# Patient Record
Sex: Female | Born: 1975
Health system: Southern US, Community
[De-identification: ages and names within clinical notes are randomized; demographics above are authoritative.]

## PROBLEM LIST (undated history)

## (undated) ENCOUNTER — Inpatient Hospital Stay (HOSPITAL_COMMUNITY): Payer: Medicaid Other

## (undated) ENCOUNTER — Inpatient Hospital Stay (HOSPITAL_COMMUNITY): Payer: Self-pay

## (undated) DIAGNOSIS — I82409 Acute embolism and thrombosis of unspecified deep veins of unspecified lower extremity: Secondary | ICD-10-CM

## (undated) DIAGNOSIS — E669 Obesity, unspecified: Secondary | ICD-10-CM

## (undated) DIAGNOSIS — R109 Unspecified abdominal pain: Secondary | ICD-10-CM

## (undated) DIAGNOSIS — I4729 Other ventricular tachycardia: Secondary | ICD-10-CM

## (undated) DIAGNOSIS — I2510 Atherosclerotic heart disease of native coronary artery without angina pectoris: Secondary | ICD-10-CM

## (undated) DIAGNOSIS — I5020 Unspecified systolic (congestive) heart failure: Secondary | ICD-10-CM

## (undated) DIAGNOSIS — R0902 Hypoxemia: Secondary | ICD-10-CM

## (undated) DIAGNOSIS — G4730 Sleep apnea, unspecified: Secondary | ICD-10-CM

## (undated) DIAGNOSIS — D649 Anemia, unspecified: Secondary | ICD-10-CM

## (undated) DIAGNOSIS — Z95811 Presence of heart assist device: Secondary | ICD-10-CM

## (undated) DIAGNOSIS — L732 Hidradenitis suppurativa: Secondary | ICD-10-CM

## (undated) DIAGNOSIS — I10 Essential (primary) hypertension: Secondary | ICD-10-CM

## (undated) DIAGNOSIS — G4733 Obstructive sleep apnea (adult) (pediatric): Secondary | ICD-10-CM

## (undated) DIAGNOSIS — S82892A Other fracture of left lower leg, initial encounter for closed fracture: Secondary | ICD-10-CM

## (undated) DIAGNOSIS — D219 Benign neoplasm of connective and other soft tissue, unspecified: Secondary | ICD-10-CM

## (undated) DIAGNOSIS — I428 Other cardiomyopathies: Secondary | ICD-10-CM

## (undated) DIAGNOSIS — I2699 Other pulmonary embolism without acute cor pulmonale: Secondary | ICD-10-CM

## (undated) DIAGNOSIS — Z5189 Encounter for other specified aftercare: Secondary | ICD-10-CM

## (undated) DIAGNOSIS — I219 Acute myocardial infarction, unspecified: Secondary | ICD-10-CM

## (undated) DIAGNOSIS — Z9989 Dependence on other enabling machines and devices: Secondary | ICD-10-CM

## (undated) DIAGNOSIS — Z3490 Encounter for supervision of normal pregnancy, unspecified, unspecified trimester: Secondary | ICD-10-CM

## (undated) DIAGNOSIS — I340 Nonrheumatic mitral (valve) insufficiency: Secondary | ICD-10-CM

## (undated) DIAGNOSIS — I5042 Chronic combined systolic (congestive) and diastolic (congestive) heart failure: Secondary | ICD-10-CM

## (undated) DIAGNOSIS — D689 Coagulation defect, unspecified: Secondary | ICD-10-CM

## (undated) DIAGNOSIS — F17200 Nicotine dependence, unspecified, uncomplicated: Secondary | ICD-10-CM

## (undated) HISTORY — DX: Essential (primary) hypertension: I10

## (undated) HISTORY — DX: Hidradenitis suppurativa: L73.2

## (undated) HISTORY — DX: Nonrheumatic mitral (valve) insufficiency: I34.0

## (undated) HISTORY — DX: Encounter for other specified aftercare: Z51.89

## (undated) HISTORY — DX: Acute myocardial infarction, unspecified: I21.9

## (undated) HISTORY — DX: Unspecified abdominal pain: R10.9

## (undated) HISTORY — DX: Atherosclerotic heart disease of native coronary artery without angina pectoris: I25.10

## (undated) HISTORY — DX: Hypoxemia: R09.02

## (undated) HISTORY — DX: Sleep apnea, unspecified: G47.30

## (undated) HISTORY — DX: Anemia, unspecified: D64.9

## (undated) HISTORY — DX: Other cardiomyopathies: I42.8

## (undated) HISTORY — DX: Other ventricular tachycardia: I47.29

## (undated) HISTORY — DX: Coagulation defect, unspecified: D68.9

## (undated) HISTORY — DX: Encounter for supervision of normal pregnancy, unspecified, unspecified trimester: Z34.90

## (undated) HISTORY — PX: CYST REMOVAL TRUNK: SHX6283

## (undated) HISTORY — DX: Chronic combined systolic (congestive) and diastolic (congestive) heart failure: I50.42

## (undated) HISTORY — PX: IVC FILTER PLACEMENT (ARMC HX): HXRAD1551

---

## 2001-08-25 ENCOUNTER — Emergency Department (HOSPITAL_COMMUNITY): Admission: EM | Admit: 2001-08-25 | Discharge: 2001-08-25 | Payer: Self-pay | Admitting: Emergency Medicine

## 2001-12-06 ENCOUNTER — Emergency Department (HOSPITAL_COMMUNITY): Admission: EM | Admit: 2001-12-06 | Discharge: 2001-12-06 | Payer: Self-pay | Admitting: *Deleted

## 2011-09-04 ENCOUNTER — Emergency Department (HOSPITAL_COMMUNITY)
Admission: EM | Admit: 2011-09-04 | Discharge: 2011-09-04 | Disposition: A | Discharge status: Home / Self Care | Payer: Self-pay | Attending: Emergency Medicine | Admitting: Emergency Medicine

## 2011-09-04 ENCOUNTER — Encounter (HOSPITAL_COMMUNITY): Payer: Self-pay | Admitting: *Deleted

## 2011-09-04 ENCOUNTER — Encounter (HOSPITAL_COMMUNITY): Payer: Self-pay

## 2011-09-04 DIAGNOSIS — L02219 Cutaneous abscess of trunk, unspecified: Secondary | ICD-10-CM | POA: Insufficient documentation

## 2011-09-04 DIAGNOSIS — R220 Localized swelling, mass and lump, head: Secondary | ICD-10-CM | POA: Insufficient documentation

## 2011-09-04 DIAGNOSIS — F17200 Nicotine dependence, unspecified, uncomplicated: Secondary | ICD-10-CM | POA: Insufficient documentation

## 2011-09-04 DIAGNOSIS — L0291 Cutaneous abscess, unspecified: Secondary | ICD-10-CM

## 2011-09-04 DIAGNOSIS — T370X5A Adverse effect of sulfonamides, initial encounter: Secondary | ICD-10-CM | POA: Insufficient documentation

## 2011-09-04 DIAGNOSIS — T7840XA Allergy, unspecified, initial encounter: Secondary | ICD-10-CM

## 2011-09-04 MED ORDER — DIPHENHYDRAMINE HCL 50 MG/ML IJ SOLN
50.0000 mg | Freq: Once | INTRAMUSCULAR | Status: AC
  Administered 2011-09-04: 50 mg via INTRAVENOUS
  Filled 2011-09-04: qty 1

## 2011-09-04 MED ORDER — LIDOCAINE HCL (PF) 1 % IJ SOLN
5.0000 mL | Freq: Once | INTRAMUSCULAR | Status: AC
  Administered 2011-09-04: 5 mL via INTRADERMAL
  Filled 2011-09-04: qty 5

## 2011-09-04 MED ORDER — METHYLPREDNISOLONE SODIUM SUCC 125 MG IJ SOLR
125.0000 mg | Freq: Once | INTRAMUSCULAR | Status: AC
  Administered 2011-09-04: 125 mg via INTRAVENOUS
  Filled 2011-09-04: qty 2

## 2011-09-04 MED ORDER — METHYLPREDNISOLONE SODIUM SUCC 125 MG IJ SOLR: 125.0000 mg | Freq: Once | INTRAMUSCULAR | Status: DC

## 2011-09-04 MED ORDER — SULFAMETHOXAZOLE-TRIMETHOPRIM 800-160 MG PO TABS: 1.0000 | ORAL_TABLET | Freq: Two times a day (BID) | ORAL | Status: DC

## 2011-09-04 MED ORDER — DOXYCYCLINE HYCLATE 100 MG PO CAPS: 100.0000 mg | ORAL_CAPSULE | Freq: Two times a day (BID) | ORAL | Status: AC

## 2011-09-04 MED ORDER — HYDROCODONE-ACETAMINOPHEN 5-325 MG PO TABS
1.0000 | ORAL_TABLET | Freq: Once | ORAL | Status: AC
  Administered 2011-09-04: 1 via ORAL
  Filled 2011-09-04: qty 1

## 2011-09-04 MED ORDER — PREDNISONE 10 MG PO TABS: 20.0000 mg | ORAL_TABLET | Freq: Every day | ORAL | Status: DC

## 2011-09-04 MED ORDER — HYDROCODONE-ACETAMINOPHEN 5-325 MG PO TABS
ORAL_TABLET | ORAL | Status: AC
Start: 2011-09-04 — End: 2011-09-14

## 2011-09-04 MED ORDER — DIPHENHYDRAMINE HCL 12.5 MG/5ML PO ELIX: 50.0000 mg | ORAL_SOLUTION | Freq: Once | ORAL | Status: DC

## 2011-09-04 MED ORDER — SULFAMETHOXAZOLE-TMP DS 800-160 MG PO TABS
1.0000 | ORAL_TABLET | Freq: Once | ORAL | Status: AC
  Administered 2011-09-04: 1 via ORAL
  Filled 2011-09-04: qty 1

## 2011-09-04 NOTE — ED Provider Notes (Signed)
History     CSN: [id number]  Arrival date & time 09/04/11  1243   First MD Initiated Contact with Patient 09/04/11 1408      Chief Complaint  Patient presents with  . Allergic Reaction    (Consider location/radiation/quality/duration/timing/severity/associated sxs/prior treatment) Patient is a 36 y.o. female presenting with allergic reaction. The history is provided by the patient (pt took bactrim today and had swelling to lips and face). No language interpreter was used.  Allergic Reaction The primary symptoms do not include wheezing, cough, abdominal pain or diarrhea. The current episode started less than 1 hour ago. The problem has been gradually improving. This is a new problem.  Associated with: nothing. Significant symptoms also include eye redness.    History reviewed. No pertinent past medical history.  History reviewed. No pertinent past surgical history.  History reviewed. No pertinent family history.  History  Substance Use Topics  . Smoking status: Current Everyday Smoker  . Smokeless tobacco: Not on file  . Alcohol Use: Yes    OB History    Grav Para Term Preterm Abortions TAB SAB Ect Mult Living                  Review of Systems  Constitutional: Negative for fatigue.  HENT: Positive for facial swelling. Negative for congestion, sinus pressure and ear discharge.   Eyes: Positive for redness. Negative for discharge.  Respiratory: Negative for cough and wheezing.   Cardiovascular: Negative for chest pain.  Gastrointestinal: Negative for abdominal pain and diarrhea.  Genitourinary: Negative for frequency and hematuria.  Musculoskeletal: Negative for back pain.  Neurological: Negative for seizures and headaches.  Hematological: Negative.   Psychiatric/Behavioral: Negative for hallucinations.    Allergies  Review of patient's allergies indicates no known allergies.  Home Medications   Current Outpatient Rx  Name Route Sig Dispense Refill  .  DOXYCYCLINE HYCLATE 100 MG PO CAPS Oral Take 1 capsule (100 mg total) by mouth 2 (two) times daily. 20 capsule 0  . HYDROCODONE-ACETAMINOPHEN 5-325 MG PO TABS  Take one-two tabs po q 4-6 hrs prn pain 24 tablet 0  . PREDNISONE 10 MG PO TABS Oral Take 2 tablets (20 mg total) by mouth daily. 15 tablet 0  . SULFAMETHOXAZOLE-TRIMETHOPRIM 800-160 MG PO TABS Oral Take 1 tablet by mouth 2 (two) times daily. 28 tablet 0    BP 141/80  Pulse 76  Temp 99 F (37.2 C) (Oral)  Resp 18  SpO2 100%  LMP 08/12/2011  Physical Exam  Constitutional: She is oriented to person, place, and time. She appears well-developed.  HENT:  Head: Normocephalic and atraumatic.       Swelling to face and lips. Oral pharnx normal  Eyes: Conjunctivae and EOM are normal. No scleral icterus.  Neck: Neck supple. No thyromegaly present.  Cardiovascular: Normal rate and regular rhythm.  Exam reveals no gallop and no friction rub.   No murmur heard. Pulmonary/Chest: No stridor. She has no wheezes. She has no rales. She exhibits no tenderness.  Abdominal: She exhibits no distension. There is no tenderness. There is no rebound.  Musculoskeletal: Normal range of motion. She exhibits no edema.  Lymphadenopathy:    She has no cervical adenopathy.  Neurological: She is oriented to person, place, and time. Coordination normal.  Psychiatric: She has a normal mood and affect. Her behavior is normal.    ED Course  Procedures (including critical care time)  Labs Reviewed - No data to display No results found.  1. Allergic reaction     Pt stated she would not stay to be observed  MDM          Benny Lennert, MD 09/04/11 [phone]

## 2011-09-04 NOTE — ED Notes (Signed)
Boil to rt breast.

## 2011-09-04 NOTE — ED Notes (Signed)
Seen here today with I and D of rt breast, Now swelling rt side of face  And itching of arms.  Alert.

## 2011-09-04 NOTE — ED Provider Notes (Signed)
History     CSN: [id number]  Arrival date & time 09/04/11  4098   First MD Initiated Contact with Patient 09/04/11 1030      Chief Complaint  Patient presents with  . Recurrent Skin Infections    (Consider location/radiation/quality/duration/timing/severity/associated sxs/prior treatment) HPI Comments: Patient c/o painful area of redness and swelling under her right breast.  Pain is worse with palpation and movement.  She denies fever, chill, vomiting or rash.  States she has been trying OTC medications w/o relief.    Patient is a 36 y.o. female presenting with abscess. The history is provided by the patient.  Abscess  This is a new problem. The current episode started less than one week ago. The onset was gradual. The problem occurs continuously. Affected Location: right breast. The problem is moderate. The abscess is characterized by painfulness, swelling and redness. Pertinent negatives include no fever and no vomiting. Her past medical history does not include skin abscesses in family. There were no sick contacts. She has received no recent medical care.    History reviewed. No pertinent past medical history.  History reviewed. No pertinent past surgical history.  No family history on file.  History  Substance Use Topics  . Smoking status: Current Everyday Smoker  . Smokeless tobacco: Not on file  . Alcohol Use: Yes    OB History    Grav Para Term Preterm Abortions TAB SAB Ect Mult Living                  Review of Systems  Constitutional: Negative for fever and chills.  Respiratory: Negative for shortness of breath.   Gastrointestinal: Negative for nausea, vomiting and abdominal pain.  Musculoskeletal: Negative for joint swelling and arthralgias.  Skin: Positive for color change.       Abscess   Hematological: Negative for adenopathy.  All other systems reviewed and are negative.    Allergies  Review of patient's allergies indicates no known  allergies.  Home Medications  No current outpatient prescriptions on file.  BP 169/97  Pulse 93  Temp 98.3 F (36.8 C)  Resp 17  Ht 6' (1.829 m)  Wt 305 lb (138.347 kg)  BMI 41.37 kg/m2  SpO2 100%  LMP 08/12/2011  Physical Exam  Nursing note and vitals reviewed. Constitutional: She is oriented to person, place, and time. She appears well-developed and well-nourished. No distress.       Morbidly obese  HENT:  Head: Normocephalic and atraumatic.  Neck: Normal range of motion. Neck supple.  Cardiovascular: Normal rate, regular rhythm, normal heart sounds and intact distal pulses.   No murmur heard. Pulmonary/Chest: Effort normal and breath sounds normal. No respiratory distress.  Musculoskeletal: She exhibits no edema.  Neurological: She is alert and oriented to person, place, and time. She exhibits normal muscle tone. Coordination normal.  Skin: Skin is warm. There is erythema.       Abscess to the skin fold underneath the right breast.  Moderate amt of induration and erythema.  No drainage    ED Course  Procedures (including critical care time)  Labs Reviewed - No data to display      MDM    INCISION AND DRAINAGE Performed by: Maxwell Caul. Consent: Verbal consent obtained. Risks and benefits: risks, benefits and alternatives were discussed Type: abscess  Body area: intertrigo fold of right breast Anesthesia: local infiltration  Local anesthetic: lidocaine 1% w/o epinephrine  Anesthetic total: 4ml  Complexity: complex Blunt dissection to break up  loculations  Drainage: purulent  Drainage amount: copious   Packing material: 1/4 in iodoform gauze  Patient tolerance: Patient tolerated the procedure well with no immediate complications.    Pt is obese, well appearing.  Agrees to return here in 2 days for recheck and packing removal.  Also has small indurated area to the left lower back that appears c/w early abscess.  Discussed with pt that it may  also need I&D if not improving with abx.  The patient appears reasonably screened and/or stabilized for discharge and I doubt any other medical condition or other [facility] requiring further screening, evaluation, or treatment in the ED at this time prior to discharge.   Prescribed:  Bactrim DS norco #24   Joriel Streety L. Montray Kliebert, [place] 09/08/11 1113

## 2011-09-07 ENCOUNTER — Encounter (HOSPITAL_COMMUNITY): Payer: Self-pay | Admitting: *Deleted

## 2011-09-07 ENCOUNTER — Emergency Department (HOSPITAL_COMMUNITY)
Admission: EM | Admit: 2011-09-07 | Discharge: 2011-09-07 | Disposition: A | Discharge status: Home / Self Care | Payer: Self-pay | Attending: Emergency Medicine | Admitting: Emergency Medicine

## 2011-09-07 DIAGNOSIS — Z5189 Encounter for other specified aftercare: Secondary | ICD-10-CM

## 2011-09-07 DIAGNOSIS — N61 Mastitis without abscess: Secondary | ICD-10-CM | POA: Insufficient documentation

## 2011-09-07 DIAGNOSIS — Z880 Allergy status to penicillin: Secondary | ICD-10-CM | POA: Insufficient documentation

## 2011-09-07 DIAGNOSIS — Z4801 Encounter for change or removal of surgical wound dressing: Secondary | ICD-10-CM | POA: Insufficient documentation

## 2011-09-07 MED ORDER — LIDOCAINE HCL (PF) 1 % IJ SOLN
INTRAMUSCULAR | Status: AC
  Administered 2011-09-07: 2.1 mL
  Filled 2011-09-07: qty 5

## 2011-09-07 MED ORDER — ONDANSETRON HCL 4 MG PO TABS
4.0000 mg | ORAL_TABLET | Freq: Once | ORAL | Status: AC
  Administered 2011-09-07: 4 mg via ORAL
  Filled 2011-09-07: qty 1

## 2011-09-07 MED ORDER — CEFTRIAXONE SODIUM 1 G IJ SOLR
1.0000 g | Freq: Once | INTRAMUSCULAR | Status: AC
  Administered 2011-09-07: 1 g via INTRAMUSCULAR
  Filled 2011-09-07: qty 10

## 2011-09-07 NOTE — ED Provider Notes (Signed)
Medical screening examination/treatment/procedure(s) were performed by non-physician practitioner and as supervising physician I was immediately available for consultation/collaboration.   Jaevon Paras M Betha Shadix, DO 09/07/11 1656 

## 2011-09-07 NOTE — ED Notes (Signed)
Pt had I&D under right breast on Wednesday. Here to get packing removed.

## 2011-09-07 NOTE — ED Provider Notes (Signed)
History     CSN: [id number]  Arrival date & time 09/07/11  1106   First MD Initiated Contact with Patient 09/07/11 1315      Chief Complaint  Patient presents with  . Wound Check    (Consider location/radiation/quality/duration/timing/severity/associated sxs/prior treatment) Patient is a 36 y.o. female presenting with wound check. The history is provided by the patient.  Wound Check  She was treated in the ED 2 to 3 days ago. Previous treatment in the ED includes I&D of abscess. Treatments since wound repair include oral antibiotics. Fever duration: none. There has been colored discharge from the wound. The swelling has improved. The pain has not changed.    History reviewed. No pertinent past medical history.  History reviewed. No pertinent past surgical history.  History reviewed. No pertinent family history.  History  Substance Use Topics  . Smoking status: Current Everyday Smoker  . Smokeless tobacco: Not on file  . Alcohol Use: Yes    OB History    Grav Para Term Preterm Abortions TAB SAB Ect Mult Living                  Review of Systems  Constitutional: Negative for activity change.       All ROS Neg except as noted in HPI  HENT: Negative for nosebleeds and neck pain.   Eyes: Negative for photophobia and discharge.  Respiratory: Negative for cough, shortness of breath and wheezing.   Cardiovascular: Negative for chest pain and palpitations.  Gastrointestinal: Negative for abdominal pain and blood in stool.  Genitourinary: Negative for dysuria, frequency and hematuria.  Musculoskeletal: Negative for back pain and arthralgias.  Skin: Positive for wound.  Neurological: Negative for dizziness, seizures and speech difficulty.  Psychiatric/Behavioral: Negative for hallucinations and confusion.    Allergies  Sulfa antibiotics  Home Medications   Current Outpatient Rx  Name Route Sig Dispense Refill  . DOXYCYCLINE HYCLATE 100 MG PO CAPS Oral Take 1 capsule  (100 mg total) by mouth 2 (two) times daily. 20 capsule 0  . HYDROCODONE-ACETAMINOPHEN 5-325 MG PO TABS  Take one-two tabs po q 4-6 hrs prn pain 24 tablet 0    BP 139/74  Pulse 91  Temp 98.3 F (36.8 C) (Oral)  Resp 20  Ht 6' (1.829 m)  Wt 305 lb (138.347 kg)  BMI 41.37 kg/m2  SpO2 100%  LMP 08/12/2011  Physical Exam  Nursing note and vitals reviewed. Constitutional: She is oriented to person, place, and time. She appears well-developed and well-nourished.  Non-toxic appearance.  HENT:  Head: Normocephalic.  Right Ear: Tympanic membrane and external ear normal.  Left Ear: Tympanic membrane and external ear normal.  Eyes: EOM and lids are normal. Pupils are equal, round, and reactive to light.  Neck: Normal range of motion. Neck supple. Carotid bruit is not present.  Cardiovascular: Normal rate, regular rhythm, normal heart sounds, intact distal pulses and normal pulses.   Pulmonary/Chest: Breath sounds normal. No respiratory distress.       The abscess under the right breast remains tender to palpation. There is mild to moderate drainage present. The packing remains in place. There is some mild redness of the right breast. The area is not hot. There is no drainage from the nipple area.  Abdominal: Soft. Bowel sounds are normal. There is no tenderness. There is no guarding.  Musculoskeletal: Normal range of motion.  Lymphadenopathy:       Head (right side): No submandibular adenopathy present.  Head (left side): No submandibular adenopathy present.    She has no cervical adenopathy.  Neurological: She is alert and oriented to person, place, and time. She has normal strength. No cranial nerve deficit or sensory deficit.  Skin: Skin is warm and dry.  Psychiatric: She has a normal mood and affect. Her speech is normal.    ED Course  Procedures (including critical care time) packing from the abscess under the right breast removed by me. Sterile dressing applied by me.  Labs  Reviewed - No data to display No results found.   1. Wound check, abscess       MDM  I have reviewed nursing notes, vital signs, and all appropriate lab and imaging results for this patient. The patient underwent incision and drainage of an abscess under the right breast on Wednesday, August 14. She was placed on antibiotics (doxycycline). The patient has progressed nicely since the incision and drainage. The patient denies any fever or chills nausea or vomiting. The abscess area remains quite tender. There are a few areas of redness of the breast on the right. Robaxin 1 g given intramuscularly today. Patient advised to finish her doxycycline. She is to return if any changes or problems.       Kathie Dike, [place] 09/07/11 1359

## 2011-09-08 NOTE — ED Provider Notes (Signed)
Medical screening examination/treatment/procedure(s) were performed by non-physician practitioner and as supervising physician I was immediately available for consultation/collaboration.   Danyiel Crespin L Jaylene Arrowood, MD 09/08/11 1234 

## 2012-05-02 ENCOUNTER — Emergency Department (HOSPITAL_COMMUNITY)
Admission: EM | Admit: 2012-05-02 | Discharge: 2012-05-02 | Disposition: A | Discharge status: Home / Self Care | Payer: Self-pay | Attending: Emergency Medicine | Admitting: Emergency Medicine

## 2012-05-02 ENCOUNTER — Encounter (HOSPITAL_COMMUNITY): Payer: Self-pay | Admitting: *Deleted

## 2012-05-02 DIAGNOSIS — N61 Mastitis without abscess: Secondary | ICD-10-CM | POA: Insufficient documentation

## 2012-05-02 DIAGNOSIS — N611 Abscess of the breast and nipple: Secondary | ICD-10-CM

## 2012-05-02 DIAGNOSIS — R509 Fever, unspecified: Secondary | ICD-10-CM | POA: Insufficient documentation

## 2012-05-02 DIAGNOSIS — F17200 Nicotine dependence, unspecified, uncomplicated: Secondary | ICD-10-CM | POA: Insufficient documentation

## 2012-05-02 MED ORDER — OXYCODONE-ACETAMINOPHEN 5-325 MG PO TABS
2.0000 | ORAL_TABLET | Freq: Once | ORAL | Status: AC
  Administered 2012-05-02: 2 via ORAL
  Filled 2012-05-02: qty 2

## 2012-05-02 MED ORDER — CLINDAMYCIN HCL 150 MG PO CAPS: 150.0000 mg | ORAL_CAPSULE | Freq: Four times a day (QID) | ORAL | Status: DC

## 2012-05-02 MED ORDER — LIDOCAINE-EPINEPHRINE (PF) 1 %-1:200000 IJ SOLN
INTRAMUSCULAR | Status: AC
  Administered 2012-05-02: 8 mL
  Filled 2012-05-02: qty 10

## 2012-05-02 MED ORDER — OXYCODONE-ACETAMINOPHEN 5-325 MG PO TABS: 1.0000 | ORAL_TABLET | ORAL | Status: DC | PRN

## 2012-05-02 MED ORDER — DOXYCYCLINE HYCLATE 100 MG PO TABS
100.0000 mg | ORAL_TABLET | Freq: Once | ORAL | Status: DC
  Administered 2012-05-02: 100 mg via ORAL
  Filled 2012-05-02: qty 1

## 2012-05-02 MED ORDER — DOXYCYCLINE HYCLATE 100 MG PO CAPS: 100.0000 mg | ORAL_CAPSULE | Freq: Two times a day (BID) | ORAL | Status: DC

## 2012-05-02 MED ORDER — HYDROMORPHONE HCL PF 2 MG/ML IJ SOLN
2.0000 mg | Freq: Once | INTRAMUSCULAR | Status: DC
  Administered 2012-05-02: 2 mg via INTRAMUSCULAR
  Filled 2012-05-02: qty 1

## 2012-05-02 NOTE — ED Notes (Signed)
Abscess under R breast x 10 days.  Has used warm soaks, drained a little, now stopped.  Has had abscess in same place before.

## 2012-05-02 NOTE — ED Provider Notes (Signed)
History  This chart was scribed for Joya Gaskins, MD by Ardeen Jourdain, ED Scribe. This patient was seen in room APA15/APA15 and the patient's care was started at 1059.  CSN: [id number]  Arrival date & time 05/02/12  1046   First MD Initiated Contact with Patient 05/02/12 1059      Chief Complaint  Patient presents with  . Lung Abcess     Patient is a 37 y.o. female presenting with abscess. The history is provided by the patient. No language interpreter was used.  Abscess Location:  Torso Torso abscess location:  Abd LUQ Abscess quality: draining, painful and redness   Duration:  10 days Progression:  Worsening Pain details:    Quality:  Throbbing   Severity:  Moderate   Duration:  10 days   Timing:  Constant   Progression:  Worsening Chronicity:  Recurrent Relieved by:  Nothing Worsened by:  Draining/squeezing Ineffective treatments:  Warm water soaks Associated symptoms: fever   Associated symptoms: no headaches, no nausea and no vomiting   Risk factors: prior abscess     [name] [name] is a 37 y.o. female who presents to the Emergency Department complaining of a gradual onset, gradually worsening, constant abscess under her right breast that began 10 days ago. She has associated pain and drainage from the area. She reports using a warm soak with no relief. Pt has a h/o similar abscesses.   PMH -none  History reviewed. No pertinent past surgical history.  History reviewed. No pertinent family history.  History  Substance Use Topics  . Smoking status: Current Every Day Smoker  . Smokeless tobacco: Not on file  . Alcohol Use: Yes   No OB history available.   Review of Systems  Constitutional: Positive for fever. Negative for chills.  Respiratory: Negative for shortness of breath.   Gastrointestinal: Negative for nausea, vomiting and diarrhea.  Skin:       Abscess   Neurological: Negative for weakness and headaches.  All other systems reviewed and are  negative.    Allergies  Sulfa antibiotics  Home Medications  No current outpatient prescriptions on file.  Triage Vitals: Pulse 92  Temp(Src) 100.2 F (37.9 C) (Oral)  Resp 15  Ht 6' (1.829 m)  Wt 290 lb (131.543 kg)  BMI 39.32 kg/m2  SpO2 100%  LMP 04/07/2012  Physical Exam  CONSTITUTIONAL: Well developed/well nourished HEAD: Normocephalic/atraumatic EYES: EOMI/PERRL ENMT: Mucous membranes moist NECK: supple no meningeal signs SPINE:entire spine nontender CV: S1/S2 noted, no murmurs/rubs/gallops noted LUNGS: Lungs are clear to auscultation bilaterally, no apparent distress ABDOMEN: soft, nontender, no rebound or guarding GU:no cva tenderness NEURO: Pt is awake/alert, moves all extremitiesx4 EXTREMITIES: pulses normal, full ROM SKIN: warm, color normal Large erythematous abscess under right breast that has fluctuance but no crepitance.  Pendulous breast is noted.   Chaperone is present PSYCH: no abnormalities of mood noted  ED Course  Procedures   Needle DRAINAGE Performed by: Joya Gaskins Consent: Verbal consent obtained. Risks and benefits: risks, benefits and alternatives were discussed Type: abscess  Body area: right breast Chaperone present Anesthesia: local infiltration  Local anesthetic: lidocaine 1% with epinephrine  Anesthetic total: 4 ml  Complexity: complex   Drainage: purulent  Drainage amount: large amt of of puse   Patient tolerance: Patient tolerated the procedure well with no immediate complications.     DIAGNOSTIC STUDIES: Oxygen Saturation is 100% on room air, normal by my interpretation.    COORDINATION OF CARE:  11:02  AM-Discussed treatment plan which includes pain medication, antibiotics and I&D with pt at bedside and pt agreed to plan.    Large amt of pus extracted from right breast.  Needle drainage performed to limit scarring Pt tolerated well i advised that if erythema improves and continues to drain she does  not require abx i gave her rx for clinda and doxycycline and can use whichever is more affordable Pt is well appearing and stable for d/c    MDM  Nursing notes including past medical history and social history reviewed and considered in documentation Labs/vital reviewed and considered       I personally performed the services described in this documentation, which was scribed in my presence. The recorded information has been reviewed and is accurate.    Joya Gaskins, MD 05/02/12 [phone]

## 2012-05-02 NOTE — ED Notes (Signed)
EDP aware of pt blood pressure and pt c/o of left leg pain. EDP give verbal order to d/c pt if could bear weight on left leg. Pt able to bear weight on left leg. Pt denies any complaints at d/c. nad noted.

## 2012-05-02 NOTE — ED Notes (Signed)
Abscess dressed with telfa,abdominal pad and medipore used to secure bandage. Pt tolerated well.

## 2012-05-02 NOTE — ED Notes (Addendum)
EDP in to see pt for initial assessment. Pt with abscess to underside of R breast.

## 2012-08-31 ENCOUNTER — Emergency Department (HOSPITAL_COMMUNITY): Payer: Worker's Compensation

## 2012-08-31 ENCOUNTER — Encounter (HOSPITAL_COMMUNITY): Payer: Self-pay | Admitting: *Deleted

## 2012-08-31 ENCOUNTER — Emergency Department (HOSPITAL_COMMUNITY)
Admission: EM | Admit: 2012-08-31 | Discharge: 2012-08-31 | Disposition: A | Discharge status: Home / Self Care | Payer: Worker's Compensation | Attending: Emergency Medicine | Admitting: Emergency Medicine

## 2012-08-31 DIAGNOSIS — Y9269 Other specified industrial and construction area as the place of occurrence of the external cause: Secondary | ICD-10-CM | POA: Insufficient documentation

## 2012-08-31 DIAGNOSIS — W010XXA Fall on same level from slipping, tripping and stumbling without subsequent striking against object, initial encounter: Secondary | ICD-10-CM | POA: Insufficient documentation

## 2012-08-31 DIAGNOSIS — S82899A Other fracture of unspecified lower leg, initial encounter for closed fracture: Secondary | ICD-10-CM | POA: Insufficient documentation

## 2012-08-31 DIAGNOSIS — Y9389 Activity, other specified: Secondary | ICD-10-CM | POA: Insufficient documentation

## 2012-08-31 DIAGNOSIS — F17200 Nicotine dependence, unspecified, uncomplicated: Secondary | ICD-10-CM | POA: Insufficient documentation

## 2012-08-31 DIAGNOSIS — X500XXA Overexertion from strenuous movement or load, initial encounter: Secondary | ICD-10-CM | POA: Insufficient documentation

## 2012-08-31 DIAGNOSIS — S82402A Unspecified fracture of shaft of left fibula, initial encounter for closed fracture: Secondary | ICD-10-CM

## 2012-08-31 MED ORDER — OXYCODONE-ACETAMINOPHEN 5-325 MG PO TABS
1.0000 | ORAL_TABLET | Freq: Once | ORAL | Status: AC
  Administered 2012-08-31: 1 via ORAL
  Filled 2012-08-31: qty 1

## 2012-08-31 MED ORDER — OXYCODONE-ACETAMINOPHEN 5-325 MG PO TABS: 2.0000 | ORAL_TABLET | ORAL | Status: DC | PRN

## 2012-08-31 NOTE — ED Provider Notes (Signed)
CSN: [id number]     Arrival date & time 08/31/12  4098 History     First MD Initiated Contact with Patient 08/31/12 0912     Chief Complaint  Patient presents with  . Ankle Pain   (Consider location/radiation/quality/duration/timing/severity/associated sxs/prior Treatment) Patient is a 37 y.o. female presenting with ankle pain. The history is provided by the patient.  Ankle Pain Location:  Ankle Injury: yes   Mechanism of injury: fall   Fall:    Fall occurred:  Standing   Entrapped after fall: no   Ankle location:  L ankle Pain details:    Quality:  Shooting and sharp   Severity:  Severe   Onset quality:  Sudden   Duration:  12 hours   Timing:  Constant   Progression:  Unchanged Chronicity:  New Foreign body present:  No foreign bodies Prior injury to area:  No Relieved by:  Nothing Worsened by:  Bearing weight Ineffective treatments:  Ice and acetaminophen Associated symptoms: no fever and no neck pain    LORETTA DOUTT is a 37 y.o. female who presents to the ED with left ankle pain. She was at the water fountain at work yesterday and slipped on water and fell. She felt a pop in her left ankle. She went home applied ice and elevated all night. This morning swelling and pain has increased. She denies any other injuries.   History reviewed. No pertinent past medical history. History reviewed. No pertinent past surgical history. No family history on file. History  Substance Use Topics  . Smoking status: Current Every Day Smoker    Types: Cigarettes  . Smokeless tobacco: Not on file  . Alcohol Use: Yes   OB History   Grav Para Term Preterm Abortions TAB SAB Ect Mult Living                 Review of Systems  Constitutional: Negative for fever and chills.  HENT: Negative for neck pain.   Gastrointestinal: Negative for nausea and vomiting.  Musculoskeletal:       Left ankle pain  Skin: Negative for wound.  Neurological: Negative for dizziness and headaches.   Psychiatric/Behavioral: The patient is not nervous/anxious.     Allergies  Sulfa antibiotics  Home Medications   Current Outpatient Rx  Name  Route  Sig  Dispense  Refill  . acetaminophen (TYLENOL) 325 MG tablet   Oral   Take 650 mg by mouth every 6 (six) hours as needed for pain.          BP 170/94  Pulse 85  Temp(Src) 98.4 F (36.9 C) (Oral)  Resp 16  SpO2 100%  LMP 08/10/2012 Physical Exam  Nursing note and vitals reviewed. Constitutional: She is oriented to person, place, and time. She appears well-developed and well-nourished. No distress.  HENT:  Head: Normocephalic.  Eyes: EOM are normal.  Neck: Neck supple.  Cardiovascular: Normal rate.   Pulmonary/Chest: Effort normal.  Musculoskeletal:       Left ankle: She exhibits decreased range of motion and swelling. She exhibits normal pulse. Tenderness. Lateral malleolus tenderness found. Achilles tendon normal.  Neurological: She is alert and oriented to person, place, and time. No cranial nerve deficit.  Skin: Skin is warm and dry.  Psychiatric: She has a normal mood and affect. Her behavior is normal.   Dg Ankle Complete Left  08/31/2012   *RADIOLOGY REPORT*  Clinical Data: Left ankle pain after fall  LEFT ANKLE COMPLETE - 3+ VIEW  Comparison: None.  Findings: Mildly displaced oblique fracture of distal fibula is noted with overlying soft tissue swelling.  Joint spaces appear to be intact.  IMPRESSION: Mildly displaced distal fibular fracture.   Original Report Authenticated By: Lupita Raider.,  M.D.    Procedures  MDM  37 y.o. female with fracture of left fibula, Cam walker applied, crutches, ice, elevation.  I have reviewed this patient's vital signs, nurses notes, appropriate imaging and discussed findings with the patient and plan of care. She voices understanding.     Medication List    TAKE these medications       oxyCODONE-acetaminophen 5-325 MG per tablet  Commonly known as:  PERCOCET/ROXICET   Take 2 tablets by mouth every 4 (four) hours as needed for pain.      ASK your doctor about these medications       acetaminophen 325 MG tablet  Commonly known as:  TYLENOL  Take 650 mg by mouth every 6 (six) hours as needed for pain.           [facility] Orlene Och, NP 08/31/12 1026

## 2012-08-31 NOTE — ED Notes (Signed)
Pain and swelling to left ankle after fall yesterday.  Took tylenol x 2 PTA.

## 2012-08-31 NOTE — Progress Notes (Signed)
ED Note ED/CM noted pt did not have health insurance, and/or PCP. Patient was given the ED  [facility] uninsured handout  with information for the clinics, food pantries, and the handout for insurance signup. Patient expressed  appreciation for this.

## 2012-08-31 NOTE — ED Provider Notes (Signed)
Medical screening examination/treatment/procedure(s) were conducted as a shared visit with non-physician practitioner(s) and myself.  I personally evaluated the patient during the encounter.  Discussed left fibular fracture with patient and her family. Walking boot, crutches, pain management referral to orthopedics  Donnetta Hutching, MD 08/31/12 1450

## 2013-04-21 DIAGNOSIS — I2699 Other pulmonary embolism without acute cor pulmonale: Secondary | ICD-10-CM

## 2013-04-21 HISTORY — DX: Other pulmonary embolism without acute cor pulmonale: I26.99

## 2013-04-27 ENCOUNTER — Emergency Department (HOSPITAL_COMMUNITY): Payer: Self-pay

## 2013-04-27 ENCOUNTER — Encounter (HOSPITAL_COMMUNITY): Payer: Self-pay | Admitting: Emergency Medicine

## 2013-04-27 ENCOUNTER — Inpatient Hospital Stay (HOSPITAL_COMMUNITY)
Admission: EM | Admit: 2013-04-27 | Discharge: 2013-04-29 | DRG: 166 | Disposition: A | Discharge status: Home / Self Care | Payer: Self-pay | Attending: Internal Medicine | Admitting: Internal Medicine

## 2013-04-27 DIAGNOSIS — R0781 Pleurodynia: Secondary | ICD-10-CM | POA: Diagnosis present

## 2013-04-27 DIAGNOSIS — Z86711 Personal history of pulmonary embolism: Secondary | ICD-10-CM

## 2013-04-27 DIAGNOSIS — R0602 Shortness of breath: Secondary | ICD-10-CM | POA: Diagnosis present

## 2013-04-27 DIAGNOSIS — R071 Chest pain on breathing: Secondary | ICD-10-CM

## 2013-04-27 DIAGNOSIS — I2699 Other pulmonary embolism without acute cor pulmonale: Secondary | ICD-10-CM | POA: Diagnosis present

## 2013-04-27 DIAGNOSIS — I959 Hypotension, unspecified: Secondary | ICD-10-CM | POA: Diagnosis present

## 2013-04-27 DIAGNOSIS — I2692 Saddle embolus of pulmonary artery without acute cor pulmonale: Principal | ICD-10-CM | POA: Diagnosis present

## 2013-04-27 DIAGNOSIS — N179 Acute kidney failure, unspecified: Secondary | ICD-10-CM | POA: Diagnosis present

## 2013-04-27 DIAGNOSIS — F17200 Nicotine dependence, unspecified, uncomplicated: Secondary | ICD-10-CM | POA: Diagnosis present

## 2013-04-27 DIAGNOSIS — D649 Anemia, unspecified: Secondary | ICD-10-CM | POA: Diagnosis present

## 2013-04-27 DIAGNOSIS — I2602 Saddle embolus of pulmonary artery with acute cor pulmonale: Secondary | ICD-10-CM | POA: Diagnosis present

## 2013-04-27 DIAGNOSIS — R739 Hyperglycemia, unspecified: Secondary | ICD-10-CM | POA: Diagnosis present

## 2013-04-27 DIAGNOSIS — J9620 Acute and chronic respiratory failure, unspecified whether with hypoxia or hypercapnia: Secondary | ICD-10-CM | POA: Diagnosis present

## 2013-04-27 DIAGNOSIS — I2609 Other pulmonary embolism with acute cor pulmonale: Secondary | ICD-10-CM | POA: Diagnosis present

## 2013-04-27 DIAGNOSIS — Z833 Family history of diabetes mellitus: Secondary | ICD-10-CM

## 2013-04-27 DIAGNOSIS — E669 Obesity, unspecified: Secondary | ICD-10-CM | POA: Diagnosis present

## 2013-04-27 DIAGNOSIS — R7309 Other abnormal glucose: Secondary | ICD-10-CM | POA: Diagnosis present

## 2013-04-27 DIAGNOSIS — Z8249 Family history of ischemic heart disease and other diseases of the circulatory system: Secondary | ICD-10-CM

## 2013-04-27 HISTORY — DX: Other fracture of left lower leg, initial encounter for closed fracture: S82.892A

## 2013-04-27 HISTORY — DX: Nicotine dependence, unspecified, uncomplicated: F17.200

## 2013-04-27 HISTORY — DX: Obesity, unspecified: E66.9

## 2013-04-27 LAB — COMPREHENSIVE METABOLIC PANEL
ALBUMIN: 3.3 g/dL — AB (ref 3.5–5.2)
ALT: 50 U/L — ABNORMAL HIGH (ref 0–35)
AST: 69 U/L — AB (ref 0–37)
Alkaline Phosphatase: 74 U/L (ref 39–117)
BILIRUBIN TOTAL: 0.2 mg/dL — AB (ref 0.3–1.2)
BUN: 9 mg/dL (ref 6–23)
CALCIUM: 8.9 mg/dL (ref 8.4–10.5)
CHLORIDE: 102 meq/L (ref 96–112)
CO2: 17 mEq/L — ABNORMAL LOW (ref 19–32)
CREATININE: 1.17 mg/dL — AB (ref 0.50–1.10)
GFR calc Af Amer: 69 mL/min — ABNORMAL LOW (ref >90)
GFR calc non Af Amer: 59 mL/min — ABNORMAL LOW (ref >90)
Glucose, Bld: 200 mg/dL — ABNORMAL HIGH (ref 70–99)
Potassium: 3.6 mEq/L — ABNORMAL LOW (ref 3.7–5.3)
SODIUM: 140 meq/L (ref 137–147)
Total Protein: 8.3 g/dL (ref 6.0–8.3)

## 2013-04-27 LAB — URINALYSIS, ROUTINE W REFLEX MICROSCOPIC
Bilirubin Urine: NEGATIVE
Glucose, UA: NEGATIVE mg/dL
Hgb urine dipstick: NEGATIVE
KETONES UR: NEGATIVE mg/dL
Leukocytes, UA: NEGATIVE
NITRITE: NEGATIVE
PROTEIN: NEGATIVE mg/dL
Specific Gravity, Urine: <1.005 — ABNORMAL LOW (ref 1.005–1.030)
UROBILINOGEN UA: 0.2 mg/dL (ref 0.0–1.0)
pH: 5.5 (ref 5.0–8.0)

## 2013-04-27 LAB — ETHANOL: Alcohol, Ethyl (B): 94 mg/dL — ABNORMAL HIGH (ref 0–11)

## 2013-04-27 LAB — TROPONIN I

## 2013-04-27 LAB — CBC WITH DIFFERENTIAL/PLATELET
BASOS ABS: 0 10*3/uL (ref 0.0–0.1)
BASOS PCT: 0 % (ref 0–1)
Eosinophils Absolute: 0.1 10*3/uL (ref 0.0–0.7)
Eosinophils Relative: 1 % (ref 0–5)
HEMATOCRIT: 35.1 % — AB (ref 36.0–46.0)
Hemoglobin: 11 g/dL — ABNORMAL LOW (ref 12.0–15.0)
Lymphocytes Relative: 27 % (ref 12–46)
Lymphs Abs: 2.8 10*3/uL (ref 0.7–4.0)
MCH: 25.8 pg — ABNORMAL LOW (ref 26.0–34.0)
MCHC: 31.3 g/dL (ref 30.0–36.0)
MCV: 82.2 fL (ref 78.0–100.0)
MONO ABS: 0.5 10*3/uL (ref 0.1–1.0)
Monocytes Relative: 4 % (ref 3–12)
NEUTROS ABS: 7.2 10*3/uL (ref 1.7–7.7)
Neutrophils Relative %: 68 % (ref 43–77)
PLATELETS: 187 10*3/uL (ref 150–400)
RBC: 4.27 MIL/uL (ref 3.87–5.11)
RDW: 18.2 % — AB (ref 11.5–15.5)
WBC: 10.6 10*3/uL — ABNORMAL HIGH (ref 4.0–10.5)

## 2013-04-27 LAB — RAPID URINE DRUG SCREEN, HOSP PERFORMED
AMPHETAMINES: NOT DETECTED
Barbiturates: NOT DETECTED
Benzodiazepines: NOT DETECTED
Cocaine: NOT DETECTED
OPIATES: NOT DETECTED
Tetrahydrocannabinol: NOT DETECTED

## 2013-04-27 LAB — PRO B NATRIURETIC PEPTIDE: PRO B NATRI PEPTIDE: 48.7 pg/mL (ref 0–125)

## 2013-04-27 LAB — GLUCOSE, CAPILLARY: Glucose-Capillary: 88 mg/dL (ref 70–99)

## 2013-04-27 LAB — MRSA PCR SCREENING: MRSA by PCR: NEGATIVE

## 2013-04-27 LAB — LACTIC ACID, PLASMA: LACTIC ACID, VENOUS: 6.2 mmol/L — AB (ref 0.5–2.2)

## 2013-04-27 LAB — D-DIMER, QUANTITATIVE (NOT AT ARMC): D DIMER QUANT: 9.04 ug{FEU}/mL — AB (ref 0.00–0.48)

## 2013-04-27 LAB — PREGNANCY, URINE: PREG TEST UR: NEGATIVE

## 2013-04-27 MED ORDER — SODIUM CHLORIDE 0.9 % IJ SOLN: 3.0000 mL | INTRAMUSCULAR | Status: DC | PRN

## 2013-04-27 MED ORDER — SODIUM CHLORIDE 0.9 % IJ SOLN: 3.0000 mL | Freq: Two times a day (BID) | INTRAMUSCULAR | Status: DC

## 2013-04-27 MED ORDER — SODIUM CHLORIDE 0.9 % IV BOLUS (SEPSIS)
1000.0000 mL | Freq: Once | INTRAVENOUS | Status: AC
  Administered 2013-04-27: 1000 mL via INTRAVENOUS

## 2013-04-27 MED ORDER — SODIUM CHLORIDE 0.9 % IV SOLN: 250.0000 mL | INTRAVENOUS | Status: DC | PRN

## 2013-04-27 MED ORDER — IOHEXOL 350 MG/ML SOLN
120.0000 mL | Freq: Once | INTRAVENOUS | Status: AC | PRN
  Administered 2013-04-27: 120 mL via INTRAVENOUS

## 2013-04-27 MED ORDER — OXYCODONE HCL 5 MG PO TABS
5.0000 mg | ORAL_TABLET | ORAL | Status: DC | PRN
Start: 2013-04-27 — End: 2013-04-29

## 2013-04-27 MED ORDER — SODIUM CHLORIDE 0.9 % IJ SOLN
3.0000 mL | Freq: Two times a day (BID) | INTRAMUSCULAR | Status: DC
  Administered 2013-04-29: 3 mL via INTRAVENOUS

## 2013-04-27 MED ORDER — HYDROMORPHONE HCL PF 1 MG/ML IJ SOLN
0.5000 mg | INTRAMUSCULAR | Status: DC | PRN
  Administered 2013-04-28: 1 mg via INTRAVENOUS
  Filled 2013-04-27: qty 1

## 2013-04-27 MED ORDER — HEPARIN BOLUS VIA INFUSION
4000.0000 [IU] | Freq: Once | INTRAVENOUS | Status: AC
  Administered 2013-04-27: 4000 [IU] via INTRAVENOUS

## 2013-04-27 MED ORDER — HEPARIN (PORCINE) IN NACL 100-0.45 UNIT/ML-% IJ SOLN
2500.0000 [IU]/h | INTRAMUSCULAR | Status: DC
  Administered 2013-04-27 – 2013-04-28 (×2): 2500 [IU]/h via INTRAVENOUS
  Filled 2013-04-27 (×2): qty 250

## 2013-04-27 MED ORDER — INSULIN ASPART 100 UNIT/ML ~~LOC~~ SOLN: 0.0000 [IU] | Freq: Every day | SUBCUTANEOUS | Status: DC

## 2013-04-27 MED ORDER — SODIUM CHLORIDE 0.9 % IJ SOLN
INTRAMUSCULAR | Status: AC
  Filled 2013-04-27: qty 400

## 2013-04-27 MED ORDER — INSULIN ASPART 100 UNIT/ML ~~LOC~~ SOLN: 0.0000 [IU] | Freq: Three times a day (TID) | SUBCUTANEOUS | Status: DC

## 2013-04-27 MED ORDER — ALUM & MAG HYDROXIDE-SIMETH 200-200-20 MG/5ML PO SUSP: 30.0000 mL | Freq: Four times a day (QID) | ORAL | Status: DC | PRN

## 2013-04-27 MED ORDER — ASPIRIN 81 MG PO CHEW
324.0000 mg | CHEWABLE_TABLET | Freq: Once | ORAL | Status: AC
  Administered 2013-04-27: 324 mg via ORAL
  Filled 2013-04-27: qty 4

## 2013-04-27 MED ORDER — ACETAMINOPHEN 650 MG RE SUPP: 650.0000 mg | Freq: Four times a day (QID) | RECTAL | Status: DC | PRN

## 2013-04-27 MED ORDER — ONDANSETRON HCL 4 MG PO TABS: 4.0000 mg | ORAL_TABLET | Freq: Four times a day (QID) | ORAL | Status: DC | PRN

## 2013-04-27 MED ORDER — WARFARIN SODIUM 10 MG PO TABS
10.0000 mg | ORAL_TABLET | Freq: Once | ORAL | Status: AC
  Administered 2013-04-27: 10 mg via ORAL
  Filled 2013-04-27 (×2): qty 1

## 2013-04-27 MED ORDER — ONDANSETRON HCL 4 MG/2ML IJ SOLN: 4.0000 mg | Freq: Four times a day (QID) | INTRAMUSCULAR | Status: DC | PRN

## 2013-04-27 MED ORDER — WARFARIN - PHARMACIST DOSING INPATIENT: Status: DC

## 2013-04-27 MED ORDER — ACETAMINOPHEN 325 MG PO TABS: 650.0000 mg | ORAL_TABLET | Freq: Four times a day (QID) | ORAL | Status: DC | PRN

## 2013-04-27 NOTE — H&P (Signed)
[facility] History and Physical  [name] [name] WSF:[id number] DOB: 04/12/1975 DOA: 04/27/2013  Referring physician: EDP PCP: No PCP Per Patient  Specialists:   Chief Complaint:   Chest Pain and SOB  HPI: [name] [name] is a 38 y.o. female who presents to the ED with complaints of worsening SOB and pleuritic Chest pain in the early afternoon.   She reports having intermittent episodes of Pleuritic chest pain and SOB for the past 2-3 months.  She was diagnosed with a Pulmonary Embolism after evaluation in the ED and started on an IV Heparin drip and referred for medical admission.   She denies any medical history or family history of clotting disorders, however she does report having 2 miscarriages.   She denies any recent travel, but reports being sedentary since she fractured her left ankle in 08/30/2012.  She is not on any Hormonal Rx, or OCPs, however she is a smoker.         Review of Systems:  Constitutional: No Weight Loss, No Weight Gain, Night Sweats, Fevers, Chills, Fatigue, or Generalized Weakness HEENT: No Headaches, Difficulty Swallowing,Tooth/Dental Problems,Sore Throat,  No Sneezing, Rhinitis, Ear Ache, Nasal Congestion, or Post Nasal Drip,  Cardio-vascular:  +Chest pain, Orthopnea, PND, Edema in lower extremities, Anasarca, Dizziness, Palpitations  Resp: +Dyspnea, +DOE, No Cough, No Hemoptysis, No Wheezing.    GI: No Heartburn, Indigestion, Abdominal Pain, Nausea, Vomiting, Diarrhea, Change in Bowel Habits,  Loss of Appetite  GU: No Dysuria, Change in Color of Urine, No Urgency or Frequency.  No flank pain.  Musculoskeletal: No Joint Pain or Swelling.  No Decreased Range of Motion. No Back Pain.  Neurologic: No Syncope, No Seizures, Muscle Weakness, Paresthesia, Vision Disturbance or Loss, No Diplopia, No Vertigo, No Difficulty Walking,  Skin: No Rash or Lesions.   Psych: No Change in Mood or Affect. No Depression or Anxiety. No Memory loss. No Confusion or  Hallucinations   Past Medical History  Diagnosis Date  . Closed left ankle fracture     August 30 2012  . Obesity   . Tobacco use disorder       History reviewed. No pertinent past surgical history.     Prior to Admission medications   Not on File      Allergies  Allergen Reactions  . Sulfa Antibiotics Swelling    Lips swelling, rash, itching     Social History:  reports that she has been smoking Cigarettes.  She has been smoking about 0.00 packs per day. She does not have any smokeless tobacco history on file. She reports that she drinks alcohol. She reports that she does not use illicit drugs.     Family History  Problem Relation Age of Onset  . Hypertension Mother   . Diabetes Paternal Aunt   . Diabetes Paternal Uncle        Physical Exam:  GEN:  Pleasant Obese 38 y.o. African American female  examined  and in no acute distress; cooperative with exam Filed Vitals:   04/27/13 1630 04/27/13 1844 04/27/13 1845 04/27/13 1900  BP: 93/39 133/71 133/71 112/56  Pulse: 113 111    Resp: 32 24 23 28   Height:      Weight:      SpO2:  97%     Blood pressure 112/56, pulse 111, resp. rate 28, height 6' (1.829 m), weight 136.079 kg (300 lb), last menstrual period 04/03/2013, SpO2 97.00%. PSYCH: She is alert and oriented x4; does not appear anxious does not  appear depressed; affect is normal HEENT: Normocephalic and Atraumatic, Mucous membranes pink; PERRLA; EOM intact; Fundi:  Benign;  No scleral icterus, Nares: Patent, Oropharynx: Clear, Fair Dentition, Neck:  FROM, no cervical lymphadenopathy nor thyromegaly or carotid bruit; no JVD; Breasts:: Not examined CHEST WALL: No tenderness CHEST: Normal respiration, clear to auscultation bilaterally HEART:   Tachycardic but Regular rate and rhythm; no murmurs rubs or gallops BACK: No kyphosis or scoliosis; no CVA tenderness ABDOMEN: Positive Bowel Sounds, Obese, soft non-tender; no masses, no organomegaly. Rectal Exam: Not  done EXTREMITIES: No cyanosis, clubbing or edema; no ulcerations. Genitalia: not examined PULSES: 2+ and symmetric SKIN: Normal hydration no rash or ulceration CNS:  Alert and Oriented X 4,  No Focal Deficits Vascular: pulses palpable throughout    Labs on Admission:  Basic Metabolic Panel:  Recent Labs Lab 04/27/13 1623  NA 140  K 3.6*  CL 102  CO2 17*  GLUCOSE 200*  BUN 9  CREATININE 1.17*  CALCIUM 8.9   Liver Function Tests:  Recent Labs Lab 04/27/13 1623  AST 69*  ALT 50*  ALKPHOS 74  BILITOT 0.2*  PROT 8.3  ALBUMIN 3.3*   No results found for this basename: LIPASE, AMYLASE,  in the last 168 hours No results found for this basename: AMMONIA,  in the last 168 hours CBC:  Recent Labs Lab 04/27/13 1623  WBC 10.6*  NEUTROABS 7.2  HGB 11.0*  HCT 35.1*  MCV 82.2  PLT 187   Cardiac Enzymes:  Recent Labs Lab 04/27/13 1623  TROPONINI <0.30    BNP (last 3 results)  Recent Labs  04/27/13 1623  PROBNP 48.7   CBG: No results found for this basename: GLUCAP,  in the last 168 hours  Radiological Exams on Admission: Ct Angio Chest Pe W/cm &/or Wo Cm  04/27/2013   CLINICAL DATA:  Shortness of breath  EXAM: CT ANGIOGRAPHY CHEST WITH CONTRAST  TECHNIQUE: Multidetector CT imaging of the chest was performed using the standard protocol during bolus administration of intravenous contrast. Multiplanar CT image reconstructions and MIPs were obtained to evaluate the vascular anatomy.  CONTRAST:  121mL OMNIPAQUE IOHEXOL 350 MG/ML SOLN  COMPARISON:  None.  FINDINGS: A large saddle embolus is appreciated. Extending from the distal main pulmonary artery and to the left and right pulmonary arteries with extension into the left upper lobe, left lower lobe and lingula branches. There is right upper lobe, right middle lobe and right lower lobe extension. Areas of subsegmental extension appreciated.  RV LV ratio 1.65. There are areas of intraventricular septal bowing to the  right.  The thoracic inlet is unremarkable.  There is no evidence of mediastinal masses or adenopathy.  The lungs are clear.  Visualized upper abdominal viscera are unremarkable.  Review of the MIP images confirms the above findings.  IMPRESSION: Positive for acute PE with CTevidence of right heart strain (RV/LV Ratio = 1.65 ) consistent with at least submassive (intermediate risk) PE. The presence of right heart strain has been associated with an increased risk of morbidity and mortality. Critical Value/emergent results were called by telephone at the time of interpretation on 04/27/2013 at 6:19 PM to Dr. Milton Ferguson , who verbally acknowledged these results.   Electronically Signed   By: Margaree Mackintosh M.D.   On: 04/27/2013 18:20   Dg Chest Port 1 View  04/27/2013   CLINICAL DATA:  sob  EXAM: PORTABLE CHEST - 1 VIEW  COMPARISON:  None.  FINDINGS: Low lung volumes. The heart  size and mediastinal contours are within normal limits. Both lungs are clear. The visualized skeletal structures are unremarkable.  IMPRESSION: No active disease.   Electronically Signed   By: Margaree Mackintosh M.D.   On: 04/27/2013 16:52      EKG: Independently reviewed.   Sinus Tachycardia at rate 121    Assessment/Plan:   38 y.o. female with  Principal Problem:   PE (pulmonary embolism) Active Problems:   Pleuritic chest pain   SOB (shortness of breath)   Hyperglycemia   Anemia   Tobacco use disorder    1.   Pulmonary Embolism - IV Heparin Drip and Initiate Coumadin , coumadin Teaching.   Monitor on Telemetry , and Monitor O2 Sats, O2 PRN.     2.   Pleuritc Chest Pain- due to #1.   3.   SOB- Due to #1, O2 PRN.    4.   Hyperglycemia-  New,  Check HbA1c, and monitor Glucose levels q ac and qhs with SSI coverage PRN.    5.   Anemia-  Normocytic Indices, Mild,  Monitor Tren especially since started on IV Heparin.  Send Anemia panel.     6.   Tobacco Use Disorder- Counseled RE: Tobacco Cessation, states she will quit  now.   Nicotine Patch 7 mg daily.        Code Status:   FULL CODE Family Communication:    Family at bedside Disposition Plan:    Inpatient   Time spent:  [facility] Pager [phone]  If 7PM-7AM, please contact night-coverage [email or web address] Password [facility] 04/27/2013, 8:19 PM

## 2013-04-27 NOTE — ED Notes (Signed)
Report given to Robbie, RN

## 2013-04-27 NOTE — Progress Notes (Addendum)
ANTICOAGULATION CONSULT NOTE - Initial Consult  Pharmacy Consult for Heparin >> Coumadin Indication: pulmonary embolus  Allergies  Allergen Reactions  . Sulfa Antibiotics Swelling    Lips swelling, rash, itching   Patient Measurements: Height: 6' (182.9 cm) Weight: 300 lb (136.079 kg) IBW/kg (Calculated) : 73.1 Heparin Dosing Weight: 98Kg  Vital Signs: BP: 133/71 mmHg (04/07 1844) Pulse Rate: 111 (04/07 1844)  Labs:  Recent Labs  04/27/13 1623  HGB 11.0*  HCT 35.1*  PLT 187  CREATININE 1.17*  TROPONINI <0.30   Estimated Creatinine Clearance: 103.2 ml/min (by C-G formula based on Cr of 1.17).  Medical History: History reviewed. No pertinent past medical history.  Medications:  Infusions:  . heparin    . heparin     Assessment: 38yo morbidly obese female.  Asked to initiate Heparin and Coumadin for pulmonary embolism.    Goal of Therapy:  Heparin level 0.3-0.7 units/ml Monitor platelets by anticoagulation protocol: Yes   Plan:  Heparin 4000 units IV now x 1 dose Heparin infusion at 2500 units/hr Heparin level in 6-8 hours then daily Coumadin 10mg  po today x 1 INR daily CBC daily  Hart Robinsons A 04/27/2013,6:48 PM

## 2013-04-27 NOTE — ED Notes (Signed)
Pt brought in by EMS. AT home in the floor. Pt had been drinking, beer bottle beside her. Pt c/o SOB at present. No hx of asthma. Pt states she has had 2 40 oz beers today. Denies drug use.

## 2013-04-27 NOTE — ED Provider Notes (Signed)
CSN: [id number]     Arrival date & time 04/27/13  1553 History  This chart was scribed for Maudry Diego, MD by Maree Erie, ED Scribe. The patient was seen in room APA09/APA09. Patient's care was started at 4:07 PM.      Chief Complaint  Patient presents with  . Shortness of Breath    Patient is a 38 y.o. female presenting with chest pain. The history is provided by the patient. No language interpreter was used.  Chest Pain Pain radiates to:  R shoulder Pain severity:  Moderate Onset quality:  Sudden Duration:  3 hours Timing:  Constant Chronicity:  New Associated symptoms: no back pain and no fatigue     HPI Comments: BRYLYNN HANSSEN is a 38 y.o. female brought in by ambulance, who presents to the Emergency Department complaining of constant chest pain that began about three hours ago. She states that she had sudden onset diaphoresis and her chest began hurting. She reports associated right shoulder pain that is worsened with touch. She believes that she had an episode of bowel incontinence prior to being picked up by EMS. She denies fever or chills.    History reviewed. No pertinent past medical history. History reviewed. No pertinent past surgical history. History reviewed. No pertinent family history. History  Substance Use Topics  . Smoking status: Current Every Day Smoker    Types: Cigarettes  . Smokeless tobacco: Not on file  . Alcohol Use: Yes   OB History   Grav Para Term Preterm Abortions TAB SAB Ect Mult Living                 Review of Systems  Constitutional: Negative for chills, appetite change and fatigue.  HENT: Negative for congestion, ear discharge and sinus pressure.   Eyes: Negative for discharge.  Cardiovascular: Positive for chest pain.  Gastrointestinal: Negative for diarrhea.  Genitourinary: Negative for frequency and hematuria.  Musculoskeletal: Positive for myalgias. Negative for back pain.  Neurological: Negative for seizures.   Hematological: Negative for adenopathy.  Psychiatric/Behavioral: Negative for hallucinations.      Allergies  Sulfa antibiotics  Home Medications   Current Outpatient Rx  Name  Route  Sig  Dispense  Refill  . acetaminophen (TYLENOL) 325 MG tablet   Oral   Take 650 mg by mouth every 6 (six) hours as needed for pain.         Marland Kitchen oxyCODONE-acetaminophen (PERCOCET/ROXICET) 5-325 MG per tablet   Oral   Take 2 tablets by mouth every 4 (four) hours as needed for pain.   20 tablet   0    Triage Vitals: BP 85/43  Pulse 123  Resp 35  Ht 6' (1.829 m)  Wt 300 lb (136.079 kg)  BMI 40.68 kg/m2  SpO2 94%  LMP 04/03/2013  Physical Exam  Nursing note and vitals reviewed. Constitutional: She is oriented to person, place, and time. She appears well-developed.  Anxious.  HENT:  Head: Normocephalic.  Eyes: Conjunctivae and EOM are normal. No scleral icterus.  Neck: Neck supple. No thyromegaly present.  Cardiovascular: Regular rhythm.  Tachycardia present.  Exam reveals no gallop and no friction rub.   No murmur heard. Pulmonary/Chest: No stridor. She has no wheezes. She has no rales. She exhibits no tenderness.  Abdominal: She exhibits no distension. There is no tenderness. There is no rebound.  Musculoskeletal: Normal range of motion. She exhibits no edema.  Tenderness in right shoulder.   Lymphadenopathy:    She has  no cervical adenopathy.  Neurological: She is oriented to person, place, and time. She exhibits normal muscle tone. Coordination normal.  Skin: No erythema.  Skin is clammy  Psychiatric: She has a normal mood and affect. Her behavior is normal.    ED Course  Procedures (including critical care time)  DIAGNOSTIC STUDIES: Oxygen Saturation is 94% on room air, adequate by my interpretation.    COORDINATION OF CARE: 4:12 PM -Will order IV fluids, CBC, CMP, UA, Urine Pregnancy, D-dimer, Lactic Acid, EKG, chest x-ray and Troponin I. Patient verbalizes understanding  and agrees with treatment plan.    Labs Review Labs Reviewed  CBC WITH DIFFERENTIAL - Abnormal; Notable for the following:    WBC 10.6 (*)    Hemoglobin 11.0 (*)    HCT 35.1 (*)    MCH 25.8 (*)    RDW 18.2 (*)    All other components within normal limits  COMPREHENSIVE METABOLIC PANEL - Abnormal; Notable for the following:    Potassium 3.6 (*)    CO2 17 (*)    Glucose, Bld 200 (*)    Creatinine, Ser 1.17 (*)    Albumin 3.3 (*)    AST 69 (*)    ALT 50 (*)    Total Bilirubin 0.2 (*)    GFR calc non Af Amer 59 (*)    GFR calc Af Amer 69 (*)    All other components within normal limits  URINALYSIS, ROUTINE W REFLEX MICROSCOPIC - Abnormal; Notable for the following:    Specific Gravity, Urine <1.005 (*)    All other components within normal limits  D-DIMER, QUANTITATIVE - Abnormal; Notable for the following:    D-Dimer, Quant 9.04 (*)    All other components within normal limits  ETHANOL - Abnormal; Notable for the following:    Alcohol, Ethyl (B) 94 (*)    All other components within normal limits  TROPONIN I  PREGNANCY, URINE  PRO B NATRIURETIC PEPTIDE  URINE RAPID DRUG SCREEN (HOSP PERFORMED)  LACTIC ACID, PLASMA   Imaging Review Ct Angio Chest Pe W/cm &/or Wo Cm  04/27/2013   CLINICAL DATA:  Shortness of breath  EXAM: CT ANGIOGRAPHY CHEST WITH CONTRAST  TECHNIQUE: Multidetector CT imaging of the chest was performed using the standard protocol during bolus administration of intravenous contrast. Multiplanar CT image reconstructions and MIPs were obtained to evaluate the vascular anatomy.  CONTRAST:  159mL OMNIPAQUE IOHEXOL 350 MG/ML SOLN  COMPARISON:  None.  FINDINGS: A large saddle embolus is appreciated. Extending from the distal main pulmonary artery and to the left and right pulmonary arteries with extension into the left upper lobe, left lower lobe and lingula branches. There is right upper lobe, right middle lobe and right lower lobe extension. Areas of subsegmental  extension appreciated.  RV LV ratio 1.65. There are areas of intraventricular septal bowing to the right.  The thoracic inlet is unremarkable.  There is no evidence of mediastinal masses or adenopathy.  The lungs are clear.  Visualized upper abdominal viscera are unremarkable.  Review of the MIP images confirms the above findings.  IMPRESSION: Positive for acute PE with CTevidence of right heart strain (RV/LV Ratio = 1.65 ) consistent with at least submassive (intermediate risk) PE. The presence of right heart strain has been associated with an increased risk of morbidity and mortality. Critical Value/emergent results were called by telephone at the time of interpretation on 04/27/2013 at 6:19 PM to Dr. Milton Ferguson , who verbally acknowledged these results.  Electronically Signed   By: Margaree Mackintosh M.D.   On: 04/27/2013 18:20   Dg Chest Port 1 View  04/27/2013   CLINICAL DATA:  sob  EXAM: PORTABLE CHEST - 1 VIEW  COMPARISON:  None.  FINDINGS: Low lung volumes. The heart size and mediastinal contours are within normal limits. Both lungs are clear. The visualized skeletal structures are unremarkable.  IMPRESSION: No active disease.   Electronically Signed   By: Margaree Mackintosh M.D.   On: 04/27/2013 16:52     EKG Interpretation None     CRITICAL CARE Performed by: Yancy Hascall L Total critical care time: 45 Critical care time was exclusive of separately billable procedures and treating other patients. Critical care was necessary to treat or prevent imminent or life-threatening deterioration. Critical care was time spent personally by me on the following activities: development of treatment plan with patient and/or surrogate as well as nursing, discussions with consultants, evaluation of patient's response to treatment, examination of patient, obtaining history from patient or surrogate, ordering and performing treatments and interventions, ordering and review of laboratory studies, ordering and review  of radiographic studies, pulse oximetry and re-evaluation of patient's condition.  MDM   Final diagnoses:  None   The chart was scribed for me under my direct supervision.  I personally performed the history, physical, and medical decision making and all procedures in the evaluation of this patient.Maudry Diego, MD 04/27/13 [phone]

## 2013-04-28 ENCOUNTER — Inpatient Hospital Stay (HOSPITAL_COMMUNITY): Payer: Self-pay

## 2013-04-28 DIAGNOSIS — I517 Cardiomegaly: Secondary | ICD-10-CM

## 2013-04-28 LAB — GLUCOSE, CAPILLARY
GLUCOSE-CAPILLARY: 105 mg/dL — AB (ref 70–99)
GLUCOSE-CAPILLARY: 133 mg/dL — AB (ref 70–99)
Glucose-Capillary: 74 mg/dL (ref 70–99)
Glucose-Capillary: 78 mg/dL (ref 70–99)

## 2013-04-28 LAB — BASIC METABOLIC PANEL
BUN: 9 mg/dL (ref 6–23)
CO2: 22 mEq/L (ref 19–32)
CREATININE: 0.77 mg/dL (ref 0.50–1.10)
Calcium: 8.4 mg/dL (ref 8.4–10.5)
Chloride: 106 mEq/L (ref 96–112)
GFR calc Af Amer: >90 mL/min (ref >90)
GFR calc non Af Amer: >90 mL/min (ref >90)
Glucose, Bld: 98 mg/dL (ref 70–99)
Potassium: 4.6 mEq/L (ref 3.7–5.3)
Sodium: 139 mEq/L (ref 137–147)

## 2013-04-28 LAB — PROTIME-INR
INR: 1.17 (ref 0.00–1.49)
Prothrombin Time: 14.7 seconds (ref 11.6–15.2)

## 2013-04-28 LAB — HEPARIN LEVEL (UNFRACTIONATED)
HEPARIN UNFRACTIONATED: 1.03 [IU]/mL — AB (ref 0.30–0.70)
HEPARIN UNFRACTIONATED: 0.7 [IU]/mL (ref 0.30–0.70)

## 2013-04-28 LAB — CBC
HCT: 32.1 % — ABNORMAL LOW (ref 36.0–46.0)
Hemoglobin: 10.2 g/dL — ABNORMAL LOW (ref 12.0–15.0)
MCH: 25.9 pg — ABNORMAL LOW (ref 26.0–34.0)
MCHC: 31.8 g/dL (ref 30.0–36.0)
MCV: 81.5 fL (ref 78.0–100.0)
Platelets: 157 10*3/uL (ref 150–400)
RBC: 3.94 MIL/uL (ref 3.87–5.11)
RDW: 18.4 % — AB (ref 11.5–15.5)
WBC: 9.5 10*3/uL (ref 4.0–10.5)

## 2013-04-28 LAB — HOMOCYSTEINE: Homocysteine: 5.1 umol/L (ref 4.0–15.4)

## 2013-04-28 LAB — HEMOGLOBIN A1C
HEMOGLOBIN A1C: 5 % (ref <5.7)
Mean Plasma Glucose: 97 mg/dL (ref <117)

## 2013-04-28 MED ORDER — WARFARIN VIDEO: Freq: Once | Status: DC

## 2013-04-28 MED ORDER — HEPARIN (PORCINE) IN NACL 100-0.45 UNIT/ML-% IJ SOLN
2000.0000 [IU]/h | INTRAMUSCULAR | Status: AC
  Administered 2013-04-28: 2500 [IU]/h via INTRAVENOUS
  Administered 2013-04-28 – 2013-04-29 (×2): 2000 [IU]/h via INTRAVENOUS
  Filled 2013-04-28 (×3): qty 250

## 2013-04-28 MED ORDER — PATIENT'S GUIDE TO USING COUMADIN BOOK
Freq: Once | Status: DC
  Filled 2013-04-28: qty 1

## 2013-04-28 NOTE — Care Management Note (Addendum)
    Page 1 of 2   04/30/2013     11:59:56 AM   CARE MANAGEMENT NOTE 04/30/2013  Patient:  [name] [name], [name] [name]   Account Number:  [id number]  Date Initiated:  04/28/2013  Documentation initiated by:  Theophilus Kinds  Subjective/Objective Assessment:   Pt admitted from home with PE. Pt lives with her boyfriend and will return home at discharge. Pt is independent with ADL's.     Action/Plan:   Pt PCP established with [facility] and appt documented on AVS. Pt aware. Pt may d/c on Xarelto and card will be given along with financial assitance paperwork for free medication.Financial counselor aware.   Anticipated DC Date:  05/01/2013   Anticipated DC Plan:  HOME/SELF CARE  In-house referral  Financial Counselor      DC Planning Services  CM consult      [facility] Choice  DURABLE MEDICAL EQUIPMENT   Choice offered to / List presented to:  C-1 Patient   DME arranged  [facility]      DME agency  [place].        Status of service:  Completed, signed off Medicare Important Message given?   (If response is "NO", the following Medicare IM given date fields will be blank) Date Medicare IM given:   Date Additional Medicare IM given:    Discharge Disposition:  HOME/SELF CARE  Per UR Regulation:    If discussed at Long Length of Stay Meetings, dates discussed:    Comments:  04/30/13 [place], RN BSN CM Pts home O2 arranged with [facility]. Pt has no insurance.  04/28/13 [place], RN BSN CM

## 2013-04-28 NOTE — Progress Notes (Signed)
ANTICOAGULATION CONSULT NOTE - follow up  Pharmacy Consult for Heparin >> Coumadin Indication: pulmonary embolus  Allergies  Allergen Reactions  . Sulfa Antibiotics Swelling    Lips swelling, rash, itching   Patient Measurements: Height: 6' (182.9 cm) Weight: 300 lb (136.079 kg) IBW/kg (Calculated) : 73.1 Heparin Dosing Weight: 98Kg  Vital Signs: Temp: 98.4 F (36.9 C) (04/08 0400) Temp src: Oral (04/08 0400) BP: 120/79 mmHg (04/08 0400) Pulse Rate: 89 (04/08 0400)  Labs:  Recent Labs  04/27/13 1623 04/28/13 0417  HGB 11.0* 10.2*  HCT 35.1* 32.1*  PLT 187 157  LABPROT  --  14.7  INR  --  1.17  HEPARINUNFRC  --  1.03*  CREATININE 1.17* 0.77  TROPONINI <0.30  --    Estimated Creatinine Clearance: 150.9 ml/min (by C-G formula based on Cr of 0.77).  Medical History: Past Medical History  Diagnosis Date  . Closed left ankle fracture     August 30 2012  . Obesity   . Tobacco use disorder    Medications:  Infusions:  . heparin     Assessment: 37yo morbidly obese female.  Asked to initiate Heparin and Coumadin for pulmonary embolism.  Heparin level is above target.  Day #2 Heparin / Coumadin overlap.  Pt received Coumadin 10mg  PO last night.  Goal of Therapy:  Heparin level 0.3-0.7 units/ml Monitor platelets by anticoagulation protocol: Yes   Plan:  Taper Heparin infusion to 2000 units/hr Heparin level in 6-8 hours then daily Minimum of 5 days overlap of Heparin / Coumadin with INR > 2 Coumadin education (book and video ordered) INR daily CBC daily  Glennie Bose A Trista Ciocca 04/28/2013,7:51 AM

## 2013-04-28 NOTE — Progress Notes (Signed)
Patient ID: [name] [name], female   DOB: [birth year], 38 y.o.   MRN: [id number]   Pt scheduled at [facility] radiology 4/9 for retrievable IVC filter placement  See orders in Epic To be at [facility] via ambulance by 9am 4/9 Will return to [facility] after procedure

## 2013-04-28 NOTE — Consult Note (Signed)
Consult requested by: Dr. Sheran Fava Consult requested for pulmonary embolus:  HPI: This is a 38 year old who was in her usual state of good health at home when she got up from a seated position and collapsed. According to her family member who is with her she became stiff and was snoring. She was unconscious and when she woke up she was complaining of shortness of breath and chest pain. She had similar episodes were not as severe within the last several months. She has not had any travel or prolonged bed rest but she did break her ankle last year. However he has beens up and moving. She is unaware of any clotting abnormalities in the family. She does not use oral contraceptive agents but she does smoke.   Past Medical History  Diagnosis Date  . Closed left ankle fracture     August 30 2012  . Obesity   . Tobacco use disorder      Family History  Problem Relation Age of Onset  . Hypertension Mother   . Diabetes Paternal Aunt   . Diabetes Paternal Uncle      History   Social History  . Marital Status: Legally Separated    Spouse Name: N/A    Number of Children: N/A  . Years of Education: N/A   Social History Main Topics  . Smoking status: Current Every Day Smoker    Types: Cigarettes  . Smokeless tobacco: None  . Alcohol Use: Yes  . Drug Use: No  . Sexual Activity: Yes    Birth Control/ Protection: None   Other Topics Concern  . None   Social History Narrative  . None     ROS:  she says she gets some swelling in her left ankle she was told that she would have that after her ankle fracture. No other chest pain etc. except as mentioned. She's not had previous syncope.     Objective: Vital signs in last 24 hours: Temp:  [97.9 F (36.6 C)-98.4 F (36.9 C)] 97.9 F (36.6 C) (04/08 1200) Pulse Rate:  [86-117] 103 (04/08 1600) Resp:  [15-29] 23 (04/08 1400) BP: (112-155)/(56-98) 117/74 mmHg (04/08 1600) SpO2:  [92 %-100 %] 100 % (04/08 1600) Weight change:  Last BM  Date: 04/27/13  Intake/Output from previous day: 04/07 0701 - 04/08 0700 In: 227.5 [I.V.:227.5] Out: -   PHYSICAL EXAM She is awake and alert. She is obese. Her pupils are reactive nose and throat are clear. She looks slightly dyspneic at rest. HEENT exam is unremarkable. Her neck is supple. Her chest shows decreased breath sounds but I do not hear any rubs. Her heart is regular. I don't hear an S3 gallop. Her abdomen is soft without masses. Extremities do not show any definite cords but do show some swelling. Central nervous system exam is grossly intact  Lab Results: Basic Metabolic Panel:  Recent Labs  04/27/13 1623 04/28/13 0417  NA 140 139  K 3.6* 4.6  CL 102 106  CO2 17* 22  GLUCOSE 200* 98  BUN 9 9  CREATININE 1.17* 0.77  CALCIUM 8.9 8.4   Liver Function Tests:  Recent Labs  04/27/13 1623  AST 69*  ALT 50*  ALKPHOS 74  BILITOT 0.2*  PROT 8.3  ALBUMIN 3.3*   No results found for this basename: LIPASE, AMYLASE,  in the last 72 hours No results found for this basename: AMMONIA,  in the last 72 hours CBC:  Recent Labs  04/27/13 1623 04/28/13 0417  WBC 10.6* 9.5  NEUTROABS 7.2  --   HGB 11.0* 10.2*  HCT 35.1* 32.1*  MCV 82.2 81.5  PLT 187 157   Cardiac Enzymes:  Recent Labs  04/27/13 1623  TROPONINI <0.30   BNP:  Recent Labs  04/27/13 1623  PROBNP 48.7   D-Dimer:  Recent Labs  04/27/13 1623  DDIMER 9.04*   CBG:  Recent Labs  04/27/13 2210 04/28/13 0811 04/28/13 1131 04/28/13 1624  GLUCAP 88 74 78 105*   Hemoglobin A1C:  Recent Labs  04/28/13 0417  HGBA1C 5.0   Fasting Lipid Panel: No results found for this basename: CHOL, HDL, LDLCALC, TRIG, CHOLHDL, LDLDIRECT,  in the last 72 hours Thyroid Function Tests: No results found for this basename: TSH, T4TOTAL, FREET4, T3FREE, THYROIDAB,  in the last 72 hours Anemia Panel: No results found for this basename: VITAMINB12, FOLATE, FERRITIN, TIBC, IRON, RETICCTPCT,  in the last  72 hours Coagulation:  Recent Labs  04/28/13 0417  LABPROT 14.7  INR 1.17   Urine Drug Screen: Drugs of Abuse     Component Value Date/Time   LABOPIA NONE DETECTED 04/27/2013 1639   COCAINSCRNUR NONE DETECTED 04/27/2013 1639   LABBENZ NONE DETECTED 04/27/2013 1639   AMPHETMU NONE DETECTED 04/27/2013 1639   THCU NONE DETECTED 04/27/2013 1639   LABBARB NONE DETECTED 04/27/2013 1639    Alcohol Level:  Recent Labs  04/27/13 1623  ETH 94*   Urinalysis:  Recent Labs  04/27/13 1639  COLORURINE YELLOW  LABSPEC <1.005*  PHURINE 5.5  GLUCOSEU NEGATIVE  HGBUR NEGATIVE  BILIRUBINUR NEGATIVE  KETONESUR NEGATIVE  PROTEINUR NEGATIVE  UROBILINOGEN 0.2  NITRITE NEGATIVE  LEUKOCYTESUR NEGATIVE   Misc. Labs:   ABGS: No results found for this basename: PHART, PCO2, PO2ART, TCO2, HCO3,  in the last 72 hours   MICROBIOLOGY: Recent Results (from the past 240 hour(s))  MRSA PCR SCREENING     Status: None   Collection Time    04/27/13  9:54 PM      Result Value Ref Range Status   MRSA by PCR NEGATIVE  NEGATIVE Final   Comment:            The GeneXpert MRSA Assay (FDA     approved for NASAL specimens     only), is one component of a     comprehensive MRSA colonization     surveillance program. It is not     intended to diagnose MRSA     infection nor to guide or     monitor treatment for     MRSA infections.    Studies/Results: Ct Angio Chest Pe W/cm &/or Wo Cm  04/27/2013   CLINICAL DATA:  Shortness of breath  EXAM: CT ANGIOGRAPHY CHEST WITH CONTRAST  TECHNIQUE: Multidetector CT imaging of the chest was performed using the standard protocol during bolus administration of intravenous contrast. Multiplanar CT image reconstructions and MIPs were obtained to evaluate the vascular anatomy.  CONTRAST:  172mL OMNIPAQUE IOHEXOL 350 MG/ML SOLN  COMPARISON:  None.  FINDINGS: A large saddle embolus is appreciated. Extending from the distal main pulmonary artery and to the left and right  pulmonary arteries with extension into the left upper lobe, left lower lobe and lingula branches. There is right upper lobe, right middle lobe and right lower lobe extension. Areas of subsegmental extension appreciated.  RV LV ratio 1.65. There are areas of intraventricular septal bowing to the right.  The thoracic inlet is unremarkable.  There is no evidence of  mediastinal masses or adenopathy.  The lungs are clear.  Visualized upper abdominal viscera are unremarkable.  Review of the MIP images confirms the above findings.  IMPRESSION: Positive for acute PE with CTevidence of right heart strain (RV/LV Ratio = 1.65 ) consistent with at least submassive (intermediate risk) PE. The presence of right heart strain has been associated with an increased risk of morbidity and mortality. Critical Value/emergent results were called by telephone at the time of interpretation on 04/27/2013 at 6:19 PM to Dr. Milton Ferguson , who verbally acknowledged these results.   Electronically Signed   By: Margaree Mackintosh M.D.   On: 04/27/2013 18:20   US Venous Img Lower Bilateral  04/28/2013   CLINICAL DATA:  Sub massive pulmonary emboli on recent chest CTA.  EXAM: BILATERAL LOWER EXTREMITY VENOUS DOPPLER ULTRASOUND  TECHNIQUE: Gray-scale sonography with compression, as well as color and duplex ultrasound, were performed to evaluate the deep venous system from the level of the common femoral vein through the popliteal and proximal calf veins.  COMPARISON:  None  FINDINGS: On the right, Normal compressibility of the common femoral, superficial femoral, and popliteal veins, as well as the proximal calf veins. No filling defects to suggest DVT on grayscale or color Doppler imaging. Doppler waveforms show normal direction of venous flow, normal respiratory phasicity and response to augmentation.  On the left, normal compressibility, phasicity, and augmentation in the common femoral, profunda femoral, and superficial femoral veins. Popliteal  vein is incompletely compressible. At least partial flows noted on color Doppler interrogation through the popliteal vein. Visualized calf veins unremarkable. Visualized segments of greater saphenous vein normal in caliber and compressibility.  IMPRESSION: 1. Incompletely occlusive left popliteal DVT. 2. No evidence of right lower extremity DVT.   Electronically Signed   By: Arne Cleveland M.D.   On: 04/28/2013 14:48   Dg Chest Port 1 View  04/27/2013   CLINICAL DATA:  sob  EXAM: PORTABLE CHEST - 1 VIEW  COMPARISON:  None.  FINDINGS: Low lung volumes. The heart size and mediastinal contours are within normal limits. Both lungs are clear. The visualized skeletal structures are unremarkable.  IMPRESSION: No active disease.   Electronically Signed   By: Margaree Mackintosh M.D.   On: 04/27/2013 16:52    Medications:  Prior to Admission:  No prescriptions prior to admission   Scheduled: . insulin aspart  0-9 Units Subcutaneous TID WC  . sodium chloride  3 mL Intravenous Q12H  . sodium chloride  3 mL Intravenous Q12H   Continuous: . heparin 2,000 Units/hr (04/28/13 1700)   ZOX:WRUEAV chloride, acetaminophen, acetaminophen, alum & mag hydroxide-simeth, ondansetron (ZOFRAN) IV, ondansetron, oxyCODONE, sodium chloride  Assesment: she was admitted with pulmonary embolus. She has at least moderate amount of clot and did show right heart strain by CT criteria. Echocardiogram shows right heart strain and right ventricular dilatation. There is equivocal clot in her leg Principal Problem:   PE (pulmonary embolism) Active Problems:   Pleuritic chest pain   SOB (shortness of breath)   Hyperglycemia   Anemia   Tobacco use disorder   Pulmonary embolism    Plan: I agree with IVC filter because I think if she has more clot it very well could be fatal. We discussed treatment and prognosis.   thanks for allowing me to see her with you    LOS: 1 day   Alonza Bogus 04/28/2013, 5:50 PM

## 2013-04-28 NOTE — Progress Notes (Signed)
  Echocardiogram 2D Echocardiogram has been performed.  Tymira Horkey Orlean Patten 04/28/2013, 12:52 PM

## 2013-04-28 NOTE — Progress Notes (Signed)
[facility] PROGRESS NOTE  [name] [name] PPI:[id number] DOB: 11-Dec-1975 DOA: 04/27/2013 PCP: No PCP Per Patient  Assessment/Plan  Acute hypoxic respiratory failure secondary to submassive PE.  Patient was hypotensive on presentation with a large saddle embolism appreciated on CT with evidence of right heart strain, however BP improved quickly with IVF -  Discussed case with pulmonology who stated that since the patient is clinically better in terms of blood pressure with IV fluids, defer her escalation of care for now. If she were to become repeatedly hypotensive or hypoxic, then can consider lysis at that time. -  Hypercoagulable workup excluding protein C&S and antithrombin III which are affected by anticoagulation and recent blood clot -  Would defer initiation of Coumadin if that is our choice of anticoagulation for at least 24 hours after initiation of heparin -  Would consider Xarelto instead as this medication can be available for free for patients without insurance and is not require as many followup appointments for her blood work.   -  Case management to find out about Xarelto card and to establish a primary care doctor for this patient -  Echocardiogram to evaluate for right heart strain -  Strongly advised to quit smoking  Hyperglycemia was likely secondary to stress, and has since resolved -  F/u A1c -  Continue SSI pending A1c  Anemia may be due to consumption from pulmonary embolism -  Trend CBC  Cigarettes nicotine abuse and dependence with withdrawal -  Counseled cessation -  Continue nicotine patch  Diet:  Diabetic  Access:  PIV IVF:  off Proph:  Heparin gtt  Code Status: full Family Communication: patient and her long-time boyfriend Disposition Plan: Pending a full 24 hours of heparin, completion of lower extremity duplex, echocardiogram, and establishment of primary care doctor and anticoagulation plan   Consultants: Pulmonology by  phone  Procedures:  CT angio chest  ECHO  Antibiotics:  None   HPI/Subjective:  Persistent shortness of breath.  Denies hemoptysis  Objective: Filed Vitals:   04/27/13 2200 04/27/13 2300 04/28/13 0000 04/28/13 0400  BP: 155/97  129/60 120/79  Pulse:  107 106 89  Temp:    98.4 F (36.9 C)  TempSrc:    Oral  Resp: 15     Height:      Weight:      SpO2:  100% 100% 100%    Intake/Output Summary (Last 24 hours) at 04/28/13 0750 Last data filed at 04/28/13 0400  Gross per 24 hour  Intake  227.5 ml  Output      0 ml  Net  227.5 ml   Filed Weights   04/27/13 1547  Weight: 136.079 kg (300 lb)    Exam:   General:  Obese African American female, No acute distress  HEENT:  NCAT, MMM  Cardiovascular:  Mild tachycardia, RR, nl S1, S2 no mrg, 2+ pulses, warm extremities  Respiratory:  CTAB, no increased WOB  Abdomen:   NABS, soft, NT/ND  MSK:   Normal tone and bulk, left calf is slightly tense, and no warmth, erythema , or pitting edema  Neuro:  Grossly intact  Data Reviewed: Basic Metabolic Panel:  Recent Labs Lab 04/27/13 1623 04/28/13 0417  NA 140 139  K 3.6* 4.6  CL 102 106  CO2 17* 22  GLUCOSE 200* 98  BUN 9 9  CREATININE 1.17* 0.77  CALCIUM 8.9 8.4   Liver Function Tests:  Recent Labs Lab 04/27/13 1623  AST 69*  ALT  50*  ALKPHOS 74  BILITOT 0.2*  PROT 8.3  ALBUMIN 3.3*   No results found for this basename: LIPASE, AMYLASE,  in the last 168 hours No results found for this basename: AMMONIA,  in the last 168 hours CBC:  Recent Labs Lab 04/27/13 1623 04/28/13 0417  WBC 10.6* 9.5  NEUTROABS 7.2  --   HGB 11.0* 10.2*  HCT 35.1* 32.1*  MCV 82.2 81.5  PLT 187 157   Cardiac Enzymes:  Recent Labs Lab 04/27/13 1623  TROPONINI <0.30   BNP (last 3 results)  Recent Labs  04/27/13 1623  PROBNP 48.7   CBG:  Recent Labs Lab 04/27/13 2210  GLUCAP 88    Recent Results (from the past 240 hour(s))  MRSA PCR SCREENING      Status: None   Collection Time    04/27/13  9:54 PM      Result Value Ref Range Status   MRSA by PCR NEGATIVE  NEGATIVE Final   Comment:            The GeneXpert MRSA Assay (FDA     approved for NASAL specimens     only), is one component of a     comprehensive MRSA colonization     surveillance program. It is not     intended to diagnose MRSA     infection nor to guide or     monitor treatment for     MRSA infections.     Studies: Ct Angio Chest Pe W/cm &/or Wo Cm  04/27/2013   CLINICAL DATA:  Shortness of breath  EXAM: CT ANGIOGRAPHY CHEST WITH CONTRAST  TECHNIQUE: Multidetector CT imaging of the chest was performed using the standard protocol during bolus administration of intravenous contrast. Multiplanar CT image reconstructions and MIPs were obtained to evaluate the vascular anatomy.  CONTRAST:  137mL OMNIPAQUE IOHEXOL 350 MG/ML SOLN  COMPARISON:  None.  FINDINGS: A large saddle embolus is appreciated. Extending from the distal main pulmonary artery and to the left and right pulmonary arteries with extension into the left upper lobe, left lower lobe and lingula branches. There is right upper lobe, right middle lobe and right lower lobe extension. Areas of subsegmental extension appreciated.  RV LV ratio 1.65. There are areas of intraventricular septal bowing to the right.  The thoracic inlet is unremarkable.  There is no evidence of mediastinal masses or adenopathy.  The lungs are clear.  Visualized upper abdominal viscera are unremarkable.  Review of the MIP images confirms the above findings.  IMPRESSION: Positive for acute PE with CTevidence of right heart strain (RV/LV Ratio = 1.65 ) consistent with at least submassive (intermediate risk) PE. The presence of right heart strain has been associated with an increased risk of morbidity and mortality. Critical Value/emergent results were called by telephone at the time of interpretation on 04/27/2013 at 6:19 PM to Dr. Milton Ferguson , who  verbally acknowledged these results.   Electronically Signed   By: Margaree Mackintosh M.D.   On: 04/27/2013 18:20   Dg Chest Port 1 View  04/27/2013   CLINICAL DATA:  sob  EXAM: PORTABLE CHEST - 1 VIEW  COMPARISON:  None.  FINDINGS: Low lung volumes. The heart size and mediastinal contours are within normal limits. Both lungs are clear. The visualized skeletal structures are unremarkable.  IMPRESSION: No active disease.   Electronically Signed   By: Margaree Mackintosh M.D.   On: 04/27/2013 16:52    Scheduled Meds: . insulin aspart  0-5 Units Subcutaneous QHS  . insulin aspart  0-9 Units Subcutaneous TID WC  . patient's guide to using coumadin book   Does not apply Once  . sodium chloride  3 mL Intravenous Q12H  . sodium chloride  3 mL Intravenous Q12H  . warfarin   Does not apply Once   Continuous Infusions: . heparin      Principal Problem:   PE (pulmonary embolism) Active Problems:   Pleuritic chest pain   SOB (shortness of breath)   Hyperglycemia   Anemia   Tobacco use disorder   Pulmonary embolism    Time spent: 30 min    [facility] Pager [phone]. If 7PM-7AM, please contact night-coverage at [email or web address], password [facility] 04/28/2013, 7:50 AM  LOS: 1 day

## 2013-04-28 NOTE — Progress Notes (Signed)
Patient has left lower extremity popliteal DVT. Discussed case with Dr. Lamonte Sakai, pulmonology/critical care, and after deliberation, recommended for placement of retrievable IVC filter placement.  Discussed with radiology.  [facility].  Please keep heparin gtt running and it will be shut off prior to procedure at [facility]

## 2013-04-28 NOTE — Progress Notes (Signed)
ANTICOAGULATION CONSULT NOTE - follow up  Pharmacy Consult for Heparin  Indication: pulmonary embolus  Allergies  Allergen Reactions  . Sulfa Antibiotics Swelling    Lips swelling, rash, itching   Patient Measurements: Height: 6' (182.9 cm) Weight: 300 lb (136.079 kg) IBW/kg (Calculated) : 73.1 Heparin Dosing Weight: 98Kg  Vital Signs: Temp: 97.9 F (36.6 C) (04/08 1200) Temp src: Oral (04/08 1200) BP: 123/80 mmHg (04/08 1000) Pulse Rate: 98 (04/08 1000)  Labs:  Recent Labs  04/27/13 1623 04/28/13 0417 04/28/13 1512  HGB 11.0* 10.2*  --   HCT 35.1* 32.1*  --   PLT 187 157  --   LABPROT  --  14.7  --   INR  --  1.17  --   HEPARINUNFRC  --  1.03* 0.70  CREATININE 1.17* 0.77  --   TROPONINI <0.30  --   --    Estimated Creatinine Clearance: 150.9 ml/min (by C-G formula based on Cr of 0.77).  Medical History: Past Medical History  Diagnosis Date  . Closed left ankle fracture     August 30 2012  . Obesity   . Tobacco use disorder    Medications:  Infusions:  . heparin 2,000 Units/hr (04/28/13 1130)   Assessment: 37yo morbidly obese female.  Asked to initiate Heparin for pulmonary embolism.  Heparin level is now on target.   Coumadin has been d/c'd per MD.  Pt may be transitioned to Xarelto prior to discharge per reports because pt can get it free and will not need frequent f/u appointments as pt does not have insurance.  Plan is for pt to go to [facility] tomorrow for IVC filter placement per report.    Goal of Therapy:  Heparin level 0.3-0.7 units/ml Monitor platelets by anticoagulation protocol: Yes   Plan:  Continue Heparin infusion at 2000 units/hr Heparin level daily CBC daily  Arelys Glassco A Tryphena Perkovich 04/28/2013,4:34 PM

## 2013-04-28 NOTE — Progress Notes (Signed)
UR chart review completed.  

## 2013-04-29 ENCOUNTER — Ambulatory Visit (HOSPITAL_COMMUNITY)
Admit: 2013-04-29 | Discharge: 2013-04-29 | Disposition: A | Discharge status: Home / Self Care | Payer: MEDICAID | Attending: Internal Medicine | Admitting: Internal Medicine

## 2013-04-29 DIAGNOSIS — I2609 Other pulmonary embolism with acute cor pulmonale: Secondary | ICD-10-CM

## 2013-04-29 DIAGNOSIS — I2699 Other pulmonary embolism without acute cor pulmonale: Secondary | ICD-10-CM | POA: Insufficient documentation

## 2013-04-29 DIAGNOSIS — I2692 Saddle embolus of pulmonary artery without acute cor pulmonale: Principal | ICD-10-CM

## 2013-04-29 LAB — LUPUS ANTICOAGULANT PANEL
DRVVT: 29.4 s (ref <42.9)
Lupus Anticoagulant: NOT DETECTED
PTT Lupus Anticoagulant: 142.7 secs — ABNORMAL HIGH (ref 28.0–43.0)
PTTLA 41 MIX: 155.4 s — AB (ref 28.0–43.0)
PTTLA CONFIRMATION: 5 s (ref <8.0)

## 2013-04-29 LAB — FACTOR 5 LEIDEN

## 2013-04-29 LAB — BETA-2-GLYCOPROTEIN I ABS, IGG/M/A
BETA-2-GLYCOPROTEIN I IGM: 6 M Units (ref <20)
Beta-2 Glyco I IgG: 12 G Units (ref <20)
Beta-2-Glycoprotein I IgA: 8 A Units (ref <20)

## 2013-04-29 LAB — CBC
HCT: 30.2 % — ABNORMAL LOW (ref 36.0–46.0)
Hemoglobin: 9.8 g/dL — ABNORMAL LOW (ref 12.0–15.0)
MCH: 26.8 pg (ref 26.0–34.0)
MCHC: 32.5 g/dL (ref 30.0–36.0)
MCV: 82.5 fL (ref 78.0–100.0)
Platelets: 149 10*3/uL — ABNORMAL LOW (ref 150–400)
RBC: 3.66 MIL/uL — ABNORMAL LOW (ref 3.87–5.11)
RDW: 18.7 % — AB (ref 11.5–15.5)
WBC: 5.9 10*3/uL (ref 4.0–10.5)

## 2013-04-29 LAB — CARDIOLIPIN ANTIBODIES, IGG, IGM, IGA
ANTICARDIOLIPIN IGA: 6 U/mL — AB (ref <22)
ANTICARDIOLIPIN IGG: 12 GPL U/mL (ref <23)
ANTICARDIOLIPIN IGM: 1 [MPL'U]/mL — AB (ref <11)

## 2013-04-29 LAB — GLUCOSE, CAPILLARY: Glucose-Capillary: 103 mg/dL — ABNORMAL HIGH (ref 70–99)

## 2013-04-29 LAB — PROTHROMBIN GENE MUTATION

## 2013-04-29 LAB — HEPARIN LEVEL (UNFRACTIONATED): Heparin Unfractionated: 0.38 IU/mL (ref 0.30–0.70)

## 2013-04-29 MED ORDER — FENTANYL CITRATE 0.05 MG/ML IJ SOLN
INTRAMUSCULAR | Status: AC
  Filled 2013-04-29: qty 4

## 2013-04-29 MED ORDER — OXYCODONE HCL 5 MG PO TABS: 5.0000 mg | ORAL_TABLET | ORAL | Status: DC | PRN

## 2013-04-29 MED ORDER — RIVAROXABAN 20 MG PO TABS: 20.0000 mg | ORAL_TABLET | Freq: Every day | ORAL | Status: DC

## 2013-04-29 MED ORDER — RIVAROXABAN 15 MG PO TABS: 15.0000 mg | ORAL_TABLET | Freq: Two times a day (BID) | ORAL | Status: DC

## 2013-04-29 MED ORDER — IOHEXOL 300 MG/ML  SOLN
100.0000 mL | Freq: Once | INTRAMUSCULAR | Status: AC | PRN
  Administered 2013-04-29: 50 mL via INTRAVENOUS

## 2013-04-29 MED ORDER — MIDAZOLAM HCL 2 MG/2ML IJ SOLN
INTRAMUSCULAR | Status: AC
  Filled 2013-04-29: qty 4

## 2013-04-29 MED ORDER — FENTANYL CITRATE 0.05 MG/ML IJ SOLN
INTRAMUSCULAR | Status: DC | PRN
  Administered 2013-04-29: 100 ug via INTRAVENOUS

## 2013-04-29 MED ORDER — MIDAZOLAM HCL 2 MG/2ML IJ SOLN
INTRAMUSCULAR | Status: DC | PRN
  Administered 2013-04-29: 2 mg via INTRAVENOUS
  Administered 2013-04-29: 1 mg via INTRAVENOUS

## 2013-04-29 MED ORDER — RIVAROXABAN 15 MG PO TABS
15.0000 mg | ORAL_TABLET | Freq: Two times a day (BID) | ORAL | Status: DC
  Administered 2013-04-29: 15 mg via ORAL
  Filled 2013-04-29: qty 1

## 2013-04-29 NOTE — Sedation Documentation (Signed)
Heparin off.  

## 2013-04-29 NOTE — Progress Notes (Signed)
Pt a/o.vss. Saline lock removed. No complaints of any distress. O2 on 2L/[place]. Discharge instructions given. Prescriptions given. Pt verbalized understanding of instructions. Pt left floor via wheelchair with nursing staff and family.

## 2013-04-29 NOTE — Progress Notes (Signed)
While resting pt oxygen sats with 2L/[place] on are 99%. During ambulating sats are 95% with 2L/. Pt tolerated ambulation well.

## 2013-04-29 NOTE — Sedation Documentation (Signed)
Arrived via [facility] to room IR 2. Pt alert oriented and not shob. On RA pt sats 95-97%.  Heparin at 2000 units per hour and NS at 55ml/hr infusing via pivleft AC.  Pt verbalizes understanding of procedure.

## 2013-04-29 NOTE — Procedures (Signed)
Successful IVC FILTER INSERTION NO COMP STABLE FULL REPORT IN PACS  

## 2013-04-29 NOTE — Progress Notes (Signed)
Subjective: This is a feels better. Her breathing has improved. She has no other new complaints.  Objective: Vital signs in last 24 hours: Temp:  [97.9 F (36.6 C)-99.2 F (37.3 C)] 98.3 F (36.8 C) (04/09 0400) Pulse Rate:  [46-118] 81 (04/09 0600) Resp:  [16-27] 22 (04/09 0600) BP: (105-142)/(74-95) 122/95 mmHg (04/09 0600) SpO2:  [92 %-100 %] 99 % (04/09 0600) Weight:  [161 kg (354 lb 15.1 oz)] 161 kg (354 lb 15.1 oz) (04/09 0500) Weight change: 24.921 kg (54 lb 15.1 oz) Last BM Date: 04/27/13  Intake/Output from previous day: 04/08 0701 - 04/09 0700 In: 1745 [P.O.:1200; I.V.:545] Out: 1200 [Urine:1200]  PHYSICAL EXAM General appearance: alert, cooperative and mild distress Resp: clear to auscultation bilaterally Cardio: regular rate and rhythm, S1, S2 normal, no murmur, click, rub or gallop GI: soft, non-tender; bowel sounds normal; no masses,  no organomegaly Extremities: extremities normal, atraumatic, no cyanosis or edema  Lab Results:    Basic Metabolic Panel:  Recent Labs  04/27/13 1623 04/28/13 0417  NA 140 139  K 3.6* 4.6  CL 102 106  CO2 17* 22  GLUCOSE 200* 98  BUN 9 9  CREATININE 1.17* 0.77  CALCIUM 8.9 8.4   Liver Function Tests:  Recent Labs  04/27/13 1623  AST 69*  ALT 50*  ALKPHOS 74  BILITOT 0.2*  PROT 8.3  ALBUMIN 3.3*   No results found for this basename: LIPASE, AMYLASE,  in the last 72 hours No results found for this basename: AMMONIA,  in the last 72 hours CBC:  Recent Labs  04/27/13 1623 04/28/13 0417 04/29/13 0428  WBC 10.6* 9.5 5.9  NEUTROABS 7.2  --   --   HGB 11.0* 10.2* 9.8*  HCT 35.1* 32.1* 30.2*  MCV 82.2 81.5 82.5  PLT 187 157 149*   Cardiac Enzymes:  Recent Labs  04/27/13 1623  TROPONINI <0.30   BNP:  Recent Labs  04/27/13 1623  PROBNP 48.7   D-Dimer:  Recent Labs  04/27/13 1623  DDIMER 9.04*   CBG:  Recent Labs  04/27/13 2210 04/28/13 0811 04/28/13 1131 04/28/13 1624  04/28/13 2120 04/29/13 0751  GLUCAP 88 74 78 105* 133* 103*   Hemoglobin A1C:  Recent Labs  04/28/13 0417  HGBA1C 5.0   Fasting Lipid Panel: No results found for this basename: CHOL, HDL, LDLCALC, TRIG, CHOLHDL, LDLDIRECT,  in the last 72 hours Thyroid Function Tests: No results found for this basename: TSH, T4TOTAL, FREET4, T3FREE, THYROIDAB,  in the last 72 hours Anemia Panel: No results found for this basename: VITAMINB12, FOLATE, FERRITIN, TIBC, IRON, RETICCTPCT,  in the last 72 hours Coagulation:  Recent Labs  04/28/13 0417  LABPROT 14.7  INR 1.17   Urine Drug Screen: Drugs of Abuse     Component Value Date/Time   LABOPIA NONE DETECTED 04/27/2013 1639   COCAINSCRNUR NONE DETECTED 04/27/2013 1639   LABBENZ NONE DETECTED 04/27/2013 1639   AMPHETMU NONE DETECTED 04/27/2013 1639   THCU NONE DETECTED 04/27/2013 1639   LABBARB NONE DETECTED 04/27/2013 1639    Alcohol Level:  Recent Labs  04/27/13 1623  ETH 94*   Urinalysis:  Recent Labs  04/27/13 1639  COLORURINE YELLOW  LABSPEC <1.005*  PHURINE 5.5  GLUCOSEU NEGATIVE  HGBUR NEGATIVE  BILIRUBINUR NEGATIVE  KETONESUR NEGATIVE  PROTEINUR NEGATIVE  UROBILINOGEN 0.2  NITRITE NEGATIVE  LEUKOCYTESUR NEGATIVE   Misc. Labs:  ABGS No results found for this basename: PHART, PCO2, PO2ART, TCO2, HCO3,  in the last  72 hours CULTURES Recent Results (from the past 240 hour(s))  MRSA PCR SCREENING     Status: None   Collection Time    04/27/13  9:54 PM      Result Value Ref Range Status   MRSA by PCR NEGATIVE  NEGATIVE Final   Comment:            The GeneXpert MRSA Assay (FDA     approved for NASAL specimens     only), is one component of a     comprehensive MRSA colonization     surveillance program. It is not     intended to diagnose MRSA     infection nor to guide or     monitor treatment for     MRSA infections.   Studies/Results: Ct Angio Chest Pe W/cm &/or Wo Cm  04/27/2013   CLINICAL DATA:  Shortness of  breath  EXAM: CT ANGIOGRAPHY CHEST WITH CONTRAST  TECHNIQUE: Multidetector CT imaging of the chest was performed using the standard protocol during bolus administration of intravenous contrast. Multiplanar CT image reconstructions and MIPs were obtained to evaluate the vascular anatomy.  CONTRAST:  159mL OMNIPAQUE IOHEXOL 350 MG/ML SOLN  COMPARISON:  None.  FINDINGS: A large saddle embolus is appreciated. Extending from the distal main pulmonary artery and to the left and right pulmonary arteries with extension into the left upper lobe, left lower lobe and lingula branches. There is right upper lobe, right middle lobe and right lower lobe extension. Areas of subsegmental extension appreciated.  RV LV ratio 1.65. There are areas of intraventricular septal bowing to the right.  The thoracic inlet is unremarkable.  There is no evidence of mediastinal masses or adenopathy.  The lungs are clear.  Visualized upper abdominal viscera are unremarkable.  Review of the MIP images confirms the above findings.  IMPRESSION: Positive for acute PE with CTevidence of right heart strain (RV/LV Ratio = 1.65 ) consistent with at least submassive (intermediate risk) PE. The presence of right heart strain has been associated with an increased risk of morbidity and mortality. Critical Value/emergent results were called by telephone at the time of interpretation on 04/27/2013 at 6:19 PM to Dr. Milton Ferguson , who verbally acknowledged these results.   Electronically Signed   By: Margaree Mackintosh M.D.   On: 04/27/2013 18:20   US Venous Img Lower Bilateral  04/28/2013   CLINICAL DATA:  Sub massive pulmonary emboli on recent chest CTA.  EXAM: BILATERAL LOWER EXTREMITY VENOUS DOPPLER ULTRASOUND  TECHNIQUE: Gray-scale sonography with compression, as well as color and duplex ultrasound, were performed to evaluate the deep venous system from the level of the common femoral vein through the popliteal and proximal calf veins.  COMPARISON:  None   FINDINGS: On the right, Normal compressibility of the common femoral, superficial femoral, and popliteal veins, as well as the proximal calf veins. No filling defects to suggest DVT on grayscale or color Doppler imaging. Doppler waveforms show normal direction of venous flow, normal respiratory phasicity and response to augmentation.  On the left, normal compressibility, phasicity, and augmentation in the common femoral, profunda femoral, and superficial femoral veins. Popliteal vein is incompletely compressible. At least partial flows noted on color Doppler interrogation through the popliteal vein. Visualized calf veins unremarkable. Visualized segments of greater saphenous vein normal in caliber and compressibility.  IMPRESSION: 1. Incompletely occlusive left popliteal DVT. 2. No evidence of right lower extremity DVT.   Electronically Signed   By: Delories Heinz.D.  On: 04/28/2013 14:48   Dg Chest Port 1 View  04/27/2013   CLINICAL DATA:  sob  EXAM: PORTABLE CHEST - 1 VIEW  COMPARISON:  None.  FINDINGS: Low lung volumes. The heart size and mediastinal contours are within normal limits. Both lungs are clear. The visualized skeletal structures are unremarkable.  IMPRESSION: No active disease.   Electronically Signed   By: Margaree Mackintosh M.D.   On: 04/27/2013 16:52    Medications:  Prior to Admission:  No prescriptions prior to admission   Scheduled: . sodium chloride  3 mL Intravenous Q12H  . sodium chloride  3 mL Intravenous Q12H   Continuous: . heparin 2,000 Units/hr (04/29/13 0600)   AXK:PVVZSM chloride, acetaminophen, acetaminophen, alum & mag hydroxide-simeth, ondansetron (ZOFRAN) IV, ondansetron, oxyCODONE, sodium chloride  Assesment: She was admitted with pulmonary embolus. She seems better. Her oxygenation is better. Principal Problem:   PE (pulmonary embolism) Active Problems:   Pleuritic chest pain   SOB (shortness of breath)   Hyperglycemia   Anemia   Tobacco use disorder    Pulmonary embolism    Plan: Continue with current treatments  and plans. I agree one of the newer agents for anti-coagulation would be a good choice    LOS: 2 days   Alonza Bogus 04/29/2013, 8:43 AM

## 2013-04-29 NOTE — Progress Notes (Addendum)
O2 sats at rest are 92% room air. While ambulating at room air oxygen sats dropped to 79% room air. Pt became SOB during ambulation. 2L/[place] O2 reapplied while ambulating, sats came up to 93%.

## 2013-04-29 NOTE — Progress Notes (Signed)
ANTICOAGULATION CONSULT NOTE - follow up  Pharmacy Consult for Heparin  Indication: pulmonary embolus  Allergies  Allergen Reactions  . Sulfa Antibiotics Swelling    Lips swelling, rash, itching   Patient Measurements: Height: 6' (182.9 cm) Weight: 354 lb 15.1 oz (161 kg) IBW/kg (Calculated) : 73.1 Heparin Dosing Weight: 98Kg  Vital Signs: Temp: 98.3 F (36.8 C) (04/09 0400) Temp src: Oral (04/09 0400) BP: 122/95 mmHg (04/09 0600) Pulse Rate: 81 (04/09 0600)  Labs:  Recent Labs  04/27/13 1623 04/28/13 0417 04/28/13 1512 04/29/13 0428  HGB 11.0* 10.2*  --  9.8*  HCT 35.1* 32.1*  --  30.2*  PLT 187 157  --  149*  LABPROT  --  14.7  --   --   INR  --  1.17  --   --   HEPARINUNFRC  --  1.03* 0.70 0.38  CREATININE 1.17* 0.77  --   --   TROPONINI <0.30  --   --   --    Estimated Creatinine Clearance: 166.2 ml/min (by C-G formula based on Cr of 0.77).  Medical History: Past Medical History  Diagnosis Date  . Closed left ankle fracture     August 30 2012  . Obesity   . Tobacco use disorder    Medications:  Infusions:  . heparin 2,000 Units/hr (04/29/13 0600)   Assessment: 38yo morbidly obese female.  Asked to initiate Heparin for pulmonary embolism.  Heparin level is now on target x 2 checks.  CBC is trending down.  Coumadin has been d/c'd per MD.  Pt may be transitioned to Xarelto prior to discharge per reports because pt can get it free and will not need frequent f/u appointments as pt does not have insurance.  Plan is for pt to go to [facility] today for IVC filter placement per report.    Goal of Therapy:  Heparin level 0.3-0.7 units/ml Monitor platelets by anticoagulation protocol: Yes   Plan:  Continue Heparin infusion at 2000 units/hr Heparin level daily CBC daily  Lukah Goswami A Vallarie Fei 04/29/2013,7:43 AM

## 2013-04-29 NOTE — Discharge Summary (Signed)
Physician Discharge Summary  [name] [name] [id number] DOB: 01-02-1976 DOA: 04/27/2013  PCP: No PCP Per Patient  Admit date: 04/27/2013 Discharge date: 04/29/2013  Recommendations for Outpatient Follow-up:  1. F/u with PCP at already scheduled appointment.  Repeat CBC and BMP to ensure labs stable.  Will need close monitoring for cor pulmonale/pulmonary hypertension.   2. F/u labs:  Lupus anticoagulant pending  Discharge Diagnoses:  Principal Problem:   Saddle embolus of pulmonary artery with acute cor pulmonale Active Problems:   PE (pulmonary embolism)   Pleuritic chest pain   SOB (shortness of breath)   Hyperglycemia   Anemia   Tobacco use disorder   Discharge Condition: stable, improved  Diet recommendation: healthy heart  Wt Readings from Last 3 Encounters:  04/29/13 161 kg (354 lb 15.1 oz)  05/02/12 131.543 kg (290 lb)  09/07/11 138.347 kg (305 lb)    History of present illness:   [name] [name] is a 38 y.o. female who presents to the ED with complaints of worsening SOB and pleuritic Chest pain in the early afternoon. She reports having intermittent episodes of Pleuritic chest pain and SOB for the past 2-3 months. She was diagnosed with a Pulmonary Embolism after evaluation in the ED and started on an IV Heparin drip and referred for medical admission. She denies any medical history or family history of clotting disorders, however she does report having 2 miscarriages. She denies any recent travel, but reports being sedentary since she fractured her left ankle in 08/30/2012. She is not on any Hormonal Rx, or OCPs, however she is a smoker.   Hospital Course:   Acute hypoxic respiratory failure, now chronic hypoxic respiratory failure secondary to submassive saddle pulmonary embolism with cor pulmonale. The patient was hypotensive on presentation with systolic blood pressures in the 80s and with mild acute kidney injury. Her CT angiogram the chest demonstrated a saddle  pulmonary embolism with evidence of right heart strain.  Echocardiogram demonstrated preserved ejection fraction of 65-70% with moderate LVH and grade 1 diastolic heart failure, however she had systolic flattening of the interventricular septum consistent with RV pressure overload, severely dilated cavity, and mildly reduced systolic function. Her peak PA pressure was 53 mm of mercury. She was given IV fluids and started on a heparin infusion. Her case was discussed with pulmonology who recommended against the use of lytics since her blood pressure improved with IV fluids. They recommended lysis if she were to develop recurrent hypotension or worsening respiratory failure. Her risk factors for blood clot included  decreased activity since her ankle fracture in August of 2014 and smoking. She was not on any other medications.  A partial hypercoagulable panel was sent, the results of which are negative at this time. Lupus anticoagulant is pending. Protein C, protein S and anti-thrombin 3 levels were not obtained secondary to heparinization and acute clot.  Due to the fact that she has a residual clot in her left popliteal fossa, she underwent IVC filter placement by interventional radiology on 4/9. This filter is intended to be temporary, and should be removed in approximately 2-3 months. She was transitioned to Xarelto and given a card so that she can receive free medication. I personally filled out the forms for her continuing a year long prescription of Xarelto for free. Fortunately, although she required 4 L of oxygen initially, at the time of discharge she requires only 2 L to maintain oxygen saturations greater than 88%.  She was set up for home oxgyen.  Most importantly, she was strongly encouraged to stop smoking and to make sure she does not miss any doses of her anticoagulant.   Labs: Cardiolipin antibodies: Negative Prothrombin gene mutation: Negative Factor V Leiden mutation: Negative Homocystine:  5.1 normal Beta-2 glycoprotein immunoglobulins:  Negative Lupus anticoagulant: Pending  Hyperglycemia, likely secondary to stress and resolved. Her hemoglobin A1c was 5.  Anemia, likely secondary to consumption from pulmonary embolism. Her hemoglobin is now 9.8 mm grams per deciliter. She should have a repeat CBC at her followup appointment with her primary care doctor.  Cigarettes nicotine abuse and dependence with withdrawal. She was strongly counseled to stop smoking as this is one of the primary risk factors for recurrent blood clot. The patient voiced understanding. She was given information about smoking cessation as an outpatient. Her family and friends were encouraged to help her stop smoking immediately.  Consultants:  Pulmonology Procedures:  CT angio chest  ECHO IVC filter placement 4/9 Antibiotics:  None    Discharge Exam: Filed Vitals:   04/29/13 0600  BP: 122/95  Pulse: 81  Temp:   Resp: 22   Filed Vitals:   04/29/13 0300 04/29/13 0400 04/29/13 0500 04/29/13 0600  BP:  105/80  122/95  Pulse: 80 77  81  Temp:  98.3 F (36.8 C)    TempSrc:  Oral    Resp: 24 20  22   Height:      Weight:   161 kg (354 lb 15.1 oz)   SpO2: 100% 99%  99%    General: Obese African American female, No acute distress  HEENT: NCAT, MMM  Cardiovascular: RRR, nl S1, S2 no mrg, 2+ pulses, warm extremities  Respiratory: CTAB, no increased WOB  Abdomen: NABS, soft, NT/ND  MSK: Normal tone and bulk, left calf is slightly tense, and no warmth, erythema , or pitting edema  Neuro: Grossly intact   Discharge Instructions      Discharge Orders   Future Orders Complete By Expires   Call MD for:  difficulty breathing, headache or visual disturbances  As directed    Call MD for:  extreme fatigue  As directed    Call MD for:  hives  As directed    Call MD for:  persistant dizziness or light-headedness  As directed    Call MD for:  persistant nausea and vomiting  As directed    Call MD  for:  severe uncontrolled pain  As directed    Call MD for:  temperature >100.4  As directed    Diet - low sodium heart healthy  As directed    Discharge instructions  As directed    Increase activity slowly  As directed        Medication List         Rivaroxaban 15 MG Tabs tablet  Commonly known as:  XARELTO  Take 1 tablet (15 mg total) by mouth 2 (two) times daily with a meal.     Rivaroxaban 20 MG Tabs tablet  Commonly known as:  XARELTO  Take 1 tablet (20 mg total) by mouth daily with supper.  Start taking on:  05/20/2013       Follow-up Information   Follow up On 05/04/2013. (at 8:30)    Contact information:   [address] [place], [place] [address] [phone]      Schedule an appointment as soon as possible for a visit with HAWKINS,EDWARD L, MD. (If symptoms worsen)    Specialty:  Pulmonary Disease  Contact information:   [place] [place] [id number] [phone]        The results of significant diagnostics from this hospitalization (including imaging, microbiology, ancillary and laboratory) are listed below for reference.    Significant Diagnostic Studies: Ct Angio Chest Pe W/cm &/or Wo Cm  04/27/2013   CLINICAL DATA:  Shortness of breath  EXAM: CT ANGIOGRAPHY CHEST WITH CONTRAST  TECHNIQUE: Multidetector CT imaging of the chest was performed using the standard protocol during bolus administration of intravenous contrast. Multiplanar CT image reconstructions and MIPs were obtained to evaluate the vascular anatomy.  CONTRAST:  133mL OMNIPAQUE IOHEXOL 350 MG/ML SOLN  COMPARISON:  None.  FINDINGS: A large saddle embolus is appreciated. Extending from the distal main pulmonary artery and to the left and right pulmonary arteries with extension into the left upper lobe, left lower lobe and lingula branches. There is right upper lobe, right middle lobe and right lower lobe extension. Areas of subsegmental extension appreciated.  RV LV ratio  1.65. There are areas of intraventricular septal bowing to the right.  The thoracic inlet is unremarkable.  There is no evidence of mediastinal masses or adenopathy.  The lungs are clear.  Visualized upper abdominal viscera are unremarkable.  Review of the MIP images confirms the above findings.  IMPRESSION: Positive for acute PE with CTevidence of right heart strain (RV/LV Ratio = 1.65 ) consistent with at least submassive (intermediate risk) PE. The presence of right heart strain has been associated with an increased risk of morbidity and mortality. Critical Value/emergent results were called by telephone at the time of interpretation on 04/27/2013 at 6:19 PM to Dr. Milton Ferguson , who verbally acknowledged these results.   Electronically Signed   By: Margaree Mackintosh M.D.   On: 04/27/2013 18:20   Ir Ivc Filter Plmt / S&i /img Guid/mod Sed  04/29/2013   CLINICAL DATA:  Sub massive acute saddle pulmonary emboli with right heart strain, DVT  EXAM: ULTRASOUND GUIDANCE FOR VASCULAR ACCESS  IVC CATHETERIZATION AND VENOGRAM  IVC FILTER INSERTION  Date:  4/9/20154/09/2013 9:57 AM  Radiologist:  Jerilynn Mages. Daryll Brod, MD  Guidance:  Ultrasound and fluoroscopic  FLUOROSCOPY TIME:  1 min 36 seconds minutes  MEDICATIONS AND MEDICAL HISTORY: 3 mg Versed, 100 mcg fentanyl  ANESTHESIA/SEDATION: 15 min  CONTRAST:  58mL OMNIPAQUE IOHEXOL 300 MG/ML  SOLN  COMPLICATIONS: No immediate  PROCEDURE: Informed consent was obtained from the patient following explanation of the procedure, risks, benefits and alternatives. The patient understands, agrees and consents for the procedure. All questions were addressed. A time out was performed.  Maximal barrier sterile technique utilized including caps, mask, sterile gowns, sterile gloves, large sterile drape, hand hygiene, and betadine prep.  Under sterile condition and local anesthesia, right internal jugular venous access was performed with ultrasound. Over a guide wire, the IVC filter delivery  sheath and inner dilator were advanced into the IVC just above the IVC bifurcation. Contrast injection was performed for an IVC venogram.  IVC VENOGRAM: The IVC is patent. No evidence of thrombus, stenosis, or occlusion. No variant venous anatomy. The renal veins are identified at L1.  IVC diameter measured below the renal veins is 27.8 mm.  IVC FILTER INSERTION: Through the delivery sheath, the Bard Denali IVC filter was deployed in the infrarenal IVC at the level just below the renal veins and above the IVC bifurcation. Contrast injection confirmed position. There is good apposition of the filter against the IVC.  The  delivery sheath was removed and hemostasis was obtained with compression for 5 minutes. The patient tolerated the procedure well. No immediate complications.  IMPRESSION: Ultrasound and fluoroscopically guided infrarenal IVC filter insertion.   Electronically Signed   By: Daryll Brod M.D.   On: 04/29/2013 10:03   US Venous Img Lower Bilateral  04/28/2013   CLINICAL DATA:  Sub massive pulmonary emboli on recent chest CTA.  EXAM: BILATERAL LOWER EXTREMITY VENOUS DOPPLER ULTRASOUND  TECHNIQUE: Gray-scale sonography with compression, as well as color and duplex ultrasound, were performed to evaluate the deep venous system from the level of the common femoral vein through the popliteal and proximal calf veins.  COMPARISON:  None  FINDINGS: On the right, Normal compressibility of the common femoral, superficial femoral, and popliteal veins, as well as the proximal calf veins. No filling defects to suggest DVT on grayscale or color Doppler imaging. Doppler waveforms show normal direction of venous flow, normal respiratory phasicity and response to augmentation.  On the left, normal compressibility, phasicity, and augmentation in the common femoral, profunda femoral, and superficial femoral veins. Popliteal vein is incompletely compressible. At least partial flows noted on color Doppler interrogation  through the popliteal vein. Visualized calf veins unremarkable. Visualized segments of greater saphenous vein normal in caliber and compressibility.  IMPRESSION: 1. Incompletely occlusive left popliteal DVT. 2. No evidence of right lower extremity DVT.   Electronically Signed   By: Arne Cleveland M.D.   On: 04/28/2013 14:48   Dg Chest Port 1 View  04/27/2013   CLINICAL DATA:  sob  EXAM: PORTABLE CHEST - 1 VIEW  COMPARISON:  None.  FINDINGS: Low lung volumes. The heart size and mediastinal contours are within normal limits. Both lungs are clear. The visualized skeletal structures are unremarkable.  IMPRESSION: No active disease.   Electronically Signed   By: Margaree Mackintosh M.D.   On: 04/27/2013 16:52    Microbiology: Recent Results (from the past 240 hour(s))  MRSA PCR SCREENING     Status: None   Collection Time    04/27/13  9:54 PM      Result Value Ref Range Status   MRSA by PCR NEGATIVE  NEGATIVE Final   Comment:            The GeneXpert MRSA Assay (FDA     approved for NASAL specimens     only), is one component of a     comprehensive MRSA colonization     surveillance program. It is not     intended to diagnose MRSA     infection nor to guide or     monitor treatment for     MRSA infections.     Labs: Basic Metabolic Panel:  Recent Labs Lab 04/27/13 1623 04/28/13 0417  NA 140 139  K 3.6* 4.6  CL 102 106  CO2 17* 22  GLUCOSE 200* 98  BUN 9 9  CREATININE 1.17* 0.77  CALCIUM 8.9 8.4   Liver Function Tests:  Recent Labs Lab 04/27/13 1623  AST 69*  ALT 50*  ALKPHOS 74  BILITOT 0.2*  PROT 8.3  ALBUMIN 3.3*   No results found for this basename: LIPASE, AMYLASE,  in the last 168 hours No results found for this basename: AMMONIA,  in the last 168 hours CBC:  Recent Labs Lab 04/27/13 1623 04/28/13 0417 04/29/13 0428  WBC 10.6* 9.5 5.9  NEUTROABS 7.2  --   --   HGB 11.0* 10.2* 9.8*  HCT 35.1* 32.1* 30.2*  MCV 82.2 81.5 82.5  PLT 187 157 149*   Cardiac  Enzymes:  Recent Labs Lab 04/27/13 1623  TROPONINI <0.30   BNP: BNP (last 3 results)  Recent Labs  04/27/13 1623  PROBNP 48.7   CBG:  Recent Labs Lab 04/28/13 0811 04/28/13 1131 04/28/13 1624 04/28/13 2120 04/29/13 0751  GLUCAP 74 78 105* 133* 103*    Time coordinating discharge: 35 minutes  Signed:  Janece Canterbury  [facility] 04/29/2013, 5:15 PM

## 2013-04-29 NOTE — Progress Notes (Signed)
[facility] PROGRESS NOTE  [name] [name] TFT:[id number] DOB: 30-Oct-1975 DOA: 04/27/2013 PCP: No PCP Per Patient  Assessment/Plan  Acute hypoxic respiratory failure secondary to submassive PE.  Patient was hypotensive on presentation with a large saddle embolism appreciated on CT with evidence of right heart strain, however BP improved quickly with IVF -  Discussed case with pulmonology who stated that since the patient is clinically better in terms of blood pressure with IV fluids, defer her escalation of care for now. If she were to become repeatedly hypotensive or hypoxic, then can consider lysis at that time. -  Hypercoagulable workup excluding protein C&S and antithrombin III which are affected by anticoagulation and recent blood clot.  Homocysteine 5.1,  -  Would defer initiation of Coumadin if that is our choice of anticoagulation for at least 24 hours after initiation of heparin -  Would consider Xarelto instead as this medication can be available for free for patients without insurance and is not require as many followup appointments for her blood work.   -  Case management to find out about Xarelto card and to establish a primary care doctor for this patient -  Echo:  EF 65 -70%, grade 1 DD, right ventricle with systolic flattening of the IV septum c/w RV pressure overload.  Cavity severely dilated and systolic function mildly reduced.  PA peak pressure 73mmHg -  Strongly advised to quit smoking  Hyperglycemia was likely secondary to stress, and has since resolved -  A1c 5 -  D/c SSI   Anemia may be due to consumption from pulmonary embolism.  Hemoglobin 9.8mg /dl.   -  Repeat CBC at f/u appointment  Cigarettes nicotine abuse and dependence with withdrawal -  Counseled cessation -  Continue nicotine patch  Diet:  Diabetic  Access:  PIV IVF:  off Proph:  Heparin gtt  Code Status: full Family Communication: patient and her long-time boyfriend Disposition Plan:  Pending  xarelto prescription definitely obtainable, forms sent in for refills, s/p IVC filter, possibly later today.    Consultants: Pulmonology by phone  Procedures:  CT angio chest  ECHO  Antibiotics:  None   HPI/Subjective:  Persistent shortness of breath.  Denies hemoptysis  Objective: Filed Vitals:   04/29/13 0300 04/29/13 0400 04/29/13 0500 04/29/13 0600  BP:  105/80  122/95  Pulse: 80 77  81  Temp:  98.3 F (36.8 C)    TempSrc:  Oral    Resp: 24 20  22   Height:      Weight:   161 kg (354 lb 15.1 oz)   SpO2: 100% 99%  99%    Intake/Output Summary (Last 24 hours) at 04/29/13 7062 Last data filed at 04/29/13 0600  Gross per 24 hour  Intake   1505 ml  Output   1200 ml  Net    305 ml   Filed Weights   04/27/13 1547 04/29/13 0500  Weight: 136.079 kg (300 lb) 161 kg (354 lb 15.1 oz)    Exam:   General:  Obese African American female, No acute distress  HEENT:  NCAT, MMM  Cardiovascular:  RRR, nl S1, S2 no mrg, 2+ pulses, warm extremities  Respiratory:  CTAB, no increased WOB  Abdomen:   NABS, soft, NT/ND  MSK:   Normal tone and bulk, left calf is slightly tense, and no warmth, erythema , or pitting edema  Neuro:  Grossly intact  Data Reviewed: Basic Metabolic Panel:  Recent Labs Lab 04/27/13 1623 04/28/13 0417  NA  140 139  K 3.6* 4.6  CL 102 106  CO2 17* 22  GLUCOSE 200* 98  BUN 9 9  CREATININE 1.17* 0.77  CALCIUM 8.9 8.4   Liver Function Tests:  Recent Labs Lab 04/27/13 1623  AST 69*  ALT 50*  ALKPHOS 74  BILITOT 0.2*  PROT 8.3  ALBUMIN 3.3*   No results found for this basename: LIPASE, AMYLASE,  in the last 168 hours No results found for this basename: AMMONIA,  in the last 168 hours CBC:  Recent Labs Lab 04/27/13 1623 04/28/13 0417 04/29/13 0428  WBC 10.6* 9.5 5.9  NEUTROABS 7.2  --   --   HGB 11.0* 10.2* 9.8*  HCT 35.1* 32.1* 30.2*  MCV 82.2 81.5 82.5  PLT 187 157 149*   Cardiac Enzymes:  Recent Labs Lab  04/27/13 1623  TROPONINI <0.30   BNP (last 3 results)  Recent Labs  04/27/13 1623  PROBNP 48.7   CBG:  Recent Labs Lab 04/28/13 0811 04/28/13 1131 04/28/13 1624 04/28/13 2120 04/29/13 0751  GLUCAP 74 78 105* 133* 103*    Recent Results (from the past 240 hour(s))  MRSA PCR SCREENING     Status: None   Collection Time    04/27/13  9:54 PM      Result Value Ref Range Status   MRSA by PCR NEGATIVE  NEGATIVE Final   Comment:            The GeneXpert MRSA Assay (FDA     approved for NASAL specimens     only), is one component of a     comprehensive MRSA colonization     surveillance program. It is not     intended to diagnose MRSA     infection nor to guide or     monitor treatment for     MRSA infections.     Studies: Ct Angio Chest Pe W/cm &/or Wo Cm  04/27/2013   CLINICAL DATA:  Shortness of breath  EXAM: CT ANGIOGRAPHY CHEST WITH CONTRAST  TECHNIQUE: Multidetector CT imaging of the chest was performed using the standard protocol during bolus administration of intravenous contrast. Multiplanar CT image reconstructions and MIPs were obtained to evaluate the vascular anatomy.  CONTRAST:  180mL OMNIPAQUE IOHEXOL 350 MG/ML SOLN  COMPARISON:  None.  FINDINGS: A large saddle embolus is appreciated. Extending from the distal main pulmonary artery and to the left and right pulmonary arteries with extension into the left upper lobe, left lower lobe and lingula branches. There is right upper lobe, right middle lobe and right lower lobe extension. Areas of subsegmental extension appreciated.  RV LV ratio 1.65. There are areas of intraventricular septal bowing to the right.  The thoracic inlet is unremarkable.  There is no evidence of mediastinal masses or adenopathy.  The lungs are clear.  Visualized upper abdominal viscera are unremarkable.  Review of the MIP images confirms the above findings.  IMPRESSION: Positive for acute PE with CTevidence of right heart strain (RV/LV Ratio = 1.65  ) consistent with at least submassive (intermediate risk) PE. The presence of right heart strain has been associated with an increased risk of morbidity and mortality. Critical Value/emergent results were called by telephone at the time of interpretation on 04/27/2013 at 6:19 PM to Dr. Milton Ferguson , who verbally acknowledged these results.   Electronically Signed   By: Margaree Mackintosh M.D.   On: 04/27/2013 18:20   US Venous Img Lower Bilateral  04/28/2013   CLINICAL  DATA:  Sub massive pulmonary emboli on recent chest CTA.  EXAM: BILATERAL LOWER EXTREMITY VENOUS DOPPLER ULTRASOUND  TECHNIQUE: Gray-scale sonography with compression, as well as color and duplex ultrasound, were performed to evaluate the deep venous system from the level of the common femoral vein through the popliteal and proximal calf veins.  COMPARISON:  None  FINDINGS: On the right, Normal compressibility of the common femoral, superficial femoral, and popliteal veins, as well as the proximal calf veins. No filling defects to suggest DVT on grayscale or color Doppler imaging. Doppler waveforms show normal direction of venous flow, normal respiratory phasicity and response to augmentation.  On the left, normal compressibility, phasicity, and augmentation in the common femoral, profunda femoral, and superficial femoral veins. Popliteal vein is incompletely compressible. At least partial flows noted on color Doppler interrogation through the popliteal vein. Visualized calf veins unremarkable. Visualized segments of greater saphenous vein normal in caliber and compressibility.  IMPRESSION: 1. Incompletely occlusive left popliteal DVT. 2. No evidence of right lower extremity DVT.   Electronically Signed   By: Arne Cleveland M.D.   On: 04/28/2013 14:48   Dg Chest Port 1 View  04/27/2013   CLINICAL DATA:  sob  EXAM: PORTABLE CHEST - 1 VIEW  COMPARISON:  None.  FINDINGS: Low lung volumes. The heart size and mediastinal contours are within normal limits.  Both lungs are clear. The visualized skeletal structures are unremarkable.  IMPRESSION: No active disease.   Electronically Signed   By: Margaree Mackintosh M.D.   On: 04/27/2013 16:52    Scheduled Meds: . insulin aspart  0-9 Units Subcutaneous TID WC  . sodium chloride  3 mL Intravenous Q12H  . sodium chloride  3 mL Intravenous Q12H   Continuous Infusions: . heparin 2,000 Units/hr (04/29/13 0600)    Principal Problem:   PE (pulmonary embolism) Active Problems:   Pleuritic chest pain   SOB (shortness of breath)   Hyperglycemia   Anemia   Tobacco use disorder   Pulmonary embolism    Time spent: 30 min    [facility] Pager [phone]. If 7PM-7AM, please contact night-coverage at [email or web address], password [facility] 04/29/2013, 8:08 AM  LOS: 2 days

## 2013-04-29 NOTE — Discharge Instructions (Addendum)
Information on my medicine - XARELTO (rivaroxaban)  This medication education was reviewed with me or my healthcare representative as part of my discharge preparation.  The pharmacist that spoke with me during my hospital stay was:  Ena Dawley, [place]? Xarelto was prescribed to treat blood clots that may have been found in the veins of your legs (deep vein thrombosis) or in your lungs (pulmonary embolism) and to reduce the risk of them occurring again.  What do you need to know about Xarelto? The starting dose is one 15 mg tablet taken TWICE daily with food for the FIRST 21 DAYS then on (enter date)  05/20/2013  the dose is changed to one 20 mg tablet taken ONCE A DAY with your evening meal.  DO NOT stop taking Xarelto without talking to the health care provider who prescribed the medication.  Refill your prescription for 20 mg tablets before you run out.  After discharge, you should have regular check-up appointments with your healthcare provider that is prescribing your Xarelto.  In the future your dose may need to be changed if your kidney function changes by a significant amount.  What do you do if you miss a dose? If you are taking Xarelto TWICE DAILY and you miss a dose, take it as soon as you remember. You may take two 15 mg tablets (total 30 mg) at the same time then resume your regularly scheduled 15 mg twice daily the next day.  If you are taking Xarelto ONCE DAILY and you miss a dose, take it as soon as you remember on the same day then continue your regularly scheduled once daily regimen the next day. Do not take two doses of Xarelto at the same time.   Important Safety Information Xarelto is a blood thinner medicine that can cause bleeding. You should call your healthcare provider right away if you experience any of the following:   Bleeding from an injury or your nose that does not stop.   Unusual colored urine (red or dark brown) or  unusual colored stools (red or black).   Unusual bruising for unknown reasons.   A serious fall or if you hit your head (even if there is no bleeding).  Some medicines may interact with Xarelto and might increase your risk of bleeding while on Xarelto. To help avoid this, consult your healthcare provider or pharmacist prior to using any new prescription or non-prescription medications, including herbals, vitamins, non-steroidal anti-inflammatory drugs (NSAIDs) and supplements.  This website has more information on Xarelto: [email or web address]

## 2013-04-29 NOTE — Progress Notes (Signed)
ANTICOAGULATION CONSULT NOTE - follow up  Pharmacy Consult for Heparin >> Xarelto  Indication: pulmonary embolus  Allergies  Allergen Reactions  . Sulfa Antibiotics Swelling    Lips swelling, rash, itching   Patient Measurements: Height: 6' (182.9 cm) Weight: 354 lb 15.1 oz (161 kg) IBW/kg (Calculated) : 73.1 Heparin Dosing Weight: 98Kg  Vital Signs: Temp: 98.3 F (36.8 C) (04/09 0400) Temp src: Oral (04/09 0400) BP: 142/92 mmHg (04/09 0954) Pulse Rate: 92 (04/09 0954)  Labs:  Recent Labs  04/27/13 1623 04/28/13 0417 04/28/13 1512 04/29/13 0428  HGB 11.0* 10.2*  --  9.8*  HCT 35.1* 32.1*  --  30.2*  PLT 187 157  --  149*  LABPROT  --  14.7  --   --   INR  --  1.17  --   --   HEPARINUNFRC  --  1.03* 0.70 0.38  CREATININE 1.17* 0.77  --   --   TROPONINI <0.30  --   --   --    Estimated Creatinine Clearance: 166.2 ml/min (by C-G formula based on Cr of 0.77).  Medical History: Past Medical History  Diagnosis Date  . Closed left ankle fracture     August 30 2012  . Obesity   . Tobacco use disorder    Medications:  Infusions:  . heparin 2,000 Units/hr (04/29/13 0600)   Assessment: 37yo morbidly obese female admitted with pulmonary embolism. Pt was initially started on IV Heparin.  Pt had IVC filter placed today and will transition to Xarelto.  CBC is trending down.    Goal of Therapy:  Heparin level 0.3-0.7 units/ml Monitor platelets by anticoagulation protocol: Yes   Plan:  Xarelto 15mg  PO BID x 21 days (starting today at 5pm) then Xarelto 20mg  PO daily with supper thereafter D/C Heparin infusion today at 5pm when Xarelto given Provide Xarelto education handout  Kalisa Girtman A Merelyn Klump 04/29/2013,1:37 PM

## 2013-04-30 LAB — GLUCOSE, CAPILLARY: Glucose-Capillary: 77 mg/dL (ref 70–99)

## 2013-05-28 ENCOUNTER — Encounter (HOSPITAL_COMMUNITY): Payer: Self-pay

## 2013-05-28 ENCOUNTER — Encounter (HOSPITAL_COMMUNITY): Payer: Self-pay | Attending: Hematology and Oncology

## 2013-05-28 VITALS — BP 132/93 | HR 93 | Temp 98.5°F | Resp 20 | Ht 69.0 in | Wt 341.0 lb

## 2013-05-28 DIAGNOSIS — Z21 Asymptomatic human immunodeficiency virus [HIV] infection status: Secondary | ICD-10-CM | POA: Insufficient documentation

## 2013-05-28 DIAGNOSIS — I2692 Saddle embolus of pulmonary artery without acute cor pulmonale: Secondary | ICD-10-CM | POA: Insufficient documentation

## 2013-05-28 DIAGNOSIS — I824Z9 Acute embolism and thrombosis of unspecified deep veins of unspecified distal lower extremity: Secondary | ICD-10-CM

## 2013-05-28 DIAGNOSIS — I82409 Acute embolism and thrombosis of unspecified deep veins of unspecified lower extremity: Secondary | ICD-10-CM | POA: Insufficient documentation

## 2013-05-28 DIAGNOSIS — Z7901 Long term (current) use of anticoagulants: Secondary | ICD-10-CM | POA: Insufficient documentation

## 2013-05-28 DIAGNOSIS — F17200 Nicotine dependence, unspecified, uncomplicated: Secondary | ICD-10-CM | POA: Insufficient documentation

## 2013-05-28 DIAGNOSIS — I2609 Other pulmonary embolism with acute cor pulmonale: Secondary | ICD-10-CM

## 2013-05-28 DIAGNOSIS — I10 Essential (primary) hypertension: Secondary | ICD-10-CM | POA: Insufficient documentation

## 2013-05-28 DIAGNOSIS — I2602 Saddle embolus of pulmonary artery with acute cor pulmonale: Secondary | ICD-10-CM

## 2013-05-28 LAB — D-DIMER, QUANTITATIVE (NOT AT ARMC): D DIMER QUANT: 1.18 ug{FEU}/mL — AB (ref 0.00–0.48)

## 2013-05-28 LAB — FERRITIN: Ferritin: 8 ng/mL — ABNORMAL LOW (ref 10–291)

## 2013-05-28 MED ORDER — OXYCODONE HCL 5 MG PO TABS: 5.0000 mg | ORAL_TABLET | ORAL | Status: DC | PRN

## 2013-05-28 NOTE — Progress Notes (Signed)
[place] A. [place], M.D.  NEW PATIENT EVALUATION   Name: [name] [name] Date: 05/28/2013 MRN: [id number] DOB: Jan 19, 1976  PCP: Yevette Edwards, NP   REFERRING PHYSICIAN: Yevette Edwards, NP  REASON FOR REFERRAL: Pulmonary embolism.     HISTORY OF PRESENT ILLNESS:[name] [name] is a 38 y.o. female who is referred by her family physician because of recent diagnosis of pulmonary embolism. Diagnoses made by CT scan performed on 04/27/2013 at which time a saddle embolism was diagnosed. She had an IVC filter inserted on 04/29/2013 with demonstration of a left popliteal incomplete occlusion on lower 70 ultrasound done on 04/28/2013. She has undergone a complete thrombophilia workup with no evidence of congenital or acquired abnormality to explain her thrombus. The patient has been pregnant twice but had 2 miscarriages. Her father does have a clotting disorder for which he takes warfarin. She denies any epistaxis, melena, hematochezia, or hemoptysis but has had considerable vaginal bleeding during menstrual periods. Appetite is good with no nausea or vomiting but with occasional chest discomfort. She does use oxygen during the day as needed and at night. She denies any fever, night sweats, incontinence, headache, or focal weakness.    PAST MEDICAL HISTORY:  has a past medical history of Closed left ankle fracture; Obesity; Tobacco use disorder; and HIV infection.     PAST SURGICAL HISTORY:History reviewed. No pertinent past surgical history.   CURRENT MEDICATIONS: has a current medication list which includes the following prescription(s): lisinopril, oxycodone, and rivaroxaban.   ALLERGIES: Sulfa antibiotics   SOCIAL HISTORY:  reports that she has been smoking Cigarettes.  She has been smoking about 0.00 packs per day. She does not have any smokeless tobacco history on file. She reports that she drinks alcohol. She reports that she does not use  illicit drugs.   FAMILY HISTORY: family history includes Diabetes in her paternal aunt and paternal uncle; Hypertension in her mother.    REVIEW OF SYSTEMS:  Other than that discussed above is noncontributory.    PHYSICAL EXAM:  height is 5' 9"  (1.753 m) and weight is 341 lb (154.677 kg). Her oral temperature is 98.5 F (36.9 C). Her blood pressure is 132/93 and her pulse is 93. Her respiration is 20 and oxygen saturation is 100%.    GENERAL:alert, no distress and comfortable. Morbidly obese. SKIN: skin color, texture, turgor are normal, no rashes or significant lesions EYES: normal, Conjunctiva are pink and non-injected, sclera clear OROPHARYNX:no exudate, no erythema and lips, buccal mucosa, and tongue normal  NECK: supple, thyroid normal size, non-tender, without nodularity CHEST: Increased AP diameter with no breast masses. LYMPH:  no palpable lymphadenopathy in the cervical, axillary or inguinal LUNGS: clear to auscultation and percussion with normal breathing effort HEART: regular rate & rhythm and no murmurs. P2 is barely audible at the apex. ABDOMEN:abdomen soft, non-tender and normal bowel sounds MUSCULOSKELETALl:no cyanosis of digits, no clubbing or edema  NEURO: alert & oriented x 3 with fluent speech, no focal motor/sensory deficits    LABORATORY DATA:  Office Visit on 05/28/2013  Component Date Value Ref Range Status  . D-Dimer, Quant 05/28/2013 1.18* 0.00 - 0.48 ug/mL-FEU Final   Comment:                                 AT THE INHOUSE ESTABLISHED CUTOFF  VALUE OF 0.48 ug/mL FEU,                          THIS ASSAY HAS BEEN DOCUMENTED                          IN THE LITERATURE TO HAVE                          A SENSITIVITY AND NEGATIVE                          PREDICTIVE VALUE OF AT LEAST                          98 TO 99%.  THE TEST RESULT                          SHOULD BE CORRELATED WITH                          AN ASSESSMENT OF THE  CLINICAL                          PROBABILITY OF DVT / VTE.  Admission on 04/27/2013, Discharged on 04/29/2013  Component Date Value Ref Range Status  . WBC 04/27/2013 10.6* 4.0 - 10.5 K/uL Final  . RBC 04/27/2013 4.27  3.87 - 5.11 MIL/uL Final  . Hemoglobin 04/27/2013 11.0* 12.0 - 15.0 g/dL Final  . HCT 04/27/2013 35.1* 36.0 - 46.0 % Final  . MCV 04/27/2013 82.2  78.0 - 100.0 fL Final  . MCH 04/27/2013 25.8* 26.0 - 34.0 pg Final  . MCHC 04/27/2013 31.3  30.0 - 36.0 g/dL Final  . RDW 04/27/2013 18.2* 11.5 - 15.5 % Final  . Platelets 04/27/2013 187  150 - 400 K/uL Final  . Neutrophils Relative % 04/27/2013 68  43 - 77 % Final  . Neutro Abs 04/27/2013 7.2  1.7 - 7.7 K/uL Final  . Lymphocytes Relative 04/27/2013 27  12 - 46 % Final  . Lymphs Abs 04/27/2013 2.8  0.7 - 4.0 K/uL Final  . Monocytes Relative 04/27/2013 4  3 - 12 % Final  . Monocytes Absolute 04/27/2013 0.5  0.1 - 1.0 K/uL Final  . Eosinophils Relative 04/27/2013 1  0 - 5 % Final  . Eosinophils Absolute 04/27/2013 0.1  0.0 - 0.7 K/uL Final  . Basophils Relative 04/27/2013 0  0 - 1 % Final  . Basophils Absolute 04/27/2013 0.0  0.0 - 0.1 K/uL Final  . Sodium 04/27/2013 140  137 - 147 mEq/L Final  . Potassium 04/27/2013 3.6* 3.7 - 5.3 mEq/L Final  . Chloride 04/27/2013 102  96 - 112 mEq/L Final  . CO2 04/27/2013 17* 19 - 32 mEq/L Final  . Glucose, Bld 04/27/2013 200* 70 - 99 mg/dL Final  . BUN 04/27/2013 9  6 - 23 mg/dL Final  . Creatinine, Ser 04/27/2013 1.17* 0.50 - 1.10 mg/dL Final  . Calcium 04/27/2013 8.9  8.4 - 10.5 mg/dL Final  . Total Protein 04/27/2013 8.3  6.0 - 8.3 g/dL Final  . Albumin 04/27/2013 3.3* 3.5 - 5.2 g/dL Final  . AST 04/27/2013 69* 0 - 37 U/L Final  . ALT 04/27/2013 50* 0 -  35 U/L Final  . Alkaline Phosphatase 04/27/2013 74  39 - 117 U/L Final  . Total Bilirubin 04/27/2013 0.2* 0.3 - 1.2 mg/dL Final  . GFR calc non Af Amer 04/27/2013 59* >90 mL/min Final  . GFR calc Af Amer 04/27/2013 69* >90 mL/min  Final   Comment: (NOTE)                          The eGFR has been calculated using the CKD EPI equation.                          This calculation has not been validated in all clinical situations.                          eGFR's persistently <90 mL/min signify possible Chronic Kidney                          Disease.  . Troponin I 04/27/2013 <0.30  <0.30 ng/mL Final   Comment:                                 Due to the release kinetics of cTnI,                          a negative result within the first hours                          of the onset of symptoms does not rule out                          myocardial infarction with certainty.                          If myocardial infarction is still suspected,                          repeat the test at appropriate intervals.  . Color, Urine 04/27/2013 YELLOW  YELLOW Final  . APPearance 04/27/2013 CLEAR  CLEAR Final  . Specific Gravity, Urine 04/27/2013 <1.005* 1.005 - 1.030 Final  . pH 04/27/2013 5.5  5.0 - 8.0 Final  . Glucose, UA 04/27/2013 NEGATIVE  NEGATIVE mg/dL Final  . Hgb urine dipstick 04/27/2013 NEGATIVE  NEGATIVE Final  . Bilirubin Urine 04/27/2013 NEGATIVE  NEGATIVE Final  . Ketones, ur 04/27/2013 NEGATIVE  NEGATIVE mg/dL Final  . Protein, ur 04/27/2013 NEGATIVE  NEGATIVE mg/dL Final  . Urobilinogen, UA 04/27/2013 0.2  0.0 - 1.0 mg/dL Final  . Nitrite 04/27/2013 NEGATIVE  NEGATIVE Final  . Leukocytes, UA 04/27/2013 NEGATIVE  NEGATIVE Final   MICROSCOPIC NOT DONE ON URINES WITH NEGATIVE PROTEIN, BLOOD, LEUKOCYTES, NITRITE, OR GLUCOSE <1000 mg/dL.  . Preg Test, Ur 04/27/2013 NEGATIVE  NEGATIVE Final   Comment:                                 THE SENSITIVITY OF THIS                          METHODOLOGY  IS >20 mIU/mL.  Marland Kitchen D-Dimer, Quant 04/27/2013 9.04* 0.00 - 0.48 ug/mL-FEU Final   Comment:                                 AT THE INHOUSE ESTABLISHED CUTOFF                          VALUE OF 0.48 ug/mL FEU,                           THIS ASSAY HAS BEEN DOCUMENTED                          IN THE LITERATURE TO HAVE                          A SENSITIVITY AND NEGATIVE                          PREDICTIVE VALUE OF AT LEAST                          98 TO 99%.  THE TEST RESULT                          SHOULD BE CORRELATED WITH                          AN ASSESSMENT OF THE CLINICAL                          PROBABILITY OF DVT / VTE.  Marland Kitchen Lactic Acid, Venous 04/27/2013 6.2* 0.5 - 2.2 mmol/L Final  . Alcohol, Ethyl (B) 04/27/2013 94* 0 - 11 mg/dL Final   Comment:                                 LOWEST DETECTABLE LIMIT FOR                          SERUM ALCOHOL IS 11 mg/dL                          FOR MEDICAL PURPOSES ONLY  . Pro B Natriuretic peptide (BNP) 04/27/2013 48.7  0 - 125 pg/mL Final  . Opiates 04/27/2013 NONE DETECTED  NONE DETECTED Final  . Cocaine 04/27/2013 NONE DETECTED  NONE DETECTED Final  . Benzodiazepines 04/27/2013 NONE DETECTED  NONE DETECTED Final  . Amphetamines 04/27/2013 NONE DETECTED  NONE DETECTED Final  . Tetrahydrocannabinol 04/27/2013 NONE DETECTED  NONE DETECTED Final  . Barbiturates 04/27/2013 NONE DETECTED  NONE DETECTED Final   Comment:                                 DRUG SCREEN FOR MEDICAL PURPOSES                          ONLY.  IF CONFIRMATION IS NEEDED  FOR ANY PURPOSE, NOTIFY LAB                          WITHIN 5 DAYS.                                                          LOWEST DETECTABLE LIMITS                          FOR URINE DRUG SCREEN                          Drug Class       Cutoff (ng/mL)                          Amphetamine      1000                          Barbiturate      200                          Benzodiazepine   200                          Tricyclics       938                          Opiates          300                          Cocaine          300                          THC              50  . Heparin Unfractionated 04/28/2013  1.03* 0.30 - 0.70 IU/mL Final   Comment:                                 IF HEPARIN RESULTS ARE BELOW                          EXPECTED VALUES, AND PATIENT                          DOSAGE HAS BEEN CONFIRMED,                          SUGGEST FOLLOW UP TESTING                          OF ANTITHROMBIN III LEVELS.  Marland Kitchen Prothrombin Time 04/28/2013 14.7  11.6 - 15.2 seconds Final  . INR 04/28/2013 1.17  0.00 - 1.49 Final  . MRSA by PCR 04/27/2013 NEGATIVE  NEGATIVE Final   Comment:  The GeneXpert MRSA Assay (FDA                          approved for NASAL specimens                          only), is one component of a                          comprehensive MRSA colonization                          surveillance program. It is not                          intended to diagnose MRSA                          infection nor to guide or                          monitor treatment for                          MRSA infections.  . Sodium 04/28/2013 139  137 - 147 mEq/L Final  . Potassium 04/28/2013 4.6  3.7 - 5.3 mEq/L Final   DELTA CHECK NOTED  . Chloride 04/28/2013 106  96 - 112 mEq/L Final  . CO2 04/28/2013 22  19 - 32 mEq/L Final  . Glucose, Bld 04/28/2013 98  70 - 99 mg/dL Final  . BUN 04/28/2013 9  6 - 23 mg/dL Final  . Creatinine, Ser 04/28/2013 0.77  0.50 - 1.10 mg/dL Final  . Calcium 04/28/2013 8.4  8.4 - 10.5 mg/dL Final  . GFR calc non Af Amer 04/28/2013 >90  >90 mL/min Final  . GFR calc Af Amer 04/28/2013 >90  >90 mL/min Final   Comment: (NOTE)                          The eGFR has been calculated using the CKD EPI equation.                          This calculation has not been validated in all clinical situations.                          eGFR's persistently <90 mL/min signify possible Chronic Kidney                          Disease.  . WBC 04/28/2013 9.5  4.0 - 10.5 K/uL Final  . RBC 04/28/2013 3.94  3.87 - 5.11 MIL/uL Final  . Hemoglobin 04/28/2013  10.2* 12.0 - 15.0 g/dL Final  . HCT 04/28/2013 32.1* 36.0 - 46.0 % Final  . MCV 04/28/2013 81.5  78.0 - 100.0 fL Final  . MCH 04/28/2013 25.9* 26.0 - 34.0 pg Final  . MCHC 04/28/2013 31.8  30.0 - 36.0 g/dL Final  . RDW 04/28/2013 18.4* 11.5 - 15.5 % Final  . Platelets 04/28/2013 157  150 - 400 K/uL Final  . Hemoglobin A1C 04/28/2013 5.0  <5.7 % Final   Comment: (NOTE)  According to the ADA Clinical Practice Recommendations for 2011, when                          HbA1c is used as a screening test:                           >=6.5%   Diagnostic of Diabetes Mellitus                                    (if abnormal result is confirmed)                          5.7-6.4%   Increased risk of developing Diabetes Mellitus                          References:Diagnosis and Classification of Diabetes Mellitus,Diabetes                          PXTG,6269,48(NIOEV 1):S62-S69 and Standards of Medical Care in                                  Diabetes - 2011,Diabetes OJJK,0938,18 (Suppl 1):S11-S61.  . Mean Plasma Glucose 04/28/2013 97  <117 mg/dL Final   Performed at [facility]  . Glucose-Capillary 04/27/2013 88  70 - 99 mg/dL Final  . PTT Lupus Anticoagulant 04/28/2013 142.7* 28.0 - 43.0 secs Final  . PTTLA Confirmation 04/28/2013 5.0  <8.0 secs Final  . PTTLA 4:1 Mix 04/28/2013 155.4* 28.0 - 43.0 secs Final  . Drvvt 04/28/2013 29.4  <42.9 secs Final  . Drvvt confirmation 04/28/2013 NOT APPL  <1.11 Ratio Final  . dRVVT Incubated 1:1 Mix 04/28/2013 NOT APPL  <42.9 secs Final  . Lupus Anticoagulant 04/28/2013 NOT DETECTED  NOT DETECTED Final   Performed at [facility]  . Beta-2 Glyco I IgG 04/28/2013 12  <20 G Units Final  . Beta-2-Glycoprotein I IgM 04/28/2013 6  <20 M Units Final  . Beta-2-Glycoprotein I IgA 04/28/2013 8  <20 A Units Final   Performed at [facility]  . Homocysteine 04/28/2013 5.1  4.0 - 15.4 umol/L Final   Performed at [facility]  . Recommendations-F5LEID: 04/28/2013 (NOTE)   Final   Comment: ** NEGATIVE FOR FACTOR V MUTATION **                          Reference Interval:                          Negative for Factor V Mutation                          Interpretation:                          Factor V Leiden Gene Mutation (G1691A) is the most common genetic risk                          factor for thrombosis and accounts for 94% of  individuals classified                          as Activated Protein C (APC) resistant.  Testing for Factor V Leiden                          Mutation is recommended for all individuals with venous thrombosis or                          history of a thrombic event.                          Both heterozygotes and homozygotes are at risk for thrombosis, which                          is 5-10 fold, and 50-100 fold respectively.                          DNA testing for the V R2 polymorphism is recommended for Factor V                          Leiden heterozygotes.  Presence of this polymorphism further increases                          the risk of venous thrombosis in Factor V Leiden heterozygotes.                          DNA from this patient was tested using a FDA approved assay for                           Factor V Leiden.                          Performed at [facility]  . Recommendations-PTGENE: 04/28/2013 (NOTE)   Final   Comment: **  NEGATIVE FOR PROTHROMBIN II GENE MUTATION  **                          Reference Interval:                          Negative for Prothrombin II Gene Mutation                          Interpretation:                          Prothrombin II 20210A Gene Mutation is a genetic risk factor resulting                          in an increase risk for venous thrombosis (five-fold); Myocardial                          infarction (two-fold);  Cerebrovascular ischemic disease in young  patients (especially females using oral contraceptives) and pulmonary                          embolism.                          Both heterozygous and homozygous carriers are at increased risk                          although homozygosity is rare.  The incidence of heterozygous carriers                          of this mutation is estimated to be 1 to 2.5% for individuals of                          European and African descent.                          DNA from this patient was tested using a FDA approved assay for                                 Factor II Prothrombin Mutation.                               Performed at [facility]  . Anticardiolipin IgG 04/28/2013 12  <23 GPL U/mL Final  . Anticardiolipin IgM 04/28/2013 1* <11 MPL U/mL Final  . Anticardiolipin IgA 04/28/2013 6* <22 APL U/mL Final   Comment: (NOTE)                          Reference Range:  Cardiolipin IgG                            Normal                  <23                            Low Positive (+)        23-35                            Moderate Positive (+)   36-50                            High Positive (+)       >50                          Reference Range:  Cardiolipin IgM                            Normal                  <11  Low Positive (+)        11-20                            Moderate Positive (+)   21-30                            High Positive (+)       >30                          Reference Range:  Cardiolipin IgA                            Normal                  <22                            Low Positive (+)        22-35                            Moderate Positive (+)   36-45                            High Positive (+)       >45                          Performed at [facility]  . Heparin Unfractionated 04/28/2013 0.70  0.30 - 0.70 IU/mL Final   Comment:                                  IF HEPARIN RESULTS ARE BELOW                          EXPECTED VALUES, AND PATIENT                          DOSAGE HAS BEEN CONFIRMED,                          SUGGEST FOLLOW UP TESTING                          OF ANTITHROMBIN III LEVELS.  . Glucose-Capillary 04/28/2013 74  70 - 99 mg/dL Final  . Glucose-Capillary 04/28/2013 78  70 - 99 mg/dL Final  . Glucose-Capillary 04/28/2013 105* 70 - 99 mg/dL Final  . Heparin Unfractionated 04/29/2013 0.38  0.30 - 0.70 IU/mL Final   Comment:                                 IF HEPARIN RESULTS ARE BELOW                          EXPECTED VALUES, AND PATIENT  DOSAGE HAS BEEN CONFIRMED,                          SUGGEST FOLLOW UP TESTING                          OF ANTITHROMBIN III LEVELS.  . WBC 04/29/2013 5.9  4.0 - 10.5 K/uL Final  . RBC 04/29/2013 3.66* 3.87 - 5.11 MIL/uL Final  . Hemoglobin 04/29/2013 9.8* 12.0 - 15.0 g/dL Final  . HCT 04/29/2013 30.2* 36.0 - 46.0 % Final  . MCV 04/29/2013 82.5  78.0 - 100.0 fL Final  . MCH 04/29/2013 26.8  26.0 - 34.0 pg Final  . MCHC 04/29/2013 32.5  30.0 - 36.0 g/dL Final  . RDW 04/29/2013 18.7* 11.5 - 15.5 % Final  . Platelets 04/29/2013 149* 150 - 400 K/uL Final  . Glucose-Capillary 04/28/2013 133* 70 - 99 mg/dL Final  . Comment 1 04/28/2013 Notify RN   Final  . Glucose-Capillary 04/29/2013 103* 70 - 99 mg/dL Final  . Glucose-Capillary 04/29/2013 77  70 - 99 mg/dL Final    Urinalysis    Component Value Date/Time   COLORURINE YELLOW 04/27/2013 [place] 04/27/2013 1639   LABSPEC <1.005* 04/27/2013 1639   PHURINE 5.5 04/27/2013 1639   GLUCOSEU NEGATIVE 04/27/2013 1639   HGBUR NEGATIVE 04/27/2013 1639   [place] 04/27/2013 1639   KETONESUR NEGATIVE 04/27/2013 1639   PROTEINUR NEGATIVE 04/27/2013 1639   UROBILINOGEN 0.2 04/27/2013 1639   NITRITE NEGATIVE 04/27/2013 1639   LEUKOCYTESUR NEGATIVE 04/27/2013 1639      @RADIOGRAPHY : Ir Ivc Filter Plmt / S&i /img  Guid/mod Sed  04/29/2013   CLINICAL DATA:  Sub massive acute saddle pulmonary emboli with right heart strain, DVT  EXAM: ULTRASOUND GUIDANCE FOR VASCULAR ACCESS  IVC CATHETERIZATION AND VENOGRAM  IVC FILTER INSERTION  Date:  4/9/20154/09/2013 9:57 AM  Radiologist:  Jerilynn Mages. Daryll Brod, MD  Guidance:  Ultrasound and fluoroscopic  FLUOROSCOPY TIME:  1 min 36 seconds minutes  MEDICATIONS AND MEDICAL HISTORY: 3 mg Versed, 100 mcg fentanyl  ANESTHESIA/SEDATION: 15 min  CONTRAST:  70m OMNIPAQUE IOHEXOL 300 MG/ML  SOLN  COMPLICATIONS: No immediate  PROCEDURE: Informed consent was obtained from the patient following explanation of the procedure, risks, benefits and alternatives. The patient understands, agrees and consents for the procedure. All questions were addressed. A time out was performed.  Maximal barrier sterile technique utilized including caps, mask, sterile gowns, sterile gloves, large sterile drape, hand hygiene, and betadine prep.  Under sterile condition and local anesthesia, right internal jugular venous access was performed with ultrasound. Over a guide wire, the IVC filter delivery sheath and inner dilator were advanced into the IVC just above the IVC bifurcation. Contrast injection was performed for an IVC venogram.  IVC VENOGRAM: The IVC is patent. No evidence of thrombus, stenosis, or occlusion. No variant venous anatomy. The renal veins are identified at L1.  IVC diameter measured below the renal veins is 27.8 mm.  IVC FILTER INSERTION: Through the delivery sheath, the Bard Denali IVC filter was deployed in the infrarenal IVC at the level just below the renal veins and above the IVC bifurcation. Contrast injection confirmed position. There is good apposition of the filter against the IVC.  The delivery sheath was removed and hemostasis was obtained with compression for 5 minutes. The patient tolerated the procedure well. No immediate complications.  IMPRESSION: Ultrasound and fluoroscopically  guided  infrarenal IVC filter insertion.   Electronically Signed   By: Daryll Brod M.D.   On: 04/29/2013 10:03    PATHOLOGY: None.   IMPRESSION:  #1. Saddle embolism with left lower extremity deep venous thrombosis in a morbidly obese individual with a family history of clotting and with a history of 2 miscarriages, highly suggestive of a congenital or acquired thrombophilia state although extensive workup thus far has been negative. She may have a clotting disorder which has not yet been recognized but clinical presentation is highly suggestive of potential for recurrence. #2. Morbid obesity. #3. Chronic blood loss with worsening bleeding during vaginal periods due to anticoagulant use.  #4. Hypertension, on treatment.   PLAN:  #1. Continue Xarelto 20 mg daily. #2. Discontinue smoking. #3. Use oxygen all night. #4. If ferritin is low, intravenous iron will be administered next week. #5. Followup in October 2015 with CT angiogram prior to her next visit to document resolution of embolism. CBC and d-dimer at the time of followup visit. #6. I believe this patient should probably stay on anticoagulation for the rest of her life. Should consider bariatric surgery.    I appreciate the opportunity of sharing in her care.  Farrel Gobble, MD 05/28/2013 5:05 PM   DISCLAIMER:  This note was dictated with voice recognition softwre.  Similar sounding words can inadvertently be transcribed inaccurately and may not be corrected upon review.

## 2013-05-28 NOTE — Progress Notes (Signed)
[name] [name] presented for labwork. Labs per MD order drawn via Peripheral Line 23 gauge needle inserted in left antecubital.  Good blood return present. Procedure without incident.  Needle removed intact. Patient tolerated procedure well.

## 2013-05-28 NOTE — Patient Instructions (Signed)
[facility] Discharge Instructions  RECOMMENDATIONS MADE BY THE CONSULTANT AND ANY TEST RESULTS WILL BE SENT TO YOUR REFERRING PHYSICIAN.  EXAM FINDINGS BY THE PHYSICIAN TODAY AND SIGNS OR SYMPTOMS TO REPORT TO CLINIC OR PRIMARY PHYSICIAN: Exam and findings as discussed by Dr. Barnet Glasgow.  MEDICATIONS PRESCRIBED:   Oxycodone (take as instructed for pain).  This medication may cause drowsiness; do not drive within 4 hours of taking.  INSTRUCTIONS/FOLLOW-UP:  We will check your ferritin level today (results should be back Monday or Tuesday of next week).  If needed, we will call you to set up an appointment for an iron infusion.  Please start using your oxygen at bedtime and sleep with it on; continue to use while up and walking around.  We will see you back in the clinic for a follow up visit with the doctor in October - a CT scan of your chest will be done prior to that visit to check if your clot has dissolved.   Thank you for choosing [facility] to provide your oncology and hematology care.  To afford each patient quality time with our providers, please arrive at least 15 minutes before your scheduled appointment time.  With your help, our goal is to use those 15 minutes to complete the necessary work-up to ensure our physicians have the information they need to help with your evaluation and healthcare recommendations.    Effective January 1st, 2014, we ask that you re-schedule your appointment with our physicians should you arrive 10 or more minutes late for your appointment.  We strive to give you quality time with our providers, and arriving late affects you and other patients whose appointments are after yours.    Again, thank you for choosing [facility].  Our hope is that these requests will decrease the amount of time that you wait before being seen by our physicians.        _____________________________________________________________  Should you have questions after your visit to [facility], please contact our office at [phone] between the hours of 8:30 a.m. and 5:00 p.m.  Voicemails left after 4:30 p.m. will not be returned until the following business day.  For prescription refill requests, have your pharmacy contact our office with your prescription refill request.

## 2013-05-29 LAB — HOMOCYSTEINE: Homocysteine: 8.1 umol/L (ref 4.0–15.4)

## 2013-06-02 LAB — FACTOR 5 LEIDEN

## 2013-06-02 LAB — PROTHROMBIN GENE MUTATION

## 2013-06-22 ENCOUNTER — Other Ambulatory Visit (HOSPITAL_COMMUNITY): Payer: Self-pay | Admitting: *Deleted

## 2013-06-22 ENCOUNTER — Other Ambulatory Visit (HOSPITAL_COMMUNITY): Payer: Self-pay | Admitting: Family Medicine

## 2013-06-22 ENCOUNTER — Ambulatory Visit (HOSPITAL_COMMUNITY)
Admission: RE | Admit: 2013-06-22 | Discharge: 2013-06-22 | Disposition: A | Discharge status: Home / Self Care | Payer: Self-pay | Source: Ambulatory Visit | Attending: Family Medicine | Admitting: Family Medicine

## 2013-06-22 DIAGNOSIS — I82890 Acute embolism and thrombosis of other specified veins: Secondary | ICD-10-CM | POA: Insufficient documentation

## 2013-06-22 DIAGNOSIS — M79609 Pain in unspecified limb: Secondary | ICD-10-CM

## 2013-06-22 DIAGNOSIS — I82409 Acute embolism and thrombosis of unspecified deep veins of unspecified lower extremity: Secondary | ICD-10-CM

## 2013-07-06 ENCOUNTER — Ambulatory Visit (INDEPENDENT_AMBULATORY_CARE_PROVIDER_SITE_OTHER): Payer: Self-pay | Admitting: Emergency Medicine

## 2013-07-06 ENCOUNTER — Encounter: Payer: Self-pay | Admitting: Emergency Medicine

## 2013-07-06 VITALS — HR 101 | Ht 70.0 in | Wt 360.0 lb

## 2013-07-06 DIAGNOSIS — F17200 Nicotine dependence, unspecified, uncomplicated: Secondary | ICD-10-CM

## 2013-07-06 DIAGNOSIS — I2699 Other pulmonary embolism without acute cor pulmonale: Secondary | ICD-10-CM

## 2013-07-06 DIAGNOSIS — G4733 Obstructive sleep apnea (adult) (pediatric): Secondary | ICD-10-CM | POA: Insufficient documentation

## 2013-07-06 DIAGNOSIS — I2789 Other specified pulmonary heart diseases: Secondary | ICD-10-CM

## 2013-07-06 DIAGNOSIS — Z9989 Dependence on other enabling machines and devices: Secondary | ICD-10-CM

## 2013-07-06 DIAGNOSIS — IMO0002 Reserved for concepts with insufficient information to code with codable children: Secondary | ICD-10-CM | POA: Insufficient documentation

## 2013-07-06 NOTE — Assessment & Plan Note (Addendum)
She is working on cessation. I will defer PFT for now. She will need these at some point in the future.

## 2013-07-06 NOTE — Assessment & Plan Note (Signed)
Clearly due to her acute PE, but suspect that she also has OSA. I will defer her repeat TTE for now, would likel to assess for OSA and optimally get her started on CPAP before reassessing RV fxn.

## 2013-07-06 NOTE — Progress Notes (Signed)
Subjective:    Patient ID: [name] [name], female    DOB: 05/12/1975, 38 y.o.   MRN: [id number]  HPI 38 yo smoker, HTN, obesity, recent admission for PE with associated PAH and RV dilation. She is being treated with xarelto.  She has a hx of dyspnea even preceding the PE. She is a bit better, but still some with exertion.  She snores, has some witnessed apneas and jerks herself awake. She has L>R LE edema. No cough or wheeze.    Review of Systems  Constitutional: Negative for fever and unexpected weight change.  HENT: Negative for congestion, dental problem, ear pain, nosebleeds, postnasal drip, rhinorrhea, sinus pressure, sneezing, sore throat and trouble swallowing.   Eyes: Negative for redness and itching.  Respiratory: Positive for shortness of breath. Negative for cough, chest tightness and wheezing.   Cardiovascular: Positive for chest pain and leg swelling. Negative for palpitations.       Hand and feet  Gastrointestinal: Positive for abdominal distention. Negative for nausea and vomiting.  Genitourinary: Negative for dysuria.  Musculoskeletal: Positive for joint swelling.  Skin: Negative for rash.  Neurological: Positive for headaches.  Hematological: Does not bruise/bleed easily.  Psychiatric/Behavioral: Negative for dysphoric mood. The patient is not nervous/anxious.    Past Medical History  Diagnosis Date  . Closed left ankle fracture     August 30 2012  . Obesity   . Tobacco use disorder   . High blood pressure      Family History  Problem Relation Age of Onset  . Hypertension Mother   . Diabetes Paternal Aunt   . Diabetes Paternal Uncle      History   Social History  . Marital Status: Legally Separated    Spouse Name: N/A    Number of Children: N/A  . Years of Education: N/A   Occupational History  . Not on file.   Social History Main Topics  . Smoking status: Current Every Day Smoker -- 0.25 packs/day for 18 years    Types: Cigarettes  . Smokeless  tobacco: Not on file     Comment: 1 cig per day  . Alcohol Use: Yes  . Drug Use: No  . Sexual Activity: Yes    Birth Control/ Protection: None   Other Topics Concern  . Not on file   Social History Narrative  . No narrative on file     Allergies  Allergen Reactions  . Sulfa Antibiotics Swelling    Lips swelling, rash, itching     Outpatient Prescriptions Prior to Visit  Medication Sig Dispense Refill  . oxyCODONE (OXY IR/ROXICODONE) 5 MG immediate release tablet Take 1 tablet (5 mg total) by mouth every 4 (four) hours as needed for severe pain.  100 tablet  0  . Rivaroxaban (XARELTO) 20 MG TABS tablet Take 1 tablet (20 mg total) by mouth daily with supper.  30 tablet  12  . lisinopril (PRINIVIL,ZESTRIL) 10 MG tablet Take 10 mg by mouth daily.       No facility-administered medications prior to visit.         Objective:   Physical Exam Filed Vitals:   07/06/13 1613  Pulse: 101  Height: 5\' 10"  (1.778 m)  Weight: 360 lb (163.295 kg)  SpO2: 99%   Gen: Pleasant, obese woman, in no distress,  normal affect  ENT: No lesions,  mouth clear,  oropharynx clear, no postnasal drip  Neck: No JVD, no TMG, no carotid bruits  Lungs: No use  of accessory muscles, clear without rales or rhonchi  Cardiovascular: RRR, heart sounds normal, no murmur or gallops, no peripheral edema  Musculoskeletal: No deformities, no cyanosis or clubbing, no calf tenderness  Neuro: alert, non focal  Skin: Warm, no lesions or rashes    04/27/13 --  COMPARISON: None.  FINDINGS:  A large saddle embolus is appreciated. Extending from the distal  main pulmonary artery and to the left and right pulmonary arteries  with extension into the left upper lobe, left lower lobe and lingula  branches. There is right upper lobe, right middle lobe and right  lower lobe extension. Areas of subsegmental extension appreciated.  RV LV ratio 1.65. There are areas of intraventricular septal bowing  to the right.    The thoracic inlet is unremarkable.  There is no evidence of mediastinal masses or adenopathy.  The lungs are clear.  Visualized upper abdominal viscera are unremarkable.  Review of the MIP images confirms the above findings.  IMPRESSION:  Positive for acute PE with CTevidence of right heart strain (RV/LV  Ratio = 1.65 ) consistent with at least submassive (intermediate  risk) PE. The presence of right heart strain has been associated  with an increased risk of morbidity and mortality.    04/28/13 --  Study Conclusions - Study data: Technically difficult study. - Procedure narrative: Transthoracic echocardiography. Image quality was adequate. The study was technically difficult, as a result of body habitus. - Left ventricle: The cavity size was normal. Wall thickness was increased in a pattern of moderate LVH. Systolic function was vigorous. The estimated ejection fraction was in the range of 65% to 70%. Wall motion was normal; there were no regional wall motion abnormalities. Doppler parameters are consistent with abnormal left ventricular relaxation (grade 1 diastolic dysfunction). - Aortic valve: Valve area: 2.01cm^2(VTI). Valve area: 2.11cm^2 (Vmax). - Right ventricle: There is systolic flattening of the interventricular septum consistent with RV pressure overload. The cavity size was severely dilated. Systolic function was mildly reduced. - Pulmonary arteries: Systolic pressure was moderately increased. PA peak pressure: 43mm Hg (S).      Assessment & Plan:  Tobacco use disorder She is working on cessation. I will defer PFT for now. She will need these at some point in the future.   PE (pulmonary embolism) Being treated with xarelto. At this point we should be able to d/c her IVCF. This will need to be done in [place] in IR. Will arrange for this.   Secondary pulmonary hypertension Clearly due to her acute PE, but suspect that she also has OSA. I will defer her repeat TTE  for now, would likel to assess for OSA and optimally get her started on CPAP before reassessing RV fxn.   Obstructive sleep apnea Will perform a PSG at [facility], then follow here

## 2013-07-06 NOTE — Patient Instructions (Signed)
We will set up an appointment to have your vena caval filter removed at [facility] Please continue your Xarelto We will perform a Sleep Study at [facility] We will wait on repeating your echocardiogram for now, but will need to do so in the future.  Follow with Dr Lamonte Sakai in 2 months or sooner if you have any problems.

## 2013-07-06 NOTE — Assessment & Plan Note (Signed)
Will perform a PSG at [facility], then follow here

## 2013-07-06 NOTE — Assessment & Plan Note (Signed)
Being treated with xarelto. At this point we should be able to d/c her IVCF. This will need to be done in [place] in IR. Will arrange for this.

## 2013-07-07 ENCOUNTER — Other Ambulatory Visit: Payer: Self-pay | Admitting: Radiology

## 2013-07-07 ENCOUNTER — Encounter (HOSPITAL_COMMUNITY): Payer: Self-pay | Admitting: Pharmacy Technician

## 2013-07-09 ENCOUNTER — Other Ambulatory Visit: Payer: Self-pay | Admitting: Radiology

## 2013-07-12 ENCOUNTER — Ambulatory Visit (HOSPITAL_COMMUNITY)
Admission: RE | Admit: 2013-07-12 | Discharge: 2013-07-12 | Disposition: A | Discharge status: Home / Self Care | Payer: Self-pay | Source: Ambulatory Visit | Attending: Emergency Medicine | Admitting: Emergency Medicine

## 2013-07-12 ENCOUNTER — Other Ambulatory Visit: Payer: Self-pay | Admitting: Emergency Medicine

## 2013-07-12 ENCOUNTER — Encounter (HOSPITAL_COMMUNITY): Payer: Self-pay

## 2013-07-12 DIAGNOSIS — I10 Essential (primary) hypertension: Secondary | ICD-10-CM | POA: Insufficient documentation

## 2013-07-12 DIAGNOSIS — E669 Obesity, unspecified: Secondary | ICD-10-CM | POA: Insufficient documentation

## 2013-07-12 DIAGNOSIS — Z86711 Personal history of pulmonary embolism: Secondary | ICD-10-CM | POA: Insufficient documentation

## 2013-07-12 DIAGNOSIS — I82891 Chronic embolism and thrombosis of other specified veins: Secondary | ICD-10-CM | POA: Insufficient documentation

## 2013-07-12 DIAGNOSIS — I2699 Other pulmonary embolism without acute cor pulmonale: Secondary | ICD-10-CM

## 2013-07-12 DIAGNOSIS — I82220 Acute embolism and thrombosis of inferior vena cava: Secondary | ICD-10-CM | POA: Insufficient documentation

## 2013-07-12 DIAGNOSIS — F17200 Nicotine dependence, unspecified, uncomplicated: Secondary | ICD-10-CM | POA: Insufficient documentation

## 2013-07-12 DIAGNOSIS — Z7901 Long term (current) use of anticoagulants: Secondary | ICD-10-CM | POA: Insufficient documentation

## 2013-07-12 HISTORY — DX: Other pulmonary embolism without acute cor pulmonale: I26.99

## 2013-07-12 LAB — BASIC METABOLIC PANEL
BUN: 16 mg/dL (ref 6–23)
CALCIUM: 8.9 mg/dL (ref 8.4–10.5)
CO2: 23 mEq/L (ref 19–32)
CREATININE: 0.86 mg/dL (ref 0.50–1.10)
Chloride: 104 mEq/L (ref 96–112)
GFR calc Af Amer: >90 mL/min (ref >90)
GFR, EST NON AFRICAN AMERICAN: 86 mL/min — AB (ref >90)
GLUCOSE: 89 mg/dL (ref 70–99)
Potassium: 3.8 mEq/L (ref 3.7–5.3)
Sodium: 139 mEq/L (ref 137–147)

## 2013-07-12 LAB — CBC
HCT: 30.6 % — ABNORMAL LOW (ref 36.0–46.0)
Hemoglobin: 9.1 g/dL — ABNORMAL LOW (ref 12.0–15.0)
MCH: 22.2 pg — AB (ref 26.0–34.0)
MCHC: 29.7 g/dL — AB (ref 30.0–36.0)
MCV: 74.6 fL — ABNORMAL LOW (ref 78.0–100.0)
Platelets: 236 10*3/uL (ref 150–400)
RBC: 4.1 MIL/uL (ref 3.87–5.11)
RDW: 18.1 % — AB (ref 11.5–15.5)
WBC: 4.2 10*3/uL (ref 4.0–10.5)

## 2013-07-12 LAB — APTT: aPTT: 26 seconds (ref 24–37)

## 2013-07-12 LAB — PROTIME-INR
INR: 1.21 (ref 0.00–1.49)
Prothrombin Time: 15 seconds (ref 11.6–15.2)

## 2013-07-12 MED ORDER — MIDAZOLAM HCL 2 MG/2ML IJ SOLN
INTRAMUSCULAR | Status: AC
  Filled 2013-07-12: qty 4

## 2013-07-12 MED ORDER — IOHEXOL 300 MG/ML  SOLN
20.0000 mL | Freq: Once | INTRAMUSCULAR | Status: AC | PRN
  Administered 2013-07-12: 40 mL

## 2013-07-12 MED ORDER — FENTANYL CITRATE 0.05 MG/ML IJ SOLN
INTRAMUSCULAR | Status: AC | PRN
  Administered 2013-07-12 (×2): 100 ug via INTRAVENOUS

## 2013-07-12 MED ORDER — SODIUM CHLORIDE 0.9 % IV SOLN
INTRAVENOUS | Status: DC
  Administered 2013-07-12: 08:00:00 via INTRAVENOUS

## 2013-07-12 MED ORDER — FENTANYL CITRATE 0.05 MG/ML IJ SOLN
INTRAMUSCULAR | Status: AC
  Filled 2013-07-12: qty 4

## 2013-07-12 MED ORDER — LIDOCAINE HCL 1 % IJ SOLN
INTRAMUSCULAR | Status: AC
  Filled 2013-07-12: qty 20

## 2013-07-12 MED ORDER — MIDAZOLAM HCL 2 MG/2ML IJ SOLN
INTRAMUSCULAR | Status: AC | PRN
  Administered 2013-07-12 (×2): 2 mg via INTRAVENOUS

## 2013-07-12 NOTE — H&P (Signed)
[name] [name] is an 38 y.o. female.   Chief Complaint: Pt was seen at [facility] 04/27/2013 with sudden onset shortness of breath and chest pain. CTA revealed saddle PE with Rt heart strain and placed on anticoagulation. Was determined not a candidate for thrombolysis secondary to her blood pressure issues. Retrievable inferior vena cava filter placed 04/29/13 after doppler + LLE DVT. Has done well as she transitioned to Xarelto and continues now--probable lifetime anticoagulation. Repeat doppler 06/22/13 L extremity shows chronic focal popliteal thrombus.  No evidence acute or superimposed DVT. Now scheduled for IVC filter removal.  HPI: ankle fx 2014; obese; smoker; HTN; saddle PE  Past Medical History  Diagnosis Date  . Closed left ankle fracture     August 30 2012  . Obesity   . Tobacco use disorder   . High blood pressure   . Pulmonary embolism 04/2013    History reviewed. No pertinent past surgical history.  Family History  Problem Relation Age of Onset  . Hypertension Mother   . Diabetes Paternal Aunt   . Diabetes Paternal Uncle    Social History:  reports that she has been smoking Cigarettes.  She has a 4.5 pack-year smoking history. She does not have any smokeless tobacco history on file. She reports that she drinks alcohol. She reports that she does not use illicit drugs.  Allergies:  Allergies  Allergen Reactions  . Sulfa Antibiotics Swelling    Lips swelling, rash, itching     (Not in a hospital admission)  Results for orders placed during the hospital encounter of 07/12/13 (from the past 48 hour(s))  APTT     Status: None   Collection Time    07/12/13  7:50 AM      Result Value Ref Range   aPTT 26  24 - 37 seconds  BASIC METABOLIC PANEL     Status: Abnormal   Collection Time    07/12/13  7:50 AM      Result Value Ref Range   Sodium 139  137 - 147 mEq/L   Potassium 3.8  3.7 - 5.3 mEq/L   Chloride 104  96 - 112 mEq/L   CO2 23  19 - 32 mEq/L   Glucose, Bld 89  70  - 99 mg/dL   BUN 16  6 - 23 mg/dL   Creatinine, Ser 0.86  0.50 - 1.10 mg/dL   Calcium 8.9  8.4 - 10.5 mg/dL   GFR calc non Af Amer 86 (*) >90 mL/min   GFR calc Af Amer >90  >90 mL/min   Comment: (NOTE)     The eGFR has been calculated using the CKD EPI equation.     This calculation has not been validated in all clinical situations.     eGFR's persistently <90 mL/min signify possible Chronic Kidney     Disease.  CBC     Status: Abnormal   Collection Time    07/12/13  7:50 AM      Result Value Ref Range   WBC 4.2  4.0 - 10.5 K/uL   RBC 4.10  3.87 - 5.11 MIL/uL   Hemoglobin 9.1 (*) 12.0 - 15.0 g/dL   HCT 30.6 (*) 36.0 - 46.0 %   MCV 74.6 (*) 78.0 - 100.0 fL   MCH 22.2 (*) 26.0 - 34.0 pg   MCHC 29.7 (*) 30.0 - 36.0 g/dL   RDW 18.1 (*) 11.5 - 15.5 %   Platelets 236  150 - 400 K/uL  PROTIME-INR  Status: None   Collection Time    07/12/13  7:50 AM      Result Value Ref Range   Prothrombin Time 15.0  11.6 - 15.2 seconds   INR 1.21  0.00 - 1.49   No results found.  Review of Systems  Constitutional: Negative for fever and weight loss.  Respiratory: Negative for shortness of breath.   Cardiovascular: Negative for chest pain.  Gastrointestinal: Negative for nausea and vomiting.  Neurological: Negative for dizziness, weakness and headaches.  Psychiatric/Behavioral: Positive for substance abuse.       Smoker    Blood pressure 155/96, pulse 80, temperature 98.4 F (36.9 C), temperature source Oral, resp. rate 18, last menstrual period 06/15/2013, SpO2 100.00%. Physical Exam  Constitutional: She is oriented to person, place, and time. She appears well-nourished.  obese  Cardiovascular: Normal rate, regular rhythm and normal heart sounds.   No murmur heard. Respiratory: Effort normal and breath sounds normal. She has no wheezes.  GI: Soft. Bowel sounds are normal. There is no tenderness.  Musculoskeletal: Normal range of motion.  Neurological: She is alert and oriented to  person, place, and time.  Skin: Skin is warm and dry.  Psychiatric: She has a normal mood and affect. Her behavior is normal. Judgment and thought content normal.     Assessment/Plan Previous saddle PE  with +LLE DVT 04/27/2013 Not candidate for PE lysis IVC filter placed 04/29/2013 Placed immediately on Heparin and transitioned to Xarelto Will be on this for lifetime probable. 6/2 doppler shows no acute or superimposed DVT Pt now scheduled for IVC filter removal. Pt and family aware of procedure benefits and risks and agreeable to proceed Consent signed and in chart   TURPIN,PAMELA A 07/12/2013, 8:30 AM

## 2013-07-12 NOTE — Procedures (Signed)
IVC VENOGRAM SHOWS large IVC clot trapped by the filter Therefor filter not removed and will remain permanent  No comp Stable Full report in pacs

## 2013-07-12 NOTE — Discharge Instructions (Signed)
Conscious Sedation, Adult, Care After Refer to this sheet in the next few weeks. These instructions provide you with information on caring for yourself after your procedure. Your health care provider may also give you more specific instructions. Your treatment has been planned according to current medical practices, but problems sometimes occur. Call your health care provider if you have any problems or questions after your procedure. WHAT TO EXPECT AFTER THE PROCEDURE  After your procedure:  You may feel sleepy, clumsy, and have poor balance for several hours.  Vomiting may occur if you eat too soon after the procedure. HOME CARE INSTRUCTIONS  Do not participate in any activities where you could become injured for at least 24 hours. Do not:  Drive.  Swim.  Ride a bicycle.  Operate heavy machinery.  Cook.  Use power tools.  Climb ladders.  Work from a high place.  Do not make important decisions or sign legal documents until you are improved.  If you vomit, drink water, juice, or soup when you can drink without vomiting. Make sure you have little or no nausea before eating solid foods.  Only take over-the-counter or prescription medicines for pain, discomfort, or fever as directed by your health care provider.  Make sure you and your family fully understand everything about the medicines given to you, including what side effects may occur.  You should not drink alcohol, take sleeping pills, or take medicines that cause drowsiness for at least 24 hours.  If you smoke, do not smoke without supervision.  If you are feeling better, you may resume normal activities 24 hours after you were sedated.  Keep all appointments with your health care provider. SEEK MEDICAL CARE IF:  Your skin is pale or bluish in color.  You continue to feel nauseous or vomit.  Your pain is getting worse and is not helped by medicine.  You have bleeding or swelling.  You are still sleepy or  feeling clumsy after 24 hours. SEEK IMMEDIATE MEDICAL CARE IF:  You develop a rash.  You have difficulty breathing.  You develop any type of allergic problem.  You have a fever. MAKE SURE YOU:  Understand these instructions.  Will watch your condition.  Will get help right away if you are not doing well or get worse. Document Released: 10/28/2012 Document Reviewed: 10/28/2012 [facility] Patient Information 2015 [place], [place]. This information is not intended to replace advice given to you by your health care provider. Make sure you discuss any questions you have with your health care provider.  Inferior Vena Cava Filter, Care After Refer to this sheet in the next few weeks. These instructions provide you with information on caring for yourself after your procedure. Your health care provider may also give you more specific instructions. Your treatment has been planned according to current medical practices, but problems sometimes occur. Call your health care provider if you have any problems or questions after your procedure. WHAT TO EXPECT AFTER THE PROCEDURE After your procedure, it is typical to have the following:  Mild pain in the area where the filter was inserted/checked.  Mild bruising in the area where the filter was inserted/checked. HOME CARE INSTRUCTIONS  A bandage (dressing) has been placed over the insertion/procedure site. Follow your health care provider's instructions on how to care for it.  Keep the insertion/procedure site clean and dry.  Do not soak in a bath tub or pool until the filter insertion/procedure site has healed.  Keep all follow-up appointments.  SEEK IMMEDIATE MEDICAL CARE IF:  You develop swelling and discoloration or pain in the legs.  Your legs become pale and cold or blue.  You develop shortness of breath, feel faint, or pass out.  You develop chest pain, a cough, or difficulty breathing.  You cough up blood.  You develop a rash  or feel you are having problems that may be a side effect of medicines.  You develop weakness, difficulty moving your arms or legs, or balance problems.  You develop problems with speech or vision. Document Released: 10/28/2012 Document Reviewed: 10/28/2012 [facility] Patient Information 2015 [place], [place]. This information is not intended to replace advice given to you by your health care provider. Make sure you discuss any questions you have with your health care provider.

## 2013-07-15 ENCOUNTER — Ambulatory Visit: Payer: Self-pay | Admitting: Neurology

## 2013-07-15 NOTE — Sleep Study (Signed)
Sleep study  could not be performed - no written/verbal in chart and  order and no order in EPIC J. Beryl Meager

## 2013-07-25 ENCOUNTER — Ambulatory Visit: Payer: Self-pay | Attending: Family Medicine | Admitting: Sleep Medicine

## 2013-07-25 VITALS — Ht 70.0 in | Wt 360.0 lb

## 2013-07-25 DIAGNOSIS — G4733 Obstructive sleep apnea (adult) (pediatric): Secondary | ICD-10-CM | POA: Insufficient documentation

## 2013-07-26 ENCOUNTER — Telehealth: Payer: Self-pay | Admitting: Emergency Medicine

## 2013-07-26 NOTE — Telephone Encounter (Signed)
Spoke with patient-- Pt aware to contact initial prescriber for refills of Oxycodone as we have never prescribed this for her.  Disability forms -- denied d/t statement within paperwork stating that patient can work a "limited occupation" Pt states that she needs a letter to give to Disability office stating what she is released to work -- what are her limitations? Pt aware that Dr Lamonte Sakai not in office until 07/27/13  Requests phone call once letter complete.   Will send to Dr Lamonte Sakai to address and Meghan to follow up on. Thanks.

## 2013-07-27 NOTE — Telephone Encounter (Signed)
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 I would like for her to only work half-days until I can see her back and reassess. Thanks.

## 2013-07-28 ENCOUNTER — Encounter: Payer: Self-pay | Admitting: *Deleted

## 2013-07-28 NOTE — Telephone Encounter (Signed)
[email or web address] x1 

## 2013-07-28 NOTE — Telephone Encounter (Signed)
[place]

## 2013-07-28 NOTE — Telephone Encounter (Signed)
This letter has been written per RB and he has signed. Left message for pt that this was done and what she wanted me to do with this letter Awaiting return call from pt

## 2013-07-29 NOTE — Telephone Encounter (Signed)
Called and spoke with pt and she stated that she will stop by the office tomorrow to pick up this letter.  i will forward this message to meghan to make her aware.

## 2013-07-30 ENCOUNTER — Telehealth (HOSPITAL_COMMUNITY): Payer: Self-pay | Admitting: *Deleted

## 2013-08-02 ENCOUNTER — Other Ambulatory Visit (HOSPITAL_COMMUNITY): Payer: Self-pay | Admitting: Oncology

## 2013-08-02 DIAGNOSIS — I2699 Other pulmonary embolism without acute cor pulmonale: Secondary | ICD-10-CM

## 2013-08-02 DIAGNOSIS — I2602 Saddle embolus of pulmonary artery with acute cor pulmonale: Secondary | ICD-10-CM

## 2013-08-02 DIAGNOSIS — R0781 Pleurodynia: Secondary | ICD-10-CM

## 2013-08-02 MED ORDER — OXYCODONE HCL 5 MG PO TABS: 5.0000 mg | ORAL_TABLET | ORAL | Status: DC | PRN

## 2013-08-03 NOTE — Sleep Study (Signed)
  [place] A. Merlene Laughter, MD     [email or web address]        NOCTURNAL POLYSOMNOGRAM    LOCATION: SLEEP LAB FACILITY: [facility]   PHYSICIAN: Marna Weniger A. Merlene Laughter, M.D.   DATE OF STUDY: 07/25/2013.   REFERRING PHYSICIAN: Yevette Edwards, NP.  INDICATIONS: this is a 38 year old female who complains of fatigue, snoring and hypersomnia.  MEDICATIONS:  Prior to Admission medications   Medication Sig Start Date End Date Taking? Authorizing Provider  amLODipine (NORVASC) 2.5 MG tablet Take 2.5 mg by mouth every morning.     Historical Provider, MD  lisinopril (PRINIVIL,ZESTRIL) 20 MG tablet Take 20 mg by mouth every morning.     Historical Provider, MD  oxyCODONE (OXY IR/ROXICODONE) 5 MG immediate release tablet Take 1 tablet (5 mg total) by mouth every 4 (four) hours as needed for severe pain. 08/02/13   Baird Cancer, PA-C  Rivaroxaban (XARELTO) 20 MG TABS tablet Take 1 tablet (20 mg total) by mouth daily with supper. 05/20/13   Janece Canterbury, MD      EPWORTH SLEEPINESS SCALE: 7.   BMI: 52.   ARCHITECTURAL SUMMARY: Total recording time was 420 minutes. Sleep efficiency 77 %. Sleep latency 46 minutes. REM latency 92 minutes. Stage NI 8 %, N2 65 % and N3 8 % and REM sleep 18 %.    RESPIRATORY DATA:  Baseline oxygen saturation is 98 %. The lowest saturation is 82 %. The diagnostic AHI is 37. This was benign study as the patient met criteria. She was placed in positive pressure starting at 5 and increase to 9. Doppler pressure is 9 with resolution of obstructive events.  LIMB MOVEMENT SUMMARY: PLM index 0.   ELECTROCARDIOGRAM SUMMARY: Average heart rate is 71 with no significant dysrhythmias observed.   IMPRESSION:  1. Moderate to severe obstructive sleep apnea syndrome which responds well to his CPAP of 9.  Thanks for this referral.  Jyra Lagares A. Merlene Laughter, M.D. Diplomat, Tax adviser of Sleep Medicine.

## 2013-09-10 ENCOUNTER — Ambulatory Visit (INDEPENDENT_AMBULATORY_CARE_PROVIDER_SITE_OTHER): Payer: Self-pay | Admitting: Emergency Medicine

## 2013-09-10 ENCOUNTER — Encounter: Payer: Self-pay | Admitting: Emergency Medicine

## 2013-09-10 VITALS — BP 136/76 | HR 78 | Ht 70.0 in | Wt 360.0 lb

## 2013-09-10 DIAGNOSIS — I2699 Other pulmonary embolism without acute cor pulmonale: Secondary | ICD-10-CM

## 2013-09-10 DIAGNOSIS — G4733 Obstructive sleep apnea (adult) (pediatric): Secondary | ICD-10-CM

## 2013-09-10 NOTE — Assessment & Plan Note (Signed)
Her IVCF has been left in due to large clot on the filter. She will need to leave it in, suspect we will need to consider life-long anticoag.  - xarelto - walking oximetry today to see if we can d/c O2 - rov 3

## 2013-09-10 NOTE — Patient Instructions (Signed)
Walking oximetry today on RA We will set up CPAP at [facility] through [facility] your xarelto as you have been taking it Follow with Dr Lamonte Sakai in 3 months or sooner if you have any problems.

## 2013-09-10 NOTE — Assessment & Plan Note (Signed)
-   order CPAP today

## 2013-09-10 NOTE — Progress Notes (Signed)
Subjective:    Patient ID: [name] [name], female    DOB: [birth year], 38 y.o.   MRN: [id number]  HPI 38 yo smoker, HTN, obesity, recent admission for PE with associated PAH and RV dilation. She is being treated with xarelto.  She has a hx of dyspnea even preceding the PE. She is a bit better, but still some with exertion.  She snores, has some witnessed apneas and jerks herself awake. She has L>R LE edema. No cough or wheeze.   ROV 09/10/13 -- 38 yo woman, hx PE in 04/2013, OSA, secondary PAH. PSG > shows moderate OSA that responded to CPAP 9cm H2O. She went to have IVCF extracted but there was clot in the filter so they left it in. It will likely be permanent. She has needed O2 with exertion, breathing has been stable.    Review of Systems  Constitutional: Negative for fever and unexpected weight change.  HENT: Negative for congestion, dental problem, ear pain, nosebleeds, postnasal drip, rhinorrhea, sinus pressure, sneezing, sore throat and trouble swallowing.   Eyes: Negative for redness and itching.  Respiratory: Positive for shortness of breath. Negative for cough, chest tightness and wheezing.   Cardiovascular: Positive for chest pain and leg swelling. Negative for palpitations.       Hand and feet  Gastrointestinal: Positive for abdominal distention. Negative for nausea and vomiting.  Genitourinary: Negative for dysuria.  Musculoskeletal: Positive for joint swelling.  Skin: Negative for rash.  Neurological: Positive for headaches.  Hematological: Does not bruise/bleed easily.  Psychiatric/Behavioral: Negative for dysphoric mood. The patient is not nervous/anxious.       Objective:   Physical Exam Filed Vitals:   09/10/13 0958  BP: 136/76  Pulse: 78  Height: 5\' 10"  (1.778 m)  Weight: 360 lb (163.295 kg)  SpO2: 100%   Gen: Pleasant, obese woman, in no distress,  normal affect  ENT: No lesions,  mouth clear,  oropharynx clear, no postnasal drip  Neck: No JVD, no TMG, no  carotid bruits  Lungs: No use of accessory muscles, clear without rales or rhonchi  Cardiovascular: RRR, heart sounds normal, no murmur or gallops, no peripheral edema  Musculoskeletal: No deformities, no cyanosis or clubbing, no calf tenderness  Neuro: alert, non focal  Skin: Warm, no lesions or rashes    04/27/13 --  COMPARISON: None.  FINDINGS:  A large saddle embolus is appreciated. Extending from the distal  main pulmonary artery and to the left and right pulmonary arteries  with extension into the left upper lobe, left lower lobe and lingula  branches. There is right upper lobe, right middle lobe and right  lower lobe extension. Areas of subsegmental extension appreciated.  RV LV ratio 1.65. There are areas of intraventricular septal bowing  to the right.  The thoracic inlet is unremarkable.  There is no evidence of mediastinal masses or adenopathy.  The lungs are clear.  Visualized upper abdominal viscera are unremarkable.  Review of the MIP images confirms the above findings.  IMPRESSION:  Positive for acute PE with CTevidence of right heart strain (RV/LV  Ratio = 1.65 ) consistent with at least submassive (intermediate  risk) PE. The presence of right heart strain has been associated  with an increased risk of morbidity and mortality.    04/28/13 --  Study Conclusions - Study data: Technically difficult study. - Procedure narrative: Transthoracic echocardiography. Image quality was adequate. The study was technically difficult, as a result of body habitus. - Left ventricle: The  cavity size was normal. Wall thickness was increased in a pattern of moderate LVH. Systolic function was vigorous. The estimated ejection fraction was in the range of 65% to 70%. Wall motion was normal; there were no regional wall motion abnormalities. Doppler parameters are consistent with abnormal left ventricular relaxation (grade 1 diastolic dysfunction). - Aortic valve: Valve area:  2.01cm^2(VTI). Valve area: 2.11cm^2 (Vmax). - Right ventricle: There is systolic flattening of the interventricular septum consistent with RV pressure overload. The cavity size was severely dilated. Systolic function was mildly reduced. - Pulmonary arteries: Systolic pressure was moderately increased. PA peak pressure: 58mm Hg (S).      Assessment & Plan:  PE (pulmonary embolism) Her IVCF has been left in due to large clot on the filter. She will need to leave it in, suspect we will need to consider life-long anticoag.  - xarelto - walking oximetry today to see if we can d/c O2 - rov 3  Obstructive sleep apnea - order CPAP today

## 2013-09-16 ENCOUNTER — Telehealth: Payer: Self-pay | Admitting: Emergency Medicine

## 2013-09-16 NOTE — Telephone Encounter (Signed)
Called and spoke with Corene Cornea from [facility]---  He stated that the pt was sent home from a d/c from [facility] back in may.  At that time it looks like the applied the pt for medicaid to help out with the cpap and supplies.  Today it looks like the medicaid is still pending.  Corene Cornea stated that if the pt gets the supplies and cpap through her insurance/medicaid it will be no charge, but if it is run through [facility] then she will have to $150 up front and then out of pocket for her additional supplies.    Corene Cornea needed to find out if RB wanted the oxygen to be bled into the CPAP.    RB please advise. Thanks  Allergies  Allergen Reactions  . Sulfa Antibiotics Swelling    Lips swelling, rash, itching   Current Outpatient Prescriptions on File Prior to Visit  Medication Sig Dispense Refill  . amLODipine (NORVASC) 2.5 MG tablet Take 2.5 mg by mouth every morning.       Marland Kitchen lisinopril (PRINIVIL,ZESTRIL) 20 MG tablet Take 20 mg by mouth every morning.       Marland Kitchen oxyCODONE (OXY IR/ROXICODONE) 5 MG immediate release tablet Take 1 tablet (5 mg total) by mouth every 4 (four) hours as needed for severe pain.  100 tablet  0  . Rivaroxaban (XARELTO) 20 MG TABS tablet Take 1 tablet (20 mg total) by mouth daily with supper.  30 tablet  12   No current facility-administered medications on file prior to visit.

## 2013-09-21 NOTE — Telephone Encounter (Signed)
She doesn't need O2 bled in. Im not sure what to do about the cost of the supplies. You can forward to the [facility] and see if they have ideas.

## 2013-09-21 NOTE — Telephone Encounter (Signed)
LMTCB

## 2013-09-22 NOTE — Telephone Encounter (Signed)
      Referral Notes     Type Date User    General 09/17/2013 12:13 PM COBB, [place]         Note    Staff message sent to Darlina Guys with [facility] and Jiles Crocker confirmed that patient has been referred to the CPAP Assistance Program. [facility] is aware that patient will be obtaining CPAP through this program and will be shipped to [facility]. Rhonda J Cobb    Nothing more needed at this time.

## 2013-09-22 NOTE — Telephone Encounter (Signed)
Spoke with [name] [name] is aware patient doesn't need O2 bled in . Also, we will send message to [facility] to see if they know of any other routes to go for patient.

## 2013-09-25 ENCOUNTER — Emergency Department (HOSPITAL_COMMUNITY)
Admission: EM | Admit: 2013-09-25 | Discharge: 2013-09-25 | Disposition: A | Discharge status: Home / Self Care | Payer: Self-pay | Attending: Emergency Medicine | Admitting: Emergency Medicine

## 2013-09-25 ENCOUNTER — Encounter (HOSPITAL_COMMUNITY): Payer: Self-pay | Admitting: Emergency Medicine

## 2013-09-25 ENCOUNTER — Emergency Department (HOSPITAL_COMMUNITY): Payer: Self-pay

## 2013-09-25 DIAGNOSIS — Y929 Unspecified place or not applicable: Secondary | ICD-10-CM | POA: Insufficient documentation

## 2013-09-25 DIAGNOSIS — F17200 Nicotine dependence, unspecified, uncomplicated: Secondary | ICD-10-CM | POA: Insufficient documentation

## 2013-09-25 DIAGNOSIS — Z8781 Personal history of (healed) traumatic fracture: Secondary | ICD-10-CM | POA: Insufficient documentation

## 2013-09-25 DIAGNOSIS — Z9981 Dependence on supplemental oxygen: Secondary | ICD-10-CM | POA: Insufficient documentation

## 2013-09-25 DIAGNOSIS — S8990XA Unspecified injury of unspecified lower leg, initial encounter: Secondary | ICD-10-CM | POA: Insufficient documentation

## 2013-09-25 DIAGNOSIS — M545 Low back pain, unspecified: Secondary | ICD-10-CM

## 2013-09-25 DIAGNOSIS — Y939 Activity, unspecified: Secondary | ICD-10-CM | POA: Insufficient documentation

## 2013-09-25 DIAGNOSIS — G4733 Obstructive sleep apnea (adult) (pediatric): Secondary | ICD-10-CM | POA: Insufficient documentation

## 2013-09-25 DIAGNOSIS — S82892A Other fracture of left lower leg, initial encounter for closed fracture: Secondary | ICD-10-CM

## 2013-09-25 DIAGNOSIS — IMO0002 Reserved for concepts with insufficient information to code with codable children: Secondary | ICD-10-CM | POA: Insufficient documentation

## 2013-09-25 DIAGNOSIS — Z79899 Other long term (current) drug therapy: Secondary | ICD-10-CM | POA: Insufficient documentation

## 2013-09-25 DIAGNOSIS — E669 Obesity, unspecified: Secondary | ICD-10-CM | POA: Insufficient documentation

## 2013-09-25 DIAGNOSIS — S92109A Unspecified fracture of unspecified talus, initial encounter for closed fracture: Secondary | ICD-10-CM | POA: Insufficient documentation

## 2013-09-25 DIAGNOSIS — S99929A Unspecified injury of unspecified foot, initial encounter: Secondary | ICD-10-CM

## 2013-09-25 DIAGNOSIS — Z86711 Personal history of pulmonary embolism: Secondary | ICD-10-CM | POA: Insufficient documentation

## 2013-09-25 DIAGNOSIS — S99919A Unspecified injury of unspecified ankle, initial encounter: Secondary | ICD-10-CM

## 2013-09-25 DIAGNOSIS — R296 Repeated falls: Secondary | ICD-10-CM | POA: Insufficient documentation

## 2013-09-25 HISTORY — DX: Obstructive sleep apnea (adult) (pediatric): G47.33

## 2013-09-25 HISTORY — DX: Dependence on other enabling machines and devices: Z99.89

## 2013-09-25 MED ORDER — OXYCODONE-ACETAMINOPHEN 5-325 MG PO TABS: 1.0000 | ORAL_TABLET | ORAL | Status: DC | PRN

## 2013-09-25 MED ORDER — CYCLOBENZAPRINE HCL 10 MG PO TABS: 10.0000 mg | ORAL_TABLET | Freq: Three times a day (TID) | ORAL | Status: DC | PRN

## 2013-09-25 NOTE — Discharge Instructions (Signed)
Ankle Fracture °A fracture is a break in a bone. A cast or splint may be used to protect the ankle and heal the break. Sometimes, surgery is needed. °HOME CARE °· Use crutches as told by your doctor. It is very important that you use your crutches correctly. °· Do not put weight or pressure on the injured ankle until told by your doctor. °· Keep your ankle raised (elevated) when sitting or lying down. °· Apply ice to the ankle: °¨ Put ice in a plastic bag. °¨ Place a towel between your cast and the bag. °¨ Leave the ice on for 20 minutes, 2-3 times a day. °· If you have a plaster or fiberglass cast: °¨ Do not try to scratch under the cast with any objects. °¨ Check the skin around the cast every day. You may put lotion on red or sore areas. °¨ Keep your cast dry and clean. °· If you have a plaster splint: °¨ Wear the splint as told by your doctor. °¨ You can loosen the elastic around the splint if your toes get numb, tingle, or turn cold or blue. °· Do not put pressure on any part of your cast or splint. It may break. Rest your plaster splint or cast only on a pillow the first 24 hours until it is fully hardened. °· Cover your cast or splint with a plastic bag during showers. °· Do not lower your cast or splint into water. °· Take medicine as told by your doctor. °· Do not drive until your doctor says it is safe. °· Follow-up with your doctor as told. It is very important that you go to your follow-up visits. °GET HELP IF: °The swelling and discomfort gets worse.  °GET HELP RIGHT AWAY IF:  °· Your splint or cast breaks. °· You continue to have very bad pain. °· You have new pain or swelling after your splint or cast was put on. °· Your skin or toes below the injured ankle: °¨ Turn blue or gray. °¨ Feel cold, numb, or you cannot feel them. °· There is a bad smell or yellowish white fluid (pus) coming from under the splint or cast. °MAKE SURE YOU:  °· Understand these instructions. °· Will watch your  condition. °· Will get help right away if you are not doing well or get worse. °Document Released: 11/04/2008 Document Revised: 10/28/2012 Document Reviewed: 08/06/2012 °ExitCare® Patient Information ©2015 [employer]. This information is not intended to replace advice given to you by your health care provider. Make sure you discuss any questions you have with your health care provider. ° °

## 2013-09-25 NOTE — ED Notes (Signed)
Pt states she also cannot bear weight on her L. Ankle since fall.

## 2013-09-25 NOTE — ED Provider Notes (Signed)
CSN: [id number]     Arrival date & time 09/25/13  1104 History   First MD Initiated Contact with Patient 09/25/13 1134     Chief Complaint  Patient presents with  . Fall     (Consider location/radiation/quality/duration/timing/severity/associated sxs/prior Treatment) HPI   [name] [name] is a 38 y.o. female with h/o PE and currently on Xarelto and has IVC filter, who presents to the Emergency Department complaining of pain to her lower back for four days.  She states that her back "gave out" which caused her to fall landing on her left knee and twisting the left ankle.  She reports previous fx to the left ankle , but pain now is worse than usual and becomes much worse with attempted weight bearing.  She states that she ran out of her pain medication and tylenol does not help.  She denies incontinence of bladder or bowel, dysuria, abd pain, chest pain, numbness, weakness of swelling of the left leg, shortness of breath or head injury.     Past Medical History  Diagnosis Date  . Closed left ankle fracture     August 30 2012  . Obesity   . Tobacco use disorder   . High blood pressure   . Pulmonary embolism 04/2013  . OSA on CPAP    History reviewed. No pertinent past surgical history. Family History  Problem Relation Age of Onset  . Hypertension Mother   . Diabetes Paternal Aunt   . Diabetes Paternal Uncle    History  Substance Use Topics  . Smoking status: Current Every Day Smoker -- 0.25 packs/day for 18 years    Types: Cigarettes  . Smokeless tobacco: Never Used     Comment: 1 cig per day  . Alcohol Use: Yes   OB History   Grav Para Term Preterm Abortions TAB SAB Ect Mult Living                 Review of Systems  Constitutional: Negative for fever and chills.  Genitourinary: Negative for dysuria and difficulty urinating.  Musculoskeletal: Positive for arthralgias, back pain and joint swelling.       Pain and swelling of the left ankle, lower back  And left  knee.   Skin: Negative for color change and wound.  All other systems reviewed and are negative.     Allergies  Sulfa antibiotics  Home Medications   Prior to Admission medications   Medication Sig Start Date End Date Taking? Authorizing Provider  amLODipine (NORVASC) 2.5 MG tablet Take 2.5 mg by mouth daily.    Yes Historical Provider, MD  lisinopril (PRINIVIL,ZESTRIL) 20 MG tablet Take 20 mg by mouth daily.    Yes Historical Provider, MD  oxyCODONE (OXY IR/ROXICODONE) 5 MG immediate release tablet Take 1 tablet (5 mg total) by mouth every 4 (four) hours as needed for severe pain. 08/02/13  Yes Baird Cancer, PA-C  Rivaroxaban (XARELTO) 20 MG TABS tablet Take 1 tablet (20 mg total) by mouth daily with supper. 05/20/13  Yes Janece Canterbury, MD  oxyCODONE-acetaminophen (PERCOCET/ROXICET) 5-325 MG per tablet Take 1 tablet by mouth every 4 (four) hours as needed. 09/25/13   Levonia Wolfley L. Rayden Scheper, PA-C   BP 154/83  Pulse 96  Temp(Src) 98 F (36.7 C) (Oral)  Resp 22  Ht 5\' 10"  (1.778 m)  Wt 346 lb (156.945 kg)  BMI 49.65 kg/m2  SpO2 99%  LMP 08/29/2013 Physical Exam  Nursing note and vitals reviewed. Constitutional: She is oriented  to person, place, and time. She appears well-developed and well-nourished. No distress.  HENT:  Head: Normocephalic and atraumatic.  Neck: Normal range of motion. Neck supple.  Cardiovascular: Normal rate, regular rhythm, normal heart sounds and intact distal pulses.   No murmur heard. Pulmonary/Chest: Effort normal and breath sounds normal. No respiratory distress.  Musculoskeletal: She exhibits tenderness.  ttp of anterior left ankle, mild STS is present.  DP pulse is brisk,distal sensation intact.  No erythema, abrasion, bruising or bony deformity.  Mild , diffuse ttp of the left knee, pt has full ROM of the knee.  No effusion, step off deformity of the knee.  Pt also has diffuse ttp of the bilateral lumbar paraspinal muscles.  No spinal tenderness.     Neurological: She is alert and oriented to person, place, and time. She exhibits normal muscle tone. Coordination normal.  Skin: Skin is warm and dry. No erythema.    ED Course  Procedures (including critical care time) Labs Review Labs Reviewed - No data to display  Imaging Review Dg Ankle Complete Left  09/25/2013   CLINICAL DATA:  Fall with twisting injury of left ankle and diffuse soft tissue swelling.  EXAM: LEFT ANKLE COMPLETE - 3+ VIEW  COMPARISON:  None.  FINDINGS: Diffuse soft tissue swelling is identified surrounding the ankle and proximal foot. In the lateral projection, there are avulsion fractures involving the neck of the talus and the superior surface of the navicular bone. The talar avulsion shows single mildly displaced fragment. The superior navicular avulsions show mild displacement of several cortical avulsive fragments.  The ankle mortise shows normal alignment. No evidence of ankle fracture.  IMPRESSION: Acute avulsive injuries involving the neck of the talus and superior surface of the navicular bone with mild displacement of several cortical avulsive fragments. Diffuse soft tissue swelling is present.   Electronically Signed   By: Aletta Edouard M.D.   On: 09/25/2013 12:20     EKG Interpretation None      MDM   Final diagnoses:  Avulsion fracture of ankle, left, closed, initial encounter    Patient has her own ASO at home, dispensed crutches.    Currently taking xarelto and has IVC filter.  Compartments of the left LLE are soft.  NV intact.  Full ROM of the left knee. No focal neuro deficits.  She also agrees to close f/u with her orthopedist in [place].  She appears stable for d/c and agrees to plan.   Luman Holway L. Del Wiseman, PA-C 09/25/13 2020

## 2013-09-25 NOTE — ED Notes (Signed)
States her back went out on her Tuesday and has fell since then.  Fell on left knee.

## 2013-09-26 NOTE — ED Provider Notes (Signed)
Medical screening examination/treatment/procedure(s) were performed by non-physician practitioner and as supervising physician I was immediately available for consultation/collaboration.    Johnna Acosta, MD 09/26/13 0700

## 2013-09-28 ENCOUNTER — Other Ambulatory Visit (HOSPITAL_COMMUNITY): Payer: Self-pay | Admitting: Hematology and Oncology

## 2013-09-28 ENCOUNTER — Telehealth (HOSPITAL_COMMUNITY): Payer: Self-pay | Admitting: Hematology and Oncology

## 2013-09-28 MED ORDER — OXYCODONE HCL 5 MG PO TABS: ORAL_TABLET | ORAL | Status: DC

## 2013-10-21 ENCOUNTER — Ambulatory Visit (HOSPITAL_COMMUNITY)
Admission: RE | Admit: 2013-10-21 | Discharge: 2013-10-21 | Disposition: A | Discharge status: Home / Self Care | Payer: Self-pay | Source: Ambulatory Visit | Attending: Hematology and Oncology | Admitting: Hematology and Oncology

## 2013-10-21 DIAGNOSIS — I10 Essential (primary) hypertension: Secondary | ICD-10-CM | POA: Insufficient documentation

## 2013-10-21 DIAGNOSIS — I2602 Saddle embolus of pulmonary artery with acute cor pulmonale: Secondary | ICD-10-CM | POA: Insufficient documentation

## 2013-10-21 MED ORDER — IOHEXOL 350 MG/ML SOLN
150.0000 mL | Freq: Once | INTRAVENOUS | Status: AC | PRN
  Administered 2013-10-21: 125 mL via INTRAVENOUS

## 2013-10-28 ENCOUNTER — Encounter (HOSPITAL_COMMUNITY): Payer: Self-pay | Attending: Hematology

## 2013-10-28 ENCOUNTER — Encounter (HOSPITAL_COMMUNITY): Payer: Self-pay

## 2013-10-28 ENCOUNTER — Encounter (HOSPITAL_BASED_OUTPATIENT_CLINIC_OR_DEPARTMENT_OTHER): Payer: Self-pay

## 2013-10-28 VITALS — BP 184/104 | HR 84 | Temp 98.8°F | Resp 20 | Wt 355.0 lb

## 2013-10-28 DIAGNOSIS — M549 Dorsalgia, unspecified: Secondary | ICD-10-CM

## 2013-10-28 DIAGNOSIS — I82402 Acute embolism and thrombosis of unspecified deep veins of left lower extremity: Secondary | ICD-10-CM

## 2013-10-28 DIAGNOSIS — I2602 Saddle embolus of pulmonary artery with acute cor pulmonale: Secondary | ICD-10-CM | POA: Insufficient documentation

## 2013-10-28 DIAGNOSIS — M545 Low back pain: Secondary | ICD-10-CM | POA: Insufficient documentation

## 2013-10-28 DIAGNOSIS — I2699 Other pulmonary embolism without acute cor pulmonale: Secondary | ICD-10-CM

## 2013-10-28 DIAGNOSIS — M79605 Pain in left leg: Secondary | ICD-10-CM | POA: Insufficient documentation

## 2013-10-28 DIAGNOSIS — M79609 Pain in unspecified limb: Secondary | ICD-10-CM

## 2013-10-28 DIAGNOSIS — Z87891 Personal history of nicotine dependence: Secondary | ICD-10-CM | POA: Insufficient documentation

## 2013-10-28 DIAGNOSIS — Z7901 Long term (current) use of anticoagulants: Secondary | ICD-10-CM | POA: Insufficient documentation

## 2013-10-28 DIAGNOSIS — M5410 Radiculopathy, site unspecified: Secondary | ICD-10-CM

## 2013-10-28 LAB — SEDIMENTATION RATE: Sed Rate: 25 mm/hr — ABNORMAL HIGH (ref 0–22)

## 2013-10-28 LAB — HIV ANTIBODY (ROUTINE TESTING W REFLEX): HIV: NONREACTIVE

## 2013-10-28 LAB — PRO B NATRIURETIC PEPTIDE: PRO B NATRI PEPTIDE: 95.8 pg/mL (ref 0–125)

## 2013-10-28 NOTE — Progress Notes (Signed)
LABS FOR PBNP,HIV, ESR 

## 2013-10-28 NOTE — Patient Instructions (Signed)
[facility] Discharge Instructions  RECOMMENDATIONS MADE BY THE CONSULTANT AND ANY TEST RESULTS WILL BE SENT TO YOUR REFERRING PHYSICIAN.  EXAM FINDINGS BY THE PHYSICIAN TODAY AND SIGNS OR SYMPTOMS TO REPORT TO CLINIC OR PRIMARY PHYSICIAN: Exam and findings as discussed by Dr.Heller.  MEDICATIONS PRESCRIBED:  continue as prescribed.   INSTRUCTIONS/FOLLOW-UP: Return to primary within next 2 weeks for follow up. We will do additional lab work today. We will call you with those results. (It will take 5-7 days for all results) We will schedule you for an MRI of your lumbar spine. We will call you tomorrow with the appointment. Return to this clinic in 3 months for follow up.  Thank you for choosing [facility] to provide your oncology and hematology care.  To afford each patient quality time with our providers, please arrive at least 15 minutes before your scheduled appointment time.  With your help, our goal is to use those 15 minutes to complete the necessary work-up to ensure our physicians have the information they need to help with your evaluation and healthcare recommendations.    Effective January 1st, 2014, we ask that you re-schedule your appointment with our physicians should you arrive 10 or more minutes late for your appointment.  We strive to give you quality time with our providers, and arriving late affects you and other patients whose appointments are after yours.    Again, thank you for choosing [facility].  Our hope is that these requests will decrease the amount of time that you wait before being seen by our physicians.       _____________________________________________________________  Should you have questions after your visit to [facility], please contact our office at [phone] between the hours of 8:30 a.m. and 4:30 p.m.  Voicemails left after 4:30 p.m. will not be returned until the following business  day.  For prescription refill requests, have your pharmacy contact our office with your prescription refill request.    _______________________________________________________________  We hope that we have given you very good care.  You may receive a patient satisfaction survey in the mail, please complete it and return it as soon as possible.  We value your feedback!  _______________________________________________________________  Have you asked about our STAR program?  STAR stands for Survivorship Training and Rehabilitation, and this is a nationally recognized cancer care program that focuses on survivorship and rehabilitation.  Cancer and cancer treatments may cause problems, such as, pain, making you feel tired and keeping you from doing the things that you need or want to do. Cancer rehabilitation can help. Our goal is to reduce these troubling effects and help you have the best quality of life possible.  You may receive a survey from a nurse that asks questions about your current state of health.  Based on the survey results, all eligible patients will be referred to the [facility] program for an evaluation so we can better serve you!  A frequently asked questions sheet is available upon request.

## 2013-10-29 ENCOUNTER — Other Ambulatory Visit (HOSPITAL_COMMUNITY): Payer: Self-pay | Admitting: Oncology

## 2013-10-29 DIAGNOSIS — M5442 Lumbago with sciatica, left side: Secondary | ICD-10-CM

## 2013-10-29 DIAGNOSIS — R0781 Pleurodynia: Secondary | ICD-10-CM

## 2013-10-29 DIAGNOSIS — I2699 Other pulmonary embolism without acute cor pulmonale: Secondary | ICD-10-CM

## 2013-10-29 DIAGNOSIS — M79605 Pain in left leg: Secondary | ICD-10-CM

## 2013-10-29 MED ORDER — OXYCODONE HCL 5 MG PO TABS: ORAL_TABLET | ORAL | Status: DC

## 2013-10-31 DIAGNOSIS — M5410 Radiculopathy, site unspecified: Secondary | ICD-10-CM | POA: Insufficient documentation

## 2013-10-31 NOTE — Progress Notes (Signed)
[facility] OFFICE PROGRESS NOTE  PCP Yevette Edwards, NP [place] [address]  DIAGNOSIS: Saddle embolism of involving the right and left pulmonary arteries with extension and right heart strain and LLE DVT                        S/P placement of IVC filter                        Thrombophilia evaluation inconclusive                        Morbid obesity  CURRENT THERAPY:  Xarelto 20mg .   INTERVAL HEMATOLOGY/ONCOLOGY HX: [name] is a single 38 yo female who was didagnosed with a DVT on 04/28/2013. Her father takes coumadin for a clotting problem. Recent thrombophilia evaluation by Dr. Barnet Glasgow did not show evidence for a congenital or acquired abnormality. She has smoked in the past. Her chief complaint is for sciatica type left leg pain which restricts her activities. Dr. Lamonte Sakai is her PCP. The patient sustained a fall 1 month ago and was treated in the ED for an avulsive injury of the navicular bone associated with soft tissue swelling and referred back to her orthopedist. She did not sustain a hematoma or purpura. Previously, the patient had been prescribed oxyIR for pain associated with her VTE.     MEDICAL HISTORY:  Past Medical History  Diagnosis Date  . Closed left ankle fracture     August 30 2012  . Obesity   . Tobacco use disorder   . High blood pressure   . Pulmonary embolism 04/2013  . OSA on CPAP     has PE (pulmonary embolism); Pleuritic chest pain; SOB (shortness of breath); Hyperglycemia; Anemia; Tobacco use disorder; Saddle embolus of pulmonary artery with acute cor pulmonale; Secondary pulmonary hypertension; Obstructive sleep apnea; and Pulmonary embolism on her problem list.    ALLERGIES:  is allergic to sulfa antibiotics.  MEDICATIONS: has a current medication list which includes the following prescription(s): amlodipine, cyclobenzaprine, lisinopril, oxycodone-acetaminophen, rivaroxaban, and  oxycodone.  FAMILY HISTORY: family history includes Diabetes in her paternal aunt and paternal uncle; Hypertension in her mother.  REVIEW OF SYSTEMS:      Pain Assessment Pain Score: 5  Pain Location: Back Pain Orientation: Lower Pain Descriptors / Indicators: Constant;Aching;Discomfort Pain Frequency: Constant Pain Onset: On-going Pain Intervention(s): Medication (See eMAR) Significant Pain in Recent Past: Yes, chronic  Other than that discussed above is noncontributory.    PHYSICAL EXAMINATION:   weight is 355 lb (161.027 kg). Her oral temperature is 98.8 F (37.1 C). Her blood pressure is 184/104 and her pulse is 84. Her respiration is 20 and oxygen saturation is 100%.    GENERAL:alert, no distress and comfortable EYES: PERRL, EOM intact. Conjunctiva are pink and sclera clear OROPHARYNX: no exudate, no erythema and lips and tongue normal  NECK: supple, neck is obese, non-tender, without nodularity or adenopathy CHEST/BREASTS:  deferred LUNGS: clear to auscultation and percussion with normal breathing effort HEART: regular rate & rhythm and no murmurs, S3, or S4 ABDOMEN: soft, non-tender, normal bowel sounds;  Liver and spleen not grossly enlarged  MUSCULOSKELETAL: Spine without localized tenderness except in the lumbar area. SLR negative at 30 degrees. Left calf leg is slightly larger than the opposite leg. Negative Homan's sign   SKIN: no jaundice, bruises, or rashes.  NEURO: alert & oriented x 3 with fluent speech, no focal motor/sensory deficits.      LABORATORY DATA: Office Visit on 10/28/2013  Component Date Value Ref Range Status  . HIV 1&2 Ab, 4th Generation 10/28/2013 NONREACTIVE  NONREACTIVE Final   Comment: (NOTE)                          A NONREACTIVE HIV Ag/Ab result does not exclude HIV infection since                          the time frame for seroconversion is variable. If acute HIV infection                          is suspected, a HIV-1 RNA  Qualitative TMA test is recommended.                          HIV-1/2 Antibody Diff         Not indicated.                          HIV-1 RNA, Qual TMA           Not indicated.                          PLEASE NOTE: This information has been disclosed to you from records                          whose confidentiality may be protected by state law. If your state                          requires such protection, then the state law prohibits you from making                          any further disclosure of the information without the specific written                          consent of the person to whom it pertains, or as otherwise permitted                          by law. A general authorization for the release of medical or other                          information is NOT sufficient for this purpose.                          The performance of this assay has not been clinically validated in                          patients less than 22 years old.                          Performed at [facility]  . Pro B Natriuretic peptide (BNP) 10/28/2013 95.8  0 - 125 pg/mL Final  . Sed Rate 10/28/2013  25* 0 - 22 mm/hr Final     RADIOGRAPHIC STUDIES: Ct Angio Chest Pe W/cm &/or Wo Cm  10/21/2013   CLINICAL DATA:  Follow up pulmonary embolism in April with acute cor pulmonale. History of hypertension and IVC filter. Subsequent encounter.  EXAM: CT ANGIOGRAPHY CHEST WITH CONTRAST  TECHNIQUE: Multidetector CT imaging of the chest was performed using the standard protocol during bolus administration of intravenous contrast. Multiplanar CT image reconstructions and MIPs were obtained to evaluate the vascular anatomy.  CONTRAST:  175mL OMNIPAQUE IOHEXOL 350 MG/ML SOLN  COMPARISON:  Chest CTA 05/17/2013.  FINDINGS: Vascular: The pulmonary arteries are suboptimally opacified with contrast. There has been interval near-complete resolution of the extensive pulmonary emboli demonstrated previously. There is a  small residual filling defect in the proximal right pulmonary artery on image 43 of series 7. There are few linear filling defects in the segmental portions of both pulmonary emboli. No recurrent acute pulmonary emboli are demonstrated. There is no significant atherosclerosis.There is no significant residual dilatation of the right ventricle. The RV to LV ratio is now less than 1.  Mediastinum: There are no enlarged mediastinal or hilar lymph nodes. The thyroid gland, trachea and esophagus appear normal.  Lungs/Pleura: There is no pleural or pericardial effusion.There is minimal patchy airspace disease anteriorly in the right upper lobe, best seen on image 36. There is no confluent airspace opacity or suspicious pulmonary nodule.  Upper abdomen: Unremarkable.  There is no adrenal mass.  Musculoskeletal/Chest wall: No chest wall lesion or acute osseous findings.Small axillary lymph nodes are stable, not pathologically enlarged.  Review of the MIP images confirms the above findings.  IMPRESSION: 1. Interval near-complete lysis of previously demonstrated extensive bilateral pulmonary emboli. There are small residual adherent filling defects as described, but no evidence of recurrent pulmonary embolism. 2. Minimal patchy airspace disease anteriorly in the right upper lobe, likely the sequela of pulmonary embolism or inflammation. No consolidation or suspicious pulmonary nodule. No pleural effusion. 3. No signs of residual right heart strain.   Electronically Signed   By: Camie Patience M.D.   On: 10/21/2013 10:37    ASSESSMENT:   1. Acute VTE with a suspicious family history for increased clotting and personal history of 2 miscarriages. Presentation is highly suggestive  of potential for recurrence and consequently extended treatment with Xarelto or another form of anticoagulation is recommended  2. Left sided back and leg pain suggestive of DDD or DJD.       RECOMMENDATIONS/PLAN:   Continue Xarelto as advised  above. We have taken the liberty of scheduling the patient for an open MRI of the L-S spine.         All questions were answered. Will schedule the patient for a return visit in 6 months    Darrall Dears, MD 10/31/2013 7:41 PM

## 2013-11-09 ENCOUNTER — Other Ambulatory Visit: Payer: Self-pay

## 2013-12-08 ENCOUNTER — Other Ambulatory Visit (HOSPITAL_COMMUNITY): Payer: Self-pay | Admitting: Hematology and Oncology

## 2013-12-08 ENCOUNTER — Telehealth (HOSPITAL_COMMUNITY): Payer: Self-pay | Admitting: *Deleted

## 2013-12-08 DIAGNOSIS — R0781 Pleurodynia: Secondary | ICD-10-CM

## 2013-12-08 DIAGNOSIS — I2699 Other pulmonary embolism without acute cor pulmonale: Secondary | ICD-10-CM

## 2013-12-08 MED ORDER — OXYCODONE HCL 5 MG PO TABS: ORAL_TABLET | ORAL | Status: DC

## 2013-12-21 ENCOUNTER — Ambulatory Visit (INDEPENDENT_AMBULATORY_CARE_PROVIDER_SITE_OTHER): Payer: Self-pay | Admitting: Emergency Medicine

## 2013-12-21 ENCOUNTER — Telehealth: Payer: Self-pay | Admitting: Emergency Medicine

## 2013-12-21 ENCOUNTER — Encounter: Payer: Self-pay | Admitting: Emergency Medicine

## 2013-12-21 VITALS — BP 136/88 | HR 93 | Ht 73.0 in | Wt 365.4 lb

## 2013-12-21 DIAGNOSIS — I272 Other secondary pulmonary hypertension: Secondary | ICD-10-CM

## 2013-12-21 DIAGNOSIS — I2699 Other pulmonary embolism without acute cor pulmonale: Secondary | ICD-10-CM

## 2013-12-21 DIAGNOSIS — G4733 Obstructive sleep apnea (adult) (pediatric): Secondary | ICD-10-CM

## 2013-12-21 DIAGNOSIS — IMO0002 Reserved for concepts with insufficient information to code with codable children: Secondary | ICD-10-CM

## 2013-12-21 NOTE — Assessment & Plan Note (Signed)
I will wait to repeat her echocardiogram until after she has gotten on CPAP

## 2013-12-21 NOTE — Progress Notes (Signed)
Subjective:    Patient ID: [name] [name], female    DOB: 1975/08/31, 38 y.o.   MRN: [id number]  HPI 38 yo smoker, HTN, obesity, recent admission for PE with associated PAH and RV dilation. She is being treated with xarelto.  She has a hx of dyspnea even preceding the PE. She is a bit better, but still some with exertion.  She snores, has some witnessed apneas and jerks herself awake. She has L>R LE edema. No cough or wheeze.   ROV 09/10/13 -- 38 yo woman, hx PE in 04/2013, OSA, secondary PAH. PSG > shows moderate OSA that responded to CPAP 9cm H2O. She went to have IVCF extracted but there was clot in the filter so they left it in. It will likely be permanent. She has needed O2 with exertion, breathing has been stable.   ROV 12/21/13 -- follow-up visit for secondary pulmonary hypertension in the setting of pulmonary embolism and obstructive sleep apnea. She has a permanent IVC filter. We ordered CPAP mask, doesn't have it yet - trying to get it through an assistance program but not yet done. She is getting her xarelto free until April. She has a dry cough, bothers her more at night. She has occasional reflux.  We have not repeated her TTE yet.    Review of Systems  Constitutional: Negative for fever and unexpected weight change.  HENT: Negative for congestion, dental problem, ear pain, nosebleeds, postnasal drip, rhinorrhea, sinus pressure, sneezing, sore throat and trouble swallowing.   Eyes: Negative for redness and itching.  Respiratory: Positive for cough and shortness of breath. Negative for chest tightness and wheezing.   Cardiovascular: Negative for chest pain, palpitations and leg swelling.       Hand and feet  Gastrointestinal: Negative for nausea, vomiting and abdominal distention.  Genitourinary: Negative for dysuria.  Musculoskeletal: Negative for joint swelling.  Skin: Negative for rash.  Hematological: Does not bruise/bleed easily.  Psychiatric/Behavioral: Negative for  dysphoric mood. The patient is not nervous/anxious.       Objective:   Physical Exam Filed Vitals:   12/21/13 0921  BP: 136/88  Pulse: 93  Height: 6\' 1"  (1.854 m)  Weight: 365 lb 6.4 oz (165.744 kg)  SpO2: 98%   Gen: Pleasant, obese woman, in no distress,  normal affect  ENT: No lesions,  mouth clear,  oropharynx clear, no postnasal drip  Neck: No JVD, no TMG, no carotid bruits  Lungs: No use of accessory muscles, clear without rales or rhonchi  Cardiovascular: RRR, heart sounds normal, no murmur or gallops, no peripheral edema  Musculoskeletal: No deformities, no cyanosis or clubbing, no calf tenderness  Neuro: alert, non focal  Skin: Warm, no lesions or rashes    04/27/13 --  COMPARISON: None.  FINDINGS:  A large saddle embolus is appreciated. Extending from the distal  main pulmonary artery and to the left and right pulmonary arteries  with extension into the left upper lobe, left lower lobe and lingula  branches. There is right upper lobe, right middle lobe and right  lower lobe extension. Areas of subsegmental extension appreciated.  RV LV ratio 1.65. There are areas of intraventricular septal bowing  to the right.  The thoracic inlet is unremarkable.  There is no evidence of mediastinal masses or adenopathy.  The lungs are clear.  Visualized upper abdominal viscera are unremarkable.  Review of the MIP images confirms the above findings.  IMPRESSION:  Positive for acute PE with CTevidence of right heart  strain (RV/LV  Ratio = 1.65 ) consistent with at least submassive (intermediate  risk) PE. The presence of right heart strain has been associated  with an increased risk of morbidity and mortality.    04/28/13 --  Study Conclusions - Study data: Technically difficult study. - Procedure narrative: Transthoracic echocardiography. Image quality was adequate. The study was technically difficult, as a result of body habitus. - Left ventricle: The cavity size was  normal. Wall thickness was increased in a pattern of moderate LVH. Systolic function was vigorous. The estimated ejection fraction was in the range of 65% to 70%. Wall motion was normal; there were no regional wall motion abnormalities. Doppler parameters are consistent with abnormal left ventricular relaxation (grade 1 diastolic dysfunction). - Aortic valve: Valve area: 2.01cm^2(VTI). Valve area: 2.11cm^2 (Vmax). - Right ventricle: There is systolic flattening of the interventricular septum consistent with RV pressure overload. The cavity size was severely dilated. Systolic function was mildly reduced. - Pulmonary arteries: Systolic pressure was moderately increased. PA peak pressure: 52mm Hg (S).      Assessment & Plan:  Secondary pulmonary hypertension I will wait to repeat her echocardiogram until after she has gotten on CPAP  PE (pulmonary embolism) Continue xarelto as she is currently taking it. Her supply will no longer be free in April 2016. We will need to work to find an alternative means to obtain the medication or possibly change her to warfarin  Obstructive sleep apnea We will work to obtain her CPAP. She is we'll be able to get this if she is able to pay $100 donation

## 2013-12-21 NOTE — Telephone Encounter (Signed)
Per Anderson Malta note not needed due to pt was just seen today.

## 2013-12-21 NOTE — Assessment & Plan Note (Signed)
Continue xarelto as she is currently taking it. Her supply will no longer be free in April 2016. We will need to work to find an alternative means to obtain the medication or possibly change her to warfarin

## 2013-12-21 NOTE — Patient Instructions (Signed)
Please continue your Xarelto. We will work on ensuring that this medicine will still be available after April 2015 Work on getting your CPAP and wear it every night.  Follow with Dr Lamonte Sakai in March 2016

## 2013-12-21 NOTE — Assessment & Plan Note (Signed)
We will work to obtain her CPAP. She is we'll be able to get this if she is able to pay $100 donation

## 2014-01-06 ENCOUNTER — Telehealth (HOSPITAL_COMMUNITY): Payer: Self-pay | Admitting: Hematology and Oncology

## 2014-01-06 ENCOUNTER — Other Ambulatory Visit (HOSPITAL_COMMUNITY): Payer: Self-pay | Admitting: Hematology and Oncology

## 2014-01-06 DIAGNOSIS — R0781 Pleurodynia: Secondary | ICD-10-CM

## 2014-01-06 DIAGNOSIS — I2699 Other pulmonary embolism without acute cor pulmonale: Secondary | ICD-10-CM

## 2014-01-06 MED ORDER — OXYCODONE HCL 5 MG PO TABS: ORAL_TABLET | ORAL | Status: DC

## 2014-01-07 ENCOUNTER — Telehealth (HOSPITAL_COMMUNITY): Payer: Self-pay | Admitting: Oncology

## 2014-01-27 ENCOUNTER — Encounter (HOSPITAL_COMMUNITY): Payer: Self-pay | Attending: Hematology & Oncology | Admitting: Hematology & Oncology

## 2014-01-27 ENCOUNTER — Encounter (HOSPITAL_COMMUNITY): Payer: Self-pay | Admitting: Hematology & Oncology

## 2014-01-27 VITALS — BP 153/82 | HR 98 | Temp 99.0°F | Resp 20 | Wt 351.2 lb

## 2014-01-27 DIAGNOSIS — F17210 Nicotine dependence, cigarettes, uncomplicated: Secondary | ICD-10-CM | POA: Insufficient documentation

## 2014-01-27 DIAGNOSIS — O039 Complete or unspecified spontaneous abortion without complication: Secondary | ICD-10-CM

## 2014-01-27 DIAGNOSIS — G4733 Obstructive sleep apnea (adult) (pediatric): Secondary | ICD-10-CM | POA: Insufficient documentation

## 2014-01-27 DIAGNOSIS — D500 Iron deficiency anemia secondary to blood loss (chronic): Secondary | ICD-10-CM | POA: Insufficient documentation

## 2014-01-27 DIAGNOSIS — Z9981 Dependence on supplemental oxygen: Secondary | ICD-10-CM | POA: Insufficient documentation

## 2014-01-27 DIAGNOSIS — Z7901 Long term (current) use of anticoagulants: Secondary | ICD-10-CM | POA: Insufficient documentation

## 2014-01-27 DIAGNOSIS — R0602 Shortness of breath: Secondary | ICD-10-CM | POA: Insufficient documentation

## 2014-01-27 DIAGNOSIS — I2602 Saddle embolus of pulmonary artery with acute cor pulmonale: Secondary | ICD-10-CM

## 2014-01-27 DIAGNOSIS — I2699 Other pulmonary embolism without acute cor pulmonale: Secondary | ICD-10-CM

## 2014-01-27 DIAGNOSIS — N920 Excessive and frequent menstruation with regular cycle: Secondary | ICD-10-CM | POA: Insufficient documentation

## 2014-01-27 DIAGNOSIS — I272 Other secondary pulmonary hypertension: Secondary | ICD-10-CM | POA: Insufficient documentation

## 2014-01-27 DIAGNOSIS — Z86711 Personal history of pulmonary embolism: Secondary | ICD-10-CM | POA: Insufficient documentation

## 2014-01-27 LAB — CBC WITH DIFFERENTIAL/PLATELET
BASOS PCT: 0 % (ref 0–1)
Basophils Absolute: 0 10*3/uL (ref 0.0–0.1)
EOS PCT: 1 % (ref 0–5)
Eosinophils Absolute: 0.1 10*3/uL (ref 0.0–0.7)
HEMATOCRIT: 31.4 % — AB (ref 36.0–46.0)
HEMOGLOBIN: 8.5 g/dL — AB (ref 12.0–15.0)
Lymphocytes Relative: 14 % (ref 12–46)
Lymphs Abs: 0.8 10*3/uL (ref 0.7–4.0)
MCH: 18.6 pg — AB (ref 26.0–34.0)
MCHC: 27.1 g/dL — AB (ref 30.0–36.0)
MCV: 68.9 fL — AB (ref 78.0–100.0)
MONO ABS: 0.3 10*3/uL (ref 0.1–1.0)
MONOS PCT: 6 % (ref 3–12)
NEUTROS ABS: 4.5 10*3/uL (ref 1.7–7.7)
Neutrophils Relative %: 79 % — ABNORMAL HIGH (ref 43–77)
Platelets: 369 10*3/uL (ref 150–400)
RBC: 4.56 MIL/uL (ref 3.87–5.11)
RDW: 19.4 % — ABNORMAL HIGH (ref 11.5–15.5)
WBC: 5.7 10*3/uL (ref 4.0–10.5)

## 2014-01-27 LAB — IRON AND TIBC
IRON: 12 ug/dL — AB (ref 42–145)
Saturation Ratios: 2 % — ABNORMAL LOW (ref 20–55)
TIBC: 504 ug/dL — AB (ref 250–470)
UIBC: 492 ug/dL — AB (ref 125–400)

## 2014-01-27 LAB — COMPREHENSIVE METABOLIC PANEL
ALT: 14 U/L (ref 0–35)
ANION GAP: 10 (ref 5–15)
AST: 19 U/L (ref 0–37)
Albumin: 4.4 g/dL (ref 3.5–5.2)
Alkaline Phosphatase: 66 U/L (ref 39–117)
BUN: 7 mg/dL (ref 6–23)
CALCIUM: 9.5 mg/dL (ref 8.4–10.5)
CO2: 22 mmol/L (ref 19–32)
CREATININE: 0.79 mg/dL (ref 0.50–1.10)
Chloride: 106 mEq/L (ref 96–112)
GLUCOSE: 83 mg/dL (ref 70–99)
Potassium: 3.7 mmol/L (ref 3.5–5.1)
Sodium: 138 mmol/L (ref 135–145)
TOTAL PROTEIN: 9.3 g/dL — AB (ref 6.0–8.3)
Total Bilirubin: 0.5 mg/dL (ref 0.3–1.2)

## 2014-01-27 LAB — FERRITIN: FERRITIN: 8 ng/mL — AB (ref 10–291)

## 2014-01-27 LAB — D-DIMER, QUANTITATIVE: D-Dimer, Quant: 0.76 ug/mL-FEU — ABNORMAL HIGH (ref 0.00–0.48)

## 2014-01-27 LAB — VITAMIN B12: VITAMIN B 12: 546 pg/mL (ref 211–911)

## 2014-01-27 LAB — FOLATE: Folate: 14.7 ng/mL

## 2014-01-27 MED ORDER — OXYCODONE HCL 10 MG PO TABS: 10.0000 mg | ORAL_TABLET | ORAL | Status: DC | PRN

## 2014-01-27 NOTE — Progress Notes (Signed)
[name] [name]'s reason for visit today are for labs as scheduled per MD orders.  Venipuncture performed with a 23 gauge butterfly needle to R Basilic.  [name] [name] tolerated venipuncture well and without incident; questions were answered and patient was discharged.

## 2014-01-27 NOTE — Progress Notes (Signed)
[name] [name], [place] [address]  Pulmonary embolus,  CT 04/27/2013 saddle embolism IVC filter 04/29/2013 LE Ultrasound  2 miscarriages one at 9 weeks, 6 weeks Iron deficiency Anemia with pagophagia, heavy menses on Xarelto Smokes 2 to 3 cigarettes a day   CURRENT THERAPY:Xarelto  INTERVAL HISTORY: SHAM ALVIAR 39 y.o. female returns for follow-up of her history of PE. She wears O2 at home. She still complains of SOB. She takes her Xarelto every day. She denies any bleeding. She does state she eats ice daily and craves it. She has heavy menstrual cycles.   MEDICAL HISTORY: Past Medical History  Diagnosis Date  . Closed left ankle fracture     August 30 2012  . Obesity   . Tobacco use disorder   . High blood pressure   . Pulmonary embolism 04/2013  . OSA on CPAP     has PE (pulmonary embolism); Pleuritic chest pain; SOB (shortness of breath); Hyperglycemia; Anemia; Tobacco use disorder; Saddle embolus of pulmonary artery with acute cor pulmonale; Secondary pulmonary hypertension; Obstructive sleep apnea; Pulmonary embolism; Back pain with left-sided radiculopathy; and Iron deficiency anemia due to chronic blood loss on her problem list.     No history exists.     is allergic to sulfa antibiotics.  Ms. Mckissack does not currently have medications on file.  SURGICAL HISTORY: History reviewed. No pertinent past surgical history.  SOCIAL HISTORY: History   Social History  . Marital Status: Widowed    Spouse Name: N/A    Number of Children: N/A  . Years of Education: N/A   Occupational History  . Not on file.   Social History Main Topics  . Smoking status: Current Every Day Smoker -- 0.25 packs/day for 18 years    Types: Cigarettes  . Smokeless tobacco: Never Used     Comment: 1 cig per day  . Alcohol Use: Yes  . Drug Use: No  . Sexual Activity: Yes    Birth Control/ Protection: None   Other Topics Concern  . Not on file    Social History Narrative    FAMILY HISTORY: Family History  Problem Relation Age of Onset  . Hypertension Mother   . Diabetes Paternal Aunt   . Diabetes Paternal Uncle     Review of Systems  Constitutional: Negative.   HENT: Negative.   Eyes: Negative for blurred vision, double vision, photophobia, pain, discharge and redness.       Wears glasses  Respiratory: Positive for shortness of breath. Negative for cough, hemoptysis, sputum production and wheezing.   Cardiovascular: Negative.   Gastrointestinal: Positive for heartburn. Negative for nausea, vomiting, abdominal pain, diarrhea, constipation, blood in stool and melena.  Genitourinary: Negative.   Musculoskeletal: Positive for back pain and joint pain. Negative for myalgias, falls and neck pain.  Skin: Negative.   Neurological: Negative.   Endo/Heme/Allergies: Negative.   Psychiatric/Behavioral: Negative.     PHYSICAL EXAMINATION  ECOG PERFORMANCE STATUS: 1 - Symptomatic but completely ambulatory  Filed Vitals:   01/27/14 1000  BP: 153/82  Pulse: 98  Temp: 99 F (37.2 C)  Resp: 20    Physical Exam  Constitutional: She is oriented to person, place, and time and well-developed, well-nourished, and in no distress.  HENT:  Head: Normocephalic and atraumatic.  Nose: Nose normal.  Mouth/Throat: Oropharynx is clear and moist. No oropharyngeal exudate.  Eyes: Conjunctivae and EOM are normal. Pupils are equal, round, and reactive to light.  Right eye exhibits no discharge. Left eye exhibits no discharge. No scleral icterus.  Neck: Normal range of motion. Neck supple. No tracheal deviation present. No thyromegaly present.  Cardiovascular: Normal rate, regular rhythm and normal heart sounds.  Exam reveals no gallop and no friction rub.   No murmur heard. Pulmonary/Chest: Effort normal and breath sounds normal. She has no wheezes. She has no rales.  Abdominal: Soft. Bowel sounds are normal. She exhibits no distension and  no mass. There is no tenderness. There is no rebound and no guarding.  Musculoskeletal: Normal range of motion. She exhibits no edema.  Lymphadenopathy:    She has no cervical adenopathy.  Neurological: She is alert and oriented to person, place, and time. She has normal reflexes. No cranial nerve deficit. Gait normal. Coordination normal.  Skin: Skin is warm and dry. No rash noted.  Psychiatric: Mood, memory, affect and judgment normal.  Nursing note and vitals reviewed.   LABORATORY DATA:  CBC    Component Value Date/Time   WBC 5.7 01/27/2014 1156   RBC 4.56 01/27/2014 1156   HGB 8.5* 01/27/2014 1156   HCT 31.4* 01/27/2014 1156   PLT 369 01/27/2014 1156   MCV 68.9* 01/27/2014 1156   MCH 18.6* 01/27/2014 1156   MCHC 27.1* 01/27/2014 1156   RDW 19.4* 01/27/2014 1156   LYMPHSABS 0.8 01/27/2014 1156   MONOABS 0.3 01/27/2014 1156   EOSABS 0.1 01/27/2014 1156   BASOSABS 0.0 01/27/2014 1156   CMP     Component Value Date/Time   NA 138 01/27/2014 1156   K 3.7 01/27/2014 1156   CL 106 01/27/2014 1156   CO2 22 01/27/2014 1156   GLUCOSE 83 01/27/2014 1156   BUN 7 01/27/2014 1156   CREATININE 0.79 01/27/2014 1156   CALCIUM 9.5 01/27/2014 1156   PROT 9.3* 01/27/2014 1156   ALBUMIN 4.4 01/27/2014 1156   AST 19 01/27/2014 1156   ALT 14 01/27/2014 1156   ALKPHOS 66 01/27/2014 1156   BILITOT 0.5 01/27/2014 1156   GFRNONAA >90 01/27/2014 1156   GFRAA >90 01/27/2014 1156      RADIOGRAPHIC STUDIES: EXAM: CT ANGIOGRAPHY CHEST WITH CONTRAST  IMPRESSION: Positive for acute PE with CTevidence of right heart strain (RV/LV Ratio = 1.65 ) consistent with at least submassive (intermediate risk) PE. The presence of right heart strain has been associated with an increased risk of morbidity and mortality. Critical Value/emergent results were called by telephone at the time of interpretation on 04/27/2013 at 6:19 PM to Dr. Milton Ferguson , who verbally acknowledged these  results.   Electronically Signed  By: Margaree Mackintosh M.D.  On: 04/27/2013 18:20 IMPRESSION: Positive for acute PE with CTevidence of right heart strain (RV/LV Ratio = 1.65 ) consistent with at least submassive (intermediate risk) PE. The presence of right heart strain has been associated with an increased risk of morbidity and mortality. Critical Value/emergent results were called by telephone at the time of interpretation on 04/27/2013 at 6:19 PM to Dr. Milton Ferguson , who verbally acknowledged these results.   Electronically Signed  By: Margaree Mackintosh M.D.  On: 04/27/2013 18:20    ASSESSMENT and THERAPY PLAN:    Saddle embolus of pulmonary artery with acute cor pulmonale 39 year old female with a history of multiple miscarriages, saddle PE.  She is currently on Xarelto. I have recommended repeating part of her thrombophilia evaluation. She is agreeable.  She is tolerating Xarelto without any problems. Plan is for her to continue.  I will  advise her of her laboratory study results when they become available.   Iron deficiency anemia due to chronic blood loss Carlethia has pagophagia. She has heavy menstrual cycles which she states have worsened on Xarelto.  She has microcytic anemia. I will check iron studies and recommend IV iron replacement.  I have discussed this with her, including risks and benefits of IV iron therapy.  She does not tolerate iron pills as she has tried them in the past.  Once all labs are available we will calculate her iron deficit and proceed with iron replacement.     All questions were answered. The patient knows to call the clinic with any problems, questions or concerns. We can certainly see the patient much sooner if necessary.    Molli Hazard 02/05/2014

## 2014-01-27 NOTE — Patient Instructions (Signed)
[facility] Discharge Instructions  RECOMMENDATIONS MADE BY THE CONSULTANT AND ANY TEST RESULTS WILL BE SENT TO YOUR REFERRING PHYSICIAN.  We will call you with your iron results and start you on iron replacement before you come back. I will see you back in 3 weeks to go over your lab results. Please call in the interim with problems or concerns  Thank you for choosing [facility] to provide your oncology and hematology care.  To afford each patient quality time with our providers, please arrive at least 15 minutes before your scheduled appointment time.  With your help, our goal is to use those 15 minutes to complete the necessary work-up to ensure our physicians have the information they need to help with your evaluation and healthcare recommendations.    Effective January 1st, 2014, we ask that you re-schedule your appointment with our physicians should you arrive 10 or more minutes late for your appointment.  We strive to give you quality time with our providers, and arriving late affects you and other patients whose appointments are after yours.    Again, thank you for choosing [facility].  Our hope is that these requests will decrease the amount of time that you wait before being seen by our physicians.       _____________________________________________________________  Should you have questions after your visit to [facility], please contact our office at [phone] between the hours of 8:30 a.m. and 5:00 p.m.  Voicemails left after 4:30 p.m. will not be returned until the following business day.  For prescription refill requests, have your pharmacy contact our office with your prescription refill request.

## 2014-01-28 LAB — CARDIOLIPIN ANTIBODIES, IGG, IGM, IGA
ANTICARDIOLIPIN IGG: 8 GPL U/mL — AB (ref <23)
ANTICARDIOLIPIN IGM: 0 [MPL'U]/mL — AB (ref <11)
Anticardiolipin IgA: 10 APL U/mL — ABNORMAL LOW (ref <22)

## 2014-01-28 LAB — BETA-2-GLYCOPROTEIN I ABS, IGG/M/A
BETA-2-GLYCOPROTEIN I IGM: 3 M Units (ref <20)
Beta-2 Glyco I IgG: 6 G Units (ref <20)
Beta-2-Glycoprotein I IgA: 3 A Units (ref <20)

## 2014-01-29 LAB — PROTEIN S ACTIVITY: PROTEIN S ACTIVITY: 76 % (ref 60–145)

## 2014-01-29 LAB — PROTEIN C ACTIVITY: Protein C Activity: 85 % (ref 74–151)

## 2014-01-31 LAB — LUPUS ANTICOAGULANT PANEL
DRVVT: 36.3 secs (ref <42.9)
Lupus Anticoagulant: NOT DETECTED
PTT Lupus Anticoagulant: 28 secs (ref 28.0–43.0)

## 2014-01-31 LAB — PROTEIN S, TOTAL: PROTEIN S AG TOTAL: 102 % (ref 60–150)

## 2014-01-31 LAB — PROTEIN C, TOTAL: Protein C, Total: 79 % (ref 72–160)

## 2014-02-02 ENCOUNTER — Other Ambulatory Visit (HOSPITAL_COMMUNITY): Payer: Self-pay | Admitting: Hematology & Oncology

## 2014-02-04 ENCOUNTER — Encounter (HOSPITAL_COMMUNITY): Payer: Self-pay

## 2014-02-05 DIAGNOSIS — D509 Iron deficiency anemia, unspecified: Secondary | ICD-10-CM | POA: Insufficient documentation

## 2014-02-05 NOTE — Assessment & Plan Note (Signed)
[name] [name] has pagophagia. She has heavy menstrual cycles which she states have worsened on Xarelto.  She has microcytic anemia. I will check iron studies and recommend IV iron replacement.  I have discussed this with her, including risks and benefits of IV iron therapy.  She does not tolerate iron pills as she has tried them in the past.  Once all labs are available we will calculate her iron deficit and proceed with iron replacement.

## 2014-02-05 NOTE — Assessment & Plan Note (Signed)
39 year old female with a history of multiple miscarriages, saddle PE.  She is currently on Xarelto. I have recommended repeating part of her thrombophilia evaluation. She is agreeable.  She is tolerating Xarelto without any problems. Plan is for her to continue.  I will advise her of her laboratory study results when they become available.

## 2014-02-15 ENCOUNTER — Ambulatory Visit (HOSPITAL_COMMUNITY): Payer: Self-pay

## 2014-02-17 ENCOUNTER — Encounter (HOSPITAL_BASED_OUTPATIENT_CLINIC_OR_DEPARTMENT_OTHER): Payer: Self-pay

## 2014-02-17 ENCOUNTER — Encounter (HOSPITAL_COMMUNITY): Payer: Self-pay | Admitting: Hematology & Oncology

## 2014-02-17 ENCOUNTER — Encounter (HOSPITAL_BASED_OUTPATIENT_CLINIC_OR_DEPARTMENT_OTHER): Payer: Self-pay | Admitting: Hematology & Oncology

## 2014-02-17 VITALS — BP 157/77 | HR 97 | Temp 98.4°F | Resp 20 | Wt 346.9 lb

## 2014-02-17 DIAGNOSIS — I2692 Saddle embolus of pulmonary artery without acute cor pulmonale: Secondary | ICD-10-CM

## 2014-02-17 DIAGNOSIS — D500 Iron deficiency anemia secondary to blood loss (chronic): Secondary | ICD-10-CM

## 2014-02-17 DIAGNOSIS — I2699 Other pulmonary embolism without acute cor pulmonale: Secondary | ICD-10-CM

## 2014-02-17 MED ORDER — FERUMOXYTOL INJECTION 510 MG/17 ML
510.0000 mg | Freq: Once | INTRAVENOUS | Status: AC
  Administered 2014-02-17: 510 mg via INTRAVENOUS
  Filled 2014-02-17: qty 17

## 2014-02-17 MED ORDER — SODIUM CHLORIDE 0.9 % IJ SOLN: 10.0000 mL | INTRAMUSCULAR | Status: DC | PRN

## 2014-02-17 MED ORDER — HEPARIN SOD (PORK) LOCK FLUSH 100 UNIT/ML IV SOLN: 500.0000 [IU] | Freq: Once | INTRAVENOUS | Status: DC | PRN

## 2014-02-17 MED ORDER — SODIUM CHLORIDE 0.9 % IV SOLN
Freq: Once | INTRAVENOUS | Status: AC
  Administered 2014-02-17: 15:00:00 via INTRAVENOUS

## 2014-02-17 NOTE — Patient Instructions (Signed)
[place] at [facility]  Discharge Instructions:  Please call with any concerns prior to your next followup We will check labs about 2 weeks after your iron replacement, we will then notify you if you need additional iron _______________________________________________________________  Thank you for choosing [place] at [facility] to provide your oncology and hematology care.  To afford each patient quality time with our providers, please arrive at least 15 minutes before your scheduled appointment.  You need to re-schedule your appointment if you arrive 10 or more minutes late.  We strive to give you quality time with our providers, and arriving late affects you and other patients whose appointments are after yours.  Also, if you no show three or more times for appointments you may be dismissed from the clinic.  Again, thank you for choosing [place] at [facility] hope is that these requests will allow you access to exceptional care and in a timely manner. _______________________________________________________________  If you have questions after your visit, please contact our office at [phone] between the hours of 8:30 a.m. and 5:00 p.m. Voicemails left after 4:30 p.m. will not be returned until the following business day. _______________________________________________________________  For prescription refill requests, have your pharmacy contact our office. _______________________________________________________________  Recommendations made by the consultant and any test results will be sent to your referring physician. _______________________________________________________________

## 2014-02-17 NOTE — Progress Notes (Signed)
[name] [name], [place] [id number]  Pulmonary embolus,  CT 04/27/2013 saddle embolism IVC filter 04/29/2013 LE Ultrasound  2 miscarriages one at 9 weeks, 6 weeks Iron deficiency Anemia with pagophagia, heavy menses on Xarelto Smokes 2 to 3 cigarettes a day  CURRENT THERAPY:Xarelto  INTERVAL HISTORY: [name] [name] 39 y.o. female returns for follow-up of her history of PE. She also returns for additional discussion of her iron deficiency anemia.  She remains very anemic from heavy cycles which are worsened since being on blood thinners.  She has family with her today.  She denies any new complaints or problems.   MEDICAL HISTORY: Past Medical History  Diagnosis Date  . Closed left ankle fracture     August 30 2012  . Obesity   . Tobacco use disorder   . High blood pressure   . Pulmonary embolism 04/2013  . OSA on CPAP     has PE (pulmonary embolism); Pleuritic chest pain; SOB (shortness of breath); Hyperglycemia; Anemia; Tobacco use disorder; Saddle embolus of pulmonary artery with acute cor pulmonale; Secondary pulmonary hypertension; Obstructive sleep apnea; Pulmonary embolism; Back pain with left-sided radiculopathy; and Iron deficiency anemia due to chronic blood loss on her problem list.      is allergic to sulfa antibiotics.  [name] [name] had no medications administered during this visit.  SURGICAL HISTORY: History reviewed. No pertinent past surgical history.  SOCIAL HISTORY: History   Social History  . Marital Status: Widowed    Spouse Name: N/A  . Number of Children: N/A  . Years of Education: N/A   Occupational History  . Not on file.   Social History Main Topics  . Smoking status: Current Every Day Smoker -- 0.25 packs/day for 18 years    Types: Cigarettes  . Smokeless tobacco: Never Used     Comment: 1 cig per day  . Alcohol Use: Yes  . Drug Use: No  . Sexual Activity: Yes    Birth Control/ Protection: None   Other  Topics Concern  . Not on file   Social History Narrative    FAMILY HISTORY: Family History  Problem Relation Age of Onset  . Hypertension Mother   . Diabetes Paternal Aunt   . Diabetes Paternal Uncle     Review of Systems  Constitutional: Negative.   HENT: Negative.   Eyes: Negative.   Respiratory: Positive for shortness of breath. Negative for cough, hemoptysis, sputum production and wheezing.   Cardiovascular: Negative.   Gastrointestinal: Negative.   Genitourinary: Negative.   Musculoskeletal: Positive for joint pain.  Skin: Negative.   Neurological: Negative.   Endo/Heme/Allergies: Negative.   Psychiatric/Behavioral: Negative.     PHYSICAL EXAMINATION  ECOG PERFORMANCE STATUS: 1 - Symptomatic but completely ambulatory  Filed Vitals:   02/17/14 1321  BP: 157/77  Pulse: 97  Temp: 98.4 F (36.9 C)  Resp: 20    Physical Exam  Constitutional: She is oriented to person, place, and time and well-developed, well-nourished, and in no distress.  Obese  HENT:  Head: Normocephalic and atraumatic.  Nose: Nose normal.  Mouth/Throat: Oropharynx is clear and moist. No oropharyngeal exudate.  Eyes: Conjunctivae and EOM are normal. Pupils are equal, round, and reactive to light. Right eye exhibits no discharge. Left eye exhibits no discharge. No scleral icterus.  Neck: Normal range of motion. Neck supple. No tracheal deviation present. No thyromegaly present.  Cardiovascular: Normal rate, regular rhythm and normal heart sounds.  Exam  reveals no gallop and no friction rub.   No murmur heard. Pulmonary/Chest: Effort normal and breath sounds normal. She has no wheezes. She has no rales.  Abdominal: Soft. Bowel sounds are normal. She exhibits no distension and no mass. There is no tenderness. There is no rebound and no guarding.  Obese  Musculoskeletal: Normal range of motion. She exhibits no edema.  Lymphadenopathy:    She has no cervical adenopathy.  Neurological: She is  alert and oriented to person, place, and time. She has normal reflexes. No cranial nerve deficit. Gait normal. Coordination normal.  Skin: Skin is warm and dry. No rash noted.  Psychiatric: Mood, memory, affect and judgment normal.  Nursing note and vitals reviewed.   LABORATORY DATA:  CBC    Component Value Date/Time   WBC 5.7 01/27/2014 1156   RBC 4.56 01/27/2014 1156   HGB 8.5* 01/27/2014 1156   HCT 31.4* 01/27/2014 1156   PLT 369 01/27/2014 1156   MCV 68.9* 01/27/2014 1156   MCH 18.6* 01/27/2014 1156   MCHC 27.1* 01/27/2014 1156   RDW 19.4* 01/27/2014 1156   LYMPHSABS 0.8 01/27/2014 1156   MONOABS 0.3 01/27/2014 1156   EOSABS 0.1 01/27/2014 1156   BASOSABS 0.0 01/27/2014 1156   CMP     Component Value Date/Time   NA 138 01/27/2014 1156   K 3.7 01/27/2014 1156   CL 106 01/27/2014 1156   CO2 22 01/27/2014 1156   GLUCOSE 83 01/27/2014 1156   BUN 7 01/27/2014 1156   CREATININE 0.79 01/27/2014 1156   CALCIUM 9.5 01/27/2014 1156   PROT 9.3* 01/27/2014 1156   ALBUMIN 4.4 01/27/2014 1156   AST 19 01/27/2014 1156   ALT 14 01/27/2014 1156   ALKPHOS 66 01/27/2014 1156   BILITOT 0.5 01/27/2014 1156   GFRNONAA >90 01/27/2014 1156   GFRAA >90 01/27/2014 1156      RADIOGRAPHIC STUDIES: EXAM: CT ANGIOGRAPHY CHEST WITH CONTRAST  IMPRESSION: Positive for acute PE with CTevidence of right heart strain (RV/LV Ratio = 1.65 ) consistent with at least submassive (intermediate risk) PE. The presence of right heart strain has been associated with an increased risk of morbidity and mortality. Critical Value/emergent results were called by telephone at the time of interpretation on 04/27/2013 at 6:19 PM to [name] [name] , who verbally acknowledged these results.   Electronically Signed  By: Margaree Mackintosh M.D.  On: 04/27/2013 18:20 IMPRESSION: Positive for acute PE with CTevidence of right heart strain (RV/LV Ratio = 1.65 ) consistent with at least submassive  (intermediate risk) PE. The presence of right heart strain has been associated with an increased risk of morbidity and mortality. Critical Value/emergent results were called by telephone at the time of interpretation on 04/27/2013 at 6:19 PM to [name] [name] , who verbally acknowledged these results.   Electronically Signed  By: Margaree Mackintosh M.D.  On: 04/27/2013 18:20    ASSESSMENT and THERAPY PLAN:    Pulmonary embolism 39 year old female with a history of saddle pulmonary embolus. She is currently on Xarelto.. I completed her hypocoagulable evaluation and review the results with her. Her workup thus far is negative. We discussed a referral to [facility] in the future should she choose to decide. Dr. Joan Flores will be able to offer additional insight into her history of recurrent miscarriages, currently with no children and large saddle embolus.   Iron deficiency anemia due to chronic blood loss We discussed her iron deficiency today. She is significantly iron deficient and has severe  anemia. I feel this contributes to her chronic shortness of breath. She and I discussed IV iron replacement which I think would be quite beneficial for her. I have calculated her total iron deficit we discussed the risks and benefits of iron replacement, with the major risk of IV iron replacement being an allergic reaction during infusion. She is very interested in proceeding therefore we will arrange for IV replacement. Recheck her labs 6 weeks after completing her recommended therapy. And I will see her back in 3 months with repeat laboratory studies. Should she need additional iron at the 6 week mark I will arrange that for her as well.     All questions were answered. The patient knows to call the clinic with any problems, questions or concerns. We can certainly see the patient much sooner if necessary.    Molli Hazard 03/05/2014

## 2014-02-22 ENCOUNTER — Ambulatory Visit (HOSPITAL_COMMUNITY): Payer: Self-pay

## 2014-02-24 ENCOUNTER — Encounter (HOSPITAL_COMMUNITY): Payer: Self-pay

## 2014-02-24 ENCOUNTER — Encounter (HOSPITAL_COMMUNITY): Payer: Self-pay | Attending: Hematology & Oncology

## 2014-02-24 ENCOUNTER — Other Ambulatory Visit (HOSPITAL_COMMUNITY): Payer: Self-pay | Admitting: Oncology

## 2014-02-24 DIAGNOSIS — R0602 Shortness of breath: Secondary | ICD-10-CM | POA: Insufficient documentation

## 2014-02-24 DIAGNOSIS — F17210 Nicotine dependence, cigarettes, uncomplicated: Secondary | ICD-10-CM | POA: Insufficient documentation

## 2014-02-24 DIAGNOSIS — Z86711 Personal history of pulmonary embolism: Secondary | ICD-10-CM | POA: Insufficient documentation

## 2014-02-24 DIAGNOSIS — Z7901 Long term (current) use of anticoagulants: Secondary | ICD-10-CM | POA: Insufficient documentation

## 2014-02-24 DIAGNOSIS — N920 Excessive and frequent menstruation with regular cycle: Secondary | ICD-10-CM | POA: Insufficient documentation

## 2014-02-24 DIAGNOSIS — Z9981 Dependence on supplemental oxygen: Secondary | ICD-10-CM | POA: Insufficient documentation

## 2014-02-24 DIAGNOSIS — I2699 Other pulmonary embolism without acute cor pulmonale: Secondary | ICD-10-CM

## 2014-02-24 DIAGNOSIS — I272 Other secondary pulmonary hypertension: Secondary | ICD-10-CM | POA: Insufficient documentation

## 2014-02-24 DIAGNOSIS — R0781 Pleurodynia: Secondary | ICD-10-CM

## 2014-02-24 DIAGNOSIS — D500 Iron deficiency anemia secondary to blood loss (chronic): Secondary | ICD-10-CM

## 2014-02-24 DIAGNOSIS — I2602 Saddle embolus of pulmonary artery with acute cor pulmonale: Secondary | ICD-10-CM

## 2014-02-24 DIAGNOSIS — G4733 Obstructive sleep apnea (adult) (pediatric): Secondary | ICD-10-CM | POA: Insufficient documentation

## 2014-02-24 MED ORDER — SODIUM CHLORIDE 0.9 % IJ SOLN: 3.0000 mL | Freq: Once | INTRAMUSCULAR | Status: DC | PRN

## 2014-02-24 MED ORDER — HEPARIN SOD (PORK) LOCK FLUSH 100 UNIT/ML IV SOLN
500.0000 [IU] | Freq: Once | INTRAVENOUS | Status: DC | PRN
Start: 2014-02-24 — End: 2014-02-24

## 2014-02-24 MED ORDER — SODIUM CHLORIDE 0.9 % IV SOLN
Freq: Once | INTRAVENOUS | Status: AC
  Administered 2014-02-24: 14:00:00 via INTRAVENOUS

## 2014-02-24 MED ORDER — SODIUM CHLORIDE 0.9 % IJ SOLN: 10.0000 mL | INTRAMUSCULAR | Status: DC | PRN

## 2014-02-24 MED ORDER — ALTEPLASE 2 MG IJ SOLR: 2.0000 mg | Freq: Once | INTRAMUSCULAR | Status: DC | PRN

## 2014-02-24 MED ORDER — SODIUM CHLORIDE 0.9 % IV SOLN
510.0000 mg | Freq: Once | INTRAVENOUS | Status: AC
  Administered 2014-02-24: 510 mg via INTRAVENOUS
  Filled 2014-02-24: qty 17

## 2014-02-24 MED ORDER — OXYCODONE HCL 10 MG PO TABS: 10.0000 mg | ORAL_TABLET | ORAL | Status: DC | PRN

## 2014-02-24 MED ORDER — HEPARIN SOD (PORK) LOCK FLUSH 100 UNIT/ML IV SOLN: 250.0000 [IU] | Freq: Once | INTRAVENOUS | Status: DC | PRN

## 2014-02-24 NOTE — Progress Notes (Signed)
[name] [name] Tolerated iron infusion well today.  Discharged ambuatory

## 2014-02-24 NOTE — Patient Instructions (Signed)
[place] at [facility]  Discharge Instructions:  You had an iron infusion today.  Follow up as scheduled.  Please call the clinic if you have any questions or concerns _______________________________________________________________  Thank you for choosing [place] at [facility] to provide your oncology and hematology care.  To afford each patient quality time with our providers, please arrive at least 15 minutes before your scheduled appointment.  You need to re-schedule your appointment if you arrive 10 or more minutes late.  We strive to give you quality time with our providers, and arriving late affects you and other patients whose appointments are after yours.  Also, if you no show three or more times for appointments you may be dismissed from the clinic.  Again, thank you for choosing [place] at [place] hope is that these requests will allow you access to exceptional care and in a timely manner. _______________________________________________________________  If you have questions after your visit, please contact our office at [phone] between the hours of 8:30 a.m. and 5:00 p.m. Voicemails left after 4:30 p.m. will not be returned until the following business day. _______________________________________________________________  For prescription refill requests, have your pharmacy contact our office. _______________________________________________________________  Recommendations made by the consultant and any test results will be sent to your referring physician. _______________________________________________________________

## 2014-03-01 ENCOUNTER — Ambulatory Visit (HOSPITAL_COMMUNITY): Payer: Self-pay

## 2014-03-03 ENCOUNTER — Encounter (HOSPITAL_COMMUNITY): Payer: Self-pay

## 2014-03-03 ENCOUNTER — Encounter (HOSPITAL_COMMUNITY): Payer: Self-pay | Attending: Hematology & Oncology

## 2014-03-03 DIAGNOSIS — D500 Iron deficiency anemia secondary to blood loss (chronic): Secondary | ICD-10-CM

## 2014-03-03 MED ORDER — SODIUM CHLORIDE 0.9 % IV SOLN
Freq: Once | INTRAVENOUS | Status: AC
  Administered 2014-03-03: 14:00:00 via INTRAVENOUS

## 2014-03-03 MED ORDER — SODIUM CHLORIDE 0.9 % IJ SOLN: 10.0000 mL | INTRAMUSCULAR | Status: DC | PRN

## 2014-03-03 MED ORDER — SODIUM CHLORIDE 0.9 % IV SOLN
510.0000 mg | Freq: Once | INTRAVENOUS | Status: AC
  Administered 2014-03-03: 510 mg via INTRAVENOUS
  Filled 2014-03-03: qty 17

## 2014-03-03 NOTE — Patient Instructions (Signed)
[facility] at [facility]  Discharge Instructions:  You had an iron infusion today. Follow up as scheduled Call the clinic if you have any questions or concerns _______________________________________________________________  Thank you for choosing [facility] at [facility] to provide your oncology and hematology care.  To afford each patient quality time with our providers, please arrive at least 15 minutes before your scheduled appointment.  You need to re-schedule your appointment if you arrive 10 or more minutes late.  We strive to give you quality time with our providers, and arriving late affects you and other patients whose appointments are after yours.  Also, if you no show three or more times for appointments you may be dismissed from the clinic.  Again, thank you for choosing [facility] at [facility]. Our hope is that these requests will allow you access to exceptional care and in a timely manner. _______________________________________________________________  If you have questions after your visit, please contact our office at [phone] between the hours of 8:30 a.m. and 5:00 p.m. Voicemails left after 4:30 p.m. will not be returned until the following business day. _______________________________________________________________  For prescription refill requests, have your pharmacy contact our office. _______________________________________________________________  Recommendations made by the consultant and any test results will be sent to your referring physician. _______________________________________________________________ 

## 2014-03-03 NOTE — Progress Notes (Signed)
[name] [name] Tolerated iron infusion well today.  Discharged ambulatory

## 2014-03-05 NOTE — Assessment & Plan Note (Signed)
We discussed her iron deficiency today. She is significantly iron deficient and has severe anemia. I feel this contributes to her chronic shortness of breath. She and I discussed IV iron replacement which I think would be quite beneficial for her. I have calculated her total iron deficit we discussed the risks and benefits of iron replacement, with the major risk of IV iron replacement being an allergic reaction during infusion. She is very interested in proceeding therefore we will arrange for IV replacement. Recheck her labs 6 weeks after completing her recommended therapy. And I will see her back in 3 months with repeat laboratory studies. Should she need additional iron at the 6 week mark I will arrange that for her as well.

## 2014-03-05 NOTE — Assessment & Plan Note (Signed)
39 year old female with a history of saddle pulmonary embolus. She is currently on Xarelto.. I completed her hypocoagulable evaluation and review the results with her. Her workup thus far is negative. We discussed a referral to [facility] in the future should she choose to decide. Dr. Joan Flores will be able to offer additional insight into her history of recurrent miscarriages, currently with no children and large saddle embolus.

## 2014-03-08 ENCOUNTER — Ambulatory Visit (HOSPITAL_COMMUNITY): Payer: Self-pay

## 2014-03-10 ENCOUNTER — Encounter (HOSPITAL_COMMUNITY): Payer: Self-pay | Attending: Hematology & Oncology

## 2014-03-10 DIAGNOSIS — D500 Iron deficiency anemia secondary to blood loss (chronic): Secondary | ICD-10-CM

## 2014-03-10 MED ORDER — SODIUM CHLORIDE 0.9 % IV SOLN
510.0000 mg | Freq: Once | INTRAVENOUS | Status: AC
  Administered 2014-03-10: 510 mg via INTRAVENOUS
  Filled 2014-03-10: qty 17

## 2014-03-10 MED ORDER — SODIUM CHLORIDE 0.9 % IV SOLN
Freq: Once | INTRAVENOUS | Status: AC
  Administered 2014-03-10: 13:00:00 via INTRAVENOUS

## 2014-03-10 MED ORDER — SODIUM CHLORIDE 0.9 % IJ SOLN
10.0000 mL | INTRAMUSCULAR | Status: DC | PRN
  Administered 2014-03-10: 10 mL
  Filled 2014-03-10: qty 10

## 2014-03-10 NOTE — Patient Instructions (Signed)
[place] at [facility] Discharge Instructions  RECOMMENDATIONS MADE BY THE CONSULTANT AND ANY TEST RESULTS WILL BE SENT TO YOUR REFERRING PHYSICIAN.  Iron infusion #4 of 4. Return as scheduled.  Thank you for choosing [place] at [facility] to provide your oncology and hematology care.  To afford each patient quality time with our provider, please arrive at least 15 minutes before your scheduled appointment time.    You need to re-schedule your appointment should you arrive 10 or more minutes late.  We strive to give you quality time with our providers, and arriving late affects you and other patients whose appointments are after yours.  Also, if you no show three or more times for appointments you may be dismissed from the clinic at the providers discretion.     Again, thank you for choosing [facility].  Our hope is that these requests will decrease the amount of time that you wait before being seen by our physicians.       _____________________________________________________________  Should you have questions after your visit to [facility], please contact our office at [phone] between the hours of 8:30 a.m. and 4:30 p.m.  Voicemails left after 4:30 p.m. will not be returned until the following business day.  For prescription refill requests, have your pharmacy contact our office.

## 2014-03-10 NOTE — Progress Notes (Signed)
Tolerated iron infusion well. 

## 2014-03-28 ENCOUNTER — Telehealth (HOSPITAL_COMMUNITY): Payer: Self-pay | Admitting: *Deleted

## 2014-03-28 ENCOUNTER — Other Ambulatory Visit (HOSPITAL_COMMUNITY): Payer: Self-pay | Admitting: Oncology

## 2014-03-28 DIAGNOSIS — I2699 Other pulmonary embolism without acute cor pulmonale: Secondary | ICD-10-CM

## 2014-03-28 DIAGNOSIS — I2602 Saddle embolus of pulmonary artery with acute cor pulmonale: Secondary | ICD-10-CM

## 2014-03-28 DIAGNOSIS — R0781 Pleurodynia: Secondary | ICD-10-CM

## 2014-03-28 MED ORDER — OXYCODONE HCL 10 MG PO TABS: 10.0000 mg | ORAL_TABLET | ORAL | Status: DC | PRN

## 2014-03-28 NOTE — Telephone Encounter (Signed)
Done.  Ready for pick-up  [facility] 03/28/2014 3:38 PM

## 2014-03-31 ENCOUNTER — Encounter (HOSPITAL_COMMUNITY): Payer: Self-pay | Attending: Hematology & Oncology

## 2014-03-31 DIAGNOSIS — Z9981 Dependence on supplemental oxygen: Secondary | ICD-10-CM | POA: Insufficient documentation

## 2014-03-31 DIAGNOSIS — D500 Iron deficiency anemia secondary to blood loss (chronic): Secondary | ICD-10-CM | POA: Insufficient documentation

## 2014-03-31 DIAGNOSIS — Z7901 Long term (current) use of anticoagulants: Secondary | ICD-10-CM | POA: Insufficient documentation

## 2014-03-31 DIAGNOSIS — I272 Other secondary pulmonary hypertension: Secondary | ICD-10-CM | POA: Insufficient documentation

## 2014-03-31 DIAGNOSIS — Z86711 Personal history of pulmonary embolism: Secondary | ICD-10-CM | POA: Insufficient documentation

## 2014-03-31 DIAGNOSIS — F17210 Nicotine dependence, cigarettes, uncomplicated: Secondary | ICD-10-CM | POA: Insufficient documentation

## 2014-03-31 DIAGNOSIS — R0602 Shortness of breath: Secondary | ICD-10-CM | POA: Insufficient documentation

## 2014-03-31 DIAGNOSIS — G4733 Obstructive sleep apnea (adult) (pediatric): Secondary | ICD-10-CM | POA: Insufficient documentation

## 2014-03-31 DIAGNOSIS — N920 Excessive and frequent menstruation with regular cycle: Secondary | ICD-10-CM | POA: Insufficient documentation

## 2014-03-31 LAB — CBC WITH DIFFERENTIAL/PLATELET
BASOS ABS: 0 10*3/uL (ref 0.0–0.1)
Basophils Relative: 0 % (ref 0–1)
EOS ABS: 0.1 10*3/uL (ref 0.0–0.7)
EOS PCT: 2 % (ref 0–5)
HCT: 41.7 % (ref 36.0–46.0)
Hemoglobin: 13.4 g/dL (ref 12.0–15.0)
LYMPHS ABS: 0.6 10*3/uL — AB (ref 0.7–4.0)
LYMPHS PCT: 13 % (ref 12–46)
MCH: 28.7 pg (ref 26.0–34.0)
MCHC: 32.1 g/dL (ref 30.0–36.0)
MCV: 89.3 fL (ref 78.0–100.0)
MONO ABS: 0.4 10*3/uL (ref 0.1–1.0)
Monocytes Relative: 9 % (ref 3–12)
Neutro Abs: 3.6 10*3/uL (ref 1.7–7.7)
Neutrophils Relative %: 76 % (ref 43–77)
PLATELETS: 197 10*3/uL (ref 150–400)
RBC: 4.67 MIL/uL (ref 3.87–5.11)
WBC: 4.7 10*3/uL (ref 4.0–10.5)

## 2014-03-31 LAB — IRON AND TIBC
IRON: 38 ug/dL — AB (ref 42–145)
Saturation Ratios: 13 % — ABNORMAL LOW (ref 20–55)
TIBC: 296 ug/dL (ref 250–470)
UIBC: 258 ug/dL (ref 125–400)

## 2014-03-31 LAB — FERRITIN: FERRITIN: 151 ng/mL (ref 10–291)

## 2014-03-31 NOTE — Progress Notes (Signed)
LABS DRAWN

## 2014-04-29 ENCOUNTER — Other Ambulatory Visit (HOSPITAL_COMMUNITY): Payer: Self-pay | Admitting: Hematology & Oncology

## 2014-04-29 DIAGNOSIS — R0781 Pleurodynia: Secondary | ICD-10-CM

## 2014-04-29 DIAGNOSIS — I2699 Other pulmonary embolism without acute cor pulmonale: Secondary | ICD-10-CM

## 2014-04-29 DIAGNOSIS — I2602 Saddle embolus of pulmonary artery with acute cor pulmonale: Secondary | ICD-10-CM

## 2014-04-29 MED ORDER — OXYCODONE HCL 10 MG PO TABS: 10.0000 mg | ORAL_TABLET | ORAL | Status: DC | PRN

## 2014-05-19 ENCOUNTER — Encounter (HOSPITAL_COMMUNITY): Payer: Self-pay | Admitting: Hematology & Oncology

## 2014-05-19 ENCOUNTER — Ambulatory Visit (HOSPITAL_COMMUNITY): Payer: Self-pay | Admitting: Hematology & Oncology

## 2014-05-19 ENCOUNTER — Encounter (HOSPITAL_COMMUNITY): Payer: Self-pay | Attending: Hematology & Oncology | Admitting: Hematology & Oncology

## 2014-05-19 VITALS — BP 139/79 | HR 90 | Temp 98.7°F | Resp 16 | Wt 335.8 lb

## 2014-05-19 DIAGNOSIS — G4733 Obstructive sleep apnea (adult) (pediatric): Secondary | ICD-10-CM | POA: Insufficient documentation

## 2014-05-19 DIAGNOSIS — Z862 Personal history of diseases of the blood and blood-forming organs and certain disorders involving the immune mechanism: Secondary | ICD-10-CM

## 2014-05-19 DIAGNOSIS — I272 Other secondary pulmonary hypertension: Secondary | ICD-10-CM | POA: Insufficient documentation

## 2014-05-19 DIAGNOSIS — Z86711 Personal history of pulmonary embolism: Secondary | ICD-10-CM | POA: Insufficient documentation

## 2014-05-19 DIAGNOSIS — Z9981 Dependence on supplemental oxygen: Secondary | ICD-10-CM | POA: Insufficient documentation

## 2014-05-19 DIAGNOSIS — R0602 Shortness of breath: Secondary | ICD-10-CM | POA: Insufficient documentation

## 2014-05-19 DIAGNOSIS — D500 Iron deficiency anemia secondary to blood loss (chronic): Secondary | ICD-10-CM | POA: Insufficient documentation

## 2014-05-19 DIAGNOSIS — F17210 Nicotine dependence, cigarettes, uncomplicated: Secondary | ICD-10-CM | POA: Insufficient documentation

## 2014-05-19 DIAGNOSIS — I2602 Saddle embolus of pulmonary artery with acute cor pulmonale: Secondary | ICD-10-CM

## 2014-05-19 DIAGNOSIS — D509 Iron deficiency anemia, unspecified: Secondary | ICD-10-CM

## 2014-05-19 DIAGNOSIS — Z720 Tobacco use: Secondary | ICD-10-CM

## 2014-05-19 DIAGNOSIS — N920 Excessive and frequent menstruation with regular cycle: Secondary | ICD-10-CM | POA: Insufficient documentation

## 2014-05-19 DIAGNOSIS — Z7901 Long term (current) use of anticoagulants: Secondary | ICD-10-CM | POA: Insufficient documentation

## 2014-05-19 LAB — CBC WITH DIFFERENTIAL/PLATELET
BASOS PCT: 0 % (ref 0–1)
Basophils Absolute: 0 10*3/uL (ref 0.0–0.1)
EOS ABS: 0.1 10*3/uL (ref 0.0–0.7)
Eosinophils Relative: 2 % (ref 0–5)
HCT: 42.4 % (ref 36.0–46.0)
HEMOGLOBIN: 13.5 g/dL (ref 12.0–15.0)
LYMPHS ABS: 1.1 10*3/uL (ref 0.7–4.0)
LYMPHS PCT: 23 % (ref 12–46)
MCH: 31.1 pg (ref 26.0–34.0)
MCHC: 31.8 g/dL (ref 30.0–36.0)
MCV: 97.7 fL (ref 78.0–100.0)
MONO ABS: 0.3 10*3/uL (ref 0.1–1.0)
MONOS PCT: 6 % (ref 3–12)
NEUTROS ABS: 3.2 10*3/uL (ref 1.7–7.7)
Neutrophils Relative %: 69 % (ref 43–77)
Platelets: 234 10*3/uL (ref 150–400)
RBC: 4.34 MIL/uL (ref 3.87–5.11)
RDW: 15.3 % (ref 11.5–15.5)
WBC: 4.7 10*3/uL (ref 4.0–10.5)

## 2014-05-19 NOTE — Patient Instructions (Signed)
[place] at [facility] Discharge Instructions  RECOMMENDATIONS MADE BY THE CONSULTANT AND ANY TEST RESULTS WILL BE SENT TO YOUR REFERRING PHYSICIAN.  Exam and discussion by Dr. Whitney Muse. Will check some labs today to see how your iron level is. Will make a referral to Dr. Joan Flores at [facility] - they will contact you with an appointment.  If you haven't heard anything in 3 weeks call.  Mickie Kay, RN  [phone])  Follow-up:  Labs in 2 months and office visit in 4 months.   Thank you for choosing [place] at [facility] to provide your oncology and hematology care.  To afford each patient quality time with our provider, please arrive at least 15 minutes before your scheduled appointment time.    You need to re-schedule your appointment should you arrive 10 or more minutes late.  We strive to give you quality time with our providers, and arriving late affects you and other patients whose appointments are after yours.  Also, if you no show three or more times for appointments you may be dismissed from the clinic at the providers discretion.     Again, thank you for choosing [facility].  Our hope is that these requests will decrease the amount of time that you wait before being seen by our physicians.       _____________________________________________________________  Should you have questions after your visit to [facility], please contact our office at [phone] between the hours of 8:30 a.m. and 4:30 p.m.  Voicemails left after 4:30 p.m. will not be returned until the following business day.  For prescription refill requests, have your pharmacy contact our office.

## 2014-05-19 NOTE — Progress Notes (Signed)
[name] [name], [place] [address]  Pulmonary embolus,  CT 04/27/2013 saddle embolism IVC filter 04/29/2013 LE Ultrasound  2 miscarriages one at 9 weeks, 6 weeks Iron deficiency Anemia with pagophagia, heavy menses on Xarelto Smokes 2 to 3 cigarettes a day  CURRENT THERAPY:Xarelto  INTERVAL HISTORY: [name] [name] 40 y.o. female returns for follow-up of her history of PE. She wears O2 at home, but notes since she has received iron she does not need her O2 nearly as much. She takes her Xarelto every day. She denies any bleeding. She has heavy menstrual cycles.   Reports a dry cough, left leg pain and lower back pain. Wakes up every 1-2 hours each night, attributes this to stress.  MEDICAL HISTORY: Past Medical History  Diagnosis Date  . Closed left ankle fracture     August 30 2012  . Obesity   . Tobacco use disorder   . High blood pressure   . Pulmonary embolism 04/2013  . OSA on CPAP     has PE (pulmonary embolism); Pleuritic chest pain; SOB (shortness of breath); Hyperglycemia; Anemia; Tobacco use disorder; Saddle embolus of pulmonary artery with acute cor pulmonale; Secondary pulmonary hypertension; Obstructive sleep apnea; Pulmonary embolism; Back pain with left-sided radiculopathy; and Iron deficiency anemia due to chronic blood loss on her problem list.     is allergic to sulfa antibiotics.  Ms. Trafton does not currently have medications on file.  SURGICAL HISTORY: History reviewed. No pertinent past surgical history.  SOCIAL HISTORY: History   Social History  . Marital Status: Widowed    Spouse Name: N/A  . Number of Children: N/A  . Years of Education: N/A   Occupational History  . Not on file.   Social History Main Topics  . Smoking status: Current Every Day Smoker -- 0.25 packs/day for 18 years    Types: Cigarettes  . Smokeless tobacco: Never Used     Comment: 1 cig per day  . Alcohol Use: Yes  . Drug Use: No  . Sexual  Activity: Yes    Birth Control/ Protection: None   Other Topics Concern  . Not on file   Social History Narrative    FAMILY HISTORY: Family History  Problem Relation Age of Onset  . Hypertension Mother   . Diabetes Paternal Aunt   . Diabetes Paternal Uncle     Review of Systems  Constitutional: Negative.   HENT: Negative.   Eyes: Negative for blurred vision, double vision, photophobia, pain, discharge and redness.       Wears glasses  Respiratory: Positive for shortness of breath. Negative for cough, hemoptysis, sputum production and wheezing.   Cardiovascular: Negative.   Gastrointestinal: Positive for heartburn. Negative for nausea, vomiting, abdominal pain, diarrhea, constipation, blood in stool and melena.  Genitourinary: Negative.   Musculoskeletal: Positive for back pain and joint pain. Negative for myalgias, falls and neck pain.  Skin: Negative.   Neurological: Negative.   Endo/Heme/Allergies: Negative.   Psychiatric/Behavioral: Negative.     PHYSICAL EXAMINATION  ECOG PERFORMANCE STATUS: 1 - Symptomatic but completely ambulatory  Filed Vitals:   05/19/14 1318  BP: 139/79  Pulse: 90  Temp: 98.7 F (37.1 C)  Resp: 16    Physical Exam  Constitutional: She is oriented to person, place, and time and well-developed, well-nourished, and in no distress.  Obese HENT:  Head: Normocephalic and atraumatic.  Nose: Nose normal.  Mouth/Throat: Oropharynx is clear and moist. No oropharyngeal  exudate.  Eyes: Conjunctivae and EOM are normal. Pupils are equal, round, and reactive to light. Right eye exhibits no discharge. Left eye exhibits no discharge. No scleral icterus.  Neck: Normal range of motion. Neck supple. No tracheal deviation present. No thyromegaly present.  Cardiovascular: Normal rate, regular rhythm and normal heart sounds.  Exam reveals no gallop and no friction rub.   No murmur heard. Pulmonary/Chest: Effort normal and breath sounds normal. She has no  wheezes. She has no rales.  Abdominal: Soft. Bowel sounds are normal. She exhibits no distension and no mass. There is no tenderness. There is no rebound and no guarding.  Musculoskeletal: Normal range of motion. She exhibits no edema.  Lymphadenopathy:    She has no cervical adenopathy.  Neurological: She is alert and oriented to person, place, and time. She has normal reflexes. No cranial nerve deficit. Gait normal. Coordination normal.  Skin: Skin is warm and dry. No rash noted.  Psychiatric: Mood, memory, affect and judgment normal.  Nursing note and vitals reviewed.   LABORATORY DATA:  CBC    Component Value Date/Time   WBC 4.7 05/19/2014 1440   RBC 4.34 05/19/2014 1440   HGB 13.5 05/19/2014 1440   HCT 42.4 05/19/2014 1440   PLT 234 05/19/2014 1440   MCV 97.7 05/19/2014 1440   MCH 31.1 05/19/2014 1440   MCHC 31.8 05/19/2014 1440   RDW 15.3 05/19/2014 1440   LYMPHSABS 1.1 05/19/2014 1440   MONOABS 0.3 05/19/2014 1440   EOSABS 0.1 05/19/2014 1440   BASOSABS 0.0 05/19/2014 1440   CMP     Component Value Date/Time   NA 138 01/27/2014 1156   K 3.7 01/27/2014 1156   CL 106 01/27/2014 1156   CO2 22 01/27/2014 1156   GLUCOSE 83 01/27/2014 1156   BUN 7 01/27/2014 1156   CREATININE 0.79 01/27/2014 1156   CALCIUM 9.5 01/27/2014 1156   PROT 9.3* 01/27/2014 1156   ALBUMIN 4.4 01/27/2014 1156   AST 19 01/27/2014 1156   ALT 14 01/27/2014 1156   ALKPHOS 66 01/27/2014 1156   BILITOT 0.5 01/27/2014 1156   GFRNONAA >90 01/27/2014 1156   GFRAA >90 01/27/2014 1156      RADIOGRAPHIC STUDIES: EXAM: CT ANGIOGRAPHY CHEST WITH CONTRAST  IMPRESSION: Positive for acute PE with CTevidence of right heart strain (RV/LV Ratio = 1.65 ) consistent with at least submassive (intermediate risk) PE. The presence of right heart strain has been associated with an increased risk of morbidity and mortality. Critical Value/emergent results were called by telephone at the time  of interpretation on 04/27/2013 at 6:19 PM to Dr. Milton Ferguson , who verbally acknowledged these results.   Electronically Signed  By: Margaree Mackintosh M.D.  On: 04/27/2013 18:20 IMPRESSION: Positive for acute PE with CTevidence of right heart strain (RV/LV Ratio = 1.65 ) consistent with at least submassive (intermediate risk) PE. The presence of right heart strain has been associated with an increased risk of morbidity and mortality. Critical Value/emergent results were called by telephone at the time of interpretation on 04/27/2013 at 6:19 PM to Dr. Milton Ferguson , who verbally acknowledged these results.   Electronically Signed  By: Margaree Mackintosh M.D.  On: 04/27/2013 18:20    ASSESSMENT and THERAPY PLAN:  Anemia, iron deficiency Large Saddle PE Tobacco Use   Her anemia has completely normalized with iron replacement. I have encouraged her to continue with routine lab monitoring of her anemia and iron levels. Her O2 use has declined and she has  days where she no longer requires O2 since correcting her anemia.  She will return in 4 months. Will monitor labs every two. She will continue on Xarelto.  Orders Placed This Encounter  Procedures  . CBC with Differential    Standing Status: Standing     Number of Occurrences: 10     Standing Expiration Date: 05/19/2015  . Comprehensive metabolic panel    Standing Status: Standing     Number of Occurrences: 10     Standing Expiration Date: 05/19/2015  . Ferritin    Standing Status: Standing     Number of Occurrences: 10     Standing Expiration Date: 05/19/2015  . Iron and TIBC    Standing Status: Standing     Number of Occurrences: 10     Standing Expiration Date: 05/19/2015    All questions were answered. The patient knows to call the clinic with any problems, questions or concerns. We can certainly see the patient much sooner if necessary.   This document serves as a record of services personally performed by  [name], MD. It was created on her behalf by Pearlie Oyster, a trained medical scribe. The creation of this record is based on the scribe's personal observations and the provider's statements to them. This document has been checked and approved by the attending provider.     I have reviewed the above documentation for accuracy and completeness and I agree with the above. Donald Pore MD

## 2014-05-19 NOTE — Progress Notes (Signed)
[name] [name] presented for labwork. Labs per MD order drawn via Peripheral Line 23 gauge needle inserted in left AC  Good blood return present. Procedure without incident.  Needle removed intact. Patient tolerated procedure well.

## 2014-05-20 ENCOUNTER — Other Ambulatory Visit (HOSPITAL_COMMUNITY): Payer: Self-pay | Admitting: Hematology & Oncology

## 2014-05-20 ENCOUNTER — Encounter (HOSPITAL_COMMUNITY): Payer: Self-pay

## 2014-05-20 LAB — FERRITIN: FERRITIN: 22 ng/mL (ref 10–291)

## 2014-05-20 LAB — IRON AND TIBC
Iron: 77 ug/dL (ref 42–145)
Saturation Ratios: 21 % (ref 20–55)
TIBC: 373 ug/dL (ref 250–470)
UIBC: 296 ug/dL (ref 125–400)

## 2014-05-20 LAB — FOLATE: Folate: 8 ng/mL

## 2014-05-20 LAB — VITAMIN B12: Vitamin B-12: 307 pg/mL (ref 211–911)

## 2014-05-23 ENCOUNTER — Encounter (HOSPITAL_COMMUNITY): Payer: Self-pay | Admitting: Lab

## 2014-05-23 NOTE — Progress Notes (Signed)
Referral sent to Dr Joan Flores at [facility].  Records faxed on 4/28

## 2014-05-26 ENCOUNTER — Other Ambulatory Visit (HOSPITAL_COMMUNITY): Payer: Self-pay | Admitting: Oncology

## 2014-05-26 ENCOUNTER — Ambulatory Visit (HOSPITAL_COMMUNITY): Payer: Self-pay

## 2014-05-27 ENCOUNTER — Other Ambulatory Visit (HOSPITAL_COMMUNITY): Payer: Self-pay | Admitting: *Deleted

## 2014-05-27 DIAGNOSIS — I2602 Saddle embolus of pulmonary artery with acute cor pulmonale: Secondary | ICD-10-CM

## 2014-05-27 DIAGNOSIS — I2699 Other pulmonary embolism without acute cor pulmonale: Secondary | ICD-10-CM

## 2014-05-27 DIAGNOSIS — R0781 Pleurodynia: Secondary | ICD-10-CM

## 2014-05-27 MED ORDER — OXYCODONE HCL 10 MG PO TABS: 10.0000 mg | ORAL_TABLET | ORAL | Status: DC | PRN

## 2014-05-30 ENCOUNTER — Ambulatory Visit (HOSPITAL_COMMUNITY): Payer: Self-pay

## 2014-06-08 ENCOUNTER — Ambulatory Visit (HOSPITAL_COMMUNITY): Payer: Self-pay

## 2014-06-14 ENCOUNTER — Encounter (HOSPITAL_COMMUNITY): Payer: Self-pay

## 2014-06-16 ENCOUNTER — Ambulatory Visit: Payer: Self-pay | Admitting: Emergency Medicine

## 2014-06-27 ENCOUNTER — Telehealth (HOSPITAL_COMMUNITY): Payer: Self-pay | Admitting: *Deleted

## 2014-06-27 ENCOUNTER — Other Ambulatory Visit (HOSPITAL_COMMUNITY): Payer: Self-pay | Admitting: Oncology

## 2014-06-27 DIAGNOSIS — R0781 Pleurodynia: Secondary | ICD-10-CM

## 2014-06-27 DIAGNOSIS — I2602 Saddle embolus of pulmonary artery with acute cor pulmonale: Secondary | ICD-10-CM

## 2014-06-27 DIAGNOSIS — I2699 Other pulmonary embolism without acute cor pulmonale: Secondary | ICD-10-CM

## 2014-06-27 MED ORDER — OXYCODONE HCL 10 MG PO TABS: 10.0000 mg | ORAL_TABLET | ORAL | Status: DC | PRN

## 2014-06-29 ENCOUNTER — Ambulatory Visit (INDEPENDENT_AMBULATORY_CARE_PROVIDER_SITE_OTHER): Payer: Self-pay | Admitting: Emergency Medicine

## 2014-06-29 ENCOUNTER — Encounter: Payer: Self-pay | Admitting: Emergency Medicine

## 2014-06-29 VITALS — BP 146/96 | HR 77 | Ht 70.0 in | Wt 344.0 lb

## 2014-06-29 DIAGNOSIS — IMO0002 Reserved for concepts with insufficient information to code with codable children: Secondary | ICD-10-CM

## 2014-06-29 DIAGNOSIS — G4733 Obstructive sleep apnea (adult) (pediatric): Secondary | ICD-10-CM

## 2014-06-29 DIAGNOSIS — I2699 Other pulmonary embolism without acute cor pulmonale: Secondary | ICD-10-CM

## 2014-06-29 DIAGNOSIS — I272 Other secondary pulmonary hypertension: Secondary | ICD-10-CM

## 2014-06-29 NOTE — Assessment & Plan Note (Signed)
I suspected her pulmonary pressures are improved given her overall clinical improvement. I will not check a repeat echocardiogram at this time. I would like to wait until she has been treated for several months with CPAP. Will revisit this issue at her follow-up visit

## 2014-06-29 NOTE — Assessment & Plan Note (Signed)
Given the fact that she has an IVC filter in place I believe that she probably needs to be on anticoagulation for life as long as she tolerates. She is planning to go to [facility] to be evaluated for a possible hypercoagulable state.

## 2014-06-29 NOTE — Progress Notes (Signed)
Subjective:    Patient ID: [name] [name], female    DOB: [birth year], 39 y.o.   MRN: [id number]  HPI 39 yo smoker, HTN, obesity, recent admission for PE with associated PAH and RV dilation. She is being treated with xarelto.  She has a hx of dyspnea even preceding the PE. She is a bit better, but still some with exertion.  She snores, has some witnessed apneas and jerks herself awake. She has L>R LE edema. No cough or wheeze.   ROV 09/10/13 -- 39 yo woman, hx PE in 04/2013, OSA, secondary PAH. PSG > shows moderate OSA that responded to CPAP 9cm H2O. She went to have IVCF extracted but there was clot in the filter so they left it in. It will likely be permanent. She has needed O2 with exertion, breathing has been stable.   ROV 12/21/13 -- follow-up visit for secondary pulmonary hypertension in the setting of pulmonary embolism and obstructive sleep apnea. She has a permanent IVC filter. We ordered CPAP mask, doesn't have it yet - trying to get it through an assistance program but not yet done. She is getting her xarelto free until April. She has a dry cough, bothers her more at night. She has occasional reflux.  We have not repeated her TTE yet.   ROV 06/29/14 -- follow-up visit for history of pulmonary embolism and obstructive sleep apnea resulting in secondary pulmonary hypertension. She has an IVC filter in place, was not removed. She has not yet bee able to start CPAP, she needs to be able to pay for it. She's been doing fairly well. She does still have some occasional exertional dyspnea. She does not have significant chest pain. She does still get some left lower extremity pain especially with exertion. She remains on Xarelto and has tolerated this well   Review of Systems  Constitutional: Negative for fever and unexpected weight change.  HENT: Negative for congestion, dental problem, ear pain, nosebleeds, postnasal drip, rhinorrhea, sinus pressure, sneezing, sore throat and trouble swallowing.     Eyes: Negative for redness and itching.  Respiratory: Positive for cough and shortness of breath. Negative for chest tightness and wheezing.   Cardiovascular: Negative for chest pain, palpitations and leg swelling.       Hand and feet  Gastrointestinal: Negative for nausea, vomiting and abdominal distention.  Genitourinary: Negative for dysuria.  Musculoskeletal: Negative for joint swelling.  Skin: Negative for rash.  Hematological: Does not bruise/bleed easily.  Psychiatric/Behavioral: Negative for dysphoric mood. The patient is not nervous/anxious.       Objective:   Physical Exam Filed Vitals:   06/29/14 1545  BP: 146/96  Pulse: 77  Height: 5\' 10"  (1.778 m)  Weight: 344 lb (156.037 kg)  SpO2: 99%   Gen: Pleasant, obese woman, in no distress,  normal affect  ENT: No lesions,  mouth clear,  oropharynx clear, no postnasal drip  Neck: No JVD, no TMG, no carotid bruits  Lungs: No use of accessory muscles, clear without rales or rhonchi  Cardiovascular: RRR, heart sounds normal, no murmur or gallops, no peripheral edema  Musculoskeletal: No deformities, no cyanosis or clubbing, no calf tenderness  Neuro: alert, non focal  Skin: Warm, no lesions or rashes    04/27/13 --  COMPARISON: None.  FINDINGS:  A large saddle embolus is appreciated. Extending from the distal  main pulmonary artery and to the left and right pulmonary arteries  with extension into the left upper lobe, left lower lobe and  lingula  branches. There is right upper lobe, right middle lobe and right  lower lobe extension. Areas of subsegmental extension appreciated.  RV LV ratio 1.65. There are areas of intraventricular septal bowing  to the right.  The thoracic inlet is unremarkable.  There is no evidence of mediastinal masses or adenopathy.  The lungs are clear.  Visualized upper abdominal viscera are unremarkable.  Review of the MIP images confirms the above findings.  IMPRESSION:  Positive for  acute PE with CTevidence of right heart strain (RV/LV  Ratio = 1.65 ) consistent with at least submassive (intermediate  risk) PE. The presence of right heart strain has been associated  with an increased risk of morbidity and mortality.    04/28/13 --  Study Conclusions - Study data: Technically difficult study. - Procedure narrative: Transthoracic echocardiography. Image quality was adequate. The study was technically difficult, as a result of body habitus. - Left ventricle: The cavity size was normal. Wall thickness was increased in a pattern of moderate LVH. Systolic function was vigorous. The estimated ejection fraction was in the range of 65% to 70%. Wall motion was normal; there were no regional wall motion abnormalities. Doppler parameters are consistent with abnormal left ventricular relaxation (grade 1 diastolic dysfunction). - Aortic valve: Valve area: 2.01cm^2(VTI). Valve area: 2.11cm^2 (Vmax). - Right ventricle: There is systolic flattening of the interventricular septum consistent with RV pressure overload. The cavity size was severely dilated. Systolic function was mildly reduced. - Pulmonary arteries: Systolic pressure was moderately increased. PA peak pressure: 24mm Hg (S).      Assessment & Plan:  Obstructive sleep apnea Not yet treated. She did get sleep study and needs 9cm H2O water. She still is working to get a refurbished CPAP mask. She will let us know when she is able to do so and then we will set up the pressures appropriately.   Pulmonary embolism Given the fact that she has an IVC filter in place I believe that she probably needs to be on anticoagulation for life as long as she tolerates. She is planning to go to [facility] to be evaluated for a possible hypercoagulable state.   Secondary pulmonary hypertension I suspected her pulmonary pressures are improved given her overall clinical improvement. I will not check a repeat echocardiogram at this time.  I would like to wait until she has been treated for several months with CPAP. Will revisit this issue at her follow-up visit

## 2014-06-29 NOTE — Patient Instructions (Signed)
Please continue your Xarelto as you are taking it Please contact our office when you believe we will be able to start CPAP so that we can order the correct pressures Follow-up in [facility] as planned We will repeat your echocardiogram several months after you are able to start CPAP to assess your pulmonary artery pressures Follow with Dr Lamonte Sakai in 6 months or sooner if you have any problems

## 2014-06-29 NOTE — Assessment & Plan Note (Signed)
Not yet treated. She did get sleep study and needs 9cm H2O water. She still is working to get a refurbished CPAP mask. She will let us know when she is able to do so and then we will set up the pressures appropriately.

## 2014-06-30 ENCOUNTER — Emergency Department (HOSPITAL_COMMUNITY)
Admission: EM | Admit: 2014-06-30 | Discharge: 2014-06-30 | Disposition: A | Discharge status: Home / Self Care | Payer: Medicaid Other | Attending: Emergency Medicine | Admitting: Emergency Medicine

## 2014-06-30 ENCOUNTER — Encounter (HOSPITAL_COMMUNITY): Payer: Self-pay | Admitting: Emergency Medicine

## 2014-06-30 DIAGNOSIS — Z3A01 Less than 8 weeks gestation of pregnancy: Secondary | ICD-10-CM | POA: Diagnosis not present

## 2014-06-30 DIAGNOSIS — Z9981 Dependence on supplemental oxygen: Secondary | ICD-10-CM | POA: Insufficient documentation

## 2014-06-30 DIAGNOSIS — O99281 Endocrine, nutritional and metabolic diseases complicating pregnancy, first trimester: Secondary | ICD-10-CM | POA: Diagnosis not present

## 2014-06-30 DIAGNOSIS — Z8781 Personal history of (healed) traumatic fracture: Secondary | ICD-10-CM | POA: Insufficient documentation

## 2014-06-30 DIAGNOSIS — Z79899 Other long term (current) drug therapy: Secondary | ICD-10-CM | POA: Diagnosis not present

## 2014-06-30 DIAGNOSIS — E669 Obesity, unspecified: Secondary | ICD-10-CM | POA: Diagnosis not present

## 2014-06-30 DIAGNOSIS — Z720 Tobacco use: Secondary | ICD-10-CM | POA: Diagnosis not present

## 2014-06-30 DIAGNOSIS — O210 Mild hyperemesis gravidarum: Secondary | ICD-10-CM | POA: Diagnosis not present

## 2014-06-30 DIAGNOSIS — Z86711 Personal history of pulmonary embolism: Secondary | ICD-10-CM | POA: Insufficient documentation

## 2014-06-30 DIAGNOSIS — G4733 Obstructive sleep apnea (adult) (pediatric): Secondary | ICD-10-CM | POA: Insufficient documentation

## 2014-06-30 DIAGNOSIS — Z3490 Encounter for supervision of normal pregnancy, unspecified, unspecified trimester: Secondary | ICD-10-CM

## 2014-06-30 DIAGNOSIS — Z7901 Long term (current) use of anticoagulants: Secondary | ICD-10-CM | POA: Insufficient documentation

## 2014-06-30 DIAGNOSIS — R112 Nausea with vomiting, unspecified: Secondary | ICD-10-CM

## 2014-06-30 DIAGNOSIS — O99351 Diseases of the nervous system complicating pregnancy, first trimester: Secondary | ICD-10-CM | POA: Insufficient documentation

## 2014-06-30 LAB — URINALYSIS, ROUTINE W REFLEX MICROSCOPIC
Bilirubin Urine: NEGATIVE
GLUCOSE, UA: NEGATIVE mg/dL
HGB URINE DIPSTICK: NEGATIVE
KETONES UR: NEGATIVE mg/dL
LEUKOCYTES UA: NEGATIVE
NITRITE: NEGATIVE
PH: 5.5 (ref 5.0–8.0)
Protein, ur: NEGATIVE mg/dL
SPECIFIC GRAVITY, URINE: 1.025 (ref 1.005–1.030)
Urobilinogen, UA: 0.2 mg/dL (ref 0.0–1.0)

## 2014-06-30 LAB — COMPREHENSIVE METABOLIC PANEL
ALK PHOS: 41 U/L (ref 38–126)
ALT: 13 U/L — ABNORMAL LOW (ref 14–54)
AST: 14 U/L — ABNORMAL LOW (ref 15–41)
Albumin: 3.4 g/dL — ABNORMAL LOW (ref 3.5–5.0)
Anion gap: 7 (ref 5–15)
BILIRUBIN TOTAL: 0.5 mg/dL (ref 0.3–1.2)
BUN: 20 mg/dL (ref 6–20)
CO2: 25 mmol/L (ref 22–32)
Calcium: 8.6 mg/dL — ABNORMAL LOW (ref 8.9–10.3)
Chloride: 102 mmol/L (ref 101–111)
Creatinine, Ser: 0.87 mg/dL (ref 0.44–1.00)
GFR calc non Af Amer: >60 mL/min (ref >60)
Glucose, Bld: 92 mg/dL (ref 65–99)
Potassium: 4 mmol/L (ref 3.5–5.1)
Sodium: 134 mmol/L — ABNORMAL LOW (ref 135–145)
TOTAL PROTEIN: 7.4 g/dL (ref 6.5–8.1)

## 2014-06-30 LAB — CBC WITH DIFFERENTIAL/PLATELET
BASOS PCT: 0 % (ref 0–1)
Basophils Absolute: 0 10*3/uL (ref 0.0–0.1)
EOS ABS: 0.2 10*3/uL (ref 0.0–0.7)
Eosinophils Relative: 3 % (ref 0–5)
HEMATOCRIT: 37.1 % (ref 36.0–46.0)
HEMOGLOBIN: 12.1 g/dL (ref 12.0–15.0)
Lymphocytes Relative: 20 % (ref 12–46)
Lymphs Abs: 1.2 10*3/uL (ref 0.7–4.0)
MCH: 31.2 pg (ref 26.0–34.0)
MCHC: 32.6 g/dL (ref 30.0–36.0)
MCV: 95.6 fL (ref 78.0–100.0)
MONOS PCT: 8 % (ref 3–12)
Monocytes Absolute: 0.5 10*3/uL (ref 0.1–1.0)
Neutro Abs: 4.3 10*3/uL (ref 1.7–7.7)
Neutrophils Relative %: 69 % (ref 43–77)
PLATELETS: 242 10*3/uL (ref 150–400)
RBC: 3.88 MIL/uL (ref 3.87–5.11)
RDW: 14.5 % (ref 11.5–15.5)
WBC: 6.2 10*3/uL (ref 4.0–10.5)

## 2014-06-30 LAB — POC URINE PREG, ED: Preg Test, Ur: POSITIVE — AB

## 2014-06-30 LAB — HCG, QUANTITATIVE, PREGNANCY: hCG, Beta Chain, Quant, S: 3014 m[IU]/mL — ABNORMAL HIGH (ref <5)

## 2014-06-30 MED ORDER — PRENATAL COMPLETE 14-0.4 MG PO TABS: 1.0000 | ORAL_TABLET | Freq: Every day | ORAL | Status: DC

## 2014-06-30 MED ORDER — ONDANSETRON 8 MG PO TBDP: 8.0000 mg | ORAL_TABLET | Freq: Three times a day (TID) | ORAL | Status: DC | PRN

## 2014-06-30 NOTE — ED Notes (Signed)
Pt reports abdominal pain with emesis x 3-4 days.

## 2014-06-30 NOTE — Discharge Instructions (Signed)
First Trimester of Pregnancy The first trimester of pregnancy is from week 1 until the end of week 12 (months 1 through 3). A week after a sperm fertilizes an egg, the egg will implant on the wall of the uterus. This embryo will begin to develop into a baby. Genes from you and your partner are forming the baby. The female genes determine whether the baby is a boy or a girl. At 6-8 weeks, the eyes and face are formed, and the heartbeat can be seen on ultrasound. At the end of 12 weeks, all the baby's organs are formed.  Now that you are pregnant, you will want to do everything you can to have a healthy baby. Two of the most important things are to get good prenatal care and to follow your health care provider's instructions. Prenatal care is all the medical care you receive before the baby's birth. This care will help prevent, find, and treat any problems during the pregnancy and childbirth. BODY CHANGES Your body goes through many changes during pregnancy. The changes vary from woman to woman.   You may gain or lose a couple of pounds at first.  You may feel sick to your stomach (nauseous) and throw up (vomit). If the vomiting is uncontrollable, call your health care provider.  You may tire easily.  You may develop headaches that can be relieved by medicines approved by your health care provider.  You may urinate more often. Painful urination may mean you have a bladder infection.  You may develop heartburn as a result of your pregnancy.  You may develop constipation because certain hormones are causing the muscles that push waste through your intestines to slow down.  You may develop hemorrhoids or swollen, bulging veins (varicose veins).  Your breasts may begin to grow larger and become tender. Your nipples may stick out more, and the tissue that surrounds them (areola) may become darker.  Your gums may bleed and may be sensitive to brushing and flossing.  Dark spots or blotches (chloasma,  mask of pregnancy) may develop on your face. This will likely fade after the baby is born.  Your menstrual periods will stop.  You may have a loss of appetite.  You may develop cravings for certain kinds of food.  You may have changes in your emotions from day to day, such as being excited to be pregnant or being concerned that something may go wrong with the pregnancy and baby.  You may have more vivid and strange dreams.  You may have changes in your hair. These can include thickening of your hair, rapid growth, and changes in texture. Some women also have hair loss during or after pregnancy, or hair that feels dry or thin. Your hair will most likely return to normal after your baby is born. WHAT TO EXPECT AT YOUR PRENATAL VISITS During a routine prenatal visit:  You will be weighed to make sure you and the baby are growing normally.  Your blood pressure will be taken.  Your abdomen will be measured to track your baby's growth.  The fetal heartbeat will be listened to starting around week 10 or 12 of your pregnancy.  Test results from any previous visits will be discussed. Your health care provider may ask you:  How you are feeling.  If you are feeling the baby move.  If you have had any abnormal symptoms, such as leaking fluid, bleeding, severe headaches, or abdominal cramping.  If you have any questions. Other tests   that may be performed during your first trimester include:  Blood tests to find your blood type and to check for the presence of any previous infections. They will also be used to check for low iron levels (anemia) and Rh antibodies. Later in the pregnancy, blood tests for diabetes will be done along with other tests if problems develop.  Urine tests to check for infections, diabetes, or protein in the urine.  An ultrasound to confirm the proper growth and development of the baby.  An amniocentesis to check for possible genetic problems.  Fetal screens for  spina bifida and Down syndrome.  You may need other tests to make sure you and the baby are doing well. HOME CARE INSTRUCTIONS  Medicines  Follow your health care provider's instructions regarding medicine use. Specific medicines may be either safe or unsafe to take during pregnancy.  Take your prenatal vitamins as directed.  If you develop constipation, try taking a stool softener if your health care provider approves. Diet  Eat regular, well-balanced meals. Choose a variety of foods, such as meat or vegetable-based protein, fish, milk and low-fat dairy products, vegetables, fruits, and whole grain breads and cereals. Your health care provider will help you determine the amount of weight gain that is right for you.  Avoid raw meat and uncooked cheese. These carry germs that can cause birth defects in the baby.  Eating four or five small meals rather than three large meals a day may help relieve nausea and vomiting. If you start to feel nauseous, eating a few soda crackers can be helpful. Drinking liquids between meals instead of during meals also seems to help nausea and vomiting.  If you develop constipation, eat more high-fiber foods, such as fresh vegetables or fruit and whole grains. Drink enough fluids to keep your urine clear or pale yellow. Activity and Exercise  Exercise only as directed by your health care provider. Exercising will help you:  Control your weight.  Stay in shape.  Be prepared for labor and delivery.  Experiencing pain or cramping in the lower abdomen or low back is a good sign that you should stop exercising. Check with your health care provider before continuing normal exercises.  Try to avoid standing for long periods of time. Move your legs often if you must stand in one place for a long time.  Avoid heavy lifting.  Wear low-heeled shoes, and practice good posture.  You may continue to have sex unless your health care provider directs you  otherwise. Relief of Pain or Discomfort  Wear a good support bra for breast tenderness.   Take warm sitz baths to soothe any pain or discomfort caused by hemorrhoids. Use hemorrhoid cream if your health care provider approves.   Rest with your legs elevated if you have leg cramps or low back pain.  If you develop varicose veins in your legs, wear support hose. Elevate your feet for 15 minutes, 3-4 times a day. Limit salt in your diet. Prenatal Care  Schedule your prenatal visits by the twelfth week of pregnancy. They are usually scheduled monthly at first, then more often in the last 2 months before delivery.  Write down your questions. Take them to your prenatal visits.  Keep all your prenatal visits as directed by your health care provider. Safety  Wear your seat belt at all times when driving.  Make a list of emergency phone numbers, including numbers for family, friends, the hospital, and police and fire departments. General Tips    Ask your health care provider for a referral to a local prenatal education class. Begin classes no later than at the beginning of month 6 of your pregnancy.  Ask for help if you have counseling or nutritional needs during pregnancy. Your health care provider can offer advice or refer you to specialists for help with various needs.  Do not use hot tubs, steam rooms, or saunas.  Do not douche or use tampons or scented sanitary pads.  Do not cross your legs for long periods of time.  Avoid cat litter boxes and soil used by cats. These carry germs that can cause birth defects in the baby and possibly loss of the fetus by miscarriage or stillbirth.  Avoid all smoking, herbs, alcohol, and medicines not prescribed by your health care provider. Chemicals in these affect the formation and growth of the baby.  Schedule a dentist appointment. At home, brush your teeth with a soft toothbrush and be gentle when you floss. SEEK MEDICAL CARE IF:   You have  dizziness.  You have mild pelvic cramps, pelvic pressure, or nagging pain in the abdominal area.  You have persistent nausea, vomiting, or diarrhea.  You have a bad smelling vaginal discharge.  You have pain with urination.  You notice increased swelling in your face, hands, legs, or ankles. SEEK IMMEDIATE MEDICAL CARE IF:   You have a fever.  You are leaking fluid from your vagina.  You have spotting or bleeding from your vagina.  You have severe abdominal cramping or pain.  You have rapid weight gain or loss.  You vomit blood or material that looks like coffee grounds.  You are exposed to German measles and have never had them.  You are exposed to fifth disease or chickenpox.  You develop a severe headache.  You have shortness of breath.  You have any kind of trauma, such as from a fall or a car accident. Document Released: 01/01/2001 Document Revised: 05/24/2013 Document Reviewed: 11/17/2012 Exit[employer] Patient Information 2015 [employer]. This information is not intended to replace advice given to you by your health care provider. Make sure you discuss any questions you have with your health care provider.  

## 2014-06-30 NOTE — ED Notes (Signed)
Pt given po fluids at this time.  Denies current nausea.

## 2014-07-02 NOTE — ED Provider Notes (Signed)
CSN: [id number]     Arrival date & time 06/30/14  3810 History   First MD Initiated Contact with Patient 06/30/14 [phone]     Chief Complaint  Patient presents with  . Emesis     (Consider location/radiation/quality/duration/timing/severity/associated sxs/prior Treatment) The history is provided by the patient.   [name] [name] is a 39 y.o. female presenting with persistent nausea and mostly morning vomiting for the past 3-4 days.  She reports low suprapubic pressure sensation but denies dysuria, frequent urination, back pain, fevers, chills, vaginal bleeding or discharge and no diarrhea. She has had no exposures to ill individuals.  She has found no alleviators for her symptoms.  Her LMP was 05/23/14 and normal.     Past Medical History  Diagnosis Date  . Closed left ankle fracture     August 30 2012  . Obesity   . Tobacco use disorder   . High blood pressure   . Pulmonary embolism 04/2013  . OSA on CPAP    History reviewed. No pertinent past surgical history. Family History  Problem Relation Age of Onset  . Hypertension Mother   . Diabetes Paternal Aunt   . Diabetes Paternal Uncle    History  Substance Use Topics  . Smoking status: Current Every Day Smoker -- 0.25 packs/day for 18 years    Types: Cigarettes  . Smokeless tobacco: Never Used     Comment: 1 cig per day  . Alcohol Use: Yes   OB History    No data available     Review of Systems  Constitutional: Negative for fever and chills.  HENT: Negative for congestion and sore throat.   Eyes: Negative.   Respiratory: Negative for chest tightness and shortness of breath.   Cardiovascular: Negative for chest pain.  Gastrointestinal: Positive for nausea and vomiting. Negative for abdominal pain.  Genitourinary: Negative.  Negative for dysuria, urgency, frequency, hematuria, flank pain, vaginal bleeding, vaginal discharge, vaginal pain and pelvic pain.  Musculoskeletal: Negative for joint swelling, arthralgias and neck  pain.  Skin: Negative.  Negative for rash and wound.  Neurological: Negative for dizziness, weakness, light-headedness, numbness and headaches.  Psychiatric/Behavioral: Negative.       Allergies  Sulfa antibiotics  Home Medications   Prior to Admission medications   Medication Sig Start Date End Date Taking? Authorizing Provider  amLODipine (NORVASC) 2.5 MG tablet Take 2.5 mg by mouth daily.    Yes Historical Provider, MD  lisinopril (PRINIVIL,ZESTRIL) 20 MG tablet Take 20 mg by mouth daily.    Yes Historical Provider, MD  Oxycodone HCl 10 MG TABS Take 1 tablet (10 mg total) by mouth every 4 (four) hours as needed. 06/27/14  Yes Baird Cancer, PA-C  Rivaroxaban (XARELTO) 20 MG TABS tablet Take 1 tablet (20 mg total) by mouth daily with supper. 05/20/13  Yes Janece Canterbury, MD  ondansetron (ZOFRAN ODT) 8 MG disintegrating tablet Take 1 tablet (8 mg total) by mouth every 8 (eight) hours as needed for nausea or vomiting. 06/30/14   Evalee Jefferson, PA-C  Prenatal Vit-Fe Fumarate-FA (PRENATAL COMPLETE) 14-0.4 MG TABS Take 1 tablet by mouth daily. 06/30/14   Evalee Jefferson, PA-C   BP 157/82 mmHg  Pulse 73  Temp(Src) 97.5 F (36.4 C)  Resp 20  Ht 5\' 10"  (1.778 m)  Wt 343 lb (155.584 kg)  BMI 49.22 kg/m2  SpO2 100%  LMP 05/23/2014 Physical Exam  Constitutional: She appears well-developed and well-nourished.  HENT:  Head: Normocephalic and atraumatic.  Eyes:  Conjunctivae are normal.  Neck: Normal range of motion.  Cardiovascular: Normal rate, regular rhythm, normal heart sounds and intact distal pulses.   Pulmonary/Chest: Effort normal and breath sounds normal. She has no wheezes.  Abdominal: Soft. Bowel sounds are normal. There is no tenderness. There is no rebound and no guarding.  Genitourinary:  Deferred.  Musculoskeletal: Normal range of motion.  Neurological: She is alert.  Skin: Skin is warm and dry.  Psychiatric: She has a normal mood and affect.  Nursing note and vitals  reviewed.   ED Course  Procedures (including critical care time) Labs Review Labs Reviewed  COMPREHENSIVE METABOLIC PANEL - Abnormal; Notable for the following:    Sodium 134 (*)    Calcium 8.6 (*)    Albumin 3.4 (*)    AST 14 (*)    ALT 13 (*)    All other components within normal limits  HCG, QUANTITATIVE, PREGNANCY - Abnormal; Notable for the following:    hCG, Beta Chain, Quant, S 3014 (*)    All other components within normal limits  POC URINE PREG, ED - Abnormal; Notable for the following:    Preg Test, Ur POSITIVE (*)    All other components within normal limits  CBC WITH DIFFERENTIAL/PLATELET  URINALYSIS, ROUTINE W REFLEX MICROSCOPIC (NOT AT [facility])    Imaging Review No results found.   EKG Interpretation None      MDM   Final diagnoses:  Pregnancy  Non-intractable vomiting with nausea, vomiting of unspecified type    Patients labs and/or radiological studies were reviewed and considered during the medical decision making and disposition process.  Results were also discussed with patient. LMP and hcg c/w 5 w 3 day gestation. Pt has given referral for ob/gyn prenatal care, placed on prenatal vitamin. Prescribed zofran prn nausea.   The patient appears reasonably screened and/or stabilized for discharge and I doubt any other medical condition or other [facility] requiring further screening, evaluation, or treatment in the ED at this time prior to discharge.     Evalee Jefferson, PA-C 07/02/14 2101  Nat Christen, MD 07/03/14 [phone]

## 2014-07-05 ENCOUNTER — Other Ambulatory Visit (HOSPITAL_COMMUNITY): Payer: Self-pay | Admitting: Hematology & Oncology

## 2014-07-05 DIAGNOSIS — I2602 Saddle embolus of pulmonary artery with acute cor pulmonale: Secondary | ICD-10-CM

## 2014-07-05 DIAGNOSIS — Z3490 Encounter for supervision of normal pregnancy, unspecified, unspecified trimester: Secondary | ICD-10-CM

## 2014-07-05 DIAGNOSIS — I2699 Other pulmonary embolism without acute cor pulmonale: Secondary | ICD-10-CM

## 2014-07-05 MED ORDER — ENOXAPARIN SODIUM 150 MG/ML ~~LOC~~ SOLN: 1.0000 mg/kg | Freq: Two times a day (BID) | SUBCUTANEOUS | Status: DC

## 2014-07-12 ENCOUNTER — Ambulatory Visit (INDEPENDENT_AMBULATORY_CARE_PROVIDER_SITE_OTHER): Payer: Medicaid Other | Admitting: Obstetrics & Gynecology

## 2014-07-12 ENCOUNTER — Encounter: Payer: Self-pay | Admitting: Obstetrics & Gynecology

## 2014-07-12 VITALS — BP 128/60 | HR 76 | Ht 70.0 in | Wt 351.6 lb

## 2014-07-12 DIAGNOSIS — O09891 Supervision of other high risk pregnancies, first trimester: Secondary | ICD-10-CM

## 2014-07-12 DIAGNOSIS — O09522 Supervision of elderly multigravida, second trimester: Secondary | ICD-10-CM | POA: Diagnosis not present

## 2014-07-12 DIAGNOSIS — O88211 Thromboembolism in pregnancy, first trimester: Secondary | ICD-10-CM

## 2014-07-12 DIAGNOSIS — O0991 Supervision of high risk pregnancy, unspecified, first trimester: Secondary | ICD-10-CM

## 2014-07-12 DIAGNOSIS — I10 Essential (primary) hypertension: Secondary | ICD-10-CM

## 2014-07-12 DIAGNOSIS — I2699 Other pulmonary embolism without acute cor pulmonale: Secondary | ICD-10-CM | POA: Diagnosis not present

## 2014-07-12 NOTE — Progress Notes (Signed)
Patient ID: [name] [name], female   DOB: 08/18/1975, 39 y.o.   MRN: [id number] Chief Complaint  Patient presents with  . Referral    discuss medication-XARELTO.    Blood pressure 128/60, pulse 76, height 5\' 10"  (1.778 m), weight 351 lb 9.6 oz (159.485 kg), last menstrual period 05/23/2014.  Stop xarelto, begin lovenox as ordered  Amlodipine ok, may make the switch to labetalol but no rush  Stop the lisinopril  Sonogram [redacted] week along with initial ob visit  Chief Complaint  Patient presents with  . Referral    discuss medication-XARELTO.    Blood pressure 128/60, pulse 76, height 5\' 10"  (1.778 m), weight 351 lb 9.6 oz (159.485 kg), last menstrual period 05/23/2014.  39 y.o. G4P0030 Patient's last menstrual period was 05/23/2014.   Subjective Pt early pregnant with a recently diagnosed saddle pulmonary embolus with no etiology noted  Objective   Pertinent ROS   Labs or studies Reviewed chart epic  Impression Diagnoses this Encounter::   ICD-9-CM ICD-10-CM   1. Pulmonary embolism affecting pregnancy in first trimester 673.23 O88.211   2. High-risk pregnancy, first trimester V23.9 O09.91 US OB Transvaginal    Established relevant diagnosis(es):   Plan/Recommendations No orders of the defined types were placed in this encounter.  xarelto started as above   Follow up 1week      Face to face time:  15 minutes  Greater than 50% of the visit time was spent in counseling and coordination of care with the patient.  The summary and outline of the counseling and care coordination is summarized in the note above.   All questions were answered.

## 2014-07-15 ENCOUNTER — Ambulatory Visit (HOSPITAL_COMMUNITY)
Admission: RE | Admit: 2014-07-15 | Discharge: 2014-07-15 | Disposition: A | Discharge status: Home / Self Care | Payer: Self-pay | Source: Ambulatory Visit | Attending: Obstetrics & Gynecology | Admitting: Obstetrics & Gynecology

## 2014-07-15 ENCOUNTER — Other Ambulatory Visit: Payer: Self-pay | Admitting: Obstetrics & Gynecology

## 2014-07-15 DIAGNOSIS — O0991 Supervision of high risk pregnancy, unspecified, first trimester: Secondary | ICD-10-CM

## 2014-07-19 ENCOUNTER — Encounter (HOSPITAL_COMMUNITY): Payer: Medicaid Other | Attending: Hematology & Oncology

## 2014-07-19 ENCOUNTER — Encounter (HOSPITAL_COMMUNITY): Payer: Self-pay | Admitting: Oncology

## 2014-07-19 DIAGNOSIS — D509 Iron deficiency anemia, unspecified: Secondary | ICD-10-CM | POA: Insufficient documentation

## 2014-07-19 DIAGNOSIS — I2602 Saddle embolus of pulmonary artery with acute cor pulmonale: Secondary | ICD-10-CM | POA: Diagnosis present

## 2014-07-19 LAB — CBC WITH DIFFERENTIAL/PLATELET
Basophils Absolute: 0 10*3/uL (ref 0.0–0.1)
Basophils Relative: 0 % (ref 0–1)
Eosinophils Absolute: 0.1 10*3/uL (ref 0.0–0.7)
Eosinophils Relative: 2 % (ref 0–5)
HCT: 36.4 % (ref 36.0–46.0)
HEMOGLOBIN: 11.9 g/dL — AB (ref 12.0–15.0)
LYMPHS ABS: 1.1 10*3/uL (ref 0.7–4.0)
Lymphocytes Relative: 30 % (ref 12–46)
MCH: 30.4 pg (ref 26.0–34.0)
MCHC: 32.7 g/dL (ref 30.0–36.0)
MCV: 92.9 fL (ref 78.0–100.0)
Monocytes Absolute: 0.4 10*3/uL (ref 0.1–1.0)
Monocytes Relative: 11 % (ref 3–12)
NEUTROS ABS: 2.1 10*3/uL (ref 1.7–7.7)
NEUTROS PCT: 57 % (ref 43–77)
Platelets: 203 10*3/uL (ref 150–400)
RBC: 3.92 MIL/uL (ref 3.87–5.11)
RDW: 14.5 % (ref 11.5–15.5)
WBC: 3.7 10*3/uL — ABNORMAL LOW (ref 4.0–10.5)

## 2014-07-19 LAB — COMPREHENSIVE METABOLIC PANEL
ALBUMIN: 3.5 g/dL (ref 3.5–5.0)
ALT: 88 U/L — ABNORMAL HIGH (ref 14–54)
AST: 57 U/L — AB (ref 15–41)
Alkaline Phosphatase: 40 U/L (ref 38–126)
Anion gap: 7 (ref 5–15)
BUN: 12 mg/dL (ref 6–20)
CO2: 26 mmol/L (ref 22–32)
Calcium: 9.1 mg/dL (ref 8.9–10.3)
Chloride: 104 mmol/L (ref 101–111)
Creatinine, Ser: 0.7 mg/dL (ref 0.44–1.00)
GFR calc Af Amer: >60 mL/min (ref >60)
GLUCOSE: 88 mg/dL (ref 65–99)
POTASSIUM: 4.2 mmol/L (ref 3.5–5.1)
Sodium: 137 mmol/L (ref 135–145)
Total Bilirubin: 0.4 mg/dL (ref 0.3–1.2)
Total Protein: 7.6 g/dL (ref 6.5–8.1)

## 2014-07-19 LAB — IRON AND TIBC
Iron: 120 ug/dL (ref 28–170)
Saturation Ratios: 31 % (ref 10.4–31.8)
TIBC: 389 ug/dL (ref 250–450)
UIBC: 269 ug/dL

## 2014-07-19 LAB — FERRITIN: FERRITIN: 37 ng/mL (ref 11–307)

## 2014-07-19 NOTE — Progress Notes (Signed)
Labs drawn

## 2014-07-20 ENCOUNTER — Other Ambulatory Visit: Payer: Self-pay

## 2014-07-21 ENCOUNTER — Other Ambulatory Visit (HOSPITAL_COMMUNITY)
Admission: RE | Admit: 2014-07-21 | Discharge: 2014-07-21 | Disposition: A | Discharge status: Home / Self Care | Payer: Medicaid Other | Source: Ambulatory Visit | Attending: Advanced Practice Midwife | Admitting: Advanced Practice Midwife

## 2014-07-21 ENCOUNTER — Ambulatory Visit (INDEPENDENT_AMBULATORY_CARE_PROVIDER_SITE_OTHER): Payer: Medicaid Other | Admitting: Advanced Practice Midwife

## 2014-07-21 ENCOUNTER — Encounter: Payer: Self-pay | Admitting: Advanced Practice Midwife

## 2014-07-21 VITALS — BP 130/70 | HR 76 | Wt 354.0 lb

## 2014-07-21 DIAGNOSIS — Z369 Encounter for antenatal screening, unspecified: Secondary | ICD-10-CM

## 2014-07-21 DIAGNOSIS — I10 Essential (primary) hypertension: Secondary | ICD-10-CM | POA: Diagnosis not present

## 2014-07-21 DIAGNOSIS — O09891 Supervision of other high risk pregnancies, first trimester: Secondary | ICD-10-CM

## 2014-07-21 DIAGNOSIS — Z3682 Encounter for antenatal screening for nuchal translucency: Secondary | ICD-10-CM

## 2014-07-21 DIAGNOSIS — Z113 Encounter for screening for infections with a predominantly sexual mode of transmission: Secondary | ICD-10-CM | POA: Insufficient documentation

## 2014-07-21 DIAGNOSIS — I2699 Other pulmonary embolism without acute cor pulmonale: Secondary | ICD-10-CM

## 2014-07-21 DIAGNOSIS — Z01419 Encounter for gynecological examination (general) (routine) without abnormal findings: Secondary | ICD-10-CM | POA: Insufficient documentation

## 2014-07-21 DIAGNOSIS — Z331 Pregnant state, incidental: Secondary | ICD-10-CM

## 2014-07-21 DIAGNOSIS — Z1389 Encounter for screening for other disorder: Secondary | ICD-10-CM | POA: Diagnosis not present

## 2014-07-21 DIAGNOSIS — Z1151 Encounter for screening for human papillomavirus (HPV): Secondary | ICD-10-CM | POA: Insufficient documentation

## 2014-07-21 DIAGNOSIS — Z0283 Encounter for blood-alcohol and blood-drug test: Secondary | ICD-10-CM

## 2014-07-21 LAB — POCT URINALYSIS DIPSTICK
GLUCOSE UA: NEGATIVE
KETONES UA: NEGATIVE
Leukocytes, UA: NEGATIVE
Nitrite, UA: NEGATIVE
Protein, UA: NEGATIVE
RBC UA: NEGATIVE

## 2014-07-21 NOTE — Progress Notes (Signed)
Subjective:    [name] [name] is a [id number] [redacted]w[redacted]d being seen today for her first obstetrical visit.  Her obstetrical history is significant for 3 1st trimester SAB's.  Had negative W/U for coag disorders.  Had PE 4/16 where only risk factors were smoking and obesity/sedintary lifestyle.  .  She saw Dr. Elonda [name] last week who changed her meds from Xarelto to Lovenonx and d/c'd lisinopril, keeping her on norvasc.  She had decreased her smoking to 5/day recently, but quit all together Sunday.  Has noticed she eats a lot more (to have something to satisfy her oral fixation).    Patient reports no c/o today.    Filed Vitals:   07/21/14 1202  BP: 130/70  Pulse: 76  Weight: 354 lb (160.573 kg)    HISTORY: OB History  Gravida Para Term Preterm AB SAB TAB Ectopic Multiple Living  4    3 3         # Outcome Date GA Lbr Len/2nd Weight Sex Delivery Anes PTL Lv  4 Current           3 SAB 2014          2 SAB 2013          1 SAB 2012             Past Medical History  Diagnosis Date  . Closed left ankle fracture     August 30 2012  . Obesity   . Tobacco use disorder   . High blood pressure   . Pulmonary embolism 04/2013  . OSA on CPAP    History reviewed. No pertinent past surgical history. Family History  Problem Relation Age of Onset  . Hypertension Mother   . Diabetes Paternal Aunt   . Diabetes Paternal Uncle   . Other Father     blood clots  . Cancer Maternal Grandmother   . Heart attack Paternal Grandmother      Exam       Pelvic Exam:    Perineum: Normal Perineum   Vulva: normal   Vagina:  normal mucosa, normal discharge, no palpable nodules   Uterus Normal, Gravid, FH: 8 weeks [place]     Cervix: normal   Adnexa: Not palpable   Urinary:  urethral meatus normal    System:     Skin: normal coloration and turgor, no rashes    Neurologic: oriented, normal, normal mood   Extremities: normal strength, tone, and muscle mass   HEENT PERRLA   Mouth/Teeth mucous membranes  moist, normal dentition   Neck supple and no masses   Cardiovascular: regular rate and rhythm   Respiratory:  appears well, vitals normal, no respiratory distress, acyanotic.  Has long hx of labored breathing at times, presumeably dt morbid obesity   Abdomen: soft, non-tender;  FHR: 160 [place]          Assessment:    Pregnancy: G4P0030 Patient Active Problem List   Diagnosis Date Noted  . Supervision of other high-risk pregnancy 07/21/2014  . Benign essential hypertension 07/21/2014  . Iron deficiency anemia due to chronic blood loss 02/05/2014  . Back pain with left-sided radiculopathy 10/31/2013  . Secondary pulmonary hypertension 07/06/2013  . Obstructive sleep apnea 07/06/2013  . PE (pulmonary embolism) 04/27/2013  . Pleuritic chest pain 04/27/2013  . SOB (shortness of breath) 04/27/2013  . Hyperglycemia 04/27/2013  . Anemia 04/27/2013  . Tobacco use disorder 04/27/2013  . Saddle embolus of pulmonary artery with acute cor pulmonale 04/27/2013  .  Pulmonary embolism 04/21/2013        Plan:     Initial labs drawn. Continue prenatal vitamins  Try non flavored e cigs instead of eating (has already gained 20lbs!) Problem list reviewed and updated  Reviewed n/v relief measures and warning s/s to report  Reviewed recommended weight gain based on pre-gravid BMI :  No more weight gain! Encouraged well-balanced diet Genetic Screening discussed Integrated Screen: requested.  Ultrasound discussed; fetal survey: requested.  Follow up in 4 weeks for NT/IT.  [name],[name] Mulhall 07/21/2014

## 2014-07-21 NOTE — Patient Instructions (Signed)
Safe Medications in Pregnancy  ° °Acne: °Benzoyl Peroxide °Salicylic Acid ° °Backache/Headache: °Tylenol: 2 regular strength every 4 hours OR °             2 Extra strength every 6 hours ° °Colds/Coughs/Allergies: °Benadryl (alcohol free) 25 mg every 6 hours as needed °Breath right strips °Claritin °Cepacol throat lozenges °Chloraseptic throat spray °Cold-Eeze- up to three times per day °Cough drops, alcohol free °Flonase (by prescription only) °Guaifenesin °Mucinex °Robitussin DM (plain only, alcohol free) °Saline nasal spray/drops °Sudafed (pseudoephedrine) & Actifed ** use only after [redacted] weeks gestation and if you do not have high blood pressure °Tylenol °Vicks Vaporub °Zinc lozenges °Zyrtec  ° °Constipation: °Colace °Ducolax suppositories °Fleet enema °Glycerin suppositories °Metamucil °Milk of magnesia °Miralax °Senokot °Smooth move tea ° °Diarrhea: °Kaopectate °Imodium A-D ° °*NO pepto Bismol ° °Hemorrhoids: °Anusol °Anusol HC °Preparation H °Tucks ° °Indigestion: °Tums °Maalox °Mylanta °Zantac  °Pepcid ° °Insomnia: °Benadryl (alcohol free) 25mg every 6 hours as needed °Tylenol PM °Unisom, no Gelcaps ° °Leg Cramps: °Tums °MagGel ° °Nausea/Vomiting:  °Bonine °Dramamine °Emetrol °Ginger extract °Sea bands °Meclizine  °Nausea medication to take during pregnancy:  °Unisom (doxylamine succinate 25 mg tablets) Take one tablet daily at bedtime. If symptoms are not adequately controlled, the dose can be increased to a maximum recommended dose of two tablets daily (1/2 tablet in the morning, 1/2 tablet mid-afternoon and one at bedtime). °Vitamin B6 100mg tablets. Take one tablet twice a day (up to 200 mg per day). ° °Skin Rashes: °Aveeno products °Benadryl cream or 25mg every 6 hours as needed °Calamine Lotion °1% cortisone cream ° °Yeast infection: °Gyne-lotrimin 7 °Monistat 7 ° ° °**If taking multiple medications, please check labels to avoid duplicating the same active ingredients °**take medication as directed on  the label °** Do not exceed 4000 mg of tylenol in 24 hours °**Do not take medications that contain aspirin or ibuprofen ° ° ° °First Trimester of Pregnancy °The first trimester of pregnancy is from week 1 until the end of week 12 (months 1 through 3). A week after a sperm fertilizes an egg, the egg will implant on the wall of the uterus. This embryo will begin to develop into a baby. Genes from you and your partner are forming the baby. The female genes determine whether the baby is a boy or a girl. At 6-8 weeks, the eyes and face are formed, and the heartbeat can be seen on ultrasound. At the end of 12 weeks, all the baby's organs are formed.  °Now that you are pregnant, you will want to do everything you can to have a healthy baby. Two of the most important things are to get good prenatal care and to follow your health care provider's instructions. Prenatal care is all the medical care you receive before the baby's birth. This care will help prevent, find, and treat any problems during the pregnancy and childbirth. °BODY CHANGES °Your body goes through many changes during pregnancy. The changes vary from woman to woman.  °· You may gain or lose a couple of pounds at first. °· You may feel sick to your stomach (nauseous) and throw up (vomit). If the vomiting is uncontrollable, call your health care provider. °· You may tire easily. °· You may develop headaches that can be relieved by medicines approved by your health care provider. °· You may urinate more often. Painful urination may mean you have a bladder infection. °· You may develop heartburn as a result of your   pregnancy. °· You may develop constipation because certain hormones are causing the muscles that push waste through your intestines to slow down. °· You may develop hemorrhoids or swollen, bulging veins (varicose veins). °· Your breasts may begin to grow larger and become tender. Your nipples may stick out more, and the tissue that surrounds them (areola)  may become darker. °· Your gums may bleed and may be sensitive to brushing and flossing. °· Dark spots or blotches (chloasma, mask of pregnancy) may develop on your face. This will likely fade after the baby is born. °· Your menstrual periods will stop. °· You may have a loss of appetite. °· You may develop cravings for certain kinds of food. °· You may have changes in your emotions from day to day, such as being excited to be pregnant or being concerned that something may go wrong with the pregnancy and baby. °· You may have more vivid and strange dreams. °· You may have changes in your hair. These can include thickening of your hair, rapid growth, and changes in texture. Some women also have hair loss during or after pregnancy, or hair that feels dry or thin. Your hair will most likely return to normal after your baby is born. °WHAT TO EXPECT AT YOUR PRENATAL VISITS °During a routine prenatal visit: °· You will be weighed to make sure you and the baby are growing normally. °· Your blood pressure will be taken. °· Your abdomen will be measured to track your baby's growth. °· The fetal heartbeat will be listened to starting around week 10 or 12 of your pregnancy. °· Test results from any previous visits will be discussed. °Your health care provider may ask you: °· How you are feeling. °· If you are feeling the baby move. °· If you have had any abnormal symptoms, such as leaking fluid, bleeding, severe headaches, or abdominal cramping. °· If you have any questions. °Other tests that may be performed during your first trimester include: °· Blood tests to find your blood type and to check for the presence of any previous infections. They will also be used to check for low iron levels (anemia) and Rh antibodies. Later in the pregnancy, blood tests for diabetes will be done along with other tests if problems develop. °· Urine tests to check for infections, diabetes, or protein in the urine. °· An ultrasound to confirm  the proper growth and development of the baby. °· An amniocentesis to check for possible genetic problems. °· Fetal screens for spina bifida and Down syndrome. °· You may need other tests to make sure you and the baby are doing well. °HOME CARE INSTRUCTIONS  °Medicines °· Follow your health care provider's instructions regarding medicine use. Specific medicines may be either safe or unsafe to take during pregnancy. °· Take your prenatal vitamins as directed. °· If you develop constipation, try taking a stool softener if your health care provider approves. °Diet °· Eat regular, well-balanced meals. Choose a variety of foods, such as meat or vegetable-based protein, fish, milk and low-fat dairy products, vegetables, fruits, and whole grain breads and cereals. Your health care provider will help you determine the amount of weight gain that is right for you. °· Avoid raw meat and uncooked cheese. These carry germs that can cause birth defects in the baby. °· Eating four or five small meals rather than three large meals a day may help relieve nausea and vomiting. If you start to feel nauseous, eating a few soda crackers   can be helpful. Drinking liquids between meals instead of during meals also seems to help nausea and vomiting. °· If you develop constipation, eat more high-fiber foods, such as fresh vegetables or fruit and whole grains. Drink enough fluids to keep your urine clear or pale yellow. °Activity and Exercise °· Exercise only as directed by your health care provider. Exercising will help you: °¨ Control your weight. °¨ Stay in shape. °¨ Be prepared for labor and delivery. °· Experiencing pain or cramping in the lower abdomen or low back is a good sign that you should stop exercising. Check with your health care provider before continuing normal exercises. °· Try to avoid standing for long periods of time. Move your legs often if you must stand in one place for a long time. °· Avoid heavy lifting. °· Wear  low-heeled shoes, and practice good posture. °· You may continue to have sex unless your health care provider directs you otherwise. °Relief of Pain or Discomfort °· Wear a good support bra for breast tenderness.   °· Take warm sitz baths to soothe any pain or discomfort caused by hemorrhoids. Use hemorrhoid cream if your health care provider approves.   °· Rest with your legs elevated if you have leg cramps or low back pain. °· If you develop varicose veins in your legs, wear support hose. Elevate your feet for 15 minutes, 3-4 times a day. Limit salt in your diet. °Prenatal Care °· Schedule your prenatal visits by the twelfth week of pregnancy. They are usually scheduled monthly at first, then more often in the last 2 months before delivery. °· Write down your questions. Take them to your prenatal visits. °· Keep all your prenatal visits as directed by your health care provider. °Safety °· Wear your seat belt at all times when driving. °· Make a list of emergency phone numbers, including numbers for family, friends, the hospital, and police and fire departments. °General Tips °· Ask your health care provider for a referral to a local prenatal education class. Begin classes no later than at the beginning of month 6 of your pregnancy. °· Ask for help if you have counseling or nutritional needs during pregnancy. Your health care provider can offer advice or refer you to specialists for help with various needs. °· Do not use hot tubs, steam rooms, or saunas. °· Do not douche or use tampons or scented sanitary pads. °· Do not cross your legs for long periods of time. °· Avoid cat litter boxes and soil used by cats. These carry germs that can cause birth defects in the baby and possibly loss of the fetus by miscarriage or stillbirth. °· Avoid all smoking, herbs, alcohol, and medicines not prescribed by your health care provider. Chemicals in these affect the formation and growth of the baby. °· Schedule a dentist  appointment. At home, brush your teeth with a soft toothbrush and be gentle when you floss. °SEEK MEDICAL CARE IF:  °· You have dizziness. °· You have mild pelvic cramps, pelvic pressure, or nagging pain in the abdominal area. °· You have persistent nausea, vomiting, or diarrhea. °· You have a bad smelling vaginal discharge. °· You have pain with urination. °· You notice increased swelling in your face, hands, legs, or ankles. °SEEK IMMEDIATE MEDICAL CARE IF:  °· You have a fever. °· You are leaking fluid from your vagina. °· You have spotting or bleeding from your vagina. °· You have severe abdominal cramping or pain. °· You have rapid weight gain   or loss. °· You vomit blood or material that looks like coffee grounds. °· You are exposed to German measles and have never had them. °· You are exposed to fifth disease or chickenpox. °· You develop a severe headache. °· You have shortness of breath. °· You have any kind of trauma, such as from a fall or a car accident. °Document Released: 01/01/2001 Document Revised: 05/24/2013 Document Reviewed: 11/17/2012 °ExitCare® Patient Information ©2015 [employer]. This information is not intended to replace advice given to you by your health care provider. Make sure you discuss any questions you have with your health care provider. ° °

## 2014-07-22 ENCOUNTER — Other Ambulatory Visit (HOSPITAL_COMMUNITY): Payer: Self-pay | Admitting: *Deleted

## 2014-07-22 LAB — CYTOLOGY - PAP

## 2014-07-22 LAB — URINE CULTURE: Organism ID, Bacteria: NO GROWTH

## 2014-07-22 MED ORDER — IRON POLYSACCH CMPLX-B12-FA 150-0.025-1 MG PO CAPS: 1.0000 | ORAL_CAPSULE | Freq: Every day | ORAL | Status: DC

## 2014-07-27 LAB — CYSTIC FIBROSIS MUTATION 97: Interpretation: NOT DETECTED

## 2014-07-27 LAB — URINALYSIS, ROUTINE W REFLEX MICROSCOPIC
BILIRUBIN UA: NEGATIVE
GLUCOSE, UA: NEGATIVE
Ketones, UA: NEGATIVE
Leukocytes, UA: NEGATIVE
NITRITE UA: NEGATIVE
Protein, UA: NEGATIVE
RBC, UA: NEGATIVE
Specific Gravity, UA: 1.023 (ref 1.005–1.030)
Urobilinogen, Ur: 1 mg/dL (ref 0.2–1.0)
pH, UA: 6.5 (ref 5.0–7.5)

## 2014-07-27 LAB — ABO/RH: Rh Factor: POSITIVE

## 2014-07-27 LAB — CBC
HEMOGLOBIN: 12.3 g/dL (ref 11.1–15.9)
Hematocrit: 38.1 % (ref 34.0–46.6)
MCH: 29.6 pg (ref 26.6–33.0)
MCHC: 32.3 g/dL (ref 31.5–35.7)
MCV: 92 fL (ref 79–97)
Platelets: 229 10*3/uL (ref 150–379)
RBC: 4.15 x10E6/uL (ref 3.77–5.28)
RDW: 15.1 % (ref 12.3–15.4)
WBC: 3.7 10*3/uL (ref 3.4–10.8)

## 2014-07-27 LAB — HEPATITIS B SURFACE ANTIGEN: Hepatitis B Surface Ag: NEGATIVE

## 2014-07-27 LAB — PMP SCREEN PROFILE (10S), URINE
AMPHETAMINE SCRN UR: NEGATIVE ng/mL
BARBITURATE SCRN UR: NEGATIVE ng/mL
Benzodiazepine Screen, Urine: NEGATIVE ng/mL
COCAINE(METAB.) SCREEN, URINE: NEGATIVE ng/mL
CREATININE(CRT), U: 221.1 mg/dL (ref 20.0–300.0)
Cannabinoids Ur Ql Scn: NEGATIVE ng/mL
Methadone Scn, Ur: NEGATIVE ng/mL
Opiate Scrn, Ur: NEGATIVE ng/mL
Oxycodone+Oxymorphone Ur Ql Scn: NEGATIVE ng/mL
PCP SCRN UR: NEGATIVE ng/mL
PROPOXYPHENE SCREEN: NEGATIVE ng/mL
Ph of Urine: 6.4 (ref 4.5–8.9)

## 2014-07-27 LAB — VARICELLA ZOSTER ANTIBODY, IGG: VARICELLA: 459 {index} (ref >165)

## 2014-07-27 LAB — RPR: RPR: NONREACTIVE

## 2014-07-27 LAB — RUBELLA SCREEN: Rubella Antibodies, IGG: <0.9 index — ABNORMAL LOW (ref >0.99)

## 2014-07-27 LAB — ANTIBODY SCREEN: Antibody Screen: NEGATIVE

## 2014-07-27 LAB — SICKLE CELL SCREEN: SICKLE CELL SCREEN: NEGATIVE

## 2014-07-27 LAB — HIV ANTIBODY (ROUTINE TESTING W REFLEX): HIV Screen 4th Generation wRfx: NONREACTIVE

## 2014-08-01 ENCOUNTER — Encounter: Payer: Self-pay | Admitting: Women's Health

## 2014-08-04 ENCOUNTER — Telehealth (HOSPITAL_COMMUNITY): Payer: Self-pay | Admitting: Hematology & Oncology

## 2014-08-04 NOTE — Telephone Encounter (Signed)
Faxed lovenox refill request to [facility] [phone]

## 2014-08-04 NOTE — Telephone Encounter (Signed)
[facility] [phone])  PT ID [id number] **[facility] WILL FAX OVER PRODUCT SHIPMENT FORM TO BE COMPLETED AND RETURNED**  [facility] [phone]

## 2014-08-10 ENCOUNTER — Telehealth (HOSPITAL_COMMUNITY): Payer: Self-pay | Admitting: Oncology

## 2014-08-10 NOTE — Telephone Encounter (Signed)
6 boxes with 10 each of Lovenox 150mg  arrived from [facility] for patient, and were checked in to [place], [address] [place].  [name] [name] was contacted and notified of arrival, coming on 08/11/14 to pick up medications.

## 2014-08-18 ENCOUNTER — Ambulatory Visit (INDEPENDENT_AMBULATORY_CARE_PROVIDER_SITE_OTHER): Payer: Medicaid Other

## 2014-08-18 ENCOUNTER — Encounter: Payer: Self-pay | Admitting: Obstetrics & Gynecology

## 2014-08-18 ENCOUNTER — Ambulatory Visit (INDEPENDENT_AMBULATORY_CARE_PROVIDER_SITE_OTHER): Payer: Medicaid Other | Admitting: Obstetrics & Gynecology

## 2014-08-18 VITALS — BP 120/70 | HR 76 | Wt 358.0 lb

## 2014-08-18 DIAGNOSIS — Z1389 Encounter for screening for other disorder: Secondary | ICD-10-CM | POA: Diagnosis not present

## 2014-08-18 DIAGNOSIS — D259 Leiomyoma of uterus, unspecified: Secondary | ICD-10-CM | POA: Diagnosis not present

## 2014-08-18 DIAGNOSIS — Z331 Pregnant state, incidental: Secondary | ICD-10-CM

## 2014-08-18 DIAGNOSIS — O09522 Supervision of elderly multigravida, second trimester: Secondary | ICD-10-CM | POA: Diagnosis not present

## 2014-08-18 DIAGNOSIS — I10 Essential (primary) hypertension: Secondary | ICD-10-CM

## 2014-08-18 DIAGNOSIS — O0942 Supervision of pregnancy with grand multiparity, second trimester: Secondary | ICD-10-CM

## 2014-08-18 DIAGNOSIS — E66813 Obesity, class 3: Secondary | ICD-10-CM | POA: Insufficient documentation

## 2014-08-18 DIAGNOSIS — Z3682 Encounter for antenatal screening for nuchal translucency: Secondary | ICD-10-CM

## 2014-08-18 DIAGNOSIS — Z36 Encounter for antenatal screening of mother: Secondary | ICD-10-CM

## 2014-08-18 DIAGNOSIS — O09891 Supervision of other high risk pregnancies, first trimester: Secondary | ICD-10-CM

## 2014-08-18 DIAGNOSIS — O09892 Supervision of other high risk pregnancies, second trimester: Secondary | ICD-10-CM | POA: Diagnosis not present

## 2014-08-18 DIAGNOSIS — I2699 Other pulmonary embolism without acute cor pulmonale: Secondary | ICD-10-CM

## 2014-08-18 LAB — POCT URINALYSIS DIPSTICK
Glucose, UA: NEGATIVE
KETONES UA: NEGATIVE
LEUKOCYTES UA: NEGATIVE
Nitrite, UA: NEGATIVE
Protein, UA: NEGATIVE
RBC UA: NEGATIVE

## 2014-08-18 MED ORDER — PRENATAL COMPLETE 14-0.4 MG PO TABS: 1.0000 | ORAL_TABLET | Freq: Every day | ORAL | Status: DC

## 2014-08-18 NOTE — Progress Notes (Signed)
NT/NB [place] attempted today. Unsuccessful due to patients body habitus and fibroids.  Single, active fetus with FHR 165 bpm.  CRL measures 62.4 mm which is consistent with dating.Marland Kitchen Anterior left fibroid measures 9 x 9.x 8.7cm. Anterior mid fibroid measures 8.3 x 6.1 x 6.2 cm. Subchorionic hemorrhage noted measuring 4.9 x 1.6 x 3.2 cm. Bilateral ovaries appear normal.

## 2014-08-18 NOTE — Progress Notes (Signed)
Fetal Surveillance Testing today:  Sonogram NT   High Risk Pregnancy Diagnosis(es):   PE on lovenox, AMA  G4P0030 [redacted]w[redacted]d Estimated Date of Delivery: 03/02/15  Blood pressure 120/70, pulse 76, weight 358 lb (162.388 kg), last menstrual period 05/23/2014.  Urinalysis: Negative   HPI: The patient is being seen today for ongoing management of PE. Today she reports no bleeding Could not do NT maternal habitus + fibroids: do cfDNA today and AFP(only, not quad screen)in 4 weeks   BP weight and urine results all reviewed and noted. Patient reports good fetal movement, denies any bleeding and no rupture of membranes symptoms or regular contractions.  Fundal Height:   Fetal Heart rate:  165 Edema:  1+  Patient is without complaints other than noted in her HPI. All questions were answered.  All lab and sonogram results have been reviewed. Comments: abnormal:    Assessment:  1.  Pregnancy at [redacted]w[redacted]d,  Estimated Date of Delivery: 03/02/15 :                          2.  PE, saddle on lovenox anti coag dosing                        3.    Medication(s) Plans:  lovenox 150 mg SQ daily  Treatment Plan:  continue  Follow up in 4 weeks for appointment for high risk OB care, AFP

## 2014-08-23 LAB — INFORMASEQ(SM) WITH XY ANALYSIS
FETAL FRACTION (%): 9.4
Fetal Number: 1
Gestational Age at Collection: 12 weeks
Weight: 358 [lb_av]

## 2014-09-07 ENCOUNTER — Telehealth (HOSPITAL_COMMUNITY): Payer: Self-pay | Admitting: Hematology & Oncology

## 2014-09-07 ENCOUNTER — Other Ambulatory Visit (HOSPITAL_COMMUNITY): Payer: Self-pay | Admitting: *Deleted

## 2014-09-07 DIAGNOSIS — Z3490 Encounter for supervision of normal pregnancy, unspecified, unspecified trimester: Secondary | ICD-10-CM

## 2014-09-07 DIAGNOSIS — I2699 Other pulmonary embolism without acute cor pulmonale: Secondary | ICD-10-CM

## 2014-09-07 DIAGNOSIS — I2602 Saddle embolus of pulmonary artery with acute cor pulmonale: Secondary | ICD-10-CM

## 2014-09-07 MED ORDER — ENOXAPARIN SODIUM 150 MG/ML ~~LOC~~ SOLN: 1.0000 mg/kg | Freq: Two times a day (BID) | SUBCUTANEOUS | Status: DC

## 2014-09-07 NOTE — Telephone Encounter (Signed)
Faxed lovenox refill request to [facility] [phone]

## 2014-09-15 ENCOUNTER — Encounter: Payer: Self-pay | Admitting: Obstetrics & Gynecology

## 2014-09-15 ENCOUNTER — Ambulatory Visit (INDEPENDENT_AMBULATORY_CARE_PROVIDER_SITE_OTHER): Payer: Medicaid Other | Admitting: Obstetrics & Gynecology

## 2014-09-15 VITALS — BP 130/80 | HR 80 | Wt 353.8 lb

## 2014-09-15 DIAGNOSIS — O0942 Supervision of pregnancy with grand multiparity, second trimester: Secondary | ICD-10-CM

## 2014-09-15 DIAGNOSIS — O09892 Supervision of other high risk pregnancies, second trimester: Secondary | ICD-10-CM | POA: Diagnosis not present

## 2014-09-15 DIAGNOSIS — Z1389 Encounter for screening for other disorder: Secondary | ICD-10-CM | POA: Diagnosis not present

## 2014-09-15 DIAGNOSIS — O162 Unspecified maternal hypertension, second trimester: Secondary | ICD-10-CM | POA: Diagnosis not present

## 2014-09-15 DIAGNOSIS — Z331 Pregnant state, incidental: Secondary | ICD-10-CM | POA: Diagnosis not present

## 2014-09-15 DIAGNOSIS — I2699 Other pulmonary embolism without acute cor pulmonale: Secondary | ICD-10-CM

## 2014-09-15 DIAGNOSIS — O09522 Supervision of elderly multigravida, second trimester: Secondary | ICD-10-CM | POA: Diagnosis not present

## 2014-09-15 DIAGNOSIS — Z369 Encounter for antenatal screening, unspecified: Secondary | ICD-10-CM

## 2014-09-15 LAB — POCT URINALYSIS DIPSTICK
GLUCOSE UA: NEGATIVE
Leukocytes, UA: NEGATIVE
NITRITE UA: NEGATIVE
RBC UA: NEGATIVE

## 2014-09-15 NOTE — Progress Notes (Signed)
Fetal Surveillance Testing today:  none   High Risk Pregnancy Diagnosis(es):   Pulmonary embolus(04/2013), chronic hypertension, chronic hypertension  G4P0030 [redacted]w[redacted]d Estimated Date of Delivery: 03/02/15  Blood pressure 130/80, pulse 80, weight 353 lb 12.8 oz (160.483 kg), last menstrual period 05/23/2014.  Urinalysis: Negative   HPI: The patient is being seen today for ongoing management of as above. Today she reports no problems   BP weight and urine results all reviewed and noted. Patient reports good fetal movement, denies any bleeding and no rupture of membranes symptoms or regular contractions.  Fundal Height:   Fetal Heart rate:  152 Edema:  none  Patient is without complaints other than noted in her HPI. All questions were answered.  All lab and sonogram results have been reviewed. Comments:    Assessment:  1.  Pregnancy at [redacted]w[redacted]d,  Estimated Date of Delivery: 03/02/15 :                          2.  PE                        3.  Chronic hypertension  Medication(s) Plans:  No changes lovenox 150 SQ, amlodipine 2.5 mg daily  Treatment Plan:    Follow up in 4 weeks for appointment for high risk OB care, sonogram

## 2014-09-15 NOTE — Addendum Note (Signed)
Addended by: Diona Fanti A on: 09/15/2014 11:25 AM   Modules accepted: Orders

## 2014-09-16 ENCOUNTER — Ambulatory Visit (HOSPITAL_COMMUNITY): Payer: Self-pay | Admitting: Hematology & Oncology

## 2014-09-16 ENCOUNTER — Other Ambulatory Visit (HOSPITAL_COMMUNITY): Payer: Self-pay

## 2014-09-19 LAB — AFP, SERUM, OPEN SPINA BIFIDA
AFP MoM: 1.49
AFP Value: 33 ng/mL
GEST. AGE ON COLLECTION DATE: 16 wk
Maternal Age At EDD: 38.3 years
OSBR Risk 1 IN: 5617
TEST RESULTS AFP: NEGATIVE
Weight: 353 [lb_av]

## 2014-09-20 ENCOUNTER — Encounter (HOSPITAL_BASED_OUTPATIENT_CLINIC_OR_DEPARTMENT_OTHER): Payer: Medicaid Other

## 2014-09-20 ENCOUNTER — Encounter (HOSPITAL_COMMUNITY): Payer: Self-pay | Admitting: Oncology

## 2014-09-20 ENCOUNTER — Encounter (HOSPITAL_COMMUNITY): Payer: Medicaid Other | Attending: Hematology & Oncology | Admitting: Oncology

## 2014-09-20 VITALS — BP 156/84 | HR 71 | Temp 99.1°F | Resp 16 | Wt 356.0 lb

## 2014-09-20 DIAGNOSIS — Z86711 Personal history of pulmonary embolism: Secondary | ICD-10-CM

## 2014-09-20 DIAGNOSIS — D509 Iron deficiency anemia, unspecified: Secondary | ICD-10-CM

## 2014-09-20 DIAGNOSIS — I2602 Saddle embolus of pulmonary artery with acute cor pulmonale: Secondary | ICD-10-CM | POA: Insufficient documentation

## 2014-09-20 DIAGNOSIS — D500 Iron deficiency anemia secondary to blood loss (chronic): Secondary | ICD-10-CM | POA: Diagnosis not present

## 2014-09-20 LAB — COMPREHENSIVE METABOLIC PANEL
ALBUMIN: 3.3 g/dL — AB (ref 3.5–5.0)
ALK PHOS: 42 U/L (ref 38–126)
ALT: 16 U/L (ref 14–54)
AST: 15 U/L (ref 15–41)
Anion gap: 6 (ref 5–15)
BUN: 6 mg/dL (ref 6–20)
CHLORIDE: 107 mmol/L (ref 101–111)
CO2: 26 mmol/L (ref 22–32)
CREATININE: 0.52 mg/dL (ref 0.44–1.00)
Calcium: 9.2 mg/dL (ref 8.9–10.3)
GFR calc non Af Amer: >60 mL/min (ref >60)
GLUCOSE: 94 mg/dL (ref 65–99)
Potassium: 4 mmol/L (ref 3.5–5.1)
SODIUM: 139 mmol/L (ref 135–145)
Total Bilirubin: 0.5 mg/dL (ref 0.3–1.2)
Total Protein: 7.6 g/dL (ref 6.5–8.1)

## 2014-09-20 LAB — IRON AND TIBC
Iron: 93 ug/dL (ref 28–170)
SATURATION RATIOS: 23 % (ref 10.4–31.8)
TIBC: 398 ug/dL (ref 250–450)
UIBC: 305 ug/dL

## 2014-09-20 LAB — CBC WITH DIFFERENTIAL/PLATELET
BASOS ABS: 0 10*3/uL (ref 0.0–0.1)
BASOS PCT: 0 % (ref 0–1)
EOS ABS: 0.1 10*3/uL (ref 0.0–0.7)
EOS PCT: 2 % (ref 0–5)
HCT: 36.5 % (ref 36.0–46.0)
HEMOGLOBIN: 12.4 g/dL (ref 12.0–15.0)
LYMPHS ABS: 1.1 10*3/uL (ref 0.7–4.0)
Lymphocytes Relative: 22 % (ref 12–46)
MCH: 31.3 pg (ref 26.0–34.0)
MCHC: 34 g/dL (ref 30.0–36.0)
MCV: 92.2 fL (ref 78.0–100.0)
Monocytes Absolute: 0.4 10*3/uL (ref 0.1–1.0)
Monocytes Relative: 8 % (ref 3–12)
NEUTROS PCT: 68 % (ref 43–77)
Neutro Abs: 3.3 10*3/uL (ref 1.7–7.7)
PLATELETS: 204 10*3/uL (ref 150–400)
RBC: 3.96 MIL/uL (ref 3.87–5.11)
RDW: 14.5 % (ref 11.5–15.5)
WBC: 4.8 10*3/uL (ref 4.0–10.5)

## 2014-09-20 LAB — FERRITIN: FERRITIN: 23 ng/mL (ref 11–307)

## 2014-09-20 NOTE — Patient Instructions (Signed)
[name] [name] at [facility] Discharge Instructions  RECOMMENDATIONS MADE BY THE CONSULTANT AND ANY TEST RESULTS WILL BE SENT TO YOUR REFERRING PHYSICIAN.  Exam completed by Kirby Crigler  Continue on lovenox twice a day Continue taking iron daily Labs in 2 months  Return to see the doctor in 2 months Please call the clinic if you have any questions or concerns  Thank you for choosing [place] at [facility] to provide your oncology and hematology care.  To afford each patient quality time with our provider, please arrive at least 15 minutes before your scheduled appointment time.    You need to re-schedule your appointment should you arrive 10 or more minutes late.  We strive to give you quality time with our providers, and arriving late affects you and other patients whose appointments are after yours.  Also, if you no show three or more times for appointments you may be dismissed from the clinic at the providers discretion.     Again, thank you for choosing [facility]  Our hope is that these requests will decrease the amount of time that you wait before being seen by our physicians.       _____________________________________________________________  Should you have questions after your visit to [facility], please contact our office at [phone] between the hours of 8:30 a.m. and 4:30 p.m.  Voicemails left after 4:30 p.m. will not be returned until the following business day.  For prescription refill requests, have your pharmacy contact our office.

## 2014-09-20 NOTE — Assessment & Plan Note (Addendum)
history of PE with an IVC filter placed on 04/29/2013 with a history of saddle embolism on CT imaging on 04/27/2013 and 2 miscarriages at 9 and 6 weeks, currently on Lovenox anticoagulation in the setting of high risk pregnancy.  Labs today: CBC diff  CBC diff today is stable with a normal Hgb, WBC, and platelet count.  Labs in 2 months: CBC diff.  Return in 2 months for follow-up.

## 2014-09-20 NOTE — Progress Notes (Signed)
Labs drawn

## 2014-09-20 NOTE — Progress Notes (Signed)
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  INTERVAL HISTORY: [name] [name] 39 y.o. female returns for followup of history of PE with an IVC filter placed on 04/29/2013 with a history of saddle embolism on CT imaging on 04/27/2013 and 2 miscarriages at 9 and 6 weeks. AND Iron deficiency secondary to heavy menses on anticoagulation. AND High risk pregnancy  I personally reviewed and went over laboratory results with the patient.  The results are noted within this dictation.  WBC, Hgb, and platelet count are stable and WNL.  She is tolerating PO iron well without any complaints or issues at this time.   She continues with Lovenox BID dosing.  Other than bruising she is tolerating well without any signs or symptoms of active blood loss.  She is [redacted] weeks pregnant.   Past Medical History  Diagnosis Date  . Closed left ankle fracture     August 30 2012  . Obesity   . Tobacco use disorder   . High blood pressure   . Pulmonary embolism 04/2013  . OSA on CPAP   . Pregnancy     09/20/14 17 weeks    has PE (pulmonary embolism); Pleuritic chest pain; SOB (shortness of breath); Hyperglycemia; Anemia; Tobacco use disorder; Saddle embolus of pulmonary artery with acute cor pulmonale; Secondary pulmonary hypertension; Obstructive sleep apnea; Pulmonary embolism; Back pain with left-sided radiculopathy; Iron deficiency anemia due to chronic blood loss; Supervision of other high-risk pregnancy; Benign essential hypertension; Rubella non-immune status, antepartum; High risk multigravida; Morbid obesity; Fibroid, uterine; AMA (advanced maternal age) multigravida 49+; and Hypertension affecting pregnancy on her problem list.     is allergic to sulfa  antibiotics.  Current Outpatient Prescriptions on File Prior to Visit  Medication Sig Dispense Refill  . amLODipine (NORVASC) 2.5 MG tablet Take 2.5 mg by mouth daily.     Marland Kitchen enoxaparin (LOVENOX) 150 MG/ML injection Inject 1.04 mLs (155 mg total) into the skin every 12 (twelve) hours. 60 Syringe 6  . Iron Polysacch Cmplx-B12-FA 150-0.025-1 MG CAPS Take 1 capsule by mouth daily. 30 each 6  . Prenatal Vit-Fe Fumarate-FA (PRENATAL COMPLETE) 14-0.4 MG TABS Take 1 tablet by mouth daily. 100 each 3   No current facility-administered medications on file prior to visit.    History reviewed. No pertinent past surgical history.  Denies any headaches, dizziness, double vision, fevers, chills, night sweats, nausea, vomiting, diarrhea, constipation, chest pain, heart palpitations, shortness of breath, blood in stool, black tarry stool, urinary pain, urinary burning, urinary frequency, hematuria.   PHYSICAL EXAMINATION  ECOG PERFORMANCE STATUS: 0 - Asymptomatic  Filed Vitals:   09/20/14 0917  BP: 156/84  Pulse: 71  Temp: 99.1 F (37.3 C)  Resp: 16    GENERAL:alert, no distress, well nourished, well developed, comfortable, cooperative, obese and smiling SKIN: skin color, texture, turgor are normal, no rashes or significant lesions HEAD: Normocephalic, No masses, lesions, tenderness or abnormalities EYES: normal, PERRLA, EOMI, Conjunctiva are pink and non-injected EARS: External ears normal OROPHARYNX:lips, buccal mucosa, and tongue normal and mucous membranes are moist  NECK: supple, no adenopathy, thyroid normal size, non-tender, without nodularity, trachea midline LYMPH:  not examined BREAST:not examined LUNGS: clear to auscultation  HEART: regular rate & rhythm ABDOMEN:normal bowel sounds  BACK: Back symmetric, no curvature. EXTREMITIES:less then 2 second capillary refill, no joint deformities, effusion, or inflammation, no skin discoloration, no cyanosis  NEURO: alert & oriented x 3  with fluent speech, no focal motor/sensory deficits   LABORATORY DATA: CBC    Component Value Date/Time   WBC 4.8 09/20/2014 0922   WBC 3.7 07/21/2014 1326   RBC 3.96 09/20/2014 0922   RBC 4.15 07/21/2014 1326   HGB 12.4 09/20/2014 0922   HCT 36.5 09/20/2014 0922   HCT 38.1 07/21/2014 1326   PLT 204 09/20/2014 0922   MCV 92.2 09/20/2014 0922   MCH 31.3 09/20/2014 0922   MCH 29.6 07/21/2014 1326   MCHC 34.0 09/20/2014 0922   MCHC 32.3 07/21/2014 1326   RDW 14.5 09/20/2014 0922   RDW 15.1 07/21/2014 1326   LYMPHSABS 1.1 09/20/2014 0922   MONOABS 0.4 09/20/2014 0922   EOSABS 0.1 09/20/2014 0922   BASOSABS 0.0 09/20/2014 0922      Chemistry      Component Value Date/Time   NA 137 07/19/2014 1244   K 4.2 07/19/2014 1244   CL 104 07/19/2014 1244   CO2 26 07/19/2014 1244   BUN 12 07/19/2014 1244   CREATININE 0.70 07/19/2014 1244      Component Value Date/Time   CALCIUM 9.1 07/19/2014 1244   ALKPHOS 40 07/19/2014 1244   AST 57* 07/19/2014 1244   ALT 88* 07/19/2014 1244   BILITOT 0.4 07/19/2014 1244     Lab Results  Component Value Date   IRON 120 07/19/2014   TIBC 389 07/19/2014   FERRITIN 37 07/19/2014     PENDING LABS:   RADIOGRAPHIC STUDIES:  No results found.   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  Labs today: CBC diff  CBC diff today is stable with a normal Hgb, WBC, and platelet count.  Labs in 2 months: CBC diff.  Return in 2 months for follow-up.   Iron deficiency anemia due to chronic blood loss Iron deficiency secondary to heavy menses in the setting of anticoagulation.  S/P IV Feraheme:    Oncology Flowsheet 02/17/2014 02/24/2014 03/03/2014 03/10/2014   ferumoxytol [facility]) IV 510 mg 510 mg 510 mg 510 mg   Currently on PO iron supplement.  Tolerating well.  Iron studies from today are pending.  Normalization of Hgb noted.  Labs today: CBC diff, iron/TIBC, ferritin  Labs in 2 months: CBC diff, iron/TIBC, ferritin.     THERAPY PLAN:  Continue with Lovenox BID and PO iron supplementation.  All questions were answered. The patient knows to call the clinic with any problems, questions or concerns. We can certainly see the patient much sooner if necessary.  Patient and plan discussed with Dr. [name] and she is in agreement with the aforementioned.   This note is electronically signed by: Doy Mince 09/20/2014 9:46 AM

## 2014-09-20 NOTE — Assessment & Plan Note (Addendum)
Iron deficiency secondary to heavy menses in the setting of anticoagulation.  S/P IV Feraheme:    Oncology Flowsheet 02/17/2014 02/24/2014 03/03/2014 03/10/2014  ferumoxytol [facility]) IV 510 mg 510 mg 510 mg 510 mg   Currently on PO iron supplement.  Tolerating well.  Iron studies from today are pending.  Normalization of Hgb noted.  Labs today: CBC diff, iron/TIBC, ferritin  Labs in 2 months: CBC diff, iron/TIBC, ferritin.

## 2014-10-13 ENCOUNTER — Ambulatory Visit (INDEPENDENT_AMBULATORY_CARE_PROVIDER_SITE_OTHER): Payer: Medicaid Other

## 2014-10-13 ENCOUNTER — Ambulatory Visit (INDEPENDENT_AMBULATORY_CARE_PROVIDER_SITE_OTHER): Payer: Medicaid Other | Admitting: Obstetrics & Gynecology

## 2014-10-13 ENCOUNTER — Encounter: Payer: Self-pay | Admitting: Obstetrics & Gynecology

## 2014-10-13 VITALS — BP 140/80 | HR 80 | Wt 358.0 lb

## 2014-10-13 DIAGNOSIS — O162 Unspecified maternal hypertension, second trimester: Secondary | ICD-10-CM | POA: Diagnosis not present

## 2014-10-13 DIAGNOSIS — I2699 Other pulmonary embolism without acute cor pulmonale: Secondary | ICD-10-CM | POA: Diagnosis not present

## 2014-10-13 DIAGNOSIS — O09892 Supervision of other high risk pregnancies, second trimester: Secondary | ICD-10-CM

## 2014-10-13 DIAGNOSIS — O0942 Supervision of pregnancy with grand multiparity, second trimester: Secondary | ICD-10-CM

## 2014-10-13 DIAGNOSIS — Z1389 Encounter for screening for other disorder: Secondary | ICD-10-CM | POA: Diagnosis not present

## 2014-10-13 DIAGNOSIS — Z331 Pregnant state, incidental: Secondary | ICD-10-CM | POA: Diagnosis not present

## 2014-10-13 DIAGNOSIS — Z36 Encounter for antenatal screening of mother: Secondary | ICD-10-CM | POA: Diagnosis not present

## 2014-10-13 DIAGNOSIS — O09522 Supervision of elderly multigravida, second trimester: Secondary | ICD-10-CM

## 2014-10-13 DIAGNOSIS — I10 Essential (primary) hypertension: Secondary | ICD-10-CM

## 2014-10-13 LAB — POCT URINALYSIS DIPSTICK
Blood, UA: NEGATIVE
Glucose, UA: NEGATIVE
KETONES UA: NEGATIVE
LEUKOCYTES UA: NEGATIVE
Nitrite, UA: NEGATIVE
PROTEIN UA: NEGATIVE

## 2014-10-13 NOTE — Progress Notes (Signed)
[place] 20WKS,breech,fht 151 bpm,mult fibroids (#1) 7.7 x 7.6 x 5.1cm (#2) 10.9 x 9.6 x 10,post pl gr 0, cx 3.6,svp of fluid 6.7cm,limited ultrasound because of fibroids and pt body habitus,please have pt come back for additional images of  Heart,post fossa,face,gender ect.

## 2014-10-13 NOTE — Progress Notes (Signed)
Fetal Surveillance Testing today:  Sonogram suboptimal due to maternal habitus   High Risk Pregnancy Diagnosis(es):   Pulmonary embolus(04/2013), chronic hypertension,   G4P0030 [redacted]w[redacted]d  Estimated Date of Delivery: 03/02/15  Blood pressure 140/80, pulse 80, weight 358 lb (162.388 kg), last menstrual period 05/23/2014.  Urinalysis: Negative   HPI: The patient is being seen today for ongoing management of as above. Today she reports no problems   BP weight and urine results all reviewed and noted. Patient reports good fetal movement, denies any bleeding and no rupture of membranes symptoms or regular contractions.  Fundal Height: ?  Fetal Heart rate:  151 Edema:  none  Patient is without complaints other than noted in her HPI. All questions were answered.  All lab and sonogram results have been reviewed. Comments:    Assessment:  1.  Pregnancy at [redacted]w[redacted]d ,  Estimated Date of Delivery: 03/02/15 :                          2.  PE                        3.  Chronic hypertension  Medication(s) Plans:  No changes lovenox 150 SQ, amlodipine 2.5 mg daily  Treatment Plan:  Sonogram 28 weeks, then twice weekly at 32 weeks  Follow up in 4 weeks for appointment for high risk OB care, sonogram

## 2014-11-01 ENCOUNTER — Telehealth: Payer: Self-pay | Admitting: *Deleted

## 2014-11-02 ENCOUNTER — Telehealth: Payer: Self-pay | Admitting: *Deleted

## 2014-11-02 NOTE — Telephone Encounter (Signed)
Pt states went to [facility] due to swelling bilateral feet, pressure lower stomach only with ambulation, no vaginal bleeding, no discharge, +FM.   Pt informed to push fluids, decrease salt intake, and elevate extremities for swelling, can take tylenol for the stomach pain and pushing fluids can also help. If no improvement pt to call our office back. Pt verbalized understanding.

## 2014-11-02 NOTE — Telephone Encounter (Signed)
Duplicate message. 

## 2014-11-10 ENCOUNTER — Telehealth: Payer: Self-pay | Admitting: Obstetrics and Gynecology

## 2014-11-10 ENCOUNTER — Encounter: Payer: Self-pay | Admitting: Obstetrics and Gynecology

## 2014-11-10 ENCOUNTER — Ambulatory Visit (INDEPENDENT_AMBULATORY_CARE_PROVIDER_SITE_OTHER): Payer: Medicaid Other | Admitting: Obstetrics and Gynecology

## 2014-11-10 VITALS — BP 172/100 | HR 83 | Wt 363.0 lb

## 2014-11-10 DIAGNOSIS — Z1389 Encounter for screening for other disorder: Secondary | ICD-10-CM

## 2014-11-10 DIAGNOSIS — O09522 Supervision of elderly multigravida, second trimester: Secondary | ICD-10-CM | POA: Diagnosis not present

## 2014-11-10 DIAGNOSIS — O09892 Supervision of other high risk pregnancies, second trimester: Secondary | ICD-10-CM | POA: Diagnosis not present

## 2014-11-10 DIAGNOSIS — Z331 Pregnant state, incidental: Secondary | ICD-10-CM | POA: Diagnosis not present

## 2014-11-10 DIAGNOSIS — O162 Unspecified maternal hypertension, second trimester: Secondary | ICD-10-CM

## 2014-11-10 LAB — POCT URINALYSIS DIPSTICK
Blood, UA: NEGATIVE
GLUCOSE UA: NEGATIVE
Glucose, UA: NEGATIVE
KETONES UA: NEGATIVE
Ketones, UA: NEGATIVE
LEUKOCYTES UA: NEGATIVE
LEUKOCYTES UA: NEGATIVE
NITRITE UA: NEGATIVE
NITRITE UA: NEGATIVE
PROTEIN UA: NEGATIVE
Protein, UA: NEGATIVE
RBC UA: NEGATIVE

## 2014-11-10 MED ORDER — AMLODIPINE BESYLATE 10 MG PO TABS
10.0000 mg | ORAL_TABLET | Freq: Every day | ORAL | Status: DC
Start: 2014-11-10 — End: 2015-02-26

## 2014-11-10 NOTE — Progress Notes (Signed)
Pt states that she is still having pain in the bottom of her stomach.

## 2014-11-10 NOTE — Telephone Encounter (Signed)
Asked mother to have pt come back in am for bp recheck and review of labs. Labs still pending

## 2014-11-10 NOTE — Progress Notes (Addendum)
Patient ID: [name] [name], female   DOB: 31-Oct-1975, 39 y.o.   MRN: [id number]   High Risk Pregnancy Diagnosis(es):   Chronic HTN, PE last year  G4P0030 [redacted]w[redacted]d Estimated Date of Delivery: 03/02/15     HPI: The patient is being seen today for ongoing management of Chronic HTN and PE last year. Today she reports suprapubic abdominal soreness that she states feels like her muscles are being stretched out due to pregnancy. Patient is currently anticoagulated on Lovenox 150 mg BID, due to a PE that occurred last year. She was switched to Lovenox from Xarelto once she was found to be pregnant. She is also taking amlodipine 2.5 mg qd.  She denies headaches, blurred vision, or any other visual disturbances.  Patient reports good fetal movement, denies any bleeding and no rupture of membranes symptoms or regular contractions.   BP weight and urine results all reviewed and noted. Blood pressure 172/100, pulse 83, weight 363 lb (164.656 kg), last menstrual period 05/23/2014.  Fetal Surveillance Testing today:  None Fundal Height: 37 cm Fetal Heart rate: 138 bpm Edema: n/a  Urinalysis: Negative   Questions were answered.   Comments: not done   Assessment:  1.  Pregnancy at [redacted]w[redacted]d,  Estimated Date of Delivery: 03/02/15 :                          2.  Chronic HTN, uncontrolled.                        3.  Morbid Obesity.   4. Normal abdominal soreness due to pregnancy Medication(s) Plans:  Increase amlodipine 2.5 mg to 10 mg. Continue Lovenox 150 mg BID.   Treatment Plan:     1. Increase amlodipine 2.5 mg to 10 mg.   2. Continue to monitor blood pressure. Discussed with pt that chronic HTN increases risk for pre-eclampsia.    3.  Pt and FOB (also morbidly obese) strongly encouraged on counseled on the importance of and strategies for weight loss, for a total time of 10 minutes.   4. Pt advised to go to [facility] for any urgent issues.    5. Advised abdominal binder to alleviate abdominal soreness.   6  will recheck bp in am and begin 24 hr TP.   Follow up in 4 days changed to 1 day, for appointment for high risk OB care, check BP at that time     By signing my name below, I, Stephania Fragmin, attest that this documentation has been prepared under the direction and in the presence of Jonnie Kind, MD. Electronically Signed: Stephania Fragmin, ED Scribe. 11/10/2014. 11:20 AM.   I personally performed the services described in this documentation, which was SCRIBED in my presence. The recorded information has been reviewed and considered accurate. It has been edited as necessary during review. Jonnie Kind, MD

## 2014-11-11 ENCOUNTER — Ambulatory Visit (INDEPENDENT_AMBULATORY_CARE_PROVIDER_SITE_OTHER): Payer: Medicaid Other | Admitting: Obstetrics and Gynecology

## 2014-11-11 ENCOUNTER — Encounter: Payer: Self-pay | Admitting: Obstetrics and Gynecology

## 2014-11-11 VITALS — BP 146/80 | HR 84 | Wt 363.0 lb

## 2014-11-11 DIAGNOSIS — I2699 Other pulmonary embolism without acute cor pulmonale: Secondary | ICD-10-CM | POA: Diagnosis not present

## 2014-11-11 DIAGNOSIS — O09892 Supervision of other high risk pregnancies, second trimester: Secondary | ICD-10-CM

## 2014-11-11 DIAGNOSIS — O09522 Supervision of elderly multigravida, second trimester: Secondary | ICD-10-CM

## 2014-11-11 DIAGNOSIS — Z331 Pregnant state, incidental: Secondary | ICD-10-CM

## 2014-11-11 DIAGNOSIS — O162 Unspecified maternal hypertension, second trimester: Secondary | ICD-10-CM | POA: Diagnosis not present

## 2014-11-11 DIAGNOSIS — Z1389 Encounter for screening for other disorder: Secondary | ICD-10-CM | POA: Diagnosis not present

## 2014-11-11 LAB — COMPREHENSIVE METABOLIC PANEL
ALK PHOS: 57 IU/L (ref 39–117)
ALT: 15 IU/L (ref 0–32)
AST: 11 IU/L (ref 0–40)
Albumin/Globulin Ratio: 1 — ABNORMAL LOW (ref 1.1–2.5)
Albumin: 3.6 g/dL (ref 3.5–5.5)
BILIRUBIN TOTAL: 0.3 mg/dL (ref 0.0–1.2)
BUN/Creatinine Ratio: 10 (ref 8–20)
BUN: 6 mg/dL (ref 6–20)
CHLORIDE: 98 mmol/L (ref 97–106)
CO2: 23 mmol/L (ref 18–29)
Calcium: 9.6 mg/dL (ref 8.7–10.2)
Creatinine, Ser: 0.58 mg/dL (ref 0.57–1.00)
GFR calc Af Amer: 135 mL/min/{1.73_m2} (ref >59)
GFR, EST NON AFRICAN AMERICAN: 117 mL/min/{1.73_m2} (ref >59)
GLUCOSE: 80 mg/dL (ref 65–99)
Globulin, Total: 3.6 g/dL (ref 1.5–4.5)
POTASSIUM: 4.7 mmol/L (ref 3.5–5.2)
SODIUM: 135 mmol/L — AB (ref 136–144)
Total Protein: 7.2 g/dL (ref 6.0–8.5)

## 2014-11-11 LAB — POCT URINALYSIS DIPSTICK
GLUCOSE UA: NEGATIVE
Ketones, UA: NEGATIVE
Leukocytes, UA: NEGATIVE
Nitrite, UA: NEGATIVE
Protein, UA: NEGATIVE
RBC UA: NEGATIVE

## 2014-11-11 LAB — CBC
Hematocrit: 38.5 % (ref 34.0–46.6)
Hemoglobin: 12.6 g/dL (ref 11.1–15.9)
MCH: 31 pg (ref 26.6–33.0)
MCHC: 32.7 g/dL (ref 31.5–35.7)
MCV: 95 fL (ref 79–97)
PLATELETS: 243 10*3/uL (ref 150–379)
RBC: 4.06 x10E6/uL (ref 3.77–5.28)
RDW: 14.8 % (ref 12.3–15.4)
WBC: 5.6 10*3/uL (ref 3.4–10.8)

## 2014-11-11 NOTE — Telephone Encounter (Signed)
Pt seen in office today and has a return appointment on Monday to follow up.

## 2014-11-11 NOTE — Progress Notes (Signed)
Patient ID: [name] [name], female   DOB: 07-19-1975, 39 y.o.   MRN: [id number]   High Risk Pregnancy Diagnosis(es):   CHTN  G4P0030 [redacted]w[redacted]d Estimated Date of Delivery: 03/02/15     HPI: The patient is being seen today for ongoing management of CHTN Today she presents for a recheck of blood pressure after being seen by me yesterday. I had increased her from 2.5 mg amlodipine qd to 10 mg qd.  Patient reports good fetal movement, denies any bleeding and no rupture of membranes symptoms or regular contractions.   BP weight and urine results all reviewed and noted. Blood pressure 146/80, pulse 84, weight 363 lb (164.656 kg), last menstrual period 05/23/2014.  Fetal Surveillance Testing today:  none Fundal Height:  n/a Fetal Heart rate:  n/a Edema:  n/a Urinalysis: Negative   Questions were answered.  Lab and sonogram results have been reviewed. Comments: not done   Assessment:  1.  Pregnancy at [redacted]w[redacted]d,  Estimated Date of Delivery: 03/02/15 :                          2.  CHTN, improved with increase of amlodipine from 2.5 mg to 10 mg yesterday  Medication(s) Plans:  Continue amlodipine 10 mg qd, Lovenox 150 mg that is unchanged  Treatment Plan:  24 hour urine protein to be collected over weekend  Follow up in  weeks for appointment for high risk OB care,      By signing my name below, I, Stephania Fragmin, attest that this documentation has been prepared under the direction and in the presence of Jonnie Kind, MD. Electronically Signed: Stephania Fragmin, ED Scribe. 11/11/2014. 11:08 AM.   I personally performed the services described in this documentation, which was SCRIBED in my presence. The recorded information has been reviewed and considered accurate. It has been edited as necessary during review. Jonnie Kind, MD   (scribe attestation statement)

## 2014-11-11 NOTE — Progress Notes (Signed)
Pt here today for follow up BP check.

## 2014-11-14 ENCOUNTER — Encounter: Payer: Medicaid Other | Admitting: Obstetrics & Gynecology

## 2014-11-14 ENCOUNTER — Ambulatory Visit (INDEPENDENT_AMBULATORY_CARE_PROVIDER_SITE_OTHER): Payer: Medicaid Other | Admitting: Obstetrics and Gynecology

## 2014-11-14 ENCOUNTER — Encounter: Payer: Self-pay | Admitting: Obstetrics and Gynecology

## 2014-11-14 VITALS — BP 130/80 | HR 98 | Wt 361.4 lb

## 2014-11-14 DIAGNOSIS — I2699 Other pulmonary embolism without acute cor pulmonale: Secondary | ICD-10-CM | POA: Diagnosis not present

## 2014-11-14 DIAGNOSIS — O162 Unspecified maternal hypertension, second trimester: Secondary | ICD-10-CM | POA: Diagnosis not present

## 2014-11-14 DIAGNOSIS — Z1389 Encounter for screening for other disorder: Secondary | ICD-10-CM

## 2014-11-14 DIAGNOSIS — O09522 Supervision of elderly multigravida, second trimester: Secondary | ICD-10-CM | POA: Diagnosis not present

## 2014-11-14 DIAGNOSIS — Z331 Pregnant state, incidental: Secondary | ICD-10-CM

## 2014-11-14 DIAGNOSIS — O09892 Supervision of other high risk pregnancies, second trimester: Secondary | ICD-10-CM | POA: Diagnosis not present

## 2014-11-14 LAB — POCT URINALYSIS DIPSTICK
Blood, UA: NEGATIVE
Glucose, UA: NEGATIVE
KETONES UA: NEGATIVE
Leukocytes, UA: NEGATIVE
Nitrite, UA: NEGATIVE
PROTEIN UA: NEGATIVE

## 2014-11-14 NOTE — Progress Notes (Signed)
High Risk Pregnancy Diagnosis(es):   CHTN, recent PE on Therapeutic Lovenox bid, uterine fibroids, morbid obesity.  Blood pressure 130/80, pulse 98, weight 163.93 kg (361 lb 6.4 oz), last menstrual period 05/23/2014. HPI: [id number] [redacted]w[redacted]d Estimated Date of Delivery: 03/02/15     Pt feels fine, stable on Norvasc 10 qd.     BP weight and urine results reviewed and notable for urine 24 hr protein pending.. Patient reports    good fetal movement, denies any bleeding and no rupture of membranes symptoms or regular contractions . Edema:  neg Urinalysis: Negative  .Assessment:[redacted]w[redacted]d,   Chronic htn stable on Norvasc 10 qd  Medication(s) Plans:  Norvasc 10 qd  Treatment Plan:        Baseline 24 hr TP pending                                   Biweekly testing at 32wk                                   Serial u/s growth 28/32/35/38 wk  Follow up:   2 weeks for  pnx All questions were answered.

## 2014-11-15 LAB — PROTEIN, URINE, 24 HOUR
PROTEIN UR: 26.2 mg/dL
Protein, 24H Urine: 445.4 mg/24 hr — ABNORMAL HIGH (ref 30.0–150.0)

## 2014-11-19 NOTE — Progress Notes (Signed)
[name] [name]  Addendum Obstetrics/Gynecology Progress Notes 11/10/2014 10:45 AM    Expand All Collapse All   Patient ID: [name] [name], female DOB: 1975-10-31, 39 y.o. MRN: [id number]  High Risk Pregnancy Diagnosis(es): Chronic HTN, PE last year  G4P0030 [redacted]w[redacted]d Estimated Date of Delivery: 03/02/15    HPI: The patient is being seen today for ongoing management of Chronic HTN and PE last year. Today she reports suprapubic abdominal soreness that she states feels like her muscles are being stretched out due to pregnancy. Patient is currently anticoagulated on Lovenox 150 mg BID, due to a PE that occurred last year. She was switched to Lovenox from Xarelto once she was found to be pregnant. She is also taking amlodipine 2.5 mg qd.  She denies headaches, blurred vision, or any other visual disturbances.  Patient reports good fetal movement, denies any bleeding and no rupture of membranes symptoms or regular contractions.   BP weight and urine results all reviewed and noted. Blood pressure 172/100, pulse 83, weight 363 lb (164.656 kg), last menstrual period 05/23/2014.  Fetal Surveillance Testing today: None Fundal Height: 37 cm Fetal Heart rate: 138 bpm Edema: n/a  Urinalysis: Negative   Questions were answered.  Comments: not done   Assessment: 1. Pregnancy at [redacted]w[redacted]d, Estimated Date of Delivery: 03/02/15 :   2. Chronic HTN, uncontrolled.  3. Morbid Obesity. 4. Normal abdominal soreness due to pregnancy Medication(s) Plans: Increase amlodipine 2.5 mg to 10 mg. Continue Lovenox 150 mg BID.   Treatment Plan:  1. Increase amlodipine 2.5 mg to 10 mg. 2. Continue to monitor blood pressure. Discussed with pt that chronic HTN increases risk for pre-eclampsia.  3. Pt and FOB (also morbidly obese) strongly encouraged on counseled on  the importance of and strategies for weight loss, for a total time of 10 minutes. 4. Pt advised to go to [facility] for any urgent issues. 5. Advised abdominal binder to alleviate abdominal soreness. 6 will recheck bp in am and begin 24 hr TP.  Follow up in 4 days changed to 1 day, for appointment for high risk OB care, check BP at that time    Late entry completion of note from 10.20 jvf

## 2014-11-20 NOTE — Assessment & Plan Note (Deleted)
Iron deficiency secondary to heavy menses in the setting of anticoagulation.  S/P IV Feraheme:    Oncology Flowsheet 02/17/2014 02/24/2014 03/03/2014 03/10/2014  ferumoxytol [facility]) IV 510 mg 510 mg 510 mg 510 mg   Currently on PO iron supplement.  Tolerating well.  Iron studies from today are pending.  Normalization of Hgb noted.  Labs today: CBC diff, iron/TIBC, ferritin  Labs in 2 months: CBC diff, iron/TIBC, ferritin.

## 2014-11-20 NOTE — Progress Notes (Signed)
Rescheduled

## 2014-11-20 NOTE — Assessment & Plan Note (Deleted)
history of PE with an IVC filter placed on 04/29/2013 with a history of saddle embolism on CT imaging on 04/27/2013 and 2 miscarriages at 9 and 6 weeks, currently on Lovenox anticoagulation in the setting of high risk pregnancy.  Labs today: CBC diff  CBC diff today is stable with a normal Hgb, WBC, and platelet count.  Labs in 2 months: CBC diff.  Return in 2 months for follow-up.

## 2014-11-21 ENCOUNTER — Ambulatory Visit (HOSPITAL_COMMUNITY): Payer: Self-pay | Admitting: Oncology

## 2014-11-21 ENCOUNTER — Other Ambulatory Visit (HOSPITAL_COMMUNITY): Payer: Self-pay

## 2014-11-28 ENCOUNTER — Ambulatory Visit (INDEPENDENT_AMBULATORY_CARE_PROVIDER_SITE_OTHER): Payer: Medicaid Other | Admitting: Obstetrics and Gynecology

## 2014-11-28 ENCOUNTER — Encounter: Payer: Self-pay | Admitting: Obstetrics and Gynecology

## 2014-11-28 VITALS — BP 146/84 | HR 88 | Wt 376.0 lb

## 2014-11-28 DIAGNOSIS — Z331 Pregnant state, incidental: Secondary | ICD-10-CM

## 2014-11-28 DIAGNOSIS — O09522 Supervision of elderly multigravida, second trimester: Secondary | ICD-10-CM | POA: Diagnosis not present

## 2014-11-28 DIAGNOSIS — Z1389 Encounter for screening for other disorder: Secondary | ICD-10-CM

## 2014-11-28 DIAGNOSIS — O162 Unspecified maternal hypertension, second trimester: Secondary | ICD-10-CM | POA: Diagnosis not present

## 2014-11-28 DIAGNOSIS — O09892 Supervision of other high risk pregnancies, second trimester: Secondary | ICD-10-CM | POA: Diagnosis not present

## 2014-11-28 LAB — POCT URINALYSIS DIPSTICK
Blood, UA: NEGATIVE
Glucose, UA: NEGATIVE
Ketones, UA: NEGATIVE
LEUKOCYTES UA: NEGATIVE
NITRITE UA: NEGATIVE
Protein, UA: NEGATIVE

## 2014-11-28 NOTE — Progress Notes (Signed)
Patient ID: [name] [name], female   DOB: 09-Sep-1975, 39 y.o.   MRN: [id number]  High Risk Pregnancy Diagnosis(es): Chronic HTN, PE last year  G4P0030 [redacted]w[redacted]d Estimated Date of Delivery: 03/02/15    HPI: The patient is being seen today for ongoing management of Chronic HTN and PE last year. Today she reports no complaints.  Patient reports good fetal movement, denies any bleeding and no rupture of membranes symptoms or regular contractions.  BP weight and urine results all reviewed and noted. Blood pressure 146/84, pulse 88, weight 376 lb (170.552 kg), last menstrual period 05/23/2014.  Fetal Surveillance Testing today: None Fundal Height:  41cm enlarged by obesity, body habitus and also uterine fibroids. Fetal Heart rate:144 Edema:  N/A Urinalysis: Negative  Questions were answered.  Lab and sonogram results have been reviewed. Comments: not done   Assessment:  1.  Pregnancy at [redacted]w[redacted]d,  Estimated Date of Delivery: 03/02/15 :  Size>dates due to habitus & Fibroids.                        2.  Chronic HTN, uncontrolled.  3. Morbid Obesity.                        3.  Status post PE on Lovenox      Medication(s) Plans:  No changes   Treatment Plan:   1. Follow up in 1 weeks for appointment for high risk OB care and ultrasound.  2. Pt strongly encouraged and counseled on the importance of and strategies for weight loss, for a total time of 10 minutes.   By signing my name below, I, Terressa Koyanagi, attest that this documentation has been prepared under the direction and in the presence of Mallory Shirk, MD. Electronically Signed: Terressa Koyanagi, ED Scribe. 11/28/2014. 10:09 AM.   I personally performed the services described in this documentation, which was SCRIBED in my presence. The recorded information has been reviewed and considered accurate. It has been edited as necessary during review. Jonnie Kind, MD

## 2014-11-28 NOTE — Assessment & Plan Note (Addendum)
history of PE with an IVC filter placed on 04/29/2013 with a history of saddle embolism on CT imaging on 04/27/2013 and 2 miscarriages at 9 and 6 weeks, currently on Lovenox anticoagulation in the setting of high risk pregnancy.  Continue Lovenox as ordered.  Labs today: CBC, ferritin  Labs in 6 weeks: CBC diff, ferritin  Return in 6 weeks for follow-up.

## 2014-11-28 NOTE — Assessment & Plan Note (Addendum)
Iron deficiency secondary to heavy menses in the setting of anticoagulation.  S/P IV Feraheme:    Oncology Flowsheet 02/17/2014 02/24/2014 03/03/2014 03/10/2014  ferumoxytol [facility]) IV 510 mg 510 mg 510 mg 510 mg   Currently on PO iron supplement.  Tolerating well.  Iron studies from today are pending.  Normalization of Hgb noted.

## 2014-11-28 NOTE — Progress Notes (Signed)
PROVIDER NOT IN SYSTEM No address on file  Acute saddle pulmonary embolism with acute cor pulmonale (HCC) - Plan: CBC with Differential, CBC  Iron deficiency anemia due to chronic blood loss - Plan: CBC with Differential, Iron and TIBC, Ferritin, CBC, Ferritin  CURRENT THERAPY: Lovenox 150 mg BID anticoagulation prescribed on 09/07/2014 after being on Xarelto.  INTERVAL HISTORY: [name] [name] 39 y.o. female returns for followup of history of PE with an IVC filter placed on 04/29/2013 with a history of saddle embolism on CT imaging on 04/27/2013 and 2 miscarriages at 9 and 6 weeks. AND Iron deficiency secondary to heavy menses on anticoagulation. AND High risk pregnancy  I personally reviewed and went over laboratory results with the patient.  The results are noted within this dictation.  WBC, Hgb, and platelet count are stable and WNL.  She is tolerating PO iron well without any complaints or issues at this time.   She continues with Lovenox BID dosing.  Other than bruising she is tolerating well without any signs or symptoms of active blood loss.  She is 26 weeks, 5 days pregnant with estimated delivery date being 03/02/15  She is accompanied by her mother today.  "Is there a pill form for these shots?"  The patient's mother is educated on anticoagulation in the setting of pregnancy.  The short answer is yes, but we can't do it given her pregnancy at this time.   Past Medical History  Diagnosis Date  . Closed left ankle fracture     August 30 2012  . Obesity   . Tobacco use disorder   . High blood pressure   . Pulmonary embolism ([place]) 04/2013  . OSA on CPAP   . Pregnancy     09/20/14 17 weeks    has PE (pulmonary embolism); Pleuritic chest pain; SOB (shortness of breath); Hyperglycemia; Anemia; Tobacco use disorder; Saddle embolus of pulmonary artery with acute cor pulmonale ([place]); Secondary pulmonary hypertension ([place]); Obstructive sleep apnea; Pulmonary embolism  ([place]); Back pain with left-sided radiculopathy; Iron deficiency anemia due to chronic blood loss; Supervision of other high-risk pregnancy; Benign essential hypertension; Rubella non-immune status, antepartum; High risk multigravida; Morbid obesity ([place]); Fibroid, uterine; AMA (advanced maternal age) multigravida 77+; and Hypertension affecting pregnancy on her problem list.     is allergic to sulfa antibiotics.  Current Outpatient Prescriptions on File Prior to Visit  Medication Sig Dispense Refill  . amLODipine (NORVASC) 10 MG tablet Take 1 tablet (10 mg total) by mouth daily. 30 tablet 3  . enoxaparin (LOVENOX) 150 MG/ML injection Inject 1.04 mLs (155 mg total) into the skin every 12 (twelve) hours. 60 Syringe 6  . Iron Polysacch Cmplx-B12-FA 150-0.025-1 MG CAPS Take 1 capsule by mouth daily. 30 each 6  . Prenatal Vit-Fe Fumarate-FA (PRENATAL COMPLETE) 14-0.4 MG TABS Take 1 tablet by mouth daily. 100 each 3   No current facility-administered medications on file prior to visit.    No past surgical history on file.  Denies any headaches, dizziness, double vision, fevers, chills, night sweats, nausea, vomiting, diarrhea, constipation, chest pain, heart palpitations, shortness of breath, blood in stool, black tarry stool, urinary pain, urinary burning, urinary frequency, hematuria.   PHYSICAL EXAMINATION  ECOG PERFORMANCE STATUS: 0 - Asymptomatic  Filed Vitals:   11/29/14 1057  BP: 136/53  Pulse: 81  Temp: 97.9 F (36.6 C)  Resp: 20    GENERAL:alert, no distress, well nourished, well developed, comfortable, cooperative, obese and  smiling SKIN: skin color, texture, turgor are normal, no rashes or significant lesions HEAD: Normocephalic, No masses, lesions, tenderness or abnormalities EYES: normal, PERRLA, EOMI, Conjunctiva are pink and non-injected EARS: External ears normal OROPHARYNX:lips, buccal mucosa, and tongue normal and mucous membranes are moist  NECK: supple, no  adenopathy, thyroid normal size, non-tender, without nodularity, trachea midline LYMPH:  not examined BREAST:not examined LUNGS: clear to auscultation  HEART: regular rate & rhythm ABDOMEN:normal bowel sounds BACK: Back symmetric, no curvature. EXTREMITIES:less then 2 second capillary refill, no joint deformities, effusion, or inflammation, no skin discoloration, no cyanosis  NEURO: alert & oriented x 3 with fluent speech, no focal motor/sensory deficits   LABORATORY DATA: CBC    Component Value Date/Time   WBC 5.6 11/10/2014 1141   WBC 4.8 09/20/2014 0922   RBC 4.06 11/10/2014 1141   RBC 3.96 09/20/2014 0922   HGB 12.4 09/20/2014 0922   HCT 38.5 11/10/2014 1141   HCT 36.5 09/20/2014 0922   PLT 204 09/20/2014 0922   MCV 92.2 09/20/2014 0922   MCH 31.0 11/10/2014 1141   MCH 31.3 09/20/2014 0922   MCHC 32.7 11/10/2014 1141   MCHC 34.0 09/20/2014 0922   RDW 14.8 11/10/2014 1141   RDW 14.5 09/20/2014 0922   LYMPHSABS 1.1 09/20/2014 0922   MONOABS 0.4 09/20/2014 0922   EOSABS 0.1 09/20/2014 0922   BASOSABS 0.0 09/20/2014 0922      Chemistry      Component Value Date/Time   NA 135* 11/10/2014 1141   NA 139 09/20/2014 0922   K 4.7 11/10/2014 1141   CL 98 11/10/2014 1141   CO2 23 11/10/2014 1141   BUN 6 11/10/2014 1141   BUN 6 09/20/2014 0922   CREATININE 0.58 11/10/2014 1141      Component Value Date/Time   CALCIUM 9.6 11/10/2014 1141   ALKPHOS 57 11/10/2014 1141   AST 11 11/10/2014 1141   ALT 15 11/10/2014 1141   BILITOT 0.3 11/10/2014 1141   BILITOT 0.5 09/20/2014 0922     Lab Results  Component Value Date   IRON 93 09/20/2014   TIBC 398 09/20/2014   FERRITIN 23 09/20/2014     PENDING LABS:   RADIOGRAPHIC STUDIES:  No results found.   PATHOLOGY:    ASSESSMENT AND PLAN:  Saddle embolus of pulmonary artery with acute cor pulmonale history of PE with an IVC filter placed on 04/29/2013 with a history of saddle embolism on CT imaging on 04/27/2013 and 2  miscarriages at 9 and 6 weeks, currently on Lovenox anticoagulation in the setting of high risk pregnancy.  Continue Lovenox as ordered.  Labs today: CBC, ferritin  Labs in 6 weeks: CBC diff, ferritin  Return in 6 weeks for follow-up.  Iron deficiency anemia due to chronic blood loss Iron deficiency secondary to heavy menses in the setting of anticoagulation.  S/P IV Feraheme:    Oncology Flowsheet 02/17/2014 02/24/2014 03/03/2014 03/10/2014  ferumoxytol [facility]) IV 510 mg 510 mg 510 mg 510 mg   Currently on PO iron supplement.  Tolerating well.  Iron studies from today are pending.  Normalization of Hgb noted.     THERAPY PLAN:  Continue with Lovenox BID and PO iron supplementation.  All questions were answered. The patient knows to call the clinic with any problems, questions or concerns. We can certainly see the patient much sooner if necessary.  Patient and plan discussed with Dr. [name] and she is in agreement with the aforementioned.   This note  is electronically signed by: Doy Mince 11/29/2014 11:25 AM

## 2014-11-28 NOTE — Progress Notes (Signed)
Pt denies any problems or concerns at this time.  

## 2014-11-29 ENCOUNTER — Encounter (HOSPITAL_COMMUNITY): Payer: Medicaid Other

## 2014-11-29 ENCOUNTER — Encounter (HOSPITAL_COMMUNITY): Payer: Medicaid Other | Attending: Hematology & Oncology | Admitting: Oncology

## 2014-11-29 VITALS — BP 136/53 | HR 81 | Temp 97.9°F | Resp 20 | Wt 375.8 lb

## 2014-11-29 DIAGNOSIS — Z7901 Long term (current) use of anticoagulants: Secondary | ICD-10-CM

## 2014-11-29 DIAGNOSIS — D500 Iron deficiency anemia secondary to blood loss (chronic): Secondary | ICD-10-CM

## 2014-11-29 DIAGNOSIS — D509 Iron deficiency anemia, unspecified: Secondary | ICD-10-CM | POA: Insufficient documentation

## 2014-11-29 DIAGNOSIS — I2602 Saddle embolus of pulmonary artery with acute cor pulmonale: Secondary | ICD-10-CM | POA: Diagnosis not present

## 2014-11-29 LAB — CBC
HCT: 35.3 % — ABNORMAL LOW (ref 36.0–46.0)
Hemoglobin: 11.9 g/dL — ABNORMAL LOW (ref 12.0–15.0)
MCH: 31.9 pg (ref 26.0–34.0)
MCHC: 33.7 g/dL (ref 30.0–36.0)
MCV: 94.6 fL (ref 78.0–100.0)
PLATELETS: 214 10*3/uL (ref 150–400)
RBC: 3.73 MIL/uL — ABNORMAL LOW (ref 3.87–5.11)
RDW: 14.3 % (ref 11.5–15.5)
WBC: 6.1 10*3/uL (ref 4.0–10.5)

## 2014-11-29 LAB — FERRITIN: FERRITIN: 22 ng/mL (ref 11–307)

## 2014-11-29 NOTE — Patient Instructions (Signed)
[place] at [facility] Discharge Instructions  RECOMMENDATIONS MADE BY THE CONSULTANT AND ANY TEST RESULTS WILL BE SENT TO YOUR REFERRING PHYSICIAN.  Lab work today. We will call you if there are any abnormal results. Continue Lovenox injections as directed. Lab work and MD appointment again in 6 weeks. Return as scheduled.  Thank you for choosing [place] at [facility] to provide your oncology and hematology care.  To afford each patient quality time with our provider, please arrive at least 15 minutes before your scheduled appointment time.    You need to re-schedule your appointment should you arrive 10 or more minutes late.  We strive to give you quality time with our providers, and arriving late affects you and other patients whose appointments are after yours.  Also, if you no show three or more times for appointments you may be dismissed from the clinic at the providers discretion.     Again, thank you for choosing [facility].  Our hope is that these requests will decrease the amount of time that you wait before being seen by our physicians.       _____________________________________________________________  Should you have questions after your visit to [facility], please contact our office at [phone] between the hours of 8:30 a.m. and 4:30 p.m.  Voicemails left after 4:30 p.m. will not be returned until the following business day.  For prescription refill requests, have your pharmacy contact our office.

## 2014-12-01 ENCOUNTER — Other Ambulatory Visit: Payer: Self-pay | Admitting: Obstetrics and Gynecology

## 2014-12-01 DIAGNOSIS — O162 Unspecified maternal hypertension, second trimester: Secondary | ICD-10-CM

## 2014-12-01 DIAGNOSIS — O3660X0 Maternal care for excessive fetal growth, unspecified trimester, not applicable or unspecified: Secondary | ICD-10-CM

## 2014-12-05 ENCOUNTER — Ambulatory Visit (INDEPENDENT_AMBULATORY_CARE_PROVIDER_SITE_OTHER): Payer: Medicaid Other | Admitting: Obstetrics and Gynecology

## 2014-12-05 ENCOUNTER — Ambulatory Visit (INDEPENDENT_AMBULATORY_CARE_PROVIDER_SITE_OTHER): Payer: Medicaid Other

## 2014-12-05 ENCOUNTER — Encounter: Payer: Self-pay | Admitting: Obstetrics and Gynecology

## 2014-12-05 VITALS — BP 158/92 | HR 84 | Wt 374.0 lb

## 2014-12-05 DIAGNOSIS — O162 Unspecified maternal hypertension, second trimester: Secondary | ICD-10-CM

## 2014-12-05 DIAGNOSIS — O09522 Supervision of elderly multigravida, second trimester: Secondary | ICD-10-CM | POA: Diagnosis not present

## 2014-12-05 DIAGNOSIS — Z1389 Encounter for screening for other disorder: Secondary | ICD-10-CM | POA: Diagnosis not present

## 2014-12-05 DIAGNOSIS — O3660X0 Maternal care for excessive fetal growth, unspecified trimester, not applicable or unspecified: Secondary | ICD-10-CM | POA: Diagnosis not present

## 2014-12-05 DIAGNOSIS — O09892 Supervision of other high risk pregnancies, second trimester: Secondary | ICD-10-CM

## 2014-12-05 DIAGNOSIS — Z331 Pregnant state, incidental: Secondary | ICD-10-CM

## 2014-12-05 DIAGNOSIS — I10 Essential (primary) hypertension: Secondary | ICD-10-CM

## 2014-12-05 LAB — POCT URINALYSIS DIPSTICK
Glucose, UA: NEGATIVE
KETONES UA: NEGATIVE
Leukocytes, UA: NEGATIVE
Nitrite, UA: NEGATIVE
PROTEIN UA: NEGATIVE
RBC UA: NEGATIVE

## 2014-12-05 MED ORDER — LABETALOL HCL 200 MG PO TABS: 200.0000 mg | ORAL_TABLET | Freq: Two times a day (BID) | ORAL | Status: DC

## 2014-12-05 NOTE — Progress Notes (Signed)
[place] 27+4wks,fhr 147 bpm,cx 4.4cm,post pl gr 0,svp of fluid 5.6cm,efw 1207g 63%,two large fibroids  (#1)13.3 x 11 x 10.5cm (#2) 7.3 x 7.5 x 5.7cm,unable to see lt ov,simple cyst rt ov 4.4 x 3.8 x 2.8cm,unable to see fetal heart anatomy,all other anatomy complete,limited ultrasound because of pt body habitus

## 2014-12-05 NOTE — Progress Notes (Signed)
Patient ID: [name] [name], female   DOB: 1975-03-05, 39 y.o.   MRN: [id number]   High Risk Pregnancy Diagnosis(es):   Chronic HTN and PE last year  G4P0030 [redacted]w[redacted]d Estimated Date of Delivery: 03/02/15  Blood pressure 158/92, pulse 84, weight 374 lb (169.645 kg), last menstrual period 05/23/2014.  Urinalysis: Negative  HPI: Pt reports intermittent pelvic pressure. She is currently taking amlodipine-10 mg for HTN without any issues. Pt denies contractions, abdominal pain, vaginal bleeding and membrane rupture.  BP weight and urine results all reviewed and noted. Patient reports good fetal movement, denies any bleeding and no rupture of membranes symptoms or regular contractions.  Fundal Height: 42 cm, enlarged by obesity, body habitus and also uterine fibroids.  Patient is without complaints. All questions were answered.  Assessment:  [redacted]w[redacted]d,   G4P0030 1.  Estimated Date of Delivery: 03/02/15 : Size>dates due to habitus & Fibroids. 2. Chronic HTN, uncontrolled. BP today 158/92. Will ADD SECOND AGENT, LABETALOL 200 BID. 3. Morbid Obesity, BOTH Pt and partner. Discussed weight loss strategies with husband (and pt) for 10 minutes. 4. Status post PE on Lovenox   Medication(s) Plans:  Continue amlodipine-10 mg as prescribed. Pt to start on Labetalol-200 mg BID.  Treatment Plan:  Continue routine pre-natal care, will re-evaluate pt's HTN in 1 week and every week thereafter until BP controlled. Pt to start Fitness Pal for activity documentation and calorie counting.   Follow up in 1 week for OB appt and monitor BP. Continue routine pre-natal care.   By signing my name below, I, Tula Nakayama, attest that this documentation has been prepared under the direction and in the presence of Jonnie Kind, MD.  Electronically Signed: Tula Nakayama, ED Scribe. 12/05/2014. 10:04 AM.  I personally performed the services described in this documentation, which was SCRIBED in my presence. The  recorded information has been reviewed and considered accurate. It has been edited as necessary during review. Jonnie Kind, MD

## 2014-12-05 NOTE — Progress Notes (Signed)
Pt denies any problems or concerns at this time.  

## 2014-12-12 ENCOUNTER — Ambulatory Visit (INDEPENDENT_AMBULATORY_CARE_PROVIDER_SITE_OTHER): Payer: Medicaid Other | Admitting: Obstetrics and Gynecology

## 2014-12-12 ENCOUNTER — Encounter: Payer: Medicaid Other | Admitting: Obstetrics and Gynecology

## 2014-12-12 ENCOUNTER — Encounter: Payer: Self-pay | Admitting: Obstetrics and Gynecology

## 2014-12-12 VITALS — BP 134/88 | HR 70 | Wt 378.5 lb

## 2014-12-12 DIAGNOSIS — O10913 Unspecified pre-existing hypertension complicating pregnancy, third trimester: Secondary | ICD-10-CM

## 2014-12-12 DIAGNOSIS — O09893 Supervision of other high risk pregnancies, third trimester: Secondary | ICD-10-CM

## 2014-12-12 NOTE — Addendum Note (Signed)
Addended by: Farley Ly on: 12/12/2014 04:48 PM   Modules accepted: Orders

## 2014-12-12 NOTE — Progress Notes (Signed)
Patient ID: [name] [name], female   DOB: [birth year], 39 y.o.   MRN: [id number]   High Risk Pregnancy Diagnosis(es):   Chronic HTN and PE last year baseline proteinuria.  [id number] [redacted]w[redacted]d Estimated Date of Delivery: 03/02/15     HPI: The patient is being seen today for ongoing management of Chronic HTN and PE last year. Today she reports no complaints. Pt specifically denies vaginal bleeding, vaginal discharge, headache, blurred vision, abd pain, or contractions.  Patient reports good fetal movement, denies any bleeding and no rupture of membranes symptoms or regular contractions.   BP weight and urine results all reviewed and noted.no h/a or clonus Blood pressure 134/88, pulse 70, weight 378 lb 8 oz (171.686 kg), last menstrual period 05/23/2014.  Fetal Surveillance Testing today:  none Fundal Height:  0000000 to the umbilicus, 123XX123 total  Fetal Heart rate:  150 Edema:  Present bilaterally. Reflexes 2+,  Urinalysis: Negative   Questions were answered.  Lab and sonogram results have been reviewed. Comments: not done  Reflexes 2+.   Assessment:  1.  Pregnancy at [redacted]w[redacted]d,  Estimated Date of Delivery: 03/02/15 :                          2.  CHTN, baseline proteinuria                         3.  Hx saddle embolus with Anticoagulation Lovenox   Medication(s) Plans:  No changes labetalol 200bid, amylodipine 10 qd  Treatment Plan:  1. CBC  24 hr TP 2. Follow up in 2 days for appointment for high risk OB care 3. CMeT 4. 24 hour total protein  By signing my name below, I, Terressa Koyanagi, attest that this documentation has been prepared under the direction and in the presence of Mallory Shirk, MD. Electronically Signed: Terressa Koyanagi, ED Scribe. 12/12/2014. 4:27 PM.   `````Attestation of Attending Supervision of Advanced Practitioner: Evaluation and management procedures were performed by the PA/NP/CNM/OB Fellow under my supervision/collaboration. Chart reviewed and agree with management and  plan.  Jonnie Kind 12/12/2014 4:34 PM    s

## 2014-12-13 ENCOUNTER — Telehealth: Payer: Self-pay | Admitting: Obstetrics and Gynecology

## 2014-12-13 LAB — COMPREHENSIVE METABOLIC PANEL
ALK PHOS: 82 IU/L (ref 39–117)
ALT: 16 IU/L (ref 0–32)
AST: 16 IU/L (ref 0–40)
Albumin/Globulin Ratio: 1 — ABNORMAL LOW (ref 1.1–2.5)
Albumin: 3.7 g/dL (ref 3.5–5.5)
BUN / CREAT RATIO: 13 (ref 8–20)
BUN: 7 mg/dL (ref 6–20)
CHLORIDE: 99 mmol/L (ref 97–106)
CO2: 22 mmol/L (ref 18–29)
CREATININE: 0.55 mg/dL — AB (ref 0.57–1.00)
Calcium: 9.3 mg/dL (ref 8.7–10.2)
GFR calc Af Amer: 138 mL/min/{1.73_m2} (ref >59)
GFR calc non Af Amer: 119 mL/min/{1.73_m2} (ref >59)
GLUCOSE: 72 mg/dL (ref 65–99)
Globulin, Total: 3.7 g/dL (ref 1.5–4.5)
Potassium: 4.5 mmol/L (ref 3.5–5.2)
Sodium: 137 mmol/L (ref 136–144)
Total Protein: 7.4 g/dL (ref 6.0–8.5)

## 2014-12-13 LAB — CBC
Hematocrit: 34.8 % (ref 34.0–46.6)
Hemoglobin: 11.7 g/dL (ref 11.1–15.9)
MCH: 31.8 pg (ref 26.6–33.0)
MCHC: 33.6 g/dL (ref 31.5–35.7)
MCV: 95 fL (ref 79–97)
PLATELETS: 243 10*3/uL (ref 150–379)
RBC: 3.68 x10E6/uL — AB (ref 3.77–5.28)
RDW: 14.8 % (ref 12.3–15.4)
WBC: 6.7 10*3/uL (ref 3.4–10.8)

## 2014-12-13 NOTE — Telephone Encounter (Signed)
Patient will be completing 24-hour urine total protein today at 4 PM

## 2014-12-14 ENCOUNTER — Other Ambulatory Visit: Payer: Self-pay | Admitting: Obstetrics and Gynecology

## 2014-12-14 ENCOUNTER — Ambulatory Visit (INDEPENDENT_AMBULATORY_CARE_PROVIDER_SITE_OTHER): Payer: Medicaid Other | Admitting: Advanced Practice Midwife

## 2014-12-14 ENCOUNTER — Other Ambulatory Visit (HOSPITAL_COMMUNITY): Payer: Self-pay | Admitting: Oncology

## 2014-12-14 ENCOUNTER — Encounter: Payer: Self-pay | Admitting: Advanced Practice Midwife

## 2014-12-14 ENCOUNTER — Ambulatory Visit (INDEPENDENT_AMBULATORY_CARE_PROVIDER_SITE_OTHER): Payer: Medicaid Other

## 2014-12-14 VITALS — BP 158/84 | HR 78 | Wt 373.4 lb

## 2014-12-14 DIAGNOSIS — I10 Essential (primary) hypertension: Secondary | ICD-10-CM

## 2014-12-14 DIAGNOSIS — Z23 Encounter for immunization: Secondary | ICD-10-CM

## 2014-12-14 DIAGNOSIS — Z1389 Encounter for screening for other disorder: Secondary | ICD-10-CM

## 2014-12-14 DIAGNOSIS — O09522 Supervision of elderly multigravida, second trimester: Secondary | ICD-10-CM

## 2014-12-14 DIAGNOSIS — O09893 Supervision of other high risk pregnancies, third trimester: Secondary | ICD-10-CM | POA: Diagnosis not present

## 2014-12-14 DIAGNOSIS — O09892 Supervision of other high risk pregnancies, second trimester: Secondary | ICD-10-CM

## 2014-12-14 DIAGNOSIS — O09523 Supervision of elderly multigravida, third trimester: Secondary | ICD-10-CM | POA: Diagnosis not present

## 2014-12-14 DIAGNOSIS — Z3490 Encounter for supervision of normal pregnancy, unspecified, unspecified trimester: Secondary | ICD-10-CM

## 2014-12-14 DIAGNOSIS — O10912 Unspecified pre-existing hypertension complicating pregnancy, second trimester: Secondary | ICD-10-CM

## 2014-12-14 DIAGNOSIS — O403XX0 Polyhydramnios, third trimester, not applicable or unspecified: Secondary | ICD-10-CM | POA: Diagnosis not present

## 2014-12-14 DIAGNOSIS — I2602 Saddle embolus of pulmonary artery with acute cor pulmonale: Secondary | ICD-10-CM

## 2014-12-14 DIAGNOSIS — I2699 Other pulmonary embolism without acute cor pulmonale: Secondary | ICD-10-CM

## 2014-12-14 DIAGNOSIS — Z331 Pregnant state, incidental: Secondary | ICD-10-CM | POA: Diagnosis not present

## 2014-12-14 DIAGNOSIS — D259 Leiomyoma of uterus, unspecified: Secondary | ICD-10-CM

## 2014-12-14 LAB — POCT URINALYSIS DIPSTICK
Blood, UA: NEGATIVE
GLUCOSE UA: NEGATIVE
KETONES UA: NEGATIVE
Leukocytes, UA: NEGATIVE
Nitrite, UA: NEGATIVE
Protein, UA: NEGATIVE

## 2014-12-14 LAB — PROTEIN, URINE, 24 HOUR
Protein, 24H Urine: 215.9 mg/24 hr — ABNORMAL HIGH (ref 30.0–150.0)
Protein, Ur: 12.7 mg/dL

## 2014-12-14 MED ORDER — ENOXAPARIN SODIUM 150 MG/ML ~~LOC~~ SOLN: 1.0000 mg/kg | Freq: Two times a day (BID) | SUBCUTANEOUS | Status: DC

## 2014-12-14 NOTE — Patient Instructions (Addendum)
Increase Labetalol to 400mg  in the morning and 200mg  at night  1. Before your test, do not eat or drink anything for 8-10 hours prior to your  appointment (a small amount of water is allowed and you may take any medicines you normally take). Be sure to drink lots of water the day before. 2. When you arrive, your blood will be drawn for a 'fasting' blood sugar level.  Then you will be given a sweetened carbonated beverage to drink. You should  complete drinking this beverage within five minutes. After finishing the  beverage, you will have your blood drawn exactly 1 and 2 hours later. Having  your blood drawn on time is an important part of this test. A total of three blood  samples will be done. 3. The test takes approximately 2  hours. During the test, do not have anything to  eat or drink. Do not smoke, chew gum (not even sugarless gum) or use breath mints.  4. During the test you should remain close by and seated as much as possible and  avoid walking around. You may want to bring a book or something else to  occupy your time.  5. After your test, you may eat and drink as normal. You may want to bring a snack  to eat after the test is finished. Your provider will advise you as to the results of  this test and any follow-up if necessary  If your sugar test is positive for gestational diabetes, you will be given an phone call and further instructions discussed. If you wish to know all of your test results before your next appointment, feel free to call the office, or look up your test results on Mychart.  (The range that the lab uses for normal values of the sugar test are not necessarily the range that is used for pregnant women; if your results are within the normal range, they are definitely normal.  However, if a value is deemed "high" by the lab, it may not be too high for a pregnant woman.  We will need to discuss the results if your value(s) fall in the "high" category).     Tdap  Vaccine  It is recommended that you get the Tdap vaccine during the third trimester of EACH pregnancy to help protect your baby from getting pertussis (whooping cough)  27-36 weeks is the BEST time to do this so that you can pass the protection on to your baby. During pregnancy is better than after pregnancy, but if you are unable to get it during pregnancy it will be offered at the hospital.  You can get this vaccine at the health department or your family doctor, as well as some pharmacies.  Everyone who will be around your baby should also be up-to-date on their vaccines. Adults (who are not pregnant) only need 1 dose of Tdap during adulthood.

## 2014-12-14 NOTE — Progress Notes (Signed)
Fetal Surveillance Testing today:  BPP   High Risk Pregnancy Diagnosis(es):   CHTN, hx PE   G4P0030 [redacted]w[redacted]d Estimated Date of Delivery: 03/02/15  Blood pressure 158/84, pulse 78, weight 373 lb 6.4 oz (169.373 kg), last menstrual period 05/23/2014.  Urinalysis: Negative   HPI: The patient is being seen today for ongoing management of CHTN, pregnancy. Today she reports no c/o.  Dr. Glo Herring plans weekly BPP until 32 weeks (when twice weekly testing will start).  Pt turned in 24 hour urine yesterday:  Protein  215 mg/day (below baseline). Was added Labetalol 200mg  BID last week.   BP weight and urine results all reviewed and noted. Lost 5# :)  Patient reports good fetal movement, denies any bleeding and no rupture of membranes symptoms or regular contractions.    Patient is without complaints other than noted in her HPI. All questions were answered.  All lab and sonogram results have been reviewed. Comments:  BPP today 8/8  Assessment:  1.  Pregnancy at [redacted]w[redacted]d,  Estimated Date of Delivery: 03/02/15 :                          2.  CHTN, suboptimal control                        3.  Hx PE   4.  New dx of polyhydramnios  Medication(s) Plans:  Increase Labetalol to 400 mg in am and 200mg  in PM (tablets not scored per pt); continue Norvasc 10mg  continue Lovenox 150 mg SQ  BID  Treatment Plan:  Weekly BPP until 32 weeks, then begin NST/US alternate weekly.  NEEDS PN2!  Return in about 1 week (around 12/21/2014) for HROB, PN2, US:BPP. for appointment for high risk OB care  No orders of the defined types were placed in this encounter.   Orders Placed This Encounter  Procedures  . Flu Vaccine QUAD 36+ mos IM  . POCT Urinalysis Dipstick

## 2014-12-14 NOTE — Progress Notes (Signed)
[place] 28+6wks,cephalic,fhr 0000000 [id number] pl gr 1,afi 27cm (polyhydramnios),two ant. fibroids N/C

## 2014-12-21 ENCOUNTER — Other Ambulatory Visit: Payer: Self-pay | Admitting: Obstetrics and Gynecology

## 2014-12-21 DIAGNOSIS — O3412 Maternal care for benign tumor of corpus uteri, second trimester: Secondary | ICD-10-CM

## 2014-12-21 DIAGNOSIS — O10912 Unspecified pre-existing hypertension complicating pregnancy, second trimester: Secondary | ICD-10-CM

## 2014-12-21 DIAGNOSIS — D259 Leiomyoma of uterus, unspecified: Secondary | ICD-10-CM

## 2014-12-21 DIAGNOSIS — O99210 Obesity complicating pregnancy, unspecified trimester: Secondary | ICD-10-CM

## 2014-12-22 ENCOUNTER — Ambulatory Visit (INDEPENDENT_AMBULATORY_CARE_PROVIDER_SITE_OTHER): Payer: Medicaid Other

## 2014-12-22 ENCOUNTER — Ambulatory Visit (INDEPENDENT_AMBULATORY_CARE_PROVIDER_SITE_OTHER): Payer: Medicaid Other | Admitting: Obstetrics and Gynecology

## 2014-12-22 ENCOUNTER — Other Ambulatory Visit: Payer: Medicaid Other

## 2014-12-22 ENCOUNTER — Encounter: Payer: Self-pay | Admitting: Obstetrics and Gynecology

## 2014-12-22 VITALS — BP 130/86 | HR 74 | Wt 374.0 lb

## 2014-12-22 DIAGNOSIS — D251 Intramural leiomyoma of uterus: Secondary | ICD-10-CM

## 2014-12-22 DIAGNOSIS — Z1389 Encounter for screening for other disorder: Secondary | ICD-10-CM | POA: Diagnosis not present

## 2014-12-22 DIAGNOSIS — Z131 Encounter for screening for diabetes mellitus: Secondary | ICD-10-CM

## 2014-12-22 DIAGNOSIS — I10 Essential (primary) hypertension: Secondary | ICD-10-CM

## 2014-12-22 DIAGNOSIS — O99210 Obesity complicating pregnancy, unspecified trimester: Secondary | ICD-10-CM

## 2014-12-22 DIAGNOSIS — O09893 Supervision of other high risk pregnancies, third trimester: Secondary | ICD-10-CM | POA: Diagnosis not present

## 2014-12-22 DIAGNOSIS — Z331 Pregnant state, incidental: Secondary | ICD-10-CM

## 2014-12-22 DIAGNOSIS — O10913 Unspecified pre-existing hypertension complicating pregnancy, third trimester: Secondary | ICD-10-CM | POA: Diagnosis not present

## 2014-12-22 DIAGNOSIS — O10912 Unspecified pre-existing hypertension complicating pregnancy, second trimester: Secondary | ICD-10-CM | POA: Diagnosis not present

## 2014-12-22 DIAGNOSIS — Z3A30 30 weeks gestation of pregnancy: Secondary | ICD-10-CM | POA: Diagnosis not present

## 2014-12-22 DIAGNOSIS — O99213 Obesity complicating pregnancy, third trimester: Secondary | ICD-10-CM | POA: Diagnosis not present

## 2014-12-22 DIAGNOSIS — Z369 Encounter for antenatal screening, unspecified: Secondary | ICD-10-CM

## 2014-12-22 DIAGNOSIS — O09523 Supervision of elderly multigravida, third trimester: Secondary | ICD-10-CM

## 2014-12-22 DIAGNOSIS — O3412 Maternal care for benign tumor of corpus uteri, second trimester: Secondary | ICD-10-CM

## 2014-12-22 DIAGNOSIS — D259 Leiomyoma of uterus, unspecified: Secondary | ICD-10-CM

## 2014-12-22 LAB — POCT URINALYSIS DIPSTICK
Blood, UA: NEGATIVE
GLUCOSE UA: NEGATIVE
KETONES UA: NEGATIVE
Leukocytes, UA: NEGATIVE
Nitrite, UA: NEGATIVE
Protein, UA: NEGATIVE

## 2014-12-22 NOTE — Progress Notes (Signed)
[place] 30wks,BPP 8/8,FHR 146 bpm,bilat adnexa's wnl,trans head right,AFI 25.4cm (poly),post pl gr 1,ant fibroids n/c

## 2014-12-22 NOTE — Progress Notes (Signed)
Pt denies any problems or concerns at this time.  

## 2014-12-22 NOTE — Progress Notes (Signed)
Patient ID: [name] [name], female   DOB: [birth year], 39 y.o.   MRN: [id number]   High Risk Pregnancy Diagnosis(es):   CHTN, hx PE   G4P0030 [redacted]w[redacted]d Estimated Date of Delivery: 03/02/15     HPI: The patient is being seen today for ongoing management of HROB due to [facility] and hx of PE Today she has no complaints. She states she has been exercising by walking regularly. Patient reports good fetal movement, denies any bleeding and no rupture of membranes symptoms or regular contractions.   BP weight and urine results all reviewed and noted. Blood pressure 130/86, pulse 74, weight 374 lb (169.645 kg), last menstrual period 05/23/2014.  Fetal Surveillance Testing today:  BPP 8/8 Fundal Height:  42cm Fetal Heart rate:  146 Edema:  none Urinalysis: Negative   Questions were answered.  Lab and sonogram results have been reviewed. Comments: normal  Assessment:  1.  Pregnancy at [redacted]w[redacted]d,  Estimated Date of Delivery: 03/02/15 :                          2.  CHTN                        3.  H/o PE on Lovenox                         Morbid odbesity. Medication(s) Plans:  Increase lebetalol 400mg  BID, continue Norvasc 10mg  as currently prescribed  Treatment Plan:   Follow up in 1 week for appointment for high risk OB care and BPP test   By signing my name below, I, Erling Conte, attest that this documentation has been prepared under the direction and in the presence of Jonnie Kind, MD. Electronically Signed: Erling Conte, ED Scribe. 12/22/2014. 10:08 AM.  I personally performed the services described in this documentation, which was SCRIBED in my presence. The recorded information has been reviewed and considered accurate. It has been edited as necessary during review. Jonnie Kind, MD

## 2014-12-23 LAB — CBC
HEMATOCRIT: 37.4 % (ref 34.0–46.6)
HEMOGLOBIN: 12.4 g/dL (ref 11.1–15.9)
MCH: 31.6 pg (ref 26.6–33.0)
MCHC: 33.2 g/dL (ref 31.5–35.7)
MCV: 95 fL (ref 79–97)
Platelets: 229 10*3/uL (ref 150–379)
RBC: 3.92 x10E6/uL (ref 3.77–5.28)
RDW: 15.2 % (ref 12.3–15.4)
WBC: 5.4 10*3/uL (ref 3.4–10.8)

## 2014-12-23 LAB — RPR: RPR Ser Ql: NONREACTIVE

## 2014-12-23 LAB — HIV ANTIBODY (ROUTINE TESTING W REFLEX): HIV Screen 4th Generation wRfx: NONREACTIVE

## 2014-12-23 LAB — GLUCOSE TOLERANCE, 2 HOURS W/ 1HR
GLUCOSE, 2 HOUR: 99 mg/dL (ref 65–152)
Glucose, 1 hour: 143 mg/dL (ref 65–179)
Glucose, Fasting: 83 mg/dL (ref 65–91)

## 2014-12-23 LAB — ANTIBODY SCREEN: ANTIBODY SCREEN: NEGATIVE

## 2014-12-29 ENCOUNTER — Encounter: Payer: Self-pay | Admitting: Obstetrics and Gynecology

## 2014-12-29 ENCOUNTER — Ambulatory Visit (INDEPENDENT_AMBULATORY_CARE_PROVIDER_SITE_OTHER): Payer: Medicaid Other | Admitting: Obstetrics and Gynecology

## 2014-12-29 ENCOUNTER — Other Ambulatory Visit: Payer: Self-pay | Admitting: Obstetrics and Gynecology

## 2014-12-29 ENCOUNTER — Ambulatory Visit: Payer: Self-pay | Admitting: Emergency Medicine

## 2014-12-29 ENCOUNTER — Other Ambulatory Visit (INDEPENDENT_AMBULATORY_CARE_PROVIDER_SITE_OTHER): Payer: Medicaid Other

## 2014-12-29 VITALS — BP 158/98 | HR 74 | Wt 384.0 lb

## 2014-12-29 DIAGNOSIS — O3483 Maternal care for other abnormalities of pelvic organs, third trimester: Secondary | ICD-10-CM

## 2014-12-29 DIAGNOSIS — Z331 Pregnant state, incidental: Secondary | ICD-10-CM | POA: Diagnosis not present

## 2014-12-29 DIAGNOSIS — O09893 Supervision of other high risk pregnancies, third trimester: Secondary | ICD-10-CM | POA: Diagnosis not present

## 2014-12-29 DIAGNOSIS — O10913 Unspecified pre-existing hypertension complicating pregnancy, third trimester: Secondary | ICD-10-CM

## 2014-12-29 DIAGNOSIS — I10 Essential (primary) hypertension: Secondary | ICD-10-CM

## 2014-12-29 DIAGNOSIS — Z3A31 31 weeks gestation of pregnancy: Secondary | ICD-10-CM | POA: Diagnosis not present

## 2014-12-29 DIAGNOSIS — O09522 Supervision of elderly multigravida, second trimester: Secondary | ICD-10-CM

## 2014-12-29 DIAGNOSIS — Z1389 Encounter for screening for other disorder: Secondary | ICD-10-CM

## 2014-12-29 DIAGNOSIS — O10912 Unspecified pre-existing hypertension complicating pregnancy, second trimester: Secondary | ICD-10-CM

## 2014-12-29 DIAGNOSIS — O403XX0 Polyhydramnios, third trimester, not applicable or unspecified: Secondary | ICD-10-CM

## 2014-12-29 LAB — POCT URINALYSIS DIPSTICK
Blood, UA: NEGATIVE
Glucose, UA: NEGATIVE
Ketones, UA: NEGATIVE
LEUKOCYTES UA: NEGATIVE
Nitrite, UA: NEGATIVE

## 2014-12-29 MED ORDER — LABETALOL HCL 200 MG PO TABS: 600.0000 mg | ORAL_TABLET | Freq: Two times a day (BID) | ORAL | Status: DC

## 2014-12-29 NOTE — Progress Notes (Addendum)
Patient ID: [name] [name], female   DOB: 06-01-1975, 39 y.o.   MRN: [id number]   High Risk Pregnancy Diagnosis(es):   CHTN with baseline proteinuria, hx PE , multiple fibroids..  [id number] [redacted]w[redacted]d Estimated Date of Delivery: 03/02/15     HPI: The patient is being seen today for ongoing management of CHTN with baseline proteinuria,  Hx PE on Therapeutic Lovenox, multiple fibroids. Today she reports no complaints Patient reports good fetal movement, denies any bleeding and no rupture of membranes symptoms or regular contractions.   BP weight and urine results all reviewed and noted. Blood pressure 158/98, pulse 74, weight 384 lb (174.181 kg), last menstrual period 05/23/2014.  Fetal Surveillance Testing today:  BPP 8/8 AND Dopplers 0.67 and 0.70 Fundal Height:  44cm Fetal Heart rate:  144 Edema:  none Urinalysis: Positive for trace protein   Questions were answered.  Lab and sonogram results have been reviewed. Comments:  Recheck BP 160/98, so will increase Labetalol  Assessment:  1.  Pregnancy at [redacted]w[redacted]d,  Estimated Date of Delivery: 03/02/15                              CHTN progressive increase in BP will increase Labetalol 600 bid                             Continue weekly BPP til 32 wk then increase to twice weekly testing alternating NST and BPP. Growth u/s next week also.                         2.  Folliculitis of abdomen                        3   Hx PE continue Lovenox  Medication(s) Plans:  Keflex 500mg  QID x10 days; Increase labetalol 600mg  BID  Treatment Plan:   Follow up in 1 week for appointment for high risk OB care  By signing my name below, I, Erling Conte, attest that this documentation has been prepared under the direction and in the presence of Jonnie Kind, MD. Electronically Signed: Erling Conte, ED Scribe. 12/29/2014. 11:04 AM.  I personally performed the services described in this documentation, which was SCRIBED in my presence. The recorded  information has been reviewed and considered accurate. It has been edited as necessary during review. Jonnie Kind, MD

## 2014-12-29 NOTE — Progress Notes (Signed)
Pt denies any problems or concerns at this time.  

## 2014-12-29 NOTE — Progress Notes (Signed)
[place] 31wks,cephalic,BPP Q000111Q .[id number] pl gr 1,afi 21cm,simple rt ov cyst n/c,normal lt ov,mult fibroids n/c,fht 144bpm

## 2014-12-30 ENCOUNTER — Telehealth: Payer: Self-pay | Admitting: *Deleted

## 2014-12-30 NOTE — Telephone Encounter (Addendum)
Pt calling to clarify Dr.Ferguson wants her to take the Labetalol 200 mg 3 tablets BID. Per Dr. Glo Herring  take Labetalol as prescribed, total of 1200 mg daily. Pt verbalized understanding.

## 2015-01-04 ENCOUNTER — Other Ambulatory Visit: Payer: Self-pay | Admitting: Obstetrics and Gynecology

## 2015-01-04 DIAGNOSIS — O09522 Supervision of elderly multigravida, second trimester: Secondary | ICD-10-CM

## 2015-01-04 DIAGNOSIS — O10912 Unspecified pre-existing hypertension complicating pregnancy, second trimester: Secondary | ICD-10-CM

## 2015-01-05 ENCOUNTER — Ambulatory Visit (INDEPENDENT_AMBULATORY_CARE_PROVIDER_SITE_OTHER): Payer: Medicaid Other | Admitting: Obstetrics & Gynecology

## 2015-01-05 ENCOUNTER — Ambulatory Visit (INDEPENDENT_AMBULATORY_CARE_PROVIDER_SITE_OTHER): Payer: Medicaid Other

## 2015-01-05 ENCOUNTER — Encounter: Payer: Self-pay | Admitting: Obstetrics & Gynecology

## 2015-01-05 VITALS — BP 128/72 | HR 80 | Wt 382.0 lb

## 2015-01-05 DIAGNOSIS — I10 Essential (primary) hypertension: Secondary | ICD-10-CM | POA: Diagnosis not present

## 2015-01-05 DIAGNOSIS — O10912 Unspecified pre-existing hypertension complicating pregnancy, second trimester: Secondary | ICD-10-CM

## 2015-01-05 DIAGNOSIS — Z1389 Encounter for screening for other disorder: Secondary | ICD-10-CM

## 2015-01-05 DIAGNOSIS — O09893 Supervision of other high risk pregnancies, third trimester: Secondary | ICD-10-CM | POA: Diagnosis not present

## 2015-01-05 DIAGNOSIS — Z331 Pregnant state, incidental: Secondary | ICD-10-CM

## 2015-01-05 DIAGNOSIS — O99213 Obesity complicating pregnancy, third trimester: Secondary | ICD-10-CM

## 2015-01-05 DIAGNOSIS — O09522 Supervision of elderly multigravida, second trimester: Secondary | ICD-10-CM | POA: Diagnosis not present

## 2015-01-05 DIAGNOSIS — O88013 Air embolism in pregnancy, third trimester: Secondary | ICD-10-CM | POA: Diagnosis not present

## 2015-01-05 DIAGNOSIS — Z3A32 32 weeks gestation of pregnancy: Secondary | ICD-10-CM

## 2015-01-05 DIAGNOSIS — O10913 Unspecified pre-existing hypertension complicating pregnancy, third trimester: Secondary | ICD-10-CM

## 2015-01-05 DIAGNOSIS — O403XX0 Polyhydramnios, third trimester, not applicable or unspecified: Secondary | ICD-10-CM

## 2015-01-05 LAB — POCT URINALYSIS DIPSTICK
Blood, UA: NEGATIVE
GLUCOSE UA: NEGATIVE
KETONES UA: NEGATIVE
LEUKOCYTES UA: NEGATIVE
Nitrite, UA: NEGATIVE
PROTEIN UA: NEGATIVE

## 2015-01-05 NOTE — Progress Notes (Signed)
Fetal Surveillance Testing today:  BPP 8/8, excelelnt Dopplers   High Risk Pregnancy Diagnosis(es):   CHTN  [id number] [redacted]w[redacted]d Estimated Date of Delivery: 03/02/15  Blood pressure 128/72, pulse 80, weight 382 lb (173.274 kg), last menstrual period 05/23/2014.  Urinalysis: Negative   HPI: The patient is being seen today for ongoing management of CHTN. Today she reports no problems some edema   BP weight and urine results all reviewed and noted. Patient reports good fetal movement, denies any bleeding and no rupture of membranes symptoms or regular contractions.  Fundal Height:  46 Fetal Heart rate:  145 Edema:  2+  Patient is without complaints other than noted in her HPI. All questions were answered.  All lab and sonogram results have been reviewed. Comments: abnormal:    Assessment:  1.  Pregnancy at [redacted]w[redacted]d,  Estimated Date of Delivery: 03/02/15 :                          2.  CHTN                        3.  Morbid obesity  Medication(s) Plans:  Norvasc 10, labetalol 600 BID  Treatment Plan:  Twice weekly surveillance, sono alt with NST  Return in about 5 days (around 01/10/2015) for NST, HROB. for appointment for high risk OB care  No orders of the defined types were placed in this encounter.   Orders Placed This Encounter  Procedures  . POCT urinalysis dipstick

## 2015-01-05 NOTE — Progress Notes (Signed)
[place] 32wks,cephalic,BPP Q000111Q .99991111 12.4cm,fhr 140 bpm,efw 2279g,73%,post pl gr 1,simple rt ov cyst and mult fibroids n/c,limited ultrasound because of pt body habitus.

## 2015-01-05 NOTE — Progress Notes (Signed)
Pt denies any problems or concerns at this time.  

## 2015-01-10 ENCOUNTER — Encounter (HOSPITAL_COMMUNITY): Payer: Self-pay | Admitting: *Deleted

## 2015-01-10 ENCOUNTER — Inpatient Hospital Stay (HOSPITAL_COMMUNITY)
Admission: AD | Admit: 2015-01-10 | Discharge: 2015-01-10 | Disposition: A | Discharge status: Home / Self Care | Payer: Medicaid Other | Source: Ambulatory Visit | Attending: Obstetrics & Gynecology | Admitting: Obstetrics & Gynecology

## 2015-01-10 ENCOUNTER — Encounter: Payer: Self-pay | Admitting: Obstetrics & Gynecology

## 2015-01-10 ENCOUNTER — Ambulatory Visit (INDEPENDENT_AMBULATORY_CARE_PROVIDER_SITE_OTHER): Payer: Medicaid Other | Admitting: Obstetrics & Gynecology

## 2015-01-10 VITALS — BP 180/100 | HR 76 | Wt 384.0 lb

## 2015-01-10 DIAGNOSIS — L02211 Cutaneous abscess of abdominal wall: Secondary | ICD-10-CM | POA: Diagnosis not present

## 2015-01-10 DIAGNOSIS — G4733 Obstructive sleep apnea (adult) (pediatric): Secondary | ICD-10-CM | POA: Diagnosis not present

## 2015-01-10 DIAGNOSIS — Z1389 Encounter for screening for other disorder: Secondary | ICD-10-CM

## 2015-01-10 DIAGNOSIS — Y848 Other medical procedures as the cause of abnormal reaction of the patient, or of later complication, without mention of misadventure at the time of the procedure: Secondary | ICD-10-CM | POA: Insufficient documentation

## 2015-01-10 DIAGNOSIS — O98813 Other maternal infectious and parasitic diseases complicating pregnancy, third trimester: Secondary | ICD-10-CM | POA: Insufficient documentation

## 2015-01-10 DIAGNOSIS — Z3A32 32 weeks gestation of pregnancy: Secondary | ICD-10-CM | POA: Insufficient documentation

## 2015-01-10 DIAGNOSIS — O88013 Air embolism in pregnancy, third trimester: Secondary | ICD-10-CM

## 2015-01-10 DIAGNOSIS — O10913 Unspecified pre-existing hypertension complicating pregnancy, third trimester: Secondary | ICD-10-CM

## 2015-01-10 DIAGNOSIS — I10 Essential (primary) hypertension: Secondary | ICD-10-CM

## 2015-01-10 DIAGNOSIS — O99213 Obesity complicating pregnancy, third trimester: Secondary | ICD-10-CM

## 2015-01-10 DIAGNOSIS — L0292 Furuncle, unspecified: Secondary | ICD-10-CM

## 2015-01-10 DIAGNOSIS — Z331 Pregnant state, incidental: Secondary | ICD-10-CM

## 2015-01-10 DIAGNOSIS — O09893 Supervision of other high risk pregnancies, third trimester: Secondary | ICD-10-CM

## 2015-01-10 DIAGNOSIS — Z86711 Personal history of pulmonary embolism: Secondary | ICD-10-CM | POA: Diagnosis not present

## 2015-01-10 DIAGNOSIS — O403XX0 Polyhydramnios, third trimester, not applicable or unspecified: Secondary | ICD-10-CM

## 2015-01-10 DIAGNOSIS — Z3A40 40 weeks gestation of pregnancy: Secondary | ICD-10-CM

## 2015-01-10 DIAGNOSIS — T8029XA Infection following other infusion, transfusion and therapeutic injection, initial encounter: Secondary | ICD-10-CM | POA: Insufficient documentation

## 2015-01-10 HISTORY — DX: Benign neoplasm of connective and other soft tissue, unspecified: D21.9

## 2015-01-10 HISTORY — PX: ABSCESS DRAINAGE: SHX1119

## 2015-01-10 LAB — POCT URINALYSIS DIPSTICK
Glucose, UA: NEGATIVE
KETONES UA: NEGATIVE
Leukocytes, UA: NEGATIVE
Nitrite, UA: NEGATIVE
RBC UA: NEGATIVE

## 2015-01-10 LAB — CBC
HCT: 35.2 % — ABNORMAL LOW (ref 36.0–46.0)
Hemoglobin: 11.6 g/dL — ABNORMAL LOW (ref 12.0–15.0)
MCH: 31.7 pg (ref 26.0–34.0)
MCHC: 33 g/dL (ref 30.0–36.0)
MCV: 96.2 fL (ref 78.0–100.0)
Platelets: 184 10*3/uL (ref 150–400)
RBC: 3.66 MIL/uL — ABNORMAL LOW (ref 3.87–5.11)
RDW: 14.2 % (ref 11.5–15.5)
WBC: 6 10*3/uL (ref 4.0–10.5)

## 2015-01-10 LAB — COMPREHENSIVE METABOLIC PANEL
ALT: 15 U/L (ref 14–54)
AST: 16 U/L (ref 15–41)
Albumin: 3.1 g/dL — ABNORMAL LOW (ref 3.5–5.0)
Alkaline Phosphatase: 85 U/L (ref 38–126)
Anion gap: 9 (ref 5–15)
BUN: 12 mg/dL (ref 6–20)
CO2: 25 mmol/L (ref 22–32)
Calcium: 9.5 mg/dL (ref 8.9–10.3)
Chloride: 104 mmol/L (ref 101–111)
Creatinine, Ser: 0.61 mg/dL (ref 0.44–1.00)
GFR calc Af Amer: >60 mL/min (ref >60)
GFR calc non Af Amer: >60 mL/min (ref >60)
Glucose, Bld: 99 mg/dL (ref 65–99)
Potassium: 4.3 mmol/L (ref 3.5–5.1)
Sodium: 138 mmol/L (ref 135–145)
Total Bilirubin: 0.5 mg/dL (ref 0.3–1.2)
Total Protein: 7.6 g/dL (ref 6.5–8.1)

## 2015-01-10 LAB — PROTEIN / CREATININE RATIO, URINE
Creatinine, Urine: 141 mg/dL
Protein Creatinine Ratio: 0.1 mg/mg{Cre} (ref 0.00–0.15)
Total Protein, Urine: 14 mg/dL

## 2015-01-10 MED ORDER — AMOXICILLIN-POT CLAVULANATE 875-125 MG PO TABS: 1.0000 | ORAL_TABLET | Freq: Two times a day (BID) | ORAL | Status: DC

## 2015-01-10 NOTE — Discharge Instructions (Signed)
Abscess °An abscess (boil or furuncle) is an infected area on or under the skin. This area is filled with yellowish-white fluid (pus) and other material (debris). °HOME CARE  °· Only take medicines as told by your doctor. °· If you were given antibiotic medicine, take it as directed. Finish the medicine even if you start to feel better. °· If gauze is used, follow your doctor's directions for changing the gauze. °· To avoid spreading the infection: °¨ Keep your abscess covered with a bandage. °¨ Wash your hands well. °¨ Do not share personal care items, towels, or whirlpools with others. °¨ Avoid skin contact with others. °· Keep your skin and clothes clean around the abscess. °· Keep all doctor visits as told. °GET HELP RIGHT AWAY IF:  °· You have more pain, puffiness (swelling), or redness in the wound site. °· You have more fluid or blood coming from the wound site. °· You have muscle aches, chills, or you feel sick. °· You have a fever. °MAKE SURE YOU:  °· Understand these instructions. °· Will watch your condition. °· Will get help right away if you are not doing well or get worse. °  °This information is not intended to replace advice given to you by your health care provider. Make sure you discuss any questions you have with your health care provider. °  °Document Released: 06/26/2007 Document Revised: 07/09/2011 Document Reviewed: 03/23/2011 °Elsevier Interactive Patient Education ©2016 [employer] ° °

## 2015-01-10 NOTE — MAU Note (Signed)
Urine in lab 

## 2015-01-10 NOTE — MAU Note (Signed)
Seen at office today. BP was up.  Is on BP meds. Sent in for further eval.  Slight HA, no visual changes or epigastric pain.  Has a boil on RUQ.

## 2015-01-10 NOTE — Progress Notes (Signed)
Fetal Surveillance Testing today:  Reactive NST   High Risk Pregnancy Diagnosis(es):   Chronic hypertension  G4P1031 [redacted]w[redacted]d Estimated Date of Delivery: 03/02/15  Blood pressure 180/100, pulse 76, weight 384 lb (174.181 kg), last menstrual period 05/23/2014, unknown if currently breastfeeding.  Urinalysis: Negative   HPI: The patient is being seen today for ongoing management of as above. Today she reports boils in injection sites   BP weight and urine results all reviewed and noted. Patient reports good fetal movement, denies any bleeding and no rupture of membranes symptoms or regular contractions.  Fundal Height:  U=28 Fetal Heart rate:  130 Edema:  none  Patient is without complaints other than noted in her HPI. All questions were answered.  All lab and sonogram results have been reviewed. Comments: abnormal:    Assessment:  1.  Pregnancy at [redacted]w[redacted]d,  Estimated Date of Delivery: 03/02/15 :                          2.  Chronic hypertension with worsened status today                        3.  MRSA boils abdominal wall                        4.  PE on lovenox  Medication(s) Plans:  Increase labetalol, go to MAU  Treatment Plan:  MAU eval  No Follow-up on file. for appointment for high risk OB care  No orders of the defined types were placed in this encounter.   Orders Placed This Encounter  Procedures  . POCT urinalysis dipstick     Talkded with Dr Ernestina Patches regarding status CHTN on 2 meds with 2 abdominal wall abscess from lovenox injections  Go to MAU for evaluation and indicated management of her BP and probable MRSA abscesses

## 2015-01-10 NOTE — MAU Provider Note (Signed)
History     CSN: [id number]  Arrival date and time: 01/10/15 1625   First Provider Initiated Contact with Patient 01/10/15 1716      Chief Complaint  Patient presents with  . Hypertension   HPI  [name] [name] 39 y.o. [id number] @ [redacted]w[redacted]d presents from Dr. Brynda Greathouse office for preeclampsia evaluation and for incision and drainage of abcess from lovenox injections.  Past Medical History  Diagnosis Date  . Closed left ankle fracture     August 30 2012  . Obesity   . Tobacco use disorder   . High blood pressure   . Pulmonary embolism ([place]) 04/2013  . OSA on CPAP   . Pregnancy     09/20/14 17 weeks  . Fibroid tumor     Past Surgical History  Procedure Laterality Date  . Cyst removal trunk      Family History  Problem Relation Age of Onset  . Hypertension Mother   . Diabetes Paternal Aunt   . Diabetes Paternal Uncle   . Other Father     blood clots  . Cancer Maternal Grandmother   . Heart attack Paternal Grandmother     Social History  Substance Use Topics  . Smoking status: Former Smoker -- 0.25 packs/day for 18 years    Types: Cigarettes  . Smokeless tobacco: Never Used  . Alcohol Use: No    Allergies:  Allergies  Allergen Reactions  . Sulfa Antibiotics Swelling    Lips swelling, rash, itching    Prescriptions prior to admission  Medication Sig Dispense Refill Last Dose  . amLODipine (NORVASC) 10 MG tablet Take 1 tablet (10 mg total) by mouth daily. 30 tablet 3 01/10/2015 at Unknown time  . enoxaparin (LOVENOX) 150 MG/ML injection Inject 1.04 mLs (155 mg total) into the skin every 12 (twelve) hours. 60 Syringe 3 01/10/2015 at 0830  . Iron Polysacch Cmplx-B12-FA 150-0.025-1 MG CAPS Take 1 capsule by mouth daily. 30 each 6 01/10/2015 at Unknown time  . labetalol (NORMODYNE) 200 MG tablet Take 3 tablets (600 mg total) by mouth 2 (two) times daily. 84 tablet 2 01/10/2015 at 0830  . Prenatal Vit-Fe Fumarate-FA (PRENATAL COMPLETE) 14-0.4 MG TABS Take 1 tablet by  mouth daily. 100 each 3 01/10/2015 at Unknown time    Review of Systems  Constitutional: Negative for fever.  Skin:       3 abcesses on upper abdomen; One is draining.   Neurological: Negative for headaches.  All other systems reviewed and are negative.  Physical Exam   Blood pressure 139/82, pulse 77, temperature 98.7 F (37.1 C), temperature source Oral, resp. rate 20, last menstrual period 05/23/2014.  Physical Exam  Nursing note and vitals reviewed. Constitutional: She is oriented to person, place, and time. She appears well-developed and well-nourished. No distress.  HENT:  Head: Normocephalic and atraumatic.  Cardiovascular: Normal rate.   Respiratory: Effort normal and breath sounds normal. No respiratory distress.  GI: Soft. There is no tenderness.  Neurological: She is alert and oriented to person, place, and time.  Skin: There is erythema.  abcess x 3 on abdomen  Psychiatric: She has a normal mood and affect. Her behavior is normal. Judgment and thought content normal.    MAU Course  Procedures  MDM Preeclampsia evaluation is negative. FHR Cat 1 no contractions. Dr Ernestina Patches consulted here to do I/D procedure on patient. Plan to do cultures. Pt will be sent home on Augmentin x 10 days. Wound packed and pt will need  to return to office in 2 days to repack the wound.  Assessment and Plan  Chronic Hypertension Abdominal Abcess Augmentin 875mg  po BID x 10 days Discharge   Yonathan Perrow Grissett 01/10/2015, 6:40 PM

## 2015-01-10 NOTE — Procedures (Signed)
Patient consented for bedside ID of Right and left abdominal wall cutaneous abscesses. Time out performed  Left Cutaneous abscess  Area was cleaned with alcohol and injected with 72mL of 1% lidocaine. Cleaned area with betadine. 1cm incision made over fluctuant area. There was copious blood and pus drainage. Wound culture collected. Hemostats used to probe wound and break up septations. Cavity was about 1cm deep. Packed wound with iodoform gauze and placed gauze over area.   Right cutaneous abscess 45mm opening already present. Area cleaned with alcohol and injected with 54mL 1% lidocaineCleaned area with betadine. 1cm incision to extend existing opening into fluctuant area. There was copious blood and pus drainage.Hemostats used to probe wound and break up septations but patient requested early termination due to inability to tolerate pain. Cavity was about 1cm deep. Packed wound with iodoform gauze and placed gauze over area.  Patient tolerated procedure. Instructed to follow up with [facility] in 2 days for change of packing and to follow up wound culture that was collected. We will empirically start Augmentin but suspect MRSA, however given local resistance rates we will awaite sensitivities as patient cannot take Bactrim or Doxycycline during pregnancy. Clindamycin, if isolate is sensitive to this, would be a good option.   Caren Macadam, MD

## 2015-01-10 NOTE — Progress Notes (Signed)
Computer not working in room 10, pt signed paper copy of d/c instructions.

## 2015-01-11 ENCOUNTER — Encounter (HOSPITAL_BASED_OUTPATIENT_CLINIC_OR_DEPARTMENT_OTHER): Payer: Medicaid Other

## 2015-01-11 ENCOUNTER — Encounter (HOSPITAL_COMMUNITY): Payer: Medicaid Other | Attending: Hematology & Oncology | Admitting: Hematology & Oncology

## 2015-01-11 ENCOUNTER — Encounter (HOSPITAL_COMMUNITY): Payer: Self-pay | Admitting: Hematology & Oncology

## 2015-01-11 VITALS — BP 140/85 | HR 77 | Temp 97.8°F | Resp 16 | Wt 383.4 lb

## 2015-01-11 DIAGNOSIS — O99011 Anemia complicating pregnancy, first trimester: Secondary | ICD-10-CM

## 2015-01-11 DIAGNOSIS — Z720 Tobacco use: Secondary | ICD-10-CM | POA: Diagnosis not present

## 2015-01-11 DIAGNOSIS — I2782 Chronic pulmonary embolism: Secondary | ICD-10-CM

## 2015-01-11 DIAGNOSIS — I2602 Saddle embolus of pulmonary artery with acute cor pulmonale: Secondary | ICD-10-CM | POA: Insufficient documentation

## 2015-01-11 DIAGNOSIS — O039 Complete or unspecified spontaneous abortion without complication: Secondary | ICD-10-CM

## 2015-01-11 DIAGNOSIS — O88211 Thromboembolism in pregnancy, first trimester: Secondary | ICD-10-CM

## 2015-01-11 DIAGNOSIS — D509 Iron deficiency anemia, unspecified: Secondary | ICD-10-CM | POA: Diagnosis not present

## 2015-01-11 DIAGNOSIS — O09513 Supervision of elderly primigravida, third trimester: Secondary | ICD-10-CM

## 2015-01-11 DIAGNOSIS — O2621 Pregnancy care for patient with recurrent pregnancy loss, first trimester: Secondary | ICD-10-CM | POA: Diagnosis not present

## 2015-01-11 DIAGNOSIS — D500 Iron deficiency anemia secondary to blood loss (chronic): Secondary | ICD-10-CM

## 2015-01-11 LAB — IRON AND TIBC
IRON: 59 ug/dL (ref 28–170)
SATURATION RATIOS: 12 % (ref 10.4–31.8)
TIBC: 475 ug/dL — AB (ref 250–450)
UIBC: 416 ug/dL

## 2015-01-11 LAB — FERRITIN: FERRITIN: 17 ng/mL (ref 11–307)

## 2015-01-11 NOTE — Progress Notes (Signed)
[place] [address]  Pulmonary embolus,  CT 04/27/2013 saddle embolism IVC filter 04/29/2013 LE Ultrasound  2 miscarriages one at 9 weeks, 6 weeks Iron deficiency Anemia with pagophagia, heavy menses on Xarelto Smokes 2 to 3 cigarettes a day [redacted] weeks pregnant.  Estimated delivery of 03/02/2015  CURRENT THERAPY:Lovenox  INTERVAL HISTORY: [name] [name] 39 y.o. female returns for follow-up of her history of PE. She wears O2 at home, but notes since she has received iron she does not need her O2.  [name] [name] returns to the [facility] alone today.  She is pregnant, and due on February 9th. She remarks that she's probably going to stay on her blood thinner up until a week or two closer to the due date.   She's having a girl and is excited, and remarks "it's coming fast though." She comments that the father is VERY excited as well.  Overall, she states she feels good, aside from the swelling in her legs sometimes. She confirms that her energy levels are good.  Of note, she is experiencing some difficulties around her abdomen, developing two abcesses where she was previously injecting herself. She was just seen at [facility] hospital and underwent I&D and also had a wound culture from these areas.  She says she gives herself her own shots at an angle and has never had any issues before. She's on Augmentin antibiotic.   MEDICAL HISTORY: Past Medical History  Diagnosis Date  . Closed left ankle fracture     August 30 2012  . Obesity   . Tobacco use disorder   . High blood pressure   . Pulmonary embolism ([place]) 04/2013  . OSA on CPAP   . Pregnancy     09/20/14 17 weeks  . Fibroid tumor     has PE (pulmonary embolism); Pleuritic chest pain; SOB (shortness of breath); Hyperglycemia; Anemia; Tobacco use disorder; Saddle embolus of pulmonary artery with acute cor pulmonale ([place]); Secondary pulmonary hypertension ([place]);  Obstructive sleep apnea; Pulmonary embolism ([place]); Back pain with left-sided radiculopathy; Iron deficiency anemia due to chronic blood loss; Supervision of other high-risk pregnancy; Benign essential hypertension; Rubella non-immune status, antepartum; High risk multigravida; Morbid obesity ([place]); Fibroid, uterine; AMA (advanced maternal age) multigravida 76+; Hypertension affecting pregnancy; and Polyhydramnios in third trimester on her problem list.     is allergic to sulfa antibiotics.  [name] [name] does not currently have medications on file.  SURGICAL HISTORY: Past Surgical History  Procedure Laterality Date  . Cyst removal trunk    . Abscess drainage Bilateral 01/10/15    SOCIAL HISTORY: Social History   Social History  . Marital Status: Widowed    Spouse Name: N/A  . Number of Children: N/A  . Years of Education: N/A   Occupational History  . Not on file.   Social History Main Topics  . Smoking status: Former Smoker -- 0.25 packs/day for 18 years    Types: Cigarettes  . Smokeless tobacco: Never Used  . Alcohol Use: No  . Drug Use: No  . Sexual Activity: Yes    Birth Control/ Protection: None   Other Topics Concern  . Not on file   Social History Narrative    FAMILY HISTORY: Family History  Problem Relation Age of Onset  . Hypertension Mother   . Diabetes Paternal Aunt   . Diabetes Paternal Uncle   . Other Father     blood clots  .  Cancer Maternal Grandmother   . Heart attack Paternal Grandmother     Review of Systems  Constitutional: Negative.   HENT: Negative.   Eyes: Negative for blurred vision, double vision, photophobia, pain, discharge and redness.       Wears glasses  Respiratory:Negative for cough, hemoptysis, sputum production and wheezing.   Cardiovascular: Negative.   Gastrointestinal: Positive for heartburn. Negative for nausea, vomiting, abdominal pain, diarrhea, constipation, blood in stool and melena.  Genitourinary: Negative.     Musculoskeletal: Positive for back pain and joint pain. Negative for myalgias, falls and neck pain.  Skin: Negative.   Neurological: Negative.   Endo/Heme/Allergies: Negative.   Psychiatric/Behavioral: Negative.   14 point review of systems was performed and is negative except as detailed under history of present illness and above   PHYSICAL EXAMINATION  ECOG PERFORMANCE STATUS: 1 - Symptomatic but completely ambulatory  Filed Vitals:   01/11/15 0900  BP: 140/85  Pulse: 77  Temp: 97.8 F (36.6 C)  Resp: 16    Physical Exam  Constitutional: She is oriented to person, place, and time and well-developed, well-nourished, and in no distress.  Obese HENT:  Head: Normocephalic and atraumatic.  Nose: Nose normal.  Mouth/Throat: Oropharynx is clear and moist. No oropharyngeal exudate.  Eyes: Conjunctivae and EOM are normal. Pupils are equal, round, and reactive to light. Right eye exhibits no discharge. Left eye exhibits no discharge. No scleral icterus.  Neck: Normal range of motion. Neck supple. No tracheal deviation present. No thyromegaly present.  Cardiovascular: Normal rate, regular rhythm and normal heart sounds.  Exam reveals no gallop and no friction rub.   No murmur heard. Pulmonary/Chest: Effort normal and breath sounds normal. She has no wheezes. She has no rales.  Abdominal: Soft. Bowel sounds are normal. She exhibits no distension and no mass. There is no tenderness. There is no rebound and no guarding. 2 abdominal bandages in place at sites of prior I&D Musculoskeletal: Normal range of motion. She exhibits no edema.  Lymphadenopathy:    She has no cervical adenopathy.  Neurological: She is alert and oriented to person, place, and time. She has normal reflexes. No cranial nerve deficit. Gait normal. Coordination normal.  Skin: Skin is warm and dry. No rash noted.  Psychiatric: Mood, memory, affect and judgment normal.  Nursing note and vitals reviewed.   LABORATORY  DATA: I have reviewed the data as listed.  CBC    Component Value Date/Time   WBC 6.0 01/10/2015 1739   WBC 5.4 12/22/2014 0838   RBC 3.66* 01/10/2015 1739   RBC 3.92 12/22/2014 0838   HGB 11.6* 01/10/2015 1739   HCT 35.2* 01/10/2015 1739   HCT 37.4 12/22/2014 0838   PLT 184 01/10/2015 1739   MCV 96.2 01/10/2015 1739   MCH 31.7 01/10/2015 1739   MCH 31.6 12/22/2014 0838   MCHC 33.0 01/10/2015 1739   MCHC 33.2 12/22/2014 0838   RDW 14.2 01/10/2015 1739   RDW 15.2 12/22/2014 0838   LYMPHSABS 1.1 09/20/2014 0922   MONOABS 0.4 09/20/2014 0922   EOSABS 0.1 09/20/2014 0922   BASOSABS 0.0 09/20/2014 0922   CMP     Component Value Date/Time   NA 138 01/10/2015 1739   NA 137 12/12/2014 1645   K 4.3 01/10/2015 1739   CL 104 01/10/2015 1739   CO2 25 01/10/2015 1739   GLUCOSE 99 01/10/2015 1739   GLUCOSE 72 12/12/2014 1645   BUN 12 01/10/2015 1739   BUN 7 12/12/2014 1645  CREATININE 0.61 01/10/2015 1739   CALCIUM 9.5 01/10/2015 1739   PROT 7.6 01/10/2015 1739   PROT 7.4 12/12/2014 1645   ALBUMIN 3.1* 01/10/2015 1739   ALBUMIN 3.7 12/12/2014 1645   AST 16 01/10/2015 1739   ALT 15 01/10/2015 1739   ALKPHOS 85 01/10/2015 1739   BILITOT 0.5 01/10/2015 1739   BILITOT <0.2 12/12/2014 1645   GFRNONAA >60 01/10/2015 1739   GFRAA >60 01/10/2015 1739      RADIOGRAPHIC STUDIES: EXAM: CT ANGIOGRAPHY CHEST WITH CONTRAST  IMPRESSION: Positive for acute PE with CTevidence of right heart strain (RV/LV Ratio = 1.65 ) consistent with at least submassive (intermediate risk) PE. The presence of right heart strain has been associated with an increased risk of morbidity and mortality. Critical Value/emergent results were called by telephone at the time of interpretation on 04/27/2013 at 6:19 PM to Dr. Milton Ferguson , who verbally acknowledged these results.   Electronically Signed  By: Margaree Mackintosh M.D.  On: 04/27/2013 18:20 IMPRESSION: Positive for acute PE with  CTevidence of right heart strain (RV/LV Ratio = 1.65 ) consistent with at least submassive (intermediate risk) PE. The presence of right heart strain has been associated with an increased risk of morbidity and mortality. Critical Value/emergent results were called by telephone at the time of interpretation on 04/27/2013 at 6:19 PM to Dr. Milton Ferguson , who verbally acknowledged these results.   Electronically Signed  By: Margaree Mackintosh M.D.  On: 04/27/2013 18:20    ASSESSMENT and THERAPY PLAN:  Anemia, iron deficiency Large Saddle PE Tobacco Use Multiple pregnancy losses Currently [redacted] weeks pregnant.  Estimated delivery of 03/02/2015  Her anemia has completely normalized with iron replacement. I have encouraged her to continue with routine lab monitoring of her anemia and iron levels. Her O2 use has declined and she has days where she no longer requires O2 since correcting her anemia.  During her pregnancy she is on niferex and a prenatal vitamin. If she needs IV iron replacement we will notify her.  She is currently on lovenox.  This is the first pregnancy she has been able to carry to term.  As she gets closer to delivery she will need to be switched to heparin.   From Up to date:  Concerns about excess bleeding in a patient receiving low molecular weight (LMW) or unfractionated heparin apply only to the mother, since heparins do not cross the placenta. Patients should be educated about the signs and symptoms of bleeding and instructed to contact their provider in the event of a bleed.  Other than appropriate dosing and monitoring, strategies to decrease the risk of bleeding include the following: ?Convert from subcutaneous LMW heparin to unfractionated heparin prior to delivery, and from subcutaneous unfractionated heparin to intravenous unfractionated heparin prior to anticipated delivery in those who require more continuous anticoagulation. Discontinue heparin at the onset of  labor. Maximum control of anticoagulation can be achieved if the timing of delivery is planned (scheduled cesarean or induction of labor). (See 'Switch to unfractionated heparin' below.) ?If preterm labor develops in a patient receiving heparin, protamine sulfate has been used to reverse maternal heparinization. However, it is best to avoid administration of protamine antepartum unless hemorrhage cannot be controlled using routine supportive measures. (See "Heparin and LMW heparin: Dosing and adverse effects", section on 'Reversal (protamine)' and "Heparin and LMW heparin: Dosing and adverse effects", section on 'Bleeding'.) ?Place or remove a neuraxial needle or catheter only after the patient is no longer anticoagulated  She will come back the third week in January, and we will touch base about her blood thinner. I will also touch base with Dr. Glo Herring about her anticoagulation.  She will need to resume anticoagulation after delivery.  She will need to return to [facility] to complete her prior consultation.   She will need to communicate with me as her due date approaches, to switch over from Lovenox to Heparin. I strongly prefer that she continues on blood thinners up until she delivers.   All questions were answered. The patient knows to call the clinic with any problems, questions or concerns. We can certainly see the patient much sooner if necessary.  This document serves as a record of services personally performed by [name], MD. It was created on her behalf by Toni Amend, a trained medical scribe. The creation of this record is based on the scribe's personal observations and the provider's statements to them. This document has been checked and approved by the attending provider.     I have reviewed the above documentation for accuracy and completeness and I agree with the above. Donald Pore MD

## 2015-01-11 NOTE — Patient Instructions (Addendum)
[place] at [facility] Discharge Instructions  RECOMMENDATIONS MADE BY THE CONSULTANT AND ANY TEST RESULTS WILL BE SENT TO YOUR REFERRING PHYSICIAN.   Exam completed by Dr Whitney Muse today Return to see the doctor in middle of January We will call you with the ferritin level  We are going to talk to Dr Glo Herring to see if we need to switch you to heparin Please call the clinic if you have any questions or concerns    Thank you for choosing [place] at [facility] to provide your oncology and hematology care.  To afford each patient quality time with our provider, please arrive at least 15 minutes before your scheduled appointment time.    You need to re-schedule your appointment should you arrive 10 or more minutes late.  We strive to give you quality time with our providers, and arriving late affects you and other patients whose appointments are after yours.  Also, if you no show three or more times for appointments you may be dismissed from the clinic at the providers discretion.     Again, thank you for choosing [facility].  Our hope is that these requests will decrease the amount of time that you wait before being seen by our physicians.       _____________________________________________________________  Should you have questions after your visit to [facility], please contact our office at [phone] between the hours of 8:30 a.m. and 4:30 p.m.  Voicemails left after 4:30 p.m. will not be returned until the following business day.  For prescription refill requests, have your pharmacy contact our office.

## 2015-01-11 NOTE — Progress Notes (Signed)
[name] [name] presented for labwork. Labs per MD order drawn via Peripheral Line 23 gauge needle inserted in right forearm  Good blood return present. Procedure without incident.  Needle removed intact. Patient tolerated procedure well.

## 2015-01-13 ENCOUNTER — Ambulatory Visit (INDEPENDENT_AMBULATORY_CARE_PROVIDER_SITE_OTHER): Payer: Medicaid Other | Admitting: Obstetrics & Gynecology

## 2015-01-13 VITALS — BP 156/88 | HR 60 | Wt 384.0 lb

## 2015-01-13 DIAGNOSIS — Z1389 Encounter for screening for other disorder: Secondary | ICD-10-CM | POA: Diagnosis not present

## 2015-01-13 DIAGNOSIS — L02211 Cutaneous abscess of abdominal wall: Secondary | ICD-10-CM | POA: Diagnosis not present

## 2015-01-13 DIAGNOSIS — O09893 Supervision of other high risk pregnancies, third trimester: Secondary | ICD-10-CM

## 2015-01-13 DIAGNOSIS — O88013 Air embolism in pregnancy, third trimester: Secondary | ICD-10-CM | POA: Diagnosis not present

## 2015-01-13 DIAGNOSIS — O10913 Unspecified pre-existing hypertension complicating pregnancy, third trimester: Secondary | ICD-10-CM | POA: Diagnosis not present

## 2015-01-13 DIAGNOSIS — Z331 Pregnant state, incidental: Secondary | ICD-10-CM | POA: Diagnosis not present

## 2015-01-13 LAB — POCT URINALYSIS DIPSTICK
GLUCOSE UA: NEGATIVE
KETONES UA: NEGATIVE
Leukocytes, UA: NEGATIVE
Nitrite, UA: NEGATIVE
Protein, UA: NEGATIVE
RBC UA: NEGATIVE

## 2015-01-13 LAB — WOUND CULTURE
Culture: NO GROWTH
SPECIAL REQUESTS: NORMAL

## 2015-01-13 MED ORDER — CLINDAMYCIN HCL 300 MG PO CAPS: 600.0000 mg | ORAL_CAPSULE | Freq: Three times a day (TID) | ORAL | Status: DC

## 2015-01-13 NOTE — Progress Notes (Signed)
Fetal Surveillance Testing today:  Reactive NST   High Risk Pregnancy Diagnosis(es):   Chronic hypertension, history of pulmonary emboli on lovenox  [id number] [redacted]w[redacted]d Estimated Date of Delivery: 03/02/15  Blood pressure 156/88, pulse 60, weight 384 lb (174.181 kg), last menstrual period 05/23/2014.  Urinalysis: Negative   HPI: The patient is being seen today for ongoing management of CHTN, hx of PE. Today she reports no complaints   BP weight and urine results all reviewed and noted. Patient reports good fetal movement, denies any bleeding and no rupture of membranes symptoms or regular contractions.  Fundal Height:  U+22 Fetal Heart rate:  145 Edema:  1+  Patient is without complaints other than noted in her HPI. All questions were answered.  All lab and sonogram results have been reviewed. Comments:   Assessment:  1.  Pregnancy at [redacted]w[redacted]d,  Estimated Date of Delivery: 03/02/15 :                          2.  CHTN                        3.  Hx of PE                        4.  Abdominal wall abscesses from lovenox injections  Medication(s) Plans:  Labetalol 600 TID, norvasc 10 qd, lovenox 150 BID, cleocin  Treatment Plan:  Add silvaden topically and pre medicate before injections  No Follow-up on file. for appointment for high risk OB care  Meds ordered this encounter  Medications  . clindamycin (CLEOCIN) 300 MG capsule    Sig: Take 2 capsules (600 mg total) by mouth 3 (three) times daily.    Dispense:  60 capsule    Refill:  1   Orders Placed This Encounter  Procedures  . POCT urinalysis dipstick

## 2015-01-13 NOTE — Progress Notes (Signed)
Pt here today for follow up from the hospital

## 2015-01-16 ENCOUNTER — Inpatient Hospital Stay (HOSPITAL_COMMUNITY)
Admission: AD | Admit: 2015-01-16 | Discharge: 2015-01-16 | Disposition: A | Discharge status: Home / Self Care | Payer: Medicaid Other | Source: Ambulatory Visit | Attending: Obstetrics and Gynecology | Admitting: Obstetrics and Gynecology

## 2015-01-16 DIAGNOSIS — L02211 Cutaneous abscess of abdominal wall: Secondary | ICD-10-CM | POA: Insufficient documentation

## 2015-01-16 DIAGNOSIS — Z4800 Encounter for change or removal of nonsurgical wound dressing: Secondary | ICD-10-CM | POA: Diagnosis not present

## 2015-01-16 DIAGNOSIS — Z86711 Personal history of pulmonary embolism: Secondary | ICD-10-CM | POA: Diagnosis not present

## 2015-01-16 DIAGNOSIS — I10 Essential (primary) hypertension: Secondary | ICD-10-CM

## 2015-01-16 DIAGNOSIS — O9989 Other specified diseases and conditions complicating pregnancy, childbirth and the puerperium: Secondary | ICD-10-CM | POA: Insufficient documentation

## 2015-01-16 DIAGNOSIS — Z3A33 33 weeks gestation of pregnancy: Secondary | ICD-10-CM | POA: Insufficient documentation

## 2015-01-16 DIAGNOSIS — Z8249 Family history of ischemic heart disease and other diseases of the circulatory system: Secondary | ICD-10-CM | POA: Insufficient documentation

## 2015-01-16 DIAGNOSIS — Z7901 Long term (current) use of anticoagulants: Secondary | ICD-10-CM | POA: Diagnosis not present

## 2015-01-16 DIAGNOSIS — G4733 Obstructive sleep apnea (adult) (pediatric): Secondary | ICD-10-CM | POA: Insufficient documentation

## 2015-01-16 DIAGNOSIS — Z882 Allergy status to sulfonamides status: Secondary | ICD-10-CM | POA: Diagnosis not present

## 2015-01-16 DIAGNOSIS — Z87891 Personal history of nicotine dependence: Secondary | ICD-10-CM | POA: Insufficient documentation

## 2015-01-16 DIAGNOSIS — Z79899 Other long term (current) drug therapy: Secondary | ICD-10-CM | POA: Diagnosis not present

## 2015-01-16 NOTE — Discharge Instructions (Signed)
Dressing Change °A dressing is a material placed over wounds. It keeps the wound clean, dry, and protected from further injury. This provides an environment that favors wound healing.  °BEFORE YOU BEGIN °· Get your supplies together. Things you may need include: °¨ Saline solution. °¨ Flexible gauze dressing. °¨ Medicated cream. °¨ Tape. °¨ Gloves. °¨ Abdominal dressing pads. °¨ Gauze squares. °¨ Plastic bags. °· Take pain medicine 30 minutes before the dressing change if you need it. °· Take a shower before you do the first dressing change of the day. Use plastic wrap or a plastic bag to prevent the dressing from getting wet. °REMOVING YOUR OLD DRESSING  °· Wash your hands with soap and water. Dry your hands with a clean towel. °· Put on your gloves. °· Remove any tape. °· Carefully remove the old dressing. If the dressing sticks, you may dampen it with warm water to loosen it, or follow your caregiver's specific directions. °· Remove any gauze or packing tape that is in your wound. °· Take off your gloves. °· Put the gloves, tape, gauze, or any packing tape into a plastic bag. °CHANGING YOUR DRESSING °· Open the supplies. °· Take the cap off the saline solution. °· Open the gauze package so that the gauze remains on the inside of the package. °· Put on your gloves. °· Clean your wound as told by your caregiver. °· If you have been told to keep your wound dry, follow those instructions. °· Your caregiver may tell you to do one or more of the following: °¨ Pick up the gauze. Pour the saline solution over the gauze. Squeeze out the extra saline solution. °¨ Put medicated cream or other medicine on your wound if you have been told to do so. °¨ Put the solution soaked gauze only in your wound, not on the skin around it. °¨ Pack your wound loosely or as told by your caregiver. °¨ Put dry gauze on your wound. °¨ Put abdominal dressing pads over the dry gauze if your wet gauze soaks through. °· Tape the abdominal dressing  pads in place so they will not fall off. Do not wrap the tape completely around the affected part (arm, leg, abdomen). °· Wrap the dressing pads with a flexible gauze dressing to secure it in place. °· Take off your gloves. Put them in the plastic bag with the old dressing. Tie the bag shut and throw it away. °· Keep the dressing clean and dry until your next dressing change. °· Wash your hands. °SEEK MEDICAL CARE IF: °· Your skin around the wound looks red. °· Your wound feels more tender or sore. °· You see pus in the wound. °· Your wound smells bad. °· You have a fever. °· Your skin around the wound has a rash that itches and burns. °· You see black or yellow skin in your wound that was not there before. °· You feel nauseous, throw up, and feel very tired. °  °This information is not intended to replace advice given to you by your health care provider. Make sure you discuss any questions you have with your health care provider. °  °Document Released: 02/15/2004 Document Revised: 04/01/2011 Document Reviewed: 11/19/2010 °Elsevier Interactive Patient Education ©2016 [employer] ° °

## 2015-01-16 NOTE — MAU Provider Note (Signed)
History     CSN: [id number]  Arrival date and time: 01/16/15 [id number]   First Provider Initiated Contact with Patient 01/16/15 1949      No chief complaint on file.  HPI  [name] [name] 39 y.o. [id number] [redacted]w[redacted]d presents to the MAU for a dressing change from I/D procedure last week for abdominal abscesses x 3 from Lovenox shots. Pt is currently taking antibiotics.  Past Medical History  Diagnosis Date  . Closed left ankle fracture     August 30 2012  . Obesity   . Tobacco use disorder   . High blood pressure   . Pulmonary embolism ([place]) 04/2013  . OSA on CPAP   . Pregnancy     09/20/14 17 weeks  . Fibroid tumor     Past Surgical History  Procedure Laterality Date  . Cyst removal trunk    . Abscess drainage Bilateral 01/10/15    Family History  Problem Relation Age of Onset  . Hypertension Mother   . Diabetes Paternal Aunt   . Diabetes Paternal Uncle   . Other Father     blood clots  . Cancer Maternal Grandmother   . Heart attack Paternal Grandmother     Social History  Substance Use Topics  . Smoking status: Former Smoker -- 0.25 packs/day for 18 years    Types: Cigarettes  . Smokeless tobacco: Never Used  . Alcohol Use: No    Allergies:  Allergies  Allergen Reactions  . Sulfa Antibiotics Swelling    Lips swelling, rash, itching    Prescriptions prior to admission  Medication Sig Dispense Refill Last Dose  . amLODipine (NORVASC) 10 MG tablet Take 1 tablet (10 mg total) by mouth daily. 30 tablet 3 Taking  . amoxicillin-clavulanate (AUGMENTIN) 875-125 MG tablet Take 1 tablet by mouth 2 (two) times daily. 20 tablet 0 Taking  . clindamycin (CLEOCIN) 300 MG capsule Take 2 capsules (600 mg total) by mouth 3 (three) times daily. 60 capsule 1   . enoxaparin (LOVENOX) 150 MG/ML injection Inject 1.04 mLs (155 mg total) into the skin every 12 (twelve) hours. 60 Syringe 3 Taking  . Iron Polysacch Cmplx-B12-FA 150-0.025-1 MG CAPS Take 1 capsule by mouth daily. 30 each 6  Taking  . labetalol (NORMODYNE) 200 MG tablet Take 3 tablets (600 mg total) by mouth 2 (two) times daily. 84 tablet 2 Taking  . Prenatal Vit-Fe Fumarate-FA (PRENATAL COMPLETE) 14-0.4 MG TABS Take 1 tablet by mouth daily. 100 each 3 Taking    Review of Systems  Constitutional: Negative for fever.  Skin:       Abdominal abcess x 3 ; healing;   All other systems reviewed and are negative.  Physical Exam   Blood pressure 154/88, pulse 80, temperature 98.4 F (36.9 C), temperature source Oral, resp. rate 18, last menstrual period 05/23/2014, SpO2 99 %.  Physical Exam  Nursing note and vitals reviewed. Constitutional: She is oriented to person, place, and time. She appears well-developed and well-nourished. No distress.  HENT:  Head: Normocephalic and atraumatic.  Cardiovascular: Normal rate.   Respiratory: Effort normal and breath sounds normal. No respiratory distress.  GI: Soft. There is no tenderness.  Musculoskeletal: Normal range of motion.  Neurological: She is alert and oriented to person, place, and time.  Skin: Skin is warm and dry.  Psychiatric: She has a normal mood and affect. Her behavior is normal. Judgment and thought content normal.    MAU Course  Procedures  MDM Previous abscesses healing  well. Drying, non fluctuant. Largest abcess has been packed and is currently healing well with no drainage. FHT's Cat 1; no contractions  Assessment and Plan  Abdominal Abcess Change dressings [facility] Discharge  Clemmons,Lori Grissett 01/16/2015, 7:55 PM

## 2015-01-16 NOTE — MAU Note (Signed)
Pt reports she is here to have her bandage changed from procedure done last week. Denies problems at this time.

## 2015-01-17 ENCOUNTER — Telehealth (HOSPITAL_COMMUNITY): Payer: Self-pay | Admitting: Emergency Medicine

## 2015-01-17 ENCOUNTER — Encounter (HOSPITAL_COMMUNITY): Payer: Self-pay | Admitting: Hematology & Oncology

## 2015-01-17 NOTE — Telephone Encounter (Signed)
Notified pt to take 1 tablet of niferex twice daily

## 2015-01-17 NOTE — Telephone Encounter (Signed)
-----   Message from Patrici Ranks, MD sent at 01/17/2015  4:44 PM EST ----- Increase niferex to one tablet twice daily. Dr.P

## 2015-01-18 ENCOUNTER — Ambulatory Visit (INDEPENDENT_AMBULATORY_CARE_PROVIDER_SITE_OTHER): Payer: Medicaid Other

## 2015-01-18 ENCOUNTER — Encounter: Payer: Self-pay | Admitting: Obstetrics and Gynecology

## 2015-01-18 ENCOUNTER — Ambulatory Visit (INDEPENDENT_AMBULATORY_CARE_PROVIDER_SITE_OTHER): Payer: Medicaid Other | Admitting: Obstetrics and Gynecology

## 2015-01-18 VITALS — BP 180/100 | HR 80 | Wt 385.6 lb

## 2015-01-18 DIAGNOSIS — Z1389 Encounter for screening for other disorder: Secondary | ICD-10-CM | POA: Diagnosis not present

## 2015-01-18 DIAGNOSIS — O10913 Unspecified pre-existing hypertension complicating pregnancy, third trimester: Secondary | ICD-10-CM | POA: Diagnosis not present

## 2015-01-18 DIAGNOSIS — O3413 Maternal care for benign tumor of corpus uteri, third trimester: Secondary | ICD-10-CM

## 2015-01-18 DIAGNOSIS — Z3A34 34 weeks gestation of pregnancy: Secondary | ICD-10-CM | POA: Diagnosis not present

## 2015-01-18 DIAGNOSIS — O88013 Air embolism in pregnancy, third trimester: Secondary | ICD-10-CM

## 2015-01-18 DIAGNOSIS — Z331 Pregnant state, incidental: Secondary | ICD-10-CM

## 2015-01-18 DIAGNOSIS — O09893 Supervision of other high risk pregnancies, third trimester: Secondary | ICD-10-CM

## 2015-01-18 DIAGNOSIS — O99213 Obesity complicating pregnancy, third trimester: Secondary | ICD-10-CM | POA: Diagnosis not present

## 2015-01-18 DIAGNOSIS — I10 Essential (primary) hypertension: Secondary | ICD-10-CM

## 2015-01-18 DIAGNOSIS — O403XX0 Polyhydramnios, third trimester, not applicable or unspecified: Secondary | ICD-10-CM

## 2015-01-18 LAB — POCT URINALYSIS DIPSTICK
Blood, UA: NEGATIVE
GLUCOSE UA: NEGATIVE
KETONES UA: NEGATIVE
Leukocytes, UA: NEGATIVE
NITRITE UA: NEGATIVE
Protein, UA: NEGATIVE

## 2015-01-18 MED ORDER — BETAMETHASONE SOD PHOS & ACET 6 (3-3) MG/ML IJ SUSP
12.0000 mg | Freq: Every day | INTRAMUSCULAR | Status: AC
  Administered 2015-01-18: 12 mg via INTRAMUSCULAR

## 2015-01-18 NOTE — Patient Instructions (Signed)
Take your labetalol 3 times daily today, upon arrival home, and again before bedtime, as well as in the a.m. Return in a.m 10 am for recheck and results. You may well need hospitalization

## 2015-01-18 NOTE — Progress Notes (Signed)
[place] 33+6wks,cephalic,cx [id number] Q000111Q bpm,post pl gr 2,afi 17.4cm, BPP 8/8,RI .69,.72,mult fibroids n/c

## 2015-01-18 NOTE — Progress Notes (Signed)
High Risk Pregnancy Diagnosis(es):   CHTN, ut fibroids, morbid obesity, polyhydramnios(resolved).  [id number] [redacted]w[redacted]d Estimated Date of Delivery: 03/02/15  HPI: The patient is being seen today for ongoing management of Chtn, hi risk preg,. Today she reports she is without headache, scotoma or any other PIH sx. Patient reports good fetal movement, denies any bleeding and no rupture of membranes symptoms or regular contractions.   BP weight and urine results all reviewed and noted as elevated. Pt will have piH labs including Pr/Cr ratio, give BMZ, increase labetalol to 600 tid, and see back in a.m. Blood pressure 170/90, pulse 76, weight 385 lb 9.6 oz (174.907 kg), last menstrual period 05/23/2014. recheck by me higher  Fetal Surveillance Testing today:  BPP 8/8 with AFI 17. Est fetal weight due next week Fundal Height:   Fetal Heart rate:  See BPP notes Edema:  neg2 Urinalysis: Negative   Questions were answered.  Lab and sonogram results have been reviewed. Comments: BPP 8/8 with AF vol normal at 17.  Weight planned for next wk  Assessment:  1.  Pregnancy at [redacted]w[redacted]d,  Estimated Date of Delivery: 03/02/15 :                          2.  CHTN , worsening, will increase Labetalol to 600 TID, and continue Norvasc 10 q am                        3.  Ut fibroids stable  Medication(s)/ Plans:  As above                                   PIH labs overnight                                  Recheck bp  in a.m.                                  Given first dose BMZ x 12 mg                                  Start recheck of 24 hr TP  Follow up in 1day for appointment for high risk OB care, bp check,

## 2015-01-19 ENCOUNTER — Ambulatory Visit (INDEPENDENT_AMBULATORY_CARE_PROVIDER_SITE_OTHER): Payer: Medicaid Other | Admitting: Advanced Practice Midwife

## 2015-01-19 ENCOUNTER — Ambulatory Visit (INDEPENDENT_AMBULATORY_CARE_PROVIDER_SITE_OTHER): Payer: Medicaid Other | Admitting: *Deleted

## 2015-01-19 ENCOUNTER — Telehealth (HOSPITAL_COMMUNITY): Payer: Self-pay | Admitting: Hematology & Oncology

## 2015-01-19 ENCOUNTER — Encounter: Payer: Self-pay | Admitting: Advanced Practice Midwife

## 2015-01-19 VITALS — BP 152/78

## 2015-01-19 VITALS — BP 158/92 | HR 80 | Wt 384.0 lb

## 2015-01-19 DIAGNOSIS — O09893 Supervision of other high risk pregnancies, third trimester: Secondary | ICD-10-CM | POA: Diagnosis not present

## 2015-01-19 DIAGNOSIS — Z331 Pregnant state, incidental: Secondary | ICD-10-CM

## 2015-01-19 DIAGNOSIS — Z1389 Encounter for screening for other disorder: Secondary | ICD-10-CM | POA: Diagnosis not present

## 2015-01-19 DIAGNOSIS — O163 Unspecified maternal hypertension, third trimester: Secondary | ICD-10-CM

## 2015-01-19 DIAGNOSIS — Z3A34 34 weeks gestation of pregnancy: Secondary | ICD-10-CM | POA: Diagnosis not present

## 2015-01-19 DIAGNOSIS — O09523 Supervision of elderly multigravida, third trimester: Secondary | ICD-10-CM

## 2015-01-19 DIAGNOSIS — O10913 Unspecified pre-existing hypertension complicating pregnancy, third trimester: Secondary | ICD-10-CM | POA: Diagnosis not present

## 2015-01-19 DIAGNOSIS — O99213 Obesity complicating pregnancy, third trimester: Secondary | ICD-10-CM

## 2015-01-19 LAB — CBC
HEMATOCRIT: 37.7 % (ref 34.0–46.6)
Hemoglobin: 12.5 g/dL (ref 11.1–15.9)
MCH: 31.6 pg (ref 26.6–33.0)
MCHC: 33.2 g/dL (ref 31.5–35.7)
MCV: 95 fL (ref 79–97)
PLATELETS: 214 10*3/uL (ref 150–379)
RBC: 3.95 x10E6/uL (ref 3.77–5.28)
RDW: 14.3 % (ref 12.3–15.4)
WBC: 5.9 10*3/uL (ref 3.4–10.8)

## 2015-01-19 LAB — POCT URINALYSIS DIPSTICK
Blood, UA: NEGATIVE
GLUCOSE UA: NEGATIVE
KETONES UA: NEGATIVE
LEUKOCYTES UA: NEGATIVE
NITRITE UA: NEGATIVE
Protein, UA: NEGATIVE

## 2015-01-19 LAB — PROTEIN / CREATININE RATIO, URINE
Creatinine, Urine: 32.9 mg/dL
PROTEIN/CREAT RATIO: 219 mg/g{creat} — AB (ref 0–200)
Protein, Ur: 7.2 mg/dL

## 2015-01-19 LAB — COMPREHENSIVE METABOLIC PANEL
A/G RATIO: 1.1 (ref 1.1–2.5)
ALT: 19 IU/L (ref 0–32)
AST: 17 IU/L (ref 0–40)
Albumin: 3.7 g/dL (ref 3.5–5.5)
Alkaline Phosphatase: 97 IU/L (ref 39–117)
BUN / CREAT RATIO: 17 (ref 8–20)
BUN: 9 mg/dL (ref 6–20)
CHLORIDE: 101 mmol/L (ref 96–106)
CO2: 21 mmol/L (ref 18–29)
Calcium: 9.4 mg/dL (ref 8.7–10.2)
Creatinine, Ser: 0.53 mg/dL — ABNORMAL LOW (ref 0.57–1.00)
GFR, EST AFRICAN AMERICAN: 139 mL/min/{1.73_m2} (ref >59)
GFR, EST NON AFRICAN AMERICAN: 121 mL/min/{1.73_m2} (ref >59)
GLOBULIN, TOTAL: 3.4 g/dL (ref 1.5–4.5)
Glucose: 71 mg/dL (ref 65–99)
POTASSIUM: 4.7 mmol/L (ref 3.5–5.2)
SODIUM: 139 mmol/L (ref 134–144)
TOTAL PROTEIN: 7.1 g/dL (ref 6.0–8.5)

## 2015-01-19 MED ORDER — BETAMETHASONE SOD PHOS & ACET 6 (3-3) MG/ML IJ SUSP
12.0000 mg | Freq: Once | INTRAMUSCULAR | Status: AC
  Administered 2015-01-19: 12 mg via INTRAMUSCULAR

## 2015-01-19 NOTE — Progress Notes (Signed)
[name] [name], CNM and Dr. Glo Herring both informed of elevated B/P. Pt states took Labetalol 30 minutes prior to appt. Per Dr. Glo Herring 30-60 minute on set of med. Pt to continue the Labetalol as prescribed. Pt verbalized understanding  BMTZ 12 mg IM given left deltoid with no complications.

## 2015-01-19 NOTE — Progress Notes (Signed)
4:20 PM Took Labetalol <30 minutes ago.  Dr. Glo Herring given report.  Ok to continue with 600 TID 2nd dose of BMZ in L deltoid.  24 hour urine turned in. dsg on right abdomnen changed and packed.  Dsg on left removed, well healed.

## 2015-01-19 NOTE — Telephone Encounter (Signed)
Pt was award free lovenox from [place] but no longer needs it because she has medicaid now.  SANOFI [phone]

## 2015-01-19 NOTE — Progress Notes (Signed)
    High Risk Pregnancy Diagnosis(es):   CHTN  [id number] [redacted]w[redacted]d Estimated Date of Delivery: 03/02/15  Blood pressure 138/70, pulse 80, weight 384 lb (174.181 kg), last menstrual period 05/23/2014.  Urinalysis: Not examined-collecting a 24 urine for TProtein  HPI: The patient is being seen today for ongoing management of CHTN.  She was seen yesterday and had BP of 180/100.  Labetalol was increased to TID dosing. Today she reports no complaints   BP weight and urine results all reviewed and noted. Patient reports good fetal movement, denies any bleeding and no rupture of membranes symptoms or regular contractions.  Fundal Height:  Not done Fetal Heart rate:  Active fetus this am Edema:  no  Patient is without complaints other than noted in her HPI. All questions were answered.  All lab and sonogram results have been reviewed. Comments:  [place]:  BPP/dopplers were all normal yesterday.  PreX labs normal  Assessment:  1.  Pregnancy at [redacted]w[redacted]d,  Estimated Date of Delivery: 03/02/15 :                          2.  CHTN:  BP within range today.  Do not want to drop BP too low.                        3.    Medication(s) Plans:  Continue Labetalol 600mg  TID and Norvasc 10mg  q am;  Come back this pm for 2nd BMZ dose, turn in 24 hour urine.  Will also check BP then   Treatment Plan:  Monday-Thursday testing   Return today at 4:15 for nurse, for Mondays for NST/HROB and Thurs for US/BPP and HROB. for appointment for high risk OB care  No orders of the defined types were placed in this encounter.   Orders Placed This Encounter  Procedures  . POCT urinalysis dipstick

## 2015-01-20 LAB — PROTEIN, URINE, 24 HOUR
PROTEIN 24H UR: 286 mg/(24.h) — AB (ref 30.0–150.0)
Protein, Ur: 10.4 mg/dL

## 2015-01-22 NOTE — L&D Delivery Note (Signed)
  Patient is 40 y.o. G4P0030 [redacted]w[redacted]d admitted for IOL for cHTN.    Delivery Note At 11:00 AM a viable female was delivered via Vaginal, Spontaneous Delivery (Presentation: Right Occiput Anterior).    APGAR: 9, 9; weight: pending.  Due to high risk of hemorrhage (Multigravida, Obesity, hx of Fibroids) given Cytotec 659mcg PR for prophylaxis.   Placenta status: Intact, Spontaneous.  Cord: 3V with the following complications: none .  Cord pH: not obtained  Anesthesia: Epidural  Episiotomy: None Lacerations: left periurethral abrasion, hemostatic Suture Repair: not required Est. Blood Loss (mL): 37  Mom to postpartum.  Baby to Couplet care / Skin to Skin.  Smiley Houseman 02/24/2015, 11:44 AM   Upon arrival patient was complete and pushing. She pushed with good maternal effort to deliver a healthy baby girl. Baby delivered without difficulty, was noted to have good tone and place on maternal abdomen for oral suctioning, drying and stimulation. Delayed cord clamping performed. Placenta delivered intact with 3V cord. Vaginal canal and perineum was inspected and left periurethral abrasion was noted which was hemostatic and did not require repair; hemostatic. Pitocin was started and uterus massaged until bleeding slowed. Counts of sharps, instruments, and lap pads were all correct.   Smiley Houseman, MD PGY 1 Family Medicine   I was present for this delivery and agree with the above note.  LEFTWICH-KIRBY, Lattie Haw, CNM 9:48 PM

## 2015-01-24 ENCOUNTER — Ambulatory Visit (INDEPENDENT_AMBULATORY_CARE_PROVIDER_SITE_OTHER): Payer: Medicaid Other | Admitting: Obstetrics & Gynecology

## 2015-01-24 ENCOUNTER — Encounter: Payer: Self-pay | Admitting: Obstetrics & Gynecology

## 2015-01-24 VITALS — BP 130/68 | HR 74 | Wt 370.0 lb

## 2015-01-24 DIAGNOSIS — O09893 Supervision of other high risk pregnancies, third trimester: Secondary | ICD-10-CM | POA: Diagnosis not present

## 2015-01-24 DIAGNOSIS — Z1389 Encounter for screening for other disorder: Secondary | ICD-10-CM

## 2015-01-24 DIAGNOSIS — Z331 Pregnant state, incidental: Secondary | ICD-10-CM | POA: Diagnosis not present

## 2015-01-24 DIAGNOSIS — O10913 Unspecified pre-existing hypertension complicating pregnancy, third trimester: Secondary | ICD-10-CM | POA: Diagnosis not present

## 2015-01-24 DIAGNOSIS — O88013 Air embolism in pregnancy, third trimester: Secondary | ICD-10-CM

## 2015-01-24 DIAGNOSIS — O163 Unspecified maternal hypertension, third trimester: Secondary | ICD-10-CM

## 2015-01-24 DIAGNOSIS — Z3A35 35 weeks gestation of pregnancy: Secondary | ICD-10-CM

## 2015-01-24 LAB — POCT URINALYSIS DIPSTICK
GLUCOSE UA: NEGATIVE
Ketones, UA: NEGATIVE
Leukocytes, UA: NEGATIVE
NITRITE UA: NEGATIVE
Protein, UA: NEGATIVE
RBC UA: NEGATIVE

## 2015-01-24 NOTE — Progress Notes (Signed)
Fetal Surveillance Testing today:  Reactive NST   High Risk Pregnancy Diagnosis(es):   Chronic hypertension, PE  G4P0030 [redacted]w[redacted]d Estimated Date of Delivery: 03/02/15  Blood pressure 130/68, pulse 74, weight 370 lb (167.831 kg), last menstrual period 05/23/2014.  Urinalysis: Negative   HPI: The patient is being seen today for ongoing management of as above. Today she reports no problems   BP weight and urine results all reviewed and noted. Patient reports good fetal movement, denies any bleeding and no rupture of membranes symptoms or regular contractions.  Fundal Height:   Fetal Heart rate:  135 Edema:  2+  Patient is without complaints other than noted in her HPI. All questions were answered.  All lab and sonogram results have been reviewed. Comments: abnormal:    Assessment:  1.  Pregnancy at [redacted]w[redacted]d,  Estimated Date of Delivery: 03/02/15 :                          2.  Chronic Hypertension                        3.  PE                            Injection site abscesses  Medication(s) Plans:  Continue labetalol and lovenox  Treatment Plan:  Twice weekly tests  Return in about 3 days (around 01/27/2015) for BPP/sono, HROB, with Dr Elonda Husky. for appointment for high risk OB care  No orders of the defined types were placed in this encounter.   Orders Placed This Encounter  Procedures  . POCT urinalysis dipstick

## 2015-01-25 ENCOUNTER — Other Ambulatory Visit: Payer: Self-pay | Admitting: Obstetrics & Gynecology

## 2015-01-25 DIAGNOSIS — O163 Unspecified maternal hypertension, third trimester: Secondary | ICD-10-CM

## 2015-01-25 DIAGNOSIS — O09523 Supervision of elderly multigravida, third trimester: Secondary | ICD-10-CM

## 2015-01-25 DIAGNOSIS — D259 Leiomyoma of uterus, unspecified: Secondary | ICD-10-CM

## 2015-01-25 DIAGNOSIS — O99213 Obesity complicating pregnancy, third trimester: Secondary | ICD-10-CM

## 2015-01-25 DIAGNOSIS — O3413 Maternal care for benign tumor of corpus uteri, third trimester: Secondary | ICD-10-CM

## 2015-01-26 ENCOUNTER — Other Ambulatory Visit: Payer: Medicaid Other

## 2015-01-26 ENCOUNTER — Encounter: Payer: Medicaid Other | Admitting: Obstetrics & Gynecology

## 2015-01-31 ENCOUNTER — Ambulatory Visit (INDEPENDENT_AMBULATORY_CARE_PROVIDER_SITE_OTHER): Payer: Medicaid Other | Admitting: Obstetrics & Gynecology

## 2015-01-31 ENCOUNTER — Encounter: Payer: Self-pay | Admitting: Obstetrics & Gynecology

## 2015-01-31 ENCOUNTER — Ambulatory Visit (INDEPENDENT_AMBULATORY_CARE_PROVIDER_SITE_OTHER): Payer: Medicaid Other

## 2015-01-31 VITALS — BP 122/70 | HR 74 | Wt 381.4 lb

## 2015-01-31 DIAGNOSIS — Z86711 Personal history of pulmonary embolism: Secondary | ICD-10-CM

## 2015-01-31 DIAGNOSIS — O403XX0 Polyhydramnios, third trimester, not applicable or unspecified: Secondary | ICD-10-CM

## 2015-01-31 DIAGNOSIS — O10913 Unspecified pre-existing hypertension complicating pregnancy, third trimester: Secondary | ICD-10-CM | POA: Diagnosis not present

## 2015-01-31 DIAGNOSIS — D259 Leiomyoma of uterus, unspecified: Secondary | ICD-10-CM

## 2015-01-31 DIAGNOSIS — Z331 Pregnant state, incidental: Secondary | ICD-10-CM

## 2015-01-31 DIAGNOSIS — O09523 Supervision of elderly multigravida, third trimester: Secondary | ICD-10-CM

## 2015-01-31 DIAGNOSIS — Z8759 Personal history of other complications of pregnancy, childbirth and the puerperium: Secondary | ICD-10-CM

## 2015-01-31 DIAGNOSIS — O99213 Obesity complicating pregnancy, third trimester: Secondary | ICD-10-CM

## 2015-01-31 DIAGNOSIS — O09893 Supervision of other high risk pregnancies, third trimester: Secondary | ICD-10-CM | POA: Diagnosis not present

## 2015-01-31 DIAGNOSIS — Z3A36 36 weeks gestation of pregnancy: Secondary | ICD-10-CM

## 2015-01-31 DIAGNOSIS — O163 Unspecified maternal hypertension, third trimester: Secondary | ICD-10-CM

## 2015-01-31 DIAGNOSIS — Z1389 Encounter for screening for other disorder: Secondary | ICD-10-CM

## 2015-01-31 DIAGNOSIS — O3413 Maternal care for benign tumor of corpus uteri, third trimester: Secondary | ICD-10-CM | POA: Diagnosis not present

## 2015-01-31 DIAGNOSIS — I10 Essential (primary) hypertension: Secondary | ICD-10-CM

## 2015-01-31 DIAGNOSIS — O09891 Supervision of other high risk pregnancies, first trimester: Secondary | ICD-10-CM

## 2015-01-31 LAB — POCT URINALYSIS DIPSTICK
GLUCOSE UA: NEGATIVE
Ketones, UA: NEGATIVE
Leukocytes, UA: NEGATIVE
Nitrite, UA: NEGATIVE
Protein, UA: NEGATIVE
RBC UA: NEGATIVE

## 2015-01-31 NOTE — Progress Notes (Signed)
Fetal Surveillance Testing today:  BPP 8/8 Dopplers good   High Risk Pregnancy Diagnosis(es):   CHTN  G4P0030 [redacted]w[redacted]d Estimated Date of Delivery: 03/02/15  Blood pressure 122/70, pulse 74, weight 381 lb 6.4 oz (173.002 kg), last menstrual period 05/23/2014.  Urinalysis: Negative   HPI: The patient is being seen today for ongoing management of chronic hypertension. Today she reports feeling good   BP weight and urine results all reviewed and noted. Patient reports good fetal movement, denies any bleeding and no rupture of membranes symptoms or regular contractions.  Fundal Height:  U+26 Fetal Heart rate:  141 Edema:  1+  Patient is without complaints other than noted in her HPI. All questions were answered.  All lab and sonogram results have been reviewed. Comments: abnormal:    Assessment:  1.  Pregnancy at [redacted]w[redacted]d,  Estimated Date of Delivery: 03/02/15 :                          2.  Chronic hYpertension                        3.  Abdominal wall abscesses: resolved  Medication(s) Plans:  No changes labetalol 600 TID + norvasc 10 qd  Treatment Plan:  Twice weekly surveilllance  No Follow-up on file. for appointment for high risk OB care  No orders of the defined types were placed in this encounter.   Orders Placed This Encounter  Procedures  . POCT urinalysis dipstick

## 2015-01-31 NOTE — Progress Notes (Signed)
[place] 35+5wks,cephalic,post pl gr 2,BPP 8/8,bilat adnexa's wnl,fhr 141 bpm,RI .57,.59,AFI 13.6cm

## 2015-02-03 ENCOUNTER — Encounter: Payer: Self-pay | Admitting: Obstetrics & Gynecology

## 2015-02-03 ENCOUNTER — Ambulatory Visit (INDEPENDENT_AMBULATORY_CARE_PROVIDER_SITE_OTHER): Payer: Medicaid Other | Admitting: Obstetrics & Gynecology

## 2015-02-03 VITALS — BP 110/80 | HR 74 | Wt 385.2 lb

## 2015-02-03 DIAGNOSIS — O09893 Supervision of other high risk pregnancies, third trimester: Secondary | ICD-10-CM

## 2015-02-03 DIAGNOSIS — Z331 Pregnant state, incidental: Secondary | ICD-10-CM

## 2015-02-03 DIAGNOSIS — O10913 Unspecified pre-existing hypertension complicating pregnancy, third trimester: Secondary | ICD-10-CM

## 2015-02-03 DIAGNOSIS — I2699 Other pulmonary embolism without acute cor pulmonale: Secondary | ICD-10-CM

## 2015-02-03 DIAGNOSIS — Z1389 Encounter for screening for other disorder: Secondary | ICD-10-CM

## 2015-02-03 LAB — POCT URINALYSIS DIPSTICK
Blood, UA: NEGATIVE
Glucose, UA: NEGATIVE
KETONES UA: NEGATIVE
LEUKOCYTES UA: NEGATIVE
NITRITE UA: NEGATIVE
Protein, UA: NEGATIVE

## 2015-02-03 NOTE — Progress Notes (Signed)
Fetal Surveillance Testing today:  Reactive NST   High Risk Pregnancy Diagnosis(es):   Chronic hypertension  G4P0030 [redacted]w[redacted]d Estimated Date of Delivery: 03/02/15  Blood pressure 110/80, pulse 74, weight 385 lb 3.2 oz (174.726 kg), last menstrual period 05/23/2014.  Urinalysis: Negative   HPI: The patient is being seen today for ongoing management of chronic hypertension. Today she reports no complaints   BP weight and urine results all reviewed and noted. Patient reports good fetal movement, denies any bleeding and no rupture of membranes symptoms or regular contractions.  Fundal Height:  U+26 Fetal Heart rate:  140 Edema:  1+  Patient is without complaints other than noted in her HPI. All questions were answered.  All lab and sonogram results have been reviewed. Comments:    Assessment:  1.  Pregnancy at [redacted]w[redacted]d,  Estimated Date of Delivery: 03/02/15 :                          2.  Chronic hypertension                        3.  History of PE  Medication(s) Plans:  Continue current med therapy  Treatment Plan:  Twice weekly surveillance sonogram alt NST  No Follow-up on file. for appointment for high risk OB care  No orders of the defined types were placed in this encounter.   Orders Placed This Encounter  Procedures  . POCT urinalysis dipstick

## 2015-02-07 ENCOUNTER — Ambulatory Visit (INDEPENDENT_AMBULATORY_CARE_PROVIDER_SITE_OTHER): Payer: Medicaid Other

## 2015-02-07 DIAGNOSIS — Z3A37 37 weeks gestation of pregnancy: Secondary | ICD-10-CM

## 2015-02-07 DIAGNOSIS — O403XX0 Polyhydramnios, third trimester, not applicable or unspecified: Secondary | ICD-10-CM

## 2015-02-07 DIAGNOSIS — O10913 Unspecified pre-existing hypertension complicating pregnancy, third trimester: Secondary | ICD-10-CM | POA: Diagnosis not present

## 2015-02-07 DIAGNOSIS — I10 Essential (primary) hypertension: Secondary | ICD-10-CM

## 2015-02-07 DIAGNOSIS — O09893 Supervision of other high risk pregnancies, third trimester: Secondary | ICD-10-CM

## 2015-02-07 NOTE — Progress Notes (Signed)
[place] 36+5wks,cephalic,fhr 123456 bpm,post pl gr 2,afi 17.4cm,BPP 8/8,RI .62,.63

## 2015-02-09 ENCOUNTER — Other Ambulatory Visit: Payer: Self-pay | Admitting: Obstetrics and Gynecology

## 2015-02-10 ENCOUNTER — Ambulatory Visit (INDEPENDENT_AMBULATORY_CARE_PROVIDER_SITE_OTHER): Payer: Medicaid Other | Admitting: Obstetrics & Gynecology

## 2015-02-10 ENCOUNTER — Encounter: Payer: Self-pay | Admitting: Obstetrics & Gynecology

## 2015-02-10 VITALS — BP 130/80 | HR 100 | Wt 391.0 lb

## 2015-02-10 DIAGNOSIS — O10913 Unspecified pre-existing hypertension complicating pregnancy, third trimester: Secondary | ICD-10-CM

## 2015-02-10 DIAGNOSIS — O09893 Supervision of other high risk pregnancies, third trimester: Secondary | ICD-10-CM

## 2015-02-10 DIAGNOSIS — Z86711 Personal history of pulmonary embolism: Secondary | ICD-10-CM

## 2015-02-10 DIAGNOSIS — Z331 Pregnant state, incidental: Secondary | ICD-10-CM

## 2015-02-10 DIAGNOSIS — Z1389 Encounter for screening for other disorder: Secondary | ICD-10-CM

## 2015-02-10 DIAGNOSIS — Z8759 Personal history of other complications of pregnancy, childbirth and the puerperium: Secondary | ICD-10-CM

## 2015-02-10 LAB — POCT URINALYSIS DIPSTICK
Blood, UA: NEGATIVE
Glucose, UA: NEGATIVE
Ketones, UA: NEGATIVE
LEUKOCYTES UA: NEGATIVE
Nitrite, UA: NEGATIVE
PROTEIN UA: NEGATIVE

## 2015-02-13 ENCOUNTER — Encounter (HOSPITAL_COMMUNITY): Payer: Medicaid Other | Attending: Hematology & Oncology | Admitting: Hematology & Oncology

## 2015-02-13 ENCOUNTER — Other Ambulatory Visit: Payer: Self-pay | Admitting: Obstetrics & Gynecology

## 2015-02-13 ENCOUNTER — Encounter (HOSPITAL_COMMUNITY): Payer: Self-pay | Admitting: Hematology & Oncology

## 2015-02-13 VITALS — BP 170/97 | HR 84 | Temp 98.4°F | Resp 18

## 2015-02-13 DIAGNOSIS — D509 Iron deficiency anemia, unspecified: Secondary | ICD-10-CM | POA: Insufficient documentation

## 2015-02-13 DIAGNOSIS — I2602 Saddle embolus of pulmonary artery with acute cor pulmonale: Secondary | ICD-10-CM | POA: Diagnosis not present

## 2015-02-13 DIAGNOSIS — Z86711 Personal history of pulmonary embolism: Secondary | ICD-10-CM

## 2015-02-13 DIAGNOSIS — O09513 Supervision of elderly primigravida, third trimester: Secondary | ICD-10-CM

## 2015-02-13 DIAGNOSIS — O10913 Unspecified pre-existing hypertension complicating pregnancy, third trimester: Secondary | ICD-10-CM

## 2015-02-13 DIAGNOSIS — I2699 Other pulmonary embolism without acute cor pulmonale: Secondary | ICD-10-CM

## 2015-02-13 DIAGNOSIS — Z7901 Long term (current) use of anticoagulants: Secondary | ICD-10-CM | POA: Diagnosis not present

## 2015-02-13 DIAGNOSIS — O09523 Supervision of elderly multigravida, third trimester: Secondary | ICD-10-CM

## 2015-02-13 DIAGNOSIS — Z8759 Personal history of other complications of pregnancy, childbirth and the puerperium: Secondary | ICD-10-CM

## 2015-02-13 MED ORDER — HEPARIN SODIUM (PORCINE) 20000 UNIT/ML IJ SOLN: INTRAMUSCULAR | Status: DC

## 2015-02-13 NOTE — Telephone Encounter (Signed)
Pt has appt in am for BPP, will refil Rx for Labetalol, pt reports that she is currently on 400 mg tid plus the Norvasc.

## 2015-02-13 NOTE — Patient Instructions (Addendum)
[place] at [facility] Discharge Instructions  RECOMMENDATIONS MADE BY THE CONSULTANT AND ANY TEST RESULTS WILL BE SENT TO YOUR REFERRING PHYSICIAN.     Exam and discussion completed by Dr Whitney Muse today  We are going to switch you to unfractioned heparin.  You will do this twice a day.   We need to get a PTT on Thursday.  This needs to be done 6 hours after your 3rd  heparin shot that mornning.About 5 hours start making your way to the clinic so we can draw your blood work at 6 hours.  We will adjust this accordingly.  We will stop the heparin the day before you go in for induction on feb 2.  Return to see the doctor in 3 months with lab work  Please call the clinic if you have any questions or concerns       Thank you for choosing [place] at [facility] to provide your oncology and hematology care.  To afford each patient quality time with our provider, please arrive at least 15 minutes before your scheduled appointment time.   Beginning January 23rd 2017 lab work for the [facility] will be done in the  Main lab at [facility] on 1st floor. If you have a lab appointment with the [facility] please come in thru the  Main Entrance and check in at the main information desk  You need to re-schedule your appointment should you arrive 10 or more minutes late.  We strive to give you quality time with our providers, and arriving late affects you and other patients whose appointments are after yours.  Also, if you no show three or more times for appointments you may be dismissed from the clinic at the providers discretion.     Again, thank you for choosing [facility].  Our hope is that these requests will decrease the amount of time that you wait before being seen by our physicians.       _____________________________________________________________  Should you have questions after your visit to [facility], please contact our office at [phone] between the hours of 8:30 a.m. and 4:30 p.m.  Voicemails left after 4:30 p.m. will not be returned until the following business day.  For prescription refill requests, have your pharmacy contact our office.

## 2015-02-13 NOTE — Progress Notes (Signed)
[name] [address]  Pulmonary embolus,  CT 04/27/2013 saddle embolism IVC filter 04/29/2013 LE Ultrasound  2 miscarriages one at 9 weeks, 6 weeks Iron deficiency Anemia with pagophagia, heavy menses on Xarelto Smokes 2 to 3 cigarettes a day Estimated delivery of 03/02/2015  CURRENT THERAPY:Lovenox  INTERVAL HISTORY: [name] [name] 40 y.o. female returns for follow-up of her history of PE. She is also followed for iron deficiency anemia. She is currently pregnant and on Lovenox. She is doing well. [name] returns to the [facility] alone today.  [name] returns to the [facility]  She says she is due to be induced on February 2nd.   Her calf is hurting and she says "it's been hurting off and on for about months now, it's like a little throbbing pain in my back as well." She confirms that it is isolated to the left side, and denies anything new or unusual about it. She just comments that her left side is more swollen than the right, but this is an old problem.  She confirms that her breathing is fine and that, overall, she feels good. She says she's getting nervous because it's getting down to the last week or two before the delivery of her child. She goes in tomorrow for another sonogram.  She says she's experiencing high blood pressure today because she didn't have her blood pressure medication for several days. She notes a new prescription is being called in today.  MEDICAL HISTORY: Past Medical History  Diagnosis Date  . Closed left ankle fracture     August 30 2012  . Obesity   . Tobacco use disorder   . High blood pressure   . Pulmonary embolism ([place]) 04/2013  . OSA on CPAP   . Pregnancy     09/20/14 17 weeks  . Fibroid tumor     has PE (pulmonary embolism); Pleuritic chest pain; SOB (shortness of breath); Hyperglycemia; Anemia; Tobacco use disorder; Saddle embolus of pulmonary artery with acute cor  pulmonale ([place]); Secondary pulmonary hypertension ([place]); Obstructive sleep apnea; Pulmonary embolism ([place]); Back pain with left-sided radiculopathy; Iron deficiency anemia due to chronic blood loss; Supervision of other high-risk pregnancy; Benign essential hypertension; Rubella non-immune status, antepartum; High risk multigravida; Morbid obesity ([place]); Fibroid, uterine; AMA (advanced maternal age) multigravida 4+; Hypertension affecting pregnancy; and Polyhydramnios in third trimester on her problem list.     is allergic to sulfa antibiotics.  Ms. Carris does not currently have medications on file.  SURGICAL HISTORY: Past Surgical History  Procedure Laterality Date  . Cyst removal trunk    . Abscess drainage Bilateral 01/10/15    SOCIAL HISTORY: Social History   Social History  . Marital Status: Widowed    Spouse Name: N/A  . Number of Children: N/A  . Years of Education: N/A   Occupational History  . Not on file.   Social History Main Topics  . Smoking status: Former Smoker -- 0.25 packs/day for 18 years    Types: Cigarettes  . Smokeless tobacco: Never Used  . Alcohol Use: No  . Drug Use: No  . Sexual Activity: Yes    Birth Control/ Protection: None   Other Topics Concern  . Not on file   Social History Narrative    FAMILY HISTORY: Family History  Problem Relation Age of Onset  . Hypertension Mother   . Diabetes Paternal Aunt   . Diabetes Paternal  Uncle   . Other Father     blood clots  . Cancer Maternal Grandmother   . Heart attack Paternal Grandmother     Review of Systems  Constitutional: Negative.   HENT: Negative.   Eyes: Negative for blurred vision, double vision, photophobia, pain, discharge and redness.       Wears glasses  Respiratory:Negative for cough, hemoptysis, sputum production and wheezing.   Cardiovascular: Negative.   Gastrointestinal: Positive for heartburn. Negative for nausea, vomiting, abdominal pain, diarrhea, constipation, blood in  stool and melena.  Genitourinary: Negative.   Musculoskeletal: Positive for back pain and joint pain. Negative for myalgias, falls and neck pain.  Skin: Negative.   Neurological: Negative.   Endo/Heme/Allergies: Negative.   Psychiatric/Behavioral: Negative.   14 point review of systems was performed and is negative except as detailed under history of present illness and above   PHYSICAL EXAMINATION  ECOG PERFORMANCE STATUS: 1 - Symptomatic but completely ambulatory  Filed Vitals:   02/13/15 1107  BP: 170/97  Pulse: 84  Temp: 98.4 F (36.9 C)  Resp: 18    Physical Exam  Constitutional: She is oriented to person, place, and time and well-developed, well-nourished, and in no distress.  Obese HENT:  Head: Normocephalic and atraumatic.  Nose: Nose normal.  Mouth/Throat: Oropharynx is clear and moist. No oropharyngeal exudate.  Eyes: Conjunctivae and EOM are normal. Pupils are equal, round, and reactive to light. Right eye exhibits no discharge. Left eye exhibits no discharge. No scleral icterus.  Neck: Normal range of motion. Neck supple. No tracheal deviation present. No thyromegaly present.  Cardiovascular: Normal rate, regular rhythm and normal heart sounds.  Exam reveals no gallop and no friction rub.   No murmur heard. Pulmonary/Chest: Effort normal and breath sounds normal. She has no wheezes. She has no rales.  Abdominal: Soft. Bowel sounds are normal. Pregnancy noted Musculoskeletal: Normal range of motion. She exhibits no edema.  Lymphadenopathy:    She has no cervical adenopathy.  Neurological: She is alert and oriented to person, place, and time. She has normal reflexes. No cranial nerve deficit. Gait normal. Coordination normal.  Skin: Skin is warm and dry. No rash noted.  Psychiatric: Mood, memory, affect and judgment normal.  Nursing note and vitals reviewed.   LABORATORY DATA: I have reviewed the data as listed.  CBC    Component Value Date/Time   WBC 5.9  01/18/2015 1653   WBC 6.0 01/10/2015 1739   RBC 3.95 01/18/2015 1653   RBC 3.66* 01/10/2015 1739   HGB 11.6* 01/10/2015 1739   HCT 37.7 01/18/2015 1653   HCT 35.2* 01/10/2015 1739   PLT 214 01/18/2015 1653   PLT 184 01/10/2015 1739   MCV 95 01/18/2015 1653   MCV 96.2 01/10/2015 1739   MCH 31.6 01/18/2015 1653   MCH 31.7 01/10/2015 1739   MCHC 33.2 01/18/2015 1653   MCHC 33.0 01/10/2015 1739   RDW 14.3 01/18/2015 1653   RDW 14.2 01/10/2015 1739   LYMPHSABS 1.1 09/20/2014 0922   MONOABS 0.4 09/20/2014 0922   EOSABS 0.1 09/20/2014 0922   BASOSABS 0.0 09/20/2014 0922   CMP     Component Value Date/Time   NA 139 01/18/2015 1653   NA 138 01/10/2015 1739   K 4.7 01/18/2015 1653   CL 101 01/18/2015 1653   CO2 21 01/18/2015 1653   GLUCOSE 71 01/18/2015 1653   GLUCOSE 99 01/10/2015 1739   BUN 9 01/18/2015 1653   BUN 12 01/10/2015 1739  CREATININE 0.53* 01/18/2015 1653   CALCIUM 9.4 01/18/2015 1653   PROT 7.1 01/18/2015 1653   PROT 7.6 01/10/2015 1739   ALBUMIN 3.7 01/18/2015 1653   ALBUMIN 3.1* 01/10/2015 1739   AST 17 01/18/2015 1653   ALT 19 01/18/2015 1653   ALKPHOS 97 01/18/2015 1653   BILITOT <0.2 01/18/2015 1653   BILITOT 0.5 01/10/2015 1739   GFRNONAA 121 01/18/2015 1653   GFRAA 139 01/18/2015 1653      RADIOGRAPHIC STUDIES: EXAM: CT ANGIOGRAPHY CHEST WITH CONTRAST  IMPRESSION: Positive for acute PE with CTevidence of right heart strain (RV/LV Ratio = 1.65 ) consistent with at least submassive (intermediate risk) PE. The presence of right heart strain has been associated with an increased risk of morbidity and mortality. Critical Value/emergent results were called by telephone at the time of interpretation on 04/27/2013 at 6:19 PM to Dr. Milton Ferguson , who verbally acknowledged these results.   Electronically Signed  By: Margaree Mackintosh M.D.  On: 04/27/2013 18:20 IMPRESSION: Positive for acute PE with CTevidence of right heart strain  (RV/LV Ratio = 1.65 ) consistent with at least submassive (intermediate risk) PE. The presence of right heart strain has been associated with an increased risk of morbidity and mortality. Critical Value/emergent results were called by telephone at the time of interpretation on 04/27/2013 at 6:19 PM to Dr. Milton Ferguson , who verbally acknowledged these results.   Electronically Signed  By: Margaree Mackintosh M.D.  On: 04/27/2013 18:20    ASSESSMENT and THERAPY PLAN:  Anemia, iron deficiency Large Saddle PE Tobacco Use Multiple pregnancy losses Pregnancy,   Estimated delivery of 03/02/2015   She is currently on lovenox.  This is the first pregnancy she has been able to carry to term.  We are going to switch her to heparin. I will start her on 40000 units twice daily we will check a PTT 6 hours after her 3rd dose and adjust accordingly. She will stop heparin 24 hours prior to going in for her induction.  From Up to date:  ?Convert from subcutaneous LMW heparin to unfractionated heparin prior to delivery, and from subcutaneous unfractionated heparin to intravenous unfractionated heparin prior to anticipated delivery in those who require more continuous anticoagulation. Discontinue heparin at the onset of labor. Maximum control of anticoagulation can be achieved if the timing of delivery is planned (scheduled cesarean or induction of labor). (See 'Switch to unfractionated heparin' below.) ?If preterm labor develops in a patient receiving heparin, protamine sulfate has been used to reverse maternal heparinization. However, it is best to avoid administration of protamine antepartum unless hemorrhage cannot be controlled using routine supportive measures. (See "Heparin and LMW heparin: Dosing and adverse effects", section on 'Reversal (protamine)' and "Heparin and LMW heparin: Dosing and adverse effects", section on 'Bleeding'.) ?Place or remove a neuraxial needle or catheter only after the  patient is no longer anticoagulated   She will need to resume anticoagulation after delivery.  She will need to return to [facility] to complete her prior consultation.   She obtains her prescriptions at [facility]. If she has trouble getting her heparin filled, she knows to let us know.  I will see her back in 2-3 months. At that time, we will check her CBC and ferritin. In regards to breast feeding she understands that if she chooses to do so she will need to remain on lovenox or restart coumadin.   Orders Placed This Encounter  Procedures  . CBC with Differential    Standing Status:  Future     Number of Occurrences:      Standing Expiration Date: 02/13/2016  . Ferritin    Standing Status: Future     Number of Occurrences:      Standing Expiration Date: 02/13/2016  . APTT    Standing Status: Future     Number of Occurrences:      Standing Expiration Date: 02/13/2016   All questions were answered. The patient knows to call the clinic with any problems, questions or concerns. We can certainly see the patient much sooner if necessary.  This document serves as a record of services personally performed by [name], MD. It was created on her behalf by Toni Amend, a trained medical scribe. The creation of this record is based on the scribe's personal observations and the provider's statements to them. This document has been checked and approved by the attending provider.     I have reviewed the above documentation for accuracy and completeness and I agree with the above. Donald Pore MD

## 2015-02-14 ENCOUNTER — Encounter (HOSPITAL_COMMUNITY): Payer: Self-pay | Admitting: Advanced Practice Midwife

## 2015-02-14 ENCOUNTER — Other Ambulatory Visit: Payer: Self-pay | Admitting: Obstetrics & Gynecology

## 2015-02-14 ENCOUNTER — Ambulatory Visit (INDEPENDENT_AMBULATORY_CARE_PROVIDER_SITE_OTHER): Payer: Medicaid Other | Admitting: Advanced Practice Midwife

## 2015-02-14 ENCOUNTER — Inpatient Hospital Stay (HOSPITAL_COMMUNITY)
Admission: AD | Admit: 2015-02-14 | Discharge: 2015-02-14 | Disposition: A | Discharge status: Home / Self Care | Payer: Medicaid Other | Source: Ambulatory Visit | Attending: Obstetrics and Gynecology | Admitting: Obstetrics and Gynecology

## 2015-02-14 ENCOUNTER — Ambulatory Visit (INDEPENDENT_AMBULATORY_CARE_PROVIDER_SITE_OTHER): Payer: Medicaid Other

## 2015-02-14 VITALS — BP 218/120 | HR 82 | Wt >= 6400 oz

## 2015-02-14 DIAGNOSIS — I10 Essential (primary) hypertension: Secondary | ICD-10-CM

## 2015-02-14 DIAGNOSIS — O09893 Supervision of other high risk pregnancies, third trimester: Secondary | ICD-10-CM

## 2015-02-14 DIAGNOSIS — Z1389 Encounter for screening for other disorder: Secondary | ICD-10-CM | POA: Diagnosis not present

## 2015-02-14 DIAGNOSIS — Z331 Pregnant state, incidental: Secondary | ICD-10-CM

## 2015-02-14 DIAGNOSIS — O403XX0 Polyhydramnios, third trimester, not applicable or unspecified: Secondary | ICD-10-CM

## 2015-02-14 DIAGNOSIS — Z87891 Personal history of nicotine dependence: Secondary | ICD-10-CM | POA: Diagnosis not present

## 2015-02-14 DIAGNOSIS — Z8759 Personal history of other complications of pregnancy, childbirth and the puerperium: Secondary | ICD-10-CM

## 2015-02-14 DIAGNOSIS — O163 Unspecified maternal hypertension, third trimester: Secondary | ICD-10-CM

## 2015-02-14 DIAGNOSIS — Z3A37 37 weeks gestation of pregnancy: Secondary | ICD-10-CM | POA: Insufficient documentation

## 2015-02-14 DIAGNOSIS — O09523 Supervision of elderly multigravida, third trimester: Secondary | ICD-10-CM

## 2015-02-14 DIAGNOSIS — Z86711 Personal history of pulmonary embolism: Secondary | ICD-10-CM | POA: Insufficient documentation

## 2015-02-14 DIAGNOSIS — O10913 Unspecified pre-existing hypertension complicating pregnancy, third trimester: Secondary | ICD-10-CM

## 2015-02-14 HISTORY — DX: Acute embolism and thrombosis of unspecified deep veins of unspecified lower extremity: I82.409

## 2015-02-14 LAB — COMPREHENSIVE METABOLIC PANEL
ALK PHOS: 137 U/L — AB (ref 38–126)
ALT: 18 U/L (ref 14–54)
ANION GAP: 8 (ref 5–15)
AST: 17 U/L (ref 15–41)
Albumin: 3.3 g/dL — ABNORMAL LOW (ref 3.5–5.0)
BUN: 16 mg/dL (ref 6–20)
CALCIUM: 9.4 mg/dL (ref 8.9–10.3)
CO2: 25 mmol/L (ref 22–32)
Chloride: 105 mmol/L (ref 101–111)
Creatinine, Ser: 0.69 mg/dL (ref 0.44–1.00)
GFR calc non Af Amer: >60 mL/min (ref >60)
Glucose, Bld: 73 mg/dL (ref 65–99)
Potassium: 4 mmol/L (ref 3.5–5.1)
SODIUM: 138 mmol/L (ref 135–145)
TOTAL PROTEIN: 8 g/dL (ref 6.5–8.1)
Total Bilirubin: 0.6 mg/dL (ref 0.3–1.2)

## 2015-02-14 LAB — URINALYSIS, ROUTINE W REFLEX MICROSCOPIC
BILIRUBIN URINE: NEGATIVE
Glucose, UA: NEGATIVE mg/dL
Hgb urine dipstick: NEGATIVE
Ketones, ur: NEGATIVE mg/dL
LEUKOCYTES UA: NEGATIVE
NITRITE: NEGATIVE
Protein, ur: NEGATIVE mg/dL
SPECIFIC GRAVITY, URINE: 1.02 (ref 1.005–1.030)
pH: 5.5 (ref 5.0–8.0)

## 2015-02-14 LAB — CBC
HCT: 36.9 % (ref 36.0–46.0)
HEMOGLOBIN: 12.4 g/dL (ref 12.0–15.0)
MCH: 31.9 pg (ref 26.0–34.0)
MCHC: 33.6 g/dL (ref 30.0–36.0)
MCV: 94.9 fL (ref 78.0–100.0)
Platelets: 190 10*3/uL (ref 150–400)
RBC: 3.89 MIL/uL (ref 3.87–5.11)
RDW: 14.1 % (ref 11.5–15.5)
WBC: 6.4 10*3/uL (ref 4.0–10.5)

## 2015-02-14 LAB — POCT URINALYSIS DIPSTICK
Blood, UA: NEGATIVE
GLUCOSE UA: NEGATIVE
Ketones, UA: NEGATIVE
LEUKOCYTES UA: NEGATIVE
NITRITE UA: NEGATIVE

## 2015-02-14 LAB — PROTEIN / CREATININE RATIO, URINE: CREATININE, URINE: 50 mg/dL

## 2015-02-14 MED ORDER — LABETALOL HCL 5 MG/ML IV SOLN: 20.0000 mg | INTRAVENOUS | Status: DC | PRN

## 2015-02-14 MED ORDER — HYDRALAZINE HCL 20 MG/ML IJ SOLN: 10.0000 mg | Freq: Once | INTRAMUSCULAR | Status: DC | PRN

## 2015-02-14 MED ORDER — LABETALOL HCL 100 MG PO TABS: 600.0000 mg | ORAL_TABLET | Freq: Once | ORAL | Status: DC

## 2015-02-14 NOTE — MAU Provider Note (Signed)
Chief Complaint:  Hypertension   First Provider Initiated Contact with Patient 02/14/15 1507     HPI: [name] [name] is a 40 y.o. G4P0030 at [redacted]w[redacted]d who was sent to maternity admissions for severe-range blood pressures 120s over 110-120 at [facility]. Has history of chronic hypertension. Is on labetalol 600 mg 3 times a day and Norvasc 10 mg daily. Ran out of labetalol 02/11/2015. Had prescription refilled but it will not be available until his afternoon. Has still been taking Norvasc as directed.  Blood pressure since about the pregnancy have been 120-190/70-90's.   Associated signs and symptoms: Negative for headache, vision changes, epigastric pain, chest pain, shortness of breath or dizziness.  Denies contractions, leakage of fluid or vaginal bleeding. Good fetal movement.   Pregnancy Course: Complicated by chronic hypertension and history of PE, On Lovenox.  Past Medical History: Past Medical History  Diagnosis Date  . Closed left ankle fracture     August 30 2012  . Obesity   . Tobacco use disorder   . High blood pressure   . Pulmonary embolism ([place]) 04/2013  . OSA on CPAP   . Pregnancy     09/20/14 17 weeks  . Fibroid tumor   . DVT (deep venous thrombosis) ([place])     L leg    Past obstetric history: OB History  Gravida Para Term Preterm AB SAB TAB Ectopic Multiple Living  4    3 3         # Outcome Date GA Lbr Len/2nd Weight Sex Delivery Anes PTL Lv  4 Current           3 SAB 2014          2 SAB 2013          1 SAB 2012              Past Surgical History: Past Surgical History  Procedure Laterality Date  . Cyst removal trunk    . Abscess drainage Bilateral 01/10/15     Family History: Family History  Problem Relation Age of Onset  . Hypertension Mother   . Diabetes Paternal Aunt   . Diabetes Paternal Uncle   . Other Father     blood clots  . Cancer Maternal Grandmother   . Heart attack Paternal Grandmother     Social History: Social History   Substance Use Topics  . Smoking status: Former Smoker -- 0.25 packs/day for 18 years    Types: Cigarettes  . Smokeless tobacco: Never Used  . Alcohol Use: No    Allergies:  Allergies  Allergen Reactions  . Sulfa Antibiotics Itching, Swelling and Rash    Lips swelling    Meds:  Prescriptions prior to admission  Medication Sig Dispense Refill Last Dose  . amLODipine (NORVASC) 10 MG tablet Take 1 tablet (10 mg total) by mouth daily. 30 tablet 3 02/14/2015 at Unknown time  . enoxaparin (LOVENOX) 150 MG/ML injection Inject 1.04 mLs (155 mg total) into the skin every 12 (twelve) hours. 60 Syringe 3 02/14/2015 at Unknown time  . Iron Polysacch Cmplx-B12-FA 150-0.025-1 MG CAPS Take 1 capsule by mouth daily. (Patient taking differently: Take 1 capsule by mouth 2 (two) times daily. ) 30 each 6 02/14/2015 at Unknown time  . labetalol (NORMODYNE) 200 MG tablet Take 600 mg by mouth 3 (three) times daily.   Past Week at Unknown time  . Prenatal Vit-Fe Fumarate-FA (PRENATAL COMPLETE) 14-0.4 MG TABS Take 1 tablet by mouth daily.  100 each 3 02/14/2015 at Unknown time  . heparin 20000 UNIT/ML injection 2 mls subcutaneously twice daily (Patient not taking: Reported on 02/14/2015) 120 mL 2 Has not started  . [DISCONTINUED] labetalol (NORMODYNE) 200 MG tablet TAKE THREE TABLETS BY MOUTH TWICE DAILY (Patient not taking: Reported on 02/14/2015) 84 tablet 2 Not Taking at Unknown time    I have reviewed patient's Past Medical Hx, Surgical Hx, Family Hx, Social Hx, medications and allergies.   ROS:  Review of Systems  Constitutional: Negative for diaphoresis.  Eyes: Negative for visual disturbance.  Respiratory: Negative for shortness of breath.   Cardiovascular: Negative for chest pain.  Gastrointestinal: Negative for abdominal pain.  Neurological: Negative for headaches.    Physical Exam  Patient Vitals for the past 24 hrs:  BP Temp Temp src Pulse Resp SpO2 Height Weight  02/14/15 1533 155/91 mmHg - -  72 - - - -  02/14/15 1523 166/91 mmHg - - 76 - - - -  02/14/15 1513 159/89 mmHg - - 73 - - - -  02/14/15 1506 - - - - - - 5\' 10"  (1.778 m) (!) 400 lb (181.439 kg)  02/14/15 1503 154/85 mmHg - - 77 - - - -  02/14/15 1459 157/90 mmHg - - 84 - - - -  02/14/15 1442 190/94 mmHg 98 F (36.7 C) Oral 75 18 97 % - -   Constitutional: Well-developed, well-nourished, morbidly obese female in no acute distress.  Cardiovascular: normal rate Respiratory: normal effort GI: Abd soft, non-tender, gravid appropriate for gestational age. MS: Extremities nontender, 1+ edema, normal ROM Neurologic: Alert and oriented x 4.  GU: Pelvic: NEFG, physiologic discharge, no blood  Dilation: Fingertip Exam by:: Marlou Porch, CNM  FHT:  Baseline 140 , moderate variability, accelerations present, no decelerations Contractions: Rare, mild   Labs: Results for orders placed or performed during the hospital encounter of 02/14/15 (from the past 24 hour(s))  Urinalysis, Routine w reflex microscopic (not at [facility])     Status: None   Collection Time: 02/14/15  2:38 PM  Result Value Ref Range   Color, Urine YELLOW YELLOW   APPearance CLEAR CLEAR   Specific Gravity, Urine 1.020 1.005 - 1.030   pH 5.5 5.0 - 8.0   Glucose, UA NEGATIVE NEGATIVE mg/dL   Hgb urine dipstick NEGATIVE NEGATIVE   Bilirubin Urine NEGATIVE NEGATIVE   Ketones, ur NEGATIVE NEGATIVE mg/dL   Protein, ur NEGATIVE NEGATIVE mg/dL   Nitrite NEGATIVE NEGATIVE   Leukocytes, UA NEGATIVE NEGATIVE  Protein / creatinine ratio, urine     Status: None   Collection Time: 02/14/15  2:38 PM  Result Value Ref Range   Creatinine, Urine 50.00 mg/dL   Total Protein, Urine <6 mg/dL   Protein Creatinine Ratio        0.00 - 0.15 mg/mg[Cre]  Comprehensive metabolic panel     Status: Abnormal   Collection Time: 02/14/15  2:53 PM  Result Value Ref Range   Sodium 138 135 - 145 mmol/L   Potassium 4.0 3.5 - 5.1 mmol/L   Chloride 105 101 - 111 mmol/L   CO2 25 22 - 32  mmol/L   Glucose, Bld 73 65 - 99 mg/dL   BUN 16 6 - 20 mg/dL   Creatinine, Ser 0.69 0.44 - 1.00 mg/dL   Calcium 9.4 8.9 - 10.3 mg/dL   Total Protein 8.0 6.5 - 8.1 g/dL   Albumin 3.3 (L) 3.5 - 5.0 g/dL   AST 17 15 - 41  U/L   ALT 18 14 - 54 U/L   Alkaline Phosphatase 137 (H) 38 - 126 U/L   Total Bilirubin 0.6 0.3 - 1.2 mg/dL   GFR calc non Af Amer >60 >60 mL/min   GFR calc Af Amer >60 >60 mL/min   Anion gap 8 5 - 15  CBC     Status: None   Collection Time: 02/14/15  2:53 PM  Result Value Ref Range   WBC 6.4 4.0 - 10.5 K/uL   RBC 3.89 3.87 - 5.11 MIL/uL   Hemoglobin 12.4 12.0 - 15.0 g/dL   HCT 36.9 36.0 - 46.0 %   MCV 94.9 78.0 - 100.0 fL   MCH 31.9 26.0 - 34.0 pg   MCHC 33.6 30.0 - 36.0 g/dL   RDW 14.1 11.5 - 15.5 %   Platelets 190 150 - 400 K/uL    Imaging:  US Ob Follow Up  02/14/2015  FOLLOW UP SONOGRAM ADRAINE ISAIS is in the office for a follow up sonogram for EFW,BPP, and cord doppler. She is a 40 y.o. year old G1P0030 with Estimated Date of Delivery: 03/02/15 by early ultrasound now at  [redacted]w[redacted]d weeks gestation. Thus far the pregnancy has been complicated by AMA,CHTN,fibroids,HX of PE.Marland Kitchen GESTATION: SINGLETON PRESENTATION: cephalic FETAL ACTIVITY:          Heart rate         149 BPM          The fetus is active. AMNIOTIC FLUID: The amniotic fluid volume is  normal, 13.5 cm. PLACENTA LOCALIZATION:  posterior GRADE 3 CERVIX: Limited view ADNEXA: wnl GESTATIONAL AGE AND  BIOMETRICS: Gestational criteria: Estimated Date of Delivery: 03/02/15 by early ultrasound now at [redacted]w[redacted]d Previous Scans:9          BIPARIETAL DIAMETER           8.93 cm         36+1 weeks HEAD CIRCUMFERENCE           33.10 cm         37+5 weeks ABDOMINAL CIRCUMFERENCE           35.39 cm         39+2 weeks FEMUR LENGTH           7.46 cm         38+1 weeks                                                       AVERAGE EGA(BY THIS SCAN):  37+6 weeks                                                 ESTIMATED FETAL WEIGHT:        3653  grams, 83 % BIOPHYSCIAL PROFILE:  COMMENTS GROSS BODY MOVEMENT                 2  TONE                2  RESPIRATIONS                2  AMNIOTIC FLUID                2                                                          SCORE:  8/8 (Note: NST was not performed as part of this antepartum testing) DOPPLER FLOW STUDIES: UMBILICAL ARTERY RI RATIOS:   0.64, 0.66 ANATOMICAL SURVEY                                                                            COMMENTS CEREBRAL VENTRICLES    CHOROID PLEXUS    CEREBELLUM    CISTERNA MAGNA    NUCHAL REGION    ORBITS    NASAL BONE    NOSE/LIP    FACIAL PROFILE yes normal  4 CHAMBERED HEART yes normal  OUTFLOW TRACTS    DIAPHRAGM yes normal  STOMACH yes normal  RENAL REGION yes normal  BLADDER yes normal  CORD INSERTION yes normal  3 VESSEL CORD yes normal  SPINE    ARMS/HANDS    LEGS/FEET    GENITALIA   female     SUSPECTED ABNORMALITIES:  no QUALITY OF SCAN: Limited because of pt body habitus TECHNICIAN COMMENTS: [place] 37+5wks,cephalic,fhr 123456 bpm,bilat adnexa's wnl,post pl gr 3,afi 13.5cm,BPP 8/8,RI .64,.66,mult fibroids n/c,efw 3653g 83% A copy of this report including all images has been saved and backed up to a second source for retrieval if needed. All measures and details of the anatomical scan, placentation, fluid volume and pelvic anatomy are contained in that report. Amber Heide Guile 02/14/2015 12:36 PM   MAU Course: CBC, CMP, protein creatinine ratio, cycle blood pressures, labetalol IV for severe blood pressures.  Blood pressures improved significantly continuously (out of severe range) before labetalol was given. Labetalol held.  Discussed history, blood pressure, labs with Dr. Elly Modena. No indication for induction of labor since blood pressures not severe-range, labs normal and patient without symptoms of preeclampsia.  MDM: 40 year old female at 27 weeks  5 days gestation with chronic hypertension. Few severe-range blood pressures, but not persistent and consistent with patient's baseline blood pressures.  Assessment: 1. Chronic hypertension in pregnancy, third trimester    Plan: Discharge home in stable condition per consult with Dr. Elly Modena.  Preeclampsia precautions. Labor precautions and fetal kick counts Patient states labetalol is ready at pharmacy and she will pick it up on the way home. Follow-up Information    Follow up with [facility] On 02/17/2015.   Why:  Routine prenatal visit or sooner as needed if symptoms worsen   Contact information:   [place] [id number] [phone]  Follow up with [facility].   Why:  As needed in emergencies   Contact information:   [address] [id number] [place] [phone]        Medication List    STOP taking these medications        enoxaparin 150 MG/ML injection  Commonly known as:  LOVENOX      TAKE these medications        amLODipine 10 MG tablet  Commonly known as:  NORVASC  Take 1 tablet (10 mg total) by mouth daily.     heparin 20000 UNIT/ML injection  2 mls subcutaneously twice daily     Iron Polysacch Cmplx-B12-FA 150-0.025-1 MG Caps  Take 1 capsule by mouth daily.     labetalol 200 MG tablet  Commonly known as:  NORMODYNE  Take 600 mg by mouth 3 (three) times daily.     PRENATAL COMPLETE 14-0.4 MG Tabs  Take 1 tablet by mouth daily.        [place], CNM 02/14/2015 5:00 PM

## 2015-02-14 NOTE — MAU Note (Signed)
Pt presents to MAU from physician's office for increase in blood pressure. Pt has not been able to take her labetalol since Saturday because the pharmacy did not have authorization to refill the medication.

## 2015-02-14 NOTE — Discharge Instructions (Signed)

## 2015-02-14 NOTE — Progress Notes (Signed)
[place] 37+5wks,cephalic,fhr 123456 bpm,bilat adnexa's wnl,post pl gr 3,afi 13.5cm,BPP 8/8,RI .64,.66,mult fibroids n/c,efw 3653g 83%

## 2015-02-14 NOTE — Progress Notes (Signed)
Fetal Surveillance Testing today:  BPP   High Risk Pregnancy Diagnosis(es):   CHTN, morbid obesity  G4P0030 [redacted]w[redacted]d Estimated Date of Delivery: 03/02/15  Blood pressure 218/120, pulse 82, weight 400 lb (181.439 kg), last menstrual period 05/23/2014.  Urinalysis: Negative Filed Vitals:   02/14/15 1233 02/14/15 1237  BP: 200/100 218/120  Pulse: 82      HPI: The patient is being seen today for ongoing management of Pregnancy, CHTN. Today she reports she has not taken her BP meds in several days--she says pharmacy needed authorization (?). Saw hematologist yesterday, and was switched to heparin d/t impending delivery. Has not picked that up either. Feels "just fine."  No HA, blurred vision.    BP weight and urine results all reviewed and noted. Patient reports good fetal movement, denies any bleeding and no rupture of membranes symptoms or regular contractions.  Patient is without complaints other than noted in her HPI. All questions were answered.  All lab and sonogram results have been reviewed. Comments:  [place] 37+5wks,cephalic,fhr 123456 bpm,bilat adnexa's wnl,post pl gr 3,afi 13.5cm,BPP 8/8,RI .64,.66,mult fibroids n/c,efw 3653g 83%  Assessment:  1.  Pregnancy at [redacted]w[redacted]d,  Estimated Date of Delivery: 03/02/15 :                          2.  Severely elevated BP                        3.  Noncompliance with BP meds  Medication(s) Plans:  Was switched to Heparin 40000 units yesterday.  BP meds TBD  Treatment Plan:  Strongly advised to go IMMEDIATELY to MAU--informed that she is in danger of having seizure/stroke.  Pt wants to pick up her BP meds at the drug store and home monitor her BP to "see if it comes down."  This was specifically advised against.   Return Friday for NST/HROB, Tuesday for US/HROB. for appointment for high risk OB care  No orders of the defined types were placed in this encounter.   Orders Placed This Encounter  Procedures  . POCT urinalysis dipstick

## 2015-02-15 NOTE — Progress Notes (Signed)
Pts heparin is coming 02/16/2015, she is coming Friday morning 5 hours after shot, so that we can draw labs 6 hours after her dose was given.

## 2015-02-16 ENCOUNTER — Other Ambulatory Visit (HOSPITAL_COMMUNITY): Payer: Self-pay

## 2015-02-17 ENCOUNTER — Encounter: Payer: Self-pay | Admitting: Obstetrics & Gynecology

## 2015-02-17 ENCOUNTER — Encounter (HOSPITAL_COMMUNITY): Payer: Medicaid Other

## 2015-02-17 ENCOUNTER — Telehealth (HOSPITAL_COMMUNITY): Payer: Self-pay | Admitting: Hematology & Oncology

## 2015-02-17 ENCOUNTER — Ambulatory Visit (INDEPENDENT_AMBULATORY_CARE_PROVIDER_SITE_OTHER): Payer: Medicaid Other | Admitting: Obstetrics & Gynecology

## 2015-02-17 ENCOUNTER — Ambulatory Visit (INDEPENDENT_AMBULATORY_CARE_PROVIDER_SITE_OTHER): Payer: Medicaid Other

## 2015-02-17 ENCOUNTER — Other Ambulatory Visit: Payer: Self-pay | Admitting: Obstetrics & Gynecology

## 2015-02-17 VITALS — BP 140/80 | HR 72 | Wt 390.2 lb

## 2015-02-17 DIAGNOSIS — O09893 Supervision of other high risk pregnancies, third trimester: Secondary | ICD-10-CM | POA: Diagnosis not present

## 2015-02-17 DIAGNOSIS — Z8759 Personal history of other complications of pregnancy, childbirth and the puerperium: Secondary | ICD-10-CM

## 2015-02-17 DIAGNOSIS — Z86711 Personal history of pulmonary embolism: Secondary | ICD-10-CM | POA: Diagnosis not present

## 2015-02-17 DIAGNOSIS — O163 Unspecified maternal hypertension, third trimester: Secondary | ICD-10-CM

## 2015-02-17 DIAGNOSIS — Z1389 Encounter for screening for other disorder: Secondary | ICD-10-CM

## 2015-02-17 DIAGNOSIS — O10913 Unspecified pre-existing hypertension complicating pregnancy, third trimester: Secondary | ICD-10-CM | POA: Diagnosis not present

## 2015-02-17 DIAGNOSIS — I2699 Other pulmonary embolism without acute cor pulmonale: Secondary | ICD-10-CM

## 2015-02-17 DIAGNOSIS — I2602 Saddle embolus of pulmonary artery with acute cor pulmonale: Secondary | ICD-10-CM | POA: Diagnosis present

## 2015-02-17 DIAGNOSIS — Z3A38 38 weeks gestation of pregnancy: Secondary | ICD-10-CM

## 2015-02-17 DIAGNOSIS — D509 Iron deficiency anemia, unspecified: Secondary | ICD-10-CM | POA: Diagnosis not present

## 2015-02-17 DIAGNOSIS — Z331 Pregnant state, incidental: Secondary | ICD-10-CM

## 2015-02-17 DIAGNOSIS — O403XX0 Polyhydramnios, third trimester, not applicable or unspecified: Secondary | ICD-10-CM

## 2015-02-17 DIAGNOSIS — I10 Essential (primary) hypertension: Secondary | ICD-10-CM

## 2015-02-17 LAB — POCT URINALYSIS DIPSTICK
GLUCOSE UA: NEGATIVE
Ketones, UA: NEGATIVE
LEUKOCYTES UA: NEGATIVE
NITRITE UA: NEGATIVE
RBC UA: NEGATIVE

## 2015-02-17 LAB — APTT: APTT: 54 s — AB (ref 24–37)

## 2015-02-17 NOTE — Progress Notes (Signed)
Fetal Surveillance Testing today:  Reactive NST   High Risk Pregnancy Diagnosis(es):   Chronic hypertension, hx of PE  G4P0030 [redacted]w[redacted]d Estimated Date of Delivery: 03/02/15  Blood pressure 140/80, pulse 72, weight 390 lb 3.2 oz (176.994 kg), last menstrual period 05/23/2014.  Urinalysis: Negative   HPI: The patient is being seen today for ongoing management of as above. Today she reports no pregnancy complaints   BP weight and urine results all reviewed and noted. Patient reports good fetal movement, denies any bleeding and no rupture of membranes symptoms or regular contractions.  Fundal Height:  U+30 Fetal Heart rate:  135 Edema:  1+  Patient is without complaints other than noted in her HPI. All questions were answered.  All lab and sonogram results have been reviewed. Comments: abnormal:    Assessment:  1.  Pregnancy at [redacted]w[redacted]d,  Estimated Date of Delivery: 03/02/15 :                          2.  chtn                        3.  pe  Medication(s) Plans:  No changes  Treatment Plan:  Twice weekly testing, indcue at 39 weeks  No Follow-up on file. for appointment for high risk OB care  No orders of the defined types were placed in this encounter.   Orders Placed This Encounter  Procedures  . US Fetal BPP W/O Non Stress  . POCT urinalysis dipstick

## 2015-02-17 NOTE — Telephone Encounter (Signed)
Spoke with patient about PTT, ok where is. To continue heparin. She knows to hold heparin 24 hours prior to induction next Thursday.  Will return Monday for another PTT 5 hours after injection. Donald Pore MD

## 2015-02-17 NOTE — Progress Notes (Signed)
[place] 38+1wks,BPP 8/8,bilat adnexa's wnl,cephalic,fhr [id number] bpm,post pl gr 3,afi 12cm

## 2015-02-20 ENCOUNTER — Other Ambulatory Visit: Payer: Self-pay | Admitting: Obstetrics and Gynecology

## 2015-02-20 ENCOUNTER — Telehealth (HOSPITAL_COMMUNITY): Payer: Self-pay | Admitting: *Deleted

## 2015-02-20 ENCOUNTER — Encounter (HOSPITAL_COMMUNITY): Payer: Self-pay | Admitting: *Deleted

## 2015-02-20 DIAGNOSIS — O10913 Unspecified pre-existing hypertension complicating pregnancy, third trimester: Secondary | ICD-10-CM

## 2015-02-20 DIAGNOSIS — O09522 Supervision of elderly multigravida, second trimester: Secondary | ICD-10-CM

## 2015-02-20 NOTE — Telephone Encounter (Signed)
Preadmission screen  

## 2015-02-21 ENCOUNTER — Other Ambulatory Visit (HOSPITAL_COMMUNITY): Payer: Self-pay

## 2015-02-21 ENCOUNTER — Encounter: Payer: Self-pay | Admitting: Obstetrics & Gynecology

## 2015-02-21 ENCOUNTER — Ambulatory Visit (INDEPENDENT_AMBULATORY_CARE_PROVIDER_SITE_OTHER): Payer: Medicaid Other

## 2015-02-21 ENCOUNTER — Ambulatory Visit (INDEPENDENT_AMBULATORY_CARE_PROVIDER_SITE_OTHER): Payer: Medicaid Other | Admitting: Obstetrics & Gynecology

## 2015-02-21 VITALS — BP 140/88 | HR 72 | Wt 393.8 lb

## 2015-02-21 DIAGNOSIS — O3413 Maternal care for benign tumor of corpus uteri, third trimester: Secondary | ICD-10-CM

## 2015-02-21 DIAGNOSIS — Z331 Pregnant state, incidental: Secondary | ICD-10-CM | POA: Diagnosis not present

## 2015-02-21 DIAGNOSIS — Z1389 Encounter for screening for other disorder: Secondary | ICD-10-CM | POA: Diagnosis not present

## 2015-02-21 DIAGNOSIS — O09522 Supervision of elderly multigravida, second trimester: Secondary | ICD-10-CM | POA: Diagnosis not present

## 2015-02-21 DIAGNOSIS — O10913 Unspecified pre-existing hypertension complicating pregnancy, third trimester: Secondary | ICD-10-CM | POA: Diagnosis not present

## 2015-02-21 DIAGNOSIS — O09893 Supervision of other high risk pregnancies, third trimester: Secondary | ICD-10-CM

## 2015-02-21 DIAGNOSIS — Z3685 Encounter for antenatal screening for Streptococcus B: Secondary | ICD-10-CM

## 2015-02-21 DIAGNOSIS — I2699 Other pulmonary embolism without acute cor pulmonale: Secondary | ICD-10-CM | POA: Diagnosis not present

## 2015-02-21 DIAGNOSIS — Z1159 Encounter for screening for other viral diseases: Secondary | ICD-10-CM

## 2015-02-21 DIAGNOSIS — O403XX0 Polyhydramnios, third trimester, not applicable or unspecified: Secondary | ICD-10-CM

## 2015-02-21 DIAGNOSIS — I10 Essential (primary) hypertension: Secondary | ICD-10-CM

## 2015-02-21 DIAGNOSIS — Z3A39 39 weeks gestation of pregnancy: Secondary | ICD-10-CM | POA: Diagnosis not present

## 2015-02-21 DIAGNOSIS — Z118 Encounter for screening for other infectious and parasitic diseases: Secondary | ICD-10-CM

## 2015-02-21 LAB — POCT URINALYSIS DIPSTICK
Blood, UA: NEGATIVE
GLUCOSE UA: NEGATIVE
KETONES UA: NEGATIVE
Leukocytes, UA: NEGATIVE
NITRITE UA: NEGATIVE
Protein, UA: NEGATIVE

## 2015-02-21 NOTE — Progress Notes (Signed)
[place] 38+5wks,cephalic,BPP [id number] .[id number] pl gr 3,afi 16cm,fhr 137 bpm,mult fibroids n/c

## 2015-02-21 NOTE — Progress Notes (Signed)
Fetal Surveillance Testing today:  BPP 8/8   High Risk Pregnancy Diagnosis(es):   Chronic hypertension, history of PE, prior to pregnancy, fibroids  G4P0030 [redacted]w[redacted]d Estimated Date of Delivery: 03/02/15  Blood pressure 140/88, pulse 72, weight 393 lb 12.8 oz (178.627 kg), last menstrual period 05/23/2014.  Urinalysis: Negative   HPI: The patient is being seen today for ongoing management of as above. Today she reports no problems   BP weight and urine results all reviewed and noted. Patient reports good fetal movement, denies any bleeding and no rupture of membranes symptoms or regular contractions.  Fundal Height:  U+36 Fetal Heart rate:  140 Edema:  1+  Patient is without complaints other than noted in her HPI. All questions were answered.  All lab and sonogram results have been reviewed. Comments:    Assessment:  1.  Pregnancy at [redacted]w[redacted]d,  Estimated Date of Delivery: 03/02/15 :                          2.  Chronic Hypertension                        3.  PE  Medication(s) Plans:  No change, labetalol 600 TID, norvasc 10, heparin 40 000 units BID  Treatment Plan:  Induction 2 days  No Follow-up on file. for appointment for high risk OB care  No orders of the defined types were placed in this encounter.   Orders Placed This Encounter  Procedures  . GC/Chlamydia Probe Amp  . Strep Gp B NAA  . POCT urinalysis dipstick

## 2015-02-22 ENCOUNTER — Other Ambulatory Visit: Payer: Self-pay | Admitting: Advanced Practice Midwife

## 2015-02-23 ENCOUNTER — Encounter (HOSPITAL_COMMUNITY): Payer: Self-pay

## 2015-02-23 ENCOUNTER — Inpatient Hospital Stay (HOSPITAL_COMMUNITY)
Admission: RE | Admit: 2015-02-23 | Discharge: 2015-02-26 | DRG: 774 | Disposition: A | Discharge status: Home / Self Care | Payer: Medicaid Other | Source: Ambulatory Visit | Attending: Family Medicine | Admitting: Family Medicine

## 2015-02-23 VITALS — BP 129/70 | HR 84 | Temp 98.6°F | Resp 20 | Ht 70.0 in | Wt 378.0 lb

## 2015-02-23 DIAGNOSIS — Z87891 Personal history of nicotine dependence: Secondary | ICD-10-CM

## 2015-02-23 DIAGNOSIS — O3413 Maternal care for benign tumor of corpus uteri, third trimester: Secondary | ICD-10-CM | POA: Diagnosis present

## 2015-02-23 DIAGNOSIS — O09893 Supervision of other high risk pregnancies, third trimester: Secondary | ICD-10-CM

## 2015-02-23 DIAGNOSIS — Z6841 Body Mass Index (BMI) 40.0 and over, adult: Secondary | ICD-10-CM | POA: Diagnosis not present

## 2015-02-23 DIAGNOSIS — O99214 Obesity complicating childbirth: Secondary | ICD-10-CM | POA: Diagnosis present

## 2015-02-23 DIAGNOSIS — D259 Leiomyoma of uterus, unspecified: Secondary | ICD-10-CM | POA: Diagnosis present

## 2015-02-23 DIAGNOSIS — I10 Essential (primary) hypertension: Secondary | ICD-10-CM

## 2015-02-23 DIAGNOSIS — O1092 Unspecified pre-existing hypertension complicating childbirth: Principal | ICD-10-CM | POA: Diagnosis present

## 2015-02-23 DIAGNOSIS — Z833 Family history of diabetes mellitus: Secondary | ICD-10-CM

## 2015-02-23 DIAGNOSIS — Z3A39 39 weeks gestation of pregnancy: Secondary | ICD-10-CM | POA: Diagnosis not present

## 2015-02-23 DIAGNOSIS — Z8249 Family history of ischemic heart disease and other diseases of the circulatory system: Secondary | ICD-10-CM

## 2015-02-23 DIAGNOSIS — O403XX0 Polyhydramnios, third trimester, not applicable or unspecified: Secondary | ICD-10-CM

## 2015-02-23 DIAGNOSIS — Z86711 Personal history of pulmonary embolism: Secondary | ICD-10-CM | POA: Diagnosis not present

## 2015-02-23 LAB — CBC
HCT: 37 % (ref 36.0–46.0)
HCT: 35.9 % — ABNORMAL LOW (ref 36.0–46.0)
HEMOGLOBIN: 12.9 g/dL (ref 12.0–15.0)
Hemoglobin: 12.3 g/dL (ref 12.0–15.0)
MCH: 33 pg (ref 26.0–34.0)
MCH: 32 pg (ref 26.0–34.0)
MCHC: 34.9 g/dL (ref 30.0–36.0)
MCHC: 34.3 g/dL (ref 30.0–36.0)
MCV: 94.6 fL (ref 78.0–100.0)
MCV: 93.5 fL (ref 78.0–100.0)
PLATELETS: 175 10*3/uL (ref 150–400)
PLATELETS: 160 10*3/uL (ref 150–400)
RBC: 3.91 MIL/uL (ref 3.87–5.11)
RBC: 3.84 MIL/uL — ABNORMAL LOW (ref 3.87–5.11)
RDW: 14.2 % (ref 11.5–15.5)
RDW: 13.6 % (ref 11.5–15.5)
WBC: 4.7 10*3/uL (ref 4.0–10.5)
WBC: 5.2 10*3/uL (ref 4.0–10.5)

## 2015-02-23 LAB — GC/CHLAMYDIA PROBE AMP
CHLAMYDIA, DNA PROBE: NEGATIVE
Neisseria gonorrhoeae by PCR: NEGATIVE

## 2015-02-23 LAB — STREP GP B NAA: Strep Gp B NAA: NEGATIVE

## 2015-02-23 LAB — PREPARE RBC (CROSSMATCH)

## 2015-02-23 MED ORDER — CITRIC ACID-SODIUM CITRATE 334-500 MG/5ML PO SOLN: 30.0000 mL | ORAL | Status: DC | PRN

## 2015-02-23 MED ORDER — LABETALOL HCL 5 MG/ML IV SOLN
20.0000 mg | INTRAVENOUS | Status: DC | PRN
  Administered 2015-02-24: 20 mg via INTRAVENOUS
  Filled 2015-02-23: qty 8
  Filled 2015-02-23: qty 4

## 2015-02-23 MED ORDER — OXYCODONE-ACETAMINOPHEN 5-325 MG PO TABS: 2.0000 | ORAL_TABLET | ORAL | Status: DC | PRN

## 2015-02-23 MED ORDER — ZOLPIDEM TARTRATE 5 MG PO TABS: 5.0000 mg | ORAL_TABLET | Freq: Every evening | ORAL | Status: DC | PRN

## 2015-02-23 MED ORDER — OXYCODONE-ACETAMINOPHEN 5-325 MG PO TABS: 1.0000 | ORAL_TABLET | ORAL | Status: DC | PRN

## 2015-02-23 MED ORDER — LACTATED RINGERS IV SOLN: 500.0000 mL | INTRAVENOUS | Status: DC | PRN

## 2015-02-23 MED ORDER — LACTATED RINGERS IV SOLN
INTRAVENOUS | Status: DC
  Administered 2015-02-23 – 2015-02-24 (×4): via INTRAVENOUS

## 2015-02-23 MED ORDER — LIDOCAINE HCL (PF) 1 % IJ SOLN
30.0000 mL | INTRAMUSCULAR | Status: DC | PRN
  Filled 2015-02-23: qty 30

## 2015-02-23 MED ORDER — ONDANSETRON HCL 4 MG/2ML IJ SOLN
4.0000 mg | Freq: Four times a day (QID) | INTRAMUSCULAR | Status: DC | PRN
  Administered 2015-02-24: 4 mg via INTRAVENOUS
  Filled 2015-02-23: qty 2

## 2015-02-23 MED ORDER — OXYTOCIN 10 UNIT/ML IJ SOLN
2.5000 [IU]/h | INTRAVENOUS | Status: DC
  Filled 2015-02-23: qty 4

## 2015-02-23 MED ORDER — OXYTOCIN 10 UNIT/ML IJ SOLN
1.0000 m[IU]/min | INTRAVENOUS | Status: DC
  Administered 2015-02-23: 2 m[IU]/min via INTRAVENOUS
  Filled 2015-02-23: qty 4

## 2015-02-23 MED ORDER — FENTANYL CITRATE (PF) 100 MCG/2ML IJ SOLN
100.0000 ug | INTRAMUSCULAR | Status: DC | PRN
  Administered 2015-02-23 (×3): 100 ug via INTRAVENOUS
  Filled 2015-02-23 (×3): qty 2

## 2015-02-23 MED ORDER — AMLODIPINE BESYLATE 10 MG PO TABS
10.0000 mg | ORAL_TABLET | Freq: Every day | ORAL | Status: DC
  Administered 2015-02-24: 10 mg via ORAL
  Filled 2015-02-23 (×3): qty 1

## 2015-02-23 MED ORDER — ACETAMINOPHEN 325 MG PO TABS: 650.0000 mg | ORAL_TABLET | ORAL | Status: DC | PRN

## 2015-02-23 MED ORDER — TERBUTALINE SULFATE 1 MG/ML IJ SOLN
0.2500 mg | Freq: Once | INTRAMUSCULAR | Status: DC | PRN
  Filled 2015-02-23: qty 1

## 2015-02-23 MED ORDER — MISOPROSTOL 25 MCG QUARTER TABLET
25.0000 ug | ORAL_TABLET | ORAL | Status: DC
  Administered 2015-02-23: 25 ug via VAGINAL
  Filled 2015-02-23: qty 1
  Filled 2015-02-23: qty 0.25
  Filled 2015-02-23 (×5): qty 1

## 2015-02-23 MED ORDER — OXYTOCIN BOLUS FROM INFUSION: 500.0000 mL | INTRAVENOUS | Status: DC

## 2015-02-23 MED ORDER — LABETALOL HCL 200 MG PO TABS
600.0000 mg | ORAL_TABLET | Freq: Three times a day (TID) | ORAL | Status: DC
  Administered 2015-02-23 – 2015-02-24 (×3): 600 mg via ORAL
  Filled 2015-02-23 (×7): qty 2

## 2015-02-23 NOTE — H&P (Signed)
LABOR ADMISSION HISTORY AND PHYSICAL  [name] [name] is a 40 y.o. female G4P0030 with IUP at [redacted]w[redacted]d by LMP and 7wk [place] presenting for IOL for CHTN. She reports +FM, no contractions, No LOF, no VB, no blurry vision, headaches or peripheral edema, and RUQ pain.  She plans on breast feeding. She request OCP for birth control.  Dating: By LMP and 7wk [place] --->  Estimated Date of Delivery: 03/02/15  Sono:    @[redacted]w[redacted]d , anatomy survey is incomplete and inadequate due to fibroids and body habitus,367g  @[redacted]w[redacted]d , 2279g, 73%, appropriate interval growth    Prenatal History/Complications: CHTN on Norvasc and Labetalol Hx of saddle PE with IVC Leiomyoma  Idiopathic polyhydramnios noted on 12/19/14 which resolved on 12/15 [place] Rubella nonimmune Obesity AMA  Past Medical History: Past Medical History  Diagnosis Date  . Closed left ankle fracture     August 30 2012  . Obesity   . Tobacco use disorder   . High blood pressure   . Pulmonary embolism ([place]) 04/2013  . OSA on CPAP   . Pregnancy     09/20/14 17 weeks  . Fibroid tumor   . DVT (deep venous thrombosis) ([facility])     L leg    Past Surgical History: Past Surgical History  Procedure Laterality Date  . Cyst removal trunk    . Abscess drainage Bilateral 01/10/15    Obstetrical History: OB History    Gravida Para Term Preterm AB TAB SAB Ectopic Multiple Living   4    3  3          Social History: Social History   Social History  . Marital Status: Widowed    Spouse Name: N/A  . Number of Children: N/A  . Years of Education: N/A   Social History Main Topics  . Smoking status: Former Smoker -- 0.25 packs/day for 18 years    Types: Cigarettes    Quit date: 11/20/2014  . Smokeless tobacco: Never Used  . Alcohol Use: No  . Drug Use: No  . Sexual Activity: Yes    Birth Control/ Protection: None   Other Topics Concern  . None   Social History Narrative    Family History: Family History  Problem Relation Age of Onset  .  Hypertension Mother   . Diabetes Paternal Aunt   . Diabetes Paternal Uncle   . Other Father     blood clots  . Cancer Maternal Grandmother   . Heart attack Paternal Grandmother     Allergies: Allergies  Allergen Reactions  . Sulfa Antibiotics Itching, Swelling and Rash    Lips swelling    Prescriptions prior to admission  Medication Sig Dispense Refill Last Dose  . amLODipine (NORVASC) 10 MG tablet Take 1 tablet (10 mg total) by mouth daily. 30 tablet 3 02/23/2015 at Unknown time  . Iron Polysacch Cmplx-B12-FA 150-0.025-1 MG CAPS Take 1 capsule by mouth daily. (Patient taking differently: Take 1 capsule by mouth 2 (two) times daily. ) 30 each 6 02/23/2015 at Unknown time  . labetalol (NORMODYNE) 200 MG tablet Take 600 mg by mouth 3 (three) times daily.   02/23/2015 at 0600  . Prenatal Vit-Fe Fumarate-FA (PRENATAL COMPLETE) 14-0.4 MG TABS Take 1 tablet by mouth daily. 100 each 3 02/23/2015 at Unknown time  . heparin 20000 UNIT/ML injection 2 mls subcutaneously twice daily 120 mL 2 02/22/2015     Review of Systems   All systems reviewed and negative except as stated in HPI  BP  150/78 mmHg  Pulse 65  Temp(Src) 98 F (36.7 C) (Oral)  Resp 20  Ht 5\' 10"  (1.778 m)  Wt 173.728 kg (383 lb)  BMI 54.95 kg/m2  LMP 05/23/2014 (Exact Date) General appearance: alert and cooperative Lungs: clear to auscultation bilaterally Heart: regular rate and rhythm Abdomen: soft, non-tender; bowel sounds normal Extremities: Homans sign is negative, no sign of DVT, edema Fetal monitoringBaseline: 135 bpm, Variability: Good {> 6 bpm), Accelerations: none and Decelerations: one variable at 1033 Uterine activity: irregular  Dilation: 1.5 Effacement (%): 50 Station: -3 Exam by:: hk  Prenatal labs: ABO, Rh: --/--/A POS (02/02 [id number]) Antibody: NEG (02/02 0752) Rubella: NONIMMUNE  RPR: Non Reactive (12/01 [id number])  HBsAg: Negative (06/30 1326)  HIV: Non Reactive (12/01 [id number])  GBS: Negative (01/31 1540)  2  hr Glucola: fasting 83, 143, 99 (passed) Genetic screening  CfDNA neg Anatomy US: Anatomy survey is incomplete and inadequate due to fibroids and body habitus  Prenatal Transfer Tool  Maternal Diabetes: No Genetic Screening: Normal (CfDNA neg) Maternal Ultrasounds/Referrals: Normal; anatomy survey is incomplete and inadequate due to fibroids and body habitus; polyhydramnios noted on 12/19/14 which resolved on 12/15 US Fetal Ultrasounds or other Referrals:  None Maternal Substance Abuse:  Yes:  Type: Smoker Significant Maternal Medications:  Meds include: Other: Heparin due to Hx of PE  Significant Maternal Lab Results: Lab values include: Group B Strep negative, Other: rubella nonimmune   Results for orders placed or performed during the hospital encounter of 02/23/15 (from the past 24 hour(s))  CBC   Collection Time: 02/23/15  7:52 AM  Result Value Ref Range   WBC 4.7 4.0 - 10.5 K/uL   RBC 3.91 3.87 - 5.11 MIL/uL   Hemoglobin 12.9 12.0 - 15.0 g/dL   HCT 37.0 36.0 - 46.0 %   MCV 94.6 78.0 - 100.0 fL   MCH 33.0 26.0 - 34.0 pg   MCHC 34.9 30.0 - 36.0 g/dL   RDW 14.2 11.5 - 15.5 %   Platelets 175 150 - 400 K/uL  Type and screen   Collection Time: 02/23/15  7:52 AM  Result Value Ref Range   ABO/RH(D) A POS    Antibody Screen NEG    Sample Expiration 02/26/2015     Patient Active Problem List   Diagnosis Date Noted  . Chronic hypertension in pregnancy 02/23/2015  . Polyhydramnios in third trimester 12/14/2014  . AMA (advanced maternal age) multigravida 35+ 09/15/2014  . Hypertension affecting pregnancy 09/15/2014  . High risk multigravida 08/18/2014  . Morbid obesity ([place]) 08/18/2014  . Fibroid, uterine 08/18/2014  . Rubella non-immune status, antepartum 08/01/2014  . Supervision of other high-risk pregnancy 07/21/2014  . Benign essential hypertension 07/21/2014  . Iron deficiency anemia due to chronic blood loss 02/05/2014  . Back pain with left-sided radiculopathy  10/31/2013  . Secondary pulmonary hypertension ([place]) 07/06/2013  . Obstructive sleep apnea 07/06/2013  . PE (pulmonary embolism) 04/27/2013  . Pleuritic chest pain 04/27/2013  . SOB (shortness of breath) 04/27/2013  . Hyperglycemia 04/27/2013  . Anemia 04/27/2013  . Tobacco use disorder 04/27/2013  . Saddle embolus of pulmonary artery with acute cor pulmonale ([place]) 04/27/2013  . Pulmonary embolism ([place]) 04/21/2013    Assessment: [name] [name] is a 40 y.o. G4P0030 at [redacted]w[redacted]d here for IOL due to [facility]  #Labor: IOL with Cytotec, consider FB #Pain: Undecided; discussed options with patient  #FWB: Cat 2; one variable decel otherwise normal baseline with good variability  #ID: GBS negative  #  MOF: Breast #MOC: OCP v POPs #Circ: n/a  Smiley Houseman, MD PGY 1 Family Medicine        CNM attestation:  I have seen and examined this patient; I agree with above documentation in the Resident's note.    Estes Lehner Grissett, CNM 2:28 PM

## 2015-02-23 NOTE — Progress Notes (Signed)
   [name] [name] is a 40 y.o. G4P0030 at [redacted]w[redacted]d  admitted for induction of labor due to Hypertension.  Subjective:  Doing well with IV meds Objective: Filed Vitals:   02/23/15 2046 02/23/15 2101 02/23/15 2117 02/23/15 2118  BP: 162/80 160/87 150/88 150/88  Pulse: 57 64 67 67  Temp:      TempSrc:      Resp: 18   18  Height:      Weight:          FHT:  FHR: 120 bpm, variability: moderate,  accelerations:  Present,  decelerations:  Absent UC:   irregular, every 2-4 minutes SVE:   Dilation: 3.5 Effacement (%): 50 Station: -3 Exam by:: Mylo Red Dishmon CNM  Pitocin @ 18 mu/min  Labs: Lab Results  Component Value Date   WBC 5.2 02/23/2015   HGB 12.3 02/23/2015   HCT 35.9* 02/23/2015   MCV 93.5 02/23/2015   PLT 160 02/23/2015    Assessment / Plan: IOL, not in labor.  Occ severe rang BP.  PRN labetalol orders given  Labor: no Fetal Wellbeing:  Category I Pain Control:  Fentanyl Anticipated MOD:  NSVD  CRESENZO-DISHMAN,Amarra Sawyer 02/23/2015, 9:31 PM

## 2015-02-23 NOTE — Progress Notes (Signed)
Labor Progress Note  [name] [name] is a 40 y.o. G4P0030 at [redacted]w[redacted]d  admitted for induction of labor due to Hypertension.  S:  Patient has no questions or concerns. Patient had elevated BP to 162/85. Denies HA, visual changes, RUQ pain. Contractions are not intense per patient.   O:  BP 162/85 mmHg  Pulse 65  Temp(Src) 98.5 F (36.9 C) (Oral)  Resp 20  Ht 5\' 10"  (1.778 m)  Wt 173.728 kg (383 lb)  BMI 54.95 kg/m2  LMP 05/23/2014 (Exact Date)     FHT:  FHR: 130 bpm, variability:min-mod, 1 accel, no decels in the past 9 minutes.  UC:  2-3.28min SVE:   Dilation: 3.5 Effacement (%): 60 Station: -3 Exam by:: hk Intact Membranes  Labs: Lab Results  Component Value Date   WBC 4.7 02/23/2015   HGB 12.9 02/23/2015   HCT 37.0 02/23/2015   MCV 94.6 02/23/2015   PLT 175 02/23/2015    Assessment / Plan: 40 y.o. [id number] [redacted]w[redacted]d early labor, on pitocin  Labor: Progressing on Pitocin, will continue to increase then AROM Fetal Wellbeing:  Cat1/2, min-mod variability  Pain Control:  undecided Anticipated MOD:  NSVD  Elevated BP: per patient second dose of labetalol is due around this time. Will continue to monitor BP  Expectant management  Smiley Houseman, MD PGY 1 Family Medicine

## 2015-02-23 NOTE — Progress Notes (Addendum)
   [name] [name] is a 40 y.o. G4P0030 at [redacted]w[redacted]d  admitted for induction of labor due to Hypertension.  Subjective: Doing well with IV meds  Objective: Filed Vitals:   02/23/15 1545 02/23/15 1704 02/23/15 1900 02/23/15 1901  BP: 123/69 157/82  152/88  Pulse: 64 62  56  Temp:   98.2 F (36.8 C)   TempSrc:   Oral   Resp:   20   Height:      Weight:          FHT:  FHR: 120 bpm, variability: moderate,  accelerations:  Present,  decelerations:  Absent UC:   irregular, every 1.5-3 minutes SVE:   deferred Pitocin @ 14 mu/min  Labs: Lab Results  Component Value Date   WBC 4.7 02/23/2015   HGB 12.9 02/23/2015   HCT 37.0 02/23/2015   MCV 94.6 02/23/2015   PLT 175 02/23/2015    Assessment / Plan: IOL for CHTN, not in labor Will continue to increase pit until adequate labor Labor: no Fetal Wellbeing:  Category I Pain Control:  fentanyl Anticipated MOD:  NSVD  [name],Ashanty [name] 02/23/2015, 7:40 PM

## 2015-02-24 ENCOUNTER — Inpatient Hospital Stay (HOSPITAL_COMMUNITY): Payer: Medicaid Other | Admitting: Anesthesiology

## 2015-02-24 ENCOUNTER — Encounter (HOSPITAL_COMMUNITY): Payer: Self-pay

## 2015-02-24 DIAGNOSIS — O99214 Obesity complicating childbirth: Secondary | ICD-10-CM

## 2015-02-24 DIAGNOSIS — Z87891 Personal history of nicotine dependence: Secondary | ICD-10-CM

## 2015-02-24 DIAGNOSIS — Z86711 Personal history of pulmonary embolism: Secondary | ICD-10-CM

## 2015-02-24 DIAGNOSIS — O1092 Unspecified pre-existing hypertension complicating childbirth: Secondary | ICD-10-CM

## 2015-02-24 DIAGNOSIS — Z3A39 39 weeks gestation of pregnancy: Secondary | ICD-10-CM

## 2015-02-24 DIAGNOSIS — O3413 Maternal care for benign tumor of corpus uteri, third trimester: Secondary | ICD-10-CM

## 2015-02-24 DIAGNOSIS — D259 Leiomyoma of uterus, unspecified: Secondary | ICD-10-CM

## 2015-02-24 LAB — CBC
HCT: 35.7 % — ABNORMAL LOW (ref 36.0–46.0)
HEMATOCRIT: 39.3 % (ref 36.0–46.0)
HEMOGLOBIN: 13.7 g/dL (ref 12.0–15.0)
Hemoglobin: 12 g/dL (ref 12.0–15.0)
MCH: 33 pg (ref 26.0–34.0)
MCH: 32.3 pg (ref 26.0–34.0)
MCHC: 34.9 g/dL (ref 30.0–36.0)
MCHC: 33.6 g/dL (ref 30.0–36.0)
MCV: 94.7 fL (ref 78.0–100.0)
MCV: 96 fL (ref 78.0–100.0)
PLATELETS: 144 10*3/uL — AB (ref 150–400)
Platelets: 157 10*3/uL (ref 150–400)
RBC: 4.15 MIL/uL (ref 3.87–5.11)
RBC: 3.72 MIL/uL — AB (ref 3.87–5.11)
RDW: 14.1 % (ref 11.5–15.5)
RDW: 14.2 % (ref 11.5–15.5)
WBC: 5.8 10*3/uL (ref 4.0–10.5)
WBC: 7.8 10*3/uL (ref 4.0–10.5)

## 2015-02-24 LAB — PROTIME-INR
INR: 0.96 (ref 0.00–1.49)
Prothrombin Time: 13 seconds (ref 11.6–15.2)

## 2015-02-24 LAB — ABO/RH: ABO/RH(D): A POS

## 2015-02-24 LAB — RPR: RPR Ser Ql: NONREACTIVE

## 2015-02-24 MED ORDER — LACTATED RINGERS IV SOLN
INTRAVENOUS | Status: DC
  Administered 2015-02-24: 09:00:00 via INTRAUTERINE

## 2015-02-24 MED ORDER — FENTANYL 2.5 MCG/ML BUPIVACAINE 1/10 % EPIDURAL INFUSION (WH - ANES)
14.0000 mL/h | INTRAMUSCULAR | Status: DC | PRN
  Administered 2015-02-24: 14 mL/h via EPIDURAL
  Administered 2015-02-24: 16 mL/h via EPIDURAL
  Filled 2015-02-24 (×2): qty 125

## 2015-02-24 MED ORDER — SENNOSIDES-DOCUSATE SODIUM 8.6-50 MG PO TABS
2.0000 | ORAL_TABLET | ORAL | Status: DC
  Administered 2015-02-25 (×2): 2 via ORAL
  Filled 2015-02-24 (×2): qty 2

## 2015-02-24 MED ORDER — AMLODIPINE BESYLATE 10 MG PO TABS
10.0000 mg | ORAL_TABLET | Freq: Every day | ORAL | Status: DC
  Filled 2015-02-24: qty 1

## 2015-02-24 MED ORDER — HYDROCHLOROTHIAZIDE 25 MG PO TABS
25.0000 mg | ORAL_TABLET | Freq: Every day | ORAL | Status: DC
  Administered 2015-02-24 – 2015-02-26 (×3): 25 mg via ORAL
  Filled 2015-02-24 (×3): qty 1

## 2015-02-24 MED ORDER — DIPHENHYDRAMINE HCL 50 MG/ML IJ SOLN: 12.5000 mg | INTRAMUSCULAR | Status: DC | PRN

## 2015-02-24 MED ORDER — LIDOCAINE HCL (PF) 1 % IJ SOLN
INTRAMUSCULAR | Status: DC | PRN
  Administered 2015-02-24: 3 mL
  Administered 2015-02-24: 10 mL via EPIDURAL

## 2015-02-24 MED ORDER — MISOPROSTOL 200 MCG PO TABS
ORAL_TABLET | ORAL | Status: AC
  Administered 2015-02-24: 600 ug via RECTAL
  Filled 2015-02-24: qty 3

## 2015-02-24 MED ORDER — PRENATAL MULTIVITAMIN CH
1.0000 | ORAL_TABLET | Freq: Every day | ORAL | Status: DC
  Administered 2015-02-25 – 2015-02-26 (×2): 1 via ORAL
  Filled 2015-02-24 (×2): qty 1

## 2015-02-24 MED ORDER — WARFARIN - PHARMACIST DOSING INPATIENT: Freq: Every day | Status: DC

## 2015-02-24 MED ORDER — ZOLPIDEM TARTRATE 5 MG PO TABS: 5.0000 mg | ORAL_TABLET | Freq: Every evening | ORAL | Status: DC | PRN

## 2015-02-24 MED ORDER — ONDANSETRON HCL 4 MG/2ML IJ SOLN: 4.0000 mg | INTRAMUSCULAR | Status: DC | PRN

## 2015-02-24 MED ORDER — BENZOCAINE-MENTHOL 20-0.5 % EX AERO
1.0000 "application " | INHALATION_SPRAY | CUTANEOUS | Status: DC | PRN
  Administered 2015-02-24: 1 via TOPICAL
  Filled 2015-02-24: qty 56

## 2015-02-24 MED ORDER — PHENYLEPHRINE 40 MCG/ML (10ML) SYRINGE FOR IV PUSH (FOR BLOOD PRESSURE SUPPORT)
80.0000 ug | PREFILLED_SYRINGE | INTRAVENOUS | Status: DC | PRN
  Filled 2015-02-24: qty 20
  Filled 2015-02-24: qty 2

## 2015-02-24 MED ORDER — WARFARIN SODIUM 10 MG PO TABS
10.0000 mg | ORAL_TABLET | Freq: Once | ORAL | Status: AC
  Administered 2015-02-24: 10 mg via ORAL
  Filled 2015-02-24: qty 1

## 2015-02-24 MED ORDER — EPHEDRINE 5 MG/ML INJ
10.0000 mg | INTRAVENOUS | Status: DC | PRN
  Filled 2015-02-24: qty 2

## 2015-02-24 MED ORDER — AMLODIPINE BESYLATE 5 MG PO TABS
2.5000 mg | ORAL_TABLET | Freq: Every day | ORAL | Status: DC
  Administered 2015-02-25 – 2015-02-26 (×2): 2.5 mg via ORAL
  Filled 2015-02-24 (×2): qty 0.5

## 2015-02-24 MED ORDER — DIBUCAINE 1 % RE OINT: 1.0000 "application " | TOPICAL_OINTMENT | RECTAL | Status: DC | PRN

## 2015-02-24 MED ORDER — ACETAMINOPHEN 325 MG PO TABS
650.0000 mg | ORAL_TABLET | ORAL | Status: DC | PRN
  Administered 2015-02-24 – 2015-02-25 (×4): 650 mg via ORAL
  Filled 2015-02-24 (×4): qty 2

## 2015-02-24 MED ORDER — IBUPROFEN 600 MG PO TABS
600.0000 mg | ORAL_TABLET | Freq: Four times a day (QID) | ORAL | Status: DC
  Administered 2015-02-24: 600 mg via ORAL
  Filled 2015-02-24: qty 1

## 2015-02-24 MED ORDER — WITCH HAZEL-GLYCERIN EX PADS: 1.0000 "application " | MEDICATED_PAD | CUTANEOUS | Status: DC | PRN

## 2015-02-24 MED ORDER — LABETALOL HCL 200 MG PO TABS: 600.0000 mg | ORAL_TABLET | Freq: Three times a day (TID) | ORAL | Status: DC

## 2015-02-24 MED ORDER — LANOLIN HYDROUS EX OINT: TOPICAL_OINTMENT | CUTANEOUS | Status: DC | PRN

## 2015-02-24 MED ORDER — SIMETHICONE 80 MG PO CHEW: 80.0000 mg | CHEWABLE_TABLET | ORAL | Status: DC | PRN

## 2015-02-24 MED ORDER — DIPHENHYDRAMINE HCL 25 MG PO CAPS: 25.0000 mg | ORAL_CAPSULE | Freq: Four times a day (QID) | ORAL | Status: DC | PRN

## 2015-02-24 MED ORDER — ENOXAPARIN SODIUM 100 MG/ML ~~LOC~~ SOLN
1.0000 mg/kg | Freq: Two times a day (BID) | SUBCUTANEOUS | Status: DC
  Administered 2015-02-24 – 2015-02-26 (×4): 170 mg via SUBCUTANEOUS
  Filled 2015-02-24 (×4): qty 2

## 2015-02-24 MED ORDER — MISOPROSTOL 200 MCG PO TABS
600.0000 ug | ORAL_TABLET | Freq: Once | ORAL | Status: AC
  Administered 2015-02-24: 600 ug via RECTAL

## 2015-02-24 MED ORDER — ONDANSETRON HCL 4 MG PO TABS: 4.0000 mg | ORAL_TABLET | ORAL | Status: DC | PRN

## 2015-02-24 MED ORDER — TETANUS-DIPHTH-ACELL PERTUSSIS 5-2.5-18.5 LF-MCG/0.5 IM SUSP
0.5000 mL | Freq: Once | INTRAMUSCULAR | Status: AC
  Administered 2015-02-25: 0.5 mL via INTRAMUSCULAR
  Filled 2015-02-24: qty 0.5

## 2015-02-24 NOTE — Progress Notes (Signed)
   [name] [name] is a 40 y.o. G4P0030 at [redacted]w[redacted]d  admitted for induction of labor due to Hypertension.  Subjective: Had SROM w/ clear fluid.  Comfortable with epidural  Objective: Filed Vitals:   02/24/15 0406 02/24/15 0410 02/24/15 0413 02/24/15 0416  BP: 183/86  154/59 150/60  Pulse: 62 63 59 62  Temp:      TempSrc:      Resp:      Height:      Weight:      SpO2:  97%       Had to get IV labetalol for BP FHT:  FHR: 120 bpm, variability: moderate,  accelerations:  Abscent,  decelerations:  Present variables with contractions UC:   regular, every 1/5-2 minutes SVE:   8/90/0  IUPC placed Pitocin @ 24 mu/min  Labs: Lab Results  Component Value Date   WBC 5.8 02/24/2015   HGB 13.7 02/24/2015   HCT 39.3 02/24/2015   MCV 94.7 02/24/2015   PLT 157 02/24/2015    Assessment / Plan: Induction of labor due to [facility],  progressing well on pitocin Amnioinfusion for variables Labor: Progressing normally Fetal Wellbeing:  Category I and Category II Pain Control:  Epidural Anticipated MOD:  NSVD  CRESENZO-DISHMAN,[name] [name] 02/24/2015, 4:29 AM

## 2015-02-24 NOTE — Progress Notes (Signed)
Interim Note:  Discussed with patient about HTN meds and anticoagulation. Regarding HTN we discussed options to replace Labetalol, either enalapril or HCTZ to take along with the pre-pregnancy dose of Norvasc that we will be starting (2.5mg  from 10mg  daily). Patient chose HCTZ. Regarding anticoagulation, we discussed options between Lovenox or Coumadin since patient desires to breastfeed. Patient chose to be on Coumadin.   A/P: Chronic HTN: - Will decrease Norvasc to 2.5mg  daily (pre-pregnancy dose) starting 2/3 - will start HCTZ 25mg  daily today  Hx of PE on Anticoagulation: - patient will restart Lovenox BID 12 hours PP (11PM) for bridge - Coumadin per pharmacy starting this evening.   Smiley Houseman, MD PGY 1 Family Medicine

## 2015-02-24 NOTE — Progress Notes (Addendum)
Labor Progress Note  [name] [name] is a 40 y.o. G4P0030 at [redacted]w[redacted]d  admitted for induction of labor due to Hypertension.  S:   Patient doing well. No concerns or questions. Cervix is complete. However, patient does not have any pain or pressure (has epidural)  O:  BP 153/94 mmHg  Pulse 64  Temp(Src) 98.9 F (37.2 C) (Axillary)  Resp 20  Ht 5\' 10"  (1.778 m)  Wt 173.728 kg (383 lb)  BMI 54.95 kg/m2  SpO2 100%  LMP 05/23/2014 (Exact Date)    FHT:  FHR: 120 bpm, variability: moderate,  accelerations:  Present,  decelerations:  Present variable UC: every 2 mins  SVE:   Dilation: 10 Effacement (%): 100 Station: +1 Exam by:: Derrill Memo, CNM SROM  Pitocin  Labs: Lab Results  Component Value Date   WBC 5.8 02/24/2015   HGB 13.7 02/24/2015   HCT 39.3 02/24/2015   MCV 94.7 02/24/2015   PLT 157 02/24/2015    Assessment / Plan: 40 y.o. G4P0030 [redacted]w[redacted]d in active labor Spontaneous labor, progressing normally  Labor: Progressing normally on Pitocin; will try pushing with nursing when she has pressure (may have to decrease dose of epidural)  Fetal Wellbeing:  Cat 2; variable decel Pain Control:  Epidural; may need to decrease dose (per anesthesia) so that patient feels pressure to push  Anticipated MOD:  NSVD   Smiley Houseman, MD PGY 1 Family Medicine

## 2015-02-24 NOTE — Anesthesia Preprocedure Evaluation (Signed)
Anesthesia Evaluation  Patient identified by MRN, date of birth, ID band Patient awake    Reviewed: Allergy & Precautions, H&P , Patient's Chart, lab work & pertinent test results  Airway Mallampati: III  TM Distance: >3 FB Neck ROM: full    Dental  (+) Teeth Intact   Pulmonary sleep apnea , former smoker,    breath sounds clear to auscultation       Cardiovascular hypertension, On Medications  Rhythm:regular Rate:Normal     Neuro/Psych    GI/Hepatic   Endo/Other  Morbid obesity  Renal/GU      Musculoskeletal   Abdominal   Peds  Hematology   Anesthesia Other Findings       Reproductive/Obstetrics (+) Pregnancy                             Anesthesia Physical Anesthesia Plan  ASA: III  Anesthesia Plan: Epidural   Post-op Pain Management:    Induction:   Airway Management Planned:   Additional Equipment:   Intra-op Plan:   Post-operative Plan:   Informed Consent: I have reviewed the patients History and Physical, chart, labs and discussed the procedure including the risks, benefits and alternatives for the proposed anesthesia with the patient or authorized representative who has indicated his/her understanding and acceptance.   Dental Advisory Given  Plan Discussed with:   Anesthesia Plan Comments: (Labs checked- platelets confirmed with RN in room. Fetal heart tracing, per RN, reported to be stable enough for sitting procedure. Discussed epidural, and patient consents to the procedure:  included risk of possible headache,backache, failed block, allergic reaction, and nerve injury. This patient was asked if she had any questions or concerns before the procedure started.)        Anesthesia Quick Evaluation

## 2015-02-24 NOTE — Progress Notes (Signed)
   [name] [name] is a 40 y.o. G4P0030 at [redacted]w[redacted]d  admitted for induction of labor due to Hypertension.  Subjective:  Doing well with IV meds Objective: Filed Vitals:   02/23/15 2202 02/23/15 2244 02/23/15 2301 02/23/15 2331  BP: 104/54 149/75 141/61 142/73  Pulse: 90 56 50 55  Temp:   97.7 F (36.5 C)   TempSrc:   Oral   Resp:   18   Height:      Weight:          FHT:  FHR: 120 bpm, variability: moderate,  accelerations:  Present,  decelerations:  Absent UC:   irregular, every 2-4 minutes SVE:   4/50/-3 Pitocin @ 24 mu/min  Labs: Lab Results  Component Value Date   WBC 5.2 02/23/2015   HGB 12.3 02/23/2015   HCT 35.9* 02/23/2015   MCV 93.5 02/23/2015   PLT 160 02/23/2015    Assessment / Plan: Labor: no, small cervical change:  Fetal Wellbeing:  Category I Pain Control:  Fentanyl Anticipated MOD:  NSVD  CRESENZO-DISHMAN,Naser Schuld 02/24/2015, 12:24 AM

## 2015-02-24 NOTE — Progress Notes (Signed)
   [name] [name] is a 40 y.o. G4P0030 at [redacted]w[redacted]d  admitted for induction of labor due to Hypertension.  Subjective:  sleeping Objective: Filed Vitals:   02/24/15 0716 02/24/15 0720 02/24/15 0725 02/24/15 0730  BP: 141/81   134/70  Pulse: 55 52 51 52  Temp:      TempSrc:      Resp:      Height:      Weight:      SpO2:  99% 100% 100%      FHT:  FHR: 120 bpm, variability: moderate,  accelerations:  Present,  decelerations:  Absent UC:   MVU 185-220 SVE:   Dilation: Lip/rim Effacement (%): 100 Station: +1 Exam by:: [name] Sarna Rothermel RN  Pitocin @ 24 mu/min  Labs: Lab Results  Component Value Date   WBC 5.8 02/24/2015   HGB 13.7 02/24/2015   HCT 39.3 02/24/2015   MCV 94.7 02/24/2015   PLT 157 02/24/2015    Assessment / Plan: Induction of labor due to [facility],  progressing well on pitocin  Labor: Progressing normally Fetal Wellbeing:  Category I Pain Control:  Epidural Anticipated MOD:  NSVD  [name] [name] 02/24/2015, 7:41 AM

## 2015-02-24 NOTE — Anesthesia Procedure Notes (Signed)
Epidural Patient location during procedure: OB  Preanesthetic Checklist Completed: patient identified, site marked, surgical consent, pre-op evaluation, timeout performed, IV checked, risks and benefits discussed and monitors and equipment checked  Epidural Patient position: sitting Prep: site prepped and draped and DuraPrep Patient monitoring: continuous pulse ox and blood pressure Approach: midline Location: L2-L3 Injection technique: LOR air  Needle:  Needle type: Tuohy  Needle gauge: 17 G Needle length: 9 cm and 9 Needle insertion depth: 10 cm Catheter type: closed end flexible Catheter size: 19 Gauge Catheter at skin depth: 18 cm Test dose: negative  Assessment Events: blood not aspirated, injection not painful, no injection resistance, negative IV test and no paresthesia  Additional Notes Dosing of Epidural:  1st dose, through catheter ............................................Marland Kitchen  Xylocaine 30 mg  2nd dose, through catheter, after waiting 3 minutes........Marland KitchenXylocaine 100 mg....... No IV sx    As each dose occurred, patient was free of IV sx; and patient exhibited no evidence of SA injection.  Patient is more comfortable after epidural dosed. Please see RN's note for documentation of vital signs,and FHR which are stable.  Patient reminded not to try to ambulate with numb legs, and that an RN must be present when she attempts to get up.

## 2015-02-24 NOTE — Progress Notes (Signed)
ANTICOAGULATION CONSULT NOTE - Initial Consult  Pharmacy Consult for Coumadin Indication: H/O of PE  Allergies  Allergen Reactions  . Sulfa Antibiotics Itching, Swelling and Rash    Lips swelling    Patient Measurements: Height: 5\' 10"  (177.8 cm) Weight: (!) 378 lb (171.46 kg) IBW/kg (Calculated) : 68.5   Vital Signs: Temp: 98.3 F (36.8 C) (02/03 1800) Temp Source: Oral (02/03 1800) BP: 124/76 mmHg (02/03 1800) Pulse Rate: 66 (02/03 1800)  Labs:  Recent Labs  02/23/15 2042 02/24/15 0220 02/24/15 1134 02/24/15 1822  HGB 12.3 13.7 12.0  --   HCT 35.9* 39.3 35.7*  --   PLT 160 157 144*  --   LABPROT  --   --   --  13.0  INR  --   --   --  0.96    Estimated Creatinine Clearance: 165.1 mL/min (by C-G formula based on Cr of 0.69).   Medical History: Past Medical History  Diagnosis Date  . Closed left ankle fracture     August 30 2012  . Obesity   . Tobacco use disorder   . High blood pressure   . Pulmonary embolism ([place]) 04/2013  . OSA on CPAP   . Pregnancy     09/20/14 17 weeks  . Fibroid tumor   . DVT (deep venous thrombosis) ([facility])     L leg    Medications:  Lovenox 1mg /kg (170mg ) sq q12h to bridge until INR therapeutic  Assessment: [name] admitted on 2/2 for IOL due to HTN. Pt, now s/p SVD with epidural catheter removal ~ 1130 today. Pt has complicated h/o of saddle PE with IVC filter. Prior to pregnancy, pt was on Xarelto which was changed to Lovenox during her pregnancy. Pt was then transitioned to Heparin 40,000 units sq bid close to delivery date with last dose given 2/1 am. Hematologist note from [facility] recommends Coumadin w/ Lovenox bridge postpartum due to obesity and breastfeeding.   Goal of Therapy:  INR 2-3 Monitor platelets by anticoagulation protocol: Yes   Plan:  1. Coumadin 10mg  po x 1 tonight. 2. Daily INRs. 3. Will perform Coumadin teaching prior to discharge.  Vernie Ammons 02/24/2015,7:35 PM

## 2015-02-25 LAB — PROTIME-INR
INR: 0.96 (ref 0.00–1.49)
Prothrombin Time: 13 seconds (ref 11.6–15.2)

## 2015-02-25 LAB — CBC
HEMATOCRIT: 30.3 % — AB (ref 36.0–46.0)
Hemoglobin: 10.6 g/dL — ABNORMAL LOW (ref 12.0–15.0)
MCH: 33.1 pg (ref 26.0–34.0)
MCHC: 35 g/dL (ref 30.0–36.0)
MCV: 94.7 fL (ref 78.0–100.0)
PLATELETS: 151 10*3/uL (ref 150–400)
RBC: 3.2 MIL/uL — ABNORMAL LOW (ref 3.87–5.11)
RDW: 14.1 % (ref 11.5–15.5)
WBC: 8.5 10*3/uL (ref 4.0–10.5)

## 2015-02-25 MED ORDER — PNEUMOCOCCAL VAC POLYVALENT 25 MCG/0.5ML IJ INJ
0.5000 mL | INJECTION | INTRAMUSCULAR | Status: AC
  Administered 2015-02-25: 0.5 mL via INTRAMUSCULAR
  Filled 2015-02-25: qty 0.5

## 2015-02-25 MED ORDER — WARFARIN SODIUM 10 MG PO TABS: 10.0000 mg | ORAL_TABLET | Freq: Once | ORAL | Status: DC

## 2015-02-25 MED ORDER — PATIENT'S GUIDE TO USING COUMADIN BOOK
Freq: Once | Status: DC
  Filled 2015-02-25: qty 1

## 2015-02-25 MED ORDER — OXYCODONE-ACETAMINOPHEN 5-325 MG PO TABS
1.0000 | ORAL_TABLET | ORAL | Status: DC | PRN
  Administered 2015-02-25: 1 via ORAL
  Administered 2015-02-25 – 2015-02-26 (×2): 2 via ORAL
  Administered 2015-02-26: 1 via ORAL
  Filled 2015-02-25: qty 2
  Filled 2015-02-25: qty 1
  Filled 2015-02-25: qty 2
  Filled 2015-02-25: qty 1

## 2015-02-25 NOTE — Progress Notes (Signed)
Post Partum Day 1 Subjective: Eating, drinking, voiding, ambulating well.  +flatus and bm.  Lochia and pain wnl.  Denies dizziness, lightheadedness, or sob. No complaints.   Objective: Blood pressure 151/90, pulse 77, temperature 97.9 F (36.6 C), temperature source Oral, resp. rate 20, height 5\' 10"  (1.778 m), weight 171.46 kg (378 lb), last menstrual period 05/23/2014, SpO2 98 %, unknown if currently breastfeeding.  Physical Exam:  General: alert, cooperative and no distress Lochia: appropriate Uterine Fundus: firm Incision: n/a DVT Evaluation: No evidence of DVT seen on physical exam. Negative Homan's sign. No cords or calf tenderness. No significant calf/ankle edema.   Recent Labs  02/24/15 1134 02/25/15 0539  HGB 12.0 10.6*  HCT 35.7* 30.3*    Assessment/Plan: Plan for discharge tomorrow  Has been just bottlefeeding- but wants to breastfeed- to begin today Planning POPs for contraception Continue Lovenox   LOS: 2 days   [name] [name] 02/25/2015, 11:30 AM

## 2015-02-25 NOTE — Progress Notes (Signed)
Notified K. Shaw CNM of pt's BP 153/72 and reviewed BP med received and next time BP meds due. No new orders. (Pt asymptomatic, hx CHTN.)

## 2015-02-25 NOTE — Lactation Note (Signed)
This note was copied from the chart of [name] [name]. Lactation Consultation Note  Patient Name: [name] [name] [id number] Date: 02/25/2015  Mom is exclusively bottle feeding with formula per her report to [facility].   Maternal Data    Feeding Feeding Type: Formula Nipple Type: Slow - flow  LATCH Score/Interventions                      Lactation Tools Discussed/Used     Consult Status      Katrine Coho 02/25/2015, 5:38 PM

## 2015-02-25 NOTE — Anesthesia Postprocedure Evaluation (Signed)
Anesthesia Post Note  Patient: [name] [name]  Procedure(s) Performed: * No procedures listed *  Patient location during evaluation: Mother Baby Anesthesia Type: Epidural Level of consciousness: awake and alert Pain management: pain level controlled Vital Signs Assessment: post-procedure vital signs reviewed and stable Respiratory status: spontaneous breathing Cardiovascular status: stable Postop Assessment: no headache, no backache, no signs of nausea or vomiting and adequate PO intake Anesthetic complications: no    Last Vitals:  Filed Vitals:   02/25/15 0621 02/25/15 0900  BP: 161/63 151/90  Pulse: 69 77  Temp: 36.6 C   Resp: 20     Last Pain:  Filed Vitals:   02/25/15 0936  PainSc: Asleep                 Shalonda Sachse Hristova

## 2015-02-25 NOTE — Progress Notes (Addendum)
Spoke with [name], CNM about pt stating that she was told that she was not to take anymore Coumadin. There was still an order in for Coumadin tonight at 18:00. Clarified whether they wanted this given or not. Per [name], do not give any more Coumadin this hospital stay. Discontinued Coumadin per [name], CNM. Also discussed BP, ok per [name] since it has come down since receiving BP meds. [name] [name]

## 2015-02-25 NOTE — Progress Notes (Signed)
ANTICOAGULATION CONSULT NOTE - Follow Up Consult  Pharmacy Consult for Coumadin Indication: H/O PE  Allergies  Allergen Reactions  . Sulfa Antibiotics Itching, Swelling and Rash    Lips swelling    Patient Measurements: Height: 5\' 10"  (177.8 cm) Weight: (!) 378 lb (171.46 kg) IBW/kg (Calculated) : 68.5  Vital Signs: Temp: 97.9 F (36.6 C) (02/04 0621) Temp Source: Oral (02/04 0621) BP: 151/90 mmHg (02/04 0900) Pulse Rate: 77 (02/04 0900)  Labs:  Recent Labs  02/24/15 0220 02/24/15 1134 02/24/15 1822 02/25/15 0539  HGB 13.7 12.0  --  10.6*  HCT 39.3 35.7*  --  30.3*  PLT 157 144*  --  151  LABPROT  --   --  13.0 13.0  INR  --   --  0.96 0.96    Estimated Creatinine Clearance: 165.1 mL/min (by C-G formula based on Cr of 0.69).   Medications:  Coumadin 10 mg PO x 1 on 2/3 at 2000  Assessment: [name] admitted on 2/2 for IOL due to HTN. Pt, now s/p SVD. Pt has complicated h/o of saddle PE with IVC filter. Current therapy includes Lovenox + Coumadin bridge until INR therapeutic. One dose of 10 mg given yesterday. INR did not change. H/H stable. Will give a second dose of 10 mg tonight and reassess INR in the am.     Goal of Therapy:  INR 2-3 Monitor platelets by anticoagulation protocol: Yes   Plan:  1. Coumadin 10 mg PO x 1 today 2. INR in the am 3. Continue Lovenox 170 mg (1 mg/kg) SQ q12h 4. Coumadin teaching prior to discharge  [name] [name] 02/25/2015,11:34 AM

## 2015-02-26 LAB — PROTIME-INR
INR: 0.97 (ref 0.00–1.49)
PROTHROMBIN TIME: 13.1 s (ref 11.6–15.2)

## 2015-02-26 MED ORDER — MEASLES, MUMPS & RUBELLA VAC ~~LOC~~ INJ
0.5000 mL | INJECTION | Freq: Once | SUBCUTANEOUS | Status: AC
Start: 2015-02-26 — End: 2015-02-26
  Administered 2015-02-26: 0.5 mL via SUBCUTANEOUS
  Filled 2015-02-26: qty 0.5

## 2015-02-26 MED ORDER — ENOXAPARIN SODIUM 150 MG/ML ~~LOC~~ SOLN: 1.0000 mg/kg | Freq: Two times a day (BID) | SUBCUTANEOUS | Status: DC

## 2015-02-26 MED ORDER — HYDROCHLOROTHIAZIDE 25 MG PO TABS: 25.0000 mg | ORAL_TABLET | Freq: Every day | ORAL | Status: DC

## 2015-02-26 MED ORDER — OXYCODONE-ACETAMINOPHEN 5-325 MG PO TABS: 1.0000 | ORAL_TABLET | ORAL | Status: DC | PRN

## 2015-02-26 MED ORDER — AMLODIPINE BESYLATE 2.5 MG PO TABS: 2.5000 mg | ORAL_TABLET | Freq: Every day | ORAL | Status: DC

## 2015-02-26 NOTE — Discharge Summary (Signed)
OB Discharge Summary     Patient Name: [name] [name] DOB: 05-Apr-1975 MRN: [id number]  Date of admission: 02/23/2015 Delivering MD: Smiley Houseman   Date of discharge: 02/26/2015  Admitting diagnosis: INDUCTION Intrauterine pregnancy: [redacted]w[redacted]d     Secondary diagnosis:  Active Problems:   Chronic hypertension in pregnancy  Additional problems: H/O saddle embolus/PE, AMA     Discharge diagnosis: Term Pregnancy Delivered and CHTN                                                                                                Post partum procedures:none  Augmentation: cytotec, pitocin, arom  Complications: None  Hospital course:  Induction of Labor With Vaginal Delivery   40 y.o. yo [id number] at [redacted]w[redacted]d was admitted to the hospital 02/23/2015 for induction of labor.  Indication for induction: CHTN.  Patient had an uncomplicated labor course as follows: Membrane Rupture Time/Date: 2:08 AM ,02/24/2015   Intrapartum Procedures: Episiotomy: None [1]                                         Lacerations:  None [1]  Patient had delivery of a Viable infant.  Information for the patient's newborn:  Aaralynn, Buller [id number]  Delivery Method: Vaginal, Spontaneous Delivery (Filed from Delivery Summary)   02/24/2015  Details of delivery can be found in separate delivery note.  Patient had a routine postpartum course. Patient is discharged home 02/26/2015.   Physical exam  Filed Vitals:   02/25/15 2005 02/25/15 2032 02/26/15 0006 02/26/15 0557  BP: 160/83 148/74 138/70 129/70  Pulse: 78 76 84 84  Temp: 99 F (37.2 C) 98.7 F (37.1 C) 98 F (36.7 C) 98.6 F (37 C)  TempSrc: Oral Oral  Oral  Resp: 22 18 18 20   Height:      Weight:      SpO2: 100% 100% 97%    General: alert, cooperative and no distress Lochia: appropriate Uterine Fundus: firm Incision: N/A DVT Evaluation: No evidence of DVT seen on physical exam. Negative Homan's sign. No cords or calf tenderness. No significant  calf/ankle edema. Labs: Lab Results  Component Value Date   WBC 8.5 02/25/2015   HGB 10.6* 02/25/2015   HCT 30.3* 02/25/2015   MCV 94.7 02/25/2015   PLT 151 02/25/2015   CMP Latest Ref Rng 02/14/2015  Glucose 65 - 99 mg/dL 73  BUN 6 - 20 mg/dL 16  Creatinine 0.44 - 1.00 mg/dL 0.69  Sodium 135 - 145 mmol/L 138  Potassium 3.5 - 5.1 mmol/L 4.0  Chloride 101 - 111 mmol/L 105  CO2 22 - 32 mmol/L 25  Calcium 8.9 - 10.3 mg/dL 9.4  Total Protein 6.5 - 8.1 g/dL 8.0  Total Bilirubin 0.3 - 1.2 mg/dL 0.6  Alkaline Phos 38 - 126 U/L 137(H)  AST 15 - 41 U/L 17  ALT 14 - 54 U/L 18    Discharge instruction: per After Visit Summary and "Baby and Me Booklet".  After visit meds:  Norvasc 2.5mg  daily HCTZ 25mg  daily Lovenox 170mg  q 12hr Percocet #20 d/t low back pain, apap not helping, not able to take ibuprofen  Diet: routine diet  Activity: Advance as tolerated. Pelvic rest for 6 weeks.   Outpatient follow up:1wk for bp check Follow up Appt:Future Appointments Date Time Provider [place]  05/15/2015 12:40 PM [facility] LAB [facility] None  05/15/2015 1:00 PM Patrici Ranks, MD [facility] None   Follow up Visit:No Follow-up on file.  Postpartum contraception: plans for POPs  Newborn Data: Live born female  Birth Weight: 7 lb 14.8 oz (3595 g) APGAR: 9, 9  Baby Feeding: Bottle Disposition:home with mother   02/26/2015 Tawnya Crook, CNM

## 2015-02-26 NOTE — Discharge Instructions (Signed)
NO SEX UNTIL AFTER YOU GET YOUR BIRTH CONTROL  ° °Postpartum Care After Vaginal Delivery °After you deliver your newborn (postpartum period), the usual stay in the hospital is 24-72 hours. If there were problems with your labor or delivery, or if you have other medical problems, you might be in the hospital longer.  °While you are in the hospital, you will receive help and instructions on how to care for yourself and your newborn during the postpartum period.  °While you are in the hospital: °· Be sure to tell your nurses if you have pain or discomfort, as well as where you feel the pain and what makes the pain worse. °· If you had an incision made near your vagina (episiotomy) or if you had some tearing during delivery, the nurses may put ice packs on your episiotomy or tear. The ice packs may help to reduce the pain and swelling. °· If you are breastfeeding, you may feel uncomfortable contractions of your uterus for a couple of weeks. This is normal. The contractions help your uterus get back to normal size. °· It is normal to have some bleeding after delivery. °· For the first 1-3 days after delivery, the flow is red and the amount may be similar to a period. °· It is common for the flow to start and stop. °· In the first few days, you may pass some small clots. Let your nurses know if you begin to pass large clots or your flow increases. °· Do not  flush blood clots down the toilet before having the nurse look at them. °· During the next 3-10 days after delivery, your flow should become more watery and pink or brown-tinged in color. °· Ten to fourteen days after delivery, your flow should be a small amount of yellowish-white discharge. °· The amount of your flow will decrease over the first few weeks after delivery. Your flow may stop in 6-8 weeks. Most women have had their flow stop by 12 weeks after delivery. °· You should change your sanitary pads frequently. °· Wash your hands thoroughly with soap and water  for at least 20 seconds after changing pads, using the toilet, or before holding or feeding your newborn. °· You should feel like you need to empty your bladder within the first 6-8 hours after delivery. °· In case you become weak, lightheaded, or faint, call your nurse before you get out of bed for the first time and before you take a shower for the first time. °· Within the first few days after delivery, your breasts may begin to feel tender and full. This is called engorgement. Breast tenderness usually goes away within 48-72 hours after engorgement occurs. You may also notice milk leaking from your breasts. If you are not breastfeeding, do not stimulate your breasts. Breast stimulation can make your breasts produce more milk. °· Spending as much time as possible with your newborn is very important. During this time, you and your newborn can feel close and get to know each other. Having your newborn stay in your room (rooming in) will help to strengthen the bond with your newborn.  It will give you time to get to know your newborn and become comfortable caring for your newborn. °· Your hormones change after delivery. Sometimes the hormone changes can temporarily cause you to feel sad or tearful. These feelings should not last more than a few days. If these feelings last longer than that, you should talk to your caregiver. °· If desired,   talk to your caregiver about methods of family planning or contraception. °· Talk to your caregiver about immunizations. Your caregiver may want you to have the following immunizations before leaving the hospital: °· Tetanus, diphtheria, and pertussis (Tdap) or tetanus and diphtheria (Td) immunization. It is very important that you and your family (including grandparents) or others caring for your newborn are up-to-date with the Tdap or Td immunizations. The Tdap or Td immunization can help protect your newborn from getting ill. °· Rubella immunization. °· Varicella (chickenpox)  immunization. °· Influenza immunization. You should receive this annual immunization if you did not receive the immunization during your pregnancy. °  °This information is not intended to replace advice given to you by your health care provider. Make sure you discuss any questions you have with your health care provider. °  °Document Released: 11/04/2006 Document Revised: 10/02/2011 Document Reviewed: 09/04/2011 °Elsevier Interactive Patient Education ©2016 [employer] ° °Breastfeeding °Deciding to breastfeed is one of the best choices you can make for you and your baby. A change in hormones during pregnancy causes your breast tissue to grow and increases the number and size of your milk ducts. These hormones also allow proteins, sugars, and fats from your blood supply to make breast milk in your milk-producing glands. Hormones prevent breast milk from being released before your baby is born as well as prompt milk flow after birth. Once breastfeeding has begun, thoughts of your baby, as well as his or her sucking or crying, can stimulate the release of milk from your milk-producing glands.  °BENEFITS OF BREASTFEEDING °For Your Baby °· Your first milk (colostrum) helps your baby's digestive system function better. °· There are antibodies in your milk that help your baby fight off infections. °· Your baby has a lower incidence of asthma, allergies, and sudden infant death syndrome. °· The nutrients in breast milk are better for your baby than infant formulas and are designed uniquely for your baby's needs. °· Breast milk improves your baby's brain development. °· Your baby is less likely to develop other conditions, such as childhood obesity, asthma, or type 2 diabetes mellitus. °For You °· Breastfeeding helps to create a very special bond between you and your baby. °· Breastfeeding is convenient. Breast milk is always available at the correct temperature and costs nothing. °· Breastfeeding helps to burn calories  and helps you lose the weight gained during pregnancy. °· Breastfeeding makes your uterus contract to its prepregnancy size faster and slows bleeding (lochia) after you give birth.   °· Breastfeeding helps to lower your risk of developing type 2 diabetes mellitus, osteoporosis, and breast or ovarian cancer later in life. °SIGNS THAT YOUR BABY IS HUNGRY °Early Signs of Hunger °· Increased alertness or activity. °· Stretching. °· Movement of the head from side to side. °· Movement of the head and opening of the mouth when the corner of the mouth or cheek is stroked (rooting). °· Increased sucking sounds, smacking lips, cooing, sighing, or squeaking. °· Hand-to-mouth movements. °· Increased sucking of fingers or hands. °Late Signs of Hunger °· Fussing. °· Intermittent crying. °Extreme Signs of Hunger °Signs of extreme hunger will require calming and consoling before your baby will be able to breastfeed successfully. Do not wait for the following signs of extreme hunger to occur before you initiate breastfeeding: °· Restlessness. °· A loud, strong cry. °· Screaming. °BREASTFEEDING BASICS °Breastfeeding Initiation °· Find a comfortable place to sit or lie down, with your neck and back well supported. °· Place   a pillow or rolled up blanket under your baby to bring him or her to the level of your breast (if you are seated). Nursing pillows are specially designed to help support your arms and your baby while you breastfeed. °· Make sure that your baby's abdomen is facing your abdomen. °· Gently massage your breast. With your fingertips, massage from your chest wall toward your nipple in a circular motion. This encourages milk flow. You may need to continue this action during the feeding if your milk flows slowly. °· Support your breast with 4 fingers underneath and your thumb above your nipple. Make sure your fingers are well away from your nipple and your baby's mouth. °· Stroke your baby's lips gently with your finger or  nipple. °· When your baby's mouth is open wide enough, quickly bring your baby to your breast, placing your entire nipple and as much of the colored area around your nipple (areola) as possible into your baby's mouth. °¨ More areola should be visible above your baby's upper lip than below the lower lip. °¨ Your baby's tongue should be between his or her lower gum and your breast. °· Ensure that your baby's mouth is correctly positioned around your nipple (latched). Your baby's lips should create a seal on your breast and be turned out (everted). °· It is common for your baby to suck about 2-3 minutes in order to start the flow of breast milk. °Latching °Teaching your baby how to latch on to your breast properly is very important. An improper latch can cause nipple pain and decreased milk supply for you and poor weight gain in your baby. Also, if your baby is not latched onto your nipple properly, he or she may swallow some air during feeding. This can make your baby fussy. Burping your baby when you switch breasts during the feeding can help to get rid of the air. However, teaching your baby to latch on properly is still the best way to prevent fussiness from swallowing air while breastfeeding. °Signs that your baby has successfully latched on to your nipple: °· Silent tugging or silent sucking, without causing you pain. °· Swallowing heard between every 3-4 sucks. °· Muscle movement above and in front of his or her ears while sucking. °Signs that your baby has not successfully latched on to nipple: °· Sucking sounds or smacking sounds from your baby while breastfeeding. °· Nipple pain. °If you think your baby has not latched on correctly, slip your finger into the corner of your baby's mouth to break the suction and place it between your baby's gums. Attempt breastfeeding initiation again. °Signs of Successful Breastfeeding °Signs from your baby: °· A gradual decrease in the number of sucks or complete cessation of  sucking. °· Falling asleep. °· Relaxation of his or her body. °· Retention of a small amount of milk in his or her mouth. °· Letting go of your breast by himself or herself. °Signs from you: °· Breasts that have increased in firmness, weight, and size 1-3 hours after feeding. °· Breasts that are softer immediately after breastfeeding. °· Increased milk volume, as well as a change in milk consistency and color by the fifth day of breastfeeding. °· Nipples that are not sore, cracked, or bleeding. °Signs That Your Baby is Getting Enough Milk °· Wetting at least 3 diapers in a 24-hour period. The urine should be clear and pale yellow by age 5 days. °· At least 3 stools in a 24-hour period by age   5 days. The stool should be soft and yellow. °· At least 3 stools in a 24-hour period by age 7 days. The stool should be seedy and yellow. °· No loss of weight greater than 10% of birth weight during the first 3 days of age. °· Average weight gain of 4-7 ounces (113-198 g) per week after age 4 days. °· Consistent daily weight gain by age 5 days, without weight loss after the age of 2 weeks. °After a feeding, your baby may spit up a small amount. This is common. °BREASTFEEDING FREQUENCY AND DURATION °Frequent feeding will help you make more milk and can prevent sore nipples and breast engorgement. Breastfeed when you feel the need to reduce the fullness of your breasts or when your baby shows signs of hunger. This is called "breastfeeding on demand." Avoid introducing a pacifier to your baby while you are working to establish breastfeeding (the first 4-6 weeks after your baby is born). After this time you may choose to use a pacifier. Research has shown that pacifier use during the first year of a baby's life decreases the risk of sudden infant death syndrome (SIDS). °Allow your baby to feed on each breast as long as he or she wants. Breastfeed until your baby is finished feeding. When your baby unlatches or falls asleep while  feeding from the first breast, offer the second breast. Because newborns are often sleepy in the first few weeks of life, you may need to awaken your baby to get him or her to feed. °Breastfeeding times will vary from baby to baby. However, the following rules can serve as a guide to help you ensure that your baby is properly fed: °· Newborns (babies 4 weeks of age or younger) may breastfeed every 1-3 hours. °· Newborns should not go longer than 3 hours during the day or 5 hours during the night without breastfeeding. °· You should breastfeed your baby a minimum of 8 times in a 24-hour period until you begin to introduce solid foods to your baby at around 6 months of age. °BREAST MILK PUMPING °Pumping and storing breast milk allows you to ensure that your baby is exclusively fed your breast milk, even at times when you are unable to breastfeed. This is especially important if you are going back to work while you are still breastfeeding or when you are not able to be present during feedings. Your lactation consultant can give you guidelines on how long it is safe to store breast milk. °A breast pump is a machine that allows you to pump milk from your breast into a sterile bottle. The pumped breast milk can then be stored in a refrigerator or freezer. Some breast pumps are operated by hand, while others use electricity. Ask your lactation consultant which type will work best for you. Breast pumps can be purchased, but some hospitals and breastfeeding support groups lease breast pumps on a monthly basis. A lactation consultant can teach you how to hand express breast milk, if you prefer not to use a pump. °CARING FOR YOUR BREASTS WHILE YOU BREASTFEED °Nipples can become dry, cracked, and sore while breastfeeding. The following recommendations can help keep your breasts moisturized and healthy: °· Avoid using soap on your nipples. °· Wear a supportive bra. Although not required, special nursing bras and tank tops are  designed to allow access to your breasts for breastfeeding without taking off your entire bra or top. Avoid wearing underwire-style bras or extremely tight bras. °· Air dry   your nipples for 3-4 minutes after each feeding. °· Use only cotton bra pads to absorb leaked breast milk. Leaking of breast milk between feedings is normal. °· Use lanolin on your nipples after breastfeeding. Lanolin helps to maintain your skin's normal moisture barrier. If you use pure lanolin, you do not need to wash it off before feeding your baby again. Pure lanolin is not toxic to your baby. You may also hand express a few drops of breast milk and gently massage that milk into your nipples and allow the milk to air dry. °In the first few weeks after giving birth, some women experience extremely full breasts (engorgement). Engorgement can make your breasts feel heavy, warm, and tender to the touch. Engorgement peaks within 3-5 days after you give birth. The following recommendations can help ease engorgement: °· Completely empty your breasts while breastfeeding or pumping. You may want to start by applying warm, moist heat (in the shower or with warm water-soaked hand towels) just before feeding or pumping. This increases circulation and helps the milk flow. If your baby does not completely empty your breasts while breastfeeding, pump any extra milk after he or she is finished. °· Wear a snug bra (nursing or regular) or tank top for 1-2 days to signal your body to slightly decrease milk production. °· Apply ice packs to your breasts, unless this is too uncomfortable for you. °· Make sure that your baby is latched on and positioned properly while breastfeeding. °If engorgement persists after 48 hours of following these recommendations, contact your health care provider or a lactation consultant. °OVERALL HEALTH CARE RECOMMENDATIONS WHILE BREASTFEEDING °· Eat healthy foods. Alternate between meals and snacks, eating 3 of each per day. Because  what you eat affects your breast milk, some of the foods may make your baby more irritable than usual. Avoid eating these foods if you are sure that they are negatively affecting your baby. °· Drink milk, fruit juice, and water to satisfy your thirst (about 10 glasses a day). °· Rest often, relax, and continue to take your prenatal vitamins to prevent fatigue, stress, and anemia. °· Continue breast self-awareness checks. °· Avoid chewing and smoking tobacco. Chemicals from cigarettes that pass into breast milk and exposure to secondhand smoke may harm your baby. °· Avoid alcohol and drug use, including marijuana. °Some medicines that may be harmful to your baby can pass through breast milk. It is important to ask your health care provider before taking any medicine, including all over-the-counter and prescription medicine as well as vitamin and herbal supplements. °It is possible to become pregnant while breastfeeding. If birth control is desired, ask your health care provider about options that will be safe for your baby. °SEEK MEDICAL CARE IF: °· You feel like you want to stop breastfeeding or have become frustrated with breastfeeding. °· You have painful breasts or nipples. °· Your nipples are cracked or bleeding. °· Your breasts are red, tender, or warm. °· You have a swollen area on either breast. °· You have a fever or chills. °· You have nausea or vomiting. °· You have drainage other than breast milk from your nipples. °· Your breasts do not become full before feedings by the fifth day after you give birth. °· You feel sad and depressed. °· Your baby is too sleepy to eat well. °· Your baby is having trouble sleeping.   °· Your baby is wetting less than 3 diapers in a 24-hour period. °· Your baby has less than 3 stools in   a 24-hour period. °· Your baby's skin or the white part of his or her eyes becomes yellow.   °· Your baby is not gaining weight by 5 days of age. °SEEK IMMEDIATE MEDICAL CARE IF: °· Your baby  is overly tired (lethargic) and does not want to wake up and feed. °· Your baby develops an unexplained fever. °  °This information is not intended to replace advice given to you by your health care provider. Make sure you discuss any questions you have with your health care provider. °  °Document Released: 01/07/2005 Document Revised: 09/28/2014 Document Reviewed: 07/01/2012 °Elsevier Interactive Patient Education ©2016 [employer] ° ° °

## 2015-02-27 LAB — TYPE AND SCREEN
ABO/RH(D): A POS
Antibody Screen: NEGATIVE
UNIT DIVISION: 0
Unit division: 0

## 2015-02-28 NOTE — Progress Notes (Signed)
Post discharge chart review completed.  

## 2015-03-02 ENCOUNTER — Encounter: Payer: Self-pay | Admitting: Obstetrics & Gynecology

## 2015-03-02 ENCOUNTER — Ambulatory Visit (INDEPENDENT_AMBULATORY_CARE_PROVIDER_SITE_OTHER): Payer: Medicaid Other | Admitting: Obstetrics & Gynecology

## 2015-03-02 VITALS — BP 160/100 | HR 76 | Wt 364.8 lb

## 2015-03-02 DIAGNOSIS — O1003 Pre-existing essential hypertension complicating the puerperium: Secondary | ICD-10-CM | POA: Diagnosis not present

## 2015-03-02 MED ORDER — AMLODIPINE BESYLATE 5 MG PO TABS: 2.5000 mg | ORAL_TABLET | Freq: Every day | ORAL | Status: DC

## 2015-03-02 MED ORDER — TRIAMTERENE-HCTZ 37.5-25 MG PO TABS: 1.0000 | ORAL_TABLET | Freq: Every day | ORAL | Status: DC

## 2015-03-02 NOTE — Progress Notes (Signed)
Patient ID: [name] [name], female   DOB: 1975-07-25, 40 y.o.   MRN: [id number] Blood pressure 160/100, pulse 76, weight 364 lb 12.8 oz (165.472 kg), unknown if currently breastfeeding.  Pt feels good  BP a bit high  Will increase norvasc  Meds ordered this encounter  Medications  . amLODipine (NORVASC) 5 MG tablet    Sig: Take 0.5 tablets (2.5 mg total) by mouth daily.    Dispense:  30 tablet    Refill:  11  . triamterene-hydrochlorothiazide (MAXZIDE-25) 37.5-25 MG tablet    Sig: Take 1 tablet by mouth daily.    Dispense:  30 tablet    Refill:  11   Return in about 1 week (around 03/09/2015) for Follow up, with Dr Elonda Husky.

## 2015-03-06 ENCOUNTER — Telehealth (HOSPITAL_COMMUNITY): Payer: Self-pay | Admitting: *Deleted

## 2015-03-06 ENCOUNTER — Other Ambulatory Visit (HOSPITAL_COMMUNITY): Payer: Self-pay | Admitting: Oncology

## 2015-03-06 MED ORDER — OXYCODONE-ACETAMINOPHEN 5-325 MG PO TABS: 1.0000 | ORAL_TABLET | ORAL | Status: DC | PRN

## 2015-03-06 NOTE — Telephone Encounter (Signed)
Pt contacted and notified of Rx for pain medication ready for pick up.

## 2015-03-06 NOTE — Telephone Encounter (Signed)
Yes, Ready for pick-up  Robynn Pane, PA-C 03/06/2015 12:36 PM

## 2015-03-07 ENCOUNTER — Encounter: Payer: Medicaid Other | Admitting: Obstetrics & Gynecology

## 2015-03-09 ENCOUNTER — Ambulatory Visit (INDEPENDENT_AMBULATORY_CARE_PROVIDER_SITE_OTHER): Payer: Medicaid Other | Admitting: Obstetrics & Gynecology

## 2015-03-09 ENCOUNTER — Encounter: Payer: Self-pay | Admitting: Obstetrics & Gynecology

## 2015-03-09 VITALS — BP 130/90 | HR 88 | Wt 349.4 lb

## 2015-03-09 DIAGNOSIS — O1002 Pre-existing essential hypertension complicating childbirth: Secondary | ICD-10-CM

## 2015-03-09 DIAGNOSIS — O1003 Pre-existing essential hypertension complicating the puerperium: Secondary | ICD-10-CM

## 2015-03-09 MED ORDER — NORETHINDRONE 0.35 MG PO TABS
1.0000 | ORAL_TABLET | Freq: Every day | ORAL | Status: DC
Start: 2015-03-09 — End: 2015-08-30

## 2015-03-09 NOTE — Progress Notes (Signed)
Patient ID: [name] [name], female   DOB: Jul 18, 1975, 40 y.o.   MRN: [id number] Blood pressure 130/90, pulse 88, weight 349 lb 6.4 oz (158.487 kg), unknown if currently breastfeeding.  BP ok continue current therapy   No complaints or problems   Return in about 4 weeks (around 04/06/2015) for post partum visit.   Face to face time:  10 minutes  Greater than 50% of the visit time was spent in counseling and coordination of care with the patient.  The summary and outline of the counseling and care coordination is summarized in the note above.   All questions were answered.

## 2015-03-23 NOTE — Progress Notes (Signed)
Fetal Surveillance Testing today:  Reactive NST   High Risk Pregnancy Diagnosis(es):   Chronic hypertension  [id number] [redacted]w[redacted]d Estimated Date of Delivery: 03/02/15  Blood pressure 130/80, pulse 100, weight 391 lb (177.356 kg), last menstrual period 05/23/2014, unknown if currently breastfeeding.  Urinalysis: Negative   HPI: The patient is being seen today for ongoing management of as above. Today she reports no complaints   BP weight and urine results all reviewed and noted. Patient reports good fetal movement, denies any bleeding and no rupture of membranes symptoms or regular contractions.  Fundal Height:  U+28 Fetal Heart rate:  140 Edema:  2+  Patient is without complaints other than noted in her HPI. All questions were answered.  All lab and sonogram results have been reviewed. Comments:    Assessment:  1.  Pregnancy at [redacted]w[redacted]d,  Estimated Date of Delivery: 03/02/15 :                          2.  Chronic Hypertension                        3.  History PE  Medication(s) Plans:  No changes  Treatment Plan:  Twice weeklu surveillance  Return in about 4 days (around 02/14/2015) for HROB, BPP/sono. for appointment for high risk OB care  No orders of the defined types were placed in this encounter.   Orders Placed This Encounter  Procedures  . US Fetal BPP W/O Non Stress  . [place] UA Cord Doppler  . POCT urinalysis dipstick

## 2015-03-28 ENCOUNTER — Other Ambulatory Visit (HOSPITAL_COMMUNITY): Payer: Self-pay | Admitting: Oncology

## 2015-03-30 ENCOUNTER — Other Ambulatory Visit (HOSPITAL_COMMUNITY): Payer: Self-pay | Admitting: Oncology

## 2015-03-30 DIAGNOSIS — I2602 Saddle embolus of pulmonary artery with acute cor pulmonale: Secondary | ICD-10-CM

## 2015-03-30 MED ORDER — OXYCODONE HCL 10 MG PO TABS: 10.0000 mg | ORAL_TABLET | Freq: Four times a day (QID) | ORAL | Status: DC | PRN

## 2015-04-03 ENCOUNTER — Emergency Department (HOSPITAL_COMMUNITY): Payer: Medicaid Other

## 2015-04-03 ENCOUNTER — Emergency Department (HOSPITAL_COMMUNITY)
Admission: EM | Admit: 2015-04-03 | Discharge: 2015-04-03 | Disposition: A | Discharge status: Home / Self Care | Payer: Medicaid Other | Attending: Emergency Medicine | Admitting: Emergency Medicine

## 2015-04-03 ENCOUNTER — Encounter (HOSPITAL_COMMUNITY): Payer: Self-pay | Admitting: Emergency Medicine

## 2015-04-03 DIAGNOSIS — I10 Essential (primary) hypertension: Secondary | ICD-10-CM | POA: Insufficient documentation

## 2015-04-03 DIAGNOSIS — Z87891 Personal history of nicotine dependence: Secondary | ICD-10-CM | POA: Diagnosis not present

## 2015-04-03 DIAGNOSIS — N61 Mastitis without abscess: Secondary | ICD-10-CM

## 2015-04-03 DIAGNOSIS — M79662 Pain in left lower leg: Secondary | ICD-10-CM | POA: Insufficient documentation

## 2015-04-03 DIAGNOSIS — E669 Obesity, unspecified: Secondary | ICD-10-CM | POA: Insufficient documentation

## 2015-04-03 DIAGNOSIS — Z79899 Other long term (current) drug therapy: Secondary | ICD-10-CM | POA: Diagnosis not present

## 2015-04-03 DIAGNOSIS — Z7901 Long term (current) use of anticoagulants: Secondary | ICD-10-CM | POA: Insufficient documentation

## 2015-04-03 DIAGNOSIS — M79605 Pain in left leg: Secondary | ICD-10-CM

## 2015-04-03 LAB — COMPREHENSIVE METABOLIC PANEL
ALBUMIN: 3.3 g/dL — AB (ref 3.5–5.0)
ALK PHOS: 57 U/L (ref 38–126)
ALT: 11 U/L — AB (ref 14–54)
ANION GAP: 6 (ref 5–15)
AST: 13 U/L — AB (ref 15–41)
BUN: 19 mg/dL (ref 6–20)
CALCIUM: 8.9 mg/dL (ref 8.9–10.3)
CO2: 29 mmol/L (ref 22–32)
Chloride: 102 mmol/L (ref 101–111)
Creatinine, Ser: 1.06 mg/dL — ABNORMAL HIGH (ref 0.44–1.00)
GFR calc Af Amer: >60 mL/min (ref >60)
GFR calc non Af Amer: >60 mL/min (ref >60)
GLUCOSE: 94 mg/dL (ref 65–99)
POTASSIUM: 4.3 mmol/L (ref 3.5–5.1)
SODIUM: 137 mmol/L (ref 135–145)
Total Bilirubin: 0.5 mg/dL (ref 0.3–1.2)
Total Protein: 8.3 g/dL — ABNORMAL HIGH (ref 6.5–8.1)

## 2015-04-03 LAB — CBC WITH DIFFERENTIAL/PLATELET
BASOS ABS: 0 10*3/uL (ref 0.0–0.1)
BASOS PCT: 0 %
EOS ABS: 0.1 10*3/uL (ref 0.0–0.7)
Eosinophils Relative: 1 %
HCT: 38.8 % (ref 36.0–46.0)
HEMOGLOBIN: 12.5 g/dL (ref 12.0–15.0)
LYMPHS ABS: 1.4 10*3/uL (ref 0.7–4.0)
Lymphocytes Relative: 14 %
MCH: 30.7 pg (ref 26.0–34.0)
MCHC: 32.2 g/dL (ref 30.0–36.0)
MCV: 95.3 fL (ref 78.0–100.0)
Monocytes Absolute: 0.5 10*3/uL (ref 0.1–1.0)
Monocytes Relative: 5 %
NEUTROS PCT: 80 %
Neutro Abs: 7.9 10*3/uL — ABNORMAL HIGH (ref 1.7–7.7)
Platelets: 240 10*3/uL (ref 150–400)
RBC: 4.07 MIL/uL (ref 3.87–5.11)
RDW: 13.8 % (ref 11.5–15.5)
WBC: 9.9 10*3/uL (ref 4.0–10.5)

## 2015-04-03 LAB — PROTIME-INR
INR: 0.94 (ref 0.00–1.49)
Prothrombin Time: 12.8 seconds (ref 11.6–15.2)

## 2015-04-03 MED ORDER — CLINDAMYCIN HCL 150 MG PO CAPS: 450.0000 mg | ORAL_CAPSULE | Freq: Three times a day (TID) | ORAL | Status: DC

## 2015-04-03 MED ORDER — TRAMADOL HCL 50 MG PO TABS: 50.0000 mg | ORAL_TABLET | Freq: Four times a day (QID) | ORAL | Status: DC | PRN

## 2015-04-03 MED ORDER — NAPROXEN 500 MG PO TABS: 500.0000 mg | ORAL_TABLET | Freq: Two times a day (BID) | ORAL | Status: DC

## 2015-04-03 NOTE — ED Provider Notes (Addendum)
CSN: [id number]     Arrival date & time 04/03/15  1251 History   First MD Initiated Contact with Patient 04/03/15 1637     Chief Complaint  Patient presents with  . Leg Pain     (Consider location/radiation/quality/duration/timing/severity/associated sxs/prior Treatment) The history is provided by the patient.   40 year old female with complaint of left leg pain in the calf for 3 days. Patient with a history of deep vein thrombosis and pulmonary embolus. Patient with recent delivery February 3. Was on Lovenox prior to delivery was switched to heparin and now back on Lovenox. Patient is concerned about a recurrent clot in her left leg. No injury to the leg. Patient is not breast-feeding.  Past Medical History  Diagnosis Date  . Closed left ankle fracture     August 30 2012  . Obesity   . Tobacco use disorder   . High blood pressure   . Pulmonary embolism ([facility]) 04/2013  . OSA on CPAP   . Pregnancy     09/20/14 17 weeks  . Fibroid tumor   . DVT (deep venous thrombosis) ([facility])     L leg   Past Surgical History  Procedure Laterality Date  . Cyst removal trunk    . Abscess drainage Bilateral 01/10/15  . Ivc filter placement ([facility] hx)     Family History  Problem Relation Age of Onset  . Hypertension Mother   . Diabetes Paternal Aunt   . Diabetes Paternal Uncle   . Other Father     blood clots  . Cancer Maternal Grandmother   . Heart attack Paternal Grandmother    Social History  Substance Use Topics  . Smoking status: Former Smoker -- 0.25 packs/day for 18 years    Types: Cigarettes    Quit date: 11/20/2014  . Smokeless tobacco: Never Used  . Alcohol Use: No   OB History    Gravida Para Term Preterm AB TAB SAB Ectopic Multiple Living   4 1 1  3  3   0 1     Review of Systems  Constitutional: Negative for fever.  HENT: Negative for congestion.   Eyes: Negative for redness.  Respiratory: Negative for shortness of breath.   Cardiovascular: Negative for chest pain.   Gastrointestinal: Negative for abdominal pain.  Genitourinary: Negative for dysuria.  Musculoskeletal: Negative for back pain.  Skin: Negative for rash.  Neurological: Negative for headaches.  Hematological: Bruises/bleeds easily.  Psychiatric/Behavioral: Negative for confusion.      Allergies  Sulfa antibiotics  Home Medications   Prior to Admission medications   Medication Sig Start Date End Date Taking? Authorizing Provider  amLODipine (NORVASC) 5 MG tablet Take 0.5 tablets (2.5 mg total) by mouth daily. Patient taking differently: Take 5 mg by mouth daily.  03/02/15  Yes Florian Buff, MD  enoxaparin (LOVENOX) 100 MG/ML injection Inject 170 mg into the skin every 12 (twelve) hours.   Yes Historical Provider, MD  norethindrone (MICRONOR,CAMILA,ERRIN) 0.35 MG tablet Take 1 tablet (0.35 mg total) by mouth daily. Take 1 a day 03/09/15  Yes Florian Buff, MD  Oxycodone HCl 10 MG TABS Take 1 tablet (10 mg total) by mouth every 6 (six) hours as needed. Patient taking differently: Take 10 mg by mouth every 6 (six) hours as needed (pain).  03/30/15  Yes Manon Hilding Kefalas, PA-C  triamterene-hydrochlorothiazide (MAXZIDE-25) 37.5-25 MG tablet Take 1 tablet by mouth daily. 03/02/15  Yes Florian Buff, MD  naproxen (NAPROSYN) 500 MG tablet  Take 1 tablet (500 mg total) by mouth 2 (two) times daily. 04/03/15   Fredia Sorrow, MD  traMADol (ULTRAM) 50 MG tablet Take 1 tablet (50 mg total) by mouth every 6 (six) hours as needed. 04/03/15   Fredia Sorrow, MD   BP 141/76 mmHg  Pulse 85  Temp(Src) 98.6 F (37 C) (Oral)  Resp 18  Ht 5\' 10"  (1.778 m)  Wt 155.13 kg  BMI 49.07 kg/m2  SpO2 100%  LMP 03/30/2015 Physical Exam  Constitutional: She is oriented to person, place, and time. She appears well-developed and well-nourished. No distress.  HENT:  Head: Normocephalic and atraumatic.  Mouth/Throat: Oropharynx is clear and moist.  Eyes: Conjunctivae and EOM are normal. Pupils are equal, round, and  reactive to light.  Neck: Normal range of motion. Neck supple.  Cardiovascular: Normal rate, regular rhythm and normal heart sounds.   Pulmonary/Chest: Effort normal and breath sounds normal. No respiratory distress.  Abdominal: Soft. Bowel sounds are normal. There is no tenderness.  Musculoskeletal: Normal range of motion. She exhibits no edema or tenderness.  Both legs bilaterally are the same. No calf tenderness. Dorsalis pedis pulse is 2+. Sensation intact. No swelling at the ankles.  Neurological: She is alert and oriented to person, place, and time. No cranial nerve deficit. She exhibits normal muscle tone. Coordination normal.  Skin: Skin is warm.  Nursing note and vitals reviewed.   ED Course  Procedures (including critical care time) Labs Review Labs Reviewed  CBC WITH DIFFERENTIAL/PLATELET - Abnormal; Notable for the following:    Neutro Abs 7.9 (*)    All other components within normal limits  COMPREHENSIVE METABOLIC PANEL - Abnormal; Notable for the following:    Creatinine, Ser 1.06 (*)    Total Protein 8.3 (*)    Albumin 3.3 (*)    AST 13 (*)    ALT 11 (*)    All other components within normal limits  PROTIME-INR   Results for orders placed or performed during the hospital encounter of 04/03/15  Protime-INR  Result Value Ref Range   Prothrombin Time 12.8 11.6 - 15.2 seconds   INR 0.94 0.00 - 1.49  CBC with Differential  Result Value Ref Range   WBC 9.9 4.0 - 10.5 K/uL   RBC 4.07 3.87 - 5.11 MIL/uL   Hemoglobin 12.5 12.0 - 15.0 g/dL   HCT 38.8 36.0 - 46.0 %   MCV 95.3 78.0 - 100.0 fL   MCH 30.7 26.0 - 34.0 pg   MCHC 32.2 30.0 - 36.0 g/dL   RDW 13.8 11.5 - 15.5 %   Platelets 240 150 - 400 K/uL   Neutrophils Relative % 80 %   Neutro Abs 7.9 (H) 1.7 - 7.7 K/uL   Lymphocytes Relative 14 %   Lymphs Abs 1.4 0.7 - 4.0 K/uL   Monocytes Relative 5 %   Monocytes Absolute 0.5 0.1 - 1.0 K/uL   Eosinophils Relative 1 %   Eosinophils Absolute 0.1 0.0 - 0.7 K/uL    Basophils Relative 0 %   Basophils Absolute 0.0 0.0 - 0.1 K/uL  Comprehensive metabolic panel  Result Value Ref Range   Sodium 137 135 - 145 mmol/L   Potassium 4.3 3.5 - 5.1 mmol/L   Chloride 102 101 - 111 mmol/L   CO2 29 22 - 32 mmol/L   Glucose, Bld 94 65 - 99 mg/dL   BUN 19 6 - 20 mg/dL   Creatinine, Ser 1.06 (H) 0.44 - 1.00 mg/dL   Calcium  8.9 8.9 - 10.3 mg/dL   Total Protein 8.3 (H) 6.5 - 8.1 g/dL   Albumin 3.3 (L) 3.5 - 5.0 g/dL   AST 13 (L) 15 - 41 U/L   ALT 11 (L) 14 - 54 U/L   Alkaline Phosphatase 57 38 - 126 U/L   Total Bilirubin 0.5 0.3 - 1.2 mg/dL   GFR calc non Af Amer >60 >60 mL/min   GFR calc Af Amer >60 >60 mL/min   Anion gap 6 5 - 15     Imaging Review US Venous Img Lower Unilateral Left  04/03/2015  CLINICAL DATA:  Pain and edema x3 days, on anticoagulation, has IVC filter EXAM: LEFT LOWER EXTREMITY VENOUS DOPPLER ULTRASOUND TECHNIQUE: Gray-scale sonography with compression, as well as color and duplex ultrasound, were performed to evaluate the deep venous system from the level of the common femoral vein through the popliteal and proximal calf veins. COMPARISON:  06/22/2013 FINDINGS: Normal compressibility of the common femoral, superficial femoral, and popliteal veins, as well as the proximal calf veins. No filling defects to suggest DVT on grayscale or color Doppler imaging. Doppler waveforms show normal direction of venous flow, normal respiratory phasicity and response to augmentation. Visualized segments of the saphenous venous system normal in caliber and compressibility. Survey views of the contralateral common femoral vein are unremarkable. IMPRESSION: 1. No evidence of lower extremity deep vein thrombosis, LEFT. Electronically Signed   By: Lucrezia Europe M.D.   On: 04/03/2015 16:12   I have personally reviewed and evaluated these images and lab results as part of my medical decision-making.   EKG Interpretation None      MDM   Final diagnoses:  Pain of  left lower extremity    Patient recently postpartum. Delivered on February 3. Patient was on heparin therapy around the time of delivery was on Lovenox prior to that started Lovenox after the delivery. Patient's had a history of pulmonary embolus and a blood clot in the left leg. Patient with onset of left calf pain starting 3 days ago. No shortness of breath no chest pain. Doppler studies are negative for any evidence of a DVT. Patient will be treated symptomatically and have her follow-up with her doctor.   Fredia Sorrow, MD 04/03/15 1718  Fredia Sorrow, MD 04/03/15 1720  Addendum:  Patient after discharge made mention that she was concerned about pain and tenderness to her right breast area. States has been present for a few days. Left breast is fine. Patient is not breast-feeding as noted above. Seems to be consistent with a mastitis. Will treat with clindamycin 450 mg by mouth 3 times day. For 10 days. We'll have her follow-up with her OB/GYN doctor. Also possible could be an early abscess with cellulitis. Follow-up important.  Fredia Sorrow, MD 04/03/15 [phone]

## 2015-04-03 NOTE — ED Notes (Signed)
Discharge instructions reviewed with patient. Patient asking about breast issue. States MD did not evaluate her breast problem she came in with. MD notified of additional breast complaint and will be in to reevaluate her.

## 2015-04-03 NOTE — Discharge Instructions (Signed)
Doppler studies of the leg showed no evidence of a deep vein thrombosis. Take Naprosyn as directed. Supplement the tramadol as needed. May complement follow-up with your doctors. Continue your Lovenox.

## 2015-04-03 NOTE — ED Notes (Signed)
PT states had baby on Feb 3rd and was switched from Lovenox to [place] for delivery and is now taking Lovenox again for blood clot to left leg. PT c/o pain to left lower leg radiating to upper leg x3 days but denies any new injury. PT denies any SOB.

## 2015-04-03 NOTE — ED Notes (Signed)
Duplicate order for [place].

## 2015-04-10 ENCOUNTER — Ambulatory Visit (INDEPENDENT_AMBULATORY_CARE_PROVIDER_SITE_OTHER): Payer: Medicaid Other | Admitting: Women's Health

## 2015-04-10 ENCOUNTER — Encounter: Payer: Self-pay | Admitting: Women's Health

## 2015-04-10 DIAGNOSIS — Z86718 Personal history of other venous thrombosis and embolism: Secondary | ICD-10-CM

## 2015-04-10 DIAGNOSIS — Z86711 Personal history of pulmonary embolism: Secondary | ICD-10-CM

## 2015-04-10 DIAGNOSIS — I10 Essential (primary) hypertension: Secondary | ICD-10-CM

## 2015-04-10 NOTE — Progress Notes (Signed)
Patient ID: [name] [name], female   DOB: Jun 14, 1975, 40 y.o.   MRN: [id number] Subjective:    [name] [name] is a 40 y.o. [phone] [place] American female who presents for a postpartum visit. She is 6 weeks postpartum following a spontaneous vaginal delivery at 39.1 gestational weeks after IOL for [facility]. Anesthesia: epidural. I have fully reviewed the prenatal and intrapartum course. She was d/c'd on maxide 37.5/25mg  daily and norvasc 5mg  daily for bp- states she checks bp once weekly at home and is 130s/70s-80s. Has been under a lot of stress this week d/t fob's family member dying. Was 141/76 @ 3/13 ED visit for Lt leg pain- DVT was ruled out via doppler studies. She was also given cleocin for mastitis Rt breast- pt states has completely resolved. SHe does have h/o saddle embolus and DVT, so she has been on Lovenox 170mg  BID since d/c from hospital after giving birth. Hasn't missed any shots. She sees Dr. Whitney Muse on 05/15/15 and MDs at [facility] on 04/18/15- states they both work w/ her w/ her h/o PE/DVT. Reports pain in Lt leg has improved- was a shooting pain from Lt lateral calf up into Lt lateral thigh. No SOB/CP. Postpartum course has been complicated by above. Baby's course has been uncomplicated. Baby is feeding by bottle. Bleeding on period now, started 04/07/15. Bowel function is normal. Bladder function is normal. Patient is sexually active. Last sexual activity: 03/29/15. Contraception method is oral progesterone-only contraceptive- does well w/ taking at exact same time daily. Postpartum depression screening: negative. Score 4.  Last pap 06/2014 and was neg w/ -HRHPV.  The following portions of the patient's history were reviewed and updated as appropriate: allergies, current medications, past medical history, past surgical history and problem list.  Review of Systems Pertinent items are noted in HPI.   Filed Vitals:   04/10/15 1051  BP: 152/94  Height: 5\' 10"  (1.778 m)  Weight: 362 lb  (164.202 kg)   Patient's last menstrual period was 03/30/2015.  Objective:   General:  alert, cooperative and no distress   Breasts:  normal  Lungs: clear to auscultation bilaterally  Heart:  regular rate and rhythm  Abdomen: soft, nontender   Vulva: normal  Vagina: normal vagina  Cervix:  closed  Corpus: Well-involuted  Adnexa:  Non-palpable  Rectal Exam: No hemorrhoids        Assessment:   Postpartum exam 6 wks s/p SVB after IOL d/t CHTN CHTN H/O saddle embolus and DVT Bottlefeeding Depression screening Contraception counseling   Plan:  Discussed w/ [facility]: to continue current bp meds (maxide 37.5/25 daily and Norvasc 5mg  daily), to let Dr. Delfina Redwood MDs manage Lovenox/any changes in therapy Contraception: continue micronor Follow up in: 3 months for physical, or earlier if needed  Tawnya Crook CNM, [facility] 04/10/2015 11:16 AM

## 2015-04-26 ENCOUNTER — Other Ambulatory Visit (HOSPITAL_COMMUNITY): Payer: Self-pay | Admitting: Oncology

## 2015-04-26 ENCOUNTER — Telehealth (HOSPITAL_COMMUNITY): Payer: Self-pay | Admitting: *Deleted

## 2015-04-26 DIAGNOSIS — I2602 Saddle embolus of pulmonary artery with acute cor pulmonale: Secondary | ICD-10-CM

## 2015-04-26 MED ORDER — OXYCODONE HCL 10 MG PO TABS: 10.0000 mg | ORAL_TABLET | Freq: Four times a day (QID) | ORAL | Status: DC | PRN

## 2015-05-08 ENCOUNTER — Encounter: Payer: Self-pay | Admitting: Emergency Medicine

## 2015-05-08 ENCOUNTER — Ambulatory Visit (INDEPENDENT_AMBULATORY_CARE_PROVIDER_SITE_OTHER): Payer: Medicaid Other | Admitting: Emergency Medicine

## 2015-05-08 VITALS — BP 136/74 | HR 86 | Ht 70.0 in | Wt 351.0 lb

## 2015-05-08 DIAGNOSIS — F17200 Nicotine dependence, unspecified, uncomplicated: Secondary | ICD-10-CM

## 2015-05-08 DIAGNOSIS — G4733 Obstructive sleep apnea (adult) (pediatric): Secondary | ICD-10-CM | POA: Diagnosis not present

## 2015-05-08 NOTE — Progress Notes (Signed)
Subjective:    Patient ID: [name] [name], female    DOB: 1975/09/25, 40 y.o.   MRN: [id number]  HPI 40 y.o. smoker, HTN, obesity, recent admission for PE with associated PAH and RV dilation. She is being treated with xarelto.  She has a hx of dyspnea even preceding the PE. She is a bit better, but still some with exertion.  She snores, has some witnessed apneas and jerks herself awake. She has L>R LE edema. No cough or wheeze.   ROV 09/10/13 -- 40 yo woman, hx PE in 04/2013, OSA, secondary PAH. PSG > shows moderate OSA that responded to CPAP 9cm H2O. She went to have IVCF extracted but there was clot in the filter so they left it in. It will likely be permanent. She has needed O2 with exertion, breathing has been stable.   ROV 12/21/13 -- follow-up visit for secondary pulmonary hypertension in the setting of pulmonary embolism and obstructive sleep apnea. She has a permanent IVC filter. We ordered CPAP mask, doesn't have it yet - trying to get it through an assistance program but not yet done. She is getting her xarelto free until April. She has a dry cough, bothers her more at night. She has occasional reflux.  We have not repeated her TTE yet.   ROV 06/29/14 -- follow-up visit for history of pulmonary embolism and obstructive sleep apnea resulting in secondary pulmonary hypertension. She has an IVC filter in place, was not removed. She has not yet bee able to start CPAP, she needs to be able to pay for it. She's been doing fairly well. She does still have some occasional exertional dyspnea. She does not have significant chest pain. She does still get some left lower extremity pain especially with exertion. She remains on Xarelto and has tolerated this well  ROV 05/08/15 -- patient follows up today for history of pulmonary embolism as well as obstructive sleep apnea. She has an IVC filter in place that was not retrieved. At our last visit we discussed lifelong anticoagulation. She had a child since last  visit in Feb. No dyspnea, no reported issues w PAH. She had stopped smoking but has restarted now that she quit smoking. She also does not yet have CPAP but she does now have insurance.    Review of Systems  Constitutional: Negative for fever and unexpected weight change.  HENT: Negative for congestion, dental problem, ear pain, nosebleeds, postnasal drip, rhinorrhea, sinus pressure, sneezing, sore throat and trouble swallowing.   Eyes: Negative for redness and itching.  Respiratory: Positive for cough and shortness of breath. Negative for chest tightness and wheezing.   Cardiovascular: Negative for chest pain, palpitations and leg swelling.       Hand and feet  Gastrointestinal: Negative for nausea, vomiting and abdominal distention.  Genitourinary: Negative for dysuria.  Musculoskeletal: Negative for joint swelling.  Skin: Negative for rash.  Hematological: Does not bruise/bleed easily.  Psychiatric/Behavioral: Negative for dysphoric mood. The patient is not nervous/anxious.       Objective:   Physical Exam Filed Vitals:   05/08/15 1620 05/08/15 1621  BP:  136/74  Pulse:  86  Height: 5\' 10"  (1.778 m)   Weight: 351 lb (159.213 kg)   SpO2:  100%   Gen: Pleasant, obese woman, in no distress,  normal affect  ENT: No lesions,  mouth clear,  oropharynx clear, no postnasal drip  Neck: No JVD, no TMG, no carotid bruits  Lungs: No use of accessory muscles,  clear without rales or rhonchi  Cardiovascular: RRR, heart sounds normal, no murmur or gallops, no peripheral edema  Musculoskeletal: No deformities, no cyanosis or clubbing, no calf tenderness  Neuro: alert, non focal  Skin: Warm, no lesions or rashes    04/27/13 --  COMPARISON: None.  FINDINGS:  A large saddle embolus is appreciated. Extending from the distal  main pulmonary artery and to the left and right pulmonary arteries  with extension into the left upper lobe, left lower lobe and lingula  branches. There is right  upper lobe, right middle lobe and right  lower lobe extension. Areas of subsegmental extension appreciated.  RV LV ratio 1.65. There are areas of intraventricular septal bowing  to the right.  The thoracic inlet is unremarkable.  There is no evidence of mediastinal masses or adenopathy.  The lungs are clear.  Visualized upper abdominal viscera are unremarkable.  Review of the MIP images confirms the above findings.  IMPRESSION:  Positive for acute PE with CTevidence of right heart strain (RV/LV  Ratio = 1.65 ) consistent with at least submassive (intermediate  risk) PE. The presence of right heart strain has been associated  with an increased risk of morbidity and mortality.    04/28/13 --  Study Conclusions - Study data: Technically difficult study. - Procedure narrative: Transthoracic echocardiography. Image quality was adequate. The study was technically difficult, as a result of body habitus. - Left ventricle: The cavity size was normal. Wall thickness was increased in a pattern of moderate LVH. Systolic function was vigorous. The estimated ejection fraction was in the range of 65% to 70%. Wall motion was normal; there were no regional wall motion abnormalities. Doppler parameters are consistent with abnormal left ventricular relaxation (grade 1 diastolic dysfunction). - Aortic valve: Valve area: 2.01cm^2(VTI). Valve area: 2.11cm^2 (Vmax). - Right ventricle: There is systolic flattening of the interventricular septum consistent with RV pressure overload. The cavity size was severely dilated. Systolic function was mildly reduced. - Pulmonary arteries: Systolic pressure was moderately increased. PA peak pressure: 61mm Hg (S).      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 She had been on xarelto but this was stopped when she was pregnant. She will need to be on lifelong  anticoagulation since she has an IVC filter in place that was not retrieved.   Secondary pulmonary hypertension Due to large pulmonary embolism and also obstructive sleep apnea. She has been on anticoagulation but her sleep apnea has not yet been treated. She does now have insurance and we would like to get her on CPAP soon as possible. I will recheck an echocardiogram after she has been on CPAP for several months to evaluate her PA pressures  Obstructive sleep apnea Initiate CPAP 9 cm water, best fit mask  Tobacco use disorder Discussed the importance of cessation with her today. She is motivated to quit and will contact us if we can help her anyway.

## 2015-05-08 NOTE — Assessment & Plan Note (Signed)
Currently being treated with an enoxaparin twice a day. She had been on xarelto but this was stopped when she was pregnant. She will need to be on lifelong anticoagulation since she has an IVC filter in place that was not retrieved.

## 2015-05-08 NOTE — Assessment & Plan Note (Signed)
Initiate CPAP 9 cm water, best fit mask

## 2015-05-08 NOTE — Patient Instructions (Addendum)
We will order CPAP 9 cm water pressure through [employer] in [place].  If for some reason we're unable to obtain her CPAP then we may need to repeat your sleep study. Please continue your blood thinning medication as directed by the clinic at [facility] We will need to repeat your echocardiogram after you have been on CPAP for several months to evaluate your pulmonary hypertension Please continue to work on stopping smoking Follow with Dr Lamonte Sakai in 4 months or sooner if you have any problems.

## 2015-05-08 NOTE — Assessment & Plan Note (Signed)
Due to large pulmonary embolism and also obstructive sleep apnea. She has been on anticoagulation but her sleep apnea has not yet been treated. She does now have insurance and we would like to get her on CPAP soon as possible. I will recheck an echocardiogram after she has been on CPAP for several months to evaluate her PA pressures

## 2015-05-08 NOTE — Assessment & Plan Note (Signed)
Discussed the importance of cessation with her today. She is motivated to quit and will contact us if we can help her anyway.

## 2015-05-09 ENCOUNTER — Telehealth: Payer: Self-pay | Admitting: Emergency Medicine

## 2015-05-09 DIAGNOSIS — G4733 Obstructive sleep apnea (adult) (pediatric): Secondary | ICD-10-CM

## 2015-05-09 NOTE — Telephone Encounter (Signed)
Spoke with [facility]-Wanda is the supervisor and not there at time of my call; we are unsure why patient should need new sleep study since her original sleep study was in 2015 and never had CPAP therapy in past. Is this a new guideline from Medicaid?   Message was left for Mariann Laster to contact me to help get these answers.

## 2015-05-10 NOTE — Telephone Encounter (Signed)
Patient returned call, CB is [phone].

## 2015-05-10 NOTE — Telephone Encounter (Signed)
Per [place], pt cannot undergo HST per insurance. Pt must have in-lab sleep study.   RB, ok to order?  Thanks.

## 2015-05-10 NOTE — Telephone Encounter (Signed)
lmtcb X1 for pt to make aware.  Will order HST after speaking to pt.

## 2015-05-10 NOTE — Telephone Encounter (Signed)
Spoke with pt, aware of hst needing to be done to have insurance cover cpap machine.  Pt expressed understanding. Hst ordered.  Nothing further needed.

## 2015-05-10 NOTE — Telephone Encounter (Signed)
[facility] returning call and can be reached at [phone].Hillery Hunter

## 2015-05-10 NOTE — Telephone Encounter (Signed)
Spoke with Mariann Laster at [facility]  She states that per Medicaid guidelines the sleep study can not be more than 1 year ago  RB- please advise if okay to order new sleep study, and if so at home or sleep lab? Thanks!

## 2015-05-10 NOTE — Telephone Encounter (Signed)
Should be ok to get a home study.

## 2015-05-11 NOTE — Telephone Encounter (Signed)
Yes please order a split night sleep study.

## 2015-05-15 ENCOUNTER — Encounter (HOSPITAL_COMMUNITY): Payer: Medicaid Other | Attending: Hematology & Oncology | Admitting: Hematology & Oncology

## 2015-05-15 ENCOUNTER — Encounter (HOSPITAL_COMMUNITY): Payer: Medicaid Other

## 2015-05-15 ENCOUNTER — Encounter (HOSPITAL_COMMUNITY): Payer: Self-pay | Admitting: Hematology & Oncology

## 2015-05-15 VITALS — BP 142/90 | HR 85 | Temp 98.0°F | Resp 18 | Wt 348.0 lb

## 2015-05-15 DIAGNOSIS — I2699 Other pulmonary embolism without acute cor pulmonale: Secondary | ICD-10-CM

## 2015-05-15 DIAGNOSIS — Z7901 Long term (current) use of anticoagulants: Secondary | ICD-10-CM | POA: Insufficient documentation

## 2015-05-15 DIAGNOSIS — Z86718 Personal history of other venous thrombosis and embolism: Secondary | ICD-10-CM | POA: Diagnosis not present

## 2015-05-15 DIAGNOSIS — D500 Iron deficiency anemia secondary to blood loss (chronic): Secondary | ICD-10-CM | POA: Diagnosis not present

## 2015-05-15 DIAGNOSIS — F17210 Nicotine dependence, cigarettes, uncomplicated: Secondary | ICD-10-CM | POA: Diagnosis not present

## 2015-05-15 DIAGNOSIS — I2602 Saddle embolus of pulmonary artery with acute cor pulmonale: Secondary | ICD-10-CM

## 2015-05-15 DIAGNOSIS — Z720 Tobacco use: Secondary | ICD-10-CM

## 2015-05-15 DIAGNOSIS — I2692 Saddle embolus of pulmonary artery without acute cor pulmonale: Secondary | ICD-10-CM

## 2015-05-15 DIAGNOSIS — D509 Iron deficiency anemia, unspecified: Secondary | ICD-10-CM | POA: Diagnosis not present

## 2015-05-15 DIAGNOSIS — G4733 Obstructive sleep apnea (adult) (pediatric): Secondary | ICD-10-CM | POA: Insufficient documentation

## 2015-05-15 LAB — CBC WITH DIFFERENTIAL/PLATELET
Basophils Absolute: 0 10*3/uL (ref 0.0–0.1)
Basophils Relative: 0 %
EOS ABS: 0.1 10*3/uL (ref 0.0–0.7)
EOS PCT: 1 %
HCT: 39.7 % (ref 36.0–46.0)
Hemoglobin: 13.2 g/dL (ref 12.0–15.0)
LYMPHS ABS: 1.1 10*3/uL (ref 0.7–4.0)
Lymphocytes Relative: 21 %
MCH: 30.3 pg (ref 26.0–34.0)
MCHC: 33.2 g/dL (ref 30.0–36.0)
MCV: 91.3 fL (ref 78.0–100.0)
MONOS PCT: 8 %
Monocytes Absolute: 0.4 10*3/uL (ref 0.1–1.0)
Neutro Abs: 3.4 10*3/uL (ref 1.7–7.7)
Neutrophils Relative %: 70 %
PLATELETS: 236 10*3/uL (ref 150–400)
RBC: 4.35 MIL/uL (ref 3.87–5.11)
RDW: 14.7 % (ref 11.5–15.5)
WBC: 4.9 10*3/uL (ref 4.0–10.5)

## 2015-05-15 LAB — FERRITIN: FERRITIN: 36 ng/mL (ref 11–307)

## 2015-05-15 NOTE — Progress Notes (Signed)
[name] [name], [place] [id number]  Pulmonary embolus,  CT 04/27/2013 saddle embolism IVC filter 04/29/2013 LE Ultrasound  2 miscarriages one at 9 weeks, 6 weeks Iron deficiency Anemia with pagophagia, heavy menses on Xarelto Smokes 2 to 3 cigarettes a day Delivery of healthy baby girl, 02/2015  CURRENT THERAPY:Lovenox  INTERVAL HISTORY: [name] [name] 40 y.o. female returns for follow-up of her history of PE. She is also followed for iron deficiency anemia. She continues on lovenox. She is doing well. She was seen at [facility] with recommendations as follows:  Patient Instructions - [name] Linsey, MD - 04/18/2015 9:20 AM EDT You should continue on blood thinners long term due to your previous unprovoked clots, especially given the severe nature of your lung clots. Similar to my recommendations at your initial visit, I would not recommend transitioning to one of the new oral anticoagulants like Xarelto at this time. These medications have not been studied well in individuals with weights > ~250 pounds, so we don't know how effective they are in this population. As such, if you wish to change from the Lovenox shots to an oral medication, I would recommend warfarin or Coumadin. You would start this and overlap it with Lovenox until your Coumadin levels are in the therapeutic range. This therapeutic range is an INR of 2-3. You will need to have someone locally to follow your INR levels and adjust Coumadin doses as needed. This is typically done by a primary care doctor, although Dr. Whitney Muse may be able to assist as well. You should be mindful of having stable intake of vitamin K containing foods while on Coumadin. You do not need to eliminate them from your diet but just eat a similar amount every week. Otherwise it will make keeping your Coumadin levels in the therapeutic range of difficult task. I anticipate having data in the next few years on the use of the new oral  anticoagulants at the extremes of the body weight spectrum, so likely we can transitioning to one of these medicines in the future.  Regarding your left leg pain symptoms, I imagine it is due to something called post-thrombotic syndrome. The main treatment for this is to wear compression stockings. Try the over-the-counter type first and if no improvement then you can fill the prescription provided today for medical grade stockings. If ultimately you cannot tolerate the stockings or they do not improve symptoms significantly, then there is no need to continue wearing them. I recommend that you avoid any estrogen containing systemic hormonal preparations. The safest hormonal option for birth control is the [place] or [place] IUDs. The next safest options are the progesterone only pills or other progesterone only preparations. Taking them while on anticoagulation is likely safe, but again I would recommend avoiding estrogen containing preparations. If you were to become pregnant again in the future, you would need to be treated with Lovenox throughout the pregnancy like before.  For now, lets plan to see back here every 1-2 years to reassess the decision for long-term blood thinners and if there are alternative options available to you. I'm happy to see her sooner if new questions or problems,.   [name] [name] returns to the [place] alone today.  She went to Dr. Lamonte Sakai last Monday and they discussed switching her from the Lovenox injections to Coumadin. She has decided that she wants to continue doing the injections.   Her last period was from April 10th to  April 17th.   Her daughter now sleeps through the night. Her mother is taking care of her daughter today.  She has bruising on her abdomen from her Lovenox injections. She thinks she is doing them correctly but has a lot of bruising and hematoma formation.  MEDICAL HISTORY: Past Medical History  Diagnosis Date  . Closed left ankle fracture      August 30 2012  . Obesity   . Tobacco use disorder   . High blood pressure   . Pulmonary embolism ([place]) 04/2013  . OSA on CPAP   . Pregnancy     09/20/14 17 weeks  . Fibroid tumor   . DVT (deep venous thrombosis) ([facility])     L leg    has Pleuritic chest pain; SOB (shortness of breath); Hyperglycemia; Anemia; Tobacco use disorder; Saddle embolus of pulmonary artery with acute cor pulmonale ([place]); Secondary pulmonary hypertension ([place]); Obstructive sleep apnea; Back pain with left-sided radiculopathy; Iron deficiency anemia due to chronic blood loss; Benign essential hypertension; Morbid obesity ([place]); and Fibroid, uterine on her problem list.     is allergic to sulfa antibiotics.  [name] [name] does not currently have medications on file.  SURGICAL HISTORY: Past Surgical History  Procedure Laterality Date  . Cyst removal trunk    . Abscess drainage Bilateral 01/10/15  . Ivc filter placement ([facility] hx)      SOCIAL HISTORY: Social History   Social History  . Marital Status: Widowed    Spouse Name: N/A  . Number of Children: N/A  . Years of Education: N/A   Occupational History  . Not on file.   Social History Main Topics  . Smoking status: Current Every Day Smoker -- 0.25 packs/day for 18 years    Types: Cigarettes  . Smokeless tobacco: Never Used  . Alcohol Use: No  . Drug Use: No  . Sexual Activity: Yes    Birth Control/ Protection: None   Other Topics Concern  . Not on file   Social History Narrative    FAMILY HISTORY: Family History  Problem Relation Age of Onset  . Hypertension Mother   . Diabetes Paternal Aunt   . Diabetes Paternal Uncle   . Other Father     blood clots  . Cancer Maternal Grandmother   . Heart attack Paternal Grandmother     Review of Systems  Constitutional: Negative.   HENT: Negative.   Eyes: Negative for blurred vision, double vision, photophobia, pain, discharge and redness.       Wears glasses  Respiratory:Negative for cough,  hemoptysis, sputum production and wheezing.   Cardiovascular: Negative.   Gastrointestinal: Negative for nausea, vomiting, abdominal pain, diarrhea, constipation, blood in stool and melena.  Genitourinary: Negative.   Musculoskeletal:Negative for myalgias, joint pain, back pain, falls and neck pain.  Skin: Negative.   Neurological: Negative.   Endo/Heme/Allergies: Positive for bruising. Bruising on abdomen from Lovenox injections.    Psychiatric/Behavioral: Negative.   14 point review of systems was performed and is negative except as detailed under history of present illness and above   PHYSICAL EXAMINATION  ECOG PERFORMANCE STATUS: 1 - Symptomatic but completely ambulatory  Filed Vitals:   05/15/15 1300  BP: 142/90  Pulse: 85  Temp: 98 F (36.7 C)  Resp: 18    Physical Exam  Constitutional: She is oriented to person, place, and time and well-developed, well-nourished, and in no distress.  Obese HENT:  Head: Normocephalic and atraumatic.  Nose: Nose normal.  Mouth/Throat: Oropharynx  is clear and moist. No oropharyngeal exudate.  Eyes: Conjunctivae and EOM are normal. Pupils are equal, round, and reactive to light. Right eye exhibits no discharge. Left eye exhibits no discharge. No scleral icterus.  Neck: Normal range of motion. Neck supple. No tracheal deviation present. No thyromegaly present.  Cardiovascular: Normal rate, regular rhythm and normal heart sounds.  Exam reveals no gallop and no friction rub.   No murmur heard. Pulmonary/Chest: Effort normal and breath sounds normal. She has no wheezes. She has no rales.  Abdominal: Soft. Bowel sounds are normal. Multiple ecchymoses on the abdomen, hematoma approximately 5 cm in size Musculoskeletal: Normal range of motion. She exhibits no edema.  Lymphadenopathy:    She has no cervical adenopathy.  Neurological: She is alert and oriented to person, place, and time. She has normal reflexes. No cranial nerve deficit. Gait  normal. Coordination normal.  Skin: Skin is warm and dry. No rash noted.  Psychiatric: Mood, memory, affect and judgment normal.  Nursing note and vitals reviewed.   LABORATORY DATA: I have reviewed the data as listed.  CBC    Component Value Date/Time   WBC 4.9 05/15/2015 1302   WBC 5.9 01/18/2015 1653   RBC 4.35 05/15/2015 1302   RBC 3.95 01/18/2015 1653   HGB 13.2 05/15/2015 1302   HCT 39.7 05/15/2015 1302   HCT 37.7 01/18/2015 1653   PLT 236 05/15/2015 1302   PLT 214 01/18/2015 1653   MCV 91.3 05/15/2015 1302   MCV 95 01/18/2015 1653   MCH 30.3 05/15/2015 1302   MCH 31.6 01/18/2015 1653   MCHC 33.2 05/15/2015 1302   MCHC 33.2 01/18/2015 1653   RDW 14.7 05/15/2015 1302   RDW 14.3 01/18/2015 1653   LYMPHSABS 1.1 05/15/2015 1302   MONOABS 0.4 05/15/2015 1302   EOSABS 0.1 05/15/2015 1302   BASOSABS 0.0 05/15/2015 1302   CMP     Component Value Date/Time   NA 137 04/03/2015 1420   NA 139 01/18/2015 1653   K 4.3 04/03/2015 1420   CL 102 04/03/2015 1420   CO2 29 04/03/2015 1420   GLUCOSE 94 04/03/2015 1420   GLUCOSE 71 01/18/2015 1653   BUN 19 04/03/2015 1420   BUN 9 01/18/2015 1653   CREATININE 1.06* 04/03/2015 1420   CALCIUM 8.9 04/03/2015 1420   PROT 8.3* 04/03/2015 1420   PROT 7.1 01/18/2015 1653   ALBUMIN 3.3* 04/03/2015 1420   ALBUMIN 3.7 01/18/2015 1653   AST 13* 04/03/2015 1420   ALT 11* 04/03/2015 1420   ALKPHOS 57 04/03/2015 1420   BILITOT 0.5 04/03/2015 1420   BILITOT <0.2 01/18/2015 1653   GFRNONAA >60 04/03/2015 1420   GFRAA >60 04/03/2015 1420    RADIOGRAPHIC STUDIES: I have personally reviewed the radiological images as listed and agreed with the findings in the report.  EXAM: CT ANGIOGRAPHY CHEST WITH CONTRAST  IMPRESSION: Positive for acute PE with CTevidence of right heart strain (RV/LV Ratio = 1.65 ) consistent with at least submassive (intermediate risk) PE. The presence of right heart strain has been associated with an  increased risk of morbidity and mortality. Critical Value/emergent results were called by telephone at the time of interpretation on 04/27/2013 at 6:19 PM to Dr. Milton Ferguson , who verbally acknowledged these results.   Electronically Signed  By: Margaree Mackintosh M.D.  On: 04/27/2013 18:20 IMPRESSION: Positive for acute PE with CTevidence of right heart strain (RV/LV Ratio = 1.65 ) consistent with at least submassive (intermediate risk) PE. The presence  of right heart strain has been associated with an increased risk of morbidity and mortality. Critical Value/emergent results were called by telephone at the time of interpretation on 04/27/2013 at 6:19 PM to Dr. Milton Ferguson , who verbally acknowledged these results.   Electronically Signed  By: Margaree Mackintosh M.D.  On: 04/27/2013 18:20    ASSESSMENT and THERAPY PLAN:  Anemia, iron deficiency Large Saddle PE Tobacco Use IVC filter Multiple pregnancy losses Long term anticoagulation   She wishes to remain on lovenox currently. I reviewed recommendations from [facility]. She is going to come by one morning and have nursing review her technique on administration of her lovenox. Recommendations are for lifelong anticoagulation.   Iron studies are pending today.  We will replace accordingly. She has difficulties tolerating oral iron. We will resume her prior follow-up schedule.   Orders Placed This Encounter  Procedures  . CBC with Differential    Standing Status: Standing     Number of Occurrences: 10     Standing Expiration Date: 05/14/2017  . Ferritin    Standing Status: Standing     Number of Occurrences: 10     Standing Expiration Date: 05/15/2018   She will have blood work done in 3 months.  She will return for a follow up in 6 months.   All questions were answered. The patient knows to call the clinic with any problems, questions or concerns. We can certainly see the patient much sooner if necessary.  This  document serves as a record of services personally performed by [name] Linsey, MD. It was created on her behalf by Kandace Blitz, a trained medical scribe. The creation of this record is based on the scribe's personal observations and the provider's statements to them. This document has been checked and approved by the attending provider.  I have reviewed the above documentation for accuracy and completeness and I agree with the above.  Donald Pore MD

## 2015-05-15 NOTE — Patient Instructions (Addendum)
[place] at [facility] Discharge Instructions  RECOMMENDATIONS MADE BY THE CONSULTANT AND ANY TEST RESULTS WILL BE SENT TO YOUR REFERRING PHYSICIAN.   Exam and discussion by Dr Whitney Muse today CBC (blood counts look good today), iron level pending we will call you with those results 160 mcg of lovenox twice a day Return tomorrow morning at 8am so someone can watch you give yourself your shot  Blood work every 3 months  Return to see the doctor in 6 months Please call the clinic if you have any questions or concerns     Thank you for choosing [place] at [facility] to provide your oncology and hematology care.  To afford each patient quality time with our provider, please arrive at least 15 minutes before your scheduled appointment time.   Beginning January 23rd 2017 lab work for the [facility] will be done in the  Main lab at [facility] on 1st floor. If you have a lab appointment with the [facility] please come in thru the  Main Entrance and check in at the main information desk  You need to re-schedule your appointment should you arrive 10 or more minutes late.  We strive to give you quality time with our providers, and arriving late affects you and other patients whose appointments are after yours.  Also, if you no show three or more times for appointments you may be dismissed from the clinic at the providers discretion.     Again, thank you for choosing [facility].  Our hope is that these requests will decrease the amount of time that you wait before being seen by our physicians.       _____________________________________________________________  Should you have questions after your visit to [facility], please contact our office at [phone] between the hours of 8:30 a.m. and 4:30 p.m.  Voicemails left after 4:30 p.m. will not be returned until the following business day.  For prescription refill  requests, have your pharmacy contact our office.         Resources For Cancer Patients and their Caregivers ? American Cancer Society: Can assist with transportation, wigs, general needs, runs Look Good Feel Better.        (289)086-8339 ? Cancer Care: Provides financial assistance, online support groups, medication/co-pay assistance.  1-800-813-HOPE 626-528-1235) ? [place] Assists [place] cancer patients and their families through emotional , educational and financial support.  (405) 755-7435 ? [place] DSS Where to apply for food stamps, Medicaid and utility assistance. 431 650 2831 ? [employer]: Transportation to medical appointments. (208)315-7404 ? Social Security Administration: May apply for disability if have a Stage IV cancer. 6233167622 707-472-5790 ? [employer], Disability and Transit Services: Assists with nutrition, care and transit needs. (979) 436-5257

## 2015-05-23 ENCOUNTER — Other Ambulatory Visit (HOSPITAL_COMMUNITY): Payer: Self-pay | Admitting: Oncology

## 2015-05-23 ENCOUNTER — Telehealth (HOSPITAL_COMMUNITY): Payer: Self-pay | Admitting: *Deleted

## 2015-05-23 DIAGNOSIS — I2602 Saddle embolus of pulmonary artery with acute cor pulmonale: Secondary | ICD-10-CM

## 2015-05-23 MED ORDER — OXYCODONE HCL 10 MG PO TABS: 10.0000 mg | ORAL_TABLET | Freq: Four times a day (QID) | ORAL | Status: DC | PRN

## 2015-05-23 NOTE — Telephone Encounter (Signed)
Printed and ready for pick up. 

## 2015-06-20 ENCOUNTER — Telehealth (HOSPITAL_COMMUNITY): Payer: Self-pay | Admitting: *Deleted

## 2015-06-20 ENCOUNTER — Other Ambulatory Visit: Payer: Self-pay | Admitting: Women's Health

## 2015-06-20 ENCOUNTER — Other Ambulatory Visit (HOSPITAL_COMMUNITY): Payer: Self-pay | Admitting: Oncology

## 2015-06-20 DIAGNOSIS — I2602 Saddle embolus of pulmonary artery with acute cor pulmonale: Secondary | ICD-10-CM

## 2015-06-20 MED ORDER — OXYCODONE HCL 10 MG PO TABS: 10.0000 mg | ORAL_TABLET | Freq: Four times a day (QID) | ORAL | Status: DC | PRN

## 2015-06-20 MED ORDER — ENOXAPARIN SODIUM 100 MG/ML ~~LOC~~ SOLN: 170.0000 mg | Freq: Two times a day (BID) | SUBCUTANEOUS | Status: DC

## 2015-06-20 NOTE — Telephone Encounter (Signed)
Lovenox escribed.  Pain medication is printed.  TK

## 2015-07-12 ENCOUNTER — Other Ambulatory Visit: Payer: Medicaid Other | Admitting: Adult Health

## 2015-07-17 ENCOUNTER — Other Ambulatory Visit (HOSPITAL_COMMUNITY): Payer: Self-pay | Admitting: Oncology

## 2015-07-17 DIAGNOSIS — I2602 Saddle embolus of pulmonary artery with acute cor pulmonale: Secondary | ICD-10-CM

## 2015-07-17 MED ORDER — OXYCODONE HCL 10 MG PO TABS: 10.0000 mg | ORAL_TABLET | Freq: Four times a day (QID) | ORAL | Status: DC | PRN

## 2015-07-20 ENCOUNTER — Other Ambulatory Visit: Payer: Self-pay | Admitting: Women's Health

## 2015-07-21 ENCOUNTER — Other Ambulatory Visit: Payer: Medicaid Other | Admitting: Adult Health

## 2015-08-02 ENCOUNTER — Other Ambulatory Visit: Payer: Medicaid Other | Admitting: Adult Health

## 2015-08-07 ENCOUNTER — Encounter: Payer: Self-pay | Admitting: Women's Health

## 2015-08-07 ENCOUNTER — Other Ambulatory Visit: Payer: Medicaid Other | Admitting: Women's Health

## 2015-08-10 ENCOUNTER — Telehealth (HOSPITAL_COMMUNITY): Payer: Self-pay | Admitting: *Deleted

## 2015-08-10 ENCOUNTER — Other Ambulatory Visit (HOSPITAL_COMMUNITY): Payer: Self-pay | Admitting: Oncology

## 2015-08-10 DIAGNOSIS — I2602 Saddle embolus of pulmonary artery with acute cor pulmonale: Secondary | ICD-10-CM

## 2015-08-10 MED ORDER — OXYCODONE HCL 10 MG PO TABS: 10.0000 mg | ORAL_TABLET | Freq: Four times a day (QID) | ORAL | Status: DC | PRN

## 2015-08-14 ENCOUNTER — Emergency Department (HOSPITAL_COMMUNITY): Payer: Medicaid Other

## 2015-08-14 ENCOUNTER — Encounter (HOSPITAL_COMMUNITY): Payer: Self-pay | Admitting: Emergency Medicine

## 2015-08-14 ENCOUNTER — Emergency Department (HOSPITAL_COMMUNITY)
Admission: EM | Admit: 2015-08-14 | Discharge: 2015-08-14 | Disposition: A | Discharge status: Home / Self Care | Payer: Medicaid Other | Attending: Emergency Medicine | Admitting: Emergency Medicine

## 2015-08-14 DIAGNOSIS — M545 Low back pain, unspecified: Secondary | ICD-10-CM

## 2015-08-14 DIAGNOSIS — M5441 Lumbago with sciatica, right side: Secondary | ICD-10-CM

## 2015-08-14 DIAGNOSIS — F17210 Nicotine dependence, cigarettes, uncomplicated: Secondary | ICD-10-CM | POA: Insufficient documentation

## 2015-08-14 MED ORDER — DIAZEPAM 5 MG PO TABS: 5.0000 mg | ORAL_TABLET | Freq: Two times a day (BID) | ORAL | 0 refills | Status: DC

## 2015-08-14 MED ORDER — HYDROMORPHONE HCL 1 MG/ML IJ SOLN
1.0000 mg | Freq: Once | INTRAMUSCULAR | Status: AC
  Administered 2015-08-14: 1 mg via INTRAMUSCULAR
  Filled 2015-08-14: qty 1

## 2015-08-14 NOTE — ED Notes (Signed)
Pt with lower back pain that radiates down right leg.  Took oxycodone earlier today.

## 2015-08-14 NOTE — ED Triage Notes (Signed)
Pt reports lower back pain that began yesterday. Pt reports more intense pain this am and reports right leg numbness. Pt denies any injury,gi/gu symptoms.

## 2015-08-14 NOTE — Discharge Instructions (Signed)
Call the neurosurgery group listed to arrange a follow-up appt.  Return to ER for any worsening symptoms

## 2015-08-14 NOTE — ED Provider Notes (Signed)
[facility] DEPT Provider Note   CSN: [id number] Arrival date & time: 08/14/15  1220  First Provider Contact:  08/14/15 1350    By signing my name below, I, [facility], attest that this documentation has been prepared under the direction and in the presence of Revin Corker, PA-C. Electronically Signed: Virgel Bouquet, ED Scribe. 08/14/15. 3:12 PM.   History   Chief Complaint Chief Complaint  Patient presents with  . Back Pain    HPI [name] FARAHANI is a 40 y.o. female who presents to the Emergency Department complaining of intermittent, moderate, gradually worsening back pain onset yesterday. Pt states that over the past two weeks she has had intermittent bilateral leg pain, worse on the right (only in right leg currently), followed by lower back pain yesterday while at rest. She reports associated intermittent numbness to her BLE just below the knee. Denies recent injuries or heavy lifting. Per pt, she has a hx of DVT and PE that required surgical repair with IVC filter placement and daily use of Lovonex (most recently this morning) and takes oxycodone regularly. Denies fevers, CP, SOB, dysuria, or weakness, urine or bowel changes  The history is provided by the patient. No language interpreter was used.  Back Pain   This is a new problem. The current episode started yesterday. The problem occurs constantly. The problem has been gradually worsening. The pain is associated with no known injury. The pain is present in the lumbar spine. The pain is moderate. Associated symptoms include numbness and leg pain. Pertinent negatives include no chest pain, no fever, no dysuria and no weakness. She has tried nothing for the symptoms. The treatment provided no relief.    Past Medical History:  Diagnosis Date  . Closed left ankle fracture    August 30 2012  . DVT (deep venous thrombosis) (HCC)    L leg  . Fibroid tumor   . High blood pressure   . Obesity   . OSA on CPAP   .  Pregnancy    09/20/14 17 weeks  . Pulmonary embolism ([place]) 04/2013  . Tobacco use disorder     Patient Active Problem List   Diagnosis Date Noted  . Morbid obesity ([place]) 08/18/2014  . Fibroid, uterine 08/18/2014  . Benign essential hypertension 07/21/2014  . Iron deficiency anemia due to chronic blood loss 02/05/2014  . Back pain with left-sided radiculopathy 10/31/2013  . Secondary pulmonary hypertension ([place]) 07/06/2013  . Obstructive sleep apnea 07/06/2013  . Pleuritic chest pain 04/27/2013  . SOB (shortness of breath) 04/27/2013  . Hyperglycemia 04/27/2013  . Anemia 04/27/2013  . Tobacco use disorder 04/27/2013  . 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 ABSCESS DRAINAGE Bilateral 01/10/15  . CYST REMOVAL TRUNK    . IVC FILTER PLACEMENT ([facility] HX)      OB History    Gravida Para Term Preterm AB Living   4 1 1   3 1    SAB TAB Ectopic Multiple Live Births   3     0         Home Medications    Prior to Admission medications   Medication Sig Start Date End Date Taking? Authorizing Provider  amLODipine (NORVASC) 5 MG tablet Take 0.5 tablets (2.5 mg total) by mouth daily. Patient taking differently: Take 5 mg by mouth daily.  03/02/15   Florian Buff, MD  clindamycin (CLEOCIN) 150  MG capsule Take 3 capsules (450 mg total) by mouth 3 (three) times daily. 04/03/15   Fredia Sorrow, MD  enoxaparin (LOVENOX) 100 MG/ML injection Inject 1.7 mLs (170 mg total) into the skin every 12 (twelve) hours. 06/20/15   Baird Cancer, PA-C  hydrochlorothiazide (HYDRODIURIL) 25 MG tablet TAKE ONE TABLET BY MOUTH ONCE DAILY 07/24/15   Roma Schanz, CNM  naproxen (NAPROSYN) 500 MG tablet Take 1 tablet (500 mg total) by mouth 2 (two) times daily. 04/03/15   Fredia Sorrow, MD  norethindrone (MICRONOR,CAMILA,ERRIN) 0.35 MG tablet Take 1 tablet (0.35 mg total) by mouth daily. Take 1 a day 03/09/15    Florian Buff, MD  Oxycodone HCl 10 MG TABS Take 1 tablet (10 mg total) by mouth every 6 (six) hours as needed. 08/10/15   Baird Cancer, PA-C  traMADol (ULTRAM) 50 MG tablet Take 1 tablet (50 mg total) by mouth every 6 (six) hours as needed. Patient not taking: Reported on 04/10/2015 04/03/15   Fredia Sorrow, MD  triamterene-hydrochlorothiazide (MAXZIDE-25) 37.5-25 MG tablet Take 1 tablet by mouth daily. 03/02/15   Florian Buff, MD    Family History Family History  Problem Relation Age of Onset  . Hypertension Mother   . Diabetes Paternal Aunt   . Diabetes Paternal Uncle   . Other Father     blood clots  . Cancer Maternal Grandmother   . Heart attack Paternal Grandmother     Social History Social History  Substance Use Topics  . Smoking status: Current Every Day Smoker    Packs/day: 0.25    Years: 18.00    Types: Cigarettes  . Smokeless tobacco: Never Used  . Alcohol use No     Allergies   Sulfa antibiotics   Review of Systems Review of Systems  Constitutional: Negative for fever.  Respiratory: Negative for shortness of breath.   Cardiovascular: Negative for chest pain.  Genitourinary: Negative for dysuria.  Musculoskeletal: Positive for back pain.  Neurological: Positive for numbness. Negative for weakness.     Physical Exam Updated Vital Signs BP 142/82 (BP Location: Left Arm)   Pulse 95   Temp 97.6 F (36.4 C) (Oral)   Resp 14   Ht 5\' 9"  (1.753 m)   Wt (!) 342 lb (155.1 kg)   LMP 08/03/2015   SpO2 99%   BMI 50.50 kg/m   Physical Exam  Constitutional: She is oriented to person, place, and time. She appears well-developed and well-nourished. No distress.  HENT:  Head: Normocephalic and atraumatic.  Eyes: Conjunctivae are normal.  Neck: Normal range of motion.  Cardiovascular: Normal rate.   Pulmonary/Chest: Effort normal. No respiratory distress.  Musculoskeletal: Normal range of motion.  Tenderness bilateral L-spine paraspinal muscles and right  SI joint. No erythema or edema of the lower extremities. Sensation intact. DP pulses equal.  Neurological: She is alert and oriented to person, place, and time.  Skin: Skin is warm and dry.  Psychiatric: She has a normal mood and affect. Her behavior is normal.  Nursing note and vitals reviewed.    ED Treatments / Results  DIAGNOSTIC STUDIES: Oxygen Saturation is 99% on RA, normal by my interpretation.    COORDINATION OF CARE: 2:13 PM Will check NCCSRS prior to ordering pain medication in ED today. Discussed treatment plan with pt at bedside and pt agreed to plan.   3:07 PM Spoke with Radiology tech who reported that an MRI cannot be performed at [facility] today due to the  pt's weight and IVC filter. Will return to speak with pt about treatment options.  3:11 PM Discussed with Dr. Roderic Palau, and pt agrees to ordering a CT scan today.  Radiology Ct Lumbar Spine Wo Contrast  Result Date: 08/14/2015 CLINICAL DATA:  Chronic low back pain extending at the right lower extremity. Symptoms have progressed since yesterday. The patient is unable to undergo MRI. EXAM: CT LUMBAR SPINE WITHOUT CONTRAST TECHNIQUE: Multidetector CT imaging of the lumbar spine was performed without intravenous contrast administration. Multiplanar CT image reconstructions were also generated. COMPARISON:  None. FINDINGS: Five non rib-bearing lumbar type vertebral bodies are present. Mild focal leftward curvature of the lumbar spine is centered at L4-5. There slight anterolisthesis at L4-5 as well measuring 5 mm. Alignment is otherwise anatomic. Vertebral body heights are intact. An IVC filter is in place. Limited imaging of the abdomen is otherwise unremarkable. Chronic bone changes are associated sacroiliitis bilaterally. L1-2: Mild facet hypertrophy is present bilaterally without significant stenosis. L2-3: Mild facet hypertrophy is present. There is no significant stenosis. L3-4: A broad-based disc protrusion is asymmetric to  the left moderate facet hypertrophy and ligamentum flavum thickening is present. Mild central canal stenosis and moderate foraminal narrowing is present bilaterally, worse on the left. L4-5: A broad-based disc protrusion is present. Advanced facet hypertrophy is noted bilaterally. A vacuum phenomenon is present in the right facet. Severe central canal stenosis is present. Severe right and moderate left foraminal narrowing is evident. L5-S1: A central disc protrusion is present. Mild facet hypertrophy is noted bilaterally. This results an mild central and right foraminal narrowing. IMPRESSION: 1. Advanced facet hypertrophy results and 5 mm grade 1 anterolisthesis at L4-5. 2. There is uncovering of a broad-based disc protrusion at L4-5 with severe central canal stenosis, severe right, and moderate left foraminal narrowing. 3. Mild facet hypertrophy at L1-2 and L2-3 without significant stenosis at these levels. 4. Mild central and moderate bilateral foraminal stenosis at L3-4. 5. Mild central and right foraminal stenosis at L5-S1. Electronically Signed   By: San Morelle M.D.   On: 08/14/2015 17:05   Procedures Procedures   Medications Ordered in ED Medications  HYDROmorphone (DILAUDID) injection 1 mg (1 mg Intramuscular Given 08/14/15 1437)     Initial Impression / Assessment and Plan / ED Course  I have reviewed the triage vital signs and the nursing notes.  Pertinent labs & imaging results that were available during my care of the patient were reviewed by me and considered in my medical decision making (see chart for details).  Clinical Course   Patient is feeling better after I am medications. She is resting comfortably. She is ambulated to the restroom with a steady gait. No focal neuro deficits on exam. No concerning symptoms for emergency neurological process.  I discussed CT findings with the patient and she agrees to follow up with neurosurgery. She appears stable for discharge.  Return precautions were given.  Patient has oxycodone at home, prescription for short course of Valium was written   Final Clinical Impressions(s) / ED Diagnoses   Final diagnoses:  Low back pain  Bilateral low back pain with right-sided sciatica    New Prescriptions New Prescriptions   No medications on file   I personally performed the services described in this documentation, which was scribed in my presence. The recorded information has been reviewed and is accurate.     Kem Parkinson, PA-C 08/14/15 1726    Milton Ferguson, MD 08/16/15 [phone]

## 2015-08-15 ENCOUNTER — Ambulatory Visit (HOSPITAL_COMMUNITY): Payer: Self-pay | Admitting: Hematology & Oncology

## 2015-08-15 ENCOUNTER — Encounter (HOSPITAL_COMMUNITY): Payer: Medicaid Other | Attending: Hematology & Oncology

## 2015-08-15 DIAGNOSIS — D500 Iron deficiency anemia secondary to blood loss (chronic): Secondary | ICD-10-CM

## 2015-08-15 LAB — CBC WITH DIFFERENTIAL/PLATELET
BASOS ABS: 0 10*3/uL (ref 0.0–0.1)
BASOS PCT: 0 %
EOS ABS: 0.2 10*3/uL (ref 0.0–0.7)
EOS PCT: 4 %
HCT: 36.6 % (ref 36.0–46.0)
Hemoglobin: 12 g/dL (ref 12.0–15.0)
Lymphocytes Relative: 24 %
Lymphs Abs: 1.3 10*3/uL (ref 0.7–4.0)
MCH: 29.3 pg (ref 26.0–34.0)
MCHC: 32.8 g/dL (ref 30.0–36.0)
MCV: 89.5 fL (ref 78.0–100.0)
MONO ABS: 0.4 10*3/uL (ref 0.1–1.0)
MONOS PCT: 7 %
NEUTROS ABS: 3.4 10*3/uL (ref 1.7–7.7)
Neutrophils Relative %: 65 %
PLATELETS: 267 10*3/uL (ref 150–400)
RBC: 4.09 MIL/uL (ref 3.87–5.11)
RDW: 15.1 % (ref 11.5–15.5)
WBC: 5.3 10*3/uL (ref 4.0–10.5)

## 2015-08-15 LAB — FERRITIN: FERRITIN: 25 ng/mL (ref 11–307)

## 2015-08-30 ENCOUNTER — Encounter: Payer: Self-pay | Admitting: Adult Health

## 2015-08-30 ENCOUNTER — Ambulatory Visit (INDEPENDENT_AMBULATORY_CARE_PROVIDER_SITE_OTHER): Payer: Medicaid Other | Admitting: Adult Health

## 2015-08-30 VITALS — BP 138/92 | HR 72 | Ht 69.0 in | Wt 348.2 lb

## 2015-08-30 DIAGNOSIS — Z86711 Personal history of pulmonary embolism: Secondary | ICD-10-CM

## 2015-08-30 DIAGNOSIS — Z0000 Encounter for general adult medical examination without abnormal findings: Secondary | ICD-10-CM

## 2015-08-30 DIAGNOSIS — Z01419 Encounter for gynecological examination (general) (routine) without abnormal findings: Secondary | ICD-10-CM

## 2015-08-30 DIAGNOSIS — Z86718 Personal history of other venous thrombosis and embolism: Secondary | ICD-10-CM

## 2015-08-30 DIAGNOSIS — E669 Obesity, unspecified: Secondary | ICD-10-CM

## 2015-08-30 DIAGNOSIS — I10 Essential (primary) hypertension: Secondary | ICD-10-CM

## 2015-08-30 DIAGNOSIS — Z3041 Encounter for surveillance of contraceptive pills: Secondary | ICD-10-CM

## 2015-08-30 DIAGNOSIS — D259 Leiomyoma of uterus, unspecified: Secondary | ICD-10-CM

## 2015-08-30 DIAGNOSIS — R109 Unspecified abdominal pain: Secondary | ICD-10-CM | POA: Insufficient documentation

## 2015-08-30 HISTORY — DX: Unspecified abdominal pain: R10.9

## 2015-08-30 MED ORDER — NORETHINDRONE 0.35 MG PO TABS: 1.0000 | ORAL_TABLET | Freq: Every day | ORAL | 11 refills | Status: DC

## 2015-08-30 NOTE — Patient Instructions (Signed)
Continue micronor Physical in 1 year, pap 2019  Mammogram at 41  Increase diary and advil with cramps

## 2015-08-30 NOTE — Progress Notes (Signed)
Patient ID: [name] [name], female   DOB: 1975-06-08, 40 y.o.   MRN: [id number] History of Present Illness: [name] [name] is a 40 year old black female in for a well woman gyn exam,she had a normal pap with negative HPV 07/21/14.She complains of cramping before her period, she is on micronor, and lovenox.She has history of DVT and PE, and she has IVC filter.  PCP is [name] [name] ,NP and she sees Dr [name] [name].   Current Medications, Allergies, Past Medical History, Past Surgical History, Family History and Social History were reviewed in [employer] record.     Review of Systems: Patient denies any headaches, hearing loss, fatigue, blurred vision, shortness of breath, chest pain,  problems with bowel movements, urination, or intercourse. No joint pain or mood swings. See HPI for positives.   Physical Exam:BP (!) 138/92 (BP Location: Left Arm, Patient Position: Sitting, Cuff Size: Large)   Pulse 72   Ht 5\' 9"  (1.753 m)   Wt (!) 348 lb 3.2 oz (157.9 kg)   LMP 08/03/2015 (Exact Date)   Breastfeeding? No   BMI 51.42 kg/m  General:  Well developed, well nourished, no acute distress Skin:  Warm and dry,has hidradenitis  Neck:  Midline trachea, normal thyroid, good ROM, no lymphadenopathy Lungs; Clear to auscultation bilaterally Breast:  No dominant palpable mass, retraction, or nipple discharge,they are large have areas of hidradenitis Cardiovascular: Regular rate and rhythm Abdomen:  Soft, non tender, no hepatosplenomegaly,obese Pelvic:  External genitalia is normal in appearance, no lesions.  The vagina is normal in appearance. Urethra has no lesions or masses. The cervix is bulbous.  Uterus is felt to be enlarged, has known fibroid.  No adnexal masses or tenderness noted.Bladder is non tender, no masses felt. Has hidradenitis between thighs Extremities/musculoskeletal:  No swelling or varicosities noted, no clubbing or cyanosis Psych:  No mood changes, alert and  cooperative,seems happy Discussed possible tubal and ablation or IUD, but she will just continue micronor for now and try increasing diary and use advil before period   Impression: Well woman gyn exam no pap Contraceptive management Cramps Hypertension Fibroids Obesity Hx of PE Hx of DVT    Plan: Refilled micronor x 1 year, disp 1 pack, take 1 daily at same time Increase diary, try advil before period Physical in 1 year, pap 2019 Mammogram at 41  Follow up with PCP about BP and meds

## 2015-09-06 ENCOUNTER — Other Ambulatory Visit (HOSPITAL_COMMUNITY): Payer: Self-pay | Admitting: Oncology

## 2015-09-06 DIAGNOSIS — I2602 Saddle embolus of pulmonary artery with acute cor pulmonale: Secondary | ICD-10-CM

## 2015-09-06 MED ORDER — OXYCODONE HCL 10 MG PO TABS: 10.0000 mg | ORAL_TABLET | Freq: Four times a day (QID) | ORAL | 0 refills | Status: DC | PRN

## 2015-09-10 ENCOUNTER — Other Ambulatory Visit (HOSPITAL_COMMUNITY): Payer: Self-pay | Admitting: Hematology & Oncology

## 2015-09-18 ENCOUNTER — Encounter (HOSPITAL_COMMUNITY): Payer: Medicaid Other | Attending: Hematology & Oncology

## 2015-09-18 VITALS — BP 139/76 | HR 72 | Temp 97.9°F | Resp 18

## 2015-09-18 DIAGNOSIS — D509 Iron deficiency anemia, unspecified: Secondary | ICD-10-CM

## 2015-09-18 DIAGNOSIS — D500 Iron deficiency anemia secondary to blood loss (chronic): Secondary | ICD-10-CM | POA: Insufficient documentation

## 2015-09-18 MED ORDER — SODIUM CHLORIDE 0.9 % IV SOLN
INTRAVENOUS | Status: DC
  Administered 2015-09-18: 15:00:00 via INTRAVENOUS

## 2015-09-18 MED ORDER — SODIUM CHLORIDE 0.9 % IV SOLN
510.0000 mg | Freq: Once | INTRAVENOUS | Status: AC
  Administered 2015-09-18: 510 mg via INTRAVENOUS
  Filled 2015-09-18: qty 17

## 2015-09-18 NOTE — Patient Instructions (Signed)
[facility] Discharge Instructions  RECOMMENDATIONS MADE BY THE CONSULTANT AND ANY TEST RESULTS WILL BE SENT TO YOUR REFERRING PHYSICIAN.  Received Feraheme today. Follow-up as scheduled. Call clinic for any questions or concerns  Thank you for choosing  [facility] Center at [facility] to provide your oncology and hematology care.  To afford each patient quality time with our provider, please arrive at least 15 minutes before your scheduled appointment time.   Beginning January 23rd 2017 lab work for the Cancer Center will be done in the  Main lab at [facility] on 1st floor. If you have a lab appointment with the Cancer Center please come in thru the  Main Entrance and check in at the main information desk  You need to re-schedule your appointment should you arrive 10 or more minutes late.  We strive to give you quality time with our providers, and arriving late affects you and other patients whose appointments are after yours.  Also, if you no show three or more times for appointments you may be dismissed from the clinic at the providers discretion.     Again, thank you for choosing [facility].  Our hope is that these requests will decrease the amount of time that you wait before being seen by our physicians.       _____________________________________________________________  Should you have questions after your visit to [facility], please contact our office at [phone] between the hours of 8:30 a.m. and 4:30 p.m.  Voicemails left after 4:30 p.m. will not be returned until the following business day.  For prescription refill requests, have your pharmacy contact our office.         Resources For Cancer Patients and their Caregivers ? American Cancer Society: Can assist with transportation, wigs, general needs, runs Look Good Feel Better.        1-888-227-6333 ? Cancer Care: Provides financial  assistance, online support groups, medication/co-pay assistance.  1-800-813-HOPE (4673) ? [facility] Assists [place] cancer patients and their families through emotional , educational and financial support.  336-427-4357 ? [place] DSS Where to apply for food stamps, Medicaid and utility assistance. 336-342-1394 ? RCATS: Transportation to medical appointments. 336-347-2287 ? Social Security Administration: May apply for disability if have a Stage IV cancer. 336-342-7796 1-800-772-1213 ? [place] Aging, Disability and Transit Services: Assists with nutrition, care and transit needs. 336-349-2343  Cancer Center Support Programs: @10RELATIVEDAYS@ > Cancer Support Group  2nd Tuesday of the month 1pm-2pm, Journey Room  > Creative Journey  3rd Tuesday of the month 1130am-1pm, Journey Room  > Look Good Feel Better  1st Wednesday of the month 10am-12 noon, Journey Room (Call American Cancer [employer] to register 1-800-395-5775)   

## 2015-09-18 NOTE — Progress Notes (Signed)
[name] [name] tolerated Feraheme well without complaints. Pt remained in clinic for 30 mins after infusion completed. VSS upon discharge. Pt discharged self ambulatory in satisfactory condition

## 2015-10-05 ENCOUNTER — Other Ambulatory Visit (HOSPITAL_COMMUNITY): Payer: Self-pay | Admitting: Oncology

## 2015-10-05 DIAGNOSIS — I2602 Saddle embolus of pulmonary artery with acute cor pulmonale: Secondary | ICD-10-CM

## 2015-10-05 MED ORDER — OXYCODONE HCL 10 MG PO TABS: 10.0000 mg | ORAL_TABLET | Freq: Four times a day (QID) | ORAL | 0 refills | Status: DC | PRN

## 2015-10-06 ENCOUNTER — Other Ambulatory Visit: Payer: Self-pay | Admitting: Women's Health

## 2015-10-06 ENCOUNTER — Other Ambulatory Visit (HOSPITAL_COMMUNITY): Payer: Self-pay | Admitting: Emergency Medicine

## 2015-10-06 DIAGNOSIS — I2602 Saddle embolus of pulmonary artery with acute cor pulmonale: Secondary | ICD-10-CM

## 2015-10-06 MED ORDER — OXYCODONE HCL 10 MG PO TABS: 10.0000 mg | ORAL_TABLET | Freq: Four times a day (QID) | ORAL | 0 refills | Status: DC | PRN

## 2015-11-01 ENCOUNTER — Other Ambulatory Visit (HOSPITAL_COMMUNITY): Payer: Self-pay | Admitting: Emergency Medicine

## 2015-11-01 DIAGNOSIS — I2602 Saddle embolus of pulmonary artery with acute cor pulmonale: Secondary | ICD-10-CM

## 2015-11-01 MED ORDER — OXYCODONE HCL 10 MG PO TABS: 10.0000 mg | ORAL_TABLET | Freq: Four times a day (QID) | ORAL | 0 refills | Status: DC | PRN

## 2015-11-01 NOTE — Progress Notes (Unsigned)
Oxycodone refilled.

## 2015-11-15 ENCOUNTER — Other Ambulatory Visit (HOSPITAL_COMMUNITY): Payer: Self-pay

## 2015-11-15 ENCOUNTER — Ambulatory Visit (HOSPITAL_COMMUNITY): Payer: Self-pay | Admitting: Hematology & Oncology

## 2015-11-27 ENCOUNTER — Telehealth (HOSPITAL_COMMUNITY): Payer: Self-pay | Admitting: *Deleted

## 2015-11-27 ENCOUNTER — Telehealth: Payer: Self-pay | Admitting: Emergency Medicine

## 2015-11-27 ENCOUNTER — Other Ambulatory Visit (HOSPITAL_COMMUNITY): Payer: Self-pay | Admitting: Oncology

## 2015-11-27 DIAGNOSIS — G4733 Obstructive sleep apnea (adult) (pediatric): Secondary | ICD-10-CM

## 2015-11-27 DIAGNOSIS — I2602 Saddle embolus of pulmonary artery with acute cor pulmonale: Secondary | ICD-10-CM

## 2015-11-27 MED ORDER — OXYCODONE HCL 10 MG PO TABS: 10.0000 mg | ORAL_TABLET | Freq: Four times a day (QID) | ORAL | 0 refills | Status: DC | PRN

## 2015-11-27 NOTE — Telephone Encounter (Signed)
Thanks

## 2015-11-27 NOTE — Telephone Encounter (Signed)
Printed

## 2015-11-27 NOTE — Telephone Encounter (Signed)
Pt calling to f/u on her sleep study as she had not heard anything about getting it scheduled.  Per 05/09/15 phone note, HST was ordered but was not covered by insurance. Per RB a spilt night was to be ordered. It  Does not look like this test was ordered.  Split night has been ordered and I have made pt aware of this. Advised pt to give [place] a call back in the night week if she has not heard from sleep center in regards to scheduling this test. Nothing further needed.   Will route to RB as a FYI

## 2015-12-01 ENCOUNTER — Other Ambulatory Visit (HOSPITAL_COMMUNITY): Payer: Self-pay

## 2015-12-01 ENCOUNTER — Ambulatory Visit (HOSPITAL_COMMUNITY): Payer: Self-pay | Admitting: Oncology

## 2015-12-12 ENCOUNTER — Encounter (HOSPITAL_COMMUNITY): Payer: Medicaid Other | Attending: Hematology & Oncology | Admitting: Oncology

## 2015-12-12 ENCOUNTER — Encounter (HOSPITAL_COMMUNITY): Payer: Medicaid Other

## 2015-12-12 ENCOUNTER — Encounter (HOSPITAL_COMMUNITY): Payer: Self-pay | Admitting: Oncology

## 2015-12-12 DIAGNOSIS — D500 Iron deficiency anemia secondary to blood loss (chronic): Secondary | ICD-10-CM | POA: Diagnosis not present

## 2015-12-12 DIAGNOSIS — Z86711 Personal history of pulmonary embolism: Secondary | ICD-10-CM | POA: Insufficient documentation

## 2015-12-12 DIAGNOSIS — Z7901 Long term (current) use of anticoagulants: Secondary | ICD-10-CM | POA: Diagnosis not present

## 2015-12-12 DIAGNOSIS — F17210 Nicotine dependence, cigarettes, uncomplicated: Secondary | ICD-10-CM | POA: Insufficient documentation

## 2015-12-12 DIAGNOSIS — Z9889 Other specified postprocedural states: Secondary | ICD-10-CM | POA: Insufficient documentation

## 2015-12-12 DIAGNOSIS — I2602 Saddle embolus of pulmonary artery with acute cor pulmonale: Secondary | ICD-10-CM | POA: Diagnosis present

## 2015-12-12 DIAGNOSIS — Z720 Tobacco use: Secondary | ICD-10-CM

## 2015-12-12 DIAGNOSIS — Z5189 Encounter for other specified aftercare: Secondary | ICD-10-CM | POA: Diagnosis not present

## 2015-12-12 LAB — CBC WITH DIFFERENTIAL/PLATELET
Basophils Absolute: 0 10*3/uL (ref 0.0–0.1)
Basophils Relative: 0 %
EOS PCT: 3 %
Eosinophils Absolute: 0.1 10*3/uL (ref 0.0–0.7)
HEMATOCRIT: 39.1 % (ref 36.0–46.0)
Hemoglobin: 12.7 g/dL (ref 12.0–15.0)
LYMPHS ABS: 1.2 10*3/uL (ref 0.7–4.0)
LYMPHS PCT: 24 %
MCH: 29.4 pg (ref 26.0–34.0)
MCHC: 32.5 g/dL (ref 30.0–36.0)
MCV: 90.5 fL (ref 78.0–100.0)
MONO ABS: 0.4 10*3/uL (ref 0.1–1.0)
MONOS PCT: 7 %
NEUTROS ABS: 3.3 10*3/uL (ref 1.7–7.7)
Neutrophils Relative %: 66 %
PLATELETS: 262 10*3/uL (ref 150–400)
RBC: 4.32 MIL/uL (ref 3.87–5.11)
RDW: 16.6 % — AB (ref 11.5–15.5)
WBC: 5.1 10*3/uL (ref 4.0–10.5)

## 2015-12-12 LAB — FERRITIN: Ferritin: 27 ng/mL (ref 11–307)

## 2015-12-12 NOTE — Assessment & Plan Note (Signed)
Iron deficiency secondary to heavy menses in the setting of anticoagulation having required IV iron replacement therapy.  Labs every 3 months: CBC, ferritin.

## 2015-12-12 NOTE — Patient Instructions (Signed)
[name] [name] at [facility] Discharge Instructions  RECOMMENDATIONS MADE BY THE CONSULTANT AND ANY TEST RESULTS WILL BE SENT TO YOUR REFERRING PHYSICIAN.  You were seen today by Kirby Crigler PA-C.  Continue taking Lovenox daily. Labs in 3 months and 6 months. Return in 6 months for follow up.   Thank you for choosing [place] at [facility] to provide your oncology and hematology care.  To afford each patient quality time with our provider, please arrive at least 15 minutes before your scheduled appointment time.   Beginning January 23rd 2017 lab work for the [facility] will be done in the  Main lab at [facility] on 1st floor. If you have a lab appointment with the [place] please come in thru the  Main Entrance and check in at the main information desk  You need to re-schedule your appointment should you arrive 10 or more minutes late.  We strive to give you quality time with our providers, and arriving late affects you and other patients whose appointments are after yours.  Also, if you no show three or more times for appointments you may be dismissed from the clinic at the providers discretion.     Again, thank you for choosing [facility].  Our hope is that these requests will decrease the amount of time that you wait before being seen by our physicians.       _____________________________________________________________  Should you have questions after your visit to [facility], please contact our office at [phone] between the hours of 8:30 a.m. and 4:30 p.m.  Voicemails left after 4:30 p.m. will not be returned until the following business day.  For prescription refill requests, have your pharmacy contact our office.         Resources For Cancer Patients and their Caregivers ? American Cancer Society: Can assist with transportation, wigs, general needs, runs Look Good Feel Better.         424 777 4682 ? Cancer Care: Provides financial assistance, online support groups, medication/co-pay assistance.  1-800-813-HOPE 5155467329) ? [facility] Assists [place] cancer patients and their families through emotional , educational and financial support.  310-679-6308 ? [place] DSS Where to apply for food stamps, Medicaid and utility assistance. 606-793-4289 ? RCATS: Transportation to medical appointments. 860-645-6895 ? Social Security Administration: May apply for disability if have a Stage IV cancer. (321)128-4820 762-002-1887 ? [employer], Disability and Transit Services: Assists with nutrition, care and transit needs. [place] Support Programs: @10RELATIVEDAYS @ > Cancer Support Group  2nd Tuesday of the month 1pm-2pm, Journey Room  > Creative Journey  3rd Tuesday of the month 1130am-1pm, Journey Room  > Look Good Feel Better  1st Wednesday of the month 10am-12 noon, Journey Room (Call [place] to register [phone])

## 2015-12-12 NOTE — Assessment & Plan Note (Addendum)
History of PE with an IVC filter placed on 04/29/2013 with a history of saddle embolism on CT imaging on 04/27/2013 and 2 miscarriages at 9 and 6 weeks, currently on Lovenox anticoagulation.  She has seen [facility] Coagulation clinic in March 2017 for recommendations which included lifelong anticoagulation.  Labs today: CBC, ferritin.  I personally reviewed and went over laboratory results with the patient.  The results are noted within this dictation.  Labs every 3 months: CBC, ferritin  She wants to switch to an oral agent for anticoagulation.  Per [facility]: Patient is interested in changing back to an oral anticoagulant. As before, do not recommend Xarelto or any of the DOACs at this point given the lack of data with their use in individuals weighing > 120 kg. Therefore, the preferred oral anticoagulant would be warfarin. Patient will need to ensure she has a way to have INRs monitored, probably with PCP, prior to making this change. She can start warfarin and overlap with Lovenox until INR is in the therapeutic range. We may have better data on the DOACs in obesity in the upcoming years, so can likely make the change to a DOAC at some point in the future.   She does not want to do Coumadin at this time due to restrictions and lab tests associated with this medication.  Therefore, we will continue with Lovenox.  Return in 6 months for follow-up.

## 2015-12-12 NOTE — Progress Notes (Signed)
[name] [name], [name] [name]  Acute saddle pulmonary embolism with acute cor pulmonale (HCC)  Iron deficiency anemia due to chronic blood loss  CURRENT THERAPY: Lovenox  INTERVAL HISTORY: [name] [name] 40 y.o. female returns for followup of her history of PE. She is also followed for iron deficiency anemia. She continues on lovenox. She is doing well. She was seen at [facility] with recommendations as follows:  Patient Instructions - [name], MD - 04/18/2015 9:20 AM EDT You should continue on blood thinners long term due to your previous unprovoked clots, especially given the severe nature of your lung clots. Similar to my recommendations at your initial visit, I would not recommend transitioning to one of the new oral anticoagulants like Xarelto at this time. These medications have not been studied well in individuals with weights > ~250 pounds, so we don't know how effective they are in this population. As such, if you wish to change from the Lovenox shots to an oral medication, I would recommend warfarin or Coumadin. You would start this and overlap it with Lovenox until your Coumadin levels are in the therapeutic range. This therapeutic range is an INR of 2-3. You will need to have someone locally to follow your INR levels and adjust Coumadin doses as needed. This is typically done by a primary care doctor, although Dr. Whitney Muse may be able to assist as well. You should be mindful of having stable intake of vitamin K containing foods while on Coumadin. You do not need to eliminate them from your diet but just eat a similar amount every week. Otherwise it will make keeping your Coumadin levels in the therapeutic range of difficult task. I anticipate having data in the next few years on the use of the new oral anticoagulants at the extremes of the body weight spectrum, so likely we can transitioning to one of these medicines in the  future.  Regarding your left leg pain symptoms, I imagine it is due to something called post-thrombotic syndrome. The main treatment for this is to wear compression stockings. Try the over-the-counter type first and if no improvement then you can fill the prescription provided today for medical grade stockings. If ultimately you cannot tolerate the stockings or they do not improve symptoms significantly, then there is no need to continue wearing them. I recommend that you avoid any estrogen containing systemic hormonal preparations. The safest hormonal option for birth control is the [place] or [name]. The next safest options are the progesterone only pills or other progesterone only preparations. Taking them while on anticoagulation is likely safe, but again I would recommend avoiding estrogen containing preparations. If you were to become pregnant again in the future, you would need to be treated with Lovenox throughout the pregnancy like before.  For now, lets plan to see back here every 1-2 years to reassess the decision for long-term blood thinners and if there are alternative options available to you. I'm happy to see her sooner if new questions or problems.   She is tolerating lovenox well.  She denies any blood in her stool or dark stools.  She is interested in changing to an oral agent and therefore, based upon [facility] recommendations, we discussed this option.  She does not want to pursue Vitamin K antagonist therapy due to restrictions and recurrent blood tests.  Review of Systems  Constitutional: Negative.  Negative for chills, fever and weight loss.  HENT: Negative.  Negative for nosebleeds.   Eyes: Negative.   Respiratory: Negative.  Negative for cough and hemoptysis.   Cardiovascular: Negative.  Negative for chest pain.  Gastrointestinal: Negative.  Negative for blood in stool and melena.  Genitourinary: Negative.  Negative for hematuria.  Musculoskeletal: Positive for back pain.   Skin: Negative.   Neurological: Negative.  Negative for weakness.  Endo/Heme/Allergies: Bruises/bleeds easily.  Psychiatric/Behavioral: Negative.     Past Medical History:  Diagnosis Date  . Abdominal cramps 08/30/2015  . Closed left ankle fracture    August 30 2012  . DVT (deep venous thrombosis) (HCC)    L leg  . Fibroid tumor   . Hidradenitis   . High blood pressure   . Hypertension   . Obesity   . OSA on CPAP   . Pregnancy    09/20/14 17 weeks  . Pulmonary embolism ([place]) 04/2013  . Tobacco use disorder     Past Surgical History:  Procedure Laterality Date  . ABSCESS DRAINAGE Bilateral 01/10/15  . CYST REMOVAL TRUNK    . IVC FILTER PLACEMENT ([facility] HX)      Family History  Problem Relation Age of Onset  . Hypertension Mother   . Diabetes Paternal Aunt   . Diabetes Paternal Uncle   . Other Father     blood clots  . Cancer Maternal Grandmother   . Heart attack Paternal Grandmother     Social History   Social History  . Marital status: Widowed    Spouse name: N/A  . Number of children: N/A  . Years of education: N/A   Social History Main Topics  . Smoking status: Current Some Day Smoker    Packs/day: 0.00    Years: 18.00    Types: Cigarettes  . Smokeless tobacco: Never Used     Comment: smokes 3 cig when she smokes  . Alcohol use No  . Drug use: No  . Sexual activity: Yes    Birth control/ protection: Pill   Other Topics Concern  . None   Social History Narrative  . None     PHYSICAL EXAMINATION  ECOG PERFORMANCE STATUS: 1 - Symptomatic but completely ambulatory  Vitals:   12/12/15 1106  BP: (!) 144/80  Pulse: 79  Resp: 16  Temp: 98.6 F (37 C)    GENERAL:alert, no distress, well nourished, well developed, comfortable, cooperative, obese, smiling and unaccompanied SKIN: skin color, texture, turgor are normal, no rashes or significant lesions HEAD: Normocephalic, No masses, lesions, tenderness or abnormalities EYES: normal, EOMI,  Conjunctiva are pink and non-injected EARS: External ears normal OROPHARYNX:lips, buccal mucosa, and tongue normal and mucous membranes are moist  NECK: supple, trachea midline LYMPH:  no palpable lymphadenopathy BREAST:not examined LUNGS: clear to auscultation  HEART: regular rate & rhythm ABDOMEN:abdomen soft and obese BACK: Back symmetric, no curvature. EXTREMITIES:less then 2 second capillary refill, no joint deformities, effusion, or inflammation, no skin discoloration  NEURO: alert & oriented x 3 with fluent speech   LABORATORY DATA: CBC    Component Value Date/Time   WBC 5.1 12/12/2015 1032   RBC 4.32 12/12/2015 1032   HGB 12.7 12/12/2015 1032   HCT 39.1 12/12/2015 1032   HCT 37.7 01/18/2015 1653   PLT 262 12/12/2015 1032   PLT 214 01/18/2015 1653   MCV 90.5 12/12/2015 1032   MCV 95 01/18/2015 1653   MCH 29.4 12/12/2015 1032   MCHC 32.5 12/12/2015 1032   RDW 16.6 (H) 12/12/2015 1032  RDW 14.3 01/18/2015 1653   LYMPHSABS 1.2 12/12/2015 1032   MONOABS 0.4 12/12/2015 1032   EOSABS 0.1 12/12/2015 1032   BASOSABS 0.0 12/12/2015 1032      Chemistry      Component Value Date/Time   NA 137 04/03/2015 1420   NA 139 01/18/2015 1653   K 4.3 04/03/2015 1420   CL 102 04/03/2015 1420   CO2 29 04/03/2015 1420   BUN 19 04/03/2015 1420   BUN 9 01/18/2015 1653   CREATININE 1.06 (H) 04/03/2015 1420      Component Value Date/Time   CALCIUM 8.9 04/03/2015 1420   ALKPHOS 57 04/03/2015 1420   AST 13 (L) 04/03/2015 1420   ALT 11 (L) 04/03/2015 1420   BILITOT 0.5 04/03/2015 1420   BILITOT <0.2 01/18/2015 1653        PENDING LABS:   RADIOGRAPHIC STUDIES:  No results found.   PATHOLOGY:    ASSESSMENT AND PLAN:  Saddle embolus of pulmonary artery with acute cor pulmonale History of PE with an IVC filter placed on 04/29/2013 with a history of saddle embolism on CT imaging on 04/27/2013 and 2 miscarriages at 9 and 6 weeks, currently on Lovenox anticoagulation.  She has  seen [facility] in March 2017 for recommendations which included lifelong anticoagulation.  Labs today: CBC, ferritin.  I personally reviewed and went over laboratory results with the patient.  The results are noted within this dictation.  Labs every 3 months: CBC, ferritin  She wants to switch to an oral agent for anticoagulation.  Per [facility]: Patient is interested in changing back to an oral anticoagulant. As before, do not recommend Xarelto or any of the DOACs at this point given the lack of data with their use in individuals weighing > 120 kg. Therefore, the preferred oral anticoagulant would be warfarin. Patient will need to ensure she has a way to have INRs monitored, probably with PCP, prior to making this change. She can start warfarin and overlap with Lovenox until INR is in the therapeutic range. We may have better data on the DOACs in obesity in the upcoming years, so can likely make the change to a DOAC at some point in the future.   She does not want to do Coumadin at this time due to restrictions and lab tests associated with this medication.  Therefore, we will continue with Lovenox.  Return in 6 months for follow-up.  Iron deficiency anemia due to chronic blood loss Iron deficiency secondary to heavy menses in the setting of anticoagulation having required IV iron replacement therapy.  Labs every 3 months: CBC, ferritin.   ORDERS PLACED FOR THIS ENCOUNTER: No orders of the defined types were placed in this encounter.   MEDICATIONS PRESCRIBED THIS ENCOUNTER: No orders of the defined types were placed in this encounter.   THERAPY PLAN:  Continue anticoagulation; current recommendation is lifelong.  We will monitor iron studies and replace iron IV when indicated.  All questions were answered. The patient knows to call the clinic with any problems, questions or concerns. We can certainly see the patient much sooner if necessary.  Patient and plan discussed with  Dr. [name] and she is in agreement with the aforementioned.   This note is electronically signed by: Doy Mince 12/12/2015 11:39 AM

## 2015-12-13 ENCOUNTER — Other Ambulatory Visit (HOSPITAL_COMMUNITY): Payer: Self-pay | Admitting: Hematology & Oncology

## 2015-12-21 ENCOUNTER — Ambulatory Visit: Payer: Medicaid Other | Attending: Emergency Medicine | Admitting: Neurology

## 2015-12-21 DIAGNOSIS — G4733 Obstructive sleep apnea (adult) (pediatric): Secondary | ICD-10-CM | POA: Diagnosis not present

## 2015-12-22 ENCOUNTER — Telehealth (HOSPITAL_COMMUNITY): Payer: Self-pay | Admitting: *Deleted

## 2015-12-22 ENCOUNTER — Encounter (HOSPITAL_COMMUNITY): Payer: Medicaid Other | Attending: Hematology & Oncology

## 2015-12-22 ENCOUNTER — Other Ambulatory Visit (HOSPITAL_COMMUNITY): Payer: Self-pay | Admitting: Oncology

## 2015-12-22 VITALS — BP 128/99 | HR 84 | Temp 99.3°F | Resp 18

## 2015-12-22 DIAGNOSIS — D508 Other iron deficiency anemias: Secondary | ICD-10-CM

## 2015-12-22 DIAGNOSIS — D500 Iron deficiency anemia secondary to blood loss (chronic): Secondary | ICD-10-CM

## 2015-12-22 DIAGNOSIS — I2602 Saddle embolus of pulmonary artery with acute cor pulmonale: Secondary | ICD-10-CM

## 2015-12-22 DIAGNOSIS — Z9889 Other specified postprocedural states: Secondary | ICD-10-CM | POA: Insufficient documentation

## 2015-12-22 DIAGNOSIS — Z5189 Encounter for other specified aftercare: Secondary | ICD-10-CM | POA: Insufficient documentation

## 2015-12-22 DIAGNOSIS — F17210 Nicotine dependence, cigarettes, uncomplicated: Secondary | ICD-10-CM | POA: Insufficient documentation

## 2015-12-22 DIAGNOSIS — Z86711 Personal history of pulmonary embolism: Secondary | ICD-10-CM | POA: Insufficient documentation

## 2015-12-22 MED ORDER — OXYCODONE HCL 10 MG PO TABS: 10.0000 mg | ORAL_TABLET | Freq: Four times a day (QID) | ORAL | 0 refills | Status: DC | PRN

## 2015-12-22 MED ORDER — SODIUM CHLORIDE 0.9 % IV SOLN
510.0000 mg | Freq: Once | INTRAVENOUS | Status: AC
  Administered 2015-12-22: 510 mg via INTRAVENOUS
  Filled 2015-12-22: qty 17

## 2015-12-22 MED ORDER — ACETAMINOPHEN 325 MG PO TABS
ORAL_TABLET | ORAL | Status: AC
  Filled 2015-12-22: qty 2

## 2015-12-22 MED ORDER — DIPHENHYDRAMINE HCL 25 MG PO CAPS
25.0000 mg | ORAL_CAPSULE | Freq: Once | ORAL | Status: AC
  Administered 2015-12-22: 25 mg via ORAL

## 2015-12-22 MED ORDER — DIPHENHYDRAMINE HCL 25 MG PO CAPS
ORAL_CAPSULE | ORAL | Status: AC
  Filled 2015-12-22: qty 1

## 2015-12-22 MED ORDER — SODIUM CHLORIDE 0.9 % IV SOLN
INTRAVENOUS | Status: DC
  Administered 2015-12-22: 15:00:00 via INTRAVENOUS

## 2015-12-22 MED ORDER — ACETAMINOPHEN 325 MG PO TABS
650.0000 mg | ORAL_TABLET | Freq: Once | ORAL | Status: AC
  Administered 2015-12-22: 650 mg via ORAL

## 2016-01-06 NOTE — Procedures (Signed)
[place] A. Merlene Laughter, MD     [email or web address]             NOCTURNAL POLYSOMNOGRAPHY   LOCATION: [facility]   Patient Name: [name] [name], [name] [name] Date: 12/21/2015 Gender: Female D.O.B: 1975/04/14 Age (years): 40 Referring Provider: Baltazar Apo Height (inches): 70 Interpreting Physician: Phillips Odor MD, ABSM Weight (lbs): 343 RPSGT: Peak, Robert BMI: 49 MRN: [id number] Neck Size: 16.00 CLINICAL INFORMATION Sleep Study Type: Split Night CPAP  Indication for sleep study: N/A  Epworth Sleepiness Score:  SLEEP STUDY TECHNIQUE As per the AASM Manual for the Scoring of Sleep and Associated Events v2.3 (April 2016) with a hypopnea requiring 4% desaturations.  The channels recorded and monitored were frontal, central and occipital EEG, electrooculogram (EOG), submentalis EMG (chin), nasal and oral airflow, thoracic and abdominal wall motion, anterior tibialis EMG, snore microphone, electrocardiogram, and pulse oximetry. Continuous positive airway pressure (CPAP) was initiated when the patient met split night criteria and was titrated according to treat sleep-disordered breathing.  MEDICATIONS Medications self-administered by patient taken the night of the study : N/A  Current Outpatient Prescriptions:  .  amLODipine (NORVASC) 5 MG tablet, Take 0.5 tablets (2.5 mg total) by mouth daily. (Patient taking differently: Take 5 mg by mouth daily. ), Disp: 30 tablet, Rfl: 11 .  enoxaparin (LOVENOX) 100 MG/ML injection, Inject 1.7 mLs (170 mg total) into the skin every 12 (twelve) hours., Disp: 60 Syringe, Rfl: 5 .  Ibuprofen (MIDOL PO), Take by mouth as needed., Disp: , Rfl:  .  norethindrone (MICRONOR,CAMILA,ERRIN) 0.35 MG tablet, Take 1 tablet (0.35 mg total) by mouth daily. Take 1 a day, Disp: 1 Package, Rfl: 11 .  Oxycodone HCl 10 MG TABS, Take 1 tablet (10 mg total) by mouth every 6 (six) hours as needed., Disp: 45 tablet, Rfl: 0 .   triamterene-hydrochlorothiazide (MAXZIDE-25) 37.5-25 MG tablet, Take 1 tablet by mouth daily., Disp: 30 tablet, Rfl: 11 No current facility-administered medications for this visit.   Facility-Administered Medications Ordered in Other Visits:  .  0.9 %  sodium chloride infusion, , Intravenous, Continuous, Baird Cancer, PA-C, Last Rate: 20 mL/hr at 12/22/15 1430   RESPIRATORY PARAMETERS Diagnostic  Total AHI (/hr): 77.5 RDI (/hr): 81.3 OA Index (/hr): 11.3 CA Index (/hr): 0.0 REM AHI (/hr): N/A NREM AHI (/hr): 77.5 Supine AHI (/hr): N/A Non-supine AHI (/hr): 77.54 Min O2 Sat (%): 72.00 Mean O2 (%): 89.86 Time below 88% (min): 51.1   Titration  Optimal Pressure (cm): 11 AHI at Optimal Pressure (/hr): 1.5 Min O2 at Optimal Pressure (%): 90.0 Supine % at Optimal (%): 0 Sleep % at Optimal (%): 93   SLEEP ARCHITECTURE The recording time for the entire night was 449.4 minutes.  During a baseline period of 194.8 minutes, the patient slept for 143.9 minutes in REM and nonREM, yielding a sleep efficiency of 73.9%. Sleep onset after lights out was 40.3 minutes with a REM latency of N/A minutes. The patient spent 26.35% of the night in stage N1 sleep, 73.65% in stage N2 sleep, 0.00% in stage N3 and 0.00% in REM.  During the titration period of 251.7 minutes, the patient slept for 237.5 minutes in REM and nonREM, yielding a sleep efficiency of 94.4%. Sleep onset after CPAP initiation was 3.7 minutes with a REM latency of 27.0 minutes. The patient spent 2.95% of the night in stage N1 sleep, 66.95% in stage N2 sleep, 0.00% in stage N3 and 30.10% in REM.  CARDIAC DATA The 2 lead EKG  demonstrated sinus rhythm. The mean heart rate was 68.96 beats per minute. Other EKG findings include: None. LEG MOVEMENT DATA The total Periodic Limb Movements of Sleep (PLMS) were 0. The PLMS index was 0.00.  IMPRESSIONS - Severe obstructive sleep apnea occurred during the diagnostic portion of the study (AHI =  77.5/hour). An optimal CPAP pressure was selected for this patient ( 11 cm of water) - Absent slow wave sleep is observed.   Delano Metz, MD Diplomate, American Board of Sleep Medicine.

## 2016-01-23 ENCOUNTER — Other Ambulatory Visit (HOSPITAL_COMMUNITY): Payer: Self-pay | Admitting: Oncology

## 2016-01-23 ENCOUNTER — Telehealth (HOSPITAL_COMMUNITY): Payer: Self-pay | Admitting: *Deleted

## 2016-01-23 DIAGNOSIS — I2602 Saddle embolus of pulmonary artery with acute cor pulmonale: Secondary | ICD-10-CM

## 2016-01-23 MED ORDER — OXYCODONE HCL 10 MG PO TABS: 10.0000 mg | ORAL_TABLET | Freq: Four times a day (QID) | ORAL | 0 refills | Status: DC | PRN

## 2016-01-23 NOTE — Telephone Encounter (Signed)
Printed.  TK 

## 2016-02-01 ENCOUNTER — Other Ambulatory Visit (HOSPITAL_COMMUNITY): Payer: Self-pay | Admitting: Oncology

## 2016-02-02 ENCOUNTER — Telehealth (HOSPITAL_COMMUNITY): Payer: Self-pay | Admitting: *Deleted

## 2016-02-02 NOTE — Telephone Encounter (Signed)
-----   Message from Baird Cancer, PA-C sent at 02/01/2016  6:36 PM EST ----- Insurance has denied Oxycodone.  Please let her know that she can take OTC tylenol, advil, aleve, etc.  TK

## 2016-02-21 ENCOUNTER — Telehealth (HOSPITAL_COMMUNITY): Payer: Self-pay | Admitting: *Deleted

## 2016-02-21 ENCOUNTER — Other Ambulatory Visit (HOSPITAL_COMMUNITY): Payer: Self-pay

## 2016-02-21 DIAGNOSIS — I2602 Saddle embolus of pulmonary artery with acute cor pulmonale: Secondary | ICD-10-CM

## 2016-02-21 DIAGNOSIS — R0781 Pleurodynia: Secondary | ICD-10-CM

## 2016-02-21 DIAGNOSIS — M5410 Radiculopathy, site unspecified: Secondary | ICD-10-CM

## 2016-02-21 MED ORDER — OXYCODONE HCL 10 MG PO TABS: 10.0000 mg | ORAL_TABLET | Freq: Four times a day (QID) | ORAL | 0 refills | Status: DC | PRN

## 2016-02-21 NOTE — Telephone Encounter (Signed)
It got denied by her insurance.  Does she still want the Rx?  I do not know what her cost will be.  TK

## 2016-02-21 NOTE — Telephone Encounter (Signed)
Patient states she had to pay for it last month and will this month also.

## 2016-03-09 ENCOUNTER — Other Ambulatory Visit: Payer: Self-pay | Admitting: Obstetrics & Gynecology

## 2016-03-11 ENCOUNTER — Ambulatory Visit (INDEPENDENT_AMBULATORY_CARE_PROVIDER_SITE_OTHER): Payer: Medicaid Other | Admitting: Emergency Medicine

## 2016-03-11 ENCOUNTER — Encounter: Payer: Self-pay | Admitting: Emergency Medicine

## 2016-03-11 DIAGNOSIS — F17200 Nicotine dependence, unspecified, uncomplicated: Secondary | ICD-10-CM | POA: Diagnosis not present

## 2016-03-11 DIAGNOSIS — I2602 Saddle embolus of pulmonary artery with acute cor pulmonale: Secondary | ICD-10-CM

## 2016-03-11 DIAGNOSIS — G4733 Obstructive sleep apnea (adult) (pediatric): Secondary | ICD-10-CM | POA: Diagnosis not present

## 2016-03-11 NOTE — Assessment & Plan Note (Signed)
With an IVC filter in place. She has been changed to anoxic apparent. The plan is for her to stay on anticoagulation lifelong if tolerated.

## 2016-03-11 NOTE — Addendum Note (Signed)
Addended by: Len Blalock on: 03/11/2016 03:54 PM   Modules accepted: Orders

## 2016-03-11 NOTE — Progress Notes (Signed)
Subjective:    Patient ID: [name] [name], female    DOB: Jun 25, 1975, 41 y.o.   MRN: [id number]  HPI 41 yo smoker, HTN, obesity, recent admission for PE with associated PAH and RV dilation. She is being treated with xarelto.  She has a hx of dyspnea even preceding the PE. She is a bit better, but still some with exertion.  She snores, has some witnessed apneas and jerks herself awake. She has L>R LE edema. No cough or wheeze.   ROV 09/10/13 -- 41 yo woman, hx PE in 04/2013, OSA, secondary PAH. PSG > shows moderate OSA that responded to CPAP 9cm H2O. She went to have IVCF extracted but there was clot in the filter so they left it in. It will likely be permanent. She has needed O2 with exertion, breathing has been stable.   ROV 12/21/13 -- follow-up visit for secondary pulmonary hypertension in the setting of pulmonary embolism and obstructive sleep apnea. She has a permanent IVC filter. We ordered CPAP mask, doesn't have it yet - trying to get it through an assistance program but not yet done. She is getting her xarelto free until April. She has a dry cough, bothers her more at night. She has occasional reflux.  We have not repeated her TTE yet.   ROV 06/29/14 -- follow-up visit for history of pulmonary embolism and obstructive sleep apnea resulting in secondary pulmonary hypertension. She has an IVC filter in place, was not removed. She has not yet bee able to start CPAP, she needs to be able to pay for it. She's been doing fairly well. She does still have some occasional exertional dyspnea. She does not have significant chest pain. She does still get some left lower extremity pain especially with exertion. She remains on Xarelto and has tolerated this well  ROV 05/08/15 -- patient follows up today for history of pulmonary embolism as well as obstructive sleep apnea. She has an IVC filter in place that was not retrieved. At our last visit we discussed lifelong anticoagulation. She had a child since last  visit in Feb. No dyspnea, no reported issues w PAH. She had stopped smoking but has restarted now that she quit smoking. She also does not yet have CPAP but she does now have insurance.   ROV 03/11/16 -- this is a follow-up visit for patient with a history of pulmonary embolism, obstructive sleep apnea. Also with a history of tobacco abuse. She underwent a split-night sleep study on 12/21/15. This study showed severe obstructive sleep apnea with an AHI 77.5 per hour. Optimal CPAP therapy was 11 cm water. She is now on enoxaparin. No complaints. She is enthusiastic about starting CPAp therapy.    Review of Systems  Constitutional: Negative for fever and unexpected weight change.  HENT: Negative for congestion, dental problem, ear pain, nosebleeds, postnasal drip, rhinorrhea, sinus pressure, sneezing, sore throat and trouble swallowing.   Eyes: Negative for redness and itching.  Respiratory: Negative for cough, chest tightness, shortness of breath and wheezing.   Cardiovascular: Negative for chest pain, palpitations and leg swelling.  Gastrointestinal: Negative for abdominal distention, nausea and vomiting.  Genitourinary: Negative for dysuria.  Musculoskeletal: Negative for joint swelling.  Skin: Negative for rash.  Hematological: Does not bruise/bleed easily.  Psychiatric/Behavioral: Negative for dysphoric mood. The patient is not nervous/anxious.       Objective:   Physical Exam Vitals:   03/11/16 1517  BP: 128/72  BP Location: Right Arm  Cuff Size: Large  Pulse: 93  SpO2: 100%  Weight: (!) 359 lb (162.8 kg)  Height: 5\' 10"  (1.778 m)   Gen: Pleasant, obese woman, in no distress,  normal affect  ENT: No lesions,  mouth clear,  oropharynx clear, no postnasal drip  Neck: No JVD, no TMG, no carotid bruits  Lungs: No use of accessory muscles, clear without rales or rhonchi  Cardiovascular: RRR, heart sounds normal, no murmur or gallops, no peripheral edema  Musculoskeletal: No  deformities, no cyanosis or clubbing, no calf tenderness  Neuro: alert, non focal  Skin: Warm, no lesions or rashes    04/27/13 --  COMPARISON: None.  FINDINGS:  A large saddle embolus is appreciated. Extending from the distal  main pulmonary artery and to the left and right pulmonary arteries  with extension into the left upper lobe, left lower lobe and lingula  branches. There is right upper lobe, right middle lobe and right  lower lobe extension. Areas of subsegmental extension appreciated.  RV LV ratio 1.65. There are areas of intraventricular septal bowing  to the right.  The thoracic inlet is unremarkable.  There is no evidence of mediastinal masses or adenopathy.  The lungs are clear.  Visualized upper abdominal viscera are unremarkable.  Review of the MIP images confirms the above findings.  IMPRESSION:  Positive for acute PE with CTevidence of right heart strain (RV/LV  Ratio = 1.65 ) consistent with at least submassive (intermediate  risk) PE. The presence of right heart strain has been associated  with an increased risk of morbidity and mortality.    04/28/13 --  Study Conclusions - Study data: Technically difficult study. - Procedure narrative: Transthoracic echocardiography. Image quality was adequate. The study was technically difficult, as a result of body habitus. - Left ventricle: The cavity size was normal. Wall thickness was increased in a pattern of moderate LVH. Systolic function was vigorous. The estimated ejection fraction was in the range of 65% to 70%. Wall motion was normal; there were no regional wall motion abnormalities. Doppler parameters are consistent with abnormal left ventricular relaxation (grade 1 diastolic dysfunction). - Aortic valve: Valve area: 2.01cm^2(VTI). Valve area: 2.11cm^2 (Vmax). - Right ventricle: There is systolic flattening of the interventricular septum consistent with RV pressure overload. The cavity size was severely  dilated. Systolic function was mildly reduced. - Pulmonary arteries: Systolic pressure was moderately increased. PA peak pressure: 93mm Hg (S).      Assessment & Plan:  Obstructive sleep apnea Documented on sleep study from 12/21/15. Based on those results we will start CPAP at 11 7 m water. She will return in 2 months to discuss tolerance and compliance.  Saddle embolus of pulmonary artery with acute cor pulmonale With an IVC filter in place. She has been changed to anoxic apparent. The plan is for her to stay on anticoagulation lifelong if tolerated.  Tobacco use disorder We discussed cessation today.  Baltazar Apo, MD, PhD 03/11/2016, 3:51 PM [facility] [phone] or if no answer [phone]

## 2016-03-11 NOTE — Assessment & Plan Note (Signed)
We discussed cessation today.

## 2016-03-11 NOTE — Assessment & Plan Note (Signed)
Documented on sleep study from 12/21/15. Based on those results we will start CPAP at 11 7 m water. She will return in 2 months to discuss tolerance and compliance.

## 2016-03-11 NOTE — Patient Instructions (Signed)
We will order CPAP 11 cm H2O pressure, mask of choice / best fit, with heated humidity through [employer] in [place].  Continue your enoxaparin as you are taking it.  Congratulations on decreasing your smoking. We will continue to work on stopping altogether.  Follow with Dr Lamonte Sakai in 2 months to discuss your status on the CPAP

## 2016-03-13 ENCOUNTER — Encounter (HOSPITAL_COMMUNITY): Payer: Medicaid Other | Attending: Oncology

## 2016-03-13 DIAGNOSIS — D500 Iron deficiency anemia secondary to blood loss (chronic): Secondary | ICD-10-CM | POA: Diagnosis present

## 2016-03-13 LAB — CBC WITH DIFFERENTIAL/PLATELET
BASOS PCT: 0 %
Basophils Absolute: 0 10*3/uL (ref 0.0–0.1)
EOS PCT: 2 %
Eosinophils Absolute: 0.2 10*3/uL (ref 0.0–0.7)
HEMATOCRIT: 38.9 % (ref 36.0–46.0)
Hemoglobin: 12.6 g/dL (ref 12.0–15.0)
LYMPHS PCT: 14 %
Lymphs Abs: 1.1 10*3/uL (ref 0.7–4.0)
MCH: 28.7 pg (ref 26.0–34.0)
MCHC: 32.4 g/dL (ref 30.0–36.0)
MCV: 88.6 fL (ref 78.0–100.0)
MONO ABS: 0.5 10*3/uL (ref 0.1–1.0)
MONOS PCT: 6 %
Neutro Abs: 6.4 10*3/uL (ref 1.7–7.7)
Neutrophils Relative %: 78 %
PLATELETS: 330 10*3/uL (ref 150–400)
RBC: 4.39 MIL/uL (ref 3.87–5.11)
RDW: 14.6 % (ref 11.5–15.5)
WBC: 8.2 10*3/uL (ref 4.0–10.5)

## 2016-03-13 LAB — FERRITIN: Ferritin: 60 ng/mL (ref 11–307)

## 2016-03-20 ENCOUNTER — Other Ambulatory Visit (HOSPITAL_COMMUNITY): Payer: Self-pay | Admitting: Oncology

## 2016-03-20 ENCOUNTER — Telehealth (HOSPITAL_COMMUNITY): Payer: Self-pay | Admitting: *Deleted

## 2016-03-20 DIAGNOSIS — I2602 Saddle embolus of pulmonary artery with acute cor pulmonale: Secondary | ICD-10-CM

## 2016-03-20 DIAGNOSIS — R0781 Pleurodynia: Secondary | ICD-10-CM

## 2016-03-20 DIAGNOSIS — M5410 Radiculopathy, site unspecified: Secondary | ICD-10-CM

## 2016-03-20 MED ORDER — OXYCODONE HCL 10 MG PO TABS: 10.0000 mg | ORAL_TABLET | Freq: Four times a day (QID) | ORAL | 0 refills | Status: DC | PRN

## 2016-03-20 NOTE — Telephone Encounter (Signed)
Printed.  TK 

## 2016-04-17 ENCOUNTER — Telehealth (HOSPITAL_COMMUNITY): Payer: Self-pay | Admitting: *Deleted

## 2016-04-17 ENCOUNTER — Other Ambulatory Visit (HOSPITAL_COMMUNITY): Payer: Self-pay | Admitting: Oncology

## 2016-04-17 DIAGNOSIS — R0781 Pleurodynia: Secondary | ICD-10-CM

## 2016-04-17 DIAGNOSIS — I2602 Saddle embolus of pulmonary artery with acute cor pulmonale: Secondary | ICD-10-CM

## 2016-04-17 DIAGNOSIS — M5410 Radiculopathy, site unspecified: Secondary | ICD-10-CM

## 2016-04-17 MED ORDER — ENOXAPARIN SODIUM 100 MG/ML ~~LOC~~ SOLN: 170.0000 mg | Freq: Two times a day (BID) | SUBCUTANEOUS | 5 refills | Status: DC

## 2016-04-17 MED ORDER — OXYCODONE HCL 10 MG PO TABS: 10.0000 mg | ORAL_TABLET | Freq: Four times a day (QID) | ORAL | 0 refills | Status: DC | PRN

## 2016-05-14 ENCOUNTER — Ambulatory Visit: Payer: Self-pay | Admitting: Emergency Medicine

## 2016-05-16 ENCOUNTER — Telehealth (HOSPITAL_COMMUNITY): Payer: Self-pay | Admitting: *Deleted

## 2016-05-16 ENCOUNTER — Encounter (HOSPITAL_COMMUNITY): Payer: Self-pay | Admitting: Adult Health

## 2016-05-16 ENCOUNTER — Other Ambulatory Visit (HOSPITAL_COMMUNITY): Payer: Self-pay | Admitting: Adult Health

## 2016-05-16 DIAGNOSIS — M5410 Radiculopathy, site unspecified: Secondary | ICD-10-CM

## 2016-05-16 DIAGNOSIS — I2602 Saddle embolus of pulmonary artery with acute cor pulmonale: Secondary | ICD-10-CM

## 2016-05-16 DIAGNOSIS — R0781 Pleurodynia: Secondary | ICD-10-CM

## 2016-05-16 MED ORDER — OXYCODONE HCL 10 MG PO TABS: 10.0000 mg | ORAL_TABLET | Freq: Four times a day (QID) | ORAL | 0 refills | Status: DC | PRN

## 2016-05-16 NOTE — Progress Notes (Signed)
Patient called cancer center requesting refill of Oxycodone.   [place] Controlled Substance Registry reviewed and refill is appropriate on or after 05/22/16. Paper prescription printed & post-dated for 05/22/16; Rx left at cancer center front desk for patient to retrieve after showing photo ID per clinic policy.   [place] reviewed:     Mike Craze, NP [place] [phone]

## 2016-05-16 NOTE — Telephone Encounter (Signed)
Refill printed.   Mike Craze, NP [place] [phone]

## 2016-05-17 ENCOUNTER — Telehealth (HOSPITAL_COMMUNITY): Payer: Self-pay

## 2016-05-17 NOTE — Telephone Encounter (Signed)
Patient called wanting to know why she could not get her pain medication filled on the 28th like she usually does. NP had written prescription to be filled on or after 05/22/16. Reviewed with Dr. Talbert Cage who said to follow NP's prescription. Patient wanted to speak to [place], the PA. Explained that he was off today and wouldn't be back until Monday. Patient states Dr. Whitney Muse and Gershon Mussel always let her fill her pain meds on the 28th. I explained she could call and leave message for Tom on Monday but she refused and said she was going to find another doctor since Dr. Whitney Muse has left.

## 2016-06-12 ENCOUNTER — Encounter (HOSPITAL_COMMUNITY): Payer: Self-pay | Admitting: Oncology

## 2016-06-12 ENCOUNTER — Encounter (HOSPITAL_COMMUNITY): Payer: Medicaid Other | Attending: Oncology | Admitting: Oncology

## 2016-06-12 ENCOUNTER — Encounter (HOSPITAL_COMMUNITY): Payer: Medicaid Other

## 2016-06-12 VITALS — BP 138/64 | HR 80 | Resp 16 | Ht 70.0 in | Wt 359.8 lb

## 2016-06-12 DIAGNOSIS — R252 Cramp and spasm: Secondary | ICD-10-CM | POA: Diagnosis not present

## 2016-06-12 DIAGNOSIS — I2602 Saddle embolus of pulmonary artery with acute cor pulmonale: Secondary | ICD-10-CM | POA: Diagnosis not present

## 2016-06-12 DIAGNOSIS — M5410 Radiculopathy, site unspecified: Secondary | ICD-10-CM | POA: Diagnosis not present

## 2016-06-12 DIAGNOSIS — E669 Obesity, unspecified: Secondary | ICD-10-CM | POA: Diagnosis not present

## 2016-06-12 DIAGNOSIS — D500 Iron deficiency anemia secondary to blood loss (chronic): Secondary | ICD-10-CM | POA: Diagnosis not present

## 2016-06-12 DIAGNOSIS — R0781 Pleurodynia: Secondary | ICD-10-CM

## 2016-06-12 DIAGNOSIS — Z9889 Other specified postprocedural states: Secondary | ICD-10-CM | POA: Insufficient documentation

## 2016-06-12 DIAGNOSIS — Z7901 Long term (current) use of anticoagulants: Secondary | ICD-10-CM | POA: Diagnosis not present

## 2016-06-12 DIAGNOSIS — D509 Iron deficiency anemia, unspecified: Secondary | ICD-10-CM | POA: Insufficient documentation

## 2016-06-12 DIAGNOSIS — Z86711 Personal history of pulmonary embolism: Secondary | ICD-10-CM | POA: Insufficient documentation

## 2016-06-12 DIAGNOSIS — F17210 Nicotine dependence, cigarettes, uncomplicated: Secondary | ICD-10-CM | POA: Diagnosis not present

## 2016-06-12 DIAGNOSIS — G4733 Obstructive sleep apnea (adult) (pediatric): Secondary | ICD-10-CM | POA: Diagnosis not present

## 2016-06-12 DIAGNOSIS — I10 Essential (primary) hypertension: Secondary | ICD-10-CM | POA: Diagnosis not present

## 2016-06-12 LAB — FERRITIN: Ferritin: 13 ng/mL (ref 11–307)

## 2016-06-12 LAB — CBC WITH DIFFERENTIAL/PLATELET
BASOS ABS: 0 10*3/uL (ref 0.0–0.1)
Basophils Relative: 1 %
EOS PCT: 2 %
Eosinophils Absolute: 0.2 10*3/uL (ref 0.0–0.7)
HEMATOCRIT: 40.3 % (ref 36.0–46.0)
Hemoglobin: 13 g/dL (ref 12.0–15.0)
LYMPHS ABS: 1.5 10*3/uL (ref 0.7–4.0)
LYMPHS PCT: 23 %
MCH: 28.4 pg (ref 26.0–34.0)
MCHC: 32.3 g/dL (ref 30.0–36.0)
MCV: 88 fL (ref 78.0–100.0)
MONO ABS: 0.5 10*3/uL (ref 0.1–1.0)
Monocytes Relative: 7 %
NEUTROS ABS: 4.2 10*3/uL (ref 1.7–7.7)
Neutrophils Relative %: 67 %
Platelets: 318 10*3/uL (ref 150–400)
RBC: 4.58 MIL/uL (ref 3.87–5.11)
RDW: 15.4 % (ref 11.5–15.5)
WBC: 6.4 10*3/uL (ref 4.0–10.5)

## 2016-06-12 LAB — POTASSIUM: POTASSIUM: 4.2 mmol/L (ref 3.5–5.1)

## 2016-06-12 MED ORDER — OXYCODONE HCL 10 MG PO TABS: 10.0000 mg | ORAL_TABLET | Freq: Four times a day (QID) | ORAL | 0 refills | Status: DC | PRN

## 2016-06-12 NOTE — Patient Instructions (Signed)
[place] at [facility] Discharge Instructions  RECOMMENDATIONS MADE BY THE CONSULTANT AND ANY TEST RESULTS WILL BE SENT TO YOUR REFERRING PHYSICIAN.  You were seen today by Kirby Crigler PA-C. Continue Lovenox injections. Return in 3 months for labs. Return in 6 months for labs and follow up.   Thank you for choosing [place] at [facility] to provide your oncology and hematology care.  To afford each patient quality time with our provider, please arrive at least 15 minutes before your scheduled appointment time.    If you have a lab appointment with the [place] please come in thru the  Main Entrance and check in at the main information desk  You need to re-schedule your appointment should you arrive 10 or more minutes late.  We strive to give you quality time with our providers, and arriving late affects you and other patients whose appointments are after yours.  Also, if you no show three or more times for appointments you may be dismissed from the clinic at the providers discretion.     Again, thank you for choosing [facility].  Our hope is that these requests will decrease the amount of time that you wait before being seen by our physicians.       _____________________________________________________________  Should you have questions after your visit to [facility], please contact our office at [phone] between the hours of 8:30 a.m. and 4:30 p.m.  Voicemails left after 4:30 p.m. will not be returned until the following business day.  For prescription refill requests, have your pharmacy contact our office.       Resources For Cancer Patients and their Caregivers ? American Cancer Society: Can assist with transportation, wigs, general needs, runs Look Good Feel Better.        605-298-4714 ? Cancer Care: Provides financial assistance, online support groups, medication/co-pay assistance.   1-800-813-HOPE 765-242-9041) ? [facility] Assists [place] cancer patients and their families through emotional , educational and financial support.  867-616-9517 ? [place] DSS Where to apply for food stamps, Medicaid and utility assistance. (386)764-2477 ? [facility]: Transportation to medical appointments. 814 274 2555 ? Social Security Administration: May apply for disability if have a Stage IV cancer. 5161170723 828-748-8384 ? [employer], Disability and Transit Services: Assists with nutrition, care and transit needs. [place] Support Programs: @10RELATIVEDAYS @ > Cancer Support Group  2nd Tuesday of the month 1pm-2pm, Journey Room  > Creative Journey  3rd Tuesday of the month 1130am-1pm, Journey Room  > Look Good Feel Better  1st Wednesday of the month 10am-12 noon, Journey Room (Call [place] to register [phone])

## 2016-06-12 NOTE — Assessment & Plan Note (Addendum)
History of saddle PE, unprovoked, with CT evidence of right strain and clot extending from the distal main pulmonary artery and to the left and right pulmonary arteries with extension into the LUL, LLL, and lingula branches. RUL, RML, and RLL extension noted and subsegmental extension appreciated.  Diagnosed on CT angio of chest on 04/27/2013.  She was hospitalized for right heart strain and hospitalist referred patient to IR for IVC filter placement on 04/27/2013.  She was seen at [facility] coagulation clinic in March 2017 for recommendations which included lifelong anticoagulation.  On Lovenox.    Labs today: CBC diff, ferritin.  I personally reviewed and went over laboratory results with the patient.  The results are noted within this dictation.  For her muscle cramping, I have added a K+ to her labs today after discussing with the lab confirming blood available for testing.  Labs in 3 months: CBC diff, BMET, iron/TIBC, ferritin. Labs in 6 months: CBC diff, CMET, iron/TIBC, ferritin.  She wants to switch to an oral agent for anticoagulation.  Per [facility]: Patient is interested in changing back to an oral anticoagulant. As before, do not recommend Xarelto or any of the DOACs at this point given the lack of data with their use in individuals weighing > 120 kg. Therefore, the preferred oral anticoagulant would be warfarin. Patient will need to ensure she has a way to have INRs monitored, probably with PCP, prior to making this change. She can start warfarin and overlap with Lovenox until INR is in the therapeutic range. We may have better data on the DOACs in obesity in the upcoming years, so can likely make the change to a DOAC at some point in the future.  I have refilled her pain medication after reviewing the [facility] Controlled Substance Reporting System.  This can be filled on 06/17/2016.  She gets paid on 06/17/2016 and therefore, she would like future Rxs to be filled on this date when she has available  funds.  Return in 6 months for follow-up.

## 2016-06-12 NOTE — Progress Notes (Signed)
[name] [name], [place] [address]  Acute saddle pulmonary embolism with acute cor pulmonale (HCC) - Plan: CBC with Differential, Basic metabolic panel, CBC with Differential, Comprehensive metabolic panel, Oxycodone HCl 10 MG TABS  Iron deficiency anemia due to chronic blood loss - Plan: CBC with Differential, Basic metabolic panel, Iron and TIBC, Ferritin, CBC with Differential, Comprehensive metabolic panel, Iron and TIBC, Ferritin  Pleuritic chest pain - Plan: Oxycodone HCl 10 MG TABS  Back pain with left-sided radiculopathy - Plan: Oxycodone HCl 10 MG TABS  Leg cramp - Plan: Potassium, Potassium  CURRENT THERAPY: Lovenox daily and IV iron when indicated.  INTERVAL HISTORY: [name] [name] 41 y.o. female returns for followup of her history of saddle PE, unprovoked, with CT evidence of right strain and clot extending from the distal main pulmonary artery and to the left and right pulmonary arteries with extension into the LUL, LLL, and lingula branches. RUL, RML, and RLL extension noted and subsegmental extension appreciated.  Diagnosed on CT angio of chest on 04/27/2013.  She was hospitalized for right heart strain and hospitalist referred patient to IR for IVC filter placement on 04/27/2013.  She was seen at [facility] coagulation clinic in March 2017 for recommendations which included lifelong anticoagulation.  On Lovenox.   AND Iron deficiency anemia having needed IV iron replacement therapy.  HPI Elements   Location: B/L lungs  Quality: Saddle  Severity: Severe with right heart strain  Duration: Dx on 04/27/2013 with resolution on 10/21/2013 follow-up Ct angio chest.  Context: Unprovoked  Timing:   Modifying Factors: IVC filter placed on 04/29/2013 by IR with hospitalist managing.  Associated Signs & Symptoms:    She reports a right leg muscle cramp.  I will add potassium level to blood work today.  She reports an appetite of 100%.  She has an energy  level of 75%.  She denies any pain.  She had a second opinion at [facility]: Patient Instructions - [name], MD - 04/18/2015 9:20 AM EDT You should continue on blood thinners long term due to your previous unprovoked clots, especially given the severe nature of your lung clots. Similar to my recommendations at your initial visit, I would not recommend transitioning to one of the new oral anticoagulants like Xarelto at this time. These medications have not been studied well in individuals with weights >~250 pounds, so we don't know how effective they are in this population. As such, if you wish to change from the Lovenox shots to an oral medication, I would recommend warfarin or Coumadin. You would start this and overlap it with Lovenox until your Coumadin levels are in the therapeutic range. This therapeutic range is an INR of 2-3. You will need to have someone locally to follow your INR levels and adjust Coumadin doses as needed. This is typically done by a primary care doctor, although Dr. Whitney Muse may be able to assist as well. You should be mindful of having stable intake of vitamin K containing foods while on Coumadin. You do not need to eliminate them from your diet but just eat a similar amount every week. Otherwise it will make keeping your Coumadin levels in the therapeutic range of difficult task. I anticipate having data in the next few years on the use of the new oral anticoagulants at the extremes of the body weight spectrum, so likely we can transitioning to one of these medicines in the future.  Regarding  your left leg pain symptoms, I imagine it is due to something called post-thrombotic syndrome. The main treatment for this is to wear compression stockings. Try the over-the-counter type first and if no improvement then you can fill the prescription provided today for medical grade stockings. If ultimately you cannot tolerate the stockings or they do not improve symptoms significantly,  then there is no need to continue wearing them. I recommend that you avoid any estrogen containing systemic hormonal preparations. The safest hormonal option for birth control is the [place] or [place] IUDs. The next safest options are the progesterone only pills or other progesterone only preparations. Taking them while on anticoagulation is likely safe, but again I would recommend avoiding estrogen containing preparations. If you were to become pregnant again in the future, you would need to be treated with Lovenox throughout the pregnancy like before.  For now, lets plan to see back here every 1-2 years to reassess the decision for long-term blood thinners and if there are alternative options available to you. I'm happy to see her sooner if new questions or problems.  Review of Systems  Constitutional: Negative.  Negative for chills, fever and weight loss.  HENT: Negative.   Eyes: Negative.   Respiratory: Negative.  Negative for cough.   Cardiovascular: Negative.  Negative for chest pain.  Gastrointestinal: Negative.  Negative for blood in stool, constipation, diarrhea, melena, nausea and vomiting.  Genitourinary: Negative.   Musculoskeletal: Positive for myalgias (right leg cramp).  Skin: Negative.   Neurological: Negative.  Negative for weakness.  Endo/Heme/Allergies: Negative.   Psychiatric/Behavioral: Negative.     Past Medical History:  Diagnosis Date  . Abdominal cramps 08/30/2015  . Closed left ankle fracture    August 30 2012  . DVT (deep venous thrombosis) (HCC)    L leg  . Fibroid tumor   . Hidradenitis   . High blood pressure   . Hypertension   . Obesity   . OSA on CPAP   . Pregnancy    09/20/14 17 weeks  . Pulmonary embolism ([place]) 04/2013  . Tobacco use disorder     Past Surgical History:  Procedure Laterality Date  . ABSCESS DRAINAGE Bilateral 01/10/15  . CYST REMOVAL TRUNK    . IVC FILTER PLACEMENT ([facility] HX)      Family History  Problem Relation Age of Onset    . Hypertension Mother   . Diabetes Paternal Aunt   . Diabetes Paternal Uncle   . Other Father        blood clots  . Cancer Maternal Grandmother   . Heart attack Paternal Grandmother     Social History   Social History  . Marital status: Widowed    Spouse name: N/A  . Number of children: N/A  . Years of education: N/A   Social History Main Topics  . Smoking status: Current Some Day Smoker    Packs/day: 0.00    Years: 18.00    Types: Cigarettes  . Smokeless tobacco: Never Used     Comment: smokes 3 cig when she smokes  . Alcohol use No  . Drug use: No  . Sexual activity: Yes    Birth control/ protection: Pill   Other Topics Concern  . None   Social History Narrative  . None     PHYSICAL EXAMINATION  ECOG PERFORMANCE STATUS: 1 - Symptomatic but completely ambulatory  Vitals:   06/12/16 1127  BP: 138/64  Pulse: 80  Resp: 16    GENERAL:alert,  no distress, well nourished, well developed, comfortable, cooperative, obese, smiling and unaccompanied SKIN: skin color, texture, turgor are normal, no rashes or significant lesions HEAD: Normocephalic, No masses, lesions, tenderness or abnormalities EYES: normal, EOMI, Conjunctiva are pink and non-injected EARS: External ears normal OROPHARYNX:lips, buccal mucosa, and tongue normal and mucous membranes are moist  NECK: supple, trachea midline LYMPH:  no palpable lymphadenopathy BREAST:not examined LUNGS: clear to auscultation  HEART: regular rate & rhythm ABDOMEN:abdomen soft, obese and normal bowel sounds BACK: Back symmetric, no curvature. EXTREMITIES:less then 2 second capillary refill, no joint deformities, effusion, or inflammation, no edema, no skin discoloration, no clubbing, no cyanosis  NEURO: alert & oriented x 3 with fluent speech, no focal motor/sensory deficits, gait normal   LABORATORY DATA: CBC    Component Value Date/Time   WBC 6.4 06/12/2016 1003   RBC 4.58 06/12/2016 1003   HGB 13.0  06/12/2016 1003   HCT 40.3 06/12/2016 1003   HCT 37.7 01/18/2015 1653   PLT 318 06/12/2016 1003   PLT 214 01/18/2015 1653   MCV 88.0 06/12/2016 1003   MCV 95 01/18/2015 1653   MCH 28.4 06/12/2016 1003   MCHC 32.3 06/12/2016 1003   RDW 15.4 06/12/2016 1003   RDW 14.3 01/18/2015 1653   LYMPHSABS 1.5 06/12/2016 1003   MONOABS 0.5 06/12/2016 1003   EOSABS 0.2 06/12/2016 1003   BASOSABS 0.0 06/12/2016 1003      Chemistry      Component Value Date/Time   NA 137 04/03/2015 1420   NA 139 01/18/2015 1653   K 4.2 06/12/2016 1003   CL 102 04/03/2015 1420   CO2 29 04/03/2015 1420   BUN 19 04/03/2015 1420   BUN 9 01/18/2015 1653   CREATININE 1.06 (H) 04/03/2015 1420      Component Value Date/Time   CALCIUM 8.9 04/03/2015 1420   ALKPHOS 57 04/03/2015 1420   AST 13 (L) 04/03/2015 1420   ALT 11 (L) 04/03/2015 1420   BILITOT 0.5 04/03/2015 1420   BILITOT <0.2 01/18/2015 1653     Lab Results  Component Value Date   IRON 59 01/11/2015   TIBC 475 (H) 01/11/2015   FERRITIN 13 06/12/2016      PENDING LABS:   RADIOGRAPHIC STUDIES:  No results found.   PATHOLOGY:    ASSESSMENT AND PLAN:  Saddle embolus of pulmonary artery with acute cor pulmonale History of saddle PE, unprovoked, with CT evidence of right strain and clot extending from the distal main pulmonary artery and to the left and right pulmonary arteries with extension into the LUL, LLL, and lingula branches. RUL, RML, and RLL extension noted and subsegmental extension appreciated.  Diagnosed on CT angio of chest on 04/27/2013.  She was hospitalized for right heart strain and hospitalist referred patient to IR for IVC filter placement on 04/27/2013.  She was seen at [facility] coagulation clinic in March 2017 for recommendations which included lifelong anticoagulation.  On Lovenox.    Labs today: CBC diff, ferritin.  I personally reviewed and went over laboratory results with the patient.  The results are noted within this  dictation.  For her muscle cramping, I have added a K+ to her labs today after discussing with the lab confirming blood available for testing.  Labs in 3 months: CBC diff, BMET, iron/TIBC, ferritin. Labs in 6 months: CBC diff, CMET, iron/TIBC, ferritin.  She wants to switch to an oral agent for anticoagulation.  Per [facility]: Patient is interested in changing back to an oral  anticoagulant. As before, do not recommend Xarelto or any of the DOACs at this point given the lack of data with their use in individuals weighing > 120 kg. Therefore, the preferred oral anticoagulant would be warfarin. Patient will need to ensure she has a way to have INRs monitored, probably with PCP, prior to making this change. She can start warfarin and overlap with Lovenox until INR is in the therapeutic range. We may have better data on the DOACs in obesity in the upcoming years, so can likely make the change to a DOAC at some point in the future.  I have refilled her pain medication after reviewing the [facility] Controlled Substance Reporting System.  This can be filled on 06/17/2016.  She gets paid on 06/17/2016 and therefore, she would like future Rxs to be filled on this date when she has available funds.  Return in 6 months for follow-up.   ORDERS PLACED FOR THIS ENCOUNTER: Orders Placed This Encounter  Procedures  . CBC with Differential  . Basic metabolic panel  . Iron and TIBC  . Ferritin  . CBC with Differential  . Comprehensive metabolic panel  . Iron and TIBC  . Ferritin  . Potassium    MEDICATIONS PRESCRIBED THIS ENCOUNTER: Meds ordered this encounter  Medications  . Oxycodone HCl 10 MG TABS    Sig: Take 1 tablet (10 mg total) by mouth every 6 (six) hours as needed.    Dispense:  45 tablet    Refill:  0    Fill on 06/17/2016    Order Specific Question:   Supervising Provider    Answer:   Brunetta Genera [[id number]]    THERAPY PLAN:  Continue with Lovenox BID anticoagulation.  All  questions were answered. The patient knows to call the clinic with any problems, questions or concerns. We can certainly see the patient much sooner if necessary.  Patient and plan discussed with Dr. Twana First and she is in agreement with the aforementioned.   This note is electronically signed by: Doy Mince 06/12/2016 2:07 PM

## 2016-07-17 ENCOUNTER — Other Ambulatory Visit (HOSPITAL_COMMUNITY): Payer: Self-pay | Admitting: Adult Health

## 2016-07-17 ENCOUNTER — Encounter (HOSPITAL_COMMUNITY): Payer: Self-pay | Admitting: Adult Health

## 2016-07-17 DIAGNOSIS — I2602 Saddle embolus of pulmonary artery with acute cor pulmonale: Secondary | ICD-10-CM

## 2016-07-17 DIAGNOSIS — M5410 Radiculopathy, site unspecified: Secondary | ICD-10-CM

## 2016-07-17 DIAGNOSIS — R0781 Pleurodynia: Secondary | ICD-10-CM

## 2016-07-17 MED ORDER — OXYCODONE HCL 10 MG PO TABS: 10.0000 mg | ORAL_TABLET | Freq: Four times a day (QID) | ORAL | 0 refills | Status: DC | PRN

## 2016-07-17 NOTE — Progress Notes (Signed)
Patient called cancer center requesting refill of Oxycodone.   [place] Controlled Substance Reporting System reviewed and refill is appropriate on or after 07/18/16. Paper prescription printed & post-dated; Rx left at cancer center front desk for patient to retrieve after showing photo ID per clinic policy.   [place]CCSRS reviewed:     Mike Craze, NP [place] [phone]

## 2016-08-15 ENCOUNTER — Telehealth (HOSPITAL_COMMUNITY): Payer: Self-pay | Admitting: *Deleted

## 2016-08-15 ENCOUNTER — Other Ambulatory Visit (HOSPITAL_COMMUNITY): Payer: Self-pay | Admitting: Oncology

## 2016-08-15 DIAGNOSIS — R0781 Pleurodynia: Secondary | ICD-10-CM

## 2016-08-15 DIAGNOSIS — M5410 Radiculopathy, site unspecified: Secondary | ICD-10-CM

## 2016-08-15 DIAGNOSIS — I2602 Saddle embolus of pulmonary artery with acute cor pulmonale: Secondary | ICD-10-CM

## 2016-08-15 MED ORDER — OXYCODONE HCL 10 MG PO TABS: 10.0000 mg | ORAL_TABLET | Freq: Four times a day (QID) | ORAL | 0 refills | Status: DC | PRN

## 2016-08-15 NOTE — Telephone Encounter (Signed)
Rx printed.  TK 

## 2016-09-11 ENCOUNTER — Other Ambulatory Visit (HOSPITAL_COMMUNITY): Payer: Self-pay | Admitting: *Deleted

## 2016-09-11 DIAGNOSIS — D508 Other iron deficiency anemias: Secondary | ICD-10-CM

## 2016-09-12 ENCOUNTER — Other Ambulatory Visit (HOSPITAL_COMMUNITY): Payer: Self-pay

## 2016-09-16 ENCOUNTER — Other Ambulatory Visit (HOSPITAL_COMMUNITY): Payer: Self-pay | Admitting: Adult Health

## 2016-09-16 ENCOUNTER — Other Ambulatory Visit: Payer: Self-pay | Admitting: Obstetrics and Gynecology

## 2016-09-16 ENCOUNTER — Encounter (HOSPITAL_COMMUNITY): Payer: Self-pay | Admitting: Adult Health

## 2016-09-16 DIAGNOSIS — M5410 Radiculopathy, site unspecified: Secondary | ICD-10-CM

## 2016-09-16 DIAGNOSIS — R0781 Pleurodynia: Secondary | ICD-10-CM

## 2016-09-16 DIAGNOSIS — I2602 Saddle embolus of pulmonary artery with acute cor pulmonale: Secondary | ICD-10-CM

## 2016-09-16 MED ORDER — OXYCODONE HCL 10 MG PO TABS: 10.0000 mg | ORAL_TABLET | Freq: Four times a day (QID) | ORAL | 0 refills | Status: DC | PRN

## 2016-09-16 NOTE — Progress Notes (Signed)
Patient called cancer center requesting refill of Oxycodone.   [place] Controlled Substance Reporting System reviewed and refill is appropriate on or after 09/16/16. Paper prescription printed & post-dated; Rx left at cancer center front desk for patient to retrieve after showing photo ID per clinic policy.   NCCSRS reviewed:     Mike Craze, NP [place] [phone]

## 2016-09-17 ENCOUNTER — Encounter (HOSPITAL_COMMUNITY): Payer: Medicaid Other | Attending: Oncology

## 2016-09-17 DIAGNOSIS — D508 Other iron deficiency anemias: Secondary | ICD-10-CM | POA: Diagnosis not present

## 2016-09-17 LAB — CBC WITH DIFFERENTIAL/PLATELET
BASOS ABS: 0 10*3/uL (ref 0.0–0.1)
BASOS PCT: 0 %
Eosinophils Absolute: 0.2 10*3/uL (ref 0.0–0.7)
Eosinophils Relative: 3 %
HEMATOCRIT: 36.3 % (ref 36.0–46.0)
Hemoglobin: 11.8 g/dL — ABNORMAL LOW (ref 12.0–15.0)
LYMPHS PCT: 24 %
Lymphs Abs: 1.5 10*3/uL (ref 0.7–4.0)
MCH: 27.9 pg (ref 26.0–34.0)
MCHC: 32.5 g/dL (ref 30.0–36.0)
MCV: 85.8 fL (ref 78.0–100.0)
MONO ABS: 0.3 10*3/uL (ref 0.1–1.0)
Monocytes Relative: 4 %
NEUTROS ABS: 4.5 10*3/uL (ref 1.7–7.7)
NEUTROS PCT: 69 %
PLATELETS: 230 10*3/uL (ref 150–400)
RBC: 4.23 MIL/uL (ref 3.87–5.11)
RDW: 16.6 % — AB (ref 11.5–15.5)
WBC: 6.5 10*3/uL (ref 4.0–10.5)

## 2016-09-17 LAB — BASIC METABOLIC PANEL
ANION GAP: 7 (ref 5–15)
BUN: 18 mg/dL (ref 6–20)
CALCIUM: 9 mg/dL (ref 8.9–10.3)
CO2: 26 mmol/L (ref 22–32)
Chloride: 105 mmol/L (ref 101–111)
Creatinine, Ser: 0.72 mg/dL (ref 0.44–1.00)
Glucose, Bld: 91 mg/dL (ref 65–99)
POTASSIUM: 4.2 mmol/L (ref 3.5–5.1)
Sodium: 138 mmol/L (ref 135–145)

## 2016-09-17 LAB — IRON AND TIBC
Iron: 24 ug/dL — ABNORMAL LOW (ref 28–170)
SATURATION RATIOS: 7 % — AB (ref 10.4–31.8)
TIBC: 356 ug/dL (ref 250–450)
UIBC: 332 ug/dL

## 2016-09-17 LAB — FERRITIN: Ferritin: 19 ng/mL (ref 11–307)

## 2016-09-18 ENCOUNTER — Other Ambulatory Visit (HOSPITAL_COMMUNITY): Payer: Self-pay | Admitting: Adult Health

## 2016-09-20 ENCOUNTER — Encounter (HOSPITAL_COMMUNITY): Payer: Self-pay

## 2016-09-20 ENCOUNTER — Encounter (HOSPITAL_BASED_OUTPATIENT_CLINIC_OR_DEPARTMENT_OTHER): Payer: Medicaid Other

## 2016-09-20 VITALS — BP 159/81 | HR 69 | Temp 97.9°F | Resp 16 | Wt 365.6 lb

## 2016-09-20 DIAGNOSIS — N920 Excessive and frequent menstruation with regular cycle: Secondary | ICD-10-CM | POA: Diagnosis not present

## 2016-09-20 DIAGNOSIS — D500 Iron deficiency anemia secondary to blood loss (chronic): Secondary | ICD-10-CM

## 2016-09-20 MED ORDER — SODIUM CHLORIDE 0.9% FLUSH: 10.0000 mL | Freq: Once | INTRAVENOUS | Status: DC

## 2016-09-20 MED ORDER — FERRIC CARBOXYMALTOSE 750 MG/15ML IV SOLN
750.0000 mg | Freq: Once | INTRAVENOUS | Status: AC
  Administered 2016-09-20: 750 mg via INTRAVENOUS
  Filled 2016-09-20: qty 15

## 2016-09-20 MED ORDER — SODIUM CHLORIDE 0.9% FLUSH
3.0000 mL | Freq: Once | INTRAVENOUS | Status: AC
  Administered 2016-09-20: 3 mL via INTRAVENOUS

## 2016-09-20 MED ORDER — SODIUM CHLORIDE 0.9 % IV SOLN: INTRAVENOUS | Status: DC

## 2016-09-20 NOTE — Progress Notes (Signed)
Patient for [facility] today.  Mild fatigue with no other complaints voiced.  Instructed on side effects of Injectafer and to notify the nurse of anything different from how she feels at this time.  Verbalized understanding.   Patient tolerated injectafer with no complaints voiced.  Good blood return noted before and after administration of therapy.  IV site clean and dry with no bruising or swelling noted at site.  Band aid applied.  VSS and discharged ambulatory by self.

## 2016-09-20 NOTE — Patient Instructions (Signed)
[place] at [facility]  Discharge Instructions:  You received injectafer today.  Keep scheduled appointments and call for any problems or questions.  _______________________________________________________________  Thank you for choosing [place] at [facility] to provide your oncology and hematology care.  To afford each patient quality time with our providers, please arrive at least 15 minutes before your scheduled appointment.  You need to re-schedule your appointment if you arrive 10 or more minutes late.  We strive to give you quality time with our providers, and arriving late affects you and other patients whose appointments are after yours.  Also, if you no show three or more times for appointments you may be dismissed from the clinic.  Again, thank you for choosing [place] at [place] hope is that these requests will allow you access to exceptional care and in a timely manner. _______________________________________________________________  If you have questions after your visit, please contact our office at [phone] between the hours of 8:30 a.m. and 5:00 p.m. Voicemails left after 4:30 p.m. will not be returned until the following business day. _______________________________________________________________  For prescription refill requests, have your pharmacy contact our office. _______________________________________________________________  Recommendations made by the consultant and any test results will be sent to your referring physician. _______________________________________________________________

## 2016-09-29 ENCOUNTER — Other Ambulatory Visit: Payer: Self-pay | Admitting: Adult Health

## 2016-10-16 ENCOUNTER — Telehealth (HOSPITAL_COMMUNITY): Payer: Self-pay | Admitting: *Deleted

## 2016-10-16 ENCOUNTER — Other Ambulatory Visit (HOSPITAL_COMMUNITY): Payer: Self-pay | Admitting: Oncology

## 2016-10-16 DIAGNOSIS — M5410 Radiculopathy, site unspecified: Secondary | ICD-10-CM

## 2016-10-16 DIAGNOSIS — I2602 Saddle embolus of pulmonary artery with acute cor pulmonale: Secondary | ICD-10-CM

## 2016-10-16 DIAGNOSIS — R0781 Pleurodynia: Secondary | ICD-10-CM

## 2016-10-16 MED ORDER — OXYCODONE HCL 10 MG PO TABS: 10.0000 mg | ORAL_TABLET | Freq: Four times a day (QID) | ORAL | 0 refills | Status: DC | PRN

## 2016-10-23 ENCOUNTER — Emergency Department (HOSPITAL_COMMUNITY)
Admission: EM | Admit: 2016-10-23 | Discharge: 2016-10-23 | Disposition: A | Discharge status: Home / Self Care | Payer: Medicaid Other | Attending: Emergency Medicine | Admitting: Emergency Medicine

## 2016-10-23 ENCOUNTER — Emergency Department (HOSPITAL_COMMUNITY): Payer: Medicaid Other

## 2016-10-23 ENCOUNTER — Encounter (HOSPITAL_COMMUNITY): Payer: Self-pay | Admitting: Emergency Medicine

## 2016-10-23 DIAGNOSIS — Z86718 Personal history of other venous thrombosis and embolism: Secondary | ICD-10-CM | POA: Diagnosis not present

## 2016-10-23 DIAGNOSIS — F17210 Nicotine dependence, cigarettes, uncomplicated: Secondary | ICD-10-CM | POA: Diagnosis not present

## 2016-10-23 DIAGNOSIS — I10 Essential (primary) hypertension: Secondary | ICD-10-CM | POA: Insufficient documentation

## 2016-10-23 DIAGNOSIS — Z79899 Other long term (current) drug therapy: Secondary | ICD-10-CM | POA: Diagnosis not present

## 2016-10-23 DIAGNOSIS — M5442 Lumbago with sciatica, left side: Secondary | ICD-10-CM | POA: Diagnosis not present

## 2016-10-23 DIAGNOSIS — M545 Low back pain: Secondary | ICD-10-CM | POA: Diagnosis present

## 2016-10-23 LAB — URINALYSIS, ROUTINE W REFLEX MICROSCOPIC
Bilirubin Urine: NEGATIVE
GLUCOSE, UA: NEGATIVE mg/dL
Hgb urine dipstick: NEGATIVE
Ketones, ur: NEGATIVE mg/dL
LEUKOCYTES UA: NEGATIVE
NITRITE: NEGATIVE
PH: 5 (ref 5.0–8.0)
Protein, ur: NEGATIVE mg/dL
Specific Gravity, Urine: 1.024 (ref 1.005–1.030)

## 2016-10-23 LAB — POC URINE PREG, ED: Preg Test, Ur: NEGATIVE

## 2016-10-23 MED ORDER — HYDROMORPHONE HCL 1 MG/ML IJ SOLN
1.0000 mg | Freq: Once | INTRAMUSCULAR | Status: AC
  Administered 2016-10-23: 1 mg via INTRAMUSCULAR
  Filled 2016-10-23: qty 1

## 2016-10-23 MED ORDER — CYCLOBENZAPRINE HCL 10 MG PO TABS: 10.0000 mg | ORAL_TABLET | Freq: Two times a day (BID) | ORAL | 0 refills | Status: DC | PRN

## 2016-10-23 MED ORDER — PREDNISONE 10 MG PO TABS: 10.0000 mg | ORAL_TABLET | Freq: Every day | ORAL | 0 refills | Status: DC

## 2016-10-23 MED ORDER — OXYCODONE-ACETAMINOPHEN 5-325 MG PO TABS: 1.0000 | ORAL_TABLET | ORAL | 0 refills | Status: DC | PRN

## 2016-10-23 NOTE — ED Triage Notes (Addendum)
PT c/o left leg pain and lower back pain x3 days but denies any injuries. PT states history of DVT to same leg as well and states hasn't had her HTN medication today.

## 2016-10-23 NOTE — Discharge Instructions (Signed)
Your x-ray shows an abnormal lumbar spine. You'll need to see Dr. Cyndy Freeze. Prescription for pain medicine, prednisone, muscle relaxer. Recommend ice pack to back

## 2016-10-26 NOTE — ED Provider Notes (Signed)
[facility] DEPT Provider Note   CSN: [id number] Arrival date & time: 10/23/16  1055     History   Chief Complaint Chief Complaint  Patient presents with  . Back Pain    HPI [name] [name] is a 41 y.o. female.  Low back pain for 3 days with radiation to left leg. No new injury. She has been on Lovenox for some time secondary to a history of DVT. Additionally, she has an IVC filter in place. She has had a previous consultation from Dr. Cyndy Freeze neurosurgery for her low back issues. There was a question whether surgery could be safely performed with the patient on Lovenox. Severity of pain is moderate. Positioning and palpation make pain worse. No bowel or bladder incontinence.      Past Medical History:  Diagnosis Date  . Abdominal cramps 08/30/2015  . Closed left ankle fracture    August 30 2012  . DVT (deep venous thrombosis) (HCC)    L leg  . Fibroid tumor   . Hidradenitis   . High blood pressure   . Hypertension   . Obesity   . OSA on CPAP   . Pregnancy    09/20/14 17 weeks  . Pulmonary embolism ([place]) 04/2013  . Tobacco use disorder     Patient Active Problem List   Diagnosis Date Noted  . Abdominal cramps 08/30/2015  . Morbid obesity ([place]) 08/18/2014  . Fibroid, uterine 08/18/2014  . Benign essential hypertension 07/21/2014  . Iron deficiency anemia due to chronic blood loss 02/05/2014  . Back pain with left-sided radiculopathy 10/31/2013  . Secondary pulmonary hypertension 07/06/2013  . Obstructive sleep apnea 07/06/2013  . Pleuritic chest pain 04/27/2013  . SOB (shortness of breath) 04/27/2013  . Hyperglycemia 04/27/2013  . Anemia 04/27/2013  . Tobacco use disorder 04/27/2013  . Saddle embolus of pulmonary artery with acute cor pulmonale ([place]) 04/27/2013    Past Surgical History:  Procedure Laterality Date  . ABSCESS DRAINAGE Bilateral 01/10/15  . CYST REMOVAL TRUNK    . IVC FILTER PLACEMENT ([facility] HX)      OB History    Gravida Para Term  Preterm AB Living   4 1 1   3 1    SAB TAB Ectopic Multiple Live Births   3     0 1       Home Medications    Prior to Admission medications   Medication Sig Start Date End Date Taking? Authorizing Provider  amLODipine (NORVASC) 5 MG tablet Take 0.5 tablets (2.5 mg total) by mouth daily. Patient taking differently: Take 5 mg by mouth daily.  03/02/15  Yes Florian Buff, MD  enoxaparin (LOVENOX) 100 MG/ML injection Inject 1.7 mLs (170 mg total) into the skin every 12 (twelve) hours. 04/17/16  Yes Kefalas, Manon Hilding, PA-C  Ibuprofen (MIDOL PO) Take by mouth as needed.   Yes [provider]  norethindrone (MICRONOR,CAMILA,ERRIN) 0.35 MG tablet TAKE ONE TABLET BY MOUTH ONCE DAILY 09/30/16  Yes Derrek Monaco A, NP  Oxycodone HCl 10 MG TABS Take 1 tablet (10 mg total) by mouth every 6 (six) hours as needed. 10/16/16  Yes Twana First, MD  cyclobenzaprine (FLEXERIL) 10 MG tablet Take 1 tablet (10 mg total) by mouth 2 (two) times daily as needed for muscle spasms. 10/23/16   Nat Christen, MD  oxyCODONE-acetaminophen (PERCOCET) 5-325 MG tablet Take 1-2 tablets by mouth every 4 (four) hours as needed. 10/23/16   Nat Christen, MD  predniSONE (DELTASONE) 10 MG tablet  Take 1 tablet (10 mg total) by mouth daily with breakfast. 3 tablets for 3 days, 2 tablets for 3 days, one tablet for 3 days 10/23/16   Nat Christen, MD  triamterene-hydrochlorothiazide [facility]) 37.5-25 MG tablet TAKE ONE TABLET BY MOUTH ONCE DAILY Patient not taking: Reported on 10/23/2016 03/09/16   Florian Buff, MD    Family History Family History  Problem Relation Age of Onset  . Hypertension Mother   . Diabetes Paternal Aunt   . Diabetes Paternal Uncle   . Other Father        blood clots  . Cancer Maternal Grandmother   . Heart attack Paternal Grandmother     Social History Social History  Substance Use Topics  . Smoking status: Current Some Day Smoker    Packs/day: 0.50    Years: 18.00    Types: Cigarettes  .  Smokeless tobacco: Never Used     Comment: smokes 3 cig when she smokes  . Alcohol use No     Allergies   Sulfa antibiotics   Review of Systems Review of Systems  All other systems reviewed and are negative.    Physical Exam Updated Vital Signs BP (!) 114/57   Pulse 64   Temp 98.6 F (37 C) (Oral)   Resp 18   Ht 5\' 9"  (1.753 m)   Wt (!) 156.5 kg (345 lb)   LMP 10/16/2016   SpO2 100%   BMI 50.95 kg/m   Physical Exam  Constitutional: She is oriented to person, place, and time. She appears well-developed and well-nourished.  HENT:  Head: Normocephalic and atraumatic.  Eyes: Conjunctivae are normal.  Neck: Neck supple.  Cardiovascular: Normal rate and regular rhythm.   Pulmonary/Chest: Effort normal and breath sounds normal.  Abdominal: Soft. Bowel sounds are normal.  Musculoskeletal:  Minimal tenderness of the lower spine. Pain with straight leg raise on the left.  Neurological: She is alert and oriented to person, place, and time.  Skin: Skin is warm and dry.  Psychiatric: She has a normal mood and affect. Her behavior is normal.  Nursing note and vitals reviewed.    ED Treatments / Results  Labs (all labs ordered are listed, but only abnormal results are displayed) Labs Reviewed  URINALYSIS, ROUTINE W REFLEX MICROSCOPIC - Abnormal; Notable for the following:       Result Value   APPearance HAZY (*)    All other components within normal limits  POC URINE PREG, ED    EKG  EKG Interpretation None       Radiology No results found.  Procedures Procedures (including critical care time)  Medications Ordered in ED Medications  HYDROmorphone (DILAUDID) injection 1 mg (1 mg Intramuscular Given 10/23/16 1318)     Initial Impression / Assessment and Plan / ED Course  I have reviewed the triage vital signs and the nursing notes.  Pertinent labs & imaging results that were available during my care of the patient were reviewed by me and considered in my  medical decision making (see chart for details).     History and physical most consistent with sciatic pain.  Plain films of lumbar spine reveal unchanged grade 1 anterolisthesis at L4/L5. This was discussed with the patient. Discharge medications Flexeril 10 mg, Percocet, prednisone. She will follow-up with neurosurgery.  Final Clinical Impressions(s) / ED Diagnoses   Final diagnoses:  Left-sided low back pain with left-sided sciatica, unspecified chronicity    New Prescriptions Discharge Medication List as of 10/23/2016  3:22 PM    START taking these medications   Details  cyclobenzaprine (FLEXERIL) 10 MG tablet Take 1 tablet (10 mg total) by mouth 2 (two) times daily as needed for muscle spasms., Starting Wed 10/23/2016, Print    oxyCODONE-acetaminophen (PERCOCET) 5-325 MG tablet Take 1-2 tablets by mouth every 4 (four) hours as needed., Starting Wed 10/23/2016, Print    predniSONE (DELTASONE) 10 MG tablet Take 1 tablet (10 mg total) by mouth daily with breakfast. 3 tablets for 3 days, 2 tablets for 3 days, one tablet for 3 days, Starting Wed 10/23/2016, Print         Nat Christen, MD 10/26/16 1155

## 2016-11-13 ENCOUNTER — Other Ambulatory Visit (HOSPITAL_COMMUNITY): Payer: Self-pay | Admitting: Adult Health

## 2016-11-13 ENCOUNTER — Encounter (HOSPITAL_COMMUNITY): Payer: Self-pay | Admitting: Adult Health

## 2016-11-13 ENCOUNTER — Telehealth (HOSPITAL_COMMUNITY): Payer: Self-pay | Admitting: *Deleted

## 2016-11-13 DIAGNOSIS — M5410 Radiculopathy, site unspecified: Secondary | ICD-10-CM

## 2016-11-13 DIAGNOSIS — R0781 Pleurodynia: Secondary | ICD-10-CM

## 2016-11-13 DIAGNOSIS — I2602 Saddle embolus of pulmonary artery with acute cor pulmonale: Secondary | ICD-10-CM

## 2016-11-13 MED ORDER — OXYCODONE HCL 10 MG PO TABS: 10.0000 mg | ORAL_TABLET | Freq: Four times a day (QID) | ORAL | 0 refills | Status: DC | PRN

## 2016-11-13 NOTE — Telephone Encounter (Signed)
Rx printed.   Mike Craze, NP [place] [phone]

## 2016-11-13 NOTE — Progress Notes (Signed)
Patient called cancer center requesting refill of Oxycodone.   [place] Controlled Substance Reporting System reviewed and refill is appropriate on or after 11/13/16. Paper prescription printed & post-dated; Rx left at cancer center front desk for patient to retrieve after showing photo ID per clinic policy.   Note: Dr. Nat Christen is an ED physician who provided patient #20 Percocet on 10/23/16 when she had ED visit for back pain. Percocet was d/c'd from her med list. Oxycodone #45 was refilled today.    [place] reviewed:     Mike Craze, NP [place] [phone]

## 2016-11-21 ENCOUNTER — Other Ambulatory Visit (HOSPITAL_COMMUNITY): Payer: Self-pay | Admitting: Neurosurgery

## 2016-11-21 ENCOUNTER — Other Ambulatory Visit: Payer: Self-pay | Admitting: Neurosurgery

## 2016-11-21 DIAGNOSIS — M4316 Spondylolisthesis, lumbar region: Secondary | ICD-10-CM

## 2016-12-03 ENCOUNTER — Ambulatory Visit (HOSPITAL_COMMUNITY): Admission: RE | Admit: 2016-12-03 | Payer: Medicaid Other | Source: Ambulatory Visit

## 2016-12-16 ENCOUNTER — Other Ambulatory Visit (HOSPITAL_COMMUNITY): Payer: Self-pay | Admitting: Oncology

## 2016-12-16 ENCOUNTER — Telehealth (HOSPITAL_COMMUNITY): Payer: Self-pay | Admitting: *Deleted

## 2016-12-16 DIAGNOSIS — R0781 Pleurodynia: Secondary | ICD-10-CM

## 2016-12-16 DIAGNOSIS — I2602 Saddle embolus of pulmonary artery with acute cor pulmonale: Secondary | ICD-10-CM

## 2016-12-16 DIAGNOSIS — M5410 Radiculopathy, site unspecified: Secondary | ICD-10-CM

## 2016-12-16 MED ORDER — OXYCODONE HCL 10 MG PO TABS: 10.0000 mg | ORAL_TABLET | Freq: Four times a day (QID) | ORAL | 0 refills | Status: DC | PRN

## 2016-12-16 NOTE — Telephone Encounter (Signed)
Pt aware that she will need to go to her PCP for pain medication from now on. Pt verbalized understanding.

## 2016-12-16 NOTE — Telephone Encounter (Signed)
Pt called back and stated that she can not get into her PCP until December 19th. I spoke with Dr. Talbert Cage about this and she stated that she would refill the medication one last time but after this the cancer center would no longer fill her pain medication prescription. Pt verbalized understanding.

## 2016-12-20 ENCOUNTER — Ambulatory Visit (HOSPITAL_COMMUNITY): Payer: Self-pay

## 2016-12-20 ENCOUNTER — Other Ambulatory Visit (HOSPITAL_COMMUNITY): Payer: Self-pay

## 2017-01-20 ENCOUNTER — Ambulatory Visit (HOSPITAL_COMMUNITY)
Admission: RE | Admit: 2017-01-20 | Discharge: 2017-01-20 | Disposition: A | Discharge status: Home / Self Care | Payer: Medicaid Other | Source: Ambulatory Visit | Attending: Neurosurgery | Admitting: Neurosurgery

## 2017-01-20 ENCOUNTER — Other Ambulatory Visit (HOSPITAL_COMMUNITY): Payer: Self-pay | Admitting: Neurosurgery

## 2017-01-20 DIAGNOSIS — M4316 Spondylolisthesis, lumbar region: Secondary | ICD-10-CM

## 2017-01-20 DIAGNOSIS — M48061 Spinal stenosis, lumbar region without neurogenic claudication: Secondary | ICD-10-CM | POA: Diagnosis not present

## 2017-01-20 LAB — PREGNANCY, URINE: Preg Test, Ur: NEGATIVE

## 2017-01-20 MED ORDER — LIDOCAINE HCL (PF) 1 % IJ SOLN
5.0000 mL | Freq: Once | INTRAMUSCULAR | Status: AC
  Administered 2017-01-20: 10 mL via INTRADERMAL

## 2017-01-20 MED ORDER — DIAZEPAM 5 MG PO TABS
ORAL_TABLET | ORAL | Status: AC
  Administered 2017-01-20: 10 mg via ORAL
  Filled 2017-01-20: qty 2

## 2017-01-20 MED ORDER — IOPAMIDOL (ISOVUE-M 200) INJECTION 41%
20.0000 mL | Freq: Once | INTRAMUSCULAR | Status: AC
  Administered 2017-01-20: 20 mL via INTRATHECAL

## 2017-01-20 MED ORDER — LIDOCAINE HCL (PF) 1 % IJ SOLN
INTRAMUSCULAR | Status: AC
  Filled 2017-01-20: qty 5

## 2017-01-20 MED ORDER — ONDANSETRON HCL 4 MG/2ML IJ SOLN: 4.0000 mg | Freq: Four times a day (QID) | INTRAMUSCULAR | Status: DC | PRN

## 2017-01-20 MED ORDER — OXYCODONE HCL 5 MG PO TABS: 5.0000 mg | ORAL_TABLET | ORAL | Status: DC | PRN

## 2017-01-20 MED ORDER — DIAZEPAM 5 MG PO TABS
10.0000 mg | ORAL_TABLET | Freq: Once | ORAL | Status: AC
  Administered 2017-01-20: 10 mg via ORAL
  Filled 2017-01-20: qty 2

## 2017-01-20 NOTE — Discharge Instructions (Signed)
Myelogram, Care After These instructions give you information about caring for yourself after your procedure. Your doctor may also give you more specific instructions. Call your doctor if you have any problems or questions after your procedure. Follow these instructions at home:  Drink enough fluid to keep your pee (urine) clear or pale yellow.  Rest as told by your doctor.  Lie flat with your head slightly raised (elevated).  Do not bend, lift, or do any hard activities for 24-48 hours or as told by your doctor.  Take over-the-counter and prescription medicines only as told by your doctor.  Take care of and remove your bandage (dressing) as told by your doctor.  Bathe or shower as told by your doctor. Contact a health care provider if:  You have a fever.  You have a headache that lasts longer than 24 hours.  You feel sick to your stomach (nauseous).  You throw up (vomit).  Your neck is stiff.  Your legs feel numb.  You cannot pee.  You cannot poop (have a bowel movement).  You have a rash.  You are itchy or sneezing. Get help right away if:  You have new symptoms or your symptoms get worse.  You have a seizure.  You have trouble breathing. This information is not intended to replace advice given to you by your health care provider. Make sure you discuss any questions you have with your health care provider. Document Released: 10/17/2007 Document Revised: 09/07/2015 Document Reviewed: 10/20/2014 Elsevier Interactive Patient Education  2018 [place] and Lumbar Puncture Discharge Instructions  1. Go home and rest quietly for the next 24 hours.  It is important to lie flat for the next 24 hours.  Get up only to go to the restroom.  You may lie in the bed or on a couch on your back, your stomach, your left side or your right side.  You may have one pillow under your head.  You may have  pillows between your knees while you are on your side or under your knees while you are on your back.  2. DO NOT drive today.  Recline the seat as far back as it will go, while still wearing your seat belt, on the way home.  3. You may get up to go to the bathroom as needed.  You may sit up for 10 minutes to eat.  You may resume your normal diet and medications unless otherwise indicated.  4. The incidence of headache, nausea, or vomiting is about 5% (one in 20 patients).  If you develop a headache, lie flat and drink plenty of fluids until the headache goes away.  Caffeinated beverages may be helpful.  If you develop severe nausea and vomiting or a headache that does not go away with flat bed rest, call [phone].  5. You may resume normal activities after your 24 hours of bed rest is over; however, do not exert yourself strongly or do any heavy lifting tomorrow.  6. Call your physician for a follow-up appointment.  The results of your myelogram will be sent directly to your physician by the following day.  7. If you have any questions or if complications develop after you arrive home, please call [phone].  Discharge instructions have been explained to the patient.  The  patient, or the person responsible for the patient, fully understands these instructions.

## 2017-01-22 ENCOUNTER — Other Ambulatory Visit (HOSPITAL_COMMUNITY): Payer: Self-pay | Admitting: Oncology

## 2017-01-22 DIAGNOSIS — I2602 Saddle embolus of pulmonary artery with acute cor pulmonale: Secondary | ICD-10-CM

## 2017-01-22 DIAGNOSIS — R0781 Pleurodynia: Secondary | ICD-10-CM

## 2017-01-22 DIAGNOSIS — M5410 Radiculopathy, site unspecified: Secondary | ICD-10-CM

## 2017-01-31 ENCOUNTER — Encounter: Payer: Self-pay | Admitting: Family Medicine

## 2017-01-31 ENCOUNTER — Telehealth: Payer: Self-pay | Admitting: Family Medicine

## 2017-01-31 ENCOUNTER — Other Ambulatory Visit: Payer: Self-pay

## 2017-01-31 ENCOUNTER — Ambulatory Visit: Payer: Medicaid Other | Admitting: Family Medicine

## 2017-01-31 VITALS — BP 192/108 | HR 108 | Temp 98.6°F | Resp 18 | Ht 70.0 in | Wt 375.0 lb

## 2017-01-31 DIAGNOSIS — I10 Essential (primary) hypertension: Secondary | ICD-10-CM

## 2017-01-31 DIAGNOSIS — G8929 Other chronic pain: Secondary | ICD-10-CM

## 2017-01-31 DIAGNOSIS — M5410 Radiculopathy, site unspecified: Secondary | ICD-10-CM

## 2017-01-31 DIAGNOSIS — Z23 Encounter for immunization: Secondary | ICD-10-CM

## 2017-01-31 DIAGNOSIS — M545 Low back pain, unspecified: Secondary | ICD-10-CM

## 2017-01-31 DIAGNOSIS — I2602 Saddle embolus of pulmonary artery with acute cor pulmonale: Secondary | ICD-10-CM

## 2017-01-31 DIAGNOSIS — D508 Other iron deficiency anemias: Secondary | ICD-10-CM

## 2017-01-31 DIAGNOSIS — R739 Hyperglycemia, unspecified: Secondary | ICD-10-CM

## 2017-01-31 DIAGNOSIS — Z113 Encounter for screening for infections with a predominantly sexual mode of transmission: Secondary | ICD-10-CM

## 2017-01-31 MED ORDER — AMLODIPINE BESYLATE 10 MG PO TABS: 10.0000 mg | ORAL_TABLET | Freq: Every day | ORAL | 3 refills | Status: DC

## 2017-01-31 MED ORDER — OXYCODONE HCL 10 MG PO TABS: 10.0000 mg | ORAL_TABLET | Freq: Four times a day (QID) | ORAL | 0 refills | Status: DC | PRN

## 2017-01-31 MED ORDER — OXYCODONE HCL 10 MG PO TABS: 10.0000 mg | ORAL_TABLET | Freq: Three times a day (TID) | ORAL | 0 refills | Status: DC | PRN

## 2017-01-31 MED ORDER — AMLODIPINE BESYLATE 10 MG PO TABS
10.0000 mg | ORAL_TABLET | Freq: Every day | ORAL | 3 refills | Status: DC
Start: 2017-01-31 — End: 2017-01-31

## 2017-01-31 NOTE — Progress Notes (Signed)
Patient ID: [name] [name], female    DOB: 1975-05-08, 42 y.o.   MRN: [id number]  Chief Complaint  Patient presents with  . Hyperlipidemia  . Back Pain  . Anemia  . Sleep Apnea    Allergies Sulfa antibiotics  Subjective:   [name] [name] is a 42 y.o. female who presents to [facility] today.  HPI [name] [name] presents as a new patient to establish care.  She is followed at the [facility] for clotting disorder status post massive saddle pulmonary embolus.  She reports she has an upcoming appointment at the cancer center on February 1 to discuss her anticoagulation.  She has been on Lovenox injections for the past 2 years since the birth of her daughter.  She is interested in switching back to either Xarelto or Coumadin.  She is also been managed there for iron deficiency anemia and has received iron infusions in the past.    Has a history of chronic low back and has been followed by spine surgeon.  Reports that he would like for her to have surgery on her back but reports she has a 52-year-old daughter and is not sure that she can do the surgery at this time due to the fact that she would need to be pretty immobile and no lifting for 6 weeks.  She reports that she has been dealing with his chronic back pain for quite some time.  Is currently out of work due to her back pain.  Reports that her back would require her to miss multiple days of work.  Reports when her back pain is terrible she has to stay in the bed for several days at a time.  Reports that because of this she ended up needing to move in with her parents so that they could help take care of her 78-year-old daughter.  Has not yet filed for disability.  Reports that back hurts with standing and moving.  Reports that she is not interested in getting surgery at this time but would consider other interventional pain relief methods.  Her CT scan/lumbar myelogram was reviewed today which was performed in  December.  Results indicate:  IMPRESSION: 1. Severe facet arthrosis at L4-5 with grade 1 anterolisthesis which increases with standing and results in severe spinal stenosis. Severe right neural foraminal stenosis as well. 2. Borderline spinal stenosis and mild right and moderate left foraminal stenosis at L3-4. 3. Mild foraminal narrowing at L5-S1.  Patient reports that her pain is very severe.  She reports the pain is in the center of her back and radiates to both sides and down to her upper thighs.  She reports the pain is severe in quality.  She does take Percocet 10 mg twice a day.  She has been on this medication for a long time and has been receiving these prescriptions by hematology/oncology.  She would like a refill today.  She reports that her blood pressure is so high today because she has been out of her medication and is in a great deal of pain.  She denies any weakness in her extremities.  She reports she is urinating normal.  No urinary incontinence.  She reports she has had high blood pressure for many years.  Reports she is currently on one medication.  Reports she used to be on another medication but then it was stopped by her GYN.  She reports she is received quite a bit of care from her gynecologist  office and needed to get a primary care physician to manage her blood pressure and other medical problems.  She has not had a diagnosis of diabetes in the past but has had elevated sugars.  She reports that she understands she needs to lose weight before improvement in her health status.  Is also followed by Dr. Stefan Church, sleep medicine.  Reports she uses her CPAP setting of 11 each night.  Reports that she did take her blood pressure medication today.  Denies any chest pain, shortness of breath, changes in vision, headache.  She reports her blood pressure is been very high for a long time.  Patient reports that she does smoke cigarettes and understands that she needs to quit.  She  is not motivated to quit smoking at this time.   Hypertension  This is a chronic problem. The current episode started more than 1 year ago. The problem is uncontrolled. Pertinent negatives include no anxiety, blurred vision, chest pain, headaches, orthopnea, palpitations, peripheral edema, shortness of breath or sweats. There are no associated agents to hypertension. Risk factors for coronary artery disease include family history, obesity, sedentary lifestyle and smoking/tobacco exposure. Past treatments include diuretics. The current treatment provides mild improvement. Compliance problems include diet and exercise.  There is no history of angina, kidney disease, CAD/MI, CVA, heart failure, left ventricular hypertrophy, PVD or retinopathy.    Past Medical History:  Diagnosis Date  . Abdominal cramps 08/30/2015  . Anemia   . Blood transfusion without reported diagnosis   . Closed left ankle fracture    August 30 2012  . Clotting disorder ([place])   . DVT (deep venous thrombosis) ([facility])    L leg  . Fibroid tumor   . Hidradenitis   . High blood pressure   . Hypertension   . Obesity   . OSA on CPAP   . Oxygen deficiency   . Pregnancy    09/20/14 17 weeks  . Pulmonary embolism ([place]) 04/2013  . Sleep apnea   . Tobacco use disorder     Past Surgical History:  Procedure Laterality Date  . ABSCESS DRAINAGE Bilateral 01/10/15  . CYST REMOVAL TRUNK    . IVC FILTER PLACEMENT ([facility])      Family History  Problem Relation Age of Onset  . Hypertension Mother   . Diabetes Paternal Aunt   . Diabetes Paternal Uncle   . Other Father        blood clots  . Cancer Maternal Grandmother   . Heart attack Paternal Grandmother      Social History   Socioeconomic History  . Marital status: Widowed    Spouse name: None  . Number of children: None  . Years of education: None  . Highest education level: None  Social Needs  . Financial resource strain: None  . Food insecurity - worry: None  .  Food insecurity - inability: None  . Transportation needs - medical: None  . Transportation needs - non-medical: None  Occupational History  . None  Tobacco Use  . Smoking status: Current Some Day Smoker    Packs/day: 0.25    Years: 18.00    Pack years: 4.50    Types: Cigarettes  . Smokeless tobacco: Never Used  . Tobacco comment: smokes 3 cig when she smokes  Substance and Sexual Activity  . Alcohol use: Yes    Alcohol/week: 1.2 oz    Types: 2 Cans of beer per week  . Drug use: No  .  Sexual activity: Not Currently    Birth control/protection: Pill  Other Topics Concern  . 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    Has a 41 year old [place].   Live with parents.   Was working 5 days a weeks.   Not working right now.    Attends church.    Current Outpatient Medications on File Prior to Visit  Medication Sig Dispense Refill  . Acetaminophen-Caff-Pyrilamine (MIDOL MAX ST MENSTRUAL) 500-60-15 MG TABS Take 2 tablets every 8 (eight) hours as needed by mouth (for pain.).    Marland Kitchen enoxaparin (LOVENOX) 100 MG/ML injection Inject 1.7 mLs (170 mg total) into the skin every 12 (twelve) hours. 60 Syringe 5  . norethindrone (MICRONOR,CAMILA,ERRIN) 0.35 MG tablet TAKE ONE TABLET BY MOUTH ONCE DAILY 28 tablet 3  . triamterene-hydrochlorothiazide (MAXZIDE-25) 37.5-25 MG tablet Take 1 tablet by mouth daily.     Current Facility-Administered Medications on File Prior to Visit  Medication Dose Route Frequency Provider Last Rate Last Dose  . 0.9 %  sodium chloride infusion   Intravenous Continuous [facility], PA-C 20 mL/hr at 12/22/15 1430     Review of Systems  Eyes: Negative for blurred vision.  Respiratory: Negative for shortness of breath.   Cardiovascular: Negative for chest pain, palpitations and orthopnea.  Neurological: Negative for headaches.     Objective:   BP (!) 192/108 (BP Location: Left Arm, Patient Position: Sitting,  Cuff Size: Normal)   Pulse (!) 108   Temp 98.6 F (37 C) (Temporal)   Resp 18   Ht 5\' 10"  (1.778 m)   Wt (!) 375 lb (170.1 kg)   LMP 01/03/2017 (Exact Date)   SpO2 95%   BMI 53.81 kg/m   Physical Exam  Constitutional: She is oriented to person, place, and time. She appears well-developed and well-nourished.  Morbidly obese African-American female.  HENT:  Head: Normocephalic and atraumatic.  Eyes: EOM are normal. Pupils are equal, round, and reactive to light.  Neck: Normal range of motion. Neck supple. No JVD present.  Cardiovascular: Normal rate and regular rhythm.  Pulmonary/Chest: Effort normal and breath sounds normal. No respiratory distress.  Abdominal: Soft. Bowel sounds are normal. There is no tenderness.  Musculoskeletal: Normal range of motion.  Neurological: She is alert and oriented to person, place, and time.  Skin: Skin is warm and dry.  Multiple circular areas of ecchymosis, approximate 3-5 cm on abdomen, at sites of Lovenox injection.  Psychiatric: Her behavior is normal. Judgment and thought content normal.  Mood slightly dysthymic.  Affect congruent with mood.  Pleasant with appropriate dress and grooming.  No distracting behaviors or mechanisms.      Assessment and Plan  1. Benign essential hypertension Long discussion today with patient regarding her blood pressure.  We did discuss that her blood pressure was elevated today to a level that could cause her to have a stroke.  We discussed that because she is on anticoagulation that it is even more imperative for her blood pressure to be controlled so as to reduce her risk of hemorrhagic stroke.  She denies any current headache, chest pain, vision changes.  She reports that she will leave our office today and get her blood pressure medication at the drugstore where it has been sent.  We will start Norvasc 10 mg 1 p.o. daily.  She will follow-up in 2 weeks or sooner if needed.  We did discuss  that it is likely that  she will need a few additional medications to get her blood pressure under control.  She reported that she felt like her blood pressure was elevated today more than usual because she was in pain.  We will plan on calculating patient's ASCVD risk after obtaining her lipid panel. - amLODipine (NORVASC) 10 MG tablet; Take 1 tablet (10 mg total) by mouth daily.  Dispense: 90 tablet; Refill: 3 - Basic metabolic panel - Lipid panel  2. Other iron deficiency anemia Follow-up with hematology regarding iron deficiency anemia.  Patient reports that she has received IV iron infusions but has never been on oral iron.  In review of her ferritin studies, she did have very low iron studies.  However I do think that it would be beneficial for patient to be on oral iron therapy.  However, she will discuss this when she follows up with hematology.  3. Immunization due  - Flu Vaccine QUAD 6+ mos PF IM (Fluarix Quad PF)  4. Chronic bilateral low back pain without sciatica Long discussion with patient today regarding back pain.  She will have records sent to our office from her neurosurgical/spine specialist.  I have reviewed the CT/myelogram which was in the chart.  I did give her a refill of her Percocet today that she has been taking.  She has been compliant with her medication use and has been receiving it from the cancer center.  I do believe at this time that even though she is getting some pain relief with the medication that she needs a better plan for her pain control.  At this time I will refer her to pain clinic for medication management and possible interventional pain relief. - Ambulatory referral to Pain Clinic - Oxycodone HCl 10 MG TABS; Take 1 tablet (10 mg total) by mouth every 6 (six) hours as needed.  Dispense: 45 tablet; Refill: 0 She understands habit-forming risks of narcotic medication.  She also understands to please keep this medication put up at her home where it cannot be reached by her  65-year-old daughter.  We discussed this and she voiced understanding. 5. Acute saddle pulmonary embolism with acute cor pulmonale ([place]) Patient with lifelong need for continued anticoagulation.  I defer anticoagulation choice to hematologist.  I told patient she needs to follow-up with him in February to make the decision as to whether she can switch to Xarelto or Coumadin.  She is agreed to continue her Lovenox until she follows up with hematology.   6. Screen for STD (sexually transmitted disease) And review of health maintenance today this was recommended for patient.  She reports she would like to have this testing performed. - HIV antibody - RPR - Hepatitis panel, acute  7. Hyperglycemia Check hemoglobin A1c today due to patient's history of hyperglycemia, hypertension, and morbid obesity. - Hemoglobin A1c -The patient is asked to make an attempt to improve diet and exercise patterns to aid in medical management of this problem.   8.  Tobacco Abuse The 5 A's Model for treating Tobacco Use and Dependence was used today. I have identified and documented tobacco use status for this patient. I have urged the patient to quit tobacco use. At this time, the patient is unwilling and not ready to attempt to quit. I have provided patient with information regarding risks, cessation techniques, and interventions that might increase future attempts to quit smoking. I will plan on again addressing tobacco dependence at the next visit. We did  discuss today specifically that her tobacco use increases her blood pressure and increases her risk for recurrent thromboembolism. Return in about 2 weeks (around 02/14/2017) for BP. Caren Macadam, MD 01/31/2017

## 2017-01-31 NOTE — Telephone Encounter (Signed)
Patient informed of message below, verbalized understanding.  

## 2017-01-31 NOTE — Telephone Encounter (Signed)
Please make sure has been canceled and not picked up already at [facility].

## 2017-01-31 NOTE — Telephone Encounter (Signed)
Medications have been canceled by [facility].

## 2017-01-31 NOTE — Telephone Encounter (Signed)
Please advise patient that the prescriptions have been sent into [place].

## 2017-01-31 NOTE — Telephone Encounter (Signed)
Can you re-send to [place] and I will call [facility] and cancel there? I resent BP med. Just needs oxycodone.

## 2017-01-31 NOTE — Telephone Encounter (Signed)
Patient called in to request all her medications that she was given today be sent to [facility] Cb#: [phone]

## 2017-02-03 LAB — LIPID PANEL
CHOL/HDL RATIO: 3.1 (calc) (ref <5.0)
Cholesterol: 159 mg/dL (ref <200)
HDL: 51 mg/dL (ref >50)
LDL CHOLESTEROL (CALC): 92 mg/dL
Non-HDL Cholesterol (Calc): 108 mg/dL (calc) (ref <130)
TRIGLYCERIDES: 73 mg/dL (ref <150)

## 2017-02-03 LAB — HEPATITIS PANEL, ACUTE
HEP B C IGM: NONREACTIVE
Hep A IgM: NONREACTIVE
Hepatitis B Surface Ag: NONREACTIVE
Hepatitis C Ab: NONREACTIVE
SIGNAL TO CUT-OFF: 0.18 (ref <1.00)

## 2017-02-03 LAB — BASIC METABOLIC PANEL
BUN: 11 mg/dL (ref 7–25)
CO2: 28 mmol/L (ref 20–32)
CREATININE: 0.74 mg/dL (ref 0.50–1.10)
Calcium: 8.9 mg/dL (ref 8.6–10.2)
Chloride: 106 mmol/L (ref 98–110)
GLUCOSE: 92 mg/dL (ref 65–99)
Potassium: 4 mmol/L (ref 3.5–5.3)
SODIUM: 141 mmol/L (ref 135–146)

## 2017-02-03 LAB — RPR: RPR: NONREACTIVE

## 2017-02-03 LAB — HEMOGLOBIN A1C
EAG (MMOL/L): 5.8 (calc)
Hgb A1c MFr Bld: 5.3 % of total Hgb (ref <5.7)
MEAN PLASMA GLUCOSE: 105 (calc)

## 2017-02-03 LAB — HIV ANTIBODY (ROUTINE TESTING W REFLEX): HIV 1&2 Ab, 4th Generation: NONREACTIVE

## 2017-02-05 ENCOUNTER — Encounter: Payer: Self-pay | Admitting: Family Medicine

## 2017-02-06 ENCOUNTER — Encounter: Payer: Self-pay | Admitting: Family Medicine

## 2017-02-11 ENCOUNTER — Encounter: Payer: Self-pay | Admitting: Emergency Medicine

## 2017-02-11 ENCOUNTER — Ambulatory Visit: Payer: Medicaid Other | Admitting: Emergency Medicine

## 2017-02-11 DIAGNOSIS — G4733 Obstructive sleep apnea (adult) (pediatric): Secondary | ICD-10-CM | POA: Diagnosis not present

## 2017-02-11 DIAGNOSIS — I2602 Saddle embolus of pulmonary artery with acute cor pulmonale: Secondary | ICD-10-CM

## 2017-02-11 DIAGNOSIS — F17200 Nicotine dependence, unspecified, uncomplicated: Secondary | ICD-10-CM

## 2017-02-11 NOTE — Assessment & Plan Note (Signed)
Good compliance confirmed with her CPAP.  Good clinical response.

## 2017-02-11 NOTE — Assessment & Plan Note (Signed)
Currently on enoxaparin.  She is planning to speak to hematology about changing to [place].  I will plan to repeat her echocardiogram next year to assess her pulmonary pressures now that her pulmonary embolism has been treated and her sleep apnea is treated.

## 2017-02-11 NOTE — Patient Instructions (Signed)
Continue to use your CPAP every night as you have been doing Congratulations on decreasing your cigarettes.  You should set a goal of trying to stop completely.  Please let us know if we can help you accomplish this. Follow-up with hematology as planned to see if you can change enoxaparin to an alternative. Follow with Dr Lamonte Sakai in 1 year or sooner if you have any problems

## 2017-02-11 NOTE — Progress Notes (Signed)
Subjective:    Patient ID: [name] [name], female    DOB: July 24, 1975, 42 y.o.   MRN: [id number]  HPI 42 yo smoker, HTN, obesity, admission for PE with associated PAH and RV dilation. Treated with xarelto.  She has a hx of dyspnea even preceding the PE. She is a bit better, but still some with exertion.  She snores, has some witnessed apneas and jerks herself awake. She has L>R LE edema. No cough or wheeze.   ROV 03/11/16 -- this is a follow-up visit for patient with a history of pulmonary embolism, obstructive sleep apnea. Also with a history of tobacco abuse. She underwent a split-night sleep study on 12/21/15. This study showed severe obstructive sleep apnea with an AHI 77.5 per hour. Optimal CPAP therapy was 11 cm water. She is now on enoxaparin. No complaints. She is enthusiastic about starting CPAp therapy.   ROV 02/11/17 --patient has a history of obesity, hypertension, pulmonary embolism with associated secondary pulmonary hypertension and RV dilation for which she was treated with anticoagulation.  Also with obstructive sleep apnea.  She has an IVC filter in place and the plan is been for her to stay on anticoagulation. She is doing very well with wearing her her CPAP, nasal pillows.  Compliance data shows that she uses it 93% of the time for greater than 4 hours.  Better energy, no accidental naps. She on enoxaparin bid. Considering another change to [place] soon, following with hematology. She has cut down cigarettes to 3-4 a day.    Review of Systems  Constitutional: Negative for fever and unexpected weight change.  HENT: Negative for congestion, dental problem, ear pain, nosebleeds, postnasal drip, rhinorrhea, sinus pressure, sneezing, sore throat and trouble swallowing.   Eyes: Negative for redness and itching.  Respiratory: Negative for cough, chest tightness, shortness of breath and wheezing.   Cardiovascular: Negative for chest pain, palpitations and leg swelling.  Gastrointestinal:  Negative for abdominal distention, nausea and vomiting.  Genitourinary: Negative for dysuria.  Musculoskeletal: Negative for joint swelling.  Skin: Negative for rash.  Hematological: Does not bruise/bleed easily.  Psychiatric/Behavioral: Negative for dysphoric mood. The patient is not nervous/anxious.       Objective:   Physical Exam Vitals:   02/11/17 1552  BP: (!) 140/92  Pulse: 88  SpO2: 99%  Weight: (!) 364 lb 9.6 oz (165.4 kg)  Height: 5\' 10"  (1.778 m)   Gen: Pleasant, obese woman, in no distress,  normal affect  ENT: No lesions,  mouth clear,  oropharynx clear, no postnasal drip  Neck: No JVD, no TMG, no carotid bruits  Lungs: No use of accessory muscles, clear without rales or rhonchi  Cardiovascular: RRR, heart sounds normal, no murmur or gallops, no peripheral edema  Musculoskeletal: No deformities, no cyanosis or clubbing, no calf tenderness  Neuro: alert, non focal  Skin: Warm, no lesions or rashes    04/27/13 --  COMPARISON: None.  FINDINGS:  A large saddle embolus is appreciated. Extending from the distal  main pulmonary artery and to the left and right pulmonary arteries  with extension into the left upper lobe, left lower lobe and lingula  branches. There is right upper lobe, right middle lobe and right  lower lobe extension. Areas of subsegmental extension appreciated.  RV LV ratio 1.65. There are areas of intraventricular septal bowing  to the right.  The thoracic inlet is unremarkable.  There is no evidence of mediastinal masses or adenopathy.  The lungs are clear.  Visualized upper abdominal viscera are unremarkable.  Review of the MIP images confirms the above findings.  IMPRESSION:  Positive for acute PE with CTevidence of right heart strain (RV/LV  Ratio = 1.65 ) consistent with at least submassive (intermediate  risk) PE. The presence of right heart strain has been associated  with an increased risk of morbidity and mortality.    04/28/13  --  Study Conclusions - Study data: Technically difficult study. - Procedure narrative: Transthoracic echocardiography. Image quality was adequate. The study was technically difficult, as a result of body habitus. - Left ventricle: The cavity size was normal. Wall thickness was increased in a pattern of moderate LVH. Systolic function was vigorous. The estimated ejection fraction was in the range of 65% to 70%. Wall motion was normal; there were no regional wall motion abnormalities. Doppler parameters are consistent with abnormal left ventricular relaxation (grade 1 diastolic dysfunction). - Aortic valve: Valve area: 2.01cm^2(VTI). Valve area: 2.11cm^2 (Vmax). - Right ventricle: There is systolic flattening of the interventricular septum consistent with RV pressure overload. The cavity size was severely dilated. Systolic function was mildly reduced. - Pulmonary arteries: Systolic pressure was moderately increased. PA peak pressure: 65mm Hg (S).      Assessment & Plan:  Tobacco use disorder Discussed cessation with her today  Obstructive sleep apnea Good compliance confirmed with her CPAP.  Good clinical response.  Saddle embolus of pulmonary artery with acute cor pulmonale Currently on enoxaparin.  She is planning to speak to hematology about changing to [place].  I will plan to repeat her echocardiogram next year to assess her pulmonary pressures now that her pulmonary embolism has been treated and her sleep apnea is treated.  Baltazar Apo, MD, PhD 02/11/2017, 4:33 PM [facility] [phone] or if no answer [phone]

## 2017-02-11 NOTE — Assessment & Plan Note (Signed)
Discussed cessation with her today 

## 2017-02-14 ENCOUNTER — Other Ambulatory Visit: Payer: Self-pay | Admitting: Family Medicine

## 2017-02-14 ENCOUNTER — Ambulatory Visit (INDEPENDENT_AMBULATORY_CARE_PROVIDER_SITE_OTHER): Payer: Medicaid Other | Admitting: Family Medicine

## 2017-02-14 ENCOUNTER — Other Ambulatory Visit: Payer: Self-pay

## 2017-02-14 ENCOUNTER — Encounter: Payer: Self-pay | Admitting: Family Medicine

## 2017-02-14 VITALS — BP 150/94 | HR 92 | Temp 98.9°F | Resp 17 | Ht 70.0 in | Wt 370.2 lb

## 2017-02-14 DIAGNOSIS — Z1231 Encounter for screening mammogram for malignant neoplasm of breast: Secondary | ICD-10-CM

## 2017-02-14 DIAGNOSIS — G8929 Other chronic pain: Secondary | ICD-10-CM | POA: Diagnosis not present

## 2017-02-14 DIAGNOSIS — M545 Low back pain: Secondary | ICD-10-CM

## 2017-02-14 DIAGNOSIS — M5410 Radiculopathy, site unspecified: Secondary | ICD-10-CM

## 2017-02-14 DIAGNOSIS — I10 Essential (primary) hypertension: Secondary | ICD-10-CM

## 2017-02-14 DIAGNOSIS — Z1239 Encounter for other screening for malignant neoplasm of breast: Secondary | ICD-10-CM

## 2017-02-14 MED ORDER — OXYCODONE HCL 10 MG PO TABS: 10.0000 mg | ORAL_TABLET | Freq: Three times a day (TID) | ORAL | 0 refills | Status: DC | PRN

## 2017-02-14 MED ORDER — LISINOPRIL 20 MG PO TABS: 20.0000 mg | ORAL_TABLET | Freq: Every day | ORAL | 1 refills | Status: DC

## 2017-02-14 NOTE — Progress Notes (Signed)
Patient ID: [name] [name], female    DOB: June 23, 1975, 42 y.o.   MRN: [id number]  Chief Complaint  Patient presents with  . Follow-up    Allergies Sulfa antibiotics  Subjective:   [name] [name] is a 42 y.o. female who presents to [facility] today.  HPI [name] [name] is here today for follow-up of her blood pressure.  She reports her blood pressure is been running better with the new medication.  She denies any side effects.  Denies any edema in her lower extremities.  She reports that since she was last seen here about 2 weeks ago that she has quit sodas and juice.she has lost 5 pounds.  She is working on cutting down on cigarette smoking.  She has been seen by her pulmonologist since the last visit in our office.  He followed up on her CPAP use.  He also talked with her about stopping smoking.  She reports that she is continuing to work on this.  She is not interested in medications to help with smoking cessation at this time.  She is planning on cutting down by 1-2 cigarettes a month.  She reports that after eating a big meal that she craves a cigarette.  She denies any chest pain, swelling in her extremities, or palpitations.  She has a follow-up with hematology on February 21, 2017.  She is still doing her Lovenox injections as directed.  She denies any pain in her lower extremities.  She is not having any abnormal bleeding or bruising.  She feels like her mood is better.  She is awaiting approval as a patient by the pain clinic.  She still has some of her Percocet medications left from when she was given them.  She reports that she tries to take them only once to twice a day.     Past Medical History:  Diagnosis Date  . Abdominal cramps 08/30/2015  . Anemia   . Blood transfusion without reported diagnosis   . Closed left ankle fracture    August 30 2012  . Clotting disorder ([place])   . DVT (deep venous thrombosis) ([facility])    L leg  . Fibroid tumor   . Hidradenitis   . High  blood pressure   . Hypertension   . Obesity   . OSA on CPAP   . Oxygen deficiency   . Pregnancy    09/20/14 17 weeks  . Pulmonary embolism ([place]) 04/2013  . Sleep apnea   . Tobacco use disorder     Past Surgical History:  Procedure Laterality Date  . ABSCESS DRAINAGE Bilateral 01/10/15  . CYST REMOVAL TRUNK    . IVC FILTER PLACEMENT ([facility])      Family History  Problem Relation Age of Onset  . Hypertension Mother   . Diabetes Paternal Aunt   . Diabetes Paternal Uncle   . Other Father        blood clots  . Cancer Maternal Grandmother   . Heart attack Paternal Grandmother      Social History   Socioeconomic History  . Marital status: Widowed    Spouse name: None  . Number of children: None  . Years of education: None  . Highest education level: None  Social Needs  . Financial resource strain: None  . Food insecurity - worry: None  . Food insecurity - inability: None  . Transportation needs - medical: None  . Transportation needs - non-medical: None  Occupational History  .  None  Tobacco Use  . Smoking status: Current Some Day Smoker    Packs/day: 0.25    Years: 18.00    Pack years: 4.50    Types: Cigarettes  . Smokeless tobacco: Never Used  . Tobacco comment: smokes 3 cig when she smokes  Substance and Sexual Activity  . Alcohol use: Yes    Alcohol/week: 1.2 oz    Types: 2 Cans of beer per week  . Drug use: No  . Sexual activity: Not Currently    Birth control/protection: Pill  Other Topics Concern  . 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    Has a 41 year old [name] [name].   Live with parents.   Was working 5 days a weeks.   Not working right now.    Attends church.    Current Outpatient Medications on File Prior to Visit  Medication Sig Dispense Refill  . Acetaminophen-Caff-Pyrilamine (MIDOL MAX ST MENSTRUAL) 500-60-15 MG TABS Take 2 tablets every 8 (eight) hours as needed by mouth (for pain.).      Marland Kitchen amLODipine (NORVASC) 10 MG tablet Take 1 tablet (10 mg total) by mouth daily. 90 tablet 3  . enoxaparin (LOVENOX) 100 MG/ML injection Inject 1.7 mLs (170 mg total) into the skin every 12 (twelve) hours. 60 Syringe 5  . norethindrone (MICRONOR,CAMILA,ERRIN) 0.35 MG tablet TAKE ONE TABLET BY MOUTH ONCE DAILY 28 tablet 3  . triamterene-hydrochlorothiazide (MAXZIDE-25) 37.5-25 MG tablet Take 1 tablet by mouth daily.     Current Facility-Administered Medications on File Prior to Visit  Medication Dose Route Frequency Provider Last Rate Last Dose  . 0.9 %  sodium chloride infusion   Intravenous Continuous Baird Cancer, PA-C 20 mL/hr at 12/22/15 1430      Review of Systems  Constitutional: Negative for activity change, appetite change and fever.  Eyes: Negative for visual disturbance.  Respiratory: Negative for cough, chest tightness and shortness of breath.   Cardiovascular: Negative for chest pain, palpitations and leg swelling.  Gastrointestinal: Negative for abdominal pain, nausea and vomiting.  Genitourinary: Negative for dysuria, frequency and urgency.  Musculoskeletal: Positive for back pain.  Neurological: Negative for dizziness, syncope and light-headedness.  Hematological: Negative for adenopathy.     Objective:   BP (!) 150/94 (BP Location: Left Arm, Patient Position: Sitting, Cuff Size: Normal)   Pulse 92   Temp 98.9 F (37.2 C) (Temporal)   Resp 17   Ht 5\' 10"  (1.778 m)   Wt (!) 370 lb 4 oz (167.9 kg)   LMP 02/02/2017   SpO2 97%   BMI 53.13 kg/m   Physical Exam  Constitutional: She appears well-developed and well-nourished.  Cardiovascular: Normal rate and regular rhythm.  Pulmonary/Chest: Effort normal and breath sounds normal.  Psychiatric: She has a normal mood and affect. Her behavior is normal. Judgment and thought content normal.  Vitals reviewed.    Assessment and Plan  1. Benign essential hypertension Add lisinopril 20 mg, 1 p.o. daily.  Continue  the rest of her blood pressure medications as directed. -Patient was congratulated on her 5 pound weight loss.  She is to continue to cut out unnecessary calories and continue to not drink sodas and sugar beverages. -She was asked to watch her salt intake that this could help with her blood pressure.  We will plan to recheck her blood pressure in 1 month.  Will check a BMP at her follow-up. - lisinopril (PRINIVIL,ZESTRIL)  20 MG tablet; Take 1 tablet (20 mg total) by mouth daily.  Dispense: 30 tablet; Refill: 1  2. Back pain with left-sided radiculopathy Refill was given today for patient.  She was told that she will need to get the rest of her refills from the pain clinic.  This should last her until she is seen at the pain clinic. - Oxycodone HCl 10 MG TABS; Take 1 tablet (10 mg total) by mouth every 8 (eight) hours as needed.  Dispense: 45 tablet; Refill: 0 Patient counseled in detail regarding the risks of medication. Told to call or return to clinic if develop any worrisome signs or symptoms. Patient voiced understanding.   3. Screening for breast cancer Order was counseled for mammogram.  Patient defers this test at this time.  No Follow-up on file. Caren Macadam, MD 02/14/2017

## 2017-02-14 NOTE — Patient Instructions (Signed)
Steps to Quit Smoking Smoking tobacco can be bad for your health. It can also affect almost every organ in your body. Smoking puts you and people around you at risk for many serious long-lasting (chronic) diseases. Quitting smoking is hard, but it is one of the best things that you can do for your health. It is never too late to quit. What are the benefits of quitting smoking? When you quit smoking, you lower your risk for getting serious diseases and conditions. They can include:  Lung cancer or lung disease.  Heart disease.  Stroke.  Heart attack.  Not being able to have children (infertility).  Weak bones (osteoporosis) and broken bones (fractures).  If you have coughing, wheezing, and shortness of breath, those symptoms may get better when you quit. You may also get sick less often. If you are pregnant, quitting smoking can help to lower your chances of having a baby of low birth weight. What can I do to help me quit smoking? Talk with your doctor about what can help you quit smoking. Some things you can do (strategies) include:  Quitting smoking totally, instead of slowly cutting back how much you smoke over a period of time.  Going to in-person counseling. You are more likely to quit if you go to many counseling sessions.  Using resources and support systems, such as: ? Online chats with a counselor. ? Phone quitlines. ? Printed self-help materials. ? Support groups or group counseling. ? Text messaging programs. ? Mobile phone apps or applications.  Taking medicines. Some of these medicines may have nicotine in them. If you are pregnant or breastfeeding, do not take any medicines to quit smoking unless your doctor says it is okay. Talk with your doctor about counseling or other things that can help you.  Talk with your doctor about using more than one strategy at the same time, such as taking medicines while you are also going to in-person counseling. This can help make  quitting easier. What things can I do to make it easier to quit? Quitting smoking might feel very hard at first, but there is a lot that you can do to make it easier. Take these steps:  Talk to your family and friends. Ask them to support and encourage you.  Call phone quitlines, reach out to support groups, or work with a counselor.  Ask people who smoke to not smoke around you.  Avoid places that make you want (trigger) to smoke, such as: ? Bars. ? Parties. ? Smoke-break areas at work.  Spend time with people who do not smoke.  Lower the stress in your life. Stress can make you want to smoke. Try these things to help your stress: ? Getting regular exercise. ? Deep-breathing exercises. ? Yoga. ? Meditating. ? Doing a body scan. To do this, close your eyes, focus on one area of your body at a time from head to toe, and notice which parts of your body are tense. Try to relax the muscles in those areas.  Download or buy apps on your mobile phone or tablet that can help you stick to your quit plan. There are many free apps, such as QuitGuide from the CDC (Centers for Disease Control and Prevention). You can find more support from smokefree.gov and other websites.  This information is not intended to replace advice given to you by your health care provider. Make sure you discuss any questions you have with your health care provider. Document Released: 11/03/2008 Document   Revised: 09/05/2015 Document Reviewed: 05/24/2014 Elsevier Interactive Patient Education  2018 [place] Eating Plan DASH stands for "Dietary Approaches to Stop Hypertension." The DASH eating plan is a healthy eating plan that has been shown to reduce high blood pressure (hypertension). It may also reduce your risk for type 2 diabetes, heart disease, and stroke. The DASH eating plan may also help with weight loss. What are tips for following this plan? General guidelines  Avoid eating more than 2,300 mg  (milligrams) of salt (sodium) a day. If you have hypertension, you may need to reduce your sodium intake to 1,500 mg a day.  Limit alcohol intake to no more than 1 drink a day for nonpregnant women and 2 drinks a day for men. One drink equals 12 oz of beer, 5 oz of wine, or 1 oz of hard liquor.  Work with your health care provider to maintain a healthy body weight or to lose weight. Ask what an ideal weight is for you.  Get at least 30 minutes of exercise that causes your heart to beat faster (aerobic exercise) most days of the week. Activities may include walking, swimming, or biking.  Work with your health care provider or diet and nutrition specialist (dietitian) to adjust your eating plan to your individual calorie needs. Reading food labels  Check food labels for the amount of sodium per serving. Choose foods with less than 5 percent of the Daily Value of sodium. Generally, foods with less than 300 mg of sodium per serving fit into this eating plan.  To find whole grains, look for the word "whole" as the first word in the ingredient list. Shopping  Buy products labeled as "low-sodium" or "no salt added."  Buy fresh foods. Avoid canned foods and premade or frozen meals. Cooking  Avoid adding salt when cooking. Use salt-free seasonings or herbs instead of table salt or sea salt. Check with your health care provider or pharmacist before using salt substitutes.  Do not fry foods. Cook foods using healthy methods such as baking, boiling, grilling, and broiling instead.  Cook with heart-healthy oils, such as olive, canola, soybean, or sunflower oil. Meal planning   Eat a balanced diet that includes: ? 5 or more servings of fruits and vegetables each day. At each meal, try to fill half of your plate with fruits and vegetables. ? Up to 6-8 servings of whole grains each day. ? Less than 6 oz of lean meat, poultry, or fish each day. A 3-oz serving of meat is about the same size as a deck  of cards. One egg equals 1 oz. ? 2 servings of low-fat dairy each day. ? A serving of nuts, seeds, or beans 5 times each week. ? Heart-healthy fats. Healthy fats called Omega-3 fatty acids are found in foods such as flaxseeds and coldwater fish, like sardines, salmon, and mackerel.  Limit how much you eat of the following: ? Canned or prepackaged foods. ? Food that is high in trans fat, such as fried foods. ? Food that is high in saturated fat, such as fatty meat. ? Sweets, desserts, sugary drinks, and other foods with added sugar. ? Full-fat dairy products.  Do not salt foods before eating.  Try to eat at least 2 vegetarian meals each week.  Eat more home-cooked food and less restaurant, buffet, and fast food.  When eating at a restaurant, ask that your food be prepared with less salt or no salt, if possible. What foods are recommended? The  items listed may not be a complete list. Talk with your dietitian about what dietary choices are best for you. Grains Whole-grain or whole-wheat bread. Whole-grain or whole-wheat pasta. Brown rice. [place]. Bulgur. Whole-grain and low-sodium cereals. Pita bread. Low-fat, low-sodium crackers. Whole-wheat flour tortillas. Vegetables Fresh or frozen vegetables (raw, steamed, roasted, or grilled). Low-sodium or reduced-sodium tomato and vegetable juice. Low-sodium or reduced-sodium tomato sauce and tomato paste. Low-sodium or reduced-sodium canned vegetables. Fruits All fresh, dried, or frozen fruit. Canned fruit in natural juice (without added sugar). Meat and other protein foods Skinless chicken or [place]. Ground chicken or [place]. Pork with fat trimmed off. Fish and seafood. Egg whites. Dried beans, peas, or lentils. Unsalted nuts, nut butters, and seeds. Unsalted canned beans. Lean cuts of beef with fat trimmed off. Low-sodium, lean deli meat. Dairy Low-fat (1%) or fat-free (skim) milk. Fat-free, low-fat, or reduced-fat cheeses. Nonfat,  low-sodium ricotta or cottage cheese. Low-fat or nonfat yogurt. Low-fat, low-sodium cheese. Fats and oils Soft margarine without trans fats. Vegetable oil. Low-fat, reduced-fat, or light mayonnaise and salad dressings (reduced-sodium). Canola, safflower, olive, soybean, and sunflower oils. Avocado. Seasoning and other foods Herbs. Spices. Seasoning mixes without salt. Unsalted popcorn and pretzels. Fat-free sweets. What foods are not recommended? The items listed may not be a complete list. Talk with your dietitian about what dietary choices are best for you. Grains Baked goods made with fat, such as croissants, muffins, or some breads. Dry pasta or rice meal packs. Vegetables Creamed or fried vegetables. Vegetables in a cheese sauce. Regular canned vegetables (not low-sodium or reduced-sodium). Regular canned tomato sauce and paste (not low-sodium or reduced-sodium). Regular tomato and vegetable juice (not low-sodium or reduced-sodium). Angie Fava. Olives. Fruits Canned fruit in a light or heavy syrup. Fried fruit. Fruit in cream or butter sauce. Meat and other protein foods Fatty cuts of meat. Ribs. Fried meat. Berniece Salines. Sausage. Bologna and other processed lunch meats. Salami. Fatback. Hotdogs. Bratwurst. Salted nuts and seeds. Canned beans with added salt. Canned or smoked fish. Whole eggs or egg yolks. Chicken or [place] with skin. Dairy Whole or 2% milk, cream, and half-and-half. Whole or full-fat cream cheese. Whole-fat or sweetened yogurt. Full-fat cheese. Nondairy creamers. Whipped toppings. Processed cheese and cheese spreads. Fats and oils Butter. Stick margarine. Lard. Shortening. Ghee. Bacon fat. Tropical oils, such as coconut, palm kernel, or palm oil. Seasoning and other foods Salted popcorn and pretzels. Onion salt, garlic salt, seasoned salt, table salt, and sea salt. Worcestershire sauce. Tartar sauce. Barbecue sauce. Teriyaki sauce. Soy sauce, including reduced-sodium. Steak sauce.  Canned and packaged gravies. Fish sauce. Oyster sauce. Cocktail sauce. Horseradish that you find on the shelf. Ketchup. Mustard. Meat flavorings and tenderizers. Bouillon cubes. Hot sauce and Tabasco sauce. Premade or packaged marinades. Premade or packaged taco seasonings. Relishes. Regular salad dressings. Where to find more information:  National Heart, Lung, and [place]: https://wilson-eaton.com/  American Heart Association: www.heart.org Summary  The DASH eating plan is a healthy eating plan that has been shown to reduce high blood pressure (hypertension). It may also reduce your risk for type 2 diabetes, heart disease, and stroke.  With the DASH eating plan, you should limit salt (sodium) intake to 2,300 mg a day. If you have hypertension, you may need to reduce your sodium intake to 1,500 mg a day.  When on the DASH eating plan, aim to eat more fresh fruits and vegetables, whole grains, lean proteins, low-fat dairy, and heart-healthy fats.  Work with your health care provider  or diet and nutrition specialist (dietitian) to adjust your eating plan to your individual calorie needs. This information is not intended to replace advice given to you by your health care provider. Make sure you discuss any questions you have with your health care provider. Document Released: 12/27/2010 Document Revised: 01/01/2016 Document Reviewed: 01/01/2016 Elsevier Interactive Patient Education  [employer].

## 2017-02-19 ENCOUNTER — Ambulatory Visit (HOSPITAL_COMMUNITY): Payer: Self-pay

## 2017-02-20 ENCOUNTER — Other Ambulatory Visit (HOSPITAL_COMMUNITY): Payer: Self-pay | Admitting: *Deleted

## 2017-02-20 DIAGNOSIS — D508 Other iron deficiency anemias: Secondary | ICD-10-CM

## 2017-02-21 ENCOUNTER — Ambulatory Visit (HOSPITAL_COMMUNITY): Payer: Self-pay | Admitting: Oncology

## 2017-02-21 ENCOUNTER — Inpatient Hospital Stay (HOSPITAL_COMMUNITY): Payer: Medicaid Other | Attending: Oncology

## 2017-02-21 DIAGNOSIS — D509 Iron deficiency anemia, unspecified: Secondary | ICD-10-CM | POA: Insufficient documentation

## 2017-02-21 DIAGNOSIS — M545 Low back pain: Secondary | ICD-10-CM | POA: Diagnosis not present

## 2017-02-21 DIAGNOSIS — D508 Other iron deficiency anemias: Secondary | ICD-10-CM

## 2017-02-21 DIAGNOSIS — G8929 Other chronic pain: Secondary | ICD-10-CM | POA: Diagnosis not present

## 2017-02-21 DIAGNOSIS — Z720 Tobacco use: Secondary | ICD-10-CM | POA: Diagnosis not present

## 2017-02-21 DIAGNOSIS — Z86711 Personal history of pulmonary embolism: Secondary | ICD-10-CM | POA: Insufficient documentation

## 2017-02-21 DIAGNOSIS — Z7901 Long term (current) use of anticoagulants: Secondary | ICD-10-CM | POA: Diagnosis not present

## 2017-02-21 LAB — IRON AND TIBC
Iron: 69 ug/dL (ref 28–170)
SATURATION RATIOS: 18 % (ref 10.4–31.8)
TIBC: 386 ug/dL (ref 250–450)
UIBC: 317 ug/dL

## 2017-02-21 LAB — COMPREHENSIVE METABOLIC PANEL
ALBUMIN: 3.8 g/dL (ref 3.5–5.0)
ALT: 14 U/L (ref 14–54)
AST: 16 U/L (ref 15–41)
Alkaline Phosphatase: 57 U/L (ref 38–126)
Anion gap: 10 (ref 5–15)
BUN: 19 mg/dL (ref 6–20)
CHLORIDE: 99 mmol/L — AB (ref 101–111)
CO2: 25 mmol/L (ref 22–32)
Calcium: 9.2 mg/dL (ref 8.9–10.3)
Creatinine, Ser: 0.91 mg/dL (ref 0.44–1.00)
GFR calc non Af Amer: >60 mL/min (ref >60)
GLUCOSE: 102 mg/dL — AB (ref 65–99)
Potassium: 4.3 mmol/L (ref 3.5–5.1)
SODIUM: 134 mmol/L — AB (ref 135–145)
Total Bilirubin: 0.5 mg/dL (ref 0.3–1.2)
Total Protein: 8.5 g/dL — ABNORMAL HIGH (ref 6.5–8.1)

## 2017-02-21 LAB — CBC WITH DIFFERENTIAL/PLATELET
BASOS ABS: 0 10*3/uL (ref 0.0–0.1)
BASOS PCT: 0 %
EOS ABS: 0.2 10*3/uL (ref 0.0–0.7)
EOS PCT: 3 %
HCT: 41.6 % (ref 36.0–46.0)
Hemoglobin: 12.9 g/dL (ref 12.0–15.0)
Lymphocytes Relative: 21 %
Lymphs Abs: 1.6 10*3/uL (ref 0.7–4.0)
MCH: 28 pg (ref 26.0–34.0)
MCHC: 31 g/dL (ref 30.0–36.0)
MCV: 90.4 fL (ref 78.0–100.0)
Monocytes Absolute: 0.5 10*3/uL (ref 0.1–1.0)
Monocytes Relative: 6 %
NEUTROS PCT: 70 %
Neutro Abs: 5.2 10*3/uL (ref 1.7–7.7)
PLATELETS: 283 10*3/uL (ref 150–400)
RBC: 4.6 MIL/uL (ref 3.87–5.11)
RDW: 15.7 % — ABNORMAL HIGH (ref 11.5–15.5)
WBC: 7.5 10*3/uL (ref 4.0–10.5)

## 2017-02-21 LAB — FERRITIN: FERRITIN: 27 ng/mL (ref 11–307)

## 2017-02-27 ENCOUNTER — Ambulatory Visit (HOSPITAL_COMMUNITY)
Admission: RE | Admit: 2017-02-27 | Discharge: 2017-02-27 | Disposition: A | Discharge status: Home / Self Care | Payer: Medicaid Other | Source: Ambulatory Visit | Attending: Family Medicine | Admitting: Family Medicine

## 2017-02-27 DIAGNOSIS — Z1231 Encounter for screening mammogram for malignant neoplasm of breast: Secondary | ICD-10-CM

## 2017-03-11 ENCOUNTER — Inpatient Hospital Stay (HOSPITAL_BASED_OUTPATIENT_CLINIC_OR_DEPARTMENT_OTHER): Payer: Medicaid Other | Admitting: Oncology

## 2017-03-11 ENCOUNTER — Inpatient Hospital Stay (HOSPITAL_COMMUNITY): Payer: Medicaid Other

## 2017-03-11 ENCOUNTER — Encounter (HOSPITAL_COMMUNITY): Payer: Self-pay | Admitting: Oncology

## 2017-03-11 VITALS — BP 162/68 | HR 86 | Temp 98.3°F | Resp 20 | Wt 372.0 lb

## 2017-03-11 DIAGNOSIS — M545 Low back pain: Secondary | ICD-10-CM

## 2017-03-11 DIAGNOSIS — I2602 Saddle embolus of pulmonary artery with acute cor pulmonale: Secondary | ICD-10-CM

## 2017-03-11 DIAGNOSIS — Z86711 Personal history of pulmonary embolism: Secondary | ICD-10-CM | POA: Diagnosis not present

## 2017-03-11 DIAGNOSIS — D500 Iron deficiency anemia secondary to blood loss (chronic): Secondary | ICD-10-CM

## 2017-03-11 DIAGNOSIS — G8929 Other chronic pain: Secondary | ICD-10-CM

## 2017-03-11 DIAGNOSIS — Z7901 Long term (current) use of anticoagulants: Secondary | ICD-10-CM | POA: Diagnosis not present

## 2017-03-11 DIAGNOSIS — D509 Iron deficiency anemia, unspecified: Secondary | ICD-10-CM | POA: Diagnosis not present

## 2017-03-11 LAB — CBC WITH DIFFERENTIAL/PLATELET
BASOS ABS: 0 10*3/uL (ref 0.0–0.1)
BASOS PCT: 0 %
EOS ABS: 0.2 10*3/uL (ref 0.0–0.7)
Eosinophils Relative: 3 %
HEMATOCRIT: 38.8 % (ref 36.0–46.0)
HEMOGLOBIN: 12.1 g/dL (ref 12.0–15.0)
Lymphocytes Relative: 23 %
Lymphs Abs: 1.4 10*3/uL (ref 0.7–4.0)
MCH: 28.1 pg (ref 26.0–34.0)
MCHC: 31.2 g/dL (ref 30.0–36.0)
MCV: 90.2 fL (ref 78.0–100.0)
MONOS PCT: 6 %
Monocytes Absolute: 0.4 10*3/uL (ref 0.1–1.0)
NEUTROS PCT: 68 %
Neutro Abs: 4.1 10*3/uL (ref 1.7–7.7)
Platelets: 303 10*3/uL (ref 150–400)
RBC: 4.3 MIL/uL (ref 3.87–5.11)
RDW: 15.9 % — ABNORMAL HIGH (ref 11.5–15.5)
WBC: 6 10*3/uL (ref 4.0–10.5)

## 2017-03-11 LAB — COMPREHENSIVE METABOLIC PANEL
ALBUMIN: 3.8 g/dL (ref 3.5–5.0)
ALT: 10 U/L — ABNORMAL LOW (ref 14–54)
ANION GAP: 10 (ref 5–15)
AST: 12 U/L — ABNORMAL LOW (ref 15–41)
Alkaline Phosphatase: 57 U/L (ref 38–126)
BILIRUBIN TOTAL: 0.3 mg/dL (ref 0.3–1.2)
BUN: 15 mg/dL (ref 6–20)
CALCIUM: 9.5 mg/dL (ref 8.9–10.3)
CO2: 26 mmol/L (ref 22–32)
Chloride: 102 mmol/L (ref 101–111)
Creatinine, Ser: 0.75 mg/dL (ref 0.44–1.00)
Glucose, Bld: 101 mg/dL — ABNORMAL HIGH (ref 65–99)
Potassium: 4.2 mmol/L (ref 3.5–5.1)
Sodium: 138 mmol/L (ref 135–145)
TOTAL PROTEIN: 8.6 g/dL — AB (ref 6.5–8.1)

## 2017-03-11 LAB — PROTIME-INR
INR: 1.01
Prothrombin Time: 13.2 seconds (ref 11.4–15.2)

## 2017-03-11 LAB — PREGNANCY, URINE: PREG TEST UR: NEGATIVE

## 2017-03-11 MED ORDER — WARFARIN SODIUM 5 MG PO TABS: 5.0000 mg | ORAL_TABLET | Freq: Every day | ORAL | 0 refills | Status: DC

## 2017-03-11 NOTE — Progress Notes (Signed)
[name] [name], [name] [name] [id number]  No diagnosis found.  CURRENT THERAPY: Lovenox daily and IV iron when indicated.  INTERVAL HISTORY: Patient returns for follow-up of her history of saddle PE, unprovoked with CT evidence of right strain and clot extending from the distal main pulmonary artery to the left and right pulmonary artery extension into the LUL, LLL and lingula branches. She was hospitalized for right heart strain and had IVC filter placement on 04/27/2013. She was hospitalized for right heart strain and had an IVC filter placement on 05/07/2013. She was seen at [facility] coagulation clinic in March 2017 for lifelong anticoagulation. Currently on Lovenox.  AND Iron deficiency anemia having needed IV iron replacement therapy.  Patient reports to me that she is doing well. Her appetite is 100% and energy levels are 75%. She complains of pain in her back and left leg does not have 10 in intensity. She was seen by a spine specialist but unfortunately they recommended surgery and she has declined at this time. She was told she would be unable to lift anything heavier than 10 pounds for 6-12 months. She currently has a 41-year-old son and his unable to have this restriction at this time. She was then referred to the pain clinic for possible injections whom she will see at the end of March. She additionally complains of numbness and tingling in her hands and fingers that is chronic and swelling in bilateral lower extremities which is also chronic.  Patient is interested in switching from Lovenox to an oral agent such as Coumadin.  She had a second opinion at [facility]: Patient Instructions - [name] Linsey, MD - 04/18/2015 9:20 AM EDT You should continue on blood thinners long term due to your previous unprovoked clots, especially given the severe nature of your lung clots. Similar to my recommendations at your initial visit, I would not recommend transitioning  to one of the new oral anticoagulants like Xarelto at this time. These medications have not been studied well in individuals with weights >~250 pounds, so we don't know how effective they are in this population. As such, if you wish to change from the Lovenox shots to an oral medication, I would recommend warfarin or Coumadin. You would start this and overlap it with Lovenox until your Coumadin levels are in the therapeutic range. This therapeutic range is an INR of 2-3. You will need to have someone locally to follow your INR levels and adjust Coumadin doses as needed. This is typically done by a primary care doctor, although Dr. Whitney [name] may be able to assist as well. You should be mindful of having stable intake of vitamin K containing foods while on Coumadin. You do not need to eliminate them from your diet but just eat a similar amount every week. Otherwise it will make keeping your Coumadin levels in the therapeutic range of difficult task. I anticipate having data in the next few years on the use of the new oral anticoagulants at the extremes of the body weight spectrum, so likely we can transitioning to one of these medicines in the future.  Regarding your left leg pain symptoms, I imagine it is due to something called post-thrombotic syndrome. The main treatment for this is to wear compression stockings. Try the over-the-counter type first and if no improvement then you can fill the prescription provided today for medical grade stockings. If ultimately you cannot tolerate the stockings or they  do not improve symptoms significantly, then there is no need to continue wearing them. I recommend that you avoid any estrogen containing systemic hormonal preparations. The safest hormonal option for birth control is the [place] or [place] IUDs. The next safest options are the progesterone only pills or other progesterone only preparations. Taking them while on anticoagulation is likely safe, but again I would  recommend avoiding estrogen containing preparations. If you were to become pregnant again in the future, you would need to be treated with Lovenox throughout the pregnancy like before.  For now, lets plan to see back here every 1-2 years to reassess the decision for long-term blood thinners and if there are alternative options available to you. I'm happy to see her sooner if new questions or problems.  Review of Systems  Constitutional: Negative.  Negative for chills, fever, malaise/fatigue and weight loss.  HENT: Negative for congestion and ear pain.   Eyes: Negative.  Negative for blurred vision and double vision.  Respiratory: Negative.  Negative for cough, sputum production and shortness of breath.   Cardiovascular: Negative.  Negative for chest pain, palpitations and leg swelling.  Gastrointestinal: Negative.  Negative for abdominal pain, constipation, diarrhea, nausea and vomiting.  Genitourinary: Negative for dysuria, frequency and urgency.  Musculoskeletal: Positive for back pain (Chronic). Negative for falls.  Skin: Negative.  Negative for rash.  Neurological: Negative.  Negative for weakness and headaches.  Endo/Heme/Allergies: Negative.  Does not bruise/bleed easily.  Psychiatric/Behavioral: Negative.  Negative for depression. The patient is not nervous/anxious and does not have insomnia.     Past Medical History:  Diagnosis Date  . Abdominal cramps 08/30/2015  . Anemia   . Blood transfusion without reported diagnosis   . Closed left ankle fracture    August 30 2012  . Clotting disorder ([place])   . DVT (deep venous thrombosis) (HCC)    L leg  . Fibroid tumor   . Hidradenitis   . High blood pressure   . Hypertension   . Obesity   . OSA on CPAP   . Oxygen deficiency   . Pregnancy    09/20/14 17 weeks  . Pulmonary embolism ([place]) 04/2013  . Sleep apnea   . Tobacco use disorder     Past Surgical History:  Procedure Laterality Date  . ABSCESS DRAINAGE Bilateral 01/10/15    . CYST REMOVAL TRUNK    . IVC FILTER PLACEMENT ([facility] HX)      Family History  Problem Relation Age of Onset  . Hypertension Mother   . Diabetes Paternal Aunt   . Diabetes Paternal Uncle   . Other Father        blood clots  . Cancer Maternal Grandmother   . Heart attack Paternal Grandmother     Social History   Socioeconomic History  . Marital status: Widowed    Spouse name: None  . Number of children: None  . Years of education: None  . Highest education level: None  Social Needs  . Financial resource strain: None  . Food insecurity - worry: None  . Food insecurity - inability: None  . Transportation needs - medical: None  . Transportation needs - non-medical: None  Occupational History  . None  Tobacco Use  . Smoking status: Current Some Day Smoker    Packs/day: 0.25    Years: 18.00    Pack years: 4.50    Types: Cigarettes  . Smokeless tobacco: Never Used  . Tobacco comment: smokes 3 cig when she  smokes  Substance and Sexual Activity  . Alcohol use: Yes    Alcohol/week: 1.2 oz    Types: 2 Cans of beer per week  . Drug use: No  . Sexual activity: Not Currently    Birth control/protection: Pill  Other Topics Concern  . 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    Has a 41 year old Allison.   Live with parents.   Was working 5 days a weeks.   Not working right now.    Attends church.      PHYSICAL EXAMINATION  ECOG PERFORMANCE STATUS: 1 - Symptomatic but completely ambulatory  Vitals:   03/11/17 1227  BP: (!) 162/68  Pulse: 86  Resp: 20  Temp: 98.3 F (36.8 C)  SpO2: 100%    Physical Exam  Constitutional: She is oriented to person, place, and time and well-developed, well-nourished, and in no distress. Vital signs are normal.  HENT:  Head: Normocephalic and atraumatic.  Eyes: Pupils are equal, round, and reactive to light.  Neck: Normal range of motion.  Cardiovascular: Normal rate, regular  rhythm and normal heart sounds.  No murmur heard. Pulmonary/Chest: Effort normal and breath sounds normal. She has no wheezes.  Abdominal: Soft. Normal appearance and bowel sounds are normal. She exhibits no distension. There is no tenderness.  Musculoskeletal: Normal range of motion. She exhibits no edema.  Neurological: She is alert and oriented to person, place, and time. Gait normal.  Skin: Skin is warm and dry. No rash noted.  Psychiatric: Mood, memory, affect and judgment normal.    LABORATORY DATA: CBC    Component Value Date/Time   WBC 7.5 02/21/2017 1331   RBC 4.60 02/21/2017 1331   HGB 12.9 02/21/2017 1331   HGB 12.5 01/18/2015 1653   HCT 41.6 02/21/2017 1331   HCT 37.7 01/18/2015 1653   PLT 283 02/21/2017 1331   PLT 214 01/18/2015 1653   MCV 90.4 02/21/2017 1331   MCV 95 01/18/2015 1653   MCH 28.0 02/21/2017 1331   MCHC 31.0 02/21/2017 1331   RDW 15.7 (H) 02/21/2017 1331   RDW 14.3 01/18/2015 1653   LYMPHSABS 1.6 02/21/2017 1331   MONOABS 0.5 02/21/2017 1331   EOSABS 0.2 02/21/2017 1331   BASOSABS 0.0 02/21/2017 1331      Chemistry      Component Value Date/Time   NA 134 (L) 02/21/2017 1331   NA 139 01/18/2015 1653   K 4.3 02/21/2017 1331   CL 99 (L) 02/21/2017 1331   CO2 25 02/21/2017 1331   BUN 19 02/21/2017 1331   BUN 9 01/18/2015 1653   CREATININE 0.91 02/21/2017 1331   CREATININE 0.74 01/31/2017 1119      Component Value Date/Time   CALCIUM 9.2 02/21/2017 1331   ALKPHOS 57 02/21/2017 1331   AST 16 02/21/2017 1331   ALT 14 02/21/2017 1331   BILITOT 0.5 02/21/2017 1331   BILITOT <0.2 01/18/2015 1653     Lab Results  Component Value Date   IRON 69 02/21/2017   TIBC 386 02/21/2017   FERRITIN 27 02/21/2017      PENDING LABS:   RADIOGRAPHIC STUDIES:  Mm Screening Breast Tomo Bilateral  Result Date: 02/27/2017 CLINICAL DATA:  Screening. EXAM: DIGITAL SCREENING BILATERAL MAMMOGRAM WITH TOMO AND CAD COMPARISON:  None. ACR Breast Density  Category b: There are scattered areas of fibroglandular density. FINDINGS: There are no findings suspicious for malignancy. Images were processed  with CAD. IMPRESSION: No mammographic evidence of malignancy. A result letter of this screening mammogram will be mailed directly to the patient. RECOMMENDATION: Screening mammogram in one year. (Code:SM-B-01Y) BI-RADS CATEGORY  1: Negative. Electronically Signed   By: Lajean Manes M.D.   On: 02/27/2017 14:04     PATHOLOGY:    ASSESSMENT AND PLAN:  No problem-specific Assessment & Plan notes found for this encounter.   ORDERS PLACED FOR THIS ENCOUNTER: No orders of the defined types were placed in this encounter.   MEDICATIONS PRESCRIBED THIS ENCOUNTER: No orders of the defined types were placed in this encounter.   THERAPY PLAN:   Patient wishes to bridge from Lovenox to oral Coumadin.  Need blood work for baseline prior to initiation of Coumadin. This includes CBC, CMET, PT and pregnancy test.  Will start patient off with 5 mg Coumadin daily. She will return on Monday for labs (PT/INR) X 3 weeks and then in one month for labs and MD assessment.. She is to continue Lovenox until PT/INR is therapeutic (2-3). Patient return demonstrated and is in agreement with plan. Patient states she will start today. Will adjust Coumadin as needed when labs result.  Education provided on side effects of Coumadin. Information given for her review. All questions are answered. She knows to call if any questions or concerns.  Labs today are stable today.  Patient has had IV Feraheme and IV injector. Last was on 09/20/2016. Today her iron deficit is 398 mg. We can set her up for one dose of IV feraheme. Patient is in agreement of this. Ferritin is 27 today.   Dr. Grayland Ormond, supervising physician consulted. He is in agreement with plan above.  This note is electronically signed by: Jacquelin Hawking, NP 03/11/2017 12:30 PM

## 2017-03-11 NOTE — Patient Instructions (Signed)
[place] at [facility] Discharge Instructions  RECOMMENDATIONS MADE BY THE CONSULTANT AND ANY TEST RESULTS WILL BE SENT TO YOUR REFERRING PHYSICIAN.  You saw Rulon Abide, NP, today. Pl;ease see Amy or Danielle at front desk for follow up appointments.  Thank you for choosing [place] at [facility] to provide your oncology and hematology care.  To afford each patient quality time with our provider, please arrive at least 15 minutes before your scheduled appointment time.    If you have a lab appointment with the [facility] please come in thru the  Main Entrance and check in at the main information desk  You need to re-schedule your appointment should you arrive 10 or more minutes late.  We strive to give you quality time with our providers, and arriving late affects you and other patients whose appointments are after yours.  Also, if you no show three or more times for appointments you may be dismissed from the clinic at the providers discretion.     Again, thank you for choosing [facility].  Our hope is that these requests will decrease the amount of time that you wait before being seen by our physicians.       _____________________________________________________________  Should you have questions after your visit to [facility], please contact our office at [phone] between the hours of 8:30 a.m. and 4:30 p.m.  Voicemails left after 4:30 p.m. will not be returned until the following business day.  For prescription refill requests, have your pharmacy contact our office.       Resources For Cancer Patients and their Caregivers ? American Cancer Society: Can assist with transportation, wigs, general needs, runs Look Good Feel Better.        808-400-5337 ? Cancer Care: Provides financial assistance, online support groups, medication/co-pay assistance.  1-800-813-HOPE 419-577-8711) ? [place] Assists [place] cancer patients and their families through emotional , educational and financial support.  (623)870-6916 ? [place] DSS Where to apply for food stamps, Medicaid and utility assistance. (617)633-6129 ? [facility]: Transportation to medical appointments. (541)032-0088 ? Social Security Administration: May apply for disability if have a Stage IV cancer. 629-639-0446 807 870 7060 ? [employer], Disability and Transit Services: Assists with nutrition, care and transit needs. [place] Support Programs: @10RELATIVEDAYS @ > Cancer Support Group  2nd Tuesday of the month 1pm-2pm, Journey Room  > Creative Journey  3rd Tuesday of the month 1130am-1pm, Journey Room  > Look Good Feel Better  1st Wednesday of the month 10am-12 noon, Journey Room (Call [place] to register [phone])

## 2017-03-13 ENCOUNTER — Encounter: Payer: Self-pay | Admitting: Family Medicine

## 2017-03-13 ENCOUNTER — Ambulatory Visit: Payer: Medicaid Other | Admitting: Family Medicine

## 2017-03-13 ENCOUNTER — Other Ambulatory Visit: Payer: Self-pay

## 2017-03-13 VITALS — BP 152/92 | HR 68 | Temp 98.6°F | Resp 16 | Ht 70.0 in | Wt 371.8 lb

## 2017-03-13 DIAGNOSIS — Z720 Tobacco use: Secondary | ICD-10-CM | POA: Diagnosis not present

## 2017-03-13 DIAGNOSIS — M5410 Radiculopathy, site unspecified: Secondary | ICD-10-CM

## 2017-03-13 DIAGNOSIS — I10 Essential (primary) hypertension: Secondary | ICD-10-CM

## 2017-03-13 DIAGNOSIS — F17200 Nicotine dependence, unspecified, uncomplicated: Secondary | ICD-10-CM

## 2017-03-13 MED ORDER — LISINOPRIL 40 MG PO TABS: 40.0000 mg | ORAL_TABLET | Freq: Every day | ORAL | 3 refills | Status: DC

## 2017-03-13 MED ORDER — OXYCODONE HCL 10 MG PO TABS: 10.0000 mg | ORAL_TABLET | Freq: Three times a day (TID) | ORAL | 0 refills | Status: DC | PRN

## 2017-03-13 MED ORDER — VARENICLINE TARTRATE 0.5 MG X 11 & 1 MG X 42 PO MISC: ORAL | 0 refills | Status: DC

## 2017-03-13 NOTE — Progress Notes (Signed)
Patient ID: [name] [name], female    DOB: 12-11-1975, 42 y.o.   MRN: [id number]  Chief Complaint  Patient presents with  . Follow-up    Allergies Sulfa antibiotics  Subjective:   [name] [name] is a 42 y.o. female who presents to [facility] today.  HPI Patient presents today for follow-up of her blood pressure.  She reports that her blood pressure is been running better since she started on medication but it is still elevated.  She denies any side effects with medication.  Denies any chest pain, shortness of breath, or lower extremity edema.  She reports that she is feeling better since her blood pressure is a bit lower.  She reports she has been seen by the hematologist and is now on Coumadin in addition to the Lovenox injections.  She is hoping to get to discontinue the Lovenox in the near future when she is therapeutic on her Coumadin doses.  She is getting her INR checked at the hematologist.  Patient reports that she would be interested in getting some help to quit smoking.  She reports that she has never had a medication to help her quit in the past.  She reports that she is cut down her smoking and some days she does really well and only smokes several cigarettes and then some days she smokes more.  She believes she smokes more when her stress level is elevated or when she is in more pain.  She is only one in her home that smokes.  She reports that she knows it is harmful to her health in a terrible habit and she would like some help.  She would like to try medication that would decrease her cravings.  She believes that if she had a medication to help she would be able to quit for good.  She reports she is tried many times but is never had any assistance by taking her medication.  Patient reports that she is disappointed that she is gaining some weight back.  She reports that because she was trying to cut down on her cigarette use she relies she was eating  more.    Past Medical History:  Diagnosis Date  . Abdominal cramps 08/30/2015  . Anemia   . Blood transfusion without reported diagnosis   . Closed left ankle fracture    August 30 2012  . Clotting disorder ([place])   . DVT (deep venous thrombosis) ([facility])    L leg  . Fibroid tumor   . Hidradenitis   . High blood pressure   . Hypertension   . Obesity   . OSA on CPAP   . Oxygen deficiency   . Pregnancy    09/20/14 17 weeks  . Pulmonary embolism ([place]) 04/2013  . Sleep apnea   . Tobacco use disorder     Past Surgical History:  Procedure Laterality Date  . ABSCESS DRAINAGE Bilateral 01/10/15  . CYST REMOVAL TRUNK    . IVC FILTER PLACEMENT ([facility] HX)      Family History  Problem Relation Age of Onset  . Hypertension Mother   . Diabetes Paternal Aunt   . Diabetes Paternal Uncle   . Other Father        blood clots  . Cancer Maternal Grandmother   . Heart attack Paternal Grandmother      Social History   Socioeconomic History  . Marital status: Widowed    Spouse name: None  . Number of children:  None  . Years of education: None  . Highest education level: None  Social Needs  . Financial resource strain: None  . Food insecurity - worry: None  . Food insecurity - inability: None  . Transportation needs - medical: None  . Transportation needs - non-medical: None  Occupational History  . None  Tobacco Use  . Smoking status: Current Some Day Smoker    Packs/day: 0.25    Years: 18.00    Pack years: 4.50    Types: Cigarettes  . Smokeless tobacco: Never Used  . Tobacco comment: smokes 3 cig when she smokes  Substance and Sexual Activity  . Alcohol use: Yes    Alcohol/week: 1.2 oz    Types: 2 Cans of beer per week  . Drug use: No  . Sexual activity: Not Currently    Birth control/protection: Pill  Other Topics Concern  . 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    Has a 41 year old Boyd.   Live with parents.    Was working 5 days a weeks.   Not working right now.    Attends church.    Current Outpatient Medications on File Prior to Visit  Medication Sig Dispense Refill  . Acetaminophen-Caff-Pyrilamine (MIDOL MAX ST MENSTRUAL) 500-60-15 MG TABS Take 2 tablets every 8 (eight) hours as needed by mouth (for pain.).    Marland Kitchen amLODipine (NORVASC) 10 MG tablet Take 1 tablet (10 mg total) by mouth daily. 90 tablet 3  . enoxaparin (LOVENOX) 100 MG/ML injection Inject 1.7 mLs (170 mg total) into the skin every 12 (twelve) hours. 60 Syringe 5  . norethindrone (MICRONOR,CAMILA,ERRIN) 0.35 MG tablet TAKE ONE TABLET BY MOUTH ONCE DAILY 28 tablet 3  . triamterene-hydrochlorothiazide (MAXZIDE-25) 37.5-25 MG tablet Take 1 tablet by mouth daily.    Marland Kitchen warfarin (COUMADIN) 5 MG tablet Take 1 tablet (5 mg total) by mouth daily. 10 tablet 0   Current Facility-Administered Medications on File Prior to Visit  Medication Dose Route Frequency Provider Last Rate Last Dose  . 0.9 %  sodium chloride infusion   Intravenous Continuous Baird Cancer, PA-C 20 mL/hr at 12/22/15 1430      Review of Systems  Constitutional: Negative for unexpected weight change.  Eyes: Negative for visual disturbance.  Respiratory: Negative for cough, chest tightness and shortness of breath.   Cardiovascular: Negative for chest pain, palpitations and leg swelling.  Gastrointestinal: Negative for abdominal pain, diarrhea and nausea.  Skin: Negative for rash.  Neurological: Negative for dizziness, syncope and light-headedness.  Hematological: Negative for adenopathy. Does not bruise/bleed easily.  Psychiatric/Behavioral: Negative for behavioral problems, dysphoric mood, sleep disturbance and suicidal ideas.     Objective:   BP 140/88 (BP Location: Left Arm, Patient Position: Sitting, Cuff Size: Normal)   Pulse 68   Temp 98.6 F (37 C) (Temporal)   Resp 16   Ht 5\' 10"  (1.778 m)   Wt (!) 371 lb 12 oz (168.6 kg)   LMP 02/24/2017   SpO2  99%   BMI 53.34 kg/m   Physical Exam  Constitutional: She is oriented to person, place, and time. She appears well-developed and well-nourished.  HENT:  Head: Normocephalic and atraumatic.  Eyes: EOM are normal. Pupils are equal, round, and reactive to light. No scleral icterus.  Neck: Normal range of motion. Neck supple.  Cardiovascular: Normal rate, regular rhythm and normal heart sounds.  Pulmonary/Chest: Effort normal and breath  sounds normal.  Neurological: She is alert and oriented to person, place, and time.  Skin: Capillary refill takes less than 2 seconds.  Psychiatric: She has a normal mood and affect. Her behavior is normal. Judgment and thought content normal.  Vitals reviewed.    Assessment and Plan  1. Back pain with left-sided radiculopathy Patient has a scheduled  appointment with neurology.  She defers neurosurgery at this time.  Pending visit with neurologist, patient will consider evaluation at interventional pain clinic.  I did tell her that until her appointment on April 19, 2017 I would give her this prescription for oxycodone.  She understands the risks of this medication and the sedation precautions.  She also understands that I will not be giving her this medication on a chronic basis. - Oxycodone HCl 10 MG TABS; Take 1 tablet (10 mg total) by mouth every 8 (eight) hours as needed.  Dispense: 60 tablet; Refill: 0  2. Essential hypertension Blood pressure still uncontrolled.  Will increase dose of lisinopril at this time to 40 mg p.o. daily.  Plan to recheck BMP at follow-up in 1 month. - lisinopril (PRINIVIL,ZESTRIL) 40 MG tablet; Take 1 tablet (40 mg total) by mouth daily.  Dispense: 90 tablet; Refill: 3  .rhdash  3. Tobacco abuse The 5 A's Model for treating Tobacco Use and Dependence was used today. I have identified and documented tobacco use status for this patient. I have urged the patient to quit tobacco use. At this time, the patient is willing and  ready to attempt to quit. We have discussed medication options to aid in smoking cessation including nicotine replacement therapy (NRT), zyban, and chantix. We have also discussed behavioral modifications, lifestyle changes, and patient support options to aid in tobacco cessation success. Patient was congratulated on their desire to make this positive change for their health. Patient instructed to keep their follow up to ensure success.   We discussed chantix for smoking cessation. Discussed that serious neuropsychiatric adverse events have been reported in patients treated with this medication, including changes in mood, psychosis, hallucinations, paranoia, delusion, homicidal ideation, aggression, hostility, agitation, anxiety, suicide ideation, suicide attempt, and completed suicide. Advised patient that if taking this medication and any symptoms occur that the patient should STOP taking chantix and contact a healthcare provider via the ED or our office. Discussed that other common adverse reactions can occur with this medication such as nausea, abnormal dreams, constipation, gas, and vomiting. Patient told that upon starting medication that they should use caution when driving or operating machinery or engaging in hazardous activity until they know how this medication will affect them. Patient understands the risk of this medication and voiced understanding. Agrees to keep follow up appointments and follow the treatment plan.   - varenicline (CHANTIX STARTING MONTH PAK) 0.5 MG X 11 & 1 MG X 42 tablet; Take one 0.5 mg tablet by mouth once daily for 3 days, then increase to one 0.5 mg tablet twice daily for 4 days, then increase to one 1 mg tablet twice daily.  Dispense: 53 tablet; Refill: 0  QUIT DATE 03/21/2017 OV was 25 minutes and greater than 50% spent counseling and coordinating care.  Return in about 4 weeks (around 04/10/2017). Caren Macadam, MD 03/13/2017

## 2017-03-14 ENCOUNTER — Other Ambulatory Visit (HOSPITAL_COMMUNITY): Payer: Self-pay | Admitting: *Deleted

## 2017-03-14 DIAGNOSIS — I2602 Saddle embolus of pulmonary artery with acute cor pulmonale: Secondary | ICD-10-CM

## 2017-03-17 ENCOUNTER — Inpatient Hospital Stay (HOSPITAL_COMMUNITY): Payer: Medicaid Other

## 2017-03-17 ENCOUNTER — Encounter (HOSPITAL_COMMUNITY): Payer: Self-pay

## 2017-03-17 ENCOUNTER — Other Ambulatory Visit: Payer: Self-pay

## 2017-03-17 VITALS — BP 129/60 | HR 72 | Temp 99.0°F | Resp 18 | Wt 368.8 lb

## 2017-03-17 DIAGNOSIS — I2602 Saddle embolus of pulmonary artery with acute cor pulmonale: Secondary | ICD-10-CM

## 2017-03-17 DIAGNOSIS — D500 Iron deficiency anemia secondary to blood loss (chronic): Secondary | ICD-10-CM

## 2017-03-17 DIAGNOSIS — D509 Iron deficiency anemia, unspecified: Secondary | ICD-10-CM | POA: Diagnosis not present

## 2017-03-17 LAB — PROTIME-INR
INR: 1.06
Prothrombin Time: 13.7 seconds (ref 11.4–15.2)

## 2017-03-17 MED ORDER — SODIUM CHLORIDE 0.9 % IV SOLN
Freq: Once | INTRAVENOUS | Status: AC
  Administered 2017-03-17: 10:00:00 via INTRAVENOUS

## 2017-03-17 MED ORDER — SODIUM CHLORIDE 0.9 % IV SOLN
510.0000 mg | Freq: Once | INTRAVENOUS | Status: AC
  Administered 2017-03-17: 510 mg via INTRAVENOUS
  Filled 2017-03-17: qty 17

## 2017-03-17 NOTE — Patient Instructions (Signed)
[place] at [facility] Discharge Instructions  RECOMMENDATIONS MADE BY THE CONSULTANT AND ANY TEST RESULTS WILL BE SENT TO YOUR REFERRING PHYSICIAN.  feraheme done today. Follow up as scheduled.  Thank you for choosing [place] at [facility] to provide your oncology and hematology care.  To afford each patient quality time with our provider, please arrive at least 15 minutes before your scheduled appointment time.    If you have a lab appointment with the [facility] please come in thru the  Main Entrance and check in at the main information desk  You need to re-schedule your appointment should you arrive 10 or more minutes late.  We strive to give you quality time with our providers, and arriving late affects you and other patients whose appointments are after yours.  Also, if you no show three or more times for appointments you may be dismissed from the clinic at the providers discretion.     Again, thank you for choosing [facility].  Our hope is that these requests will decrease the amount of time that you wait before being seen by our physicians.       _____________________________________________________________  Should you have questions after your visit to [facility], please contact our office at [phone] between the hours of 8:30 a.m. and 4:30 p.m.  Voicemails left after 4:30 p.m. will not be returned until the following business day.  For prescription refill requests, have your pharmacy contact our office.       Resources For Cancer Patients and their Caregivers ? American Cancer Society: Can assist with transportation, wigs, general needs, runs Look Good Feel Better.        (325)681-2314 ? Cancer Care: Provides financial assistance, online support groups, medication/co-pay assistance.  1-800-813-HOPE 936 049 8767) ? [facility] Assists [place] cancer patients and  their families through emotional , educational and financial support.  (647) 351-1305 ? [place] DSS Where to apply for food stamps, Medicaid and utility assistance. 873-398-7097 ? [facility]: Transportation to medical appointments. 848-318-2326 ? Social Security Administration: May apply for disability if have a Stage IV cancer. 601-177-4166 9026541368 ? [employer], Disability and Transit Services: Assists with nutrition, care and transit needs. [place] Support Programs: @10RELATIVEDAYS @ > Cancer Support Group  2nd Tuesday of the month 1pm-2pm, Journey Room  > Creative Journey  3rd Tuesday of the month 1130am-1pm, Journey Room  > Look Good Feel Better  1st Wednesday of the month 10am-12 noon, Journey Room (Call [place] to register [phone])

## 2017-03-17 NOTE — Progress Notes (Signed)
Pt tolerated iron infusion without any problems. Discharged in stable condition. Ambulated by self out of the facility.

## 2017-03-19 ENCOUNTER — Other Ambulatory Visit (HOSPITAL_COMMUNITY): Payer: Self-pay | Admitting: Adult Health

## 2017-03-19 DIAGNOSIS — I2602 Saddle embolus of pulmonary artery with acute cor pulmonale: Secondary | ICD-10-CM

## 2017-03-19 DIAGNOSIS — Z7901 Long term (current) use of anticoagulants: Secondary | ICD-10-CM

## 2017-03-19 MED ORDER — WARFARIN SODIUM 5 MG PO TABS: 7.5000 mg | ORAL_TABLET | Freq: Every day | ORAL | 0 refills | Status: DC

## 2017-03-24 ENCOUNTER — Inpatient Hospital Stay (HOSPITAL_COMMUNITY): Payer: Medicaid Other | Attending: Oncology

## 2017-03-24 DIAGNOSIS — Z7901 Long term (current) use of anticoagulants: Secondary | ICD-10-CM | POA: Insufficient documentation

## 2017-03-24 DIAGNOSIS — I2602 Saddle embolus of pulmonary artery with acute cor pulmonale: Secondary | ICD-10-CM

## 2017-03-24 DIAGNOSIS — D509 Iron deficiency anemia, unspecified: Secondary | ICD-10-CM | POA: Insufficient documentation

## 2017-03-24 DIAGNOSIS — Z86711 Personal history of pulmonary embolism: Secondary | ICD-10-CM | POA: Insufficient documentation

## 2017-03-24 LAB — PROTIME-INR
INR: 1.19
Prothrombin Time: 15 seconds (ref 11.4–15.2)

## 2017-03-27 ENCOUNTER — Other Ambulatory Visit (HOSPITAL_COMMUNITY): Payer: Self-pay

## 2017-03-27 DIAGNOSIS — D508 Other iron deficiency anemias: Secondary | ICD-10-CM

## 2017-03-31 ENCOUNTER — Inpatient Hospital Stay (HOSPITAL_COMMUNITY): Payer: Medicaid Other

## 2017-03-31 ENCOUNTER — Other Ambulatory Visit (HOSPITAL_COMMUNITY): Payer: Self-pay | Admitting: Emergency Medicine

## 2017-03-31 DIAGNOSIS — I2602 Saddle embolus of pulmonary artery with acute cor pulmonale: Secondary | ICD-10-CM

## 2017-03-31 DIAGNOSIS — D509 Iron deficiency anemia, unspecified: Secondary | ICD-10-CM | POA: Diagnosis not present

## 2017-03-31 LAB — PROTIME-INR
INR: 1.46
PROTHROMBIN TIME: 17.6 s — AB (ref 11.4–15.2)

## 2017-03-31 MED ORDER — WARFARIN SODIUM 5 MG PO TABS: 7.5000 mg | ORAL_TABLET | Freq: Every day | ORAL | 0 refills | Status: DC

## 2017-03-31 NOTE — Progress Notes (Signed)
Increased coumadin to 15 mg (3 tablets) daily.  Recheck INR on 04/03/2017 at 9:10 am

## 2017-03-31 NOTE — Progress Notes (Signed)
Coumadin refilled. 

## 2017-04-03 ENCOUNTER — Inpatient Hospital Stay (HOSPITAL_COMMUNITY): Payer: Medicaid Other

## 2017-04-03 DIAGNOSIS — D509 Iron deficiency anemia, unspecified: Secondary | ICD-10-CM | POA: Diagnosis not present

## 2017-04-03 DIAGNOSIS — D508 Other iron deficiency anemias: Secondary | ICD-10-CM

## 2017-04-03 DIAGNOSIS — Z7901 Long term (current) use of anticoagulants: Secondary | ICD-10-CM

## 2017-04-03 DIAGNOSIS — I2602 Saddle embolus of pulmonary artery with acute cor pulmonale: Secondary | ICD-10-CM

## 2017-04-03 LAB — PROTIME-INR
INR: 1.57
PROTHROMBIN TIME: 18.6 s — AB (ref 11.4–15.2)

## 2017-04-03 LAB — CBC WITH DIFFERENTIAL/PLATELET
BASOS ABS: 0 10*3/uL (ref 0.0–0.1)
BASOS PCT: 1 %
Eosinophils Absolute: 0.2 10*3/uL (ref 0.0–0.7)
Eosinophils Relative: 5 %
HEMATOCRIT: 39.9 % (ref 36.0–46.0)
Hemoglobin: 12.5 g/dL (ref 12.0–15.0)
LYMPHS PCT: 27 %
Lymphs Abs: 1.3 10*3/uL (ref 0.7–4.0)
MCH: 28.3 pg (ref 26.0–34.0)
MCHC: 31.3 g/dL (ref 30.0–36.0)
MCV: 90.3 fL (ref 78.0–100.0)
Monocytes Absolute: 0.3 10*3/uL (ref 0.1–1.0)
Monocytes Relative: 6 %
NEUTROS ABS: 3 10*3/uL (ref 1.7–7.7)
NEUTROS PCT: 61 %
Platelets: 252 10*3/uL (ref 150–400)
RBC: 4.42 MIL/uL (ref 3.87–5.11)
RDW: 16.1 % — AB (ref 11.5–15.5)
WBC: 4.9 10*3/uL (ref 4.0–10.5)

## 2017-04-03 LAB — COMPREHENSIVE METABOLIC PANEL
ALT: 12 U/L — AB (ref 14–54)
AST: 14 U/L — AB (ref 15–41)
Albumin: 3.8 g/dL (ref 3.5–5.0)
Alkaline Phosphatase: 51 U/L (ref 38–126)
Anion gap: 9 (ref 5–15)
BILIRUBIN TOTAL: 0.5 mg/dL (ref 0.3–1.2)
BUN: 17 mg/dL (ref 6–20)
CO2: 27 mmol/L (ref 22–32)
Calcium: 9.5 mg/dL (ref 8.9–10.3)
Chloride: 101 mmol/L (ref 101–111)
Creatinine, Ser: 0.91 mg/dL (ref 0.44–1.00)
Glucose, Bld: 102 mg/dL — ABNORMAL HIGH (ref 65–99)
Potassium: 4 mmol/L (ref 3.5–5.1)
Sodium: 137 mmol/L (ref 135–145)
TOTAL PROTEIN: 8.4 g/dL — AB (ref 6.5–8.1)

## 2017-04-03 LAB — IRON AND TIBC
IRON: 70 ug/dL (ref 28–170)
Saturation Ratios: 22 % (ref 10.4–31.8)
TIBC: 322 ug/dL (ref 250–450)
UIBC: 252 ug/dL

## 2017-04-03 LAB — FERRITIN: Ferritin: 173 ng/mL (ref 11–307)

## 2017-04-07 ENCOUNTER — Inpatient Hospital Stay (HOSPITAL_COMMUNITY): Payer: Medicaid Other

## 2017-04-07 ENCOUNTER — Ambulatory Visit (HOSPITAL_COMMUNITY): Payer: Self-pay | Admitting: Internal Medicine

## 2017-04-07 DIAGNOSIS — D509 Iron deficiency anemia, unspecified: Secondary | ICD-10-CM | POA: Diagnosis not present

## 2017-04-07 DIAGNOSIS — I2602 Saddle embolus of pulmonary artery with acute cor pulmonale: Secondary | ICD-10-CM

## 2017-04-07 LAB — PROTIME-INR
INR: 3.66
PROTHROMBIN TIME: 36.1 s — AB (ref 11.4–15.2)

## 2017-04-08 ENCOUNTER — Other Ambulatory Visit: Payer: Self-pay

## 2017-04-08 ENCOUNTER — Other Ambulatory Visit: Payer: Self-pay | Admitting: Obstetrics & Gynecology

## 2017-04-08 MED ORDER — TRIAMTERENE-HCTZ 37.5-25 MG PO TABS: 1.0000 | ORAL_TABLET | Freq: Every day | ORAL | 0 refills | Status: DC

## 2017-04-10 ENCOUNTER — Inpatient Hospital Stay (HOSPITAL_COMMUNITY): Payer: Medicaid Other

## 2017-04-10 DIAGNOSIS — I2602 Saddle embolus of pulmonary artery with acute cor pulmonale: Secondary | ICD-10-CM

## 2017-04-10 DIAGNOSIS — Z7901 Long term (current) use of anticoagulants: Secondary | ICD-10-CM

## 2017-04-10 DIAGNOSIS — D509 Iron deficiency anemia, unspecified: Secondary | ICD-10-CM | POA: Diagnosis not present

## 2017-04-10 LAB — PROTIME-INR
INR: 4.62
Prothrombin Time: 43.3 seconds — ABNORMAL HIGH (ref 11.4–15.2)

## 2017-04-10 NOTE — Progress Notes (Signed)
CRITICAL VALUE ALERT Critical value received:  INR-4.62  Date of notification:  04/10/17 Time of notification: 4920 Critical value read back:  Yes.   Nurse who received alert:  M.Servando Kyllonen,LPN MD notified (1st page):  G.Dawson,NP

## 2017-04-10 NOTE — Progress Notes (Signed)
After reviewing with NP, called patient and notified her to decrease coumadin to 15 mg as instructed by the NP. Recheck labs on Monday, 04/14/17. Patient verbalized understanding. Also educated patient on what foods should not be eaten while on coumadin. Patient verbalized understanding.

## 2017-04-14 ENCOUNTER — Telehealth (HOSPITAL_COMMUNITY): Payer: Self-pay

## 2017-04-14 ENCOUNTER — Inpatient Hospital Stay (HOSPITAL_COMMUNITY): Payer: Medicaid Other

## 2017-04-14 DIAGNOSIS — I2602 Saddle embolus of pulmonary artery with acute cor pulmonale: Secondary | ICD-10-CM

## 2017-04-14 DIAGNOSIS — Z7901 Long term (current) use of anticoagulants: Secondary | ICD-10-CM

## 2017-04-14 DIAGNOSIS — D509 Iron deficiency anemia, unspecified: Secondary | ICD-10-CM | POA: Diagnosis not present

## 2017-04-14 LAB — PROTIME-INR
INR: 4.25
Prothrombin Time: 40.6 seconds — ABNORMAL HIGH (ref 11.4–15.2)

## 2017-04-14 NOTE — Telephone Encounter (Signed)
CRITICAL VALUE ALERT Critical value received:  INR-4.25 Date of notification:  04/14/17 Time of notification: 1101 Critical value read back:  Yes.   Nurse who received alert:  M.Darnesha Diloreto,LPN MD notified (1st page):  S.Katragadda, MD  Reviewed with Dr. Raliegh Ip. He ordered for patient to decrease coumadin to 10 mg for 2 days, then up to 12.5 mg daily. Recheck INR in 1 week. Notified patient with understanding verbalized. She has a MD appt on 04/22/17. Lab appt added to that day.

## 2017-04-15 ENCOUNTER — Encounter: Payer: Self-pay | Admitting: Family Medicine

## 2017-04-15 ENCOUNTER — Other Ambulatory Visit: Payer: Self-pay

## 2017-04-15 ENCOUNTER — Ambulatory Visit: Payer: Medicaid Other | Admitting: Family Medicine

## 2017-04-15 VITALS — BP 130/72 | HR 82 | Temp 98.4°F | Resp 16 | Ht 70.0 in | Wt 366.8 lb

## 2017-04-15 DIAGNOSIS — F439 Reaction to severe stress, unspecified: Secondary | ICD-10-CM | POA: Diagnosis not present

## 2017-04-15 DIAGNOSIS — M5410 Radiculopathy, site unspecified: Secondary | ICD-10-CM

## 2017-04-15 DIAGNOSIS — F17200 Nicotine dependence, unspecified, uncomplicated: Secondary | ICD-10-CM | POA: Diagnosis not present

## 2017-04-15 DIAGNOSIS — I10 Essential (primary) hypertension: Secondary | ICD-10-CM

## 2017-04-15 MED ORDER — OXYCODONE HCL 10 MG PO TABS: 10.0000 mg | ORAL_TABLET | Freq: Three times a day (TID) | ORAL | 0 refills | Status: DC | PRN

## 2017-04-15 NOTE — Progress Notes (Signed)
Patient ID: [name] [name], female    DOB: [birth year], 42 y.o.   MRN: [id number]  Chief Complaint  Patient presents with  . Follow-up    Allergies Sulfa antibiotics  Subjective:   [name] [name] is a 42 y.o. female who presents to [facility] today.  HPI [name] [name] presents today for follow-up.  She reports that she has been taking all her blood pressure medications as directed.  She presents today for recheck of her blood pressure.  She denies any side effects with the medication.  Denies any chest pain, shortness of breath, or swelling in her extremities.  She is very happy that her blood pressure is down at this time.  She reports that she is cut back on her smoking tremendously and is now only smoking 2 cigarettes a day.  She reports she was unable to tolerate the Chantix.  She did not like the chantix because made her feel weird.  She reports that while taking the medication she felt sad and was crying for no reason. Quit the chantix and feels back to normal.   He does report some increased stress in her life over the past several months but she does not believe she needs any medication.  She believes she is dealing with it fine and has friends and supportive people to talk with about her issues.  She reports that the father of her child has been in and out of her life multiple times over the past several months.  She reports infidelity on his part multiple times.  She does not wish to be checked for any sexually transmitted infections.  She reports she has not been sexually active with him in several months.  She reports that she has taken him back multiple times after infidelity and just recently found out that he has been with another woman.  She reports this is been very difficult for her and reports that over the past couple months that he has not made contact with her their daughter.  She reports that this makes her sad.  She denies any suicidal or homicidal ideation.   Denies any auditory or visual hallucinations.  She reports that she is dealing with this and does not believe she needs medications at this time.  She does not wish to see a therapist at all.  She does have an upcoming visit with Dr. Merlene Laughter for pain management.  She is questioning about this visit today because she is not interested in being on chronic narcotics but is more interested in possible interventional pain management.  She would like a referral to interventional pain specialist.  She was unable to go to the last visit with Dr. Merlene Laughter because she reports that our office name was not on the Medicaid card and we were the referring doctor.  She does request a refill on the oxycodone until she gets in with the pain clinic.  She understands that this is not going to be a chronic medication filled by our office.  She has been seen by hematology and is now off of Lovenox and is on the Coumadin.  She is still working with hematology to get her INR adjusted.  She denies any pain in her extremities.  She denies any swelling.  She is breathing well.  She reports that she did lose a few pounds and this makes her happy.  She reports that she would like to get down to a healthy weight.    Past  Medical History:  Diagnosis Date  . Abdominal cramps 08/30/2015  . Anemia   . Blood transfusion without reported diagnosis   . Closed left ankle fracture    August 30 2012  . Clotting disorder ([place])   . DVT (deep venous thrombosis) ([facility])    L leg  . Fibroid tumor   . Hidradenitis   . High blood pressure   . Hypertension   . Obesity   . OSA on CPAP   . Oxygen deficiency   . Pregnancy    09/20/14 17 weeks  . Pulmonary embolism ([place]) 04/2013  . Sleep apnea   . Tobacco use disorder     Past Surgical History:  Procedure Laterality Date  . ABSCESS DRAINAGE Bilateral 01/10/15  . CYST REMOVAL TRUNK    . IVC FILTER PLACEMENT ([facility] H[facility])      Family History  Problem Relation Age of Onset  . Hypertension  Mother   . Diabetes Paternal Aunt   . Diabetes Paternal Uncle   . Other Father        blood clots  . Cancer Maternal Grandmother   . Heart attack Paternal Grandmother      Social History   Socioeconomic History  . Marital status: Widowed    Spouse name: Not on file  . Number of children: Not on file  . Years of education: Not on file  . Highest education level: Not on file  Occupational History  . Not on file  Social Needs  . Financial resource strain: Not on file  . Food insecurity:    Worry: Not on file    Inability: Not on file  . Transportation needs:    Medical: Not on file    Non-medical: Not on file  Tobacco Use  . Smoking status: Current Some Day Smoker    Packs/day: 0.25    Years: 18.00    Pack years: 4.50    Types: Cigarettes  . Smokeless tobacco: Never Used  . Tobacco comment: smokes 3 cig when she smokes  Substance and Sexual Activity  . Alcohol use: Yes    Alcohol/week: 1.2 oz    Types: 2 Cans of beer per week  . Drug use: No  . Sexual activity: Not Currently    Birth control/protection: Pill  Lifestyle  . Physical activity:    Days per week: Not on file    Minutes per session: Not on file  . Stress: Not on file  Relationships  . Social connections:    Talks on phone: Not on file    Gets together: Not on file    Attends religious service: Not on file    Active member of club or organization: Not on file    Attends meetings of clubs or organizations: Not on file    Relationship status: Not on file  Other Topics Concern  . 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    Has a 41 year old [place].   Live with parents.   Was working 5 days a weeks.   Not working right now.    Attends church.     Review of Systems  Constitutional: Negative for activity change, appetite change and fever.  HENT: Negative for trouble swallowing.   Eyes: Negative for visual disturbance.  Respiratory:  Negative for cough, chest tightness and shortness of breath.   Cardiovascular: Negative for chest pain, palpitations and leg swelling.  Gastrointestinal: Negative for abdominal pain, diarrhea, nausea and vomiting.  Genitourinary: Negative for dysuria, frequency and urgency.  Musculoskeletal: Positive for arthralgias and back pain. Negative for neck pain and neck stiffness.  Skin: Negative for rash.  Neurological: Negative for dizziness, syncope and light-headedness.  Hematological: Negative for adenopathy.  Psychiatric/Behavioral: Negative for agitation, behavioral problems, dysphoric mood, self-injury, sleep disturbance and suicidal ideas. The patient is not nervous/anxious and is not hyperactive.      Objective:   BP 130/72 (BP Location: Left Arm, Patient Position: Sitting, Cuff Size: Normal)   Pulse 82   Temp 98.4 F (36.9 C) (Temporal)   Resp 16   Ht 5\' 10"  (1.778 m)   Wt (!) 366 lb 12 oz (166.4 kg)   SpO2 98%   BMI 52.62 kg/m   Physical Exam  Constitutional: She is oriented to person, place, and time. She appears well-developed and well-nourished. No distress.  HENT:  Head: Normocephalic and atraumatic.  Eyes: Pupils are equal, round, and reactive to light.  Neck: Normal range of motion. Neck supple. No thyromegaly present.  Cardiovascular: Normal rate, regular rhythm and normal heart sounds.  Pulmonary/Chest: Effort normal and breath sounds normal. No respiratory distress.  Neurological: She is alert and oriented to person, place, and time. No cranial nerve deficit.  Skin: Skin is warm and dry.  Psychiatric: She has a normal mood and affect. Her behavior is normal. Judgment and thought content normal.  Nursing note and vitals reviewed.   Depression screen [facility] 2/9 04/15/2017 01/31/2017 01/27/2014  Decreased Interest 0 0 0  Down, Depressed, Hopeless 0 0 0  PHQ - 2 Score 0 0 0    Assessment and Plan  1. HTN, goal below 140/90 Blood pressure under good control.  Continue  current medications as directed.  At last visit her lisinopril was increased.  However, several weeks ago she did have her CMP checked by hematology.  Her potassium and creatinine were within normal limits.  Therefore no indication for testing today. Exercise as tolerated.  Diet and weight loss modifications recommended.  2. Back pain with left-sided radiculopathy Original pain management referral to [place] [facility] done today.  Refill given on medication.  She will cancel her visit with Dr. Merlene Laughter.Patient counseled in detail regarding the risks of medication. Told to call or return to clinic if develop any worrisome signs or symptoms. Patient voiced understanding.   - Oxycodone HCl 10 MG TABS; Take 1 tablet (10 mg total) by mouth every 8 (eight) hours as needed.  Dispense: 60 tablet; Refill: 0 - Ambulatory referral to Pain Clinic  3. Tobacco use disorder The 5 A's Model for treating Tobacco Use and Dependence was used today. I have identified and documented tobacco use status for this patient. I have urged the patient to quit tobacco use. At this time, the patient is willing and ready to attempt to quit. We have discussed medication options to aid in smoking cessation including nicotine replacement therapy (NRT), zyban, and chantix. We have also discussed behavioral modifications, lifestyle changes, and patient support options to aid in tobacco cessation success. Patient was congratulated on their desire to make this positive change for their health. Patient instructed to keep their follow up to ensure success.  She does not wish to use any medication for tobacco cessation at this time.  She will continue to decrease her tobacco use.  If she changes her mind she will call our office.  Due to the mood issues that she had on  the Chantix I do not believe this is a good option for her at this time.  4. Morbid obesity ([place]) Continue weight loss as discussed.  Consider bariatric referral and will  discuss at next visit.  Dietary changes and modifications discussed with patient today.  5. Stress at home Patient with life stressors and relational stress.  Defers therapist at this time.  She was counseled that if her stress increased or mood worsen to please call or return to clinic.  She denies any suicidal or homicidal ideations.  She understands that if her mood changes in any way or she needs help to please contact medical help or call 911.  She voiced understanding. Return in about 1 month (around 05/13/2017) for follow up. Caren Macadam, MD 04/15/2017

## 2017-04-17 ENCOUNTER — Telehealth (HOSPITAL_COMMUNITY): Payer: Self-pay | Admitting: *Deleted

## 2017-04-17 ENCOUNTER — Other Ambulatory Visit (HOSPITAL_COMMUNITY): Payer: Self-pay | Admitting: *Deleted

## 2017-04-17 DIAGNOSIS — I2602 Saddle embolus of pulmonary artery with acute cor pulmonale: Secondary | ICD-10-CM

## 2017-04-17 MED ORDER — WARFARIN SODIUM 5 MG PO TABS: 7.5000 mg | ORAL_TABLET | Freq: Every day | ORAL | 0 refills | Status: DC

## 2017-04-21 ENCOUNTER — Encounter (HOSPITAL_COMMUNITY): Payer: Self-pay | Admitting: *Deleted

## 2017-04-21 ENCOUNTER — Other Ambulatory Visit (HOSPITAL_COMMUNITY): Payer: Self-pay | Admitting: *Deleted

## 2017-04-21 DIAGNOSIS — I2602 Saddle embolus of pulmonary artery with acute cor pulmonale: Secondary | ICD-10-CM

## 2017-04-22 ENCOUNTER — Inpatient Hospital Stay (HOSPITAL_COMMUNITY): Payer: Medicaid Other

## 2017-04-22 ENCOUNTER — Inpatient Hospital Stay (HOSPITAL_COMMUNITY): Payer: Medicaid Other | Attending: Internal Medicine | Admitting: Internal Medicine

## 2017-04-22 ENCOUNTER — Encounter (HOSPITAL_COMMUNITY): Payer: Self-pay | Admitting: Internal Medicine

## 2017-04-22 ENCOUNTER — Other Ambulatory Visit (HOSPITAL_COMMUNITY): Payer: Self-pay | Admitting: Adult Health

## 2017-04-22 VITALS — BP 130/75 | HR 71 | Temp 98.0°F | Resp 18 | Wt 363.0 lb

## 2017-04-22 DIAGNOSIS — I2602 Saddle embolus of pulmonary artery with acute cor pulmonale: Secondary | ICD-10-CM

## 2017-04-22 DIAGNOSIS — I2692 Saddle embolus of pulmonary artery without acute cor pulmonale: Secondary | ICD-10-CM | POA: Diagnosis not present

## 2017-04-22 DIAGNOSIS — E669 Obesity, unspecified: Secondary | ICD-10-CM | POA: Diagnosis not present

## 2017-04-22 DIAGNOSIS — I2782 Chronic pulmonary embolism: Secondary | ICD-10-CM

## 2017-04-22 DIAGNOSIS — Z7901 Long term (current) use of anticoagulants: Secondary | ICD-10-CM | POA: Diagnosis not present

## 2017-04-22 DIAGNOSIS — Z720 Tobacco use: Secondary | ICD-10-CM | POA: Diagnosis not present

## 2017-04-22 DIAGNOSIS — D509 Iron deficiency anemia, unspecified: Secondary | ICD-10-CM

## 2017-04-22 LAB — PROTIME-INR
INR: 1.87
Prothrombin Time: 21.4 seconds — ABNORMAL HIGH (ref 11.4–15.2)

## 2017-04-22 MED ORDER — WARFARIN SODIUM 2 MG PO TABS: 2.0000 mg | ORAL_TABLET | Freq: Every day | ORAL | 0 refills | Status: DC

## 2017-04-22 NOTE — Progress Notes (Signed)
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  Assessment and Plan: 1.  Saddle PE.  Patient is currently on Coumadin 5 mg alternating with 2.5 mg daily.  INR today is 1.87.  She will continue current dose and will have repeat labs in 2 weeks.  Pending her INR results at that time she will likely be scheduled for repeat lab monitoring.  She is tentatively given a follow-up in July 2019.  2.  Hypertension.  Blood pressure is 130/75.  Continue to follow-up with primary care physician.  3.  Iron deficiency anemia.  She was given Feraheme in February 2019.  Labs done 04/03/2017 showed a hemoglobin of 12.5, ferritin was 173.  She will have repeat labs in July 2019.  When questioned she reports she has not undergone GI evaluation.  May have to consider this for additional workup due to unprovoked PE.  Will discuss this further on return to clinic in July 2019.  4.  Smoking.  Cessation is recommended especially due to history of thrombosis.  Will need to consider CT imaging in this patient for lung cancer screening.  5.  Obesity.  Weight management recommended.  6.  Health maintenance.  She has undergone mammogram in February 27, 2017 that was negative and patient should have repeat mammogram in February 2020.     INTERVAL HISTORY: 42 year old female with saddle PE, unprovoked with CT evidence of right strain and clot extending from the distal main pulmonary artery to the left and right pulmonary artery extension into the LUL, LLL and lingula branches. She was hospitalized for right heart strain and had IVC filter placement on 04/2013.  She was seen at [facility] coagulation clinic in March 2017 for lifelong anticoagulation. She was treated in the past with lovenox bridge  for coumadin.  She is currently on coumadin.   Pt also has a history of Iron deficiency anemia and was treated with  IV iron replacement therapy.  Current Status:  Pt is seen today for follow-up to go over labs.  She was recently treated with [facility] in 02/2017.  She remains on coumadin.    She had a second opinion at [facility]: Patient Instructions - [name], MD - 04/18/2015 9:20 AM EDT You should continue on blood thinners long term due to your previous unprovoked clots, especially given the severe nature of your lung clots. Similar to my recommendations at your initial visit, I would not recommend transitioning to one of the new oral anticoagulants like Xarelto at this time. These medications have not been studied well in individuals with weights >~250 pounds, so we don't know how effective they are in this population. As such, if you wish to change from the Lovenox shots to an oral medication, I would recommend warfarin or Coumadin. You would start this and overlap it with Lovenox until your Coumadin levels are in the therapeutic range. This therapeutic range is an INR of 2-3. You will need to have someone locally to follow your INR levels and adjust Coumadin doses as needed. This is typically done by a primary care doctor, although Dr. Whitney Muse may be able to assist as well. You should be mindful of having stable intake of vitamin K containing foods while on Coumadin. You do not need to eliminate them from your diet but just eat a similar  amount every week. Otherwise it will make keeping your Coumadin levels in the therapeutic range of difficult task. I anticipate having data in the next few years on the use of the new oral anticoagulants at the extremes of the body weight spectrum, so likely we can transitioning to one of these medicines in the future.  Regarding your left leg pain symptoms, I imagine it is due to something called post-thrombotic syndrome. The main treatment for this is to  wear compression stockings. Try the over-the-counter type first and if no improvement then you can fill the prescription provided today for medical grade stockings. If ultimately you cannot tolerate the stockings or they do not improve symptoms significantly, then there is no need to continue wearing them. I recommend that you avoid any estrogen containing systemic hormonal preparations. The safest hormonal option for birth control is the [place] or [place] IUDs. The next safest options are the progesterone only pills or other progesterone only preparations. Taking them while on anticoagulation is likely safe, but again I would recommend avoiding estrogen containing preparations. If you were to become pregnant again in the future, you would need to be treated with Lovenox throughout the pregnancy like before.  For now, lets plan to see back here every 1-2 years to reassess the decision for long-term blood thinners and if there are alternative options available to you. I'm happy to see her sooner if new questions or problems.   Problem List Patient Active Problem List   Diagnosis Date Noted  . Abdominal cramps [R10.9] 08/30/2015  . Morbid obesity ([place]) [E66.01] 08/18/2014  . Fibroid, uterine [D25.9] 08/18/2014  . Benign essential hypertension [I10] 07/21/2014  . Iron deficiency anemia due to chronic blood loss [D50.0] 02/05/2014  . Back pain with left-sided radiculopathy [M54.10] 10/31/2013  . Secondary pulmonary hypertension [IMO0002] 07/06/2013  . Obstructive sleep apnea [G47.33] 07/06/2013  . Pleuritic chest pain [R07.81] 04/27/2013  . SOB (shortness of breath) [R06.02] 04/27/2013  . Hyperglycemia [R73.9] 04/27/2013  . Anemia [D64.9] 04/27/2013  . Tobacco use disorder [F17.200] 04/27/2013  . Saddle embolus of pulmonary artery with acute cor pulmonale (HCC) [I26.02] 04/27/2013    Past Medical History Past Medical History:  Diagnosis Date  . Abdominal cramps 08/30/2015  . Anemia   . Blood  transfusion without reported diagnosis   . Closed left ankle fracture    August 30 2012  . Clotting disorder ([place])   . DVT (deep venous thrombosis) (HCC)    L leg  . Fibroid tumor   . Hidradenitis   . High blood pressure   . Hypertension   . Obesity   . OSA on CPAP   . Oxygen deficiency   . Pregnancy    09/20/14 17 weeks  . Pulmonary embolism ([place]) 04/2013  . Sleep apnea   . Tobacco use disorder     Past Surgical History Past Surgical History:  Procedure Laterality Date  . ABSCESS DRAINAGE Bilateral 01/10/15  . CYST REMOVAL TRUNK    . IVC FILTER PLACEMENT ([facility] HX)      Family History Family History  Problem Relation Age of Onset  . Hypertension Mother   . Diabetes Paternal Aunt   . Diabetes Paternal Uncle   . Other Father        blood clots  . Cancer Maternal Grandmother   . Heart attack Paternal Grandmother      Social History  reports that she has been smoking cigarettes.  She has a 4.50 pack-year smoking history. She  has never used smokeless tobacco. She reports that she drinks about 1.2 oz of alcohol per week. She reports that she does not use drugs.  Medications  Current Outpatient Medications:  .  amLODipine (NORVASC) 10 MG tablet, Take 1 tablet (10 mg total) by mouth daily., Disp: 90 tablet, Rfl: 3 .  lisinopril (PRINIVIL,ZESTRIL) 40 MG tablet, Take 1 tablet (40 mg total) by mouth daily., Disp: 90 tablet, Rfl: 3 .  norethindrone (MICRONOR,CAMILA,ERRIN) 0.35 MG tablet, TAKE ONE TABLET BY MOUTH ONCE DAILY, Disp: 28 tablet, Rfl: 3 .  Oxycodone HCl 10 MG TABS, Take 1 tablet (10 mg total) by mouth every 8 (eight) hours as needed., Disp: 60 tablet, Rfl: 0 .  triamterene-hydrochlorothiazide (MAXZIDE-25) 37.5-25 MG tablet, TAKE 1 TABLET BY MOUTH ONCE DAILY, Disp: 30 tablet, Rfl: 11 .  warfarin (COUMADIN) 2 MG tablet, Take 1 tablet (2 mg total) by mouth daily., Disp: 30 tablet, Rfl: 0 .  warfarin (COUMADIN) 5 MG tablet, Take 1.5 tablets (7.5 mg total) by mouth daily.  (Patient taking differently: Take 12.5 mg by mouth daily. ), Disp: 60 tablet, Rfl: 0 No current facility-administered medications for this visit.   Facility-Administered Medications Ordered in Other Visits:  .  0.9 %  sodium chloride infusion, , Intravenous, Continuous, Kefalas, Manon Hilding, PA-C, Last Rate: 20 mL/hr at 12/22/15 1430  Allergies Sulfa antibiotics  Review of Systems Review of Systems - Oncology ROS as per HPI otherwise 12 point ROS is negative.   Physical Exam  Vitals Wt Readings from Last 3 Encounters:  04/22/17 (!) 363 lb (164.7 kg)  04/15/17 (!) 366 lb 12 oz (166.4 kg)  03/17/17 (!) 368 lb 12.8 oz (167.3 kg)   Temp Readings from Last 3 Encounters:  04/22/17 98 F (36.7 C) (Oral)  04/15/17 98.4 F (36.9 C) (Temporal)  03/17/17 99 F (37.2 C) (Oral)   BP Readings from Last 3 Encounters:  04/22/17 130/75  04/15/17 130/72  03/17/17 129/60   Pulse Readings from Last 3 Encounters:  04/22/17 71  04/15/17 82  03/17/17 72   Constitutional: Well-developed, well-nourished, and in no distress.   HENT: Head: Normocephalic and atraumatic.  Mouth/Throat: No oropharyngeal exudate. Mucosa moist. Eyes: Pupils are equal, round, and reactive to light. Conjunctivae are normal. No scleral icterus.  Neck: Normal range of motion. Neck supple. No JVD present.  Cardiovascular: Normal rate, regular rhythm and normal heart sounds.  Exam reveals no gallop and no friction rub.   No murmur heard. Pulmonary/Chest: Effort normal and breath sounds normal. No respiratory distress. No wheezes.No rales.  Abdominal: Soft. Bowel sounds are normal. No distension. There is no tenderness. There is no guarding.  Musculoskeletal: No edema or tenderness.  Lymphadenopathy: No cervical, axillary or supraclavicular adenopathy.  Neurological: Alert and oriented to person, place, and time. No cranial nerve deficit.  Skin: Skin is warm and dry. No rash noted. No erythema. No pallor.  Psychiatric:  Affect and judgment normal.   Labs Appointment on 04/22/2017  Component Date Value Ref Range Status  . Prothrombin Time 04/22/2017 21.4* 11.4 - 15.2 seconds Final  . INR 04/22/2017 1.87   Final   Performed at [facility], [address] [place], [place] [address]     Pathology Orders Placed This Encounter  Procedures  . Protime-INR    Lab only    Standing Status:   Future    Standing Expiration Date:   04/23/2018  . CBC with Differential/Platelet    MD visit    Standing Status:  Future    Standing Expiration Date:   04/23/2018  . Comprehensive metabolic panel    MD visit    Standing Status:   Future    Standing Expiration Date:   04/23/2018  . Lactate dehydrogenase    MD visit    Standing Status:   Future    Standing Expiration Date:   04/23/2018  . Ferritin    MD visit    Standing Status:   Future    Standing Expiration Date:   04/23/2018  . Protime-INR    MD visit    Standing Status:   Future    Standing Expiration Date:   04/23/2018       Zoila Shutter MD

## 2017-04-25 ENCOUNTER — Other Ambulatory Visit (HOSPITAL_COMMUNITY): Payer: Self-pay

## 2017-04-28 ENCOUNTER — Inpatient Hospital Stay (HOSPITAL_COMMUNITY): Payer: Medicaid Other

## 2017-04-28 DIAGNOSIS — Z7901 Long term (current) use of anticoagulants: Secondary | ICD-10-CM

## 2017-04-28 DIAGNOSIS — I2602 Saddle embolus of pulmonary artery with acute cor pulmonale: Secondary | ICD-10-CM

## 2017-04-28 DIAGNOSIS — I2692 Saddle embolus of pulmonary artery without acute cor pulmonale: Secondary | ICD-10-CM | POA: Diagnosis not present

## 2017-04-28 LAB — PROTIME-INR
INR: 3.33
PROTHROMBIN TIME: 33.5 s — AB (ref 11.4–15.2)

## 2017-05-01 ENCOUNTER — Telehealth: Payer: Self-pay | Admitting: Family Medicine

## 2017-05-01 NOTE — Telephone Encounter (Signed)
Another note has been sent to PCP regarding this request.

## 2017-05-01 NOTE — Telephone Encounter (Signed)
Patient is requesting a refill of her pain medication Cb#: [place]

## 2017-05-01 NOTE — Telephone Encounter (Signed)
Please pull up the registry info for me to review. However, patient was given 60 pills on 04/15/2017. I will not refill her medications at this time. She is supposed to have an upcoming appointment with pain clinic. Please advise her that I will not refill her medication early. [name] Stabile, MD

## 2017-05-01 NOTE — Telephone Encounter (Signed)
Patient left message on nurse line stating that she has had a family emergency and is going to be out of town for a couple weeks and wants a refill on her pain medication to make sure she has some with her.

## 2017-05-02 NOTE — Telephone Encounter (Signed)
Below is Administrator, sports. She is aware that she will not get an early refill. She has not yet been contacted on a pain management appointment. Gwinda Passe, can you check on this?   Total Prescriptions: 27  Total Prescribers: 7  Total Pharmacies: 1  Narcotics*  (excluding buprenorphine) Current Qty: 6  Current MME/day: 45.00  30 Day Avg MME/day: 25.50  Sedatives*  Current Qty: 0  Current LME/day: 0.00  30 Day Avg LME/day: 0.00  Buprenorphine*  Current Qty: 0  Current mg/day: 0.00  30 Day Avg mg/day: 0.00  Rx Data   PRESCRIPTIONS Total Prescriptions: 27  Total Private Pay: 63  Fill Date ID Written Drug Qty Days Prescriber Rx # Pharmacy Refill Daily Dose * Pymt Type PMP  04/15/2017  1  04/15/2017  Oxycodone Hcl 10 Mg Tablet  60 20 Ra Hag  [id number]  Car ([id number])  0 45.00 MME Private Pay  [place]  03/13/2017  1  03/13/2017  Oxycodone Hcl 10 Mg Tablet  60 20 Ra Hag  [id number]  Car ([id number])  0 45.00 MME Private Pay  [place]  02/14/2017  1  02/14/2017  Oxycodone Hcl 10 Mg Tablet  45 15 Ra Hag  [id number]  Car ([id number])  0 45.00 MME Private Pay  [place]  01/31/2017  1  01/31/2017  Oxycodone Hcl 10 Mg Tablet  45 15 Ra Hag  [id number]  Car ([id number])  0 45.00 MME Private Pay  [place]  12/17/2016  1  12/16/2016  Oxycodone Hcl 10 Mg Tablet  45 12 Lo Zho  [id number]  Car (9744)  0 56.25 MME Private Pay  [place]  11/14/2016  1  11/13/2016  Oxycodone Hcl 10 Mg Tablet  45 12 Gr Daw  [id number]  Car (9744)  0 56.25 MME Private Pay  [place]  10/23/2016  1  10/23/2016  Oxycodone-Acetaminophen 5-325  20 2 Br Coo  [id number]  Car ([id number])  0 75.00 MME Medicaid  [place]  10/17/2016  1  10/16/2016  Oxycodone Hcl 10 Mg Tablet  45 12 Lo Zho  [id number]  Car (9744)  0 56.25 MME Private Pay  [place]  09/16/2016  1  09/16/2016  Oxycodone Hcl 10 Mg Tablet  45 11 Gr Daw  [id number]  Car (9744)  0 61.36 MME Private Pay  [place]  08/16/2016  1  08/15/2016  Oxycodone Hcl 10 Mg Tablet  45 12 Th Kef  [id number]  Car (9744)  0 56.25 MME Private Pay  [place]  07/18/2016  1  07/17/2016  Oxycodone Hcl 10 Mg  Tablet  45 12 Gr Daw  [id number]  Car (9744)  0 56.25 MME Private Pay  [place]  06/17/2016  1  06/12/2016  Oxycodone Hcl 10 Mg Tablet  45 12 Th Kef  [id number]  Car (9744)  0 56.25 MME Private Pay  [place]  05/22/2016  1  05/16/2016  Oxycodone Hcl 10 Mg Tablet  45 12 Gr Daw  [id number]  Car (9744)  0 56.25 MME Private Pay  [place]  04/22/2016  1  04/17/2016  Oxycodone Hcl 10 Mg Tablet  45 12 Th Kef  [id number]  Car (9744)  0 56.25 MME Private Pay  [place]  03/21/2016  1  03/21/2016  Oxycodone Hcl 10 Mg Tablet  45 12 Th Kef  [id number]  Car (9744)  0 56.25 MME Private Pay  [place]  02/22/2016  1  02/21/2016  Oxycodone Hcl 10 Mg Tablet  45 12 Th Kef  [id number]  Car (9744)  0 56.25  MME Private Pay  [place]  01/24/2016  1  01/24/2016  Oxycodone Hcl 10 Mg Tablet  45 12 Th Kef  [id number]  Car (9744)  0 56.25 MME Private Pay  [place]  12/25/2015  1  12/22/2015  Oxycodone Hcl 10 Mg Tablet  45 12 Th Kef  [id number]  Car (9744)  0 56.25 MME Medicaid  [place]  11/28/2015  1  11/27/2015  Oxycodone Hcl 10 Mg Tablet  45 12 Th Kef  [id number]  Car (9744)  0 56.25 MME Medicaid  [place]  11/01/2015  1  11/01/2015  Oxycodone Hcl 10 Mg Tablet  45 12 Th Kef  [id number]  Car (9744)  0 56.25 MME Medicaid  [place]  10/06/2015  1  10/06/2015  Oxycodone Hcl 10 Mg Tablet  45 12 Sh Pen  [id number]  Car (9744)  0 56.25 MME Medicaid  [place]  09/07/2015  1  09/06/2015  Oxycodone Hcl 10 Mg Tablet  45 12 Th Kef  [id number]  Car (9744)  0 56.25 MME Medicaid  [place]  08/14/2015  1  08/14/2015  Diazepam 5 Mg Tablet  14 7 Ta Tri  [id number]  Car (9744)  0 1.00 LME Medicaid  [place]  08/11/2015  1  08/10/2015  Oxycodone Hcl 10 Mg Tablet  45 12 Th Kef  [id number]  Car (9744)  0 56.25 MME Medicaid  [place]  07/17/2015  2  07/17/2015  Oxycodone Hcl 10 Mg Tablet  45 12 Th Kef  [id number]  Car (9744)  0 56.25 MME Medicaid  [place]  06/20/2015  2  06/20/2015  Oxycodone Hcl 10 Mg Tablet  45 12 Th Kef  [id number]  Car (9744)  0 56.25 MME Medicaid  [place]  05/24/2015  2  05/23/2015  Oxycodone Hcl 10 Mg Tablet  45 12 Th Kef  [id number]  Car (9744)  0  56.25 MME Medicaid    *Per CDC guidance, the MME conversion factors prescribed or provided as part of the medication-assisted treatment for opioid use disorder should not be used to benchmark against dosage thresholds meant for opioids prescribed for pain. Buprenorphine products have no agreed upon morphine equivalency, and as partial opioid agonists, are not expected to be associated with overdose risk in the same dose-dependent manner as doses for full agonist opioids. MME = morphine milligram equivalents. LME = Lorazepam milligram equivalents. mg = dose in milligrams.  Providers Total Providers: 7  Name [place] Phone  Roxy Manns Kefalas  [place] [address] -  Gretchen W Dawson  [address] [place] [address] -  Shannon Kristen Penland  [place] [address] -  Tammy L Triplett  [place] [address] -  [place]  [address] [address] -  Brian Cook  [place] [address] -  Rachel H. Mannie Stabile, [place] [address] -  Pharmacies Total Pharmacies: 1  Name [place] [phone]) [place] [address] [phone]

## 2017-05-06 ENCOUNTER — Inpatient Hospital Stay (HOSPITAL_COMMUNITY): Payer: Medicaid Other

## 2017-05-06 DIAGNOSIS — I2602 Saddle embolus of pulmonary artery with acute cor pulmonale: Secondary | ICD-10-CM

## 2017-05-06 DIAGNOSIS — I2692 Saddle embolus of pulmonary artery without acute cor pulmonale: Secondary | ICD-10-CM | POA: Diagnosis not present

## 2017-05-06 DIAGNOSIS — Z7901 Long term (current) use of anticoagulants: Secondary | ICD-10-CM

## 2017-05-06 LAB — PROTIME-INR
INR: 3.27
Prothrombin Time: 33.1 seconds — ABNORMAL HIGH (ref 11.4–15.2)

## 2017-05-13 ENCOUNTER — Inpatient Hospital Stay (HOSPITAL_COMMUNITY): Payer: Medicaid Other

## 2017-05-13 DIAGNOSIS — I2692 Saddle embolus of pulmonary artery without acute cor pulmonale: Secondary | ICD-10-CM | POA: Diagnosis not present

## 2017-05-13 DIAGNOSIS — I2602 Saddle embolus of pulmonary artery with acute cor pulmonale: Secondary | ICD-10-CM

## 2017-05-13 DIAGNOSIS — Z7901 Long term (current) use of anticoagulants: Secondary | ICD-10-CM

## 2017-05-13 LAB — PROTIME-INR
INR: 1.78
Prothrombin Time: 20.5 seconds — ABNORMAL HIGH (ref 11.4–15.2)

## 2017-05-14 ENCOUNTER — Other Ambulatory Visit: Payer: Self-pay

## 2017-05-14 DIAGNOSIS — M5410 Radiculopathy, site unspecified: Secondary | ICD-10-CM

## 2017-05-14 NOTE — Telephone Encounter (Signed)
Patient called requesting refill on her oxycodone. Please call back [phone]

## 2017-05-15 MED ORDER — OXYCODONE HCL 10 MG PO TABS: 10.0000 mg | ORAL_TABLET | Freq: Three times a day (TID) | ORAL | 0 refills | Status: DC | PRN

## 2017-05-15 NOTE — Telephone Encounter (Signed)
Please send me refill of medication and I will refill at this time but she needs to get further refills from pain clinic.  [name] [name] has placed a referral for the pain clinic and she should hear about this appointment.

## 2017-05-15 NOTE — Telephone Encounter (Signed)
Just let me know when this is sent and I will call her.

## 2017-05-15 NOTE — Telephone Encounter (Signed)
Summary   Summary  Total Prescriptions: 27  Total Prescribers: 7  Total Pharmacies: 1  Narcotics*  (excluding buprenorphine) Current Qty: 0  Current MME/day: 0.00  30 Day Avg MME/day: 30.00  Sedatives*  Current Qty: 0  Current LME/day: 0.00  30 Day Avg LME/day: 0.00  Buprenorphine*  Current Qty: 0  Current mg/day: 0.00  30 Day Avg mg/day: 0.00  Rx Data   PRESCRIPTIONS Total Prescriptions: 27  Total Private Pay: 73  Fill Date ID Written Drug Qty Days Prescriber Rx # Pharmacy Refill Daily Dose * Pymt Type PMP  04/15/2017  1  04/15/2017  Oxycodone Hcl 10 Mg Tablet  60 20 Ra Hag  [id number]  Car ([id number])  0 45.00 MME Private Pay  [place]  03/13/2017  1  03/13/2017  Oxycodone Hcl 10 Mg Tablet  60 20 Ra Hag  [id number]  Car ([id number])  0 45.00 MME Private Pay  [place]  02/14/2017  1  02/14/2017  Oxycodone Hcl 10 Mg Tablet  45 15 Ra Hag  [id number]  Car (9744)  0 45.00 MME Private Pay  [place]  01/31/2017  1  01/31/2017  Oxycodone Hcl 10 Mg Tablet  45 15 Ra Hag  [id number]  Car (9744)  0 45.00 MME Private Pay  [place]  12/17/2016  1  12/16/2016  Oxycodone Hcl 10 Mg Tablet  45 12 Lo Zho  [id number]  Car (9744)  0 56.25 MME Private Pay  [place]  11/14/2016  1  11/13/2016  Oxycodone Hcl 10 Mg Tablet  45 12 Gr Daw  [id number]  Car (9744)  0 56.25 MME Private Pay  [place]  10/23/2016  1  10/23/2016  Oxycodone-Acetaminophen 5-325  20 2 Br Coo  [id number]  Car (9744)  0 75.00 MME Medicaid  [place]  10/17/2016  1  10/16/2016  Oxycodone Hcl 10 Mg Tablet  45 12 Lo Zho  [id number]  Car (9744)  0 56.25 MME Private Pay  [place]  09/16/2016  1  09/16/2016  Oxycodone Hcl 10 Mg Tablet  45 11 Gr Daw  [id number]  Car (9744)  0 61.36 MME Private Pay  [place]  08/16/2016  1  08/15/2016  Oxycodone Hcl 10 Mg Tablet  45 12 Th Kef  [id number]  Car (9744)  0 56.25 MME Private Pay  [place]  07/18/2016  1  07/17/2016  Oxycodone Hcl 10 Mg Tablet  45 12 Gr Daw  [id number]  Car (9744)  0 56.25 MME Private Pay  [place]  06/17/2016  1  06/12/2016  Oxycodone Hcl 10 Mg Tablet  45 12 Th Kef  [id number]  Car  (9744)  0 56.25 MME Private Pay  [place]  05/22/2016  1  05/16/2016  Oxycodone Hcl 10 Mg Tablet  45 12 Gr Daw  [id number]  Car (9744)  0 56.25 MME Private Pay  [place]  04/22/2016  1  04/17/2016  Oxycodone Hcl 10 Mg Tablet  45 12 Th Kef  [id number]  Car (9744)  0 56.25 MME Private Pay  [place]  03/21/2016  1  03/21/2016  Oxycodone Hcl 10 Mg Tablet  45 12 Th Kef  [id number]  Car (9744)  0 56.25 MME Private Pay  [place]  02/22/2016  1  02/21/2016  Oxycodone Hcl 10 Mg Tablet  45 12 Th Kef  [id number]  Car (9744)  0 56.25 MME Private Pay    01/24/2016  1  01/24/2016  Oxycodone Hcl 10 Mg Tablet  45 12 Th Kef  [id number]  Car (9744)  0  56.25 MME Private Pay  [place]  12/25/2015  1  12/22/2015  Oxycodone Hcl 10 Mg Tablet  45 12 Th Kef  [id number]  Car (9744)  0 56.25 MME Medicaid  [place]  11/28/2015  1  11/27/2015  Oxycodone Hcl 10 Mg Tablet  45 12 Th Kef  [id number]  Car (9744)  0 56.25 MME Medicaid  [place]  11/01/2015  1  11/01/2015  Oxycodone Hcl 10 Mg Tablet  45 12 Th Kef  [id number]  Car (9744)  0 56.25 MME Medicaid  [place]  10/06/2015  1  10/06/2015  Oxycodone Hcl 10 Mg Tablet  45 12 Sh Pen  [id number]  Car (9744)  0 56.25 MME Medicaid  [place]  09/07/2015  1  09/06/2015  Oxycodone Hcl 10 Mg Tablet  45 12 Th Kef  [id number]  Car (9744)  0 56.25 MME Medicaid  [place]  08/14/2015  1  08/14/2015  Diazepam 5 Mg Tablet  14 7 Ta Tri  [id number]  Car (9744)  0 1.00 LME Medicaid  [place]  08/11/2015  1  08/10/2015  Oxycodone Hcl 10 Mg Tablet  45 12 Th Kef  [id number]  Car (9744)  0 56.25 MME Medicaid  [place]  07/17/2015  2  07/17/2015  Oxycodone Hcl 10 Mg Tablet  45 12 Th Kef  [id number]  Car (9744)  0 56.25 MME Medicaid  [place]  06/20/2015  2  06/20/2015  Oxycodone Hcl 10 Mg Tablet  45 12 Th Kef  [id number]  Car (9744)  0 56.25 MME Medicaid  [place]  05/24/2015  2  05/23/2015  Oxycodone Hcl 10 Mg Tablet  45 12 Th Kef  [id number]  Car (9744)  0 56.25 MME Medicaid  [place]  *Per CDC guidance, the MME conversion factors prescribed or provided as part of the medication-assisted treatment for opioid use  disorder should not be used to benchmark against dosage thresholds meant for opioids prescribed for pain. Buprenorphine products have no agreed upon morphine equivalency, and as partial opioid agonists, are not expected to be associated with overdose risk in the same dose-dependent manner as doses for full agonist opioids. MME = morphine milligram equivalents. LME = Lorazepam milligram equivalents. mg = dose in milligrams.  Providers Total Providers: 7  Name [place] Phone  Roxy Manns Kefalas  [place] [address] -  Gretchen W Dawson  [address] [place] [address] -  Shannon Kristen Penland  [place] [address] -  Tammy L Triplett  [place] [address] -  [place]  [address] [address] -  Brian Cook  [place] [address] -  Rachel H. Mannie Stabile, [place] [phone] -  Pharmacies Total Pharmacies: 1  Name [place] [phone]) [place] [address] [phone]

## 2017-05-16 ENCOUNTER — Telehealth (HOSPITAL_COMMUNITY): Payer: Self-pay

## 2017-05-16 DIAGNOSIS — I2602 Saddle embolus of pulmonary artery with acute cor pulmonale: Secondary | ICD-10-CM

## 2017-05-16 DIAGNOSIS — I2782 Chronic pulmonary embolism: Secondary | ICD-10-CM

## 2017-05-16 MED ORDER — WARFARIN SODIUM 1 MG PO TABS: 1.0000 mg | ORAL_TABLET | Freq: Every day | ORAL | 3 refills | Status: DC

## 2017-05-16 MED ORDER — WARFARIN SODIUM 2.5 MG PO TABS
2.5000 mg | ORAL_TABLET | Freq: Every day | ORAL | 3 refills | Status: DC
Start: 2017-05-16 — End: 2017-11-06

## 2017-05-16 MED ORDER — WARFARIN SODIUM 10 MG PO TABS: 10.0000 mg | ORAL_TABLET | Freq: Every day | ORAL | 3 refills | Status: DC

## 2017-05-16 NOTE — Telephone Encounter (Signed)
Patient called needing coumadin dosage to equal 13.5 mg. Reviewed with provider. New prescriptions for coumadin 10 mg , 2.5 mg and 1 mg sent to patients pharmacy.

## 2017-05-22 ENCOUNTER — Other Ambulatory Visit (HOSPITAL_COMMUNITY): Payer: Self-pay

## 2017-05-22 ENCOUNTER — Ambulatory Visit: Payer: Self-pay | Admitting: Family Medicine

## 2017-05-23 ENCOUNTER — Inpatient Hospital Stay (HOSPITAL_COMMUNITY): Payer: Medicaid Other | Attending: Hematology

## 2017-05-23 DIAGNOSIS — I2692 Saddle embolus of pulmonary artery without acute cor pulmonale: Secondary | ICD-10-CM | POA: Insufficient documentation

## 2017-05-23 DIAGNOSIS — I2782 Chronic pulmonary embolism: Secondary | ICD-10-CM

## 2017-05-23 DIAGNOSIS — Z7901 Long term (current) use of anticoagulants: Secondary | ICD-10-CM | POA: Insufficient documentation

## 2017-05-23 DIAGNOSIS — I2602 Saddle embolus of pulmonary artery with acute cor pulmonale: Secondary | ICD-10-CM

## 2017-05-23 LAB — PROTIME-INR
INR: 2.04
Prothrombin Time: 22.9 seconds — ABNORMAL HIGH (ref 11.4–15.2)

## 2017-05-28 NOTE — Telephone Encounter (Signed)
Pharmacy called to verify Coumadin dosage.  Pulled last communication note and read the note to the pharmacist.  Pharmacist verbalized understanding.

## 2017-06-10 ENCOUNTER — Ambulatory Visit: Payer: Self-pay | Admitting: Family Medicine

## 2017-06-12 ENCOUNTER — Other Ambulatory Visit: Payer: Self-pay

## 2017-06-12 ENCOUNTER — Ambulatory Visit (INDEPENDENT_AMBULATORY_CARE_PROVIDER_SITE_OTHER): Payer: Medicaid Other | Admitting: Family Medicine

## 2017-06-12 ENCOUNTER — Encounter: Payer: Self-pay | Admitting: Family Medicine

## 2017-06-12 VITALS — BP 126/64 | HR 89 | Temp 98.6°F | Resp 20 | Ht 70.0 in | Wt 362.0 lb

## 2017-06-12 DIAGNOSIS — M5410 Radiculopathy, site unspecified: Secondary | ICD-10-CM

## 2017-06-12 DIAGNOSIS — I10 Essential (primary) hypertension: Secondary | ICD-10-CM | POA: Diagnosis not present

## 2017-06-12 MED ORDER — OXYCODONE HCL 10 MG PO TABS: 10.0000 mg | ORAL_TABLET | Freq: Three times a day (TID) | ORAL | 0 refills | Status: DC | PRN

## 2017-06-12 NOTE — Progress Notes (Signed)
Patient ID: [name] [name], female    DOB: 10-16-1975, 42 y.o.   MRN: [id number]  Chief Complaint  Patient presents with  . Hypertension    follow up    Allergies Sulfa antibiotics  Subjective:   [name] [name] is a 42 y.o. female who presents to [facility] today.  HPI Here for follow up. Has not been able to get into the pain clinic. Had to cancel the appointment b/c did not have medicaid. Medicaid ended up getting messed up and was switched to family planning medicaid. So could not come into to office visit here either. Has been taking all of BP medications.  Reports that she has cut down on the amount she is smoking each day.  Is only smoking about 3 cigarettes a day.  Has not been able to lose any weight yet.  Reports her mood is much better.  Reports that she has gotten over the stress and sadness of knowing that the father of her child is not going to be in a picture to help take care of her.  She reports her mood is good.  Energy is better.  Would like to get a refill on the pain medications.  Has stretched out with Percocet and usually takes it twice a day.  Does not use it more than that unless is very active with her daughter.  Denies any chest pain, shortness of breath.  Reports that the swelling in her legs is gotten better.  Is now on Coumadin and completely off the Lovenox injections.  Still being followed at hematology oncology.  They are monitoring her for iron deficiency occasionally.  Had cholesterol checked several months ago at our office.  LDL was within normal limits.   Past Medical History:  Diagnosis Date  . Abdominal cramps 08/30/2015  . Anemia   . Blood transfusion without reported diagnosis   . Closed left ankle fracture    August 30 2012  . Clotting disorder ([place])   . DVT (deep venous thrombosis) ([facility])    L leg  . Fibroid tumor   . Hidradenitis   . High blood pressure   . Hypertension   . Obesity   . OSA on CPAP   . Oxygen deficiency     . Pregnancy    09/20/14 17 weeks  . Pulmonary embolism ([place]) 04/2013  . Sleep apnea   . Tobacco use disorder     Past Surgical History:  Procedure Laterality Date  . ABSCESS DRAINAGE Bilateral 01/10/15  . CYST REMOVAL TRUNK    . IVC FILTER PLACEMENT ([facility])      Family History  Problem Relation Age of Onset  . Hypertension Mother   . Diabetes Paternal Aunt   . Diabetes Paternal Uncle   . Other Father        blood clots  . Cancer Maternal Grandmother   . Heart attack Paternal Grandmother      Social History   Socioeconomic History  . Marital status: Widowed    Spouse name: Not on file  . Number of children: Not on file  . Years of education: Not on file  . Highest education level: Not on file  Occupational History  . Not on file  Social Needs  . Financial resource strain: Not on file  . Food insecurity:    Worry: Not on file    Inability: Not on file  . Transportation needs:    Medical: Not on file  Non-medical: Not on file  Tobacco Use  . Smoking status: Current Some Day Smoker    Packs/day: 0.25    Years: 18.00    Pack years: 4.50    Types: Cigarettes  . Smokeless tobacco: Never Used  . Tobacco comment: smokes 3 cig when she smokes  Substance and Sexual Activity  . Alcohol use: Yes    Alcohol/week: 1.2 oz    Types: 2 Cans of beer per week  . Drug use: No  . Sexual activity: Not Currently    Birth control/protection: Pill  Lifestyle  . Physical activity:    Days per week: Not on file    Minutes per session: Not on file  . Stress: Not on file  Relationships  . Social connections:    Talks on phone: Not on file    Gets together: Not on file    Attends religious service: Not on file    Active member of club or organization: Not on file    Attends meetings of clubs or organizations: Not on file    Relationship status: Not on file  Other Topics Concern  . 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    Has a 41 year old Pearson.   Live with parents.   Was working 5 days a weeks.   Not working right now.    Attends church.    Current Outpatient Medications on File Prior to Visit  Medication Sig Dispense Refill  . amLODipine (NORVASC) 10 MG tablet Take 1 tablet (10 mg total) by mouth daily. 90 tablet 3  . lisinopril (PRINIVIL,ZESTRIL) 40 MG tablet Take 1 tablet (40 mg total) by mouth daily. 90 tablet 3  . norethindrone (MICRONOR,CAMILA,ERRIN) 0.35 MG tablet TAKE ONE TABLET BY MOUTH ONCE DAILY 28 tablet 3  . Oxycodone HCl 10 MG TABS Take 1 tablet (10 mg total) by mouth every 8 (eight) hours as needed. 60 tablet 0  . triamterene-hydrochlorothiazide (MAXZIDE-25) 37.5-25 MG tablet TAKE 1 TABLET BY MOUTH ONCE DAILY 30 tablet 11  . warfarin (COUMADIN) 1 MG tablet Take 1 tablet (1 mg total) by mouth daily. 30 tablet 3  . warfarin (COUMADIN) 10 MG tablet Take 1 tablet (10 mg total) by mouth daily. 30 tablet 3  . warfarin (COUMADIN) 2.5 MG tablet Take 1 tablet (2.5 mg total) by mouth daily. 30 tablet 3   Current Facility-Administered Medications on File Prior to Visit  Medication Dose Route Frequency Provider Last Rate Last Dose  . 0.9 %  sodium chloride infusion   Intravenous Continuous [facility], PA-C 20 mL/hr at 12/22/15 1430      Review of Systems  Constitutional: Negative for activity change, appetite change and fever.  Eyes: Negative for visual disturbance.  Respiratory: Negative for cough, chest tightness and shortness of breath.   Cardiovascular: Negative for chest pain, palpitations and leg swelling.  Gastrointestinal: Negative for abdominal pain, nausea and vomiting.  Genitourinary: Negative for dysuria, frequency and urgency.  Musculoskeletal:       Reports back pain is unchanged.  Still dealing with pain.  Does not wish to have surgery.  No difficulty with urination.  Skin: Negative for rash.  Neurological: Negative for dizziness, syncope and light-headedness.   Hematological: Negative for adenopathy.  Psychiatric/Behavioral: Negative for behavioral problems, dysphoric mood and sleep disturbance.     Objective:   BP 126/64 (BP Location: Right Arm, Patient Position: Sitting, Cuff  Size: Large)   Pulse 89   Temp 98.6 F (37 C) (Temporal)   Resp 20   Ht 5\' 10"  (1.778 m)   Wt (!) 362 lb 0.6 oz (164.2 kg)   SpO2 99%   BMI 51.95 kg/m   Physical Exam  Constitutional: She is oriented to person, place, and time. She appears well-developed and well-nourished. No distress.  HENT:  Head: Normocephalic and atraumatic.  Eyes: Pupils are equal, round, and reactive to light.  Neck: Normal range of motion. Neck supple. No thyromegaly present.  Cardiovascular: Normal rate, regular rhythm and normal heart sounds.  Pulmonary/Chest: Effort normal and breath sounds normal. No respiratory distress.  Neurological: She is alert and oriented to person, place, and time. No cranial nerve deficit.  Skin: Skin is warm and dry.  Psychiatric: She has a normal mood and affect. Her behavior is normal. Judgment and thought content normal.  Nursing note and vitals reviewed.   Depression screen [facility] 2/9 06/12/2017 04/15/2017 01/31/2017 01/27/2014  Decreased Interest 0 0 0 0  Down, Depressed, Hopeless 0 0 0 0  PHQ - 2 Score 0 0 0 0    Assessment and Plan  1. Benign essential hypertension Continue blood pressure medication as directed. Lifestyle modifications discussed with patient including a diet emphasizing vegetables, fruits, and whole grains. Limiting intake of sodium to less than 2,400 mg per day.  Recommendations discussed include consuming low-fat dairy products, poultry, fish, legumes, non-tropical vegetable oils, and nuts; and limiting intake of sweets, sugar-sweetened beverages, and red meat. Discussed following a plan such as the Dietary Approaches to Stop Hypertension (DASH) diet. Patient to read up on this diet.  Patient counseled in detail regarding the risks  of medication. Told to call or return to clinic if develop any worrisome signs or symptoms. Patient voiced understanding.    2. Back pain with left-sided radiculopathy Will place referral again to the pain clinic so that we can reauthorize and get patient appointment.  Her last appointment had to be canceled due to the fact that she did not have her Medicaid at that time.  She understands worrisome signs and symptoms of low back pain and if those develop she will call or return to office.  He had again we did discuss side effects associated with pain medication and risks.  She voiced understanding. - Oxycodone HCl 10 MG TABS; Take 1 tablet (10 mg total) by mouth every 8 (eight) hours as needed.  Dispense: 60 tablet; Refill: 0 - Ambulatory referral to Pain Clinic   Patient will continue to try to cut down on her tobacco use.  Smoking cessation encouraged.  Weight loss was yet again discussed with patient. No follow-ups on file. Caren Macadam, MD 06/12/2017

## 2017-06-20 ENCOUNTER — Telehealth: Payer: Self-pay | Admitting: Family Medicine

## 2017-06-20 ENCOUNTER — Other Ambulatory Visit: Payer: Self-pay | Admitting: Family Medicine

## 2017-06-20 NOTE — Telephone Encounter (Signed)
Patient called in to let you know that she is scheduled with [facility] on 08/07/17

## 2017-06-27 ENCOUNTER — Encounter: Payer: Self-pay | Admitting: Family Medicine

## 2017-07-09 ENCOUNTER — Telehealth: Payer: Self-pay | Admitting: Family Medicine

## 2017-07-09 NOTE — Telephone Encounter (Signed)
Pt is requesting a refill on Oxcoydone 10mg 

## 2017-07-14 ENCOUNTER — Telehealth: Payer: Self-pay | Admitting: Family Medicine

## 2017-07-14 DIAGNOSIS — M5410 Radiculopathy, site unspecified: Secondary | ICD-10-CM

## 2017-07-14 MED ORDER — OXYCODONE HCL 10 MG PO TABS: 10.0000 mg | ORAL_TABLET | Freq: Three times a day (TID) | ORAL | 0 refills | Status: DC | PRN

## 2017-07-14 NOTE — Telephone Encounter (Signed)
Pt calling in wanting to know the status

## 2017-07-14 NOTE — Telephone Encounter (Signed)
Patient notified that oxycodone refills have to come from pain management from now on with verbal understanding.

## 2017-07-14 NOTE — Telephone Encounter (Signed)
Advise that oxycodone was sent in, but future refills need to come from pain management Tramond Slinker H. Mannie Stabile, MD

## 2017-07-14 NOTE — Telephone Encounter (Signed)
Done. [name] [name]. Mannie Stabile, MD

## 2017-07-15 NOTE — Telephone Encounter (Signed)
Patient being referred to Coumadin Clinic for INR management. I let patient know about this and patient is fine with the transfer of INR management to the Coumadin Clinic. Patient will be seen by Dr. Walden Field in July and will continue follow up with [place].

## 2017-07-31 ENCOUNTER — Inpatient Hospital Stay (HOSPITAL_BASED_OUTPATIENT_CLINIC_OR_DEPARTMENT_OTHER): Payer: Medicaid Other | Admitting: Internal Medicine

## 2017-07-31 ENCOUNTER — Inpatient Hospital Stay (HOSPITAL_COMMUNITY): Payer: Medicaid Other | Attending: Hematology

## 2017-07-31 ENCOUNTER — Encounter (HOSPITAL_COMMUNITY): Payer: Self-pay | Admitting: Internal Medicine

## 2017-07-31 DIAGNOSIS — D509 Iron deficiency anemia, unspecified: Secondary | ICD-10-CM | POA: Insufficient documentation

## 2017-07-31 DIAGNOSIS — I2602 Saddle embolus of pulmonary artery with acute cor pulmonale: Secondary | ICD-10-CM

## 2017-07-31 DIAGNOSIS — I10 Essential (primary) hypertension: Secondary | ICD-10-CM | POA: Diagnosis not present

## 2017-07-31 DIAGNOSIS — Z720 Tobacco use: Secondary | ICD-10-CM

## 2017-07-31 DIAGNOSIS — R0602 Shortness of breath: Secondary | ICD-10-CM

## 2017-07-31 DIAGNOSIS — I2782 Chronic pulmonary embolism: Secondary | ICD-10-CM

## 2017-07-31 DIAGNOSIS — Z7901 Long term (current) use of anticoagulants: Secondary | ICD-10-CM | POA: Insufficient documentation

## 2017-07-31 LAB — CBC WITH DIFFERENTIAL/PLATELET
Basophils Absolute: 0 10*3/uL (ref 0.0–0.1)
Basophils Relative: 1 %
Eosinophils Absolute: 0.3 10*3/uL (ref 0.0–0.7)
Eosinophils Relative: 4 %
HEMATOCRIT: 33.4 % — AB (ref 36.0–46.0)
Hemoglobin: 10.7 g/dL — ABNORMAL LOW (ref 12.0–15.0)
LYMPHS PCT: 24 %
Lymphs Abs: 1.6 10*3/uL (ref 0.7–4.0)
MCH: 30.4 pg (ref 26.0–34.0)
MCHC: 32 g/dL (ref 30.0–36.0)
MCV: 94.9 fL (ref 78.0–100.0)
MONO ABS: 0.4 10*3/uL (ref 0.1–1.0)
MONOS PCT: 7 %
Neutro Abs: 4.3 10*3/uL (ref 1.7–7.7)
Neutrophils Relative %: 64 %
Platelets: 269 10*3/uL (ref 150–400)
RBC: 3.52 MIL/uL — ABNORMAL LOW (ref 3.87–5.11)
RDW: 16.2 % — AB (ref 11.5–15.5)
WBC: 6.7 10*3/uL (ref 4.0–10.5)

## 2017-07-31 LAB — LACTATE DEHYDROGENASE: LDH: 111 U/L (ref 98–192)

## 2017-07-31 LAB — COMPREHENSIVE METABOLIC PANEL
ALBUMIN: 3.4 g/dL — AB (ref 3.5–5.0)
ALK PHOS: 51 U/L (ref 38–126)
ALT: 14 U/L (ref 0–44)
AST: 15 U/L (ref 15–41)
Anion gap: 7 (ref 5–15)
BUN: 13 mg/dL (ref 6–20)
CALCIUM: 8.7 mg/dL — AB (ref 8.9–10.3)
CHLORIDE: 112 mmol/L — AB (ref 98–111)
CO2: 23 mmol/L (ref 22–32)
CREATININE: 0.82 mg/dL (ref 0.44–1.00)
GFR calc Af Amer: >60 mL/min (ref >60)
GFR calc non Af Amer: >60 mL/min (ref >60)
GLUCOSE: 93 mg/dL (ref 70–99)
Potassium: 4.3 mmol/L (ref 3.5–5.1)
SODIUM: 142 mmol/L (ref 135–145)
Total Bilirubin: 0.2 mg/dL — ABNORMAL LOW (ref 0.3–1.2)
Total Protein: 7.4 g/dL (ref 6.5–8.1)

## 2017-07-31 LAB — PROTIME-INR
INR: 3.03
Prothrombin Time: 31.1 seconds — ABNORMAL HIGH (ref 11.4–15.2)

## 2017-07-31 LAB — FERRITIN: Ferritin: 25 ng/mL (ref 11–307)

## 2017-07-31 NOTE — Progress Notes (Signed)
Per Dr. Walden Field request, I called patient and let her know her INR was 3 and to continue the same dose of coumadin that she is currently taking. Also encouraged patient to keep appt with coumadin clinic.

## 2017-07-31 NOTE — Patient Instructions (Signed)
[facility] at [facility] Discharge Instructions  You saw Dr. Higgs today.   Thank you for choosing [facility] at [facility] to provide your oncology and hematology care.  To afford each patient quality time with our provider, please arrive at least 15 minutes before your scheduled appointment time.   If you have a lab appointment with the Cancer Center please come in thru the  Main Entrance and check in at the main information desk  You need to re-schedule your appointment should you arrive 10 or more minutes late.  We strive to give you quality time with our providers, and arriving late affects you and other patients whose appointments are after yours.  Also, if you no show three or more times for appointments you may be dismissed from the clinic at the providers discretion.     Again, thank you for choosing [facility].  Our hope is that these requests will decrease the amount of time that you wait before being seen by our physicians.       _____________________________________________________________  Should you have questions after your visit to [facility], please contact our office at [phone] between the hours of 8:30 a.m. and 4:30 p.m.  Voicemails left after 4:30 p.m. will not be returned until the following business day.  For prescription refill requests, have your pharmacy contact our office.       Resources For Cancer Patients and their Caregivers ? American Cancer Society: Can assist with transportation, wigs, general needs, runs Look Good Feel Better.        1-888-227-6333 ? Cancer Care: Provides financial assistance, online support groups, medication/co-pay assistance.  1-800-813-HOPE (4673) ? [facility] Assists [place] cancer patients and their families through emotional , educational and financial support.  336-427-4357 ? [place] DSS Where to apply for food  stamps, Medicaid and utility assistance. 336-342-1394 ? [facility]: Transportation to medical appointments. 336-347-2287 ? Social Security Administration: May apply for disability if have a Stage IV cancer. 336-342-7796 1-800-772-1213 ? [place] Aging, Disability and Transit Services: Assists with nutrition, care and transit needs. 336-349-2343  Cancer Center Support Programs:   > Cancer Support Group  2nd Tuesday of the month 1pm-2pm, Journey Room   > Creative Journey  3rd Tuesday of the month 1130am-1pm, Journey Room     

## 2017-07-31 NOTE — Progress Notes (Signed)
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  Assessment and Plan:  1.  Saddle PE.  Patient is scheduled to establish with Coumadin Clinic.  INR today is 3.  She should continue current coumadin dose and folllow-up with coumadin clinic on 08/27/2017.  She will be seen for follow-up in 10/2017.    2. Iron deficiency anemia.  She was given Feraheme in February 2019.  Labs done 07/31/2017 reviewed with pt and shows WBC 6.7 HB 10.7 plts 269,000.  Awaiting ferritin results and pt will be notified.  Pending results she will RTC in 10/2017 for follow-up and repeat labs.  She is referred to GI for evaluation.     3.  Smoking.  Cessation is recommended especially due to history of thrombosis.  Will determine if she will be a candidate for lung cancer screening program.    4.  HTN.  BP is 136/74.  Follow-up with PCP.    5.  SOB.  INR is 3.  Pulse ox is 100% on room air.  If symptoms worsen she should notify the office and will consider repeat CTA.    6.  Health maintenance.  She has undergone mammogram in February 27, 2017 that was negative and patient should have repeat mammogram in February 2020.  She is referred to GI for evaluation.     INTERVAL HISTORY:  Historical data obtained from note dated 04/22/2017.  42 year old female with saddle PE, unprovoked with CT evidence of right strain and clot extending from the distal main pulmonary artery to the left and right pulmonary artery extension into the LUL, LLL and lingula branches. She was hospitalized for right heart strain and had IVC filter placement on 04/2013.  She was seen at [facility] coagulation clinic in March 2017 for lifelong anticoagulation. She was treated in the past with lovenox bridge for coumadin.  She is currently on  coumadin.   Pt also has a history of Iron deficiency anemia and was treated with  IV iron replacement therapy.  She had a second opinion at [facility]: Patient Instructions - [name], MD - 04/18/2015 9:20 AM EDT You should continue on blood thinners long term due to your previous unprovoked clots, especially given the severe nature of your lung clots. Similar to my recommendations at your initial visit, I would not recommend transitioning to one of the new oral anticoagulants like Xarelto at this time. These medications have not been studied well in individuals with weights >~250 pounds, so we don't know how effective they are in this population. As such, if you wish to change from the Lovenox shots to an oral medication, I would recommend warfarin or Coumadin. You would start this and overlap it with Lovenox until your Coumadin levels are in the therapeutic range. This therapeutic range is an INR of 2-3. You will need to have someone locally to follow your INR levels and adjust Coumadin doses as needed. This is typically done by a primary care doctor, although Dr. Whitney Muse may be able to assist as well. You should be mindful of having stable intake of vitamin K containing foods while on Coumadin. You do not need to eliminate them from your diet but just eat a similar amount every week. Otherwise it will make keeping your Coumadin levels in the therapeutic range of difficult task. I anticipate  having data in the next few years on the use of the new oral anticoagulants at the extremes of the body weight spectrum, so likely we can transitioning to one of these medicines in the future.  Regarding your left leg pain symptoms, I imagine it is due to something called post-thrombotic syndrome. The main treatment for this is to wear compression stockings. Try the over-the-counter type first and if no improvement then you can fill the prescription provided today for medical grade stockings. If ultimately you  cannot tolerate the stockings or they do not improve symptoms significantly, then there is no need to continue wearing them. I recommend that you avoid any estrogen containing systemic hormonal preparations. The safest hormonal option for birth control is the [place] or [place] IUDs. The next safest options are the progesterone only pills or other progesterone only preparations. Taking them while on anticoagulation is likely safe, but again I would recommend avoiding estrogen containing preparations. If you were to become pregnant again in the future, you would need to be treated with Lovenox throughout the pregnancy like before.  For now, lets plan to see back here every 1-2 years to reassess the decision for long-term blood thinners and if there are alternative options available to you. I'm happy to see her sooner if new questions or problems.  Current Status:  Pt is seen today for follow-up to go over labs.  She remains on coumadin.  She reports some SOB.  She is scheduled to be seen at coumadin clinic in 08/2017.    Problem List Patient Active Problem List   Diagnosis Date Noted  . Abdominal cramps [R10.9] 08/30/2015  . Morbid obesity ([place]) [E66.01] 08/18/2014  . Fibroid, uterine [D25.9] 08/18/2014  . Benign essential hypertension [I10] 07/21/2014  . Iron deficiency anemia due to chronic blood loss [D50.0] 02/05/2014  . Back pain with left-sided radiculopathy [M54.10] 10/31/2013  . Secondary pulmonary hypertension [IMO0002] 07/06/2013  . Obstructive sleep apnea [G47.33] 07/06/2013  . Pleuritic chest pain [R07.81] 04/27/2013  . SOB (shortness of breath) [R06.02] 04/27/2013  . Hyperglycemia [R73.9] 04/27/2013  . Anemia [D64.9] 04/27/2013  . Tobacco use disorder [F17.200] 04/27/2013  . Saddle embolus of pulmonary artery with acute cor pulmonale (HCC) [I26.02] 04/27/2013    Past Medical History Past Medical History:  Diagnosis Date  . Abdominal cramps 08/30/2015  . Anemia   . Blood  transfusion without reported diagnosis   . Closed left ankle fracture    August 30 2012  . Clotting disorder ([place])   . DVT (deep venous thrombosis) (HCC)    L leg  . Fibroid tumor   . Hidradenitis   . High blood pressure   . Hypertension   . Obesity   . OSA on CPAP   . Oxygen deficiency   . Pregnancy    09/20/14 17 weeks  . Pulmonary embolism ([place]) 04/2013  . Sleep apnea   . Tobacco use disorder     Past Surgical History Past Surgical History:  Procedure Laterality Date  . ABSCESS DRAINAGE Bilateral 01/10/15  . CYST REMOVAL TRUNK    . IVC FILTER PLACEMENT ([facility] HX)      Family History Family History  Problem Relation Age of Onset  . Hypertension Mother   . Diabetes Paternal Aunt   . Diabetes Paternal Uncle   . Other Father        blood clots  . Cancer Maternal Grandmother   . Heart attack Paternal Grandmother      Social History  reports that she has been smoking cigarettes.  She has a 4.50 pack-year smoking history. She has never used smokeless tobacco. She reports that she drinks about 1.2 oz of alcohol per week. She reports that she does not use drugs.  Medications  Current Outpatient Medications:  .  amLODipine (NORVASC) 10 MG tablet, Take 1 tablet (10 mg total) by mouth daily., Disp: 90 tablet, Rfl: 3 .  lisinopril (PRINIVIL,ZESTRIL) 40 MG tablet, Take 1 tablet (40 mg total) by mouth daily., Disp: 90 tablet, Rfl: 3 .  norethindrone (MICRONOR,CAMILA,ERRIN) 0.35 MG tablet, TAKE ONE TABLET BY MOUTH ONCE DAILY, Disp: 28 tablet, Rfl: 3 .  Oxycodone HCl 10 MG TABS, Take 1 tablet (10 mg total) by mouth every 8 (eight) hours as needed., Disp: 60 tablet, Rfl: 0 .  triamterene-hydrochlorothiazide (MAXZIDE-25) 37.5-25 MG tablet, TAKE 1 TABLET BY MOUTH ONCE A DAY., Disp: 30 tablet, Rfl: 3 .  warfarin (COUMADIN) 1 MG tablet, Take 1 tablet (1 mg total) by mouth daily., Disp: 30 tablet, Rfl: 3 .  warfarin (COUMADIN) 10 MG tablet, Take 1 tablet (10 mg total) by mouth daily.,  Disp: 30 tablet, Rfl: 3 .  warfarin (COUMADIN) 2.5 MG tablet, Take 1 tablet (2.5 mg total) by mouth daily., Disp: 30 tablet, Rfl: 3 No current facility-administered medications for this visit.   Facility-Administered Medications Ordered in Other Visits:  .  0.9 %  sodium chloride infusion, , Intravenous, Continuous, Kefalas, Manon Hilding, PA-C, Last Rate: 20 mL/hr at 12/22/15 1430  Allergies Sulfa antibiotics  Review of Systems Review of Systems - Oncology ROS negative other than SOB with exertion.     Physical Exam  Vitals Wt Readings from Last 3 Encounters:  07/31/17 (!) 371 lb 6.4 oz (168.5 kg)  06/12/17 (!) 362 lb 0.6 oz (164.2 kg)  04/22/17 (!) 363 lb (164.7 kg)   Temp Readings from Last 3 Encounters:  07/31/17 98.2 F (36.8 C) (Oral)  06/12/17 98.6 F (37 C) (Temporal)  04/22/17 98 F (36.7 C) (Oral)   BP Readings from Last 3 Encounters:  07/31/17 136/74  06/12/17 126/64  04/22/17 130/75   Pulse Readings from Last 3 Encounters:  07/31/17 76  06/12/17 89  04/22/17 71   Constitutional: Well-developed, well-nourished, and in no distress.   HENT: Head: Normocephalic and atraumatic.  Mouth/Throat: No oropharyngeal exudate. Mucosa moist. Eyes: Pupils are equal, round, and reactive to light. Conjunctivae are normal. No scleral icterus.  Neck: Normal range of motion. Neck supple. No JVD present.  Cardiovascular: Normal rate, regular rhythm and normal heart sounds.  Exam reveals no gallop and no friction rub.   No murmur heard. Pulmonary/Chest: Effort normal and breath sounds normal. No respiratory distress. No wheezes.No rales.  Abdominal: Soft. Bowel sounds are normal. No distension. There is no tenderness. There is no guarding.  Musculoskeletal: No edema or tenderness.  Lymphadenopathy: No cervical, axillary or supraclavicular adenopathy.  Neurological: Alert and oriented to person, place, and time. No cranial nerve deficit.  Skin: Skin is warm and dry. No rash noted.  No erythema. No pallor.  Psychiatric: Affect and judgment normal.   Labs Office Visit on 07/31/2017  Component Date Value Ref Range Status  . Prothrombin Time 07/31/2017 31.1* 11.4 - 15.2 seconds Final  . INR 07/31/2017 3.03   Final   Performed at [facility], [address], [place], [place] [address]  Appointment on 07/31/2017  Component Date Value Ref Range Status  . WBC 07/31/2017 6.7  4.0 - 10.5 K/uL Final  .  RBC 07/31/2017 3.52* 3.87 - 5.11 MIL/uL Final  . Hemoglobin 07/31/2017 10.7* 12.0 - 15.0 g/dL Final  . HCT 07/31/2017 33.4* 36.0 - 46.0 % Final  . MCV 07/31/2017 94.9  78.0 - 100.0 fL Final  . MCH 07/31/2017 30.4  26.0 - 34.0 pg Final  . MCHC 07/31/2017 32.0  30.0 - 36.0 g/dL Final  . RDW 07/31/2017 16.2* 11.5 - 15.5 % Final  . Platelets 07/31/2017 269  150 - 400 K/uL Final  . Neutrophils Relative % 07/31/2017 64  % Final  . Neutro Abs 07/31/2017 4.3  1.7 - 7.7 K/uL Final  . Lymphocytes Relative 07/31/2017 24  % Final  . Lymphs Abs 07/31/2017 1.6  0.7 - 4.0 K/uL Final  . Monocytes Relative 07/31/2017 7  % Final  . Monocytes Absolute 07/31/2017 0.4  0.1 - 1.0 K/uL Final  . Eosinophils Relative 07/31/2017 4  % Final  . Eosinophils Absolute 07/31/2017 0.3  0.0 - 0.7 K/uL Final  . Basophils Relative 07/31/2017 1  % Final  . Basophils Absolute 07/31/2017 0.0  0.0 - 0.1 K/uL Final   Performed at [facility], [address] [place], [place] [address]  . Sodium 07/31/2017 142  135 - 145 mmol/L Final  . Potassium 07/31/2017 4.3  3.5 - 5.1 mmol/L Final  . Chloride 07/31/2017 112* 98 - 111 mmol/L Final   Please note change in reference range.  . CO2 07/31/2017 23  22 - 32 mmol/L Final  . Glucose, Bld 07/31/2017 93  70 - 99 mg/dL Final   Please note change in reference range.  . BUN 07/31/2017 13  6 - 20 mg/dL Final   Please note change in reference range.  . Creatinine, Ser 07/31/2017 0.82  0.44 - 1.00 mg/dL Final  . Calcium 07/31/2017 8.7* 8.9 - 10.3 mg/dL Final  . Total  Protein 07/31/2017 7.4  6.5 - 8.1 g/dL Final  . Albumin 07/31/2017 3.4* 3.5 - 5.0 g/dL Final  . AST 07/31/2017 15  15 - 41 U/L Final  . ALT 07/31/2017 14  0 - 44 U/L Final   Please note change in reference range.  . Alkaline Phosphatase 07/31/2017 51  38 - 126 U/L Final  . Total Bilirubin 07/31/2017 0.2* 0.3 - 1.2 mg/dL Final  . GFR calc non Af Amer 07/31/2017 >60  >60 mL/min Final  . GFR calc Af Amer 07/31/2017 >60  >60 mL/min Final   Comment: (NOTE) The eGFR has been calculated using the CKD EPI equation. This calculation has not been validated in all clinical situations. eGFR's persistently <60 mL/min signify possible Chronic Kidney Disease.   Georgiann Hahn gap 07/31/2017 7  5 - 15 Final   Performed at [facility], [address] [place], [place] [address]  . LDH 07/31/2017 111  98 - 192 U/L Final   Performed at [facility], [address], [place], [place] [address]     Pathology Orders Placed This Encounter  Procedures  . CBC with Differential/Platelet    Standing Status:   Future    Standing Expiration Date:   08/01/2018  . Comprehensive metabolic panel    Standing Status:   Future    Standing Expiration Date:   08/01/2018  . Lactate dehydrogenase    Standing Status:   Future    Standing Expiration Date:   08/01/2018  . Ferritin    Standing Status:   Future    Standing Expiration Date:   08/01/2018       Mathis Dad Candee Hoon  MD 

## 2017-08-07 ENCOUNTER — Ambulatory Visit: Payer: Medicaid Other | Admitting: Student in an Organized Health Care Education/Training Program

## 2017-08-12 ENCOUNTER — Other Ambulatory Visit: Payer: Self-pay | Admitting: Family Medicine

## 2017-08-12 DIAGNOSIS — M5410 Radiculopathy, site unspecified: Secondary | ICD-10-CM

## 2017-08-14 ENCOUNTER — Other Ambulatory Visit: Payer: Self-pay | Admitting: Family Medicine

## 2017-08-14 DIAGNOSIS — M5410 Radiculopathy, site unspecified: Secondary | ICD-10-CM

## 2017-08-15 MED ORDER — OXYCODONE HCL 10 MG PO TABS: 10.0000 mg | ORAL_TABLET | Freq: Three times a day (TID) | ORAL | 0 refills | Status: DC | PRN

## 2017-08-18 ENCOUNTER — Other Ambulatory Visit: Payer: Self-pay | Admitting: Family Medicine

## 2017-08-18 ENCOUNTER — Telehealth: Payer: Self-pay

## 2017-08-18 MED ORDER — OXYCODONE HCL 10 MG PO TABS: ORAL_TABLET | ORAL | 0 refills | Status: DC

## 2017-08-18 NOTE — Telephone Encounter (Signed)
Dr Mannie Stabile tried to send in her oxycodone electronically Friday but it printed instead and she left before it was signed. Will you refill so patient can come collect or refill electronically? Please advise

## 2017-08-18 NOTE — Progress Notes (Signed)
Oxycodone 10

## 2017-08-18 NOTE — Telephone Encounter (Signed)
CORRECTED message, I see that Dr Lynelle Doctor has been prescribing pain medication every month for the patient, I have prescribed 1 week supply please let the pateint know and Dr  Lynelle Doctor to address on her return

## 2017-08-18 NOTE — Telephone Encounter (Signed)
Last seen in May, no pain contract on file I will prescribe short term supply

## 2017-08-19 NOTE — Telephone Encounter (Signed)
fyi- the medication printed Friday (think you meant to send it electronically) so Dr Moshe Cipro sent in 1 week for her to last until you return.

## 2017-08-19 NOTE — Telephone Encounter (Signed)
Patient aware.

## 2017-08-25 ENCOUNTER — Telehealth: Payer: Self-pay | Admitting: Family Medicine

## 2017-08-25 NOTE — Telephone Encounter (Signed)
Pt is calling to advise that the Short Term supply of medicine runs out today, she needs a script called into [facility] --Will be using the last 2 pills today, and needs a refill called in for today or tomorrow.

## 2017-08-26 ENCOUNTER — Other Ambulatory Visit: Payer: Self-pay | Admitting: Family Medicine

## 2017-08-26 DIAGNOSIS — M5410 Radiculopathy, site unspecified: Secondary | ICD-10-CM

## 2017-08-26 MED ORDER — OXYCODONE HCL 10 MG PO TABS: 10.0000 mg | ORAL_TABLET | Freq: Three times a day (TID) | ORAL | 0 refills | Status: DC | PRN

## 2017-08-26 NOTE — Telephone Encounter (Signed)
Spoke with patient and she needs the Oxycodone 10mg  sent in. You printed it but didn't sign it before leaving. Dr.Simpson gave short term supply in you absence.

## 2017-08-26 NOTE — Telephone Encounter (Signed)
Please advise that oxycodone was sent in for #30 pill. This is enough until her scheduled pain management appointment.  No further refills. All refills by Pain management.

## 2017-08-27 ENCOUNTER — Encounter: Payer: Self-pay | Admitting: Cardiology

## 2017-08-27 ENCOUNTER — Ambulatory Visit (INDEPENDENT_AMBULATORY_CARE_PROVIDER_SITE_OTHER): Payer: Medicaid Other | Admitting: Cardiology

## 2017-08-27 DIAGNOSIS — I10 Essential (primary) hypertension: Secondary | ICD-10-CM

## 2017-08-27 DIAGNOSIS — I272 Pulmonary hypertension, unspecified: Secondary | ICD-10-CM | POA: Diagnosis not present

## 2017-08-27 DIAGNOSIS — Z86711 Personal history of pulmonary embolism: Secondary | ICD-10-CM | POA: Diagnosis not present

## 2017-08-27 NOTE — Patient Instructions (Addendum)
Medication Instructions:   Your physician recommends that you continue on your current medications as directed. Please refer to the Current Medication list given to you today.  Labwork:  NONE  Testing/Procedures: Your physician has requested that you have an echocardiogram. Echocardiography is a painless test that uses sound waves to create images of your heart. It provides your doctor with information about the size and shape of your heart and how well your heart's chambers and valves are working. This procedure takes approximately one hour. There are no restrictions for this procedure.  Follow-Up:  Your physician recommends that you schedule a follow-up appointment in: 1 year. You will receive a reminder letter in the mail in about 10 months reminding you to call and schedule your appointment. If you don't receive this letter, please contact our office.  Any Other Special Instructions Will Be Listed Below (If Applicable).  You have been referred to our coumadin clinic with Edrick Oh RN.  If you need a refill on your cardiac medications before your next appointment, please call your pharmacy.

## 2017-08-27 NOTE — Telephone Encounter (Signed)
Spoke with patient and let her know that her pain medication was refilled for #30. This will get her to her appt with pain management. After this all refills must come from pain management. She verbalized understanding.

## 2017-08-27 NOTE — Progress Notes (Signed)
Clinical Summary [name] [name] is a 42 y.o.female seen as new consult, referred by Dr Mannie Stabile for history of pulmonary embolism  1. History of pulmonary embolism/Pulmonary HTN - history of unprovoked saddle PE in 04/2013. Followed by hematology, plans for lifelong anticoagulation. Has been on coumadin. From [facility] hematology notes did not recommend a DOAC, stating have not been well studied in patients over 250 lbs.  - 04/2013 LVEF 50-93%, grade I diastolic dysfunction. Flattening of interventricular septum with mild RV dysfunction, PASP 53.  - I don't see where she had a f/u echo though repeat CT did show near complete lysis of prior extensive PE.   - INR currently followed by cancer  - no recent bleeding on coumadin.   2. HTN - compliant with meds.     Past Medical History:  Diagnosis Date  . Abdominal cramps 08/30/2015  . Anemia   . Blood transfusion without reported diagnosis   . Closed left ankle fracture    August 30 2012  . Clotting disorder ([place])   . DVT (deep venous thrombosis) (HCC)    L leg  . Fibroid tumor   . Hidradenitis   . High blood pressure   . Hypertension   . Obesity   . OSA on CPAP   . Oxygen deficiency   . Pregnancy    09/20/14 17 weeks  . Pulmonary embolism ([place]) 04/2013  . Sleep apnea   . Tobacco use disorder      Allergies  Allergen Reactions  . Sulfa Antibiotics Itching, Swelling and Rash    Lips swelling     Current Outpatient Medications  Medication Sig Dispense Refill  . amLODipine (NORVASC) 10 MG tablet Take 1 tablet (10 mg total) by mouth daily. 90 tablet 3  . lisinopril (PRINIVIL,ZESTRIL) 40 MG tablet Take 1 tablet (40 mg total) by mouth daily. 90 tablet 3  . norethindrone (MICRONOR,CAMILA,ERRIN) 0.35 MG tablet TAKE ONE TABLET BY MOUTH ONCE DAILY 28 tablet 3  . Oxycodone HCl 10 MG TABS Take one tablet every 8 hours as needed for uncontrolled pain 15 tablet 0  . Oxycodone HCl 10 MG TABS Take 1 tablet (10 mg total) by mouth every 8 (eight)  hours as needed. 30 tablet 0  . triamterene-hydrochlorothiazide (MAXZIDE-25) 37.5-25 MG tablet TAKE 1 TABLET BY MOUTH ONCE A DAY. 30 tablet 3  . warfarin (COUMADIN) 1 MG tablet Take 1 tablet (1 mg total) by mouth daily. 30 tablet 3  . warfarin (COUMADIN) 10 MG tablet Take 1 tablet (10 mg total) by mouth daily. 30 tablet 3  . warfarin (COUMADIN) 2.5 MG tablet Take 1 tablet (2.5 mg total) by mouth daily. 30 tablet 3   No current facility-administered medications for this visit.    Facility-Administered Medications Ordered in Other Visits  Medication Dose Route Frequency Provider Last Rate Last Dose  . 0.9 %  sodium chloride infusion   Intravenous Continuous Baird Cancer, PA-C 20 mL/hr at 12/22/15 1430       Past Surgical History:  Procedure Laterality Date  . ABSCESS DRAINAGE Bilateral 01/10/15  . CYST REMOVAL TRUNK    . IVC FILTER PLACEMENT ([facility] HX)       Allergies  Allergen Reactions  . Sulfa Antibiotics Itching, Swelling and Rash    Lips swelling      Family History  Problem Relation Age of Onset  . Hypertension Mother   . Diabetes Paternal Aunt   . Diabetes Paternal Uncle   . Other Father  blood clots  . Cancer Maternal Grandmother   . Heart attack Paternal Grandmother      Social History [name] [name] reports that she has been smoking cigarettes.  She has a 4.50 pack-year smoking history. She has never used smokeless tobacco. [name] [name] reports that she drinks about 1.2 oz of alcohol per week.   Review of Systems CONSTITUTIONAL: No weight loss, fever, chills, weakness or fatigue.  HEENT: Eyes: No visual loss, blurred vision, double vision or yellow sclerae.No hearing loss, sneezing, congestion, runny nose or sore throat.  SKIN: No rash or itching.  CARDIOVASCULAR: per hpi RESPIRATORY: No shortness of breath, cough or sputum.  GASTROINTESTINAL: No anorexia, nausea, vomiting or diarrhea. No abdominal pain or blood.  GENITOURINARY: No burning on urination,  no polyuria NEUROLOGICAL: No headache, dizziness, syncope, paralysis, ataxia, numbness or tingling in the extremities. No change in bowel or bladder control.  MUSCULOSKELETAL: No muscle, back pain, joint pain or stiffness.  LYMPHATICS: No enlarged nodes. No history of splenectomy.  PSYCHIATRIC: No history of depression or anxiety.  ENDOCRINOLOGIC: No reports of sweating, cold or heat intolerance. No polyuria or polydipsia.  Marland Kitchen   Physical Examination Vitals:   08/27/17 0926 08/27/17 0932  BP: 122/76 118/74  Pulse: 79   SpO2: 98%    Vitals:   08/27/17 0926  Weight: (!) 364 lb (165.1 kg)  Height: 5\' 10"  (1.778 m)    Gen: resting comfortably, no acute distress HEENT: no scleral icterus, pupils equal round and reactive, no palptable cervical adenopathy,  CV: RRR, no m/r,g no jvd Resp: Clear to auscultation bilaterally GI: abdomen is soft, non-tender, non-distended, normal bowel sounds, no hepatosplenomegaly MSK: extremities are warm, no edema.  Skin: warm, no rash Neuro:  no focal deficits Psych: appropriate affect   Diagnostic Studies 04/2013 echo Study Conclusions  - Study data: Technically difficult study. - Procedure narrative: Transthoracic echocardiography. Image quality was adequate. The study was technically difficult, as a result of body habitus. - Left ventricle: The cavity size was normal. Wall thickness was increased in a pattern of moderate LVH. Systolic function was vigorous. The estimated ejection fraction was in the range of 65% to 70%. Wall motion was normal; there were no regional wall motion abnormalities. Doppler parameters are consistent with abnormal left ventricular relaxation (grade 1 diastolic dysfunction). - Aortic valve: Valve area: 2.01cm^2(VTI). Valve area: 2.11cm^2 (Vmax). - Right ventricle: There is systolic flattening of the interventricular septum consistent with RV pressure overload. The cavity size was severely  dilated. Systolic function was mildly reduced. - Pulmonary arteries: Systolic pressure was moderately increased. PA peak pressure: 105mm Hg (S).    Assessment and Plan  1. History of pulmonary embolism/Pulmonary HTN - committed to lifelong anticoag, hematology has recommended coumadin over DOACs for her - we will enroll her in our coumadin clinic - has not had repeat echo since her PE. Repeat to reevaluate RV function and see if any ongoing elevated PA pressures that may require monitoring.    2. HTN - at goal, continue current meds  F/u 1 year   Arnoldo Lenis, M.D.

## 2017-09-03 ENCOUNTER — Ambulatory Visit (INDEPENDENT_AMBULATORY_CARE_PROVIDER_SITE_OTHER): Payer: Medicaid Other | Admitting: *Deleted

## 2017-09-03 ENCOUNTER — Ambulatory Visit (HOSPITAL_COMMUNITY)
Admission: RE | Admit: 2017-09-03 | Discharge: 2017-09-03 | Disposition: A | Discharge status: Home / Self Care | Payer: Medicaid Other | Source: Ambulatory Visit | Attending: Cardiology | Admitting: Cardiology

## 2017-09-03 DIAGNOSIS — Z86711 Personal history of pulmonary embolism: Secondary | ICD-10-CM | POA: Diagnosis not present

## 2017-09-03 DIAGNOSIS — Z5181 Encounter for therapeutic drug level monitoring: Secondary | ICD-10-CM | POA: Diagnosis not present

## 2017-09-03 DIAGNOSIS — I2602 Saddle embolus of pulmonary artery with acute cor pulmonale: Secondary | ICD-10-CM | POA: Diagnosis not present

## 2017-09-03 DIAGNOSIS — Z7901 Long term (current) use of anticoagulants: Secondary | ICD-10-CM | POA: Insufficient documentation

## 2017-09-03 DIAGNOSIS — I119 Hypertensive heart disease without heart failure: Secondary | ICD-10-CM | POA: Insufficient documentation

## 2017-09-03 DIAGNOSIS — Z6841 Body Mass Index (BMI) 40.0 and over, adult: Secondary | ICD-10-CM | POA: Insufficient documentation

## 2017-09-03 DIAGNOSIS — Z720 Tobacco use: Secondary | ICD-10-CM | POA: Insufficient documentation

## 2017-09-03 DIAGNOSIS — I272 Pulmonary hypertension, unspecified: Secondary | ICD-10-CM | POA: Diagnosis present

## 2017-09-03 DIAGNOSIS — G4733 Obstructive sleep apnea (adult) (pediatric): Secondary | ICD-10-CM | POA: Diagnosis not present

## 2017-09-03 LAB — POCT INR: INR: 1.3 — AB (ref 2.0–3.0)

## 2017-09-03 NOTE — Patient Instructions (Signed)
Take coumadin 12.5mg  on Sundays, Tuesdays, Thursdays and Saturdays and 15mg  on Mondays, Wednesdays and Fridays Recheck in 10 days

## 2017-09-03 NOTE — Progress Notes (Signed)
*  PRELIMINARY RESULTS* Echocardiogram 2D Echocardiogram has been performed.  [name] [name] 09/03/2017, 10:16 AM

## 2017-09-04 ENCOUNTER — Ambulatory Visit
Payer: Medicaid Other | Attending: Student in an Organized Health Care Education/Training Program | Admitting: Student in an Organized Health Care Education/Training Program

## 2017-09-04 ENCOUNTER — Encounter: Payer: Self-pay | Admitting: Student in an Organized Health Care Education/Training Program

## 2017-09-04 ENCOUNTER — Other Ambulatory Visit: Payer: Self-pay

## 2017-09-04 VITALS — BP 142/76 | HR 155 | Temp 98.2°F | Resp 18 | Ht 70.0 in | Wt 361.0 lb

## 2017-09-04 DIAGNOSIS — I2602 Saddle embolus of pulmonary artery with acute cor pulmonale: Secondary | ICD-10-CM

## 2017-09-04 DIAGNOSIS — Z79891 Long term (current) use of opiate analgesic: Secondary | ICD-10-CM | POA: Insufficient documentation

## 2017-09-04 DIAGNOSIS — M9983 Other biomechanical lesions of lumbar region: Secondary | ICD-10-CM

## 2017-09-04 DIAGNOSIS — M545 Low back pain: Secondary | ICD-10-CM | POA: Insufficient documentation

## 2017-09-04 DIAGNOSIS — G8929 Other chronic pain: Secondary | ICD-10-CM | POA: Insufficient documentation

## 2017-09-04 DIAGNOSIS — M47816 Spondylosis without myelopathy or radiculopathy, lumbar region: Secondary | ICD-10-CM

## 2017-09-04 DIAGNOSIS — Z7901 Long term (current) use of anticoagulants: Secondary | ICD-10-CM | POA: Diagnosis not present

## 2017-09-04 DIAGNOSIS — I10 Essential (primary) hypertension: Secondary | ICD-10-CM | POA: Insufficient documentation

## 2017-09-04 DIAGNOSIS — G4733 Obstructive sleep apnea (adult) (pediatric): Secondary | ICD-10-CM | POA: Diagnosis not present

## 2017-09-04 DIAGNOSIS — G894 Chronic pain syndrome: Secondary | ICD-10-CM

## 2017-09-04 DIAGNOSIS — Z86711 Personal history of pulmonary embolism: Secondary | ICD-10-CM | POA: Diagnosis not present

## 2017-09-04 DIAGNOSIS — Z79899 Other long term (current) drug therapy: Secondary | ICD-10-CM | POA: Diagnosis not present

## 2017-09-04 DIAGNOSIS — M48061 Spinal stenosis, lumbar region without neurogenic claudication: Secondary | ICD-10-CM

## 2017-09-04 DIAGNOSIS — I272 Pulmonary hypertension, unspecified: Secondary | ICD-10-CM | POA: Diagnosis not present

## 2017-09-04 DIAGNOSIS — M5136 Other intervertebral disc degeneration, lumbar region: Secondary | ICD-10-CM | POA: Diagnosis not present

## 2017-09-04 DIAGNOSIS — D509 Iron deficiency anemia, unspecified: Secondary | ICD-10-CM | POA: Insufficient documentation

## 2017-09-04 DIAGNOSIS — Z6841 Body Mass Index (BMI) 40.0 and over, adult: Secondary | ICD-10-CM | POA: Insufficient documentation

## 2017-09-04 MED ORDER — DICLOFENAC SODIUM 75 MG PO TBEC: 75.0000 mg | DELAYED_RELEASE_TABLET | Freq: Two times a day (BID) | ORAL | 0 refills | Status: AC

## 2017-09-04 MED ORDER — PREGABALIN 50 MG PO CAPS: ORAL_CAPSULE | ORAL | 0 refills | Status: DC

## 2017-09-04 NOTE — Progress Notes (Deleted)
Patient's Name: [name] [name]  MRN: [id number]  Referring Provider: Caren Macadam, MD  DOB: 05/12/1975  PCP: [name] Macadam, MD  DOS: 09/04/2017  Note by: [name] Santa, MD  Service setting: Ambulatory outpatient  Specialty: Interventional Pain Management  Location: [facility] (AMB) Pain Management Facility  Visit type: Initial Patient Evaluation  Patient type: New Patient   Primary Reason(s) for Visit: Encounter for initial evaluation of one or more chronic problems (new to examiner) potentially causing chronic pain, and posing a threat to normal musculoskeletal function. (Level of risk: High) CC: Back Pain (lower)  HPI  [name] [name] is a 42 y.o. year old, female patient, who comes today to see [place] for the first time for an initial evaluation of her chronic pain. She has Pleuritic chest pain; SOB (shortness of breath); Hyperglycemia; Anemia; Tobacco use disorder; Saddle embolus of pulmonary artery with acute cor pulmonale ([place]); Secondary pulmonary hypertension; Obstructive sleep apnea; Back pain with left-sided radiculopathy; Iron deficiency anemia due to chronic blood loss; Benign essential hypertension; Morbid obesity ([place]); Fibroid, uterine; Abdominal cramps; History of pulmonary embolism; and Encounter for therapeutic drug monitoring on their problem list. Today she comes in for evaluation of her Back Pain (lower)  Pain Assessment: Location: Lower Back Radiating: down sides of both legs to ankles Onset: More than a month ago Duration: Chronic pain Quality: Constant, Throbbing, Sharp, Shooting Severity: 10-Worst pain ever/10 (subjective, self-reported pain score)  Note: Reported level is compatible with observation.                         When using our objective Pain Scale, levels between 6 and 10/10 are said to belong in an emergency room, as it progressively worsens from a 6/10, described as severely limiting, requiring emergency care not usually available at an outpatient pain management facility.  At a 6/10 level, communication becomes difficult and requires great effort. Assistance to reach the emergency department may be required. Facial flushing and profuse sweating along with potentially dangerous increases in heart rate and blood pressure will be evident. Effect on ADL: sometimes pt stays in bed for up to 2 days at a time because of pain; hard to care for 42 year old Timing: Constant Modifying factors: hot compresses, cold packs, meds BP: (!) 142/76  HR: (!) 155  Onset and Duration: {Hx; Onset and Duration:210120511} Cause of pain: {Hx; Cause:210120521} Severity: {Pain Severity:210120502} Timing: {Symptoms; Timing:210120501} Aggravating Factors: {Causes; Aggravating pain factors:210120507} Alleviating Factors: {Causes; Alleviating Factors:210120500} Associated Problems: {Hx; Associated problems:210120515} Quality of Pain: {Hx; Symptom quality or Descriptor:210120531} Previous Examinations or Tests: {Hx; Previous examinations or test:210120529} Previous Treatments: {Hx; Previous Treatment:210120503}  The patient comes into the clinics today for the first time for a chronic pain management evaluation. ***  Today I took the time to provide the patient with information regarding my pain practice. The patient was informed that my practice is divided into two sections: an interventional pain management section, as well as a completely separate and distinct medication management section. I explained that I have procedure days for my interventional therapies, and evaluation days for follow-ups and medication management. Because of the amount of documentation required during both, they are kept separated. This means that there is the possibility that she may be scheduled for a procedure on one day, and medication management the next. I have also informed her that because of staffing and facility limitations, I no longer take patients for medication management only. To illustrate the reasons for  this, I gave the patient the example of surgeons, and how inappropriate it would be to refer a patient to his/her care, just to write for the post-surgical antibiotics on a surgery done by a different surgeon.   Because interventional pain management is my board-certified specialty, the patient was informed that joining my practice means that they are open to any and all interventional therapies. I made it clear that this does not mean that they will be forced to have any procedures done. What this means is that I believe interventional therapies to be essential part of the diagnosis and proper management of chronic pain conditions. Therefore, patients not interested in these interventional alternatives will be better served under the care of a different practitioner.  The patient was also made aware of my Comprehensive Pain Management Safety Guidelines where by joining my practice, they limit all of their nerve blocks and joint injections to those done by our practice, for as long as we are retained to manage their care.   Historic Controlled Substance Pharmacotherapy Review  PMP and historical list of controlled substances: ***  Highest opioid analgesic regimen found: ***  Most recent opioid analgesic: ***  Current opioid analgesics: ***  Highest recorded MME/day: *** mg/day MME/day: *** mg/day Medications: The patient did not bring the medication(s) to the appointment, as requested in our "New Patient Package" Pharmacodynamics: Desired effects: Analgesia: The patient reports >50% benefit. Reported improvement in function: The patient reports medication allows her to accomplish basic ADLs. Clinically meaningful improvement in function (CMIF): Sustained CMIF goals met Perceived effectiveness: Described as relatively effective, allowing for increase in activities of daily living (ADL) Undesirable effects: Side-effects or Adverse reactions: None reported Historical Monitoring: The patient   reports that she does not use drugs. List of all UDS Test(s): Lab Results  Component Value Date   COCAINSCRNUR Negative 07/21/2014   COCAINSCRNUR NONE DETECTED 04/27/2013   THCU NONE DETECTED 04/27/2013   ETH 94 (H) 04/27/2013   List of other Serum/Urine Drug Screening Test(s):  Lab Results  Component Value Date   COCAINSCRNUR Negative 07/21/2014   COCAINSCRNUR NONE DETECTED 04/27/2013   THCU NONE DETECTED 04/27/2013   ETH 94 (H) 04/27/2013   Historical Background Evaluation: [place] PMP: Six (6) year initial data search conducted.             PMP NARX Score Report:  Narcotic: *** Sedative: *** Stimulant: *** [place] Department of public safety, offender search: Editor, commissioning Information) Non-contributory Risk Assessment Profile: Aberrant behavior: None observed or detected today Risk factors for fatal opioid overdose: None identified today PMP NARX Overdose Risk Score: *** Fatal overdose hazard ratio (HR): Calculation deferred Non-fatal overdose hazard ratio (HR): Calculation deferred Risk of opioid abuse or dependence: 0.7-3.0% with doses ? 36 MME/day and 6.1-26% with doses ? 120 MME/day. Substance use disorder (SUD) risk level: See below Personal History of Substance Abuse (SUD-Substance use disorder):  Alcohol: Negative  Illegal Drugs: Negative  Rx Drugs: Negative  ORT Risk Level calculation: Low Risk Opioid Risk Tool - 09/04/17 1128      Family History of Substance Abuse   Alcohol  Negative    Illegal Drugs  Negative    Rx Drugs  Negative      Personal History of Substance Abuse   Alcohol  Negative    Illegal Drugs  Negative    Rx Drugs  Negative      Age   Age between 49-45 years   Yes      History  of Preadolescent Sexual Abuse   History of Preadolescent Sexual Abuse  Negative or Female      Psychological Disease   Psychological Disease  Negative    Depression  Negative      Total Score   Opioid Risk Tool Scoring  1    Opioid Risk Interpretation  Low Risk      ORT  Scoring interpretation table:  Score <3 = Low Risk for SUD  Score between 4-7 = Moderate Risk for SUD  Score >8 = High Risk for Opioid Abuse   PHQ-2 Depression Scale:  Total score: 0  PHQ-2 Scoring interpretation table: (Score and probability of major depressive disorder)  Score 0 = No depression  Score 1 = 15.4% Probability  Score 2 = 21.1% Probability  Score 3 = 38.4% Probability  Score 4 = 45.5% Probability  Score 5 = 56.4% Probability  Score 6 = 78.6% Probability   PHQ-9 Depression Scale:  Total score: 0  PHQ-9 Scoring interpretation table:  Score 0-4 = No depression  Score 5-9 = Mild depression  Score 10-14 = Moderate depression  Score 15-19 = Moderately severe depression  Score 20-27 = Severe depression (2.4 times higher risk of SUD and 2.89 times higher risk of overuse)   Pharmacologic Plan: As per protocol, I have not taken over any controlled substance management, pending the results of ordered tests and/or consults.            Initial impression: Pending review of available data and ordered tests.  Meds   Current Outpatient Medications:  .  amLODipine (NORVASC) 10 MG tablet, Take 1 tablet (10 mg total) by mouth daily., Disp: 90 tablet, Rfl: 3 .  lisinopril (PRINIVIL,ZESTRIL) 40 MG tablet, Take 1 tablet (40 mg total) by mouth daily., Disp: 90 tablet, Rfl: 3 .  norethindrone (MICRONOR,CAMILA,ERRIN) 0.35 MG tablet, TAKE ONE TABLET BY MOUTH ONCE DAILY, Disp: 28 tablet, Rfl: 3 .  Oxycodone HCl 10 MG TABS, Take one tablet every 8 hours as needed for uncontrolled pain, Disp: 15 tablet, Rfl: 0 .  triamterene-hydrochlorothiazide (MAXZIDE-25) 37.5-25 MG tablet, TAKE 1 TABLET BY MOUTH ONCE A DAY., Disp: 30 tablet, Rfl: 3 .  warfarin (COUMADIN) 1 MG tablet, Take 1 tablet (1 mg total) by mouth daily., Disp: 30 tablet, Rfl: 3 .  warfarin (COUMADIN) 10 MG tablet, Take 1 tablet (10 mg total) by mouth daily., Disp: 30 tablet, Rfl: 3 .  warfarin (COUMADIN) 2.5 MG tablet, Take 1 tablet  (2.5 mg total) by mouth daily., Disp: 30 tablet, Rfl: 3 No current facility-administered medications for this visit.   Facility-Administered Medications Ordered in Other Visits:  .  0.9 %  sodium chloride infusion, , Intravenous, Continuous, Kefalas, Manon Hilding, PA-C, Last Rate: 20 mL/hr at 12/22/15 1430  Imaging Review  Cervical Imaging: Cervical MR wo contrast: No results found for this or any previous visit. Cervical MR wo contrast: No procedure found. Cervical MR w/wo contrast: No results found for this or any previous visit. Cervical MR w contrast: No results found for this or any previous visit. Cervical CT wo contrast: No results found for this or any previous visit. Cervical CT w/wo contrast: No results found for this or any previous visit. Cervical CT w/wo contrast: No results found for this or any previous visit. Cervical CT w contrast: No results found for this or any previous visit. Cervical CT outside: No results found for this or any previous visit. Cervical DG 1 view: No results found for this or  any previous visit. Cervical DG 2-3 views: No results found for this or any previous visit. Cervical DG F/E views: No results found for this or any previous visit. Cervical DG 2-3 clearing views: No results found for this or any previous visit. Cervical DG Bending/F/E views: No results found for this or any previous visit. Cervical DG complete: No results found for this or any previous visit. Cervical DG Myelogram views: No results found for this or any previous visit. Cervical DG Myelogram views: No results found for this or any previous visit. Cervical Discogram views: No results found for this or any previous visit.  Shoulder Imaging: Shoulder-R MR w contrast: No results found for this or any previous visit. Shoulder-L MR w contrast: No results found for this or any previous visit. Shoulder-R MR w/wo contrast: No results found for this or any previous visit. Shoulder-L MR w/wo  contrast: No results found for this or any previous visit. Shoulder-R MR wo contrast: No results found for this or any previous visit. Shoulder-L MR wo contrast: No results found for this or any previous visit. Shoulder-R CT w contrast: No results found for this or any previous visit. Shoulder-L CT w contrast: No results found for this or any previous visit. Shoulder-R CT w/wo contrast: No results found for this or any previous visit. Shoulder-L CT w/wo contrast: No results found for this or any previous visit. Shoulder-R CT wo contrast: No results found for this or any previous visit. Shoulder-L CT wo contrast: No results found for this or any previous visit. Shoulder-R DG Arthrogram: No results found for this or any previous visit. Shoulder-L DG Arthrogram: No results found for this or any previous visit. Shoulder-R DG 1 view: No results found for this or any previous visit. Shoulder-L DG 1 view: No results found for this or any previous visit. Shoulder-R DG: No results found for this or any previous visit. Shoulder-L DG: No results found for this or any previous visit.  Thoracic Imaging: Thoracic MR wo contrast: No results found for this or any previous visit. Thoracic MR wo contrast: No procedure found. Thoracic MR w/wo contrast: No results found for this or any previous visit. Thoracic MR w contrast: No results found for this or any previous visit. Thoracic CT wo contrast: No results found for this or any previous visit. Thoracic CT w/wo contrast: No results found for this or any previous visit. Thoracic CT w/wo contrast: No results found for this or any previous visit. Thoracic CT w contrast: No results found for this or any previous visit. Thoracic DG 2-3 views: No results found for this or any previous visit. Thoracic DG 4 views: No results found for this or any previous visit. Thoracic DG: No results found for this or any previous visit. Thoracic DG w/swimmers view: No results  found for this or any previous visit. Thoracic DG Myelogram views: No results found for this or any previous visit. Thoracic DG Myelogram views: No results found for this or any previous visit.  Lumbosacral Imaging: Lumbar MR wo contrast: No results found for this or any previous visit. Lumbar MR wo contrast: No procedure found. Lumbar MR w/wo contrast: No results found for this or any previous visit. Lumbar MR w/wo contrast: No results found for this or any previous visit. Lumbar MR w contrast: No results found for this or any previous visit. Lumbar CT wo contrast:  Results for orders placed during the hospital encounter of 08/14/15  CT Lumbar Spine Wo Contrast  Narrative CLINICAL DATA:  Chronic low back pain extending at the right lower extremity. Symptoms have progressed since yesterday. The patient is unable to undergo MRI. EXAM: CT LUMBAR SPINE WITHOUT CONTRAST TECHNIQUE: Multidetector CT imaging of the lumbar spine was performed without intravenous contrast administration. Multiplanar CT image reconstructions were also generated. COMPARISON:  None. FINDINGS: Five non rib-bearing lumbar type vertebral bodies are present. Mild focal leftward curvature of the lumbar spine is centered at L4-5. There slight anterolisthesis at L4-5 as well measuring 5 mm. Alignment is otherwise anatomic. Vertebral body heights are intact. An IVC filter is in place. Limited imaging of the abdomen is otherwise unremarkable. Chronic bone changes are associated sacroiliitis bilaterally. L1-2: Mild facet hypertrophy is present bilaterally without significant stenosis. L2-3: Mild facet hypertrophy is present. There is no significant stenosis. L3-4: A broad-based disc protrusion is asymmetric to the left moderate facet hypertrophy and ligamentum flavum thickening is present. Mild central canal stenosis and moderate foraminal narrowing is present bilaterally, worse on the left. L4-5: A broad-based  disc protrusion is present. Advanced facet hypertrophy is noted bilaterally. A vacuum phenomenon is present in the right facet. Severe central canal stenosis is present. Severe right and moderate left foraminal narrowing is evident. L5-S1: A central disc protrusion is present. Mild facet hypertrophy is noted bilaterally. This results an mild central and right foraminal narrowing. IMPRESSION: 1. Advanced facet hypertrophy results and 5 mm grade 1 anterolisthesis at L4-5. 2. There is uncovering of a broad-based disc protrusion at L4-5 with severe central canal stenosis, severe right, and moderate left foraminal narrowing. 3. Mild facet hypertrophy at L1-2 and L2-3 without significant stenosis at these levels. 4. Mild central and moderate bilateral foraminal stenosis at L3-4. 5. Mild central and right foraminal stenosis at L5-S1. Electronically Signed   By: San Morelle M.D.   On: 08/14/2015 17:05   Lumbar CT w/wo contrast: No results found for this or any previous visit. Lumbar CT w/wo contrast: No results found for this or any previous visit. Lumbar CT w contrast:  Results for orders placed during the hospital encounter of 01/20/17  CT LUMBAR SPINE W CONTRAST   Narrative CLINICAL DATA:  Right low back and hip pain occasionally radiating into the right leg.  EXAM: LUMBAR MYELOGRAM  FLUOROSCOPY TIME:  Radiation Exposure Index (as provided by the fluoroscopic device): 5601.00 microGray*m^2  Fluoroscopy Time (in minutes and seconds):  2 minutes 24 seconds  PROCEDURE: Dr. Christella Noa consented the patient and initially attempted a lumbar puncture without return of CSF. I then re-prepped and draped the patient in the usual sterile fashion and provided additional local anesthesia with 1% lidocaine. I performed a lumbar puncture using a 7 inch 22 gauge spinal needle via a right paramedian approach at L2-3. Using a single pass through the dura, the needle was placed within the  thecal sac, with return of clear CSF. 15 mL of Isovue M-200 was injected into the thecal sac, with normal opacification of the nerve roots and cauda equina consistent with free flow within the subarachnoid space.  I personally performed the lumbar puncture and administered the intrathecal contrast. I also personally supervised acquisition of the myelogram images.  TECHNIQUE: Contiguous axial images were obtained through the Lumbar spine after the intrathecal infusion of infusion. Coronal and sagittal reconstructions were obtained of the axial image sets.  COMPARISON:  Noncontrast lumbar spine CT 08/14/2015  FINDINGS: LUMBAR MYELOGRAM FINDINGS:  There are 5 non rib-bearing lumbar type vertebrae. There is grade 1 anterolisthesis of L4  on L5 which increases with standing and flexion. Associated spinal stenosis at this level appears severe with standing. There is a small ventral extradural defect at L3-4 which does not significantly increase with standing and does not result in high-grade stenosis. The thecal sac appears widely patent elsewhere. An IVC filter is noted.  CT LUMBAR MYELOGRAM FINDINGS:  Grade 1 anterolisthesis of L4 on L5 in the supine position measures 4 mm, unchanged from the prior CT. There is also trace retrolisthesis of L3 on L4. Mild depression of the L4 superior endplate on the left is unchanged. No acute fracture or destructive osseous process is identified. The conus medullaris terminates at L1.  Chronic sacroiliitis is noted with associated sclerosis on both sides of the joint. An IVC filter is in place below the level the renal veins. Partially visualized uterine enlargement compatible with a fibroid described on prior ultrasound.  T12-L1: Mild facet arthrosis without disc herniation or stenosis.  L1-2:  Mild facet arthrosis without disc herniation or stenosis.  L2-3: Mild-to-moderate facet arthrosis without disc herniation or stenosis.  L3-4:  Circumferential disc bulging, left foraminal disc osteophyte complex, and mild-to-moderate facet and ligamentum flavum hypertrophy result in borderline spinal stenosis and mild right and moderate left neural foraminal stenosis, similar to the prior CT.  L4-5: Mild disc space narrowing. Anterolisthesis, bulging uncovered disc, right foraminal/ extraforaminal disc protrusion, congenitally short pedicles, and severe facet arthrosis result in moderate spinal stenosis and severe right and mild-to-moderate left neural foraminal stenosis, similar to prior. Potential right L4 nerve impingement in and lateral to the foramen. Widened facet joints containing gas consistent with instability demonstrated on upright radiographs.  L5-S1: Mild disc space narrowing. Mild disc bulging and facet hypertrophy result in borderline to mild right greater than left neural foraminal stenosis without spinal stenosis, unchanged.  IMPRESSION: 1. Severe facet arthrosis at L4-5 with grade 1 anterolisthesis which increases with standing and results in severe spinal stenosis. Severe right neural foraminal stenosis as well. 2. Borderline spinal stenosis and mild right and moderate left foraminal stenosis at L3-4. 3. Mild foraminal narrowing at L5-S1.   Electronically Signed   By: Logan Bores M.D.   On: 01/20/2017 15:39    Lumbar DG 1V: No results found for this or any previous visit. Lumbar DG 1V (Clearing): No results found for this or any previous visit. Lumbar DG 2-3V (Clearing): No results found for this or any previous visit. Lumbar DG 2-3 views: No results found for this or any previous visit. Lumbar DG (Complete) 4+V:  Results for orders placed during the hospital encounter of 10/23/16  DG Lumbar Spine Complete   Narrative CLINICAL DATA:  Low back pain radiating to the left leg  EXAM: LUMBAR SPINE - COMPLETE 4+ VIEW  COMPARISON:  Lumbar spine CT 08/14/2015  FINDINGS: Unchanged grade 1  anterolisthesis at L4-L5 due to facet arthrosis. Vertebral body heights are preserved. Mild narrowing of the intervertebral disc space at L4-5 and L5-S1. Lower lumbar facet hypertrophy at L4-L5 and L5-S1. There is an IVC filter.  IMPRESSION: Unchanged grade 1 anterolisthesis at L4-L5. No acute abnormality of the lumbar spine.   Electronically Signed   By: Ulyses Jarred M.D.   On: 10/23/2016 13:58    Lumbar DG F/E views: No results found for this or any previous visit. Lumbar DG Bending views: No results found for this or any previous visit. Lumbar DG Myelogram views: No results found for this or any previous visit. Lumbar DG Myelogram: No results found for  this or any previous visit. Lumbar DG Myelogram: No results found for this or any previous visit. Lumbar DG Myelogram: No results found for this or any previous visit. Lumbar DG Myelogram Lumbosacral:  Results for orders placed during the hospital encounter of 01/20/17  DG MYELOGRAPHY LUMBAR INJ LUMBOSACRAL   Narrative CLINICAL DATA:  Right low back and hip pain occasionally radiating into the right leg.  EXAM: LUMBAR MYELOGRAM  FLUOROSCOPY TIME:  Radiation Exposure Index (as provided by the fluoroscopic device): 5601.00 microGray*m^2  Fluoroscopy Time (in minutes and seconds):  2 minutes 24 seconds  PROCEDURE: Dr. Christella Noa consented the patient and initially attempted a lumbar puncture without return of CSF. I then re-prepped and draped the patient in the usual sterile fashion and provided additional local anesthesia with 1% lidocaine. I performed a lumbar puncture using a 7 inch 22 gauge spinal needle via a right paramedian approach at L2-3. Using a single pass through the dura, the needle was placed within the thecal sac, with return of clear CSF. 15 mL of Isovue M-200 was injected into the thecal sac, with normal opacification of the nerve roots and cauda equina consistent with free flow within the subarachnoid  space.  I personally performed the lumbar puncture and administered the intrathecal contrast. I also personally supervised acquisition of the myelogram images.  TECHNIQUE: Contiguous axial images were obtained through the Lumbar spine after the intrathecal infusion of infusion. Coronal and sagittal reconstructions were obtained of the axial image sets.  COMPARISON:  Noncontrast lumbar spine CT 08/14/2015  FINDINGS: LUMBAR MYELOGRAM FINDINGS:  There are 5 non rib-bearing lumbar type vertebrae. There is grade 1 anterolisthesis of L4 on L5 which increases with standing and flexion. Associated spinal stenosis at this level appears severe with standing. There is a small ventral extradural defect at L3-4 which does not significantly increase with standing and does not result in high-grade stenosis. The thecal sac appears widely patent elsewhere. An IVC filter is noted.  CT LUMBAR MYELOGRAM FINDINGS:  Grade 1 anterolisthesis of L4 on L5 in the supine position measures 4 mm, unchanged from the prior CT. There is also trace retrolisthesis of L3 on L4. Mild depression of the L4 superior endplate on the left is unchanged. No acute fracture or destructive osseous process is identified. The conus medullaris terminates at L1.  Chronic sacroiliitis is noted with associated sclerosis on both sides of the joint. An IVC filter is in place below the level the renal veins. Partially visualized uterine enlargement compatible with a fibroid described on prior ultrasound.  T12-L1: Mild facet arthrosis without disc herniation or stenosis.  L1-2:  Mild facet arthrosis without disc herniation or stenosis.  L2-3: Mild-to-moderate facet arthrosis without disc herniation or stenosis.  L3-4: Circumferential disc bulging, left foraminal disc osteophyte complex, and mild-to-moderate facet and ligamentum flavum hypertrophy result in borderline spinal stenosis and mild right and moderate left neural  foraminal stenosis, similar to the prior CT.  L4-5: Mild disc space narrowing. Anterolisthesis, bulging uncovered disc, right foraminal/ extraforaminal disc protrusion, congenitally short pedicles, and severe facet arthrosis result in moderate spinal stenosis and severe right and mild-to-moderate left neural foraminal stenosis, similar to prior. Potential right L4 nerve impingement in and lateral to the foramen. Widened facet joints containing gas consistent with instability demonstrated on upright radiographs.  L5-S1: Mild disc space narrowing. Mild disc bulging and facet hypertrophy result in borderline to mild right greater than left neural foraminal stenosis without spinal stenosis, unchanged.  IMPRESSION: 1. Severe facet arthrosis  at L4-5 with grade 1 anterolisthesis which increases with standing and results in severe spinal stenosis. Severe right neural foraminal stenosis as well. 2. Borderline spinal stenosis and mild right and moderate left foraminal stenosis at L3-4. 3. Mild foraminal narrowing at L5-S1.   Electronically Signed   By: Logan Bores M.D.   On: 01/20/2017 15:39    Lumbar DG Diskogram views: No results found for this or any previous visit. Lumbar DG Diskogram views: No results found for this or any previous visit. Lumbar DG Epidurogram OP: No results found for this or any previous visit. Lumbar DG Epidurogram IP: No results found for this or any previous visit.  Sacroiliac Joint Imaging: Sacroiliac Joint DG: No results found for this or any previous visit. Sacroiliac Joint MR w/wo contrast: No results found for this or any previous visit. Sacroiliac Joint MR wo contrast: No results found for this or any previous visit.  Spine Imaging: Whole Spine DG Myelogram views: No results found for this or any previous visit. Whole Spine MR Mets screen: No results found for this or any previous visit. Whole Spine MR Mets screen: No results found for this or any  previous visit. Whole Spine MR w/wo: No results found for this or any previous visit. MRA Spinal Canal w/ cm: No results found for this or any previous visit. MRA Spinal Canal wo/ cm: No procedure found. MRA Spinal Canal w/wo cm: No results found for this or any previous visit. Spine Outside MR Films: No results found for this or any previous visit. Spine Outside CT Films: No results found for this or any previous visit. CT-Guided Biopsy: No results found for this or any previous visit. CT-Guided Needle Placement: No results found for this or any previous visit. DG Spine outside: No results found for this or any previous visit. IR Spine outside: No results found for this or any previous visit. NM Spine outside: No results found for this or any previous visit.  Hip Imaging: Hip-R MR w contrast: No results found for this or any previous visit. Hip-L MR w contrast: No results found for this or any previous visit. Hip-R MR w/wo contrast: No results found for this or any previous visit. Hip-L MR w/wo contrast: No results found for this or any previous visit. Hip-R MR wo contrast: No results found for this or any previous visit. Hip-L MR wo contrast: No results found for this or any previous visit. Hip-R CT w contrast: No results found for this or any previous visit. Hip-L CT w contrast: No results found for this or any previous visit. Hip-R CT w/wo contrast: No results found for this or any previous visit. Hip-L CT w/wo contrast: No results found for this or any previous visit. Hip-R CT wo contrast: No results found for this or any previous visit. Hip-L CT wo contrast: No results found for this or any previous visit. Hip-R DG 2-3 views: No results found for this or any previous visit. Hip-L DG 2-3 views: No results found for this or any previous visit. Hip-R DG Arthrogram: No results found for this or any previous visit. Hip-L DG Arthrogram: No results found for this or any previous  visit. Hip-B DG Bilateral: No results found for this or any previous visit.  Knee Imaging: Knee-R MR w contrast: No results found for this or any previous visit. Knee-L MR w contrast: No results found for this or any previous visit. Knee-R MR w/wo contrast: No results found for this or any  previous visit. Knee-L MR w/wo contrast: No results found for this or any previous visit. Knee-R MR wo contrast: No results found for this or any previous visit. Knee-L MR wo contrast: No results found for this or any previous visit. Knee-R CT w contrast: No results found for this or any previous visit. Knee-L CT w contrast: No results found for this or any previous visit. Knee-R CT w/wo contrast: No results found for this or any previous visit. Knee-L CT w/wo contrast: No results found for this or any previous visit. Knee-R CT wo contrast: No results found for this or any previous visit. Knee-L CT wo contrast: No results found for this or any previous visit. Knee-R DG 1-2 views: No results found for this or any previous visit. Knee-L DG 1-2 views: No results found for this or any previous visit. Knee-R DG 3 views: No results found for this or any previous visit. Knee-L DG 3 views: No results found for this or any previous visit. Knee-R DG 4 views: No results found for this or any previous visit. Knee-L DG 4 views: No results found for this or any previous visit. Knee-R DG Arthrogram: No results found for this or any previous visit. Knee-L DG Arthrogram: No results found for this or any previous visit.  Ankle Imaging: Ankle-R DG Complete: No results found for this or any previous visit. Ankle-L DG Complete:  Results for orders placed during the hospital encounter of 09/25/13  DG Ankle Complete Left   Narrative CLINICAL DATA:  Fall with twisting injury of left ankle and diffuse soft tissue swelling.  EXAM: LEFT ANKLE COMPLETE - 3+ VIEW  COMPARISON:  None.  FINDINGS: Diffuse soft tissue  swelling is identified surrounding the ankle and proximal foot. In the lateral projection, there are avulsion fractures involving the neck of the talus and the superior surface of the navicular bone. The talar avulsion shows single mildly displaced fragment. The superior navicular avulsions show mild displacement of several cortical avulsive fragments.  The ankle mortise shows normal alignment. No evidence of ankle fracture.  IMPRESSION: Acute avulsive injuries involving the neck of the talus and superior surface of the navicular bone with mild displacement of several cortical avulsive fragments. Diffuse soft tissue swelling is present.   Electronically Signed   By: Aletta Edouard M.D.   On: 09/25/2013 12:20     Foot Imaging: Foot-R DG Complete: No results found for this or any previous visit. Foot-L DG Complete: No results found for this or any previous visit.  Elbow Imaging: Elbow-R DG Complete: No results found for this or any previous visit. Elbow-L DG Complete: No results found for this or any previous visit.  Wrist Imaging: Wrist-R DG Complete: No results found for this or any previous visit. Wrist-L DG Complete: No results found for this or any previous visit.  Hand Imaging: Hand-R DG Complete: No results found for this or any previous visit. Hand-L DG Complete: No results found for this or any previous visit.  Complexity Note: Imaging results reviewed. Results shared with Ms. Schlicker, using Layman's terms.                         ROS  Cardiovascular: {Hx; Cardiovascular History:210120525} Pulmonary or Respiratory: {Hx; Pumonary and/or Respiratory History:210120523} Neurological: {Hx; Neurological:210120504} Review of Past Neurological Studies: No results found for this or any previous visit. Psychological-Psychiatric: {Hx; Psychological-Psychiatric History:210120512} Gastrointestinal: {Hx; Gastrointestinal:210120527} Genitourinary: {Hx;  Genitourinary:210120506} Hematological: {Hx; Hematological:210120510} Endocrine: {Hx; Endocrine history:210120509}  Rheumatologic: {Hx; Rheumatological:210120530} Musculoskeletal: {Hx; Musculoskeletal:210120528} Work History: {Hx; Work history:210120514}  Allergies  Ms. Cantin is allergic to sulfa antibiotics.  Laboratory Chemistry  Inflammation Markers (CRP: Acute Phase) (ESR: Chronic Phase) Lab Results  Component Value Date   ESRSEDRATE 25 (H) 10/28/2013   LATICACIDVEN 6.2 (H) 04/27/2013                         Rheumatology Markers No results found for: RF, ANA, LABURIC, URICUR, LYMEIGGIGMAB, LYMEABIGMQN, HLAB27                      Renal Function Markers Lab Results  Component Value Date   BUN 13 07/31/2017   CREATININE 0.82 54/27/0623   BCR NOT APPLICABLE 76/28/3151   GFRAA >60 07/31/2017   GFRNONAA >60 07/31/2017                             Hepatic Function Markers Lab Results  Component Value Date   AST 15 07/31/2017   ALT 14 07/31/2017   ALBUMIN 3.4 (L) 07/31/2017   ALKPHOS 51 07/31/2017                        Electrolytes Lab Results  Component Value Date   NA 142 07/31/2017   K 4.3 07/31/2017   CL 112 (H) 07/31/2017   CALCIUM 8.7 (L) 07/31/2017                        Neuropathy Markers Lab Results  Component Value Date   VITAMINB12 307 05/19/2014   FOLATE 8.0 05/19/2014   HGBA1C 5.3 01/31/2017   HIV NON-REACTIVE 01/31/2017                        Bone Pathology Markers No results found for: VD25OH, VD125OH2TOT, VO1607PX1, GG2694WN4, 25OHVITD1, 25OHVITD2, 25OHVITD3, TESTOFREE, TESTOSTERONE                       Coagulation Parameters Lab Results  Component Value Date   INR 1.3 (A) 09/03/2017   LABPROT 31.1 (H) 07/31/2017   APTT 54 (H) 02/17/2015   PLT 269 07/31/2017   DDIMER 0.76 (H) 01/27/2014                        Cardiovascular Markers Lab Results  Component Value Date   TROPONINI <0.30 04/27/2013   HGB 10.7 (L) 07/31/2017   HCT  33.4 (L) 07/31/2017                         CA Markers No results found for: CEA, CA125, LABCA2                      Note: Lab results reviewed.  PFSH  Drug: Ms. Yazzie  reports that she does not use drugs. Alcohol:  reports that she drinks about 2.0 standard drinks of alcohol per week. Tobacco:  reports that she has been smoking cigarettes. She has a 4.50 pack-year smoking history. She has never used smokeless tobacco. Medical:  has a past medical history of Abdominal cramps (08/30/2015), Anemia, Blood transfusion without reported diagnosis, Closed left ankle fracture, Clotting disorder ([place]), DVT (deep venous thrombosis) ([place]), Fibroid tumor, Hidradenitis, High blood pressure, Hypertension, Obesity, OSA on CPAP,  Oxygen deficiency, Pregnancy, Pulmonary embolism ([place]) (04/2013), Sleep apnea, and Tobacco use disorder. Family: family history includes Cancer in her maternal grandmother; Diabetes in her paternal aunt and paternal uncle; Heart attack in her paternal grandmother; Hypertension in her mother; Other in her father.  Past Surgical History:  Procedure Laterality Date  . ABSCESS DRAINAGE Bilateral 01/10/15  . CYST REMOVAL TRUNK    . IVC FILTER PLACEMENT ([facility] HX)     Active Ambulatory Problems    Diagnosis Date Noted  . Pleuritic chest pain 04/27/2013  . SOB (shortness of breath) 04/27/2013  . Hyperglycemia 04/27/2013  . Anemia 04/27/2013  . Tobacco use disorder 04/27/2013  . Saddle embolus of pulmonary artery with acute cor pulmonale ([place]) 04/27/2013  . Secondary pulmonary hypertension 07/06/2013  . Obstructive sleep apnea 07/06/2013  . Back pain with left-sided radiculopathy 10/31/2013  . Iron deficiency anemia due to chronic blood loss 02/05/2014  . Benign essential hypertension 07/21/2014  . Morbid obesity ([place]) 08/18/2014  . Fibroid, uterine 08/18/2014  . Abdominal cramps 08/30/2015  . History of pulmonary embolism 08/27/2017  . Encounter for therapeutic drug monitoring  09/03/2017   Resolved Ambulatory Problems    Diagnosis Date Noted  . PE (pulmonary embolism) 04/27/2013  . Pulmonary embolism ([place]) 04/21/2013   Past Medical History:  Diagnosis Date  . Blood transfusion without reported diagnosis   . Closed left ankle fracture   . Clotting disorder ([place])   . DVT (deep venous thrombosis) ([place])   . Fibroid tumor   . Hidradenitis   . High blood pressure   . Hypertension   . Obesity   . OSA on CPAP   . Oxygen deficiency   . Pregnancy   . Sleep apnea    Constitutional Exam  General appearance: Well nourished, well developed, and well hydrated. In no apparent acute distress Vitals:   09/04/17 1118  BP: (!) 142/76  Pulse: (!) 155  Resp: 18  Temp: 98.2 F (36.8 C)  TempSrc: Oral  SpO2: 98%  Weight: (!) 361 lb (163.7 kg)  Height: _0  (1.778 m)   BMI Assessment: Estimated body mass index is 51.8 kg/m as calculated from the following:   Height as of this encounter: _1  (1.778 m).   Weight as of this encounter: 361 lb (163.7 kg).  BMI interpretation table: BMI level Category Range association with higher incidence of chronic pain  <18 kg/m2 Underweight   18.5-24.9 kg/m2 Ideal body weight   25-29.9 kg/m2 Overweight Increased incidence by 20%  30-34.9 kg/m2 Obese (Class I) Increased incidence by 68%  35-39.9 kg/m2 Severe obesity (Class II) Increased incidence by 136%  >40 kg/m2 Extreme obesity (Class III) Increased incidence by 254%   Patient's current BMI Ideal Body weight  Body mass index is 51.8 kg/m. Ideal body weight: 68.5 kg (151 lb 0.2 oz) Adjusted ideal body weight: 106.6 kg (235 lb 0.2 oz)   BMI Readings from Last 4 Encounters:  09/04/17 51.80 kg/m  08/27/17 52.23 kg/m  07/31/17 53.29 kg/m  06/12/17 51.95 kg/m   Wt Readings from Last 4 Encounters:  09/04/17 (!) 361 lb (163.7 kg)  08/27/17 (!) 364 lb (165.1 kg)  07/31/17 (!) 371 lb 6.4 oz (168.5 kg)  06/12/17 (!) 362 lb 0.6 oz (164.2 kg)  Psych/Mental status:  Alert, oriented x 3 (person, place, & time)       Eyes: PERLA Respiratory: No evidence of acute respiratory distress  Cervical Spine Area Exam  Skin & Axial Inspection: No masses, redness,  edema, swelling, or associated skin lesions Alignment: Symmetrical Functional ROM: Unrestricted ROM      Stability: No instability detected Muscle Tone/Strength: Functionally intact. No obvious neuro-muscular anomalies detected. Sensory (Neurological): Unimpaired Palpation: No palpable anomalies              Upper Extremity (UE) Exam    Side: Right upper extremity  Side: Left upper extremity  Skin & Extremity Inspection: Skin color, temperature, and hair growth are WNL. No peripheral edema or cyanosis. No masses, redness, swelling, asymmetry, or associated skin lesions. No contractures.  Skin & Extremity Inspection: Skin color, temperature, and hair growth are WNL. No peripheral edema or cyanosis. No masses, redness, swelling, asymmetry, or associated skin lesions. No contractures.  Functional ROM: Unrestricted ROM          Functional ROM: Unrestricted ROM          Muscle Tone/Strength: Functionally intact. No obvious neuro-muscular anomalies detected.  Muscle Tone/Strength: Functionally intact. No obvious neuro-muscular anomalies detected.  Sensory (Neurological): Unimpaired          Sensory (Neurological): Unimpaired          Palpation: No palpable anomalies              Palpation: No palpable anomalies              Provocative Test(s):  Phalen's test: deferred Tinel's test: deferred Apley's scratch test (touch opposite shoulder):  Action 1 (Across chest): deferred Action 2 (Overhead): deferred Action 3 (LB reach): deferred   Provocative Test(s):  Phalen's test: deferred Tinel's test: deferred Apley's scratch test (touch opposite shoulder):  Action 1 (Across chest): deferred Action 2 (Overhead): deferred Action 3 (LB reach): deferred    Thoracic Spine Area Exam  Skin & Axial Inspection: No  masses, redness, or swelling Alignment: Symmetrical Functional ROM: Unrestricted ROM Stability: No instability detected Muscle Tone/Strength: Functionally intact. No obvious neuro-muscular anomalies detected. Sensory (Neurological): Unimpaired Muscle strength & Tone: No palpable anomalies  Lumbar Spine Area Exam  Skin & Axial Inspection: No masses, redness, or swelling Alignment: Symmetrical Functional ROM: Unrestricted ROM       Stability: No instability detected Muscle Tone/Strength: Functionally intact. No obvious neuro-muscular anomalies detected. Sensory (Neurological): Unimpaired Palpation: No palpable anomalies       Provocative Tests: Hyperextension/rotation test: deferred today       Lumbar quadrant test (Kemp's test): deferred today       Lateral bending test: deferred today       Patrick's Maneuver: deferred today                   FABER test: deferred today                   S-I anterior distraction/compression test: deferred today         S-I lateral compression test: deferred today         S-I Thigh-thrust test: deferred today         S-I Gaenslen's test: deferred today          Gait & Posture Assessment  Ambulation: Unassisted Gait: Relatively normal for age and body habitus Posture: WNL   Lower Extremity Exam    Side: Right lower extremity  Side: Left lower extremity  Stability: No instability observed          Stability: No instability observed          Skin & Extremity Inspection: Skin color, temperature, and hair growth are WNL. No  peripheral edema or cyanosis. No masses, redness, swelling, asymmetry, or associated skin lesions. No contractures.  Skin & Extremity Inspection: Skin color, temperature, and hair growth are WNL. No peripheral edema or cyanosis. No masses, redness, swelling, asymmetry, or associated skin lesions. No contractures.  Functional ROM: Unrestricted ROM                  Functional ROM: Unrestricted ROM                  Muscle  Tone/Strength: Functionally intact. No obvious neuro-muscular anomalies detected.  Muscle Tone/Strength: Functionally intact. No obvious neuro-muscular anomalies detected.  Sensory (Neurological): Unimpaired  Sensory (Neurological): Unimpaired  Palpation: No palpable anomalies  Palpation: No palpable anomalies   Assessment  Primary Diagnosis & Pertinent Problem List: There were no encounter diagnoses.  Visit Diagnosis (New problems to examiner): No diagnosis found. Plan of Care (Initial workup plan)  Note: Please be advised that as per protocol, today's visit has been an evaluation only. We have not taken over the patient's controlled substance management.  Problem-specific plan: No problem-specific Assessment & Plan notes found for this encounter.  Ordered Lab-work, Procedure(s), Referral(s), & Consult(s): No orders of the defined types were placed in this encounter.  Pharmacotherapy (current): Medications ordered:  No orders of the defined types were placed in this encounter.  Medications administered during this visit: Leylani R. [place] had no medications administered during this visit.   Pharmacological management options:  Opioid Analgesics: The patient was informed that there is no guarantee that she would be a candidate for opioid analgesics. The decision will be made following CDC guidelines. This decision will be based on the results of diagnostic studies, as well as Ms. Davisson's risk profile.   Membrane stabilizer: To be determined at a later time  Muscle relaxant: To be determined at a later time  NSAID: To be determined at a later time  Other analgesic(s): To be determined at a later time   Interventional management options: Ms. Capps was informed that there is no guarantee that she would be a candidate for interventional therapies. The decision will be based on the results of diagnostic studies, as well as Ms. Hermida's risk profile.  Procedure(s) under consideration:   ***   Provider-requested follow-up: No follow-ups on file.  Future Appointments  Date Time Provider [place]  09/15/2017  9:45 AM [facility]  10/31/2017 10:50 AM [facility] LAB [facility] None  11/07/2017 10:30 AM Higgs, Mathis Dad, MD [facility] None    Primary Care Physician: [name] Macadam, MD Location: [facility] Note by: [name] [name], M.D, Date: 09/04/2017; Time: 11:39 AM  There are no Patient Instructions on file for this visit.Patient's Name: [name] [name]  MRN: [id number]  Referring Provider: Caren Macadam, MD  DOB: [birth year]  PCP: [name] Macadam, MD  DOS: 09/04/2017  Note by: [name] Santa, MD  Service setting: Ambulatory outpatient  Specialty: Interventional Pain Management  Location: [facility] (AMB) Pain Management Facility  Visit type: Initial Patient Evaluation  Patient type: New Patient   Primary Reason(s) for Visit: Encounter for initial evaluation of one or more chronic problems (new to examiner) potentially causing chronic pain, and posing a threat to normal musculoskeletal function. (Level of risk: High) CC: Back Pain (lower)  HPI  Ms. Tull is a 42 y.o. year old, female patient, who comes today to see [place] for the first time for an initial evaluation of her chronic pain. She has Pleuritic chest pain;  SOB (shortness of breath); Hyperglycemia; Anemia; Tobacco use disorder; Saddle embolus of pulmonary artery with acute cor pulmonale ([place]); Secondary pulmonary hypertension; Obstructive sleep apnea; Back pain with left-sided radiculopathy; Iron deficiency anemia due to chronic blood loss; Benign essential hypertension; Morbid obesity ([place]); Fibroid, uterine; Abdominal cramps; History of pulmonary embolism; and Encounter for therapeutic drug monitoring on their problem list. Today she comes in for evaluation of her Back Pain (lower)  Pain Assessment: Location: Lower Back Radiating: down sides of both legs to  ankles Onset: More than a month ago Duration: Chronic pain Quality: Constant, Throbbing, Sharp, Shooting Severity: 10-Worst pain ever/10 (subjective, self-reported pain score)  Note: Reported level is compatible with observation.                         When using our objective Pain Scale, levels between 6 and 10/10 are said to belong in an emergency room, as it progressively worsens from a 6/10, described as severely limiting, requiring emergency care not usually available at an outpatient pain management facility. At a 6/10 level, communication becomes difficult and requires great effort. Assistance to reach the emergency department may be required. Facial flushing and profuse sweating along with potentially dangerous increases in heart rate and blood pressure will be evident. Effect on ADL: sometimes pt stays in bed for up to 2 days at a time because of pain; hard to care for 42 year old Timing: Constant Modifying factors: hot compresses, cold packs, meds BP: (!) 142/76  HR: (!) 155  Onset and Duration: {Hx; Onset and Duration:210120511} Cause of pain: {Hx; Cause:210120521} Severity: {Pain Severity:210120502} Timing: {Symptoms; Timing:210120501} Aggravating Factors: {Causes; Aggravating pain factors:210120507} Alleviating Factors: {Causes; Alleviating Factors:210120500} Associated Problems: {Hx; Associated problems:210120515} Quality of Pain: {Hx; Symptom quality or Descriptor:210120531} Previous Examinations or Tests: {Hx; Previous examinations or test:210120529} Previous Treatments: {Hx; Previous Treatment:210120503}  The patient comes into the clinics today for the first time for a chronic pain management evaluation. ***  Today I took the time to provide the patient with information regarding my pain practice. The patient was informed that my practice is divided into two sections: an interventional pain management section, as well as a completely separate and distinct medication  management section. I explained that I have procedure days for my interventional therapies, and evaluation days for follow-ups and medication management. Because of the amount of documentation required during both, they are kept separated. This means that there is the possibility that she may be scheduled for a procedure on one day, and medication management the next. I have also informed her that because of staffing and facility limitations, I no longer take patients for medication management only. To illustrate the reasons for this, I gave the patient the example of surgeons, and how inappropriate it would be to refer a patient to his/her care, just to write for the post-surgical antibiotics on a surgery done by a different surgeon.   Because interventional pain management is my board-certified specialty, the patient was informed that joining my practice means that they are open to any and all interventional therapies. I made it clear that this does not mean that they will be forced to have any procedures done. What this means is that I believe interventional therapies to be essential part of the diagnosis and proper management of chronic pain conditions. Therefore, patients not interested in these interventional alternatives will be better served under the care of a different practitioner.  The patient was also made  aware of my Comprehensive Pain Management Safety Guidelines where by joining my practice, they limit all of their nerve blocks and joint injections to those done by our practice, for as long as we are retained to manage their care.   Historic Controlled Substance Pharmacotherapy Review  PMP and historical list of controlled substances: ***  Highest opioid analgesic regimen found: ***  Most recent opioid analgesic: ***  Current opioid analgesics: ***  Highest recorded MME/day: *** mg/day MME/day: *** mg/day Medications: The patient did not bring the medication(s) to the appointment, as  requested in our "New Patient Package" Pharmacodynamics: Desired effects: Analgesia: The patient reports >50% benefit. Reported improvement in function: The patient reports medication allows her to accomplish basic ADLs. Clinically meaningful improvement in function (CMIF): Sustained CMIF goals met Perceived effectiveness: Described as relatively effective, allowing for increase in activities of daily living (ADL) Undesirable effects: Side-effects or Adverse reactions: None reported Historical Monitoring: The patient  reports that she does not use drugs. List of all UDS Test(s): Lab Results  Component Value Date   COCAINSCRNUR Negative 07/21/2014   COCAINSCRNUR NONE DETECTED 04/27/2013   THCU NONE DETECTED 04/27/2013   ETH 94 (H) 04/27/2013   List of other Serum/Urine Drug Screening Test(s):  Lab Results  Component Value Date   COCAINSCRNUR Negative 07/21/2014   COCAINSCRNUR NONE DETECTED 04/27/2013   THCU NONE DETECTED 04/27/2013   ETH 94 (H) 04/27/2013   Historical Background Evaluation: [place] PMP: Six (6) year initial data search conducted.             PMP NARX Score Report:  Narcotic: *** Sedative: *** Stimulant: *** [place] Department of public safety, offender search: Editor, commissioning Information) Non-contributory Risk Assessment Profile: Aberrant behavior: None observed or detected today Risk factors for fatal opioid overdose: None identified today PMP NARX Overdose Risk Score: *** Fatal overdose hazard ratio (HR): Calculation deferred Non-fatal overdose hazard ratio (HR): Calculation deferred Risk of opioid abuse or dependence: 0.7-3.0% with doses ? 36 MME/day and 6.1-26% with doses ? 120 MME/day. Substance use disorder (SUD) risk level: See below Personal History of Substance Abuse (SUD-Substance use disorder):  Alcohol: Negative  Illegal Drugs: Negative  Rx Drugs: Negative  ORT Risk Level calculation: Low Risk Opioid Risk Tool - 09/04/17 1128      Family History of Substance  Abuse   Alcohol  Negative    Illegal Drugs  Negative    Rx Drugs  Negative      Personal History of Substance Abuse   Alcohol  Negative    Illegal Drugs  Negative    Rx Drugs  Negative      Age   Age between 39-45 years   Yes      History of Preadolescent Sexual Abuse   History of Preadolescent Sexual Abuse  Negative or Female      Psychological Disease   Psychological Disease  Negative    Depression  Negative      Total Score   Opioid Risk Tool Scoring  1    Opioid Risk Interpretation  Low Risk      ORT Scoring interpretation table:  Score <3 = Low Risk for SUD  Score between 4-7 = Moderate Risk for SUD  Score >8 = High Risk for Opioid Abuse   PHQ-2 Depression Scale:  Total score: 0  PHQ-2 Scoring interpretation table: (Score and probability of major depressive disorder)  Score 0 = No depression  Score 1 = 15.4% Probability  Score 2 = 21.1% Probability  Score 3 = 38.4% Probability  Score 4 = 45.5% Probability  Score 5 = 56.4% Probability  Score 6 = 78.6% Probability   PHQ-9 Depression Scale:  Total score: 0  PHQ-9 Scoring interpretation table:  Score 0-4 = No depression  Score 5-9 = Mild depression  Score 10-14 = Moderate depression  Score 15-19 = Moderately severe depression  Score 20-27 = Severe depression (2.4 times higher risk of SUD and 2.89 times higher risk of overuse)   Pharmacologic Plan: As per protocol, I have not taken over any controlled substance management, pending the results of ordered tests and/or consults.            Initial impression: Pending review of available data and ordered tests.  Meds   Current Outpatient Medications:  .  amLODipine (NORVASC) 10 MG tablet, Take 1 tablet (10 mg total) by mouth daily., Disp: 90 tablet, Rfl: 3 .  lisinopril (PRINIVIL,ZESTRIL) 40 MG tablet, Take 1 tablet (40 mg total) by mouth daily., Disp: 90 tablet, Rfl: 3 .  norethindrone (MICRONOR,CAMILA,ERRIN) 0.35 MG tablet, TAKE ONE TABLET BY MOUTH ONCE DAILY,  Disp: 28 tablet, Rfl: 3 .  Oxycodone HCl 10 MG TABS, Take one tablet every 8 hours as needed for uncontrolled pain, Disp: 15 tablet, Rfl: 0 .  triamterene-hydrochlorothiazide (MAXZIDE-25) 37.5-25 MG tablet, TAKE 1 TABLET BY MOUTH ONCE A DAY., Disp: 30 tablet, Rfl: 3 .  warfarin (COUMADIN) 1 MG tablet, Take 1 tablet (1 mg total) by mouth daily., Disp: 30 tablet, Rfl: 3 .  warfarin (COUMADIN) 10 MG tablet, Take 1 tablet (10 mg total) by mouth daily., Disp: 30 tablet, Rfl: 3 .  warfarin (COUMADIN) 2.5 MG tablet, Take 1 tablet (2.5 mg total) by mouth daily., Disp: 30 tablet, Rfl: 3 No current facility-administered medications for this visit.   Facility-Administered Medications Ordered in Other Visits:  .  0.9 %  sodium chloride infusion, , Intravenous, Continuous, Kefalas, Manon Hilding, PA-C, Last Rate: 20 mL/hr at 12/22/15 1430  Imaging Review  Cervical Imaging: Cervical MR wo contrast: No results found for this or any previous visit. Cervical MR wo contrast: No procedure found. Cervical MR w/wo contrast: No results found for this or any previous visit. Cervical MR w contrast: No results found for this or any previous visit. Cervical CT wo contrast: No results found for this or any previous visit. Cervical CT w/wo contrast: No results found for this or any previous visit. Cervical CT w/wo contrast: No results found for this or any previous visit. Cervical CT w contrast: No results found for this or any previous visit. Cervical CT outside: No results found for this or any previous visit. Cervical DG 1 view: No results found for this or any previous visit. Cervical DG 2-3 views: No results found for this or any previous visit. Cervical DG F/E views: No results found for this or any previous visit. Cervical DG 2-3 clearing views: No results found for this or any previous visit. Cervical DG Bending/F/E views: No results found for this or any previous visit. Cervical DG complete: No results found for  this or any previous visit. Cervical DG Myelogram views: No results found for this or any previous visit. Cervical DG Myelogram views: No results found for this or any previous visit. Cervical Discogram views: No results found for this or any previous visit.  Shoulder Imaging: Shoulder-R MR w contrast: No results found for this or any previous visit. Shoulder-L MR w contrast: No results found for this or any  previous visit. Shoulder-R MR w/wo contrast: No results found for this or any previous visit. Shoulder-L MR w/wo contrast: No results found for this or any previous visit. Shoulder-R MR wo contrast: No results found for this or any previous visit. Shoulder-L MR wo contrast: No results found for this or any previous visit. Shoulder-R CT w contrast: No results found for this or any previous visit. Shoulder-L CT w contrast: No results found for this or any previous visit. Shoulder-R CT w/wo contrast: No results found for this or any previous visit. Shoulder-L CT w/wo contrast: No results found for this or any previous visit. Shoulder-R CT wo contrast: No results found for this or any previous visit. Shoulder-L CT wo contrast: No results found for this or any previous visit. Shoulder-R DG Arthrogram: No results found for this or any previous visit. Shoulder-L DG Arthrogram: No results found for this or any previous visit. Shoulder-R DG 1 view: No results found for this or any previous visit. Shoulder-L DG 1 view: No results found for this or any previous visit. Shoulder-R DG: No results found for this or any previous visit. Shoulder-L DG: No results found for this or any previous visit.  Thoracic Imaging: Thoracic MR wo contrast: No results found for this or any previous visit. Thoracic MR wo contrast: No procedure found. Thoracic MR w/wo contrast: No results found for this or any previous visit. Thoracic MR w contrast: No results found for this or any previous visit. Thoracic CT wo  contrast: No results found for this or any previous visit. Thoracic CT w/wo contrast: No results found for this or any previous visit. Thoracic CT w/wo contrast: No results found for this or any previous visit. Thoracic CT w contrast: No results found for this or any previous visit. Thoracic DG 2-3 views: No results found for this or any previous visit. Thoracic DG 4 views: No results found for this or any previous visit. Thoracic DG: No results found for this or any previous visit. Thoracic DG w/swimmers view: No results found for this or any previous visit. Thoracic DG Myelogram views: No results found for this or any previous visit. Thoracic DG Myelogram views: No results found for this or any previous visit.  Lumbosacral Imaging: Lumbar MR wo contrast: No results found for this or any previous visit. Lumbar MR wo contrast: No procedure found. Lumbar MR w/wo contrast: No results found for this or any previous visit. Lumbar MR w/wo contrast: No results found for this or any previous visit. Lumbar MR w contrast: No results found for this or any previous visit. Lumbar CT wo contrast:  Results for orders placed during the hospital encounter of 08/14/15  CT Lumbar Spine Wo Contrast   Narrative CLINICAL DATA:  Chronic low back pain extending at the right lower extremity. Symptoms have progressed since yesterday. The patient is unable to undergo MRI. EXAM: CT LUMBAR SPINE WITHOUT CONTRAST TECHNIQUE: Multidetector CT imaging of the lumbar spine was performed without intravenous contrast administration. Multiplanar CT image reconstructions were also generated. COMPARISON:  None. FINDINGS: Five non rib-bearing lumbar type vertebral bodies are present. Mild focal leftward curvature of the lumbar spine is centered at L4-5. There slight anterolisthesis at L4-5 as well measuring 5 mm. Alignment is otherwise anatomic. Vertebral body heights are intact. An IVC filter is in place. Limited  imaging of the abdomen is otherwise unremarkable. Chronic bone changes are associated sacroiliitis bilaterally. L1-2: Mild facet hypertrophy is present bilaterally without significant stenosis. L2-3: Mild facet hypertrophy  is present. There is no significant stenosis. L3-4: A broad-based disc protrusion is asymmetric to the left moderate facet hypertrophy and ligamentum flavum thickening is present. Mild central canal stenosis and moderate foraminal narrowing is present bilaterally, worse on the left. L4-5: A broad-based disc protrusion is present. Advanced facet hypertrophy is noted bilaterally. A vacuum phenomenon is present in the right facet. Severe central canal stenosis is present. Severe right and moderate left foraminal narrowing is evident. L5-S1: A central disc protrusion is present. Mild facet hypertrophy is noted bilaterally. This results an mild central and right foraminal narrowing. IMPRESSION: 1. Advanced facet hypertrophy results and 5 mm grade 1 anterolisthesis at L4-5. 2. There is uncovering of a broad-based disc protrusion at L4-5 with severe central canal stenosis, severe right, and moderate left foraminal narrowing. 3. Mild facet hypertrophy at L1-2 and L2-3 without significant stenosis at these levels. 4. Mild central and moderate bilateral foraminal stenosis at L3-4. 5. Mild central and right foraminal stenosis at L5-S1. Electronically Signed   By: San Morelle M.D.   On: 08/14/2015 17:05   Lumbar CT w/wo contrast: No results found for this or any previous visit. Lumbar CT w/wo contrast: No results found for this or any previous visit. Lumbar CT w contrast:  Results for orders placed during the hospital encounter of 01/20/17  CT LUMBAR SPINE W CONTRAST   Narrative CLINICAL DATA:  Right low back and hip pain occasionally radiating into the right leg.  EXAM: LUMBAR MYELOGRAM  FLUOROSCOPY TIME:  Radiation Exposure Index (as provided by  the fluoroscopic device): 5601.00 microGray*m^2  Fluoroscopy Time (in minutes and seconds):  2 minutes 24 seconds  PROCEDURE: Dr. Christella Noa consented the patient and initially attempted a lumbar puncture without return of CSF. I then re-prepped and draped the patient in the usual sterile fashion and provided additional local anesthesia with 1% lidocaine. I performed a lumbar puncture using a 7 inch 22 gauge spinal needle via a right paramedian approach at L2-3. Using a single pass through the dura, the needle was placed within the thecal sac, with return of clear CSF. 15 mL of Isovue M-200 was injected into the thecal sac, with normal opacification of the nerve roots and cauda equina consistent with free flow within the subarachnoid space.  I personally performed the lumbar puncture and administered the intrathecal contrast. I also personally supervised acquisition of the myelogram images.  TECHNIQUE: Contiguous axial images were obtained through the Lumbar spine after the intrathecal infusion of infusion. Coronal and sagittal reconstructions were obtained of the axial image sets.  COMPARISON:  Noncontrast lumbar spine CT 08/14/2015  FINDINGS: LUMBAR MYELOGRAM FINDINGS:  There are 5 non rib-bearing lumbar type vertebrae. There is grade 1 anterolisthesis of L4 on L5 which increases with standing and flexion. Associated spinal stenosis at this level appears severe with standing. There is a small ventral extradural defect at L3-4 which does not significantly increase with standing and does not result in high-grade stenosis. The thecal sac appears widely patent elsewhere. An IVC filter is noted.  CT LUMBAR MYELOGRAM FINDINGS:  Grade 1 anterolisthesis of L4 on L5 in the supine position measures 4 mm, unchanged from the prior CT. There is also trace retrolisthesis of L3 on L4. Mild depression of the L4 superior endplate on the left is unchanged. No acute fracture or  destructive osseous process is identified. The conus medullaris terminates at L1.  Chronic sacroiliitis is noted with associated sclerosis on both sides of the joint. An IVC filter is  in place below the level the renal veins. Partially visualized uterine enlargement compatible with a fibroid described on prior ultrasound.  T12-L1: Mild facet arthrosis without disc herniation or stenosis.  L1-2:  Mild facet arthrosis without disc herniation or stenosis.  L2-3: Mild-to-moderate facet arthrosis without disc herniation or stenosis.  L3-4: Circumferential disc bulging, left foraminal disc osteophyte complex, and mild-to-moderate facet and ligamentum flavum hypertrophy result in borderline spinal stenosis and mild right and moderate left neural foraminal stenosis, similar to the prior CT.  L4-5: Mild disc space narrowing. Anterolisthesis, bulging uncovered disc, right foraminal/ extraforaminal disc protrusion, congenitally short pedicles, and severe facet arthrosis result in moderate spinal stenosis and severe right and mild-to-moderate left neural foraminal stenosis, similar to prior. Potential right L4 nerve impingement in and lateral to the foramen. Widened facet joints containing gas consistent with instability demonstrated on upright radiographs.  L5-S1: Mild disc space narrowing. Mild disc bulging and facet hypertrophy result in borderline to mild right greater than left neural foraminal stenosis without spinal stenosis, unchanged.  IMPRESSION: 1. Severe facet arthrosis at L4-5 with grade 1 anterolisthesis which increases with standing and results in severe spinal stenosis. Severe right neural foraminal stenosis as well. 2. Borderline spinal stenosis and mild right and moderate left foraminal stenosis at L3-4. 3. Mild foraminal narrowing at L5-S1.   Electronically Signed   By: Logan Bores M.D.   On: 01/20/2017 15:39    Lumbar DG 1V: No results found for this or any  previous visit. Lumbar DG 1V (Clearing): No results found for this or any previous visit. Lumbar DG 2-3V (Clearing): No results found for this or any previous visit. Lumbar DG 2-3 views: No results found for this or any previous visit. Lumbar DG (Complete) 4+V:  Results for orders placed during the hospital encounter of 10/23/16  DG Lumbar Spine Complete   Narrative CLINICAL DATA:  Low back pain radiating to the left leg  EXAM: LUMBAR SPINE - COMPLETE 4+ VIEW  COMPARISON:  Lumbar spine CT 08/14/2015  FINDINGS: Unchanged grade 1 anterolisthesis at L4-L5 due to facet arthrosis. Vertebral body heights are preserved. Mild narrowing of the intervertebral disc space at L4-5 and L5-S1. Lower lumbar facet hypertrophy at L4-L5 and L5-S1. There is an IVC filter.  IMPRESSION: Unchanged grade 1 anterolisthesis at L4-L5. No acute abnormality of the lumbar spine.   Electronically Signed   By: Ulyses Jarred M.D.   On: 10/23/2016 13:58    Lumbar DG F/E views: No results found for this or any previous visit. Lumbar DG Bending views: No results found for this or any previous visit. Lumbar DG Myelogram views: No results found for this or any previous visit. Lumbar DG Myelogram: No results found for this or any previous visit. Lumbar DG Myelogram: No results found for this or any previous visit. Lumbar DG Myelogram: No results found for this or any previous visit. Lumbar DG Myelogram Lumbosacral:  Results for orders placed during the hospital encounter of 01/20/17  DG MYELOGRAPHY LUMBAR INJ LUMBOSACRAL   Narrative CLINICAL DATA:  Right low back and hip pain occasionally radiating into the right leg.  EXAM: LUMBAR MYELOGRAM  FLUOROSCOPY TIME:  Radiation Exposure Index (as provided by the fluoroscopic device): 5601.00 microGray*m^2  Fluoroscopy Time (in minutes and seconds):  2 minutes 24 seconds  PROCEDURE: Dr. Christella Noa consented the patient and initially attempted a lumbar puncture  without return of CSF. I then re-prepped and draped the patient in the usual sterile fashion and provided additional local  anesthesia with 1% lidocaine. I performed a lumbar puncture using a 7 inch 22 gauge spinal needle via a right paramedian approach at L2-3. Using a single pass through the dura, the needle was placed within the thecal sac, with return of clear CSF. 15 mL of Isovue M-200 was injected into the thecal sac, with normal opacification of the nerve roots and cauda equina consistent with free flow within the subarachnoid space.  I personally performed the lumbar puncture and administered the intrathecal contrast. I also personally supervised acquisition of the myelogram images.  TECHNIQUE: Contiguous axial images were obtained through the Lumbar spine after the intrathecal infusion of infusion. Coronal and sagittal reconstructions were obtained of the axial image sets.  COMPARISON:  Noncontrast lumbar spine CT 08/14/2015  FINDINGS: LUMBAR MYELOGRAM FINDINGS:  There are 5 non rib-bearing lumbar type vertebrae. There is grade 1 anterolisthesis of L4 on L5 which increases with standing and flexion. Associated spinal stenosis at this level appears severe with standing. There is a small ventral extradural defect at L3-4 which does not significantly increase with standing and does not result in high-grade stenosis. The thecal sac appears widely patent elsewhere. An IVC filter is noted.  CT LUMBAR MYELOGRAM FINDINGS:  Grade 1 anterolisthesis of L4 on L5 in the supine position measures 4 mm, unchanged from the prior CT. There is also trace retrolisthesis of L3 on L4. Mild depression of the L4 superior endplate on the left is unchanged. No acute fracture or destructive osseous process is identified. The conus medullaris terminates at L1.  Chronic sacroiliitis is noted with associated sclerosis on both sides of the joint. An IVC filter is in place below the level  the renal veins. Partially visualized uterine enlargement compatible with a fibroid described on prior ultrasound.  T12-L1: Mild facet arthrosis without disc herniation or stenosis.  L1-2:  Mild facet arthrosis without disc herniation or stenosis.  L2-3: Mild-to-moderate facet arthrosis without disc herniation or stenosis.  L3-4: Circumferential disc bulging, left foraminal disc osteophyte complex, and mild-to-moderate facet and ligamentum flavum hypertrophy result in borderline spinal stenosis and mild right and moderate left neural foraminal stenosis, similar to the prior CT.  L4-5: Mild disc space narrowing. Anterolisthesis, bulging uncovered disc, right foraminal/ extraforaminal disc protrusion, congenitally short pedicles, and severe facet arthrosis result in moderate spinal stenosis and severe right and mild-to-moderate left neural foraminal stenosis, similar to prior. Potential right L4 nerve impingement in and lateral to the foramen. Widened facet joints containing gas consistent with instability demonstrated on upright radiographs.  L5-S1: Mild disc space narrowing. Mild disc bulging and facet hypertrophy result in borderline to mild right greater than left neural foraminal stenosis without spinal stenosis, unchanged.  IMPRESSION: 1. Severe facet arthrosis at L4-5 with grade 1 anterolisthesis which increases with standing and results in severe spinal stenosis. Severe right neural foraminal stenosis as well. 2. Borderline spinal stenosis and mild right and moderate left foraminal stenosis at L3-4. 3. Mild foraminal narrowing at L5-S1.   Electronically Signed   By: Logan Bores M.D.   On: 01/20/2017 15:39    Lumbar DG Diskogram views: No results found for this or any previous visit. Lumbar DG Diskogram views: No results found for this or any previous visit. Lumbar DG Epidurogram OP: No results found for this or any previous visit. Lumbar DG Epidurogram IP: No  results found for this or any previous visit.  Sacroiliac Joint Imaging: Sacroiliac Joint DG: No results found for this or any previous visit. Sacroiliac Joint  MR w/wo contrast: No results found for this or any previous visit. Sacroiliac Joint MR wo contrast: No results found for this or any previous visit.  Spine Imaging: Whole Spine DG Myelogram views: No results found for this or any previous visit. Whole Spine MR Mets screen: No results found for this or any previous visit. Whole Spine MR Mets screen: No results found for this or any previous visit. Whole Spine MR w/wo: No results found for this or any previous visit. MRA Spinal Canal w/ cm: No results found for this or any previous visit. MRA Spinal Canal wo/ cm: No procedure found. MRA Spinal Canal w/wo cm: No results found for this or any previous visit. Spine Outside MR Films: No results found for this or any previous visit. Spine Outside CT Films: No results found for this or any previous visit. CT-Guided Biopsy: No results found for this or any previous visit. CT-Guided Needle Placement: No results found for this or any previous visit. DG Spine outside: No results found for this or any previous visit. IR Spine outside: No results found for this or any previous visit. NM Spine outside: No results found for this or any previous visit.  Hip Imaging: Hip-R MR w contrast: No results found for this or any previous visit. Hip-L MR w contrast: No results found for this or any previous visit. Hip-R MR w/wo contrast: No results found for this or any previous visit. Hip-L MR w/wo contrast: No results found for this or any previous visit. Hip-R MR wo contrast: No results found for this or any previous visit. Hip-L MR wo contrast: No results found for this or any previous visit. Hip-R CT w contrast: No results found for this or any previous visit. Hip-L CT w contrast: No results found for this or any previous visit. Hip-R CT w/wo  contrast: No results found for this or any previous visit. Hip-L CT w/wo contrast: No results found for this or any previous visit. Hip-R CT wo contrast: No results found for this or any previous visit. Hip-L CT wo contrast: No results found for this or any previous visit. Hip-R DG 2-3 views: No results found for this or any previous visit. Hip-L DG 2-3 views: No results found for this or any previous visit. Hip-R DG Arthrogram: No results found for this or any previous visit. Hip-L DG Arthrogram: No results found for this or any previous visit. Hip-B DG Bilateral: No results found for this or any previous visit.  Knee Imaging: Knee-R MR w contrast: No results found for this or any previous visit. Knee-L MR w contrast: No results found for this or any previous visit. Knee-R MR w/wo contrast: No results found for this or any previous visit. Knee-L MR w/wo contrast: No results found for this or any previous visit. Knee-R MR wo contrast: No results found for this or any previous visit. Knee-L MR wo contrast: No results found for this or any previous visit. Knee-R CT w contrast: No results found for this or any previous visit. Knee-L CT w contrast: No results found for this or any previous visit. Knee-R CT w/wo contrast: No results found for this or any previous visit. Knee-L CT w/wo contrast: No results found for this or any previous visit. Knee-R CT wo contrast: No results found for this or any previous visit. Knee-L CT wo contrast: No results found for this or any previous visit. Knee-R DG 1-2 views: No results found for this or any previous visit. Knee-L  DG 1-2 views: No results found for this or any previous visit. Knee-R DG 3 views: No results found for this or any previous visit. Knee-L DG 3 views: No results found for this or any previous visit. Knee-R DG 4 views: No results found for this or any previous visit. Knee-L DG 4 views: No results found for this or any previous  visit. Knee-R DG Arthrogram: No results found for this or any previous visit. Knee-L DG Arthrogram: No results found for this or any previous visit.  Ankle Imaging: Ankle-R DG Complete: No results found for this or any previous visit. Ankle-L DG Complete:  Results for orders placed during the hospital encounter of 09/25/13  DG Ankle Complete Left   Narrative CLINICAL DATA:  Fall with twisting injury of left ankle and diffuse soft tissue swelling.  EXAM: LEFT ANKLE COMPLETE - 3+ VIEW  COMPARISON:  None.  FINDINGS: Diffuse soft tissue swelling is identified surrounding the ankle and proximal foot. In the lateral projection, there are avulsion fractures involving the neck of the talus and the superior surface of the navicular bone. The talar avulsion shows single mildly displaced fragment. The superior navicular avulsions show mild displacement of several cortical avulsive fragments.  The ankle mortise shows normal alignment. No evidence of ankle fracture.  IMPRESSION: Acute avulsive injuries involving the neck of the talus and superior surface of the navicular bone with mild displacement of several cortical avulsive fragments. Diffuse soft tissue swelling is present.   Electronically Signed   By: Aletta Edouard M.D.   On: 09/25/2013 12:20     Foot Imaging: Foot-R DG Complete: No results found for this or any previous visit. Foot-L DG Complete: No results found for this or any previous visit.  Elbow Imaging: Elbow-R DG Complete: No results found for this or any previous visit. Elbow-L DG Complete: No results found for this or any previous visit.  Wrist Imaging: Wrist-R DG Complete: No results found for this or any previous visit. Wrist-L DG Complete: No results found for this or any previous visit.  Hand Imaging: Hand-R DG Complete: No results found for this or any previous visit. Hand-L DG Complete: No results found for this or any previous visit.  Complexity  Note: Imaging results reviewed. Results shared with Ms. Laumann, using Layman's terms.                         ROS  Cardiovascular: {Hx; Cardiovascular History:210120525} Pulmonary or Respiratory: {Hx; Pumonary and/or Respiratory History:210120523} Neurological: {Hx; Neurological:210120504} Review of Past Neurological Studies: No results found for this or any previous visit. Psychological-Psychiatric: {Hx; Psychological-Psychiatric History:210120512} Gastrointestinal: {Hx; Gastrointestinal:210120527} Genitourinary: {Hx; Genitourinary:210120506} Hematological: {Hx; Hematological:210120510} Endocrine: {Hx; Endocrine history:210120509} Rheumatologic: {Hx; Rheumatological:210120530} Musculoskeletal: {Hx; Musculoskeletal:210120528} Work History: {Hx; Work history:210120514}  Allergies  Ms. Grantham is allergic to sulfa antibiotics.  Laboratory Chemistry  Inflammation Markers (CRP: Acute Phase) (ESR: Chronic Phase) Lab Results  Component Value Date   ESRSEDRATE 25 (H) 10/28/2013   LATICACIDVEN 6.2 (H) 04/27/2013                         Rheumatology Markers No results found for: RF, ANA, LABURIC, URICUR, LYMEIGGIGMAB, LYMEABIGMQN, HLAB27                      Renal Function Markers Lab Results  Component Value Date   BUN 13 07/31/2017   CREATININE 0.82 40/98/1191   BCR NOT APPLICABLE  01/31/2017   GFRAA >60 07/31/2017   GFRNONAA >60 07/31/2017                             Hepatic Function Markers Lab Results  Component Value Date   AST 15 07/31/2017   ALT 14 07/31/2017   ALBUMIN 3.4 (L) 07/31/2017   ALKPHOS 51 07/31/2017                        Electrolytes Lab Results  Component Value Date   NA 142 07/31/2017   K 4.3 07/31/2017   CL 112 (H) 07/31/2017   CALCIUM 8.7 (L) 07/31/2017                        Neuropathy Markers Lab Results  Component Value Date   VITAMINB12 307 05/19/2014   FOLATE 8.0 05/19/2014   HGBA1C 5.3 01/31/2017   HIV NON-REACTIVE 01/31/2017                         Bone Pathology Markers No results found for: VD25OH, VD125OH2TOT, PY0511MY1, RZ7356PO1, 25OHVITD1, 25OHVITD2, 25OHVITD3, TESTOFREE, TESTOSTERONE                       Coagulation Parameters Lab Results  Component Value Date   INR 1.3 (A) 09/03/2017   LABPROT 31.1 (H) 07/31/2017   APTT 54 (H) 02/17/2015   PLT 269 07/31/2017   DDIMER 0.76 (H) 01/27/2014                        Cardiovascular Markers Lab Results  Component Value Date   TROPONINI <0.30 04/27/2013   HGB 10.7 (L) 07/31/2017   HCT 33.4 (L) 07/31/2017                         CA Markers No results found for: CEA, CA125, LABCA2                      Note: Lab results reviewed.  PFSH  Drug: Ms. Alfieri  reports that she does not use drugs. Alcohol:  reports that she drinks about 2.0 standard drinks of alcohol per week. Tobacco:  reports that she has been smoking cigarettes. She has a 4.50 pack-year smoking history. She has never used smokeless tobacco. Medical:  has a past medical history of Abdominal cramps (08/30/2015), Anemia, Blood transfusion without reported diagnosis, Closed left ankle fracture, Clotting disorder ([place]), DVT (deep venous thrombosis) ([place]), Fibroid tumor, Hidradenitis, High blood pressure, Hypertension, Obesity, OSA on CPAP, Oxygen deficiency, Pregnancy, Pulmonary embolism ([place]) (04/2013), Sleep apnea, and Tobacco use disorder. Family: family history includes Cancer in her maternal grandmother; Diabetes in her paternal aunt and paternal uncle; Heart attack in her paternal grandmother; Hypertension in her mother; Other in her father.  Past Surgical History:  Procedure Laterality Date  . ABSCESS DRAINAGE Bilateral 01/10/15  . CYST REMOVAL TRUNK    . IVC FILTER PLACEMENT ([facility] HX)     Active Ambulatory Problems    Diagnosis Date Noted  . Pleuritic chest pain 04/27/2013  . SOB (shortness of breath) 04/27/2013  . Hyperglycemia 04/27/2013  . Anemia 04/27/2013  . Tobacco use disorder  04/27/2013  . Saddle embolus of pulmonary artery with acute cor pulmonale ([place]) 04/27/2013  . Secondary pulmonary hypertension 07/06/2013  .  Obstructive sleep apnea 07/06/2013  . Back pain with left-sided radiculopathy 10/31/2013  . Iron deficiency anemia due to chronic blood loss 02/05/2014  . Benign essential hypertension 07/21/2014  . Morbid obesity ([place]) 08/18/2014  . Fibroid, uterine 08/18/2014  . Abdominal cramps 08/30/2015  . History of pulmonary embolism 08/27/2017  . Encounter for therapeutic drug monitoring 09/03/2017   Resolved Ambulatory Problems    Diagnosis Date Noted  . PE (pulmonary embolism) 04/27/2013  . Pulmonary embolism ([place]) 04/21/2013   Past Medical History:  Diagnosis Date  . Blood transfusion without reported diagnosis   . Closed left ankle fracture   . Clotting disorder ([place])   . DVT (deep venous thrombosis) ([place])   . Fibroid tumor   . Hidradenitis   . High blood pressure   . Hypertension   . Obesity   . OSA on CPAP   . Oxygen deficiency   . Pregnancy   . Sleep apnea    Constitutional Exam  General appearance: Well nourished, well developed, and well hydrated. In no apparent acute distress Vitals:   09/04/17 1118  BP: (!) 142/76  Pulse: (!) 155  Resp: 18  Temp: 98.2 F (36.8 C)  TempSrc: Oral  SpO2: 98%  Weight: (!) 361 lb (163.7 kg)  Height: _0  (1.778 m)   BMI Assessment: Estimated body mass index is 51.8 kg/m as calculated from the following:   Height as of this encounter: _1  (1.778 m).   Weight as of this encounter: 361 lb (163.7 kg).  BMI interpretation table: BMI level Category Range association with higher incidence of chronic pain  <18 kg/m2 Underweight   18.5-24.9 kg/m2 Ideal body weight   25-29.9 kg/m2 Overweight Increased incidence by 20%  30-34.9 kg/m2 Obese (Class I) Increased incidence by 68%  35-39.9 kg/m2 Severe obesity (Class II) Increased incidence by 136%  >40 kg/m2 Extreme obesity (Class III) Increased  incidence by 254%   Patient's current BMI Ideal Body weight  Body mass index is 51.8 kg/m. Ideal body weight: 68.5 kg (151 lb 0.2 oz) Adjusted ideal body weight: 106.6 kg (235 lb 0.2 oz)   BMI Readings from Last 4 Encounters:  09/04/17 51.80 kg/m  08/27/17 52.23 kg/m  07/31/17 53.29 kg/m  06/12/17 51.95 kg/m   Wt Readings from Last 4 Encounters:  09/04/17 (!) 361 lb (163.7 kg)  08/27/17 (!) 364 lb (165.1 kg)  07/31/17 (!) 371 lb 6.4 oz (168.5 kg)  06/12/17 (!) 362 lb 0.6 oz (164.2 kg)  Psych/Mental status: Alert, oriented x 3 (person, place, & time)       Eyes: PERLA Respiratory: No evidence of acute respiratory distress  Cervical Spine Area Exam  Skin & Axial Inspection: No masses, redness, edema, swelling, or associated skin lesions Alignment: Symmetrical Functional ROM: Unrestricted ROM      Stability: No instability detected Muscle Tone/Strength: Functionally intact. No obvious neuro-muscular anomalies detected. Sensory (Neurological): Unimpaired Palpation: No palpable anomalies              Upper Extremity (UE) Exam    Side: Right upper extremity  Side: Left upper extremity  Skin & Extremity Inspection: Skin color, temperature, and hair growth are WNL. No peripheral edema or cyanosis. No masses, redness, swelling, asymmetry, or associated skin lesions. No contractures.  Skin & Extremity Inspection: Skin color, temperature, and hair growth are WNL. No peripheral edema or cyanosis. No masses, redness, swelling, asymmetry, or associated skin lesions. No contractures.  Functional ROM: Unrestricted ROM  Functional ROM: Unrestricted ROM          Muscle Tone/Strength: Functionally intact. No obvious neuro-muscular anomalies detected.  Muscle Tone/Strength: Functionally intact. No obvious neuro-muscular anomalies detected.  Sensory (Neurological): Unimpaired          Sensory (Neurological): Unimpaired          Palpation: No palpable anomalies              Palpation: No  palpable anomalies              Provocative Test(s):  Phalen's test: deferred Tinel's test: deferred Apley's scratch test (touch opposite shoulder):  Action 1 (Across chest): deferred Action 2 (Overhead): deferred Action 3 (LB reach): deferred   Provocative Test(s):  Phalen's test: deferred Tinel's test: deferred Apley's scratch test (touch opposite shoulder):  Action 1 (Across chest): deferred Action 2 (Overhead): deferred Action 3 (LB reach): deferred    Thoracic Spine Area Exam  Skin & Axial Inspection: No masses, redness, or swelling Alignment: Symmetrical Functional ROM: Unrestricted ROM Stability: No instability detected Muscle Tone/Strength: Functionally intact. No obvious neuro-muscular anomalies detected. Sensory (Neurological): Unimpaired Muscle strength & Tone: No palpable anomalies  Lumbar Spine Area Exam  Skin & Axial Inspection: No masses, redness, or swelling Alignment: Symmetrical Functional ROM: Unrestricted ROM       Stability: No instability detected Muscle Tone/Strength: Functionally intact. No obvious neuro-muscular anomalies detected. Sensory (Neurological): Unimpaired Palpation: No palpable anomalies       Provocative Tests: Hyperextension/rotation test: deferred today       Lumbar quadrant test (Kemp's test): deferred today       Lateral bending test: deferred today       Patrick's Maneuver: deferred today                   FABER test: deferred today                   S-I anterior distraction/compression test: deferred today         S-I lateral compression test: deferred today         S-I Thigh-thrust test: deferred today         S-I Gaenslen's test: deferred today          Gait & Posture Assessment  Ambulation: Unassisted Gait: Relatively normal for age and body habitus Posture: WNL   Lower Extremity Exam    Side: Right lower extremity  Side: Left lower extremity  Stability: No instability observed          Stability: No instability  observed          Skin & Extremity Inspection: Skin color, temperature, and hair growth are WNL. No peripheral edema or cyanosis. No masses, redness, swelling, asymmetry, or associated skin lesions. No contractures.  Skin & Extremity Inspection: Skin color, temperature, and hair growth are WNL. No peripheral edema or cyanosis. No masses, redness, swelling, asymmetry, or associated skin lesions. No contractures.  Functional ROM: Unrestricted ROM                  Functional ROM: Unrestricted ROM                  Muscle Tone/Strength: Functionally intact. No obvious neuro-muscular anomalies detected.  Muscle Tone/Strength: Functionally intact. No obvious neuro-muscular anomalies detected.  Sensory (Neurological): Unimpaired  Sensory (Neurological): Unimpaired  Palpation: No palpable anomalies  Palpation: No palpable anomalies   Assessment  Primary Diagnosis & Pertinent Problem List: There were no  encounter diagnoses.  Visit Diagnosis (New problems to examiner): No diagnosis found. Plan of Care (Initial workup plan)  Note: Please be advised that as per protocol, today's visit has been an evaluation only. We have not taken over the patient's controlled substance management.  Problem-specific plan: No problem-specific Assessment & Plan notes found for this encounter.  Ordered Lab-work, Procedure(s), Referral(s), & Consult(s): No orders of the defined types were placed in this encounter.  Pharmacotherapy (current): Medications ordered:  No orders of the defined types were placed in this encounter.  Medications administered during this visit: Lael R. Goris had no medications administered during this visit.   Pharmacological management options:  Opioid Analgesics: The patient was informed that there is no guarantee that she would be a candidate for opioid analgesics. The decision will be made following CDC guidelines. This decision will be based on the results of diagnostic studies, as well  as Ms. Gruenwald's risk profile.   Membrane stabilizer: To be determined at a later time  Muscle relaxant: To be determined at a later time  NSAID: To be determined at a later time  Other analgesic(s): To be determined at a later time   Interventional management options: Ms. Bouchillon was informed that there is no guarantee that she would be a candidate for interventional therapies. The decision will be based on the results of diagnostic studies, as well as Ms. Muzio's risk profile.  Procedure(s) under consideration:  ***   Provider-requested follow-up: No follow-ups on file.  Future Appointments  Date Time Provider [place]  09/15/2017  9:45 AM [facility]  10/31/2017 10:50 AM [facility] LAB [facility] None  11/07/2017 10:30 AM Higgs, Mathis Dad, MD [facility] None    Primary Care Physician: [name] Macadam, MD Location: [facility] Note by: [name] [name], M.D, Date: 09/04/2017; Time: 11:39 AM  There are no Patient Instructions on file for this visit.

## 2017-09-04 NOTE — Progress Notes (Signed)
Patient's Name: [name] [name]  MRN: [id number]  Referring Provider: Caren Macadam, MD  DOB: [birth year]  PCP: Caren Macadam, MD  DOS: 09/04/2017  Note by: Gillis Santa, MD  Service setting: Ambulatory outpatient  Specialty: Interventional Pain Management  Location: [facility] (AMB) Pain Management Facility  Visit type: Initial Patient Evaluation  Patient type: New Patient   Primary Reason(s) for Visit: Encounter for initial evaluation of one or more chronic problems (new to examiner) potentially causing chronic pain, and posing a threat to normal musculoskeletal function. (Level of risk: High) CC: Back Pain (lower)  HPI  [name] [name] is a 42 y.o. year old, female patient, who comes today to see [place] for the first time for an initial evaluation of her chronic pain. She has Pleuritic chest pain; SOB (shortness of breath); Hyperglycemia; Anemia; Tobacco use disorder; Saddle embolus of pulmonary artery with acute cor pulmonale ([place]); Secondary pulmonary hypertension; Obstructive sleep apnea; Back pain with left-sided radiculopathy; Iron deficiency anemia due to chronic blood loss; Benign essential hypertension; Morbid obesity ([place]); Fibroid, uterine; Abdominal cramps; History of pulmonary embolism; and Encounter for therapeutic drug monitoring on their problem list. Today she comes in for evaluation of her Back Pain (lower)  Pain Assessment: Location: Lower Back Radiating: down sides of both legs to ankles Onset: More than a month ago Duration: Chronic pain Quality: Constant, Throbbing, Sharp, Shooting Severity: 10-Worst pain ever/10 (subjective, self-reported pain score)  Note: Reported level is inconsistent with clinical observations. Clinically the patient looks like a 3/10 A 3/10 is viewed as "Moderate" and described as significantly interfering with activities of daily living (ADL). It becomes difficult to feed, bathe, get dressed, get on and off the toilet or to perform personal hygiene functions.  Difficult to get in and out of bed or a chair without assistance. Very distracting. With effort, it can be ignored when deeply involved in activities. Information on the proper use of the pain scale provided to the patient today. When using our objective Pain Scale, levels between 6 and 10/10 are said to belong in an emergency room, as it progressively worsens from a 6/10, described as severely limiting, requiring emergency care not usually available at an outpatient pain management facility. At a 6/10 level, communication becomes difficult and requires great effort. Assistance to reach the emergency department may be required. Facial flushing and profuse sweating along with potentially dangerous increases in heart rate and blood pressure will be evident. Effect on ADL: sometimes pt stays in bed for up to 2 days at a time because of pain; hard to care for 42 year old Timing: Constant Modifying factors: hot compresses, cold packs, meds BP: (!) 142/76  HR: (!) 155  Onset and Duration: Sudden Cause of pain: Unknown Severity: Getting worse Timing: Afternoon, Evening, During activity or exercise and After activity or exercise Aggravating Factors: Bending, Climbing, Kneeling, Lifiting, Motion, Prolonged sitting, Prolonged standing, Squatting, Stooping , Twisting, Walking, Walking uphill, Walking downhill and Working Alleviating Factors: Cold packs, Hot packs, Lying down, Medications and Resting Associated Problems: Numbness, Spasms, Sweating, Swelling, Tingling, Weakness, Pain that wakes patient up and Pain that does not allow patient to sleep Quality of Pain: Sharp, Shooting and Throbbing Previous Examinations or Tests: CT scan, Ct-Myelogram, MRI scan and Myelogram Previous Treatments: Narcotic medications  The patient comes into the clinics today for the first time for a chronic pain management evaluation.  42 year old female with a history of morbid obesity who presents with axial low back pain that  radiates down her  left side.  Patient states that she sustained a left ankle fracture in 2015 and then went on to have a DVT that progressed to a pulmonary embolus.  Patient describes worsening axial low back pain that radiates down her left hip and left thigh.  She also describes numbness and tingling in both hands and both feet.  Patient has been managed on oxycodone 10 mg twice daily however her providing physician is moving.  Patient has not participated in any recent physical therapy for her low back pain but has completed physical therapy for her left ankle after left ankle fracture.  Patient is currently on Coumadin.  Today I took the time to provide the patient with information regarding my pain practice. The patient was informed that my practice is divided into two sections: an interventional pain management section, as well as a completely separate and distinct medication management section. I explained that I have procedure days for my interventional therapies, and evaluation days for follow-ups and medication management. Because of the amount of documentation required during both, they are kept separated. This means that there is the possibility that she may be scheduled for a procedure on one day, and medication management the next. I have also informed her that because of staffing and facility limitations, I no longer take patients for medication management only. To illustrate the reasons for this, I gave the patient the example of surgeons, and how inappropriate it would be to refer a patient to his/her care, just to write for the post-surgical antibiotics on a surgery done by a different surgeon.   Because interventional pain management is my board-certified specialty, the patient was informed that joining my practice means that they are open to any and all interventional therapies. I made it clear that this does not mean that they will be forced to have any procedures done. What this means is  that I believe interventional therapies to be essential part of the diagnosis and proper management of chronic pain conditions. Therefore, patients not interested in these interventional alternatives will be better served under the care of a different practitioner.  The patient was also made aware of my Comprehensive Pain Management Safety Guidelines where by joining my practice, they limit all of their nerve blocks and joint injections to those done by our practice, for as long as we are retained to manage their care.   Historic Controlled Substance Pharmacotherapy Review  PMP and historical list of controlled substances: Oxycodone 10 mg twice daily as needed, quantity 30, last fill 08/26/2017 MME/day: 30 mg/day Medications: The patient did not bring the medication(s) to the appointment, as requested in our "New Patient Package" Pharmacodynamics: Desired effects: Analgesia: The patient reports >50% benefit. Reported improvement in function: The patient reports medication allows her to accomplish basic ADLs. Clinically meaningful improvement in function (CMIF): Sustained CMIF goals met Perceived effectiveness: Described as relatively effective, allowing for increase in activities of daily living (ADL) Undesirable effects: Side-effects or Adverse reactions: None reported Historical Monitoring: The patient  reports that she does not use drugs. List of all UDS Test(s): Lab Results  Component Value Date   COCAINSCRNUR Negative 07/21/2014   COCAINSCRNUR NONE DETECTED 04/27/2013   THCU NONE DETECTED 04/27/2013   ETH 94 (H) 04/27/2013   List of other Serum/Urine Drug Screening Test(s):  Lab Results  Component Value Date   COCAINSCRNUR Negative 07/21/2014   COCAINSCRNUR NONE DETECTED 04/27/2013   THCU NONE DETECTED 04/27/2013   ETH 94 (H) 04/27/2013   Historical  Background Evaluation: [place] PMP: Six (6) year initial data search conducted.             [place] Department of public safety, offender  search: Editor, commissioning Information) Non-contributory Risk Assessment Profile: Aberrant behavior: None observed or detected today Risk factors for fatal opioid overdose: None identified today Fatal overdose hazard ratio (HR): Calculation deferred Non-fatal overdose hazard ratio (HR): Calculation deferred Risk of opioid abuse or dependence: 0.7-3.0% with doses ? 36 MME/day and 6.1-26% with doses ? 120 MME/day. Substance use disorder (SUD) risk level: See below Personal History of Substance Abuse (SUD-Substance use disorder):  Alcohol: Negative  Illegal Drugs: Negative  Rx Drugs: Negative  ORT Risk Level calculation: Low Risk Opioid Risk Tool - 09/04/17 1128      Family History of Substance Abuse   Alcohol  Negative    Illegal Drugs  Negative    Rx Drugs  Negative      Personal History of Substance Abuse   Alcohol  Negative    Illegal Drugs  Negative    Rx Drugs  Negative      Age   Age between 6-45 years   Yes      History of Preadolescent Sexual Abuse   History of Preadolescent Sexual Abuse  Negative or Female      Psychological Disease   Psychological Disease  Negative    Depression  Negative      Total Score   Opioid Risk Tool Scoring  1    Opioid Risk Interpretation  Low Risk      ORT Scoring interpretation table:  Score <3 = Low Risk for SUD  Score between 4-7 = Moderate Risk for SUD  Score >8 = High Risk for Opioid Abuse   PHQ-2 Depression Scale:  Total score: 0  PHQ-2 Scoring interpretation table: (Score and probability of major depressive disorder)  Score 0 = No depression  Score 1 = 15.4% Probability  Score 2 = 21.1% Probability  Score 3 = 38.4% Probability  Score 4 = 45.5% Probability  Score 5 = 56.4% Probability  Score 6 = 78.6% Probability   PHQ-9 Depression Scale:  Total score: 0  PHQ-9 Scoring interpretation table:  Score 0-4 = No depression  Score 5-9 = Mild depression  Score 10-14 = Moderate depression  Score 15-19 = Moderately severe depression   Score 20-27 = Severe depression (2.4 times higher risk of SUD and 2.89 times higher risk of overuse)   Pharmacologic Plan: As per protocol, I have not taken over any controlled substance management, pending the results of ordered tests and/or consults.            Initial impression: Pending review of available data and ordered tests.  Meds   Current Outpatient Medications:  .  amLODipine (NORVASC) 10 MG tablet, Take 1 tablet (10 mg total) by mouth daily., Disp: 90 tablet, Rfl: 3 .  lisinopril (PRINIVIL,ZESTRIL) 40 MG tablet, Take 1 tablet (40 mg total) by mouth daily., Disp: 90 tablet, Rfl: 3 .  norethindrone (MICRONOR,CAMILA,ERRIN) 0.35 MG tablet, TAKE ONE TABLET BY MOUTH ONCE DAILY, Disp: 28 tablet, Rfl: 3 .  Oxycodone HCl 10 MG TABS, Take one tablet every 8 hours as needed for uncontrolled pain, Disp: 15 tablet, Rfl: 0 .  triamterene-hydrochlorothiazide (MAXZIDE-25) 37.5-25 MG tablet, TAKE 1 TABLET BY MOUTH ONCE A DAY., Disp: 30 tablet, Rfl: 3 .  warfarin (COUMADIN) 1 MG tablet, Take 1 tablet (1 mg total) by mouth daily., Disp: 30 tablet, Rfl: 3 .  warfarin (COUMADIN) 10 MG tablet, Take 1 tablet (10 mg total) by mouth daily., Disp: 30 tablet, Rfl: 3 .  warfarin (COUMADIN) 2.5 MG tablet, Take 1 tablet (2.5 mg total) by mouth daily., Disp: 30 tablet, Rfl: 3 .  diclofenac (VOLTAREN) 75 MG EC tablet, Take 1 tablet (75 mg total) by mouth 2 (two) times daily., Disp: 60 tablet, Rfl: 0 .  pregabalin (LYRICA) 50 MG capsule, 50 mg qhs for 2 weeks then 50 mg BID, Disp: 90 capsule, Rfl: 0 No current facility-administered medications for this visit.   Facility-Administered Medications Ordered in Other Visits:  .  0.9 %  sodium chloride infusion, , Intravenous, Continuous, Kefalas, Manon Hilding, PA-C, Last Rate: 20 mL/hr at 12/22/15 1430  Imaging Review    Results for orders placed during the hospital encounter of 08/14/15  CT Lumbar Spine Wo Contrast   Narrative CLINICAL DATA:  Chronic low back pain  extending at the right lower extremity. Symptoms have progressed since yesterday. The patient is unable to undergo MRI. EXAM: CT LUMBAR SPINE WITHOUT CONTRAST TECHNIQUE: Multidetector CT imaging of the lumbar spine was performed without intravenous contrast administration. Multiplanar CT image reconstructions were also generated. COMPARISON:  None. FINDINGS: Five non rib-bearing lumbar type vertebral bodies are present. Mild focal leftward curvature of the lumbar spine is centered at L4-5. There slight anterolisthesis at L4-5 as well measuring 5 mm. Alignment is otherwise anatomic. Vertebral body heights are intact. An IVC filter is in place. Limited imaging of the abdomen is otherwise unremarkable. Chronic bone changes are associated sacroiliitis bilaterally. L1-2: Mild facet hypertrophy is present bilaterally without significant stenosis. L2-3: Mild facet hypertrophy is present. There is no significant stenosis. L3-4: A broad-based disc protrusion is asymmetric to the left moderate facet hypertrophy and ligamentum flavum thickening is present. Mild central canal stenosis and moderate foraminal narrowing is present bilaterally, worse on the left. L4-5: A broad-based disc protrusion is present. Advanced facet hypertrophy is noted bilaterally. A vacuum phenomenon is present in the right facet. Severe central canal stenosis is present. Severe right and moderate left foraminal narrowing is evident. L5-S1: A central disc protrusion is present. Mild facet hypertrophy is noted bilaterally. This results an mild central and right foraminal narrowing. IMPRESSION: 1. Advanced facet hypertrophy results and 5 mm grade 1 anterolisthesis at L4-5. 2. There is uncovering of a broad-based disc protrusion at L4-5 with severe central canal stenosis, severe right, and moderate left foraminal narrowing. 3. Mild facet hypertrophy at L1-2 and L2-3 without significant stenosis at these levels. 4.  Mild central and moderate bilateral foraminal stenosis at L3-4. 5. Mild central and right foraminal stenosis at L5-S1. Electronically Signed   By: San Morelle M.D.   On: 08/14/2015 17:05    Lumbar CT w contrast:  Results for orders placed during the hospital encounter of 01/20/17  CT LUMBAR SPINE W CONTRAST   Narrative CLINICAL DATA:  Right low back and hip pain occasionally radiating into the right leg.  EXAM: LUMBAR MYELOGRAM  FLUOROSCOPY TIME:  Radiation Exposure Index (as provided by the fluoroscopic device): 5601.00 microGray*m^2  Fluoroscopy Time (in minutes and seconds):  2 minutes 24 seconds  PROCEDURE: Dr. Christella Noa consented the patient and initially attempted a lumbar puncture without return of CSF. I then re-prepped and draped the patient in the usual sterile fashion and provided additional local anesthesia with 1% lidocaine. I performed a lumbar puncture using a 7 inch 22 gauge spinal needle via a right paramedian approach at L2-3. Using a single  pass through the dura, the needle was placed within the thecal sac, with return of clear CSF. 15 mL of Isovue M-200 was injected into the thecal sac, with normal opacification of the nerve roots and cauda equina consistent with free flow within the subarachnoid space.  I personally performed the lumbar puncture and administered the intrathecal contrast. I also personally supervised acquisition of the myelogram images.  TECHNIQUE: Contiguous axial images were obtained through the Lumbar spine after the intrathecal infusion of infusion. Coronal and sagittal reconstructions were obtained of the axial image sets.  COMPARISON:  Noncontrast lumbar spine CT 08/14/2015  FINDINGS: LUMBAR MYELOGRAM FINDINGS:  There are 5 non rib-bearing lumbar type vertebrae. There is grade 1 anterolisthesis of L4 on L5 which increases with standing and flexion. Associated spinal stenosis at this level appears severe with standing.  There is a small ventral extradural defect at L3-4 which does not significantly increase with standing and does not result in high-grade stenosis. The thecal sac appears widely patent elsewhere. An IVC filter is noted.  CT LUMBAR MYELOGRAM FINDINGS:  Grade 1 anterolisthesis of L4 on L5 in the supine position measures 4 mm, unchanged from the prior CT. There is also trace retrolisthesis of L3 on L4. Mild depression of the L4 superior endplate on the left is unchanged. No acute fracture or destructive osseous process is identified. The conus medullaris terminates at L1.  Chronic sacroiliitis is noted with associated sclerosis on both sides of the joint. An IVC filter is in place below the level the renal veins. Partially visualized uterine enlargement compatible with a fibroid described on prior ultrasound.  T12-L1: Mild facet arthrosis without disc herniation or stenosis.  L1-2:  Mild facet arthrosis without disc herniation or stenosis.  L2-3: Mild-to-moderate facet arthrosis without disc herniation or stenosis.  L3-4: Circumferential disc bulging, left foraminal disc osteophyte complex, and mild-to-moderate facet and ligamentum flavum hypertrophy result in borderline spinal stenosis and mild right and moderate left neural foraminal stenosis, similar to the prior CT.  L4-5: Mild disc space narrowing. Anterolisthesis, bulging uncovered disc, right foraminal/ extraforaminal disc protrusion, congenitally short pedicles, and severe facet arthrosis result in moderate spinal stenosis and severe right and mild-to-moderate left neural foraminal stenosis, similar to prior. Potential right L4 nerve impingement in and lateral to the foramen. Widened facet joints containing gas consistent with instability demonstrated on upright radiographs.  L5-S1: Mild disc space narrowing. Mild disc bulging and facet hypertrophy result in borderline to mild right greater than left neural foraminal  stenosis without spinal stenosis, unchanged.  IMPRESSION: 1. Severe facet arthrosis at L4-5 with grade 1 anterolisthesis which increases with standing and results in severe spinal stenosis. Severe right neural foraminal stenosis as well. 2. Borderline spinal stenosis and mild right and moderate left foraminal stenosis at L3-4. 3. Mild foraminal narrowing at L5-S1.   Electronically Signed   By: Logan Bores M.D.   On: 01/20/2017 15:39     Lumbar DG (Complete) 4+V:  Results for orders placed during the hospital encounter of 10/23/16  DG Lumbar Spine Complete   Narrative CLINICAL DATA:  Low back pain radiating to the left leg  EXAM: LUMBAR SPINE - COMPLETE 4+ VIEW  COMPARISON:  Lumbar spine CT 08/14/2015  FINDINGS: Unchanged grade 1 anterolisthesis at L4-L5 due to facet arthrosis. Vertebral body heights are preserved. Mild narrowing of the intervertebral disc space at L4-5 and L5-S1. Lower lumbar facet hypertrophy at L4-L5 and L5-S1. There is an IVC filter.  IMPRESSION: Unchanged grade 1 anterolisthesis  at L4-L5. No acute abnormality of the lumbar spine.   Electronically Signed   By: Ulyses Jarred M.D.   On: 10/23/2016 13:58    Results for orders placed during the hospital encounter of 01/20/17  DG [place]   Narrative CLINICAL DATA:  Right low back and hip pain occasionally radiating into the right leg.  EXAM: LUMBAR MYELOGRAM  FLUOROSCOPY TIME:  Radiation Exposure Index (as provided by the fluoroscopic device): 5601.00 microGray*m^2  Fluoroscopy Time (in minutes and seconds):  2 minutes 24 seconds  PROCEDURE: Dr. Christella Noa consented the patient and initially attempted a lumbar puncture without return of CSF. I then re-prepped and draped the patient in the usual sterile fashion and provided additional local anesthesia with 1% lidocaine. I performed a lumbar puncture using a 7 inch 22 gauge spinal needle via a right paramedian approach  at L2-3. Using a single pass through the dura, the needle was placed within the thecal sac, with return of clear CSF. 15 mL of Isovue M-200 was injected into the thecal sac, with normal opacification of the nerve roots and cauda equina consistent with free flow within the subarachnoid space.  I personally performed the lumbar puncture and administered the intrathecal contrast. I also personally supervised acquisition of the myelogram images.  TECHNIQUE: Contiguous axial images were obtained through the Lumbar spine after the intrathecal infusion of infusion. Coronal and sagittal reconstructions were obtained of the axial image sets.  COMPARISON:  Noncontrast lumbar spine CT 08/14/2015  FINDINGS: LUMBAR MYELOGRAM FINDINGS:  There are 5 non rib-bearing lumbar type vertebrae. There is grade 1 anterolisthesis of L4 on L5 which increases with standing and flexion. Associated spinal stenosis at this level appears severe with standing. There is a small ventral extradural defect at L3-4 which does not significantly increase with standing and does not result in high-grade stenosis. The thecal sac appears widely patent elsewhere. An IVC filter is noted.  CT LUMBAR MYELOGRAM FINDINGS:  Grade 1 anterolisthesis of L4 on L5 in the supine position measures 4 mm, unchanged from the prior CT. There is also trace retrolisthesis of L3 on L4. Mild depression of the L4 superior endplate on the left is unchanged. No acute fracture or destructive osseous process is identified. The conus medullaris terminates at L1.  Chronic sacroiliitis is noted with associated sclerosis on both sides of the joint. An IVC filter is in place below the level the renal veins. Partially visualized uterine enlargement compatible with a fibroid described on prior ultrasound.  T12-L1: Mild facet arthrosis without disc herniation or stenosis.  L1-2:  Mild facet arthrosis without disc herniation or stenosis.  L2-3:  Mild-to-moderate facet arthrosis without disc herniation or stenosis.  L3-4: Circumferential disc bulging, left foraminal disc osteophyte complex, and mild-to-moderate facet and ligamentum flavum hypertrophy result in borderline spinal stenosis and mild right and moderate left neural foraminal stenosis, similar to the prior CT.  L4-5: Mild disc space narrowing. Anterolisthesis, bulging uncovered disc, right foraminal/ extraforaminal disc protrusion, congenitally short pedicles, and severe facet arthrosis result in moderate spinal stenosis and severe right and mild-to-moderate left neural foraminal stenosis, similar to prior. Potential right L4 nerve impingement in and lateral to the foramen. Widened facet joints containing gas consistent with instability demonstrated on upright radiographs.  L5-S1: Mild disc space narrowing. Mild disc bulging and facet hypertrophy result in borderline to mild right greater than left neural foraminal stenosis without spinal stenosis, unchanged.  IMPRESSION: 1. Severe facet arthrosis at L4-5 with grade 1 anterolisthesis  which increases with standing and results in severe spinal stenosis. Severe right neural foraminal stenosis as well. 2. Borderline spinal stenosis and mild right and moderate left foraminal stenosis at L3-4. 3. Mild foraminal narrowing at L5-S1.   Electronically Signed   By: Logan Bores M.D.   On: 01/20/2017 15:39     Ankle-L DG Complete:  Results for orders placed during the hospital encounter of 09/25/13  DG Ankle Complete Left   Narrative CLINICAL DATA:  Fall with twisting injury of left ankle and diffuse soft tissue swelling.  EXAM: LEFT ANKLE COMPLETE - 3+ VIEW  COMPARISON:  None.  FINDINGS: Diffuse soft tissue swelling is identified surrounding the ankle and proximal foot. In the lateral projection, there are avulsion fractures involving the neck of the talus and the superior surface of the navicular bone. The  talar avulsion shows single mildly displaced fragment. The superior navicular avulsions show mild displacement of several cortical avulsive fragments.  The ankle mortise shows normal alignment. No evidence of ankle fracture.  IMPRESSION: Acute avulsive injuries involving the neck of the talus and superior surface of the navicular bone with mild displacement of several cortical avulsive fragments. Diffuse soft tissue swelling is present.   Electronically Signed   By: Aletta Edouard M.D.   On: 09/25/2013 12:20     Complexity Note: Imaging results reviewed. Results shared with Ms. Ducat, using Layman's terms.                         ROS  Cardiovascular: High blood pressure, Chest pain and Blood thinners:  Anticoagulant COUMADIN FOR SADDLE PE Hx Pulmonary or Respiratory: Shortness of breath, Smoking and Snoring  Neurological: No reported neurological signs or symptoms such as seizures, abnormal skin sensations, urinary and/or fecal incontinence, being born with an abnormal open spine and/or a tethered spinal cord Review of Past Neurological Studies: No results found for this or any previous visit. Psychological-Psychiatric: No reported psychological or psychiatric signs or symptoms such as difficulty sleeping, anxiety, depression, delusions or hallucinations (schizophrenial), mood swings (bipolar disorders) or suicidal ideations or attempts Gastrointestinal: No reported gastrointestinal signs or symptoms such as vomiting or evacuating blood, reflux, heartburn, alternating episodes of diarrhea and constipation, inflamed or scarred liver, or pancreas or irrregular and/or infrequent bowel movements Genitourinary: No reported renal or genitourinary signs or symptoms such as difficulty voiding or producing urine, peeing blood, non-functioning kidney, kidney stones, difficulty emptying the bladder, difficulty controlling the flow of urine, or chronic kidney disease Hematological: No reported  hematological signs or symptoms such as prolonged bleeding, low or poor functioning platelets, bruising or bleeding easily, hereditary bleeding problems, low energy levels due to low hemoglobin or being anemic Endocrine: No reported endocrine signs or symptoms such as high or low blood sugar, rapid heart rate due to high thyroid levels, obesity or weight gain due to slow thyroid or thyroid disease Rheumatologic: No reported rheumatological signs and symptoms such as fatigue, joint pain, tenderness, swelling, redness, heat, stiffness, decreased range of motion, with or without associated rash Musculoskeletal: Negative for myasthenia gravis, muscular dystrophy, multiple sclerosis or malignant hyperthermia Work History: Out of work due to pain  Allergies  Ms. Dumler is allergic to sulfa antibiotics.  Laboratory Chemistry  Inflammation Markers (CRP: Acute Phase) (ESR: Chronic Phase) Lab Results  Component Value Date   ESRSEDRATE 25 (H) 10/28/2013   LATICACIDVEN 6.2 (H) 04/27/2013  Rheumatology Markers No results found for: RF, ANA, LABURIC, URICUR, LYMEIGGIGMAB, LYMEABIGMQN, HLAB27                      Renal Function Markers Lab Results  Component Value Date   BUN 13 07/31/2017   CREATININE 0.82 18/84/1660   BCR NOT APPLICABLE 63/01/6008   GFRAA >60 07/31/2017   GFRNONAA >60 07/31/2017                             Hepatic Function Markers Lab Results  Component Value Date   AST 15 07/31/2017   ALT 14 07/31/2017   ALBUMIN 3.4 (L) 07/31/2017   ALKPHOS 51 07/31/2017                        Electrolytes Lab Results  Component Value Date   NA 142 07/31/2017   K 4.3 07/31/2017   CL 112 (H) 07/31/2017   CALCIUM 8.7 (L) 07/31/2017                        Neuropathy Markers Lab Results  Component Value Date   VITAMINB12 307 05/19/2014   FOLATE 8.0 05/19/2014   HGBA1C 5.3 01/31/2017   HIV NON-REACTIVE 01/31/2017                        Bone Pathology  Markers No results found for: VD25OH, VD125OH2TOT, XN2355DD2, KG2542HC6, 25OHVITD1, 25OHVITD2, 25OHVITD3, TESTOFREE, TESTOSTERONE                       Coagulation Parameters Lab Results  Component Value Date   INR 1.3 (A) 09/03/2017   LABPROT 31.1 (H) 07/31/2017   APTT 54 (H) 02/17/2015   PLT 269 07/31/2017   DDIMER 0.76 (H) 01/27/2014                        Cardiovascular Markers Lab Results  Component Value Date   TROPONINI <0.30 04/27/2013   HGB 10.7 (L) 07/31/2017   HCT 33.4 (L) 07/31/2017                         CA Markers No results found for: CEA, CA125, LABCA2                      Note: Lab results reviewed.  PFSH  Drug: Ms. Sandefur  reports that she does not use drugs. Alcohol:  reports that she drinks about 2.0 standard drinks of alcohol per week. Tobacco:  reports that she has been smoking cigarettes. She has a 4.50 pack-year smoking history. She has never used smokeless tobacco. Medical:  has a past medical history of Abdominal cramps (08/30/2015), Anemia, Blood transfusion without reported diagnosis, Closed left ankle fracture, Clotting disorder ([place]), DVT (deep venous thrombosis) ([place]), Fibroid tumor, Hidradenitis, High blood pressure, Hypertension, Obesity, OSA on CPAP, Oxygen deficiency, Pregnancy, Pulmonary embolism ([place]) (04/2013), Sleep apnea, and Tobacco use disorder. Family: family history includes Cancer in her maternal grandmother; Diabetes in her paternal aunt and paternal uncle; Heart attack in her paternal grandmother; Hypertension in her mother; Other in her father.  Past Surgical History:  Procedure Laterality Date  . ABSCESS DRAINAGE Bilateral 01/10/15  . CYST REMOVAL TRUNK    . IVC FILTER PLACEMENT ([facility] HX)  Active Ambulatory Problems    Diagnosis Date Noted  . Pleuritic chest pain 04/27/2013  . SOB (shortness of breath) 04/27/2013  . Hyperglycemia 04/27/2013  . Anemia 04/27/2013  . Tobacco use disorder 04/27/2013  . Saddle embolus of  pulmonary artery with acute cor pulmonale ([place]) 04/27/2013  . Secondary pulmonary hypertension 07/06/2013  . Obstructive sleep apnea 07/06/2013  . Back pain with left-sided radiculopathy 10/31/2013  . Iron deficiency anemia due to chronic blood loss 02/05/2014  . Benign essential hypertension 07/21/2014  . Morbid obesity ([place]) 08/18/2014  . Fibroid, uterine 08/18/2014  . Abdominal cramps 08/30/2015  . History of pulmonary embolism 08/27/2017  . Encounter for therapeutic drug monitoring 09/03/2017   Resolved Ambulatory Problems    Diagnosis Date Noted  . PE (pulmonary embolism) 04/27/2013  . Pulmonary embolism ([place]) 04/21/2013   Past Medical History:  Diagnosis Date  . Blood transfusion without reported diagnosis   . Closed left ankle fracture   . Clotting disorder ([place])   . DVT (deep venous thrombosis) ([place])   . Fibroid tumor   . Hidradenitis   . High blood pressure   . Hypertension   . Obesity   . OSA on CPAP   . Oxygen deficiency   . Pregnancy   . Sleep apnea    Constitutional Exam  General appearance: Well nourished, well developed, and well hydrated. In no apparent acute distress Vitals:   09/04/17 1118  BP: (!) 142/76  Pulse: (!) 155  Resp: 18  Temp: 98.2 F (36.8 C)  TempSrc: Oral  SpO2: 98%  Weight: (!) 361 lb (163.7 kg)  Height: 5' 10"  (1.778 m)   BMI Assessment: Estimated body mass index is 51.8 kg/m as calculated from the following:   Height as of this encounter: 5' 10"  (1.778 m).   Weight as of this encounter: 361 lb (163.7 kg).  BMI interpretation table: BMI level Category Range association with higher incidence of chronic pain  <18 kg/m2 Underweight   18.5-24.9 kg/m2 Ideal body weight   25-29.9 kg/m2 Overweight Increased incidence by 20%  30-34.9 kg/m2 Obese (Class I) Increased incidence by 68%  35-39.9 kg/m2 Severe obesity (Class II) Increased incidence by 136%  >40 kg/m2 Extreme obesity (Class III) Increased incidence by 254%   Patient's  current BMI Ideal Body weight  Body mass index is 51.8 kg/m. Ideal body weight: 68.5 kg (151 lb 0.2 oz) Adjusted ideal body weight: 106.6 kg (235 lb 0.2 oz)   BMI Readings from Last 4 Encounters:  09/04/17 51.80 kg/m  08/27/17 52.23 kg/m  07/31/17 53.29 kg/m  06/12/17 51.95 kg/m   Wt Readings from Last 4 Encounters:  09/04/17 (!) 361 lb (163.7 kg)  08/27/17 (!) 364 lb (165.1 kg)  07/31/17 (!) 371 lb 6.4 oz (168.5 kg)  06/12/17 (!) 362 lb 0.6 oz (164.2 kg)  Psych/Mental status: Alert, oriented x 3 (person, place, & time)       Eyes: PERLA Respiratory: No evidence of acute respiratory distress  Cervical Spine Area Exam  Skin & Axial Inspection: No masses, redness, edema, swelling, or associated skin lesions Alignment: Symmetrical Functional ROM: Unrestricted ROM      Stability: No instability detected Muscle Tone/Strength: Functionally intact. No obvious neuro-muscular anomalies detected. Sensory (Neurological): Unimpaired Palpation: No palpable anomalies              Upper Extremity (UE) Exam    Side: Right upper extremity  Side: Left upper extremity  Skin & Extremity Inspection: Skin color, temperature, and hair  growth are WNL. No peripheral edema or cyanosis. No masses, redness, swelling, asymmetry, or associated skin lesions. No contractures.  Skin & Extremity Inspection: Skin color, temperature, and hair growth are WNL. No peripheral edema or cyanosis. No masses, redness, swelling, asymmetry, or associated skin lesions. No contractures.  Functional ROM: Unrestricted ROM          Functional ROM: Unrestricted ROM          Muscle Tone/Strength: Functionally intact. No obvious neuro-muscular anomalies detected.  Muscle Tone/Strength: Functionally intact. No obvious neuro-muscular anomalies detected.  Sensory (Neurological): Unimpaired          Sensory (Neurological): Unimpaired          Palpation: No palpable anomalies              Palpation: No palpable anomalies               Provocative Test(s):  Phalen's test: deferred Tinel's test: deferred Apley's scratch test (touch opposite shoulder):  Action 1 (Across chest): deferred Action 2 (Overhead): deferred Action 3 (LB reach): deferred   Provocative Test(s):  Phalen's test: deferred Tinel's test: deferred Apley's scratch test (touch opposite shoulder):  Action 1 (Across chest): deferred Action 2 (Overhead): deferred Action 3 (LB reach): deferred    Thoracic Spine Area Exam  Skin & Axial Inspection: No masses, redness, or swelling Alignment: Symmetrical Functional ROM: Unrestricted ROM Stability: No instability detected Muscle Tone/Strength: Functionally intact. No obvious neuro-muscular anomalies detected. Sensory (Neurological): Unimpaired Muscle strength & Tone: No palpable anomalies  Lumbar Spine Area Exam  Skin & Axial Inspection: No masses, redness, or swelling Alignment: Symmetrical Functional ROM: Decreased ROM       Stability: No instability detected Muscle Tone/Strength: Functionally intact. No obvious neuro-muscular anomalies detected. Sensory (Neurological): Dermatomal pain pattern and musculoskeletal Palpation: No palpable anomalies       Provocative Tests: Hyperextension/rotation test: (+) bilaterally for facet joint pain. Lumbar quadrant test (Kemp's test): (+) bilaterally for facet joint pain. Lateral bending test: (+) ipsilateral radicular pain, on the left. Positive for left-sided foraminal stenosis. Patrick's Maneuver: Unable to perform                   FABER test: Unable to perform                   S-I anterior distraction/compression test: deferred today         S-I lateral compression test: deferred today         S-I Thigh-thrust test: deferred today         S-I Gaenslen's test: deferred today          Gait & Posture Assessment  Ambulation: Unassisted Gait: Relatively normal for age and body habitus Posture: WNL   Lower Extremity Exam    Side: Right lower extremity   Side: Left lower extremity  Stability: No instability observed          Stability: No instability observed          Skin & Extremity Inspection: Skin color, temperature, and hair growth are WNL. No peripheral edema or cyanosis. No masses, redness, swelling, asymmetry, or associated skin lesions. No contractures.  Skin & Extremity Inspection: Skin color, temperature, and hair growth are WNL. No peripheral edema or cyanosis. No masses, redness, swelling, asymmetry, or associated skin lesions. No contractures.  Functional ROM: Unrestricted ROM                  Functional ROM: Decreased  ROM for all joints of the lower extremity          Muscle Tone/Strength: Functionally intact. No obvious neuro-muscular anomalies detected.  Muscle Tone/Strength: Functionally intact. No obvious neuro-muscular anomalies detected.  Sensory (Neurological): Unimpaired  Sensory (Neurological): Dermatomal pain pattern  Palpation: No palpable anomalies  Palpation: No palpable anomalies   Assessment  Primary Diagnosis & Pertinent Problem List: The primary encounter diagnosis was Lumbar spondylosis. Diagnoses of Lumbar facet arthropathy, Lumbar degenerative disc disease, Lumbar foraminal stenosis, Morbid obesity ([place]), History of pulmonary embolism, Acute saddle pulmonary embolism with acute cor pulmonale ([place]), and Chronic pain syndrome were also pertinent to this visit.  Visit Diagnosis (New problems to examiner): 1. Lumbar spondylosis   2. Lumbar facet arthropathy   3. Lumbar degenerative disc disease   4. Lumbar foraminal stenosis   5. Morbid obesity ([place])   6. History of pulmonary embolism   7. Acute saddle pulmonary embolism with acute cor pulmonale ([facility])   8. Chronic pain syndrome    42 year old female with a history of morbid obesity who presents with axial low back pain that radiates down her left side.  Patient states that she sustained a left ankle fracture in 2015 and then went on to have a DVT that progressed  to a pulmonary embolus.  Patient describes worsening axial low back pain that radiates down her left hip and left thigh.  She also describes numbness and tingling in both hands and both feet.  Patient has been managed on oxycodone 10 mg twice daily however her providing physician is moving.  Patient has not participated in any recent physical therapy for her low back pain but has completed physical therapy for her left ankle after left ankle fracture.  Patient is currently on Coumadin.  Regards to treatment options, I recommend physical therapy and possibly aquatic therapy to help out with lumbar paraspinal muscle strengthening and range of motion.  For her neuropathic pain symptoms secondary to left foraminal stenosis and radiculopathy, we discussed Lyrica 50 mill grams nightly for 2 weeks then increase to 50 mill grams twice daily.  I will also prescribe diclofenac (anti-inflammatory, 75 mg twice daily to be taken after meal (patient's creatinine was 0.82).   I would like to avoid opioid therapy if possible however will complete a urine drug screen if we need to continue the patient's oxycodone 10 mg twice daily as needed for moderate to severe pain.  Plan: -Physical therapy possible aquatic therapy -Urine drug screen today.  Should be positive for oxycodone and its metabolites -Lyrica prescription as below -Diclofenac prescription as below -Not a procedural candidate given that patient is on Coumadin for history of saddle PE and is too high risk to be off of Coumadin  Future considerations: Gabapentin, Topamax, TCA, Cymbalta, muscle relaxant  Plan of Care (Initial workup plan)  Note: Please be advised that as per protocol, today's visit has been an evaluation only. We have not taken over the patient's controlled substance management.  Problem-specific plan: No problem-specific Assessment & Plan notes found for this encounter.  Ordered Lab-work, Procedure(s), Referral(s), & Consult(s): Orders  Placed This Encounter  Procedures  . Compliance Drug Analysis, Ur  . Ambulatory referral to Physical Therapy   Pharmacotherapy (current): Medications ordered:  Meds ordered this encounter  Medications  . pregabalin (LYRICA) 50 MG capsule    Sig: 50 mg qhs for 2 weeks then 50 mg BID    Dispense:  90 capsule    Refill:  0  Do not place this medication, or any other prescription from our practice, on "Automatic Refill". Patient may have prescription filled one day early if pharmacy is closed on scheduled refill date.  . diclofenac (VOLTAREN) 75 MG EC tablet    Sig: Take 1 tablet (75 mg total) by mouth 2 (two) times daily.    Dispense:  60 tablet    Refill:  0   Medications administered during this visit: Keira R. Igarashi had no medications administered during this visit.    Provider-requested follow-up: Return in about 4 weeks (around 10/02/2017) for Medication Management.  Future Appointments  Date Time Provider [place]  09/15/2017  9:45 AM [facility] [place] [facility]  10/02/2017 11:30 AM Gillis Santa, MD [facility] None  10/31/2017 10:50 AM [facility] None  11/07/2017 10:30 AM Higgs, Mathis Dad, MD [facility] None    Primary Care Physician: Caren Macadam, MD Location: [facility] Note by: Gillis Santa, M.D, Date: 09/04/2017; Time: 2:17 PM  There are no Patient Instructions on file for this visit.

## 2017-09-04 NOTE — Progress Notes (Signed)
Safety precautions to be maintained throughout the outpatient stay will include: orient to surroundings, keep bed in low position, maintain call bell within reach at all times, provide assistance with transfer out of bed and ambulation.  

## 2017-09-08 ENCOUNTER — Telehealth: Payer: Self-pay | Admitting: Student in an Organized Health Care Education/Training Program

## 2017-09-08 NOTE — Telephone Encounter (Signed)
Patient notified that a PA was sent last Friday and it is still pending.

## 2017-09-08 NOTE — Telephone Encounter (Signed)
Patient has medicaid and will not cover meds until authorized from here, she asks if they will not authorize please check into different meds. Please let patient know status of meds. Her next appt is 9-12 Patient # [phone]

## 2017-09-10 LAB — COMPLIANCE DRUG ANALYSIS, UR

## 2017-09-15 ENCOUNTER — Ambulatory Visit (INDEPENDENT_AMBULATORY_CARE_PROVIDER_SITE_OTHER): Payer: Medicaid Other | Admitting: *Deleted

## 2017-09-15 DIAGNOSIS — Z5181 Encounter for therapeutic drug level monitoring: Secondary | ICD-10-CM

## 2017-09-15 DIAGNOSIS — Z86711 Personal history of pulmonary embolism: Secondary | ICD-10-CM

## 2017-09-15 LAB — POCT INR: INR: 1.5 — AB (ref 2.0–3.0)

## 2017-09-15 NOTE — Patient Instructions (Signed)
Take coumadin 20mg  tonight then increase dose to 15mg  daily Recheck in 1 week

## 2017-10-02 ENCOUNTER — Encounter: Payer: Self-pay | Admitting: Student in an Organized Health Care Education/Training Program

## 2017-10-02 ENCOUNTER — Ambulatory Visit
Payer: Medicaid Other | Attending: Student in an Organized Health Care Education/Training Program | Admitting: Student in an Organized Health Care Education/Training Program

## 2017-10-02 ENCOUNTER — Other Ambulatory Visit: Payer: Self-pay

## 2017-10-02 VITALS — BP 140/76 | HR 79 | Temp 98.5°F | Resp 16 | Ht 70.0 in | Wt 361.0 lb

## 2017-10-02 DIAGNOSIS — M5136 Other intervertebral disc degeneration, lumbar region: Secondary | ICD-10-CM | POA: Diagnosis not present

## 2017-10-02 DIAGNOSIS — M4726 Other spondylosis with radiculopathy, lumbar region: Secondary | ICD-10-CM | POA: Diagnosis not present

## 2017-10-02 DIAGNOSIS — Z5181 Encounter for therapeutic drug level monitoring: Secondary | ICD-10-CM | POA: Diagnosis not present

## 2017-10-02 DIAGNOSIS — I10 Essential (primary) hypertension: Secondary | ICD-10-CM | POA: Insufficient documentation

## 2017-10-02 DIAGNOSIS — Z86711 Personal history of pulmonary embolism: Secondary | ICD-10-CM | POA: Diagnosis not present

## 2017-10-02 DIAGNOSIS — M47816 Spondylosis without myelopathy or radiculopathy, lumbar region: Secondary | ICD-10-CM

## 2017-10-02 DIAGNOSIS — G4733 Obstructive sleep apnea (adult) (pediatric): Secondary | ICD-10-CM | POA: Insufficient documentation

## 2017-10-02 DIAGNOSIS — M9983 Other biomechanical lesions of lumbar region: Secondary | ICD-10-CM

## 2017-10-02 DIAGNOSIS — Z8249 Family history of ischemic heart disease and other diseases of the circulatory system: Secondary | ICD-10-CM | POA: Insufficient documentation

## 2017-10-02 DIAGNOSIS — M48061 Spinal stenosis, lumbar region without neurogenic claudication: Secondary | ICD-10-CM | POA: Insufficient documentation

## 2017-10-02 DIAGNOSIS — Z79899 Other long term (current) drug therapy: Secondary | ICD-10-CM | POA: Insufficient documentation

## 2017-10-02 DIAGNOSIS — M1288 Other specific arthropathies, not elsewhere classified, other specified site: Secondary | ICD-10-CM | POA: Insufficient documentation

## 2017-10-02 DIAGNOSIS — M5116 Intervertebral disc disorders with radiculopathy, lumbar region: Secondary | ICD-10-CM | POA: Diagnosis not present

## 2017-10-02 DIAGNOSIS — F17210 Nicotine dependence, cigarettes, uncomplicated: Secondary | ICD-10-CM | POA: Insufficient documentation

## 2017-10-02 DIAGNOSIS — M545 Low back pain: Secondary | ICD-10-CM | POA: Insufficient documentation

## 2017-10-02 DIAGNOSIS — I2602 Saddle embolus of pulmonary artery with acute cor pulmonale: Secondary | ICD-10-CM | POA: Insufficient documentation

## 2017-10-02 DIAGNOSIS — Z7901 Long term (current) use of anticoagulants: Secondary | ICD-10-CM | POA: Diagnosis not present

## 2017-10-02 DIAGNOSIS — Z6841 Body Mass Index (BMI) 40.0 and over, adult: Secondary | ICD-10-CM | POA: Diagnosis not present

## 2017-10-02 DIAGNOSIS — Z882 Allergy status to sulfonamides status: Secondary | ICD-10-CM | POA: Diagnosis not present

## 2017-10-02 DIAGNOSIS — G894 Chronic pain syndrome: Secondary | ICD-10-CM | POA: Insufficient documentation

## 2017-10-02 MED ORDER — OXYCODONE-ACETAMINOPHEN 10-325 MG PO TABS: 1.0000 | ORAL_TABLET | Freq: Two times a day (BID) | ORAL | 0 refills | Status: DC | PRN

## 2017-10-02 MED ORDER — DULOXETINE HCL 30 MG PO CPEP: ORAL_CAPSULE | ORAL | 0 refills | Status: DC

## 2017-10-02 NOTE — Progress Notes (Signed)
Safety precautions to be maintained throughout the outpatient stay will include: orient to surroundings, keep bed in low position, maintain call bell within reach at all times, provide assistance with transfer out of bed and ambulation.  

## 2017-10-02 NOTE — Patient Instructions (Signed)
1. Sign opioid contract 2. Start Cymablta 3. Rx for Percocet

## 2017-10-02 NOTE — Progress Notes (Signed)
Patient's Name: [name] [name]  MRN: [id number]  Referring Provider: Caren Macadam, MD  DOB: 18-Aug-1975  PCP: Caren Macadam, MD  DOS: 10/02/2017  Note by: Gillis Santa, MD  Service setting: Ambulatory outpatient  Specialty: Interventional Pain Management  Location: [facility] (AMB) Pain Management Facility    Patient type: Established   Primary Reason(s) for Visit: Encounter for evaluation before starting new chronic pain management plan of care (Level of risk: moderate) CC: Back Pain (lower)  HPI  [name] is a 42 y.o. year old, female patient, who comes today for a follow-up evaluation to review the test results and decide on a treatment plan. She has Pleuritic chest pain; SOB (shortness of breath); Hyperglycemia; Anemia; Tobacco use disorder; Saddle embolus of pulmonary artery with acute cor pulmonale ([place]); Secondary pulmonary hypertension; Obstructive sleep apnea; Back pain with left-sided radiculopathy; Iron deficiency anemia due to chronic blood loss; Benign essential hypertension; Morbid obesity ([place]); Fibroid, uterine; Abdominal cramps; History of pulmonary embolism; and Encounter for therapeutic drug monitoring on their problem list. Her primarily concern today is the Back Pain (lower)  Pain Assessment: Location: Lower Back Radiating: both legs to the feet Onset: More than a month ago Duration: Chronic pain Quality: Throbbing(piercing) Severity: 8 /10 (subjective, self-reported pain score)  Note: Reported level is inconsistent with clinical observations.                         When using our objective Pain Scale, levels between 6 and 10/10 are said to belong in an emergency room, as it progressively worsens from a 6/10, described as severely limiting, requiring emergency care not usually available at an outpatient pain management facility. At a 6/10 level, communication becomes difficult and requires great effort. Assistance to reach the emergency department may be required. Facial flushing  and profuse sweating along with potentially dangerous increases in heart rate and blood pressure will be evident. Effect on ADL:   Timing: Intermittent Modifying factors: heat/cold packs BP: 140/76  HR: 79  [name] comes in today for a follow-up visit after her initial evaluation on 09/08/2017. Today we went over the results of her tests. These were explained in "Layman's terms". During today's appointment we went over my diagnostic impression, as well as the proposed treatment plan.  Follows him for second patient visit.  States that Lyrica and diclofenac were not approved by insurance.  She is continuing her Coumadin.  In considering the treatment plan options, [name] was reminded that I no longer take patients for medication management only. I asked her to let me know if she had no intention of taking advantage of the interventional therapies, so that we could make arrangements to provide this space to someone interested. I also made it clear that undergoing interventional therapies for the purpose of getting pain medications is very inappropriate on the part of a patient, and it will not be tolerated in this practice. This type of behavior would suggest true addiction and therefore it requires referral to an addiction specialist.   Further details on both, my assessment(s), as well as the proposed treatment plan, please see below.  Controlled Substance Pharmacotherapy Assessment REMS (Risk Evaluation and Mitigation Strategy)  Analgesic: Oxycodone 10 mg twice daily, quantity 60/month MME/day: Approximately 30 mg/day. Pill Count: None expected due to no prior prescriptions written by our practice. Landis Martins, RN  10/02/2017 11:48 AM  Sign at close encounter Safety precautions to be maintained throughout the outpatient stay  will include: orient to surroundings, keep bed in low position, maintain call bell within reach at all times, provide assistance with transfer out of bed and  ambulation.    Pharmacokinetics: Liberation and absorption (onset of action): WNL Distribution (time to peak effect): WNL Metabolism and excretion (duration of action): WNL         Pharmacodynamics: Desired effects: Analgesia: [name] reports >50% benefit. Functional ability: Patient reports that medication allows her to accomplish basic ADLs Clinically meaningful improvement in function (CMIF): Sustained CMIF goals met Perceived effectiveness: Described as relatively effective, allowing for increase in activities of daily living (ADL) Undesirable effects: Side-effects or Adverse reactions: None reported Monitoring: [place] PMP: Online review of the past 34-monthperiod previously conducted. Not applicable at this point since we have not taken over the patient's medication management yet. List of other Serum/Urine Drug Screening Test(s):  Lab Results  Component Value Date   COCAINSCRNUR Negative 07/21/2014   COCAINSCRNUR NONE DETECTED 04/27/2013   THCU NONE DETECTED 04/27/2013   ETH 94 (H) 04/27/2013   List of all UDS test(s) done:  Lab Results  Component Value Date   SUMMARY FINAL 09/04/2017   Last UDS on record: Summary  Date Value Ref Range Status  09/04/2017 FINAL  Final    Comment:    ==================================================================== TOXASSURE COMP DRUG ANALYSIS,UR ==================================================================== Test                             Result       Flag       Units Drug Absent but Declared for Prescription Verification   Oxycodone                      Not Detected UNEXPECTED ng/mg creat   Pregabalin                     Not Detected UNEXPECTED   Diclofenac                     Not Detected UNEXPECTED    Diclofenac, as indicated in the declared medication list, is not    always detected even when used as directed. ==================================================================== Test                      Result    Flag    Units      Ref Range   Creatinine              45               mg/dL      >=20 ==================================================================== Declared Medications:  The flagging and interpretation on this report are based on the  following declared medications.  Unexpected results may arise from  inaccuracies in the declared medications.  **Note: The testing scope of this panel includes these medications:  Oxycodone  Pregabalin  **Note: The testing scope of this panel does not include small to  moderate amounts of these reported medications:  Diclofenac  **Note: The testing scope of this panel does not include following  reported medications:  Amlodipine Besylate  Hydrochlorothiazide (Triamterine-Hydrochlorthzide)  Lisinopril  Norethindrone  Triamterene (Triamterine-Hydrochlorthzide)  Warfarin ==================================================================== For clinical consultation, please call ([phone] ====================================================================    UDS interpretation: No unexpected findings.          Medication Assessment Form: Patient introduced to form today Treatment compliance: Treatment may start today  if patient agrees with proposed plan. Evaluation of compliance is not applicable at this point Risk Assessment Profile: Aberrant behavior: See initial evaluations. None observed or detected today Comorbid factors increasing risk of overdose: See initial evaluation. No additional risks detected today Opioid risk tool (ORT) (Total Score):   Personal History of Substance Abuse (SUD-Substance use disorder):  Alcohol:    Illegal Drugs:    Rx Drugs:    ORT Risk Level calculation:   Risk of substance use disorder (SUD): Low  ORT Scoring interpretation table:  Score <3 = Low Risk for SUD  Score between 4-7 = Moderate Risk for SUD  Score >8 = High Risk for Opioid Abuse   Risk Mitigation Strategies:  Patient opioid safety counseling:  Completed today. Counseling provided to patient as per "Patient Counseling Document". Document signed by patient, attesting to counseling and understanding Patient-Prescriber Agreement (PPA): Obtained today.  Controlled substance notification to other providers: Written and sent today.  Pharmacologic Plan: Today we may be taking over the patient's pharmacological regimen. See below.             Laboratory Chemistry  Inflammation Markers (CRP: Acute Phase) (ESR: Chronic Phase) Lab Results  Component Value Date   ESRSEDRATE 25 (H) 10/28/2013   LATICACIDVEN 6.2 (H) 04/27/2013                         Rheumatology Markers No results found for: RF, ANA, LABURIC, URICUR, LYMEIGGIGMAB, LYMEABIGMQN, HLAB27                      Renal Function Markers Lab Results  Component Value Date   BUN 13 07/31/2017   CREATININE 0.82 94/49/6759   BCR NOT APPLICABLE 16/38/4665   GFRAA >60 07/31/2017   GFRNONAA >60 07/31/2017                             Hepatic Function Markers Lab Results  Component Value Date   AST 15 07/31/2017   ALT 14 07/31/2017   ALBUMIN 3.4 (L) 07/31/2017   ALKPHOS 51 07/31/2017                        Electrolytes Lab Results  Component Value Date   NA 142 07/31/2017   K 4.3 07/31/2017   CL 112 (H) 07/31/2017   CALCIUM 8.7 (L) 07/31/2017                        Neuropathy Markers Lab Results  Component Value Date   VITAMINB12 307 05/19/2014   FOLATE 8.0 05/19/2014   HGBA1C 5.3 01/31/2017   HIV NON-REACTIVE 01/31/2017                        CNS Tests Lab Results  Component Value Date   SDES ABSCESS 01/10/2015   GRAMSTAIN  01/10/2015    ABUNDANT WBC PRESENT,BOTH PMN AND MONONUCLEAR NO SQUAMOUS EPITHELIAL CELLS SEEN NO ORGANISMS SEEN Performed at [facility]    CULT  01/10/2015    NO GROWTH 2 DAYS Performed at [facility]                         Bone Pathology Markers No results found for: [place], [id number], [id number], [id number],  25OHVITD1, 25OHVITD2, 25OHVITD3, TESTOFREE, TESTOSTERONE  Coagulation Parameters Lab Results  Component Value Date   INR 1.5 (A) 09/15/2017   LABPROT 31.1 (H) 07/31/2017   APTT 54 (H) 02/17/2015   PLT 269 07/31/2017   DDIMER 0.76 (H) 01/27/2014                        Cardiovascular Markers Lab Results  Component Value Date   TROPONINI <0.30 04/27/2013   HGB 10.7 (L) 07/31/2017   HCT 33.4 (L) 07/31/2017                         CA Markers No results found for: CEA, CA125, LABCA2                      Note: Lab results reviewed.  Recent Diagnostic Imaging Review  Lumbar CT wo contrast:  Results for orders placed during the hospital encounter of 08/14/15  CT Lumbar Spine Wo Contrast   Narrative CLINICAL DATA:  Chronic low back pain extending at the right lower extremity. Symptoms have progressed since yesterday. The patient is unable to undergo MRI. EXAM: CT LUMBAR SPINE WITHOUT CONTRAST TECHNIQUE: Multidetector CT imaging of the lumbar spine was performed without intravenous contrast administration. Multiplanar CT image reconstructions were also generated. COMPARISON:  None. FINDINGS: Five non rib-bearing lumbar type vertebral bodies are present. Mild focal leftward curvature of the lumbar spine is centered at L4-5. There slight anterolisthesis at L4-5 as well measuring 5 mm. Alignment is otherwise anatomic. Vertebral body heights are intact. An IVC filter is in place. Limited imaging of the abdomen is otherwise unremarkable. Chronic bone changes are associated sacroiliitis bilaterally. L1-2: Mild facet hypertrophy is present bilaterally without significant stenosis. L2-3: Mild facet hypertrophy is present. There is no significant stenosis. L3-4: A broad-based disc protrusion is asymmetric to the left moderate facet hypertrophy and ligamentum flavum thickening is present. Mild central canal stenosis and moderate foraminal narrowing is present  bilaterally, worse on the left. L4-5: A broad-based disc protrusion is present. Advanced facet hypertrophy is noted bilaterally. A vacuum phenomenon is present in the right facet. Severe central canal stenosis is present. Severe right and moderate left foraminal narrowing is evident. L5-S1: A central disc protrusion is present. Mild facet hypertrophy is noted bilaterally. This results an mild central and right foraminal narrowing. IMPRESSION: 1. Advanced facet hypertrophy results and 5 mm grade 1 anterolisthesis at L4-5. 2. There is uncovering of a broad-based disc protrusion at L4-5 with severe central canal stenosis, severe right, and moderate left foraminal narrowing. 3. Mild facet hypertrophy at L1-2 and L2-3 without significant stenosis at these levels. 4. Mild central and moderate bilateral foraminal stenosis at L3-4. 5. Mild central and right foraminal stenosis at L5-S1. Electronically Signed   By: San Morelle M.D.   On: 08/14/2015 17:05    Lumbar CT w contrast:  Results for orders placed during the hospital encounter of 01/20/17  CT LUMBAR SPINE W CONTRAST   Narrative CLINICAL DATA:  Right low back and hip pain occasionally radiating into the right leg.  EXAM: LUMBAR MYELOGRAM  FLUOROSCOPY TIME:  Radiation Exposure Index (as provided by the fluoroscopic device): 5601.00 microGray*m^2  Fluoroscopy Time (in minutes and seconds):  2 minutes 24 seconds  PROCEDURE: Dr. Christella Noa consented the patient and initially attempted a lumbar puncture without return of CSF. I then re-prepped and draped the patient in the usual sterile fashion and provided additional local anesthesia with 1% lidocaine. I  performed a lumbar puncture using a 7 inch 22 gauge spinal needle via a right paramedian approach at L2-3. Using a single pass through the dura, the needle was placed within the thecal sac, with return of clear CSF. 15 mL of Isovue M-200 was injected into the thecal sac,  with normal opacification of the nerve roots and cauda equina consistent with free flow within the subarachnoid space.  I personally performed the lumbar puncture and administered the intrathecal contrast. I also personally supervised acquisition of the myelogram images.  TECHNIQUE: Contiguous axial images were obtained through the Lumbar spine after the intrathecal infusion of infusion. Coronal and sagittal reconstructions were obtained of the axial image sets.  COMPARISON:  Noncontrast lumbar spine CT 08/14/2015  FINDINGS: LUMBAR MYELOGRAM FINDINGS:  There are 5 non rib-bearing lumbar type vertebrae. There is grade 1 anterolisthesis of L4 on L5 which increases with standing and flexion. Associated spinal stenosis at this level appears severe with standing. There is a small ventral extradural defect at L3-4 which does not significantly increase with standing and does not result in high-grade stenosis. The thecal sac appears widely patent elsewhere. An IVC filter is noted.  CT LUMBAR MYELOGRAM FINDINGS:  Grade 1 anterolisthesis of L4 on L5 in the supine position measures 4 mm, unchanged from the prior CT. There is also trace retrolisthesis of L3 on L4. Mild depression of the L4 superior endplate on the left is unchanged. No acute fracture or destructive osseous process is identified. The conus medullaris terminates at L1.  Chronic sacroiliitis is noted with associated sclerosis on both sides of the joint. An IVC filter is in place below the level the renal veins. Partially visualized uterine enlargement compatible with a fibroid described on prior ultrasound.  T12-L1: Mild facet arthrosis without disc herniation or stenosis.  L1-2:  Mild facet arthrosis without disc herniation or stenosis.  L2-3: Mild-to-moderate facet arthrosis without disc herniation or stenosis.  L3-4: Circumferential disc bulging, left foraminal disc osteophyte complex, and mild-to-moderate facet  and ligamentum flavum hypertrophy result in borderline spinal stenosis and mild right and moderate left neural foraminal stenosis, similar to the prior CT.  L4-5: Mild disc space narrowing. Anterolisthesis, bulging uncovered disc, right foraminal/ extraforaminal disc protrusion, congenitally short pedicles, and severe facet arthrosis result in moderate spinal stenosis and severe right and mild-to-moderate left neural foraminal stenosis, similar to prior. Potential right L4 nerve impingement in and lateral to the foramen. Widened facet joints containing gas consistent with instability demonstrated on upright radiographs.  L5-S1: Mild disc space narrowing. Mild disc bulging and facet hypertrophy result in borderline to mild right greater than left neural foraminal stenosis without spinal stenosis, unchanged.  IMPRESSION: 1. Severe facet arthrosis at L4-5 with grade 1 anterolisthesis which increases with standing and results in severe spinal stenosis. Severe right neural foraminal stenosis as well. 2. Borderline spinal stenosis and mild right and moderate left foraminal stenosis at L3-4. 3. Mild foraminal narrowing at L5-S1.   Electronically Signed   By: Logan Bores M.D.   On: 01/20/2017 15:39     Lumbar DG (Complete) 4+V:  Results for orders placed during the hospital encounter of 10/23/16  DG Lumbar Spine Complete   Narrative CLINICAL DATA:  Low back pain radiating to the left leg  EXAM: LUMBAR SPINE - COMPLETE 4+ VIEW  COMPARISON:  Lumbar spine CT 08/14/2015  FINDINGS: Unchanged grade 1 anterolisthesis at L4-L5 due to facet arthrosis. Vertebral body heights are preserved. Mild narrowing of the intervertebral disc space at  L4-5 and L5-S1. Lower lumbar facet hypertrophy at L4-L5 and L5-S1. There is an IVC filter.  IMPRESSION: Unchanged grade 1 anterolisthesis at L4-L5. No acute abnormality of the lumbar spine.   Electronically Signed   By: Ulyses Jarred M.D.   On:  10/23/2016 13:58     Lumbar DG Myelogram Lumbosacral:  Results for orders placed during the hospital encounter of 01/20/17  DG MYELOGRAPHY LUMBAR INJ LUMBOSACRAL   Narrative CLINICAL DATA:  Right low back and hip pain occasionally radiating into the right leg.  EXAM: LUMBAR MYELOGRAM  FLUOROSCOPY TIME:  Radiation Exposure Index (as provided by the fluoroscopic device): 5601.00 microGray*m^2  Fluoroscopy Time (in minutes and seconds):  2 minutes 24 seconds  PROCEDURE: Dr. Christella Noa consented the patient and initially attempted a lumbar puncture without return of CSF. I then re-prepped and draped the patient in the usual sterile fashion and provided additional local anesthesia with 1% lidocaine. I performed a lumbar puncture using a 7 inch 22 gauge spinal needle via a right paramedian approach at L2-3. Using a single pass through the dura, the needle was placed within the thecal sac, with return of clear CSF. 15 mL of Isovue M-200 was injected into the thecal sac, with normal opacification of the nerve roots and cauda equina consistent with free flow within the subarachnoid space.  I personally performed the lumbar puncture and administered the intrathecal contrast. I also personally supervised acquisition of the myelogram images.  TECHNIQUE: Contiguous axial images were obtained through the Lumbar spine after the intrathecal infusion of infusion. Coronal and sagittal reconstructions were obtained of the axial image sets.  COMPARISON:  Noncontrast lumbar spine CT 08/14/2015  FINDINGS: LUMBAR MYELOGRAM FINDINGS:  There are 5 non rib-bearing lumbar type vertebrae. There is grade 1 anterolisthesis of L4 on L5 which increases with standing and flexion. Associated spinal stenosis at this level appears severe with standing. There is a small ventral extradural defect at L3-4 which does not significantly increase with standing and does not result in high-grade stenosis. The  thecal sac appears widely patent elsewhere. An IVC filter is noted.  CT LUMBAR MYELOGRAM FINDINGS:  Grade 1 anterolisthesis of L4 on L5 in the supine position measures 4 mm, unchanged from the prior CT. There is also trace retrolisthesis of L3 on L4. Mild depression of the L4 superior endplate on the left is unchanged. No acute fracture or destructive osseous process is identified. The conus medullaris terminates at L1.  Chronic sacroiliitis is noted with associated sclerosis on both sides of the joint. An IVC filter is in place below the level the renal veins. Partially visualized uterine enlargement compatible with a fibroid described on prior ultrasound.  T12-L1: Mild facet arthrosis without disc herniation or stenosis.  L1-2:  Mild facet arthrosis without disc herniation or stenosis.  L2-3: Mild-to-moderate facet arthrosis without disc herniation or stenosis.  L3-4: Circumferential disc bulging, left foraminal disc osteophyte complex, and mild-to-moderate facet and ligamentum flavum hypertrophy result in borderline spinal stenosis and mild right and moderate left neural foraminal stenosis, similar to the prior CT.  L4-5: Mild disc space narrowing. Anterolisthesis, bulging uncovered disc, right foraminal/ extraforaminal disc protrusion, congenitally short pedicles, and severe facet arthrosis result in moderate spinal stenosis and severe right and mild-to-moderate left neural foraminal stenosis, similar to prior. Potential right L4 nerve impingement in and lateral to the foramen. Widened facet joints containing gas consistent with instability demonstrated on upright radiographs.  L5-S1: Mild disc space narrowing. Mild disc bulging and facet hypertrophy  result in borderline to mild right greater than left neural foraminal stenosis without spinal stenosis, unchanged.  IMPRESSION: 1. Severe facet arthrosis at L4-5 with grade 1 anterolisthesis which increases with standing  and results in severe spinal stenosis. Severe right neural foraminal stenosis as well. 2. Borderline spinal stenosis and mild right and moderate left foraminal stenosis at L3-4. 3. Mild foraminal narrowing at L5-S1.   Electronically Signed   By: Logan Bores M.D.   On: 01/20/2017 15:39     Results for orders placed during the hospital encounter of 09/25/13  DG Ankle Complete Left   Narrative CLINICAL DATA:  Fall with twisting injury of left ankle and diffuse soft tissue swelling.  EXAM: LEFT ANKLE COMPLETE - 3+ VIEW  COMPARISON:  None.  FINDINGS: Diffuse soft tissue swelling is identified surrounding the ankle and proximal foot. In the lateral projection, there are avulsion fractures involving the neck of the talus and the superior surface of the navicular bone. The talar avulsion shows single mildly displaced fragment. The superior navicular avulsions show mild displacement of several cortical avulsive fragments.  The ankle mortise shows normal alignment. No evidence of ankle fracture.  IMPRESSION: Acute avulsive injuries involving the neck of the talus and superior surface of the navicular bone with mild displacement of several cortical avulsive fragments. Diffuse soft tissue swelling is present.   Electronically Signed   By: Aletta Edouard M.D.   On: 09/25/2013 12:20      Complexity Note: Imaging results reviewed. Results shared with Ms. Thorup, using Layman's terms.                         Meds   Current Outpatient Medications:  .  amLODipine (NORVASC) 10 MG tablet, Take 1 tablet (10 mg total) by mouth daily., Disp: 90 tablet, Rfl: 3 .  diclofenac (VOLTAREN) 75 MG EC tablet, Take 1 tablet (75 mg total) by mouth 2 (two) times daily., Disp: 60 tablet, Rfl: 0 .  lisinopril (PRINIVIL,ZESTRIL) 40 MG tablet, Take 1 tablet (40 mg total) by mouth daily., Disp: 90 tablet, Rfl: 3 .  norethindrone (MICRONOR,CAMILA,ERRIN) 0.35 MG tablet, TAKE ONE TABLET BY MOUTH ONCE  DAILY, Disp: 28 tablet, Rfl: 3 .  Oxycodone HCl 10 MG TABS, Take one tablet every 8 hours as needed for uncontrolled pain, Disp: 15 tablet, Rfl: 0 .  triamterene-hydrochlorothiazide (MAXZIDE-25) 37.5-25 MG tablet, TAKE 1 TABLET BY MOUTH ONCE A DAY., Disp: 30 tablet, Rfl: 3 .  warfarin (COUMADIN) 1 MG tablet, Take 1 tablet (1 mg total) by mouth daily., Disp: 30 tablet, Rfl: 3 .  warfarin (COUMADIN) 10 MG tablet, Take 1 tablet (10 mg total) by mouth daily., Disp: 30 tablet, Rfl: 3 .  warfarin (COUMADIN) 2.5 MG tablet, Take 1 tablet (2.5 mg total) by mouth daily., Disp: 30 tablet, Rfl: 3 .  DULoxetine (CYMBALTA) 30 MG capsule, Take 1 capsule (30 mg total) by mouth daily for 30 days, THEN 2 capsules (60 mg total) daily., Disp: 150 capsule, Rfl: 0 .  oxyCODONE-acetaminophen (PERCOCET) 10-325 MG tablet, Take 1 tablet by mouth 2 (two) times daily as needed for pain., Disp: 60 tablet, Rfl: 0 .  [START ON 11/01/2017] oxyCODONE-acetaminophen (PERCOCET) 10-325 MG tablet, Take 1 tablet by mouth 2 (two) times daily as needed for pain., Disp: 60 tablet, Rfl: 0 No current facility-administered medications for this visit.   Facility-Administered Medications Ordered in Other Visits:  .  0.9 %  sodium chloride infusion, , Intravenous, Continuous,  Baird Cancer, PA-C, Last Rate: 20 mL/hr at 12/22/15 1430  ROS  Constitutional: Denies any fever or chills Gastrointestinal: No reported hemesis, hematochezia, vomiting, or acute GI distress Musculoskeletal: Denies any acute onset joint swelling, redness, loss of ROM, or weakness Neurological: No reported episodes of acute onset apraxia, aphasia, dysarthria, agnosia, amnesia, paralysis, loss of coordination, or loss of consciousness  Allergies  Ms. Nawabi is allergic to sulfa antibiotics.  PFSH  Drug: Ms. Muilenburg  reports that she does not use drugs. Alcohol:  reports that she drinks about 2.0 standard drinks of alcohol per week. Tobacco:  reports that she has been  smoking cigarettes. She has a 4.50 pack-year smoking history. She has never used smokeless tobacco. Medical:  has a past medical history of Abdominal cramps (08/30/2015), Anemia, Blood transfusion without reported diagnosis, Closed left ankle fracture, Clotting disorder ([place]), DVT (deep venous thrombosis) ([place]), Fibroid tumor, Hidradenitis, High blood pressure, Hypertension, Obesity, OSA on CPAP, Oxygen deficiency, Pregnancy, Pulmonary embolism ([place]) (04/2013), Sleep apnea, and Tobacco use disorder. Surgical: Ms. Hamric  has a past surgical history that includes Cyst removal trunk; Abscess drainage (Bilateral, 01/10/15); and IVC FILTER PLACEMENT ([facility] HX). Family: family history includes Cancer in her maternal grandmother; Diabetes in her paternal aunt and paternal uncle; Heart attack in her paternal grandmother; Hypertension in her mother; Other in her father.  Constitutional Exam  General appearance: Well nourished, well developed, and well hydrated. In no apparent acute distress Vitals:   10/02/17 1144  BP: 140/76  Pulse: 79  Resp: 16  Temp: 98.5 F (36.9 C)  TempSrc: Oral  SpO2: 100%  Weight: (!) 361 lb (163.7 kg)  Height: _0  (1.778 m)   BMI Assessment: Estimated body mass index is 51.8 kg/m as calculated from the following:   Height as of this encounter: _1  (1.778 m).   Weight as of this encounter: 361 lb (163.7 kg).  BMI interpretation table: BMI level Category Range association with higher incidence of chronic pain  <18 kg/m2 Underweight   18.5-24.9 kg/m2 Ideal body weight   25-29.9 kg/m2 Overweight Increased incidence by 20%  30-34.9 kg/m2 Obese (Class I) Increased incidence by 68%  35-39.9 kg/m2 Severe obesity (Class II) Increased incidence by 136%  >40 kg/m2 Extreme obesity (Class III) Increased incidence by 254%   Patient's current BMI Ideal Body weight  Body mass index is 51.8 kg/m. Ideal body weight: 68.5 kg (151 lb 0.2 oz) Adjusted ideal body weight: 106.6 kg  (235 lb 0.2 oz)   BMI Readings from Last 4 Encounters:  10/02/17 51.80 kg/m  09/04/17 51.80 kg/m  08/27/17 52.23 kg/m  07/31/17 53.29 kg/m   Wt Readings from Last 4 Encounters:  10/02/17 (!) 361 lb (163.7 kg)  09/04/17 (!) 361 lb (163.7 kg)  08/27/17 (!) 364 lb (165.1 kg)  07/31/17 (!) 371 lb 6.4 oz (168.5 kg)  Psych/Mental status: Alert, oriented x 3 (person, place, & time)       Eyes: PERLA Respiratory: No evidence of acute respiratory distress  Cervical Spine Area Exam  Skin & Axial Inspection: No masses, redness, edema, swelling, or associated skin lesions Alignment: Symmetrical Functional ROM: Unrestricted ROM      Stability: No instability detected Muscle Tone/Strength: Functionally intact. No obvious neuro-muscular anomalies detected. Sensory (Neurological): Unimpaired Palpation: No palpable anomalies              Upper Extremity (UE) Exam    Side: Right upper extremity  Side: Left upper extremity  Skin & Extremity  Inspection: Skin color, temperature, and hair growth are WNL. No peripheral edema or cyanosis. No masses, redness, swelling, asymmetry, or associated skin lesions. No contractures.  Skin & Extremity Inspection: Skin color, temperature, and hair growth are WNL. No peripheral edema or cyanosis. No masses, redness, swelling, asymmetry, or associated skin lesions. No contractures.  Functional ROM: Unrestricted ROM          Functional ROM: Unrestricted ROM          Muscle Tone/Strength: Functionally intact. No obvious neuro-muscular anomalies detected.  Muscle Tone/Strength: Functionally intact. No obvious neuro-muscular anomalies detected.  Sensory (Neurological): Unimpaired          Sensory (Neurological): Unimpaired          Palpation: No palpable anomalies              Palpation: No palpable anomalies              Provocative Test(s):  Phalen's test: deferred Tinel's test: deferred Apley's scratch test (touch opposite shoulder):  Action 1 (Across chest):  deferred Action 2 (Overhead): deferred Action 3 (LB reach): deferred   Provocative Test(s):  Phalen's test: deferred Tinel's test: deferred Apley's scratch test (touch opposite shoulder):  Action 1 (Across chest): deferred Action 2 (Overhead): deferred Action 3 (LB reach): deferred    Thoracic Spine Area Exam  Skin & Axial Inspection: No masses, redness, or swelling Alignment: Symmetrical Functional ROM: Unrestricted ROM Stability: No instability detected Muscle Tone/Strength: Functionally intact. No obvious neuro-muscular anomalies detected. Sensory (Neurological): Unimpaired Muscle strength & Tone: No palpable anomalies   Lumbar Spine Area Exam  Skin & Axial Inspection: No masses, redness, or swelling Alignment: Symmetrical Functional ROM: Decreased ROM       Stability: No instability detected Muscle Tone/Strength: Functionally intact. No obvious neuro-muscular anomalies detected. Sensory (Neurological): Dermatomal pain pattern and musculoskeletal Palpation: No palpable anomalies       Provocative Tests: Hyperextension/rotation test: (+) bilaterally for facet joint pain. Lumbar quadrant test (Kemp's test): (+) bilaterally for facet joint pain. Lateral bending test: (+) ipsilateral radicular pain, on the left. Positive for left-sided foraminal stenosis. Patrick's Maneuver: Unable to perform                   FABER test: Unable to perform                   S-I anterior distraction/compression test: deferred today         S-I lateral compression test: deferred today         S-I Thigh-thrust test: deferred today         S-I Gaenslen's test: deferred today           Gait & Posture Assessment  Ambulation: Unassisted Gait: Relatively normal for age and body habitus Posture: WNL   Lower Extremity Exam    Side: Right lower extremity  Side: Left lower extremity  Stability: No instability observed          Stability: No instability observed          Skin & Extremity  Inspection: Skin color, temperature, and hair growth are WNL. No peripheral edema or cyanosis. No masses, redness, swelling, asymmetry, or associated skin lesions. No contractures.  Skin & Extremity Inspection: Skin color, temperature, and hair growth are WNL. No peripheral edema or cyanosis. No masses, redness, swelling, asymmetry, or associated skin lesions. No contractures.  Functional ROM: Unrestricted ROM  Functional ROM: Unrestricted ROM                  Muscle Tone/Strength: Functionally intact. No obvious neuro-muscular anomalies detected.  Muscle Tone/Strength: Functionally intact. No obvious neuro-muscular anomalies detected.  Sensory (Neurological): Unimpaired  Sensory (Neurological): Unimpaired  Palpation: No palpable anomalies  Palpation: No palpable anomalies   Assessment & Plan  Primary Diagnosis & Pertinent Problem List: The primary encounter diagnosis was Lumbar spondylosis. Diagnoses of Lumbar facet arthropathy, Lumbar degenerative disc disease, Lumbar foraminal stenosis, Morbid obesity ([place]), History of pulmonary embolism, Acute saddle pulmonary embolism with acute cor pulmonale ([place]), and Chronic pain syndrome were also pertinent to this visit.  Visit Diagnosis: 1. Lumbar spondylosis   2. Lumbar facet arthropathy   3. Lumbar degenerative disc disease   4. Lumbar foraminal stenosis   5. Morbid obesity ([place])   6. History of pulmonary embolism   7. Acute saddle pulmonary embolism with acute cor pulmonale (HCC)   8. Chronic pain syndrome    General Recommendations: The pain condition that the patient suffers from is best treated with a multidisciplinary approach that involves an increase in physical activity to prevent de-conditioning and worsening of the pain cycle, as well as psychological counseling (formal and/or informal) to address the co-morbid psychological affects of pain. Treatment will often involve judicious use of pain medications and interventional  procedures to decrease the pain, allowing the patient to participate in the physical activity that will ultimately produce long-lasting pain reductions. The goal of the multidisciplinary approach is to return the patient to a higher level of overall function and to restore their ability to perform activities of daily living.  42 year old female with a history of morbid obesity who presents with axial low back pain that radiates down her left side.  Patient states that she sustained a left ankle fracture in 2015 and then went on to have a DVT that progressed to a pulmonary embolus.  Patient describes worsening axial low back pain that radiates down her left hip and left thigh.  She also describes numbness and tingling in both hands and both feet.  Patient has been managed on oxycodone 10 mg twice daily however her providing physician is moving.  Patient has not participated in any recent physical therapy for her low back pain but has completed physical therapy for her left ankle after left ankle fracture.  Patient is currently on Coumadin.  In regards to treatment options, I recommended physical therapy and possibly aquatic therapy to help out with lumbar paraspinal muscle strengthening and range of motion but pt states she can't afford it.  For her neuropathic pain symptoms secondary to left foraminal stenosis and radiculopathy, I prescribed  Lyrica 50 mill grams nightly for 2 weeks then increase to 50 mill grams twice daily but insurance denied.  I also prescribed diclofenac (anti-inflammatory, 75 mg twice daily to be taken after meal (patient's creatinine was 0.82) however insurance denied that s well.  Patient's urine drug screen was reviewed.  It was negative for oxycodone however patient states that she was not taking any at the time and prior to her urine drug screen.  She has been on opioid therapy for the last 3 years, her dose has been consistent on oxycodone 10 mg twice daily as needed.  We discussed  changing to Percocet given additional relief from Tylenol.  Patient was amenable to this change.  Patient will also complete opioid contract.  We will also trial Cymbalta as below given that  Lyrica was not covered by insurance and that she has tried gabapentin in the past and did not find it effective.  Not a procedural candidate given that patient is on Coumadin for history of saddle PE and is too high risk to be off of Coumadin  Future considerations: Topamax, TCA, muscle relaxant  Plan of Care  Pharmacotherapy (Medications Ordered): Meds ordered this encounter  Medications  . DULoxetine (CYMBALTA) 30 MG capsule    Sig: Take 1 capsule (30 mg total) by mouth daily for 30 days, THEN 2 capsules (60 mg total) daily.    Dispense:  150 capsule    Refill:  0  . oxyCODONE-acetaminophen (PERCOCET) 10-325 MG tablet    Sig: Take 1 tablet by mouth 2 (two) times daily as needed for pain.    Dispense:  60 tablet    Refill:  0    Do not place this medication, or any other prescription from our practice, on "Automatic Refill". Patient may have prescription filled one day early if pharmacy is closed on scheduled refill date.  Marland Kitchen oxyCODONE-acetaminophen (PERCOCET) 10-325 MG tablet    Sig: Take 1 tablet by mouth 2 (two) times daily as needed for pain.    Dispense:  60 tablet    Refill:  0    Do not place this medication, or any other prescription from our practice, on "Automatic Refill". Patient may have prescription filled one day early if pharmacy is closed on scheduled refill date.    Provider-requested follow-up: Return in about 8 weeks (around 11/27/2017) for MM with Crystal.   Time Note: Greater than 50% of the 25 minute(s) of face-to-face time spent with Ms. Strathman, was spent in counseling/coordination of care regarding: Ms. Couchman primary cause of pain, the treatment plan, treatment alternatives, medication side effects, going over the informed consent, the opioid analgesic risks and possible  complications, the goals of pain management (increased in functionality), the need to bring and keep the BMI below 30, the medication agreement and the patient's responsibilities when it comes to controlled substances.  Future Appointments  Date Time Provider [place]  10/31/2017 10:50 AM [facility] None  11/07/2017 10:30 AM Higgs, Mathis Dad, MD [facility] None  11/27/2017 11:30 AM Vevelyn Francois, NP [facility] None    Primary Care Physician: Caren Macadam, MD Location: [facility] Note by: Gillis Santa, M.D Date: 10/02/2017; Time: 3:40 PM  Patient Instructions  1. Sign opioid contract 2. Start Cymablta 3. Rx for Percocet

## 2017-10-30 ENCOUNTER — Other Ambulatory Visit: Payer: Self-pay | Admitting: Cardiology

## 2017-10-30 DIAGNOSIS — I2602 Saddle embolus of pulmonary artery with acute cor pulmonale: Secondary | ICD-10-CM

## 2017-10-31 ENCOUNTER — Inpatient Hospital Stay (HOSPITAL_COMMUNITY): Payer: Medicaid Other | Attending: Hematology

## 2017-10-31 DIAGNOSIS — I2602 Saddle embolus of pulmonary artery with acute cor pulmonale: Secondary | ICD-10-CM

## 2017-10-31 DIAGNOSIS — I10 Essential (primary) hypertension: Secondary | ICD-10-CM | POA: Insufficient documentation

## 2017-10-31 DIAGNOSIS — I2692 Saddle embolus of pulmonary artery without acute cor pulmonale: Secondary | ICD-10-CM | POA: Diagnosis not present

## 2017-10-31 DIAGNOSIS — Z7901 Long term (current) use of anticoagulants: Secondary | ICD-10-CM | POA: Insufficient documentation

## 2017-10-31 DIAGNOSIS — D509 Iron deficiency anemia, unspecified: Secondary | ICD-10-CM | POA: Diagnosis present

## 2017-10-31 DIAGNOSIS — Z720 Tobacco use: Secondary | ICD-10-CM | POA: Insufficient documentation

## 2017-10-31 LAB — COMPREHENSIVE METABOLIC PANEL
ALK PHOS: 48 U/L (ref 38–126)
ALT: 11 U/L (ref 0–44)
AST: 15 U/L (ref 15–41)
Albumin: 3.8 g/dL (ref 3.5–5.0)
Anion gap: 7 (ref 5–15)
BILIRUBIN TOTAL: 0.4 mg/dL (ref 0.3–1.2)
BUN: 16 mg/dL (ref 6–20)
CHLORIDE: 105 mmol/L (ref 98–111)
CO2: 26 mmol/L (ref 22–32)
Calcium: 9 mg/dL (ref 8.9–10.3)
Creatinine, Ser: 0.99 mg/dL (ref 0.44–1.00)
GFR calc Af Amer: >60 mL/min (ref >60)
Glucose, Bld: 100 mg/dL — ABNORMAL HIGH (ref 70–99)
Potassium: 3.9 mmol/L (ref 3.5–5.1)
Sodium: 138 mmol/L (ref 135–145)
TOTAL PROTEIN: 8.2 g/dL — AB (ref 6.5–8.1)

## 2017-10-31 LAB — CBC WITH DIFFERENTIAL/PLATELET
ABS IMMATURE GRANULOCYTES: 0.01 10*3/uL (ref 0.00–0.07)
Basophils Absolute: 0 10*3/uL (ref 0.0–0.1)
Basophils Relative: 1 %
Eosinophils Absolute: 0.1 10*3/uL (ref 0.0–0.5)
Eosinophils Relative: 3 %
HEMATOCRIT: 38 % (ref 36.0–46.0)
HEMOGLOBIN: 11.4 g/dL — AB (ref 12.0–15.0)
IMMATURE GRANULOCYTES: 0 %
LYMPHS PCT: 12 %
Lymphs Abs: 0.5 10*3/uL — ABNORMAL LOW (ref 0.7–4.0)
MCH: 27.1 pg (ref 26.0–34.0)
MCHC: 30 g/dL (ref 30.0–36.0)
MCV: 90.3 fL (ref 80.0–100.0)
MONO ABS: 0.5 10*3/uL (ref 0.1–1.0)
Monocytes Relative: 12 %
NEUTROS ABS: 3 10*3/uL (ref 1.7–7.7)
Neutrophils Relative %: 72 %
Platelets: 307 10*3/uL (ref 150–400)
RBC: 4.21 MIL/uL (ref 3.87–5.11)
RDW: 19.3 % — ABNORMAL HIGH (ref 11.5–15.5)
WBC: 4.2 10*3/uL (ref 4.0–10.5)
nRBC: 0 % (ref 0.0–0.2)

## 2017-10-31 LAB — LACTATE DEHYDROGENASE: LDH: 118 U/L (ref 98–192)

## 2017-10-31 LAB — FERRITIN: Ferritin: 15 ng/mL (ref 11–307)

## 2017-10-31 NOTE — Telephone Encounter (Signed)
Pt overdue for follow-up, missed appt on 09/23/17.  Attempted to contact pt, LMOM TCB for appt, unable to refill Warfarin until seen in office.

## 2017-11-03 ENCOUNTER — Telehealth: Payer: Self-pay | Admitting: Student in an Organized Health Care Education/Training Program

## 2017-11-03 NOTE — Telephone Encounter (Signed)
Contacted pharmacy, there is another script for Percocet available to be filled. Patient notified.

## 2017-11-03 NOTE — Telephone Encounter (Signed)
Was supposed to have script for 2 months sent to pharmacy her next refill appt is Nov, patient calling to check on this. Please let her know status. She is out of meds.  [phone]

## 2017-11-04 NOTE — Telephone Encounter (Signed)
Spoke with pt.  Coumadin appointment made for 10/17.  Will refill coumadin at that appt.  Has enough warfarin to last till then.

## 2017-11-06 ENCOUNTER — Ambulatory Visit (INDEPENDENT_AMBULATORY_CARE_PROVIDER_SITE_OTHER): Payer: Medicaid Other | Admitting: *Deleted

## 2017-11-06 DIAGNOSIS — I2782 Chronic pulmonary embolism: Secondary | ICD-10-CM

## 2017-11-06 DIAGNOSIS — Z86711 Personal history of pulmonary embolism: Secondary | ICD-10-CM

## 2017-11-06 DIAGNOSIS — Z5181 Encounter for therapeutic drug level monitoring: Secondary | ICD-10-CM

## 2017-11-06 DIAGNOSIS — I2602 Saddle embolus of pulmonary artery with acute cor pulmonale: Secondary | ICD-10-CM

## 2017-11-06 LAB — POCT INR: INR: 1 — AB (ref 2.0–3.0)

## 2017-11-06 MED ORDER — WARFARIN SODIUM 2.5 MG PO TABS: ORAL_TABLET | ORAL | 1 refills | Status: DC

## 2017-11-06 MED ORDER — WARFARIN SODIUM 10 MG PO TABS: 10.0000 mg | ORAL_TABLET | Freq: Every day | ORAL | 1 refills | Status: DC

## 2017-11-06 NOTE — Telephone Encounter (Signed)
Warfarin Rx sent to [facility]

## 2017-11-06 NOTE — Patient Instructions (Signed)
Take coumadin 20mg  x  4 days then resume 15mg  daily Recheck in 1 week

## 2017-11-07 ENCOUNTER — Encounter (HOSPITAL_COMMUNITY): Payer: Self-pay | Admitting: Internal Medicine

## 2017-11-07 ENCOUNTER — Inpatient Hospital Stay (HOSPITAL_BASED_OUTPATIENT_CLINIC_OR_DEPARTMENT_OTHER): Payer: Medicaid Other | Admitting: Internal Medicine

## 2017-11-07 ENCOUNTER — Other Ambulatory Visit: Payer: Self-pay

## 2017-11-07 VITALS — BP 147/80 | HR 77 | Temp 98.3°F | Resp 18 | Wt 355.7 lb

## 2017-11-07 DIAGNOSIS — D509 Iron deficiency anemia, unspecified: Secondary | ICD-10-CM | POA: Diagnosis not present

## 2017-11-07 DIAGNOSIS — I10 Essential (primary) hypertension: Secondary | ICD-10-CM

## 2017-11-07 DIAGNOSIS — Z720 Tobacco use: Secondary | ICD-10-CM

## 2017-11-07 DIAGNOSIS — R0602 Shortness of breath: Secondary | ICD-10-CM | POA: Diagnosis not present

## 2017-11-07 DIAGNOSIS — I2692 Saddle embolus of pulmonary artery without acute cor pulmonale: Secondary | ICD-10-CM | POA: Diagnosis not present

## 2017-11-07 DIAGNOSIS — D500 Iron deficiency anemia secondary to blood loss (chronic): Secondary | ICD-10-CM

## 2017-11-07 NOTE — Progress Notes (Signed)
Diagnosis No diagnosis found.  Staging Cancer Staging No matching staging information was found for the patient.  Assessment and Plan:  1.  Saddle PE.  Patient should continue to follow-up with coumadin clinic.  Pt reports she had discussion by clinic regarding [place] therapy.    2. Iron deficiency anemia.  Pt was last treated with IV iron on 03/17/2017.  Labs done 10/31/2017 reviewed and showed WBC 4.2 HB 11.4 plts 307,000.  Chemistries WNL with K+ 3.9 Cr 0.99 and normal LFTs.  Ferritin is 15.  Pt will be set up for Injectafer 750 mg IV D1 and D8.  She is referred to GI for evaluation of IDA.  Pt will RTC in 12/2017 for follow-up and repeat labs after IV iron.    3.  Smoking.  Cessation is recommended especially due to history of thrombosis.    4.  HTN.  BP is 147/80.  Follow-up with PCP.    5.  SOB.  Pulse ox is 99% on room air.    25 minutes spent with more than 50% spent in counseling and coordination of care.    INTERVAL HISTORY:  Historical data obtained from note dated 04/22/2017.  42 year old female with saddle PE, unprovoked with CT evidence of right strain and clot extending from the distal main pulmonary artery to the left and right pulmonary artery extension into the LUL, LLL and lingula branches. She was hospitalized for right heart strain and had IVC filter placement on 04/2013.  She was seen at [facility] coagulation clinic in March 2017 for lifelong anticoagulation. She was treated in the past with lovenox bridge for coumadin.  She is currently on coumadin.   Pt also has a history of Iron deficiency anemia and was treated with  IV iron replacement therapy.  She had a second opinion at [facility]: Patient Instructions - [name], MD - 04/18/2015 9:20 AM EDT You should continue on blood thinners long term due to your previous unprovoked clots, especially given the severe nature of your lung clots. Similar to my recommendations at your initial visit, I would not recommend  transitioning to one of the new oral anticoagulants like Xarelto at this time. These medications have not been studied well in individuals with weights >~250 pounds, so we don't know how effective they are in this population. As such, if you wish to change from the Lovenox shots to an oral medication, I would recommend warfarin or Coumadin. You would start this and overlap it with Lovenox until your Coumadin levels are in the therapeutic range. This therapeutic range is an INR of 2-3. You will need to have someone locally to follow your INR levels and adjust Coumadin doses as needed. This is typically done by a primary care doctor, although Dr. Whitney Muse may be able to assist as well. You should be mindful of having stable intake of vitamin K containing foods while on Coumadin. You do not need to eliminate them from your diet but just eat a similar amount every week. Otherwise it will make keeping your Coumadin levels in the therapeutic range of difficult task. I anticipate having data in the next few years on the use of the new oral anticoagulants at the extremes of the body weight spectrum, so likely we can transitioning to one of these medicines in the future.  Regarding your left leg pain symptoms, I imagine it is due to something called post-thrombotic syndrome. The main treatment for this is to wear compression stockings. Try the  over-the-counter type first and if no improvement then you can fill the prescription provided today for medical grade stockings. If ultimately you cannot tolerate the stockings or they do not improve symptoms significantly, then there is no need to continue wearing them. I recommend that you avoid any estrogen containing systemic hormonal preparations. The safest hormonal option for birth control is the [place] or [place] IUDs. The next safest options are the progesterone only pills or other progesterone only preparations. Taking them while on anticoagulation is likely safe, but again I  would recommend avoiding estrogen containing preparations. If you were to become pregnant again in the future, you would need to be treated with Lovenox throughout the pregnancy like before.  For now, lets plan to see back here every 1-2 years to reassess the decision for long-term blood thinners and if there are alternative options available to you. I'm happy to see her sooner if new questions or problems.  Current Status:  Pt is seen today for follow-up to go over labs.  She remains on coumadin.    Problem List Patient Active Problem List   Diagnosis Date Noted  . Encounter for therapeutic drug monitoring [Z51.81] 09/03/2017  . History of pulmonary embolism [Z86.711] 08/27/2017  . Abdominal cramps [R10.9] 08/30/2015  . Morbid obesity ([place]) [E66.01] 08/18/2014  . Fibroid, uterine [D25.9] 08/18/2014  . Benign essential hypertension [I10] 07/21/2014  . Iron deficiency anemia due to chronic blood loss [D50.0] 02/05/2014  . Back pain with left-sided radiculopathy [M54.10] 10/31/2013  . Secondary pulmonary hypertension [IMO0002] 07/06/2013  . Obstructive sleep apnea [G47.33] 07/06/2013  . Pleuritic chest pain [R07.81] 04/27/2013  . SOB (shortness of breath) [R06.02] 04/27/2013  . Hyperglycemia [R73.9] 04/27/2013  . Anemia [D64.9] 04/27/2013  . Tobacco use disorder [F17.200] 04/27/2013  . Saddle embolus of pulmonary artery with acute cor pulmonale (HCC) [I26.02] 04/27/2013    Past Medical History Past Medical History:  Diagnosis Date  . Abdominal cramps 08/30/2015  . Anemia   . Blood transfusion without reported diagnosis   . Closed left ankle fracture    August 30 2012  . Clotting disorder ([place])   . DVT (deep venous thrombosis) (HCC)    L leg  . Fibroid tumor   . Hidradenitis   . High blood pressure   . Hypertension   . Obesity   . OSA on CPAP   . Oxygen deficiency   . Pregnancy    09/20/14 17 weeks  . Pulmonary embolism ([place]) 04/2013  . Sleep apnea   . Tobacco use disorder      Past Surgical History Past Surgical History:  Procedure Laterality Date  . ABSCESS DRAINAGE Bilateral 01/10/15  . CYST REMOVAL TRUNK    . IVC FILTER PLACEMENT ([facility] HX)      Family History Family History  Problem Relation Age of Onset  . Hypertension Mother   . Diabetes Paternal Aunt   . Diabetes Paternal Uncle   . Other Father        blood clots  . Cancer Maternal Grandmother   . Heart attack Paternal Grandmother      Social History  reports that she has been smoking cigarettes. She has a 4.50 pack-year smoking history. She has never used smokeless tobacco. She reports that she drinks about 2.0 standard drinks of alcohol per week. She reports that she does not use drugs.  Medications  Current Outpatient Medications:  .  amLODipine (NORVASC) 10 MG tablet, Take 1 tablet (10 mg total) by mouth daily.,  Disp: 90 tablet, Rfl: 3 .  DULoxetine (CYMBALTA) 30 MG capsule, Take 1 capsule (30 mg total) by mouth daily for 30 days, THEN 2 capsules (60 mg total) daily., Disp: 150 capsule, Rfl: 0 .  lisinopril (PRINIVIL,ZESTRIL) 40 MG tablet, Take 1 tablet (40 mg total) by mouth daily., Disp: 90 tablet, Rfl: 3 .  norethindrone (MICRONOR,CAMILA,ERRIN) 0.35 MG tablet, TAKE ONE TABLET BY MOUTH ONCE DAILY, Disp: 28 tablet, Rfl: 3 .  oxyCODONE-acetaminophen (PERCOCET) 10-325 MG tablet, Take 1 tablet by mouth 2 (two) times daily as needed for pain., Disp: 60 tablet, Rfl: 0 .  triamterene-hydrochlorothiazide (MAXZIDE-25) 37.5-25 MG tablet, TAKE 1 TABLET BY MOUTH ONCE A DAY., Disp: 30 tablet, Rfl: 3 .  warfarin (COUMADIN) 10 MG tablet, Take 1 tablet (10 mg total) by mouth daily., Disp: 30 tablet, Rfl: 1 .  warfarin (COUMADIN) 2.5 MG tablet, Take 2 tablets (5mg ) daily along with the 10mg  tablet for a total of 15mg  daily, Disp: 60 tablet, Rfl: 1 No current facility-administered medications for this visit.   Facility-Administered Medications Ordered in Other Visits:  .  0.9 %  sodium chloride  infusion, , Intravenous, Continuous, Kefalas, Manon Hilding, PA-C, Last Rate: 20 mL/hr at 12/22/15 1430  Allergies Sulfa antibiotics  Review of Systems Review of Systems - Oncology ROS negative other than fatigue.     Physical Exam  Vitals Wt Readings from Last 3 Encounters:  11/07/17 (!) 355 lb 11.2 oz (161.3 kg)  10/02/17 (!) 361 lb (163.7 kg)  09/04/17 (!) 361 lb (163.7 kg)   Temp Readings from Last 3 Encounters:  11/07/17 98.3 F (36.8 C) (Oral)  10/02/17 98.5 F (36.9 C) (Oral)  09/04/17 98.2 F (36.8 C) (Oral)   BP Readings from Last 3 Encounters:  11/07/17 (!) 147/80  10/02/17 140/76  09/04/17 (!) 142/76   Pulse Readings from Last 3 Encounters:  11/07/17 77  10/02/17 79  09/04/17 (!) 155   Constitutional: Well-developed, well-nourished, and in no distress.   HENT: Head: Normocephalic and atraumatic.  Mouth/Throat: No oropharyngeal exudate. Mucosa moist. Eyes: Pupils are equal, round, and reactive to light. Conjunctivae are normal. No scleral icterus.  Neck: Normal range of motion. Neck supple. No JVD present.  Cardiovascular: Normal rate, regular rhythm and normal heart sounds.  Exam reveals no gallop and no friction rub.   No murmur heard. Pulmonary/Chest: Effort normal and breath sounds normal. No respiratory distress. No wheezes.No rales.  Abdominal: Soft. Bowel sounds are normal. No distension. There is no tenderness. There is no guarding.  Musculoskeletal: No edema or tenderness.  Lymphadenopathy: No cervical, axillary or supraclavicular adenopathy.  Neurological: Alert and oriented to person, place, and time. No cranial nerve deficit.  Skin: Skin is warm and dry. No rash noted. No erythema. No pallor.  Psychiatric: Affect and judgment normal.   Labs Anti-coag visit on 11/06/2017  Component Date Value Ref Range Status  . INR 11/06/2017 1.0* 2.0 - 3.0 Final     Pathology No orders of the defined types were placed in this encounter.      Zoila Shutter MD

## 2017-11-11 ENCOUNTER — Ambulatory Visit (INDEPENDENT_AMBULATORY_CARE_PROVIDER_SITE_OTHER): Payer: Medicaid Other | Admitting: *Deleted

## 2017-11-11 DIAGNOSIS — Z86711 Personal history of pulmonary embolism: Secondary | ICD-10-CM

## 2017-11-11 DIAGNOSIS — Z5181 Encounter for therapeutic drug level monitoring: Secondary | ICD-10-CM | POA: Diagnosis not present

## 2017-11-11 LAB — POCT INR: INR: 3.6 — AB (ref 2.0–3.0)

## 2017-11-11 NOTE — Patient Instructions (Signed)
Take coumadin 5mg  tonight then resume 15mg  daily Recheck in 10 days

## 2017-11-13 ENCOUNTER — Inpatient Hospital Stay (HOSPITAL_COMMUNITY): Payer: Medicaid Other

## 2017-11-13 ENCOUNTER — Other Ambulatory Visit: Payer: Self-pay

## 2017-11-13 ENCOUNTER — Encounter (HOSPITAL_COMMUNITY): Payer: Self-pay

## 2017-11-13 VITALS — BP 133/64 | HR 73 | Temp 98.0°F | Resp 18

## 2017-11-13 DIAGNOSIS — D509 Iron deficiency anemia, unspecified: Secondary | ICD-10-CM | POA: Diagnosis not present

## 2017-11-13 DIAGNOSIS — D500 Iron deficiency anemia secondary to blood loss (chronic): Secondary | ICD-10-CM

## 2017-11-13 MED ORDER — SODIUM CHLORIDE 0.9 % IV SOLN
Freq: Once | INTRAVENOUS | Status: AC
  Administered 2017-11-13: 12:00:00 via INTRAVENOUS

## 2017-11-13 MED ORDER — SODIUM CHLORIDE 0.9 % IV SOLN
750.0000 mg | Freq: Once | INTRAVENOUS | Status: AC
  Administered 2017-11-13: 750 mg via INTRAVENOUS
  Filled 2017-11-13: qty 15

## 2017-11-13 NOTE — Progress Notes (Signed)
Injectafer given today per orders. Patient tolerated it well without problems. Vitals stable and discharged home from clinic ambulatory. Follow up as scheduled.  

## 2017-11-13 NOTE — Patient Instructions (Signed)
[name] Discharge Instructions     Thank you for choosing  [facility] at [facility] to provide your oncology and hematology care.  To afford each patient quality time with our provider, please arrive at least 15 minutes before your scheduled appointment time.   If you have a lab appointment with the Cancer Center please come in thru the  Main Entrance and check in at the main information desk  You need to re-schedule your appointment should you arrive 10 or more minutes late.  We strive to give you quality time with our providers, and arriving late affects you and other patients whose appointments are after yours.  Also, if you no show three or more times for appointments you may be dismissed from the clinic at the providers discretion.     Again, thank you for choosing [facility].  Our hope is that these requests will decrease the amount of time that you wait before being seen by our physicians.       _____________________________________________________________  Should you have questions after your visit to [facility], please contact our office at [phone] between the hours of 8:00 a.m. and 4:30 p.m.  Voicemails left after 4:00 p.m. will not be returned until the following business day.  For prescription refill requests, have your pharmacy contact our office and allow 72 hours.    Cancer Center Support Programs:   > Cancer Support Group  2nd Tuesday of the month 1pm-2pm, Journey Room    

## 2017-11-20 ENCOUNTER — Inpatient Hospital Stay (HOSPITAL_COMMUNITY): Payer: Medicaid Other

## 2017-11-20 ENCOUNTER — Encounter (HOSPITAL_COMMUNITY): Payer: Self-pay

## 2017-11-20 ENCOUNTER — Ambulatory Visit (INDEPENDENT_AMBULATORY_CARE_PROVIDER_SITE_OTHER): Payer: Medicaid Other | Admitting: *Deleted

## 2017-11-20 VITALS — BP 134/64 | HR 83 | Temp 98.8°F | Resp 18

## 2017-11-20 DIAGNOSIS — Z5181 Encounter for therapeutic drug level monitoring: Secondary | ICD-10-CM

## 2017-11-20 DIAGNOSIS — D500 Iron deficiency anemia secondary to blood loss (chronic): Secondary | ICD-10-CM

## 2017-11-20 DIAGNOSIS — Z86711 Personal history of pulmonary embolism: Secondary | ICD-10-CM

## 2017-11-20 DIAGNOSIS — D509 Iron deficiency anemia, unspecified: Secondary | ICD-10-CM | POA: Diagnosis not present

## 2017-11-20 LAB — POCT INR: INR: 2.9 (ref 2.0–3.0)

## 2017-11-20 MED ORDER — SODIUM CHLORIDE 0.9 % IV SOLN
Freq: Once | INTRAVENOUS | Status: AC
  Administered 2017-11-20: 15:00:00 via INTRAVENOUS

## 2017-11-20 MED ORDER — SODIUM CHLORIDE 0.9 % IV SOLN
750.0000 mg | Freq: Once | INTRAVENOUS | Status: AC
  Administered 2017-11-20: 750 mg via INTRAVENOUS
  Filled 2017-11-20: qty 15

## 2017-11-20 NOTE — Patient Instructions (Addendum)
Continue coumadin 15mg  daily Recheck in 3 weeks

## 2017-11-20 NOTE — Patient Instructions (Signed)
[facility] Discharge Instructions  Received Injectafer infusion today. Follow-up as scheduled. Call clinic for any questions or concerns   Thank you for choosing [facility] to provide your oncology and hematology care.  To afford each patient quality time with our provider, please arrive at least 15 minutes before your scheduled appointment time.   If you have a lab appointment with the Cancer Center please come in thru the  Main Entrance and check in at the main information desk  You need to re-schedule your appointment should you arrive 10 or more minutes late.  We strive to give you quality time with our providers, and arriving late affects you and other patients whose appointments are after yours.  Also, if you no show three or more times for appointments you may be dismissed from the clinic at the providers discretion.     Again, thank you for choosing [facility].  Our hope is that these requests will decrease the amount of time that you wait before being seen by our physicians.       _____________________________________________________________  Should you have questions after your visit to [facility], please contact our office at [phone] between the hours of 8:00 a.m. and 4:30 p.m.  Voicemails left after 4:00 p.m. will not be returned until the following business day.  For prescription refill requests, have your pharmacy contact our office and allow 72 hours.    Cancer Center Support Programs:   > Cancer Support Group  2nd Tuesday of the month 1pm-2pm, Journey Room   

## 2017-11-20 NOTE — Progress Notes (Signed)
[name] [name] tolerated Injectafer infusion well without complaints or incident. Peripheral IV site with positive blood return noted prior to and after infusion. VSS upon discharge. Pt discharged self ambulatory in satisfactory condition

## 2017-11-27 ENCOUNTER — Encounter: Payer: Self-pay | Admitting: Nurse Practitioner

## 2017-12-01 ENCOUNTER — Encounter: Payer: Self-pay | Admitting: Nurse Practitioner

## 2017-12-01 ENCOUNTER — Ambulatory Visit: Payer: Medicaid Other | Attending: Nurse Practitioner | Admitting: Nurse Practitioner

## 2017-12-01 ENCOUNTER — Other Ambulatory Visit: Payer: Self-pay

## 2017-12-01 VITALS — Ht 70.0 in | Wt 351.0 lb

## 2017-12-01 DIAGNOSIS — Z760 Encounter for issue of repeat prescription: Secondary | ICD-10-CM | POA: Diagnosis present

## 2017-12-01 DIAGNOSIS — Z8759 Personal history of other complications of pregnancy, childbirth and the puerperium: Secondary | ICD-10-CM | POA: Insufficient documentation

## 2017-12-01 DIAGNOSIS — Z8249 Family history of ischemic heart disease and other diseases of the circulatory system: Secondary | ICD-10-CM | POA: Diagnosis not present

## 2017-12-01 DIAGNOSIS — Z882 Allergy status to sulfonamides status: Secondary | ICD-10-CM | POA: Insufficient documentation

## 2017-12-01 DIAGNOSIS — Z79899 Other long term (current) drug therapy: Secondary | ICD-10-CM | POA: Insufficient documentation

## 2017-12-01 DIAGNOSIS — M25572 Pain in left ankle and joints of left foot: Secondary | ICD-10-CM | POA: Diagnosis not present

## 2017-12-01 DIAGNOSIS — G894 Chronic pain syndrome: Secondary | ICD-10-CM | POA: Insufficient documentation

## 2017-12-01 DIAGNOSIS — M5442 Lumbago with sciatica, left side: Secondary | ICD-10-CM | POA: Diagnosis not present

## 2017-12-01 DIAGNOSIS — Z95828 Presence of other vascular implants and grafts: Secondary | ICD-10-CM | POA: Insufficient documentation

## 2017-12-01 DIAGNOSIS — Z7901 Long term (current) use of anticoagulants: Secondary | ICD-10-CM | POA: Diagnosis not present

## 2017-12-01 DIAGNOSIS — F17210 Nicotine dependence, cigarettes, uncomplicated: Secondary | ICD-10-CM | POA: Diagnosis not present

## 2017-12-01 DIAGNOSIS — N920 Excessive and frequent menstruation with regular cycle: Secondary | ICD-10-CM | POA: Insufficient documentation

## 2017-12-01 DIAGNOSIS — Z79891 Long term (current) use of opiate analgesic: Secondary | ICD-10-CM | POA: Insufficient documentation

## 2017-12-01 DIAGNOSIS — M5441 Lumbago with sciatica, right side: Secondary | ICD-10-CM | POA: Insufficient documentation

## 2017-12-01 DIAGNOSIS — I82402 Acute embolism and thrombosis of unspecified deep veins of left lower extremity: Secondary | ICD-10-CM | POA: Insufficient documentation

## 2017-12-01 DIAGNOSIS — I10 Essential (primary) hypertension: Secondary | ICD-10-CM | POA: Insufficient documentation

## 2017-12-01 DIAGNOSIS — G8929 Other chronic pain: Secondary | ICD-10-CM | POA: Insufficient documentation

## 2017-12-01 MED ORDER — OXYCODONE-ACETAMINOPHEN 10-325 MG PO TABS: 1.0000 | ORAL_TABLET | Freq: Two times a day (BID) | ORAL | 0 refills | Status: AC | PRN

## 2017-12-01 MED ORDER — DULOXETINE HCL 30 MG PO CPEP: ORAL_CAPSULE | ORAL | 0 refills | Status: DC

## 2017-12-01 MED ORDER — OXYCODONE-ACETAMINOPHEN 10-325 MG PO TABS: 1.0000 | ORAL_TABLET | Freq: Two times a day (BID) | ORAL | 0 refills | Status: DC | PRN

## 2017-12-01 NOTE — Patient Instructions (Addendum)
Electronic prescriptions for Oxycodone (percocet) and Cymbalta.   DULoxetine (CYMBALTA) 30 MG capsule       Multiple Dosages: Starting Mon 12/01/2017, Last dose on Tue 12/30/2017,  THEN  Starting Wed 12/31/2017, Last dose on Sat 02/28/2018 Take 1 capsule (30 mg total) by mouth daily for 30 days,  THEN 2 capsules (60 mg total) daily.  oxyCODONE-acetaminophen (PERCOCET) 10-325 MG tablet  ________________________________________________________________________________________  Medication Rules  Applies to: All patients receiving prescriptions (written or electronic).  Pharmacy of record: Pharmacy where electronic prescriptions will be sent. If written prescriptions are taken to a different pharmacy, please inform the nursing staff. The pharmacy listed in the electronic medical record should be the one where you would like electronic prescriptions to be sent.  Prescription refills: Only during scheduled appointments. Applies to both, written and electronic prescriptions.  NOTE: The following applies primarily to controlled substances (Opioid* Pain Medications).   Patient's responsibilities: 1. Pain Pills: Bring all pain pills to every appointment (except for procedure appointments). 2. Pill Bottles: Bring pills in original pharmacy bottle. Always bring newest bottle. Bring bottle, even if empty. 3. Medication refills: You are responsible for knowing and keeping track of what medications you need refilled. The day before your appointment, write a list of all prescriptions that need to be refilled. Bring that list to your appointment and give it to the admitting nurse. Prescriptions will be written only during appointments. If you forget a medication, it will not be "Called in", "Faxed", or "electronically sent". You will need to get another appointment to get these prescribed. 4. Prescription Accuracy: You are responsible for carefully inspecting your prescriptions before leaving our office.  Have the discharge nurse carefully go over each prescription with you, before taking them home. Make sure that your name is accurately spelled, that your address is correct. Check the name and dose of your medication to make sure it is accurate. Check the number of pills, and the written instructions to make sure they are clear and accurate. Make sure that you are given enough medication to last until your next medication refill appointment. 5. Taking Medication: Take medication as prescribed. Never take more pills than instructed. Never take medication more frequently than prescribed. Taking less pills or less frequently is permitted and encouraged, when it comes to controlled substances (written prescriptions).  6. Inform other Doctors: Always inform, all of your healthcare providers, of all the medications you take. 7. Pain Medication from other Providers: You are not allowed to accept any additional pain medication from any other Doctor or Healthcare provider. There are two exceptions to this rule. (see below) In the event that you require additional pain medication, you are responsible for notifying us, as stated below. 8. Medication Agreement: You are responsible for carefully reading and following our Medication Agreement. This must be signed before receiving any prescriptions from our practice. Safely store a copy of your signed Agreement. Violations to the Agreement will result in no further prescriptions. (Additional copies of our Medication Agreement are available upon request.) 9. Laws, Rules, & Regulations: All patients are expected to follow all Federal and [employer], Rules, [employer]. Ignorance of the Laws does not constitute a valid excuse. The use of any illegal substances is prohibited. 10. Adopted CDC guidelines & recommendations: Target dosing levels will be at or below 60 MME/day. Use of benzodiazepines** is not recommended.  Exceptions: There are only two exceptions to  the rule of not receiving pain medications from other Healthcare Providers. 1.  Exception #1 (Emergencies): In the event of an emergency (i.e.: accident requiring emergency care), you are allowed to receive additional pain medication. However, you are responsible for: As soon as you are able, call our office [phone], at any time of the day or night, and leave a message stating your name, the date and nature of the emergency, and the name and dose of the medication prescribed. In the event that your call is answered by a member of our staff, make sure to document and save the date, time, and the name of the person that took your information.  2. Exception #2 (Planned Surgery): In the event that you are scheduled by another doctor or dentist to have any type of surgery or procedure, you are allowed (for a period no longer than 30 days), to receive additional pain medication, for the acute post-op pain. However, in this case, you are responsible for picking up a copy of our "Post-op Pain Management for Surgeons" handout, and giving it to your surgeon or dentist. This document is available at our office, and does not require an appointment to obtain it. Simply go to our office during business hours (Monday-Thursday from 8:00 AM to 4:00 PM) (Friday 8:00 AM to 12:00 Noon) or if you have a scheduled appointment with [place], prior to your surgery, and ask for it by name. In addition, you will need to provide [place] with your name, name of your surgeon, type of surgery, and date of procedure or surgery.  *Opioid medications include: morphine, codeine, oxycodone, oxymorphone, hydrocodone, hydromorphone, meperidine, tramadol, tapentadol, buprenorphine, fentanyl, methadone. **Benzodiazepine medications include: diazepam (Valium), alprazolam (Xanax), clonazepam (Klonopine), lorazepam (Ativan), clorazepate (Tranxene), chlordiazepoxide (Librium), estazolam (Prosom), oxazepam (Serax), temazepam (Restoril), triazolam  (Halcion) (Last updated: 03/20/2017) ____________________________________________________________________________________________   BMI Assessment: Estimated body mass index is 50.36 kg/m as calculated from the following:   Height as of this encounter: 5\' 10"  (1.778 m).   Weight as of this encounter: 351 lb (159.2 kg).  BMI interpretation table: BMI level Category Range association with higher incidence of chronic pain  <18 kg/m2 Underweight   18.5-24.9 kg/m2 Ideal body weight   25-29.9 kg/m2 Overweight Increased incidence by 20%  30-34.9 kg/m2 Obese (Class I) Increased incidence by 68%  35-39.9 kg/m2 Severe obesity (Class II) Increased incidence by 136%  >40 kg/m2 Extreme obesity (Class III) Increased incidence by 254%   Patient's current BMI Ideal Body weight  Body mass index is 50.36 kg/m. Ideal body weight: 68.5 kg (151 lb 0.2 oz) Adjusted ideal body weight: 104.8 kg (231 lb 0.2 oz)   BMI Readings from Last 4 Encounters:  12/01/17 50.36 kg/m  11/07/17 51.04 kg/m  10/02/17 51.80 kg/m  09/04/17 51.80 kg/m   Wt Readings from Last 4 Encounters:  12/01/17 (!) 351 lb (159.2 kg)  11/07/17 (!) 355 lb 11.2 oz (161.3 kg)  10/02/17 (!) 361 lb (163.7 kg)  09/04/17 (!) 361 lb (163.7 kg)

## 2017-12-01 NOTE — Progress Notes (Signed)
Nursing Pain Medication Assessment:  Safety precautions to be maintained throughout the outpatient stay will include: orient to surroundings, keep bed in low position, maintain call bell within reach at all times, provide assistance with transfer out of bed and ambulation.  Medication Inspection Compliance: Ms. Antwine did not comply with our request to bring her pills to be counted. She was reminded that bringing the medication bottles, even when empty, is a requirement.  Medication: None brought in. Pill/Patch Count: None available to be counted. Bottle Appearance: No container available. Did not bring bottle(s) to appointment. Filled Date: N/A Last Medication intake:  Yesterday. (Patient states she still has 6-7 pills left)

## 2017-12-01 NOTE — Progress Notes (Signed)
Patient's Name: [name] [name]  MRN: [id number]  Referring Provider: Caren Macadam, MD  DOB: February 04, 1975  PCP: Rosita Fire, MD  DOS: 12/01/2017  Note by: Vevelyn Francois NP  Service setting: Ambulatory outpatient  Specialty: Interventional Pain Management  Location: [facility] (AMB) Pain Management Facility    Patient type: Established    Primary Reason(s) for Visit: Encounter for prescription drug management. (Level of risk: moderate)  CC: Medication Refill and Back Pain  HPI  Ms. Parsell is a 42 y.o. year old, female patient, who comes today for a medication management evaluation. She has Pleuritic chest pain; SOB (shortness of breath); Hyperglycemia; Anemia; Tobacco use disorder; Saddle embolus of pulmonary artery with acute cor pulmonale ([place]); Secondary pulmonary hypertension; OSA on CPAP; Back pain with left-sided radiculopathy; Iron deficiency anemia; Benign essential hypertension; Morbid obesity ([place]); Uterine leiomyoma; Abdominal cramps; History of pulmonary embolism; Encounter for therapeutic drug monitoring; History of miscarriage; HTN (hypertension); Left leg DVT ([place]); Menorrhagia; S/P IVC filter; Bilateral pulmonary embolism ([place]); Chronic bilateral low back pain with bilateral sciatica; Chronic pain of left ankle; Chronic pain syndrome; and Long term current use of opiate analgesic on their problem list. Her primarily concern today is the Medication Refill and Back Pain  Pain Assessment: Location: Lower   Radiating: Radiates from lower back to legs bilateral down to feet Onset: More than a month ago Duration: Chronic pain Quality: Constant, Throbbing("piercing" ) Severity: 7 /10 (subjective, self-reported pain score)  Note: Reported level is compatible with observation. Clinically the patient looks like a 2/10 A 2/10 is viewed as "Mild to Moderate" and described as noticeable and distracting. Impossible to hide from other people. More frequent flare-ups. Still possible to adapt and  function close to normal. It can be very annoying and may have occasional stronger flare-ups. With discipline, patients may get used to it and adapt. Information on the proper use of the pain scale provided to the patient today. When using our objective Pain Scale, levels between 6 and 10/10 are said to belong in an emergency room, as it progressively worsens from a 6/10, described as severely limiting, requiring emergency care not usually available at an outpatient pain management facility. At a 6/10 level, communication becomes difficult and requires great effort. Assistance to reach the emergency department may be required. Facial flushing and profuse sweating along with potentially dangerous increases in heart rate and blood pressure will be evident. Effect on ADL: "When I am hurint sometimes I can not get off the bed".  Timing: Intermittent Modifying factors: Percocet, heat and cold packs BP:    HR:    Ms. Kolk was last scheduled for an appointment on Visit date not found for medication management. During today's appointment we reviewed Ms. Mack's chronic pain status, as well as her outpatient medication regimen. She admits that she has numbness and tingling in her legs. She has weakness in her legs. She denies any falls or injuries. She dnies any new concerns or questions.   The patient  reports that she does not use drugs. Her body mass index is 50.36 kg/m.  Further details on both, my assessment(s), as well as the proposed treatment plan, please see below.  Controlled Substance Pharmacotherapy Assessment REMS (Risk Evaluation and Mitigation Strategy)  PMP and historical list of controlled substances: Oxycodone 10 mg twice daily as needed MME/day: 30 mg/day Janne Napoleon, RN  12/01/2017 11:12 AM  Sign at close encounter Nursing Pain Medication Assessment:  Safety precautions to be maintained throughout the  outpatient stay will include: orient to surroundings, keep bed in low position,  maintain call bell within reach at all times, provide assistance with transfer out of bed and ambulation.  Medication Inspection Compliance: Ms. Stennis did not comply with our request to bring her pills to be counted. She was reminded that bringing the medication bottles, even when empty, is a requirement.  Medication: None brought in. Pill/Patch Count: None available to be counted. Bottle Appearance: No container available. Did not bring bottle(s) to appointment. Filled Date: N/A Last Medication intake:  Yesterday. (Patient states she still has 6-7 pills left)    Pharmacokinetics: Liberation and absorption (onset of action): WNL Distribution (time to peak effect): WNL Metabolism and excretion (duration of action): WNL         Pharmacodynamics: Desired effects: Analgesia: Ms. Montelongo reports >50% benefit. Functional ability: Patient reports that medication allows her to accomplish basic ADLs Clinically meaningful improvement in function (CMIF): Sustained CMIF goals met Perceived effectiveness: Described as relatively effective, allowing for increase in activities of daily living (ADL) Undesirable effects: Side-effects or Adverse reactions: None reported Monitoring: [place] PMP: Online review of the past 62-monthperiod conducted. Compliant with practice rules and regulations Last UDS on record: Summary  Date Value Ref Range Status  09/04/2017 FINAL  Final    Comment:    ==================================================================== TOXASSURE COMP DRUG ANALYSIS,UR ==================================================================== Test                             Result       Flag       Units Drug Absent but Declared for Prescription Verification   Oxycodone                      Not Detected UNEXPECTED ng/mg creat   Pregabalin                     Not Detected UNEXPECTED   Diclofenac                     Not Detected UNEXPECTED    Diclofenac, as indicated in the declared medication  list, is not    always detected even when used as directed. ==================================================================== Test                      Result    Flag   Units      Ref Range   Creatinine              45               mg/dL      >=20 ==================================================================== Declared Medications:  The flagging and interpretation on this report are based on the  following declared medications.  Unexpected results may arise from  inaccuracies in the declared medications.  **Note: The testing scope of this panel includes these medications:  Oxycodone  Pregabalin  **Note: The testing scope of this panel does not include small to  moderate amounts of these reported medications:  Diclofenac  **Note: The testing scope of this panel does not include following  reported medications:  Amlodipine Besylate  Hydrochlorothiazide (Triamterine-Hydrochlorthzide)  Lisinopril  Norethindrone  Triamterene (Triamterine-Hydrochlorthzide)  Warfarin ==================================================================== For clinical consultation, please call ([phone] ====================================================================    UDS interpretation: Compliant          Medication Assessment Form: Reviewed. Patient indicates being compliant with therapy Treatment compliance:  Compliant Risk Assessment Profile: Aberrant behavior: See prior evaluations. None observed or detected today Comorbid factors increasing risk of overdose: See prior notes. No additional risks detected today Opioid risk tool (ORT) (Total Score): 1 Personal History of Substance Abuse (SUD-Substance use disorder):  Alcohol: Negative  Illegal Drugs: Negative  Rx Drugs: Negative  ORT Risk Level calculation: Low Risk Risk of substance use disorder (SUD): Low Opioid Risk Tool - 12/01/17 1111      Family History of Substance Abuse   Alcohol  Negative    Illegal Drugs  Negative     Rx Drugs  Negative      Personal History of Substance Abuse   Alcohol  Negative    Illegal Drugs  Negative    Rx Drugs  Negative      Age   Age between 33-45 years   Yes      History of Preadolescent Sexual Abuse   History of Preadolescent Sexual Abuse  Negative or Female      Psychological Disease   Psychological Disease  Negative    Depression  Negative      Total Score   Opioid Risk Tool Scoring  1    Opioid Risk Interpretation  Low Risk      ORT Scoring interpretation table:  Score <3 = Low Risk for SUD  Score between 4-7 = Moderate Risk for SUD  Score >8 = High Risk for Opioid Abuse   Risk Mitigation Strategies:  Patient Counseling: Covered Patient-Prescriber Agreement (PPA): Present and active  Notification to other healthcare providers: Done  Pharmacologic Plan: No change in therapy, at this time.             Laboratory Chemistry  Inflammation Markers (CRP: Acute Phase) (ESR: Chronic Phase) Lab Results  Component Value Date   ESRSEDRATE 25 (H) 10/28/2013   LATICACIDVEN 6.2 (H) 04/27/2013                         Rheumatology Markers No results found for: RF, ANA, LABURIC, URICUR, LYMEIGGIGMAB, LYMEABIGMQN, HLAB27                      Renal Function Markers Lab Results  Component Value Date   BUN 16 10/31/2017   CREATININE 0.99 91/66/0600   BCR NOT APPLICABLE 45/99/7741   GFRAA >60 10/31/2017   GFRNONAA >60 10/31/2017                             Hepatic Function Markers Lab Results  Component Value Date   AST 15 10/31/2017   ALT 11 10/31/2017   ALBUMIN 3.8 10/31/2017   ALKPHOS 48 10/31/2017                        Electrolytes Lab Results  Component Value Date   NA 138 10/31/2017   K 3.9 10/31/2017   CL 105 10/31/2017   CALCIUM 9.0 10/31/2017                        Neuropathy Markers Lab Results  Component Value Date   VITAMINB12 307 05/19/2014   FOLATE 8.0 05/19/2014   HGBA1C 5.3 01/31/2017   HIV NON-REACTIVE 01/31/2017                         CNS Tests No results found  for: COLORCSF, APPEARCSF, RBCCOUNTCSF, WBCCSF, POLYSCSF, LYMPHSCSF, EOSCSF, PROTEINCSF, GLUCCSF, JCVIRUS, CSFOLI, IGGCSF                      Bone Pathology Markers No results found for: VD25OH, [id number], [id number], [id number], 25OHVITD1, 25OHVITD2, 25OHVITD3, TESTOFREE, TESTOSTERONE                       Coagulation Parameters Lab Results  Component Value Date   INR 2.9 11/20/2017   LABPROT 31.1 (H) 07/31/2017   APTT 54 (H) 02/17/2015   PLT 307 10/31/2017   DDIMER 0.76 (H) 01/27/2014                        Cardiovascular Markers Lab Results  Component Value Date   TROPONINI <0.30 04/27/2013   HGB 11.4 (L) 10/31/2017   HCT 38.0 10/31/2017                         CA Markers No results found for: CEA, CA125, LABCA2                      Note: Lab results reviewed.  Recent Diagnostic Imaging Results  ECHOCARDIOGRAM COMPLETE                    *[place],  [address]                            [phone]  ------------------------------------------------------------------- Transthoracic Echocardiography  Patient:    Precious, Segall MR #:       [id number] Study Date: 09/03/2017 Gender:     F Age:        41 Height:     177.8 cm Weight:     165.1 kg BSA:        2.95 m^2 Pt. Status: Room:   ATTENDING    Kerry Hough, M.D.  Berna Spare, M.D.  REFERRING    Kerry Hough, M.D.  PERFORMING   [facility], [facility] Na  SONOGRAPHER  Alvino Chapel, RCS  cc:  ------------------------------------------------------------------- LV EF: 60% -   65%  ------------------------------------------------------------------- Indications:      Pulmonary hypertension - secondary 416.8.  ------------------------------------------------------------------- History:   PMH:  Acquired from the patient and from the patient&'s chart.  PMH:   Long time anticoagulation Tobacco use disorder. Saddle embolus of pulmonary artery with acute cor pulmonale Obstructive sleep apnea. Morbid obesity  Risk factors: Hypertension.  ------------------------------------------------------------------- Study Conclusions  - Left ventricle: The cavity size was normal. Wall thickness was   increased in a pattern of mild LVH. Systolic function was normal.   The estimated ejection fraction was in the range of 60% to 65%.   Wall motion was normal; there were no regional wall motion   abnormalities. Left ventricular diastolic function parameters   were normal.  ------------------------------------------------------------------- Study data:  Comparison was made to the study of 04/28/2013.  Study status:  Routine.  Procedure:  The patient reported no pain pre or post test. Transthoracic echocardiography. Image  quality was adequate.  Study completion:  There were no complications. Transthoracic echocardiography.  M-mode, complete 2D, spectral Doppler, and color Doppler.  Birthdate:  Patient birthdate: 19-Jan-1976.  Age:  Patient is 42 yr old.  Sex:  Gender: female. BMI: 52.2 kg/m^2.  Blood pressure:     163/91  Patient status: Inpatient.  Study date:  Study date: 09/03/2017. Study time: 09:38 AM.  Location:  [facility].  -------------------------------------------------------------------  ------------------------------------------------------------------- Left ventricle:  The cavity size was normal. Wall thickness was increased in a pattern of mild LVH. Systolic function was normal. The estimated ejection fraction was in the range of 60% to 65%. Wall motion was normal; there were no regional wall motion abnormalities. The transmitral flow pattern was normal. The deceleration time of the early transmitral flow velocity was normal. The pulmonary vein flow pattern was normal. The tissue Doppler parameters were normal. Left ventricular diastolic  function parameters were normal.  ------------------------------------------------------------------- Aortic valve:   Trileaflet.  Doppler:   There was no stenosis. There was no regurgitation.    VTI ratio of LVOT to aortic valve: 0.73. Valve area (VTI): 2.52 cm^2. Indexed valve area (VTI): 0.85 cm^2/m^2. Peak velocity ratio of LVOT to aortic valve: 0.76. Valve area (Vmax): 2.64 cm^2. Indexed valve area (Vmax): 0.9 cm^2/m^2. Mean velocity ratio of LVOT to aortic valve: 0.75. Valve area (Vmean): 2.59 cm^2. Indexed valve area (Vmean): 0.88 cm^2/m^2. Mean gradient (S): 6 mm Hg. Peak gradient (S): 11 mm Hg.  ------------------------------------------------------------------- Aorta:  Aortic root: The aortic root was normal in size.  ------------------------------------------------------------------- Mitral valve:   Structurally normal valve.    Doppler:  There was no significant regurgitation.    Peak gradient (D): 3 mm Hg.  ------------------------------------------------------------------- Left atrium:  The atrium was normal in size.  ------------------------------------------------------------------- Atrial septum:  No defect or patent foramen ovale was identified.   ------------------------------------------------------------------- Right ventricle:  The cavity size was normal. Wall thickness was normal. Systolic function was normal.  ------------------------------------------------------------------- Pulmonic valve:    The valve appears to be grossly normal. Doppler:  There was no significant regurgitation.  ------------------------------------------------------------------- Tricuspid valve:   Structurally normal valve.    Doppler:  There was no significant regurgitation.  ------------------------------------------------------------------- Right atrium:  The atrium was normal in size.  ------------------------------------------------------------------- Pericardium:  There  was no pericardial effusion.  ------------------------------------------------------------------- Systemic veins: Inferior vena cava: The vessel was normal in size. The respirophasic diameter changes were in the normal range (>= 50%), consistent with normal central venous pressure.  ------------------------------------------------------------------- Measurements   Left ventricle                           Value          Reference  LV ID, ED, PLAX chordal                  50.2  mm       43 - 52  LV ID, ES, PLAX chordal                  32.7  mm       23 - 38  LV fx shortening, PLAX chordal           35    %        >=29  LV PW thickness, ED                      11.1  mm       ----------  IVS/LV PW ratio, ED                      1.05           <=1.3  Stroke volume, 2D                        90    ml       ----------  Stroke volume/bsa, 2D                    31    ml/m^2   ----------  LV ejection fraction, 1-p A4C            74    %        ----------  LV end-diastolic volume, 2-p             88    ml       ----------  LV end-systolic volume, 2-p              29    ml       ----------  LV ejection fraction, 2-p                67    %        ----------  Stroke volume, 2-p                       59    ml       ----------  LV end-diastolic volume/bsa, 2-p         30    ml/m^2   ----------  LV end-systolic volume/bsa, 2-p          10    ml/m^2   ----------  Stroke volume/bsa, 2-p                   19.9  ml/m^2   ----------  LV e&', lateral                           11.3  cm/s     ----------  LV E/e&', lateral                         7.37           ----------  LV e&', medial                            8.38  cm/s     ----------  LV E/e&', medial                          9.94           ----------  LV e&', average                           9.84  cm/s     ----------  LV E/e&', average                         8.47           ----------    Ventricular septum  Value           Reference  IVS thickness, ED                        11.6  mm       ----------    LVOT                                     Value          Reference  LVOT ID, S                               21    mm       ----------  LVOT area                                3.46  cm^2     ----------  LVOT peak velocity, S                    129   cm/s     ----------  LVOT mean velocity, S                    83.2  cm/s     ----------  LVOT VTI, S                              25.9  cm       ----------  LVOT peak gradient, S                    7     mm Hg    ----------    Aortic valve                             Value          Reference  Aortic valve peak velocity, S            169   cm/s     ----------  Aortic valve mean velocity, S            111   cm/s     ----------  Aortic valve VTI, S                      35.6  cm       ----------  Aortic mean gradient, S                  6     mm Hg    ----------  Aortic peak gradient, S                  11    mm Hg    ----------  VTI ratio, LVOT/AV                       0.73           ----------  Aortic valve area, VTI                   2.52  cm^2     ----------  Aortic valve  area/bsa, VTI               0.85  cm^2/m^2 ----------  Velocity ratio, peak, LVOT/AV            0.76           ----------  Aortic valve area, peak velocity         2.64  cm^2     ----------  Aortic valve area/bsa, peak              0.9   cm^2/m^2 ----------  velocity  Velocity ratio, mean, LVOT/AV            0.75           ----------  Aortic valve area, mean velocity         2.59  cm^2     ----------  Aortic valve area/bsa, mean              0.88  cm^2/m^2 ----------  velocity    Aorta                                    Value          Reference  Aortic root ID, ED                       30    mm       ----------    Left atrium                              Value          Reference  LA ID, A-P, ES                           43    mm       ----------  LA ID/bsa, A-P                            1.46  cm/m^2   <=2.2  LA volume, S                             65.6  ml       ----------  LA volume/bsa, S                         22.3  ml/m^2   ----------  LA volume, ES, 1-p A4C                   58.5  ml       ----------  LA volume/bsa, ES, 1-p A4C               19.8  ml/m^2   ----------  LA volume, ES, 1-p A2C                   71.5  ml       ----------  LA volume/bsa, ES, 1-p A2C               24.3  ml/m^2   ----------    Mitral valve  Value          Reference  Mitral E-wave peak velocity              83.3  cm/s     ----------  Mitral A-wave peak velocity              67.5  cm/s     ----------  Mitral deceleration time                 201   ms       150 - 230  Mitral peak gradient, D                  3     mm Hg    ----------  Mitral E/A ratio, peak                   1.2            ----------    Right atrium                             Value          Reference  RA ID, S-I, ES, A4C              (H)     53.4  mm       34 - 49  RA area, ES, A4C                         14.2  cm^2     8.3 - 19.5  RA volume, ES, A/L                       31.8  ml       ----------  RA volume/bsa, ES, A/L                   10.8  ml/m^2   ----------    Systemic veins                           Value          Reference  Estimated CVP                            3     mm Hg    ----------    Right ventricle                          Value          Reference  RV ID, ED, PLAX                          26.3  mm       19 - 38  TAPSE                                    21.2  mm       ----------  RV s&', lateral, S                        13.4  cm/s     ----------  Legend: (L)  and  (H)  mark values outside specified reference range.  ------------------------------------------------------------------- Prepared and Electronically Authenticated by  Kate Sable, MD 2019-08-14T10:29:35  Complexity Note: Imaging results reviewed. Results shared with Ms. Daffin, using Layman's terms.                          Meds   Current Outpatient Medications:  .  amLODipine (NORVASC) 10 MG tablet, Take 1 tablet (10 mg total) by mouth daily., Disp: 90 tablet, Rfl: 3 .  DULoxetine (CYMBALTA) 30 MG capsule, Take 1 capsule (30 mg total) by mouth daily for 30 days, THEN 2 capsules (60 mg total) daily., Disp: 150 capsule, Rfl: 0 .  lisinopril (PRINIVIL,ZESTRIL) 40 MG tablet, Take 1 tablet (40 mg total) by mouth daily., Disp: 90 tablet, Rfl: 3 .  norethindrone (MICRONOR,CAMILA,ERRIN) 0.35 MG tablet, TAKE ONE TABLET BY MOUTH ONCE DAILY, Disp: 28 tablet, Rfl: 3 .  oxyCODONE-acetaminophen (PERCOCET) 10-325 MG tablet, Take 1 tablet by mouth 2 (two) times daily as needed for pain., Disp: 60 tablet, Rfl: 0 .  triamterene-hydrochlorothiazide (MAXZIDE-25) 37.5-25 MG tablet, TAKE 1 TABLET BY MOUTH ONCE A DAY., Disp: 30 tablet, Rfl: 3 .  warfarin (COUMADIN) 10 MG tablet, Take 1 tablet (10 mg total) by mouth daily., Disp: 30 tablet, Rfl: 1 .  warfarin (COUMADIN) 2.5 MG tablet, Take 2 tablets (78m) daily along with the 125mtablet for a total of 1565maily, Disp: 60 tablet, Rfl: 1 .  [START ON 12/31/2017] oxyCODONE-acetaminophen (PERCOCET) 10-325 MG tablet, Take 1 tablet by mouth 2 (two) times daily as needed for pain., Disp: 60 tablet, Rfl: 0 No current facility-administered medications for this visit.   Facility-Administered Medications Ordered in Other Visits:  .  0.9 %  sodium chloride infusion, , Intravenous, Continuous, [place], ThoManon HildingA-C, Last Rate: 20 mL/hr at 12/22/15 1430  ROS  Constitutional: Denies any fever or chills Gastrointestinal: No reported hemesis, hematochezia, vomiting, or acute GI distress Musculoskeletal: Denies any acute onset joint swelling, redness, loss of ROM, or weakness Neurological: No reported episodes of acute onset apraxia, aphasia, dysarthria, agnosia, amnesia, paralysis, loss of coordination, or loss of consciousness  Allergies  Ms. SmiHossain allergic to sulfa  antibiotics.  PFSH  Drug: Ms. SmiIzardeports that she does not use drugs. Alcohol:  reports that she drinks about 2.0 standard drinks of alcohol per week. Tobacco:  reports that she has been smoking cigarettes. She has a 4.50 pack-year smoking history. She has never used smokeless tobacco. Medical:  has a past medical history of Abdominal cramps (08/30/2015), Anemia, Blood transfusion without reported diagnosis, Closed left ankle fracture, Clotting disorder ([place]T (deep venous thrombosis) ([place] tumor, Hidradenitis, High blood pressure, Hypertension, Obesity, OSA on CPAP, Oxygen deficiency, Pregnancy, Pulmonary embolism ([place]), Sleep apnea, and Tobacco use disorder. Surgical: Ms. SmiSpadeas a past surgical history that includes Cyst removal trunk; Abscess drainage (Bilateral, 01/10/15); and IVC FILTER PLACEMENT ([facility]). Family: family history includes Cancer in her maternal grandmother; Diabetes in her paternal aunt and paternal uncle; Heart attack in her paternal grandmother; Hypertension in her mother; Other in her father.  Constitutional Exam  General appearance: Well nourished, well developed, and well hydrated. In no apparent acute distress Vitals:   12/01/17 1106  Weight: (!) 351 lb (159.2 kg)  Height: 5' 10"  (1.778 m)  Psych/Mental status: Alert, oriented x 3 (person, place, & time)       Eyes: PERLA Respiratory:  No evidence of acute respiratory distress  Gait & Posture Assessment  Ambulation: Unassisted Gait: Relatively normal for age and body habitus Posture: WNL   Lower Extremity Exam    Side: Right lower extremity  Side: Left lower extremity  Stability: No instability observed          Stability: No instability observed          Skin & Extremity Inspection: Skin color, temperature, and hair growth are WNL. No peripheral edema or cyanosis. No masses, redness, swelling, asymmetry, or associated skin lesions. No contractures.  Skin & Extremity Inspection: Edema   Functional ROM: Unrestricted ROM                  Functional ROM: Unrestricted ROM                  Muscle Tone/Strength: Functionally intact. No obvious neuro-muscular anomalies detected.  Muscle Tone/Strength: Functionally intact. No obvious neuro-muscular anomalies detected.  Sensory (Neurological): Unimpaired  Sensory (Neurological): Unimpaired  Palpation: No palpable anomalies  Palpation: No palpable anomalies   Assessment  Primary Diagnosis & Pertinent Problem List: The primary encounter diagnosis was Chronic bilateral low back pain with bilateral sciatica. Diagnoses of Chronic pain of left ankle, Chronic pain syndrome, and Long term current use of opiate analgesic were also pertinent to this visit.  Status Diagnosis  Controlled Controlled Controlled 1. Chronic bilateral low back pain with bilateral sciatica   2. Chronic pain of left ankle   3. Chronic pain syndrome   4. Long term current use of opiate analgesic     Problems updated and reviewed during this visit: Problem  Chronic Bilateral Low Back Pain With Bilateral Sciatica  Chronic Pain of Left Ankle  Chronic Pain Syndrome  History of Miscarriage   Overview:  At 9 weeks and 6 weeks   Htn (Hypertension)  Left Leg Dvt (Hcc)   Overview:  Unprovoked LLE popliteal DVT   Menorrhagia  S/P Ivc Filter  Long Term Current Use of Opiate Analgesic  Uterine Leiomyoma  Iron Deficiency Anemia  Osa On Cpap  Bilateral Pulmonary Embolism (Hcc)   Plan of Care  Pharmacotherapy (Medications Ordered): Meds ordered this encounter  Medications  . DULoxetine (CYMBALTA) 30 MG capsule    Sig: Take 1 capsule (30 mg total) by mouth daily for 30 days, THEN 2 capsules (60 mg total) daily.    Dispense:  150 capsule    Refill:  0    Order Specific Question:   Supervising Provider    Answer:   Milinda Pointer [phone]  . oxyCODONE-acetaminophen (PERCOCET) 10-325 MG tablet    Sig: Take 1 tablet by mouth 2 (two) times daily as needed  for pain.    Dispense:  60 tablet    Refill:  0    Do not place this medication, or any other prescription from our practice, on "Automatic Refill". Patient may have prescription filled one day early if pharmacy is closed on scheduled refill date.    Order Specific Question:   Supervising Provider    Answer:   Milinda Pointer [phone]  . oxyCODONE-acetaminophen (PERCOCET) 10-325 MG tablet    Sig: Take 1 tablet by mouth 2 (two) times daily as needed for pain.    Dispense:  60 tablet    Refill:  0    Do not place this medication, or any other prescription from our practice, on "Automatic Refill". Patient may have prescription filled one day early if pharmacy is closed on scheduled  refill date.    Order Specific Question:   Supervising Provider    Answer:   Milinda Pointer [phone]   New Prescriptions   OXYCODONE-ACETAMINOPHEN (PERCOCET) 10-325 MG TABLET    Take 1 tablet by mouth 2 (two) times daily as needed for pain.   Medications administered today: Berania R. Coley had no medications administered during this visit. Lab-work, procedure(s), and/or referral(s): No orders of the defined types were placed in this encounter.  Imaging and/or referral(s): None  Interventional therapies: Planned, scheduled, and/or pending:   Not at this time.   Provider-requested follow-up: Return in about 2 months (around 01/31/2018) for MedMgmt.  Future Appointments  Date Time Provider [place]  12/09/2017 10:15 AM [facility]  01/01/2018 11:10 AM [facility] None  01/01/2018  1:40 PM Higgs, Mathis Dad, MD [facility] None  01/28/2018 10:45 AM Vevelyn Francois, NP [facility] None   Primary Care Physician: Rosita Fire, MD [facility] Note by: Vevelyn Francois NP Date: 12/01/2017; Time: 3:13 PM  Pain Score Disclaimer: We use the NRS-11 scale. This is a self-reported, subjective measurement of pain severity with only  modest accuracy. It is used primarily to identify changes within a particular patient. It must be understood that outpatient pain scales are significantly less accurate that those used for research, where they can be applied under ideal controlled circumstances with minimal exposure to variables. In reality, the score is likely to be a combination of pain intensity and pain affect, where pain affect describes the degree of emotional arousal or changes in action readiness caused by the sensory experience of pain. Factors such as social and work situation, setting, emotional state, anxiety levels, expectation, and prior pain experience may influence pain perception and show large inter-individual differences that may also be affected by time variables.  Patient instructions provided during this appointment: Patient Instructions   Electronic prescriptions for Oxycodone (percocet) and Cymbalta.   DULoxetine (CYMBALTA) 30 MG capsule       Multiple Dosages: Starting Mon 12/01/2017, Last dose on Tue 12/30/2017,  THEN  Starting Wed 12/31/2017, Last dose on Sat 02/28/2018 Take 1 capsule (30 mg total) by mouth daily for 30 days,  THEN 2 capsules (60 mg total) daily.  oxyCODONE-acetaminophen (PERCOCET) 10-325 MG tablet  ________________________________________________________________________________________  Medication Rules  Applies to: All patients receiving prescriptions (written or electronic).  Pharmacy of record: Pharmacy where electronic prescriptions will be sent. If written prescriptions are taken to a different pharmacy, please inform the nursing staff. The pharmacy listed in the electronic medical record should be the one where you would like electronic prescriptions to be sent.  Prescription refills: Only during scheduled appointments. Applies to both, written and electronic prescriptions.  NOTE: The following applies primarily to controlled substances (Opioid* Pain Medications).    Patient's responsibilities: 1. Pain Pills: Bring all pain pills to every appointment (except for procedure appointments). 2. Pill Bottles: Bring pills in original pharmacy bottle. Always bring newest bottle. Bring bottle, even if empty. 3. Medication refills: You are responsible for knowing and keeping track of what medications you need refilled. The day before your appointment, write a list of all prescriptions that need to be refilled. Bring that list to your appointment and give it to the admitting nurse. Prescriptions will be written only during appointments. If you forget a medication, it will not be "Called in", "Faxed", or "electronically sent". You will need to get another appointment to get these prescribed. 4. Prescription Accuracy: You  are responsible for carefully inspecting your prescriptions before leaving our office. Have the discharge nurse carefully go over each prescription with you, before taking them home. Make sure that your name is accurately spelled, that your address is correct. Check the name and dose of your medication to make sure it is accurate. Check the number of pills, and the written instructions to make sure they are clear and accurate. Make sure that you are given enough medication to last until your next medication refill appointment. 5. Taking Medication: Take medication as prescribed. Never take more pills than instructed. Never take medication more frequently than prescribed. Taking less pills or less frequently is permitted and encouraged, when it comes to controlled substances (written prescriptions).  6. Inform other Doctors: Always inform, all of your healthcare providers, of all the medications you take. 7. Pain Medication from other Providers: You are not allowed to accept any additional pain medication from any other Doctor or Healthcare provider. There are two exceptions to this rule. (see below) In the event that you require additional pain medication, you  are responsible for notifying us, as stated below. 8. Medication Agreement: You are responsible for carefully reading and following our Medication Agreement. This must be signed before receiving any prescriptions from our practice. Safely store a copy of your signed Agreement. Violations to the Agreement will result in no further prescriptions. (Additional copies of our Medication Agreement are available upon request.) 9. Laws, Rules, & Regulations: All patients are expected to follow all Federal and [employer], [employer], Rules, [employer]. Ignorance of the Laws does not constitute a valid excuse. The use of any illegal substances is prohibited. 10. Adopted CDC guidelines & recommendations: Target dosing levels will be at or below 60 MME/day. Use of benzodiazepines** is not recommended.  Exceptions: There are only two exceptions to the rule of not receiving pain medications from other Healthcare Providers. 1. Exception #1 (Emergencies): In the event of an emergency (i.e.: accident requiring emergency care), you are allowed to receive additional pain medication. However, you are responsible for: As soon as you are able, call our office [phone], at any time of the day or night, and leave a message stating your name, the date and nature of the emergency, and the name and dose of the medication prescribed. In the event that your call is answered by a member of our staff, make sure to document and save the date, time, and the name of the person that took your information.  2. Exception #2 (Planned Surgery): In the event that you are scheduled by another doctor or dentist to have any type of surgery or procedure, you are allowed (for a period no longer than 30 days), to receive additional pain medication, for the acute post-op pain. However, in this case, you are responsible for picking up a copy of our "Post-op Pain Management for Surgeons" handout, and giving it to your surgeon or dentist. This document  is available at our office, and does not require an appointment to obtain it. Simply go to our office during business hours (Monday-Thursday from 8:00 AM to 4:00 PM) (Friday 8:00 AM to 12:00 Noon) or if you have a scheduled appointment with [place], prior to your surgery, and ask for it by name. In addition, you will need to provide [place] with your name, name of your surgeon, type of surgery, and date of procedure or surgery.  *Opioid medications include: morphine, codeine, oxycodone, oxymorphone, hydrocodone, hydromorphone, meperidine, tramadol, tapentadol, buprenorphine, fentanyl, methadone. **  Benzodiazepine medications include: diazepam (Valium), alprazolam (Xanax), clonazepam (Klonopine), lorazepam (Ativan), clorazepate (Tranxene), chlordiazepoxide (Librium), estazolam (Prosom), oxazepam (Serax), temazepam (Restoril), triazolam (Halcion) (Last updated: 03/20/2017) ____________________________________________________________________________________________   BMI Assessment: Estimated body mass index is 50.36 kg/m as calculated from the following:   Height as of this encounter: 5' 10"  (1.778 m).   Weight as of this encounter: 351 lb (159.2 kg).  BMI interpretation table: BMI level Category Range association with higher incidence of chronic pain  <18 kg/m2 Underweight   18.5-24.9 kg/m2 Ideal body weight   25-29.9 kg/m2 Overweight Increased incidence by 20%  30-34.9 kg/m2 Obese (Class I) Increased incidence by 68%  35-39.9 kg/m2 Severe obesity (Class II) Increased incidence by 136%  >40 kg/m2 Extreme obesity (Class III) Increased incidence by 254%   Patient's current BMI Ideal Body weight  Body mass index is 50.36 kg/m. Ideal body weight: 68.5 kg (151 lb 0.2 oz) Adjusted ideal body weight: 104.8 kg (231 lb 0.2 oz)   BMI Readings from Last 4 Encounters:  12/01/17 50.36 kg/m  11/07/17 51.04 kg/m  10/02/17 51.80 kg/m  09/04/17 51.80 kg/m   Wt Readings from Last 4 Encounters:  12/01/17 (!)  351 lb (159.2 kg)  11/07/17 (!) 355 lb 11.2 oz (161.3 kg)  10/02/17 (!) 361 lb (163.7 kg)  09/04/17 (!) 361 lb (163.7 kg)

## 2017-12-09 ENCOUNTER — Ambulatory Visit (INDEPENDENT_AMBULATORY_CARE_PROVIDER_SITE_OTHER): Payer: Medicaid Other | Admitting: *Deleted

## 2017-12-09 DIAGNOSIS — Z86711 Personal history of pulmonary embolism: Secondary | ICD-10-CM

## 2017-12-09 DIAGNOSIS — Z5181 Encounter for therapeutic drug level monitoring: Secondary | ICD-10-CM

## 2017-12-09 DIAGNOSIS — I2782 Chronic pulmonary embolism: Secondary | ICD-10-CM

## 2017-12-09 DIAGNOSIS — I2602 Saddle embolus of pulmonary artery with acute cor pulmonale: Secondary | ICD-10-CM

## 2017-12-09 LAB — POCT INR: INR: 2.4 (ref 2.0–3.0)

## 2017-12-09 MED ORDER — WARFARIN SODIUM 10 MG PO TABS: 10.0000 mg | ORAL_TABLET | Freq: Every day | ORAL | 3 refills | Status: DC

## 2017-12-09 NOTE — Patient Instructions (Signed)
Continue coumadin 15mg  daily Recheck in 4 weeks

## 2017-12-29 ENCOUNTER — Inpatient Hospital Stay (HOSPITAL_COMMUNITY): Payer: Medicaid Other | Attending: Internal Medicine

## 2017-12-29 DIAGNOSIS — Z720 Tobacco use: Secondary | ICD-10-CM | POA: Diagnosis not present

## 2017-12-29 DIAGNOSIS — N920 Excessive and frequent menstruation with regular cycle: Secondary | ICD-10-CM | POA: Insufficient documentation

## 2017-12-29 DIAGNOSIS — I2692 Saddle embolus of pulmonary artery without acute cor pulmonale: Secondary | ICD-10-CM | POA: Diagnosis not present

## 2017-12-29 DIAGNOSIS — D509 Iron deficiency anemia, unspecified: Secondary | ICD-10-CM | POA: Insufficient documentation

## 2017-12-29 DIAGNOSIS — I10 Essential (primary) hypertension: Secondary | ICD-10-CM | POA: Diagnosis not present

## 2017-12-29 DIAGNOSIS — D500 Iron deficiency anemia secondary to blood loss (chronic): Secondary | ICD-10-CM

## 2017-12-29 LAB — CBC WITH DIFFERENTIAL/PLATELET
Abs Immature Granulocytes: 0.03 10*3/uL (ref 0.00–0.07)
BASOS ABS: 0.1 10*3/uL (ref 0.0–0.1)
BASOS PCT: 1 %
Eosinophils Absolute: 0.2 10*3/uL (ref 0.0–0.5)
Eosinophils Relative: 3 %
HCT: 39 % (ref 36.0–46.0)
Hemoglobin: 12.1 g/dL (ref 12.0–15.0)
Immature Granulocytes: 0 %
LYMPHS PCT: 19 %
Lymphs Abs: 1.4 10*3/uL (ref 0.7–4.0)
MCH: 28.3 pg (ref 26.0–34.0)
MCHC: 31 g/dL (ref 30.0–36.0)
MCV: 91.1 fL (ref 80.0–100.0)
MONO ABS: 0.4 10*3/uL (ref 0.1–1.0)
MONOS PCT: 6 %
NEUTROS ABS: 4.9 10*3/uL (ref 1.7–7.7)
NEUTROS PCT: 71 %
PLATELETS: 311 10*3/uL (ref 150–400)
RBC: 4.28 MIL/uL (ref 3.87–5.11)
RDW: 22.5 % — ABNORMAL HIGH (ref 11.5–15.5)
WBC: 7 10*3/uL (ref 4.0–10.5)
nRBC: 0 % (ref 0.0–0.2)

## 2017-12-29 LAB — COMPREHENSIVE METABOLIC PANEL
ALT: 12 U/L (ref 0–44)
ANION GAP: 6 (ref 5–15)
AST: 14 U/L — ABNORMAL LOW (ref 15–41)
Albumin: 3.8 g/dL (ref 3.5–5.0)
Alkaline Phosphatase: 66 U/L (ref 38–126)
BUN: 17 mg/dL (ref 6–20)
CHLORIDE: 103 mmol/L (ref 98–111)
CO2: 28 mmol/L (ref 22–32)
Calcium: 9.2 mg/dL (ref 8.9–10.3)
Creatinine, Ser: 0.86 mg/dL (ref 0.44–1.00)
GFR calc non Af Amer: >60 mL/min (ref >60)
Glucose, Bld: 98 mg/dL (ref 70–99)
Potassium: 4.3 mmol/L (ref 3.5–5.1)
SODIUM: 137 mmol/L (ref 135–145)
Total Bilirubin: 0.2 mg/dL — ABNORMAL LOW (ref 0.3–1.2)
Total Protein: 8.4 g/dL — ABNORMAL HIGH (ref 6.5–8.1)

## 2017-12-29 LAB — FERRITIN: FERRITIN: 133 ng/mL (ref 11–307)

## 2017-12-29 LAB — LACTATE DEHYDROGENASE: LDH: 102 U/L (ref 98–192)

## 2017-12-30 ENCOUNTER — Other Ambulatory Visit (HOSPITAL_COMMUNITY): Payer: Self-pay | Admitting: Internal Medicine

## 2017-12-30 ENCOUNTER — Inpatient Hospital Stay (HOSPITAL_BASED_OUTPATIENT_CLINIC_OR_DEPARTMENT_OTHER): Payer: Medicaid Other | Admitting: Internal Medicine

## 2017-12-30 ENCOUNTER — Encounter (HOSPITAL_COMMUNITY): Payer: Self-pay | Admitting: Internal Medicine

## 2017-12-30 ENCOUNTER — Other Ambulatory Visit (HOSPITAL_COMMUNITY): Payer: Self-pay

## 2017-12-30 ENCOUNTER — Ambulatory Visit (HOSPITAL_COMMUNITY): Payer: Self-pay | Admitting: Internal Medicine

## 2017-12-30 ENCOUNTER — Other Ambulatory Visit: Payer: Self-pay

## 2017-12-30 VITALS — BP 118/66 | HR 82 | Temp 98.0°F | Resp 14 | Wt 352.4 lb

## 2017-12-30 DIAGNOSIS — D509 Iron deficiency anemia, unspecified: Secondary | ICD-10-CM | POA: Diagnosis not present

## 2017-12-30 DIAGNOSIS — I10 Essential (primary) hypertension: Secondary | ICD-10-CM | POA: Diagnosis not present

## 2017-12-30 DIAGNOSIS — D500 Iron deficiency anemia secondary to blood loss (chronic): Secondary | ICD-10-CM

## 2017-12-30 DIAGNOSIS — I2692 Saddle embolus of pulmonary artery without acute cor pulmonale: Secondary | ICD-10-CM

## 2017-12-30 DIAGNOSIS — Z720 Tobacco use: Secondary | ICD-10-CM

## 2017-12-30 DIAGNOSIS — N920 Excessive and frequent menstruation with regular cycle: Secondary | ICD-10-CM | POA: Diagnosis not present

## 2017-12-30 NOTE — Progress Notes (Signed)
Diagnosis No diagnosis found.  Staging Cancer Staging No matching staging information was found for the patient.  Assessment and Plan:  1.  Saddle PE.  Patient should continue to follow-up with coumadin clinic for monitoring.    2. Iron deficiency anemia.  Pt was last treated with IV iron 11/20/2017.  Labs done 12/29/2017 reviewed and showed WBC 7.8 HB 12.1 plts 311,000.  Chemistries WNL with K+ 4.3 Cr 0.86 and normal LFTs. Ferritin improved at 133.   Pt is referred to GI for evaluation due to IDA.  Pt will be seen for follow-up in 06/2018 with labs.    2.  Menorrhagia.  She is advised to follow-up with GYN.    3.  Smoking.  Cessation is recommended especially due to history of thrombosis.  She reports she is cutting back.    4.  HTN.  BP is 118/66. Follow-up with PCP.    INTERVAL HISTORY:  Historical data obtained from note dated 04/22/2017.  42 year old female with saddle PE, unprovoked with CT evidence of right strain and clot extending from the distal main pulmonary artery to the left and right pulmonary artery extension into the LUL, LLL and lingula branches. She was hospitalized for right heart strain and had IVC filter placement on 04/2013.  She was seen at [facility] coagulation clinic in March 2017 for lifelong anticoagulation. She was treated in the past with lovenox bridge for coumadin.  She is currently on coumadin.   Pt also has a history of Iron deficiency anemia and was treated with  IV iron replacement therapy.  She had a second opinion at [facility]: Patient Instructions - [name], MD - 04/18/2015 9:20 AM EDT You should continue on blood thinners long term due to your previous unprovoked clots, especially given the severe nature of your lung clots. Similar to my recommendations at your initial visit, I would not recommend transitioning to one of the new oral anticoagulants like Xarelto at this time. These medications have not been studied well in individuals with weights >~250  pounds, so we don't know how effective they are in this population. As such, if you wish to change from the Lovenox shots to an oral medication, I would recommend warfarin or Coumadin. You would start this and overlap it with Lovenox until your Coumadin levels are in the therapeutic range. This therapeutic range is an INR of 2-3. You will need to have someone locally to follow your INR levels and adjust Coumadin doses as needed. This is typically done by a primary care doctor, although Dr. Whitney Muse may be able to assist as well. You should be mindful of having stable intake of vitamin K containing foods while on Coumadin. You do not need to eliminate them from your diet but just eat a similar amount every week. Otherwise it will make keeping your Coumadin levels in the therapeutic range of difficult task. I anticipate having data in the next few years on the use of the new oral anticoagulants at the extremes of the body weight spectrum, so likely we can transitioning to one of these medicines in the future.  Regarding your left leg pain symptoms, I imagine it is due to something called post-thrombotic syndrome. The main treatment for this is to wear compression stockings. Try the over-the-counter type first and if no improvement then you can fill the prescription provided today for medical grade stockings. If ultimately you cannot tolerate the stockings or they do not improve symptoms significantly, then there is  no need to continue wearing them. I recommend that you avoid any estrogen containing systemic hormonal preparations. The safest hormonal option for birth control is the [place] or [place] IUDs. The next safest options are the progesterone only pills or other progesterone only preparations. Taking them while on anticoagulation is likely safe, but again I would recommend avoiding estrogen containing preparations. If you were to become pregnant again in the future, you would need to be treated with Lovenox  throughout the pregnancy like before.  For now, lets plan to see back here every 1-2 years to reassess the decision for long-term blood thinners and if there are alternative options available to you. I'm happy to see her sooner if new questions or problems.  Current Status:  Pt is seen today for follow-up to go over labs.  She remains on coumadin.  She reports occasional heavy cycles.     Problem List Patient Active Problem List   Diagnosis Date Noted  . History of miscarriage [Z87.59] 12/01/2017  . HTN (hypertension) [I10] 12/01/2017  . Left leg DVT ([place]) [I82.402] 12/01/2017  . Menorrhagia [N92.0] 12/01/2017  . S/P IVC filter [Z95.828] 12/01/2017  . Chronic bilateral low back pain with bilateral sciatica [M54.42, M54.41, G89.29] 12/01/2017  . Chronic pain of left ankle [M25.572, G89.29] 12/01/2017  . Chronic pain syndrome [G89.4] 12/01/2017  . Long term current use of opiate analgesic [Z79.891] 12/01/2017  . Encounter for therapeutic drug monitoring [Z51.81] 09/03/2017  . History of pulmonary embolism [Z86.711] 08/27/2017  . Abdominal cramps [R10.9] 08/30/2015  . Morbid obesity ([place]) [E66.01] 08/18/2014  . Uterine leiomyoma [D25.9] 08/18/2014  . Benign essential hypertension [I10] 07/21/2014  . Iron deficiency anemia [D50.9] 02/05/2014  . Back pain with left-sided radiculopathy [M54.10] 10/31/2013  . Secondary pulmonary hypertension [IMO0002] 07/06/2013  . OSA on CPAP [G47.33, Z99.89] 07/06/2013  . Pleuritic chest pain [R07.81] 04/27/2013  . SOB (shortness of breath) [R06.02] 04/27/2013  . Hyperglycemia [R73.9] 04/27/2013  . Anemia [D64.9] 04/27/2013  . Tobacco use disorder [F17.200] 04/27/2013  . Saddle embolus of pulmonary artery with acute cor pulmonale ([place]) [I26.02] 04/27/2013  . Bilateral pulmonary embolism ([place]) [I26.99] 04/27/2013    Past Medical History Past Medical History:  Diagnosis Date  . Abdominal cramps 08/30/2015  . Anemia   . Blood transfusion without  reported diagnosis   . Closed left ankle fracture    August 30 2012  . Clotting disorder ([place])   . DVT (deep venous thrombosis) ([facility])    L leg  . Fibroid tumor   . Hidradenitis   . High blood pressure   . Hypertension   . Obesity   . OSA on CPAP   . Oxygen deficiency   . Pregnancy    09/20/14 17 weeks  . Pulmonary embolism ([place]) 04/2013  . Sleep apnea   . Tobacco use disorder     Past Surgical History Past Surgical History:  Procedure Laterality Date  . ABSCESS DRAINAGE Bilateral 01/10/15  . CYST REMOVAL TRUNK    . IVC FILTER PLACEMENT ([facility] HX)      Family History Family History  Problem Relation Age of Onset  . Hypertension Mother   . Diabetes Paternal Aunt   . Diabetes Paternal Uncle   . Other Father        blood clots  . Cancer Maternal Grandmother   . Heart attack Paternal Grandmother      Social History  reports that she has been smoking cigarettes. She has a 4.50 pack-year smoking history.  She has never used smokeless tobacco. She reports that she drinks about 2.0 standard drinks of alcohol per week. She reports that she does not use drugs.  Medications  Current Outpatient Medications:  .  amLODipine (NORVASC) 10 MG tablet, Take 1 tablet (10 mg total) by mouth daily., Disp: 90 tablet, Rfl: 3 .  DULoxetine (CYMBALTA) 30 MG capsule, Take 1 capsule (30 mg total) by mouth daily for 30 days, THEN 2 capsules (60 mg total) daily., Disp: 150 capsule, Rfl: 0 .  lisinopril (PRINIVIL,ZESTRIL) 40 MG tablet, Take 1 tablet (40 mg total) by mouth daily., Disp: 90 tablet, Rfl: 3 .  norethindrone (MICRONOR,CAMILA,ERRIN) 0.35 MG tablet, TAKE ONE TABLET BY MOUTH ONCE DAILY, Disp: 28 tablet, Rfl: 3 .  oxyCODONE-acetaminophen (PERCOCET) 10-325 MG tablet, Take 1 tablet by mouth 2 (two) times daily as needed for pain., Disp: 60 tablet, Rfl: 0 .  [START ON 12/31/2017] oxyCODONE-acetaminophen (PERCOCET) 10-325 MG tablet, Take 1 tablet by mouth 2 (two) times daily as needed for pain.,  Disp: 60 tablet, Rfl: 0 .  triamterene-hydrochlorothiazide (MAXZIDE-25) 37.5-25 MG tablet, TAKE 1 TABLET BY MOUTH ONCE A DAY., Disp: 30 tablet, Rfl: 3 .  warfarin (COUMADIN) 10 MG tablet, Take 1 tablet (10 mg total) by mouth daily., Disp: 34 tablet, Rfl: 3 .  warfarin (COUMADIN) 2.5 MG tablet, Take 2 tablets (5mg ) daily along with the 10mg  tablet for a total of 15mg  daily, Disp: 60 tablet, Rfl: 1 No current facility-administered medications for this visit.   Facility-Administered Medications Ordered in Other Visits:  .  0.9 %  sodium chloride infusion, , Intravenous, Continuous, Kefalas, Manon Hilding, PA-C, Last Rate: 20 mL/hr at 12/22/15 1430  Allergies Sulfa antibiotics  Review of Systems Review of Systems - Oncology ROS negative other than menorrhagia   Physical Exam  Vitals Wt Readings from Last 3 Encounters:  12/30/17 (!) 352 lb 6.4 oz (159.8 kg)  12/01/17 (!) 351 lb (159.2 kg)  11/07/17 (!) 355 lb 11.2 oz (161.3 kg)   Temp Readings from Last 3 Encounters:  12/30/17 98 F (36.7 C) (Oral)  11/20/17 98.8 F (37.1 C) (Oral)  11/13/17 98 F (36.7 C) (Oral)   BP Readings from Last 3 Encounters:  12/30/17 118/66  11/20/17 134/64  11/13/17 133/64   Pulse Readings from Last 3 Encounters:  12/30/17 82  11/20/17 83  11/13/17 73   Constitutional: Well-developed, well-nourished, and in no distress.   HENT: Head: Normocephalic and atraumatic.  Mouth/Throat: No oropharyngeal exudate. Mucosa moist. Eyes: Pupils are equal, round, and reactive to light. Conjunctivae are normal. No scleral icterus.  Neck: Normal range of motion. Neck supple. No JVD present.  Cardiovascular: Normal rate, regular rhythm and normal heart sounds.  Exam reveals no gallop and no friction rub.   No murmur heard. Pulmonary/Chest: Effort normal and breath sounds normal. No respiratory distress. No wheezes.No rales.  Abdominal: Soft. Bowel sounds are normal. No distension. There is no tenderness. There is no  guarding.  Musculoskeletal: No edema or tenderness.  Lymphadenopathy: No cervical, axillary or supraclavicular adenopathy.  Neurological: Alert and oriented to person, place, and time. No cranial nerve deficit.  Skin: Skin is warm and dry. No rash noted. No erythema. No pallor.  Psychiatric: Affect and judgment normal.   Labs Appointment on 12/29/2017  Component Date Value Ref Range Status  . WBC 12/29/2017 7.0  4.0 - 10.5 K/uL Final  . RBC 12/29/2017 4.28  3.87 - 5.11 MIL/uL Final  . Hemoglobin 12/29/2017 12.1  12.0 -  15.0 g/dL Final  . HCT 12/29/2017 39.0  36.0 - 46.0 % Final  . MCV 12/29/2017 91.1  80.0 - 100.0 fL Final  . MCH 12/29/2017 28.3  26.0 - 34.0 pg Final  . MCHC 12/29/2017 31.0  30.0 - 36.0 g/dL Final  . RDW 12/29/2017 22.5* 11.5 - 15.5 % Final  . Platelets 12/29/2017 311  150 - 400 K/uL Final  . nRBC 12/29/2017 0.0  0.0 - 0.2 % Final  . Neutrophils Relative % 12/29/2017 71  % Final  . Neutro Abs 12/29/2017 4.9  1.7 - 7.7 K/uL Final  . Lymphocytes Relative 12/29/2017 19  % Final  . Lymphs Abs 12/29/2017 1.4  0.7 - 4.0 K/uL Final  . Monocytes Relative 12/29/2017 6  % Final  . Monocytes Absolute 12/29/2017 0.4  0.1 - 1.0 K/uL Final  . Eosinophils Relative 12/29/2017 3  % Final  . Eosinophils Absolute 12/29/2017 0.2  0.0 - 0.5 K/uL Final  . Basophils Relative 12/29/2017 1  % Final  . Basophils Absolute 12/29/2017 0.1  0.0 - 0.1 K/uL Final  . Immature Granulocytes 12/29/2017 0  % Final  . Abs Immature Granulocytes 12/29/2017 0.03  0.00 - 0.07 K/uL Final   Performed at [facility], [address] [place], [place] [address]  . Sodium 12/29/2017 137  135 - 145 mmol/L Final  . Potassium 12/29/2017 4.3  3.5 - 5.1 mmol/L Final  . Chloride 12/29/2017 103  98 - 111 mmol/L Final  . CO2 12/29/2017 28  22 - 32 mmol/L Final  . Glucose, Bld 12/29/2017 98  70 - 99 mg/dL Final  . BUN 12/29/2017 17  6 - 20 mg/dL Final  . Creatinine, Ser 12/29/2017 0.86  0.44 - 1.00 mg/dL Final  .  Calcium 12/29/2017 9.2  8.9 - 10.3 mg/dL Final  . Total Protein 12/29/2017 8.4* 6.5 - 8.1 g/dL Final  . Albumin 12/29/2017 3.8  3.5 - 5.0 g/dL Final  . AST 12/29/2017 14* 15 - 41 U/L Final  . ALT 12/29/2017 12  0 - 44 U/L Final  . Alkaline Phosphatase 12/29/2017 66  38 - 126 U/L Final  . Total Bilirubin 12/29/2017 0.2* 0.3 - 1.2 mg/dL Final  . GFR calc non Af Amer 12/29/2017 >60  >60 mL/min Final  . GFR calc Af Amer 12/29/2017 >60  >60 mL/min Final  . Anion gap 12/29/2017 6  5 - 15 Final   Performed at [facility], [address], [place], [place] [address]  . LDH 12/29/2017 102  98 - 192 U/L Final   Performed at [facility], [address] [place], [place] [address]  . Ferritin 12/29/2017 133  11 - 307 ng/mL Final   Performed at [facility], [address] [place], [place] [address]     Pathology No orders of the defined types were placed in this encounter.      Zoila Shutter MD

## 2017-12-30 NOTE — Patient Instructions (Signed)
[facility]  Discharge Instructions: You saw Dr. Higgs today                               _______________________________________________________________  Thank you for choosing [facility] at [facility] to provide your oncology and hematology care.  To afford each patient quality time with our providers, please arrive at least 15 minutes before your scheduled appointment.  You need to re-schedule your appointment if you arrive 10 or more minutes late.  We strive to give you quality time with our providers, and arriving late affects you and other patients whose appointments are after yours.  Also, if you no show three or more times for appointments you may be dismissed from the clinic.  Again, thank you for choosing [facility] at [facility]. Our hope is that these requests will allow you access to exceptional care and in a timely manner. _______________________________________________________________  If you have questions after your visit, please contact our office at [phone] between the hours of 8:30 a.m. and 5:00 p.m. Voicemails left after 4:30 p.m. will not be returned until the following business day. _______________________________________________________________  For prescription refill requests, have your pharmacy contact our office. _______________________________________________________________  Recommendations made by the consultant and any test results will be sent to your referring physician. _______________________________________________________________ 

## 2018-01-01 ENCOUNTER — Other Ambulatory Visit (HOSPITAL_COMMUNITY): Payer: Self-pay

## 2018-01-01 ENCOUNTER — Ambulatory Visit (HOSPITAL_COMMUNITY): Payer: Self-pay | Admitting: Internal Medicine

## 2018-01-05 ENCOUNTER — Other Ambulatory Visit: Payer: Self-pay | Admitting: Cardiology

## 2018-01-05 DIAGNOSIS — I2602 Saddle embolus of pulmonary artery with acute cor pulmonale: Secondary | ICD-10-CM

## 2018-01-06 ENCOUNTER — Ambulatory Visit (INDEPENDENT_AMBULATORY_CARE_PROVIDER_SITE_OTHER): Payer: Medicaid Other | Admitting: *Deleted

## 2018-01-06 ENCOUNTER — Encounter: Payer: Self-pay | Admitting: Gastroenterology

## 2018-01-06 DIAGNOSIS — Z5181 Encounter for therapeutic drug level monitoring: Secondary | ICD-10-CM

## 2018-01-06 DIAGNOSIS — Z86711 Personal history of pulmonary embolism: Secondary | ICD-10-CM

## 2018-01-06 LAB — POCT INR: INR: 7.9 — AB (ref 2.0–3.0)

## 2018-01-06 NOTE — Patient Instructions (Signed)
Hold coumadin and recheck on Thursday 01/08/18 Was started on Cymbalta 60mg  daily which elevates INR

## 2018-01-08 ENCOUNTER — Ambulatory Visit (INDEPENDENT_AMBULATORY_CARE_PROVIDER_SITE_OTHER): Payer: Medicaid Other | Admitting: *Deleted

## 2018-01-08 DIAGNOSIS — Z5181 Encounter for therapeutic drug level monitoring: Secondary | ICD-10-CM | POA: Diagnosis not present

## 2018-01-08 DIAGNOSIS — I2602 Saddle embolus of pulmonary artery with acute cor pulmonale: Secondary | ICD-10-CM

## 2018-01-08 DIAGNOSIS — Z86711 Personal history of pulmonary embolism: Secondary | ICD-10-CM

## 2018-01-08 LAB — POCT INR: INR: 2 (ref 2.0–3.0)

## 2018-01-08 NOTE — Patient Instructions (Signed)
Decrease dose to 10mg  daily except 15mg  on Tuesdays and Saturdays Was started on Cymbalta 60mg  daily which elevates INR Recheck 01/22/18

## 2018-01-27 ENCOUNTER — Ambulatory Visit (INDEPENDENT_AMBULATORY_CARE_PROVIDER_SITE_OTHER): Payer: Medicaid Other | Admitting: Pharmacist

## 2018-01-27 DIAGNOSIS — Z5181 Encounter for therapeutic drug level monitoring: Secondary | ICD-10-CM | POA: Diagnosis not present

## 2018-01-27 DIAGNOSIS — Z86711 Personal history of pulmonary embolism: Secondary | ICD-10-CM

## 2018-01-27 LAB — POCT INR: INR: 2.8 (ref 2.0–3.0)

## 2018-01-27 NOTE — Patient Instructions (Signed)
Description   Continue 10mg  daily except 15mg  on Tuesdays and Saturdays Recheck in 4 weeks

## 2018-01-28 ENCOUNTER — Encounter: Payer: Self-pay | Admitting: Nurse Practitioner

## 2018-01-28 ENCOUNTER — Ambulatory Visit: Payer: Medicaid Other | Attending: Nurse Practitioner | Admitting: Nurse Practitioner

## 2018-01-28 ENCOUNTER — Other Ambulatory Visit: Payer: Self-pay

## 2018-01-28 VITALS — BP 150/82 | HR 78 | Temp 98.3°F | Resp 16 | Ht 70.0 in | Wt 351.0 lb

## 2018-01-28 DIAGNOSIS — G8929 Other chronic pain: Secondary | ICD-10-CM | POA: Insufficient documentation

## 2018-01-28 DIAGNOSIS — M5441 Lumbago with sciatica, right side: Secondary | ICD-10-CM

## 2018-01-28 DIAGNOSIS — Z79891 Long term (current) use of opiate analgesic: Secondary | ICD-10-CM | POA: Insufficient documentation

## 2018-01-28 DIAGNOSIS — M5442 Lumbago with sciatica, left side: Secondary | ICD-10-CM | POA: Diagnosis present

## 2018-01-28 DIAGNOSIS — G894 Chronic pain syndrome: Secondary | ICD-10-CM | POA: Insufficient documentation

## 2018-01-28 DIAGNOSIS — M25572 Pain in left ankle and joints of left foot: Secondary | ICD-10-CM | POA: Insufficient documentation

## 2018-01-28 MED ORDER — OXYCODONE-ACETAMINOPHEN 10-325 MG PO TABS: 1.0000 | ORAL_TABLET | Freq: Two times a day (BID) | ORAL | 0 refills | Status: DC | PRN

## 2018-01-28 NOTE — Progress Notes (Signed)
Nursing Pain Medication Assessment:  Safety precautions to be maintained throughout the outpatient stay will include: orient to surroundings, keep bed in low position, maintain call bell within reach at all times, provide assistance with transfer out of bed and ambulation.  Medication Inspection Compliance: Pill count conducted under aseptic conditions, in front of the patient. Neither the pills nor the bottle was removed from the patient's sight at any time. Once count was completed pills were immediately returned to the patient in their original bottle.  Medication: Oxycodone/APAP Pill/Patch Count: 0 of 60 pills remain Pill/Patch Appearance: Markings consistent with prescribed medication Bottle Appearance: Standard pharmacy container. Clearly labeled. Filled Date: 12/11 / 2019 Last Medication intake:  Today   States has 2 pill remaining in pill container at home.

## 2018-01-28 NOTE — Progress Notes (Signed)
Patient's Name: [name] [name]  MRN: [id number]  Referring Provider: Rosita Fire, MD  DOB: 08-04-1975  PCP: Rosita Fire, MD  DOS: 01/28/2018  Note by: Vevelyn Francois NP  Service setting: Ambulatory outpatient  Specialty: Interventional Pain Management  Location: [facility] (AMB) Pain Management Facility    Patient type: Established    Primary Reason(s) for Visit: Encounter for prescription drug management. (Level of risk: moderate)  CC: Back Pain (lower)  HPI  Ms. Edgin is a 43 y.o. year old, female patient, who comes today for a medication management evaluation. She has Pleuritic chest pain; SOB (shortness of breath); Hyperglycemia; Anemia; Tobacco use disorder; Saddle embolus of pulmonary artery with acute cor pulmonale ([place]); Secondary pulmonary hypertension; OSA on CPAP; Back pain with left-sided radiculopathy; Iron deficiency anemia; Benign essential hypertension; Morbid obesity ([employer]); Uterine leiomyoma; Abdominal cramps; History of pulmonary embolism; Encounter for therapeutic drug monitoring; History of miscarriage; HTN (hypertension); Left leg DVT ([place]); Menorrhagia; S/P IVC filter; Bilateral pulmonary embolism ([place]); Chronic bilateral low back pain with bilateral sciatica; Chronic pain of left ankle; Chronic pain syndrome; and Long term current use of opiate analgesic on their problem list. Her primarily concern today is the Back Pain (lower)  Pain Assessment: Location: Lateral Back Radiating: both legs to just above the ankles Duration: Chronic pain Quality: Throbbing(piercing) Severity: 7 /10 (subjective, self-reported pain score)  Note: Reported level is compatible with observation. Clinically the patient looks like a 2/10 A 2/10 is viewed as "Mild to Moderate" and described as noticeable and distracting. Impossible to hide from other people. More frequent flare-ups. Still possible to adapt and function close to normal. It can be very annoying and may have occasional stronger flare-ups.  With discipline, patients may get used to it and adapt. Information on the proper use of the pain scale provided to the patient today. When using our objective Pain Scale, levels between 6 and 10/10 are said to belong in an emergency room, as it progressively worsens from a 6/10, described as severely limiting, requiring emergency care not usually available at an outpatient pain management facility. At a 6/10 level, communication becomes difficult and requires great effort. Assistance to reach the emergency department may be required. Facial flushing and profuse sweating along with potentially dangerous increases in heart rate and blood pressure will be evident. Timing: Intermittent Modifying factors: medications, heat BP: (!) 150/82  HR: 78  Ms. Bergman was last scheduled for an appointment on 12/01/2017 for medication management. During today's appointment we reviewed Ms. Maura's chronic pain status, as well as her outpatient medication regimen. She has numbness and tingling in both legs. She has weakness with standing for long periods.   The patient  reports no history of drug use. Her body mass index is 50.36 kg/m.  Further details on both, my assessment(s), as well as the proposed treatment plan, please see below.  Controlled Substance Pharmacotherapy Assessment REMS (Risk Evaluation and Mitigation Strategy)  PMP and historical list of controlled substances:Oxycodone 10 mg twice daily as needed MME/day:63m/day WLandis Martins RN  01/28/2018 10:46 AM  Sign when Signing Visit Nursing Pain Medication Assessment:  Safety precautions to be maintained throughout the outpatient stay will include: orient to surroundings, keep bed in low position, maintain call bell within reach at all times, provide assistance with transfer out of bed and ambulation.  Medication Inspection Compliance: Pill count conducted under aseptic conditions, in front of the patient. Neither the pills nor the bottle was  removed from the patient's sight  at any time. Once count was completed pills were immediately returned to the patient in their original bottle.  Medication: Oxycodone/APAP Pill/Patch Count: 0 of 60 pills remain Pill/Patch Appearance: Markings consistent with prescribed medication Bottle Appearance: Standard pharmacy container. Clearly labeled. Filled Date: 12/11 / 2019 Last Medication intake:  Today   States has 2 pill remaining in pill container at home.   Pharmacokinetics: Liberation and absorption (onset of action): WNL Distribution (time to peak effect): WNL Metabolism and excretion (duration of action): WNL         Pharmacodynamics: Desired effects: Analgesia: Ms. Vialpando reports >50% benefit. Functional ability: Patient reports that medication allows her to accomplish basic ADLs Clinically meaningful improvement in function (CMIF): Sustained CMIF goals met Perceived effectiveness: Described as relatively effective, allowing for increase in activities of daily living (ADL) Undesirable effects: Side-effects or Adverse reactions: None reported Monitoring: [place] PMP: Online review of the past 34-monthperiod conducted. Compliant with practice rules and regulations Last UDS on record: Summary  Date Value Ref Range Status  09/04/2017 FINAL  Final    Comment:    ==================================================================== TOXASSURE COMP DRUG ANALYSIS,UR ==================================================================== Test                             Result       Flag       Units Drug Absent but Declared for Prescription Verification   Oxycodone                      Not Detected UNEXPECTED ng/mg creat   Pregabalin                     Not Detected UNEXPECTED   Diclofenac                     Not Detected UNEXPECTED    Diclofenac, as indicated in the declared medication list, is not    always detected even when used as  directed. ==================================================================== Test                      Result    Flag   Units      Ref Range   Creatinine              45               mg/dL      >=20 ==================================================================== Declared Medications:  The flagging and interpretation on this report are based on the  following declared medications.  Unexpected results may arise from  inaccuracies in the declared medications.  **Note: The testing scope of this panel includes these medications:  Oxycodone  Pregabalin  **Note: The testing scope of this panel does not include small to  moderate amounts of these reported medications:  Diclofenac  **Note: The testing scope of this panel does not include following  reported medications:  Amlodipine Besylate  Hydrochlorothiazide (Triamterine-Hydrochlorthzide)  Lisinopril  Norethindrone  Triamterene (Triamterine-Hydrochlorthzide)  Warfarin ==================================================================== For clinical consultation, please call ([phone] ====================================================================    UDS interpretation: Non-Compliant          Medication Assessment Form: Not applicable. Initial evaluation. The patient has not received any medications from our practice Treatment compliance: Compliant Risk Assessment Profile: Aberrant behavior: See prior evaluations. None observed or detected today Comorbid factors increasing risk of overdose: See prior notes. No additional risks detected today Opioid risk tool (ORT) (  Total Score): 0 Personal History of Substance Abuse (SUD-Substance use disorder):  Alcohol: Negative  Illegal Drugs: Negative  Rx Drugs: Negative  ORT Risk Level calculation: Low Risk Risk of substance use disorder (SUD): Low Opioid Risk Tool - 01/28/18 1043      Family History of Substance Abuse   Alcohol  Negative    Illegal Drugs  Negative    Rx  Drugs  Negative      Personal History of Substance Abuse   Alcohol  Negative    Illegal Drugs  Negative    Rx Drugs  Negative      History of Preadolescent Sexual Abuse   History of Preadolescent Sexual Abuse  Negative or Female      Psychological Disease   Psychological Disease  Negative    Depression  Negative      Total Score   Opioid Risk Tool Scoring  0    Opioid Risk Interpretation  Low Risk      ORT Scoring interpretation table:  Score <3 = Low Risk for SUD  Score between 4-7 = Moderate Risk for SUD  Score >8 = High Risk for Opioid Abuse   Risk Mitigation Strategies:  Patient Counseling: Covered Patient-Prescriber Agreement (PPA): Present and active  Notification to other healthcare providers: Done  Pharmacologic Plan: No change in therapy, at this time.             Laboratory Chemistry  Inflammation Markers (CRP: Acute Phase) (ESR: Chronic Phase) Lab Results  Component Value Date   ESRSEDRATE 25 (H) 10/28/2013   LATICACIDVEN 6.2 (H) 04/27/2013                         Rheumatology Markers No results found for: RF, ANA, LABURIC, URICUR, LYMEIGGIGMAB, LYMEABIGMQN, HLAB27                      Renal Function Markers Lab Results  Component Value Date   BUN 17 12/29/2017   CREATININE 0.86 54/65/0354   BCR NOT APPLICABLE 65/68/1275   GFRAA >60 12/29/2017   GFRNONAA >60 12/29/2017                             Hepatic Function Markers Lab Results  Component Value Date   AST 14 (L) 12/29/2017   ALT 12 12/29/2017   ALBUMIN 3.8 12/29/2017   ALKPHOS 66 12/29/2017                        Electrolytes Lab Results  Component Value Date   NA 137 12/29/2017   K 4.3 12/29/2017   CL 103 12/29/2017   CALCIUM 9.2 12/29/2017                        Neuropathy Markers Lab Results  Component Value Date   VITAMINB12 307 05/19/2014   FOLATE 8.0 05/19/2014   HGBA1C 5.3 01/31/2017   HIV NON-REACTIVE 01/31/2017                        CNS Tests No results found  for: COLORCSF, APPEARCSF, RBCCOUNTCSF, WBCCSF, POLYSCSF, LYMPHSCSF, EOSCSF, PROTEINCSF, GLUCCSF, JCVIRUS, CSFOLI, IGGCSF                      Bone Pathology Markers No results found for: [place], [id number], [id number], [id number], [place],  25OHVITD2, 25OHVITD3, TESTOFREE, TESTOSTERONE                       Coagulation Parameters Lab Results  Component Value Date   INR 2.8 01/27/2018   LABPROT 31.1 (H) 07/31/2017   APTT 54 (H) 02/17/2015   PLT 311 12/29/2017   DDIMER 0.76 (H) 01/27/2014                        Cardiovascular Markers Lab Results  Component Value Date   TROPONINI <0.30 04/27/2013   HGB 12.1 12/29/2017   HCT 39.0 12/29/2017                         CA Markers No results found for: CEA, CA125, LABCA2                      Note: Lab results reviewed.  Recent Diagnostic Imaging Results  ECHOCARDIOGRAM COMPLETE                    *[address], [place] [address]                            [phone]  ------------------------------------------------------------------- Transthoracic Echocardiography  Patient:    Leeyah, Heather MR #:       [id number] Study Date: 09/03/2017 Gender:     F Age:        41 Height:     177.8 cm Weight:     165.1 kg BSA:        2.95 m^2 Pt. Status: Room:   ATTENDING    Kerry Hough, M.D.  Berna Spare, M.D.  REFERRING    Kerry Hough, M.D.  PERFORMING   [facility], Forestine Na  SONOGRAPHER  Alvino Chapel, RCS  cc:  ------------------------------------------------------------------- LV EF: 60% -   65%  ------------------------------------------------------------------- Indications:      Pulmonary hypertension - secondary 416.8.  ------------------------------------------------------------------- History:   PMH:  Acquired from the patient and from the patient&'s chart.  PMH:  Long time anticoagulation Tobacco use  disorder. Saddle embolus of pulmonary artery with acute cor pulmonale Obstructive sleep apnea. Morbid obesity  Risk factors: Hypertension.  ------------------------------------------------------------------- Study Conclusions  - Left ventricle: The cavity size was normal. Wall thickness was   increased in a pattern of mild LVH. Systolic function was normal.   The estimated ejection fraction was in the range of 60% to 65%.   Wall motion was normal; there were no regional wall motion   abnormalities. Left ventricular diastolic function parameters   were normal.  ------------------------------------------------------------------- Study data:  Comparison was made to the study of 04/28/2013.  Study status:  Routine.  Procedure:  The patient reported no pain pre or post test. Transthoracic echocardiography. Image quality was adequate.  Study completion:  There were no complications. Transthoracic echocardiography.  M-mode, complete 2D, spectral Doppler, and color Doppler.  Birthdate:  Patient birthdate: Jun 24, 1975.  Age:  Patient is 43 yr old.  Sex:  Gender: female. BMI: 52.2 kg/m^2.  Blood pressure:  163/91  Patient status: Inpatient.  Study date:  Study date: 09/03/2017. Study time: 09:38 AM.  Location:  [facility].  -------------------------------------------------------------------  ------------------------------------------------------------------- Left ventricle:  The cavity size was normal. Wall thickness was increased in a pattern of mild LVH. Systolic function was normal. The estimated ejection fraction was in the range of 60% to 65%. Wall motion was normal; there were no regional wall motion abnormalities. The transmitral flow pattern was normal. The deceleration time of the early transmitral flow velocity was normal. The pulmonary vein flow pattern was normal. The tissue Doppler parameters were normal. Left ventricular diastolic function parameters were  normal.  ------------------------------------------------------------------- Aortic valve:   Trileaflet.  Doppler:   There was no stenosis. There was no regurgitation.    VTI ratio of LVOT to aortic valve: 0.73. Valve area (VTI): 2.52 cm^2. Indexed valve area (VTI): 0.85 cm^2/m^2. Peak velocity ratio of LVOT to aortic valve: 0.76. Valve area (Vmax): 2.64 cm^2. Indexed valve area (Vmax): 0.9 cm^2/m^2. Mean velocity ratio of LVOT to aortic valve: 0.75. Valve area (Vmean): 2.59 cm^2. Indexed valve area (Vmean): 0.88 cm^2/m^2. Mean gradient (S): 6 mm Hg. Peak gradient (S): 11 mm Hg.  ------------------------------------------------------------------- Aorta:  Aortic root: The aortic root was normal in size.  ------------------------------------------------------------------- Mitral valve:   Structurally normal valve.    Doppler:  There was no significant regurgitation.    Peak gradient (D): 3 mm Hg.  ------------------------------------------------------------------- Left atrium:  The atrium was normal in size.  ------------------------------------------------------------------- Atrial septum:  No defect or patent foramen ovale was identified.   ------------------------------------------------------------------- Right ventricle:  The cavity size was normal. Wall thickness was normal. Systolic function was normal.  ------------------------------------------------------------------- Pulmonic valve:    The valve appears to be grossly normal. Doppler:  There was no significant regurgitation.  ------------------------------------------------------------------- Tricuspid valve:   Structurally normal valve.    Doppler:  There was no significant regurgitation.  ------------------------------------------------------------------- Right atrium:  The atrium was normal in size.  ------------------------------------------------------------------- Pericardium:  There was no pericardial  effusion.  ------------------------------------------------------------------- Systemic veins: Inferior vena cava: The vessel was normal in size. The respirophasic diameter changes were in the normal range (>= 50%), consistent with normal central venous pressure.  ------------------------------------------------------------------- Measurements   Left ventricle                           Value          Reference  LV ID, ED, PLAX chordal                  50.2  mm       43 - 52  LV ID, ES, PLAX chordal                  32.7  mm       23 - 38  LV fx shortening, PLAX chordal           35    %        >=29  LV PW thickness, ED                      11.1  mm       ----------  IVS/LV PW ratio, ED                      1.05           <=1.3  Stroke volume, 2D  90    ml       ----------  Stroke volume/bsa, 2D                    31    ml/m^2   ----------  LV ejection fraction, 1-p A4C            74    %        ----------  LV end-diastolic volume, 2-p             88    ml       ----------  LV end-systolic volume, 2-p              29    ml       ----------  LV ejection fraction, 2-p                67    %        ----------  Stroke volume, 2-p                       59    ml       ----------  LV end-diastolic volume/bsa, 2-p         30    ml/m^2   ----------  LV end-systolic volume/bsa, 2-p          10    ml/m^2   ----------  Stroke volume/bsa, 2-p                   19.9  ml/m^2   ----------  LV e&', lateral                           11.3  cm/s     ----------  LV E/e&', lateral                         7.37           ----------  LV e&', medial                            8.38  cm/s     ----------  LV E/e&', medial                          9.94           ----------  LV e&', average                           9.84  cm/s     ----------  LV E/e&', average                         8.47           ----------    Ventricular septum                       Value          Reference  IVS  thickness, ED                        11.6  mm       ----------    LVOT  Value          Reference  LVOT ID, S                               21    mm       ----------  LVOT area                                3.46  cm^2     ----------  LVOT peak velocity, S                    129   cm/s     ----------  LVOT mean velocity, S                    83.2  cm/s     ----------  LVOT VTI, S                              25.9  cm       ----------  LVOT peak gradient, S                    7     mm Hg    ----------    Aortic valve                             Value          Reference  Aortic valve peak velocity, S            169   cm/s     ----------  Aortic valve mean velocity, S            111   cm/s     ----------  Aortic valve VTI, S                      35.6  cm       ----------  Aortic mean gradient, S                  6     mm Hg    ----------  Aortic peak gradient, S                  11    mm Hg    ----------  VTI ratio, LVOT/AV                       0.73           ----------  Aortic valve area, VTI                   2.52  cm^2     ----------  Aortic valve area/bsa, VTI               0.85  cm^2/m^2 ----------  Velocity ratio, peak, LVOT/AV            0.76           ----------  Aortic valve area, peak velocity         2.64  cm^2     ----------  Aortic valve area/bsa, peak              0.9  cm^2/m^2 ----------  velocity  Velocity ratio, mean, LVOT/AV            0.75           ----------  Aortic valve area, mean velocity         2.59  cm^2     ----------  Aortic valve area/bsa, mean              0.88  cm^2/m^2 ----------  velocity    Aorta                                    Value          Reference  Aortic root ID, ED                       30    mm       ----------    Left atrium                              Value          Reference  LA ID, A-P, ES                           43    mm       ----------  LA ID/bsa, A-P                           1.46  cm/m^2    <=2.2  LA volume, S                             65.6  ml       ----------  LA volume/bsa, S                         22.3  ml/m^2   ----------  LA volume, ES, 1-p A4C                   58.5  ml       ----------  LA volume/bsa, ES, 1-p A4C               19.8  ml/m^2   ----------  LA volume, ES, 1-p A2C                   71.5  ml       ----------  LA volume/bsa, ES, 1-p A2C               24.3  ml/m^2   ----------    Mitral valve                             Value          Reference  Mitral E-wave peak velocity              83.3  cm/s     ----------  Mitral A-wave peak velocity              67.5  cm/s     ----------  Mitral deceleration time  201   ms       150 - 230  Mitral peak gradient, D                  3     mm Hg    ----------  Mitral E/A ratio, peak                   1.2            ----------    Right atrium                             Value          Reference  RA ID, S-I, ES, A4C              (H)     53.4  mm       34 - 49  RA area, ES, A4C                         14.2  cm^2     8.3 - 19.5  RA volume, ES, A/L                       31.8  ml       ----------  RA volume/bsa, ES, A/L                   10.8  ml/m^2   ----------    Systemic veins                           Value          Reference  Estimated CVP                            3     mm Hg    ----------    Right ventricle                          Value          Reference  RV ID, ED, PLAX                          26.3  mm       19 - 38  TAPSE                                    21.2  mm       ----------  RV s&', lateral, S                        13.4  cm/s     ----------  Legend: (L)  and  (H)  mark values outside specified reference range.  ------------------------------------------------------------------- Prepared and Electronically Authenticated by  Kate Sable, MD 2019-08-14T10:29:35  Complexity Note: Imaging results reviewed. Results shared with Ms. Tomkinson, using Layman's terms.                          Meds   Current Outpatient Medications:  .  amLODipine (NORVASC) 10 MG tablet, Take 1 tablet (10 mg  total) by mouth daily., Disp: 90 tablet, Rfl: 3 .  DULoxetine (CYMBALTA) 30 MG capsule, Take 1 capsule (30 mg total) by mouth daily for 30 days, THEN 2 capsules (60 mg total) daily., Disp: 150 capsule, Rfl: 0 .  lisinopril (PRINIVIL,ZESTRIL) 40 MG tablet, Take 1 tablet (40 mg total) by mouth daily., Disp: 90 tablet, Rfl: 3 .  norethindrone (MICRONOR,CAMILA,ERRIN) 0.35 MG tablet, TAKE ONE TABLET BY MOUTH ONCE DAILY, Disp: 28 tablet, Rfl: 3 .  [START ON 03/01/2018] oxyCODONE-acetaminophen (PERCOCET) 10-325 MG tablet, Take 1 tablet by mouth 2 (two) times daily as needed for pain., Disp: 60 tablet, Rfl: 0 .  triamterene-hydrochlorothiazide (MAXZIDE-25) 37.5-25 MG tablet, TAKE 1 TABLET BY MOUTH ONCE A DAY., Disp: 30 tablet, Rfl: 3 .  warfarin (COUMADIN) 10 MG tablet, Take 1 tablet (10 mg total) by mouth daily., Disp: 34 tablet, Rfl: 3 .  warfarin (COUMADIN) 2.5 MG tablet, TAKE 2 TABLETS BY MOUTH DAILY ALONG WITH 10 MG TABLET FOR A TOTAL OF 15 MG A DAY., Disp: 60 tablet, Rfl: 3 .  [START ON 01/30/2018] oxyCODONE-acetaminophen (PERCOCET) 10-325 MG tablet, Take 1 tablet by mouth 2 (two) times daily as needed for pain., Disp: 60 tablet, Rfl: 0 No current facility-administered medications for this visit.   Facility-Administered Medications Ordered in Other Visits:  .  0.9 %  sodium chloride infusion, , Intravenous, Continuous, Kefalas, Manon Hilding, PA-C, Last Rate: 20 mL/hr at 12/22/15 1430  ROS  Constitutional: Denies any fever or chills Gastrointestinal: No reported hemesis, hematochezia, vomiting, or acute GI distress Musculoskeletal: Denies any acute onset joint swelling, redness, loss of ROM, or weakness Neurological: No reported episodes of acute onset apraxia, aphasia, dysarthria, agnosia, amnesia, paralysis, loss of coordination, or loss of consciousness  Allergies  Ms. Yakel is allergic to  sulfa antibiotics.  PFSH  Drug: Ms. Pothier  reports no history of drug use. Alcohol:  reports current alcohol use of about 2.0 standard drinks of alcohol per week. Tobacco:  reports that she has been smoking cigarettes. She has a 4.50 pack-year smoking history. She has never used smokeless tobacco. Medical:  has a past medical history of Abdominal cramps (08/30/2015), Anemia, Blood transfusion without reported diagnosis, Closed left ankle fracture, Clotting disorder ([place]), DVT (deep venous thrombosis) ([place]), Fibroid tumor, Hidradenitis, High blood pressure, Hypertension, Obesity, OSA on CPAP, Oxygen deficiency, Pregnancy, Pulmonary embolism ([place]) (04/2013), Sleep apnea, and Tobacco use disorder. Surgical: Ms. Bihl  has a past surgical history that includes Cyst removal trunk; Abscess drainage (Bilateral, 01/10/15); and IVC FILTER PLACEMENT ([place] [facility]). Family: family history includes Cancer in her maternal grandmother; Diabetes in her paternal aunt and paternal uncle; Heart attack in her paternal grandmother; Hypertension in her mother; Other in her father.  Constitutional Exam  General appearance: Well nourished, well developed, and well hydrated. In no apparent acute distress Vitals:   01/28/18 1040  BP: (!) 150/82  Pulse: 78  Resp: 16  Temp: 98.3 F (36.8 C)  TempSrc: Oral  SpO2: 100%  Weight: (!) 351 lb (159.2 kg)  Height: 5' 10"  (1.778 m)  Psych/Mental status: Alert, oriented x 3 (person, place, & time)       Eyes: PERLA Respiratory: No evidence of acute respiratory distress   Lumbar Spine Area Exam  Skin & Axial Inspection: No masses, redness, or swelling Alignment: Symmetrical Functional ROM: Unrestricted ROM       Stability: No instability detected Muscle Tone/Strength: Functionally intact. No obvious neuro-muscular anomalies detected. Sensory (Neurological): Unimpaired Palpation: No palpable anomalies  Provocative Tests: Hyperextension/rotation test: deferred today        Lumbar quadrant test (Kemp's test): deferred today       Lateral bending test: deferred today       Patrick's Maneuver: deferred today                    Gait & Posture Assessment  Ambulation: Unassisted Gait: Relatively normal for age and body habitus Posture: WNL   Lower Extremity Exam    Side: Right lower extremity  Side: Left lower extremity  Stability: No instability observed          Stability: No instability observed          Skin & Extremity Inspection: Skin color, temperature, and hair growth are WNL. No peripheral edema or cyanosis. No masses, redness, swelling, asymmetry, or associated skin lesions. No contractures.  Skin & Extremity Inspection: Skin color, temperature, and hair growth are WNL. No peripheral edema or cyanosis. No masses, redness, swelling, asymmetry, or associated skin lesions. No contractures.  Functional ROM: Unrestricted ROM                  Functional ROM: Unrestricted ROM                  Muscle Tone/Strength: Functionally intact. No obvious neuro-muscular anomalies detected.  Muscle Tone/Strength: Functionally intact. No obvious neuro-muscular anomalies detected.  Sensory (Neurological): Unimpaired        Sensory (Neurological): Unimpaired            Palpation: No palpable anomalies  Palpation: No palpable anomalies   Assessment  Primary Diagnosis & Pertinent Problem List: The primary encounter diagnosis was Chronic bilateral low back pain with bilateral sciatica. Diagnoses of Chronic pain of left ankle, Chronic pain syndrome, and Long term current use of opiate analgesic were also pertinent to this visit.  Status Diagnosis  Persistent Controlled Controlled 1. Chronic bilateral low back pain with bilateral sciatica   2. Chronic pain of left ankle   3. Chronic pain syndrome   4. Long term current use of opiate analgesic     Problems updated and reviewed during this visit: No problems updated. Plan of Care  Pharmacotherapy (Medications  Ordered): Meds ordered this encounter  Medications  . oxyCODONE-acetaminophen (PERCOCET) 10-325 MG tablet    Sig: Take 1 tablet by mouth 2 (two) times daily as needed for pain.    Dispense:  60 tablet    Refill:  0    Do not place this medication, or any other prescription from our practice, on "Automatic Refill". Patient may have prescription filled one day early if pharmacy is closed on scheduled refill date.    Order Specific Question:   Supervising Provider    Answer:   Milinda Pointer [phone]  . oxyCODONE-acetaminophen (PERCOCET) 10-325 MG tablet    Sig: Take 1 tablet by mouth 2 (two) times daily as needed for pain.    Dispense:  60 tablet    Refill:  0    Do not place this medication, or any other prescription from our practice, on "Automatic Refill". Patient may have prescription filled one day early if pharmacy is closed on scheduled refill date.    Order Specific Question:   Supervising Provider    Answer:   Milinda Pointer [[id number]]   New Prescriptions   No medications on file   Medications administered today: Iris R. Arshad had no medications administered during this visit. Lab-work, procedure(s), and/or referral(s): Orders Placed  This Encounter  Procedures  . ToxASSURE Select 13 (MW), Urine   Imaging and/or referral(s): None  Interventional therapies: Planned, scheduled, and/or pending:   Not at this time.     Provider-requested follow-up: Return in about 2 months (around 03/29/2018) for MedMgmt.  Future Appointments  Date Time Provider [place]  02/24/2018  9:30 AM [facility]  03/24/2018  9:00 AM Mahala Menghini, PA-C [facility]  03/25/2018  9:45 AM Vevelyn Francois, NP [facility] None  07/01/2018 10:50 AM [facility] None  07/08/2018 11:15 AM Derek Jack, MD [facility] None   Primary Care Physician: Rosita Fire, MD Location: [facility] Note by: Vevelyn Francois NP Date:  01/28/2018; Time: 3:29 PM  Pain Score Disclaimer: We use the NRS-11 scale. This is a self-reported, subjective measurement of pain severity with only modest accuracy. It is used primarily to identify changes within a particular patient. It must be understood that outpatient pain scales are significantly less accurate that those used for research, where they can be applied under ideal controlled circumstances with minimal exposure to variables. In reality, the score is likely to be a combination of pain intensity and pain affect, where pain affect describes the degree of emotional arousal or changes in action readiness caused by the sensory experience of pain. Factors such as social and work situation, setting, emotional state, anxiety levels, expectation, and prior pain experience may influence pain perception and show large inter-individual differences that may also be affected by time variables.  Patient instructions provided during this appointment: Patient Instructions   ____________________________________________________________________________________________  Medication Rules  Purpose: To inform patients, and their family members, of our rules and regulations.  Applies to: All patients receiving prescriptions (written or electronic).  Pharmacy of record: Pharmacy where electronic prescriptions will be sent. If written prescriptions are taken to a different pharmacy, please inform the nursing staff. The pharmacy listed in the electronic medical record should be the one where you would like electronic prescriptions to be sent.  Electronic prescriptions: In compliance with the [place] (STOP) Act of 2017 (Session Lanny Cramp [phone]), effective January 21, 2018, all controlled substances must be electronically prescribed. Calling prescriptions to the pharmacy will cease to exist.  Prescription refills: Only during scheduled appointments. Applies to all  prescriptions.  NOTE: The following applies primarily to controlled substances (Opioid* Pain Medications).   Patient's responsibilities: 1. Pain Pills: Bring all pain pills to every appointment (except for procedure appointments). 2. Pill Bottles: Bring pills in original pharmacy bottle. Always bring the newest bottle. Bring bottle, even if empty. 3. Medication refills: You are responsible for knowing and keeping track of what medications you take and those you need refilled. The day before your appointment: write a list of all prescriptions that need to be refilled. The day of the appointment: give the list to the admitting nurse. Prescriptions will be written only during appointments. If you forget a medication: it will not be "Called in", "Faxed", or "electronically sent". You will need to get another appointment to get these prescribed. No early refills. Do not call asking to have your prescription filled early. 4. Prescription Accuracy: You are responsible for carefully inspecting your prescriptions before leaving our office. Have the discharge nurse carefully go over each prescription with you, before taking them home. Make sure that your name is accurately spelled, that your address is correct. Check the name and dose of your medication to make sure it is  accurate. Check the number of pills, and the written instructions to make sure they are clear and accurate. Make sure that you are given enough medication to last until your next medication refill appointment. 5. Taking Medication: Take medication as prescribed. When it comes to controlled substances, taking less pills or less frequently than prescribed is permitted and encouraged. Never take more pills than instructed. Never take medication more frequently than prescribed.  6. Inform other Doctors: Always inform, all of your healthcare providers, of all the medications you take. 7. Pain Medication from other Providers: You are not allowed  to accept any additional pain medication from any other Doctor or Healthcare provider. There are two exceptions to this rule. (see below) In the event that you require additional pain medication, you are responsible for notifying us, as stated below. 8. Medication Agreement: You are responsible for carefully reading and following our Medication Agreement. This must be signed before receiving any prescriptions from our practice. Safely store a copy of your signed Agreement. Violations to the Agreement will result in no further prescriptions. (Additional copies of our Medication Agreement are available upon request.) 9. Laws, Rules, & Regulations: All patients are expected to follow all Federal and [employer], [employer], Rules, [employer]. Ignorance of the Laws does not constitute a valid excuse. The use of any illegal substances is prohibited. 10. Adopted CDC guidelines & recommendations: Target dosing levels will be at or below 60 MME/day. Use of benzodiazepines** is not recommended.  Exceptions: There are only two exceptions to the rule of not receiving pain medications from other Healthcare Providers. 1. Exception #1 (Emergencies): In the event of an emergency (i.e.: accident requiring emergency care), you are allowed to receive additional pain medication. However, you are responsible for: As soon as you are able, call our office [phone], at any time of the day or night, and leave a message stating your name, the date and nature of the emergency, and the name and dose of the medication prescribed. In the event that your call is answered by a member of our staff, make sure to document and save the date, time, and the name of the person that took your information.  2. Exception #2 (Planned Surgery): In the event that you are scheduled by another doctor or dentist to have any type of surgery or procedure, you are allowed (for a period no longer than 30 days), to receive additional pain medication, for  the acute post-op pain. However, in this case, you are responsible for picking up a copy of our "Post-op Pain Management for Surgeons" handout, and giving it to your surgeon or dentist. This document is available at our office, and does not require an appointment to obtain it. Simply go to our office during business hours (Monday-Thursday from 8:00 AM to 4:00 PM) (Friday 8:00 AM to 12:00 Noon) or if you have a scheduled appointment with [place], prior to your surgery, and ask for it by name. In addition, you will need to provide [place] with your name, name of your surgeon, type of surgery, and date of procedure or surgery.  *Opioid medications include: morphine, codeine, oxycodone, oxymorphone, hydrocodone, hydromorphone, meperidine, tramadol, tapentadol, buprenorphine, fentanyl, methadone. **Benzodiazepine medications include: diazepam (Valium), alprazolam (Xanax), clonazepam (Klonopine), lorazepam (Ativan), clorazepate (Tranxene), chlordiazepoxide (Librium), estazolam (Prosom), oxazepam (Serax), temazepam (Restoril), triazolam (Halcion) (Last updated: 03/20/2017) ____________________________________________________________________________________________    ______________________________________________________________________________________________  Specialty Pain Scale  Introduction:  There are significant differences in how pain is reported. The word pain usually refers  to physical pain, but it is also a common synonym of suffering. The medical community uses a scale from 0 (zero) to 10 (ten) to report pain level. Zero (0) is described as "no pain", while ten (10) is described as "the worse pain you can imagine". The problem with this scale is that physical pain is reported along with suffering. Suffering refers to mental pain, or more often yet it refers to any unpleasant feeling, emotion or aversion associated with the perception of harm or threat of harm. It is the psychological component of  pain.  Pain Specialists prefer to separate the two components. The pain scale used by this practice is the Verbal Numerical Rating Scale (VNRS-11). This scale is for the physical pain only. DO NOT INCLUDE how your pain psychologically affects you. This scale is for adults 68 years of age and older. It has 11 (eleven) levels. The 1st level is 0/10. This means: "right now, I have no pain". In the context of pain management, it also means: "right now, my physical pain is under control with the current therapy".  General Information:  The scale should reflect your current level of pain. Unless you are specifically asked for the level of your worst pain, or your average pain. If you are asked for one of these two, then it should be understood that it is over the past 24 hours.  Levels 1 (one) through 5 (five) are described below, and can be treated as an outpatient. Ambulatory pain management facilities such as ours are more than adequate to treat these levels. Levels 6 (six) through 10 (ten) are also described below, however, these must be treated as a hospitalized patient. While levels 6 (six) and 7 (seven) may be evaluated at an urgent care facility, levels 8 (eight) through 10 (ten) constitute medical emergencies and as such, they belong in a hospital's emergency department. When having these levels (as described below), do not come to our office. Our facility is not equipped to manage these levels. Go directly to an urgent care facility or an emergency department to be evaluated.  Definitions:  Activities of Daily Living (ADL): Activities of daily living (ADL or ADLs) is a term used in healthcare to refer to people's daily self-care activities. Health professionals often use a person's ability or inability to perform ADLs as a measurement of their functional status, particularly in regard to people post injury, with disabilities and the elderly. There are two ADL levels: Basic and Instrumental. Basic  Activities of Daily Living (BADL  or BADLs) consist of self-care tasks that include: Bathing and showering; personal hygiene and grooming (including brushing/combing/styling hair); dressing; Toilet hygiene (getting to the toilet, cleaning oneself, and getting back up); eating and self-feeding (not including cooking or chewing and swallowing); functional mobility, often referred to as "transferring", as measured by the ability to walk, get in and out of bed, and get into and out of a chair; the broader definition (moving from one place to another while performing activities) is useful for people with different physical abilities who are still able to get around independently. Basic ADLs include the things many people do when they get up in the morning and get ready to go out of the house: get out of bed, go to the toilet, bathe, dress, groom, and eat. On the average, loss of function typically follows a particular order. Hygiene is the first to go, followed by loss of toilet use and locomotion. The last to go is the ability  to eat. When there is only one remaining area in which the person is independent, there is a 62.9% chance that it is eating and only a 3.5% chance that it is hygiene. Instrumental Activities of Daily Living (IADL or IADLs) are not necessary for fundamental functioning, but they let an individual live independently in a community. IADL consist of tasks that include: cleaning and maintaining the house; home establishment and maintenance; care of others (including selecting and supervising caregivers); care of pets; child rearing; managing money; managing financials (investments, etc.); meal preparation and cleanup; shopping for groceries and necessities; moving within the community; safety procedures and emergency responses; health management and maintenance (taking prescribed medications); and using the telephone or other form of communication.  Instructions:  Most patients tend to report  their pain as a combination of two factors, their physical pain and their psychosocial pain. This last one is also known as "suffering" and it is reflection of how physical pain affects you socially and psychologically. From now on, report them separately.  From this point on, when asked to report your pain level, report only your physical pain. Use the following table for reference.  Pain Clinic Pain Levels (0-5/10)  Pain Level Score  Description  No Pain 0   Mild pain 1 Nagging, annoying, but does not interfere with basic activities of daily living (ADL). Patients are able to eat, bathe, get dressed, toileting (being able to get on and off the toilet and perform personal hygiene functions), transfer (move in and out of bed or a chair without assistance), and maintain continence (able to control bladder and bowel functions). Blood pressure and heart rate are unaffected. A normal heart rate for a healthy adult ranges from 60 to 100 bpm (beats per minute).   Mild to moderate pain 2 Noticeable and distracting. Impossible to hide from other people. More frequent flare-ups. Still possible to adapt and function close to normal. It can be very annoying and may have occasional stronger flare-ups. With discipline, patients may get used to it and adapt.   Moderate pain 3 Interferes significantly with activities of daily living (ADL). It becomes difficult to feed, bathe, get dressed, get on and off the toilet or to perform personal hygiene functions. Difficult to get in and out of bed or a chair without assistance. Very distracting. With effort, it can be ignored when deeply involved in activities.   Moderately severe pain 4 Impossible to ignore for more than a few minutes. With effort, patients may still be able to manage work or participate in some social activities. Very difficult to concentrate. Signs of autonomic nervous system discharge are evident: dilated pupils (mydriasis); mild sweating (diaphoresis);  sleep interference. Heart rate becomes elevated (>115 bpm). Diastolic blood pressure (lower number) rises above 100 mmHg. Patients find relief in laying down and not moving.   Severe pain 5 Intense and extremely unpleasant. Associated with frowning face and frequent crying. Pain overwhelms the senses.  Ability to do any activity or maintain social relationships becomes significantly limited. Conversation becomes difficult. Pacing back and forth is common, as getting into a comfortable position is nearly impossible. Pain wakes you up from deep sleep. Physical signs will be obvious: pupillary dilation; increased sweating; goosebumps; brisk reflexes; cold, clammy hands and feet; nausea, vomiting or dry heaves; loss of appetite; significant sleep disturbance with inability to fall asleep or to remain asleep. When persistent, significant weight loss is observed due to the complete loss of appetite and sleep deprivation.  Blood  pressure and heart rate becomes significantly elevated. Caution: If elevated blood pressure triggers a pounding headache associated with blurred vision, then the patient should immediately seek attention at an urgent or emergency care unit, as these may be signs of an impending stroke.    Emergency Department Pain Levels (6-10/10)  Emergency Room Pain 6 Severely limiting. Requires emergency care and should not be seen or managed at an outpatient pain management facility. Communication becomes difficult and requires great effort. Assistance to reach the emergency department may be required. Facial flushing and profuse sweating along with potentially dangerous increases in heart rate and blood pressure will be evident.   Distressing pain 7 Self-care is very difficult. Assistance is required to transport, or use restroom. Assistance to reach the emergency department will be required. Tasks requiring coordination, such as bathing and getting dressed become very difficult.   Disabling pain 8  Self-care is no longer possible. At this level, pain is disabling. The individual is unable to do even the most "basic" activities such as walking, eating, bathing, dressing, transferring to a bed, or toileting. Fine motor skills are lost. It is difficult to think clearly.   Incapacitating pain 9 Pain becomes incapacitating. Thought processing is no longer possible. Difficult to remember your own name. Control of movement and coordination are lost.   The worst pain imaginable 10 At this level, most patients pass out from pain. When this level is reached, collapse of the autonomic nervous system occurs, leading to a sudden drop in blood pressure and heart rate. This in turn results in a temporary and dramatic drop in blood flow to the brain, leading to a loss of consciousness. Fainting is one of the body's self defense mechanisms. Passing out puts the brain in a calmed state and causes it to shut down for a while, in order to begin the healing process.    Summary: 1. Refer to this scale when providing [place] with your pain level. 2. Be accurate and careful when reporting your pain level. This will help with your care. 3. Over-reporting your pain level will lead to loss of credibility. 4. Even a level of 1/10 means that there is pain and will be treated at our facility. 5. High, inaccurate reporting will be documented as "Symptom Exaggeration", leading to loss of credibility and suspicions of possible secondary gains such as obtaining more narcotics, or wanting to appear disabled, for fraudulent reasons. 6. Only pain levels of 5 or below will be seen at our facility. 7. Pain levels of 6 and above will be sent to the Emergency Department and the appointment cancelled. ______________________________________________________________________________________________   BMI Assessment: Estimated body mass index is 50.36 kg/m as calculated from the following:   Height as of this encounter: 5' 10"  (1.778 m).    Weight as of this encounter: 351 lb (159.2 kg).  BMI interpretation table: BMI level Category Range association with higher incidence of chronic pain  <18 kg/m2 Underweight   18.5-24.9 kg/m2 Ideal body weight   25-29.9 kg/m2 Overweight Increased incidence by 20%  30-34.9 kg/m2 Obese (Class I) Increased incidence by 68%  35-39.9 kg/m2 Severe obesity (Class II) Increased incidence by 136%  >40 kg/m2 Extreme obesity (Class III) Increased incidence by 254%   Patient's current BMI Ideal Body weight  Body mass index is 50.36 kg/m. Ideal body weight: 68.5 kg (151 lb 0.2 oz) Adjusted ideal body weight: 104.8 kg (231 lb 0.2 oz)   BMI Readings from Last 4 Encounters:  01/28/18 50.36 kg/m  12/30/17 50.56 kg/m  12/01/17 50.36 kg/m  11/07/17 51.04 kg/m   Wt Readings from Last 4 Encounters:  01/28/18 (!) 351 lb (159.2 kg)  12/30/17 (!) 352 lb 6.4 oz (159.8 kg)  12/01/17 (!) 351 lb (159.2 kg)  11/07/17 (!) 355 lb 11.2 oz (161.3 kg)    Oxycodone - appa 10-325 x 2 months escribed to pharmacy to fill on 01/30/18 and 03/01/18

## 2018-01-28 NOTE — Patient Instructions (Addendum)
____________________________________________________________________________________________  Medication Rules  Purpose: To inform patients, and their family members, of our rules and regulations.  Applies to: All patients receiving prescriptions (written or electronic).  Pharmacy of record: Pharmacy where electronic prescriptions will be sent. If written prescriptions are taken to a different pharmacy, please inform the nursing staff. The pharmacy listed in the electronic medical record should be the one where you would like electronic prescriptions to be sent.  Electronic prescriptions: In compliance with the [place] Strengthen Opioid Misuse Prevention (STOP) Act of 2017 (Session Law 2017-74/H243), effective January 21, 2018, all controlled substances must be electronically prescribed. Calling prescriptions to the pharmacy will cease to exist.  Prescription refills: Only during scheduled appointments. Applies to all prescriptions.  NOTE: The following applies primarily to controlled substances (Opioid* Pain Medications).   Patient's responsibilities: 1. Pain Pills: Bring all pain pills to every appointment (except for procedure appointments). 2. Pill Bottles: Bring pills in original pharmacy bottle. Always bring the newest bottle. Bring bottle, even if empty. 3. Medication refills: You are responsible for knowing and keeping track of what medications you take and those you need refilled. The day before your appointment: write a list of all prescriptions that need to be refilled. The day of the appointment: give the list to the admitting nurse. Prescriptions will be written only during appointments. If you forget a medication: it will not be "Called in", "Faxed", or "electronically sent". You will need to get another appointment to get these prescribed. No early refills. Do not call asking to have your prescription filled early. 4. Prescription Accuracy: You are responsible for  carefully inspecting your prescriptions before leaving our office. Have the discharge nurse carefully go over each prescription with you, before taking them home. Make sure that your name is accurately spelled, that your address is correct. Check the name and dose of your medication to make sure it is accurate. Check the number of pills, and the written instructions to make sure they are clear and accurate. Make sure that you are given enough medication to last until your next medication refill appointment. 5. Taking Medication: Take medication as prescribed. When it comes to controlled substances, taking less pills or less frequently than prescribed is permitted and encouraged. Never take more pills than instructed. Never take medication more frequently than prescribed.  6. Inform other Doctors: Always inform, all of your healthcare providers, of all the medications you take. 7. Pain Medication from other Providers: You are not allowed to accept any additional pain medication from any other Doctor or Healthcare provider. There are two exceptions to this rule. (see below) In the event that you require additional pain medication, you are responsible for notifying us, as stated below. 8. Medication Agreement: You are responsible for carefully reading and following our Medication Agreement. This must be signed before receiving any prescriptions from our practice. Safely store a copy of your signed Agreement. Violations to the Agreement will result in no further prescriptions. (Additional copies of our Medication Agreement are available upon request.) 9. Laws, Rules, & Regulations: All patients are expected to follow all Federal and State Laws, Statutes, Rules, & Regulations. Ignorance of the Laws does not constitute a valid excuse. The use of any illegal substances is prohibited. 10. Adopted CDC guidelines & recommendations: Target dosing levels will be at or below 60 MME/day. Use of benzodiazepines** is not  recommended.  Exceptions: There are only two exceptions to the rule of not receiving pain medications from other Healthcare Providers. 1.   Exception #1 (Emergencies): In the event of an emergency (i.e.: accident requiring emergency care), you are allowed to receive additional pain medication. However, you are responsible for: As soon as you are able, call our office [phone], at any time of the day or night, and leave a message stating your name, the date and nature of the emergency, and the name and dose of the medication prescribed. In the event that your call is answered by a member of our staff, make sure to document and save the date, time, and the name of the person that took your information.  2. Exception #2 (Planned Surgery): In the event that you are scheduled by another doctor or dentist to have any type of surgery or procedure, you are allowed (for a period no longer than 30 days), to receive additional pain medication, for the acute post-op pain. However, in this case, you are responsible for picking up a copy of our "Post-op Pain Management for Surgeons" handout, and giving it to your surgeon or dentist. This document is available at our office, and does not require an appointment to obtain it. Simply go to our office during business hours (Monday-Thursday from 8:00 AM to 4:00 PM) (Friday 8:00 AM to 12:00 Noon) or if you have a scheduled appointment with [place], prior to your surgery, and ask for it by name. In addition, you will need to provide [place] with your name, name of your surgeon, type of surgery, and date of procedure or surgery.  *Opioid medications include: morphine, codeine, oxycodone, oxymorphone, hydrocodone, hydromorphone, meperidine, tramadol, tapentadol, buprenorphine, fentanyl, methadone. **Benzodiazepine medications include: diazepam (Valium), alprazolam (Xanax), clonazepam (Klonopine), lorazepam (Ativan), clorazepate (Tranxene), chlordiazepoxide (Librium), estazolam (Prosom),  oxazepam (Serax), temazepam (Restoril), triazolam (Halcion) (Last updated: 03/20/2017) ____________________________________________________________________________________________    ______________________________________________________________________________________________  Specialty Pain Scale  Introduction:  There are significant differences in how pain is reported. The word pain usually refers to physical pain, but it is also a common synonym of suffering. The medical community uses a scale from 0 (zero) to 10 (ten) to report pain level. Zero (0) is described as "no pain", while ten (10) is described as "the worse pain you can imagine". The problem with this scale is that physical pain is reported along with suffering. Suffering refers to mental pain, or more often yet it refers to any unpleasant feeling, emotion or aversion associated with the perception of harm or threat of harm. It is the psychological component of pain.  Pain Specialists prefer to separate the two components. The pain scale used by this practice is the Verbal Numerical Rating Scale (VNRS-11). This scale is for the physical pain only. DO NOT INCLUDE how your pain psychologically affects you. This scale is for adults 75 years of age and older. It has 11 (eleven) levels. The 1st level is 0/10. This means: "right now, I have no pain". In the context of pain management, it also means: "right now, my physical pain is under control with the current therapy".  General Information:  The scale should reflect your current level of pain. Unless you are specifically asked for the level of your worst pain, or your average pain. If you are asked for one of these two, then it should be understood that it is over the past 24 hours.  Levels 1 (one) through 5 (five) are described below, and can be treated as an outpatient. Ambulatory pain management facilities such as ours are more than adequate to treat these levels. Levels 6 (six) through  10 (ten) are also described below, however, these must be treated as a hospitalized patient. While levels 6 (six) and 7 (seven) may be evaluated at an urgent care facility, levels 8 (eight) through 10 (ten) constitute medical emergencies and as such, they belong in a hospital's emergency department. When having these levels (as described below), do not come to our office. Our facility is not equipped to manage these levels. Go directly to an urgent care facility or an emergency department to be evaluated.  Definitions:  Activities of Daily Living (ADL): Activities of daily living (ADL or ADLs) is a term used in healthcare to refer to people's daily self-care activities. Health professionals often use a person's ability or inability to perform ADLs as a measurement of their functional status, particularly in regard to people post injury, with disabilities and the elderly. There are two ADL levels: Basic and Instrumental. Basic Activities of Daily Living (BADL  or BADLs) consist of self-care tasks that include: Bathing and showering; personal hygiene and grooming (including brushing/combing/styling hair); dressing; Toilet hygiene (getting to the toilet, cleaning oneself, and getting back up); eating and self-feeding (not including cooking or chewing and swallowing); functional mobility, often referred to as "transferring", as measured by the ability to walk, get in and out of bed, and get into and out of a chair; the broader definition (moving from one place to another while performing activities) is useful for people with different physical abilities who are still able to get around independently. Basic ADLs include the things many people do when they get up in the morning and get ready to go out of the house: get out of bed, go to the toilet, bathe, dress, groom, and eat. On the average, loss of function typically follows a particular order. Hygiene is the first to go, followed by loss of toilet use and  locomotion. The last to go is the ability to eat. When there is only one remaining area in which the person is independent, there is a 62.9% chance that it is eating and only a 3.5% chance that it is hygiene. Instrumental Activities of Daily Living (IADL or IADLs) are not necessary for fundamental functioning, but they let an individual live independently in a community. IADL consist of tasks that include: cleaning and maintaining the house; home establishment and maintenance; care of others (including selecting and supervising caregivers); care of pets; child rearing; managing money; managing financials (investments, etc.); meal preparation and cleanup; shopping for groceries and necessities; moving within the community; safety procedures and emergency responses; health management and maintenance (taking prescribed medications); and using the telephone or other form of communication.  Instructions:  Most patients tend to report their pain as a combination of two factors, their physical pain and their psychosocial pain. This last one is also known as "suffering" and it is reflection of how physical pain affects you socially and psychologically. From now on, report them separately.  From this point on, when asked to report your pain level, report only your physical pain. Use the following table for reference.  Pain Clinic Pain Levels (0-5/10)  Pain Level Score  Description  No Pain 0   Mild pain 1 Nagging, annoying, but does not interfere with basic activities of daily living (ADL). Patients are able to eat, bathe, get dressed, toileting (being able to get on and off the toilet and perform personal hygiene functions), transfer (move in and out of bed or a chair without assistance), and maintain continence (able to control bladder  and bowel functions). Blood pressure and heart rate are unaffected. A normal heart rate for a healthy adult ranges from 60 to 100 bpm (beats per minute).   Mild to moderate pain  2 Noticeable and distracting. Impossible to hide from other people. More frequent flare-ups. Still possible to adapt and function close to normal. It can be very annoying and may have occasional stronger flare-ups. With discipline, patients may get used to it and adapt.   Moderate pain 3 Interferes significantly with activities of daily living (ADL). It becomes difficult to feed, bathe, get dressed, get on and off the toilet or to perform personal hygiene functions. Difficult to get in and out of bed or a chair without assistance. Very distracting. With effort, it can be ignored when deeply involved in activities.   Moderately severe pain 4 Impossible to ignore for more than a few minutes. With effort, patients may still be able to manage work or participate in some social activities. Very difficult to concentrate. Signs of autonomic nervous system discharge are evident: dilated pupils (mydriasis); mild sweating (diaphoresis); sleep interference. Heart rate becomes elevated (>115 bpm). Diastolic blood pressure (lower number) rises above 100 mmHg. Patients find relief in laying down and not moving.   Severe pain 5 Intense and extremely unpleasant. Associated with frowning face and frequent crying. Pain overwhelms the senses.  Ability to do any activity or maintain social relationships becomes significantly limited. Conversation becomes difficult. Pacing back and forth is common, as getting into a comfortable position is nearly impossible. Pain wakes you up from deep sleep. Physical signs will be obvious: pupillary dilation; increased sweating; goosebumps; brisk reflexes; cold, clammy hands and feet; nausea, vomiting or dry heaves; loss of appetite; significant sleep disturbance with inability to fall asleep or to remain asleep. When persistent, significant weight loss is observed due to the complete loss of appetite and sleep deprivation.  Blood pressure and heart rate becomes significantly elevated. Caution:  If elevated blood pressure triggers a pounding headache associated with blurred vision, then the patient should immediately seek attention at an urgent or emergency care unit, as these may be signs of an impending stroke.    Emergency Department Pain Levels (6-10/10)  Emergency Room Pain 6 Severely limiting. Requires emergency care and should not be seen or managed at an outpatient pain management facility. Communication becomes difficult and requires great effort. Assistance to reach the emergency department may be required. Facial flushing and profuse sweating along with potentially dangerous increases in heart rate and blood pressure will be evident.   Distressing pain 7 Self-care is very difficult. Assistance is required to transport, or use restroom. Assistance to reach the emergency department will be required. Tasks requiring coordination, such as bathing and getting dressed become very difficult.   Disabling pain 8 Self-care is no longer possible. At this level, pain is disabling. The individual is unable to do even the most "basic" activities such as walking, eating, bathing, dressing, transferring to a bed, or toileting. Fine motor skills are lost. It is difficult to think clearly.   Incapacitating pain 9 Pain becomes incapacitating. Thought processing is no longer possible. Difficult to remember your own name. Control of movement and coordination are lost.   The worst pain imaginable 10 At this level, most patients pass out from pain. When this level is reached, collapse of the autonomic nervous system occurs, leading to a sudden drop in blood pressure and heart rate. This in turn results in a temporary and dramatic drop in  blood flow to the brain, leading to a loss of consciousness. Fainting is one of the body's self defense mechanisms. Passing out puts the brain in a calmed state and causes it to shut down for a while, in order to begin the healing process.    Summary: 1. Refer to this  scale when providing [place] with your pain level. 2. Be accurate and careful when reporting your pain level. This will help with your care. 3. Over-reporting your pain level will lead to loss of credibility. 4. Even a level of 1/10 means that there is pain and will be treated at our facility. 5. High, inaccurate reporting will be documented as "Symptom Exaggeration", leading to loss of credibility and suspicions of possible secondary gains such as obtaining more narcotics, or wanting to appear disabled, for fraudulent reasons. 6. Only pain levels of 5 or below will be seen at our facility. 7. Pain levels of 6 and above will be sent to the Emergency Department and the appointment cancelled. ______________________________________________________________________________________________   BMI Assessment: Estimated body mass index is 50.36 kg/m as calculated from the following:   Height as of this encounter: 5\' 10"  (1.778 m).   Weight as of this encounter: 351 lb (159.2 kg).  BMI interpretation table: BMI level Category Range association with higher incidence of chronic pain  <18 kg/m2 Underweight   18.5-24.9 kg/m2 Ideal body weight   25-29.9 kg/m2 Overweight Increased incidence by 20%  30-34.9 kg/m2 Obese (Class I) Increased incidence by 68%  35-39.9 kg/m2 Severe obesity (Class II) Increased incidence by 136%  >40 kg/m2 Extreme obesity (Class III) Increased incidence by 254%   Patient's current BMI Ideal Body weight  Body mass index is 50.36 kg/m. Ideal body weight: 68.5 kg (151 lb 0.2 oz) Adjusted ideal body weight: 104.8 kg (231 lb 0.2 oz)   BMI Readings from Last 4 Encounters:  01/28/18 50.36 kg/m  12/30/17 50.56 kg/m  12/01/17 50.36 kg/m  11/07/17 51.04 kg/m   Wt Readings from Last 4 Encounters:  01/28/18 (!) 351 lb (159.2 kg)  12/30/17 (!) 352 lb 6.4 oz (159.8 kg)  12/01/17 (!) 351 lb (159.2 kg)  11/07/17 (!) 355 lb 11.2 oz (161.3 kg)    Oxycodone - appa 10-325 x 2 months  escribed to pharmacy to fill on 01/30/18 and 03/01/18

## 2018-02-01 LAB — TOXASSURE SELECT 13 (MW), URINE

## 2018-02-24 ENCOUNTER — Ambulatory Visit (INDEPENDENT_AMBULATORY_CARE_PROVIDER_SITE_OTHER): Payer: Medicaid Other | Admitting: Pharmacist

## 2018-02-24 ENCOUNTER — Other Ambulatory Visit (HOSPITAL_COMMUNITY): Payer: Self-pay | Admitting: Internal Medicine

## 2018-02-24 DIAGNOSIS — Z86711 Personal history of pulmonary embolism: Secondary | ICD-10-CM | POA: Diagnosis not present

## 2018-02-24 DIAGNOSIS — Z1231 Encounter for screening mammogram for malignant neoplasm of breast: Secondary | ICD-10-CM

## 2018-02-24 DIAGNOSIS — Z5181 Encounter for therapeutic drug level monitoring: Secondary | ICD-10-CM | POA: Diagnosis not present

## 2018-02-24 LAB — POCT INR: INR: 3 (ref 2.0–3.0)

## 2018-02-24 NOTE — Patient Instructions (Signed)
Description   Continue 10mg  daily except 15mg  on Tuesdays and Saturdays Recheck in 4 weeks

## 2018-03-09 ENCOUNTER — Encounter (HOSPITAL_COMMUNITY): Payer: Self-pay

## 2018-03-09 ENCOUNTER — Ambulatory Visit (HOSPITAL_COMMUNITY)
Admission: RE | Admit: 2018-03-09 | Discharge: 2018-03-09 | Disposition: A | Discharge status: Home / Self Care | Payer: Medicaid Other | Source: Ambulatory Visit | Attending: Internal Medicine | Admitting: Internal Medicine

## 2018-03-09 DIAGNOSIS — Z1231 Encounter for screening mammogram for malignant neoplasm of breast: Secondary | ICD-10-CM | POA: Diagnosis not present

## 2018-03-11 ENCOUNTER — Encounter: Payer: Self-pay | Admitting: Obstetrics and Gynecology

## 2018-03-11 ENCOUNTER — Other Ambulatory Visit (HOSPITAL_COMMUNITY)
Admission: RE | Admit: 2018-03-11 | Discharge: 2018-03-11 | Disposition: A | Discharge status: Home / Self Care | Payer: Medicaid Other | Source: Ambulatory Visit | Attending: Obstetrics and Gynecology | Admitting: Obstetrics and Gynecology

## 2018-03-11 ENCOUNTER — Ambulatory Visit: Payer: Medicaid Other | Admitting: Obstetrics and Gynecology

## 2018-03-11 VITALS — BP 132/81 | HR 99 | Ht 70.0 in | Wt 358.4 lb

## 2018-03-11 DIAGNOSIS — Z01419 Encounter for gynecological examination (general) (routine) without abnormal findings: Secondary | ICD-10-CM

## 2018-03-11 DIAGNOSIS — Z202 Contact with and (suspected) exposure to infections with a predominantly sexual mode of transmission: Secondary | ICD-10-CM | POA: Diagnosis not present

## 2018-03-11 DIAGNOSIS — Z0000 Encounter for general adult medical examination without abnormal findings: Secondary | ICD-10-CM | POA: Diagnosis not present

## 2018-03-11 NOTE — Progress Notes (Signed)
[name] [name] is a 43 y.o. [phone] female here for a routine annual gynecologic exam. She has not GYN complaints today. Desires STD testing. Sexual active, condoms for contraception. Cycles are monthly, not heavy, last @ 5 days.  Denies abnormal vaginal bleeding, discharge, pelvic pain, problems with intercourse or other gynecologic concerns.    Gynecologic History Patient's last menstrual period was 02/24/2018. Contraception: condoms Last Pap: 2016. Results were: normal Last mammogram: 2/20. Results were: pending  Obstetric History OB History  Gravida Para Term Preterm AB Living  4 1 1   3 1   SAB TAB Ectopic Multiple Live Births  3     0 1    # Outcome Date GA Lbr Len/2nd Weight Sex Delivery Anes PTL Lv  4 Term 02/24/15 [redacted]w[redacted]d 09:16 / 01:44 7 lb 14.8 oz (3.595 kg) F Vag-Spont EPI  LIV  3 SAB 2014          2 SAB 2013          1 SAB 2012            Past Medical History:  Diagnosis Date  . Abdominal cramps 08/30/2015  . Anemia   . Blood transfusion without reported diagnosis   . Closed left ankle fracture    August 30 2012  . Clotting disorder ([place])   . DVT (deep venous thrombosis) ([facility])    L leg  . Fibroid tumor   . Hidradenitis   . High blood pressure   . Hypertension   . Obesity   . OSA on CPAP   . Oxygen deficiency   . Pregnancy    09/20/14 17 weeks  . Pulmonary embolism ([place]) 04/2013  . Sleep apnea   . Tobacco use disorder     Past Surgical History:  Procedure Laterality Date  . ABSCESS DRAINAGE Bilateral 01/10/15  . CYST REMOVAL TRUNK    . IVC FILTER PLACEMENT ([facility] HX)      Current Outpatient Medications on File Prior to Visit  Medication Sig Dispense Refill  . amLODipine (NORVASC) 10 MG tablet Take 1 tablet (10 mg total) by mouth daily. 90 tablet 3  . lisinopril (PRINIVIL,ZESTRIL) 40 MG tablet Take 1 tablet (40 mg total) by mouth daily. 90 tablet 3  . oxyCODONE-acetaminophen (PERCOCET) 10-325 MG tablet Take 1 tablet by mouth 2 (two) times daily as needed  for pain. 60 tablet 0  . triamterene-hydrochlorothiazide (MAXZIDE-25) 37.5-25 MG tablet TAKE 1 TABLET BY MOUTH ONCE A DAY. 30 tablet 3  . warfarin (COUMADIN) 10 MG tablet Take 1 tablet (10 mg total) by mouth daily. 34 tablet 3  . warfarin (COUMADIN) 2.5 MG tablet TAKE 2 TABLETS BY MOUTH DAILY ALONG WITH 10 MG TABLET FOR A TOTAL OF 15 MG A DAY. 60 tablet 3  . DULoxetine (CYMBALTA) 30 MG capsule Take 1 capsule (30 mg total) by mouth daily for 30 days, THEN 2 capsules (60 mg total) daily. 150 capsule 0  . norethindrone (MICRONOR,CAMILA,ERRIN) 0.35 MG tablet TAKE ONE TABLET BY MOUTH ONCE DAILY (Patient not taking: Reported on 03/11/2018) 28 tablet 3   Current Facility-Administered Medications on File Prior to Visit  Medication Dose Route Frequency Provider Last Rate Last Dose  . 0.9 %  sodium chloride infusion   Intravenous Continuous Baird Cancer, PA-C 20 mL/hr at 12/22/15 1430      Allergies  Allergen Reactions  . Sulfa Antibiotics Itching, Swelling and Rash    Lips swelling    Social History   Socioeconomic History  .  Marital status: Widowed    Spouse name: Not on file  . Number of children: Not on file  . Years of education: Not on file  . Highest education level: Not on file  Occupational History  . Not on file  Social Needs  . Financial resource strain: Not on file  . Food insecurity:    Worry: Not on file    Inability: Not on file  . Transportation needs:    Medical: Not on file    Non-medical: Not on file  Tobacco Use  . Smoking status: Current Some Day Smoker    Packs/day: 0.25    Years: 18.00    Pack years: 4.50    Types: Cigarettes  . Smokeless tobacco: Never Used  . Tobacco comment: smokes 3 cig when she smokes  Substance and Sexual Activity  . Alcohol use: Yes    Alcohol/week: 2.0 standard drinks    Types: 2 Cans of beer per week  . Drug use: No  . Sexual activity: Not Currently    Birth control/protection: Pill  Lifestyle  . Physical activity:     Days per week: Not on file    Minutes per session: Not on file  . Stress: Not on file  Relationships  . Social connections:    Talks on phone: Not on file    Gets together: Not on file    Attends religious service: Not on file    Active member of club or organization: Not on file    Attends meetings of clubs or organizations: Not on file    Relationship status: Not on file  . Intimate partner violence:    Fear of current or ex partner: Not on file    Emotionally abused: Not on file    Physically abused: Not on file    Forced sexual activity: Not on file  Other Topics Concern  . 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    Has a 42 year old Cosmos.   Live with parents.   Was working 5 days a weeks.   Not working right now.    Attends church.     Family History  Problem Relation Age of Onset  . Hypertension Mother   . Diabetes Paternal Aunt   . Diabetes Paternal Uncle   . Other Father        blood clots  . Cancer Maternal Grandmother   . Heart attack Paternal Grandmother     The following portions of the patient's history were reviewed and updated as appropriate: allergies, current medications, past family history, past medical history, past social history, past surgical history and problem list.  Review of Systems Pertinent items noted in HPI and remainder of comprehensive ROS otherwise negative.   Objective:  BP 132/81 (BP Location: Left Arm, Patient Position: Sitting, Cuff Size: Normal)   Pulse 99   Ht 5\' 10"  (1.778 m)   Wt (!) 358 lb 6.4 oz (162.6 kg)   LMP 02/24/2018   BMI 51.43 kg/m  CONSTITUTIONAL: Well-developed, well-nourished female in no acute distress.  HENT:  Normocephalic, atraumatic, External right and left ear normal. Oropharynx is clear and moist EYES: Conjunctivae and EOM are normal. Pupils are equal, round, and reactive to light. No scleral icterus.  NECK: Normal range of motion, supple, no  masses.  Normal thyroid.  SKIN: Skin is warm and dry. No rash noted. Not diaphoretic. No erythema. No  pallor. [place]: Alert and oriented to person, place, and time. Normal reflexes, muscle tone coordination. No cranial nerve deficit noted. PSYCHIATRIC: Normal mood and affect. Normal behavior. Normal judgment and thought content. CARDIOVASCULAR: Normal heart rate noted, regular rhythm RESPIRATORY: Clear to auscultation bilaterally. Effort and breath sounds normal, no problems with respiration noted. BREASTS: Symmetric in size. No masses, skin changes, nipple drainage, or lymphadenopathy. ABDOMEN: Soft, normal bowel sounds, no distention noted.  No tenderness, rebound or guarding.  PELVIC: Normal appearing external genitalia; normal appearing vaginal mucosa and cervix.  No abnormal discharge noted.  Pap smear obtained.  Normal uterine size, no other palpable masses, no uterine or adnexal tenderness. Limited by pt habitus. MUSCULOSKELETAL: Normal range of motion. No tenderness.  No cyanosis, clubbing, or edema.  2+ distal pulses.   Assessment:  Annual gynecologic examination with pap smear STD exposure Plan:  Will follow up results of pap smear and manage accordingly. Mammogram completed yesterday. Information on IUD's provided to pt. Pt to review and call back with choice and schedule insertion. Routine preventative health maintenance measures emphasized. Please refer to After Visit Summary for other counseling recommendations.    [name] Milroy, MD, [place] Attending [place] for [facility], [place]

## 2018-03-11 NOTE — Patient Instructions (Signed)

## 2018-03-12 LAB — HIV ANTIBODY (ROUTINE TESTING W REFLEX): HIV Screen 4th Generation wRfx: NONREACTIVE

## 2018-03-12 LAB — RPR: RPR: NONREACTIVE

## 2018-03-12 LAB — HEPATITIS C ANTIBODY: Hep C Virus Ab: <0.1 s/co ratio (ref 0.0–0.9)

## 2018-03-16 ENCOUNTER — Other Ambulatory Visit (HOSPITAL_COMMUNITY): Payer: Self-pay | Admitting: Internal Medicine

## 2018-03-16 DIAGNOSIS — R928 Other abnormal and inconclusive findings on diagnostic imaging of breast: Secondary | ICD-10-CM

## 2018-03-16 LAB — CYTOLOGY - PAP
CHLAMYDIA, DNA PROBE: NEGATIVE
Diagnosis: NEGATIVE
HPV: NOT DETECTED
Neisseria Gonorrhea: NEGATIVE

## 2018-03-24 ENCOUNTER — Other Ambulatory Visit: Payer: Self-pay

## 2018-03-24 ENCOUNTER — Ambulatory Visit (HOSPITAL_COMMUNITY)
Admission: RE | Admit: 2018-03-24 | Discharge: 2018-03-24 | Disposition: A | Discharge status: Home / Self Care | Payer: Medicaid Other | Source: Ambulatory Visit | Attending: Internal Medicine | Admitting: Internal Medicine

## 2018-03-24 ENCOUNTER — Telehealth: Payer: Self-pay

## 2018-03-24 ENCOUNTER — Ambulatory Visit (INDEPENDENT_AMBULATORY_CARE_PROVIDER_SITE_OTHER): Payer: Medicaid Other | Admitting: *Deleted

## 2018-03-24 ENCOUNTER — Encounter: Payer: Self-pay | Admitting: Gastroenterology

## 2018-03-24 ENCOUNTER — Ambulatory Visit: Payer: Medicaid Other | Admitting: Gastroenterology

## 2018-03-24 ENCOUNTER — Other Ambulatory Visit (HOSPITAL_COMMUNITY): Payer: Self-pay | Admitting: Internal Medicine

## 2018-03-24 VITALS — BP 134/83 | HR 94 | Temp 97.0°F | Ht 70.0 in | Wt 371.4 lb

## 2018-03-24 DIAGNOSIS — D500 Iron deficiency anemia secondary to blood loss (chronic): Secondary | ICD-10-CM

## 2018-03-24 DIAGNOSIS — R928 Other abnormal and inconclusive findings on diagnostic imaging of breast: Secondary | ICD-10-CM

## 2018-03-24 DIAGNOSIS — Z7901 Long term (current) use of anticoagulants: Secondary | ICD-10-CM | POA: Diagnosis not present

## 2018-03-24 DIAGNOSIS — Z5181 Encounter for therapeutic drug level monitoring: Secondary | ICD-10-CM

## 2018-03-24 DIAGNOSIS — Z86711 Personal history of pulmonary embolism: Secondary | ICD-10-CM

## 2018-03-24 LAB — POCT INR: INR: 3 (ref 2.0–3.0)

## 2018-03-24 MED ORDER — CLENPIQ 10-3.5-12 MG-GM -GM/160ML PO SOLN: 1.0000 | Freq: Once | ORAL | 0 refills | Status: AC

## 2018-03-24 NOTE — Patient Instructions (Signed)
Continue 10mg  daily except 15mg  on Tuesdays and Saturdays Recheck in 5 weeks Pending colonoscopy on 05/11/18

## 2018-03-24 NOTE — Progress Notes (Signed)
Primary Care Physician:  Rosita Fire, MD Referring provider: Dr. Zoila Shutter.  Primary Gastroenterologist:  Garfield Cornea, MD   Chief Complaint  Patient presents with  . IDA    HPI:  [name] [name] is a 43 y.o. female here at the request of Dr. Mathis Dad Higgs,  for further evaluation of iron deficiency anemia.  Patient has a history of saddle PE with right heart strain back in April 2015, status post IVC filter placement and chronic Coumadin, IDA, menorrhagia, morbid obesity.  Has required IV iron infusions in the past, at least 2-3 times per year since being on anticoagulation.  Patient is followed at the Coumadin clinic in [place]. She received 2 iron infusions back in October.  Last labs in December.  Prior to iron infusions her ferritin was down to 15, hemoglobin down to 11.4.  She has regular menstrual cycles, lasting for five days, first 3 days very heavy. Having to change pad/tampon every 30-45 minutes. Sometimes blood clots.   Recently saw her gyn. With next menstrual cycle she is going to have IUD placed. She has been off birth control pill for 3 months.   She denies brbpr, melena, constipation, diarrhea, abdominal pain, dysphagia, vomiting.  She has occasional heartburn.  No prior colonoscopy.  No known family history of colon cancer.  Maternal grandmother had cancer, patient is not sure what kind.  She will ask her mother and let us know if it is colon cancer.  Patient is having labs in a couple weeks with her PCP.   Current Outpatient Medications  Medication Sig Dispense Refill  . amLODipine (NORVASC) 10 MG tablet Take 1 tablet (10 mg total) by mouth daily. 90 tablet 3  . DULoxetine (CYMBALTA) 30 MG capsule Take 1 capsule (30 mg total) by mouth daily for 30 days, THEN 2 capsules (60 mg total) daily. 150 capsule 0  . lisinopril (PRINIVIL,ZESTRIL) 40 MG tablet Take 1 tablet (40 mg total) by mouth daily. 90 tablet 3  . oxyCODONE-acetaminophen (PERCOCET) 10-325 MG tablet Take 1  tablet by mouth 2 (two) times daily as needed for pain. 60 tablet 0  . triamterene-hydrochlorothiazide (MAXZIDE-25) 37.5-25 MG tablet TAKE 1 TABLET BY MOUTH ONCE A DAY. 30 tablet 3  . warfarin (COUMADIN) 10 MG tablet Take 1 tablet (10 mg total) by mouth daily. 34 tablet 3  . warfarin (COUMADIN) 2.5 MG tablet TAKE 2 TABLETS BY MOUTH DAILY ALONG WITH 10 MG TABLET FOR A TOTAL OF 15 MG A DAY. 60 tablet 3   No current facility-administered medications for this visit.    Facility-Administered Medications Ordered in Other Visits  Medication Dose Route Frequency Provider Last Rate Last Dose  . 0.9 %  sodium chloride infusion   Intravenous Continuous Baird Cancer, PA-C 20 mL/hr at 12/22/15 1430      Allergies as of 03/24/2018 - Review Complete 03/24/2018  Allergen Reaction Noted  . Sulfa antibiotics Itching, Swelling, and Rash 09/07/2011    Past Medical History:  Diagnosis Date  . Abdominal cramps 08/30/2015  . Anemia   . Blood transfusion without reported diagnosis   . Closed left ankle fracture    August 30 2012  . Clotting disorder ([place])   . DVT (deep venous thrombosis) ([facility])    L leg  . Fibroid tumor   . Hidradenitis   . High blood pressure   . Hypertension   . Obesity   . OSA on CPAP   . Oxygen deficiency   . Pregnancy  09/20/14 17 weeks  . Pulmonary embolism ([place]) 04/2013  . Sleep apnea   . Tobacco use disorder     Past Surgical History:  Procedure Laterality Date  . ABSCESS DRAINAGE Bilateral 01/10/15  . CYST REMOVAL TRUNK    . IVC FILTER PLACEMENT ([facility] HX)      Family History  Problem Relation Age of Onset  . Hypertension Mother   . Diabetes Paternal Aunt   . Diabetes Paternal Uncle   . Other Father        blood clots  . Cancer Maternal Grandmother   . Heart attack Paternal Grandmother   . Colon cancer Neg Hx     Social History   Socioeconomic History  . Marital status: Widowed    Spouse name: Not on file  . Number of children: Not on file  . Years  of education: Not on file  . Highest education level: Not on file  Occupational History  . Not on file  Social Needs  . Financial resource strain: Not on file  . Food insecurity:    Worry: Not on file    Inability: Not on file  . Transportation needs:    Medical: Not on file    Non-medical: Not on file  Tobacco Use  . Smoking status: Current Some Day Smoker    Packs/day: 0.25    Years: 18.00    Pack years: 4.50    Types: Cigarettes  . Smokeless tobacco: Never Used  . Tobacco comment: smokes 3 cig when she smokes  Substance and Sexual Activity  . Alcohol use: Yes    Alcohol/week: 0.0 standard drinks    Comment: twice a month  . Drug use: No  . Sexual activity: Not Currently    Birth control/protection: Pill  Lifestyle  . Physical activity:    Days per week: Not on file    Minutes per session: Not on file  . Stress: Not on file  Relationships  . Social connections:    Talks on phone: Not on file    Gets together: Not on file    Attends religious service: Not on file    Active member of club or organization: Not on file    Attends meetings of clubs or organizations: Not on file    Relationship status: Not on file  . Intimate partner violence:    Fear of current or ex partner: Not on file    Emotionally abused: Not on file    Physically abused: Not on file    Forced sexual activity: Not on file  Other Topics Concern  . 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    Has a 42 year old Isabela.   Live with parents.   Was working 5 days a weeks.   Not working right now.    Attends church.       ROS:  General: Negative for anorexia, weight loss, fever, chills, fatigue, weakness. Eyes: Negative for vision changes.  ENT: Negative for hoarseness, difficulty swallowing , nasal congestion. CV: Negative for chest pain, angina, palpitations, dyspnea on exertion, peripheral edema.  Respiratory: Negative for dyspnea at  rest, dyspnea on exertion, cough, sputum, wheezing.  GI: See history of present illness. GU:  Negative for dysuria, hematuria, urinary incontinence, urinary frequency, nocturnal urination.  MS: Chronic for joint pain, low back pain.  Derm: Negative for rash or itching.  Neuro: Negative for weakness, abnormal  sensation, seizure, frequent headaches, memory loss, confusion.  Psych: Negative for anxiety, depression, suicidal ideation, hallucinations.  Endo: Negative for unusual weight change.  Heme: Negative for bruising or bleeding. Allergy: Negative for rash or hives.    Physical Examination:  BP 134/83   Pulse 94   Temp (!) 97 F (36.1 C) (Oral)   Ht 5\' 10"  (1.778 m)   Wt (!) 371 lb 6.4 oz (168.5 kg)   LMP 02/24/2018   BMI 53.29 kg/m    General: Well-nourished, well-developed in no acute distress.  Head: Normocephalic, atraumatic.   Eyes: Conjunctiva pink, no icterus. Mouth: Oropharyngeal mucosa moist and pink , no lesions erythema or exudate. Neck: Supple without thyromegaly, masses, or lymphadenopathy.  Lungs: Clear to auscultation bilaterally.  Heart: Regular rate and rhythm, no murmurs rubs or gallops.  Abdomen: Bowel sounds are normal, nontender, nondistended, no hepatosplenomegaly or masses, no abdominal bruits or    hernia , no rebound or guarding.   Rectal: Not performed Extremities: No lower extremity edema. No clubbing or deformities.  Neuro: Alert and oriented x 4 , grossly normal neurologically.  Skin: Warm and dry, no rash or jaundice.   Psych: Alert and cooperative, normal mood and affect.  Labs: Lab Results  Component Value Date   CREATININE 0.86 12/29/2017   BUN 17 12/29/2017   NA 137 12/29/2017   K 4.3 12/29/2017   CL 103 12/29/2017   CO2 28 12/29/2017   Lab Results  Component Value Date   ALT 12 12/29/2017   AST 14 (L) 12/29/2017   ALKPHOS 66 12/29/2017   BILITOT 0.2 (L) 12/29/2017   Lab Results  Component Value Date   WBC 7.0 12/29/2017    HGB 12.1 12/29/2017   HCT 39.0 12/29/2017   MCV 91.1 12/29/2017   PLT 311 12/29/2017   Lab Results  Component Value Date   IRON 70 04/03/2017   TIBC 322 04/03/2017   FERRITIN 133 12/29/2017     Imaging Studies: Mm 3d Screen Breast Bilateral  Result Date: 03/10/2018 CLINICAL DATA:  Screening. EXAM: DIGITAL SCREENING BILATERAL MAMMOGRAM WITH TOMO AND CAD COMPARISON:  Previous exam(s). ACR Breast Density Category b: There are scattered areas of fibroglandular density. FINDINGS: In the left breast, a possible mass warrants further evaluation. In the right breast, no findings suspicious for malignancy. Images were processed with CAD. IMPRESSION: Further evaluation is suggested for possible mass in the left breast. RECOMMENDATION: Diagnostic mammogram and possibly ultrasound of the left breast. (Code:[id number]-L-42M) The patient will be contacted regarding the findings, and additional imaging will be scheduled. BI-RADS CATEGORY  0: Incomplete. Need additional imaging evaluation and/or prior mammograms for comparison. Electronically Signed   By: Evangeline Dakin M.D.   On: 03/10/2018 12:01

## 2018-03-24 NOTE — Telephone Encounter (Signed)
[name] [name], will you please assist [place] with Lovenox bridge for upcoming colonoscopy on 05/11/18?

## 2018-03-24 NOTE — Patient Instructions (Signed)
Please have your labs done when you see Dr. Legrand Rams. Call me if you have not heard about results/instructions within a week of having labs done, to verify we received results.   Colonoscopy as scheduled. See separate instructions.   We will work with coumadin clinic to make arrangements for "lovenox bridge" around the time of your colonoscopy.

## 2018-03-24 NOTE — Telephone Encounter (Signed)
[name] [name], she is scheduled for TCS w/Propofol w/RMR 05/11/18 at 2:30pm. Please give advice for holding Coumadin and starting Lovenox bridge.

## 2018-03-24 NOTE — Telephone Encounter (Signed)
Tried to call pt to inform of pre-op appt 05/05/18 at 1:15pm, no answer, LMOVM. Letter mailed.

## 2018-03-24 NOTE — Telephone Encounter (Signed)
Sure. Clarnce Flock her today.  Have her scheduled to come back 04/28/18 for INR check and give her all instruction on stopping coumadin and starting Lovenox for colonoscopy.  She is aware. Thanks, Lattie Haw

## 2018-03-24 NOTE — Progress Notes (Signed)
CC'D TO PCP °

## 2018-03-24 NOTE — Assessment & Plan Note (Signed)
Very pleasant 43 year old female with morbid obesity, chronic anticoagulation for unprovoked DVT/pulmonary embolus with right heart strain in 2015 who presents for further evaluation of iron deficiency anemia requiring IV iron infusions.  Patient has required several infusions per year since 2016.  Never required blood transfusion.  Followed by hematology.  She continues to have heavy menses in the setting of anticoagulation.  In the process of transitioning from oral birth control to IUD.  Suspect IDA related to heavy menstrual loss but we need to consider colonoscopy.  Plans for colonoscopy with deep sedation in the near future.  We will work with Coumadin clinic for Lovenox bridging.  I have discussed the risks, alternatives, benefits with regards to but not limited to the risk of reaction to medication, bleeding, infection, perforation and the patient is agreeable to proceed. Written consent to be obtained.  Update labs in the next couple weeks when she sees her PCP.  Plan for CBC, ferritin, iron/TIBC.

## 2018-03-25 ENCOUNTER — Ambulatory Visit: Payer: Medicaid Other | Attending: Nurse Practitioner | Admitting: Nurse Practitioner

## 2018-03-25 ENCOUNTER — Other Ambulatory Visit: Payer: Self-pay

## 2018-03-25 ENCOUNTER — Encounter: Payer: Self-pay | Admitting: Nurse Practitioner

## 2018-03-25 VITALS — BP 136/69 | HR 87 | Temp 98.4°F | Resp 18 | Ht 70.0 in | Wt 367.0 lb

## 2018-03-25 DIAGNOSIS — G894 Chronic pain syndrome: Secondary | ICD-10-CM | POA: Diagnosis present

## 2018-03-25 DIAGNOSIS — M5441 Lumbago with sciatica, right side: Secondary | ICD-10-CM | POA: Insufficient documentation

## 2018-03-25 DIAGNOSIS — G8929 Other chronic pain: Secondary | ICD-10-CM | POA: Insufficient documentation

## 2018-03-25 DIAGNOSIS — M5442 Lumbago with sciatica, left side: Secondary | ICD-10-CM | POA: Diagnosis present

## 2018-03-25 DIAGNOSIS — M25572 Pain in left ankle and joints of left foot: Secondary | ICD-10-CM

## 2018-03-25 DIAGNOSIS — M47816 Spondylosis without myelopathy or radiculopathy, lumbar region: Secondary | ICD-10-CM | POA: Insufficient documentation

## 2018-03-25 MED ORDER — OXYCODONE-ACETAMINOPHEN 10-325 MG PO TABS: 1.0000 | ORAL_TABLET | Freq: Two times a day (BID) | ORAL | 0 refills | Status: DC | PRN

## 2018-03-25 MED ORDER — DULOXETINE HCL 60 MG PO CPEP: 60.0000 mg | ORAL_CAPSULE | Freq: Every day | ORAL | 1 refills | Status: DC

## 2018-03-25 NOTE — Progress Notes (Signed)
Patient's Name: [name] [name]  MRN: [id number]  Referring Provider: Rosita Fire, MD  DOB: 10/01/1975  PCP: Rosita Fire, MD  DOS: 03/25/2018  Note by: Dionisio David, NP  Service setting: Ambulatory outpatient  Specialty: Interventional Pain Management  Location: [facility] (AMB) Pain Management Facility    Patient type: Established   HPI  Reason for Visit: [name] [name] is a 43 y.o. year old, female patient, who comes today with a chief complaint of Back Pain (lower) Last Appointment: Her last appointment at our practice was on 01/28/2018. I last saw her on 01/28/2018.  Pain Assessment: Today, [name] [name] describes the severity of the Chronic pain as a 8 /10. She indicates the location/referral of the pain to be Back  /to both legs just above ankles. Onset was: More than a month ago. The quality of pain is described as Throbbing(NEW:hands and fingers "go numb sometimes, like when doing daughter's hair"). Temporal description, or timing of pain is: Constant(Pt states pain intensity higher than it used to be, pt states more has been required of her physically recently in how she has had to care for various family members). Possible modifying factors: meds, heat. [name] [name]  height is 5' 10"  (1.778 m) and weight is 367 lb (166.5 kg) (abnormal). Her oral temperature is 98.4 F (36.9 C). Her blood pressure is 136/69 and her pulse is 87. Her respiration is 18 and oxygen saturation is 100%. She continues to have back pain.  Along with the new pain described above.  She is unable to complete physical therapy secondary to insurance.  She is not able to have surgery at this time secondary to having to care for her 42-year-old daughter.  She is not a candidate for injections because of her anticoagulant use; Coumadin.  She is not getting good pain control on her current dose. She has to use 3 tablets on some days.  She admits that she was taking the oxycodone/acetaminophen 10/325 4 times a day which was very effective.   She admits that this was reduced.  She would like to have treatment that would help with her pain but with her health history it is not that easy.  Controlled Substance Pharmacotherapy Assessment REMS (Risk Evaluation and Mitigation Strategy)  Analgesic:Oxycodone/acetaminophen 10-325 mg twice daily as needed MME/day:26m/day WRise Patience RN  03/25/2018  9:49 AM  Signed Nursing Pain Medication Assessment:  Safety precautions to be maintained throughout the outpatient stay will include: orient to surroundings, keep bed in low position, maintain call bell within reach at all times, provide assistance with transfer out of bed and ambulation.  Medication Inspection Compliance: Pill count conducted under aseptic conditions, in front of the patient. Neither the pills nor the bottle was removed from the patient's sight at any time. Once count was completed pills were immediately returned to the patient in their original bottle.  Medication: Oxycodone/APAP Pill/Patch Count: 0 of 60 pills remain Pill/Patch Appearance: Markings consistent with prescribed medication Bottle Appearance: Standard pharmacy container. Clearly labeled. Filled Date: 2 / 966/ 2020 Last Medication intake:  Yesterday   Pharmacokinetics: Liberation and absorption (onset of action): WNL Distribution (time to peak effect): WNL Metabolism and excretion (duration of action): WNL         Pharmacodynamics: Desired effects: Analgesia: Ms. SSalminenreports >50% benefit. Functional ability: Patient reports that medication allows her to accomplish basic ADLs Clinically meaningful improvement in function (CMIF): Sustained CMIF goals met Perceived effectiveness: Described as relatively effective, allowing for increase in  activities of daily living (ADL) Undesirable effects: Side-effects or Adverse reactions: None reported Monitoring: [name] [name] PMP: Online review of the past 47-monthperiod conducted. Compliant with practice rules and  regulations Last UDS on record: Summary  Date Value Ref Range Status  01/28/2018 FINAL  Final    Comment:    ==================================================================== TOXASSURE SELECT 13 (MW) ==================================================================== Test                             Result       Flag       Units Drug Absent but Declared for Prescription Verification   Oxycodone                      Not Detected UNEXPECTED ng/mg creat ==================================================================== Test                      Result    Flag   Units      Ref Range   Creatinine              88               mg/dL      >=20 ==================================================================== Declared Medications:  The flagging and interpretation on this report are based on the  following declared medications.  Unexpected results may arise from  inaccuracies in the declared medications.  **Note: The testing scope of this panel includes these medications:  Oxycodone (Percocet)  **Note: The testing scope of this panel does not include following  reported medications:  Acetaminophen (Percocet)  Amlodipine (Norvasc)  Duloxetine (Cymbalta)  Hydrochlorothiazide (Triamterene and HCTZ)  Lisinopril (Prinivil)  Lisinopril (Zestril)  Norethindrone  Triamterene (Triamterene and HCTZ)  Warfarin (Coumadin) ==================================================================== For clinical consultation, please call ([phone] ====================================================================    UDS interpretation: Compliant          Medication Assessment Form: Reviewed. Patient indicates being compliant with therapy Treatment compliance: Compliant Risk Assessment Profile: Aberrant behavior: See initial evaluations. None observed or detected today Comorbid factors increasing risk of overdose: See initial evaluation. No additional risks detected today Opioid risk  tool (ORT):  Opioid Risk  03/25/2018  Alcohol 0  Illegal Drugs 0  Rx Drugs 0  Alcohol 0  Illegal Drugs 0  Rx Drugs 0  Age between 16-45 years  0  History of Preadolescent Sexual Abuse 0  Psychological Disease 0  Depression 0  Opioid Risk Tool Scoring 0  Opioid Risk Interpretation Low Risk    ORT Scoring interpretation table:  Score <3 = Low Risk for SUD  Score between 4-7 = Moderate Risk for SUD  Score >8 = High Risk for Opioid Abuse   Risk of substance use disorder (SUD): Low-to-Moderate  Risk Mitigation Strategies:  Patient Counseling: Covered Patient-Prescriber Agreement (PPA): Present and active  Notification to other healthcare providers: Done  Pharmacologic Plan: No change in therapy, at this time.             ROS  Constitutional: Denies any fever or chills Gastrointestinal: No reported hemesis, hematochezia, vomiting, or acute GI distress Musculoskeletal: Denies any acute onset joint swelling, redness, loss of ROM, or weakness Neurological: No reported episodes of acute onset apraxia, aphasia, dysarthria, agnosia, amnesia, paralysis, loss of coordination, or loss of consciousness  Medication Review  DULoxetine, amLODipine, lisinopril, oxyCODONE-acetaminophen, triamterene-hydrochlorothiazide, and warfarin  History Review  Allergy: Ms. SJastrzebskiis allergic to sulfa antibiotics. Drug: [name] [name] reports no  history of drug use. Alcohol:  reports current alcohol use. Tobacco:  reports that she has been smoking cigarettes. She has a 4.50 pack-year smoking history. She has never used smokeless tobacco. Social: [name] [name]  reports that she has been smoking cigarettes. She has a 4.50 pack-year smoking history. She has never used smokeless tobacco. She reports current alcohol use. She reports that she does not use drugs. Medical:  has a past medical history of Abdominal cramps (08/30/2015), Anemia, Blood transfusion without reported diagnosis, Closed left ankle fracture, Clotting  disorder ([place]), DVT (deep venous thrombosis) ([place]), Fibroid tumor, Hidradenitis, High blood pressure, Hypertension, Obesity, OSA on CPAP, Oxygen deficiency, Pregnancy, Pulmonary embolism ([place]) (04/2013), Sleep apnea, and Tobacco use disorder. Surgical: [name] [name]  has a past surgical history that includes Cyst removal trunk; Abscess drainage (Bilateral, 01/10/15); and IVC FILTER PLACEMENT ([place] [facility]). Family: family history includes Cancer in her maternal grandmother; Diabetes in her paternal aunt and paternal uncle; Heart attack in her paternal grandmother; Hypertension in her mother; Other in her father. Problem List: [name] [name] has Chronic bilateral low back pain with bilateral sciatica; Chronic pain of left ankle; and Chronic pain syndrome on their pertinent problem list.  Lab Review  Kidney Function Lab Results  Component Value Date   BUN 17 12/29/2017   CREATININE 0.86 16/10/9602   BCR NOT APPLICABLE 54/09/8117   GFRAA >60 12/29/2017   GFRNONAA >60 12/29/2017  Liver Function Lab Results  Component Value Date   AST 14 (L) 12/29/2017   ALT 12 12/29/2017   ALBUMIN 3.8 12/29/2017  Note: Above Lab results reviewed.  Imaging Review  Note: Reviewed        Physical Exam  General appearance: Well nourished, well developed, and well hydrated. In no apparent acute distress Mental status: Alert, oriented x 3 (person, place, & time)       Respiratory: No evidence of acute respiratory distress Eyes: PERLA Vitals: BP 136/69   Pulse 87   Temp 98.4 F (36.9 C) (Oral)   Resp 18   Ht 5' 10"  (1.778 m)   Wt (!) 367 lb (166.5 kg)   LMP 03/17/2018 (Exact Date)   SpO2 100%   BMI 52.66 kg/m  BMI: Estimated body mass index is 52.66 kg/m as calculated from the following:   Height as of this encounter: 5' 10"  (1.778 m).   Weight as of this encounter: 367 lb (166.5 kg). Ideal: Ideal body weight: 68.5 kg (151 lb 0.2 oz) Adjusted ideal body weight: 107.7 kg (237 lb 6.5 oz)  Cervical Spine Area Exam   Skin & Axial Inspection: No masses, redness, edema, swelling, or associated skin lesions Alignment: Symmetrical Functional ROM: Adequate ROM      Stability: No instability detected Muscle Tone/Strength: Functionally intact. No obvious neuro-muscular anomalies detected. Sensory (Neurological): Unimpaired Palpation: No palpable anomalies             Upper Extremity (UE) Exam    Side: Right upper extremity  Side: Left upper extremity  Skin & Extremity Inspection: Skin color, temperature, and hair growth are WNL. No peripheral edema or cyanosis. No masses, redness, swelling, asymmetry, or associated skin lesions. No contractures.  Skin & Extremity Inspection: Skin color, temperature, and hair growth are WNL. No peripheral edema or cyanosis. No masses, redness, swelling, asymmetry, or associated skin lesions. No contractures.  Functional ROM: Adequate ROM          Functional ROM: Adequate ROM          Muscle Tone/Strength:  Functionally intact. No obvious neuro-muscular anomalies detected.  Muscle Tone/Strength: Functionally intact. No obvious neuro-muscular anomalies detected.  Sensory (Neurological): Movement-associated pain        Upper arm  Sensory (Neurological): Movement-associated pain          Palpation: No palpable anomalies              Palpation: No palpable anomalies              Provocative Test(s):  Phalen's test: (+) for CTS Tinel's test: deferred Apley's scratch test (touch opposite shoulder):  Action 1 (Across chest): Adequate ROM Action 2 (Overhead): Adequate ROM Action 3 (LB reach): Adequate ROM   Provocative Test(s):  Phalen's test: (-) Tinel's test: deferred Apley's scratch test (touch opposite shoulder):  Action 1 (Across chest): Adequate ROM Action 2 (Overhead): Adequate ROM Action 3 (LB reach): Adequate ROM    Lumbar Spine Area Exam  Skin & Axial Inspection: No masses, redness, or swelling Alignment: Symmetrical Functional ROM: Unrestricted ROM       Stability:  No instability detected Muscle Tone/Strength: Functionally intact. No obvious neuro-muscular anomalies detected. Sensory (Neurological): Unimpaired Palpation: Complains of area being tender to palpation       Provocative Tests: Hyperextension/rotation test: Positive bilaterally for facet joint pain. Lumbar quadrant test (Kemp's test): deferred today       Lateral bending test: deferred today       Patrick's Maneuver: deferred today                    *(Flexion, ABduction and External Rotation) Gait & Posture Assessment  Ambulation: Unassisted Gait: Antalgic Posture: WNL  Lower Extremity Exam    Side: Right lower extremity  Side: Left lower extremity  Stability: No instability observed          Stability: No instability observed          Skin & Extremity Inspection: Skin color, temperature, and hair growth are WNL. No peripheral edema or cyanosis. No masses, redness, swelling, asymmetry, or associated skin lesions. No contractures.  Skin & Extremity Inspection: Skin color, temperature, and hair growth are WNL. No peripheral edema or cyanosis. No masses, redness, swelling, asymmetry, or associated skin lesions. No contractures.  Functional ROM: Adequate ROM                  Functional ROM: Adequate ROM                  Muscle Tone/Strength: Functionally intact. No obvious neuro-muscular anomalies detected.  Muscle Tone/Strength: Functionally intact. No obvious neuro-muscular anomalies detected.  Sensory (Neurological): Unimpaired        Sensory (Neurological): Unimpaired        Palpation: No palpable anomalies  Palpation: No palpable anomalies   Assessment   Status Diagnosis  Worsening Persistent Controlled 1. Lumbar spondylosis   2. Chronic bilateral low back pain with bilateral sciatica   3. Chronic pain of left ankle   4. Chronic pain syndrome      Updated Problems: No problems updated.  Plan of Care  Pharmacotherapy (Medications Ordered): Meds ordered this encounter   Medications  . oxyCODONE-acetaminophen (PERCOCET) 10-325 MG tablet    Sig: Take 1 tablet by mouth 2 (two) times daily as needed for up to 30 days for pain.    Dispense:  60 tablet    Refill:  0    Do not place this medication, or any other prescription from our practice, on "Automatic Refill". Patient may  have prescription filled one day early if pharmacy is closed on scheduled refill date.    Order Specific Question:   Supervising Provider    Answer:   Gillis Santa [[id number]]  . DULoxetine (CYMBALTA) 60 MG capsule    Sig: Take 1 capsule (60 mg total) by mouth daily.    Dispense:  30 capsule    Refill:  1    Order Specific Question:   Supervising Provider    Answer:   Gillis Santa [[id number]]  . oxyCODONE-acetaminophen (PERCOCET) 10-325 MG tablet    Sig: Take 1 tablet by mouth 2 (two) times daily as needed for up to 30 days for pain.    Dispense:  60 tablet    Refill:  0    Do not place this medication, or any other prescription from our practice, on "Automatic Refill". Patient may have prescription filled one day early if pharmacy is closed on scheduled refill date.    Order Specific Question:   Supervising Provider    Answer:   Gillis Santa [[id number]]   Administered today: Carol Ada Kirley had no medications administered during this visit.  Orders:  No orders of the defined types were placed in this encounter.   Follow-up plan:   Return in about 2 months (around 05/25/2018) for MedMgmt..    Note by: Dionisio David, NP Date: 03/25/2018; Time: 3:43 PM

## 2018-03-25 NOTE — Patient Instructions (Addendum)
____________________________________________________________________________________________  Medication Rules  Purpose: To inform patients, and their family members, of our rules and regulations.  Applies to: All patients receiving prescriptions (written or electronic).  Pharmacy of record: Pharmacy where electronic prescriptions will be sent. If written prescriptions are taken to a different pharmacy, please inform the nursing staff. The pharmacy listed in the electronic medical record should be the one where you would like electronic prescriptions to be sent.  Electronic prescriptions: In compliance with the [place] Strengthen Opioid Misuse Prevention (STOP) Act of 2017 (Session Law 2017-74/H243), effective January 21, 2018, all controlled substances must be electronically prescribed. Calling prescriptions to the pharmacy will cease to exist.  Prescription refills: Only during scheduled appointments. Applies to all prescriptions.  NOTE: The following applies primarily to controlled substances (Opioid* Pain Medications).   Patient's responsibilities: 1. Pain Pills: Bring all pain pills to every appointment (except for procedure appointments). 2. Pill Bottles: Bring pills in original pharmacy bottle. Always bring the newest bottle. Bring bottle, even if empty. 3. Medication refills: You are responsible for knowing and keeping track of what medications you take and those you need refilled. The day before your appointment: write a list of all prescriptions that need to be refilled. The day of the appointment: give the list to the admitting nurse. Prescriptions will be written only during appointments. No prescriptions will be written on procedure days. If you forget a medication: it will not be "Called in", "Faxed", or "electronically sent". You will need to get another appointment to get these prescribed. No early refills. Do not call asking to have your prescription filled  early. 4. Prescription Accuracy: You are responsible for carefully inspecting your prescriptions before leaving our office. Have the discharge nurse carefully go over each prescription with you, before taking them home. Make sure that your name is accurately spelled, that your address is correct. Check the name and dose of your medication to make sure it is accurate. Check the number of pills, and the written instructions to make sure they are clear and accurate. Make sure that you are given enough medication to last until your next medication refill appointment. 5. Taking Medication: Take medication as prescribed. When it comes to controlled substances, taking less pills or less frequently than prescribed is permitted and encouraged. Never take more pills than instructed. Never take medication more frequently than prescribed.  6. Inform other Doctors: Always inform, all of your healthcare providers, of all the medications you take. 7. Pain Medication from other Providers: You are not allowed to accept any additional pain medication from any other Doctor or Healthcare provider. There are two exceptions to this rule. (see below) In the event that you require additional pain medication, you are responsible for notifying us, as stated below. 8. Medication Agreement: You are responsible for carefully reading and following our Medication Agreement. This must be signed before receiving any prescriptions from our practice. Safely store a copy of your signed Agreement. Violations to the Agreement will result in no further prescriptions. (Additional copies of our Medication Agreement are available upon request.) 9. Laws, Rules, & Regulations: All patients are expected to follow all Federal and State Laws, Statutes, Rules, & Regulations. Ignorance of the Laws does not constitute a valid excuse. The use of any illegal substances is prohibited. 10. Adopted CDC guidelines & recommendations: Target dosing levels will be  at or below 60 MME/day. Use of benzodiazepines** is not recommended.  Exceptions: There are only two exceptions to the rule of not   receiving pain medications from other Healthcare Providers. 1. Exception #1 (Emergencies): In the event of an emergency (i.e.: accident requiring emergency care), you are allowed to receive additional pain medication. However, you are responsible for: As soon as you are able, call our office [phone], at any time of the day or night, and leave a message stating your name, the date and nature of the emergency, and the name and dose of the medication prescribed. In the event that your call is answered by a member of our staff, make sure to document and save the date, time, and the name of the person that took your information.  2. Exception #2 (Planned Surgery): In the event that you are scheduled by another doctor or dentist to have any type of surgery or procedure, you are allowed (for a period no longer than 30 days), to receive additional pain medication, for the acute post-op pain. However, in this case, you are responsible for picking up a copy of our "Post-op Pain Management for Surgeons" handout, and giving it to your surgeon or dentist. This document is available at our office, and does not require an appointment to obtain it. Simply go to our office during business hours (Monday-Thursday from 8:00 AM to 4:00 PM) (Friday 8:00 AM to 12:00 Noon) or if you have a scheduled appointment with [place], prior to your surgery, and ask for it by name. In addition, you will need to provide [place] with your name, name of your surgeon, type of surgery, and date of procedure or surgery.  *Opioid medications include: morphine, codeine, oxycodone, oxymorphone, hydrocodone, hydromorphone, meperidine, tramadol, tapentadol, buprenorphine, fentanyl, methadone. **Benzodiazepine medications include: diazepam (Valium), alprazolam (Xanax), clonazepam (Klonopine), lorazepam (Ativan), clorazepate  (Tranxene), chlordiazepoxide (Librium), estazolam (Prosom), oxazepam (Serax), temazepam (Restoril), triazolam (Halcion) (Last updated: 03/20/2017) ____________________________________________________________________________________________ Oxycodone with acetaminophen to last until 05/30/2018 and cymbalta with 1 refill has been escribed to your pharmacy.

## 2018-03-25 NOTE — Telephone Encounter (Addendum)
[name] [name], patient is aware that the Coumadin clinic will provide all instructions for stopping coumadin and Lovenox for the colonoscopy.

## 2018-03-25 NOTE — Progress Notes (Signed)
Nursing Pain Medication Assessment:  Safety precautions to be maintained throughout the outpatient stay will include: orient to surroundings, keep bed in low position, maintain call bell within reach at all times, provide assistance with transfer out of bed and ambulation.  Medication Inspection Compliance: Pill count conducted under aseptic conditions, in front of the patient. Neither the pills nor the bottle was removed from the patient's sight at any time. Once count was completed pills were immediately returned to the patient in their original bottle.  Medication: Oxycodone/APAP Pill/Patch Count: 0 of 60 pills remain Pill/Patch Appearance: Markings consistent with prescribed medication Bottle Appearance: Standard pharmacy container. Clearly labeled. Filled Date: 2 / 85 / 2020 Last Medication intake:  Yesterday

## 2018-03-25 NOTE — Telephone Encounter (Signed)
Noted, thanks!

## 2018-03-30 ENCOUNTER — Telehealth: Payer: Self-pay | Admitting: *Deleted

## 2018-03-30 ENCOUNTER — Encounter: Payer: Self-pay | Admitting: Nurse Practitioner

## 2018-03-30 ENCOUNTER — Other Ambulatory Visit: Payer: Self-pay | Admitting: Nurse Practitioner

## 2018-03-30 MED ORDER — OXYCODONE-ACETAMINOPHEN 10-325 MG PO TABS: 1.0000 | ORAL_TABLET | Freq: Three times a day (TID) | ORAL | 0 refills | Status: DC | PRN

## 2018-03-30 NOTE — Telephone Encounter (Signed)
-----   Message from Vevelyn Francois, NP sent at 03/30/2018  1:15 PM EDT ----- Regarding: FW: Treatment plan Please call the RX and have them to void the Percocet 10/325 mg BID and make them aware that I sent in a new RX for Percocet 10/325 mg Every #8 hours ----- Message ----- From: Gillis Santa, MD Sent: 03/30/2018  11:51 AM EDT To: Vevelyn Francois, NP Subject: RE: Treatment plan                             Yes, ok to increase to TID at current dose (q8 hrs prn)  Thanks for checking Crystal.  ----- Message ----- From: Vevelyn Francois, NP Sent: 03/25/2018   3:39 PM EDT To: Gillis Santa, MD Subject: Treatment plan                                 Hi Dr. Holley Raring She is having increased pain and because of her history of pulmonary embolism and DVT, she is not a candidate for surgery or interventional therapy. She is willing to do Lovenox injections however this has been declined by the providers. Medicaid for her insurance physical therapy is not an option.  She is currently on Percocet 10/325 twice daily.  Is there any possibility that this can be adjusted

## 2018-03-30 NOTE — Telephone Encounter (Signed)
Contacted [facility], instructed to void [employer] for bid, because a new script for tid has been sent.

## 2018-04-07 ENCOUNTER — Ambulatory Visit (HOSPITAL_COMMUNITY)
Admission: RE | Admit: 2018-04-07 | Discharge: 2018-04-07 | Disposition: A | Discharge status: Home / Self Care | Payer: Medicaid Other | Source: Ambulatory Visit | Attending: Internal Medicine | Admitting: Internal Medicine

## 2018-04-07 ENCOUNTER — Other Ambulatory Visit: Payer: Self-pay

## 2018-04-07 DIAGNOSIS — R928 Other abnormal and inconclusive findings on diagnostic imaging of breast: Secondary | ICD-10-CM

## 2018-04-07 MED ORDER — LIDOCAINE-EPINEPHRINE (PF) 1 %-1:200000 IJ SOLN
INTRAMUSCULAR | Status: AC
  Filled 2018-04-07: qty 30

## 2018-04-07 MED ORDER — LIDOCAINE HCL (PF) 2 % IJ SOLN
INTRAMUSCULAR | Status: AC
  Filled 2018-04-07: qty 10

## 2018-04-10 ENCOUNTER — Telehealth: Payer: Self-pay | Admitting: Gastroenterology

## 2018-04-10 NOTE — Telephone Encounter (Signed)
Patient is supposed to have labs with PCP this coming week. Please request results as available.

## 2018-04-13 ENCOUNTER — Encounter: Payer: Self-pay | Admitting: Gastroenterology

## 2018-04-14 ENCOUNTER — Encounter (HOSPITAL_COMMUNITY): Payer: Self-pay

## 2018-04-14 ENCOUNTER — Ambulatory Visit (HOSPITAL_COMMUNITY)
Admission: RE | Admit: 2018-04-14 | Discharge: 2018-04-14 | Disposition: A | Discharge status: Home / Self Care | Payer: Medicaid Other | Source: Ambulatory Visit | Attending: Internal Medicine | Admitting: Internal Medicine

## 2018-04-14 ENCOUNTER — Other Ambulatory Visit (HOSPITAL_COMMUNITY): Payer: Self-pay | Admitting: Internal Medicine

## 2018-04-14 ENCOUNTER — Other Ambulatory Visit (HOSPITAL_COMMUNITY): Payer: Self-pay

## 2018-04-14 ENCOUNTER — Other Ambulatory Visit: Payer: Self-pay

## 2018-04-14 DIAGNOSIS — R928 Other abnormal and inconclusive findings on diagnostic imaging of breast: Secondary | ICD-10-CM

## 2018-04-14 MED ORDER — LIDOCAINE-EPINEPHRINE (PF) 1 %-1:200000 IJ SOLN
INTRAMUSCULAR | Status: AC
  Administered 2018-04-14: 5 mL
  Filled 2018-04-14: qty 30

## 2018-04-14 MED ORDER — LIDOCAINE HCL (PF) 1 % IJ SOLN
INTRAMUSCULAR | Status: AC
  Administered 2018-04-14: 4 mL
  Filled 2018-04-14: qty 5

## 2018-04-17 NOTE — Telephone Encounter (Signed)
Requested labs

## 2018-04-21 ENCOUNTER — Telehealth: Payer: Self-pay | Admitting: *Deleted

## 2018-04-27 ENCOUNTER — Telehealth: Payer: Self-pay | Admitting: Cardiology

## 2018-04-27 NOTE — Telephone Encounter (Signed)
° ° ° °  COVID-19 Pre-Screening Questions: ° °• Do you currently have a fever? No °•  °• Have you recently travelled on a cruise, internationally, or to [place], [place], [place], [place], [place], or [place], [place] ([place]) ? No °•  °• Have you been in contact with someone that is currently pending confirmation of Covid19 testing or has been confirmed to have the Covid19 virus? No °•  °• Are you currently experiencing fatigue or cough? No  ° ° °   ° ° ° ° ° °

## 2018-04-28 ENCOUNTER — Telehealth: Payer: Self-pay | Admitting: Gastroenterology

## 2018-04-28 NOTE — Telephone Encounter (Signed)
Called pt, TCS w/Propofol w/RMR rescheduled to 06/08/18 at 12:30pm. LMOVM for endo scheduler.

## 2018-04-28 NOTE — Telephone Encounter (Signed)
Pre-op appt rescheduled to 06/04/18 at 9:00am. Letter mailed with new procedure instructions.  [name] [name], her Colonoscopy has been rescheduled to 06/08/18.

## 2018-04-28 NOTE — Telephone Encounter (Signed)
Labs done by PCP dated April 13, 2018: Albumin 4, total bilirubin 0.3, alkaline phosphatase 58, AST 15, ALT 9, white blood cell count 6900, hemoglobin 12.6, hematocrit 38.5, platelets 319,000, BUN 21, creatinine 0.95  Patient is currently scheduled for colonoscopy on April 20 for iron deficiency anemia.  Labs a few weeks ago with normal hemoglobin.  Please plan to reschedule patient as she would not be considered urgent in the setting of this COVID 19 crisis.   Please copy Edrick Oh with new date as patient will require Lovenox bridge.

## 2018-05-04 ENCOUNTER — Telehealth: Payer: Self-pay | Admitting: *Deleted

## 2018-05-04 NOTE — Telephone Encounter (Signed)
° °  COVID-19 Pre-Screening Questions: ° °• Do you currently have a fever?NO ° ° °• Have you recently travelled on a cruise, internationally, or to [place], [place], [place], [place], [place], or [place], [place] ([place]) ? NO °•  °• Have you been in contact with someone that is currently pending confirmation of Covid19 testing or has been confirmed to have the Covid19 virus?  NO °•  °Are you currently experiencing fatigue or cough? NO ° ° °   ° ° ° ° °

## 2018-05-05 ENCOUNTER — Ambulatory Visit (INDEPENDENT_AMBULATORY_CARE_PROVIDER_SITE_OTHER): Payer: Medicaid Other | Admitting: *Deleted

## 2018-05-05 ENCOUNTER — Inpatient Hospital Stay (HOSPITAL_COMMUNITY): Admission: RE | Admit: 2018-05-05 | Payer: Self-pay | Source: Ambulatory Visit

## 2018-05-05 ENCOUNTER — Other Ambulatory Visit: Payer: Self-pay

## 2018-05-05 DIAGNOSIS — Z86711 Personal history of pulmonary embolism: Secondary | ICD-10-CM | POA: Diagnosis not present

## 2018-05-05 DIAGNOSIS — Z5181 Encounter for therapeutic drug level monitoring: Secondary | ICD-10-CM | POA: Diagnosis not present

## 2018-05-05 LAB — POCT INR: INR: 4 — AB (ref 2.0–3.0)

## 2018-05-05 NOTE — Patient Instructions (Signed)
Hold coumadin tonight then decrease dose to 10mg  daily except 15mg  on Tuesdays  Recheck 06/01/18.  Lovenox bridge for colonoscopy on 06/08/18

## 2018-05-06 NOTE — Telephone Encounter (Signed)
Patient seen in coumadin clinic yesterday. Edrick Oh aware of new procedure date.

## 2018-05-15 ENCOUNTER — Other Ambulatory Visit: Payer: Self-pay | Admitting: Nurse Practitioner

## 2018-05-15 ENCOUNTER — Other Ambulatory Visit: Payer: Self-pay | Admitting: Cardiology

## 2018-05-15 DIAGNOSIS — I2602 Saddle embolus of pulmonary artery with acute cor pulmonale: Secondary | ICD-10-CM

## 2018-05-25 ENCOUNTER — Ambulatory Visit: Payer: Medicaid Other | Admitting: Nurse Practitioner

## 2018-05-25 ENCOUNTER — Other Ambulatory Visit: Payer: Self-pay

## 2018-05-26 ENCOUNTER — Other Ambulatory Visit: Payer: Self-pay

## 2018-05-26 ENCOUNTER — Ambulatory Visit: Payer: Medicaid Other | Attending: Nurse Practitioner | Admitting: Nurse Practitioner

## 2018-05-26 DIAGNOSIS — G8929 Other chronic pain: Secondary | ICD-10-CM

## 2018-05-26 DIAGNOSIS — M5442 Lumbago with sciatica, left side: Secondary | ICD-10-CM | POA: Diagnosis not present

## 2018-05-26 DIAGNOSIS — G894 Chronic pain syndrome: Secondary | ICD-10-CM

## 2018-05-26 DIAGNOSIS — M5136 Other intervertebral disc degeneration, lumbar region: Secondary | ICD-10-CM | POA: Diagnosis not present

## 2018-05-26 DIAGNOSIS — M47816 Spondylosis without myelopathy or radiculopathy, lumbar region: Secondary | ICD-10-CM | POA: Diagnosis not present

## 2018-05-26 DIAGNOSIS — M5441 Lumbago with sciatica, right side: Secondary | ICD-10-CM

## 2018-05-26 MED ORDER — DULOXETINE HCL 60 MG PO CPEP: 60.0000 mg | ORAL_CAPSULE | Freq: Every day | ORAL | 1 refills | Status: DC

## 2018-05-26 MED ORDER — OXYCODONE-ACETAMINOPHEN 10-325 MG PO TABS: 1.0000 | ORAL_TABLET | Freq: Three times a day (TID) | ORAL | 0 refills | Status: DC | PRN

## 2018-05-26 MED ORDER — OXYCODONE-ACETAMINOPHEN 10-325 MG PO TABS: 1.0000 | ORAL_TABLET | Freq: Three times a day (TID) | ORAL | 0 refills | Status: AC | PRN

## 2018-05-26 NOTE — Progress Notes (Signed)
Pain Management Encounter Note - Virtual Visit via Telephone Telehealth (real-time audio visits between healthcare provider and patient).  Patient's Phone No. & Preferred Pharmacy:  [phone] (home); [phone] (mobile); (Preferred) [facility], [address] [place] [address] Phone: [phone] Fax: [phone]  [facility] [address], [place] - [place] [address] [place] [address] Phone: [phone] Fax: [phone]   Pre-screening note:  Our staff contacted [name] [name] and offered her an "in person", "face-to-face" appointment versus a telephone encounter. She indicated preferring the telephone encounter, at this time.  Reason for Virtual Visit: COVID-19*  Social distancing based on CDC and AMA recommendations.   I contacted [name] [name] on 05/26/2018 at 9:45 AM by telephone and clearly identified myself as Dionisio David, NP. I verified that I was speaking with the correct person using two identifiers (Name and date of birth: Jul 09, 1975).  Advanced Informed Consent I sought verbal advanced consent from [name] [name] for telemedicine interactions and virtual visit. I informed [name] [name] of the security and privacy concerns, risks, and limitations associated with performing an evaluation and management service by telephone. I also informed [name] [name] of the availability of "in person" appointments and I informed her of the possibility of a patient responsible charge related to this service. [name] [name] expressed understanding and agreed to proceed.   Historic Elements   [name] [name] is a 43 y.o. year old, female patient evaluated today after her last encounter by our practice on 05/15/2018. [name] [name]  has a past medical history of Abdominal cramps (08/30/2015), Anemia, Blood transfusion without reported diagnosis, Closed left ankle fracture, Clotting disorder ([place]), DVT (deep venous  thrombosis) ([place]), Fibroid tumor, Hidradenitis, High blood pressure, Hypertension, Obesity, OSA on CPAP, Oxygen deficiency, Pregnancy, Pulmonary embolism ([place]) (04/2013), Sleep apnea, and Tobacco use disorder. She also  has a past surgical history that includes Cyst removal trunk; Abscess drainage (Bilateral, 01/10/15); and IVC FILTER PLACEMENT ([facility]). [name] [name] has a current medication list which includes the following prescription(s): amlodipine, duloxetine, lisinopril, oxycodone-acetaminophen, oxycodone-acetaminophen, triamterene-hydrochlorothiazide, warfarin, and warfarin, and the following Facility-Administered Medications: sodium chloride. She  reports that she has been smoking cigarettes. She has a 4.50 pack-year smoking history. She has never used smokeless tobacco. She reports current alcohol use. She reports that she does not use drugs. [name] [name] is allergic to sulfa antibiotics.   HPI  I last saw her on 05/15/2018. She is being evaluated for medication management. She has 7/10 right sided lower back pain. She does have bilateral leg pain. She continues to have numbness and weakness in her legs. She admits that this is about the same. She continues to complain of numbness in her finger tips.   Pharmacotherapy Assessment  Analgesic:Oxycodone/acetaminophen 10-325 mg twice daily as needed MME/day:34m/day  Monitoring: Pharmacotherapy: No side-effects or adverse reactions reported. [place] PMP: PDMP not reviewed this encounter.       Compliance: No problems identified. Plan: Refer to "POC".  Review of recent tests  [place] BREAST BX W LOC DEV 1ST LESION IMG BX SPEC [place] Addendum: ADDENDUM REPORT: 04/15/2018 14:21   ADDENDUM:  Pathology of the LEFT breast biopsy revealed PSEUDOANGIOMATOUS  STROMAL HYPERPLASIA ([place]. FIBROCYSTIC CHANGES. THERE IS NO  EVIDENCE OF MALIGNANCY.   This was found to be concordant by Dr. WJimmye Norman   Recommendation: Six month follow-up diagnostic mammogram  and  possible ultrasound LEFT breast.   At  the patient's request, results and recommendations were relayed  to the patient by phone by Jetta Lout, [place] on 04/15/2018. The  patient stated she did well following the biopsy with no bleeding or  pain. Post biopsy instructions were reviewed with the patient and  all of her questions were answered. She was encouraged to contact  the imaging department of [facility] with any further  questions or concerns.   Addendum by Jetta Lout, RRA on 04/15/2018.   Electronically Signed    By: Dorise Bullion III M.D    On: 04/15/2018 14:21 Narrative: CLINICAL DATA:  Biopsy of left breast  EXAM: ULTRASOUND GUIDED LEFT BREAST CORE NEEDLE BIOPSY  COMPARISON:  Previous exam(s).  FINDINGS: I met with the patient and we discussed the procedure of ultrasound-guided biopsy, including benefits and alternatives. We discussed the high likelihood of a successful procedure. We discussed the risks of the procedure, including infection, bleeding, tissue injury, clip migration, and inadequate sampling. Informed written consent was given. The usual time-out protocol was performed immediately prior to the procedure.  Lesion quadrant: Lower  Using sterile technique and 1% Lidocaine as local anesthetic, under direct ultrasound visualization, a 12 gauge spring-loaded device was used to perform biopsy of a left breast mass at 3:30 using a lateral approach. At the conclusion of the procedure a tissue marker clip was deployed into the biopsy cavity. Follow up 2 view mammogram was performed and dictated separately.  IMPRESSION: Ultrasound guided biopsy of a left breast mass 330. No apparent complications.  Electronically Signed: By: Dorise Bullion III M.D On: 04/14/2018 08:52   Anti-coag visit on 05/05/2018  Component Date Value Ref Range Status  . INR 05/05/2018 4.0* 2.0 - 3.0 Final   Assessment  The primary encounter diagnosis was Lumbar  spondylosis. Diagnoses of Chronic bilateral low back pain with bilateral sciatica, Lumbar degenerative disc disease, Morbid obesity ([place]), and Chronic pain syndrome were also pertinent to this visit.  Plan of Care  I am having [name] [name] maintain her amLODipine, lisinopril, triamterene-hydrochlorothiazide, warfarin, warfarin, oxyCODONE-acetaminophen, oxyCODONE-acetaminophen, and DULoxetine.  Pharmacotherapy (Medications Ordered): Meds ordered this encounter  Medications  . oxyCODONE-acetaminophen (PERCOCET) 10-325 MG tablet    Sig: Take 1 tablet by mouth every 8 (eight) hours as needed for up to 30 days for pain.    Dispense:  90 tablet    Refill:  0    Do not place this medication, or any other prescription from our practice, on "Automatic Refill". Patient may have prescription filled one day early if pharmacy is closed on scheduled refill date.    Order Specific Question:   Supervising Provider    Answer:   Milinda Pointer [phone]  . oxyCODONE-acetaminophen (PERCOCET) 10-325 MG tablet    Sig: Take 1 tablet by mouth every 8 (eight) hours as needed for up to 30 days for pain.    Dispense:  90 tablet    Refill:  0    Do not place this medication, or any other prescription from our practice, on "Automatic Refill". Patient may have prescription filled one day early if pharmacy is closed on scheduled refill date.    Order Specific Question:   Supervising Provider    Answer:   Milinda Pointer [phone]  . DULoxetine (CYMBALTA) 60 MG capsule    Sig: Take 1 capsule (60 mg total) by mouth daily.    Dispense:  30 capsule    Refill:  1    Order Specific Question:   Supervising Provider  AnswerMilinda Pointer [[id number]]   Orders:  No orders of the defined types were placed in this encounter.  Follow-up plan:   Return in about 2 months (around 07/26/2018) for MedMgmt.   I discussed the assessment and treatment plan with the patient. The patient was provided an opportunity to  ask questions and all were answered. The patient agreed with the plan and demonstrated an understanding of the instructions.  Patient advised to call back or seek an in-person evaluation if the symptoms or condition worsens.  Total duration of non-face-to-face encounter: 12 minutes.  Note by: Dionisio David, NP Date: 05/26/2018; Time: 9:58 AM  Disclaimer:  * Given the special circumstances of the COVID-19 pandemic, the federal government has announced that the Office for Civil Rights (OCR) will exercise its enforcement discretion and will not impose penalties on physicians using telehealth in the event of noncompliance with regulatory requirements under the [place] (HIPAA) in connection with the good faith provision of telehealth during the RRNHA-57 national public health emergency. ([place])

## 2018-05-26 NOTE — Patient Instructions (Signed)
____________________________________________________________________________________________  Medication Rules  Purpose: To inform patients, and their family members, of our rules and regulations.  Applies to: All patients receiving prescriptions (written or electronic).  Pharmacy of record: Pharmacy where electronic prescriptions will be sent. If written prescriptions are taken to a different pharmacy, please inform the nursing staff. The pharmacy listed in the electronic medical record should be the one where you would like electronic prescriptions to be sent.  Electronic prescriptions: In compliance with the [place] Strengthen Opioid Misuse Prevention (STOP) Act of 2017 (Session Law 2017-74/H243), effective January 21, 2018, all controlled substances must be electronically prescribed. Calling prescriptions to the pharmacy will cease to exist.  Prescription refills: Only during scheduled appointments. Applies to all prescriptions.  NOTE: The following applies primarily to controlled substances (Opioid* Pain Medications).   Patient's responsibilities: 1. Pain Pills: Bring all pain pills to every appointment (except for procedure appointments). 2. Pill Bottles: Bring pills in original pharmacy bottle. Always bring the newest bottle. Bring bottle, even if empty. 3. Medication refills: You are responsible for knowing and keeping track of what medications you take and those you need refilled. The day before your appointment: write a list of all prescriptions that need to be refilled. The day of the appointment: give the list to the admitting nurse. Prescriptions will be written only during appointments. No prescriptions will be written on procedure days. If you forget a medication: it will not be "Called in", "Faxed", or "electronically sent". You will need to get another appointment to get these prescribed. No early refills. Do not call asking to have your prescription filled  early. 4. Prescription Accuracy: You are responsible for carefully inspecting your prescriptions before leaving our office. Have the discharge nurse carefully go over each prescription with you, before taking them home. Make sure that your name is accurately spelled, that your address is correct. Check the name and dose of your medication to make sure it is accurate. Check the number of pills, and the written instructions to make sure they are clear and accurate. Make sure that you are given enough medication to last until your next medication refill appointment. 5. Taking Medication: Take medication as prescribed. When it comes to controlled substances, taking less pills or less frequently than prescribed is permitted and encouraged. Never take more pills than instructed. Never take medication more frequently than prescribed.  6. Inform other Doctors: Always inform, all of your healthcare providers, of all the medications you take. 7. Pain Medication from other Providers: You are not allowed to accept any additional pain medication from any other Doctor or Healthcare provider. There are two exceptions to this rule. (see below) In the event that you require additional pain medication, you are responsible for notifying us, as stated below. 8. Medication Agreement: You are responsible for carefully reading and following our Medication Agreement. This must be signed before receiving any prescriptions from our practice. Safely store a copy of your signed Agreement. Violations to the Agreement will result in no further prescriptions. (Additional copies of our Medication Agreement are available upon request.) 9. Laws, Rules, & Regulations: All patients are expected to follow all Federal and State Laws, Statutes, Rules, & Regulations. Ignorance of the Laws does not constitute a valid excuse. The use of any illegal substances is prohibited. 10. Adopted CDC guidelines & recommendations: Target dosing levels will be  at or below 60 MME/day. Use of benzodiazepines** is not recommended.  Exceptions: There are only two exceptions to the rule of not   receiving pain medications from other Healthcare Providers. 1. Exception #1 (Emergencies): In the event of an emergency (i.e.: accident requiring emergency care), you are allowed to receive additional pain medication. However, you are responsible for: As soon as you are able, call our office [phone], at any time of the day or night, and leave a message stating your name, the date and nature of the emergency, and the name and dose of the medication prescribed. In the event that your call is answered by a member of our staff, make sure to document and save the date, time, and the name of the person that took your information.  2. Exception #2 (Planned Surgery): In the event that you are scheduled by another doctor or dentist to have any type of surgery or procedure, you are allowed (for a period no longer than 30 days), to receive additional pain medication, for the acute post-op pain. However, in this case, you are responsible for picking up a copy of our "Post-op Pain Management for Surgeons" handout, and giving it to your surgeon or dentist. This document is available at our office, and does not require an appointment to obtain it. Simply go to our office during business hours (Monday-Thursday from 8:00 AM to 4:00 PM) (Friday 8:00 AM to 12:00 Noon) or if you have a scheduled appointment with us, prior to your surgery, and ask for it by name. In addition, you will need to provide us with your name, name of your surgeon, type of surgery, and date of procedure or surgery.  *Opioid medications include: morphine, codeine, oxycodone, oxymorphone, hydrocodone, hydromorphone, meperidine, tramadol, tapentadol, buprenorphine, fentanyl, methadone. **Benzodiazepine medications include: diazepam (Valium), alprazolam (Xanax), clonazepam (Klonopine), lorazepam (Ativan), clorazepate  (Tranxene), chlordiazepoxide (Librium), estazolam (Prosom), oxazepam (Serax), temazepam (Restoril), triazolam (Halcion) (Last updated: 03/20/2017) ____________________________________________________________________________________________    

## 2018-05-28 NOTE — Telephone Encounter (Signed)
error 

## 2018-06-01 ENCOUNTER — Other Ambulatory Visit: Payer: Self-pay

## 2018-06-01 ENCOUNTER — Ambulatory Visit (INDEPENDENT_AMBULATORY_CARE_PROVIDER_SITE_OTHER): Payer: Medicaid Other | Admitting: *Deleted

## 2018-06-01 DIAGNOSIS — Z5181 Encounter for therapeutic drug level monitoring: Secondary | ICD-10-CM | POA: Diagnosis not present

## 2018-06-01 DIAGNOSIS — Z86711 Personal history of pulmonary embolism: Secondary | ICD-10-CM

## 2018-06-01 LAB — POCT INR: INR: 1.6 — AB (ref 2.0–3.0)

## 2018-06-01 MED ORDER — ENOXAPARIN SODIUM 150 MG/ML ~~LOC~~ SOLN: 150.0000 mg | Freq: Two times a day (BID) | SUBCUTANEOUS | 0 refills | Status: DC

## 2018-06-01 NOTE — Patient Instructions (Signed)
[name] [name]  06/01/2018     @PREFPERIOPPHARMACY @   Your procedure is scheduled on 06/08/2018.  Report to [facility] at  815   A.M.  Call this number if you have problems the morning of surgery:  [phone]   Remember:  Follow the diet and prep instructions given to you by Dr Roseanne Kaufman office.                     Take these medicines the morning of surgery with A SIP OF WATER  Amlodipine, duloxetine, oxycodone(if needed).    Do not wear jewelry, make-up or nail polish.  Do not wear lotions, powders, or perfumes, or deodorant.  Do not shave 48 hours prior to surgery.  Men may shave face and neck.  Do not bring valuables to the hospital.  [facility] is not responsible for any belongings or valuables.  Contacts, dentures or bridgework may not be worn into surgery.  Leave your suitcase in the car.  After surgery it may be brought to your room.  For patients admitted to the hospital, discharge time will be determined by your treatment team.  Patients discharged the day of surgery will not be allowed to drive home.   Name and phone number of your driver:   family Special instructions:  None  Please read over the following fact sheets that you were given. Anesthesia Post-op Instructions and Care and Recovery After Surgery       Colonoscopy, Adult, Care After This sheet gives you information about how to care for yourself after your procedure. Your health care provider may also give you more specific instructions. If you have problems or questions, contact your health care provider. What can I expect after the procedure? After the procedure, it is common to have:  A small amount of blood in your stool for 24 hours after the procedure.  Some gas.  Mild abdominal cramping or bloating. Follow these instructions at home: General instructions  For the first 24 hours after the procedure: ? Do not drive or use machinery. ? Do not sign important documents. ? Do not  drink alcohol. ? Do your regular daily activities at a slower pace than normal. ? Eat soft, easy-to-digest foods.  Take over-the-counter or prescription medicines only as told by your health care provider. Relieving cramping and bloating   Try walking around when you have cramps or feel bloated.  Apply heat to your abdomen as told by your health care provider. Use a heat source that your health care provider recommends, such as a moist heat pack or a heating pad. ? Place a towel between your skin and the heat source. ? Leave the heat on for 20-30 minutes. ? Remove the heat if your skin turns bright red. This is especially important if you are unable to feel pain, heat, or cold. You may have a greater risk of getting burned. Eating and drinking   Drink enough fluid to keep your urine pale yellow.  Resume your normal diet as instructed by your health care provider. Avoid heavy or fried foods that are hard to digest.  Avoid drinking alcohol for as long as instructed by your health care provider. Contact a health care provider if:  You have blood in your stool 2-3 days after the procedure. Get help right away if:  You have more than a small spotting of blood in your stool.  You pass large blood clots in your  stool.  Your abdomen is swollen.  You have nausea or vomiting.  You have a fever.  You have increasing abdominal pain that is not relieved with medicine. Summary  After the procedure, it is common to have a small amount of blood in your stool. You may also have mild abdominal cramping and bloating.  For the first 24 hours after the procedure, do not drive or use machinery, sign important documents, or drink alcohol.  Contact your health care provider if you have a lot of blood in your stool, nausea or vomiting, a fever, or increased abdominal pain. This information is not intended to replace advice given to you by your health care provider. Make sure you discuss any  questions you have with your health care provider. Document Released: 08/22/2003 Document Revised: 10/30/2016 Document Reviewed: 03/21/2015 Elsevier Interactive Patient Education  2019 [place], Care After These instructions provide you with information about caring for yourself after your procedure. Your health care provider may also give you more specific instructions. Your treatment has been planned according to current medical practices, but problems sometimes occur. Call your health care provider if you have any problems or questions after your procedure. What can I expect after the procedure? After your procedure, you may:  Feel sleepy for several hours.  Feel clumsy and have poor balance for several hours.  Feel forgetful about what happened after the procedure.  Have poor judgment for several hours.  Feel nauseous or vomit.  Have a sore throat if you had a breathing tube during the procedure. Follow these instructions at home: For at least 24 hours after the procedure:      Have a responsible adult stay with you. It is important to have someone help care for you until you are awake and alert.  Rest as needed.  Do not: ? Participate in activities in which you could fall or become injured. ? Drive. ? Use heavy machinery. ? Drink alcohol. ? Take sleeping pills or medicines that cause drowsiness. ? Make important decisions or sign legal documents. ? Take care of children on your own. Eating and drinking  Follow the diet that is recommended by your health care provider.  If you vomit, drink water, juice, or soup when you can drink without vomiting.  Make sure you have little or no nausea before eating solid foods. General instructions  Take over-the-counter and prescription medicines only as told by your health care provider.  If you have sleep apnea, surgery and certain medicines can increase your risk for breathing problems. Follow  instructions from your health care provider about wearing your sleep device: ? Anytime you are sleeping, including during daytime naps. ? While taking prescription pain medicines, sleeping medicines, or medicines that make you drowsy.  If you smoke, do not smoke without supervision.  Keep all follow-up visits as told by your health care provider. This is important. Contact a health care provider if:  You keep feeling nauseous or you keep vomiting.  You feel light-headed.  You develop a rash.  You have a fever. Get help right away if:  You have trouble breathing. Summary  For several hours after your procedure, you may feel sleepy and have poor judgment.  Have a responsible adult stay with you for at least 24 hours or until you are awake and alert. This information is not intended to replace advice given to you by your health care provider. Make sure you discuss any questions you have with  your health care provider. Document Released: 04/30/2015 Document Revised: 08/23/2016 Document Reviewed: 04/30/2015 Elsevier Interactive Patient Education  2019 [employer].

## 2018-06-01 NOTE — Patient Instructions (Signed)
Take coumadin 15mg  tonight and tomorrow then stop coumadin for colonoscopy on 5/18.  Will bridge with Lovenox.  See pt instructions for Lovenox schedule.  Resume coumadin and lovenox after procedure until INR >2.0.  5/12  Last dose of coumadin 5/13  No lovenox or coumadin 5/14  Lovenox 150mg  sq twice daily 8am and 8pm 5/15   Lovenox 150mg  sq twice daily 8am and 8pm 5/16   Lovenox 150mg  sq twice daily 8am and 8pm 5/17   Lovenox 150mg  sq 8am only 5/18  No Lovenox in am----colonoscopy----restart coumadin 15mg  pm 5/19   Lovenox 150mg  sq twice daily 8am and 8pm and coumadin 15mg  pm 5/20   Lovenox 150mg  sq twice daily 8am and 8pm and coumadin 15mg  pm 5/21   Lovenox 150mg  sq twice daily 8am and 8pm and coumadin 10mg  pm 5/22   Lovenox 150mg  sq twice daily 8am and 8pm and coumadin 10mg  pm 5/23   Lovenox 150mg  sq twice daily 8am and 8pm and coumadin 10mg  pm 5/24   Lovenox 150mg  sq twice daily 8am and 8pm and coumadin 10mg  pm 5/25   Stop lovenox----coumadin 10mg  pm 5/26   INR check at 12:00 noon

## 2018-06-02 ENCOUNTER — Telehealth: Payer: Self-pay | Admitting: *Deleted

## 2018-06-02 NOTE — Telephone Encounter (Signed)
LMOVM. Need to make aware about COVID-19 testing she will need to have done prior to procedure.

## 2018-06-02 NOTE — Telephone Encounter (Signed)
Patient called back. She is scheduled for pre-op on Thursday. I made her aware they will send her for covid-19 testing and thereafter she will have to be quarantined at home until after procedure. I advised if testing is not done then procedure will be cancelled. She voiced understanding

## 2018-06-04 ENCOUNTER — Other Ambulatory Visit (HOSPITAL_COMMUNITY)
Admission: RE | Admit: 2018-06-04 | Discharge: 2018-06-04 | Disposition: A | Discharge status: Home / Self Care | Payer: Medicaid Other | Source: Ambulatory Visit | Attending: Internal Medicine | Admitting: Internal Medicine

## 2018-06-04 ENCOUNTER — Other Ambulatory Visit: Payer: Self-pay

## 2018-06-04 ENCOUNTER — Encounter (HOSPITAL_COMMUNITY)
Admission: RE | Admit: 2018-06-04 | Discharge: 2018-06-04 | Disposition: A | Discharge status: Home / Self Care | Payer: Medicaid Other | Source: Ambulatory Visit | Attending: Internal Medicine | Admitting: Internal Medicine

## 2018-06-04 DIAGNOSIS — G4733 Obstructive sleep apnea (adult) (pediatric): Secondary | ICD-10-CM | POA: Diagnosis not present

## 2018-06-04 DIAGNOSIS — G4730 Sleep apnea, unspecified: Secondary | ICD-10-CM | POA: Diagnosis not present

## 2018-06-04 DIAGNOSIS — Z1159 Encounter for screening for other viral diseases: Secondary | ICD-10-CM | POA: Diagnosis not present

## 2018-06-04 DIAGNOSIS — Z86711 Personal history of pulmonary embolism: Secondary | ICD-10-CM | POA: Diagnosis not present

## 2018-06-04 DIAGNOSIS — Z86718 Personal history of other venous thrombosis and embolism: Secondary | ICD-10-CM | POA: Diagnosis not present

## 2018-06-04 DIAGNOSIS — Z7901 Long term (current) use of anticoagulants: Secondary | ICD-10-CM | POA: Diagnosis not present

## 2018-06-04 DIAGNOSIS — D509 Iron deficiency anemia, unspecified: Secondary | ICD-10-CM | POA: Diagnosis present

## 2018-06-04 DIAGNOSIS — Z01812 Encounter for preprocedural laboratory examination: Secondary | ICD-10-CM | POA: Insufficient documentation

## 2018-06-04 DIAGNOSIS — F17210 Nicotine dependence, cigarettes, uncomplicated: Secondary | ICD-10-CM | POA: Diagnosis not present

## 2018-06-04 DIAGNOSIS — I10 Essential (primary) hypertension: Secondary | ICD-10-CM | POA: Diagnosis not present

## 2018-06-04 DIAGNOSIS — K640 First degree hemorrhoids: Secondary | ICD-10-CM | POA: Diagnosis not present

## 2018-06-04 DIAGNOSIS — D689 Coagulation defect, unspecified: Secondary | ICD-10-CM | POA: Diagnosis not present

## 2018-06-04 DIAGNOSIS — Z6841 Body Mass Index (BMI) 40.0 and over, adult: Secondary | ICD-10-CM | POA: Diagnosis not present

## 2018-06-04 DIAGNOSIS — I272 Pulmonary hypertension, unspecified: Secondary | ICD-10-CM | POA: Diagnosis not present

## 2018-06-04 DIAGNOSIS — Z79899 Other long term (current) drug therapy: Secondary | ICD-10-CM | POA: Diagnosis not present

## 2018-06-04 LAB — BASIC METABOLIC PANEL
Anion gap: 11 (ref 5–15)
BUN: 14 mg/dL (ref 6–20)
CO2: 24 mmol/L (ref 22–32)
Calcium: 9 mg/dL (ref 8.9–10.3)
Chloride: 104 mmol/L (ref 98–111)
Creatinine, Ser: 0.89 mg/dL (ref 0.44–1.00)
GFR calc Af Amer: >60 mL/min (ref >60)
GFR calc non Af Amer: >60 mL/min (ref >60)
Glucose, Bld: 94 mg/dL (ref 70–99)
Potassium: 4.4 mmol/L (ref 3.5–5.1)
Sodium: 139 mmol/L (ref 135–145)

## 2018-06-04 LAB — CBC WITH DIFFERENTIAL/PLATELET
Abs Immature Granulocytes: 0.02 10*3/uL (ref 0.00–0.07)
Basophils Absolute: 0 10*3/uL (ref 0.0–0.1)
Basophils Relative: 1 %
Eosinophils Absolute: 0.2 10*3/uL (ref 0.0–0.5)
Eosinophils Relative: 4 %
HCT: 39.3 % (ref 36.0–46.0)
Hemoglobin: 12 g/dL (ref 12.0–15.0)
Immature Granulocytes: 0 %
Lymphocytes Relative: 25 %
Lymphs Abs: 1.5 10*3/uL (ref 0.7–4.0)
MCH: 26.2 pg (ref 26.0–34.0)
MCHC: 30.5 g/dL (ref 30.0–36.0)
MCV: 85.8 fL (ref 80.0–100.0)
Monocytes Absolute: 0.5 10*3/uL (ref 0.1–1.0)
Monocytes Relative: 9 %
Neutro Abs: 3.6 10*3/uL (ref 1.7–7.7)
Neutrophils Relative %: 61 %
Platelets: 327 10*3/uL (ref 150–400)
RBC: 4.58 MIL/uL (ref 3.87–5.11)
RDW: 16.8 % — ABNORMAL HIGH (ref 11.5–15.5)
WBC: 5.9 10*3/uL (ref 4.0–10.5)
nRBC: 0 % (ref 0.0–0.2)

## 2018-06-04 LAB — HCG, SERUM, QUALITATIVE: Preg, Serum: NEGATIVE

## 2018-06-05 LAB — NOVEL CORONAVIRUS, NAA (HOSP ORDER, SEND-OUT TO REF LAB; TAT 18-24 HRS): SARS-CoV-2, NAA: NOT DETECTED

## 2018-06-08 ENCOUNTER — Ambulatory Visit (HOSPITAL_COMMUNITY)
Admission: RE | Admit: 2018-06-08 | Discharge: 2018-06-08 | Disposition: A | Discharge status: Home / Self Care | Payer: Medicaid Other | Attending: Internal Medicine | Admitting: Internal Medicine

## 2018-06-08 ENCOUNTER — Other Ambulatory Visit: Payer: Self-pay

## 2018-06-08 ENCOUNTER — Encounter (HOSPITAL_COMMUNITY): Payer: Self-pay | Admitting: *Deleted

## 2018-06-08 ENCOUNTER — Ambulatory Visit (HOSPITAL_COMMUNITY): Payer: Medicaid Other | Admitting: Anesthesiology

## 2018-06-08 ENCOUNTER — Encounter (HOSPITAL_COMMUNITY)
Admission: RE | Disposition: A | Discharge status: Home / Self Care | Payer: Self-pay | Source: Home / Self Care | Attending: Internal Medicine

## 2018-06-08 DIAGNOSIS — D689 Coagulation defect, unspecified: Secondary | ICD-10-CM | POA: Insufficient documentation

## 2018-06-08 DIAGNOSIS — D500 Iron deficiency anemia secondary to blood loss (chronic): Secondary | ICD-10-CM

## 2018-06-08 DIAGNOSIS — Z86718 Personal history of other venous thrombosis and embolism: Secondary | ICD-10-CM | POA: Diagnosis not present

## 2018-06-08 DIAGNOSIS — G4730 Sleep apnea, unspecified: Secondary | ICD-10-CM | POA: Insufficient documentation

## 2018-06-08 DIAGNOSIS — Z6841 Body Mass Index (BMI) 40.0 and over, adult: Secondary | ICD-10-CM | POA: Insufficient documentation

## 2018-06-08 DIAGNOSIS — Z1159 Encounter for screening for other viral diseases: Secondary | ICD-10-CM | POA: Insufficient documentation

## 2018-06-08 DIAGNOSIS — G4733 Obstructive sleep apnea (adult) (pediatric): Secondary | ICD-10-CM | POA: Insufficient documentation

## 2018-06-08 DIAGNOSIS — D509 Iron deficiency anemia, unspecified: Secondary | ICD-10-CM

## 2018-06-08 DIAGNOSIS — F17210 Nicotine dependence, cigarettes, uncomplicated: Secondary | ICD-10-CM | POA: Insufficient documentation

## 2018-06-08 DIAGNOSIS — I10 Essential (primary) hypertension: Secondary | ICD-10-CM | POA: Diagnosis not present

## 2018-06-08 DIAGNOSIS — K640 First degree hemorrhoids: Secondary | ICD-10-CM | POA: Diagnosis not present

## 2018-06-08 DIAGNOSIS — Z86711 Personal history of pulmonary embolism: Secondary | ICD-10-CM | POA: Insufficient documentation

## 2018-06-08 DIAGNOSIS — Z79899 Other long term (current) drug therapy: Secondary | ICD-10-CM | POA: Insufficient documentation

## 2018-06-08 DIAGNOSIS — Z7901 Long term (current) use of anticoagulants: Secondary | ICD-10-CM | POA: Insufficient documentation

## 2018-06-08 DIAGNOSIS — I272 Pulmonary hypertension, unspecified: Secondary | ICD-10-CM | POA: Insufficient documentation

## 2018-06-08 HISTORY — PX: COLONOSCOPY WITH PROPOFOL: SHX5780

## 2018-06-08 SURGERY — COLONOSCOPY WITH PROPOFOL
Anesthesia: Monitor Anesthesia Care

## 2018-06-08 MED ORDER — PROPOFOL 10 MG/ML IV BOLUS
INTRAVENOUS | Status: DC | PRN
  Administered 2018-06-08 (×3): 20 mg via INTRAVENOUS

## 2018-06-08 MED ORDER — CHLORHEXIDINE GLUCONATE CLOTH 2 % EX PADS: 6.0000 | MEDICATED_PAD | Freq: Once | CUTANEOUS | Status: DC

## 2018-06-08 MED ORDER — PROPOFOL 500 MG/50ML IV EMUL
INTRAVENOUS | Status: DC | PRN
  Administered 2018-06-08: 100 ug/kg/min via INTRAVENOUS

## 2018-06-08 MED ORDER — MEPERIDINE HCL 50 MG/ML IJ SOLN: 6.2500 mg | INTRAMUSCULAR | Status: DC | PRN

## 2018-06-08 MED ORDER — HYDROMORPHONE HCL 1 MG/ML IJ SOLN: 0.2500 mg | INTRAMUSCULAR | Status: DC | PRN

## 2018-06-08 MED ORDER — MIDAZOLAM HCL 5 MG/5ML IJ SOLN
INTRAMUSCULAR | Status: DC | PRN
  Administered 2018-06-08: 2 mg via INTRAVENOUS

## 2018-06-08 MED ORDER — KETAMINE HCL 10 MG/ML IJ SOLN
INTRAMUSCULAR | Status: DC | PRN
  Administered 2018-06-08: 20 mg via INTRAVENOUS

## 2018-06-08 MED ORDER — HYDROCODONE-ACETAMINOPHEN 7.5-325 MG PO TABS: 1.0000 | ORAL_TABLET | Freq: Once | ORAL | Status: DC | PRN

## 2018-06-08 MED ORDER — PROMETHAZINE HCL 25 MG/ML IJ SOLN: 6.2500 mg | INTRAMUSCULAR | Status: DC | PRN

## 2018-06-08 MED ORDER — PROPOFOL 10 MG/ML IV BOLUS
INTRAVENOUS | Status: AC
  Filled 2018-06-08: qty 40

## 2018-06-08 MED ORDER — KETAMINE HCL 50 MG/5ML IJ SOSY
PREFILLED_SYRINGE | INTRAMUSCULAR | Status: AC
  Filled 2018-06-08: qty 5

## 2018-06-08 MED ORDER — MIDAZOLAM HCL 2 MG/2ML IJ SOLN
INTRAMUSCULAR | Status: AC
  Filled 2018-06-08: qty 2

## 2018-06-08 MED ORDER — LACTATED RINGERS IV SOLN
INTRAVENOUS | Status: DC
  Administered 2018-06-08: 1000 mL via INTRAVENOUS
  Administered 2018-06-08: 11:00:00 via INTRAVENOUS

## 2018-06-08 MED ORDER — LACTATED RINGERS IV SOLN: INTRAVENOUS | Status: DC

## 2018-06-08 NOTE — Discharge Instructions (Signed)
°  Colonoscopy Discharge Instructions  Read the instructions outlined below and refer to this sheet in the next few weeks. These discharge instructions provide you with general information on caring for yourself after you leave the hospital. Your doctor may also give you specific instructions. While your treatment has been planned according to the most current medical practices available, unavoidable complications occasionally occur. If you have any problems or questions after discharge, call Dr. Gala Romney at [phone]. ACTIVITY  You may resume your regular activity, but move at a slower pace for the next 24 hours.   Take frequent rest periods for the next 24 hours.   Walking will help get rid of the air and reduce the bloated feeling in your belly (abdomen).   No driving for 24 hours (because of the medicine (anesthesia) used during the test).    Do not sign any important legal documents or operate any machinery for 24 hours (because of the anesthesia used during the test).  NUTRITION  Drink plenty of fluids.   You may resume your normal diet as instructed by your doctor.   Begin with a light meal and progress to your normal diet. Heavy or fried foods are harder to digest and may make you feel sick to your stomach (nauseated).   Avoid alcoholic beverages for 24 hours or as instructed.  MEDICATIONS  You may resume your normal medications unless your doctor tells you otherwise.  WHAT YOU CAN EXPECT TODAY  Some feelings of bloating in the abdomen.   Passage of more gas than usual.   Spotting of blood in your stool or on the toilet paper.  IF YOU HAD POLYPS REMOVED DURING THE COLONOSCOPY:  No aspirin products for 7 days or as instructed.   No alcohol for 7 days or as instructed.   Eat a soft diet for the next 24 hours.  FINDING OUT THE RESULTS OF YOUR TEST Not all test results are available during your visit. If your test results are not back during the visit, make an appointment  with your caregiver to find out the results. Do not assume everything is normal if you have not heard from your caregiver or the medical facility. It is important for you to follow up on all of your test results.  SEEK IMMEDIATE MEDICAL ATTENTION IF:  You have more than a spotting of blood in your stool.   Your belly is swollen (abdominal distention).   You are nauseated or vomiting.   You have a temperature over 101.   You have abdominal pain or discomfort that is severe or gets worse throughout the day.    Repeat colonoscopy for screening purposes in 10 years  Follow-up with Drs. Delton Coombes and Deborra Medina  Resume Lovenox today.  Resume Coumadin today.  Continue Lovenox until told to discontinue by the Coumadin clinic.  Discussed findings and recommendations with patient's mother, Hassan Rowan, at [phone] at patient's request.

## 2018-06-08 NOTE — Anesthesia Procedure Notes (Signed)
Procedure Name: [place] Performed by: Ollen Bowl, CRNA Pre-anesthesia Checklist: Patient identified, Emergency Drugs available, Suction available, Timeout performed and Patient being monitored Patient Re-evaluated:Patient Re-evaluated prior to induction Oxygen Delivery Method: Nasal Cannula

## 2018-06-08 NOTE — Transfer of Care (Signed)
Immediate Anesthesia Transfer of Care Note  Patient: [name] [name]  Procedure(s) Performed: COLONOSCOPY WITH PROPOFOL (N/A )  Patient Location: PACU  Anesthesia Type:MAC  Level of Consciousness: awake  Airway & Oxygen Therapy: Patient Spontanous Breathing  Post-op Assessment: Report given to RN  Post vital signs: Reviewed and stable  Last Vitals:  Vitals Value Taken Time  BP 145/60 06/08/2018 10:55 AM  Temp    Pulse 71 06/08/2018 10:58 AM  Resp 26 06/08/2018 10:58 AM  SpO2 95 % 06/08/2018 10:58 AM  Vitals shown include unvalidated device data.  Last Pain:  Vitals:   06/08/18 0832  TempSrc: Oral  PainSc: 0-No pain      Patients Stated Pain Goal: 7 (14/38/88 7579)  Complications: No apparent anesthesia complications

## 2018-06-08 NOTE — Op Note (Signed)
[facility] Patient Name: [name] [name] Procedure Date: 06/08/2018 10:05 AM MRN: [id number] Date of Birth: 06-02-1975 Attending MD: Norvel Richards , MD CSN: [id number] Age: 43 Admit Type: Outpatient Procedure:                Colonoscopy Indications:              Unexplained iron deficiency anemia Providers:                Norvel Richards, MD, Janeece Riggers, RN, Nelma Rothman, Technician Referring MD:              Medicines:                Propofol per Anesthesia Complications:            No immediate complications. Estimated Blood Loss:     Estimated blood loss: none. Procedure:                Pre-Anesthesia Assessment:                           - Prior to the procedure, a History and Physical                            was performed, and patient medications and                            allergies were reviewed. The patient's tolerance of                            previous anesthesia was also reviewed. The risks                            and benefits of the procedure and the sedation                            options and risks were discussed with the patient.                            All questions were answered, and informed consent                            was obtained. Prior Anticoagulants: The patient has                            taken no previous anticoagulant or antiplatelet                            agents. ASA Grade Assessment: II - A patient with                            mild systemic disease. After reviewing the risks  and benefits, the patient was deemed in                            satisfactory condition to undergo the procedure.                           After obtaining informed consent, the colonoscope                            was passed under direct vision. Throughout the                            procedure, the patient's blood pressure, pulse, and                            oxygen  saturations were monitored continuously. The                            CF-[id number] ([id number]) scope was introduced through                            the and advanced to the 10 cm into the ileum. The                            colonoscopy was performed without difficulty. The                            patient tolerated the procedure well. The quality                            of the bowel preparation was adequate. The                            ileocecal valve, appendiceal orifice, and rectum                            were photographed. Scope In: 10:34:28 AM Scope Out: 10:46:39 AM Scope Withdrawal Time: 0 hours 8 minutes 12 seconds  Total Procedure Duration: 0 hours 12 minutes 11 seconds  Findings:      The perianal and digital rectal examinations were normal.      Non-bleeding internal hemorrhoids were found during retroflexion. The       hemorrhoids were moderate, medium-sized and Grade I (internal       hemorrhoids that do not prolapse).      The exam was otherwise without abnormality on direct and retroflexion       views. Impression:               - Non-bleeding internal hemorrhoids.                           - The examination was otherwise normal on direct                            and retroflexion views.                           -  No specimens collected. Moderate Sedation:      The administration of moderate sedation was initiated. Recommendation:           - Patient has a contact number available for                            emergencies. The signs and symptoms of potential                            delayed complications were discussed with the                            patient. Return to normal activities tomorrow.                            Written discharge instructions were provided to the                            patient.                           - Resume previous diet.                           - Continue present medications.                           - Repeat  colonoscopy in 10 years for screening                            purposes.                           - Return to GI office (date not yet determined).                            Resume Lovenox today. Resume Coumadin today. Procedure Code(s):        --- Professional ---                           [phone], Colonoscopy, flexible; diagnostic, including                            collection of specimen(s) by brushing or washing,                            when performed (separate procedure) Diagnosis Code(s):        --- Professional ---                           K64.0, First degree hemorrhoids                           D50.9, Iron deficiency anemia, unspecified CPT copyright 2019 American Medical Association. All rights reserved. The codes documented in this report are preliminary and upon coder review may  be revised to meet current compliance requirements.  Cristopher Estimable. Rourk, MD Norvel Richards, MD 06/08/2018 10:56:38 AM This report has been signed electronically. Number of Addenda: 0

## 2018-06-08 NOTE — H&P (Signed)
@LOGO @   Primary Care Physician:  Rosita Fire, MD Primary Gastroenterologist:  Dr. Gala Romney  Pre-Procedure History & Physical: HPI:  [name] [name] is a 43 y.o. female here for colonoscopy for iron deficiency anemia.  No history of GI bleeding.  Heavy menses.  Somewhat recalcitrant iron deficiency requiring regular infusions.  She is devoid of any GI symptoms.  Past Medical History:  Diagnosis Date  . Abdominal cramps 08/30/2015  . Anemia   . Blood transfusion without reported diagnosis   . Closed left ankle fracture    August 30 2012  . Clotting disorder ([place])   . DVT (deep venous thrombosis) ([facility])    L leg  . Fibroid tumor   . Hidradenitis   . High blood pressure   . Hypertension   . Obesity   . OSA on CPAP   . Oxygen deficiency   . Pregnancy    09/20/14 17 weeks  . Pulmonary embolism ([place]) 04/2013  . Sleep apnea   . Tobacco use disorder     Past Surgical History:  Procedure Laterality Date  . ABSCESS DRAINAGE Bilateral 01/10/15  . CYST REMOVAL TRUNK    . IVC FILTER PLACEMENT ([facility])      Prior to Admission medications   Medication Sig Start Date End Date Taking? Authorizing Provider  amLODipine (NORVASC) 10 MG tablet Take 1 tablet (10 mg total) by mouth daily. 01/31/17  Yes Hagler, Apolonio Schneiders, MD  DULoxetine (CYMBALTA) 60 MG capsule Take 1 capsule (60 mg total) by mouth daily. 05/26/18 07/25/18 Yes King, Diona Foley, NP  lisinopril (PRINIVIL,ZESTRIL) 40 MG tablet Take 1 tablet (40 mg total) by mouth daily. 03/13/17  Yes Caren Macadam, MD  oxyCODONE-acetaminophen (PERCOCET) 10-325 MG tablet Take 1 tablet by mouth every 8 (eight) hours as needed for up to 30 days for pain. 06/28/18 07/28/18 Yes Vevelyn Francois, NP  oxyCODONE-acetaminophen (PERCOCET) 10-325 MG tablet Take 1 tablet by mouth every 8 (eight) hours as needed for up to 30 days for pain. 05/29/18 06/28/18 Yes King, Diona Foley, NP  triamterene-hydrochlorothiazide (MAXZIDE-25) 37.5-25 MG tablet TAKE 1 TABLET BY MOUTH ONCE A DAY.  06/20/17  Yes Hagler, Apolonio Schneiders, MD  warfarin (COUMADIN) 2.5 MG tablet TAKE 2 TABLETS BY MOUTH DAILY ALONG WITH 10 MG TABLET FOR A TOTAL OF 15 MG A DAY. Patient taking differently: Take 15 mg by mouth See admin instructions. TAKE 2 TABLETS BY MOUTH Tuesday AND Saturday ALONG WITH 10 MG TABLET FOR A TOTAL OF 15 MG A DAY. Take 15 mg on Tuesday and Saturday 01/05/18  Yes Branch, Alphonse Guild, MD  enoxaparin (LOVENOX) 150 MG/ML injection Inject 1 mL (150 mg total) into the skin every 12 (twelve) hours. 06/04/18   Arnoldo Lenis, MD  warfarin (COUMADIN) 10 MG tablet Take as directed by Coumadin Clinic 05/15/18   Arnoldo Lenis, MD    Allergies as of 03/24/2018 - Review Complete 03/24/2018  Allergen Reaction Noted  . Sulfa antibiotics Itching, Swelling, and Rash 09/07/2011    Family History  Problem Relation Age of Onset  . Hypertension Mother   . Diabetes Paternal Aunt   . Diabetes Paternal Uncle   . Other Father        blood clots  . Cancer Maternal Grandmother   . Heart attack Paternal Grandmother   . Colon cancer Neg Hx     Social History   Socioeconomic History  . Marital status: Widowed    Spouse name: Not on file  . Number of  children: Not on file  . Years of education: Not on file  . Highest education level: Not on file  Occupational History  . Not on file  Social Needs  . Financial resource strain: Not on file  . Food insecurity:    Worry: Not on file    Inability: Not on file  . Transportation needs:    Medical: Not on file    Non-medical: Not on file  Tobacco Use  . Smoking status: Current Some Day Smoker    Packs/day: 0.25    Years: 18.00    Pack years: 4.50    Types: Cigarettes  . Smokeless tobacco: Never Used  . Tobacco comment: smokes 3 cig when she smokes  Substance and Sexual Activity  . Alcohol use: Yes    Alcohol/week: 0.0 standard drinks    Comment: twice a month  . Drug use: No  . Sexual activity: Not Currently    Birth control/protection: Pill   Lifestyle  . Physical activity:    Days per week: Not on file    Minutes per session: Not on file  . Stress: Not on file  Relationships  . Social connections:    Talks on phone: Not on file    Gets together: Not on file    Attends religious service: Not on file    Active member of club or organization: Not on file    Attends meetings of clubs or organizations: Not on file    Relationship status: Not on file  . Intimate partner violence:    Fear of current or ex partner: Not on file    Emotionally abused: Not on file    Physically abused: Not on file    Forced sexual activity: Not on file  Other Topics Concern  . 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    Has a 42 year old Ray.   Live with parents.   Was working 5 days a weeks.   Not working right now.    Attends church.     Review of Systems: See HPI, otherwise negative ROS  Physical Exam: BP (!) 162/109   Temp 98.7 F (37.1 C) (Oral)   Ht 5\' 10"  (1.778 m)   Wt (!) 164.2 kg   SpO2 95%   BMI 51.94 kg/m  General:   Alert,  Well-developed, well-nourished, pleasant and cooperative in NAD Neck:  Supple; no masses or thyromegaly. No significant cervical adenopathy. Lungs:  Clear throughout to auscultation.   No wheezes, crackles, or rhonchi. No acute distress. Heart:  Regular rate and rhythm; no murmurs, clicks, rubs,  or gallops. Abdomen: Non-distended, normal bowel sounds.  Soft and nontender without appreciable mass or hepatosplenomegaly.  Pulses:  Normal pulses noted. Extremities:  Without clubbing or edema.  Impression/Plan: 43 year old lady with well documented iron deficiency anemia.  Here for colonoscopy. The risks, benefits, limitations, alternatives and imponderables have been reviewed with the patient. Questions have been answered. All parties are agreeable.      Notice: This dictation was prepared with Dragon dictation along with smaller phrase  technology. Any transcriptional errors that result from this process are unintentional and may not be corrected upon review.

## 2018-06-08 NOTE — Anesthesia Postprocedure Evaluation (Signed)
Anesthesia Post Note  Patient: [name] [name]  Procedure(s) Performed: COLONOSCOPY WITH PROPOFOL (N/A )  Patient location during evaluation: PACU Anesthesia Type: MAC Level of consciousness: awake and alert and oriented Pain management: pain level controlled Vital Signs Assessment: post-procedure vital signs reviewed and stable Respiratory status: spontaneous breathing Cardiovascular status: blood pressure returned to baseline Postop Assessment: no apparent nausea or vomiting Anesthetic complications: no     Last Vitals:  Vitals:   06/08/18 0832 06/08/18 1055  BP: (!) 162/109 (!) 145/60  Pulse:  72  Resp:  18  Temp: 37.1 C 36.8 C  SpO2: 95% 97%    Last Pain:  Vitals:   06/08/18 1055  TempSrc:   PainSc: 0-No pain                 Lyndal Reggio

## 2018-06-08 NOTE — Anesthesia Preprocedure Evaluation (Signed)
Anesthesia Evaluation    Airway Mallampati: II       Dental  (+) Teeth Intact   Pulmonary sleep apnea ,     + decreased breath sounds      Cardiovascular  Rhythm:regular     Neuro/Psych  Neuromuscular disease    GI/Hepatic   Endo/Other    Renal/GU      Musculoskeletal   Abdominal   Peds  Hematology  (+) Blood dyscrasia, anemia ,   Anesthesia Other Findings Morbid obesity OSA, state 382# DVT hx, with saddle embolus and Cor [place] Pulmonary htn Ongoing tobacco abuse Elevated BP today- no AM BP meds Physiologically deconditioned.  Largely non-mobile  Reproductive/Obstetrics                             Anesthesia Physical Anesthesia Plan  ASA: IV  Anesthesia Plan: MAC   Post-op Pain Management:    Induction:   PONV Risk Score and Plan:   Airway Management Planned:   Additional Equipment:   Intra-op Plan:   Post-operative Plan:   Informed Consent: I have reviewed the patients History and Physical, chart, labs and discussed the procedure including the risks, benefits and alternatives for the proposed anesthesia with the patient or authorized representative who has indicated his/her understanding and acceptance.       Plan Discussed with: Anesthesiologist  Anesthesia Plan Comments:         Anesthesia Quick Evaluation

## 2018-06-09 ENCOUNTER — Encounter (HOSPITAL_COMMUNITY): Payer: Self-pay | Admitting: Internal Medicine

## 2018-06-16 ENCOUNTER — Other Ambulatory Visit: Payer: Self-pay

## 2018-06-16 ENCOUNTER — Ambulatory Visit (INDEPENDENT_AMBULATORY_CARE_PROVIDER_SITE_OTHER): Payer: Medicaid Other | Admitting: *Deleted

## 2018-06-16 DIAGNOSIS — Z5181 Encounter for therapeutic drug level monitoring: Secondary | ICD-10-CM | POA: Diagnosis not present

## 2018-06-16 DIAGNOSIS — Z86711 Personal history of pulmonary embolism: Secondary | ICD-10-CM | POA: Diagnosis not present

## 2018-06-16 DIAGNOSIS — I2602 Saddle embolus of pulmonary artery with acute cor pulmonale: Secondary | ICD-10-CM

## 2018-06-16 LAB — POCT INR: INR: 2.1 (ref 2.0–3.0)

## 2018-06-16 NOTE — Patient Instructions (Signed)
S/P colonoscopy Stop Lovenox injections. Continue coumadin 10mg  daily except 15mg  on Tuesdays Recheck on 07/01/18

## 2018-06-19 ENCOUNTER — Other Ambulatory Visit: Payer: Self-pay | Admitting: Cardiology

## 2018-06-19 DIAGNOSIS — I2602 Saddle embolus of pulmonary artery with acute cor pulmonale: Secondary | ICD-10-CM

## 2018-06-19 DIAGNOSIS — I2782 Chronic pulmonary embolism: Secondary | ICD-10-CM

## 2018-07-01 ENCOUNTER — Other Ambulatory Visit: Payer: Self-pay

## 2018-07-01 ENCOUNTER — Ambulatory Visit (INDEPENDENT_AMBULATORY_CARE_PROVIDER_SITE_OTHER): Payer: Medicaid Other | Admitting: *Deleted

## 2018-07-01 ENCOUNTER — Inpatient Hospital Stay (HOSPITAL_COMMUNITY): Payer: Medicaid Other | Attending: Hematology

## 2018-07-01 DIAGNOSIS — D509 Iron deficiency anemia, unspecified: Secondary | ICD-10-CM | POA: Insufficient documentation

## 2018-07-01 DIAGNOSIS — Z720 Tobacco use: Secondary | ICD-10-CM | POA: Insufficient documentation

## 2018-07-01 DIAGNOSIS — I2602 Saddle embolus of pulmonary artery with acute cor pulmonale: Secondary | ICD-10-CM | POA: Insufficient documentation

## 2018-07-01 DIAGNOSIS — D500 Iron deficiency anemia secondary to blood loss (chronic): Secondary | ICD-10-CM

## 2018-07-01 DIAGNOSIS — Z5181 Encounter for therapeutic drug level monitoring: Secondary | ICD-10-CM | POA: Diagnosis not present

## 2018-07-01 DIAGNOSIS — Z86711 Personal history of pulmonary embolism: Secondary | ICD-10-CM | POA: Diagnosis not present

## 2018-07-01 LAB — CBC WITH DIFFERENTIAL/PLATELET
Abs Immature Granulocytes: 0.02 10*3/uL (ref 0.00–0.07)
Basophils Absolute: 0 10*3/uL (ref 0.0–0.1)
Basophils Relative: 1 %
Eosinophils Absolute: 0.2 10*3/uL (ref 0.0–0.5)
Eosinophils Relative: 4 %
HCT: 34.7 % — ABNORMAL LOW (ref 36.0–46.0)
Hemoglobin: 10.6 g/dL — ABNORMAL LOW (ref 12.0–15.0)
Immature Granulocytes: 0 %
Lymphocytes Relative: 21 %
Lymphs Abs: 1.1 10*3/uL (ref 0.7–4.0)
MCH: 25.7 pg — ABNORMAL LOW (ref 26.0–34.0)
MCHC: 30.5 g/dL (ref 30.0–36.0)
MCV: 84.2 fL (ref 80.0–100.0)
Monocytes Absolute: 0.3 10*3/uL (ref 0.1–1.0)
Monocytes Relative: 6 %
Neutro Abs: 3.6 10*3/uL (ref 1.7–7.7)
Neutrophils Relative %: 68 %
Platelets: 336 10*3/uL (ref 150–400)
RBC: 4.12 MIL/uL (ref 3.87–5.11)
RDW: 17.6 % — ABNORMAL HIGH (ref 11.5–15.5)
WBC: 5.4 10*3/uL (ref 4.0–10.5)
nRBC: 0 % (ref 0.0–0.2)

## 2018-07-01 LAB — COMPREHENSIVE METABOLIC PANEL
ALT: 14 U/L (ref 0–44)
AST: 14 U/L — ABNORMAL LOW (ref 15–41)
Albumin: 3.3 g/dL — ABNORMAL LOW (ref 3.5–5.0)
Alkaline Phosphatase: 53 U/L (ref 38–126)
Anion gap: 13 (ref 5–15)
BUN: 20 mg/dL (ref 6–20)
CO2: 22 mmol/L (ref 22–32)
Calcium: 9 mg/dL (ref 8.9–10.3)
Chloride: 104 mmol/L (ref 98–111)
Creatinine, Ser: 0.78 mg/dL (ref 0.44–1.00)
GFR calc Af Amer: >60 mL/min (ref >60)
GFR calc non Af Amer: >60 mL/min (ref >60)
Glucose, Bld: 117 mg/dL — ABNORMAL HIGH (ref 70–99)
Potassium: 4.3 mmol/L (ref 3.5–5.1)
Sodium: 139 mmol/L (ref 135–145)
Total Bilirubin: 0.3 mg/dL (ref 0.3–1.2)
Total Protein: 7.8 g/dL (ref 6.5–8.1)

## 2018-07-01 LAB — FERRITIN: Ferritin: 17 ng/mL (ref 11–307)

## 2018-07-01 LAB — LACTATE DEHYDROGENASE: LDH: 125 U/L (ref 98–192)

## 2018-07-01 LAB — POCT INR: INR: 2.3 (ref 2.0–3.0)

## 2018-07-01 NOTE — Patient Instructions (Signed)
Continue coumadin 10mg  daily except 15mg  on Tuesdays Recheck in 4 wks

## 2018-07-01 NOTE — Progress Notes (Signed)
For review.  Please update ordering provider

## 2018-07-07 ENCOUNTER — Other Ambulatory Visit: Payer: Self-pay

## 2018-07-08 ENCOUNTER — Ambulatory Visit (HOSPITAL_COMMUNITY): Payer: Medicaid Other | Admitting: Nurse Practitioner

## 2018-07-08 ENCOUNTER — Ambulatory Visit (HOSPITAL_COMMUNITY): Payer: Self-pay | Admitting: Nurse Practitioner

## 2018-07-08 NOTE — Assessment & Plan Note (Deleted)
1.  Bilateral pulmonary embolisms: - Unprovoked with CT evidence of right strain and clot extending from the distal main pulmonary artery to the left and right pulmonary artery extensions into the LUL, LLL, and lingula branches. -She was hospitalized for right heart strain and had IVC filter placement on 04/2013. -She was seen at [facility] coagulation clinic 03/2015 for lifelong anticoagulation. -She was treated with Lovenox bridged for Coumadin.  She is currently still taking Coumadin. -She is being followed by the Coumadin clinic.  2.  Iron deficiency anemia: - She was referred to GI for an evaluation due to iron deficiency anemia. -She had her colonoscopy on 06/08/2018 which showed nonbleeding internal hemorrhoids.  Next colonoscopy in 10 years. -She was last treated with IV iron on 11/20/2017 -Labs on  3.  Smoking cessation: - She was reeducated on the importance of smoking cessation due to her history of thrombosis. -She reports she is trying to cut back.

## 2018-07-08 NOTE — Progress Notes (Deleted)
[place], [place] [address]   CLINIC:  Medical Oncology/Hematology  PCP:  Rosita Fire, MD [place] [address] [phone]   REASON FOR VISIT: Follow-up for bilateral pulmonary embolisms  CURRENT THERAPY: Lifelong anticoagulation   INTERVAL HISTORY:  [name] [name] 43 y.o. female returns for routine follow-up bilateral PEs. Denies any nausea, vomiting, or diarrhea. Denies any new pains. Had not noticed any recent bleeding such as epistaxis, hematuria or hematochezia. Denies recent chest pain on exertion, shortness of breath on minimal exertion, pre-syncopal episodes, or palpitations. Denies any numbness or tingling in hands or feet. Denies any recent fevers, infections, or recent hospitalizations. Patient reports appetite at ***% and energy level at ***%.  She is eating well and maintaining her weight at this time.    REVIEW OF SYSTEMS:  Review of Systems - Oncology   PAST MEDICAL/SURGICAL HISTORY:  Past Medical History:  Diagnosis Date  . Abdominal cramps 08/30/2015  . Anemia   . Blood transfusion without reported diagnosis   . Closed left ankle fracture    August 30 2012  . Clotting disorder ([facility])   . DVT (deep venous thrombosis) (H[facility])    L leg  . Fibroid tumor   . Hidradenitis   . High blood pressure   . Hypertension   . Obesity   . OSA on CPAP   . Oxygen deficiency   . Pregnancy    09/20/14 17 weeks  . Pulmonary embolism ([facility]) 04/2013  . Sleep apnea   . Tobacco use disorder    Past Surgical History:  Procedure Laterality Date  . ABSCESS DRAINAGE Bilateral 01/10/15  . COLONOSCOPY WITH PROPOFOL N/A 06/08/2018   Procedure: COLONOSCOPY WITH PROPOFOL;  Surgeon: Daneil Dolin, MD;  Location: [facility];  Service: Endoscopy;  Laterality: N/A;  2:30pm  . CYST REMOVAL TRUNK    . IVC FILTER PLACEMENT ([facility] HX)       SOCIAL HISTORY:  Social History   Socioeconomic History  . Marital status:  Widowed    Spouse name: Not on file  . Number of children: Not on file  . Years of education: Not on file  . Highest education level: Not on file  Occupational History  . Not on file  Social Needs  . Financial resource strain: Not on file  . Food insecurity    Worry: Not on file    Inability: Not on file  . Transportation needs    Medical: Not on file    Non-medical: Not on file  Tobacco Use  . Smoking status: Current Some Day Smoker    Packs/day: 0.25    Years: 18.00    Pack years: 4.50    Types: Cigarettes  . Smokeless tobacco: Never Used  . Tobacco comment: smokes 3 cig when she smokes  Substance and Sexual Activity  . Alcohol use: Yes    Alcohol/week: 0.0 standard drinks    Comment: twice a month  . Drug use: No  . Sexual activity: Not Currently    Birth control/protection: Pill  Lifestyle  . Physical activity    Days per week: Not on file    Minutes per session: Not on file  . Stress: Not on file  Relationships  . Social Herbalist on phone: Not on file    Gets together: Not on file    Attends religious service: Not on file    Active member of club or organization:  Not on file    Attends meetings of clubs or organizations: Not on file    Relationship status: Not on file  . Intimate partner violence    Fear of current or ex partner: Not on file    Emotionally abused: Not on file    Physically abused: Not on file    Forced sexual activity: Not on file  Other Topics Concern  . 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    Has a 42 year old Valentine.   Live with parents.   Was working 5 days a weeks.   Not working right now.    Attends church.     FAMILY HISTORY:  Family History  Problem Relation Age of Onset  . Hypertension Mother   . Diabetes Paternal Aunt   . Diabetes Paternal Uncle   . Other Father        blood clots  . Cancer Maternal Grandmother   . Heart attack Paternal  Grandmother   . Colon cancer Neg Hx     CURRENT MEDICATIONS:  Outpatient Encounter Medications as of 07/08/2018  Medication Sig  . amLODipine (NORVASC) 10 MG tablet Take 1 tablet (10 mg total) by mouth daily.  . DULoxetine (CYMBALTA) 60 MG capsule Take 1 capsule (60 mg total) by mouth daily.  Marland Kitchen enoxaparin (LOVENOX) 150 MG/ML injection Inject 1 mL (150 mg total) into the skin every 12 (twelve) hours.  Marland Kitchen lisinopril (PRINIVIL,ZESTRIL) 40 MG tablet Take 1 tablet (40 mg total) by mouth daily.  Marland Kitchen oxyCODONE-acetaminophen (PERCOCET) 10-325 MG tablet Take 1 tablet by mouth every 8 (eight) hours as needed for up to 30 days for pain.  Marland Kitchen triamterene-hydrochlorothiazide (MAXZIDE-25) 37.5-25 MG tablet TAKE 1 TABLET BY MOUTH ONCE A DAY.  Marland Kitchen warfarin (COUMADIN) 10 MG tablet Take as directed by Coumadin Clinic  . warfarin (COUMADIN) 2.5 MG tablet TAKE AS DIRECTED BY COUMADIN CLINIC   Facility-Administered Encounter Medications as of 07/08/2018  Medication  . 0.9 %  sodium chloride infusion    ALLERGIES:  Allergies  Allergen Reactions  . Sulfa Antibiotics Itching, Swelling and Rash    Lips swelling     PHYSICAL EXAM:  ECOG Performance status: 1  There were no vitals filed for this visit. There were no vitals filed for this visit.  Physical Exam   LABORATORY DATA:  I have reviewed the labs as listed.  CBC    Component Value Date/Time   WBC 5.4 07/01/2018 1030   RBC 4.12 07/01/2018 1030   HGB 10.6 (L) 07/01/2018 1030   HGB 12.5 01/18/2015 1653   HCT 34.7 (L) 07/01/2018 1030   HCT 37.7 01/18/2015 1653   PLT 336 07/01/2018 1030   PLT 214 01/18/2015 1653   MCV 84.2 07/01/2018 1030   MCV 95 01/18/2015 1653   MCH 25.7 (L) 07/01/2018 1030   MCHC 30.5 07/01/2018 1030   RDW 17.6 (H) 07/01/2018 1030   RDW 14.3 01/18/2015 1653   LYMPHSABS 1.1 07/01/2018 1030   MONOABS 0.3 07/01/2018 1030   EOSABS 0.2 07/01/2018 1030   BASOSABS 0.0 07/01/2018 1030   CMP Latest Ref Rng & Units 07/01/2018  06/04/2018 12/29/2017  Glucose 70 - 99 mg/dL 117(H) 94 98  BUN 6 - 20 mg/dL 20 14 17   Creatinine 0.44 - 1.00 mg/dL 0.78 0.89 0.86  Sodium 135 - 145 mmol/L 139 139 137  Potassium 3.5 - 5.1 mmol/L 4.3 4.4 4.3  Chloride 98 - 111 mmol/L 104 104 103  CO2 22 - 32 mmol/L 22 24 28   Calcium 8.9 - 10.3 mg/dL 9.0 9.0 9.2  Total Protein 6.5 - 8.1 g/dL 7.8 - 8.4(H)  Total Bilirubin 0.3 - 1.2 mg/dL 0.3 - 0.2(L)  Alkaline Phos 38 - 126 U/L 53 - 66  AST 15 - 41 U/L 14(L) - 14(L)  ALT 0 - 44 U/L 14 - 12       DIAGNOSTIC IMAGING:  I have independently reviewed the scans and discussed with the patient.   I have reviewed Francene Finders, NP's note and agree with the documentation.  I personally performed a face-to-face visit, made revisions and my assessment and plan is as follows.    ASSESSMENT & PLAN:   Bilateral pulmonary embolism ([place] [place]) 1.  Bilateral pulmonary embolisms: - Unprovoked with CT evidence of right strain and clot extending from the distal main pulmonary artery to the left and right pulmonary artery extensions into the LUL, LLL, and lingula branches. -She was hospitalized for right heart strain and had IVC filter placement on 04/2013. -She was seen at [facility] coagulation clinic 03/2015 for lifelong anticoagulation. -She was treated with Lovenox bridged for Coumadin.  She is currently still taking Coumadin. -She is being followed by the Coumadin clinic.  2.  Iron deficiency anemia: - She was referred to GI for an evaluation due to iron deficiency anemia. -She had her colonoscopy on 06/08/2018 which showed nonbleeding internal hemorrhoids.  Next colonoscopy in 10 years. -She was last treated with IV iron on 11/20/2017 -Labs on  3.  Smoking cessation: - She was reeducated on the importance of smoking cessation due to her history of thrombosis. -She reports she is trying to cut back.      Orders placed this encounter:  No orders of the defined types were placed in this encounter.      Francene Finders, FNP-C [place] [phone]

## 2018-07-09 ENCOUNTER — Other Ambulatory Visit: Payer: Self-pay

## 2018-07-09 ENCOUNTER — Encounter (HOSPITAL_COMMUNITY): Payer: Self-pay | Admitting: Nurse Practitioner

## 2018-07-09 ENCOUNTER — Inpatient Hospital Stay (HOSPITAL_BASED_OUTPATIENT_CLINIC_OR_DEPARTMENT_OTHER): Payer: Medicaid Other | Admitting: Nurse Practitioner

## 2018-07-09 VITALS — BP 143/94 | HR 93 | Temp 99.1°F | Resp 16 | Wt 360.0 lb

## 2018-07-09 DIAGNOSIS — Z720 Tobacco use: Secondary | ICD-10-CM

## 2018-07-09 DIAGNOSIS — D509 Iron deficiency anemia, unspecified: Secondary | ICD-10-CM | POA: Diagnosis not present

## 2018-07-09 DIAGNOSIS — D500 Iron deficiency anemia secondary to blood loss (chronic): Secondary | ICD-10-CM

## 2018-07-09 DIAGNOSIS — I2602 Saddle embolus of pulmonary artery with acute cor pulmonale: Secondary | ICD-10-CM

## 2018-07-09 DIAGNOSIS — R5383 Other fatigue: Secondary | ICD-10-CM

## 2018-07-09 NOTE — Addendum Note (Signed)
Addended by: Glennie Isle on: 07/09/2018 01:58 PM   Modules accepted: Orders

## 2018-07-09 NOTE — Progress Notes (Signed)
[place] [place], [place] [address]   CLINIC:  Medical Oncology/Hematology  PCP:  Rosita Fire, MD [place] [address] [phone]   REASON FOR VISIT: Follow-up for saddle PEs and iron deficiency anemia.  CURRENT THERAPY: Lifelong Coumadin and intermittent iron infusions    INTERVAL HISTORY:  [name] [name] 43 y.o. female returns for routine follow-up for saddle PEs and iron deficiency anemia.  She reports she is more fatigued throughout the day she can tell she is in need of more iron.  She denies any bright red bleeding per rectum or melena.  She denies any easy bruising. Denies any nausea, vomiting, or diarrhea. Denies any new pains. Had not noticed any recent bleeding such as epistaxis, hematuria or hematochezia. Denies recent chest pain on exertion, shortness of breath on minimal exertion, pre-syncopal episodes, or palpitations. Denies any numbness or tingling in hands or feet. Denies any recent fevers, infections, or recent hospitalizations. Patient reports appetite at 100% and energy level at 75%.  She is eating well maintaining her weight at this time.    REVIEW OF SYSTEMS:  Review of Systems  Constitutional: Positive for fatigue.  All other systems reviewed and are negative.    PAST MEDICAL/SURGICAL HISTORY:  Past Medical History:  Diagnosis Date   Abdominal cramps 08/30/2015   Anemia    Blood transfusion without reported diagnosis    Closed left ankle fracture    August 30 2012   Clotting disorder [facility])    DVT (deep venous thrombosis) (HCC)    L leg   Fibroid tumor    Hidradenitis    High blood pressure    Hypertension    Obesity    OSA on CPAP    Oxygen deficiency    Pregnancy    09/20/14 17 weeks   Pulmonary embolism ([place]) 04/2013   Sleep apnea    Tobacco use disorder    Past Surgical History:  Procedure Laterality Date   ABSCESS DRAINAGE Bilateral 01/10/15   COLONOSCOPY WITH  PROPOFOL N/A 06/08/2018   Procedure: COLONOSCOPY WITH PROPOFOL;  Surgeon: Daneil Dolin, MD;  Location: [facility];  Service: Endoscopy;  Laterality: N/A;  2:30pm   CYST REMOVAL TRUNK     IVC FILTER PLACEMENT ([facility] HX)       SOCIAL HISTORY:  Social History   Socioeconomic History   Marital status: Widowed    Spouse name: Not on file   Number of children: Not on file   Years of education: Not on file   Highest education level: Not on file  Occupational History   Not on file  Social Needs   Financial resource strain: Not on file   Food insecurity    Worry: Not on file    Inability: Not on file   Transportation needs    Medical: Not on file    Non-medical: Not on file  Tobacco Use   Smoking status: Current Some Day Smoker    Packs/day: 0.25    Years: 18.00    Pack years: 4.50    Types: Cigarettes   Smokeless tobacco: Never Used   Tobacco comment: smokes 3 cig when she smokes  Substance and Sexual Activity   Alcohol use: Yes    Alcohol/week: 0.0 standard drinks    Comment: twice a month   Drug use: No   Sexual activity: Not Currently    Birth control/protection: Pill  Lifestyle   Physical activity    Days  per week: Not on file    Minutes per session: Not on file   Stress: Not on file  Relationships   Social connections    Talks on phone: Not on file    Gets together: Not on file    Attends religious service: Not on file    Active member of club or organization: Not on file    Attends meetings of clubs or organizations: Not on file    Relationship status: Not on file   Intimate partner violence    Fear of current or ex partner: Not on file    Emotionally abused: Not on file    Physically abused: Not on file    Forced sexual activity: Not on file  Other Topics Concern   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    Has a 42 year old [place].   Live with parents.   Was working 5  days a weeks.   Not working right now.    Attends church.     FAMILY HISTORY:  Family History  Problem Relation Age of Onset   Hypertension Mother    Diabetes Paternal Aunt    Diabetes Paternal Uncle    Other Father        blood clots   Cancer Maternal Grandmother    Heart attack Paternal Grandmother    Colon cancer Neg Hx     CURRENT MEDICATIONS:  Outpatient Encounter Medications as of 07/09/2018  Medication Sig   amLODipine (NORVASC) 10 MG tablet Take 1 tablet (10 mg total) by mouth daily.   DULoxetine (CYMBALTA) 60 MG capsule Take 1 capsule (60 mg total) by mouth daily.   lisinopril (PRINIVIL,ZESTRIL) 40 MG tablet Take 1 tablet (40 mg total) by mouth daily.   oxyCODONE-acetaminophen (PERCOCET) 10-325 MG tablet Take 1 tablet by mouth every 8 (eight) hours as needed for up to 30 days for pain.   triamterene-hydrochlorothiazide (MAXZIDE-25) 37.5-25 MG tablet TAKE 1 TABLET BY MOUTH ONCE A DAY.   warfarin (COUMADIN) 10 MG tablet Take as directed by Coumadin Clinic   warfarin (COUMADIN) 2.5 MG tablet TAKE AS DIRECTED BY COUMADIN CLINIC (Patient taking differently: TAKE AS DIRECTED BY COUMADIN CLINIC/ Takes 15 Mg on tuesdays)   [DISCONTINUED] enoxaparin (LOVENOX) 150 MG/ML injection Inject 1 mL (150 mg total) into the skin every 12 (twelve) hours.   Facility-Administered Encounter Medications as of 07/09/2018  Medication   0.9 %  sodium chloride infusion    ALLERGIES:  Allergies  Allergen Reactions   Sulfa Antibiotics Itching, Swelling and Rash    Lips swelling     PHYSICAL EXAM:  ECOG Performance status: 1  Vitals:   07/09/18 1300  BP: (!) 143/94  Pulse: 93  Resp: 16  Temp: 99.1 F (37.3 C)  SpO2: 97%   Filed Weights   07/09/18 1300  Weight: (!) 360 lb (163.3 kg)    Physical Exam Constitutional:      Appearance: She is obese.  Cardiovascular:     Rate and Rhythm: Normal rate and regular rhythm.     Heart sounds: Normal heart sounds.    Pulmonary:     Effort: Pulmonary effort is normal.     Breath sounds: Normal breath sounds.  Abdominal:     General: Bowel sounds are normal.     Palpations: Abdomen is soft.  Musculoskeletal: Normal range of motion.  Skin:    General: Skin is  warm and dry.  Neurological:     Mental Status: She is alert and oriented to person, place, and time. Mental status is at baseline.  Psychiatric:        Mood and Affect: Mood normal.        Behavior: Behavior normal.        Thought Content: Thought content normal.        Judgment: Judgment normal.      LABORATORY DATA:  I have reviewed the labs as listed.  CBC    Component Value Date/Time   WBC 5.4 07/01/2018 1030   RBC 4.12 07/01/2018 1030   HGB 10.6 (L) 07/01/2018 1030   HGB 12.5 01/18/2015 1653   HCT 34.7 (L) 07/01/2018 1030   HCT 37.7 01/18/2015 1653   PLT 336 07/01/2018 1030   PLT 214 01/18/2015 1653   MCV 84.2 07/01/2018 1030   MCV 95 01/18/2015 1653   MCH 25.7 (L) 07/01/2018 1030   MCHC 30.5 07/01/2018 1030   RDW 17.6 (H) 07/01/2018 1030   RDW 14.3 01/18/2015 1653   LYMPHSABS 1.1 07/01/2018 1030   MONOABS 0.3 07/01/2018 1030   EOSABS 0.2 07/01/2018 1030   BASOSABS 0.0 07/01/2018 1030   CMP Latest Ref Rng & Units 07/01/2018 06/04/2018 12/29/2017  Glucose 70 - 99 mg/dL 117(H) 94 98  BUN 6 - 20 mg/dL 20 14 17   Creatinine 0.44 - 1.00 mg/dL 0.78 0.89 0.86  Sodium 135 - 145 mmol/L 139 139 137  Potassium 3.5 - 5.1 mmol/L 4.3 4.4 4.3  Chloride 98 - 111 mmol/L 104 104 103  CO2 22 - 32 mmol/L 22 24 28   Calcium 8.9 - 10.3 mg/dL 9.0 9.0 9.2  Total Protein 6.5 - 8.1 g/dL 7.8 - 8.4(H)  Total Bilirubin 0.3 - 1.2 mg/dL 0.3 - 0.2(L)  Alkaline Phos 38 - 126 U/L 53 - 66  AST 15 - 41 U/L 14(L) - 14(L)  ALT 0 - 44 U/L 14 - 12   I personally performed a face-to-face visit.  All questions were answered to patient's stated satisfaction. Encouraged patient to call with any new concerns or questions before his next visit to the cancer  center and we can certain see him sooner, if needed.     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  Bilateral pulmonary embolisms: - Unprovoked with CT evidence of right strain and clot extending from the distal main pulmonary artery to the left and right pulmonary artery extensions into the LUL, LLL, and lingula branches. -She was hospitalized for right heart strain and had IVC filter placement on 04/2013. -She was seen at [facility] coagulation clinic 03/2015 for lifelong anticoagulation. -She was treated with Lovenox bridged for Coumadin.  She is currently still taking Coumadin. -She was last seen at the Coumadin clinic at the beginning of June and her INR was 2.3 which is therapeutic and on monthly visits now. -She is being followed by the Coumadin clinic.  2.  Iron deficiency anemia: - She was referred to GI for an evaluation due to iron deficiency anemia. -She had her colonoscopy on 06/08/2018 which showed nonbleeding internal hemorrhoids.  Next colonoscopy in 10 years. -She was last treated with IV iron on 11/20/2017 -Labs on 07/01/2018 showed her hemoglobin has dropped to 10.6, ferritin 17. -She does report she is more fatigue and can tell her iron has dropped.  She denies any bright red bleeding per rectum or melena. -I have recommended 2 infusions  of IV iron. -We will see her back in 5 months with repeat labs.  3.  Smoking cessation: - She was reeducated on the importance of smoking cessation due to her history of thrombosis. -She reports she is trying to cut back.      Orders placed this encounter:  Orders Placed This Encounter  Procedures   Lactate dehydrogenase   CBC with Differential/Platelet   Comprehensive metabolic panel   Ferritin   Iron and TIBC   Vitamin B12   VITAMIN D 25 Hydroxy (Vit-D Deficiency, Fractures)   Folate      [name] Finders, FNP-C [place] [phone]

## 2018-07-09 NOTE — Assessment & Plan Note (Addendum)
1.  Bilateral pulmonary embolisms: - Unprovoked with CT evidence of right strain and clot extending from the distal main pulmonary artery to the left and right pulmonary artery extensions into the LUL, LLL, and lingula branches. -She was hospitalized for right heart strain and had IVC filter placement on 04/2013. -She was seen at [facility] coagulation clinic 03/2015 for lifelong anticoagulation. -She was treated with Lovenox bridged for Coumadin.  She is currently still taking Coumadin. -She was last seen at the Coumadin clinic at the beginning of June and her INR was 2.3 which is therapeutic and on monthly visits now. -She is being followed by the Coumadin clinic.  2.  Iron deficiency anemia: - She was referred to GI for an evaluation due to iron deficiency anemia. -She had her colonoscopy on 06/08/2018 which showed nonbleeding internal hemorrhoids.  Next colonoscopy in 10 years. -She was last treated with IV iron on 11/20/2017 -Labs on 07/01/2018 showed her hemoglobin has dropped to 10.6, ferritin 17. -She does report she is more fatigue and can tell her iron has dropped.  She denies any bright red bleeding per rectum or melena. -I have recommended 2 infusions of IV iron. -We will see her back in 5 months with repeat labs.  3.  Smoking cessation: - She was reeducated on the importance of smoking cessation due to her history of thrombosis. -She reports she is trying to cut back.

## 2018-07-09 NOTE — Patient Instructions (Signed)
[facility] at [facility] Discharge Instructions  Follow up in 5 months with labs    Thank you for choosing  Can[facility] Center at [facility] to provide your oncology and hematology care.  To afford each patient quality time with our provider, please arrive at least 15 minutes before your scheduled appointment time.   If you have a lab appointment with the Cancer Center please come in thru the  Main Entrance and check in at the main information desk  You need to re-schedule your appointment should you arrive 10 or more minutes late.  We strive to give you quality time with our providers, and arriving late affects you and other patients whose appointments are after yours.  Also, if you no show three or more times for appointments you may be dismissed from the clinic at the providers discretion.     Again, thank you for choosing [facility].  Our hope is that these requests will decrease the amount of time that you wait before being seen by our physicians.       _____________________________________________________________  Should you have questions after your visit to [facility], please contact our office at [phone] between the hours of 8:00 a.m. and 4:30 p.m.  Voicemails left after 4:00 p.m. will not be returned until the following business day.  For prescription refill requests, have your pharmacy contact our office and allow 72 hours.    Cancer Center Support Programs:   > Cancer Support Group  2nd Tuesday of the month 1pm-2pm, Journey Room    

## 2018-07-16 ENCOUNTER — Inpatient Hospital Stay (HOSPITAL_COMMUNITY): Payer: Medicaid Other

## 2018-07-16 ENCOUNTER — Other Ambulatory Visit: Payer: Self-pay

## 2018-07-16 ENCOUNTER — Encounter (HOSPITAL_COMMUNITY): Payer: Self-pay

## 2018-07-16 VITALS — BP 150/87 | HR 72 | Temp 98.6°F | Resp 16

## 2018-07-16 DIAGNOSIS — I2602 Saddle embolus of pulmonary artery with acute cor pulmonale: Secondary | ICD-10-CM | POA: Diagnosis not present

## 2018-07-16 DIAGNOSIS — D500 Iron deficiency anemia secondary to blood loss (chronic): Secondary | ICD-10-CM

## 2018-07-16 MED ORDER — SODIUM CHLORIDE 0.9 % IV SOLN
510.0000 mg | Freq: Once | INTRAVENOUS | Status: AC
  Administered 2018-07-16: 14:00:00 510 mg via INTRAVENOUS
  Filled 2018-07-16: qty 510

## 2018-07-16 MED ORDER — SODIUM CHLORIDE 0.9 % IV SOLN
Freq: Once | INTRAVENOUS | Status: AC
  Administered 2018-07-16: 14:00:00 via INTRAVENOUS

## 2018-07-16 NOTE — Patient Instructions (Signed)
[facility]  Discharge Instructions:   _______________________________________________________________  Thank you for choosing [facility] at [facility] to provide your oncology and hematology care.  To afford each patient quality time with our providers, please arrive at least 15 minutes before your scheduled appointment.  You need to re-schedule your appointment if you arrive 10 or more minutes late.  We strive to give you quality time with our providers, and arriving late affects you and other patients whose appointments are after yours.  Also, if you no show three or more times for appointments you may be dismissed from the clinic.  Again, thank you for choosing [facility] at [facility]. Our hope is that these requests will allow you access to exceptional care and in a timely manner. _______________________________________________________________  If you have questions after your visit, please contact our office at [phone] between the hours of 8:30 a.m. and 5:00 p.m. Voicemails left after 4:30 p.m. will not be returned until the following business day. _______________________________________________________________  For prescription refill requests, have your pharmacy contact our office. _______________________________________________________________  Recommendations made by the consultant and any test results will be sent to your referring physician. _______________________________________________________________ 

## 2018-07-16 NOTE — Progress Notes (Signed)
Feraheme given per orders. Patient tolerated it well without problems. Vitals stable and discharged home from clinic ambulatory. Follow up as scheduled.  

## 2018-07-20 ENCOUNTER — Encounter: Payer: Self-pay | Admitting: Student in an Organized Health Care Education/Training Program

## 2018-07-21 ENCOUNTER — Other Ambulatory Visit: Payer: Self-pay

## 2018-07-21 ENCOUNTER — Ambulatory Visit
Payer: Medicaid Other | Attending: Student in an Organized Health Care Education/Training Program | Admitting: Student in an Organized Health Care Education/Training Program

## 2018-07-21 ENCOUNTER — Encounter: Payer: Self-pay | Admitting: Student in an Organized Health Care Education/Training Program

## 2018-07-21 DIAGNOSIS — M5136 Other intervertebral disc degeneration, lumbar region: Secondary | ICD-10-CM | POA: Diagnosis not present

## 2018-07-21 DIAGNOSIS — M5442 Lumbago with sciatica, left side: Secondary | ICD-10-CM

## 2018-07-21 DIAGNOSIS — G8929 Other chronic pain: Secondary | ICD-10-CM

## 2018-07-21 DIAGNOSIS — Z79891 Long term (current) use of opiate analgesic: Secondary | ICD-10-CM

## 2018-07-21 DIAGNOSIS — M47816 Spondylosis without myelopathy or radiculopathy, lumbar region: Secondary | ICD-10-CM | POA: Diagnosis not present

## 2018-07-21 DIAGNOSIS — G894 Chronic pain syndrome: Secondary | ICD-10-CM

## 2018-07-21 DIAGNOSIS — M5441 Lumbago with sciatica, right side: Secondary | ICD-10-CM

## 2018-07-21 DIAGNOSIS — Z86711 Personal history of pulmonary embolism: Secondary | ICD-10-CM

## 2018-07-21 DIAGNOSIS — I2602 Saddle embolus of pulmonary artery with acute cor pulmonale: Secondary | ICD-10-CM

## 2018-07-21 MED ORDER — DULOXETINE HCL 60 MG PO CPEP: 60.0000 mg | ORAL_CAPSULE | Freq: Every day | ORAL | 1 refills | Status: DC

## 2018-07-21 MED ORDER — OXYCODONE-ACETAMINOPHEN 10-325 MG PO TABS: 1.0000 | ORAL_TABLET | Freq: Three times a day (TID) | ORAL | 0 refills | Status: DC | PRN

## 2018-07-21 MED ORDER — OXYCODONE-ACETAMINOPHEN 10-325 MG PO TABS: 1.0000 | ORAL_TABLET | Freq: Three times a day (TID) | ORAL | 0 refills | Status: AC | PRN

## 2018-07-21 NOTE — Progress Notes (Signed)
Pain Management Virtual Encounter Note - Virtual Visit via Telephone Telehealth (real-time audio visits between healthcare provider and patient).   Patient's Phone No. & Preferred Pharmacy:  [phone] (home); [phone] (mobile); (Preferred) [phone] [email or web address]  [place], [place] [address] Phone: [phone] Fax: [phone]  [facility] [address] [place] [address] [place] [place] [address] Phone: [phone] Fax: [phone]    Pre-screening note:  Our staff contacted [name] [name] and offered her an "in person", "face-to-face" appointment versus a telephone encounter. She indicated preferring the telephone encounter, at this time.   Reason for Virtual Visit: COVID-19*  Social distancing based on CDC and AMA recommendations.   I contacted [name] [name] on 07/21/2018 via telephone.      I clearly identified myself as Gillis Santa, MD. I verified that I was speaking with the correct person using two identifiers (Name: [name] [name], and date of birth: 1975/12/26).  Advanced Informed Consent I sought verbal advanced consent from [name] [name] for virtual visit interactions. I informed [name] [name] of possible security and privacy concerns, risks, and limitations associated with providing "not-in-person" medical evaluation and management services. I also informed [name] [name] of the availability of "in-person" appointments. Finally, I informed her that there would be a charge for the virtual visit and that she could be  personally, fully or partially, financially responsible for it. [name] [name] expressed understanding and agreed to proceed.   Historic Elements   [name] [name] is a 43 y.o. year old, female patient evaluated today after her last encounter by our practice on 05/26/2018. [name] [name]  has a past medical history of Abdominal cramps (08/30/2015), Anemia, Blood  transfusion without reported diagnosis, Closed left ankle fracture, Clotting disorder ([place]), DVT (deep venous thrombosis) ([place]), Fibroid tumor, Hidradenitis, High blood pressure, Hypertension, Obesity, OSA on CPAP, Oxygen deficiency, Pregnancy, Pulmonary embolism ([place]) (04/2013), Sleep apnea, and Tobacco use disorder. She also  has a past surgical history that includes Cyst removal trunk; Abscess drainage (Bilateral, 01/10/15); IVC FILTER PLACEMENT ([place] HX); and Colonoscopy with propofol (N/A, 06/08/2018). [name] [name] has a current medication list which includes the following prescription(s): amlodipine, duloxetine, lisinopril, oxycodone-acetaminophen, oxycodone-acetaminophen, triamterene-hydrochlorothiazide, warfarin, and warfarin, and the following Facility-Administered Medications: sodium chloride. She  reports that she has been smoking cigarettes. She has a 4.50 pack-year smoking history. She has never used smokeless tobacco. She reports current alcohol use. She reports that she does not use drugs. [name] [name] is allergic to sulfa antibiotics.   HPI  Today, she is being contacted for medication management. No change in medical history since last visit.  Patient's pain is at baseline.  Patient continues multimodal pain regimen as prescribed.  States that it provides pain relief and improvement in functional status.   Pharmacotherapy Assessment   06/28/2018  1   05/26/2018  Oxycodone-Acetaminophen 10-325  90.00 30 Cr Kin   [id number]   Car ([id number])   0  45.00 MME  Private Pay   [place]     Monitoring: Pharmacotherapy: No side-effects or adverse reactions reported. [place] PMP: PDMP reviewed during this encounter.       Compliance: No problems identified. Effectiveness: Clinically acceptable. Plan: Refer to "POC".  Pertinent Labs   SAFETY SCREENING Profile Lab Results  Component Value Date   SARSCOV2NAA NOT DETECTED 06/04/2018   COVIDSOURCE NASOPHARYNGEAL 06/04/2018   MRSAPCR NEGATIVE 04/27/2013   HIV Non  Reactive 03/11/2018  PREGTESTUR NEGATIVE 03/11/2017   Renal Function Lab Results  Component Value Date   BUN 20 07/01/2018   CREATININE 0.78 85/63/1497   BCR NOT APPLICABLE 02/63/7858   GFRAA >60 07/01/2018   GFRNONAA >60 07/01/2018   Hepatic Function Lab Results  Component Value Date   AST 14 (L) 07/01/2018   ALT 14 07/01/2018   ALBUMIN 3.3 (L) 07/01/2018   UDS Summary  Date Value Ref Range Status  01/28/2018 FINAL  Final    Comment:    ==================================================================== TOXASSURE SELECT 13 (MW) ==================================================================== Test                             Result       Flag       Units Drug Absent but Declared for Prescription Verification   Oxycodone                      Not Detected UNEXPECTED ng/mg creat ==================================================================== Test                      Result    Flag   Units      Ref Range   Creatinine              88               mg/dL      >=20 ==================================================================== Declared Medications:  The flagging and interpretation on this report are based on the  following declared medications.  Unexpected results may arise from  inaccuracies in the declared medications.  **Note: The testing scope of this panel includes these medications:  Oxycodone (Percocet)  **Note: The testing scope of this panel does not include following  reported medications:  Acetaminophen (Percocet)  Amlodipine (Norvasc)  Duloxetine (Cymbalta)  Hydrochlorothiazide (Triamterene and HCTZ)  Lisinopril (Prinivil)  Lisinopril (Zestril)  Norethindrone  Triamterene (Triamterene and HCTZ)  Warfarin (Coumadin) ==================================================================== For clinical consultation, please call [phone]. ====================================================================    Note: Above Lab results  reviewed.  Recent imaging  [place] LT BREAST BX W LOC DEV 1ST LESION IMG BX SPEC US GUIDE Addendum: ADDENDUM REPORT: 04/15/2018 14:21   ADDENDUM:  Pathology of the LEFT breast biopsy revealed PSEUDOANGIOMATOUS  STROMAL HYPERPLASIA ([place]). FIBROCYSTIC CHANGES. THERE IS NO  EVIDENCE OF MALIGNANCY.   This was found to be concordant by Dr. Jimmye Norman.   Recommendation: Six month follow-up diagnostic mammogram and  possible ultrasound LEFT breast.   At the patient's request, results and recommendations were relayed  to the patient by phone by Jetta Lout, [place] on 04/15/2018. The  patient stated she did well following the biopsy with no bleeding or  pain. Post biopsy instructions were reviewed with the patient and  all of her questions were answered. She was encouraged to contact  the imaging department of [facility] with any further  questions or concerns.   Addendum by Jetta Lout, RRA on 04/15/2018.   Electronically Signed    By: Dorise Bullion III M.D    On: 04/15/2018 14:21 Narrative: CLINICAL DATA:  Biopsy of left breast  EXAM: ULTRASOUND GUIDED LEFT BREAST CORE NEEDLE BIOPSY  COMPARISON:  Previous exam(s).  FINDINGS: I met with the patient and we discussed the procedure of ultrasound-guided biopsy, including benefits and alternatives. We discussed the high likelihood of a successful procedure. We discussed the risks of the procedure, including infection, bleeding, tissue injury, clip migration, and inadequate  sampling. Informed written consent was given. The usual time-out protocol was performed immediately prior to the procedure.  Lesion quadrant: Lower  Using sterile technique and 1% Lidocaine as local anesthetic, under direct ultrasound visualization, a 12 gauge spring-loaded device was used to perform biopsy of a left breast mass at 3:30 using a lateral approach. At the conclusion of the procedure a tissue marker clip was deployed into the biopsy cavity.  Follow up 2 view mammogram was performed and dictated separately.  IMPRESSION: Ultrasound guided biopsy of a left breast mass 330. No apparent complications.  Electronically Signed: By: Dorise Bullion III M.D On: 04/14/2018 08:52  Assessment  The primary encounter diagnosis was Chronic pain syndrome. Diagnoses of Lumbar spondylosis, Chronic bilateral low back pain with bilateral sciatica, Lumbar degenerative disc disease, Morbid obesity ([place]), Long term current use of opiate analgesic, Lumbar facet arthropathy, History of pulmonary embolism, and Acute saddle pulmonary embolism with acute cor pulmonale ([place]) were also pertinent to this visit.  Plan of Care  I have changed [name] [name]'s oxyCODONE-acetaminophen. I am also having her start on oxyCODONE-acetaminophen. Additionally, I am having her maintain her amLODipine, lisinopril, triamterene-hydrochlorothiazide, warfarin, warfarin, and DULoxetine.  Pharmacotherapy (Medications Ordered): Meds ordered this encounter  Medications  . oxyCODONE-acetaminophen (PERCOCET) 10-325 MG tablet    Sig: Take 1 tablet by mouth every 8 (eight) hours as needed for pain.    Dispense:  90 tablet    Refill:  0    Do not place this medication, or any other prescription from our practice, on "Automatic Refill". Patient may have prescription filled one day early if pharmacy is closed on scheduled refill date.  Marland Kitchen oxyCODONE-acetaminophen (PERCOCET) 10-325 MG tablet    Sig: Take 1 tablet by mouth every 8 (eight) hours as needed for pain.    Dispense:  90 tablet    Refill:  0    Do not place this medication, or any other prescription from our practice, on "Automatic Refill". Patient may have prescription filled one day early if pharmacy is closed on scheduled refill date.  . DULoxetine (CYMBALTA) 60 MG capsule    Sig: Take 1 capsule (60 mg total) by mouth daily.    Dispense:  30 capsule    Refill:  1   Orders:  Orders Placed This Encounter  Procedures   . ToxASSURE Select 13 (MW), Urine    Volume: 30 ml(s). Minimum 3 ml of urine is needed. Document temperature of fresh sample. Indications: Long term (current) use of opiate analgesic (X44.818)   Follow-up plan:   Return in about 9 weeks (around 09/22/2018) for Medication Management.    Recent Visits Date Type Provider Dept  05/26/18 Office Visit Vevelyn Francois, NP [facility]  Showing recent visits within past 90 days and meeting all other requirements   Today's Visits Date Type Provider Dept  07/21/18 Office Visit Gillis Santa, MD [facility]  Showing today's visits and meeting all other requirements   Future Appointments No visits were found meeting these conditions.  Showing future appointments within next 90 days and meeting all other requirements   I discussed the assessment and treatment plan with the patient. The patient was provided an opportunity to ask questions and all were answered. The patient agreed with the plan and demonstrated an understanding of the instructions.  Patient advised to call back or seek an in-person evaluation if the symptoms or condition worsens.  25 mins encounter  Note by: Gillis Santa, MD Date: 07/21/2018;  Time: 11:50 AM  Note: This dictation was prepared with Dragon dictation. Any transcriptional errors that may result from this process are unintentional.  Disclaimer:  * Given the special circumstances of the COVID-19 pandemic, the federal government has announced that the Office for Civil Rights (OCR) will exercise its enforcement discretion and will not impose penalties on physicians using telehealth in the event of noncompliance with regulatory requirements under the [place] and [place] (HIPAA) in connection with the good faith provision of telehealth during the IJLTH-99 national public health emergency. ([place])

## 2018-07-23 ENCOUNTER — Ambulatory Visit (HOSPITAL_COMMUNITY): Payer: Medicaid Other

## 2018-08-03 ENCOUNTER — Inpatient Hospital Stay (HOSPITAL_COMMUNITY): Payer: Medicaid Other | Attending: Hematology

## 2018-08-03 ENCOUNTER — Other Ambulatory Visit: Payer: Self-pay

## 2018-08-03 ENCOUNTER — Encounter (HOSPITAL_COMMUNITY): Payer: Self-pay

## 2018-08-03 VITALS — BP 132/70 | HR 89 | Temp 97.8°F | Resp 18

## 2018-08-03 DIAGNOSIS — D500 Iron deficiency anemia secondary to blood loss (chronic): Secondary | ICD-10-CM

## 2018-08-03 DIAGNOSIS — D509 Iron deficiency anemia, unspecified: Secondary | ICD-10-CM | POA: Insufficient documentation

## 2018-08-03 MED ORDER — SODIUM CHLORIDE 0.9 % IV SOLN
Freq: Once | INTRAVENOUS | Status: AC
  Administered 2018-08-03: 14:00:00 via INTRAVENOUS

## 2018-08-03 MED ORDER — SODIUM CHLORIDE 0.9 % IV SOLN
510.0000 mg | Freq: Once | INTRAVENOUS | Status: AC
  Administered 2018-08-03: 15:00:00 510 mg via INTRAVENOUS
  Filled 2018-08-03: qty 510

## 2018-08-03 NOTE — Patient Instructions (Signed)
[facility]  Discharge Instructions:   _______________________________________________________________  Thank you for choosing [facility] to provide your oncology and hematology care.  To afford each patient quality time with our providers, please arrive at least 15 minutes before your scheduled appointment.  You need to re-schedule your appointment if you arrive 10 or more minutes late.  We strive to give you quality time with our providers, and arriving late affects you and other patients whose appointments are after yours.  Also, if you no show three or more times for appointments you may be dismissed from the clinic.  Again, thank you for choosing [facility]. Our hope is that these requests will allow you access to exceptional care and in a timely manner. _______________________________________________________________  If you have questions after your visit, please contact our office at [phone] between the hours of 8:30 a.m. and 5:00 p.m. Voicemails left after 4:30 p.m. will not be returned until the following business day. _______________________________________________________________  For prescription refill requests, have your pharmacy contact our office. _______________________________________________________________  Recommendations made by the consultant and any test results will be sent to your referring physician. _______________________________________________________________ 

## 2018-08-03 NOTE — Progress Notes (Signed)
Feraheme given today per MD orders. Tolerated infusion without adverse affects. Vital signs stable. No complaints at this time. Discharged from clinic ambulatory. F/U with [facility] as scheduled.  

## 2018-08-25 ENCOUNTER — Telehealth: Payer: Self-pay | Admitting: Emergency Medicine

## 2018-08-25 DIAGNOSIS — G4733 Obstructive sleep apnea (adult) (pediatric): Secondary | ICD-10-CM

## 2018-08-25 NOTE — Telephone Encounter (Signed)
Spoke with patient. She stated that she received an order from [place] stating that the RX on file for her CPAP supplies has now expired. She is requesting to have another order sent to them.   RB, please advise if it is ok for [place] to send in an order for her. Thanks!

## 2018-08-25 NOTE — Telephone Encounter (Signed)
Thank you, OK to send

## 2018-08-25 NOTE — Telephone Encounter (Signed)
Left detailed message for patient stating that I have sent in the RX for her.   Will close encounter.

## 2018-09-03 ENCOUNTER — Other Ambulatory Visit: Payer: Self-pay

## 2018-09-03 ENCOUNTER — Ambulatory Visit (INDEPENDENT_AMBULATORY_CARE_PROVIDER_SITE_OTHER): Payer: Medicaid Other | Admitting: *Deleted

## 2018-09-03 DIAGNOSIS — Z5181 Encounter for therapeutic drug level monitoring: Secondary | ICD-10-CM | POA: Diagnosis not present

## 2018-09-03 DIAGNOSIS — Z86711 Personal history of pulmonary embolism: Secondary | ICD-10-CM

## 2018-09-03 LAB — POCT INR: INR: 2.2 (ref 2.0–3.0)

## 2018-09-03 NOTE — Patient Instructions (Signed)
Continue coumadin 10mg  daily except 15mg  on Tuesdays Recheck in 6 wks

## 2018-09-21 ENCOUNTER — Encounter: Payer: Self-pay | Admitting: Student in an Organized Health Care Education/Training Program

## 2018-09-22 ENCOUNTER — Other Ambulatory Visit: Payer: Self-pay

## 2018-09-22 ENCOUNTER — Telehealth: Payer: Self-pay | Admitting: *Deleted

## 2018-09-22 ENCOUNTER — Ambulatory Visit
Payer: Medicaid Other | Attending: Student in an Organized Health Care Education/Training Program | Admitting: Student in an Organized Health Care Education/Training Program

## 2018-09-22 DIAGNOSIS — I2602 Saddle embolus of pulmonary artery with acute cor pulmonale: Secondary | ICD-10-CM

## 2018-09-22 DIAGNOSIS — M5136 Other intervertebral disc degeneration, lumbar region: Secondary | ICD-10-CM

## 2018-09-22 DIAGNOSIS — G8929 Other chronic pain: Secondary | ICD-10-CM

## 2018-09-22 DIAGNOSIS — M5441 Lumbago with sciatica, right side: Secondary | ICD-10-CM

## 2018-09-22 DIAGNOSIS — M51369 Other intervertebral disc degeneration, lumbar region without mention of lumbar back pain or lower extremity pain: Secondary | ICD-10-CM

## 2018-09-22 DIAGNOSIS — M5442 Lumbago with sciatica, left side: Secondary | ICD-10-CM | POA: Diagnosis not present

## 2018-09-22 DIAGNOSIS — M47816 Spondylosis without myelopathy or radiculopathy, lumbar region: Secondary | ICD-10-CM | POA: Diagnosis not present

## 2018-09-22 DIAGNOSIS — G894 Chronic pain syndrome: Secondary | ICD-10-CM | POA: Diagnosis not present

## 2018-09-22 DIAGNOSIS — M48061 Spinal stenosis, lumbar region without neurogenic claudication: Secondary | ICD-10-CM

## 2018-09-22 DIAGNOSIS — M25572 Pain in left ankle and joints of left foot: Secondary | ICD-10-CM

## 2018-09-22 DIAGNOSIS — Z79891 Long term (current) use of opiate analgesic: Secondary | ICD-10-CM

## 2018-09-22 MED ORDER — OXYCODONE-ACETAMINOPHEN 10-325 MG PO TABS: 1.0000 | ORAL_TABLET | Freq: Two times a day (BID) | ORAL | 0 refills | Status: DC | PRN

## 2018-09-22 NOTE — Telephone Encounter (Signed)
Spoke with Gerald Stabs from pharmacy, [facility]  and did not approve 5 day early refill.  Keep refills to a 30 day interim.  Verbalizes u/o information

## 2018-09-22 NOTE — Telephone Encounter (Signed)
She went to the pharmacy to get her meds and the pharmacy told her the meds were not called in . [facility] in [place], [place]

## 2018-09-22 NOTE — Progress Notes (Signed)
Pain Management Virtual Encounter Note - Virtual Visit via [place] (real-time audio visits between healthcare provider and patient).   Patient's Phone No. & Preferred Pharmacy:  [phone] (home); [phone] (mobile); (Preferred) [phone] [email or web address]  [place], [address] [place] [address] [place] [address] Phone: [phone] Fax: [phone]  [place] [address], [place] - [place] [address] [place] [address] [place] [address] Phone: [phone] Fax: [phone]    Pre-screening note:  Our staff contacted [name] [name] and offered her an "in person", "face-to-face" appointment versus a telephone encounter. She indicated preferring the telephone encounter, at this time.   Reason for Virtual Visit: COVID-19*  Social distancing based on CDC and AMA recommendations.   I contacted [name] [name] on 09/22/2018 via video conference .      I clearly identified myself as Gillis Santa, MD. I verified that I was speaking with the correct person using two identifiers (Name: [name] [name], and date of birth: March 08, 1975).  Advanced Informed Consent I sought verbal advanced consent from [name] [name] for virtual visit interactions. I informed [name] [name] of possible security and privacy concerns, risks, and limitations associated with providing "not-in-person" medical evaluation and management services. I also informed [name] [name] of the availability of "in-person" appointments. Finally, I informed her that there would be a charge for the virtual visit and that she could be  personally, fully or partially, financially responsible for it. [name] [name] expressed understanding and agreed to proceed.   Historic Elements   [name] [name] is a 43 y.o. year old, female patient evaluated today after her last encounter by our practice on 07/21/2018. [name] [name]  has a past medical history of Abdominal cramps (08/30/2015), Anemia,  Blood transfusion without reported diagnosis, Closed left ankle fracture, Clotting disorder ([place]), DVT (deep venous thrombosis) ([place]), Fibroid tumor, Hidradenitis, High blood pressure, Hypertension, Obesity, OSA on CPAP, Oxygen deficiency, Pregnancy, Pulmonary embolism ([place]) (04/2013), Sleep apnea, and Tobacco use disorder. She also  has a past surgical history that includes Cyst removal trunk; Abscess drainage (Bilateral, 01/10/15); IVC FILTER PLACEMENT ([place] HX); and Colonoscopy with propofol (N/A, 06/08/2018). [name] [name] has a current medication list which includes the following prescription(s): amlodipine, duloxetine, lisinopril, oxycodone-acetaminophen, triamterene-hydrochlorothiazide, warfarin, and warfarin, and the following Facility-Administered Medications: sodium chloride. She  reports that she has been smoking cigarettes. She has a 4.50 pack-year smoking history. She has never used smokeless tobacco. She reports current alcohol use. She reports that she does not use drugs. [name] [name] is allergic to sulfa antibiotics.   HPI  Today, she is being contacted for medication management.  No change in medical history since last visit.  Patient's pain is at baseline.  Patient continues multimodal pain regimen as prescribed.  States that it provides pain relief and improvement in functional status.  Patient forgot to come in for a urine toxicology screen that was ordered on her visit on 07/21/2018.  She states that she is leaving for [facility] on Wednesday because her grandma had a stroke and they are going to help place her in a skilled nursing facility.  I informed her that it is very important to follow instructions especially when they come to urine toxicology screens.  This was her one warning.  I will send in a reduced prescription for quantity 60 and place a urine toxicology screen order that the patient will have to come in for.  Patient endorsed understanding.  Pharmacotherapy Assessment  Analgesic:   08/27/2018  1   07/28/2018  Oxycodone-Acetaminophen 10-325  90.00 30 Bi Lat   [id number]   Car ([id number])   0  45.00 MME  Private Pay   [place]    Monitoring: Pharmacotherapy: No side-effects or adverse reactions reported. [place] PMP: PDMP reviewed during this encounter.       Compliance: No problems identified. Effectiveness: Clinically acceptable. Plan: Refer to "POC".  UDS:  Summary  Date Value Ref Range Status  01/28/2018 FINAL  Final    Comment:    ==================================================================== TOXASSURE SELECT 13 (MW) ==================================================================== Test                             Result       Flag       Units Drug Absent but Declared for Prescription Verification   Oxycodone                      Not Detected UNEXPECTED ng/mg creat ==================================================================== Test                      Result    Flag   Units      Ref Range   Creatinine              88               mg/dL      >=20 ==================================================================== Declared Medications:  The flagging and interpretation on this report are based on the  following declared medications.  Unexpected results may arise from  inaccuracies in the declared medications.  **Note: The testing scope of this panel includes these medications:  Oxycodone (Percocet)  **Note: The testing scope of this panel does not include following  reported medications:  Acetaminophen (Percocet)  Amlodipine (Norvasc)  Duloxetine (Cymbalta)  Hydrochlorothiazide (Triamterene and HCTZ)  Lisinopril (Prinivil)  Lisinopril (Zestril)  Norethindrone  Triamterene (Triamterene and HCTZ)  Warfarin (Coumadin) ==================================================================== For clinical consultation, please call [phone]. ====================================================================    Laboratory Chemistry Profile (12 mo)   Renal: 07/01/2018: BUN 20; Creatinine, Ser 0.78  Lab Results  Component Value Date   GFRAA >60 07/01/2018   GFRNONAA >60 07/01/2018   Hepatic: 07/01/2018: Albumin 3.3 Lab Results  Component Value Date   AST 14 (L) 07/01/2018   ALT 14 07/01/2018   Other: No results found for requested labs within last 8760 hours. Note: Above Lab results reviewed.  Assessment  The primary encounter diagnosis was Chronic pain syndrome. Diagnoses of Lumbar spondylosis, Chronic bilateral low back pain with bilateral sciatica, Lumbar degenerative disc disease, Long term current use of opiate analgesic, Lumbar facet arthropathy, Acute saddle pulmonary embolism with acute cor pulmonale ([facility]), Chronic pain of left ankle, and Lumbar foraminal stenosis were also pertinent to this visit.  Plan of Care  I have changed [name] [name]'s oxyCODONE-acetaminophen. I am also having her maintain her amLODipine, lisinopril, triamterene-hydrochlorothiazide, warfarin, warfarin, and DULoxetine.  Pharmacotherapy (Medications Ordered): Meds ordered this encounter  Medications  . oxyCODONE-acetaminophen (PERCOCET) 10-325 MG tablet    Sig: Take 1 tablet by mouth 2 (two) times daily as needed for pain.    Dispense:  60 tablet    Refill:  0    Do not place this medication, or any other prescription from our practice, on "Automatic Refill". Patient may have prescription filled one day early if pharmacy is closed on scheduled refill date.   Orders:  Orders Placed This Encounter  Procedures  . ToxASSURE Select 13 (MW), Urine    Volume: 30 ml(s). Minimum 3 ml of urine is needed. Document temperature of fresh sample. Indications: Long term (current) use of opiate analgesic [id number])   Follow-up plan:   Return in about 4 weeks (around 10/20/2018) for Medication Management, virtual.    Recent Visits Date Type Provider Dept  07/21/18 Office Visit Gillis Santa, MD [facility]  Showing recent visits within past 90  days and meeting all other requirements   Today's Visits Date Type Provider Dept  09/22/18 Office Visit Gillis Santa, MD [facility]  Showing today's visits and meeting all other requirements   Future Appointments No visits were found meeting these conditions.  Showing future appointments within next 90 days and meeting all other requirements   I discussed the assessment and treatment plan with the patient. The patient was provided an opportunity to ask questions and all were answered. The patient agreed with the plan and demonstrated an understanding of the instructions.  Patient advised to call back or seek an in-person evaluation if the symptoms or condition worsens.  Total duration of non-face-to-face encounter:25 minutes.  Note by: Gillis Santa, MD Date: 09/22/2018; Time: 10:33 AM  Note: This dictation was prepared with Dragon dictation. Any transcriptional errors that may result from this process are unintentional.  Disclaimer:  * Given the special circumstances of the COVID-19 pandemic, the federal government has announced that the Office for Civil Rights (OCR) will exercise its enforcement discretion and will not impose penalties on physicians using telehealth in the event of noncompliance with regulatory requirements under the [place] (HIPAA) in connection with the good faith provision of telehealth during the XX123456 national public health emergency. ([place])

## 2018-09-22 NOTE — Telephone Encounter (Signed)
I have reviewed the chart, patient seen today and Rx was sent to [facility] with a reduced qty d/t  Non-compliance of UDS testing.  Need to let patient know this.

## 2018-10-13 ENCOUNTER — Other Ambulatory Visit: Payer: Self-pay | Admitting: Student in an Organized Health Care Education/Training Program

## 2018-10-14 ENCOUNTER — Encounter: Payer: Self-pay | Admitting: Student in an Organized Health Care Education/Training Program

## 2018-10-16 LAB — TOXASSURE SELECT 13 (MW), URINE

## 2018-10-19 ENCOUNTER — Ambulatory Visit
Payer: Medicaid Other | Attending: Student in an Organized Health Care Education/Training Program | Admitting: Student in an Organized Health Care Education/Training Program

## 2018-10-19 ENCOUNTER — Encounter: Payer: Self-pay | Admitting: Student in an Organized Health Care Education/Training Program

## 2018-10-19 ENCOUNTER — Other Ambulatory Visit: Payer: Self-pay

## 2018-10-19 DIAGNOSIS — M25572 Pain in left ankle and joints of left foot: Secondary | ICD-10-CM

## 2018-10-19 DIAGNOSIS — M5136 Other intervertebral disc degeneration, lumbar region: Secondary | ICD-10-CM | POA: Diagnosis not present

## 2018-10-19 DIAGNOSIS — M48061 Spinal stenosis, lumbar region without neurogenic claudication: Secondary | ICD-10-CM

## 2018-10-19 DIAGNOSIS — G894 Chronic pain syndrome: Secondary | ICD-10-CM | POA: Diagnosis not present

## 2018-10-19 DIAGNOSIS — M5441 Lumbago with sciatica, right side: Secondary | ICD-10-CM

## 2018-10-19 DIAGNOSIS — M47816 Spondylosis without myelopathy or radiculopathy, lumbar region: Secondary | ICD-10-CM | POA: Diagnosis not present

## 2018-10-19 DIAGNOSIS — G8929 Other chronic pain: Secondary | ICD-10-CM

## 2018-10-19 DIAGNOSIS — M5442 Lumbago with sciatica, left side: Secondary | ICD-10-CM | POA: Diagnosis not present

## 2018-10-19 MED ORDER — OXYCODONE-ACETAMINOPHEN 10-325 MG PO TABS: 1.0000 | ORAL_TABLET | Freq: Three times a day (TID) | ORAL | 0 refills | Status: AC | PRN

## 2018-10-19 MED ORDER — OXYCODONE-ACETAMINOPHEN 10-325 MG PO TABS: 1.0000 | ORAL_TABLET | Freq: Three times a day (TID) | ORAL | 0 refills | Status: DC | PRN

## 2018-10-19 MED ORDER — DULOXETINE HCL 60 MG PO CPEP: 60.0000 mg | ORAL_CAPSULE | Freq: Every day | ORAL | 1 refills | Status: DC

## 2018-10-19 NOTE — Progress Notes (Signed)
Pain Management Virtual Encounter Note - Virtual Visit via [place] (real-time audio visits between healthcare provider and patient).   Patient's Phone No. & Preferred Pharmacy:  [phone] (home); [phone] (mobile); (Preferred) [phone] [email or web address]  [facility], [address] [place] [address] Phone: [phone] Fax: [phone]  [facility] [name], [place] - [place] [address] [place] [address] [place] [address] Phone: [phone] Fax: [phone]    Pre-screening note:  Our staff contacted [name] [name] and offered her an "in person", "face-to-face" appointment versus a telephone encounter. She indicated preferring the telephone encounter, at this time.   Reason for Virtual Visit: COVID-19*  Social distancing based on CDC and AMA recommendations.   I contacted [name] [name] on 10/19/2018 via video conference.      I clearly identified myself as Gillis Santa, MD. I verified that I was speaking with the correct person using two identifiers (Name: [name] [name], and date of birth: 09/04/1975).  Advanced Informed Consent I sought verbal advanced consent from [name] [name] for virtual visit interactions. I informed [name] [name] of possible security and privacy concerns, risks, and limitations associated with providing "not-in-person" medical evaluation and management services. I also informed [name] [name] of the availability of "in-person" appointments. Finally, I informed her that there would be a charge for the virtual visit and that she could be  personally, fully or partially, financially responsible for it. [name] [name] expressed understanding and agreed to proceed.   Historic Elements   [name] [name] is a 43 y.o. year old, female patient evaluated today after her last encounter by our practice on 09/22/2018. [name] [name]  has a past medical history of Abdominal cramps (08/30/2015), Anemia,  Blood transfusion without reported diagnosis, Closed left ankle fracture, Clotting disorder ([place]), DVT (deep venous thrombosis) ([place]), Fibroid tumor, Hidradenitis, High blood pressure, Hypertension, Obesity, OSA on CPAP, Oxygen deficiency, Pregnancy, Pulmonary embolism ([place]) (04/2013), Sleep apnea, and Tobacco use disorder. She also  has a past surgical history that includes Cyst removal trunk; Abscess drainage (Bilateral, 01/10/15); IVC FILTER PLACEMENT ([place] HX); and Colonoscopy with propofol (N/A, 06/08/2018). [name] [name] has a current medication list which includes the following prescription(s): amlodipine, duloxetine, lisinopril, oxycodone-acetaminophen, oxycodone-acetaminophen, triamterene-hydrochlorothiazide, warfarin, and warfarin, and the following Facility-Administered Medications: sodium chloride. She  reports that she has been smoking cigarettes. She has a 4.50 pack-year smoking history. She has never used smokeless tobacco. She reports current alcohol use. She reports that she does not use drugs. [name] [name] is allergic to sulfa antibiotics.   HPI  Today, she is being contacted for medication management.   Patient completed her urine toxicology [name] as below.  It is appropriate.  We discussed weaning her opioids from quantity 90 to quantity 75.  Patient is agreement with plan.  Will provide prescription for the next 2 months for Percocet 10 mg 3 times daily PRN max quantity 75/month.  Pharmacotherapy Assessment  Analgesic: 09/26/2018  1   09/22/2018  Oxycodone-Acetaminophen 10-325  60.00  30 Bi Lat   [id number]   Car ([id number])   0  30.00 MME  Medicaid   [place]    Monitoring: Pharmacotherapy: No side-effects or adverse reactions reported. [place] PMP: PDMP reviewed during this encounter.       Compliance: No problems identified. Effectiveness: Clinically acceptable. Plan: Refer to "POC".  UDS:  Summary  Date Value Ref Range Status  10/13/2018 Note  Final    Comment:     ====================================================================  ToxASSURE Select 13 (MW) ==================================================================== Test                             Result       Flag       Units Drug Present   Oxycodone                      651                     ng/mg creat   Noroxycodone                   935                     ng/mg creat    Sources of oxycodone include scheduled prescription medications.    Noroxycodone is an expected metabolite of oxycodone. ==================================================================== Test                      Result    Flag   Units      Ref Range   Creatinine              43               mg/dL      >=20 ==================================================================== Declared Medications:  Medication list was not provided. ==================================================================== For clinical consultation, please call [phone]. ====================================================================    Laboratory Chemistry Profile (12 mo)  Renal: 07/01/2018: BUN 20; Creatinine, Ser 0.78  Lab Results  Component Value Date   GFRAA >60 07/01/2018   GFRNONAA >60 07/01/2018   Hepatic: 07/01/2018: Albumin 3.3 Lab Results  Component Value Date   AST 14 (L) 07/01/2018   ALT 14 07/01/2018   Other: No results found for requested labs within last 8760 hours. Note: Above Lab results reviewed.   Assessment  The primary encounter diagnosis was Chronic pain syndrome. Diagnoses of Lumbar spondylosis, Chronic bilateral low back pain with bilateral sciatica, Lumbar degenerative disc disease, Lumbar foraminal stenosis, Chronic pain of left ankle, and Morbid obesity ([place]) were also pertinent to this visit.  Plan of Care  I have changed [name] [name] oxyCODONE-acetaminophen. I am also having her start on oxyCODONE-acetaminophen. Additionally, I am having her maintain her amLODipine, lisinopril,  triamterene-hydrochlorothiazide, warfarin, warfarin, and DULoxetine.  Pharmacotherapy (Medications Ordered): Meds ordered this encounter  Medications  . oxyCODONE-acetaminophen (PERCOCET) 10-325 MG tablet    Sig: Take 1 tablet by mouth every 8 (eight) hours as needed for pain. Max 75/month    Dispense:  75 tablet    Refill:  0    Do not place this medication, or any other prescription from our practice, on "Automatic Refill". Patient may have prescription filled one day early if pharmacy is closed on scheduled refill date.  Marland Kitchen oxyCODONE-acetaminophen (PERCOCET) 10-325 MG tablet    Sig: Take 1 tablet by mouth every 8 (eight) hours as needed for pain. Max 75/month    Dispense:  75 tablet    Refill:  0    Do not place this medication, or any other prescription from our practice, on "Automatic Refill". Patient may have prescription filled one day early if pharmacy is closed on scheduled refill date.  . DULoxetine (CYMBALTA) 60 MG capsule    Sig: Take 1 capsule (60 mg total) by mouth daily.    Dispense:  30 capsule    Refill:  1  Follow-up plan:   Return in about 8 weeks (around 12/14/2018) for Medication Management, virtual.     Recent Visits Date Type Provider Dept  09/22/18 Office Visit Gillis Santa, MD [facility]  07/21/18 Office Visit Gillis Santa, MD [facility]  Showing recent visits within past 90 days and meeting all other requirements   Today's Visits Date Type Provider Dept  10/19/18 Office Visit Gillis Santa, MD [facility]  Showing today's visits and meeting all other requirements   Future Appointments No visits were found meeting these conditions.  Showing future appointments within next 90 days and meeting all other requirements   I discussed the assessment and treatment plan with the patient. The patient was provided an opportunity to ask questions and all were answered. The patient agreed with the plan and demonstrated an  understanding of the instructions.  Patient advised to call back or seek an in-person evaluation if the symptoms or condition worsens.  Total duration of non-face-to-face encounter: 19minutes.  Note by: Gillis Santa, MD Date: 10/19/2018; Time: 3:16 PM  Note: This dictation was prepared with Dragon dictation. Any transcriptional errors that may result from this process are unintentional.  Disclaimer:  * Given the special circumstances of the COVID-19 pandemic, the federal government has announced that the Office for Civil Rights (OCR) will exercise its enforcement discretion and will not impose penalties on physicians using telehealth in the event of noncompliance with regulatory requirements under the [place] (HIPAA) in connection with the good faith provision of telehealth during the XX123456 national public health emergency. ([place])

## 2018-10-20 ENCOUNTER — Other Ambulatory Visit: Payer: Self-pay

## 2018-10-20 ENCOUNTER — Ambulatory Visit (INDEPENDENT_AMBULATORY_CARE_PROVIDER_SITE_OTHER): Payer: Medicaid Other | Admitting: Pharmacist

## 2018-10-20 DIAGNOSIS — Z86711 Personal history of pulmonary embolism: Secondary | ICD-10-CM | POA: Diagnosis not present

## 2018-10-20 DIAGNOSIS — Z5181 Encounter for therapeutic drug level monitoring: Secondary | ICD-10-CM | POA: Diagnosis not present

## 2018-10-20 DIAGNOSIS — I2602 Saddle embolus of pulmonary artery with acute cor pulmonale: Secondary | ICD-10-CM

## 2018-10-20 DIAGNOSIS — I2782 Chronic pulmonary embolism: Secondary | ICD-10-CM

## 2018-10-20 LAB — POCT INR: INR: 1.9 — AB (ref 2.0–3.0)

## 2018-10-20 MED ORDER — WARFARIN SODIUM 10 MG PO TABS: ORAL_TABLET | ORAL | 3 refills | Status: DC

## 2018-10-20 NOTE — Patient Instructions (Signed)
Description   Only use your 10mg  dose of warfarin. Continue taking 1 tablet daily except 1 and 1/2 tablets on Tuesdays Recheck in 4 wks

## 2018-11-23 ENCOUNTER — Other Ambulatory Visit: Payer: Self-pay | Admitting: Student in an Organized Health Care Education/Training Program

## 2018-12-10 ENCOUNTER — Inpatient Hospital Stay (HOSPITAL_COMMUNITY): Payer: Medicaid Other

## 2018-12-10 ENCOUNTER — Encounter: Payer: Self-pay | Admitting: Student in an Organized Health Care Education/Training Program

## 2018-12-10 ENCOUNTER — Ambulatory Visit (HOSPITAL_COMMUNITY): Payer: Medicaid Other | Admitting: Nurse Practitioner

## 2018-12-14 ENCOUNTER — Ambulatory Visit
Payer: Medicaid Other | Attending: Student in an Organized Health Care Education/Training Program | Admitting: Student in an Organized Health Care Education/Training Program

## 2018-12-14 ENCOUNTER — Other Ambulatory Visit: Payer: Self-pay

## 2018-12-14 ENCOUNTER — Encounter: Payer: Self-pay | Admitting: Student in an Organized Health Care Education/Training Program

## 2018-12-14 DIAGNOSIS — Z79891 Long term (current) use of opiate analgesic: Secondary | ICD-10-CM

## 2018-12-14 DIAGNOSIS — M5441 Lumbago with sciatica, right side: Secondary | ICD-10-CM

## 2018-12-14 DIAGNOSIS — G894 Chronic pain syndrome: Secondary | ICD-10-CM | POA: Diagnosis not present

## 2018-12-14 DIAGNOSIS — M47816 Spondylosis without myelopathy or radiculopathy, lumbar region: Secondary | ICD-10-CM | POA: Diagnosis not present

## 2018-12-14 DIAGNOSIS — M5442 Lumbago with sciatica, left side: Secondary | ICD-10-CM

## 2018-12-14 DIAGNOSIS — Z86711 Personal history of pulmonary embolism: Secondary | ICD-10-CM

## 2018-12-14 DIAGNOSIS — M25572 Pain in left ankle and joints of left foot: Secondary | ICD-10-CM

## 2018-12-14 DIAGNOSIS — M5136 Other intervertebral disc degeneration, lumbar region: Secondary | ICD-10-CM

## 2018-12-14 DIAGNOSIS — G8929 Other chronic pain: Secondary | ICD-10-CM

## 2018-12-14 DIAGNOSIS — I2602 Saddle embolus of pulmonary artery with acute cor pulmonale: Secondary | ICD-10-CM

## 2018-12-14 DIAGNOSIS — M48061 Spinal stenosis, lumbar region without neurogenic claudication: Secondary | ICD-10-CM

## 2018-12-14 MED ORDER — OXYCODONE-ACETAMINOPHEN 10-325 MG PO TABS: 1.0000 | ORAL_TABLET | Freq: Two times a day (BID) | ORAL | 0 refills | Status: DC | PRN

## 2018-12-14 MED ORDER — OXYCODONE-ACETAMINOPHEN 10-325 MG PO TABS: 1.0000 | ORAL_TABLET | Freq: Two times a day (BID) | ORAL | 0 refills | Status: AC | PRN

## 2018-12-14 MED ORDER — DULOXETINE HCL 60 MG PO CPEP: 60.0000 mg | ORAL_CAPSULE | Freq: Every day | ORAL | 5 refills | Status: DC

## 2018-12-14 NOTE — Progress Notes (Signed)
Pain Management Virtual Encounter Note - Virtual Visit via [place] (real-time audio visits between healthcare provider and patient).   Patient's Phone No. & Preferred Pharmacy:  [phone] (home); [phone] (mobile); (Preferred) [phone] [email or web address]  [facility], [place] [address] Phone: [phone] Fax: [phone]  [facility] [address], [place] - [place] [address] [place] [address] Phone: [phone] Fax: [phone]    Pre-screening note:  Our staff contacted [name] [name] and offered her an "in person", "face-to-face" appointment versus a telephone encounter. She indicated preferring the telephone encounter, at this time.   Reason for Virtual Visit: COVID-19*  Social distancing based on CDC and AMA recommendations.   I contacted [name] [name] on 12/14/2018 via video conference.      I clearly identified myself as Gillis Santa, MD. I verified that I was speaking with the correct person using two identifiers (Name: [name] [name], and date of birth: 11-02-1975).  Advanced Informed Consent I sought verbal advanced consent from [name] [name] for virtual visit interactions. I informed [name] [name] of possible security and privacy concerns, risks, and limitations associated with providing "not-in-person" medical evaluation and management services. I also informed [name] [name] of the availability of "in-person" appointments. Finally, I informed her that there would be a charge for the virtual visit and that she could be  personally, fully or partially, financially responsible for it. [name] [name] expressed understanding and agreed to proceed.   Historic Elements   [name] [name] is a 43 y.o. year old, female patient evaluated today after her last encounter by our practice on 11/23/2018. [name] [name]  has a past medical history of Abdominal cramps (08/30/2015),  Anemia, Blood transfusion without reported diagnosis, Closed left ankle fracture, Clotting disorder ([place]), DVT (deep venous thrombosis) ([place]), Fibroid tumor, Hidradenitis, High blood pressure, Hypertension, Obesity, OSA on CPAP, Oxygen deficiency, Pregnancy, Pulmonary embolism ([place]) (04/2013), Sleep apnea, and Tobacco use disorder. She also  has a past surgical history that includes Cyst removal trunk; Abscess drainage (Bilateral, 01/10/15); IVC FILTER PLACEMENT ([place] HX); and Colonoscopy with propofol (N/A, 06/08/2018). [name] [name] has a current medication list which includes the following prescription(s): amlodipine, duloxetine, lisinopril, oxycodone-acetaminophen, oxycodone-acetaminophen, triamterene-hydrochlorothiazide, and warfarin, and the following Facility-Administered Medications: sodium chloride. She  reports that she has been smoking cigarettes. She has a 4.50 pack-year smoking history. She has never used smokeless tobacco. She reports current alcohol use. She reports that she does not use drugs. [name] [name] is allergic to sulfa antibiotics.   HPI  Today, she is being contacted for medication management.   No change in medical history since last visit.  Patient's pain is at baseline.  Patient continues multimodal pain regimen as prescribed.  States that it provides pain relief and improvement in functional status.  As discussed with patient at her last visit, we will wean her oxycodone to 10 mg twice daily which she was on previously.  Patient in agreement with plan.  Pharmacotherapy Assessment  Analgesic: Weaning Percocet to 10 mg twice daily as needed, #60/month (from #75)  11/23/2018  1   10/19/2018  Oxycodone-Acetaminophen 10-325  75.00  30 Bi Lat   [id number]   Car (9744)   0  37.50 MME  Medicaid   [place]     Monitoring: Pharmacotherapy: No side-effects or adverse reactions reported. [place] PMP: PDMP reviewed during this encounter.       Compliance: No problems  identified. Effectiveness:  Clinically acceptable. Plan: Refer to "POC".  UDS:  Summary  Date Value Ref Range Status  10/13/2018 Note  Final    Comment:    ==================================================================== ToxASSURE Select 13 (MW) ==================================================================== Test                             Result       Flag       Units Drug Present   Oxycodone                      651                     ng/mg creat   Noroxycodone                   935                     ng/mg creat    Sources of oxycodone include scheduled prescription medications.    Noroxycodone is an expected metabolite of oxycodone. ==================================================================== Test                      Result    Flag   Units      Ref Range   Creatinine              43               mg/dL      >=20 ==================================================================== Declared Medications:  Medication list was not provided. ==================================================================== For clinical consultation, please call [phone]. ====================================================================    Laboratory Chemistry Profile (12 mo)  Renal: 07/01/2018: BUN 20; Creatinine, Ser 0.78  Lab Results  Component Value Date   GFRAA >60 07/01/2018   GFRNONAA >60 07/01/2018   Hepatic: 07/01/2018: Albumin 3.3 Lab Results  Component Value Date   AST 14 (L) 07/01/2018   ALT 14 07/01/2018   Other: No results found for requested labs within last 8760 hours. Note: Above Lab results reviewed.  Assessment  The primary encounter diagnosis was Chronic pain syndrome. Diagnoses of Lumbar spondylosis, Chronic bilateral low back pain with bilateral sciatica, Lumbar degenerative disc disease, Lumbar foraminal stenosis, Chronic pain of left ankle, Morbid obesity ([facility]), Long term current use of opiate analgesic, Lumbar facet arthropathy, Acute saddle pulmonary embolism  with acute cor pulmonale ([place]), and History of pulmonary embolism were also pertinent to this visit.  Plan of Care  I have changed [name] [name]'s oxyCODONE-acetaminophen. I am also having her start on oxyCODONE-acetaminophen. Additionally, I am having her maintain her amLODipine, lisinopril, triamterene-hydrochlorothiazide, warfarin, and DULoxetine.  Wean Percocet as below, continue Cymbalta.  Pharmacotherapy (Medications Ordered): Meds ordered this encounter  Medications  . DULoxetine (CYMBALTA) 60 MG capsule    Sig: Take 1 capsule (60 mg total) by mouth daily.    Dispense:  30 capsule    Refill:  5  . oxyCODONE-acetaminophen (PERCOCET) 10-325 MG tablet    Sig: Take 1 tablet by mouth 2 (two) times daily as needed for pain. For chronic pain    Dispense:  60 tablet    Refill:  0    Do not place this medication, or any other prescription from our practice, on "Automatic Refill". Patient may have prescription filled one day early if pharmacy is closed on scheduled refill date.  Marland Kitchen oxyCODONE-acetaminophen (PERCOCET) 10-325 MG tablet    Sig: Take  1 tablet by mouth 2 (two) times daily as needed for pain. For chronic pain    Dispense:  60 tablet    Refill:  0    Do not place this medication, or any other prescription from our practice, on "Automatic Refill". Patient may have prescription filled one day early if pharmacy is closed on scheduled refill date.   Follow-up plan:   Return in about 10 weeks (around 02/22/2019) for Medication Management, virtual.    Recent Visits Date Type Provider Dept  10/19/18 Office Visit Gillis Santa, MD [facility]  09/22/18 Office Visit Gillis Santa, MD [facility]  Showing recent visits within past 90 days and meeting all other requirements   Today's Visits Date Type Provider Dept  12/14/18 Office Visit Gillis Santa, MD [facility]  Showing today's visits and meeting all other requirements   Future Appointments No  visits were found meeting these conditions.  Showing future appointments within next 90 days and meeting all other requirements   I discussed the assessment and treatment plan with the patient. The patient was provided an opportunity to ask questions and all were answered. The patient agreed with the plan and demonstrated an understanding of the instructions.  Patient advised to call back or seek an in-person evaluation if the symptoms or condition worsens.  Total duration of non-face-to-face encounter: 47minutes.  Note by: Gillis Santa, MD Date: 12/14/2018; Time: 2:06 PM  Note: This dictation was prepared with Dragon dictation. Any transcriptional errors that may result from this process are unintentional.  Disclaimer:  * Given the special circumstances of the COVID-19 pandemic, the federal government has announced that the Office for Civil Rights (OCR) will exercise its enforcement discretion and will not impose penalties on physicians using telehealth in the event of noncompliance with regulatory requirements under the [place] and [place] (HIPAA) in connection with the good faith provision of telehealth during the XX123456 national public health emergency. ([place])

## 2018-12-15 ENCOUNTER — Other Ambulatory Visit: Payer: Self-pay

## 2018-12-15 ENCOUNTER — Inpatient Hospital Stay (HOSPITAL_COMMUNITY): Payer: Medicare Other | Attending: Hematology

## 2018-12-15 DIAGNOSIS — D509 Iron deficiency anemia, unspecified: Secondary | ICD-10-CM | POA: Diagnosis not present

## 2018-12-15 DIAGNOSIS — D500 Iron deficiency anemia secondary to blood loss (chronic): Secondary | ICD-10-CM

## 2018-12-15 LAB — CBC WITH DIFFERENTIAL/PLATELET
Abs Immature Granulocytes: 0.01 10*3/uL (ref 0.00–0.07)
Basophils Absolute: 0 10*3/uL (ref 0.0–0.1)
Basophils Relative: 1 %
Eosinophils Absolute: 0.2 10*3/uL (ref 0.0–0.5)
Eosinophils Relative: 4 %
HCT: 37.5 % (ref 36.0–46.0)
Hemoglobin: 11.1 g/dL — ABNORMAL LOW (ref 12.0–15.0)
Immature Granulocytes: 0 %
Lymphocytes Relative: 21 %
Lymphs Abs: 1.4 10*3/uL (ref 0.7–4.0)
MCH: 26.2 pg (ref 26.0–34.0)
MCHC: 29.6 g/dL — ABNORMAL LOW (ref 30.0–36.0)
MCV: 88.4 fL (ref 80.0–100.0)
Monocytes Absolute: 0.4 10*3/uL (ref 0.1–1.0)
Monocytes Relative: 6 %
Neutro Abs: 4.3 10*3/uL (ref 1.7–7.7)
Neutrophils Relative %: 68 %
Platelets: 341 10*3/uL (ref 150–400)
RBC: 4.24 MIL/uL (ref 3.87–5.11)
RDW: 18.4 % — ABNORMAL HIGH (ref 11.5–15.5)
WBC: 6.3 10*3/uL (ref 4.0–10.5)
nRBC: 0 % (ref 0.0–0.2)

## 2018-12-15 LAB — COMPREHENSIVE METABOLIC PANEL
ALT: 13 U/L (ref 0–44)
AST: 12 U/L — ABNORMAL LOW (ref 15–41)
Albumin: 3.3 g/dL — ABNORMAL LOW (ref 3.5–5.0)
Alkaline Phosphatase: 50 U/L (ref 38–126)
Anion gap: 6 (ref 5–15)
BUN: 13 mg/dL (ref 6–20)
CO2: 26 mmol/L (ref 22–32)
Calcium: 8.6 mg/dL — ABNORMAL LOW (ref 8.9–10.3)
Chloride: 104 mmol/L (ref 98–111)
Creatinine, Ser: 0.73 mg/dL (ref 0.44–1.00)
GFR calc Af Amer: >60 mL/min (ref >60)
GFR calc non Af Amer: >60 mL/min (ref >60)
Glucose, Bld: 99 mg/dL (ref 70–99)
Potassium: 4.7 mmol/L (ref 3.5–5.1)
Sodium: 136 mmol/L (ref 135–145)
Total Bilirubin: 0.3 mg/dL (ref 0.3–1.2)
Total Protein: 8.1 g/dL (ref 6.5–8.1)

## 2018-12-15 LAB — VITAMIN D 25 HYDROXY (VIT D DEFICIENCY, FRACTURES): Vit D, 25-Hydroxy: 9.45 ng/mL — ABNORMAL LOW (ref 30–100)

## 2018-12-15 LAB — IRON AND TIBC
Iron: 19 ug/dL — ABNORMAL LOW (ref 28–170)
Saturation Ratios: 5 % — ABNORMAL LOW (ref 10.4–31.8)
TIBC: 393 ug/dL (ref 250–450)
UIBC: 374 ug/dL

## 2018-12-15 LAB — LACTATE DEHYDROGENASE: LDH: 123 U/L (ref 98–192)

## 2018-12-15 LAB — FOLATE: Folate: 6.8 ng/mL (ref >5.9)

## 2018-12-15 LAB — FERRITIN: Ferritin: 18 ng/mL (ref 11–307)

## 2018-12-15 LAB — VITAMIN B12: Vitamin B-12: 203 pg/mL (ref 180–914)

## 2018-12-21 ENCOUNTER — Other Ambulatory Visit (HOSPITAL_COMMUNITY): Payer: Medicaid Other

## 2018-12-21 ENCOUNTER — Inpatient Hospital Stay (HOSPITAL_BASED_OUTPATIENT_CLINIC_OR_DEPARTMENT_OTHER): Payer: Medicare Other | Admitting: Nurse Practitioner

## 2018-12-21 ENCOUNTER — Other Ambulatory Visit: Payer: Self-pay

## 2018-12-21 DIAGNOSIS — D500 Iron deficiency anemia secondary to blood loss (chronic): Secondary | ICD-10-CM | POA: Diagnosis not present

## 2018-12-21 DIAGNOSIS — E559 Vitamin D deficiency, unspecified: Secondary | ICD-10-CM

## 2018-12-21 DIAGNOSIS — I2699 Other pulmonary embolism without acute cor pulmonale: Secondary | ICD-10-CM | POA: Diagnosis not present

## 2018-12-21 MED ORDER — ERGOCALCIFEROL 1.25 MG (50000 UT) PO CAPS: 50000.0000 [IU] | ORAL_CAPSULE | ORAL | 4 refills | Status: DC

## 2018-12-21 NOTE — Progress Notes (Signed)
[place], [place] [address]   CLINIC:  Medical Oncology/Hematology  PCP:  Rosita Fire, MD [place] [address] [phone]   REASON FOR VISIT: Follow-up for iron deficiency anemia and unprovoked blood clot  CURRENT THERAPY: Intermittent iron infusions and lifelong anticoagulation   INTERVAL HISTORY:  Ms. Dugay 43 y.o. female was called for a telephone visit for iron deficiency anemia.  She reports she has been doing well since her last visit.  She does report she is still fatigued.  She reports the iron infusions did help her but did not last long.  She denies any bright red bleeding per rectum or melena.  She is following up at the Coumadin clinic for her anticoagulation. Denies any nausea, vomiting, or diarrhea. Denies any new pains. Had not noticed any recent bleeding such as epistaxis, hematuria or hematochezia. Denies recent chest pain on exertion, shortness of breath on minimal exertion, pre-syncopal episodes, or palpitations. Denies any numbness or tingling in hands or feet. Denies any recent fevers, infections, or recent hospitalizations. Patient reports appetite at 75% and energy level at 50%.  She is eating well maintaining her weight at this time.     REVIEW OF SYSTEMS:  Review of Systems  Constitutional: Positive for fatigue.  All other systems reviewed and are negative.    PAST MEDICAL/SURGICAL HISTORY:  Past Medical History:  Diagnosis Date  . Abdominal cramps 08/30/2015  . Anemia   . Blood transfusion without reported diagnosis   . Closed left ankle fracture    August 30 2012  . Clotting disorder ([place])   . DVT (deep venous thrombosis) ([facility])    L leg  . Fibroid tumor   . Hidradenitis   . High blood pressure   . Hypertension   . Obesity   . OSA on CPAP   . Oxygen deficiency   . Pregnancy    09/20/14 17 weeks  . Pulmonary embolism ([place]) 04/2013  . Sleep apnea   . Tobacco use disorder     Past Surgical History:  Procedure Laterality Date  . ABSCESS DRAINAGE Bilateral 01/10/15  . COLONOSCOPY WITH PROPOFOL N/A 06/08/2018   Procedure: COLONOSCOPY WITH PROPOFOL;  Surgeon: Daneil Dolin, MD;  Location: [facility];  Service: Endoscopy;  Laterality: N/A;  2:30pm  . CYST REMOVAL TRUNK    . IVC FILTER PLACEMENT ([facility])       SOCIAL HISTORY:  Social History   Socioeconomic History  . Marital status: Widowed    Spouse name: Not on file  . Number of children: Not on file  . Years of education: Not on file  . Highest education level: Not on file  Occupational History  . Not on file  Social Needs  . Financial resource strain: Not on file  . Food insecurity    Worry: Not on file    Inability: Not on file  . Transportation needs    Medical: Not on file    Non-medical: Not on file  Tobacco Use  . Smoking status: Current Some Day Smoker    Packs/day: 0.25    Years: 18.00    Pack years: 4.50    Types: Cigarettes  . Smokeless tobacco: Never Used  . Tobacco comment: smokes 3 cig when she smokes  Substance and Sexual Activity  . Alcohol use: Yes    Alcohol/week: 0.0 standard drinks    Comment: twice a month  . Drug use: No  .  Sexual activity: Not Currently    Birth control/protection: Pill  Lifestyle  . Physical activity    Days per week: Not on file    Minutes per session: Not on file  . Stress: Not on file  Relationships  . Social Herbalist on phone: Not on file    Gets together: Not on file    Attends religious service: Not on file    Active member of club or organization: Not on file    Attends meetings of clubs or organizations: Not on file    Relationship status: Not on file  . Intimate partner violence    Fear of current or ex partner: Not on file    Emotionally abused: Not on file    Physically abused: Not on file    Forced sexual activity: Not on file  Other Topics Concern  . 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    Has a 42 year old [place].   Live with parents.   Was working 5 days a weeks.   Not working right now.    Attends church.     FAMILY HISTORY:  Family History  Problem Relation Age of Onset  . Hypertension Mother   . Diabetes Paternal Aunt   . Diabetes Paternal Uncle   . Other Father        blood clots  . Cancer Maternal Grandmother   . Heart attack Paternal Grandmother   . Colon cancer Neg Hx     CURRENT MEDICATIONS:  Outpatient Encounter Medications as of 12/21/2018  Medication Sig  . amLODipine (NORVASC) 10 MG tablet Take 1 tablet (10 mg total) by mouth daily.  . DULoxetine (CYMBALTA) 60 MG capsule Take 1 capsule (60 mg total) by mouth daily.  . ergocalciferol (VITAMIN D2) 1.25 MG (50000 UT) capsule Take 1 capsule (50,000 Units total) by mouth once a week.  Marland Kitchen lisinopril (PRINIVIL,ZESTRIL) 40 MG tablet Take 1 tablet (40 mg total) by mouth daily.  Derrill Memo ON 01/21/2019] oxyCODONE-acetaminophen (PERCOCET) 10-325 MG tablet Take 1 tablet by mouth 2 (two) times daily as needed for pain. For chronic pain  . [START ON 12/22/2018] oxyCODONE-acetaminophen (PERCOCET) 10-325 MG tablet Take 1 tablet by mouth 2 (two) times daily as needed for pain. For chronic pain  . triamterene-hydrochlorothiazide (MAXZIDE-25) 37.5-25 MG tablet TAKE 1 TABLET BY MOUTH ONCE A DAY.  Marland Kitchen warfarin (COUMADIN) 10 MG tablet Take 1 to 1.5 tablets daily as directed by Coumadin Clinic   Facility-Administered Encounter Medications as of 12/21/2018  Medication  . 0.9 %  sodium chloride infusion    ALLERGIES:  Allergies  Allergen Reactions  . Sulfa Antibiotics Itching, Swelling and Rash    Lips swelling     Vital signs: -Deferred due to telephone visit  Physical Exam Deferred due to telephone visit Patient was alert and oriented and in no acute distress during her telephone interview.  LABORATORY DATA:  I have reviewed the labs as listed.  CBC     Component Value Date/Time   WBC 6.3 12/15/2018 1213   RBC 4.24 12/15/2018 1213   HGB 11.1 (L) 12/15/2018 1213   HGB 12.5 01/18/2015 1653   HCT 37.5 12/15/2018 1213   HCT 37.7 01/18/2015 1653   PLT 341 12/15/2018 1213   PLT 214 01/18/2015 1653   MCV 88.4 12/15/2018 1213   MCV 95 01/18/2015 1653   MCH 26.2 12/15/2018  1213   MCHC 29.6 (L) 12/15/2018 1213   RDW 18.4 (H) 12/15/2018 1213   RDW 14.3 01/18/2015 1653   LYMPHSABS 1.4 12/15/2018 1213   MONOABS 0.4 12/15/2018 1213   EOSABS 0.2 12/15/2018 1213   BASOSABS 0.0 12/15/2018 1213   CMP Latest Ref Rng & Units 12/15/2018 07/01/2018 06/04/2018  Glucose 70 - 99 mg/dL 99 117(H) 94  BUN 6 - 20 mg/dL 13 20 14   Creatinine 0.44 - 1.00 mg/dL 0.73 0.78 0.89  Sodium 135 - 145 mmol/L 136 139 139  Potassium 3.5 - 5.1 mmol/L 4.7 4.3 4.4  Chloride 98 - 111 mmol/L 104 104 104  CO2 22 - 32 mmol/L 26 22 24   Calcium 8.9 - 10.3 mg/dL 8.6(L) 9.0 9.0  Total Protein 6.5 - 8.1 g/dL 8.1 7.8 -  Total Bilirubin 0.3 - 1.2 mg/dL 0.3 0.3 -  Alkaline Phos 38 - 126 U/L 50 53 -  AST 15 - 41 U/L 12(L) 14(L) -  ALT 0 - 44 U/L 13 14 -    All questions were answered to patient's stated satisfaction. Encouraged patient to call with any new concerns or questions before his next visit to the cancer center and we can certain see him sooner, if needed.      ASSESSMENT & PLAN:   Bilateral pulmonary embolism ([place]) 1.  Bilateral pulmonary embolisms: - Unprovoked with CT evidence of right strain and clot extending from the distal main pulmonary artery to the left and right pulmonary artery extensions into the LUL, LLL, and lingula branches. -She was hospitalized for right heart strain and had IVC filter placement on 04/2013. -She was seen at [facility] coagulation clinic 03/2015 for lifelong anticoagulation. -She was treated with Lovenox bridged for Coumadin.  She is currently still taking Coumadin. -She was last seen at the Coumadin clinic at the beginning of June and her INR  was 2.3 which is therapeutic and on monthly visits now. -Her last INR was 1.9.  She is still following up with the Coumadin clinic regularly. -She is being followed by the Coumadin clinic.  2.  Iron deficiency anemia: - She was referred to GI for an evaluation due to iron deficiency anemia. -She had her colonoscopy on 06/08/2018 which showed nonbleeding internal hemorrhoids.  Next colonoscopy in 10 years. -She was last treated with IV iron on 07/16/2018 and 08/03/2018.  She reports the iron did help with her fatigue however it did not last long. -Labs on 11/24 /2020 which showed hemoglobin 11.1, ferritin 18, percent saturation 5, platelets 341, creatinine 0.73.  Her vitamin B-12 was low at 203. -She does report she is fatigue and can tell her iron is still low.  She denies any bright red bleeding per rectum or melena. -I have recommended 2 infusions of IV iron.  We also started her on vitamin B12 tablets daily. -We will see her back in 5 months with repeat labs.  3.  Vitamin D deficiency: -Labs on 12/15/2018 showed her vitamin D level 9.45. -We called and a prescription for vitamin D 50,000 units weekly. -We will recheck her labs at her next visit.  4.  Smoking cessation: - She was reeducated on the importance of smoking cessation due to her history of thrombosis. -She reports she is trying to cut back.      Orders placed this encounter:  Orders Placed This Encounter  Procedures  . Lactate dehydrogenase  . CBC with Differential/Platelet  . Comprehensive metabolic panel  . Ferritin  . Iron and  TIBC  . Vitamin B12  . Vitamin D 25 hydroxy  . Folate      Francene Finders, FNP-C [facility] [phone]

## 2018-12-21 NOTE — Assessment & Plan Note (Addendum)
1.  Bilateral pulmonary embolisms: - Unprovoked with CT evidence of right strain and clot extending from the distal main pulmonary artery to the left and right pulmonary artery extensions into the LUL, LLL, and lingula branches. -She was hospitalized for right heart strain and had IVC filter placement on 04/2013. -She was seen at [facility] coagulation clinic 03/2015 for lifelong anticoagulation. -She was treated with Lovenox bridged for Coumadin.  She is currently still taking Coumadin. -She was last seen at the Coumadin clinic at the beginning of June and her INR was 2.3 which is therapeutic and on monthly visits now. -Her last INR was 1.9.  She is still following up with the Coumadin clinic regularly. -She is being followed by the Coumadin clinic.  2.  Iron deficiency anemia: - She was referred to GI for an evaluation due to iron deficiency anemia. -She had her colonoscopy on 06/08/2018 which showed nonbleeding internal hemorrhoids.  Next colonoscopy in 10 years. -She was last treated with IV iron on 07/16/2018 and 08/03/2018.  She reports the iron did help with her fatigue however it did not last long. -Labs on 11/24 /2020 which showed hemoglobin 11.1, ferritin 18, percent saturation 5, platelets 341, creatinine 0.73.  Her vitamin B-12 was low at 203. -She does report she is fatigue and can tell her iron is still low.  She denies any bright red bleeding per rectum or melena. -I have recommended 2 infusions of IV iron.  We also started her on vitamin B12 tablets daily. -We will see her back in 5 months with repeat labs.  3.  Vitamin D deficiency: -Labs on 12/15/2018 showed her vitamin D level 9.45. -We called and a prescription for vitamin D 50,000 units weekly. -We will recheck her labs at her next visit.  4.  Smoking cessation: - She was reeducated on the importance of smoking cessation due to her history of thrombosis. -She reports she is trying to cut back.

## 2018-12-24 ENCOUNTER — Other Ambulatory Visit: Payer: Self-pay

## 2018-12-24 ENCOUNTER — Inpatient Hospital Stay (HOSPITAL_COMMUNITY): Payer: Medicare Other | Attending: Hematology

## 2018-12-24 ENCOUNTER — Encounter (HOSPITAL_COMMUNITY): Payer: Self-pay

## 2018-12-24 VITALS — BP 143/74 | HR 80 | Temp 97.7°F | Resp 18

## 2018-12-24 DIAGNOSIS — D500 Iron deficiency anemia secondary to blood loss (chronic): Secondary | ICD-10-CM

## 2018-12-24 DIAGNOSIS — D509 Iron deficiency anemia, unspecified: Secondary | ICD-10-CM | POA: Diagnosis present

## 2018-12-24 MED ORDER — SODIUM CHLORIDE 0.9 % IV SOLN
INTRAVENOUS | Status: DC
  Administered 2018-12-24: 15:00:00 via INTRAVENOUS

## 2018-12-24 MED ORDER — SODIUM CHLORIDE 0.9 % IV SOLN
510.0000 mg | Freq: Once | INTRAVENOUS | Status: AC
  Administered 2018-12-24: 510 mg via INTRAVENOUS
  Filled 2018-12-24: qty 510

## 2018-12-24 NOTE — Progress Notes (Signed)
Iron given per orders. Patient tolerated it well without problems. Vitals stable and discharged home from clinic ambulatory. Follow up as scheduled.  

## 2018-12-31 ENCOUNTER — Ambulatory Visit (HOSPITAL_COMMUNITY): Payer: Medicaid Other

## 2018-12-31 ENCOUNTER — Inpatient Hospital Stay (HOSPITAL_COMMUNITY): Payer: Medicare Other

## 2018-12-31 ENCOUNTER — Other Ambulatory Visit: Payer: Self-pay

## 2018-12-31 VITALS — BP 147/82 | HR 84 | Temp 96.9°F | Resp 18

## 2018-12-31 DIAGNOSIS — D500 Iron deficiency anemia secondary to blood loss (chronic): Secondary | ICD-10-CM

## 2018-12-31 DIAGNOSIS — D509 Iron deficiency anemia, unspecified: Secondary | ICD-10-CM | POA: Diagnosis not present

## 2018-12-31 MED ORDER — SODIUM CHLORIDE 0.9 % IV SOLN
INTRAVENOUS | Status: DC
  Administered 2018-12-31: 10:00:00 via INTRAVENOUS

## 2018-12-31 MED ORDER — SODIUM CHLORIDE 0.9 % IV SOLN
510.0000 mg | Freq: Once | INTRAVENOUS | Status: AC
  Administered 2018-12-31: 10:00:00 510 mg via INTRAVENOUS
  Filled 2018-12-31: qty 510

## 2018-12-31 NOTE — Patient Instructions (Signed)
[facility] at [facility]  Discharge Instructions:  IV Feraheme received today.  Ferumoxytol injection What is this medicine? FERUMOXYTOL is an iron complex. Iron is used to make healthy red blood cells, which carry oxygen and nutrients throughout the body. This medicine is used to treat iron deficiency anemia. This medicine may be used for other purposes; ask your health care provider or pharmacist if you have questions. COMMON BRAND NAME(S): Feraheme What should I tell my health care provider before I take this medicine? They need to know if you have any of these conditions:  anemia not caused by low iron levels  high levels of iron in the blood  magnetic resonance imaging (MRI) test scheduled  an unusual or allergic reaction to iron, other medicines, foods, dyes, or preservatives  pregnant or trying to get pregnant  breast-feeding How should I use this medicine? This medicine is for injection into a vein. It is given by a health care professional in a hospital or clinic setting. Talk to your pediatrician regarding the use of this medicine in children. Special care may be needed. Overdosage: If you think you have taken too much of this medicine contact a poison control center or emergency room at once. NOTE: This medicine is only for you. Do not share this medicine with others. What if I miss a dose? It is important not to miss your dose. Call your doctor or health care professional if you are unable to keep an appointment. What may interact with this medicine? This medicine may interact with the following medications:  other iron products This list may not describe all possible interactions. Give your health care provider a list of all the medicines, herbs, non-prescription drugs, or dietary supplements you use. Also tell them if you smoke, drink alcohol, or use illegal drugs. Some items may interact with your medicine. What should I watch for while  using this medicine? Visit your doctor or healthcare professional regularly. Tell your doctor or healthcare professional if your symptoms do not start to get better or if they get worse. You may need blood work done while you are taking this medicine. You may need to follow a special diet. Talk to your doctor. Foods that contain iron include: whole grains/cereals, dried fruits, beans, or peas, leafy green vegetables, and organ meats (liver, kidney). What side effects may I notice from receiving this medicine? Side effects that you should report to your doctor or health care professional as soon as possible:  allergic reactions like skin rash, itching or hives, swelling of the face, lips, or tongue  breathing problems  changes in blood pressure  feeling faint or lightheaded, falls  fever or chills  flushing, sweating, or hot feelings  swelling of the ankles or feet Side effects that usually do not require medical attention (report to your doctor or health care professional if they continue or are bothersome):  diarrhea  headache  nausea, vomiting  stomach pain This list may not describe all possible side effects. Call your doctor for medical advice about side effects. You may report side effects to FDA at 1-800-FDA-1088. Where should I keep my medicine? This drug is given in a hospital or clinic and will not be stored at home. NOTE: This sheet is a summary. It may not cover all possible information. If you have questions about this medicine, talk to your doctor, pharmacist, or health care provider.  2020 Elsevier/Gold Standard (2016-02-26 20:21:10)  _______________________________________________________________  Thank you for choosing   [facility] to provide your oncology and hematology care.  To afford each patient quality time with our providers, please arrive at least 15 minutes before your scheduled appointment.  You need to re-schedule  your appointment if you arrive 10 or more minutes late.  We strive to give you quality time with our providers, and arriving late affects you and other patients whose appointments are after yours.  Also, if you no show three or more times for appointments you may be dismissed from the clinic.  Again, thank you for choosing [facility]. Our hope is that these requests will allow you access to exceptional care and in a timely manner. _______________________________________________________________  If you have questions after your visit, please contact our office at [phone] between the hours of 8:30 a.m. and 5:00 p.m. Voicemails left after 4:30 p.m. will not be returned until the following business day. _______________________________________________________________  For prescription refill requests, have your pharmacy contact our office. _______________________________________________________________  Recommendations made by the consultant and any test results will be sent to your referring physician. _______________________________________________________________ 

## 2018-12-31 NOTE — Progress Notes (Signed)
[name] [name] presents today for IV iron infusion. Infusion tolerated without incident or complaint. See MAR for details. VSS prior to and post infusion. Discharged in satisfactory condition with follow up instructions.

## 2019-02-12 ENCOUNTER — Encounter: Payer: Self-pay | Admitting: Cardiology

## 2019-02-12 ENCOUNTER — Telehealth: Payer: Self-pay | Admitting: Cardiology

## 2019-02-12 ENCOUNTER — Telehealth (INDEPENDENT_AMBULATORY_CARE_PROVIDER_SITE_OTHER): Payer: Medicare Other | Admitting: Cardiology

## 2019-02-12 VITALS — BP 127/78 | Ht 70.0 in | Wt 362.0 lb

## 2019-02-12 DIAGNOSIS — I10 Essential (primary) hypertension: Secondary | ICD-10-CM

## 2019-02-12 DIAGNOSIS — Z86711 Personal history of pulmonary embolism: Secondary | ICD-10-CM

## 2019-02-12 NOTE — Progress Notes (Signed)
Virtual Visit via Telephone Note   This visit type was conducted due to national recommendations for restrictions regarding the COVID-19 Pandemic (e.g. social distancing) in an effort to limit this patient's exposure and mitigate transmission in our community.  Due to her co-morbid illnesses, this patient is at least at moderate risk for complications without adequate follow up.  This format is felt to be most appropriate for this patient at this time.  The patient did not have access to video technology/had technical difficulties with video requiring transitioning to audio format only (telephone).  All issues noted in this document were discussed and addressed.  No physical exam could be performed with this format.  Please refer to the patient's chart for her  consent to telehealth for [facility]   Date:  02/12/2019   ID:  [name] [name], DOB 11-14-1975, MRN [id number]  Patient Location: Home Provider Location: Office  PCP:  Rosita Fire, MD  Cardiologist:  Dr Carlyle Dolly MD Electrophysiologist:  None   Evaluation Performed:  Follow-Up Visit  Chief Complaint:  Follow up visit  History of Present Illness:    [name] [name] is a 44 y.o. female with seen today for follow up of the following medical problems.   1. History of pulmonary embolism/Pulmonary HTN - history of unprovoked saddle PE in 04/2013. Followed by hematology, plans for lifelong anticoagulation. Has been on coumadin. From [facility] hematology notes did not recommend a DOAC, stating have not been well studied in patients over 250 lbs.  - 04/2013 LVEF [id number], grade I diastolic dysfunction. Flattening of interventricular septum with mild RV dysfunction, PASP 53.  -repeat CT did show near complete lysis of prior extensive PE.   - 08/2017 echo LVEF 60-65%, no WMAs, normal RV - no recent bleeding on coumadin    2. HTN - she is compliant with meds  3. Anemia - followed by hematology - on iron infusions.    The  patient does not have symptoms concerning for COVID-19 infection (fever, chills, cough, or new shortness of breath).    Past Medical History:  Diagnosis Date  . Abdominal cramps 08/30/2015  . Anemia   . Blood transfusion without reported diagnosis   . Closed left ankle fracture    August 30 2012  . Clotting disorder ([place])   . DVT (deep venous thrombosis) ([facility])    L leg  . Fibroid tumor   . Hidradenitis   . High blood pressure   . Hypertension   . Obesity   . OSA on CPAP   . Oxygen deficiency   . Pregnancy    09/20/14 17 weeks  . Pulmonary embolism ([place]) 04/2013  . Sleep apnea   . Tobacco use disorder    Past Surgical History:  Procedure Laterality Date  . ABSCESS DRAINAGE Bilateral 01/10/15  . COLONOSCOPY WITH PROPOFOL N/A 06/08/2018   Procedure: COLONOSCOPY WITH PROPOFOL;  Surgeon: Daneil Dolin, MD;  Location: [facility];  Service: Endoscopy;  Laterality: N/A;  2:30pm  . CYST REMOVAL TRUNK    . IVC FILTER PLACEMENT ([facility] HX)       Current Meds  Medication Sig  . amLODipine (NORVASC) 10 MG tablet Take 1 tablet (10 mg total) by mouth daily.  . DULoxetine (CYMBALTA) 60 MG capsule Take 1 capsule (60 mg total) by mouth daily.  . ergocalciferol (VITAMIN D2) 1.25 MG (50000 UT) capsule Take 1 capsule (50,000 Units total) by mouth once a week.  Marland Kitchen lisinopril (PRINIVIL,ZESTRIL) 40 MG tablet  Take 1 tablet (40 mg total) by mouth daily.  Marland Kitchen oxyCODONE-acetaminophen (PERCOCET) 10-325 MG tablet Take 1 tablet by mouth 2 (two) times daily as needed for pain. For chronic pain  . triamterene-hydrochlorothiazide (MAXZIDE-25) 37.5-25 MG tablet TAKE 1 TABLET BY MOUTH ONCE A DAY.  Marland Kitchen warfarin (COUMADIN) 10 MG tablet Take 1 to 1.5 tablets daily as directed by Coumadin Clinic     Allergies:   Sulfa antibiotics   Social History   Tobacco Use  . Smoking status: Current Some Day Smoker    Packs/day: 0.25    Years: 18.00    Pack years: 4.50    Types: Cigarettes  . Smokeless tobacco: Never  Used  . Tobacco comment: smokes 3 cig when she smokes  Substance Use Topics  . Alcohol use: Yes    Alcohol/week: 0.0 standard drinks    Comment: twice a month  . Drug use: No     Family Hx: The patient's family history includes Cancer in her maternal grandmother; Diabetes in her paternal aunt and paternal uncle; Heart attack in her paternal grandmother; Hypertension in her mother; Other in her father. There is no history of Colon cancer.  ROS:   Please see the history of present illness.     All other systems reviewed and are negative.   Prior CV studies:   The following studies were reviewed today:  04/2013 echo Study Conclusions  - Study data: Technically difficult study. - Procedure narrative: Transthoracic echocardiography. Image quality was adequate. The study was technically difficult, as a result of body habitus. - Left ventricle: The cavity size was normal. Wall thickness was increased in a pattern of moderate LVH. Systolic function was vigorous. The estimated ejection fraction was in the range of 65% to 70%. Wall motion was normal; there were no regional wall motion abnormalities. Doppler parameters are consistent with abnormal left ventricular relaxation (grade 1 diastolic dysfunction). - Aortic valve: Valve area: 2.01cm^2(VTI). Valve area: 2.11cm^2 (Vmax). - Right ventricle: There is systolic flattening of the interventricular septum consistent with RV pressure overload. The cavity size was severely dilated. Systolic function was mildly reduced. - Pulmonary arteries: Systolic pressure was moderately increased. PA peak pressure: 20mm Hg (S).   08/2017 echo Study Conclusions  - Left ventricle: The cavity size was normal. Wall thickness was   increased in a pattern of mild LVH. Systolic function was normal.   The estimated ejection fraction was in the range of 60% to 65%.   Wall motion was normal; there were no regional wall motion    abnormalities. Left ventricular diastolic function parameters   were normal.    Labs/Other Tests and Data Reviewed:    EKG:  No ECG reviewed.  Recent Labs: 12/15/2018: ALT 13; BUN 13; Creatinine, Ser 0.73; Hemoglobin 11.1; Platelets 341; Potassium 4.7; Sodium 136   Recent Lipid Panel Lab Results  Component Value Date/Time   CHOL 159 01/31/2017 11:19 AM   TRIG 73 01/31/2017 11:19 AM   HDL 51 01/31/2017 11:19 AM   CHOLHDL 3.1 01/31/2017 11:19 AM   LDLCALC 92 01/31/2017 11:19 AM    Wt Readings from Last 3 Encounters:  02/12/19 (!) 362 lb (164.2 kg)  07/09/18 (!) 360 lb (163.3 kg)  06/08/18 (!) 362 lb (164.2 kg)     Objective:    Vital Signs:  BP 127/78   Ht 5\' 10"  (1.778 m)   Wt (!) 362 lb (164.2 kg)   BMI 51.94 kg/m    Normal affect. Normal speech pattern and  tone. Comfortable, no apparent distress. No audible signs of SOB or wheezing.   ASSESSMENT & PLAN:    1. History of pulmonary embolism/Pulmonary HTN - committed to lifelong anticoag, hematology has recommended coumadin over DOACs for her - needs f/u in coumadin clinic, we will arrange - continue current meds   2. HTN - she is at goal, continue currnet meds  COVID-19 Education: The signs and symptoms of COVID-19 were discussed with the patient and how to seek care for testing (follow up with PCP or arrange E-visit).  The importance of social distancing was discussed today.  Time:   Today, I have spent 13 minutes with the patient with telehealth technology discussing the above problems.     Medication Adjustments/Labs and Tests Ordered: Current medicines are reviewed at length with the patient today.  Concerns regarding medicines are outlined above.   Tests Ordered: No orders of the defined types were placed in this encounter.   Medication Changes: No orders of the defined types were placed in this encounter.   Follow Up:  Either In Person or Virtual in 1 year(s)  Signed, Carlyle Dolly, MD    02/12/2019 8:35 AM    [facility]

## 2019-02-12 NOTE — Patient Instructions (Signed)
Medication Instructions:  Your physician recommends that you continue on your current medications as directed. Please refer to the Current Medication list given to you today.   Labwork: I WILL REQUEST LABS FROM PCP  Testing/Procedures: NONE  Follow-Up: Your physician recommends that you schedule a follow-up appointment in: [place] physician wants you to follow-up in: 1 YEAR. You will receive a reminder letter in the mail two months in advance. If you don't receive a letter, please call our office to schedule the follow-up appointment.   Any Other Special Instructions Will Be Listed Below (If Applicable).     If you need a refill on your cardiac medications before your next appointment, please call your pharmacy.

## 2019-02-12 NOTE — Telephone Encounter (Signed)

## 2019-02-17 ENCOUNTER — Encounter: Payer: Self-pay | Admitting: Student in an Organized Health Care Education/Training Program

## 2019-02-22 ENCOUNTER — Ambulatory Visit
Payer: Medicare Other | Attending: Student in an Organized Health Care Education/Training Program | Admitting: Student in an Organized Health Care Education/Training Program

## 2019-02-22 ENCOUNTER — Encounter: Payer: Self-pay | Admitting: Student in an Organized Health Care Education/Training Program

## 2019-02-22 ENCOUNTER — Ambulatory Visit (INDEPENDENT_AMBULATORY_CARE_PROVIDER_SITE_OTHER): Payer: Medicare Other | Admitting: *Deleted

## 2019-02-22 ENCOUNTER — Other Ambulatory Visit: Payer: Self-pay

## 2019-02-22 DIAGNOSIS — G894 Chronic pain syndrome: Secondary | ICD-10-CM

## 2019-02-22 DIAGNOSIS — G8929 Other chronic pain: Secondary | ICD-10-CM

## 2019-02-22 DIAGNOSIS — I2602 Saddle embolus of pulmonary artery with acute cor pulmonale: Secondary | ICD-10-CM | POA: Diagnosis not present

## 2019-02-22 DIAGNOSIS — M25572 Pain in left ankle and joints of left foot: Secondary | ICD-10-CM

## 2019-02-22 DIAGNOSIS — M48061 Spinal stenosis, lumbar region without neurogenic claudication: Secondary | ICD-10-CM

## 2019-02-22 DIAGNOSIS — Z86711 Personal history of pulmonary embolism: Secondary | ICD-10-CM | POA: Diagnosis not present

## 2019-02-22 DIAGNOSIS — M47816 Spondylosis without myelopathy or radiculopathy, lumbar region: Secondary | ICD-10-CM

## 2019-02-22 DIAGNOSIS — Z5181 Encounter for therapeutic drug level monitoring: Secondary | ICD-10-CM | POA: Diagnosis not present

## 2019-02-22 DIAGNOSIS — M5441 Lumbago with sciatica, right side: Secondary | ICD-10-CM | POA: Diagnosis not present

## 2019-02-22 DIAGNOSIS — M5136 Other intervertebral disc degeneration, lumbar region: Secondary | ICD-10-CM

## 2019-02-22 DIAGNOSIS — M5442 Lumbago with sciatica, left side: Secondary | ICD-10-CM | POA: Diagnosis not present

## 2019-02-22 DIAGNOSIS — Z79891 Long term (current) use of opiate analgesic: Secondary | ICD-10-CM

## 2019-02-22 LAB — POCT INR: INR: 2 (ref 2.0–3.0)

## 2019-02-22 MED ORDER — OXYCODONE-ACETAMINOPHEN 10-325 MG PO TABS: 1.0000 | ORAL_TABLET | Freq: Two times a day (BID) | ORAL | 0 refills | Status: AC | PRN

## 2019-02-22 MED ORDER — OXYCODONE-ACETAMINOPHEN 10-325 MG PO TABS: 1.0000 | ORAL_TABLET | Freq: Two times a day (BID) | ORAL | 0 refills | Status: DC | PRN

## 2019-02-22 MED ORDER — DULOXETINE HCL 60 MG PO CPEP: 60.0000 mg | ORAL_CAPSULE | Freq: Every day | ORAL | 2 refills | Status: DC

## 2019-02-22 NOTE — Patient Instructions (Signed)
Continue warfarin 1 tablet daily except 1 1/2 tablets on Tuesdays Recheck in 6 wks 

## 2019-02-22 NOTE — Progress Notes (Signed)
Patient: [name] [name]  Service Category: E/M  Provider: Gillis Santa, MD  DOB: 1975-12-31  DOS: 02/22/2019  Location: Office  MRN: [id number]  Setting: Ambulatory outpatient  Referring Provider: Rosita Fire, MD  Type: Established Patient  Specialty: Interventional Pain Management  PCP: Rosita Fire, MD  Location: Home  Delivery: TeleHealth     Virtual Encounter - Pain Management PROVIDER NOTE: Information contained herein reflects review and annotations entered in association with encounter. Interpretation of such information and data should be left to medically-trained personnel. Information provided to patient can be located elsewhere in the medical record under "Patient Instructions". Document created using STT-dictation technology, any transcriptional errors that may result from process are unintentional.    Contact & Pharmacy Preferred: [phone] Home: [phone] (home) Mobile: [phone] (mobile) E-mail: [email or web address]  [facility], [place] [address] Phone: [phone] Fax: [phone]  [facility] [address] [place] [address] [place] [address] Phone: [phone] Fax: [phone]   Pre-screening  [name] [name] offered "in-person" vs "virtual" encounter. She indicated preferring virtual for this encounter.   Reason COVID-19*  Social distancing based on CDC and AMA recommendations.   I contacted [name] [name] on 02/22/2019 via video conference.      I clearly identified myself as Gillis Santa, MD. I verified that I was speaking with the correct person using two identifiers (Name: [name] [name], and date of birth: 1976/01/09).  Consent I sought verbal advanced consent from [name] [name] for virtual visit interactions. I informed [name] [name] of possible security and privacy concerns, risks, and limitations associated with providing "not-in-person" medical  evaluation and management services. I also informed [name] [name] of the availability of "in-person" appointments. Finally, I informed her that there would be a charge for the virtual visit and that she could be  personally, fully or partially, financially responsible for it. Ms. Battey expressed understanding and agreed to proceed.   Historic Elements   [name] [name] is a 44 y.o. year old, female patient evaluated today after her last encounter by our practice on 12/14/2018. [name] [name]  has a past medical history of Abdominal cramps (08/30/2015), Anemia, Blood transfusion without reported diagnosis, Closed left ankle fracture, Clotting disorder ([place]), DVT (deep venous thrombosis) ([place]), Fibroid tumor, Hidradenitis, High blood pressure, Hypertension, Obesity, OSA on CPAP, Oxygen deficiency, Pregnancy, Pulmonary embolism ([place]) (04/2013), Sleep apnea, and Tobacco use disorder. She also  has a past surgical history that includes Cyst removal trunk; Abscess drainage (Bilateral, 01/10/15); IVC FILTER PLACEMENT ([place] HX); and Colonoscopy with propofol (N/A, 06/08/2018). [name] [name] has a current medication list which includes the following prescription(s): amlodipine, duloxetine, ergocalciferol, lisinopril, oxycodone-acetaminophen, [START ON 03/24/2019] oxycodone-acetaminophen, [START ON 04/23/2019] oxycodone-acetaminophen, triamterene-hydrochlorothiazide, and warfarin, and the following Facility-Administered Medications: sodium chloride. She  reports that she has been smoking cigarettes. She has a 4.50 pack-year smoking history. She has never used smokeless tobacco. She reports current alcohol use. She reports that she does not use drugs. [name] [name] is allergic to sulfa antibiotics.   HPI  Today, she is being contacted for medication management.   No change in medical history since last visit.  Patient's pain is at baseline.  Patient continues multimodal pain regimen as prescribed.  States that it provides pain relief and  improvement in functional status.   Pharmacotherapy Assessment  Analgesic: 01/21/2019  1   12/14/2018  Oxycodone-Acetaminophen 10-325  60.00  30  Bi Lat   [id number]   Car (9744)   0  30.00 MME  Comm Ins   [place]     Monitoring: Pharmacotherapy: No side-effects or adverse reactions reported. [place] PMP: PDMP reviewed during this encounter.       Compliance: No problems identified. Effectiveness: Clinically acceptable. Plan: Refer to "POC".  UDS:  Summary  Date Value Ref Range Status  10/13/2018 Note  Final    Comment:    ==================================================================== [employer]ox[employer] Select 13 (MW) ==================================================================== Test                             Result       Flag       Units Drug Present   Oxycodone                      651                     ng/mg creat   Noroxycodone                   935                     ng/mg creat    Sources of oxycodone include scheduled prescription medications.    Noroxycodone is an expected metabolite of oxycodone. ==================================================================== Test                      Result    Flag   Units      Ref Range   Creatinine              43               mg/dL      >=20 ==================================================================== Declared Medications:  Medication list was not provided. ==================================================================== For clinical consultation, please call [phone]. ====================================================================    Laboratory Chemistry Profile (12 mo)  Renal: 12/15/2018: BUN 13; Creatinine, Ser 0.73  Lab Results  Component Value Date   GFRAA >60 12/15/2018   GFRNONAA >60 12/15/2018   Hepatic: 12/15/2018: Albumin 3.3 Lab Results  Component Value Date   AST 12 (L) 12/15/2018   ALT 13 12/15/2018   Other: 12/15/2018: Vit D, 25-Hydroxy 9.45; Vitamin B-12 203  Note: Above Lab  results reviewed.  Imaging  [place] LT BREAST BX W LOC DEV 1ST LESION IMG BX SPEC US GUIDE Addendum: ADDENDUM REPORT: 04/15/2018 14:21   ADDENDUM:  Pathology of the LEFT breast biopsy revealed PSEUDOANGIOMATOUS  STROMAL HYPERPLASIA ([place]). FIBROCYSTIC CHANGES. THERE IS NO  EVIDENCE OF MALIGNANCY.   This was found to be concordant by Dr. Jimmye Norman.   Recommendation: Six month follow-up diagnostic mammogram and  possible ultrasound LEFT breast.   At the patient's request, results and recommendations were relayed  to the patient by phone by Jetta Lout, [place] on 04/15/2018. The  patient stated she did well following the biopsy with no bleeding or  pain. Post biopsy instructions were reviewed with the patient and  all of her questions were answered. She was encouraged to contact  the imaging department of [facility] with any further  questions or concerns.   Addendum by Jetta Lout, RRA on 04/15/2018.   Electronically Signed    By: Dorise Bullion III M.D    On: 04/15/2018 14:21 Narrative: CLINICAL DATA:  Biopsy of left breast  EXAM: ULTRASOUND GUIDED LEFT BREAST CORE  NEEDLE BIOPSY  COMPARISON:  Previous exam(s).  FINDINGS: I met with the patient and we discussed the procedure of ultrasound-guided biopsy, including benefits and alternatives. We discussed the high likelihood of a successful procedure. We discussed the risks of the procedure, including infection, bleeding, tissue injury, clip migration, and inadequate sampling. Informed written consent was given. The usual time-out protocol was performed immediately prior to the procedure.  Lesion quadrant: Lower  Using sterile technique and 1% Lidocaine as local anesthetic, under direct ultrasound visualization, a 12 gauge spring-loaded device was used to perform biopsy of a left breast mass at 3:30 using a lateral approach. At the conclusion of the procedure a tissue marker clip was deployed into the biopsy cavity.  Follow up 2 view mammogram was performed and dictated separately.  IMPRESSION: Ultrasound guided biopsy of a left breast mass 330. No apparent complications.  Electronically Signed: By: Dorise Bullion III M.D On: 04/14/2018 08:52   Assessment  The primary encounter diagnosis was Lumbar spondylosis. Diagnoses of Chronic pain syndrome, Chronic bilateral low back pain with bilateral sciatica, Lumbar degenerative disc disease, Lumbar foraminal stenosis, Chronic pain of left ankle, Morbid obesity ([place]), Long term current use of opiate analgesic, Lumbar facet arthropathy, and History of pulmonary embolism were also pertinent to this visit.  Plan of Care  I am having [name] [name] start on oxyCODONE-acetaminophen and oxyCODONE-acetaminophen. I am also having her maintain her amLODipine, lisinopril, triamterene-hydrochlorothiazide, warfarin, ergocalciferol, oxyCODONE-acetaminophen, and DULoxetine.  Pharmacotherapy (Medications Ordered): Meds ordered this encounter  Medications  . oxyCODONE-acetaminophen (PERCOCET) 10-325 MG tablet    Sig: Take 1 tablet by mouth 2 (two) times daily as needed for pain. For chronic pain    Dispense:  60 tablet    Refill:  0    Do not place this medication, or any other prescription from our practice, on "Automatic Refill". Patient may have prescription filled one day early if pharmacy is closed on scheduled refill date.  . DULoxetine (CYMBALTA) 60 MG capsule    Sig: Take 1 capsule (60 mg total) by mouth daily.    Dispense:  30 capsule    Refill:  2  . oxyCODONE-acetaminophen (PERCOCET) 10-325 MG tablet    Sig: Take 1 tablet by mouth 2 (two) times daily as needed for pain. For chronic pain    Dispense:  60 tablet    Refill:  0    Do not place this medication, or any other prescription from our practice, on "Automatic Refill". Patient may have prescription filled one day early if pharmacy is closed on scheduled refill date.  Marland Kitchen oxyCODONE-acetaminophen  (PERCOCET) 10-325 MG tablet    Sig: Take 1 tablet by mouth 2 (two) times daily as needed for pain. For chronic pain    Dispense:  60 tablet    Refill:  0    Do not place this medication, or any other prescription from our practice, on "Automatic Refill". Patient may have prescription filled one day early if pharmacy is closed on scheduled refill date.   Orders:  No orders of the defined types were placed in this encounter.  Follow-up plan:   Return in about 3 months (around 05/22/2019) for Medication Management.    Recent Visits Date Type Provider Dept  12/14/18 Office Visit Gillis Santa, MD [facility]  Showing recent visits within past 90 days and meeting all other requirements   Today's Visits Date Type Provider Dept  02/22/19 Office Visit Gillis Santa, MD [facility]  Showing today's  visits and meeting all other requirements   Future Appointments No visits were found meeting these conditions.  Showing future appointments within next 90 days and meeting all other requirements   I discussed the assessment and treatment plan with the patient. The patient was provided an opportunity to ask questions and all were answered. The patient agreed with the plan and demonstrated an understanding of the instructions.  Patient advised to call back or seek an in-person evaluation if the symptoms or condition worsens.  Duration of encounter: 30 minutes.  Note by: Gillis Santa, MD Date: 02/22/2019; Time: 1:49 PM

## 2019-04-13 ENCOUNTER — Inpatient Hospital Stay (HOSPITAL_COMMUNITY): Payer: Medicare Other | Attending: Hematology

## 2019-04-13 ENCOUNTER — Other Ambulatory Visit: Payer: Self-pay

## 2019-04-13 DIAGNOSIS — D500 Iron deficiency anemia secondary to blood loss (chronic): Secondary | ICD-10-CM

## 2019-04-13 DIAGNOSIS — N920 Excessive and frequent menstruation with regular cycle: Secondary | ICD-10-CM | POA: Insufficient documentation

## 2019-04-13 DIAGNOSIS — D509 Iron deficiency anemia, unspecified: Secondary | ICD-10-CM | POA: Diagnosis not present

## 2019-04-13 LAB — COMPREHENSIVE METABOLIC PANEL
ALT: 16 U/L (ref 0–44)
AST: 18 U/L (ref 15–41)
Albumin: 3.5 g/dL (ref 3.5–5.0)
Alkaline Phosphatase: 53 U/L (ref 38–126)
Anion gap: 8 (ref 5–15)
BUN: 11 mg/dL (ref 6–20)
CO2: 25 mmol/L (ref 22–32)
Calcium: 8.7 mg/dL — ABNORMAL LOW (ref 8.9–10.3)
Chloride: 104 mmol/L (ref 98–111)
Creatinine, Ser: 0.81 mg/dL (ref 0.44–1.00)
GFR calc Af Amer: >60 mL/min (ref >60)
GFR calc non Af Amer: >60 mL/min (ref >60)
Glucose, Bld: 93 mg/dL (ref 70–99)
Potassium: 4.6 mmol/L (ref 3.5–5.1)
Sodium: 137 mmol/L (ref 135–145)
Total Bilirubin: 0.2 mg/dL — ABNORMAL LOW (ref 0.3–1.2)
Total Protein: 8.1 g/dL (ref 6.5–8.1)

## 2019-04-13 LAB — CBC WITH DIFFERENTIAL/PLATELET
Abs Immature Granulocytes: 0.01 10*3/uL (ref 0.00–0.07)
Basophils Absolute: 0.1 10*3/uL (ref 0.0–0.1)
Basophils Relative: 1 %
Eosinophils Absolute: 0.2 10*3/uL (ref 0.0–0.5)
Eosinophils Relative: 4 %
HCT: 37.7 % (ref 36.0–46.0)
Hemoglobin: 11.8 g/dL — ABNORMAL LOW (ref 12.0–15.0)
Immature Granulocytes: 0 %
Lymphocytes Relative: 32 %
Lymphs Abs: 1.6 10*3/uL (ref 0.7–4.0)
MCH: 28.3 pg (ref 26.0–34.0)
MCHC: 31.3 g/dL (ref 30.0–36.0)
MCV: 90.4 fL (ref 80.0–100.0)
Monocytes Absolute: 0.3 10*3/uL (ref 0.1–1.0)
Monocytes Relative: 7 %
Neutro Abs: 2.7 10*3/uL (ref 1.7–7.7)
Neutrophils Relative %: 56 %
Platelets: 413 10*3/uL — ABNORMAL HIGH (ref 150–400)
RBC: 4.17 MIL/uL (ref 3.87–5.11)
RDW: 16.3 % — ABNORMAL HIGH (ref 11.5–15.5)
WBC: 4.9 10*3/uL (ref 4.0–10.5)
nRBC: 0 % (ref 0.0–0.2)

## 2019-04-13 LAB — IRON AND TIBC
Iron: 15 ug/dL — ABNORMAL LOW (ref 28–170)
Saturation Ratios: 4 % — ABNORMAL LOW (ref 10.4–31.8)
TIBC: 426 ug/dL (ref 250–450)
UIBC: 411 ug/dL

## 2019-04-13 LAB — FOLATE: Folate: 10.2 ng/mL (ref >5.9)

## 2019-04-13 LAB — VITAMIN D 25 HYDROXY (VIT D DEFICIENCY, FRACTURES): Vit D, 25-Hydroxy: 52 ng/mL (ref 30–100)

## 2019-04-13 LAB — LACTATE DEHYDROGENASE: LDH: 127 U/L (ref 98–192)

## 2019-04-13 LAB — FERRITIN: Ferritin: 11 ng/mL (ref 11–307)

## 2019-04-13 LAB — VITAMIN B12: Vitamin B-12: 246 pg/mL (ref 180–914)

## 2019-04-20 ENCOUNTER — Inpatient Hospital Stay (HOSPITAL_BASED_OUTPATIENT_CLINIC_OR_DEPARTMENT_OTHER): Payer: Medicare Other | Admitting: Hematology

## 2019-04-20 ENCOUNTER — Other Ambulatory Visit: Payer: Self-pay

## 2019-04-20 ENCOUNTER — Encounter (HOSPITAL_COMMUNITY): Payer: Self-pay | Admitting: Hematology

## 2019-04-20 DIAGNOSIS — E559 Vitamin D deficiency, unspecified: Secondary | ICD-10-CM

## 2019-04-20 DIAGNOSIS — D500 Iron deficiency anemia secondary to blood loss (chronic): Secondary | ICD-10-CM | POA: Diagnosis not present

## 2019-04-20 NOTE — Progress Notes (Signed)
Virtual Visit via Telephone Note  I connected with [name] [name] on 04/20/19 at  4:05 PM EDT by telephone and verified that I am speaking with the correct person using two identifiers.   I discussed the limitations, risks, security and privacy concerns of performing an evaluation and management service by telephone and the availability of in person appointments. I also discussed with the patient that there may be a patient responsible charge related to this service. The patient expressed understanding and agreed to proceed.   History of Present Illness: She is seen in our clinic for unprovoked pulmonary embolism and is on Coumadin.  This was diagnosed in 2015.  She also has iron deficiency anemia secondary to excessive menstrual bleeding.  She also has vitamin D deficiency.   Observations/Objective: Reports that she has not had any energy improvement after her last iron infusion in December 2020.  Reports that her menses have been heavy, 7 days/ every 28 days.  Denies any fevers, night sweats or weight loss.  Numbness in the left leg is stable.  Sleep problems are stable.  Appetite is 100%.  Energy levels are 50%.  She is continuing to take vitamin D without any problems.  Assessment and Plan:  1.  Iron deficiency anemia: -We reviewed her recent labs.  Ferritin is 11, decreased from 17.  Hemoglobin is 11.8. -We will schedule her for Feraheme weekly x2. -We will reevaluate her in 4 months with repeat labs.  2.  Menstrual blood loss: -She reports having menses for 7 days every 28 days, heavy flow. -She has excessive bleeding being on Coumadin.  She reports that she is done with childbearing.  I have suggested her to seek out to her GYN doctor to discuss various options to minimize/stop menstrual bleeding.  3.  Pulmonary embolism: -Unprovoked bilateral pulmonary embolism diagnosed in 2015, hospitalized for right heart strain and IVC filter placement.  Follows up with [facility]. -She will continue indefinite anticoagulation.  4.  Vitamin D deficiency: -Labs on 12/15/2018 shows vitamin D was 9.4.  She was started on vitamin D 50,000 units weekly.  She is tolerating it very well.  Latest vitamin D levels were more than 50.  We will plan to repeat it in 4 months.   Follow Up Instructions: Schedule Feraheme weekly x2.  Follow-up in 4 months with labs. I discussed the assessment and treatment plan with the patient. The patient was provided an opportunity to ask questions and all were answered. The patient agreed with the plan and demonstrated an understanding of the instructions.   The patient was advised to call back or seek an in-person evaluation if the symptoms worsen or if the condition fails to improve as anticipated.  I provided 12 minutes of non-face-to-face time during this encounter.   Derek Jack, MD

## 2019-04-29 ENCOUNTER — Other Ambulatory Visit: Payer: Self-pay | Admitting: Student in an Organized Health Care Education/Training Program

## 2019-04-29 ENCOUNTER — Other Ambulatory Visit: Payer: Self-pay | Admitting: Cardiology

## 2019-04-29 DIAGNOSIS — G894 Chronic pain syndrome: Secondary | ICD-10-CM

## 2019-04-29 DIAGNOSIS — I2602 Saddle embolus of pulmonary artery with acute cor pulmonale: Secondary | ICD-10-CM

## 2019-04-30 ENCOUNTER — Ambulatory Visit (HOSPITAL_COMMUNITY): Payer: Medicare Other

## 2019-05-07 ENCOUNTER — Encounter (HOSPITAL_COMMUNITY): Payer: Self-pay

## 2019-05-07 ENCOUNTER — Inpatient Hospital Stay (HOSPITAL_COMMUNITY): Payer: Medicare Other | Attending: Hematology

## 2019-05-07 ENCOUNTER — Other Ambulatory Visit: Payer: Self-pay

## 2019-05-07 VITALS — BP 140/93 | HR 77 | Temp 97.5°F | Resp 18

## 2019-05-07 DIAGNOSIS — D509 Iron deficiency anemia, unspecified: Secondary | ICD-10-CM | POA: Insufficient documentation

## 2019-05-07 DIAGNOSIS — D500 Iron deficiency anemia secondary to blood loss (chronic): Secondary | ICD-10-CM

## 2019-05-07 MED ORDER — SODIUM CHLORIDE 0.9 % IV SOLN: Freq: Once | INTRAVENOUS | Status: AC

## 2019-05-07 MED ORDER — SODIUM CHLORIDE 0.9 % IV SOLN
510.0000 mg | Freq: Once | INTRAVENOUS | Status: AC
  Administered 2019-05-07: 510 mg via INTRAVENOUS
  Filled 2019-05-07: qty 510

## 2019-05-07 NOTE — Progress Notes (Signed)
Iron given per orders. Patient tolerated it well without problems. Vitals stable and discharged home from clinic ambulatory. Follow up as scheduled.  

## 2019-05-07 NOTE — Patient Instructions (Signed)
[facility] at [facility]  Discharge Instructions:   _______________________________________________________________  Thank you for choosing [facility] at [facility] to provide your oncology and hematology care.  To afford each patient quality time with our providers, please arrive at least 15 minutes before your scheduled appointment.  You need to re-schedule your appointment if you arrive 10 or more minutes late.  We strive to give you quality time with our providers, and arriving late affects you and other patients whose appointments are after yours.  Also, if you no show three or more times for appointments you may be dismissed from the clinic.  Again, thank you for choosing [facility] at [facility]. Our hope is that these requests will allow you access to exceptional care and in a timely manner. _______________________________________________________________  If you have questions after your visit, please contact our office at [phone] between the hours of 8:30 a.m. and 5:00 p.m. Voicemails left after 4:30 p.m. will not be returned until the following business day. _______________________________________________________________  For prescription refill requests, have your pharmacy contact our office. _______________________________________________________________  Recommendations made by the consultant and any test results will be sent to your referring physician. _______________________________________________________________ 

## 2019-05-12 ENCOUNTER — Other Ambulatory Visit: Payer: Self-pay | Admitting: Cardiology

## 2019-05-12 DIAGNOSIS — I2602 Saddle embolus of pulmonary artery with acute cor pulmonale: Secondary | ICD-10-CM

## 2019-05-14 ENCOUNTER — Ambulatory Visit (HOSPITAL_COMMUNITY): Payer: Medicare Other

## 2019-05-17 ENCOUNTER — Encounter: Payer: Self-pay | Admitting: Student in an Organized Health Care Education/Training Program

## 2019-05-18 ENCOUNTER — Ambulatory Visit
Payer: Medicare Other | Attending: Student in an Organized Health Care Education/Training Program | Admitting: Student in an Organized Health Care Education/Training Program

## 2019-05-18 ENCOUNTER — Telehealth: Payer: Self-pay | Admitting: *Deleted

## 2019-05-18 ENCOUNTER — Encounter: Payer: Self-pay | Admitting: Student in an Organized Health Care Education/Training Program

## 2019-05-18 ENCOUNTER — Other Ambulatory Visit: Payer: Self-pay

## 2019-05-18 DIAGNOSIS — G8929 Other chronic pain: Secondary | ICD-10-CM

## 2019-05-18 DIAGNOSIS — M5442 Lumbago with sciatica, left side: Secondary | ICD-10-CM

## 2019-05-18 DIAGNOSIS — M5136 Other intervertebral disc degeneration, lumbar region: Secondary | ICD-10-CM

## 2019-05-18 DIAGNOSIS — G894 Chronic pain syndrome: Secondary | ICD-10-CM

## 2019-05-18 DIAGNOSIS — M25572 Pain in left ankle and joints of left foot: Secondary | ICD-10-CM

## 2019-05-18 DIAGNOSIS — M47816 Spondylosis without myelopathy or radiculopathy, lumbar region: Secondary | ICD-10-CM | POA: Diagnosis not present

## 2019-05-18 DIAGNOSIS — M48061 Spinal stenosis, lumbar region without neurogenic claudication: Secondary | ICD-10-CM

## 2019-05-18 DIAGNOSIS — M5441 Lumbago with sciatica, right side: Secondary | ICD-10-CM

## 2019-05-18 MED ORDER — OXYCODONE-ACETAMINOPHEN 10-325 MG PO TABS: 1.0000 | ORAL_TABLET | Freq: Two times a day (BID) | ORAL | 0 refills | Status: AC | PRN

## 2019-05-18 NOTE — Progress Notes (Signed)
Patient: [name] [name]  Service Category: E/M  Provider: Gillis Santa, MD  DOB: 1975/08/24  DOS: 05/18/2019  Location: Office  MRN: [id number]  Setting: Ambulatory outpatient  Referring Provider: Rosita Fire, MD  Type: Established Patient  Specialty: Interventional Pain Management  PCP: Rosita Fire, MD  Location: Home  Delivery: TeleHealth     Virtual Encounter - Pain Management PROVIDER NOTE: Information contained herein reflects review and annotations entered in association with encounter. Interpretation of such information and data should be left to medically-trained personnel. Information provided to patient can be located elsewhere in the medical record under "Patient Instructions". Document created using STT-dictation technology, any transcriptional errors that may result from process are unintentional.    Contact & Pharmacy Preferred: [phone] Home: [phone] (home) Mobile: [phone] (mobile) E-mail: [email or web address]  [place] [place] [address] Phone: [phone] Fax: [phone]  [place] [address] [place] [address] [place] [address] Phone: [phone] Fax: [phone]   Pre-screening  [name] [name] offered "in-person" vs "virtual" encounter. She indicated preferring virtual for this encounter.   Reason COVID-19*  Social distancing based on CDC and AMA recommendations.   I contacted [name] [name] on 05/18/2019 via video conference.      I clearly identified myself as Gillis Santa, MD. I verified that I was speaking with the correct person using two identifiers (Name: [name] [name], and date of birth: 04-19-1975).  Consent I sought verbal advanced consent from [name] [name] for virtual visit interactions. I informed [name] [name] of possible security and privacy concerns, risks, and limitations associated with providing "not-in-person" medical  evaluation and management services. I also informed [name] [name] of the availability of "in-person" appointments. Finally, I informed her that there would be a charge for the virtual visit and that she could be  personally, fully or partially, financially responsible for it. [name] expressed understanding and agreed to proceed.   Historic Elements   [name] [name] is a 44 y.o. year old, female patient evaluated today after her last contact with our practice on 04/29/2019. [name] [name]  has a past medical history of Abdominal cramps (08/30/2015), Anemia, Blood transfusion without reported diagnosis, Closed left ankle fracture, Clotting disorder ([place]), DVT (deep venous thrombosis) ([place]), Fibroid tumor, Hidradenitis, High blood pressure, Hypertension, Obesity, OSA on CPAP, Oxygen deficiency, Pregnancy, Pulmonary embolism ([place]) (04/2013), Sleep apnea, and Tobacco use disorder. She also  has a past surgical history that includes Cyst removal trunk; Abscess drainage (Bilateral, 01/10/15); IVC FILTER PLACEMENT ([place] HX); and Colonoscopy with propofol (N/A, 06/08/2018). [name] [name] has a current medication list which includes the following prescription(s): amlodipine, duloxetine, ergocalciferol, lisinopril, [START ON 05/22/2019] oxycodone-acetaminophen, [START ON 06/21/2019] oxycodone-acetaminophen, [START ON 07/21/2019] oxycodone-acetaminophen, triamterene-hydrochlorothiazide, and warfarin, and the following Facility-Administered Medications: sodium chloride. She  reports that she has been smoking cigarettes. She has a 4.50 pack-year smoking history. She has never used smokeless tobacco. She reports current alcohol use. She reports that she does not use drugs. [name] [name] is allergic to sulfa antibiotics.   HPI  Today, she is being contacted for medication management.   Patient overall is doing well.  She states that her pain is well managed.  She is interested in seeing a chiropractor and trying aquatic physical therapy since  now she has Medicare.  Will place referral for aquatic PT and recommend patient see chiropractor out in her area that has  good reviews.  Otherwise we will decrease her Percocet to 45/month.  We have weaned her down over the last couple of months and she has done well.  Continue weaning in the future.  Pharmacotherapy Assessment  Analgesic: 04/23/2019  1   02/22/2019  Oxycodone-Acetaminophen 10-325  60.00  30 Bi Lat   [id number]   Car (9744)   0  30.00 MME  Comm Ins   [place]    Monitoring: [place] PMP: PDMP reviewed during this encounter.       Pharmacotherapy: No side-effects or adverse reactions reported. Compliance: No problems identified. Effectiveness: Clinically acceptable. Plan: Refer to "POC".  UDS:  Summary  Date Value Ref Range Status  10/13/2018 Note  Final    Comment:    ==================================================================== [employer] Select 13 (MW) ==================================================================== Test                             Result       Flag       Units Drug Present   Oxycodone                      651                     ng/mg creat   Noroxycodone                   935                     ng/mg creat    Sources of oxycodone include scheduled prescription medications.    Noroxycodone is an expected metabolite of oxycodone. ==================================================================== Test                      Result    Flag   Units      Ref Range   Creatinine              43               mg/dL      >=20 ==================================================================== Declared Medications:  Medication list was not provided. ==================================================================== For clinical consultation, please call [phone]. ====================================================================    Laboratory Chemistry Profile   Renal Lab Results  Component Value Date   BUN 11 04/13/2019   CREATININE  0.81 04/13/2019   LABCREA 50.00 00/86/7619   BCR NOT APPLICABLE 50/93/2671   GFRAA >60 04/13/2019   GFRNONAA >60 04/13/2019     Hepatic Lab Results  Component Value Date   AST 18 04/13/2019   ALT 16 04/13/2019   ALBUMIN 3.5 04/13/2019   ALKPHOS 53 04/13/2019     Electrolytes Lab Results  Component Value Date   NA 137 04/13/2019   K 4.6 04/13/2019   CL 104 04/13/2019   CALCIUM 8.7 (L) 04/13/2019     Bone Lab Results  Component Value Date   VD25OH 52.00 04/13/2019     Inflammation (CRP: Acute Phase) (ESR: Chronic Phase) Lab Results  Component Value Date   ESRSEDRATE 25 (H) 10/28/2013   LATICACIDVEN 6.2 (H) 04/27/2013       Note: Above Lab results reviewed.  Imaging  [place] LT BREAST BX W LOC DEV 1ST LESION IMG BX SPEC US GUIDE Addendum: ADDENDUM REPORT: 04/15/2018 14:21   ADDENDUM:  Pathology of the LEFT breast biopsy revealed PSEUDOANGIOMATOUS  STROMAL HYPERPLASIA ([place]). FIBROCYSTIC CHANGES. THERE IS NO  EVIDENCE OF MALIGNANCY.  This was found to be concordant by Dr. Jimmye Norman.   Recommendation: Six month follow-up diagnostic mammogram and  possible ultrasound LEFT breast.   At the patient's request, results and recommendations were relayed  to the patient by phone by [name] [name], [place] on 04/15/2018. The  patient stated she did well following the biopsy with no bleeding or  pain. Post biopsy instructions were reviewed with the patient and  all of her questions were answered. She was encouraged to contact  the imaging department of [facility] with any further  questions or concerns.   Addendum by [name] [name], RRA on 04/15/2018.   Electronically Signed    By: Dorise Bullion III M.D    On: 04/15/2018 14:21 Narrative: CLINICAL DATA:  Biopsy of left breast  EXAM: ULTRASOUND GUIDED LEFT BREAST CORE NEEDLE BIOPSY  COMPARISON:  Previous exam(s).  FINDINGS: I met with the patient and we discussed the procedure of ultrasound-guided biopsy,  including benefits and alternatives. We discussed the high likelihood of a successful procedure. We discussed the risks of the procedure, including infection, bleeding, tissue injury, clip migration, and inadequate sampling. Informed written consent was given. The usual time-out protocol was performed immediately prior to the procedure.  Lesion quadrant: Lower  Using sterile technique and 1% Lidocaine as local anesthetic, under direct ultrasound visualization, a 12 gauge spring-loaded device was used to perform biopsy of a left breast mass at 3:30 using a lateral approach. At the conclusion of the procedure a tissue marker clip was deployed into the biopsy cavity. Follow up 2 view mammogram was performed and dictated separately.  IMPRESSION: Ultrasound guided biopsy of a left breast mass 330. No apparent complications.  Electronically Signed: By: Dorise Bullion III M.D On: 04/14/2018 08:52  Assessment  The primary encounter diagnosis was Chronic pain syndrome. Diagnoses of Lumbar spondylosis, Chronic bilateral low back pain with bilateral sciatica, Lumbar degenerative disc disease, Lumbar foraminal stenosis, Chronic pain of left ankle, and Morbid obesity ([place]) were also pertinent to this visit.  Plan of Care  [name] [name] has a current medication list which includes the following long-term medication(s): amlodipine, duloxetine, lisinopril, triamterene-hydrochlorothiazide, and warfarin.  Pharmacotherapy (Medications Ordered): Meds ordered this encounter  Medications  . oxyCODONE-acetaminophen (PERCOCET) 10-325 MG tablet    Sig: Take 1 tablet by mouth 2 (two) times daily as needed for pain. For chronic pain syndrome.  Max 45 tablets/month.  Must last 30 days.    Dispense:  45 tablet    Refill:  0  . oxyCODONE-acetaminophen (PERCOCET) 10-325 MG tablet    Sig: Take 1 tablet by mouth 2 (two) times daily as needed for pain. For chronic pain syndrome.  Max 45 tablets/month.   Must last 30 days.    Dispense:  45 tablet    Refill:  0  . oxyCODONE-acetaminophen (PERCOCET) 10-325 MG tablet    Sig: Take 1 tablet by mouth 2 (two) times daily as needed for pain. For chronic pain syndrome.  Max 45 tablets/month.  Must last 30 days.    Dispense:  45 tablet    Refill:  0   Orders:  Orders Placed This Encounter  Procedures  . Ambulatory referral to Physical Therapy    Referral Priority:   Routine    Referral Type:   Physical Medicine    Referral Reason:   Specialty Services Required    Requested Specialty:   Physical Therapy    Number of Visits Requested:   1   Follow-up plan:  Return in about 3 months (around 08/17/2019) for Medication Management, in person, (UDS).      Recent Visits Date Type Provider Dept  02/22/19 Office Visit Gillis Santa, MD [facility]  Showing recent visits within past 90 days and meeting all other requirements   Today's Visits Date Type Provider Dept  05/18/19 Telemedicine Gillis Santa, MD [facility]  Showing today's visits and meeting all other requirements   Future Appointments No visits were found meeting these conditions.  Showing future appointments within next 90 days and meeting all other requirements   I discussed the assessment and treatment plan with the patient. The patient was provided an opportunity to ask questions and all were answered. The patient agreed with the plan and demonstrated an understanding of the instructions.  Patient advised to call back or seek an in-person evaluation if the symptoms or condition worsens.  Duration of encounter: 25 minutes.  Note by: Gillis Santa, MD Date: 05/18/2019; Time: 9:36 AM

## 2019-05-18 NOTE — Telephone Encounter (Signed)
sw pt gave d/t slots for her to complete her UDS.Marland KitchenTD

## 2019-06-03 NOTE — Telephone Encounter (Signed)
ERROR

## 2019-08-11 ENCOUNTER — Inpatient Hospital Stay (HOSPITAL_COMMUNITY): Payer: Medicare Other | Attending: Hematology

## 2019-08-11 ENCOUNTER — Other Ambulatory Visit: Payer: Self-pay

## 2019-08-11 DIAGNOSIS — E559 Vitamin D deficiency, unspecified: Secondary | ICD-10-CM | POA: Diagnosis not present

## 2019-08-11 DIAGNOSIS — D509 Iron deficiency anemia, unspecified: Secondary | ICD-10-CM | POA: Diagnosis not present

## 2019-08-11 DIAGNOSIS — D500 Iron deficiency anemia secondary to blood loss (chronic): Secondary | ICD-10-CM

## 2019-08-11 DIAGNOSIS — Z7901 Long term (current) use of anticoagulants: Secondary | ICD-10-CM | POA: Insufficient documentation

## 2019-08-11 DIAGNOSIS — I2699 Other pulmonary embolism without acute cor pulmonale: Secondary | ICD-10-CM | POA: Diagnosis not present

## 2019-08-11 LAB — CBC WITH DIFFERENTIAL/PLATELET
Abs Immature Granulocytes: 0.02 10*3/uL (ref 0.00–0.07)
Basophils Absolute: 0 10*3/uL (ref 0.0–0.1)
Basophils Relative: 1 %
Eosinophils Absolute: 0.2 10*3/uL (ref 0.0–0.5)
Eosinophils Relative: 2 %
HCT: 36.4 % (ref 36.0–46.0)
Hemoglobin: 10.9 g/dL — ABNORMAL LOW (ref 12.0–15.0)
Immature Granulocytes: 0 %
Lymphocytes Relative: 23 %
Lymphs Abs: 1.7 10*3/uL (ref 0.7–4.0)
MCH: 24.3 pg — ABNORMAL LOW (ref 26.0–34.0)
MCHC: 29.9 g/dL — ABNORMAL LOW (ref 30.0–36.0)
MCV: 81.3 fL (ref 80.0–100.0)
Monocytes Absolute: 0.5 10*3/uL (ref 0.1–1.0)
Monocytes Relative: 7 %
Neutro Abs: 5 10*3/uL (ref 1.7–7.7)
Neutrophils Relative %: 67 %
Platelets: 311 10*3/uL (ref 150–400)
RBC: 4.48 MIL/uL (ref 3.87–5.11)
RDW: 18.6 % — ABNORMAL HIGH (ref 11.5–15.5)
WBC: 7.4 10*3/uL (ref 4.0–10.5)
nRBC: 0 % (ref 0.0–0.2)

## 2019-08-11 LAB — VITAMIN B12: Vitamin B-12: 313 pg/mL (ref 180–914)

## 2019-08-11 LAB — VITAMIN D 25 HYDROXY (VIT D DEFICIENCY, FRACTURES): Vit D, 25-Hydroxy: 29.4 ng/mL — ABNORMAL LOW (ref 30–100)

## 2019-08-11 LAB — FERRITIN: Ferritin: 10 ng/mL — ABNORMAL LOW (ref 11–307)

## 2019-08-12 ENCOUNTER — Other Ambulatory Visit (HOSPITAL_COMMUNITY): Payer: Medicare Other

## 2019-08-17 ENCOUNTER — Encounter: Payer: Medicare Other | Admitting: Student in an Organized Health Care Education/Training Program

## 2019-08-19 ENCOUNTER — Inpatient Hospital Stay (HOSPITAL_BASED_OUTPATIENT_CLINIC_OR_DEPARTMENT_OTHER): Payer: Medicare Other | Admitting: Hematology

## 2019-08-19 ENCOUNTER — Other Ambulatory Visit: Payer: Self-pay

## 2019-08-19 DIAGNOSIS — E559 Vitamin D deficiency, unspecified: Secondary | ICD-10-CM

## 2019-08-19 MED ORDER — ERGOCALCIFEROL 1.25 MG (50000 UT) PO CAPS: 50000.0000 [IU] | ORAL_CAPSULE | ORAL | 4 refills | Status: DC

## 2019-08-19 NOTE — Progress Notes (Signed)
Virtual Visit via Telephone Note  I connected with [name] [name] on 08/19/19 at  3:00 PM EDT by telephone and verified that I am speaking with the correct person using two identifiers.   I discussed the limitations, risks, security and privacy concerns of performing an evaluation and management service by telephone and the availability of in person appointments. I also discussed with the patient that there may be a patient responsible charge related to this service. The patient expressed understanding and agreed to proceed.   History of Present Illness: She is followed up in our clinic for iron deficiency anemia from blood loss and pulmonary embolism.  She is continuing to be on an indefinite anticoagulation with Coumadin.     Observations/Objective: She reports tiredness and regards energy levels are 25%.  Appetite is 100%.  She reports that she is having menstrual bleeding which is heavy and last for 7 days to 8 days every 28 days.  She tried to see GYN but could not get in.  She reports that she has been taking vitamin D tablets once weekly and requests refill.  Assessment and Plan:  1.  Iron deficiency anemia: -Labs on 08/11/2019 shows hemoglobin decreased to 10.9.  B12 is normal.  Ferritin is low at 10. -I have recommended weekly Feraheme x2. -We will see her back in 4 months for follow-up with repeat labs.  2.  Vitamin D deficiency: -Vitamin D level is 29.  She will continue vitamin D 50,000 units weekly.  I have sent refills.  3.  Pulmonary embolism: -Unprovoked bilateral pulmonary embolism diagnosed in 2015, hospitalized for right heart strain and IVC filter placement.  Follows up with [facility] Coumadin clinic. -She is taking 10 mg daily and her INR is in therapeutic range.  Continue indefinite anticoagulation.  4.  Menstrual blood loss: -She reports heavy menses, lasting 7 to 8 days every 28 days. -We will make a referral to Dr. Glo Herring.   Follow Up Instructions:   RTC 4  months with labs. I discussed the assessment and treatment plan with the patient. The patient was provided an opportunity to ask questions and all were answered. The patient agreed with the plan and demonstrated an understanding of the instructions.   The patient was advised to call back or seek an in-person evaluation if the symptoms worsen or if the condition fails to improve as anticipated.  I provided 11 minutes of non-face-to-face time during this encounter.   Derek Jack, MD

## 2019-08-23 ENCOUNTER — Encounter: Payer: Self-pay | Admitting: Student in an Organized Health Care Education/Training Program

## 2019-08-23 ENCOUNTER — Telehealth: Payer: Self-pay | Admitting: *Deleted

## 2019-08-23 ENCOUNTER — Other Ambulatory Visit: Payer: Self-pay

## 2019-08-23 ENCOUNTER — Ambulatory Visit
Payer: Medicare Other | Attending: Student in an Organized Health Care Education/Training Program | Admitting: Student in an Organized Health Care Education/Training Program

## 2019-08-23 VITALS — BP 163/99 | HR 87 | Temp 97.5°F | Resp 16 | Ht 71.0 in | Wt 362.0 lb

## 2019-08-23 DIAGNOSIS — M47816 Spondylosis without myelopathy or radiculopathy, lumbar region: Secondary | ICD-10-CM | POA: Diagnosis present

## 2019-08-23 DIAGNOSIS — M5136 Other intervertebral disc degeneration, lumbar region: Secondary | ICD-10-CM

## 2019-08-23 DIAGNOSIS — M48061 Spinal stenosis, lumbar region without neurogenic claudication: Secondary | ICD-10-CM | POA: Insufficient documentation

## 2019-08-23 DIAGNOSIS — G8929 Other chronic pain: Secondary | ICD-10-CM | POA: Diagnosis present

## 2019-08-23 DIAGNOSIS — M5441 Lumbago with sciatica, right side: Secondary | ICD-10-CM | POA: Diagnosis present

## 2019-08-23 DIAGNOSIS — G894 Chronic pain syndrome: Secondary | ICD-10-CM | POA: Insufficient documentation

## 2019-08-23 DIAGNOSIS — M5442 Lumbago with sciatica, left side: Secondary | ICD-10-CM | POA: Insufficient documentation

## 2019-08-23 MED ORDER — OXYCODONE-ACETAMINOPHEN 10-325 MG PO TABS: 1.0000 | ORAL_TABLET | Freq: Every day | ORAL | 0 refills | Status: AC | PRN

## 2019-08-23 MED ORDER — OXYCODONE-ACETAMINOPHEN 10-325 MG PO TABS: 1.0000 | ORAL_TABLET | Freq: Two times a day (BID) | ORAL | 0 refills | Status: AC | PRN

## 2019-08-23 NOTE — Progress Notes (Signed)
PROVIDER NOTE: Information contained herein reflects review and annotations entered in association with encounter. Interpretation of such information and data should be left to medically-trained personnel. Information provided to patient can be located elsewhere in the medical record under "Patient Instructions". Document created using STT-dictation technology, any transcriptional errors that may result from process are unintentional.    Patient: [name] [name] [name]  Service Category: E/M  Provider: Gillis Santa, MD  DOB: 1975-07-19  DOS: 08/23/2019  Specialty: Interventional Pain Management  MRN: [id number]  Setting: Ambulatory outpatient  PCP: [name] Fire, MD  Type: Established Patient    Referring Provider: Rosita Fire, MD  Location: Office  Delivery: Face-to-face     HPI  Reason for encounter: Ms. [name] [name] [name], a 44 y.o. year old female, is here today for evaluation and management of her Chronic pain syndrome [G89.4]. Ms. [name] [name] [name] primary complain today is Back Pain (lumbar bilateral right is worse) Last encounter: Practice (08/17/2019). My last encounter with her was on 08/17/2019. Pertinent problems: Ms. [name] [name] [name] has Pleuritic chest pain; Saddle embolus of pulmonary artery with acute cor pulmonale ([name] [name] [name]); Back pain with left-sided radiculopathy; Morbid obesity ([place]); Left leg DVT ([name] [name] [name]); S/P IVC filter; Chronic bilateral low back pain with bilateral sciatica; Chronic pain of left ankle; Chronic pain syndrome; Long term current use of opiate analgesic; Lumbar spondylosis; Lumbar foraminal stenosis; and Lumbar degenerative disc disease on their pertinent problem list. Pain Assessment: Severity of Chronic pain is reported as a 9 /10. Location: Back Lower, Left, Right/down left leg to the ankle. Onset: More than a month ago. Quality: Constant, Throbbing, Penetrating (piercing). Timing: Constant. Modifying factor(s): nothing currently.  medications helps if she takes them properly.. Vitals:  height is 5' 11"   (1.803 m) and weight is 362 lb (164.2 kg) (abnormal). Her temporal temperature is 97.5 F (36.4 C) (abnormal). Her blood pressure is 163/99 (abnormal) and her pulse is 87. Her respiration is 16 and oxygen saturation is 100%.   No change in medical history since last visit.  Patient's pain is at baseline.  Patient continues multimodal pain regimen as prescribed.  States that it provides pain relief and improvement in functional status. States that she had a death in the family last week which she is very sad about. States that she is open to weaning her oxycodone, will reduce to quantity 30 next month. We will obtain urine toxicology screen today.  This will be negative for oxycodone as patient ran out of this medication last week and has not had any intake of it since then.  Pharmacotherapy Assessment   Analgesic: 07/21/2019  1   05/18/2019  Oxycodone-Acetaminophen 10-325  45.00  30 Bi Lat   [id number]   Car (9744)   0/0  22.50 MME  Comm Ins   [place]      Monitoring: [name] PMP: PDMP reviewed during this encounter.       Pharmacotherapy: No side-effects or adverse reactions reported. Compliance: No problems identified. Effectiveness: Clinically acceptable.  [name] Billow, RN  08/23/2019 11:23 AM  Sign when Signing Visit Nursing Pain Medication Assessment:  Safety precautions to be maintained throughout the outpatient stay will include: orient to surroundings, keep bed in low position, maintain call bell within reach at all times, provide assistance with transfer out of bed and ambulation.  Medication Inspection Compliance: [name] [name] [name] did not comply with our request to bring her pills to be counted. She was reminded that bringing the medication bottles, even when empty, is a requirement.  Medication: None  brought in. Pill/Patch Count: None available to be counted. Bottle Appearance: No container available. Did not bring bottle(s) to appointment. Filled Date: N/A Last Medication intake:  Ran  out of medicine more than 48 hours ago    UDS:  Summary  Date Value Ref Range Status  10/13/2018 Note  Final    Comment:    ==================================================================== ToxASSURE Select 13 (MW) ==================================================================== Test                             Result       Flag       Units Drug Present   Oxycodone                      651                     ng/mg creat   Noroxycodone                   935                     ng/mg creat    Sources of oxycodone include scheduled prescription medications.    Noroxycodone is an expected metabolite of oxycodone. ==================================================================== Test                      Result    Flag   Units      Ref Range   Creatinine              43               mg/dL      >=20 ==================================================================== Declared Medications:  Medication list was not provided. ==================================================================== For clinical consultation, please call [phone]. ====================================================================      ROS  Constitutional: Denies any fever or chills Gastrointestinal: No reported hemesis, hematochezia, vomiting, or acute GI distress Musculoskeletal: Low back pain, muscle cramps Neurological: No reported episodes of acute onset apraxia, aphasia, dysarthria, agnosia, amnesia, paralysis, loss of coordination, or loss of consciousness  Medication Review  DULoxetine, amLODipine, ergocalciferol, lisinopril, oxyCODONE-acetaminophen, triamterene-hydrochlorothiazide, and warfarin  History Review  Allergy: [name] [name] [name] is allergic to sulfa antibiotics. Drug: [name] [name] [name]  reports no history of drug use. Alcohol:  reports current alcohol use. Tobacco:  reports that she has been smoking cigarettes. She has a 4.50 pack-year smoking history. She has never used smokeless  tobacco. Social: [name] [name] [name]  reports that she has been smoking cigarettes. She has a 4.50 pack-year smoking history. She has never used smokeless tobacco. She reports current alcohol use. She reports that she does not use drugs. Medical:  has a past medical history of Abdominal cramps (08/30/2015), Anemia, Blood transfusion without reported diagnosis, Closed left ankle fracture, Clotting disorder ([place]), DVT (deep venous thrombosis) ([place]), Fibroid tumor, Hidradenitis, High blood pressure, Hypertension, Obesity, OSA on CPAP, Oxygen deficiency, Pregnancy, Pulmonary embolism ([place]) (04/2013), Sleep apnea, and Tobacco use disorder. Surgical: [name] [name] [name]  has a past surgical history that includes Cyst removal trunk; Abscess drainage (Bilateral, 01/10/15); IVC FILTER PLACEMENT ([place] [facility]); and Colonoscopy with propofol (N/A, 06/08/2018). Family: family history includes Cancer in her maternal grandmother; Diabetes in her paternal aunt and paternal uncle; Heart attack in her paternal grandmother; Hypertension in her mother; Other in her father.  Laboratory Chemistry Profile   Renal Lab Results  Component Value Date   BUN 11 04/13/2019  CREATININE 0.81 04/13/2019   LABCREA 50.00 86/38/1771   BCR NOT APPLICABLE 16/57/9038   GFRAA >60 04/13/2019   GFRNONAA >60 04/13/2019     Hepatic Lab Results  Component Value Date   AST 18 04/13/2019   ALT 16 04/13/2019   ALBUMIN 3.5 04/13/2019   ALKPHOS 53 04/13/2019     Electrolytes Lab Results  Component Value Date   NA 137 04/13/2019   K 4.6 04/13/2019   CL 104 04/13/2019   CALCIUM 8.7 (L) 04/13/2019     Bone Lab Results  Component Value Date   VD25OH 29.40 (L) 08/11/2019     Inflammation (CRP: Acute Phase) (ESR: Chronic Phase) Lab Results  Component Value Date   ESRSEDRATE 25 (H) 10/28/2013   LATICACIDVEN 6.2 (H) 04/27/2013       Note: Above Lab results reviewed.  Recent Imaging Review  [place] LT BREAST BX W LOC DEV 1ST LESION IMG BX SPEC US  GUIDE Addendum: ADDENDUM REPORT: 04/15/2018 14:21   ADDENDUM:  Pathology of the LEFT breast biopsy revealed PSEUDOANGIOMATOUS  STROMAL HYPERPLASIA ([place]). FIBROCYSTIC CHANGES. THERE IS NO  EVIDENCE OF MALIGNANCY.   This was found to be concordant by Dr. Jimmye Norman.   Recommendation: Six month follow-up diagnostic mammogram and  possible ultrasound LEFT breast.   At the patient's request, results and recommendations were relayed  to the patient by phone by Jetta Lout, [place] on 04/15/2018. The  patient stated she did well following the biopsy with no bleeding or  pain. Post biopsy instructions were reviewed with the patient and  all of her questions were answered. She was encouraged to contact  the imaging department of [facility] with any further  questions or concerns.   Addendum by Jetta Lout, RRA on 04/15/2018.   Electronically Signed    By: Dorise Bullion III M.D    On: 04/15/2018 14:21 Narrative: CLINICAL DATA:  Biopsy of left breast  EXAM: ULTRASOUND GUIDED LEFT BREAST CORE NEEDLE BIOPSY  COMPARISON:  Previous exam(s).  FINDINGS: I met with the patient and we discussed the procedure of ultrasound-guided biopsy, including benefits and alternatives. We discussed the high likelihood of a successful procedure. We discussed the risks of the procedure, including infection, bleeding, tissue injury, clip migration, and inadequate sampling. Informed written consent was given. The usual time-out protocol was performed immediately prior to the procedure.  Lesion quadrant: Lower  Using sterile technique and 1% Lidocaine as local anesthetic, under direct ultrasound visualization, a 12 gauge spring-loaded device was used to perform biopsy of a left breast mass at 3:30 using a lateral approach. At the conclusion of the procedure a tissue marker clip was deployed into the biopsy cavity. Follow up 2 view mammogram was performed and dictated  separately.  IMPRESSION: Ultrasound guided biopsy of a left breast mass 330. No apparent complications.  Electronically Signed: By: Dorise Bullion III M.D On: 04/14/2018 08:52 Note: Reviewed        Physical Exam  General appearance: alert, cooperative, in no distress and morbidly obese Mental status: Alert, oriented x 3 (person, place, & time)       Respiratory: No evidence of acute respiratory distress Eyes: PERLA Vitals: BP (!) 163/99 (BP Location: Right Arm, Patient Position: Sitting, Cuff Size: Large) Comment (Cuff Size): forearm   Pulse 87    Temp (!) 97.5 F (36.4 C) (Temporal)    Resp 16    Ht 5' 11"  (1.803 m)    Wt (!) 362 lb (164.2 kg)  LMP 08/07/2019    SpO2 100%    BMI 50.49 kg/m  BMI: Estimated body mass index is 50.49 kg/m as calculated from the following:   Height as of this encounter: 5' 11"  (1.803 m).   Weight as of this encounter: 362 lb (164.2 kg). Ideal: Ideal body weight: 70.8 kg (156 lb 1.4 oz) Adjusted ideal body weight: 108.2 kg (238 lb 7.2 oz)  Lumbar Spine Area Exam  Skin & Axial Inspection: No masses, redness, or swelling Alignment: Symmetrical Functional ROM: Pain restricted ROM affecting both sides Stability: No instability detected Muscle Tone/Strength: Functionally intact. No obvious neuro-muscular anomalies detected. Sensory (Neurological): Musculoskeletal pain pattern  Gait & Posture Assessment  Ambulation: Unassisted Gait: Modified gait pattern (slower gait speed, wider stride width, and longer stance duration) associated with morbid obesity Posture: Difficulty standing up straight, due to pain  Lower Extremity Exam    Side: Right lower extremity  Side: Left lower extremity  Stability: No instability observed          Stability: No instability observed          Skin & Extremity Inspection: Skin color, temperature, and hair growth are WNL. No peripheral edema or cyanosis. No masses, redness, swelling, asymmetry, or associated skin lesions.  No contractures.  Skin & Extremity Inspection: Skin color, temperature, and hair growth are WNL. No peripheral edema or cyanosis. No masses, redness, swelling, asymmetry, or associated skin lesions. No contractures.  Functional ROM: Pain restricted ROM for hip and knee joints          Functional ROM: Pain restricted ROM for hip and knee joints          Muscle Tone/Strength: Functionally intact. No obvious neuro-muscular anomalies detected.  Muscle Tone/Strength: Functionally intact. No obvious neuro-muscular anomalies detected.  Sensory (Neurological): Unimpaired        Sensory (Neurological): Unimpaired        DTR: Patellar: deferred today Achilles: deferred today Plantar: deferred today  DTR: Patellar: deferred today Achilles: deferred today Plantar: deferred today  Palpation: No palpable anomalies  Palpation: No palpable anomalies    Assessment   Status Diagnosis  Controlled Controlled Controlled 1. Chronic pain syndrome   2. Lumbar spondylosis   3. Chronic bilateral low back pain with bilateral sciatica   4. Lumbar degenerative disc disease   5. Lumbar foraminal stenosis   6. Morbid obesity (HCC)   7. Lumbar facet arthropathy      Updated Problems: Problem  Lumbar Spondylosis  Lumbar Foraminal Stenosis  Lumbar Degenerative Disc Disease  Left Leg Dvt (H[facility])   Overview:  Unprovoked LLE popliteal DVT   S/P Ivc Filter  Chronic Bilateral Low Back Pain With Bilateral Sciatica  Chronic Pain of Left Ankle  Chronic Pain Syndrome  Long Term Current Use of Opiate Analgesic  Morbid Obesity ([facility])  Back Pain With Left-Sided Radiculopathy  Pleuritic Chest Pain  Saddle Embolus of Pulmonary Artery With Acute Cor Pulmonale (H[facility])    Plan of Care  [name] [name] [name] has a current medication list which includes the following long-term medication(s): amlodipine, lisinopril, triamterene-hydrochlorothiazide, warfarin, and duloxetine.  Pharmacotherapy (Medications Ordered): Meds  ordered this encounter  Medications   oxyCODONE-acetaminophen (PERCOCET) 10-325 MG tablet    Sig: Take 1 tablet by mouth 2 (two) times daily as needed for pain. Must last 30 days. Max 45/month    Dispense:  45 tablet    Refill:  0    Chronic Pain. (STOP Act - Not applicable). Fill one day early  if closed on scheduled refill date.   oxyCODONE-acetaminophen (PERCOCET) 10-325 MG tablet    Sig: Take 1 tablet by mouth daily as needed for pain. Must last 30 days. Max 45/month    Dispense:  30 tablet    Refill:  0    Chronic Pain. (STOP Act - Not applicable). Fill one day early if closed on scheduled refill date.   oxyCODONE-acetaminophen (PERCOCET) 10-325 MG tablet    Sig: Take 1 tablet by mouth daily as needed for pain. Must last 30 days. Max 45/month    Dispense:  30 tablet    Refill:  0    Chronic Pain. (STOP Act - Not applicable). Fill one day early if closed on scheduled refill date.   Orders:  Orders Placed This Encounter  Procedures   ToxASSURE Select 13 (MW), Urine    Volume: 30 ml(s). Minimum 3 ml of urine is needed. Document temperature of fresh sample. Indications: Long term (current) use of opiate analgesic [phone])    Order Specific Question:   Release to patient    Answer:   Immediate   Follow-up plan:   Return in about 12 weeks (around 11/15/2019) for Medication Management, in person.   Recent Visits No visits were found meeting these conditions. Showing recent visits within past 90 days and meeting all other requirements Today's Visits Date Type Provider Dept  08/23/19 Office Visit [name] Santa, MD [facility]  Showing today's visits and meeting all other requirements Future Appointments No visits were found meeting these conditions. Showing future appointments within next 90 days and meeting all other requirements  I discussed the assessment and treatment plan with the patient. The patient was provided an opportunity to ask questions and all were  answered. The patient agreed with the plan and demonstrated an understanding of the instructions.  Patient advised to call back or seek an in-person evaluation if the symptoms or condition worsens.  Duration of encounter:30 minutes.  Note by: [name] Santa, MD Date: 08/23/2019; Time: 2:18 PM

## 2019-08-23 NOTE — Progress Notes (Signed)
Nursing Pain Medication Assessment:  Safety precautions to be maintained throughout the outpatient stay will include: orient to surroundings, keep bed in low position, maintain call bell within reach at all times, provide assistance with transfer out of bed and ambulation.  Medication Inspection Compliance: [name] [name] did not comply with our request to bring her pills to be counted. She was reminded that bringing the medication bottles, even when empty, is a requirement.  Medication: None brought in. Pill/Patch Count: None available to be counted. Bottle Appearance: No container available. Did not bring bottle(s) to appointment. Filled Date: N/A Last Medication intake:  Ran out of medicine more than 48 hours ago

## 2019-08-23 NOTE — Telephone Encounter (Signed)
Patient did not return to give UDS that was ordered.  She was unable to give adequate specimen at time of appt.  I did report to Dr Holley Raring and he wanted me to call and remind the patient.  Voicemail left to please come give urine sample for UDS.

## 2019-08-24 ENCOUNTER — Ambulatory Visit (HOSPITAL_COMMUNITY): Payer: Medicare Other

## 2019-08-30 ENCOUNTER — Other Ambulatory Visit: Payer: Self-pay | Admitting: Cardiology

## 2019-08-30 DIAGNOSIS — I2782 Chronic pulmonary embolism: Secondary | ICD-10-CM

## 2019-08-31 ENCOUNTER — Ambulatory Visit (HOSPITAL_COMMUNITY): Payer: Medicare Other

## 2019-09-02 ENCOUNTER — Ambulatory Visit (HOSPITAL_COMMUNITY): Payer: Medicare Other

## 2019-09-02 ENCOUNTER — Telehealth: Payer: Self-pay

## 2019-09-02 NOTE — Telephone Encounter (Signed)
lmom for overdue inr 

## 2019-09-06 ENCOUNTER — Ambulatory Visit: Payer: Medicare Other | Admitting: Obstetrics and Gynecology

## 2019-10-26 ENCOUNTER — Telehealth: Payer: Self-pay

## 2019-10-26 NOTE — Telephone Encounter (Signed)
lmom for overdue inr 

## 2019-11-25 ENCOUNTER — Other Ambulatory Visit: Payer: Self-pay

## 2019-11-25 ENCOUNTER — Ambulatory Visit
Payer: Medicare Other | Attending: Student in an Organized Health Care Education/Training Program | Admitting: Student in an Organized Health Care Education/Training Program

## 2019-11-25 ENCOUNTER — Encounter: Payer: Self-pay | Admitting: Student in an Organized Health Care Education/Training Program

## 2019-11-25 VITALS — BP 152/89 | HR 101 | Temp 97.2°F | Resp 18 | Ht 70.0 in | Wt 324.0 lb

## 2019-11-25 DIAGNOSIS — M5441 Lumbago with sciatica, right side: Secondary | ICD-10-CM | POA: Insufficient documentation

## 2019-11-25 DIAGNOSIS — M47816 Spondylosis without myelopathy or radiculopathy, lumbar region: Secondary | ICD-10-CM | POA: Insufficient documentation

## 2019-11-25 DIAGNOSIS — M25572 Pain in left ankle and joints of left foot: Secondary | ICD-10-CM | POA: Insufficient documentation

## 2019-11-25 DIAGNOSIS — Z79891 Long term (current) use of opiate analgesic: Secondary | ICD-10-CM | POA: Insufficient documentation

## 2019-11-25 DIAGNOSIS — M5442 Lumbago with sciatica, left side: Secondary | ICD-10-CM | POA: Diagnosis present

## 2019-11-25 DIAGNOSIS — M48061 Spinal stenosis, lumbar region without neurogenic claudication: Secondary | ICD-10-CM | POA: Insufficient documentation

## 2019-11-25 DIAGNOSIS — G8929 Other chronic pain: Secondary | ICD-10-CM

## 2019-11-25 DIAGNOSIS — M5136 Other intervertebral disc degeneration, lumbar region: Secondary | ICD-10-CM | POA: Insufficient documentation

## 2019-11-25 DIAGNOSIS — G894 Chronic pain syndrome: Secondary | ICD-10-CM | POA: Diagnosis present

## 2019-11-25 MED ORDER — OXYCODONE-ACETAMINOPHEN 10-325 MG PO TABS: 1.0000 | ORAL_TABLET | Freq: Two times a day (BID) | ORAL | 0 refills | Status: AC | PRN

## 2019-11-25 NOTE — Progress Notes (Signed)
PROVIDER NOTE: Information contained herein reflects review and annotations entered in association with encounter. Interpretation of such information and data should be left to medically-trained personnel. Information provided to patient can be located elsewhere in the medical record under "Patient Instructions". Document created using STT-dictation technology, any transcriptional errors that may result from process are unintentional.    Patient: [name] [name]  Service Category: E/M  Provider: Gillis Santa, MD  DOB: 1975/02/29  DOS: 11/25/2019  Specialty: Interventional Pain Management  MRN: [id number]  Setting: Ambulatory outpatient  PCP: Rosita Fire, MD  Type: Established Patient    Referring Provider: Rosita Fire, MD  Location: Office  Delivery: Face-to-face     HPI  [name] [name], a 44 y.o. year old female, is here today because of her Lumbar spondylosis [M47.816]. [name] [name] primary complain today is Back Pain Last encounter: My last encounter with her was on 08/23/2019. Pertinent problems: [name] [name] has Pleuritic chest pain; Saddle embolus of pulmonary artery with acute cor pulmonale ([place]); Back pain with left-sided radiculopathy; Morbid obesity ([place]); Left leg DVT ([place]); S/P IVC filter; Chronic bilateral low back pain with bilateral sciatica; Chronic pain of left ankle; Chronic pain syndrome; Long term current use of opiate analgesic; Lumbar spondylosis; Lumbar foraminal stenosis; and Lumbar degenerative disc disease on their pertinent problem list. Pain Assessment: Severity of   is reported as a 6 /10. Location:    / . Onset:  . Quality:  . Timing:  . Modifying factor(s):  Marland Kitchen Vitals:  height is _0  (1.778 m) and weight is 324 lb (147 kg) (abnormal). Her temporal temperature is 97.2 F (36.2 C) (abnormal). Her blood pressure is 152/89 (abnormal) and her pulse is 101 (abnormal). Her respiration is 18 and oxygen saturation is 100%.   Reason for encounter: medication management.    No change in medical history since last visit.  Patient's pain is at baseline.  Patient continues multimodal pain regimen as prescribed.  States that it provides pain relief and improvement in functional status.   Pharmacotherapy Assessment   Analgesic: Percocet 10 mg daily prn   Monitoring: [place] PMP: PDMP reviewed during this encounter.       Pharmacotherapy: No side-effects or adverse reactions reported. Compliance: No problems identified. Effectiveness: Clinically acceptable.  No notes on file  UDS:  Summary  Date Value Ref Range Status  10/13/2018 Note  Final    Comment:    ==================================================================== ToxASSURE Select 13 (MW) ==================================================================== Test                             Result       Flag       Units Drug Present   Oxycodone                      651                     ng/mg creat   Noroxycodone                   935                     ng/mg creat    Sources of oxycodone include scheduled prescription medications.    Noroxycodone is an expected metabolite of oxycodone. ==================================================================== Test  Result    Flag   Units      Ref Range   Creatinine              43               mg/dL      >=20 ==================================================================== Declared Medications:  Medication list was not provided. ==================================================================== For clinical consultation, please call [phone]. ====================================================================      ROS  Constitutional: Denies any fever or chills Gastrointestinal: No reported hemesis, hematochezia, vomiting, or acute GI distress Musculoskeletal: Denies any acute onset joint swelling, redness, loss of ROM, or weakness Neurological: No reported episodes of acute onset apraxia, aphasia, dysarthria,  agnosia, amnesia, paralysis, loss of coordination, or loss of consciousness  Medication Review  DULoxetine, amLODipine, ergocalciferol, lisinopril, oxyCODONE-acetaminophen, triamterene-hydrochlorothiazide, and warfarin  History Review  Allergy: [name] [name] is allergic to sulfa antibiotics. Drug: [name] [name]  reports no history of drug use. Alcohol:  reports current alcohol use. Tobacco:  reports that she has been smoking cigarettes. She has a 4.50 pack-year smoking history. She has never used smokeless tobacco. Social: [name] [name]  reports that she has been smoking cigarettes. She has a 4.50 pack-year smoking history. She has never used smokeless tobacco. She reports current alcohol use. She reports that she does not use drugs. Medical:  has a past medical history of Abdominal cramps (08/30/2015), Anemia, Blood transfusion without reported diagnosis, Closed left ankle fracture, Clotting disorder ([place]), DVT (deep venous thrombosis) ([place]), Fibroid tumor, Hidradenitis, High blood pressure, Hypertension, Obesity, OSA on CPAP, Oxygen deficiency, Pregnancy, Pulmonary embolism ([place]) (04/2013), Sleep apnea, and Tobacco use disorder. Surgical: [name] [name]  has a past surgical history that includes Cyst removal trunk; Abscess drainage (Bilateral, 01/10/15); IVC FILTER PLACEMENT ([place] [facility]X); and Colonoscopy with propofol (N/A, 06/08/2018). Family: family history includes Cancer in her maternal grandmother; Diabetes in her paternal aunt and paternal uncle; Heart attack in her paternal grandmother; Hypertension in her mother; Other in her father.  Laboratory Chemistry Profile   Renal Lab Results  Component Value Date   BUN 11 04/13/2019   CREATININE 0.81 04/13/2019   LABCREA 50.00 82/95/6213   BCR NOT APPLICABLE 08/65/7846   GFRAA >60 04/13/2019   GFRNONAA >60 04/13/2019     Hepatic Lab Results  Component Value Date   AST 18 04/13/2019   ALT 16 04/13/2019   ALBUMIN 3.5 04/13/2019   ALKPHOS 53 04/13/2019      Electrolytes Lab Results  Component Value Date   NA 137 04/13/2019   K 4.6 04/13/2019   CL 104 04/13/2019   CALCIUM 8.7 (L) 04/13/2019     Bone Lab Results  Component Value Date   VD25OH 29.40 (L) 08/11/2019     Inflammation (CRP: Acute Phase) (ESR: Chronic Phase) Lab Results  Component Value Date   ESRSEDRATE 25 (H) 10/28/2013   LATICACIDVEN 6.2 (H) 04/27/2013       Note: Above Lab results reviewed.  Recent Imaging Review  [place] LT BREAST BX W LOC DEV 1ST LESION IMG BX SPEC US GUIDE Addendum: ADDENDUM REPORT: 04/15/2018 14:21   ADDENDUM:  Pathology of the LEFT breast biopsy revealed PSEUDOANGIOMATOUS  STROMAL HYPERPLASIA ([place]). FIBROCYSTIC CHANGES. THERE IS NO  EVIDENCE OF MALIGNANCY.   This was found to be concordant by Dr. Jimmye Norman.   Recommendation: Six month follow-up diagnostic mammogram and  possible ultrasound LEFT breast.   At the patient's request, results and recommendations were relayed  to the patient by phone by Jetta Lout, [place]  on 04/15/2018. The  patient stated she did well following the biopsy with no bleeding or  pain. Post biopsy instructions were reviewed with the patient and  all of her questions were answered. She was encouraged to contact  the imaging department of [facility] with any further  questions or concerns.   Addendum by Jetta Lout, RRA on 04/15/2018.   Electronically Signed    By: Dorise Bullion III M.D    On: 04/15/2018 14:21 Narrative: CLINICAL DATA:  Biopsy of left breast  EXAM: ULTRASOUND GUIDED LEFT BREAST CORE NEEDLE BIOPSY  COMPARISON:  Previous exam(s).  FINDINGS: I met with the patient and we discussed the procedure of ultrasound-guided biopsy, including benefits and alternatives. We discussed the high likelihood of a successful procedure. We discussed the risks of the procedure, including infection, bleeding, tissue injury, clip migration, and inadequate sampling. Informed written consent was given.  The usual time-out protocol was performed immediately prior to the procedure.  Lesion quadrant: Lower  Using sterile technique and 1% Lidocaine as local anesthetic, under direct ultrasound visualization, a 12 gauge spring-loaded device was used to perform biopsy of a left breast mass at 3:30 using a lateral approach. At the conclusion of the procedure a tissue marker clip was deployed into the biopsy cavity. Follow up 2 view mammogram was performed and dictated separately.  IMPRESSION: Ultrasound guided biopsy of a left breast mass 330. No apparent complications.  Electronically Signed: By: Dorise Bullion III M.D On: 04/14/2018 08:52 Note: Reviewed        Physical Exam  General appearance: Well nourished, well developed, and well hydrated. In no apparent acute distress Mental status: Alert, oriented x 3 (person, place, & time)       Respiratory: No evidence of acute respiratory distress Eyes: PERLA Vitals: BP (!) 152/89 (BP Location: Left Arm, Patient Position: Sitting, Cuff Size: Large)   Pulse (!) 101   Temp (!) 97.2 F (36.2 C) (Temporal)   Resp 18   Ht _0  (1.778 m)   Wt (!) 324 lb (147 kg)   SpO2 100%   BMI 46.49 kg/m  BMI: Estimated body mass index is 46.49 kg/m as calculated from the following:   Height as of this encounter: _1  (1.778 m).   Weight as of this encounter: 324 lb (147 kg). Ideal: Ideal body weight: 68.5 kg (151 lb 0.2 oz) Adjusted ideal body weight: 99.9 kg (220 lb 3.3 oz)  Lumbar Spine Area Exam  Skin & Axial Inspection: No masses, redness, or swelling Alignment: Symmetrical Functional ROM: Pain restricted ROM affecting both sides Stability: No instability detected Muscle Tone/Strength: Functionally intact. No obvious neuro-muscular anomalies detected. Sensory (Neurological): Musculoskeletal pain pattern  Gait & Posture Assessment  Ambulation: Unassisted Gait: Modified gait pattern (slower gait speed, wider stride width, and longer  stance duration) associated with morbid obesity Posture: Difficulty standing up straight, due to pain  Lower Extremity Exam    Side: Right lower extremity  Side: Left lower extremity  Stability: No instability observed          Stability: No instability observed          Skin & Extremity Inspection: Skin color, temperature, and hair growth are WNL. No peripheral edema or cyanosis. No masses, redness, swelling, asymmetry, or associated skin lesions. No contractures.  Skin & Extremity Inspection: Skin color, temperature, and hair growth are WNL. No peripheral edema or cyanosis. No masses, redness, swelling, asymmetry, or associated skin lesions. No contractures.  Functional  ROM: Pain restricted ROM for hip and knee joints          Functional ROM: Pain restricted ROM for hip and knee joints          Muscle Tone/Strength: Functionally intact. No obvious neuro-muscular anomalies detected.  Muscle Tone/Strength: Functionally intact. No obvious neuro-muscular anomalies detected.  Sensory (Neurological): Unimpaired        Sensory (Neurological): Unimpaired        DTR: Patellar: deferred today Achilles: deferred today Plantar: deferred today  DTR: Patellar: deferred today Achilles: deferred today Plantar: deferred today  Palpation: No palpable anomalies  Palpation: No palpable anomalies     Assessment   Status Diagnosis  Controlled Controlled Controlled 1. Lumbar spondylosis   2. Chronic bilateral low back pain with bilateral sciatica   3. Lumbar degenerative disc disease   4. Lumbar foraminal stenosis   5. Morbid obesity (HCC)   6. Lumbar facet arthropathy   7. Chronic pain of left ankle   8. Long term current use of opiate analgesic   9. Chronic pain syndrome       Plan of Care   [name] [name] has a current medication list which includes the following long-term medication(s): amlodipine, duloxetine, lisinopril, triamterene-hydrochlorothiazide, and  warfarin.  Pharmacotherapy (Medications Ordered): Meds ordered this encounter  Medications  . oxyCODONE-acetaminophen (PERCOCET) 10-325 MG tablet    Sig: Take 1 tablet by mouth 2 (two) times daily as needed for pain. Must last 30 days.    Dispense:  60 tablet    Refill:  0    Chronic Pain. (STOP Act - Not applicable). Fill one day early if closed on scheduled refill date.   Orders:  Orders Placed This Encounter  Procedures  . ToxASSURE Select 13 (MW), Urine    Volume: 30 ml(s). Minimum 3 ml of urine is needed. Document temperature of fresh sample. Indications: Long term (current) use of opiate analgesic [phone])    Order Specific Question:   Release to patient    Answer:   Immediate   Follow-up plan:   Return in about 10 weeks (around 02/03/2020) for Medication Management, in person.   Recent Visits No visits were found meeting these conditions. Showing recent visits within past 90 days and meeting all other requirements Today's Visits Date Type Provider Dept  11/25/19 Office Visit Gillis Santa, MD [facility]  Showing today's visits and meeting all other requirements Future Appointments Date Type Provider Dept  01/25/20 Appointment Gillis Santa, MD [facility]  Showing future appointments within next 90 days and meeting all other requirements  I discussed the assessment and treatment plan with the patient. The patient was provided an opportunity to ask questions and all were answered. The patient agreed with the plan and demonstrated an understanding of the instructions.  Patient advised to call back or seek an in-person evaluation if the symptoms or condition worsens.  Duration of encounter: 30 minutes.  Note by: Gillis Santa, MD Date: 11/25/2019; Time: 1:03 PM

## 2019-11-30 ENCOUNTER — Encounter: Payer: Self-pay | Admitting: Student in an Organized Health Care Education/Training Program

## 2019-11-30 DIAGNOSIS — F1410 Cocaine abuse, uncomplicated: Secondary | ICD-10-CM

## 2019-12-01 LAB — TOXASSURE SELECT 13 (MW), URINE

## 2019-12-01 LAB — MED LIST RXNORM/TEST MISMATCH

## 2019-12-09 ENCOUNTER — Encounter: Payer: Medicare Other | Admitting: Student in an Organized Health Care Education/Training Program

## 2019-12-13 ENCOUNTER — Other Ambulatory Visit (HOSPITAL_COMMUNITY): Payer: Self-pay

## 2019-12-13 DIAGNOSIS — D500 Iron deficiency anemia secondary to blood loss (chronic): Secondary | ICD-10-CM

## 2019-12-13 DIAGNOSIS — E559 Vitamin D deficiency, unspecified: Secondary | ICD-10-CM

## 2019-12-14 ENCOUNTER — Other Ambulatory Visit: Payer: Self-pay

## 2019-12-14 ENCOUNTER — Inpatient Hospital Stay (HOSPITAL_COMMUNITY): Payer: Medicare Other | Attending: Hematology

## 2019-12-14 DIAGNOSIS — D500 Iron deficiency anemia secondary to blood loss (chronic): Secondary | ICD-10-CM

## 2019-12-14 DIAGNOSIS — D509 Iron deficiency anemia, unspecified: Secondary | ICD-10-CM | POA: Insufficient documentation

## 2019-12-14 DIAGNOSIS — E559 Vitamin D deficiency, unspecified: Secondary | ICD-10-CM | POA: Insufficient documentation

## 2019-12-14 LAB — COMPREHENSIVE METABOLIC PANEL
ALT: 10 U/L (ref 0–44)
AST: 13 U/L — ABNORMAL LOW (ref 15–41)
Albumin: 3.5 g/dL (ref 3.5–5.0)
Alkaline Phosphatase: 50 U/L (ref 38–126)
Anion gap: 9 (ref 5–15)
BUN: 10 mg/dL (ref 6–20)
CO2: 26 mmol/L (ref 22–32)
Calcium: 9.1 mg/dL (ref 8.9–10.3)
Chloride: 100 mmol/L (ref 98–111)
Creatinine, Ser: 0.94 mg/dL (ref 0.44–1.00)
GFR, Estimated: >60 mL/min (ref >60)
Glucose, Bld: 123 mg/dL — ABNORMAL HIGH (ref 70–99)
Potassium: 4.1 mmol/L (ref 3.5–5.1)
Sodium: 135 mmol/L (ref 135–145)
Total Bilirubin: 0.4 mg/dL (ref 0.3–1.2)
Total Protein: 8.5 g/dL — ABNORMAL HIGH (ref 6.5–8.1)

## 2019-12-14 LAB — FERRITIN: Ferritin: 5 ng/mL — ABNORMAL LOW (ref 11–307)

## 2019-12-14 LAB — IRON AND TIBC
Iron: 20 ug/dL — ABNORMAL LOW (ref 28–170)
Saturation Ratios: 4 % — ABNORMAL LOW (ref 10.4–31.8)
TIBC: 488 ug/dL — ABNORMAL HIGH (ref 250–450)
UIBC: 468 ug/dL

## 2019-12-14 LAB — CBC WITH DIFFERENTIAL/PLATELET
Abs Immature Granulocytes: 0.02 10*3/uL (ref 0.00–0.07)
Basophils Absolute: 0.1 10*3/uL (ref 0.0–0.1)
Basophils Relative: 1 %
Eosinophils Absolute: 0.1 10*3/uL (ref 0.0–0.5)
Eosinophils Relative: 2 %
HCT: 32.5 % — ABNORMAL LOW (ref 36.0–46.0)
Hemoglobin: 8.5 g/dL — ABNORMAL LOW (ref 12.0–15.0)
Immature Granulocytes: 0 %
Lymphocytes Relative: 20 %
Lymphs Abs: 1.3 10*3/uL (ref 0.7–4.0)
MCH: 19.1 pg — ABNORMAL LOW (ref 26.0–34.0)
MCHC: 26.2 g/dL — ABNORMAL LOW (ref 30.0–36.0)
MCV: 73 fL — ABNORMAL LOW (ref 80.0–100.0)
Monocytes Absolute: 0.5 10*3/uL (ref 0.1–1.0)
Monocytes Relative: 7 %
Neutro Abs: 4.7 10*3/uL (ref 1.7–7.7)
Neutrophils Relative %: 70 %
Platelets: 406 10*3/uL — ABNORMAL HIGH (ref 150–400)
RBC: 4.45 MIL/uL (ref 3.87–5.11)
RDW: 19.1 % — ABNORMAL HIGH (ref 11.5–15.5)
WBC: 6.7 10*3/uL (ref 4.0–10.5)
nRBC: 0 % (ref 0.0–0.2)

## 2019-12-14 LAB — VITAMIN D 25 HYDROXY (VIT D DEFICIENCY, FRACTURES): Vit D, 25-Hydroxy: 45.04 ng/mL (ref 30–100)

## 2019-12-14 LAB — FOLATE: Folate: 8.1 ng/mL (ref >5.9)

## 2019-12-21 ENCOUNTER — Inpatient Hospital Stay (HOSPITAL_COMMUNITY): Payer: Medicare Other | Admitting: Oncology

## 2020-01-11 ENCOUNTER — Other Ambulatory Visit (HOSPITAL_COMMUNITY): Payer: Self-pay | Admitting: Internal Medicine

## 2020-01-11 DIAGNOSIS — N63 Unspecified lump in unspecified breast: Secondary | ICD-10-CM

## 2020-01-20 ENCOUNTER — Ambulatory Visit (HOSPITAL_COMMUNITY)
Admission: RE | Admit: 2020-01-20 | Discharge: 2020-01-20 | Disposition: A | Discharge status: Home / Self Care | Payer: Medicare Other | Source: Ambulatory Visit | Attending: Internal Medicine | Admitting: Internal Medicine

## 2020-01-20 ENCOUNTER — Other Ambulatory Visit: Payer: Self-pay | Admitting: Cardiology

## 2020-01-20 ENCOUNTER — Other Ambulatory Visit: Payer: Self-pay

## 2020-01-20 ENCOUNTER — Other Ambulatory Visit (HOSPITAL_COMMUNITY): Payer: Self-pay | Admitting: Internal Medicine

## 2020-01-20 DIAGNOSIS — I2602 Saddle embolus of pulmonary artery with acute cor pulmonale: Secondary | ICD-10-CM

## 2020-01-20 DIAGNOSIS — N6312 Unspecified lump in the right breast, upper inner quadrant: Secondary | ICD-10-CM | POA: Insufficient documentation

## 2020-01-20 DIAGNOSIS — N63 Unspecified lump in unspecified breast: Secondary | ICD-10-CM

## 2020-01-20 DIAGNOSIS — N6489 Other specified disorders of breast: Secondary | ICD-10-CM | POA: Insufficient documentation

## 2020-01-20 DIAGNOSIS — I2782 Chronic pulmonary embolism: Secondary | ICD-10-CM

## 2020-01-20 DIAGNOSIS — R928 Other abnormal and inconclusive findings on diagnostic imaging of breast: Secondary | ICD-10-CM

## 2020-01-20 NOTE — Telephone Encounter (Addendum)
Pt was last seen in the [facility] on 02/22/2019; pt has been contacted multiple times without success. Will call pt again to schedule an appt to be seen. Left message for pt to call back to get scheduled.

## 2020-01-25 ENCOUNTER — Encounter: Payer: Medicare Other | Admitting: Student in an Organized Health Care Education/Training Program

## 2020-01-25 ENCOUNTER — Ambulatory Visit (HOSPITAL_COMMUNITY): Admission: RE | Admit: 2020-01-25 | Payer: Medicare Other | Source: Ambulatory Visit

## 2020-01-25 NOTE — Telephone Encounter (Signed)
Spoke with pt.  States she is out of warfarin and has not been getting it checked anywhere.  Stressed the importance of taking warfarin as directed and keeping it monitored regularly.  She verbalized understanding and INR appt was made for 02/02/20.  2 weeks of warfarin sent to [facility].

## 2020-01-27 DIAGNOSIS — H5210 Myopia, unspecified eye: Secondary | ICD-10-CM | POA: Diagnosis not present

## 2020-01-27 DIAGNOSIS — I10 Essential (primary) hypertension: Secondary | ICD-10-CM | POA: Diagnosis not present

## 2020-01-27 DIAGNOSIS — H35033 Hypertensive retinopathy, bilateral: Secondary | ICD-10-CM | POA: Diagnosis not present

## 2020-01-27 DIAGNOSIS — Z0100 Encounter for examination of eyes and vision without abnormal findings: Secondary | ICD-10-CM | POA: Diagnosis not present

## 2020-02-01 ENCOUNTER — Other Ambulatory Visit: Payer: Self-pay

## 2020-02-01 ENCOUNTER — Ambulatory Visit (HOSPITAL_COMMUNITY)
Admission: RE | Admit: 2020-02-01 | Discharge: 2020-02-01 | Disposition: A | Discharge status: Home / Self Care | Payer: Medicare HMO | Source: Ambulatory Visit | Attending: Internal Medicine | Admitting: Internal Medicine

## 2020-02-01 ENCOUNTER — Other Ambulatory Visit (HOSPITAL_COMMUNITY): Payer: Self-pay | Admitting: Internal Medicine

## 2020-02-01 DIAGNOSIS — R928 Other abnormal and inconclusive findings on diagnostic imaging of breast: Secondary | ICD-10-CM | POA: Diagnosis not present

## 2020-02-01 DIAGNOSIS — N61 Mastitis without abscess: Secondary | ICD-10-CM | POA: Diagnosis not present

## 2020-02-01 DIAGNOSIS — N6312 Unspecified lump in the right breast, upper inner quadrant: Secondary | ICD-10-CM | POA: Diagnosis not present

## 2020-02-01 DIAGNOSIS — N6011 Diffuse cystic mastopathy of right breast: Secondary | ICD-10-CM | POA: Diagnosis not present

## 2020-02-01 DIAGNOSIS — I898 Other specified noninfective disorders of lymphatic vessels and lymph nodes: Secondary | ICD-10-CM | POA: Diagnosis not present

## 2020-02-01 DIAGNOSIS — R590 Localized enlarged lymph nodes: Secondary | ICD-10-CM | POA: Diagnosis not present

## 2020-02-01 HISTORY — PX: BREAST BIOPSY: SHX20

## 2020-02-01 MED ORDER — LIDOCAINE-EPINEPHRINE (PF) 1 %-1:200000 IJ SOLN
INTRAMUSCULAR | Status: AC
  Filled 2020-02-01: qty 30

## 2020-02-01 MED ORDER — SODIUM BICARBONATE 4.2 % IV SOLN
INTRAVENOUS | Status: AC
  Filled 2020-02-01: qty 10

## 2020-02-01 MED ORDER — LIDOCAINE HCL (PF) 2 % IJ SOLN
INTRAMUSCULAR | Status: AC
  Filled 2020-02-01: qty 20

## 2020-02-02 ENCOUNTER — Ambulatory Visit (INDEPENDENT_AMBULATORY_CARE_PROVIDER_SITE_OTHER): Payer: Medicare HMO | Admitting: *Deleted

## 2020-02-02 DIAGNOSIS — I2699 Other pulmonary embolism without acute cor pulmonale: Secondary | ICD-10-CM | POA: Diagnosis not present

## 2020-02-02 DIAGNOSIS — I2602 Saddle embolus of pulmonary artery with acute cor pulmonale: Secondary | ICD-10-CM

## 2020-02-02 DIAGNOSIS — Z86711 Personal history of pulmonary embolism: Secondary | ICD-10-CM

## 2020-02-02 DIAGNOSIS — Z5181 Encounter for therapeutic drug level monitoring: Secondary | ICD-10-CM

## 2020-02-02 DIAGNOSIS — I2782 Chronic pulmonary embolism: Secondary | ICD-10-CM

## 2020-02-02 LAB — POCT INR: INR: 1.9 — AB (ref 2.0–3.0)

## 2020-02-02 LAB — SURGICAL PATHOLOGY

## 2020-02-02 MED ORDER — WARFARIN SODIUM 10 MG PO TABS: ORAL_TABLET | ORAL | 1 refills | Status: DC

## 2020-02-02 NOTE — Patient Instructions (Signed)
Take warfarin 1 1/2 tablets tonight then increase dose to 1 tablet daily except 1 1/2 tablets on Tuesdays and Saturdays Recheck in 4 wks

## 2020-02-06 LAB — AEROBIC/ANAEROBIC CULTURE W GRAM STAIN (SURGICAL/DEEP WOUND): Special Requests: NORMAL

## 2020-02-09 ENCOUNTER — Telehealth: Payer: Self-pay | Admitting: *Deleted

## 2020-02-09 DIAGNOSIS — I2782 Chronic pulmonary embolism: Secondary | ICD-10-CM

## 2020-02-09 MED ORDER — WARFARIN SODIUM 10 MG PO TABS: ORAL_TABLET | ORAL | 1 refills | Status: DC

## 2020-02-09 NOTE — Telephone Encounter (Signed)
*  STAT* If patient is at the pharmacy, call can be transferred to refill team.   1. Which medications need to be refilled? (please list name of each medication and dose if known) [place]  2. Which pharmacy/location (including street and city if local pharmacy) is medication to be sent to? [place]  3. Do they need a 30 day or 90 day supply? 90 day

## 2020-02-11 ENCOUNTER — Other Ambulatory Visit: Payer: Self-pay | Admitting: Family Medicine

## 2020-02-11 DIAGNOSIS — N63 Unspecified lump in unspecified breast: Secondary | ICD-10-CM

## 2020-02-17 DIAGNOSIS — I10 Essential (primary) hypertension: Secondary | ICD-10-CM | POA: Diagnosis not present

## 2020-02-17 DIAGNOSIS — R69 Illness, unspecified: Secondary | ICD-10-CM | POA: Diagnosis not present

## 2020-02-20 NOTE — Progress Notes (Unsigned)
Cardiology Office Note  Date: 02/21/2020   ID: [name] [name], DOB 04-Aug-1975, MRN [id number]  PCP:  Rosita Fire, MD  Cardiologist:  Carlyle Dolly, MD Electrophysiologist:  None   Chief Complaint: Follow up  History of Present Illness: [name] [name] is a 45 y.o. female with a history of pulmonary embolism/pulmonary hypertension,HTN, anemia.  History of unprovoked saddle PE 2015. Plans were for lifelong anticoagulation. Had been on Coumadin. Repeat CT showed near complete lysis of prior extensive PE. Last echocardiogram 09/09/2017 echo with EF 60 to 65%, no WMA's, normal RV.   Last encounter with Dr. Harl Bowie 02/12/2019 via telemedicine.. No bleeding on Coumadin. She was compliant with antihypertensive medication. Followed by hematology for anemia receiving iron infus  Here for 39-month follow-up today.  She denies any recent issues.  States she recently saw her PCP who stopped her lisinopril due to low blood pressures.  Denies any anginal or exertional symptoms, DVT or PE-like symptoms, tachycardia, shortness of breath, or unilateral leg swelling.  She is taking her Coumadin as directed.  She denies any CVA or TIA-like symptoms, orthostatic symptoms, palpitations or arrhythmias, PND, orthopnea, claudication-like symptoms,  lower extremity edema   Past Medical History:  Diagnosis Date  . Abdominal cramps 08/30/2015  . Anemia   . Blood transfusion without reported diagnosis   . Closed left ankle fracture    August 30 2012  . Clotting disorder ([place])   . DVT (deep venous thrombosis) (HCC)    L leg  . Fibroid tumor   . Hidradenitis   . High blood pressure   . Hypertension   . Obesity   . OSA on CPAP   . Oxygen deficiency   . Pregnancy    09/20/14 17 weeks  . Pulmonary embolism ([place]) 04/2013  . Sleep apnea   . Tobacco use disorder     Past Surgical History:  Procedure Laterality Date  . ABSCESS DRAINAGE Bilateral 01/10/15  . COLONOSCOPY WITH PROPOFOL N/A 06/08/2018    Procedure: COLONOSCOPY WITH PROPOFOL;  Surgeon: Daneil Dolin, MD;  Location: [facility];  Service: Endoscopy;  Laterality: N/A;  2:30pm  . CYST REMOVAL TRUNK    . IVC FILTER PLACEMENT ([facility] HX)      Current Outpatient Medications  Medication Sig Dispense Refill  . amLODipine (NORVASC) 10 MG tablet Take 1 tablet (10 mg total) by mouth daily. 90 tablet 3  . DULoxetine (CYMBALTA) 60 MG capsule Take 1 capsule (60 mg total) by mouth daily. 30 capsule 2  . ergocalciferol (VITAMIN D2) 1.25 MG (50000 UT) capsule Take 1 capsule (50,000 Units total) by mouth once a week. 16 capsule 4  . triamterene-hydrochlorothiazide (MAXZIDE-25) 37.5-25 MG tablet TAKE 1 TABLET BY MOUTH ONCE A DAY. 30 tablet 3  . warfarin (COUMADIN) 10 MG tablet Take 1 tablet daily except 1 1/2 tablets on Tuesdays and Saturdays or as directed 40 tablet 1   No current facility-administered medications for this visit.   Facility-Administered Medications Ordered in Other Visits  Medication Dose Route Frequency Provider Last Rate Last Admin  . 0.9 %  sodium chloride infusion   Intravenous Continuous Baird Cancer, PA-C 20 mL/hr at 12/22/15 1430 New Bag at 12/22/15 1430   Allergies:  Sulfa antibiotics   Social History: The patient  reports that she has been smoking cigarettes. She has a 4.50 pack-year smoking history. She has never used smokeless tobacco. She reports current alcohol use. She reports that she does not use drugs.  Family History: The patient's family history includes Cancer in her maternal grandmother; Diabetes in her paternal aunt and paternal uncle; Heart attack in her paternal grandmother; Hypertension in her mother; Other in her father.   ROS:  Please see the history of present illness. Otherwise, complete review of systems is positive for none.  All other systems are reviewed and negative.   Physical Exam: VS:  BP 138/86   Pulse 89   Ht 5\' 10"  (1.778 m)   Wt (!) 342 lb 12.8 oz (155.5 kg)   SpO2 97%    BMI 49.19 kg/m , BMI Body mass index is 49.19 kg/m.  Wt Readings from Last 3 Encounters:  02/21/20 (!) 342 lb 12.8 oz (155.5 kg)  11/25/19 (!) 324 lb (147 kg)  08/23/19 (!) 362 lb (164.2 kg)    General: Morbidly obese patient appears comfortable at rest. Neck: Supple, no elevated JVP or carotid bruits, no thyromegaly. Lungs: Clear to auscultation, nonlabored breathing at rest. Cardiac: Regular rate and rhythm, no S3 or significant systolic murmur, no pericardial rub. Extremities: No pitting edema, distal pulses 2+. Skin: Warm and dry. Musculoskeletal: No kyphosis. Neuropsychiatric: Alert and oriented x3, affect grossly appropriate.  ECG:    Recent Labwork: 12/14/2019: ALT 10; AST 13; BUN 10; Creatinine, Ser 0.94; Hemoglobin 8.5; Platelets 406; Potassium 4.1; Sodium 135     Component Value Date/Time   CHOL 159 01/31/2017 1119   TRIG 73 01/31/2017 1119   HDL 51 01/31/2017 1119   CHOLHDL 3.1 01/31/2017 1119   LDLCALC 92 01/31/2017 1119    Other Studies Reviewed Today:  04/2013 echo Study Conclusions  - Study data: Technically difficult study. - Procedure narrative: Transthoracic echocardiography. Image quality was adequate. The study was technically difficult, as a result of body habitus. - Left ventricle: The cavity size was normal. Wall thickness was increased in a pattern of moderate LVH. Systolic function was vigorous. The estimated ejection fraction was in the range of 65% to 70%. Wall motion was normal; there were no regional wall motion abnormalities. Doppler parameters are consistent with abnormal left ventricular relaxation (grade 1 diastolic dysfunction). - Aortic valve: Valve area: 2.01cm^2(VTI). Valve area: 2.11cm^2 (Vmax). - Right ventricle: There is systolic flattening of the interventricular septum consistent with RV pressure overload. The cavity size was severely dilated. Systolic function was mildly reduced. - Pulmonary  arteries: Systolic pressure was moderately increased. PA peak pressure: 49mm Hg (S).   08/2017 echo Study Conclusions  - Left ventricle: The cavity size was normal. Wall thickness was increased in a pattern of mild LVH. Systolic function was normal. The estimated ejection fraction was in the range of 60% to 65%. Wall motion was normal; there were no regional wall motion abnormalities. Left ventricular diastolic function parameters were normal.   Assessment and Plan:  1. Bilateral pulmonary embolism ([place])   2. Essential hypertension    1. Bilateral pulmonary embolism (HCC) No recent PE-like symptoms.  No PND, no orthopnea no tachycardia, no unilateral leg swelling or claudication.  She is compliant with Coumadin therapy.  2. Essential hypertension Blood pressure is slightly elevated today.  Patient states her primary care provider stopped her lisinopril due to her blood pressure being too low.  Continue amlodipine 10 mg daily.  Continue Maxide 27.5/25 mg daily.  Medication Adjustments/Labs and Tests Ordered: Current medicines are reviewed at length with the patient today.  Concerns regarding medicines are outlined above.   Disposition: Follow-up with Dr. Harl Bowie or APP 6 months  Signed, Levell July,  NP 02/21/2020 10:01 AM    [facility] [place], [place], [place] [address] Phone: [phone]; Fax: [phone]

## 2020-02-21 ENCOUNTER — Encounter: Payer: Self-pay | Admitting: Family Medicine

## 2020-02-21 ENCOUNTER — Ambulatory Visit (INDEPENDENT_AMBULATORY_CARE_PROVIDER_SITE_OTHER): Payer: Medicare HMO | Admitting: Family Medicine

## 2020-02-21 VITALS — BP 138/86 | HR 89 | Ht 70.0 in | Wt 342.8 lb

## 2020-02-21 DIAGNOSIS — I2699 Other pulmonary embolism without acute cor pulmonale: Secondary | ICD-10-CM | POA: Diagnosis not present

## 2020-02-21 DIAGNOSIS — I10 Essential (primary) hypertension: Secondary | ICD-10-CM

## 2020-02-21 NOTE — Patient Instructions (Addendum)
Medication Instructions:  Continue all current medications.   Labwork: none  Testing/Procedures: none  Follow-Up: 6 months   Any Other Special Instructions Will Be Listed Below (If Applicable).   If you need a refill on your cardiac medications before your next appointment, please call your pharmacy.  

## 2020-03-01 ENCOUNTER — Ambulatory Visit (INDEPENDENT_AMBULATORY_CARE_PROVIDER_SITE_OTHER): Payer: Medicare HMO | Admitting: *Deleted

## 2020-03-01 DIAGNOSIS — Z86711 Personal history of pulmonary embolism: Secondary | ICD-10-CM

## 2020-03-01 DIAGNOSIS — Z5181 Encounter for therapeutic drug level monitoring: Secondary | ICD-10-CM | POA: Diagnosis not present

## 2020-03-01 DIAGNOSIS — I2699 Other pulmonary embolism without acute cor pulmonale: Secondary | ICD-10-CM

## 2020-03-01 LAB — POCT INR: INR: 3.3 — AB (ref 2.0–3.0)

## 2020-03-01 NOTE — Patient Instructions (Signed)
Hold warfarin tonight then resume 1 tablet daily except 1 1/2 tablets on Tuesdays and Saturdays Recheck in 3 wks

## 2020-03-07 ENCOUNTER — Ambulatory Visit: Payer: Medicaid Other | Admitting: General Surgery

## 2020-03-22 ENCOUNTER — Ambulatory Visit (INDEPENDENT_AMBULATORY_CARE_PROVIDER_SITE_OTHER): Payer: Medicare HMO | Admitting: *Deleted

## 2020-03-22 DIAGNOSIS — Z5181 Encounter for therapeutic drug level monitoring: Secondary | ICD-10-CM | POA: Diagnosis not present

## 2020-03-22 DIAGNOSIS — Z86711 Personal history of pulmonary embolism: Secondary | ICD-10-CM | POA: Diagnosis not present

## 2020-03-22 LAB — POCT INR: INR: 1.4 — AB (ref 2.0–3.0)

## 2020-03-22 NOTE — Patient Instructions (Signed)
Take warfarin 2 tablets tonight and tomorrow night then resume 1 tablet daily except 1 1/2 tablets on Tuesdays and Saturdays Recheck in 2 wks

## 2020-04-05 ENCOUNTER — Ambulatory Visit (INDEPENDENT_AMBULATORY_CARE_PROVIDER_SITE_OTHER): Payer: Medicare HMO | Admitting: *Deleted

## 2020-04-05 DIAGNOSIS — Z86711 Personal history of pulmonary embolism: Secondary | ICD-10-CM

## 2020-04-05 DIAGNOSIS — Z5181 Encounter for therapeutic drug level monitoring: Secondary | ICD-10-CM | POA: Diagnosis not present

## 2020-04-05 DIAGNOSIS — I2699 Other pulmonary embolism without acute cor pulmonale: Secondary | ICD-10-CM | POA: Diagnosis not present

## 2020-04-05 LAB — POCT INR: INR: 3.7 — AB (ref 2.0–3.0)

## 2020-04-05 NOTE — Patient Instructions (Signed)
Hold warfarin today then resume 1 tablet daily except 1 1/2 tablets on Tuesdays and Saturdays Recheck in 3 wks

## 2020-04-28 ENCOUNTER — Other Ambulatory Visit: Payer: Self-pay | Admitting: Cardiology

## 2020-04-28 DIAGNOSIS — I2602 Saddle embolus of pulmonary artery with acute cor pulmonale: Secondary | ICD-10-CM

## 2020-04-28 DIAGNOSIS — I2782 Chronic pulmonary embolism: Secondary | ICD-10-CM

## 2020-08-22 ENCOUNTER — Ambulatory Visit: Payer: Medicare HMO | Admitting: Cardiology

## 2020-08-22 NOTE — Progress Notes (Deleted)
Clinical Summary [name] [name] is a 45 y.o.female seen today for follow up of the following medical problems.    1. History of pulmonary embolism/Pulmonary HTN - history of unprovoked saddle PE in 04/2013. Followed by hematology, plans for lifelong anticoagulation. Has been on coumadin. From [facility] hematology notes did not recommend a DOAC, stating have not been well studied in patients over 250 lbs. - 04/2013 LVEF [id number], grade I diastolic dysfunction. Flattening of interventricular septum with mild RV dysfunction, PASP 53. -repeat CT did show near complete lysis of prior extensive PE.    - 08/2017 echo LVEF 60-65%, no WMAs, normal RV - no recent bleeding on coumadin       2. HTN - she is compliant with meds -prior note mentions lisinopril '10mg'$  was stopped in the past due to low bp's   3. Anemia - followed by hematology - on iron infusions.      Past Medical History:  Diagnosis Date   Abdominal cramps 08/30/2015   Anemia    Blood transfusion without reported diagnosis    Closed left ankle fracture    August 30 2012   Clotting disorder [facility])    DVT (deep venous thrombosis) (HCC)    L leg   Fibroid tumor    Hidradenitis    High blood pressure    Hypertension    Obesity    OSA on CPAP    Oxygen deficiency    Pregnancy    09/20/14 17 weeks   Pulmonary embolism ([place]) 04/2013   Sleep apnea    Tobacco use disorder      Allergies  Allergen Reactions   Sulfa Antibiotics Itching, Swelling and Rash    Lips swelling     Current Outpatient Medications  Medication Sig Dispense Refill   amLODipine (NORVASC) 10 MG tablet Take 1 tablet (10 mg total) by mouth daily. 90 tablet 3   DULoxetine (CYMBALTA) 60 MG capsule Take 1 capsule (60 mg total) by mouth daily. 30 capsule 2   ergocalciferol (VITAMIN D2) 1.25 MG (50000 UT) capsule Take 1 capsule (50,000 Units total) by mouth once a week. 16 capsule 4   triamterene-hydrochlorothiazide (MAXZIDE-25) 37.5-25 MG tablet TAKE 1 TABLET BY  MOUTH ONCE A DAY. 30 tablet 3   warfarin (COUMADIN) 10 MG tablet TAKE (1) OR (1) 1/2 TABLET BY MOUTH DAILY AS DIRECTED. 40 tablet 3   No current facility-administered medications for this visit.   Facility-Administered Medications Ordered in Other Visits  Medication Dose Route Frequency Provider Last Rate Last Admin   0.9 %  sodium chloride infusion   Intravenous Continuous Baird Cancer, PA-C 20 mL/hr at 12/22/15 1430 New Bag at 12/22/15 1430     Past Surgical History:  Procedure Laterality Date   ABSCESS DRAINAGE Bilateral 01/10/15   COLONOSCOPY WITH PROPOFOL N/A 06/08/2018   Procedure: COLONOSCOPY WITH PROPOFOL;  Surgeon: Daneil Dolin, MD;  [facility];  Service: Endoscopy;  Laterality: N/A;  2:30pm   CYST REMOVAL TRUNK     IVC FILTER PLACEMENT ([facility] HX)       Allergies  Allergen Reactions   Sulfa Antibiotics Itching, Swelling and Rash    Lips swelling      Family History  Problem Relation Age of Onset   Hypertension Mother    Diabetes Paternal Aunt    Diabetes Paternal Uncle    Other Father        blood clots   Cancer Maternal Grandmother    Heart  attack Paternal Grandmother    Colon cancer Neg Hx      Social History [name] [name] reports that she has been smoking cigarettes. She has a 4.50 pack-year smoking history. She has never used smokeless tobacco. [name] [name] reports current alcohol use.   Review of Systems CONSTITUTIONAL: No weight loss, fever, chills, weakness or fatigue.  HEENT: Eyes: No visual loss, blurred vision, double vision or yellow sclerae.No hearing loss, sneezing, congestion, runny nose or sore throat.  SKIN: No rash or itching.  CARDIOVASCULAR:  RESPIRATORY: No shortness of breath, cough or sputum.  GASTROINTESTINAL: No anorexia, nausea, vomiting or diarrhea. No abdominal pain or blood.  GENITOURINARY: No burning on urination, no polyuria NEUROLOGICAL: No headache, dizziness, syncope, paralysis, ataxia, numbness or tingling  in the extremities. No change in bowel or bladder control.  MUSCULOSKELETAL: No muscle, back pain, joint pain or stiffness.  LYMPHATICS: No enlarged nodes. No history of splenectomy.  PSYCHIATRIC: No history of depression or anxiety.  ENDOCRINOLOGIC: No reports of sweating, cold or heat intolerance. No polyuria or polydipsia.  Marland Kitchen   Physical Examination There were no vitals filed for this visit. There were no vitals filed for this visit.  Gen: resting comfortably, no acute distress HEENT: no scleral icterus, pupils equal round and reactive, no palptable cervical adenopathy,  CV Resp: Clear to auscultation bilaterally GI: abdomen is soft, non-tender, non-distended, normal bowel sounds, no hepatosplenomegaly MSK: extremities are warm, no edema.  Skin: warm, no rash Neuro:  no focal deficits Psych: appropriate affect   Diagnostic Studies  04/2013 echo Study Conclusions  - Study data: Technically difficult study. - Procedure narrative: Transthoracic echocardiography. Image   quality was adequate. The study was technically difficult,   as a result of body habitus. - Left ventricle: The cavity size was normal. Wall thickness   was increased in a pattern of moderate LVH. Systolic   function was vigorous. The estimated ejection fraction was   in the range of 65% to 70%. Wall motion was normal; there   were no regional wall motion abnormalities. Doppler   parameters are consistent with abnormal left ventricular   relaxation (grade 1 diastolic dysfunction). - Aortic valve: Valve area: 2.01cm^2(VTI). Valve area:   2.11cm^2 (Vmax). - Right ventricle: There is systolic flattening of the   interventricular septum consistent with RV pressure   overload. The cavity size was severely dilated. Systolic   function was mildly reduced. - Pulmonary arteries: Systolic pressure was moderately   increased. PA peak pressure: 70m Hg (S).     08/2017 echo Study Conclusions   - Left ventricle:  The cavity size was normal. Wall thickness was   increased in a pattern of mild LVH. Systolic function was normal.   The estimated ejection fraction was in the range of 60% to 65%.   Wall motion was normal; there were no regional wall motion   abnormalities. Left ventricular diastolic function parameters   were normal.   Assessment and Plan  1. History of pulmonary embolism/Pulmonary HTN - committed to lifelong anticoag, hematology has recommended coumadin over DOACs for her - needs f/u in coumadin clinic, we will arrange - continue current meds     2. HTN - she is at goal, continue currnet meds      JArnoldo Lenis M.D., F.A.C.C.

## 2020-08-24 ENCOUNTER — Telehealth: Payer: Self-pay | Admitting: Cardiology

## 2020-08-24 ENCOUNTER — Encounter: Payer: Self-pay | Admitting: Cardiology

## 2020-08-24 NOTE — Telephone Encounter (Signed)
Unable to reach patient about her missed appt. 08/22/2020. Will send a letter to patient

## 2020-09-06 ENCOUNTER — Telehealth: Payer: Self-pay | Admitting: *Deleted

## 2020-09-06 NOTE — Telephone Encounter (Signed)
09/06/20  Tried calling to reschedule INR appt.  Home # not working.  Cell # is wrong number.

## 2020-10-17 DIAGNOSIS — I2699 Other pulmonary embolism without acute cor pulmonale: Secondary | ICD-10-CM | POA: Diagnosis not present

## 2020-10-17 DIAGNOSIS — G894 Chronic pain syndrome: Secondary | ICD-10-CM | POA: Diagnosis not present

## 2020-10-17 DIAGNOSIS — I10 Essential (primary) hypertension: Secondary | ICD-10-CM | POA: Diagnosis not present

## 2020-10-17 DIAGNOSIS — Z23 Encounter for immunization: Secondary | ICD-10-CM | POA: Diagnosis not present

## 2020-10-18 DIAGNOSIS — Z23 Encounter for immunization: Secondary | ICD-10-CM | POA: Diagnosis not present

## 2020-10-18 DIAGNOSIS — I10 Essential (primary) hypertension: Secondary | ICD-10-CM | POA: Diagnosis not present

## 2020-10-18 DIAGNOSIS — I2699 Other pulmonary embolism without acute cor pulmonale: Secondary | ICD-10-CM | POA: Diagnosis not present

## 2020-10-18 DIAGNOSIS — G894 Chronic pain syndrome: Secondary | ICD-10-CM | POA: Diagnosis not present

## 2020-10-18 NOTE — Progress Notes (Addendum)
Cardiology Office Note  Date: 10/19/2020   ID: [name] [name], DOB 12-Nov-1975, MRN [id number]  PCP:  Rosita Fire, MD  Cardiologist:  Carlyle Dolly, MD Electrophysiologist:  None   Chief Complaint: Follow up 6 months  History of Present Illness: [name] [name] is a 45 y.o. female with a history of pulmonary embolism/pulmonary hypertension,HTN, anemia.  History of unprovoked saddle PE 2015. Plans were for lifelong anticoagulation. Had been on Coumadin. Repeat CT showed near complete lysis of prior extensive PE. Last echocardiogram 09/09/2017 echo with EF 60 to 65%, no WMA's, normal RV.   Last encounter with Dr Harl Bowie 02/12/2019 via telemedicine.. No bleeding on Coumadin. She was compliant with antihypertensive medication. Followed by hematology for anemia receiving iron infusions.  She is here today for 10-month follow-up.  States she has been doing well and has lost a significant amount of weight.  In January she weighed 342 pounds.  Today's weight is 305.  37 pound weight loss.  States she has been modifying her her diet and has stopped drinking soft drinks.  Eating better, exercising more.  Blood pressure reasonably well controlled today.  She states she has not taken her a.m. antihypertensive medications today.  She denies any DVT or PE-like symptoms.  INR today was 3.  She continues on Coumadin therapy without bleeding.  She has pending blood work ordered by hematology / oncology with CBC, complete metabolic panel and iron indices as well as vitamin D.  Her last CBC in our system was 12/14/2019 with hemoglobin of 8.5 and hematocrit of 32.5.  She has previously been receiving iron infusions.  She denies any anginal or exertional symptoms, orthostatic symptoms, CVA or TIA-like symptoms, bleeding.  Denies any claudication-like symptoms, or lower extremity edema.    Past Medical History:  Diagnosis Date   Abdominal cramps 08/30/2015   Anemia    Blood transfusion without reported  diagnosis    Closed left ankle fracture    August 30 2012   Clotting disorder [facility])    DVT (deep venous thrombosis) (HCC)    L leg   Fibroid tumor    Hidradenitis    High blood pressure    Hypertension    Obesity    OSA on CPAP    Oxygen deficiency    Pregnancy    09/20/14 17 weeks   Pulmonary embolism ([place]) 04/2013   Sleep apnea    Tobacco use disorder     Past Surgical History:  Procedure Laterality Date   ABSCESS DRAINAGE Bilateral 01/10/15   COLONOSCOPY WITH PROPOFOL N/A 06/08/2018   Procedure: COLONOSCOPY WITH PROPOFOL;  Surgeon: Daneil Dolin, MD;  [facility];  Service: Endoscopy;  Laterality: N/A;  2:30pm   CYST REMOVAL TRUNK     IVC FILTER PLACEMENT ([facility] HX)      Current Outpatient Medications  Medication Sig Dispense Refill   amLODipine (NORVASC) 10 MG tablet Take 1 tablet (10 mg total) by mouth daily. 90 tablet 3   triamterene-hydrochlorothiazide (MAXZIDE-25) 37.5-25 MG tablet TAKE 1 TABLET BY MOUTH ONCE A DAY. 30 tablet 3   warfarin (COUMADIN) 10 MG tablet TAKE (1) OR (1) 1/2 TABLET BY MOUTH DAILY AS DIRECTED. 40 tablet 3   No current facility-administered medications for this visit.   Facility-Administered Medications Ordered in Other Visits  Medication Dose Route Frequency Provider Last Rate Last Admin   0.9 %  sodium chloride infusion   Intravenous Continuous Baird Cancer, PA-C 20 mL/hr at 12/22/15 1430  New Bag at 12/22/15 1430   Allergies:  Sulfa antibiotics   Social History: The patient  reports that she has been smoking cigarettes. She has a 4.50 pack-year smoking history. She has never used smokeless tobacco. She reports current alcohol use. She reports that she does not use drugs.   Family History: The patient's family history includes Cancer in her maternal grandmother; Diabetes in her paternal aunt and paternal uncle; Heart attack in her paternal grandmother; Hypertension in her mother; Other in her father.   ROS:  Please see the  history of present illness. Otherwise, complete review of systems is positive for none.  All other systems are reviewed and negative.   Physical Exam: VS:  BP 130/88   Pulse 86   Ht 5\' 10"  (1.778 m)   Wt (!) 305 lb (138.3 kg)   SpO2 99%   BMI 43.76 kg/m , BMI Body mass index is 43.76 kg/m.  Wt Readings from Last 3 Encounters:  10/19/20 (!) 305 lb (138.3 kg)  02/21/20 (!) 342 lb 12.8 oz (155.5 kg)  11/25/19 (!) 324 lb (147 kg)    General: Morbidly obese patient appears comfortable at rest. Neck: Supple, no elevated JVP or carotid bruits, no thyromegaly. Lungs: Clear to auscultation, nonlabored breathing at rest. Cardiac: Regular rate and rhythm, no S3 or significant systolic murmur, no pericardial rub. Extremities: No pitting edema, distal pulses 2+. Skin: Warm and dry. Musculoskeletal: No kyphosis. Neuropsychiatric: Alert and oriented x3, affect grossly appropriate.  ECG: EKG 10/19/2020 normal sinus rhythm rate of 86.  Recent Labwork: 12/14/2019: ALT 10; AST 13; BUN 10; Creatinine, Ser 0.94; Hemoglobin 8.5; Platelets 406; Potassium 4.1; Sodium 135     Component Value Date/Time   CHOL 159 01/31/2017 1119   TRIG 73 01/31/2017 1119   HDL 51 01/31/2017 1119   CHOLHDL 3.1 01/31/2017 1119   LDLCALC 92 01/31/2017 1119    Other Studies Reviewed Today:   04/2013 echo Study Conclusions  - Study data: Technically difficult study. - Procedure narrative: Transthoracic echocardiography. Image   quality was adequate. The study was technically difficult,   as a result of body habitus. - Left ventricle: The cavity size was normal. Wall thickness   was increased in a pattern of moderate LVH. Systolic   function was vigorous. The estimated ejection fraction was   in the range of 65% to 70%. Wall motion was normal; there   were no regional wall motion abnormalities. Doppler   parameters are consistent with abnormal left ventricular   relaxation (grade 1 diastolic dysfunction). -  Aortic valve: Valve area: 2.01cm^2(VTI). Valve area:   2.11cm^2 (Vmax). - Right ventricle: There is systolic flattening of the   interventricular septum consistent with RV pressure   overload. The cavity size was severely dilated. Systolic   function was mildly reduced. - Pulmonary arteries: Systolic pressure was moderately   increased. PA peak pressure: 62mm Hg (S).     08/2017 echo Study Conclusions   - Left ventricle: The cavity size was normal. Wall thickness was   increased in a pattern of mild LVH. Systolic function was normal.   The estimated ejection fraction was in the range of 60% to 65%.   Wall motion was normal; there were no regional wall motion   abnormalities. Left ventricular diastolic function parameters   were normal.    Assessment and Plan:  1. Bilateral pulmonary embolism ([place])   2. Essential hypertension     1. Bilateral pulmonary embolism (HCC) No recent PE-like  symptoms.  No PND, no orthopnea no tachycardia, no unilateral leg swelling or claudication.  She is compliant with Coumadin therapy.  2. Essential hypertension Blood pressure 130/88 today.  She states she has not taken her morning antihypertensive medications yet.    Continue amlodipine 10 mg daily.  Continue Maxide 27.5/25 mg daily.  Medication Adjustments/Labs and Tests Ordered: Current medicines are reviewed at length with the patient today.  Concerns regarding medicines are outlined above.   Disposition: Follow-up with Dr. Harl Bowie or APP 6 months  Signed, Levell July, NP 10/19/2020 10:10 AM    [facility] [place], [place], [place] [address] Phone: [phone]; Fax: [phone]

## 2020-10-19 ENCOUNTER — Ambulatory Visit (INDEPENDENT_AMBULATORY_CARE_PROVIDER_SITE_OTHER): Payer: Medicare HMO | Admitting: *Deleted

## 2020-10-19 ENCOUNTER — Encounter: Payer: Self-pay | Admitting: Family Medicine

## 2020-10-19 ENCOUNTER — Other Ambulatory Visit (HOSPITAL_COMMUNITY): Payer: Self-pay

## 2020-10-19 ENCOUNTER — Ambulatory Visit (INDEPENDENT_AMBULATORY_CARE_PROVIDER_SITE_OTHER): Payer: Medicare HMO | Admitting: Family Medicine

## 2020-10-19 VITALS — BP 130/88 | HR 86 | Ht 70.0 in | Wt 305.0 lb

## 2020-10-19 DIAGNOSIS — I2699 Other pulmonary embolism without acute cor pulmonale: Secondary | ICD-10-CM | POA: Diagnosis not present

## 2020-10-19 DIAGNOSIS — Z5181 Encounter for therapeutic drug level monitoring: Secondary | ICD-10-CM

## 2020-10-19 DIAGNOSIS — E559 Vitamin D deficiency, unspecified: Secondary | ICD-10-CM

## 2020-10-19 DIAGNOSIS — Z86711 Personal history of pulmonary embolism: Secondary | ICD-10-CM

## 2020-10-19 DIAGNOSIS — I10 Essential (primary) hypertension: Secondary | ICD-10-CM | POA: Diagnosis not present

## 2020-10-19 DIAGNOSIS — D500 Iron deficiency anemia secondary to blood loss (chronic): Secondary | ICD-10-CM

## 2020-10-19 LAB — POCT INR: INR: 3 (ref 2.0–3.0)

## 2020-10-19 NOTE — Patient Instructions (Signed)

## 2020-10-19 NOTE — Patient Instructions (Signed)
Continue warfarin 1 tablet daily except 1 1/2 tablets on Tuesdays and Saturdays Recheck in 4 wks

## 2020-10-23 ENCOUNTER — Inpatient Hospital Stay (HOSPITAL_COMMUNITY): Payer: Medicare HMO | Attending: Hematology

## 2020-10-30 ENCOUNTER — Ambulatory Visit (HOSPITAL_COMMUNITY): Payer: Medicare HMO | Admitting: Physician Assistant

## 2021-01-03 ENCOUNTER — Telehealth: Payer: Self-pay | Admitting: Emergency Medicine

## 2021-01-03 NOTE — Telephone Encounter (Signed)
Left message for patient to call back. She has not been since 2019.

## 2021-01-11 NOTE — Telephone Encounter (Signed)
Called and spoke to pt. Pt states she has not been using O2 for about 2 years. Pt would like to re-establish care with Dr. Lamonte Sakai and potentially d/c O2. Appt scheduled for 02/15/21. Pt verbalized understanding and denied any further questions or concerns at this time.

## 2021-01-28 ENCOUNTER — Emergency Department (HOSPITAL_COMMUNITY)
Admission: EM | Admit: 2021-01-28 | Discharge: 2021-01-28 | Disposition: A | Discharge status: Home / Self Care | Payer: Medicare HMO | Attending: Emergency Medicine | Admitting: Emergency Medicine

## 2021-01-28 ENCOUNTER — Other Ambulatory Visit: Payer: Self-pay

## 2021-01-28 ENCOUNTER — Encounter (HOSPITAL_COMMUNITY): Payer: Self-pay | Admitting: *Deleted

## 2021-01-28 DIAGNOSIS — Z79899 Other long term (current) drug therapy: Secondary | ICD-10-CM | POA: Insufficient documentation

## 2021-01-28 DIAGNOSIS — M79605 Pain in left leg: Secondary | ICD-10-CM | POA: Insufficient documentation

## 2021-01-28 DIAGNOSIS — Z7901 Long term (current) use of anticoagulants: Secondary | ICD-10-CM | POA: Insufficient documentation

## 2021-01-28 LAB — PROTIME-INR
INR: 1.7 — ABNORMAL HIGH (ref 0.8–1.2)
Prothrombin Time: 20.2 seconds — ABNORMAL HIGH (ref 11.4–15.2)

## 2021-01-28 MED ORDER — CYCLOBENZAPRINE HCL 10 MG PO TABS
10.0000 mg | ORAL_TABLET | Freq: Once | ORAL | Status: AC
  Administered 2021-01-28: 10 mg via ORAL
  Filled 2021-01-28: qty 1

## 2021-01-28 MED ORDER — CYCLOBENZAPRINE HCL 10 MG PO TABS: 10.0000 mg | ORAL_TABLET | Freq: Two times a day (BID) | ORAL | 0 refills | Status: DC | PRN

## 2021-01-28 NOTE — Discharge Instructions (Signed)
Lab work and exam are reassuring, I suspect this is a muscular strain have given you a muscle relaxer please take as prescribed.  It is possible that you might have a DVT so I would like you to come back tomorrow for a ultrasound, please call the number to schedule your appointment.  Come back to the emergency department if you develop chest pain, shortness of breath, severe abdominal pain, uncontrolled nausea, vomiting, diarrhea.

## 2021-01-28 NOTE — ED Notes (Signed)
Pt admits to missing two doses last week of her coumadin .

## 2021-01-28 NOTE — ED Triage Notes (Signed)
Pt with hx of DVT to left leg in the past.  Pt reports she takes warfarin.  Pt began to have same pain to left thigh that radiates up to left hip yesterday.

## 2021-01-28 NOTE — ED Provider Notes (Signed)
[facility] EMERGENCY DEPARTMENT Provider Note   CSN: [id number] Arrival date & time: 01/28/21  1342     History  Chief Complaint  Patient presents with   Leg Pain    [name] [name] is a 46 y.o. female.  HPI  Patient with medical history including PE, DVT currently on warfarin, obesity, presents with chief complaint of left leg pain.  Patient's pain started yesterday, started after she was helping dress her daughter, she twisted her leg and started to have severe pain. pain remains in her left anterior thigh will go up into her left hip, pain has remained constant, is worsen with movement, improved with rest, she denies paresthesia or weakness in lower extremities, denies peripheral edema, denies any chest pain, shortness of breath, states that she did miss 2 doses of her warfarin last week but has been consistent after that, she is not had her INR checked in a while, she has no other complaints at this time.  Home Medications Prior to Admission medications   Medication Sig Start Date End Date Taking? Authorizing Provider  cyclobenzaprine (FLEXERIL) 10 MG tablet Take 1 tablet (10 mg total) by mouth 2 (two) times daily as needed for muscle spasms. 01/28/21  Yes Marcello Fennel, PA-C  amLODipine (NORVASC) 10 MG tablet Take 1 tablet (10 mg total) by mouth daily. 01/31/17   Caren Macadam, MD  triamterene-hydrochlorothiazide (MAXZIDE-25) 37.5-25 MG tablet TAKE 1 TABLET BY MOUTH ONCE A DAY. 06/20/17   Caren Macadam, MD  warfarin (COUMADIN) 10 MG tablet TAKE (1) OR (1) 1/2 TABLET BY MOUTH DAILY AS DIRECTED. 05/01/20   Arnoldo Lenis, MD      Allergies    Sulfa antibiotics    Review of Systems   Review of Systems  Constitutional:  Negative for chills and fever.  Respiratory:  Negative for shortness of breath.   Cardiovascular:  Negative for chest pain.  Gastrointestinal:  Negative for abdominal pain.  Musculoskeletal:        Left leg pain.  Neurological:  Negative for headaches.    Physical Exam Updated Vital Signs BP (!) 145/88 (BP Location: Right Arm)    Pulse 82    Temp 98.2 F (36.8 C) (Oral)    Resp 18    Ht 5\' 10"  (1.778 m)    Wt (!) 148.3 kg    LMP 12/31/2020    SpO2 100%    BMI 46.92 kg/m  Physical Exam Vitals and nursing note reviewed.  Constitutional:      General: She is not in acute distress.    Appearance: She is not ill-appearing.  HENT:     Head: Normocephalic and atraumatic.     Nose: No congestion.  Eyes:     Conjunctiva/sclera: Conjunctivae normal.  Cardiovascular:     Rate and Rhythm: Normal rate and regular rhythm.     Pulses: Normal pulses.  Pulmonary:     Effort: Pulmonary effort is normal.  Musculoskeletal:     Comments: Lower extremities were visualized no gross abnormalities present, no peripheral edema present, she has full range of motion in her ankles knee and hip, neurovascular fully intact, she is notably tender on the anterior aspect of her thigh, no deformities or palpable mass present.  Skin:    General: Skin is warm and dry.  Neurological:     Mental Status: She is alert.  Psychiatric:        Mood and Affect: Mood normal.    ED Results / Procedures /  Treatments   Labs (all labs ordered are listed, but only abnormal results are displayed) Labs Reviewed  PROTIME-INR - Abnormal; Notable for the following components:      Result Value   Prothrombin Time 20.2 (*)    INR 1.7 (*)    All other components within normal limits    EKG None  Radiology No results found.  Procedures Procedures    Medications Ordered in ED Medications  cyclobenzaprine (FLEXERIL) tablet 10 mg (10 mg Oral Given 01/28/21 1423)    ED Course/ Medical Decision Making/ A&P                           Medical Decision Making  This patient presents to the ED for concern of left leg pain, this involves an extensive number of treatment options, and is a complaint that carries with it a high risk of complications and morbidity.  The  differential diagnosis includes DVT, infection    Additional history obtained:  Additional history obtained from electronic medical record    Co morbidities that complicate the patient evaluation  N/A   Social Determinants of Health:  N/A    Lab Tests:  I Ordered, and personally interpreted labs.  The pertinent results include: Prothrombin time 20.2 INR 1.7   Test Considered:  Will defer imaging of the left leg as there is no traumatic injury associated this pain, there is no gross varus present, very low suspicion for fracture at this time.    Rule out I have low suspicion for septic arthritis as patient denies IV drug use, skin exam was performed no erythematous, edematous, warm joints noted on exam.  low suspicion for tendon damage as area was palpated no gross defects noted, they had full range of motion as well as 5/5 strength.  Low suspicion for compartment syndrome as area was palpated it was soft to the touch, neurovascular fully intact.  I have low suspicion for DVT as presentation atypical, pain is on the anterior aspect of the thigh, there is no palpable masses present, she is also on warfarin making this less likely.  Low suspicion for PE as she denies pleuritic chest pain, shortness of breath, patient is PERC negative.     Dispostion and problem list  After consideration of the diagnostic results and the patients response to treatment, I feel that the patent would benefit from   Left leg pain-I suspect this is mainly muscular in nature as it happened after she twisted her knee, but due to her history of DVTs we will have her come back tomorrow for a DVT study, will recommend over-the-counter pain medications given strict return precautions.             Final Clinical Impression(s) / ED Diagnoses Final diagnoses:  Left leg pain    Rx / DC Orders ED Discharge Orders          Ordered    cyclobenzaprine (FLEXERIL) 10 MG tablet  2 times daily PRN         01/28/21 1505    US Venous Img Lower Unilateral Left        01/28/21 1505              Marcello Fennel, PA-C 01/28/21 [place], Ankit, MD 01/28/21 1539

## 2021-01-30 ENCOUNTER — Other Ambulatory Visit: Payer: Self-pay

## 2021-01-30 ENCOUNTER — Emergency Department (HOSPITAL_COMMUNITY)
Admission: EM | Admit: 2021-01-30 | Discharge: 2021-01-30 | Disposition: A | Discharge status: Home / Self Care | Payer: Medicare HMO | Attending: Emergency Medicine | Admitting: Emergency Medicine

## 2021-01-30 ENCOUNTER — Emergency Department (HOSPITAL_COMMUNITY): Payer: Medicare HMO

## 2021-01-30 ENCOUNTER — Encounter (HOSPITAL_COMMUNITY): Payer: Self-pay | Admitting: *Deleted

## 2021-01-30 ENCOUNTER — Ambulatory Visit (HOSPITAL_COMMUNITY)
Admission: RE | Admit: 2021-01-30 | Discharge: 2021-01-30 | Disposition: A | Discharge status: Home / Self Care | Payer: Medicare HMO | Source: Ambulatory Visit | Attending: Student | Admitting: Student

## 2021-01-30 DIAGNOSIS — Z7901 Long term (current) use of anticoagulants: Secondary | ICD-10-CM | POA: Diagnosis not present

## 2021-01-30 DIAGNOSIS — K7689 Other specified diseases of liver: Secondary | ICD-10-CM | POA: Diagnosis not present

## 2021-01-30 DIAGNOSIS — I82412 Acute embolism and thrombosis of left femoral vein: Secondary | ICD-10-CM

## 2021-01-30 DIAGNOSIS — Z86718 Personal history of other venous thrombosis and embolism: Secondary | ICD-10-CM | POA: Insufficient documentation

## 2021-01-30 DIAGNOSIS — M79652 Pain in left thigh: Secondary | ICD-10-CM | POA: Diagnosis present

## 2021-01-30 DIAGNOSIS — I82432 Acute embolism and thrombosis of left popliteal vein: Secondary | ICD-10-CM | POA: Insufficient documentation

## 2021-01-30 DIAGNOSIS — M79605 Pain in left leg: Secondary | ICD-10-CM | POA: Insufficient documentation

## 2021-01-30 LAB — CBC WITH DIFFERENTIAL/PLATELET
Abs Immature Granulocytes: 0.01 10*3/uL (ref 0.00–0.07)
Basophils Absolute: 0.1 10*3/uL (ref 0.0–0.1)
Basophils Relative: 1 %
Eosinophils Absolute: 0.1 10*3/uL (ref 0.0–0.5)
Eosinophils Relative: 3 %
HCT: 34.7 % — ABNORMAL LOW (ref 36.0–46.0)
Hemoglobin: 9.2 g/dL — ABNORMAL LOW (ref 12.0–15.0)
Immature Granulocytes: 0 %
Lymphocytes Relative: 34 %
Lymphs Abs: 1.5 10*3/uL (ref 0.7–4.0)
MCH: 18.7 pg — ABNORMAL LOW (ref 26.0–34.0)
MCHC: 26.5 g/dL — ABNORMAL LOW (ref 30.0–36.0)
MCV: 70.5 fL — ABNORMAL LOW (ref 80.0–100.0)
Monocytes Absolute: 0.4 10*3/uL (ref 0.1–1.0)
Monocytes Relative: 8 %
Neutro Abs: 2.5 10*3/uL (ref 1.7–7.7)
Neutrophils Relative %: 54 %
Platelets: 325 10*3/uL (ref 150–400)
RBC: 4.92 MIL/uL (ref 3.87–5.11)
RDW: 22.9 % — ABNORMAL HIGH (ref 11.5–15.5)
WBC: 4.6 10*3/uL (ref 4.0–10.5)
nRBC: 0 % (ref 0.0–0.2)

## 2021-01-30 LAB — BASIC METABOLIC PANEL
Anion gap: 12 (ref 5–15)
BUN: 15 mg/dL (ref 6–20)
CO2: 21 mmol/L — ABNORMAL LOW (ref 22–32)
Calcium: 9.5 mg/dL (ref 8.9–10.3)
Chloride: 103 mmol/L (ref 98–111)
Creatinine, Ser: 0.91 mg/dL (ref 0.44–1.00)
GFR, Estimated: >60 mL/min (ref >60)
Glucose, Bld: 89 mg/dL (ref 70–99)
Potassium: 3.5 mmol/L (ref 3.5–5.1)
Sodium: 136 mmol/L (ref 135–145)

## 2021-01-30 LAB — PROTIME-INR
INR: 2.7 — ABNORMAL HIGH (ref 0.8–1.2)
Prothrombin Time: 28.9 seconds — ABNORMAL HIGH (ref 11.4–15.2)

## 2021-01-30 MED ORDER — IOHEXOL 300 MG/ML  SOLN
100.0000 mL | Freq: Once | INTRAMUSCULAR | Status: AC | PRN
  Administered 2021-01-30: 100 mL via INTRAVENOUS

## 2021-01-30 MED ORDER — OXYCODONE-ACETAMINOPHEN 5-325 MG PO TABS
1.0000 | ORAL_TABLET | Freq: Once | ORAL | Status: AC
  Administered 2021-01-30: 1 via ORAL
  Filled 2021-01-30: qty 1

## 2021-01-30 NOTE — Discharge Instructions (Addendum)
Continue taking your Coumadin as directed.  Someone from Dr. Stephens Shire office may contact you tomorrow for appointment time to be seen in his office.  You may also call his office in the morning to arrange follow-up for tomorrow.

## 2021-01-30 NOTE — ED Provider Notes (Signed)
[facility] EMERGENCY DEPARTMENT Provider Note   CSN: [id number] Arrival date & time: 01/30/21  1233     History  Chief Complaint  Patient presents with   Leg Pain    [name] [name] is a 46 y.o. female.   Leg Pain Associated symptoms: no back pain        [name] [name] is a 46 y.o. female with past medical history of clotting disorder with previous DVTs in bilateral PEs and has a IVC filter in place currently anticoagulated on warfarin.  She was seen here on 01/28/2021 for pain to her lateral left thigh.  Pain has been present for 3 days.  She does endorse two missed doses of her warfarin prior to onset of thigh pain.  She returned today for scheduled outpatient venous imaging of the extremity and was found to have an acute DVT that extended from the left popliteal through the common femoral vein.  The extent of the central thrombus to the inguinal area was unable to to be determined on ultrasound and CT venogram of abdomen and pelvis was recommended. Pt denies numbness of the extremity, calf pain or swelling.  No shortness of breath.    Home Medications Prior to Admission medications   Medication Sig Start Date End Date Taking? Authorizing Provider  amLODipine (NORVASC) 10 MG tablet Take 1 tablet (10 mg total) by mouth daily. 01/31/17   Caren Macadam, MD  cyclobenzaprine (FLEXERIL) 10 MG tablet Take 1 tablet (10 mg total) by mouth 2 (two) times daily as needed for muscle spasms. 01/28/21   Marcello Fennel, PA-C  triamterene-hydrochlorothiazide (MAXZIDE-25) 37.5-25 MG tablet TAKE 1 TABLET BY MOUTH ONCE A DAY. 06/20/17   Caren Macadam, MD  warfarin (COUMADIN) 10 MG tablet TAKE (1) OR (1) 1/2 TABLET BY MOUTH DAILY AS DIRECTED. 05/01/20   Arnoldo Lenis, MD      Allergies    Sulfa antibiotics    Review of Systems   Review of Systems  Musculoskeletal:  Positive for myalgias (Left thigh pain). Negative for back pain.  Skin:  Negative for color change and wound.   Neurological:  Negative for weakness and numbness.  All other systems reviewed and are negative.  Physical Exam Updated Vital Signs BP (!) 143/75    Pulse 88    Temp 98.3 F (36.8 C) (Oral)    Resp 17    LMP 12/31/2020    SpO2 100%  Physical Exam Vitals and nursing note reviewed.  Constitutional:      General: She is not in acute distress.    Appearance: Normal appearance. She is not toxic-appearing.  Cardiovascular:     Rate and Rhythm: Normal rate and regular rhythm.     Pulses: Normal pulses.     Comments: Patient has palpable dorsalis and posterior pedal pulses bilaterally Pulmonary:     Effort: Pulmonary effort is normal.  Abdominal:     Palpations: Abdomen is soft.     Tenderness: There is no abdominal tenderness.  Musculoskeletal:        General: Tenderness present.     Comments: Tenderness to the lateral aspect of the left thigh.  No appreciable edema no calf swelling or tenderness  Skin:    General: Skin is warm.     Capillary Refill: Capillary refill takes less than 2 seconds.     Findings: No erythema.  Neurological:     General: No focal deficit present.     Mental Status: She is alert.  ED Results / Procedures / Treatments   Labs (all labs ordered are listed, but only abnormal results are displayed) Labs Reviewed  PROTIME-INR - Abnormal; Notable for the following components:      Result Value   Prothrombin Time 28.9 (*)    INR 2.7 (*)    All other components within normal limits  CBC WITH DIFFERENTIAL/PLATELET - Abnormal; Notable for the following components:   Hemoglobin 9.2 (*)    HCT 34.7 (*)    MCV 70.5 (*)    MCH 18.7 (*)    MCHC 26.5 (*)    RDW 22.9 (*)    All other components within normal limits  BASIC METABOLIC PANEL - Abnormal; Notable for the following components:   CO2 21 (*)    All other components within normal limits    EKG None  Radiology US Venous Img Lower Unilateral Left  Addendum Date: 01/30/2021   ADDENDUM REPORT:  01/30/2021 12:13 ADDENDUM: These results were called by telephone at the time of interpretation on 01/30/2021 at 12:12 pm to provider Kem Parkinson, PA , who verbally acknowledged these results. Electronically Signed   By: Ruthann Cancer M.D.   On: 01/30/2021 12:13   Result Date: 01/30/2021 CLINICAL DATA:  46 year old female with history of DVT with left leg pain. EXAM: LEFT LOWER EXTREMITY VENOUS DOPPLER ULTRASOUND TECHNIQUE: Gray-scale sonography with graded compression, as well as color Doppler and duplex ultrasound were performed to evaluate the left lower extremity deep venous systems from the level of the common femoral vein and including the common femoral, femoral, profunda femoral, popliteal and calf veins including the posterior tibial, peroneal and gastrocnemius veins when visible. Spectral Doppler was utilized to evaluate flow at rest and with distal augmentation maneuvers in the common femoral, femoral and popliteal veins. The contralateral common femoral vein was also evaluated for comparison. COMPARISON:  04/03/2015 FINDINGS: LEFT LOWER EXTREMITY Common Femoral Vein: Nearly occlusive, expansile hypoechoic thrombus throughout. Central Greater Saphenous Vein: No evidence of thrombus. Normal compressibility and flow on color Doppler imaging. Central Profunda Femoral Vein: Nearly occlusive, expansile hypoechoic thrombus throughout. Femoral Vein: Nonocclusive, expansile hypoechoic thrombus throughout. Popliteal Vein: Nonocclusive, expansile hypoechoic thrombus throughout. Calf Veins: Limited visualization. Other Findings:  None. RIGHT LOWER EXTREMITY Common Femoral Vein: No evidence of thrombus. Normal compressibility, respiratory phasicity and response to augmentation. IMPRESSION: Acute, nearly occlusive thrombus extending from the left popliteal vein through the common femoral vein. Extent of thrombus central to the inguinal ligament is unable to be determined on this study. Poor visualization of the  calf veins. Recommend CTV abdomen pelvis to evaluate for central extent of left lower extremity deep vein thrombosis. If thrombus extends into the pelvis, recommend consultation to Interventional Radiology or other qualified endovascular specialist. Ruthann Cancer, MD [facility] Radiology Electronically Signed: By: Ruthann Cancer M.D. On: 01/30/2021 12:04   CT VENOGRAM ABD/PEL  Result Date: 01/30/2021 CLINICAL DATA:  Deep venous thrombosis (DVT) acute DVT lower extremity extends through common femoral EXAM: CT VENOGRAM ABDOMEN AND PELVIS TECHNIQUE: Multidetector CT imaging of the abdomen was performed using the standard protocol following bolus administration of intravenous contrast during both the portal venous and standard venous phases. RADIATION DOSE REDUCTION: This exam was performed according to the departmental dose-optimization program which includes automated exposure control, adjustment of the mA and/or kV according to patient size and/or use of iterative reconstruction technique. CONTRAST:  182mL OMNIPAQUE IOHEXOL 300 MG/ML  SOLN COMPARISON:  None. FINDINGS: Inferior chest: The lung bases  are well-aerated. Hepatobiliary: The liver is normal in size. Small geographic hypodensity in the posterior right hepatic lobe, indeterminate but may represent small infarct or other benign lesion. No intrahepatic or extrahepatic biliary ductal dilation. The gallbladder appears normal. Spleen: Normal in size without focal abnormality. Pancreas: No pancreatic ductal dilatation or surrounding inflammatory changes. Adrenals/Urinary Tract: Adrenal glands are unremarkable. Kidneys are normal, without renal calculi, focal lesion, or hydronephrosis. Bladder is unremarkable. Stomach/Bowel: The stomach, small bowel and large bowel are normal in caliber without abnormal wall thickening or surrounding inflammatory changes. The appendix is normal. Reproductive: The uterus appears  enlarged. No suspicious adnexal masses. Lymphatic: No enlarged lymph nodes in the abdomen or pelvis. Vasculature: Infrarenal IVC filter is present. The IVC is patent. The bilateral common iliac veins are patent. There is expansile deep venous thrombosis involving the visualized left femoral veins which extends into a narrowed left external iliac vein. The abdominal aorta is normal in caliber. The portal venous system is patent. Other: No abdominopelvic ascites. Musculoskeletal: No aggressive osseous lesions. Degenerative changes of the lower lumbar spine including grade 1 anterolisthesis of L4 on L5 and disc space disease at L5-S1. The soft tissues are unremarkable. IMPRESSION: 1. There is acute appearing DVT involving the left femoral veins extending into the distal left external iliac vein which appears to become significantly narrowed in its midportion. It is unclear if the narrowing of the external iliac vein at this level is due to scarring from previous DVT, versus possible extrinsic compression from an adjacent enlarged uterus. Consultation with interventional radiology/endovascular specialist is recommended. 2. Infrarenal IVC filter is present with a patent IVC and common iliac veins. 3. Enlarged appearance of the uterus, likely due to underlying fibroids although not well evaluated on this exam. Nonemergent ultrasound of the pelvis is recommended for further characterization. Electronically Signed   By: Albin Felling M.D.   On: 01/30/2021 16:03    Procedures Procedures    Medications Ordered in ED Medications  oxyCODONE-acetaminophen (PERCOCET/ROXICET) 5-325 MG per tablet 1 tablet (has no administration in time range)  iohexol (OMNIPAQUE) 300 MG/ML solution 100 mL (100 mLs Intravenous Contrast Given 01/30/21 1538)    ED Course/ Medical Decision Making/ A&P                           Medical Decision Making  Patient who is chronically anticoagulated with warfarin due to history of PE and DVTs  from 2015.  She has IVC filter in place. She was seen here 2 days ago for pain of her left thigh and outpatient venous ultrasound of the extremity was ordered.  She returns today for the scheduled imaging.  Continues to have pain of her lateral left thigh that worsens with walking.  Patient missed 2 doses of her warfarin prior to onset of leg pain.  No known injury.  She is followed by local cardiology and Coumadin clinic in [place].  Patient had venous imaging of the extremity today that shows acute DVT from the popliteal to the common femoral vein.  CT venogram abdomen pelvis was recommended for further evaluation of the central portion of the thrombus.  CT venogram shows acute DVT of the femoral veins extending to the distal left external iliac with significant narrowing in the midportion.  I Agree with radiology interpretation.  Labs without evidence of leukocytosis.  Hemoglobin 9.2, similar to baseline.  Electrolytes unremarkable.  Patient's INR is 2.7.  Will consult with vascular surgery for  further recommendation.  Discussed findings with vascular surgery, Dr. Trula Slade and he will see patient in clinic tomorrow (01/31/21) patient will continue her regular Coumadin dosing.  Discussed plan with patient and she is agreeable.  She appears appropriate for discharge home, all questions were answered.        Final Clinical Impression(s) / ED Diagnoses Final diagnoses:  Acute deep vein thrombosis (DVT) of femoral vein of left lower extremity [facility])    Rx / DC Orders ED Discharge Orders     None         Kem Parkinson, PA-C 01/30/21 2020    Godfrey Pick, MD 01/31/21 [id number]

## 2021-01-30 NOTE — ED Triage Notes (Signed)
Left leg pain had ultrasound this am and advised to have additional imaging

## 2021-01-30 NOTE — ED Provider Notes (Signed)
Patient returned here today for outpatient scheduled venous imaging of the left lower extremity.  Patient was seen here on 01/28/2021 for left thigh pain.  Patient has history of clotting disorder with previous DVT and PE.  She has IVC filter in place since 2015.  Currently anticoagulated with Coumadin.  Admits to missing 2 doses of her Coumadin last week.  Left leg pain began 2 days ago.  Ultrasound study today shows an acute nearly occlusive thrombus from the left popliteal vein that extends through the common femoral vein.  Radiology called with interpretation.  Recommending CT venogram of the abdomen and pelvis to further evaluate extent of the thrombosis.  I will have checked back into the emergency department for further evaluation.  She will likely need consultation with IR or vascular surgery.  I have discussed ultrasound results with the patient and explained need for further evaluation.  She verbalized understanding agrees to plan.  US Venous Img Lower Unilateral Left  Addendum Date: 01/30/2021   ADDENDUM REPORT: 01/30/2021 12:13 ADDENDUM: These results were called by telephone at the time of interpretation on 01/30/2021 at 12:12 pm to provider Kem Parkinson, PA , who verbally acknowledged these results. Electronically Signed   By: Ruthann Cancer M.D.   On: 01/30/2021 12:13   Result Date: 01/30/2021 CLINICAL DATA:  46 year old female with history of DVT with left leg pain. EXAM: LEFT LOWER EXTREMITY VENOUS DOPPLER ULTRASOUND TECHNIQUE: Gray-scale sonography with graded compression, as well as color Doppler and duplex ultrasound were performed to evaluate the left lower extremity deep venous systems from the level of the common femoral vein and including the common femoral, femoral, profunda femoral, popliteal and calf veins including the posterior tibial, peroneal and gastrocnemius veins when visible. Spectral Doppler was utilized to evaluate flow at rest and with distal augmentation maneuvers in  the common femoral, femoral and popliteal veins. The contralateral common femoral vein was also evaluated for comparison. COMPARISON:  04/03/2015 FINDINGS: LEFT LOWER EXTREMITY Common Femoral Vein: Nearly occlusive, expansile hypoechoic thrombus throughout. Central Greater Saphenous Vein: No evidence of thrombus. Normal compressibility and flow on color Doppler imaging. Central Profunda Femoral Vein: Nearly occlusive, expansile hypoechoic thrombus throughout. Femoral Vein: Nonocclusive, expansile hypoechoic thrombus throughout. Popliteal Vein: Nonocclusive, expansile hypoechoic thrombus throughout. Calf Veins: Limited visualization. Other Findings:  None. RIGHT LOWER EXTREMITY Common Femoral Vein: No evidence of thrombus. Normal compressibility, respiratory phasicity and response to augmentation. IMPRESSION: Acute, nearly occlusive thrombus extending from the left popliteal vein through the common femoral vein. Extent of thrombus central to the inguinal ligament is unable to be determined on this study. Poor visualization of the calf veins. Recommend CTV abdomen pelvis to evaluate for central extent of left lower extremity deep vein thrombosis. If thrombus extends into the pelvis, recommend consultation to Interventional Radiology or other qualified endovascular specialist. Ruthann Cancer, MD [facility] Radiology Electronically Signed: By: Ruthann Cancer M.D. On: 01/30/2021 12:04      Kem Parkinson, PA-C 01/30/21 [place], Debe Coder, MD 01/30/21 [phone]

## 2021-02-01 ENCOUNTER — Encounter: Payer: Medicare HMO | Admitting: Vascular Surgery

## 2021-02-01 NOTE — Progress Notes (Signed)
Office Note     CC: Left lower extremity DVT Requesting Provider:  Rosita Fire, MD  HPI: [name] [name] is a 46 y.o. (Aug 15, 1975) female presenting in follow-up after recent emergency department evaluation for left lower extremity swelling.  Imaging on 01/30/2021 demonstrated acute DVT extending from the distal left external iliac through the popliteal vein.  Prior to her presentation, [name] [name] said she had missed 2 doses of warfarin.  INR on 01/28/2021 was subtherapeutic.  She currently follows with the [facility]. Thrombophilia workup negative, with only antithrombin levels not sent.  [name] [name] has a history of unprovoked bilateral DVT resulting in bilateral PE, and currently has an IVC filter.  On exam today, [name] [name] continues to complain of left leg pain.  This is appreciated when she is walking and at rest.  Pain is mainly in her thigh.  As her discharge she has been diligent in taking her warfarin, as well as pain medication in an effort to sleep. She denies numbness and tingling at the toes, symptoms of claudication, ischemic rest pain, tissue loss.  The pt is not on a statin for cholesterol management.  The pt is not on a daily aspirin.   Other AC:  warfarin The pt is  on medication for hypertension.   The pt is not diabetic.  Tobacco hx:  -  Past Medical History:  Diagnosis Date   Abdominal cramps 08/30/2015   Anemia    Blood transfusion without reported diagnosis    Closed left ankle fracture    August 30 2012   Clotting disorder [facility])    DVT (deep venous thrombosis) (HCC)    L leg   Fibroid tumor    Hidradenitis    High blood pressure    Hypertension    Obesity    OSA on CPAP    Oxygen deficiency    Pregnancy    09/20/14 17 weeks   Pulmonary embolism ([place]) 04/2013   Sleep apnea    Tobacco use disorder     Past Surgical History:  Procedure Laterality Date   ABSCESS DRAINAGE Bilateral 01/10/15   COLONOSCOPY WITH PROPOFOL N/A 06/08/2018   Procedure:  COLONOSCOPY WITH PROPOFOL;  Surgeon: Daneil Dolin, MD;  Location: [facility] SUITE;  Service: Endoscopy;  Laterality: N/A;  2:30pm   CYST REMOVAL TRUNK     IVC FILTER PLACEMENT ([place] [facility])      Social History   Socioeconomic History   Marital status: Widowed    Spouse name: Not on file   Number of children: Not on file   Years of education: Not on file   Highest education level: Not on file  Occupational History   Not on file  Tobacco Use   Smoking status: Some Days    Packs/day: 0.25    Years: 18.00    Pack years: 4.50    Types: Cigarettes   Smokeless tobacco: Never   Tobacco comments:    smokes 3 cig when she smokes  Vaping Use   Vaping Use: Never used  Substance and Sexual Activity   Alcohol use: Yes    Alcohol/week: 0.0 standard drinks    Comment: twice a month   Drug use: No   Sexual activity: Not Currently    Birth control/protection: Pill  Other Topics Concern   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    Has a 45 year old Chauvin.  Live with parents.   Was working 5 days a weeks.   Not working right now.    Attends church.    Social Determinants of Health   Financial Resource Strain: Not on file  Food Insecurity: Not on file  Transportation Needs: Not on file  Physical Activity: Not on file  Stress: Not on file  Social Connections: Not on file  Intimate Partner Violence: Not on file    Family History  Problem Relation Age of Onset   Hypertension Mother    Diabetes Paternal Aunt    Diabetes Paternal Uncle    Other Father        blood clots   Cancer Maternal Grandmother    Heart attack Paternal Grandmother    Colon cancer Neg Hx     Current Outpatient Medications  Medication Sig Dispense Refill   amLODipine (NORVASC) 10 MG tablet Take 1 tablet (10 mg total) by mouth daily. 90 tablet 3   cyclobenzaprine (FLEXERIL) 10 MG tablet Take 1 tablet (10 mg total) by mouth 2 (two) times daily as  needed for muscle spasms. 20 tablet 0   triamterene-hydrochlorothiazide (MAXZIDE-25) 37.5-25 MG tablet TAKE 1 TABLET BY MOUTH ONCE A DAY. 30 tablet 3   warfarin (COUMADIN) 10 MG tablet TAKE (1) OR (1) 1/2 TABLET BY MOUTH DAILY AS DIRECTED. 40 tablet 3   No current facility-administered medications for this visit.   Facility-Administered Medications Ordered in Other Visits  Medication Dose Route Frequency Provider Last Rate Last Admin   0.9 %  sodium chloride infusion   Intravenous Continuous Baird Cancer, PA-C 20 mL/hr at 12/22/15 1430 New Bag at 12/22/15 1430    Allergies  Allergen Reactions   Sulfa Antibiotics Itching, Swelling and Rash    Lips swelling     REVIEW OF SYSTEMS:   [X]  denotes positive finding, [ ]  denotes negative finding Cardiac  Comments:  Chest pain or chest pressure:    Shortness of breath upon exertion:    Short of breath when lying flat:    Irregular heart rhythm:        Vascular    Pain in calf, thigh, or hip brought on by ambulation:    Pain in feet at night that wakes you up from your sleep:     Blood clot in your veins:    Leg swelling:         Pulmonary    Oxygen at home:    Productive cough:     Wheezing:         Neurologic    Sudden weakness in arms or legs:     Sudden numbness in arms or legs:     Sudden onset of difficulty speaking or slurred speech:    Temporary loss of vision in one eye:     Problems with dizziness:         Gastrointestinal    Blood in stool:     Vomited blood:         Genitourinary    Burning when urinating:     Blood in urine:        Psychiatric    Major depression:         Hematologic    Bleeding problems:    Problems with blood clotting too easily:        Skin    Rashes or ulcers:        Constitutional    Fever or chills:      PHYSICAL EXAMINATION:  There were no vitals filed for this visit.  General:  WDWN in NAD; vital signs documented above Gait: Not observed HENT: WNL,  normocephalic Pulmonary: normal non-labored breathing , without wheezing Cardiac: regular HR, Abdomen: soft, NT, no masses Skin: without rashes Vascular Exam/Pulses:  Right Left                  DP 2+ (normal) 2+ (normal)  PT trace trace   Extremities: without ischemic changes, without Gangrene , without cellulitis; without open wounds; obese Musculoskeletal: no muscle wasting or atrophy  Neurologic: A&O X 3;  No focal weakness or paresthesias are detected Psychiatric:  The pt has Normal affect.   Non-Invasive Vascular Imaging:   Invasive vascular imaging was independently reviewed demonstrating compression of the external iliac vein, likely from uterine fibroid, thrombotic occlusion of the distal external iliac vein, thrombus continuing into the common femoral vein  Ultrasound: Acute, nearly occlusive thrombus extending from the left popliteal vein through the common femoral vein. Extent of thrombus central to the inguinal ligament is unable to be determined on this study. Poor visualization of the calf veins  ASSESSMENT/PLAN: [name] [name] is a 46 y.o. female presenting with symptomatic left iliofemoral DVT.  [name] [name]'s been diligent with her anticoagulation since discharge, but continues to have pain in the left thigh.   I had a long discussion with [name] [name] regarding the extent of her left lower extremity deep venous thrombosis.  I am surprised that she is continuing to have as much pain as she is, especially as the DVT ends in the external iliac artery.  During her visit we discussed the attract trial, and how individuals treated medically versus those treated with surgery had similar outcomes at the 2-year mark.    I discussed her options-being continued medical management with compression versus endovascular mechanical thrombectomy, and we discussed the risk and benefits of both.  [name] [name] is aware that even with mechanical thrombectomy, the thrombotic burden that can be  removed a small.  She is also aware that the procedure may not alleviate all of her pain.  [name] [name] elected to pursue mechanical thrombectomy to alleviate the obstruction.  She is aware, that she will be laid prone on the operating table and accessed through her popliteal vein.  I discussed the narrowing that is present in the external iliac vein, and that this is likely due from external compression.  I have no plan to stent this area, and will send her to obstetrics and gynecology for consideration of a hysterectomy should the stenosis be significant.   [name], MD [facility] [phone]

## 2021-02-02 ENCOUNTER — Telehealth: Payer: Self-pay | Admitting: Cardiology

## 2021-02-02 ENCOUNTER — Other Ambulatory Visit: Payer: Self-pay

## 2021-02-02 ENCOUNTER — Encounter: Payer: Self-pay | Admitting: Vascular Surgery

## 2021-02-02 ENCOUNTER — Ambulatory Visit (INDEPENDENT_AMBULATORY_CARE_PROVIDER_SITE_OTHER): Payer: Medicare HMO | Admitting: Vascular Surgery

## 2021-02-02 VITALS — BP 154/86 | HR 81 | Temp 98.2°F | Resp 16 | Ht 70.0 in | Wt 296.0 lb

## 2021-02-02 DIAGNOSIS — I82422 Acute embolism and thrombosis of left iliac vein: Secondary | ICD-10-CM | POA: Diagnosis not present

## 2021-02-02 MED ORDER — ENOXAPARIN SODIUM 100 MG/ML IJ SOSY: 100.0000 mg | PREFILLED_SYRINGE | Freq: Two times a day (BID) | INTRAMUSCULAR | 0 refills | Status: DC

## 2021-02-02 MED ORDER — ENOXAPARIN SODIUM 30 MG/0.3ML IJ SOSY: 30.0000 mg | PREFILLED_SYRINGE | Freq: Two times a day (BID) | INTRAMUSCULAR | 0 refills | Status: DC

## 2021-02-02 NOTE — Telephone Encounter (Signed)
° °  Pre-operative Risk Assessment    Patient Name: [name] [name]  DOB: Jan 01, 1976 MRN: [id number]      Request for Surgical Clearance    Procedure: L Leg Venogram, Mechanical Thrombectomy   Date of Surgery:  Clearance 02/07/21                                 Surgeon:  Dr. Amil Amen Surgeon's Group or Practice Name:  [facility] Phone number:  [phone]  Fax number:  [phone]   Type of Clearance Requested:   - Pharmacy:  [facility]   Type of Anesthesia:   {Not Indicated 6. Are there any other requests or questions from the surgeon?    :1}  Additional requests/questions:    Signed, Johnna Acosta   02/02/2021, 12:22 PM

## 2021-02-02 NOTE — Telephone Encounter (Signed)
Patient with diagnosis of PE/DVT on warfarin for anticoagulation.    Procedure: L leg venogram, mechanical thrombectomy Date of procedure: 02/07/21  CrCl 118 Platelet count 325  Per office protocol, patient can hold warfarin for 5 days prior to procedure.   Patient WILL need bridging with Lovenox (enoxaparin) around procedure.  Per surgical office note she is followed by [facility] Coumadin clinic.  Please reach out to them for lovenox bridge.
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
## 2021-02-02 NOTE — Telephone Encounter (Signed)
Clinical pharmacist to review coumadin 

## 2021-02-02 NOTE — Telephone Encounter (Signed)
Left a message for the patient to call back and speak to the on-call preop APP of the day 

## 2021-02-07 ENCOUNTER — Ambulatory Visit (HOSPITAL_COMMUNITY)
Admission: RE | Admit: 2021-02-07 | Discharge: 2021-02-07 | Disposition: A | Discharge status: Home / Self Care | Payer: Medicare HMO | Attending: Vascular Surgery | Admitting: Vascular Surgery

## 2021-02-07 ENCOUNTER — Ambulatory Visit (HOSPITAL_COMMUNITY)
Admission: RE | Disposition: A | Discharge status: Home / Self Care | Payer: Self-pay | Source: Home / Self Care | Attending: Vascular Surgery

## 2021-02-07 ENCOUNTER — Other Ambulatory Visit: Payer: Self-pay

## 2021-02-07 DIAGNOSIS — I82422 Acute embolism and thrombosis of left iliac vein: Secondary | ICD-10-CM | POA: Diagnosis not present

## 2021-02-07 DIAGNOSIS — I82421 Acute embolism and thrombosis of right iliac vein: Secondary | ICD-10-CM | POA: Diagnosis not present

## 2021-02-07 DIAGNOSIS — I10 Essential (primary) hypertension: Secondary | ICD-10-CM | POA: Insufficient documentation

## 2021-02-07 DIAGNOSIS — Z7901 Long term (current) use of anticoagulants: Secondary | ICD-10-CM | POA: Diagnosis not present

## 2021-02-07 HISTORY — PX: LOWER EXTREMITY VENOGRAPHY: CATH118253

## 2021-02-07 HISTORY — PX: PERIPHERAL VASCULAR THROMBECTOMY: CATH118306

## 2021-02-07 LAB — POCT I-STAT, CHEM 8
BUN: 17 mg/dL (ref 6–20)
Calcium, Ion: 1.2 mmol/L (ref 1.15–1.40)
Chloride: 101 mmol/L (ref 98–111)
Creatinine, Ser: 0.8 mg/dL (ref 0.44–1.00)
Glucose, Bld: 86 mg/dL (ref 70–99)
HCT: 31 % — ABNORMAL LOW (ref 36.0–46.0)
Hemoglobin: 10.5 g/dL — ABNORMAL LOW (ref 12.0–15.0)
Potassium: 4.2 mmol/L (ref 3.5–5.1)
Sodium: 137 mmol/L (ref 135–145)
TCO2: 30 mmol/L (ref 22–32)

## 2021-02-07 LAB — PROTIME-INR
INR: 1.1 (ref 0.8–1.2)
Prothrombin Time: 14.6 seconds (ref 11.4–15.2)

## 2021-02-07 LAB — PREGNANCY, URINE: Preg Test, Ur: NEGATIVE

## 2021-02-07 SURGERY — LOWER EXTREMITY VENOGRAPHY
Anesthesia: LOCAL

## 2021-02-07 MED ORDER — IODIXANOL 320 MG/ML IV SOLN
INTRAVENOUS | Status: DC | PRN
  Administered 2021-02-07: 60 mL via INTRAVENOUS

## 2021-02-07 MED ORDER — HEPARIN SODIUM (PORCINE) 1000 UNIT/ML IJ SOLN
INTRAMUSCULAR | Status: AC
  Filled 2021-02-07: qty 10

## 2021-02-07 MED ORDER — MIDAZOLAM HCL 2 MG/2ML IJ SOLN
INTRAMUSCULAR | Status: DC | PRN
  Administered 2021-02-07: 1 mg via INTRAVENOUS

## 2021-02-07 MED ORDER — FENTANYL CITRATE (PF) 100 MCG/2ML IJ SOLN
INTRAMUSCULAR | Status: DC | PRN
  Administered 2021-02-07 (×2): 50 ug via INTRAVENOUS

## 2021-02-07 MED ORDER — LIDOCAINE HCL (PF) 1 % IJ SOLN
INTRAMUSCULAR | Status: DC | PRN
  Administered 2021-02-07: 5 mL via INTRADERMAL

## 2021-02-07 MED ORDER — SODIUM CHLORIDE 0.9 % IV SOLN: INTRAVENOUS | Status: DC

## 2021-02-07 MED ORDER — HEPARIN (PORCINE) IN NACL 1000-0.9 UT/500ML-% IV SOLN
INTRAVENOUS | Status: AC
  Filled 2021-02-07: qty 1000

## 2021-02-07 MED ORDER — MIDAZOLAM HCL 2 MG/2ML IJ SOLN
INTRAMUSCULAR | Status: AC
  Filled 2021-02-07: qty 2

## 2021-02-07 MED ORDER — LIDOCAINE HCL (PF) 1 % IJ SOLN
INTRAMUSCULAR | Status: AC
  Filled 2021-02-07: qty 30

## 2021-02-07 MED ORDER — FENTANYL CITRATE (PF) 100 MCG/2ML IJ SOLN
INTRAMUSCULAR | Status: AC
  Filled 2021-02-07: qty 2

## 2021-02-07 MED ORDER — HEPARIN (PORCINE) IN NACL 1000-0.9 UT/500ML-% IV SOLN
INTRAVENOUS | Status: DC | PRN
  Administered 2021-02-07: 500 mL

## 2021-02-07 MED ORDER — HEPARIN SODIUM (PORCINE) 1000 UNIT/ML IJ SOLN
INTRAMUSCULAR | Status: DC | PRN
  Administered 2021-02-07: 10000 [IU] via INTRAVENOUS

## 2021-02-07 SURGICAL SUPPLY — 16 items
BAG SNAP BAND KOVER 36X36 (MISCELLANEOUS) ×1 IMPLANT
CANISTER PENUMBRA ENGINE (MISCELLANEOUS) ×1 IMPLANT
CATH LITNG FLASH HTORQ 100BERN (CATHETERS) ×1 IMPLANT
COVER DOME SNAP 22 D (MISCELLANEOUS) ×1 IMPLANT
GLIDEWIRE ADV .035X260CM (WIRE) ×1 IMPLANT
GLIDEWIRE NITREX 0.018X80X5 (WIRE) ×2
GUIDEWIRE NITREX 0.018X80X5 (WIRE) IMPLANT
KIT MICROPUNCTURE NIT STIFF (SHEATH) ×1 IMPLANT
PROTECTION STATION PRESSURIZED (MISCELLANEOUS) ×2
SHEATH PERFORMER 16FR 30 (SHEATH) ×1 IMPLANT
SHEATH PINNACLE 5F 10CM (SHEATH) ×1 IMPLANT
SHEATH PINNACLE ST 7F 65CM (SHEATH) ×1 IMPLANT
SHEATH PROBE COVER 6X72 (BAG) ×1 IMPLANT
STATION PROTECTION PRESSURIZED (MISCELLANEOUS) IMPLANT
TRAY PV CATH (CUSTOM PROCEDURE TRAY) ×2 IMPLANT
WIRE BENTSON .035X145CM (WIRE) ×1 IMPLANT

## 2021-02-07 NOTE — H&P (Addendum)
Office Note   Patient seen and examined in preop holding.  No complaints. No changes to medication history or physical exam since last seen in clinic. After discussing the risks and benefits of left leg venogram, [name] [name] elected to proceed.   [name] John MD   CC: Left lower extremity DVT Requesting Provider:  No ref. provider found  HPI: [name] [name] is a 46 y.o. (1975-01-29) female presenting in follow-up after recent emergency department evaluation for left lower extremity swelling.  Imaging on 01/30/2021 demonstrated acute DVT extending from the distal left external iliac through the popliteal vein.  Prior to her presentation, [name] [name] said she had missed 2 doses of warfarin.  INR on 01/28/2021 was subtherapeutic.  She currently follows with the [facility] Coumadin clinic. Thrombophilia workup negative, with only antithrombin levels not sent.  [name] [name] has a history of unprovoked bilateral DVT resulting in bilateral PE, and currently has an IVC filter.  On exam today, [name] [name] continues to complain of left leg pain.  This is appreciated when she is walking and at rest.  Pain is mainly in her thigh.  As her discharge she has been diligent in taking her warfarin, as well as pain medication in an effort to sleep. She denies numbness and tingling at the toes, symptoms of claudication, ischemic rest pain, tissue loss.  The pt is not on a statin for cholesterol management.  The pt is not on a daily aspirin.   Other AC:  warfarin The pt is  on medication for hypertension.   The pt is not diabetic.  Tobacco hx:  -  Past Medical History:  Diagnosis Date   Abdominal cramps 08/30/2015   Anemia    Blood transfusion without reported diagnosis    Closed left ankle fracture    August 30 2012   Clotting disorder [facility])    DVT (deep venous thrombosis) (HCC)    L leg   Fibroid tumor    Hidradenitis    High blood pressure    Hypertension    Obesity    OSA on CPAP    Oxygen deficiency     Pregnancy    09/20/14 17 weeks   Pulmonary embolism ([place]) 04/2013   Sleep apnea    Tobacco use disorder     Past Surgical History:  Procedure Laterality Date   ABSCESS DRAINAGE Bilateral 01/10/15   COLONOSCOPY WITH PROPOFOL N/A 06/08/2018   Procedure: COLONOSCOPY WITH PROPOFOL;  Surgeon: Daneil Dolin, MD;  Location: [facility]O SUITE;  Service: Endoscopy;  Laterality: N/A;  2:30pm   CYST REMOVAL TRUNK     IVC FILTER PLACEMENT ([place] HX)      Social History   Socioeconomic History   Marital status: Widowed    Spouse name: Not on file   Number of children: Not on file   Years of education: Not on file   Highest education level: Not on file  Occupational History   Not on file  Tobacco Use   Smoking status: Some Days    Packs/day: 0.25    Years: 18.00    Pack years: 4.50    Types: Cigarettes   Smokeless tobacco: Never   Tobacco comments:    smokes 3 cig when she smokes  Vaping Use   Vaping Use: Never used  Substance and Sexual Activity   Alcohol use: Yes    Alcohol/week: 0.0 standard drinks    Comment: twice a month   Drug use: No   Sexual  activity: Not Currently    Birth control/protection: Pill  Other Topics Concern   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    Has a 45 year old [place].   Live with parents.   Was working 5 days a weeks.   Not working right now.    Attends church.    Social Determinants of Health   Financial Resource Strain: Not on file  Food Insecurity: Not on file  Transportation Needs: Not on file  Physical Activity: Not on file  Stress: Not on file  Social Connections: Not on file  Intimate Partner Violence: Not on file    Family History  Problem Relation Age of Onset   Hypertension Mother    Diabetes Paternal Aunt    Diabetes Paternal Uncle    Other Father        blood clots   Cancer Maternal Grandmother    Heart attack Paternal Grandmother    Colon cancer Neg Hx      Current Facility-Administered Medications  Medication Dose Route Frequency Provider Last Rate Last Admin   0.9 %  sodium chloride infusion   Intravenous Continuous [name] John, MD 250 mL/hr at 02/07/21 1242 Rate Change at 02/07/21 1242   fentaNYL (SUBLIMAZE) injection    PRN [name] John, MD   50 mcg at 02/07/21 1217   Heparin (Porcine) in NaCl 1000-0.9 UT/500ML-% SOLN    PRN [name] John, MD   500 mL at 02/07/21 1148   heparin sodium (porcine) injection    PRN [name] John, MD   10,000 Units at 02/07/21 1206   iodixanol (VISIPAQUE) 320 MG/ML injection    PRN [name] John, MD   60 mL at 02/07/21 1242   lidocaine (PF) (XYLOCAINE) 1 % injection    PRN [name] John, MD   5 mL at 02/07/21 1149   midazolam (VERSED) injection    PRN [name] John, MD   1 mg at 02/07/21 1142   Facility-Administered Medications Ordered in Other Encounters  Medication Dose Route Frequency Provider Last Rate Last Admin   0.9 %  sodium chloride infusion   Intravenous Continuous Kefalas, Thomas S, PA-C 20 mL/hr at 12/22/15 1430 New Bag at 12/22/15 1430    Allergies  Allergen Reactions   Sulfa Antibiotics Itching, Swelling and Rash    Lips swelling     REVIEW OF SYSTEMS:   [X]  denotes positive finding, [ ]  denotes negative finding Cardiac  Comments:  Chest pain or chest pressure:    Shortness of breath upon exertion:    Short of breath when lying flat:    Irregular heart rhythm:        Vascular    Pain in calf, thigh, or hip brought on by ambulation:    Pain in feet at night that wakes you up from your sleep:     Blood clot in your veins:    Leg swelling:         Pulmonary    Oxygen at home:    Productive cough:     Wheezing:         Neurologic    Sudden weakness in arms or legs:     Sudden numbness in arms or legs:     Sudden onset of difficulty speaking or slurred speech:    Temporary loss of vision in one eye:     Problems with dizziness:  Gastrointestinal    Blood in stool:     Vomited blood:         Genitourinary    Burning when urinating:     Blood in urine:        Psychiatric    Major depression:         Hematologic    Bleeding problems:    Problems with blood clotting too easily:        Skin    Rashes or ulcers:        Constitutional    Fever or chills:      PHYSICAL EXAMINATION:  Vitals:   02/07/21 1230 02/07/21 1235 02/07/21 1240 02/07/21 1245  BP: (!) 141/85 (!) 144/89 (!) 145/90   Pulse: 85 87 (!) 142 (!) 0  Resp: 10 18 12  (!) 0  Temp:      TempSrc:      SpO2: 100% 100% 100% (!) 0%  Weight:      Height:        General:  WDWN in NAD; vital signs documented above Gait: Not observed HENT: WNL, normocephalic Pulmonary: normal non-labored breathing , without wheezing Cardiac: regular HR, Abdomen: soft, NT, no masses Skin: without rashes Vascular Exam/Pulses:  Right Left                  DP 2+ (normal) 2+ (normal)  PT trace trace   Extremities: without ischemic changes, without Gangrene , without cellulitis; without open wounds; obese Musculoskeletal: no muscle wasting or atrophy  Neurologic: A&O X 3;  No focal weakness or paresthesias are detected Psychiatric:  The pt has Normal affect.   Non-Invasive Vascular Imaging:   Invasive vascular imaging was independently reviewed demonstrating compression of the external iliac vein, likely from uterine fibroid, thrombotic occlusion of the distal external iliac vein, thrombus continuing into the common femoral vein  Ultrasound: Acute, nearly occlusive thrombus extending from the left popliteal vein through the common femoral vein. Extent of thrombus central to the inguinal ligament is unable to be determined on this study. Poor visualization of the calf veins  ASSESSMENT/PLAN: TIKITA MABEE is a 46 y.o. female presenting with symptomatic left iliofemoral DVT.  Rhiley's been diligent with her anticoagulation since discharge, but  continues to have pain in the left thigh.   I had a long discussion with Adilen regarding the extent of her left lower extremity deep venous thrombosis.  I am surprised that she is continuing to have as much pain as she is, especially as the DVT ends in the external iliac artery.  During her visit we discussed the attract trial, and how individuals treated medically versus those treated with surgery had similar outcomes at the 2-year mark.    I discussed her options-being continued medical management with compression versus endovascular mechanical thrombectomy, and we discussed the risk and benefits of both.  Loretta is aware that even with mechanical thrombectomy, the thrombotic burden that can be removed a small.  She is also aware that the procedure may not alleviate all of her pain.  Shanin elected to pursue mechanical thrombectomy to alleviate the obstruction.  She is aware, that she will be laid prone on the operating table and accessed through her popliteal vein.  I discussed the narrowing that is present in the external iliac vein, and that this is likely due from external compression.  I have no plan to stent this area, and will send her to obstetrics and gynecology for consideration of a hysterectomy should  the stenosis be significant.   [name] John, MD [facility] [phone]

## 2021-02-07 NOTE — Op Note (Signed)
Patient name: [name] [name] MRN: [id number] DOB: 1975-05-18 Sex: female  02/07/2021 Pre-operative Diagnosis: Left iliofemoral deep venous thrombosis Post-operative diagnosis:  Same Surgeon:  [name], MD Procedure Performed: 1.  Ultrasound-guided micropuncture access of the left popliteal vein 2.  Left leg venogram  3.  Percutaneous mechanical suction thrombectomy -Penumbra 16 French lightening flash 4.  Popliteal access managed with 4-0 Monocryl suture  Indications:   [name] [name] is a 46 y.o. (February 10, 1975) female presenting in follow-up after recent emergency department evaluation for left lower extremity swelling.  Imaging on 01/30/2021 demonstrated acute DVT extending from the distal left external iliac through the popliteal vein.  Prior to her presentation, [name] [name] said she had missed 2 doses of warfarin.  INR on 01/28/2021 was subtherapeutic.  She currently follows with the [facility] Coumadin clinic. Thrombophilia workup negative, with only antithrombin levels not sent.  [name] [name]'s been diligent with her anticoagulation since discharge, but continues to have pain in the left thigh.    I had a long discussion with [name] [name] regarding the extent of her left lower extremity deep venous thrombosis.  I am surprised that she is continuing to have as much pain as she is, especially as the DVT ends in the external iliac artery.  During her visit we discussed the attract trial, and how individuals treated medically versus those treated with surgery had similar outcomes at the 2-year mark.     I discussed her options-being continued medical management with compression versus endovascular mechanical thrombectomy, and we discussed the risk and benefits of both.  [name] [name] is aware that even with mechanical thrombectomy, the thrombotic burden that can be removed a small.  She is also aware that the procedure may not alleviate all of her pain.  [name] [name] elected to pursue mechanical thrombectomy to alleviate  the obstruction.  She is aware, that she will be laid prone on the operating table and accessed through her popliteal vein.  I discussed the narrowing that is present in the external iliac vein, and that this is likely due from external compression/ previous DVT.  I have no plan to stent this area, and will send her to obstetrics and gynecology for consideration of a hysterectomy should the stenosis be significant.  Findings:  Small left-sided femoral vein, likely from fibrosis from previous DVT. Thrombus appreciated in the femoral vein, common femoral vein, distal external iliac artery.   Procedure:   Patient was brought to cardiac cath and laid in the prone position.  She was prepped and draped in standard fashion a timeout was performed.  The patient was given moderate sedation.  The case began with ultrasound-guided micropuncture access of the left popliteal vein. From this access, a 5 French sheath was used for follow-up venogram.  Findings are above.  I made the decision to intervene on the iliofemoral deep venous thrombosis.  A 16 French [place] sheath was brought onto the field and guided into the left femoral vein.  This proved difficult due to the stenosis of the distal femoral vein from previous DVT, however I was able to track the sheath.  Next, the 81 [place] penumbra/catheter was brought onto the field.  Similar to the sheath, I had difficulty in traversing the left femoral vein.  A 7 French by 45 cm sheath was used to dilate the tract, which allowed for the thrombectomy catheter to move freely.  The device was turned on, and run from the distal femoral vein through the external iliac vein.  There  were multiple passes, which yielded significant acute on chronic thrombus.  Completion venogram of the left leg demonstrated resolution of the thrombotic burden with no flow-limiting stenosis appreciated through the common femoral vein and femoral vein.  There was narrowing appreciated in the external  iliac vein, however as noted on previous CT, this appears to be due to to fibroids.  I elected not to stent the patient, and will have her see obstetrics and gynecology.  I do not think this was the nidus for her thrombus, but rather having a subtherapeutic INR.  [name] [name] tolerated the case well.  The percutaneous access site was closed with 4-0 Monocryl suture.  Custom compression stockings have been ordered, and the leg was wrapped postoperatively.   [name] Santee, MD [facility] [place] Office: [phone]

## 2021-02-08 ENCOUNTER — Encounter (HOSPITAL_COMMUNITY): Payer: Self-pay | Admitting: Vascular Surgery

## 2021-02-08 MED FILL — Heparin Sod (Porcine)-NaCl IV Soln 1000 Unit/500ML-0.9%: INTRAVENOUS | Qty: 500 | Status: AC

## 2021-02-09 ENCOUNTER — Telehealth: Payer: Self-pay

## 2021-02-09 NOTE — Telephone Encounter (Signed)
Pt called with c/o back pain that she had been experiencing since before her procedure. She is going to call her PCP and has also been advised to change positions and get up and walk more frequently, as she has been resting more since her procedure the other day. Pt has no other questions/concerns at this time.

## 2021-02-13 ENCOUNTER — Other Ambulatory Visit: Payer: Self-pay | Admitting: Cardiology

## 2021-02-13 DIAGNOSIS — I2602 Saddle embolus of pulmonary artery with acute cor pulmonale: Secondary | ICD-10-CM

## 2021-02-15 ENCOUNTER — Other Ambulatory Visit: Payer: Self-pay

## 2021-02-15 ENCOUNTER — Encounter: Payer: Self-pay | Admitting: Emergency Medicine

## 2021-02-15 ENCOUNTER — Telehealth: Payer: Self-pay | Admitting: Cardiology

## 2021-02-15 ENCOUNTER — Encounter (HOSPITAL_COMMUNITY): Payer: Self-pay | Admitting: Hematology

## 2021-02-15 ENCOUNTER — Ambulatory Visit (INDEPENDENT_AMBULATORY_CARE_PROVIDER_SITE_OTHER): Payer: Medicare HMO | Admitting: Emergency Medicine

## 2021-02-15 ENCOUNTER — Other Ambulatory Visit: Payer: Self-pay | Admitting: Cardiology

## 2021-02-15 DIAGNOSIS — R69 Illness, unspecified: Secondary | ICD-10-CM | POA: Diagnosis not present

## 2021-02-15 DIAGNOSIS — G4733 Obstructive sleep apnea (adult) (pediatric): Secondary | ICD-10-CM

## 2021-02-15 DIAGNOSIS — I2782 Chronic pulmonary embolism: Secondary | ICD-10-CM

## 2021-02-15 DIAGNOSIS — F17200 Nicotine dependence, unspecified, uncomplicated: Secondary | ICD-10-CM | POA: Diagnosis not present

## 2021-02-15 DIAGNOSIS — I2602 Saddle embolus of pulmonary artery with acute cor pulmonale: Secondary | ICD-10-CM

## 2021-02-15 DIAGNOSIS — Z9989 Dependence on other enabling machines and devices: Secondary | ICD-10-CM

## 2021-02-15 MED ORDER — WARFARIN SODIUM 10 MG PO TABS: ORAL_TABLET | ORAL | 0 refills | Status: DC

## 2021-02-15 NOTE — Assessment & Plan Note (Signed)
Discussed cessation with her today.  She is cut down to 3 cigarettes daily and is motivated to continue to decrease.  We can talk about trying to set a quit date when we follow-up in 6 months.

## 2021-02-15 NOTE — Assessment & Plan Note (Signed)
With subsequent DVTs.  Plan is for her to stay on anticoagulation lifelong as long as she is able to tolerate.  She is on warfarin which is being dose adjusted at cardiology in [place].

## 2021-02-15 NOTE — Progress Notes (Signed)
° °  Subjective:    Patient ID: [name] [name], female    DOB: [birth year], 46 y.o.   MRN: [id number]  HPI 46 yo smoker, HTN, obesity, admission for PE with associated PAH and RV dilation.  Diagnosed with severe obstructive sleep apnea and started on CPAP therapy 11 cm water.  She had a clot removed surgically by vascular from L LE recently. She remains on warfarin - had run out for 2 days only.  She is wearing CPAP most nights, does get a clinical benefit. Same settings as original orders.  She has lost wt since last time, almost 100lbs!!    Review of Systems  Constitutional:  Negative for fever and unexpected weight change.  HENT:  Negative for congestion, dental problem, ear pain, nosebleeds, postnasal drip, rhinorrhea, sinus pressure, sneezing, sore throat and trouble swallowing.   Eyes:  Negative for redness and itching.  Respiratory:  Negative for cough, chest tightness, shortness of breath and wheezing.   Cardiovascular:  Negative for chest pain, palpitations and leg swelling.  Gastrointestinal:  Negative for abdominal distention, nausea and vomiting.  Genitourinary:  Negative for dysuria.  Musculoskeletal:  Negative for joint swelling.  Skin:  Negative for rash.  Hematological:  Does not bruise/bleed easily.  Psychiatric/Behavioral:  Negative for dysphoric mood. The patient is not nervous/anxious.      Objective:   Physical Exam Vitals:   02/15/21 1604  BP: 134/80  Pulse: 98  Temp: 98.3 F (36.8 C)  TempSrc: Oral  SpO2: 99%  Weight: 290 lb 12.8 oz (131.9 kg)   Gen: Pleasant, obese woman, in no distress,  normal affect  ENT: No lesions,  mouth clear,  oropharynx clear, no postnasal drip  Neck: No JVD, no stridor  Lungs: No use of accessory muscles, clear without rales or rhonchi  Cardiovascular: RRR, heart sounds normal, no murmur or gallops, no peripheral edema  Musculoskeletal: No deformities, no cyanosis or clubbing, no calf tenderness  Neuro: alert, non  focal  Skin: Warm, no lesions or rash     Assessment & Plan:  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  Plan is for her to stay on anticoagulation lifelong as long as she is able to tolerate.  She is on warfarin which is being dose adjusted at cardiology in [place].  OSA on CPAP Fairly good compliance with her CPAP.  She does skip some nights.  I encouraged her to try to use it every night.  Set on 11 cmH2O.  She does not have oxygen bled in.  Should be able to discontinue her oxygen if she does not desaturate on ambulation today.  Suspect she will need new equipment soon as her order is originally from 2017.  If so we will work on getting a home sleep study.  Tobacco use disorder Discussed cessation with her today.  She is cut down to 3 cigarettes daily and is motivated to continue to decrease.  We can talk about trying to set a quit date when we follow-up in 6 months.  Baltazar Apo, MD, PhD 02/15/2021, 4:52 PM [facility] [phone] or if no answer [phone]

## 2021-02-15 NOTE — Patient Instructions (Addendum)
Please continue CPAP every night as you have been using it.  It will probably be time for [place] to work on getting you new equipment at some point soon.  In order to do so we will likely need to repeat a home sleep test.  We can talk about this next time. Congratulations on decreasing your cigarettes!  We will talk about possibly setting a quit date when we follow-up. Continue your warfarin as you have been using it. We will perform a walking oximetry today.  If your oxygen levels stay at goal then we will send an order to discontinue your home oxygen set up Follow with Dr Lamonte Sakai in 6 months or sooner if you have any problems

## 2021-02-15 NOTE — Assessment & Plan Note (Signed)
Fairly good compliance with her CPAP.  She does skip some nights.  I encouraged her to try to use it every night.  Set on 11 cmH2O.  She does not have oxygen bled in.  Should be able to discontinue her oxygen if she does not desaturate on ambulation today.  Suspect she will need new equipment soon as her order is originally from 2017.  If so we will work on getting a home sleep study.

## 2021-02-15 NOTE — Telephone Encounter (Signed)
*  STAT* If patient is at the pharmacy, call can be transferred to refill team.   1. Which medications need to be refilled? (please list name of each medication and dose if known)  warfarin (COUMADIN) 10 MG tablet  2. Which pharmacy/location (including street and city if local pharmacy) is medication to be sent to? [place], [address]  3. Do they need a 30 day or 90 day supply?   90 day supply  Patient is completely out of medication.

## 2021-02-15 NOTE — Addendum Note (Signed)
Addended by: Gavin Potters R on: 02/15/2021 04:58 PM   Modules accepted: Orders

## 2021-02-15 NOTE — Telephone Encounter (Signed)
Spoke with pt.  States she has not been getting INR's checked at [facility] coumadin clinic as stated in D/C Summary.  Has not had INR checked here since 09/2020.  INR appt made for Monday 02/19/21.  Enough warfarin sent in till pt comes for appt.

## 2021-02-20 ENCOUNTER — Ambulatory Visit (INDEPENDENT_AMBULATORY_CARE_PROVIDER_SITE_OTHER): Payer: Medicare HMO | Admitting: *Deleted

## 2021-02-20 DIAGNOSIS — I2602 Saddle embolus of pulmonary artery with acute cor pulmonale: Secondary | ICD-10-CM | POA: Diagnosis not present

## 2021-02-20 DIAGNOSIS — Z86711 Personal history of pulmonary embolism: Secondary | ICD-10-CM | POA: Diagnosis not present

## 2021-02-20 DIAGNOSIS — Z5181 Encounter for therapeutic drug level monitoring: Secondary | ICD-10-CM

## 2021-02-20 DIAGNOSIS — I2782 Chronic pulmonary embolism: Secondary | ICD-10-CM

## 2021-02-20 LAB — POCT INR: INR: 1.6 — AB (ref 2.0–3.0)

## 2021-02-20 MED ORDER — WARFARIN SODIUM 10 MG PO TABS: ORAL_TABLET | ORAL | 1 refills | Status: DC

## 2021-02-20 NOTE — Patient Instructions (Signed)
Take warfarin 2 tablets tonight and tomorrow night then resume 1 tablet daily except 1 1/2 tablets on Tuesdays and Saturdays Recheck in 2 wks

## 2021-03-08 ENCOUNTER — Ambulatory Visit (INDEPENDENT_AMBULATORY_CARE_PROVIDER_SITE_OTHER): Payer: Medicare HMO | Admitting: *Deleted

## 2021-03-08 ENCOUNTER — Other Ambulatory Visit (HOSPITAL_COMMUNITY)
Admission: RE | Admit: 2021-03-08 | Discharge: 2021-03-08 | Disposition: A | Discharge status: Home / Self Care | Payer: Medicare HMO | Source: Ambulatory Visit | Attending: Cardiology | Admitting: Cardiology

## 2021-03-08 DIAGNOSIS — Z5181 Encounter for therapeutic drug level monitoring: Secondary | ICD-10-CM | POA: Diagnosis not present

## 2021-03-08 DIAGNOSIS — I2782 Chronic pulmonary embolism: Secondary | ICD-10-CM | POA: Diagnosis not present

## 2021-03-08 DIAGNOSIS — Z86711 Personal history of pulmonary embolism: Secondary | ICD-10-CM | POA: Diagnosis not present

## 2021-03-08 DIAGNOSIS — I2602 Saddle embolus of pulmonary artery with acute cor pulmonale: Secondary | ICD-10-CM

## 2021-03-08 LAB — PROTIME-INR
INR: 5.6 (ref 0.8–1.2)
Prothrombin Time: 50.6 seconds — ABNORMAL HIGH (ref 11.4–15.2)

## 2021-03-08 LAB — POCT INR: INR: 7.6 — AB (ref 2.0–3.0)

## 2021-03-08 NOTE — Patient Instructions (Addendum)
POC INR 7.6  Sent to [facility] for STAT PT/INR  INR was 5.6 Hold warfarin tonight and Friday night, take 1 tablet Saturday night then resume 1 tablet daily except 1 1/2 tablets on Tuesdays and Saturdays Pt denies S/S of bleeding.  Bleeding and fall precautions discussed with pt and she verbalized understanding. Recheck in 1 week

## 2021-03-14 ENCOUNTER — Other Ambulatory Visit: Payer: Self-pay

## 2021-03-14 DIAGNOSIS — I82422 Acute embolism and thrombosis of left iliac vein: Secondary | ICD-10-CM

## 2021-03-15 NOTE — Progress Notes (Unsigned)
Office Note    HPI: [name] [name] is a 46 y.o. (Dec 24, 1975) female presenting in follow up s/ 02/08/20 Percutaneous mechanical suction thrombectomy - Penumbra 16 French lightening flash.    The pt is *** on a statin for cholesterol management.  The pt is *** on a daily aspirin.   Other AC:  *** The pt is *** on medication for hypertension.   The pt is *** diabetic.  Tobacco hx:  ***  Past Medical History:  Diagnosis Date   Abdominal cramps 08/30/2015   Anemia    Blood transfusion without reported diagnosis    Closed left ankle fracture    August 30 2012   Clotting disorder [facility])    DVT (deep venous thrombosis) (HCC)    L leg   Fibroid tumor    Hidradenitis    High blood pressure    Hypertension    Obesity    OSA on CPAP    Oxygen deficiency    Pregnancy    09/20/14 17 weeks   Pulmonary embolism ([place]) 04/2013   Sleep apnea    Tobacco use disorder     Past Surgical History:  Procedure Laterality Date   ABSCESS DRAINAGE Bilateral 01/10/15   COLONOSCOPY WITH PROPOFOL N/A 06/08/2018   Procedure: COLONOSCOPY WITH PROPOFOL;  Surgeon: Daneil Dolin, MD;  [facility];  Service: Endoscopy;  Laterality: N/A;  2:30pm   CYST REMOVAL TRUNK     IVC FILTER PLACEMENT ([place] HX)     LOWER EXTREMITY VENOGRAPHY N/A 02/07/2021   Procedure: LOWER EXTREMITY VENOGRAPHY;  Surgeon: [name], MD;  Location: [facility] LAB;  Service: Cardiovascular;  Laterality: N/A;   PERIPHERAL VASCULAR THROMBECTOMY N/A 02/07/2021   Procedure: PERIPHERAL VASCULAR THROMBECTOMY;  Surgeon: [name], MD;  Location: [facility];  Service: Cardiovascular;  Laterality: N/A;    Social History   Socioeconomic History   Marital status: Widowed    Spouse name: Not on file   Number of children: Not on file   Years of education: Not on file   Highest education level: Not on file  Occupational History   Not on file  Tobacco Use   Smoking status: Some Days    Packs/day: 0.25     Years: 18.00    Pack years: 4.50    Types: Cigarettes   Smokeless tobacco: Never   Tobacco comments:    smokes 3 cig a day ARJ 02/15/21  Vaping Use   Vaping Use: Never used  Substance and Sexual Activity   Alcohol use: Yes    Alcohol/week: 0.0 standard drinks    Comment: twice a month   Drug use: No   Sexual activity: Not Currently    Birth control/protection: Pill  Other Topics Concern   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    Has a 45 year old [place].   Live with parents.   Was working 5 days a weeks.   Not working right now.    Attends church.    Social Determinants of Health   Financial Resource Strain: Not on file  Food Insecurity: Not on file  Transportation Needs: Not on file  Physical Activity: Not on file  Stress: Not on file  Social Connections: Not on file  Intimate Partner Violence: Not on file   *** Family History  Problem Relation Age of Onset   Hypertension Mother  Diabetes Paternal Aunt    Diabetes Paternal Uncle    Other Father        blood clots   Cancer Maternal Grandmother    Heart attack Paternal Grandmother    Colon cancer Neg Hx     Current Outpatient Medications  Medication Sig Dispense Refill   amLODipine (NORVASC) 10 MG tablet Take 1 tablet (10 mg total) by mouth daily. 90 tablet 3   cyclobenzaprine (FLEXERIL) 10 MG tablet Take 1 tablet (10 mg total) by mouth 2 (two) times daily as needed for muscle spasms. 20 tablet 0   enoxaparin (LOVENOX) 100 MG/ML injection Inject 1 mL (100 mg total) into the skin every 12 (twelve) hours for 2 days. 4 mL 0   enoxaparin (LOVENOX) 30 MG/0.3ML injection Inject 0.3 mLs (30 mg total) into the skin every 12 (twelve) hours for 2 days. 1.2 mL 0   triamterene-hydrochlorothiazide (MAXZIDE-25) 37.5-25 MG tablet TAKE 1 TABLET BY MOUTH ONCE A DAY. 30 tablet 3   warfarin (COUMADIN) 10 MG tablet TAKE (1) OR (1) 1/2 TABLET BY MOUTH DAILY AS DIRECTED.  45 tablet 1   No current facility-administered medications for this visit.   Facility-Administered Medications Ordered in Other Visits  Medication Dose Route Frequency Provider Last Rate Last Admin   0.9 %  sodium chloride infusion   Intravenous Continuous Baird Cancer, PA-C 20 mL/hr at 12/22/15 1430 New Bag at 12/22/15 1430    Allergies  Allergen Reactions   Sulfa Antibiotics Itching, Swelling and Rash    Lips swelling     REVIEW OF SYSTEMS:  *** [X]  denotes positive finding, [ ]  denotes negative finding Cardiac  Comments:  Chest pain or chest pressure:    Shortness of breath upon exertion:    Short of breath when lying flat:    Irregular heart rhythm:        Vascular    Pain in calf, thigh, or hip brought on by ambulation:    Pain in feet at night that wakes you up from your sleep:     Blood clot in your veins:    Leg swelling:         Pulmonary    Oxygen at home:    Productive cough:     Wheezing:         Neurologic    Sudden weakness in arms or legs:     Sudden numbness in arms or legs:     Sudden onset of difficulty speaking or slurred speech:    Temporary loss of vision in one eye:     Problems with dizziness:         Gastrointestinal    Blood in stool:     Vomited blood:         Genitourinary    Burning when urinating:     Blood in urine:        Psychiatric    Major depression:         Hematologic    Bleeding problems:    Problems with blood clotting too easily:        Skin    Rashes or ulcers:        Constitutional    Fever or chills:      PHYSICAL EXAMINATION:  There were no vitals filed for this visit.  General:  WDWN in NAD; vital signs documented above Gait: Not observed HENT: WNL, normocephalic Pulmonary: normal non-labored breathing , without wheezing Cardiac: {Desc; regular/irreg:14544} HR, bruit*** Abdomen: soft,  NT, no masses Skin: {With/Without:20273} rashes Vascular Exam/Pulses:  Right Left  Radial {Exam; arterial  pulse strength 0-4:30167} {Exam; arterial pulse strength 0-4:30167}  Ulnar {Exam; arterial pulse strength 0-4:30167} {Exam; arterial pulse strength 0-4:30167}  Femoral {Exam; arterial pulse strength 0-4:30167} {Exam; arterial pulse strength 0-4:30167}  Popliteal {Exam; arterial pulse strength 0-4:30167} {Exam; arterial pulse strength 0-4:30167}  DP {Exam; arterial pulse strength 0-4:30167} {Exam; arterial pulse strength 0-4:30167}  PT {Exam; arterial pulse strength 0-4:30167} {Exam; arterial pulse strength 0-4:30167}   Extremities: {With/Without:20273} ischemic changes, {With/Without:20273} Gangrene , {With/Without:20273} cellulitis; {With/Without:20273} open wounds;  Musculoskeletal: no muscle wasting or atrophy  Neurologic: A&O X 3;  No focal weakness or paresthesias are detected Psychiatric:  The pt has {Desc; normal/abnormal:11317::"Normal"} affect.   Non-Invasive Vascular Imaging:   ***    ASSESSMENT/PLAN: [name] [name] is a 46 y.o. female presenting with ***   ***   [name], MD [facility] [phone]

## 2021-03-16 ENCOUNTER — Encounter: Payer: Medicare HMO | Admitting: Vascular Surgery

## 2021-03-16 ENCOUNTER — Inpatient Hospital Stay (HOSPITAL_COMMUNITY): Admission: RE | Admit: 2021-03-16 | Payer: Medicare HMO | Source: Ambulatory Visit

## 2021-04-13 ENCOUNTER — Ambulatory Visit: Payer: Medicare HMO | Admitting: Cardiology

## 2021-04-23 ENCOUNTER — Ambulatory Visit (INDEPENDENT_AMBULATORY_CARE_PROVIDER_SITE_OTHER): Payer: Medicare HMO | Admitting: *Deleted

## 2021-04-23 DIAGNOSIS — I2782 Chronic pulmonary embolism: Secondary | ICD-10-CM

## 2021-04-23 DIAGNOSIS — I2602 Saddle embolus of pulmonary artery with acute cor pulmonale: Secondary | ICD-10-CM | POA: Diagnosis not present

## 2021-04-23 DIAGNOSIS — Z86711 Personal history of pulmonary embolism: Secondary | ICD-10-CM

## 2021-04-23 DIAGNOSIS — Z5181 Encounter for therapeutic drug level monitoring: Secondary | ICD-10-CM | POA: Diagnosis not present

## 2021-04-23 LAB — POCT INR: INR: 1.2 — AB (ref 2.0–3.0)

## 2021-04-23 MED ORDER — WARFARIN SODIUM 10 MG PO TABS: ORAL_TABLET | ORAL | 1 refills | Status: DC

## 2021-04-23 NOTE — Patient Instructions (Signed)
Take warfarin 2 tablets tonight, 1 1/2 tablets Tuesday and Wednesday then resume 1 tablet daily except 1 1/2 tablets on Tuesdays and Saturdays ?Recheck in 2 wks ?

## 2021-04-26 DIAGNOSIS — Z0100 Encounter for examination of eyes and vision without abnormal findings: Secondary | ICD-10-CM | POA: Diagnosis not present

## 2021-04-26 DIAGNOSIS — H5210 Myopia, unspecified eye: Secondary | ICD-10-CM | POA: Diagnosis not present

## 2021-05-01 ENCOUNTER — Ambulatory Visit: Payer: Medicare HMO | Admitting: Cardiology

## 2021-05-01 NOTE — Progress Notes (Deleted)
? ? ? ?Clinical Summary ?[name] [name] is a 46 y.o.female seen today for follow up of the following medical problems.  ?  ?1. History of pulmonary embolism/Pulmonary HTN ?- history of unprovoked saddle PE in 04/2013. Followed by hematology, plans for lifelong anticoagulation. Has been on coumadin. From [facility] hematology notes did not recommend a DOAC, stating have not been well studied in patients over 250 lbs.  ?- 04/2013 LVEF 58-52%, grade I diastolic dysfunction. Flattening of interventricular septum with mild RV dysfunction, PASP 53.  ?-repeat CT did show near complete lysis of prior extensive PE.  ?  ?- 08/2017 echo LVEF 60-65%, no WMAs, normal RV ?- no recent bleeding on coumadin ?  ?Jan 2023 ER visit leg pain, extensive distal left exertenal iliac through popliteal vein DVT. Had subtherepaeutic INR at the time.  ?- s/p mechanical thrombectomy ?  ?  ?2. HTN ?- she is compliant with meds ?  ?3. Anemia ?- followed by hematology ?- on iron infusions.  ? ?4. OSA on cpap ?Past Medical History:  ?Diagnosis Date  ? Abdominal cramps 08/30/2015  ? Anemia   ? Blood transfusion without reported diagnosis   ? Closed left ankle fracture   ? August 30 2012  ? Clotting disorder ([place])   ? DVT (deep venous thrombosis) ([place])   ? L leg  ? Fibroid tumor   ? Hidradenitis   ? High blood pressure   ? Hypertension   ? Obesity   ? OSA on CPAP   ? Oxygen deficiency   ? Pregnancy   ? 09/20/14 17 weeks  ? Pulmonary embolism ([place]) 04/2013  ? Sleep apnea   ? Tobacco use disorder   ? ? ? ?Allergies  ?Allergen Reactions  ? Sulfa Antibiotics Itching, Swelling and Rash  ?  Lips swelling  ? ? ? ?Current Outpatient Medications  ?Medication Sig Dispense Refill  ? amLODipine (NORVASC) 10 MG tablet Take 1 tablet (10 mg total) by mouth daily. 90 tablet 3  ? cyclobenzaprine (FLEXERIL) 10 MG tablet Take 1 tablet (10 mg total) by mouth 2 (two) times daily as needed for muscle spasms. 20 tablet 0  ? enoxaparin (LOVENOX) 100 MG/ML injection Inject 1 mL (100 mg total)  into the skin every 12 (twelve) hours for 2 days. 4 mL 0  ? enoxaparin (LOVENOX) 30 MG/0.3ML injection Inject 0.3 mLs (30 mg total) into the skin every 12 (twelve) hours for 2 days. 1.2 mL 0  ? triamterene-hydrochlorothiazide (MAXZIDE-25) 37.5-25 MG tablet TAKE 1 TABLET BY MOUTH ONCE A DAY. 30 tablet 3  ? warfarin (COUMADIN) 10 MG tablet TAKE (1) OR (1) 1/2 TABLET BY MOUTH DAILY AS DIRECTED. 45 tablet 1  ? ?No current facility-administered medications for this visit.  ? ?Facility-Administered Medications Ordered in Other Visits  ?Medication Dose Route Frequency Provider Last Rate Last Admin  ? 0.9 %  sodium chloride infusion   Intravenous Continuous Baird Cancer, PA-C 20 mL/hr at 12/22/15 1430 New Bag at 12/22/15 1430  ? ? ? ?Past Surgical History:  ?Procedure Laterality Date  ? ABSCESS DRAINAGE Bilateral 01/10/15  ? COLONOSCOPY WITH PROPOFOL N/A 06/08/2018  ? Procedure: COLONOSCOPY WITH PROPOFOL;  Surgeon: Daneil Dolin, MD;  [facility];  Service: Endoscopy;  Laterality: N/A;  2:30pm  ? CYST REMOVAL TRUNK    ? IVC FILTER PLACEMENT ([facility] HX)    ? LOWER EXTREMITY VENOGRAPHY N/A 02/07/2021  ? Procedure: LOWER EXTREMITY VENOGRAPHY;  Surgeon: [name], MD;  Location: [facility]  INVASIVE CV LAB;  Service: Cardiovascular;  Laterality: N/A;  ? PERIPHERAL VASCULAR THROMBECTOMY N/A 02/07/2021  ? Procedure: PERIPHERAL VASCULAR THROMBECTOMY;  Surgeon: [name], MD;  Location: [facility];  Service: Cardiovascular;  Laterality: N/A;  ? ? ? ?Allergies  ?Allergen Reactions  ? Sulfa Antibiotics Itching, Swelling and Rash  ?  Lips swelling  ? ? ? ? ?Family History  ?Problem Relation Age of Onset  ? Hypertension Mother   ? Diabetes Paternal Aunt   ? Diabetes Paternal Uncle   ? Other Father   ?     blood clots  ? Cancer Maternal Grandmother   ? Heart attack Paternal Grandmother   ? Colon cancer Neg Hx   ? ? ? ?Social History ?[name] [name] reports that she has been smoking cigarettes. She has a 4.50  pack-year smoking history. She has never used smokeless tobacco. ?[name] [name] reports current alcohol use. ? ? ?Review of Systems ?CONSTITUTIONAL: No weight loss, fever, chills, weakness or fatigue.  ?HEENT: Eyes: No visual loss, blurred vision, double vision or yellow sclerae.No hearing loss, sneezing, congestion, runny nose or sore throat.  ?SKIN: No rash or itching.  ?CARDIOVASCULAR:  ?RESPIRATORY: No shortness of breath, cough or sputum.  ?GASTROINTESTINAL: No anorexia, nausea, vomiting or diarrhea. No abdominal pain or blood.  ?GENITOURINARY: No burning on urination, no polyuria ?NEUROLOGICAL: No headache, dizziness, syncope, paralysis, ataxia, numbness or tingling in the extremities. No change in bowel or bladder control.  ?MUSCULOSKELETAL: No muscle, back pain, joint pain or stiffness.  ?LYMPHATICS: No enlarged nodes. No history of splenectomy.  ?PSYCHIATRIC: No history of depression or anxiety.  ?ENDOCRINOLOGIC: No reports of sweating, cold or heat intolerance. No polyuria or polydipsia.  ?. ? ? ?Physical Examination ?There were no vitals filed for this visit. ?There were no vitals filed for this visit. ? ?Gen: resting comfortably, no acute distress ?HEENT: no scleral icterus, pupils equal round and reactive, no palptable cervical adenopathy,  ?CV ?Resp: Clear to auscultation bilaterally ?GI: abdomen is soft, non-tender, non-distended, normal bowel sounds, no hepatosplenomegaly ?MSK: extremities are warm, no edema.  ?Skin: warm, no rash ?Neuro:  no focal deficits ?Psych: appropriate affect ? ? ?Diagnostic Studies ? ?04/2013 echo ?Study Conclusions ? ?- Study data: Technically difficult study. ?- Procedure narrative: Transthoracic echocardiography. Image ?  quality was adequate. The study was technically difficult, ?  as a result of body habitus. ?- Left ventricle: The cavity size was normal. Wall thickness ?  was increased in a pattern of moderate LVH. Systolic ?  function was vigorous. The estimated ejection  fraction was ?  in the range of 65% to 70%. Wall motion was normal; there ?  were no regional wall motion abnormalities. Doppler ?  parameters are consistent with abnormal left ventricular ?  relaxation (grade 1 diastolic dysfunction). ?- Aortic valve: Valve area: 2.01cm^2(VTI). Valve area: ?  2.11cm^2 (Vmax). ?- Right ventricle: There is systolic flattening of the ?  interventricular septum consistent with RV pressure ?  overload. The cavity size was severely dilated. Systolic ?  function was mildly reduced. ?- Pulmonary arteries: Systolic pressure was moderately ?  increased. PA peak pressure: 40m Hg (S). ?  ?  ?08/2017 echo ?Study Conclusions ?  ?- Left ventricle: The cavity size was normal. Wall thickness was ?  increased in a pattern of mild LVH. Systolic function was normal. ?  The estimated ejection fraction was in the range of 60% to 65%. ?  Wall motion was normal; there  were no regional wall motion ?  abnormalities. Left ventricular diastolic function parameters ?  were normal. ?  ? ? ?Assessment and Plan  ?1. History of pulmonary embolism/Pulmonary HTN ?- committed to lifelong anticoag, hematology has recommended coumadin over DOACs for her ?- needs f/u in coumadin clinic, we will arrange ?- continue current meds ?  ?  ?2. HTN ?- she is at goal, continue currnet meds ? ? ? ? ? ?Arnoldo Lenis, M.D., F.A.C.C. ?

## 2021-06-04 ENCOUNTER — Other Ambulatory Visit: Payer: Self-pay

## 2021-06-04 ENCOUNTER — Inpatient Hospital Stay (HOSPITAL_COMMUNITY)
Admission: EM | Admit: 2021-06-04 | Discharge: 2021-06-07 | DRG: 193 | Disposition: A | Discharge status: Home / Self Care | Payer: Medicare HMO | Attending: Family Medicine | Admitting: Family Medicine

## 2021-06-04 ENCOUNTER — Emergency Department (HOSPITAL_COMMUNITY): Payer: Medicare HMO

## 2021-06-04 ENCOUNTER — Encounter (HOSPITAL_COMMUNITY): Payer: Self-pay

## 2021-06-04 ENCOUNTER — Ambulatory Visit: Payer: Medicare HMO

## 2021-06-04 DIAGNOSIS — D509 Iron deficiency anemia, unspecified: Secondary | ICD-10-CM | POA: Diagnosis present

## 2021-06-04 DIAGNOSIS — Z86718 Personal history of other venous thrombosis and embolism: Secondary | ICD-10-CM

## 2021-06-04 DIAGNOSIS — J159 Unspecified bacterial pneumonia: Secondary | ICD-10-CM | POA: Diagnosis not present

## 2021-06-04 DIAGNOSIS — R9431 Abnormal electrocardiogram [ECG] [EKG]: Secondary | ICD-10-CM | POA: Diagnosis not present

## 2021-06-04 DIAGNOSIS — D649 Anemia, unspecified: Secondary | ICD-10-CM | POA: Diagnosis not present

## 2021-06-04 DIAGNOSIS — Z86711 Personal history of pulmonary embolism: Secondary | ICD-10-CM | POA: Diagnosis present

## 2021-06-04 DIAGNOSIS — N920 Excessive and frequent menstruation with regular cycle: Secondary | ICD-10-CM | POA: Diagnosis present

## 2021-06-04 DIAGNOSIS — F17200 Nicotine dependence, unspecified, uncomplicated: Secondary | ICD-10-CM | POA: Diagnosis present

## 2021-06-04 DIAGNOSIS — I110 Hypertensive heart disease with heart failure: Secondary | ICD-10-CM | POA: Diagnosis present

## 2021-06-04 DIAGNOSIS — I10 Essential (primary) hypertension: Secondary | ICD-10-CM | POA: Diagnosis present

## 2021-06-04 DIAGNOSIS — J189 Pneumonia, unspecified organism: Secondary | ICD-10-CM | POA: Diagnosis not present

## 2021-06-04 DIAGNOSIS — R059 Cough, unspecified: Secondary | ICD-10-CM | POA: Diagnosis not present

## 2021-06-04 DIAGNOSIS — D259 Leiomyoma of uterus, unspecified: Secondary | ICD-10-CM | POA: Diagnosis present

## 2021-06-04 DIAGNOSIS — G4733 Obstructive sleep apnea (adult) (pediatric): Secondary | ICD-10-CM | POA: Diagnosis present

## 2021-06-04 DIAGNOSIS — I5033 Acute on chronic diastolic (congestive) heart failure: Secondary | ICD-10-CM | POA: Diagnosis present

## 2021-06-04 DIAGNOSIS — Z20822 Contact with and (suspected) exposure to covid-19: Secondary | ICD-10-CM | POA: Diagnosis present

## 2021-06-04 DIAGNOSIS — F17210 Nicotine dependence, cigarettes, uncomplicated: Secondary | ICD-10-CM | POA: Diagnosis present

## 2021-06-04 DIAGNOSIS — E669 Obesity, unspecified: Secondary | ICD-10-CM | POA: Diagnosis present

## 2021-06-04 DIAGNOSIS — Z7901 Long term (current) use of anticoagulants: Secondary | ICD-10-CM

## 2021-06-04 DIAGNOSIS — I444 Left anterior fascicular block: Secondary | ICD-10-CM | POA: Diagnosis present

## 2021-06-04 DIAGNOSIS — R0602 Shortness of breath: Secondary | ICD-10-CM | POA: Diagnosis not present

## 2021-06-04 DIAGNOSIS — Z79899 Other long term (current) drug therapy: Secondary | ICD-10-CM

## 2021-06-04 DIAGNOSIS — Z8249 Family history of ischemic heart disease and other diseases of the circulatory system: Secondary | ICD-10-CM

## 2021-06-04 DIAGNOSIS — R079 Chest pain, unspecified: Secondary | ICD-10-CM | POA: Diagnosis not present

## 2021-06-04 DIAGNOSIS — Z882 Allergy status to sulfonamides status: Secondary | ICD-10-CM

## 2021-06-04 DIAGNOSIS — Z6841 Body Mass Index (BMI) 40.0 and over, adult: Secondary | ICD-10-CM

## 2021-06-04 DIAGNOSIS — D61818 Other pancytopenia: Secondary | ICD-10-CM | POA: Diagnosis present

## 2021-06-04 DIAGNOSIS — E876 Hypokalemia: Secondary | ICD-10-CM | POA: Diagnosis present

## 2021-06-04 DIAGNOSIS — D500 Iron deficiency anemia secondary to blood loss (chronic): Secondary | ICD-10-CM | POA: Diagnosis present

## 2021-06-04 LAB — PROTIME-INR
INR: 2 — ABNORMAL HIGH (ref 0.8–1.2)
Prothrombin Time: 22.7 seconds — ABNORMAL HIGH (ref 11.4–15.2)

## 2021-06-04 LAB — CBC
HCT: 25.8 % — ABNORMAL LOW (ref 36.0–46.0)
Hemoglobin: 6.9 g/dL — CL (ref 12.0–15.0)
MCH: 17.4 pg — ABNORMAL LOW (ref 26.0–34.0)
MCHC: 26.7 g/dL — ABNORMAL LOW (ref 30.0–36.0)
MCV: 65 fL — ABNORMAL LOW (ref 80.0–100.0)
Platelets: 281 10*3/uL (ref 150–400)
RBC: 3.97 MIL/uL (ref 3.87–5.11)
RDW: 22.6 % — ABNORMAL HIGH (ref 11.5–15.5)
WBC: 6.5 10*3/uL (ref 4.0–10.5)
nRBC: 0 % (ref 0.0–0.2)

## 2021-06-04 MED ORDER — ALBUTEROL SULFATE HFA 108 (90 BASE) MCG/ACT IN AERS
2.0000 | INHALATION_SPRAY | RESPIRATORY_TRACT | Status: DC | PRN
  Filled 2021-06-04: qty 6.7

## 2021-06-04 NOTE — ED Triage Notes (Signed)
Pt c/o SOB x 3 days with productive coughing and clear sputum. Chest pain off and on in different areas of her chest and lethargic.  ?

## 2021-06-05 ENCOUNTER — Encounter (HOSPITAL_COMMUNITY): Payer: Self-pay | Admitting: Family Medicine

## 2021-06-05 ENCOUNTER — Inpatient Hospital Stay (HOSPITAL_COMMUNITY): Payer: Medicare HMO

## 2021-06-05 ENCOUNTER — Emergency Department (HOSPITAL_COMMUNITY): Payer: Medicare HMO

## 2021-06-05 ENCOUNTER — Telehealth: Payer: Self-pay | Admitting: *Deleted

## 2021-06-05 DIAGNOSIS — Z6841 Body Mass Index (BMI) 40.0 and over, adult: Secondary | ICD-10-CM | POA: Diagnosis not present

## 2021-06-05 DIAGNOSIS — Z9989 Dependence on other enabling machines and devices: Secondary | ICD-10-CM

## 2021-06-05 DIAGNOSIS — I5033 Acute on chronic diastolic (congestive) heart failure: Secondary | ICD-10-CM

## 2021-06-05 DIAGNOSIS — N920 Excessive and frequent menstruation with regular cycle: Secondary | ICD-10-CM | POA: Diagnosis not present

## 2021-06-05 DIAGNOSIS — R059 Cough, unspecified: Secondary | ICD-10-CM | POA: Diagnosis not present

## 2021-06-05 DIAGNOSIS — E669 Obesity, unspecified: Secondary | ICD-10-CM | POA: Diagnosis not present

## 2021-06-05 DIAGNOSIS — D259 Leiomyoma of uterus, unspecified: Secondary | ICD-10-CM | POA: Diagnosis not present

## 2021-06-05 DIAGNOSIS — I444 Left anterior fascicular block: Secondary | ICD-10-CM | POA: Diagnosis not present

## 2021-06-05 DIAGNOSIS — Z79899 Other long term (current) drug therapy: Secondary | ICD-10-CM | POA: Diagnosis not present

## 2021-06-05 DIAGNOSIS — I10 Essential (primary) hypertension: Secondary | ICD-10-CM

## 2021-06-05 DIAGNOSIS — E876 Hypokalemia: Secondary | ICD-10-CM | POA: Diagnosis not present

## 2021-06-05 DIAGNOSIS — J159 Unspecified bacterial pneumonia: Secondary | ICD-10-CM | POA: Diagnosis not present

## 2021-06-05 DIAGNOSIS — D61818 Other pancytopenia: Secondary | ICD-10-CM | POA: Diagnosis present

## 2021-06-05 DIAGNOSIS — D649 Anemia, unspecified: Secondary | ICD-10-CM

## 2021-06-05 DIAGNOSIS — Z8249 Family history of ischemic heart disease and other diseases of the circulatory system: Secondary | ICD-10-CM | POA: Diagnosis not present

## 2021-06-05 DIAGNOSIS — D509 Iron deficiency anemia, unspecified: Secondary | ICD-10-CM

## 2021-06-05 DIAGNOSIS — J189 Pneumonia, unspecified organism: Secondary | ICD-10-CM

## 2021-06-05 DIAGNOSIS — R079 Chest pain, unspecified: Secondary | ICD-10-CM | POA: Diagnosis not present

## 2021-06-05 DIAGNOSIS — D500 Iron deficiency anemia secondary to blood loss (chronic): Secondary | ICD-10-CM | POA: Diagnosis not present

## 2021-06-05 DIAGNOSIS — Z882 Allergy status to sulfonamides status: Secondary | ICD-10-CM | POA: Diagnosis not present

## 2021-06-05 DIAGNOSIS — R69 Illness, unspecified: Secondary | ICD-10-CM | POA: Diagnosis not present

## 2021-06-05 DIAGNOSIS — F17210 Nicotine dependence, cigarettes, uncomplicated: Secondary | ICD-10-CM | POA: Diagnosis present

## 2021-06-05 DIAGNOSIS — R0602 Shortness of breath: Secondary | ICD-10-CM | POA: Diagnosis not present

## 2021-06-05 DIAGNOSIS — Z20822 Contact with and (suspected) exposure to covid-19: Secondary | ICD-10-CM | POA: Diagnosis not present

## 2021-06-05 DIAGNOSIS — G4733 Obstructive sleep apnea (adult) (pediatric): Secondary | ICD-10-CM | POA: Diagnosis not present

## 2021-06-05 DIAGNOSIS — R918 Other nonspecific abnormal finding of lung field: Secondary | ICD-10-CM | POA: Diagnosis not present

## 2021-06-05 DIAGNOSIS — Z7901 Long term (current) use of anticoagulants: Secondary | ICD-10-CM | POA: Diagnosis not present

## 2021-06-05 DIAGNOSIS — Z86711 Personal history of pulmonary embolism: Secondary | ICD-10-CM | POA: Diagnosis not present

## 2021-06-05 DIAGNOSIS — Z86718 Personal history of other venous thrombosis and embolism: Secondary | ICD-10-CM | POA: Diagnosis not present

## 2021-06-05 DIAGNOSIS — I110 Hypertensive heart disease with heart failure: Secondary | ICD-10-CM | POA: Diagnosis not present

## 2021-06-05 LAB — BASIC METABOLIC PANEL
Anion gap: 7 (ref 5–15)
BUN: 14 mg/dL (ref 6–20)
CO2: 25 mmol/L (ref 22–32)
Calcium: 8.2 mg/dL — ABNORMAL LOW (ref 8.9–10.3)
Chloride: 102 mmol/L (ref 98–111)
Creatinine, Ser: 0.83 mg/dL (ref 0.44–1.00)
GFR, Estimated: >60 mL/min (ref >60)
Glucose, Bld: 96 mg/dL (ref 70–99)
Potassium: 3.4 mmol/L — ABNORMAL LOW (ref 3.5–5.1)
Sodium: 134 mmol/L — ABNORMAL LOW (ref 135–145)

## 2021-06-05 LAB — ECHOCARDIOGRAM COMPLETE
AR max vel: 3.19 cm2
AV Area VTI: 3.22 cm2
AV Area mean vel: 3.11 cm2
AV Mean grad: 4 mmHg
AV Peak grad: 7.4 mmHg
Ao pk vel: 1.36 m/s
Area-P 1/2: 3.83 cm2
Height: 70 in
MV VTI: 2.47 cm2
S' Lateral: 5 cm
Single Plane A4C EF: 56.5 %
Weight: 4518.55 oz

## 2021-06-05 LAB — COMPREHENSIVE METABOLIC PANEL
ALT: 15 U/L (ref 0–44)
AST: 15 U/L (ref 15–41)
Albumin: 3.1 g/dL — ABNORMAL LOW (ref 3.5–5.0)
Alkaline Phosphatase: 43 U/L (ref 38–126)
Anion gap: 5 (ref 5–15)
BUN: 16 mg/dL (ref 6–20)
CO2: 25 mmol/L (ref 22–32)
Calcium: 8.4 mg/dL — ABNORMAL LOW (ref 8.9–10.3)
Chloride: 103 mmol/L (ref 98–111)
Creatinine, Ser: 0.73 mg/dL (ref 0.44–1.00)
GFR, Estimated: >60 mL/min (ref >60)
Glucose, Bld: 99 mg/dL (ref 70–99)
Potassium: 3.5 mmol/L (ref 3.5–5.1)
Sodium: 133 mmol/L — ABNORMAL LOW (ref 135–145)
Total Bilirubin: 0.5 mg/dL (ref 0.3–1.2)
Total Protein: 7.4 g/dL (ref 6.5–8.1)

## 2021-06-05 LAB — RETICULOCYTES
Immature Retic Fract: 32.9 % — ABNORMAL HIGH (ref 2.3–15.9)
RBC.: 3.93 MIL/uL (ref 3.87–5.11)
Retic Count, Absolute: 46.4 10*3/uL (ref 19.0–186.0)
Retic Ct Pct: 1.2 % (ref 0.4–3.1)

## 2021-06-05 LAB — CBC
HCT: 26.3 % — ABNORMAL LOW (ref 36.0–46.0)
Hemoglobin: 7.2 g/dL — ABNORMAL LOW (ref 12.0–15.0)
MCH: 18.2 pg — ABNORMAL LOW (ref 26.0–34.0)
MCHC: 27.4 g/dL — ABNORMAL LOW (ref 30.0–36.0)
MCV: 66.6 fL — ABNORMAL LOW (ref 80.0–100.0)
Platelets: 258 10*3/uL (ref 150–400)
RBC: 3.95 MIL/uL (ref 3.87–5.11)
RDW: 24.1 % — ABNORMAL HIGH (ref 11.5–15.5)
WBC: 6.9 10*3/uL (ref 4.0–10.5)
nRBC: 0 % (ref 0.0–0.2)

## 2021-06-05 LAB — HEMOGLOBIN AND HEMATOCRIT, BLOOD
HCT: 26.3 % — ABNORMAL LOW (ref 36.0–46.0)
HCT: 31.6 % — ABNORMAL LOW (ref 36.0–46.0)
Hemoglobin: 7.2 g/dL — ABNORMAL LOW (ref 12.0–15.0)
Hemoglobin: 8.8 g/dL — ABNORMAL LOW (ref 12.0–15.0)

## 2021-06-05 LAB — PREPARE RBC (CROSSMATCH)

## 2021-06-05 LAB — RESP PANEL BY RT-PCR (FLU A&B, COVID) ARPGX2
Influenza A by PCR: NEGATIVE
Influenza B by PCR: NEGATIVE
SARS Coronavirus 2 by RT PCR: NEGATIVE

## 2021-06-05 LAB — VITAMIN D 25 HYDROXY (VIT D DEFICIENCY, FRACTURES): Vit D, 25-Hydroxy: 21.18 ng/mL — ABNORMAL LOW (ref 30–100)

## 2021-06-05 LAB — VITAMIN B12: Vitamin B-12: 224 pg/mL (ref 180–914)

## 2021-06-05 LAB — IRON AND TIBC
Iron: 23 ug/dL — ABNORMAL LOW (ref 28–170)
Saturation Ratios: 6 % — ABNORMAL LOW (ref 10.4–31.8)
TIBC: 417 ug/dL (ref 250–450)
UIBC: 394 ug/dL

## 2021-06-05 LAB — FERRITIN: Ferritin: 8 ng/mL — ABNORMAL LOW (ref 11–307)

## 2021-06-05 LAB — BRAIN NATRIURETIC PEPTIDE: B Natriuretic Peptide: 537 pg/mL — ABNORMAL HIGH (ref 0.0–100.0)

## 2021-06-05 LAB — MAGNESIUM: Magnesium: 2.1 mg/dL (ref 1.7–2.4)

## 2021-06-05 LAB — HIV ANTIBODY (ROUTINE TESTING W REFLEX): HIV Screen 4th Generation wRfx: NONREACTIVE

## 2021-06-05 LAB — MRSA NEXT GEN BY PCR, NASAL: MRSA by PCR Next Gen: NOT DETECTED

## 2021-06-05 LAB — TROPONIN I (HIGH SENSITIVITY): Troponin I (High Sensitivity): 13 ng/L (ref <18)

## 2021-06-05 LAB — LACTIC ACID, PLASMA: Lactic Acid, Venous: 1.5 mmol/L (ref 0.5–1.9)

## 2021-06-05 LAB — FOLATE: Folate: 6.4 ng/mL (ref >5.9)

## 2021-06-05 MED ORDER — GUAIFENESIN ER 600 MG PO TB12
600.0000 mg | ORAL_TABLET | Freq: Two times a day (BID) | ORAL | Status: DC
  Administered 2021-06-05 – 2021-06-07 (×5): 600 mg via ORAL
  Filled 2021-06-05 (×6): qty 1

## 2021-06-05 MED ORDER — WARFARIN SODIUM 5 MG PO TABS: 10.0000 mg | ORAL_TABLET | ORAL | Status: DC

## 2021-06-05 MED ORDER — FUROSEMIDE 10 MG/ML IJ SOLN: 40.0000 mg | Freq: Every day | INTRAMUSCULAR | Status: DC

## 2021-06-05 MED ORDER — SODIUM CHLORIDE 0.9 % IV SOLN
1.0000 g | Freq: Once | INTRAVENOUS | Status: AC
  Administered 2021-06-05: 1 g via INTRAVENOUS
  Filled 2021-06-05: qty 10

## 2021-06-05 MED ORDER — SODIUM CHLORIDE 0.9 % IV SOLN
500.0000 mg | INTRAVENOUS | Status: DC
  Administered 2021-06-05 – 2021-06-07 (×3): 500 mg via INTRAVENOUS
  Filled 2021-06-05 (×2): qty 5

## 2021-06-05 MED ORDER — AMLODIPINE BESYLATE 5 MG PO TABS
10.0000 mg | ORAL_TABLET | Freq: Every day | ORAL | Status: DC
  Administered 2021-06-05 – 2021-06-07 (×3): 10 mg via ORAL
  Filled 2021-06-05 (×4): qty 2

## 2021-06-05 MED ORDER — MAGNESIUM HYDROXIDE 400 MG/5ML PO SUSP: 30.0000 mL | Freq: Every day | ORAL | Status: DC | PRN

## 2021-06-05 MED ORDER — PERFLUTREN LIPID MICROSPHERE
1.0000 mL | INTRAVENOUS | Status: AC | PRN
  Administered 2021-06-05: 6 mL via INTRAVENOUS

## 2021-06-05 MED ORDER — SODIUM CHLORIDE 0.9 % IV SOLN
10.0000 mL/h | Freq: Once | INTRAVENOUS | Status: AC
  Administered 2021-06-05: 10 mL/h via INTRAVENOUS

## 2021-06-05 MED ORDER — POTASSIUM CHLORIDE CRYS ER 20 MEQ PO TBCR
40.0000 meq | EXTENDED_RELEASE_TABLET | Freq: Two times a day (BID) | ORAL | Status: AC
  Administered 2021-06-05 (×2): 40 meq via ORAL
  Filled 2021-06-05 (×2): qty 2

## 2021-06-05 MED ORDER — ONDANSETRON HCL 4 MG PO TABS
4.0000 mg | ORAL_TABLET | Freq: Four times a day (QID) | ORAL | Status: DC | PRN
Start: 2021-06-05 — End: 2021-06-07

## 2021-06-05 MED ORDER — WARFARIN - PHARMACIST DOSING INPATIENT
Freq: Every day | Status: DC
Start: 2021-06-05 — End: 2021-06-07

## 2021-06-05 MED ORDER — IOHEXOL 350 MG/ML SOLN
100.0000 mL | Freq: Once | INTRAVENOUS | Status: AC | PRN
  Administered 2021-06-05: 100 mL via INTRAVENOUS

## 2021-06-05 MED ORDER — VITAMIN D 25 MCG (1000 UNIT) PO TABS
1000.0000 [IU] | ORAL_TABLET | Freq: Every day | ORAL | Status: DC
  Administered 2021-06-06 – 2021-06-07 (×2): 1000 [IU] via ORAL
  Filled 2021-06-05 (×3): qty 1

## 2021-06-05 MED ORDER — FUROSEMIDE 10 MG/ML IJ SOLN
40.0000 mg | Freq: Two times a day (BID) | INTRAMUSCULAR | Status: DC
  Administered 2021-06-05: 40 mg via INTRAVENOUS
  Filled 2021-06-05: qty 4

## 2021-06-05 MED ORDER — SODIUM CHLORIDE 0.9% IV SOLUTION: Freq: Once | INTRAVENOUS | Status: AC

## 2021-06-05 MED ORDER — ONDANSETRON HCL 4 MG/2ML IJ SOLN: 4.0000 mg | Freq: Four times a day (QID) | INTRAMUSCULAR | Status: DC | PRN

## 2021-06-05 MED ORDER — CYCLOBENZAPRINE HCL 10 MG PO TABS
10.0000 mg | ORAL_TABLET | Freq: Two times a day (BID) | ORAL | Status: DC | PRN
  Filled 2021-06-05: qty 1

## 2021-06-05 MED ORDER — TRAZODONE HCL 50 MG PO TABS: 25.0000 mg | ORAL_TABLET | Freq: Every evening | ORAL | Status: DC | PRN

## 2021-06-05 MED ORDER — CHLORHEXIDINE GLUCONATE CLOTH 2 % EX PADS
6.0000 | MEDICATED_PAD | Freq: Every day | CUTANEOUS | Status: DC
  Administered 2021-06-05 – 2021-06-06 (×2): 6 via TOPICAL

## 2021-06-05 MED ORDER — SODIUM CHLORIDE 0.9 % IV SOLN
2.0000 g | INTRAVENOUS | Status: DC
  Administered 2021-06-06 – 2021-06-07 (×2): 2 g via INTRAVENOUS
  Filled 2021-06-05 (×2): qty 20

## 2021-06-05 MED ORDER — ACETAMINOPHEN 650 MG RE SUPP: 650.0000 mg | Freq: Four times a day (QID) | RECTAL | Status: DC | PRN

## 2021-06-05 MED ORDER — NICOTINE 14 MG/24HR TD PT24: 14.0000 mg | MEDICATED_PATCH | Freq: Every day | TRANSDERMAL | Status: DC | PRN

## 2021-06-05 MED ORDER — TRIAMTERENE-HCTZ 37.5-25 MG PO TABS
1.0000 | ORAL_TABLET | Freq: Every day | ORAL | Status: DC
  Filled 2021-06-05 (×2): qty 1

## 2021-06-05 MED ORDER — FUROSEMIDE 10 MG/ML IJ SOLN
40.0000 mg | Freq: Once | INTRAMUSCULAR | Status: AC
  Administered 2021-06-05: 40 mg via INTRAVENOUS
  Filled 2021-06-05: qty 4

## 2021-06-05 MED ORDER — FUROSEMIDE 10 MG/ML IJ SOLN
40.0000 mg | Freq: Two times a day (BID) | INTRAMUSCULAR | Status: DC
  Administered 2021-06-05 – 2021-06-07 (×4): 40 mg via INTRAVENOUS
  Filled 2021-06-05 (×4): qty 4

## 2021-06-05 MED ORDER — SODIUM CHLORIDE 0.9 % IV SOLN
500.0000 mg | Freq: Once | INTRAVENOUS | Status: DC
  Filled 2021-06-05: qty 5

## 2021-06-05 MED ORDER — POTASSIUM CHLORIDE CRYS ER 20 MEQ PO TBCR: 40.0000 meq | EXTENDED_RELEASE_TABLET | Freq: Two times a day (BID) | ORAL | Status: DC

## 2021-06-05 MED ORDER — WARFARIN SODIUM 7.5 MG PO TABS
15.0000 mg | ORAL_TABLET | Freq: Once | ORAL | Status: AC
  Administered 2021-06-05: 15 mg via ORAL
  Filled 2021-06-05: qty 2

## 2021-06-05 MED ORDER — ACETAMINOPHEN 325 MG PO TABS: 650.0000 mg | ORAL_TABLET | Freq: Four times a day (QID) | ORAL | Status: DC | PRN

## 2021-06-05 NOTE — Progress Notes (Signed)
?PROGRESS NOTE ? ? ?[name] [name]  MBW:[id number] DOB: 03-22-1975 DOA: 06/04/2021 ?PCP: Carrolyn Meiers, MD  ? ?Chief Complaint  ?Patient presents with  ? Shortness of Breath  ? ?Level of care: Stepdown ? ?Brief Admission History:  ?46 y.o. African-American female with medical history significant for hypertension, DVT and PE, on anticoagulation with Coumadin, OSA on CPAP and tobacco abuse, who presented to the ER with acute onset of worsening dyspnea with associated cough productive of thick white sputum since Thursday as well as occasional chest pain with cough.  She admits to orthopnea and paroxysmal nocturnal dyspnea as well as dyspnea on exertion.  No worsening lower extremity edema.  She has been having cold chills but did not check her temperature and does not believe she had fever.  No nausea or vomiting or abdominal pain.  No dysuria, acute diarrhea or hematuria or flank pain. ?  ?ED Course: She came to the ER, respiratory rate was 21 with otherwise normal vital signs.  Labs revealed a BNP of 537 and high-sensitivity troponin of 13.  CMP was remarkable for borderline potassium of 3.5 and sodium 133 with albumin of 3.1.  CBC showed hemoglobin of 6.9 hematocrit 25.8 with low RBC indices .H&H were 9.2 and 34.7 on 01/30/2021. ?EKG as reviewed by me : EKG showed sinus rhythm with rate of 93 with PACs, left anterior fascicular block and low voltage QRS. ?Imaging: Two-view chest x-ray showed vascular congestion with diffuse interstitial opacities, patchy left lower lobe airspace disease and diffuse interstitial thickening that may be secondary to pulmonary edema or atypical infection.  Chest CTA revealed no evidence for PE.  It showed groundglass opacities in the mid and lower lungs left greater than right that could reflect edema or pneumonia/pneumonitis. ? ?The patient was given IV Rocephin and Zithromax.  She was typed and crossmatch and started on transfusion with 1 unit of packed red blood cells.  She  will be admitted to a stepdown unit bed for further evaluation and management. ? ?06/05/2021: Hg only up to 7.2.  Transfuse an additional 2 units of PRBC today.  Continue IV diuresis with lasix.   ?  ?Assessment and Plan: ?* CAP (community acquired pneumonia) ?- The patient will be admitted to a stepdown unit bed. ?- Continue antibiotic therapy with IV Rocephin and Zithromax. ?- Mucolytic therapy and bronchodilator therapy will be provided as needed. ?- We will follow blood cultures. ?- We will follow O2 protocol. ? ?Hypokalemia ?- check magnesium and replace as needed ?- add oral potassium supplement while on lasix  ?- follow basic metabolic panel  ? ?Essential hypertension ?- due to soft BPs will temporarily hold her home antihypertensives ? ?Symptomatic anemia ?- This is likely contributing to her acute heart failure. ?- transfusing total of 3 units PRBC, follow Hg closely  ?- she needs gynecology follow up to address menorrhagia ?- follow up anemia panel  ? ?Acute on chronic diastolic CHF (congestive heart failure) ([place]) ?- Continue IV Lasix ?- This could be related to high-output failure though due to symptomatic anemia and possibly due to cor pulmonale. ?- 2D echo be obtained. ?- treating severe anemia with PRBC transfusion ?- monitoring weights, I/Os, electrolytes ? ?Menorrhagia ?- causing severe iron deficiency and anemia ?- pt counseled she needs to see a gynecologist as soon as able  ?- she has documented history of uterine leiomyoma that may need more aggressive treatments at this point.  ? ?History of pulmonary embolism ?- We will continue warfarin  per pharm D and follow INR.  ?- she reports she has been on warfarin since 2015 and plan was for lifetime anticoagulation ? ?Severe Iron deficiency anemia ?- transfusing 3 units PRBC total ?- start daily iron/vitamin C supplementation  ?- address menorrhagia with gynecology follow up  ? ?OSA on CPAP ?- We will continue her CPAP nightly. ? ?Tobacco use  disorder ?- will offer nicotine patch and counseling ? ? ?DVT prophylaxis: therapeutic warfarin  ?Code Status: full  ?Family Communication: discussed plan with patient who verbalized understanding ?Disposition: Status is: Inpatient ?Remains inpatient appropriate because: requires IV lasix for diuresis  ?  ?Consultants:  ? ?Procedures:  ?PRBC transfusion 06/05/21 ?Antimicrobials:  ?Azithromycin 5/16>> ?Ceftriaxone 5/16>>  ?Subjective: ?Pt reports that SOB starting to improve.  No Chest Pain symptoms.  ?Objective: ?Vitals:  ? 06/05/21 0800 06/05/21 0900 06/05/21 1000 06/05/21 1109  ?BP: (!) 116/92 (!) 113/47 115/65   ?Pulse: 94 94 83   ?Resp: (!) 25 (!) 34 (!) 25   ?Temp:    98.2 ?F (36.8 ?C)  ?TempSrc:    Oral  ?SpO2: 100% 100% 97%   ?Weight:      ?Height:      ? ? ?Intake/Output Summary (Last 24 hours) at 06/05/2021 1112 ?Last data filed at 06/05/2021 3810 ?Gross per 24 hour  ?Intake 292 ml  ?Output 3200 ml  ?Net -2908 ml  ? ?Filed Weights  ? 06/04/21 1335 06/05/21 0238  ?Weight: 128.5 kg 128.1 kg  ? ?Examination: ? ?General exam: Appears calm and comfortable  ?Respiratory system: crackles heard on left anterior chest wall. Respiratory effort normal. ?Cardiovascular system: normal S1 & S2 heard. No JVD, murmurs, rubs, gallops or clicks. No pedal edema. ?Gastrointestinal system: Abdomen is nondistended, soft and nontender. No organomegaly or masses felt. Normal bowel sounds heard. ?Central nervous system: Alert and oriented. No focal neurological deficits. ?Extremities: Symmetric 5 x 5 power. ?Skin: 1+ pretibial edema BLEs.  No rashes, lesions or ulcers. ?Psychiatry: Judgement and insight appear normal. Mood & affect appropriate.  ? ?Data Reviewed: I have personally reviewed following labs and imaging studies ? ?CBC: ?Recent Labs  ?Lab 06/04/21 ?2321 06/05/21 ?0750  ?WBC 6.5 6.9  ?HGB 6.9* 7.2*  7.2*  ?HCT 25.8* 26.3*  26.3*  ?MCV 65.0* 66.6*  ?PLT 281 258  ? ? ?Basic Metabolic Panel: ?Recent Labs  ?Lab 06/04/21 ?2321  06/05/21 ?0750  ?NA 133* 134*  ?K 3.5 3.4*  ?CL 103 102  ?CO2 25 25  ?GLUCOSE 99 96  ?BUN 16 14  ?CREATININE 0.73 0.83  ?CALCIUM 8.4* 8.2*  ? ? ?CBG: ?No results for input(s): GLUCAP in the last 168 hours. ? ?Recent Results (from the past 240 hour(s))  ?Resp Panel by RT-PCR (Flu A&B, Covid) Nasopharyngeal Swab     Status: None  ? Collection Time: 06/05/21  1:25 AM  ? Specimen: Nasopharyngeal Swab; Nasopharyngeal(NP) swabs in vial transport medium  ?Result Value Ref Range Status  ? SARS Coronavirus 2 by RT PCR NEGATIVE NEGATIVE Final  ?  Comment: (NOTE) ?SARS-CoV-2 target nucleic acids are NOT DETECTED. ? ?The SARS-CoV-2 RNA is generally detectable in upper respiratory ?specimens during the acute phase of infection. The lowest ?concentration of SARS-CoV-2 viral copies this assay can detect is ?138 copies/mL. A negative result does not preclude SARS-Cov-2 ?infection and should not be used as the sole basis for treatment or ?other patient management decisions. A negative result may occur with  ?improper specimen collection/handling, submission of specimen other ?than nasopharyngeal swab,  presence of viral mutation(s) within the ?areas targeted by this assay, and inadequate number of viral ?copies(<138 copies/mL). A negative result must be combined with ?clinical observations, patient history, and epidemiological ?information. The expected result is Negative. ? ?Fact Sheet for Patients:  ?EntrepreneurPulse.com.au ? ?Fact Sheet for Healthcare Providers:  ?IncredibleEmployment.be ? ?This test is no t yet approved or cleared by the [place] FDA and  ?has been authorized for detection and/or diagnosis of SARS-CoV-2 by ?FDA under an Emergency Use Authorization (EUA). This EUA will remain  ?in effect (meaning this test can be used) for the duration of the ?COVID-19 declaration under Section 564(b)(1) of the Act, 21 ?U.S.C.section 360bbb-3(b)(1), unless the authorization is terminated   ?or revoked sooner.  ? ? ?  ? Influenza A by PCR NEGATIVE NEGATIVE Final  ? Influenza B by PCR NEGATIVE NEGATIVE Final  ?  Comment: (NOTE) ?The Xpert Xpress SARS-CoV-2/FLU/RSV plus assay is intended as an aid ?in

## 2021-06-05 NOTE — Progress Notes (Signed)
Pt setup on CPAP 10 H2O with 2lpm bleed in with medium mask  machine pulled in red outlet. Pt tolerating well RT will continue to monitor ?

## 2021-06-05 NOTE — Assessment & Plan Note (Addendum)
-   This is likely contributing to her acute heart failure. ?- transfusing total of 3 units PRBC, follow Hg closely  ?- she needs gynecology follow up to address menorrhagia ?- follow up anemia panel  ?

## 2021-06-05 NOTE — Assessment & Plan Note (Addendum)
-   We will continue warfarin per pharm D and follow INR.  ?- she reports she has been on warfarin since 2015 and plan was for lifetime anticoagulation ?

## 2021-06-05 NOTE — Progress Notes (Signed)
*  PRELIMINARY RESULTS* ?Echocardiogram ?2D Echocardiogram has been performed. ? ?[name] [name] ?06/05/2021, 12:03 PM ?

## 2021-06-05 NOTE — TOC Initial Note (Signed)
Transition of Care (TOC) - Initial/Assessment Note  ? ? ?Patient Details  ?Name: KYLAH MARESH ?MRN: [id number] ?Date of Birth: [birth year] ? ?Transition of Care (TOC) CM/SW Contact:    ?Albany Winslow D, LCSW ?Phone Number: ?06/05/2021, 3:36 PM ? ?Clinical Narrative:                 ?Patient from home, admitted with CAP. TOC consult for HF home screen. Patient is independent at baseline, drives. No DME in currently. Has not been on oxygen in 3-4 years, was initially on oxygen wheh she had a blood clot in 2015. Does not follow a HH diet at home. Uses salt. Does not take daily weights. Tries to maintain medical appointments. Missed cards appointment yesterday because she was in ED.  ? ?Expected Discharge Plan: Home/Self Care ?Barriers to Discharge: Continued Medical Work up ? ? ?Patient Goals and CMS Choice ?Patient states their goals for this hospitalization and ongoing recovery are:: return home ?  ?  ? ?Expected Discharge Plan and Services ?Expected Discharge Plan: Home/Self Care ?  ?  ?  ?Living arrangements for the past 2 months: [place] ?                ?  ?  ?  ?  ?  ?  ?  ?  ?  ?  ? ?Prior Living Arrangements/Services ?Living arrangements for the past 2 months: [place] ?  ?  ?       ?  ?  ?  ?  ? ?Activities of Daily Living ?Home Assistive Devices/Equipment: None ?ADL Screening (condition at time of admission) ?Patient's cognitive ability adequate to safely complete daily activities?: Yes ?Is the patient deaf or have difficulty hearing?: No ?Does the patient have difficulty seeing, even when wearing glasses/contacts?: No ?Does the patient have difficulty concentrating, remembering, or making decisions?: No ?Patient able to express need for assistance with ADLs?: Yes ?Does the patient have difficulty dressing or bathing?: No ?Independently performs ADLs?: Yes (appropriate for developmental age) ?Communication: Independent ?Dressing (OT): Independent ?Grooming: Independent ?Feeding:  Independent ?Bathing: Independent ?Toileting: Independent ?In/Out Bed: Independent ?Walks in Home: Independent ?Does the patient have difficulty walking or climbing stairs?: Yes ?Weakness of Legs: Both ?Weakness of Arms/Hands: None ? ?Permission Sought/Granted ?  ?  ?   ?   ?   ?   ? ?Emotional Assessment ?  ?  ?  ?  ?  ?  ? ?Admission diagnosis:  CAP (community acquired pneumonia) [J18.9] ?Community acquired pneumonia, unspecified laterality [J18.9] ?Acute anemia [D64.9] ?Patient Active Problem List  ? Diagnosis Date Noted  ? CAP (community acquired pneumonia) 06/05/2021  ? Acute on chronic diastolic CHF (congestive heart failure) ([place]) 06/05/2021  ? Symptomatic anemia 06/05/2021  ? Essential hypertension 06/05/2021  ? Hypokalemia 06/05/2021  ? Acute anemia   ? Lumbar spondylosis 12/14/2018  ? Lumbar foraminal stenosis 12/14/2018  ? Lumbar degenerative disc disease 12/14/2018  ? Chronic anticoagulation 03/24/2018  ? STD exposure 03/11/2018  ? History of miscarriage 12/01/2017  ? HTN (hypertension) 12/01/2017  ? Left leg DVT ([place]) 12/01/2017  ? Menorrhagia 12/01/2017  ? S/P IVC filter 12/01/2017  ? Chronic bilateral low back pain with bilateral sciatica 12/01/2017  ? Chronic pain of left ankle 12/01/2017  ? Chronic pain syndrome 12/01/2017  ? Long term current use of opiate analgesic 12/01/2017  ? Encounter for therapeutic drug monitoring 09/03/2017  ? History of pulmonary embolism 08/27/2017  ? Abdominal cramps 08/30/2015  ?  Morbid obesity ([place]) 08/18/2014  ? Uterine leiomyoma 08/18/2014  ? Benign essential hypertension 07/21/2014  ? Severe Iron deficiency anemia 02/05/2014  ? Back pain with left-sided radiculopathy 10/31/2013  ? Secondary pulmonary hypertension 07/06/2013  ? OSA on CPAP 07/06/2013  ? Pleuritic chest pain 04/27/2013  ? SOB (shortness of breath) 04/27/2013  ? Anemia 04/27/2013  ? Tobacco use disorder 04/27/2013  ? Saddle embolus of pulmonary artery with acute cor pulmonale ([place]) 04/27/2013  ?  Bilateral pulmonary embolism ([place]) 04/27/2013  ? ?PCP:  Carrolyn Meiers, MD ?Pharmacy:   ?[facility] [place] ?[place] [address] ?Phone: [phone] Fax: [phone] ? ?[place], [place] [address] ?[address] ?[place] [address] ?Phone: [phone] Fax: [phone] ? ? ? ? ?Social Determinants of Health (SDOH) Interventions ?  ? ?Readmission Risk Interventions ?   ? View : No data to display.  ?  ?  ?  ? ? ? ?

## 2021-06-05 NOTE — Assessment & Plan Note (Addendum)
-   causing severe iron deficiency and anemia ?- pt counseled she needs to see a gynecologist as soon as able  ?- she has documented history of uterine leiomyoma that may need more aggressive treatments at this point.  ?

## 2021-06-05 NOTE — Assessment & Plan Note (Signed)
-   transfusing 3 units PRBC total ?- start daily iron/vitamin C supplementation  ?- address menorrhagia with gynecology follow up  ?

## 2021-06-05 NOTE — ED Notes (Signed)
Patient transported to CT 

## 2021-06-05 NOTE — H&P (Signed)
?  ?  ? ? ? ?PATIENT NAME: [name] [name]   ? ?MR#:  [id number] ? ?DATE OF BIRTH:  1975-02-24 ? ?DATE OF ADMISSION:  06/04/2021 ? ?PRIMARY CARE PHYSICIAN: Carrolyn Meiers, MD  ? ?Patient is coming from: Home ? ?REQUESTING/REFERRING PHYSICIAN: Ripley Fraise, MD ? ?CHIEF COMPLAINT:  ? ?Chief Complaint  ?Patient presents with  ? Shortness of Breath  ? ? ?HISTORY OF PRESENT ILLNESS:  ?[name] [name] is a 46 y.o. African-American female with medical history significant for hypertension, DVT and PE, on anticoagulation with Coumadin, OSA on CPAP and tobacco abuse, who presented to the ER with acute onset of worsening dyspnea with associated cough productive of thick white sputum since Thursday as well as occasional chest pain with cough.  She admits to orthopnea and paroxysmal nocturnal dyspnea as well as dyspnea on exertion.  No worsening lower extremity edema.  She has been having cold chills but did not check her temperature and does not believe she had fever.  No nausea or vomiting or abdominal pain.  No dysuria, acute diarrhea or hematuria or flank pain. ? ?ED Course: She came to the ER, respiratory rate was 21 with otherwise normal vital signs.  Labs revealed a BNP of 537 and high-sensitivity troponin of 13.  CMP was remarkable for borderline potassium of 3.5 and sodium 133 with albumin of 3.1.  CBC showed hemoglobin of 6.9 hematocrit 25.8 with low RBC indices .H&H were 9.2 and 34.7 on 01/30/2021. ?EKG as reviewed by me : EKG showed sinus rhythm with rate of 93 with PACs, left anterior fascicular block and low voltage QRS. ?Imaging: Two-view chest x-ray showed vascular congestion with diffuse interstitial opacities, patchy left lower lobe airspace disease and diffuse interstitial thickening that may be secondary to pulmonary edema or atypical infection.  Chest CTA revealed no evidence for PE.  It showed groundglass opacities in the mid and lower lungs left greater than right that could reflect  edema or pneumonia/pneumonitis. ? ?The patient was given IV Rocephin and Zithromax.  She was typed and crossmatch and started on transfusion with 1 unit of packed red blood cells.  She will be admitted to a stepdown unit bed for further evaluation and management. ?PAST MEDICAL HISTORY:  ? ?Past Medical History:  ?Diagnosis Date  ? Abdominal cramps 08/30/2015  ? Anemia   ? Blood transfusion without reported diagnosis   ? Closed left ankle fracture   ? August 30 2012  ? Clotting disorder ([place])   ? DVT (deep venous thrombosis) ([place])   ? L leg  ? Fibroid tumor   ? Hidradenitis   ? High blood pressure   ? Hypertension   ? Obesity   ? OSA on CPAP   ? Oxygen deficiency   ? Pregnancy   ? 09/20/14 17 weeks  ? Pulmonary embolism ([place]) 04/2013  ? Sleep apnea   ? Tobacco use disorder   ? ? ?PAST SURGICAL HISTORY:  ? ?Past Surgical History:  ?Procedure Laterality Date  ? ABSCESS DRAINAGE Bilateral 01/10/15  ? COLONOSCOPY WITH PROPOFOL N/A 06/08/2018  ? Procedure: COLONOSCOPY WITH PROPOFOL;  Surgeon: Daneil Dolin, MD;  [facility];  Service: Endoscopy;  Laterality: N/A;  2:30pm  ? CYST REMOVAL TRUNK    ? IVC FILTER PLACEMENT ([facility] HX)    ? LOWER EXTREMITY VENOGRAPHY N/A 02/07/2021  ? Procedure: LOWER EXTREMITY VENOGRAPHY;  Surgeon: [name], MD;  Location: [facility];  Service: Cardiovascular;  Laterality: N/A;  ?  PERIPHERAL VASCULAR THROMBECTOMY N/A 02/07/2021  ? Procedure: PERIPHERAL VASCULAR THROMBECTOMY;  Surgeon: [name], MD;  Location: [facility];  Service: Cardiovascular;  Laterality: N/A;  ? ? ?SOCIAL HISTORY:  ? ?Social History  ? ?Tobacco Use  ? Smoking status: Some Days  ?  Packs/day: 0.25  ?  Years: 18.00  ?  Pack years: 4.50  ?  Types: Cigarettes  ? Smokeless tobacco: Never  ? Tobacco comments:  ?  smokes 3 cig a day [facility] 02/15/21  ?Substance Use Topics  ? Alcohol use: Yes  ?  Alcohol/week: 2.0 standard drinks  ?  Types: 2 Cans of beer per week  ?  Comment: twice a month   ? ? ?FAMILY HISTORY:  ? ?Family History  ?Problem Relation Age of Onset  ? Hypertension Mother   ? Diabetes Paternal Aunt   ? Diabetes Paternal Uncle   ? Other Father   ?     blood clots  ? Cancer Maternal Grandmother   ? Heart attack Paternal Grandmother   ? Colon cancer Neg Hx   ? ? ?DRUG ALLERGIES:  ? ?Allergies  ?Allergen Reactions  ? Sulfa Antibiotics Itching, Swelling and Rash  ?  Lips swelling  ? ? ?REVIEW OF SYSTEMS:  ? ?ROS ?As per history of present illness. All pertinent systems were reviewed above. Constitutional, HEENT, cardiovascular, respiratory, GI, GU, musculoskeletal, neuro, psychiatric, endocrine, integumentary and hematologic systems were reviewed and are otherwise negative/unremarkable except for positive findings mentioned above in the HPI. ? ? ?MEDICATIONS AT HOME:  ? ?Prior to Admission medications   ?Medication Sig Start Date End Date Taking? Authorizing Provider  ?amLODipine (NORVASC) 10 MG tablet Take 1 tablet (10 mg total) by mouth daily. 01/31/17   Caren Macadam, MD  ?cyclobenzaprine (FLEXERIL) 10 MG tablet Take 1 tablet (10 mg total) by mouth 2 (two) times daily as needed for muscle spasms. 01/28/21   Marcello Fennel, PA-C  ?enoxaparin (LOVENOX) 100 MG/ML injection Inject 1 mL (100 mg total) into the skin every 12 (twelve) hours for 2 days. 02/02/21 02/04/21  [name], MD  ?enoxaparin (LOVENOX) 30 MG/0.3ML injection Inject 0.3 mLs (30 mg total) into the skin every 12 (twelve) hours for 2 days. 02/02/21 02/04/21  [name], MD  ?triamterene-hydrochlorothiazide (MAXZIDE-25) 37.5-25 MG tablet TAKE 1 TABLET BY MOUTH ONCE A DAY. 06/20/17   Caren Macadam, MD  ?warfarin (COUMADIN) 10 MG tablet TAKE (1) OR (1) 1/2 TABLET BY MOUTH DAILY AS DIRECTED. 04/23/21   Arnoldo Lenis, MD  ? ?  ? ?VITAL SIGNS:  ?Blood pressure (!) 112/46, pulse 80, temperature 98 ?F (36.7 ?C), temperature source Oral, resp. rate (!) 24, height '5\' 10"'$  (1.778 m), weight 128.1 kg, last menstrual period  05/09/2021, SpO2 97 %. ? ?PHYSICAL EXAMINATION:  ?Physical Exam ? ?GENERAL:  46 y.o.-year-old African-American female patient lying in the bed with mild conversational dyspnea. ?EYES: Pupils equal, round, reactive to light and accommodation. No scleral icterus. Extraocular muscles intact.  ?HEENT: Head atraumatic, normocephalic. Oropharynx and nasopharynx clear.  ?NECK:  Supple, no jugular venous distention. No thyroid enlargement, no tenderness.  ?LUNGS: Diminished bibasilar breath sounds with bibasilar and left midlung zone crackles. ?CARDIOVASCULAR: Regular rate and rhythm, S1, S2 normal. No murmurs, rubs, or gallops.  ?ABDOMEN: Soft, nondistended, nontender. Bowel sounds present. No organomegaly or mass.  ?EXTREMITIES: Trace bilateral lower extremity pitting edema with no cyanosis, or clubbing.  ?NEUROLOGIC: Cranial nerves II through XII are intact. Muscle strength 5/5  in all extremities. Sensation intact. Gait not checked.  ?PSYCHIATRIC: The patient is alert and oriented x 3.  Normal affect and good eye contact. ?SKIN: No obvious rash, lesion, or ulcer.  ? ?LABORATORY PANEL:  ? ?CBC ?Recent Labs  ?Lab 06/04/21 ?2321  ?WBC 6.5  ?HGB 6.9*  ?HCT 25.8*  ?PLT 281  ? ?------------------------------------------------------------------------------------------------------------------ ? ?Chemistries  ?Recent Labs  ?Lab 06/04/21 ?2321  ?NA 133*  ?K 3.5  ?CL 103  ?CO2 25  ?GLUCOSE 99  ?BUN 16  ?CREATININE 0.73  ?CALCIUM 8.4*  ?AST 15  ?ALT 15  ?ALKPHOS 43  ?BILITOT 0.5  ? ?------------------------------------------------------------------------------------------------------------------ ? ?Cardiac Enzymes ?No results for input(s): TROPONINI in the last 168 hours. ?------------------------------------------------------------------------------------------------------------------ ? ?RADIOLOGY:  ?DG Chest 2 View ? ?Result Date: 06/04/2021 ?CLINICAL DATA:  Shortness of breath for 3 days with productive cough. Intermittent chest  pain and lethargy. EXAM: CHEST - 2 VIEW COMPARISON:  Radiographs 04/27/2013. Chest CT 10/21/2013. Abdominal CT 01/30/2021. FINDINGS: The heart size and mediastinal contours are normal. There is vascular congestion with

## 2021-06-05 NOTE — Telephone Encounter (Signed)
Patient was told to give a call to [name] [name] for coumadin. Patient is currently admitted in to hospital. ?

## 2021-06-05 NOTE — Telephone Encounter (Addendum)
Called.  No Answer @ [phone]  Left message for pt that I had called and will try to call back or she can return my call. ? ?No answer [phone] ? ? ?3:34PM ?CALLED PT BACK AND SPOKE WITH HER.  SHE JUST WANTED TO LET ME KNOW SHE IS IN THE HOSPITAL AND SHE WILL SET UP FOLLOW UP APPT WHEN SHE GETS OUT. ?

## 2021-06-05 NOTE — Assessment & Plan Note (Addendum)
-   Continue IV Lasix ?- This could be related to high-output failure though due to symptomatic anemia and possibly due to cor pulmonale. ?- 2D echo be obtained. ?- treating severe anemia with PRBC transfusion ?- monitoring weights, I/Os, electrolytes ?

## 2021-06-05 NOTE — Assessment & Plan Note (Signed)
-   will offer nicotine patch and counseling ?

## 2021-06-05 NOTE — Assessment & Plan Note (Addendum)
-   due to soft BPs will temporarily hold her home antihypertensives ?

## 2021-06-05 NOTE — Assessment & Plan Note (Signed)
-   The patient will be admitted to a stepdown unit bed. ?- Continue antibiotic therapy with IV Rocephin and Zithromax. ?- Mucolytic therapy and bronchodilator therapy will be provided as needed. ?- We will follow blood cultures. ?- We will follow O2 protocol. ?

## 2021-06-05 NOTE — Assessment & Plan Note (Signed)
-   We will continue her CPAP nightly. 

## 2021-06-05 NOTE — Hospital Course (Signed)
46 y.o. African-American female with medical history significant for hypertension, DVT and PE, on anticoagulation with Coumadin, OSA on CPAP and tobacco abuse, who presented to the ER with acute onset of worsening dyspnea with associated cough productive of thick white sputum since Thursday as well as occasional chest pain with cough.  She admits to orthopnea and paroxysmal nocturnal dyspnea as well as dyspnea on exertion.  No worsening lower extremity edema.  She has been having cold chills but did not check her temperature and does not believe she had fever.  No nausea or vomiting or abdominal pain.  No dysuria, acute diarrhea or hematuria or flank pain. ?  ?ED Course: She came to the ER, respiratory rate was 21 with otherwise normal vital signs.  Labs revealed a BNP of 537 and high-sensitivity troponin of 13.  CMP was remarkable for borderline potassium of 3.5 and sodium 133 with albumin of 3.1.  CBC showed hemoglobin of 6.9 hematocrit 25.8 with low RBC indices .H&H were 9.2 and 34.7 on 01/30/2021. ?EKG as reviewed by me : EKG showed sinus rhythm with rate of 93 with PACs, left anterior fascicular block and low voltage QRS. ?Imaging: Two-view chest x-ray showed vascular congestion with diffuse interstitial opacities, patchy left lower lobe airspace disease and diffuse interstitial thickening that may be secondary to pulmonary edema or atypical infection.  Chest CTA revealed no evidence for PE.  It showed groundglass opacities in the mid and lower lungs left greater than right that could reflect edema or pneumonia/pneumonitis. ? ?The patient was given IV Rocephin and Zithromax.  She was typed and crossmatch and started on transfusion with 1 unit of packed red blood cells.  She will be admitted to a stepdown unit bed for further evaluation and management. ? ?06/05/2021: Hg only up to 7.2.  Transfuse an additional 2 units of PRBC today.  Continue IV diuresis with lasix.   ?

## 2021-06-05 NOTE — Progress Notes (Signed)
ANTICOAGULATION CONSULT NOTE - Initial Consult ? ?Pharmacy Consult for warfarin ?Indication: history of PE ? ?Allergies  ?Allergen Reactions  ? Sulfa Antibiotics Itching, Swelling and Rash  ?  Lips swelling  ? ? ?Patient Measurements: ?Height: '5\' 10"'$  (177.8 cm) ?Weight: 128.1 kg (282 lb 6.6 oz) ?IBW/kg (Calculated) : 68.5 ?Heparin Dosing Weight:  ? ?Vital Signs: ?Temp: 98 ?F (36.7 ?C) (05/16 0732) ?Temp Source: Oral (05/16 0732) ?BP: 115/65 (05/16 1000) ?Pulse Rate: 83 (05/16 1000) ? ?Labs: ?Recent Labs  ?  06/04/21 ?2321 06/05/21 ?0750  ?HGB 6.9* 7.2*  7.2*  ?HCT 25.8* 26.3*  26.3*  ?PLT 281 258  ?LABPROT 22.7*  --   ?INR 2.0*  --   ?CREATININE 0.73 0.83  ?TROPONINIHS 13  --   ? ? ?Estimated Creatinine Clearance: 126 mL/min (by C-G formula based on SCr of 0.83 mg/dL). ? ? ?Medical History: ?Past Medical History:  ?Diagnosis Date  ? Abdominal cramps 08/30/2015  ? Anemia   ? Blood transfusion without reported diagnosis   ? Closed left ankle fracture   ? August 30 2012  ? Clotting disorder ([place])   ? DVT (deep venous thrombosis) ([place])   ? L leg  ? Fibroid tumor   ? Hidradenitis   ? High blood pressure   ? Hypertension   ? Obesity   ? OSA on CPAP   ? Oxygen deficiency   ? Pregnancy   ? 09/20/14 17 weeks  ? Pulmonary embolism ([place]) 04/2013  ? Sleep apnea   ? Tobacco use disorder   ? ? ?Medications:  ?Medications Prior to Admission  ?Medication Sig Dispense Refill Last Dose  ? amLODipine (NORVASC) 10 MG tablet Take 1 tablet (10 mg total) by mouth daily. 90 tablet 3 06/04/2021  ? triamterene-hydrochlorothiazide (MAXZIDE-25) 37.5-25 MG tablet TAKE 1 TABLET BY MOUTH ONCE A DAY. 30 tablet 3 06/04/2021  ? warfarin (COUMADIN) 10 MG tablet TAKE (1) OR (1) 1/2 TABLET BY MOUTH DAILY AS DIRECTED. 45 tablet 1 06/04/2021 at 0900  ? ? ?Assessment: ?Pharmacy consulted to dose warfarin in patient with history of pulmonary embolism.  Patient's home dose listed as 15 mg on Tuesday and Saturday and 10 mg ROW. INR on admission 2.0 with last dose  5/15 0900. ? ?Goal of Therapy:  ?INR 2-3 ?Monitor platelets by anticoagulation protocol: Yes ?  ?Plan:  ?Warfarin 15 mg x 1 dose. ?Monitor daily INR and s/s of bleeding. ? ?Ramond Craver ?06/05/2021,10:37 AM ? ? ?

## 2021-06-05 NOTE — Assessment & Plan Note (Signed)
-   check magnesium and replace as needed ?- add oral potassium supplement while on lasix  ?- follow basic metabolic panel  ?

## 2021-06-05 NOTE — ED Provider Notes (Signed)
?[facility] ?Provider Note ? ? ?CSN: [id number] ?Arrival date & time: 06/04/21  1300 ? ?  ? ?History ? ?Chief Complaint  ?Patient presents with  ? Shortness of Breath  ? ? ?[name] [name] is a 46 y.o. female. ? ?The history is provided by the patient.  ?Shortness of Breath ?Severity:  Moderate ?Onset quality:  Gradual ?Duration:  3 days ?Timing:  Intermittent ?Progression:  Worsening ?Chronicity:  New ?Relieved by:  Rest ?Worsened by:  Activity ?Associated symptoms: chest pain and cough   ?Associated symptoms: no fever, no hemoptysis and no vomiting   ?Associated symptoms comment:  CP with cough ?Patient with history of hypertension, venous thromboembolism on Coumadin presents with shortness of breath.  Patient reports for the past 3-4 days she has had increasing dyspnea on exertion.  She also reports cough without hemoptysis.  She reports chest pain with cough.  No fevers or vomiting.  She reports medication compliance. ?She does not typically get the symptoms.  She is not on chronic oxygen.  She is a daily smoker ? ?  ? ?Home Medications ?Prior to Admission medications   ?Medication Sig Start Date End Date Taking? Authorizing Provider  ?amLODipine (NORVASC) 10 MG tablet Take 1 tablet (10 mg total) by mouth daily. 01/31/17   Caren Macadam, MD  ?cyclobenzaprine (FLEXERIL) 10 MG tablet Take 1 tablet (10 mg total) by mouth 2 (two) times daily as needed for muscle spasms. 01/28/21   Marcello Fennel, PA-C  ?enoxaparin (LOVENOX) 100 MG/ML injection Inject 1 mL (100 mg total) into the skin every 12 (twelve) hours for 2 days. 02/02/21 02/04/21  [name], MD  ?enoxaparin (LOVENOX) 30 MG/0.3ML injection Inject 0.3 mLs (30 mg total) into the skin every 12 (twelve) hours for 2 days. 02/02/21 02/04/21  [name], MD  ?triamterene-hydrochlorothiazide (MAXZIDE-25) 37.5-25 MG tablet TAKE 1 TABLET BY MOUTH ONCE A DAY. 06/20/17   Caren Macadam, MD  ?warfarin (COUMADIN) 10 MG tablet TAKE (1) OR (1)  1/2 TABLET BY MOUTH DAILY AS DIRECTED. 04/23/21   Arnoldo Lenis, MD  ?   ? ?Allergies    ?Sulfa antibiotics   ? ?Review of Systems   ?Review of Systems  ?Constitutional:  Negative for fever.  ?Respiratory:  Positive for cough and shortness of breath. Negative for hemoptysis.   ?Cardiovascular:  Positive for chest pain.  ?     Chronic leg swelling  ?Gastrointestinal:  Negative for anal bleeding, blood in stool and vomiting.  ?Genitourinary:   ?     Reports heavy menstrual cycles, none currently  ? ?Physical Exam ?Updated Vital Signs ?BP 101/69   Pulse 93   Temp 99.4 ?F (37.4 ?C) (Rectal)   Resp 19   Ht 1.778 m ('5\' 10"'$ )   Wt 128.5 kg   LMP 05/09/2021   SpO2 94%   BMI 40.65 kg/m?  ?Physical Exam ?CONSTITUTIONAL: Well developed/well nourished ?HEAD: Normocephalic/atraumatic ?EYES: EOMI/PERRL ?ENMT: Mucous membranes moist ?NECK: supple no meningeal signs ?CV: S1/S2 noted, no murmurs/rubs/gallops noted ?LUNGS: Bibasilar crackles noted ?ABDOMEN: soft, nontender, no rebound or guarding, bowel sounds noted throughout abdomen ?GU:no cva tenderness ?NEURO: Pt is awake/alert/appropriate, moves all extremitiesx4.  No facial droop.   ?EXTREMITIES: pulses normal/equal, full ROM, chronic edema to lower extremities ?SKIN: warm, color normal ?PSYCH: no abnormalities of mood noted, alert and oriented to situation ? ?ED Results / Procedures / Treatments   ?Labs ?(all labs ordered are listed, but only abnormal results are displayed) ?Labs Reviewed  ?  CBC - Abnormal; Notable for the following components:  ?    Result Value  ? Hemoglobin 6.9 (*)   ? HCT 25.8 (*)   ? MCV 65.0 (*)   ? MCH 17.4 (*)   ? MCHC 26.7 (*)   ? RDW 22.6 (*)   ? All other components within normal limits  ?COMPREHENSIVE METABOLIC PANEL - Abnormal; Notable for the following components:  ? Sodium 133 (*)   ? Calcium 8.4 (*)   ? Albumin 3.1 (*)   ? All other components within normal limits  ?PROTIME-INR - Abnormal; Notable for the following components:  ?  Prothrombin Time 22.7 (*)   ? INR 2.0 (*)   ? All other components within normal limits  ?BRAIN NATRIURETIC PEPTIDE - Abnormal; Notable for the following components:  ? B Natriuretic Peptide 537.0 (*)   ? All other components within normal limits  ?RESP PANEL BY RT-PCR (FLU A&B, COVID) [id number]  ?LACTIC ACID, PLASMA  ?POC OCCULT BLOOD, ED  ?TYPE AND SCREEN  ?TROPONIN I (HIGH SENSITIVITY)  ? ? ?EKG ?EKG Interpretation ? ?Date/Time:  Monday Jun 04 2021 13:40:32 EDT ?Ventricular Rate:  93 ?PR Interval:  138 ?QRS Duration: 96 ?QT Interval:  352 ?QTC Calculation: 437 ?R Axis:   -56 ?Text Interpretation: Sinus rhythm with Premature atrial complexes with Abberant conduction Low voltage QRS Left anterior fascicular block Inferior-posterior infarct , age undetermined Anterolateral infarct , age undetermined Abnormal ECG Confirmed by Ripley Fraise [phone]) on 06/04/2021 11:08:58 PM ? ?Radiology ?DG Chest 2 View ? ?Result Date: 06/04/2021 ?CLINICAL DATA:  Shortness of breath for 3 days with productive cough. Intermittent chest pain and lethargy. EXAM: CHEST - 2 VIEW COMPARISON:  Radiographs 04/27/2013. Chest CT 10/21/2013. Abdominal CT 01/30/2021. FINDINGS: The heart size and mediastinal contours are normal. There is vascular congestion with new interstitial opacities in both lungs, diffuse fissural thickening and patchy left lower lobe airspace disease. No pneumothorax or significant pleural effusion. The bones appear unchanged. There are degenerative changes throughout the thoracic spine. IMPRESSION: Vascular congestion with diffuse interstitial opacities, patchy left lower lobe airspace disease and diffuse interstitial thickening. Findings may be secondary to pulmonary edema or atypical infection. Electronically Signed   By: Richardean Sale M.D.   On: 06/04/2021 14:10  ? ?CT Angio Chest PE W and/or Wo Contrast ? ?Result Date: 06/05/2021 ?CLINICAL DATA:  Pulmonary embolism (PE) suspected, high prob. Productive cough, shortness  of breath, occasional chest pain EXAM: CT ANGIOGRAPHY CHEST WITH CONTRAST TECHNIQUE: Multidetector CT imaging of the chest was performed using the standard protocol during bolus administration of intravenous contrast. Multiplanar CT image reconstructions and MIPs were obtained to evaluate the vascular anatomy. RADIATION DOSE REDUCTION: This exam was performed according to the departmental dose-optimization program which includes automated exposure control, adjustment of the mA and/or kV according to patient size and/or use of iterative reconstruction technique. CONTRAST:  177m OMNIPAQUE IOHEXOL 350 MG/ML SOLN COMPARISON:  10/21/2013 FINDINGS: Cardiovascular: Heart is normal size. Aorta is normal caliber. No filling defects in the pulmonary arteries to suggest pulmonary emboli. Mediastinum/Nodes: No mediastinal, hilar, or axillary adenopathy. Trachea and esophagus are unremarkable. Thyroid unremarkable. Lungs/Pleura: Ground-glass opacities in the mid and lower lungs. This could reflect edema or atypical infection/pneumonitis. No effusions. Upper Abdomen: No acute findings Musculoskeletal: Chest wall soft tissues are unremarkable. No acute bony abnormality. Review of the MIP images confirms the above findings. IMPRESSION: No evidence of pulmonary embolus. Ground-glass opacities in the mid and lower lungs, left greater than right.  This could reflect edema or pneumonia/pneumonitis. Electronically Signed   By: Rolm Baptise M.D.   On: 06/05/2021 01:15   ? ?Procedures ?Marland KitchenCritical Care ?Performed by: Ripley Fraise, MD ?Authorized by: Ripley Fraise, MD  ? ?Critical care provider statement:  ?  Critical care time (minutes):  82 ?  Critical care start time:  06/05/2021 12:33 AM ?  Critical care end time:  06/05/2021 1:55 AM ?  Critical care time was exclusive of:  Separately billable procedures and treating other patients ?  Critical care was necessary to treat or prevent imminent or life-threatening deterioration of the  following conditions:  Respiratory failure ?  Critical care was time spent personally by me on the following activities:  Pulse oximetry, ordering and review of radiographic studies, ordering and review of laborator

## 2021-06-06 DIAGNOSIS — J189 Pneumonia, unspecified organism: Secondary | ICD-10-CM | POA: Diagnosis not present

## 2021-06-06 DIAGNOSIS — I10 Essential (primary) hypertension: Secondary | ICD-10-CM | POA: Diagnosis not present

## 2021-06-06 DIAGNOSIS — D649 Anemia, unspecified: Secondary | ICD-10-CM | POA: Diagnosis not present

## 2021-06-06 DIAGNOSIS — I5033 Acute on chronic diastolic (congestive) heart failure: Secondary | ICD-10-CM | POA: Diagnosis not present

## 2021-06-06 LAB — BPAM RBC
Blood Product Expiration Date: 202306142359
Blood Product Expiration Date: 202306162359
Blood Product Expiration Date: 202306162359
ISSUE DATE / TIME: 202305160258
ISSUE DATE / TIME: 202305161237
ISSUE DATE / TIME: 202305161457
Unit Type and Rh: 6200
Unit Type and Rh: 6200
Unit Type and Rh: 6200

## 2021-06-06 LAB — BASIC METABOLIC PANEL
Anion gap: 4 — ABNORMAL LOW (ref 5–15)
BUN: 16 mg/dL (ref 6–20)
CO2: 25 mmol/L (ref 22–32)
Calcium: 8.1 mg/dL — ABNORMAL LOW (ref 8.9–10.3)
Chloride: 107 mmol/L (ref 98–111)
Creatinine, Ser: 0.75 mg/dL (ref 0.44–1.00)
GFR, Estimated: >60 mL/min (ref >60)
Glucose, Bld: 83 mg/dL (ref 70–99)
Potassium: 3.9 mmol/L (ref 3.5–5.1)
Sodium: 136 mmol/L (ref 135–145)

## 2021-06-06 LAB — TYPE AND SCREEN
ABO/RH(D): A POS
Antibody Screen: NEGATIVE
Unit division: 0
Unit division: 0
Unit division: 0

## 2021-06-06 LAB — CBC
HCT: 28.7 % — ABNORMAL LOW (ref 36.0–46.0)
Hemoglobin: 7.9 g/dL — ABNORMAL LOW (ref 12.0–15.0)
MCH: 19.1 pg — ABNORMAL LOW (ref 26.0–34.0)
MCHC: 27.5 g/dL — ABNORMAL LOW (ref 30.0–36.0)
MCV: 69.3 fL — ABNORMAL LOW (ref 80.0–100.0)
Platelets: 226 10*3/uL (ref 150–400)
RBC: 4.14 MIL/uL (ref 3.87–5.11)
RDW: 24.3 % — ABNORMAL HIGH (ref 11.5–15.5)
WBC: 7.2 10*3/uL (ref 4.0–10.5)
nRBC: 0 % (ref 0.0–0.2)

## 2021-06-06 LAB — MAGNESIUM: Magnesium: 1.9 mg/dL (ref 1.7–2.4)

## 2021-06-06 LAB — PROTIME-INR
INR: 1.8 — ABNORMAL HIGH (ref 0.8–1.2)
Prothrombin Time: 20.6 seconds — ABNORMAL HIGH (ref 11.4–15.2)

## 2021-06-06 MED ORDER — WARFARIN SODIUM 5 MG PO TABS
10.0000 mg | ORAL_TABLET | Freq: Once | ORAL | Status: AC
  Administered 2021-06-06: 10 mg via ORAL
  Filled 2021-06-06: qty 2

## 2021-06-06 NOTE — Progress Notes (Signed)
Received report from [facility], pt resting with family at bedside. Nad. Denies pain or any other symptoms. Pt states "I feel fine". Nad. No sob/resp distress noted. No generalized weakness noted. No needs expressed at this time. ?

## 2021-06-06 NOTE — Progress Notes (Signed)
PROGRESS NOTE   [name] [name]  PVV:[id number] DOB: 03-13-1975 DOA: 06/04/2021 PCP: Carrolyn Meiers, MD   Chief Complaint  Patient presents with   Shortness of Breath   Level of care: Telemetry  Brief Admission History:  46 y.o. African-American female with medical history significant for hypertension, DVT and PE, on anticoagulation with Coumadin, OSA on CPAP and tobacco abuse, who presented to the ER with acute onset of worsening dyspnea with associated cough productive of thick white sputum since Thursday as well as occasional chest pain with cough.  She admits to orthopnea and paroxysmal nocturnal dyspnea as well as dyspnea on exertion.  No worsening lower extremity edema.  She has been having cold chills but did not check her temperature and does not believe she had fever.  No nausea or vomiting or abdominal pain.  No dysuria, acute diarrhea or hematuria or flank pain.   ED Course: She came to the ER, respiratory rate was 21 with otherwise normal vital signs.  Labs revealed a BNP of 537 and high-sensitivity troponin of 13.  CMP was remarkable for borderline potassium of 3.5 and sodium 133 with albumin of 3.1.  CBC showed hemoglobin of 6.9 hematocrit 25.8 with low RBC indices .H&H were 9.2 and 34.7 on 01/30/2021. EKG as reviewed by me : EKG showed sinus rhythm with rate of 93 with PACs, left anterior fascicular block and low voltage QRS. Imaging: Two-view chest x-ray showed vascular congestion with diffuse interstitial opacities, patchy left lower lobe airspace disease and diffuse interstitial thickening that may be secondary to pulmonary edema or atypical infection.  Chest CTA revealed no evidence for PE.  It showed groundglass opacities in the mid and lower lungs left greater than right that could reflect edema or pneumonia/pneumonitis.  The patient was given IV Rocephin and Zithromax.  She was typed and crossmatch and started on transfusion with 1 unit of packed red blood cells.  She  will be admitted to a stepdown unit bed for further evaluation and management.  06/05/2021: Hg only up to 7.2.  Transfuse an additional 2 units of PRBC today.  Continue IV diuresis with lasix.     Assessment and Plan: * CAP (community acquired bacterial pneumonia) - Clinically improving okay to transfer out of stepdown to medical floor -c/n IV Rocephin and Zithromax, will give a low-dose of mucolytic - -MRSA screen negative COVID and flu negative  Symptomatic anemia - This is likely contributing to her acute heart failure. - Patient received 3 units of PRBC  - -Outpatient follow-up with gynecologist advised  HFmREF-Acute on chronic systolic dysfunction CHF /PAH - Continue IV Lasix - Anemia and pneumonia contributing . -Echo with EF of 45 to 50% LAD ventricle with global hypokinesis and ventricular dilatation and hypertrophy -Patient appears to have severe pulmonary hypertension -Outpatient follow-up with cardiologist recommended -  OSA on CPAP - continue her CPAP nightly.  Hypokalemia - Replace and recheck especially while on Lasix  Essential hypertension - Home BP meds on hold due to soft BP and to give room for diuresis  Menorrhagia--in the setting of uterine leiomyoma - causing severe iron deficiency and anemia -Transfused 2 units of PRBC -Outpatient follow-up with GYN emphasized  History of pulmonary embolism - Continue Coumadin therapy - she reports she has been on warfarin since 2015 and plan was for lifetime anticoagulation  Severe Iron deficiency anemia due to menorrhagia - transfused 3 units PRBC total -Continue iron supplementation - address menorrhagia with gynecology follow up   Tobacco  use disorder - Smoking cessation advised  -Transfer out of stepdown to telemetry unit on 06/06/2021   DVT prophylaxis: therapeutic warfarin  Code Status: full  Family Communication: discussed plan with patient who verbalized understanding Disposition: Status is:  Inpatient Remains inpatient appropriate because: requires IV lasix for diuresis    Consultants:   Procedures:  PRBC transfusion 06/05/21 Antimicrobials:  Azithromycin 5/16>> Ceftriaxone 5/16>>   Subjective: -Patient's significant other at bedside  Patient feels like she is about to have the beginning of her menstrual flow -No chest pain, dyspnea on exertion persist  Objective: Vitals:   06/06/21 1000 06/06/21 1124 06/06/21 1327 06/06/21 1653  BP:   109/61 (!) 104/55  Pulse:   89 89  Resp:   16 18  Temp:  98.4 F (36.9 C)  98.4 F (36.9 C)  TempSrc:  Oral  Oral  SpO2: 98%  92% 93%  Weight:      Height:        Intake/Output Summary (Last 24 hours) at 06/06/2021 1909 Last data filed at 06/06/2021 1900 Gross per 24 hour  Intake 630 ml  Output 2000 ml  Net -1370 ml   Filed Weights   06/04/21 1335 06/05/21 0238 06/06/21 0800  Weight: 128.5 kg 128.1 kg 126.3 kg   Examination: Gen:- Awake Alert, in no acute distress  HEENT:- [place].AT, No sclera icterus Neck-Supple Neck, +ve JVD,.  Lungs-improving air movement, no wheezing  CV- S1, S2 normal, RRR Abd-  +ve B.Sounds, Abd Soft, No tenderness,    Extremity/Skin:-Trace edema,   good pedal pulses  Psych-affect is appropriate, oriented x3 Neuro-no new focal deficits, no tremors   Data Reviewed: I have personally reviewed following labs and imaging studies  CBC: Recent Labs  Lab 06/04/21 2321 06/05/21 0750 06/05/21 1827 06/06/21 0416  WBC 6.5 6.9  --  7.2  HGB 6.9* 7.2*  7.2* 8.8* 7.9*  HCT 25.8* 26.3*  26.3* 31.6* 28.7*  MCV 65.0* 66.6*  --  69.3*  PLT 281 258  --  616    Basic Metabolic Panel: Recent Labs  Lab 06/04/21 2321 06/05/21 0750 06/06/21 0416  NA 133* 134* 136  K 3.5 3.4* 3.9  CL 103 102 107  CO2 '25 25 25  '$ GLUCOSE 99 96 83  BUN '16 14 16  '$ CREATININE 0.73 0.83 0.75  CALCIUM 8.4* 8.2* 8.1*  MG  --  2.1 1.9    CBG: No results for input(s): GLUCAP in the last 168 hours.  Recent Results  (from the past 240 hour(s))  Resp Panel by RT-PCR (Flu A&B, Covid) Nasopharyngeal Swab     Status: None   Collection Time: 06/05/21  1:25 AM   Specimen: Nasopharyngeal Swab; Nasopharyngeal(NP) swabs in vial transport medium  Result Value Ref Range Status   SARS Coronavirus 2 by RT PCR NEGATIVE NEGATIVE Final    Comment: (NOTE) SARS-CoV-2 target nucleic acids are NOT DETECTED.  The SARS-CoV-2 RNA is generally detectable in upper respiratory specimens during the acute phase of infection. The lowest concentration of SARS-CoV-2 viral copies this assay can detect is 138 copies/mL. A negative result does not preclude SARS-Cov-2 infection and should not be used as the sole basis for treatment or other patient management decisions. A negative result may occur with  improper specimen collection/handling, submission of specimen other than nasopharyngeal swab, presence of viral mutation(s) within the areas targeted by this assay, and inadequate number of viral copies(<138 copies/mL). A negative result must be combined with clinical observations, patient history, and epidemiological  information. The expected result is Negative.  Fact Sheet for Patients:  EntrepreneurPulse.com.au  Fact Sheet for Healthcare Providers:  IncredibleEmployment.be  This test is no t yet approved or cleared by the [place] FDA and  has been authorized for detection and/or diagnosis of SARS-CoV-2 by FDA under an Emergency Use Authorization (EUA). This EUA will remain  in effect (meaning this test can be used) for the duration of the COVID-19 declaration under Section 564(b)(1) of the Act, 21 U.S.C.section 360bbb-3(b)(1), unless the authorization is terminated  or revoked sooner.       Influenza A by PCR NEGATIVE NEGATIVE Final   Influenza B by PCR NEGATIVE NEGATIVE Final    Comment: (NOTE) The Xpert Xpress SARS-CoV-2/FLU/RSV plus assay is intended as an aid in the  diagnosis of influenza from Nasopharyngeal swab specimens and should not be used as a sole basis for treatment. Nasal washings and aspirates are unacceptable for Xpert Xpress SARS-CoV-2/FLU/RSV testing.  Fact Sheet for Patients: EntrepreneurPulse.com.au  Fact Sheet for Healthcare Providers: IncredibleEmployment.be  This test is not yet approved or cleared by the [place] FDA and has been authorized for detection and/or diagnosis of SARS-CoV-2 by FDA under an Emergency Use Authorization (EUA). This EUA will remain in effect (meaning this test can be used) for the duration of the COVID-19 declaration under Section 564(b)(1) of the Act, 21 U.S.C. section 360bbb-3(b)(1), unless the authorization is terminated or revoked.  Performed at [facility], [address] [place], [place] [address]   MRSA Next Gen by PCR, Nasal     Status: None   Collection Time: 06/05/21  2:24 AM   Specimen: Nasal Mucosa; Nasal Swab  Result Value Ref Range Status   MRSA by PCR Next Gen NOT DETECTED NOT DETECTED Final    Comment: (NOTE) The GeneXpert MRSA Assay (FDA approved for NASAL specimens only), is one component of a comprehensive MRSA colonization surveillance program. It is not intended to diagnose MRSA infection nor to guide or monitor treatment for MRSA infections. Test performance is not FDA approved in patients less than 64 years old. Performed at [facility], [address], [place], [place] [address]      Radiology Studies: CT Angio Chest PE W and/or Wo Contrast  Result Date: 06/05/2021 CLINICAL DATA:  Pulmonary embolism (PE) suspected, high prob. Productive cough, shortness of breath, occasional chest pain EXAM: CT ANGIOGRAPHY CHEST WITH CONTRAST TECHNIQUE: Multidetector CT imaging of the chest was performed using the standard protocol during bolus administration of intravenous contrast. Multiplanar CT image reconstructions and MIPs were obtained to  evaluate the vascular anatomy. RADIATION DOSE REDUCTION: This exam was performed according to the departmental dose-optimization program which includes automated exposure control, adjustment of the mA and/or kV according to patient size and/or use of iterative reconstruction technique. CONTRAST:  169m OMNIPAQUE IOHEXOL 350 MG/ML SOLN COMPARISON:  10/21/2013 FINDINGS: Cardiovascular: Heart is normal size. Aorta is normal caliber. No filling defects in the pulmonary arteries to suggest pulmonary emboli. Mediastinum/Nodes: No mediastinal, hilar, or axillary adenopathy. Trachea and esophagus are unremarkable. Thyroid unremarkable. Lungs/Pleura: Ground-glass opacities in the mid and lower lungs. This could reflect edema or atypical infection/pneumonitis. No effusions. Upper Abdomen: No acute findings Musculoskeletal: Chest wall soft tissues are unremarkable. No acute bony abnormality. Review of the MIP images confirms the above findings. IMPRESSION: No evidence of pulmonary embolus. Ground-glass opacities in the mid and lower lungs, left greater than right. This could reflect edema or pneumonia/pneumonitis. Electronically Signed   By: KRolm BaptiseM.D.  On: 06/05/2021 01:15   ECHOCARDIOGRAM COMPLETE  Result Date: 06/05/2021    ECHOCARDIOGRAM REPORT   Patient Name:   [name] [name] Date of Exam: 06/05/2021 Medical Rec #:  [id number]       Height:       70.0 in Accession #:    [id number]      Weight:       282.4 lb Date of Birth:  12/12/1975       BSA:          2.417 m Patient Age:    39 years        BP:           115/65 mmHg Patient Gender: F               HR:           84 bpm. Exam Location:  [facility] Procedure: 2D Echo, Cardiac Doppler, Color Doppler and Intracardiac            Opacification Agent Indications:    CHF  History:        Patient has prior history of Echocardiogram examinations, most                 recent 09/03/2017. CHF, Pulmonary HTN, Signs/Symptoms:Shortness                 of Breath; Risk  Factors:Hypertension and Current Smoker. [place]                 embolus of Pulmonary artery, S/P IVC filter.  Sonographer:    Wenda Low Referring Phys: [id number] JAN A [place]  1. Left ventricular ejection fraction, by estimation, is 45 to 50%. The left ventricle has mildly decreased function. The left ventricle demonstrates global hypokinesis. The left ventricular internal cavity size was moderately dilated. There is mild left ventricular hypertrophy. Left ventricular diastolic parameters are indeterminate.  2. Right ventricular systolic function is normal. The right ventricular size is normal. There is severely elevated pulmonary artery systolic pressure.  3. Left atrial size was severely dilated.  4. Right atrial size was mildly dilated.  5. The mitral valve is abnormal. Mild mitral valve regurgitation. No evidence of mitral stenosis.  6. The tricuspid valve is abnormal.  7. The aortic valve is tricuspid. Aortic valve regurgitation is not visualized. No aortic stenosis is present.  8. The inferior vena cava is normal in size with greater than 50% respiratory variability, suggesting right atrial pressure of 3 mmHg. FINDINGS  Left Ventricle: Left ventricular ejection fraction, by estimation, is 45 to 50%. The left ventricle has mildly decreased function. The left ventricle demonstrates global hypokinesis. Definity contrast agent was given IV to delineate the left ventricular  endocardial borders. The left ventricular internal cavity size was moderately dilated. There is mild left ventricular hypertrophy. Left ventricular diastolic parameters are indeterminate. Right Ventricle: The right ventricular size is normal. Right vetricular wall thickness was not well visualized. Right ventricular systolic function is normal. There is severely elevated pulmonary artery systolic pressure. The tricuspid regurgitant velocity is 3.64 m/s, and with an assumed right atrial pressure of 8 mmHg, the estimated right  ventricular systolic pressure is 58.5 mmHg. Left Atrium: Left atrial size was severely dilated. Right Atrium: Right atrial size was mildly dilated. Pericardium: There is no evidence of pericardial effusion. Mitral Valve: The mitral valve is abnormal. Mild mitral valve regurgitation. No evidence of mitral valve stenosis. MV peak gradient, 10.0 mmHg. The mean mitral valve gradient  is 3.0 mmHg. Tricuspid Valve: The tricuspid valve is abnormal. Tricuspid valve regurgitation is mild . No evidence of tricuspid stenosis. Aortic Valve: The aortic valve is tricuspid. Aortic valve regurgitation is not visualized. No aortic stenosis is present. Aortic valve mean gradient measures 4.0 mmHg. Aortic valve peak gradient measures 7.4 mmHg. Aortic valve area, by VTI measures 3.22 cm. Pulmonic Valve: The pulmonic valve was not well visualized. Pulmonic valve regurgitation is not visualized. No evidence of pulmonic stenosis. Aorta: The aortic root is normal in size and structure. Venous: The inferior vena cava is normal in size with greater than 50% respiratory variability, suggesting right atrial pressure of 3 mmHg. IAS/Shunts: No atrial level shunt detected by color flow Doppler.  LEFT VENTRICLE PLAX 2D LVIDd:         6.60 cm      Diastology LVIDs:         5.00 cm      LV e' medial:    9.03 cm/s LV PW:         1.30 cm      LV E/e' medial:  14.4 LV IVS:        1.20 cm      LV e' lateral:   9.36 cm/s LVOT diam:     2.20 cm      LV E/e' lateral: 13.9 LV SV:         86 LV SV Index:   36 LVOT Area:     3.80 cm  LV Volumes (MOD) LV vol d, MOD A4C: 167.0 ml LV vol s, MOD A4C: 72.6 ml LV SV MOD A4C:     167.0 ml RIGHT VENTRICLE RV Basal diam:  4.45 cm RV S prime:     11.40 cm/s LEFT ATRIUM              Index        RIGHT ATRIUM           Index LA diam:        5.60 cm  2.32 cm/m   RA Area:     22.20 cm LA Vol (A2C):   127.0 ml 52.54 ml/m  RA Volume:   72.00 ml  29.79 ml/m LA Vol (A4C):   136.0 ml 56.27 ml/m LA Biplane Vol: 132.0 ml  54.61 ml/m  AORTIC VALVE                    PULMONIC VALVE AV Area (Vmax):    3.19 cm     PV Vmax:       0.63 m/s AV Area (Vmean):   3.11 cm     PV Peak grad:  1.6 mmHg AV Area (VTI):     3.22 cm AV Vmax:           136.00 cm/s AV Vmean:          89.800 cm/s AV VTI:            0.268 m AV Peak Grad:      7.4 mmHg AV Mean Grad:      4.0 mmHg LVOT Vmax:         114.00 cm/s LVOT Vmean:        73.500 cm/s LVOT VTI:          0.227 m LVOT/AV VTI ratio: 0.85  AORTA Ao Root diam: 2.90 cm MITRAL VALVE                TRICUSPID VALVE MV Area (PHT):  3.83 cm     TR Peak grad:   53.0 mmHg MV Area VTI:   2.47 cm     TR Vmax:        364.00 cm/s MV Peak grad:  10.0 mmHg MV Mean grad:  3.0 mmHg     SHUNTS MV Vmax:       1.58 m/s     Systemic VTI:  0.23 m MV Vmean:      78.9 cm/s    Systemic Diam: 2.20 cm MV Decel Time: 198 msec MV E velocity: 130.00 cm/s MV A velocity: 60.40 cm/s MV E/A ratio:  2.15 Carlyle Dolly MD Electronically signed by Carlyle Dolly MD Signature Date/Time: 06/05/2021/12:14:52 PM    Final     Scheduled Meds:  amLODipine  10 mg Oral Daily   Chlorhexidine Gluconate Cloth  6 each Topical Daily   cholecalciferol  1,000 Units Oral Daily   furosemide  40 mg Intravenous Q12H   guaiFENesin  600 mg Oral BID   Warfarin - Pharmacist Dosing Inpatient   Does not apply q1600   Continuous Infusions:  azithromycin 500 mg (06/06/21 0544)   cefTRIAXone (ROCEPHIN)  IV 2 g (06/06/21 0236)    LOS: 1 day   Roxan Hockey, MD How to contact the [facility] Attending or Consulting provider [place] or covering provider during after hours [place], for this patient?  Check the care team in [facility] and look for a) attending/consulting [facility] provider listed and b) the [facility] team listed Log into [email or web address] and use [place]'s universal password to access. If you do not have the password, please contact the hospital operator. Locate the [facility] provider you are looking for under [facility] and page to a number that you can  be directly reached. If you still have difficulty reaching the provider, please page the [facility] (Director on Call) for the Hospitalists listed on amion for assistance.  06/06/2021, 7:09 PM

## 2021-06-06 NOTE — Progress Notes (Signed)
[name] [name] with [facility] called to offer information on a program [facility] has called [place] if patient may need IV medications, tube feedings at home, etc. [phone] ?

## 2021-06-06 NOTE — Progress Notes (Signed)
Pt on CPAP for the night ?

## 2021-06-06 NOTE — Progress Notes (Signed)
ANTICOAGULATION CONSULT NOTE -  ? ?Pharmacy Consult for warfarin ?Indication: history of PE ? ?Allergies  ?Allergen Reactions  ? Sulfa Antibiotics Itching, Swelling and Rash  ?  Lips swelling  ? ? ?Patient Measurements: ?Height: '5\' 10"'$  (177.8 cm) ?Weight: 128.1 kg (282 lb 6.6 oz) ?IBW/kg (Calculated) : 68.5 ?Heparin Dosing Weight:  ? ?Vital Signs: ?Temp: 97.6 ?F (36.4 ?C) (05/17 7680) ?Temp Source: Oral (05/17 8811) ?Pulse Rate: 73 (05/17 0400) ? ?Labs: ?Recent Labs  ?  06/04/21 ?2321 06/05/21 ?0750 06/05/21 ?1827 06/06/21 ?0416  ?HGB 6.9* 7.2*  7.2* 8.8* 7.9*  ?HCT 25.8* 26.3*  26.3* 31.6* 28.7*  ?PLT 281 258  --  226  ?LABPROT 22.7*  --   --  20.6*  ?INR 2.0*  --   --  1.8*  ?CREATININE 0.73 0.83  --  0.75  ?TROPONINIHS 13  --   --   --   ? ? ? ?Estimated Creatinine Clearance: 130.8 mL/min (by C-G formula based on SCr of 0.75 mg/dL). ? ? ?Medical History: ?Past Medical History:  ?Diagnosis Date  ? Abdominal cramps 08/30/2015  ? Anemia   ? Blood transfusion without reported diagnosis   ? Closed left ankle fracture   ? August 30 2012  ? Clotting disorder ([place])   ? DVT (deep venous thrombosis) ([place])   ? L leg  ? Fibroid tumor   ? Hidradenitis   ? High blood pressure   ? Hypertension   ? Obesity   ? OSA on CPAP   ? Oxygen deficiency   ? Pregnancy   ? 09/20/14 17 weeks  ? Pulmonary embolism ([place]) 04/2013  ? Sleep apnea   ? Tobacco use disorder   ? ? ?Medications:  ?Medications Prior to Admission  ?Medication Sig Dispense Refill Last Dose  ? amLODipine (NORVASC) 10 MG tablet Take 1 tablet (10 mg total) by mouth daily. 90 tablet 3 06/04/2021  ? triamterene-hydrochlorothiazide (MAXZIDE-25) 37.5-25 MG tablet TAKE 1 TABLET BY MOUTH ONCE A DAY. 30 tablet 3 06/04/2021  ? warfarin (COUMADIN) 10 MG tablet TAKE (1) OR (1) 1/2 TABLET BY MOUTH DAILY AS DIRECTED. 45 tablet 1 06/04/2021 at 0900  ? ? ?Assessment: ?Pharmacy consulted to dose warfarin in patient with history of pulmonary embolism.  Patient's home dose listed as 15 mg on  Tuesday and Saturday and 10 mg ROW.  ?INR 1.8 ? ?Goal of Therapy:  ?INR 2-3 ?Monitor platelets by anticoagulation protocol: Yes ?  ?Plan:  ?Warfarin 10 mg x 1 dose. ?Monitor daily INR and s/s of bleeding. ? ?Margot Ables, PharmD ?Clinical Pharmacist ?06/06/2021 7:48 AM ? ? ? ?

## 2021-06-07 ENCOUNTER — Inpatient Hospital Stay (HOSPITAL_COMMUNITY): Payer: Medicare HMO

## 2021-06-07 DIAGNOSIS — D500 Iron deficiency anemia secondary to blood loss (chronic): Secondary | ICD-10-CM

## 2021-06-07 DIAGNOSIS — D649 Anemia, unspecified: Secondary | ICD-10-CM | POA: Diagnosis not present

## 2021-06-07 DIAGNOSIS — I5033 Acute on chronic diastolic (congestive) heart failure: Secondary | ICD-10-CM | POA: Diagnosis not present

## 2021-06-07 DIAGNOSIS — J811 Chronic pulmonary edema: Secondary | ICD-10-CM | POA: Diagnosis not present

## 2021-06-07 DIAGNOSIS — Z86711 Personal history of pulmonary embolism: Secondary | ICD-10-CM | POA: Diagnosis not present

## 2021-06-07 DIAGNOSIS — I10 Essential (primary) hypertension: Secondary | ICD-10-CM | POA: Diagnosis not present

## 2021-06-07 DIAGNOSIS — J189 Pneumonia, unspecified organism: Secondary | ICD-10-CM | POA: Diagnosis not present

## 2021-06-07 DIAGNOSIS — R059 Cough, unspecified: Secondary | ICD-10-CM | POA: Diagnosis not present

## 2021-06-07 LAB — BASIC METABOLIC PANEL
Anion gap: 9 (ref 5–15)
BUN: 19 mg/dL (ref 6–20)
CO2: 25 mmol/L (ref 22–32)
Calcium: 8.5 mg/dL — ABNORMAL LOW (ref 8.9–10.3)
Chloride: 102 mmol/L (ref 98–111)
Creatinine, Ser: 0.84 mg/dL (ref 0.44–1.00)
GFR, Estimated: >60 mL/min (ref >60)
Glucose, Bld: 85 mg/dL (ref 70–99)
Potassium: 3.9 mmol/L (ref 3.5–5.1)
Sodium: 136 mmol/L (ref 135–145)

## 2021-06-07 LAB — CBC
HCT: 30.2 % — ABNORMAL LOW (ref 36.0–46.0)
Hemoglobin: 8 g/dL — ABNORMAL LOW (ref 12.0–15.0)
MCH: 18.9 pg — ABNORMAL LOW (ref 26.0–34.0)
MCHC: 26.5 g/dL — ABNORMAL LOW (ref 30.0–36.0)
MCV: 71.2 fL — ABNORMAL LOW (ref 80.0–100.0)
Platelets: 233 10*3/uL (ref 150–400)
RBC: 4.24 MIL/uL (ref 3.87–5.11)
RDW: 25.2 % — ABNORMAL HIGH (ref 11.5–15.5)
WBC: 9.6 10*3/uL (ref 4.0–10.5)
nRBC: 0 % (ref 0.0–0.2)

## 2021-06-07 LAB — PROTIME-INR
INR: 1.8 — ABNORMAL HIGH (ref 0.8–1.2)
Prothrombin Time: 20.3 seconds — ABNORMAL HIGH (ref 11.4–15.2)

## 2021-06-07 MED ORDER — FUROSEMIDE 40 MG PO TABS: 40.0000 mg | ORAL_TABLET | Freq: Every day | ORAL | 0 refills | Status: DC

## 2021-06-07 MED ORDER — FERROUS SULFATE 325 (65 FE) MG PO TBEC: 325.0000 mg | DELAYED_RELEASE_TABLET | Freq: Every day | ORAL | 3 refills | Status: DC

## 2021-06-07 MED ORDER — GUAIFENESIN ER 600 MG PO TB12: 600.0000 mg | ORAL_TABLET | Freq: Two times a day (BID) | ORAL | 0 refills | Status: DC

## 2021-06-07 MED ORDER — NICOTINE 14 MG/24HR TD PT24: 14.0000 mg | MEDICATED_PATCH | Freq: Every day | TRANSDERMAL | 0 refills | Status: DC | PRN

## 2021-06-07 MED ORDER — TRIAMTERENE-HCTZ 37.5-25 MG PO TABS: 0.5000 | ORAL_TABLET | Freq: Every morning | ORAL | 3 refills | Status: DC

## 2021-06-07 MED ORDER — AZITHROMYCIN 500 MG PO TABS: 500.0000 mg | ORAL_TABLET | Freq: Every day | ORAL | 0 refills | Status: AC

## 2021-06-07 MED ORDER — AMLODIPINE BESYLATE 10 MG PO TABS: 10.0000 mg | ORAL_TABLET | Freq: Every day | ORAL | 3 refills | Status: DC

## 2021-06-07 MED ORDER — ALBUTEROL SULFATE HFA 108 (90 BASE) MCG/ACT IN AERS: 2.0000 | INHALATION_SPRAY | RESPIRATORY_TRACT | 0 refills | Status: DC | PRN

## 2021-06-07 MED ORDER — WARFARIN SODIUM 5 MG PO TABS: 10.0000 mg | ORAL_TABLET | Freq: Once | ORAL | Status: DC

## 2021-06-07 MED ORDER — CEFDINIR 300 MG PO CAPS: 300.0000 mg | ORAL_CAPSULE | Freq: Two times a day (BID) | ORAL | 0 refills | Status: DC

## 2021-06-07 NOTE — Progress Notes (Signed)
ANTICOAGULATION CONSULT NOTE -   Pharmacy Consult for warfarin Indication: history of PE  Allergies  Allergen Reactions   Sulfa Antibiotics Itching, Swelling and Rash    Lips swelling    Patient Measurements: Height: '5\' 10"'$  (177.8 cm) Weight: 127.4 kg (280 lb 14.4 oz) IBW/kg (Calculated) : 68.5  Vital Signs: Temp: 97.4 F (36.3 C) (05/18 0550) Temp Source: Axillary (05/18 0550) BP: 113/70 (05/18 0550) Pulse Rate: 94 (05/18 0550)  Labs: Recent Labs    06/04/21 2321 06/05/21 0750 06/05/21 1827 06/06/21 0416 06/07/21 0346  HGB 6.9* 7.2*  7.2* 8.8* 7.9* 8.0*  HCT 25.8* 26.3*  26.3* 31.6* 28.7* 30.2*  PLT 281 258  --  226 233  LABPROT 22.7*  --   --  20.6* 20.3*  INR 2.0*  --   --  1.8* 1.8*  CREATININE 0.73 0.83  --  0.75 0.84  TROPONINIHS 13  --   --   --   --      Estimated Creatinine Clearance: 124.3 mL/min (by C-G formula based on SCr of 0.84 mg/dL).   Medical History: Past Medical History:  Diagnosis Date   Abdominal cramps 08/30/2015   Anemia    Blood transfusion without reported diagnosis    Closed left ankle fracture    August 30 2012   Clotting disorder [facility])    DVT (deep venous thrombosis) ([facility])    L leg   Fibroid tumor    Hidradenitis    High blood pressure    Hypertension    Obesity    OSA on CPAP    Oxygen deficiency    Pregnancy    09/20/14 17 weeks   Pulmonary embolism ([place]) 04/2013   Sleep apnea    Tobacco use disorder     Medications:  Medications Prior to Admission  Medication Sig Dispense Refill Last Dose   amLODipine (NORVASC) 10 MG tablet Take 1 tablet (10 mg total) by mouth daily. 90 tablet 3 06/04/2021   triamterene-hydrochlorothiazide (MAXZIDE-25) 37.5-25 MG tablet TAKE 1 TABLET BY MOUTH ONCE A DAY. 30 tablet 3 06/04/2021   warfarin (COUMADIN) 10 MG tablet TAKE (1) OR (1) 1/2 TABLET BY MOUTH DAILY AS DIRECTED. 45 tablet 1 06/04/2021 at 0900    Assessment: Pharmacy consulted to dose warfarin in patient with history of  pulmonary embolism.  Patient's home dose listed as 15 mg on Tuesday and Saturday and 10 mg ROW.  INR 1.8  Goal of Therapy:  INR 2-3 Monitor platelets by anticoagulation protocol: Yes   Plan:  Warfarin 10 mg x 1 dose. Monitor daily INR and s/s of bleeding.  Isac Sarna, BS Pharm D, BCPS Clinical Pharmacist 06/07/2021 12:37 PM

## 2021-06-07 NOTE — Discharge Instructions (Signed)
1) please restart the triamterene/hydrochlorothiazide at  Half a tablet every Morning starting 06/11/21 after you finish Lasix/Furosemide 2) you will take Lasix/furosemide 40 mg daily starting on telemetry today 06/07/2021 through Monday, 06/11/2021 3)Avoid ibuprofen/Advil/Aleve/Motrin/Goody Powders/Naproxen/BC powders/Meloxicam/Diclofenac/Indomethacin and other Nonsteroidal anti-inflammatory medications as these will make you more likely to bleed and can cause stomach ulcers, can also cause Kidney problems.  4) complete abstinence from tobacco advised 5) please recheck PT/INR and CBC test around Monday, 06/11/2021 6)Please follow-up with OB/GYN Dr. Florian Buff, MD at Address: [address] [place], [place] [address] Phone: [phone]--to discuss possible treatment for your heavy menstrual flow which is causing your anemia

## 2021-06-07 NOTE — Discharge Summary (Signed)
[name] [name], is a 46 y.o. female  DOB 1975/08/07  MRN [id number].  Admission date:  06/04/2021  Admitting Physician  Christel Mormon, MD  Discharge Date:  06/07/2021   Primary MD  Carrolyn Meiers, MD  Recommendations for primary care physician for things to follow:  1) please restart the triamterene/hydrochlorothiazide at  Half a tablet every Morning starting 06/11/21 after you finish Lasix/Furosemide 2) you will take Lasix/furosemide 40 mg daily starting on telemetry today 06/07/2021 through Monday, 06/11/2021 3)Avoid ibuprofen/Advil/Aleve/Motrin/Goody Powders/Naproxen/BC powders/Meloxicam/Diclofenac/Indomethacin and other Nonsteroidal anti-inflammatory medications as these will make you more likely to bleed and can cause stomach ulcers, can also cause Kidney problems.  4) complete abstinence from tobacco advised 5) please recheck PT/INR and CBC test around Monday, 06/11/2021 6)Please follow-up with OB/GYN Dr. Florian Buff, MD at Address: [address], [place], [place] [address] Phone: [phone]--to discuss possible treatment for your heavy menstrual flow which is causing your anemia  Admission Diagnosis  CAP (community acquired pneumonia) [J18.9] Community acquired pneumonia, unspecified laterality [J18.9] Acute anemia [D64.9]   Discharge Diagnosis  CAP (community acquired pneumonia) [J18.9] Community acquired pneumonia, unspecified laterality [J18.9] Acute anemia [D64.9]    Principal Problem:   CAP (community acquired pneumonia) Active Problems:   Acute on chronic diastolic CHF (congestive heart failure) (HCC)   Symptomatic anemia   OSA on CPAP   Tobacco use disorder   Severe Iron deficiency anemia   History of pulmonary embolism   Menorrhagia   Essential hypertension   Hypokalemia   Acute anemia      Past Medical History:  Diagnosis Date   Abdominal cramps 08/30/2015   Anemia     Blood transfusion without reported diagnosis    Closed left ankle fracture    August 30 2012   Clotting disorder [facility])    DVT (deep venous thrombosis) (HCC)    L leg   Fibroid tumor    Hidradenitis    High blood pressure    Hypertension    Obesity    OSA on CPAP    Oxygen deficiency    Pregnancy    09/20/14 17 weeks   Pulmonary embolism ([place]) 04/2013   Sleep apnea    Tobacco use disorder     Past Surgical History:  Procedure Laterality Date   ABSCESS DRAINAGE Bilateral 01/10/15   COLONOSCOPY WITH PROPOFOL N/A 06/08/2018   Procedure: COLONOSCOPY WITH PROPOFOL;  Surgeon: Daneil Dolin, MD;  [facility];  Service: Endoscopy;  Laterality: N/A;  2:30pm   CYST REMOVAL TRUNK     IVC FILTER PLACEMENT ([place] [facility])     LOWER EXTREMITY VENOGRAPHY N/A 02/07/2021   Procedure: LOWER EXTREMITY VENOGRAPHY;  Surgeon: [name], MD;  Location: [facility];  Service: Cardiovascular;  Laterality: N/A;   PERIPHERAL VASCULAR THROMBECTOMY N/A 02/07/2021   Procedure: PERIPHERAL VASCULAR THROMBECTOMY;  Surgeon: [name], MD;  Location: [facility];  Service: Cardiovascular;  Laterality: N/A;     HPI  from the  history and physical done on the day of admission:     [name] [name] is a 46 y.o. African-American female with medical history significant for hypertension, DVT and PE, on anticoagulation with Coumadin, OSA on CPAP and tobacco abuse, who presented to the ER with acute onset of worsening dyspnea with associated cough productive of thick white sputum since Thursday as well as occasional chest pain with cough.  She admits to orthopnea and paroxysmal nocturnal dyspnea as well as dyspnea on exertion.  No worsening lower extremity edema.  She has been having cold chills but did not check her temperature and does not believe she had fever.  No nausea or vomiting or abdominal pain.  No dysuria, acute diarrhea or hematuria or flank pain.   ED Course: She came to the ER,  respiratory rate was 21 with otherwise normal vital signs.  Labs revealed a BNP of 537 and high-sensitivity troponin of 13.  CMP was remarkable for borderline potassium of 3.5 and sodium 133 with albumin of 3.1.  CBC showed hemoglobin of 6.9 hematocrit 25.8 with low RBC indices .H&H were 9.2 and 34.7 on 01/30/2021. EKG as reviewed by me : EKG showed sinus rhythm with rate of 93 with PACs, left anterior fascicular block and low voltage QRS. Imaging: Two-view chest x-ray showed vascular congestion with diffuse interstitial opacities, patchy left lower lobe airspace disease and diffuse interstitial thickening that may be secondary to pulmonary edema or atypical infection.  Chest CTA revealed no evidence for PE.  It showed groundglass opacities in the mid and lower lungs left greater than right that could reflect edema or pneumonia/pneumonitis.  The patient was given IV Rocephin and Zithromax.  She was typed and crossmatch and started on transfusion with 1 unit of packed red blood cells.  She will be admitted to a stepdown unit bed for further evaluation and management.     Hospital Course:     46 y.o. African-American female with medical history significant for hypertension, DVT and PE, on anticoagulation with Coumadin, OSA on CPAP and tobacco abuse, who presented to the ER with acute onset of worsening dyspnea with associated cough productive of thick white sputum since Thursday as well as occasional chest pain with cough.  She admits to orthopnea and paroxysmal nocturnal dyspnea as well as dyspnea on exertion.  No worsening lower extremity edema.  She has been having cold chills but did not check her temperature and does not believe she had fever.  No nausea or vomiting or abdominal pain.  No dysuria, acute diarrhea or hematuria or flank pain.   ED Course: She came to the ER, respiratory rate was 21 with otherwise normal vital signs.  Labs revealed a BNP of 537 and high-sensitivity troponin of 13.  CMP  was remarkable for borderline potassium of 3.5 and sodium 133 with albumin of 3.1.  CBC showed hemoglobin of 6.9 hematocrit 25.8 with low RBC indices .H&H were 9.2 and 34.7 on 01/30/2021. EKG as reviewed by me : EKG showed sinus rhythm with rate of 93 with PACs, left anterior fascicular block and low voltage QRS. Imaging: Two-view chest x-ray showed vascular congestion with diffuse interstitial opacities, patchy left lower lobe airspace disease and diffuse interstitial thickening that may be secondary to pulmonary edema or atypical infection.  Chest CTA revealed no evidence for PE.  It showed groundglass opacities in the mid and lower lungs left greater than right that could reflect edema or pneumonia/pneumonitis.  The patient was given IV Rocephin and  Zithromax.  She was typed and crossmatch and started on transfusion with 1 unit of packed red blood cells.  She will be admitted to a stepdown unit bed for further evaluation and management.  06/05/2021: Hg only up to 7.2.  Transfuse an additional 2 units of PRBC today.  Continue IV diuresis with lasix.    Assessment and Plan:  CAP (community acquired bacterial pneumonia) - Respiratory status improved significantly -Treated with IV Rocephin and Zithromax, mucolytics and bronchodilators - -MRSA screen negative COVID and flu negative -Discharged on azithromycin and Omnicef   Acute on chronic symptomatic anemia due to menorrhagia - This is likely contributing to her acute heart failure. - Patient received 3 units of PRBC  -Dyspnea on exertion resolved with transfusion and diuresis - -Outpatient follow-up with gynecologist advised   HFmREF-Acute on chronic systolic dysfunction CHF /PAH -  treated with IV Lasix - Anemia and pneumonia contributing . -Echo with EF of 45 to 50% LAD ventricle with global hypokinesis and ventricular dilatation and hypertrophy -Patient appears to have severe pulmonary hypertension -Outpatient follow-up with cardiologist  recommended - --Dyspnea on exertion resolved with transfusion and diuresis   OSA on CPAP - continue her CPAP nightly.   Hypokalemia - Replace and recheck especially while on Lasix   Essential hypertension - Home BP meds on hold due to soft BP and to give room for diuresis   Menorrhagia--in the setting of uterine leiomyoma - causing severe iron deficiency and anemia -Transfused 3 units of PRBC -Outpatient follow-up with GYN emphasized   History of pulmonary embolism - Continue Coumadin therapy - she reports she has been on warfarin since 2015 and plan was for lifetime anticoagulation   Severe Iron deficiency anemia due to menorrhagia - transfused 3 units PRBC total -Continue iron supplementation - address menorrhagia with gynecology follow up    Tobacco use disorder - Smoking cessation advised -OTC nicotine patch recommended     DVT prophylaxis: therapeutic warfarin  Code Status: full  Family Communication: discussed plan with patient and her husband  disposition: Home   Procedures:  PRBC transfusion  Antimicrobials:  Azithromycin 5/16>> Ceftriaxone 5/16>>   Discharge Condition: Stable without hypoxia  Follow UP   Follow-up Information     Florian Buff, MD. Schedule an appointment as soon as possible for a visit on 06/11/2021.   Specialties: Obstetrics and Gynecology, Radiology Why: Menorrhagia with anemia Contact information: [place] [phone]                 Diet and Activity recommendation:  As advised  Discharge Instructions     Discharge Instructions     Call MD for:  difficulty breathing, headache or visual disturbances   Complete by: As directed    Call MD for:  persistant dizziness or light-headedness   Complete by: As directed    Call MD for:  persistant nausea and vomiting   Complete by: As directed    Call MD for:  temperature >100.4   Complete by: As directed    Diet - low sodium heart  healthy   Complete by: As directed    Discharge instructions   Complete by: As directed    1) please restart the triamterene/hydrochlorothiazide at  Half a tablet every Morning starting 06/11/21 after you finish Lasix/Furosemide 2) you will take Lasix/furosemide 40 mg daily starting on telemetry today 06/07/2021 through Monday, 06/11/2021 3)Avoid ibuprofen/Advil/Aleve/Motrin/Goody Powders/Naproxen/BC powders/Meloxicam/Diclofenac/Indomethacin and other Nonsteroidal anti-inflammatory medications as these will make  you more likely to bleed and can cause stomach ulcers, can also cause Kidney problems.  4) complete abstinence from tobacco advised 5) please recheck PT/INR and CBC test around Monday, 06/11/2021 6)Please follow-up with OB/GYN Dr. Florian Buff, MD at Address: [address], [place], [place] [address] Phone: [phone]--to discuss possible treatment for your heavy menstrual flow which is causing your anemia   Increase activity slowly   Complete by: As directed          Discharge Medications     Allergies as of 06/07/2021       Reactions   Sulfa Antibiotics Itching, Swelling, Rash   Lips swelling        Medication List     TAKE these medications    albuterol 108 (90 Base) MCG/ACT inhaler Commonly known as: VENTOLIN HFA Inhale 2 puffs into the lungs every 2 (two) hours as needed for wheezing or shortness of breath.   amLODipine 10 MG tablet Commonly known as: NORVASC Take 1 tablet (10 mg total) by mouth daily.   azithromycin 500 MG tablet Commonly known as: ZITHROMAX Take 1 tablet (500 mg total) by mouth daily for 2 days. Start taking on: Jun 08, 2021   cefdinir 300 MG capsule Commonly known as: OMNICEF Take 1 capsule (300 mg total) by mouth 2 (two) times daily for 5 days. Start taking on: Jun 08, 2021   ferrous sulfate 325 (65 FE) MG EC tablet Take 1 tablet (325 mg total) by mouth daily with breakfast.   furosemide 40 MG tablet Commonly known as: Lasix Take  1 tablet (40 mg total) by mouth daily for 4 days.   guaiFENesin 600 MG 12 hr tablet Commonly known as: Mucinex Take 1 tablet (600 mg total) by mouth 2 (two) times daily for 10 days.   nicotine 14 mg/24hr patch Commonly known as: NICODERM CQ - dosed in mg/24 hours Place 1 patch (14 mg total) onto the skin daily as needed (nicotine craving).   triamterene-hydrochlorothiazide 37.5-25 MG tablet Commonly known as: MAXZIDE-25 Take 0.5 tablets by mouth every morning. Take Half a tablet every Morning starting 06/11/21 after you finish Lasix/Furosemide What changed:  how much to take when to take this additional instructions   warfarin 10 MG tablet Commonly known as: COUMADIN Take as directed. If you are unsure how to take this medication, talk to your nurse or doctor. Original instructions: TAKE (1) OR (1) 1/2 TABLET BY MOUTH DAILY AS DIRECTED.        Major procedures and Radiology Reports - PLEASE review detailed and final reports for all details, in brief -    DG Chest 2 View  Result Date: 06/07/2021 CLINICAL DATA:  Cough EXAM: CHEST - 2 VIEW COMPARISON:  06/04/2021 FINDINGS: Stable heart size. Mild pulmonary vascular congestion with bilateral perihilar and bibasilar interstitial opacities. Slightly improving aeration within the left lung base compared to prior. No pleural effusion or pneumothorax. IMPRESSION: Persistent bilateral interstitial opacities with slightly improving aeration within the left lung base compared to prior. Electronically Signed   By: Davina Poke D.O.   On: 06/07/2021 08:24   DG Chest 2 View  Result Date: 06/04/2021 CLINICAL DATA:  Shortness of breath for 3 days with productive cough. Intermittent chest pain and lethargy. EXAM: CHEST - 2 VIEW COMPARISON:  Radiographs 04/27/2013. Chest CT 10/21/2013. Abdominal CT 01/30/2021. FINDINGS: The heart size and mediastinal contours are normal. There is vascular congestion with new interstitial opacities in both lungs,  diffuse fissural thickening and  patchy left lower lobe airspace disease. No pneumothorax or significant pleural effusion. The bones appear unchanged. There are degenerative changes throughout the thoracic spine. IMPRESSION: Vascular congestion with diffuse interstitial opacities, patchy left lower lobe airspace disease and diffuse interstitial thickening. Findings may be secondary to pulmonary edema or atypical infection. Electronically Signed   By: Richardean Sale M.D.   On: 06/04/2021 14:10   CT Angio Chest PE W and/or Wo Contrast  Result Date: 06/05/2021 CLINICAL DATA:  Pulmonary embolism (PE) suspected, high prob. Productive cough, shortness of breath, occasional chest pain EXAM: CT ANGIOGRAPHY CHEST WITH CONTRAST TECHNIQUE: Multidetector CT imaging of the chest was performed using the standard protocol during bolus administration of intravenous contrast. Multiplanar CT image reconstructions and MIPs were obtained to evaluate the vascular anatomy. RADIATION DOSE REDUCTION: This exam was performed according to the departmental dose-optimization program which includes automated exposure control, adjustment of the mA and/or kV according to patient size and/or use of iterative reconstruction technique. CONTRAST:  123m OMNIPAQUE IOHEXOL 350 MG/ML SOLN COMPARISON:  10/21/2013 FINDINGS: Cardiovascular: Heart is normal size. Aorta is normal caliber. No filling defects in the pulmonary arteries to suggest pulmonary emboli. Mediastinum/Nodes: No mediastinal, hilar, or axillary adenopathy. Trachea and esophagus are unremarkable. Thyroid unremarkable. Lungs/Pleura: Ground-glass opacities in the mid and lower lungs. This could reflect edema or atypical infection/pneumonitis. No effusions. Upper Abdomen: No acute findings Musculoskeletal: Chest wall soft tissues are unremarkable. No acute bony abnormality. Review of the MIP images confirms the above findings. IMPRESSION: No evidence of pulmonary embolus. Ground-glass  opacities in the mid and lower lungs, left greater than right. This could reflect edema or pneumonia/pneumonitis. Electronically Signed   By: KRolm BaptiseM.D.   On: 06/05/2021 01:15   ECHOCARDIOGRAM COMPLETE  Result Date: 06/05/2021    ECHOCARDIOGRAM REPORT   Patient Name:   [name] POLITTEDate of Exam: 06/05/2021 Medical Rec #:  [id number]      Height:       70.0 in Accession #:    [id number]     Weight:       282.4 lb Date of Birth:  [birth year]      BSA:          2.417 m Patient Age:    43years        BP:           115/65 mmHg Patient Gender: F               HR:           84 bpm. Exam [facility]  [facility]cedure: 2D Echo, Cardiac Doppler, Color Doppler and Intracardiac            Opacification Agent Indications:    CHF  History:        Patient has prior history of Echocardiogram examinations, most                 recent 09/03/2017. CHF, Pulmonary HTN, Signs/Symptoms:Shortness                 of Breath; Risk Factors:Hypertension and Current Smoker. Saddle                 embolus of Pulmonary artery, S/P IVC filter.  Sonographer:    DWenda LowReferring Phys: [id number] A MHiddenite 1. Left ventricular ejection fraction, by estimation, is 45 to 50%. The left ventricle has mildly decreased function. The left ventricle demonstrates global hypokinesis. The  left ventricular internal cavity size was moderately dilated. There is mild left ventricular hypertrophy. Left ventricular diastolic parameters are indeterminate.  2. Right ventricular systolic function is normal. The right ventricular size is normal. There is severely elevated pulmonary artery systolic pressure.  3. Left atrial size was severely dilated.  4. Right atrial size was mildly dilated.  5. The mitral valve is abnormal. Mild mitral valve regurgitation. No evidence of mitral stenosis.  6. The tricuspid valve is abnormal.  7. The aortic valve is tricuspid. Aortic valve regurgitation is not visualized. No aortic stenosis is present.   8. The inferior vena cava is normal in size with greater than 50% respiratory variability, suggesting right atrial pressure of 3 mmHg. FINDINGS  Left Ventricle: Left ventricular ejection fraction, by estimation, is 45 to 50%. The left ventricle has mildly decreased function. The left ventricle demonstrates global hypokinesis. Definity contrast agent was given IV to delineate the left ventricular  endocardial borders. The left ventricular internal cavity size was moderately dilated. There is mild left ventricular hypertrophy. Left ventricular diastolic parameters are indeterminate. Right Ventricle: The right ventricular size is normal. Right vetricular wall thickness was not well visualized. Right ventricular systolic function is normal. There is severely elevated pulmonary artery systolic pressure. The tricuspid regurgitant velocity is 3.64 m/s, and with an assumed right atrial pressure of 8 mmHg, the estimated right ventricular systolic pressure is 29.9 mmHg. Left Atrium: Left atrial size was severely dilated. Right Atrium: Right atrial size was mildly dilated. Pericardium: There is no evidence of pericardial effusion. Mitral Valve: The mitral valve is abnormal. Mild mitral valve regurgitation. No evidence of mitral valve stenosis. MV peak gradient, 10.0 mmHg. The mean mitral valve gradient is 3.0 mmHg. Tricuspid Valve: The tricuspid valve is abnormal. Tricuspid valve regurgitation is mild . No evidence of tricuspid stenosis. Aortic Valve: The aortic valve is tricuspid. Aortic valve regurgitation is not visualized. No aortic stenosis is present. Aortic valve mean gradient measures 4.0 mmHg. Aortic valve peak gradient measures 7.4 mmHg. Aortic valve area, by VTI measures 3.22 cm. Pulmonic Valve: The pulmonic valve was not well visualized. Pulmonic valve regurgitation is not visualized. No evidence of pulmonic stenosis. Aorta: The aortic root is normal in size and structure. Venous: The inferior vena cava is normal  in size with greater than 50% respiratory variability, suggesting right atrial pressure of 3 mmHg. IAS/Shunts: No atrial level shunt detected by color flow Doppler.  LEFT VENTRICLE PLAX 2D LVIDd:         6.60 cm      Diastology LVIDs:         5.00 cm      LV e' medial:    9.03 cm/s LV PW:         1.30 cm      LV E/e' medial:  14.4 LV IVS:        1.20 cm      LV e' lateral:   9.36 cm/s LVOT diam:     2.20 cm      LV E/e' lateral: 13.9 LV SV:         86 LV SV Index:   36 LVOT Area:     3.80 cm  LV Volumes (MOD) LV vol d, MOD A4C: 167.0 ml LV vol s, MOD A4C: 72.6 ml LV SV MOD A4C:     167.0 ml RIGHT VENTRICLE RV Basal diam:  4.45 cm RV S prime:     11.40 cm/s LEFT ATRIUM  Index        RIGHT ATRIUM           Index LA diam:        5.60 cm  2.32 cm/m   RA Area:     22.20 cm LA Vol (A2C):   127.0 ml 52.54 ml/m  RA Volume:   72.00 ml  29.79 ml/m LA Vol (A4C):   136.0 ml 56.27 ml/m LA Biplane Vol: 132.0 ml 54.61 ml/m  AORTIC VALVE                    PULMONIC VALVE AV Area (Vmax):    3.19 cm     PV Vmax:       0.63 m/s AV Area (Vmean):   3.11 cm     PV Peak grad:  1.6 mmHg AV Area (VTI):     3.22 cm AV Vmax:           136.00 cm/s AV Vmean:          89.800 cm/s AV VTI:            0.268 m AV Peak Grad:      7.4 mmHg AV Mean Grad:      4.0 mmHg LVOT Vmax:         114.00 cm/s LVOT Vmean:        73.500 cm/s LVOT VTI:          0.227 m LVOT/AV VTI ratio: 0.85  AORTA Ao Root diam: 2.90 cm MITRAL VALVE                TRICUSPID VALVE MV Area (PHT): 3.83 cm     TR Peak grad:   53.0 mmHg MV Area VTI:   2.47 cm     TR Vmax:        364.00 cm/s MV Peak grad:  10.0 mmHg MV Mean grad:  3.0 mmHg     SHUNTS MV Vmax:       1.58 m/s     Systemic VTI:  0.23 m MV Vmean:      78.9 cm/s    Systemic Diam: 2.20 cm MV Decel Time: 198 msec MV E velocity: 130.00 cm/s MV A velocity: 60.40 cm/s MV E/A ratio:  2.15 Carlyle Dolly MD Electronically signed by Carlyle Dolly MD Signature Date/Time: 06/05/2021/12:14:52 PM    Final      Micro Results   Recent Results (from the past 240 hour(s))  Resp Panel by RT-PCR (Flu A&B, Covid) Nasopharyngeal Swab     Status: None   Collection Time: 06/05/21  1:25 AM   Specimen: Nasopharyngeal Swab; Nasopharyngeal(NP) swabs in vial transport medium  Result Value Ref Range Status   SARS Coronavirus 2 by RT PCR NEGATIVE NEGATIVE Final    Comment: (NOTE) SARS-CoV-2 target nucleic acids are NOT DETECTED.  The SARS-CoV-2 RNA is generally detectable in upper respiratory specimens during the acute phase of infection. The lowest concentration of SARS-CoV-2 viral copies this assay can detect is 138 copies/mL. A negative result does not preclude SARS-Cov-2 infection and should not be used as the sole basis for treatment or other patient management decisions. A negative result may occur with  improper specimen collection/handling, submission of specimen other than nasopharyngeal swab, presence of viral mutation(s) within the areas targeted by this assay, and inadequate number of viral copies(<138 copies/mL). A negative result must be combined with clinical observations, patient history, and epidemiological information. The expected result is Negative.  Fact Sheet for  Patients:  EntrepreneurPulse.com.au  Fact Sheet for Healthcare Providers:  IncredibleEmployment.be  This test is no t yet approved or cleared by the [place] FDA and  has been authorized for detection and/or diagnosis of SARS-CoV-2 by FDA under an Emergency Use Authorization (EUA). This EUA will remain  in effect (meaning this test can be used) for the duration of the COVID-19 declaration under Section 564(b)(1) of the Act, 21 U.S.C.section 360bbb-3(b)(1), unless the authorization is terminated  or revoked sooner.       Influenza A by PCR NEGATIVE NEGATIVE Final   Influenza B by PCR NEGATIVE NEGATIVE Final    Comment: (NOTE) The Xpert Xpress SARS-CoV-2/FLU/RSV plus assay  is intended as an aid in the diagnosis of influenza from Nasopharyngeal swab specimens and should not be used as a sole basis for treatment. Nasal washings and aspirates are unacceptable for Xpert Xpress SARS-CoV-2/FLU/RSV testing.  Fact Sheet for Patients: EntrepreneurPulse.com.au  Fact Sheet for Healthcare Providers: IncredibleEmployment.be  This test is not yet approved or cleared by the [place] FDA and has been authorized for detection and/or diagnosis of SARS-CoV-2 by FDA under an Emergency Use Authorization (EUA). This EUA will remain in effect (meaning this test can be used) for the duration of the COVID-19 declaration under Section 564(b)(1) of the Act, 21 U.S.C. section 360bbb-3(b)(1), unless the authorization is terminated or revoked.  Performed at [facility], [address], [place], [place] [address]   MRSA Next Gen by PCR, Nasal     Status: None   Collection Time: 06/05/21  2:24 AM   Specimen: Nasal Mucosa; Nasal Swab  Result Value Ref Range Status   MRSA by PCR Next Gen NOT DETECTED NOT DETECTED Final    Comment: (NOTE) The GeneXpert MRSA Assay (FDA approved for NASAL specimens only), is one component of a comprehensive MRSA colonization surveillance program. It is not intended to diagnose MRSA infection nor to guide or monitor treatment for MRSA infections. Test performance is not FDA approved in patients less than 47 years old. Performed at [facility], [address], [place], [place] [address]     Today   Subjective    [name] [name] today has no new complaints  No fever  Or chills   No Nausea, Vomiting or Diarrhea -Dyspnea on exertion resolved with transfusion and diuresis           Patient has been seen and examined prior to discharge   Objective   Blood pressure 113/70, pulse 94, temperature (!) 97.4 F (36.3 C), temperature source Axillary, resp. rate (!) 22, height '5\' 10"'$  (1.778 m), weight 127.4 kg,  last menstrual period 05/09/2021, SpO2 95 %.   Intake/Output Summary (Last 24 hours) at 06/07/2021 1242 Last data filed at 06/07/2021 1217 Gross per 24 hour  Intake 1550 ml  Output --  Net 1550 ml    Exam Gen:- Awake Alert, no acute distress , speaking in complete sentences HEENT:- [place].AT, No sclera icterus Neck-Supple Neck,No JVD,.  Lungs-  CTAB , good air movement bilaterally CV- S1, S2 normal, regular Abd-  +ve B.Sounds, Abd Soft, No tenderness,    Extremity/Skin:- No  edema,   good pulses Psych-affect is appropriate, oriented x3 Neuro-no new focal deficits, no tremors   Data Review   CBC w Diff:  Lab Results  Component Value Date   WBC 9.6 06/07/2021   HGB 8.0 (L) 06/07/2021   HGB 12.5 01/18/2015   HCT 30.2 (L) 06/07/2021   HCT 37.7 01/18/2015   PLT  233 06/07/2021   PLT 214 01/18/2015   LYMPHOPCT 34 01/30/2021   MONOPCT 8 01/30/2021   EOSPCT 3 01/30/2021   BASOPCT 1 01/30/2021    CMP:  Lab Results  Component Value Date   NA 136 06/07/2021   NA 139 01/18/2015   K 3.9 06/07/2021   CL 102 06/07/2021   CO2 25 06/07/2021   BUN 19 06/07/2021   BUN 9 01/18/2015   CREATININE 0.84 06/07/2021   CREATININE 0.74 01/31/2017   PROT 7.4 06/04/2021   PROT 7.1 01/18/2015   ALBUMIN 3.1 (L) 06/04/2021   ALBUMIN 3.7 01/18/2015   BILITOT 0.5 06/04/2021   BILITOT <0.2 01/18/2015   ALKPHOS 43 06/04/2021   AST 15 06/04/2021   ALT 15 06/04/2021  .  Total Discharge time is about 33 minutes  Roxan Hockey M.D on 06/07/2021 at 12:42 PM  Go to [email or web address] -  for contact info  [facility] - Office  [phone]

## 2021-06-11 DIAGNOSIS — R0689 Other abnormalities of breathing: Secondary | ICD-10-CM | POA: Diagnosis not present

## 2021-06-11 DIAGNOSIS — R0602 Shortness of breath: Secondary | ICD-10-CM | POA: Diagnosis not present

## 2021-06-11 DIAGNOSIS — Z4682 Encounter for fitting and adjustment of non-vascular catheter: Secondary | ICD-10-CM | POA: Diagnosis not present

## 2021-06-11 DIAGNOSIS — Z9989 Dependence on other enabling machines and devices: Secondary | ICD-10-CM | POA: Diagnosis not present

## 2021-06-11 DIAGNOSIS — I501 Left ventricular failure: Secondary | ICD-10-CM | POA: Diagnosis not present

## 2021-06-11 DIAGNOSIS — J189 Pneumonia, unspecified organism: Secondary | ICD-10-CM | POA: Diagnosis not present

## 2021-06-11 DIAGNOSIS — Z79899 Other long term (current) drug therapy: Secondary | ICD-10-CM | POA: Diagnosis not present

## 2021-06-11 DIAGNOSIS — R0989 Other specified symptoms and signs involving the circulatory and respiratory systems: Secondary | ICD-10-CM | POA: Diagnosis not present

## 2021-06-11 DIAGNOSIS — I509 Heart failure, unspecified: Secondary | ICD-10-CM | POA: Diagnosis not present

## 2021-06-11 DIAGNOSIS — Z20822 Contact with and (suspected) exposure to covid-19: Secondary | ICD-10-CM | POA: Diagnosis not present

## 2021-06-11 DIAGNOSIS — Z7901 Long term (current) use of anticoagulants: Secondary | ICD-10-CM | POA: Diagnosis not present

## 2021-06-11 DIAGNOSIS — J9601 Acute respiratory failure with hypoxia: Secondary | ICD-10-CM | POA: Diagnosis not present

## 2021-06-11 DIAGNOSIS — I5020 Unspecified systolic (congestive) heart failure: Secondary | ICD-10-CM | POA: Diagnosis not present

## 2021-06-11 DIAGNOSIS — R7989 Other specified abnormal findings of blood chemistry: Secondary | ICD-10-CM | POA: Diagnosis not present

## 2021-06-11 DIAGNOSIS — G4733 Obstructive sleep apnea (adult) (pediatric): Secondary | ICD-10-CM | POA: Diagnosis not present

## 2021-06-11 DIAGNOSIS — R059 Cough, unspecified: Secondary | ICD-10-CM | POA: Diagnosis not present

## 2021-06-11 DIAGNOSIS — Z8701 Personal history of pneumonia (recurrent): Secondary | ICD-10-CM | POA: Diagnosis not present

## 2021-06-11 DIAGNOSIS — I491 Atrial premature depolarization: Secondary | ICD-10-CM | POA: Diagnosis not present

## 2021-06-11 DIAGNOSIS — R9431 Abnormal electrocardiogram [ECG] [EKG]: Secondary | ICD-10-CM | POA: Diagnosis not present

## 2021-06-11 DIAGNOSIS — R0902 Hypoxemia: Secondary | ICD-10-CM | POA: Diagnosis not present

## 2021-06-11 DIAGNOSIS — D649 Anemia, unspecified: Secondary | ICD-10-CM | POA: Diagnosis not present

## 2021-06-12 ENCOUNTER — Inpatient Hospital Stay (HOSPITAL_COMMUNITY)
Admission: AD | Admit: 2021-06-12 | Discharge: 2021-06-22 | DRG: 208 | Disposition: A | Discharge status: Home / Self Care | Payer: Medicare HMO | Source: Other Acute Inpatient Hospital | Attending: Internal Medicine | Admitting: Internal Medicine

## 2021-06-12 ENCOUNTER — Inpatient Hospital Stay (HOSPITAL_COMMUNITY): Payer: Medicare HMO

## 2021-06-12 DIAGNOSIS — I2510 Atherosclerotic heart disease of native coronary artery without angina pectoris: Secondary | ICD-10-CM | POA: Diagnosis present

## 2021-06-12 DIAGNOSIS — F17210 Nicotine dependence, cigarettes, uncomplicated: Secondary | ICD-10-CM | POA: Diagnosis present

## 2021-06-12 DIAGNOSIS — I5033 Acute on chronic diastolic (congestive) heart failure: Secondary | ICD-10-CM | POA: Diagnosis present

## 2021-06-12 DIAGNOSIS — G894 Chronic pain syndrome: Secondary | ICD-10-CM | POA: Diagnosis not present

## 2021-06-12 DIAGNOSIS — D649 Anemia, unspecified: Secondary | ICD-10-CM | POA: Diagnosis not present

## 2021-06-12 DIAGNOSIS — Z6839 Body mass index (BMI) 39.0-39.9, adult: Secondary | ICD-10-CM

## 2021-06-12 DIAGNOSIS — Z86718 Personal history of other venous thrombosis and embolism: Secondary | ICD-10-CM | POA: Diagnosis not present

## 2021-06-12 DIAGNOSIS — I5043 Acute on chronic combined systolic (congestive) and diastolic (congestive) heart failure: Secondary | ICD-10-CM | POA: Diagnosis present

## 2021-06-12 DIAGNOSIS — J9690 Respiratory failure, unspecified, unspecified whether with hypoxia or hypercapnia: Secondary | ICD-10-CM | POA: Diagnosis not present

## 2021-06-12 DIAGNOSIS — Z9989 Dependence on other enabling machines and devices: Secondary | ICD-10-CM | POA: Diagnosis not present

## 2021-06-12 DIAGNOSIS — Z7901 Long term (current) use of anticoagulants: Secondary | ICD-10-CM | POA: Diagnosis not present

## 2021-06-12 DIAGNOSIS — I5023 Acute on chronic systolic (congestive) heart failure: Secondary | ICD-10-CM | POA: Diagnosis not present

## 2021-06-12 DIAGNOSIS — D509 Iron deficiency anemia, unspecified: Secondary | ICD-10-CM | POA: Diagnosis not present

## 2021-06-12 DIAGNOSIS — G4733 Obstructive sleep apnea (adult) (pediatric): Secondary | ICD-10-CM | POA: Diagnosis not present

## 2021-06-12 DIAGNOSIS — J811 Chronic pulmonary edema: Secondary | ICD-10-CM | POA: Diagnosis not present

## 2021-06-12 DIAGNOSIS — R9431 Abnormal electrocardiogram [ECG] [EKG]: Secondary | ICD-10-CM | POA: Diagnosis not present

## 2021-06-12 DIAGNOSIS — Y95 Nosocomial condition: Secondary | ICD-10-CM | POA: Diagnosis present

## 2021-06-12 DIAGNOSIS — J189 Pneumonia, unspecified organism: Secondary | ICD-10-CM | POA: Diagnosis not present

## 2021-06-12 DIAGNOSIS — J810 Acute pulmonary edema: Secondary | ICD-10-CM | POA: Diagnosis not present

## 2021-06-12 DIAGNOSIS — Z4682 Encounter for fitting and adjustment of non-vascular catheter: Secondary | ICD-10-CM | POA: Diagnosis not present

## 2021-06-12 DIAGNOSIS — R1310 Dysphagia, unspecified: Secondary | ICD-10-CM

## 2021-06-12 DIAGNOSIS — Z20822 Contact with and (suspected) exposure to covid-19: Secondary | ICD-10-CM | POA: Diagnosis present

## 2021-06-12 DIAGNOSIS — E66813 Obesity, class 3: Secondary | ICD-10-CM | POA: Diagnosis present

## 2021-06-12 DIAGNOSIS — J9601 Acute respiratory failure with hypoxia: Secondary | ICD-10-CM | POA: Diagnosis not present

## 2021-06-12 DIAGNOSIS — F17200 Nicotine dependence, unspecified, uncomplicated: Secondary | ICD-10-CM | POA: Diagnosis present

## 2021-06-12 DIAGNOSIS — Z8701 Personal history of pneumonia (recurrent): Secondary | ICD-10-CM | POA: Diagnosis not present

## 2021-06-12 DIAGNOSIS — R0602 Shortness of breath: Secondary | ICD-10-CM | POA: Diagnosis not present

## 2021-06-12 DIAGNOSIS — J849 Interstitial pulmonary disease, unspecified: Secondary | ICD-10-CM | POA: Diagnosis not present

## 2021-06-12 DIAGNOSIS — I2722 Pulmonary hypertension due to left heart disease: Secondary | ICD-10-CM | POA: Diagnosis not present

## 2021-06-12 DIAGNOSIS — I5020 Unspecified systolic (congestive) heart failure: Secondary | ICD-10-CM | POA: Diagnosis not present

## 2021-06-12 DIAGNOSIS — Z8249 Family history of ischemic heart disease and other diseases of the circulatory system: Secondary | ICD-10-CM | POA: Diagnosis not present

## 2021-06-12 DIAGNOSIS — J90 Pleural effusion, not elsewhere classified: Secondary | ICD-10-CM | POA: Diagnosis not present

## 2021-06-12 DIAGNOSIS — Z86711 Personal history of pulmonary embolism: Secondary | ICD-10-CM | POA: Diagnosis present

## 2021-06-12 DIAGNOSIS — Z79899 Other long term (current) drug therapy: Secondary | ICD-10-CM | POA: Diagnosis not present

## 2021-06-12 DIAGNOSIS — I10 Essential (primary) hypertension: Secondary | ICD-10-CM | POA: Diagnosis not present

## 2021-06-12 DIAGNOSIS — R7989 Other specified abnormal findings of blood chemistry: Secondary | ICD-10-CM | POA: Diagnosis not present

## 2021-06-12 DIAGNOSIS — I110 Hypertensive heart disease with heart failure: Secondary | ICD-10-CM | POA: Diagnosis not present

## 2021-06-12 DIAGNOSIS — J690 Pneumonitis due to inhalation of food and vomit: Secondary | ICD-10-CM | POA: Diagnosis not present

## 2021-06-12 DIAGNOSIS — R69 Illness, unspecified: Secondary | ICD-10-CM | POA: Diagnosis not present

## 2021-06-12 LAB — CBC
HCT: 32.4 % — ABNORMAL LOW (ref 36.0–46.0)
Hemoglobin: 8.9 g/dL — ABNORMAL LOW (ref 12.0–15.0)
MCH: 19.8 pg — ABNORMAL LOW (ref 26.0–34.0)
MCHC: 27.5 g/dL — ABNORMAL LOW (ref 30.0–36.0)
MCV: 72 fL — ABNORMAL LOW (ref 80.0–100.0)
Platelets: 261 10*3/uL (ref 150–400)
RBC: 4.5 MIL/uL (ref 3.87–5.11)
RDW: 26.4 % — ABNORMAL HIGH (ref 11.5–15.5)
WBC: 9.2 10*3/uL (ref 4.0–10.5)
nRBC: 0 % (ref 0.0–0.2)

## 2021-06-12 LAB — PROTIME-INR
INR: 3.3 — ABNORMAL HIGH (ref 0.8–1.2)
Prothrombin Time: 33.2 seconds — ABNORMAL HIGH (ref 11.4–15.2)

## 2021-06-12 LAB — MAGNESIUM: Magnesium: 2.2 mg/dL (ref 1.7–2.4)

## 2021-06-12 LAB — BLOOD GAS, ARTERIAL
Acid-Base Excess: 4.2 mmol/L — ABNORMAL HIGH (ref 0.0–2.0)
Bicarbonate: 29.2 mmol/L — ABNORMAL HIGH (ref 20.0–28.0)
Drawn by: 560031
FIO2: 80 %
MECHVT: 410 mL
O2 Saturation: 99.5 %
PEEP: 5 cmH2O
Patient temperature: 36.6
RATE: 24 resp/min
pCO2 arterial: 43 mmHg (ref 32–48)
pH, Arterial: 7.44 (ref 7.35–7.45)
pO2, Arterial: 93 mmHg (ref 83–108)

## 2021-06-12 LAB — COMPREHENSIVE METABOLIC PANEL
ALT: 17 U/L (ref 0–44)
AST: 22 U/L (ref 15–41)
Albumin: 3 g/dL — ABNORMAL LOW (ref 3.5–5.0)
Alkaline Phosphatase: 38 U/L (ref 38–126)
Anion gap: 8 (ref 5–15)
BUN: 23 mg/dL — ABNORMAL HIGH (ref 6–20)
CO2: 23 mmol/L (ref 22–32)
Calcium: 8.8 mg/dL — ABNORMAL LOW (ref 8.9–10.3)
Chloride: 104 mmol/L (ref 98–111)
Creatinine, Ser: 0.95 mg/dL (ref 0.44–1.00)
GFR, Estimated: >60 mL/min (ref >60)
Glucose, Bld: 82 mg/dL (ref 70–99)
Potassium: 4.1 mmol/L (ref 3.5–5.1)
Sodium: 135 mmol/L (ref 135–145)
Total Bilirubin: 0.4 mg/dL (ref 0.3–1.2)
Total Protein: 7.2 g/dL (ref 6.5–8.1)

## 2021-06-12 LAB — RESPIRATORY PANEL BY PCR

## 2021-06-12 LAB — APTT: aPTT: 37 seconds — ABNORMAL HIGH (ref 24–36)

## 2021-06-12 LAB — MRSA NEXT GEN BY PCR, NASAL: MRSA by PCR Next Gen: NOT DETECTED

## 2021-06-12 LAB — PHOSPHORUS: Phosphorus: 3.7 mg/dL (ref 2.5–4.6)

## 2021-06-12 LAB — GLUCOSE, CAPILLARY
Glucose-Capillary: 74 mg/dL (ref 70–99)
Glucose-Capillary: 88 mg/dL (ref 70–99)
Glucose-Capillary: 89 mg/dL (ref 70–99)

## 2021-06-12 LAB — TROPONIN I (HIGH SENSITIVITY): Troponin I (High Sensitivity): 8 ng/L (ref <18)

## 2021-06-12 LAB — PROCALCITONIN: Procalcitonin: 0.19 ng/mL

## 2021-06-12 LAB — BRAIN NATRIURETIC PEPTIDE: B Natriuretic Peptide: 293.6 pg/mL — ABNORMAL HIGH (ref 0.0–100.0)

## 2021-06-12 LAB — LACTIC ACID, PLASMA: Lactic Acid, Venous: 1.7 mmol/L (ref 0.5–1.9)

## 2021-06-12 MED ORDER — FENTANYL CITRATE PF 50 MCG/ML IJ SOSY: 50.0000 ug | PREFILLED_SYRINGE | INTRAMUSCULAR | Status: DC | PRN

## 2021-06-12 MED ORDER — SODIUM CHLORIDE 0.9 % IV SOLN
2.0000 g | Freq: Three times a day (TID) | INTRAVENOUS | Status: DC
  Administered 2021-06-12 – 2021-06-14 (×5): 2 g via INTRAVENOUS
  Filled 2021-06-12 (×5): qty 12.5

## 2021-06-12 MED ORDER — POLYETHYLENE GLYCOL 3350 17 G PO PACK
17.0000 g | PACK | Freq: Every day | ORAL | Status: DC
  Administered 2021-06-12 – 2021-06-13 (×2): 17 g
  Filled 2021-06-12 (×2): qty 1

## 2021-06-12 MED ORDER — ARFORMOTEROL TARTRATE 15 MCG/2ML IN NEBU
15.0000 ug | INHALATION_SOLUTION | Freq: Two times a day (BID) | RESPIRATORY_TRACT | Status: DC
  Administered 2021-06-12 – 2021-06-14 (×4): 15 ug via RESPIRATORY_TRACT
  Filled 2021-06-12 (×4): qty 2

## 2021-06-12 MED ORDER — WARFARIN - PHARMACIST DOSING INPATIENT: Freq: Every day | Status: DC

## 2021-06-12 MED ORDER — FENTANYL BOLUS VIA INFUSION
50.0000 ug | INTRAVENOUS | Status: DC | PRN
  Administered 2021-06-13: 50 ug via INTRAVENOUS
  Administered 2021-06-13 (×2): 100 ug via INTRAVENOUS
  Administered 2021-06-13: 50 ug via INTRAVENOUS
  Administered 2021-06-13: 100 ug via INTRAVENOUS
  Administered 2021-06-14 (×3): 50 ug via INTRAVENOUS

## 2021-06-12 MED ORDER — FENTANYL CITRATE PF 50 MCG/ML IJ SOSY
50.0000 ug | PREFILLED_SYRINGE | Freq: Once | INTRAMUSCULAR | Status: AC
  Administered 2021-06-12: 50 ug via INTRAVENOUS

## 2021-06-12 MED ORDER — DOCUSATE SODIUM 50 MG/5ML PO LIQD
100.0000 mg | Freq: Two times a day (BID) | ORAL | Status: DC
  Administered 2021-06-12 – 2021-06-13 (×4): 100 mg
  Filled 2021-06-12 (×4): qty 10

## 2021-06-12 MED ORDER — SODIUM CHLORIDE 0.9 % IV SOLN
500.0000 mg | INTRAVENOUS | Status: AC
  Administered 2021-06-12 – 2021-06-16 (×5): 500 mg via INTRAVENOUS
  Filled 2021-06-12 (×5): qty 5

## 2021-06-12 MED ORDER — PROSOURCE TF PO LIQD
45.0000 mL | Freq: Two times a day (BID) | ORAL | Status: DC
  Administered 2021-06-12 – 2021-06-13 (×2): 45 mL
  Filled 2021-06-12 (×2): qty 45

## 2021-06-12 MED ORDER — INSULIN ASPART 100 UNIT/ML IJ SOLN: 0.0000 [IU] | INTRAMUSCULAR | Status: DC

## 2021-06-12 MED ORDER — CHLORHEXIDINE GLUCONATE 0.12% ORAL RINSE (MEDLINE KIT)
15.0000 mL | Freq: Two times a day (BID) | OROMUCOSAL | Status: DC
  Administered 2021-06-12 – 2021-06-14 (×4): 15 mL via OROMUCOSAL

## 2021-06-12 MED ORDER — ORAL CARE MOUTH RINSE
15.0000 mL | OROMUCOSAL | Status: DC
  Administered 2021-06-12 – 2021-06-14 (×17): 15 mL via OROMUCOSAL

## 2021-06-12 MED ORDER — PANTOPRAZOLE 2 MG/ML SUSPENSION
40.0000 mg | Freq: Every day | ORAL | Status: DC
  Administered 2021-06-12 – 2021-06-13 (×2): 40 mg
  Filled 2021-06-12 (×2): qty 20

## 2021-06-12 MED ORDER — CHLORHEXIDINE GLUCONATE CLOTH 2 % EX PADS
6.0000 | MEDICATED_PAD | Freq: Every day | CUTANEOUS | Status: DC
  Administered 2021-06-12 – 2021-06-22 (×11): 6 via TOPICAL

## 2021-06-12 MED ORDER — VITAL HIGH PROTEIN PO LIQD
1000.0000 mL | ORAL | Status: DC
  Administered 2021-06-12: 1000 mL

## 2021-06-12 MED ORDER — REVEFENACIN 175 MCG/3ML IN SOLN
175.0000 ug | Freq: Every day | RESPIRATORY_TRACT | Status: DC
  Administered 2021-06-13 – 2021-06-14 (×2): 175 ug via RESPIRATORY_TRACT
  Filled 2021-06-12 (×3): qty 3

## 2021-06-12 MED ORDER — PANTOPRAZOLE SODIUM 40 MG PO TBEC
40.0000 mg | DELAYED_RELEASE_TABLET | Freq: Every day | ORAL | Status: DC
Start: 2021-06-12 — End: 2021-06-12

## 2021-06-12 MED ORDER — FUROSEMIDE 10 MG/ML IJ SOLN
40.0000 mg | Freq: Once | INTRAMUSCULAR | Status: AC
  Administered 2021-06-12: 40 mg via INTRAVENOUS
  Filled 2021-06-12: qty 4

## 2021-06-12 MED ORDER — PROPOFOL 1000 MG/100ML IV EMUL
0.0000 ug/kg/min | INTRAVENOUS | Status: DC
  Administered 2021-06-12: 5 ug/kg/min via INTRAVENOUS
  Administered 2021-06-12 – 2021-06-14 (×4): 10 ug/kg/min via INTRAVENOUS
  Filled 2021-06-12 (×5): qty 100

## 2021-06-12 MED ORDER — FENTANYL 2500MCG IN NS 250ML (10MCG/ML) PREMIX INFUSION
50.0000 ug/h | INTRAVENOUS | Status: DC
  Administered 2021-06-12 – 2021-06-13 (×2): 100 ug/h via INTRAVENOUS
  Filled 2021-06-12 (×2): qty 250

## 2021-06-12 MED ORDER — WARFARIN SODIUM 5 MG PO TABS
15.0000 mg | ORAL_TABLET | Freq: Once | ORAL | Status: DC
  Filled 2021-06-12: qty 3

## 2021-06-12 MED ORDER — BUDESONIDE 0.5 MG/2ML IN SUSP
0.5000 mg | Freq: Two times a day (BID) | RESPIRATORY_TRACT | Status: DC
  Administered 2021-06-12 – 2021-06-14 (×4): 0.5 mg via RESPIRATORY_TRACT
  Filled 2021-06-12 (×4): qty 2

## 2021-06-12 NOTE — Progress Notes (Signed)
Pharmacy Antibiotic Note  [name] [name] is a 46 y.o. female admitted on 06/12/2021 with pulmonary edema vs. viral/atypical PNA .  Pharmacy has been consulted for Cefepime dosing.  Plan: Cefepime 2gm IV q8h No dose adjustments anticipated.  Pharmacy will sign off and monitor peripherally via electronic surveillance software for any changes in renal function or micro data.      No data recorded.  Recent Labs  Lab 06/06/21 0416 06/07/21 0346 06/12/21 1409  WBC 7.2 9.6 9.2  CREATININE 0.75 0.84 0.95  LATICACIDVEN  --   --  1.7    Estimated Creatinine Clearance: 109.9 mL/min (by C-G formula based on SCr of 0.95 mg/dL).    Allergies  Allergen Reactions   Sulfa Antibiotics Itching, Swelling and Rash    Lips swelling    Antimicrobials this admission: 5/23 Zithromax >>  5/23 Cefepime >>   Dose adjustments this admission:  Microbiology results: 5/23 Resp PCR: 5/23 MRSA PCR:   Thank you for allowing pharmacy to be a part of this patient's care.  Netta Cedars PharmD 06/12/2021 2:51 PM

## 2021-06-12 NOTE — Progress Notes (Signed)
eLink Physician-Brief Progress Note Patient Name: [name] [name] DOB: January 23, 1975 MRN: [id number]   Date of Service  06/12/2021  HPI/Events of Note  Patient had foley from [place] on 06/12/21.  eICU Interventions  Continue foley     Intervention Category Minor Interventions: Routine modifications to care plan (e.g. PRN medications for pain, fever)  [name] [name] 06/12/2021, 9:28 PM

## 2021-06-12 NOTE — H&P (Addendum)
NAME:  [name] [name], MRN:  [id number], DOB:  11/03/1975, LOS: 0 ADMISSION DATE:  06/12/2021, CONSULTATION DATE:  06/12/21 REFERRING MD:  Benson Norway, CHIEF COMPLAINT:  acute respiratory failure   History of Present Illness:  [name] [name] is a 46 y.o. F with PMH of CHF, OSA on CPAP, history of DVT/PE on coumadin, HTN, iron deficiency anemia who presented to [facility] [place] with worsening shortness of breath.  CXR was significant for pulmonary edema vs pneumonia and pt required intubation.  BNP 2800, troponin 260 leukocytosis, she was started on Ceftriaxone and Azithromycin. Covid-19 was negative, she required fentanyl and versed for sedation with persistent agitation so paralytic was ordered and she was transferred to [facility].    Of note, she was recently admitted from 5/16-5/18 with CHF flare and treated with rocephin, azithromycin, IV lasix and required transfusion for menorrhagia.   Pertinent  Medical History   has a past medical history of Abdominal cramps (08/30/2015), Anemia, Blood transfusion without reported diagnosis, Closed left ankle fracture, Clotting disorder ([place]), DVT (deep venous thrombosis) ([place]), Fibroid tumor, Hidradenitis, High blood pressure, Hypertension, Obesity, OSA on CPAP, Oxygen deficiency, Pregnancy, Pulmonary embolism ([place]) (04/2013), Sleep apnea, and Tobacco use disorder.   Significant Hospital Events: Including procedures, antibiotic start and stop dates in addition to other pertinent events   5/23 Transferred from [facility] [place] to [place], intubated and sedated, stable   Interim History / Subjective:  No issues during transport Arrived hemodynamically stable   Objective   There were no vitals taken for this visit.    Vent Mode: PRVC FiO2 (%):  [60 %] 60 % Set Rate:  [24 bmp] 24 bmp Vt Set:  [410 mL] 410 mL PEEP:  [5 cmH20] 5 cmH20 Plateau Pressure:  [20 cmH20] 20 cmH20  No intake or output data in the 24 hours ending 06/12/21 1320 There were no  vitals filed for this visit.   General: obese F, intubated and sedated   HEENT: MM pink/moist, pupils equal, sclera anicteric  Neuro: RASS -5 examined on sedation and paralytic CV: s1s2 rrr, no m/r/g PULM:  mechanically vented, minimal vent settings, rhonchi bilaterally, synchronous with vent  GI: soft, soft, non-distended Extremities: warm/dry, 1+ edema  Skin: no rashes or lesions   Resolved Hospital Problem list     Assessment & Plan:     Acute Hypoxic Respiratory Failure Most likely secondary to acute CHF exacerbation vs pneumonia  recently discharged after admission for pulmonary edema, echo from one week ago with LVEF 45-50% -repeat CXR with continued edema vs atypical/viral PNA -check RVP, urine strep and legionella -tracheal aspirate culture -awaiting labs to evaluate kidney function, inflammatory markers to determine further diuresis  -start Azithromycin and Cefepime -stop paralytic and start propofol/ fentanyl -Maintain full vent support with SAT/SBT as tolerated -titrate Vent setting to maintain SpO2 greater than or equal to 90%. -HOB elevated 30 degrees. -Plateau pressures less than 30 cm H20.  -Follow chest x-ray, ABG prn.   -Bronchial hygiene and RT/bronchodilator protocol.  History of DVT/PE Saddle PE diagnosed 2015 CT chest without PE last week On coumadin -check PT/ INR -pharmacy consult for assistance with coumadin dosing   Elevated troponin 244 in [place] Suspect secondary to demand -repeat EKG and trend trop  Best Practice (right click and "Reselect all SmartList Selections" daily)   Diet/type: tubefeeds DVT prophylaxis: Coumadin GI prophylaxis: PPI Lines: N/A Foley:  Yes, and it is still needed Code Status:  full code Last date of multidisciplinary goals of  care discussion [pending] will try reach family   Labs   CBC: Recent Labs  Lab 06/05/21 1827 06/06/21 0416 06/07/21 0346  WBC  --  7.2 9.6  HGB 8.8* 7.9* 8.0*  HCT 31.6*  28.7* 30.2*  MCV  --  69.3* 71.2*  PLT  --  226 093    Basic Metabolic Panel: Recent Labs  Lab 06/06/21 0416 06/07/21 0346  NA 136 136  K 3.9 3.9  CL 107 102  CO2 25 25  GLUCOSE 83 85  BUN 16 19  CREATININE 0.75 0.84  CALCIUM 8.1* 8.5*  MG 1.9  --    GFR: Estimated Creatinine Clearance: 124.3 mL/min (by C-G formula based on SCr of 0.84 mg/dL). Recent Labs  Lab 06/06/21 0416 06/07/21 0346  WBC 7.2 9.6    Liver Function Tests: No results for input(s): AST, ALT, ALKPHOS, BILITOT, PROT, ALBUMIN in the last 168 hours. No results for input(s): LIPASE, AMYLASE in the last 168 hours. No results for input(s): AMMONIA in the last 168 hours.  ABG    Component Value Date/Time   TCO2 30 02/07/2021 0930     Coagulation Profile: Recent Labs  Lab 06/06/21 0416 06/07/21 0346  INR 1.8* 1.8*    Cardiac Enzymes: No results for input(s): CKTOTAL, CKMB, CKMBINDEX, TROPONINI in the last 168 hours.  HbA1C: Hgb A1c MFr Bld  Date/Time Value Ref Range Status  01/31/2017 11:19 AM 5.3 <5.7 % of total Hgb Final    Comment:    For the purpose of screening for the presence of diabetes: . <5.7%       Consistent with the absence of diabetes 5.7-6.4%    Consistent with increased risk for diabetes             (prediabetes) > or =6.5%  Consistent with diabetes . This assay result is consistent with a decreased risk of diabetes. . Currently, no consensus exists regarding use of hemoglobin A1c for diagnosis of diabetes in children. . According to American Diabetes Association (ADA) guidelines, hemoglobin A1c <7.0% represents optimal control in non-pregnant diabetic patients. Different metrics may apply to specific patient populations.  Standards of Medical Care in Diabetes(ADA). Marland Kitchen   04/28/2013 04:17 AM 5.0 <5.7 % Final    Comment:    (NOTE)                                                                       According to the ADA Clinical Practice Recommendations for 2011,  when HbA1c is used as a screening test:  >=6.5%   Diagnostic of Diabetes Mellitus           (if abnormal result is confirmed) 5.7-6.4%   Increased risk of developing Diabetes Mellitus References:Diagnosis and Classification of Diabetes Mellitus,Diabetes OIZT,2458,09(XIPJA 1):S62-S69 and Standards of Medical Care in         Diabetes - 2011,Diabetes SNKN,3976,73 (Suppl 1):S11-S61.    CBG: No results for input(s): GLUCAP in the last 168 hours.  Review of Systems:   Unable to obtain  Past Medical History:  She,  has a past medical history of Abdominal cramps (08/30/2015), Anemia, Blood transfusion without reported diagnosis, Closed left ankle fracture, Clotting disorder ([place]), DVT (deep venous thrombosis) ([place]), Fibroid tumor, Hidradenitis, High  blood pressure, Hypertension, Obesity, OSA on CPAP, Oxygen deficiency, Pregnancy, Pulmonary embolism ([place]) (04/2013), Sleep apnea, and Tobacco use disorder.   Surgical History:   Past Surgical History:  Procedure Laterality Date   ABSCESS DRAINAGE Bilateral 01/10/15   COLONOSCOPY WITH PROPOFOL N/A 06/08/2018   Procedure: COLONOSCOPY WITH PROPOFOL;  Surgeon: Daneil Dolin, MD;  Location: [facility];  Service: Endoscopy;  Laterality: N/A;  2:30pm   CYST REMOVAL TRUNK     IVC FILTER PLACEMENT ([place] HX)     LOWER EXTREMITY VENOGRAPHY N/A 02/07/2021   Procedure: LOWER EXTREMITY VENOGRAPHY;  Surgeon: [name], MD;  Location: [place] [facility] LAB;  Service: Cardiovascular;  Laterality: N/A;   PERIPHERAL VASCULAR THROMBECTOMY N/A 02/07/2021   Procedure: PERIPHERAL VASCULAR THROMBECTOMY;  Surgeon: [name], MD;  Location: [place] CV LAB;  Service: Cardiovascular;  Laterality: N/A;     Social History:   reports that she has been smoking cigarettes. She has a 4.50 pack-year smoking history. She has never used smokeless tobacco. She reports current alcohol use of about 2.0 standard drinks per week. She reports that she does not use drugs.    Family History:  Her family history includes Cancer in her maternal grandmother; Diabetes in her paternal aunt and paternal uncle; Heart attack in her paternal grandmother; Hypertension in her mother; Other in her father. There is no history of Colon cancer.   Allergies Allergies  Allergen Reactions   Sulfa Antibiotics Itching, Swelling and Rash    Lips swelling     Home Medications  Prior to Admission medications   Medication Sig Start Date End Date Taking? Authorizing Provider  albuterol (VENTOLIN HFA) 108 (90 Base) MCG/ACT inhaler Inhale 2 puffs into the lungs every 2 (two) hours as needed for wheezing or shortness of breath. 06/07/21   Roxan Hockey, MD  amLODipine (NORVASC) 10 MG tablet Take 1 tablet (10 mg total) by mouth daily. 06/07/21   Roxan Hockey, MD  cefdinir (OMNICEF) 300 MG capsule Take 1 capsule (300 mg total) by mouth 2 (two) times daily for 5 days. 06/08/21 06/13/21  Roxan Hockey, MD  ferrous sulfate 325 (65 FE) MG EC tablet Take 1 tablet (325 mg total) by mouth daily with breakfast. 06/07/21   Roxan Hockey, MD  furosemide (LASIX) 40 MG tablet Take 1 tablet (40 mg total) by mouth daily for 4 days. 06/07/21 06/11/21  Roxan Hockey, MD  guaiFENesin (MUCINEX) 600 MG 12 hr tablet Take 1 tablet (600 mg total) by mouth 2 (two) times daily for 10 days. 06/07/21 06/17/21  Roxan Hockey, MD  nicotine (NICODERM CQ - DOSED IN MG/24 HOURS) 14 mg/24hr patch Place 1 patch (14 mg total) onto the skin daily as needed (nicotine craving). 06/07/21   Roxan Hockey, MD  triamterene-hydrochlorothiazide (MAXZIDE-25) 37.5-25 MG tablet Take 0.5 tablets by mouth every morning. Take Half a tablet every Morning starting 06/11/21 after you finish Lasix/Furosemide 06/07/21   Roxan Hockey, MD  warfarin (COUMADIN) 10 MG tablet TAKE (1) OR (1) 1/2 TABLET BY MOUTH DAILY AS DIRECTED. 04/23/21   Arnoldo Lenis, MD     Critical care time:  45 minutes      CRITICAL CARE Performed by:  Otilio Carpen Davionne Dowty   Total critical care time: 45 minutes  Critical care time was exclusive of separately billable procedures and treating other patients.  Critical care was necessary to treat or prevent imminent or life-threatening deterioration.  Critical care was time spent personally by me on the following  activities: development of treatment plan with patient and/or surrogate as well as nursing, discussions with consultants, evaluation of patient's response to treatment, examination of patient, obtaining history from patient or surrogate, ordering and performing treatments and interventions, ordering and review of laboratory studies, ordering and review of radiographic studies, pulse oximetry and re-evaluation of patient's condition.   Otilio Carpen Elesha Thedford, PA-C [facility] See Amion for pager If no response to pager , please call [phone] until 7pm After 7:00 pm call [place]  [phone]?[phone]

## 2021-06-12 NOTE — Progress Notes (Signed)
ANTICOAGULATION CONSULT NOTE - Initial Consult  Pharmacy Consult for Coumadin Indication:  hx VTE  Allergies  Allergen Reactions   Sulfa Antibiotics Itching, Swelling and Rash    Lips swelling    Patient Measurements:     Vital Signs:    Labs: Recent Labs    06/12/21 1409  HGB 8.9*  HCT 32.4*  PLT 261  CREATININE 0.95  TROPONINIHS 8    Estimated Creatinine Clearance: 109.9 mL/min (by C-G formula based on SCr of 0.95 mg/dL).   Medical History: Past Medical History:  Diagnosis Date   Abdominal cramps 08/30/2015   Anemia    Blood transfusion without reported diagnosis    Closed left ankle fracture    August 30 2012   Clotting disorder [facility])    DVT (deep venous thrombosis) ([facility])    L leg   Fibroid tumor    Hidradenitis    High blood pressure    Hypertension    Obesity    OSA on CPAP    Oxygen deficiency    Pregnancy    09/20/14 17 weeks   Pulmonary embolism ([place]) 04/2013   Sleep apnea    Tobacco use disorder     Medications:  Per Coumadin Clinic notes- she takes '10mg'$  daily except '15mg'$  on Tues and Sat- last dose unknown but >24h ago  Assessment: 46 yo F on warfarin PTA for hx DVT/PE.  INR is supra-therapeutic (3.3) on admission. She is currently intubated on broad-spectrum antibiotics (Cefepime/Zithromax) which can increase INR.  She has chronic anemia and is currently menstruating.  RN notes some red-tinged urine per foley. Hg 8.9 on admission which appears to be around patient's baseline and higher than during last week's admission.  She is currently intubated with tube feeding ordered.   Goal of Therapy:  INR 2-3   Plan:  Hold Coumadin today Daily PT/INR  Netta Cedars PharmD 06/12/2021,3:45 PM

## 2021-06-13 ENCOUNTER — Other Ambulatory Visit: Payer: Self-pay

## 2021-06-13 ENCOUNTER — Encounter (HOSPITAL_COMMUNITY): Payer: Self-pay | Admitting: Pulmonary Disease

## 2021-06-13 ENCOUNTER — Inpatient Hospital Stay (HOSPITAL_COMMUNITY): Payer: Medicare HMO

## 2021-06-13 DIAGNOSIS — I5023 Acute on chronic systolic (congestive) heart failure: Secondary | ICD-10-CM | POA: Diagnosis not present

## 2021-06-13 DIAGNOSIS — I5033 Acute on chronic diastolic (congestive) heart failure: Secondary | ICD-10-CM | POA: Diagnosis not present

## 2021-06-13 DIAGNOSIS — J9601 Acute respiratory failure with hypoxia: Secondary | ICD-10-CM | POA: Diagnosis not present

## 2021-06-13 LAB — COMPREHENSIVE METABOLIC PANEL
ALT: 17 U/L (ref 0–44)
AST: 18 U/L (ref 15–41)
Albumin: 2.8 g/dL — ABNORMAL LOW (ref 3.5–5.0)
Alkaline Phosphatase: 36 U/L — ABNORMAL LOW (ref 38–126)
Anion gap: 6 (ref 5–15)
BUN: 27 mg/dL — ABNORMAL HIGH (ref 6–20)
CO2: 25 mmol/L (ref 22–32)
Calcium: 8.9 mg/dL (ref 8.9–10.3)
Chloride: 107 mmol/L (ref 98–111)
Creatinine, Ser: 0.81 mg/dL (ref 0.44–1.00)
GFR, Estimated: >60 mL/min (ref >60)
Glucose, Bld: 88 mg/dL (ref 70–99)
Potassium: 3.9 mmol/L (ref 3.5–5.1)
Sodium: 138 mmol/L (ref 135–145)
Total Bilirubin: 0.5 mg/dL (ref 0.3–1.2)
Total Protein: 6.7 g/dL (ref 6.5–8.1)

## 2021-06-13 LAB — MAGNESIUM
Magnesium: 2.2 mg/dL (ref 1.7–2.4)
Magnesium: 2.5 mg/dL — ABNORMAL HIGH (ref 1.7–2.4)

## 2021-06-13 LAB — BASIC METABOLIC PANEL
Anion gap: 6 (ref 5–15)
Anion gap: 6 (ref 5–15)
BUN: 31 mg/dL — ABNORMAL HIGH (ref 6–20)
BUN: 32 mg/dL — ABNORMAL HIGH (ref 6–20)
CO2: 27 mmol/L (ref 22–32)
CO2: 29 mmol/L (ref 22–32)
Calcium: 8.8 mg/dL — ABNORMAL LOW (ref 8.9–10.3)
Calcium: 8.9 mg/dL (ref 8.9–10.3)
Chloride: 106 mmol/L (ref 98–111)
Chloride: 105 mmol/L (ref 98–111)
Creatinine, Ser: 0.79 mg/dL (ref 0.44–1.00)
Creatinine, Ser: 0.95 mg/dL (ref 0.44–1.00)
GFR, Estimated: >60 mL/min (ref >60)
GFR, Estimated: >60 mL/min (ref >60)
Glucose, Bld: 83 mg/dL (ref 70–99)
Glucose, Bld: 83 mg/dL (ref 70–99)
Potassium: 4 mmol/L (ref 3.5–5.1)
Potassium: 4.5 mmol/L (ref 3.5–5.1)
Sodium: 139 mmol/L (ref 135–145)
Sodium: 140 mmol/L (ref 135–145)

## 2021-06-13 LAB — CBC WITH DIFFERENTIAL/PLATELET
Abs Immature Granulocytes: 0.01 10*3/uL (ref 0.00–0.07)
Basophils Absolute: 0.1 10*3/uL (ref 0.0–0.1)
Basophils Relative: 1 %
Eosinophils Absolute: 0.1 10*3/uL (ref 0.0–0.5)
Eosinophils Relative: 1 %
HCT: 28.5 % — ABNORMAL LOW (ref 36.0–46.0)
Hemoglobin: 7.9 g/dL — ABNORMAL LOW (ref 12.0–15.0)
Immature Granulocytes: 0 %
Lymphocytes Relative: 19 %
Lymphs Abs: 1.4 10*3/uL (ref 0.7–4.0)
MCH: 19.8 pg — ABNORMAL LOW (ref 26.0–34.0)
MCHC: 27.7 g/dL — ABNORMAL LOW (ref 30.0–36.0)
MCV: 71.6 fL — ABNORMAL LOW (ref 80.0–100.0)
Monocytes Absolute: 0.7 10*3/uL (ref 0.1–1.0)
Monocytes Relative: 9 %
Neutro Abs: 5.2 10*3/uL (ref 1.7–7.7)
Neutrophils Relative %: 70 %
Platelets: 264 10*3/uL (ref 150–400)
RBC: 3.98 MIL/uL (ref 3.87–5.11)
RDW: 26.3 % — ABNORMAL HIGH (ref 11.5–15.5)
WBC: 7.4 10*3/uL (ref 4.0–10.5)
nRBC: 0 % (ref 0.0–0.2)

## 2021-06-13 LAB — GLUCOSE, CAPILLARY
Glucose-Capillary: 100 mg/dL — ABNORMAL HIGH (ref 70–99)
Glucose-Capillary: 109 mg/dL — ABNORMAL HIGH (ref 70–99)
Glucose-Capillary: 84 mg/dL (ref 70–99)
Glucose-Capillary: 88 mg/dL (ref 70–99)
Glucose-Capillary: 90 mg/dL (ref 70–99)
Glucose-Capillary: 94 mg/dL (ref 70–99)

## 2021-06-13 LAB — PHOSPHORUS
Phosphorus: 3.8 mg/dL (ref 2.5–4.6)
Phosphorus: 4.4 mg/dL (ref 2.5–4.6)

## 2021-06-13 LAB — PROTIME-INR
INR: 3.2 — ABNORMAL HIGH (ref 0.8–1.2)
Prothrombin Time: 32.3 seconds — ABNORMAL HIGH (ref 11.4–15.2)

## 2021-06-13 LAB — STREP PNEUMONIAE URINARY ANTIGEN: Strep Pneumo Urinary Antigen: NEGATIVE

## 2021-06-13 LAB — TRIGLYCERIDES: Triglycerides: 128 mg/dL (ref <150)

## 2021-06-13 MED ORDER — MAGNESIUM SULFATE 2 GM/50ML IV SOLN
2.0000 g | Freq: Once | INTRAVENOUS | Status: AC
  Administered 2021-06-13: 2 g via INTRAVENOUS
  Filled 2021-06-13: qty 50

## 2021-06-13 MED ORDER — WARFARIN SODIUM 2 MG PO TABS
2.0000 mg | ORAL_TABLET | Freq: Once | ORAL | Status: AC
  Administered 2021-06-13: 2 mg
  Filled 2021-06-13: qty 1

## 2021-06-13 MED ORDER — FUROSEMIDE 10 MG/ML IJ SOLN
80.0000 mg | Freq: Two times a day (BID) | INTRAMUSCULAR | Status: AC
  Administered 2021-06-13 (×2): 80 mg via INTRAVENOUS
  Filled 2021-06-13 (×2): qty 8

## 2021-06-13 MED ORDER — VITAL HIGH PROTEIN PO LIQD
1000.0000 mL | ORAL | Status: DC
  Administered 2021-06-13: 1000 mL

## 2021-06-13 MED ORDER — FUROSEMIDE 10 MG/ML IJ SOLN: 40.0000 mg | Freq: Once | INTRAMUSCULAR | Status: DC

## 2021-06-13 MED ORDER — POTASSIUM CHLORIDE 20 MEQ PO PACK
40.0000 meq | PACK | Freq: Once | ORAL | Status: AC
  Administered 2021-06-13: 40 meq
  Filled 2021-06-13: qty 2

## 2021-06-13 NOTE — Progress Notes (Signed)
Initial Nutrition Assessment  DOCUMENTATION CODES:   Morbid obesity  INTERVENTION:  - will adjust TF regimen: Vital High Protein @ 60 ml/hr.  - this regimen + kcal from current propofol rate will provide 1641 kcal, 126 grams protein, and 1204 ml free water.   NUTRITION DIAGNOSIS:   Inadequate oral intake related to inability to eat as evidenced by NPO status.  GOAL:   Provide needs based on ASPEN/SCCM guidelines  MONITOR:   Vent status, TF tolerance, Weight trends, Labs  REASON FOR ASSESSMENT:   Ventilator, Consult Enteral/tube feeding initiation and management  ASSESSMENT:   46 y.o. female with medical history of CHF, OSA on CPAP, history of DVT/PE on coumadin, HTN, and iron deficiency anemia. She presented to [facility] ED due to worsening shortness of breath. CXR showed pulmonary edema vs PNA and she ultimately required intubation. She was then transferred to [facility] ICU.  Patient discussed in rounds this AM. She remains intubated and OGT in place (below L hemidiaphragm on CXR on 06/12/21).  She is receiving TF per protocol: Vital High Protein @ 40 ml/hr with 45 ml Prosource TF BID and 20 ml free water every 4 hours. This regimen is providing 1040 kcal, 106 grams protein, and 922 ml free water.   No visitors present at the time of RD visit. She has not been seen by a [place] RD at any time in the past.   Weight today is 292 lb and weight has been mainly stable since 02/02/21.  Patient admitted with severe iron deficiency anemia and notes indicate no evidence of active bleeding/source of bleeding. She desaturated with attempted wean earlier this AM.     Patient is currently intubated on ventilator support MV: 9.7 L/min Temp (24hrs), Avg:98.3 F (36.8 C), Min:97.4 F (36.3 C), Max:99.5 F (37.5 C) Propofol: 7.6 ml/hr (201 kcal/24 hrs) BP: 122/63 and MAP: 81  Labs reviewed; CBGs: 100, 90, 88 mg/dl, BUN: 31 mg/dl.  Medications reviewed; 100 mg colace  BID, 80 mg IV lasix BID, sliding scale novolog, 2 g IV Mg sulfate x1 run 5/24, 40 mg protonix per OGT/day, 17 g miralax/day, 40 mEq Klor-Con x1 dose 5/24.  Drips; fentanyl @ 100 mcg/hr, propofol @ 10 mcg/kg/min.    NUTRITION - FOCUSED PHYSICAL EXAM:  Flowsheet Row Most Recent Value  Orbital Region Unable to assess  [ETT holder]  Upper Arm Region No depletion  Thoracic and Lumbar Region No depletion  Buccal Region Unable to assess  [ETT holder]  Temple Region No depletion  Clavicle Bone Region No depletion  Clavicle and Acromion Bone Region No depletion  Scapular Bone Region No depletion  Dorsal Hand No depletion  Patellar Region No depletion  Anterior Thigh Region No depletion  Posterior Calf Region No depletion  Edema (RD Assessment) Mild  [BLE]  Hair Reviewed  Eyes Reviewed  Mouth Unable to assess  Skin Reviewed  Nails Unable to assess  [nail polish]       Diet Order:   Diet Order     None       EDUCATION NEEDS:   No education needs have been identified at this time  Skin:  Skin Assessment: Reviewed RN Assessment  Last BM:  PTA/unknown  Height:   Ht Readings from Last 1 Encounters:  06/13/21 '5\' 10"'$  (1.778 m)    Weight:   Wt Readings from Last 1 Encounters:  06/13/21 132.4 kg     BMI:  Body mass index is 41.88 kg/m.  Estimated Nutritional  Needs:  Kcal:  1456-1856 kcal Protein:  >/= 128 grams Fluid:  >/= 1.8 L/day     Jarome Matin, MS, RD, LDN Registered Dietitian II Inpatient Clinical Nutrition RD pager # and on-call/weekend pager # available in [facility]

## 2021-06-13 NOTE — Progress Notes (Signed)
RT tried to wean the Pt on 5/5 40% and the pt's O2 decreased to 77%.

## 2021-06-13 NOTE — Progress Notes (Signed)
NAME:  [name] [name], MRN:  [id number], DOB:  12/27/1975, LOS: 1 ADMISSION DATE:  06/12/2021, CONSULTATION DATE:  06/13/21 REFERRING MD:  Benson Norway, CHIEF COMPLAINT:  acute respiratory failure   History of Present Illness:  [name] [name] is a 46 y.o. F with PMH of CHF, OSA on CPAP, history of DVT/PE on coumadin, HTN, iron deficiency anemia who presented to [facility] [place] with worsening shortness of breath.  CXR was significant for pulmonary edema vs pneumonia and pt required intubation.  BNP 2800, troponin 260 leukocytosis, she was started on Ceftriaxone and Azithromycin. Covid-19 was negative, she required fentanyl and versed for sedation with persistent agitation so paralytic was ordered and she was transferred to [facility].    Of note, she was recently admitted from 5/16-5/18 with CHF flare and treated with rocephin, azithromycin, IV lasix and required transfusion for menorrhagia.   Pertinent  Medical History   has a past medical history of Abdominal cramps (08/30/2015), Anemia, Blood transfusion without reported diagnosis, Closed left ankle fracture, Clotting disorder ([place]), DVT (deep venous thrombosis) ([place]), Fibroid tumor, Hidradenitis, High blood pressure, Hypertension, Obesity, OSA on CPAP, Oxygen deficiency, Pregnancy, Pulmonary embolism ([place]) (04/2013), Sleep apnea, and Tobacco use disorder.   Significant Hospital Events: Including procedures, antibiotic start and stop dates in addition to other pertinent events   5/23 Transferred from [facility] [place] to [facility], intubated and sedated, stable. Working dx acute HF w/ pulm edema and PNA. Azithromycin and Cefepime started. Cultures sent. RVP negative, Ustrep neg.  5/24 failed spontaneous breathing trial with desaturation to 60s required titration up of FiO2 overnight, required lavage and suction twice for desaturation Interim History / Subjective:  Desaturated last night. Required lavage/bagged and FIO2 increased. Max temp  99.5 Desaturated during PSV trial  Objective   Blood pressure (Abnormal) 101/55, pulse 61, temperature 97.9 F (36.6 C), temperature source Axillary, resp. rate (Abnormal) 24, height '5\' 10"'$  (1.778 m), weight 132.4 kg, SpO2 97 %.    Vent Mode: PRVC FiO2 (%):  [30 %-80 %] 50 % Set Rate:  [24 bmp] 24 bmp Vt Set:  [410 mL] 410 mL PEEP:  [5 cmH20] 5 cmH20 Plateau Pressure:  [17 cmH20-20 cmH20] 20 cmH20   Intake/Output Summary (Last 24 hours) at 06/13/2021 0816 Last data filed at 06/13/2021 0600 Gross per 24 hour  Intake 1152.21 ml  Output 1350 ml  Net -197.79 ml   Filed Weights   06/13/21 0500  Weight: 132.4 kg   General this is a 46 year old female patient who is currently on mechanical ventilation she appears in no acute distress sedated on both fentanyl and propofol HEENT normocephalic atraumatic orally intubated sclera nonicteric Pulmonary: Coarse scattered rhonchi equal chest rise currently on 50% FiO2 Plateau pressures at 17 Pcxr from 5/23 w/ ETT good position but showed progression of bilateral R>L airspace disease Cardiac: Regular rhythm with soft systolic murmur Abdomen: Soft not tender no organomegaly Extremities: Warm dry with dependent edema pulses are palpable Neuro: Will open her eyes she will follow commands moving all extremities no focal motor deficits are appreciated GU: Clear yellow urine. Resolved Hospital Problem list   Elevated trop (felt 2/2 demand ischemia->resolved 5/23)  Assessment & Plan:   Principal Problem:   Respiratory failure ([place]) Active Problems:   Tobacco use disorder   OSA on CPAP   Severe Iron deficiency anemia   Morbid obesity ([place])   History of pulmonary embolism   Chronic pain syndrome   Acute on chronic diastolic CHF (congestive heart failure) ([place])  Acute respiratory failure with hypoxia (HCC)   Acute on chronic systolic congestive heart failure (HCC)    Acute Hypoxic Respiratory Failure Most likely secondary to acute CHF  exacerbation vs pneumonia  recently discharged after admission for pulmonary edema, echo from one week ago with LVEF 45-50% Plan Continuing full ventilator support Repeating chest x-ray given episodes of desaturation last night Continue IV Lasix as long as BUN and creatinine will tolerate, continue to aim for negative volume status Follow-up pending sputum culture Day #2 azithromycin and cefepime VAP bundle PAD protocol with RASS goal -1 to -2 Scheduled bronchodilators A.m. chest x-ray I do not think she is ready for extubation given desaturation episode  History of DVT/PE Saddle PE diagnosed 2015 CT chest without PE last week Plan Cont coumadin per pharmacy   H/o OSA Plan Will need CPAP at HS after extubation   Anemia w/out evidence of bleeding Plan Trending CBC Transfusion trigger for hemoglobin less than 7   Best Practice (right click and "Reselect all SmartList Selections" daily)   Diet/type: tubefeeds DVT prophylaxis: Coumadin GI prophylaxis: PPI Lines: N/A Foley:  Yes, and it is still needed Code Status:  full code Last date of multidisciplinary goals of care discussion [pending] will try reach family   Critical care time: 67 min   Erick Colace ACNP-BC [place] Pager # [phone] OR # [phone] if no answer

## 2021-06-13 NOTE — Progress Notes (Addendum)
ANTICOAGULATION CONSULT NOTE - Initial Consult  Pharmacy Consult for Coumadin Indication:  hx VTE  Allergies  Allergen Reactions   Sulfa Antibiotics Itching, Swelling and Rash    Lips swelling    Patient Measurements: Height: '5\' 10"'$  (177.8 cm) (per ECHO) Weight: 132.4 kg (291 lb 14.2 oz) IBW/kg (Calculated) : 68.5   Vital Signs: Temp: 98.2 F (36.8 C) (05/24 0800) Temp Source: Axillary (05/24 0800) BP: 123/80 (05/24 0800) Pulse Rate: 73 (05/24 0800)  Labs: Recent Labs    06/12/21 1409 06/12/21 2011 06/13/21 0249  HGB 8.9*  --  7.9*  HCT 32.4*  --  28.5*  PLT 261  --  264  APTT  --  37*  --   LABPROT  --  33.2* 32.3*  INR  --  3.3* 3.2*  CREATININE 0.95  --  0.81  TROPONINIHS 8  --   --      Estimated Creatinine Clearance: 131.7 mL/min (by C-G formula based on SCr of 0.81 mg/dL).   Medical History: Past Medical History:  Diagnosis Date   Abdominal cramps 08/30/2015   Anemia    Blood transfusion without reported diagnosis    Closed left ankle fracture    August 30 2012   Clotting disorder [facility])    DVT (deep venous thrombosis) (HCC)    L leg   Fibroid tumor    Hidradenitis    High blood pressure    Hypertension    Obesity    OSA on CPAP    Oxygen deficiency    Pregnancy    09/20/14 17 weeks   Pulmonary embolism ([place]) 04/2013   Sleep apnea    Tobacco use disorder     Medications:  Per Coumadin Clinic notes- she takes '10mg'$  daily except '15mg'$  on Tues and Sat- last dose unknown but >24h ago (none charted at [facility])  Assessment: 46 yo F on warfarin PTA for hx DVT/PE.  INR is supra-therapeutic (3.3) on admission.  She is currently on broad-spectrum antibiotics (Cefepime/Zithromax) which can increase INR.  She has chronic anemia and is currently menstruating. Hg 8.9 on admission which appears to be around patient's baseline and higher than during last week's admission. Of note, patient has required transfusion during 5/16 admit due to menorrhagia.   She is currently intubated with tube feeding ordered.   Today, 06/13/21 - INR remains slightly supratherapeutic (3.2) - Hemoglobin down from 8.9 to 7.9 today - Patient is still menstruating, but no other bleeding noted per RN.   Goal of Therapy:  INR 2-3   Plan:  Give reduced dose of warfarin '2mg'$  tonight. Anticipate further INR decrease since dose held 5/23 (and likely 5/22).  Daily PT/INR, CBC Monitor for s/sx bleeding  Dimple Nanas, PharmD 06/13/2021 8:34 AM

## 2021-06-13 NOTE — Progress Notes (Signed)
Called to pt bedside for o2 sats in the 60s.  Pt bagged, lavaged, and suctioned for moderate amount of thick green and black mucus plugs.  Fio2 increased from 40 to 50%, spo2 now 95%.  Rn aware of change in fio2.

## 2021-06-14 ENCOUNTER — Inpatient Hospital Stay (HOSPITAL_COMMUNITY): Payer: Medicare HMO

## 2021-06-14 ENCOUNTER — Telehealth: Payer: Self-pay | Admitting: Acute Care

## 2021-06-14 DIAGNOSIS — J811 Chronic pulmonary edema: Secondary | ICD-10-CM

## 2021-06-14 DIAGNOSIS — I5023 Acute on chronic systolic (congestive) heart failure: Secondary | ICD-10-CM | POA: Diagnosis not present

## 2021-06-14 DIAGNOSIS — J690 Pneumonitis due to inhalation of food and vomit: Secondary | ICD-10-CM

## 2021-06-14 DIAGNOSIS — R1310 Dysphagia, unspecified: Secondary | ICD-10-CM

## 2021-06-14 DIAGNOSIS — J810 Acute pulmonary edema: Secondary | ICD-10-CM | POA: Diagnosis not present

## 2021-06-14 DIAGNOSIS — J9601 Acute respiratory failure with hypoxia: Secondary | ICD-10-CM | POA: Diagnosis not present

## 2021-06-14 LAB — COMPREHENSIVE METABOLIC PANEL
ALT: 23 U/L (ref 0–44)
AST: 26 U/L (ref 15–41)
Albumin: 3.1 g/dL — ABNORMAL LOW (ref 3.5–5.0)
Alkaline Phosphatase: 43 U/L (ref 38–126)
Anion gap: 7 (ref 5–15)
BUN: 25 mg/dL — ABNORMAL HIGH (ref 6–20)
CO2: 32 mmol/L (ref 22–32)
Calcium: 9.2 mg/dL (ref 8.9–10.3)
Chloride: 103 mmol/L (ref 98–111)
Creatinine, Ser: 0.83 mg/dL (ref 0.44–1.00)
GFR, Estimated: >60 mL/min (ref >60)
Glucose, Bld: 100 mg/dL — ABNORMAL HIGH (ref 70–99)
Potassium: 3.8 mmol/L (ref 3.5–5.1)
Sodium: 142 mmol/L (ref 135–145)
Total Bilirubin: 0.7 mg/dL (ref 0.3–1.2)
Total Protein: 7.7 g/dL (ref 6.5–8.1)

## 2021-06-14 LAB — GLUCOSE, CAPILLARY
Glucose-Capillary: 99 mg/dL (ref 70–99)
Glucose-Capillary: 92 mg/dL (ref 70–99)
Glucose-Capillary: 133 mg/dL — ABNORMAL HIGH (ref 70–99)

## 2021-06-14 LAB — LEGIONELLA PNEUMOPHILA SEROGP 1 UR AG: L. pneumophila Serogp 1 Ur Ag: NEGATIVE

## 2021-06-14 LAB — BASIC METABOLIC PANEL
Anion gap: 7 (ref 5–15)
Anion gap: 8 (ref 5–15)
BUN: 31 mg/dL — ABNORMAL HIGH (ref 6–20)
BUN: 27 mg/dL — ABNORMAL HIGH (ref 6–20)
CO2: 30 mmol/L (ref 22–32)
CO2: 28 mmol/L (ref 22–32)
Calcium: 8.6 mg/dL — ABNORMAL LOW (ref 8.9–10.3)
Calcium: 8.9 mg/dL (ref 8.9–10.3)
Chloride: 104 mmol/L (ref 98–111)
Chloride: 104 mmol/L (ref 98–111)
Creatinine, Ser: 0.86 mg/dL (ref 0.44–1.00)
Creatinine, Ser: 0.8 mg/dL (ref 0.44–1.00)
GFR, Estimated: >60 mL/min (ref >60)
GFR, Estimated: >60 mL/min (ref >60)
Glucose, Bld: 79 mg/dL (ref 70–99)
Glucose, Bld: 100 mg/dL — ABNORMAL HIGH (ref 70–99)
Potassium: 3.8 mmol/L (ref 3.5–5.1)
Potassium: 4 mmol/L (ref 3.5–5.1)
Sodium: 141 mmol/L (ref 135–145)
Sodium: 140 mmol/L (ref 135–145)

## 2021-06-14 LAB — CBC
HCT: 31.1 % — ABNORMAL LOW (ref 36.0–46.0)
Hemoglobin: 8.5 g/dL — ABNORMAL LOW (ref 12.0–15.0)
MCH: 19.6 pg — ABNORMAL LOW (ref 26.0–34.0)
MCHC: 27.3 g/dL — ABNORMAL LOW (ref 30.0–36.0)
MCV: 71.7 fL — ABNORMAL LOW (ref 80.0–100.0)
Platelets: 247 10*3/uL (ref 150–400)
RBC: 4.34 MIL/uL (ref 3.87–5.11)
RDW: 26.3 % — ABNORMAL HIGH (ref 11.5–15.5)
WBC: 6.7 10*3/uL (ref 4.0–10.5)
nRBC: 0 % (ref 0.0–0.2)

## 2021-06-14 LAB — PROTIME-INR
INR: 2.4 — ABNORMAL HIGH (ref 0.8–1.2)
Prothrombin Time: 26 seconds — ABNORMAL HIGH (ref 11.4–15.2)

## 2021-06-14 LAB — MAGNESIUM: Magnesium: 2.3 mg/dL (ref 1.7–2.4)

## 2021-06-14 LAB — PHOSPHORUS: Phosphorus: 4.8 mg/dL — ABNORMAL HIGH (ref 2.5–4.6)

## 2021-06-14 MED ORDER — SODIUM CHLORIDE 0.9 % IV SOLN
2.0000 g | INTRAVENOUS | Status: AC
  Administered 2021-06-14 – 2021-06-16 (×3): 2 g via INTRAVENOUS
  Filled 2021-06-14 (×3): qty 20

## 2021-06-14 MED ORDER — POTASSIUM CHLORIDE 20 MEQ PO PACK
40.0000 meq | PACK | ORAL | Status: AC
  Administered 2021-06-14: 40 meq
  Filled 2021-06-14: qty 2

## 2021-06-14 MED ORDER — ORAL CARE MOUTH RINSE
15.0000 mL | Freq: Two times a day (BID) | OROMUCOSAL | Status: DC
  Administered 2021-06-14 – 2021-06-16 (×5): 15 mL via OROMUCOSAL

## 2021-06-14 MED ORDER — MAGNESIUM SULFATE 2 GM/50ML IV SOLN
2.0000 g | Freq: Once | INTRAVENOUS | Status: AC
  Administered 2021-06-14: 2 g via INTRAVENOUS
  Filled 2021-06-14: qty 50

## 2021-06-14 MED ORDER — ONDANSETRON HCL 4 MG/2ML IJ SOLN
4.0000 mg | Freq: Three times a day (TID) | INTRAMUSCULAR | Status: DC
Start: 2021-06-14 — End: 2021-06-14

## 2021-06-14 MED ORDER — ONDANSETRON HCL 4 MG/2ML IJ SOLN: 4.0000 mg | Freq: Three times a day (TID) | INTRAMUSCULAR | Status: DC | PRN

## 2021-06-14 MED ORDER — WARFARIN SODIUM 5 MG PO TABS
5.0000 mg | ORAL_TABLET | Freq: Once | ORAL | Status: AC
  Administered 2021-06-14: 5 mg via ORAL
  Filled 2021-06-14: qty 1

## 2021-06-14 MED ORDER — FENTANYL CITRATE PF 50 MCG/ML IJ SOSY: 12.5000 ug | PREFILLED_SYRINGE | INTRAMUSCULAR | Status: DC | PRN

## 2021-06-14 MED ORDER — FUROSEMIDE 10 MG/ML IJ SOLN
80.0000 mg | Freq: Two times a day (BID) | INTRAMUSCULAR | Status: AC
  Administered 2021-06-14 (×2): 80 mg via INTRAVENOUS
  Filled 2021-06-14 (×2): qty 8

## 2021-06-14 NOTE — Progress Notes (Signed)
Extubated earlier today.  Diuresing well O2 needs stable WOB acceptable.  Only real concern currently is what appears to be difficulty w/ clear liquids. On two separate occasions (once w. RN and then w/ my self) the pt is coughing when trying to drink clears. She is fully awake. Her voice quality is weak but seems to be slowly improving since extubation. I am hoping that this dysphagia is temporary and will improve w/time Plan Cont NPO for now Asking SLP to eval for BSS eval (perhaps some temporary diet modifications will be needed short term) If voice quality does not improve and evidence of dysphagia continues she may also need ENT eval to assess for post intubation associated injury such as vocal cord paralysis   Erick Colace ACNP-BC [place] Pager # [phone] OR # [phone] if no answer

## 2021-06-14 NOTE — Progress Notes (Addendum)
NAME:  [name] [name], MRN:  [id number], DOB:  [birth year], LOS: 2 ADMISSION DATE:  06/12/2021, CONSULTATION DATE:  06/14/21 REFERRING MD:  Benson Norway, CHIEF COMPLAINT:  acute respiratory failure   History of Present Illness:  [name] [name] is a 46 y.o. F with PMH of CHF, OSA on CPAP, history of DVT/PE on coumadin, HTN, iron deficiency anemia who presented to [facility] [place] with worsening shortness of breath.  CXR was significant for pulmonary edema vs pneumonia and pt required intubation.  BNP 2800, troponin 260 leukocytosis, she was started on Ceftriaxone and Azithromycin. Covid-19 was negative, she required fentanyl and versed for sedation with persistent agitation so paralytic was ordered and she was transferred to [facility].    Of note, she was recently admitted from 5/16-5/18 with CHF flare and treated with rocephin, azithromycin, IV lasix and required transfusion for menorrhagia.   Pertinent  Medical History   has a past medical history of Abdominal cramps (08/30/2015), Anemia, Blood transfusion without reported diagnosis, Closed left ankle fracture, Clotting disorder ([place]), DVT (deep venous thrombosis) ([place]), Fibroid tumor, Hidradenitis, High blood pressure, Hypertension, Obesity, OSA on CPAP, Oxygen deficiency, Pregnancy, Pulmonary embolism ([place]) (04/2013), Sleep apnea, and Tobacco use disorder.   Significant Hospital Events: Including procedures, antibiotic start and stop dates in addition to other pertinent events   5/23 Transferred from [facility] [place] [facility], intubated and sedated, stable. Working dx acute HF w/ pulm edema and PNA. Azithromycin and Cefepime started. Cultures sent. RVP negative, Ustrep neg.  5/24 failed spontaneous breathing trial with desaturation to 60s required titration up of FiO2 overnight, required lavage and suction twice for desaturation 5/25 now negative 4.9 liters.  Interim History / Subjective:  Failed spontaneous breathing trial first thing in the  morning due to sedation and apnea events. Tmax 99.1 No other events overnight. Objective   Blood pressure 116/69, pulse 65, temperature 98.7 F (37.1 C), temperature source Oral, resp. rate (Abnormal) 24, height '5\' 10"'$  (1.778 m), weight 128.8 kg, SpO2 99 %.    Vent Mode: PRVC FiO2 (%):  [40 %-45 %] 40 % Set Rate:  [24 bmp] 24 bmp Vt Set:  [410 mL] 410 mL PEEP:  [8 cmH20] 8 cmH20 Plateau Pressure:  [19 cmH20-20 cmH20] 20 cmH20   Intake/Output Summary (Last 24 hours) at 06/14/2021 0836 Last data filed at 06/14/2021 0630 Gross per 24 hour  Intake 961.6 ml  Output 5750 ml  Net -4788.4 ml   Filed Weights   06/13/21 0500 06/14/21 0435  Weight: 132.4 kg 128.8 kg  General otherwise healthy-appearing 47 year old female currently sedated on propofol and fentanyl infusions  HEENT normocephalic atraumatic no jugular venous distention appreciated she is orally intubated  Pulmonary: Diminished bilateral bases no accessory use.  Attempted pressure support, she is pulling 450 to 500 mL tidal volumes on pressure support of 10/PEEP 8.  Unfortunately continues to trigger apnea alarm due to continued sedating drips infusion Pcxr w/ persistent edema/airspace disease and volume loss.  Cardiac: Regular rate and rhythm without significant murmur rub or gallop Abdomen soft not tender tolerating tube feeds Extremities warm dry dependent edema Neuro awake when stimulated, briskly falls back asleep.  Moves all extremities appropriate Resolved Hospital Problem list   Elevated trop (felt 2/2 demand ischemia->resolved 5/23)  Assessment & Plan:   Principal Problem:   Acute respiratory failure with hypoxia (HCC) Active Problems:   Acute on chronic diastolic CHF (congestive heart failure) (HCC)   Acute on chronic systolic congestive heart failure ([place])  Pulmonary edema   Aspiration pneumonia (HCC)   Tobacco use disorder   OSA on CPAP   History of pulmonary embolism   Severe Iron deficiency anemia    Morbid obesity (HCC)   Chronic pain syndrome    Acute Hypoxic Respiratory Failure Most likely secondary to acute CHF exacerbation vs pneumonia  recently discharged after admission for pulmonary edema, echo from one week ago with LVEF 45-50% Plan Daily assessment for SBT and readiness to wean;  otherwise cont full vent support  Repeat lasix today and cont as long as BP/BUN/cr allow Day 3 azith and cefepime; f/u pending sputum PAD protocol RASS goal 0 BDs PRN VAP bundle  Decreasing sedation with hopes to get closer to extubation  History of DVT/PE Saddle PE diagnosed 2015 CT chest without PE last week Plan Coumadin per pharm   H/o OSA->not compliant w/ CPAP  Plan CPAP after extubation Will prob need repeat study at dc  Anemia w/out evidence of bleeding Plan Trend cbc Transfuse for hgb < 7   Best Practice (right click and "Reselect all SmartList Selections" daily)   Diet/type: tubefeeds DVT prophylaxis: Coumadin GI prophylaxis: PPI Lines: N/A Foley:  Yes, and it is still needed Code Status:  full code Last date of multidisciplinary goals of care discussion [pending] will try reach family   Critical care time: 24 min   Erick Colace ACNP-BC [place] Pager # [phone] OR # [phone] if no answer

## 2021-06-14 NOTE — Procedures (Signed)
Extubation Procedure Note  Patient Details:   Name: CASSANDRE OLEKSY DOB: 05-20-1975 MRN: [id number]   Airway Documentation:    Vent end date: (not recorded) Vent end time: (not recorded)   Evaluation  O2 sats: stable throughout Complications: No apparent complications Patient did tolerate procedure well. Bilateral Breath Sounds: Clear, Diminished   Yes  Elsie Stain 06/14/2021, 10:29 AM

## 2021-06-14 NOTE — Progress Notes (Signed)
Pt was extubated per doctors orders and protocol. The Pt's leak test Passed, she was able to follow direction. The Pt was extubated to 6L [place] and eventually turned her O2 to 4L She was able to talk and had a strong cough. RT will continue to monitor

## 2021-06-14 NOTE — Progress Notes (Signed)
115 mLs of fentanyl wasted to steri-cycle with Polly Cobia, RN.

## 2021-06-14 NOTE — Progress Notes (Signed)
  Transition of Care [facility]) Screening Note   Patient Details  Name: [name] [name] Date of Birth: [birth year]   Transition of Care [facility]) CM/SW Contact:    Lennart Pall, LCSW Phone Number: 06/14/2021, 1:21 PM    Transition of Care Department [facility]) has reviewed patient and no TOC needs have been identified at this time. We will continue to monitor patient advancement through interdisciplinary progression rounds. If new patient transition needs arise, please place a TOC consult.

## 2021-06-14 NOTE — Progress Notes (Signed)
Patient was extubated by RT per orders at 0951. OG tube also removed by RN at this time per protocol. Patient is currently on 4 L [place], O2 sat 93%. This RN will continue to carefully monitor pt for signs of respiratory distress.

## 2021-06-14 NOTE — Progress Notes (Signed)
ANTICOAGULATION CONSULT NOTE - Follow Up Consult  Pharmacy Consult for Coumadin Indication:  hx VTE  Allergies  Allergen Reactions   Sulfa Antibiotics Itching, Swelling and Rash    Lips swelling    Patient Measurements: Height: '5\' 10"'$  (177.8 cm) (per ECHO) Weight: 128.8 kg (283 lb 15.2 oz) IBW/kg (Calculated) : 68.5   Vital Signs: Temp: 98 F (36.7 C) (05/25 0820) Temp Source: Oral (05/25 0820) BP: 148/61 (05/25 1000) Pulse Rate: 91 (05/25 1000)  Labs: Recent Labs    06/12/21 1409 06/12/21 2011 06/13/21 0249 06/13/21 1027 06/13/21 1701 06/14/21 0302  HGB 8.9*  --  7.9*  --   --  8.5*  HCT 32.4*  --  28.5*  --   --  31.1*  PLT 261  --  264  --   --  247  APTT  --  37*  --   --   --   --   LABPROT  --  33.2* 32.3*  --   --  26.0*  INR  --  3.3* 3.2*  --   --  2.4*  CREATININE 0.95  --  0.81 0.79 0.95 0.86  TROPONINIHS 8  --   --   --   --   --      Estimated Creatinine Clearance: 122 mL/min (by C-G formula based on SCr of 0.86 mg/dL).   Medical History: Past Medical History:  Diagnosis Date   Abdominal cramps 08/30/2015   Anemia    Blood transfusion without reported diagnosis    Closed left ankle fracture    August 30 2012   Clotting disorder [facility])    DVT (deep venous thrombosis) (HCC)    L leg   Fibroid tumor    Hidradenitis    High blood pressure    Hypertension    Obesity    OSA on CPAP    Oxygen deficiency    Pregnancy    09/20/14 17 weeks   Pulmonary embolism ([place]) 04/2013   Sleep apnea    Tobacco use disorder     Medications:  Per Coumadin Clinic notes- she takes '10mg'$  daily except '15mg'$  on Tues and Sat.  Assessment: 46 yo F on warfarin PTA for hx DVT/PE.  INR is supra-therapeutic (3.3) on admission.  She is currently on broad-spectrum antibiotics (Cefepime/Zithromax) which can increase INR.  She has chronic anemia and is currently menstruating. Hg 8.9 on admission which appears to be around patient's baseline and higher than during last  week's admission. Of note, patient has required transfusion during 5/16 admit due to menorrhagia.  She was extubated this morning, 05/25 at 0951.   Today, 06/14/21 - INR in therapeutic range (2.4) - Hemoglobin up from 7.9 to 8.5 today - Monitor for bleeding given recent menstruation and anemia.   Goal of Therapy:  INR 2-3   Plan:  Give reduced dose of warfarin '5mg'$  tonight. May anticipate further INR decrease since dose held 5/23 (and likely 5/22).  Daily PT/INR, CBC Monitor for s/sx bleeding  Rickey Barbara, PharmD Candidate 06/14/2021 10:51 AM

## 2021-06-14 NOTE — Consult Note (Signed)
Cardiology Consultation:   Patient ID: [id number] [name] [name] MRN: [id number]; DOB: 1975/02/02  Admit date: 06/12/2021 Date of Consult: 06/14/2021  PCP:  Carrolyn Meiers, MD   Welcome Providers Cardiologist:  Carlyle Dolly, MD        Patient Profile:   [name] [name] is a 46 y.o. 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 Erin Fulling.  History of Present Illness:   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  Patient had a unprovoked saddle PE in 2015 and was placed on Coumadin therapy since.  Repeat CT showed resolution of the prior PE.  Echocardiogram in August 2019 showed EF 60 to 65%, no regional wall motion abnormality, normal RV.  Patient was last seen by Levell July, NP on 10/19/2020 at which time she was doing well.  She had lost significant amount of weight.  Unfortunately she had unprovoked DVT in January 2023 and was seen by Dr. Unk Lightning of vascular surgery.  Patient reportedly missed 2 doses of Coumadin prior to her DVT.  CTA was negative for PE, but does reveal groundglass opacity in the mid and lower lobe left greater than right which could represent either edema versus pneumonia/pneumonitis.  She ultimately percutaneous mechanical suction thrombectomy by Dr. Gwenlyn Saran of vascular surgery.  More recently, patient was admitted from 5/16 - 5/18 due to community-acquired pneumonia and acute on chronic diastolic heart failure.  Echocardiogram obtained on 06/05/2021 demonstrated EF 45 to 50%, global hypokinesis, mild LVH, severe LAE, mild MR.  She underwent IV diuresis.  Laboratory findings  at the time showed a BNP of 537.  She was found to have high-output heart failure in the setting of significant anemia.  She received 3 units of packed red blood cell.  Patient was also treated with IV Rocephin, azithromycin and Omnicef.  Discharge weight was 127.4 kg.  After her discharge, she initially felt well, however symptoms recurred 3 days later on 06/10/2021.  She was having significant nonproductive cough, orthopnea and PND symptom.  She says she was compliant with her diuretic therapy at home.  Due to worsening shortness of breath, patient ultimately presented to [facility] ED.  She required intubation in the emergency room.  Chest x-ray showed pulmonary edema and possible pneumonia.  COVID test is negative.  She was placed on ventilator and transferred to [facility].  Since admission, she has been treated with IV Lasix and antibiotics.  Patient was ultimately extubated on 06/14/2021 and oxygen requirement has weaned down to 4 L oxygen.  Cardiology service consulted for possible heart failure.    Past Medical History:  Diagnosis Date   Abdominal cramps 08/30/2015   Anemia    Blood transfusion without reported diagnosis    Closed left ankle fracture    August 30 2012   Clotting disorder [facility])    DVT (deep venous thrombosis) (HCC)    L leg   Fibroid tumor    Hidradenitis    High blood pressure    Hypertension    Obesity    OSA on CPAP    Oxygen deficiency    Pregnancy    09/20/14 17 weeks   Pulmonary embolism ([place]) 04/2013  Sleep apnea    Tobacco use disorder     Past Surgical History:  Procedure Laterality Date   ABSCESS DRAINAGE Bilateral 01/10/15   COLONOSCOPY WITH PROPOFOL N/A 06/08/2018   Procedure: COLONOSCOPY WITH PROPOFOL;  Surgeon: Daneil Dolin, MD;  Location: [facility]ITE;  Service: Endoscopy;  Laterality: N/A;  2:30pm   CYST REMOVAL TRUNK     IVC FILTER PLACEMENT ([place] HX)     LOWER EXTREMITY VENOGRAPHY N/A 02/07/2021   Procedure: LOWER EXTREMITY  VENOGRAPHY;  Surgeon: [name], MD;  Location: [place] CV LAB;  Service: Cardiovascular;  Laterality: N/A;   PERIPHERAL VASCULAR THROMBECTOMY N/A 02/07/2021   Procedure: PERIPHERAL VASCULAR THROMBECTOMY;  Surgeon: [name], MD;  Location: [place] CV LAB;  Service: Cardiovascular;  Laterality: N/A;     Home Medications:  Prior to Admission medications   Medication Sig Start Date End Date Taking? Authorizing Provider  albuterol (VENTOLIN HFA) 108 (90 Base) MCG/ACT inhaler Inhale 2 puffs into the lungs every 2 (two) hours as needed for wheezing or shortness of breath. 06/07/21   Roxan Hockey, MD  amLODipine (NORVASC) 10 MG tablet Take 1 tablet (10 mg total) by mouth daily. 06/07/21   Roxan Hockey, MD  ferrous sulfate 325 (65 FE) MG EC tablet Take 1 tablet (325 mg total) by mouth daily with breakfast. 06/07/21   Roxan Hockey, MD  furosemide (LASIX) 40 MG tablet Take 1 tablet (40 mg total) by mouth daily for 4 days. 06/07/21 06/11/21  Roxan Hockey, MD  guaiFENesin (MUCINEX) 600 MG 12 hr tablet Take 1 tablet (600 mg total) by mouth 2 (two) times daily for 10 days. 06/07/21 06/17/21  Roxan Hockey, MD  nicotine (NICODERM CQ - DOSED IN MG/24 HOURS) 14 mg/24hr patch Place 1 patch (14 mg total) onto the skin daily as needed (nicotine craving). 06/07/21   Roxan Hockey, MD  triamterene-hydrochlorothiazide (MAXZIDE-25) 37.5-25 MG tablet Take 0.5 tablets by mouth every morning. Take Half a tablet every Morning starting 06/11/21 after you finish Lasix/Furosemide 06/07/21   Roxan Hockey, MD  warfarin (COUMADIN) 10 MG tablet TAKE (1) OR (1) 1/2 TABLET BY MOUTH DAILY AS DIRECTED. 04/23/21   Arnoldo Lenis, MD    Inpatient Medications: Scheduled Meds:  Chlorhexidine Gluconate Cloth  6 each Topical Q0600   furosemide  80 mg Intravenous BID   mouth rinse  15 mL Mouth Rinse BID   potassium chloride  40 mEq Per Tube Q4H   warfarin  5 mg Oral ONCE-1600   Warfarin -  Pharmacist Dosing Inpatient   Does not apply q1600   Continuous Infusions:  azithromycin Stopped (06/13/21 1748)   cefTRIAXone (ROCEPHIN)  IV Stopped (06/14/21 1132)   PRN Meds: ondansetron (ZOFRAN) IV  Allergies:    Allergies  Allergen Reactions   Sulfa Antibiotics Itching, Swelling and Rash    Lips swelling    Social History:   Social History   Socioeconomic History   Marital status: Widowed    Spouse name: Not on file   Number of children: Not on file   Years of education: Not on file   Highest education level: Not on file  Occupational History   Not on file  Tobacco Use   Smoking status: Some Days    Packs/day: 0.25    Years: 18.00    Pack years: 4.50    Types: Cigarettes   Smokeless tobacco: Never   Tobacco comments:    smokes 3 cig a day ARJ 02/15/21  Vaping  Use   Vaping Use: Never used  Substance and Sexual Activity   Alcohol use: Yes    Alcohol/week: 2.0 standard drinks    Types: 2 Cans of beer per week    Comment: twice a month   Drug use: No   Sexual activity: Not Currently    Birth control/protection: Pill, None  Other Topics Concern   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    Has a 45 year old [place].   Live with parents.   Was working 5 days a weeks.   Not working right now.    Attends church.    Social Determinants of Health   Financial Resource Strain: Not on file  Food Insecurity: Not on file  Transportation Needs: Not on file  Physical Activity: Not on file  Stress: Not on file  Social Connections: Not on file  Intimate Partner Violence: Not on file    Family History:    Family History  Problem Relation Age of Onset   Hypertension Mother    Diabetes Paternal Aunt    Diabetes Paternal Uncle    Other Father        blood clots   Cancer Maternal Grandmother    Heart attack Paternal Grandmother    Colon cancer Neg Hx      ROS:  Please see the history of present  illness.   All other ROS reviewed and negative.     Physical Exam/Data:   Vitals:   06/14/21 1100 06/14/21 1123 06/14/21 1154 06/14/21 1200  BP: 137/63   136/82  Pulse: 84 77  86  Resp: (!) 25 (!) 26  19  Temp:   97.6 F (36.4 C)   TempSrc:   Oral   SpO2: 97% 95%  93%  Weight:      Height:        Intake/Output Summary (Last 24 hours) at 06/14/2021 1256 Last data filed at 06/14/2021 1157 Gross per 24 hour  Intake 1996.03 ml  Output 7300 ml  Net -5303.97 ml      06/14/2021    4:35 AM 06/13/2021    5:00 AM 06/07/2021    7:00 AM  Last 3 Weights  Weight (lbs) 283 lb 15.2 oz 291 lb 14.2 oz 280 lb 14.4 oz  Weight (kg) 128.8 kg 132.4 kg 127.415 kg     Body mass index is 40.74 kg/m.  General:  Well nourished, well developed, in no acute distress HEENT: normal Neck: no JVD Vascular: No carotid bruits; Distal pulses 2+ bilaterally Cardiac:  normal S1, S2; RRR; no murmur  Lungs:  clear to auscultation bilaterally, no rhonchi or rales.  Mild wheezing Abd: soft, nontender, no hepatomegaly  Ext: no edema Musculoskeletal:  No deformities, BUE and BLE strength normal and equal Skin: warm and dry  Neuro:  CNs 2-12 intact, no focal abnormalities noted Psych:  Normal affect   EKG:  The EKG was personally reviewed and demonstrates: Normal sinus rhythm, left anterior fascicular block Telemetry:  Telemetry was personally reviewed and demonstrates: Normal sinus rhythm, heart rate 60 to 80s.  Relevant CV Studies:  Echo 06/05/2021 1. Left ventricular ejection fraction, by estimation, is 45 to 50%. The  left ventricle has mildly decreased function. The left ventricle  demonstrates global hypokinesis. The left ventricular internal cavity size  was moderately dilated. There is mild  left ventricular hypertrophy. Left ventricular diastolic parameters are  indeterminate.  2. Right ventricular systolic function is normal. The right ventricular  size is normal. There is severely elevated  pulmonary artery systolic  pressure.   3. Left atrial size was severely dilated.   4. Right atrial size was mildly dilated.   5. The mitral valve is abnormal. Mild mitral valve regurgitation. No  evidence of mitral stenosis.   6. The tricuspid valve is abnormal.   7. The aortic valve is tricuspid. Aortic valve regurgitation is not  visualized. No aortic stenosis is present.   8. The inferior vena cava is normal in size with greater than 50%  respiratory variability, suggesting right atrial pressure of 3 mmHg.   Laboratory Data:  High Sensitivity Troponin:   Recent Labs  Lab 06/04/21 2321 06/12/21 1409  TROPONINIHS 13 8     Chemistry Recent Labs  Lab 06/13/21 0249 06/13/21 1027 06/13/21 1701 06/14/21 0302  NA 138 139 140 141  K 3.9 4.0 4.5 3.8  CL 107 106 105 104  CO2 '25 27 29 30  '$ GLUCOSE 88 83 83 79  BUN 27* 31* 32* 31*  CREATININE 0.81 0.79 0.95 0.86  CALCIUM 8.9 8.8* 8.9 8.6*  MG 2.2  --  2.5* 2.3  GFRNONAA >60 >60 >60 >60  ANIONGAP '6 6 6 7    '$ Recent Labs  Lab 06/12/21 1409 06/13/21 0249  PROT 7.2 6.7  ALBUMIN 3.0* 2.8*  AST 22 18  ALT 17 17  ALKPHOS 38 36*  BILITOT 0.4 0.5   Lipids  Recent Labs  Lab 06/13/21 0249  TRIG 128    Hematology Recent Labs  Lab 06/12/21 1409 06/13/21 0249 06/14/21 0302  WBC 9.2 7.4 6.7  RBC 4.50 3.98 4.34  HGB 8.9* 7.9* 8.5*  HCT 32.4* 28.5* 31.1*  MCV 72.0* 71.6* 71.7*  MCH 19.8* 19.8* 19.6*  MCHC 27.5* 27.7* 27.3*  RDW 26.4* 26.3* 26.3*  PLT 261 264 247   Thyroid No results for input(s): TSH, FREET4 in the last 168 hours.  BNP Recent Labs  Lab 06/12/21 1409  BNP 293.6*    DDimer No results for input(s): DDIMER in the last 168 hours.   Radiology/Studies:  DG Chest Port 1 View  Result Date: 06/14/2021 CLINICAL DATA:  A 46 year old female presents for evaluation of pneumonia. EXAM: PORTABLE CHEST 1 VIEW COMPARISON:  Jun 13, 2021. FINDINGS: EKG leads project over the chest. Endotracheal tube approximately  5.5 cm from the carina, tip between clavicular heads. Previously approximately 3 cm from the carina. Gastric tube courses through in off the field of the radiograph. Appearance of the chest is stable otherwise when compared to the previous exam with graded opacity in the LEFT and RIGHT chest and obscured LEFT and RIGHT hemidiaphragms with increased interstitial markings. On limited assessment there is no acute skeletal finding. IMPRESSION: 1. Endotracheal tube approximately 5.5 cm from the carina, tip between clavicular heads. This may have been retracted slightly since previous imaging. Attention on follow-up, could consider approximately 1 cm advancement as warranted for more optimal placement. 2. Stable appearance of interstitial prominence, basilar airspace disease and bilateral effusions. These results will be called to the ordering clinician or representative by the Radiologist Assistant, and communication documented in the PACS or [employer]. Electronically Signed   By: Zetta Bills M.D.   On: 06/14/2021 08:07   DG Chest Port 1 View  Result Date: 06/13/2021 CLINICAL DATA:  Pneumonia follow-up EXAM: PORTABLE CHEST 1 VIEW COMPARISON:  06/12/2021 FINDINGS: Endotracheal and enteric tubes are again identified. Similar  cardiomegaly. Patchy pulmonary opacities remain present. Aeration is slightly improved. No significant pleural effusions. IMPRESSION: Persistent patchy bilateral pulmonary opacities with some improvement in aeration since 06/12/2021. Electronically Signed   By: Macy Mis M.D.   On: 06/13/2021 11:08   DG CHEST PORT 1 VIEW  Result Date: 06/12/2021 CLINICAL DATA:  Provided history: Pulmonary edema, acute. EXAM: PORTABLE CHEST 1 VIEW COMPARISON:  Chest radiographs 06/07/2021. CT angiogram chest 06/05/2021. FINDINGS: Interval intubation. The ET tube terminates at the level of the clavicular heads. Interval placement of an enteric tube. The enteric tube passes below the level of the  left hemidiaphragm with tip excluded from the field of view. Cardiomegaly. Interstitial and ill-defined airspace opacities throughout both lungs, progressed from the prior chest radiographs of 06/07/2021. No definite pleural effusion or evidence of pneumothorax. No acute bony abnormality identified. IMPRESSION: Support apparatus as described. Cardiomegaly. Interstitial and ill-defined airspace opacities throughout both lungs, progressed from the prior chest radiographs of 06/07/2021. Primary differential considerations include pulmonary edema or atypical/viral pneumonia. Electronically Signed   By: Kellie Simmering D.O.   On: 06/12/2021 13:55     Assessment and Plan:   Acute on chronic combined systolic and diastolic heart failure  -On 80 mg twice daily of IV Lasix, current I/O -6L, Renal function is stable  -Recent echocardiogram shows EF mildly down to 45 to 50%, echocardiogram obtained in the setting of community-acquired pneumonia.  On physical exam, she has no lower extremity edema, minimal JVD, her lung is largely clear with mild expiratory wheezing.  -She was extubated this morning and currently weaned down to 4 L/min oxygen.  Would recommend continue on the IV Lasix and potassium supplement, although patient does not seems to be significantly volume overloaded.  We will continue to challenge the patient as long as renal function is stable.  Hospital-acquired pneumonia: Receiving IV Rocephin and azithromycin.  Acute respiratory failure requiring intubation: Likely related to mild heart failure and also pneumonia  Pulmonary hypertension: Echocardiogram in April 2015 demonstrated PA peak pressure 53 mmHg, however this was obtained in the setting of acute PE.  Echo in August 2019 did not mention pulmonary artery pressure.  Recent echocardiogram obtained a week ago demonstrated RV systolic pressure 61 mmHg, however this was obtained in the setting of community-acquired pneumonia.  She has a history of  obstructive sleep apnea.  Consider repeat limited echocardiogram to assess RVSP after she recovers  Hypertension  Recurrent DVT/PE: On lifelong Coumadin  History of anemia secondary to leiomyoma/menorrhagia    Risk Assessment/Risk Scores:        New York Heart Association (NYHA) Functional Class NYHA Class IV        For questions or updates, please contact [facility] Please consult [email or web address] for contact info under    Hilbert Corrigan, [place]  06/14/2021 12:56 PM

## 2021-06-15 ENCOUNTER — Inpatient Hospital Stay (HOSPITAL_COMMUNITY): Payer: Medicare HMO

## 2021-06-15 DIAGNOSIS — I5023 Acute on chronic systolic (congestive) heart failure: Secondary | ICD-10-CM | POA: Diagnosis not present

## 2021-06-15 DIAGNOSIS — I5033 Acute on chronic diastolic (congestive) heart failure: Secondary | ICD-10-CM

## 2021-06-15 DIAGNOSIS — J9601 Acute respiratory failure with hypoxia: Secondary | ICD-10-CM | POA: Diagnosis not present

## 2021-06-15 DIAGNOSIS — I10 Essential (primary) hypertension: Secondary | ICD-10-CM

## 2021-06-15 DIAGNOSIS — J810 Acute pulmonary edema: Secondary | ICD-10-CM | POA: Diagnosis not present

## 2021-06-15 LAB — BASIC METABOLIC PANEL
Anion gap: 9 (ref 5–15)
BUN: 24 mg/dL — ABNORMAL HIGH (ref 6–20)
CO2: 30 mmol/L (ref 22–32)
Calcium: 9.5 mg/dL (ref 8.9–10.3)
Chloride: 103 mmol/L (ref 98–111)
Creatinine, Ser: 0.67 mg/dL (ref 0.44–1.00)
GFR, Estimated: >60 mL/min (ref >60)
Glucose, Bld: 109 mg/dL — ABNORMAL HIGH (ref 70–99)
Potassium: 3.7 mmol/L (ref 3.5–5.1)
Sodium: 142 mmol/L (ref 135–145)

## 2021-06-15 LAB — CBC
HCT: 30.6 % — ABNORMAL LOW (ref 36.0–46.0)
Hemoglobin: 8.3 g/dL — ABNORMAL LOW (ref 12.0–15.0)
MCH: 19.2 pg — ABNORMAL LOW (ref 26.0–34.0)
MCHC: 27.1 g/dL — ABNORMAL LOW (ref 30.0–36.0)
MCV: 70.8 fL — ABNORMAL LOW (ref 80.0–100.0)
Platelets: 272 10*3/uL (ref 150–400)
RBC: 4.32 MIL/uL (ref 3.87–5.11)
RDW: 26.5 % — ABNORMAL HIGH (ref 11.5–15.5)
WBC: 8.6 10*3/uL (ref 4.0–10.5)
nRBC: 0 % (ref 0.0–0.2)

## 2021-06-15 LAB — PROTIME-INR
INR: 1.5 — ABNORMAL HIGH (ref 0.8–1.2)
Prothrombin Time: 18 seconds — ABNORMAL HIGH (ref 11.4–15.2)

## 2021-06-15 LAB — HEPARIN LEVEL (UNFRACTIONATED): Heparin Unfractionated: 0.11 IU/mL — ABNORMAL LOW (ref 0.30–0.70)

## 2021-06-15 MED ORDER — FUROSEMIDE 10 MG/ML IJ SOLN
80.0000 mg | Freq: Two times a day (BID) | INTRAMUSCULAR | Status: DC
  Administered 2021-06-15 – 2021-06-22 (×15): 80 mg via INTRAVENOUS
  Filled 2021-06-15 (×15): qty 8

## 2021-06-15 MED ORDER — HEPARIN BOLUS VIA INFUSION
1000.0000 [IU] | Freq: Once | INTRAVENOUS | Status: AC
  Administered 2021-06-15: 1000 [IU] via INTRAVENOUS
  Filled 2021-06-15: qty 1000

## 2021-06-15 MED ORDER — HEPARIN (PORCINE) 25000 UT/250ML-% IV SOLN
1950.0000 [IU]/h | INTRAVENOUS | Status: DC
  Administered 2021-06-15: 1950 [IU]/h via INTRAVENOUS
  Administered 2021-06-15: 1750 [IU]/h via INTRAVENOUS
  Administered 2021-06-16: 1950 [IU]/h via INTRAVENOUS
  Filled 2021-06-15 (×3): qty 250

## 2021-06-15 MED ORDER — HEPARIN BOLUS VIA INFUSION
3000.0000 [IU] | Freq: Once | INTRAVENOUS | Status: AC
Start: 2021-06-15 — End: 2021-06-15
  Administered 2021-06-15: 3000 [IU] via INTRAVENOUS
  Filled 2021-06-15: qty 3000

## 2021-06-15 NOTE — Progress Notes (Signed)
ANTICOAGULATION CONSULT NOTE - Follow Up Consult  Pharmacy Consult for Heparin Indication:  h/o DVT/PE  Allergies  Allergen Reactions   Sulfa Antibiotics Itching, Swelling and Rash    Lips swelling    Patient Measurements: Height: '5\' 10"'$  (177.8 cm) (per ECHO) Weight: 123.8 kg (272 lb 14.9 oz) IBW/kg (Calculated) : 68.5 Heparin Dosing Weight: 97 kg  Vital Signs: Temp: 97.6 F (36.4 C) (05/26 1240) Temp Source: Oral (05/26 1240) BP: 136/78 (05/26 1800) Pulse Rate: 84 (05/26 1800)  Labs: Recent Labs    06/12/21 2011 06/12/21 2011 06/13/21 0249 06/13/21 1027 06/14/21 0302 06/14/21 1722 06/14/21 2227 06/15/21 0300 06/15/21 1019 06/15/21 1758  HGB  --    < > 7.9*  --  8.5*  --   --  8.3*  --   --   HCT  --   --  28.5*  --  31.1*  --   --  30.6*  --   --   PLT  --   --  264  --  247  --   --  272  --   --   APTT 37*  --   --   --   --   --   --   --   --   --   LABPROT 33.2*  --  32.3*  --  26.0*  --   --  18.0*  --   --   INR 3.3*  --  3.2*  --  2.4*  --   --  1.5*  --   --   HEPARINUNFRC  --   --   --   --   --   --   --   --   --  0.11*  CREATININE  --   --  0.81   < > 0.86 0.80 0.83  --  0.67  --    < > = values in this interval not displayed.    Estimated Creatinine Clearance: 128.4 mL/min (by C-G formula based on SCr of 0.67 mg/dL).   Assessment:  AC/Heme: on warfarin PTA for hx DVT/PE- INR 3.3 on admit [place]. HOLD COUMADIN>>IV heparin. - INR down to 1.5, Hgb 8.3. Plts WNL.Hep level 0.11 below goal.  Goal of Therapy:  Heparin level 0.3-0.7 units/ml Monitor platelets by anticoagulation protocol: Yes   Plan:  Heparin 3000 unit bolus Increase Heparin infusion to 1950 units/hr Daily HL and CBC     Jasma Seevers S. Alford Highland, PharmD, BCPS Clinical Staff Pharmacist [email or web address] Alford Highland, [employer] 06/15/2021,7:10 PM

## 2021-06-15 NOTE — Progress Notes (Signed)
ANTICOAGULATION CONSULT NOTE - Initial Consult  Pharmacy Consult for Heparin while Warfarin on hold Indication: Hx of PE/DVT  Allergies  Allergen Reactions   Sulfa Antibiotics Itching, Swelling and Rash    Lips swelling    Patient Measurements: Height: '5\' 10"'$  (177.8 cm) (per ECHO) Weight: 123.8 kg (272 lb 14.9 oz) IBW/kg (Calculated) : 68.5 Heparin Dosing Weight: actual  Vital Signs: Temp: 98.1 F (36.7 C) (05/26 0810) Temp Source: Oral (05/26 0810) BP: 149/70 (05/26 0900) Pulse Rate: 67 (05/26 0900)  Labs: Recent Labs    06/12/21 1409 06/12/21 2011 06/12/21 2011 06/13/21 0249 06/13/21 1027 06/14/21 0302 06/14/21 1722 06/14/21 2227 06/15/21 0300  HGB 8.9*  --   --  7.9*  --  8.5*  --   --  8.3*  HCT 32.4*  --   --  28.5*  --  31.1*  --   --  30.6*  PLT 261  --   --  264  --  247  --   --  272  APTT  --  37*  --   --   --   --   --   --   --   LABPROT  --  33.2*   < > 32.3*  --  26.0*  --   --  18.0*  INR  --  3.3*   < > 3.2*  --  2.4*  --   --  1.5*  CREATININE 0.95  --   --  0.81   < > 0.86 0.80 0.83  --   TROPONINIHS 8  --   --   --   --   --   --   --   --    < > = values in this interval not displayed.    Estimated Creatinine Clearance: 123.7 mL/min (by C-G formula based on SCr of 0.83 mg/dL).   Medical History: Past Medical History:  Diagnosis Date   Abdominal cramps 08/30/2015   Anemia    Blood transfusion without reported diagnosis    Closed left ankle fracture    August 30 2012   Clotting disorder [facility])    DVT (deep venous thrombosis) (HCC)    L leg   Fibroid tumor    Hidradenitis    High blood pressure    Hypertension    Obesity    OSA on CPAP    Oxygen deficiency    Pregnancy    09/20/14 17 weeks   Pulmonary embolism ([place]) 04/2013   Sleep apnea    Tobacco use disorder     Medications:  Scheduled:   Chlorhexidine Gluconate Cloth  6 each Topical Q0600   furosemide  80 mg Intravenous BID   heparin  1,000 Units Intravenous Once   mouth  rinse  15 mL Mouth Rinse BID   Warfarin - Pharmacist Dosing Inpatient   Does not apply q1600   Infusions:   azithromycin Stopped (06/14/21 1724)   cefTRIAXone (ROCEPHIN)  IV 2 g (06/15/21 1014)   heparin     PRN: ondansetron (ZOFRAN) IV  Per Coumadin Clinic notes- she takes '10mg'$  daily except '15mg'$  on Tues and Sat  Assessment: 46 yo F on warfarin PTA for hx DVT/PE.   INR was supra-therapeutic (3.3) on admission so dose was held; low doses resumed 5/24-25.  Now warfarin is on hold and Pharmacy consulted to dose IV heparin in preparation for cardiac cath on Tuesday 5/30.  She has chronic anemia and is currently menstruating. Hgb low but  stable at 8.3, Plts WNL. Baseline aPTT = 37 on 5/23.  Goal of Therapy:  Heparin level 0.3-0.7 units/ml Monitor platelets by anticoagulation protocol: Yes   Plan:  Give 1000 units bolus x 1 Start heparin infusion at 1750 units/hr Check anti-Xa level in 6 hours and daily while on heparin Continue to monitor H&H and platelets  Peggyann Juba, PharmD, BCPS Pharmacy: [phone] 06/15/2021,10:35 AM

## 2021-06-15 NOTE — Progress Notes (Signed)
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  Patient had a unprovoked saddle PE in 2015 and was placed on Coumadin therapy since.  Repeat CT showed resolution of the prior PE.  Echocardiogram in August 2019 showed EF 60 to 65%, no regional wall motion abnormality, normal RV.  Patient was last seen by Levell July, NP on 10/19/2020 at which time she was doing well.  She had lost significant amount of weight.  Unfortunately she had unprovoked DVT in January 2023 and was seen by Dr. Unk Lightning of vascular surgery.  Patient reportedly missed 2 doses of Coumadin prior to her DVT.  CTA was negative for PE, but does reveal groundglass opacity in the mid and lower lobe left greater than right which could represent either edema versus pneumonia/pneumonitis.  She ultimately percutaneous mechanical suction thrombectomy by Dr. Gwenlyn Saran of vascular surgery.  More recently, patient was admitted from 5/16 - 5/18 due to community-acquired pneumonia and acute on chronic diastolic heart failure.  Echocardiogram obtained on 06/05/2021 demonstrated EF 45 to 50%, global hypokinesis, mild LVH, severe LAE, mild MR.  She underwent IV diuresis.  Laboratory findings at the time showed a BNP of 537.  She was found to have high-output heart failure in the setting of significant anemia.  She received 3 units of packed red blood cell.  Patient was also treated with IV Rocephin, azithromycin and Omnicef.  Discharge weight was 127.4 kg.  After her discharge, she initially felt well, however symptoms recurred 3 days later on 06/10/2021.  She was having significant nonproductive cough, orthopnea and PND symptom.  She says she was compliant  with her diuretic therapy at home.   Due to worsening shortness of breath, patient ultimately presented to [facility] ED.  She required intubation in the emergency room.  Chest x-ray showed pulmonary edema and possible pneumonia.  COVID test is negative.  She was placed on ventilator and transferred to [facility].  Since admission, she has been treated with IV Lasix and antibiotics.  Patient was ultimately extubated on 06/14/2021 and oxygen requirement has weaned down to 4 L oxygen.  Cardiology service consulted for possible heart failure.   Assessment & Plan:  Acute on chronic combined systolic and diastolic heart failure             -Recent echocardiogram shows EF mildly down to 45 to 50%, echocardiogram obtained in the setting of community-acquired pneumonia.          -obtain BMET. CXR this morning continue to show possible pulm edema. Resume IV lasix '80mg'$  BID, likely decrease tomorrow. I/O -9.7L. Still requires 4L per min O2. I suspect the patient is near euvolemic level, however will continue lasix as long as her renal function is stable. Dr. Johnsie Cancel recommended left and right heart cath, planned cath board for Tue 10:30AM with Dr. Tamala Julian (cath is closed on Mon for [facility] day) -now on heparin gtt   2 Hospital-acquired pneumonia: Receiving IV Rocephin and azithromycin.   3  Acute respiratory failure requiring intubation: Likely related to mild heart failure and also pneumonia, now extubated doing well   4 Pulmonary hypertension: Echocardiogram in April 2015 demonstrated PA peak pressure 53 mmHg, however this was obtained in the setting  of acute PE.  Echo in August 2019 did not mention pulmonary artery pressure.  Recent echocardiogram obtained a week ago demonstrated RV systolic pressure 61 mmHg, however this was obtained in the setting of community-acquired pneumonia.  She has a history of obstructive sleep apnea.     5 Hypertension: c/w lasix   6 Recurrent DVT/PE: On lifelong  Coumadin, holding currently, on hep gtt   7 History of anemia secondary to leiomyoma/menorrhagia: monitor cbc on hep gtt     DVT prophylaxis: On:hep gtt   Code Status: full    Code Status Orders  (From admission, onward)           Start     Ordered   06/12/21 1530  Full code  Continuous        06/12/21 1529           Code Status History     Date Active Date Inactive Code Status Order ID Comments User Context   06/05/2021 0422 06/07/2021 1810 Full Code [id number]  Mansy, Arvella Merles, MD Inpatient   02/24/2015 1405 02/26/2015 1529 Full Code [id number]  Smiley Houseman, MD Inpatient   02/23/2015 0716 02/24/2015 1405 Full Code [id number]  Myrtis Ser, CNM Inpatient   07/12/2013 1004 07/13/2013 0337 Full Code [id number]  Greggory Keen, MD HOV   04/29/2013 0957 04/30/2013 0336 Full Code [id number]  Greggory Keen, MD HOV   04/27/2013 2200 04/29/2013 0957 Full Code [id number]  Theressa Millard, MD Inpatient      Family Communication: discussed with patiwents mother  Disposition Plan:   Heart cath Tuesday, pt not medicallystable for d/c Consults called: None Admission status: Inpatient   Consultants:  Cards, pccm  Procedures:  DG Chest 2 View  Result Date: 06/07/2021 CLINICAL DATA:  Cough EXAM: CHEST - 2 VIEW COMPARISON:  06/04/2021 FINDINGS: Stable heart size. Mild pulmonary vascular congestion with bilateral perihilar and bibasilar interstitial opacities. Slightly improving aeration within the left lung base compared to prior. No pleural effusion or pneumothorax. IMPRESSION: Persistent bilateral interstitial opacities with slightly improving aeration within the left lung base compared to prior. Electronically Signed   By: Davina Poke D.O.   On: 06/07/2021 08:24   DG Chest 2 View  Result Date: 06/04/2021 CLINICAL DATA:  Shortness of breath for 3 days with productive cough. Intermittent chest pain and lethargy. EXAM: CHEST - 2 VIEW COMPARISON:  Radiographs 04/27/2013. Chest CT  10/21/2013. Abdominal CT 01/30/2021. FINDINGS: The heart size and mediastinal contours are normal. There is vascular congestion with new interstitial opacities in both lungs, diffuse fissural thickening and patchy left lower lobe airspace disease. No pneumothorax or significant pleural effusion. The bones appear unchanged. There are degenerative changes throughout the thoracic spine. IMPRESSION: Vascular congestion with diffuse interstitial opacities, patchy left lower lobe airspace disease and diffuse interstitial thickening. Findings may be secondary to pulmonary edema or atypical infection. Electronically Signed   By: Richardean Sale M.D.   On: 06/04/2021 14:10   CT Angio Chest PE W and/or Wo Contrast  Result Date: 06/05/2021 CLINICAL DATA:  Pulmonary embolism (PE) suspected, high prob. Productive cough, shortness of breath, occasional chest pain EXAM: CT ANGIOGRAPHY CHEST WITH CONTRAST TECHNIQUE: Multidetector CT imaging of the chest was performed using the standard protocol during bolus administration of intravenous contrast. Multiplanar CT image reconstructions and MIPs were obtained to evaluate the vascular anatomy. RADIATION DOSE REDUCTION: This exam was performed according to the departmental dose-optimization program which includes automated exposure control, adjustment of the mA and/or  kV according to patient size and/or use of iterative reconstruction technique. CONTRAST:  112m OMNIPAQUE IOHEXOL 350 MG/ML SOLN COMPARISON:  10/21/2013 FINDINGS: Cardiovascular: Heart is normal size. Aorta is normal caliber. No filling defects in the pulmonary arteries to suggest pulmonary emboli. Mediastinum/Nodes: No mediastinal, hilar, or axillary adenopathy. Trachea and esophagus are unremarkable. Thyroid unremarkable. Lungs/Pleura: Ground-glass opacities in the mid and lower lungs. This could reflect edema or atypical infection/pneumonitis. No effusions. Upper Abdomen: No acute findings Musculoskeletal: Chest wall  soft tissues are unremarkable. No acute bony abnormality. Review of the MIP images confirms the above findings. IMPRESSION: No evidence of pulmonary embolus. Ground-glass opacities in the mid and lower lungs, left greater than right. This could reflect edema or pneumonia/pneumonitis. Electronically Signed   By: KRolm BaptiseM.D.   On: 06/05/2021 01:15   DG Chest Port 1 View  Result Date: 06/15/2021 CLINICAL DATA:  Pulmonary edema. EXAM: PORTABLE CHEST 1 VIEW COMPARISON:  Jun 14, 2021. FINDINGS: Stable cardiomediastinal silhouette. Bilateral perihilar and basilar opacities are noted concerning for pulmonary edema or possibly infiltrate. Bony thorax is unremarkable. Endotracheal and nasogastric tubes have been removed. IMPRESSION: Bilateral perihilar and basilar opacities are noted concerning for pulmonary edema or possibly infiltrates. Electronically Signed   By: JMarijo ConceptionM.D.   On: 06/15/2021 08:36   DG Chest Port 1 View  Result Date: 06/14/2021 CLINICAL DATA:  A 46year old female presents for evaluation of pneumonia. EXAM: PORTABLE CHEST 1 VIEW COMPARISON:  Jun 13, 2021. FINDINGS: EKG leads project over the chest. Endotracheal tube approximately 5.5 cm from the carina, tip between clavicular heads. Previously approximately 3 cm from the carina. Gastric tube courses through in off the field of the radiograph. Appearance of the chest is stable otherwise when compared to the previous exam with graded opacity in the LEFT and RIGHT chest and obscured LEFT and RIGHT hemidiaphragms with increased interstitial markings. On limited assessment there is no acute skeletal finding. IMPRESSION: 1. Endotracheal tube approximately 5.5 cm from the carina, tip between clavicular heads. This may have been retracted slightly since previous imaging. Attention on follow-up, could consider approximately 1 cm advancement as warranted for more optimal placement. 2. Stable appearance of interstitial prominence, basilar  airspace disease and bilateral effusions. These results will be called to the ordering clinician or representative by the Radiologist Assistant, and communication documented in the PACS or [employer] Electronically Signed   By: GZetta BillsM.D.   On: 06/14/2021 08:07   DG Chest Port 1 View  Result Date: 06/13/2021 CLINICAL DATA:  Pneumonia follow-up EXAM: PORTABLE CHEST 1 VIEW COMPARISON:  06/12/2021 FINDINGS: Endotracheal and enteric tubes are again identified. Similar cardiomegaly. Patchy pulmonary opacities remain present. Aeration is slightly improved. No significant pleural effusions. IMPRESSION: Persistent patchy bilateral pulmonary opacities with some improvement in aeration since 06/12/2021. Electronically Signed   By: PMacy MisM.D.   On: 06/13/2021 11:08   DG CHEST PORT 1 VIEW  Result Date: 06/12/2021 CLINICAL DATA:  Provided history: Pulmonary edema, acute. EXAM: PORTABLE CHEST 1 VIEW COMPARISON:  Chest radiographs 06/07/2021. CT angiogram chest 06/05/2021. FINDINGS: Interval intubation. The ET tube terminates at the level of the clavicular heads. Interval placement of an enteric tube. The enteric tube passes below the level of the left hemidiaphragm with tip excluded from the field of view. Cardiomegaly. Interstitial and ill-defined airspace opacities throughout both lungs, progressed from the prior chest radiographs of 06/07/2021. No definite pleural effusion or evidence of pneumothorax. No acute bony abnormality identified. IMPRESSION:  Support apparatus as described. Cardiomegaly. Interstitial and ill-defined airspace opacities throughout both lungs, progressed from the prior chest radiographs of 06/07/2021. Primary differential considerations include pulmonary edema or atypical/viral pneumonia. Electronically Signed   By: Kellie Simmering D.O.   On: 06/12/2021 13:55   ECHOCARDIOGRAM COMPLETE  Result Date: 06/05/2021    ECHOCARDIOGRAM REPORT   Patient Name:   OMER MONTER Date  of Exam: 06/05/2021 Medical Rec #:  [id number]       Height:       70.0 in Accession #:    [id number]      Weight:       282.4 lb Date of Birth:  1975/12/08       BSA:          2.417 m Patient Age:    46 years        BP:           115/65 mmHg Patient Gender: F               HR:           84 bpm. Exam Location:  [facility] Procedure: 2D Echo, Cardiac Doppler, Color Doppler and Intracardiac            Opacification Agent Indications:    CHF  History:        Patient has prior history of Echocardiogram examinations, most                 recent 09/03/2017. CHF, Pulmonary HTN, Signs/Symptoms:Shortness                 of Breath; Risk Factors:Hypertension and Current Smoker. Saddle                 embolus of Pulmonary artery, S/P IVC filter.  Sonographer:    Wenda Low Referring Phys: [id number] JAN A Latty  1. Left ventricular ejection fraction, by estimation, is 45 to 50%. The left ventricle has mildly decreased function. The left ventricle demonstrates global hypokinesis. The left ventricular internal cavity size was moderately dilated. There is mild left ventricular hypertrophy. Left ventricular diastolic parameters are indeterminate.  2. Right ventricular systolic function is normal. The right ventricular size is normal. There is severely elevated pulmonary artery systolic pressure.  3. Left atrial size was severely dilated.  4. Right atrial size was mildly dilated.  5. The mitral valve is abnormal. Mild mitral valve regurgitation. No evidence of mitral stenosis.  6. The tricuspid valve is abnormal.  7. The aortic valve is tricuspid. Aortic valve regurgitation is not visualized. No aortic stenosis is present.  8. The inferior vena cava is normal in size with greater than 50% respiratory variability, suggesting right atrial pressure of 3 mmHg. FINDINGS  Left Ventricle: Left ventricular ejection fraction, by estimation, is 45 to 50%. The left ventricle has mildly decreased function. The left ventricle  demonstrates global hypokinesis. Definity contrast agent was given IV to delineate the left ventricular  endocardial borders. The left ventricular internal cavity size was moderately dilated. There is mild left ventricular hypertrophy. Left ventricular diastolic parameters are indeterminate. Right Ventricle: The right ventricular size is normal. Right vetricular wall thickness was not well visualized. Right ventricular systolic function is normal. There is severely elevated pulmonary artery systolic pressure. The tricuspid regurgitant velocity is 3.64 m/s, and with an assumed right atrial pressure of 8 mmHg, the estimated right ventricular systolic pressure is 93.7 mmHg. Left Atrium: Left atrial size was severely dilated. Right Atrium:  Right atrial size was mildly dilated. Pericardium: There is no evidence of pericardial effusion. Mitral Valve: The mitral valve is abnormal. Mild mitral valve regurgitation. No evidence of mitral valve stenosis. MV peak gradient, 10.0 mmHg. The mean mitral valve gradient is 3.0 mmHg. Tricuspid Valve: The tricuspid valve is abnormal. Tricuspid valve regurgitation is mild . No evidence of tricuspid stenosis. Aortic Valve: The aortic valve is tricuspid. Aortic valve regurgitation is not visualized. No aortic stenosis is present. Aortic valve mean gradient measures 4.0 mmHg. Aortic valve peak gradient measures 7.4 mmHg. Aortic valve area, by VTI measures 3.22 cm. Pulmonic Valve: The pulmonic valve was not well visualized. Pulmonic valve regurgitation is not visualized. No evidence of pulmonic stenosis. Aorta: The aortic root is normal in size and structure. Venous: The inferior vena cava is normal in size with greater than 50% respiratory variability, suggesting right atrial pressure of 3 mmHg. IAS/Shunts: No atrial level shunt detected by color flow Doppler.  LEFT VENTRICLE PLAX 2D LVIDd:         6.60 cm      Diastology LVIDs:         5.00 cm      LV e' medial:    9.03 cm/s LV PW:          1.30 cm      LV E/e' medial:  14.4 LV IVS:        1.20 cm      LV e' lateral:   9.36 cm/s LVOT diam:     2.20 cm      LV E/e' lateral: 13.9 LV SV:         86 LV SV Index:   36 LVOT Area:     3.80 cm  LV Volumes (MOD) LV vol d, MOD A4C: 167.0 ml LV vol s, MOD A4C: 72.6 ml LV SV MOD A4C:     167.0 ml RIGHT VENTRICLE RV Basal diam:  4.45 cm RV S prime:     11.40 cm/s LEFT ATRIUM              Index        RIGHT ATRIUM           Index LA diam:        5.60 cm  2.32 cm/m   RA Area:     22.20 cm LA Vol (A2C):   127.0 ml 52.54 ml/m  RA Volume:   72.00 ml  29.79 ml/m LA Vol (A4C):   136.0 ml 56.27 ml/m LA Biplane Vol: 132.0 ml 54.61 ml/m  AORTIC VALVE                    PULMONIC VALVE AV Area (Vmax):    3.19 cm     PV Vmax:       0.63 m/s AV Area (Vmean):   3.11 cm     PV Peak grad:  1.6 mmHg AV Area (VTI):     3.22 cm AV Vmax:           136.00 cm/s AV Vmean:          89.800 cm/s AV VTI:            0.268 m AV Peak Grad:      7.4 mmHg AV Mean Grad:      4.0 mmHg LVOT Vmax:         114.00 cm/s LVOT Vmean:        73.500 cm/s LVOT VTI:  0.227 m LVOT/AV VTI ratio: 0.85  AORTA Ao Root diam: 2.90 cm MITRAL VALVE                TRICUSPID VALVE MV Area (PHT): 3.83 cm     TR Peak grad:   53.0 mmHg MV Area VTI:   2.47 cm     TR Vmax:        364.00 cm/s MV Peak grad:  10.0 mmHg MV Mean grad:  3.0 mmHg     SHUNTS MV Vmax:       1.58 m/s     Systemic VTI:  0.23 m MV Vmean:      78.9 cm/s    Systemic Diam: 2.20 cm MV Decel Time: 198 msec MV E velocity: 130.00 cm/s MV A velocity: 60.40 cm/s MV E/A ratio:  2.15 Carlyle Dolly MD Electronically signed by Carlyle Dolly MD Signature Date/Time: 06/05/2021/12:14:52 PM    Final       Subjective: Imp[roving, starting to feel a little better, throat still sore from ET tube  Objective: Vitals:   06/15/21 0500 06/15/21 0800 06/15/21 0810 06/15/21 0900  BP:  (!) 149/66  (!) 149/70  Pulse:  69  67  Resp:  (!) 23  (!) 24  Temp:   98.1 F (36.7 C)   TempSrc:   Oral    SpO2:  96%  93%  Weight: 123.8 kg     Height:        Intake/Output Summary (Last 24 hours) at 06/15/2021 1147 Last data filed at 06/15/2021 1100 Gross per 24 hour  Intake 450.03 ml  Output 3650 ml  Net -3199.97 ml   Filed Weights   06/13/21 0500 06/14/21 0435 06/15/21 0500  Weight: 132.4 kg 128.8 kg 123.8 kg    Examination:  General exam: Appears calm and comfortable  Respiratory system: Clear to auscultation. Respiratory effort normal. Cardiovascular system: S1 & S2 heard, RRR. No JVD, murmurs, rubs, gallops or clicks. No pedal edema. Gastrointestinal system: Abdomen is nondistended, soft and nontender. No organomegaly or masses felt. Normal bowel sounds heard. Central nervous system: Alert and oriented. No focal neurological deficits. Extremities: Symmetric 5 x 5 power. Skin: No rashes, lesions or ulcers Psychiatry: Judgement and insight appear normal. Mood & affect appropriate.     Data Reviewed: I have personally reviewed following labs and imaging studies  CBC: Recent Labs  Lab 06/12/21 1409 06/13/21 0249 06/14/21 0302 06/15/21 0300  WBC 9.2 7.4 6.7 8.6  NEUTROABS  --  5.2  --   --   HGB 8.9* 7.9* 8.5* 8.3*  HCT 32.4* 28.5* 31.1* 30.6*  MCV 72.0* 71.6* 71.7* 70.8*  PLT 261 264 247 416   Basic Metabolic Panel: Recent Labs  Lab 06/12/21 2011 06/13/21 0249 06/13/21 1027 06/13/21 1701 06/14/21 0302 06/14/21 1722 06/14/21 2227 06/15/21 1019  NA  --  138   < > 140 141 140 142 142  K  --  3.9   < > 4.5 3.8 4.0 3.8 3.7  CL  --  107   < > 105 104 104 103 103  CO2  --  25   < > '29 30 28 '$ 32 30  GLUCOSE  --  88   < > 83 79 100* 100* 109*  BUN  --  27*   < > 32* 31* 27* 25* 24*  CREATININE  --  0.81   < > 0.95 0.86 0.80 0.83 0.67  CALCIUM  --  8.9   < > 8.9  8.6* 8.9 9.2 9.5  MG 2.2 2.2  --  2.5* 2.3  --   --   --   PHOS 3.7 3.8  --  4.4 4.8*  --   --   --    < > = values in this interval not displayed.   GFR: Estimated Creatinine Clearance: 128.4 mL/min  (by C-G formula based on SCr of 0.67 mg/dL). Liver Function Tests: Recent Labs  Lab 06/12/21 1409 06/13/21 0249 06/14/21 2227  AST '22 18 26  '$ ALT '17 17 23  '$ ALKPHOS 38 36* 43  BILITOT 0.4 0.5 0.7  PROT 7.2 6.7 7.7  ALBUMIN 3.0* 2.8* 3.1*   No results for input(s): LIPASE, AMYLASE in the last 168 hours. No results for input(s): AMMONIA in the last 168 hours. Coagulation Profile: Recent Labs  Lab 06/12/21 2011 06/13/21 0249 06/14/21 0302 06/15/21 0300  INR 3.3* 3.2* 2.4* 1.5*   Cardiac Enzymes: No results for input(s): CKTOTAL, CKMB, CKMBINDEX, TROPONINI in the last 168 hours. BNP (last 3 results) No results for input(s): PROBNP in the last 8760 hours. HbA1C: No results for input(s): HGBA1C in the last 72 hours. CBG: Recent Labs  Lab 06/13/21 2008 06/13/21 2338 06/14/21 0344 06/14/21 0813 06/14/21 1152  GLUCAP 84 109* 99 92 133*   Lipid Profile: Recent Labs    06/13/21 0249  TRIG 128   Thyroid Function Tests: No results for input(s): TSH, T4TOTAL, FREET4, T3FREE, THYROIDAB in the last 72 hours. Anemia Panel: No results for input(s): VITAMINB12, FOLATE, FERRITIN, TIBC, IRON, RETICCTPCT in the last 72 hours. Sepsis Labs: Recent Labs  Lab 06/12/21 1409  PROCALCITON 0.19  LATICACIDVEN 1.7    Recent Results (from the past 240 hour(s))  MRSA Next Gen by PCR, Nasal     Status: None   Collection Time: 06/12/21  1:11 PM   Specimen: Nasal Mucosa; Nasal Swab  Result Value Ref Range Status   MRSA by PCR Next Gen NOT DETECTED NOT DETECTED Final    Comment: (NOTE) The GeneXpert MRSA Assay (FDA approved for NASAL specimens only), is one component of a comprehensive MRSA colonization surveillance program. It is not intended to diagnose MRSA infection nor to guide or monitor treatment for MRSA infections. Test performance is not FDA approved in patients less than 48 years old. Performed at [facility], [place] [address] [place], [place] [address]    Respiratory (~20 pathogens) panel by PCR     Status: None   Collection Time: 06/12/21  2:37 PM   Specimen: Nasopharyngeal Swab; Respiratory  Result Value Ref Range Status   Adenovirus NOT DETECTED NOT DETECTED Final   Coronavirus 229E NOT DETECTED NOT DETECTED Final    Comment: (NOTE) The Coronavirus on the Respiratory Panel, DOES NOT test for the novel  Coronavirus (2019 nCoV)    Coronavirus HKU1 NOT DETECTED NOT DETECTED Final   Coronavirus NL63 NOT DETECTED NOT DETECTED Final   Coronavirus OC43 NOT DETECTED NOT DETECTED Final   Metapneumovirus NOT DETECTED NOT DETECTED Final   Rhinovirus / Enterovirus NOT DETECTED NOT DETECTED Final   Influenza A NOT DETECTED NOT DETECTED Final   Influenza B NOT DETECTED NOT DETECTED Final   Parainfluenza Virus 1 NOT DETECTED NOT DETECTED Final   Parainfluenza Virus 2 NOT DETECTED NOT DETECTED Final   Parainfluenza Virus 3 NOT DETECTED NOT DETECTED Final   Parainfluenza Virus 4 NOT DETECTED NOT DETECTED Final   Respiratory Syncytial Virus NOT DETECTED NOT DETECTED Final   Bordetella pertussis NOT DETECTED  NOT DETECTED Final   Bordetella Parapertussis NOT DETECTED NOT DETECTED Final   Chlamydophila pneumoniae NOT DETECTED NOT DETECTED Final   Mycoplasma pneumoniae NOT DETECTED NOT DETECTED Final    Comment: Performed at [facility], [place] [address], [place], [place] [address]  Culture, Respiratory w Gram Stain     Status: None (Preliminary result)   Collection Time: 06/12/21  3:23 PM   Specimen: Tracheal Aspirate; Respiratory  Result Value Ref Range Status   Specimen Description   Final    TRACHEAL ASPIRATE Performed at [facility] [address] [name], [place] [address]    Special Requests   Final    NONE Performed at [facility], [place] [address], [place], [place] [address]    Gram Stain   Final    FEW WBC PRESENT, PREDOMINANTLY PMN NO ORGANISMS SEEN    Culture   Final    CULTURE  REINCUBATED FOR BETTER GROWTH Performed at [facility], [place] [address], [place], [place] [address]    Report Status PENDING  Incomplete         Radiology Studies: DG Chest Port 1 View  Result Date: 06/15/2021 CLINICAL DATA:  Pulmonary edema. EXAM: PORTABLE CHEST 1 VIEW COMPARISON:  Jun 14, 2021. FINDINGS: Stable cardiomediastinal silhouette. Bilateral perihilar and basilar opacities are noted concerning for pulmonary edema or possibly infiltrate. Bony thorax is unremarkable. Endotracheal and nasogastric tubes have been removed. IMPRESSION: Bilateral perihilar and basilar opacities are noted concerning for pulmonary edema or possibly infiltrates. Electronically Signed   By: Marijo Conception M.D.   On: 06/15/2021 08:36   DG Chest Port 1 View  Result Date: 06/14/2021 CLINICAL DATA:  A 46 year old female presents for evaluation of pneumonia. EXAM: PORTABLE CHEST 1 VIEW COMPARISON:  Jun 13, 2021. FINDINGS: EKG leads project over the chest. Endotracheal tube approximately 5.5 cm from the carina, tip between clavicular heads. Previously approximately 3 cm from the carina. Gastric tube courses through in off the field of the radiograph. Appearance of the chest is stable otherwise when compared to the previous exam with graded opacity in the LEFT and RIGHT chest and obscured LEFT and RIGHT hemidiaphragms with increased interstitial markings. On limited assessment there is no acute skeletal finding. IMPRESSION: 1. Endotracheal tube approximately 5.5 cm from the carina, tip between clavicular heads. This may have been retracted slightly since previous imaging. Attention on follow-up, could consider approximately 1 cm advancement as warranted for more optimal placement. 2. Stable appearance of interstitial prominence, basilar airspace disease and bilateral effusions. These results will be called to the ordering clinician or representative by the Radiologist Assistant, and communication documented in the  PACS or [employer]. Electronically Signed   By: Zetta Bills M.D.   On: 06/14/2021 08:07        Scheduled Meds:  Chlorhexidine Gluconate Cloth  6 each Topical Q0600   furosemide  80 mg Intravenous BID   mouth rinse  15 mL Mouth Rinse BID   Warfarin - Pharmacist Dosing Inpatient   Does not apply q1600   Continuous Infusions:  azithromycin Stopped (06/14/21 1724)   cefTRIAXone (ROCEPHIN)  IV Stopped (06/15/21 1050)   heparin 1,750 Units/hr (06/15/21 1128)     LOS: 3 days    Time spent: 22 min    Nicolette Bang, MD [facility]  If 7PM-7AM, please contact night-coverage  06/15/2021, 11:47 AM

## 2021-06-15 NOTE — Progress Notes (Signed)
Nutrition Follow-up  DOCUMENTATION CODES:   Obesity unspecified  INTERVENTION:  - will order Hormel Shake once/day, each supplement provides 500 kcal and 20 grams protein.   NUTRITION DIAGNOSIS:   Swallowing difficulty related to dysphagia as evidenced by other (comment) (need for dysphagia 3, nectar-thick liquids diet). -revised, ongoing  GOAL:   Patient will meet greater than or equal to 90% of their needs -unable to meet at this time with recent diet advancement  MONITOR:   PO intake, Supplement acceptance, Diet advancement, Labs, Weight trends  ASSESSMENT:   46 y.o. female with medical history of CHF, OSA on CPAP, history of DVT/PE on coumadin, HTN, and iron deficiency anemia. She presented to [facility] ED due to worsening shortness of breath. CXR showed pulmonary edema vs PNA and she ultimately required intubation. She was then transferred to [facility] ICU.  Patient was extubated yesterday morning and OGT removed at that time. Estimated needs updated. Patient discussed in rounds this AM. SLP worked with patient this AM and diet advanced from NPO to Dysphagia 3, nectar-thick liquids.  Patient sitting up in bed. Voice slightly hoarse. She denies esophageal pain with swallowing and shares that she has not had any experiences in the past of difficulty swallowing. She has been sipping on thickened orange juice. She shares that she had a small amount each of orange juice, graham cracker, scrambled egg, blueberry muffin, and sausage.   Weight on 5/24 was 292 lb, weight yesterday was 284 lb, and weight today is 273 lb.      Labs reviewed; BUN: 24 mg/dl. Medications reviewed; 80 mg IV lasix BID.    Diet Order:   Diet Order             DIET DYS 3 Room service appropriate? Yes; Fluid consistency: Nectar Thick  Diet effective now                   EDUCATION NEEDS:   No education needs have been identified at this time  Skin:  Skin Assessment: Reviewed RN  Assessment  Last BM:  PTA/unknown  Height:   Ht Readings from Last 1 Encounters:  06/13/21 '5\' 10"'$  (1.778 m)    Weight:   Wt Readings from Last 1 Encounters:  06/15/21 123.8 kg     BMI:  Body mass index is 39.16 kg/m.  Estimated Nutritional Needs:  Kcal:  2000-2200 kcal Protein:  100-115 grams Fluid:  >/= 2 L/day     Jarome Matin, MS, RD, LDN Registered Dietitian II Inpatient Clinical Nutrition RD pager # and on-call/weekend pager # available in [facility]

## 2021-06-15 NOTE — Evaluation (Signed)
Clinical/Bedside Swallow Evaluation Patient Details  Name: [name] [name] MRN: [id number] Date of Birth: [birth year]  Today's Date: 06/15/2021 Time: SLP Start Time (ACUTE ONLY): 0910 SLP Stop Time (ACUTE ONLY): 0940 SLP Time Calculation (min) (ACUTE ONLY): 30 min  Past Medical History:  Past Medical History:  Diagnosis Date   Abdominal cramps 08/30/2015   Anemia    Blood transfusion without reported diagnosis    Closed left ankle fracture    August 30 2012   Clotting disorder [facility])    DVT (deep venous thrombosis) (HCC)    L leg   Fibroid tumor    Hidradenitis    High blood pressure    Hypertension    Obesity    OSA on CPAP    Oxygen deficiency    Pregnancy    09/20/14 17 weeks   Pulmonary embolism ([place]) 04/2013   Sleep apnea    Tobacco use disorder    Past Surgical History:  Past Surgical History:  Procedure Laterality Date   ABSCESS DRAINAGE Bilateral 01/10/15   COLONOSCOPY WITH PROPOFOL N/A 06/08/2018   Procedure: COLONOSCOPY WITH PROPOFOL;  Surgeon: Daneil Dolin, MD;  Location: [facility];  Service: Endoscopy;  Laterality: N/A;  2:30pm   CYST REMOVAL TRUNK     IVC FILTER PLACEMENT ([place] HX)     LOWER EXTREMITY VENOGRAPHY N/A 02/07/2021   Procedure: LOWER EXTREMITY VENOGRAPHY;  Surgeon: [name], MD;  Location: [place] CV LAB;  Service: Cardiovascular;  Laterality: N/A;   PERIPHERAL VASCULAR THROMBECTOMY N/A 02/07/2021   Procedure: PERIPHERAL VASCULAR THROMBECTOMY;  Surgeon: [name], MD;  Location: [place] CV LAB;  Service: Cardiovascular;  Laterality: N/A;   HPI:  46yo female admitted 06/12/21 with progressive dyspnea. Pt intubated in ED. Extubated 06/14/21. PMH: CHF, OSA on CPAP, DVT/PE, HTN, iron deficiency anemia, obesity. CXR = Bilateral perihilar and basilar opacities are noted concerning for pulmonary edema or possibly infiltrates.    Assessment / Plan / Recommendation  Clinical Impression  Pt seen at bedside for assessment of swallow  function and safety, and identification of least restrictive diet in the setting of 2-day intubation. Pt was extubated yesterday, and chart review indicates she exhibited difficulty with thin liquids post-extubation. Pt completed oral care after set up. CN exam is [facility]. She has natural dentition. Volitional cough is fair. Voice quality is clear, but low in intensity. Following oral care, pt accepted trials of ice chips, puree, solids, and nectar thick liquids. Immediate cough response was elicited after the swallow of water from ice chips. Laryngeal elevation appreciated to palpation. Trials of nectar thick liquid via cup and straw, puree, and solid textures were tolerated without obvious oral issues, anterior leakage or residue, and no overt s/s aspiration. At this time, recommend soft solids (primarily for energy conservation) and nectar thick liquids. Meds whole with either nectar thick liquids or puree, per pt preference. Safe swallow precautions were reviewed with pt and posted at [facility]. SLP will follow to assess diet tolerance, determine readiness to advance textures, and identify if instrumental assessment is needed. RN and MD informed.  SLP Visit Diagnosis: Dysphagia, unspecified (R13.10)    Aspiration Risk  Mild aspiration risk    Diet Recommendation Dysphagia 3 (Mech soft);Nectar-thick liquid   Liquid Administration via: Cup;Straw Medication Administration: Other (Comment) (whole meds with nectar thick liquids or puree) Supervision: Patient able to self feed;Intermittent supervision to cue for compensatory strategies Compensations: Slow rate;Small sips/bites Postural Changes: Seated upright at 90 degrees;Remain upright for  at least 30 minutes after po intake    Other  Recommendations Oral Care Recommendations: Oral care BID Other Recommendations: Order thickener from pharmacy;Have oral suction available    Recommendations for follow up therapy are one component of a multi-disciplinary  discharge planning process, led by the attending physician.  Recommendations may be updated based on patient status, additional functional criteria and insurance authorization.  Follow up Recommendations Other (comment) (TBD)      Assistance Recommended at Discharge None  Functional Status Assessment Patient has had a recent decline in their functional status and demonstrates the ability to make significant improvements in function in a reasonable and predictable amount of time.  Frequency and Duration min 1 x/week  1 week;2 weeks       Prognosis Prognosis for Safe Diet Advancement: Good      Swallow Study   General Date of Onset: 06/12/21 HPI: 46yo female admitted 06/12/21 with progressive dyspnea. Pt intubated in ED. Extubated 06/14/21. PMH: CHF, OSA on CPAP, DVT/PE, HTN, iron deficiency anemia, obesity. CXR = Bilateral perihilar and basilar opacities are noted concerning for pulmonary edema or possibly infiltrates. Type of Study: Bedside Swallow Evaluation Previous Swallow Assessment: none found Diet Prior to this Study: NPO Temperature Spikes Noted: No Respiratory Status: Nasal cannula History of Recent Intubation: Yes Length of Intubations (days): 2 days Date extubated: 06/14/21 Behavior/Cognition: Alert;Cooperative;Pleasant mood Oral Cavity Assessment: Within Functional Limits Oral Care Completed by SLP: Yes Oral Cavity - Dentition: Adequate natural dentition Vision: Functional for self-feeding Self-Feeding Abilities: Able to feed self Patient Positioning: Upright in bed Baseline Vocal Quality: Low vocal intensity Volitional Cough: Other (Comment) (fair) Volitional Swallow: Able to elicit    Oral/Motor/Sensory Function Overall Oral Motor/Sensory Function: Within functional limits   Ice Chips Ice chips: Impaired Presentation: Spoon Pharyngeal Phase Impairments: Cough - Immediate   Thin Liquid Thin Liquid: Not tested    Nectar Thick Nectar Thick Liquid: Within  functional limits Presentation: Cup;Self Fed;Straw   Honey Thick Honey Thick Liquid: Not tested   Puree Puree: Within functional limits Presentation: Self Fed;Spoon   Solid     Solid: Within functional limits Presentation: Olivarez B. Quentin Ore, [facility], [place] Speech Language Pathologist Office: [phone]  Shonna Chock 06/15/2021,9:56 AM

## 2021-06-15 NOTE — Progress Notes (Signed)
Progress Note  Patient Name: [name] [name] Date of Encounter: 06/15/2021  [facility] Cardiologist: Carlyle Dolly, MD   Subjective   Still on 4 L per min O2. Denies any CP. SOB improving. Still coughing. Had some swallowing issue yesterday, no problem swallowing apple sauce this morning. Wonders when the foley can be discontinued.   Inpatient Medications    Scheduled Meds:  Chlorhexidine Gluconate Cloth  6 each Topical Q0600   mouth rinse  15 mL Mouth Rinse BID   Warfarin - Pharmacist Dosing Inpatient   Does not apply q1600   Continuous Infusions:  azithromycin Stopped (06/14/21 1724)   cefTRIAXone (ROCEPHIN)  IV Stopped (06/14/21 1132)   PRN Meds: ondansetron (ZOFRAN) IV   Vital Signs    Vitals:   06/15/21 0300 06/15/21 0400 06/15/21 0500 06/15/21 0810  BP: (!) 143/76 132/69    Pulse: 75 71    Resp: (!) 22 (!) 25    Temp:  98.2 F (36.8 C)  98.1 F (36.7 C)  TempSrc:    Oral  SpO2: 98% 98%    Weight:   123.8 kg   Height:        Intake/Output Summary (Last 24 hours) at 06/15/2021 0945 Last data filed at 06/15/2021 3810 Gross per 24 hour  Intake 368.94 ml  Output 6200 ml  Net -5831.06 ml      06/15/2021    5:00 AM 06/14/2021    4:35 AM 06/13/2021    5:00 AM  Last 3 Weights  Weight (lbs) 272 lb 14.9 oz 283 lb 15.2 oz 291 lb 14.2 oz  Weight (kg) 123.8 kg 128.8 kg 132.4 kg      Telemetry    NSR with HR 60-90s - Personally Reviewed  ECG    NSR with LAFB - Personally Reviewed  Physical Exam   GEN: No acute distress.   Neck: No JVD Cardiac: RRR, no murmurs, rubs, or gallops.  Respiratory: Clear to auscultation bilaterally. GI: Soft, nontender, non-distended  MS: No edema; No deformity. Neuro:  Nonfocal  Psych: Normal affect   Labs    High Sensitivity Troponin:   Recent Labs  Lab 06/04/21 2321 06/12/21 1409  TROPONINIHS 13 8     Chemistry Recent Labs  Lab 06/12/21 1409 06/12/21 2011 06/13/21 0249 06/13/21 1027 06/13/21 1701  06/14/21 0302 06/14/21 1722 06/14/21 2227  NA 135  --  138   < > 140 141 140 142  K 4.1  --  3.9   < > 4.5 3.8 4.0 3.8  CL 104  --  107   < > 105 104 104 103  CO2 23  --  25   < > '29 30 28 '$ 32  GLUCOSE 82  --  88   < > 83 79 100* 100*  BUN 23*  --  27*   < > 32* 31* 27* 25*  CREATININE 0.95  --  0.81   < > 0.95 0.86 0.80 0.83  CALCIUM 8.8*  --  8.9   < > 8.9 8.6* 8.9 9.2  MG  --    < > 2.2  --  2.5* 2.3  --   --   PROT 7.2  --  6.7  --   --   --   --  7.7  ALBUMIN 3.0*  --  2.8*  --   --   --   --  3.1*  AST 22  --  18  --   --   --   --  26  ALT 17  --  17  --   --   --   --  23  ALKPHOS 38  --  36*  --   --   --   --  43  BILITOT 0.4  --  0.5  --   --   --   --  0.7  GFRNONAA >60  --  >60   < > >60 >60 >60 >60  ANIONGAP 8  --  6   < > '6 7 8 7   '$ < > = values in this interval not displayed.    Lipids  Recent Labs  Lab 06/13/21 0249  TRIG 128    Hematology Recent Labs  Lab 06/13/21 0249 06/14/21 0302 06/15/21 0300  WBC 7.4 6.7 8.6  RBC 3.98 4.34 4.32  HGB 7.9* 8.5* 8.3*  HCT 28.5* 31.1* 30.6*  MCV 71.6* 71.7* 70.8*  MCH 19.8* 19.6* 19.2*  MCHC 27.7* 27.3* 27.1*  RDW 26.3* 26.3* 26.5*  PLT 264 247 272   Thyroid No results for input(s): TSH, FREET4 in the last 168 hours.  BNP Recent Labs  Lab 06/12/21 1409  BNP 293.6*    DDimer No results for input(s): DDIMER in the last 168 hours.   Radiology    DG Chest Port 1 View  Result Date: 06/15/2021 CLINICAL DATA:  Pulmonary edema. EXAM: PORTABLE CHEST 1 VIEW COMPARISON:  Jun 14, 2021. FINDINGS: Stable cardiomediastinal silhouette. Bilateral perihilar and basilar opacities are noted concerning for pulmonary edema or possibly infiltrate. Bony thorax is unremarkable. Endotracheal and nasogastric tubes have been removed. IMPRESSION: Bilateral perihilar and basilar opacities are noted concerning for pulmonary edema or possibly infiltrates. Electronically Signed   By: Marijo Conception M.D.   On: 06/15/2021 08:36   DG Chest  Port 1 View  Result Date: 06/14/2021 CLINICAL DATA:  A 46 year old female presents for evaluation of pneumonia. EXAM: PORTABLE CHEST 1 VIEW COMPARISON:  Jun 13, 2021. FINDINGS: EKG leads project over the chest. Endotracheal tube approximately 5.5 cm from the carina, tip between clavicular heads. Previously approximately 3 cm from the carina. Gastric tube courses through in off the field of the radiograph. Appearance of the chest is stable otherwise when compared to the previous exam with graded opacity in the LEFT and RIGHT chest and obscured LEFT and RIGHT hemidiaphragms with increased interstitial markings. On limited assessment there is no acute skeletal finding. IMPRESSION: 1. Endotracheal tube approximately 5.5 cm from the carina, tip between clavicular heads. This may have been retracted slightly since previous imaging. Attention on follow-up, could consider approximately 1 cm advancement as warranted for more optimal placement. 2. Stable appearance of interstitial prominence, basilar airspace disease and bilateral effusions. These results will be called to the ordering clinician or representative by the Radiologist Assistant, and communication documented in the PACS or [employer]. Electronically Signed   By: Zetta Bills M.D.   On: 06/14/2021 08:07   DG Chest Port 1 View  Result Date: 06/13/2021 CLINICAL DATA:  Pneumonia follow-up EXAM: PORTABLE CHEST 1 VIEW COMPARISON:  06/12/2021 FINDINGS: Endotracheal and enteric tubes are again identified. Similar cardiomegaly. Patchy pulmonary opacities remain present. Aeration is slightly improved. No significant pleural effusions. IMPRESSION: Persistent patchy bilateral pulmonary opacities with some improvement in aeration since 06/12/2021. Electronically Signed   By: Macy Mis M.D.   On: 06/13/2021 11:08    Cardiac Studies   Echo 06/05/2021 1. Left ventricular ejection fraction, by estimation, is 45 to 50%. The  left ventricle  has mildly  decreased function. The left ventricle  demonstrates global hypokinesis. The left ventricular internal cavity size  was moderately dilated. There is mild  left ventricular hypertrophy. Left ventricular diastolic parameters are  indeterminate.   2. Right ventricular systolic function is normal. The right ventricular  size is normal. There is severely elevated pulmonary artery systolic  pressure.   3. Left atrial size was severely dilated.   4. Right atrial size was mildly dilated.   5. The mitral valve is abnormal. Mild mitral valve regurgitation. No  evidence of mitral stenosis.   6. The tricuspid valve is abnormal.   7. The aortic valve is tricuspid. Aortic valve regurgitation is not  visualized. No aortic stenosis is present.   8. The inferior vena cava is normal in size with greater than 50%  respiratory variability, suggesting right atrial pressure of 3 mmHg.   Patient Profile     46 y.o. 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 Erin Fulling.  Assessment & Plan    Acute on chronic combined systolic and diastolic heart failure             -Recent echocardiogram shows EF mildly down to 45 to 50%, echocardiogram obtained in the setting of community-acquired pneumonia.              -obtain BMET. CXR this morning continue to show possible pulm edema. Resume IV lasix '80mg'$  BID, likely decrease tomorrow. I/O -9.7L. Still requires 4L per min O2. I suspect the patient is near euvolemic level, however will continue lasix as long as her renal function is stable. Dr. Johnsie Cancel recommended left and right heart cath, will tentatively put  her on cath board for Tue 10:30AM with Dr. Tamala Julian (cath is closed on Mon for Memorial day)   Hospital-acquired pneumonia: Receiving IV Rocephin and  azithromycin.   Acute respiratory failure requiring intubation: Likely related to mild heart failure and also pneumonia   Pulmonary hypertension: Echocardiogram in April 2015 demonstrated PA peak pressure 53 mmHg, however this was obtained in the setting of acute PE.  Echo in August 2019 did not mention pulmonary artery pressure.  Recent echocardiogram obtained a week ago demonstrated RV systolic pressure 61 mmHg, however this was obtained in the setting of community-acquired pneumonia.  She has a history of obstructive sleep apnea.  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

## 2021-06-16 DIAGNOSIS — I5023 Acute on chronic systolic (congestive) heart failure: Secondary | ICD-10-CM | POA: Diagnosis not present

## 2021-06-16 DIAGNOSIS — J9601 Acute respiratory failure with hypoxia: Secondary | ICD-10-CM | POA: Diagnosis not present

## 2021-06-16 DIAGNOSIS — Z86711 Personal history of pulmonary embolism: Secondary | ICD-10-CM | POA: Diagnosis not present

## 2021-06-16 LAB — CBC
HCT: 33.7 % — ABNORMAL LOW (ref 36.0–46.0)
Hemoglobin: 9.3 g/dL — ABNORMAL LOW (ref 12.0–15.0)
MCH: 19.9 pg — ABNORMAL LOW (ref 26.0–34.0)
MCHC: 27.6 g/dL — ABNORMAL LOW (ref 30.0–36.0)
MCV: 72 fL — ABNORMAL LOW (ref 80.0–100.0)
Platelets: 383 10*3/uL (ref 150–400)
RBC: 4.68 MIL/uL (ref 3.87–5.11)
RDW: 26.5 % — ABNORMAL HIGH (ref 11.5–15.5)
WBC: 9.9 10*3/uL (ref 4.0–10.5)
nRBC: 0 % (ref 0.0–0.2)

## 2021-06-16 LAB — HEPARIN LEVEL (UNFRACTIONATED)
Heparin Unfractionated: 0.38 IU/mL (ref 0.30–0.70)
Heparin Unfractionated: 0.25 IU/mL — ABNORMAL LOW (ref 0.30–0.70)
Heparin Unfractionated: 0.38 IU/mL (ref 0.30–0.70)

## 2021-06-16 LAB — PROTIME-INR
INR: 1.3 — ABNORMAL HIGH (ref 0.8–1.2)
Prothrombin Time: 16.2 seconds — ABNORMAL HIGH (ref 11.4–15.2)

## 2021-06-16 LAB — TRIGLYCERIDES: Triglycerides: 52 mg/dL (ref <150)

## 2021-06-16 MED ORDER — HEPARIN (PORCINE) 25000 UT/250ML-% IV SOLN
2250.0000 [IU]/h | INTRAVENOUS | Status: DC
  Administered 2021-06-17: 2050 [IU]/h via INTRAVENOUS
  Administered 2021-06-17 – 2021-06-19 (×4): 2250 [IU]/h via INTRAVENOUS
  Filled 2021-06-16 (×5): qty 250

## 2021-06-16 NOTE — Progress Notes (Signed)
Progress Note  Patient Name: [name] [name] Date of Encounter: 06/16/2021  [facility] Cardiologist: Carlyle Dolly, MD   Subjective   Sats still falling at times no chest pain Cheerful   Inpatient Medications    Scheduled Meds:  Chlorhexidine Gluconate Cloth  6 each Topical Q0600   furosemide  80 mg Intravenous BID   mouth rinse  15 mL Mouth Rinse BID   Continuous Infusions:  azithromycin Stopped (06/15/21 1757)   cefTRIAXone (ROCEPHIN)  IV Stopped (06/15/21 1050)   heparin 1,950 Units/hr (06/16/21 0800)   PRN Meds: ondansetron (ZOFRAN) IV   Vital Signs    Vitals:   06/16/21 0400 06/16/21 0500 06/16/21 0700 06/16/21 0800  BP: 134/76 140/90 (!) 156/64 (!) 159/84  Pulse: 72 65 69 77  Resp: (!) 23 (!) 23 (!) 22 16  Temp:    98 F (36.7 C)  TempSrc:    Axillary  SpO2: 97% 100% 99% 100%  Weight:  122.5 kg    Height:        Intake/Output Summary (Last 24 hours) at 06/16/2021 0950 Last data filed at 06/16/2021 0900 Gross per 24 hour  Intake 1002.08 ml  Output 800 ml  Net 202.08 ml      06/16/2021    5:00 AM 06/15/2021    5:00 AM 06/14/2021    4:35 AM  Last 3 Weights  Weight (lbs) 270 lb 1 oz 272 lb 14.9 oz 283 lb 15.2 oz  Weight (kg) 122.5 kg 123.8 kg 128.8 kg      Telemetry    NSR with HR 60-90s - Personally Reviewed  ECG    NSR with LAFB - Personally Reviewed  Physical Exam   GEN: No acute distress.   Neck: No JVD Cardiac: RRR, no murmurs, rubs, or gallops.  Respiratory: Clear to auscultation bilaterally. GI: Soft, nontender, non-distended  MS: No edema; No deformity. Neuro:  Nonfocal  Psych: Normal affect   Labs    High Sensitivity Troponin:   Recent Labs  Lab 06/04/21 2321 06/12/21 1409  TROPONINIHS 13 8     Chemistry Recent Labs  Lab 06/12/21 1409 06/12/21 2011 06/13/21 0249 06/13/21 1027 06/13/21 1701 06/14/21 0302 06/14/21 1722 06/14/21 2227 06/15/21 1019  NA 135  --  138   < > 140 141 140 142 142  K 4.1  --  3.9    < > 4.5 3.8 4.0 3.8 3.7  CL 104  --  107   < > 105 104 104 103 103  CO2 23  --  25   < > '29 30 28 '$ 32 30  GLUCOSE 82  --  88   < > 83 79 100* 100* 109*  BUN 23*  --  27*   < > 32* 31* 27* 25* 24*  CREATININE 0.95  --  0.81   < > 0.95 0.86 0.80 0.83 0.67  CALCIUM 8.8*  --  8.9   < > 8.9 8.6* 8.9 9.2 9.5  MG  --    < > 2.2  --  2.5* 2.3  --   --   --   PROT 7.2  --  6.7  --   --   --   --  7.7  --   ALBUMIN 3.0*  --  2.8*  --   --   --   --  3.1*  --   AST 22  --  18  --   --   --   --  26  --   ALT 17  --  17  --   --   --   --  23  --   ALKPHOS 38  --  36*  --   --   --   --  43  --   BILITOT 0.4  --  0.5  --   --   --   --  0.7  --   GFRNONAA >60  --  >60   < > >60 >60 >60 >60 >60  ANIONGAP 8  --  6   < > '6 7 8 7 9   '$ < > = values in this interval not displayed.    Lipids  Recent Labs  Lab 06/16/21 0531  TRIG 52    Hematology Recent Labs  Lab 06/14/21 0302 06/15/21 0300 06/16/21 0531  WBC 6.7 8.6 9.9  RBC 4.34 4.32 4.68  HGB 8.5* 8.3* 9.3*  HCT 31.1* 30.6* 33.7*  MCV 71.7* 70.8* 72.0*  MCH 19.6* 19.2* 19.9*  MCHC 27.3* 27.1* 27.6*  RDW 26.3* 26.5* 26.5*  PLT 247 272 383   Thyroid No results for input(s): TSH, FREET4 in the last 168 hours.  BNP Recent Labs  Lab 06/12/21 1409  BNP 293.6*    DDimer No results for input(s): DDIMER in the last 168 hours.   Radiology    DG Chest Port 1 View  Result Date: 06/15/2021 CLINICAL DATA:  Pulmonary edema. EXAM: PORTABLE CHEST 1 VIEW COMPARISON:  Jun 14, 2021. FINDINGS: Stable cardiomediastinal silhouette. Bilateral perihilar and basilar opacities are noted concerning for pulmonary edema or possibly infiltrate. Bony thorax is unremarkable. Endotracheal and nasogastric tubes have been removed. IMPRESSION: Bilateral perihilar and basilar opacities are noted concerning for pulmonary edema or possibly infiltrates. Electronically Signed   By: Marijo Conception M.D.   On: 06/15/2021 08:36    Cardiac Studies   Echo 06/05/2021 1. Left  ventricular ejection fraction, by estimation, is 45 to 50%. The  left ventricle has mildly decreased function. The left ventricle  demonstrates global hypokinesis. The left ventricular internal cavity size  was moderately dilated. There is mild  left ventricular hypertrophy. Left ventricular diastolic parameters are  indeterminate.   2. Right ventricular systolic function is normal. The right ventricular  size is normal. There is severely elevated pulmonary artery systolic  pressure.   3. Left atrial size was severely dilated.   4. Right atrial size was mildly dilated.   5. The mitral valve is abnormal. Mild mitral valve regurgitation. No  evidence of mitral stenosis.   6. The tricuspid valve is abnormal.   7. The aortic valve is tricuspid. Aortic valve regurgitation is not  visualized. No aortic stenosis is present.   8. The inferior vena cava is normal in size with greater than 50%  respiratory variability, suggesting right atrial pressure of 3 mmHg.   Patient Profile     46 y.o. 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 Erin Fulling.  Assessment & Plan    Acute on chronic combined systolic and diastolic heart failure             -Recent echocardiogram shows EF mildly down to 45 to 50%, echocardiogram obtained in the setting of community-acquired pneumonia.              -  right and left cath Tuesday ( Monday is Holiday ) Continue with iv lasix for diuresis    Hospital-acquired pneumonia: Receiving IV Rocephin and azithromycin.   Acute respiratory failure requiring intubation: Likely related to mild heart failure and also pneumonia   Pulmonary hypertension: Echocardiogram in April 2015 demonstrated PA peak pressure 53 mmHg, however this was obtained in the setting of  acute PE.  Echo in August 2019 did not mention pulmonary artery pressure.  Recent echocardiogram obtained a week ago demonstrated RV systolic pressure 61 mmHg, however this was obtained in the setting of community-acquired pneumonia.  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

## 2021-06-16 NOTE — Progress Notes (Signed)
Speech Language Pathology Treatment: Dysphagia  Patient Details Name: [name] [name] MRN: [id number] DOB: 1975/02/18 Today's Date: 06/16/2021 Time: 0932-3557 SLP Time Calculation (min) (ACUTE ONLY): 15 min  Assessment / Plan / Recommendation Clinical Impression  RN requested pt be reevalated per pt request.  Pt alert, sitting upright in bed - able to feed herself.  Voice is clear but not as strong as baseline - pt endorses improvement.  Conducted 3 ounce [place] water screen which pt easily passed.  Observed pt consuming ice chips, pears, nectar juice and thin water with no evidence of dysphagia nor airway compromise.  Suspect near full resolution of pt's acute dysphagia due to her clerance of pharyngea/laryngeal edema.  Advanced diet to regular/thin and reviewed importance of pt using caution with intake. Will follow up x1 to assure tolerance of po given vocal intensity not at baseline and post-extubation acute dysphagia,    HPI HPI: 46yo female admitted 06/12/21 with progressive dyspnea. Pt intubated in ED emergency. Extubated 06/14/21. PMH: CHF, OSA on CPAP, DVT/PE, HTN, iron deficiency anemia, obesity. CXR = Bilateral perihilar and basilar opacities are noted concerning for pulmonary edema or possibly infiltrates.      SLP Plan  Continue with current plan of care      Recommendations for follow up therapy are one component of a multi-disciplinary discharge planning process, led by the attending physician.  Recommendations may be updated based on patient status, additional functional criteria and insurance authorization.    Recommendations  Diet recommendations: Regular;Thin liquid Liquids provided via: Cup;Straw Medication Administration: Other (Comment) (as tolerated) Supervision: Patient able to self feed Compensations: Slow rate;Small sips/bites Postural Changes and/or Swallow Maneuvers: Seated upright 90 degrees;Out of bed for meals;Upright 30-60 min after meal                 Oral Care Recommendations: Oral care BID Follow Up Recommendations: No SLP follow up Assistance recommended at discharge: None SLP Visit Diagnosis: Dysphagia, unspecified (R13.10) Plan: Continue with current plan of care         Kathleen Lime, MS [place] Office [phone] Pager [phone]   Macario Golds  06/16/2021, 7:01 PM

## 2021-06-16 NOTE — Progress Notes (Signed)
PROGRESS NOTE    [name] [name]  WUJ:[id number] DOB: 08-19-1975 DOA: 06/12/2021 PCP: Carrolyn Meiers, MD   Brief Narrative:  [name] [name] is a 46 year old female with past medical history of hypertension, history of DVT/PE on lifelong anticoagulation, pulmonary hypertension, OSA on CPAP, history of anemia secondary to menorrhagia/leiomyoma.  Patient had a unprovoked saddle PE in 2015 and was placed on Coumadin therapy since.  Repeat CT showed resolution of the prior PE.  Echocardiogram in August 2019 showed EF 60 to 65%, no regional wall motion abnormality, normal RV.  Patient was last seen by Levell July, NP on 10/19/2020 at which time she was doing well.  She had lost significant amount of weight.  Unfortunately she had unprovoked DVT in January 2023 and was seen by Dr. Unk Lightning of vascular surgery.  Patient reportedly missed 2 doses of Coumadin prior to her DVT.  CTA was negative for PE, but does reveal groundglass opacity in the mid and lower lobe left greater than right which could represent either edema versus pneumonia/pneumonitis.  She ultimately percutaneous mechanical suction thrombectomy by Dr. Gwenlyn Saran of vascular surgery.  More recently, patient was admitted from 5/16 - 5/18 due to community-acquired pneumonia and acute on chronic diastolic heart failure.  Echocardiogram obtained on 06/05/2021 demonstrated EF 45 to 50%, global hypokinesis, mild LVH, severe LAE, mild MR.  She underwent IV diuresis.  Laboratory findings at the time showed a BNP of 537.  She was found to have high-output heart failure in the setting of significant anemia.  She received 3 units of packed red blood cell.  Patient was also treated with IV Rocephin, azithromycin and Omnicef.  Discharge weight was 127.4 kg.  After her discharge, she initially felt well, however symptoms recurred 3 days later on 06/10/2021.  She was having significant nonproductive cough, orthopnea and PND symptom.  She says she was compliant  with her diuretic therapy at home.   Due to worsening shortness of breath, patient ultimately presented to [facility] ED.  She required intubation in the emergency room.  Chest x-ray showed pulmonary edema and possible pneumonia.  COVID test is negative.  She was placed on ventilator and transferred to [facility].  Since admission, she has been treated with IV Lasix and antibiotics.  Patient was ultimately extubated on 06/14/2021 and oxygen requirement has weaned down to 4 L oxygen.  Cardiology service consulted for possible heart failure with plans for heart cath on Tuesday.   Assessment & Plan:   Principal Problem:   Acute respiratory failure with hypoxia (HCC) Active Problems:   Chronic pain syndrome   Aspiration pneumonia (HCC)   Acute on chronic diastolic CHF (congestive heart failure) (HCC)   OSA on CPAP   Tobacco use disorder   Severe Iron deficiency anemia   Morbid obesity ([place])   History of pulmonary embolism   Acute on chronic systolic congestive heart failure (HCC)   Pulmonary edema   Dysphagia  Acute on chronic combined systolic and diastolic heart failure -Recent echocardiogram shows EF mildly down to 45 to 50%, echocardiogram obtained in the setting of community-acquired pneumonia.          - -C/W IV lasix '80mg'$  BID,  -WEANING 02 -Dr. Johnsie Cancel recommended left and right heart cath, planned cath board for Tue 10:30AM with Dr. Tamala Julian (cath is closed on Mon for Memorial day) -now on heparin gtt   2 Hospital-acquired pneumonia: Receiving IV Rocephin and azithromycin.   3  Acute respiratory failure requiring  intubation: Likely related to mild heart failure and also pneumonia, now extubated doing well   4 Pulmonary hypertension: Echocardiogram in April 2015 demonstrated PA peak pressure 53 mmHg, however this was obtained in the setting of acute PE.  Echo in August 2019 did not mention pulmonary artery pressure.  Recent echocardiogram obtained a week ago demonstrated  RV systolic pressure 61 mmHg, however this was obtained in the setting of community-acquired pneumonia.  She has a history of obstructive sleep apnea.     5 Hypertension: c/w lasix   6 Recurrent DVT/PE: On lifelong Coumadin, holding currently, on hep gtt   7 History of anemia secondary to leiomyoma/menorrhagia: monitor cbc on hep gtt         DVT prophylaxis: On:hep gtt   CODE STATUS: FULL CODE     Code Status Orders  (From admission, onward)           Start     Ordered   06/12/21 1530  Full code  Continuous        06/12/21 1529           Code Status History     Date Active Date Inactive Code Status Order ID Comments User Context   06/05/2021 0422 06/07/2021 1810 Full Code [id number]  Mansy, Arvella Merles, MD Inpatient   02/24/2015 1405 02/26/2015 1529 Full Code [id number]  Smiley Houseman, MD Inpatient   02/23/2015 0716 02/24/2015 1405 Full Code [id number]  Myrtis Ser, CNM Inpatient   07/12/2013 1004 07/13/2013 0337 Full Code [id number]  Greggory Keen, MD HOV   04/29/2013 0957 04/30/2013 0336 Full Code [id number]  Greggory Keen, MD HOV   04/27/2013 2200 04/29/2013 0957 Full Code [id number]  Theressa Millard, MD Inpatient      Family Communication: DISCUSSED WITH PATIENT AT BEDSIDE  Disposition Plan: Patient requires continued inpatient treatment with planned heart cath on Tuesday. Consults called: None Admission status: Inpatient   Consultants:  CARDS  Procedures:  DG Chest 2 View  Result Date: 06/07/2021 CLINICAL DATA:  Cough EXAM: CHEST - 2 VIEW COMPARISON:  06/04/2021 FINDINGS: Stable heart size. Mild pulmonary vascular congestion with bilateral perihilar and bibasilar interstitial opacities. Slightly improving aeration within the left lung base compared to prior. No pleural effusion or pneumothorax. IMPRESSION: Persistent bilateral interstitial opacities with slightly improving aeration within the left lung base compared to prior. Electronically Signed   By: Davina Poke D.O.   On: 06/07/2021 08:24   DG Chest 2 View  Result Date: 06/04/2021 CLINICAL DATA:  Shortness of breath for 3 days with productive cough. Intermittent chest pain and lethargy. EXAM: CHEST - 2 VIEW COMPARISON:  Radiographs 04/27/2013. Chest CT 10/21/2013. Abdominal CT 01/30/2021. FINDINGS: The heart size and mediastinal contours are normal. There is vascular congestion with new interstitial opacities in both lungs, diffuse fissural thickening and patchy left lower lobe airspace disease. No pneumothorax or significant pleural effusion. The bones appear unchanged. There are degenerative changes throughout the thoracic spine. IMPRESSION: Vascular congestion with diffuse interstitial opacities, patchy left lower lobe airspace disease and diffuse interstitial thickening. Findings may be secondary to pulmonary edema or atypical infection. Electronically Signed   By: Richardean Sale M.D.   On: 06/04/2021 14:10   CT Angio Chest PE W and/or Wo Contrast  Result Date: 06/05/2021 CLINICAL DATA:  Pulmonary embolism (PE) suspected, high prob. Productive cough, shortness of breath, occasional chest pain EXAM: CT ANGIOGRAPHY CHEST WITH CONTRAST TECHNIQUE: Multidetector CT imaging of the chest was performed using the standard  protocol during bolus administration of intravenous contrast. Multiplanar CT image reconstructions and MIPs were obtained to evaluate the vascular anatomy. RADIATION DOSE REDUCTION: This exam was performed according to the departmental dose-optimization program which includes automated exposure control, adjustment of the mA and/or kV according to patient size and/or use of iterative reconstruction technique. CONTRAST:  176m OMNIPAQUE IOHEXOL 350 MG/ML SOLN COMPARISON:  10/21/2013 FINDINGS: Cardiovascular: Heart is normal size. Aorta is normal caliber. No filling defects in the pulmonary arteries to suggest pulmonary emboli. Mediastinum/Nodes: No mediastinal, hilar, or axillary adenopathy.  Trachea and esophagus are unremarkable. Thyroid unremarkable. Lungs/Pleura: Ground-glass opacities in the mid and lower lungs. This could reflect edema or atypical infection/pneumonitis. No effusions. Upper Abdomen: No acute findings Musculoskeletal: Chest wall soft tissues are unremarkable. No acute bony abnormality. Review of the MIP images confirms the above findings. IMPRESSION: No evidence of pulmonary embolus. Ground-glass opacities in the mid and lower lungs, left greater than right. This could reflect edema or pneumonia/pneumonitis. Electronically Signed   By: KRolm BaptiseM.D.   On: 06/05/2021 01:15   DG Chest Port 1 View  Result Date: 06/15/2021 CLINICAL DATA:  Pulmonary edema. EXAM: PORTABLE CHEST 1 VIEW COMPARISON:  Jun 14, 2021. FINDINGS: Stable cardiomediastinal silhouette. Bilateral perihilar and basilar opacities are noted concerning for pulmonary edema or possibly infiltrate. Bony thorax is unremarkable. Endotracheal and nasogastric tubes have been removed. IMPRESSION: Bilateral perihilar and basilar opacities are noted concerning for pulmonary edema or possibly infiltrates. Electronically Signed   By: JMarijo ConceptionM.D.   On: 06/15/2021 08:36   DG Chest Port 1 View  Result Date: 06/14/2021 CLINICAL DATA:  A 46year old female presents for evaluation of pneumonia. EXAM: PORTABLE CHEST 1 VIEW COMPARISON:  Jun 13, 2021. FINDINGS: EKG leads project over the chest. Endotracheal tube approximately 5.5 cm from the carina, tip between clavicular heads. Previously approximately 3 cm from the carina. Gastric tube courses through in off the field of the radiograph. Appearance of the chest is stable otherwise when compared to the previous exam with graded opacity in the LEFT and RIGHT chest and obscured LEFT and RIGHT hemidiaphragms with increased interstitial markings. On limited assessment there is no acute skeletal finding. IMPRESSION: 1. Endotracheal tube approximately 5.5 cm from the carina, tip  between clavicular heads. This may have been retracted slightly since previous imaging. Attention on follow-up, could consider approximately 1 cm advancement as warranted for more optimal placement. 2. Stable appearance of interstitial prominence, basilar airspace disease and bilateral effusions. These results will be called to the ordering clinician or representative by the Radiologist Assistant, and communication documented in the PACS or [employer] Electronically Signed   By: GZetta BillsM.D.   On: 06/14/2021 08:07   DG Chest Port 1 View  Result Date: 06/13/2021 CLINICAL DATA:  Pneumonia follow-up EXAM: PORTABLE CHEST 1 VIEW COMPARISON:  06/12/2021 FINDINGS: Endotracheal and enteric tubes are again identified. Similar cardiomegaly. Patchy pulmonary opacities remain present. Aeration is slightly improved. No significant pleural effusions. IMPRESSION: Persistent patchy bilateral pulmonary opacities with some improvement in aeration since 06/12/2021. Electronically Signed   By: PMacy MisM.D.   On: 06/13/2021 11:08   DG CHEST PORT 1 VIEW  Result Date: 06/12/2021 CLINICAL DATA:  Provided history: Pulmonary edema, acute. EXAM: PORTABLE CHEST 1 VIEW COMPARISON:  Chest radiographs 06/07/2021. CT angiogram chest 06/05/2021. FINDINGS: Interval intubation. The ET tube terminates at the level of the clavicular heads. Interval placement of an enteric tube. The enteric tube passes below the level  of the left hemidiaphragm with tip excluded from the field of view. Cardiomegaly. Interstitial and ill-defined airspace opacities throughout both lungs, progressed from the prior chest radiographs of 06/07/2021. No definite pleural effusion or evidence of pneumothorax. No acute bony abnormality identified. IMPRESSION: Support apparatus as described. Cardiomegaly. Interstitial and ill-defined airspace opacities throughout both lungs, progressed from the prior chest radiographs of 06/07/2021. Primary differential  considerations include pulmonary edema or atypical/viral pneumonia. Electronically Signed   By: Kellie Simmering D.O.   On: 06/12/2021 13:55   ECHOCARDIOGRAM COMPLETE  Result Date: 06/05/2021    ECHOCARDIOGRAM REPORT   Patient Name:   ASUKA DUSSEAU Date of Exam: 06/05/2021 Medical Rec #:  [id number]       Height:       70.0 in Accession #:    [id number]      Weight:       282.4 lb Date of Birth:  06-17-1975       BSA:          2.417 m Patient Age:    24 years        BP:           115/65 mmHg Patient Gender: F               HR:           84 bpm. Exam Location:  [facility] Procedure: 2D Echo, Cardiac Doppler, Color Doppler and Intracardiac            Opacification Agent Indications:    CHF  History:        Patient has prior history of Echocardiogram examinations, most                 recent 09/03/2017. CHF, Pulmonary HTN, Signs/Symptoms:Shortness                 of Breath; Risk Factors:Hypertension and Current Smoker. Saddle                 embolus of Pulmonary artery, S/P IVC filter.  Sonographer:    Wenda Low Referring Phys: [id number] JAN A Wallace  1. Left ventricular ejection fraction, by estimation, is 45 to 50%. The left ventricle has mildly decreased function. The left ventricle demonstrates global hypokinesis. The left ventricular internal cavity size was moderately dilated. There is mild left ventricular hypertrophy. Left ventricular diastolic parameters are indeterminate.  2. Right ventricular systolic function is normal. The right ventricular size is normal. There is severely elevated pulmonary artery systolic pressure.  3. Left atrial size was severely dilated.  4. Right atrial size was mildly dilated.  5. The mitral valve is abnormal. Mild mitral valve regurgitation. No evidence of mitral stenosis.  6. The tricuspid valve is abnormal.  7. The aortic valve is tricuspid. Aortic valve regurgitation is not visualized. No aortic stenosis is present.  8. The inferior vena cava is normal in size  with greater than 50% respiratory variability, suggesting right atrial pressure of 3 mmHg. FINDINGS  Left Ventricle: Left ventricular ejection fraction, by estimation, is 45 to 50%. The left ventricle has mildly decreased function. The left ventricle demonstrates global hypokinesis. Definity contrast agent was given IV to delineate the left ventricular  endocardial borders. The left ventricular internal cavity size was moderately dilated. There is mild left ventricular hypertrophy. Left ventricular diastolic parameters are indeterminate. Right Ventricle: The right ventricular size is normal. Right vetricular wall thickness was not well visualized. Right ventricular systolic function is normal. There  is severely elevated pulmonary artery systolic pressure. The tricuspid regurgitant velocity is 3.64 m/s, and with an assumed right atrial pressure of 8 mmHg, the estimated right ventricular systolic pressure is 74.1 mmHg. Left Atrium: Left atrial size was severely dilated. Right Atrium: Right atrial size was mildly dilated. Pericardium: There is no evidence of pericardial effusion. Mitral Valve: The mitral valve is abnormal. Mild mitral valve regurgitation. No evidence of mitral valve stenosis. MV peak gradient, 10.0 mmHg. The mean mitral valve gradient is 3.0 mmHg. Tricuspid Valve: The tricuspid valve is abnormal. Tricuspid valve regurgitation is mild . No evidence of tricuspid stenosis. Aortic Valve: The aortic valve is tricuspid. Aortic valve regurgitation is not visualized. No aortic stenosis is present. Aortic valve mean gradient measures 4.0 mmHg. Aortic valve peak gradient measures 7.4 mmHg. Aortic valve area, by VTI measures 3.22 cm. Pulmonic Valve: The pulmonic valve was not well visualized. Pulmonic valve regurgitation is not visualized. No evidence of pulmonic stenosis. Aorta: The aortic root is normal in size and structure. Venous: The inferior vena cava is normal in size with greater than 50% respiratory  variability, suggesting right atrial pressure of 3 mmHg. IAS/Shunts: No atrial level shunt detected by color flow Doppler.  LEFT VENTRICLE PLAX 2D LVIDd:         6.60 cm      Diastology LVIDs:         5.00 cm      LV e' medial:    9.03 cm/s LV PW:         1.30 cm      LV E/e' medial:  14.4 LV IVS:        1.20 cm      LV e' lateral:   9.36 cm/s LVOT diam:     2.20 cm      LV E/e' lateral: 13.9 LV SV:         86 LV SV Index:   36 LVOT Area:     3.80 cm  LV Volumes (MOD) LV vol d, MOD A4C: 167.0 ml LV vol s, MOD A4C: 72.6 ml LV SV MOD A4C:     167.0 ml RIGHT VENTRICLE RV Basal diam:  4.45 cm RV S prime:     11.40 cm/s LEFT ATRIUM              Index        RIGHT ATRIUM           Index LA diam:        5.60 cm  2.32 cm/m   RA Area:     22.20 cm LA Vol (A2C):   127.0 ml 52.54 ml/m  RA Volume:   72.00 ml  29.79 ml/m LA Vol (A4C):   136.0 ml 56.27 ml/m LA Biplane Vol: 132.0 ml 54.61 ml/m  AORTIC VALVE                    PULMONIC VALVE AV Area (Vmax):    3.19 cm     PV Vmax:       0.63 m/s AV Area (Vmean):   3.11 cm     PV Peak grad:  1.6 mmHg AV Area (VTI):     3.22 cm AV Vmax:           136.00 cm/s AV Vmean:          89.800 cm/s AV VTI:            0.268 m AV Peak Grad:  7.4 mmHg AV Mean Grad:      4.0 mmHg LVOT Vmax:         114.00 cm/s LVOT Vmean:        73.500 cm/s LVOT VTI:          0.227 m LVOT/AV VTI ratio: 0.85  AORTA Ao Root diam: 2.90 cm MITRAL VALVE                TRICUSPID VALVE MV Area (PHT): 3.83 cm     TR Peak grad:   53.0 mmHg MV Area VTI:   2.47 cm     TR Vmax:        364.00 cm/s MV Peak grad:  10.0 mmHg MV Mean grad:  3.0 mmHg     SHUNTS MV Vmax:       1.58 m/s     Systemic VTI:  0.23 m MV Vmean:      78.9 cm/s    Systemic Diam: 2.20 cm MV Decel Time: 198 msec MV E velocity: 130.00 cm/s MV A velocity: 60.40 cm/s MV E/A ratio:  2.15 Carlyle Dolly MD Electronically signed by Carlyle Dolly MD Signature Date/Time: 06/05/2021/12:14:52 PM    Final       Subjective: REPORTS FEELING MUCH  BETTER TODAY SEEN IN BEDSIDE CHAIR  Objective: Vitals:   06/16/21 0500 06/16/21 0700 06/16/21 0800 06/16/21 1153  BP: 140/90 (!) 156/64 (!) 159/84   Pulse: 65 69 77   Resp: (!) 23 (!) 22 16   Temp:   98 F (36.7 C) 97.6 F (36.4 C)  TempSrc:   Axillary Axillary  SpO2: 100% 99% 100%   Weight: 122.5 kg     Height:        Intake/Output Summary (Last 24 hours) at 06/16/2021 1212 Last data filed at 06/16/2021 1153 Gross per 24 hour  Intake 1142.08 ml  Output 800 ml  Net 342.08 ml   Filed Weights   06/14/21 0435 06/15/21 0500 06/16/21 0500  Weight: 128.8 kg 123.8 kg 122.5 kg    Examination:  General exam: Appears calm and comfortable  Respiratory system: Clear to auscultation. Respiratory effort normal. Cardiovascular system: S1 & S2 heard, RRR. No JVD, murmurs, rubs, gallops or clicks. No pedal edema. Gastrointestinal system: Abdomen is nondistended, soft and nontender. No organomegaly or masses felt. Normal bowel sounds heard. Central nervous system: Alert and oriented. No focal neurological deficits. Extremities: Symmetric 5 x 5 power. Skin: No rashes, lesions or ulcers Psychiatry: Judgement and insight appear normal. Mood & affect appropriate.     Data Reviewed: I have personally reviewed following labs and imaging studies  CBC: Recent Labs  Lab 06/12/21 1409 06/13/21 0249 06/14/21 0302 06/15/21 0300 06/16/21 0531  WBC 9.2 7.4 6.7 8.6 9.9  NEUTROABS  --  5.2  --   --   --   HGB 8.9* 7.9* 8.5* 8.3* 9.3*  HCT 32.4* 28.5* 31.1* 30.6* 33.7*  MCV 72.0* 71.6* 71.7* 70.8* 72.0*  PLT 261 264 247 272 761   Basic Metabolic Panel: Recent Labs  Lab 06/12/21 2011 06/13/21 0249 06/13/21 1027 06/13/21 1701 06/14/21 0302 06/14/21 1722 06/14/21 2227 06/15/21 1019  NA  --  138   < > 140 141 140 142 142  K  --  3.9   < > 4.5 3.8 4.0 3.8 3.7  CL  --  107   < > 105 104 104 103 103  CO2  --  25   < > '29 30 28 '$ 32 30  GLUCOSE  --  88   < > 83 79 100* 100* 109*  BUN  --   27*   < > 32* 31* 27* 25* 24*  CREATININE  --  0.81   < > 0.95 0.86 0.80 0.83 0.67  CALCIUM  --  8.9   < > 8.9 8.6* 8.9 9.2 9.5  MG 2.2 2.2  --  2.5* 2.3  --   --   --   PHOS 3.7 3.8  --  4.4 4.8*  --   --   --    < > = values in this interval not displayed.   GFR: Estimated Creatinine Clearance: 127.6 mL/min (by C-G formula based on SCr of 0.67 mg/dL). Liver Function Tests: Recent Labs  Lab 06/12/21 1409 06/13/21 0249 06/14/21 2227  AST '22 18 26  '$ ALT '17 17 23  '$ ALKPHOS 38 36* 43  BILITOT 0.4 0.5 0.7  PROT 7.2 6.7 7.7  ALBUMIN 3.0* 2.8* 3.1*   No results for input(s): LIPASE, AMYLASE in the last 168 hours. No results for input(s): AMMONIA in the last 168 hours. Coagulation Profile: Recent Labs  Lab 06/12/21 2011 06/13/21 0249 06/14/21 0302 06/15/21 0300 06/16/21 0531  INR 3.3* 3.2* 2.4* 1.5* 1.3*   Cardiac Enzymes: No results for input(s): CKTOTAL, CKMB, CKMBINDEX, TROPONINI in the last 168 hours. BNP (last 3 results) No results for input(s): PROBNP in the last 8760 hours. HbA1C: No results for input(s): HGBA1C in the last 72 hours. CBG: Recent Labs  Lab 06/13/21 2008 06/13/21 2338 06/14/21 0344 06/14/21 0813 06/14/21 1152  GLUCAP 84 109* 99 92 133*   Lipid Profile: Recent Labs    06/16/21 0531  TRIG 52   Thyroid Function Tests: No results for input(s): TSH, T4TOTAL, FREET4, T3FREE, THYROIDAB in the last 72 hours. Anemia Panel: No results for input(s): VITAMINB12, FOLATE, FERRITIN, TIBC, IRON, RETICCTPCT in the last 72 hours. Sepsis Labs: Recent Labs  Lab 06/12/21 1409  PROCALCITON 0.19  LATICACIDVEN 1.7    Recent Results (from the past 240 hour(s))  MRSA Next Gen by PCR, Nasal     Status: None   Collection Time: 06/12/21  1:11 PM   Specimen: Nasal Mucosa; Nasal Swab  Result Value Ref Range Status   MRSA by PCR Next Gen NOT DETECTED NOT DETECTED Final    Comment: (NOTE) The GeneXpert MRSA Assay (FDA approved for NASAL specimens only), is one  component of a comprehensive MRSA colonization surveillance program. It is not intended to diagnose MRSA infection nor to guide or monitor treatment for MRSA infections. Test performance is not FDA approved in patients less than 82 years old. Performed at [facility], [place] [address] [place], [place] [address]   Respiratory (~20 pathogens) panel by PCR     Status: None   Collection Time: 06/12/21  2:37 PM   Specimen: Nasopharyngeal Swab; Respiratory  Result Value Ref Range Status   Adenovirus NOT DETECTED NOT DETECTED Final   Coronavirus 229E NOT DETECTED NOT DETECTED Final    Comment: (NOTE) The Coronavirus on the Respiratory Panel, DOES NOT test for the novel  Coronavirus (2019 nCoV)    Coronavirus HKU1 NOT DETECTED NOT DETECTED Final   Coronavirus NL63 NOT DETECTED NOT DETECTED Final   Coronavirus OC43 NOT DETECTED NOT DETECTED Final   Metapneumovirus NOT DETECTED NOT DETECTED Final   Rhinovirus / Enterovirus NOT DETECTED NOT DETECTED Final   Influenza A NOT DETECTED NOT DETECTED Final   Influenza B NOT DETECTED NOT  DETECTED Final   Parainfluenza Virus 1 NOT DETECTED NOT DETECTED Final   Parainfluenza Virus 2 NOT DETECTED NOT DETECTED Final   Parainfluenza Virus 3 NOT DETECTED NOT DETECTED Final   Parainfluenza Virus 4 NOT DETECTED NOT DETECTED Final   Respiratory Syncytial Virus NOT DETECTED NOT DETECTED Final   Bordetella pertussis NOT DETECTED NOT DETECTED Final   Bordetella Parapertussis NOT DETECTED NOT DETECTED Final   Chlamydophila pneumoniae NOT DETECTED NOT DETECTED Final   Mycoplasma pneumoniae NOT DETECTED NOT DETECTED Final    Comment: Performed at [facility], [place] [address], [place], [place] [address]  Culture, Respiratory w Gram Stain     Status: None (Preliminary result)   Collection Time: 06/12/21  3:23 PM   Specimen: Tracheal Aspirate; Respiratory  Result Value Ref Range Status   Specimen Description   Final    TRACHEAL  ASPIRATE Performed at [place] [address] [place], [place] [address]    Special Requests   Final    NONE Performed at [facility], [place] [address] [place], [place] [address]    Gram Stain   Final    FEW WBC PRESENT, PREDOMINANTLY PMN NO ORGANISMS SEEN    Culture   Final    CULTURE REINCUBATED FOR BETTER GROWTH Performed at [facility], [place] [address] [place], [place] [address]    Report Status PENDING  Incomplete         Radiology Studies: DG Chest Port 1 View  Result Date: 06/15/2021 CLINICAL DATA:  Pulmonary edema. EXAM: PORTABLE CHEST 1 VIEW COMPARISON:  Jun 14, 2021. FINDINGS: Stable cardiomediastinal silhouette. Bilateral perihilar and basilar opacities are noted concerning for pulmonary edema or possibly infiltrate. Bony thorax is unremarkable. Endotracheal and nasogastric tubes have been removed. IMPRESSION: Bilateral perihilar and basilar opacities are noted concerning for pulmonary edema or possibly infiltrates. Electronically Signed   By: Marijo Conception M.D.   On: 06/15/2021 08:36        Scheduled Meds:  Chlorhexidine Gluconate Cloth  6 each Topical Q0600   furosemide  80 mg Intravenous BID   mouth rinse  15 mL Mouth Rinse BID   Continuous Infusions:  azithromycin Stopped (06/15/21 1757)   heparin 1,950 Units/hr (06/16/21 1107)     LOS: 4 days    Time spent: [place], MD [facility]  If 7PM-7AM, please contact night-coverage  06/16/2021, 12:12 PM

## 2021-06-16 NOTE — Progress Notes (Addendum)
[place] for Heparin while Warfarin on hold Indication: Hx of PE/DVT  Allergies  Allergen Reactions   Sulfa Antibiotics Itching, Swelling and Rash    Lips swelling    Patient Measurements: Height: '5\' 10"'$  (177.8 cm) (per ECHO) Weight: 122.5 kg (270 lb 1 oz) IBW/kg (Calculated) : 68.5 Heparin Dosing Weight: actual  Vital Signs: Temp: 97.8 F (36.6 C) (05/27 0323) Temp Source: Oral (05/27 0323) BP: 140/90 (05/27 0500) Pulse Rate: 65 (05/27 0500)  Labs: Recent Labs    06/14/21 0302 06/14/21 1722 06/14/21 2227 06/15/21 0300 06/15/21 1019 06/15/21 1758 06/16/21 0531  HGB 8.5*  --   --  8.3*  --   --  9.3*  HCT 31.1*  --   --  30.6*  --   --  33.7*  PLT 247  --   --  272  --   --  383  LABPROT 26.0*  --   --  18.0*  --   --  16.2*  INR 2.4*  --   --  1.5*  --   --  1.3*  HEPARINUNFRC  --   --   --   --   --  0.11* 0.38  CREATININE 0.86 0.80 0.83  --  0.67  --   --      Estimated Creatinine Clearance: 127.6 mL/min (by C-G formula based on SCr of 0.67 mg/dL).   Medical History:   Medications:  Scheduled:   Chlorhexidine Gluconate Cloth  6 each Topical Q0600   furosemide  80 mg Intravenous BID   mouth rinse  15 mL Mouth Rinse BID   Warfarin - Pharmacist Dosing Inpatient   Does not apply q1600   Infusions:   azithromycin Stopped (06/15/21 1757)   cefTRIAXone (ROCEPHIN)  IV Stopped (06/15/21 1050)   heparin 1,950 Units/hr (06/16/21 0452)   PRN: ondansetron (ZOFRAN) IV  Per Coumadin Clinic notes- she takes '10mg'$  daily except '15mg'$  on Tues and Sat  Assessment: 46 yo F on warfarin PTA for hx DVT/PE.   INR was supra-therapeutic (3.3) on admission so dose was held; low doses resumed 5/24-25.  Now warfarin is on hold and Pharmacy consulted to dose IV heparin in preparation for cardiac cath on Tuesday 5/30.  She has chronic anemia and is currently menstruating.   Today, 06/16/2021 Heparin level is therapeutic (0.38) on 1950  units/hr INR decreased 1.3 with warfarin on hold (last dose 5/25) CBC: Hgb improved to 9.3, Plts WNL  Goal of Therapy:  Heparin level 0.3-0.7 units/ml Monitor platelets by anticoagulation protocol: Yes   Plan:  Continue heparin drip at current rate Recheck heparin level in 6hrs to confirm therapeutic Daily heparin level and CBC Continue to monitor for signs/symptoms of bleeding  Peggyann Juba, PharmD, BCPS Pharmacy: [phone] 06/16/2021,7:31 AM   Addendum: Heparin level = 0.25, per discussion with RN, patient bends her arm frequently so heparin drip has been paused several times but not off for any prolonged period of time.  Plan: Increase heparin to 2050 units/hr Recheck level in [place], PharmD, BCPS 06/16/2021 12:56 PM

## 2021-06-16 NOTE — Progress Notes (Addendum)
ANTICOAGULATION CONSULT NOTE  Pharmacy Consult for Heparin while Warfarin on hold Indication: Hx of PE/DVT  Allergies  Allergen Reactions   Sulfa Antibiotics Itching, Swelling and Rash    Lips swelling    Patient Measurements: Height: '5\' 10"'$  (177.8 cm) (per ECHO) Weight: 122.5 kg (270 lb 1 oz) IBW/kg (Calculated) : 68.5 Heparin Dosing Weight: actual  Vital Signs: Temp: 98.2 F (36.8 C) (05/27 2014) Temp Source: Oral (05/27 2014) BP: 129/58 (05/27 2000) Pulse Rate: 77 (05/27 2000)  Labs: Recent Labs    06/14/21 0302 06/14/21 1722 06/14/21 2227 06/15/21 0300 06/15/21 1019 06/15/21 1758 06/16/21 0531 06/16/21 1158 06/16/21 1952  HGB 8.5*  --   --  8.3*  --   --  9.3*  --   --   HCT 31.1*  --   --  30.6*  --   --  33.7*  --   --   PLT 247  --   --  272  --   --  383  --   --   LABPROT 26.0*  --   --  18.0*  --   --  16.2*  --   --   INR 2.4*  --   --  1.5*  --   --  1.3*  --   --   HEPARINUNFRC  --   --   --   --   --    < > 0.38 0.25* 0.38  CREATININE 0.86 0.80 0.83  --  0.67  --   --   --   --    < > = values in this interval not displayed.    Estimated Creatinine Clearance: 127.6 mL/min (by C-G formula based on SCr of 0.67 mg/dL).   Medical History:   Medications:  Scheduled:   Chlorhexidine Gluconate Cloth  6 each Topical Q0600   furosemide  80 mg Intravenous BID   Infusions:   heparin 2,050 Units/hr (06/16/21 1419)   PRN: ondansetron (ZOFRAN) IV  Per Coumadin Clinic notes- she takes '10mg'$  daily except '15mg'$  on Tues and Sat  Assessment: 46 yo F on warfarin PTA for hx DVT/PE.   INR was supra-therapeutic (3.3) on admission so dose was held; low doses resumed 5/24-25.  Now warfarin is on hold and Pharmacy consulted to dose IV heparin in preparation for cardiac cath on Tuesday 5/30.  She has chronic anemia and is currently menstruating.   Today, 06/16/2021 Heparin level is therapeutic (0.38) on 2050 units/hr Of note, confirmed with lab that heparin  level reported this evening was from a separate sample (drawn at [facility]) since 2000 heparin level was the same as daily heparin level this AM No issues with heparin infusion, no bleeding noted besides current menses  Goal of Therapy:  Heparin level 0.3-0.7 units/ml Monitor platelets by anticoagulation protocol: Yes   Plan:  Continue heparin drip at 2050 units/hr Daily heparin level and CBC Continue to monitor for signs/symptoms of bleeding  Dimple Nanas, PharmD 06/16/2021 8:36 PM

## 2021-06-17 DIAGNOSIS — Z86711 Personal history of pulmonary embolism: Secondary | ICD-10-CM

## 2021-06-17 DIAGNOSIS — I5023 Acute on chronic systolic (congestive) heart failure: Secondary | ICD-10-CM | POA: Diagnosis not present

## 2021-06-17 DIAGNOSIS — J810 Acute pulmonary edema: Secondary | ICD-10-CM | POA: Diagnosis not present

## 2021-06-17 DIAGNOSIS — J9601 Acute respiratory failure with hypoxia: Secondary | ICD-10-CM | POA: Diagnosis not present

## 2021-06-17 LAB — HEPARIN LEVEL (UNFRACTIONATED)
Heparin Unfractionated: 0.22 IU/mL — ABNORMAL LOW (ref 0.30–0.70)
Heparin Unfractionated: 0.46 IU/mL (ref 0.30–0.70)
Heparin Unfractionated: 0.55 IU/mL (ref 0.30–0.70)

## 2021-06-17 LAB — CBC
HCT: 30.6 % — ABNORMAL LOW (ref 36.0–46.0)
Hemoglobin: 8.2 g/dL — ABNORMAL LOW (ref 12.0–15.0)
MCH: 19.4 pg — ABNORMAL LOW (ref 26.0–34.0)
MCHC: 26.8 g/dL — ABNORMAL LOW (ref 30.0–36.0)
MCV: 72.3 fL — ABNORMAL LOW (ref 80.0–100.0)
Platelets: 348 10*3/uL (ref 150–400)
RBC: 4.23 MIL/uL (ref 3.87–5.11)
RDW: 26 % — ABNORMAL HIGH (ref 11.5–15.5)
WBC: 8.3 10*3/uL (ref 4.0–10.5)
nRBC: 0 % (ref 0.0–0.2)

## 2021-06-17 LAB — PROTIME-INR
INR: 1.2 (ref 0.8–1.2)
Prothrombin Time: 15 seconds (ref 11.4–15.2)

## 2021-06-17 LAB — CULTURE, RESPIRATORY W GRAM STAIN: Culture: NORMAL

## 2021-06-17 NOTE — Progress Notes (Signed)
[place] for Heparin while Warfarin on hold Indication: Hx of PE/DVT  Allergies  Allergen Reactions   Sulfa Antibiotics Itching, Swelling and Rash    Lips swelling    Patient Measurements: Height: '5\' 10"'$  (177.8 cm) (per ECHO) Weight: 123.5 kg (272 lb 4.3 oz) IBW/kg (Calculated) : 68.5 Heparin Dosing Weight: actual  Vital Signs: Temp: 97.5 F (36.4 C) (05/28 0400) Temp Source: Oral (05/28 0400) BP: 133/81 (05/28 0000) Pulse Rate: 82 (05/28 0000)  Labs: Recent Labs     0000 06/14/21 1722 06/14/21 2227 06/15/21 0300 06/15/21 1019 06/15/21 1758 06/16/21 0531 06/16/21 1158 06/16/21 1952 06/17/21 0300  HGB   < >  --   --  8.3*  --   --  9.3*  --   --  8.2*  HCT  --   --   --  30.6*  --   --  33.7*  --   --  30.6*  PLT  --   --   --  272  --   --  383  --   --  348  LABPROT  --   --   --  18.0*  --   --  16.2*  --   --  15.0  INR  --   --   --  1.5*  --   --  1.3*  --   --  1.2  HEPARINUNFRC  --   --   --   --   --    < > 0.38 0.25* 0.38 0.22*  CREATININE  --  0.80 0.83  --  0.67  --   --   --   --   --    < > = values in this interval not displayed.     Estimated Creatinine Clearance: 128.2 mL/min (by C-G formula based on SCr of 0.67 mg/dL).   Medical History:   Medications:  Scheduled:   Chlorhexidine Gluconate Cloth  6 each Topical Q0600   furosemide  80 mg Intravenous BID   Infusions:   heparin 2,050 Units/hr (06/17/21 0123)   PRN: ondansetron (ZOFRAN) IV  Per Coumadin Clinic notes- she takes '10mg'$  daily except '15mg'$  on Tues and Sat  Assessment: 46 yo F on warfarin PTA for hx DVT/PE.   INR was supra-therapeutic (3.3) on admission so dose was held; low doses resumed 5/24-25.  Now warfarin is on hold and Pharmacy consulted to dose IV heparin in preparation for cardiac cath on Tuesday 5/30.  She has chronic anemia and is currently menstruating.   Today, 06/17/2021 HL 0.22 subtherapeutic on 2050 units/hr Hgb 8.2,  Plts WNL Per RN no bleeding or interruptions  Goal of Therapy:  Heparin level 0.3-0.7 units/ml Monitor platelets by anticoagulation protocol: Yes   Plan:  Increase heparin drip to 2250 units/hr Heparin level in 6 hours Daily heparin level and CBC Continue to monitor for signs/symptoms of bleeding  Dolly Rias RPh 06/17/2021, 5:02 AM

## 2021-06-17 NOTE — Progress Notes (Signed)
ANTICOAGULATION CONSULT NOTE  Pharmacy Consult for Heparin while Warfarin on hold Indication: Hx of PE/DVT  Allergies  Allergen Reactions   Sulfa Antibiotics Itching, Swelling and Rash    Lips swelling    Patient Measurements: Height: '5\' 10"'$  (177.8 cm) (per ECHO) Weight: 123.5 kg (272 lb 4.3 oz) IBW/kg (Calculated) : 68.5 Heparin Dosing Weight: actual  Vital Signs: Temp: 97.7 F (36.5 C) (05/28 0730) Temp Source: Oral (05/28 0730) BP: 125/69 (05/28 0600) Pulse Rate: 109 (05/28 1100)  Labs: Recent Labs     0000 06/14/21 1722 06/14/21 2227 06/15/21 0300 06/15/21 1019 06/15/21 1758 06/16/21 0531 06/16/21 1158 06/16/21 1952 06/17/21 0300 06/17/21 1056  HGB   < >  --   --  8.3*  --   --  9.3*  --   --  8.2*  --   HCT  --   --   --  30.6*  --   --  33.7*  --   --  30.6*  --   PLT  --   --   --  272  --   --  383  --   --  348  --   LABPROT  --   --   --  18.0*  --   --  16.2*  --   --  15.0  --   INR  --   --   --  1.5*  --   --  1.3*  --   --  1.2  --   HEPARINUNFRC  --   --   --   --   --    < > 0.38   < > 0.38 0.22* 0.46  CREATININE  --  0.80 0.83  --  0.67  --   --   --   --   --   --    < > = values in this interval not displayed.     Estimated Creatinine Clearance: 128.2 mL/min (by C-G formula based on SCr of 0.67 mg/dL).   Medical History:   Medications:  Scheduled:   Chlorhexidine Gluconate Cloth  6 each Topical Q0600   furosemide  80 mg Intravenous BID   Infusions:   heparin 2,250 Units/hr (06/17/21 0900)   PRN: ondansetron (ZOFRAN) IV  Per Coumadin Clinic notes- she takes '10mg'$  daily except '15mg'$  on Tues and Sat  Assessment: 46 yo F on warfarin PTA for hx DVT/PE.   INR was supra-therapeutic (3.3) on admission so dose was held; low doses resumed 5/24-25.  Now warfarin is on hold and Pharmacy consulted to dose IV heparin in preparation for cardiac cath on Tuesday 5/30.  She has chronic anemia and is currently menstruating.   Today, 06/17/2021 HL  0.46 therapeutic on 2250 units/hr Hgb 8.2, Plts WNL No significant interruptions reported Menstrual flow noted to be lightening  Goal of Therapy:  Heparin level 0.3-0.7 units/ml Monitor platelets by anticoagulation protocol: Yes   Plan:  Continue heparin drip to 2250 units/hr Heparin level in 6 hours to confirm therapeutic Daily heparin level and CBC Continue to monitor for signs/symptoms of bleeding  Peggyann Juba, PharmD, BCPS Pharmacy: [phone] 06/17/2021, 11:31 AM

## 2021-06-17 NOTE — Progress Notes (Addendum)
PROGRESS NOTE    [name] [name]  ZHY:[id number] DOB: 08-02-1975 DOA: 06/12/2021 PCP: Carrolyn Meiers, MD   Brief Narrative:  [name] [name] is a 46 year old female with past medical history of hypertension, history of DVT/PE on lifelong anticoagulation, pulmonary hypertension, OSA on CPAP, history of anemia secondary to menorrhagia/leiomyoma.  Patient had a unprovoked saddle PE in 2015 and was placed on Coumadin therapy since.  Repeat CT showed resolution of the prior PE.  Echocardiogram in August 2019 showed EF 60 to 65%, no regional wall motion abnormality, normal RV.  Patient was last seen by Levell July, NP on 10/19/2020 at which time she was doing well.  She had lost significant amount of weight.  Unfortunately she had unprovoked DVT in January 2023 and was seen by Dr. Unk Lightning of vascular surgery.  Patient reportedly missed 2 doses of Coumadin prior to her DVT.  CTA was negative for PE, but does reveal groundglass opacity in the mid and lower lobe left greater than right which could represent either edema versus pneumonia/pneumonitis.  She ultimately percutaneous mechanical suction thrombectomy by Dr. Gwenlyn Saran of vascular surgery.  More recently, patient was admitted from 5/16 - 5/18 due to community-acquired pneumonia and acute on chronic diastolic heart failure.  Echocardiogram obtained on 06/05/2021 demonstrated EF 45 to 50%, global hypokinesis, mild LVH, severe LAE, mild MR.  She underwent IV diuresis.  Laboratory findings at the time showed a BNP of 537.  She was found to have high-output heart failure in the setting of significant anemia.  She received 3 units of packed red blood cell.  Patient was also treated with IV Rocephin, azithromycin and Omnicef.  Discharge weight was 127.4 kg.  After her discharge, she initially felt well, however symptoms recurred 3 days later on 06/10/2021.  She was having significant nonproductive cough, orthopnea and PND symptom.  She says she was compliant  with her diuretic therapy at home.   Due to worsening shortness of breath, patient ultimately presented to [facility] ED.  She required intubation in the emergency room.  Chest x-ray showed pulmonary edema and possible pneumonia.  COVID test is negative.  She was placed on ventilator and transferred to [facility].  Since admission, she has been treated with IV Lasix and antibiotics.  Patient was ultimately extubated on 06/14/2021 and oxygen requirement has weaned down to 4 L oxygen.  Cardiology service consulted for possible heart failure with plans for heart cath on Tuesday. Transfer out of SDU/ICU sunday   Assessment & Plan:   Principal Problem:   Acute respiratory failure with hypoxia (HCC) Active Problems:   Chronic pain syndrome   Aspiration pneumonia (HCC)   Acute on chronic diastolic CHF (congestive heart failure) (HCC)   OSA on CPAP   Tobacco use disorder   Severe Iron deficiency anemia   Morbid obesity ([place])   History of pulmonary embolism   Acute on chronic systolic congestive heart failure (HCC)   Pulmonary edema   Dysphagia   Acute on chronic combined systolic and diastolic heart failure -Recent echocardiogram shows EF mildly down to 45 to 50%, echocardiogram obtained in the setting of community-acquired pneumonia.          - -C/W IV lasix '80mg'$  BID,  -WEANING 02 -Dr. Johnsie Cancel recommended left and right heart cath, planned cath board for Tue 10:30AM with Dr. Tamala Julian (cath is closed on Mon for Memorial day) -now on heparin gtt   2 Hospital-acquired pneumonia: Receiving IV Rocephin and azithromycin.  3  Acute respiratory failure requiring intubation: Likely related to mild heart failure and also pneumonia, now extubated doing well   4 Pulmonary hypertension: Echocardiogram in April 2015 demonstrated PA peak pressure 53 mmHg, however this was obtained in the setting of acute PE.  Echo in August 2019 did not mention pulmonary artery pressure.  Recent echocardiogram  obtained a week ago demonstrated RV systolic pressure 61 mmHg, however this was obtained in the setting of community-acquired pneumonia.  She has a history of obstructive sleep apnea.     5 Hypertension: c/w lasix   6 Recurrent DVT/PE: On lifelong Coumadin, holding currently, on hep gtt   7 History of anemia secondary to leiomyoma/menorrhagia: monitor cbc on hep gtt, slight drop in hgb, no evidence of bleeding , follow cbc in am  DVT prophylaxis: On:hep gtt   Code Status: full code    Code Status Orders  (From admission, onward)           Start     Ordered   06/12/21 1530  Full code  Continuous        06/12/21 1529           Code Status History     Date Active Date Inactive Code Status Order ID Comments User Context   06/05/2021 0422 06/07/2021 1810 Full Code [id number]  Mansy, Arvella Merles, MD Inpatient   02/24/2015 1405 02/26/2015 1529 Full Code [id number]  Smiley Houseman, MD Inpatient   02/23/2015 0716 02/24/2015 1405 Full Code [id number]  Myrtis Ser, CNM Inpatient   07/12/2013 1004 07/13/2013 0337 Full Code [id number]  Greggory Keen, MD HOV   04/29/2013 0957 04/30/2013 0336 Full Code [id number]  Greggory Keen, MD HOV   04/27/2013 2200 04/29/2013 0957 Full Code [id number]  Theressa Millard, MD Inpatient      Family Communication: p;t and friend at bedside  Disposition Plan:  planned cath tuesday  Consults called: None Admission status: Inpatient   Consultants:  cards  Procedures:  DG Chest 2 View  Result Date: 06/07/2021 CLINICAL DATA:  Cough EXAM: CHEST - 2 VIEW COMPARISON:  06/04/2021 FINDINGS: Stable heart size. Mild pulmonary vascular congestion with bilateral perihilar and bibasilar interstitial opacities. Slightly improving aeration within the left lung base compared to prior. No pleural effusion or pneumothorax. IMPRESSION: Persistent bilateral interstitial opacities with slightly improving aeration within the left lung base compared to prior. Electronically Signed    By: Davina Poke D.O.   On: 06/07/2021 08:24   DG Chest 2 View  Result Date: 06/04/2021 CLINICAL DATA:  Shortness of breath for 3 days with productive cough. Intermittent chest pain and lethargy. EXAM: CHEST - 2 VIEW COMPARISON:  Radiographs 04/27/2013. Chest CT 10/21/2013. Abdominal CT 01/30/2021. FINDINGS: The heart size and mediastinal contours are normal. There is vascular congestion with new interstitial opacities in both lungs, diffuse fissural thickening and patchy left lower lobe airspace disease. No pneumothorax or significant pleural effusion. The bones appear unchanged. There are degenerative changes throughout the thoracic spine. IMPRESSION: Vascular congestion with diffuse interstitial opacities, patchy left lower lobe airspace disease and diffuse interstitial thickening. Findings may be secondary to pulmonary edema or atypical infection. Electronically Signed   By: Richardean Sale M.D.   On: 06/04/2021 14:10   CT Angio Chest PE W and/or Wo Contrast  Result Date: 06/05/2021 CLINICAL DATA:  Pulmonary embolism (PE) suspected, high prob. Productive cough, shortness of breath, occasional chest pain EXAM: CT ANGIOGRAPHY CHEST WITH CONTRAST TECHNIQUE: Multidetector CT imaging of the chest  was performed using the standard protocol during bolus administration of intravenous contrast. Multiplanar CT image reconstructions and MIPs were obtained to evaluate the vascular anatomy. RADIATION DOSE REDUCTION: This exam was performed according to the departmental dose-optimization program which includes automated exposure control, adjustment of the mA and/or kV according to patient size and/or use of iterative reconstruction technique. CONTRAST:  141m OMNIPAQUE IOHEXOL 350 MG/ML SOLN COMPARISON:  10/21/2013 FINDINGS: Cardiovascular: Heart is normal size. Aorta is normal caliber. No filling defects in the pulmonary arteries to suggest pulmonary emboli. Mediastinum/Nodes: No mediastinal, hilar, or axillary  adenopathy. Trachea and esophagus are unremarkable. Thyroid unremarkable. Lungs/Pleura: Ground-glass opacities in the mid and lower lungs. This could reflect edema or atypical infection/pneumonitis. No effusions. Upper Abdomen: No acute findings Musculoskeletal: Chest wall soft tissues are unremarkable. No acute bony abnormality. Review of the MIP images confirms the above findings. IMPRESSION: No evidence of pulmonary embolus. Ground-glass opacities in the mid and lower lungs, left greater than right. This could reflect edema or pneumonia/pneumonitis. Electronically Signed   By: KRolm BaptiseM.D.   On: 06/05/2021 01:15   DG Chest Port 1 View  Result Date: 06/15/2021 CLINICAL DATA:  Pulmonary edema. EXAM: PORTABLE CHEST 1 VIEW COMPARISON:  Jun 14, 2021. FINDINGS: Stable cardiomediastinal silhouette. Bilateral perihilar and basilar opacities are noted concerning for pulmonary edema or possibly infiltrate. Bony thorax is unremarkable. Endotracheal and nasogastric tubes have been removed. IMPRESSION: Bilateral perihilar and basilar opacities are noted concerning for pulmonary edema or possibly infiltrates. Electronically Signed   By: JMarijo ConceptionM.D.   On: 06/15/2021 08:36   DG Chest Port 1 View  Result Date: 06/14/2021 CLINICAL DATA:  A 46year old female presents for evaluation of pneumonia. EXAM: PORTABLE CHEST 1 VIEW COMPARISON:  Jun 13, 2021. FINDINGS: EKG leads project over the chest. Endotracheal tube approximately 5.5 cm from the carina, tip between clavicular heads. Previously approximately 3 cm from the carina. Gastric tube courses through in off the field of the radiograph. Appearance of the chest is stable otherwise when compared to the previous exam with graded opacity in the LEFT and RIGHT chest and obscured LEFT and RIGHT hemidiaphragms with increased interstitial markings. On limited assessment there is no acute skeletal finding. IMPRESSION: 1. Endotracheal tube approximately 5.5 cm from the  carina, tip between clavicular heads. This may have been retracted slightly since previous imaging. Attention on follow-up, could consider approximately 1 cm advancement as warranted for more optimal placement. 2. Stable appearance of interstitial prominence, basilar airspace disease and bilateral effusions. These results will be called to the ordering clinician or representative by the Radiologist Assistant, and communication documented in the PACS or [employer] Electronically Signed   By: GZetta BillsM.D.   On: 06/14/2021 08:07   DG Chest Port 1 View  Result Date: 06/13/2021 CLINICAL DATA:  Pneumonia follow-up EXAM: PORTABLE CHEST 1 VIEW COMPARISON:  06/12/2021 FINDINGS: Endotracheal and enteric tubes are again identified. Similar cardiomegaly. Patchy pulmonary opacities remain present. Aeration is slightly improved. No significant pleural effusions. IMPRESSION: Persistent patchy bilateral pulmonary opacities with some improvement in aeration since 06/12/2021. Electronically Signed   By: PMacy MisM.D.   On: 06/13/2021 11:08   DG CHEST PORT 1 VIEW  Result Date: 06/12/2021 CLINICAL DATA:  Provided history: Pulmonary edema, acute. EXAM: PORTABLE CHEST 1 VIEW COMPARISON:  Chest radiographs 06/07/2021. CT angiogram chest 06/05/2021. FINDINGS: Interval intubation. The ET tube terminates at the level of the clavicular heads. Interval placement of an enteric tube. The enteric  tube passes below the level of the left hemidiaphragm with tip excluded from the field of view. Cardiomegaly. Interstitial and ill-defined airspace opacities throughout both lungs, progressed from the prior chest radiographs of 06/07/2021. No definite pleural effusion or evidence of pneumothorax. No acute bony abnormality identified. IMPRESSION: Support apparatus as described. Cardiomegaly. Interstitial and ill-defined airspace opacities throughout both lungs, progressed from the prior chest radiographs of 06/07/2021. Primary  differential considerations include pulmonary edema or atypical/viral pneumonia. Electronically Signed   By: Kellie Simmering D.O.   On: 06/12/2021 13:55   ECHOCARDIOGRAM COMPLETE  Result Date: 06/05/2021    ECHOCARDIOGRAM REPORT   Patient Name:   SHAILI DONALSON Date of Exam: 06/05/2021 Medical Rec #:  [id number]       Height:       70.0 in Accession #:    [id number]      Weight:       282.4 lb Date of Birth:  06/05/1975       BSA:          2.417 m Patient Age:    45 years        BP:           115/65 mmHg Patient Gender: F               HR:           84 bpm. Exam Location:  [facility] Procedure: 2D Echo, Cardiac Doppler, Color Doppler and Intracardiac            Opacification Agent Indications:    CHF  History:        Patient has prior history of Echocardiogram examinations, most                 recent 09/03/2017. CHF, Pulmonary HTN, Signs/Symptoms:Shortness                 of Breath; Risk Factors:Hypertension and Current Smoker. Saddle                 embolus of Pulmonary artery, S/P IVC filter.  Sonographer:    Wenda Low Referring Phys: [id number] JAN A [place]  1. Left ventricular ejection fraction, by estimation, is 45 to 50%. The left ventricle has mildly decreased function. The left ventricle demonstrates global hypokinesis. The left ventricular internal cavity size was moderately dilated. There is mild left ventricular hypertrophy. Left ventricular diastolic parameters are indeterminate.  2. Right ventricular systolic function is normal. The right ventricular size is normal. There is severely elevated pulmonary artery systolic pressure.  3. Left atrial size was severely dilated.  4. Right atrial size was mildly dilated.  5. The mitral valve is abnormal. Mild mitral valve regurgitation. No evidence of mitral stenosis.  6. The tricuspid valve is abnormal.  7. The aortic valve is tricuspid. Aortic valve regurgitation is not visualized. No aortic stenosis is present.  8. The inferior vena cava is  normal in size with greater than 50% respiratory variability, suggesting right atrial pressure of 3 mmHg. FINDINGS  Left Ventricle: Left ventricular ejection fraction, by estimation, is 45 to 50%. The left ventricle has mildly decreased function. The left ventricle demonstrates global hypokinesis. Definity contrast agent was given IV to delineate the left ventricular  endocardial borders. The left ventricular internal cavity size was moderately dilated. There is mild left ventricular hypertrophy. Left ventricular diastolic parameters are indeterminate. Right Ventricle: The right ventricular size is normal. Right vetricular wall thickness was not well visualized. Right ventricular  systolic function is normal. There is severely elevated pulmonary artery systolic pressure. The tricuspid regurgitant velocity is 3.64 m/s, and with an assumed right atrial pressure of 8 mmHg, the estimated right ventricular systolic pressure is 16.1 mmHg. Left Atrium: Left atrial size was severely dilated. Right Atrium: Right atrial size was mildly dilated. Pericardium: There is no evidence of pericardial effusion. Mitral Valve: The mitral valve is abnormal. Mild mitral valve regurgitation. No evidence of mitral valve stenosis. MV peak gradient, 10.0 mmHg. The mean mitral valve gradient is 3.0 mmHg. Tricuspid Valve: The tricuspid valve is abnormal. Tricuspid valve regurgitation is mild . No evidence of tricuspid stenosis. Aortic Valve: The aortic valve is tricuspid. Aortic valve regurgitation is not visualized. No aortic stenosis is present. Aortic valve mean gradient measures 4.0 mmHg. Aortic valve peak gradient measures 7.4 mmHg. Aortic valve area, by VTI measures 3.22 cm. Pulmonic Valve: The pulmonic valve was not well visualized. Pulmonic valve regurgitation is not visualized. No evidence of pulmonic stenosis. Aorta: The aortic root is normal in size and structure. Venous: The inferior vena cava is normal in size with greater than 50%  respiratory variability, suggesting right atrial pressure of 3 mmHg. IAS/Shunts: No atrial level shunt detected by color flow Doppler.  LEFT VENTRICLE PLAX 2D LVIDd:         6.60 cm      Diastology LVIDs:         5.00 cm      LV e' medial:    9.03 cm/s LV PW:         1.30 cm      LV E/e' medial:  14.4 LV IVS:        1.20 cm      LV e' lateral:   9.36 cm/s LVOT diam:     2.20 cm      LV E/e' lateral: 13.9 LV SV:         86 LV SV Index:   36 LVOT Area:     3.80 cm  LV Volumes (MOD) LV vol d, MOD A4C: 167.0 ml LV vol s, MOD A4C: 72.6 ml LV SV MOD A4C:     167.0 ml RIGHT VENTRICLE RV Basal diam:  4.45 cm RV S prime:     11.40 cm/s LEFT ATRIUM              Index        RIGHT ATRIUM           Index LA diam:        5.60 cm  2.32 cm/m   RA Area:     22.20 cm LA Vol (A2C):   127.0 ml 52.54 ml/m  RA Volume:   72.00 ml  29.79 ml/m LA Vol (A4C):   136.0 ml 56.27 ml/m LA Biplane Vol: 132.0 ml 54.61 ml/m  AORTIC VALVE                    PULMONIC VALVE AV Area (Vmax):    3.19 cm     PV Vmax:       0.63 m/s AV Area (Vmean):   3.11 cm     PV Peak grad:  1.6 mmHg AV Area (VTI):     3.22 cm AV Vmax:           136.00 cm/s AV Vmean:          89.800 cm/s AV VTI:            0.268 m  AV Peak Grad:      7.4 mmHg AV Mean Grad:      4.0 mmHg LVOT Vmax:         114.00 cm/s LVOT Vmean:        73.500 cm/s LVOT VTI:          0.227 m LVOT/AV VTI ratio: 0.85  AORTA Ao Root diam: 2.90 cm MITRAL VALVE                TRICUSPID VALVE MV Area (PHT): 3.83 cm     TR Peak grad:   53.0 mmHg MV Area VTI:   2.47 cm     TR Vmax:        364.00 cm/s MV Peak grad:  10.0 mmHg MV Mean grad:  3.0 mmHg     SHUNTS MV Vmax:       1.58 m/s     Systemic VTI:  0.23 m MV Vmean:      78.9 cm/s    Systemic Diam: 2.20 cm MV Decel Time: 198 msec MV E velocity: 130.00 cm/s MV A velocity: 60.40 cm/s MV E/A ratio:  2.15 Carlyle Dolly MD Electronically signed by Carlyle Dolly MD Signature Date/Time: 06/05/2021/12:14:52 PM    Final     Antimicrobials:  Finishing  azith/ctx    Subjective: Doing well No complaints  Objective: Vitals:   06/17/21 0600 06/17/21 0700 06/17/21 0715 06/17/21 0730  BP: 125/69     Pulse: 70 83 73   Resp: '20 19 20   '$ Temp:    97.7 F (36.5 C)  TempSrc:    Oral  SpO2: 95% 100% 100%   Weight:      Height:        Intake/Output Summary (Last 24 hours) at 06/17/2021 1018 Last data filed at 06/17/2021 0900 Gross per 24 hour  Intake 1309.19 ml  Output 0 ml  Net 1309.19 ml   Filed Weights   06/15/21 0500 06/16/21 0500 06/17/21 0447  Weight: 123.8 kg 122.5 kg 123.5 kg    Examination:  General exam: Appears calm and comfortable  Respiratory system: Clear to auscultation. Respiratory effort normal. Cardiovascular system: S1 & S2 heard, RRR. No JVD, murmurs, rubs, gallops or clicks. No pedal edema. Gastrointestinal system: Abdomen is nondistended, soft and nontender. No organomegaly or masses felt. Normal bowel sounds heard. Central nervous system: Alert and oriented. No focal neurological deficits. Extremities: Symmetric 5 x 5 power. Skin: No rashes, lesions or ulcers Psychiatry: Judgement and insight appear normal. Mood & affect appropriate.     Data Reviewed: I have personally reviewed following labs and imaging studies  CBC: Recent Labs  Lab 06/13/21 0249 06/14/21 0302 06/15/21 0300 06/16/21 0531 06/17/21 0300  WBC 7.4 6.7 8.6 9.9 8.3  NEUTROABS 5.2  --   --   --   --   HGB 7.9* 8.5* 8.3* 9.3* 8.2*  HCT 28.5* 31.1* 30.6* 33.7* 30.6*  MCV 71.6* 71.7* 70.8* 72.0* 72.3*  PLT 264 247 272 383 053   Basic Metabolic Panel: Recent Labs  Lab 06/12/21 2011 06/13/21 0249 06/13/21 1027 06/13/21 1701 06/14/21 0302 06/14/21 1722 06/14/21 2227 06/15/21 1019  NA  --  138   < > 140 141 140 142 142  K  --  3.9   < > 4.5 3.8 4.0 3.8 3.7  CL  --  107   < > 105 104 104 103 103  CO2  --  25   < > '29 30 28 '$ 32 30  GLUCOSE  --  88   < > 83 79 100* 100* 109*  BUN  --  27*   < > 32* 31* 27* 25* 24*  CREATININE   --  0.81   < > 0.95 0.86 0.80 0.83 0.67  CALCIUM  --  8.9   < > 8.9 8.6* 8.9 9.2 9.5  MG 2.2 2.2  --  2.5* 2.3  --   --   --   PHOS 3.7 3.8  --  4.4 4.8*  --   --   --    < > = values in this interval not displayed.   GFR: Estimated Creatinine Clearance: 128.2 mL/min (by C-G formula based on SCr of 0.67 mg/dL). Liver Function Tests: Recent Labs  Lab 06/12/21 1409 06/13/21 0249 06/14/21 2227  AST '22 18 26  '$ ALT '17 17 23  '$ ALKPHOS 38 36* 43  BILITOT 0.4 0.5 0.7  PROT 7.2 6.7 7.7  ALBUMIN 3.0* 2.8* 3.1*   No results for input(s): LIPASE, AMYLASE in the last 168 hours. No results for input(s): AMMONIA in the last 168 hours. Coagulation Profile: Recent Labs  Lab 06/13/21 0249 06/14/21 0302 06/15/21 0300 06/16/21 0531 06/17/21 0300  INR 3.2* 2.4* 1.5* 1.3* 1.2   Cardiac Enzymes: No results for input(s): CKTOTAL, CKMB, CKMBINDEX, TROPONINI in the last 168 hours. BNP (last 3 results) No results for input(s): PROBNP in the last 8760 hours. HbA1C: No results for input(s): HGBA1C in the last 72 hours. CBG: Recent Labs  Lab 06/13/21 2008 06/13/21 2338 06/14/21 0344 06/14/21 0813 06/14/21 1152  GLUCAP 84 109* 99 92 133*   Lipid Profile: Recent Labs    06/16/21 0531  TRIG 52   Thyroid Function Tests: No results for input(s): TSH, T4TOTAL, FREET4, T3FREE, THYROIDAB in the last 72 hours. Anemia Panel: No results for input(s): VITAMINB12, FOLATE, FERRITIN, TIBC, IRON, RETICCTPCT in the last 72 hours. Sepsis Labs: Recent Labs  Lab 06/12/21 1409  PROCALCITON 0.19  LATICACIDVEN 1.7    Recent Results (from the past 240 hour(s))  MRSA Next Gen by PCR, Nasal     Status: None   Collection Time: 06/12/21  1:11 PM   Specimen: Nasal Mucosa; Nasal Swab  Result Value Ref Range Status   MRSA by PCR Next Gen NOT DETECTED NOT DETECTED Final    Comment: (NOTE) The GeneXpert MRSA Assay (FDA approved for NASAL specimens only), is one component of a comprehensive MRSA  colonization surveillance program. It is not intended to diagnose MRSA infection nor to guide or monitor treatment for MRSA infections. Test performance is not FDA approved in patients less than 40 years old. Performed at [facility], [place] [address], [place], [place] [address]   Respiratory (~20 pathogens) panel by PCR     Status: None   Collection Time: 06/12/21  2:37 PM   Specimen: Nasopharyngeal Swab; Respiratory  Result Value Ref Range Status   Adenovirus NOT DETECTED NOT DETECTED Final   Coronavirus 229E NOT DETECTED NOT DETECTED Final    Comment: (NOTE) The Coronavirus on the Respiratory Panel, DOES NOT test for the novel  Coronavirus (2019 nCoV)    Coronavirus HKU1 NOT DETECTED NOT DETECTED Final   Coronavirus NL63 NOT DETECTED NOT DETECTED Final   Coronavirus OC43 NOT DETECTED NOT DETECTED Final   Metapneumovirus NOT DETECTED NOT DETECTED Final   Rhinovirus / Enterovirus NOT DETECTED NOT DETECTED Final   Influenza A NOT DETECTED NOT DETECTED Final   Influenza B NOT DETECTED NOT  DETECTED Final   Parainfluenza Virus 1 NOT DETECTED NOT DETECTED Final   Parainfluenza Virus 2 NOT DETECTED NOT DETECTED Final   Parainfluenza Virus 3 NOT DETECTED NOT DETECTED Final   Parainfluenza Virus 4 NOT DETECTED NOT DETECTED Final   Respiratory Syncytial Virus NOT DETECTED NOT DETECTED Final   Bordetella pertussis NOT DETECTED NOT DETECTED Final   Bordetella Parapertussis NOT DETECTED NOT DETECTED Final   Chlamydophila pneumoniae NOT DETECTED NOT DETECTED Final   Mycoplasma pneumoniae NOT DETECTED NOT DETECTED Final    Comment: Performed at [facility], [place] [address] [place], [place] [address]  Culture, Respiratory w Gram Stain     Status: None   Collection Time: 06/12/21  3:23 PM   Specimen: Tracheal Aspirate; Respiratory  Result Value Ref Range Status   Specimen Description   Final    TRACHEAL ASPIRATE Performed at [place]  [address], [place], [place] [address]    Special Requests   Final    NONE Performed at [facility], [place] [address], [place], [place] [address]    Gram Stain   Final    FEW WBC PRESENT, PREDOMINANTLY PMN NO ORGANISMS SEEN    Culture   Final    RARE Normal respiratory flora-no Staph aureus or Pseudomonas seen Performed at [place] [address], [place], [place] [address]    Report Status 06/17/2021 FINAL  Final         Radiology Studies: No results found.      Scheduled Meds:  Chlorhexidine Gluconate Cloth  6 each Topical Q0600   furosemide  80 mg Intravenous BID   Continuous Infusions:  heparin 2,250 Units/hr (06/17/21 0900)     LOS: 5 days    Time spent: 76 min    Nicolette Bang, MD [facility]  If 7PM-7AM, please contact night-coverage  06/17/2021, 10:18 AM

## 2021-06-17 NOTE — Plan of Care (Signed)

## 2021-06-17 NOTE — Progress Notes (Signed)
Progress Note  Patient Name: [name] [name] Date of Encounter: 06/17/2021  [facility] Cardiologist: Carlyle Dolly, MD   Subjective   Back to near baseline Ok to transfer to non telemetry floor   Inpatient Medications    Scheduled Meds:  Chlorhexidine Gluconate Cloth  6 each Topical Q0600   furosemide  80 mg Intravenous BID   Continuous Infusions:  heparin 2,250 Units/hr (06/17/21 0503)   PRN Meds: ondansetron (ZOFRAN) IV   Vital Signs    Vitals:   06/17/21 0447 06/17/21 0600 06/17/21 0700 06/17/21 0715  BP:  125/69    Pulse:  70 83 73  Resp:  '20 19 20  '$ Temp:      TempSrc:      SpO2:  95% 100% 100%  Weight: 123.5 kg     Height:        Intake/Output Summary (Last 24 hours) at 06/17/2021 0834 Last data filed at 06/17/2021 0000 Gross per 24 hour  Intake 1270.56 ml  Output 0 ml  Net 1270.56 ml      06/17/2021    4:47 AM 06/16/2021    5:00 AM 06/15/2021    5:00 AM  Last 3 Weights  Weight (lbs) 272 lb 4.3 oz 270 lb 1 oz 272 lb 14.9 oz  Weight (kg) 123.5 kg 122.5 kg 123.8 kg      Telemetry    NSR with HR 60-90s - Personally Reviewed  ECG    NSR with LAFB - Personally Reviewed  Physical Exam   GEN: No acute distress.   Neck: No JVD Cardiac: RRR, no murmurs, rubs, or gallops.  Respiratory: Clear to auscultation bilaterally. GI: Soft, nontender, non-distended  MS: No edema; No deformity. Neuro:  Nonfocal  Psych: Normal affect   Labs    High Sensitivity Troponin:   Recent Labs  Lab 06/04/21 2321 06/12/21 1409  TROPONINIHS 13 8     Chemistry Recent Labs  Lab 06/12/21 1409 06/12/21 2011 06/13/21 0249 06/13/21 1027 06/13/21 1701 06/14/21 0302 06/14/21 1722 06/14/21 2227 06/15/21 1019  NA 135  --  138   < > 140 141 140 142 142  K 4.1  --  3.9   < > 4.5 3.8 4.0 3.8 3.7  CL 104  --  107   < > 105 104 104 103 103  CO2 23  --  25   < > '29 30 28 '$ 32 30  GLUCOSE 82  --  88   < > 83 79 100* 100* 109*  BUN 23*  --  27*   < > 32* 31* 27*  25* 24*  CREATININE 0.95  --  0.81   < > 0.95 0.86 0.80 0.83 0.67  CALCIUM 8.8*  --  8.9   < > 8.9 8.6* 8.9 9.2 9.5  MG  --    < > 2.2  --  2.5* 2.3  --   --   --   PROT 7.2  --  6.7  --   --   --   --  7.7  --   ALBUMIN 3.0*  --  2.8*  --   --   --   --  3.1*  --   AST 22  --  18  --   --   --   --  26  --   ALT 17  --  17  --   --   --   --  23  --   ALKPHOS 38  --  36*  --   --   --   --  43  --   BILITOT 0.4  --  0.5  --   --   --   --  0.7  --   GFRNONAA >60  --  >60   < > >60 >60 >60 >60 >60  ANIONGAP 8  --  6   < > '6 7 8 7 9   '$ < > = values in this interval not displayed.    Lipids  Recent Labs  Lab 06/16/21 0531  TRIG 52    Hematology Recent Labs  Lab 06/15/21 0300 06/16/21 0531 06/17/21 0300  WBC 8.6 9.9 8.3  RBC 4.32 4.68 4.23  HGB 8.3* 9.3* 8.2*  HCT 30.6* 33.7* 30.6*  MCV 70.8* 72.0* 72.3*  MCH 19.2* 19.9* 19.4*  MCHC 27.1* 27.6* 26.8*  RDW 26.5* 26.5* 26.0*  PLT 272 383 348   Thyroid No results for input(s): TSH, FREET4 in the last 168 hours.  BNP Recent Labs  Lab 06/12/21 1409  BNP 293.6*    DDimer No results for input(s): DDIMER in the last 168 hours.   Radiology    No results found.  Cardiac Studies   Echo 06/05/2021 1. Left ventricular ejection fraction, by estimation, is 45 to 50%. The  left ventricle has mildly decreased function. The left ventricle  demonstrates global hypokinesis. The left ventricular internal cavity size  was moderately dilated. There is mild  left ventricular hypertrophy. Left ventricular diastolic parameters are  indeterminate.   2. Right ventricular systolic function is normal. The right ventricular  size is normal. There is severely elevated pulmonary artery systolic  pressure.   3. Left atrial size was severely dilated.   4. Right atrial size was mildly dilated.   5. The mitral valve is abnormal. Mild mitral valve regurgitation. No  evidence of mitral stenosis.   6. The tricuspid valve is abnormal.   7. The  aortic valve is tricuspid. Aortic valve regurgitation is not  visualized. No aortic stenosis is present.   8. The inferior vena cava is normal in size with greater than 50%  respiratory variability, suggesting right atrial pressure of 3 mmHg.   Patient Profile     46 y.o. 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 Erin Fulling.  Assessment & Plan    Acute on chronic combined systolic and diastolic heart failure             -Recent echocardiogram shows EF mildly down to 45 to 50%, echocardiogram obtained in the setting of community-acquired pneumonia.              -right and left cath Tuesday ( Monday is Holiday ) Continue with iv lasix for diuresis Will start low dose Entresto    Hospital-acquired pneumonia: Receiving IV Rocephin and azithromycin.   Acute respiratory failure requiring intubation: Likely related to mild heart failure and also pneumonia   Pulmonary hypertension: Echocardiogram in April 2015 demonstrated PA peak pressure 53 mmHg, however this was obtained in the setting of acute PE.  Echo in August 2019 did not mention pulmonary artery pressure.  Recent echocardiogram obtained a week ago demonstrated RV systolic pressure 61 mmHg, however this was obtained in the setting of community-acquired pneumonia.  right heart cath Tuesday   Hypertension  Recurrent DVT/PE: On lifelong Coumadin currently covering with heparin resume coumadin post cath    History of anemia secondary to leiomyoma/menorrhagia Hct 33.7      For questions or updates, please contact [place] Please consult [email or web address] for contact info under        Signed, Jenkins Rouge, MD  06/17/2021, 8:34 AM

## 2021-06-17 NOTE — Progress Notes (Signed)
Pharmacy: Re- heparin  Patient is a 46 y.o F with hx DVT/PE on warfarin PTA who presented to [facility] on 06/11/21 with worsening of SOB.  She was subsequently intubated and transferred to [facility] on 06/12/21 for further management.  Warfarin resumed on 06/13/21 and then changed to heparin drip on 06/15/21 in anticipation for right and left heart cath on 06/19/21.  - confirmatory heparin level collected at ~5p is therapeutic at 0.55 with drip infusing at 2250 units/hr  Goal of Therapy:  Heparin level 0.3-0.7 units/ml Monitor platelets by anticoagulation protocol: Yes  Plan: - continue heparin drip at 2250 units/hr - daily heparin level and cbc - monitor for s/sx bleeding  Dia Sitter, PharmD, BCPS 06/17/2021 6:02 PM

## 2021-06-18 DIAGNOSIS — I5023 Acute on chronic systolic (congestive) heart failure: Secondary | ICD-10-CM | POA: Diagnosis not present

## 2021-06-18 DIAGNOSIS — J9601 Acute respiratory failure with hypoxia: Secondary | ICD-10-CM | POA: Diagnosis not present

## 2021-06-18 DIAGNOSIS — J810 Acute pulmonary edema: Secondary | ICD-10-CM | POA: Diagnosis not present

## 2021-06-18 LAB — CBC
HCT: 31.8 % — ABNORMAL LOW (ref 36.0–46.0)
Hemoglobin: 8.2 g/dL — ABNORMAL LOW (ref 12.0–15.0)
MCH: 19.7 pg — ABNORMAL LOW (ref 26.0–34.0)
MCHC: 25.8 g/dL — ABNORMAL LOW (ref 30.0–36.0)
MCV: 76.3 fL — ABNORMAL LOW (ref 80.0–100.0)
Platelets: 323 10*3/uL (ref 150–400)
RBC: 4.17 MIL/uL (ref 3.87–5.11)
RDW: 26.3 % — ABNORMAL HIGH (ref 11.5–15.5)
WBC: 7.5 10*3/uL (ref 4.0–10.5)
nRBC: 0 % (ref 0.0–0.2)

## 2021-06-18 LAB — HEPARIN LEVEL (UNFRACTIONATED): Heparin Unfractionated: 0.43 IU/mL (ref 0.30–0.70)

## 2021-06-18 LAB — PROTIME-INR
INR: 1.2 (ref 0.8–1.2)
Prothrombin Time: 14.9 seconds (ref 11.4–15.2)

## 2021-06-18 NOTE — Progress Notes (Signed)
PROGRESS NOTE  [name] [name] OBS:[id number] DOB: 1975-04-01 DOA: 06/12/2021 PCP: Carrolyn Meiers, MD  HPI/Recap of past 24 hours: 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  Patient had a unprovoked saddle PE in 2015 and was placed on Coumadin therapy since.  Repeat CT showed resolution of the prior PE.  Echocardiogram in August 2019 showed EF 60 to 65%, no regional wall motion abnormality, normal RV.  Patient was last seen by Levell July, NP on 10/19/2020 at which time she was doing well.  She had lost significant amount of weight.  Unfortunately she had unprovoked DVT in January 2023 and was seen by Dr. Unk Lightning of vascular surgery.  Patient reportedly missed 2 doses of Coumadin prior to her DVT.  CTA was negative for PE, but does reveal groundglass opacity in the mid and lower lobe left greater than right which could represent either edema versus pneumonia/pneumonitis.  She ultimately percutaneous mechanical suction thrombectomy by Dr. Gwenlyn Saran of vascular surgery.  More recently, patient was admitted from 5/16 - 5/18 due to community-acquired pneumonia and acute on chronic diastolic heart failure.  Echocardiogram obtained on 06/05/2021 demonstrated EF 45 to 50%, global hypokinesis, mild LVH, severe LAE, mild MR.  She underwent IV diuresis.  Laboratory findings at the time showed a BNP of 537.  She was found to have high-output heart failure in the setting of significant anemia.  She received 3 units of packed red blood cell.  Patient was also treated with IV Rocephin, azithromycin and Omnicef.  Discharge weight was 127.4 kg.  After her discharge, she initially felt well, however symptoms recurred 3 days later on 06/10/2021.  She was having significant nonproductive cough, orthopnea and PND symptom.  She says she was  compliant with her diuretic therapy at home.   Due to worsening shortness of breath, patient ultimately presented to [facility] ED.  She required intubation in the emergency room.  Chest x-ray showed pulmonary edema and possible pneumonia.  COVID test is negative.  She was placed on ventilator and transferred to [facility].  Since admission, she has been treated with IV Lasix and antibiotics.  Patient was ultimately extubated on 06/14/2021 and oxygen requirement has weaned down to 4 L oxygen.  Cardiology service consulted for possible heart failure with plans for heart cath on Tuesday. Transfer out of SDU/ICU Sunday.  Full code seen and examined at bedside.  She states she feels better today.  No chest pain.  Cough and breathing are improved.   Assessment/Plan: Principal Problem:   Acute respiratory failure with hypoxia (HCC) Active Problems:   Chronic pain syndrome   Aspiration pneumonia (HCC)   Acute on chronic diastolic CHF (congestive heart failure) (HCC)   OSA on CPAP   Tobacco use disorder   Severe Iron deficiency anemia   Morbid obesity ([place])   History of pulmonary embolism   Acute on chronic systolic congestive heart failure (HCC)   Pulmonary edema   Dysphagia  Acute on chronic combined systolic and diastolic heart failure -Recent echocardiogram shows EF mildly down to 45 to 50%, echocardiogram obtained in the setting of community-acquired pneumonia.          - -C/W IV lasix '80mg'$  BID,  -WEANING 02 -Dr. Johnsie Cancel recommended left and right heart cath, planned cath board for Tue 10:30AM with Dr. Tamala Julian (cath is closed on Mon  for [facility] day) -now on heparin gtt   2 Hospital-acquired pneumonia: Receiving IV Rocephin and azithromycin.   3  Acute respiratory failure requiring intubation: Likely related to mild heart failure and also pneumonia, now extubated doing well   4 Pulmonary hypertension: Echocardiogram in April 2015 demonstrated PA peak pressure 53 mmHg, however  this was obtained in the setting of acute PE.  Echo in August 2019 did not mention pulmonary artery pressure.  Recent echocardiogram obtained a week ago demonstrated RV systolic pressure 61 mmHg, however this was obtained in the setting of community-acquired pneumonia.  She has a history of obstructive sleep apnea.     5 Hypertension: c/w lasix   6 Recurrent DVT/PE: On lifelong Coumadin, holding currently, on hep gtt   7 History of anemia secondary to leiomyoma/menorrhagia: monitor cbc on hep gtt, slight drop in hgb, no evidence of bleeding , follow cbc in am   Code Status: Full code  Family Communication: None at bedside  Disposition Plan: Likely will discharge to home once cardiology signs of     Antimicrobials: Rocephin, completed Azithromycin, completed  DVT prophylaxis: Heparin drip  Status is: Inpatient Inpatient status.  The patient requires at least 2 midnights for further evaluation and treatment of the condition.    Objective: Vitals:   06/17/21 2048 06/18/21 0531 06/18/21 0942 06/18/21 1400  BP: 105/60 112/82 105/61 110/80  Pulse: 85 67 81 83  Resp:   18 18  Temp: 98.9 F (37.2 C) 98.7 F (37.1 C) 98 F (36.7 C) 98.2 F (36.8 C)  TempSrc: Oral Oral Oral Oral  SpO2: 100% 100% 100% 100%  Weight:      Height:        Intake/Output Summary (Last 24 hours) at 06/18/2021 1823 Last data filed at 06/18/2021 1221 Gross per 24 hour  Intake 569.04 ml  Output 1200 ml  Net -630.96 ml   Filed Weights   06/15/21 0500 06/16/21 0500 06/17/21 0447  Weight: 123.8 kg 122.5 kg 123.5 kg    Exam:  General: 46 y.o. year-old female well developed well nourished in no acute distress.  Alert and oriented x3. Cardiovascular: Regular rate and rhythm with no rubs or gallops.  No thyromegaly or JVD noted.   Respiratory: Clear to auscultation with no wheezes or rales. Good inspiratory effort. Abdomen: Soft nontender nondistended with normal bowel sounds x4  quadrants. Musculoskeletal: No lower extremity edema. 2/4 pulses in all 4 extremities. Skin: No ulcerative lesions noted or rashes, Psychiatry: Mood is appropriate for condition and setting   Data Reviewed: CBC: Recent Labs  Lab 06/13/21 0249 06/14/21 0302 06/15/21 0300 06/16/21 0531 06/17/21 0300 06/18/21 0442  WBC 7.4 6.7 8.6 9.9 8.3 7.5  NEUTROABS 5.2  --   --   --   --   --   HGB 7.9* 8.5* 8.3* 9.3* 8.2* 8.2*  HCT 28.5* 31.1* 30.6* 33.7* 30.6* 31.8*  MCV 71.6* 71.7* 70.8* 72.0* 72.3* 76.3*  PLT 264 247 272 383 348 979   Basic Metabolic Panel: Recent Labs  Lab 06/12/21 2011 06/13/21 0249 06/13/21 1027 06/13/21 1701 06/14/21 0302 06/14/21 1722 06/14/21 2227 06/15/21 1019  NA  --  138   < > 140 141 140 142 142  K  --  3.9   < > 4.5 3.8 4.0 3.8 3.7  CL  --  107   < > 105 104 104 103 103  CO2  --  25   < > '29 30 28 '$ 32 30  GLUCOSE  --  88   < > 83 79 100* 100* 109*  BUN  --  27*   < > 32* 31* 27* 25* 24*  CREATININE  --  0.81   < > 0.95 0.86 0.80 0.83 0.67  CALCIUM  --  8.9   < > 8.9 8.6* 8.9 9.2 9.5  MG 2.2 2.2  --  2.5* 2.3  --   --   --   PHOS 3.7 3.8  --  4.4 4.8*  --   --   --    < > = values in this interval not displayed.   GFR: Estimated Creatinine Clearance: 128.2 mL/min (by C-G formula based on SCr of 0.67 mg/dL). Liver Function Tests: Recent Labs  Lab 06/12/21 1409 06/13/21 0249 06/14/21 2227  AST '22 18 26  '$ ALT '17 17 23  '$ ALKPHOS 38 36* 43  BILITOT 0.4 0.5 0.7  PROT 7.2 6.7 7.7  ALBUMIN 3.0* 2.8* 3.1*   No results for input(s): LIPASE, AMYLASE in the last 168 hours. No results for input(s): AMMONIA in the last 168 hours. Coagulation Profile: Recent Labs  Lab 06/14/21 0302 06/15/21 0300 06/16/21 0531 06/17/21 0300 06/18/21 0442  INR 2.4* 1.5* 1.3* 1.2 1.2   Cardiac Enzymes: No results for input(s): CKTOTAL, CKMB, CKMBINDEX, TROPONINI in the last 168 hours. BNP (last 3 results) No results for input(s): PROBNP in the last 8760  hours. HbA1C: No results for input(s): HGBA1C in the last 72 hours. CBG: Recent Labs  Lab 06/13/21 2008 06/13/21 2338 06/14/21 0344 06/14/21 0813 06/14/21 1152  GLUCAP 84 109* 99 92 133*   Lipid Profile: Recent Labs    06/16/21 0531  TRIG 52   Thyroid Function Tests: No results for input(s): TSH, T4TOTAL, FREET4, T3FREE, THYROIDAB in the last 72 hours. Anemia Panel: No results for input(s): VITAMINB12, FOLATE, FERRITIN, TIBC, IRON, RETICCTPCT in the last 72 hours. Urine analysis:    Component Value Date/Time   COLORURINE YELLOW 10/23/2016 1303   APPEARANCEUR HAZY (A) 10/23/2016 1303   APPEARANCEUR Cloudy (A) 07/21/2014 1326   LABSPEC 1.024 10/23/2016 1303   PHURINE 5.0 10/23/2016 1303   GLUCOSEU NEGATIVE 10/23/2016 1303   HGBUR NEGATIVE 10/23/2016 1303   BILIRUBINUR NEGATIVE 10/23/2016 1303   BILIRUBINUR Negative 07/21/2014 1326   KETONESUR NEGATIVE 10/23/2016 1303   PROTEINUR NEGATIVE 10/23/2016 1303   UROBILINOGEN 0.2 06/30/2014 0910   NITRITE NEGATIVE 10/23/2016 1303   LEUKOCYTESUR NEGATIVE 10/23/2016 1303   LEUKOCYTESUR Negative 07/21/2014 1326   Sepsis Labs: '@LABRCNTIP'$ (procalcitonin:4,lacticidven:4)  ) Recent Results (from the past 240 hour(s))  MRSA Next Gen by PCR, Nasal     Status: None   Collection Time: 06/12/21  1:11 PM   Specimen: Nasal Mucosa; Nasal Swab  Result Value Ref Range Status   MRSA by PCR Next Gen NOT DETECTED NOT DETECTED Final    Comment: (NOTE) The GeneXpert MRSA Assay (FDA approved for NASAL specimens only), is one component of a comprehensive MRSA colonization surveillance program. It is not intended to diagnose MRSA infection nor to guide or monitor treatment for MRSA infections. Test performance is not FDA approved in patients less than 48 years old. Performed at [facility], [place] [address] [place], [place] [address]   Respiratory (~20 pathogens) panel by PCR     Status: None   Collection Time: 06/12/21   2:37 PM   Specimen: Nasopharyngeal Swab; Respiratory  Result Value Ref Range Status   Adenovirus NOT DETECTED NOT DETECTED Final   Coronavirus 229E NOT DETECTED  NOT DETECTED Final    Comment: (NOTE) The Coronavirus on the Respiratory Panel, DOES NOT test for the novel  Coronavirus (2019 nCoV)    Coronavirus HKU1 NOT DETECTED NOT DETECTED Final   Coronavirus NL63 NOT DETECTED NOT DETECTED Final   Coronavirus OC43 NOT DETECTED NOT DETECTED Final   Metapneumovirus NOT DETECTED NOT DETECTED Final   Rhinovirus / Enterovirus NOT DETECTED NOT DETECTED Final   Influenza A NOT DETECTED NOT DETECTED Final   Influenza B NOT DETECTED NOT DETECTED Final   Parainfluenza Virus 1 NOT DETECTED NOT DETECTED Final   Parainfluenza Virus 2 NOT DETECTED NOT DETECTED Final   Parainfluenza Virus 3 NOT DETECTED NOT DETECTED Final   Parainfluenza Virus 4 NOT DETECTED NOT DETECTED Final   Respiratory Syncytial Virus NOT DETECTED NOT DETECTED Final   Bordetella pertussis NOT DETECTED NOT DETECTED Final   Bordetella Parapertussis NOT DETECTED NOT DETECTED Final   Chlamydophila pneumoniae NOT DETECTED NOT DETECTED Final   Mycoplasma pneumoniae NOT DETECTED NOT DETECTED Final    Comment: Performed at [facility], [place] [address] [place], [place] [address]  Culture, Respiratory w Gram Stain     Status: None   Collection Time: 06/12/21  3:23 PM   Specimen: Tracheal Aspirate; Respiratory  Result Value Ref Range Status   Specimen Description   Final    TRACHEAL ASPIRATE Performed at [place] [address], [place], [place] [address]    Special Requests   Final    NONE Performed at [facility], [place] [address], [place], [place] [address]    Gram Stain   Final    FEW WBC PRESENT, PREDOMINANTLY PMN NO ORGANISMS SEEN    Culture   Final    RARE Normal respiratory flora-no Staph aureus or Pseudomonas seen Performed at [facility] [address],  [place], [place] [address]    Report Status 06/17/2021 FINAL  Final      Studies: No results found.  Scheduled Meds:  Chlorhexidine Gluconate Cloth  6 each Topical Q0600   furosemide  80 mg Intravenous BID    Continuous Infusions:  heparin 2,250 Units/hr (06/18/21 1426)     LOS: 6 days     Kayleen Memos, MD [facility] Pager [phone]  If 7PM-7AM, please contact night-coverage [email or web address] Password [facility] 06/18/2021, 6:23 PM

## 2021-06-18 NOTE — H&P (Signed)
Signal of severe pulmonary hypertension by echo and a patient with DVT/PE prior history, obstructive sleep apnea, mild generalized reduction in LV systolic function, who was admitted to the hospital with community-acquired pneumonia. Hemodynamic assessment of pulmonary hypertension and rule out of coronary artery disease seem to be major indications.

## 2021-06-18 NOTE — Progress Notes (Signed)
Speech Language Pathology Treatment: Dysphagia  Patient Details Name: [name] [name] MRN: [id number] DOB: [birth year] Today's Date: 06/18/2021 Time: 2694-8546 SLP Time Calculation (min) (ACUTE ONLY): 14 min  Assessment / Plan / Recommendation Clinical Impression  Pt sitting upright in chair, reports improved voice and swallow function.  She graded voice 98/100  = with 100 being normal.  Able to verbalize all swallow precautions including clinical reasoning provided on Saturday, May 27th, without cues.  She reports resolution of odynophagia and denies coughing with po intake.  3 ounce [place] water challenge conducted with pt easily passing- no increased work of breathing, nor indication of dysphagia.  Swallow clinically judged to be strong and timely. Voice remains minimally breathy/raspy with intermittent breaks but much improved.   Provided pt with rainbow passage for her to record on her phone - approx every 5 days to assess for improvement.  If dysphonia does not resolve within a few months,  pt may benefit from ENT referral as an OP - however voice continues to improve significantly.  No SLP follow up indicated.Thanks.    HPI HPI: 46yo female admitted 06/12/21 with progressive dyspnea. Pt intubated in ED emergency. Extubated 06/14/21. PMH: CHF, OSA on CPAP, DVT/PE, HTN, iron deficiency anemia, obesity. CXR = Bilateral perihilar and basilar opacities are noted concerning for pulmonary edema or possibly infiltrates. Pt was placed on a modified diet after BSE, was advanced on Saturday, May 27th and now - follow up to assess for advancement tolerance indicated.      SLP Plan  Continue with current plan of care      Recommendations for follow up therapy are one component of a multi-disciplinary discharge planning process, led by the attending physician.  Recommendations may be updated based on patient status, additional functional criteria and insurance authorization.    Recommendations  Diet  recommendations: Regular;Thin liquid Liquids provided via: Cup;Straw Medication Administration: Other (Comment) (as tolerated) Supervision: Patient able to self feed Compensations: Slow rate;Small sips/bites Postural Changes and/or Swallow Maneuvers: Seated upright 90 degrees;Out of bed for meals;Upright 30-60 min after meal                Oral Care Recommendations: Oral care BID Follow Up Recommendations: No SLP follow up Assistance recommended at discharge: None SLP Visit Diagnosis: Dysphagia, unspecified (R13.10) Plan: Continue with current plan of care         Kathleen Lime, MS [place] Office [phone] Pager [phone]   Macario Golds  06/18/2021, 1:51 PM

## 2021-06-18 NOTE — Progress Notes (Signed)
[place] for Heparin while Warfarin on hold Indication: Hx of PE/DVT  Allergies  Allergen Reactions   Sulfa Antibiotics Itching, Swelling and Rash    Lips swelling    Patient Measurements: Height: '5\' 10"'$  (177.8 cm) (per ECHO) Weight: 123.5 kg (272 lb 4.3 oz) IBW/kg (Calculated) : 68.5 Heparin Dosing Weight: actual  Vital Signs: Temp: 98.7 F (37.1 C) (05/29 0531) Temp Source: Oral (05/29 0531) BP: 112/82 (05/29 0531) Pulse Rate: 67 (05/29 0531)  Labs: Recent Labs    06/15/21 1019 06/15/21 1758 06/16/21 0531 06/16/21 1158 06/17/21 0300 06/17/21 1056 06/17/21 1717 06/18/21 0442  HGB  --    < > 9.3*  --  8.2*  --   --  8.2*  HCT  --   --  33.7*  --  30.6*  --   --  31.8*  PLT  --   --  383  --  348  --   --  323  LABPROT  --   --  16.2*  --  15.0  --   --  14.9  INR  --   --  1.3*  --  1.2  --   --  1.2  HEPARINUNFRC  --    < > 0.38   < > 0.22* 0.46 0.55 0.43  CREATININE 0.67  --   --   --   --   --   --   --    < > = values in this interval not displayed.     Estimated Creatinine Clearance: 128.2 mL/min (by C-G formula based on SCr of 0.67 mg/dL).   Medical History:   Medications:  Scheduled:   Chlorhexidine Gluconate Cloth  6 each Topical Q0600   furosemide  80 mg Intravenous BID   Infusions:   heparin 2,250 Units/hr (06/18/21 0203)   PRN: ondansetron (ZOFRAN) IV  Per Coumadin Clinic notes- she takes '10mg'$  daily except '15mg'$  on Tues and Sat  Assessment: 46 yo F on warfarin PTA for hx DVT/PE.   INR was supra-therapeutic (3.3) on admission so dose was held; low doses resumed 5/24-25.  Now warfarin is on hold and Pharmacy consulted to dose IV heparin in preparation for cardiac cath on Tuesday 5/30.  She has chronic anemia and is currently menstruating.   Today, 06/18/2021 HL 0.43 therapeutic on 2250 units/hr Hgb 8.2, Plts WNL No significant interruptions reported Menstrual flow noted to be lightening  Goal of  Therapy:  Heparin level 0.3-0.7 units/ml Monitor platelets by anticoagulation protocol: Yes   Plan:  Continue heparin drip to 2250 units/hr Daily heparin level and CBC Continue to monitor for signs/symptoms of bleeding  Dolly Rias RPh 06/18/2021, 6:04 AM

## 2021-06-18 NOTE — H&P (View-Only) (Signed)
Progress Note  Patient Name: [name] [name] Date of Encounter: 06/18/2021  [facility] Cardiologist: Carlyle Dolly, MD   Subjective   Back to near baseline Ok to transfer to non telemetry floor   Inpatient Medications    Scheduled Meds:  Chlorhexidine Gluconate Cloth  6 each Topical Q0600   furosemide  80 mg Intravenous BID   Continuous Infusions:  heparin 2,250 Units/hr (06/18/21 0203)   PRN Meds: ondansetron (ZOFRAN) IV   Vital Signs    Vitals:   06/17/21 1100 06/17/21 1144 06/17/21 2048 06/18/21 0531  BP:  104/64 105/60 112/82  Pulse: (!) 109 80 85 67  Resp: 20 19    Temp:  98.7 F (37.1 C) 98.9 F (37.2 C) 98.7 F (37.1 C)  TempSrc:  Oral Oral Oral  SpO2: (!) 88% 100% 100% 100%  Weight:      Height:        Intake/Output Summary (Last 24 hours) at 06/18/2021 0850 Last data filed at 06/18/2021 0300 Gross per 24 hour  Intake 1367.82 ml  Output --  Net 1367.82 ml      06/17/2021    4:47 AM 06/16/2021    5:00 AM 06/15/2021    5:00 AM  Last 3 Weights  Weight (lbs) 272 lb 4.3 oz 270 lb 1 oz 272 lb 14.9 oz  Weight (kg) 123.5 kg 122.5 kg 123.8 kg      Telemetry    NSR with HR 60-90s - Personally Reviewed  ECG    NSR with LAFB - Personally Reviewed  Physical Exam   GEN: No acute distress.   Neck: No JVD Cardiac: RRR, no murmurs, rubs, or gallops.  Respiratory: Clear to auscultation bilaterally. GI: Soft, nontender, non-distended  MS: No edema; No deformity. Neuro:  Nonfocal  Psych: Normal affect   Labs    High Sensitivity Troponin:   Recent Labs  Lab 06/04/21 2321 06/12/21 1409  TROPONINIHS 13 8     Chemistry Recent Labs  Lab 06/12/21 1409 06/12/21 2011 06/13/21 0249 06/13/21 1027 06/13/21 1701 06/14/21 0302 06/14/21 1722 06/14/21 2227 06/15/21 1019  NA 135  --  138   < > 140 141 140 142 142  K 4.1  --  3.9   < > 4.5 3.8 4.0 3.8 3.7  CL 104  --  107   < > 105 104 104 103 103  CO2 23  --  25   < > '29 30 28 '$ 32 30   GLUCOSE 82  --  88   < > 83 79 100* 100* 109*  BUN 23*  --  27*   < > 32* 31* 27* 25* 24*  CREATININE 0.95  --  0.81   < > 0.95 0.86 0.80 0.83 0.67  CALCIUM 8.8*  --  8.9   < > 8.9 8.6* 8.9 9.2 9.5  MG  --    < > 2.2  --  2.5* 2.3  --   --   --   PROT 7.2  --  6.7  --   --   --   --  7.7  --   ALBUMIN 3.0*  --  2.8*  --   --   --   --  3.1*  --   AST 22  --  18  --   --   --   --  26  --   ALT 17  --  17  --   --   --   --  23  --   ALKPHOS 38  --  36*  --   --   --   --  43  --   BILITOT 0.4  --  0.5  --   --   --   --  0.7  --   GFRNONAA >60  --  >60   < > >60 >60 >60 >60 >60  ANIONGAP 8  --  6   < > '6 7 8 7 9   '$ < > = values in this interval not displayed.    Lipids  Recent Labs  Lab 06/16/21 0531  TRIG 52    Hematology Recent Labs  Lab 06/16/21 0531 06/17/21 0300 06/18/21 0442  WBC 9.9 8.3 7.5  RBC 4.68 4.23 4.17  HGB 9.3* 8.2* 8.2*  HCT 33.7* 30.6* 31.8*  MCV 72.0* 72.3* 76.3*  MCH 19.9* 19.4* 19.7*  MCHC 27.6* 26.8* 25.8*  RDW 26.5* 26.0* 26.3*  PLT 383 348 323   Thyroid No results for input(s): TSH, FREET4 in the last 168 hours.  BNP Recent Labs  Lab 06/12/21 1409  BNP 293.6*    DDimer No results for input(s): DDIMER in the last 168 hours.   Radiology    No results found.  Cardiac Studies   Echo 06/05/2021 1. Left ventricular ejection fraction, by estimation, is 45 to 50%. The  left ventricle has mildly decreased function. The left ventricle  demonstrates global hypokinesis. The left ventricular internal cavity size  was moderately dilated. There is mild  left ventricular hypertrophy. Left ventricular diastolic parameters are  indeterminate.   2. Right ventricular systolic function is normal. The right ventricular  size is normal. There is severely elevated pulmonary artery systolic  pressure.   3. Left atrial size was severely dilated.   4. Right atrial size was mildly dilated.   5. The mitral valve is abnormal. Mild mitral valve regurgitation.  No  evidence of mitral stenosis.   6. The tricuspid valve is abnormal.   7. The aortic valve is tricuspid. Aortic valve regurgitation is not  visualized. No aortic stenosis is present.   8. The inferior vena cava is normal in size with greater than 50%  respiratory variability, suggesting right atrial pressure of 3 mmHg.   Patient Profile     46 y.o. 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 Erin Fulling.  Assessment & Plan    Acute on chronic combined systolic and diastolic heart failure             -Echocardiogram 06/05/21 shows EF mildly down to 45 to 50%, echocardiogram obtained in the setting of community-acquired pneumonia.              -right and left cath Tuesday ( Monday is Holiday ) Continue with iv lasix for diuresis Will start low dose Entresto    Hospital-acquired pneumonia: Receiving IV Rocephin and azithromycin.   Acute respiratory failure requiring intubation: Likely related to mild heart failure and also pneumonia   Pulmonary hypertension: Echocardiogram in April 2015 demonstrated PA peak pressure 53 mmHg, however this was obtained in the setting of acute PE.  Echo in August 2019 did not mention pulmonary artery pressure.  Echocardiogram 06/05/21  demonstrated RV systolic pressure 61 mmHg, however this was obtained in the setting of community-acquired pneumonia.  right  heart cath Tuesday 10:30 with Dr Tamala Julian   Hypertension   Recurrent DVT/PE: On lifelong Coumadin currently covering with heparin resume coumadin post cath    History of anemia secondary to leiomyoma/menorrhagia Hct 31.8       For questions or updates, please contact [place] Please consult [email or web address] for contact info under        Signed, Jenkins Rouge, MD  06/18/2021, 8:50 AM

## 2021-06-18 NOTE — Progress Notes (Signed)
Progress Note  Patient Name: [name] [name] Date of Encounter: 06/18/2021  [facility] Cardiologist: Carlyle Dolly, MD   Subjective   Back to near baseline Ok to transfer to non telemetry floor   Inpatient Medications    Scheduled Meds:  Chlorhexidine Gluconate Cloth  6 each Topical Q0600   furosemide  80 mg Intravenous BID   Continuous Infusions:  heparin 2,250 Units/hr (06/18/21 0203)   PRN Meds: ondansetron (ZOFRAN) IV   Vital Signs    Vitals:   06/17/21 1100 06/17/21 1144 06/17/21 2048 06/18/21 0531  BP:  104/64 105/60 112/82  Pulse: (!) 109 80 85 67  Resp: 20 19    Temp:  98.7 F (37.1 C) 98.9 F (37.2 C) 98.7 F (37.1 C)  TempSrc:  Oral Oral Oral  SpO2: (!) 88% 100% 100% 100%  Weight:      Height:        Intake/Output Summary (Last 24 hours) at 06/18/2021 0850 Last data filed at 06/18/2021 0300 Gross per 24 hour  Intake 1367.82 ml  Output --  Net 1367.82 ml      06/17/2021    4:47 AM 06/16/2021    5:00 AM 06/15/2021    5:00 AM  Last 3 Weights  Weight (lbs) 272 lb 4.3 oz 270 lb 1 oz 272 lb 14.9 oz  Weight (kg) 123.5 kg 122.5 kg 123.8 kg      Telemetry    NSR with HR 60-90s - Personally Reviewed  ECG    NSR with LAFB - Personally Reviewed  Physical Exam   GEN: No acute distress.   Neck: No JVD Cardiac: RRR, no murmurs, rubs, or gallops.  Respiratory: Clear to auscultation bilaterally. GI: Soft, nontender, non-distended  MS: No edema; No deformity. Neuro:  Nonfocal  Psych: Normal affect   Labs    High Sensitivity Troponin:   Recent Labs  Lab 06/04/21 2321 06/12/21 1409  TROPONINIHS 13 8     Chemistry Recent Labs  Lab 06/12/21 1409 06/12/21 2011 06/13/21 0249 06/13/21 1027 06/13/21 1701 06/14/21 0302 06/14/21 1722 06/14/21 2227 06/15/21 1019  NA 135  --  138   < > 140 141 140 142 142  K 4.1  --  3.9   < > 4.5 3.8 4.0 3.8 3.7  CL 104  --  107   < > 105 104 104 103 103  CO2 23  --  25   < > '29 30 28 '$ 32 30   GLUCOSE 82  --  88   < > 83 79 100* 100* 109*  BUN 23*  --  27*   < > 32* 31* 27* 25* 24*  CREATININE 0.95  --  0.81   < > 0.95 0.86 0.80 0.83 0.67  CALCIUM 8.8*  --  8.9   < > 8.9 8.6* 8.9 9.2 9.5  MG  --    < > 2.2  --  2.5* 2.3  --   --   --   PROT 7.2  --  6.7  --   --   --   --  7.7  --   ALBUMIN 3.0*  --  2.8*  --   --   --   --  3.1*  --   AST 22  --  18  --   --   --   --  26  --   ALT 17  --  17  --   --   --   --  23  --   ALKPHOS 38  --  36*  --   --   --   --  43  --   BILITOT 0.4  --  0.5  --   --   --   --  0.7  --   GFRNONAA >60  --  >60   < > >60 >60 >60 >60 >60  ANIONGAP 8  --  6   < > '6 7 8 7 9   '$ < > = values in this interval not displayed.    Lipids  Recent Labs  Lab 06/16/21 0531  TRIG 52    Hematology Recent Labs  Lab 06/16/21 0531 06/17/21 0300 06/18/21 0442  WBC 9.9 8.3 7.5  RBC 4.68 4.23 4.17  HGB 9.3* 8.2* 8.2*  HCT 33.7* 30.6* 31.8*  MCV 72.0* 72.3* 76.3*  MCH 19.9* 19.4* 19.7*  MCHC 27.6* 26.8* 25.8*  RDW 26.5* 26.0* 26.3*  PLT 383 348 323   Thyroid No results for input(s): TSH, FREET4 in the last 168 hours.  BNP Recent Labs  Lab 06/12/21 1409  BNP 293.6*    DDimer No results for input(s): DDIMER in the last 168 hours.   Radiology    No results found.  Cardiac Studies   Echo 06/05/2021 1. Left ventricular ejection fraction, by estimation, is 45 to 50%. The  left ventricle has mildly decreased function. The left ventricle  demonstrates global hypokinesis. The left ventricular internal cavity size  was moderately dilated. There is mild  left ventricular hypertrophy. Left ventricular diastolic parameters are  indeterminate.   2. Right ventricular systolic function is normal. The right ventricular  size is normal. There is severely elevated pulmonary artery systolic  pressure.   3. Left atrial size was severely dilated.   4. Right atrial size was mildly dilated.   5. The mitral valve is abnormal. Mild mitral valve regurgitation.  No  evidence of mitral stenosis.   6. The tricuspid valve is abnormal.   7. The aortic valve is tricuspid. Aortic valve regurgitation is not  visualized. No aortic stenosis is present.   8. The inferior vena cava is normal in size with greater than 50%  respiratory variability, suggesting right atrial pressure of 3 mmHg.   Patient Profile     46 y.o. 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 Erin Fulling.  Assessment & Plan    Acute on chronic combined systolic and diastolic heart failure             -Echocardiogram 06/05/21 shows EF mildly down to 45 to 50%, echocardiogram obtained in the setting of community-acquired pneumonia.              -right and left cath Tuesday ( Monday is Holiday ) Continue with iv lasix for diuresis Will start low dose Entresto    Hospital-acquired pneumonia: Receiving IV Rocephin and azithromycin.   Acute respiratory failure requiring intubation: Likely related to mild heart failure and also pneumonia   Pulmonary hypertension: Echocardiogram in April 2015 demonstrated PA peak pressure 53 mmHg, however this was obtained in the setting of acute PE.  Echo in August 2019 did not mention pulmonary artery pressure.  Echocardiogram 06/05/21  demonstrated RV systolic pressure 61 mmHg, however this was obtained in the setting of community-acquired pneumonia.  right  heart cath Tuesday 10:30 with Dr Tamala Julian   Hypertension   Recurrent DVT/PE: On lifelong Coumadin currently covering with heparin resume coumadin post cath    History of anemia secondary to leiomyoma/menorrhagia Hct 31.8       For questions or updates, please contact [place] Please consult [email or web address] for contact info under        Signed, Jenkins Rouge, MD  06/18/2021, 8:50 AM

## 2021-06-18 NOTE — Plan of Care (Signed)
Patient ID: [name] [name], female   DOB: 1975-09-02, 46 y.o.   MRN: [id number]  Problem: Education: Goal: Knowledge of General Education information will improve Description: Including pain rating scale, medication(s)/side effects and non-pharmacologic comfort measures Outcome: Progressing   Problem: Health Behavior/Discharge Planning: Goal: Ability to manage health-related needs will improve Outcome: Progressing   Problem: Clinical Measurements: Goal: Ability to maintain clinical measurements within normal limits will improve Outcome: Progressing Goal: Will remain free from infection Outcome: Progressing Goal: Diagnostic test results will improve Outcome: Progressing Goal: Respiratory complications will improve Outcome: Progressing Goal: Cardiovascular complication will be avoided Outcome: Progressing   Problem: Activity: Goal: Risk for activity intolerance will decrease Outcome: Progressing   Problem: Nutrition: Goal: Adequate nutrition will be maintained Outcome: Progressing   Problem: Coping: Goal: Level of anxiety will decrease Outcome: Progressing   Problem: Elimination: Goal: Will not experience complications related to bowel motility Outcome: Progressing Goal: Will not experience complications related to urinary retention Outcome: Progressing   Problem: Pain Managment: Goal: General experience of comfort will improve Outcome: Progressing   Problem: Safety: Goal: Ability to remain free from injury will improve Outcome: Progressing   Problem: Skin Integrity: Goal: Risk for impaired skin integrity will decrease Outcome: Progressing   Problem: Education: Goal: Ability to describe self-care measures that may prevent or decrease complications (Diabetes Survival Skills Education) will improve Outcome: Progressing Goal: Individualized Educational Video(s) Outcome: Progressing   Problem: Coping: Goal: Ability to adjust to condition or change in health will  improve Outcome: Progressing   Problem: Fluid Volume: Goal: Ability to maintain a balanced intake and output will improve Outcome: Progressing   Problem: Health Behavior/Discharge Planning: Goal: Ability to identify and utilize available resources and services will improve Outcome: Progressing Goal: Ability to manage health-related needs will improve Outcome: Progressing   Problem: Metabolic: Goal: Ability to maintain appropriate glucose levels will improve Outcome: Progressing   Problem: Nutritional: Goal: Maintenance of adequate nutrition will improve Outcome: Progressing Goal: Progress toward achieving an optimal weight will improve Outcome: Progressing   Problem: Skin Integrity: Goal: Risk for impaired skin integrity will decrease Outcome: Progressing   Problem: Tissue Perfusion: Goal: Adequacy of tissue perfusion will improve Outcome: Progressing    Haydee Salter, RN

## 2021-06-19 ENCOUNTER — Inpatient Hospital Stay (HOSPITAL_COMMUNITY)
Admission: AD | Disposition: A | Discharge status: Home / Self Care | Payer: Self-pay | Source: Other Acute Inpatient Hospital | Attending: Internal Medicine

## 2021-06-19 DIAGNOSIS — I5023 Acute on chronic systolic (congestive) heart failure: Secondary | ICD-10-CM | POA: Diagnosis not present

## 2021-06-19 DIAGNOSIS — I5033 Acute on chronic diastolic (congestive) heart failure: Secondary | ICD-10-CM | POA: Diagnosis not present

## 2021-06-19 DIAGNOSIS — I2510 Atherosclerotic heart disease of native coronary artery without angina pectoris: Secondary | ICD-10-CM | POA: Diagnosis not present

## 2021-06-19 HISTORY — PX: RIGHT/LEFT HEART CATH AND CORONARY ANGIOGRAPHY: CATH118266

## 2021-06-19 LAB — POCT I-STAT 7, (LYTES, BLD GAS, ICA,H+H)
Acid-Base Excess: 3 mmol/L — ABNORMAL HIGH (ref 0.0–2.0)
Bicarbonate: 27.9 mmol/L (ref 20.0–28.0)
Calcium, Ion: 1.22 mmol/L (ref 1.15–1.40)
HCT: 31 % — ABNORMAL LOW (ref 36.0–46.0)
Hemoglobin: 10.5 g/dL — ABNORMAL LOW (ref 12.0–15.0)
O2 Saturation: 96 %
Potassium: 3.4 mmol/L — ABNORMAL LOW (ref 3.5–5.1)
Sodium: 134 mmol/L — ABNORMAL LOW (ref 135–145)
TCO2: 29 mmol/L (ref 22–32)
pCO2 arterial: 41.7 mmHg (ref 32–48)
pH, Arterial: 7.433 (ref 7.35–7.45)
pO2, Arterial: 80 mmHg — ABNORMAL LOW (ref 83–108)

## 2021-06-19 LAB — POCT I-STAT EG7
Acid-Base Excess: 6 mmol/L — ABNORMAL HIGH (ref 0.0–2.0)
Acid-Base Excess: 5 mmol/L — ABNORMAL HIGH (ref 0.0–2.0)
Acid-Base Excess: 5 mmol/L — ABNORMAL HIGH (ref 0.0–2.0)
Bicarbonate: 30.8 mmol/L — ABNORMAL HIGH (ref 20.0–28.0)
Bicarbonate: 29.5 mmol/L — ABNORMAL HIGH (ref 20.0–28.0)
Bicarbonate: 30.2 mmol/L — ABNORMAL HIGH (ref 20.0–28.0)
Calcium, Ion: 1.24 mmol/L (ref 1.15–1.40)
Calcium, Ion: 1.24 mmol/L (ref 1.15–1.40)
Calcium, Ion: 1.26 mmol/L (ref 1.15–1.40)
HCT: 31 % — ABNORMAL LOW (ref 36.0–46.0)
HCT: 30 % — ABNORMAL LOW (ref 36.0–46.0)
HCT: 31 % — ABNORMAL LOW (ref 36.0–46.0)
Hemoglobin: 10.5 g/dL — ABNORMAL LOW (ref 12.0–15.0)
Hemoglobin: 10.2 g/dL — ABNORMAL LOW (ref 12.0–15.0)
Hemoglobin: 10.5 g/dL — ABNORMAL LOW (ref 12.0–15.0)
O2 Saturation: 60 %
O2 Saturation: 59 %
O2 Saturation: 64 %
Potassium: 3.5 mmol/L (ref 3.5–5.1)
Potassium: 3.5 mmol/L (ref 3.5–5.1)
Potassium: 3.6 mmol/L (ref 3.5–5.1)
Sodium: 139 mmol/L (ref 135–145)
Sodium: 138 mmol/L (ref 135–145)
Sodium: 139 mmol/L (ref 135–145)
TCO2: 32 mmol/L (ref 22–32)
TCO2: 31 mmol/L (ref 22–32)
TCO2: 32 mmol/L (ref 22–32)
pCO2, Ven: 45.4 mmHg (ref 44–60)
pCO2, Ven: 43.9 mmHg — ABNORMAL LOW (ref 44–60)
pCO2, Ven: 44 mmHg (ref 44–60)
pH, Ven: 7.439 — ABNORMAL HIGH (ref 7.25–7.43)
pH, Ven: 7.436 — ABNORMAL HIGH (ref 7.25–7.43)
pH, Ven: 7.445 — ABNORMAL HIGH (ref 7.25–7.43)
pO2, Ven: 30 mmHg — CL (ref 32–45)
pO2, Ven: 30 mmHg — CL (ref 32–45)
pO2, Ven: 32 mmHg (ref 32–45)

## 2021-06-19 LAB — BASIC METABOLIC PANEL
Anion gap: 8 (ref 5–15)
BUN: 16 mg/dL (ref 6–20)
CO2: 30 mmol/L (ref 22–32)
Calcium: 8.9 mg/dL (ref 8.9–10.3)
Chloride: 100 mmol/L (ref 98–111)
Creatinine, Ser: 0.68 mg/dL (ref 0.44–1.00)
GFR, Estimated: >60 mL/min (ref >60)
Glucose, Bld: 90 mg/dL (ref 70–99)
Potassium: 3.1 mmol/L — ABNORMAL LOW (ref 3.5–5.1)
Sodium: 138 mmol/L (ref 135–145)

## 2021-06-19 LAB — CBC
HCT: 29.3 % — ABNORMAL LOW (ref 36.0–46.0)
Hemoglobin: 8.3 g/dL — ABNORMAL LOW (ref 12.0–15.0)
MCH: 20.1 pg — ABNORMAL LOW (ref 26.0–34.0)
MCHC: 28.3 g/dL — ABNORMAL LOW (ref 30.0–36.0)
MCV: 71.1 fL — ABNORMAL LOW (ref 80.0–100.0)
Platelets: 331 10*3/uL (ref 150–400)
RBC: 4.12 MIL/uL (ref 3.87–5.11)
RDW: 25.9 % — ABNORMAL HIGH (ref 11.5–15.5)
WBC: 7.2 10*3/uL (ref 4.0–10.5)
nRBC: 0 % (ref 0.0–0.2)

## 2021-06-19 LAB — HEPARIN LEVEL (UNFRACTIONATED): Heparin Unfractionated: 0.47 IU/mL (ref 0.30–0.70)

## 2021-06-19 LAB — HCG, SERUM, QUALITATIVE: Preg, Serum: NEGATIVE

## 2021-06-19 LAB — PROTIME-INR
INR: 1.1 (ref 0.8–1.2)
Prothrombin Time: 14.1 seconds (ref 11.4–15.2)

## 2021-06-19 LAB — MAGNESIUM: Magnesium: 1.9 mg/dL (ref 1.7–2.4)

## 2021-06-19 SURGERY — RIGHT/LEFT HEART CATH AND CORONARY ANGIOGRAPHY
Anesthesia: LOCAL

## 2021-06-19 MED ORDER — ACETAMINOPHEN 325 MG PO TABS: 650.0000 mg | ORAL_TABLET | ORAL | Status: DC | PRN

## 2021-06-19 MED ORDER — POTASSIUM CHLORIDE CRYS ER 20 MEQ PO TBCR
60.0000 meq | EXTENDED_RELEASE_TABLET | Freq: Once | ORAL | Status: AC
  Administered 2021-06-19: 60 meq via ORAL
  Filled 2021-06-19: qty 3

## 2021-06-19 MED ORDER — SODIUM CHLORIDE 0.9 % IV SOLN: 250.0000 mL | INTRAVENOUS | Status: DC | PRN

## 2021-06-19 MED ORDER — POTASSIUM CHLORIDE 20 MEQ PO PACK
60.0000 meq | PACK | Freq: Once | ORAL | Status: AC
  Administered 2021-06-19: 60 meq via ORAL
  Filled 2021-06-19: qty 3

## 2021-06-19 MED ORDER — HEPARIN SODIUM (PORCINE) 1000 UNIT/ML IJ SOLN
INTRAMUSCULAR | Status: AC
  Filled 2021-06-19: qty 10

## 2021-06-19 MED ORDER — VERAPAMIL HCL 2.5 MG/ML IV SOLN
INTRAVENOUS | Status: DC | PRN
  Administered 2021-06-19: 10 mL via INTRA_ARTERIAL

## 2021-06-19 MED ORDER — ONDANSETRON HCL 4 MG/2ML IJ SOLN: 4.0000 mg | Freq: Four times a day (QID) | INTRAMUSCULAR | Status: DC | PRN

## 2021-06-19 MED ORDER — MIDAZOLAM HCL 2 MG/2ML IJ SOLN
INTRAMUSCULAR | Status: AC
  Filled 2021-06-19: qty 2

## 2021-06-19 MED ORDER — VERAPAMIL HCL 2.5 MG/ML IV SOLN
INTRAVENOUS | Status: AC
  Filled 2021-06-19: qty 2

## 2021-06-19 MED ORDER — FENTANYL CITRATE (PF) 100 MCG/2ML IJ SOLN
INTRAMUSCULAR | Status: DC | PRN
  Administered 2021-06-19 (×2): 25 ug via INTRAVENOUS

## 2021-06-19 MED ORDER — FENTANYL CITRATE (PF) 100 MCG/2ML IJ SOLN
INTRAMUSCULAR | Status: AC
  Filled 2021-06-19: qty 2

## 2021-06-19 MED ORDER — SODIUM CHLORIDE 0.9 % IV SOLN: INTRAVENOUS | Status: DC

## 2021-06-19 MED ORDER — ENOXAPARIN SODIUM 120 MG/0.8ML IJ SOSY
120.0000 mg | PREFILLED_SYRINGE | Freq: Two times a day (BID) | INTRAMUSCULAR | Status: DC
Start: 2021-06-19 — End: 2021-06-22
  Administered 2021-06-19 – 2021-06-22 (×6): 120 mg via SUBCUTANEOUS
  Filled 2021-06-19 (×7): qty 0.8

## 2021-06-19 MED ORDER — POTASSIUM CHLORIDE CRYS ER 20 MEQ PO TBCR
40.0000 meq | EXTENDED_RELEASE_TABLET | Freq: Once | ORAL | Status: DC
  Filled 2021-06-19: qty 2

## 2021-06-19 MED ORDER — HYDRALAZINE HCL 20 MG/ML IJ SOLN: 10.0000 mg | INTRAMUSCULAR | Status: AC | PRN

## 2021-06-19 MED ORDER — WARFARIN - PHARMACIST DOSING INPATIENT: Freq: Every day | Status: DC

## 2021-06-19 MED ORDER — SODIUM CHLORIDE 0.9% FLUSH: 3.0000 mL | INTRAVENOUS | Status: DC | PRN

## 2021-06-19 MED ORDER — WARFARIN SODIUM 7.5 MG PO TABS
15.0000 mg | ORAL_TABLET | Freq: Once | ORAL | Status: AC
Start: 2021-06-19 — End: 2021-06-19
  Administered 2021-06-19: 15 mg via ORAL
  Filled 2021-06-19: qty 2

## 2021-06-19 MED ORDER — ASPIRIN 81 MG PO CHEW
81.0000 mg | CHEWABLE_TABLET | ORAL | Status: AC
  Administered 2021-06-19: 81 mg via ORAL
  Filled 2021-06-19: qty 1

## 2021-06-19 MED ORDER — SODIUM CHLORIDE 0.9% FLUSH: 3.0000 mL | Freq: Two times a day (BID) | INTRAVENOUS | Status: DC

## 2021-06-19 MED ORDER — SODIUM CHLORIDE 0.9 % IV SOLN: INTRAVENOUS | Status: AC

## 2021-06-19 MED ORDER — HEPARIN (PORCINE) IN NACL 1000-0.9 UT/500ML-% IV SOLN
INTRAVENOUS | Status: AC
  Filled 2021-06-19: qty 1000

## 2021-06-19 MED ORDER — IOHEXOL 350 MG/ML SOLN
INTRAVENOUS | Status: DC | PRN
  Administered 2021-06-19: 50 mL

## 2021-06-19 MED ORDER — MIDAZOLAM HCL 2 MG/2ML IJ SOLN
INTRAMUSCULAR | Status: DC | PRN
  Administered 2021-06-19 (×2): .5 mg via INTRAVENOUS

## 2021-06-19 MED ORDER — SODIUM CHLORIDE 0.9% FLUSH
3.0000 mL | Freq: Two times a day (BID) | INTRAVENOUS | Status: DC
  Administered 2021-06-19 – 2021-06-22 (×6): 3 mL via INTRAVENOUS

## 2021-06-19 MED ORDER — LABETALOL HCL 5 MG/ML IV SOLN: 10.0000 mg | INTRAVENOUS | Status: AC | PRN

## 2021-06-19 MED ORDER — HEPARIN (PORCINE) IN NACL 1000-0.9 UT/500ML-% IV SOLN
INTRAVENOUS | Status: DC | PRN
  Administered 2021-06-19 (×2): 500 mL

## 2021-06-19 MED ORDER — HEPARIN SODIUM (PORCINE) 1000 UNIT/ML IJ SOLN
INTRAMUSCULAR | Status: DC | PRN
  Administered 2021-06-19: 4000 [IU] via INTRAVENOUS

## 2021-06-19 MED ORDER — LIDOCAINE HCL (PF) 1 % IJ SOLN
INTRAMUSCULAR | Status: AC
  Filled 2021-06-19: qty 30

## 2021-06-19 SURGICAL SUPPLY — 11 items
BAND ZEPHYR COMPRESS 30 LONG (HEMOSTASIS) ×1 IMPLANT
CATH 5FR JL3.5 JR4 ANG PIG MP (CATHETERS) ×1 IMPLANT
CATH BALLN WEDGE 5F 110CM (CATHETERS) ×1 IMPLANT
GLIDESHEATH SLEND A-KIT 6F 22G (SHEATH) ×1 IMPLANT
GUIDEWIRE INQWIRE 1.5J.035X260 (WIRE) IMPLANT
INQWIRE 1.5J .035X260CM (WIRE) ×2
KIT HEART LEFT (KITS) ×2 IMPLANT
PACK CARDIAC CATHETERIZATION (CUSTOM PROCEDURE TRAY) ×2 IMPLANT
SHEATH GLIDE SLENDER 4/5FR (SHEATH) ×1 IMPLANT
TRANSDUCER W/STOPCOCK (MISCELLANEOUS) ×2 IMPLANT
TUBING CIL FLEX 10 FLL-RA (TUBING) ×3 IMPLANT

## 2021-06-19 NOTE — Op Note (Signed)
20- 30% proximal circumflex Coronaries otherwise normal Elevated end-diastolic pressure of 31 consistent with acute on chronic combined systolic and diastolic heart failure using the echo EF of 45 to 50%. Mild pulmonary hypertension with mean pressure 30 mmHg. Mean wedge pressure 24 mmHg; pulmonary vascular resistance 0.70 Mild pulmonary hypertension, WHO group 2.  Recommend optimization of left heart failure.  Potentially needs diuresis and consideration of SGLT2 as well as ARNI therapy

## 2021-06-19 NOTE — Progress Notes (Signed)
Received back to room 1512 via stretcher/ VSS. Monitor applied. TR BAND rt wrist, completely deflated prior to pt arriving, dry dressing applied per order. No bleeding or hematoma noted. Callight within reach,enc to call for assist.

## 2021-06-19 NOTE — Progress Notes (Signed)
Received from [facility] via [facility]. Patient alert and oriented, skin warm and dry , resp even and unlabored.  Pt placed on monitor, consent signed, and IVF of NS and Heparin infusing in right arm.  Pt denis any complaint of pain or discomfort at this time. Pt waiting for cath procedure and call bell in reach.

## 2021-06-19 NOTE — Plan of Care (Signed)

## 2021-06-19 NOTE — Progress Notes (Signed)
PROGRESS NOTE  [name] [name] WKG:[id number] DOB: 11-23-1975 DOA: 06/12/2021 PCP: Carrolyn Meiers, MD  HPI/Recap of past 24 hours: [name] [name] is a 46 year old female with past medical history of hypertension, history of DVT/PE on lifelong anticoagulation, pulmonary hypertension, OSA on CPAP, history of anemia secondary to menorrhagia/leiomyoma.  Patient had a unprovoked saddle PE in 2015 and was placed on Coumadin therapy since.  Repeat CT showed resolution of the prior PE.  Echocardiogram in August 2019 showed EF 60 to 65%, no regional wall motion abnormality, normal RV.  Patient was last seen by [name] July, NP on 10/19/2020 at which time she was doing well.  She had lost significant amount of weight.  Unfortunately she had unprovoked DVT in January 2023 and was seen by [name] [name] of vascular surgery.  Patient reportedly missed 2 doses of Coumadin prior to her DVT.  CTA was negative for PE, but does reveal groundglass opacity in the mid and lower lobe left greater than right which could represent either edema versus pneumonia/pneumonitis.  She ultimately percutaneous mechanical suction thrombectomy by [name] [name] of vascular surgery.  More recently, patient was admitted from 5/16 - 5/18 due to community-acquired pneumonia and acute on chronic diastolic heart failure.  Echocardiogram obtained on 06/05/2021 demonstrated EF 45 to 50%, global hypokinesis, mild LVH, severe LAE, mild MR.  She underwent IV diuresis.  Laboratory findings at the time showed a BNP of 537.  She was found to have high-output heart failure in the setting of significant anemia.  She received 3 units of packed red blood cell.  Patient was also treated with IV Rocephin, azithromycin and Omnicef.  Discharge weight was 127.4 kg.  After her discharge, she initially felt well, however symptoms recurred 3 days later on 06/10/2021.  She was having significant nonproductive cough, orthopnea and PND symptom.  She says she was  compliant with her diuretic therapy at home.   Due to worsening shortness of breath, patient ultimately presented to [facility] ED.  She required intubation in the emergency room.  Chest x-ray showed pulmonary edema and possible pneumonia.  COVID test is negative.  She was placed on ventilator and transferred to [facility].  Since admission, she has been treated with IV Lasix and antibiotics.  Patient was ultimately extubated on 06/14/2021 and oxygen requirement has weaned down to 4 L oxygen.  Cardiology service consulted for possible heart failure with plans for heart cath on Tuesday. Transfer out of SDU/ICU Sunday.  Subjective.  Patient was seen and examined after the cath today catheterization today.  Denies any pain or shortness of breath.  Remained on room air.   Assessment/Plan: Principal Problem:   Acute on chronic diastolic CHF (congestive heart failure) (HCC) Active Problems:   Chronic pain syndrome   Aspiration pneumonia (HCC)   OSA on CPAP   Tobacco use disorder   Severe Iron deficiency anemia   Morbid obesity ([place])   History of pulmonary embolism   Acute respiratory failure with hypoxia (HCC)   Acute on chronic systolic congestive heart failure (HCC)   Pulmonary edema   Dysphagia  Acute on chronic combined systolic and diastolic heart failure -Recent echocardiogram shows EF mildly down to 45 to 50%, echocardiogram obtained in the setting of community-acquired pneumonia.   Patient had had right and left cardiac catheterization today which shows nonobstructive disease and mild pulmonary hypertension.        - -C/W IV lasix '80mg'$  BID,  -WEANING 02   Hospital-acquired  pneumonia: Receiving IV Rocephin and azithromycin.   Acute respiratory failure requiring intubation: Likely related to mild heart failure and also pneumonia, now extubated doing well, currently on room air   Pulmonary hypertension: Echocardiogram in April 2015 demonstrated PA peak pressure 53 mmHg,  however this was obtained in the setting of acute PE.  Echo in August 2019 did not mention pulmonary artery pressure.  Recent echocardiogram obtained a week ago demonstrated RV systolic pressure 61 mmHg, however this was obtained in the setting of community-acquired pneumonia.  She has a history of obstructive sleep apnea.   Right heart catheterization with mild pulmonary hypertension.   Hypertension: c/w lasix   Recurrent DVT/PE: On lifelong Coumadin, her home dose of Coumadin was held and she was placed on heparin infusion for cardiac catheterization today. -Restart Coumadin per pharmacy with Lovenox bridge    History of anemia secondary to leiomyoma/menorrhagia: monitor cbc on hep gtt, slight drop in hgb, no evidence of bleeding , follow cbc in am   Code Status: Full code  Family Communication: None at bedside  Disposition Plan: Likely will discharge to home once cardiology signs of   Antimicrobials: Rocephin, completed Azithromycin, completed  DVT prophylaxis: Heparin drip  Status is: Inpatient Inpatient status.  The patient requires at least 2 midnights for further evaluation and treatment of the condition.  Objective: Vitals:   06/19/21 1234 06/19/21 1239 06/19/21 1331 06/19/21 1453  BP: 115/63 112/69  (!) 108/55  Pulse: 68 68  82  Resp: '20 19  18  '$ Temp:    98.5 F (36.9 C)  TempSrc:    Oral  SpO2: 95% 97% 100% 100%  Weight:      Height:        Intake/Output Summary (Last 24 hours) at 06/19/2021 1528 Last data filed at 06/19/2021 0644 Gross per 24 hour  Intake 507.16 ml  Output --  Net 507.16 ml    Filed Weights   06/16/21 0500 06/17/21 0447 06/19/21 0500  Weight: 122.5 kg 123.5 kg 124.1 kg    Exam:  General.  Obese lady, in no acute distress. Pulmonary.  Lungs clear bilaterally, normal respiratory effort. CV.  Regular rate and rhythm, no JVD, rub or murmur. Abdomen.  Soft, nontender, nondistended, BS positive. CNS.  Alert and oriented .  No focal  neurologic deficit. Extremities.  No edema, no cyanosis, pulses intact and symmetrical. Psychiatry.  Judgment and insight appears normal.   Data Reviewed: CBC: Recent Labs  Lab 06/13/21 0249 06/14/21 0302 06/15/21 0300 06/16/21 0531 06/17/21 0300 06/18/21 0442 06/19/21 0520  WBC 7.4   < > 8.6 9.9 8.3 7.5 7.2  NEUTROABS 5.2  --   --   --   --   --   --   HGB 7.9*   < > 8.3* 9.3* 8.2* 8.2* 8.3*  HCT 28.5*   < > 30.6* 33.7* 30.6* 31.8* 29.3*  MCV 71.6*   < > 70.8* 72.0* 72.3* 76.3* 71.1*  PLT 264   < > 272 383 348 323 331   < > = values in this interval not displayed.    Basic Metabolic Panel: Recent Labs  Lab 06/12/21 2011 06/13/21 0249 06/13/21 1027 06/13/21 1701 06/14/21 0302 06/14/21 1722 06/14/21 2227 06/15/21 1019 06/19/21 0520  NA  --  138   < > 140 141 140 142 142 138  K  --  3.9   < > 4.5 3.8 4.0 3.8 3.7 3.1*  CL  --  107   < >  105 104 104 103 103 100  CO2  --  25   < > '29 30 28 '$ 32 30 30  GLUCOSE  --  88   < > 83 79 100* 100* 109* 90  BUN  --  27*   < > 32* 31* 27* 25* 24* 16  CREATININE  --  0.81   < > 0.95 0.86 0.80 0.83 0.67 0.68  CALCIUM  --  8.9   < > 8.9 8.6* 8.9 9.2 9.5 8.9  MG 2.2 2.2  --  2.5* 2.3  --   --   --  1.9  PHOS 3.7 3.8  --  4.4 4.8*  --   --   --   --    < > = values in this interval not displayed.    GFR: Estimated Creatinine Clearance: 128.5 mL/min (by C-G formula based on SCr of 0.68 mg/dL). Liver Function Tests: Recent Labs  Lab 06/13/21 0249 06/14/21 2227  AST 18 26  ALT 17 23  ALKPHOS 36* 43  BILITOT 0.5 0.7  PROT 6.7 7.7  ALBUMIN 2.8* 3.1*    No results for input(s): LIPASE, AMYLASE in the last 168 hours. No results for input(s): AMMONIA in the last 168 hours. Coagulation Profile: Recent Labs  Lab 06/15/21 0300 06/16/21 0531 06/17/21 0300 06/18/21 0442 06/19/21 0520  INR 1.5* 1.3* 1.2 1.2 1.1    Cardiac Enzymes: No results for input(s): CKTOTAL, CKMB, CKMBINDEX, TROPONINI in the last 168 hours. BNP (last 3  results) No results for input(s): PROBNP in the last 8760 hours. HbA1C: No results for input(s): HGBA1C in the last 72 hours. CBG: Recent Labs  Lab 06/13/21 2008 06/13/21 2338 06/14/21 0344 06/14/21 0813 06/14/21 1152  GLUCAP 84 109* 99 92 133*    Lipid Profile: No results for input(s): CHOL, HDL, LDLCALC, TRIG, CHOLHDL, LDLDIRECT in the last 72 hours.  Thyroid Function Tests: No results for input(s): TSH, T4TOTAL, FREET4, T3FREE, THYROIDAB in the last 72 hours. Anemia Panel: No results for input(s): VITAMINB12, FOLATE, FERRITIN, TIBC, IRON, RETICCTPCT in the last 72 hours. Urine analysis:    Component Value Date/Time   COLORURINE YELLOW 10/23/2016 1303   APPEARANCEUR HAZY (A) 10/23/2016 1303   APPEARANCEUR Cloudy (A) 07/21/2014 1326   LABSPEC 1.024 10/23/2016 1303   PHURINE 5.0 10/23/2016 1303   GLUCOSEU NEGATIVE 10/23/2016 1303   HGBUR NEGATIVE 10/23/2016 1303   BILIRUBINUR NEGATIVE 10/23/2016 1303   BILIRUBINUR Negative 07/21/2014 1326   KETONESUR NEGATIVE 10/23/2016 1303   PROTEINUR NEGATIVE 10/23/2016 1303   UROBILINOGEN 0.2 06/30/2014 0910   NITRITE NEGATIVE 10/23/2016 1303   LEUKOCYTESUR NEGATIVE 10/23/2016 1303   LEUKOCYTESUR Negative 07/21/2014 1326   Sepsis Labs: '@LABRCNTIP'$ (procalcitonin:4,lacticidven:4)  ) Recent Results (from the past 240 hour(s))  MRSA Next Gen by PCR, Nasal     Status: None   Collection Time: 06/12/21  1:11 PM   Specimen: Nasal Mucosa; Nasal Swab  Result Value Ref Range Status   MRSA by PCR Next Gen NOT DETECTED NOT DETECTED Final    Comment: (NOTE) The GeneXpert MRSA Assay (FDA approved for NASAL specimens only), is one component of a comprehensive MRSA colonization surveillance program. It is not intended to diagnose MRSA infection nor to guide or monitor treatment for MRSA infections. Test performance is not FDA approved in patients less than 1 years old. Performed at [facility], [place] [address], [place], [place] [address]   Respiratory (~20 pathogens) panel by PCR     Status:  None   Collection Time: 06/12/21  2:37 PM   Specimen: Nasopharyngeal Swab; Respiratory  Result Value Ref Range Status   Adenovirus NOT DETECTED NOT DETECTED Final   Coronavirus 229E NOT DETECTED NOT DETECTED Final    Comment: (NOTE) The Coronavirus on the Respiratory Panel, DOES NOT test for the novel  Coronavirus (2019 nCoV)    Coronavirus HKU1 NOT DETECTED NOT DETECTED Final   Coronavirus NL63 NOT DETECTED NOT DETECTED Final   Coronavirus OC43 NOT DETECTED NOT DETECTED Final   Metapneumovirus NOT DETECTED NOT DETECTED Final   Rhinovirus / Enterovirus NOT DETECTED NOT DETECTED Final   Influenza A NOT DETECTED NOT DETECTED Final   Influenza B NOT DETECTED NOT DETECTED Final   Parainfluenza Virus 1 NOT DETECTED NOT DETECTED Final   Parainfluenza Virus 2 NOT DETECTED NOT DETECTED Final   Parainfluenza Virus 3 NOT DETECTED NOT DETECTED Final   Parainfluenza Virus 4 NOT DETECTED NOT DETECTED Final   Respiratory Syncytial Virus NOT DETECTED NOT DETECTED Final   Bordetella pertussis NOT DETECTED NOT DETECTED Final   Bordetella Parapertussis NOT DETECTED NOT DETECTED Final   Chlamydophila pneumoniae NOT DETECTED NOT DETECTED Final   Mycoplasma pneumoniae NOT DETECTED NOT DETECTED Final    Comment: Performed at [facility], [place]. [address] [place], [place] [address]  Culture, Respiratory w Gram Stain     Status: None   Collection Time: 06/12/21  3:23 PM   Specimen: Tracheal Aspirate; Respiratory  Result Value Ref Range Status   Specimen Description   Final    TRACHEAL ASPIRATE Performed at [place] [address] [place], [place] [address]    Special Requests   Final    NONE Performed at [facility], [place] [address] [place], [place] [address]    Gram Stain   Final    FEW WBC PRESENT, PREDOMINANTLY PMN NO ORGANISMS SEEN    Culture   Final    RARE  Normal respiratory flora-no Staph aureus or Pseudomonas seen Performed at [place] [address], [place],  [address]    Report Status 06/17/2021 FINAL  Final      Studies: CARDIAC CATHETERIZATION  Result Date: 06/19/2021 CONCLUSIONS: 40 to 50% proximal right coronary narrowing. Right dominant anatomy Coronary arteries otherwise normal Significant elevation LVEDP 30 mmHg consistent with acute on chronic combined systolic and diastolic heart failure Mild pulmonary hypertension, mean pressure 30 mmHg.  WHO group II etiology based on hemodysnamics.. Capillary wedge mean 24 mmHg.  V wave to 40 mmHg suggesting some degree of mitral regurgitation. Cardiac output 8.5 L/min with index 3.57 L/min/m. Pulmonary resistance 0.7 Wood units. RECOMMENDATIONS: Care with IV fluid administration. Needs diuresis. Start heart failure therapy including ARNI and SGLT2 as tolerated.   Scheduled Meds:  Chlorhexidine Gluconate Cloth  6 each Topical Q0600   enoxaparin (LOVENOX) injection  120 mg Subcutaneous Q12H   furosemide  80 mg Intravenous BID   potassium chloride  40 mEq Oral Once   sodium chloride flush  3 mL Intravenous Q12H   warfarin  15 mg Oral ONCE-1600   Warfarin - Pharmacist Dosing Inpatient   Does not apply q1600    Continuous Infusions:  sodium chloride 50 mL/hr at 06/19/21 1505   sodium chloride       LOS: 7 days     Lorella Nimrod, MD [facility] Pager [phone]  If 7PM-7AM, please contact night-coverage [email or web address] Password [email or web address] 06/19/2021, 3:28 PM

## 2021-06-19 NOTE — Progress Notes (Addendum)
[facility] for Heparin while Warfarin on hold Indication: Hx of PE/DVT  Allergies  Allergen Reactions   Sulfa Antibiotics Itching, Swelling and Rash    Lips swelling    Patient Measurements: Height: '5\' 10"'$  (177.8 cm) (per ECHO) Weight: 124.1 kg (273 lb 9.5 oz) IBW/kg (Calculated) : 68.5 Heparin Dosing Weight: 97 kg  Vital Signs: Temp: 98.6 F (37 C) (05/30 0429) Temp Source: Oral (05/30 0429) BP: 98/65 (05/30 0429) Pulse Rate: 77 (05/30 0429)  Labs: Recent Labs    06/17/21 0300 06/17/21 1056 06/17/21 1717 06/18/21 0442 06/19/21 0520  HGB 8.2*  --   --  8.2* 8.3*  HCT 30.6*  --   --  31.8* 29.3*  PLT 348  --   --  323 331  LABPROT 15.0  --   --  14.9 14.1  INR 1.2  --   --  1.2 1.1  HEPARINUNFRC 0.22*   < > 0.55 0.43 0.47  CREATININE  --   --   --   --  0.68   < > = values in this interval not displayed.     Estimated Creatinine Clearance: 128.5 mL/min (by C-G formula based on SCr of 0.68 mg/dL).  Medications: Warfarin PTA Per Coumadin Clinic notes- she takes '10mg'$  daily except '15mg'$  on Tues and Sat  Assessment: 46 yo F on warfarin PTA for hx DVT/PE.   INR was supra-therapeutic (3.3) on admission so dose was held; low doses resumed 5/24-25.  Now warfarin is on hold and Pharmacy consulted to dose IV heparin in preparation for cardiac cath on Tuesday 5/30.  Today, 06/19/2021 HL = 0.47 remains therapeutic on heparin infusion of 2250 units/hr CBC: Hgb low but stable; Plt WNL & stable Noted that patient has chronic anemia Confirmed with RN that heparin infusing at correct rate. No interruptions. No signs of bleeding, no reports of ongoing menstruation.   Goal of Therapy:  INR 2-3 Heparin level 0.3-0.7 units/ml Monitor platelets by anticoagulation protocol: Yes   Plan:  Continue heparin drip at current rate of 2250 units/hr Daily heparin level and CBC Monitor for signs/symptoms of bleeding Follow along for ability to resume  warfarin post-cardiac cath  Lenis Noon, PharmD 06/19/21 7:29 AM  Addendum - Warfarin Dosing and post-cath anticoagulation plan  Pharmacy consulted to resume warfarin today post-cardiac cath.  INR = 1.1 is subtherapeutic after holding warfarin Will resume home dose with warfarin 15 mg PO once this evening.  Discussed anticoagulation bridge with MD. Pt remains on heparin bridge at this time, but will transition to therapeutic enoxaparin (120 mg subQ q12h) later this evening.  Lenis Noon, PharmD 06/19/21 1:53 PM  Addendum: Larena Sox therapeutic enoxaparin start time with Dr. Tamala Julian - can start 6 hours post-cath.   Lenis Noon, PharmD 06/19/21 2:58 PM

## 2021-06-19 NOTE — Interval H&P Note (Signed)
Cath Lab Visit (complete for each Cath Lab visit)  Clinical Evaluation Leading to the Procedure:   ACS: No.  Non-ACS:    Anginal Classification: CCS II  Anti-ischemic medical therapy: No Therapy  Non-Invasive Test Results: No non-invasive testing performed  Prior CABG: No previous CABG      History and Physical Interval Note:  06/19/2021 10:15 AM  [name] [name]  has presented today for surgery, with the diagnosis of acute onchronic combined systolic heart failure.  The various methods of treatment have been discussed with the patient and family. After consideration of risks, benefits and other options for treatment, the patient has consented to  Procedure(s): RIGHT/LEFT HEART CATH AND CORONARY ANGIOGRAPHY (N/A) as a surgical intervention.  The patient's history has been reviewed, patient examined, no change in status, stable for surgery.  I have reviewed the patient's chart and labs.  Questions were answered to the patient's satisfaction.     [name] [name]

## 2021-06-20 ENCOUNTER — Encounter (HOSPITAL_COMMUNITY): Payer: Self-pay | Admitting: Interventional Cardiology

## 2021-06-20 DIAGNOSIS — Z9989 Dependence on other enabling machines and devices: Secondary | ICD-10-CM | POA: Diagnosis not present

## 2021-06-20 DIAGNOSIS — G4733 Obstructive sleep apnea (adult) (pediatric): Secondary | ICD-10-CM

## 2021-06-20 DIAGNOSIS — I5023 Acute on chronic systolic (congestive) heart failure: Secondary | ICD-10-CM | POA: Diagnosis not present

## 2021-06-20 DIAGNOSIS — I5033 Acute on chronic diastolic (congestive) heart failure: Secondary | ICD-10-CM | POA: Diagnosis not present

## 2021-06-20 LAB — PROTIME-INR
INR: 1.1 (ref 0.8–1.2)
Prothrombin Time: 14.4 seconds (ref 11.4–15.2)

## 2021-06-20 LAB — CBC
HCT: 29.3 % — ABNORMAL LOW (ref 36.0–46.0)
Hemoglobin: 8.1 g/dL — ABNORMAL LOW (ref 12.0–15.0)
MCH: 19.8 pg — ABNORMAL LOW (ref 26.0–34.0)
MCHC: 27.6 g/dL — ABNORMAL LOW (ref 30.0–36.0)
MCV: 71.6 fL — ABNORMAL LOW (ref 80.0–100.0)
Platelets: 305 10*3/uL (ref 150–400)
RBC: 4.09 MIL/uL (ref 3.87–5.11)
RDW: 26 % — ABNORMAL HIGH (ref 11.5–15.5)
WBC: 7.6 10*3/uL (ref 4.0–10.5)
nRBC: 0 % (ref 0.0–0.2)

## 2021-06-20 MED ORDER — METOPROLOL SUCCINATE ER 25 MG PO TB24
12.5000 mg | ORAL_TABLET | Freq: Every day | ORAL | Status: DC
  Administered 2021-06-20 – 2021-06-22 (×3): 12.5 mg via ORAL
  Filled 2021-06-20 (×3): qty 1

## 2021-06-20 MED ORDER — SPIRONOLACTONE 12.5 MG HALF TABLET
12.5000 mg | ORAL_TABLET | Freq: Every day | ORAL | Status: DC
  Administered 2021-06-20 – 2021-06-22 (×3): 12.5 mg via ORAL
  Filled 2021-06-20 (×3): qty 1

## 2021-06-20 MED ORDER — WARFARIN SODIUM 5 MG PO TABS
10.0000 mg | ORAL_TABLET | Freq: Once | ORAL | Status: AC
Start: 2021-06-20 — End: 2021-06-20
  Administered 2021-06-20: 10 mg via ORAL
  Filled 2021-06-20: qty 2

## 2021-06-20 MED FILL — Lidocaine HCl Local Preservative Free (PF) Inj 1%: INTRAMUSCULAR | Qty: 30 | Status: AC

## 2021-06-20 NOTE — Progress Notes (Signed)
Ambulatory sats dipped to 88% after 360 ft, immediately came back to 100% after a brief pause In hallway. Pt asymptomatic. Remained in 90s during walk until the end.

## 2021-06-20 NOTE — Progress Notes (Signed)
Coronary PROGRESS NOTE    [name] [name]  IRS:[id number] DOB: [birth year] DOA: 06/12/2021 PCP: Carrolyn Meiers, MD   Brief Narrative: 46 year old with past medical history significant for hypertension, history of DVT/PE on lifelong anticoagulation, pulmonary hypertension, OSA on CPAP, history of anemia due to menorrhagia Khalia myeloma..  Patient had a recurrent DVT January 2023, evaluated by vascular and underwent thrombectomy.  Recent admission 5/16 until 5/18 due to community-acquired pneumonia and acute on chronic diastolic heart failure exacerbation.  Echo 05/1618 23 ejection fraction 45 to 50% global hypokinesis.  She was compliant with diuretic.  She presented with worsening shortness of breath, admitted for recurrent heart failure exacerbation.  Patient was intubated  in the ED at [facility] and transferred to [facility] for further care.  Patient was able to be extubated 06/14/2021 and oxygen requirement weaned down to room air. Cardiology consulted underwent heart cath 5/31, which showed 20 to 30% proximal circumflex, coronary otherwise normal.  Elevated end-diastolic pressure of 31 consistent with acute on chronic combined systolic and diastolic heart failure.  Ejection fraction 45 to 50%.  Pulmonary hypertension 30 mmHg.    Assessment & Plan:   Principal Problem:   Acute on chronic diastolic CHF (congestive heart failure) (HCC) Active Problems:   Chronic pain syndrome   Aspiration pneumonia (HCC)   OSA on CPAP   Tobacco use disorder   Severe Iron deficiency anemia   Morbid obesity ([place])   History of pulmonary embolism   Acute respiratory failure with hypoxia (HCC)   Acute on chronic systolic congestive heart failure (HCC)   Pulmonary edema   Dysphagia   1-Acute on Chronic Combined Systolic and Diastolic Heart Failure: -Recent echo show ejection fraction mildly down to 45 to 50% by echo. -Right and left heart cath 5/30: Which showed nonobstructive disease and mild  pulmonary hypertension. -Currently off of oxygen -Check oxygen on ambulation -Continue with IV Lasix. -Cardiology  is adjusting medications for heart failure, start spironolactone and beta-blockers today.  2-Acute Hypoxic Respiratory Failure: In the setting of pneumonia and heart failure exacerbation. Intubated on admission at [facility] ED. Extubated 5/25. Treated with diuresis.  Check oxygen on ambulation.   3-Hospital-acquired pneumonia: Completed IV antibiotics.   4-Pulmonary HTN;  S/P Right heart cath: Mild Pulmonary HTN.   5-HTN; On IV lasix.  Started on metoprolol.   History of anemia secondary to leiomyoma/menorrhagia: Monitor hemoglobin. Down to 8.   History of PE/DVT Continue with Lovenox/coumadin.   Nutrition Problem: Swallowing difficulty Etiology: dysphagia    Signs/Symptoms: other (comment) (need for dysphagia 3, nectar-thick liquids diet)    Interventions: Hormel Shake  Estimated body mass index is 39.26 kg/m as calculated from the following:   Height as of this encounter: '5\' 10"'$  (1.778 m).   Weight as of this encounter: 124.1 kg.   DVT prophylaxis: Lovenox/Coumadin Code Status: Full code Family Communication: Care discussed with patient Disposition Plan:  Status is: Inpatient Remains inpatient appropriate because: management of HF    Consultants:  Cardiology   Procedures:  [place] cath 5/30.  Antimicrobials:    Subjective: She is breathing better.   Objective: Vitals:   06/19/21 1453 06/19/21 2000 06/20/21 1156 06/20/21 1350  BP: (!) 108/55 98/60 116/63 105/64  Pulse: 82 80 79 76  Resp: 18   18  Temp: 98.5 F (36.9 C) 97.6 F (36.4 C)  98 F (36.7 C)  TempSrc: Oral Oral  Oral  SpO2: 100% 98%  100%  Weight:  Height:        Intake/Output Summary (Last 24 hours) at 06/20/2021 1429 Last data filed at 06/20/2021 0900 Gross per 24 hour  Intake 468.31 ml  Output --  Net 468.31 ml   Filed Weights   06/16/21 0500  06/17/21 0447 06/19/21 0500  Weight: 122.5 kg 123.5 kg 124.1 kg    Examination:  General exam: Appears calm and comfortable  Respiratory system: Clear to auscultation. Respiratory effort normal. Cardiovascular system: S1 & S2 heard, RRR.  Gastrointestinal system: Abdomen is nondistended, soft and nontender. No organomegaly or masses felt. Normal bowel sounds heard. Central nervous system: Alert and oriented. No focal neurological deficits. Extremities: Symmetric 5 x 5 power.    Data Reviewed: I have personally reviewed following labs and imaging studies  CBC: Recent Labs  Lab 06/16/21 0531 06/17/21 0300 06/18/21 0442 06/19/21 0520 06/19/21 1049 06/19/21 1052 06/19/21 1057 06/20/21 0540  WBC 9.9 8.3 7.5 7.2  --   --   --  7.6  HGB 9.3* 8.2* 8.2* 8.3* 10.5*  10.5* 10.2* 10.5* 8.1*  HCT 33.7* 30.6* 31.8* 29.3* 31.0*  31.0* 30.0* 31.0* 29.3*  MCV 72.0* 72.3* 76.3* 71.1*  --   --   --  71.6*  PLT 383 348 323 331  --   --   --  643   Basic Metabolic Panel: Recent Labs  Lab 06/13/21 1701 06/14/21 0302 06/14/21 1722 06/14/21 2227 06/15/21 1019 06/19/21 0520 06/19/21 1049 06/19/21 1052 06/19/21 1057  NA 140 141 140 142 142 138 139  139 138 134*  K 4.5 3.8 4.0 3.8 3.7 3.1* 3.5  3.5 3.6 3.4*  CL 105 104 104 103 103 100  --   --   --   CO2 '29 30 28 '$ 32 30 30  --   --   --   GLUCOSE 83 79 100* 100* 109* 90  --   --   --   BUN 32* 31* 27* 25* 24* 16  --   --   --   CREATININE 0.95 0.86 0.80 0.83 0.67 0.68  --   --   --   CALCIUM 8.9 8.6* 8.9 9.2 9.5 8.9  --   --   --   MG 2.5* 2.3  --   --   --  1.9  --   --   --   PHOS 4.4 4.8*  --   --   --   --   --   --   --    GFR: Estimated Creatinine Clearance: 128.5 mL/min (by C-G formula based on SCr of 0.68 mg/dL). Liver Function Tests: Recent Labs  Lab 06/14/21 2227  AST 26  ALT 23  ALKPHOS 43  BILITOT 0.7  PROT 7.7  ALBUMIN 3.1*   No results for input(s): LIPASE, AMYLASE in the last 168 hours. No results for  input(s): AMMONIA in the last 168 hours. Coagulation Profile: Recent Labs  Lab 06/16/21 0531 06/17/21 0300 06/18/21 0442 06/19/21 0520 06/20/21 0540  INR 1.3* 1.2 1.2 1.1 1.1   Cardiac Enzymes: No results for input(s): CKTOTAL, CKMB, CKMBINDEX, TROPONINI in the last 168 hours. BNP (last 3 results) No results for input(s): PROBNP in the last 8760 hours. HbA1C: No results for input(s): HGBA1C in the last 72 hours. CBG: Recent Labs  Lab 06/13/21 2008 06/13/21 2338 06/14/21 0344 06/14/21 0813 06/14/21 1152  GLUCAP 84 109* 99 92 133*   Lipid Profile: No results for input(s): CHOL, HDL, LDLCALC, TRIG, CHOLHDL, LDLDIRECT in  the last 72 hours. Thyroid Function Tests: No results for input(s): TSH, T4TOTAL, FREET4, T3FREE, THYROIDAB in the last 72 hours. Anemia Panel: No results for input(s): VITAMINB12, FOLATE, FERRITIN, TIBC, IRON, RETICCTPCT in the last 72 hours. Sepsis Labs: No results for input(s): PROCALCITON, LATICACIDVEN in the last 168 hours.  Recent Results (from the past 240 hour(s))  MRSA Next Gen by PCR, Nasal     Status: None   Collection Time: 06/12/21  1:11 PM   Specimen: Nasal Mucosa; Nasal Swab  Result Value Ref Range Status   MRSA by PCR Next Gen NOT DETECTED NOT DETECTED Final    Comment: (NOTE) The GeneXpert MRSA Assay (FDA approved for NASAL specimens only), is one component of a comprehensive MRSA colonization surveillance program. It is not intended to diagnose MRSA infection nor to guide or monitor treatment for MRSA infections. Test performance is not FDA approved in patients less than 40 years old. Performed at [facility], [place] [address] [place], [place] [address]   Respiratory (~20 pathogens) panel by PCR     Status: None   Collection Time: 06/12/21  2:37 PM   Specimen: Nasopharyngeal Swab; Respiratory  Result Value Ref Range Status   Adenovirus NOT DETECTED NOT DETECTED Final   Coronavirus 229E NOT DETECTED NOT DETECTED  Final    Comment: (NOTE) The Coronavirus on the Respiratory Panel, DOES NOT test for the novel  Coronavirus (2019 nCoV)    Coronavirus HKU1 NOT DETECTED NOT DETECTED Final   Coronavirus NL63 NOT DETECTED NOT DETECTED Final   Coronavirus OC43 NOT DETECTED NOT DETECTED Final   Metapneumovirus NOT DETECTED NOT DETECTED Final   Rhinovirus / Enterovirus NOT DETECTED NOT DETECTED Final   Influenza A NOT DETECTED NOT DETECTED Final   Influenza B NOT DETECTED NOT DETECTED Final   Parainfluenza Virus 1 NOT DETECTED NOT DETECTED Final   Parainfluenza Virus 2 NOT DETECTED NOT DETECTED Final   Parainfluenza Virus 3 NOT DETECTED NOT DETECTED Final   Parainfluenza Virus 4 NOT DETECTED NOT DETECTED Final   Respiratory Syncytial Virus NOT DETECTED NOT DETECTED Final   Bordetella pertussis NOT DETECTED NOT DETECTED Final   Bordetella Parapertussis NOT DETECTED NOT DETECTED Final   Chlamydophila pneumoniae NOT DETECTED NOT DETECTED Final   Mycoplasma pneumoniae NOT DETECTED NOT DETECTED Final    Comment: Performed at [facility], [place] [address], [place], [place] [address]  Culture, Respiratory w Gram Stain     Status: None   Collection Time: 06/12/21  3:23 PM   Specimen: Tracheal Aspirate; Respiratory  Result Value Ref Range Status   Specimen Description   Final    TRACHEAL ASPIRATE Performed at [place] [address], [place], [place] [address]    Special Requests   Final    NONE Performed at [facility], [place] [address], [place], [place] [address]    Gram Stain   Final    FEW WBC PRESENT, PREDOMINANTLY PMN NO ORGANISMS SEEN    Culture   Final    RARE Normal respiratory flora-no Staph aureus or Pseudomonas seen Performed at [place] [address], [place], [place] [address]    Report Status 06/17/2021 FINAL  Final         Radiology Studies: CARDIAC CATHETERIZATION  Result Date: 06/19/2021 CONCLUSIONS: 40 to 50% proximal  right coronary narrowing. Right dominant anatomy Coronary arteries otherwise normal Significant elevation LVEDP 30 mmHg consistent with acute on chronic combined systolic and  diastolic heart failure Mild pulmonary hypertension, mean pressure 30 mmHg.  WHO group II etiology based on hemodysnamics.. Capillary wedge mean 24 mmHg.  V wave to 40 mmHg suggesting some degree of mitral regurgitation. Cardiac output 8.5 L/min with index 3.57 L/min/m. Pulmonary resistance 0.7 Wood units. RECOMMENDATIONS: Care with IV fluid administration. Needs diuresis. Start heart failure therapy including ARNI and SGLT2 as tolerated.       Scheduled Meds:  Chlorhexidine Gluconate Cloth  6 each Topical Q0600   enoxaparin (LOVENOX) injection  120 mg Subcutaneous Q12H   furosemide  80 mg Intravenous BID   metoprolol succinate  12.5 mg Oral Daily   sodium chloride flush  3 mL Intravenous Q12H   spironolactone  12.5 mg Oral Daily   warfarin  10 mg Oral ONCE-1600   Warfarin - Pharmacist Dosing Inpatient   Does not apply q1600   Continuous Infusions:  sodium chloride       LOS: 8 days    Time spent: 35 minutes.     Elmarie Shiley, MD [facility]   If 7PM-7AM, please contact night-coverage [email or web address]  06/20/2021, 2:29 PM

## 2021-06-20 NOTE — Plan of Care (Signed)

## 2021-06-20 NOTE — Progress Notes (Signed)
Nutrition Follow-up  DOCUMENTATION CODES:   Obesity unspecified  INTERVENTION:   -D/c  Hormel Shake once/day  -Encourage PO intakes  NUTRITION DIAGNOSIS:   Swallowing difficulty related to dysphagia as evidenced by other (comment) (need for dysphagia 3, nectar-thick liquids diet).  Improved. Now on regular consistency diet.  GOAL:   Patient will meet greater than or equal to 90% of their needs  Progressing.  MONITOR:   PO intake, Supplement acceptance, Diet advancement, Labs, Weight trends  ASSESSMENT:   46 y.o. female with medical history of CHF, OSA on CPAP, history of DVT/PE on coumadin, HTN, and iron deficiency anemia. She presented to [facility] ED due to worsening shortness of breath. CXR showed pulmonary edema vs PNA and she ultimately required intubation. She was then transferred to [facility] ICU.  5/23: admitted, intubated 5/25: extubated 5/26: diet advanced to Dysphagia 3, nectar thick liquids 5/27: Regular 5/30: Heart healthy, s/p cardiac cath  Patient currently consuming 85-100% of meals. Will discontinue Hormel shake.  Admission weight: 291 lbs Current weight: 273 lbs  Medications: Lasix  Labs reviewed: Low Na, K  Diet Order:   Diet Order             Diet Heart Room service appropriate? Yes; Fluid consistency: Thin  Diet effective now                   EDUCATION NEEDS:   No education needs have been identified at this time  Skin:  Skin Assessment: Reviewed RN Assessment  Last BM:  5/29  Height:   Ht Readings from Last 1 Encounters:  06/13/21 '5\' 10"'$  (1.778 m)    Weight:   Wt Readings from Last 1 Encounters:  06/19/21 124.1 kg    BMI:  Body mass index is 39.26 kg/m.  Estimated Nutritional Needs:   Kcal:  2000-2200 kcal  Protein:  100-115 grams  Fluid:  >/= 2 L/day   Clayton Bibles, MS, RD, LDN Inpatient Clinical Dietitian Contact information available via Amion

## 2021-06-20 NOTE — Care Management Important Message (Signed)
Important Message  Patient Details IM Letter given to the Patient. Name: [name] [name] MRN: [id number] Date of Birth: 1975-07-31   Medicare Important Message Given:  Yes     Kerin Salen 06/20/2021, 10:37 AM

## 2021-06-20 NOTE — Progress Notes (Signed)
Progress Note  Patient Name: [name] [name] Date of Encounter: 06/20/2021  [facility] Cardiologist: Carlyle Dolly, MD   Subjective   No complaints. Cath yesterday showed 50% proximal LCX stenosis. Not likely the culprit for her heart failure. Still not clinically compensated by cath - PCWP 24 mmHg.   Inpatient Medications    Scheduled Meds:  Chlorhexidine Gluconate Cloth  6 each Topical Q0600   enoxaparin (LOVENOX) injection  120 mg Subcutaneous Q12H   furosemide  80 mg Intravenous BID   sodium chloride flush  3 mL Intravenous Q12H   warfarin  10 mg Oral ONCE-1600   Warfarin - Pharmacist Dosing Inpatient   Does not apply q1600   Continuous Infusions:  sodium chloride     PRN Meds: sodium chloride, acetaminophen, ondansetron (ZOFRAN) IV, ondansetron (ZOFRAN) IV, sodium chloride flush   Vital Signs    Vitals:   06/19/21 1331 06/19/21 1400 06/19/21 1453 06/19/21 2000  BP:  (!) 96/49 (!) 108/55 98/60  Pulse:   82 80  Resp:  18 18   Temp:   98.5 F (36.9 C) 97.6 F (36.4 C)  TempSrc:   Oral Oral  SpO2: 100%  100% 98%  Weight:      Height:        Intake/Output Summary (Last 24 hours) at 06/20/2021 1052 Last data filed at 06/20/2021 0900 Gross per 24 hour  Intake 468.31 ml  Output --  Net 468.31 ml      06/19/2021    5:00 AM 06/17/2021    4:47 AM 06/16/2021    5:00 AM  Last 3 Weights  Weight (lbs) 273 lb 9.5 oz 272 lb 4.3 oz 270 lb 1 oz  Weight (kg) 124.1 kg 123.5 kg 122.5 kg      Telemetry    Sinus rhythm - Personally Reviewed  ECG    No new tracings - Personally Reviewed  Physical Exam   GEN: No acute distress.   Neck: 3 cmH20 JVD Cardiac: RRR, no murmurs, rubs, or gallops.  Respiratory: Clear to auscultation bilaterally. GI: Soft, nontender, non-distended  MS: No edema; No deformity. Neuro:  Nonfocal  Psych: Normal affect   Labs    High Sensitivity Troponin:   Recent Labs  Lab 06/04/21 2321 06/12/21 1409  TROPONINIHS 13 8      Chemistry Recent Labs  Lab 06/13/21 1701 06/14/21 0302 06/14/21 1722 06/14/21 2227 06/15/21 1019 06/19/21 0520 06/19/21 1049 06/19/21 1052 06/19/21 1057  NA 140 141   < > 142 142 138 139  139 138 134*  K 4.5 3.8   < > 3.8 3.7 3.1* 3.5  3.5 3.6 3.4*  CL 105 104   < > 103 103 100  --   --   --   CO2 29 30   < > 32 30 30  --   --   --   GLUCOSE 83 79   < > 100* 109* 90  --   --   --   BUN 32* 31*   < > 25* 24* 16  --   --   --   CREATININE 0.95 0.86   < > 0.83 0.67 0.68  --   --   --   CALCIUM 8.9 8.6*   < > 9.2 9.5 8.9  --   --   --   MG 2.5* 2.3  --   --   --  1.9  --   --   --   PROT  --   --   --  7.7  --   --   --   --   --   ALBUMIN  --   --   --  3.1*  --   --   --   --   --   AST  --   --   --  26  --   --   --   --   --   ALT  --   --   --  23  --   --   --   --   --   ALKPHOS  --   --   --  43  --   --   --   --   --   BILITOT  --   --   --  0.7  --   --   --   --   --   GFRNONAA >60 >60   < > >60 >60 >60  --   --   --   ANIONGAP 6 7   < > '7 9 8  '$ --   --   --    < > = values in this interval not displayed.    Lipids  Recent Labs  Lab 06/16/21 0531  TRIG 52    Hematology Recent Labs  Lab 06/18/21 0442 06/19/21 0520 06/19/21 1049 06/19/21 1052 06/19/21 1057 06/20/21 0540  WBC 7.5 7.2  --   --   --  7.6  RBC 4.17 4.12  --   --   --  4.09  HGB 8.2* 8.3*   < > 10.2* 10.5* 8.1*  HCT 31.8* 29.3*   < > 30.0* 31.0* 29.3*  MCV 76.3* 71.1*  --   --   --  71.6*  MCH 19.7* 20.1*  --   --   --  19.8*  MCHC 25.8* 28.3*  --   --   --  27.6*  RDW 26.3* 25.9*  --   --   --  26.0*  PLT 323 331  --   --   --  305   < > = values in this interval not displayed.   Thyroid No results for input(s): TSH, FREET4 in the last 168 hours.  BNPNo results for input(s): BNP, PROBNP in the last 168 hours.  DDimer No results for input(s): DDIMER in the last 168 hours.   Radiology    CARDIAC CATHETERIZATION  Result Date: 06/19/2021 CONCLUSIONS: 40 to 50% proximal right coronary  narrowing. Right dominant anatomy Coronary arteries otherwise normal Significant elevation LVEDP 30 mmHg consistent with acute on chronic combined systolic and diastolic heart failure Mild pulmonary hypertension, mean pressure 30 mmHg.  WHO group II etiology based on hemodysnamics.. Capillary wedge mean 24 mmHg.  V wave to 40 mmHg suggesting some degree of mitral regurgitation. Cardiac output 8.5 L/min with index 3.57 L/min/m. Pulmonary resistance 0.7 Wood units. RECOMMENDATIONS: Care with IV fluid administration. Needs diuresis. Start heart failure therapy including ARNI and SGLT2 as tolerated.   Cardiac Studies   Echocardiogram 06/05/21  1. Left ventricular ejection fraction, by estimation, is 45 to 50%. The  left ventricle has mildly decreased function. The left ventricle  demonstrates global hypokinesis. The left ventricular internal cavity size  was moderately dilated. There is mild  left ventricular hypertrophy. Left ventricular diastolic parameters are  indeterminate.   2. Right ventricular systolic function is normal. The right ventricular  size is normal. There is severely elevated pulmonary artery systolic  pressure.   3. Left  atrial size was severely dilated.   4. Right atrial size was mildly dilated.   5. The mitral valve is abnormal. Mild mitral valve regurgitation. No  evidence of mitral stenosis.   6. The tricuspid valve is abnormal.   7. The aortic valve is tricuspid. Aortic valve regurgitation is not  visualized. No aortic stenosis is present.   8. The inferior vena cava is normal in size with greater than 50%  respiratory variability, suggesting right atrial pressure of 3 mmHg.   Patient Profile     46 y.o. 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  Erin Fulling.  Assessment & Plan    Acute on chronic combined systolic and diastolic heart failure  Pulmonary HTN (mild on R/L heart cath 5/30)  - Echocardiogram 06/05/21 showed EF mildly down to 45-50%, mild LVH (full report above)  - Underwent R/L heart cath on 06/19/21 that showed 40-50% proximal right coronary narrowing, otherwise normal coronary arteries. Also showed significant elevation in LVEDP, consistent with acute on chronic combince systolic and diastolic heart failure. 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 EF 45-50%  - Needs diuresis, currently on IV lasix 80 mg BID. Currently net -7.4 L since admission. Renal function stable  - BP soft, will limit GDMT -- add/titrate as able  - Start spironolactone 12.5 mg daily  - Start metoprolol 12.5 mg daily  - Consider adding Entresto as BP allows  Acute Respiratory Failure  - Required intubation, extubated on 5/25 and doing well on room air  - Likely a combination of heart failure and PNA   Otherwise per primary  - Hospital-acquired PNA  - Recurrent DVT/PE-- on coumadin  - History of anemia secondary to leiomyoma/menorrhagia  - Bridging lovenox and warfarin.     For questions or updates, please contact [place] Please consult [email or web address] for contact info under   Pixie Casino, MD, FACC, [place] Director of the [facility] Diplomate of the American Board of Clinical Lipidology Attending Cardiologist  Direct Dial: [phone]  Fax: [phone]  Website:  [email or web address]

## 2021-06-20 NOTE — Progress Notes (Signed)
[place] for Warfarin Indication: Hx of PE/DVT  Allergies  Allergen Reactions   Sulfa Antibiotics Itching, Swelling and Rash    Lips swelling    Patient Measurements: Height: '5\' 10"'$  (177.8 cm) (per ECHO) Weight: 124.1 kg (273 lb 9.5 oz) IBW/kg (Calculated) : 68.5  Vital Signs: Temp: 97.6 F (36.4 C) (05/30 2000) Temp Source: Oral (05/30 2000) BP: 98/60 (05/30 2000) Pulse Rate: 80 (05/30 2000)  Labs: Recent Labs    06/17/21 1717 06/18/21 0442 06/18/21 0442 06/19/21 0520 06/19/21 1049 06/19/21 1052 06/19/21 1057 06/20/21 0540  HGB  --  8.2*   < > 8.3*   < > 10.2* 10.5* 8.1*  HCT  --  31.8*   < > 29.3*   < > 30.0* 31.0* 29.3*  PLT  --  323  --  331  --   --   --  305  LABPROT  --  14.9  --  14.1  --   --   --  14.4  INR  --  1.2  --  1.1  --   --   --  1.1  HEPARINUNFRC 0.55 0.43  --  0.47  --   --   --   --   CREATININE  --   --   --  0.68  --   --   --   --    < > = values in this interval not displayed.     Estimated Creatinine Clearance: 128.5 mL/min (by C-G formula based on SCr of 0.68 mg/dL).  Medications: Warfarin PTA Per Coumadin Clinic notes- she takes 10 mg PO daily except '15mg'$  on Tues and Sat  Assessment: 46 yo F on warfarin PTA for hx DVT/PE. Warfarin was held from 5/26 - 5/29 and patient was anticoagulated with heparin drip prior to cardiac cath. Pt underwent left and right heart cardiac cath on 5/30. Pharmacy was consulted to resume warfarin on 5/30. Bridge therapy transitioned from IV Heparin to LMWH post-cath.  INR was 3.3 on admission.   Today, 06/20/2021 INR = 1.1 remains subtherapeutic as expected after holding warfarin CBC: Hgb low but stable; Plt WNL & stable Noted that patient has chronic anemia Diet: Heart Healthy, meal intake not charted Drug interactions: None significant  Goal of Therapy:  INR 2-3   Plan:  Warfarin 10 mg PO once this evening Continue enoxaparin bridge of 120 mg (1 mg/kg)  subQ q12h while INR subtherapeutic Recheck CBC with AM labs tomorrow. Monitor renal function INR daily while inpatient  If patient were to discharge today, would recommend discharging on home dose of warfarin + enoxaparin bridge with anticoagulation clinic follow up for INR check in 48 hours on Friday June 2nd.   Lenis Noon, PharmD 06/20/21 7:23 AM

## 2021-06-21 DIAGNOSIS — Z9989 Dependence on other enabling machines and devices: Secondary | ICD-10-CM | POA: Diagnosis not present

## 2021-06-21 DIAGNOSIS — Z86711 Personal history of pulmonary embolism: Secondary | ICD-10-CM | POA: Diagnosis not present

## 2021-06-21 DIAGNOSIS — I5033 Acute on chronic diastolic (congestive) heart failure: Secondary | ICD-10-CM | POA: Diagnosis not present

## 2021-06-21 DIAGNOSIS — G4733 Obstructive sleep apnea (adult) (pediatric): Secondary | ICD-10-CM | POA: Diagnosis not present

## 2021-06-21 DIAGNOSIS — I5023 Acute on chronic systolic (congestive) heart failure: Secondary | ICD-10-CM | POA: Diagnosis not present

## 2021-06-21 LAB — BASIC METABOLIC PANEL
Anion gap: 5 (ref 5–15)
BUN: 13 mg/dL (ref 6–20)
CO2: 29 mmol/L (ref 22–32)
Calcium: 9.2 mg/dL (ref 8.9–10.3)
Chloride: 104 mmol/L (ref 98–111)
Creatinine, Ser: 0.68 mg/dL (ref 0.44–1.00)
GFR, Estimated: >60 mL/min (ref >60)
Glucose, Bld: 86 mg/dL (ref 70–99)
Potassium: 3.8 mmol/L (ref 3.5–5.1)
Sodium: 138 mmol/L (ref 135–145)

## 2021-06-21 LAB — CBC
HCT: 30.4 % — ABNORMAL LOW (ref 36.0–46.0)
Hemoglobin: 8.2 g/dL — ABNORMAL LOW (ref 12.0–15.0)
MCH: 19.9 pg — ABNORMAL LOW (ref 26.0–34.0)
MCHC: 27 g/dL — ABNORMAL LOW (ref 30.0–36.0)
MCV: 73.6 fL — ABNORMAL LOW (ref 80.0–100.0)
Platelets: 298 10*3/uL (ref 150–400)
RBC: 4.13 MIL/uL (ref 3.87–5.11)
RDW: 26.3 % — ABNORMAL HIGH (ref 11.5–15.5)
WBC: 6.2 10*3/uL (ref 4.0–10.5)
nRBC: 0 % (ref 0.0–0.2)

## 2021-06-21 LAB — PROTIME-INR
INR: 1.2 (ref 0.8–1.2)
Prothrombin Time: 15.1 seconds (ref 11.4–15.2)

## 2021-06-21 LAB — LIPOPROTEIN A (LPA): Lipoprotein (a): 50.7 nmol/L — ABNORMAL HIGH (ref <75.0)

## 2021-06-21 MED ORDER — WARFARIN SODIUM 5 MG PO TABS
10.0000 mg | ORAL_TABLET | Freq: Once | ORAL | Status: AC
Start: 2021-06-21 — End: 2021-06-21
  Administered 2021-06-21: 10 mg via ORAL
  Filled 2021-06-21: qty 2

## 2021-06-21 NOTE — Progress Notes (Signed)
Coronary PROGRESS NOTE    [name] [name]  MBT:[id number] DOB: 10-Feb-1975 DOA: 06/12/2021 PCP: Carrolyn Meiers, MD   Brief Narrative: 46 year old with past medical history significant for hypertension, history of DVT/PE on lifelong anticoagulation, pulmonary hypertension, OSA on CPAP, history of anemia due to menorrhagia [place] myeloma..  Patient had a recurrent DVT January 2023, evaluated by vascular and underwent thrombectomy.  Recent admission 5/16 until 5/18 due to community-acquired pneumonia and acute on chronic diastolic heart failure exacerbation.  Echo 05/1618 23 ejection fraction 45 to 50% global hypokinesis.  She was compliant with diuretic.  She presented with worsening shortness of breath, admitted for recurrent heart failure exacerbation.  Patient was intubated  in the ED at [facility] and transferred to [facility] for further care.  Patient was able to be extubated 06/14/2021 and oxygen requirement weaned down to room air. Cardiology consulted underwent heart cath 5/31, which showed 20 to 30% proximal circumflex, coronary otherwise normal.  Elevated end-diastolic pressure of 31 consistent with acute on chronic combined systolic and diastolic heart failure.  Ejection fraction 45 to 50%.  Pulmonary hypertension 30 mmHg.    Assessment & Plan:   Principal Problem:   Acute on chronic diastolic CHF (congestive heart failure) (HCC) Active Problems:   Chronic pain syndrome   Aspiration pneumonia (HCC)   OSA on CPAP   Tobacco use disorder   Severe Iron deficiency anemia   Morbid obesity ([place])   History of pulmonary embolism   Acute respiratory failure with hypoxia (HCC)   Acute on chronic systolic congestive heart failure (HCC)   Pulmonary edema   Dysphagia   1-Acute on Chronic Combined Systolic and Diastolic Heart Failure: -Recent echo show ejection fraction mildly down to 45 to 50% by echo. -Right and left heart cath 5/30: Which showed nonobstructive disease and mild  pulmonary hypertension. -Currently off of oxygen -Continue with IV Lasix. -Cardiology  is adjusting medications for heart failure, started  spironolactone and beta-blockers 5/31. -Negative 7 L.   2-Acute Hypoxic Respiratory Failure: In the setting of pneumonia and heart failure exacerbation. Intubated on admission at [facility] ED. Extubated 5/25. Treated with diuresis.  Check oxygen on ambulation.   3-Hospital-acquired pneumonia: Completed IV antibiotics.   4-Pulmonary HTN;  S/P Right heart cath: Mild Pulmonary HTN.   5-HTN; On IV lasix.  Started on metoprolol.   History of anemia secondary to leiomyoma/menorrhagia: Monitor hemoglobin. Down to 8.  Hb stable.   History of PE/DVT Continue with Lovenox/coumadin.  She will need lovenox at discharge.  INR 1.2   Nutrition Problem: Swallowing difficulty Etiology: dysphagia    Signs/Symptoms: other (comment) (need for dysphagia 3, nectar-thick liquids diet)    Interventions: Hormel Shake  Estimated body mass index is 38.63 kg/m as calculated from the following:   Height as of this encounter: '5\' 10"'$  (1.778 m).   Weight as of this encounter: 122.1 kg.   DVT prophylaxis: Lovenox/Coumadin Code Status: Full code Family Communication: Care discussed with patient Disposition Plan:  Status is: Inpatient Remains inpatient appropriate because: management of HF    Consultants:  Cardiology   Procedures:  [place] cath 5/30.  Antimicrobials:    Subjective: She is breathing better, denies chest pain. We discussed fluid restriction and low salt diet.   Objective: Vitals:   06/20/21 1350 06/20/21 2013 06/21/21 0430 06/21/21 1317  BP: 105/64 (!) 100/56 (!) 105/54   Pulse: 76 79 79   Resp: '18 18 16   '$ Temp: 98 F (36.7 C) 98.6  F (37 C) 98.5 F (36.9 C)   TempSrc: Oral     SpO2: 100% 100% 100%   Weight:    122.1 kg  Height:       No intake or output data in the 24 hours ending 06/21/21 1341  Filed Weights    06/17/21 0447 06/19/21 0500 06/21/21 1317  Weight: 123.5 kg 124.1 kg 122.1 kg    Examination:  General exam: NAD Respiratory system: CTA Cardiovascular system:  S1, S 2 RRR Gastrointestinal system: BS present, soft nt Central nervous system: Non focal.  Extremities: trace edema    Data Reviewed: I have personally reviewed following labs and imaging studies  CBC: Recent Labs  Lab 06/17/21 0300 06/18/21 0442 06/19/21 0520 06/19/21 1049 06/19/21 1052 06/19/21 1057 06/20/21 0540 06/21/21 0539  WBC 8.3 7.5 7.2  --   --   --  7.6 6.2  HGB 8.2* 8.2* 8.3* 10.5*  10.5* 10.2* 10.5* 8.1* 8.2*  HCT 30.6* 31.8* 29.3* 31.0*  31.0* 30.0* 31.0* 29.3* 30.4*  MCV 72.3* 76.3* 71.1*  --   --   --  71.6* 73.6*  PLT 348 323 331  --   --   --  305 161    Basic Metabolic Panel: Recent Labs  Lab 06/14/21 1722 06/14/21 2227 06/15/21 1019 06/19/21 0520 06/19/21 1049 06/19/21 1052 06/19/21 1057 06/21/21 0539  NA 140 142 142 138 139  139 138 134* 138  K 4.0 3.8 3.7 3.1* 3.5  3.5 3.6 3.4* 3.8  CL 104 103 103 100  --   --   --  104  CO2 28 32 30 30  --   --   --  29  GLUCOSE 100* 100* 109* 90  --   --   --  86  BUN 27* 25* 24* 16  --   --   --  13  CREATININE 0.80 0.83 0.67 0.68  --   --   --  0.68  CALCIUM 8.9 9.2 9.5 8.9  --   --   --  9.2  MG  --   --   --  1.9  --   --   --   --     GFR: Estimated Creatinine Clearance: 127.4 mL/min (by C-G formula based on SCr of 0.68 mg/dL). Liver Function Tests: Recent Labs  Lab 06/14/21 2227  AST 26  ALT 23  ALKPHOS 43  BILITOT 0.7  PROT 7.7  ALBUMIN 3.1*    No results for input(s): LIPASE, AMYLASE in the last 168 hours. No results for input(s): AMMONIA in the last 168 hours. Coagulation Profile: Recent Labs  Lab 06/17/21 0300 06/18/21 0442 06/19/21 0520 06/20/21 0540 06/21/21 0539  INR 1.2 1.2 1.1 1.1 1.2    Cardiac Enzymes: No results for input(s): CKTOTAL, CKMB, CKMBINDEX, TROPONINI in the last 168 hours. BNP  (last 3 results) No results for input(s): PROBNP in the last 8760 hours. HbA1C: No results for input(s): HGBA1C in the last 72 hours. CBG: No results for input(s): GLUCAP in the last 168 hours.  Lipid Profile: No results for input(s): CHOL, HDL, LDLCALC, TRIG, CHOLHDL, LDLDIRECT in the last 72 hours. Thyroid Function Tests: No results for input(s): TSH, T4TOTAL, FREET4, T3FREE, THYROIDAB in the last 72 hours. Anemia Panel: No results for input(s): VITAMINB12, FOLATE, FERRITIN, TIBC, IRON, RETICCTPCT in the last 72 hours. Sepsis Labs: No results for input(s): PROCALCITON, LATICACIDVEN in the last 168 hours.  Recent Results (from the past 240 hour(s))  MRSA  Next Gen by PCR, Nasal     Status: None   Collection Time: 06/12/21  1:11 PM   Specimen: Nasal Mucosa; Nasal Swab  Result Value Ref Range Status   MRSA by PCR Next Gen NOT DETECTED NOT DETECTED Final    Comment: (NOTE) The GeneXpert MRSA Assay (FDA approved for NASAL specimens only), is one component of a comprehensive MRSA colonization surveillance program. It is not intended to diagnose MRSA infection nor to guide or monitor treatment for MRSA infections. Test performance is not FDA approved in patients less than 25 years old. Performed at [facility], [place] [address] [place], [place] [address]   Respiratory (~20 pathogens) panel by PCR     Status: None   Collection Time: 06/12/21  2:37 PM   Specimen: Nasopharyngeal Swab; Respiratory  Result Value Ref Range Status   Adenovirus NOT DETECTED NOT DETECTED Final   Coronavirus 229E NOT DETECTED NOT DETECTED Final    Comment: (NOTE) The Coronavirus on the Respiratory Panel, DOES NOT test for the novel  Coronavirus (2019 nCoV)    Coronavirus HKU1 NOT DETECTED NOT DETECTED Final   Coronavirus NL63 NOT DETECTED NOT DETECTED Final   Coronavirus OC43 NOT DETECTED NOT DETECTED Final   Metapneumovirus NOT DETECTED NOT DETECTED Final   Rhinovirus / Enterovirus  NOT DETECTED NOT DETECTED Final   Influenza A NOT DETECTED NOT DETECTED Final   Influenza B NOT DETECTED NOT DETECTED Final   Parainfluenza Virus 1 NOT DETECTED NOT DETECTED Final   Parainfluenza Virus 2 NOT DETECTED NOT DETECTED Final   Parainfluenza Virus 3 NOT DETECTED NOT DETECTED Final   Parainfluenza Virus 4 NOT DETECTED NOT DETECTED Final   Respiratory Syncytial Virus NOT DETECTED NOT DETECTED Final   Bordetella pertussis NOT DETECTED NOT DETECTED Final   Bordetella Parapertussis NOT DETECTED NOT DETECTED Final   Chlamydophila pneumoniae NOT DETECTED NOT DETECTED Final   Mycoplasma pneumoniae NOT DETECTED NOT DETECTED Final    Comment: Performed at [facility], [place]. [address], [place], [place] [address]  Culture, Respiratory w Gram Stain     Status: None   Collection Time: 06/12/21  3:23 PM   Specimen: Tracheal Aspirate; Respiratory  Result Value Ref Range Status   Specimen Description   Final    TRACHEAL ASPIRATE Performed at [facility] [address] [place], [place] [address]    Special Requests   Final    NONE Performed at [facility], [place] [address], [place], [place] [address]    Gram Stain   Final    FEW WBC PRESENT, PREDOMINANTLY PMN NO ORGANISMS SEEN    Culture   Final    RARE Normal respiratory flora-no Staph aureus or Pseudomonas seen Performed at [facility] [address], [place], [place] [address]    Report Status 06/17/2021 FINAL  Final          Radiology Studies: No results found.      Scheduled Meds:  Chlorhexidine Gluconate Cloth  6 each Topical Q0600   enoxaparin (LOVENOX) injection  120 mg Subcutaneous Q12H   furosemide  80 mg Intravenous BID   metoprolol succinate  12.5 mg Oral Daily   sodium chloride flush  3 mL Intravenous Q12H   spironolactone  12.5 mg Oral Daily   warfarin  10 mg Oral ONCE-1600   Warfarin - Pharmacist Dosing Inpatient   Does not apply q1600   Continuous  Infusions:  sodium chloride  LOS: 9 days    Time spent: 35 minutes.     Elmarie Shiley, MD [facility]   If 7PM-7AM, please contact night-coverage [email or web address]  06/21/2021, 1:41 PM

## 2021-06-21 NOTE — Progress Notes (Signed)
[place] for Warfarin Indication: Hx of PE/DVT  Allergies  Allergen Reactions   Sulfa Antibiotics Itching, Swelling and Rash    Lips swelling    Patient Measurements: Height: '5\' 10"'$  (177.8 cm) (per ECHO) Weight: 124.1 kg (273 lb 9.5 oz) IBW/kg (Calculated) : 68.5  Vital Signs: Temp: 98.5 F (36.9 C) (06/01 0430) BP: 105/54 (06/01 0430) Pulse Rate: 79 (06/01 0430)  Labs: Recent Labs    06/19/21 0520 06/19/21 1049 06/19/21 1057 06/20/21 0540 06/21/21 0539  HGB 8.3*   < > 10.5* 8.1* 8.2*  HCT 29.3*   < > 31.0* 29.3* 30.4*  PLT 331  --   --  305 298  LABPROT 14.1  --   --  14.4 15.1  INR 1.1  --   --  1.1 1.2  HEPARINUNFRC 0.47  --   --   --   --   CREATININE 0.68  --   --   --  0.68   < > = values in this interval not displayed.     Estimated Creatinine Clearance: 128.5 mL/min (by C-G formula based on SCr of 0.68 mg/dL).  Medications: Warfarin PTA Per Coumadin Clinic notes- she takes 10 mg PO daily except '15mg'$  on Tues and Sat  Assessment: 46 yo F on warfarin PTA for hx DVT/PE. Warfarin was held from 5/26 - 5/29 and patient was anticoagulated with heparin drip prior to cardiac cath. Pt underwent left and right heart cardiac cath on 5/30. Pharmacy was consulted to resume warfarin on 5/30. Bridge therapy transitioned from IV Heparin to LMWH post-cath.  INR was 3.3 on admission.   Today, 06/21/2021 INR = 1.2 remains subtherapeutic as expected after holding warfarin. Would anticipate 72 hrs of warfarin prior to seeing INR trending up. CBC: Hgb low but stable; Plt WNL & stable Noted that patient has chronic anemia Diet: Heart Healthy, 100% meal intake charted Drug interactions: None significant  Goal of Therapy:  INR 2-3   Plan:  Warfarin 10 mg PO once this evening. Continue home dose. Continue enoxaparin bridge of 120 mg (1 mg/kg) subQ q12h while INR subtherapeutic Monitor CBC, renal function INR daily while inpatient  If  patient were to discharge today, would recommend discharging on home dose of warfarin + enoxaparin bridge with anticoagulation clinic follow up for INR check within 72 hours of discharge.  Lenis Noon, PharmD 06/21/21 7:14 AM

## 2021-06-21 NOTE — Progress Notes (Addendum)
SATURATION QUALIFICATIONS: (This note is used to comply with regulatory documentation for home oxygen)  Patient Saturations on Room Air at Rest = 100%  Patient Saturations on Room Air while Ambulating = 82%  Patient Saturations on 3 Liters of oxygen while Ambulating = 96%  Please briefly explain why patient needs home oxygen: Desaturation while ambulating on room air.

## 2021-06-21 NOTE — Progress Notes (Signed)
Progress Note  Patient Name: [name] [name] Date of Encounter: 06/21/2021  [facility] Cardiologist: Carlyle Dolly, MD   Subjective   Recorded net positive overnight - no weights recorded, despite orders for daily weights and strict I's/O's. Patient says that she "urinated a lot" yesterday.  Inpatient Medications    Scheduled Meds:  Chlorhexidine Gluconate Cloth  6 each Topical Q0600   enoxaparin (LOVENOX) injection  120 mg Subcutaneous Q12H   furosemide  80 mg Intravenous BID   metoprolol succinate  12.5 mg Oral Daily   sodium chloride flush  3 mL Intravenous Q12H   spironolactone  12.5 mg Oral Daily   warfarin  10 mg Oral ONCE-1600   Warfarin - Pharmacist Dosing Inpatient   Does not apply q1600   Continuous Infusions:  sodium chloride     PRN Meds: sodium chloride, acetaminophen, ondansetron (ZOFRAN) IV, ondansetron (ZOFRAN) IV, sodium chloride flush   Vital Signs    Vitals:   06/20/21 1156 06/20/21 1350 06/20/21 2013 06/21/21 0430  BP: 116/63 105/64 (!) 100/56 (!) 105/54  Pulse: 79 76 79 79  Resp:  '18 18 16  '$ Temp:  98 F (36.7 C) 98.6 F (37 C) 98.5 F (36.9 C)  TempSrc:  Oral    SpO2:  100% 100% 100%  Weight:      Height:       No intake or output data in the 24 hours ending 06/21/21 1219     06/19/2021    5:00 AM 06/17/2021    4:47 AM 06/16/2021    5:00 AM  Last 3 Weights  Weight (lbs) 273 lb 9.5 oz 272 lb 4.3 oz 270 lb 1 oz  Weight (kg) 124.1 kg 123.5 kg 122.5 kg      Telemetry    Sinus rhythm - Personally Reviewed  ECG    No new tracings - Personally Reviewed  Physical Exam   GEN: No acute distress.   Neck: 1 cmH20 JVD Cardiac: RRR, no murmurs, rubs, or gallops.  Respiratory: Clear to auscultation bilaterally. GI: Soft, nontender, non-distended  MS: No edema; No deformity. Neuro:  Nonfocal  Psych: Normal affect   Labs    High Sensitivity Troponin:   Recent Labs  Lab 06/04/21 2321 06/12/21 1409  TROPONINIHS 13 8      Chemistry Recent Labs  Lab 06/14/21 2227 06/15/21 1019 06/19/21 0520 06/19/21 1049 06/19/21 1052 06/19/21 1057 06/21/21 0539  NA 142 142 138   < > 138 134* 138  K 3.8 3.7 3.1*   < > 3.6 3.4* 3.8  CL 103 103 100  --   --   --  104  CO2 32 30 30  --   --   --  29  GLUCOSE 100* 109* 90  --   --   --  86  BUN 25* 24* 16  --   --   --  13  CREATININE 0.83 0.67 0.68  --   --   --  0.68  CALCIUM 9.2 9.5 8.9  --   --   --  9.2  MG  --   --  1.9  --   --   --   --   PROT 7.7  --   --   --   --   --   --   ALBUMIN 3.1*  --   --   --   --   --   --   AST 26  --   --   --   --   --   --  ALT 23  --   --   --   --   --   --   ALKPHOS 43  --   --   --   --   --   --   BILITOT 0.7  --   --   --   --   --   --   GFRNONAA >60 >60 >60  --   --   --  >60  ANIONGAP '7 9 8  '$ --   --   --  5   < > = values in this interval not displayed.    Lipids  Recent Labs  Lab 06/16/21 0531  TRIG 52    Hematology Recent Labs  Lab 06/19/21 0520 06/19/21 1049 06/19/21 1057 06/20/21 0540 06/21/21 0539  WBC 7.2  --   --  7.6 6.2  RBC 4.12  --   --  4.09 4.13  HGB 8.3*   < > 10.5* 8.1* 8.2*  HCT 29.3*   < > 31.0* 29.3* 30.4*  MCV 71.1*  --   --  71.6* 73.6*  MCH 20.1*  --   --  19.8* 19.9*  MCHC 28.3*  --   --  27.6* 27.0*  RDW 25.9*  --   --  26.0* 26.3*  PLT 331  --   --  305 298   < > = values in this interval not displayed.   Thyroid No results for input(s): TSH, FREET4 in the last 168 hours.  BNPNo results for input(s): BNP, PROBNP in the last 168 hours.  DDimer No results for input(s): DDIMER in the last 168 hours.   Radiology    No results found.  Cardiac Studies   Echocardiogram 06/05/21  1. Left ventricular ejection fraction, by estimation, is 45 to 50%. The  left ventricle has mildly decreased function. The left ventricle  demonstrates global hypokinesis. The left ventricular internal cavity size  was moderately dilated. There is mild  left ventricular hypertrophy. Left  ventricular diastolic parameters are  indeterminate.   2. Right ventricular systolic function is normal. The right ventricular  size is normal. There is severely elevated pulmonary artery systolic  pressure.   3. Left atrial size was severely dilated.   4. Right atrial size was mildly dilated.   5. The mitral valve is abnormal. Mild mitral valve regurgitation. No  evidence of mitral stenosis.   6. The tricuspid valve is abnormal.   7. The aortic valve is tricuspid. Aortic valve regurgitation is not  visualized. No aortic stenosis is present.   8. The inferior vena cava is normal in size with greater than 50%  respiratory variability, suggesting right atrial pressure of 3 mmHg.   Patient Profile     46 y.o. 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 Erin Fulling.  Assessment & Plan    Acute on chronic combined systolic and diastolic heart failure  Pulmonary HTN (mild on R/L heart cath 5/30)  - Echocardiogram 06/05/21 showed EF mildly down to 45-50%, mild LVH (full report above)  - Underwent R/L heart cath on 06/19/21 that showed 40-50% proximal right coronary narrowing, otherwise normal coronary arteries. Also showed significant elevation in LVEDP, consistent with acute on chronic combince systolic and diastolic heart failure. 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 EF 45-50%  -  Needs diuresis, currently on IV lasix 80 mg BID. Currently net -7.4 L since admission. Renal function stable  - BP soft, will limit GDMT -- add/titrate as able  - Continue spironolactone 12.5 mg daily  - continue metoprolol 12.5 mg daily  - bp remains low normal - no room for Entresto  Acute Respiratory Failure  - Required intubation, extubated on 5/25 and doing well on room air  - Likely a  combination of heart failure and PNA   Otherwise per primary  - Hospital-acquired PNA  - Recurrent DVT/PE-- on coumadin  - History of anemia secondary to leiomyoma/menorrhagia  - Bridging lovenox and warfarin.    Probably close to d/c - however, INR remains subtherapeutic - may need to consider lovenox bridging.  For questions or updates, please contact [place] Please consult [email or web address] for contact info under   Pixie Casino, MD, FACC, [place] Director of the [facility] Lipid [facility] &  Cardiovascular Risk Reduction Clinic Diplomate of the American Board of Clinical Lipidology Attending Cardiologist  Direct Dial: [phone]  Fax: [phone]  Website:  [email or web address]

## 2021-06-21 NOTE — Plan of Care (Signed)

## 2021-06-22 ENCOUNTER — Other Ambulatory Visit: Payer: Self-pay

## 2021-06-22 DIAGNOSIS — G4733 Obstructive sleep apnea (adult) (pediatric): Secondary | ICD-10-CM | POA: Diagnosis not present

## 2021-06-22 DIAGNOSIS — Z86711 Personal history of pulmonary embolism: Secondary | ICD-10-CM | POA: Diagnosis not present

## 2021-06-22 DIAGNOSIS — I5033 Acute on chronic diastolic (congestive) heart failure: Secondary | ICD-10-CM | POA: Diagnosis not present

## 2021-06-22 LAB — CBC
HCT: 29.9 % — ABNORMAL LOW (ref 36.0–46.0)
Hemoglobin: 8.3 g/dL — ABNORMAL LOW (ref 12.0–15.0)
MCH: 20.1 pg — ABNORMAL LOW (ref 26.0–34.0)
MCHC: 27.8 g/dL — ABNORMAL LOW (ref 30.0–36.0)
MCV: 72.6 fL — ABNORMAL LOW (ref 80.0–100.0)
Platelets: 327 10*3/uL (ref 150–400)
RBC: 4.12 MIL/uL (ref 3.87–5.11)
RDW: 26.9 % — ABNORMAL HIGH (ref 11.5–15.5)
WBC: 7.3 10*3/uL (ref 4.0–10.5)
nRBC: 0 % (ref 0.0–0.2)

## 2021-06-22 LAB — PROTIME-INR
INR: 1.3 — ABNORMAL HIGH (ref 0.8–1.2)
Prothrombin Time: 15.9 seconds — ABNORMAL HIGH (ref 11.4–15.2)

## 2021-06-22 MED ORDER — SPIRONOLACTONE 25 MG PO TABS: 12.5000 mg | ORAL_TABLET | Freq: Every day | ORAL | 3 refills | Status: DC

## 2021-06-22 MED ORDER — METOPROLOL SUCCINATE ER 25 MG PO TB24: 12.5000 mg | ORAL_TABLET | Freq: Every day | ORAL | 0 refills | Status: DC

## 2021-06-22 MED ORDER — FUROSEMIDE 80 MG PO TABS: 80.0000 mg | ORAL_TABLET | Freq: Every day | ORAL | 3 refills | Status: DC

## 2021-06-22 MED ORDER — WARFARIN SODIUM 5 MG PO TABS: 15.0000 mg | ORAL_TABLET | Freq: Once | ORAL | Status: DC

## 2021-06-22 MED ORDER — FUROSEMIDE 40 MG PO TABS: 80.0000 mg | ORAL_TABLET | Freq: Every day | ORAL | Status: DC

## 2021-06-22 MED ORDER — ENOXAPARIN SODIUM 120 MG/0.8ML IJ SOSY: 120.0000 mg | PREFILLED_SYRINGE | Freq: Two times a day (BID) | INTRAMUSCULAR | 0 refills | Status: DC

## 2021-06-22 NOTE — TOC Benefit Eligibility Note (Signed)
Transition of Care [facility]) Benefit Eligibility Note    Patient Details  Name: [name] [name] MRN: [id number] Date of Birth: 03-01-1975   Medication/Dose: Please check Lovenox 120 mg SQ twice a day.  Covered?: Yes     Prescription Coverage Preferred Pharmacy: local  Spoke with Person/Company/Phone Number:: Osie Bond [employer]  Co-Pay: ?  Prior Approval: No  Deductible: Met  Additional Notes: Pt has [employer] Medicare through [employer] and Medicaid through [employer] for coverage Ariel said best way to get correct is to submit to her [place], [place] Phone Number: 06/22/2021, 11:12 AM

## 2021-06-22 NOTE — Progress Notes (Signed)
[place] for Warfarin Indication: Hx of PE/DVT  Allergies  Allergen Reactions   Sulfa Antibiotics Itching, Swelling and Rash    Lips swelling    Patient Measurements: Height: '5\' 10"'$  (177.8 cm) (per ECHO) Weight: 124.7 kg (274 lb 14.6 oz) IBW/kg (Calculated) : 68.5  Vital Signs: Temp: 98.7 F (37.1 C) (06/02 0306) Temp Source: Oral (06/02 0306) BP: 99/63 (06/02 0306) Pulse Rate: 80 (06/02 0306)  Labs: Recent Labs    06/19/21 1057 06/20/21 0540 06/21/21 0539 06/22/21 0503  HGB 10.5* 8.1* 8.2*  --   HCT 31.0* 29.3* 30.4*  --   PLT  --  305 298  --   LABPROT  --  14.4 15.1 15.9*  INR  --  1.1 1.2 1.3*  CREATININE  --   --  0.68  --     Estimated Creatinine Clearance: 128.9 mL/min (by C-G formula based on SCr of 0.68 mg/dL).  Medications: Warfarin PTA Per Coumadin Clinic notes- she takes 10 mg PO daily except '15mg'$  on Tues and Sat  Assessment: 46 yo F on warfarin PTA for hx DVT/PE. Warfarin was held from 5/26 - 5/29 and patient was anticoagulated with heparin drip prior to cardiac cath. Pt underwent left and right heart cardiac cath on 5/30. Pharmacy was consulted to resume warfarin on 5/30. Bridge therapy transitioned from IV Heparin to LMWH post-cath.  INR was 3.3 on admission.   Today, 06/22/2021 INR = 1.3 remains subtherapeutic s/p recent resumption of coumadin therapy.  CBC: Hgb low but stable; Plt WNL & stable Noted that patient has chronic anemia Diet: Heart Healthy, 50% meal intake charted Drug interactions: None significant  Goal of Therapy:  INR 2-3   Plan:  Warfarin '15mg'$  po x 1 Continue enoxaparin bridge of 120 mg (1 mg/kg) subQ q12h while INR subtherapeutic INR daily, CBC q 72 hours Monitory renal function, s/sx bleeding  Lorenso Courier, PharmD Clinical Pharmacist 06/22/2021 7:45 AM

## 2021-06-22 NOTE — Progress Notes (Signed)
Nutrition Education Note  RD consulted for nutrition education regarding new onset CHF.  RD provided "Low Sodium Nutrition Therapy" handout from the Academy of Nutrition and Dietetics. Reviewed patient's dietary recall. Provided examples on ways to decrease sodium intake in diet. Discouraged intake of processed foods and use of salt shaker. Encouraged fresh fruits and vegetables as well as whole grain sources of carbohydrates to maximize fiber intake.   RD discussed why it is important for patient to adhere to diet recommendations, and emphasized the role of fluids, foods to avoid, and importance of weighing self daily. Teach back method used.  Expect good compliance.  Body mass index is 39.45 kg/m. Pt meets criteria for obesity based on current BMI.  Current diet order is Heart Healthy, patient is consuming approximately 75% of meals at this time. Labs and medications reviewed. No further nutrition interventions warranted at this time.  If additional nutrition issues arise, please re-consult RD.   [name] Bibles, MS, RD, LDN Inpatient Clinical Dietitian Contact information available via Amion

## 2021-06-22 NOTE — Patient Outreach (Signed)
[place] [facility]) Care Management  06/22/2021  [name] [name] 15-Jul-1975 [id number]   Received hospital referral from Netta Cedars, RN for RN case manager for complex case management services. Assigned patient to Joellyn Quails, RN care coordinator for follow up.  [place] Management Assistant [phone]

## 2021-06-22 NOTE — Progress Notes (Signed)
Progress Note  Patient Name: [name] [name] Date of Encounter: 06/22/2021  [facility] Cardiologist: Carlyle Dolly, MD   Subjective   Recorded net negative 1L yesterday - overall 8.2L negative.  Weight recorded up 2 kg? Not accurate. Labs stable. LP(a) resulted negative. INR 1.3 today.  Inpatient Medications    Scheduled Meds:  enoxaparin (LOVENOX) injection  120 mg Subcutaneous Q12H   furosemide  80 mg Intravenous BID   metoprolol succinate  12.5 mg Oral Daily   sodium chloride flush  3 mL Intravenous Q12H   spironolactone  12.5 mg Oral Daily   warfarin  15 mg Oral ONCE-1600   Warfarin - Pharmacist Dosing Inpatient   Does not apply q1600   Continuous Infusions:  sodium chloride     PRN Meds: sodium chloride, acetaminophen, ondansetron (ZOFRAN) IV, ondansetron (ZOFRAN) IV, sodium chloride flush   Vital Signs    Vitals:   06/21/21 1317 06/21/21 1923 06/22/21 0306 06/22/21 0457  BP:  116/72 99/63   Pulse:  79 80   Resp:  (!) 21 16   Temp:  97.8 F (36.6 C) 98.7 F (37.1 C)   TempSrc:  Oral Oral   SpO2:  100% 91%   Weight: 122.1 kg   124.7 kg  Height:        Intake/Output Summary (Last 24 hours) at 06/22/2021 0927 Last data filed at 06/22/2021 0900 Gross per 24 hour  Intake 840 ml  Output 1700 ml  Net -860 ml       06/22/2021    4:57 AM 06/21/2021    1:17 PM 06/19/2021    5:00 AM  Last 3 Weights  Weight (lbs) 274 lb 14.6 oz 269 lb 3.2 oz 273 lb 9.5 oz  Weight (kg) 124.7 kg 122.108 kg 124.1 kg      Telemetry    Sinus rhythm - Personally Reviewed  ECG    No new tracings - Personally Reviewed  Physical Exam   GEN: No acute distress.   Neck: no JVD Cardiac: RRR, no murmurs, rubs, or gallops.  Respiratory: Clear to auscultation bilaterally. GI: Soft, nontender, non-distended  MS: No edema; No deformity. Neuro:  Nonfocal  Psych: Normal affect   Labs    High Sensitivity Troponin:   Recent Labs  Lab 06/04/21 2321 06/12/21 1409  TROPONINIHS  13 8     Chemistry Recent Labs  Lab 06/15/21 1019 06/19/21 0520 06/19/21 1049 06/19/21 1052 06/19/21 1057 06/21/21 0539  NA 142 138   < > 138 134* 138  K 3.7 3.1*   < > 3.6 3.4* 3.8  CL 103 100  --   --   --  104  CO2 30 30  --   --   --  29  GLUCOSE 109* 90  --   --   --  86  BUN 24* 16  --   --   --  13  CREATININE 0.67 0.68  --   --   --  0.68  CALCIUM 9.5 8.9  --   --   --  9.2  MG  --  1.9  --   --   --   --   GFRNONAA >60 >60  --   --   --  >60  ANIONGAP 9 8  --   --   --  5   < > = values in this interval not displayed.    Lipids  Recent Labs  Lab 06/16/21 0531  TRIG 52  Hematology Recent Labs  Lab 06/20/21 0540 06/21/21 0539 06/22/21 0503  WBC 7.6 6.2 7.3  RBC 4.09 4.13 4.12  HGB 8.1* 8.2* 8.3*  HCT 29.3* 30.4* 29.9*  MCV 71.6* 73.6* 72.6*  MCH 19.8* 19.9* 20.1*  MCHC 27.6* 27.0* 27.8*  RDW 26.0* 26.3* 26.9*  PLT 305 298 327   Thyroid No results for input(s): TSH, FREET4 in the last 168 hours.  BNPNo results for input(s): BNP, PROBNP in the last 168 hours.  DDimer No results for input(s): DDIMER in the last 168 hours.   Radiology    No results found.  Cardiac Studies   Echocardiogram 06/05/21  1. Left ventricular ejection fraction, by estimation, is 45 to 50%. The  left ventricle has mildly decreased function. The left ventricle  demonstrates global hypokinesis. The left ventricular internal cavity size  was moderately dilated. There is mild  left ventricular hypertrophy. Left ventricular diastolic parameters are  indeterminate.   2. Right ventricular systolic function is normal. The right ventricular  size is normal. There is severely elevated pulmonary artery systolic  pressure.   3. Left atrial size was severely dilated.   4. Right atrial size was mildly dilated.   5. The mitral valve is abnormal. Mild mitral valve regurgitation. No  evidence of mitral stenosis.   6. The tricuspid valve is abnormal.   7. The aortic valve is tricuspid.  Aortic valve regurgitation is not  visualized. No aortic stenosis is present.   8. The inferior vena cava is normal in size with greater than 50%  respiratory variability, suggesting right atrial pressure of 3 mmHg.   Patient Profile     46 y.o. 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 Erin Fulling.  Assessment & Plan    Acute on chronic combined systolic and diastolic heart failure  Pulmonary HTN (mild on R/L heart cath 5/30)  - Echocardiogram 06/05/21 showed EF mildly down to 45-50%, mild LVH (full report above)  - Underwent R/L heart cath on 06/19/21 that showed 40-50% proximal right coronary narrowing, otherwise normal coronary arteries. Also showed significant elevation in LVEDP, consistent with acute on chronic combince systolic and diastolic heart failure. 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 EF 45-50%  - Appears euvolemic - switch to lasix 80 mg po daily, starting tomorrow. 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    Probably close to d/c - however, INR remains subtherapeutic - may need to consider lovenox bridging.  [facility] will sign off.   Medication Recommendations:  as above Other recommendations (labs, testing, etc):  none Follow up as an outpatient:  Dr. Carlyle Dolly or APP   For questions or updates, please contact [facility] Please consult  [email or web address] for contact info under   Pixie Casino, MD, FACC, [place] Director of the [facility]  Cardiovascular Risk Reduction  Clinic Diplomate of the American Board of Clinical Lipidology Attending Cardiologist  Direct Dial: [phone]  Fax: [phone]  Website:  [email or web address]

## 2021-06-22 NOTE — Discharge Instructions (Signed)
You need coumadin level check early next week,. To determine if you need to continue to use Lovenox.

## 2021-06-22 NOTE — Discharge Summary (Signed)
Physician Discharge Summary   Patient: [name] [name] MRN: [id number] DOB: Jul 28, 1975  Admit date:     06/12/2021  Discharge date: 06/22/21  Discharge Physician: Elmarie Shiley   PCP: Carrolyn Meiers, MD   Recommendations at discharge:    Needs INR to determine if she will need more Lovenox bridge.  Needs to follow up with Cardiology for management of HF>   Discharge Diagnoses: Principal Problem:   Acute on chronic diastolic CHF (congestive heart failure) (HCC) Active Problems:   Chronic pain syndrome   Aspiration pneumonia (HCC)   OSA on CPAP   Tobacco use disorder   Severe Iron deficiency anemia   Morbid obesity ([place])   History of pulmonary embolism   Acute respiratory failure with hypoxia (HCC)   Acute on chronic systolic congestive heart failure ([place])   Pulmonary edema   Dysphagia  Resolved Problems:   * No resolved hospital problems. *  Hospital Course: 46 year old with past medical history significant for hypertension, history of DVT/PE on lifelong anticoagulation, pulmonary hypertension, OSA on CPAP, history of anemia due to menorrhagia Khalia myeloma..  Patient had a recurrent DVT January 2023, evaluated by vascular and underwent thrombectomy.  Recent admission 5/16 until 5/18 due to community-acquired pneumonia and acute on chronic diastolic heart failure exacerbation.  Echo 05/1618 23 ejection fraction 45 to 50% global hypokinesis.  She was compliant with diuretic.  She presented with worsening shortness of breath, admitted for recurrent heart failure exacerbation.  Patient was intubated  in the ED at [facility] and transferred to [facility] for further care.  Patient was able to be extubated 06/14/2021 and oxygen requirement weaned down to room air. Cardiology consulted underwent heart cath 5/31, which showed 20 to 30% proximal circumflex, coronary otherwise normal.  Elevated end-diastolic pressure of 31 consistent with acute on chronic combined systolic  and diastolic heart failure.  Ejection fraction 45 to 50%.  Pulmonary hypertension 30 mmHg.     Assessment and Plan:   1-Acute on Chronic Combined Systolic and Diastolic Heart Failure: -Recent echo show ejection fraction mildly down to 45 to 50% by echo. -Right and left heart cath 5/30: Which showed nonobstructive disease and mild pulmonary hypertension. -Currently off of oxygen -Continue with IV Lasix. -Cardiology  is adjusting medications for heart failure, started  spironolactone and beta-blockers 5/31. -Negative 8 L.  -Stab el for discharge on 80 mg lasix, spironolactone and metoprolol.   2-Acute Hypoxic Respiratory Failure: In the setting of pneumonia and heart failure exacerbation. Intubated on admission at [facility] ED. Extubated 5/25. Treated with diuresis.     3-Hospital-acquired pneumonia: Completed IV antibiotics.    4-Pulmonary HTN;  S/P Right heart cath: Mild Pulmonary HTN.    5-HTN; Lasix change to oral.  Started on metoprolol.    History of anemia secondary to leiomyoma/menorrhagia: Monitor hemoglobin. Down to 8.  Hb stable.    History of PE/DVT Continue with Lovenox/coumadin.  She will need lovenox at discharge.  INR 1.3   Nutrition Problem: Swallowing difficulty Etiology: dysphagia       Signs/Symptoms: other (comment) (need for dysphagia 3, nectar-thick liquids diet)            Consultants: Cardiology Procedures performed: [place] Cath Disposition: Home Diet recommendation:  Discharge Diet Orders (From admission, onward)     Start     Ordered   06/22/21 0000  Diet - low sodium heart healthy        06/22/21 1013  Cardiac diet DISCHARGE MEDICATION: Allergies as of 06/22/2021       Reactions   Sulfa Antibiotics Itching, Swelling, Rash   Lips swelling        Medication List     STOP taking these medications    amLODipine 10 MG tablet Commonly known as: NORVASC   cefdinir 300 MG capsule Commonly known as:  OMNICEF   guaiFENesin 600 MG 12 hr tablet Commonly known as: Mucinex   triamterene-hydrochlorothiazide 37.5-25 MG tablet Commonly known as: MAXZIDE-25       TAKE these medications    albuterol 108 (90 Base) MCG/ACT inhaler Commonly known as: VENTOLIN HFA Inhale 2 puffs into the lungs every 2 (two) hours as needed for wheezing or shortness of breath.   enoxaparin 120 MG/0.8ML injection Commonly known as: LOVENOX Inject 0.8 mLs (120 mg total) into the skin every 12 (twelve) hours for 7 days.   ferrous sulfate 325 (65 FE) MG EC tablet Take 1 tablet (325 mg total) by mouth daily with breakfast.   furosemide 80 MG tablet Commonly known as: LASIX Take 1 tablet (80 mg total) by mouth daily. Start taking on: June 23, 2021 What changed:  medication strength how much to take   metoprolol succinate 25 MG 24 hr tablet Commonly known as: TOPROL-XL Take 0.5 tablets (12.5 mg total) by mouth daily.   nicotine 14 mg/24hr patch Commonly known as: NICODERM CQ - dosed in mg/24 hours Place 1 patch (14 mg total) onto the skin daily as needed (nicotine craving).   spironolactone 25 MG tablet Commonly known as: ALDACTONE Take 0.5 tablets (12.5 mg total) by mouth daily.   warfarin 10 MG tablet Commonly known as: COUMADIN Take as directed. If you are unsure how to take this medication, talk to your nurse or doctor. Original instructions: TAKE (1) OR (1) 1/2 TABLET BY MOUTH DAILY AS DIRECTED. What changed:  how much to take how to take this when to take this additional instructions        Follow-up Information     Fanta, Normajean Baxter, MD Follow up in 1 week(s).   Specialty: Internal Medicine Contact information: [place] [address] [phone]         Arnoldo Lenis, MD .   Specialty: Cardiology Contact information: [address] [place] [address] [phone]                Discharge Exam: Danley Danker Weights   06/19/21  0500 06/21/21 1317 06/22/21 0457  Weight: 124.1 kg 122.1 kg 124.7 kg   General; NAD Lung; CTA  Condition at discharge: stable  The results of significant diagnostics from this hospitalization (including imaging, microbiology, ancillary and laboratory) are listed below for reference.   Imaging Studies: DG Chest 2 View  Result Date: 06/07/2021 CLINICAL DATA:  Cough EXAM: CHEST - 2 VIEW COMPARISON:  06/04/2021 FINDINGS: Stable heart size. Mild pulmonary vascular congestion with bilateral perihilar and bibasilar interstitial opacities. Slightly improving aeration within the left lung base compared to prior. No pleural effusion or pneumothorax. IMPRESSION: Persistent bilateral interstitial opacities with slightly improving aeration within the left lung base compared to prior. Electronically Signed   By: Davina Poke D.O.   On: 06/07/2021 08:24   DG Chest 2 View  Result Date: 06/04/2021 CLINICAL DATA:  Shortness of breath for 3 days with productive cough. Intermittent chest pain and lethargy. EXAM: CHEST - 2 VIEW COMPARISON:  Radiographs 04/27/2013. Chest CT 10/21/2013. Abdominal CT 01/30/2021. FINDINGS: The heart  size and mediastinal contours are normal. There is vascular congestion with new interstitial opacities in both lungs, diffuse fissural thickening and patchy left lower lobe airspace disease. No pneumothorax or significant pleural effusion. The bones appear unchanged. There are degenerative changes throughout the thoracic spine. IMPRESSION: Vascular congestion with diffuse interstitial opacities, patchy left lower lobe airspace disease and diffuse interstitial thickening. Findings may be secondary to pulmonary edema or atypical infection. Electronically Signed   By: Richardean Sale M.D.   On: 06/04/2021 14:10   CT Angio Chest PE W and/or Wo Contrast  Result Date: 06/05/2021 CLINICAL DATA:  Pulmonary embolism (PE) suspected, high prob. Productive cough, shortness of breath, occasional  chest pain EXAM: CT ANGIOGRAPHY CHEST WITH CONTRAST TECHNIQUE: Multidetector CT imaging of the chest was performed using the standard protocol during bolus administration of intravenous contrast. Multiplanar CT image reconstructions and MIPs were obtained to evaluate the vascular anatomy. RADIATION DOSE REDUCTION: This exam was performed according to the departmental dose-optimization program which includes automated exposure control, adjustment of the mA and/or kV according to patient size and/or use of iterative reconstruction technique. CONTRAST:  176m OMNIPAQUE IOHEXOL 350 MG/ML SOLN COMPARISON:  10/21/2013 FINDINGS: Cardiovascular: Heart is normal size. Aorta is normal caliber. No filling defects in the pulmonary arteries to suggest pulmonary emboli. Mediastinum/Nodes: No mediastinal, hilar, or axillary adenopathy. Trachea and esophagus are unremarkable. Thyroid unremarkable. Lungs/Pleura: Ground-glass opacities in the mid and lower lungs. This could reflect edema or atypical infection/pneumonitis. No effusions. Upper Abdomen: No acute findings Musculoskeletal: Chest wall soft tissues are unremarkable. No acute bony abnormality. Review of the MIP images confirms the above findings. IMPRESSION: No evidence of pulmonary embolus. Ground-glass opacities in the mid and lower lungs, left greater than right. This could reflect edema or pneumonia/pneumonitis. Electronically Signed   By: KRolm BaptiseM.D.   On: 06/05/2021 01:15   CARDIAC CATHETERIZATION  Result Date: 06/19/2021 CONCLUSIONS: 40 to 50% proximal right coronary narrowing. Right dominant anatomy Coronary arteries otherwise normal Significant elevation LVEDP 30 mmHg consistent with acute on chronic combined systolic and diastolic heart failure Mild pulmonary hypertension, mean pressure 30 mmHg.  WHO group II etiology based on hemodysnamics.. Capillary wedge mean 24 mmHg.  V wave to 40 mmHg suggesting some degree of mitral regurgitation. Cardiac output  8.5 L/min with index 3.57 L/min/m. Pulmonary resistance 0.7 Wood units. RECOMMENDATIONS: Care with IV fluid administration. Needs diuresis. Start heart failure therapy including ARNI and SGLT2 as tolerated.  DG Chest Port 1 View  Result Date: 06/15/2021 CLINICAL DATA:  Pulmonary edema. EXAM: PORTABLE CHEST 1 VIEW COMPARISON:  Jun 14, 2021. FINDINGS: Stable cardiomediastinal silhouette. Bilateral perihilar and basilar opacities are noted concerning for pulmonary edema or possibly infiltrate. Bony thorax is unremarkable. Endotracheal and nasogastric tubes have been removed. IMPRESSION: Bilateral perihilar and basilar opacities are noted concerning for pulmonary edema or possibly infiltrates. Electronically Signed   By: JMarijo ConceptionM.D.   On: 06/15/2021 08:36   DG Chest Port 1 View  Result Date: 06/14/2021 CLINICAL DATA:  A 46year old female presents for evaluation of pneumonia. EXAM: PORTABLE CHEST 1 VIEW COMPARISON:  Jun 13, 2021. FINDINGS: EKG leads project over the chest. Endotracheal tube approximately 5.5 cm from the carina, tip between clavicular heads. Previously approximately 3 cm from the carina. Gastric tube courses through in off the field of the radiograph. Appearance of the chest is stable otherwise when compared to the previous exam with graded opacity in the LEFT and RIGHT chest and obscured LEFT and RIGHT  hemidiaphragms with increased interstitial markings. On limited assessment there is no acute skeletal finding. IMPRESSION: 1. Endotracheal tube approximately 5.5 cm from the carina, tip between clavicular heads. This may have been retracted slightly since previous imaging. Attention on follow-up, could consider approximately 1 cm advancement as warranted for more optimal placement. 2. Stable appearance of interstitial prominence, basilar airspace disease and bilateral effusions. These results will be called to the ordering clinician or representative by the Radiologist Assistant, and  communication documented in the PACS or [employer]. Electronically Signed   By: Zetta Bills M.D.   On: 06/14/2021 08:07   DG Chest Port 1 View  Result Date: 06/13/2021 CLINICAL DATA:  Pneumonia follow-up EXAM: PORTABLE CHEST 1 VIEW COMPARISON:  06/12/2021 FINDINGS: Endotracheal and enteric tubes are again identified. Similar cardiomegaly. Patchy pulmonary opacities remain present. Aeration is slightly improved. No significant pleural effusions. IMPRESSION: Persistent patchy bilateral pulmonary opacities with some improvement in aeration since 06/12/2021. Electronically Signed   By: Macy Mis M.D.   On: 06/13/2021 11:08   DG CHEST PORT 1 VIEW  Result Date: 06/12/2021 CLINICAL DATA:  Provided history: Pulmonary edema, acute. EXAM: PORTABLE CHEST 1 VIEW COMPARISON:  Chest radiographs 06/07/2021. CT angiogram chest 06/05/2021. FINDINGS: Interval intubation. The ET tube terminates at the level of the clavicular heads. Interval placement of an enteric tube. The enteric tube passes below the level of the left hemidiaphragm with tip excluded from the field of view. Cardiomegaly. Interstitial and ill-defined airspace opacities throughout both lungs, progressed from the prior chest radiographs of 06/07/2021. No definite pleural effusion or evidence of pneumothorax. No acute bony abnormality identified. IMPRESSION: Support apparatus as described. Cardiomegaly. Interstitial and ill-defined airspace opacities throughout both lungs, progressed from the prior chest radiographs of 06/07/2021. Primary differential considerations include pulmonary edema or atypical/viral pneumonia. Electronically Signed   By: Kellie Simmering D.O.   On: 06/12/2021 13:55   ECHOCARDIOGRAM COMPLETE  Result Date: 06/05/2021    ECHOCARDIOGRAM REPORT   Patient Name:   JORDON BOURQUIN Date of Exam: 06/05/2021 Medical Rec #:  [id number]       Height:       70.0 in Accession #:    [id number]      Weight:       282.4 lb Date of Birth:   September 20, 1975       BSA:          2.417 m Patient Age:    2 years        BP:           115/65 mmHg Patient Gender: F               HR:           84 bpm. Exam Location:  [facility] Procedure: 2D Echo, Cardiac Doppler, Color Doppler and Intracardiac            Opacification Agent Indications:    CHF  History:        Patient has prior history of Echocardiogram examinations, most                 recent 09/03/2017. CHF, Pulmonary HTN, Signs/Symptoms:Shortness                 of Breath; Risk Factors:Hypertension and Current Smoker. Saddle                 embolus of Pulmonary artery, S/P IVC filter.  Sonographer:    Wenda Low Referring Phys: [id number] IRS  A MANSY IMPRESSIONS  1. Left ventricular ejection fraction, by estimation, is 45 to 50%. The left ventricle has mildly decreased function. The left ventricle demonstrates global hypokinesis. The left ventricular internal cavity size was moderately dilated. There is mild left ventricular hypertrophy. Left ventricular diastolic parameters are indeterminate.  2. Right ventricular systolic function is normal. The right ventricular size is normal. There is severely elevated pulmonary artery systolic pressure.  3. Left atrial size was severely dilated.  4. Right atrial size was mildly dilated.  5. The mitral valve is abnormal. Mild mitral valve regurgitation. No evidence of mitral stenosis.  6. The tricuspid valve is abnormal.  7. The aortic valve is tricuspid. Aortic valve regurgitation is not visualized. No aortic stenosis is present.  8. The inferior vena cava is normal in size with greater than 50% respiratory variability, suggesting right atrial pressure of 3 mmHg. FINDINGS  Left Ventricle: Left ventricular ejection fraction, by estimation, is 45 to 50%. The left ventricle has mildly decreased function. The left ventricle demonstrates global hypokinesis. Definity contrast agent was given IV to delineate the left ventricular  endocardial borders. The left ventricular  internal cavity size was moderately dilated. There is mild left ventricular hypertrophy. Left ventricular diastolic parameters are indeterminate. Right Ventricle: The right ventricular size is normal. Right vetricular wall thickness was not well visualized. Right ventricular systolic function is normal. There is severely elevated pulmonary artery systolic pressure. The tricuspid regurgitant velocity is 3.64 m/s, and with an assumed right atrial pressure of 8 mmHg, the estimated right ventricular systolic pressure is 78.5 mmHg. Left Atrium: Left atrial size was severely dilated. Right Atrium: Right atrial size was mildly dilated. Pericardium: There is no evidence of pericardial effusion. Mitral Valve: The mitral valve is abnormal. Mild mitral valve regurgitation. No evidence of mitral valve stenosis. MV peak gradient, 10.0 mmHg. The mean mitral valve gradient is 3.0 mmHg. Tricuspid Valve: The tricuspid valve is abnormal. Tricuspid valve regurgitation is mild . No evidence of tricuspid stenosis. Aortic Valve: The aortic valve is tricuspid. Aortic valve regurgitation is not visualized. No aortic stenosis is present. Aortic valve mean gradient measures 4.0 mmHg. Aortic valve peak gradient measures 7.4 mmHg. Aortic valve area, by VTI measures 3.22 cm. Pulmonic Valve: The pulmonic valve was not well visualized. Pulmonic valve regurgitation is not visualized. No evidence of pulmonic stenosis. Aorta: The aortic root is normal in size and structure. Venous: The inferior vena cava is normal in size with greater than 50% respiratory variability, suggesting right atrial pressure of 3 mmHg. IAS/Shunts: No atrial level shunt detected by color flow Doppler.  LEFT VENTRICLE PLAX 2D LVIDd:         6.60 cm      Diastology LVIDs:         5.00 cm      LV e' medial:    9.03 cm/s LV PW:         1.30 cm      LV E/e' medial:  14.4 LV IVS:        1.20 cm      LV e' lateral:   9.36 cm/s LVOT diam:     2.20 cm      LV E/e' lateral: 13.9 LV  SV:         86 LV SV Index:   36 LVOT Area:     3.80 cm  LV Volumes (MOD) LV vol d, MOD A4C: 167.0 ml LV vol s, MOD A4C: 72.6 ml LV SV MOD A4C:  167.0 ml RIGHT VENTRICLE RV Basal diam:  4.45 cm RV S prime:     11.40 cm/s LEFT ATRIUM              Index        RIGHT ATRIUM           Index LA diam:        5.60 cm  2.32 cm/m   RA Area:     22.20 cm LA Vol (A2C):   127.0 ml 52.54 ml/m  RA Volume:   72.00 ml  29.79 ml/m LA Vol (A4C):   136.0 ml 56.27 ml/m LA Biplane Vol: 132.0 ml 54.61 ml/m  AORTIC VALVE                    PULMONIC VALVE AV Area (Vmax):    3.19 cm     PV Vmax:       0.63 m/s AV Area (Vmean):   3.11 cm     PV Peak grad:  1.6 mmHg AV Area (VTI):     3.22 cm AV Vmax:           136.00 cm/s AV Vmean:          89.800 cm/s AV VTI:            0.268 m AV Peak Grad:      7.4 mmHg AV Mean Grad:      4.0 mmHg LVOT Vmax:         114.00 cm/s LVOT Vmean:        73.500 cm/s LVOT VTI:          0.227 m LVOT/AV VTI ratio: 0.85  AORTA Ao Root diam: 2.90 cm MITRAL VALVE                TRICUSPID VALVE MV Area (PHT): 3.83 cm     TR Peak grad:   53.0 mmHg MV Area VTI:   2.47 cm     TR Vmax:        364.00 cm/s MV Peak grad:  10.0 mmHg MV Mean grad:  3.0 mmHg     SHUNTS MV Vmax:       1.58 m/s     Systemic VTI:  0.23 m MV Vmean:      78.9 cm/s    Systemic Diam: 2.20 cm MV Decel Time: 198 msec MV E velocity: 130.00 cm/s MV A velocity: 60.40 cm/s MV E/A ratio:  2.15 Carlyle Dolly MD Electronically signed by Carlyle Dolly MD Signature Date/Time: 06/05/2021/12:14:52 PM    Final     Microbiology: Results for orders placed or performed during the hospital encounter of 06/12/21  MRSA Next Gen by PCR, Nasal     Status: None   Collection Time: 06/12/21  1:11 PM   Specimen: Nasal Mucosa; Nasal Swab  Result Value Ref Range Status   MRSA by PCR Next Gen NOT DETECTED NOT DETECTED Final    Comment: (NOTE) The GeneXpert MRSA Assay (FDA approved for NASAL specimens only), is one component of a comprehensive MRSA  colonization surveillance program. It is not intended to diagnose MRSA infection nor to guide or monitor treatment for MRSA infections. Test performance is not FDA approved in patients less than 49 years old. Performed at [facility], [place] [address], [place], [place] [address]   Respiratory (~20 pathogens) panel by PCR     Status: None   Collection Time: 06/12/21  2:37 PM   Specimen: Nasopharyngeal Swab;  Respiratory  Result Value Ref Range Status   Adenovirus NOT DETECTED NOT DETECTED Final   Coronavirus 229E NOT DETECTED NOT DETECTED Final    Comment: (NOTE) The Coronavirus on the Respiratory Panel, DOES NOT test for the novel  Coronavirus (2019 nCoV)    Coronavirus HKU1 NOT DETECTED NOT DETECTED Final   Coronavirus NL63 NOT DETECTED NOT DETECTED Final   Coronavirus OC43 NOT DETECTED NOT DETECTED Final   Metapneumovirus NOT DETECTED NOT DETECTED Final   Rhinovirus / Enterovirus NOT DETECTED NOT DETECTED Final   Influenza A NOT DETECTED NOT DETECTED Final   Influenza B NOT DETECTED NOT DETECTED Final   Parainfluenza Virus 1 NOT DETECTED NOT DETECTED Final   Parainfluenza Virus 2 NOT DETECTED NOT DETECTED Final   Parainfluenza Virus 3 NOT DETECTED NOT DETECTED Final   Parainfluenza Virus 4 NOT DETECTED NOT DETECTED Final   Respiratory Syncytial Virus NOT DETECTED NOT DETECTED Final   Bordetella pertussis NOT DETECTED NOT DETECTED Final   Bordetella Parapertussis NOT DETECTED NOT DETECTED Final   Chlamydophila pneumoniae NOT DETECTED NOT DETECTED Final   Mycoplasma pneumoniae NOT DETECTED NOT DETECTED Final    Comment: Performed at [facility], [place]. [address], [place], [place] [address]  Culture, Respiratory w Gram Stain     Status: None   Collection Time: 06/12/21  3:23 PM   Specimen: Tracheal Aspirate; Respiratory  Result Value Ref Range Status   Specimen Description   Final    TRACHEAL ASPIRATE Performed at [facility]  [address] [place], [place] [address]    Special Requests   Final    NONE Performed at [facility], [place] [address], [place], [place] [address]    Gram Stain   Final    FEW WBC PRESENT, PREDOMINANTLY PMN NO ORGANISMS SEEN    Culture   Final    RARE Normal respiratory flora-no Staph aureus or Pseudomonas seen Performed at [facility] [address], [place], [place] [address]    Report Status 06/17/2021 FINAL  Final    Labs: CBC: Recent Labs  Lab 06/18/21 0442 06/19/21 0520 06/19/21 1049 06/19/21 1052 06/19/21 1057 06/20/21 0540 06/21/21 0539 06/22/21 0503  WBC 7.5 7.2  --   --   --  7.6 6.2 7.3  HGB 8.2* 8.3*   < > 10.2* 10.5* 8.1* 8.2* 8.3*  HCT 31.8* 29.3*   < > 30.0* 31.0* 29.3* 30.4* 29.9*  MCV 76.3* 71.1*  --   --   --  71.6* 73.6* 72.6*  PLT 323 331  --   --   --  305 298 327   < > = values in this interval not displayed.   Basic Metabolic Panel: Recent Labs  Lab 06/19/21 0520 06/19/21 1049 06/19/21 1052 06/19/21 1057 06/21/21 0539  NA 138 139  139 138 134* 138  K 3.1* 3.5  3.5 3.6 3.4* 3.8  CL 100  --   --   --  104  CO2 30  --   --   --  29  GLUCOSE 90  --   --   --  86  BUN 16  --   --   --  13  CREATININE 0.68  --   --   --  0.68  CALCIUM 8.9  --   --   --  9.2  MG 1.9  --   --   --   --    Liver Function Tests:  No results for input(s): AST, ALT, ALKPHOS, BILITOT, PROT, ALBUMIN in the last 168 hours. CBG: No results for input(s): GLUCAP in the last 168 hours.  Discharge time spent: greater than 30 minutes.  Signed: Elmarie Shiley, MD [facility] 06/22/2021

## 2021-06-26 ENCOUNTER — Other Ambulatory Visit: Payer: Self-pay | Admitting: *Deleted

## 2021-06-26 NOTE — Patient Outreach (Signed)
[facility]) Care Management  06/26/2021  [name] [name] 11/25/1975 [id number]   [facility] Unsuccessful outreach Outreach attempt to the listed at the preferred outreach number in EPIC  No answer. [facility] RN CM left HIPAA [facility] Portability and Accountability Act) compliant voicemail message along with CM's contact info.           Plan: [facility] RN CM scheduled this patient for another call attempt within 4-7 business days Unsuccessful outreach letter sent on 06/26/21 Unsuccessful outreach on 06/26/21   Joelene Millin L. Lavina Hamman, RN, BSN, [place] Coordinator Office number [phone]

## 2021-06-27 ENCOUNTER — Ambulatory Visit (INDEPENDENT_AMBULATORY_CARE_PROVIDER_SITE_OTHER): Payer: Medicare HMO | Admitting: *Deleted

## 2021-06-27 ENCOUNTER — Other Ambulatory Visit: Payer: Medicare HMO | Admitting: *Deleted

## 2021-06-27 DIAGNOSIS — Z5181 Encounter for therapeutic drug level monitoring: Secondary | ICD-10-CM

## 2021-06-27 DIAGNOSIS — I2782 Chronic pulmonary embolism: Secondary | ICD-10-CM | POA: Diagnosis not present

## 2021-06-27 DIAGNOSIS — Z86711 Personal history of pulmonary embolism: Secondary | ICD-10-CM

## 2021-06-27 DIAGNOSIS — I2602 Saddle embolus of pulmonary artery with acute cor pulmonale: Secondary | ICD-10-CM | POA: Diagnosis not present

## 2021-06-27 LAB — POCT INR: INR: 2 (ref 2.0–3.0)

## 2021-06-27 MED ORDER — WARFARIN SODIUM 10 MG PO TABS: ORAL_TABLET | ORAL | 2 refills | Status: DC

## 2021-06-27 NOTE — Patient Outreach (Signed)
[facility]) Care Management Telephonic RN Care Manager Note   06/27/2021 Name:  [name] [name] MRN:  [id number] DOB:  1975/08/02  Summary: Second unsuccessful outreach for post hospital follow up Outreach to patient successful but she reports issues and disconnected the line in the middle of the HIPAA verification. With a return call to [phone] There was no answer but RN Cm was able to leave a HIPAA ([place]) compliant voicemail message along with CM's contact info.    Subjective: [name] [name] is an 46 y.o. year old female who is a primary patient of Fanta, Normajean Baxter, MD. The care management team was consulted for assistance with care management and/or care coordination needs.    Telephonic RN Care Manager completed Telephone Visit today.  [name] [name] had been admitted 06/12/21 to 06/22/21 for congestive Heart Failure (CHF) She as referred to [facility] on 06/22/21  Objective:  Medications Reviewed Today     Reviewed by Elyn Peers, CPhT (Pharmacy Technician) on 06/14/21 at 2131  Med List Status: Complete   Medication Order Taking? Sig Documenting Provider Last Dose Status Informant  albuterol (VENTOLIN HFA) 108 (90 Base) MCG/ACT inhaler [id number] No Inhale 2 puffs into the lungs every 2 (two) hours as needed for wheezing or shortness of breath. Roxan Hockey, MD 06/11/2021 Active Self  amLODipine (NORVASC) 10 MG tablet [id number] No Take 1 tablet (10 mg total) by mouth daily. Roxan Hockey, MD 06/11/2021 Active Self  cefdinir (OMNICEF) 300 MG capsule [id number]  Take 1 capsule (300 mg total) by mouth 2 (two) times daily for 5 days. Roxan Hockey, MD  Active   ferrous sulfate 325 (65 FE) MG EC tablet [id number] No Take 1 tablet (325 mg total) by mouth daily with breakfast.  Patient not taking: Reported on 06/14/2021   Roxan Hockey, MD Not Taking Active Self  furosemide (LASIX) 40 MG tablet [id number] No Take  1 tablet (40 mg total) by mouth daily for 4 days.  Patient not taking: Reported on 06/14/2021   Roxan Hockey, MD Completed Course Active   guaiFENesin (MUCINEX) 600 MG 12 hr tablet [id number] No Take 1 tablet (600 mg total) by mouth 2 (two) times daily for 10 days.  Patient not taking: Reported on 06/14/2021   Roxan Hockey, MD Completed Course Active Self  nicotine (NICODERM CQ - DOSED IN MG/24 HOURS) 14 mg/24hr patch [id number] No Place 1 patch (14 mg total) onto the skin daily as needed (nicotine craving).  Patient not taking: Reported on 06/14/2021   Roxan Hockey, MD Not Taking Active Self  triamterene-hydrochlorothiazide (MAXZIDE-25) 37.5-25 MG tablet [id number] Yes Take 0.5 tablets by mouth every morning. Take Half a tablet every Morning starting 06/11/21 after you finish Lasix/Furosemide Roxan Hockey, MD 06/11/2021 Active Self  warfarin (COUMADIN) 10 MG tablet [id number] Yes TAKE (1) OR (1) 1/2 TABLET BY MOUTH DAILY AS DIRECTED.  Patient taking differently: Take 10-15 mg by mouth See admin instructions. Take one 10 mg tablet every day except on Tues and Thursdays take 15 mg by mouth (Patient sates she takes a 10 mg tablet and then splits it in half to make total of 15 mg) per patient   Arnoldo Lenis, MD 06/14/2021 0800 Active Self           Med Note (SATTERFIELD, Blanchie Serve Jun 14, 2021  9:30 PM)    Med List Note Clydell Hakim, [place] 06/05/21 0815):  SDOH:  (Social Determinants of Health) assessments and interventions performed:    Care Plan  Review of patient past medical history, allergies, medications, health status, including review of consultants reports, laboratory and other test data, was performed as part of comprehensive evaluation for care management services.   There are no care plans that you recently modified to display for this patient.   Plan: [facility] RN CM scheduled this patient for another call attempt within 4-7 business  days Unsuccessful outreach letter sent on 06/26/21 Unsuccessful outreach on 06/26/21, 06/27/21   Joelene Millin L. Lavina Hamman, RN, BSN, [place] Coordinator Office number [phone]

## 2021-06-27 NOTE — Patient Instructions (Signed)
Take warfarin 1 1/2 tablets tonight then resume 1 tablet daily except 1 1/2 tablets on Tuesdays and Saturdays Recheck in 1 wk

## 2021-06-29 ENCOUNTER — Ambulatory Visit: Payer: Medicare HMO | Admitting: *Deleted

## 2021-07-02 ENCOUNTER — Other Ambulatory Visit: Payer: Self-pay | Admitting: *Deleted

## 2021-07-02 NOTE — Patient Outreach (Addendum)
[facility]) Care Management Telephonic RN Care Manager Note   07/02/2021 Name:  [name] [name] MRN:  [id number] DOB:  1975-06-13  Summary: [facility] Unsuccessful outreach Outreach attempt to the listed at the preferred outreach number in [place] Female child answered. RN CM informed her who RN Cm was and the attempt to speak with Mrs Dinius was requested RN CM heard a female in the background stat"sit down" and the phone disconnected RN CM returned the call No answer at [phone] [facility] RN CM left HIPAA ([place] and Accountability Act) compliant voicemail message along with CM's contact info.    Subjective: [name] [name] is an 46 y.o. year old female who is a primary patient of Fanta, Normajean Baxter, MD. The care management team was consulted for assistance with care management and/or care coordination needs.    Telephonic RN Care Manager completed Telephone Visit today.  Mrs Ellsworth had been admitted 06/12/21 to 06/22/21 for congestive Heart Failure (CHF) She as referred to [facility] on 06/22/21 for post hospital screening for any needs, disease management, care coordination etc  Objective:  Medications Reviewed Today     Reviewed by Elyn Peers CPhT (Pharmacy Technician) on 06/14/21 at 2131  Med List Status: Complete   Medication Order Taking? Sig Documenting Provider Last Dose Status Informant  albuterol (VENTOLIN HFA) 108 (90 Base) MCG/ACT inhaler [id number] No Inhale 2 puffs into the lungs every 2 (two) hours as needed for wheezing or shortness of breath. Roxan Hockey, MD 06/11/2021 Active Self  amLODipine (NORVASC) 10 MG tablet [id number] No Take 1 tablet (10 mg total) by mouth daily. Roxan Hockey, MD 06/11/2021 Active Self  cefdinir (OMNICEF) 300 MG capsule [id number]  Take 1 capsule (300 mg total) by mouth 2 (two) times daily for 5 days. Roxan Hockey, MD  Active   ferrous sulfate 325 (65 FE) MG EC tablet [id number] No Take 1  tablet (325 mg total) by mouth daily with breakfast.  Patient not taking: Reported on 06/14/2021   Roxan Hockey, MD Not Taking Active Self  furosemide (LASIX) 40 MG tablet [id number] No Take 1 tablet (40 mg total) by mouth daily for 4 days.  Patient not taking: Reported on 06/14/2021   Roxan Hockey, MD Completed Course Active   guaiFENesin (MUCINEX) 600 MG 12 hr tablet [id number] No Take 1 tablet (600 mg total) by mouth 2 (two) times daily for 10 days.  Patient not taking: Reported on 06/14/2021   Roxan Hockey, MD Completed Course Active Self  nicotine (NICODERM CQ - DOSED IN MG/24 HOURS) 14 mg/24hr patch [id number] No Place 1 patch (14 mg total) onto the skin daily as needed (nicotine craving).  Patient not taking: Reported on 06/14/2021   Roxan Hockey, MD Not Taking Active Self  triamterene-hydrochlorothiazide (MAXZIDE-25) 37.5-25 MG tablet [id number] Yes Take 0.5 tablets by mouth every morning. Take Half a tablet every Morning starting 06/11/21 after you finish Lasix/Furosemide Roxan Hockey, MD 06/11/2021 Active Self  warfarin (COUMADIN) 10 MG tablet [id number] Yes TAKE (1) OR (1) 1/2 TABLET BY MOUTH DAILY AS DIRECTED.  Patient taking differently: Take 10-15 mg by mouth See admin instructions. Take one 10 mg tablet every day except on Tues and Thursdays take 15 mg by mouth (Patient sates she takes a 10 mg tablet and then splits it in half to make total of 15 mg) per patient   Arnoldo Lenis, MD 06/14/2021 0800 Active Self  Med Note (SATTERFIELD, Armstead Peaks   Thu Jun 14, 2021  9:30 PM)    Med List Note Brennan Bailey 06/05/21 0815):                SDOH:  (Social Determinants of Health) assessments and interventions performed:    Care Plan  Review of patient past medical history, allergies, medications, health status, including review of consultants reports, laboratory and other test data, was performed as part of comprehensive evaluation for care  management services.   There are no care plans that you recently modified to display for this patient.    Plan: [facility] RN CM scheduled this patient for another call attempt within 4-7 business days Unsuccessful outreach letter sent on 06/26/21 Unsuccessful outreach on 06/26/21, 06/27/21 07/02/21   Yoshua Geisinger L. Lavina Hamman, RN, BSN, [place] Coordinator Office number [phone]

## 2021-07-04 ENCOUNTER — Ambulatory Visit (INDEPENDENT_AMBULATORY_CARE_PROVIDER_SITE_OTHER): Payer: Medicare HMO | Admitting: *Deleted

## 2021-07-04 DIAGNOSIS — I2602 Saddle embolus of pulmonary artery with acute cor pulmonale: Secondary | ICD-10-CM

## 2021-07-04 DIAGNOSIS — Z86711 Personal history of pulmonary embolism: Secondary | ICD-10-CM | POA: Diagnosis not present

## 2021-07-04 DIAGNOSIS — I2782 Chronic pulmonary embolism: Secondary | ICD-10-CM

## 2021-07-04 DIAGNOSIS — Z5181 Encounter for therapeutic drug level monitoring: Secondary | ICD-10-CM

## 2021-07-04 LAB — POCT INR: INR: 4 — AB (ref 2.0–3.0)

## 2021-07-04 NOTE — Patient Instructions (Signed)
Hold warfarin tonight then decrease dose to 1 tablet daily Recheck in 2 wk

## 2021-07-16 ENCOUNTER — Other Ambulatory Visit: Payer: Self-pay | Admitting: *Deleted

## 2021-07-27 ENCOUNTER — Encounter: Payer: Self-pay | Admitting: *Deleted

## 2021-08-02 ENCOUNTER — Inpatient Hospital Stay: Payer: Self-pay | Admitting: Pulmonary Disease

## 2021-08-12 ENCOUNTER — Encounter (HOSPITAL_COMMUNITY): Payer: Self-pay | Admitting: Hematology

## 2021-08-14 ENCOUNTER — Ambulatory Visit (INDEPENDENT_AMBULATORY_CARE_PROVIDER_SITE_OTHER): Payer: Medicare HMO | Admitting: Cardiology

## 2021-08-14 ENCOUNTER — Encounter: Payer: Self-pay | Admitting: Cardiology

## 2021-08-14 VITALS — BP 140/90 | HR 104 | Ht 70.0 in | Wt 280.2 lb

## 2021-08-14 DIAGNOSIS — I5042 Chronic combined systolic (congestive) and diastolic (congestive) heart failure: Secondary | ICD-10-CM | POA: Diagnosis not present

## 2021-08-14 DIAGNOSIS — D509 Iron deficiency anemia, unspecified: Secondary | ICD-10-CM | POA: Diagnosis not present

## 2021-08-14 DIAGNOSIS — I10 Essential (primary) hypertension: Secondary | ICD-10-CM | POA: Diagnosis not present

## 2021-08-14 DIAGNOSIS — I272 Pulmonary hypertension, unspecified: Secondary | ICD-10-CM

## 2021-08-14 DIAGNOSIS — Z79899 Other long term (current) drug therapy: Secondary | ICD-10-CM | POA: Diagnosis not present

## 2021-08-14 MED ORDER — ATORVASTATIN CALCIUM 20 MG PO TABS: 20.0000 mg | ORAL_TABLET | Freq: Every day | ORAL | 6 refills | Status: DC

## 2021-08-14 NOTE — Patient Instructions (Addendum)
Medication Instructions:  Begin Atorvastatin '20mg'$  daily  Continue all other medications.     Labwork: BMET, MG, FLP, CBC - orders given today  Reminder:  Nothing to eat or drink after 12 midnight prior to labs.  Testing/Procedures: Your physician has requested that you have a limited echocardiogram with contrast.   Echocardiography is a painless test that uses sound waves to create images of your heart. It provides your doctor with information about the size and shape of your heart and how well your heart's chambers and valves are working. This procedure takes approximately one hour. There are no restrictions for this procedure.   Follow-Up: Office will contact with results via phone, letter or mychart.    2 months   Any Other Special Instructions Will Be Listed Below (If Applicable).   If you need a refill on your cardiac medications before your next appointment, please call your pharmacy.

## 2021-08-14 NOTE — Progress Notes (Signed)
Clinical Summary [name] [name] is a 46 y.o.female seen today for follow up of the following medical problems.    1. History of pulmonary embolism/Pulmonary HTN - history of unprovoked saddle PE in 04/2013. Followed by hematology, plans for lifelong anticoagulation. Has been on coumadin. From [facility] hematology notes did not recommend a DOAC, stating have not been well studied in patients over 250 lbs.  - 04/2013 LVEF 78-29%, grade I diastolic dysfunction. Flattening of interventricular septum with mild RV dysfunction, PASP 53.  -repeat CT did show near complete lysis of prior extensive PE.    05/2021 RHC: mean PA 30, PCWP 24,PVR 0.7 wood units - no bleeding on couadin    2. HTN - compilant with meds   3. Anemia - followed by hematology - on iron infusions.   4.Chronc combined systolic/diastolic HF -  Echocardiogram 06/05/21 showed EF mildly down to 45-50%, mild LVH (full report above)  - Underwent R/L heart cath on 06/19/21 that showed 40-50% proximal right coronary narrowing, otherwise normal coronary arteries. Also showed significant elevation in LVEDP, consistent with acute on chronic combince systolic and diastolic heart failure. 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  - occasional LE edema that is chronic. - taking lasix '80mg'$  daily. Hospitali discharge 274 lbs.  - needs repeat labs - soft bp's limited meds during admission, was not started on ACE/ARB/ARNI or SLGT2i    5. History of respiratory failure - admit 05/2021 requiring intubation in setting of pneumonia, HF  6. CAD - 05/2021 cath 50% prox RCA, LVEDP 30 - has not been on statin  - no chest pains Past Medical History:  Diagnosis Date   Abdominal cramps 08/30/2015   Anemia    Blood transfusion without reported diagnosis    Closed left ankle fracture    August 30 2012   Clotting disorder [facility])    DVT (deep venous thrombosis) (HCC)    L leg   Fibroid tumor     Hidradenitis    High blood pressure    Hypertension    Obesity    OSA on CPAP    Oxygen deficiency    Pregnancy    09/20/14 17 weeks   Pulmonary embolism ([place]) 04/2013   Sleep apnea    Tobacco use disorder      Allergies  Allergen Reactions   Sulfa Antibiotics Itching, Swelling and Rash    Lips swelling     Current Outpatient Medications  Medication Sig Dispense Refill   albuterol (VENTOLIN HFA) 108 (90 Base) MCG/ACT inhaler Inhale 2 puffs into the lungs every 2 (two) hours as needed for wheezing or shortness of breath. 18 g 0   enoxaparin (LOVENOX) 120 MG/0.8ML injection Inject 0.8 mLs (120 mg total) into the skin every 12 (twelve) hours for 7 days. 11.2 mL 0   ferrous sulfate 325 (65 FE) MG EC tablet Take 1 tablet (325 mg total) by mouth daily with breakfast. (Patient not taking: Reported on 06/14/2021) 90 tablet 3   furosemide (LASIX) 80 MG tablet Take 1 tablet (80 mg total) by mouth daily. 30 tablet 3   metoprolol succinate (TOPROL-XL) 25 MG 24 hr tablet Take 0.5 tablets (12.5 mg total) by mouth daily. 60 tablet 0   nicotine (NICODERM CQ - DOSED IN MG/24 HOURS) 14 mg/24hr patch Place 1 patch (14 mg total) onto the skin daily as needed (nicotine craving). (Patient not taking: Reported on 06/14/2021) 28 patch 0  spironolactone (ALDACTONE) 25 MG tablet Take 0.5 tablets (12.5 mg total) by mouth daily. 30 tablet 3   warfarin (COUMADIN) 10 MG tablet TAKE (1) OR (1) 1/2 TABLET BY MOUTH DAILY AS DIRECTED. 45 tablet 2   No current facility-administered medications for this visit.   Facility-Administered Medications Ordered in Other Visits  Medication Dose Route Frequency Provider Last Rate Last Admin   0.9 %  sodium chloride infusion   Intravenous Continuous Baird Cancer, PA-C 20 mL/hr at 12/22/15 1430 New Bag at 12/22/15 1430     Past Surgical History:  Procedure Laterality Date   ABSCESS DRAINAGE Bilateral 01/10/15   COLONOSCOPY WITH PROPOFOL N/A 06/08/2018   Procedure:  COLONOSCOPY WITH PROPOFOL;  Surgeon: Daneil Dolin, MD;  Location: [facility];  Service: Endoscopy;  Laterality: N/A;  2:30pm   CYST REMOVAL TRUNK     IVC FILTER PLACEMENT ([place] HX)     LOWER EXTREMITY VENOGRAPHY N/A 02/07/2021   Procedure: LOWER EXTREMITY VENOGRAPHY;  Surgeon: [name], MD;  Location: [place] CV LAB;  Service: Cardiovascular;  Laterality: N/A;   PERIPHERAL VASCULAR THROMBECTOMY N/A 02/07/2021   Procedure: PERIPHERAL VASCULAR THROMBECTOMY;  Surgeon: [name], MD;  Location: [place] CV LAB;  Service: Cardiovascular;  Laterality: N/A;   RIGHT/LEFT HEART CATH AND CORONARY ANGIOGRAPHY N/A 06/19/2021   Procedure: RIGHT/LEFT HEART CATH AND CORONARY ANGIOGRAPHY;  Surgeon: Belva Crome, MD;  Location: [place] CV LAB;  Service: Cardiovascular;  Laterality: N/A;     Allergies  Allergen Reactions   Sulfa Antibiotics Itching, Swelling and Rash    Lips swelling      Family History  Problem Relation Age of Onset   Hypertension Mother    Diabetes Paternal Aunt    Diabetes Paternal Uncle    Other Father        blood clots   Cancer Maternal Grandmother    Heart attack Paternal Grandmother    Colon cancer Neg Hx      Social History [name] [name] reports that she has been smoking cigarettes. She has a 4.50 pack-year smoking history. She has never used smokeless tobacco. [name] [name] reports current alcohol use of about 2.0 standard drinks of alcohol per week.   Review of Systems CONSTITUTIONAL: No weight loss, fever, chills, weakness or fatigue.  HEENT: Eyes: No visual loss, blurred vision, double vision or yellow sclerae.No hearing loss, sneezing, congestion, runny nose or sore throat.  SKIN: No rash or itching.  CARDIOVASCULAR: per hpi RESPIRATORY: No shortness of breath, cough or sputum.  GASTROINTESTINAL: No anorexia, nausea, vomiting or diarrhea. No abdominal pain or blood.  GENITOURINARY: No burning on urination, no polyuria NEUROLOGICAL: No  headache, dizziness, syncope, paralysis, ataxia, numbness or tingling in the extremities. No change in bowel or bladder control.  MUSCULOSKELETAL: No muscle, back pain, joint pain or stiffness.  LYMPHATICS: No enlarged nodes. No history of splenectomy.  PSYCHIATRIC: No history of depression or anxiety.  ENDOCRINOLOGIC: No reports of sweating, cold or heat intolerance. No polyuria or polydipsia.  Marland Kitchen   Physical Examination Today's Vitals   08/14/21 1455  BP: 140/90  Pulse: (!) 104  SpO2: 98%  Weight: 280 lb 3.2 oz (127.1 kg)  Height: '5\' 10"'$  (1.778 m)   Body mass index is 40.2 kg/m.  Gen: resting comfortably, no acute distress HEENT: no scleral icterus, pupils equal round and reactive, no palptable cervical adenopathy,  CV: RRR, 2/6 systolic murmur apex, no jvd Resp: Clear to auscultation bilaterally GI: abdomen  is soft, non-tender, non-distended, normal bowel sounds, no hepatosplenomegaly MSK: extremities are warm, no edema.  Skin: warm, no rash Neuro:  no focal deficits Psych: appropriate affect   Diagnostic Studies  04/2013 echo Study Conclusions  - Study data: Technically difficult study. - Procedure narrative: Transthoracic echocardiography. Image   quality was adequate. The study was technically difficult,   as a result of body habitus. - Left ventricle: The cavity size was normal. Wall thickness   was increased in a pattern of moderate LVH. Systolic   function was vigorous. The estimated ejection fraction was   in the range of 65% to 70%. Wall motion was normal; there   were no regional wall motion abnormalities. Doppler   parameters are consistent with abnormal left ventricular   relaxation (grade 1 diastolic dysfunction). - Aortic valve: Valve area: 2.01cm^2(VTI). Valve area:   2.11cm^2 (Vmax). - Right ventricle: There is systolic flattening of the   interventricular septum consistent with RV pressure   overload. The cavity size was severely dilated. Systolic    function was mildly reduced. - Pulmonary arteries: Systolic pressure was moderately   increased. PA peak pressure: 67m Hg (S).     08/2017 echo Study Conclusions   - Left ventricle: The cavity size was normal. Wall thickness was   increased in a pattern of mild LVH. Systolic function was normal.   The estimated ejection fraction was in the range of 60% to 65%.   Wall motion was normal; there were no regional wall motion   abnormalities. Left ventricular diastolic function parameters   were normal.   Echocardiogram 06/05/21  1. Left ventricular ejection fraction, by estimation, is 45 to 50%. The  left ventricle has mildly decreased function. The left ventricle  demonstrates global hypokinesis. The left ventricular internal cavity size  was moderately dilated. There is mild  left ventricular hypertrophy. Left ventricular diastolic parameters are  indeterminate.   2. Right ventricular systolic function is normal. The right ventricular  size is normal. There is severely elevated pulmonary artery systolic  pressure.   3. Left atrial size was severely dilated.   4. Right atrial size was mildly dilated.   5. The mitral valve is abnormal. Mild mitral valve regurgitation. No  evidence of mitral stenosis.   6. The tricuspid valve is abnormal.   7. The aortic valve is tricuspid. Aortic valve regurgitation is not  visualized. No aortic stenosis is present.   8. The inferior vena cava is normal in size with greater than 50%  respiratory variability, suggesting right atrial pressure of 3 mmHg.    05/2021 RHC/LHC CONCLUSIONS: 40 to 50% proximal right coronary narrowing. Right dominant anatomy Coronary arteries otherwise normal Significant elevation LVEDP 30 mmHg consistent with acute on chronic combined systolic and diastolic heart failure Mild pulmonary hypertension, mean pressure 30 mmHg.  WHO group II etiology based on hemodysnamics.. Capillary wedge mean 24 mmHg.  V wave to 40 mmHg  suggesting some degree of mitral regurgitation. Cardiac output 8.5 L/min with index 3.57 L/min/m. Pulmonary resistance 0.7 Wood units.   RECOMMENDATIONS: Care with IV fluid administration. Needs diuresis. Start heart failure therapy including ARNI and SGLT2 as tolerated.     Assessment and Plan  1. Chronic combined systolic/diastolic HF - euvolemic today, needs repeat bmet/mg on diuretic - repeat echo, LVEF 45-50% could be transient in setting of severe systemic stress at the time. If improved would not need to proceed with adding HF medications  2.Anemia - repeat cbc  3. Pulmonary  HTN - RHC during admission with pulm HTN secondary to left sided heart disease, volume overload - continue to manage fluid status with diuretic   F/u 2 months      Arnoldo Lenis, M.D.

## 2021-08-21 ENCOUNTER — Other Ambulatory Visit (HOSPITAL_COMMUNITY)
Admission: RE | Admit: 2021-08-21 | Discharge: 2021-08-21 | Disposition: A | Discharge status: Home / Self Care | Payer: Medicare HMO | Source: Ambulatory Visit | Attending: Cardiology | Admitting: Cardiology

## 2021-08-21 ENCOUNTER — Encounter (HOSPITAL_COMMUNITY): Payer: Self-pay | Admitting: Hematology

## 2021-08-21 DIAGNOSIS — D509 Iron deficiency anemia, unspecified: Secondary | ICD-10-CM | POA: Diagnosis not present

## 2021-08-21 DIAGNOSIS — I10 Essential (primary) hypertension: Secondary | ICD-10-CM | POA: Diagnosis not present

## 2021-08-21 DIAGNOSIS — Z79899 Other long term (current) drug therapy: Secondary | ICD-10-CM | POA: Insufficient documentation

## 2021-08-21 DIAGNOSIS — I5042 Chronic combined systolic (congestive) and diastolic (congestive) heart failure: Secondary | ICD-10-CM | POA: Diagnosis not present

## 2021-08-21 LAB — LIPID PANEL
Cholesterol: 86 mg/dL (ref 0–200)
HDL: 25 mg/dL — ABNORMAL LOW (ref >40)
LDL Cholesterol: 49 mg/dL (ref 0–99)
Total CHOL/HDL Ratio: 3.4 RATIO
Triglycerides: 59 mg/dL (ref <150)
VLDL: 12 mg/dL (ref 0–40)

## 2021-08-21 LAB — BASIC METABOLIC PANEL
Anion gap: 8 (ref 5–15)
BUN: 22 mg/dL — ABNORMAL HIGH (ref 6–20)
CO2: 26 mmol/L (ref 22–32)
Calcium: 8.9 mg/dL (ref 8.9–10.3)
Chloride: 105 mmol/L (ref 98–111)
Creatinine, Ser: 1 mg/dL (ref 0.44–1.00)
GFR, Estimated: >60 mL/min (ref >60)
Glucose, Bld: 94 mg/dL (ref 70–99)
Potassium: 3.9 mmol/L (ref 3.5–5.1)
Sodium: 139 mmol/L (ref 135–145)

## 2021-08-21 LAB — CBC
HCT: 30.2 % — ABNORMAL LOW (ref 36.0–46.0)
Hemoglobin: 8 g/dL — ABNORMAL LOW (ref 12.0–15.0)
MCH: 18.6 pg — ABNORMAL LOW (ref 26.0–34.0)
MCHC: 26.5 g/dL — ABNORMAL LOW (ref 30.0–36.0)
MCV: 70.2 fL — ABNORMAL LOW (ref 80.0–100.0)
Platelets: 231 10*3/uL (ref 150–400)
RBC: 4.3 MIL/uL (ref 3.87–5.11)
RDW: 21 % — ABNORMAL HIGH (ref 11.5–15.5)
WBC: 4.5 10*3/uL (ref 4.0–10.5)
nRBC: 0 % (ref 0.0–0.2)

## 2021-08-21 LAB — MAGNESIUM: Magnesium: 2 mg/dL (ref 1.7–2.4)

## 2021-08-23 ENCOUNTER — Ambulatory Visit (HOSPITAL_COMMUNITY)
Admission: RE | Admit: 2021-08-23 | Discharge: 2021-08-23 | Disposition: A | Discharge status: Home / Self Care | Payer: Medicare HMO | Source: Ambulatory Visit | Attending: Cardiology | Admitting: Cardiology

## 2021-08-23 ENCOUNTER — Other Ambulatory Visit: Payer: Self-pay | Admitting: Cardiology

## 2021-08-23 DIAGNOSIS — Z79899 Other long term (current) drug therapy: Secondary | ICD-10-CM

## 2021-08-23 DIAGNOSIS — I5042 Chronic combined systolic (congestive) and diastolic (congestive) heart failure: Secondary | ICD-10-CM

## 2021-08-23 LAB — ECHOCARDIOGRAM COMPLETE
Area-P 1/2: 4.49 cm2
S' Lateral: 6.3 cm

## 2021-08-23 MED ORDER — PERFLUTREN LIPID MICROSPHERE
1.0000 mL | INTRAVENOUS | Status: AC | PRN
  Administered 2021-08-23: 3 mL via INTRAVENOUS

## 2021-08-23 NOTE — Progress Notes (Signed)
*  PRELIMINARY RESULTS* Echocardiogram 2D Echocardiogram has been performed with Definity.  Samuel Germany 08/23/2021, 11:48 AM

## 2021-08-27 ENCOUNTER — Encounter: Payer: Self-pay | Admitting: *Deleted

## 2021-08-27 ENCOUNTER — Encounter (HOSPITAL_COMMUNITY): Payer: Self-pay | Admitting: Hematology

## 2021-08-27 NOTE — Telephone Encounter (Signed)
Patient returned call confirming she is able to make 08/11 appt.

## 2021-08-30 ENCOUNTER — Encounter (HOSPITAL_COMMUNITY): Payer: Self-pay | Admitting: Hematology

## 2021-08-31 ENCOUNTER — Ambulatory Visit: Payer: Medicare HMO | Admitting: Cardiology

## 2021-08-31 NOTE — Progress Notes (Deleted)
Clinical Summary [name] [name] is a 46 y.o.female  seen today for follow up of the following medical problems.    1. History of pulmonary embolism/Pulmonary HTN - history of unprovoked saddle PE in 04/2013. Followed by hematology, plans for lifelong anticoagulation. Has been on coumadin. From [facility] hematology notes did not recommend a DOAC, stating have not been well studied in patients over 250 lbs.  - 04/2013 LVEF 09-38%, grade I diastolic dysfunction. Flattening of interventricular septum with mild RV dysfunction, PASP 53.  -repeat CT did show near complete lysis of prior extensive PE.    05/2021 RHC: mean PA 30, PCWP 24,PVR 0.7 wood units - no bleeding on couadin     2. HTN - compilant with meds   3. Anemia - followed by hematology - on iron infusions.    4.Chronc combined systolic/diastolic HF -  Echocardiogram 06/05/21 showed EF mildly down to 45-50%, mild LVH (full report above)  - Underwent R/L heart cath on 06/19/21 that showed 40-50% proximal right coronary narrowing, otherwise normal coronary arteries. Also showed significant elevation in LVEDP, consistent with acute on chronic combince systolic and diastolic heart failure. 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  - occasional LE edema that is chronic. - taking lasix '80mg'$  daily. Hospitali discharge 274 lbs.  - needs repeat labs - soft bp's limited meds during admission, was not started on ACE/ARB/ARNI or SLGT2i   08/2021 echo: LVEF 30-35%, LVIDd 7.2, grade III dd, mild RV dysfunction, severe pulm HTN, mod to severe MR     *add TSH   5. History of respiratory failure - admit 05/2021 requiring intubation in setting of pneumonia, HF   6. CAD - 05/2021 cath 50% prox RCA, LVEDP 30 - has not been on statin  - no chest pains  7. Mitral regurgitation - 05/2021 LVEF 45-50%, mild MR - 8/202 LVEF 30-35%, mod to severe MR - appears to be functional MR   Past Medical  History:  Diagnosis Date   Abdominal cramps 08/30/2015   Anemia    Blood transfusion without reported diagnosis    Closed left ankle fracture    August 30 2012   Clotting disorder [facility])    DVT (deep venous thrombosis) (HCC)    L leg   Fibroid tumor    Hidradenitis    High blood pressure    Hypertension    Obesity    OSA on CPAP    Oxygen deficiency    Pregnancy    09/20/14 17 weeks   Pulmonary embolism ([place]) 04/2013   Sleep apnea    Tobacco use disorder      Allergies  Allergen Reactions   Sulfa Antibiotics Itching, Swelling and Rash    Lips swelling     Current Outpatient Medications  Medication Sig Dispense Refill   albuterol (VENTOLIN HFA) 108 (90 Base) MCG/ACT inhaler Inhale 2 puffs into the lungs every 2 (two) hours as needed for wheezing or shortness of breath. 18 g 0   atorvastatin (LIPITOR) 20 MG tablet Take 1 tablet (20 mg total) by mouth daily. 30 tablet 6   furosemide (LASIX) 80 MG tablet Take 1 tablet (80 mg total) by mouth daily. 30 tablet 3   metoprolol succinate (TOPROL-XL) 25 MG 24 hr tablet Take 0.5 tablets (12.5 mg total) by mouth daily. 60 tablet 0   nicotine (NICODERM CQ - DOSED IN MG/24 HOURS) 14 mg/24hr patch Place 1 patch (  14 mg total) onto the skin daily as needed (nicotine craving). (Patient not taking: Reported on 06/14/2021) 28 patch 0   spironolactone (ALDACTONE) 25 MG tablet Take 0.5 tablets (12.5 mg total) by mouth daily. 30 tablet 3   warfarin (COUMADIN) 10 MG tablet TAKE (1) OR (1) 1/2 TABLET BY MOUTH DAILY AS DIRECTED. 45 tablet 2   No current facility-administered medications for this visit.   Facility-Administered Medications Ordered in Other Visits  Medication Dose Route Frequency Provider Last Rate Last Admin   0.9 %  sodium chloride infusion   Intravenous Continuous Baird Cancer, PA-C 20 mL/hr at 12/22/15 1430 New Bag at 12/22/15 1430     Past Surgical History:  Procedure Laterality Date   ABSCESS DRAINAGE Bilateral 01/10/15    COLONOSCOPY WITH PROPOFOL N/A 06/08/2018   Procedure: COLONOSCOPY WITH PROPOFOL;  Surgeon: Daneil Dolin, MD;  Location: [facility];  Service: Endoscopy;  Laterality: N/A;  2:30pm   CYST REMOVAL TRUNK     IVC FILTER PLACEMENT ([place] HX)     LOWER EXTREMITY VENOGRAPHY N/A 02/07/2021   Procedure: LOWER EXTREMITY VENOGRAPHY;  Surgeon: [name], MD;  Location: [place] CV LAB;  Service: Cardiovascular;  Laterality: N/A;   PERIPHERAL VASCULAR THROMBECTOMY N/A 02/07/2021   Procedure: PERIPHERAL VASCULAR THROMBECTOMY;  Surgeon: [name], MD;  Location: [place] CV LAB;  Service: Cardiovascular;  Laterality: N/A;   RIGHT/LEFT HEART CATH AND CORONARY ANGIOGRAPHY N/A 06/19/2021   Procedure: RIGHT/LEFT HEART CATH AND CORONARY ANGIOGRAPHY;  Surgeon: Belva Crome, MD;  Location: [place] CV LAB;  Service: Cardiovascular;  Laterality: N/A;     Allergies  Allergen Reactions   Sulfa Antibiotics Itching, Swelling and Rash    Lips swelling      Family History  Problem Relation Age of Onset   Hypertension Mother    Diabetes Paternal Aunt    Diabetes Paternal Uncle    Other Father        blood clots   Cancer Maternal Grandmother    Heart attack Paternal Grandmother    Colon cancer Neg Hx      Social History [name] reports that she has been smoking cigarettes. She has a 4.50 pack-year smoking history. She has never used smokeless tobacco. [name] reports current alcohol use of about 2.0 standard drinks of alcohol per week.   Review of Systems CONSTITUTIONAL: No weight loss, fever, chills, weakness or fatigue.  HEENT: Eyes: No visual loss, blurred vision, double vision or yellow sclerae.No hearing loss, sneezing, congestion, runny nose or sore throat.  SKIN: No rash or itching.  CARDIOVASCULAR:  RESPIRATORY: No shortness of breath, cough or sputum.  GASTROINTESTINAL: No anorexia, nausea, vomiting or diarrhea. No abdominal pain or blood.  GENITOURINARY: No  burning on urination, no polyuria NEUROLOGICAL: No headache, dizziness, syncope, paralysis, ataxia, numbness or tingling in the extremities. No change in bowel or bladder control.  MUSCULOSKELETAL: No muscle, back pain, joint pain or stiffness.  LYMPHATICS: No enlarged nodes. No history of splenectomy.  PSYCHIATRIC: No history of depression or anxiety.  ENDOCRINOLOGIC: No reports of sweating, cold or heat intolerance. No polyuria or polydipsia.  Marland Kitchen   Physical Examination There were no vitals filed for this visit. There were no vitals filed for this visit.  Gen: resting comfortably, no acute distress HEENT: no scleral icterus, pupils equal round and reactive, no palptable cervical adenopathy,  CV Resp: Clear to auscultation bilaterally GI: abdomen is soft, non-tender, non-distended, normal bowel sounds, no  hepatosplenomegaly MSK: extremities are warm, no edema.  Skin: warm, no rash Neuro:  no focal deficits Psych: appropriate affect   Diagnostic Studies   04/2013 echo Study Conclusions  - Study data: Technically difficult study. - Procedure narrative: Transthoracic echocardiography. Image   quality was adequate. The study was technically difficult,   as a result of body habitus. - Left ventricle: The cavity size was normal. Wall thickness   was increased in a pattern of moderate LVH. Systolic   function was vigorous. The estimated ejection fraction was   in the range of 65% to 70%. Wall motion was normal; there   were no regional wall motion abnormalities. Doppler   parameters are consistent with abnormal left ventricular   relaxation (grade 1 diastolic dysfunction). - Aortic valve: Valve area: 2.01cm^2(VTI). Valve area:   2.11cm^2 (Vmax). - Right ventricle: There is systolic flattening of the   interventricular septum consistent with RV pressure   overload. The cavity size was severely dilated. Systolic   function was mildly reduced. - Pulmonary arteries: Systolic  pressure was moderately   increased. PA peak pressure: 37m Hg (S).     08/2017 echo Study Conclusions   - Left ventricle: The cavity size was normal. Wall thickness was   increased in a pattern of mild LVH. Systolic function was normal.   The estimated ejection fraction was in the range of 60% to 65%.   Wall motion was normal; there were no regional wall motion   abnormalities. Left ventricular diastolic function parameters   were normal.     Echocardiogram 06/05/21  1. Left ventricular ejection fraction, by estimation, is 45 to 50%. The  left ventricle has mildly decreased function. The left ventricle  demonstrates global hypokinesis. The left ventricular internal cavity size  was moderately dilated. There is mild  left ventricular hypertrophy. Left ventricular diastolic parameters are  indeterminate.   2. Right ventricular systolic function is normal. The right ventricular  size is normal. There is severely elevated pulmonary artery systolic  pressure.   3. Left atrial size was severely dilated.   4. Right atrial size was mildly dilated.   5. The mitral valve is abnormal. Mild mitral valve regurgitation. No  evidence of mitral stenosis.   6. The tricuspid valve is abnormal.   7. The aortic valve is tricuspid. Aortic valve regurgitation is not  visualized. No aortic stenosis is present.   8. The inferior vena cava is normal in size with greater than 50%  respiratory variability, suggesting right atrial pressure of 3 mmHg.      05/2021 RHC/LHC CONCLUSIONS: 40 to 50% proximal right coronary narrowing. Right dominant anatomy Coronary arteries otherwise normal Significant elevation LVEDP 30 mmHg consistent with acute on chronic combined systolic and diastolic heart failure Mild pulmonary hypertension, mean pressure 30 mmHg.  WHO group II etiology based on hemodysnamics.. Capillary wedge mean 24 mmHg.  V wave to 40 mmHg suggesting some degree of mitral regurgitation. Cardiac  output 8.5 L/min with index 3.57 L/min/m. Pulmonary resistance 0.7 Wood units.   RECOMMENDATIONS: Care with IV fluid administration. Needs diuresis. Start heart failure therapy including ARNI and SGLT2 as tolerated.       Assessment and Plan   1. Chronic combined systolic/diastolic HF - euvolemic today, needs repeat bmet/mg on diuretic - repeat echo, LVEF 45-50% could be transient in setting of severe systemic stress at the time. If improved would not need to proceed with adding HF medications   2.Anemia - repeat cbc  3. Pulmonary HTN - RHC during admission with pulm HTN secondary to left sided heart disease, volume overload - continue to manage fluid status with diuretic     Arnoldo Lenis, M.D., F.A.C.C.

## 2021-09-10 ENCOUNTER — Other Ambulatory Visit: Payer: Self-pay

## 2021-09-10 MED ORDER — ATORVASTATIN CALCIUM 20 MG PO TABS: 20.0000 mg | ORAL_TABLET | Freq: Every day | ORAL | 3 refills | Status: DC

## 2021-09-10 NOTE — Telephone Encounter (Signed)
Pt insurance requests 90 day refill for atorvastatin 20 mg-done

## 2021-09-11 ENCOUNTER — Other Ambulatory Visit: Payer: Self-pay | Admitting: *Deleted

## 2021-09-11 NOTE — Patient Outreach (Signed)
  Care Coordination   09/11/2021 Name: [name] [name] MRN: [id number] DOB: February 15, 1975   Care Coordination Outreach Attempts:  An unsuccessful telephone outreach was attempted today to offer the patient information about available care coordination services as a benefit of their health plan.   Follow Up Plan:  Additional outreach attempts will be made to offer the patient care coordination information and services.   Encounter Outcome:  No Answer  Care Coordination Interventions Activated:  No   Care Coordination Interventions:  No, not indicated    Valente David, RN, MSN, [facility] Care Coordinator [phone]

## 2021-09-13 ENCOUNTER — Ambulatory Visit (HOSPITAL_COMMUNITY)
Admission: RE | Admit: 2021-09-13 | Discharge: 2021-09-13 | Disposition: A | Discharge status: Home / Self Care | Payer: Medicare HMO | Source: Ambulatory Visit | Attending: Pulmonary Disease | Admitting: Pulmonary Disease

## 2021-09-13 ENCOUNTER — Ambulatory Visit (INDEPENDENT_AMBULATORY_CARE_PROVIDER_SITE_OTHER): Payer: Medicare HMO | Admitting: Pulmonary Disease

## 2021-09-13 ENCOUNTER — Encounter: Payer: Self-pay | Admitting: Pulmonary Disease

## 2021-09-13 VITALS — BP 132/76 | HR 68 | Temp 97.7°F | Ht 71.0 in | Wt 272.8 lb

## 2021-09-13 DIAGNOSIS — I5023 Acute on chronic systolic (congestive) heart failure: Secondary | ICD-10-CM

## 2021-09-13 DIAGNOSIS — R079 Chest pain, unspecified: Secondary | ICD-10-CM | POA: Diagnosis not present

## 2021-09-13 DIAGNOSIS — Z9989 Dependence on other enabling machines and devices: Secondary | ICD-10-CM

## 2021-09-13 DIAGNOSIS — I10 Essential (primary) hypertension: Secondary | ICD-10-CM | POA: Diagnosis not present

## 2021-09-13 DIAGNOSIS — R0602 Shortness of breath: Secondary | ICD-10-CM | POA: Diagnosis not present

## 2021-09-13 DIAGNOSIS — R0609 Other forms of dyspnea: Secondary | ICD-10-CM | POA: Diagnosis not present

## 2021-09-13 DIAGNOSIS — J810 Acute pulmonary edema: Secondary | ICD-10-CM | POA: Diagnosis not present

## 2021-09-13 DIAGNOSIS — G4733 Obstructive sleep apnea (adult) (pediatric): Secondary | ICD-10-CM | POA: Diagnosis not present

## 2021-09-13 DIAGNOSIS — R490 Dysphonia: Secondary | ICD-10-CM | POA: Diagnosis not present

## 2021-09-13 MED ORDER — ALBUTEROL SULFATE HFA 108 (90 BASE) MCG/ACT IN AERS: 2.0000 | INHALATION_SPRAY | RESPIRATORY_TRACT | 11 refills | Status: DC | PRN

## 2021-09-13 NOTE — Progress Notes (Signed)
   Subjective:    Patient ID: [name] [name], female    DOB: 04-24-1975, 46 y.o.   MRN: [id number]  HPI  46 yo smoker, for FU of OSA -on CPAP 11 cm  She was being followed by my partner Dr. Lamonte Sakai in the [facility] office and presents after recent hospital visit to establish care at the [facility] location  [place] - HTN, obesity, - unprovoked saddle PE 2015, seen by hematology, rec warfarin due to weight more than 250 pounds - Recurrent DVT - 2015, 01/2021 s/p IVC fiter, S/p  clot removed surgically by vascular from L LE 01/2021. She remains on warfarin  -anemia secondary to leiomyoma/menorrhagia   Chief Complaint  Patient presents with   Hospitalization Follow-up    Was seeing Dr. Lamonte Sakai for OSA. CPAP working well [place] admission 5/23-6/2. Feels better since admission, still has SOB. Needs refill on inhaler    Adm 5/16-5/18 for CAP presented to [facility] on 06/11/21 with progressive dyspnea. She was noted to have respiratory failure and was intubated in the ER bilateral airspace opacities on chest imaging concerning for pulmonary edema, viral pneumonia or aspiration pneumonitis  Cardiology consulted underwent heart cath 5/31, which showed 20 to 30% proximal circumflex, coronary otherwise normal.  Elevated end-diastolic pressure of 31 consistent with acute on chronic combined systolic and diastolic heart failure.  Ejection fraction down to  45 to 50%.  Pulmonary hypertension 30 mmHg Reviewed subsequent cardiology office visit, repeat echo showed EF down to 30 to 35%.  She has not made follow-up appointment.  She complains of throat being raspy since her hospital visit. She has resumed smoking about 2 cigarettes daily.  She uses CPAP with nasal pillows, denies any problems with mask or pressure.  05/2021 chest x-ray shows edema pattern  Significant tests/ events reviewed 05/2021 RHC: mean PA 30, PCWP 24,PVR 0.7 wood units LHC  50% prox RCA, LVEDP 30  Echo 08/2021 EF down to 30 to  35%, moderate to severe MR  07/2013 -splits 30 wt 360 pounds -AHI 37/hour, lowest desat 82%, corrected by CPAP 9 cm  11/2015 split study-343 pounds -AHI 77/hour, CPAP 11 cm  Review of Systems neg for any significant sore throat, dysphagia, itching, sneezing, nasal congestion or excess/ purulent secretions, fever, chills, sweats, unintended wt loss, pleuritic or exertional cp, hempoptysis, orthopnea pnd or change in chronic leg swelling. Also denies presyncope, palpitations, heartburn, abdominal pain, nausea, vomiting, diarrhea or change in bowel or urinary habits, dysuria,hematuria, rash, arthralgias, visual complaints, headache, numbness weakness or ataxia.     Objective:   Physical Exam   Gen. Pleasant, obese, in no distress, normal affect ENT - no pallor,icterus, no post nasal drip, class 2-3 airway Neck: No JVD, no thyromegaly, no carotid bruits Lungs: no use of accessory muscles, no dullness to percussion, decreased without rales or rhonchi  Cardiovascular: Rhythm regular, heart sounds  normal, no murmurs or gallops, no peripheral edema Abdomen: soft and non-tender, no hepatosplenomegaly, BS normal. Musculoskeletal: No deformities, no cyanosis or clubbing Neuro:  alert, non focal, no tremors        Assessment & Plan:

## 2021-09-13 NOTE — Patient Instructions (Addendum)
  Please use your machine every night  X CPAP supplies to [facility] referral for raspy vice post intubation 05/2021  X CXR   Albuterol refill  Get in touch with dr branch office for appt

## 2021-09-13 NOTE — Assessment & Plan Note (Signed)
Repeat chest x-ray, her fluid status seems to be controlled on diuretics

## 2021-09-13 NOTE — Assessment & Plan Note (Signed)
EF is down to 30 to 35%. I have asked her to get in touch with cardiology She will likely need to start on ACE/ARB or Entresto

## 2021-09-13 NOTE — Assessment & Plan Note (Signed)
CPAP download was reviewed which shows excellent control of events on 11 cm with mild leak and poor compliance. I encouraged her to be more consistent with her CPAP. Cardiovascular benefits were discussed especially in the setting of chronic systolic heart failure

## 2021-09-14 ENCOUNTER — Other Ambulatory Visit: Payer: Self-pay | Admitting: *Deleted

## 2021-09-14 NOTE — Patient Outreach (Signed)
  Care Coordination   09/14/2021 Name: MAANVI LECOMPTE MRN: [id number] DOB: [birth year]   Care Coordination Outreach Attempts:  A second unsuccessful outreach was attempted today to offer the patient with information about available care coordination services as a benefit of their health plan.     Follow Up Plan:  Additional outreach attempts will be made to offer the patient care coordination information and services.   Encounter Outcome:  No Answer  Care Coordination Interventions Activated:  No   Care Coordination Interventions:  No, not indicated    Valente David, RN, MSN, [facility] Care Management Care Management Coordinator [phone]

## 2021-09-19 ENCOUNTER — Other Ambulatory Visit: Payer: Self-pay | Admitting: *Deleted

## 2021-09-19 NOTE — Patient Outreach (Signed)
  Care Coordination   09/19/2021 Name: [name] [name] MRN: [id number] DOB: 15-Dec-1975   Care Coordination Outreach Attempts:  A third unsuccessful outreach was attempted today to offer the patient with information about available care coordination services as a benefit of their health plan.   Follow Up Plan:  No further outreach attempts will be made at this time. We have been unable to contact the patient to offer or enroll patient in care coordination services  Encounter Outcome:  No Answer  Care Coordination Interventions Activated:  No   Care Coordination Interventions:  No, not indicated    Valente David, RN, MSN, [facility] Care Management Care Management Coordinator [phone]

## 2021-09-22 ENCOUNTER — Emergency Department (HOSPITAL_COMMUNITY): Payer: Medicare HMO

## 2021-09-22 ENCOUNTER — Inpatient Hospital Stay (HOSPITAL_COMMUNITY)
Admission: EM | Admit: 2021-09-22 | Discharge: 2021-09-25 | DRG: 552 | Disposition: A | Discharge status: Home / Self Care | Payer: Medicare HMO | Attending: Family Medicine | Admitting: Family Medicine

## 2021-09-22 ENCOUNTER — Encounter (HOSPITAL_COMMUNITY): Payer: Self-pay

## 2021-09-22 ENCOUNTER — Other Ambulatory Visit: Payer: Self-pay

## 2021-09-22 DIAGNOSIS — S0083XA Contusion of other part of head, initial encounter: Secondary | ICD-10-CM | POA: Diagnosis present

## 2021-09-22 DIAGNOSIS — I5022 Chronic systolic (congestive) heart failure: Secondary | ICD-10-CM | POA: Diagnosis present

## 2021-09-22 DIAGNOSIS — I2721 Secondary pulmonary arterial hypertension: Secondary | ICD-10-CM | POA: Diagnosis not present

## 2021-09-22 DIAGNOSIS — F17210 Nicotine dependence, cigarettes, uncomplicated: Secondary | ICD-10-CM | POA: Diagnosis present

## 2021-09-22 DIAGNOSIS — Z86718 Personal history of other venous thrombosis and embolism: Secondary | ICD-10-CM

## 2021-09-22 DIAGNOSIS — Z8249 Family history of ischemic heart disease and other diseases of the circulatory system: Secondary | ICD-10-CM

## 2021-09-22 DIAGNOSIS — S12191A Other nondisplaced fracture of second cervical vertebra, initial encounter for closed fracture: Secondary | ICD-10-CM | POA: Diagnosis not present

## 2021-09-22 DIAGNOSIS — I081 Rheumatic disorders of both mitral and tricuspid valves: Secondary | ICD-10-CM | POA: Diagnosis not present

## 2021-09-22 DIAGNOSIS — Z79899 Other long term (current) drug therapy: Secondary | ICD-10-CM | POA: Diagnosis not present

## 2021-09-22 DIAGNOSIS — S12101A Unspecified nondisplaced fracture of second cervical vertebra, initial encounter for closed fracture: Secondary | ICD-10-CM

## 2021-09-22 DIAGNOSIS — Z7901 Long term (current) use of anticoagulants: Secondary | ICD-10-CM

## 2021-09-22 DIAGNOSIS — S12100A Unspecified displaced fracture of second cervical vertebra, initial encounter for closed fracture: Secondary | ICD-10-CM | POA: Diagnosis not present

## 2021-09-22 DIAGNOSIS — D62 Acute posthemorrhagic anemia: Secondary | ICD-10-CM | POA: Diagnosis present

## 2021-09-22 DIAGNOSIS — S129XXA Fracture of neck, unspecified, initial encounter: Secondary | ICD-10-CM | POA: Diagnosis present

## 2021-09-22 DIAGNOSIS — I2722 Pulmonary hypertension due to left heart disease: Secondary | ICD-10-CM | POA: Diagnosis present

## 2021-09-22 DIAGNOSIS — E871 Hypo-osmolality and hyponatremia: Secondary | ICD-10-CM | POA: Diagnosis present

## 2021-09-22 DIAGNOSIS — D259 Leiomyoma of uterus, unspecified: Secondary | ICD-10-CM | POA: Diagnosis present

## 2021-09-22 DIAGNOSIS — E669 Obesity, unspecified: Secondary | ICD-10-CM | POA: Diagnosis present

## 2021-09-22 DIAGNOSIS — Z882 Allergy status to sulfonamides status: Secondary | ICD-10-CM | POA: Diagnosis not present

## 2021-09-22 DIAGNOSIS — Z86711 Personal history of pulmonary embolism: Secondary | ICD-10-CM | POA: Diagnosis not present

## 2021-09-22 DIAGNOSIS — I110 Hypertensive heart disease with heart failure: Secondary | ICD-10-CM | POA: Diagnosis not present

## 2021-09-22 DIAGNOSIS — N920 Excessive and frequent menstruation with regular cycle: Secondary | ICD-10-CM | POA: Diagnosis present

## 2021-09-22 DIAGNOSIS — G4733 Obstructive sleep apnea (adult) (pediatric): Secondary | ICD-10-CM | POA: Diagnosis present

## 2021-09-22 DIAGNOSIS — I2602 Saddle embolus of pulmonary artery with acute cor pulmonale: Secondary | ICD-10-CM

## 2021-09-22 DIAGNOSIS — R55 Syncope and collapse: Secondary | ICD-10-CM | POA: Diagnosis not present

## 2021-09-22 DIAGNOSIS — D649 Anemia, unspecified: Secondary | ICD-10-CM

## 2021-09-22 DIAGNOSIS — W1789XA Other fall from one level to another, initial encounter: Secondary | ICD-10-CM | POA: Diagnosis not present

## 2021-09-22 LAB — COMPREHENSIVE METABOLIC PANEL
ALT: 62 U/L — ABNORMAL HIGH (ref 0–44)
AST: 59 U/L — ABNORMAL HIGH (ref 15–41)
Albumin: 3.3 g/dL — ABNORMAL LOW (ref 3.5–5.0)
Alkaline Phosphatase: 47 U/L (ref 38–126)
Anion gap: 9 (ref 5–15)
BUN: 19 mg/dL (ref 6–20)
CO2: 24 mmol/L (ref 22–32)
Calcium: 8.7 mg/dL — ABNORMAL LOW (ref 8.9–10.3)
Chloride: 98 mmol/L (ref 98–111)
Creatinine, Ser: 1.13 mg/dL — ABNORMAL HIGH (ref 0.44–1.00)
GFR, Estimated: >60 mL/min (ref >60)
Glucose, Bld: 91 mg/dL (ref 70–99)
Potassium: 3.5 mmol/L (ref 3.5–5.1)
Sodium: 131 mmol/L — ABNORMAL LOW (ref 135–145)
Total Bilirubin: 1.1 mg/dL (ref 0.3–1.2)
Total Protein: 7.1 g/dL (ref 6.5–8.1)

## 2021-09-22 LAB — URINALYSIS, ROUTINE W REFLEX MICROSCOPIC
Bacteria, UA: NONE SEEN
Bilirubin Urine: NEGATIVE
Glucose, UA: NEGATIVE mg/dL
Hgb urine dipstick: NEGATIVE
Ketones, ur: NEGATIVE mg/dL
Nitrite: NEGATIVE
Protein, ur: NEGATIVE mg/dL
Specific Gravity, Urine: 1.008 (ref 1.005–1.030)
pH: 6 (ref 5.0–8.0)

## 2021-09-22 LAB — BRAIN NATRIURETIC PEPTIDE: B Natriuretic Peptide: 1362 pg/mL — ABNORMAL HIGH (ref 0.0–100.0)

## 2021-09-22 LAB — CBC WITH DIFFERENTIAL/PLATELET
Abs Immature Granulocytes: 0.01 10*3/uL (ref 0.00–0.07)
Basophils Absolute: 0.1 10*3/uL (ref 0.0–0.1)
Basophils Relative: 1 %
Eosinophils Absolute: 0.1 10*3/uL (ref 0.0–0.5)
Eosinophils Relative: 2 %
HCT: 28.5 % — ABNORMAL LOW (ref 36.0–46.0)
Hemoglobin: 7.6 g/dL — ABNORMAL LOW (ref 12.0–15.0)
Immature Granulocytes: 0 %
Lymphocytes Relative: 25 %
Lymphs Abs: 1.5 10*3/uL (ref 0.7–4.0)
MCH: 17.6 pg — ABNORMAL LOW (ref 26.0–34.0)
MCHC: 26.7 g/dL — ABNORMAL LOW (ref 30.0–36.0)
MCV: 66.1 fL — ABNORMAL LOW (ref 80.0–100.0)
Monocytes Absolute: 0.6 10*3/uL (ref 0.1–1.0)
Monocytes Relative: 10 %
Neutro Abs: 3.6 10*3/uL (ref 1.7–7.7)
Neutrophils Relative %: 62 %
Platelets: 325 10*3/uL (ref 150–400)
RBC: 4.31 MIL/uL (ref 3.87–5.11)
RDW: 21 % — ABNORMAL HIGH (ref 11.5–15.5)
WBC: 5.8 10*3/uL (ref 4.0–10.5)
nRBC: 0.9 % — ABNORMAL HIGH (ref 0.0–0.2)

## 2021-09-22 LAB — PROTIME-INR
INR: 4.7 (ref 0.8–1.2)
Prothrombin Time: 44 seconds — ABNORMAL HIGH (ref 11.4–15.2)

## 2021-09-22 LAB — TROPONIN I (HIGH SENSITIVITY): Troponin I (High Sensitivity): 40 ng/L — ABNORMAL HIGH (ref <18)

## 2021-09-22 LAB — PREPARE RBC (CROSSMATCH)

## 2021-09-22 MED ORDER — SODIUM CHLORIDE 0.9% FLUSH
3.0000 mL | INTRAVENOUS | Status: DC | PRN
  Administered 2021-09-23: 3 mL via INTRAVENOUS

## 2021-09-22 MED ORDER — SODIUM CHLORIDE 0.9% FLUSH
3.0000 mL | Freq: Two times a day (BID) | INTRAVENOUS | Status: DC
  Administered 2021-09-22 – 2021-09-25 (×4): 3 mL via INTRAVENOUS

## 2021-09-22 MED ORDER — FUROSEMIDE 40 MG PO TABS
80.0000 mg | ORAL_TABLET | Freq: Every day | ORAL | Status: DC
Start: 2021-09-23 — End: 2021-09-22

## 2021-09-22 MED ORDER — SODIUM CHLORIDE 0.9 % IV SOLN: INTRAVENOUS | Status: DC | PRN

## 2021-09-22 MED ORDER — FUROSEMIDE 10 MG/ML IJ SOLN
40.0000 mg | Freq: Two times a day (BID) | INTRAMUSCULAR | Status: DC
  Administered 2021-09-23 – 2021-09-25 (×5): 40 mg via INTRAVENOUS
  Filled 2021-09-22 (×5): qty 4

## 2021-09-22 MED ORDER — SODIUM CHLORIDE 0.9 % IV SOLN
10.0000 mL/h | Freq: Once | INTRAVENOUS | Status: AC
  Administered 2021-09-22: 10 mL/h via INTRAVENOUS

## 2021-09-22 MED ORDER — METOPROLOL SUCCINATE ER 25 MG PO TB24
12.5000 mg | ORAL_TABLET | Freq: Every day | ORAL | Status: DC
Start: 2021-09-23 — End: 2021-09-24
  Administered 2021-09-23 – 2021-09-24 (×2): 12.5 mg via ORAL
  Filled 2021-09-22 (×2): qty 1

## 2021-09-22 MED ORDER — ONDANSETRON HCL 4 MG PO TABS
4.0000 mg | ORAL_TABLET | Freq: Four times a day (QID) | ORAL | Status: DC | PRN
  Administered 2021-09-23: 4 mg via ORAL
  Filled 2021-09-22: qty 1

## 2021-09-22 MED ORDER — WARFARIN - PHARMACIST DOSING INPATIENT
Freq: Every day | Status: DC
Start: 2021-09-23 — End: 2021-09-25
  Filled 2021-09-22: qty 1

## 2021-09-22 MED ORDER — ONDANSETRON HCL 4 MG/2ML IJ SOLN: 4.0000 mg | Freq: Four times a day (QID) | INTRAMUSCULAR | Status: DC | PRN

## 2021-09-22 MED ORDER — ATORVASTATIN CALCIUM 10 MG PO TABS
20.0000 mg | ORAL_TABLET | Freq: Every day | ORAL | Status: DC
  Administered 2021-09-23 – 2021-09-25 (×3): 20 mg via ORAL
  Filled 2021-09-22 (×3): qty 2

## 2021-09-22 MED ORDER — TRAZODONE HCL 50 MG PO TABS
50.0000 mg | ORAL_TABLET | Freq: Every evening | ORAL | Status: DC | PRN
  Administered 2021-09-24: 50 mg via ORAL
  Filled 2021-09-22: qty 1

## 2021-09-22 MED ORDER — POLYETHYLENE GLYCOL 3350 17 G PO PACK: 17.0000 g | PACK | Freq: Every day | ORAL | Status: DC | PRN

## 2021-09-22 MED ORDER — SPIRONOLACTONE 12.5 MG HALF TABLET
12.5000 mg | ORAL_TABLET | Freq: Every day | ORAL | Status: DC
  Administered 2021-09-23 – 2021-09-25 (×3): 12.5 mg via ORAL
  Filled 2021-09-22 (×3): qty 1

## 2021-09-22 MED ORDER — BISACODYL 10 MG RE SUPP: 10.0000 mg | Freq: Every day | RECTAL | Status: DC | PRN

## 2021-09-22 MED ORDER — ACETAMINOPHEN 325 MG PO TABS
650.0000 mg | ORAL_TABLET | Freq: Four times a day (QID) | ORAL | Status: DC | PRN
  Administered 2021-09-23 – 2021-09-25 (×2): 650 mg via ORAL
  Filled 2021-09-22 (×2): qty 2

## 2021-09-22 MED ORDER — SODIUM CHLORIDE 0.9% FLUSH
3.0000 mL | Freq: Two times a day (BID) | INTRAVENOUS | Status: DC
  Administered 2021-09-22 – 2021-09-25 (×6): 3 mL via INTRAVENOUS

## 2021-09-22 MED ORDER — POTASSIUM CHLORIDE CRYS ER 20 MEQ PO TBCR
40.0000 meq | EXTENDED_RELEASE_TABLET | Freq: Once | ORAL | Status: AC
Start: 2021-09-22 — End: 2021-09-22
  Administered 2021-09-22: 40 meq via ORAL
  Filled 2021-09-22: qty 2

## 2021-09-22 MED ORDER — ACETAMINOPHEN 650 MG RE SUPP: 650.0000 mg | Freq: Four times a day (QID) | RECTAL | Status: DC | PRN

## 2021-09-22 NOTE — ED Notes (Signed)
Pt requesting ice. Message sent to hospitalist, waiting response

## 2021-09-22 NOTE — H&P (Signed)
Patient Demographics:    [name] [name], is a 46 y.o. female  MRN: [id number]   DOB - 20-Aug-1975  Admit Date - 09/22/2021  Outpatient Primary MD for the patient is Carrolyn Meiers, MD   Assessment & Plan:   Assessment and Plan:  1)Syncope- admit to telemetry monitored unit, watch for arrhythmias, check serial troponins and EKG for rule out ACS protocol,  --CT head without acute intracranial abnormality -Initial troponin is 40, repeat troponin pending, EKG sinus rhythm with LVH and intraventricular conduction delay -Echocardiogram from 08/23/2021 showed EF dropped to 30 to 35% from 45-50 % back on Jun 05, 2021 -This echo also showed moderate to severe mitral regurg that was directed posteriorly, -No aortic stenosis was noted -Grade 3 diastolic dysfunction was noted (restrictive pattern) -This echo also showed severe dilatation of the left ventricular internal cavity with LVIDd..  With worsening pulmonary artery pressures (Compared to prior TTE on 05/2021, the EF has dropped to  30-35% from 45-50%, the LV is now severely enlarged, there is now at least  moderate MR (previously mild) and the filling pressures are significantly  elevated. ) -Given recent echo findings with drop in EF worsening pulmonary artery hypertension, worsening restrictive/diastolic dysfunction, worsening mitral regurgitation--- and now syncope with neck fracture --- Please get cardiology consult prior to discharge.Marland KitchenMarland KitchenMarland KitchenPatient is at risk for arrhythmias given low EF and recent echo findings with significant dilatation -Potassium is borderline at 3.5 replace -Check serum magnesium  2) acute on chronic anemia--symptomatic with dyspnea on exertion and recent syncope -Hemoglobin is down to 7 (baseline usually above 8, MCV and MCH are low), WBC  5.8 -Platelets 325 -INR is 4.7 -Recheck CBC in a.m. after transfusion of PRBC -At baseline patient has h/o anemia secondary to leiomyoma/menorrhagia requiring transfusions from time to time   3)HFrEF--- patient with acute on chronic combined diastolic and systolic dysfunction CHF presented with syncope and some dyspnea on exertion -Please see recent echo findings as above #1 -BNP elevated at 1362 -Please get cardiology consult -PTA was on Lasix 80 mg daily -Change IV Lasix 40 mg twice daily  4) C spine Fx- -injury was on 09/21/2021.....  Today 09/22/2021 patient noticed that she was having increasing dizziness headaches and neck pain so she came to the ED -CT of the C-spine shows-----Mildly displaced anterior corner type fracture of the body of C2., No other fracture of the cervical spine. -As per EDP Dr. Sabra Heck--  Dr. Trenton Gammon  (Neurosurgery), nurse practitioner called back and recommended cervical collar and follow-up in 2 weeks with Dr. Trenton Gammon as outpatient  5)Hyponatremia--sodium is 131 suspect due to diuretic use -Monitor  6) pulmonary hypertension--patient with history of obesity/OSA -H/o  pulmonary hypertension (30 mmHg. ), OSA on CPAP, RHC on 06/19/21 LVEDP 30, mean PA 30, PCWP 24,PVR 0.7 wood units -Echo from 08/23/2021 suggest worsening pulmonary hypertension  7) obesity/OSA/pulmonary hypertension--- continue CPAP nightly, see #6 above  8)H/o recurrent VTE---history of Recurrent DVT -  2015, 01/2021 s/p IVC fiter, S/p  clot removed surgically by vascular from L LE 01/2021, h/o unprovoked saddle PE 2015, seen by hematology, rec lifelong anticoagulation with warfarin due to weight more than 250 pounds, - She remains on warfarin ,  -INR 4.7 -Pharmacy Coumadin consult  Disposition/Need for in-Hospital Stay- patient unable to be discharged at this time due to -patient will need cardiology evaluation which is not available at [facility] over the weekend she will transfer to [facility] -Patient will need Zio patch/heart monitor upon discharge -Patient will need outpatient follow-up with neurosurgery Dr. Trenton Gammon in a couple weeks postdischarge  Status is: Inpatient  Remains inpatient appropriate because:   Dispo: The patient is from: Home              Anticipated d/c is to: Home              Anticipated d/c date is: 2 days              Patient currently is not medically stable to d/c. Barriers: Not Clinically Stable-    With History of - Reviewed by me  Past Medical History:  Diagnosis Date   Abdominal cramps 08/30/2015   Anemia    Blood transfusion without reported diagnosis    Closed left ankle fracture    August 30 2012   Clotting disorder [facility])    DVT (deep venous thrombosis) (HCC)    L leg   Fibroid tumor    Hidradenitis    High blood pressure    Hypertension    Obesity    OSA on CPAP    Oxygen deficiency    Pregnancy    09/20/14 17 weeks   Pulmonary embolism ([place]) 04/2013   Sleep apnea    Tobacco use disorder       Past Surgical History:  Procedure Laterality Date   ABSCESS DRAINAGE Bilateral 01/10/15   COLONOSCOPY WITH PROPOFOL N/A 06/08/2018   Procedure: COLONOSCOPY WITH PROPOFOL;  Surgeon: Daneil Dolin, MD;  [facility];  Service: Endoscopy;  Laterality: N/A;  2:30pm   CYST REMOVAL TRUNK     IVC FILTER PLACEMENT ([place] HX)     LOWER EXTREMITY VENOGRAPHY N/A 02/07/2021   Procedure: LOWER EXTREMITY VENOGRAPHY;  Surgeon: [name], MD;  Location: [place] [facility];  Service: Cardiovascular;  Laterality: N/A;   PERIPHERAL VASCULAR THROMBECTOMY N/A 02/07/2021   Procedure: PERIPHERAL VASCULAR THROMBECTOMY;  Surgeon: [name], MD;  Location: [place] [facility];  Service: Cardiovascular;  Laterality: N/A;   RIGHT/LEFT HEART CATH AND CORONARY ANGIOGRAPHY N/A 06/19/2021   Procedure: RIGHT/LEFT HEART CATH AND CORONARY ANGIOGRAPHY;  Surgeon: Belva Crome, MD;  Location: [place] [facility];  Service: Cardiovascular;   Laterality: N/A;    Chief Complaint  Patient presents with   Dizziness      HPI:    [name] [name]  is a 46 y.o. female with past medical history relevant for hypertension, obesity, history of Recurrent DVT - 2015, 01/2021 s/p IVC fiter, S/p  clot removed surgically by vascular from L LE 01/2021, h/o unprovoked saddle PE 2015, seen by hematology, rec lifelong anticoagulation with warfarin due to weight more than 250 pounds, - She remains on warfarin , h/o anemia secondary to leiomyoma/menorrhagia requiring transfusions from time to time H/o  pulmonary hypertension (30 mmHg. ), OSA on CPAP, LHC/RHC on 06/19/21  50% prox RCA, LVEDP 30, mean PA 30, PCWP 24,PVR 0.7 wood units  Echo  08/23/2021 EF down to 30 to 35%, moderate to severe MR.. -Presents to the ED on 09/22/2021 with dizziness headaches neck pain after episode of syncope on 09/21/2021 - Apparently she felt dizzy while trying to get into the truck with her aunt--- she proceeded to sit in the truck bed and passed out landing out of the truck to the ground face down with the neck hyperextended--- .Marland KitchenMarland Kitchen Patient apparently had complete loss of consciousness when this happened .... this was on 09/21/2021.....  Today 09/22/2021 patient noticed that she was having increasing dizziness headaches and neck pain so she came to the ED - Denies chest pains palpitations increased shortness of breath leg pains or increased leg swelling or pleuritic type symptoms -She does have some dyspnea on exertion No fever  Or chills  -In the ED CT of the C-spine shows-----Mildly displaced anterior corner type fracture of the body of C2., No other fracture of the cervical spine. -CT head without acute intracranial abnormality -Initial troponin is 40, repeat troponin pending, EKG sinus rhythm with LVH and intraventricular conduction delay -BNP elevated at 1362 -Sodium is 131,, potassium is 3.5, BUN 19, creatinine 1.1, AST 59 ALT 62, T. bili 1.1 alk phos 47 -Hemoglobin is down to  7 (baseline usually above 8, MCV and MCH are low), WBC 5.8 -Platelets 325 -INR is 4.7      Review of systems:    In addition to the HPI above,   A full Review of  Systems was done, all other systems reviewed are negative except as noted above in HPI , .    Social History:  Reviewed by me    Social History   Tobacco Use   Smoking status: Some Days    Packs/day: 0.25    Years: 18.00    Total pack years: 4.50    Types: Cigarettes   Smokeless tobacco: Never   Tobacco comments:    smokes 3 cig a day ARJ 02/15/21  Substance Use Topics   Alcohol use: Yes    Alcohol/week: 2.0 standard drinks of alcohol    Types: 2 Cans of beer per week    Comment: twice a month    Family History :  Reviewed by me    Family History  Problem Relation Age of Onset   Hypertension Mother    Diabetes Paternal Aunt    Diabetes Paternal Uncle    Other Father        blood clots   Cancer Maternal Grandmother    Heart attack Paternal Grandmother    Colon cancer Neg Hx      Home Medications:   Prior to Admission medications   Medication Sig Start Date End Date Taking? Authorizing Provider  albuterol (VENTOLIN HFA) 108 (90 Base) MCG/ACT inhaler Inhale 2 puffs into the lungs every 2 (two) hours as needed for wheezing or shortness of breath. 09/13/21  Yes Rigoberto Noel, MD  atorvastatin (LIPITOR) 20 MG tablet Take 1 tablet (20 mg total) by mouth daily. 09/10/21  Yes Branch, Alphonse Guild, MD  furosemide (LASIX) 80 MG tablet Take 1 tablet (80 mg total) by mouth daily. 06/23/21  Yes Regalado, Belkys A, MD  metoprolol succinate (TOPROL-XL) 25 MG 24 hr tablet Take 0.5 tablets (12.5 mg total) by mouth daily. 06/22/21  Yes Regalado, Belkys A, MD  spironolactone (ALDACTONE) 25 MG tablet Take 0.5 tablets (12.5 mg total) by mouth daily. 06/22/21  Yes Regalado, Belkys A, MD  warfarin (COUMADIN) 10 MG tablet TAKE (1) OR (1)  1/2 TABLET BY MOUTH DAILY AS DIRECTED. Patient taking differently: Take 10-15 tablets by mouth  every evening. Take 1 tablet (10 mg) by mouth daily; except on Tuesday and Thursday take 1.5 tablets (15 mg). 06/27/21  Yes Branch, Alphonse Guild, MD     Allergies:     Allergies  Allergen Reactions   Sulfa Antibiotics Itching, Swelling and Rash    Lips swelling     Physical Exam:   Vitals  Blood pressure 115/72, pulse 68, temperature 98.3 F (36.8 C), temperature source Oral, resp. rate 20, height 5' 10" (1.778 m), weight 122.5 kg, last menstrual period 08/27/2021, SpO2 100 %.  Physical Examination: General appearance - alert, obese in no distress  Mental status - alert, oriented to person, place, and time,  Eyes - sclera anicteric Neck - supple, no JVD elevation , Chest - clear  to auscultation bilaterally, symmetrical air movement,  Heart - S1 and S2 normal, regular  Abdomen - soft, nontender, nondistended, +BS, increased truncal adiposity Neurological - screening mental status exam normal, neck supple without rigidity, cranial nerves II through XII intact, DTR's normal and symmetric Extremities - no pedal edema noted, intact peripheral pulses  Skin - warm, dry   Data Review:    CBC Recent Labs  Lab 09/22/21 1543  WBC 5.8  HGB 7.6*  HCT 28.5*  PLT 325  MCV 66.1*  MCH 17.6*  MCHC 26.7*  RDW 21.0*  LYMPHSABS 1.5  MONOABS 0.6  EOSABS 0.1  BASOSABS 0.1   ------------------------------------------------------------------------------------------------------------------  Chemistries  Recent Labs  Lab 09/22/21 1543  NA 131*  K 3.5  CL 98  CO2 24  GLUCOSE 91  BUN 19  CREATININE 1.13*  CALCIUM 8.7*  AST 59*  ALT 62*  ALKPHOS 47  BILITOT 1.1   ------------------------------------------------------------------------------------------------------------------ estimated creatinine clearance is 90.4 mL/min (A) (by C-G formula based on SCr of 1.13 mg/dL  (H)). ------------------------------------------------------------------------------------------------------------------  Coagulation profile Recent Labs  Lab 09/22/21 1543  INR 4.7*   -----------------------------------------------------------------------------------------------------------------    Component Value Date/Time   BNP 1,362.0 (H) 09/22/2021 1543   Urinalysis    Component Value Date/Time   COLORURINE YELLOW 09/22/2021 1506   APPEARANCEUR HAZY (A) 09/22/2021 1506   APPEARANCEUR Cloudy (A) 07/21/2014 1326   LABSPEC 1.008 09/22/2021 1506   PHURINE 6.0 09/22/2021 1506   GLUCOSEU NEGATIVE 09/22/2021 1506   HGBUR NEGATIVE 09/22/2021 1506   BILIRUBINUR NEGATIVE 09/22/2021 1506   BILIRUBINUR Negative 07/21/2014 1326   KETONESUR NEGATIVE 09/22/2021 1506   PROTEINUR NEGATIVE 09/22/2021 1506   UROBILINOGEN 0.2 06/30/2014 0910   NITRITE NEGATIVE 09/22/2021 1506   LEUKOCYTESUR TRACE (A) 09/22/2021 1506   ----------------------------------------------------------------------------------------------------------------   Imaging Results:    CT Head Wo Contrast  Result Date: 09/22/2021 CLINICAL DATA:  Syncope yesterday, fall EXAM: CT HEAD WITHOUT CONTRAST CT CERVICAL SPINE WITHOUT CONTRAST TECHNIQUE: Multidetector CT imaging of the head and cervical spine was performed following the standard protocol without intravenous contrast. Multiplanar CT image reconstructions of the cervical spine were also generated. RADIATION DOSE REDUCTION: This exam was performed according to the departmental dose-optimization program which includes automated exposure control, adjustment of the mA and/or kV according to patient size and/or use of iterative reconstruction technique. COMPARISON:  None Available. FINDINGS: CT HEAD FINDINGS Brain: No evidence of acute infarction, hemorrhage, hydrocephalus, extra-axial collection or mass lesion/mass effect. Vascular: No hyperdense vessel or unexpected  calcification. Skull: Normal. Negative for fracture or focal lesion. Sinuses/Orbits: No acute finding. Other: None. CT CERVICAL SPINE FINDINGS Alignment: Normal. Skull  base and vertebrae: Mildly displaced anterior corner type fracture of the body of C2 (series 5, image 30). No primary bone lesion or focal pathologic process. Soft tissues and spinal canal: No prevertebral fluid or swelling. No visible canal hematoma. Disc levels: Mild multilevel disc space height loss and osteophytosis. Upper chest: Negative. Other: None. IMPRESSION: 1. No acute intracranial pathology. 2. Mildly displaced anterior corner type fracture of the body of C2. No other fracture of the cervical spine. These results were called by telephone at the time of interpretation on 09/22/2021 at 3:49 pm to [facility] , who verbally acknowledged these results. Electronically Signed   By: Delanna Ahmadi M.D.   On: 09/22/2021 15:49   CT Cervical Spine Wo Contrast  Result Date: 09/22/2021 CLINICAL DATA:  Syncope yesterday, fall EXAM: CT HEAD WITHOUT CONTRAST CT CERVICAL SPINE WITHOUT CONTRAST TECHNIQUE: Multidetector CT imaging of the head and cervical spine was performed following the standard protocol without intravenous contrast. Multiplanar CT image reconstructions of the cervical spine were also generated. RADIATION DOSE REDUCTION: This exam was performed according to the departmental dose-optimization program which includes automated exposure control, adjustment of the mA and/or kV according to patient size and/or use of iterative reconstruction technique. COMPARISON:  None Available. FINDINGS: CT HEAD FINDINGS Brain: No evidence of acute infarction, hemorrhage, hydrocephalus, extra-axial collection or mass lesion/mass effect. Vascular: No hyperdense vessel or unexpected calcification. Skull: Normal. Negative for fracture or focal lesion. Sinuses/Orbits: No acute finding. Other: None. CT CERVICAL SPINE FINDINGS Alignment: Normal. Skull base and  vertebrae: Mildly displaced anterior corner type fracture of the body of C2 (series 5, image 30). No primary bone lesion or focal pathologic process. Soft tissues and spinal canal: No prevertebral fluid or swelling. No visible canal hematoma. Disc levels: Mild multilevel disc space height loss and osteophytosis. Upper chest: Negative. Other: None. IMPRESSION: 1. No acute intracranial pathology. 2. Mildly displaced anterior corner type fracture of the body of C2. No other fracture of the cervical spine. These results were called by telephone at the time of interpretation on 09/22/2021 at 3:49 pm to [facility] , who verbally acknowledged these results. Electronically Signed   By: Delanna Ahmadi M.D.   On: 09/22/2021 15:49    Radiological Exams on Admission: CT Head Wo Contrast  Result Date: 09/22/2021 CLINICAL DATA:  Syncope yesterday, fall EXAM: CT HEAD WITHOUT CONTRAST CT CERVICAL SPINE WITHOUT CONTRAST TECHNIQUE: Multidetector CT imaging of the head and cervical spine was performed following the standard protocol without intravenous contrast. Multiplanar CT image reconstructions of the cervical spine were also generated. RADIATION DOSE REDUCTION: This exam was performed according to the departmental dose-optimization program which includes automated exposure control, adjustment of the mA and/or kV according to patient size and/or use of iterative reconstruction technique. COMPARISON:  None Available. FINDINGS: CT HEAD FINDINGS Brain: No evidence of acute infarction, hemorrhage, hydrocephalus, extra-axial collection or mass lesion/mass effect. Vascular: No hyperdense vessel or unexpected calcification. Skull: Normal. Negative for fracture or focal lesion. Sinuses/Orbits: No acute finding. Other: None. CT CERVICAL SPINE FINDINGS Alignment: Normal. Skull base and vertebrae: Mildly displaced anterior corner type fracture of the body of C2 (series 5, image 30). No primary bone lesion or focal pathologic process.  Soft tissues and spinal canal: No prevertebral fluid or swelling. No visible canal hematoma. Disc levels: Mild multilevel disc space height loss and osteophytosis. Upper chest: Negative. Other: None. IMPRESSION: 1. No acute intracranial pathology. 2. Mildly displaced anterior corner type fracture of the body of C2. No  other fracture of the cervical spine. These results were called by telephone at the time of interpretation on 09/22/2021 at 3:49 pm to [facility] , who verbally acknowledged these results. Electronically Signed   By: Delanna Ahmadi M.D.   On: 09/22/2021 15:49   CT Cervical Spine Wo Contrast  Result Date: 09/22/2021 CLINICAL DATA:  Syncope yesterday, fall EXAM: CT HEAD WITHOUT CONTRAST CT CERVICAL SPINE WITHOUT CONTRAST TECHNIQUE: Multidetector CT imaging of the head and cervical spine was performed following the standard protocol without intravenous contrast. Multiplanar CT image reconstructions of the cervical spine were also generated. RADIATION DOSE REDUCTION: This exam was performed according to the departmental dose-optimization program which includes automated exposure control, adjustment of the mA and/or kV according to patient size and/or use of iterative reconstruction technique. COMPARISON:  None Available. FINDINGS: CT HEAD FINDINGS Brain: No evidence of acute infarction, hemorrhage, hydrocephalus, extra-axial collection or mass lesion/mass effect. Vascular: No hyperdense vessel or unexpected calcification. Skull: Normal. Negative for fracture or focal lesion. Sinuses/Orbits: No acute finding. Other: None. CT CERVICAL SPINE FINDINGS Alignment: Normal. Skull base and vertebrae: Mildly displaced anterior corner type fracture of the body of C2 (series 5, image 30). No primary bone lesion or focal pathologic process. Soft tissues and spinal canal: No prevertebral fluid or swelling. No visible canal hematoma. Disc levels: Mild multilevel disc space height loss and osteophytosis. Upper chest:  Negative. Other: None. IMPRESSION: 1. No acute intracranial pathology. 2. Mildly displaced anterior corner type fracture of the body of C2. No other fracture of the cervical spine. These results were called by telephone at the time of interpretation on 09/22/2021 at 3:49 pm to [facility] , who verbally acknowledged these results. Electronically Signed   By: Delanna Ahmadi M.D.   On: 09/22/2021 15:49    DVT Prophylaxis -SCD/Coumadin therapy AM Labs Ordered, also please review Full Orders  Family Communication: Admission, patients condition and plan of care including tests being ordered have been discussed with the patient who indicate understanding and agree with the plan   Condition   stable  Roxan Hockey M.D on 09/22/2021 at 8:30 PM Go to [email or web address] -  for contact info  [facility] - Office  [phone]

## 2021-09-22 NOTE — ED Notes (Signed)
Blood being started by [facility]

## 2021-09-22 NOTE — ED Notes (Signed)
I put pt on bedside commode and collected urine. Put pt back in the bed and hooked back up to monitor

## 2021-09-22 NOTE — ED Provider Notes (Signed)
[facility] EMERGENCY DEPARTMENT Provider Note   CSN: [id number] Arrival date & time: 09/22/21  1430     History  Chief Complaint  Patient presents with   Dizziness    [name] [name] is a 46 y.o. female.   Dizziness  This patient is a 46 year old female, she has a history of a saddle pulmonary embolism in 2015 which was unprovoked, she is currently on Coumadin, she also has a history of pulmonary hypertension secondary to left ventricular dysfunction and some mild congestive heart failure.  Her last echocardiogram was in May, she was intubated at that time during a prolonged stay in the hospital but has been successfully extubated and for the last several months has been at home doing well.  She does take Lasix 80 mg in the morning and if she feels like she is more swollen she takes an extra half a tablet in the evening.  She reports that yesterday she had a period of dizziness while she was getting into a truck, she fell face forward out of the truck when she passed out and landed face first on the ground, she has had neck pain and a feeling of headache and dizziness since that time.  She has no changes in her vision, no nausea or vomiting, no chest pain or shortness of breath, she does feel like she is increasingly swollen today and took an extra half a dose of Lasix.  She denies urinary frequency hematuria diarrhea or rectal bleeding rashes.  No other new medications.  He denies vertigo    Home Medications Prior to Admission medications   Medication Sig Start Date End Date Taking? Authorizing Provider  albuterol (VENTOLIN HFA) 108 (90 Base) MCG/ACT inhaler Inhale 2 puffs into the lungs every 2 (two) hours as needed for wheezing or shortness of breath. 09/13/21  Yes Rigoberto Noel, MD  atorvastatin (LIPITOR) 20 MG tablet Take 1 tablet (20 mg total) by mouth daily. 09/10/21  Yes Branch, Alphonse Guild, MD  furosemide (LASIX) 80 MG tablet Take 1 tablet (80 mg total) by mouth daily. 06/23/21  Yes  Regalado, Belkys A, MD  metoprolol succinate (TOPROL-XL) 25 MG 24 hr tablet Take 0.5 tablets (12.5 mg total) by mouth daily. 06/22/21  Yes Regalado, Belkys A, MD  spironolactone (ALDACTONE) 25 MG tablet Take 0.5 tablets (12.5 mg total) by mouth daily. 06/22/21  Yes Regalado, Belkys A, MD  warfarin (COUMADIN) 10 MG tablet TAKE (1) OR (1) 1/2 TABLET BY MOUTH DAILY AS DIRECTED. Patient taking differently: Take 10-15 tablets by mouth every evening. Take 1 tablet (10 mg) by mouth daily; except on Tuesday and Thursday take 1.5 tablets (15 mg). 06/27/21  Yes BranchAlphonse Guild, MD      Allergies    Sulfa antibiotics    Review of Systems   Review of Systems  Neurological:  Positive for dizziness.  All other systems reviewed and are negative.   Physical Exam Updated Vital Signs BP 98/71   Pulse 70   Temp 98.6 F (37 C) (Oral)   Resp (!) 22   Ht 1.778 m ('5\' 10"'$ )   Wt 122.5 kg   LMP 08/27/2021 (Approximate)   SpO2 100%   BMI 38.74 kg/m  Physical Exam Vitals and nursing note reviewed.  Constitutional:      General: She is not in acute distress.    Appearance: She is well-developed.  HENT:     Head: Normocephalic.     Comments: Abrasions and hematoma to the left  forehead    Mouth/Throat:     Pharynx: No oropharyngeal exudate.  Eyes:     General: No scleral icterus.       Right eye: No discharge.        Left eye: No discharge.     Conjunctiva/sclera: Conjunctivae normal.     Pupils: Pupils are equal, round, and reactive to light.     Comments: Normal extraocular movements, normal peripheral visual fields, normal pupillary exam.  The left upper lid is swollen and bruised  Neck:     Thyroid: No thyromegaly.     Vascular: No JVD.  Cardiovascular:     Rate and Rhythm: Normal rate and regular rhythm.     Heart sounds: Normal heart sounds. No murmur heard.    No friction rub. No gallop.  Pulmonary:     Effort: Pulmonary effort is normal. No respiratory distress.     Breath sounds: Normal  breath sounds. No wheezing or rales.  Abdominal:     General: Bowel sounds are normal. There is no distension.     Palpations: Abdomen is soft. There is no mass.     Tenderness: There is no abdominal tenderness.  Musculoskeletal:        General: Tenderness present. Normal range of motion.     Cervical back: Normal range of motion and neck supple.     Right lower leg: Edema present.     Left lower leg: Edema present.     Comments: There is tenderness over the cervical spine  Lymphadenopathy:     Cervical: No cervical adenopathy.  Skin:    General: Skin is warm and dry.     Findings: No erythema or rash.  Neurological:     Mental Status: She is alert.     Coordination: Coordination normal.     Comments: Able to follow all commands, speaks in normal sentences, normal strength sensation and gait  Psychiatric:        Behavior: Behavior normal.     ED Results / Procedures / Treatments   Labs (all labs ordered are listed, but only abnormal results are displayed) Labs Reviewed  PROTIME-INR - Abnormal; Notable for the following components:      Result Value   Prothrombin Time 44.0 (*)    INR 4.7 (*)    All other components within normal limits  CBC WITH DIFFERENTIAL/PLATELET - Abnormal; Notable for the following components:   Hemoglobin 7.6 (*)    HCT 28.5 (*)    MCV 66.1 (*)    MCH 17.6 (*)    MCHC 26.7 (*)    RDW 21.0 (*)    nRBC 0.9 (*)    All other components within normal limits  BRAIN NATRIURETIC PEPTIDE - Abnormal; Notable for the following components:   B Natriuretic Peptide 1,362.0 (*)    All other components within normal limits  COMPREHENSIVE METABOLIC PANEL - Abnormal; Notable for the following components:   Sodium 131 (*)    Creatinine, Ser 1.13 (*)    Calcium 8.7 (*)    Albumin 3.3 (*)    AST 59 (*)    ALT 62 (*)    All other components within normal limits  TROPONIN I (HIGH SENSITIVITY) - Abnormal; Notable for the following components:   Troponin I (High  Sensitivity) 40 (*)    All other components within normal limits  URINALYSIS, ROUTINE W REFLEX MICROSCOPIC  PREPARE RBC (CROSSMATCH)    EKG EKG Interpretation  Date/Time:  Saturday September 22 2021 14:43:47 EDT Ventricular Rate:  72 PR Interval:  143 QRS Duration: 112 QT Interval:  429 QTC Calculation: 470 R Axis:   -74 Text Interpretation: Sinus rhythm Borderline IVCD with LAD Right ventricular hypertrophy Abnormal inferior Q waves Nonspecific T abnormalities, anterior leads since last tracing no significant change Confirmed by Noemi Chapel [phone]) on 09/22/2021 2:53:22 PM  Radiology CT Head Wo Contrast  Result Date: 09/22/2021 CLINICAL DATA:  Syncope yesterday, fall EXAM: CT HEAD WITHOUT CONTRAST CT CERVICAL SPINE WITHOUT CONTRAST TECHNIQUE: Multidetector CT imaging of the head and cervical spine was performed following the standard protocol without intravenous contrast. Multiplanar CT image reconstructions of the cervical spine were also generated. RADIATION DOSE REDUCTION: This exam was performed according to the departmental dose-optimization program which includes automated exposure control, adjustment of the mA and/or kV according to patient size and/or use of iterative reconstruction technique. COMPARISON:  None Available. FINDINGS: CT HEAD FINDINGS Brain: No evidence of acute infarction, hemorrhage, hydrocephalus, extra-axial collection or mass lesion/mass effect. Vascular: No hyperdense vessel or unexpected calcification. Skull: Normal. Negative for fracture or focal lesion. Sinuses/Orbits: No acute finding. Other: None. CT CERVICAL SPINE FINDINGS Alignment: Normal. Skull base and vertebrae: Mildly displaced anterior corner type fracture of the body of C2 (series 5, image 30). No primary bone lesion or focal pathologic process. Soft tissues and spinal canal: No prevertebral fluid or swelling. No visible canal hematoma. Disc levels: Mild multilevel disc space height loss and  osteophytosis. Upper chest: Negative. Other: None. IMPRESSION: 1. No acute intracranial pathology. 2. Mildly displaced anterior corner type fracture of the body of C2. No other fracture of the cervical spine. These results were called by telephone at the time of interpretation on 09/22/2021 at 3:49 pm to [facility] , who verbally acknowledged these results. Electronically Signed   By: Delanna Ahmadi M.D.   On: 09/22/2021 15:49   CT Cervical Spine Wo Contrast  Result Date: 09/22/2021 CLINICAL DATA:  Syncope yesterday, fall EXAM: CT HEAD WITHOUT CONTRAST CT CERVICAL SPINE WITHOUT CONTRAST TECHNIQUE: Multidetector CT imaging of the head and cervical spine was performed following the standard protocol without intravenous contrast. Multiplanar CT image reconstructions of the cervical spine were also generated. RADIATION DOSE REDUCTION: This exam was performed according to the departmental dose-optimization program which includes automated exposure control, adjustment of the mA and/or kV according to patient size and/or use of iterative reconstruction technique. COMPARISON:  None Available. FINDINGS: CT HEAD FINDINGS Brain: No evidence of acute infarction, hemorrhage, hydrocephalus, extra-axial collection or mass lesion/mass effect. Vascular: No hyperdense vessel or unexpected calcification. Skull: Normal. Negative for fracture or focal lesion. Sinuses/Orbits: No acute finding. Other: None. CT CERVICAL SPINE FINDINGS Alignment: Normal. Skull base and vertebrae: Mildly displaced anterior corner type fracture of the body of C2 (series 5, image 30). No primary bone lesion or focal pathologic process. Soft tissues and spinal canal: No prevertebral fluid or swelling. No visible canal hematoma. Disc levels: Mild multilevel disc space height loss and osteophytosis. Upper chest: Negative. Other: None. IMPRESSION: 1. No acute intracranial pathology. 2. Mildly displaced anterior corner type fracture of the body of C2. No other  fracture of the cervical spine. These results were called by telephone at the time of interpretation on 09/22/2021 at 3:49 pm to [facility] , who verbally acknowledged these results. Electronically Signed   By: Delanna Ahmadi M.D.   On: 09/22/2021 15:49    Procedures .Critical Care  Performed by: Noemi Chapel, MD Authorized by: Noemi Chapel, MD  Critical care provider statement:    Critical care time (minutes):  30   Critical care time was exclusive of:  Teaching time and separately billable procedures and treating other patients   Critical care was necessary to treat or prevent imminent or life-threatening deterioration of the following conditions:  Trauma   Critical care was time spent personally by me on the following activities:  Development of treatment plan with patient or surrogate, discussions with consultants, evaluation of patient's response to treatment, examination of patient, ordering and review of laboratory studies, ordering and review of radiographic studies, ordering and performing treatments and interventions, pulse oximetry, re-evaluation of patient's condition, review of old charts and obtaining history from patient or surrogate   I assumed direction of critical care for this patient from another provider in my specialty: no     Care discussed with: admitting provider   Comments:           Medications Ordered in ED Medications  0.9 %  sodium chloride infusion (has no administration in time range)    ED Course/ Medical Decision Making/ A&P                           Medical Decision Making Amount and/or Complexity of Data Reviewed Labs: ordered. Radiology: ordered.  Risk Prescription drug management. Decision regarding hospitalization.   This patient presents to the ED for concern of head injury after syncopal event yesterday, patient has been intermittently lightheaded with headache, this involves an extensive number of treatment options, and is a  complaint that carries with it a high risk of complications and morbidity.  The differential diagnosis includes intracranial hemorrhage, concussion, electrolyte abnormalities, the patient has clear lungs and normal heart sounds without significant peripheral edema compared to baseline.  Would consider pulmonary hypertension or congestive heart failure as other potential causes but the patient is not in distress and has normal vital signs   Co morbidities that complicate the patient evaluation  Congestive heart failure Ongoing tobacco use Pulmonary hypertension Pulmonary embolism   Additional history obtained:  Additional history obtained from electronic medical record External records from outside source obtained and reviewed including prior echocardiogram, 2023   Lab Tests:  I Ordered, and personally interpreted labs.  The pertinent results include: Coagulopathic with an INR over 4, anemic with a hemoglobin below 8   Imaging Studies ordered:  I ordered imaging studies including CT scan of the head and cervical spine I independently visualized and interpreted imaging which showed anterior C2 fracture, no intracranial hemorrhage I agree with the radiologist interpretation   Cardiac Monitoring: / EKG:  The patient was maintained on a cardiac monitor.  I personally viewed and interpreted the cardiac monitored which showed an underlying rhythm of: Normal sinus rhythm   Consultations Obtained:  I requested consultation with the neurosurgery team, with Dr. Trenton Gammon, nurse practitioner called back and recommended cervical collar and follow-up in 2 weeks with Dr. Trenton Gammon, consulted with hospitalist and discussed lab and imaging findings as well as pertinent plan - they recommend: Admission   Problem List / ED Course / Critical interventions / Medication management  Transfusion started for anemia, cardiac monitoring I ordered medication including blood transfusion for anemia Reevaluation  of the patient after these medicines showed that the patient improving I have reviewed the patients home medicines and have made adjustments as needed   Social Determinants of Health:  Severely anemic on chronic anticoagulation   Test / Admission -  Considered:  We will need to be admitted to the hospital Critically low anemia Syncope requiring cardiac monitoring Cervical fracture requiring immobilization and follow-up within 2 weeks        Final Clinical Impression(s) / ED Diagnoses Final diagnoses:  Syncope and collapse  Facial hematoma, initial encounter  Other closed nondisplaced fracture of second cervical vertebra, initial encounter ([place])  Severe anemia    Rx / DC Orders ED Discharge Orders     None         Noemi Chapel, MD 09/22/21 1703

## 2021-09-22 NOTE — Progress Notes (Signed)
Patient refused CPAP HS tonight. Patient stated she would try to wear tomorrow.

## 2021-09-22 NOTE — Progress Notes (Signed)
ANTICOAGULATION CONSULT NOTE - Initial Consult  Pharmacy Consult for coumadin Indication: pulmonary embolus  Allergies  Allergen Reactions   Sulfa Antibiotics Itching, Swelling and Rash    Lips swelling    Patient Measurements: Height: '5\' 10"'$  (177.8 cm) Weight: 122.5 kg (270 lb) IBW/kg (Calculated) : 68.5 Heparin Dosing Weight:   Vital Signs: Temp: 98.4 F (36.9 C) (09/02 2051) Temp Source: Oral (09/02 2051) BP: 102/84 (09/02 2051) Pulse Rate: 68 (09/02 2051)  Labs: Recent Labs    09/22/21 1543  HGB 7.6*  HCT 28.5*  PLT 325  LABPROT 44.0*  INR 4.7*  CREATININE 1.13*  TROPONINIHS 40*    Estimated Creatinine Clearance: 90.4 mL/min (A) (by C-G formula based on SCr of 1.13 mg/dL (H)).   Medical History: Past Medical History:  Diagnosis Date   Abdominal cramps 08/30/2015   Anemia    Blood transfusion without reported diagnosis    Closed left ankle fracture    August 30 2012   Clotting disorder [facility])    DVT (deep venous thrombosis) (HCC)    L leg   Fibroid tumor    Hidradenitis    High blood pressure    Hypertension    Obesity    OSA on CPAP    Oxygen deficiency    Pregnancy    09/20/14 17 weeks   Pulmonary embolism ([place]) 04/2013   Sleep apnea    Tobacco use disorder     Medications:  Medications Prior to Admission  Medication Sig Dispense Refill Last Dose   albuterol (VENTOLIN HFA) 108 (90 Base) MCG/ACT inhaler Inhale 2 puffs into the lungs every 2 (two) hours as needed for wheezing or shortness of breath. 18 g 11 unknown   atorvastatin (LIPITOR) 20 MG tablet Take 1 tablet (20 mg total) by mouth daily. 90 tablet 3    furosemide (LASIX) 80 MG tablet Take 1 tablet (80 mg total) by mouth daily. 30 tablet 3 09/22/2021   metoprolol succinate (TOPROL-XL) 25 MG 24 hr tablet Take 0.5 tablets (12.5 mg total) by mouth daily. 60 tablet 0 09/22/2021 at 0930   spironolactone (ALDACTONE) 25 MG tablet Take 0.5 tablets (12.5 mg total) by mouth daily. 30 tablet 3 09/22/2021    warfarin (COUMADIN) 10 MG tablet TAKE (1) OR (1) 1/2 TABLET BY MOUTH DAILY AS DIRECTED. (Patient taking differently: Take 10-15 tablets by mouth every evening. Take 1 tablet (10 mg) by mouth daily; except on Tuesday and Thursday take 1.5 tablets (15 mg).) 45 tablet 2 09/21/2021 at 1700   Scheduled:   [START ON 09/23/2021] atorvastatin  20 mg Oral Daily   [START ON 09/23/2021] furosemide  80 mg Oral Daily   [START ON 09/23/2021] metoprolol succinate  12.5 mg Oral Daily   sodium chloride flush  3 mL Intravenous Q12H   sodium chloride flush  3 mL Intravenous Q12H   [START ON 09/23/2021] spironolactone  12.5 mg Oral Daily   [START ON 09/23/2021] Warfarin - Pharmacist Dosing Inpatient   Does not apply q1600   Infusions:   sodium chloride      Assessment: Pt with a hx of saddle PE in 2015. She has been on coumadin for anticoagulation. INR 4.7 today. She was admitted today for dizziness.  Hgb 7.6 Plt wnl  Goal of Therapy:  INR 2-3 Monitor platelets by anticoagulation protocol: Yes   Plan:  No coumadin today Daily INR  Onnie Boer, PharmD, BCIDP, AAHIVP, CPP Infectious Disease Pharmacist 09/22/2021 8:58 PM

## 2021-09-22 NOTE — ED Triage Notes (Signed)
Pt to er, pt states that she is here for dizziness since yesterday, states that yesterday she also passed out.  States that she thought that she would be ok, but today when she bent over she felt pressure in her head and more dizziness. Pt also has a headache.

## 2021-09-22 NOTE — ED Notes (Signed)
[facility] arrived  

## 2021-09-23 DIAGNOSIS — S12101A Unspecified nondisplaced fracture of second cervical vertebra, initial encounter for closed fracture: Secondary | ICD-10-CM | POA: Diagnosis not present

## 2021-09-23 LAB — CBC
HCT: 29.6 % — ABNORMAL LOW (ref 36.0–46.0)
Hemoglobin: 8.2 g/dL — ABNORMAL LOW (ref 12.0–15.0)
MCH: 18.2 pg — ABNORMAL LOW (ref 26.0–34.0)
MCHC: 27.7 g/dL — ABNORMAL LOW (ref 30.0–36.0)
MCV: 65.8 fL — ABNORMAL LOW (ref 80.0–100.0)
Platelets: 286 10*3/uL (ref 150–400)
RBC: 4.5 MIL/uL (ref 3.87–5.11)
RDW: 21.1 % — ABNORMAL HIGH (ref 11.5–15.5)
WBC: 6 10*3/uL (ref 4.0–10.5)
nRBC: 1.2 % — ABNORMAL HIGH (ref 0.0–0.2)

## 2021-09-23 LAB — BASIC METABOLIC PANEL
Anion gap: 7 (ref 5–15)
BUN: 16 mg/dL (ref 6–20)
CO2: 23 mmol/L (ref 22–32)
Calcium: 8.6 mg/dL — ABNORMAL LOW (ref 8.9–10.3)
Chloride: 102 mmol/L (ref 98–111)
Creatinine, Ser: 1.13 mg/dL — ABNORMAL HIGH (ref 0.44–1.00)
GFR, Estimated: >60 mL/min (ref >60)
Glucose, Bld: 108 mg/dL — ABNORMAL HIGH (ref 70–99)
Potassium: 3.6 mmol/L (ref 3.5–5.1)
Sodium: 132 mmol/L — ABNORMAL LOW (ref 135–145)

## 2021-09-23 LAB — TYPE AND SCREEN
ABO/RH(D): A POS
Antibody Screen: NEGATIVE
Unit division: 0

## 2021-09-23 LAB — PROTIME-INR
INR: 5.4 (ref 0.8–1.2)
Prothrombin Time: 48.7 seconds — ABNORMAL HIGH (ref 11.4–15.2)

## 2021-09-23 LAB — BPAM RBC
Blood Product Expiration Date: 202309252359
ISSUE DATE / TIME: 202309021943
Unit Type and Rh: 6200

## 2021-09-23 LAB — TROPONIN I (HIGH SENSITIVITY): Troponin I (High Sensitivity): 46 ng/L — ABNORMAL HIGH (ref <18)

## 2021-09-23 LAB — MAGNESIUM: Magnesium: 1.8 mg/dL (ref 1.7–2.4)

## 2021-09-23 MED ORDER — TRAMADOL HCL 50 MG PO TABS
50.0000 mg | ORAL_TABLET | Freq: Four times a day (QID) | ORAL | Status: DC | PRN
  Administered 2021-09-23 (×2): 50 mg via ORAL
  Filled 2021-09-23 (×2): qty 1

## 2021-09-23 MED ORDER — MUPIROCIN CALCIUM 2 % EX CREA
TOPICAL_CREAM | Freq: Two times a day (BID) | CUTANEOUS | Status: DC
  Filled 2021-09-23: qty 15

## 2021-09-23 NOTE — Progress Notes (Signed)
Pt to 4NP03 from [facility] ED via [facility], one bag of belongings with pt, blood transfusion still infusing on arrival, VSS full assessment charted. Pt educated on use of call bell, left with bed in lowest position, all needs met  Perfecto Kingdom, RN

## 2021-09-23 NOTE — Progress Notes (Signed)
PROGRESS NOTE   [name] [name]  [id number]:[id number] DOB: [birth year] DOA: 09/22/2021 PCP: Carrolyn Meiers, MD  Brief Narrative:  46 year old black female known history of DVT PE 2015 IVC filter prior on Xarelto --- recurrence DVT/PE January 2023 status post thrombectomy on lifelong anticoagulation with Coumadin secondary to weight >250 pounds OSA on CPAP Known uterine fibroids Smoker Admission 06/05/2021 community-acquired pneumonia-- that admission was intubated-- HFrEF with acute exacerbation Echo 05/2021 --45-50%--diuresed aggressively 8 L cardiac cath showed nonobstructive disease discharged on Lasix Aldactone metoprolol and a Lovenox bridge as well as a dysphagia diet  Patient felt dizzy while trying to get into the truck-her own report states that she felt tired and nauseous-probably might of vagal passed out landing on the ground face first -hyperextended CT C-spine showed mildly displaced anterior corner type fracture body of C2 Neurosurgery consulted not recommending any operative management Hemoglobin 7 BUN/creatinine 19/1.1 sodium 131 WBC 5.8 INR 4.7  Hospital-Problem based course  Syncope Likely severe vasovagal-keep on monitors-check for occult arrhythmia with Zio patch in outpatient setting given prior history of  Decreased EF--continue low-dose metoprolol 12.5 XL daily Troponin elevation nonconcerning did not think it was a cardiac event trend is flat EKG however does show ST-T wave depressions in V2 so we will ask for another EKG and repeat echo  Coroner type fracture body of C2 EDP discussed with Dr. Trenton Gammon of neurosurgery who recommended 6-week follow-up , keep in [facility] brace  HFrEF-compensated & Chronic Seems euvolemic continue Lasix 80 daily, Toprol as above, Aldactone 12.5 daily  Current DVT PE 2015/2023 status post thrombectomy prior IVC filter INR is elevated -cannot use DOAC because weight is above 250 pounds--monitor carefully  Mild hyponatremia Monitor  DVT  prophylaxis: Coumadin Code Status: Full Family Communication: None Disposition:  Status is: Inpatient Remains inpatient appropriate because:   Need work-up and observation overnight   Consultants:  None yet  Procedures: No  Antimicrobials: No   Subjective: Some discomfort not relieved by Tylenol no chest pain at this time no fever She is able to ambulate to the restroom   Objective: Vitals:   09/22/21 2051 09/22/21 2303 09/23/21 0303 09/23/21 0353  BP: 102/84 114/75 120/71   Pulse: 68 71 67   Resp: '20 19 20   '$ Temp: 98.4 F (36.9 C) 98 F (36.7 C) 98.4 F (36.9 C)   TempSrc: Oral Oral Oral   SpO2: 100% 100% 95%   Weight:    122.5 kg  Height:        Intake/Output Summary (Last 24 hours) at 09/23/2021 0719 Last data filed at 09/23/2021 0636 Gross per 24 hour  Intake 324 ml  Output 2000 ml  Net -1676 ml   Filed Weights   09/22/21 1440 09/23/21 0353  Weight: 122.5 kg 122.5 kg    Examination:  Bruise to face, [place] collar in place ROM intact no focal deficit moving 4 limbs equally reflexes are little brisk in knees Abdomen is soft no rebound no guarding Neurologically otherwise is intact Good ROM Chest clear  Data Reviewed: personally reviewed   CBC    Component Value Date/Time   WBC 6.0 09/23/2021 0112   RBC 4.50 09/23/2021 0112   HGB 8.2 (L) 09/23/2021 0112   HGB 12.5 01/18/2015 1653   HCT 29.6 (L) 09/23/2021 0112   HCT 37.7 01/18/2015 1653   PLT 286 09/23/2021 0112   PLT 214 01/18/2015 1653   MCV 65.8 (L) 09/23/2021 0112   MCV 95 01/18/2015 1653   MCH 18.2 (  L) 09/23/2021 0112   MCHC 27.7 (L) 09/23/2021 0112   RDW 21.1 (H) 09/23/2021 0112   RDW 14.3 01/18/2015 1653   LYMPHSABS 1.5 09/22/2021 1543   MONOABS 0.6 09/22/2021 1543   EOSABS 0.1 09/22/2021 1543   BASOSABS 0.1 09/22/2021 1543      Latest Ref Rng & Units 09/23/2021    1:12 AM 09/22/2021    3:43 PM 08/21/2021   11:19 AM  CMP  Glucose 70 - 99 mg/dL 108  91  94   BUN 6 - 20 mg/dL '16  19   22   '$ Creatinine 0.44 - 1.00 mg/dL 1.13  1.13  1.00   Sodium 135 - 145 mmol/L 132  131  139   Potassium 3.5 - 5.1 mmol/L 3.6  3.5  3.9   Chloride 98 - 111 mmol/L 102  98  105   CO2 22 - 32 mmol/L '23  24  26   '$ Calcium 8.9 - 10.3 mg/dL 8.6  8.7  8.9   Total Protein 6.5 - 8.1 g/dL  7.1    Total Bilirubin 0.3 - 1.2 mg/dL  1.1    Alkaline Phos 38 - 126 U/L  47    AST 15 - 41 U/L  59    ALT 0 - 44 U/L  62       Radiology Studies: CT Head Wo Contrast  Result Date: 09/22/2021 CLINICAL DATA:  Syncope yesterday, fall EXAM: CT HEAD WITHOUT CONTRAST CT CERVICAL SPINE WITHOUT CONTRAST TECHNIQUE: Multidetector CT imaging of the head and cervical spine was performed following the standard protocol without intravenous contrast. Multiplanar CT image reconstructions of the cervical spine were also generated. RADIATION DOSE REDUCTION: This exam was performed according to the departmental dose-optimization program which includes automated exposure control, adjustment of the mA and/or kV according to patient size and/or use of iterative reconstruction technique. COMPARISON:  None Available. FINDINGS: CT HEAD FINDINGS Brain: No evidence of acute infarction, hemorrhage, hydrocephalus, extra-axial collection or mass lesion/mass effect. Vascular: No hyperdense vessel or unexpected calcification. Skull: Normal. Negative for fracture or focal lesion. Sinuses/Orbits: No acute finding. Other: None. CT CERVICAL SPINE FINDINGS Alignment: Normal. Skull base and vertebrae: Mildly displaced anterior corner type fracture of the body of C2 (series 5, image 30). No primary bone lesion or focal pathologic process. Soft tissues and spinal canal: No prevertebral fluid or swelling. No visible canal hematoma. Disc levels: Mild multilevel disc space height loss and osteophytosis. Upper chest: Negative. Other: None. IMPRESSION: 1. No acute intracranial pathology. 2. Mildly displaced anterior corner type fracture of the body of C2. No other  fracture of the cervical spine. These results were called by telephone at the time of interpretation on 09/22/2021 at 3:49 pm to [facility] , who verbally acknowledged these results. Electronically Signed   By: Delanna Ahmadi M.D.   On: 09/22/2021 15:49   CT Cervical Spine Wo Contrast  Result Date: 09/22/2021 CLINICAL DATA:  Syncope yesterday, fall EXAM: CT HEAD WITHOUT CONTRAST CT CERVICAL SPINE WITHOUT CONTRAST TECHNIQUE: Multidetector CT imaging of the head and cervical spine was performed following the standard protocol without intravenous contrast. Multiplanar CT image reconstructions of the cervical spine were also generated. RADIATION DOSE REDUCTION: This exam was performed according to the departmental dose-optimization program which includes automated exposure control, adjustment of the mA and/or kV according to patient size and/or use of iterative reconstruction technique. COMPARISON:  None Available. FINDINGS: CT HEAD FINDINGS Brain: No evidence of acute infarction, hemorrhage, hydrocephalus, extra-axial collection or  mass lesion/mass effect. Vascular: No hyperdense vessel or unexpected calcification. Skull: Normal. Negative for fracture or focal lesion. Sinuses/Orbits: No acute finding. Other: None. CT CERVICAL SPINE FINDINGS Alignment: Normal. Skull base and vertebrae: Mildly displaced anterior corner type fracture of the body of C2 (series 5, image 30). No primary bone lesion or focal pathologic process. Soft tissues and spinal canal: No prevertebral fluid or swelling. No visible canal hematoma. Disc levels: Mild multilevel disc space height loss and osteophytosis. Upper chest: Negative. Other: None. IMPRESSION: 1. No acute intracranial pathology. 2. Mildly displaced anterior corner type fracture of the body of C2. No other fracture of the cervical spine. These results were called by telephone at the time of interpretation on 09/22/2021 at 3:49 pm to [facility] , who verbally acknowledged these  results. Electronically Signed   By: Delanna Ahmadi M.D.   On: 09/22/2021 15:49     Scheduled Meds:  atorvastatin  20 mg Oral Daily   furosemide  40 mg Intravenous Q12H   metoprolol succinate  12.5 mg Oral Daily   sodium chloride flush  3 mL Intravenous Q12H   sodium chloride flush  3 mL Intravenous Q12H   spironolactone  12.5 mg Oral Daily   Warfarin - Pharmacist Dosing Inpatient   Does not apply q1600   Continuous Infusions:  sodium chloride       LOS: 1 day   Time spent: Blue, MD [facility] To contact the attending provider between 7A-7P or the covering provider during after hours 7P-7A, please log into the web site [email or web address] and access using universal [place] password for that web site. If you do not have the password, please call the hospital operator.  09/23/2021, 7:19 AM

## 2021-09-23 NOTE — Progress Notes (Signed)
ANTICOAGULATION CONSULT NOTE  Pharmacy Consult for coumadin Indication: h/o pulmonary embolus  Allergies  Allergen Reactions   Sulfa Antibiotics Itching, Swelling and Rash    Lips swelling    Patient Measurements: Height: '5\' 10"'$  (177.8 cm) Weight: 122.5 kg (270 lb 1 oz) IBW/kg (Calculated) : 68.5  Vital Signs: Temp: 97 F (36.1 C) (09/03 0703) Temp Source: Axillary (09/03 0703) BP: 118/75 (09/03 0703) Pulse Rate: 64 (09/03 0703)  Labs: Recent Labs    09/22/21 1543 09/23/21 0112  HGB 7.6* 8.2*  HCT 28.5* 29.6*  PLT 325 286  LABPROT 44.0* 48.7*  INR 4.7* 5.4*  CREATININE 1.13* 1.13*  TROPONINIHS 40*  --      Estimated Creatinine Clearance: 90.4 mL/min (A) (by C-G formula based on SCr of 1.13 mg/dL (H)).   Assessment: 46 y.o. F admitted with dizziness. Pt on warfarin PTA for h/o saddle PE 2015. Admission INR 4.7 (supratherapeutic). Hgb 7.6 on admission -s/p PRBC x1 9/2. Home dose: 10 daily except for '15mg'$  on Tues and Thur. Last dose 9/1. Noted last check 6/14 and INR was 4. Plan at [facility] visit was to hold x 1 day and decrease dose to '10mg'$  daily but I confirmed with pt that she has been taking '10mg'$  daily except for '15mg'$  on Tues and Thur.  INR up to 5.4 today (no coumadin given yesterday). Hgb 8.2. No interacting meds noted.  Goal of Therapy:  INR 2-3 Monitor platelets by anticoagulation protocol: Yes   Plan:  No coumadin today Daily INR  Sherlon Handing, PharmD, BCPS Please see amion for complete clinical pharmacist phone list 09/23/2021 11:58 AM

## 2021-09-23 NOTE — Progress Notes (Signed)
Patient refused CPAP HS tonight. Patient in no distress at thsi time.

## 2021-09-23 NOTE — Progress Notes (Signed)
Patient RN notified by Telemetry of four runs of [place]. Patient is asymptomatic, MD notified. EKG was performed and placed in chart. Continuing to monitor patient.

## 2021-09-23 NOTE — Progress Notes (Signed)
Date and time results received: 09/23/21 0256  Test: INR Critical Value: 5.4  Name of Provider Notified: Ninetta Lights, MD  Orders Received? Or Actions Taken?: no new orders

## 2021-09-24 ENCOUNTER — Inpatient Hospital Stay (HOSPITAL_COMMUNITY): Payer: Medicare HMO

## 2021-09-24 DIAGNOSIS — S12191A Other nondisplaced fracture of second cervical vertebra, initial encounter for closed fracture: Secondary | ICD-10-CM

## 2021-09-24 DIAGNOSIS — R55 Syncope and collapse: Secondary | ICD-10-CM | POA: Diagnosis not present

## 2021-09-24 LAB — ECHOCARDIOGRAM COMPLETE
AR max vel: 2.42 cm2
AV Area VTI: 2.41 cm2
AV Area mean vel: 2.26 cm2
AV Mean grad: 3 mmHg
AV Peak grad: 6.6 mmHg
Ao pk vel: 1.28 m/s
Area-P 1/2: 4.19 cm2
Calc EF: 29.9 %
Height: 70 in
MV M vel: 3.77 m/s
MV Peak grad: 56.9 mmHg
MV VTI: 1.43 cm2
Radius: 0.7 cm
S' Lateral: 5.2 cm
Single Plane A2C EF: 26 %
Single Plane A4C EF: 29.2 %
Weight: 4321.02 oz

## 2021-09-24 LAB — PROTIME-INR
INR: 3.8 — ABNORMAL HIGH (ref 0.8–1.2)
Prothrombin Time: 36.8 seconds — ABNORMAL HIGH (ref 11.4–15.2)

## 2021-09-24 LAB — CBC
HCT: 28.3 % — ABNORMAL LOW (ref 36.0–46.0)
Hemoglobin: 7.5 g/dL — ABNORMAL LOW (ref 12.0–15.0)
MCH: 17.9 pg — ABNORMAL LOW (ref 26.0–34.0)
MCHC: 26.5 g/dL — ABNORMAL LOW (ref 30.0–36.0)
MCV: 67.5 fL — ABNORMAL LOW (ref 80.0–100.0)
Platelets: 248 10*3/uL (ref 150–400)
RBC: 4.19 MIL/uL (ref 3.87–5.11)
RDW: 21.2 % — ABNORMAL HIGH (ref 11.5–15.5)
WBC: 5.3 10*3/uL (ref 4.0–10.5)
nRBC: 0.6 % — ABNORMAL HIGH (ref 0.0–0.2)

## 2021-09-24 LAB — COMPREHENSIVE METABOLIC PANEL
ALT: 72 U/L — ABNORMAL HIGH (ref 0–44)
AST: 55 U/L — ABNORMAL HIGH (ref 15–41)
Albumin: 2.9 g/dL — ABNORMAL LOW (ref 3.5–5.0)
Alkaline Phosphatase: 38 U/L (ref 38–126)
Anion gap: 7 (ref 5–15)
BUN: 17 mg/dL (ref 6–20)
CO2: 28 mmol/L (ref 22–32)
Calcium: 8.6 mg/dL — ABNORMAL LOW (ref 8.9–10.3)
Chloride: 99 mmol/L (ref 98–111)
Creatinine, Ser: 1.25 mg/dL — ABNORMAL HIGH (ref 0.44–1.00)
GFR, Estimated: 55 mL/min — ABNORMAL LOW (ref >60)
Glucose, Bld: 90 mg/dL (ref 70–99)
Potassium: 3.7 mmol/L (ref 3.5–5.1)
Sodium: 134 mmol/L — ABNORMAL LOW (ref 135–145)
Total Bilirubin: 0.8 mg/dL (ref 0.3–1.2)
Total Protein: 6.3 g/dL — ABNORMAL LOW (ref 6.5–8.1)

## 2021-09-24 MED ORDER — METOPROLOL TARTRATE 12.5 MG HALF TABLET
12.5000 mg | ORAL_TABLET | Freq: Once | ORAL | Status: AC
Start: 2021-09-24 — End: 2021-09-24
  Administered 2021-09-24: 12.5 mg via ORAL
  Filled 2021-09-24: qty 1

## 2021-09-24 MED ORDER — METOPROLOL SUCCINATE ER 25 MG PO TB24
25.0000 mg | ORAL_TABLET | Freq: Every day | ORAL | Status: DC
  Administered 2021-09-25: 25 mg via ORAL
  Filled 2021-09-24: qty 1

## 2021-09-24 MED ORDER — TRAMADOL HCL 50 MG PO TABS
100.0000 mg | ORAL_TABLET | Freq: Four times a day (QID) | ORAL | Status: DC | PRN
  Administered 2021-09-24 – 2021-09-25 (×4): 100 mg via ORAL
  Filled 2021-09-24 (×4): qty 2

## 2021-09-24 NOTE — Progress Notes (Signed)
  Echocardiogram 2D Echocardiogram has been performed.  [name] [name] 09/24/2021, 1:51 PM

## 2021-09-24 NOTE — Progress Notes (Addendum)
ANTICOAGULATION CONSULT NOTE  Pharmacy Consult for coumadin Indication: h/o pulmonary embolus  Allergies  Allergen Reactions   Sulfa Antibiotics Itching, Swelling and Rash    Lips swelling    Patient Measurements: Height: '5\' 10"'$  (177.8 cm) Weight: 122.5 kg (270 lb 1 oz) IBW/kg (Calculated) : 68.5  Vital Signs: Temp: 97.8 F (36.6 C) (09/04 1101) Temp Source: Oral (09/04 1101) BP: 122/70 (09/04 1101) Pulse Rate: 80 (09/04 1101)  Labs: Recent Labs    09/22/21 1543 09/23/21 0112 09/24/21 0116  HGB 7.6* 8.2* 7.5*  HCT 28.5* 29.6* 28.3*  PLT 325 286 248  LABPROT 44.0* 48.7* 36.8*  INR 4.7* 5.4* 3.8*  CREATININE 1.13* 1.13* 1.25*  TROPONINIHS 40*  --   --      Estimated Creatinine Clearance: 81.7 mL/min (A) (by C-G formula based on SCr of 1.25 mg/dL (H)).   Assessment: 46 y.o. F admitted with dizziness. Pt on warfarin PTA for h/o saddle PE 2015. Admission INR 4.7 (supratherapeutic). Hgb 7.6 on admission -s/p PRBC x1 9/2. Home dose: 10 daily except for '15mg'$  on Tues and Thur. Last dose 9/1. Noted last check 6/14 and INR was 4. Plan at [facility] visit was to hold x 1 day and decrease dose to '10mg'$  daily but has been confirmed with pt that she has been taking '10mg'$  daily except for '15mg'$  on Tues and Thur.  INR has trended down to 3.8 from 5.4, however still supratherapeutic. Hemoglobin has also trended down to 7.5. No bleeding noted.   Goal of Therapy:  INR 2-3 Monitor platelets by anticoagulation protocol: Yes   Plan:  Will hold warfarin again today, based on rate of fall from 9/3 to 9/4 anticipate that patient will fall into therapeutic range on 9/5 and can begin re-dosing.  Daily INR  Esmeralda Arthur, PharmD  Please see amion for complete clinical pharmacist phone list 09/24/2021 1:36 PM

## 2021-09-24 NOTE — Progress Notes (Signed)
PROGRESS NOTE   [name] [name]  VOZ:[id number] DOB: 05-04-1975 DOA: 09/22/2021 PCP: Carrolyn Meiers, MD  Brief Narrative:  46 year old black female known history of DVT PE 2015 IVC filter prior on Xarelto --- recurrence DVT/PE January 2023 status post thrombectomy on lifelong anticoagulation with Coumadin secondary to weight >250 pounds OSA on CPAP Known uterine fibroids Smoker Admission 06/05/2021 community-acquired pneumonia-- that admission was intubated-- HFrEF with acute exacerbation Echo 05/2021 --45-50%--diuresed aggressively 8 L cardiac cath showed nonobstructive disease discharged on Lasix Aldactone metoprolol and a Lovenox bridge as well as a dysphagia diet  Patient felt dizzy while trying to get into the truck-her own report states that she felt tired and nauseous-probably might of vagal passed out landing on the ground face first -hyperextended CT C-spine showed mildly displaced anterior corner type fracture body of C2 Neurosurgery consulted not recommending any operative management Hemoglobin 7 BUN/creatinine 19/1.1 sodium 131 WBC 5.8 INR 4.7  Because of EKG changes cardiology was curb sided recommending echocardiogram  Hospital-Problem based course  Syncope Likely vasovagal-keep on monitors-check for occult arrhythmia with Zio patch in outpatient setting given prior history of low EF -continue metoprolol 12.5 XL daily Troponin elevation nonconcerning did not think it was a cardiac event trend is flat EKG however does show ST-T wave depressions in V2  -EKG repeat normalized-awaiting echo as per discussion with Dr. Marlou Porch, will need a event monitor at d/c  Corner type fracture body of C2 EDP discussed with Dr. Trenton Gammon of neurosurgery who recommended 6-week follow-up , keep in [place] brace Pain is not controlled today increase tramadol to 100 every 6 and will need prescription on discharge  HFrEF-compensated & Chronic Seems euvolemic continue Lasix 80 daily, Toprol as  above, Aldactone 12.5 daily  Current DVT PE 2015/2023 status post thrombectomy prior IVC filter INR  trending down Coumadin to be managed by pharmacy -cannot use DOAC because weight is above 250 pounds--hemoglobin relatively stable repeat labs a.m.  Mild hyponatremia Monitor-improved from admission  DVT prophylaxis: Coumadin Code Status: Full Family Communication: None Disposition:  Status is: Inpatient Remains inpatient appropriate because:   Pain not controlled awaiting echocardiogram   Consultants:  None yet  Procedures: No  Antimicrobials: No   Subjective:  Pain uncontrolled-ambulating a little better however No chest pain or chest radiation  no fever No further events and no weakness On monitors currently only PVCs although 4 beats of nonsustained VT 9/5   Objective: Vitals:   09/23/21 2342 09/24/21 0315 09/24/21 0738 09/24/21 1101  BP: 114/77 109/72 117/74 122/70  Pulse: 64 61 65 80  Resp: 19 19 (!) 23 17  Temp: 98.3 F (36.8 C) 97.8 F (36.6 C) 97.7 F (36.5 C) 97.8 F (36.6 C)  TempSrc: Oral Oral Oral Oral  SpO2: 99% 100% 97% 99%  Weight:      Height:        Intake/Output Summary (Last 24 hours) at 09/24/2021 1147 Last data filed at 09/24/2021 0800 Gross per 24 hour  Intake 520 ml  Output 2200 ml  Net -1680 ml    Filed Weights   09/22/21 1440 09/23/21 0353  Weight: 122.5 kg 122.5 kg    Examination:  Bruise to face, [place] collar in place ROM intact no focal deficit moving 4 limbs S1-S2 no murmur PVCs noted on monitor Abdomen is soft no rebound no guarding Neurologically otherwise is intact Good ROM Chest clear  Data Reviewed: personally reviewed   CBC    Component Value Date/Time   WBC 5.3  09/24/2021 0116   RBC 4.19 09/24/2021 0116   HGB 7.5 (L) 09/24/2021 0116   HGB 12.5 01/18/2015 1653   HCT 28.3 (L) 09/24/2021 0116   HCT 37.7 01/18/2015 1653   PLT 248 09/24/2021 0116   PLT 214 01/18/2015 1653   MCV 67.5 (L) 09/24/2021 0116    MCV 95 01/18/2015 1653   MCH 17.9 (L) 09/24/2021 0116   MCHC 26.5 (L) 09/24/2021 0116   RDW 21.2 (H) 09/24/2021 0116   RDW 14.3 01/18/2015 1653   LYMPHSABS 1.5 09/22/2021 1543   MONOABS 0.6 09/22/2021 1543   EOSABS 0.1 09/22/2021 1543   BASOSABS 0.1 09/22/2021 1543      Latest Ref Rng & Units 09/24/2021    1:16 AM 09/23/2021    1:12 AM 09/22/2021    3:43 PM  CMP  Glucose 70 - 99 mg/dL 90  108  91   BUN 6 - 20 mg/dL '17  16  19   '$ Creatinine 0.44 - 1.00 mg/dL 1.25  1.13  1.13   Sodium 135 - 145 mmol/L 134  132  131   Potassium 3.5 - 5.1 mmol/L 3.7  3.6  3.5   Chloride 98 - 111 mmol/L 99  102  98   CO2 22 - 32 mmol/L '28  23  24   '$ Calcium 8.9 - 10.3 mg/dL 8.6  8.6  8.7   Total Protein 6.5 - 8.1 g/dL 6.3   7.1   Total Bilirubin 0.3 - 1.2 mg/dL 0.8   1.1   Alkaline Phos 38 - 126 U/L 38   47   AST 15 - 41 U/L 55   59   ALT 0 - 44 U/L 72   62      Radiology Studies: CT Head Wo Contrast  Result Date: 09/22/2021 CLINICAL DATA:  Syncope yesterday, fall EXAM: CT HEAD WITHOUT CONTRAST CT CERVICAL SPINE WITHOUT CONTRAST TECHNIQUE: Multidetector CT imaging of the head and cervical spine was performed following the standard protocol without intravenous contrast. Multiplanar CT image reconstructions of the cervical spine were also generated. RADIATION DOSE REDUCTION: This exam was performed according to the departmental dose-optimization program which includes automated exposure control, adjustment of the mA and/or kV according to patient size and/or use of iterative reconstruction technique. COMPARISON:  None Available. FINDINGS: CT HEAD FINDINGS Brain: No evidence of acute infarction, hemorrhage, hydrocephalus, extra-axial collection or mass lesion/mass effect. Vascular: No hyperdense vessel or unexpected calcification. Skull: Normal. Negative for fracture or focal lesion. Sinuses/Orbits: No acute finding. Other: None. CT CERVICAL SPINE FINDINGS Alignment: Normal. Skull base and vertebrae: Mildly  displaced anterior corner type fracture of the body of C2 (series 5, image 30). No primary bone lesion or focal pathologic process. Soft tissues and spinal canal: No prevertebral fluid or swelling. No visible canal hematoma. Disc levels: Mild multilevel disc space height loss and osteophytosis. Upper chest: Negative. Other: None. IMPRESSION: 1. No acute intracranial pathology. 2. Mildly displaced anterior corner type fracture of the body of C2. No other fracture of the cervical spine. These results were called by telephone at the time of interpretation on 09/22/2021 at 3:49 pm to [facility] , who verbally acknowledged these results. Electronically Signed   By: Delanna Ahmadi M.D.   On: 09/22/2021 15:49   CT Cervical Spine Wo Contrast  Result Date: 09/22/2021 CLINICAL DATA:  Syncope yesterday, fall EXAM: CT HEAD WITHOUT CONTRAST CT CERVICAL SPINE WITHOUT CONTRAST TECHNIQUE: Multidetector CT imaging of the head and cervical spine was performed following the  standard protocol without intravenous contrast. Multiplanar CT image reconstructions of the cervical spine were also generated. RADIATION DOSE REDUCTION: This exam was performed according to the departmental dose-optimization program which includes automated exposure control, adjustment of the mA and/or kV according to patient size and/or use of iterative reconstruction technique. COMPARISON:  None Available. FINDINGS: CT HEAD FINDINGS Brain: No evidence of acute infarction, hemorrhage, hydrocephalus, extra-axial collection or mass lesion/mass effect. Vascular: No hyperdense vessel or unexpected calcification. Skull: Normal. Negative for fracture or focal lesion. Sinuses/Orbits: No acute finding. Other: None. CT CERVICAL SPINE FINDINGS Alignment: Normal. Skull base and vertebrae: Mildly displaced anterior corner type fracture of the body of C2 (series 5, image 30). No primary bone lesion or focal pathologic process. Soft tissues and spinal canal: No  prevertebral fluid or swelling. No visible canal hematoma. Disc levels: Mild multilevel disc space height loss and osteophytosis. Upper chest: Negative. Other: None. IMPRESSION: 1. No acute intracranial pathology. 2. Mildly displaced anterior corner type fracture of the body of C2. No other fracture of the cervical spine. These results were called by telephone at the time of interpretation on 09/22/2021 at 3:49 pm to [facility] , who verbally acknowledged these results. Electronically Signed   By: Delanna Ahmadi M.D.   On: 09/22/2021 15:49     Scheduled Meds:  atorvastatin  20 mg Oral Daily   furosemide  40 mg Intravenous Q12H   metoprolol succinate  12.5 mg Oral Daily   mupirocin cream   Topical BID   sodium chloride flush  3 mL Intravenous Q12H   sodium chloride flush  3 mL Intravenous Q12H   spironolactone  12.5 mg Oral Daily   Warfarin - Pharmacist Dosing Inpatient   Does not apply q1600   Continuous Infusions:  sodium chloride       LOS: 2 days   Time spent: [place], MD [facility] To contact the attending provider between 7A-7P or the covering provider during after hours 7P-7A, please log into the web site [email or web address] and access using universal [place] password for that web site. If you do not have the password, please call the hospital operator.  09/24/2021, 11:47 AM

## 2021-09-24 NOTE — TOC Progression Note (Signed)
Transition of Care [facility]) - Progression Note    Patient Details  Name: MELLISA ARSHAD MRN: [id number] Date of Birth: 1975-04-27  Transition of Care [facility]) CM/SW [place], RN Phone Number:[phone]  09/24/2021, 1:20 PM  Clinical Narrative:     Transition of Care (TOC) Screening Note   Patient Details  Name: JHANA GIARRATANO Date of Birth: Sep 30, 1975   Transition of Care [facility]) CM/SW Contact:    Angelita Ingles, RN Phone Number: 09/24/2021, 1:20 PM    Transition of Care Department (TOC) has reviewed patient and no TOC needs have been identified at this time. We will continue to monitor patient advancement through interdisciplinary progression rounds.           Expected Discharge Plan and Services                                                 Social Determinants of Health (SDOH) Interventions    Readmission Risk Interventions     No data to display

## 2021-09-24 NOTE — Progress Notes (Signed)
RT placed patient on CPAP HS. 2L O2 bleed in needed. Patient tolerating well at this time. 

## 2021-09-25 ENCOUNTER — Telehealth: Payer: Self-pay | Admitting: Pulmonary Disease

## 2021-09-25 DIAGNOSIS — S12191A Other nondisplaced fracture of second cervical vertebra, initial encounter for closed fracture: Secondary | ICD-10-CM | POA: Diagnosis not present

## 2021-09-25 LAB — COMPREHENSIVE METABOLIC PANEL
ALT: 70 U/L — ABNORMAL HIGH (ref 0–44)
AST: 49 U/L — ABNORMAL HIGH (ref 15–41)
Albumin: 3 g/dL — ABNORMAL LOW (ref 3.5–5.0)
Alkaline Phosphatase: 43 U/L (ref 38–126)
Anion gap: 6 (ref 5–15)
BUN: 18 mg/dL (ref 6–20)
CO2: 28 mmol/L (ref 22–32)
Calcium: 8.7 mg/dL — ABNORMAL LOW (ref 8.9–10.3)
Chloride: 100 mmol/L (ref 98–111)
Creatinine, Ser: 1.08 mg/dL — ABNORMAL HIGH (ref 0.44–1.00)
GFR, Estimated: >60 mL/min (ref >60)
Glucose, Bld: 97 mg/dL (ref 70–99)
Potassium: 3.7 mmol/L (ref 3.5–5.1)
Sodium: 134 mmol/L — ABNORMAL LOW (ref 135–145)
Total Bilirubin: 0.7 mg/dL (ref 0.3–1.2)
Total Protein: 6.4 g/dL — ABNORMAL LOW (ref 6.5–8.1)

## 2021-09-25 LAB — CBC
HCT: 29.2 % — ABNORMAL LOW (ref 36.0–46.0)
Hemoglobin: 8 g/dL — ABNORMAL LOW (ref 12.0–15.0)
MCH: 18.5 pg — ABNORMAL LOW (ref 26.0–34.0)
MCHC: 27.4 g/dL — ABNORMAL LOW (ref 30.0–36.0)
MCV: 67.4 fL — ABNORMAL LOW (ref 80.0–100.0)
Platelets: 227 10*3/uL (ref 150–400)
RBC: 4.33 MIL/uL (ref 3.87–5.11)
RDW: 21.3 % — ABNORMAL HIGH (ref 11.5–15.5)
WBC: 4.6 10*3/uL (ref 4.0–10.5)
nRBC: 1.3 % — ABNORMAL HIGH (ref 0.0–0.2)

## 2021-09-25 LAB — MAGNESIUM: Magnesium: 1.8 mg/dL (ref 1.7–2.4)

## 2021-09-25 LAB — PROTIME-INR
INR: 2.7 — ABNORMAL HIGH (ref 0.8–1.2)
Prothrombin Time: 28.1 seconds — ABNORMAL HIGH (ref 11.4–15.2)

## 2021-09-25 MED ORDER — ACETAMINOPHEN 325 MG PO TABS: 650.0000 mg | ORAL_TABLET | Freq: Four times a day (QID) | ORAL | Status: DC | PRN

## 2021-09-25 MED ORDER — TRAMADOL HCL 50 MG PO TABS: 100.0000 mg | ORAL_TABLET | Freq: Four times a day (QID) | ORAL | 0 refills | Status: DC | PRN

## 2021-09-25 MED ORDER — WARFARIN SODIUM 10 MG PO TABS
10.0000 mg | ORAL_TABLET | Freq: Once | ORAL | Status: DC
  Filled 2021-09-25: qty 1

## 2021-09-25 MED ORDER — WARFARIN SODIUM 5 MG PO TABS: ORAL_TABLET | ORAL | Status: DC

## 2021-09-25 MED ORDER — METOPROLOL SUCCINATE ER 25 MG PO TB24: 25.0000 mg | ORAL_TABLET | Freq: Every day | ORAL | 3 refills | Status: DC

## 2021-09-25 NOTE — Plan of Care (Signed)
  Problem: Education: Goal: Knowledge of General Education information will improve Description Including pain rating scale, medication(s)/side effects and non-pharmacologic comfort measures Outcome: Progressing   Problem: Health Behavior/Discharge Planning: Goal: Ability to manage health-related needs will improve Outcome: Progressing   

## 2021-09-25 NOTE — Progress Notes (Signed)
Mobility Specialist Progress Note   09/25/21 1250  Mobility  Activity Ambulated independently in hallway  Level of Assistance Standby assist, set-up cues, supervision of patient - no hands on  Assistive Device None  Distance Ambulated (ft) 420 ft  Activity Response Tolerated well  $Mobility charge 1 Mobility   Received pt in bed having no complaints and agreeable to mobility. Pt was asymptomatic throughout ambulation and returned to room w/o fault. Left back at EOB w/ call bell in reach and all needs met.   Holland Falling Mobility Specialist MS [facility] #:  [phone] Acute Rehab Office:  [phone]

## 2021-09-25 NOTE — Progress Notes (Signed)
ANTICOAGULATION CONSULT NOTE  Pharmacy Consult for coumadin Indication: h/o pulmonary embolus  Allergies  Allergen Reactions   Sulfa Antibiotics Itching, Swelling and Rash    Lips swelling    Patient Measurements: Height: '5\' 10"'$  (177.8 cm) Weight: 122.5 kg (270 lb 1 oz) IBW/kg (Calculated) : 68.5  Vital Signs: Temp: 97.6 F (36.4 C) (09/05 0709) Temp Source: Oral (09/05 0709) BP: 109/70 (09/05 0709) Pulse Rate: 59 (09/05 0709)  Labs: Recent Labs    09/22/21 1543 09/23/21 0112 09/24/21 0116 09/25/21 0142  HGB 7.6* 8.2* 7.5* 8.0*  HCT 28.5* 29.6* 28.3* 29.2*  PLT 325 286 248 227  LABPROT 44.0* 48.7* 36.8* 28.1*  INR 4.7* 5.4* 3.8* 2.7*  CREATININE 1.13* 1.13* 1.25* 1.08*  TROPONINIHS 40* 46*  --   --      Estimated Creatinine Clearance: 94.5 mL/min (A) (by C-G formula based on SCr of 1.08 mg/dL (H)).   Assessment: 46 y.o. F admitted with dizziness. Pt on warfarin PTA for h/o saddle PE 2015. Admission INR 4.7 (supratherapeutic). Hgb 7.6 on admission -s/p PRBC x1 9/2.  Home dose: 10 daily except for '15mg'$  on Tues and Thur. Last dose 9/1. Noted last check 6/14 and INR was 4. Plan at [facility] visit was to hold x 1 day and decrease dose to '10mg'$  daily but has been confirmed with pt that she has been taking '10mg'$  daily except for '15mg'$  on Tues and Thur.  INR continues to trend down, now at 2.7 this morning which is therapeutic. No warfarin has been given this admission. CBC stable at 8 and platelets 227. No reports of bleeding noted.   Goal of Therapy:  INR 2-3 Monitor platelets by anticoagulation protocol: Yes   Plan:  Warfarin '10mg'$  PO once tonight  Daily INR  Erskine Speed, PharmD Clinical Pharmacist 09/25/2021 10:59 AM

## 2021-09-25 NOTE — Progress Notes (Signed)
Discharge instructions, RX's and follow up appts explained and provided to patient verbalized understanding. Patient left floor via wheelchair accompanied by staff no c/o pain or shortness of breath.   Pearse Shiffler, Tivis Ringer, RN

## 2021-09-25 NOTE — Care Management Important Message (Signed)
Important Message  Patient Details  Name: [name] [name] MRN: [id number] Date of Birth: 1975-10-12   Medicare Important Message Given:  Yes     Hannah Beat 09/25/2021, 2:41 PM

## 2021-09-25 NOTE — Plan of Care (Signed)
  Problem: Education: Goal: Knowledge of General Education information will improve Description: Including pain rating scale, medication(s)/side effects and non-pharmacologic comfort measures 09/25/2021 1554 by Emmaline Life, RN Outcome: Adequate for Discharge 09/25/2021 0829 by Emmaline Life, RN Outcome: Progressing   Problem: Health Behavior/Discharge Planning: Goal: Ability to manage health-related needs will improve 09/25/2021 1554 by Emmaline Life, RN Outcome: Adequate for Discharge 09/25/2021 0829 by Emmaline Life, RN Outcome: Progressing   Problem: Clinical Measurements: Goal: Ability to maintain clinical measurements within normal limits will improve Outcome: Adequate for Discharge Goal: Will remain free from infection Outcome: Adequate for Discharge Goal: Diagnostic test results will improve Outcome: Adequate for Discharge Goal: Respiratory complications will improve Outcome: Adequate for Discharge Goal: Cardiovascular complication will be avoided Outcome: Adequate for Discharge   Problem: Activity: Goal: Risk for activity intolerance will decrease Outcome: Adequate for Discharge   Problem: Nutrition: Goal: Adequate nutrition will be maintained Outcome: Adequate for Discharge   Problem: Coping: Goal: Level of anxiety will decrease Outcome: Adequate for Discharge   Problem: Elimination: Goal: Will not experience complications related to bowel motility Outcome: Adequate for Discharge Goal: Will not experience complications related to urinary retention Outcome: Adequate for Discharge   Problem: Pain Managment: Goal: General experience of comfort will improve Outcome: Adequate for Discharge   Problem: Safety: Goal: Ability to remain free from injury will improve Outcome: Adequate for Discharge   Problem: Skin Integrity: Goal: Risk for impaired skin integrity will decrease Outcome: Adequate for Discharge

## 2021-09-25 NOTE — Discharge Summary (Signed)
Physician Discharge Summary  [name] [name] TML:[id number] DOB: 21-Oct-1975 DOA: 09/22/2021  PCP: Carrolyn Meiers, MD  Admit date: 09/22/2021 Discharge date: 09/25/2021  Time spent: 36 minutes  Recommendations for Outpatient Follow-up:  Event monitor to be placed on discharge-communicated with Birdie Sons, patient to follow-up in the outpatient setting with cardiology and I have CCed Dr. Harl Bowie Needs outpatient follow-up with Dr. Trenton Gammon of neurosurgery in about 1 month to 5 weeks for follow-up of neck wound and fracture and wear [place] brace at all times Coumadin dose cut back this admission needs INR in about 1 week and CBC Chem-12 in 2 weeks Pain control tramadol written for patient and increased metoprolol XL this admission to 25, also Coumadin cut back  Discharge Diagnoses:  MAIN problem for hospitalization   C2 corner neck fracture  Please see below for itemized issues addressed in [place]- refer to other progress notes for clarity if needed  Discharge Condition: Improved  Diet recommendation: Heart healthy  Filed Weights   09/22/21 1440 09/23/21 0353  Weight: 122.5 kg 122.5 kg    History of present illness:  46 year old black female known history of DVT PE 2015 IVC filter prior on Xarelto --- recurrence DVT/PE January 2023 status post thrombectomy on lifelong anticoagulation with Coumadin secondary to weight >250 pounds OSA on CPAP Known uterine fibroids Smoker Admission 06/05/2021 community-acquired pneumonia-- that admission was intubated-- HFrEF with acute exacerbation Echo 05/2021 --45-50%--diuresed aggressively 8 L cardiac cath showed nonobstructive disease discharged on Lasix Aldactone metoprolol and a Lovenox bridge as well as a dysphagia diet   Patient felt dizzy while trying to get into the truck-her own report states that she felt tired and nauseous-probably might of vagal passed out landing on the ground face first -hyperextended CT C-spine showed mildly  displaced anterior corner type fracture body of C2 Neurosurgery consulted not recommending any operative management Hemoglobin 7 BUN/creatinine 19/1.1 sodium 131 WBC 5.8 INR 4.7   Because of EKG changes cardiology was curb sided recommending echocardiogram  Hospital Course:  Syncope Likely vasovagal-keep on monitors-check for occult arrhythmia with Zio patch in outpatient setting given prior history of low EF I have communicated with [name] of cardiology who typically arranges these things -Because of some tachyarrhythmias which were nonsustained with 12 beats of V. tach we increase Toprol-XL to 25 mg Troponin elevation nonconcerning did not think it was a cardiac event trend is flat EKG however does show ST-T wave depressions in V2  -EKG repeat normalized--echo performed showed no specific abnormality but a drop in EF which could risk/cause tachyarrhythmia   Corner type fracture body of C2 EDP discussed with Dr. Trenton Gammon of neurosurgery who recommended 6-week follow-up , keep in [place] home on limited prescription of tramadol 100 every 6   HFrEF-compensated & Chronic Seems euvolemic continue Lasix 80 daily, Toprol as above, Aldactone 12.5 daily   Current DVT PE 2015/2023 status post thrombectomy prior IVC filter INR  trending down Coumadin to be managed by pharmacy -cannot use DOAC because weight is above 250 pounds--hemoglobin relatively stable repeat labs a.m.   Mild hyponatremia Monitor-improved from admission  Procedures:     ECHOCARDIOGRAM REPORT       Patient Name:   [name] [name] Date of Exam: 09/24/2021  Medical Rec #:  [id number]       Height:       70.0 in  Accession #:    [id number]      Weight:       270.1  lb  Date of Birth:  29-Oct-1975       BSA:          2.372 m  Patient Age:    46 years        BP:           122/70 mmHg  Patient Gender: F               HR:           65 bpm.  Exam Location:  Inpatient   Procedure: 2D Echo, Cardiac Doppler and Color  Doppler   Indications:    Syncope    History:        Patient has prior history of Echocardiogram examinations,  most                 recent 08/23/2021. CHF, CAD, Pulmonary HTN; Risk  Factors:Current                 Smoker. IVC filter. Hx DVT.    Sonographer:    Clayton Lefort RDCS (AE)  Referring Phys: [phone] [place]     1. Left ventricular ejection fraction, by estimation, is 30 to 35%. The  left ventricle has moderately decreased function. Left ventricular  endocardial border not optimally defined to evaluate regional wall motion,  global hypokinesis seen. The left  ventricular internal cavity size was moderately dilated. There is mild  left ventricular hypertrophy. Left ventricular diastolic parameters are  indeterminate.   2. Right ventricular systolic function is mildly reduced. The right  ventricular size is mildly enlarged. There is mildly elevated pulmonary  artery systolic pressure. The estimated right ventricular systolic  pressure is 12.8 mmHg.   3. Left atrial size was moderately dilated.   4. Right atrial size was mildly dilated.   5. MR mechanism appears to be posterior leaflet restriction with relative  anterior leaflet override with posteriorly directed jet of MR. Review of  serial studies suggests regional wall motion abnormalities contribute to  posterior leaflet restriction. No   pulmonary vein reversal seen on this study, systolic PV blunting.. The  mitral valve is grossly normal. Moderate to severe mitral valve  regurgitation. No evidence of mitral stenosis.   6. Tricuspid valve regurgitation is mild to moderate.   7. The aortic valve is tricuspid. Aortic valve regurgitation is not  visualized. No aortic stenosis is present.   8. The inferior vena cava is dilated in size with <50% respiratory  variability, suggesting right atrial pressure of 15 mmHg.     Consultations: Telephone consulted Dr. Marlou Porch of cardiology  Discharge  Exam: Vitals:   09/25/21 0709 09/25/21 1107  BP: 109/70 107/64  Pulse: (!) 59 60  Resp: 20 19  Temp: 97.6 F (36.4 C) 98 F (36.7 C)  SpO2: 99% 100%    Subj on day of d/c   Awake coherent pain is moderate She seems comfortable no chest pain  General Exam on discharge  EOMI NCAT no focal deficit In [place] brace-bruised to left forehead ROM intact moving 4 limbs and ambulatory Chest clear no rales  Discharge Instructions   Discharge Instructions     Diet - low sodium heart healthy   Complete by: As directed    Discharge instructions   Complete by: As directed    You will notice that you have been given tramadol for pain control which is for severe pain can take Tylenol for moderate pain Please cut back your dose of  Coumadin to 5 mg daily Note that you are blood pressure medicine metoprolol XL has been increased to 25 daily  We felt that he might of had a severe vagal event (the vagus is a nerve that slows down the heart) after you had nausea which is what caused you to fall-to ensure that you have not had further funny heart rhythms that could have caused you to faint and rule that out we will get an event monitor  You need to follow-up with neurosurgery Dr. Trenton Gammon in his office in about 6 weeks for x-rays and call him to ensure that you get follow-up for your neck wound and keep the brace on at all times until then   Increase activity slowly   Complete by: As directed       Allergies as of 09/25/2021       Reactions   Sulfa Antibiotics Itching, Swelling, Rash   Lips swelling        Medication List     TAKE these medications    acetaminophen 325 MG tablet Commonly known as: TYLENOL Take 2 tablets (650 mg total) by mouth every 6 (six) hours as needed for mild pain (or Fever >/= 101).   albuterol 108 (90 Base) MCG/ACT inhaler Commonly known as: VENTOLIN HFA Inhale 2 puffs into the lungs every 2 (two) hours as needed for wheezing or shortness of breath.    atorvastatin 20 MG tablet Commonly known as: LIPITOR Take 1 tablet (20 mg total) by mouth daily.   furosemide 80 MG tablet Commonly known as: LASIX Take 1 tablet (80 mg total) by mouth daily.   metoprolol succinate 25 MG 24 hr tablet Commonly known as: TOPROL-XL Take 1 tablet (25 mg total) by mouth daily. Start taking on: September 26, 2021 What changed: how much to take   spironolactone 25 MG tablet Commonly known as: ALDACTONE Take 0.5 tablets (12.5 mg total) by mouth daily.   traMADol 50 MG tablet Commonly known as: ULTRAM Take 2 tablets (100 mg total) by mouth every 6 (six) hours as needed for moderate pain.   warfarin 5 MG tablet Commonly known as: COUMADIN Take as directed. If you are unsure how to take this medication, talk to your nurse or doctor. Original instructions: TAKE (1) OR (1) 1/2 TABLET BY MOUTH DAILY AS DIRECTED. What changed: medication strength       Allergies  Allergen Reactions   Sulfa Antibiotics Itching, Swelling and Rash    Lips swelling    Follow-up Information     Earnie Larsson, MD. Schedule an appointment as soon as possible for a visit in 2 week(s).   Specialty: Neurosurgery Why: Maintain hard collar at all times until seen in follow up. Contact information: [address] [place] [address] [phone]                  The results of significant diagnostics from this hospitalization (including imaging, microbiology, ancillary and laboratory) are listed below for reference.    Significant Diagnostic Studies: ECHOCARDIOGRAM COMPLETE  Result Date: 09/24/2021    ECHOCARDIOGRAM REPORT   Patient Name:   ANAMAE ROCHELLE Date of Exam: 09/24/2021 Medical Rec #:  [id number]       Height:       70.0 in Accession #:    [id number]      Weight:       270.1 lb Date of Birth:  1975-02-13       BSA:  2.372 m Patient Age:    78 years        BP:           122/70 mmHg Patient Gender: F               HR:           65 bpm.  Exam Location:  Inpatient Procedure: 2D Echo, Cardiac Doppler and Color Doppler Indications:    Syncope  History:        Patient has prior history of Echocardiogram examinations, most                 recent 08/23/2021. CHF, CAD, Pulmonary HTN; Risk Factors:Current                 Smoker. IVC filter. Hx DVT.  Sonographer:    Clayton Lefort RDCS (AE) Referring Phys: [phone] [place]  1. Left ventricular ejection fraction, by estimation, is 30 to 35%. The left ventricle has moderately decreased function. Left ventricular endocardial border not optimally defined to evaluate regional wall motion, global hypokinesis seen. The left ventricular internal cavity size was moderately dilated. There is mild left ventricular hypertrophy. Left ventricular diastolic parameters are indeterminate.  2. Right ventricular systolic function is mildly reduced. The right ventricular size is mildly enlarged. There is mildly elevated pulmonary artery systolic pressure. The estimated right ventricular systolic pressure is 73.2 mmHg.  3. Left atrial size was moderately dilated.  4. Right atrial size was mildly dilated.  5. MR mechanism appears to be posterior leaflet restriction with relative anterior leaflet override with posteriorly directed jet of MR. Review of serial studies suggests regional wall motion abnormalities contribute to posterior leaflet restriction. No  pulmonary vein reversal seen on this study, systolic PV blunting.. The mitral valve is grossly normal. Moderate to severe mitral valve regurgitation. No evidence of mitral stenosis.  6. Tricuspid valve regurgitation is mild to moderate.  7. The aortic valve is tricuspid. Aortic valve regurgitation is not visualized. No aortic stenosis is present.  8. The inferior vena cava is dilated in size with <50% respiratory variability, suggesting right atrial pressure of 15 mmHg. FINDINGS  Left Ventricle: Left ventricular ejection fraction, by estimation, is 30 to 35%. The  left ventricle has moderately decreased function. Left ventricular endocardial border not optimally defined to evaluate regional wall motion. The left ventricular internal cavity size was moderately dilated. There is mild left ventricular hypertrophy. Left ventricular diastolic parameters are indeterminate. Right Ventricle: The right ventricular size is mildly enlarged. No increase in right ventricular wall thickness. Right ventricular systolic function is mildly reduced. There is mildly elevated pulmonary artery systolic pressure. The tricuspid regurgitant  velocity is 2.31 m/s, and with an assumed right atrial pressure of 15 mmHg, the estimated right ventricular systolic pressure is 20.2 mmHg. Left Atrium: Left atrial size was moderately dilated. Right Atrium: Right atrial size was mildly dilated. Pericardium: Trivial pericardial effusion is present. Mitral Valve: MR mechanism appears to be posterior leaflet restriction with relative anterior leaflet override with posteriorly directed jet of MR. Review of serial studies suggests regional wall motion abnormalities contribute to posterior leaflet restriction. No pulmonary vein reversal seen on this study, systolic PV blunting. The mitral valve is grossly normal. Mild mitral annular calcification. Moderate to severe mitral valve regurgitation. No evidence of mitral valve stenosis. MV peak gradient, 7.6 mmHg. The mean mitral valve gradient is 2.0 mmHg. Tricuspid Valve: The tricuspid valve is normal in structure. Tricuspid valve regurgitation is mild  to moderate. No evidence of tricuspid stenosis. Aortic Valve: The aortic valve is tricuspid. Aortic valve regurgitation is not visualized. No aortic stenosis is present. Aortic valve mean gradient measures 3.0 mmHg. Aortic valve peak gradient measures 6.6 mmHg. Aortic valve area, by VTI measures 2.41 cm. Pulmonic Valve: The pulmonic valve was normal in structure. Pulmonic valve regurgitation is trivial. No evidence of  pulmonic stenosis. Aorta: The aortic root is normal in size and structure. Venous: The inferior vena cava is dilated in size with less than 50% respiratory variability, suggesting right atrial pressure of 15 mmHg. IAS/Shunts: No atrial level shunt detected by color flow Doppler.  LEFT VENTRICLE PLAX 2D LVIDd:         6.20 cm      Diastology LVIDs:         5.20 cm      LV e' medial:    6.42 cm/s LV PW:         1.10 cm      LV E/e' medial:  21.0 LV IVS:        1.10 cm      LV e' lateral:   9.57 cm/s LVOT diam:     2.40 cm      LV E/e' lateral: 14.1 LV SV:         55 LV SV Index:   23 LVOT Area:     4.52 cm  LV Volumes (MOD) LV vol d, MOD A2C: 250.0 ml LV vol d, MOD A4C: 226.0 ml LV vol s, MOD A2C: 185.0 ml LV vol s, MOD A4C: 160.0 ml LV SV MOD A2C:     65.0 ml LV SV MOD A4C:     226.0 ml LV SV MOD BP:      73.7 ml RIGHT VENTRICLE            IVC RV Basal diam:  3.40 cm    IVC diam: 2.70 cm RV S prime:     8.49 cm/s TAPSE (M-mode): 1.8 cm LEFT ATRIUM              Index        RIGHT ATRIUM           Index LA diam:        4.10 cm  1.73 cm/m   RA Area:     19.50 cm LA Vol (A2C):   102.0 ml 43.01 ml/m  RA Volume:   58.30 ml  24.58 ml/m LA Vol (A4C):   114.0 ml 48.07 ml/m LA Biplane Vol: 106.0 ml 44.70 ml/m  AORTIC VALVE AV Area (Vmax):    2.42 cm AV Area (Vmean):   2.26 cm AV Area (VTI):     2.41 cm AV Vmax:           128.00 cm/s AV Vmean:          86.000 cm/s AV VTI:            0.229 m AV Peak Grad:      6.6 mmHg AV Mean Grad:      3.0 mmHg LVOT Vmax:         68.60 cm/s LVOT Vmean:        43.000 cm/s LVOT VTI:          0.122 m LVOT/AV VTI ratio: 0.53  AORTA Ao Root diam: 3.00 cm Ao Asc diam:  2.80 cm MITRAL VALVE  TRICUSPID VALVE MV Area (PHT): 4.19 cm       TR Peak grad:   21.3 mmHg MV Area VTI:   1.43 cm       TR Vmax:        231.00 cm/s MV Peak grad:  7.6 mmHg MV Mean grad:  2.0 mmHg       SHUNTS MV Vmax:       1.38 m/s       Systemic VTI:  0.12 m MV Vmean:      68.5 cm/s      Systemic Diam:  2.40 cm MV Decel Time: 181 msec MR Peak grad:    56.9 mmHg MR Mean grad:    36.0 mmHg MR Vmax:         377.00 cm/s MR Vmean:        280.0 cm/s MR PISA:         3.08 cm MR PISA Eff ROA: 32 mm MR PISA Radius:  0.70 cm MV E velocity: 135.00 cm/s Cherlynn Kaiser MD Electronically signed by Cherlynn Kaiser MD Signature Date/Time: 09/24/2021/2:15:24 PM    Final    CT Head Wo Contrast  Result Date: 09/22/2021 CLINICAL DATA:  Syncope yesterday, fall EXAM: CT HEAD WITHOUT CONTRAST CT CERVICAL SPINE WITHOUT CONTRAST TECHNIQUE: Multidetector CT imaging of the head and cervical spine was performed following the standard protocol without intravenous contrast. Multiplanar CT image reconstructions of the cervical spine were also generated. RADIATION DOSE REDUCTION: This exam was performed according to the departmental dose-optimization program which includes automated exposure control, adjustment of the mA and/or kV according to patient size and/or use of iterative reconstruction technique. COMPARISON:  None Available. FINDINGS: CT HEAD FINDINGS Brain: No evidence of acute infarction, hemorrhage, hydrocephalus, extra-axial collection or mass lesion/mass effect. Vascular: No hyperdense vessel or unexpected calcification. Skull: Normal. Negative for fracture or focal lesion. Sinuses/Orbits: No acute finding. Other: None. CT CERVICAL SPINE FINDINGS Alignment: Normal. Skull base and vertebrae: Mildly displaced anterior corner type fracture of the body of C2 (series 5, image 30). No primary bone lesion or focal pathologic process. Soft tissues and spinal canal: No prevertebral fluid or swelling. No visible canal hematoma. Disc levels: Mild multilevel disc space height loss and osteophytosis. Upper chest: Negative. Other: None. IMPRESSION: 1. No acute intracranial pathology. 2. Mildly displaced anterior corner type fracture of the body of C2. No other fracture of the cervical spine. These results were called by telephone at the time  of interpretation on 09/22/2021 at 3:49 pm to [facility] , who verbally acknowledged these results. Electronically Signed   By: Delanna Ahmadi M.D.   On: 09/22/2021 15:49   CT Cervical Spine Wo Contrast  Result Date: 09/22/2021 CLINICAL DATA:  Syncope yesterday, fall EXAM: CT HEAD WITHOUT CONTRAST CT CERVICAL SPINE WITHOUT CONTRAST TECHNIQUE: Multidetector CT imaging of the head and cervical spine was performed following the standard protocol without intravenous contrast. Multiplanar CT image reconstructions of the cervical spine were also generated. RADIATION DOSE REDUCTION: This exam was performed according to the departmental dose-optimization program which includes automated exposure control, adjustment of the mA and/or kV according to patient size and/or use of iterative reconstruction technique. COMPARISON:  None Available. FINDINGS: CT HEAD FINDINGS Brain: No evidence of acute infarction, hemorrhage, hydrocephalus, extra-axial collection or mass lesion/mass effect. Vascular: No hyperdense vessel or unexpected calcification. Skull: Normal. Negative for fracture or focal lesion. Sinuses/Orbits: No acute finding. Other: None. CT CERVICAL SPINE FINDINGS Alignment: Normal. Skull base and vertebrae: Mildly displaced  anterior corner type fracture of the body of C2 (series 5, image 30). No primary bone lesion or focal pathologic process. Soft tissues and spinal canal: No prevertebral fluid or swelling. No visible canal hematoma. Disc levels: Mild multilevel disc space height loss and osteophytosis. Upper chest: Negative. Other: None. IMPRESSION: 1. No acute intracranial pathology. 2. Mildly displaced anterior corner type fracture of the body of C2. No other fracture of the cervical spine. These results were called by telephone at the time of interpretation on 09/22/2021 at 3:49 pm to [facility] , who verbally acknowledged these results. Electronically Signed   By: Delanna Ahmadi M.D.   On: 09/22/2021 15:49    DG Chest 2 View  Result Date: 09/14/2021 CLINICAL DATA:  Chest pain, shortness of breath, hypertension, dyspnea on exertion EXAM: CHEST - 2 VIEW COMPARISON:  06/15/2021 FINDINGS: Similar cardiac enlargement with central vascular congestion. No definite acute edema pattern or CHF. No large effusion or pneumothorax. No focal pneumonia. Trachea midline. Degenerative changes of the spine. IMPRESSION: Cardiomegaly with vascular congestion. Electronically Signed   By: Jerilynn Mages.  Shick M.D.   On: 09/14/2021 16:07    Microbiology: No results found for this or any previous visit (from the past 240 hour(s)).   Labs: Basic Metabolic Panel: Recent Labs  Lab 09/22/21 1543 09/23/21 0112 09/24/21 0116 09/25/21 0142  NA 131* 132* 134* 134*  K 3.5 3.6 3.7 3.7  CL 98 102 99 100  CO2 '24 23 28 28  '$ GLUCOSE 91 108* 90 97  BUN '19 16 17 18  '$ CREATININE 1.13* 1.13* 1.25* 1.08*  CALCIUM 8.7* 8.6* 8.6* 8.7*  MG  --  1.8  --  1.8   Liver Function Tests: Recent Labs  Lab 09/22/21 1543 09/24/21 0116 09/25/21 0142  AST 59* 55* 49*  ALT 62* 72* 70*  ALKPHOS 47 38 43  BILITOT 1.1 0.8 0.7  PROT 7.1 6.3* 6.4*  ALBUMIN 3.3* 2.9* 3.0*   No results for input(s): "LIPASE", "AMYLASE" in the last 168 hours. No results for input(s): "AMMONIA" in the last 168 hours. CBC: Recent Labs  Lab 09/22/21 1543 09/23/21 0112 09/24/21 0116 09/25/21 0142  WBC 5.8 6.0 5.3 4.6  NEUTROABS 3.6  --   --   --   HGB 7.6* 8.2* 7.5* 8.0*  HCT 28.5* 29.6* 28.3* 29.2*  MCV 66.1* 65.8* 67.5* 67.4*  PLT 325 286 248 227   Cardiac Enzymes: No results for input(s): "CKTOTAL", "CKMB", "CKMBINDEX", "TROPONINI" in the last 168 hours. BNP: BNP (last 3 results) Recent Labs    06/04/21 2321 06/12/21 1409 09/22/21 1543  BNP 537.0* 293.6* 1,362.0*    ProBNP (last 3 results) No results for input(s): "PROBNP" in the last 8760 hours.  CBG: No results for input(s): "GLUCAP" in the last 168 hours.     Signed:  Nita Sells MD   [facility] 09/25/2021, 11:20 AM

## 2021-09-25 NOTE — Telephone Encounter (Signed)
[facility] is out of network with [employer] for pt's CPAP.  Jasmine states [facility] in [place] is in network for order to be sent for CPAP machine.  Ph:  [phone]. Please advise.   Nunzio Cobbs, can we send this to [facility]?

## 2021-09-26 ENCOUNTER — Encounter (HOSPITAL_COMMUNITY): Payer: Self-pay | Admitting: Hematology

## 2021-09-26 ENCOUNTER — Other Ambulatory Visit: Payer: Self-pay | Admitting: Cardiology

## 2021-09-26 DIAGNOSIS — R55 Syncope and collapse: Secondary | ICD-10-CM

## 2021-10-02 ENCOUNTER — Ambulatory Visit: Payer: Medicare HMO | Attending: Cardiology

## 2021-10-02 DIAGNOSIS — R55 Syncope and collapse: Secondary | ICD-10-CM

## 2021-10-03 ENCOUNTER — Encounter: Payer: Self-pay | Admitting: Cardiology

## 2021-10-03 DIAGNOSIS — R55 Syncope and collapse: Secondary | ICD-10-CM | POA: Diagnosis not present

## 2021-10-04 ENCOUNTER — Telehealth: Payer: Self-pay | Admitting: *Deleted

## 2021-10-04 NOTE — Telephone Encounter (Signed)
Cardiac Monitor Alert   Date of alert:  10/03/2021 '@8'$ :54 pm   Patient Name: [name] [name] DOB: [birth year] MRN: [id number]   [facility] Cardiologist: Carlyle D[facility] EP:  none   Monitor Information: 30 Day [employer] BodyGuardian Mini PLUS - MCT Reason:  syncope Ordering provider: [place]    Alert Sinus Rhythm w/Run of V-Tach (9 Beats)/PVCs (4 In a min)   Next Cardiology Appointment   Date: 11/13/2021 '@1'$ :00 pm with Melina Copa Linna Hoff)  Other  Contacted patient regarding critical notification from [employer]. Denies having symptoms during time of report. Denies chest pain, dizziness, sob or palpitations. Medications reviewed.  Document along with report routed to [facility] for review.

## 2021-10-05 NOTE — Telephone Encounter (Signed)
Short run of nonsustained VT, similar to what was noted in the hospital during recent admission. Continue to monitor, no addiitional changes at this time  Zandra Abts MD

## 2021-10-08 DIAGNOSIS — M25511 Pain in right shoulder: Secondary | ICD-10-CM | POA: Diagnosis not present

## 2021-10-08 DIAGNOSIS — Z6841 Body Mass Index (BMI) 40.0 and over, adult: Secondary | ICD-10-CM | POA: Diagnosis not present

## 2021-10-08 DIAGNOSIS — S12190A Other displaced fracture of second cervical vertebra, initial encounter for closed fracture: Secondary | ICD-10-CM | POA: Diagnosis not present

## 2021-10-08 DIAGNOSIS — M25512 Pain in left shoulder: Secondary | ICD-10-CM | POA: Diagnosis not present

## 2021-10-08 DIAGNOSIS — M5411 Radiculopathy, occipito-atlanto-axial region: Secondary | ICD-10-CM | POA: Diagnosis not present

## 2021-10-15 ENCOUNTER — Telehealth: Payer: Self-pay | Admitting: Cardiology

## 2021-10-15 NOTE — Telephone Encounter (Signed)
Patient states she ran out of adhesive tabs for her heart monitor. She would like to know if there are any available at either office in [facility]. She is also interested in scheduling INR with Lattie Haw, but I am unable to schedule from the orders placed. Please assist.

## 2021-10-29 ENCOUNTER — Ambulatory Visit: Payer: Medicare HMO | Attending: Cardiology | Admitting: *Deleted

## 2021-10-29 DIAGNOSIS — Z5181 Encounter for therapeutic drug level monitoring: Secondary | ICD-10-CM | POA: Diagnosis not present

## 2021-10-29 DIAGNOSIS — I2602 Saddle embolus of pulmonary artery with acute cor pulmonale: Secondary | ICD-10-CM

## 2021-10-29 DIAGNOSIS — Z86711 Personal history of pulmonary embolism: Secondary | ICD-10-CM | POA: Diagnosis not present

## 2021-10-29 DIAGNOSIS — I2782 Chronic pulmonary embolism: Secondary | ICD-10-CM | POA: Diagnosis not present

## 2021-10-29 LAB — POCT INR: INR: 1.6 — AB (ref 2.0–3.0)

## 2021-10-29 NOTE — Patient Instructions (Signed)
9/5 D/C [facility] on warfarin '5mg'$  daily (1/2 of '10mg'$  tablet) Start warfarin 1/2 tablet daily except 1 tablet on Mondays, Wednesdays and Fridays Recheck in 2 wks

## 2021-11-12 ENCOUNTER — Encounter: Payer: Self-pay | Admitting: Physician Assistant

## 2021-11-12 ENCOUNTER — Ambulatory Visit: Payer: Medicare HMO | Attending: Cardiology | Admitting: *Deleted

## 2021-11-12 DIAGNOSIS — Z86711 Personal history of pulmonary embolism: Secondary | ICD-10-CM | POA: Diagnosis not present

## 2021-11-12 DIAGNOSIS — I2782 Chronic pulmonary embolism: Secondary | ICD-10-CM

## 2021-11-12 DIAGNOSIS — Z5181 Encounter for therapeutic drug level monitoring: Secondary | ICD-10-CM | POA: Diagnosis not present

## 2021-11-12 DIAGNOSIS — I2602 Saddle embolus of pulmonary artery with acute cor pulmonale: Secondary | ICD-10-CM

## 2021-11-12 LAB — POCT INR: INR: 1.7 — AB (ref 2.0–3.0)

## 2021-11-12 NOTE — Progress Notes (Signed)
Cardiology Office Note    Date:  11/13/2021   ID:  [name] [name], DOB August 13, 1975, MRN [id number]  PCP:  Carrolyn Meiers, MD  Cardiologist:  Carlyle Dolly, MD  Electrophysiologist:  None   Chief Complaint: f/u CHF, syncope  History of Present Illness:   [name] [name] is a 46 y.o. female with history of unprovoked saddle PE 05/2013, recurrent unprovoked DVT 01/2021 (had missed 2 dose of Coumadin), IVC filter placed in 2015, pulmonary hypertension, chronic combined CHF, NICM, moderate-severe MR by echo 09/2021, nonobstructive CAD by cath 05/2021, chronic anemia followed by hematology (hx of iron infusions), menorrhagia/leiomyoma, HTN, NSVT, hidradenitis (pt denies), OSA on CPAP, obesity, prior habitual ETOH use, tobacco use who is seen for follow-up.  She saw Dr. Harl Bowie in 2019 for evaluation of her prior PE in 2015 to get set up in our coumadin clinic. Per notes, hematology had recommended Coumadin over DOACs for her, not well studied in patients >250lbs. She reports she saw a specialist at [facility] at one point and their workup did not reveal a cause for her clotting. In 01/2021 she had a recurrent event with LLE DVT in setting of missing 2 doses of Coumadin. CTA was negative for PE. She was admitted in 05/2021 for CAP, CHF, and worsening anemia requiring 3 U PRBCs, with echo showing EF 45-50% requiring diuresis. She was readmitted later that month with respiratory failure requiring intubation felt likely combination of PNA + CHF She underwent R/L heart cath on 06/19/21 that showed 40-50% proximal right coronary narrowing, otherwise normal coronary arteries. Also showed significant elevation in LVEDP, consistent with acute on chronic combined systolic and diastolic heart failure. Soft bp's limited meds during admission. Repeat echo 08/23/21 showed EF down to 30-35%. She did not attend the follow-up visit after this. She was admitted 09/2021 with dizziness, nausea, and syncope with  subsequent C2 fracture. She was leaning over in her truck, felt nauseated, then family said she just completely went out and faceplanted down. Neurosurgery did not recommend any operative management. Repeat echo showed continued LV dysfunction with EF down to 30-35%, mild LVH, mildly reduced RVSF, mild RVE, mildly elevated PASP, moderate LAE, mild RAE, moderate-severe mitral regurgitation, mild-moderate tricuspid regurgitation. Syncope was felt vasovagal in etiology but OP monitor recommended. The full report is not read by MD yet but preliminary review shows NSR w/ occasional NSVT (max 9 beats), 4% PVCs <1% PACs.   She returns for follow-up today with her fiance. We discussed her prior history and monitor results in detail. She describes NYHA class 2-3 dyspnea which she reports is relatively chronic. No anginal type chest pain, edema, orthopnea or recurrent syncope. She used to drink 4 drinks a day but cut down to more rare use after she got sick earlier this year, denies illicit substances. Smokes 3-4 cigarettes a day. Her father and his sister both had dx of CHF though details aren't clear. No clear cut family hx of SCD or ICDs. She saw neurosurgery and was able to discontinue her collar.   Labwork independently reviewed: 09/2021 Na 134, Mg 1.8, K 3.7, Cr 1.08, albumin 3.0, AST/ALT mildly elevated, Hgb 8.0, plt 227 08/2021 LDL 49, trig 59   Cardiology Studies:   Studies reviewed are outlined and summarized above. Reports included below if pertinent.   2D echo 09/2021   1. Left ventricular ejection fraction, by estimation, is 30 to 35%. The  left ventricle has moderately decreased function. Left ventricular  endocardial border not  optimally defined to evaluate regional wall motion,  global hypokinesis seen. The left  ventricular internal cavity size was moderately dilated. There is mild  left ventricular hypertrophy. Left ventricular diastolic parameters are  indeterminate.   2. Right  ventricular systolic function is mildly reduced. The right  ventricular size is mildly enlarged. There is mildly elevated pulmonary  artery systolic pressure. The estimated right ventricular systolic  pressure is 60.7 mmHg.   3. Left atrial size was moderately dilated.   4. Right atrial size was mildly dilated.   5. MR mechanism appears to be posterior leaflet restriction with relative  anterior leaflet override with posteriorly directed jet of MR. Review of  serial studies suggests regional wall motion abnormalities contribute to  posterior leaflet restriction. No   pulmonary vein reversal seen on this study, systolic PV blunting.. The  mitral valve is grossly normal. Moderate to severe mitral valve  regurgitation. No evidence of mitral stenosis.   6. Tricuspid valve regurgitation is mild to moderate.   7. The aortic valve is tricuspid. Aortic valve regurgitation is not  visualized. No aortic stenosis is present.   8. The inferior vena cava is dilated in size with <50% respiratory  variability, suggesting right atrial pressure of 15 mmHg.   [facility] 05/2021     Past Medical History:  Diagnosis Date   Abdominal cramps 08/30/2015   Anemia    Blood transfusion without reported diagnosis    CAD (coronary artery disease)    Chronic combined systolic and diastolic CHF (congestive heart failure) (HCC)    Closed left ankle fracture    August 30 2012   Clotting disorder [facility])    DVT (deep venous thrombosis) (HCC)    L leg   Fibroid tumor    Hidradenitis    High blood pressure    Hypertension    Mitral regurgitation    NICM (nonischemic cardiomyopathy) (HCC)    NSVT (nonsustained ventricular tachycardia) (HCC)    Obesity    OSA on CPAP    Oxygen deficiency    Pregnancy    09/20/14 17 weeks   Pulmonary embolism ([place]) 04/2013   Sleep apnea    Tobacco use disorder     Past Surgical History:  Procedure Laterality Date   ABSCESS DRAINAGE Bilateral 01/10/15   COLONOSCOPY WITH  PROPOFOL N/A 06/08/2018   Procedure: COLONOSCOPY WITH PROPOFOL;  Surgeon: Daneil Dolin, MD;  Location[facility];  Service: Endoscopy;  Laterality: N/A;  2:30pm   CYST REMOVAL TRUNK     IVC FILTER PLACEMENT ([place] [facility])     LOWER EXTREMITY VENOGRAPHY N/A 02/07/2021   Procedure: LOWER EXTREMITY VENOGRAPHY;  Surgeon: [name], MD;  Location: [place] CV LAB;  Service: Cardiovascular;  Laterality: N/A;   PERIPHERAL VASCULAR THROMBECTOMY N/A 02/07/2021   Procedure: PERIPHERAL VASCULAR THROMBECTOMY;  Surgeon: [name], MD;  Location: [place] CV LAB;  Service: Cardiovascular;  Laterality: N/A;   RIGHT/LEFT HEART CATH AND CORONARY ANGIOGRAPHY N/A 06/19/2021   Procedure: RIGHT/LEFT HEART CATH AND CORONARY ANGIOGRAPHY;  Surgeon: Belva Crome, MD;  Location: [place] CV LAB;  Service: Cardiovascular;  Laterality: N/A;    Current Medications: Current Meds  Medication Sig   acetaminophen (TYLENOL) 325 MG tablet Take 2 tablets (650 mg total) by mouth every 6 (six) hours as needed for mild pain (or Fever >/= 101).   albuterol (VENTOLIN HFA) 108 (90 Base) MCG/ACT inhaler Inhale 2 puffs into the lungs every 2 (two) hours as needed  for wheezing or shortness of breath.   atorvastatin (LIPITOR) 20 MG tablet Take 1 tablet (20 mg total) by mouth daily.   furosemide (LASIX) 80 MG tablet Take 1 tablet (80 mg total) by mouth daily.   metoprolol succinate (TOPROL-XL) 25 MG 24 hr tablet Take 1 tablet (25 mg total) by mouth daily.   spironolactone (ALDACTONE) 25 MG tablet Take 0.5 tablets (12.5 mg total) by mouth daily.   warfarin (COUMADIN) 5 MG tablet TAKE (1) OR (1) 1/2 TABLET BY MOUTH DAILY AS DIRECTED.      Allergies:   Sulfa antibiotics   Social History   Socioeconomic History   Marital status: Widowed    Spouse name: Not on file   Number of children: Not on file   Years of education: Not on file   Highest education level: Not on file  Occupational History   Not on file   Tobacco Use   Smoking status: Some Days    Packs/day: 0.25    Years: 18.00    Total pack years: 4.50    Types: Cigarettes   Smokeless tobacco: Never   Tobacco comments:    smokes 3 cig a day ARJ 02/15/21  Vaping Use   Vaping Use: Never used  Substance and Sexual Activity   Alcohol use: Yes    Alcohol/week: 2.0 standard drinks of alcohol    Types: 2 Cans of beer per week    Comment: twice a month   Drug use: No   Sexual activity: Not Currently    Birth control/protection: Pill, None  Other Topics Concern   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    Has a 45 year old [place].   Live with parents.   Was working 5 days a weeks.   Not working right now.    Attends church.    Social Determinants of Health   Financial Resource Strain: Not on file  Food Insecurity: Not on file  Transportation Needs: Not on file  Physical Activity: Not on file  Stress: Not on file  Social Connections: Not on file     Family History:  The patient's family history includes Cancer in her maternal grandmother; Diabetes in her paternal aunt and paternal uncle; Heart attack in her paternal grandmother; Hypertension in her mother; Other in her father. There is no history of Colon cancer.  ROS:   Please see the history of present illness.  All other systems are reviewed and otherwise negative.    EKG(s)/Additional Labs   EKG:  EKG is not ordered today but reviewed recent tracing from the hospital.  Recent Labs: 09/22/2021: B Natriuretic Peptide 1,362.0 09/25/2021: ALT 70; BUN 18; Creatinine, Ser 1.08; Hemoglobin 8.0; Magnesium 1.8; Platelets 227; Potassium 3.7; Sodium 134  Recent Lipid Panel    Component Value Date/Time   CHOL 86 08/21/2021 1119   TRIG 59 08/21/2021 1119   HDL 25 (L) 08/21/2021 1119   CHOLHDL 3.4 08/21/2021 1119   VLDL 12 08/21/2021 1119   LDLCALC 49 08/21/2021 1119   LDLCALC 92 01/31/2017 1119    PHYSICAL EXAM:     VS:  BP 122/70   Pulse 76   Ht '5\' 10"'$  (1.778 m)   Wt 298 lb 3.2 oz (135.3 kg)   SpO2 96%   BMI 42.79 kg/m   BMI: Body mass index is 42.79 kg/m.  GEN: Well nourished, well developed female in no acute distress  HEENT: normocephalic, atraumatic Neck: no JVD, carotid bruits, or masses Cardiac: RRR; no murmurs, rubs, or gallops, no edema  Respiratory:  clear to auscultation bilaterally, normal work of breathing GI: soft, nontender, nondistended, + BS MS: no deformity or atrophy Skin: warm and dry, no rash Neuro:  Alert and Oriented x 3, Strength and sensation are intact, follows commands Psych: euthymic mood, full affect  Wt Readings from Last 3 Encounters:  11/13/21 298 lb 3.2 oz (135.3 kg)  09/23/21 270 lb 1 oz (122.5 kg)  09/13/21 272 lb 12.8 oz (123.7 kg)     ASSESSMENT & PLAN:   1. Syncope, hx NSVT - though felt vasovagal in the past, certainly concerning in the setting of her structural disease and NSVT/PVCs on her monitor. We'll check electrolytes and labs today. Likely plan to titrate metoprolol when we call her with results and med plan as outlined in #2. Will pursue cardiac MRI and refer to EP for input. Per d/w Dr. Harl Bowie, patient advised not to drive until she gets further evaluation/input from our electrophysiology team.  2. NICM/chronic combined CHF, essential HTN - I reviewed her situation with Dr. Harl Bowie. She was previously not on ACEI/ARB/ARNI due to soft BPs earlier this year which seem to have improved. She is tolerating Lasix '80mg'$  daily, Toprol '25mg'$  daily, spironolactone 12.'5mg'$  daily. Will check labs today to ensure stable. Per d/w MD, we'd plan to add ARB (as a bridge to ensure can tolerate Entresto) and titrate metoprolol. Would hope to be able to add SGLT2i in the future. Chart outlines hx of hidradenitis but she is unfamiliar with that. As for the etiology of her HF, it is not totally clear. Ddx includes stress-induced/myocarditis from illness earlier this year,  ETOH, familial, infiltrative. Update HIV. Do not anticipate this is an iron deposition issue given that she actually has a hx of iron deficiency. Discussed avoidance of ETOH, tobacco, and pregnancy (she is not pursuing this but just to make sure she is aware). Per d/w Dr. Harl Bowie, we'll order cardiac MRI and refer to advanced HF clinic for evaluation.   3. Mitral regurgitation - discussed with Dr. Harl Bowie whether she would require TEE. He reviewed echo and feels this is more likely functional in nature, and that initial management would be aimed at optimization of her heart failure treatment, hold on TEE for now.  4. CAD - nonobstructive by cath earlier this year. Not on ASA due to concomitant Coumadin and anemia. Atorvastatin started after prior visit. F/u CMET/lipid panel today.  5. H/o recurrent VTE - maintained on Coumadin, INR followed by Coumadin clinic.    Disposition: F/u with Dr. Harl Bowie or APP in 4 weeks, OK to see [facility]. Will otherwise plan on interim referrals to EP / CHF.   Medication Adjustments/Labs and Tests Ordered: Current medicines are reviewed at length with the patient today.  Concerns regarding medicines are outlined above. Medication changes, Labs and Tests ordered today are summarized above and listed in the Patient Instructions accessible in Encounters.    Signed, Charlie Pitter, PA-C  11/13/2021 12:56 PM    [place] Location in [place] [place], [place] [address] Ph: [phone]; Fax [phone]

## 2021-11-12 NOTE — Patient Instructions (Signed)
9/5 D/C [facility] on warfarin '5mg'$  daily (1/2 of '10mg'$  tablet) Increase warfarin to 1 tablet daily except 1/2 tablet on Sundays and Thursdays Recheck in 2 wks

## 2021-11-13 ENCOUNTER — Encounter: Payer: Self-pay | Admitting: Physician Assistant

## 2021-11-13 ENCOUNTER — Ambulatory Visit: Payer: Medicare HMO | Attending: Physician Assistant | Admitting: Physician Assistant

## 2021-11-13 VITALS — BP 122/70 | HR 76 | Ht 70.0 in | Wt 298.2 lb

## 2021-11-13 DIAGNOSIS — I10 Essential (primary) hypertension: Secondary | ICD-10-CM | POA: Diagnosis not present

## 2021-11-13 DIAGNOSIS — R55 Syncope and collapse: Secondary | ICD-10-CM

## 2021-11-13 DIAGNOSIS — I2510 Atherosclerotic heart disease of native coronary artery without angina pectoris: Secondary | ICD-10-CM | POA: Diagnosis not present

## 2021-11-13 DIAGNOSIS — I5042 Chronic combined systolic (congestive) and diastolic (congestive) heart failure: Secondary | ICD-10-CM

## 2021-11-13 DIAGNOSIS — I428 Other cardiomyopathies: Secondary | ICD-10-CM | POA: Diagnosis not present

## 2021-11-13 DIAGNOSIS — I340 Nonrheumatic mitral (valve) insufficiency: Secondary | ICD-10-CM | POA: Diagnosis not present

## 2021-11-13 DIAGNOSIS — I8290 Acute embolism and thrombosis of unspecified vein: Secondary | ICD-10-CM | POA: Diagnosis not present

## 2021-11-13 DIAGNOSIS — I471 Supraventricular tachycardia, unspecified: Secondary | ICD-10-CM | POA: Diagnosis not present

## 2021-11-13 NOTE — Patient Instructions (Addendum)
For patients with congestive heart failure, we give them these special instructions:   1. Follow a low-salt diet - you should be eating no more than 2,'000mg'$  of sodium per day. This does not necessarily just apply to the salt you put on top of prepared food, but the sodium already in food. Processed food, frozen meals, canned goods, deli meat, and bread can have a surprising amount of sodium per serving so be sure to track this daily. 2. Watch your fluid intake. In general, you should not be taking in more than 2 liters of fluid per day (close to 64 oz of fluid per day). This includes sources of water in foods like soup, coffee, tea, milk, etc. It's important to stay hydrated but NOT to excess. 2. Weigh yourself on the same scale at same time of day and keep a log. 3. Call your doctor: (Anytime you feel any of the following symptoms)  - 3lb weight gain overnight or 5lb within a few days - Shortness of breath, with or without a dry hacking cough  - Swelling in the hands, feet or stomach  - If you have to sleep on extra pillows at night in order to breathe  4. Alcohol can cause worsening heart failure. We would recommend to limit your alcohol intake. Avoiding alcohol completely would be the ultimate goal. 5. It is recommended that patients with congestive heart failure avoid trying to get pregnant. Heart failure medicines are not usually compatible with pregnancy or fetal development.  IT IS IMPORTANT TO LET YOUR DOCTOR KNOW EARLY ON IF YOU ARE HAVING SYMPTOMS SO WE CAN HELP YOU!   Medication Instructions:  Your physician recommends that you continue on your current medications as directed. Please refer to the Current Medication list given to you today.  *If you need a refill on your cardiac medications before your next appointment, please call your pharmacy*   Lab Work: Your physician recommends that you return for lab work in: Today   If you have labs (blood work) drawn today and your tests  are completely normal, you will receive your results only by: MyChart Message (if you have MyChart) OR A paper copy in the mail If you have any lab test that is abnormal or we need to change your treatment, we will call you to review the results.   Testing/Procedures: Your physician has requested that you have a cardiac MRI. Cardiac MRI uses a computer to create images of your heart as its beating, producing both still and moving pictures of your heart and major blood vessels. For further information please visit [email or web address] Please follow the instruction sheet given to you today for more information.    Follow-Up: At [facility], you and your health needs are our priority.  As part of our continuing mission to provide you with exceptional heart care, we have created designated Provider Care Teams.  These Care Teams include your primary Cardiologist (physician) and Advanced Practice Providers (APPs -  Physician Assistants and Nurse Practitioners) who all work together to provide you with the care you need, when you need it.  We recommend signing up for the patient portal called "MyChart".  Sign up information is provided on this After Visit Summary.  MyChart is used to connect with patients for Virtual Visits (Telemedicine).  Patients are able to view lab/test results, encounter notes, upcoming appointments, etc.  Non-urgent messages can be sent to your provider as well.   To learn more about what you can  do with MyChart, go to [email or web address].    Your next appointment:   4 week(s)  The format for your next appointment:   In Person  Provider:   You may see Carlyle Dolly, MD or one of the following Advanced Practice Providers on your designated Care Team:   Bernerd Pho, PA-C  Ermalinda Barrios, [place]     Other Instructions Thank you for choosing [facility]!    Important Information About Sugar

## 2021-11-14 ENCOUNTER — Telehealth: Payer: Self-pay

## 2021-11-14 ENCOUNTER — Other Ambulatory Visit (HOSPITAL_COMMUNITY)
Admission: RE | Admit: 2021-11-14 | Discharge: 2021-11-14 | Disposition: A | Discharge status: Home / Self Care | Payer: Medicare HMO | Source: Ambulatory Visit | Attending: Physician Assistant | Admitting: Physician Assistant

## 2021-11-14 DIAGNOSIS — I10 Essential (primary) hypertension: Secondary | ICD-10-CM | POA: Diagnosis not present

## 2021-11-14 DIAGNOSIS — I2510 Atherosclerotic heart disease of native coronary artery without angina pectoris: Secondary | ICD-10-CM | POA: Diagnosis not present

## 2021-11-14 DIAGNOSIS — Z1389 Encounter for screening for other disorder: Secondary | ICD-10-CM | POA: Diagnosis not present

## 2021-11-14 DIAGNOSIS — I340 Nonrheumatic mitral (valve) insufficiency: Secondary | ICD-10-CM | POA: Diagnosis not present

## 2021-11-14 DIAGNOSIS — Z79899 Other long term (current) drug therapy: Secondary | ICD-10-CM

## 2021-11-14 DIAGNOSIS — Z1331 Encounter for screening for depression: Secondary | ICD-10-CM | POA: Diagnosis not present

## 2021-11-14 DIAGNOSIS — I471 Supraventricular tachycardia, unspecified: Secondary | ICD-10-CM | POA: Diagnosis not present

## 2021-11-14 DIAGNOSIS — I8291 Chronic embolism and thrombosis of unspecified vein: Secondary | ICD-10-CM | POA: Diagnosis not present

## 2021-11-14 DIAGNOSIS — I428 Other cardiomyopathies: Secondary | ICD-10-CM | POA: Diagnosis not present

## 2021-11-14 DIAGNOSIS — I5042 Chronic combined systolic (congestive) and diastolic (congestive) heart failure: Secondary | ICD-10-CM | POA: Diagnosis not present

## 2021-11-14 DIAGNOSIS — Z0001 Encounter for general adult medical examination with abnormal findings: Secondary | ICD-10-CM | POA: Diagnosis not present

## 2021-11-14 DIAGNOSIS — Z23 Encounter for immunization: Secondary | ICD-10-CM | POA: Diagnosis not present

## 2021-11-14 DIAGNOSIS — I5030 Unspecified diastolic (congestive) heart failure: Secondary | ICD-10-CM | POA: Diagnosis not present

## 2021-11-14 DIAGNOSIS — R55 Syncope and collapse: Secondary | ICD-10-CM | POA: Insufficient documentation

## 2021-11-14 DIAGNOSIS — I8290 Acute embolism and thrombosis of unspecified vein: Secondary | ICD-10-CM | POA: Insufficient documentation

## 2021-11-14 LAB — COMPREHENSIVE METABOLIC PANEL
ALT: 29 U/L (ref 0–44)
AST: 20 U/L (ref 15–41)
Albumin: 3.2 g/dL — ABNORMAL LOW (ref 3.5–5.0)
Alkaline Phosphatase: 69 U/L (ref 38–126)
Anion gap: 9 (ref 5–15)
BUN: 19 mg/dL (ref 6–20)
CO2: 28 mmol/L (ref 22–32)
Calcium: 9.1 mg/dL (ref 8.9–10.3)
Chloride: 102 mmol/L (ref 98–111)
Creatinine, Ser: 1.15 mg/dL — ABNORMAL HIGH (ref 0.44–1.00)
GFR, Estimated: 60 mL/min — ABNORMAL LOW (ref >60)
Glucose, Bld: 100 mg/dL — ABNORMAL HIGH (ref 70–99)
Potassium: 3.9 mmol/L (ref 3.5–5.1)
Sodium: 139 mmol/L (ref 135–145)
Total Bilirubin: 1 mg/dL (ref 0.3–1.2)
Total Protein: 7.3 g/dL (ref 6.5–8.1)

## 2021-11-14 LAB — LIPID PANEL
Cholesterol: 77 mg/dL (ref 0–200)
HDL: 27 mg/dL — ABNORMAL LOW (ref >40)
LDL Cholesterol: 43 mg/dL (ref 0–99)
Total CHOL/HDL Ratio: 2.9 RATIO
Triglycerides: 35 mg/dL (ref <150)
VLDL: 7 mg/dL (ref 0–40)

## 2021-11-14 LAB — CBC
HCT: 30 % — ABNORMAL LOW (ref 36.0–46.0)
Hemoglobin: 8.1 g/dL — ABNORMAL LOW (ref 12.0–15.0)
MCH: 19 pg — ABNORMAL LOW (ref 26.0–34.0)
MCHC: 27 g/dL — ABNORMAL LOW (ref 30.0–36.0)
MCV: 70.4 fL — ABNORMAL LOW (ref 80.0–100.0)
Platelets: 235 10*3/uL (ref 150–400)
RBC: 4.26 MIL/uL (ref 3.87–5.11)
RDW: 24.4 % — ABNORMAL HIGH (ref 11.5–15.5)
WBC: 4.3 10*3/uL (ref 4.0–10.5)
nRBC: 0 % (ref 0.0–0.2)

## 2021-11-14 LAB — TSH: TSH: 3.122 u[IU]/mL (ref 0.350–4.500)

## 2021-11-14 LAB — MAGNESIUM: Magnesium: 1.9 mg/dL (ref 1.7–2.4)

## 2021-11-14 LAB — HIV ANTIBODY (ROUTINE TESTING W REFLEX): HIV Screen 4th Generation wRfx: NONREACTIVE

## 2021-11-14 MED ORDER — MAGNESIUM OXIDE 400 MG PO CAPS: 400.0000 mg | ORAL_CAPSULE | Freq: Every day | ORAL | 3 refills | Status: DC

## 2021-11-14 MED ORDER — LOSARTAN POTASSIUM 25 MG PO TABS: 25.0000 mg | ORAL_TABLET | Freq: Every day | ORAL | 3 refills | Status: DC

## 2021-11-14 MED ORDER — POTASSIUM CHLORIDE ER 10 MEQ PO TBCR: 10.0000 meq | EXTENDED_RELEASE_TABLET | Freq: Every day | ORAL | 3 refills | Status: DC

## 2021-11-14 MED ORDER — METOPROLOL SUCCINATE ER 50 MG PO TB24: 50.0000 mg | ORAL_TABLET | Freq: Every day | ORAL | 3 refills | Status: DC

## 2021-11-14 NOTE — Telephone Encounter (Signed)
-----   Message from Charlie Pitter, [place] sent at 11/14/2021  2:34 PM EDT ----- Labs stable. Kidney function abnormal but similar to prior. Remains anemic, needs to f/u her hematologist for this. Electrolytes are just below goal given her v-tach on monitor (goal K 4.0 or greater, Mg 2.0 or greater). Recs: - Increase metoprolol succinate to '50mg'$  daily - Add losartan '25mg'$  daily - Add KCl 69mq daily - Add Mag Ox '400mg'$  daily - Repeat BMET/Mg in 1 week - I would suggest she start checking her BP at least once daily after AM meds. When we get her labs, we'll check in with her about how BP is running. Otherwise continue plan as discussed.

## 2021-11-14 NOTE — Telephone Encounter (Signed)
Patient notified and verbalized understanding. Patient had no questions or concerns at this time. PCP copied 

## 2021-11-15 ENCOUNTER — Other Ambulatory Visit: Payer: Self-pay

## 2021-11-15 ENCOUNTER — Emergency Department (HOSPITAL_COMMUNITY)
Admission: EM | Admit: 2021-11-15 | Discharge: 2021-11-15 | Disposition: A | Discharge status: Home / Self Care | Payer: Medicare HMO | Attending: Emergency Medicine | Admitting: Emergency Medicine

## 2021-11-15 ENCOUNTER — Encounter (HOSPITAL_COMMUNITY): Payer: Self-pay | Admitting: *Deleted

## 2021-11-15 ENCOUNTER — Encounter (HOSPITAL_COMMUNITY): Payer: Self-pay

## 2021-11-15 ENCOUNTER — Emergency Department (HOSPITAL_COMMUNITY): Payer: Medicare HMO

## 2021-11-15 ENCOUNTER — Encounter (HOSPITAL_COMMUNITY): Payer: Self-pay | Admitting: Hematology

## 2021-11-15 DIAGNOSIS — R519 Headache, unspecified: Secondary | ICD-10-CM | POA: Diagnosis not present

## 2021-11-15 DIAGNOSIS — S161XXA Strain of muscle, fascia and tendon at neck level, initial encounter: Secondary | ICD-10-CM | POA: Diagnosis not present

## 2021-11-15 DIAGNOSIS — S199XXA Unspecified injury of neck, initial encounter: Secondary | ICD-10-CM | POA: Diagnosis present

## 2021-11-15 DIAGNOSIS — Y92219 Unspecified school as the place of occurrence of the external cause: Secondary | ICD-10-CM | POA: Insufficient documentation

## 2021-11-15 DIAGNOSIS — Z7901 Long term (current) use of anticoagulants: Secondary | ICD-10-CM | POA: Diagnosis not present

## 2021-11-15 DIAGNOSIS — I2510 Atherosclerotic heart disease of native coronary artery without angina pectoris: Secondary | ICD-10-CM | POA: Diagnosis not present

## 2021-11-15 DIAGNOSIS — M542 Cervicalgia: Secondary | ICD-10-CM | POA: Diagnosis not present

## 2021-11-15 DIAGNOSIS — S12190A Other displaced fracture of second cervical vertebra, initial encounter for closed fracture: Secondary | ICD-10-CM | POA: Diagnosis not present

## 2021-11-15 DIAGNOSIS — M545 Low back pain, unspecified: Secondary | ICD-10-CM | POA: Insufficient documentation

## 2021-11-15 DIAGNOSIS — S39012A Strain of muscle, fascia and tendon of lower back, initial encounter: Secondary | ICD-10-CM

## 2021-11-15 DIAGNOSIS — M47816 Spondylosis without myelopathy or radiculopathy, lumbar region: Secondary | ICD-10-CM | POA: Diagnosis not present

## 2021-11-15 DIAGNOSIS — S12101A Unspecified nondisplaced fracture of second cervical vertebra, initial encounter for closed fracture: Secondary | ICD-10-CM

## 2021-11-15 LAB — PREGNANCY, URINE: Preg Test, Ur: NEGATIVE

## 2021-11-15 MED ORDER — OXYCODONE-ACETAMINOPHEN 5-325 MG PO TABS
1.0000 | ORAL_TABLET | Freq: Once | ORAL | Status: AC
  Administered 2021-11-15: 1 via ORAL
  Filled 2021-11-15: qty 1

## 2021-11-15 MED ORDER — OXYCODONE-ACETAMINOPHEN 5-325 MG PO TABS: ORAL_TABLET | ORAL | 0 refills | Status: DC

## 2021-11-15 NOTE — ED Notes (Signed)
Pt resting with eyes closed and even unlabored respirations. Bed in low locked position with side rails up x2 and call bell in reach. No s/s of acute distress.
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
## 2021-11-15 NOTE — ED Notes (Signed)
Pt reports tenderness in lumbar area, and tenderness to touch in trapezius muscles bilaterally
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
## 2021-11-15 NOTE — ED Notes (Signed)
Urine requested from pt- stated cannot go right now. Water given
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
## 2021-11-15 NOTE — ED Notes (Signed)
[facility] called at this time to page neurosurgeon.

## 2021-11-15 NOTE — ED Notes (Signed)
Urine sent

## 2021-11-15 NOTE — ED Notes (Signed)
C-collar applied per MD.

## 2021-11-16 ENCOUNTER — Telehealth: Payer: Self-pay | Admitting: Emergency Medicine

## 2021-11-16 NOTE — Telephone Encounter (Signed)
[name] [name], [name] View  [name], [name] [name], [name] [name] I'm not showing any O2 on file for this patient. Im not even able to pull up an account.

## 2021-11-16 NOTE — Telephone Encounter (Signed)
Jasmine with [employer] responded "Im creating a ticket for this now & see when we can get a driver to collect the items."

## 2021-11-16 NOTE — Telephone Encounter (Signed)
Pt called and stated that she still has her O2 in the house when this was supposed to have been discontinued.   Order placed in January 2023 after pt's OV with RB for her O2 to be discontinued.  Routing to [facility] for assistance with this. DME: [employer].

## 2021-11-16 NOTE — Telephone Encounter (Signed)
Urgent CM sent to [employer] asking them to check on this issue

## 2021-11-16 NOTE — Telephone Encounter (Signed)
I called Mrs. [name] [name] and told her what [employer] had stated.  She states that the 02 is from [place] and gave me their fax # [phone]. I have faxed the order to them

## 2021-11-27 ENCOUNTER — Ambulatory Visit: Payer: Medicare HMO | Attending: Cardiology | Admitting: *Deleted

## 2021-11-27 DIAGNOSIS — Z86711 Personal history of pulmonary embolism: Secondary | ICD-10-CM | POA: Diagnosis not present

## 2021-11-27 DIAGNOSIS — I2602 Saddle embolus of pulmonary artery with acute cor pulmonale: Secondary | ICD-10-CM | POA: Diagnosis not present

## 2021-11-27 DIAGNOSIS — Z5181 Encounter for therapeutic drug level monitoring: Secondary | ICD-10-CM

## 2021-11-27 DIAGNOSIS — I2782 Chronic pulmonary embolism: Secondary | ICD-10-CM

## 2021-11-27 LAB — POCT INR: INR: 4.3 — AB (ref 2.0–3.0)

## 2021-11-27 NOTE — Patient Instructions (Signed)
Hold warfarin tonight then decrease dose to 1/2 tablet daily except 1 tablet on Mondays, Wednesdays and Fridays Recheck in 2 wks

## 2021-11-29 DIAGNOSIS — Z6841 Body Mass Index (BMI) 40.0 and over, adult: Secondary | ICD-10-CM | POA: Diagnosis not present

## 2021-11-29 DIAGNOSIS — M542 Cervicalgia: Secondary | ICD-10-CM | POA: Diagnosis not present

## 2021-12-11 ENCOUNTER — Ambulatory Visit: Payer: Medicare HMO | Attending: Cardiology | Admitting: *Deleted

## 2021-12-11 DIAGNOSIS — Z86711 Personal history of pulmonary embolism: Secondary | ICD-10-CM | POA: Diagnosis not present

## 2021-12-11 DIAGNOSIS — I2782 Chronic pulmonary embolism: Secondary | ICD-10-CM

## 2021-12-11 DIAGNOSIS — I2602 Saddle embolus of pulmonary artery with acute cor pulmonale: Secondary | ICD-10-CM | POA: Diagnosis not present

## 2021-12-11 DIAGNOSIS — Z5181 Encounter for therapeutic drug level monitoring: Secondary | ICD-10-CM

## 2021-12-11 LAB — POCT INR: INR: 5.1 — AB (ref 2.0–3.0)

## 2021-12-11 NOTE — Patient Instructions (Addendum)
Hold warfarin tonight and tomorrow night then decrease dose to 1/2 tablet daily except 1 tablet on Mondays Recheck in 2 wks Bleeding and fall precautions discussed with pt and she verbalized understanding.

## 2021-12-16 ENCOUNTER — Emergency Department (HOSPITAL_COMMUNITY): Payer: Medicare HMO

## 2021-12-16 ENCOUNTER — Emergency Department (HOSPITAL_COMMUNITY)
Admission: EM | Admit: 2021-12-16 | Discharge: 2021-12-16 | Disposition: A | Discharge status: Home / Self Care | Payer: Medicare HMO | Attending: Emergency Medicine | Admitting: Emergency Medicine

## 2021-12-16 ENCOUNTER — Other Ambulatory Visit: Payer: Self-pay

## 2021-12-16 ENCOUNTER — Encounter (HOSPITAL_COMMUNITY): Payer: Self-pay | Admitting: *Deleted

## 2021-12-16 DIAGNOSIS — R058 Other specified cough: Secondary | ICD-10-CM | POA: Diagnosis not present

## 2021-12-16 DIAGNOSIS — Z7901 Long term (current) use of anticoagulants: Secondary | ICD-10-CM | POA: Diagnosis not present

## 2021-12-16 DIAGNOSIS — I2510 Atherosclerotic heart disease of native coronary artery without angina pectoris: Secondary | ICD-10-CM | POA: Insufficient documentation

## 2021-12-16 DIAGNOSIS — N61 Mastitis without abscess: Secondary | ICD-10-CM | POA: Diagnosis not present

## 2021-12-16 DIAGNOSIS — Z1152 Encounter for screening for COVID-19: Secondary | ICD-10-CM | POA: Diagnosis not present

## 2021-12-16 DIAGNOSIS — R059 Cough, unspecified: Secondary | ICD-10-CM | POA: Diagnosis not present

## 2021-12-16 DIAGNOSIS — Z79899 Other long term (current) drug therapy: Secondary | ICD-10-CM | POA: Insufficient documentation

## 2021-12-16 DIAGNOSIS — R0789 Other chest pain: Secondary | ICD-10-CM | POA: Diagnosis not present

## 2021-12-16 DIAGNOSIS — N63 Unspecified lump in unspecified breast: Secondary | ICD-10-CM | POA: Diagnosis present

## 2021-12-16 DIAGNOSIS — I10 Essential (primary) hypertension: Secondary | ICD-10-CM | POA: Insufficient documentation

## 2021-12-16 LAB — CBC WITH DIFFERENTIAL/PLATELET
Abs Immature Granulocytes: 0.01 10*3/uL (ref 0.00–0.07)
Basophils Absolute: 0.1 10*3/uL (ref 0.0–0.1)
Basophils Relative: 2 %
Eosinophils Absolute: 0.1 10*3/uL (ref 0.0–0.5)
Eosinophils Relative: 3 %
HCT: 30.8 % — ABNORMAL LOW (ref 36.0–46.0)
Hemoglobin: 8.4 g/dL — ABNORMAL LOW (ref 12.0–15.0)
Immature Granulocytes: 0 %
Lymphocytes Relative: 26 %
Lymphs Abs: 1.1 10*3/uL (ref 0.7–4.0)
MCH: 19.1 pg — ABNORMAL LOW (ref 26.0–34.0)
MCHC: 27.3 g/dL — ABNORMAL LOW (ref 30.0–36.0)
MCV: 70 fL — ABNORMAL LOW (ref 80.0–100.0)
Monocytes Absolute: 0.4 10*3/uL (ref 0.1–1.0)
Monocytes Relative: 8 %
Neutro Abs: 2.7 10*3/uL (ref 1.7–7.7)
Neutrophils Relative %: 61 %
Platelets: 240 10*3/uL (ref 150–400)
RBC: 4.4 MIL/uL (ref 3.87–5.11)
RDW: 22.6 % — ABNORMAL HIGH (ref 11.5–15.5)
WBC: 4.3 10*3/uL (ref 4.0–10.5)
nRBC: 0 % (ref 0.0–0.2)

## 2021-12-16 LAB — BASIC METABOLIC PANEL
Anion gap: 8 (ref 5–15)
BUN: 16 mg/dL (ref 6–20)
CO2: 24 mmol/L (ref 22–32)
Calcium: 8.8 mg/dL — ABNORMAL LOW (ref 8.9–10.3)
Chloride: 100 mmol/L (ref 98–111)
Creatinine, Ser: 0.93 mg/dL (ref 0.44–1.00)
GFR, Estimated: >60 mL/min (ref >60)
Glucose, Bld: 86 mg/dL (ref 70–99)
Potassium: 4.3 mmol/L (ref 3.5–5.1)
Sodium: 132 mmol/L — ABNORMAL LOW (ref 135–145)

## 2021-12-16 LAB — RESP PANEL BY RT-PCR (FLU A&B, COVID) ARPGX2
Influenza A by PCR: NEGATIVE
Influenza B by PCR: NEGATIVE
SARS Coronavirus 2 by RT PCR: NEGATIVE

## 2021-12-16 MED ORDER — IOHEXOL 300 MG/ML  SOLN
75.0000 mL | Freq: Once | INTRAMUSCULAR | Status: AC | PRN
  Administered 2021-12-16: 75 mL via INTRAVENOUS

## 2021-12-16 MED ORDER — LIDOCAINE HCL (PF) 1 % IJ SOLN
INTRAMUSCULAR | Status: AC
  Administered 2021-12-16: 5 mL
  Filled 2021-12-16: qty 5

## 2021-12-16 MED ORDER — HYDROCODONE-ACETAMINOPHEN 5-325 MG PO TABS: ORAL_TABLET | ORAL | 0 refills | Status: DC

## 2021-12-16 MED ORDER — OXYCODONE-ACETAMINOPHEN 5-325 MG PO TABS
1.0000 | ORAL_TABLET | Freq: Once | ORAL | Status: AC
  Administered 2021-12-16: 1 via ORAL
  Filled 2021-12-16: qty 1

## 2021-12-16 MED ORDER — CEFTRIAXONE SODIUM 1 G IJ SOLR
1.0000 g | Freq: Once | INTRAMUSCULAR | Status: AC
  Administered 2021-12-16: 1 g via INTRAMUSCULAR
  Filled 2021-12-16: qty 10

## 2021-12-16 MED ORDER — LIDOCAINE HCL (PF) 2 % IJ SOLN
INTRAMUSCULAR | Status: AC
  Filled 2021-12-16: qty 10

## 2021-12-16 MED ORDER — LIDOCAINE HCL (PF) 1 % IJ SOLN: 2.1000 mL | Freq: Once | INTRAMUSCULAR | Status: AC

## 2021-12-16 MED ORDER — AMOXICILLIN-POT CLAVULANATE 875-125 MG PO TABS: 1.0000 | ORAL_TABLET | Freq: Two times a day (BID) | ORAL | 0 refills | Status: DC

## 2021-12-16 NOTE — ED Triage Notes (Signed)
Pt with mammogram last year and noted to have a mass, biopsy with negative for cancer.  Pt states right breast swelling for over a year but past two days the swelling is worse.

## 2021-12-16 NOTE — ED Notes (Signed)
Ice chips given to pt as PA-C stated pt could have ice chips

## 2021-12-16 NOTE — ED Provider Notes (Signed)
[facility] EMERGENCY DEPARTMENT Provider Note   CSN: [id number] Arrival date & time: 12/16/21  1127     History  Chief Complaint  Patient presents with   Breast Problem    [name] [name] is a 46 y.o. female.  HPI     [name] [name] is a 46 y.o. female with past medical history of hypertension, nonsustained SVT, anemia, coronary artery disease, history of clotting disorder, chronically anticoagulated secondary to DVT and PE.  she presents to the Emergency Department complaining of swelling and pain to right breast.  She has hx of mass to her right breast and underwent biopsy last year and mass was benign and felt to be related to abscess.  She notes significant swelling of her breast x 2 days.  She has a throbbing pain to the breast and notes an intermittent cloudy to clear drainage from her nipple.  She denies any fever, chills, nausea, vomiting or shortness of breath.  She also denies any redness or excessive warmth of the breast.     Home Medications Prior to Admission medications   Medication Sig Start Date End Date Taking? Authorizing Provider  acetaminophen (TYLENOL) 325 MG tablet Take 2 tablets (650 mg total) by mouth every 6 (six) hours as needed for mild pain (or Fever >/= 101). 09/25/21   Nita Sells, MD  albuterol (VENTOLIN HFA) 108 (90 Base) MCG/ACT inhaler Inhale 2 puffs into the lungs every 2 (two) hours as needed for wheezing or shortness of breath. 09/13/21   Rigoberto Noel, MD  atorvastatin (LIPITOR) 20 MG tablet Take 1 tablet (20 mg total) by mouth daily. 09/10/21   Arnoldo Lenis, MD  furosemide (LASIX) 80 MG tablet Take 1 tablet (80 mg total) by mouth daily. 06/23/21   Regalado, Belkys A, MD  losartan (COZAAR) 25 MG tablet Take 1 tablet (25 mg total) by mouth daily. Patient not taking: Reported on 11/15/2021 11/14/21 11/09/22  Charlie Pitter, PA-C  Magnesium Oxide 400 MG CAPS Take 1 capsule (400 mg total) by mouth daily. Patient not taking: Reported on  11/15/2021 11/14/21   Charlie Pitter, PA-C  metoprolol succinate (TOPROL-XL) 50 MG 24 hr tablet Take 1 tablet (50 mg total) by mouth daily. Take with or immediately following a meal. 11/14/21 11/09/22  Dunn, Nedra Hai, PA-C  oxyCODONE-acetaminophen (PERCOCET/ROXICET) 5-325 MG tablet Take 1 every 6 hours for pain not relieved by Tylenol 11/15/21   Milton Ferguson, MD  potassium chloride (KLOR-CON) 10 MEQ tablet Take 1 tablet (10 mEq total) by mouth daily. Patient not taking: Reported on 11/15/2021 11/14/21 11/09/22  Charlie Pitter, PA-C  spironolactone (ALDACTONE) 25 MG tablet Take 0.5 tablets (12.5 mg total) by mouth daily. 06/22/21   Regalado, Belkys A, MD  traMADol (ULTRAM) 50 MG tablet Take 2 tablets (100 mg total) by mouth every 6 (six) hours as needed for moderate pain. Patient not taking: Reported on 11/13/2021 09/25/21   Nita Sells, MD  warfarin (COUMADIN) 5 MG tablet TAKE (1) OR (1) 1/2 TABLET BY MOUTH DAILY AS DIRECTED. Patient taking differently: Take 5-10 mg by mouth daily. Take '5mg'$  Sundays and Thursday and '10mg'$  the other days 09/25/21   Nita Sells, MD      Allergies    Sulfa antibiotics    Review of Systems   Review of Systems  Constitutional:  Negative for appetite change, chills and fever.  Respiratory:  Negative for chest tightness and shortness of breath.   Cardiovascular:  Negative for leg swelling.  Pain, swelling to right breast  Gastrointestinal:  Negative for abdominal pain, nausea and vomiting.  Genitourinary:  Negative for flank pain.  Skin:  Negative for color change and wound.  Neurological:  Negative for dizziness, weakness, numbness and headaches.    Physical Exam Updated Vital Signs BP (!) 128/91 (BP Location: Left Arm)   Pulse 79   Temp 97.7 F (36.5 C) (Oral)   Resp 20   Ht '5\' 10"'$  (1.778 m)   Wt 123.8 kg   LMP 11/29/2021   SpO2 98%   BMI 39.17 kg/m  Physical Exam Vitals and nursing note reviewed.  Constitutional:      General: She  is not in acute distress.    Appearance: Normal appearance. She is not ill-appearing or toxic-appearing.  Cardiovascular:     Rate and Rhythm: Normal rate and regular rhythm.     Pulses: Normal pulses.  Pulmonary:     Effort: Pulmonary effort is normal.  Chest:  Breasts:    Right: Swelling, inverted nipple, skin change and tenderness present. No nipple discharge.     Comments: Grossly enlarged right breast.,  nipple inverted.  Peau d'orange breast.  Indurated at the 2-3 o clock position of the breast.  No nipple drainage.  No erythema or excessive warmth of the skin Abdominal:     General: There is no distension.     Palpations: Abdomen is soft.     Tenderness: There is no abdominal tenderness.  Musculoskeletal:        General: Normal range of motion.  Skin:    General: Skin is warm.     Capillary Refill: Capillary refill takes less than 2 seconds.     Findings: No erythema or rash.  Neurological:     General: No focal deficit present.     Mental Status: She is alert.     Sensory: No sensory deficit.     Motor: No weakness.    ED Results / Procedures / Treatments   Labs (all labs ordered are listed, but only abnormal results are displayed) Labs Reviewed  CBC WITH DIFFERENTIAL/PLATELET - Abnormal; Notable for the following components:      Result Value   Hemoglobin 8.4 (*)    HCT 30.8 (*)    MCV 70.0 (*)    MCH 19.1 (*)    MCHC 27.3 (*)    RDW 22.6 (*)    All other components within normal limits  BASIC METABOLIC PANEL - Abnormal; Notable for the following components:   Sodium 132 (*)    Calcium 8.8 (*)    All other components within normal limits  RESP PANEL BY RT-PCR (FLU A&B, COVID) [id number]    EKG None  Radiology CT Chest W Contrast  Result Date: 12/16/2021 CLINICAL DATA:  Chest wall mass EXAM: CT CHEST WITH CONTRAST TECHNIQUE: Multidetector CT imaging of the chest was performed during intravenous contrast administration. RADIATION DOSE REDUCTION: This exam was  performed according to the departmental dose-optimization program which includes automated exposure control, adjustment of the mA and/or kV according to patient size and/or use of iterative reconstruction technique. CONTRAST:  41m OMNIPAQUE IOHEXOL 300 MG/ML  SOLN COMPARISON:  Mammogram 01/20/2020, breast ultrasound 02/01/2020, 01/20/2020, chest CT 06/05/2021. Chest x-ray 12/16/2021 FINDINGS: Cardiovascular: Nonaneurysmal aorta. Borderline cardiomegaly. No pericardial effusion. Mediastinum/Nodes: Midline trachea. No thyroid mass. Esophagus within normal limits. Multiple enlarged right axillary lymph nodes measuring up to 2.3 cm. Lungs/Pleura: No acute airspace disease, pleural effusion, or pneumothorax. Mild subpleural reticulation within the right  anterior upper lobe. Upper Abdomen: Small volume ascites adjacent to the liver and spleen. Generalized mesenteric stranding. Partially visualized IVC filter. 2 cm hyperenhancing area in the central liver, series 2, image 154. This appears present on 06/05/2021 chest CT with suggestion of peripheral enhancement as may be seen with hemangioma. Musculoskeletal: No acute osseous abnormality. Marked diffuse skin thickening and generalized edema and stranding throughout the entirety of the right breast. No discrete rim enhancing collection by CT. The nipple areola complex is poorly defined but suspect that the nipple is retracted. There is generalized edema throughout the subcutaneous soft tissues consistent with anasarca. IMPRESSION: 1. Marked diffuse skin thickening involving the right breast with generalized edema and soft tissue stranding. Poorly defined nipple areola complex but suspect that the nipple is retracted, correlate with direct inspection. Primary differential considerations include inflammatory breast cancer versus mastitis. Surgical consultation is recommended. There are multiple enlarged right axillary lymph nodes. 2. There is considerable generalized  subcutaneous edema diffusely consistent with anasarca. There is small volume ascites within the upper abdomen. 3. Hyperenhancing lesion within the central liver, potential hemangioma. When the patient is clinically stable and able to follow directions and hold their breath (preferably as an outpatient) further evaluation with dedicated abdominal MRI should be considered. Electronically Signed   By: Donavan Foil M.D.   On: 12/16/2021 16:13    Procedures Procedures    Medications Ordered in ED Medications - No data to display  ED Course/ Medical Decision Making/ A&P                           Medical Decision Making Patient here for evaluation of pain and swelling of her right breast.  Symptoms present for 2 days.  She notes history of prior breast mass with benign biopsy 1 year ago.  She endorses some intermittent clear to cloudy drainage from her nipple.  Nipple inverted at baseline.  Denies chest pain or shortness of breath.  No fever or chills.  On my exam, patient nontoxic-appearing.  No tachycardia tachypnea or hypoxia.  She is afebrile.  Resting comfortably on the stretcher.  There is significant edema of the right breast.  There is an area of induration that she states is at the area of the previous biopsy.  Do not appreciate any excessive warmth, erythema of the breast.  I am unable to express any drainage from the nipple.  Exam is concerning for malignancy versus infection.  Ideally, she would need ultrasound imaging of the breast.  This is unavailable here on weekend.  I have spoken with radiology in [place] regarding value of CT of the chest.  Felt that ultrasound would be the imaging of preference.  Since abscess is of high concern, it was felt that it be appropriate to proceed with contrasted CT study as abscess should be visualized.   Amount and/or Complexity of Data Reviewed Labs:     Details: Labs interpreted by me, no evidence of leukocytosis, hemoglobin 8.4 today.  This  appears to be her baseline.  Chemistries without significant derangement.  COVID flu and RSV test were negative. Radiology: ordered.    Details: CT of the chest shows diffuse skin thickening of the right breast with generalized edema and soft tissue stranding.  Differential considerations would include inflammatory breast cancer versus mastitis. Discussion of management or test interpretation with external provider(s): Patient without evidence of sepsis.  Significantly enlarged right breast on exam, I do not appreciate any  evidence of a abscess.  I suspect this is related to mastitis.  IM dose of Rocephin given here, she will be started on Augmentin.  She has OB/GYN, [facility], agreeable to close outpatient follow-up as she will likely need ultrasound studies of the breast.  Risk Prescription drug management.           Final Clinical Impression(s) / ED Diagnoses Final diagnoses:  Mastitis    Rx / DC Orders ED Discharge Orders     None         Bufford Lope 12/16/21 2332    Godfrey Pick, MD 12/17/21 [phone]
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
## 2021-12-16 NOTE — Discharge Instructions (Signed)
You likely have a infection to your right breast.  You have been given antibiotic here and prescribed antibiotics to take twice a day for 7 days.  Please take medication as directed.  Call Dr. Brynda Greathouse office to arrange follow-up appointment for this week.  Return to the emergency department if you develop any new or worsening symptoms

## 2021-12-20 ENCOUNTER — Ambulatory Visit
Payer: No Typology Code available for payment source | Attending: Cardiovascular Disease | Admitting: Cardiovascular Disease

## 2021-12-20 ENCOUNTER — Encounter: Payer: Self-pay | Admitting: Cardiovascular Disease

## 2021-12-20 ENCOUNTER — Telehealth: Payer: Self-pay | Admitting: Emergency Medicine

## 2021-12-20 VITALS — BP 82/58 | HR 70 | Ht 71.0 in | Wt 329.0 lb

## 2021-12-20 DIAGNOSIS — I4729 Other ventricular tachycardia: Secondary | ICD-10-CM | POA: Diagnosis not present

## 2021-12-20 NOTE — Patient Instructions (Signed)
Medication Instructions:  Your physician recommends that you continue on your current medications as directed. Please refer to the Current Medication list given to you today.  *If you need a refill on your cardiac medications before your next appointment, please call your pharmacy*  Testing/Procedures: Your physician has requested that you have an echocardiogram (after January 25th). Echocardiography is a painless test that uses sound waves to create images of your heart. It provides your doctor with information about the size and shape of your heart and how well your heart's chambers and valves are working. This procedure takes approximately one hour. There are no restrictions for this procedure. Please do NOT wear cologne, perfume, aftershave, or lotions (deodorant is allowed). Please arrive 15 minutes prior to your appointment time.   Follow-Up: At [facility], you and your health needs are our priority.  As part of our continuing mission to provide you with exceptional heart care, we have created designated Provider Care Teams.  These Care Teams include your primary Cardiologist (physician) and Advanced Practice Providers (APPs -  Physician Assistants and Nurse Practitioners) who all work together to provide you with the care you need, when you need it.  Your next appointment:   1 week after Echo  The format for your next appointment:   In Person  Provider:   Doralee Albino, MD     Important Information About Sugar

## 2021-12-20 NOTE — Progress Notes (Signed)
Electrophysiology Office Note:    Date:  12/20/2021   ID:  [name] [name], DOB 1975-10-09, MRN [id number]  PCP:  Carrolyn Meiers, [place] Prov[facility] Cardiologist:  Carlyle Dolly, MD Electrophysiologist:  Melida Quitter, MD     Referring MD: Charlie Pitter, PA-C    History of Present Illness:    [name] [name] is a 46 y.o. female with a hx relevant for CHFrEF, sleep apnea, unprovoked saddle pulmonary embolism in 2015, unprovoked DVT in 2023 (after missing doses of anticoagulation), presence of IVC filter, pulmonary hypertension, moderate severe MR, nonobstructive CAD referred for arrhythmia management.  She has a complex medical history.  Reviewed the note by the referring provider, Melina Copa, from 11/13/2021 that outlines the patient's clinical course. In brief, the patient has a history of depressed EF, recently < 35%. She has not been able to tolerate GDMT in the past due to low Bps. She had an episode of syncope that occurred while she was battling prolonged nausea with dry heaves.  A 30-day monitor was placed that showed nonsustained VT. Otherwise, she has not had any syncope.  Her primary complaint is tenderness and swelling of the right breast that began over the weekend. She was evaluated in the ER and referred for outpatient management with her gynecologist. Otherwise, her exertional capacity and work of breathing is stable.  Past Medical History:  Diagnosis Date   Abdominal cramps 08/30/2015   Anemia    Blood transfusion without reported diagnosis    CAD (coronary artery disease)    Chronic combined systolic and diastolic CHF (congestive heart failure) (HCC)    Closed left ankle fracture    August 30 2012   Clotting disorder [facility])    DVT (deep venous thrombosis) (HCC)    L leg   Fibroid tumor    Hidradenitis    High blood pressure    Hypertension    Mitral regurgitation    NICM (nonischemic cardiomyopathy) (HCC)    NSVT (nonsustained  ventricular tachycardia) (HCC)    Obesity    OSA on CPAP    Oxygen deficiency    Pregnancy    09/20/14 17 weeks   Pulmonary embolism ([place]) 04/2013   Sleep apnea    Tobacco use disorder     Past Surgical History:  Procedure Laterality Date   ABSCESS DRAINAGE Bilateral 01/10/15   COLONOSCOPY WITH PROPOFOL N/A 06/08/2018   Procedure: COLONOSCOPY WITH PROPOFOL;  Surgeon: Daneil Dolin, MD;  [facility];  Service: Endoscopy;  Laterality: N/A;  2:30pm   CYST REMOVAL TRUNK     IVC FILTER PLACEMENT ([place] HX)     LOWER EXTREMITY VENOGRAPHY N/A 02/07/2021   Procedure: LOWER EXTREMITY VENOGRAPHY;  Surgeon: [name], MD;  Location: [place] CV LAB;  Service: Cardiovascular;  Laterality: N/A;   PERIPHERAL VASCULAR THROMBECTOMY N/A 02/07/2021   Procedure: PERIPHERAL VASCULAR THROMBECTOMY;  Surgeon: [name], MD;  Location: [place] [facility] CV LAB;  Service: Cardiovascular;  Laterality: N/A;   RIGHT/LEFT HEART CATH AND CORONARY ANGIOGRAPHY N/A 06/19/2021   Procedure: RIGHT/LEFT HEART CATH AND CORONARY ANGIOGRAPHY;  Surgeon: Belva Crome, MD;  Location: [place] CV LAB;  Service: Cardiovascular;  Laterality: N/A;    Current Medications: No outpatient medications have been marked as taking for the 12/20/21 encounter (Appointment) with Cynethia Schindler, Yetta Barre, MD.     Allergies:   Sulfa antibiotics   Social History   Socioeconomic History   Marital status:  Widowed    Spouse name: Not on file   Number of children: Not on file   Years of education: Not on file   Highest education level: Not on file  Occupational History   Not on file  Tobacco Use   Smoking status: Some Days    Packs/day: 0.25    Years: 18.00    Total pack years: 4.50    Types: Cigarettes   Smokeless tobacco: Never   Tobacco comments:    smokes 3 cig a day ARJ 02/15/21  Vaping Use   Vaping Use: Never used  Substance and Sexual Activity   Alcohol use: Yes    Alcohol/week: 2.0 standard drinks of  alcohol    Types: 2 Cans of beer per week    Comment: twice a month   Drug use: No   Sexual activity: Not Currently    Birth control/protection: Pill, None  Other Topics Concern   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    Has a 45 year old [place].   Live with parents.   Was working 5 days a weeks.   Not working right now.    Attends church.    Social Determinants of Health   Financial Resource Strain: Not on file  Food Insecurity: Not on file  Transportation Needs: Not on file  Physical Activity: Not on file  Stress: Not on file  Social Connections: Not on file     Family History: The patient's family history includes Cancer in her maternal grandmother; Diabetes in her paternal aunt and paternal uncle; Heart attack in her paternal grandmother; Hypertension in her mother; Other in her father. There is no history of Colon cancer.  ROS:   Please see the history of present illness.    All other systems reviewed and are negative.  EKGs/Labs/Other Studies Reviewed Today:     30 Day monitor 11/2021   30 day monitor. Data available from 75% of planned monitored time   Min HR 61, Max HR 159, Avg HR 81   Symptoms correlated with sinus rhythm, rare PVCs.   Short runs of SVT at times with aberrancy without symptoms reported. Brief NSVT no reported symptoms Frequent aberrancy  EKG:  Last EKG results: today-  sinus. IRBBB, lateral infarct   Recent Labs: 09/22/2021: B Natriuretic Peptide 1,362.0 11/14/2021: ALT 29; Magnesium 1.9; TSH 3.122 12/16/2021: BUN 16; Creatinine, Ser 0.93; Hemoglobin 8.4; Platelets 240; Potassium 4.3; Sodium 132     Physical Exam:    VS:  LMP 11/29/2021     Wt Readings from Last 3 Encounters:  12/16/21 273 lb (123.8 kg)  11/15/21 298 lb 3.2 oz (135.3 kg)  11/13/21 298 lb 3.2 oz (135.3 kg)     GEN:  Well nourished, well developed in no acute distress, obese CARDIAC: RRR, no murmurs,  rubs, gallops RESPIRATORY:  Normal work of breathing MUSCULOSKELETAL: slight dependent edema Right breast is markedly larger than left.   ASSESSMENT & PLAN:    Syncope: in setting of gastritis, very likely vasovagal.  NSVT: continue routine medical management of CHF. CHFrEF: now on metoprolol and losartan, spironolactone. Repeat TTE after January 25. If EF remains < 35% would recommend placing defibrillator.      Medication Adjustments/Labs and Tests Ordered: Current medicines are reviewed at length with the patient today.  Concerns regarding medicines are outlined above.  No orders of the defined types were placed in this  encounter.  No orders of the defined types were placed in this encounter.    Signed, Melida Quitter, MD  12/20/2021 7:35 AM    [place]

## 2021-12-20 NOTE — Telephone Encounter (Signed)
Patient called to inform the nurse or doctor that her oxygen tanks have still not been picked up.  She stated that the company has a new fax #, it is [phone].  Please confirm with the patient that the order was sent to pick up the oxygen tanks.  CB# [phone]

## 2021-12-21 NOTE — Telephone Encounter (Signed)
Message received from [place] states order has been dispatched and the delivery driver will contact pt to set up a pick up time.

## 2021-12-21 NOTE — Telephone Encounter (Addendum)
The last order I see for pick up is from Jan '23.  I called Jasmine at [employer] and she states she sees a ticket from 11/3 and has not been processed yet.  She is going to reach out to intake processing and states someone will call pt today.  I called pt to make her aware she will be receiving a call from them and had to leave her a vm.  Left her my phone # if she has any questions for [place].  Nothing further needed on this message.

## 2021-12-25 ENCOUNTER — Ambulatory Visit: Payer: Medicare HMO | Attending: Cardiology | Admitting: *Deleted

## 2021-12-25 DIAGNOSIS — Z5181 Encounter for therapeutic drug level monitoring: Secondary | ICD-10-CM

## 2021-12-25 DIAGNOSIS — Z86711 Personal history of pulmonary embolism: Secondary | ICD-10-CM

## 2021-12-25 DIAGNOSIS — I2782 Chronic pulmonary embolism: Secondary | ICD-10-CM

## 2021-12-25 DIAGNOSIS — I2602 Saddle embolus of pulmonary artery with acute cor pulmonale: Secondary | ICD-10-CM

## 2021-12-25 LAB — POCT INR: INR: 1.9 — AB (ref 2.0–3.0)

## 2021-12-25 NOTE — Patient Instructions (Signed)
Continue warfarin 1/2 tablet daily except 1 tablet on Mondays Recheck in 3 wks

## 2021-12-28 ENCOUNTER — Inpatient Hospital Stay (HOSPITAL_COMMUNITY)
Admission: EM | Admit: 2021-12-28 | Discharge: 2022-01-11 | DRG: 600 | Disposition: A | Discharge status: Home / Self Care | Payer: Medicare HMO | Attending: Internal Medicine | Admitting: Internal Medicine

## 2021-12-28 ENCOUNTER — Encounter (HOSPITAL_COMMUNITY): Payer: Self-pay | Admitting: *Deleted

## 2021-12-28 ENCOUNTER — Encounter (HOSPITAL_COMMUNITY): Payer: Self-pay | Admitting: Hematology

## 2021-12-28 ENCOUNTER — Other Ambulatory Visit: Payer: Self-pay

## 2021-12-28 DIAGNOSIS — N920 Excessive and frequent menstruation with regular cycle: Secondary | ICD-10-CM | POA: Diagnosis not present

## 2021-12-28 DIAGNOSIS — I5023 Acute on chronic systolic (congestive) heart failure: Secondary | ICD-10-CM | POA: Diagnosis not present

## 2021-12-28 DIAGNOSIS — F17210 Nicotine dependence, cigarettes, uncomplicated: Secondary | ICD-10-CM | POA: Diagnosis not present

## 2021-12-28 DIAGNOSIS — Z7901 Long term (current) use of anticoagulants: Secondary | ICD-10-CM

## 2021-12-28 DIAGNOSIS — Z6841 Body Mass Index (BMI) 40.0 and over, adult: Secondary | ICD-10-CM

## 2021-12-28 DIAGNOSIS — D509 Iron deficiency anemia, unspecified: Secondary | ICD-10-CM | POA: Diagnosis not present

## 2021-12-28 DIAGNOSIS — N6310 Unspecified lump in the right breast, unspecified quadrant: Secondary | ICD-10-CM | POA: Diagnosis not present

## 2021-12-28 DIAGNOSIS — Z95828 Presence of other vascular implants and grafts: Secondary | ICD-10-CM

## 2021-12-28 DIAGNOSIS — Z8249 Family history of ischemic heart disease and other diseases of the circulatory system: Secondary | ICD-10-CM

## 2021-12-28 DIAGNOSIS — Z79899 Other long term (current) drug therapy: Secondary | ICD-10-CM | POA: Diagnosis not present

## 2021-12-28 DIAGNOSIS — Z86711 Personal history of pulmonary embolism: Secondary | ICD-10-CM | POA: Diagnosis not present

## 2021-12-28 DIAGNOSIS — G4733 Obstructive sleep apnea (adult) (pediatric): Secondary | ICD-10-CM | POA: Diagnosis present

## 2021-12-28 DIAGNOSIS — I110 Hypertensive heart disease with heart failure: Secondary | ICD-10-CM | POA: Diagnosis not present

## 2021-12-28 DIAGNOSIS — I959 Hypotension, unspecified: Secondary | ICD-10-CM | POA: Diagnosis present

## 2021-12-28 DIAGNOSIS — I5043 Acute on chronic combined systolic (congestive) and diastolic (congestive) heart failure: Secondary | ICD-10-CM | POA: Diagnosis present

## 2021-12-28 DIAGNOSIS — R69 Illness, unspecified: Secondary | ICD-10-CM | POA: Diagnosis not present

## 2021-12-28 DIAGNOSIS — Z809 Family history of malignant neoplasm, unspecified: Secondary | ICD-10-CM | POA: Diagnosis not present

## 2021-12-28 DIAGNOSIS — I2699 Other pulmonary embolism without acute cor pulmonale: Secondary | ICD-10-CM | POA: Diagnosis not present

## 2021-12-28 DIAGNOSIS — Z86718 Personal history of other venous thrombosis and embolism: Secondary | ICD-10-CM

## 2021-12-28 DIAGNOSIS — N611 Abscess of the breast and nipple: Secondary | ICD-10-CM | POA: Diagnosis not present

## 2021-12-28 DIAGNOSIS — D649 Anemia, unspecified: Secondary | ICD-10-CM | POA: Diagnosis not present

## 2021-12-28 DIAGNOSIS — I890 Lymphedema, not elsewhere classified: Secondary | ICD-10-CM | POA: Diagnosis present

## 2021-12-28 DIAGNOSIS — I272 Pulmonary hypertension, unspecified: Secondary | ICD-10-CM | POA: Diagnosis present

## 2021-12-28 DIAGNOSIS — E876 Hypokalemia: Secondary | ICD-10-CM | POA: Diagnosis not present

## 2021-12-28 DIAGNOSIS — N644 Mastodynia: Secondary | ICD-10-CM | POA: Diagnosis not present

## 2021-12-28 DIAGNOSIS — M79652 Pain in left thigh: Secondary | ICD-10-CM | POA: Diagnosis present

## 2021-12-28 DIAGNOSIS — I340 Nonrheumatic mitral (valve) insufficiency: Secondary | ICD-10-CM | POA: Diagnosis not present

## 2021-12-28 DIAGNOSIS — N6011 Diffuse cystic mastopathy of right breast: Secondary | ICD-10-CM | POA: Diagnosis not present

## 2021-12-28 DIAGNOSIS — N61 Mastitis without abscess: Secondary | ICD-10-CM | POA: Diagnosis not present

## 2021-12-28 DIAGNOSIS — Z833 Family history of diabetes mellitus: Secondary | ICD-10-CM

## 2021-12-28 DIAGNOSIS — I428 Other cardiomyopathies: Secondary | ICD-10-CM | POA: Diagnosis present

## 2021-12-28 DIAGNOSIS — I255 Ischemic cardiomyopathy: Secondary | ICD-10-CM | POA: Diagnosis present

## 2021-12-28 DIAGNOSIS — Z79891 Long term (current) use of opiate analgesic: Secondary | ICD-10-CM

## 2021-12-28 DIAGNOSIS — G894 Chronic pain syndrome: Secondary | ICD-10-CM | POA: Diagnosis not present

## 2021-12-28 DIAGNOSIS — I2510 Atherosclerotic heart disease of native coronary artery without angina pectoris: Secondary | ICD-10-CM | POA: Diagnosis present

## 2021-12-28 DIAGNOSIS — R600 Localized edema: Secondary | ICD-10-CM | POA: Diagnosis not present

## 2021-12-28 DIAGNOSIS — I10 Essential (primary) hypertension: Secondary | ICD-10-CM | POA: Diagnosis not present

## 2021-12-28 DIAGNOSIS — N63 Unspecified lump in unspecified breast: Principal | ICD-10-CM

## 2021-12-28 DIAGNOSIS — D508 Other iron deficiency anemias: Secondary | ICD-10-CM | POA: Diagnosis not present

## 2021-12-28 DIAGNOSIS — E66813 Obesity, class 3: Secondary | ICD-10-CM | POA: Diagnosis present

## 2021-12-28 DIAGNOSIS — I5021 Acute systolic (congestive) heart failure: Secondary | ICD-10-CM | POA: Diagnosis not present

## 2021-12-28 LAB — BASIC METABOLIC PANEL
Anion gap: 9 (ref 5–15)
BUN: 17 mg/dL (ref 6–20)
CO2: 28 mmol/L (ref 22–32)
Calcium: 8.8 mg/dL — ABNORMAL LOW (ref 8.9–10.3)
Chloride: 103 mmol/L (ref 98–111)
Creatinine, Ser: 0.96 mg/dL (ref 0.44–1.00)
GFR, Estimated: >60 mL/min (ref >60)
Glucose, Bld: 89 mg/dL (ref 70–99)
Potassium: 4.5 mmol/L (ref 3.5–5.1)
Sodium: 140 mmol/L (ref 135–145)

## 2021-12-28 LAB — CBC WITH DIFFERENTIAL/PLATELET
Abs Immature Granulocytes: 0.02 10*3/uL (ref 0.00–0.07)
Basophils Absolute: 0.1 10*3/uL (ref 0.0–0.1)
Basophils Relative: 1 %
Eosinophils Absolute: 0.1 10*3/uL (ref 0.0–0.5)
Eosinophils Relative: 2 %
HCT: 31.7 % — ABNORMAL LOW (ref 36.0–46.0)
Hemoglobin: 8.7 g/dL — ABNORMAL LOW (ref 12.0–15.0)
Immature Granulocytes: 0 %
Lymphocytes Relative: 11 %
Lymphs Abs: 0.9 10*3/uL (ref 0.7–4.0)
MCH: 19.6 pg — ABNORMAL LOW (ref 26.0–34.0)
MCHC: 27.4 g/dL — ABNORMAL LOW (ref 30.0–36.0)
MCV: 71.4 fL — ABNORMAL LOW (ref 80.0–100.0)
Monocytes Absolute: 0.5 10*3/uL (ref 0.1–1.0)
Monocytes Relative: 7 %
Neutro Abs: 6.5 10*3/uL (ref 1.7–7.7)
Neutrophils Relative %: 79 %
Platelets: 275 10*3/uL (ref 150–400)
RBC: 4.44 MIL/uL (ref 3.87–5.11)
RDW: 23 % — ABNORMAL HIGH (ref 11.5–15.5)
WBC: 8.1 10*3/uL (ref 4.0–10.5)
nRBC: 0 % (ref 0.0–0.2)

## 2021-12-28 LAB — LACTIC ACID, PLASMA
Lactic Acid, Venous: 2.2 mmol/L (ref 0.5–1.9)
Lactic Acid, Venous: 1.9 mmol/L (ref 0.5–1.9)

## 2021-12-28 MED ORDER — FUROSEMIDE 40 MG PO TABS
80.0000 mg | ORAL_TABLET | Freq: Every day | ORAL | Status: DC
  Administered 2021-12-28 – 2022-01-03 (×7): 80 mg via ORAL
  Filled 2021-12-28 (×7): qty 2

## 2021-12-28 MED ORDER — OXYCODONE-ACETAMINOPHEN 5-325 MG PO TABS: 1.0000 | ORAL_TABLET | Freq: Once | ORAL | Status: DC

## 2021-12-28 MED ORDER — VANCOMYCIN HCL IN DEXTROSE 1-5 GM/200ML-% IV SOLN
1000.0000 mg | Freq: Once | INTRAVENOUS | Status: AC
  Administered 2021-12-28: 1000 mg via INTRAVENOUS
  Filled 2021-12-28: qty 200

## 2021-12-28 MED ORDER — MAGNESIUM OXIDE -MG SUPPLEMENT 400 (240 MG) MG PO TABS
400.0000 mg | ORAL_TABLET | Freq: Every morning | ORAL | Status: DC
  Administered 2021-12-29 – 2021-12-31 (×3): 400 mg via ORAL
  Filled 2021-12-28 (×3): qty 1

## 2021-12-28 MED ORDER — SODIUM CHLORIDE 0.9 % IV BOLUS
1000.0000 mL | Freq: Once | INTRAVENOUS | Status: AC
  Administered 2021-12-28: 1000 mL via INTRAVENOUS

## 2021-12-28 MED ORDER — ACETAMINOPHEN 650 MG RE SUPP: 650.0000 mg | Freq: Four times a day (QID) | RECTAL | Status: DC | PRN

## 2021-12-28 MED ORDER — SPIRONOLACTONE 12.5 MG HALF TABLET
12.5000 mg | ORAL_TABLET | Freq: Every day | ORAL | Status: DC
  Administered 2021-12-29 – 2022-01-11 (×14): 12.5 mg via ORAL
  Filled 2021-12-28 (×14): qty 1

## 2021-12-28 MED ORDER — ATORVASTATIN CALCIUM 10 MG PO TABS
20.0000 mg | ORAL_TABLET | Freq: Every day | ORAL | Status: DC
  Administered 2021-12-29 – 2022-01-11 (×14): 20 mg via ORAL
  Filled 2021-12-28 (×14): qty 2

## 2021-12-28 MED ORDER — METOPROLOL SUCCINATE ER 50 MG PO TB24
50.0000 mg | ORAL_TABLET | Freq: Every day | ORAL | Status: DC
  Administered 2021-12-29 – 2022-01-01 (×4): 50 mg via ORAL
  Filled 2021-12-28 (×6): qty 1

## 2021-12-28 MED ORDER — POTASSIUM CHLORIDE CRYS ER 10 MEQ PO TBCR
10.0000 meq | EXTENDED_RELEASE_TABLET | Freq: Every morning | ORAL | Status: DC
  Administered 2021-12-29 – 2022-01-06 (×9): 10 meq via ORAL
  Filled 2021-12-28 (×14): qty 1

## 2021-12-28 MED ORDER — LOSARTAN POTASSIUM 50 MG PO TABS
25.0000 mg | ORAL_TABLET | Freq: Every day | ORAL | Status: DC
  Administered 2021-12-29 – 2022-01-01 (×4): 25 mg via ORAL
  Filled 2021-12-28 (×6): qty 1

## 2021-12-28 MED ORDER — HYDROMORPHONE HCL 1 MG/ML IJ SOLN
1.0000 mg | Freq: Once | INTRAMUSCULAR | Status: AC
  Administered 2021-12-28: 1 mg via INTRAVENOUS
  Filled 2021-12-28: qty 1

## 2021-12-28 MED ORDER — FUROSEMIDE 10 MG/ML IJ SOLN
40.0000 mg | Freq: Once | INTRAMUSCULAR | Status: AC
  Administered 2021-12-29: 40 mg via INTRAVENOUS
  Filled 2021-12-28: qty 4

## 2021-12-28 MED ORDER — SODIUM CHLORIDE 0.9% IV SOLUTION: Freq: Once | INTRAVENOUS | Status: AC

## 2021-12-28 MED ORDER — ALBUTEROL SULFATE (2.5 MG/3ML) 0.083% IN NEBU: 2.5000 mg | INHALATION_SOLUTION | RESPIRATORY_TRACT | Status: DC | PRN

## 2021-12-28 MED ORDER — OXYCODONE HCL 5 MG PO TABS
5.0000 mg | ORAL_TABLET | ORAL | Status: DC | PRN
  Administered 2021-12-28 – 2022-01-11 (×60): 5 mg via ORAL
  Filled 2021-12-28 (×63): qty 1

## 2021-12-28 MED ORDER — ACETAMINOPHEN 325 MG PO TABS
650.0000 mg | ORAL_TABLET | Freq: Four times a day (QID) | ORAL | Status: DC | PRN
  Administered 2021-12-29 – 2022-01-10 (×14): 650 mg via ORAL
  Filled 2021-12-28 (×15): qty 2

## 2021-12-28 NOTE — H&P (Signed)
TRH H&P   Patient Demographics:    [name] [name], is a 46 y.o. female  MRN: [id number]   DOB - 01-06-1976  Admit Date - 12/28/2021  Outpatient Primary MD for the patient is Fanta, Normajean Baxter, MD  Referring MD/NP/PA: PA [place]  Patient coming from: home  Chief Complaint  Patient presents with   Breast Pain      HPI:    [name] [name]  is a 46 y.o. female, known history of DVT PE 2015 IVC filter prior on Xarelto --- recurrence DVT/PE January 2023 status post thrombectomy on lifelong anticoagulation with Coumadin secondary to weight >250 pounds, OSA on CPAP, Known uterine fibroids, Smoker With recent admission to [facility] in sept 2023 due to syncope, felt to be vasovagal.  CHF history. -Patient presented to the ED secondary to complaints of worsening right breast pain and swelling, patient report around Thanksgiving she started to notice swelling to her right breast, from the position of the areola, he came to ED, where she was discharged on Augmentin, with recommendation to follow-up with her/OB/GYN for further workup, and reports despite being compliant with antibiotic, pain has been worsening, swelling as well, she does report some minimal discharge as well, and had a mammogram last year, where she had abnormal findings for which she followed with fine-needle aspiration of the breast and right axilla with no malignancy could be found (as well she had needle aspiration of the left breast in 2020 with no malignancy), there is no personal or familial history of breast cancer - in ED her lactic acid was elevated at 2.2, then resolved to 1.9, baseline anemia with hemoglobin of 8.7, was started on IV vancomycin, and IR was 1.9, ED physician discussed with oncology and surgery who recommended admission for further workup.    Review of systems:     A full 10 point Review of  Systems was done, except as stated above, all other Review of Systems were negative.   With Past History of the following :    Past Medical History:  Diagnosis Date   Abdominal cramps 08/30/2015   Anemia    Blood transfusion without reported diagnosis    CAD (coronary artery disease)    Chronic combined systolic and diastolic CHF (congestive heart failure) (HCC)    Closed left ankle fracture    August 30 2012   Clotting disorder [facility])    DVT (deep venous thrombosis) (HCC)    L leg   Fibroid tumor    Hidradenitis    High blood pressure    Hypertension    Mitral regurgitation    NICM (nonischemic cardiomyopathy) (HCC)    NSVT (nonsustained ventricular tachycardia) (HCC)    Obesity    OSA on CPAP    Oxygen deficiency    Pregnancy    09/20/14 17 weeks   Pulmonary embolism ([place]) 04/2013   Sleep apnea  Tobacco use disorder       Past Surgical History:  Procedure Laterality Date   ABSCESS DRAINAGE Bilateral 01/10/15   COLONOSCOPY WITH PROPOFOL N/A 06/08/2018   Procedure: COLONOSCOPY WITH PROPOFOL;  Surgeon: Daneil Dolin, MD;  [facility];  Service: Endoscopy;  Laterality: N/A;  2:30pm   CYST REMOVAL TRUNK     IVC FILTER PLACEMENT ([place] HX)     LOWER EXTREMITY VENOGRAPHY N/A 02/07/2021   Procedure: LOWER EXTREMITY VENOGRAPHY;  Surgeon: [name], MD;  Location: [place] CV LAB;  Service: Cardiovascular;  Laterality: N/A;   PERIPHERAL VASCULAR THROMBECTOMY N/A 02/07/2021   Procedure: PERIPHERAL VASCULAR THROMBECTOMY;  Surgeon: [name], MD;  Location: [place] CV LAB;  Service: Cardiovascular;  Laterality: N/A;   RIGHT/LEFT HEART CATH AND CORONARY ANGIOGRAPHY N/A 06/19/2021   Procedure: RIGHT/LEFT HEART CATH AND CORONARY ANGIOGRAPHY;  Surgeon: Belva Crome, MD;  Location: [place] CV LAB;  Service: Cardiovascular;  Laterality: N/A;      Social History:     Social History   Tobacco Use   Smoking status: Some Days    Packs/day: 0.25     Years: 18.00    Total pack years: 4.50    Types: Cigarettes   Smokeless tobacco: Never   Tobacco comments:    smokes 3 cig a day ARJ 02/15/21  Substance Use Topics   Alcohol use: Yes    Alcohol/week: 2.0 standard drinks of alcohol    Types: 2 Cans of beer per week    Comment: twice a month       Family History :     Family History  Problem Relation Age of Onset   Hypertension Mother    Diabetes Paternal Aunt    Diabetes Paternal Uncle    Other Father        blood clots   Cancer Maternal Grandmother    Heart attack Paternal Grandmother    Colon cancer Neg Hx       Home Medications:   Prior to Admission medications   Medication Sig Start Date End Date Taking? Authorizing Provider  albuterol (VENTOLIN HFA) 108 (90 Base) MCG/ACT inhaler Inhale 2 puffs into the lungs every 2 (two) hours as needed for wheezing or shortness of breath. 09/13/21  Yes Rigoberto Noel, MD  atorvastatin (LIPITOR) 20 MG tablet Take 1 tablet (20 mg total) by mouth daily. Patient taking differently: Take 20 mg by mouth in the morning. 09/10/21  Yes Branch, Alphonse Guild, MD  furosemide (LASIX) 80 MG tablet Take 1 tablet (80 mg total) by mouth daily. Patient taking differently: Take 80 mg by mouth in the morning. 06/23/21  Yes Regalado, Belkys A, MD  losartan (COZAAR) 25 MG tablet Take 1 tablet (25 mg total) by mouth daily. Patient taking differently: Take 25 mg by mouth in the morning. 11/14/21 11/09/22 Yes Dunn, Nedra Hai, PA-C  Magnesium Oxide 400 MG CAPS Take 1 capsule (400 mg total) by mouth daily. Patient taking differently: Take 400 mg by mouth in the morning. 11/14/21  Yes Dunn, Dayna N, PA-C  metoprolol succinate (TOPROL-XL) 50 MG 24 hr tablet Take 1 tablet (50 mg total) by mouth daily. Take with or immediately following a meal. Patient taking differently: Take 50 mg by mouth in the morning. 11/14/21 11/09/22 Yes Dunn, Dayna N, PA-C  potassium chloride (KLOR-CON) 10 MEQ tablet Take 1 tablet (10 mEq  total) by mouth daily. Patient taking differently: Take 10 mEq by mouth in the  morning. 11/14/21 11/09/22 Yes Dunn, Dayna N, PA-C  spironolactone (ALDACTONE) 25 MG tablet Take 0.5 tablets (12.5 mg total) by mouth daily. Patient taking differently: Take 12.5 mg by mouth in the morning. 06/22/21  Yes Regalado, Belkys A, MD  warfarin (COUMADIN) 10 MG tablet Take 5-10 mg by mouth See admin instructions. Take as directed in the evening : Monday take 10 mg and all other days take 5 mg.   Yes [provider]  warfarin (COUMADIN) 5 MG tablet TAKE (1) OR (1) 1/2 TABLET BY MOUTH DAILY AS DIRECTED. 09/25/21  Yes Nita Sells, MD  amoxicillin-clavulanate (AUGMENTIN) 875-125 MG tablet Take 1 tablet by mouth every 12 (twelve) hours. Patient not taking: Reported on 12/28/2021 12/16/21   Kem Parkinson, PA-C  HYDROcodone-acetaminophen (NORCO/VICODIN) 5-325 MG tablet Take one tab po q 4 hrs prn pain Patient not taking: Reported on 12/28/2021 12/16/21   Kem Parkinson, PA-C     Allergies:     Allergies  Allergen Reactions   Sulfa Antibiotics Itching, Swelling and Rash    Lip swelling     Physical Exam:   Vitals  Blood pressure 130/83, pulse 74, temperature 98.6 F (37 C), temperature source Oral, resp. rate 19, height '5\' 11"'$  (1.803 m), weight (!) 149.2 kg, last menstrual period 11/29/2021, SpO2 95 %.   1. General well female, laying in bed, no apparent distress  2. Normal affect and insight, Not Suicidal or Homicidal, Awake Alert, Oriented X 3.  3. No F.N deficits, ALL C.Nerves Intact, Strength 5/5 all 4 extremities, Sensation intact all 4 extremities, Plantars down going.  4. Ears and Eyes appear Normal, Conjunctivae clear, PERRLA. Moist Oral Mucosa.  5. Supple Neck, No JVD, No cervical lymphadenopathy appriciated, No Carotid Bruits.  6. Symmetrical Chest wall movement, Good air movement bilaterally, CTAB.  7. RRR, No Gallops, Rubs or Murmurs, No Parasternal Heave.  8. Positive  Bowel Sounds, Abdomen Soft, No tenderness, No organomegaly appriciated,No rebound -guarding or rigidity.  9.  No Cyanosis, Normal Skin Turgor, No Skin Rash or Bruise.  10. Good muscle tone,  joints appear normal , no effusions, Normal ROM.  Breast were examined with the presence of female chaperone at bedside, CNA  Danielle .  Breast much swollen, at least 3 times larger than the left breast, with erythema and tenderness to palpation please see picture documented by ED physician   Data Review:    CBC Recent Labs  Lab 12/28/21 1330  WBC 8.1  HGB 8.7*  HCT 31.7*  PLT 275  MCV 71.4*  MCH 19.6*  MCHC 27.4*  RDW 23.0*  LYMPHSABS 0.9  MONOABS 0.5  EOSABS 0.1  BASOSABS 0.1   ------------------------------------------------------------------------------------------------------------------  Chemistries  Recent Labs  Lab 12/28/21 1330  NA 140  K 4.5  CL 103  CO2 28  GLUCOSE 89  BUN 17  CREATININE 0.96  CALCIUM 8.8*   ------------------------------------------------------------------------------------------------------------------ estimated creatinine clearance is 119.4 mL/min (by C-G formula based on SCr of 0.96 mg/dL). ------------------------------------------------------------------------------------------------------------------ No results for input(s): "TSH", "T4TOTAL", "T3FREE", "THYROIDAB" in the last 72 hours.  Invalid input(s): "FREET3"  Coagulation profile Recent Labs  Lab 12/25/21 0814  INR 1.9*   ------------------------------------------------------------------------------------------------------------------- No results for input(s): "DDIMER" in the last 72 hours. -------------------------------------------------------------------------------------------------------------------  Cardiac Enzymes No results for input(s): "CKMB", "TROPONINI", "MYOGLOBIN" in the last 168 hours.  Invalid input(s):  "CK" ------------------------------------------------------------------------------------------------------------------    Component Value Date/Time   BNP 1,362.0 (H) 09/22/2021 1543     ---------------------------------------------------------------------------------------------------------------  Urinalysis    Component Value  Date/Time   COLORURINE YELLOW 09/22/2021 1506   APPEARANCEUR HAZY (A) 09/22/2021 1506   APPEARANCEUR Cloudy (A) 07/21/2014 1326   LABSPEC 1.008 09/22/2021 1506   PHURINE 6.0 09/22/2021 1506   GLUCOSEU NEGATIVE 09/22/2021 1506   HGBUR NEGATIVE 09/22/2021 1506   BILIRUBINUR NEGATIVE 09/22/2021 1506   BILIRUBINUR Negative 07/21/2014 1326   KETONESUR NEGATIVE 09/22/2021 1506   PROTEINUR NEGATIVE 09/22/2021 1506   UROBILINOGEN 0.2 06/30/2014 0910   NITRITE NEGATIVE 09/22/2021 1506   LEUKOCYTESUR TRACE (A) 09/22/2021 1506    ----------------------------------------------------------------------------------------------------------------   Imaging Results:    No results found.     Assessment & Plan:    Principal Problem:   Cellulitis of breast Active Problems:   Benign essential hypertension   Bilateral pulmonary embolism (HCC)  Cellulitis of left breast -With worsening left breast cellulitis despite being compliant with antibiotics, it is a concern of pulm malignancy, especially with abnormal mammogram last year despite negative biopsy. -Was discussed with Dr. Oneida Arenas of and general surgery DR Pappayliou , for now she will need punch biopsy to be performed tomorrow by general surgery at bedside. -Continue with IV vancomycin. -Further imaging and deeper tissue sampling likely will need to happen, but this can be done in outpatient setting.  DVT/PE Chronic anticoagulation -INR is 1.9, will hold warfarin in anticipation for procedure tomorrow, will give FFP overnight to get her INR<1.5(will try to avoid vitamin K as be difficult to her INR  appropriate once back on warfarin).  Chronic systolic/diastolic CHF - Echocardiogram from 08/23/2021 showed EF dropped to 30 to 35% from 45-50 % back on Jun 05, 2021 -This echo also showed moderate to severe mitral regurg that was directed posteriorly, -No aortic stenosis was noted -Grade 3 diastolic dysfunction was noted (restrictive pattern) -This echo also showed severe dilatation of the left ventricular internal cavity with LVIDd..  With worsening pulmonary artery pressures (Compared to prior TTE on 05/2021, the EF has dropped to  30-35% from 45-50%, the LV is now severely enlarged, there is now at least  moderate MR (previously mild) and the filling pressures are significantly  elevated. ) -Continue with Lasix, Aldactone, Toprol and losartan -Appears to be a euvolemic   Anemia -  Hemoglobin at baseline   obesity/OSA/pulmonary hypertension - continue CPAP nightly    DVT Prophylaxis  on wrfatin INR 1.9  AM Labs Ordered, also please review Full Orders  Family Communication: Admission, patients condition and plan of care including tests being ordered have been discussed with the patient and family who indicate understanding and agree with the plan and Code Status.  Code Status full  Likely DC to  home  Condition GUARDED    Consults called: general surgery    Admission status: inpatient    Time spent in minutes : 70 minutes   Phillips Climes M.D on 12/28/2021 at 4:57 PM   [facility] - Office  [phone]

## 2021-12-28 NOTE — ED Provider Notes (Signed)
[facility] EMERGENCY DEPARTMENT Provider Note   CSN: [id number] Arrival date & time: 12/28/21  4970     History  Chief Complaint  Patient presents with   Breast Pain    [name] [name] is a 46 y.o. female with a past medical history of CHF presenting today due to breast pain and swelling.  She reports that around Thanksgiving she started to notice swelling to the right breast.  She says that this happens often and it is usually at the 5 o'clock position of her areola.  She says that it continued to expand and become swollen so she presented to the emergency room a week and a half ago.  She was given antibiotics in the ED and outpatient but she says it keeps getting bigger and more painful.  Tells me that she had an ultrasound 2 years ago that had a cyst that was biopsied and benign.  Also endorsing some drainage from her nipple.  No personal or familial history of breast cancer.    HPI     Home Medications Prior to Admission medications   Medication Sig Start Date End Date Taking? Authorizing Provider  albuterol (VENTOLIN HFA) 108 (90 Base) MCG/ACT inhaler Inhale 2 puffs into the lungs every 2 (two) hours as needed for wheezing or shortness of breath. 09/13/21   Rigoberto Noel, MD  amoxicillin-clavulanate (AUGMENTIN) 875-125 MG tablet Take 1 tablet by mouth every 12 (twelve) hours. 12/16/21   Triplett, Tammy, PA-C  atorvastatin (LIPITOR) 20 MG tablet Take 1 tablet (20 mg total) by mouth daily. Patient taking differently: Take 20 mg by mouth in the morning. 09/10/21   Arnoldo Lenis, MD  furosemide (LASIX) 80 MG tablet Take 1 tablet (80 mg total) by mouth daily. Patient taking differently: Take 80 mg by mouth in the morning. 06/23/21   Regalado, Jerald Kief A, MD  HYDROcodone-acetaminophen (NORCO/VICODIN) 5-325 MG tablet Take one tab po q 4 hrs prn pain 12/16/21   Triplett, Tammy, PA-C  losartan (COZAAR) 25 MG tablet Take 1 tablet (25 mg total) by mouth daily. Patient taking differently:  Take 25 mg by mouth in the morning. 11/14/21 11/09/22  Dunn, Nedra Hai, PA-C  Magnesium Oxide 400 MG CAPS Take 1 capsule (400 mg total) by mouth daily. Patient taking differently: Take 400 mg by mouth in the morning. 11/14/21   Dunn, Nedra Hai, PA-C  metoprolol succinate (TOPROL-XL) 50 MG 24 hr tablet Take 1 tablet (50 mg total) by mouth daily. Take with or immediately following a meal. Patient taking differently: Take 50 mg by mouth in the morning. 11/14/21 11/09/22  Dunn, Nedra Hai, PA-C  potassium chloride (KLOR-CON) 10 MEQ tablet Take 1 tablet (10 mEq total) by mouth daily. Patient taking differently: Take 10 mEq by mouth in the morning. 11/14/21 11/09/22  Dunn, Nedra Hai, PA-C  spironolactone (ALDACTONE) 25 MG tablet Take 0.5 tablets (12.5 mg total) by mouth daily. Patient taking differently: Take 12.5 mg by mouth in the morning. 06/22/21   Regalado, Belkys A, MD  warfarin (COUMADIN) 10 MG tablet Take 5-10 mg by mouth See admin instructions. Take as directed in the evening : 10 mg on Sunday and Thursday. 5 mg all other days.    [provider]  warfarin (COUMADIN) 5 MG tablet TAKE (1) OR (1) 1/2 TABLET BY MOUTH DAILY AS DIRECTED. Patient not taking: Reported on 12/16/2021 09/25/21   Nita Sells, MD      Allergies    Sulfa antibiotics    Review  of Systems   Review of Systems  Physical Exam Updated Vital Signs BP (!) 145/123 (BP Location: Right Arm)   Pulse 73   Temp 99.4 F (37.4 C) (Oral)   Resp 18   Ht '5\' 11"'$  (1.803 m)   Wt (!) 149.2 kg   LMP 11/29/2021   SpO2 100%   BMI 45.88 kg/m  Physical Exam Vitals and nursing note reviewed.  Constitutional:      Appearance: Normal appearance.  HENT:     Head: Normocephalic and atraumatic.  Eyes:     General: No scleral icterus.    Conjunctiva/sclera: Conjunctivae normal.  Pulmonary:     Effort: Pulmonary effort is normal. No respiratory distress.  Skin:    Findings: No rash.     Comments: Extremely swollen and indurated  right breast.  Some purulent drainage just outside of the areola at the 6 o'clock position.  Erythematous and warm.  Neurological:     Mental Status: She is alert.  Psychiatric:        Mood and Affect: Mood normal.            ED Results / Procedures / Treatments   Labs (all labs ordered are listed, but only abnormal results are displayed) Labs Reviewed  CBC WITH DIFFERENTIAL/PLATELET - Abnormal; Notable for the following components:      Result Value   Hemoglobin 8.7 (*)    HCT 31.7 (*)    MCV 71.4 (*)    MCH 19.6 (*)    MCHC 27.4 (*)    RDW 23.0 (*)    All other components within normal limits  BASIC METABOLIC PANEL - Abnormal; Notable for the following components:   Calcium 8.8 (*)    All other components within normal limits  LACTIC ACID, PLASMA - Abnormal; Notable for the following components:   Lactic Acid, Venous 2.2 (*)    All other components within normal limits  LACTIC ACID, PLASMA    EKG None  Radiology No results found.  Procedures Procedures   Medications Ordered in ED Medications - No data to display  ED Course/ Medical Decision Making/ A&P Clinical Course as of 12/28/21 1533  Fri Dec 28, 2021  1525 [phone] [MR]    Clinical Course User Index [MR] Keymoni Mccaster, Cecilio Asper, PA-C                           Medical Decision Making Amount and/or Complexity of Data Reviewed Labs: ordered.  Risk Prescription drug management. Decision regarding hospitalization.   46 year old bounce backThis is not an exhaustive differential.    Past Medical History / Co-morbidities / Social History: Abnormal mammogram 2 years ago with ultrasound-guided biopsy without signs of malignancy.   Additional history: I reviewed patient's visit from November 26.  CT scan concerning for malignancy versus mastitis.  She was sent home with Augmentin after receiving a Rocephin dose in the ED and has failed this outpatient treatment.  I also reviewed her breast biopsy in  2021 which was benign.  Physical Exam: Pertinent physical exam findings include Very large and indurated breast Right axillary lymphadenopathy  Lab Tests: I ordered, and personally interpreted labs.  The pertinent results include: 2.2 lactic     Medications: I ordered medication including Dilaudid and vancomycin.    Consultations Obtained: I spoke with Dr. Federico Flake with oncology who recommends admission for surgical and oncology consult.  I also spoke with Dr. Okey Dupre with general surgery will see  the patient to establish care during the hospitalization.    MDM/Disposition: This is a 46 year old female presenting with right breast pain and swelling.  Mastitis versus inflammatory breast cancer.  I have a high suspicion for inflammatory breast cancer due to patient's presentation, recurrent cyst in the right breast and history.  I spoke with both surgery and oncology who are agreeable to see establishing care and treating this patient.  She is agreeable to admission.  Admit to Dr. Waldron Labs.    I discussed this case with my attending physician Dr. Rogene Houston who cosigned this note including patient's presenting symptoms, physical exam, and planned diagnostics and interventions. Attending physician stated agreement with plan or made changes to plan which were implemented.     Final Clinical Impression(s) / ED Diagnoses Final diagnoses:  Breast swelling    Rx / DC Orders ED Discharge Orders     None      Admit to Dr. Waldron Labs.   Rhae Hammock, PA-C 12/28/21 1611    Fredia Sorrow, MD 01/05/22 1720

## 2021-12-28 NOTE — ED Provider Triage Note (Signed)
Emergency Medicine Provider Triage Evaluation Note  [name] [name] , a 46 y.o. female  was evaluated in triage.  Pt complains of right breast pain and swelling.  Was seen for this a little more than a week ago and was referred for outpatient ultrasound after CT scan concerning for malignancy versus mastitis.  She says that she finished antibiotics but continues to swell and have pain.  Also endorsing some discharge from her nipple.  Review of Systems  Positive:  Negative:   Physical Exam  BP 121/84 (BP Location: Right Arm)   Pulse 71   Temp 99.4 F (37.4 C) (Oral)   Resp 18   Ht '5\' 11"'$  (1.803 m)   Wt (!) 149.2 kg   LMP 11/29/2021   SpO2 99%   BMI 45.88 kg/m  Gen:   Awake, no distress   Resp:  Normal effort  MSK:   Moves extremities without difficulty  Other:  Erythema and induration of the patient's right breast.  Very limited exam and fast-track area  Medical Decision Making  Medically screening exam initiated at 1:13 PM.  Appropriate orders placed.  SHAYNA EBLEN was informed that the remainder of the evaluation will be completed by another provider, this initial triage assessment does not replace that evaluation, and the importance of remaining in the ED until their evaluation is complete.     Rhae Hammock, PA-C 12/28/21 1314

## 2021-12-28 NOTE — Progress Notes (Signed)
Transported patient up to 300, patient states pain level of 9. Gave pain meds and propped right breast up on pillows which seemed to provide some relief.   Call bell at bedside.

## 2021-12-28 NOTE — ED Triage Notes (Addendum)
Pt was seen here last week for same complaint and given an antibiotic and she states she finished but continues to have pain and swelling  Pt has an appointment with Dr. Elonda Husky on December 19

## 2021-12-28 NOTE — Progress Notes (Addendum)
Discussed with Dr. Waldron Labs that last INR was 1.9 on 12/5 and there is no current INR as of yet.  Per his order, will complete infusing the first unit of FFP and then check INR.  If result is >1.5, will give last 2 units.  If < 1.5, will not give last 2 units.    First unit of FFP infusing and IV has infiltrated.  Pt is a difficult stick and 3 RN's have attempted thus far.  Discussed with Dr. Waldron Labs and will continue to attempt to obtain IV access.  Charge RN [name] Mackie is aware and has been in to see pt also. Ayesha Mohair BSN RN CMSRN 12/28/2021, 10:11 PM  AC Melinda notified of need for IV access.  She stated she would be up to attempt as soon as she was able to. Ayesha Mohair BSN RN [place] 12/28/2021, 10:18 PM  INR 2.1 after 1 unit FFP.  Will administer 2 additional units per MD order.  Lasix '40mg'$  IV given x 1 per order.  Pt has tolerated infusions well once IV was established. Ayesha Mohair BSN RN [place] 12/29/2021, 1:17 AM

## 2021-12-29 DIAGNOSIS — I10 Essential (primary) hypertension: Secondary | ICD-10-CM | POA: Diagnosis not present

## 2021-12-29 DIAGNOSIS — I2699 Other pulmonary embolism without acute cor pulmonale: Secondary | ICD-10-CM | POA: Diagnosis not present

## 2021-12-29 DIAGNOSIS — N61 Mastitis without abscess: Secondary | ICD-10-CM | POA: Diagnosis not present

## 2021-12-29 LAB — BASIC METABOLIC PANEL
Anion gap: 5 (ref 5–15)
BUN: 18 mg/dL (ref 6–20)
CO2: 29 mmol/L (ref 22–32)
Calcium: 8.3 mg/dL — ABNORMAL LOW (ref 8.9–10.3)
Chloride: 102 mmol/L (ref 98–111)
Creatinine, Ser: 0.96 mg/dL (ref 0.44–1.00)
GFR, Estimated: >60 mL/min (ref >60)
Glucose, Bld: 100 mg/dL — ABNORMAL HIGH (ref 70–99)
Potassium: 3.8 mmol/L (ref 3.5–5.1)
Sodium: 136 mmol/L (ref 135–145)

## 2021-12-29 LAB — CBC
HCT: 27.9 % — ABNORMAL LOW (ref 36.0–46.0)
Hemoglobin: 7.5 g/dL — ABNORMAL LOW (ref 12.0–15.0)
MCH: 19.2 pg — ABNORMAL LOW (ref 26.0–34.0)
MCHC: 26.9 g/dL — ABNORMAL LOW (ref 30.0–36.0)
MCV: 71.4 fL — ABNORMAL LOW (ref 80.0–100.0)
Platelets: 224 10*3/uL (ref 150–400)
RBC: 3.91 MIL/uL (ref 3.87–5.11)
RDW: 22.8 % — ABNORMAL HIGH (ref 11.5–15.5)
WBC: 7 10*3/uL (ref 4.0–10.5)
nRBC: 0 % (ref 0.0–0.2)

## 2021-12-29 LAB — PROTIME-INR
INR: 1.6 — ABNORMAL HIGH (ref 0.8–1.2)
INR: 1.8 — ABNORMAL HIGH (ref 0.8–1.2)
INR: 2.1 — ABNORMAL HIGH (ref 0.8–1.2)
Prothrombin Time: 19.2 seconds — ABNORMAL HIGH (ref 11.4–15.2)
Prothrombin Time: 20.8 seconds — ABNORMAL HIGH (ref 11.4–15.2)
Prothrombin Time: 23.2 seconds — ABNORMAL HIGH (ref 11.4–15.2)

## 2021-12-29 MED ORDER — FUROSEMIDE 10 MG/ML IJ SOLN
20.0000 mg | Freq: Once | INTRAMUSCULAR | Status: AC
  Administered 2021-12-29: 20 mg via INTRAVENOUS
  Filled 2021-12-29: qty 2

## 2021-12-29 MED ORDER — LIDOCAINE HCL (PF) 1 % IJ SOLN
INTRAMUSCULAR | Status: AC
  Filled 2021-12-29: qty 5

## 2021-12-29 MED ORDER — VANCOMYCIN HCL 2000 MG/400ML IV SOLN
2000.0000 mg | Freq: Once | INTRAVENOUS | Status: AC
  Administered 2021-12-29: 2000 mg via INTRAVENOUS
  Filled 2021-12-29: qty 400

## 2021-12-29 MED ORDER — VANCOMYCIN HCL 1250 MG/250ML IV SOLN
1250.0000 mg | Freq: Two times a day (BID) | INTRAVENOUS | Status: DC
  Administered 2021-12-29 – 2022-01-08 (×18): 1250 mg via INTRAVENOUS
  Filled 2021-12-29 (×20): qty 250

## 2021-12-29 MED ORDER — LIDOCAINE-EPINEPHRINE (PF) 1 %-1:200000 IJ SOLN
10.0000 mL | Freq: Once | INTRAMUSCULAR | Status: AC
  Administered 2021-12-29: 10 mL via INTRADERMAL
  Filled 2021-12-29: qty 10

## 2021-12-29 MED ORDER — SODIUM CHLORIDE 0.9% IV SOLUTION: Freq: Once | INTRAVENOUS | Status: AC

## 2021-12-29 NOTE — Progress Notes (Signed)
PROGRESS NOTE    [name] [name]  VWU:[id number] DOB: 1975-08-13 DOA: 12/28/2021 PCP: Carrolyn Meiers, MD   Chief Complaint  Patient presents with   Breast Pain    Brief Narrative:    [name] [name]  is a 46 y.o. female, known history of DVT PE 2015 IVC filter prior on Xarelto --- recurrence DVT/PE January 2023 status post thrombectomy on lifelong anticoagulation with Coumadin secondary to weight >250 pounds, OSA on CPAP, Known uterine fibroids, Smoker With recent admission to [facility] in sept 2023 due to syncope, felt to be vasovagal.  CHF history. -Patient presented to the ED secondary to complaints of worsening right breast pain and swelling, simply discharged on oral Augmentin without much improvement, she was admitted for further workup.    Assessment & Plan:   Principal Problem:   Cellulitis of breast Active Problems:   Benign essential hypertension   Bilateral pulmonary embolism (HCC)   Cellulitis of left breast -With worsening left breast cellulitis despite being compliant with antibiotics, it is a concern of pulm malignancy, especially with abnormal mammogram last year despite negative biopsy. -Continue with IV vancomycin given she failed oral Augmentin as an outpatient. -Medical suspicion for malignancy, so general surgery to perform punch biopsy during hospital stay, awaiting for INR<1.5, 50 units FFP yesterday, will give another 2 FFP today given INR is 1.8. -ED discussed with Dr. Oneida Arenas, oncology, to be followed as an outpatient, as likely further imaging and liver biopsy needed in the outpatient setting.     DVT/PE Chronic anticoagulation -INR is 1.8 today after receiving 3 FFP's, will give another 2 hopefully can get it less than 1.5 so can do punch biopsy today - (will try to avoid vitamin K as be difficult to her INR appropriate once back on warfarin).   Chronic systolic/diastolic CHF - Echocardiogram from 08/23/2021 showed EF dropped to 30 to  35% from 45-50 % back on Jun 05, 2021 -This echo also showed moderate to severe mitral regurg that was directed posteriorly, -No aortic stenosis was noted -Grade 3 diastolic dysfunction was noted (restrictive pattern) -This echo also showed severe dilatation of the left ventricular internal cavity with LVIDd..  With worsening pulmonary artery pressures (Compared to prior TTE on 05/2021, the EF has dropped to  30-35% from 45-50%, the LV is now severely enlarged, there is now at least  moderate MR (previously mild) and the filling pressures are significantly  elevated. ) -Continue with Lasix, Aldactone, Toprol and losartan -Appears to be a euvolemic   Anemia of iron deficiency -  Hemoglobin at baseline,      obesity/OSA/pulmonary hypertension - continue CPAP nightly        DVT prophylaxis: on warfarin INR 1.9 Code Status: Full Family Communication: none at bedside Disposition:   Status is: Inpatient    Consultants:  General Surgery   Subjective:  No significant events overnight, she denies any complaints today  Objective: Vitals:   12/29/21 0342 12/29/21 0522 12/29/21 1201 12/29/21 1231  BP: 116/79 115/61 108/64 105/66  Pulse: 79 70 76 74  Resp: '18 18 18 18  '$ Temp: 98.2 F (36.8 C) 98.5 F (36.9 C) 98.4 F (36.9 C) 98.5 F (36.9 C)  TempSrc: Oral Oral Oral Oral  SpO2: 100% 99% 100% 99%  Weight:      Height:        Intake/Output Summary (Last 24 hours) at 12/29/2021 1333 Last data filed at 12/29/2021 1326 Gross per 24 hour  Intake 2446 ml  Output --  Net 2446 ml   Filed Weights   12/28/21 0937  Weight: (!) 149.2 kg    Examination:   Awake Alert, Oriented X 3, No new F.N deficits, Normal affect Symmetrical Chest wall movement, Good air movement bilaterally, CTAB RRR,No Gallops,Rubs or new Murmurs, No Parasternal Heave +ve B.Sounds, Abd Soft, No tenderness, No rebound - guarding or rigidity. No Cyanosis, Clubbing or edema, No new Rash or bruise     Female chaperone CNA Morey Hummingbird present during breast exam -Breast with no significant change from yesterday, remains tender to palpation, with significant enlargement and erythema.  Data Reviewed: I have personally reviewed following labs and imaging studies  CBC: Recent Labs  Lab 12/28/21 1330 12/29/21 0602  WBC 8.1 7.0  NEUTROABS 6.5  --   HGB 8.7* 7.5*  HCT 31.7* 27.9*  MCV 71.4* 71.4*  PLT 275 297    Basic Metabolic Panel: Recent Labs  Lab 12/28/21 1330 12/29/21 0602  NA 140 136  K 4.5 3.8  CL 103 102  CO2 28 29  GLUCOSE 89 100*  BUN 17 18  CREATININE 0.96 0.96  CALCIUM 8.8* 8.3*    GFR: Estimated Creatinine Clearance: 119.4 mL/min (by C-G formula based on SCr of 0.96 mg/dL).  Liver Function Tests: No results for input(s): "AST", "ALT", "ALKPHOS", "BILITOT", "PROT", "ALBUMIN" in the last 168 hours.  CBG: No results for input(s): "GLUCAP" in the last 168 hours.   No results found for this or any previous visit (from the past 240 hour(s)).       Radiology Studies: No results found.      Scheduled Meds:  atorvastatin  20 mg Oral Daily   furosemide  20 mg Intravenous Once   furosemide  80 mg Oral Daily   losartan  25 mg Oral Daily   magnesium oxide  400 mg Oral q AM   metoprolol succinate  50 mg Oral Daily   potassium chloride  10 mEq Oral q AM   spironolactone  12.5 mg Oral Daily   Continuous Infusions:  vancomycin       LOS: 1 day        Phillips Climes, MD [facility]   To contact the attending provider between 7A-7P or the covering provider during after hours 7P-7A, please log into the web site [email or web address] and access using universal [place] password for that web site. If you do not have the password, please call the hospital operator.  12/29/2021, 1:33 PM

## 2021-12-29 NOTE — Consult Note (Signed)
[facility] Consult  Reason for Consult: Concern for inflammatory breast cancer Referring Physician: Mervyn Gay, PA-C  Chief Complaint   Breast Pain     HPI: [name] [name] is a 46 y.o. female with ***.    Past Medical History:  Diagnosis Date   Abdominal cramps 08/30/2015   Anemia    Blood transfusion without reported diagnosis    CAD (coronary artery disease)    Chronic combined systolic and diastolic CHF (congestive heart failure) (HCC)    Closed left ankle fracture    August 30 2012   Clotting disorder [facility])    DVT (deep venous thrombosis) (HCC)    L leg   Fibroid tumor    Hidradenitis    High blood pressure    Hypertension    Mitral regurgitation    NICM (nonischemic cardiomyopathy) (HCC)    NSVT (nonsustained ventricular tachycardia) (HCC)    Obesity    OSA on CPAP    Oxygen deficiency    Pregnancy    09/20/14 17 weeks   Pulmonary embolism ([place]) 04/2013   Sleep apnea    Tobacco use disorder     Past Surgical History:  Procedure Laterality Date   ABSCESS DRAINAGE Bilateral 01/10/15   COLONOSCOPY WITH PROPOFOL N/A 06/08/2018   Procedure: COLONOSCOPY WITH PROPOFOL;  Surgeon: Daneil Dolin, MD;  Location: [facility];  Service: Endoscopy;  Laterality: N/A;  2:30pm   CYST REMOVAL TRUNK     IVC FILTER PLACEMENT ([place] HX)     LOWER EXTREMITY VENOGRAPHY N/A 02/07/2021   Procedure: LOWER EXTREMITY VENOGRAPHY;  Surgeon: [name], MD;  Location: [place] [facility] LAB;  Service: Cardiovascular;  Laterality: N/A;   PERIPHERAL VASCULAR THROMBECTOMY N/A 02/07/2021   Procedure: PERIPHERAL VASCULAR THROMBECTOMY;  Surgeon: [name], MD;  Location: [place] [facility] LAB;  Service: Cardiovascular;  Laterality: N/A;   RIGHT/LEFT HEART CATH AND CORONARY ANGIOGRAPHY N/A 06/19/2021   Procedure: RIGHT/LEFT HEART CATH AND CORONARY ANGIOGRAPHY;  Surgeon: Belva Crome, MD;  Location: [place] [facility];  Service: Cardiovascular;  Laterality: N/A;     Family History  Problem Relation Age of Onset   Hypertension Mother    Diabetes Paternal Aunt    Diabetes Paternal Uncle    Other Father        blood clots   Cancer Maternal Grandmother    Heart attack Paternal Grandmother    Colon cancer Neg Hx     Social History   Tobacco Use   Smoking status: Some Days    Packs/day: 0.25    Years: 18.00    Total pack years: 4.50    Types: Cigarettes   Smokeless tobacco: Never   Tobacco comments:    smokes 3 cig a day ARJ 02/15/21  Vaping Use   Vaping Use: Never used  Substance Use Topics   Alcohol use: Yes    Alcohol/week: 2.0 standard drinks of alcohol    Types: 2 Cans of beer per week    Comment: twice a month   Drug use: No    Medications: I have reviewed the patient's current medications.  Allergies  Allergen Reactions   Sulfa Antibiotics Itching, Swelling and Rash    Lip swelling     ROS:  Pertinent items are noted in HPI.  Blood pressure 108/64, pulse 76, temperature 98.4 F (36.9 C), temperature source Oral, resp. rate 18, height '5\' 11"'$  (1.803 m), weight (!) 149.2 kg, last menstrual period 11/29/2021, SpO2 100 %.  Physical Exam  Results: Results for orders placed or performed during the hospital encounter of 12/28/21 (from the past 48 hour(s))  CBC with Differential     Status: Abnormal   Collection Time: 12/28/21  1:30 PM  Result Value Ref Range   WBC 8.1 4.0 - 10.5 K/uL   RBC 4.44 3.87 - 5.11 MIL/uL   Hemoglobin 8.7 (L) 12.0 - 15.0 g/dL    Comment: Reticulocyte Hemoglobin testing may be clinically indicated, consider ordering this additional test [id number]    HCT 31.7 (L) 36.0 - 46.0 %   MCV 71.4 (L) 80.0 - 100.0 fL   MCH 19.6 (L) 26.0 - 34.0 pg   MCHC 27.4 (L) 30.0 - 36.0 g/dL   RDW 23.0 (H) 11.5 - 15.5 %   Platelets 275 150 - 400 K/uL   nRBC 0.0 0.0 - 0.2 %   Neutrophils Relative % 79 %   Neutro Abs 6.5 1.7 - 7.7 K/uL   Lymphocytes Relative 11 %   Lymphs Abs 0.9 0.7 - 4.0 K/uL   Monocytes  Relative 7 %   Monocytes Absolute 0.5 0.1 - 1.0 K/uL   Eosinophils Relative 2 %   Eosinophils Absolute 0.1 0.0 - 0.5 K/uL   Basophils Relative 1 %   Basophils Absolute 0.1 0.0 - 0.1 K/uL   WBC Morphology MORPHOLOGY UNREMARKABLE    Immature Granulocytes 0 %   Abs Immature Granulocytes 0.02 0.00 - 0.07 K/uL   Polychromasia PRESENT    Target Cells PRESENT    Giant PLTs PRESENT     Comment: Performed at [facility], [address], [place], [place] [address]  Basic metabolic panel     Status: Abnormal   Collection Time: 12/28/21  1:30 PM  Result Value Ref Range   Sodium 140 135 - 145 mmol/L   Potassium 4.5 3.5 - 5.1 mmol/L   Chloride 103 98 - 111 mmol/L   CO2 28 22 - 32 mmol/L   Glucose, Bld 89 70 - 99 mg/dL    Comment: Glucose reference range applies only to samples taken after fasting for at least 8 hours.   BUN 17 6 - 20 mg/dL   Creatinine, Ser 0.96 0.44 - 1.00 mg/dL   Calcium 8.8 (L) 8.9 - 10.3 mg/dL   GFR, Estimated >60 >60 mL/min    Comment: (NOTE) Calculated using the CKD-EPI Creatinine Equation (2021)    Anion gap 9 5 - 15    Comment: Performed at [facility], [address] [place], [place] [address]  Lactic acid, plasma     Status: Abnormal   Collection Time: 12/28/21  1:30 PM  Result Value Ref Range   Lactic Acid, Venous 2.2 (HH) 0.5 - 1.9 mmol/L    Comment: CRITICAL RESULT CALLED TO, READ BACK BY AND VERIFIED WITH: T.TALBERT BY LBASTON 1442,12/28/21. Performed at [facility], [address] [place], [place] [address]   Lactic acid, plasma     Status: None   Collection Time: 12/28/21  3:19 PM  Result Value Ref Range   Lactic Acid, Venous 1.9 0.5 - 1.9 mmol/L    Comment: Performed at [facility], [address], [place], [place] [address]  Type and screen [facility]     Status: None   Collection Time: 12/28/21  5:07 PM  Result Value Ref Range   ABO/RH(D) A POS    Antibody Screen NEG    Sample Expiration      12/31/2021,2359 Performed at [facility] [place]  [address] [place], [place] [address]   Prepare fresh frozen plasma     Status: None (Preliminary result)   Collection Time: 12/28/21  5:07 PM  Result Value Ref Range   Unit Number [id number]    Blood Component Type FP24 PHR THW    Unit division A0    Status of Unit ISSUED,FINAL    Transfusion Status      OK TO TRANSFUSE Performed at [facility], [address], [place], [place] [address]    Unit Number [id number]    Blood Component Type THAWED PLASMA    Unit division 00    Status of Unit ISSUED    Transfusion Status OK TO TRANSFUSE    Unit Number [id number]    Blood Component Type THAWED PLASMA    Unit division 00    Status of Unit ISSUED    Transfusion Status OK TO TRANSFUSE   Protime-INR     Status: Abnormal   Collection Time: 12/28/21 11:50 PM  Result Value Ref Range   Prothrombin Time 23.2 (H) 11.4 - 15.2 seconds   INR 2.1 (H) 0.8 - 1.2    Comment: (NOTE) INR goal varies based on device and disease states. Performed at [facility], [address] [place], [place] [address]   Protime-INR     Status: Abnormal   Collection Time: 12/29/21  6:02 AM  Result Value Ref Range   Prothrombin Time 20.8 (H) 11.4 - 15.2 seconds   INR 1.8 (H) 0.8 - 1.2    Comment: (NOTE) INR goal varies based on device and disease states. Performed at [facility], [address], [place], [place] [address]   Basic metabolic panel     Status: Abnormal   Collection Time: 12/29/21  6:02 AM  Result Value Ref Range   Sodium 136 135 - 145 mmol/L   Potassium 3.8 3.5 - 5.1 mmol/L   Chloride 102 98 - 111 mmol/L   CO2 29 22 - 32 mmol/L   Glucose, Bld 100 (H) 70 - 99 mg/dL    Comment: Glucose reference range applies only to samples taken after fasting for at least 8 hours.   BUN 18 6 - 20 mg/dL   Creatinine, Ser 0.96 0.44 - 1.00 mg/dL   Calcium 8.3 (L) 8.9 - 10.3 mg/dL   GFR, Estimated >60 >60 mL/min    Comment: (NOTE) Calculated using the CKD-EPI Creatinine Equation (2021)     Anion gap 5 5 - 15    Comment: Performed at [facility], [address], [place], [place] [address]  CBC     Status: Abnormal   Collection Time: 12/29/21  6:02 AM  Result Value Ref Range   WBC 7.0 4.0 - 10.5 K/uL   RBC 3.91 3.87 - 5.11 MIL/uL   Hemoglobin 7.5 (L) 12.0 - 15.0 g/dL    Comment: Reticulocyte Hemoglobin testing may be clinically indicated, consider ordering this additional test [id number]    HCT 27.9 (L) 36.0 - 46.0 %   MCV 71.4 (L) 80.0 - 100.0 fL   MCH 19.2 (L) 26.0 - 34.0 pg   MCHC 26.9 (L) 30.0 - 36.0 g/dL   RDW 22.8 (H) 11.5 - 15.5 %   Platelets 224 150 - 400 K/uL   nRBC 0.0 0.0 - 0.2 %    Comment: Performed at [facility], [address], [place], [place] [address]  Prepare fresh frozen plasma     Status: None (Preliminary result)   Collection Time: 12/29/21  8:26 AM  Result Value  Ref Range   Unit Number [id number]    Blood Component Type THW PLS APHR    Unit division A0    Status of Unit ISSUED    Transfusion Status      OK TO TRANSFUSE Performed at [facility] [address], [place], [place] [address]     No results found.   Assessment & Plan:  [name] [name] is a 46 y.o. female with ***  -*** -The risk and benefits of *** were discussed including but not limited to ***.  After careful consideration, [name] [name] has decided to ***.  -Follow up ***  All questions were answered to the satisfaction of the patient and family***.     -- Graciella Freer, DO [facility] [address] [place], [place] [address] [phone] (office)

## 2021-12-29 NOTE — Progress Notes (Signed)
Patient placed on CPAP 

## 2021-12-29 NOTE — Progress Notes (Signed)
Pharmacy Antibiotic Note  [name] [name] is a 46 y.o. female admitted on 12/28/2021 with cellulitis.  Pharmacy has been consulted for Vancomycin dosing.  Plan: Vancomycin 2000 mg IV loading dose, then '1250mg'$  IVQ 12 hrs. Goal AUC 400-550. Expected AUC: 460 SCr used: 0.96  F/U cxs and clinical progress Monitor V/S, labs and levels as indicated  Height: '5\' 11"'$  (180.3 cm) Weight: (!) 149.2 kg (328 lb 14.8 oz) IBW/kg (Calculated) : 70.8  Temp (24hrs), Avg:98.6 F (37 C), Min:97.7 F (36.5 C), Max:99.4 F (37.4 C)  Recent Labs  Lab 12/28/21 1330 12/28/21 1519 12/29/21 0602  WBC 8.1  --  7.0  CREATININE 0.96  --  0.96  LATICACIDVEN 2.2* 1.9  --     Estimated Creatinine Clearance: 119.4 mL/min (by C-G formula based on SCr of 0.96 mg/dL).    Allergies  Allergen Reactions   Sulfa Antibiotics Itching, Swelling and Rash    Lip swelling    Antimicrobials this admission: Vancomycin  12/8 >>   Microbiology results: No cxs  Thank you for allowing pharmacy to be a part of this patient's care.  Isac Sarna, BS Pharm D, BCPS Clinical Pharmacist 12/29/2021 7:48 AM

## 2021-12-29 NOTE — Procedures (Signed)
Bedside procedure note   Preoperative Diagnosis: Right breast mastitis, evaluate for inflammatory breast cancer Postoperative Diagnosis: Same   Procedure(s) Performed: Punch biopsy of right breast   Performing provider: Barnetta Chapel Pauletta Pickney, DO   Estimated Blood Loss: Minimal   Findings: Significantly thickened skin on right breast   Procedure: At bedside, an the area at the 10 o'clock position on the right breast was prepped with Betadine and draped with OR towels.  Verbal consent obtained from the patient prior to beginning of procedure.  Timeout was performed.  The area was localized with 1% lidocaine with epinephrine.  Using 4 mm punch biopsy, 2 areas of skin were excised.  Patient's epidermis and dermis were noted to be very thickened, so scalpel was used to excise slightly more tissue, attempting to get down to the subcutaneous fat.  These biopsy specimens were sent to pathology for evaluation.  The punch biopsy site was then closed with 3-0 nylon in a simple interrupted fashion.  Pressure dressing was applied to the right breast.  Patient tolerated the procedure without issue.  Graciella Freer, DO [facility] [address] [place], [place] [address] [phone] (office)

## 2021-12-30 DIAGNOSIS — I10 Essential (primary) hypertension: Secondary | ICD-10-CM | POA: Diagnosis not present

## 2021-12-30 DIAGNOSIS — N61 Mastitis without abscess: Secondary | ICD-10-CM | POA: Diagnosis not present

## 2021-12-30 DIAGNOSIS — I2699 Other pulmonary embolism without acute cor pulmonale: Secondary | ICD-10-CM | POA: Diagnosis not present

## 2021-12-30 LAB — BPAM FFP
Blood Product Expiration Date: 202312091834
Blood Product Expiration Date: 202312132359
Blood Product Expiration Date: 202312142359
Blood Product Expiration Date: 202312142359
Blood Product Expiration Date: 202312142359
ISSUE DATE / TIME: 202312082034
ISSUE DATE / TIME: 202312090106
ISSUE DATE / TIME: 202312090322
ISSUE DATE / TIME: 202312091213
ISSUE DATE / TIME: 202312091355
Unit Type and Rh: 2800
Unit Type and Rh: 6200
Unit Type and Rh: 8400
Unit Type and Rh: 6200
Unit Type and Rh: 6200

## 2021-12-30 LAB — CBC
HCT: 27.7 % — ABNORMAL LOW (ref 36.0–46.0)
HCT: 30.1 % — ABNORMAL LOW (ref 36.0–46.0)
Hemoglobin: 7.6 g/dL — ABNORMAL LOW (ref 12.0–15.0)
Hemoglobin: 8.2 g/dL — ABNORMAL LOW (ref 12.0–15.0)
MCH: 19.5 pg — ABNORMAL LOW (ref 26.0–34.0)
MCH: 19.5 pg — ABNORMAL LOW (ref 26.0–34.0)
MCHC: 27.4 g/dL — ABNORMAL LOW (ref 30.0–36.0)
MCHC: 27.2 g/dL — ABNORMAL LOW (ref 30.0–36.0)
MCV: 71.2 fL — ABNORMAL LOW (ref 80.0–100.0)
MCV: 71.5 fL — ABNORMAL LOW (ref 80.0–100.0)
Platelets: 215 10*3/uL (ref 150–400)
Platelets: 279 10*3/uL (ref 150–400)
RBC: 3.89 MIL/uL (ref 3.87–5.11)
RBC: 4.21 MIL/uL (ref 3.87–5.11)
RDW: 22.6 % — ABNORMAL HIGH (ref 11.5–15.5)
RDW: 22.8 % — ABNORMAL HIGH (ref 11.5–15.5)
WBC: 7.7 10*3/uL (ref 4.0–10.5)
WBC: 6.1 10*3/uL (ref 4.0–10.5)
nRBC: 0.3 % — ABNORMAL HIGH (ref 0.0–0.2)
nRBC: 0 % (ref 0.0–0.2)

## 2021-12-30 LAB — PREPARE FRESH FROZEN PLASMA
Unit division: 0
Unit division: 0

## 2021-12-30 LAB — HEPARIN LEVEL (UNFRACTIONATED): Heparin Unfractionated: 0.25 IU/mL — ABNORMAL LOW (ref 0.30–0.70)

## 2021-12-30 LAB — BASIC METABOLIC PANEL
Anion gap: 5 (ref 5–15)
BUN: 17 mg/dL (ref 6–20)
CO2: 29 mmol/L (ref 22–32)
Calcium: 8.4 mg/dL — ABNORMAL LOW (ref 8.9–10.3)
Chloride: 100 mmol/L (ref 98–111)
Creatinine, Ser: 1.03 mg/dL — ABNORMAL HIGH (ref 0.44–1.00)
GFR, Estimated: >60 mL/min (ref >60)
Glucose, Bld: 81 mg/dL (ref 70–99)
Potassium: 3.7 mmol/L (ref 3.5–5.1)
Sodium: 134 mmol/L — ABNORMAL LOW (ref 135–145)

## 2021-12-30 LAB — PROTIME-INR
INR: 1.9 — ABNORMAL HIGH (ref 0.8–1.2)
Prothrombin Time: 21.2 seconds — ABNORMAL HIGH (ref 11.4–15.2)

## 2021-12-30 MED ORDER — HEPARIN (PORCINE) 25000 UT/250ML-% IV SOLN
2600.0000 [IU]/h | INTRAVENOUS | Status: DC
  Administered 2021-12-30: 2250 [IU]/h via INTRAVENOUS
  Administered 2021-12-31 (×2): 2400 [IU]/h via INTRAVENOUS
  Administered 2022-01-01 – 2022-01-08 (×17): 2700 [IU]/h via INTRAVENOUS
  Administered 2022-01-08 – 2022-01-09 (×3): 2600 [IU]/h via INTRAVENOUS
  Filled 2021-12-30 (×23): qty 250

## 2021-12-30 NOTE — Progress Notes (Signed)
ANTICOAGULATION CONSULT NOTE - Initial Consult  Pharmacy Consult for heparin Indication: pulmonary embolus  Allergies  Allergen Reactions   Sulfa Antibiotics Itching, Swelling and Rash    Lip swelling    Patient Measurements: Height: '5\' 11"'$  (180.3 cm) Weight: (!) 149.2 kg (328 lb 14.8 oz) IBW/kg (Calculated) : 70.8 Heparin Dosing Weight: 107kg  Vital Signs: Temp: 98.5 F (36.9 C) (12/10 1439) Temp Source: Oral (12/10 1439) BP: 109/70 (12/10 1439) Pulse Rate: 66 (12/10 1439)  Labs: Recent Labs    12/28/21 1330 12/28/21 2350 12/29/21 0602 12/29/21 1538 12/30/21 0512  HGB 8.7*  --  7.5*  --  7.6*  HCT 31.7*  --  27.9*  --  27.7*  PLT 275  --  224  --  215  LABPROT  --    < > 20.8* 19.2* 21.2*  INR  --    < > 1.8* 1.6* 1.9*  CREATININE 0.96  --  0.96  --  1.03*   < > = values in this interval not displayed.    Estimated Creatinine Clearance: 111.3 mL/min (A) (by C-G formula based on SCr of 1.03 mg/dL (H)).   Medical History: Past Medical History:  Diagnosis Date   Abdominal cramps 08/30/2015   Anemia    Blood transfusion without reported diagnosis    CAD (coronary artery disease)    Chronic combined systolic and diastolic CHF (congestive heart failure) (HCC)    Closed left ankle fracture    August 30 2012   Clotting disorder [facility])    DVT (deep venous thrombosis) (HCC)    L leg   Fibroid tumor    Hidradenitis    High blood pressure    Hypertension    Mitral regurgitation    NICM (nonischemic cardiomyopathy) (HCC)    NSVT (nonsustained ventricular tachycardia) (HCC)    Obesity    OSA on CPAP    Oxygen deficiency    Pregnancy    09/20/14 17 weeks   Pulmonary embolism ([place]) 04/2013   Sleep apnea    Tobacco use disorder     Medications:  Medications Prior to Admission  Medication Sig Dispense Refill Last Dose   albuterol (VENTOLIN HFA) 108 (90 Base) MCG/ACT inhaler Inhale 2 puffs into the lungs every 2 (two) hours as needed for wheezing or shortness  of breath. 18 g 11 12/28/2021   atorvastatin (LIPITOR) 20 MG tablet Take 1 tablet (20 mg total) by mouth daily. (Patient taking differently: Take 20 mg by mouth in the morning.) 90 tablet 3 12/28/2021   furosemide (LASIX) 80 MG tablet Take 1 tablet (80 mg total) by mouth daily. (Patient taking differently: Take 80 mg by mouth in the morning.) 30 tablet 3 12/27/2021   losartan (COZAAR) 25 MG tablet Take 1 tablet (25 mg total) by mouth daily. (Patient taking differently: Take 25 mg by mouth in the morning.) 90 tablet 3 12/28/2021   Magnesium Oxide 400 MG CAPS Take 1 capsule (400 mg total) by mouth daily. (Patient taking differently: Take 400 mg by mouth in the morning.) 90 capsule 3 12/28/2021   metoprolol succinate (TOPROL-XL) 50 MG 24 hr tablet Take 1 tablet (50 mg total) by mouth daily. Take with or immediately following a meal. (Patient taking differently: Take 50 mg by mouth in the morning.) 90 tablet 3 12/28/2021 at 0730   potassium chloride (KLOR-CON) 10 MEQ tablet Take 1 tablet (10 mEq total) by mouth daily. (Patient taking differently: Take 10 mEq by mouth in the morning.) 90 tablet  3 12/28/2021   spironolactone (ALDACTONE) 25 MG tablet Take 0.5 tablets (12.5 mg total) by mouth daily. (Patient taking differently: Take 12.5 mg by mouth in the morning.) 30 tablet 3 12/28/2021   warfarin (COUMADIN) 10 MG tablet Take 5-10 mg by mouth See admin instructions. Take as directed in the evening : Monday take 10 mg and all other days take 5 mg.   12/28/2021   warfarin (COUMADIN) 5 MG tablet TAKE (1) OR (1) 1/2 TABLET BY MOUTH DAILY AS DIRECTED.   12/28/2021   amoxicillin-clavulanate (AUGMENTIN) 875-125 MG tablet Take 1 tablet by mouth every 12 (twelve) hours. (Patient not taking: Reported on 12/28/2021) 14 tablet 0 Completed Course   HYDROcodone-acetaminophen (NORCO/VICODIN) 5-325 MG tablet Take one tab po q 4 hrs prn pain (Patient not taking: Reported on 12/28/2021) 8 tablet 0 Completed Course   Scheduled:    atorvastatin  20 mg Oral Daily   furosemide  80 mg Oral Daily   losartan  25 mg Oral Daily   magnesium oxide  400 mg Oral q AM   metoprolol succinate  50 mg Oral Daily   potassium chloride  10 mEq Oral q AM   spironolactone  12.5 mg Oral Daily   Infusions:   heparin     vancomycin 1,250 mg (12/30/21 0852)    Assessment: Coumadin has been on hold for a hx of PE. Plan to bridge with heparin until surgical intervention is done. We will use previous therapeutic rate heparin.    INR 1.9 today  Goal of Therapy:  Heparin level 0.3-0.7 units/ml Monitor platelets by anticoagulation protocol: Yes   Plan:  Heparin 2250 units/hr Check 6 hr HL then daily Daily CBC F/u resume coumadin  Onnie Boer, PharmD, BCIDP, AAHIVP, CPP Infectious Disease Pharmacist 12/30/2021 4:36 PM

## 2021-12-30 NOTE — Progress Notes (Signed)
RT breast edematous with purulent malodorous drainage present. Pt c/o 8 /10 pain, oxycodone administered. Pt slept all night with CPAP in place. Blood pressure remains soft but MAP above 65.  No acute events overnight. Bryson Corona Edd Fabian

## 2021-12-30 NOTE — Progress Notes (Signed)
Pt appears to be resting comfortably. Symmetrical rise and fall of chest noted. CPAP in place. Family member at bedside resting. Bryson Corona Edd Fabian

## 2021-12-30 NOTE — Progress Notes (Signed)
PROGRESS NOTE    [name] [name]  XKG:[id number] DOB: August 29, 1975 DOA: 12/28/2021 PCP: Carrolyn Meiers, MD   Chief Complaint  Patient presents with   Breast Pain    Brief Narrative:    [name] [name]  is a 46 y.o. female, known history of DVT PE 2015 IVC filter prior on Xarelto --- recurrence DVT/PE January 2023 status post thrombectomy on lifelong anticoagulation with Coumadin secondary to weight >250 pounds, OSA on CPAP, Known uterine fibroids, Smoker With recent admission to [facility] in sept 2023 due to syncope, felt to be vasovagal.  CHF history. -Patient presented to the ED secondary to complaints of worsening right breast pain and swelling, simply discharged on oral Augmentin without much improvement, she was admitted for further workup.    Assessment & Plan:   Principal Problem:   Cellulitis of breast Active Problems:   Benign essential hypertension   Bilateral pulmonary embolism (HCC)   Cellulitis of left breast -With worsening left breast cellulitis despite being compliant with antibiotics, it is a concern of pulm malignancy, especially with abnormal mammogram last year despite negative biopsy. -Continue with IV vancomycin given she failed oral Augmentin as an outpatient. -Medical suspicion for malignancy, so general surgery to perform punch biopsy during hospital stay, awaiting for INR<1.5, 50 units FFP yesterday, will give another 2 FFP today given INR is 1.8. -ED discussed with Dr. Oneida Arenas, oncology, to be followed as an outpatient, as likely further imaging and liver biopsy needed in the outpatient setting.     DVT/PE Chronic anticoagulation -INR was reversed after FFP given for biopsy procedure -holding warfarin for now in case further procedures needed -surgery said that heparin bridge is ok  -will consult pharmD to start heparin bridge    Chronic systolic/diastolic CHF - Echocardiogram from 08/23/2021 showed EF dropped to 30 to 35% from 45-50 %  back on Jun 05, 2021 -This echo also showed moderate to severe mitral regurg that was directed posteriorly, -No aortic stenosis was noted -Grade 3 diastolic dysfunction was noted (restrictive pattern) -This echo also showed severe dilatation of the left ventricular internal cavity with LVIDd..  With worsening pulmonary artery pressures (Compared to prior TTE on 05/2021, the EF has dropped to  30-35% from 45-50%, the LV is now severely enlarged, there is now at least  moderate MR (previously mild) and the filling pressures are significantly  elevated. ) -Continue with Lasix, Aldactone, Toprol and losartan -Appears to be a euvolemic   Anemia of iron deficiency -  Hemoglobin at baseline, follow      obesity/OSA/pulmonary hypertension - continue CPAP nightly     DVT prophylaxis: heparin bridge starting 12/30/21 Code Status: Full Family Communication: none at bedside Disposition:   Status is: Inpatient    Consultants:  General Surgery   Subjective:  Ongoing purulent drainage from right breast.    Objective: Vitals:   12/29/21 2016 12/29/21 2251 12/30/21 0505 12/30/21 1439  BP: (!) 91/47  100/67 109/70  Pulse: 75 67 65 66  Resp: '16 17 17 20  '$ Temp: 98.6 F (37 C)  98.3 F (36.8 C) 98.5 F (36.9 C)  TempSrc: Oral   Oral  SpO2: 100% 95% 100% 100%  Weight:      Height:        Intake/Output Summary (Last 24 hours) at 12/30/2021 1605 Last data filed at 12/30/2021 0919 Gross per 24 hour  Intake 1150 ml  Output --  Net 1150 ml   Filed Weights   12/28/21 7026  Weight: (!) 149.2 kg    Examination:  Physical Exam Vitals and nursing note reviewed.  Constitutional:      Appearance: She is obese. She is not ill-appearing or toxic-appearing.  HENT:     Head: Normocephalic and atraumatic.     Nose: Nose normal.  Eyes:     Pupils: Pupils are equal, round, and reactive to light.  Cardiovascular:     Rate and Rhythm: Normal rate and regular rhythm.  Pulmonary:      Effort: Pulmonary effort is normal.  Chest:     Comments: Breast exam deferred 12/10 Musculoskeletal:     Cervical back: Normal range of motion.  Neurological:     Mental Status: She is alert.  Psychiatric:        Mood and Affect: Mood normal.   Data Reviewed: I have personally reviewed following labs and imaging studies  CBC: Recent Labs  Lab 12/28/21 1330 12/29/21 0602 12/30/21 0512  WBC 8.1 7.0 7.7  NEUTROABS 6.5  --   --   HGB 8.7* 7.5* 7.6*  HCT 31.7* 27.9* 27.7*  MCV 71.4* 71.4* 71.2*  PLT 275 224 496    Basic Metabolic Panel: Recent Labs  Lab 12/28/21 1330 12/29/21 0602 12/30/21 0512  NA 140 136 134*  K 4.5 3.8 3.7  CL 103 102 100  CO2 '28 29 29  '$ GLUCOSE 89 100* 81  BUN '17 18 17  '$ CREATININE 0.96 0.96 1.03*  CALCIUM 8.8* 8.3* 8.4*    GFR: Estimated Creatinine Clearance: 111.3 mL/min (A) (by C-G formula based on SCr of 1.03 mg/dL (H)).  Liver Function Tests: No results for input(s): "AST", "ALT", "ALKPHOS", "BILITOT", "PROT", "ALBUMIN" in the last 168 hours.  CBG: No results for input(s): "GLUCAP" in the last 168 hours.   No results found for this or any previous visit (from the past 240 hour(s)).   Radiology Studies: No results found.  Scheduled Meds:  atorvastatin  20 mg Oral Daily   furosemide  80 mg Oral Daily   losartan  25 mg Oral Daily   magnesium oxide  400 mg Oral q AM   metoprolol succinate  50 mg Oral Daily   potassium chloride  10 mEq Oral q AM   spironolactone  12.5 mg Oral Daily   Continuous Infusions:  vancomycin 1,250 mg (12/30/21 0852)     LOS: 2 days   Irwin Brakeman, MD [facility]   To contact the attending provider between 7A-7P or the covering provider during after hours 7P-7A, please log into the web site [email or web address] and access using universal [place] password for that web site. If you do not have the password, please call the hospital operator.  12/30/2021, 4:05 PM

## 2021-12-30 NOTE — Progress Notes (Signed)
ANTICOAGULATION CONSULT NOTE - Follow Up Consult  Pharmacy Consult for heparin Indication:  h/o PE  Labs: Recent Labs    12/28/21 1330 12/28/21 2350 12/29/21 0602 12/29/21 1538 12/30/21 0512 12/30/21 2249  HGB 8.7*  --  7.5*  --  7.6* 8.2*  HCT 31.7*  --  27.9*  --  27.7* 30.1*  PLT 275  --  224  --  215 279  LABPROT  --    < > 20.8* 19.2* 21.2*  --   INR  --    < > 1.8* 1.6* 1.9*  --   HEPARINUNFRC  --   --   --   --   --  0.25*  CREATININE 0.96  --  0.96  --  1.03*  --    < > = values in this interval not displayed.    Assessment: 46yo female subtherapeutic on heparin with initial dosing while Coumadin on hold; no infusion issues or signs of bleeding per RN.  Goal of Therapy:  Heparin level 0.3-0.7 units/ml   Plan:  Will increase heparin infusion by 1 units/kg/hr to 2400 units/hr and check level in 6 hours.    Wynona Neat, PharmD, BCPS  12/30/2021,11:47 PM

## 2021-12-30 NOTE — Progress Notes (Signed)
[facility] Progress Note     Subjective: Patient seen and examined.  She is resting in chair by the window.  She continues to have pain and purulent drainage from her right breast.  She denies any bloody drainage from the punch biopsy site.  She denies fevers and chills.  Objective: Vital signs in last 24 hours: Temp:  [98.3 F (36.8 C)-98.6 F (37 C)] 98.3 F (36.8 C) (12/10 0505) Pulse Rate:  [65-76] 65 (12/10 0505) Resp:  [16-18] 17 (12/10 0505) BP: (91-105)/(47-68) 100/67 (12/10 0505) SpO2:  [95 %-100 %] 100 % (12/10 0505) Last BM Date : 12/29/21  Intake/Output from previous day: 12/09 0701 - 12/10 0700 In: 1914.7 [P.O.:1200; I.V.:64.7; Blood:400; IV Piggyback:250] Out: -  Intake/Output this shift: Total I/O In: 300 [P.O.:300] Out: -   General appearance: alert, cooperative, and no distress Breasts: Right breast significantly edematous, erythematous, and tender to palpation; purulent drainage noted from the 3 o'clock position just lateral to the nipple, no significant area of fluctuance palpated at this time; punch biopsy site with sutures in place, no evidence of bleeding  Lab Results:  Recent Labs    12/29/21 0602 12/30/21 0512  WBC 7.0 7.7  HGB 7.5* 7.6*  HCT 27.9* 27.7*  PLT 224 215   BMET Recent Labs    12/29/21 0602 12/30/21 0512  NA 136 134*  K 3.8 3.7  CL 102 100  CO2 29 29  GLUCOSE 100* 81  BUN 18 17  CREATININE 0.96 1.03*  CALCIUM 8.3* 8.4*   PT/INR Recent Labs    12/29/21 1538 12/30/21 0512  LABPROT 19.2* 21.2*  INR 1.6* 1.9*    Studies/Results: No results found.  Anti-infectives: Anti-infectives (From admission, onward)    Start     Dose/Rate Route Frequency Ordered Stop   12/29/21 2045  vancomycin (VANCOREADY) IVPB 1250 mg/250 mL       See Hyperspace for full Linked Orders Report.   1,250 mg 166.7 mL/hr over 90 Minutes Intravenous Every 12 hours 12/29/21 0748     12/29/21 0845  vancomycin (VANCOREADY) IVPB  2000 mg/400 mL       See Hyperspace for full Linked Orders Report.   2,000 mg 200 mL/hr over 120 Minutes Intravenous  Once 12/29/21 0748 12/29/21 1127   12/28/21 1515  vancomycin (VANCOCIN) IVPB 1000 mg/200 mL premix        1,000 mg 200 mL/hr over 60 Minutes Intravenous  Once 12/28/21 1500 12/28/21 1655       Assessment/Plan:  Patient is a 46 year old female who was admitted with concern for mastitis versus inflammatory breast cancer.  -Punch biopsy performed yesterday, await final pathology results -Recommend right breast ultrasound to evaluate for underlying fluid collections that may require drainage -Continue with IV antibiotics -Continue to hold warfarin in the event patient needs further surgical interventions.  Okay to bridge with heparin -Appreciate hospitalist recommendations   LOS: 2 days    Adhya Cocco A Boysie Bonebrake 12/30/2021

## 2021-12-31 ENCOUNTER — Ambulatory Visit: Payer: Medicare HMO | Admitting: Cardiology

## 2021-12-31 ENCOUNTER — Inpatient Hospital Stay (HOSPITAL_COMMUNITY): Payer: Medicare HMO

## 2021-12-31 DIAGNOSIS — N61 Mastitis without abscess: Secondary | ICD-10-CM | POA: Diagnosis not present

## 2021-12-31 DIAGNOSIS — I10 Essential (primary) hypertension: Secondary | ICD-10-CM | POA: Diagnosis not present

## 2021-12-31 DIAGNOSIS — I2699 Other pulmonary embolism without acute cor pulmonale: Secondary | ICD-10-CM | POA: Diagnosis not present

## 2021-12-31 LAB — VITAMIN B12: Vitamin B-12: 436 pg/mL (ref 180–914)

## 2021-12-31 LAB — CBC
HCT: 25.7 % — ABNORMAL LOW (ref 36.0–46.0)
Hemoglobin: 7 g/dL — ABNORMAL LOW (ref 12.0–15.0)
MCH: 19.3 pg — ABNORMAL LOW (ref 26.0–34.0)
MCHC: 27.2 g/dL — ABNORMAL LOW (ref 30.0–36.0)
MCV: 70.8 fL — ABNORMAL LOW (ref 80.0–100.0)
Platelets: 236 10*3/uL (ref 150–400)
RBC: 3.63 MIL/uL — ABNORMAL LOW (ref 3.87–5.11)
RDW: 22.5 % — ABNORMAL HIGH (ref 11.5–15.5)
WBC: 5.8 10*3/uL (ref 4.0–10.5)
nRBC: 0 % (ref 0.0–0.2)

## 2021-12-31 LAB — IRON AND TIBC
Iron: 22 ug/dL — ABNORMAL LOW (ref 28–170)
Saturation Ratios: 6 % — ABNORMAL LOW (ref 10.4–31.8)
TIBC: 385 ug/dL (ref 250–450)
UIBC: 363 ug/dL

## 2021-12-31 LAB — BASIC METABOLIC PANEL
Anion gap: 8 (ref 5–15)
BUN: 20 mg/dL (ref 6–20)
CO2: 26 mmol/L (ref 22–32)
Calcium: 7.9 mg/dL — ABNORMAL LOW (ref 8.9–10.3)
Chloride: 99 mmol/L (ref 98–111)
Creatinine, Ser: 1.08 mg/dL — ABNORMAL HIGH (ref 0.44–1.00)
GFR, Estimated: >60 mL/min (ref >60)
Glucose, Bld: 118 mg/dL — ABNORMAL HIGH (ref 70–99)
Potassium: 3.1 mmol/L — ABNORMAL LOW (ref 3.5–5.1)
Sodium: 133 mmol/L — ABNORMAL LOW (ref 135–145)

## 2021-12-31 LAB — HEPARIN LEVEL (UNFRACTIONATED)
Heparin Unfractionated: 0.33 IU/mL (ref 0.30–0.70)
Heparin Unfractionated: 0.2 IU/mL — ABNORMAL LOW (ref 0.30–0.70)

## 2021-12-31 LAB — PREPARE RBC (CROSSMATCH)

## 2021-12-31 LAB — RETIC PANEL
Immature Retic Fract: 30.4 % — ABNORMAL HIGH (ref 2.3–15.9)
RBC.: 3.75 MIL/uL — ABNORMAL LOW (ref 3.87–5.11)
Retic Count, Absolute: 64.5 10*3/uL (ref 19.0–186.0)
Retic Ct Pct: 1.7 % (ref 0.4–3.1)
Reticulocyte Hemoglobin: 20 pg — ABNORMAL LOW (ref >27.9)

## 2021-12-31 LAB — FERRITIN: Ferritin: 11 ng/mL (ref 11–307)

## 2021-12-31 LAB — FOLATE: Folate: 7.1 ng/mL (ref >5.9)

## 2021-12-31 LAB — MAGNESIUM: Magnesium: 1.8 mg/dL (ref 1.7–2.4)

## 2021-12-31 LAB — VANCOMYCIN, TROUGH: Vancomycin Tr: 17 ug/mL (ref 15–20)

## 2021-12-31 MED ORDER — MAGNESIUM SULFATE 4 GM/100ML IV SOLN
4.0000 g | Freq: Once | INTRAVENOUS | Status: AC
  Administered 2021-12-31: 4 g via INTRAVENOUS
  Filled 2021-12-31: qty 100

## 2021-12-31 MED ORDER — SODIUM CHLORIDE 0.9% IV SOLUTION: Freq: Once | INTRAVENOUS | Status: AC

## 2021-12-31 MED ORDER — FUROSEMIDE 10 MG/ML IJ SOLN
20.0000 mg | Freq: Once | INTRAMUSCULAR | Status: AC
  Administered 2021-12-31: 20 mg via INTRAVENOUS
  Filled 2021-12-31: qty 2

## 2021-12-31 MED ORDER — POTASSIUM CHLORIDE CRYS ER 20 MEQ PO TBCR
40.0000 meq | EXTENDED_RELEASE_TABLET | Freq: Once | ORAL | Status: AC
  Administered 2021-12-31: 40 meq via ORAL
  Filled 2021-12-31: qty 2

## 2021-12-31 NOTE — Progress Notes (Signed)
radiology called results of right breast ultrasound showed suspected retroareolar abscess measuring 2.7cm consider ultrasound guidance aspiration of right breast reported to MD.

## 2021-12-31 NOTE — Plan of Care (Signed)

## 2021-12-31 NOTE — Progress Notes (Signed)
ANTICOAGULATION CONSULT NOTE - Follow Up Consult  Pharmacy Consult for heparin Indication:  h/o PE  Labs: Recent Labs    12/29/21 0602 12/29/21 1538 12/30/21 0512 12/30/21 2249 12/31/21 0429 12/31/21 0633 12/31/21 2000  HGB 7.5*  --  7.6* 8.2* 7.0*  --   --   HCT 27.9*  --  27.7* 30.1* 25.7*  --   --   PLT 224  --  215 279 236  --   --   LABPROT 20.8* 19.2* 21.2*  --   --   --   --   INR 1.8* 1.6* 1.9*  --   --   --   --   HEPARINUNFRC  --   --   --  0.25*  --  0.33 0.20*  CREATININE 0.96  --  1.03*  --  1.08*  --   --     Assessment: 46yo female subtherapeutic on heparin after one level at low end of goal; no infusion issues or signs of bleeding per RN though Hgb is low but somewhat stable.  Goal of Therapy:  Heparin level 0.3-0.7 units/ml   Plan:  Will increase heparin infusion by 3 units/kgABW/hr to 2700 units/hr and check level in 6 hours.    Wynona Neat, PharmD, BCPS  12/31/2021,11:28 PM

## 2021-12-31 NOTE — Progress Notes (Signed)
Patient approved for right breast aspiration 12/12 at [facility].  She will arrive via [facility] at 11 am and return to [facility] post procedure, appreciate RN arranging transport. NPO at midnight in case she requires moderate sedation  Hold heparin gtt at 0900 tomorrow  Full consult/consent to be completed upon patient arrival to [facility].  Please call with questions or concerns.  Candiss Norse, PA-C

## 2021-12-31 NOTE — Progress Notes (Addendum)
Patient hemoglobin results 7.0. BP 101/61 HR 62. AMION page sent to Dr. Sidney Ace and notified Charge RN Rush Barer.   Sent Dr. Sidney Ace secure chat with hemoglobin results, BP, and HR (as noted above).

## 2021-12-31 NOTE — Progress Notes (Signed)
Pharmacy Antibiotic Note  [name] [name] is a 46 y.o. female admitted on 12/28/2021 with cellulitis.  Pharmacy has been consulted for Vancomycin dosing. VT 10mg/ml, therapeutic > slow to improve but better. F/u cxs and results  Plan: Continue Vancomycin '1250mg'$  IV Q 12 hrs. Goal AUC 400-550. Expected AUC: 460 SCr used: 0.96  F/U cxs and clinical progress Monitor V/S, labs and levels as indicated  Height: '5\' 11"'$  (180.3 cm) Weight: (!) 149.2 kg (328 lb 14.8 oz) IBW/kg (Calculated) : 70.8  Temp (24hrs), Avg:98.1 F (36.7 C), Min:97.7 F (36.5 C), Max:98.6 F (37 C)  Recent Labs  Lab 12/28/21 1330 12/28/21 1519 12/29/21 0602 12/30/21 0512 12/30/21 2249 12/31/21 0429 12/31/21 0811  WBC 8.1  --  7.0 7.7 6.1 5.8  --   CREATININE 0.96  --  0.96 1.03*  --  1.08*  --   LATICACIDVEN 2.2* 1.9  --   --   --   --   --   VANCOTROUGH  --   --   --   --   --   --  17     Estimated Creatinine Clearance: 106.1 mL/min (A) (by C-G formula based on SCr of 1.08 mg/dL (H)).    Allergies  Allergen Reactions   Sulfa Antibiotics Itching, Swelling and Rash    Lip swelling    Antimicrobials this admission: Vancomycin  12/8 >>   Microbiology results: No cxs  Thank you for allowing pharmacy to be a part of this patient's care.  LIsac Sarna BS Pharm D, BCPS Clinical Pharmacist 12/31/2021 4:27 PM

## 2021-12-31 NOTE — Progress Notes (Signed)
PROGRESS NOTE    [name] [name]  NTZ:[id number] DOB: [birth year] DOA: 12/28/2021 PCP: Carrolyn Meiers, MD   Chief Complaint  Patient presents with   Breast Pain    Brief Narrative:    [name] [name]  is a 46 y.o. female, known history of DVT PE 2015 IVC filter prior on Xarelto --- recurrence DVT/PE January 2023 status post thrombectomy on lifelong anticoagulation with Coumadin secondary to weight >250 pounds, OSA on CPAP, Known uterine fibroids, Smoker With recent admission to [name] in sept 2023 due to syncope, felt to be vasovagal.  CHF history. -Patient presented to the ED secondary to complaints of worsening right breast pain and swelling, simply discharged on oral Augmentin without much improvement, she was admitted for further workup.    Assessment & Plan:   Principal Problem:   Cellulitis of breast Active Problems:   OSA on CPAP   Severe Iron deficiency anemia   Benign essential hypertension   Morbid obesity (HCC)   History of pulmonary embolism   HTN (hypertension)   Menorrhagia   S/P IVC filter   Bilateral pulmonary embolism (HCC)   Chronic pain syndrome   Long term current use of opiate analgesic   Chronic anticoagulation   Cellulitis of right breast -With worsening right breast cellulitis despite being compliant with antibiotics, it is a concern of pulm malignancy, especially with abnormal mammogram last year despite negative biopsy. -Continue with IV vancomycin given she failed oral Augmentin as an outpatient. -Medical suspicion for malignancy, so general surgery to perform punch biopsy during hospital stay, awaiting for INR<1.5, 50 units FFP yesterday, will give another 2 FFP today given INR is 1.8. -ED discussed with Dr. Oneida Arenas, oncology, to be followed as an outpatient, as likely further imaging and liver biopsy needed in the outpatient setting.   - Breast US 12/31/21; follow up results    DVT/PE Chronic anticoagulation -INR was reversed  after FFP given for biopsy procedure -holding warfarin for now in case further procedures needed -surgery said that heparin bridge is ok  -consulted pharmD to manage heparin bridge    Chronic systolic/diastolic CHF - Echocardiogram from 08/23/2021 showed EF dropped to 30 to 35% from 45-50 % back on Jun 05, 2021 -This echo also showed moderate to severe mitral regurg that was directed posteriorly, -No aortic stenosis was noted -Grade 3 diastolic dysfunction was noted (restrictive pattern) -This echo also showed severe dilatation of the left ventricular internal cavity with LVIDd..  With worsening pulmonary artery pressures (Compared to prior TTE on 05/2021, the EF has dropped to  30-35% from 45-50%, the LV is now severely enlarged, there is now at least  moderate MR (previously mild) and the filling pressures are significantly  elevated. ) -Continue with Lasix, Aldactone, Toprol and losartan -Appears to be a euvolemic   Anemia of iron deficiency -  Hemoglobin down to 7 -  transfuse 2 units PRBC 12/11  -  transfuse slowly and provide IV lasix between units -  Iron studies pending -  recheck CBC in AM      obesity/OSA/pulmonary hypertension - continue CPAP nightly     DVT prophylaxis: heparin bridge starting 12/30/21 Code Status: Full Family Communication: updated at bedside 12/11 Disposition:   Status is: Inpatient    Consultants:  General Surgery   Subjective:  Pt says she just completed her menses and she has menorrhagia and loses a lot of blood.    Objective: Vitals:   12/31/21 0418 12/31/21 0804 12/31/21 1147  12/31/21 1225  BP: 101/61 113/66 98/65 126/66  Pulse: 62 71 66 63  Resp: (!) '22  16 16  '$ Temp: 98.6 F (37 C)  97.9 F (36.6 C) 98.1 F (36.7 C)  TempSrc:   Oral Oral  SpO2: 100%  99% 100%  Weight:      Height:        Intake/Output Summary (Last 24 hours) at 12/31/2021 1309 Last data filed at 12/31/2021 0930 Gross per 24 hour  Intake 970.21 ml   Output --  Net 970.21 ml    Filed Weights   12/28/21 0937  Weight: (!) 149.2 kg    Examination:  Physical Exam Vitals and nursing note reviewed.  Constitutional:      Appearance: She is obese. She is not ill-appearing or toxic-appearing.  HENT:     Head: Normocephalic and atraumatic.     Nose: Nose normal.  Eyes:     Pupils: Pupils are equal, round, and reactive to light.  Cardiovascular:     Rate and Rhythm: Normal rate and regular rhythm.  Pulmonary:     Effort: Pulmonary effort is normal.     Comments: Breast exam deferred to surgeon Chest:     Comments: Breast exam deferred Musculoskeletal:     Cervical back: Normal range of motion.  Neurological:     Mental Status: She is alert.  Psychiatric:        Mood and Affect: Mood normal.   Data Reviewed: I have personally reviewed following labs and imaging studies  CBC: Recent Labs  Lab 12/28/21 1330 12/29/21 0602 12/30/21 0512 12/30/21 2249 12/31/21 0429  WBC 8.1 7.0 7.7 6.1 5.8  NEUTROABS 6.5  --   --   --   --   HGB 8.7* 7.5* 7.6* 8.2* 7.0*  HCT 31.7* 27.9* 27.7* 30.1* 25.7*  MCV 71.4* 71.4* 71.2* 71.5* 70.8*  PLT 275 224 215 279 236     Basic Metabolic Panel: Recent Labs  Lab 12/28/21 1330 12/29/21 0602 12/30/21 0512 12/31/21 0429  NA 140 136 134* 133*  K 4.5 3.8 3.7 3.1*  CL 103 102 100 99  CO2 '28 29 29 26  '$ GLUCOSE 89 100* 81 118*  BUN '17 18 17 20  '$ CREATININE 0.96 0.96 1.03* 1.08*  CALCIUM 8.8* 8.3* 8.4* 7.9*  MG  --   --   --  1.8     GFR: Estimated Creatinine Clearance: 106.1 mL/min (A) (by C-G formula based on SCr of 1.08 mg/dL (H)).  Liver Function Tests: No results for input(s): "AST", "ALT", "ALKPHOS", "BILITOT", "PROT", "ALBUMIN" in the last 168 hours.  CBG: No results for input(s): "GLUCAP" in the last 168 hours.   No results found for this or any previous visit (from the past 240 hour(s)).   Radiology Studies: No results found.  Scheduled Meds:  sodium chloride    Intravenous Once   atorvastatin  20 mg Oral Daily   furosemide  20 mg Intravenous Once   furosemide  80 mg Oral Daily   losartan  25 mg Oral Daily   metoprolol succinate  50 mg Oral Daily   potassium chloride  10 mEq Oral q AM   spironolactone  12.5 mg Oral Daily   Continuous Infusions:  heparin 2,400 Units/hr (12/31/21 0353)   vancomycin 1,250 mg (12/31/21 1018)     LOS: 3 days   Irwin Brakeman, MD [facility]   To contact the attending provider between 7A-7P or the covering provider during after hours 7P-7A, please  log into the web site [email or web address] and access using universal [place] password for that web site. If you do not have the password, please call the hospital operator.  12/31/2021, 1:09 PM

## 2021-12-31 NOTE — TOC Initial Note (Signed)
Transition of Care [facility]) - Initial/Assessment Note    Patient Details  Name: [name] [name] MRN: [id number] Date of Birth: [birth year]  Transition of Care [facility]) CM/SW Contact:    Iona Beard, [place] Phone Number: 12/31/2021, 1:56 PM  Clinical Narrative:                 Pt is high risk for readmission. CSW spoke with pt in room to complete assessment. Pt states that she lives with her mother. Pt is independent in completing her ADLs and is able to provide her own transportation. Pt has not had HH in the past. Pt does not use any DME in the home. TOC to follow.   Expected Discharge Plan: Home/Self Care Barriers to Discharge: Continued Medical Work up   Patient Goals and CMS Choice Patient states their goals for this hospitalization and ongoing recovery are:: return home CMS Medicare.gov Compare Post Acute Care list provided to:: Patient Choice offered to / list presented to : Patient  Expected Discharge Plan and Services Expected Discharge Plan: Home/Self Care In-house Referral: Clinical Social Work Discharge Planning Services: CM Consult   Living arrangements for the past 2 months: Single Family Home                                      Prior Living Arrangements/Services Living arrangements for the past 2 months: Single Family Home Lives with:: Parents Patient language and need for interpreter reviewed:: Yes Do you feel safe going back to the place where you live?: Yes      Need for Family Participation in Patient Care: Yes (Comment) Care giver support system in place?: Yes (comment)   Criminal Activity/Legal Involvement Pertinent to Current Situation/Hospitalization: No - Comment as needed  Activities of Daily Living Home Assistive Devices/Equipment: None ADL Screening (condition at time of admission) Patient's cognitive ability adequate to safely complete daily activities?: Yes Is the patient deaf or have difficulty hearing?: No Does the patient have  difficulty seeing, even when wearing glasses/contacts?: No Does the patient have difficulty concentrating, remembering, or making decisions?: No Patient able to express need for assistance with ADLs?: Yes Does the patient have difficulty dressing or bathing?: No Independently performs ADLs?: Yes (appropriate for developmental age) Does the patient have difficulty walking or climbing stairs?: No Weakness of Legs: None Weakness of Arms/Hands: None  Permission Sought/Granted                  Emotional Assessment Appearance:: Appears stated age Attitude/Demeanor/Rapport: Engaged Affect (typically observed): Accepting Orientation: : Oriented to Self, Oriented to Place, Oriented to  Time, Oriented to Situation Alcohol / Substance Use: Not Applicable Psych Involvement: No (comment)  Admission diagnosis:  Breast swelling [N63.0] Cellulitis of breast [N61.0] Patient Active Problem List   Diagnosis Date Noted   Cellulitis of breast 12/28/2021   Closed cervical spine fracture/C2 09/22/2021   Syncope and collapse 09/22/2021   Obesity (BMI 30-39.9) 09/22/2021   Pulmonary edema 06/14/2021   Dysphagia 06/14/2021   Acute on chronic systolic congestive heart failure (HCC)    CAP (community acquired pneumonia) 06/05/2021   Acute on chronic diastolic CHF (congestive heart failure) ([place]) 06/05/2021   Symptomatic anemia 06/05/2021   Essential hypertension 06/05/2021   Hypokalemia 06/05/2021   Acute anemia    Lumbar spondylosis 12/14/2018   Lumbar foraminal stenosis 12/14/2018   Lumbar degenerative disc disease 12/14/2018   Chronic anticoagulation  03/24/2018   STD exposure 03/11/2018   History of miscarriage 12/01/2017   HTN (hypertension) 12/01/2017   Left leg DVT ([place]) 12/01/2017   Menorrhagia 12/01/2017   S/P IVC filter 12/01/2017   Chronic bilateral low back pain with bilateral sciatica 12/01/2017   Chronic pain of left ankle 12/01/2017   Chronic pain syndrome 12/01/2017   Long  term current use of opiate analgesic 12/01/2017   Encounter for therapeutic drug monitoring 09/03/2017   History of pulmonary embolism 08/27/2017   Abdominal cramps 08/30/2015   Morbid obesity ([place]) 08/18/2014   Uterine leiomyoma 08/18/2014   Benign essential hypertension 07/21/2014   Severe Iron deficiency anemia 02/05/2014   Back pain with left-sided radiculopathy 10/31/2013   Secondary pulmonary hypertension 07/06/2013   OSA on CPAP 07/06/2013   Pleuritic chest pain 04/27/2013   SOB (shortness of breath) 04/27/2013   Anemia 04/27/2013   Tobacco use disorder 04/27/2013   Saddle embolus of pulmonary artery with acute cor pulmonale ([place]) 04/27/2013   Bilateral pulmonary embolism ([place]) 04/27/2013   PCP:  Carrolyn Meiers, MD Pharmacy:   [facility] [address] [place] [address] Phone: [phone] Fax: [phone]     Social Determinants of Health (SDOH) Interventions    Readmission Risk Interventions    12/31/2021    1:52 PM  Readmission Risk Prevention Plan  HRI or [place] Complete  Social Work Consult for [place] Planning/Counseling Complete  Palliative Care Screening Not Applicable  Medication Review Press photographer) Complete

## 2021-12-31 NOTE — Progress Notes (Deleted)
Clinical Summary [name] [name] is a 46 y.o.female seen today for follow up of the following medical problems.    1. History of pulmonary embolism/Pulmonary HTN - history of unprovoked saddle PE in 04/2013. Followed by hematology, plans for lifelong anticoagulation. Has been on coumadin. From [facility] hematology notes did not recommend a DOAC, stating have not been well studied in patients over 250 lbs.  - 04/2013 LVEF 29-52%, grade I diastolic dysfunction. Flattening of interventricular septum with mild RV dysfunction, PASP 53.  -repeat CT did show near complete lysis of prior extensive PE.    05/2021 RHC: mean PA 30, PCWP 24,PVR 0.7 wood units - no bleeding on couadin     2. HTN - compilant with meds   3. Anemia - followed by hematology - on iron infusions.    4.Chronc combined systolic/diastolic HF -  Echocardiogram 06/05/21 showed EF mildly down to 45-50%, mild LVH (full report above)  - Underwent R/L heart cath on 06/19/21 that showed 40-50% proximal right coronary narrowing, otherwise normal coronary arteries. Also showed significant elevation in LVEDP, consistent with acute on chronic combince systolic and diastolic heart failure. 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  - occasional LE edema that is chronic. - taking lasix '80mg'$  daily. Hospitali discharge 274 lbs.  - needs repeat labs - soft bp's limited meds during admission, was not started on ACE/ARB/ARNI or SLGT2i     09/2021 echo LVEF 84-13%, indet diastolic, mild RV dysfunction, mod to severe MR that is functional - cMRI pending -    5. Functional MR - 09/2021 echo LVEF 24-40%, indet diastolic, mild RV dysfunction, mod to severe MR that is functional   5. History of respiratory failure - admit 05/2021 requiring intubation in setting of pneumonia, HF   6. CAD - 05/2021 cath 50% prox RCA, LVEDP 30 - has not been on statin  - no chest pains  7. Syncope - monitor  short runs of SVT at times with aberrancy without symptoms reported. 8 and 4  beat run of NSVT no reported symptoms - seen by EP, thought to be vasovagal in setting of gastritis Past Medical History:  Diagnosis Date   Abdominal cramps 08/30/2015   Anemia    Blood transfusion without reported diagnosis    CAD (coronary artery disease)    Chronic combined systolic and diastolic CHF (congestive heart failure) (HCC)    Closed left ankle fracture    August 30 2012   Clotting disorder [facility])    DVT (deep venous thrombosis) (HCC)    L leg   Fibroid tumor    Hidradenitis    High blood pressure    Hypertension    Mitral regurgitation    NICM (nonischemic cardiomyopathy) (HCC)    NSVT (nonsustained ventricular tachycardia) (HCC)    Obesity    OSA on CPAP    Oxygen deficiency    Pregnancy    09/20/14 17 weeks   Pulmonary embolism ([place]) 04/2013   Sleep apnea    Tobacco use disorder      Allergies  Allergen Reactions   Sulfa Antibiotics Itching, Swelling and Rash    Lip swelling     No current facility-administered medications for this visit.   No current outpatient medications on file.   Facility-Administered Medications Ordered in Other Visits  Medication Dose Route Frequency Provider Last Rate Last Admin   0.9 %  sodium chloride infusion (Manually  program via Guardrails IV Fluids)   Intravenous Once Johnson, Clanford L, MD       0.9 %  sodium chloride infusion   Intravenous Continuous Baird Cancer, PA-C 20 mL/hr at 12/22/15 1430 New Bag at 12/22/15 1430   acetaminophen (TYLENOL) tablet 650 mg  650 mg Oral Q6H PRN Elgergawy, Silver Huguenin, MD   650 mg at 12/29/21 0304   Or   acetaminophen (TYLENOL) suppository 650 mg  650 mg Rectal Q6H PRN Elgergawy, Silver Huguenin, MD       albuterol (PROVENTIL) (2.5 MG/3ML) 0.083% nebulizer solution 2.5 mg  2.5 mg Nebulization Q2H PRN Elgergawy, Silver Huguenin, MD       atorvastatin (LIPITOR) tablet 20 mg  20 mg Oral Daily Elgergawy, Silver Huguenin, MD   20 mg  at 12/31/21 0803   furosemide (LASIX) injection 20 mg  20 mg Intravenous Once Johnson, Clanford L, MD       furosemide (LASIX) tablet 80 mg  80 mg Oral Daily Elgergawy, Silver Huguenin, MD   80 mg at 12/31/21 0803   heparin ADULT infusion 100 units/mL (25000 units/261m)  2,400 Units/hr Intravenous Continuous BLaren Everts RPH 24 mL/hr at 12/31/21 0353 2,400 Units/hr at 12/31/21 0353   losartan (COZAAR) tablet 25 mg  25 mg Oral Daily Elgergawy, DSilver Huguenin MD   25 mg at 12/31/21 01308  magnesium sulfate IVPB 4 g 100 mL  4 g Intravenous Once JWynetta Emery Clanford L, MD 50 mL/hr at 12/31/21 0810 4 g at 12/31/21 0810   metoprolol succinate (TOPROL-XL) 24 hr tablet 50 mg  50 mg Oral Daily Elgergawy, DSilver Huguenin MD   50 mg at 12/31/21 06578  oxyCODONE (Oxy IR/ROXICODONE) immediate release tablet 5 mg  5 mg Oral Q4H PRN Elgergawy, DSilver Huguenin MD   5 mg at 12/31/21 0601   potassium chloride (KLOR-CON M) CR tablet 10 mEq  10 mEq Oral q AM Elgergawy, DSilver Huguenin MD   10 mEq at 12/31/21 0557   spironolactone (ALDACTONE) tablet 12.5 mg  12.5 mg Oral Daily Elgergawy, DSilver Huguenin MD   12.5 mg at 12/31/21 0803   vancomycin (VANCOREADY) IVPB 1250 mg/250 mL  1,250 mg Intravenous Q12H Elgergawy, DSilver Huguenin MD 166.7 mL/hr at 12/30/21 2254 Restarted at 12/30/21 2254     Past Surgical History:  Procedure Laterality Date   ABSCESS DRAINAGE Bilateral 01/10/15   COLONOSCOPY WITH PROPOFOL N/A 06/08/2018   Procedure: COLONOSCOPY WITH PROPOFOL;  Surgeon: RDaneil Dolin MD;  [facility];  Service: Endoscopy;  Laterality: N/A;  2:30pm   CYST REMOVAL TRUNK     IVC FILTER PLACEMENT ([place])     LOWER EXTREMITY VENOGRAPHY N/A 02/07/2021   Procedure: LOWER EXTREMITY VENOGRAPHY;  Surgeon: RBroadus John MD;  [facility];  Service: Cardiovascular;  Laterality: N/A;   PERIPHERAL VASCULAR THROMBECTOMY N/A 02/07/2021   Procedure: PERIPHERAL VASCULAR THROMBECTOMY;  Surgeon: RBroadus John MD;  [facility];  Service: Cardiovascular;  Laterality: N/A;   RIGHT/LEFT HEART CATH AND CORONARY ANGIOGRAPHY N/A 06/19/2021   Procedure: RIGHT/LEFT HEART CATH AND CORONARY ANGIOGRAPHY;  Surgeon: SBelva Crome MD;  [facility];  Service: Cardiovascular;  Laterality: N/A;     Allergies  Allergen Reactions   Sulfa Antibiotics Itching, Swelling and Rash    Lip swelling      Family History  Problem Relation Age of Onset   Hypertension Mother    Diabetes Paternal Aunt  Diabetes Paternal Uncle    Other Father        blood clots   Cancer Maternal Grandmother    Heart attack Paternal Grandmother    Colon cancer Neg Hx      Social History Ms. Hilgert reports that she has been smoking cigarettes. She has a 4.50 pack-year smoking history. She has never used smokeless tobacco. Ms. Hawkey reports current alcohol use of about 2.0 standard drinks of alcohol per week.   Review of Systems CONSTITUTIONAL: No weight loss, fever, chills, weakness or fatigue.  HEENT: Eyes: No visual loss, blurred vision, double vision or yellow sclerae.No hearing loss, sneezing, congestion, runny nose or sore throat.  SKIN: No rash or itching.  CARDIOVASCULAR:  RESPIRATORY: No shortness of breath, cough or sputum.  GASTROINTESTINAL: No anorexia, nausea, vomiting or diarrhea. No abdominal pain or blood.  GENITOURINARY: No burning on urination, no polyuria NEUROLOGICAL: No headache, dizziness, syncope, paralysis, ataxia, numbness or tingling in the extremities. No change in bowel or bladder control.  MUSCULOSKELETAL: No muscle, back pain, joint pain or stiffness.  LYMPHATICS: No enlarged nodes. No history of splenectomy.  PSYCHIATRIC: No history of depression or anxiety.  ENDOCRINOLOGIC: No reports of sweating, cold or heat intolerance. No polyuria or polydipsia.  Marland Kitchen   Physical Examination There were no vitals filed for this visit. There were no vitals filed for this visit.  Gen: resting  comfortably, no acute distress HEENT: no scleral icterus, pupils equal round and reactive, no palptable cervical adenopathy,  CV Resp: Clear to auscultation bilaterally GI: abdomen is soft, non-tender, non-distended, normal bowel sounds, no hepatosplenomegaly MSK: extremities are warm, no edema.  Skin: warm, no rash Neuro:  no focal deficits Psych: appropriate affect   Diagnostic Studies 04/2013 echo Study Conclusions  - Study data: Technically difficult study. - Procedure narrative: Transthoracic echocardiography. Image   quality was adequate. The study was technically difficult,   as a result of body habitus. - Left ventricle: The cavity size was normal. Wall thickness   was increased in a pattern of moderate LVH. Systolic   function was vigorous. The estimated ejection fraction was   in the range of 65% to 70%. Wall motion was normal; there   were no regional wall motion abnormalities. Doppler   parameters are consistent with abnormal left ventricular   relaxation (grade 1 diastolic dysfunction). - Aortic valve: Valve area: 2.01cm^2(VTI). Valve area:   2.11cm^2 (Vmax). - Right ventricle: There is systolic flattening of the   interventricular septum consistent with RV pressure   overload. The cavity size was severely dilated. Systolic   function was mildly reduced. - Pulmonary arteries: Systolic pressure was moderately   increased. PA peak pressure: 54m Hg (S).     08/2017 echo Study Conclusions   - Left ventricle: The cavity size was normal. Wall thickness was   increased in a pattern of mild LVH. Systolic function was normal.   The estimated ejection fraction was in the range of 60% to 65%.   Wall motion was normal; there were no regional wall motion   abnormalities. Left ventricular diastolic function parameters   were normal.     Echocardiogram 06/05/21  1. Left ventricular ejection fraction, by estimation, is 45 to 50%. The  left ventricle has mildly decreased  function. The left ventricle  demonstrates global hypokinesis. The left ventricular internal cavity size  was moderately dilated. There is mild  left ventricular hypertrophy. Left ventricular diastolic parameters are  indeterminate.   2. Right ventricular  systolic function is normal. The right ventricular  size is normal. There is severely elevated pulmonary artery systolic  pressure.   3. Left atrial size was severely dilated.   4. Right atrial size was mildly dilated.   5. The mitral valve is abnormal. Mild mitral valve regurgitation. No  evidence of mitral stenosis.   6. The tricuspid valve is abnormal.   7. The aortic valve is tricuspid. Aortic valve regurgitation is not  visualized. No aortic stenosis is present.   8. The inferior vena cava is normal in size with greater than 50%  respiratory variability, suggesting right atrial pressure of 3 mmHg.      05/2021 RHC/LHC CONCLUSIONS: 40 to 50% proximal right coronary narrowing. Right dominant anatomy Coronary arteries otherwise normal Significant elevation LVEDP 30 mmHg consistent with acute on chronic combined systolic and diastolic heart failure Mild pulmonary hypertension, mean pressure 30 mmHg.  WHO group II etiology based on hemodysnamics.. Capillary wedge mean 24 mmHg.  V wave to 40 mmHg suggesting some degree of mitral regurgitation. Cardiac output 8.5 L/min with index 3.57 L/min/m. Pulmonary resistance 0.7 Wood units.   RECOMMENDATIONS: Care with IV fluid administration. Needs diuresis. Start heart failure therapy including ARNI and SGLT2 as tolerated.   09/2021 echo 1. Left ventricular ejection fraction, by estimation, is 30 to 35%. The  left ventricle has moderately decreased function. Left ventricular  endocardial border not optimally defined to evaluate regional wall motion,  global hypokinesis seen. The left  ventricular internal cavity size was moderately dilated. There is mild  left ventricular hypertrophy.  Left ventricular diastolic parameters are  indeterminate.   2. Right ventricular systolic function is mildly reduced. The right  ventricular size is mildly enlarged. There is mildly elevated pulmonary  artery systolic pressure. The estimated right ventricular systolic  pressure is 41.9 mmHg.   3. Left atrial size was moderately dilated.   4. Right atrial size was mildly dilated.   5. MR mechanism appears to be posterior leaflet restriction with relative  anterior leaflet override with posteriorly directed jet of MR. Review of  serial studies suggests regional wall motion abnormalities contribute to  posterior leaflet restriction. No   pulmonary vein reversal seen on this study, systolic PV blunting.. The  mitral valve is grossly normal. Moderate to severe mitral valve  regurgitation. No evidence of mitral stenosis.   6. Tricuspid valve regurgitation is mild to moderate.   7. The aortic valve is tricuspid. Aortic valve regurgitation is not  visualized. No aortic stenosis is present.   8. The inferior vena cava is dilated in size with <50% respiratory  variability, suggesting right atrial pressure of 15 mmHg.    09/2021 monitor 30 day monitor. Data available from 75% of planned monitored time   Min HR 61, Max HR 159, Avg HR 81   Symptoms correlated with sinus rhythm, rare PVCs.   Short runs of SVT at times with aberrancy without symptoms reported. 8 and 4  beat run of NSVT no reported symptoms  Assessment and Plan   1. Chronic combined systolic/diastolic HF - euvolemic today, needs repeat bmet/mg on diuretic - repeat echo, LVEF 45-50% could be transient in setting of severe systemic stress at the time. If improved would not need to proceed with adding HF medications   2.Anemia - repeat cbc   3. Pulmonary HTN - RHC during admission with pulm HTN secondary to left sided heart disease, volume overload - continue to manage fluid status with diuretic  Arnoldo Lenis,  M.D., F.A.C.C.

## 2021-12-31 NOTE — Progress Notes (Signed)
[facility] Progress Note     Subjective: Patient seen and examined.  She is resting comfortably in bed.  She is currently receiving a unit of blood for hemoglobin of 7 this morning.  She continues to have some purulent drainage around her right nipple, but denies any bleeding from biopsy site.  She feels her breast is slightly softer this morning, though still tender around the nipple.  Objective: Vital signs in last 24 hours: Temp:  [97.9 F (36.6 C)-98.6 F (37 C)] 98.1 F (36.7 C) (12/11 1225) Pulse Rate:  [62-71] 63 (12/11 1225) Resp:  [16-22] 16 (12/11 1225) BP: (98-126)/(61-70) 126/66 (12/11 1225) SpO2:  [96 %-100 %] 100 % (12/11 1225) Last BM Date : 12/29/21  Intake/Output from previous day: 12/10 0701 - 12/11 0700 In: 1030.2 [P.O.:300; I.V.:230.2; IV Piggyback:500] Out: -  Intake/Output this shift: Total I/O In: 240 [P.O.:240] Out: -   General appearance: alert, cooperative, and no distress Breasts: Right breast significantly edematous, erythematous, and tender to palpation- improving edema and erythema; purulent drainage noted from the 3 o'clock position just lateral to the nipple, no significant area of fluctuance palpated at this time; punch biopsy site with sutures in place, no evidence of bleeding  Lab Results:  Recent Labs    12/30/21 2249 12/31/21 0429  WBC 6.1 5.8  HGB 8.2* 7.0*  HCT 30.1* 25.7*  PLT 279 236   BMET Recent Labs    12/30/21 0512 12/31/21 0429  NA 134* 133*  K 3.7 3.1*  CL 100 99  CO2 29 26  GLUCOSE 81 118*  BUN 17 20  CREATININE 1.03* 1.08*  CALCIUM 8.4* 7.9*   PT/INR Recent Labs    12/29/21 1538 12/30/21 0512  LABPROT 19.2* 21.2*  INR 1.6* 1.9*    Studies/Results: No results found.  Anti-infectives: Anti-infectives (From admission, onward)    Start     Dose/Rate Route Frequency Ordered Stop   12/29/21 2045  vancomycin (VANCOREADY) IVPB 1250 mg/250 mL       See Hyperspace for full Linked Orders  Report.   1,250 mg 166.7 mL/hr over 90 Minutes Intravenous Every 12 hours 12/29/21 0748     12/29/21 0845  vancomycin (VANCOREADY) IVPB 2000 mg/400 mL       See Hyperspace for full Linked Orders Report.   2,000 mg 200 mL/hr over 120 Minutes Intravenous  Once 12/29/21 0748 12/29/21 1127   12/28/21 1515  vancomycin (VANCOCIN) IVPB 1000 mg/200 mL premix        1,000 mg 200 mL/hr over 60 Minutes Intravenous  Once 12/28/21 1500 12/28/21 1655       Assessment/Plan:  Patient is a 46 year old female who was admitted with concern for mastitis versus inflammatory breast cancer.   -Punch biopsy performed 12/9, await final pathology results -Await results of breast ultrasound -Continue with IV antibiotics -Continue to hold warfarin in the event patient needs further surgical interventions.  -Heparin drip running -Appreciate hospitalist recommendations   LOS: 3 days    [name] [name] A [name] [name] 12/31/2021

## 2021-12-31 NOTE — Progress Notes (Addendum)
ANTICOAGULATION CONSULT NOTE -   Pharmacy Consult for heparin Indication: pulmonary embolus  Allergies  Allergen Reactions   Sulfa Antibiotics Itching, Swelling and Rash    Lip swelling    Patient Measurements: Height: '5\' 11"'$  (180.3 cm) Weight: (!) 149.2 kg (328 lb 14.8 oz) IBW/kg (Calculated) : 70.8 Heparin Dosing Weight: 107kg  Vital Signs: Temp: 98.6 F (37 C) (12/11 0418) Temp Source: Oral (12/10 2028) BP: 101/61 (12/11 0418) Pulse Rate: 62 (12/11 0418)  Labs: Recent Labs    12/29/21 0602 12/29/21 1538 12/30/21 0512 12/30/21 2249 12/31/21 0429 12/31/21 0633  HGB 7.5*  --  7.6* 8.2* 7.0*  --   HCT 27.9*  --  27.7* 30.1* 25.7*  --   PLT 224  --  215 279 236  --   LABPROT 20.8* 19.2* 21.2*  --   --   --   INR 1.8* 1.6* 1.9*  --   --   --   HEPARINUNFRC  --   --   --  0.25*  --  0.33  CREATININE 0.96  --  1.03*  --  1.08*  --      Estimated Creatinine Clearance: 106.1 mL/min (A) (by C-G formula based on SCr of 1.08 mg/dL (H)).   Medical History: Past Medical History:  Diagnosis Date   Abdominal cramps 08/30/2015   Anemia    Blood transfusion without reported diagnosis    CAD (coronary artery disease)    Chronic combined systolic and diastolic CHF (congestive heart failure) (HCC)    Closed left ankle fracture    August 30 2012   Clotting disorder [facility])    DVT (deep venous thrombosis) (HCC)    L leg   Fibroid tumor    Hidradenitis    High blood pressure    Hypertension    Mitral regurgitation    NICM (nonischemic cardiomyopathy) (HCC)    NSVT (nonsustained ventricular tachycardia) (HCC)    Obesity    OSA on CPAP    Oxygen deficiency    Pregnancy    09/20/14 17 weeks   Pulmonary embolism ([place]) 04/2013   Sleep apnea    Tobacco use disorder     Medications:  Medications Prior to Admission  Medication Sig Dispense Refill Last Dose   albuterol (VENTOLIN HFA) 108 (90 Base) MCG/ACT inhaler Inhale 2 puffs into the lungs every 2 (two) hours as needed  for wheezing or shortness of breath. 18 g 11 12/28/2021   atorvastatin (LIPITOR) 20 MG tablet Take 1 tablet (20 mg total) by mouth daily. (Patient taking differently: Take 20 mg by mouth in the morning.) 90 tablet 3 12/28/2021   furosemide (LASIX) 80 MG tablet Take 1 tablet (80 mg total) by mouth daily. (Patient taking differently: Take 80 mg by mouth in the morning.) 30 tablet 3 12/27/2021   losartan (COZAAR) 25 MG tablet Take 1 tablet (25 mg total) by mouth daily. (Patient taking differently: Take 25 mg by mouth in the morning.) 90 tablet 3 12/28/2021   Magnesium Oxide 400 MG CAPS Take 1 capsule (400 mg total) by mouth daily. (Patient taking differently: Take 400 mg by mouth in the morning.) 90 capsule 3 12/28/2021   metoprolol succinate (TOPROL-XL) 50 MG 24 hr tablet Take 1 tablet (50 mg total) by mouth daily. Take with or immediately following a meal. (Patient taking differently: Take 50 mg by mouth in the morning.) 90 tablet 3 12/28/2021 at 0730   potassium chloride (KLOR-CON) 10 MEQ tablet Take 1 tablet (10 mEq  total) by mouth daily. (Patient taking differently: Take 10 mEq by mouth in the morning.) 90 tablet 3 12/28/2021   spironolactone (ALDACTONE) 25 MG tablet Take 0.5 tablets (12.5 mg total) by mouth daily. (Patient taking differently: Take 12.5 mg by mouth in the morning.) 30 tablet 3 12/28/2021   warfarin (COUMADIN) 10 MG tablet Take 5-10 mg by mouth See admin instructions. Take as directed in the evening : Monday take 10 mg and all other days take 5 mg.   12/28/2021   warfarin (COUMADIN) 5 MG tablet TAKE (1) OR (1) 1/2 TABLET BY MOUTH DAILY AS DIRECTED.   12/28/2021   amoxicillin-clavulanate (AUGMENTIN) 875-125 MG tablet Take 1 tablet by mouth every 12 (twelve) hours. (Patient not taking: Reported on 12/28/2021) 14 tablet 0 Completed Course   HYDROcodone-acetaminophen (NORCO/VICODIN) 5-325 MG tablet Take one tab po q 4 hrs prn pain (Patient not taking: Reported on 12/28/2021) 8 tablet 0 Completed Course    Scheduled:   atorvastatin  20 mg Oral Daily   furosemide  80 mg Oral Daily   losartan  25 mg Oral Daily   metoprolol succinate  50 mg Oral Daily   potassium chloride  10 mEq Oral q AM   potassium chloride  40 mEq Oral Once   spironolactone  12.5 mg Oral Daily   Infusions:   heparin 2,400 Units/hr (12/31/21 0353)   magnesium sulfate bolus IVPB     vancomycin 166.7 mL/hr at 12/30/21 2254    Assessment: Coumadin has been on hold for a hx of PE. Plan to bridge with heparin until surgical intervention is done. We will use previous therapeutic rate heparin.   HL 0.33, therapeutic Hgb dropped 8.2> 7, MD aware.  Just had a heavy menstrual period   Goal of Therapy:  Heparin level 0.3-0.7 units/ml Monitor platelets by anticoagulation protocol: Yes   Plan:  Continue Heparin  at 2400 units/hr Check 6 hr HL then daily Daily CBC F/u resume coumadin  Isac Sarna, BS Pharm D, BCPS Clinical Pharmacist 12/31/2021 7:36 AM

## 2022-01-01 ENCOUNTER — Ambulatory Visit (HOSPITAL_COMMUNITY)
Admit: 2022-01-01 | Discharge: 2022-01-01 | Disposition: A | Discharge status: Home / Self Care | Payer: Medicare HMO | Attending: Interventional Radiology | Admitting: Interventional Radiology

## 2022-01-01 ENCOUNTER — Encounter (HOSPITAL_COMMUNITY): Payer: Self-pay | Admitting: Hematology

## 2022-01-01 ENCOUNTER — Inpatient Hospital Stay (HOSPITAL_COMMUNITY): Payer: Medicare HMO

## 2022-01-01 DIAGNOSIS — N611 Abscess of the breast and nipple: Secondary | ICD-10-CM | POA: Insufficient documentation

## 2022-01-01 DIAGNOSIS — I10 Essential (primary) hypertension: Secondary | ICD-10-CM | POA: Diagnosis not present

## 2022-01-01 DIAGNOSIS — I2699 Other pulmonary embolism without acute cor pulmonale: Secondary | ICD-10-CM | POA: Diagnosis not present

## 2022-01-01 DIAGNOSIS — N61 Mastitis without abscess: Secondary | ICD-10-CM | POA: Diagnosis not present

## 2022-01-01 LAB — BPAM RBC
Blood Product Expiration Date: 202312232359
Blood Product Expiration Date: 202401062359
ISSUE DATE / TIME: 202312111201
ISSUE DATE / TIME: 202312111445
Unit Type and Rh: 6200
Unit Type and Rh: 6200

## 2022-01-01 LAB — BASIC METABOLIC PANEL
Anion gap: 8 (ref 5–15)
BUN: 18 mg/dL (ref 6–20)
CO2: 29 mmol/L (ref 22–32)
Calcium: 8.4 mg/dL — ABNORMAL LOW (ref 8.9–10.3)
Chloride: 99 mmol/L (ref 98–111)
Creatinine, Ser: 1.1 mg/dL — ABNORMAL HIGH (ref 0.44–1.00)
GFR, Estimated: >60 mL/min (ref >60)
Glucose, Bld: 94 mg/dL (ref 70–99)
Potassium: 4.1 mmol/L (ref 3.5–5.1)
Sodium: 136 mmol/L (ref 135–145)

## 2022-01-01 LAB — TYPE AND SCREEN
ABO/RH(D): A POS
Antibody Screen: NEGATIVE
Unit division: 0
Unit division: 0

## 2022-01-01 LAB — CBC
HCT: 30.6 % — ABNORMAL LOW (ref 36.0–46.0)
Hemoglobin: 8.6 g/dL — ABNORMAL LOW (ref 12.0–15.0)
MCH: 20.6 pg — ABNORMAL LOW (ref 26.0–34.0)
MCHC: 28.1 g/dL — ABNORMAL LOW (ref 30.0–36.0)
MCV: 73.4 fL — ABNORMAL LOW (ref 80.0–100.0)
Platelets: 249 10*3/uL (ref 150–400)
RBC: 4.17 MIL/uL (ref 3.87–5.11)
RDW: 23 % — ABNORMAL HIGH (ref 11.5–15.5)
WBC: 5.6 10*3/uL (ref 4.0–10.5)
nRBC: 0 % (ref 0.0–0.2)

## 2022-01-01 LAB — SURGICAL PATHOLOGY

## 2022-01-01 LAB — HEPARIN LEVEL (UNFRACTIONATED)
Heparin Unfractionated: 0.4 IU/mL (ref 0.30–0.70)
Heparin Unfractionated: 0.42 IU/mL (ref 0.30–0.70)

## 2022-01-01 LAB — MAGNESIUM: Magnesium: 2.1 mg/dL (ref 1.7–2.4)

## 2022-01-01 MED ORDER — FENTANYL CITRATE (PF) 100 MCG/2ML IJ SOLN
INTRAMUSCULAR | Status: AC
  Filled 2022-01-01: qty 2

## 2022-01-01 MED ORDER — MIDAZOLAM HCL 2 MG/2ML IJ SOLN
INTRAMUSCULAR | Status: AC | PRN
  Administered 2022-01-01 (×2): 1 mg via INTRAVENOUS

## 2022-01-01 MED ORDER — FENTANYL CITRATE (PF) 100 MCG/2ML IJ SOLN
INTRAMUSCULAR | Status: AC | PRN
  Administered 2022-01-01 (×2): 50 ug via INTRAVENOUS

## 2022-01-01 MED ORDER — MIDAZOLAM HCL 2 MG/2ML IJ SOLN
INTRAMUSCULAR | Status: AC
  Filled 2022-01-01: qty 2

## 2022-01-01 MED ORDER — LIDOCAINE HCL (PF) 1 % IJ SOLN
INTRAMUSCULAR | Status: AC
  Filled 2022-01-01: qty 30

## 2022-01-01 MED ORDER — LIDOCAINE HCL (PF) 1 % IJ SOLN: 7.0000 mL | Freq: Once | INTRAMUSCULAR | Status: DC

## 2022-01-01 NOTE — Progress Notes (Signed)
PROGRESS NOTE    [name] [name]  RFF:[id number] DOB: Aug 25, 1975 DOA: 12/28/2021 PCP: Carrolyn Meiers, MD   Chief Complaint  Patient presents with   Breast Pain    Brief Narrative:    [name] [name]  is a 46 y.o. female, known history of DVT PE 2015 IVC filter prior on Xarelto --- recurrence DVT/PE January 2023 status post thrombectomy on lifelong anticoagulation with Coumadin secondary to weight >250 pounds, OSA on CPAP, Known uterine fibroids, Smoker With recent admission to [facility] in sept 2023 due to syncope, felt to be vasovagal.  CHF history. -Patient presented to the ED secondary to complaints of worsening right breast pain and swelling, simply discharged on oral Augmentin without much improvement, she was admitted for further workup.    Assessment & Plan:   Principal Problem:   Cellulitis of breast Active Problems:   OSA on CPAP   Severe Iron deficiency anemia   Benign essential hypertension   Morbid obesity (HCC)   History of pulmonary embolism   HTN (hypertension)   Menorrhagia   S/P IVC filter   Bilateral pulmonary embolism (HCC)   Chronic pain syndrome   Long term current use of opiate analgesic   Chronic anticoagulation   Cellulitis of right breast with abscess  -With worsening right breast cellulitis despite being compliant with antibiotics, it is a concern for malignancy, especially with abnormal mammogram last year despite negative biopsy. -Continue with IV vancomycin given she failed oral Augmentin as an outpatient. -Pt going to [place] 12/12 for IR to have US guided aspiration of right breast abscess.  Holding IV heparin for procedure.   -Medical suspicion for malignancy, so general surgery to perform punch biopsy during hospital stay, awaiting biopsy results  -ED discussed with Dr. Oneida Arenas, oncology, to be followed as an outpatient, as likely further imaging and liver biopsy needed in the outpatient setting.   - Breast US 12/31/21; planning US  aspiration   - pt noticing orange peel appearance of skin on right breast which is worrisome finding for malignancy   DVT/PE Chronic anticoagulation -INR was reversed after FFP given for biopsy procedure -holding warfarin for now in case further procedures needed -surgery said that heparin bridge is ok (temporarily holding 12/12 for procedure) -consulted pharmD to manage heparin bridge    Chronic systolic/diastolic CHF - Echocardiogram from 08/23/2021 showed EF dropped to 30 to 35% from 45-50 % back on Jun 05, 2021 -This echo also showed moderate to severe mitral regurg that was directed posteriorly, -No aortic stenosis was noted -Grade 3 diastolic dysfunction was noted (restrictive pattern) -This echo also showed severe dilatation of the left ventricular internal cavity with LVIDd..  With worsening pulmonary artery pressures (Compared to prior TTE on 05/2021, the EF has dropped to  30-35% from 45-50%, the LV is now severely enlarged, there is now at least  moderate MR (previously mild) and the filling pressures are significantly  elevated. ) -Continue with Lasix, Aldactone, Toprol and losartan -Appears to be a euvolemic   Anemia of iron deficiency -  Hemoglobin down to 7 -  transfused 2 units PRBC 12/11  -  transfuse slowly and provide IV lasix between units -  Iron studies confirm iron deficiency (likely from menorrhagia) -  recheck CBC 12/12 and Hg up to 8.6.       obesity/OSA/pulmonary hypertension - continue CPAP nightly     DVT prophylaxis: heparin bridge starting 12/30/21 (hold for procedure 12/12 then restart afterwards) Code Status: Full Family  Communication: updated at bedside 12/11 Disposition:   Status is: Inpatient   Consultants:  General Surgery  Subjective:  Pt concerned about orange peel appearance of skin of right breast.    Objective: Vitals:   12/31/21 2108 12/31/21 2307 01/01/22 0540 01/01/22 0808  BP: 98/68  98/73 112/80  Pulse: 71 66 62 63   Resp: '19 18 19   '$ Temp: (!) 97.5 F (36.4 C)  98.2 F (36.8 C)   TempSrc:      SpO2: 100% 99% 100%   Weight:      Height:        Intake/Output Summary (Last 24 hours) at 01/01/2022 0939 Last data filed at 01/01/2022 0656 Gross per 24 hour  Intake 1882.54 ml  Output --  Net 1882.54 ml    Filed Weights   12/28/21 0937  Weight: (!) 149.2 kg    Examination: Physical Exam Vitals and nursing note reviewed.  Constitutional:      General: She is not in acute distress.    Appearance: Normal appearance. She is obese. She is not ill-appearing or toxic-appearing.  HENT:     Head: Normocephalic and atraumatic.     Nose: Nose normal.  Eyes:     General: No scleral icterus.    Extraocular Movements: Extraocular movements intact.     Pupils: Pupils are equal, round, and reactive to light.  Cardiovascular:     Rate and Rhythm: Normal rate.  Pulmonary:     Effort: Pulmonary effort is normal.  Abdominal:     General: Abdomen is flat. Bowel sounds are normal.     Palpations: Abdomen is soft.  Skin:    General: Skin is warm and dry.  Neurological:     Mental Status: She is alert.  Psychiatric:        Mood and Affect: Mood normal.   Data Reviewed: I have personally reviewed following labs and imaging studies  CBC: Recent Labs  Lab 12/28/21 1330 12/29/21 0602 12/30/21 0512 12/30/21 2249 12/31/21 0429 01/01/22 0644  WBC 8.1 7.0 7.7 6.1 5.8 5.6  NEUTROABS 6.5  --   --   --   --   --   HGB 8.7* 7.5* 7.6* 8.2* 7.0* 8.6*  HCT 31.7* 27.9* 27.7* 30.1* 25.7* 30.6*  MCV 71.4* 71.4* 71.2* 71.5* 70.8* 73.4*  PLT 275 224 215 279 236 249     Basic Metabolic Panel: Recent Labs  Lab 12/28/21 1330 12/29/21 0602 12/30/21 0512 12/31/21 0429 01/01/22 0644  NA 140 136 134* 133* 136  K 4.5 3.8 3.7 3.1* 4.1  CL 103 102 100 99 99  CO2 '28 29 29 26 29  '$ GLUCOSE 89 100* 81 118* 94  BUN '17 18 17 20 18  '$ CREATININE 0.96 0.96 1.03* 1.08* 1.10*  CALCIUM 8.8* 8.3* 8.4* 7.9* 8.4*  MG   --   --   --  1.8 2.1     GFR: Estimated Creatinine Clearance: 104.2 mL/min (A) (by C-G formula based on SCr of 1.1 mg/dL (H)).  Liver Function Tests: No results for input(s): "AST", "ALT", "ALKPHOS", "BILITOT", "PROT", "ALBUMIN" in the last 168 hours.  CBG: No results for input(s): "GLUCAP" in the last 168 hours.   No results found for this or any previous visit (from the past 240 hour(s)).   Radiology Studies: US BREAST LTD UNI RIGHT INC AXILLA  Result Date: 12/31/2021 CLINICAL DATA:  RIGHT breast mass. Patient had ultrasound-guided core biopsy of mass in the retroareolar RIGHT breast performed 02/01/2020, showing  benign concordant acute and chronic inflammation. Surgical consultation was recommended for discordant findings. More recently, patient presented to the ED secondary to complaints of worsening right breast pain and swelling. She was admitted for further workup. She underwent a punch biopsy of the right breast on 12/29/2021; results are pending. Per discussion with the technologist, patient has active drainage of purulent fluid from the MEDIAL aspect of the RIGHT breast, discrete from the biopsy site. EXAM: ULTRASOUND OF THE RIGHT BREAST COMPARISON:  02/01/2020 FINDINGS: Targeted ultrasound is performed, showing marked skin thickening in the circumareolar and retroareolar regions of the RIGHT breast. A discrete hypoechoic collection is contiguous with the thickened skin in the immediate retroareolar region. This collection is 1.8 x 2.7 x 2.0 centimeters. Considerations include inflammatory changes or abscess. IMPRESSION: Suspect retroareolar abscess measuring 2.7 centimeters. RECOMMENDATION: Consider ultrasound-guided aspiration of right breast. Recommend annual screening mammography. Last bilateral mammogram was performed 01/20/2020. BI-RADS CATEGORY  2: Benign. These results will be called to the ordering clinician or representative by the Radiologist Assistant, and communication  documented in the PACS or [employer]. Electronically Signed   By: Nolon Nations M.D.   On: 12/31/2021 12:23   Scheduled Meds:  atorvastatin  20 mg Oral Daily   furosemide  80 mg Oral Daily   losartan  25 mg Oral Daily   metoprolol succinate  50 mg Oral Daily   potassium chloride  10 mEq Oral q AM   spironolactone  12.5 mg Oral Daily   Continuous Infusions:  heparin Stopped (01/01/22 0900)   vancomycin 1,250 mg (01/01/22 0804)     LOS: 4 days   Irwin Brakeman, MD [facility]  To contact the attending provider between 7A-7P or the covering provider during after hours 7P-7A, please log into the web site [email or web address] and access using universal [place] password for that web site. If you do not have the password, please call the hospital operator.  01/01/2022, 9:39 AM

## 2022-01-01 NOTE — Consult Note (Signed)
Chief Complaint: Patient was seen in consultation today for  Chief Complaint  Patient presents with   Breast Pain    Referring Physician(s): Dr. Wynetta Emery  Supervising Physician: Daryll Brod  Patient Status: [name] [name] - in patient   History of Present Illness: [name] [name] is a 46 y.o. female with a medical history significant for DVT/PE, IVC filter (placed in IR 04/29/13), on lifelong anticoagulation with coumadin, OSA, CHF and uterine fibroids. She first presented to the [facility] ED 12/16/21 with complaints of right breast pain and swelling. She was evaluated and discharged on Augmentin with instructions to follow up with her OB/GYN. She returned to the [facility] ED 12/28/21 with worsening breast pain. Her lactic acid was 2.2 and she was started on IV antibiotics. Imaging obtained shows an abscess.  US Breast Right 12/31/21 FINDINGS: Targeted ultrasound is performed, showing marked skin thickening in the circumareolar and retroareolar regions of the RIGHT breast. A discrete hypoechoic collection is contiguous with the thickened skin in the immediate retroareolar region. This collection is 1.8 x 2.7 x 2.0 centimeters. Considerations include inflammatory changes or abscess. IMPRESSION: Suspect retroareolar abscess measuring 2.7 centimeters. RECOMMENDATION: Consider ultrasound-guided aspiration of right breast.  Interventional Radiology has been asked to evaluate this patient for an image-guided breast abscess aspiration. Imaging reviewed and procedure approved by Dr. Serafina Royals.   Past Medical History:  Diagnosis Date   Abdominal cramps 08/30/2015   Anemia    Blood transfusion without reported diagnosis    CAD (coronary artery disease)    Chronic combined systolic and diastolic CHF (congestive heart failure) (HCC)    Closed left ankle fracture    August 30 2012   Clotting disorder [facility])    DVT (deep venous thrombosis) (HCC)    L leg   Fibroid tumor    Hidradenitis     High blood pressure    Hypertension    Mitral regurgitation    NICM (nonischemic cardiomyopathy) (HCC)    NSVT (nonsustained ventricular tachycardia) (HCC)    Obesity    OSA on CPAP    Oxygen deficiency    Pregnancy    09/20/14 17 weeks   Pulmonary embolism ([place]) 04/2013   Sleep apnea    Tobacco use disorder     Past Surgical History:  Procedure Laterality Date   ABSCESS DRAINAGE Bilateral 01/10/15   COLONOSCOPY WITH PROPOFOL N/A 06/08/2018   Procedure: COLONOSCOPY WITH PROPOFOL;  Surgeon: Daneil Dolin, MD;  Location: [facility];  Service: Endoscopy;  Laterality: N/A;  2:30pm   CYST REMOVAL TRUNK     IVC FILTER PLACEMENT ([place] HX)     LOWER EXTREMITY VENOGRAPHY N/A 02/07/2021   Procedure: LOWER EXTREMITY VENOGRAPHY;  Surgeon: [name], MD;  Location: [place] CV LAB;  Service: Cardiovascular;  Laterality: N/A;   PERIPHERAL VASCULAR THROMBECTOMY N/A 02/07/2021   Procedure: PERIPHERAL VASCULAR THROMBECTOMY;  Surgeon: [name], MD;  Location: [place] CV LAB;  Service: Cardiovascular;  Laterality: N/A;   RIGHT/LEFT HEART CATH AND CORONARY ANGIOGRAPHY N/A 06/19/2021   Procedure: RIGHT/LEFT HEART CATH AND CORONARY ANGIOGRAPHY;  Surgeon: Belva Crome, MD;  Location: [place] CV LAB;  Service: Cardiovascular;  Laterality: N/A;    Allergies: Sulfa antibiotics  Medications: Prior to Admission medications   Medication Sig Start Date End Date Taking? Authorizing Provider  albuterol (VENTOLIN HFA) 108 (90 Base) MCG/ACT inhaler Inhale 2 puffs into the lungs every 2 (two) hours as needed for wheezing or shortness of breath.  09/13/21  Yes Rigoberto Noel, MD  atorvastatin (LIPITOR) 20 MG tablet Take 1 tablet (20 mg total) by mouth daily. Patient taking differently: Take 20 mg by mouth in the morning. 09/10/21  Yes Branch, Alphonse Guild, MD  furosemide (LASIX) 80 MG tablet Take 1 tablet (80 mg total) by mouth daily. Patient taking differently: Take 80 mg by mouth in  the morning. 06/23/21  Yes Regalado, Belkys A, MD  losartan (COZAAR) 25 MG tablet Take 1 tablet (25 mg total) by mouth daily. Patient taking differently: Take 25 mg by mouth in the morning. 11/14/21 11/09/22 Yes Dunn, Nedra Hai, PA-C  Magnesium Oxide 400 MG CAPS Take 1 capsule (400 mg total) by mouth daily. Patient taking differently: Take 400 mg by mouth in the morning. 11/14/21  Yes Dunn, Dayna N, PA-C  metoprolol succinate (TOPROL-XL) 50 MG 24 hr tablet Take 1 tablet (50 mg total) by mouth daily. Take with or immediately following a meal. Patient taking differently: Take 50 mg by mouth in the morning. 11/14/21 11/09/22 Yes Dunn, Dayna N, PA-C  potassium chloride (KLOR-CON) 10 MEQ tablet Take 1 tablet (10 mEq total) by mouth daily. Patient taking differently: Take 10 mEq by mouth in the morning. 11/14/21 11/09/22 Yes Dunn, Dayna N, PA-C  spironolactone (ALDACTONE) 25 MG tablet Take 0.5 tablets (12.5 mg total) by mouth daily. Patient taking differently: Take 12.5 mg by mouth in the morning. 06/22/21  Yes Regalado, Belkys A, MD  warfarin (COUMADIN) 10 MG tablet Take 5-10 mg by mouth See admin instructions. Take as directed in the evening : Monday take 10 mg and all other days take 5 mg.   Yes [provider]  warfarin (COUMADIN) 5 MG tablet TAKE (1) OR (1) 1/2 TABLET BY MOUTH DAILY AS DIRECTED. 09/25/21  Yes Nita Sells, MD  amoxicillin-clavulanate (AUGMENTIN) 875-125 MG tablet Take 1 tablet by mouth every 12 (twelve) hours. Patient not taking: Reported on 12/28/2021 12/16/21   Kem Parkinson, PA-C  HYDROcodone-acetaminophen (NORCO/VICODIN) 5-325 MG tablet Take one tab po q 4 hrs prn pain Patient not taking: Reported on 12/28/2021 12/16/21   Kem Parkinson, PA-C     Family History  Problem Relation Age of Onset   Hypertension Mother    Diabetes Paternal Aunt    Diabetes Paternal Uncle    Other Father        blood clots   Cancer Maternal Grandmother    Heart attack Paternal  Grandmother    Colon cancer Neg Hx     Social History   Socioeconomic History   Marital status: Widowed    Spouse name: Not on file   Number of children: Not on file   Years of education: Not on file   Highest education level: Not on file  Occupational History   Not on file  Tobacco Use   Smoking status: Some Days    Packs/day: 0.25    Years: 18.00    Total pack years: 4.50    Types: Cigarettes   Smokeless tobacco: Never   Tobacco comments:    smokes 3 cig a day ARJ 02/15/21  Vaping Use   Vaping Use: Never used  Substance and Sexual Activity   Alcohol use: Yes    Alcohol/week: 2.0 standard drinks of alcohol    Types: 2 Cans of beer per week    Comment: twice a month   Drug use: No   Sexual activity: Not Currently    Birth control/protection: Pill, None  Other Topics Concern  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    Has a 45 year old [place].   Live with parents.   Was working 5 days a weeks.   Not working right now.    Attends church.    Social Determinants of Health   Financial Resource Strain: Not on file  Food Insecurity: Not on file  Transportation Needs: Not on file  Physical Activity: Not on file  Stress: Not on file  Social Connections: Not on file    Review of Systems: A 12 point ROS discussed and pertinent positives are indicated in the HPI above.  All other systems are negative.  Review of Systems  Constitutional:  Positive for fatigue. Negative for appetite change.  Respiratory:  Negative for cough and shortness of breath.   Cardiovascular:  Negative for chest pain and leg swelling.  Gastrointestinal:  Negative for abdominal pain, diarrhea, nausea and vomiting.  Skin:  Positive for wound.       Right breast abscess. Right breast is enlarged, red, swollen and tender. Sutures at the lateral breast from recent biopsy.   Neurological:  Negative for dizziness and headaches.    Vital  Signs: BP 96/73   Pulse (!) 58   Temp 98.2 F (36.8 C)   Resp 19   Ht '5\' 11"'$  (1.803 m)   Wt (!) 328 lb 14.8 oz (149.2 kg)   LMP 11/29/2021   SpO2 100%   BMI 45.88 kg/m   Physical Exam Constitutional:      General: She is not in acute distress.    Appearance: She is obese.  HENT:     Mouth/Throat:     Mouth: Mucous membranes are moist.     Pharynx: Oropharynx is clear.  Cardiovascular:     Rate and Rhythm: Normal rate and regular rhythm.     Pulses: Normal pulses.     Heart sounds: Normal heart sounds.  Pulmonary:     Effort: Pulmonary effort is normal.     Breath sounds: Normal breath sounds.  Abdominal:     General: Bowel sounds are normal.     Palpations: Abdomen is soft.  Musculoskeletal:     Right lower leg: Edema present.     Left lower leg: Edema present.  Skin:    General: Skin is warm and dry.     Comments: Right breast abscess. Right breast is enlarged, red, tender. Sutures in places from recent breast biopsy.   Neurological:     Mental Status: She is alert and oriented to person, place, and time.     Imaging: US BREAST LTD UNI RIGHT INC AXILLA  Result Date: 12/31/2021 CLINICAL DATA:  RIGHT breast mass. Patient had ultrasound-guided core biopsy of mass in the retroareolar RIGHT breast performed 02/01/2020, showing benign concordant acute and chronic inflammation. Surgical consultation was recommended for discordant findings. More recently, patient presented to the ED secondary to complaints of worsening right breast pain and swelling. She was admitted for further workup. She underwent a punch biopsy of the right breast on 12/29/2021; results are pending. Per discussion with the technologist, patient has active drainage of purulent fluid from the MEDIAL aspect of the RIGHT breast, discrete from the biopsy site. EXAM: ULTRASOUND OF THE RIGHT BREAST COMPARISON:  02/01/2020 FINDINGS: Targeted ultrasound is performed, showing marked skin thickening in the  circumareolar and retroareolar regions of the RIGHT breast. A discrete hypoechoic collection is contiguous with  the thickened skin in the immediate retroareolar region. This collection is 1.8 x 2.7 x 2.0 centimeters. Considerations include inflammatory changes or abscess. IMPRESSION: Suspect retroareolar abscess measuring 2.7 centimeters. RECOMMENDATION: Consider ultrasound-guided aspiration of right breast. Recommend annual screening mammography. Last bilateral mammogram was performed 01/20/2020. BI-RADS CATEGORY  2: Benign. These results will be called to the ordering clinician or representative by the Radiologist Assistant, and communication documented in the PACS or [employer]. Electronically Signed   By: Nolon Nations M.D.   On: 12/31/2021 12:23  CT Chest W Contrast  Result Date: 12/16/2021 CLINICAL DATA:  Chest wall mass EXAM: CT CHEST WITH CONTRAST TECHNIQUE: Multidetector CT imaging of the chest was performed during intravenous contrast administration. RADIATION DOSE REDUCTION: This exam was performed according to the departmental dose-optimization program which includes automated exposure control, adjustment of the mA and/or kV according to patient size and/or use of iterative reconstruction technique. CONTRAST:  23m OMNIPAQUE IOHEXOL 300 MG/ML  SOLN COMPARISON:  Mammogram 01/20/2020, breast ultrasound 02/01/2020, 01/20/2020, chest CT 06/05/2021. Chest x-ray 12/16/2021 FINDINGS: Cardiovascular: Nonaneurysmal aorta. Borderline cardiomegaly. No pericardial effusion. Mediastinum/Nodes: Midline trachea. No thyroid mass. Esophagus within normal limits. Multiple enlarged right axillary lymph nodes measuring up to 2.3 cm. Lungs/Pleura: No acute airspace disease, pleural effusion, or pneumothorax. Mild subpleural reticulation within the right anterior upper lobe. Upper Abdomen: Small volume ascites adjacent to the liver and spleen. Generalized mesenteric stranding. Partially visualized IVC filter. 2  cm hyperenhancing area in the central liver, series 2, image 154. This appears present on 06/05/2021 chest CT with suggestion of peripheral enhancement as may be seen with hemangioma. Musculoskeletal: No acute osseous abnormality. Marked diffuse skin thickening and generalized edema and stranding throughout the entirety of the right breast. No discrete rim enhancing collection by CT. The nipple areola complex is poorly defined but suspect that the nipple is retracted. There is generalized edema throughout the subcutaneous soft tissues consistent with anasarca. IMPRESSION: 1. Marked diffuse skin thickening involving the right breast with generalized edema and soft tissue stranding. Poorly defined nipple areola complex but suspect that the nipple is retracted, correlate with direct inspection. Primary differential considerations include inflammatory breast cancer versus mastitis. Surgical consultation is recommended. There are multiple enlarged right axillary lymph nodes. 2. There is considerable generalized subcutaneous edema diffusely consistent with anasarca. There is small volume ascites within the upper abdomen. 3. Hyperenhancing lesion within the central liver, potential hemangioma. When the patient is clinically stable and able to follow directions and hold their breath (preferably as an outpatient) further evaluation with dedicated abdominal MRI should be considered. Electronically Signed   By: KDonavan FoilM.D.   On: 12/16/2021 16:13   DG Chest 2 View  Result Date: 12/16/2021 CLINICAL DATA:  cough EXAM: CHEST - 2 VIEW COMPARISON:  09/14/2018 FINDINGS: Cardiac silhouette enlarged. No evidence of pneumothorax or pleural effusion. No evidence of pulmonary edema or pneumonia. There are thoracic degenerative changes IMPRESSION: Enlarged cardiac silhouette.  No focal consolidation Electronically Signed   By: JSammie BenchM.D.   On: 12/16/2021 12:01    Labs:  CBC: Recent Labs    12/30/21 0512  12/30/21 2249 12/31/21 0429 01/01/22 0644  WBC 7.7 6.1 5.8 5.6  HGB 7.6* 8.2* 7.0* 8.6*  HCT 27.7* 30.1* 25.7* 30.6*  PLT 215 279 236 249    COAGS: Recent Labs    06/12/21 2011 06/13/21 0249 12/28/21 2350 12/29/21 0602 12/29/21 1538 12/30/21 0512  INR 3.3*   < > 2.1* 1.8* 1.6* 1.9*  APTT 37*  --   --   --   --   --    < > = values in this interval not displayed.    BMP: Recent Labs    12/29/21 0602 12/30/21 0512 12/31/21 0429 01/01/22 0644  [name] 136 134* 133* 136  K 3.8 3.7 3.1* 4.1  CL 102 100 99 99  CO2 '29 29 26 29  '$ GLUCOSE 100* 81 118* 94  BUN '18 17 20 18  '$ CALCIUM 8.3* 8.4* 7.9* 8.4*  CREATININE 0.96 1.03* 1.08* 1.10*  GFRNONAA >60 >60 >60 >60    LIVER FUNCTION TESTS: Recent Labs    09/22/21 1543 09/24/21 0116 09/25/21 0142 11/14/21 0815  BILITOT 1.1 0.8 0.7 1.0  AST 59* 55* 49* 20  ALT 62* 72* 70* 29  ALKPHOS 47 38 43 69  PROT 7.1 6.3* 6.4* 7.3  ALBUMIN 3.3* 2.9* 3.0* 3.2*    TUMOR MARKERS: No results for input(s): "AFPTM", "CEA", "CA199", "CHROMGRNA" in the last 8760 hours.  Assessment and Plan:  Right breast abscess: [name] [name], 46 year old female, presents today to the [facility] department for an image-guided right breast abscess aspiration. She was transported here from [facility] via [place] and she will return to [facility] after the procedure is completed.   Risks and benefits discussed with the patient including bleeding, infection, damage to adjacent structures and sepsis.  All of the patient's questions were answered, patient is agreeable to proceed. She has been NPO.   Consent signed and in chart.  Thank you for this interesting consult.  I greatly enjoyed meeting [name] [name] and look forward to participating in their care.  A copy of this report was sent to the requesting provider on this date.  Electronically Signed: Soyla Dryer, AGACNP-BC [phone] 01/01/2022, 11:06 AM   I spent a  total of 20 Minutes    in face to face in clinical consultation, greater than 50% of which was counseling/coordinating care for breast abscess aspiration.

## 2022-01-01 NOTE — Progress Notes (Signed)
ANTICOAGULATION CONSULT NOTE -   Pharmacy Consult for heparin Indication: pulmonary embolus  Allergies  Allergen Reactions   Sulfa Antibiotics Itching, Swelling and Rash    Lip swelling    Patient Measurements: Height: '5\' 11"'$  (180.3 cm) Weight: (!) 149.2 kg (328 lb 14.8 oz) IBW/kg (Calculated) : 70.8 Heparin Dosing Weight: 107kg  Vital Signs: Temp: 98.2 F (36.8 C) (12/12 0540) BP: 98/73 (12/12 0540) Pulse Rate: 62 (12/12 0540)  Labs: Recent Labs    12/29/21 1538 12/30/21 0512 12/30/21 0512 12/30/21 2249 12/31/21 0429 12/31/21 0633 12/31/21 2000 01/01/22 0644  HGB  --  7.6*   < > 8.2* 7.0*  --   --  8.6*  HCT  --  27.7*   < > 30.1* 25.7*  --   --  30.6*  PLT  --  215   < > 279 236  --   --  249  LABPROT 19.2* 21.2*  --   --   --   --   --   --   INR 1.6* 1.9*  --   --   --   --   --   --   HEPARINUNFRC  --   --    < > 0.25*  --  0.33 0.20* 0.40  CREATININE  --  1.03*  --   --  1.08*  --   --  1.10*   < > = values in this interval not displayed.     Estimated Creatinine Clearance: 104.2 mL/min (A) (by C-G formula based on SCr of 1.1 mg/dL (H)).   Medical History: Past Medical History:  Diagnosis Date   Abdominal cramps 08/30/2015   Anemia    Blood transfusion without reported diagnosis    CAD (coronary artery disease)    Chronic combined systolic and diastolic CHF (congestive heart failure) (HCC)    Closed left ankle fracture    August 30 2012   Clotting disorder [facility])    DVT (deep venous thrombosis) (HCC)    L leg   Fibroid tumor    Hidradenitis    High blood pressure    Hypertension    Mitral regurgitation    NICM (nonischemic cardiomyopathy) (HCC)    NSVT (nonsustained ventricular tachycardia) (HCC)    Obesity    OSA on CPAP    Oxygen deficiency    Pregnancy    09/20/14 17 weeks   Pulmonary embolism ([place]) 04/2013   Sleep apnea    Tobacco use disorder     Medications:  Medications Prior to Admission  Medication Sig Dispense Refill Last  Dose   albuterol (VENTOLIN HFA) 108 (90 Base) MCG/ACT inhaler Inhale 2 puffs into the lungs every 2 (two) hours as needed for wheezing or shortness of breath. 18 g 11 12/28/2021   atorvastatin (LIPITOR) 20 MG tablet Take 1 tablet (20 mg total) by mouth daily. (Patient taking differently: Take 20 mg by mouth in the morning.) 90 tablet 3 12/28/2021   furosemide (LASIX) 80 MG tablet Take 1 tablet (80 mg total) by mouth daily. (Patient taking differently: Take 80 mg by mouth in the morning.) 30 tablet 3 12/27/2021   losartan (COZAAR) 25 MG tablet Take 1 tablet (25 mg total) by mouth daily. (Patient taking differently: Take 25 mg by mouth in the morning.) 90 tablet 3 12/28/2021   Magnesium Oxide 400 MG CAPS Take 1 capsule (400 mg total) by mouth daily. (Patient taking differently: Take 400 mg by mouth in the morning.) 90 capsule 3 12/28/2021  metoprolol succinate (TOPROL-XL) 50 MG 24 hr tablet Take 1 tablet (50 mg total) by mouth daily. Take with or immediately following a meal. (Patient taking differently: Take 50 mg by mouth in the morning.) 90 tablet 3 12/28/2021 at 0730   potassium chloride (KLOR-CON) 10 MEQ tablet Take 1 tablet (10 mEq total) by mouth daily. (Patient taking differently: Take 10 mEq by mouth in the morning.) 90 tablet 3 12/28/2021   spironolactone (ALDACTONE) 25 MG tablet Take 0.5 tablets (12.5 mg total) by mouth daily. (Patient taking differently: Take 12.5 mg by mouth in the morning.) 30 tablet 3 12/28/2021   warfarin (COUMADIN) 10 MG tablet Take 5-10 mg by mouth See admin instructions. Take as directed in the evening : Monday take 10 mg and all other days take 5 mg.   12/28/2021   warfarin (COUMADIN) 5 MG tablet TAKE (1) OR (1) 1/2 TABLET BY MOUTH DAILY AS DIRECTED.   12/28/2021   amoxicillin-clavulanate (AUGMENTIN) 875-125 MG tablet Take 1 tablet by mouth every 12 (twelve) hours. (Patient not taking: Reported on 12/28/2021) 14 tablet 0 Completed Course   HYDROcodone-acetaminophen  (NORCO/VICODIN) 5-325 MG tablet Take one tab po q 4 hrs prn pain (Patient not taking: Reported on 12/28/2021) 8 tablet 0 Completed Course   Scheduled:   atorvastatin  20 mg Oral Daily   furosemide  80 mg Oral Daily   losartan  25 mg Oral Daily   metoprolol succinate  50 mg Oral Daily   potassium chloride  10 mEq Oral q AM   spironolactone  12.5 mg Oral Daily   Infusions:   heparin Stopped (01/01/22 0900)   vancomycin Stopped (01/01/22 0009)    Assessment: Coumadin has been on hold for a hx of PE. Plan to bridge with heparin until surgical intervention is done. right breast aspiration 12/12 at [facility] IR. Holding heparin starting at 0900. F/U restart  HL 0.40, therapeutic Hgb 8.6  Goal of Therapy:  Heparin level 0.3-0.7 units/ml Monitor platelets by anticoagulation protocol: Yes   Plan:  Continue Heparin  at 2700 units/hr until 0900 F/U restart post procedure.  F/u resume coumadin  Margot Ables, PharmD Clinical Pharmacist 01/01/2022 8:04 AM

## 2022-01-01 NOTE — Progress Notes (Signed)
01/01/2022 5:38 PM  Discussed right breast biopsy results with patient who verbalized understanding.   Murvin Natal, MD

## 2022-01-01 NOTE — Progress Notes (Signed)
Patient has returned back to [facility].

## 2022-01-01 NOTE — Progress Notes (Signed)
[facility] Progress Note     Subjective: Patient seen and examined.  She is resting comfortably in bed.  She still has a small amount of drainage coming from around her nipple, but it has decreased in amount.  Objective: Vital signs in last 24 hours: Temp:  [97.5 F (36.4 C)-98.4 F (36.9 C)] 98.2 F (36.8 C) (12/12 0540) Pulse Rate:  [62-73] 63 (12/12 0808) Resp:  [16-19] 19 (12/12 0540) BP: (98-126)/(56-86) 112/80 (12/12 0808) SpO2:  [99 %-100 %] 100 % (12/12 0540) Last BM Date : 12/31/21  Intake/Output from previous day: 12/11 0701 - 12/12 0700 In: 2122.5 [P.O.:630; I.V.:298.5; Blood:694; IV Piggyback:500] Out: -  Intake/Output this shift: No intake/output data recorded.  General appearance: alert, cooperative, and no distress Breasts: Right breast with slightly improved edema and erythema; tenderness to palpation; minimal purulent drainage at the 3 o'clock position just lateral to the nipple, no significant area of fluctuance; punch biopsy site with sutures in place  Lab Results:  Recent Labs    12/31/21 0429 01/01/22 0644  WBC 5.8 5.6  HGB 7.0* 8.6*  HCT 25.7* 30.6*  PLT 236 249   BMET Recent Labs    12/31/21 0429 01/01/22 0644  NA 133* 136  K 3.1* 4.1  CL 99 99  CO2 26 29  GLUCOSE 118* 94  BUN 20 18  CREATININE 1.08* 1.10*  CALCIUM 7.9* 8.4*   PT/INR Recent Labs    12/29/21 1538 12/30/21 0512  LABPROT 19.2* 21.2*  INR 1.6* 1.9*    Studies/Results: US BREAST LTD UNI RIGHT INC AXILLA  Result Date: 12/31/2021 CLINICAL DATA:  RIGHT breast mass. Patient had ultrasound-guided core biopsy of mass in the retroareolar RIGHT breast performed 02/01/2020, showing benign concordant acute and chronic inflammation. Surgical consultation was recommended for discordant findings. More recently, patient presented to the ED secondary to complaints of worsening right breast pain and swelling. She was admitted for further workup. She underwent a  punch biopsy of the right breast on 12/29/2021; results are pending. Per discussion with the technologist, patient has active drainage of purulent fluid from the MEDIAL aspect of the RIGHT breast, discrete from the biopsy site. EXAM: ULTRASOUND OF THE RIGHT BREAST COMPARISON:  02/01/2020 FINDINGS: Targeted ultrasound is performed, showing marked skin thickening in the circumareolar and retroareolar regions of the RIGHT breast. A discrete hypoechoic collection is contiguous with the thickened skin in the immediate retroareolar region. This collection is 1.8 x 2.7 x 2.0 centimeters. Considerations include inflammatory changes or abscess. IMPRESSION: Suspect retroareolar abscess measuring 2.7 centimeters. RECOMMENDATION: Consider ultrasound-guided aspiration of right breast. Recommend annual screening mammography. Last bilateral mammogram was performed 01/20/2020. BI-RADS CATEGORY  2: Benign. These results will be called to the ordering clinician or representative by the Radiologist Assistant, and communication documented in the PACS or [employer]. Electronically Signed   By: Nolon Nations M.D.   On: 12/31/2021 12:23   Anti-infectives: Anti-infectives (From admission, onward)    Start     Dose/Rate Route Frequency Ordered Stop   12/29/21 2045  vancomycin (VANCOREADY) IVPB 1250 mg/250 mL       See Hyperspace for full Linked Orders Report.   1,250 mg 166.7 mL/hr over 90 Minutes Intravenous Every 12 hours 12/29/21 0748     12/29/21 0845  vancomycin (VANCOREADY) IVPB 2000 mg/400 mL       See Hyperspace for full Linked Orders Report.   2,000 mg 200 mL/hr over 120 Minutes Intravenous  Once 12/29/21 0748 12/29/21 1127  12/28/21 1515  vancomycin (VANCOCIN) IVPB 1000 mg/200 mL premix        1,000 mg 200 mL/hr over 60 Minutes Intravenous  Once 12/28/21 1500 12/28/21 1655       Assessment/Plan:  Patient is a 46 year old female who was admitted with concern for mastitis versus inflammatory breast  cancer.   -Punch biopsy performed 12/9, await final pathology results -Breast ultrasound demonstrates retroareolar abscess measuring 2.7 cm -Patient scheduled for ultrasound-guided aspiration today -Continue with IV antibiotics -Heparin drip per primary team -Appreciate hospitalist recommendations   LOS: 4 days    [place] 01/01/2022

## 2022-01-01 NOTE — Progress Notes (Signed)
Patient went to [facility] IR for right breast aspiration, nurse called report and stated patient tolerated procedure well pulled 6cc of fluid from right breast and sent for culture. Patient is on the way back to [facility].

## 2022-01-01 NOTE — Procedures (Signed)
Interventional Radiology Procedure Note  Procedure: [place] asp only breast abscess    Complications: None  Estimated Blood Loss:  min  Findings: 8cc pus aspirated for Cx  No large abscess that needs drain Diffuse marked breast edema c/w mastitis    M. Daryll Brod, MD

## 2022-01-01 NOTE — Progress Notes (Signed)
[facility] here to transport patient to [name] to IR for procedure of right breast

## 2022-01-01 NOTE — Progress Notes (Signed)
ANTICOAGULATION CONSULT NOTE  Pharmacy Consult for heparin Indication: pulmonary embolus  Allergies  Allergen Reactions   Sulfa Antibiotics Itching, Swelling and Rash    Lip swelling    Patient Measurements: Height: '5\' 11"'$  (180.3 cm) Weight: (!) 149.2 kg (328 lb 14.8 oz) IBW/kg (Calculated) : 70.8 Heparin Dosing Weight: 107kg  Vital Signs: Temp: 98.5 F (36.9 C) (12/12 2122) Temp Source: Oral (12/12 2122) BP: 113/71 (12/12 2122) Pulse Rate: 64 (12/12 2122)  Labs: Recent Labs    12/30/21 0512 12/30/21 2249 12/31/21 0429 12/31/21 0349 12/31/21 2000 01/01/22 0644 01/01/22 2150  HGB 7.6* 8.2* 7.0*  --   --  8.6*  --   HCT 27.7* 30.1* 25.7*  --   --  30.6*  --   PLT 215 279 236  --   --  249  --   LABPROT 21.2*  --   --   --   --   --   --   INR 1.9*  --   --   --   --   --   --   HEPARINUNFRC  --  0.25*  --    < > 0.20* 0.40 0.42  CREATININE 1.03*  --  1.08*  --   --  1.10*  --    < > = values in this interval not displayed.     Estimated Creatinine Clearance: 104.2 mL/min (A) (by C-G formula based on SCr of 1.1 mg/dL (H)).   Assessment: Coumadin has been on hold for a hx of PE. Plan to bridge with heparin until surgical intervention is done. right breast aspiration 12/12 at [facility] IR. Heparin held from 0900 - 1330.  Heparin level remains therapeutic (0.42) on infusion at 2700 units/hr. No bleeding noted.  Goal of Therapy:  Heparin level 0.3-0.7 units/ml Monitor platelets by anticoagulation protocol: Yes   Plan:  Continue heparin at 2700 units/hr  Will f/u daily heparin level and CBC F/u resume coumadin  Sherlon Handing, PharmD, BCPS Please see amion for complete clinical pharmacist phone list 01/01/2022 10:18 PM

## 2022-01-02 DIAGNOSIS — Z7901 Long term (current) use of anticoagulants: Secondary | ICD-10-CM | POA: Diagnosis not present

## 2022-01-02 DIAGNOSIS — N61 Mastitis without abscess: Secondary | ICD-10-CM | POA: Diagnosis not present

## 2022-01-02 DIAGNOSIS — I10 Essential (primary) hypertension: Secondary | ICD-10-CM | POA: Diagnosis not present

## 2022-01-02 LAB — BASIC METABOLIC PANEL
Anion gap: 5 (ref 5–15)
BUN: 18 mg/dL (ref 6–20)
CO2: 27 mmol/L (ref 22–32)
Calcium: 8.5 mg/dL — ABNORMAL LOW (ref 8.9–10.3)
Chloride: 103 mmol/L (ref 98–111)
Creatinine, Ser: 1.12 mg/dL — ABNORMAL HIGH (ref 0.44–1.00)
GFR, Estimated: >60 mL/min (ref >60)
Glucose, Bld: 92 mg/dL (ref 70–99)
Potassium: 3.8 mmol/L (ref 3.5–5.1)
Sodium: 135 mmol/L (ref 135–145)

## 2022-01-02 LAB — CBC
HCT: 32.5 % — ABNORMAL LOW (ref 36.0–46.0)
Hemoglobin: 9 g/dL — ABNORMAL LOW (ref 12.0–15.0)
MCH: 20.6 pg — ABNORMAL LOW (ref 26.0–34.0)
MCHC: 27.7 g/dL — ABNORMAL LOW (ref 30.0–36.0)
MCV: 74.5 fL — ABNORMAL LOW (ref 80.0–100.0)
Platelets: 245 10*3/uL (ref 150–400)
RBC: 4.36 MIL/uL (ref 3.87–5.11)
RDW: 23.6 % — ABNORMAL HIGH (ref 11.5–15.5)
WBC: 4.7 10*3/uL (ref 4.0–10.5)
nRBC: 0 % (ref 0.0–0.2)

## 2022-01-02 LAB — HEPARIN LEVEL (UNFRACTIONATED): Heparin Unfractionated: 0.59 IU/mL (ref 0.30–0.70)

## 2022-01-02 MED ORDER — WARFARIN SODIUM 5 MG PO TABS
10.0000 mg | ORAL_TABLET | Freq: Once | ORAL | Status: AC
  Administered 2022-01-02: 10 mg via ORAL
  Filled 2022-01-02: qty 2

## 2022-01-02 MED ORDER — SODIUM CHLORIDE 0.9 % IV SOLN
250.0000 mg | Freq: Once | INTRAVENOUS | Status: AC
  Administered 2022-01-02: 250 mg via INTRAVENOUS
  Filled 2022-01-02: qty 20

## 2022-01-02 MED ORDER — SODIUM CHLORIDE 0.9 % IV SOLN
2.0000 g | Freq: Three times a day (TID) | INTRAVENOUS | Status: DC
  Administered 2022-01-02 – 2022-01-08 (×18): 2 g via INTRAVENOUS
  Filled 2022-01-02 (×18): qty 12.5

## 2022-01-02 MED ORDER — WARFARIN - PHARMACIST DOSING INPATIENT: Freq: Every day | Status: DC

## 2022-01-02 NOTE — Progress Notes (Signed)
PROGRESS NOTE  [name] EQA:[id number] DOB: [birth year] DOA: 12/28/2021 PCP: Carrolyn Meiers, MD  Brief History:  46 y.o. female, known history of DVT PE 2015 IVC filter prior on Xarelto --- recurrence DVT/PE January 2023 status post thrombectomy on lifelong anticoagulation with Coumadin secondary to weight >250 pounds, OSA on CPAP, Known uterine fibroids, Smoker With recent admission to [facility] in sept 2023 due to syncope, felt to be vasovagal, combined CHF history (EF 30-35%).Patient presented to the ED secondary to complaints of worsening right breast pain and swelling, simply discharged on oral Augmentin without much improvement, she was admitted for further workup.   Assessment/Plan:  Cellulitis of right breast with abscess/mastitis -With worsening right breast cellulitis despite being compliant with antibiotics, it is a concern for malignancy, especially with abnormal mammogram last year despite negative biopsy. -Continue with IV vancomycin given she failed oral Augmentin as an outpatient. -Pt going to [facility] 12/12 for IR to have US guided aspiration of right breast abscess.  Holding IV heparin for procedure.   -Medical suspicion for malignancy, so general surgery to perform punch biopsy during hospital stay, awaiting biopsy results  -ED discussed with Dr. Oneida Arenas, oncology, to be followed as an outpatient, as likely further imaging and liver biopsy needed in the outpatient setting.   - Breast US 12/31/21--US aspiration   - 12/9 biopsy>>acute on chronic inflammation -add cefepime for GNR pending final culture data -02/01/2020 right breast bx>>neg malignancy -discussed with Dr. Okey Dupre   DVT/PE (2015/2023) Chronic anticoagulation -INR was reversed after FFP given for biopsy procedure -sp thrombectomy and IVC filter -holding warfarin for now in case further procedures needed -surgery said that heparin bridge is ok (temporarily holding 12/12 for  procedure) -consulted pharmD to manage heparin bridge  -restart warfarin -cannot use DOAC due to weight   Chronic combined systolic and diastolic CHF - Echocardiogram from 08/23/2021 showed EF dropped to 30 to 35% from 45-50 % back on Jun 05, 2021 -This echo also showed moderate to severe mitral regurg that was directed posteriorly, -No aortic stenosis was noted -Grade 3 diastolic dysfunction was noted (restrictive pattern) -This echo also showed severe dilatation of the left ventricular internal cavity with LVIDd..  With worsening pulmonary artery pressures (Compared to prior TTE on 05/2021, the EF has dropped to  30-35% from 45-50%, the LV is now severely enlarged, there is now at least  moderate MR (previously mild) and the filling pressures are significantly  elevated. ) -Continue with Lasix, Aldactone, losartan -hold metoprolol due to soft BPs   Anemia of iron deficiency -  Hemoglobin down to 7 -  transfused 2 units PRBC 12/11  -  transfuse slowly and provide IV lasix between units -  Iron studies confirm iron deficiency (likely from menorrhagia) -  recheck CBC 12/12 and Hg up to 8.6.    -give nulecit x 1    obesity/OSA/pulmonary hypertension - continue CPAP nightly    Morbid Obesity -BMI 45.88 -lifestyle modification      Family Communication:   no Family at bedside  Consultants:  none  Code Status:  FULL   DVT Prophylaxis:  IV Heparin    Procedures: As Listed in Progress Note Above  Antibiotics: None      Subjective: Pt states breast redness and swelling are slowly improving.  Denies f/c, cp, n/v/d, abd pain.  States legs remain swollen.  Objective: Vitals:   01/01/22 2237 01/02/22 0549 01/02/22 0819 01/02/22  1354  BP:  110/73 99/62 112/77  Pulse: 62 66 62 67  Resp: '18 20  19  '$ Temp:  97.7 F (36.5 C)  98.4 F (36.9 C)  TempSrc:  Oral    SpO2: 98% 100%  99%  Weight:      Height:        Intake/Output Summary (Last 24 hours) at 01/02/2022  1730 Last data filed at 01/02/2022 1500 Gross per 24 hour  Intake 720 ml  Output --  Net 720 ml   Weight change:  Exam:  General:  Pt is alert, follows commands appropriately, not in acute distress HEENT: No icterus, No thrush, No neck mass, /AT Cardiovascular: RRR, S1/S2, no rubs, no gallops Respiratory: CTA bilaterally, no wheezing, no crackles, no rhonchi Abdomen: Soft/+BS, non tender, non distended, no guarding Extremities: 2 + LE edema, No lymphangitis, No petechiae, No rashes, no synovitis   Data Reviewed: I have personally reviewed following labs and imaging studies Basic Metabolic Panel: Recent Labs  Lab 12/29/21 0602 12/30/21 0512 12/31/21 0429 01/01/22 0644 01/02/22 0448  NA 136 134* 133* 136 135  K 3.8 3.7 3.1* 4.1 3.8  CL 102 100 99 99 103  CO2 '29 29 26 29 27  '$ GLUCOSE 100* 81 118* 94 92  BUN '18 17 20 18 18  '$ CREATININE 0.96 1.03* 1.08* 1.10* 1.12*  CALCIUM 8.3* 8.4* 7.9* 8.4* 8.5*  MG  --   --  1.8 2.1  --    Liver Function Tests: No results for input(s): "AST", "ALT", "ALKPHOS", "BILITOT", "PROT", "ALBUMIN" in the last 168 hours. No results for input(s): "LIPASE", "AMYLASE" in the last 168 hours. No results for input(s): "AMMONIA" in the last 168 hours. Coagulation Profile: Recent Labs  Lab 12/28/21 2350 12/29/21 0602 12/29/21 1538 12/30/21 0512  INR 2.1* 1.8* 1.6* 1.9*   CBC: Recent Labs  Lab 12/28/21 1330 12/29/21 0602 12/30/21 0512 12/30/21 2249 12/31/21 0429 01/01/22 0644 01/02/22 0448  WBC 8.1   < > 7.7 6.1 5.8 5.6 4.7  NEUTROABS 6.5  --   --   --   --   --   --   HGB 8.7*   < > 7.6* 8.2* 7.0* 8.6* 9.0*  HCT 31.7*   < > 27.7* 30.1* 25.7* 30.6* 32.5*  MCV 71.4*   < > 71.2* 71.5* 70.8* 73.4* 74.5*  PLT 275   < > 215 279 236 249 245   < > = values in this interval not displayed.   Cardiac Enzymes: No results for input(s): "CKTOTAL", "CKMB", "CKMBINDEX", "TROPONINI" in the last 168 hours. BNP: Invalid input(s): "POCBNP" CBG: No  results for input(s): "GLUCAP" in the last 168 hours. HbA1C: No results for input(s): "HGBA1C" in the last 72 hours. Urine analysis:    Component Value Date/Time   COLORURINE YELLOW 09/22/2021 1506   APPEARANCEUR HAZY (A) 09/22/2021 1506   APPEARANCEUR Cloudy (A) 07/21/2014 1326   LABSPEC 1.008 09/22/2021 1506   PHURINE 6.0 09/22/2021 1506   GLUCOSEU NEGATIVE 09/22/2021 1506   HGBUR NEGATIVE 09/22/2021 1506   BILIRUBINUR NEGATIVE 09/22/2021 1506   BILIRUBINUR Negative 07/21/2014 1326   KETONESUR NEGATIVE 09/22/2021 1506   PROTEINUR NEGATIVE 09/22/2021 1506   UROBILINOGEN 0.2 06/30/2014 0910   NITRITE NEGATIVE 09/22/2021 1506   LEUKOCYTESUR TRACE (A) 09/22/2021 1506   Sepsis Labs: '@LABRCNTIP'$ (procalcitonin:4,lacticidven:4) ) Recent Results (from the past 240 hour(s))  Aerobic/Anaerobic Culture w Gram Stain (surgical/deep wound)     Status: None (Preliminary result)   Collection Time: 01/01/22 12:22  PM   Specimen: Abscess  Result Value Ref Range Status   Specimen Description ABSCESS  Final   Special Requests  BREAST RIGHT  Final   Gram Stain   Final    ABUNDANT WBC PRESENT, PREDOMINANTLY PMN MODERATE GRAM POSITIVE COCCI FEW GRAM NEGATIVE RODS    Culture   Final    NO GROWTH < 24 HOURS Performed at [facility], [place] [address], [place], [place] [address]    Report Status PENDING  Incomplete     Scheduled Meds:  atorvastatin  20 mg Oral Daily   furosemide  80 mg Oral Daily   losartan  25 mg Oral Daily   metoprolol succinate  50 mg Oral Daily   potassium chloride  10 mEq Oral q AM   spironolactone  12.5 mg Oral Daily   Warfarin - Pharmacist Dosing Inpatient   Does not apply q1600   Continuous Infusions:  heparin 2,700 Units/hr (01/02/22 1540)   vancomycin 1,250 mg (01/02/22 0821)    Procedures/Studies: US BREAST ASPIRATION RIGHT  Result Date: 01/01/2022 INDICATION: RIGHT BREAST MASTITIS, UNDERLYING ABSCESS, RECENT BIOPSY EXAM: Ultrasound right breast  needle aspiration MEDICATIONS: 1% lidocaine local ANESTHESIA/SEDATION: Moderate (conscious) sedation was employed during this procedure. A total of Versed 2.0 mg and Fentanyl 100 mcg was administered intravenously by the radiology nurse. Total intra-service moderate Sedation Time: 17 minutes. The patient's level of consciousness and vital signs were monitored continuously by radiology nursing throughout the procedure under my direct supervision. COMPLICATIONS: None immediate. PROCEDURE: Informed written consent was obtained from the patient after a thorough discussion of the procedural risks, benefits and alternatives. All questions were addressed. Maximal Sterile Barrier Technique was utilized including caps, mask, sterile gowns, sterile gloves, sterile drape, hand hygiene and skin antiseptic. A timeout was performed prior to the initiation of the procedure. Previous imaging reviewed. Preliminary on performed. Complex retroareolar hypoechoic fluid pocket was localized at the 6 o'clock position. Overlying skin marked. Under sterile conditions and local anesthesia, the Yueh sheath needle was advanced into the hypoechoic fluid collection with direct ultrasound guidance. Needle position confirmed with ultrasound. Images obtained for documentation. 8 cc purulent fluid aspirated. Sample sent for culture. Survey of the breast demonstrates diffuse edema. No additional large fluid pocket localized by ultrasound for additional aspiration or drainage. IMPRESSION: Ultrasound-guided right breast abscess needle aspiration. Electronically Signed   By: Jerilynn Mages.  Shick M.D.   On: 01/01/2022 15:59   US BREAST LTD UNI RIGHT INC AXILLA  Result Date: 12/31/2021 CLINICAL DATA:  RIGHT breast mass. Patient had ultrasound-guided core biopsy of mass in the retroareolar RIGHT breast performed 02/01/2020, showing benign concordant acute and chronic inflammation. Surgical consultation was recommended for discordant findings. More recently,  patient presented to the ED secondary to complaints of worsening right breast pain and swelling. She was admitted for further workup. She underwent a punch biopsy of the right breast on 12/29/2021; results are pending. Per discussion with the technologist, patient has active drainage of purulent fluid from the MEDIAL aspect of the RIGHT breast, discrete from the biopsy site. EXAM: ULTRASOUND OF THE RIGHT BREAST COMPARISON:  02/01/2020 FINDINGS: Targeted ultrasound is performed, showing marked skin thickening in the circumareolar and retroareolar regions of the RIGHT breast. A discrete hypoechoic collection is contiguous with the thickened skin in the immediate retroareolar region. This collection is 1.8 x 2.7 x 2.0 centimeters. Considerations include inflammatory changes or abscess. IMPRESSION: Suspect retroareolar abscess measuring 2.7 centimeters. RECOMMENDATION: Consider ultrasound-guided aspiration of right breast. Recommend annual  screening mammography. Last bilateral mammogram was performed 01/20/2020. BI-RADS CATEGORY  2: Benign. These results will be called to the ordering clinician or representative by the Radiologist Assistant, and communication documented in the PACS or [employer]. Electronically Signed   By: Nolon Nations M.D.   On: 12/31/2021 12:23  CT Chest W Contrast  Result Date: 12/16/2021 CLINICAL DATA:  Chest wall mass EXAM: CT CHEST WITH CONTRAST TECHNIQUE: Multidetector CT imaging of the chest was performed during intravenous contrast administration. RADIATION DOSE REDUCTION: This exam was performed according to the departmental dose-optimization program which includes automated exposure control, adjustment of the mA and/or kV according to patient size and/or use of iterative reconstruction technique. CONTRAST:  59m OMNIPAQUE IOHEXOL 300 MG/ML  SOLN COMPARISON:  Mammogram 01/20/2020, breast ultrasound 02/01/2020, 01/20/2020, chest CT 06/05/2021. Chest x-ray 12/16/2021 FINDINGS:  Cardiovascular: Nonaneurysmal aorta. Borderline cardiomegaly. No pericardial effusion. Mediastinum/Nodes: Midline trachea. No thyroid mass. Esophagus within normal limits. Multiple enlarged right axillary lymph nodes measuring up to 2.3 cm. Lungs/Pleura: No acute airspace disease, pleural effusion, or pneumothorax. Mild subpleural reticulation within the right anterior upper lobe. Upper Abdomen: Small volume ascites adjacent to the liver and spleen. Generalized mesenteric stranding. Partially visualized IVC filter. 2 cm hyperenhancing area in the central liver, series 2, image 154. This appears present on 06/05/2021 chest CT with suggestion of peripheral enhancement as may be seen with hemangioma. Musculoskeletal: No acute osseous abnormality. Marked diffuse skin thickening and generalized edema and stranding throughout the entirety of the right breast. No discrete rim enhancing collection by CT. The nipple areola complex is poorly defined but suspect that the nipple is retracted. There is generalized edema throughout the subcutaneous soft tissues consistent with anasarca. IMPRESSION: 1. Marked diffuse skin thickening involving the right breast with generalized edema and soft tissue stranding. Poorly defined nipple areola complex but suspect that the nipple is retracted, correlate with direct inspection. Primary differential considerations include inflammatory breast cancer versus mastitis. Surgical consultation is recommended. There are multiple enlarged right axillary lymph nodes. 2. There is considerable generalized subcutaneous edema diffusely consistent with anasarca. There is small volume ascites within the upper abdomen. 3. Hyperenhancing lesion within the central liver, potential hemangioma. When the patient is clinically stable and able to follow directions and hold their breath (preferably as an outpatient) further evaluation with dedicated abdominal MRI should be considered. Electronically Signed   By:  KDonavan FoilM.D.   On: 12/16/2021 16:13   DG Chest 2 View  Result Date: 12/16/2021 CLINICAL DATA:  cough EXAM: CHEST - 2 VIEW COMPARISON:  09/14/2018 FINDINGS: Cardiac silhouette enlarged. No evidence of pneumothorax or pleural effusion. No evidence of pulmonary edema or pneumonia. There are thoracic degenerative changes IMPRESSION: Enlarged cardiac silhouette.  No focal consolidation Electronically Signed   By: JSammie BenchM.D.   On: 12/16/2021 12:01    DOrson Eva DO  [facility]  If 7PM-7AM, please contact night-coverage [email or web address] Password [email or web address] 01/02/2022, 5:30 PM   LOS: 5 days

## 2022-01-02 NOTE — Progress Notes (Addendum)
ANTICOAGULATION CONSULT NOTE -   Pharmacy Consult for heparin Indication: pulmonary embolus  Allergies  Allergen Reactions   Sulfa Antibiotics Itching, Swelling and Rash    Lip swelling    Patient Measurements: Height: '5\' 11"'$  (180.3 cm) Weight: (!) 149.2 kg (328 lb 14.8 oz) IBW/kg (Calculated) : 70.8 Heparin Dosing Weight: 107kg  Vital Signs: Temp: 97.7 F (36.5 C) (12/13 0549) Temp Source: Oral (12/13 0549) BP: 99/62 (12/13 0819) Pulse Rate: 62 (12/13 0819)  Labs: Recent Labs    12/31/21 0429 12/31/21 7591 01/01/22 0644 01/01/22 2150 01/02/22 0448  HGB 7.0*  --  8.6*  --  9.0*  HCT 25.7*  --  30.6*  --  32.5*  PLT 236  --  249  --  245  HEPARINUNFRC  --    < > 0.40 0.42 0.59  CREATININE 1.08*  --  1.10*  --  1.12*   < > = values in this interval not displayed.     Estimated Creatinine Clearance: 102.3 mL/min (A) (by C-G formula based on SCr of 1.12 mg/dL (H)).   Medical History: Past Medical History:  Diagnosis Date   Abdominal cramps 08/30/2015   Anemia    Blood transfusion without reported diagnosis    CAD (coronary artery disease)    Chronic combined systolic and diastolic CHF (congestive heart failure) (HCC)    Closed left ankle fracture    August 30 2012   Clotting disorder [facility])    DVT (deep venous thrombosis) (HCC)    L leg   Fibroid tumor    Hidradenitis    High blood pressure    Hypertension    Mitral regurgitation    NICM (nonischemic cardiomyopathy) (HCC)    NSVT (nonsustained ventricular tachycardia) (HCC)    Obesity    OSA on CPAP    Oxygen deficiency    Pregnancy    09/20/14 17 weeks   Pulmonary embolism ([place]) 04/2013   Sleep apnea    Tobacco use disorder     Medications:  Medications Prior to Admission  Medication Sig Dispense Refill Last Dose   albuterol (VENTOLIN HFA) 108 (90 Base) MCG/ACT inhaler Inhale 2 puffs into the lungs every 2 (two) hours as needed for wheezing or shortness of breath. 18 g 11 12/28/2021    atorvastatin (LIPITOR) 20 MG tablet Take 1 tablet (20 mg total) by mouth daily. (Patient taking differently: Take 20 mg by mouth in the morning.) 90 tablet 3 12/28/2021   furosemide (LASIX) 80 MG tablet Take 1 tablet (80 mg total) by mouth daily. (Patient taking differently: Take 80 mg by mouth in the morning.) 30 tablet 3 12/27/2021   losartan (COZAAR) 25 MG tablet Take 1 tablet (25 mg total) by mouth daily. (Patient taking differently: Take 25 mg by mouth in the morning.) 90 tablet 3 12/28/2021   Magnesium Oxide 400 MG CAPS Take 1 capsule (400 mg total) by mouth daily. (Patient taking differently: Take 400 mg by mouth in the morning.) 90 capsule 3 12/28/2021   metoprolol succinate (TOPROL-XL) 50 MG 24 hr tablet Take 1 tablet (50 mg total) by mouth daily. Take with or immediately following a meal. (Patient taking differently: Take 50 mg by mouth in the morning.) 90 tablet 3 12/28/2021 at 0730   potassium chloride (KLOR-CON) 10 MEQ tablet Take 1 tablet (10 mEq total) by mouth daily. (Patient taking differently: Take 10 mEq by mouth in the morning.) 90 tablet 3 12/28/2021   spironolactone (ALDACTONE) 25 MG tablet Take 0.5 tablets (  12.5 mg total) by mouth daily. (Patient taking differently: Take 12.5 mg by mouth in the morning.) 30 tablet 3 12/28/2021   warfarin (COUMADIN) 10 MG tablet Take 5-10 mg by mouth See admin instructions. Take as directed in the evening : Monday take 10 mg and all other days take 5 mg.   12/28/2021   warfarin (COUMADIN) 5 MG tablet TAKE (1) OR (1) 1/2 TABLET BY MOUTH DAILY AS DIRECTED.   12/28/2021   amoxicillin-clavulanate (AUGMENTIN) 875-125 MG tablet Take 1 tablet by mouth every 12 (twelve) hours. (Patient not taking: Reported on 12/28/2021) 14 tablet 0 Completed Course   HYDROcodone-acetaminophen (NORCO/VICODIN) 5-325 MG tablet Take one tab po q 4 hrs prn pain (Patient not taking: Reported on 12/28/2021) 8 tablet 0 Completed Course   Scheduled:   atorvastatin  20 mg Oral Daily    furosemide  80 mg Oral Daily   losartan  25 mg Oral Daily   metoprolol succinate  50 mg Oral Daily   potassium chloride  10 mEq Oral q AM   spironolactone  12.5 mg Oral Daily   Infusions:   heparin 2,700 Units/hr (01/02/22 1638)   vancomycin 1,250 mg (01/02/22 4536)    Assessment: Coumadin has been on hold for a hx of PE. Plan to bridge with heparin until surgical intervention is done. right breast aspiration 12/12 at [facility] IR. Restarting warfarin 12/13.  Home dose listed as 10 mg every Monday and 5 mg ROW.  HL 0.59, therapeutic Hgb 9.0  Goal of Therapy:  Heparin level 0.3-0.7 units/ml Monitor platelets by anticoagulation protocol: Yes   Plan:  Continue Heparin  at 2700 units/hr  Daily heparin level Warfarin 10 mg x 1 dose then continue home dosing.   Margot Ables, PharmD Clinical Pharmacist 01/02/2022 8:39 AM

## 2022-01-02 NOTE — Progress Notes (Addendum)
[facility] Progress Note     Subjective: Patient seen and examined.  She is resting comfortably in bed.  She still has some purulent drainage from her right breast, but the erythema and swelling is slowly starting to improve.  She underwent IR aspiration of the right breast abscess yesterday, with 8 cc of purulent output obtained.  Objective: Vital signs in last 24 hours: Temp:  [97.7 F (36.5 C)-98.5 F (36.9 C)] 97.7 F (36.5 C) (12/13 0549) Pulse Rate:  [58-66] 62 (12/13 0819) Resp:  [16-20] 20 (12/13 0549) BP: (94-124)/(59-110) 99/62 (12/13 0819) SpO2:  [98 %-100 %] 100 % (12/13 0549) Last BM Date : 01/01/22  Intake/Output from previous day: 12/12 0701 - 12/13 0700 In: 974.1 [P.O.:240; I.V.:484.1; IV Piggyback:250] Out: -  Intake/Output this shift: No intake/output data recorded.  General appearance: alert, cooperative, and no distress Breasts: Right breast with improving edema and erythema; tenderness to palpation around nipple; minimal purulent drainage at 3 o'clock position just lateral to the nipple, no significant area of fluctuance; punch biopsy site with sutures in place  Lab Results:  Recent Labs    01/01/22 0644 01/02/22 0448  WBC 5.6 4.7  HGB 8.6* 9.0*  HCT 30.6* 32.5*  PLT 249 245   BMET Recent Labs    01/01/22 0644 01/02/22 0448  NA 136 135  K 4.1 3.8  CL 99 103  CO2 29 27  GLUCOSE 94 92  BUN 18 18  CREATININE 1.10* 1.12*  CALCIUM 8.4* 8.5*   PT/INR No results for input(s): "LABPROT", "INR" in the last 72 hours.  Studies/Results: US BREAST ASPIRATION RIGHT  Result Date: 01/01/2022 INDICATION: RIGHT BREAST MASTITIS, UNDERLYING ABSCESS, RECENT BIOPSY EXAM: Ultrasound right breast needle aspiration MEDICATIONS: 1% lidocaine local ANESTHESIA/SEDATION: Moderate (conscious) sedation was employed during this procedure. A total of Versed 2.0 mg and Fentanyl 100 mcg was administered intravenously by the radiology nurse. Total  intra-service moderate Sedation Time: 17 minutes. The patient's level of consciousness and vital signs were monitored continuously by radiology nursing throughout the procedure under my direct supervision. COMPLICATIONS: None immediate. PROCEDURE: Informed written consent was obtained from the patient after a thorough discussion of the procedural risks, benefits and alternatives. All questions were addressed. Maximal Sterile Barrier Technique was utilized including caps, mask, sterile gowns, sterile gloves, sterile drape, hand hygiene and skin antiseptic. A timeout was performed prior to the initiation of the procedure. Previous imaging reviewed. Preliminary on performed. Complex retroareolar hypoechoic fluid pocket was localized at the 6 o'clock position. Overlying skin marked. Under sterile conditions and local anesthesia, the Yueh sheath needle was advanced into the hypoechoic fluid collection with direct ultrasound guidance. Needle position confirmed with ultrasound. Images obtained for documentation. 8 cc purulent fluid aspirated. Sample sent for culture. Survey of the breast demonstrates diffuse edema. No additional large fluid pocket localized by ultrasound for additional aspiration or drainage. IMPRESSION: Ultrasound-guided right breast abscess needle aspiration. Electronically Signed   By: Jerilynn Mages.  Shick M.D.   On: 01/01/2022 15:59   US BREAST LTD UNI RIGHT INC AXILLA  Result Date: 12/31/2021 CLINICAL DATA:  RIGHT breast mass. Patient had ultrasound-guided core biopsy of mass in the retroareolar RIGHT breast performed 02/01/2020, showing benign concordant acute and chronic inflammation. Surgical consultation was recommended for discordant findings. More recently, patient presented to the ED secondary to complaints of worsening right breast pain and swelling. She was admitted for further workup. She underwent a punch biopsy of the right breast on 12/29/2021; results are pending. Per  discussion with the  technologist, patient has active drainage of purulent fluid from the MEDIAL aspect of the RIGHT breast, discrete from the biopsy site. EXAM: ULTRASOUND OF THE RIGHT BREAST COMPARISON:  02/01/2020 FINDINGS: Targeted ultrasound is performed, showing marked skin thickening in the circumareolar and retroareolar regions of the RIGHT breast. A discrete hypoechoic collection is contiguous with the thickened skin in the immediate retroareolar region. This collection is 1.8 x 2.7 x 2.0 centimeters. Considerations include inflammatory changes or abscess. IMPRESSION: Suspect retroareolar abscess measuring 2.7 centimeters. RECOMMENDATION: Consider ultrasound-guided aspiration of right breast. Recommend annual screening mammography. Last bilateral mammogram was performed 01/20/2020. BI-RADS CATEGORY  2: Benign. These results will be called to the ordering clinician or representative by the Radiologist Assistant, and communication documented in the PACS or [employer]. Electronically Signed   By: Nolon Nations M.D.   On: 12/31/2021 12:23   Anti-infectives: Anti-infectives (From admission, onward)    Start     Dose/Rate Route Frequency Ordered Stop   12/29/21 2045  vancomycin (VANCOREADY) IVPB 1250 mg/250 mL       See Hyperspace for full Linked Orders Report.   1,250 mg 166.7 mL/hr over 90 Minutes Intravenous Every 12 hours 12/29/21 0748     12/29/21 0845  vancomycin (VANCOREADY) IVPB 2000 mg/400 mL       See Hyperspace for full Linked Orders Report.   2,000 mg 200 mL/hr over 120 Minutes Intravenous  Once 12/29/21 0748 12/29/21 1127   12/28/21 1515  vancomycin (VANCOCIN) IVPB 1000 mg/200 mL premix        1,000 mg 200 mL/hr over 60 Minutes Intravenous  Once 12/28/21 1500 12/28/21 1655       Assessment/Plan:  Patient is a 46 year old female who was admitted with concern for mastitis versus inflammatory breast cancer.   -Punch biopsy performed 12/9-final pathology demonstrating benign breast  parenchyma with acute and chronic inflammation, unremarkable skin, and no malignancy identified -Breast ultrasound demonstrates retroareolar abscess measuring 2.7 cm; she is status post IR aspiration of this abscess -Gram stain demonstrating moderate gram-positive cocci, few gram-negative rods -Continue with IV antibiotics -Patient can be bridged back to her warfarin from general surgery standpoint -No plans for acute surgical intervention at this time -Patient will need further imaging of her right breast to evaluate for possible underlying malignancy, but this will likely need to be done as an outpatient -She will need to follow-up with me in 1 week for suture removal -Appreciate hospitalist recommendations   LOS: 5 days    Rochester Serpe A Sadler Teschner 01/02/2022

## 2022-01-02 NOTE — Progress Notes (Signed)
Pharmacy Antibiotic Note  [name] [name] is a 46 y.o. female admitted on 12/28/2021 with cellulitis. Pharmacy has been consulted for Vancomycin dosing. Wound culture with GNRs, speciation pending. Pharmacy to dose cefepime.  Plan: Add cefepime 2g IV q8h  Height: '5\' 11"'$  (180.3 cm) Weight: (!) 149.2 kg (328 lb 14.8 oz) IBW/kg (Calculated) : 70.8  Temp (24hrs), Avg:98.2 F (36.8 C), Min:97.7 F (36.5 C), Max:98.5 F (36.9 C)  Recent Labs  Lab 12/28/21 1330 12/28/21 1519 12/29/21 0602 12/30/21 0512 12/30/21 2249 12/31/21 0429 12/31/21 0811 01/01/22 0644 01/02/22 0448  WBC 8.1  --  7.0 7.7 6.1 5.8  --  5.6 4.7  CREATININE 0.96  --  0.96 1.03*  --  1.08*  --  1.10* 1.12*  LATICACIDVEN 2.2* 1.9  --   --   --   --   --   --   --   VANCOTROUGH  --   --   --   --   --   --  17  --   --      Estimated Creatinine Clearance: 102.3 mL/min (A) (by C-G formula based on SCr of 1.12 mg/dL (H)).    Allergies  Allergen Reactions   Sulfa Antibiotics Itching, Swelling and Rash    Lip swelling    Antimicrobials this admission: Vancomycin 12/8 >>  Cefepime 12/13 >>  Microbiology results: pending  Thank you for allowing pharmacy to be a part of this patient's care.  Arrie Senate, PharmD, BCPS, [facility] Clinical Pharmacist Please check AMION for all [facility] Pharmacy numbers 01/02/2022

## 2022-01-03 DIAGNOSIS — I340 Nonrheumatic mitral (valve) insufficiency: Secondary | ICD-10-CM

## 2022-01-03 DIAGNOSIS — Z86711 Personal history of pulmonary embolism: Secondary | ICD-10-CM | POA: Diagnosis not present

## 2022-01-03 DIAGNOSIS — D508 Other iron deficiency anemias: Secondary | ICD-10-CM

## 2022-01-03 DIAGNOSIS — I2510 Atherosclerotic heart disease of native coronary artery without angina pectoris: Secondary | ICD-10-CM

## 2022-01-03 DIAGNOSIS — I5043 Acute on chronic combined systolic (congestive) and diastolic (congestive) heart failure: Secondary | ICD-10-CM

## 2022-01-03 DIAGNOSIS — D649 Anemia, unspecified: Secondary | ICD-10-CM

## 2022-01-03 DIAGNOSIS — I5023 Acute on chronic systolic (congestive) heart failure: Secondary | ICD-10-CM

## 2022-01-03 LAB — CBC
HCT: 30.5 % — ABNORMAL LOW (ref 36.0–46.0)
Hemoglobin: 8.5 g/dL — ABNORMAL LOW (ref 12.0–15.0)
MCH: 20.9 pg — ABNORMAL LOW (ref 26.0–34.0)
MCHC: 27.9 g/dL — ABNORMAL LOW (ref 30.0–36.0)
MCV: 74.9 fL — ABNORMAL LOW (ref 80.0–100.0)
Platelets: 241 10*3/uL (ref 150–400)
RBC: 4.07 MIL/uL (ref 3.87–5.11)
RDW: 24.4 % — ABNORMAL HIGH (ref 11.5–15.5)
WBC: 3.8 10*3/uL — ABNORMAL LOW (ref 4.0–10.5)
nRBC: 0 % (ref 0.0–0.2)

## 2022-01-03 LAB — PROTIME-INR
INR: 1.4 — ABNORMAL HIGH (ref 0.8–1.2)
Prothrombin Time: 17.1 seconds — ABNORMAL HIGH (ref 11.4–15.2)

## 2022-01-03 LAB — BASIC METABOLIC PANEL
Anion gap: 7 (ref 5–15)
BUN: 17 mg/dL (ref 6–20)
CO2: 25 mmol/L (ref 22–32)
Calcium: 8.4 mg/dL — ABNORMAL LOW (ref 8.9–10.3)
Chloride: 101 mmol/L (ref 98–111)
Creatinine, Ser: 1.07 mg/dL — ABNORMAL HIGH (ref 0.44–1.00)
GFR, Estimated: >60 mL/min (ref >60)
Glucose, Bld: 93 mg/dL (ref 70–99)
Potassium: 3.7 mmol/L (ref 3.5–5.1)
Sodium: 133 mmol/L — ABNORMAL LOW (ref 135–145)

## 2022-01-03 LAB — HEPARIN LEVEL (UNFRACTIONATED): Heparin Unfractionated: 0.58 IU/mL (ref 0.30–0.70)

## 2022-01-03 LAB — BRAIN NATRIURETIC PEPTIDE: B Natriuretic Peptide: 1232 pg/mL — ABNORMAL HIGH (ref 0.0–100.0)

## 2022-01-03 MED ORDER — FUROSEMIDE 10 MG/ML IJ SOLN
40.0000 mg | Freq: Once | INTRAMUSCULAR | Status: AC
  Administered 2022-01-03: 40 mg via INTRAVENOUS
  Filled 2022-01-03: qty 4

## 2022-01-03 MED ORDER — MIDODRINE HCL 5 MG PO TABS
2.5000 mg | ORAL_TABLET | Freq: Two times a day (BID) | ORAL | Status: DC
  Administered 2022-01-03 – 2022-01-11 (×16): 2.5 mg via ORAL
  Filled 2022-01-03 (×16): qty 1

## 2022-01-03 MED ORDER — METOPROLOL SUCCINATE ER 25 MG PO TB24
25.0000 mg | ORAL_TABLET | Freq: Every day | ORAL | Status: DC
  Administered 2022-01-04 – 2022-01-10 (×7): 25 mg via ORAL
  Filled 2022-01-03 (×8): qty 1

## 2022-01-03 MED ORDER — SODIUM CHLORIDE 0.9 % IV SOLN
250.0000 mg | Freq: Once | INTRAVENOUS | Status: AC
  Administered 2022-01-03: 250 mg via INTRAVENOUS
  Filled 2022-01-03: qty 20

## 2022-01-03 MED ORDER — WARFARIN SODIUM 7.5 MG PO TABS
7.5000 mg | ORAL_TABLET | Freq: Once | ORAL | Status: AC
  Administered 2022-01-03: 7.5 mg via ORAL
  Filled 2022-01-03: qty 1

## 2022-01-03 MED ORDER — LOSARTAN POTASSIUM 25 MG PO TABS
12.5000 mg | ORAL_TABLET | Freq: Every day | ORAL | Status: DC
  Administered 2022-01-04 – 2022-01-11 (×8): 12.5 mg via ORAL
  Filled 2022-01-03 (×8): qty 1

## 2022-01-03 NOTE — Progress Notes (Signed)
ANTICOAGULATION CONSULT NOTE -   Pharmacy Consult for heparin Indication: pulmonary embolus  Allergies  Allergen Reactions   Sulfa Antibiotics Itching, Swelling and Rash    Lip swelling    Patient Measurements: Height: '5\' 11"'$  (180.3 cm) Weight: (!) 149.2 kg (328 lb 14.8 oz) IBW/kg (Calculated) : 70.8 Heparin Dosing Weight: 107kg  Vital Signs: Temp: 97.8 F (36.6 C) (12/14 0517) Temp Source: Oral (12/14 0517) BP: 99/58 (12/14 0517) Pulse Rate: 71 (12/14 0517)  Labs: Recent Labs    01/01/22 0644 01/01/22 2150 01/02/22 0448 01/03/22 0325  HGB 8.6*  --  9.0* 8.5*  HCT 30.6*  --  32.5* 30.5*  PLT 249  --  245 241  LABPROT  --   --   --  17.1*  INR  --   --   --  1.4*  HEPARINUNFRC 0.40 0.42 0.59 0.58  CREATININE 1.10*  --  1.12* 1.07*     Estimated Creatinine Clearance: 107.1 mL/min (A) (by C-G formula based on SCr of 1.07 mg/dL (H)).   Medical History: Past Medical History:  Diagnosis Date   Abdominal cramps 08/30/2015   Anemia    Blood transfusion without reported diagnosis    CAD (coronary artery disease)    Chronic combined systolic and diastolic CHF (congestive heart failure) (HCC)    Closed left ankle fracture    August 30 2012   Clotting disorder [facility])    DVT (deep venous thrombosis) (HCC)    L leg   Fibroid tumor    Hidradenitis    High blood pressure    Hypertension    Mitral regurgitation    NICM (nonischemic cardiomyopathy) (HCC)    NSVT (nonsustained ventricular tachycardia) (HCC)    Obesity    OSA on CPAP    Oxygen deficiency    Pregnancy    09/20/14 17 weeks   Pulmonary embolism ([place]) 04/2013   Sleep apnea    Tobacco use disorder     Medications:  Medications Prior to Admission  Medication Sig Dispense Refill Last Dose   albuterol (VENTOLIN HFA) 108 (90 Base) MCG/ACT inhaler Inhale 2 puffs into the lungs every 2 (two) hours as needed for wheezing or shortness of breath. 18 g 11 12/28/2021   atorvastatin (LIPITOR) 20 MG tablet Take 1  tablet (20 mg total) by mouth daily. (Patient taking differently: Take 20 mg by mouth in the morning.) 90 tablet 3 12/28/2021   furosemide (LASIX) 80 MG tablet Take 1 tablet (80 mg total) by mouth daily. (Patient taking differently: Take 80 mg by mouth in the morning.) 30 tablet 3 12/27/2021   losartan (COZAAR) 25 MG tablet Take 1 tablet (25 mg total) by mouth daily. (Patient taking differently: Take 25 mg by mouth in the morning.) 90 tablet 3 12/28/2021   Magnesium Oxide 400 MG CAPS Take 1 capsule (400 mg total) by mouth daily. (Patient taking differently: Take 400 mg by mouth in the morning.) 90 capsule 3 12/28/2021   metoprolol succinate (TOPROL-XL) 50 MG 24 hr tablet Take 1 tablet (50 mg total) by mouth daily. Take with or immediately following a meal. (Patient taking differently: Take 50 mg by mouth in the morning.) 90 tablet 3 12/28/2021 at 0730   potassium chloride (KLOR-CON) 10 MEQ tablet Take 1 tablet (10 mEq total) by mouth daily. (Patient taking differently: Take 10 mEq by mouth in the morning.) 90 tablet 3 12/28/2021   spironolactone (ALDACTONE) 25 MG tablet Take 0.5 tablets (12.5 mg total) by mouth daily. (Patient  taking differently: Take 12.5 mg by mouth in the morning.) 30 tablet 3 12/28/2021   warfarin (COUMADIN) 10 MG tablet Take 5-10 mg by mouth See admin instructions. Take as directed in the evening : Monday take 10 mg and all other days take 5 mg.   12/28/2021   warfarin (COUMADIN) 5 MG tablet TAKE (1) OR (1) 1/2 TABLET BY MOUTH DAILY AS DIRECTED.   12/28/2021   amoxicillin-clavulanate (AUGMENTIN) 875-125 MG tablet Take 1 tablet by mouth every 12 (twelve) hours. (Patient not taking: Reported on 12/28/2021) 14 tablet 0 Completed Course   HYDROcodone-acetaminophen (NORCO/VICODIN) 5-325 MG tablet Take one tab po q 4 hrs prn pain (Patient not taking: Reported on 12/28/2021) 8 tablet 0 Completed Course   Scheduled:   atorvastatin  20 mg Oral Daily   furosemide  80 mg Oral Daily   losartan  25 mg  Oral Daily   metoprolol succinate  50 mg Oral Daily   potassium chloride  10 mEq Oral q AM   spironolactone  12.5 mg Oral Daily   Warfarin - Pharmacist Dosing Inpatient   Does not apply q1600   Infusions:   ceFEPime (MAXIPIME) IV 2 g (01/03/22 0522)   heparin 2,700 Units/hr (01/02/22 1540)   vancomycin 1,250 mg (01/02/22 2058)    Assessment: Coumadin has been on hold for a hx of PE. Plan to bridge with heparin until surgical intervention is done. right breast aspiration 12/12 at [facility] IR.  Restarting warfarin 12/13.  Home dose listed as 10 mg every Monday and 5 mg ROW.  HL 0.58, therapeutic INR 1.4, subtherapeutic. Will give slight booster dose from '5mg'$  daily Hgb 9.0> 8.5  Goal of Therapy:  Heparin level 0.3-0.7 units/ml Monitor platelets by anticoagulation protocol: Yes   Plan:  Continue Heparin  at 2700 units/hr  Daily heparin level Warfarin 7.5 mg x 1 dose  Daily PT/INR Monitor for S/s of bleeding  Isac Sarna, BS Pharm D, BCPS Clinical Pharmacist 01/03/2022 8:09 AM

## 2022-01-03 NOTE — Progress Notes (Addendum)
PROGRESS NOTE  [name] [name] LGX:[id number] DOB: 1975-10-08 DOA: 12/28/2021 PCP: Carrolyn Meiers, MD    Brief History:  45 y.o. female, known history of DVT PE 2015 IVC filter prior on Xarelto --- recurrence DVT/PE January 2023 status post thrombectomy on lifelong anticoagulation with Coumadin secondary to weight >250 pounds, OSA on CPAP, Known uterine fibroids, Smoker With recent admission to [facility] in sept 2023 due to syncope, felt to be vasovagal, combined CHF history (EF 30-35%).Patient presented to the ED secondary to complaints of worsening right breast pain and swelling, simply discharged on oral Augmentin without much improvement, she was admitted for further workup.    Assessment/Plan:   Cellulitis of right breast with abscess/mastitis -With worsening right breast cellulitis despite being compliant with antibiotics, it is a concern for malignancy, especially with abnormal mammogram last year despite negative biopsy. -Continue with IV vancomycin given she failed oral Augmentin as an outpatient. -Pt going to [facility] 12/12 for IR to have US guided aspiration of right breast abscess.  Holding IV heparin for procedure.   -Medical suspicion for malignancy, so general surgery to perform punch biopsy during hospital stay, awaiting biopsy results  -ED discussed with Dr. Oneida Arenas, oncology, to be followed as an outpatient, as likely further imaging and liver biopsy needed in the outpatient setting.   - Breast US 12/31/21--US aspiration   - 12/9 biopsy>>acute on chronic inflammation -added cefepime for GNR pending final culture data -02/01/2020 right breast bx>>neg malignancy -discussed with Dr. Okey Dupre -outpt follow up with med/onc   Acute on Chronic combined systolic and diastolic CHF - Echocardiogram from 08/23/2021 showed EF dropped to 30 to 35% from 45-50 % back on Jun 05, 2021 -This echo also showed moderate to severe mitral regurg that was directed  posteriorly, -Grade 3 diastolic dysfunction was noted (restrictive pattern) -This echo also showed severe dilatation of the left ventricular internal cavity with LVIDd..  With worsening pulmonary artery pressures (Compared to prior TTE on 05/2021, the EF has dropped to  30-35% from 45-50%, the LV is now severely enlarged, there is now at least  moderate MR (previously mild) and the filling pressures are significantly  elevated. ) -now clinically fluid overloaded -appreciate cardiology consult -restart IV lasix -hypotension has limited GDMT>>lower dose losartan, metoprolol, spiro  DVT/PE (2015/2023) Chronic anticoagulation -INR was reversed after FFP given for biopsy procedure -sp thrombectomy (iliac) and IVC filter -holding warfarin for now in case further procedures needed -surgery said that heparin bridge is ok (temporarily holding 12/12 for procedure) -consulted pharmD to manage heparin bridge  -restart warfarin -cannot use DOAC due to weight   Anemia of iron deficiency -  Hemoglobin down to 7 -  transfused 2 units PRBC 12/11  -  transfuse slowly and provide IV lasix between units -  Iron studies confirm iron deficiency (likely from menorrhagia) -  recheck CBC 12/12 and Hg up to 8.6.    -give nulecit x 2    obesity/OSA/pulmonary hypertension - continue CPAP nightly    Morbid Obesity -BMI 45.88 -lifestyle modification           Family Communication:   no Family at bedside   Consultants:  none   Code Status:  FULL    DVT Prophylaxis:  IV Heparin      Procedures: As Listed in Progress Note Above   Antibiotics: None     Subjective: Pt feels breast is slowly improving but still has some  drainage.  Pain and erythema are improving.  Denies f/c, cp, sob, n/v/d  Objective: Vitals:   01/02/22 2105 01/02/22 2108 01/03/22 0517 01/03/22 1202  BP:  104/84 (!) 99/58 109/71  Pulse:  71 71 74  Resp:  '19 18 20  '$ Temp:  98 F (36.7 C) 97.8 F (36.6 C) 97.9 F  (36.6 C)  TempSrc:   Oral Oral  SpO2: 99% 100% 98% 100%  Weight:      Height:        Intake/Output Summary (Last 24 hours) at 01/03/2022 1814 Last data filed at 01/03/2022 1300 Gross per 24 hour  Intake 1946.6 ml  Output --  Net 1946.6 ml   Weight change:  Exam:  General:  Pt is alert, follows commands appropriately, not in acute distress HEENT: No icterus, No thrush, No neck mass, [place]/AT Cardiovascular: RRR, S1/S2, no rubs, no gallops Respiratory: CTA bilaterally, no wheezing, no crackles, no rhonchi Abdomen: Soft/+BS, non tender, non distended, no guarding Extremities: 2 +LE edema, No lymphangitis, No petechiae, No rashes, no synovitis   Data Reviewed: I have personally reviewed following labs and imaging studies Basic Metabolic Panel: Recent Labs  Lab 12/30/21 0512 12/31/21 0429 01/01/22 0644 01/02/22 0448 01/03/22 0325  NA 134* 133* 136 135 133*  K 3.7 3.1* 4.1 3.8 3.7  CL 100 99 99 103 101  CO2 '29 26 29 27 25  '$ GLUCOSE 81 118* 94 92 93  BUN '17 20 18 18 17  '$ CREATININE 1.03* 1.08* 1.10* 1.12* 1.07*  CALCIUM 8.4* 7.9* 8.4* 8.5* 8.4*  MG  --  1.8 2.1  --   --    Liver Function Tests: No results for input(s): "AST", "ALT", "ALKPHOS", "BILITOT", "PROT", "ALBUMIN" in the last 168 hours. No results for input(s): "LIPASE", "AMYLASE" in the last 168 hours. No results for input(s): "AMMONIA" in the last 168 hours. Coagulation Profile: Recent Labs  Lab 12/28/21 2350 12/29/21 0602 12/29/21 1538 12/30/21 0512 01/03/22 0325  INR 2.1* 1.8* 1.6* 1.9* 1.4*   CBC: Recent Labs  Lab 12/28/21 1330 12/29/21 0602 12/30/21 2249 12/31/21 0429 01/01/22 0644 01/02/22 0448 01/03/22 0325  WBC 8.1   < > 6.1 5.8 5.6 4.7 3.8*  NEUTROABS 6.5  --   --   --   --   --   --   HGB 8.7*   < > 8.2* 7.0* 8.6* 9.0* 8.5*  HCT 31.7*   < > 30.1* 25.7* 30.6* 32.5* 30.5*  MCV 71.4*   < > 71.5* 70.8* 73.4* 74.5* 74.9*  PLT 275   < > 279 236 249 245 241   < > = values in this interval  not displayed.   Cardiac Enzymes: No results for input(s): "CKTOTAL", "CKMB", "CKMBINDEX", "TROPONINI" in the last 168 hours. BNP: Invalid input(s): "POCBNP" CBG: No results for input(s): "GLUCAP" in the last 168 hours. HbA1C: No results for input(s): "HGBA1C" in the last 72 hours. Urine analysis:    Component Value Date/Time   COLORURINE YELLOW 09/22/2021 1506   APPEARANCEUR HAZY (A) 09/22/2021 1506   APPEARANCEUR Cloudy (A) 07/21/2014 1326   LABSPEC 1.008 09/22/2021 1506   PHURINE 6.0 09/22/2021 1506   GLUCOSEU NEGATIVE 09/22/2021 1506   HGBUR NEGATIVE 09/22/2021 1506   BILIRUBINUR NEGATIVE 09/22/2021 1506   BILIRUBINUR Negative 07/21/2014 1326   KETONESUR NEGATIVE 09/22/2021 1506   PROTEINUR NEGATIVE 09/22/2021 1506   UROBILINOGEN 0.2 06/30/2014 0910   NITRITE NEGATIVE 09/22/2021 1506   LEUKOCYTESUR TRACE (A) 09/22/2021 1506   Sepsis Labs: @  LABRCNTIP(procalcitonin:4,lacticidven:4) ) Recent Results (from the past 240 hour(s))  Aerobic/Anaerobic Culture w Gram Stain (surgical/deep wound)     Status: None (Preliminary result)   Collection Time: 01/01/22 12:22 PM   Specimen: Abscess  Result Value Ref Range Status   Specimen Description ABSCESS  Final   Special Requests  BREAST RIGHT  Final   Gram Stain   Final    ABUNDANT WBC PRESENT, PREDOMINANTLY PMN MODERATE GRAM POSITIVE COCCI FEW GRAM NEGATIVE RODS    Culture   Final    HOLDING FOR POSSIBLE ANAEROBE Performed at [facility], [address], [place], [place] [address]    Report Status PENDING  Incomplete     Scheduled Meds:  atorvastatin  20 mg Oral Daily   [START ON 01/04/2022] losartan  12.5 mg Oral Daily   [START ON 01/04/2022] metoprolol succinate  25 mg Oral Daily   midodrine  2.5 mg Oral BID WC   potassium chloride  10 mEq Oral q AM   spironolactone  12.5 mg Oral Daily   Warfarin - Pharmacist Dosing Inpatient   Does not apply q1600   Continuous Infusions:  ceFEPime (MAXIPIME) IV 2 g  (01/03/22 1211)   heparin 2,700 Units/hr (01/03/22 1005)   vancomycin 1,250 mg (01/03/22 0813)    Procedures/Studies: US BREAST ASPIRATION RIGHT  Result Date: 01/01/2022 INDICATION: RIGHT BREAST MASTITIS, UNDERLYING ABSCESS, RECENT BIOPSY EXAM: Ultrasound right breast needle aspiration MEDICATIONS: 1% lidocaine local ANESTHESIA/SEDATION: Moderate (conscious) sedation was employed during this procedure. A total of Versed 2.0 mg and Fentanyl 100 mcg was administered intravenously by the radiology nurse. Total intra-service moderate Sedation Time: 17 minutes. The patient's level of consciousness and vital signs were monitored continuously by radiology nursing throughout the procedure under my direct supervision. COMPLICATIONS: None immediate. PROCEDURE: Informed written consent was obtained from the patient after a thorough discussion of the procedural risks, benefits and alternatives. All questions were addressed. Maximal Sterile Barrier Technique was utilized including caps, mask, sterile gowns, sterile gloves, sterile drape, hand hygiene and skin antiseptic. A timeout was performed prior to the initiation of the procedure. Previous imaging reviewed. Preliminary on performed. Complex retroareolar hypoechoic fluid pocket was localized at the 6 o'clock position. Overlying skin marked. Under sterile conditions and local anesthesia, the Yueh sheath needle was advanced into the hypoechoic fluid collection with direct ultrasound guidance. Needle position confirmed with ultrasound. Images obtained for documentation. 8 cc purulent fluid aspirated. Sample sent for culture. Survey of the breast demonstrates diffuse edema. No additional large fluid pocket localized by ultrasound for additional aspiration or drainage. IMPRESSION: Ultrasound-guided right breast abscess needle aspiration. Electronically Signed   By: Jerilynn Mages.  Shick M.D.   On: 01/01/2022 15:59   US BREAST LTD UNI RIGHT INC AXILLA  Result Date:  12/31/2021 CLINICAL DATA:  RIGHT breast mass. Patient had ultrasound-guided core biopsy of mass in the retroareolar RIGHT breast performed 02/01/2020, showing benign concordant acute and chronic inflammation. Surgical consultation was recommended for discordant findings. More recently, patient presented to the ED secondary to complaints of worsening right breast pain and swelling. She was admitted for further workup. She underwent a punch biopsy of the right breast on 12/29/2021; results are pending. Per discussion with the technologist, patient has active drainage of purulent fluid from the MEDIAL aspect of the RIGHT breast, discrete from the biopsy site. EXAM: ULTRASOUND OF THE RIGHT BREAST COMPARISON:  02/01/2020 FINDINGS: Targeted ultrasound is performed, showing marked skin thickening in the circumareolar and retroareolar regions of the RIGHT breast.  A discrete hypoechoic collection is contiguous with the thickened skin in the immediate retroareolar region. This collection is 1.8 x 2.7 x 2.0 centimeters. Considerations include inflammatory changes or abscess. IMPRESSION: Suspect retroareolar abscess measuring 2.7 centimeters. RECOMMENDATION: Consider ultrasound-guided aspiration of right breast. Recommend annual screening mammography. Last bilateral mammogram was performed 01/20/2020. BI-RADS CATEGORY  2: Benign. These results will be called to the ordering clinician or representative by the Radiologist Assistant, and communication documented in the PACS or [employer]. Electronically Signed   By: Nolon Nations M.D.   On: 12/31/2021 12:23  CT Chest W Contrast  Result Date: 12/16/2021 CLINICAL DATA:  Chest wall mass EXAM: CT CHEST WITH CONTRAST TECHNIQUE: Multidetector CT imaging of the chest was performed during intravenous contrast administration. RADIATION DOSE REDUCTION: This exam was performed according to the departmental dose-optimization program which includes automated exposure control,  adjustment of the mA and/or kV according to patient size and/or use of iterative reconstruction technique. CONTRAST:  32m OMNIPAQUE IOHEXOL 300 MG/ML  SOLN COMPARISON:  Mammogram 01/20/2020, breast ultrasound 02/01/2020, 01/20/2020, chest CT 06/05/2021. Chest x-ray 12/16/2021 FINDINGS: Cardiovascular: Nonaneurysmal aorta. Borderline cardiomegaly. No pericardial effusion. Mediastinum/Nodes: Midline trachea. No thyroid mass. Esophagus within normal limits. Multiple enlarged right axillary lymph nodes measuring up to 2.3 cm. Lungs/Pleura: No acute airspace disease, pleural effusion, or pneumothorax. Mild subpleural reticulation within the right anterior upper lobe. Upper Abdomen: Small volume ascites adjacent to the liver and spleen. Generalized mesenteric stranding. Partially visualized IVC filter. 2 cm hyperenhancing area in the central liver, series 2, image 154. This appears present on 06/05/2021 chest CT with suggestion of peripheral enhancement as may be seen with hemangioma. Musculoskeletal: No acute osseous abnormality. Marked diffuse skin thickening and generalized edema and stranding throughout the entirety of the right breast. No discrete rim enhancing collection by CT. The nipple areola complex is poorly defined but suspect that the nipple is retracted. There is generalized edema throughout the subcutaneous soft tissues consistent with anasarca. IMPRESSION: 1. Marked diffuse skin thickening involving the right breast with generalized edema and soft tissue stranding. Poorly defined nipple areola complex but suspect that the nipple is retracted, correlate with direct inspection. Primary differential considerations include inflammatory breast cancer versus mastitis. Surgical consultation is recommended. There are multiple enlarged right axillary lymph nodes. 2. There is considerable generalized subcutaneous edema diffusely consistent with anasarca. There is small volume ascites within the upper abdomen. 3.  Hyperenhancing lesion within the central liver, potential hemangioma. When the patient is clinically stable and able to follow directions and hold their breath (preferably as an outpatient) further evaluation with dedicated abdominal MRI should be considered. Electronically Signed   By: KDonavan FoilM.D.   On: 12/16/2021 16:13   DG Chest 2 View  Result Date: 12/16/2021 CLINICAL DATA:  cough EXAM: CHEST - 2 VIEW COMPARISON:  09/14/2018 FINDINGS: Cardiac silhouette enlarged. No evidence of pneumothorax or pleural effusion. No evidence of pulmonary edema or pneumonia. There are thoracic degenerative changes IMPRESSION: Enlarged cardiac silhouette.  No focal consolidation Electronically Signed   By: JSammie BenchM.D.   On: 12/16/2021 12:01    DOrson Eva DO  [facility]  If 7PM-7AM, please contact night-coverage [email or web address] Password [email or web address] 01/03/2022, 6:14 PM   LOS: 6 days

## 2022-01-03 NOTE — Consult Note (Addendum)
Cardiology Consultation   Patient ID: [name] [name] MRN: [id number]; DOB: 1975-10-17  Admit date: 12/28/2021 Date of Consult: 01/03/2022  PCP:  Carrolyn Meiers, [place] [facility] Cardiologist:  Carlyle Dolly, MD  Electrophysiologist:  Melida Quitter, MD       Patient Profile:   [name] [name] is a 46 y.o. female with a hx of HFrEF (EF 45-50% in 05/2021 with cath showing 40-50% proximal RCA stenosis but otherwise normal cors, EF at 30-35% in 08/2021 and 09/2021), moderate to severe MR, history of recurrent PE/DVT, moderate to severe MR, HTN, NSVT and OSA (on CPAP) who is being seen 01/03/2022 for the evaluation of an acute CHF exacerbation at the request of Dr. Carles Collet.  History of Present Illness:   [name] [name] was examined by Melina Copa, PA in 10/2021 and reported NYHA Class 2-3 dyspnea but no chest pain or dyspnea. Reported previously consuming several alcoholic drinks a day but had reduced her use earlier in the year. She did report syncope and recent monitor showed predominately NSR with short runs of SVT and an 8 beat and 4 beat run of NSVT. Was continued on Lasix '80mg'$  daily, Toprol-XL '25mg'$  daily and Spironolactone 12.'5mg'$  daily with plans to add an ARB and SGLT2 inhibitor in the future. The patient was reviewed with Dr. Harl Bowie and it was recommended to pursue a cMRI and refer to the [facility]. She did see Dr. Myles Gip in 11/2021 and was recommend to pursue ICD placement if EF remained reduced by follow-up echo.   She presented to [facility] ED on 12/28/2021 for evaluation of worsening right breast pain and swelling which started the week prior. Symptoms were felt to be most consistent with cellulitis along her left breast and she was admitted for further management of this. Coumadin was held with Heparin bridging and she underwent a punch biopsy by general surgery on 12/29/2021. Pathology results showed benign breast parenchyma with acute  and chronic inflammation. US imaging did show a retroareolar abscess and she underwent aspiration by IR on 01/01/2022. Hospitalization has also been complicated by iron deficiency anemia with hemoglobin down to 7 and she did receive 2 units pRBCs' on 12/31/2021 with Hgb at 8.5 today.   Cardiology is consulted today for an acute CHF exacerbation. BNP today elevated to 1232. She did receive IV Lasix '20mg'$  and '40mg'$  on 12/9 with an additional dose of IV Lasix '20mg'$  on 12/31/2021. Otherwise, she has been continued on PO Lasix '80mg'$  daily.   In talking with the patient today, she reports her respiratory status has overall been stable and denies any specific orthopnea or PND. She has noticed worsening lower extremity edema this hospitalization. No recent exertional chest pain or palpitations. Reports she was previously having very frequent urination with PO Lasix at home prior to admission. She does feel bloated along her abdomen but is unsure if this is due to CHF or cellulitis along her breast.  Past Medical History:  Diagnosis Date   Abdominal cramps 08/30/2015   Anemia    Blood transfusion without reported diagnosis    CAD (coronary artery disease)    Chronic combined systolic and diastolic CHF (congestive heart failure) (HCC)    Closed left ankle fracture    August 30 2012   Clotting disorder (HCC)    DVT (deep venous thrombosis) (HCC)    L leg   Fibroid tumor    Hidradenitis    High blood pressure  Hypertension    Mitral regurgitation    NICM (nonischemic cardiomyopathy) (HCC)    NSVT (nonsustained ventricular tachycardia) (HCC)    Obesity    OSA on CPAP    Oxygen deficiency    Pregnancy    09/20/14 17 weeks   Pulmonary embolism ([place]) 04/2013   Sleep apnea    Tobacco use disorder     Past Surgical History:  Procedure Laterality Date   ABSCESS DRAINAGE Bilateral 01/10/15   COLONOSCOPY WITH PROPOFOL N/A 06/08/2018   Procedure: COLONOSCOPY WITH PROPOFOL;  Surgeon: Daneil Dolin, MD;   Location: [facility];  Service: Endoscopy;  Laterality: N/A;  2:30pm   CYST REMOVAL TRUNK     IVC FILTER PLACEMENT ([place] HX)     LOWER EXTREMITY VENOGRAPHY N/A 02/07/2021   Procedure: LOWER EXTREMITY VENOGRAPHY;  Surgeon: [name], MD;  Location: [place] [facility];  Service: Cardiovascular;  Laterality: N/A;   PERIPHERAL VASCULAR THROMBECTOMY N/A 02/07/2021   Procedure: PERIPHERAL VASCULAR THROMBECTOMY;  Surgeon: [name], MD;  Location: [place] [facility];  Service: Cardiovascular;  Laterality: N/A;   RIGHT/LEFT HEART CATH AND CORONARY ANGIOGRAPHY N/A 06/19/2021   Procedure: RIGHT/LEFT HEART CATH AND CORONARY ANGIOGRAPHY;  Surgeon: Belva Crome, MD;  Location: [place] [facility];  Service: Cardiovascular;  Laterality: N/A;     Home Medications:  Prior to Admission medications   Medication Sig Start Date End Date Taking? Authorizing Provider  albuterol (VENTOLIN HFA) 108 (90 Base) MCG/ACT inhaler Inhale 2 puffs into the lungs every 2 (two) hours as needed for wheezing or shortness of breath. 09/13/21  Yes Rigoberto Noel, MD  atorvastatin (LIPITOR) 20 MG tablet Take 1 tablet (20 mg total) by mouth daily. Patient taking differently: Take 20 mg by mouth in the morning. 09/10/21  Yes Branch, Alphonse Guild, MD  furosemide (LASIX) 80 MG tablet Take 1 tablet (80 mg total) by mouth daily. Patient taking differently: Take 80 mg by mouth in the morning. 06/23/21  Yes Regalado, Belkys A, MD  losartan (COZAAR) 25 MG tablet Take 1 tablet (25 mg total) by mouth daily. Patient taking differently: Take 25 mg by mouth in the morning. 11/14/21 11/09/22 Yes Dunn, Nedra Hai, PA-C  Magnesium Oxide 400 MG CAPS Take 1 capsule (400 mg total) by mouth daily. Patient taking differently: Take 400 mg by mouth in the morning. 11/14/21  Yes Dunn, Dayna N, PA-C  metoprolol succinate (TOPROL-XL) 50 MG 24 hr tablet Take 1 tablet (50 mg total) by mouth daily. Take with or immediately following a meal. Patient  taking differently: Take 50 mg by mouth in the morning. 11/14/21 11/09/22 Yes Dunn, Dayna N, PA-C  potassium chloride (KLOR-CON) 10 MEQ tablet Take 1 tablet (10 mEq total) by mouth daily. Patient taking differently: Take 10 mEq by mouth in the morning. 11/14/21 11/09/22 Yes Dunn, Dayna N, PA-C  spironolactone (ALDACTONE) 25 MG tablet Take 0.5 tablets (12.5 mg total) by mouth daily. Patient taking differently: Take 12.5 mg by mouth in the morning. 06/22/21  Yes Regalado, Belkys A, MD  warfarin (COUMADIN) 10 MG tablet Take 5-10 mg by mouth See admin instructions. Take as directed in the evening : Monday take 10 mg and all other days take 5 mg.   Yes [provider]  warfarin (COUMADIN) 5 MG tablet TAKE (1) OR (1) 1/2 TABLET BY MOUTH DAILY AS DIRECTED. 09/25/21  Yes Nita Sells, MD  amoxicillin-clavulanate (AUGMENTIN) 875-125 MG tablet Take 1 tablet by mouth every 12 (  twelve) hours. Patient not taking: Reported on 12/28/2021 12/16/21   Kem Parkinson, PA-C  HYDROcodone-acetaminophen (NORCO/VICODIN) 5-325 MG tablet Take one tab po q 4 hrs prn pain Patient not taking: Reported on 12/28/2021 12/16/21   Kem Parkinson, PA-C    Inpatient Medications: Scheduled Meds:  atorvastatin  20 mg Oral Daily   furosemide  80 mg Oral Daily   losartan  25 mg Oral Daily   metoprolol succinate  50 mg Oral Daily   potassium chloride  10 mEq Oral q AM   spironolactone  12.5 mg Oral Daily   warfarin  7.5 mg Oral ONCE-1600   Warfarin - Pharmacist Dosing Inpatient   Does not apply q1600   Continuous Infusions:  ceFEPime (MAXIPIME) IV 2 g (01/03/22 0522)   heparin 2,700 Units/hr (01/03/22 1005)   vancomycin 1,250 mg (01/03/22 0813)   PRN Meds: acetaminophen **OR** acetaminophen, albuterol, oxyCODONE  Allergies:    Allergies  Allergen Reactions   Sulfa Antibiotics Itching, Swelling and Rash    Lip swelling    Social History:   Social History   Socioeconomic History   Marital status:  Widowed    Spouse name: Not on file   Number of children: Not on file   Years of education: Not on file   Highest education level: Not on file  Occupational History   Not on file  Tobacco Use   Smoking status: Some Days    Packs/day: 0.25    Years: 18.00    Total pack years: 4.50    Types: Cigarettes   Smokeless tobacco: Never   Tobacco comments:    smokes 3 cig a day ARJ 02/15/21  Vaping Use   Vaping Use: Never used  Substance and Sexual Activity   Alcohol use: Yes    Alcohol/week: 2.0 standard drinks of alcohol    Types: 2 Cans of beer per week    Comment: twice a month   Drug use: No   Sexual activity: Not Currently    Birth control/protection: Pill, None  Other Topics Concern   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    Has a 45 year old [place].   Live with parents.   Was working 5 days a weeks.   Not working right now.    Attends church.    Social Determinants of Health   Financial Resource Strain: Not on file  Food Insecurity: Not on file  Transportation Needs: Not on file  Physical Activity: Not on file  Stress: Not on file  Social Connections: Not on file  Intimate Partner Violence: Not on file    Family History:    Family History  Problem Relation Age of Onset   Hypertension Mother    Diabetes Paternal Aunt    Diabetes Paternal Uncle    Other Father        blood clots   Cancer Maternal Grandmother    Heart attack Paternal Grandmother    Colon cancer Neg Hx      ROS:  Please see the history of present illness.  All other ROS reviewed and negative.     Physical Exam/Data:   Vitals:   01/02/22 1354 01/02/22 2105 01/02/22 2108 01/03/22 0517  BP: 112/77  104/84 (!) 99/58  Pulse: 67  71 71  Resp: '19  19 18  '$ Temp: 98.4 F (36.9 C)  98 F (36.7 C) 97.8 F (36.6 C)  TempSrc:  Oral  SpO2: 99% 99% 100% 98%  Weight:      Height:        Intake/Output Summary (Last 24 hours) at  01/03/2022 1026 Last data filed at 01/03/2022 0900 Gross per 24 hour  Intake 1946.6 ml  Output --  Net 1946.6 ml      12/28/2021    9:37 AM 12/20/2021    9:03 AM 12/16/2021   11:44 AM  Last 3 Weights  Weight (lbs) 328 lb 14.8 oz 329 lb 273 lb  Weight (kg) 149.2 kg 149.233 kg 123.832 kg     Body mass index is 45.88 kg/m.  General: Pleasant female appearing in no acute distress HEENT: normal Neck: no JVD Vascular: No carotid bruits; Distal pulses 2+ bilaterally Cardiac:  normal S1, S2; RRR;  Lungs:  clear to auscultation bilaterally, no wheezing, rhonchi or rales  Abd: soft, nontender, no hepatomegaly  Ext: 2+ pitting edema bilaterally.  Musculoskeletal:  No deformities, BUE and BLE strength normal and equal Skin: Significant swelling along right breast.  Neuro:  CNs 2-12 intact, no focal abnormalities noted Psych:  Normal affect   EKG:  No new tracings this admission.   Relevant CV Studies:  R/LHC: 05/2021 CONCLUSIONS: 40 to 50% proximal right coronary narrowing. Right dominant anatomy Coronary arteries otherwise normal Significant elevation LVEDP 30 mmHg consistent with acute on chronic combined systolic and diastolic heart failure Mild pulmonary hypertension, mean pressure 30 mmHg.  WHO group II etiology based on hemodysnamics.. Capillary wedge mean 24 mmHg.  V wave to 40 mmHg suggesting some degree of mitral regurgitation. Cardiac output 8.5 L/min with index 3.57 L/min/m. Pulmonary resistance 0.7 Wood units.   RECOMMENDATIONS: Care with IV fluid administration. Needs diuresis. Start heart failure therapy including ARNI and SGLT2 as tolerated.     Echocardiogram: 09/2021 IMPRESSIONS     1. Left ventricular ejection fraction, by estimation, is 30 to 35%. The  left ventricle has moderately decreased function. Left ventricular  endocardial border not optimally defined to evaluate regional wall motion,  global hypokinesis seen. The left  ventricular internal  cavity size was moderately dilated. There is mild  left ventricular hypertrophy. Left ventricular diastolic parameters are  indeterminate.   2. Right ventricular systolic function is mildly reduced. The right  ventricular size is mildly enlarged. There is mildly elevated pulmonary  artery systolic pressure. The estimated right ventricular systolic  pressure is 26.9 mmHg.   3. Left atrial size was moderately dilated.   4. Right atrial size was mildly dilated.   5. MR mechanism appears to be posterior leaflet restriction with relative  anterior leaflet override with posteriorly directed jet of MR. Review of  serial studies suggests regional wall motion abnormalities contribute to  posterior leaflet restriction. No   pulmonary vein reversal seen on this study, systolic PV blunting.. The  mitral valve is grossly normal. Moderate to severe mitral valve  regurgitation. No evidence of mitral stenosis.   6. Tricuspid valve regurgitation is mild to moderate.   7. The aortic valve is tricuspid. Aortic valve regurgitation is not  visualized. No aortic stenosis is present.   8. The inferior vena cava is dilated in size with <50% respiratory  variability, suggesting right atrial pressure of 15 mmHg.    Laboratory Data:  High Sensitivity Troponin:  No results for input(s): "TROPONINIHS" in the last 720 hours.   Chemistry Recent Labs  Lab 12/31/21 0429 01/01/22 0644 01/02/22 0448 01/03/22 0325  NA 133* 136 135 133*  K 3.1*  4.1 3.8 3.7  CL 99 99 103 101  CO2 '26 29 27 25  '$ GLUCOSE 118* 94 92 93  BUN '20 18 18 17  '$ CREATININE 1.08* 1.10* 1.12* 1.07*  CALCIUM 7.9* 8.4* 8.5* 8.4*  MG 1.8 2.1  --   --   GFRNONAA >60 >60 >60 >60  ANIONGAP '8 8 5 7    '$ Hematology Recent Labs  Lab 01/01/22 0644 01/02/22 0448 01/03/22 0325  WBC 5.6 4.7 3.8*  RBC 4.17 4.36 4.07  HGB 8.6* 9.0* 8.5*  HCT 30.6* 32.5* 30.5*  MCV 73.4* 74.5* 74.9*  MCH 20.6* 20.6* 20.9*  MCHC 28.1* 27.7* 27.9*  RDW 23.0* 23.6*  24.4*  PLT 249 245 241   BNP Recent Labs  Lab 01/03/22 0325  BNP 1,232.0*    Radiology/Studies:  US BREAST ASPIRATION RIGHT  Result Date: 01/01/2022 INDICATION: RIGHT BREAST MASTITIS, UNDERLYING ABSCESS, RECENT BIOPSY EXAM: Ultrasound right breast needle aspiration MEDICATIONS: 1% lidocaine local ANESTHESIA/SEDATION: Moderate (conscious) sedation was employed during this procedure. A total of Versed 2.0 mg and Fentanyl 100 mcg was administered intravenously by the radiology nurse. Total intra-service moderate Sedation Time: 17 minutes. The patient's level of consciousness and vital signs were monitored continuously by radiology nursing throughout the procedure under my direct supervision. COMPLICATIONS: None immediate. PROCEDURE: Informed written consent was obtained from the patient after a thorough discussion of the procedural risks, benefits and alternatives. All questions were addressed. Maximal Sterile Barrier Technique was utilized including caps, mask, sterile gowns, sterile gloves, sterile drape, hand hygiene and skin antiseptic. A timeout was performed prior to the initiation of the procedure. Previous imaging reviewed. Preliminary on performed. Complex retroareolar hypoechoic fluid pocket was localized at the 6 o'clock position. Overlying skin marked. Under sterile conditions and local anesthesia, the Yueh sheath needle was advanced into the hypoechoic fluid collection with direct ultrasound guidance. Needle position confirmed with ultrasound. Images obtained for documentation. 8 cc purulent fluid aspirated. Sample sent for culture. Survey of the breast demonstrates diffuse edema. No additional large fluid pocket localized by ultrasound for additional aspiration or drainage. IMPRESSION: Ultrasound-guided right breast abscess needle aspiration. Electronically Signed   By: Jerilynn Mages.  Shick M.D.   On: 01/01/2022 15:59   US BREAST LTD UNI RIGHT INC AXILLA  Result Date: 12/31/2021 CLINICAL DATA:   RIGHT breast mass. Patient had ultrasound-guided core biopsy of mass in the retroareolar RIGHT breast performed 02/01/2020, showing benign concordant acute and chronic inflammation. Surgical consultation was recommended for discordant findings. More recently, patient presented to the ED secondary to complaints of worsening right breast pain and swelling. She was admitted for further workup. She underwent a punch biopsy of the right breast on 12/29/2021; results are pending. Per discussion with the technologist, patient has active drainage of purulent fluid from the MEDIAL aspect of the RIGHT breast, discrete from the biopsy site. EXAM: ULTRASOUND OF THE RIGHT BREAST COMPARISON:  02/01/2020 FINDINGS: Targeted ultrasound is performed, showing marked skin thickening in the circumareolar and retroareolar regions of the RIGHT breast. A discrete hypoechoic collection is contiguous with the thickened skin in the immediate retroareolar region. This collection is 1.8 x 2.7 x 2.0 centimeters. Considerations include inflammatory changes or abscess. IMPRESSION: Suspect retroareolar abscess measuring 2.7 centimeters. RECOMMENDATION: Consider ultrasound-guided aspiration of right breast. Recommend annual screening mammography. Last bilateral mammogram was performed 01/20/2020. BI-RADS CATEGORY  2: Benign. These results will be called to the ordering clinician or representative by the Radiologist Assistant, and communication documented in the PACS or [employer]. Electronically Signed  By: Nolon Nations M.D.   On: 12/31/2021 12:23    Assessment and Plan:   1. Acute on Chronic HFrEF - She has a known NICM with catheterization in 05/2021 showing nonobstructive disease as outlined above and her EF was at 30 to 35% by most recent imaging in 09/2021. As discussed during prior office visits, titration of medical therapy has been limited given her baseline hypotension. - She is currently scheduled to receive Losartan 25  mg daily, Toprol-XL 50 mg daily and Spironolactone 12.5 mg daily but Toprol-XL and Losartan have been held for the past 2 days given her intermittent hypotension. Will reduce dosing of Losartan to 12.5 mg daily and Toprol-XL to 25 mg daily. Pending BP trend, could consider the use of Midodrine to assist with BP.  Doubt BP will allow for switching Losartan to Entresto.  Would not be an ideal candidate for SGLT2 inhibitor therapy at this time given her current infection. Given her volume overload on examination, will dose IV Lasix '40mg'$  x 1 and assess response as she already received PO Lasix '80mg'$  today.   2. CAD - Cath in 05/2021 showed 40-50% proximal RCA stenosis but otherwise normal cors. No recent anginal symptoms. Remains on Atorvastatin '20mg'$  daily. Not on ASA given the need for anticoagulation.   3. Mitral Regurgitation - This was moderate to severe by most recent echocardiogram in 09/2021 and felt to be functional given her CHF. Was recommended to continue to optimize GDMT and hold off on TEE for now.  4. History of PE/DVT - On Coumadin prior to admission and this was held for her procedures this admission. Coumadin has been restarted with Heparin bridging. Being managed by Pharmacy.   5. Cellulitis of Right Breast/Abscess - Biopsy this admission showed benign breast parenchyma with acute and chronic inflammation but US imaging did show a retroareolar abscess and she underwent aspiration by IR on 01/01/2022.  - Remains on Cefepime and Vancomycin. Management per the admitting team.   6. Anemia - She did receive 2 unit's pRBC's this admission. Hgb at 8.5 today.   For questions or updates, please contact [place] Please consult [email or web address] for contact info under    Signed, Erma Heritage, PA-C  01/03/2022 10:26 AM    Attending note:  Patient seen and examined.  I reviewed her records and discussed the case with Ms. Delano Metz, I agree with her above findings.  She  is currently hospitalized for treatment of breast cellulitis, status postbiopsy and also aspiration, temporarily off Coumadin with heparin bridge.  Hospital course complicated by acute on chronic anemia requiring PRBC transfusion.  She also mentions trouble with progressive leg swelling actually predating her current hospital stay despite consistent use of oral Lasix.  She has known ischemic cardiomyopathy with HFrEF, LVEF most recently 30 to 35% range with GDMT limited by low normal blood pressure to mild hypotension.  She also has moderate to severe mitral regurgitation.  On examination this morning she is in no distress.  No shortness of breath at rest.  Afebrile with heart rate in the 60s to 70s in sinus rhythm by telemetry which I personally reviewed.  Recent systolics running 86P to low 100s.  Lungs exhibit decreased breath sounds.  Cardiac exam with indistinct PMI and 2/6 apical systolic murmur.  She has bilateral firm leg edema/lymphedema.  Pertinent lab work includes potassium 3.7, BUN 17, creatinine 1.07, BNP 1232, hemoglobin 8.5, platelets 241, INR 1.4.  HFrEF with nonischemic cardiomyopathy and LVEF 30  to 35% range, also associated moderate to severe mitral regurgitation.  She has evidence of fluid overload with bilateral leg edema/lymphedema.  Relatively low blood pressure has limited GDMT in general.  Currently being treated for right breast cellulitis and abscess, also received 2 units of PRBCs for treatment of anemia.  Plan to give dose of IV Lasix today and reassess urine output tomorrow to determine standing dose.  Would place Unna boots.  Also starting low-dose midodrine and attempt to try and keep her on basic GDMT (this has been inconsistent with doses held including at home).  Reduce Cozaar to 12.5 mg daily and Toprol-XL to 25 mg daily.  Continue Aldactone.  Doubt that she would tolerate switch to [facility] and not a good candidate for SGLT2 inhibitor in light of active  infection.  Satira Sark, M.D., F.A.C.C.

## 2022-01-04 ENCOUNTER — Ambulatory Visit: Payer: Medicare HMO | Admitting: Pulmonary Disease

## 2022-01-04 ENCOUNTER — Inpatient Hospital Stay (HOSPITAL_COMMUNITY): Payer: Medicare HMO

## 2022-01-04 DIAGNOSIS — I10 Essential (primary) hypertension: Secondary | ICD-10-CM | POA: Diagnosis not present

## 2022-01-04 DIAGNOSIS — G894 Chronic pain syndrome: Secondary | ICD-10-CM | POA: Diagnosis not present

## 2022-01-04 DIAGNOSIS — I5043 Acute on chronic combined systolic (congestive) and diastolic (congestive) heart failure: Secondary | ICD-10-CM | POA: Diagnosis not present

## 2022-01-04 DIAGNOSIS — N61 Mastitis without abscess: Secondary | ICD-10-CM | POA: Diagnosis not present

## 2022-01-04 DIAGNOSIS — I5023 Acute on chronic systolic (congestive) heart failure: Secondary | ICD-10-CM | POA: Diagnosis not present

## 2022-01-04 LAB — BASIC METABOLIC PANEL
Anion gap: 7 (ref 5–15)
BUN: 15 mg/dL (ref 6–20)
CO2: 26 mmol/L (ref 22–32)
Calcium: 8.7 mg/dL — ABNORMAL LOW (ref 8.9–10.3)
Chloride: 103 mmol/L (ref 98–111)
Creatinine, Ser: 0.97 mg/dL (ref 0.44–1.00)
GFR, Estimated: >60 mL/min (ref >60)
Glucose, Bld: 87 mg/dL (ref 70–99)
Potassium: 3.7 mmol/L (ref 3.5–5.1)
Sodium: 136 mmol/L (ref 135–145)

## 2022-01-04 LAB — CBC
HCT: 28.9 % — ABNORMAL LOW (ref 36.0–46.0)
Hemoglobin: 8.2 g/dL — ABNORMAL LOW (ref 12.0–15.0)
MCH: 21 pg — ABNORMAL LOW (ref 26.0–34.0)
MCHC: 28.4 g/dL — ABNORMAL LOW (ref 30.0–36.0)
MCV: 74.1 fL — ABNORMAL LOW (ref 80.0–100.0)
Platelets: 193 10*3/uL (ref 150–400)
RBC: 3.9 MIL/uL (ref 3.87–5.11)
RDW: 25.2 % — ABNORMAL HIGH (ref 11.5–15.5)
WBC: 4.3 10*3/uL (ref 4.0–10.5)
nRBC: 0 % (ref 0.0–0.2)

## 2022-01-04 LAB — MAGNESIUM: Magnesium: 2 mg/dL (ref 1.7–2.4)

## 2022-01-04 LAB — PROTIME-INR
INR: 1.7 — ABNORMAL HIGH (ref 0.8–1.2)
Prothrombin Time: 19.6 seconds — ABNORMAL HIGH (ref 11.4–15.2)

## 2022-01-04 LAB — HEPARIN LEVEL (UNFRACTIONATED): Heparin Unfractionated: 0.53 IU/mL (ref 0.30–0.70)

## 2022-01-04 LAB — VANCOMYCIN, TROUGH: Vancomycin Tr: 15 ug/mL (ref 15–20)

## 2022-01-04 MED ORDER — METRONIDAZOLE 500 MG PO TABS
500.0000 mg | ORAL_TABLET | Freq: Two times a day (BID) | ORAL | Status: AC
  Administered 2022-01-04 – 2022-01-08 (×9): 500 mg via ORAL
  Filled 2022-01-04 (×9): qty 1

## 2022-01-04 MED ORDER — HYDROMORPHONE HCL 1 MG/ML IJ SOLN
0.5000 mg | Freq: Once | INTRAMUSCULAR | Status: AC
  Administered 2022-01-04: 0.5 mg via INTRAVENOUS
  Filled 2022-01-04: qty 0.5

## 2022-01-04 MED ORDER — WARFARIN SODIUM 5 MG PO TABS
5.0000 mg | ORAL_TABLET | Freq: Once | ORAL | Status: AC
  Administered 2022-01-04: 5 mg via ORAL
  Filled 2022-01-04: qty 1

## 2022-01-04 MED ORDER — FUROSEMIDE 10 MG/ML IJ SOLN
40.0000 mg | Freq: Every day | INTRAMUSCULAR | Status: DC
  Administered 2022-01-05 – 2022-01-06 (×2): 40 mg via INTRAVENOUS
  Filled 2022-01-04 (×3): qty 4

## 2022-01-04 MED ORDER — FUROSEMIDE 10 MG/ML IJ SOLN
40.0000 mg | Freq: Once | INTRAMUSCULAR | Status: AC
  Administered 2022-01-04: 40 mg via INTRAVENOUS
  Filled 2022-01-04: qty 4

## 2022-01-04 NOTE — Progress Notes (Signed)
PROGRESS NOTE  [name] [name] OZD:[id number] DOB: July 22, 1975 DOA: 12/28/2021 PCP: Carrolyn Meiers, MD  Brief History:  46 y.o. female, known history of DVT PE 2015 IVC filter prior on Xarelto --- recurrence DVT/PE January 2023 status post thrombectomy on lifelong anticoagulation with Coumadin secondary to weight >250 pounds, OSA on CPAP, Known uterine fibroids, Smoker With recent admission to [facility] in sept 2023 due to syncope, felt to be vasovagal, combined CHF history (EF 30-35%).Patient presented to the ED secondary to complaints of worsening right breast pain and swelling, simply discharged on oral Augmentin without much improvement, she was admitted for further workup.  During the hospitalization, she was noted to be in decompensated HF.  She was started on IV lasix.  Cardiology was consulted.   Assessment/Plan:   Cellulitis of right breast with abscess/mastitis -With worsening right breast cellulitis despite being compliant with antibiotics, it is a concern for malignancy, especially with abnormal mammogram last year despite negative biopsy. -Continue with IV vancomycin given she failed oral Augmentin as an outpatient. -Pt going to [facility] 12/12 for IR to have US guided aspiration of right breast abscess.  Holding IV heparin for procedure.   -Medical suspicion for malignancy, so general surgery to perform punch biopsy during hospital stay, awaiting biopsy results  -ED discussed with Dr. Oneida Arenas, oncology, to be followed as an outpatient, as likely further imaging and liver biopsy needed in the outpatient setting.   - Breast US 12/31/21--US aspiration   - 12/9 biopsy>>acute on chronic inflammation -added cefepime for GNR pending final culture data -02/01/2020 right breast bx>>neg malignancy -discussed with Dr. Okey Dupre -outpt follow up with med/onc    Acute on Chronic combined systolic and diastolic CHF - Echocardiogram from 08/23/2021 showed EF dropped to 30 to  35% from 45-50 % back on Jun 05, 2021 -This echo also showed moderate to severe mitral regurg that was directed posteriorly, -Grade 3 diastolic dysfunction was noted (restrictive pattern) -This echo also showed severe dilatation of the left ventricular internal cavity with LVIDd..  With worsening pulmonary artery pressures (Compared to prior TTE on 05/2021, the EF has dropped to  30-35% from 45-50%, the LV is now severely enlarged, there is now at least  moderate MR (previously mild) and the filling pressures are significantly  elevated. ) -now clinically fluid overloaded -appreciate cardiology consult -restarted IV lasix -hypotension has limited GDMT>>lower dose losartan, metoprolol, spiro -12/15 ReDS= 55   DVT/PE (2015/2023) Chronic anticoagulation -INR was reversed after FFP given for biopsy procedure -sp thrombectomy (iliac) and IVC filter -holding warfarin for now in case further procedures needed -surgery said that heparin bridge is ok (temporarily holding 12/12 for procedure) -consulted pharmD to manage heparin bridge  -restart warfarin -cannot use DOAC due to weight   Anemia of iron deficiency -  Hemoglobin down to 7 -  transfused 2 units PRBC 12/11  -  transfuse slowly and provide IV lasix between units -  Iron studies confirm iron deficiency (likely from menorrhagia) -  recheck CBC 12/12 and Hg up to 8.6.    -give nulecit x 2    obesity/OSA/pulmonary hypertension - continue CPAP nightly    Morbid Obesity -BMI 45.88 -lifestyle modification   Left Thigh Pain -CT thigh r/o hematoma       Family Communication:   no Family at bedside   Consultants:  none   Code Status:  FULL    DVT Prophylaxis:  IV Heparin  Procedures: As Listed in Progress Note Above   Antibiotics: None         Subjective: Complains of lateral left thigh pain.  Denies f/c, cp, sob, n/v/d, abd pain.  Denies fall or injury  Objective: Vitals:   01/04/22 0519 01/04/22 1000  01/04/22 1100 01/04/22 1213  BP: 102/63  107/80 113/70  Pulse: 73  73 83  Resp: '20  18 20  '$ Temp: 98.7 F (37.1 C)  98.3 F (36.8 C) 97.7 F (36.5 C)  TempSrc: Oral  Oral Oral  SpO2: 95%  100% 100%  Weight:  (!) 158.9 kg    Height:        Intake/Output Summary (Last 24 hours) at 01/04/2022 1654 Last data filed at 01/04/2022 1300 Gross per 24 hour  Intake 2562.84 ml  Output --  Net 2562.84 ml   Weight change:  Exam:  General:  Pt is alert, follows commands appropriately, not in acute distress HEENT: No icterus, No thrush, No neck mass, [place]/AT Cardiovascular: RRR, S1/S2, no rubs, no gallops Respiratory: CTA bilaterally, no wheezing, no crackles, no rhonchi Abdomen: Soft/+BS, non tender, non distended, no guarding Extremities: 2 + LE edema, No lymphangitis, No petechiae, No rashes, no synovitis; left lateral thigh pain>>no warmth or erythema;  no open wound, no necrosis.  No crepitance   Data Reviewed: I have personally reviewed following labs and imaging studies Basic Metabolic Panel: Recent Labs  Lab 12/31/21 0429 01/01/22 0644 01/02/22 0448 01/03/22 0325 01/04/22 0359  NA 133* 136 135 133* 136  K 3.1* 4.1 3.8 3.7 3.7  CL 99 99 103 101 103  CO2 '26 29 27 25 26  '$ GLUCOSE 118* 94 92 93 87  BUN '20 18 18 17 15  '$ CREATININE 1.08* 1.10* 1.12* 1.07* 0.97  CALCIUM 7.9* 8.4* 8.5* 8.4* 8.7*  MG 1.8 2.1  --   --  2.0   Liver Function Tests: No results for input(s): "AST", "ALT", "ALKPHOS", "BILITOT", "PROT", "ALBUMIN" in the last 168 hours. No results for input(s): "LIPASE", "AMYLASE" in the last 168 hours. No results for input(s): "AMMONIA" in the last 168 hours. Coagulation Profile: Recent Labs  Lab 12/29/21 0602 12/29/21 1538 12/30/21 0512 01/03/22 0325 01/04/22 0359  INR 1.8* 1.6* 1.9* 1.4* 1.7*   CBC: Recent Labs  Lab 12/31/21 0429 01/01/22 0644 01/02/22 0448 01/03/22 0325 01/04/22 0359  WBC 5.8 5.6 4.7 3.8* 4.3  HGB 7.0* 8.6* 9.0* 8.5* 8.2*  HCT 25.7*  30.6* 32.5* 30.5* 28.9*  MCV 70.8* 73.4* 74.5* 74.9* 74.1*  PLT 236 249 245 241 193   Cardiac Enzymes: No results for input(s): "CKTOTAL", "CKMB", "CKMBINDEX", "TROPONINI" in the last 168 hours. BNP: Invalid input(s): "POCBNP" CBG: No results for input(s): "GLUCAP" in the last 168 hours. HbA1C: No results for input(s): "HGBA1C" in the last 72 hours. Urine analysis:    Component Value Date/Time   COLORURINE YELLOW 09/22/2021 1506   APPEARANCEUR HAZY (A) 09/22/2021 1506   APPEARANCEUR Cloudy (A) 07/21/2014 1326   LABSPEC 1.008 09/22/2021 1506   PHURINE 6.0 09/22/2021 1506   GLUCOSEU NEGATIVE 09/22/2021 1506   HGBUR NEGATIVE 09/22/2021 1506   BILIRUBINUR NEGATIVE 09/22/2021 1506   BILIRUBINUR Negative 07/21/2014 1326   KETONESUR NEGATIVE 09/22/2021 1506   PROTEINUR NEGATIVE 09/22/2021 1506   UROBILINOGEN 0.2 06/30/2014 0910   NITRITE NEGATIVE 09/22/2021 1506   LEUKOCYTESUR TRACE (A) 09/22/2021 1506   Sepsis Labs: '@LABRCNTIP'$ (procalcitonin:4,lacticidven:4) ) Recent Results (from the past 240 hour(s))  Aerobic/Anaerobic Culture w Gram Stain (surgical/deep wound)  Status: None (Preliminary result)   Collection Time: 01/01/22 12:22 PM   Specimen: Abscess  Result Value Ref Range Status   Specimen Description ABSCESS  Final   Special Requests  BREAST RIGHT  Final   Gram Stain   Final    ABUNDANT WBC PRESENT, PREDOMINANTLY PMN MODERATE GRAM POSITIVE COCCI FEW GRAM NEGATIVE RODS    Culture   Final    HOLDING FOR POSSIBLE ANAEROBE Performed at [facility], [place] [address], [place], [place] [address]    Report Status PENDING  Incomplete     Scheduled Meds:  atorvastatin  20 mg Oral Daily   [START ON 01/05/2022] furosemide  40 mg Intravenous Daily   losartan  12.5 mg Oral Daily   metoprolol succinate  25 mg Oral Daily   metroNIDAZOLE  500 mg Oral Q12H   midodrine  2.5 mg Oral BID WC   potassium chloride  10 mEq Oral q AM   spironolactone  12.5 mg Oral Daily    Warfarin - Pharmacist Dosing Inpatient   Does not apply q1600   Continuous Infusions:  ceFEPime (MAXIPIME) IV 2 g (01/04/22 1406)   heparin 2,700 Units/hr (01/04/22 1629)   vancomycin Stopped (01/04/22 1558)    Procedures/Studies: US BREAST ASPIRATION RIGHT  Result Date: 01/01/2022 INDICATION: RIGHT BREAST MASTITIS, UNDERLYING ABSCESS, RECENT BIOPSY EXAM: Ultrasound right breast needle aspiration MEDICATIONS: 1% lidocaine local ANESTHESIA/SEDATION: Moderate (conscious) sedation was employed during this procedure. A total of Versed 2.0 mg and Fentanyl 100 mcg was administered intravenously by the radiology nurse. Total intra-service moderate Sedation Time: 17 minutes. The patient's level of consciousness and vital signs were monitored continuously by radiology nursing throughout the procedure under my direct supervision. COMPLICATIONS: None immediate. PROCEDURE: Informed written consent was obtained from the patient after a thorough discussion of the procedural risks, benefits and alternatives. All questions were addressed. Maximal Sterile Barrier Technique was utilized including caps, mask, sterile gowns, sterile gloves, sterile drape, hand hygiene and skin antiseptic. A timeout was performed prior to the initiation of the procedure. Previous imaging reviewed. Preliminary on performed. Complex retroareolar hypoechoic fluid pocket was localized at the 6 o'clock position. Overlying skin marked. Under sterile conditions and local anesthesia, the Yueh sheath needle was advanced into the hypoechoic fluid collection with direct ultrasound guidance. Needle position confirmed with ultrasound. Images obtained for documentation. 8 cc purulent fluid aspirated. Sample sent for culture. Survey of the breast demonstrates diffuse edema. No additional large fluid pocket localized by ultrasound for additional aspiration or drainage. IMPRESSION: Ultrasound-guided right breast abscess needle aspiration. Electronically  Signed   By: Jerilynn Mages.  Shick M.D.   On: 01/01/2022 15:59   US BREAST LTD UNI RIGHT INC AXILLA  Result Date: 12/31/2021 CLINICAL DATA:  RIGHT breast mass. Patient had ultrasound-guided core biopsy of mass in the retroareolar RIGHT breast performed 02/01/2020, showing benign concordant acute and chronic inflammation. Surgical consultation was recommended for discordant findings. More recently, patient presented to the ED secondary to complaints of worsening right breast pain and swelling. She was admitted for further workup. She underwent a punch biopsy of the right breast on 12/29/2021; results are pending. Per discussion with the technologist, patient has active drainage of purulent fluid from the MEDIAL aspect of the RIGHT breast, discrete from the biopsy site. EXAM: ULTRASOUND OF THE RIGHT BREAST COMPARISON:  02/01/2020 FINDINGS: Targeted ultrasound is performed, showing marked skin thickening in the circumareolar and retroareolar regions of the RIGHT breast. A discrete hypoechoic collection is contiguous with the thickened  skin in the immediate retroareolar region. This collection is 1.8 x 2.7 x 2.0 centimeters. Considerations include inflammatory changes or abscess. IMPRESSION: Suspect retroareolar abscess measuring 2.7 centimeters. RECOMMENDATION: Consider ultrasound-guided aspiration of right breast. Recommend annual screening mammography. Last bilateral mammogram was performed 01/20/2020. BI-RADS CATEGORY  2: Benign. These results will be called to the ordering clinician or representative by the Radiologist Assistant, and communication documented in the PACS or [employer]. Electronically Signed   By: Nolon Nations M.D.   On: 12/31/2021 12:23  CT Chest W Contrast  Result Date: 12/16/2021 CLINICAL DATA:  Chest wall mass EXAM: CT CHEST WITH CONTRAST TECHNIQUE: Multidetector CT imaging of the chest was performed during intravenous contrast administration. RADIATION DOSE REDUCTION: This exam was  performed according to the departmental dose-optimization program which includes automated exposure control, adjustment of the mA and/or kV according to patient size and/or use of iterative reconstruction technique. CONTRAST:  59m OMNIPAQUE IOHEXOL 300 MG/ML  SOLN COMPARISON:  Mammogram 01/20/2020, breast ultrasound 02/01/2020, 01/20/2020, chest CT 06/05/2021. Chest x-ray 12/16/2021 FINDINGS: Cardiovascular: Nonaneurysmal aorta. Borderline cardiomegaly. No pericardial effusion. Mediastinum/Nodes: Midline trachea. No thyroid mass. Esophagus within normal limits. Multiple enlarged right axillary lymph nodes measuring up to 2.3 cm. Lungs/Pleura: No acute airspace disease, pleural effusion, or pneumothorax. Mild subpleural reticulation within the right anterior upper lobe. Upper Abdomen: Small volume ascites adjacent to the liver and spleen. Generalized mesenteric stranding. Partially visualized IVC filter. 2 cm hyperenhancing area in the central liver, series 2, image 154. This appears present on 06/05/2021 chest CT with suggestion of peripheral enhancement as may be seen with hemangioma. Musculoskeletal: No acute osseous abnormality. Marked diffuse skin thickening and generalized edema and stranding throughout the entirety of the right breast. No discrete rim enhancing collection by CT. The nipple areola complex is poorly defined but suspect that the nipple is retracted. There is generalized edema throughout the subcutaneous soft tissues consistent with anasarca. IMPRESSION: 1. Marked diffuse skin thickening involving the right breast with generalized edema and soft tissue stranding. Poorly defined nipple areola complex but suspect that the nipple is retracted, correlate with direct inspection. Primary differential considerations include inflammatory breast cancer versus mastitis. Surgical consultation is recommended. There are multiple enlarged right axillary lymph nodes. 2. There is considerable generalized  subcutaneous edema diffusely consistent with anasarca. There is small volume ascites within the upper abdomen. 3. Hyperenhancing lesion within the central liver, potential hemangioma. When the patient is clinically stable and able to follow directions and hold their breath (preferably as an outpatient) further evaluation with dedicated abdominal MRI should be considered. Electronically Signed   By: KDonavan FoilM.D.   On: 12/16/2021 16:13   DG Chest 2 View  Result Date: 12/16/2021 CLINICAL DATA:  cough EXAM: CHEST - 2 VIEW COMPARISON:  09/14/2018 FINDINGS: Cardiac silhouette enlarged. No evidence of pneumothorax or pleural effusion. No evidence of pulmonary edema or pneumonia. There are thoracic degenerative changes IMPRESSION: Enlarged cardiac silhouette.  No focal consolidation Electronically Signed   By: JSammie BenchM.D.   On: 12/16/2021 12:01    DOrson Eva DO  [facility]  If 7PM-7AM, please contact night-coverage [email or web address] Password [email or web address] 01/04/2022, 4:54 PM   LOS: 7 days

## 2022-01-04 NOTE — Progress Notes (Signed)
Rounding Note    Patient Name: [name] [name] Date of Encounter: 01/04/2022  [facility] Cardiologist: Carlyle Dolly, MD   Subjective   Some SOB  Inpatient Medications    Scheduled Meds:  atorvastatin  20 mg Oral Daily   losartan  12.5 mg Oral Daily   metoprolol succinate  25 mg Oral Daily   midodrine  2.5 mg Oral BID WC   potassium chloride  10 mEq Oral q AM   spironolactone  12.5 mg Oral Daily   warfarin  5 mg Oral ONCE-1600   Warfarin - Pharmacist Dosing Inpatient   Does not apply q1600   Continuous Infusions:  ceFEPime (MAXIPIME) IV 2 g (01/04/22 0610)   heparin 2,700 Units/hr (01/04/22 8295)   vancomycin 1,250 mg (01/03/22 2212)   PRN Meds: acetaminophen **OR** acetaminophen, albuterol, oxyCODONE   Vital Signs    Vitals:   01/03/22 0517 01/03/22 1202 01/03/22 2223 01/04/22 0519  BP: (!) 99/58 109/71 (!) 104/58 102/63  Pulse: 71 74 87 73  Resp: '18 20 18 20  '$ Temp: 97.8 F (36.6 C) 97.9 F (36.6 C) 99.8 F (37.7 C) 98.7 F (37.1 C)  TempSrc: Oral Oral Oral Oral  SpO2: 98% 100% 98% 95%  Weight:      Height:        Intake/Output Summary (Last 24 hours) at 01/04/2022 0904 Last data filed at 01/04/2022 0610 Gross per 24 hour  Intake 2082.84 ml  Output --  Net 2082.84 ml      12/28/2021    9:37 AM 12/20/2021    9:03 AM 12/16/2021   11:44 AM  Last 3 Weights  Weight (lbs) 328 lb 14.8 oz 329 lb 273 lb  Weight (kg) 149.2 kg 149.233 kg 123.832 kg      Telemetry    N/a - Personally Reviewed  ECG    N/a - Personally Reviewed  Physical Exam   GEN: No acute distress.   Neck: + JVD Cardiac: RRR, no murmurs, rubs, or gallops.  Respiratory: Clear to auscultation bilaterally. GI: Soft, nontender, non-distended  MS: 1+ bilateral LE edema Neuro:  Nonfocal  Psych: Normal affect   Labs    High Sensitivity Troponin:  No results for input(s): "TROPONINIHS" in the last 720 hours.   Chemistry Recent Labs  Lab 12/31/21 0429  01/01/22 0644 01/02/22 0448 01/03/22 0325 01/04/22 0359  NA 133* 136 135 133* 136  K 3.1* 4.1 3.8 3.7 3.7  CL 99 99 103 101 103  CO2 '26 29 27 25 26  '$ GLUCOSE 118* 94 92 93 87  BUN '20 18 18 17 15  '$ CREATININE 1.08* 1.10* 1.12* 1.07* 0.97  CALCIUM 7.9* 8.4* 8.5* 8.4* 8.7*  MG 1.8 2.1  --   --  2.0  GFRNONAA >60 >60 >60 >60 >60  ANIONGAP '8 8 5 7 7    '$ Lipids No results for input(s): "CHOL", "TRIG", "HDL", "LABVLDL", "LDLCALC", "CHOLHDL" in the last 168 hours.  Hematology Recent Labs  Lab 01/02/22 0448 01/03/22 0325 01/04/22 0359  WBC 4.7 3.8* 4.3  RBC 4.36 4.07 3.90  HGB 9.0* 8.5* 8.2*  HCT 32.5* 30.5* 28.9*  MCV 74.5* 74.9* 74.1*  MCH 20.6* 20.9* 21.0*  MCHC 27.7* 27.9* 28.4*  RDW 23.6* 24.4* 25.2*  PLT 245 241 193   Thyroid No results for input(s): "TSH", "FREET4" in the last 168 hours.  BNP Recent Labs  Lab 01/03/22 0325  BNP 1,232.0*    DDimer No results for input(s): "DDIMER" in the  last 168 hours.   Radiology    No results found.  Cardiac Studies     Patient Profile     [name] [name] is a 46 y.o. female with a hx of HFrEF (EF 45-50% in 05/2021 with cath showing 40-50% proximal RCA stenosis but otherwise normal cors, EF at 30-35% in 08/2021 and 09/2021), moderate to severe MR, history of recurrent PE/DVT, moderate to severe MR, HTN, NSVT and OSA (on CPAP) who is being seen 01/03/2022 for the evaluation of an acute CHF exacerbation at the request of Dr. Carles Collet.   Assessment & Plan    1.Acute on chronic HFrEF - 09/2021 echo LVEF 09-32%, indet diastolic, mild RV dysfunction, mod to severe MR that is functional - cMRI pending as outpatient - titration of medical therapy has been limited given her baseline hypotension  - BNP 1232, no recent CXR.   I/Os are incomplete, received lasix '40mg'$  x 1 yesterday along with '80mg'$  oral of lasix. Downtrend in Cr with diuresis consitent with venous congestion and CHF. CHeck reds vest today. Remains fluiv overloaded by exam,  redose IV lasix '40mg'$  x 1.  - medical therapy limited by low bp's, currnetly on midodrine 2.'5mg'$  bid. [name] [name] is on losartran 12.5, toprol 25,aldactone 12.'5mg'$ . No SGLT2i with active infection. Looks like toprol and losartan held yesterday due to bp, follow bp's today  Would anticipate likely needing several more days of IV diuresis.  2. Cellulitis right breast/abscess - per primary team  3. History of DVT/PE - on coumadin  4.Anemia - per primary team, has required transfusion this admission  For questions or updates, please contact [place] Please consult [email or web address] for contact info under        Signed, Carlyle Dolly, MD  01/04/2022, 9:04 AM

## 2022-01-04 NOTE — Progress Notes (Signed)
ANTICOAGULATION CONSULT NOTE -   Pharmacy Consult for heparin Indication: pulmonary embolus  Allergies  Allergen Reactions   Sulfa Antibiotics Itching, Swelling and Rash    Lip swelling    Patient Measurements: Height: '5\' 11"'$  (180.3 cm) Weight: (!) 149.2 kg (328 lb 14.8 oz) IBW/kg (Calculated) : 70.8 Heparin Dosing Weight: 107kg  Vital Signs: Temp: 98.7 F (37.1 C) (12/15 0519) Temp Source: Oral (12/15 0519) BP: 102/63 (12/15 0519) Pulse Rate: 73 (12/15 0519)  Labs: Recent Labs    01/02/22 0448 01/03/22 0325 01/04/22 0359  HGB 9.0* 8.5* 8.2*  HCT 32.5* 30.5* 28.9*  PLT 245 241 193  LABPROT  --  17.1* 19.6*  INR  --  1.4* 1.7*  HEPARINUNFRC 0.59 0.58 0.53  CREATININE 1.12* 1.07* 0.97     Estimated Creatinine Clearance: 118.2 mL/min (by C-G formula based on SCr of 0.97 mg/dL).   Medical History: Past Medical History:  Diagnosis Date   Abdominal cramps 08/30/2015   Anemia    Blood transfusion without reported diagnosis    CAD (coronary artery disease)    Chronic combined systolic and diastolic CHF (congestive heart failure) (HCC)    Closed left ankle fracture    August 30 2012   Clotting disorder [facility])    DVT (deep venous thrombosis) (HCC)    L leg   Fibroid tumor    Hidradenitis    High blood pressure    Hypertension    Mitral regurgitation    NICM (nonischemic cardiomyopathy) (HCC)    NSVT (nonsustained ventricular tachycardia) (HCC)    Obesity    OSA on CPAP    Oxygen deficiency    Pregnancy    09/20/14 17 weeks   Pulmonary embolism ([place]) 04/2013   Sleep apnea    Tobacco use disorder     Medications:  Medications Prior to Admission  Medication Sig Dispense Refill Last Dose   albuterol (VENTOLIN HFA) 108 (90 Base) MCG/ACT inhaler Inhale 2 puffs into the lungs every 2 (two) hours as needed for wheezing or shortness of breath. 18 g 11 12/28/2021   atorvastatin (LIPITOR) 20 MG tablet Take 1 tablet (20 mg total) by mouth daily. (Patient taking  differently: Take 20 mg by mouth in the morning.) 90 tablet 3 12/28/2021   furosemide (LASIX) 80 MG tablet Take 1 tablet (80 mg total) by mouth daily. (Patient taking differently: Take 80 mg by mouth in the morning.) 30 tablet 3 12/27/2021   losartan (COZAAR) 25 MG tablet Take 1 tablet (25 mg total) by mouth daily. (Patient taking differently: Take 25 mg by mouth in the morning.) 90 tablet 3 12/28/2021   Magnesium Oxide 400 MG CAPS Take 1 capsule (400 mg total) by mouth daily. (Patient taking differently: Take 400 mg by mouth in the morning.) 90 capsule 3 12/28/2021   metoprolol succinate (TOPROL-XL) 50 MG 24 hr tablet Take 1 tablet (50 mg total) by mouth daily. Take with or immediately following a meal. (Patient taking differently: Take 50 mg by mouth in the morning.) 90 tablet 3 12/28/2021 at 0730   potassium chloride (KLOR-CON) 10 MEQ tablet Take 1 tablet (10 mEq total) by mouth daily. (Patient taking differently: Take 10 mEq by mouth in the morning.) 90 tablet 3 12/28/2021   spironolactone (ALDACTONE) 25 MG tablet Take 0.5 tablets (12.5 mg total) by mouth daily. (Patient taking differently: Take 12.5 mg by mouth in the morning.) 30 tablet 3 12/28/2021   warfarin (COUMADIN) 10 MG tablet Take 5-10 mg by mouth See  admin instructions. Take as directed in the evening : Monday take 10 mg and all other days take 5 mg.   12/28/2021   warfarin (COUMADIN) 5 MG tablet TAKE (1) OR (1) 1/2 TABLET BY MOUTH DAILY AS DIRECTED.   12/28/2021   amoxicillin-clavulanate (AUGMENTIN) 875-125 MG tablet Take 1 tablet by mouth every 12 (twelve) hours. (Patient not taking: Reported on 12/28/2021) 14 tablet 0 Completed Course   HYDROcodone-acetaminophen (NORCO/VICODIN) 5-325 MG tablet Take one tab po q 4 hrs prn pain (Patient not taking: Reported on 12/28/2021) 8 tablet 0 Completed Course   Scheduled:   atorvastatin  20 mg Oral Daily   losartan  12.5 mg Oral Daily   metoprolol succinate  25 mg Oral Daily   midodrine  2.5 mg Oral BID  WC   potassium chloride  10 mEq Oral q AM   spironolactone  12.5 mg Oral Daily   Warfarin - Pharmacist Dosing Inpatient   Does not apply q1600   Infusions:   ceFEPime (MAXIPIME) IV 2 g (01/04/22 0610)   heparin 2,700 Units/hr (01/04/22 1941)   vancomycin 1,250 mg (01/03/22 2212)    Assessment: Coumadin has been on hold for a hx of PE. Plan to bridge with heparin until surgical intervention is done. right breast aspiration 12/12 at [facility].  Restarting warfarin 12/13.  Home dose listed as 10 mg every Monday and 5 mg ROW.  HL 0.53, therapeutic INR 1.4>1.7, subtherapeutic. Hgb 8.2  Goal of Therapy:  Heparin level 0.3-0.7 units/ml Monitor platelets by anticoagulation protocol: Yes   Plan:  Continue Heparin  at 2700 units/hr  Daily heparin level Warfarin 5 mg x 1 dose  Daily PT/INR Monitor for S/s of bleeding  Margot Ables, PharmD Clinical Pharmacist 01/04/2022 8:04 AM

## 2022-01-04 NOTE — Progress Notes (Signed)
   01/04/22 1000  ReDS Vest / Clip  BMI (Calculated) 48.88  Station Marker D  Ruler Value 32  ReDS Value Range (!) > 40  ReDS Actual Value 55

## 2022-01-05 DIAGNOSIS — N61 Mastitis without abscess: Secondary | ICD-10-CM | POA: Diagnosis not present

## 2022-01-05 DIAGNOSIS — Z7901 Long term (current) use of anticoagulants: Secondary | ICD-10-CM | POA: Diagnosis not present

## 2022-01-05 DIAGNOSIS — I5043 Acute on chronic combined systolic (congestive) and diastolic (congestive) heart failure: Secondary | ICD-10-CM | POA: Diagnosis not present

## 2022-01-05 LAB — AEROBIC/ANAEROBIC CULTURE W GRAM STAIN (SURGICAL/DEEP WOUND)

## 2022-01-05 LAB — HEPARIN LEVEL (UNFRACTIONATED): Heparin Unfractionated: 0.63 IU/mL (ref 0.30–0.70)

## 2022-01-05 LAB — VANCOMYCIN, TROUGH: Vancomycin Tr: 17 ug/mL (ref 15–20)

## 2022-01-05 LAB — PROTIME-INR
INR: 1.6 — ABNORMAL HIGH (ref 0.8–1.2)
Prothrombin Time: 19.3 seconds — ABNORMAL HIGH (ref 11.4–15.2)

## 2022-01-05 MED ORDER — WARFARIN SODIUM 7.5 MG PO TABS
7.5000 mg | ORAL_TABLET | Freq: Once | ORAL | Status: AC
  Administered 2022-01-05: 7.5 mg via ORAL
  Filled 2022-01-05: qty 1

## 2022-01-05 NOTE — Progress Notes (Signed)
ANTICOAGULATION CONSULT NOTE -   Pharmacy Consult for heparin Indication: pulmonary embolus  Allergies  Allergen Reactions   Sulfa Antibiotics Itching, Swelling and Rash    Lip swelling    Patient Measurements: Height: '5\' 11"'$  (180.3 cm) Weight: (!) 158.9 kg (350 lb 5 oz) IBW/kg (Calculated) : 70.8 Heparin Dosing Weight: 107kg  Vital Signs: Temp: 98.2 F (36.8 C) (12/16 0339) Temp Source: Oral (12/16 0339) BP: 126/80 (12/16 0339) Pulse Rate: 83 (12/16 0339)  Labs: Recent Labs    01/03/22 0325 01/04/22 0359 01/05/22 0625  HGB 8.5* 8.2*  --   HCT 30.5* 28.9*  --   PLT 241 193  --   LABPROT 17.1* 19.6* 19.3*  INR 1.4* 1.7* 1.6*  HEPARINUNFRC 0.58 0.53 0.63  CREATININE 1.07* 0.97  --      Estimated Creatinine Clearance: 122.6 mL/min (by C-G formula based on SCr of 0.97 mg/dL).   Medical History: Past Medical History:  Diagnosis Date   Abdominal cramps 08/30/2015   Anemia    Blood transfusion without reported diagnosis    CAD (coronary artery disease)    Chronic combined systolic and diastolic CHF (congestive heart failure) (HCC)    Closed left ankle fracture    August 30 2012   Clotting disorder [facility])    DVT (deep venous thrombosis) (HCC)    L leg   Fibroid tumor    Hidradenitis    High blood pressure    Hypertension    Mitral regurgitation    NICM (nonischemic cardiomyopathy) (HCC)    NSVT (nonsustained ventricular tachycardia) (HCC)    Obesity    OSA on CPAP    Oxygen deficiency    Pregnancy    09/20/14 17 weeks   Pulmonary embolism ([place]) 04/2013   Sleep apnea    Tobacco use disorder     Medications:  Medications Prior to Admission  Medication Sig Dispense Refill Last Dose   albuterol (VENTOLIN HFA) 108 (90 Base) MCG/ACT inhaler Inhale 2 puffs into the lungs every 2 (two) hours as needed for wheezing or shortness of breath. 18 g 11 12/28/2021   atorvastatin (LIPITOR) 20 MG tablet Take 1 tablet (20 mg total) by mouth daily. (Patient taking  differently: Take 20 mg by mouth in the morning.) 90 tablet 3 12/28/2021   furosemide (LASIX) 80 MG tablet Take 1 tablet (80 mg total) by mouth daily. (Patient taking differently: Take 80 mg by mouth in the morning.) 30 tablet 3 12/27/2021   losartan (COZAAR) 25 MG tablet Take 1 tablet (25 mg total) by mouth daily. (Patient taking differently: Take 25 mg by mouth in the morning.) 90 tablet 3 12/28/2021   Magnesium Oxide 400 MG CAPS Take 1 capsule (400 mg total) by mouth daily. (Patient taking differently: Take 400 mg by mouth in the morning.) 90 capsule 3 12/28/2021   metoprolol succinate (TOPROL-XL) 50 MG 24 hr tablet Take 1 tablet (50 mg total) by mouth daily. Take with or immediately following a meal. (Patient taking differently: Take 50 mg by mouth in the morning.) 90 tablet 3 12/28/2021 at 0730   potassium chloride (KLOR-CON) 10 MEQ tablet Take 1 tablet (10 mEq total) by mouth daily. (Patient taking differently: Take 10 mEq by mouth in the morning.) 90 tablet 3 12/28/2021   spironolactone (ALDACTONE) 25 MG tablet Take 0.5 tablets (12.5 mg total) by mouth daily. (Patient taking differently: Take 12.5 mg by mouth in the morning.) 30 tablet 3 12/28/2021   warfarin (COUMADIN) 10 MG tablet Take 5-10  mg by mouth See admin instructions. Take as directed in the evening : Monday take 10 mg and all other days take 5 mg.   12/28/2021   warfarin (COUMADIN) 5 MG tablet TAKE (1) OR (1) 1/2 TABLET BY MOUTH DAILY AS DIRECTED.   12/28/2021   amoxicillin-clavulanate (AUGMENTIN) 875-125 MG tablet Take 1 tablet by mouth every 12 (twelve) hours. (Patient not taking: Reported on 12/28/2021) 14 tablet 0 Completed Course   HYDROcodone-acetaminophen (NORCO/VICODIN) 5-325 MG tablet Take one tab po q 4 hrs prn pain (Patient not taking: Reported on 12/28/2021) 8 tablet 0 Completed Course   Scheduled:   atorvastatin  20 mg Oral Daily   furosemide  40 mg Intravenous Daily   losartan  12.5 mg Oral Daily   metoprolol succinate  25 mg  Oral Daily   metroNIDAZOLE  500 mg Oral Q12H   midodrine  2.5 mg Oral BID WC   potassium chloride  10 mEq Oral q AM   spironolactone  12.5 mg Oral Daily   Warfarin - Pharmacist Dosing Inpatient   Does not apply q1600   Infusions:   ceFEPime (MAXIPIME) IV 2 g (01/05/22 0612)   heparin 2,700 Units/hr (01/05/22 0301)   vancomycin 166.7 mL/hr at 01/04/22 2200    Assessment: Coumadin has been on hold for a hx of PE. Plan to bridge with heparin until surgical intervention is done. right breast aspiration 12/12 at [facility] IR.  Restarting warfarin 12/13.  Home dose listed as 10 mg every Monday and 5 mg ROW.  HL 0.63, therapeutic INR 1.4>1.7>1.6, subtherapeutic. Hgb 8.2  Goal of Therapy:  Heparin level 0.3-0.7 units/ml Monitor platelets by anticoagulation protocol: Yes   Plan:  Continue Heparin  at 2700 units/hr  Daily heparin level Warfarin 7.5 mg x 1 dose  Daily PT/INR Monitor for S/s of bleeding  Margot Ables, PharmD Clinical Pharmacist 01/05/2022 8:43 AM

## 2022-01-05 NOTE — Progress Notes (Signed)
Pharmacy Antibiotic Note  [name] [name] is a 46 y.o. female admitted on 12/28/2021 with cellulitis.  Pharmacy has been consulted for Vancomycin dosing. VT 35mg/ml, therapeutic   Plan: Continue Vancomycin '1250mg'$  IV Q 12 hrs. Goal AUC 400-550. Continue cefepime 2000 mg IV every 8 hours. F/U cxs and clinical progress Monitor V/S, labs and levels as indicated  Height: '5\' 11"'$  (180.3 cm) Weight: (!) 158.9 kg (350 lb 5 oz) IBW/kg (Calculated) : 70.8  Temp (24hrs), Avg:98.1 F (36.7 C), Min:97.7 F (36.5 C), Max:98.3 F (36.8 C)  Recent Labs  Lab 12/31/21 0429 12/31/21 0811 01/01/22 0644 01/02/22 0448 01/03/22 0325 01/04/22 0359 01/04/22 1633 01/05/22 0625  WBC 5.8  --  5.6 4.7 3.8* 4.3  --   --   CREATININE 1.08*  --  1.10* 1.12* 1.07* 0.97  --   --   VANCOTROUGH  --    < >  --   --   --   --  15 17   < > = values in this interval not displayed.     Estimated Creatinine Clearance: 122.6 mL/min (by C-G formula based on SCr of 0.97 mg/dL).    Allergies  Allergen Reactions   Sulfa Antibiotics Itching, Swelling and Rash    Lip swelling    Antimicrobials this admission: Vancomycin  12/8 >>  Cefepime 12/13 >> Flagyl 12/15 >>  Microbiology results: 12/14 Right breast Abscess cx: few GPC, few GNR Possible anaerobe   Thank you for allowing pharmacy to be a part of this patient's care.  SMargot Ables PharmD Clinical Pharmacist 01/05/2022 8:42 AM

## 2022-01-05 NOTE — Progress Notes (Signed)
PROGRESS NOTE  GUILA OWENSBY CXK:[id number] DOB: 01/15/1976 DOA: 12/28/2021 PCP: Carrolyn Meiers, MD  Brief History:  46 y.o. female, known history of DVT PE 2015 IVC filter prior on Xarelto --- recurrence DVT/PE January 2023 status post thrombectomy on lifelong anticoagulation with Coumadin secondary to weight >250 pounds, OSA on CPAP, Known uterine fibroids, Smoker With recent admission to [facility] in sept 2023 due to syncope, felt to be vasovagal, combined CHF history (EF 30-35%).Patient presented to the ED secondary to complaints of worsening right breast pain and swelling, simply discharged on oral Augmentin without much improvement, she was admitted for further workup.  During the hospitalization, she was noted to be in decompensated HF.  She was started on IV lasix.  Cardiology was consulted.   Assessment/Plan:   Cellulitis of right breast with abscess/mastitis -With worsening right breast cellulitis despite being compliant with antibiotics, it is a concern for malignancy, especially with abnormal mammogram last year despite negative biopsy. -Continue with IV vancomycin given she failed oral Augmentin as an outpatient. -Pt going to [facility] 12/12 for IR to have US guided aspiration of right breast abscess.  Holding IV heparin for procedure.   -Medical suspicion for malignancy, so general surgery to perform punch biopsy during hospital stay, awaiting biopsy results  -ED discussed with Dr. Oneida Arenas, oncology, to be followed as an outpatient, as likely further imaging and liver biopsy needed in the outpatient setting.   - Breast US 12/31/21--US aspiration   - 12/9 biopsy>>acute on chronic inflammation -added cefepime for GNR>>Final culture = mixed anaerobes -02/01/2020 right breast bx>>neg malignancy -discussed with Dr. Okey Dupre -outpt follow up with med/onc    Acute on Chronic combined systolic and diastolic CHF - Echocardiogram from 08/23/2021 showed EF dropped to  30 to 35% from 45-50 % back on Jun 05, 2021 -This echo also showed moderate to severe mitral regurg that was directed posteriorly, -Grade 3 diastolic dysfunction was noted (restrictive pattern) -This echo also showed severe dilatation of the left ventricular internal cavity with LVIDd..  With worsening pulmonary artery pressures (Compared to prior TTE on 05/2021, the EF has dropped to  30-35% from 45-50%, the LV is now severely enlarged, there is now at least  moderate MR (previously mild) and the filling pressures are significantly  elevated. ) -remains clinically fluid overloaded -appreciate cardiology consult -restarted IV lasix -I/O incomplete -hypotension has limited GDMT>>lower dose losartan, metoprolol, spiro -12/15 ReDS= 55   DVT/PE (2015/2023) Chronic anticoagulation -INR was reversed after FFP given for biopsy procedure -sp thrombectomy (iliac) and IVC filter -holding warfarin for now in case further procedures needed -surgery said that heparin bridge is ok (temporarily holding 12/12 for procedure) -consulted pharmD to manage heparin bridge  -restarted warfarin -cannot use DOAC due to weight   Anemia of iron deficiency -  Hemoglobin down to 7 -  transfused 2 units PRBC 12/11  -  transfuse slowly and provide IV lasix between units -  Iron studies confirm iron deficiency (likely from menorrhagia) -  Hgb remains largely stable since transfusion -give nulecit x 2    obesity/OSA/pulmonary hypertension - continue CPAP nightly    Morbid Obesity -BMI 45.88 -lifestyle modification   Left Thigh Pain -CT thigh--neg for fluid collection or hematoma -now improved       Family Communication:   no Family at bedside   Consultants:  none   Code Status:  FULL    DVT Prophylaxis:  IV  Heparin      Procedures: As Listed in Progress Note Above   Antibiotics: None      Subjective: Has occasional leg pain bilateral with weight bearing.  Denies f/c, cp, sob, n/v/d.   Right breast is improving  Objective: Vitals:   01/04/22 1213 01/04/22 2202 01/05/22 0339 01/05/22 1509  BP: 113/70 114/71 126/80 109/71  Pulse: 83 75 83 72  Resp: '20 18 18 18  '$ Temp: 97.7 F (36.5 C) 98.2 F (36.8 C) 98.2 F (36.8 C) 98.8 F (37.1 C)  TempSrc: Oral Oral Oral   SpO2: 100% 100% 100% 99%  Weight:      Height:        Intake/Output Summary (Last 24 hours) at 01/05/2022 1631 Last data filed at 01/05/2022 0746 Gross per 24 hour  Intake 480 ml  Output --  Net 480 ml   Weight change:  Exam:  General:  Pt is alert, follows commands appropriately, not in acute distress HEENT: No icterus, No thrush, No neck mass, [place]/AT Cardiovascular: RRR, S1/S2, no rubs, no gallops Respiratory: bibasilar crackles.  No wheeze.   Abdomen: Soft/+BS, non tender, non distended, no guarding Extremities: 2+ LE edema, No lymphangitis, No petechiae, No rashes, no synovitis   Data Reviewed: I have personally reviewed following labs and imaging studies Basic Metabolic Panel: Recent Labs  Lab 12/31/21 0429 01/01/22 0644 01/02/22 0448 01/03/22 0325 01/04/22 0359  NA 133* 136 135 133* 136  K 3.1* 4.1 3.8 3.7 3.7  CL 99 99 103 101 103  CO2 '26 29 27 25 26  '$ GLUCOSE 118* 94 92 93 87  BUN '20 18 18 17 15  '$ CREATININE 1.08* 1.10* 1.12* 1.07* 0.97  CALCIUM 7.9* 8.4* 8.5* 8.4* 8.7*  MG 1.8 2.1  --   --  2.0   Liver Function Tests: No results for input(s): "AST", "ALT", "ALKPHOS", "BILITOT", "PROT", "ALBUMIN" in the last 168 hours. No results for input(s): "LIPASE", "AMYLASE" in the last 168 hours. No results for input(s): "AMMONIA" in the last 168 hours. Coagulation Profile: Recent Labs  Lab 12/30/21 0512 01/03/22 0325 01/04/22 0359 01/05/22 0625  INR 1.9* 1.4* 1.7* 1.6*   CBC: Recent Labs  Lab 12/31/21 0429 01/01/22 0644 01/02/22 0448 01/03/22 0325 01/04/22 0359  WBC 5.8 5.6 4.7 3.8* 4.3  HGB 7.0* 8.6* 9.0* 8.5* 8.2*  HCT 25.7* 30.6* 32.5* 30.5* 28.9*  MCV 70.8* 73.4*  74.5* 74.9* 74.1*  PLT 236 249 245 241 193   Cardiac Enzymes: No results for input(s): "CKTOTAL", "CKMB", "CKMBINDEX", "TROPONINI" in the last 168 hours. BNP: Invalid input(s): "POCBNP" CBG: No results for input(s): "GLUCAP" in the last 168 hours. HbA1C: No results for input(s): "HGBA1C" in the last 72 hours. Urine analysis:    Component Value Date/Time   COLORURINE YELLOW 09/22/2021 1506   APPEARANCEUR HAZY (A) 09/22/2021 1506   APPEARANCEUR Cloudy (A) 07/21/2014 1326   LABSPEC 1.008 09/22/2021 1506   PHURINE 6.0 09/22/2021 1506   GLUCOSEU NEGATIVE 09/22/2021 1506   HGBUR NEGATIVE 09/22/2021 1506   BILIRUBINUR NEGATIVE 09/22/2021 1506   BILIRUBINUR Negative 07/21/2014 1326   KETONESUR NEGATIVE 09/22/2021 1506   PROTEINUR NEGATIVE 09/22/2021 1506   UROBILINOGEN 0.2 06/30/2014 0910   NITRITE NEGATIVE 09/22/2021 1506   LEUKOCYTESUR TRACE (A) 09/22/2021 1506   Sepsis Labs: '@LABRCNTIP'$ (procalcitonin:4,lacticidven:4) ) Recent Results (from the past 240 hour(s))  Aerobic/Anaerobic Culture w Gram Stain (surgical/deep wound)     Status: None   Collection Time: 01/01/22 12:22 PM   Specimen: Abscess  Result  Value Ref Range Status   Specimen Description ABSCESS  Final   Special Requests  BREAST RIGHT  Final   Gram Stain   Final    ABUNDANT WBC PRESENT, PREDOMINANTLY PMN MODERATE GRAM POSITIVE COCCI FEW GRAM NEGATIVE RODS Performed at [facility], [place] [address], [place], [place] [address]    Culture   Final    MIXED ANAEROBIC FLORA PRESENT.  CALL LAB IF FURTHER IID REQUIRED.   Report Status 01/05/2022 FINAL  Final     Scheduled Meds:  atorvastatin  20 mg Oral Daily   furosemide  40 mg Intravenous Daily   losartan  12.5 mg Oral Daily   metoprolol succinate  25 mg Oral Daily   metroNIDAZOLE  500 mg Oral Q12H   midodrine  2.5 mg Oral BID WC   potassium chloride  10 mEq Oral q AM   spironolactone  12.5 mg Oral Daily   warfarin  7.5 mg Oral ONCE-1600   Warfarin -  Pharmacist Dosing Inpatient   Does not apply q1600   Continuous Infusions:  ceFEPime (MAXIPIME) IV 2 g (01/05/22 1248)   heparin 2,700 Units/hr (01/05/22 1249)   vancomycin 1,250 mg (01/05/22 0841)    Procedures/Studies: CT FEMUR LEFT WO CONTRAST  Result Date: 01/04/2022 CLINICAL DATA:  Acute left thigh pain. Evaluate for hematoma. On IV heparin. EXAM: CT OF THE LOWER LEFT EXTREMITY WITHOUT CONTRAST TECHNIQUE: Multidetector CT imaging of the lower left extremity was performed according to the standard protocol. RADIATION DOSE REDUCTION: This exam was performed according to the departmental dose-optimization program which includes automated exposure control, adjustment of the mA and/or kV according to patient size and/or use of iterative reconstruction technique. COMPARISON:  None Available. FINDINGS: Bones/Joint/Cartilage There is no acute fracture or focal osseous lesion. There is no dislocation. Joint spaces are well maintained. There is no joint effusion identified. Ligaments Suboptimally assessed by CT. Muscles and Tendons Deep fascial planes are preserved. No evidence for fluid collection or hematoma identified. Soft tissues There is diffuse subcutaneous edema with skin thickening of the lateral left thigh. There is also body wall edema. There is no foreign body, fluid collection or hematoma identified. Ascites is present. IMPRESSION: 1. No acute osseous abnormality. 2. Diffuse subcutaneous edema with skin thickening of the lateral left thigh. No evidence for fluid collection or hematoma. 3. Body wall edema. 4. Ascites. Electronically Signed   By: Ronney Asters M.D.   On: 01/04/2022 18:47   US BREAST ASPIRATION RIGHT  Result Date: 01/01/2022 INDICATION: RIGHT BREAST MASTITIS, UNDERLYING ABSCESS, RECENT BIOPSY EXAM: Ultrasound right breast needle aspiration MEDICATIONS: 1% lidocaine local ANESTHESIA/SEDATION: Moderate (conscious) sedation was employed during this procedure. A total of Versed 2.0  mg and Fentanyl 100 mcg was administered intravenously by the radiology nurse. Total intra-service moderate Sedation Time: 17 minutes. The patient's level of consciousness and vital signs were monitored continuously by radiology nursing throughout the procedure under my direct supervision. COMPLICATIONS: None immediate. PROCEDURE: Informed written consent was obtained from the patient after a thorough discussion of the procedural risks, benefits and alternatives. All questions were addressed. Maximal Sterile Barrier Technique was utilized including caps, mask, sterile gowns, sterile gloves, sterile drape, hand hygiene and skin antiseptic. A timeout was performed prior to the initiation of the procedure. Previous imaging reviewed. Preliminary on performed. Complex retroareolar hypoechoic fluid pocket was localized at the 6 o'clock position. Overlying skin marked. Under sterile conditions and local anesthesia, the Yueh sheath needle was advanced into the hypoechoic fluid collection  with direct ultrasound guidance. Needle position confirmed with ultrasound. Images obtained for documentation. 8 cc purulent fluid aspirated. Sample sent for culture. Survey of the breast demonstrates diffuse edema. No additional large fluid pocket localized by ultrasound for additional aspiration or drainage. IMPRESSION: Ultrasound-guided right breast abscess needle aspiration. Electronically Signed   By: Jerilynn Mages.  Shick M.D.   On: 01/01/2022 15:59   US BREAST LTD UNI RIGHT INC AXILLA  Result Date: 12/31/2021 CLINICAL DATA:  RIGHT breast mass. Patient had ultrasound-guided core biopsy of mass in the retroareolar RIGHT breast performed 02/01/2020, showing benign concordant acute and chronic inflammation. Surgical consultation was recommended for discordant findings. More recently, patient presented to the ED secondary to complaints of worsening right breast pain and swelling. She was admitted for further workup. She underwent a punch biopsy  of the right breast on 12/29/2021; results are pending. Per discussion with the technologist, patient has active drainage of purulent fluid from the MEDIAL aspect of the RIGHT breast, discrete from the biopsy site. EXAM: ULTRASOUND OF THE RIGHT BREAST COMPARISON:  02/01/2020 FINDINGS: Targeted ultrasound is performed, showing marked skin thickening in the circumareolar and retroareolar regions of the RIGHT breast. A discrete hypoechoic collection is contiguous with the thickened skin in the immediate retroareolar region. This collection is 1.8 x 2.7 x 2.0 centimeters. Considerations include inflammatory changes or abscess. IMPRESSION: Suspect retroareolar abscess measuring 2.7 centimeters. RECOMMENDATION: Consider ultrasound-guided aspiration of right breast. Recommend annual screening mammography. Last bilateral mammogram was performed 01/20/2020. BI-RADS CATEGORY  2: Benign. These results will be called to the ordering clinician or representative by the Radiologist Assistant, and communication documented in the PACS or [employer]. Electronically Signed   By: Nolon Nations M.D.   On: 12/31/2021 12:23  CT Chest W Contrast  Result Date: 12/16/2021 CLINICAL DATA:  Chest wall mass EXAM: CT CHEST WITH CONTRAST TECHNIQUE: Multidetector CT imaging of the chest was performed during intravenous contrast administration. RADIATION DOSE REDUCTION: This exam was performed according to the departmental dose-optimization program which includes automated exposure control, adjustment of the mA and/or kV according to patient size and/or use of iterative reconstruction technique. CONTRAST:  18m OMNIPAQUE IOHEXOL 300 MG/ML  SOLN COMPARISON:  Mammogram 01/20/2020, breast ultrasound 02/01/2020, 01/20/2020, chest CT 06/05/2021. Chest x-ray 12/16/2021 FINDINGS: Cardiovascular: Nonaneurysmal aorta. Borderline cardiomegaly. No pericardial effusion. Mediastinum/Nodes: Midline trachea. No thyroid mass. Esophagus within normal  limits. Multiple enlarged right axillary lymph nodes measuring up to 2.3 cm. Lungs/Pleura: No acute airspace disease, pleural effusion, or pneumothorax. Mild subpleural reticulation within the right anterior upper lobe. Upper Abdomen: Small volume ascites adjacent to the liver and spleen. Generalized mesenteric stranding. Partially visualized IVC filter. 2 cm hyperenhancing area in the central liver, series 2, image 154. This appears present on 06/05/2021 chest CT with suggestion of peripheral enhancement as may be seen with hemangioma. Musculoskeletal: No acute osseous abnormality. Marked diffuse skin thickening and generalized edema and stranding throughout the entirety of the right breast. No discrete rim enhancing collection by CT. The nipple areola complex is poorly defined but suspect that the nipple is retracted. There is generalized edema throughout the subcutaneous soft tissues consistent with anasarca. IMPRESSION: 1. Marked diffuse skin thickening involving the right breast with generalized edema and soft tissue stranding. Poorly defined nipple areola complex but suspect that the nipple is retracted, correlate with direct inspection. Primary differential considerations include inflammatory breast cancer versus mastitis. Surgical consultation is recommended. There are multiple enlarged right axillary lymph nodes. 2. There is considerable generalized subcutaneous edema  diffusely consistent with anasarca. There is small volume ascites within the upper abdomen. 3. Hyperenhancing lesion within the central liver, potential hemangioma. When the patient is clinically stable and able to follow directions and hold their breath (preferably as an outpatient) further evaluation with dedicated abdominal MRI should be considered. Electronically Signed   By: Donavan Foil M.D.   On: 12/16/2021 16:13   DG Chest 2 View  Result Date: 12/16/2021 CLINICAL DATA:  cough EXAM: CHEST - 2 VIEW COMPARISON:  09/14/2018 FINDINGS:  Cardiac silhouette enlarged. No evidence of pneumothorax or pleural effusion. No evidence of pulmonary edema or pneumonia. There are thoracic degenerative changes IMPRESSION: Enlarged cardiac silhouette.  No focal consolidation Electronically Signed   By: Sammie Bench M.D.   On: 12/16/2021 12:01    Orson Eva, DO  [facility]  If 7PM-7AM, please contact night-coverage [email or web address] Password [email or web address] 01/05/2022, 4:31 PM   LOS: 8 days

## 2022-01-06 DIAGNOSIS — D509 Iron deficiency anemia, unspecified: Secondary | ICD-10-CM | POA: Diagnosis not present

## 2022-01-06 DIAGNOSIS — I5043 Acute on chronic combined systolic (congestive) and diastolic (congestive) heart failure: Secondary | ICD-10-CM | POA: Diagnosis not present

## 2022-01-06 DIAGNOSIS — N61 Mastitis without abscess: Secondary | ICD-10-CM | POA: Diagnosis not present

## 2022-01-06 LAB — CBC
HCT: 30.7 % — ABNORMAL LOW (ref 36.0–46.0)
Hemoglobin: 8.5 g/dL — ABNORMAL LOW (ref 12.0–15.0)
MCH: 21.1 pg — ABNORMAL LOW (ref 26.0–34.0)
MCHC: 27.7 g/dL — ABNORMAL LOW (ref 30.0–36.0)
MCV: 76.4 fL — ABNORMAL LOW (ref 80.0–100.0)
Platelets: 159 10*3/uL (ref 150–400)
RBC: 4.02 MIL/uL (ref 3.87–5.11)
RDW: 26.5 % — ABNORMAL HIGH (ref 11.5–15.5)
WBC: 4.2 10*3/uL (ref 4.0–10.5)
nRBC: 0 % (ref 0.0–0.2)

## 2022-01-06 LAB — PROTIME-INR
INR: 1.7 — ABNORMAL HIGH (ref 0.8–1.2)
Prothrombin Time: 20.2 seconds — ABNORMAL HIGH (ref 11.4–15.2)

## 2022-01-06 LAB — BASIC METABOLIC PANEL
Anion gap: 7 (ref 5–15)
BUN: 13 mg/dL (ref 6–20)
CO2: 26 mmol/L (ref 22–32)
Calcium: 8.7 mg/dL — ABNORMAL LOW (ref 8.9–10.3)
Chloride: 102 mmol/L (ref 98–111)
Creatinine, Ser: 0.93 mg/dL (ref 0.44–1.00)
GFR, Estimated: >60 mL/min (ref >60)
Glucose, Bld: 82 mg/dL (ref 70–99)
Potassium: 3.5 mmol/L (ref 3.5–5.1)
Sodium: 135 mmol/L (ref 135–145)

## 2022-01-06 LAB — MAGNESIUM: Magnesium: 1.9 mg/dL (ref 1.7–2.4)

## 2022-01-06 LAB — HEPARIN LEVEL (UNFRACTIONATED): Heparin Unfractionated: 0.62 IU/mL (ref 0.30–0.70)

## 2022-01-06 MED ORDER — WARFARIN SODIUM 7.5 MG PO TABS
7.5000 mg | ORAL_TABLET | Freq: Once | ORAL | Status: AC
  Administered 2022-01-06: 7.5 mg via ORAL
  Filled 2022-01-06: qty 1

## 2022-01-06 MED ORDER — POTASSIUM CHLORIDE CRYS ER 20 MEQ PO TBCR: 20.0000 meq | EXTENDED_RELEASE_TABLET | Freq: Once | ORAL | Status: DC

## 2022-01-06 MED ORDER — POTASSIUM CHLORIDE CRYS ER 20 MEQ PO TBCR
20.0000 meq | EXTENDED_RELEASE_TABLET | Freq: Every day | ORAL | Status: DC
  Administered 2022-01-06 – 2022-01-08 (×3): 20 meq via ORAL
  Filled 2022-01-06 (×3): qty 1

## 2022-01-06 NOTE — Progress Notes (Signed)
PROGRESS NOTE  MELANNIE METZNER KTG:[id number] DOB: 1975/12/25 DOA: 12/28/2021 PCP: Carrolyn Meiers, MD  Brief History:  46 y.o. female, known history of DVT PE 2015 IVC filter prior on Xarelto --- recurrence DVT/PE January 2023 status post thrombectomy on lifelong anticoagulation with Coumadin secondary to weight >250 pounds, OSA on CPAP, Known uterine fibroids, Smoker With recent admission to [facility] in sept 2023 due to syncope, felt to be vasovagal, combined CHF history (EF 30-35%).Patient presented to the ED secondary to complaints of worsening right breast pain and swelling, simply discharged on oral Augmentin without much improvement, she was admitted for further workup.  During the hospitalization, she was noted to be in decompensated HF.  She was started on IV lasix.  Cardiology was consulted.   Assessment/Plan:   Cellulitis of right breast with abscess/mastitis -With worsening right breast cellulitis despite being compliant with antibiotics, it is a concern for malignancy, especially with abnormal mammogram last year despite negative biopsy. -Continue with IV vancomycin given she failed oral Augmentin as an outpatient. -Pt going to [facility] 12/12 for IR to have US guided aspiration of right breast abscess.  Holding IV heparin for procedure.   -Medical suspicion for malignancy, so general surgery to perform punch biopsy during hospital stay, awaiting biopsy results  -ED discussed with Dr. Oneida Arenas, oncology, to be followed as an outpatient, as likely further imaging and liver biopsy needed in the outpatient setting.   - Breast US 12/31/21--US aspiration   - 12/9 biopsy>>acute on chronic inflammation -added cefepime for GNR>>Final culture = mixed anaerobes -02/01/2020 right breast bx>>neg malignancy -discussed with Dr. Okey Dupre -outpt follow up with med/onc    Acute on Chronic combined systolic and diastolic CHF - Echocardiogram from 08/23/2021 showed EF dropped to  30 to 35% from 45-50 % back on Jun 05, 2021 -This echo also showed moderate to severe mitral regurg that was directed posteriorly, -Grade 3 diastolic dysfunction was noted (restrictive pattern) -This echo also showed severe dilatation of the left ventricular internal cavity with LVIDd..  With worsening pulmonary artery pressures (Compared to prior TTE on 05/2021, the EF has dropped to  30-35% from 45-50%, the LV is now severely enlarged, there is now at least  moderate MR (previously mild) and the filling pressures are significantly  elevated. ) -remains clinically fluid overloaded -appreciate cardiology consult -continue IV lasix -I/O incomplete -hypotension has limited GDMT>>lower dose losartan, metoprolol, spiro -12/15 ReDS= 55 -12/17 ReDS = 48   DVT/PE (2015/2023) Chronic anticoagulation -INR was reversed after FFP given for biopsy procedure -sp thrombectomy (iliac) and IVC filter -holding warfarin for now in case further procedures needed -surgery said that heparin bridge is ok (temporarily holding 12/12 for procedure) -consulted pharmD to manage heparin bridge  -restarted warfarin -cannot use DOAC due to weight   Anemia of iron deficiency -  Hemoglobin down to 7 -  transfused 2 units PRBC 12/11  -  transfuse slowly and provide IV lasix between units -  Iron studies confirm iron deficiency (likely from menorrhagia) -  Hgb remains largely stable since transfusion -given nulecit x 2    obesity/OSA/pulmonary hypertension - continue CPAP nightly    Morbid Obesity -BMI 45.88 -lifestyle modification   Left Thigh Pain -CT thigh--neg for fluid collection or hematoma -now improved       Family Communication:   no Family at bedside   Consultants:  none   Code Status:  FULL  DVT Prophylaxis:  IV Heparin      Procedures: As Listed in Progress Note Above   Antibiotics: None   Subjective:  Patient denies fevers, chills, headache, chest pain, dyspnea, nausea,  vomiting, diarrhea, abdominal pain, dysuria, hematuria, hematochezia, and melena. States breast is continuing to improve in pain, erythema Objective: Vitals:   01/05/22 2100 01/06/22 0431 01/06/22 0800 01/06/22 1356  BP: 107/68 97/84 104/71 (!) 107/56  Pulse: 77 76 78 78  Resp: '18 18 18 18  '$ Temp: 98.3 F (36.8 C) 98.1 F (36.7 C)  98.4 F (36.9 C)  TempSrc: Oral Oral    SpO2: 100% 100%  96%  Weight:      Height:        Intake/Output Summary (Last 24 hours) at 01/06/2022 1543 Last data filed at 01/06/2022 1324 Gross per 24 hour  Intake 2841.24 ml  Output 1050 ml  Net 1791.24 ml   Weight change:  Exam:  General:  Pt is alert, follows commands appropriately, not in acute distress HEENT: No icterus, No thrush, No neck mass, [place]/AT Cardiovascular: RRR, S1/S2, no rubs, no gallops Respiratory: fine bibasilar crackles. No wheeze Abdomen: Soft/+BS, non tender, non distended, no guarding Extremities: 2 + LE edema, No lymphangitis, No petechiae, No rashes, no synovitis   Data Reviewed: I have personally reviewed following labs and imaging studies Basic Metabolic Panel: Recent Labs  Lab 12/31/21 0429 01/01/22 0644 01/02/22 0448 01/03/22 0325 01/04/22 0359 01/06/22 0514  NA 133* 136 135 133* 136 135  K 3.1* 4.1 3.8 3.7 3.7 3.5  CL 99 99 103 101 103 102  CO2 '26 29 27 25 26 26  '$ GLUCOSE 118* 94 92 93 87 82  BUN '20 18 18 17 15 13  '$ CREATININE 1.08* 1.10* 1.12* 1.07* 0.97 0.93  CALCIUM 7.9* 8.4* 8.5* 8.4* 8.7* 8.7*  MG 1.8 2.1  --   --  2.0 1.9   Liver Function Tests: No results for input(s): "AST", "ALT", "ALKPHOS", "BILITOT", "PROT", "ALBUMIN" in the last 168 hours. No results for input(s): "LIPASE", "AMYLASE" in the last 168 hours. No results for input(s): "AMMONIA" in the last 168 hours. Coagulation Profile: Recent Labs  Lab 01/03/22 0325 01/04/22 0359 01/05/22 0625 01/06/22 0514  INR 1.4* 1.7* 1.6* 1.7*   CBC: Recent Labs  Lab 01/01/22 0644 01/02/22 0448  01/03/22 0325 01/04/22 0359 01/06/22 0514  WBC 5.6 4.7 3.8* 4.3 4.2  HGB 8.6* 9.0* 8.5* 8.2* 8.5*  HCT 30.6* 32.5* 30.5* 28.9* 30.7*  MCV 73.4* 74.5* 74.9* 74.1* 76.4*  PLT 249 245 241 193 159   Cardiac Enzymes: No results for input(s): "CKTOTAL", "CKMB", "CKMBINDEX", "TROPONINI" in the last 168 hours. BNP: Invalid input(s): "POCBNP" CBG: No results for input(s): "GLUCAP" in the last 168 hours. HbA1C: No results for input(s): "HGBA1C" in the last 72 hours. Urine analysis:    Component Value Date/Time   COLORURINE YELLOW 09/22/2021 1506   APPEARANCEUR HAZY (A) 09/22/2021 1506   APPEARANCEUR Cloudy (A) 07/21/2014 1326   LABSPEC 1.008 09/22/2021 1506   PHURINE 6.0 09/22/2021 1506   GLUCOSEU NEGATIVE 09/22/2021 1506   HGBUR NEGATIVE 09/22/2021 1506   BILIRUBINUR NEGATIVE 09/22/2021 1506   BILIRUBINUR Negative 07/21/2014 1326   KETONESUR NEGATIVE 09/22/2021 1506   PROTEINUR NEGATIVE 09/22/2021 1506   UROBILINOGEN 0.2 06/30/2014 0910   NITRITE NEGATIVE 09/22/2021 1506   LEUKOCYTESUR TRACE (A) 09/22/2021 1506   Sepsis Labs: '@LABRCNTIP'$ (procalcitonin:4,lacticidven:4) ) Recent Results (from the past 240 hour(s))  Aerobic/Anaerobic Culture w Gram Stain (surgical/deep wound)  Status: None   Collection Time: 01/01/22 12:22 PM   Specimen: Abscess  Result Value Ref Range Status   Specimen Description ABSCESS  Final   Special Requests  BREAST RIGHT  Final   Gram Stain   Final    ABUNDANT WBC PRESENT, PREDOMINANTLY PMN MODERATE GRAM POSITIVE COCCI FEW GRAM NEGATIVE RODS Performed at [facility], [place] [address], [place], [place] [address]    Culture   Final    MIXED ANAEROBIC FLORA PRESENT.  CALL LAB IF FURTHER IID REQUIRED.   Report Status 01/05/2022 FINAL  Final     Scheduled Meds:  atorvastatin  20 mg Oral Daily   furosemide  40 mg Intravenous Daily   losartan  12.5 mg Oral Daily   metoprolol succinate  25 mg Oral Daily   metroNIDAZOLE  500 mg Oral Q12H    midodrine  2.5 mg Oral BID WC   potassium chloride  10 mEq Oral q AM   spironolactone  12.5 mg Oral Daily   warfarin  7.5 mg Oral ONCE-1600   Warfarin - Pharmacist Dosing Inpatient   Does not apply q1600   Continuous Infusions:  ceFEPime (MAXIPIME) IV 2 g (01/06/22 1324)   heparin 2,700 Units/hr (01/06/22 0828)   vancomycin 1,250 mg (01/06/22 0844)    Procedures/Studies: CT FEMUR LEFT WO CONTRAST  Result Date: 01/04/2022 CLINICAL DATA:  Acute left thigh pain. Evaluate for hematoma. On IV heparin. EXAM: CT OF THE LOWER LEFT EXTREMITY WITHOUT CONTRAST TECHNIQUE: Multidetector CT imaging of the lower left extremity was performed according to the standard protocol. RADIATION DOSE REDUCTION: This exam was performed according to the departmental dose-optimization program which includes automated exposure control, adjustment of the mA and/or kV according to patient size and/or use of iterative reconstruction technique. COMPARISON:  None Available. FINDINGS: Bones/Joint/Cartilage There is no acute fracture or focal osseous lesion. There is no dislocation. Joint spaces are well maintained. There is no joint effusion identified. Ligaments Suboptimally assessed by CT. Muscles and Tendons Deep fascial planes are preserved. No evidence for fluid collection or hematoma identified. Soft tissues There is diffuse subcutaneous edema with skin thickening of the lateral left thigh. There is also body wall edema. There is no foreign body, fluid collection or hematoma identified. Ascites is present. IMPRESSION: 1. No acute osseous abnormality. 2. Diffuse subcutaneous edema with skin thickening of the lateral left thigh. No evidence for fluid collection or hematoma. 3. Body wall edema. 4. Ascites. Electronically Signed   By: Ronney Asters M.D.   On: 01/04/2022 18:47   US BREAST ASPIRATION RIGHT  Result Date: 01/01/2022 INDICATION: RIGHT BREAST MASTITIS, UNDERLYING ABSCESS, RECENT BIOPSY EXAM: Ultrasound right breast  needle aspiration MEDICATIONS: 1% lidocaine local ANESTHESIA/SEDATION: Moderate (conscious) sedation was employed during this procedure. A total of Versed 2.0 mg and Fentanyl 100 mcg was administered intravenously by the radiology nurse. Total intra-service moderate Sedation Time: 17 minutes. The patient's level of consciousness and vital signs were monitored continuously by radiology nursing throughout the procedure under my direct supervision. COMPLICATIONS: None immediate. PROCEDURE: Informed written consent was obtained from the patient after a thorough discussion of the procedural risks, benefits and alternatives. All questions were addressed. Maximal Sterile Barrier Technique was utilized including caps, mask, sterile gowns, sterile gloves, sterile drape, hand hygiene and skin antiseptic. A timeout was performed prior to the initiation of the procedure. Previous imaging reviewed. Preliminary on performed. Complex retroareolar hypoechoic fluid pocket was localized at the 6 o'clock position. Overlying skin marked. Under sterile  conditions and local anesthesia, the Yueh sheath needle was advanced into the hypoechoic fluid collection with direct ultrasound guidance. Needle position confirmed with ultrasound. Images obtained for documentation. 8 cc purulent fluid aspirated. Sample sent for culture. Survey of the breast demonstrates diffuse edema. No additional large fluid pocket localized by ultrasound for additional aspiration or drainage. IMPRESSION: Ultrasound-guided right breast abscess needle aspiration. Electronically Signed   By: Jerilynn Mages.  Shick M.D.   On: 01/01/2022 15:59   US BREAST LTD UNI RIGHT INC AXILLA  Result Date: 12/31/2021 CLINICAL DATA:  RIGHT breast mass. Patient had ultrasound-guided core biopsy of mass in the retroareolar RIGHT breast performed 02/01/2020, showing benign concordant acute and chronic inflammation. Surgical consultation was recommended for discordant findings. More recently,  patient presented to the ED secondary to complaints of worsening right breast pain and swelling. She was admitted for further workup. She underwent a punch biopsy of the right breast on 12/29/2021; results are pending. Per discussion with the technologist, patient has active drainage of purulent fluid from the MEDIAL aspect of the RIGHT breast, discrete from the biopsy site. EXAM: ULTRASOUND OF THE RIGHT BREAST COMPARISON:  02/01/2020 FINDINGS: Targeted ultrasound is performed, showing marked skin thickening in the circumareolar and retroareolar regions of the RIGHT breast. A discrete hypoechoic collection is contiguous with the thickened skin in the immediate retroareolar region. This collection is 1.8 x 2.7 x 2.0 centimeters. Considerations include inflammatory changes or abscess. IMPRESSION: Suspect retroareolar abscess measuring 2.7 centimeters. RECOMMENDATION: Consider ultrasound-guided aspiration of right breast. Recommend annual screening mammography. Last bilateral mammogram was performed 01/20/2020. BI-RADS CATEGORY  2: Benign. These results will be called to the ordering clinician or representative by the Radiologist Assistant, and communication documented in the PACS or [employer]. Electronically Signed   By: Nolon Nations M.D.   On: 12/31/2021 12:23  CT Chest W Contrast  Result Date: 12/16/2021 CLINICAL DATA:  Chest wall mass EXAM: CT CHEST WITH CONTRAST TECHNIQUE: Multidetector CT imaging of the chest was performed during intravenous contrast administration. RADIATION DOSE REDUCTION: This exam was performed according to the departmental dose-optimization program which includes automated exposure control, adjustment of the mA and/or kV according to patient size and/or use of iterative reconstruction technique. CONTRAST:  60m OMNIPAQUE IOHEXOL 300 MG/ML  SOLN COMPARISON:  Mammogram 01/20/2020, breast ultrasound 02/01/2020, 01/20/2020, chest CT 06/05/2021. Chest x-ray 12/16/2021 FINDINGS:  Cardiovascular: Nonaneurysmal aorta. Borderline cardiomegaly. No pericardial effusion. Mediastinum/Nodes: Midline trachea. No thyroid mass. Esophagus within normal limits. Multiple enlarged right axillary lymph nodes measuring up to 2.3 cm. Lungs/Pleura: No acute airspace disease, pleural effusion, or pneumothorax. Mild subpleural reticulation within the right anterior upper lobe. Upper Abdomen: Small volume ascites adjacent to the liver and spleen. Generalized mesenteric stranding. Partially visualized IVC filter. 2 cm hyperenhancing area in the central liver, series 2, image 154. This appears present on 06/05/2021 chest CT with suggestion of peripheral enhancement as may be seen with hemangioma. Musculoskeletal: No acute osseous abnormality. Marked diffuse skin thickening and generalized edema and stranding throughout the entirety of the right breast. No discrete rim enhancing collection by CT. The nipple areola complex is poorly defined but suspect that the nipple is retracted. There is generalized edema throughout the subcutaneous soft tissues consistent with anasarca. IMPRESSION: 1. Marked diffuse skin thickening involving the right breast with generalized edema and soft tissue stranding. Poorly defined nipple areola complex but suspect that the nipple is retracted, correlate with direct inspection. Primary differential considerations include inflammatory breast cancer versus mastitis. Surgical consultation is recommended.  There are multiple enlarged right axillary lymph nodes. 2. There is considerable generalized subcutaneous edema diffusely consistent with anasarca. There is small volume ascites within the upper abdomen. 3. Hyperenhancing lesion within the central liver, potential hemangioma. When the patient is clinically stable and able to follow directions and hold their breath (preferably as an outpatient) further evaluation with dedicated abdominal MRI should be considered. Electronically Signed   By:  Donavan Foil M.D.   On: 12/16/2021 16:13   DG Chest 2 View  Result Date: 12/16/2021 CLINICAL DATA:  cough EXAM: CHEST - 2 VIEW COMPARISON:  09/14/2018 FINDINGS: Cardiac silhouette enlarged. No evidence of pneumothorax or pleural effusion. No evidence of pulmonary edema or pneumonia. There are thoracic degenerative changes IMPRESSION: Enlarged cardiac silhouette.  No focal consolidation Electronically Signed   By: Sammie Bench M.D.   On: 12/16/2021 12:01    Orson Eva, DO  [facility]  If 7PM-7AM, please contact night-coverage [email or web address] Password [email or web address] 01/06/2022, 3:43 PM   LOS: 9 days

## 2022-01-06 NOTE — Progress Notes (Signed)
   01/06/22 1500  ReDS Vest / Clip  Station Marker D  Ruler Value 32  ReDS Value Range (!) > 40  ReDS Actual Value 48

## 2022-01-06 NOTE — Progress Notes (Signed)
ANTICOAGULATION CONSULT NOTE -   Pharmacy Consult for heparin Indication: pulmonary embolus  Allergies  Allergen Reactions   Sulfa Antibiotics Itching, Swelling and Rash    Lip swelling    Patient Measurements: Height: '5\' 11"'$  (180.3 cm) Weight: (!) 158.9 kg (350 lb 5 oz) IBW/kg (Calculated) : 70.8 Heparin Dosing Weight: 107kg  Vital Signs: Temp: 98.1 F (36.7 C) (12/17 0431) Temp Source: Oral (12/17 0431) BP: 97/84 (12/17 0431) Pulse Rate: 76 (12/17 0431)  Labs: Recent Labs    01/04/22 0359 01/05/22 0625 01/06/22 0514  HGB 8.2*  --  8.5*  HCT 28.9*  --  30.7*  PLT 193  --  159  LABPROT 19.6* 19.3* 20.2*  INR 1.7* 1.6* 1.7*  HEPARINUNFRC 0.53 0.63 0.62  CREATININE 0.97  --  0.93     Estimated Creatinine Clearance: 127.8 mL/min (by C-G formula based on SCr of 0.93 mg/dL).   Medical History: Past Medical History:  Diagnosis Date   Abdominal cramps 08/30/2015   Anemia    Blood transfusion without reported diagnosis    CAD (coronary artery disease)    Chronic combined systolic and diastolic CHF (congestive heart failure) (HCC)    Closed left ankle fracture    August 30 2012   Clotting disorder [facility])    DVT (deep venous thrombosis) (HCC)    L leg   Fibroid tumor    Hidradenitis    High blood pressure    Hypertension    Mitral regurgitation    NICM (nonischemic cardiomyopathy) (HCC)    NSVT (nonsustained ventricular tachycardia) (HCC)    Obesity    OSA on CPAP    Oxygen deficiency    Pregnancy    09/20/14 17 weeks   Pulmonary embolism ([place]) 04/2013   Sleep apnea    Tobacco use disorder     Medications:  Medications Prior to Admission  Medication Sig Dispense Refill Last Dose   albuterol (VENTOLIN HFA) 108 (90 Base) MCG/ACT inhaler Inhale 2 puffs into the lungs every 2 (two) hours as needed for wheezing or shortness of breath. 18 g 11 12/28/2021   atorvastatin (LIPITOR) 20 MG tablet Take 1 tablet (20 mg total) by mouth daily. (Patient taking  differently: Take 20 mg by mouth in the morning.) 90 tablet 3 12/28/2021   furosemide (LASIX) 80 MG tablet Take 1 tablet (80 mg total) by mouth daily. (Patient taking differently: Take 80 mg by mouth in the morning.) 30 tablet 3 12/27/2021   losartan (COZAAR) 25 MG tablet Take 1 tablet (25 mg total) by mouth daily. (Patient taking differently: Take 25 mg by mouth in the morning.) 90 tablet 3 12/28/2021   Magnesium Oxide 400 MG CAPS Take 1 capsule (400 mg total) by mouth daily. (Patient taking differently: Take 400 mg by mouth in the morning.) 90 capsule 3 12/28/2021   metoprolol succinate (TOPROL-XL) 50 MG 24 hr tablet Take 1 tablet (50 mg total) by mouth daily. Take with or immediately following a meal. (Patient taking differently: Take 50 mg by mouth in the morning.) 90 tablet 3 12/28/2021 at 0730   potassium chloride (KLOR-CON) 10 MEQ tablet Take 1 tablet (10 mEq total) by mouth daily. (Patient taking differently: Take 10 mEq by mouth in the morning.) 90 tablet 3 12/28/2021   spironolactone (ALDACTONE) 25 MG tablet Take 0.5 tablets (12.5 mg total) by mouth daily. (Patient taking differently: Take 12.5 mg by mouth in the morning.) 30 tablet 3 12/28/2021   warfarin (COUMADIN) 10 MG tablet Take 5-10  mg by mouth See admin instructions. Take as directed in the evening : Monday take 10 mg and all other days take 5 mg.   12/28/2021   warfarin (COUMADIN) 5 MG tablet TAKE (1) OR (1) 1/2 TABLET BY MOUTH DAILY AS DIRECTED.   12/28/2021   amoxicillin-clavulanate (AUGMENTIN) 875-125 MG tablet Take 1 tablet by mouth every 12 (twelve) hours. (Patient not taking: Reported on 12/28/2021) 14 tablet 0 Completed Course   HYDROcodone-acetaminophen (NORCO/VICODIN) 5-325 MG tablet Take one tab po q 4 hrs prn pain (Patient not taking: Reported on 12/28/2021) 8 tablet 0 Completed Course   Scheduled:   atorvastatin  20 mg Oral Daily   furosemide  40 mg Intravenous Daily   losartan  12.5 mg Oral Daily   metoprolol succinate  25 mg  Oral Daily   metroNIDAZOLE  500 mg Oral Q12H   midodrine  2.5 mg Oral BID WC   potassium chloride  10 mEq Oral q AM   spironolactone  12.5 mg Oral Daily   Warfarin - Pharmacist Dosing Inpatient   Does not apply q1600   Infusions:   ceFEPime (MAXIPIME) IV 2 g (01/06/22 0654)   heparin 2,700 Units/hr (01/06/22 0654)   vancomycin 166.7 mL/hr at 01/06/22 0655    Assessment: Coumadin has been on hold for a hx of PE. Plan to bridge with heparin until surgical intervention is done. right breast aspiration 12/12 at [facility] IR.  Restarting warfarin 12/13.  Home dose listed as 10 mg every Monday and 5 mg ROW.  HL 0.63, therapeutic INR 1.4>1.7>1.6> 1.7, subtherapeutic. Patient requiring larger doses than prior to admission home regimen. Hgb 8.5  Goal of Therapy:  Heparin level 0.3-0.7 units/ml Monitor platelets by anticoagulation protocol: Yes   Plan:  Continue Heparin  at 2700 units/hr  Daily heparin level Warfarin 7.5 mg x 1 dose  Daily PT/INR Monitor for S/s of bleeding  Margot Ables, PharmD Clinical Pharmacist 01/06/2022 7:52 AM

## 2022-01-07 DIAGNOSIS — Z79891 Long term (current) use of opiate analgesic: Secondary | ICD-10-CM | POA: Diagnosis not present

## 2022-01-07 DIAGNOSIS — N61 Mastitis without abscess: Secondary | ICD-10-CM | POA: Diagnosis not present

## 2022-01-07 DIAGNOSIS — I5043 Acute on chronic combined systolic (congestive) and diastolic (congestive) heart failure: Secondary | ICD-10-CM | POA: Diagnosis not present

## 2022-01-07 LAB — HEPARIN LEVEL (UNFRACTIONATED): Heparin Unfractionated: 0.55 IU/mL (ref 0.30–0.70)

## 2022-01-07 LAB — BASIC METABOLIC PANEL
Anion gap: 8 (ref 5–15)
BUN: 14 mg/dL (ref 6–20)
CO2: 23 mmol/L (ref 22–32)
Calcium: 8.4 mg/dL — ABNORMAL LOW (ref 8.9–10.3)
Chloride: 103 mmol/L (ref 98–111)
Creatinine, Ser: 0.93 mg/dL (ref 0.44–1.00)
GFR, Estimated: >60 mL/min (ref >60)
Glucose, Bld: 81 mg/dL (ref 70–99)
Potassium: 3.7 mmol/L (ref 3.5–5.1)
Sodium: 134 mmol/L — ABNORMAL LOW (ref 135–145)

## 2022-01-07 LAB — PROTIME-INR
INR: 2 — ABNORMAL HIGH (ref 0.8–1.2)
Prothrombin Time: 22.6 seconds — ABNORMAL HIGH (ref 11.4–15.2)

## 2022-01-07 LAB — MAGNESIUM: Magnesium: 1.9 mg/dL (ref 1.7–2.4)

## 2022-01-07 MED ORDER — MAGNESIUM SULFATE 2 GM/50ML IV SOLN
2.0000 g | Freq: Once | INTRAVENOUS | Status: AC
  Administered 2022-01-07: 2 g via INTRAVENOUS
  Filled 2022-01-07: qty 50

## 2022-01-07 MED ORDER — POTASSIUM CHLORIDE CRYS ER 20 MEQ PO TBCR
40.0000 meq | EXTENDED_RELEASE_TABLET | Freq: Once | ORAL | Status: AC
  Administered 2022-01-07: 40 meq via ORAL
  Filled 2022-01-07: qty 2

## 2022-01-07 MED ORDER — FUROSEMIDE 10 MG/ML IJ SOLN
60.0000 mg | Freq: Two times a day (BID) | INTRAMUSCULAR | Status: DC
  Administered 2022-01-07 – 2022-01-10 (×7): 60 mg via INTRAVENOUS
  Filled 2022-01-07 (×7): qty 6

## 2022-01-07 MED ORDER — WARFARIN SODIUM 7.5 MG PO TABS
7.5000 mg | ORAL_TABLET | Freq: Once | ORAL | Status: AC
  Administered 2022-01-07: 7.5 mg via ORAL
  Filled 2022-01-07: qty 1

## 2022-01-07 NOTE — Progress Notes (Signed)
ANTICOAGULATION CONSULT NOTE -   Pharmacy Consult for heparin Indication: pulmonary embolus  Allergies  Allergen Reactions   Sulfa Antibiotics Itching, Swelling and Rash    Lip swelling    Patient Measurements: Height: '5\' 11"'$  (180.3 cm) Weight: (!) 160.6 kg (354 lb 0.9 oz) IBW/kg (Calculated) : 70.8 Heparin Dosing Weight: 107kg  Vital Signs: Temp: 99.4 F (37.4 C) (12/18 0445) Temp Source: Oral (12/18 0445) BP: 115/81 (12/18 0445) Pulse Rate: 73 (12/18 0445)  Labs: Recent Labs    01/05/22 0625 01/06/22 0514 01/07/22 0433  HGB  --  8.5*  --   HCT  --  30.7*  --   PLT  --  159  --   LABPROT 19.3* 20.2* 22.6*  INR 1.6* 1.7* 2.0*  HEPARINUNFRC 0.63 0.62 0.55  CREATININE  --  0.93 0.93     Estimated Creatinine Clearance: 128.7 mL/min (by C-G formula based on SCr of 0.93 mg/dL).   Medical History: Past Medical History:  Diagnosis Date   Abdominal cramps 08/30/2015   Anemia    Blood transfusion without reported diagnosis    CAD (coronary artery disease)    Chronic combined systolic and diastolic CHF (congestive heart failure) (HCC)    Closed left ankle fracture    August 30 2012   Clotting disorder [facility])    DVT (deep venous thrombosis) (HCC)    L leg   Fibroid tumor    Hidradenitis    High blood pressure    Hypertension    Mitral regurgitation    NICM (nonischemic cardiomyopathy) (HCC)    NSVT (nonsustained ventricular tachycardia) (HCC)    Obesity    OSA on CPAP    Oxygen deficiency    Pregnancy    09/20/14 17 weeks   Pulmonary embolism ([place]) 04/2013   Sleep apnea    Tobacco use disorder     Medications:  Medications Prior to Admission  Medication Sig Dispense Refill Last Dose   albuterol (VENTOLIN HFA) 108 (90 Base) MCG/ACT inhaler Inhale 2 puffs into the lungs every 2 (two) hours as needed for wheezing or shortness of breath. 18 g 11 12/28/2021   atorvastatin (LIPITOR) 20 MG tablet Take 1 tablet (20 mg total) by mouth daily. (Patient taking  differently: Take 20 mg by mouth in the morning.) 90 tablet 3 12/28/2021   furosemide (LASIX) 80 MG tablet Take 1 tablet (80 mg total) by mouth daily. (Patient taking differently: Take 80 mg by mouth in the morning.) 30 tablet 3 12/27/2021   losartan (COZAAR) 25 MG tablet Take 1 tablet (25 mg total) by mouth daily. (Patient taking differently: Take 25 mg by mouth in the morning.) 90 tablet 3 12/28/2021   Magnesium Oxide 400 MG CAPS Take 1 capsule (400 mg total) by mouth daily. (Patient taking differently: Take 400 mg by mouth in the morning.) 90 capsule 3 12/28/2021   metoprolol succinate (TOPROL-XL) 50 MG 24 hr tablet Take 1 tablet (50 mg total) by mouth daily. Take with or immediately following a meal. (Patient taking differently: Take 50 mg by mouth in the morning.) 90 tablet 3 12/28/2021 at 0730   potassium chloride (KLOR-CON) 10 MEQ tablet Take 1 tablet (10 mEq total) by mouth daily. (Patient taking differently: Take 10 mEq by mouth in the morning.) 90 tablet 3 12/28/2021   spironolactone (ALDACTONE) 25 MG tablet Take 0.5 tablets (12.5 mg total) by mouth daily. (Patient taking differently: Take 12.5 mg by mouth in the morning.) 30 tablet 3 12/28/2021   warfarin (  COUMADIN) 10 MG tablet Take 5-10 mg by mouth See admin instructions. Take as directed in the evening : Monday take 10 mg and all other days take 5 mg.   12/28/2021   warfarin (COUMADIN) 5 MG tablet TAKE (1) OR (1) 1/2 TABLET BY MOUTH DAILY AS DIRECTED.   12/28/2021   amoxicillin-clavulanate (AUGMENTIN) 875-125 MG tablet Take 1 tablet by mouth every 12 (twelve) hours. (Patient not taking: Reported on 12/28/2021) 14 tablet 0 Completed Course   HYDROcodone-acetaminophen (NORCO/VICODIN) 5-325 MG tablet Take one tab po q 4 hrs prn pain (Patient not taking: Reported on 12/28/2021) 8 tablet 0 Completed Course   Scheduled:   atorvastatin  20 mg Oral Daily   furosemide  40 mg Intravenous Daily   losartan  12.5 mg Oral Daily   metoprolol succinate  25 mg  Oral Daily   metroNIDAZOLE  500 mg Oral Q12H   midodrine  2.5 mg Oral BID WC   potassium chloride  20 mEq Oral Daily   spironolactone  12.5 mg Oral Daily   Warfarin - Pharmacist Dosing Inpatient   Does not apply q1600   Infusions:   ceFEPime (MAXIPIME) IV 2 g (01/07/22 0413)   heparin 2,700 Units/hr (01/06/22 1919)   vancomycin 1,250 mg (01/06/22 2018)    Assessment: Coumadin has been on hold for a hx of PE. Plan to bridge with heparin until surgical intervention is done. right breast aspiration 12/12 at [facility].  Restarting warfarin 12/13.  Home dose listed as 10 mg every Monday and 5 mg ROW.  HL 0.55, therapeutic INR 1.4>1.7>1.6> 1.7> 2, therapeutic. Will continue heparin x 24 more hours to ensure remains therapeutic. Patient requiring larger doses than prior to admission home regimen. Hgb 8.5 stable  Goal of Therapy:  INR 2-3 Heparin level 0.3-0.7 units/ml Monitor platelets by anticoagulation protocol: Yes   Plan:  Continue Heparin  at 2700 units/hr  Daily heparin level Warfarin 7.5 mg x 1 dose  Daily PT/INR Monitor for S/s of bleeding  Isac Sarna, BS Pharm D, BCPS Clinical Pharmacist 01/07/2022 7:40 AM

## 2022-01-07 NOTE — Progress Notes (Signed)
PROGRESS NOTE  [name] [name] YSA:[id number] DOB: Nov 03, 1975 DOA: 12/28/2021 PCP: Carrolyn Meiers, MD   Brief History:  46 y.o. female, known history of DVT PE 2015 IVC filter prior on Xarelto --- recurrence DVT/PE January 2023 status post thrombectomy on lifelong anticoagulation with Coumadin secondary to weight >250 pounds, OSA on CPAP, Known uterine fibroids, Smoker With recent admission to [facility] in sept 2023 due to syncope, felt to be vasovagal, combined CHF history (EF 30-35%).Patient presented to the ED secondary to complaints of worsening right breast pain and swelling, simply discharged on oral Augmentin without much improvement, she was admitted for further workup.  During the hospitalization, she was noted to be in decompensated HF.  She was started on IV lasix.  Cardiology was consulted.   Assessment/Plan:   Cellulitis of right breast with abscess/mastitis -With worsening right breast cellulitis despite being compliant with antibiotics, it is a concern for malignancy, especially with abnormal mammogram last year despite negative biopsy. -Continue with IV vancomycin given she failed oral Augmentin as an outpatient. -Pt going to [facility] 12/12 for IR to have US guided aspiration of right breast abscess.  Holding IV heparin for procedure.   -Medical suspicion for malignancy, so general surgery to perform punch biopsy during hospital stay, awaiting biopsy results  -ED discussed with Dr. Oneida Arenas, oncology, to be followed as an outpatient, as likely further imaging and liver biopsy needed in the outpatient setting.   - Breast US 12/31/21--US aspiration   - 12/9 biopsy>>acute on chronic inflammation -added cefepime for GNR>>Final culture = mixed anaerobes -02/01/2020 right breast bx>>neg malignancy -discussed with Dr. Okey Dupre -outpt follow up with med/onc    Acute on Chronic combined systolic and diastolic CHF - Echocardiogram from 08/23/2021 showed EF dropped  to 30 to 35% from 45-50 % back on Jun 05, 2021 -This echo also showed moderate to severe mitral regurg that was directed posteriorly, -Grade 3 diastolic dysfunction was noted (restrictive pattern) -This echo also showed severe dilatation of the left ventricular internal cavity with LVIDd..  With worsening pulmonary artery pressures (Compared to prior TTE on 05/2021, the EF has dropped to  30-35% from 45-50%, the LV is now severely enlarged, there is now at least moderate MR (previously mild) and the filling pressures are significantly elevated.  -remains clinically fluid overloaded -appreciate cardiology consult -continue IV lasix>.increased to 60 mg bid -I/O incomplete -hypotension has limited GDMT>>lower dose losartan, metoprolol, spiro -12/15 ReDS= 55 -12/17 ReDS = 48   DVT/PE (2015/2023) Chronic anticoagulation -INR was reversed after FFP given for biopsy procedure -sp thrombectomy (iliac) and IVC filter -holding warfarin for now in case further procedures needed -surgery said that heparin bridge is ok (temporarily holding 12/12 for procedure) -consulted pharmD to manage heparin bridge  -restarted warfarin -cannot use DOAC due to weight   Anemia of iron deficiency -  Hemoglobin down to 7 -  transfused 2 units PRBC 12/11  -  Iron studies confirm iron deficiency (likely from menorrhagia) -  Hgb remains largely stable since transfusion -given nulecit x 2    obesity/OSA/pulmonary hypertension - continue CPAP nightly    Morbid Obesity -BMI 45.88 -lifestyle modification   Left Thigh Pain -CT thigh--neg for fluid collection or hematoma -now improved but still intermittent lateral discomfort       Family Communication:   spouse at bedside 12/18   Consultants:  none   Code Status:  FULL    DVT Prophylaxis:  IV Heparin      Procedures: As Listed in Progress Note Above   Antibiotics: Cefepime 12/13>> Vanc 12/9>> Metronidazole 12/15>>   Subjective: Pt complains  of intermittent left lateral thigh tender.  Denies f/c, cp, sob, n/v/d.   Objective: Vitals:   01/07/22 0400 01/07/22 0445 01/07/22 1000 01/07/22 1354  BP:  115/81 104/66 121/85  Pulse:  73 80 91  Resp:  16  18  Temp:  99.4 F (37.4 C)  98.7 F (37.1 C)  TempSrc:  Oral  Oral  SpO2:  98%  100%  Weight: (!) 160.6 kg     Height:        Intake/Output Summary (Last 24 hours) at 01/07/2022 1455 Last data filed at 01/07/2022 1429 Gross per 24 hour  Intake 1199.88 ml  Output 2150 ml  Net -950.12 ml   Weight change:  Exam:  General:  Pt is alert, follows commands appropriately, not in acute distress HEENT: No icterus, No thrush, No neck mass, Golden/AT Cardiovascular: RRR, S1/S2, no rubs, no gallops Respiratory:bibasilar rales. No wheeze Abdomen: Soft/+BS, non tender, non distended, no guarding Extremities: No edema, No lymphangitis, No petechiae, No rashes, no synovitis   Data Reviewed: I have personally reviewed following labs and imaging studies Basic Metabolic Panel: Recent Labs  Lab 01/01/22 0644 01/02/22 0448 01/03/22 0325 01/04/22 0359 01/06/22 0514 01/07/22 0433  NA 136 135 133* 136 135 134*  K 4.1 3.8 3.7 3.7 3.5 3.7  CL 99 103 101 103 102 103  CO2 '29 27 25 26 26 23  '$ GLUCOSE 94 92 93 87 82 81  BUN '18 18 17 15 13 14  '$ CREATININE 1.10* 1.12* 1.07* 0.97 0.93 0.93  CALCIUM 8.4* 8.5* 8.4* 8.7* 8.7* 8.4*  MG 2.1  --   --  2.0 1.9 1.9   Liver Function Tests: No results for input(s): "AST", "ALT", "ALKPHOS", "BILITOT", "PROT", "ALBUMIN" in the last 168 hours. No results for input(s): "LIPASE", "AMYLASE" in the last 168 hours. No results for input(s): "AMMONIA" in the last 168 hours. Coagulation Profile: Recent Labs  Lab 01/03/22 0325 01/04/22 0359 01/05/22 0625 01/06/22 0514 01/07/22 0433  INR 1.4* 1.7* 1.6* 1.7* 2.0*   CBC: Recent Labs  Lab 01/01/22 0644 01/02/22 0448 01/03/22 0325 01/04/22 0359 01/06/22 0514  WBC 5.6 4.7 3.8* 4.3 4.2  HGB 8.6* 9.0*  8.5* 8.2* 8.5*  HCT 30.6* 32.5* 30.5* 28.9* 30.7*  MCV 73.4* 74.5* 74.9* 74.1* 76.4*  PLT 249 245 241 193 159   Cardiac Enzymes: No results for input(s): "CKTOTAL", "CKMB", "CKMBINDEX", "TROPONINI" in the last 168 hours. BNP: Invalid input(s): "POCBNP" CBG: No results for input(s): "GLUCAP" in the last 168 hours. HbA1C: No results for input(s): "HGBA1C" in the last 72 hours. Urine analysis:    Component Value Date/Time   COLORURINE YELLOW 09/22/2021 1506   APPEARANCEUR HAZY (A) 09/22/2021 1506   APPEARANCEUR Cloudy (A) 07/21/2014 1326   LABSPEC 1.008 09/22/2021 1506   PHURINE 6.0 09/22/2021 1506   GLUCOSEU NEGATIVE 09/22/2021 1506   HGBUR NEGATIVE 09/22/2021 1506   BILIRUBINUR NEGATIVE 09/22/2021 1506   BILIRUBINUR Negative 07/21/2014 1326   KETONESUR NEGATIVE 09/22/2021 1506   PROTEINUR NEGATIVE 09/22/2021 1506   UROBILINOGEN 0.2 06/30/2014 0910   NITRITE NEGATIVE 09/22/2021 1506   LEUKOCYTESUR TRACE (A) 09/22/2021 1506   Sepsis Labs: '@LABRCNTIP'$ (procalcitonin:4,lacticidven:4) ) Recent Results (from the past 240 hour(s))  Aerobic/Anaerobic Culture w Gram Stain (surgical/deep wound)     Status: None   Collection Time: 01/01/22 12:22 PM  Specimen: Abscess  Result Value Ref Range Status   Specimen Description ABSCESS  Final   Special Requests  BREAST RIGHT  Final   Gram Stain   Final    ABUNDANT WBC PRESENT, PREDOMINANTLY PMN MODERATE GRAM POSITIVE COCCI FEW GRAM NEGATIVE RODS Performed at [facility], [address], [place], [place] [address]    Culture   Final    MIXED ANAEROBIC FLORA PRESENT.  CALL LAB IF FURTHER IID REQUIRED.   Report Status 01/05/2022 FINAL  Final     Scheduled Meds:  atorvastatin  20 mg Oral Daily   furosemide  60 mg Intravenous BID   losartan  12.5 mg Oral Daily   metoprolol succinate  25 mg Oral Daily   metroNIDAZOLE  500 mg Oral Q12H   midodrine  2.5 mg Oral BID WC   potassium chloride  20 mEq Oral Daily   spironolactone   12.5 mg Oral Daily   warfarin  7.5 mg Oral ONCE-1600   Warfarin - Pharmacist Dosing Inpatient   Does not apply q1600   Continuous Infusions:  ceFEPime (MAXIPIME) IV 2 g (01/07/22 0413)   heparin 2,700 Units/hr (01/07/22 0816)   vancomycin 1,250 mg (01/07/22 0822)    Procedures/Studies: CT FEMUR LEFT WO CONTRAST  Result Date: 01/04/2022 CLINICAL DATA:  Acute left thigh pain. Evaluate for hematoma. On IV heparin. EXAM: CT OF THE LOWER LEFT EXTREMITY WITHOUT CONTRAST TECHNIQUE: Multidetector CT imaging of the lower left extremity was performed according to the standard protocol. RADIATION DOSE REDUCTION: This exam was performed according to the departmental dose-optimization program which includes automated exposure control, adjustment of the mA and/or kV according to patient size and/or use of iterative reconstruction technique. COMPARISON:  None Available. FINDINGS: Bones/Joint/Cartilage There is no acute fracture or focal osseous lesion. There is no dislocation. Joint spaces are well maintained. There is no joint effusion identified. Ligaments Suboptimally assessed by CT. Muscles and Tendons Deep fascial planes are preserved. No evidence for fluid collection or hematoma identified. Soft tissues There is diffuse subcutaneous edema with skin thickening of the lateral left thigh. There is also body wall edema. There is no foreign body, fluid collection or hematoma identified. Ascites is present. IMPRESSION: 1. No acute osseous abnormality. 2. Diffuse subcutaneous edema with skin thickening of the lateral left thigh. No evidence for fluid collection or hematoma. 3. Body wall edema. 4. Ascites. Electronically Signed   By: Ronney Asters M.D.   On: 01/04/2022 18:47   US BREAST ASPIRATION RIGHT  Result Date: 01/01/2022 INDICATION: RIGHT BREAST MASTITIS, UNDERLYING ABSCESS, RECENT BIOPSY EXAM: Ultrasound right breast needle aspiration MEDICATIONS: 1% lidocaine local ANESTHESIA/SEDATION: Moderate  (conscious) sedation was employed during this procedure. A total of Versed 2.0 mg and Fentanyl 100 mcg was administered intravenously by the radiology nurse. Total intra-service moderate Sedation Time: 17 minutes. The patient's level of consciousness and vital signs were monitored continuously by radiology nursing throughout the procedure under my direct supervision. COMPLICATIONS: None immediate. PROCEDURE: Informed written consent was obtained from the patient after a thorough discussion of the procedural risks, benefits and alternatives. All questions were addressed. Maximal Sterile Barrier Technique was utilized including caps, mask, sterile gowns, sterile gloves, sterile drape, hand hygiene and skin antiseptic. A timeout was performed prior to the initiation of the procedure. Previous imaging reviewed. Preliminary on performed. Complex retroareolar hypoechoic fluid pocket was localized at the 6 o'clock position. Overlying skin marked. Under sterile conditions and local anesthesia, the Yueh sheath needle was advanced into the  hypoechoic fluid collection with direct ultrasound guidance. Needle position confirmed with ultrasound. Images obtained for documentation. 8 cc purulent fluid aspirated. Sample sent for culture. Survey of the breast demonstrates diffuse edema. No additional large fluid pocket localized by ultrasound for additional aspiration or drainage. IMPRESSION: Ultrasound-guided right breast abscess needle aspiration. Electronically Signed   By: Jerilynn Mages.  Shick M.D.   On: 01/01/2022 15:59   US BREAST LTD UNI RIGHT INC AXILLA  Result Date: 12/31/2021 CLINICAL DATA:  RIGHT breast mass. Patient had ultrasound-guided core biopsy of mass in the retroareolar RIGHT breast performed 02/01/2020, showing benign concordant acute and chronic inflammation. Surgical consultation was recommended for discordant findings. More recently, patient presented to the ED secondary to complaints of worsening right breast pain  and swelling. She was admitted for further workup. She underwent a punch biopsy of the right breast on 12/29/2021; results are pending. Per discussion with the technologist, patient has active drainage of purulent fluid from the MEDIAL aspect of the RIGHT breast, discrete from the biopsy site. EXAM: ULTRASOUND OF THE RIGHT BREAST COMPARISON:  02/01/2020 FINDINGS: Targeted ultrasound is performed, showing marked skin thickening in the circumareolar and retroareolar regions of the RIGHT breast. A discrete hypoechoic collection is contiguous with the thickened skin in the immediate retroareolar region. This collection is 1.8 x 2.7 x 2.0 centimeters. Considerations include inflammatory changes or abscess. IMPRESSION: Suspect retroareolar abscess measuring 2.7 centimeters. RECOMMENDATION: Consider ultrasound-guided aspiration of right breast. Recommend annual screening mammography. Last bilateral mammogram was performed 01/20/2020. BI-RADS CATEGORY  2: Benign. These results will be called to the ordering clinician or representative by the Radiologist Assistant, and communication documented in the PACS or [employer]. Electronically Signed   By: Nolon Nations M.D.   On: 12/31/2021 12:23  CT Chest W Contrast  Result Date: 12/16/2021 CLINICAL DATA:  Chest wall mass EXAM: CT CHEST WITH CONTRAST TECHNIQUE: Multidetector CT imaging of the chest was performed during intravenous contrast administration. RADIATION DOSE REDUCTION: This exam was performed according to the departmental dose-optimization program which includes automated exposure control, adjustment of the mA and/or kV according to patient size and/or use of iterative reconstruction technique. CONTRAST:  74m OMNIPAQUE IOHEXOL 300 MG/ML  SOLN COMPARISON:  Mammogram 01/20/2020, breast ultrasound 02/01/2020, 01/20/2020, chest CT 06/05/2021. Chest x-ray 12/16/2021 FINDINGS: Cardiovascular: Nonaneurysmal aorta. Borderline cardiomegaly. No pericardial  effusion. Mediastinum/Nodes: Midline trachea. No thyroid mass. Esophagus within normal limits. Multiple enlarged right axillary lymph nodes measuring up to 2.3 cm. Lungs/Pleura: No acute airspace disease, pleural effusion, or pneumothorax. Mild subpleural reticulation within the right anterior upper lobe. Upper Abdomen: Small volume ascites adjacent to the liver and spleen. Generalized mesenteric stranding. Partially visualized IVC filter. 2 cm hyperenhancing area in the central liver, series 2, image 154. This appears present on 06/05/2021 chest CT with suggestion of peripheral enhancement as may be seen with hemangioma. Musculoskeletal: No acute osseous abnormality. Marked diffuse skin thickening and generalized edema and stranding throughout the entirety of the right breast. No discrete rim enhancing collection by CT. The nipple areola complex is poorly defined but suspect that the nipple is retracted. There is generalized edema throughout the subcutaneous soft tissues consistent with anasarca. IMPRESSION: 1. Marked diffuse skin thickening involving the right breast with generalized edema and soft tissue stranding. Poorly defined nipple areola complex but suspect that the nipple is retracted, correlate with direct inspection. Primary differential considerations include inflammatory breast cancer versus mastitis. Surgical consultation is recommended. There are multiple enlarged right axillary lymph nodes. 2. There is considerable  generalized subcutaneous edema diffusely consistent with anasarca. There is small volume ascites within the upper abdomen. 3. Hyperenhancing lesion within the central liver, potential hemangioma. When the patient is clinically stable and able to follow directions and hold their breath (preferably as an outpatient) further evaluation with dedicated abdominal MRI should be considered. Electronically Signed   By: Donavan Foil M.D.   On: 12/16/2021 16:13   DG Chest 2 View  Result Date:  12/16/2021 CLINICAL DATA:  cough EXAM: CHEST - 2 VIEW COMPARISON:  09/14/2018 FINDINGS: Cardiac silhouette enlarged. No evidence of pneumothorax or pleural effusion. No evidence of pulmonary edema or pneumonia. There are thoracic degenerative changes IMPRESSION: Enlarged cardiac silhouette.  No focal consolidation Electronically Signed   By: Sammie Bench M.D.   On: 12/16/2021 12:01    Orson Eva, DO  [facility]  If 7PM-7AM, please contact night-coverage [email or web address] Password [email or web address] 01/07/2022, 2:55 PM   LOS: 10 days

## 2022-01-07 NOTE — Progress Notes (Signed)
Pt complained of pain in her left leg. Stated it felt like stabbing pain. PRN oxy given.

## 2022-01-07 NOTE — Progress Notes (Signed)
Progress Note  Patient Name: [name] [name] Date of Encounter: 01/07/2022  Primary Cardiologist: Carlyle Dolly, MD  Subjective   No acute events overnight.  She denied having any symptoms of shortness of breath but her leg is still swollen.  Inpatient Medications    Scheduled Meds:  atorvastatin  20 mg Oral Daily   furosemide  60 mg Intravenous Q12H   losartan  12.5 mg Oral Daily   metoprolol succinate  25 mg Oral Daily   metroNIDAZOLE  500 mg Oral Q12H   midodrine  2.5 mg Oral BID WC   potassium chloride  20 mEq Oral Daily   spironolactone  12.5 mg Oral Daily   warfarin  7.5 mg Oral ONCE-1600   Warfarin - Pharmacist Dosing Inpatient   Does not apply q1600   Continuous Infusions:  ceFEPime (MAXIPIME) IV 2 g (01/07/22 0413)   heparin 2,700 Units/hr (01/07/22 0816)   vancomycin 1,250 mg (01/07/22 0822)   PRN Meds: acetaminophen **OR** acetaminophen, albuterol, oxyCODONE   Vital Signs    Vitals:   01/06/22 2027 01/07/22 0400 01/07/22 0445 01/07/22 1000  BP: 120/81  115/81 104/66  Pulse: 76  73 80  Resp: 20  16   Temp: 99.9 F (37.7 C)  99.4 F (37.4 C)   TempSrc: Oral  Oral   SpO2: 100%  98%   Weight:  (!) 160.6 kg    Height:        Intake/Output Summary (Last 24 hours) at 01/07/2022 1025 Last data filed at 01/07/2022 0500 Gross per 24 hour  Intake 1679.88 ml  Output 2150 ml  Net -470.12 ml   Filed Weights   01/04/22 1000 01/06/22 1500 01/07/22 0400  Weight: (!) 158.9 kg (!) 158.6 kg (!) 160.6 kg    Telemetry     Personally reviewed, HR controlled and NSR  ECG    NSR and RBBB  Physical Exam   GEN: No acute distress.   Neck: Unable to examine JVD due to body habitus Cardiac: RRR, no murmur, rub, or gallop.  Respiratory: Nonlabored. Clear to auscultation bilaterally. GI: Soft, nontender, bowel sounds present. MS: 2+ pitting edema in b/l LE; No deformity. Neuro:  Nonfocal. Psych: Alert and oriented x 3. Normal affect.  Labs     Chemistry Recent Labs  Lab 01/04/22 0359 01/06/22 0514 01/07/22 0433  NA 136 135 134*  K 3.7 3.5 3.7  CL 103 102 103  CO2 '26 26 23  '$ GLUCOSE 87 82 81  BUN '15 13 14  '$ CREATININE 0.97 0.93 0.93  CALCIUM 8.7* 8.7* 8.4*  GFRNONAA >60 >60 >60  ANIONGAP '7 7 8     '$ Hematology Recent Labs  Lab 01/03/22 0325 01/04/22 0359 01/06/22 0514  WBC 3.8* 4.3 4.2  RBC 4.07 3.90 4.02  HGB 8.5* 8.2* 8.5*  HCT 30.5* 28.9* 30.7*  MCV 74.9* 74.1* 76.4*  MCH 20.9* 21.0* 21.1*  MCHC 27.9* 28.4* 27.7*  RDW 24.4* 25.2* 26.5*  PLT 241 193 159    Cardiac EnzymesNo results for input(s): "TROPONINIHS" in the last 720 hours.  BNP Recent Labs  Lab 01/03/22 0325  BNP 1,232.0*     DDimerNo results for input(s): "DDIMER" in the last 168 hours.   Radiology    No results found.  Cardiac Studies  Echo from 09/2021 LVEF 30 to 25% RV systolic function is mildly reduced LA moderately dilated RA mildly dilated Moderate to severe posteriorly directed MR TR is mild to moderate   Assessment & Plan  Patient is a 46 year old F known to have NICM LVEF 30 to 35% with moderate to severe MR, DVT/PE history in 2015 s/p IVC filter with recurrence of DVT/PE in 1/23 s/p thrombectomy on lifelong Coumadin, OSA on CPAP presented to the ER with worsening right breast pain and swelling, currently admitted to hospitalist team for the management of right breast cellulitis and ADHF.  # ADHF/NICM LVEF 30-35% with moderate to severe MR/NYHA class II/ACC AHA stage C, currently decompensated -Switch p.o. to IV Lasix 60 mg twice daily -Continue metoprolol succinate 25 mg once daily -Continue losartan 12.5 mg once daily -Continue spironolactone 12.5 mg once daily -Daily weights, 1.2 L fluid restriction and urine output -Continue midodrine for blood pressure support -She will benefit from outpatient advanced heart failure follow-up due to inability to titrate GDMT from soft blood pressures.  # Valvular heart  disease, moderate to severe functional MR -Echo will need to be repeated after GDMT optimization to evaluate for MR severity and ICD candidacy.  # DVT/PE history in 2015 s/p IVC filter with recurrence of DVT/PE in 1/23 s/p thrombectomy on lifelong Coumadin -Management per primary team  # Right breast cellulitis -Management per primary team  I have spent a total of 33 minutes with patient reviewing chart , telemetry, EKGs, labs and examining patient as well as establishing an assessment and plan that was discussed with the patient.  > 50% of time was spent in direct patient care.      Signed, Chalmers Guest, MD  01/07/2022, 10:25 AM

## 2022-01-07 NOTE — Progress Notes (Signed)
Noted pt's heparin drip not infusing on first rounds this am. Pt states that pump was turned off for blood draw this am and not restarted. New bag heparin hung, infusing now without difficulty. MD Tat notified. Pt c/o left leg pain, ordered prn pain med admin'd.

## 2022-01-08 ENCOUNTER — Encounter: Payer: Medicare HMO | Admitting: Obstetrics & Gynecology

## 2022-01-08 DIAGNOSIS — N61 Mastitis without abscess: Secondary | ICD-10-CM | POA: Diagnosis not present

## 2022-01-08 DIAGNOSIS — Z7901 Long term (current) use of anticoagulants: Secondary | ICD-10-CM | POA: Diagnosis not present

## 2022-01-08 DIAGNOSIS — I5043 Acute on chronic combined systolic (congestive) and diastolic (congestive) heart failure: Secondary | ICD-10-CM | POA: Diagnosis not present

## 2022-01-08 LAB — CBC
HCT: 30 % — ABNORMAL LOW (ref 36.0–46.0)
Hemoglobin: 8.5 g/dL — ABNORMAL LOW (ref 12.0–15.0)
MCH: 21.5 pg — ABNORMAL LOW (ref 26.0–34.0)
MCHC: 28.3 g/dL — ABNORMAL LOW (ref 30.0–36.0)
MCV: 75.9 fL — ABNORMAL LOW (ref 80.0–100.0)
Platelets: 136 10*3/uL — ABNORMAL LOW (ref 150–400)
RBC: 3.95 MIL/uL (ref 3.87–5.11)
RDW: 27.3 % — ABNORMAL HIGH (ref 11.5–15.5)
WBC: 4.3 10*3/uL (ref 4.0–10.5)
nRBC: 0 % (ref 0.0–0.2)

## 2022-01-08 LAB — BASIC METABOLIC PANEL
Anion gap: 8 (ref 5–15)
BUN: 15 mg/dL (ref 6–20)
CO2: 22 mmol/L (ref 22–32)
Calcium: 8.4 mg/dL — ABNORMAL LOW (ref 8.9–10.3)
Chloride: 103 mmol/L (ref 98–111)
Creatinine, Ser: 1.02 mg/dL — ABNORMAL HIGH (ref 0.44–1.00)
GFR, Estimated: >60 mL/min (ref >60)
Glucose, Bld: 82 mg/dL (ref 70–99)
Potassium: 4 mmol/L (ref 3.5–5.1)
Sodium: 133 mmol/L — ABNORMAL LOW (ref 135–145)

## 2022-01-08 LAB — HEPARIN LEVEL (UNFRACTIONATED): Heparin Unfractionated: 0.72 IU/mL — ABNORMAL HIGH (ref 0.30–0.70)

## 2022-01-08 LAB — PROTIME-INR
INR: 1.9 — ABNORMAL HIGH (ref 0.8–1.2)
Prothrombin Time: 21.5 seconds — ABNORMAL HIGH (ref 11.4–15.2)

## 2022-01-08 LAB — MAGNESIUM: Magnesium: 2 mg/dL (ref 1.7–2.4)

## 2022-01-08 MED ORDER — POTASSIUM CHLORIDE CRYS ER 20 MEQ PO TBCR
20.0000 meq | EXTENDED_RELEASE_TABLET | Freq: Once | ORAL | Status: AC
  Administered 2022-01-08: 20 meq via ORAL
  Filled 2022-01-08: qty 1

## 2022-01-08 MED ORDER — POTASSIUM CHLORIDE CRYS ER 20 MEQ PO TBCR
40.0000 meq | EXTENDED_RELEASE_TABLET | Freq: Every day | ORAL | Status: DC
  Administered 2022-01-09 – 2022-01-11 (×3): 40 meq via ORAL
  Filled 2022-01-08 (×3): qty 2

## 2022-01-08 MED ORDER — DOXYCYCLINE HYCLATE 100 MG PO TABS
100.0000 mg | ORAL_TABLET | Freq: Two times a day (BID) | ORAL | Status: DC
  Administered 2022-01-08 – 2022-01-11 (×6): 100 mg via ORAL
  Filled 2022-01-08 (×6): qty 1

## 2022-01-08 MED ORDER — WARFARIN SODIUM 5 MG PO TABS
10.0000 mg | ORAL_TABLET | Freq: Once | ORAL | Status: AC
  Administered 2022-01-08: 10 mg via ORAL
  Filled 2022-01-08: qty 2

## 2022-01-08 MED ORDER — AMOXICILLIN-POT CLAVULANATE 875-125 MG PO TABS
1.0000 | ORAL_TABLET | Freq: Two times a day (BID) | ORAL | Status: DC
  Administered 2022-01-08 – 2022-01-11 (×6): 1 via ORAL
  Filled 2022-01-08 (×6): qty 1

## 2022-01-08 NOTE — Progress Notes (Signed)
Pt continues on IV ABX.  Minimal drainage from right breast.  .  CPAP at noc.   Dyspnea with exertion.  Pt on room air.  Continues on heparin gtt, no evidence of bleeding.  Pain in LLE managed with prn oxycodone.  NSR on monitor.

## 2022-01-08 NOTE — Progress Notes (Signed)
Progress Note  Patient Name: [name] [name] Date of Encounter: 01/08/2022  Primary Cardiologist: Carlyle Dolly, MD  Subjective   No acute events overnight. She urinated 4.6 L with a net negative of 2 L in the last 24 hours. Her swelling in lower extremities improved compared to yesterday.  Inpatient Medications    Scheduled Meds:  atorvastatin  20 mg Oral Daily   furosemide  60 mg Intravenous BID   losartan  12.5 mg Oral Daily   metoprolol succinate  25 mg Oral Daily   metroNIDAZOLE  500 mg Oral Q12H   midodrine  2.5 mg Oral BID WC   potassium chloride  20 mEq Oral Daily   spironolactone  12.5 mg Oral Daily   Warfarin - Pharmacist Dosing Inpatient   Does not apply q1600   Continuous Infusions:  ceFEPime (MAXIPIME) IV 2 g (01/08/22 0504)   heparin 2,700 Units/hr (01/08/22 0427)   vancomycin 1,250 mg (01/08/22 0856)   PRN Meds: acetaminophen **OR** acetaminophen, albuterol, oxyCODONE   Vital Signs    Vitals:   01/07/22 2059 01/08/22 0534 01/08/22 0743 01/08/22 0845  BP: (!) 104/59 98/65  108/62  Pulse: 65 79  78  Resp: 18 18    Temp: 98.1 F (36.7 C) 99.7 F (37.6 C)    TempSrc: Oral Oral    SpO2: 100% 100%    Weight:   (!) 159.9 kg   Height:        Intake/Output Summary (Last 24 hours) at 01/08/2022 1029 Last data filed at 01/08/2022 1027 Gross per 24 hour  Intake 2799.32 ml  Output 5700 ml  Net -2900.68 ml   Filed Weights   01/06/22 1500 01/07/22 0400 01/08/22 0743  Weight: (!) 158.6 kg (!) 160.6 kg (!) 159.9 kg    Telemetry     Personally reviewed, HR controlled and NSR  ECG    NSR and RBBB  Physical Exam   GEN: No acute distress.   Neck: Unable to examine JVD due to body habitus Cardiac: RRR, no murmur, rub, or gallop.  Respiratory: Nonlabored. Clear to auscultation bilaterally. GI: Soft, nontender, bowel sounds present. MS: 2+ pitting edema in b/l LE; No deformity. Neuro:  Nonfocal. Psych: Alert and oriented x 3. Normal  affect.  Labs    Chemistry Recent Labs  Lab 01/06/22 0514 01/07/22 0433 01/08/22 0508  NA 135 134* 133*  K 3.5 3.7 4.0  CL 102 103 103  CO2 '26 23 22  '$ GLUCOSE 82 81 82  BUN '13 14 15  '$ CREATININE 0.93 0.93 1.02*  CALCIUM 8.7* 8.4* 8.4*  GFRNONAA >60 >60 >60  ANIONGAP '7 8 8     '$ Hematology Recent Labs  Lab 01/04/22 0359 01/06/22 0514 01/08/22 0508  WBC 4.3 4.2 4.3  RBC 3.90 4.02 3.95  HGB 8.2* 8.5* 8.5*  HCT 28.9* 30.7* 30.0*  MCV 74.1* 76.4* 75.9*  MCH 21.0* 21.1* 21.5*  MCHC 28.4* 27.7* 28.3*  RDW 25.2* 26.5* 27.3*  PLT 193 159 136*    Cardiac EnzymesNo results for input(s): "TROPONINIHS" in the last 720 hours.  BNP Recent Labs  Lab 01/03/22 0325  BNP 1,232.0*     DDimerNo results for input(s): "DDIMER" in the last 168 hours.   Radiology    No results found.  Cardiac Studies  Echo from 09/2021 LVEF 30 to 61% RV systolic function is mildly reduced LA moderately dilated RA mildly dilated Moderate to severe posteriorly directed MR TR is mild to moderate   Assessment &  Plan   Patient is a 46 year old F known to have NICM LVEF 30 to 35% with moderate to severe MR, DVT/PE history in 2015 s/p IVC filter with recurrence of DVT/PE in 1/23 s/p thrombectomy on lifelong Coumadin, OSA on CPAP presented to the ER with worsening right breast pain and swelling, currently admitted to hospitalist team for the management of right breast cellulitis and ADHF.  # ADHF/NICM LVEF 30-35% with moderate to severe MR/NYHA class II/ACC AHA stage C, currently decompensated -Continue IV Lasix 60 mg twice daily -Continue metoprolol succinate 25 mg once daily -Continue losartan 12.5 mg once daily -Continue spironolactone 12.5 mg once daily -Daily weights, 1.2L of fluid restriction and daily in and outs -Continue midodrine for blood pressure support -She will benefit from outpatient advanced heart failure follow-up due to inability to titrate GDMT from soft blood pressures  #  Valvular heart disease, moderate to severe functional MR -Echo will need to be repeated after GDMT optimization to evaluate for MR severity and ICD candidacy, outpatient setting.  # DVT/PE history in 2015 s/p IVC filter with recurrence of DVT/PE in 1/23 s/p thrombectomy on lifelong Coumadin -Management per primary team  # Right breast cellulitis -Management per primary team  I have spent a total of 35 minutes with patient reviewing chart , telemetry, EKGs, labs and examining patient as well as establishing an assessment and plan that was discussed with the patient.  > 50% of time was spent in direct patient care.      Signed, Chalmers Guest, MD  01/08/2022, 10:29 AM

## 2022-01-08 NOTE — Progress Notes (Addendum)
ANTICOAGULATION CONSULT NOTE -   Pharmacy Consult for heparin Indication: pulmonary embolus  Allergies  Allergen Reactions   Sulfa Antibiotics Itching, Swelling and Rash    Lip swelling    Patient Measurements: Height: '5\' 11"'$  (180.3 cm) Weight: (!) 159.9 kg (352 lb 8 oz) IBW/kg (Calculated) : 70.8 Heparin Dosing Weight: 107kg  Vital Signs: Temp: 99.7 F (37.6 C) (12/19 0534) Temp Source: Oral (12/19 0534) BP: 108/62 (12/19 0845) Pulse Rate: 78 (12/19 0845)  Labs: Recent Labs    01/06/22 0514 01/07/22 0433 01/08/22 0508  HGB 8.5*  --  8.5*  HCT 30.7*  --  30.0*  PLT 159  --  136*  LABPROT 20.2* 22.6* 21.5*  INR 1.7* 2.0* 1.9*  HEPARINUNFRC 0.62 0.55 0.72*  CREATININE 0.93 0.93 1.02*     Estimated Creatinine Clearance: 117 mL/min (A) (by C-G formula based on SCr of 1.02 mg/dL (H)).   Medical History: Past Medical History:  Diagnosis Date   Abdominal cramps 08/30/2015   Anemia    Blood transfusion without reported diagnosis    CAD (coronary artery disease)    Chronic combined systolic and diastolic CHF (congestive heart failure) (HCC)    Closed left ankle fracture    August 30 2012   Clotting disorder [facility])    DVT (deep venous thrombosis) (HCC)    L leg   Fibroid tumor    Hidradenitis    High blood pressure    Hypertension    Mitral regurgitation    NICM (nonischemic cardiomyopathy) (HCC)    NSVT (nonsustained ventricular tachycardia) (HCC)    Obesity    OSA on CPAP    Oxygen deficiency    Pregnancy    09/20/14 17 weeks   Pulmonary embolism ([place]) 04/2013   Sleep apnea    Tobacco use disorder     Medications:  Medications Prior to Admission  Medication Sig Dispense Refill Last Dose   albuterol (VENTOLIN HFA) 108 (90 Base) MCG/ACT inhaler Inhale 2 puffs into the lungs every 2 (two) hours as needed for wheezing or shortness of breath. 18 g 11 12/28/2021   atorvastatin (LIPITOR) 20 MG tablet Take 1 tablet (20 mg total) by mouth daily. (Patient  taking differently: Take 20 mg by mouth in the morning.) 90 tablet 3 12/28/2021   furosemide (LASIX) 80 MG tablet Take 1 tablet (80 mg total) by mouth daily. (Patient taking differently: Take 80 mg by mouth in the morning.) 30 tablet 3 12/27/2021   losartan (COZAAR) 25 MG tablet Take 1 tablet (25 mg total) by mouth daily. (Patient taking differently: Take 25 mg by mouth in the morning.) 90 tablet 3 12/28/2021   Magnesium Oxide 400 MG CAPS Take 1 capsule (400 mg total) by mouth daily. (Patient taking differently: Take 400 mg by mouth in the morning.) 90 capsule 3 12/28/2021   metoprolol succinate (TOPROL-XL) 50 MG 24 hr tablet Take 1 tablet (50 mg total) by mouth daily. Take with or immediately following a meal. (Patient taking differently: Take 50 mg by mouth in the morning.) 90 tablet 3 12/28/2021 at 0730   potassium chloride (KLOR-CON) 10 MEQ tablet Take 1 tablet (10 mEq total) by mouth daily. (Patient taking differently: Take 10 mEq by mouth in the morning.) 90 tablet 3 12/28/2021   spironolactone (ALDACTONE) 25 MG tablet Take 0.5 tablets (12.5 mg total) by mouth daily. (Patient taking differently: Take 12.5 mg by mouth in the morning.) 30 tablet 3 12/28/2021   warfarin (COUMADIN) 10 MG tablet Take 5-10  mg by mouth See admin instructions. Take as directed in the evening : Monday take 10 mg and all other days take 5 mg.   12/28/2021   warfarin (COUMADIN) 5 MG tablet TAKE (1) OR (1) 1/2 TABLET BY MOUTH DAILY AS DIRECTED.   12/28/2021   amoxicillin-clavulanate (AUGMENTIN) 875-125 MG tablet Take 1 tablet by mouth every 12 (twelve) hours. (Patient not taking: Reported on 12/28/2021) 14 tablet 0 Completed Course   HYDROcodone-acetaminophen (NORCO/VICODIN) 5-325 MG tablet Take one tab po q 4 hrs prn pain (Patient not taking: Reported on 12/28/2021) 8 tablet 0 Completed Course   Scheduled:   atorvastatin  20 mg Oral Daily   furosemide  60 mg Intravenous BID   losartan  12.5 mg Oral Daily   metoprolol succinate  25  mg Oral Daily   metroNIDAZOLE  500 mg Oral Q12H   midodrine  2.5 mg Oral BID WC   potassium chloride  20 mEq Oral Daily   spironolactone  12.5 mg Oral Daily   Warfarin - Pharmacist Dosing Inpatient   Does not apply q1600   Infusions:   ceFEPime (MAXIPIME) IV 2 g (01/08/22 0504)   heparin 2,700 Units/hr (01/08/22 0427)   vancomycin 1,250 mg (01/08/22 0856)    Assessment: Coumadin has been on hold for a hx of PE. Plan to bridge with heparin until surgical intervention is done. right breast aspiration 12/12 at [facility].  Restarting warfarin 12/13.  Home dose listed as 10 mg every Monday and 5 mg ROW.  HL 0.72> , just slightly about therapeutic goal INR 1.4>1.7>1.6> 1.7> 2> 1.9,dropped just below goal of therapeutic today.   Patient requiring larger doses than prior to admission home regimen. Hgb 8.5 stable  Goal of Therapy:  INR 2-3 Heparin level 0.3-0.7 units/ml Monitor platelets by anticoagulation protocol: Yes   Plan:  Decrease Heparin  to 2600 units/hr  Daily heparin level Warfarin 10 mg x 1 dose  Daily PT/INR Monitor for S/s of bleeding  Isac Sarna, BS Pharm D, BCPS Clinical Pharmacist 01/08/2022 10:40 AM

## 2022-01-08 NOTE — Progress Notes (Signed)
   01/08/22 1710  Vitals  Temp (!) 102.3 F (39.1 C)  Temp Source Oral  BP 97/61  MAP (mmHg) 72  BP Location Left Arm  BP Method Automatic  Patient Position (if appropriate) Lying  Pulse Rate 79  Pulse Rate Source Monitor  Resp 18  MEWS COLOR  MEWS Score Color Yellow  Oxygen Therapy  SpO2 99 %  O2 Device Room Air  MEWS Score  MEWS Temp 2  MEWS Systolic 1  MEWS Pulse 0  MEWS RR 0  MEWS LOC 0  MEWS Score 3   Pt with temp as noted above. Pt A&O with no c/o at this time. Right breast has had more drainage than yesterday and pt has had more pain in breast today. Pain from both breast and left thigh has been controlled with oral oxycontin. MD Tat notified of temp, orders received. Tylenol 650 mg po admin'd per order.

## 2022-01-08 NOTE — Progress Notes (Signed)
PROGRESS NOTE  [name] [name] NTZ:[id number] DOB: 1975-04-07 DOA: 12/28/2021 PCP: Carrolyn Meiers, MD  Brief History:  46 y.o. female, known history of DVT PE 2015 IVC filter prior on Xarelto --- recurrence DVT/PE January 2023 status post thrombectomy on lifelong anticoagulation with Coumadin secondary to weight >250 pounds, OSA on CPAP, Known uterine fibroids, Smoker With recent admission to [facility] in sept 2023 due to syncope, felt to be vasovagal, combined CHF history (EF 30-35%).Patient presented to the ED secondary to complaints of worsening right breast pain and swelling, simply discharged on oral Augmentin without much improvement, she was admitted for further workup.  During the hospitalization, she was noted to be in decompensated HF.  She was started on IV lasix.  Cardiology was consulted.   Assessment/Plan:   Cellulitis of right breast with abscess/mastitis -With worsening right breast cellulitis despite being compliant with antibiotics, it is a concern for malignancy, especially with abnormal mammogram last year despite negative biopsy. -Continue with IV vancomycin given she failed oral Augmentin as an outpatient. -Pt going to [facility] 12/12 for IR to have US guided aspiration of right breast abscess.  Holding IV heparin for procedure.   -Medical suspicion for malignancy, so general surgery to perform punch biopsy during hospital stay, awaiting biopsy results  -ED discussed with Dr. Oneida Arenas, oncology, to be followed as an outpatient, as likely further imaging and liver biopsy needed in the outpatient setting.   - Breast US 12/31/21--US aspiration   - 12/9 biopsy>>acute on chronic inflammation -added cefepime for GNR>>Final culture = mixed anaerobes -02/01/2020 right breast bx>>neg malignancy -discussed with Dr. Okey Dupre -outpt follow up with med/onc -1219--d/c IV antibiotics--plan 7 days amox/clav + doxy    Acute on Chronic combined systolic and diastolic  CHF - Echocardiogram from 08/23/2021 showed EF dropped to 30 to 35% from 45-50 % back on Jun 05, 2021 -This echo also showed moderate to severe mitral regurg that was directed posteriorly, -Grade 3 diastolic dysfunction was noted (restrictive pattern) -This echo also showed severe dilatation of the left ventricular internal cavity with LVIDd..  With worsening pulmonary artery pressures (Compared to prior TTE on 05/2021, the EF has dropped to  30-35% from 45-50%, the LV is now severely enlarged, there is now at least moderate MR (previously mild) and the filling pressures are significantly elevated.  -remains clinically fluid overloaded -appreciate cardiology consult -continue IV lasix>.increased to 60 mg bid 12/28 -4600 cc out in last 24hr since increase lasix -hypotension has limited GDMT>>lower dose losartan, metoprolol, spiro -12/15 ReDS= 55 -12/17 ReDS = 48   DVT/PE (2015/2023) Chronic anticoagulation -INR was reversed after FFP given for biopsy procedure -sp thrombectomy (iliac) and IVC filter -holding warfarin for now in case further procedures needed -surgery said that heparin bridge is ok (temporarily holding 12/12 for procedure) -consulted pharmD to manage heparin bridge  -restarted warfarin -cannot use DOAC due to weight   Anemia of iron deficiency -  Hemoglobin down to 7 -  transfused 2 units PRBC 12/11  -  Iron studies confirm iron deficiency (likely from menorrhagia) -  Hgb remains largely stable since transfusion -given nulecit x 2    obesity/OSA/pulmonary hypertension - continue CPAP nightly    Morbid Obesity -BMI 45.88 -lifestyle modification   Left Thigh Pain -CT thigh--neg for fluid collection or hematoma -now improved but still intermittent lateral discomfort -ambulates without difficulty       Family Communication:   spouse at  bedside 12/18   Consultants:  none   Code Status:  FULL    DVT Prophylaxis:  IV Heparin      Procedures: As Listed in  Progress Note Above   Antibiotics: Cefepime 12/13>>12/19 Vanc 12/9>>12/19 Metronidazole 12/15>>12/19 Amox/clav 12/19>> Doxy 12/19>>         Subjective: Patient denies fevers, chills, headache, chest pain, dyspnea, nausea, vomiting, diarrhea, abdominal pain, dysuria, hematuria, hematochezia, and melena.   Objective: Vitals:   01/07/22 2059 01/08/22 0534 01/08/22 0743 01/08/22 0845  BP: (!) 104/59 98/65  108/62  Pulse: 65 79  78  Resp: 18 18    Temp: 98.1 F (36.7 C) 99.7 F (37.6 C)    TempSrc: Oral Oral    SpO2: 100% 100%    Weight:   (!) 159.9 kg   Height:        Intake/Output Summary (Last 24 hours) at 01/08/2022 1647 Last data filed at 01/08/2022 1027 Gross per 24 hour  Intake 2799.32 ml  Output 4300 ml  Net -1500.68 ml   Weight change:  Exam:  General:  Pt is alert, follows commands appropriately, not in acute distress HEENT: No icterus, No thrush, No neck mass, [place]/AT Cardiovascular: RRR, S1/S2, no rubs, no gallops Respiratory: fine bibasilar crackles. Now wheeze Abdomen: Soft/+BS, non tender, non distended, no guarding Extremities: 2 +LE edema, No lymphangitis, No petechiae, No rashes, no synovitis   Data Reviewed: I have personally reviewed following labs and imaging studies Basic Metabolic Panel: Recent Labs  Lab 01/03/22 0325 01/04/22 0359 01/06/22 0514 01/07/22 0433 01/08/22 0508  NA 133* 136 135 134* 133*  K 3.7 3.7 3.5 3.7 4.0  CL 101 103 102 103 103  CO2 '25 26 26 23 22  '$ GLUCOSE 93 87 82 81 82  BUN '17 15 13 14 15  '$ CREATININE 1.07* 0.97 0.93 0.93 1.02*  CALCIUM 8.4* 8.7* 8.7* 8.4* 8.4*  MG  --  2.0 1.9 1.9 2.0   Liver Function Tests: No results for input(s): "AST", "ALT", "ALKPHOS", "BILITOT", "PROT", "ALBUMIN" in the last 168 hours. No results for input(s): "LIPASE", "AMYLASE" in the last 168 hours. No results for input(s): "AMMONIA" in the last 168 hours. Coagulation Profile: Recent Labs  Lab 01/04/22 0359 01/05/22 0625  01/06/22 0514 01/07/22 0433 01/08/22 0508  INR 1.7* 1.6* 1.7* 2.0* 1.9*   CBC: Recent Labs  Lab 01/02/22 0448 01/03/22 0325 01/04/22 0359 01/06/22 0514 01/08/22 0508  WBC 4.7 3.8* 4.3 4.2 4.3  HGB 9.0* 8.5* 8.2* 8.5* 8.5*  HCT 32.5* 30.5* 28.9* 30.7* 30.0*  MCV 74.5* 74.9* 74.1* 76.4* 75.9*  PLT 245 241 193 159 136*   Cardiac Enzymes: No results for input(s): "CKTOTAL", "CKMB", "CKMBINDEX", "TROPONINI" in the last 168 hours. BNP: Invalid input(s): "POCBNP" CBG: No results for input(s): "GLUCAP" in the last 168 hours. HbA1C: No results for input(s): "HGBA1C" in the last 72 hours. Urine analysis:    Component Value Date/Time   COLORURINE YELLOW 09/22/2021 1506   APPEARANCEUR HAZY (A) 09/22/2021 1506   APPEARANCEUR Cloudy (A) 07/21/2014 1326   LABSPEC 1.008 09/22/2021 1506   PHURINE 6.0 09/22/2021 1506   GLUCOSEU NEGATIVE 09/22/2021 1506   HGBUR NEGATIVE 09/22/2021 1506   BILIRUBINUR NEGATIVE 09/22/2021 1506   BILIRUBINUR Negative 07/21/2014 1326   KETONESUR NEGATIVE 09/22/2021 1506   PROTEINUR NEGATIVE 09/22/2021 1506   UROBILINOGEN 0.2 06/30/2014 0910   NITRITE NEGATIVE 09/22/2021 1506   LEUKOCYTESUR TRACE (A) 09/22/2021 1506   Sepsis Labs: '@LABRCNTIP'$ (procalcitonin:4,lacticidven:4) ) Recent Results (from the past  240 hour(s))  Aerobic/Anaerobic Culture w Gram Stain (surgical/deep wound)     Status: None   Collection Time: 01/01/22 12:22 PM   Specimen: Abscess  Result Value Ref Range Status   Specimen Description ABSCESS  Final   Special Requests  BREAST RIGHT  Final   Gram Stain   Final    ABUNDANT WBC PRESENT, PREDOMINANTLY PMN MODERATE GRAM POSITIVE COCCI FEW GRAM NEGATIVE RODS Performed at [facility], [address] [place], [place] [address]    Culture   Final    MIXED ANAEROBIC FLORA PRESENT.  CALL LAB IF FURTHER IID REQUIRED.   Report Status 01/05/2022 FINAL  Final     Scheduled Meds:  amoxicillin-clavulanate  1 tablet Oral Q12H    atorvastatin  20 mg Oral Daily   doxycycline  100 mg Oral Q12H   furosemide  60 mg Intravenous BID   losartan  12.5 mg Oral Daily   metoprolol succinate  25 mg Oral Daily   metroNIDAZOLE  500 mg Oral Q12H   midodrine  2.5 mg Oral BID WC   [START ON 01/09/2022] potassium chloride  40 mEq Oral Daily   spironolactone  12.5 mg Oral Daily   warfarin  10 mg Oral ONCE-1600   Warfarin - Pharmacist Dosing Inpatient   Does not apply q1600   Continuous Infusions:  heparin 2,600 Units/hr (01/08/22 1323)    Procedures/Studies: CT FEMUR LEFT WO CONTRAST  Result Date: 01/04/2022 CLINICAL DATA:  Acute left thigh pain. Evaluate for hematoma. On IV heparin. EXAM: CT OF THE LOWER LEFT EXTREMITY WITHOUT CONTRAST TECHNIQUE: Multidetector CT imaging of the lower left extremity was performed according to the standard protocol. RADIATION DOSE REDUCTION: This exam was performed according to the departmental dose-optimization program which includes automated exposure control, adjustment of the mA and/or kV according to patient size and/or use of iterative reconstruction technique. COMPARISON:  None Available. FINDINGS: Bones/Joint/Cartilage There is no acute fracture or focal osseous lesion. There is no dislocation. Joint spaces are well maintained. There is no joint effusion identified. Ligaments Suboptimally assessed by CT. Muscles and Tendons Deep fascial planes are preserved. No evidence for fluid collection or hematoma identified. Soft tissues There is diffuse subcutaneous edema with skin thickening of the lateral left thigh. There is also body wall edema. There is no foreign body, fluid collection or hematoma identified. Ascites is present. IMPRESSION: 1. No acute osseous abnormality. 2. Diffuse subcutaneous edema with skin thickening of the lateral left thigh. No evidence for fluid collection or hematoma. 3. Body wall edema. 4. Ascites. Electronically Signed   By: Ronney Asters M.D.   On: 01/04/2022 18:47   US  BREAST ASPIRATION RIGHT  Result Date: 01/01/2022 INDICATION: RIGHT BREAST MASTITIS, UNDERLYING ABSCESS, RECENT BIOPSY EXAM: Ultrasound right breast needle aspiration MEDICATIONS: 1% lidocaine local ANESTHESIA/SEDATION: Moderate (conscious) sedation was employed during this procedure. A total of Versed 2.0 mg and Fentanyl 100 mcg was administered intravenously by the radiology nurse. Total intra-service moderate Sedation Time: 17 minutes. The patient's level of consciousness and vital signs were monitored continuously by radiology nursing throughout the procedure under my direct supervision. COMPLICATIONS: None immediate. PROCEDURE: Informed written consent was obtained from the patient after a thorough discussion of the procedural risks, benefits and alternatives. All questions were addressed. Maximal Sterile Barrier Technique was utilized including caps, mask, sterile gowns, sterile gloves, sterile drape, hand hygiene and skin antiseptic. A timeout was performed prior to the initiation of the procedure. Previous imaging reviewed. Preliminary on performed. Complex  retroareolar hypoechoic fluid pocket was localized at the 6 o'clock position. Overlying skin marked. Under sterile conditions and local anesthesia, the Yueh sheath needle was advanced into the hypoechoic fluid collection with direct ultrasound guidance. Needle position confirmed with ultrasound. Images obtained for documentation. 8 cc purulent fluid aspirated. Sample sent for culture. Survey of the breast demonstrates diffuse edema. No additional large fluid pocket localized by ultrasound for additional aspiration or drainage. IMPRESSION: Ultrasound-guided right breast abscess needle aspiration. Electronically Signed   By: Jerilynn Mages.  Shick M.D.   On: 01/01/2022 15:59   US BREAST LTD UNI RIGHT INC AXILLA  Result Date: 12/31/2021 CLINICAL DATA:  RIGHT breast mass. Patient had ultrasound-guided core biopsy of mass in the retroareolar RIGHT breast performed  02/01/2020, showing benign concordant acute and chronic inflammation. Surgical consultation was recommended for discordant findings. More recently, patient presented to the ED secondary to complaints of worsening right breast pain and swelling. She was admitted for further workup. She underwent a punch biopsy of the right breast on 12/29/2021; results are pending. Per discussion with the technologist, patient has active drainage of purulent fluid from the MEDIAL aspect of the RIGHT breast, discrete from the biopsy site. EXAM: ULTRASOUND OF THE RIGHT BREAST COMPARISON:  02/01/2020 FINDINGS: Targeted ultrasound is performed, showing marked skin thickening in the circumareolar and retroareolar regions of the RIGHT breast. A discrete hypoechoic collection is contiguous with the thickened skin in the immediate retroareolar region. This collection is 1.8 x 2.7 x 2.0 centimeters. Considerations include inflammatory changes or abscess. IMPRESSION: Suspect retroareolar abscess measuring 2.7 centimeters. RECOMMENDATION: Consider ultrasound-guided aspiration of right breast. Recommend annual screening mammography. Last bilateral mammogram was performed 01/20/2020. BI-RADS CATEGORY  2: Benign. These results will be called to the ordering clinician or representative by the Radiologist Assistant, and communication documented in the PACS or [employer]. Electronically Signed   By: Nolon Nations M.D.   On: 12/31/2021 12:23  CT Chest W Contrast  Result Date: 12/16/2021 CLINICAL DATA:  Chest wall mass EXAM: CT CHEST WITH CONTRAST TECHNIQUE: Multidetector CT imaging of the chest was performed during intravenous contrast administration. RADIATION DOSE REDUCTION: This exam was performed according to the departmental dose-optimization program which includes automated exposure control, adjustment of the mA and/or kV according to patient size and/or use of iterative reconstruction technique. CONTRAST:  84m OMNIPAQUE IOHEXOL  300 MG/ML  SOLN COMPARISON:  Mammogram 01/20/2020, breast ultrasound 02/01/2020, 01/20/2020, chest CT 06/05/2021. Chest x-ray 12/16/2021 FINDINGS: Cardiovascular: Nonaneurysmal aorta. Borderline cardiomegaly. No pericardial effusion. Mediastinum/Nodes: Midline trachea. No thyroid mass. Esophagus within normal limits. Multiple enlarged right axillary lymph nodes measuring up to 2.3 cm. Lungs/Pleura: No acute airspace disease, pleural effusion, or pneumothorax. Mild subpleural reticulation within the right anterior upper lobe. Upper Abdomen: Small volume ascites adjacent to the liver and spleen. Generalized mesenteric stranding. Partially visualized IVC filter. 2 cm hyperenhancing area in the central liver, series 2, image 154. This appears present on 06/05/2021 chest CT with suggestion of peripheral enhancement as may be seen with hemangioma. Musculoskeletal: No acute osseous abnormality. Marked diffuse skin thickening and generalized edema and stranding throughout the entirety of the right breast. No discrete rim enhancing collection by CT. The nipple areola complex is poorly defined but suspect that the nipple is retracted. There is generalized edema throughout the subcutaneous soft tissues consistent with anasarca. IMPRESSION: 1. Marked diffuse skin thickening involving the right breast with generalized edema and soft tissue stranding. Poorly defined nipple areola complex but suspect that the nipple is retracted, correlate  with direct inspection. Primary differential considerations include inflammatory breast cancer versus mastitis. Surgical consultation is recommended. There are multiple enlarged right axillary lymph nodes. 2. There is considerable generalized subcutaneous edema diffusely consistent with anasarca. There is small volume ascites within the upper abdomen. 3. Hyperenhancing lesion within the central liver, potential hemangioma. When the patient is clinically stable and able to follow directions and  hold their breath (preferably as an outpatient) further evaluation with dedicated abdominal MRI should be considered. Electronically Signed   By: Donavan Foil M.D.   On: 12/16/2021 16:13   DG Chest 2 View  Result Date: 12/16/2021 CLINICAL DATA:  cough EXAM: CHEST - 2 VIEW COMPARISON:  09/14/2018 FINDINGS: Cardiac silhouette enlarged. No evidence of pneumothorax or pleural effusion. No evidence of pulmonary edema or pneumonia. There are thoracic degenerative changes IMPRESSION: Enlarged cardiac silhouette.  No focal consolidation Electronically Signed   By: Sammie Bench M.D.   On: 12/16/2021 12:01    Orson Eva, DO  [facility]  If 7PM-7AM, please contact night-coverage [email or web address] Password [email or web address] 01/08/2022, 4:47 PM   LOS: 11 days

## 2022-01-09 DIAGNOSIS — Z86711 Personal history of pulmonary embolism: Secondary | ICD-10-CM | POA: Diagnosis not present

## 2022-01-09 DIAGNOSIS — I5021 Acute systolic (congestive) heart failure: Secondary | ICD-10-CM

## 2022-01-09 DIAGNOSIS — N61 Mastitis without abscess: Secondary | ICD-10-CM | POA: Diagnosis not present

## 2022-01-09 LAB — CBC
HCT: 29.1 % — ABNORMAL LOW (ref 36.0–46.0)
Hemoglobin: 8.3 g/dL — ABNORMAL LOW (ref 12.0–15.0)
MCH: 21.8 pg — ABNORMAL LOW (ref 26.0–34.0)
MCHC: 28.5 g/dL — ABNORMAL LOW (ref 30.0–36.0)
MCV: 76.4 fL — ABNORMAL LOW (ref 80.0–100.0)
Platelets: 109 10*3/uL — ABNORMAL LOW (ref 150–400)
RBC: 3.81 MIL/uL — ABNORMAL LOW (ref 3.87–5.11)
RDW: 28 % — ABNORMAL HIGH (ref 11.5–15.5)
WBC: 3.1 10*3/uL — ABNORMAL LOW (ref 4.0–10.5)
nRBC: 0 % (ref 0.0–0.2)

## 2022-01-09 LAB — BASIC METABOLIC PANEL
Anion gap: 8 (ref 5–15)
BUN: 16 mg/dL (ref 6–20)
CO2: 21 mmol/L — ABNORMAL LOW (ref 22–32)
Calcium: 8.4 mg/dL — ABNORMAL LOW (ref 8.9–10.3)
Chloride: 104 mmol/L (ref 98–111)
Creatinine, Ser: 1.09 mg/dL — ABNORMAL HIGH (ref 0.44–1.00)
GFR, Estimated: >60 mL/min (ref >60)
Glucose, Bld: 76 mg/dL (ref 70–99)
Potassium: 4 mmol/L (ref 3.5–5.1)
Sodium: 133 mmol/L — ABNORMAL LOW (ref 135–145)

## 2022-01-09 LAB — PROTIME-INR
INR: 2.5 — ABNORMAL HIGH (ref 0.8–1.2)
Prothrombin Time: 26.8 seconds — ABNORMAL HIGH (ref 11.4–15.2)

## 2022-01-09 LAB — MAGNESIUM: Magnesium: 2 mg/dL (ref 1.7–2.4)

## 2022-01-09 LAB — HEPARIN LEVEL (UNFRACTIONATED): Heparin Unfractionated: 0.76 IU/mL — ABNORMAL HIGH (ref 0.30–0.70)

## 2022-01-09 MED ORDER — WARFARIN SODIUM 7.5 MG PO TABS
7.5000 mg | ORAL_TABLET | Freq: Once | ORAL | Status: AC
  Administered 2022-01-09: 7.5 mg via ORAL
  Filled 2022-01-09: qty 1

## 2022-01-09 NOTE — Progress Notes (Signed)
PROGRESS NOTE  [name] [name] IRC:[id number] DOB: [birth year] DOA: 12/28/2021 PCP: Carrolyn Meiers, MD  Brief History:  46 y.o. female, known history of DVT PE 2015 IVC filter prior on Xarelto --- recurrence DVT/PE January 2023 status post thrombectomy on lifelong anticoagulation with Coumadin secondary to weight >250 pounds, OSA on CPAP, Known uterine fibroids, Smoker With recent admission to [facility] in sept 2023 due to syncope, felt to be vasovagal, combined CHF history (EF 30-35%).Patient presented to the ED secondary to complaints of worsening right breast pain and swelling, simply discharged on oral Augmentin without much improvement, she was admitted for further workup.  During the hospitalization, she was noted to be in decompensated HF.  She was started on IV lasix.  Cardiology was consulted.   Assessment/Plan:   Cellulitis of right breast with abscess/mastitis -With worsening right breast cellulitis despite being compliant with antibiotics, it is a concern for malignancy, especially with abnormal mammogram last year despite negative biopsy. -Continue with IV vancomycin given she failed oral Augmentin as an outpatient. -Pt going to [facility] 12/12 for IR to have US guided aspiration of right breast abscess.  Holding IV heparin for procedure.   -Medical suspicion for malignancy, so general surgery to perform punch biopsy during hospital stay, awaiting biopsy results  -ED discussed with Dr. Oneida Arenas, oncology, to be followed as an outpatient, as likely further imaging and liver biopsy needed in the outpatient setting.   - Breast US 12/31/21--US aspiration   - 12/9 biopsy>>acute on chronic inflammation -added cefepime for GNR>>Final culture = mixed anaerobes -02/01/2020 right breast bx>>neg malignancy -discussed with Dr. Okey Dupre -outpt follow up with med/onc -1219--d/c IV antibiotics--plan 7 days amox/clav + doxy    Acute on Chronic combined systolic and diastolic  CHF - Echocardiogram from 08/23/2021 showed EF dropped to 30 to 35% from 45-50 % back on Jun 05, 2021 -This echo also showed moderate to severe mitral regurg that was directed posteriorly, -Grade 3 diastolic dysfunction was noted (restrictive pattern) -This echo also showed severe dilatation of the left ventricular internal cavity with LVIDd..  With worsening pulmonary artery pressures (Compared to prior TTE on 05/2021, the EF has dropped to  30-35% from 45-50%, the LV is now severely enlarged, there is now at least moderate MR (previously mild) and the filling pressures are significantly elevated.  -remains clinically fluid overloaded -appreciate cardiology consult -continue IV lasix>.increased to 60 mg bid 12/28 -4600 cc out in last 24hr since increase lasix -hypotension has limited GDMT>>lower dose losartan, metoprolol, spiro -12/15 ReDS= 55 -12/17 ReDS = 48   DVT/PE (2015/2023) Chronic anticoagulation -INR was reversed after FFP given for biopsy procedure -sp thrombectomy (iliac) and IVC filter -Initially held warfarin in case further procedures needed, patient was placed on heparin bridge -Since her biopsy has been completed, she has been restarted on warfarin -INR therapeutic now -Discontinue further heparin    Anemia of iron deficiency -  Hemoglobin down to 7 -  transfused 2 units PRBC 12/11  -  Iron studies confirm iron deficiency (likely from menorrhagia) -  Hgb remains largely stable since transfusion -given nulecit x 2    obesity/OSA/pulmonary hypertension - continue CPAP nightly    Morbid Obesity -BMI 49.16 -lifestyle modification   Left Thigh Pain -CT thigh--neg for fluid collection or hematoma -now improved but still intermittent lateral discomfort -ambulates without difficulty       Family Communication:   spouse at bedside 12/18  Consultants: General surgery, cardiology   Code Status:  FULL    DVT Prophylaxis: Warfarin     Procedures: As Listed  in Progress Note Above   Antibiotics: Cefepime 12/13>>12/19 Vanc 12/9>>12/19 Metronidazole 12/15>>12/19 Amox/clav 12/19>> Doxy 12/19>>         Subjective: Notes improvement in swelling of her right breast.  Overall erythema is better.  She has noted intermittent drainage from the breast.  Feels that lower extremity edema also improving   Objective: Vitals:   01/08/22 2059 01/09/22 0111 01/09/22 0535 01/09/22 1625  BP: 96/64 98/65 102/70 92/61  Pulse: 68 78 79 65  Resp: '20 20 20 16  '$ Temp: 100.1 F (37.8 C) 99 F (37.2 C) 98.3 F (36.8 C) 98.7 F (37.1 C)  TempSrc: Oral Oral Oral Oral  SpO2: 100% 99% 100% 100%  Weight:      Height:        Intake/Output Summary (Last 24 hours) at 01/09/2022 2051 Last data filed at 01/09/2022 1800 Gross per 24 hour  Intake 2327.47 ml  Output 2210 ml  Net 117.47 ml   Weight change:  Exam:  General:  Pt is alert, follows commands appropriately, not in acute distress HEENT: No icterus, No thrush, No neck mass, [place]/AT Cardiovascular: RRR, S1/S2, no rubs, no gallops Respiratory: fine bibasilar crackles. Now wheeze Abdomen: Soft/+BS, non tender, non distended, no guarding Extremities: 2 +LE edema, No lymphangitis, No petechiae, No rashes, no synovitis   Data Reviewed: I have personally reviewed following labs and imaging studies Basic Metabolic Panel: Recent Labs  Lab 01/04/22 0359 01/06/22 0514 01/07/22 0433 01/08/22 0508 01/09/22 0507  NA 136 135 134* 133* 133*  K 3.7 3.5 3.7 4.0 4.0  CL 103 102 103 103 104  CO2 '26 26 23 22 '$ 21*  GLUCOSE 87 82 81 82 76  BUN '15 13 14 15 16  '$ CREATININE 0.97 0.93 0.93 1.02* 1.09*  CALCIUM 8.7* 8.7* 8.4* 8.4* 8.4*  MG 2.0 1.9 1.9 2.0 2.0   Liver Function Tests: No results for input(s): "AST", "ALT", "ALKPHOS", "BILITOT", "PROT", "ALBUMIN" in the last 168 hours. No results for input(s): "LIPASE", "AMYLASE" in the last 168 hours. No results for input(s): "AMMONIA" in the last 168  hours. Coagulation Profile: Recent Labs  Lab 01/05/22 0625 01/06/22 0514 01/07/22 0433 01/08/22 0508 01/09/22 0507  INR 1.6* 1.7* 2.0* 1.9* 2.5*   CBC: Recent Labs  Lab 01/03/22 0325 01/04/22 0359 01/06/22 0514 01/08/22 0508 01/09/22 0507  WBC 3.8* 4.3 4.2 4.3 3.1*  HGB 8.5* 8.2* 8.5* 8.5* 8.3*  HCT 30.5* 28.9* 30.7* 30.0* 29.1*  MCV 74.9* 74.1* 76.4* 75.9* 76.4*  PLT 241 193 159 136* 109*   Cardiac Enzymes: No results for input(s): "CKTOTAL", "CKMB", "CKMBINDEX", "TROPONINI" in the last 168 hours. BNP: Invalid input(s): "POCBNP" CBG: No results for input(s): "GLUCAP" in the last 168 hours. HbA1C: No results for input(s): "HGBA1C" in the last 72 hours. Urine analysis:    Component Value Date/Time   COLORURINE YELLOW 09/22/2021 1506   APPEARANCEUR HAZY (A) 09/22/2021 1506   APPEARANCEUR Cloudy (A) 07/21/2014 1326   LABSPEC 1.008 09/22/2021 1506   PHURINE 6.0 09/22/2021 1506   GLUCOSEU NEGATIVE 09/22/2021 1506   HGBUR NEGATIVE 09/22/2021 1506   BILIRUBINUR NEGATIVE 09/22/2021 1506   BILIRUBINUR Negative 07/21/2014 1326   KETONESUR NEGATIVE 09/22/2021 1506   PROTEINUR NEGATIVE 09/22/2021 1506   UROBILINOGEN 0.2 06/30/2014 0910   NITRITE NEGATIVE 09/22/2021 1506   LEUKOCYTESUR TRACE (A) 09/22/2021 1506   Sepsis Labs: @  LABRCNTIP(procalcitonin:4,lacticidven:4) ) Recent Results (from the past 240 hour(s))  Aerobic/Anaerobic Culture w Gram Stain (surgical/deep wound)     Status: None   Collection Time: 01/01/22 12:22 PM   Specimen: Abscess  Result Value Ref Range Status   Specimen Description ABSCESS  Final   Special Requests  BREAST RIGHT  Final   Gram Stain   Final    ABUNDANT WBC PRESENT, PREDOMINANTLY PMN MODERATE GRAM POSITIVE COCCI FEW GRAM NEGATIVE RODS Performed at [facility], [address] [place], [place] [address]    Culture   Final    MIXED ANAEROBIC FLORA PRESENT.  CALL LAB IF FURTHER IID REQUIRED.   Report Status 01/05/2022 FINAL   Final  Culture, blood (Routine X 2) w Reflex to ID Panel     Status: None (Preliminary result)   Collection Time: 01/08/22  5:54 PM   Specimen: BLOOD  Result Value Ref Range Status   Specimen Description BLOOD BLOOD RIGHT HAND  Final   Special Requests   Final    BOTTLES DRAWN AEROBIC AND ANAEROBIC Blood Culture adequate volume   Culture   Final    NO GROWTH < 24 HOURS Performed at [facility], [address], [place], [place] [address]    Report Status PENDING  Incomplete  Culture, blood (Routine X 2) w Reflex to ID Panel     Status: None (Preliminary result)   Collection Time: 01/08/22  5:57 PM   Specimen: BLOOD  Result Value Ref Range Status   Specimen Description BLOOD BLOOD LEFT HAND  Final   Special Requests   Final    BOTTLES DRAWN AEROBIC AND ANAEROBIC Blood Culture adequate volume   Culture   Final    NO GROWTH < 24 HOURS Performed at [facility], [address] [place], [place] [address]    Report Status PENDING  Incomplete     Scheduled Meds:  amoxicillin-clavulanate  1 tablet Oral Q12H   atorvastatin  20 mg Oral Daily   doxycycline  100 mg Oral Q12H   furosemide  60 mg Intravenous BID   losartan  12.5 mg Oral Daily   metoprolol succinate  25 mg Oral Daily   midodrine  2.5 mg Oral BID WC   potassium chloride  40 mEq Oral Daily   spironolactone  12.5 mg Oral Daily   Warfarin - Pharmacist Dosing Inpatient   Does not apply q1600   Continuous Infusions:    Procedures/Studies: CT FEMUR LEFT WO CONTRAST  Result Date: 01/04/2022 CLINICAL DATA:  Acute left thigh pain. Evaluate for hematoma. On IV heparin. EXAM: CT OF THE LOWER LEFT EXTREMITY WITHOUT CONTRAST TECHNIQUE: Multidetector CT imaging of the lower left extremity was performed according to the standard protocol. RADIATION DOSE REDUCTION: This exam was performed according to the departmental dose-optimization program which includes automated exposure control, adjustment of the mA and/or kV according to  patient size and/or use of iterative reconstruction technique. COMPARISON:  None Available. FINDINGS: Bones/Joint/Cartilage There is no acute fracture or focal osseous lesion. There is no dislocation. Joint spaces are well maintained. There is no joint effusion identified. Ligaments Suboptimally assessed by CT. Muscles and Tendons Deep fascial planes are preserved. No evidence for fluid collection or hematoma identified. Soft tissues There is diffuse subcutaneous edema with skin thickening of the lateral left thigh. There is also body wall edema. There is no foreign body, fluid collection or hematoma identified. Ascites is present. IMPRESSION: 1. No acute osseous abnormality. 2. Diffuse subcutaneous edema with skin  thickening of the lateral left thigh. No evidence for fluid collection or hematoma. 3. Body wall edema. 4. Ascites. Electronically Signed   By: Ronney Asters M.D.   On: 01/04/2022 18:47   US BREAST ASPIRATION RIGHT  Result Date: 01/01/2022 INDICATION: RIGHT BREAST MASTITIS, UNDERLYING ABSCESS, RECENT BIOPSY EXAM: Ultrasound right breast needle aspiration MEDICATIONS: 1% lidocaine local ANESTHESIA/SEDATION: Moderate (conscious) sedation was employed during this procedure. A total of Versed 2.0 mg and Fentanyl 100 mcg was administered intravenously by the radiology nurse. Total intra-service moderate Sedation Time: 17 minutes. The patient's level of consciousness and vital signs were monitored continuously by radiology nursing throughout the procedure under my direct supervision. COMPLICATIONS: None immediate. PROCEDURE: Informed written consent was obtained from the patient after a thorough discussion of the procedural risks, benefits and alternatives. All questions were addressed. Maximal Sterile Barrier Technique was utilized including caps, mask, sterile gowns, sterile gloves, sterile drape, hand hygiene and skin antiseptic. A timeout was performed prior to the initiation of the procedure. Previous  imaging reviewed. Preliminary on performed. Complex retroareolar hypoechoic fluid pocket was localized at the 6 o'clock position. Overlying skin marked. Under sterile conditions and local anesthesia, the Yueh sheath needle was advanced into the hypoechoic fluid collection with direct ultrasound guidance. Needle position confirmed with ultrasound. Images obtained for documentation. 8 cc purulent fluid aspirated. Sample sent for culture. Survey of the breast demonstrates diffuse edema. No additional large fluid pocket localized by ultrasound for additional aspiration or drainage. IMPRESSION: Ultrasound-guided right breast abscess needle aspiration. Electronically Signed   By: Jerilynn Mages.  Shick M.D.   On: 01/01/2022 15:59   US BREAST LTD UNI RIGHT INC AXILLA  Result Date: 12/31/2021 CLINICAL DATA:  RIGHT breast mass. Patient had ultrasound-guided core biopsy of mass in the retroareolar RIGHT breast performed 02/01/2020, showing benign concordant acute and chronic inflammation. Surgical consultation was recommended for discordant findings. More recently, patient presented to the ED secondary to complaints of worsening right breast pain and swelling. She was admitted for further workup. She underwent a punch biopsy of the right breast on 12/29/2021; results are pending. Per discussion with the technologist, patient has active drainage of purulent fluid from the MEDIAL aspect of the RIGHT breast, discrete from the biopsy site. EXAM: ULTRASOUND OF THE RIGHT BREAST COMPARISON:  02/01/2020 FINDINGS: Targeted ultrasound is performed, showing marked skin thickening in the circumareolar and retroareolar regions of the RIGHT breast. A discrete hypoechoic collection is contiguous with the thickened skin in the immediate retroareolar region. This collection is 1.8 x 2.7 x 2.0 centimeters. Considerations include inflammatory changes or abscess. IMPRESSION: Suspect retroareolar abscess measuring 2.7 centimeters. RECOMMENDATION:  Consider ultrasound-guided aspiration of right breast. Recommend annual screening mammography. Last bilateral mammogram was performed 01/20/2020. BI-RADS CATEGORY  2: Benign. These results will be called to the ordering clinician or representative by the Radiologist Assistant, and communication documented in the PACS or [employer]. Electronically Signed   By: Nolon Nations M.D.   On: 12/31/2021 12:23  CT Chest W Contrast  Result Date: 12/16/2021 CLINICAL DATA:  Chest wall mass EXAM: CT CHEST WITH CONTRAST TECHNIQUE: Multidetector CT imaging of the chest was performed during intravenous contrast administration. RADIATION DOSE REDUCTION: This exam was performed according to the departmental dose-optimization program which includes automated exposure control, adjustment of the mA and/or kV according to patient size and/or use of iterative reconstruction technique. CONTRAST:  42m OMNIPAQUE IOHEXOL 300 MG/ML  SOLN COMPARISON:  Mammogram 01/20/2020, breast ultrasound 02/01/2020, 01/20/2020, chest CT 06/05/2021. Chest x-ray 12/16/2021  FINDINGS: Cardiovascular: Nonaneurysmal aorta. Borderline cardiomegaly. No pericardial effusion. Mediastinum/Nodes: Midline trachea. No thyroid mass. Esophagus within normal limits. Multiple enlarged right axillary lymph nodes measuring up to 2.3 cm. Lungs/Pleura: No acute airspace disease, pleural effusion, or pneumothorax. Mild subpleural reticulation within the right anterior upper lobe. Upper Abdomen: Small volume ascites adjacent to the liver and spleen. Generalized mesenteric stranding. Partially visualized IVC filter. 2 cm hyperenhancing area in the central liver, series 2, image 154. This appears present on 06/05/2021 chest CT with suggestion of peripheral enhancement as may be seen with hemangioma. Musculoskeletal: No acute osseous abnormality. Marked diffuse skin thickening and generalized edema and stranding throughout the entirety of the right breast. No discrete  rim enhancing collection by CT. The nipple areola complex is poorly defined but suspect that the nipple is retracted. There is generalized edema throughout the subcutaneous soft tissues consistent with anasarca. IMPRESSION: 1. Marked diffuse skin thickening involving the right breast with generalized edema and soft tissue stranding. Poorly defined nipple areola complex but suspect that the nipple is retracted, correlate with direct inspection. Primary differential considerations include inflammatory breast cancer versus mastitis. Surgical consultation is recommended. There are multiple enlarged right axillary lymph nodes. 2. There is considerable generalized subcutaneous edema diffusely consistent with anasarca. There is small volume ascites within the upper abdomen. 3. Hyperenhancing lesion within the central liver, potential hemangioma. When the patient is clinically stable and able to follow directions and hold their breath (preferably as an outpatient) further evaluation with dedicated abdominal MRI should be considered. Electronically Signed   By: Donavan Foil M.D.   On: 12/16/2021 16:13   DG Chest 2 View  Result Date: 12/16/2021 CLINICAL DATA:  cough EXAM: CHEST - 2 VIEW COMPARISON:  09/14/2018 FINDINGS: Cardiac silhouette enlarged. No evidence of pneumothorax or pleural effusion. No evidence of pulmonary edema or pneumonia. There are thoracic degenerative changes IMPRESSION: Enlarged cardiac silhouette.  No focal consolidation Electronically Signed   By: Sammie Bench M.D.   On: 12/16/2021 12:01    Kathie Dike, MD  [facility]  If 7PM-7AM, please contact night-coverage [email or web address]  01/09/2022, 8:51 PM   LOS: 12 days

## 2022-01-09 NOTE — Progress Notes (Signed)
ANTICOAGULATION CONSULT NOTE -   Pharmacy Consult for heparin Indication: pulmonary embolus  Allergies  Allergen Reactions   Sulfa Antibiotics Itching, Swelling and Rash    Lip swelling    Patient Measurements: Height: '5\' 11"'$  (180.3 cm) Weight: (!) 159.9 kg (352 lb 8 oz) IBW/kg (Calculated) : 70.8 Heparin Dosing Weight: 107kg  Vital Signs: Temp: 98.3 F (36.8 C) (12/20 0535) Temp Source: Oral (12/20 0535) BP: 102/70 (12/20 0535) Pulse Rate: 79 (12/20 0535)  Labs: Recent Labs    01/07/22 0433 01/08/22 0508 01/09/22 0507  HGB  --  8.5* 8.3*  HCT  --  30.0* 29.1*  PLT  --  136* 109*  LABPROT 22.6* 21.5* 26.8*  INR 2.0* 1.9* 2.5*  HEPARINUNFRC 0.55 0.72* 0.76*  CREATININE 0.93 1.02* 1.09*     Estimated Creatinine Clearance: 109.5 mL/min (A) (by C-G formula based on SCr of 1.09 mg/dL (H)).   Medical History: Past Medical History:  Diagnosis Date   Abdominal cramps 08/30/2015   Anemia    Blood transfusion without reported diagnosis    CAD (coronary artery disease)    Chronic combined systolic and diastolic CHF (congestive heart failure) (HCC)    Closed left ankle fracture    August 30 2012   Clotting disorder [facility])    DVT (deep venous thrombosis) (HCC)    L leg   Fibroid tumor    Hidradenitis    High blood pressure    Hypertension    Mitral regurgitation    NICM (nonischemic cardiomyopathy) (HCC)    NSVT (nonsustained ventricular tachycardia) (HCC)    Obesity    OSA on CPAP    Oxygen deficiency    Pregnancy    09/20/14 17 weeks   Pulmonary embolism ([place]) 04/2013   Sleep apnea    Tobacco use disorder     Medications:  Medications Prior to Admission  Medication Sig Dispense Refill Last Dose   albuterol (VENTOLIN HFA) 108 (90 Base) MCG/ACT inhaler Inhale 2 puffs into the lungs every 2 (two) hours as needed for wheezing or shortness of breath. 18 g 11 12/28/2021   atorvastatin (LIPITOR) 20 MG tablet Take 1 tablet (20 mg total) by mouth daily. (Patient  taking differently: Take 20 mg by mouth in the morning.) 90 tablet 3 12/28/2021   furosemide (LASIX) 80 MG tablet Take 1 tablet (80 mg total) by mouth daily. (Patient taking differently: Take 80 mg by mouth in the morning.) 30 tablet 3 12/27/2021   losartan (COZAAR) 25 MG tablet Take 1 tablet (25 mg total) by mouth daily. (Patient taking differently: Take 25 mg by mouth in the morning.) 90 tablet 3 12/28/2021   Magnesium Oxide 400 MG CAPS Take 1 capsule (400 mg total) by mouth daily. (Patient taking differently: Take 400 mg by mouth in the morning.) 90 capsule 3 12/28/2021   metoprolol succinate (TOPROL-XL) 50 MG 24 hr tablet Take 1 tablet (50 mg total) by mouth daily. Take with or immediately following a meal. (Patient taking differently: Take 50 mg by mouth in the morning.) 90 tablet 3 12/28/2021 at 0730   potassium chloride (KLOR-CON) 10 MEQ tablet Take 1 tablet (10 mEq total) by mouth daily. (Patient taking differently: Take 10 mEq by mouth in the morning.) 90 tablet 3 12/28/2021   spironolactone (ALDACTONE) 25 MG tablet Take 0.5 tablets (12.5 mg total) by mouth daily. (Patient taking differently: Take 12.5 mg by mouth in the morning.) 30 tablet 3 12/28/2021   warfarin (COUMADIN) 10 MG tablet Take 5-10  mg by mouth See admin instructions. Take as directed in the evening : Monday take 10 mg and all other days take 5 mg.   12/28/2021   warfarin (COUMADIN) 5 MG tablet TAKE (1) OR (1) 1/2 TABLET BY MOUTH DAILY AS DIRECTED.   12/28/2021   amoxicillin-clavulanate (AUGMENTIN) 875-125 MG tablet Take 1 tablet by mouth every 12 (twelve) hours. (Patient not taking: Reported on 12/28/2021) 14 tablet 0 Completed Course   HYDROcodone-acetaminophen (NORCO/VICODIN) 5-325 MG tablet Take one tab po q 4 hrs prn pain (Patient not taking: Reported on 12/28/2021) 8 tablet 0 Completed Course   Scheduled:   amoxicillin-clavulanate  1 tablet Oral Q12H   atorvastatin  20 mg Oral Daily   doxycycline  100 mg Oral Q12H   furosemide  60  mg Intravenous BID   losartan  12.5 mg Oral Daily   metoprolol succinate  25 mg Oral Daily   midodrine  2.5 mg Oral BID WC   potassium chloride  40 mEq Oral Daily   spironolactone  12.5 mg Oral Daily   Warfarin - Pharmacist Dosing Inpatient   Does not apply q1600   Infusions:     Assessment:  Restarted warfarin 12/13 along with heparin bridge until INR >2.  INR is now 2.5 so will discontinue heparin.  Home dose listed as 10 mg every Monday and 5 mg ROW.   INR 1.9 > 2.5- therapeutic Patient requiring larger doses than prior to admission home regimen. Hgb 8.3 stable  Goal of Therapy:  INR 2-3 Monitor platelets by anticoagulation protocol: Yes   Plan:  Stop heparin infusion Warfarin 7.5 mg x 1 dose  Daily PT/INR Monitor for S/s of bleeding  Margot Ables, PharmD Clinical Pharmacist 01/09/2022 11:06 AM

## 2022-01-09 NOTE — Progress Notes (Signed)
Patient only reports only being DOE/SOB when walking around or going to bathroom. Right breast biopsy incision approximated no redness, inflammation or drainage. Had moderate serous drainage today X2 from nipple. Patient report swelling is down considerable amount since arrival on 12/8. Has only c/o pain X2 today and not running elevated temp.

## 2022-01-09 NOTE — Progress Notes (Signed)
Progress Note  Patient Name: [name] [name] Date of Encounter: 01/09/2022  Primary Cardiologist: Carlyle Dolly, MD  Subjective   No acute events overnight.  No symptoms.   Inpatient Medications    Scheduled Meds:  amoxicillin-clavulanate  1 tablet Oral Q12H   atorvastatin  20 mg Oral Daily   doxycycline  100 mg Oral Q12H   furosemide  60 mg Intravenous BID   losartan  12.5 mg Oral Daily   metoprolol succinate  25 mg Oral Daily   midodrine  2.5 mg Oral BID WC   potassium chloride  40 mEq Oral Daily   spironolactone  12.5 mg Oral Daily   warfarin  7.5 mg Oral ONCE-1600   Warfarin - Pharmacist Dosing Inpatient   Does not apply q1600   Continuous Infusions:   PRN Meds: acetaminophen **OR** acetaminophen, albuterol, oxyCODONE   Vital Signs    Vitals:   01/08/22 1901 01/08/22 2059 01/09/22 0111 01/09/22 0535  BP: '99/64 96/64 98/65 '$ 102/70  Pulse: 74 68 78 79  Resp: '18 20 20 20  '$ Temp: 98.3 F (36.8 C) 100.1 F (37.8 C) 99 F (37.2 C) 98.3 F (36.8 C)  TempSrc:  Oral Oral Oral  SpO2: 100% 100% 99% 100%  Weight:      Height:        Intake/Output Summary (Last 24 hours) at 01/09/2022 1142 Last data filed at 01/09/2022 0900 Gross per 24 hour  Intake 1837.32 ml  Output 1710 ml  Net 127.32 ml   Filed Weights   01/06/22 1500 01/07/22 0400 01/08/22 0743  Weight: (!) 158.6 kg (!) 160.6 kg (!) 159.9 kg    Telemetry     Personally reviewed, HR controlled and NSR  ECG    NSR and RBBB  Physical Exam   GEN: No acute distress.   Neck: Unable to examine JVD due to body habitus Cardiac: RRR, no murmur, rub, or gallop.  Respiratory: Nonlabored. Clear to auscultation bilaterally. GI: Soft, nontender, bowel sounds present. MS: 2+ pitting edema in b/l LE; No deformity. Neuro:  Nonfocal. Psych: Alert and oriented x 3. Normal affect.  Labs    Chemistry Recent Labs  Lab 01/07/22 0433 01/08/22 0508 01/09/22 0507  NA 134* 133* 133*  K 3.7 4.0 4.0  CL  103 103 104  CO2 23 22 21*  GLUCOSE 81 82 76  BUN '14 15 16  '$ CREATININE 0.93 1.02* 1.09*  CALCIUM 8.4* 8.4* 8.4*  GFRNONAA >60 >60 >60  ANIONGAP '8 8 8     '$ Hematology Recent Labs  Lab 01/06/22 0514 01/08/22 0508 01/09/22 0507  WBC 4.2 4.3 3.1*  RBC 4.02 3.95 3.81*  HGB 8.5* 8.5* 8.3*  HCT 30.7* 30.0* 29.1*  MCV 76.4* 75.9* 76.4*  MCH 21.1* 21.5* 21.8*  MCHC 27.7* 28.3* 28.5*  RDW 26.5* 27.3* 28.0*  PLT 159 136* 109*    Cardiac EnzymesNo results for input(s): "TROPONINIHS" in the last 720 hours.  BNP Recent Labs  Lab 01/03/22 0325  BNP 1,232.0*     DDimerNo results for input(s): "DDIMER" in the last 168 hours.   Radiology    No results found.  Cardiac Studies  Echo from 09/2021 LVEF 30 to 85% RV systolic function is mildly reduced LA moderately dilated RA mildly dilated Moderate to severe posteriorly directed MR TR is mild to moderate   Assessment & Plan   Patient is a 46 year old F known to have NICM LVEF 30 to 35% with moderate to severe MR, DVT/PE history in  2015 s/p IVC filter with recurrence of DVT/PE in 1/23 s/p thrombectomy on lifelong Coumadin, OSA on CPAP presented to the ER with worsening right breast pain and swelling, currently admitted to hospitalist team for the management of right breast cellulitis and ADHF.  # ADHF/NICM LVEF 30-35% with moderate to severe MR/NYHA class II/ACC AHA stage C, currently decompensated -Continue IV Lasix 60 mg twice daily -Continue metoprolol succinate 25 mg once daily -Continue losartan 12.5 mg once daily -Continue spironolactone 12.5 mg once daily -Continue midodrine for blood pressure support -Daily weights, 1.2 L of fluid restriction and daily ins and outs -She will benefit from outpatient advanced heart failure follow-up due to inability to titrate GDMT from soft blood pressures  # Valvular heart disease, moderate to severe functional MR -Echo will need to be repeated after GDMT optimization to evaluate for  MR severity and ICD candidacy, outpatient setting.  # DVT/PE history in 2015 s/p IVC filter with recurrence of DVT/PE in 1/23 s/p thrombectomy on lifelong Coumadin -Management per primary team  # Right breast cellulitis -Management per primary team  I have spent a total of 33 minutes with patient reviewing chart , telemetry, EKGs, labs and examining patient as well as establishing an assessment and plan that was discussed with the patient.  > 50% of time was spent in direct patient care.      Signed, Chalmers Guest, MD  01/09/2022, 11:42 AM

## 2022-01-10 DIAGNOSIS — I5021 Acute systolic (congestive) heart failure: Secondary | ICD-10-CM | POA: Diagnosis not present

## 2022-01-10 DIAGNOSIS — N61 Mastitis without abscess: Secondary | ICD-10-CM | POA: Diagnosis not present

## 2022-01-10 DIAGNOSIS — Z86711 Personal history of pulmonary embolism: Secondary | ICD-10-CM | POA: Diagnosis not present

## 2022-01-10 LAB — BASIC METABOLIC PANEL
Anion gap: 8 (ref 5–15)
BUN: 17 mg/dL (ref 6–20)
CO2: 23 mmol/L (ref 22–32)
Calcium: 8.6 mg/dL — ABNORMAL LOW (ref 8.9–10.3)
Chloride: 102 mmol/L (ref 98–111)
Creatinine, Ser: 1.1 mg/dL — ABNORMAL HIGH (ref 0.44–1.00)
GFR, Estimated: >60 mL/min (ref >60)
Glucose, Bld: 85 mg/dL (ref 70–99)
Potassium: 4.2 mmol/L (ref 3.5–5.1)
Sodium: 133 mmol/L — ABNORMAL LOW (ref 135–145)

## 2022-01-10 LAB — CBC
HCT: 28.5 % — ABNORMAL LOW (ref 36.0–46.0)
Hemoglobin: 8 g/dL — ABNORMAL LOW (ref 12.0–15.0)
MCH: 21.5 pg — ABNORMAL LOW (ref 26.0–34.0)
MCHC: 28.1 g/dL — ABNORMAL LOW (ref 30.0–36.0)
MCV: 76.6 fL — ABNORMAL LOW (ref 80.0–100.0)
Platelets: 122 10*3/uL — ABNORMAL LOW (ref 150–400)
RBC: 3.72 MIL/uL — ABNORMAL LOW (ref 3.87–5.11)
RDW: 28.7 % — ABNORMAL HIGH (ref 11.5–15.5)
WBC: 3.1 10*3/uL — ABNORMAL LOW (ref 4.0–10.5)
nRBC: 0 % (ref 0.0–0.2)

## 2022-01-10 LAB — PROTIME-INR
INR: 2.2 — ABNORMAL HIGH (ref 0.8–1.2)
Prothrombin Time: 23.9 seconds — ABNORMAL HIGH (ref 11.4–15.2)

## 2022-01-10 MED ORDER — WARFARIN SODIUM 5 MG PO TABS
10.0000 mg | ORAL_TABLET | Freq: Once | ORAL | Status: AC
  Administered 2022-01-10: 10 mg via ORAL
  Filled 2022-01-10: qty 2

## 2022-01-10 MED ORDER — FUROSEMIDE 10 MG/ML IJ SOLN
60.0000 mg | Freq: Three times a day (TID) | INTRAMUSCULAR | Status: DC
  Administered 2022-01-10 – 2022-01-11 (×3): 60 mg via INTRAVENOUS
  Filled 2022-01-10 (×3): qty 6

## 2022-01-10 NOTE — Progress Notes (Signed)
PROGRESS NOTE  [name] [name] JSH:[id number] DOB: 13-Oct-1975 DOA: 12/28/2021 PCP: Carrolyn Meiers, MD  Brief History:  46 y.o. female, known history of DVT PE 2015 IVC filter prior on Xarelto --- recurrence DVT/PE January 2023 status post thrombectomy on lifelong anticoagulation with Coumadin secondary to weight >250 pounds, OSA on CPAP, Known uterine fibroids, Smoker With recent admission to [facility] in sept 2023 due to syncope, felt to be vasovagal, combined CHF history (EF 30-35%).Patient presented to the ED secondary to complaints of worsening right breast pain and swelling, simply discharged on oral Augmentin without much improvement, she was admitted for further workup.  During the hospitalization, she was noted to be in decompensated HF.  She was started on IV lasix.  Cardiology was consulted.   Assessment/Plan:   Cellulitis of right breast with abscess/mastitis -With worsening right breast cellulitis despite being compliant with antibiotics, it is a concern for malignancy, especially with abnormal mammogram last year despite negative biopsy. -Continue with IV vancomycin given she failed oral Augmentin as an outpatient. -Pt going to [facility] 12/12 for IR to have US guided aspiration of right breast abscess.  Holding IV heparin for procedure.   -Medical suspicion for malignancy, so general surgery to perform punch biopsy during hospital stay, awaiting biopsy results  -ED discussed with Dr. Oneida Arenas, oncology, to be followed as an outpatient, as likely further imaging and liver biopsy needed in the outpatient setting.   - Breast US 12/31/21--US aspiration   - 12/9 biopsy>>acute on chronic inflammation -added cefepime for GNR>>Final culture = mixed anaerobes -02/01/2020 right breast bx>>neg malignancy -discussed with Dr. Okey Dupre -outpt follow up with med/onc -1219--d/c IV antibiotics--plan 7 days amox/clav + doxy    Acute on Chronic combined systolic and diastolic  CHF - Echocardiogram from 08/23/2021 showed EF dropped to 30 to 35% from 45-50 % back on Jun 05, 2021 -This echo also showed moderate to severe mitral regurg that was directed posteriorly, -Grade 3 diastolic dysfunction was noted (restrictive pattern) -This echo also showed severe dilatation of the left ventricular internal cavity with LVIDd..  With worsening pulmonary artery pressures (Compared to prior TTE on 05/2021, the EF has dropped to  30-35% from 45-50%, the LV is now severely enlarged, there is now at least moderate MR (previously mild) and the filling pressures are significantly elevated.  -remains clinically fluid overloaded -appreciate cardiology consult -continue IV lasix>.increased to 60 mg bid 12/28 -2610cc urine output in last 24 hours -hypotension has limited GDMT>>lower dose losartan, metoprolol, spiro -12/15 ReDS= 55 -12/17 ReDS = 48   DVT/PE (2015/2023) Chronic anticoagulation -INR was reversed after FFP given for biopsy procedure -sp thrombectomy (iliac) and IVC filter -Initially held warfarin in case further procedures needed, patient was placed on heparin bridge -Since her biopsy has been completed, she has been restarted on warfarin -INR therapeutic now     Anemia of iron deficiency -  Hemoglobin down to 7 -  transfused 2 units PRBC 12/11  -  Iron studies confirm iron deficiency (likely from menorrhagia) -  Hgb remains largely stable since transfusion -given nulecit x 2    obesity/OSA/pulmonary hypertension - continue CPAP nightly    Morbid Obesity -BMI 49.16 -lifestyle modification   Left Thigh Pain -CT thigh--neg for fluid collection or hematoma -now improved but still intermittent lateral discomfort -ambulates without difficulty       Family Communication:   spouse at bedside 12/18   Consultants: General surgery,  cardiology   Code Status:  FULL    DVT Prophylaxis: Warfarin     Procedures: As Listed in Progress Note Above    Antibiotics: Cefepime 12/13>>12/19 Vanc 12/9>>12/19 Metronidazole 12/15>>12/19 Amox/clav 12/19>> Doxy 12/19>>         Subjective: She is able to ambulate.  Still has significant lower extremity edema and feels as though her legs/feet are still very tight.   Objective: Vitals:   01/09/22 2120 01/09/22 2200 01/10/22 0520 01/10/22 1300  BP: 102/72  103/61 122/79  Pulse: 81 85 71 85  Resp: (!) '24 18 16 20  '$ Temp: 99.3 F (37.4 C)  99.1 F (37.3 C) 98.2 F (36.8 C)  TempSrc: Oral   Oral  SpO2: 100% 96% 98% 100%  Weight:      Height:        Intake/Output Summary (Last 24 hours) at 01/10/2022 1916 Last data filed at 01/10/2022 1829 Gross per 24 hour  Intake 1500 ml  Output 3800 ml  Net -2300 ml   Weight change:  Exam:  General:  Pt is alert, follows commands appropriately, not in acute distress HEENT: No icterus, No thrush, No neck mass, [place]/AT Cardiovascular: RRR, S1/S2, no rubs, no gallops Respiratory: fine bibasilar crackles. Now wheeze Abdomen: Soft/+BS, non tender, non distended, no guarding Extremities: 2 +LE edema, No lymphangitis, No petechiae, No rashes, no synovitis   Data Reviewed: I have personally reviewed following labs and imaging studies Basic Metabolic Panel: Recent Labs  Lab 01/04/22 0359 01/06/22 0514 01/07/22 0433 01/08/22 0508 01/09/22 0507 01/10/22 0445  NA 136 135 134* 133* 133* 133*  K 3.7 3.5 3.7 4.0 4.0 4.2  CL 103 102 103 103 104 102  CO2 '26 26 23 22 '$ 21* 23  GLUCOSE 87 82 81 82 76 85  BUN '15 13 14 15 16 17  '$ CREATININE 0.97 0.93 0.93 1.02* 1.09* 1.10*  CALCIUM 8.7* 8.7* 8.4* 8.4* 8.4* 8.6*  MG 2.0 1.9 1.9 2.0 2.0  --    Liver Function Tests: No results for input(s): "AST", "ALT", "ALKPHOS", "BILITOT", "PROT", "ALBUMIN" in the last 168 hours. No results for input(s): "LIPASE", "AMYLASE" in the last 168 hours. No results for input(s): "AMMONIA" in the last 168 hours. Coagulation Profile: Recent Labs  Lab 01/06/22 0514  01/07/22 0433 01/08/22 0508 01/09/22 0507 01/10/22 0445  INR 1.7* 2.0* 1.9* 2.5* 2.2*   CBC: Recent Labs  Lab 01/04/22 0359 01/06/22 0514 01/08/22 0508 01/09/22 0507 01/10/22 0445  WBC 4.3 4.2 4.3 3.1* 3.1*  HGB 8.2* 8.5* 8.5* 8.3* 8.0*  HCT 28.9* 30.7* 30.0* 29.1* 28.5*  MCV 74.1* 76.4* 75.9* 76.4* 76.6*  PLT 193 159 136* 109* 122*   Cardiac Enzymes: No results for input(s): "CKTOTAL", "CKMB", "CKMBINDEX", "TROPONINI" in the last 168 hours. BNP: Invalid input(s): "POCBNP" CBG: No results for input(s): "GLUCAP" in the last 168 hours. HbA1C: No results for input(s): "HGBA1C" in the last 72 hours. Urine analysis:    Component Value Date/Time   COLORURINE YELLOW 09/22/2021 1506   APPEARANCEUR HAZY (A) 09/22/2021 1506   APPEARANCEUR Cloudy (A) 07/21/2014 1326   LABSPEC 1.008 09/22/2021 1506   PHURINE 6.0 09/22/2021 1506   GLUCOSEU NEGATIVE 09/22/2021 1506   HGBUR NEGATIVE 09/22/2021 1506   BILIRUBINUR NEGATIVE 09/22/2021 1506   BILIRUBINUR Negative 07/21/2014 1326   KETONESUR NEGATIVE 09/22/2021 1506   PROTEINUR NEGATIVE 09/22/2021 1506   UROBILINOGEN 0.2 06/30/2014 0910   NITRITE NEGATIVE 09/22/2021 1506   LEUKOCYTESUR TRACE (A) 09/22/2021 1506   Sepsis Labs: @  LABRCNTIP(procalcitonin:4,lacticidven:4) ) Recent Results (from the past 240 hour(s))  Aerobic/Anaerobic Culture w Gram Stain (surgical/deep wound)     Status: None   Collection Time: 01/01/22 12:22 PM   Specimen: Abscess  Result Value Ref Range Status   Specimen Description ABSCESS  Final   Special Requests  BREAST RIGHT  Final   Gram Stain   Final    ABUNDANT WBC PRESENT, PREDOMINANTLY PMN MODERATE GRAM POSITIVE COCCI FEW GRAM NEGATIVE RODS Performed at [facility], [address] [place], [place] [address]    Culture   Final    MIXED ANAEROBIC FLORA PRESENT.  CALL LAB IF FURTHER IID REQUIRED.   Report Status 01/05/2022 FINAL  Final  Culture, blood (Routine X 2) w Reflex to ID Panel      Status: None (Preliminary result)   Collection Time: 01/08/22  5:54 PM   Specimen: BLOOD  Result Value Ref Range Status   Specimen Description BLOOD BLOOD RIGHT HAND  Final   Special Requests   Final    BOTTLES DRAWN AEROBIC AND ANAEROBIC Blood Culture adequate volume   Culture   Final    NO GROWTH 2 DAYS Performed at [facility], [address] [place], [place] [address]    Report Status PENDING  Incomplete  Culture, blood (Routine X 2) w Reflex to ID Panel     Status: None (Preliminary result)   Collection Time: 01/08/22  5:57 PM   Specimen: BLOOD  Result Value Ref Range Status   Specimen Description BLOOD BLOOD LEFT HAND  Final   Special Requests   Final    BOTTLES DRAWN AEROBIC AND ANAEROBIC Blood Culture adequate volume   Culture   Final    NO GROWTH 2 DAYS Performed at [facility], [address] [place],  [address]    Report Status PENDING  Incomplete     Scheduled Meds:  amoxicillin-clavulanate  1 tablet Oral Q12H   atorvastatin  20 mg Oral Daily   doxycycline  100 mg Oral Q12H   furosemide  60 mg Intravenous TID AC   losartan  12.5 mg Oral Daily   metoprolol succinate  25 mg Oral Daily   midodrine  2.5 mg Oral BID WC   potassium chloride  40 mEq Oral Daily   spironolactone  12.5 mg Oral Daily   Warfarin - Pharmacist Dosing Inpatient   Does not apply q1600   Continuous Infusions:    Procedures/Studies: CT FEMUR LEFT WO CONTRAST  Result Date: 01/04/2022 CLINICAL DATA:  Acute left thigh pain. Evaluate for hematoma. On IV heparin. EXAM: CT OF THE LOWER LEFT EXTREMITY WITHOUT CONTRAST TECHNIQUE: Multidetector CT imaging of the lower left extremity was performed according to the standard protocol. RADIATION DOSE REDUCTION: This exam was performed according to the departmental dose-optimization program which includes automated exposure control, adjustment of the mA and/or kV according to patient size and/or use of iterative reconstruction technique.  COMPARISON:  None Available. FINDINGS: Bones/Joint/Cartilage There is no acute fracture or focal osseous lesion. There is no dislocation. Joint spaces are well maintained. There is no joint effusion identified. Ligaments Suboptimally assessed by CT. Muscles and Tendons Deep fascial planes are preserved. No evidence for fluid collection or hematoma identified. Soft tissues There is diffuse subcutaneous edema with skin thickening of the lateral left thigh. There is also body wall edema. There is no foreign body, fluid collection or hematoma identified. Ascites is present. IMPRESSION: 1. No acute osseous abnormality. 2. Diffuse subcutaneous edema with skin thickening  of the lateral left thigh. No evidence for fluid collection or hematoma. 3. Body wall edema. 4. Ascites. Electronically Signed   By: Ronney Asters M.D.   On: 01/04/2022 18:47   US BREAST ASPIRATION RIGHT  Result Date: 01/01/2022 INDICATION: RIGHT BREAST MASTITIS, UNDERLYING ABSCESS, RECENT BIOPSY EXAM: Ultrasound right breast needle aspiration MEDICATIONS: 1% lidocaine local ANESTHESIA/SEDATION: Moderate (conscious) sedation was employed during this procedure. A total of Versed 2.0 mg and Fentanyl 100 mcg was administered intravenously by the radiology nurse. Total intra-service moderate Sedation Time: 17 minutes. The patient's level of consciousness and vital signs were monitored continuously by radiology nursing throughout the procedure under my direct supervision. COMPLICATIONS: None immediate. PROCEDURE: Informed written consent was obtained from the patient after a thorough discussion of the procedural risks, benefits and alternatives. All questions were addressed. Maximal Sterile Barrier Technique was utilized including caps, mask, sterile gowns, sterile gloves, sterile drape, hand hygiene and skin antiseptic. A timeout was performed prior to the initiation of the procedure. Previous imaging reviewed. Preliminary on performed. Complex  retroareolar hypoechoic fluid pocket was localized at the 6 o'clock position. Overlying skin marked. Under sterile conditions and local anesthesia, the Yueh sheath needle was advanced into the hypoechoic fluid collection with direct ultrasound guidance. Needle position confirmed with ultrasound. Images obtained for documentation. 8 cc purulent fluid aspirated. Sample sent for culture. Survey of the breast demonstrates diffuse edema. No additional large fluid pocket localized by ultrasound for additional aspiration or drainage. IMPRESSION: Ultrasound-guided right breast abscess needle aspiration. Electronically Signed   By: Jerilynn Mages.  Shick M.D.   On: 01/01/2022 15:59   US BREAST LTD UNI RIGHT INC AXILLA  Result Date: 12/31/2021 CLINICAL DATA:  RIGHT breast mass. Patient had ultrasound-guided core biopsy of mass in the retroareolar RIGHT breast performed 02/01/2020, showing benign concordant acute and chronic inflammation. Surgical consultation was recommended for discordant findings. More recently, patient presented to the ED secondary to complaints of worsening right breast pain and swelling. She was admitted for further workup. She underwent a punch biopsy of the right breast on 12/29/2021; results are pending. Per discussion with the technologist, patient has active drainage of purulent fluid from the MEDIAL aspect of the RIGHT breast, discrete from the biopsy site. EXAM: ULTRASOUND OF THE RIGHT BREAST COMPARISON:  02/01/2020 FINDINGS: Targeted ultrasound is performed, showing marked skin thickening in the circumareolar and retroareolar regions of the RIGHT breast. A discrete hypoechoic collection is contiguous with the thickened skin in the immediate retroareolar region. This collection is 1.8 x 2.7 x 2.0 centimeters. Considerations include inflammatory changes or abscess. IMPRESSION: Suspect retroareolar abscess measuring 2.7 centimeters. RECOMMENDATION: Consider ultrasound-guided aspiration of right breast.  Recommend annual screening mammography. Last bilateral mammogram was performed 01/20/2020. BI-RADS CATEGORY  2: Benign. These results will be called to the ordering clinician or representative by the Radiologist Assistant, and communication documented in the PACS or [employer]. Electronically Signed   By: Nolon Nations M.D.   On: 12/31/2021 12:23  CT Chest W Contrast  Result Date: 12/16/2021 CLINICAL DATA:  Chest wall mass EXAM: CT CHEST WITH CONTRAST TECHNIQUE: Multidetector CT imaging of the chest was performed during intravenous contrast administration. RADIATION DOSE REDUCTION: This exam was performed according to the departmental dose-optimization program which includes automated exposure control, adjustment of the mA and/or kV according to patient size and/or use of iterative reconstruction technique. CONTRAST:  56m OMNIPAQUE IOHEXOL 300 MG/ML  SOLN COMPARISON:  Mammogram 01/20/2020, breast ultrasound 02/01/2020, 01/20/2020, chest CT 06/05/2021. Chest x-ray 12/16/2021 FINDINGS:  Cardiovascular: Nonaneurysmal aorta. Borderline cardiomegaly. No pericardial effusion. Mediastinum/Nodes: Midline trachea. No thyroid mass. Esophagus within normal limits. Multiple enlarged right axillary lymph nodes measuring up to 2.3 cm. Lungs/Pleura: No acute airspace disease, pleural effusion, or pneumothorax. Mild subpleural reticulation within the right anterior upper lobe. Upper Abdomen: Small volume ascites adjacent to the liver and spleen. Generalized mesenteric stranding. Partially visualized IVC filter. 2 cm hyperenhancing area in the central liver, series 2, image 154. This appears present on 06/05/2021 chest CT with suggestion of peripheral enhancement as may be seen with hemangioma. Musculoskeletal: No acute osseous abnormality. Marked diffuse skin thickening and generalized edema and stranding throughout the entirety of the right breast. No discrete rim enhancing collection by CT. The nipple areola complex  is poorly defined but suspect that the nipple is retracted. There is generalized edema throughout the subcutaneous soft tissues consistent with anasarca. IMPRESSION: 1. Marked diffuse skin thickening involving the right breast with generalized edema and soft tissue stranding. Poorly defined nipple areola complex but suspect that the nipple is retracted, correlate with direct inspection. Primary differential considerations include inflammatory breast cancer versus mastitis. Surgical consultation is recommended. There are multiple enlarged right axillary lymph nodes. 2. There is considerable generalized subcutaneous edema diffusely consistent with anasarca. There is small volume ascites within the upper abdomen. 3. Hyperenhancing lesion within the central liver, potential hemangioma. When the patient is clinically stable and able to follow directions and hold their breath (preferably as an outpatient) further evaluation with dedicated abdominal MRI should be considered. Electronically Signed   By: Donavan Foil M.D.   On: 12/16/2021 16:13   DG Chest 2 View  Result Date: 12/16/2021 CLINICAL DATA:  cough EXAM: CHEST - 2 VIEW COMPARISON:  09/14/2018 FINDINGS: Cardiac silhouette enlarged. No evidence of pneumothorax or pleural effusion. No evidence of pulmonary edema or pneumonia. There are thoracic degenerative changes IMPRESSION: Enlarged cardiac silhouette.  No focal consolidation Electronically Signed   By: Sammie Bench M.D.   On: 12/16/2021 12:01    Kathie Dike, MD  [facility]  If 7PM-7AM, please contact night-coverage [email or web address]  01/10/2022, 7:16 PM   LOS: 13 days

## 2022-01-10 NOTE — Progress Notes (Signed)
No acute events with patient this shift. Patient pain controlled with po medication.

## 2022-01-10 NOTE — Progress Notes (Signed)
Progress Note  Patient Name: [name] [name] Date of Encounter: 01/10/2022  Primary Cardiologist: Carlyle Dolly, MD  Subjective   No acute events overnight. No symptoms. She is going to walk in the hallway sometime this afternoon with the nurse/PT.  Inpatient Medications    Scheduled Meds:  amoxicillin-clavulanate  1 tablet Oral Q12H   atorvastatin  20 mg Oral Daily   doxycycline  100 mg Oral Q12H   furosemide  60 mg Intravenous BID   losartan  12.5 mg Oral Daily   metoprolol succinate  25 mg Oral Daily   midodrine  2.5 mg Oral BID WC   potassium chloride  40 mEq Oral Daily   spironolactone  12.5 mg Oral Daily   warfarin  10 mg Oral ONCE-1600   Warfarin - Pharmacist Dosing Inpatient   Does not apply q1600   Continuous Infusions:   PRN Meds: acetaminophen **OR** acetaminophen, albuterol, oxyCODONE   Vital Signs    Vitals:   01/09/22 1625 01/09/22 2120 01/09/22 2200 01/10/22 0520  BP: 92/61 102/72  103/61  Pulse: 65 81 85 71  Resp: 16 (!) '24 18 16  '$ Temp: 98.7 F (37.1 C) 99.3 F (37.4 C)  99.1 F (37.3 C)  TempSrc: Oral Oral    SpO2: 100% 100% 96% 98%  Weight:      Height:        Intake/Output Summary (Last 24 hours) at 01/10/2022 1105 Last data filed at 01/10/2022 0900 Gross per 24 hour  Intake 1750.15 ml  Output 2900 ml  Net -1149.85 ml   Filed Weights   01/06/22 1500 01/07/22 0400 01/08/22 0743  Weight: (!) 158.6 kg (!) 160.6 kg (!) 159.9 kg    Telemetry     Personally reviewed, HR controlled and NSR  ECG    NSR and RBBB  Physical Exam   GEN: No acute distress.   Neck: Unable to examine JVD due to body habitus Cardiac: RRR, no murmur, rub, or gallop.  Respiratory: Nonlabored. Clear to auscultation bilaterally. GI: Soft, nontender, bowel sounds present. MS: 1+ pitting edema in b/l LE; No deformity. Neuro:  Nonfocal. Psych: Alert and oriented x 3. Normal affect.  Labs    Chemistry Recent Labs  Lab 01/08/22 0508 01/09/22 0507  01/10/22 0445  NA 133* 133* 133*  K 4.0 4.0 4.2  CL 103 104 102  CO2 22 21* 23  GLUCOSE 82 76 85  BUN '15 16 17  '$ CREATININE 1.02* 1.09* 1.10*  CALCIUM 8.4* 8.4* 8.6*  GFRNONAA >60 >60 >60  ANIONGAP '8 8 8     '$ Hematology Recent Labs  Lab 01/08/22 0508 01/09/22 0507 01/10/22 0445  WBC 4.3 3.1* 3.1*  RBC 3.95 3.81* 3.72*  HGB 8.5* 8.3* 8.0*  HCT 30.0* 29.1* 28.5*  MCV 75.9* 76.4* 76.6*  MCH 21.5* 21.8* 21.5*  MCHC 28.3* 28.5* 28.1*  RDW 27.3* 28.0* 28.7*  PLT 136* 109* 122*    Cardiac EnzymesNo results for input(s): "TROPONINIHS" in the last 720 hours.  BNP No results for input(s): "BNP", "PROBNP" in the last 168 hours.    DDimerNo results for input(s): "DDIMER" in the last 168 hours.   Radiology    No results found.  Cardiac Studies  Echo from 09/2021 LVEF 30 to 70% RV systolic function is mildly reduced LA moderately dilated RA mildly dilated Moderate to severe posteriorly directed MR TR is mild to moderate   Assessment & Plan   Patient is a 46 year old F known to have NICM LVEF  30 to 35% with moderate to severe MR, DVT/PE history in 2015 s/p IVC filter with recurrence of DVT/PE in 1/23 s/p thrombectomy on lifelong Coumadin, OSA on CPAP presented to the ER with worsening right breast pain and swelling, currently admitted to hospitalist team for the management of right breast cellulitis and ADHF.  # ADHF/NICM LVEF 30-35% with moderate to severe MR/NYHA class II/ACC AHA stage C, currently mildly decompensated -Switch IV Lasix 60 mg from twice daily to 3 times daily -Continue metoprolol succinate 25 mg once daily -Continue losartan 12.5 mg once daily -Continue spironolactone 12.5 mg once daily -Continue midodrine for blood pressure support -Daily weights, 1.2 L of fluid restriction and daily ins and outs -She will benefit from outpatient advanced heart failure follow-up due to inability to titrate GDMT from soft blood pressures.  # Valvular heart disease,  moderate to severe functional MR -Echo will need to be repeated after GDMT optimization to evaluate for MR severity and ICD candidacy, outpatient setting.  # DVT/PE history in 2015 s/p IVC filter with recurrence of DVT/PE in 1/23 s/p thrombectomy on lifelong Coumadin -Management per primary team  # Right breast cellulitis -Management per primary team  I have spent a total of 34 minutes with patient reviewing chart , telemetry, EKGs, labs and examining patient as well as establishing an assessment and plan that was discussed with the patient.  > 50% of time was spent in direct patient care.      Signed, Chalmers Guest, MD  01/10/2022, 11:05 AM

## 2022-01-10 NOTE — Progress Notes (Signed)
ANTICOAGULATION CONSULT NOTE -   Pharmacy Consult for heparin Indication: pulmonary embolus  Allergies  Allergen Reactions   Sulfa Antibiotics Itching, Swelling and Rash    Lip swelling    Patient Measurements: Height: '5\' 11"'$  (180.3 cm) Weight: (!) 159.9 kg (352 lb 8 oz) IBW/kg (Calculated) : 70.8 Heparin Dosing Weight: 107kg  Vital Signs: Temp: 99.1 F (37.3 C) (12/21 0520) Temp Source: Oral (12/20 2120) BP: 103/61 (12/21 0520) Pulse Rate: 71 (12/21 0520)  Labs: Recent Labs    01/08/22 0508 01/09/22 0507 01/10/22 0445  HGB 8.5* 8.3* 8.0*  HCT 30.0* 29.1* 28.5*  PLT 136* 109* 122*  LABPROT 21.5* 26.8* 23.9*  INR 1.9* 2.5* 2.2*  HEPARINUNFRC 0.72* 0.76*  --   CREATININE 1.02* 1.09* 1.10*     Estimated Creatinine Clearance: 108.5 mL/min (A) (by C-G formula based on SCr of 1.1 mg/dL (H)).   Medical History: Past Medical History:  Diagnosis Date   Abdominal cramps 08/30/2015   Anemia    Blood transfusion without reported diagnosis    CAD (coronary artery disease)    Chronic combined systolic and diastolic CHF (congestive heart failure) (HCC)    Closed left ankle fracture    August 30 2012   Clotting disorder [facility])    DVT (deep venous thrombosis) (HCC)    L leg   Fibroid tumor    Hidradenitis    High blood pressure    Hypertension    Mitral regurgitation    NICM (nonischemic cardiomyopathy) (HCC)    NSVT (nonsustained ventricular tachycardia) (HCC)    Obesity    OSA on CPAP    Oxygen deficiency    Pregnancy    09/20/14 17 weeks   Pulmonary embolism ([place]) 04/2013   Sleep apnea    Tobacco use disorder     Medications:  Medications Prior to Admission  Medication Sig Dispense Refill Last Dose   albuterol (VENTOLIN HFA) 108 (90 Base) MCG/ACT inhaler Inhale 2 puffs into the lungs every 2 (two) hours as needed for wheezing or shortness of breath. 18 g 11 12/28/2021   atorvastatin (LIPITOR) 20 MG tablet Take 1 tablet (20 mg total) by mouth daily. (Patient  taking differently: Take 20 mg by mouth in the morning.) 90 tablet 3 12/28/2021   furosemide (LASIX) 80 MG tablet Take 1 tablet (80 mg total) by mouth daily. (Patient taking differently: Take 80 mg by mouth in the morning.) 30 tablet 3 12/27/2021   losartan (COZAAR) 25 MG tablet Take 1 tablet (25 mg total) by mouth daily. (Patient taking differently: Take 25 mg by mouth in the morning.) 90 tablet 3 12/28/2021   Magnesium Oxide 400 MG CAPS Take 1 capsule (400 mg total) by mouth daily. (Patient taking differently: Take 400 mg by mouth in the morning.) 90 capsule 3 12/28/2021   metoprolol succinate (TOPROL-XL) 50 MG 24 hr tablet Take 1 tablet (50 mg total) by mouth daily. Take with or immediately following a meal. (Patient taking differently: Take 50 mg by mouth in the morning.) 90 tablet 3 12/28/2021 at 0730   potassium chloride (KLOR-CON) 10 MEQ tablet Take 1 tablet (10 mEq total) by mouth daily. (Patient taking differently: Take 10 mEq by mouth in the morning.) 90 tablet 3 12/28/2021   spironolactone (ALDACTONE) 25 MG tablet Take 0.5 tablets (12.5 mg total) by mouth daily. (Patient taking differently: Take 12.5 mg by mouth in the morning.) 30 tablet 3 12/28/2021   warfarin (COUMADIN) 10 MG tablet Take 5-10 mg by mouth See  admin instructions. Take as directed in the evening : Monday take 10 mg and all other days take 5 mg.   12/28/2021   warfarin (COUMADIN) 5 MG tablet TAKE (1) OR (1) 1/2 TABLET BY MOUTH DAILY AS DIRECTED.   12/28/2021   amoxicillin-clavulanate (AUGMENTIN) 875-125 MG tablet Take 1 tablet by mouth every 12 (twelve) hours. (Patient not taking: Reported on 12/28/2021) 14 tablet 0 Completed Course   HYDROcodone-acetaminophen (NORCO/VICODIN) 5-325 MG tablet Take one tab po q 4 hrs prn pain (Patient not taking: Reported on 12/28/2021) 8 tablet 0 Completed Course   Scheduled:   amoxicillin-clavulanate  1 tablet Oral Q12H   atorvastatin  20 mg Oral Daily   doxycycline  100 mg Oral Q12H   furosemide  60  mg Intravenous BID   losartan  12.5 mg Oral Daily   metoprolol succinate  25 mg Oral Daily   midodrine  2.5 mg Oral BID WC   potassium chloride  40 mEq Oral Daily   spironolactone  12.5 mg Oral Daily   Warfarin - Pharmacist Dosing Inpatient   Does not apply q1600   Infusions:     Assessment:  Restarted warfarin 12/13 along with heparin bridge until INR >2.  INR now therapeutic so will discontinue heparin.  Home dose listed as 10 mg every Monday and 5 mg ROW.   INR 1.9 > 2.5> 2.2- therapeutic Patient requiring larger doses than prior to admission home regimen. Hgb 8.0 stable  Goal of Therapy:  INR 2-3 Monitor platelets by anticoagulation protocol: Yes   Plan:  Warfarin 10 mg x 1 dose  Daily PT/INR Monitor for S/s of bleeding  Margot Ables, PharmD Clinical Pharmacist 01/10/2022 7:51 AM

## 2022-01-10 NOTE — Progress Notes (Signed)
[facility]  Patient seen and examined.  She is sitting in chair eating lunch.  She is doing well from a breast standpoint with mostly resolved pain, improving erythema and edema.  No fevers or chills.  Exam: Breast: Right breast with pitting edema, improving; erythema almost completely resolved, healed punch biopsy site with suture in place  Plan: -Sutures removed from punch biopsy site -Reiterated the pathology results demonstrating no cancer -Patient will need outpatient imaging to evaluate for underlying breast pathology -Follow up with me as needed  Graciella Freer, DO [facility] [address] [place], [place] [address] [phone] (office)

## 2022-01-11 DIAGNOSIS — G894 Chronic pain syndrome: Secondary | ICD-10-CM | POA: Diagnosis not present

## 2022-01-11 DIAGNOSIS — N61 Mastitis without abscess: Secondary | ICD-10-CM | POA: Diagnosis not present

## 2022-01-11 DIAGNOSIS — G4733 Obstructive sleep apnea (adult) (pediatric): Secondary | ICD-10-CM

## 2022-01-11 DIAGNOSIS — I5043 Acute on chronic combined systolic (congestive) and diastolic (congestive) heart failure: Secondary | ICD-10-CM | POA: Diagnosis not present

## 2022-01-11 LAB — CBC
HCT: 30.2 % — ABNORMAL LOW (ref 36.0–46.0)
Hemoglobin: 8.5 g/dL — ABNORMAL LOW (ref 12.0–15.0)
MCH: 21.9 pg — ABNORMAL LOW (ref 26.0–34.0)
MCHC: 28.1 g/dL — ABNORMAL LOW (ref 30.0–36.0)
MCV: 77.6 fL — ABNORMAL LOW (ref 80.0–100.0)
Platelets: 123 10*3/uL — ABNORMAL LOW (ref 150–400)
RBC: 3.89 MIL/uL (ref 3.87–5.11)
RDW: 28.9 % — ABNORMAL HIGH (ref 11.5–15.5)
WBC: 2.9 10*3/uL — ABNORMAL LOW (ref 4.0–10.5)
nRBC: 0 % (ref 0.0–0.2)

## 2022-01-11 LAB — PROTIME-INR
INR: 2.1 — ABNORMAL HIGH (ref 0.8–1.2)
Prothrombin Time: 23.8 seconds — ABNORMAL HIGH (ref 11.4–15.2)

## 2022-01-11 LAB — BASIC METABOLIC PANEL
Anion gap: 6 (ref 5–15)
BUN: 20 mg/dL (ref 6–20)
CO2: 26 mmol/L (ref 22–32)
Calcium: 8.5 mg/dL — ABNORMAL LOW (ref 8.9–10.3)
Chloride: 100 mmol/L (ref 98–111)
Creatinine, Ser: 1.18 mg/dL — ABNORMAL HIGH (ref 0.44–1.00)
GFR, Estimated: 58 mL/min — ABNORMAL LOW (ref >60)
Glucose, Bld: 79 mg/dL (ref 70–99)
Potassium: 4 mmol/L (ref 3.5–5.1)
Sodium: 132 mmol/L — ABNORMAL LOW (ref 135–145)

## 2022-01-11 MED ORDER — METOPROLOL SUCCINATE ER 25 MG PO TB24: 25.0000 mg | ORAL_TABLET | Freq: Every day | ORAL | 3 refills | Status: DC

## 2022-01-11 MED ORDER — LOSARTAN POTASSIUM 25 MG PO TABS: 12.5000 mg | ORAL_TABLET | Freq: Every day | ORAL | 3 refills | Status: DC

## 2022-01-11 MED ORDER — AMOXICILLIN-POT CLAVULANATE 875-125 MG PO TABS: 1.0000 | ORAL_TABLET | Freq: Two times a day (BID) | ORAL | 0 refills | Status: DC

## 2022-01-11 MED ORDER — OXYCODONE HCL 5 MG PO TABS: 5.0000 mg | ORAL_TABLET | Freq: Four times a day (QID) | ORAL | 0 refills | Status: DC | PRN

## 2022-01-11 MED ORDER — MIDODRINE HCL 2.5 MG PO TABS: 2.5000 mg | ORAL_TABLET | Freq: Two times a day (BID) | ORAL | 0 refills | Status: DC

## 2022-01-11 MED ORDER — POTASSIUM CHLORIDE ER 20 MEQ PO TBCR: 20.0000 meq | EXTENDED_RELEASE_TABLET | Freq: Two times a day (BID) | ORAL | 3 refills | Status: DC

## 2022-01-11 MED ORDER — WARFARIN SODIUM 5 MG PO TABS: 10.0000 mg | ORAL_TABLET | Freq: Once | ORAL | Status: DC

## 2022-01-11 MED ORDER — FUROSEMIDE 80 MG PO TABS: ORAL_TABLET | ORAL | 3 refills | Status: DC

## 2022-01-11 MED ORDER — FUROSEMIDE 40 MG PO TABS: 40.0000 mg | ORAL_TABLET | Freq: Two times a day (BID) | ORAL | Status: DC

## 2022-01-11 MED ORDER — FUROSEMIDE 40 MG PO TABS: 80.0000 mg | ORAL_TABLET | Freq: Two times a day (BID) | ORAL | Status: DC

## 2022-01-11 MED ORDER — DOXYCYCLINE HYCLATE 100 MG PO TABS: 100.0000 mg | ORAL_TABLET | Freq: Two times a day (BID) | ORAL | 0 refills | Status: AC

## 2022-01-11 NOTE — Care Management Important Message (Signed)
Important Message  Patient Details  Name: [name] [name] MRN: [id number] Date of Birth: 02-07-1975   Medicare Important Message Given:  Yes     Tommy Medal 01/11/2022, 9:39 AM

## 2022-01-11 NOTE — Progress Notes (Signed)
ANTICOAGULATION CONSULT NOTE -   Pharmacy Consult for heparin Indication: pulmonary embolus  Allergies  Allergen Reactions   Sulfa Antibiotics Itching, Swelling and Rash    Lip swelling    Patient Measurements: Height: '5\' 11"'$  (180.3 cm) Weight: (!) 159.9 kg (352 lb 8 oz) IBW/kg (Calculated) : 70.8 Heparin Dosing Weight: 107kg  Vital Signs: Temp: 98.4 F (36.9 C) (12/22 0527) Temp Source: Oral (12/22 0527) BP: 100/58 (12/22 0527) Pulse Rate: 58 (12/22 0527)  Labs: Recent Labs    01/09/22 0507 01/10/22 0445 01/11/22 0419  HGB 8.3* 8.0* 8.5*  HCT 29.1* 28.5* 30.2*  PLT 109* 122* 123*  LABPROT 26.8* 23.9* 23.8*  INR 2.5* 2.2* 2.1*  HEPARINUNFRC 0.76*  --   --   CREATININE 1.09* 1.10* 1.18*     Estimated Creatinine Clearance: 101.1 mL/min (A) (by C-G formula based on SCr of 1.18 mg/dL (H)).   Medical History: Past Medical History:  Diagnosis Date   Abdominal cramps 08/30/2015   Anemia    Blood transfusion without reported diagnosis    CAD (coronary artery disease)    Chronic combined systolic and diastolic CHF (congestive heart failure) (HCC)    Closed left ankle fracture    August 30 2012   Clotting disorder [facility])    DVT (deep venous thrombosis) (HCC)    L leg   Fibroid tumor    Hidradenitis    High blood pressure    Hypertension    Mitral regurgitation    NICM (nonischemic cardiomyopathy) (HCC)    NSVT (nonsustained ventricular tachycardia) (HCC)    Obesity    OSA on CPAP    Oxygen deficiency    Pregnancy    09/20/14 17 weeks   Pulmonary embolism ([place]) 04/2013   Sleep apnea    Tobacco use disorder     Medications:  Medications Prior to Admission  Medication Sig Dispense Refill Last Dose   albuterol (VENTOLIN HFA) 108 (90 Base) MCG/ACT inhaler Inhale 2 puffs into the lungs every 2 (two) hours as needed for wheezing or shortness of breath. 18 g 11 12/28/2021   atorvastatin (LIPITOR) 20 MG tablet Take 1 tablet (20 mg total) by mouth daily. (Patient  taking differently: Take 20 mg by mouth in the morning.) 90 tablet 3 12/28/2021   furosemide (LASIX) 80 MG tablet Take 1 tablet (80 mg total) by mouth daily. (Patient taking differently: Take 80 mg by mouth in the morning.) 30 tablet 3 12/27/2021   losartan (COZAAR) 25 MG tablet Take 1 tablet (25 mg total) by mouth daily. (Patient taking differently: Take 25 mg by mouth in the morning.) 90 tablet 3 12/28/2021   Magnesium Oxide 400 MG CAPS Take 1 capsule (400 mg total) by mouth daily. (Patient taking differently: Take 400 mg by mouth in the morning.) 90 capsule 3 12/28/2021   metoprolol succinate (TOPROL-XL) 50 MG 24 hr tablet Take 1 tablet (50 mg total) by mouth daily. Take with or immediately following a meal. (Patient taking differently: Take 50 mg by mouth in the morning.) 90 tablet 3 12/28/2021 at 0730   potassium chloride (KLOR-CON) 10 MEQ tablet Take 1 tablet (10 mEq total) by mouth daily. (Patient taking differently: Take 10 mEq by mouth in the morning.) 90 tablet 3 12/28/2021   spironolactone (ALDACTONE) 25 MG tablet Take 0.5 tablets (12.5 mg total) by mouth daily. (Patient taking differently: Take 12.5 mg by mouth in the morning.) 30 tablet 3 12/28/2021   warfarin (COUMADIN) 10 MG tablet Take 5-10 mg by  mouth See admin instructions. Take as directed in the evening : Monday take 10 mg and all other days take 5 mg.   12/28/2021   warfarin (COUMADIN) 5 MG tablet TAKE (1) OR (1) 1/2 TABLET BY MOUTH DAILY AS DIRECTED.   12/28/2021   amoxicillin-clavulanate (AUGMENTIN) 875-125 MG tablet Take 1 tablet by mouth every 12 (twelve) hours. (Patient not taking: Reported on 12/28/2021) 14 tablet 0 Completed Course   HYDROcodone-acetaminophen (NORCO/VICODIN) 5-325 MG tablet Take one tab po q 4 hrs prn pain (Patient not taking: Reported on 12/28/2021) 8 tablet 0 Completed Course   Scheduled:   amoxicillin-clavulanate  1 tablet Oral Q12H   atorvastatin  20 mg Oral Daily   doxycycline  100 mg Oral Q12H   furosemide  60  mg Intravenous TID AC   losartan  12.5 mg Oral Daily   metoprolol succinate  25 mg Oral Daily   midodrine  2.5 mg Oral BID WC   potassium chloride  40 mEq Oral Daily   spironolactone  12.5 mg Oral Daily   Warfarin - Pharmacist Dosing Inpatient   Does not apply q1600   Infusions:     Assessment:  Restarted warfarin 12/13 along with heparin bridge until INR >2.  INR now therapeutic so will discontinue heparin.  Home dose listed as 10 mg every Monday and 5 mg ROW.   INR 1.9 > 2.5> 2.2> 2.1- therapeutic Patient requiring larger doses than prior to admission home regimen. Hgb 8.0 stable  Goal of Therapy:  INR 2-3 Monitor platelets by anticoagulation protocol: Yes   Plan:  Warfarin 10 mg x 1 dose  Daily PT/INR Monitor for S/s of bleeding  Margot Ables, PharmD Clinical Pharmacist 01/11/2022 8:50 AM

## 2022-01-11 NOTE — Progress Notes (Signed)
   01/11/22 0941  ReDS Vest / Clip  BMI (Calculated) 48.02  Station Marker D  Ruler Value 37  ReDS Value Range (!) > 40  ReDS Actual Value 41

## 2022-01-11 NOTE — Progress Notes (Addendum)
Progress Note  Patient Name: [name] [name] Date of Encounter: 01/11/2022  Primary Cardiologist: Carlyle Dolly, MD  Subjective   No acute events overnight, no symptoms.  Inpatient Medications    Scheduled Meds:  amoxicillin-clavulanate  1 tablet Oral Q12H   atorvastatin  20 mg Oral Daily   doxycycline  100 mg Oral Q12H   furosemide  60 mg Intravenous TID AC   losartan  12.5 mg Oral Daily   metoprolol succinate  25 mg Oral Daily   midodrine  2.5 mg Oral BID WC   potassium chloride  40 mEq Oral Daily   spironolactone  12.5 mg Oral Daily   warfarin  10 mg Oral ONCE-1600   Warfarin - Pharmacist Dosing Inpatient   Does not apply q1600   Continuous Infusions:   PRN Meds: acetaminophen **OR** acetaminophen, albuterol, oxyCODONE   Vital Signs    Vitals:   01/10/22 1300 01/10/22 2210 01/10/22 2230 01/11/22 0527  BP: 122/79 103/71  (!) 100/58  Pulse: 85 83 83 (!) 58  Resp: '20 20 18 20  '$ Temp: 98.2 F (36.8 C) 99.7 F (37.6 C)  98.4 F (36.9 C)  TempSrc: Oral Oral  Oral  SpO2: 100% 100% 98% 99%  Weight:      Height:        Intake/Output Summary (Last 24 hours) at 01/11/2022 0936 Last data filed at 01/10/2022 2126 Gross per 24 hour  Intake 960 ml  Output 2300 ml  Net -1340 ml   Filed Weights   01/06/22 1500 01/07/22 0400 01/08/22 0743  Weight: (!) 158.6 kg (!) 160.6 kg (!) 159.9 kg    Telemetry     Personally reviewed, HR controlled and NSR  ECG    NSR and RBBB  Physical Exam   GEN: No acute distress.   Neck: Unable to examine JVD due to body habitus Cardiac: RRR, no murmur, rub, or gallop.  Respiratory: Nonlabored. Clear to auscultation bilaterally. GI: Soft, nontender, bowel sounds present. MS: 1+ pitting edema in b/l LE; No deformity. Neuro:  Nonfocal. Psych: Alert and oriented x 3. Normal affect.  Labs    Chemistry Recent Labs  Lab 01/09/22 0507 01/10/22 0445 01/11/22 0419  NA 133* 133* 132*  K 4.0 4.2 4.0  CL 104 102 100  CO2  21* 23 26  GLUCOSE 76 85 79  BUN '16 17 20  '$ CREATININE 1.09* 1.10* 1.18*  CALCIUM 8.4* 8.6* 8.5*  GFRNONAA >60 >60 58*  ANIONGAP '8 8 6     '$ Hematology Recent Labs  Lab 01/09/22 0507 01/10/22 0445 01/11/22 0419  WBC 3.1* 3.1* 2.9*  RBC 3.81* 3.72* 3.89  HGB 8.3* 8.0* 8.5*  HCT 29.1* 28.5* 30.2*  MCV 76.4* 76.6* 77.6*  MCH 21.8* 21.5* 21.9*  MCHC 28.5* 28.1* 28.1*  RDW 28.0* 28.7* 28.9*  PLT 109* 122* 123*    Cardiac EnzymesNo results for input(s): "TROPONINIHS" in the last 720 hours.  BNP No results for input(s): "BNP", "PROBNP" in the last 168 hours.    DDimerNo results for input(s): "DDIMER" in the last 168 hours.   Radiology    No results found.  Cardiac Studies  Echo from 09/2021 LVEF 30 to 27% RV systolic function is mildly reduced LA moderately dilated RA mildly dilated Moderate to severe posteriorly directed MR TR is mild to moderate   Assessment & Plan   Patient is a 46 year old F known to have NICM LVEF 30 to 35% with moderate to severe MR, DVT/PE history in 2015  s/p IVC filter with recurrence of DVT/PE in 1/23 s/p thrombectomy on lifelong Coumadin, OSA on CPAP presented to the ER with worsening right breast pain and swelling, currently admitted to hospitalist team for the management of right breast cellulitis and ADHF.  # ADHF/NICM LVEF 30-35% with moderate to severe MR/NYHA class II/ACC AHA stage C, currently approaching near compensation -Switch IV to p.o. Lasix 80 mg three times a day due to new drop in GFR. ReDS Value is 41 today. Will continue p.o. Lasix 80 mg three daily for 5 days followed by p.o. Lasix 40 mg twice daily. -Continue metoprolol succinate 25 mg once daily -Continue losartan 12.5 mg once daily -Continue spironolactone 12.5 mg once daily -Continue midodrine for blood pressure support -Daily weights, 1.2 L of fluid restriction and daily in and outs -She will benefit from outpatient advanced heart failure follow-up due to inability to  titrate GDMT from soft blood pressures  # Valvular heart disease, moderate to severe functional MR -Echo will need to be repeated after GDMT optimization to evaluate for MR severity and ICD candidacy, outpatient setting.  # DVT/PE history in 2015 s/p IVC filter with recurrence of DVT/PE in 1/23 s/p thrombectomy on lifelong Coumadin -Management per primary team  # Right breast cellulitis -Management per primary team  [place] will sign off.   Medication Recommendations: P.o. Lasix 80 mg three times daily for 5 days followed by p.o. Lasix 40 mg twice daily thereafter, metoprolol succinate 25 mg once daily, losartan 12.5 mg once daily, spironolactone 12.5 mg once daily, midodrine 2.5 mg twice daily Other recommendations (labs, testing, etc): BMP in 1 week Follow up as an outpatient: Outpatient follow-up with cardiology APP in 1 week and her cardiologist, Dr. Carlyle Dolly in 3 months upon discharge.   I have spent a total of 33 minutes with patient reviewing chart , telemetry, EKGs, labs and examining patient as well as establishing an assessment and plan that was discussed with the patient.  > 50% of time was spent in direct patient care.    Signed, Chalmers Guest, MD  01/11/2022, 9:36 AM

## 2022-01-11 NOTE — Discharge Summary (Signed)
Physician Discharge Summary  [name] [name] RCV:[id number] DOB: [birth year] DOA: 12/28/2021  PCP: Carrolyn Meiers, MD  Admit date: 12/28/2021 Discharge date: 01/11/2022  Admitted From: Home Disposition: Home  Recommendations for Outpatient Follow-up:  Follow up with PCP in 1-2 weeks Please obtain BMP/CBC in one week Follow-up with general surgery as needed Cardiology follow-up will be arranged by cardiology service.   Plans to follow-up with gynecology, Dr. Elonda Husky for further imaging/evaluation of right breast  Discharge Condition: Stable CODE STATUS: Full code Diet recommendation: Heart healthy  Brief/Interim Summary: 46 y.o. female, known history of DVT PE 2015 IVC filter prior on Xarelto --- recurrence DVT/PE January 2023 status post thrombectomy on lifelong anticoagulation with Coumadin secondary to weight >250 pounds, OSA on CPAP, Known uterine fibroids, Smoker With recent admission to [facility] in sept 2023 due to syncope, felt to be vasovagal, combined CHF history (EF 30-35%).Patient presented to the ED secondary to complaints of worsening right breast pain and swelling, simply discharged on oral Augmentin without much improvement, she was admitted for further workup.  During the hospitalization, she was noted to be in decompensated HF.  She was started on IV lasix.  Cardiology was consulted   Discharge Diagnoses:  Principal Problem:   Cellulitis of breast Active Problems:   Chronic pain syndrome   OSA on CPAP   Severe Iron deficiency anemia   Benign essential hypertension   Morbid obesity (HCC)   History of pulmonary embolism   HTN (hypertension)   Menorrhagia   S/P IVC filter   Bilateral pulmonary embolism ([place])   Long term current use of opiate analgesic   Chronic anticoagulation   Acute on chronic combined systolic and diastolic CHF (congestive heart failure) (HCC)  Cellulitis of right breast with abscess/mastitis -With worsening right breast  cellulitis despite being compliant with antibiotics, it is a concern for malignancy, especially with abnormal mammogram last year despite negative biopsy. -Continue with IV vancomycin given she failed oral Augmentin as an outpatient. -Pt going to [facility] 12/12 for IR to have US guided aspiration of right breast abscess.  Holding IV heparin for procedure.   -Medical suspicion for malignancy, so general surgery to perform punch biopsy during hospital stay, awaiting biopsy results  -ED discussed with Dr. Oneida Arenas, oncology, to be followed as an outpatient, as likely further imaging and liver biopsy needed in the outpatient setting.   - Breast US 12/31/21--US aspiration   - 12/9 biopsy>>acute on chronic inflammation -added cefepime for GNR>>Final culture = mixed anaerobes -02/01/2020 right breast bx>>neg malignancy -discussed with Dr. Okey Dupre -outpt follow up with med/onc -1219--d/c IV antibiotics--plan 7 days amox/clav + doxy    Acute on Chronic combined systolic and diastolic CHF - Echocardiogram from 08/23/2021 showed EF dropped to 30 to 35% from 45-50 % back on Jun 05, 2021 -This echo also showed moderate to severe mitral regurg that was directed posteriorly, -Grade 3 diastolic dysfunction was noted (restrictive pattern) -This echo also showed severe dilatation of the left ventricular internal cavity with LVIDd..  With worsening pulmonary artery pressures (Compared to prior TTE on 05/2021, the EF has dropped to  30-35% from 45-50%, the LV is now severely enlarged, there is now at least moderate MR (previously mild) and the filling pressures are significantly elevated.  -remains clinically fluid overloaded -appreciate cardiology consult -continue IV lasix>.increased to 60 mg bid 12/28 -3400cc urine output in last 24 hours -hypotension has limited GDMT>>lower dose losartan, metoprolol, spiro -12/15 ReDS= 55 -12/17 ReDS = 48 -12/22  ReDS = 41 -Discussed with cardiology and was felt reasonable to  transition to oral Lasix 80 mg p.o. 3 times daily for the next 5 days followed by 40 mg twice daily -Follow-up with cardiology will be arranged   DVT/PE (2015/2023) Chronic anticoagulation -INR was reversed after FFP given for biopsy procedure -sp thrombectomy (iliac) and IVC filter -Initially held warfarin in case further procedures needed, patient was placed on heparin bridge -Since her biopsy has been completed, she has been restarted on warfarin -INR therapeutic now       Anemia of iron deficiency -  Hemoglobin down to 7 -  transfused 2 units PRBC 12/11  -  Iron studies confirm iron deficiency (likely from menorrhagia) -  Hgb remains largely stable since transfusion -given nulecit x 2    obesity/OSA/pulmonary hypertension - continue CPAP nightly    Morbid Obesity -BMI 48 -lifestyle modification   Left Thigh Pain -CT thigh--neg for fluid collection or hematoma -now improved but still intermittent lateral discomfort -ambulates without difficulty  Discharge Instructions  Discharge Instructions     Diet - low sodium heart healthy   Complete by: As directed    Discharge wound care:   Complete by: As directed    Keep warm compresses on right breast   Increase activity slowly   Complete by: As directed       Allergies as of 01/11/2022       Reactions   Sulfa Antibiotics Itching, Swelling, Rash   Lip swelling        Medication List     STOP taking these medications    HYDROcodone-acetaminophen 5-325 MG tablet Commonly known as: NORCO/VICODIN       TAKE these medications    albuterol 108 (90 Base) MCG/ACT inhaler Commonly known as: VENTOLIN HFA Inhale 2 puffs into the lungs every 2 (two) hours as needed for wheezing or shortness of breath.   amoxicillin-clavulanate 875-125 MG tablet Commonly known as: AUGMENTIN Take 1 tablet by mouth every 12 (twelve) hours.   atorvastatin 20 MG tablet Commonly known as: LIPITOR Take 1 tablet (20 mg total) by  mouth daily. What changed: when to take this   doxycycline 100 MG tablet Commonly known as: VIBRA-TABS Take 1 tablet (100 mg total) by mouth every 12 (twelve) hours for 7 days.   furosemide 80 MG tablet Commonly known as: LASIX Take '80mg'$  po tid for 5 days then '40mg'$  po bid What changed:  how much to take how to take this when to take this additional instructions   losartan 25 MG tablet Commonly known as: COZAAR Take 0.5 tablets (12.5 mg total) by mouth daily. What changed: how much to take   Magnesium Oxide 400 MG Caps Take 1 capsule (400 mg total) by mouth daily. What changed: when to take this   metoprolol succinate 25 MG 24 hr tablet Commonly known as: TOPROL-XL Take 1 tablet (25 mg total) by mouth daily. Take with or immediately following a meal. What changed:  medication strength how much to take   midodrine 2.5 MG tablet Commonly known as: PROAMATINE Take 1 tablet (2.5 mg total) by mouth 2 (two) times daily with a meal.   oxyCODONE 5 MG immediate release tablet Commonly known as: Oxy IR/ROXICODONE Take 1 tablet (5 mg total) by mouth every 6 (six) hours as needed for moderate pain.   Potassium Chloride ER 20 MEQ Tbcr Take 20 mEq by mouth 2 (two) times daily. What changed:  medication strength how much to take  when to take this   spironolactone 25 MG tablet Commonly known as: ALDACTONE Take 0.5 tablets (12.5 mg total) by mouth daily. What changed: when to take this   warfarin 10 MG tablet Commonly known as: COUMADIN Take as directed. If you are unsure how to take this medication, talk to your nurse or doctor. Original instructions: Take 5-10 mg by mouth See admin instructions. Take as directed in the evening : Monday take 10 mg and all other days take 5 mg.   warfarin 5 MG tablet Commonly known as: COUMADIN Take as directed. If you are unsure how to take this medication, talk to your nurse or doctor. Original instructions: TAKE (1) OR (1) 1/2 TABLET BY  MOUTH DAILY AS DIRECTED.               Discharge Care Instructions  (From admission, onward)           Start     Ordered   01/11/22 0000  Discharge wound care:       Comments: Keep warm compresses on right breast   01/11/22 1121            Follow-up Information     Pappayliou, Barnetta Chapel A, DO Follow up.   Specialty: General Surgery Why: Follow up as needed Contact information: [address] [place] [facility] [address] [phone]         Carrolyn Meiers, MD. Schedule an appointment as soon as possible for a visit in 2 week(s).   Specialty: Internal Medicine Contact information: [place] [address] [phone]         Florian Buff, MD Follow up.   Specialties: Obstetrics and Gynecology, Radiology Why: follow up as scheduled Contact information: [place] [address] [phone]         Arnoldo Lenis, MD Follow up.   Specialty: Cardiology Why: office will contact you with appointment Contact information: [place] [address] [phone]                Allergies  Allergen Reactions   Sulfa Antibiotics Itching, Swelling and Rash    Lip swelling    Consultations: Cardiology General surgery   Procedures/Studies: CT FEMUR LEFT WO CONTRAST  Result Date: 01/04/2022 CLINICAL DATA:  Acute left thigh pain. Evaluate for hematoma. On IV heparin. EXAM: CT OF THE LOWER LEFT EXTREMITY WITHOUT CONTRAST TECHNIQUE: Multidetector CT imaging of the lower left extremity was performed according to the standard protocol. RADIATION DOSE REDUCTION: This exam was performed according to the departmental dose-optimization program which includes automated exposure control, adjustment of the mA and/or kV according to patient size and/or use of iterative reconstruction technique. COMPARISON:  None Available. FINDINGS: Bones/Joint/Cartilage There is no acute fracture or focal  osseous lesion. There is no dislocation. Joint spaces are well maintained. There is no joint effusion identified. Ligaments Suboptimally assessed by CT. Muscles and Tendons Deep fascial planes are preserved. No evidence for fluid collection or hematoma identified. Soft tissues There is diffuse subcutaneous edema with skin thickening of the lateral left thigh. There is also body wall edema. There is no foreign body, fluid collection or hematoma identified. Ascites is present. IMPRESSION: 1. No acute osseous abnormality. 2. Diffuse subcutaneous edema with skin thickening of the lateral left thigh. No evidence for fluid collection or hematoma. 3. Body wall edema. 4. Ascites. Electronically Signed   By: Ronney Asters M.D.   On: 01/04/2022 18:47   US  BREAST ASPIRATION RIGHT  Result Date: 01/01/2022 INDICATION: RIGHT BREAST MASTITIS, UNDERLYING ABSCESS, RECENT BIOPSY EXAM: Ultrasound right breast needle aspiration MEDICATIONS: 1% lidocaine local ANESTHESIA/SEDATION: Moderate (conscious) sedation was employed during this procedure. A total of Versed 2.0 mg and Fentanyl 100 mcg was administered intravenously by the radiology nurse. Total intra-service moderate Sedation Time: 17 minutes. The patient's level of consciousness and vital signs were monitored continuously by radiology nursing throughout the procedure under my direct supervision. COMPLICATIONS: None immediate. PROCEDURE: Informed written consent was obtained from the patient after a thorough discussion of the procedural risks, benefits and alternatives. All questions were addressed. Maximal Sterile Barrier Technique was utilized including caps, mask, sterile gowns, sterile gloves, sterile drape, hand hygiene and skin antiseptic. A timeout was performed prior to the initiation of the procedure. Previous imaging reviewed. Preliminary on performed. Complex retroareolar hypoechoic fluid pocket was localized at the 6 o'clock position. Overlying skin marked. Under  sterile conditions and local anesthesia, the Yueh sheath needle was advanced into the hypoechoic fluid collection with direct ultrasound guidance. Needle position confirmed with ultrasound. Images obtained for documentation. 8 cc purulent fluid aspirated. Sample sent for culture. Survey of the breast demonstrates diffuse edema. No additional large fluid pocket localized by ultrasound for additional aspiration or drainage. IMPRESSION: Ultrasound-guided right breast abscess needle aspiration. Electronically Signed   By: Jerilynn Mages.  Shick M.D.   On: 01/01/2022 15:59   US BREAST LTD UNI RIGHT INC AXILLA  Result Date: 12/31/2021 CLINICAL DATA:  RIGHT breast mass. Patient had ultrasound-guided core biopsy of mass in the retroareolar RIGHT breast performed 02/01/2020, showing benign concordant acute and chronic inflammation. Surgical consultation was recommended for discordant findings. More recently, patient presented to the ED secondary to complaints of worsening right breast pain and swelling. She was admitted for further workup. She underwent a punch biopsy of the right breast on 12/29/2021; results are pending. Per discussion with the technologist, patient has active drainage of purulent fluid from the MEDIAL aspect of the RIGHT breast, discrete from the biopsy site. EXAM: ULTRASOUND OF THE RIGHT BREAST COMPARISON:  02/01/2020 FINDINGS: Targeted ultrasound is performed, showing marked skin thickening in the circumareolar and retroareolar regions of the RIGHT breast. A discrete hypoechoic collection is contiguous with the thickened skin in the immediate retroareolar region. This collection is 1.8 x 2.7 x 2.0 centimeters. Considerations include inflammatory changes or abscess. IMPRESSION: Suspect retroareolar abscess measuring 2.7 centimeters. RECOMMENDATION: Consider ultrasound-guided aspiration of right breast. Recommend annual screening mammography. Last bilateral mammogram was performed 01/20/2020. BI-RADS CATEGORY  2:  Benign. These results will be called to the ordering clinician or representative by the Radiologist Assistant, and communication documented in the PACS or [employer]. Electronically Signed   By: Nolon Nations M.D.   On: 12/31/2021 12:23  CT Chest W Contrast  Result Date: 12/16/2021 CLINICAL DATA:  Chest wall mass EXAM: CT CHEST WITH CONTRAST TECHNIQUE: Multidetector CT imaging of the chest was performed during intravenous contrast administration. RADIATION DOSE REDUCTION: This exam was performed according to the departmental dose-optimization program which includes automated exposure control, adjustment of the mA and/or kV according to patient size and/or use of iterative reconstruction technique. CONTRAST:  51m OMNIPAQUE IOHEXOL 300 MG/ML  SOLN COMPARISON:  Mammogram 01/20/2020, breast ultrasound 02/01/2020, 01/20/2020, chest CT 06/05/2021. Chest x-ray 12/16/2021 FINDINGS: Cardiovascular: Nonaneurysmal aorta. Borderline cardiomegaly. No pericardial effusion. Mediastinum/Nodes: Midline trachea. No thyroid mass. Esophagus within normal limits. Multiple enlarged right axillary lymph nodes measuring up to 2.3 cm. Lungs/Pleura: No acute airspace disease, pleural  effusion, or pneumothorax. Mild subpleural reticulation within the right anterior upper lobe. Upper Abdomen: Small volume ascites adjacent to the liver and spleen. Generalized mesenteric stranding. Partially visualized IVC filter. 2 cm hyperenhancing area in the central liver, series 2, image 154. This appears present on 06/05/2021 chest CT with suggestion of peripheral enhancement as may be seen with hemangioma. Musculoskeletal: No acute osseous abnormality. Marked diffuse skin thickening and generalized edema and stranding throughout the entirety of the right breast. No discrete rim enhancing collection by CT. The nipple areola complex is poorly defined but suspect that the nipple is retracted. There is generalized edema throughout the  subcutaneous soft tissues consistent with anasarca. IMPRESSION: 1. Marked diffuse skin thickening involving the right breast with generalized edema and soft tissue stranding. Poorly defined nipple areola complex but suspect that the nipple is retracted, correlate with direct inspection. Primary differential considerations include inflammatory breast cancer versus mastitis. Surgical consultation is recommended. There are multiple enlarged right axillary lymph nodes. 2. There is considerable generalized subcutaneous edema diffusely consistent with anasarca. There is small volume ascites within the upper abdomen. 3. Hyperenhancing lesion within the central liver, potential hemangioma. When the patient is clinically stable and able to follow directions and hold their breath (preferably as an outpatient) further evaluation with dedicated abdominal MRI should be considered. Electronically Signed   By: Donavan Foil M.D.   On: 12/16/2021 16:13   DG Chest 2 View  Result Date: 12/16/2021 CLINICAL DATA:  cough EXAM: CHEST - 2 VIEW COMPARISON:  09/14/2018 FINDINGS: Cardiac silhouette enlarged. No evidence of pneumothorax or pleural effusion. No evidence of pulmonary edema or pneumonia. There are thoracic degenerative changes IMPRESSION: Enlarged cardiac silhouette.  No focal consolidation Electronically Signed   By: Sammie Bench M.D.   On: 12/16/2021 12:01      Subjective: Reports that she is feeling better.  Still has some swelling in her lower extremities.  When she walks, feels as though her legs are tight.  She does report good urine output.  Discharge Exam: Vitals:   01/10/22 2210 01/10/22 2230 01/11/22 0527 01/11/22 0941  BP: 103/71  (!) 100/58   Pulse: 83 83 (!) 58   Resp: '20 18 20   '$ Temp: 99.7 F (37.6 C)  98.4 F (36.9 C)   TempSrc: Oral  Oral   SpO2: 100% 98% 99%   Weight:    (!) 156.1 kg  Height:        General: Pt is alert, awake, not in acute distress Cardiovascular: RRR, S1/S2 +,  no rubs, no gallops Respiratory: CTA bilaterally, no wheezing, no rhonchi Abdominal: Soft, NT, ND, bowel sounds + Extremities: 1-2+ edema, no cyanosis    The results of significant diagnostics from this hospitalization (including imaging, microbiology, ancillary and laboratory) are listed below for reference.     Microbiology: Recent Results (from the past 240 hour(s))  Culture, blood (Routine X 2) w Reflex to ID Panel     Status: None (Preliminary result)   Collection Time: 01/08/22  5:54 PM   Specimen: BLOOD  Result Value Ref Range Status   Specimen Description BLOOD BLOOD RIGHT HAND  Final   Special Requests   Final    BOTTLES DRAWN AEROBIC AND ANAEROBIC Blood Culture adequate volume   Culture   Final    NO GROWTH 3 DAYS Performed at [facility], [address] [place], [place] [address]    Report Status PENDING  Incomplete  Culture, blood (Routine X 2) w Reflex to ID  Panel     Status: None (Preliminary result)   Collection Time: 01/08/22  5:57 PM   Specimen: BLOOD  Result Value Ref Range Status   Specimen Description BLOOD BLOOD LEFT HAND  Final   Special Requests   Final    BOTTLES DRAWN AEROBIC AND ANAEROBIC Blood Culture adequate volume   Culture   Final    NO GROWTH 3 DAYS Performed at [facility], [address], [place], [place] [address]    Report Status PENDING  Incomplete     Labs: BNP (last 3 results) Recent Labs    06/12/21 1409 09/22/21 1543 01/03/22 0325  BNP 293.6* 1,362.0* 3,710.6*   Basic Metabolic Panel: Recent Labs  Lab 01/06/22 0514 01/07/22 0433 01/08/22 0508 01/09/22 0507 01/10/22 0445 01/11/22 0419  NA 135 134* 133* 133* 133* 132*  K 3.5 3.7 4.0 4.0 4.2 4.0  CL 102 103 103 104 102 100  CO2 '26 23 22 '$ 21* 23 26  GLUCOSE 82 81 82 76 85 79  BUN '13 14 15 16 17 20  '$ CREATININE 0.93 0.93 1.02* 1.09* 1.10* 1.18*  CALCIUM 8.7* 8.4* 8.4* 8.4* 8.6* 8.5*  MG 1.9 1.9 2.0 2.0  --   --    Liver Function Tests: No results for input(s):  "AST", "ALT", "ALKPHOS", "BILITOT", "PROT", "ALBUMIN" in the last 168 hours. No results for input(s): "LIPASE", "AMYLASE" in the last 168 hours. No results for input(s): "AMMONIA" in the last 168 hours. CBC: Recent Labs  Lab 01/06/22 0514 01/08/22 0508 01/09/22 0507 01/10/22 0445 01/11/22 0419  WBC 4.2 4.3 3.1* 3.1* 2.9*  HGB 8.5* 8.5* 8.3* 8.0* 8.5*  HCT 30.7* 30.0* 29.1* 28.5* 30.2*  MCV 76.4* 75.9* 76.4* 76.6* 77.6*  PLT 159 136* 109* 122* 123*   Cardiac Enzymes: No results for input(s): "CKTOTAL", "CKMB", "CKMBINDEX", "TROPONINI" in the last 168 hours. BNP: Invalid input(s): "POCBNP" CBG: No results for input(s): "GLUCAP" in the last 168 hours. D-Dimer No results for input(s): "DDIMER" in the last 72 hours. Hgb A1c No results for input(s): "HGBA1C" in the last 72 hours. Lipid Profile No results for input(s): "CHOL", "HDL", "LDLCALC", "TRIG", "CHOLHDL", "LDLDIRECT" in the last 72 hours. Thyroid function studies No results for input(s): "TSH", "T4TOTAL", "T3FREE", "THYROIDAB" in the last 72 hours.  Invalid input(s): "FREET3" Anemia work up No results for input(s): "VITAMINB12", "FOLATE", "FERRITIN", "TIBC", "IRON", "RETICCTPCT" in the last 72 hours. Urinalysis    Component Value Date/Time   COLORURINE YELLOW 09/22/2021 1506   APPEARANCEUR HAZY (A) 09/22/2021 1506   APPEARANCEUR Cloudy (A) 07/21/2014 1326   LABSPEC 1.008 09/22/2021 1506   PHURINE 6.0 09/22/2021 1506   GLUCOSEU NEGATIVE 09/22/2021 1506   HGBUR NEGATIVE 09/22/2021 1506   BILIRUBINUR NEGATIVE 09/22/2021 1506   BILIRUBINUR Negative 07/21/2014 1326   KETONESUR NEGATIVE 09/22/2021 1506   PROTEINUR NEGATIVE 09/22/2021 1506   UROBILINOGEN 0.2 06/30/2014 0910   NITRITE NEGATIVE 09/22/2021 1506   LEUKOCYTESUR TRACE (A) 09/22/2021 1506   Sepsis Labs Recent Labs  Lab 01/08/22 0508 01/09/22 0507 01/10/22 0445 01/11/22 0419  WBC 4.3 3.1* 3.1* 2.9*   Microbiology Recent Results (from the past 240  hour(s))  Culture, blood (Routine X 2) w Reflex to ID Panel     Status: None (Preliminary result)   Collection Time: 01/08/22  5:54 PM   Specimen: BLOOD  Result Value Ref Range Status   Specimen Description BLOOD BLOOD RIGHT HAND  Final   Special Requests   Final    BOTTLES DRAWN AEROBIC AND  ANAEROBIC Blood Culture adequate volume   Culture   Final    NO GROWTH 3 DAYS Performed at [facility], [address], [place], [place] [address]    Report Status PENDING  Incomplete  Culture, blood (Routine X 2) w Reflex to ID Panel     Status: None (Preliminary result)   Collection Time: 01/08/22  5:57 PM   Specimen: BLOOD  Result Value Ref Range Status   Specimen Description BLOOD BLOOD LEFT HAND  Final   Special Requests   Final    BOTTLES DRAWN AEROBIC AND ANAEROBIC Blood Culture adequate volume   Culture   Final    NO GROWTH 3 DAYS Performed at [facility], [address], [place], [place] [address]    Report Status PENDING  Incomplete     Time coordinating discharge: 22mns  SIGNED:   JKathie Dike MD  [facility] 01/11/2022, 9:41 PM   If 7PM-7AM, please contact night-coverage [email or web address]

## 2022-01-13 LAB — CULTURE, BLOOD (ROUTINE X 2)
Culture: NO GROWTH
Culture: NO GROWTH
Special Requests: ADEQUATE
Special Requests: ADEQUATE

## 2022-01-16 ENCOUNTER — Ambulatory Visit: Payer: Medicare HMO | Attending: Cardiology | Admitting: *Deleted

## 2022-01-16 DIAGNOSIS — I2602 Saddle embolus of pulmonary artery with acute cor pulmonale: Secondary | ICD-10-CM

## 2022-01-16 DIAGNOSIS — Z86711 Personal history of pulmonary embolism: Secondary | ICD-10-CM | POA: Diagnosis not present

## 2022-01-16 DIAGNOSIS — I2782 Chronic pulmonary embolism: Secondary | ICD-10-CM

## 2022-01-16 DIAGNOSIS — Z5181 Encounter for therapeutic drug level monitoring: Secondary | ICD-10-CM | POA: Diagnosis not present

## 2022-01-16 LAB — POCT INR: INR: 2 (ref 2.0–3.0)

## 2022-01-16 NOTE — Patient Instructions (Signed)
Continue warfarin 1/2 tablet daily except 1 tablet on Mondays Recheck in 4 wks

## 2022-01-17 DIAGNOSIS — N611 Abscess of the breast and nipple: Secondary | ICD-10-CM | POA: Diagnosis not present

## 2022-01-17 DIAGNOSIS — R498 Other voice and resonance disorders: Secondary | ICD-10-CM | POA: Diagnosis not present

## 2022-01-17 DIAGNOSIS — I5030 Unspecified diastolic (congestive) heart failure: Secondary | ICD-10-CM | POA: Diagnosis not present

## 2022-01-17 DIAGNOSIS — I10 Essential (primary) hypertension: Secondary | ICD-10-CM | POA: Diagnosis not present

## 2022-01-17 DIAGNOSIS — I8291 Chronic embolism and thrombosis of unspecified vein: Secondary | ICD-10-CM | POA: Diagnosis not present

## 2022-01-18 ENCOUNTER — Encounter: Payer: Self-pay | Admitting: Nurse Practitioner

## 2022-01-18 ENCOUNTER — Ambulatory Visit: Payer: Medicare HMO | Attending: Nurse Practitioner | Admitting: Nurse Practitioner

## 2022-01-18 VITALS — BP 102/60 | HR 68 | Ht 71.0 in | Wt 316.0 lb

## 2022-01-18 DIAGNOSIS — Z86711 Personal history of pulmonary embolism: Secondary | ICD-10-CM | POA: Diagnosis not present

## 2022-01-18 DIAGNOSIS — Z87898 Personal history of other specified conditions: Secondary | ICD-10-CM | POA: Diagnosis not present

## 2022-01-18 DIAGNOSIS — I2510 Atherosclerotic heart disease of native coronary artery without angina pectoris: Secondary | ICD-10-CM

## 2022-01-18 DIAGNOSIS — Z79899 Other long term (current) drug therapy: Secondary | ICD-10-CM

## 2022-01-18 DIAGNOSIS — D61818 Other pancytopenia: Secondary | ICD-10-CM

## 2022-01-18 DIAGNOSIS — I10 Essential (primary) hypertension: Secondary | ICD-10-CM

## 2022-01-18 DIAGNOSIS — I5020 Unspecified systolic (congestive) heart failure: Secondary | ICD-10-CM

## 2022-01-18 DIAGNOSIS — Z86718 Personal history of other venous thrombosis and embolism: Secondary | ICD-10-CM

## 2022-01-18 DIAGNOSIS — I428 Other cardiomyopathies: Secondary | ICD-10-CM

## 2022-01-18 DIAGNOSIS — R52 Pain, unspecified: Secondary | ICD-10-CM

## 2022-01-18 DIAGNOSIS — I5043 Acute on chronic combined systolic (congestive) and diastolic (congestive) heart failure: Secondary | ICD-10-CM

## 2022-01-18 DIAGNOSIS — I4729 Other ventricular tachycardia: Secondary | ICD-10-CM | POA: Diagnosis not present

## 2022-01-18 NOTE — Progress Notes (Signed)
Cardiology Office Note:    Date:  01/18/2022  ID:  [name] [name], DOB 06-Feb-1975, MRN [id number]  PCP:  Benetta Spar, MD   [facility] Providers Cardiologist:  Dina Rich, MD Electrophysiologist:  Maurice Small, MD     Referring MD: Benetta Spar*   CC: Here for hospital follow-up  History of Present Illness:    [name] [name] is a 46 y.o. female with a hx of the following:  Recurrent unprovoked DVT 01/2021 (missed 2 doses of Coumadin), s/p thrombectomy Hx of unprovoked saddle PE 05/2013 Hx of IVC filter placed in 2015 Pulmonary HTN Chronic combined CHF, NICM Moderate to severe MR Nonobstructive CAD (Cath 05/2021) Chronic anemia  NSVT HTN OSA on CPAP Obesity  Patient is a pleasant 46 year old female past medical history as mentioned above.  She was evaluated by Dr. Wyline Mood in 2019 for PE in 2015 to get set up with Coumadin clinic.  Hematology recommended Coumadin over DOAC's for her.  In January 2023 she had recurrent left lower extremity DVT in setting of missing 2 doses of Coumadin.  CTA was checked for PE, negative.  Was admitted in May 2023 for community acquired pneumonia, CHF, worsening anemia requiring 3 units of packed red blood cells, echocardiogram revealed mildly reduced EF at 45 to 50% requiring diuresis.    Was readmitted later in May 2023 with respiratory failure requiring intubation, was felt to be likely combination of CHF and pneumonia.  Right left heart cath in May 2023 revealed 40 to 50% proximal RCA narrowing, otherwise normal coronary arteries.  LVEDP was elevated, consistent with acute on chronic combined CHF.  Blood pressures were soft during admission, therefore limiting medication therapy.  Repeat echo in August 2023 revealed EF reduced to 30 to 35%.    Was admitted in September 2023 with syncope, nausea, dizziness with subsequent C2 fracture.  She was leaning over her truck, felt nauseous, face planted down and  went completely out.  Neurosurgery did not recommend operative management.  Repeat echocardiogram revealed EF 30 to 35%, mild LVH, mildly elevated PASP, mildly reduced RV SF, mild RVE, moderate LAE, mild RAE, moderate to severe MR, mild to moderate TR.  It was felt that syncope was vasovagal, outpatient monitor was recommended.  Preliminary report revealed normal sinus rhythm, occasional NSVT (max lasting 9 beats), 4% PVCs, and less than 1% PACs. Seen by Ronie Spies, PA-C on November 13, 2021 for syncope and collapse.  Denied any chest pain, orthopnea, recurrent syncope, or edema.  Did admit to smoking 3 to 4 cigarettes/day.  Cut down her alcohol use.  She was referred to EP and HF clinic.  She was advised not to drive until further eval from EP team. Cardiac MRI arranged but has not been completed. Labs were ordered. Dayna Dunn, PA-C recommended adding losartan 25 mg daily, increasing metoprolol succinate to 50 mg daily, adding potassium chloride 10 mill equivalent daily, adding magnesium oxide 400 mg daily, and repeating labs in 1 week and labs were found to be stable.   Saw Dr. Nelly Laurence on 12/20/21. CC was swelling of right breast, was evaluated in ER and referred for outpatient management with her gynecologist. Denied exertional CP and shortness of breath.  Dr. Nelly Laurence stated that in setting of her gastritis, her syncopal episode sounded very likely vasovagal.  Repeat echocardiogram was arranged for after February 14, 2022.  If her EF remained less than 35%, he would recommend placing a defibrillator.   Presented to [facility]  ED on 12/28/21 for right breast pain and swelling, endorsed drainage from nipple. Symptoms were most consistent with cellulitis, was admitted for further observation and management. Coumadin was held, was bridged with heparin, and underwent punch biopsy by general surgery on 12/29/21. Pathology results showed benign breast parenchyma with acute and chronic inflammation. Ultrasound showed retro areolar  abscess and underwent aspiration on 01/01/2022. Hospital course complicated by IDA, required 2 units of PRBC for low hemoglobin. BNP was elevated at 1232, found to be having acute CHF exacerbation. Received IV Lasix. Cardiology was consulted. Noted lower extremity swelling during hospitalization. Titration of GDMT was limited d/t baseline hypotension during hospitalization, some antihypertensives were held. BLE improved.  Cardiology recommended that she would benefit from outpatient advanced heart failure follow-up due to inability to titrate GDMT. Experienced left thigh pain during hospitalization.  CT performed at that time was negative for fluid collection or hematoma.  Pain began to improve but still noted intermittent lateral discomfort.  She was able to ambulate without difficulty.  Transitioned to PO ABX at d/c.  D/C in stable condition on 01/11/2022.   Today she presents for follow-up. She states she is doing okay. CC today is right breast and left thigh pain.  Denies any chest pain.  Does admit to stable shortness of breath with exertion.  Denies any palpitations, syncope, presyncope, dizziness, orthopnea, PND, worsening swelling, significant weight changes, acute bleeding, or claudication.  She follows Coumadin clinic here in [place] office.  Says she is in significant pain, and has completed pain medication that was prescribed to her.  She also has 1 more dose of antibiotic remaining.  Denies any other questions or concerns today.  Past Medical History:  Diagnosis Date   Abdominal cramps 08/30/2015   Anemia    Blood transfusion without reported diagnosis    CAD (coronary artery disease)    Chronic combined systolic and diastolic CHF (congestive heart failure) (HCC)    Closed left ankle fracture    August 30 2012   Clotting disorder [facility])    DVT (deep venous thrombosis) (HCC)    L leg   Fibroid tumor    Hidradenitis    High blood pressure    Hypertension    Mitral regurgitation    NICM  (nonischemic cardiomyopathy) (HCC)    NSVT (nonsustained ventricular tachycardia) (HCC)    Obesity    OSA on CPAP    Oxygen deficiency    Pregnancy    09/20/14 17 weeks   Pulmonary embolism (HCC) 04/2013   Sleep apnea    Tobacco use disorder     Past Surgical History:  Procedure Laterality Date   ABSCESS DRAINAGE Bilateral 01/10/15   COLONOSCOPY WITH PROPOFOL N/A 06/08/2018   Procedure: COLONOSCOPY WITH PROPOFOL;  Surgeon: Corbin Ade, MD;  Location: [facility] SUITE;  Service: Endoscopy;  Laterality: N/A;  2:30pm   CYST REMOVAL TRUNK     IVC FILTER PLACEMENT ([facility] HX)     LOWER EXTREMITY VENOGRAPHY N/A 02/07/2021   Procedure: LOWER EXTREMITY VENOGRAPHY;  Surgeon: Victorino Sparrow, MD;  Location: [facility] INVASIVE CV LAB;  Service: Cardiovascular;  Laterality: N/A;   PERIPHERAL VASCULAR THROMBECTOMY N/A 02/07/2021   Procedure: PERIPHERAL VASCULAR THROMBECTOMY;  Surgeon: Victorino Sparrow, MD;  Location: [facility] INVASIVE CV LAB;  Service: Cardiovascular;  Laterality: N/A;   RIGHT/LEFT HEART CATH AND CORONARY ANGIOGRAPHY N/A 06/19/2021   Procedure: RIGHT/LEFT HEART CATH AND CORONARY ANGIOGRAPHY;  Surgeon: Lyn Records, MD;  Location: [facility] INVASIVE CV LAB;  Service: Cardiovascular;  Laterality: N/A;    Current Medications: Current Meds  Medication Sig   albuterol (VENTOLIN HFA) 108 (90 Base) MCG/ACT inhaler Inhale 2 puffs into the lungs every 2 (two) hours as needed for wheezing or shortness of breath.   amoxicillin-clavulanate (AUGMENTIN) 875-125 MG tablet Take 1 tablet by mouth every 12 (twelve) hours.   atorvastatin (LIPITOR) 20 MG tablet Take 1 tablet (20 mg total) by mouth daily. (Patient taking differently: Take 20 mg by mouth in the morning.)   [EXPIRED] doxycycline (VIBRA-TABS) 100 MG tablet Take 1 tablet (100 mg total) by mouth every 12 (twelve) hours for 7 days.   furosemide (LASIX) 80 MG tablet Take 80mg  po tid for 5 days then 40mg  po bid   losartan (COZAAR) 25 MG tablet Take 0.5 tablets  (12.5 mg total) by mouth daily.   Magnesium Oxide 400 MG CAPS Take 1 capsule (400 mg total) by mouth daily. (Patient taking differently: Take 400 mg by mouth in the morning.)   metoprolol succinate (TOPROL-XL) 25 MG 24 hr tablet Take 1 tablet (25 mg total) by mouth daily. Take with or immediately following a meal.   midodrine (PROAMATINE) 2.5 MG tablet Take 1 tablet (2.5 mg total) by mouth 2 (two) times daily with a meal.   oxyCODONE (OXY IR/ROXICODONE) 5 MG immediate release tablet Take 1 tablet (5 mg total) by mouth every 6 (six) hours as needed for moderate pain.   potassium chloride 20 MEQ TBCR Take 20 mEq by mouth 2 (two) times daily.   spironolactone (ALDACTONE) 25 MG tablet Take 0.5 tablets (12.5 mg total) by mouth daily. (Patient taking differently: Take 12.5 mg by mouth in the morning.)   warfarin (COUMADIN) 10 MG tablet Take 5-10 mg by mouth See admin instructions. Take as directed in the evening : Monday take 10 mg and all other days take 5 mg.   warfarin (COUMADIN) 5 MG tablet TAKE (1) OR (1) 1/2 TABLET BY MOUTH DAILY AS DIRECTED.     Allergies:   Sulfa antibiotics   Social History   Socioeconomic History   Marital status: Widowed    Spouse name: Not on file   Number of children: Not on file   Years of education: Not on file   Highest education level: Not on file  Occupational History   Not on file  Tobacco Use   Smoking status: Some Days    Packs/day: 0.25    Years: 18.00    Total pack years: 4.50    Types: Cigarettes   Smokeless tobacco: Never   Tobacco comments:    smokes 3 cig a day ARJ 02/15/21  Vaping Use   Vaping Use: Never used  Substance and Sexual Activity   Alcohol use: Yes    Alcohol/week: 2.0 standard drinks of alcohol    Types: 2 Cans of beer per week    Comment: twice a month   Drug use: No   Sexual activity: Not Currently    Birth control/protection: Pill, None  Other Topics Concern   Not on file  Social History Narrative   Worked at a hotel.  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    Has a 45 year old Aviauna.   Live with parents.   Was working 5 days a weeks.   Not working right now.    Attends church.    Social Determinants of Health   Financial Resource Strain: Not on file  Food Insecurity: No Food Insecurity (01/03/2022)  Hunger Vital Sign    Worried About Running Out of Food in the Last Year: Never true    Ran Out of Food in the Last Year: Never true  Transportation Needs: No Transportation Needs (01/03/2022)   PRAPARE - Administrator, Civil Service (Medical): No    Lack of Transportation (Non-Medical): No  Physical Activity: Not on file  Stress: Not on file  Social Connections: Not on file     Family History: The patient's family history includes Cancer in her maternal grandmother; Diabetes in her paternal aunt and paternal uncle; Heart attack in her paternal grandmother; Hypertension in her mother; Other in her father. There is no history of Colon cancer.  ROS:   Review of Systems  Constitutional: Negative.   HENT: Negative.    Eyes: Negative.   Respiratory:  Positive for shortness of breath. Negative for cough, hemoptysis, sputum production and wheezing.        See HPI.   Cardiovascular: Negative.   Gastrointestinal: Negative.   Genitourinary:  Negative for dysuria, flank pain, frequency, hematuria and urgency.       Right breast swelling and pain  Musculoskeletal:  Positive for myalgias. Negative for back pain, falls, joint pain and neck pain.       Left thigh pain.  See HPI.  Skin: Negative.   Neurological: Negative.   Endo/Heme/Allergies: Negative.   Psychiatric/Behavioral: Negative.      Please see the history of present illness.    All other systems reviewed and are negative.  EKGs/Labs/Other Studies Reviewed:    The following studies were reviewed today:   EKG:  EKG is not ordered today.     Cardiac monitor on 12/17/2021:   30 day monitor. Data available from 75% of  planned monitored time   Min HR 61, Max HR 159, Avg HR 81   Symptoms correlated with sinus rhythm, rare PVCs.   Short runs of SVT at times with aberrancy without symptoms reported. 8 and 4  beat run of NSVT no reported symptoms  2D echocardiogram on September 24, 2021: 1. Left ventricular ejection fraction, by estimation, is 30 to 35%. The  left ventricle has moderately decreased function. Left ventricular  endocardial border not optimally defined to evaluate regional wall motion,  global hypokinesis seen. The left  ventricular internal cavity size was moderately dilated. There is mild  left ventricular hypertrophy. Left ventricular diastolic parameters are  indeterminate.   2. Right ventricular systolic function is mildly reduced. The right  ventricular size is mildly enlarged. There is mildly elevated pulmonary  artery systolic pressure. The estimated right ventricular systolic  pressure is 36.3 mmHg.   3. Left atrial size was moderately dilated.   4. Right atrial size was mildly dilated.   5. MR mechanism appears to be posterior leaflet restriction with relative  anterior leaflet override with posteriorly directed jet of MR. Review of  serial studies suggests regional wall motion abnormalities contribute to  posterior leaflet restriction. No   pulmonary vein reversal seen on this study, systolic PV blunting.. The  mitral valve is grossly normal. Moderate to severe mitral valve  regurgitation. No evidence of mitral stenosis.   6. Tricuspid valve regurgitation is mild to moderate.   7. The aortic valve is tricuspid. Aortic valve regurgitation is not  visualized. No aortic stenosis is present.   8. The inferior vena cava is dilated in size with <50% respiratory  variability, suggesting right atrial pressure of 15 mmHg.  Right and  left heart cath on Jun 19, 2021: 40 to 50% proximal right coronary narrowing. Right dominant anatomy Coronary arteries otherwise normal Significant  elevation LVEDP 30 mmHg consistent with acute on chronic combined systolic and diastolic heart failure Mild pulmonary hypertension, mean pressure 30 mmHg.  WHO group II etiology based on hemodysnamics.. Capillary wedge mean 24 mmHg.  V wave to 40 mmHg suggesting some degree of mitral regurgitation. Cardiac output 8.5 L/min with index 3.57 L/min/m. Pulmonary resistance 0.7 Wood units.   RECOMMENDATIONS: Care with IV fluid administration. Needs diuresis. Start heart failure therapy including ARNI and SGLT2 as tolerated.  Peripheral vascular thrombectomy on 02/17/2021: Completion venogram of the left leg demonstrated resolution of the thrombotic burden with no flow-limiting stenosis appreciated through the common femoral vein and femoral vein.  There was narrowing appreciated in the external iliac vein, however as noted on previous CT, this appears to be due to to fibroids.  I elected not to stent the patient, and will have her see obstetrics and gynecology.  I do not think this was the nidus for her thrombus, but rather having a subtherapeutic INR.   [name] [name] tolerated the case well.  The percutaneous access site was closed with 4-0 Monocryl suture.  Custom compression stockings have been ordered, and the leg was wrapped postoperatively.   Venous ultrasound of lower left extremity on January 30, 2021: Acute, nearly occlusive thrombus extending from the left popliteal vein through the common femoral vein. Extent of thrombus central to the inguinal ligament is unable to be determined on this study. Poor visualization of the calf veins.   Recommend CTV abdomen pelvis to evaluate for central extent of left lower extremity deep vein thrombosis. If thrombus extends into the pelvis, recommend consultation to Interventional Radiology or other qualified endovascular specialist.   Recent Labs: 11/14/2021: ALT 29; TSH 3.122 01/03/2022: B Natriuretic Peptide 1,232.0 01/09/2022: Magnesium  2.0 01/11/2022: BUN 20; Creatinine, Ser 1.18; Hemoglobin 8.5; Platelets 123; Potassium 4.0; Sodium 132  Recent Lipid Panel    Component Value Date/Time   CHOL 77 11/14/2021 0816   TRIG 35 11/14/2021 0816   HDL 27 (L) 11/14/2021 0816   CHOLHDL 2.9 11/14/2021 0816   VLDL 7 11/14/2021 0816   LDLCALC 43 11/14/2021 0816   LDLCALC 92 01/31/2017 1119    Physical Exam:    VS:  BP 102/60   Pulse 68   Ht 5\' 11"  (1.803 m)   Wt (!) 316 lb (143.3 kg)   SpO2 97%   BMI 44.07 kg/m     Wt Readings from Last 3 Encounters:  01/18/22 (!) 316 lb (143.3 kg)  01/11/22 (!) 344 lb 2.2 oz (156.1 kg)  12/20/21 (!) 329 lb (149.2 kg)     GEN: Morbidly obese, 46 y.o. female in no acute distress, appears to be in pain HEENT: Normal NECK: No JVD; No carotid bruits CARDIAC: S1/S2, RRR, no murmurs, rubs, gallops; 2+ peripheral pulses throughout, strong bilaterally RESPIRATORY:  Clear to auscultation without rales, wheezing or rhonchi  ABDOMEN: Soft, non-tender, non-distended MUSCULOSKELETAL:  Significant swelling/enlargement of right breast; No pitting edema and no deformity  SKIN: Warm and dry NEUROLOGIC:  Alert and oriented x 3 PSYCHIATRIC:  Normal affect   ASSESSMENT:    1. HFrEF (heart failure with reduced ejection fraction) (HCC)   2. NICM (nonischemic cardiomyopathy) (HCC)   3. History of syncope   4. NSVT (nonsustained ventricular tachycardia) (HCC)   5. Coronary artery disease involving native heart without angina pectoris, unspecified vessel or lesion  type   6. Hypertension, unspecified type   7. Medication management   8. History of pulmonary embolism   9. History of DVT (deep vein thrombosis)   10. Pancytopenia (HCC)   11. Acute pain   12. Morbid obesity (HCC)    PLAN:    In order of problems listed above:  HFrEF, NICM Recent hospital course complicated by acute on chronic CHF. TTE in 09/2021 showed EF 30 to 35%, moderately decreased left ventricular function, global hypokinesis  seen of left ventricle, mild LVH, indeterminate left ventricular diastolic parameters. Euvolemic and well compensated on exam.  Tolerating meds well and is compliant.  Blood pressures are soft today and I am hesitant to uptitrate GDMT at this time, she is asymptomatic with this.  Will refer to HF clinic as previously recommended during hospital stay.  Etiology unclear for heart failure.  Cardiac MRI has been arranged and ordered previously.  Continue current medication regimen. Low sodium diet, fluid restriction <2L, and daily weights encouraged. Educated to contact our office for weight gain of 2 lbs overnight or 5 lbs in one week.  Hx of syncope and hx of NSVT In the past, syncopal episode was thought to be vasovagal.  Saw Dr. Nelly Laurence for follow-up who also believes this was due to vasovagal episode. Monitor revealed NSVT/PVC's. Dr. Nelly Laurence arranged repeat TTE after February 14, 2022 to determine EF.  He stated if EF remains less than 35%, would recommend placing defibrillator.  Continue current medication regimen.  Cardiac MRI has been previously arranged as mentioned above.  Continue current medication regimen.  Heart healthy diet recommended.  CAD Stable with no anginal symptoms. No indication for ischemic evaluation.  Previous cardiac catheterization 05/2021 revealed nonobstructive CAD.  Not on aspirin due to being on Coumadin.  Continue atorvastatin, losartan, and Toprol-XL.  Heart healthy diet recommended.  HTN Blood pressure today 102/60.  She admits to similar BP at home.  Asymptomatic with this.  Again, I am hesitant to uptitrate GDMT at this time.  Continue current medication regimen.  Referring to heart failure clinic as mentioned above. Discussed to monitor BP at home at least 2 hours after medications and sitting for 5-10 minutes. Will obtain BMET per hospitalist's request.  Heart healthy diet recommended.  Hx of PE/DVT Previous history of unprovoked saddle PE in 2015.  History of recurrent  unprovoked DVT with hx of thrombectomy in 01/2021. Denies any bleeding issues while on Coumadin.  Continue to follow-up with Coumadin clinic. Referring to Hem/Onc as mentioned below.   Hx of pancytopenia Hospital course revealed low WBC, minimally low RBC, and low platelets. Received PRBC in hospital. Denies any fever, chills, or flu like symptoms.  Has had history of VTE.  She says she has been seen by Hem/Onc in the past, will place Hem/Onc referral. Will obtain CBC per hospitalist's request.   7. Acute pain Does admit to acute pain of right breast tissue and left thigh.  She states that she has completed pain pill.  I recommended that she speak to her primary care doctor about this or seek urgent care.   8. Morbid obesity BMI today 44.07. Weight loss via diet and exercise as tolerated encouraged. Discussed the impact being overweight would have on cardiovascular risk.    9.  Disposition: Follow up with me in 6-8 weeks. Follow up with Dr. Dina Rich in 6 months.  Medication Adjustments/Labs and Tests Ordered: Current medicines are reviewed at length with the patient today.  Concerns regarding medicines  are outlined above.  Orders Placed This Encounter  Procedures   Basic metabolic panel   CBC   Ambulatory referral to Hematology / Oncology   AMB referral to CHF clinic   No orders of the defined types were placed in this encounter.   Patient Instructions  Medication Instructions: Your physician recommends that you continue on your current medications as directed. Please refer to the Current Medication list given to you today.   Labwork: Bmet,CBC   Testing/Procedures: None today  Follow-Up: 6-8 weeks with E.Nance Mccombs, 6 months Dr.Branch,  Referral to hematology/oncology  Referral to Heart Failure Clinic  Daily Weight Record It is important to weigh yourself daily. To do this: Make sure you use a reliable scale. Use the same scale each day. Keep this daily weight chart  near your scale. Weigh yourself each morning at the same time after you use the bathroom. Before weighing yourself: Take off your shoes. Make sure you are wearing the same amount of clothing each day. Write down your weight in the spaces on the form. Compare today's weight to yesterday's weight. Bring this form with you to your follow-up visits with your health care provider. Call your health care provider if you have concerns about your weight, including rapid weight gain or loss. Date: ________ Weight: ____________________ Date: ________ Weight: ____________________ Date: ________ Weight: ____________________ Date: ________ Weight: ____________________ Date: ________ Weight: ____________________ Date: ________ Weight: ____________________ Date: ________ Weight: ____________________ Date: ________ Weight: ____________________ Date: ________ Weight: ____________________ Date: ________ Weight: ____________________ Date: ________ Weight: ____________________ Date: ________ Weight: ____________________ Date: ________ Weight: ____________________ Date: ________ Weight: ____________________ Date: ________ Weight: ____________________ Date: ________ Weight: ____________________ Date: ________ Weight: ____________________ Date: ________ Weight: ____________________ Date: ________ Weight: ____________________ Date: ________ Weight: ____________________ Date: ________ Weight: ____________________ Date: ________ Weight: ____________________ Date: ________ Weight: ____________________ Date: ________ Weight: ____________________ Date: ________ Weight: ____________________ Date: ________ Weight: ____________________ Date: ________ Weight: ____________________ Date: ________ Weight: ____________________ Date: ________ Weight: ____________________ Date: ________ Weight: ____________________ Date: ________ Weight: ____________________ Date: ________ Weight: ____________________ Date: ________ Weight:  ____________________ Date: ________ Weight: ____________________ Date: ________ Weight: ____________________ Date: ________ Weight: ____________________ Date: ________ Weight: ____________________ Date: ________ Weight: ____________________ Date: ________ Weight: ____________________ Date: ________ Weight: ____________________ Date: ________ Weight: ____________________ Date: ________ Weight: ____________________ Date: ________ Weight: ____________________ Date: ________ Weight: ____________________ Date: ________ Weight: ____________________ Date: ________ Weight: ____________________ Date: ________ Weight: ____________________ Date: ________ Weight: ____________________ Date: ________ Weight: ____________________ Date: ________ Weight: ____________________ This information is not intended to replace advice given to you by your health care provider. Make sure you discuss any questions you have with your health care provider. Document Revised: 09/12/2020 Document Reviewed: 09/12/2020 Elsevier Patient Education  2023 Else[employer] Inc. Blood Pressure Record Sheet To take your blood pressure, you will need a blood pressure machine. You may be prescribed one, or you can buy a blood pressure machine (blood pressure monitor) at your clinic, drug store, or online. When choosing one, look for these features: An automatic monitor that has an arm cuff. A cuff that wraps snugly, but not too tightly, around your upper arm. You should be able to fit only one finger between your arm and the cuff. A device that stores blood pressure reading results. Do not choose a monitor that measures your blood pressure from your wrist or finger. Follow your health care provider's instructions for how to take your blood pressure. To use this form: Get one reading in the morning (a.m.) before you take any medicines. Get one reading  in the evening (p.m.) before supper. Take at least two readings with each blood pressure check.  This makes sure the results are correct. Wait 1-2 minutes between measurements. Write down the results in the spaces on this form. Repeat this once a week, or as told by your health care provider. Make a follow-up appointment with your health care provider to discuss the results. Blood pressure log Date: _______________________ a.m. _____________________(1st reading) _____________________(2nd reading) p.m. _____________________(1st reading) _____________________(2nd reading) Date: _______________________ a.m. _____________________(1st reading) _____________________(2nd reading) p.m. _____________________(1st reading) _____________________(2nd reading) Date: _______________________ a.m. _____________________(1st reading) _____________________(2nd reading) p.m. _____________________(1st reading) _____________________(2nd reading) Date: _______________________ a.m. _____________________(1st reading) _____________________(2nd reading) p.m. _____________________(1st reading) _____________________(2nd reading) Date: _______________________ a.m. _____________________(1st reading) _____________________(2nd reading) p.m. _____________________(1st reading) _____________________(2nd reading) This information is not intended to replace advice given to you by your health care provider. Make sure you discuss any questions you have with your health care provider. Document Revised: 09/21/2020 Document Reviewed: 09/21/2020 Elsevier Patient Education  2023 Elsevier Inc. .Melessia Kaus,NP, 6 months Dr.Branch  Any Other Special Instructions Will Be Listed Below (If Applicable).  If you need a refill on your cardiac medications before your next appointment, please call your pharmacy. Heart Failure, Diagnosis  Heart failure is a condition in which the heart has trouble pumping blood. This may mean that the heart cannot pump enough blood out to the body or that the heart does not fill up with enough blood. For some  people with heart failure, fluid may back up into the lungs. There may also be swelling (edema) in the lower legs. Heart failure is usually a long-term (chronic) condition. It is important for you to take good care of yourself and follow the treatment plan from your health care provider. Different stages of heart failure have different treatment plans. The stages are: Stage A: At risk for heart failure. Having no symptoms of heart failure, but being at risk for developing heart failure. Stage B: Pre-heart failure. Having no symptoms of heart failure, but having structural changes to the heart that indicate heart failure. Stage C: Symptomatic heart failure. Having symptoms of heart failure in addition to structural changes to the heart that indicate heart failure. Stage D: Advanced heart failure. Having symptoms that interfere with daily life and frequent hospitalizations related to heart failure. What are the causes? This condition may be caused by: High blood pressure (hypertension). Hypertension causes the heart muscle to work harder than normal. Coronary artery disease, or CAD. CAD is the buildup of cholesterol and fat (plaque) in the arteries of the heart. Heart attack, also called myocardial infarction. This injures the heart muscle, making it hard for the heart to pump blood. Abnormal heart valves. The valves do not open and close properly, forcing the heart to pump harder to keep the blood flowing. Heart muscle disease, inflammation, or infection (cardiomyopathy or myocarditis). This is damage to the heart muscle. It can increase the risk of heart failure. Lung disease. The heart works harder when the lungs are not healthy. What increases the risk? The risk of heart failure increases as a person ages. This condition is also more likely to develop in people who: Are obese. Use tobacco or nicotine products. Abuse alcohol or drugs. Have taken medicines that can damage the heart, such as  chemotherapy drugs. Have any of these conditions: Diabetes. Abnormal heart rhythms. Thyroid problems. Low blood counts (anemia). Chronic kidney disease. Have a family history of heart failure. What are the signs or symptoms? Symptoms of this condition include:  Shortness of breath with activity, such as when climbing stairs. A cough that does not go away. Swelling of the feet, ankles, legs, or abdomen. Losing or gaining weight for no reason. Trouble breathing when lying flat. Waking from sleep because of the need to sit up and get more air. Rapid heartbeat. Other symptoms may include: Tiredness (fatigue) and loss of energy. Feeling light-headed, dizzy, or close to fainting. Nausea or loss of appetite. Waking up more often during the night to urinate (nocturia). Confusion. How is this diagnosed? This condition is diagnosed based on: Your medical history, symptoms, and a physical exam. Blood tests. Diagnostic tests, which may include: Echocardiogram. Electrocardiogram (ECG). Chest X-ray. Exercise stress test. Cardiac MRI. Cardiac catheterization and angiogram. Radionuclide scans. How is this treated? Treatment for this condition is aimed at managing the symptoms of heart failure. Medicines Treatment may include medicines that: Help lower blood pressure by relaxing (dilating) the blood vessels. These medicines are called ACE inhibitors (angiotensin-converting enzyme), ARBs (angiotensin receptor blockers), or vasodilators. Cause the kidneys to remove salt and water from the blood through urination (diuretics). Improve heart muscle strength and prevent the heart from beating too fast (beta blockers). Increase the force of the heartbeat (digoxin). Lower heart rates. Certain diabetes medicines (SGLT-2 inhibitors) may also be used in treatment. Healthy behavior changes Treatment may also include making healthy lifestyle changes, such as: Reaching and staying at a healthy  weight. Not using tobacco or nicotine products. Eating heart-healthy foods. Limiting or avoiding alcohol. Stopping the use of illegal drugs. Being physically active. Participating in a cardiac rehabilitation program, which is a treatment program to improve your health and well-being through exercise training, education, and counseling. Other treatments Other treatments may include: Procedures to open blocked arteries or repair damaged valves. Placing a pacemaker to improve heart function (cardiac resynchronization therapy). Placing a device to treat serious abnormal heart rhythms (implantable cardioverter defibrillator, or ICD). Placing a device to improve the pumping ability of the heart (left ventricular assist device, or LVAD). Receiving a healthy heart from a donor (heart transplant). This is done when other treatments have not helped. Follow these instructions at home: Manage other health conditions as told by your health care provider. These may include hypertension, diabetes, thyroid disease, or abnormal heart rhythms. Get ongoing education and support as needed. Learn as much as you can about heart failure. Keep all follow-up visits. This is important. Where to find more information American Heart Association: www.heart.org Centers for Disease Control and Prevention: FootballExhibition.com.br NIH [employer] on Aging: https://walker.com/ Summary Heart failure is a condition in which the heart has trouble pumping blood. This condition is commonly caused by high blood pressure and other diseases of the heart and lungs. Symptoms of this condition include shortness of breath, tiredness (fatigue), nausea, and swelling of the feet, ankles, legs, or abdomen. Treatments for this condition may include medicines, lifestyle changes, and surgery. Manage other health conditions as told by your health care provider. This information is not intended to replace advice given to you by your health care  provider. Make sure you discuss any questions you have with your health care provider. Document Revised: 04/17/2021 Document Reviewed: 07/31/2019 Elsevier Patient Education  [address]    Signed, Sharlene Dory, NP  01/21/2022 9:46 AM    [facility]

## 2022-01-18 NOTE — Patient Instructions (Addendum)
Medication Instructions: Your physician recommends that you continue on your current medications as directed. Please refer to the Current Medication list given to you today.   Labwork: Bmet,CBC   Testing/Procedures: None today  Follow-Up: 6-8 weeks with E.Peck, 6 months Dr.Branch,  Referral to hematology/oncology  Referral to Heart Failure Clinic  Daily Weight Record It is important to weigh yourself daily. To do this: Make sure you use a reliable scale. Use the same scale each day. Keep this daily weight chart near your scale. Weigh yourself each morning at the same time after you use the bathroom. Before weighing yourself: Take off your shoes. Make sure you are wearing the same amount of clothing each day. Write down your weight in the spaces on the form. Compare today's weight to yesterday's weight. Bring this form with you to your follow-up visits with your health care provider. Call your health care provider if you have concerns about your weight, including rapid weight gain or loss. Date: ________ Weight: ____________________ Date: ________ Weight: ____________________ Date: ________ Weight: ____________________ Date: ________ Weight: ____________________ Date: ________ Weight: ____________________ Date: ________ Weight: ____________________ Date: ________ Weight: ____________________ Date: ________ Weight: ____________________ Date: ________ Weight: ____________________ Date: ________ Weight: ____________________ Date: ________ Weight: ____________________ Date: ________ Weight: ____________________ Date: ________ Weight: ____________________ Date: ________ Weight: ____________________ Date: ________ Weight: ____________________ Date: ________ Weight: ____________________ Date: ________ Weight: ____________________ Date: ________ Weight: ____________________ Date: ________ Weight: ____________________ Date: ________ Weight: ____________________ Date: ________ Weight:  ____________________ Date: ________ Weight: ____________________ Date: ________ Weight: ____________________ Date: ________ Weight: ____________________ Date: ________ Weight: ____________________ Date: ________ Weight: ____________________ Date: ________ Weight: ____________________ Date: ________ Weight: ____________________ Date: ________ Weight: ____________________ Date: ________ Weight: ____________________ Date: ________ Weight: ____________________ Date: ________ Weight: ____________________ Date: ________ Weight: ____________________ Date: ________ Weight: ____________________ Date: ________ Weight: ____________________ Date: ________ Weight: ____________________ Date: ________ Weight: ____________________ Date: ________ Weight: ____________________ Date: ________ Weight: ____________________ Date: ________ Weight: ____________________ Date: ________ Weight: ____________________ Date: ________ Weight: ____________________ Date: ________ Weight: ____________________ Date: ________ Weight: ____________________ Date: ________ Weight: ____________________ Date: ________ Weight: ____________________ Date: ________ Weight: ____________________ Date: ________ Weight: ____________________ Date: ________ Weight: ____________________ Date: ________ Weight: ____________________ This information is not intended to replace advice given to you by your health care provider. Make sure you discuss any questions you have with your health care provider. Document Revised: 09/12/2020 Document Reviewed: 09/12/2020 Elsevier Patient Education  [place]. Blood Pressure Record Sheet To take your blood pressure, you will need a blood pressure machine. You may be prescribed one, or you can buy a blood pressure machine (blood pressure monitor) at your clinic, drug store, or online. When choosing one, look for these features: An automatic monitor that has an arm cuff. A cuff that wraps snugly, but not too  tightly, around your upper arm. You should be able to fit only one finger between your arm and the cuff. A device that stores blood pressure reading results. Do not choose a monitor that measures your blood pressure from your wrist or finger. Follow your health care provider's instructions for how to take your blood pressure. To use this form: Get one reading in the morning (a.m.) before you take any medicines. Get one reading in the evening (p.m.) before supper. Take at least two readings with each blood pressure check. This makes sure the results are correct. Wait 1-2 minutes between measurements. Write down the results in the spaces on this form. Repeat this once a week, or as told by your health care provider.  Make a follow-up appointment with your health care provider to discuss the results. Blood pressure log Date: _______________________ a.m. _____________________(1st reading) _____________________(2nd reading) p.m. _____________________(1st reading) _____________________(2nd reading) Date: _______________________ a.m. _____________________(1st reading) _____________________(2nd reading) p.m. _____________________(1st reading) _____________________(2nd reading) Date: _______________________ a.m. _____________________(1st reading) _____________________(2nd reading) p.m. _____________________(1st reading) _____________________(2nd reading) Date: _______________________ a.m. _____________________(1st reading) _____________________(2nd reading) p.m. _____________________(1st reading) _____________________(2nd reading) Date: _______________________ a.m. _____________________(1st reading) _____________________(2nd reading) p.m. _____________________(1st reading) _____________________(2nd reading) This information is not intended to replace advice given to you by your health care provider. Make sure you discuss any questions you have with your health care provider. Document Revised:  09/21/2020 Document Reviewed: 09/21/2020 Elsevier Patient Education  [place], 6 months Dr.Branch  Any Other Special Instructions Will Be Listed Below (If Applicable).  If you need a refill on your cardiac medications before your next appointment, please call your pharmacy. Heart Failure, Diagnosis  Heart failure is a condition in which the heart has trouble pumping blood. This may mean that the heart cannot pump enough blood out to the body or that the heart does not fill up with enough blood. For some people with heart failure, fluid may back up into the lungs. There may also be swelling (edema) in the lower legs. Heart failure is usually a long-term (chronic) condition. It is important for you to take good care of yourself and follow the treatment plan from your health care provider. Different stages of heart failure have different treatment plans. The stages are: Stage A: At risk for heart failure. Having no symptoms of heart failure, but being at risk for developing heart failure. Stage B: Pre-heart failure. Having no symptoms of heart failure, but having structural changes to the heart that indicate heart failure. Stage C: Symptomatic heart failure. Having symptoms of heart failure in addition to structural changes to the heart that indicate heart failure. Stage D: Advanced heart failure. Having symptoms that interfere with daily life and frequent hospitalizations related to heart failure. What are the causes? This condition may be caused by: High blood pressure (hypertension). Hypertension causes the heart muscle to work harder than normal. Coronary artery disease, or CAD. CAD is the buildup of cholesterol and fat (plaque) in the arteries of the heart. Heart attack, also called myocardial infarction. This injures the heart muscle, making it hard for the heart to pump blood. Abnormal heart valves. The valves do not open and close properly, forcing the heart to pump  harder to keep the blood flowing. Heart muscle disease, inflammation, or infection (cardiomyopathy or myocarditis). This is damage to the heart muscle. It can increase the risk of heart failure. Lung disease. The heart works harder when the lungs are not healthy. What increases the risk? The risk of heart failure increases as a person ages. This condition is also more likely to develop in people who: Are obese. Use tobacco or nicotine products. Abuse alcohol or drugs. Have taken medicines that can damage the heart, such as chemotherapy drugs. Have any of these conditions: Diabetes. Abnormal heart rhythms. Thyroid problems. Low blood counts (anemia). Chronic kidney disease. Have a family history of heart failure. What are the signs or symptoms? Symptoms of this condition include: Shortness of breath with activity, such as when climbing stairs. A cough that does not go away. Swelling of the feet, ankles, legs, or abdomen. Losing or gaining weight for no reason. Trouble breathing when lying flat. Waking from sleep because of the need to sit up and get more air. Rapid  heartbeat. Other symptoms may include: Tiredness (fatigue) and loss of energy. Feeling light-headed, dizzy, or close to fainting. Nausea or loss of appetite. Waking up more often during the night to urinate (nocturia). Confusion. How is this diagnosed? This condition is diagnosed based on: Your medical history, symptoms, and a physical exam. Blood tests. Diagnostic tests, which may include: Echocardiogram. Electrocardiogram (ECG). Chest X-ray. Exercise stress test. Cardiac MRI. Cardiac catheterization and angiogram. Radionuclide scans. How is this treated? Treatment for this condition is aimed at managing the symptoms of heart failure. Medicines Treatment may include medicines that: Help lower blood pressure by relaxing (dilating) the blood vessels. These medicines are called ACE inhibitors  (angiotensin-converting enzyme), ARBs (angiotensin receptor blockers), or vasodilators. Cause the kidneys to remove salt and water from the blood through urination (diuretics). Improve heart muscle strength and prevent the heart from beating too fast (beta blockers). Increase the force of the heartbeat (digoxin). Lower heart rates. Certain diabetes medicines (SGLT-2 inhibitors) may also be used in treatment. Healthy behavior changes Treatment may also include making healthy lifestyle changes, such as: Reaching and staying at a healthy weight. Not using tobacco or nicotine products. Eating heart-healthy foods. Limiting or avoiding alcohol. Stopping the use of illegal drugs. Being physically active. Participating in a cardiac rehabilitation program, which is a treatment program to improve your health and well-being through exercise training, education, and counseling. Other treatments Other treatments may include: Procedures to open blocked arteries or repair damaged valves. Placing a pacemaker to improve heart function (cardiac resynchronization therapy). Placing a device to treat serious abnormal heart rhythms (implantable cardioverter defibrillator, or ICD). Placing a device to improve the pumping ability of the heart (left ventricular assist device, or LVAD). Receiving a healthy heart from a donor (heart transplant). This is done when other treatments have not helped. Follow these instructions at home: Manage other health conditions as told by your health care provider. These may include hypertension, diabetes, thyroid disease, or abnormal heart rhythms. Get ongoing education and support as needed. Learn as much as you can about heart failure. Keep all follow-up visits. This is important. Where to find more information American Heart Association: www.heart.org Centers for Disease Control and Prevention: http://www.wolf.info/ NIH National Institute on Aging: http://kim-miller.com/ Summary Heart  failure is a condition in which the heart has trouble pumping blood. This condition is commonly caused by high blood pressure and other diseases of the heart and lungs. Symptoms of this condition include shortness of breath, tiredness (fatigue), nausea, and swelling of the feet, ankles, legs, or abdomen. Treatments for this condition may include medicines, lifestyle changes, and surgery. Manage other health conditions as told by your health care provider. This information is not intended to replace advice given to you by your health care provider. Make sure you discuss any questions you have with your health care provider. Document Revised: 04/17/2021 Document Reviewed: 07/31/2019 Elsevier Patient Education  [place].

## 2022-01-24 ENCOUNTER — Other Ambulatory Visit (HOSPITAL_COMMUNITY): Payer: Self-pay | Admitting: Hematology

## 2022-01-24 ENCOUNTER — Inpatient Hospital Stay: Payer: Medicare HMO | Attending: Hematology | Admitting: Hematology

## 2022-01-24 ENCOUNTER — Inpatient Hospital Stay: Payer: Medicare HMO

## 2022-01-24 ENCOUNTER — Other Ambulatory Visit (HOSPITAL_COMMUNITY)
Admission: RE | Admit: 2022-01-24 | Discharge: 2022-01-24 | Disposition: A | Discharge status: Home / Self Care | Payer: Medicare HMO | Source: Ambulatory Visit | Attending: Nurse Practitioner | Admitting: Nurse Practitioner

## 2022-01-24 VITALS — BP 126/87 | HR 92 | Temp 97.6°F | Resp 19 | Ht 71.0 in | Wt 311.9 lb

## 2022-01-24 DIAGNOSIS — R69 Illness, unspecified: Secondary | ICD-10-CM | POA: Diagnosis not present

## 2022-01-24 DIAGNOSIS — D509 Iron deficiency anemia, unspecified: Secondary | ICD-10-CM | POA: Insufficient documentation

## 2022-01-24 DIAGNOSIS — Z79899 Other long term (current) drug therapy: Secondary | ICD-10-CM | POA: Insufficient documentation

## 2022-01-24 DIAGNOSIS — D72819 Decreased white blood cell count, unspecified: Secondary | ICD-10-CM | POA: Insufficient documentation

## 2022-01-24 DIAGNOSIS — D696 Thrombocytopenia, unspecified: Secondary | ICD-10-CM | POA: Diagnosis not present

## 2022-01-24 DIAGNOSIS — N6310 Unspecified lump in the right breast, unspecified quadrant: Secondary | ICD-10-CM | POA: Diagnosis not present

## 2022-01-24 DIAGNOSIS — F17210 Nicotine dependence, cigarettes, uncomplicated: Secondary | ICD-10-CM

## 2022-01-24 DIAGNOSIS — D500 Iron deficiency anemia secondary to blood loss (chronic): Secondary | ICD-10-CM

## 2022-01-24 DIAGNOSIS — N6452 Nipple discharge: Secondary | ICD-10-CM

## 2022-01-24 DIAGNOSIS — E559 Vitamin D deficiency, unspecified: Secondary | ICD-10-CM

## 2022-01-24 DIAGNOSIS — N63 Unspecified lump in unspecified breast: Secondary | ICD-10-CM | POA: Insufficient documentation

## 2022-01-24 DIAGNOSIS — Z1231 Encounter for screening mammogram for malignant neoplasm of breast: Secondary | ICD-10-CM

## 2022-01-24 DIAGNOSIS — Z809 Family history of malignant neoplasm, unspecified: Secondary | ICD-10-CM | POA: Insufficient documentation

## 2022-01-24 LAB — IRON AND TIBC
Iron: 27 ug/dL — ABNORMAL LOW (ref 28–170)
Saturation Ratios: 6 % — ABNORMAL LOW (ref 10.4–31.8)
TIBC: 459 ug/dL — ABNORMAL HIGH (ref 250–450)
UIBC: 432 ug/dL

## 2022-01-24 LAB — RETICULOCYTES
Immature Retic Fract: 36.4 % — ABNORMAL HIGH (ref 2.3–15.9)
RBC.: 4.13 MIL/uL (ref 3.87–5.11)
Retic Count, Absolute: 68.6 10*3/uL (ref 19.0–186.0)
Retic Ct Pct: 1.7 % (ref 0.4–3.1)

## 2022-01-24 LAB — CBC WITH DIFFERENTIAL/PLATELET
Abs Immature Granulocytes: 0.01 10*3/uL (ref 0.00–0.07)
Basophils Absolute: 0.1 10*3/uL (ref 0.0–0.1)
Basophils Relative: 2 %
Eosinophils Absolute: 0.1 10*3/uL (ref 0.0–0.5)
Eosinophils Relative: 1 %
HCT: 33.5 % — ABNORMAL LOW (ref 36.0–46.0)
Hemoglobin: 9.3 g/dL — ABNORMAL LOW (ref 12.0–15.0)
Immature Granulocytes: 0 %
Lymphocytes Relative: 17 %
Lymphs Abs: 0.8 10*3/uL (ref 0.7–4.0)
MCH: 22.5 pg — ABNORMAL LOW (ref 26.0–34.0)
MCHC: 27.8 g/dL — ABNORMAL LOW (ref 30.0–36.0)
MCV: 80.9 fL (ref 80.0–100.0)
Monocytes Absolute: 0.4 10*3/uL (ref 0.1–1.0)
Monocytes Relative: 9 %
Neutro Abs: 3.1 10*3/uL (ref 1.7–7.7)
Neutrophils Relative %: 71 %
Platelets: 319 10*3/uL (ref 150–400)
RBC: 4.14 MIL/uL (ref 3.87–5.11)
RDW: 27.9 % — ABNORMAL HIGH (ref 11.5–15.5)
WBC: 4.4 10*3/uL (ref 4.0–10.5)
nRBC: 0 % (ref 0.0–0.2)

## 2022-01-24 LAB — CBC
HCT: 32.8 % — ABNORMAL LOW (ref 36.0–46.0)
Hemoglobin: 9.4 g/dL — ABNORMAL LOW (ref 12.0–15.0)
MCH: 23 pg — ABNORMAL LOW (ref 26.0–34.0)
MCHC: 28.7 g/dL — ABNORMAL LOW (ref 30.0–36.0)
MCV: 80.2 fL (ref 80.0–100.0)
Platelets: 329 10*3/uL (ref 150–400)
RBC: 4.09 MIL/uL (ref 3.87–5.11)
RDW: 28 % — ABNORMAL HIGH (ref 11.5–15.5)
WBC: 4.6 10*3/uL (ref 4.0–10.5)
nRBC: 0 % (ref 0.0–0.2)

## 2022-01-24 LAB — VITAMIN B12: Vitamin B-12: 506 pg/mL (ref 180–914)

## 2022-01-24 LAB — BASIC METABOLIC PANEL
Anion gap: 8 (ref 5–15)
BUN: 15 mg/dL (ref 6–20)
CO2: 27 mmol/L (ref 22–32)
Calcium: 8.8 mg/dL — ABNORMAL LOW (ref 8.9–10.3)
Chloride: 99 mmol/L (ref 98–111)
Creatinine, Ser: 1.14 mg/dL — ABNORMAL HIGH (ref 0.44–1.00)
GFR, Estimated: >60 mL/min (ref >60)
Glucose, Bld: 95 mg/dL (ref 70–99)
Potassium: 4.9 mmol/L (ref 3.5–5.1)
Sodium: 134 mmol/L — ABNORMAL LOW (ref 135–145)

## 2022-01-24 LAB — FERRITIN: Ferritin: 43 ng/mL (ref 11–307)

## 2022-01-24 LAB — FOLATE: Folate: 10.4 ng/mL (ref >5.9)

## 2022-01-24 LAB — LACTATE DEHYDROGENASE: LDH: 218 U/L — ABNORMAL HIGH (ref 98–192)

## 2022-01-24 NOTE — Progress Notes (Signed)
CONSULT NOTE  Patient Care Team: Carrolyn Meiers, MD as PCP - General (Internal Medicine) Harl Bowie Alphonse Guild, MD as PCP - Cardiology (Cardiology) Mealor, Yetta Barre, MD as PCP - Electrophysiology (Cardiology) Gala Romney Cristopher Estimable, MD as Consulting Physician (Gastroenterology) Derek Jack, MD as Medical Oncologist (Hematology)  CHIEF COMPLAINTS/PURPOSE OF CONSULTATION:  Pancytopenia and right breast swelling  HISTORY OF PRESENTING ILLNESS:  [name] [name] 47 y.o. female is seen in consultation today at the request of Finis Bud, NP for further evaluation and management of pancytopenia.  CBC on 01/11/2022 shows white count 2.9, hemoglobin 8.5, MCV 77.6 and platelet count 123.  She has leukopenia and thrombocytopenia only since 01/09/2022.  Prior to that her white count was mostly normal.  Even the platelet count was mostly normal prior to 01/09/2022.  However she has prolonged history of iron deficiency anemia with microcytosis.  She had previous colonoscopy in May 2020 which showed nonbleeding internal hemorrhoids and normal colon.  During recent hospitalization, she had hemoglobin dropped down to 7 and required 2 units PRBC.  She also received 2 infusions of nucelit.  She denies any melena or bleeding per rectum.  She has heavy menstrual bleeding, 5 days every 28 days, 3 days are heavy.  LMP is on 12/22/2021.  She also reported right breast swelling which started suddenly in November.  She was treated for cellulitis of the right breast during recent hospitalization with IV antibiotics.  She reports pain in the right breast.  She also reports nipple discharge on and off for the past 6 months, milky.  She lives at home with her mom and is currently on disability.    MEDICAL HISTORY:  Past Medical History:  Diagnosis Date   Abdominal cramps 08/30/2015   Anemia    Blood transfusion without reported diagnosis    CAD (coronary artery disease)    Chronic combined systolic and  diastolic CHF (congestive heart failure) (HCC)    Closed left ankle fracture    August 30 2012   Clotting disorder [facility])    DVT (deep venous thrombosis) (HCC)    L leg   Fibroid tumor    Hidradenitis    High blood pressure    Hypertension    Mitral regurgitation    NICM (nonischemic cardiomyopathy) (HCC)    NSVT (nonsustained ventricular tachycardia) (HCC)    Obesity    OSA on CPAP    Oxygen deficiency    Pregnancy    09/20/14 17 weeks   Pulmonary embolism ([place]) 04/2013   Sleep apnea    Tobacco use disorder     SURGICAL HISTORY: Past Surgical History:  Procedure Laterality Date   ABSCESS DRAINAGE Bilateral 01/10/15   COLONOSCOPY WITH PROPOFOL N/A 06/08/2018   Procedure: COLONOSCOPY WITH PROPOFOL;  Surgeon: Daneil Dolin, MD;  [facility];  Service: Endoscopy;  Laterality: N/A;  2:30pm   CYST REMOVAL TRUNK     IVC FILTER PLACEMENT ([place] [facility])     LOWER EXTREMITY VENOGRAPHY N/A 02/07/2021   Procedure: LOWER EXTREMITY VENOGRAPHY;  Surgeon: [name], MD;  Location: [place] CV LAB;  Service: Cardiovascular;  Laterality: N/A;   PERIPHERAL VASCULAR THROMBECTOMY N/A 02/07/2021   Procedure: PERIPHERAL VASCULAR THROMBECTOMY;  Surgeon: [name], MD;  Location: [place] CV LAB;  Service: Cardiovascular;  Laterality: N/A;   RIGHT/LEFT HEART CATH AND CORONARY ANGIOGRAPHY N/A 06/19/2021   Procedure: RIGHT/LEFT HEART CATH AND CORONARY ANGIOGRAPHY;  Surgeon: Belva Crome, MD;  Location: [place]  CV LAB;  Service: Cardiovascular;  Laterality: N/A;    SOCIAL HISTORY: Social History   Socioeconomic History   Marital status: Widowed    Spouse name: Not on file   Number of children: Not on file   Years of education: Not on file   Highest education level: Not on file  Occupational History   Not on file  Tobacco Use   Smoking status: Some Days    Packs/day: 0.25    Years: 18.00    Total pack years: 4.50    Types: Cigarettes   Smokeless tobacco: Never    Tobacco comments:    smokes 3 cig a day ARJ 02/15/21  Vaping Use   Vaping Use: Never used  Substance and Sexual Activity   Alcohol use: Yes    Alcohol/week: 2.0 standard drinks of alcohol    Types: 2 Cans of beer per week    Comment: twice a month   Drug use: No   Sexual activity: Not Currently    Birth control/protection: Pill, None  Other Topics Concern   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    Has a 46 year old [place].   Live with parents.   Was working 5 days a weeks.   Not working right now.    Attends church.    Social Determinants of Health   Financial Resource Strain: Not on file  Food Insecurity: No Food Insecurity (01/24/2022)   Hunger Vital Sign    Worried About Running Out of Food in the Last Year: Never true    Ran Out of Food in the Last Year: Never true  Transportation Needs: No Transportation Needs (01/24/2022)   PRAPARE - Hydrologist (Medical): No    Lack of Transportation (Non-Medical): No  Physical Activity: Not on file  Stress: Not on file  Social Connections: Not on file  Intimate Partner Violence: Not At Risk (01/24/2022)   Humiliation, Afraid, Rape, and Kick questionnaire    Fear of Current or Ex-Partner: No    Emotionally Abused: No    Physically Abused: No    Sexually Abused: No    FAMILY HISTORY: Family History  Problem Relation Age of Onset   Hypertension Mother    Diabetes Paternal Aunt    Diabetes Paternal Uncle    Other Father        blood clots   Cancer Maternal Grandmother    Heart attack Paternal Grandmother    Colon cancer Neg Hx     ALLERGIES:  is allergic to sulfa antibiotics.  MEDICATIONS:  Current Outpatient Medications  Medication Sig Dispense Refill   albuterol (VENTOLIN HFA) 108 (90 Base) MCG/ACT inhaler Inhale 2 puffs into the lungs every 2 (two) hours as needed for wheezing or shortness of breath. 18 g 11    amoxicillin-clavulanate (AUGMENTIN) 875-125 MG tablet Take 1 tablet by mouth every 12 (twelve) hours. 14 tablet 0   atorvastatin (LIPITOR) 20 MG tablet Take 1 tablet (20 mg total) by mouth daily. (Patient taking differently: Take 20 mg by mouth in the morning.) 90 tablet 3   furosemide (LASIX) 80 MG tablet Take 39m po tid for 5 days then 445mpo bid 60 tablet 3   losartan (COZAAR) 25 MG tablet Take 0.5 tablets (12.5 mg total) by mouth daily. 90 tablet 3   Magnesium Oxide 400 MG CAPS Take 1 capsule (400 mg total) by  mouth daily. (Patient taking differently: Take 400 mg by mouth in the morning.) 90 capsule 3   metoprolol succinate (TOPROL-XL) 25 MG 24 hr tablet Take 1 tablet (25 mg total) by mouth daily. Take with or immediately following a meal. 90 tablet 3   midodrine (PROAMATINE) 2.5 MG tablet Take 1 tablet (2.5 mg total) by mouth 2 (two) times daily with a meal. 60 tablet 0   oxyCODONE (OXY IR/ROXICODONE) 5 MG immediate release tablet Take 1 tablet (5 mg total) by mouth every 6 (six) hours as needed for moderate pain. 20 tablet 0   potassium chloride 20 MEQ TBCR Take 20 mEq by mouth 2 (two) times daily. 90 tablet 3   spironolactone (ALDACTONE) 25 MG tablet Take 0.5 tablets (12.5 mg total) by mouth daily. (Patient taking differently: Take 12.5 mg by mouth in the morning.) 30 tablet 3   warfarin (COUMADIN) 10 MG tablet Take 5-10 mg by mouth See admin instructions. Take as directed in the evening : Monday take 10 mg and all other days take 5 mg.     warfarin (COUMADIN) 5 MG tablet TAKE (1) OR (1) 1/2 TABLET BY MOUTH DAILY AS DIRECTED.     No current facility-administered medications for this visit.   Facility-Administered Medications Ordered in Other Visits  Medication Dose Route Frequency Provider Last Rate Last Admin   0.9 %  sodium chloride infusion   Intravenous Continuous Baird Cancer, PA-C 20 mL/hr at 12/22/15 1430 New Bag at 12/22/15 1430    REVIEW OF SYSTEMS:   Constitutional:  Denies fevers, chills or abnormal night sweats Eyes: Denies blurriness of vision, double vision or watery eyes Ears, nose, mouth, throat, and face: Denies mucositis or sore throat Respiratory: Positive for dyspnea on exertion. Cardiovascular: Denies palpitation, chest discomfort or lower extremity swelling Gastrointestinal:  Denies nausea, heartburn or change in bowel habits Skin: Denies abnormal skin rashes Lymphatics: Denies new lymphadenopathy or easy bruising Neurological:Denies numbness, tingling or new weaknesses Behavioral/Psych: Mood is stable, no new changes  All other systems were reviewed with the patient and are negative.  PHYSICAL EXAMINATION: ECOG PERFORMANCE STATUS: 0 - Asymptomatic  Vitals:   01/24/22 0834  BP: 126/87  Pulse: 92  Resp: 19  Temp: 97.6 F (36.4 C)  SpO2: 100%   Filed Weights   01/24/22 0834  Weight: (!) 311 lb 14.4 oz (141.5 kg)    GENERAL:alert, no distress and comfortable SKIN: skin color, texture, turgor are normal, no rashes or significant lesions EYES: normal, conjunctiva are pink and non-injected, sclera clear OROPHARYNX:no exudate, no erythema and lips, buccal mucosa, and tongue normal  NECK: supple, thyroid normal size, non-tender, without nodularity LYMPH:  no palpable lymphadenopathy in the cervical, axillary or inguinal LUNGS: clear to auscultation and percussion with normal breathing effort HEART: regular rate & rhythm and no murmurs and no lower extremity edema ABDOMEN:abdomen soft, non-tender and normal bowel sounds Musculoskeletal:no cyanosis of digits and no clubbing  PSYCH: alert & oriented x 3 with fluent speech NEURO: no focal motor/sensory deficits Breast: Right breast is very enlarged with orange peel appearance with no erythema or warmth.  There is no clear mass.  There are very small right axillary lymph nodes palpable.  No nipple discharge seen.  LABORATORY DATA:  I have reviewed the data as listed Recent Results  (from the past 2160 hour(s))  POCT INR     Status: Abnormal   Collection Time: 10/29/21  8:33 AM  Result Value Ref Range   INR 1.6 (  A) 2.0 - 3.0  POCT INR     Status: Abnormal   Collection Time: 11/12/21  8:20 AM  Result Value Ref Range   INR 1.7 (A) 2.0 - 3.0   POC INR    CBC     Status: Abnormal   Collection Time: 11/14/21  8:15 AM  Result Value Ref Range   WBC 4.3 4.0 - 10.5 K/uL   RBC 4.26 3.87 - 5.11 MIL/uL   Hemoglobin 8.1 (L) 12.0 - 15.0 g/dL    Comment: Reticulocyte Hemoglobin testing may be clinically indicated, consider ordering this additional test [id number]    HCT 30.0 (L) 36.0 - 46.0 %   MCV 70.4 (L) 80.0 - 100.0 fL   MCH 19.0 (L) 26.0 - 34.0 pg   MCHC 27.0 (L) 30.0 - 36.0 g/dL   RDW 24.4 (H) 11.5 - 15.5 %   Platelets 235 150 - 400 K/uL   nRBC 0.0 0.0 - 0.2 %    Comment: Performed at [facility], [address] [place], [place] [address]  Comprehensive Metabolic Panel (CMET)     Status: Abnormal   Collection Time: 11/14/21  8:15 AM  Result Value Ref Range   Sodium 139 135 - 145 mmol/L   Potassium 3.9 3.5 - 5.1 mmol/L   Chloride 102 98 - 111 mmol/L   CO2 28 22 - 32 mmol/L   Glucose, Bld 100 (H) 70 - 99 mg/dL    Comment: Glucose reference range applies only to samples taken after fasting for at least 8 hours.   BUN 19 6 - 20 mg/dL   Creatinine, Ser 1.15 (H) 0.44 - 1.00 mg/dL   Calcium 9.1 8.9 - 10.3 mg/dL   Total Protein 7.3 6.5 - 8.1 g/dL   Albumin 3.2 (L) 3.5 - 5.0 g/dL   AST 20 15 - 41 U/L   ALT 29 0 - 44 U/L   Alkaline Phosphatase 69 38 - 126 U/L   Total Bilirubin 1.0 0.3 - 1.2 mg/dL   GFR, Estimated 60 (L) >60 mL/min    Comment: (NOTE) Calculated using the CKD-EPI Creatinine Equation (2021)    Anion gap 9 5 - 15    Comment: Performed at [facility], [address], [place], [place] [address]  HIV antibody (with reflex)     Status: None   Collection Time: 11/14/21  8:15 AM  Result Value Ref Range   HIV Screen 4th Generation wRfx Non Reactive Non  Reactive    Comment: Performed at [facility], [place] [address], [place], [place] [address]  Magnesium     Status: None   Collection Time: 11/14/21  8:15 AM  Result Value Ref Range   Magnesium 1.9 1.7 - 2.4 mg/dL    Comment: Performed at [facility], [address], [place], [place] [address]  Lipid panel     Status: Abnormal   Collection Time: 11/14/21  8:16 AM  Result Value Ref Range   Cholesterol 77 0 - 200 mg/dL   Triglycerides 35 <150 mg/dL   HDL 27 (L) >40 mg/dL   Total CHOL/HDL Ratio 2.9 RATIO   VLDL 7 0 - 40 mg/dL   LDL Cholesterol 43 0 - 99 mg/dL    Comment:        Total Cholesterol/HDL:CHD Risk Coronary Heart Disease Risk Table                     Men   Women  1/2 Average Risk  3.4   3.3  Average Risk       5.0   4.4  2 X Average Risk   9.6   7.1  3 X Average Risk  23.4   11.0        Use the calculated Patient Ratio above and the CHD Risk Table to determine the patient's CHD Risk.        ATP III CLASSIFICATION (LDL):  <100     mg/dL   Optimal  100-129  mg/dL   Near or Above                    Optimal  130-159  mg/dL   Borderline  160-189  mg/dL   High  >190     mg/dL   Very High Performed at [facility], [address] [place], [place] [address]   TSH     Status: None   Collection Time: 11/14/21  8:16 AM  Result Value Ref Range   TSH 3.122 0.350 - 4.500 uIU/mL    Comment: Performed by a 3rd Generation assay with a functional sensitivity of <=0.01 uIU/mL. Performed at [facility], [address], [place], [place] [address]   Pregnancy, urine     Status: None   Collection Time: 11/15/21 12:43 PM  Result Value Ref Range   Preg Test, Ur NEGATIVE NEGATIVE    Comment:        THE SENSITIVITY OF THIS METHODOLOGY IS >20 mIU/mL. Performed at [facility], [address] [place], [place] [address]   POCT INR     Status: Abnormal   Collection Time: 11/27/21  8:22 AM  Result Value Ref Range   INR 4.3 (A) 2.0 - 3.0   POC INR    POCT INR     Status: None    Collection Time: 12/11/21  8:17 AM  Result Value Ref Range   INR     POC INR    POCT INR     Status: Abnormal   Collection Time: 12/11/21  8:20 AM  Result Value Ref Range   INR 5.1 (A) 2.0 - 3.0   POC INR    CBC with Differential     Status: Abnormal   Collection Time: 12/16/21  1:57 PM  Result Value Ref Range   WBC 4.3 4.0 - 10.5 K/uL   RBC 4.40 3.87 - 5.11 MIL/uL   Hemoglobin 8.4 (L) 12.0 - 15.0 g/dL    Comment: Reticulocyte Hemoglobin testing may be clinically indicated, consider ordering this additional test [id number]    HCT 30.8 (L) 36.0 - 46.0 %   MCV 70.0 (L) 80.0 - 100.0 fL   MCH 19.1 (L) 26.0 - 34.0 pg   MCHC 27.3 (L) 30.0 - 36.0 g/dL   RDW 22.6 (H) 11.5 - 15.5 %   Platelets 240 150 - 400 K/uL   nRBC 0.0 0.0 - 0.2 %   Neutrophils Relative % 61 %   Neutro Abs 2.7 1.7 - 7.7 K/uL   Lymphocytes Relative 26 %   Lymphs Abs 1.1 0.7 - 4.0 K/uL   Monocytes Relative 8 %   Monocytes Absolute 0.4 0.1 - 1.0 K/uL   Eosinophils Relative 3 %   Eosinophils Absolute 0.1 0.0 - 0.5 K/uL   Basophils Relative 2 %   Basophils Absolute 0.1 0.0 - 0.1 K/uL   WBC Morphology MORPHOLOGY UNREMARKABLE    Smear Review MORPHOLOGY UNREMARKABLE    Immature Granulocytes 0 %   Abs Immature Granulocytes  0.01 0.00 - 0.07 K/uL   Polychromasia PRESENT    Target Cells PRESENT    Spherocytes PRESENT     Comment: Performed at [facility], [address], [place], [place] [address]  Basic metabolic panel     Status: Abnormal   Collection Time: 12/16/21  1:57 PM  Result Value Ref Range   Sodium 132 (L) 135 - 145 mmol/L   Potassium 4.3 3.5 - 5.1 mmol/L   Chloride 100 98 - 111 mmol/L   CO2 24 22 - 32 mmol/L   Glucose, Bld 86 70 - 99 mg/dL    Comment: Glucose reference range applies only to samples taken after fasting for at least 8 hours.   BUN 16 6 - 20 mg/dL   Creatinine, Ser 0.93 0.44 - 1.00 mg/dL   Calcium 8.8 (L) 8.9 - 10.3 mg/dL   GFR, Estimated >60 >60 mL/min    Comment:  (NOTE) Calculated using the CKD-EPI Creatinine Equation (2021)    Anion gap 8 5 - 15    Comment: Performed at [facility], [address], [place], [place] [address]  Resp Panel by RT-PCR (Flu A&B, Covid) Anterior Nasal Swab     Status: None   Collection Time: 12/16/21  2:47 PM   Specimen: Anterior Nasal Swab  Result Value Ref Range   SARS Coronavirus 2 by RT PCR NEGATIVE NEGATIVE    Comment: (NOTE) SARS-CoV-2 target nucleic acids are NOT DETECTED.  The SARS-CoV-2 RNA is generally detectable in upper respiratory specimens during the acute phase of infection. The lowest concentration of SARS-CoV-2 viral copies this assay can detect is 138 copies/mL. A negative result does not preclude SARS-Cov-2 infection and should not be used as the sole basis for treatment or other patient management decisions. A negative result may occur with  improper specimen collection/handling, submission of specimen other than nasopharyngeal swab, presence of viral mutation(s) within the areas targeted by this assay, and inadequate number of viral copies(<138 copies/mL). A negative result must be combined with clinical observations, patient history, and epidemiological information. The expected result is Negative.  Fact Sheet for Patients:  EntrepreneurPulse.com.au  Fact Sheet for Healthcare Providers:  IncredibleEmployment.be  This test is no t yet approved or cleared by the [place] FDA and  has been authorized for detection and/or diagnosis of SARS-CoV-2 by FDA under an Emergency Use Authorization (EUA). This EUA will remain  in effect (meaning this test can be used) for the duration of the COVID-19 declaration under Section 564(b)(1) of the Act, 21 U.S.C.section 360bbb-3(b)(1), unless the authorization is terminated  or revoked sooner.       Influenza A by PCR NEGATIVE NEGATIVE   Influenza B by PCR NEGATIVE NEGATIVE    Comment: (NOTE) The Xpert Xpress  SARS-CoV-2/FLU/RSV plus assay is intended as an aid in the diagnosis of influenza from Nasopharyngeal swab specimens and should not be used as a sole basis for treatment. Nasal washings and aspirates are unacceptable for Xpert Xpress SARS-CoV-2/FLU/RSV testing.  Fact Sheet for Patients: EntrepreneurPulse.com.au  Fact Sheet for Healthcare Providers: IncredibleEmployment.be  This test is not yet approved or cleared by the [place] FDA and has been authorized for detection and/or diagnosis of SARS-CoV-2 by FDA under an Emergency Use Authorization (EUA). This EUA will remain in effect (meaning this test can be used) for the duration of the COVID-19 declaration under Section 564(b)(1) of the Act, 21 U.S.C. section 360bbb-3(b)(1), unless the authorization is terminated or revoked.  Performed at [facility], [id number]  [address], [place], [place] [address]   POCT INR     Status: Abnormal   Collection Time: 12/25/21  8:14 AM  Result Value Ref Range   INR 1.9 (A) 2.0 - 3.0   POC INR    CBC with Differential     Status: Abnormal   Collection Time: 12/28/21  1:30 PM  Result Value Ref Range   WBC 8.1 4.0 - 10.5 K/uL   RBC 4.44 3.87 - 5.11 MIL/uL   Hemoglobin 8.7 (L) 12.0 - 15.0 g/dL    Comment: Reticulocyte Hemoglobin testing may be clinically indicated, consider ordering this additional test [id number]    HCT 31.7 (L) 36.0 - 46.0 %   MCV 71.4 (L) 80.0 - 100.0 fL   MCH 19.6 (L) 26.0 - 34.0 pg   MCHC 27.4 (L) 30.0 - 36.0 g/dL   RDW 23.0 (H) 11.5 - 15.5 %   Platelets 275 150 - 400 K/uL   nRBC 0.0 0.0 - 0.2 %   Neutrophils Relative % 79 %   Neutro Abs 6.5 1.7 - 7.7 K/uL   Lymphocytes Relative 11 %   Lymphs Abs 0.9 0.7 - 4.0 K/uL   Monocytes Relative 7 %   Monocytes Absolute 0.5 0.1 - 1.0 K/uL   Eosinophils Relative 2 %   Eosinophils Absolute 0.1 0.0 - 0.5 K/uL   Basophils Relative 1 %   Basophils Absolute 0.1 0.0 - 0.1 K/uL   WBC Morphology  MORPHOLOGY UNREMARKABLE    Immature Granulocytes 0 %   Abs Immature Granulocytes 0.02 0.00 - 0.07 K/uL   Polychromasia PRESENT    Target Cells PRESENT    Giant PLTs PRESENT     Comment: Performed at [facility], [address], [place], [place] [address]  Basic metabolic panel     Status: Abnormal   Collection Time: 12/28/21  1:30 PM  Result Value Ref Range   Sodium 140 135 - 145 mmol/L   Potassium 4.5 3.5 - 5.1 mmol/L   Chloride 103 98 - 111 mmol/L   CO2 28 22 - 32 mmol/L   Glucose, Bld 89 70 - 99 mg/dL    Comment: Glucose reference range applies only to samples taken after fasting for at least 8 hours.   BUN 17 6 - 20 mg/dL   Creatinine, Ser 0.96 0.44 - 1.00 mg/dL   Calcium 8.8 (L) 8.9 - 10.3 mg/dL   GFR, Estimated >60 >60 mL/min    Comment: (NOTE) Calculated using the CKD-EPI Creatinine Equation (2021)    Anion gap 9 5 - 15    Comment: Performed at [facility], [address] [place], [place] [address]  Lactic acid, plasma     Status: Abnormal   Collection Time: 12/28/21  1:30 PM  Result Value Ref Range   Lactic Acid, Venous 2.2 (HH) 0.5 - 1.9 mmol/L    Comment: CRITICAL RESULT CALLED TO, READ BACK BY AND VERIFIED WITH: T.TALBERT BY LBASTON 1442,12/28/21. Performed at [facility], [address], [place], [place] [address]   Lactic acid, plasma     Status: None   Collection Time: 12/28/21  3:19 PM  Result Value Ref Range   Lactic Acid, Venous 1.9 0.5 - 1.9 mmol/L    Comment: Performed at [facility], [address], [place],  [address]  Type and screen [facility]     Status: None   Collection Time: 12/28/21  5:07 PM  Result Value Ref Range   ABO/RH(D) A POS    Antibody  Screen NEG    Sample Expiration      12/31/2021,2359 Performed at [facility], [address] [place], [place] [address]    Unit Number [id number]    Blood Component Type RBC LR PHER2    Unit division 00    Status of Unit ISSUED,FINAL    Transfusion Status OK TO TRANSFUSE     Crossmatch Result Compatible    Unit Number [id number]    Blood Component Type RED CELLS,LR    Unit division 00    Status of Unit ISSUED,FINAL    Transfusion Status OK TO TRANSFUSE    Crossmatch Result Compatible   Prepare fresh frozen plasma     Status: None   Collection Time: 12/28/21  5:07 PM  Result Value Ref Range   Unit Number [id number]    Blood Component Type FP24 PHR THW    Unit division A0    Status of Unit ISSUED,FINAL    Transfusion Status OK TO TRANSFUSE    Unit Number [id number]    Blood Component Type THAWED PLASMA    Unit division 00    Status of Unit ISSUED,FINAL    Transfusion Status OK TO TRANSFUSE    Unit Number [id number]    Blood Component Type THAWED PLASMA    Unit division 00    Status of Unit ISSUED,FINAL    Transfusion Status      OK TO TRANSFUSE Performed at [facility], [address], [place], [place] [address]   BPAM FFP     Status: None   Collection Time: 12/28/21  5:07 PM  Result Value Ref Range   ISSUE DATE / TIME 355974163845    Blood Product Unit Number [id number]    PRODUCT CODE [id number]    Unit Type and Rh 6200    Blood Product Expiration Date 250037048889    ISSUE DATE / TIME 169450388828    Blood Product Unit Number [id number]    PRODUCT CODE [id number]    Unit Type and Rh 8400    Blood Product Expiration Date 202312132359    ISSUE DATE / TIME 553748270786    Blood Product Unit Number [id number]    PRODUCT CODE [id number]    Unit Type and Rh 2800    Blood Product Expiration Date 197588325498   BPAM RBC     Status: None   Collection Time: 12/28/21  5:07 PM  Result Value Ref Range   ISSUE DATE / TIME 202312111201    Blood Product Unit Number [id number]    PRODUCT CODE [id number]    Unit Type and Rh 1594    Blood Product Expiration Date 585929244628    ISSUE DATE / TIME 638177116579    Blood Product Unit Number [id number]    PRODUCT CODE [id number]    Unit Type and Rh 6200    Blood Product Expiration Date  997741423953   Protime-INR     Status: Abnormal   Collection Time: 12/28/21 11:50 PM  Result Value Ref Range   Prothrombin Time 23.2 (H) 11.4 - 15.2 seconds   INR 2.1 (H) 0.8 - 1.2    Comment: (NOTE) INR goal varies based on device and disease states. Performed at [facility], [address] [place], [place] [address]   Protime-INR     Status: Abnormal   Collection Time: 12/29/21  6:02 AM  Result Value Ref Range   Prothrombin Time 20.8 (H) 11.4 - 15.2 seconds   INR 1.8 (H) 0.8 - 1.2    Comment: (NOTE) INR goal  varies based on device and disease states. Performed at [facility], [address], [place], [place] [address]   Basic metabolic panel     Status: Abnormal   Collection Time: 12/29/21  6:02 AM  Result Value Ref Range   Sodium 136 135 - 145 mmol/L   Potassium 3.8 3.5 - 5.1 mmol/L   Chloride 102 98 - 111 mmol/L   CO2 29 22 - 32 mmol/L   Glucose, Bld 100 (H) 70 - 99 mg/dL    Comment: Glucose reference range applies only to samples taken after fasting for at least 8 hours.   BUN 18 6 - 20 mg/dL   Creatinine, Ser 0.96 0.44 - 1.00 mg/dL   Calcium 8.3 (L) 8.9 - 10.3 mg/dL   GFR, Estimated >60 >60 mL/min    Comment: (NOTE) Calculated using the CKD-EPI Creatinine Equation (2021)    Anion gap 5 5 - 15    Comment: Performed at [facility], [address], [place], [place] [address]  CBC     Status: Abnormal   Collection Time: 12/29/21  6:02 AM  Result Value Ref Range   WBC 7.0 4.0 - 10.5 K/uL   RBC 3.91 3.87 - 5.11 MIL/uL   Hemoglobin 7.5 (L) 12.0 - 15.0 g/dL    Comment: Reticulocyte Hemoglobin testing may be clinically indicated, consider ordering this additional test [id number]    HCT 27.9 (L) 36.0 - 46.0 %   MCV 71.4 (L) 80.0 - 100.0 fL   MCH 19.2 (L) 26.0 - 34.0 pg   MCHC 26.9 (L) 30.0 - 36.0 g/dL   RDW 22.8 (H) 11.5 - 15.5 %   Platelets 224 150 - 400 K/uL   nRBC 0.0 0.0 - 0.2 %    Comment: Performed at [facility], [address] [place], [place] [address]   Prepare fresh frozen plasma     Status: None   Collection Time: 12/29/21  8:26 AM  Result Value Ref Range   Unit Number [id number]    Blood Component Type THW PLS APHR    Unit division A0    Status of Unit ISSUED,FINAL    Transfusion Status      OK TO TRANSFUSE Performed at [facility], [address], [place], [place] [address]    Unit Number [id number]    Blood Component Type THW PLS APHR    Unit division A0    Status of Unit ISSUED,FINAL    Transfusion Status OK TO TRANSFUSE   BPAM FFP     Status: None   Collection Time: 12/29/21  8:26 AM  Result Value Ref Range   ISSUE DATE / TIME [id number]    Blood Product Unit Number [id number]    PRODUCT CODE [id number]    Unit Type and Rh 6200    Blood Product Expiration Date [id number]    ISSUE DATE / TIME [id number]    Blood Product Unit Number [id number]    PRODUCT CODE [id number]    Unit Type and Rh 6200    Blood Product Expiration Date [id number]   Protime-INR     Status: Abnormal   Collection Time: 12/29/21  3:38 PM  Result Value Ref Range   Prothrombin Time 19.2 (H) 11.4 - 15.2 seconds   INR 1.6 (H) 0.8 - 1.2    Comment: (NOTE) INR goal varies based on device and disease states. Performed at [facility], [address], [place], [place] [address]   Surgical pathology     Status: None   Collection  Time: 12/29/21  4:03 PM  Result Value Ref Range   SURGICAL PATHOLOGY      SURGICAL PATHOLOGY CASE: [id number] PATIENT: [name] [name] Surgical Pathology Report     Clinical History: Punch biopsy of right breast (jmc)     FINAL MICROSCOPIC DIAGNOSIS:  A. SKIN, RIGHT BREAST, PUNCH BIOPSY: - Benign breast parenchyma with acute and chronic inflammation - Unremarkable skin - No malignancy identified   GROSS DESCRIPTION:  Received in formalin (TIF unknown) are 5 irregular pieces of gray-white to yellow soft to indurated tissues which range from 0.2 to 0.8 cm in greatest dimension, with few  pieces including dark brown skin on 1 aspect.  In toto, one block.  SW 12/31/2021   Final Diagnosis performed by Tilford Pillar, MD.   Electronically signed 01/01/2022 Technical component performed at [facility], [place] [address] [place], [place] [address].  Professional component performed at [facility], [place] [address] [place], [place] [address].  Immunohistochemistry Technical component (if [employer]) was performed at [facility] [address] [place], [place], [place] [address].   IMMUNOHISTOCHEMISTRY DISCLAIMER (if applicable): Some of these immunohistochemical stains may have been developed and the performance characteristics determine by [facility]. Some may not have been cleared or approved by the U.S. Food and Drug Administration. The FDA has determined that such clearance or approval is not necessary. This test is used for clinical purposes. It should not be regarded as investigational or for research. This laboratory is certified under the [place] (CLIA-88) as qualified to perform high complexity clinical laboratory testing.  The controls stained appropriately.   CBC     Status: Abnormal   Collection Time: 12/30/21  5:12 AM  Result Value Ref Range   WBC 7.7 4.0 - 10.5 K/uL   RBC 3.89 3.87 - 5.11 MIL/uL   Hemoglobin 7.6 (L) 12.0 - 15.0 g/dL    Comment: Reticulocyte Hemoglobin testing may be clinically indicated, consider ordering this additional test [id number]    HCT 27.7 (L) 36.0 - 46.0 %   MCV 71.2 (L) 80.0 - 100.0 fL   MCH 19.5 (L) 26.0 - 34.0 pg   MCHC 27.4 (L) 30.0 - 36.0 g/dL   RDW 22.6 (H) 11.5 - 15.5 %   Platelets 215 150 - 400 K/uL   nRBC 0.3 (H) 0.0 - 0.2 %    Comment: Performed at [facility], [address], [place], [place] [address]  Basic metabolic panel     Status: Abnormal   Collection Time: 12/30/21  5:12 AM  Result Value Ref  Range   Sodium 134 (L) 135 - 145 mmol/L   Potassium 3.7 3.5 - 5.1 mmol/L   Chloride 100 98 - 111 mmol/L   CO2 29 22 - 32 mmol/L   Glucose, Bld 81 70 - 99 mg/dL    Comment: Glucose reference range applies only to samples taken after fasting for at least 8 hours.   BUN 17 6 - 20 mg/dL   Creatinine, Ser 1.03 (H) 0.44 - 1.00 mg/dL   Calcium 8.4 (L) 8.9 - 10.3 mg/dL   GFR, Estimated >60 >60 mL/min    Comment: (NOTE) Calculated using the CKD-EPI Creatinine Equation (2021)    Anion gap 5 5 - 15    Comment: Performed at [facility], [address], [place], [place] [address]  Protime-INR     Status: Abnormal   Collection Time: 12/30/21  5:12 AM  Result Value  Ref Range   Prothrombin Time 21.2 (H) 11.4 - 15.2 seconds   INR 1.9 (H) 0.8 - 1.2    Comment: (NOTE) INR goal varies based on device and disease states. Performed at [facility], [address], [place], [place] [address]   Heparin level (unfractionated)     Status: Abnormal   Collection Time: 12/30/21 10:49 PM  Result Value Ref Range   Heparin Unfractionated 0.25 (L) 0.30 - 0.70 IU/mL    Comment: (NOTE) The clinical reportable range upper limit is being lowered to >1.10 to align with the FDA approved guidance for the current laboratory assay.  If heparin results are below expected values, and patient dosage has  been confirmed, suggest follow up testing of antithrombin III levels. Performed at [name], [address], [place], [place] [address]   CBC     Status: Abnormal   Collection Time: 12/30/21 10:49 PM  Result Value Ref Range   WBC 6.1 4.0 - 10.5 K/uL   RBC 4.21 3.87 - 5.11 MIL/uL   Hemoglobin 8.2 (L) 12.0 - 15.0 g/dL    Comment: Reticulocyte Hemoglobin testing may be clinically indicated, consider ordering this additional test [id number]    HCT 30.1 (L) 36.0 - 46.0 %   MCV 71.5 (L) 80.0 - 100.0 fL   MCH 19.5 (L) 26.0 - 34.0 pg   MCHC 27.2 (L) 30.0 - 36.0 g/dL   RDW 22.8 (H) 11.5 - 15.5 %   Platelets 279 150  - 400 K/uL   nRBC 0.0 0.0 - 0.2 %    Comment: Performed at [facility], [address], [place], [place] [address]  Basic metabolic panel     Status: Abnormal   Collection Time: 12/31/21  4:29 AM  Result Value Ref Range   Sodium 133 (L) 135 - 145 mmol/L   Potassium 3.1 (L) 3.5 - 5.1 mmol/L   Chloride 99 98 - 111 mmol/L   CO2 26 22 - 32 mmol/L   Glucose, Bld 118 (H) 70 - 99 mg/dL    Comment: Glucose reference range applies only to samples taken after fasting for at least 8 hours.   BUN 20 6 - 20 mg/dL   Creatinine, Ser 1.08 (H) 0.44 - 1.00 mg/dL   Calcium 7.9 (L) 8.9 - 10.3 mg/dL   GFR, Estimated >60 >60 mL/min    Comment: (NOTE) Calculated using the CKD-EPI Creatinine Equation (2021)    Anion gap 8 5 - 15    Comment: Performed at [facility], [address], [place], [place] [address]  CBC     Status: Abnormal   Collection Time: 12/31/21  4:29 AM  Result Value Ref Range   WBC 5.8 4.0 - 10.5 K/uL   RBC 3.63 (L) 3.87 - 5.11 MIL/uL   Hemoglobin 7.0 (L) 12.0 - 15.0 g/dL    Comment: Reticulocyte Hemoglobin testing may be clinically indicated, consider ordering this additional test [id number]    HCT 25.7 (L) 36.0 - 46.0 %   MCV 70.8 (L) 80.0 - 100.0 fL   MCH 19.3 (L) 26.0 - 34.0 pg   MCHC 27.2 (L) 30.0 - 36.0 g/dL   RDW 22.5 (H) 11.5 - 15.5 %   Platelets 236 150 - 400 K/uL   nRBC 0.0 0.0 - 0.2 %    Comment: Performed at [facility], [address] [place], [place] [address]  Magnesium     Status: None   Collection Time: 12/31/21  4:29 AM  Result Value Ref Range  Magnesium 1.8 1.7 - 2.4 mg/dL    Comment: Performed at [facility], [address], [place], [place] [address]  Heparin level (unfractionated)     Status: None   Collection Time: 12/31/21  6:33 AM  Result Value Ref Range   Heparin Unfractionated 0.33 0.30 - 0.70 IU/mL    Comment: (NOTE) The clinical reportable range upper limit is being lowered to >1.10 to align with the FDA approved guidance for the current  laboratory assay.  If heparin results are below expected values, and patient dosage has  been confirmed, suggest follow up testing of antithrombin III levels. Performed at [facility], [address] [place], [place] [address]   Retic Panel     Status: Abnormal   Collection Time: 12/31/21  6:33 AM  Result Value Ref Range   Retic Ct Pct 1.7 0.4 - 3.1 %   RBC. 3.75 (L) 3.87 - 5.11 MIL/uL   Retic Count, Absolute 64.5 19.0 - 186.0 K/uL   Immature Retic Fract 30.4 (H) 2.3 - 15.9 %   Reticulocyte Hemoglobin 20.0 (L) >27.9 pg    Comment:        A RET-He < 28 pg is an indication of iron-deficient or iron- insufficient erythropoiesis. Patients with thalassemia may also have a decreased RET-He result unrelated to iron availability.     If this patient has chronic kidney disease and does not have a hemoglobinopathy he/she meets criteria for iron deficiency per the 2016 NICE guidelines. Refer to specific guidelines to determine the appropriate thresholds for treating CKD- associated iron deficiency. TSAT and ferritin should be used in patients with hemoglobinopathies (e.g. thalassemia). Performed at [facility], [address] [place], [place] [address]   Vitamin B12     Status: None   Collection Time: 12/31/21  7:34 AM  Result Value Ref Range   Vitamin B-12 436 180 - 914 pg/mL    Comment: (NOTE) This assay is not validated for testing neonatal or myeloproliferative syndrome specimens for Vitamin B12 levels. Performed at [facility], [address], [place], [place] [address]   Folate     Status: None   Collection Time: 12/31/21  7:34 AM  Result Value Ref Range   Folate 7.1 >5.9 ng/mL    Comment: Performed at [facility], [address], [place], [place] [address]  Iron and TIBC     Status: Abnormal   Collection Time: 12/31/21  7:34 AM  Result Value Ref Range   Iron 22 (L) 28 - 170 ug/dL   TIBC 385 250 - 450 ug/dL   Saturation Ratios 6 (L) 10.4 - 31.8 %   UIBC 363 ug/dL     Comment: Performed at [facility], [address], [place], [place] [address]  Ferritin     Status: None   Collection Time: 12/31/21  7:34 AM  Result Value Ref Range   Ferritin 11 11 - 307 ng/mL    Comment: Performed at [facility], [address], [place], [place] [address]  Prepare RBC (crossmatch)     Status: None   Collection Time: 12/31/21  8:11 AM  Result Value Ref Range   Order Confirmation      ORDER PROCESSED BY BLOOD BANK Performed at [facility], [address] [place],  [address]   Vancomycin, trough     Status: None   Collection Time: 12/31/21  8:11 AM  Result Value Ref Range   Vancomycin Tr 17 15 - 20 ug/mL    Comment: Performed at  [address] [place], [place] [address]  Heparin level (unfractionated)     Status: Abnormal   Collection Time: 12/31/21  8:00 PM  Result Value Ref Range   Heparin Unfractionated 0.20 (L) 0.30 - 0.70 IU/mL    Comment: (NOTE) The clinical reportable range upper limit is being lowered to >1.10 to align with the FDA approved guidance for the current laboratory assay.  If heparin results are below expected values, and patient dosage has  been confirmed, suggest follow up testing of antithrombin III levels. Performed at [facility], [address], [place], [place] [address]   Basic metabolic panel     Status: Abnormal   Collection Time: 01/01/22  6:44 AM  Result Value Ref Range   Sodium 136 135 - 145 mmol/L   Potassium 4.1 3.5 - 5.1 mmol/L    Comment: DELTA CHECK NOTED   Chloride 99 98 - 111 mmol/L   CO2 29 22 - 32 mmol/L   Glucose, Bld 94 70 - 99 mg/dL    Comment: Glucose reference range applies only to samples taken after fasting for at least 8 hours.   BUN 18 6 - 20 mg/dL   Creatinine, Ser 1.10 (H) 0.44 - 1.00 mg/dL   Calcium 8.4 (L) 8.9 - 10.3 mg/dL   GFR, Estimated >60 >60 mL/min    Comment: (NOTE) Calculated using the CKD-EPI Creatinine Equation (2021)    Anion gap 8 5 - 15    Comment: Performed  at [facility], [address] [place], [place] [address]  CBC     Status: Abnormal   Collection Time: 01/01/22  6:44 AM  Result Value Ref Range   WBC 5.6 4.0 - 10.5 K/uL   RBC 4.17 3.87 - 5.11 MIL/uL   Hemoglobin 8.6 (L) 12.0 - 15.0 g/dL    Comment: Reticulocyte Hemoglobin testing may be clinically indicated, consider ordering this additional test [id number]    HCT 30.6 (L) 36.0 - 46.0 %   MCV 73.4 (L) 80.0 - 100.0 fL   MCH 20.6 (L) 26.0 - 34.0 pg   MCHC 28.1 (L) 30.0 - 36.0 g/dL   RDW 23.0 (H) 11.5 - 15.5 %   Platelets 249 150 - 400 K/uL   nRBC 0.0 0.0 - 0.2 %    Comment: Performed at [facility], [address], [place], [place] [address]  Magnesium     Status: None   Collection Time: 01/01/22  6:44 AM  Result Value Ref Range   Magnesium 2.1 1.7 - 2.4 mg/dL    Comment: Performed at [facility], [address], [place], [place] [address]  Heparin level (unfractionated)     Status: None   Collection Time: 01/01/22  6:44 AM  Result Value Ref Range   Heparin Unfractionated 0.40 0.30 - 0.70 IU/mL    Comment: (NOTE) The clinical reportable range upper limit is being lowered to >1.10 to align with the FDA approved guidance for the current laboratory assay.  If heparin results are below expected values, and patient dosage has  been confirmed, suggest follow up testing of antithrombin III levels. Performed at [facility], [address], [place], [place] [address]   Aerobic/Anaerobic Culture w Gram Stain (surgical/deep wound)     Status: None   Collection Time: 01/01/22 12:22 PM   Specimen: Abscess  Result Value Ref Range   Specimen Description ABSCESS    Special Requests  BREAST RIGHT    Gram Stain      ABUNDANT WBC  PRESENT, PREDOMINANTLY PMN MODERATE GRAM POSITIVE COCCI FEW GRAM NEGATIVE RODS Performed at [facility], [place] [address], [place], [place] [address]    Culture      MIXED ANAEROBIC FLORA PRESENT.  CALL LAB IF FURTHER IID REQUIRED.   Report Status  01/05/2022 FINAL   Heparin level (unfractionated)     Status: None   Collection Time: 01/01/22  9:50 PM  Result Value Ref Range   Heparin Unfractionated 0.42 0.30 - 0.70 IU/mL    Comment: (NOTE) The clinical reportable range upper limit is being lowered to >1.10 to align with the FDA approved guidance for the current laboratory assay.  If heparin results are below expected values, and patient dosage has  been confirmed, suggest follow up testing of antithrombin III levels. Performed at [facility], [address] [place], [place] [address]   Basic metabolic panel     Status: Abnormal   Collection Time: 01/02/22  4:48 AM  Result Value Ref Range   Sodium 135 135 - 145 mmol/L   Potassium 3.8 3.5 - 5.1 mmol/L   Chloride 103 98 - 111 mmol/L   CO2 27 22 - 32 mmol/L   Glucose, Bld 92 70 - 99 mg/dL    Comment: Glucose reference range applies only to samples taken after fasting for at least 8 hours.   BUN 18 6 - 20 mg/dL   Creatinine, Ser 1.12 (H) 0.44 - 1.00 mg/dL   Calcium 8.5 (L) 8.9 - 10.3 mg/dL   GFR, Estimated >60 >60 mL/min    Comment: (NOTE) Calculated using the CKD-EPI Creatinine Equation (2021)    Anion gap 5 5 - 15    Comment: Performed at [facility], [address] [place], [place] [address]  CBC     Status: Abnormal   Collection Time: 01/02/22  4:48 AM  Result Value Ref Range   WBC 4.7 4.0 - 10.5 K/uL   RBC 4.36 3.87 - 5.11 MIL/uL   Hemoglobin 9.0 (L) 12.0 - 15.0 g/dL   HCT 32.5 (L) 36.0 - 46.0 %   MCV 74.5 (L) 80.0 - 100.0 fL   MCH 20.6 (L) 26.0 - 34.0 pg   MCHC 27.7 (L) 30.0 - 36.0 g/dL   RDW 23.6 (H) 11.5 - 15.5 %   Platelets 245 150 - 400 K/uL   nRBC 0.0 0.0 - 0.2 %    Comment: Performed at [facility], [address], [place], [place] [address]  Heparin level (unfractionated)     Status: None   Collection Time: 01/02/22  4:48 AM  Result Value Ref Range   Heparin Unfractionated 0.59 0.30 - 0.70 IU/mL    Comment: (NOTE) The clinical reportable range upper  limit is being lowered to >1.10 to align with the FDA approved guidance for the current laboratory assay.  If heparin results are below expected values, and patient dosage has  been confirmed, suggest follow up testing of antithrombin III levels. Performed at [facility], [address] [place], [place] [address]   Basic metabolic panel     Status: Abnormal   Collection Time: 01/03/22  3:25 AM  Result Value Ref Range   Sodium 133 (L) 135 - 145 mmol/L   Potassium 3.7 3.5 - 5.1 mmol/L   Chloride 101 98 - 111 mmol/L   CO2 25 22 - 32 mmol/L   Glucose, Bld 93 70 - 99 mg/dL    Comment: Glucose reference range applies only to samples taken after fasting for at least  8 hours.   BUN 17 6 - 20 mg/dL   Creatinine, Ser 1.07 (H) 0.44 - 1.00 mg/dL   Calcium 8.4 (L) 8.9 - 10.3 mg/dL   GFR, Estimated >60 >60 mL/min    Comment: (NOTE) Calculated using the CKD-EPI Creatinine Equation (2021)    Anion gap 7 5 - 15    Comment: Performed at [facility], [address], [place], [place] [address]  CBC     Status: Abnormal   Collection Time: 01/03/22  3:25 AM  Result Value Ref Range   WBC 3.8 (L) 4.0 - 10.5 K/uL   RBC 4.07 3.87 - 5.11 MIL/uL   Hemoglobin 8.5 (L) 12.0 - 15.0 g/dL    Comment: Reticulocyte Hemoglobin testing may be clinically indicated, consider ordering this additional test [id number]    HCT 30.5 (L) 36.0 - 46.0 %   MCV 74.9 (L) 80.0 - 100.0 fL   MCH 20.9 (L) 26.0 - 34.0 pg   MCHC 27.9 (L) 30.0 - 36.0 g/dL   RDW 24.4 (H) 11.5 - 15.5 %   Platelets 241 150 - 400 K/uL   nRBC 0.0 0.0 - 0.2 %    Comment: Performed at [facility], [address], [place], [place] [address]  Heparin level (unfractionated)     Status: None   Collection Time: 01/03/22  3:25 AM  Result Value Ref Range   Heparin Unfractionated 0.58 0.30 - 0.70 IU/mL    Comment: (NOTE) The clinical reportable range upper limit is being lowered to >1.10 to align with the FDA approved guidance for the current  laboratory assay.  If heparin results are below expected values, and patient dosage has  been confirmed, suggest follow up testing of antithrombin III levels. Performed at [facility], [address], [place], [place] [address]   Protime-INR     Status: Abnormal   Collection Time: 01/03/22  3:25 AM  Result Value Ref Range   Prothrombin Time 17.1 (H) 11.4 - 15.2 seconds   INR 1.4 (H) 0.8 - 1.2    Comment: (NOTE) INR goal varies based on device and disease states. Performed at [facility], [address] [place], [place] [address]   Brain natriuretic peptide     Status: Abnormal   Collection Time: 01/03/22  3:25 AM  Result Value Ref Range   B Natriuretic Peptide 1,232.0 (H) 0.0 - 100.0 pg/mL    Comment: Performed at [facility], [address], [place],  [address]  Basic metabolic panel     Status: Abnormal   Collection Time: 01/04/22  3:59 AM  Result Value Ref Range   Sodium 136 135 - 145 mmol/L   Potassium 3.7 3.5 - 5.1 mmol/L   Chloride 103 98 - 111 mmol/L   CO2 26 22 - 32 mmol/L   Glucose, Bld 87 70 - 99 mg/dL    Comment: Glucose reference range applies only to samples taken after fasting for at least 8 hours.   BUN 15 6 - 20 mg/dL   Creatinine, Ser 0.97 0.44 - 1.00 mg/dL   Calcium 8.7 (L) 8.9 - 10.3 mg/dL   GFR, Estimated >60 >60 mL/min    Comment: (NOTE) Calculated using the CKD-EPI Creatinine Equation (2021)    Anion gap 7 5 - 15    Comment: Performed at [facility], [address] [place],  [address]  CBC     Status: Abnormal   Collection Time: 01/04/22  3:59 AM  Result Value Ref Range   WBC 4.3  4.0 - 10.5 K/uL   RBC 3.90 3.87 - 5.11 MIL/uL   Hemoglobin 8.2 (L) 12.0 - 15.0 g/dL    Comment: Reticulocyte Hemoglobin testing may be clinically indicated, consider ordering this additional test [id number]    HCT 28.9 (L) 36.0 - 46.0 %   MCV 74.1 (L) 80.0 - 100.0 fL   MCH 21.0 (L) 26.0 - 34.0 pg   MCHC 28.4 (L) 30.0 - 36.0 g/dL   RDW 25.2 (H) 11.5 -  15.5 %   Platelets 193 150 - 400 K/uL   nRBC 0.0 0.0 - 0.2 %    Comment: Performed at [facility], [address] [place], [place] [address]  Heparin level (unfractionated)     Status: None   Collection Time: 01/04/22  3:59 AM  Result Value Ref Range   Heparin Unfractionated 0.53 0.30 - 0.70 IU/mL    Comment: (NOTE) The clinical reportable range upper limit is being lowered to >1.10 to align with the FDA approved guidance for the current laboratory assay.  If heparin results are below expected values, and patient dosage has  been confirmed, suggest follow up testing of antithrombin III levels. Performed at [facility], [address] [place], [place] [address]   Protime-INR     Status: Abnormal   Collection Time: 01/04/22  3:59 AM  Result Value Ref Range   Prothrombin Time 19.6 (H) 11.4 - 15.2 seconds   INR 1.7 (H) 0.8 - 1.2    Comment: (NOTE) INR goal varies based on device and disease states. Performed at [facility], [address] [place] [address], [place], [place] [address]   Magnesium     Status: None   Collection Time: 01/04/22  3:59 AM  Result Value Ref Range   Magnesium 2.0 1.7 - 2.4 mg/dL    Comment: Performed at [facility], [address] [place], [place] [address]  Vancomycin, trough     Status: None   Collection Time: 01/04/22  4:33 PM  Result Value Ref Range   Vancomycin Tr 15 15 - 20 ug/mL    Comment: Performed at [facility], [address], [place], [place] [address]  Vancomycin, trough     Status: None   Collection Time: 01/05/22  6:25 AM  Result Value Ref Range   Vancomycin Tr 17 15 - 20 ug/mL    Comment: Performed at [facility], [address], [place], [place] [address]  Heparin level (unfractionated)     Status: None   Collection Time: 01/05/22  6:25 AM  Result Value Ref Range   Heparin Unfractionated 0.63 0.30 - 0.70 IU/mL    Comment: (NOTE) The clinical reportable range upper limit is being lowered to >1.10 to align with the FDA approved guidance for the  current laboratory assay.  If heparin results are below expected values, and patient dosage has  been confirmed, suggest follow up testing of antithrombin III levels. Performed at [facility], [address], [place], [place] [address]   Protime-INR     Status: Abnormal   Collection Time: 01/05/22  6:25 AM  Result Value Ref Range   Prothrombin Time 19.3 (H) 11.4 - 15.2 seconds   INR 1.6 (H) 0.8 - 1.2    Comment: (NOTE) INR goal varies based on device and disease states. Performed at [facility], [address] [place], [place] [address]   Heparin level (unfractionated)     Status: None   Collection Time: 01/06/22  5:14 AM  Result Value Ref Range   Heparin Unfractionated  0.62 0.30 - 0.70 IU/mL    Comment: (NOTE) The clinical reportable range upper limit is being lowered to >1.10 to align with the FDA approved guidance for the current laboratory assay.  If heparin results are below expected values, and patient dosage has  been confirmed, suggest follow up testing of antithrombin III levels. Performed at [facility], [address] [place], [place] [address]   Protime-INR     Status: Abnormal   Collection Time: 01/06/22  5:14 AM  Result Value Ref Range   Prothrombin Time 20.2 (H) 11.4 - 15.2 seconds   INR 1.7 (H) 0.8 - 1.2    Comment: (NOTE) INR goal varies based on device and disease states. Performed at [facility], [address], [place], [place] [address]   Basic metabolic panel     Status: Abnormal   Collection Time: 01/06/22  5:14 AM  Result Value Ref Range   Sodium 135 135 - 145 mmol/L   Potassium 3.5 3.5 - 5.1 mmol/L   Chloride 102 98 - 111 mmol/L   CO2 26 22 - 32 mmol/L   Glucose, Bld 82 70 - 99 mg/dL    Comment: Glucose reference range applies only to samples taken after fasting for at least 8 hours.   BUN 13 6 - 20 mg/dL   Creatinine, Ser 0.93 0.44 - 1.00 mg/dL   Calcium 8.7 (L) 8.9 - 10.3 mg/dL   GFR, Estimated >60 >60 mL/min    Comment:  (NOTE) Calculated using the CKD-EPI Creatinine Equation (2021)    Anion gap 7 5 - 15    Comment: Performed at [facility], [address] [place], [place] [address]  Magnesium     Status: None   Collection Time: 01/06/22  5:14 AM  Result Value Ref Range   Magnesium 1.9 1.7 - 2.4 mg/dL    Comment: Performed at [facility], [address], [place], [place] [address]  CBC     Status: Abnormal   Collection Time: 01/06/22  5:14 AM  Result Value Ref Range   WBC 4.2 4.0 - 10.5 K/uL   RBC 4.02 3.87 - 5.11 MIL/uL   Hemoglobin 8.5 (L) 12.0 - 15.0 g/dL    Comment: Reticulocyte Hemoglobin testing may be clinically indicated, consider ordering this additional test [id number]    HCT 30.7 (L) 36.0 - 46.0 %   MCV 76.4 (L) 80.0 - 100.0 fL   MCH 21.1 (L) 26.0 - 34.0 pg   MCHC 27.7 (L) 30.0 - 36.0 g/dL   RDW 26.5 (H) 11.5 - 15.5 %   Platelets 159 150 - 400 K/uL   nRBC 0.0 0.0 - 0.2 %    Comment: Performed at [facility], [address], [place], [place] [address]  Heparin level (unfractionated)     Status: None   Collection Time: 01/07/22  4:33 AM  Result Value Ref Range   Heparin Unfractionated 0.55 0.30 - 0.70 IU/mL    Comment: (NOTE) The clinical reportable range upper limit is being lowered to >1.10 to align with the FDA approved guidance for the current laboratory assay.  If heparin results are below expected values, and patient dosage has  been confirmed, suggest follow up testing of antithrombin III levels. Performed at [facility], [address] [place],  [address]   Protime-INR     Status: Abnormal   Collection Time: 01/07/22  4:33 AM  Result Value Ref Range   Prothrombin Time 22.6 (H) 11.4 - 15.2 seconds   INR 2.0 (  H) 0.8 - 1.2    Comment: (NOTE) INR goal varies based on device and disease states. Performed at [facility], [address] [place], [place] [address]   Basic metabolic panel     Status: Abnormal   Collection Time: 01/07/22  4:33 AM  Result Value Ref  Range   Sodium 134 (L) 135 - 145 mmol/L   Potassium 3.7 3.5 - 5.1 mmol/L   Chloride 103 98 - 111 mmol/L   CO2 23 22 - 32 mmol/L   Glucose, Bld 81 70 - 99 mg/dL    Comment: Glucose reference range applies only to samples taken after fasting for at least 8 hours.   BUN 14 6 - 20 mg/dL   Creatinine, Ser 0.93 0.44 - 1.00 mg/dL   Calcium 8.4 (L) 8.9 - 10.3 mg/dL   GFR, Estimated >60 >60 mL/min    Comment: (NOTE) Calculated using the CKD-EPI Creatinine Equation (2021)    Anion gap 8 5 - 15    Comment: Performed at [facility], [address], [place], [place] [address]  Magnesium     Status: None   Collection Time: 01/07/22  4:33 AM  Result Value Ref Range   Magnesium 1.9 1.7 - 2.4 mg/dL    Comment: Performed at [facility], [address], [place], [place] [address]  Heparin level (unfractionated)     Status: Abnormal   Collection Time: 01/08/22  5:08 AM  Result Value Ref Range   Heparin Unfractionated 0.72 (H) 0.30 - 0.70 IU/mL    Comment: (NOTE) The clinical reportable range upper limit is being lowered to >1.10 to align with the FDA approved guidance for the current laboratory assay.  If heparin results are below expected values, and patient dosage has  been confirmed, suggest follow up testing of antithrombin III levels. Performed at [facility], [address] [place], [place] [address]   Protime-INR     Status: Abnormal   Collection Time: 01/08/22  5:08 AM  Result Value Ref Range   Prothrombin Time 21.5 (H) 11.4 - 15.2 seconds   INR 1.9 (H) 0.8 - 1.2    Comment: (NOTE) INR goal varies based on device and disease states. Performed at [facility], [address], [place], [place] [address]   CBC     Status: Abnormal   Collection Time: 01/08/22  5:08 AM  Result Value Ref Range   WBC 4.3 4.0 - 10.5 K/uL   RBC 3.95 3.87 - 5.11 MIL/uL   Hemoglobin 8.5 (L) 12.0 - 15.0 g/dL    Comment: Reticulocyte Hemoglobin testing may be clinically indicated, consider ordering this  additional test [id number]    HCT 30.0 (L) 36.0 - 46.0 %   MCV 75.9 (L) 80.0 - 100.0 fL   MCH 21.5 (L) 26.0 - 34.0 pg   MCHC 28.3 (L) 30.0 - 36.0 g/dL   RDW 27.3 (H) 11.5 - 15.5 %   Platelets 136 (L) 150 - 400 K/uL   nRBC 0.0 0.0 - 0.2 %    Comment: Performed at [facility], [address], [place], [place] [address]  Basic metabolic panel     Status: Abnormal   Collection Time: 01/08/22  5:08 AM  Result Value Ref Range   Sodium 133 (L) 135 - 145 mmol/L   Potassium 4.0 3.5 - 5.1 mmol/L   Chloride 103 98 - 111 mmol/L   CO2 22 22 - 32 mmol/L   Glucose, Bld 82 70 - 99 mg/dL    Comment:  Glucose reference range applies only to samples taken after fasting for at least 8 hours.   BUN 15 6 - 20 mg/dL   Creatinine, Ser 1.02 (H) 0.44 - 1.00 mg/dL   Calcium 8.4 (L) 8.9 - 10.3 mg/dL   GFR, Estimated >60 >60 mL/min    Comment: (NOTE) Calculated using the CKD-EPI Creatinine Equation (2021)    Anion gap 8 5 - 15    Comment: Performed at [facility], [address], [place], [place] [address]  Magnesium     Status: None   Collection Time: 01/08/22  5:08 AM  Result Value Ref Range   Magnesium 2.0 1.7 - 2.4 mg/dL    Comment: Performed at [facility], [address] [place], [place] [address]  Culture, blood (Routine X 2) w Reflex to ID Panel     Status: None   Collection Time: 01/08/22  5:54 PM   Specimen: BLOOD  Result Value Ref Range   Specimen Description BLOOD BLOOD RIGHT HAND    Special Requests      BOTTLES DRAWN AEROBIC AND ANAEROBIC Blood Culture adequate volume   Culture      NO GROWTH 5 DAYS Performed at [facility], [address] [place], [place] [address]    Report Status 01/13/2022 FINAL   Culture, blood (Routine X 2) w Reflex to ID Panel     Status: None   Collection Time: 01/08/22  5:57 PM   Specimen: BLOOD  Result Value Ref Range   Specimen Description BLOOD BLOOD LEFT HAND    Special Requests      BOTTLES DRAWN AEROBIC AND ANAEROBIC Blood Culture adequate volume    Culture      NO GROWTH 5 DAYS Performed at [facility], [address] [place], [place] [address]    Report Status 01/13/2022 FINAL   Heparin level (unfractionated)     Status: Abnormal   Collection Time: 01/09/22  5:07 AM  Result Value Ref Range   Heparin Unfractionated 0.76 (H) 0.30 - 0.70 IU/mL    Comment: (NOTE) The clinical reportable range upper limit is being lowered to >1.10 to align with the FDA approved guidance for the current laboratory assay.  If heparin results are below expected values, and patient dosage has  been confirmed, suggest follow up testing of antithrombin III levels. Performed at [facility], [address] [place], [place] [address]   Protime-INR     Status: Abnormal   Collection Time: 01/09/22  5:07 AM  Result Value Ref Range   Prothrombin Time 26.8 (H) 11.4 - 15.2 seconds   INR 2.5 (H) 0.8 - 1.2    Comment: (NOTE) INR goal varies based on device and disease states. Performed at [facility], [address] [place], [place] [address]   CBC     Status: Abnormal   Collection Time: 01/09/22  5:07 AM  Result Value Ref Range   WBC 3.1 (L) 4.0 - 10.5 K/uL   RBC 3.81 (L) 3.87 - 5.11 MIL/uL   Hemoglobin 8.3 (L) 12.0 - 15.0 g/dL    Comment: Reticulocyte Hemoglobin testing may be clinically indicated, consider ordering this additional test [id number]    HCT 29.1 (L) 36.0 - 46.0 %   MCV 76.4 (L) 80.0 - 100.0 fL   MCH 21.8 (L) 26.0 - 34.0 pg   MCHC 28.5 (L) 30.0 - 36.0 g/dL   RDW 28.0 (H) 11.5 - 15.5 %   Platelets 109 (L) 150 - 400 K/uL   nRBC 0.0 0.0 -  0.2 %    Comment: Performed at [facility], [address], [place], [place] [address]  Basic metabolic panel     Status: Abnormal   Collection Time: 01/09/22  5:07 AM  Result Value Ref Range   Sodium 133 (L) 135 - 145 mmol/L   Potassium 4.0 3.5 - 5.1 mmol/L   Chloride 104 98 - 111 mmol/L   CO2 21 (L) 22 - 32 mmol/L   Glucose, Bld 76 70 - 99 mg/dL    Comment: Glucose reference range applies only  to samples taken after fasting for at least 8 hours.   BUN 16 6 - 20 mg/dL   Creatinine, Ser 1.09 (H) 0.44 - 1.00 mg/dL   Calcium 8.4 (L) 8.9 - 10.3 mg/dL   GFR, Estimated >60 >60 mL/min    Comment: (NOTE) Calculated using the CKD-EPI Creatinine Equation (2021)    Anion gap 8 5 - 15    Comment: Performed at [facility], [address], [place], [place] [address]  Magnesium     Status: None   Collection Time: 01/09/22  5:07 AM  Result Value Ref Range   Magnesium 2.0 1.7 - 2.4 mg/dL    Comment: Performed at [facility], [address], [place], [place] [address]  Protime-INR     Status: Abnormal   Collection Time: 01/10/22  4:45 AM  Result Value Ref Range   Prothrombin Time 23.9 (H) 11.4 - 15.2 seconds   INR 2.2 (H) 0.8 - 1.2    Comment: (NOTE) INR goal varies based on device and disease states. Performed at [facility], [address] [place], [place] [address]   CBC     Status: Abnormal   Collection Time: 01/10/22  4:45 AM  Result Value Ref Range   WBC 3.1 (L) 4.0 - 10.5 K/uL   RBC 3.72 (L) 3.87 - 5.11 MIL/uL   Hemoglobin 8.0 (L) 12.0 - 15.0 g/dL    Comment: Reticulocyte Hemoglobin testing may be clinically indicated, consider ordering this additional test [id number]    HCT 28.5 (L) 36.0 - 46.0 %   MCV 76.6 (L) 80.0 - 100.0 fL   MCH 21.5 (L) 26.0 - 34.0 pg   MCHC 28.1 (L) 30.0 - 36.0 g/dL   RDW 28.7 (H) 11.5 - 15.5 %   Platelets 122 (L) 150 - 400 K/uL   nRBC 0.0 0.0 - 0.2 %    Comment: Performed at [facility], [address], [place], [place] [address]  Basic metabolic panel     Status: Abnormal   Collection Time: 01/10/22  4:45 AM  Result Value Ref Range   Sodium 133 (L) 135 - 145 mmol/L   Potassium 4.2 3.5 - 5.1 mmol/L   Chloride 102 98 - 111 mmol/L   CO2 23 22 - 32 mmol/L   Glucose, Bld 85 70 - 99 mg/dL    Comment: Glucose reference range applies only to samples taken after fasting for at least 8 hours.   BUN 17 6 - 20 mg/dL   Creatinine, Ser 1.10 (H) 0.44 -  1.00 mg/dL   Calcium 8.6 (L) 8.9 - 10.3 mg/dL   GFR, Estimated >60 >60 mL/min    Comment: (NOTE) Calculated using the CKD-EPI Creatinine Equation (2021)    Anion gap 8 5 - 15    Comment: Performed at [facility], [address] [place], [place] [address]  Protime-INR     Status: Abnormal   Collection Time: 01/11/22  4:19 AM  Result Value Ref  Range   Prothrombin Time 23.8 (H) 11.4 - 15.2 seconds   INR 2.1 (H) 0.8 - 1.2    Comment: (NOTE) INR goal varies based on device and disease states. Performed at [facility], [address] [place], [place] [address]   CBC     Status: Abnormal   Collection Time: 01/11/22  4:19 AM  Result Value Ref Range   WBC 2.9 (L) 4.0 - 10.5 K/uL   RBC 3.89 3.87 - 5.11 MIL/uL   Hemoglobin 8.5 (L) 12.0 - 15.0 g/dL    Comment: Reticulocyte Hemoglobin testing may be clinically indicated, consider ordering this additional test [id number]    HCT 30.2 (L) 36.0 - 46.0 %   MCV 77.6 (L) 80.0 - 100.0 fL   MCH 21.9 (L) 26.0 - 34.0 pg   MCHC 28.1 (L) 30.0 - 36.0 g/dL   RDW 28.9 (H) 11.5 - 15.5 %   Platelets 123 (L) 150 - 400 K/uL   nRBC 0.0 0.0 - 0.2 %    Comment: Performed at [facility], [address], [place], [place] [address]  Basic metabolic panel     Status: Abnormal   Collection Time: 01/11/22  4:19 AM  Result Value Ref Range   Sodium 132 (L) 135 - 145 mmol/L   Potassium 4.0 3.5 - 5.1 mmol/L   Chloride 100 98 - 111 mmol/L   CO2 26 22 - 32 mmol/L   Glucose, Bld 79 70 - 99 mg/dL    Comment: Glucose reference range applies only to samples taken after fasting for at least 8 hours.   BUN 20 6 - 20 mg/dL   Creatinine, Ser 1.18 (H) 0.44 - 1.00 mg/dL   Calcium 8.5 (L) 8.9 - 10.3 mg/dL   GFR, Estimated 58 (L) >60 mL/min    Comment: (NOTE) Calculated using the CKD-EPI Creatinine Equation (2021)    Anion gap 6 5 - 15    Comment: Performed at [facility], [address] [place],  [address]  POCT INR     Status: Normal   Collection Time: 01/16/22   8:14 AM  Result Value Ref Range   INR 2.0 2.0 - 3.0   POC INR      RADIOGRAPHIC STUDIES: I have personally reviewed the radiological images as listed and agreed with the findings in the report. CT FEMUR LEFT WO CONTRAST  Result Date: 01/04/2022 CLINICAL DATA:  Acute left thigh pain. Evaluate for hematoma. On IV heparin. EXAM: CT OF THE LOWER LEFT EXTREMITY WITHOUT CONTRAST TECHNIQUE: Multidetector CT imaging of the lower left extremity was performed according to the standard protocol. RADIATION DOSE REDUCTION: This exam was performed according to the departmental dose-optimization program which includes automated exposure control, adjustment of the mA and/or kV according to patient size and/or use of iterative reconstruction technique. COMPARISON:  None Available. FINDINGS: Bones/Joint/Cartilage There is no acute fracture or focal osseous lesion. There is no dislocation. Joint spaces are well maintained. There is no joint effusion identified. Ligaments Suboptimally assessed by CT. Muscles and Tendons Deep fascial planes are preserved. No evidence for fluid collection or hematoma identified. Soft tissues There is diffuse subcutaneous edema with skin thickening of the lateral left thigh. There is also body wall edema. There is no foreign body, fluid collection or hematoma identified. Ascites is present. IMPRESSION: 1. No acute osseous abnormality. 2. Diffuse subcutaneous edema with skin thickening of the lateral left thigh. No evidence for fluid collection or hematoma. 3. Body wall edema. 4. Ascites. Electronically Signed  By: Ronney Asters M.D.   On: 01/04/2022 18:47   US BREAST ASPIRATION RIGHT  Result Date: 01/01/2022 INDICATION: RIGHT BREAST MASTITIS, UNDERLYING ABSCESS, RECENT BIOPSY EXAM: Ultrasound right breast needle aspiration MEDICATIONS: 1% lidocaine local ANESTHESIA/SEDATION: Moderate (conscious) sedation was employed during this procedure. A total of Versed 2.0 mg and Fentanyl 100 mcg  was administered intravenously by the radiology nurse. Total intra-service moderate Sedation Time: 17 minutes. The patient's level of consciousness and vital signs were monitored continuously by radiology nursing throughout the procedure under my direct supervision. COMPLICATIONS: None immediate. PROCEDURE: Informed written consent was obtained from the patient after a thorough discussion of the procedural risks, benefits and alternatives. All questions were addressed. Maximal Sterile Barrier Technique was utilized including caps, mask, sterile gowns, sterile gloves, sterile drape, hand hygiene and skin antiseptic. A timeout was performed prior to the initiation of the procedure. Previous imaging reviewed. Preliminary on performed. Complex retroareolar hypoechoic fluid pocket was localized at the 6 o'clock position. Overlying skin marked. Under sterile conditions and local anesthesia, the Yueh sheath needle was advanced into the hypoechoic fluid collection with direct ultrasound guidance. Needle position confirmed with ultrasound. Images obtained for documentation. 8 cc purulent fluid aspirated. Sample sent for culture. Survey of the breast demonstrates diffuse edema. No additional large fluid pocket localized by ultrasound for additional aspiration or drainage. IMPRESSION: Ultrasound-guided right breast abscess needle aspiration. Electronically Signed   By: Jerilynn Mages.  Shick M.D.   On: 01/01/2022 15:59   US BREAST LTD UNI RIGHT INC AXILLA  Result Date: 12/31/2021 CLINICAL DATA:  RIGHT breast mass. Patient had ultrasound-guided core biopsy of mass in the retroareolar RIGHT breast performed 02/01/2020, showing benign concordant acute and chronic inflammation. Surgical consultation was recommended for discordant findings. More recently, patient presented to the ED secondary to complaints of worsening right breast pain and swelling. She was admitted for further workup. She underwent a punch biopsy of the right breast on  12/29/2021; results are pending. Per discussion with the technologist, patient has active drainage of purulent fluid from the MEDIAL aspect of the RIGHT breast, discrete from the biopsy site. EXAM: ULTRASOUND OF THE RIGHT BREAST COMPARISON:  02/01/2020 FINDINGS: Targeted ultrasound is performed, showing marked skin thickening in the circumareolar and retroareolar regions of the RIGHT breast. A discrete hypoechoic collection is contiguous with the thickened skin in the immediate retroareolar region. This collection is 1.8 x 2.7 x 2.0 centimeters. Considerations include inflammatory changes or abscess. IMPRESSION: Suspect retroareolar abscess measuring 2.7 centimeters. RECOMMENDATION: Consider ultrasound-guided aspiration of right breast. Recommend annual screening mammography. Last bilateral mammogram was performed 01/20/2020. BI-RADS CATEGORY  2: Benign. These results will be called to the ordering clinician or representative by the Radiologist Assistant, and communication documented in the PACS or [employer]. Electronically Signed   By: Nolon Nations M.D.   On: 12/31/2021 12:23   ASSESSMENT:  1.  Pancytopenia: - Patient seen at the request of Finis Bud, NP for pancytopenia. - CBC (01/11/2022): WBC-2.9, PLT-123, Hb-8.5, MCV-77.6 - Colonoscopy (06/08/2018): Nonbleeding internal hemorrhoids.  Otherwise normal exam. - Received 2 units PRBC on 03/03/2021, status post iron infusion (Nulecit) x 2 - Iron deficiency from menorrhagia (5/28 days, 3 days heavy bleeding)  2.  Social/family history: - Lives at home with her mom.  She is currently on disability.  She works as a Community education officer.  She is currently smoking 3 cigarettes/day.  She used to smoke 1 pack/day for 6 years. - Paternal aunt had anemia requiring blood transfusions.  Paternal  first cousin has sickle cell anemia.  No history of malignancies.  3.  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  Severe iron deficiency anemia: - She has iron deficiency anemia from menorrhagia. - Will check CBC today and ferritin and iron panel. - Will schedule for iron infusion based on the labs.  2.  Leukopenia and thrombocytopenia: - She had leukopenia and thrombocytopenia for 3 days, likely from antibiotics. - Will check Z66, folic acid, MMA and copper levels.  3.  Right breast swelling with orange peel appearance: - She was recently admitted to the hospital from 12/28/2021 through 01/11/2022 with cellulitis of the breast treated with IV vancomycin after failure on oral Augmentin. - US breast (03/03/2021): Retroareolar abscess 2.7 cm - Right breast skin punch biopsy (12/29/2021): Benign breast parenchyma with acute on chronic inflammation.  No malignancy. - Right breast 2:00 biopsy (02/01/2020): Acute and chronic inflammation consistent with abscess with no carcinoma identified.  Right axillary lymph node biopsy showed scant nodal tissue with pigmented histiocytes. - Physical exam today shows orange peel appearance of the right breast with no erythema or discharge. - We will order breast mammogram with ultrasound.  If it is not clear, will consider MRI of the breast with and without contrast. - RTC after mammogram.      All questions were answered. The patient knows to call the clinic with any problems, questions or concerns.      Derek Jack, MD 01/24/22 9:44 AM

## 2022-01-24 NOTE — Patient Instructions (Addendum)
[facility]  Discharge Instructions  You were seen and examined today by Dr. Delton Coombes. Dr. Delton Coombes is a hematologist, meaning that he specializes in blood abnormalities. Dr. Delton Coombes discussed your past medical history, family history of cancers/blood conditions and the events that led to you being here today.  You were referred back to Dr. Delton Coombes due to low blood counts.  Dr. Delton Coombes has recommended additional labs today for further evaluation.  Follow-up as scheduled.  Thank you for choosing [facility] to provide your oncology and hematology care.   To afford each patient quality time with our provider, please arrive at least 15 minutes before your scheduled appointment time. You may need to reschedule your appointment if you arrive late (10 or more minutes). Arriving late affects you and other patients whose appointments are after yours.  Also, if you miss three or more appointments without notifying the office, you may be dismissed from the clinic at the provider's discretion.    Again, thank you for choosing [facility].  Our hope is that these requests will decrease the amount of time that you wait before being seen by our physicians.   If you have a lab appointment with the [facility] please come in thru the Main Entrance and check in at the main information desk.           _____________________________________________________________  Should you have questions after your visit to [facility], please contact our office at [phone] and follow the prompts.  Our office hours are 8:00 a.m. to 4:30 p.m. Monday - Thursday and 8:00 a.m. to 2:30 p.m. Friday.  Please note that voicemails left after 4:00 p.m. may not be returned until the following business day.  We are closed weekends and all major holidays.  You do have access to a nurse 24-7, just call the main number to the clinic  [phone] and do not press any options, hold on the line and a nurse will answer the phone.    For prescription refill requests, have your pharmacy contact our office and allow 72 hours.    Masks are optional in the cancer centers. If you would like for your care team to wear a mask while they are taking care of you, please let them know. You may have one support person who is at least 46 years old accompany you for your appointments.

## 2022-01-25 LAB — HAPTOGLOBIN: Haptoglobin: 91 mg/dL (ref 42–296)

## 2022-01-27 LAB — METHYLMALONIC ACID, SERUM: Methylmalonic Acid, Quantitative: 295 nmol/L (ref 0–378)

## 2022-01-28 LAB — COPPER, SERUM: Copper: 169 ug/dL — ABNORMAL HIGH (ref 80–158)

## 2022-01-30 ENCOUNTER — Encounter (HOSPITAL_COMMUNITY): Payer: Self-pay | Admitting: Cardiology

## 2022-01-30 ENCOUNTER — Other Ambulatory Visit (HOSPITAL_COMMUNITY): Payer: Self-pay

## 2022-01-30 ENCOUNTER — Ambulatory Visit (HOSPITAL_COMMUNITY)
Admission: RE | Admit: 2022-01-30 | Discharge: 2022-01-30 | Disposition: A | Discharge status: Home / Self Care | Payer: Medicare HMO | Source: Ambulatory Visit | Attending: Cardiology | Admitting: Cardiology

## 2022-01-30 ENCOUNTER — Encounter (HOSPITAL_COMMUNITY): Payer: Self-pay | Admitting: Hematology

## 2022-01-30 VITALS — BP 104/70 | HR 64 | Wt 310.6 lb

## 2022-01-30 DIAGNOSIS — D61818 Other pancytopenia: Secondary | ICD-10-CM

## 2022-01-30 DIAGNOSIS — I2510 Atherosclerotic heart disease of native coronary artery without angina pectoris: Secondary | ICD-10-CM | POA: Insufficient documentation

## 2022-01-30 DIAGNOSIS — I5020 Unspecified systolic (congestive) heart failure: Secondary | ICD-10-CM | POA: Diagnosis not present

## 2022-01-30 DIAGNOSIS — I5181 Takotsubo syndrome: Secondary | ICD-10-CM | POA: Insufficient documentation

## 2022-01-30 DIAGNOSIS — R0602 Shortness of breath: Secondary | ICD-10-CM | POA: Diagnosis not present

## 2022-01-30 DIAGNOSIS — I110 Hypertensive heart disease with heart failure: Secondary | ICD-10-CM | POA: Insufficient documentation

## 2022-01-30 DIAGNOSIS — Z86718 Personal history of other venous thrombosis and embolism: Secondary | ICD-10-CM | POA: Diagnosis not present

## 2022-01-30 DIAGNOSIS — Z6841 Body Mass Index (BMI) 40.0 and over, adult: Secondary | ICD-10-CM | POA: Diagnosis not present

## 2022-01-30 DIAGNOSIS — I5042 Chronic combined systolic (congestive) and diastolic (congestive) heart failure: Secondary | ICD-10-CM | POA: Diagnosis not present

## 2022-01-30 DIAGNOSIS — Z79899 Other long term (current) drug therapy: Secondary | ICD-10-CM | POA: Diagnosis not present

## 2022-01-30 DIAGNOSIS — Z86711 Personal history of pulmonary embolism: Secondary | ICD-10-CM | POA: Diagnosis not present

## 2022-01-30 DIAGNOSIS — I340 Nonrheumatic mitral (valve) insufficiency: Secondary | ICD-10-CM | POA: Diagnosis not present

## 2022-01-30 DIAGNOSIS — I5022 Chronic systolic (congestive) heart failure: Secondary | ICD-10-CM | POA: Diagnosis not present

## 2022-01-30 LAB — BASIC METABOLIC PANEL
Anion gap: 10 (ref 5–15)
BUN: 20 mg/dL (ref 6–20)
CO2: 26 mmol/L (ref 22–32)
Calcium: 9 mg/dL (ref 8.9–10.3)
Chloride: 99 mmol/L (ref 98–111)
Creatinine, Ser: 1.16 mg/dL — ABNORMAL HIGH (ref 0.44–1.00)
GFR, Estimated: 59 mL/min — ABNORMAL LOW (ref >60)
Glucose, Bld: 76 mg/dL (ref 70–99)
Potassium: 5.3 mmol/L — ABNORMAL HIGH (ref 3.5–5.1)
Sodium: 135 mmol/L (ref 135–145)

## 2022-01-30 LAB — BRAIN NATRIURETIC PEPTIDE: B Natriuretic Peptide: 1677 pg/mL — ABNORMAL HIGH (ref 0.0–100.0)

## 2022-01-30 MED ORDER — EMPAGLIFLOZIN 10 MG PO TABS: 10.0000 mg | ORAL_TABLET | Freq: Every day | ORAL | 3 refills | Status: DC

## 2022-01-30 NOTE — Patient Instructions (Signed)
START Jardinace 10 mg daily.  Labs done today, your results will be available in MyChart, we will contact you for abnormal readings.  You are scheduled for a Cardiac Catheterization on Friday, February 16 with Dr.  Daniel Nones .  1. Please arrive at the [facility] (Main Entrance A) at [facility]: [address] [place], [place] [address] at 7:00 AM (This time is two hours before your procedure to ensure your preparation). Free valet parking service is available.   Special note: Every effort is made to have your procedure done on time. Please understand that emergencies sometimes delay scheduled procedures.  2. Diet: Do not eat solid foods after midnight.  The patient may have clear liquids until 5am upon the day of the procedure.  3. Medication instructions in preparation for your procedure:   Contrast Allergy: No  HOLD Jardiance, Lasix and Spironolactone the morning of your procedure.  On the morning of your procedure, take your  morning medicines NOT listed above.  You may use sips of water.  5. Plan for one night stay--bring personal belongings. 6. Bring a current list of your medications and current insurance cards. 7. You MUST have a responsible person to drive you home. 8. Someone MUST be with you the first 24 hours after you arrive home or your discharge will be delayed. 9. Please wear clothes that are easy to get on and off and wear slip-on shoes.  You are scheduled for a TEE (Transesophageal Echocardiogram) on Friday, February 16 with Dr. Daniel Nones.    Your physician recommends that you schedule a follow-up appointment as scheduled.  If you have any questions or concerns before your next appointment please send [place] a message through [place] or call our office at [phone].    TO LEAVE A MESSAGE FOR THE NURSE SELECT OPTION 2, PLEASE LEAVE A MESSAGE INCLUDING: YOUR NAME DATE OF BIRTH CALL BACK NUMBER REASON FOR CALL**this is important as we prioritize the call  backs  YOU WILL RECEIVE A CALL BACK THE SAME DAY AS LONG AS YOU CALL BEFORE 4:00 PM  At the [facility], you and your health needs are our priority. As part of our continuing mission to provide you with exceptional heart care, we have created designated Provider Care Teams. These Care Teams include your primary Cardiologist (physician) and Advanced Practice Providers (APPs- Physician Assistants and Nurse Practitioners) who all work together to provide you with the care you need, when you need it.   You may see any of the following providers on your designated Care Team at your next follow up: Dr Glori Bickers Dr Loralie Champagne Dr. Roxana Hires, NP Lyda Jester, [place] [facility] Learned, [place] Forestine Na, NP Audry Riles, PharmD   Please be sure to bring in all your medications bottles to every appointment.

## 2022-01-30 NOTE — Progress Notes (Signed)
ADVANCED HEART FAILURE CLINIC NOTE  Referring Physician: Carrolyn Meiers*  Primary Care: Carrolyn Meiers, MD Primary Cardiologist: Carlyle Dolly, MD EP: Doralee Albino, MD  HPI: [name] [name] is a 47 y.o. female with hx of PE (2015), unprovoked DVT, HFrEF, pancytopenia, nonobstructive CAD, with frequent admissions for shortness of breath 2/2 HFrEF exacerbations, morbid obesity presenting today to establish care.   According to Ms. Marchena her functional decline and most of her cardiac history dates back to 2014/2015 when she was diagnosed with unprovoked DVT & PE. Based on chart review, she had a saddle PE extending from the distal main PA to left and right pulmonary arteries w extension to subsegmental arteries. Prior to that she was fairly ambulatory. After her PE she noticed a significant decline in ability to ambulate leading to further weight gain. She was seen by [facility] hematology where hypercoagulable work-up was negative and she was started on life long anticoagulation. In the interim she also had an IVC filter placed that later could not be retrieved due to extensive thrombus burden (2015). Over the past 1 year she has had fairly poor functional status. She has a 6 year daughter that she is trying to take care of but becomes dyspneic after walking > 10-15 feet. In 5/23 she was admitted with acute hypoxic respiratory failure requiring intubation. LHC during that admit w/ nonobstructive CAD. She was admitted again in 9/23 with syncope, nausea and C2 fracture managed conservatively. LVEF during that admit noted to be 35% w/ severe posterior MR. Otherwise she had one additional hospitalization for acute on chronic HFrEF exacerbation. In addition, she is struggling with pancytopenia; anemia believed to be secondary to severe menorrhagia (followed by hematology).   Today, [name] reports that her functional status is 'stable'. She can perform all ADLs independently, however,  becomes SOB when walking more than 15-23f. She is adhering to a strict low sodium diet with improvement in volume management and LE edema as a result. She is motivated and would like to be able to take care of her young daughter.   Activity level/exercise tolerance:  NYHA III Orthopnea:  Sleeps on 3 pillows Paroxysmal noctural dyspnea:  No Chest pain/pressure:  no Orthostatic lightheadedness:  minimal Palpitations:  no Lower extremity edema:  improved, 1+ Presyncope/syncope:  improved Cough:  No  Past Medical History:  Diagnosis Date   Abdominal cramps 08/30/2015   Anemia    Blood transfusion without reported diagnosis    CAD (coronary artery disease)    Chronic combined systolic and diastolic CHF (congestive heart failure) ([facility]    Closed left ankle fracture    August 30 2012   Clot[facility] ([facility]    DVT (deep venous thrombosis) (HCC)    L leg   Fibroid tumor    Hidradenitis    High blood pressure    Hypertension    Mitral regurgitation    NICM (nonischemic cardiomyopathy) (HCC)    NSVT (nonsustained ventricular tachycardia) (HCC)    Obesity    OSA on CPAP    Oxygen deficiency    Pregnancy    09/20/14 17 weeks   [facility]   Sleep apnea    Tobacco use disorder     Current Outpatient Medications  Medication Sig Dispense Refill   albuterol (VENTOLIN HFA) 108 (90 Base) MCG/ACT inhaler Inhale 2 puffs into the lungs every 2 (two) hours as needed for wheezing or shortness of breath. 18 g 11   atorvastatin (LIPITOR) 20  MG tablet Take 1 tablet (20 mg total) by mouth daily. 90 tablet 3   empagliflozin (JARDIANCE) 10 MG TABS tablet Take 1 tablet (10 mg total) by mouth daily before breakfast. 90 tablet 3   furosemide (LASIX) 40 MG tablet Take 40 mg by mouth 2 (two) times daily.     losartan (COZAAR) 25 MG tablet Take 0.5 tablets (12.5 mg total) by mouth daily. 90 tablet 3   Magnesium Oxide 400 MG CAPS Take 1 capsule (400 mg total) by mouth daily. 90  capsule 3   metoprolol succinate (TOPROL-XL) 25 MG 24 hr tablet Take 1 tablet (25 mg total) by mouth daily. Take with or immediately following a meal. 90 tablet 3   midodrine (PROAMATINE) 2.5 MG tablet Take 1 tablet (2.5 mg total) by mouth 2 (two) times daily with a meal. 60 tablet 0   oxyCODONE (OXY IR/ROXICODONE) 5 MG immediate release tablet Take 1 tablet (5 mg total) by mouth every 6 (six) hours as needed for moderate pain. 20 tablet 0   potassium chloride 20 MEQ TBCR Take 20 mEq by mouth 2 (two) times daily. 90 tablet 3   spironolactone (ALDACTONE) 25 MG tablet Take 0.5 tablets (12.5 mg total) by mouth daily. 30 tablet 3   warfarin (COUMADIN) 10 MG tablet Take 5-10 mg by mouth See admin instructions. Take as directed in the evening : Monday take 10 mg and all other days take 5 mg.     warfarin (COUMADIN) 5 MG tablet TAKE (1) OR (1) 1/2 TABLET BY MOUTH DAILY AS DIRECTED.     No current facility-administered medications for this encounter.   Facility-Administered Medications Ordered in Other Encounters  Medication Dose Route Frequency Provider Last Rate Last Admin   0.9 %  sodium chloride infusion   Intravenous Continuous Baird Cancer, PA-C 20 mL/hr at 12/22/15 1430 New Bag at 12/22/15 1430    Allergies  Allergen Reactions   Sulfa Antibiotics Itching, Swelling and Rash    Lip swelling      Social History   Socioeconomic History   Marital status: Widowed    Spouse name: Not on file   Number of children: Not on file   Years of education: Not on file   Highest education level: Not on file  Occupational History   Not on file  Tobacco Use   Smoking status: Some Days    Packs/day: 0.25    Years: 18.00    Total pack years: 4.50    Types: Cigarettes   Smokeless tobacco: Never   Tobacco comments:    smokes 3 cig a day ARJ 02/15/21  Vaping Use   Vaping Use: Never used  Substance and Sexual Activity   Alcohol use: Yes    Alcohol/week: 2.0 standard drinks of alcohol     Types: 2 Cans of beer per week    Comment: twice a month   Drug use: No   Sexual activity: Not Currently    Birth control/protection: Pill, None  Other Topics Concern   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    Has a 46 year old [place].   Live with parents.   Was working 5 days a weeks.   Not working right now.    Attends church.    Social Determinants of Health   Financial Resource Strain: Not on file  Food Insecurity: No Food Insecurity (01/24/2022)   Hunger Vital Sign  Worried About Charity fundraiser in the Last Year: Never true    [place] in the Last Year: Never true  Transportation Needs: No Transportation Needs (01/24/2022)   PRAPARE - Hydrologist (Medical): No    Lack of Transportation (Non-Medical): No  Physical Activity: Not on file  Stress: Not on file  Social Connections: Not on file  Intimate Partner Violence: Not At Risk (01/24/2022)   Humiliation, Afraid, Rape, and Kick questionnaire    Fear of Current or Ex-Partner: No    Emotionally Abused: No    Physically Abused: No    Sexually Abused: No      Family History  Problem Relation Age of Onset   Hypertension Mother    Diabetes Paternal Aunt    Diabetes Paternal Uncle    Other Father        blood clots   Cancer Maternal Grandmother    Heart attack Paternal Grandmother    Colon cancer Neg Hx     PHYSICAL EXAM: Vitals:   01/30/22 1058  BP: 104/70  Pulse: 64  SpO2: 98%   GENERAL: morbidly obese, in wheel chair today  HEENT: Negative for arcus senilis or xanthelasma. There is no scleral icterus.  The mucous membranes are pink and moist.   NECK: Supple, No masses. Normal carotid upstrokes without bruits. No masses or thyromegaly.    CHEST: There are no chest wall deformities. There is no chest wall tenderness. Respirations are unlabored.  Lungs- decreased lung sounds b/l CARDIAC:  JVP: 10 w/ V waves          Normal S1, S2  Normal rate with regular rhythm. 3/6 SEM, rubs or gallops.  Pulses are 2+ and symmetrical in upper and lower extremities. 1-2+ edema.  ABDOMEN: Soft, non-tender, non-distended. There are no masses or hepatomegaly. There are normal bowel sounds.  EXTREMITIES: Warm and well perfused with no cyanosis, clubbing.  LYMPHATIC: No axillary or supraclavicular lymphadenopathy.  NEUROLOGIC: Patient is oriented x3 with no focal or lateralizing neurologic deficits.  PSYCH: Patients affect is appropriate, there is no evidence of anxiety or depression.  SKIN: Warm and dry; no lesions or wounds.   DATA REVIEW  ECG:NSR  ECHO: 09/24/21: LVEF 30-35%, RV function mildly reduced, severe posterior MR 8/23: LVEF 30-35%, LVID 7.2cm w/ grade III DD, moderate to severe MR. Severely dilated LA.  09/03/17: LVEF 60%-65%.   CATH: 06/19/21: 40 to 50% proximal right coronary narrowing. Right dominant anatomy Coronary arteries otherwise normal Significant elevation LVEDP 30 mmHg consistent with acute on chronic combined systolic and diastolic heart failure Mild pulmonary hypertension, mean pressure 30 mmHg.  WHO group II etiology based on hemodysnamics.. Capillary wedge mean 24 mmHg.  V wave to 40 mmHg suggesting some degree of mitral regurgitation. Cardiac output 8.5 L/min with index 3.57 L/min/m. Pulmonary resistance 0.7 Wood units.   ASSESSMENT & PLAN:  Heart failure with reduced EF Etiology of TO:[id number], LHC in 2023 with nonobstructive CAD. Will obtain CMR. Possibly valvular exacerbated by stress induced cardiomyopathy NYHA class / AHA Stage:NYHA III; significant limited by obesity, possibly underlying PH and MR.  Volume status & Diuretics: Hypervolemic, taking lasix '40mg'$  BID; asked to take an extra lasix today and tomorrow.  Vasodilators:losartan 12.'5mg'$   Beta-Blocker:Toprol '25mg'$  XL PJA:[id number] 12.'5mg'$   Cardiometabolic:start jardiance '10mg'$  daily Devices therapies & Valvulopathies:  ICD not indicated yet, will start to uptitrate GDMT. Severe MR on echo that needs further evaluation. Plan for RHC/TEE to evaluate MR  and treatment possibilities.  Advanced therapies:Currently not a candidate to to recency of LVEF reduction, morbid obesity and significant co-morbidities. Will plan on RHC/TEE to evaluate for CTEPH & severity of MR. I believe she will require mitral valve repair in the near future.   2. Severe MR - Severe eccentric posteriorly directed MR - Plan for RHC/TEE.   3. CAD - LHC in 5/23 w/ nonobstructive CAD - Followed by general cardiology  4. Hx of PE/DVT w/ IVC filter -Submassive saddle PE extending from the distal main pulmonary artery and to the left and right pulmonary arteries with extension into the left upper lobe, left lower lobe and lingula branches and right upper lobe, right middle lobe and right lower lobes on 04/27/2013  - Evaluated at [facility] where thrombophilia work-up was negative (Lupus inhibitor, anticardiolipin antibodies, and anti beta-2 glycoprotein antibodies were negative; protein C activity 85%, protein S activity 76%, factor V Leiden, and prothrombin gene mutation ) - IVC filter in place; could not be removed due to significant thrombus burden on filter at [facility].  - Previously advised against DOACs due to severity of thrombus burden and weight >250lbs.  - RHC to evaluate for CTEPH.   5. Morbid obesity  - BMI 43.5 today.   Follow-up:  No follow-ups on file.   Jonica Bickhart Advanced Heart Failure Mechanical Circulatory Support

## 2022-02-07 ENCOUNTER — Ambulatory Visit (HOSPITAL_COMMUNITY)
Admission: RE | Admit: 2022-02-07 | Discharge: 2022-02-07 | Disposition: A | Discharge status: Home / Self Care | Payer: Medicare HMO | Source: Ambulatory Visit | Attending: Hematology | Admitting: Hematology

## 2022-02-07 ENCOUNTER — Encounter (HOSPITAL_COMMUNITY): Payer: Self-pay

## 2022-02-07 ENCOUNTER — Encounter: Payer: Medicare HMO | Admitting: Obstetrics & Gynecology

## 2022-02-07 DIAGNOSIS — N63 Unspecified lump in unspecified breast: Secondary | ICD-10-CM | POA: Insufficient documentation

## 2022-02-07 DIAGNOSIS — N6452 Nipple discharge: Secondary | ICD-10-CM

## 2022-02-07 DIAGNOSIS — N611 Abscess of the breast and nipple: Secondary | ICD-10-CM | POA: Diagnosis not present

## 2022-02-11 ENCOUNTER — Telehealth: Payer: Self-pay | Admitting: *Deleted

## 2022-02-11 NOTE — Telephone Encounter (Signed)
   [place]

## 2022-02-13 ENCOUNTER — Other Ambulatory Visit: Payer: Self-pay

## 2022-02-13 ENCOUNTER — Inpatient Hospital Stay: Payer: Medicare HMO | Admitting: Hematology

## 2022-02-13 ENCOUNTER — Ambulatory Visit: Payer: Medicare HMO

## 2022-02-13 ENCOUNTER — Encounter (HOSPITAL_COMMUNITY): Payer: Self-pay | Admitting: *Deleted

## 2022-02-13 ENCOUNTER — Emergency Department (HOSPITAL_COMMUNITY): Payer: Medicare HMO

## 2022-02-13 ENCOUNTER — Inpatient Hospital Stay (HOSPITAL_COMMUNITY)
Admission: EM | Admit: 2022-02-13 | Discharge: 2022-02-16 | DRG: 291 | Disposition: A | Discharge status: Home / Self Care | Payer: Medicare HMO | Attending: Internal Medicine | Admitting: Internal Medicine

## 2022-02-13 DIAGNOSIS — I272 Pulmonary hypertension, unspecified: Secondary | ICD-10-CM | POA: Diagnosis not present

## 2022-02-13 DIAGNOSIS — I2699 Other pulmonary embolism without acute cor pulmonale: Secondary | ICD-10-CM | POA: Diagnosis present

## 2022-02-13 DIAGNOSIS — G4733 Obstructive sleep apnea (adult) (pediatric): Secondary | ICD-10-CM | POA: Diagnosis not present

## 2022-02-13 DIAGNOSIS — Z86711 Personal history of pulmonary embolism: Secondary | ICD-10-CM

## 2022-02-13 DIAGNOSIS — G894 Chronic pain syndrome: Secondary | ICD-10-CM | POA: Diagnosis present

## 2022-02-13 DIAGNOSIS — Z833 Family history of diabetes mellitus: Secondary | ICD-10-CM

## 2022-02-13 DIAGNOSIS — Z8249 Family history of ischemic heart disease and other diseases of the circulatory system: Secondary | ICD-10-CM | POA: Diagnosis not present

## 2022-02-13 DIAGNOSIS — R0989 Other specified symptoms and signs involving the circulatory and respiratory systems: Secondary | ICD-10-CM | POA: Diagnosis not present

## 2022-02-13 DIAGNOSIS — I428 Other cardiomyopathies: Secondary | ICD-10-CM | POA: Diagnosis present

## 2022-02-13 DIAGNOSIS — I5043 Acute on chronic combined systolic (congestive) and diastolic (congestive) heart failure: Secondary | ICD-10-CM | POA: Diagnosis not present

## 2022-02-13 DIAGNOSIS — I5023 Acute on chronic systolic (congestive) heart failure: Secondary | ICD-10-CM | POA: Diagnosis not present

## 2022-02-13 DIAGNOSIS — I110 Hypertensive heart disease with heart failure: Principal | ICD-10-CM | POA: Diagnosis present

## 2022-02-13 DIAGNOSIS — J811 Chronic pulmonary edema: Secondary | ICD-10-CM | POA: Diagnosis not present

## 2022-02-13 DIAGNOSIS — K2970 Gastritis, unspecified, without bleeding: Secondary | ICD-10-CM | POA: Diagnosis not present

## 2022-02-13 DIAGNOSIS — J208 Acute bronchitis due to other specified organisms: Secondary | ICD-10-CM | POA: Diagnosis not present

## 2022-02-13 DIAGNOSIS — F17210 Nicotine dependence, cigarettes, uncomplicated: Secondary | ICD-10-CM | POA: Diagnosis not present

## 2022-02-13 DIAGNOSIS — I2510 Atherosclerotic heart disease of native coronary artery without angina pectoris: Secondary | ICD-10-CM | POA: Diagnosis not present

## 2022-02-13 DIAGNOSIS — Z86718 Personal history of other venous thrombosis and embolism: Secondary | ICD-10-CM | POA: Diagnosis not present

## 2022-02-13 DIAGNOSIS — Z7901 Long term (current) use of anticoagulants: Secondary | ICD-10-CM | POA: Diagnosis not present

## 2022-02-13 DIAGNOSIS — R0602 Shortness of breath: Secondary | ICD-10-CM

## 2022-02-13 DIAGNOSIS — I340 Nonrheumatic mitral (valve) insufficiency: Secondary | ICD-10-CM | POA: Diagnosis present

## 2022-02-13 DIAGNOSIS — R059 Cough, unspecified: Secondary | ICD-10-CM | POA: Diagnosis not present

## 2022-02-13 DIAGNOSIS — Z95828 Presence of other vascular implants and grafts: Secondary | ICD-10-CM | POA: Diagnosis not present

## 2022-02-13 DIAGNOSIS — Z1152 Encounter for screening for COVID-19: Secondary | ICD-10-CM | POA: Diagnosis not present

## 2022-02-13 DIAGNOSIS — E66813 Obesity, class 3: Secondary | ICD-10-CM | POA: Diagnosis present

## 2022-02-13 DIAGNOSIS — I472 Ventricular tachycardia, unspecified: Secondary | ICD-10-CM | POA: Diagnosis not present

## 2022-02-13 DIAGNOSIS — Z882 Allergy status to sulfonamides status: Secondary | ICD-10-CM | POA: Diagnosis not present

## 2022-02-13 DIAGNOSIS — I10 Essential (primary) hypertension: Secondary | ICD-10-CM | POA: Diagnosis not present

## 2022-02-13 DIAGNOSIS — Z6841 Body Mass Index (BMI) 40.0 and over, adult: Secondary | ICD-10-CM

## 2022-02-13 DIAGNOSIS — R6889 Other general symptoms and signs: Secondary | ICD-10-CM

## 2022-02-13 DIAGNOSIS — Z79899 Other long term (current) drug therapy: Secondary | ICD-10-CM

## 2022-02-13 HISTORY — DX: Unspecified systolic (congestive) heart failure: I50.20

## 2022-02-13 LAB — CBC
HCT: 33.1 % — ABNORMAL LOW (ref 36.0–46.0)
Hemoglobin: 9.6 g/dL — ABNORMAL LOW (ref 12.0–15.0)
MCH: 23.2 pg — ABNORMAL LOW (ref 26.0–34.0)
MCHC: 29 g/dL — ABNORMAL LOW (ref 30.0–36.0)
MCV: 80.1 fL (ref 80.0–100.0)
Platelets: 195 10*3/uL (ref 150–400)
RBC: 4.13 MIL/uL (ref 3.87–5.11)
RDW: 24.1 % — ABNORMAL HIGH (ref 11.5–15.5)
WBC: 3.8 10*3/uL — ABNORMAL LOW (ref 4.0–10.5)
nRBC: 0 % (ref 0.0–0.2)

## 2022-02-13 LAB — COMPREHENSIVE METABOLIC PANEL
ALT: 15 U/L (ref 0–44)
AST: 23 U/L (ref 15–41)
Albumin: 3.3 g/dL — ABNORMAL LOW (ref 3.5–5.0)
Alkaline Phosphatase: 86 U/L (ref 38–126)
Anion gap: 10 (ref 5–15)
BUN: 15 mg/dL (ref 6–20)
CO2: 22 mmol/L (ref 22–32)
Calcium: 8.7 mg/dL — ABNORMAL LOW (ref 8.9–10.3)
Chloride: 100 mmol/L (ref 98–111)
Creatinine, Ser: 1.02 mg/dL — ABNORMAL HIGH (ref 0.44–1.00)
GFR, Estimated: >60 mL/min (ref >60)
Glucose, Bld: 96 mg/dL (ref 70–99)
Potassium: 4 mmol/L (ref 3.5–5.1)
Sodium: 132 mmol/L — ABNORMAL LOW (ref 135–145)
Total Bilirubin: 1.9 mg/dL — ABNORMAL HIGH (ref 0.3–1.2)
Total Protein: 8.2 g/dL — ABNORMAL HIGH (ref 6.5–8.1)

## 2022-02-13 LAB — APTT: aPTT: 32 seconds (ref 24–36)

## 2022-02-13 LAB — RESP PANEL BY RT-PCR (RSV, FLU A&B, COVID)  RVPGX2
Influenza A by PCR: NEGATIVE
Influenza B by PCR: NEGATIVE
Resp Syncytial Virus by PCR: NEGATIVE
SARS Coronavirus 2 by RT PCR: NEGATIVE

## 2022-02-13 LAB — LIPASE, BLOOD: Lipase: 27 U/L (ref 11–51)

## 2022-02-13 LAB — PROTIME-INR
INR: 2.1 — ABNORMAL HIGH (ref 0.8–1.2)
Prothrombin Time: 23.7 seconds — ABNORMAL HIGH (ref 11.4–15.2)

## 2022-02-13 LAB — PROCALCITONIN: Procalcitonin: <0.1 ng/mL

## 2022-02-13 LAB — BRAIN NATRIURETIC PEPTIDE: B Natriuretic Peptide: 1996 pg/mL — ABNORMAL HIGH (ref 0.0–100.0)

## 2022-02-13 MED ORDER — SPIRONOLACTONE 12.5 MG HALF TABLET
12.5000 mg | ORAL_TABLET | Freq: Every day | ORAL | Status: DC
  Administered 2022-02-13 – 2022-02-16 (×3): 12.5 mg via ORAL
  Filled 2022-02-13 (×5): qty 1

## 2022-02-13 MED ORDER — LOSARTAN POTASSIUM 25 MG PO TABS
12.5000 mg | ORAL_TABLET | Freq: Every day | ORAL | Status: DC
  Administered 2022-02-13 – 2022-02-16 (×4): 12.5 mg via ORAL
  Filled 2022-02-13 (×5): qty 1

## 2022-02-13 MED ORDER — LIVING BETTER WITH HEART FAILURE BOOK: Freq: Once | Status: AC

## 2022-02-13 MED ORDER — ATORVASTATIN CALCIUM 20 MG PO TABS
20.0000 mg | ORAL_TABLET | Freq: Every day | ORAL | Status: DC
  Administered 2022-02-13 – 2022-02-16 (×4): 20 mg via ORAL
  Filled 2022-02-13 (×5): qty 1

## 2022-02-13 MED ORDER — SODIUM CHLORIDE 0.9% FLUSH
3.0000 mL | Freq: Two times a day (BID) | INTRAVENOUS | Status: DC
  Administered 2022-02-13 – 2022-02-16 (×6): 3 mL via INTRAVENOUS

## 2022-02-13 MED ORDER — ONDANSETRON HCL 4 MG/2ML IJ SOLN: 4.0000 mg | Freq: Four times a day (QID) | INTRAMUSCULAR | Status: DC | PRN

## 2022-02-13 MED ORDER — BENZONATATE 100 MG PO CAPS
100.0000 mg | ORAL_CAPSULE | Freq: Once | ORAL | Status: AC
  Administered 2022-02-13: 100 mg via ORAL
  Filled 2022-02-13: qty 1

## 2022-02-13 MED ORDER — EMPAGLIFLOZIN 10 MG PO TABS
10.0000 mg | ORAL_TABLET | Freq: Every day | ORAL | Status: DC
  Administered 2022-02-13 – 2022-02-16 (×4): 10 mg via ORAL
  Filled 2022-02-13 (×5): qty 1

## 2022-02-13 MED ORDER — SODIUM CHLORIDE 0.9 % IV SOLN: 250.0000 mL | INTRAVENOUS | Status: DC | PRN

## 2022-02-13 MED ORDER — METOPROLOL SUCCINATE ER 25 MG PO TB24
25.0000 mg | ORAL_TABLET | Freq: Every day | ORAL | Status: DC
  Administered 2022-02-13 – 2022-02-16 (×4): 25 mg via ORAL
  Filled 2022-02-13 (×5): qty 1

## 2022-02-13 MED ORDER — WARFARIN - PHARMACIST DOSING INPATIENT: Freq: Every day | Status: DC

## 2022-02-13 MED ORDER — SODIUM CHLORIDE 0.9% FLUSH: 3.0000 mL | INTRAVENOUS | Status: DC | PRN

## 2022-02-13 MED ORDER — HYDROCODONE BIT-HOMATROP MBR 5-1.5 MG/5ML PO SOLN
5.0000 mL | ORAL | Status: DC | PRN
  Administered 2022-02-13 – 2022-02-16 (×15): 5 mL via ORAL
  Filled 2022-02-13 (×15): qty 5

## 2022-02-13 MED ORDER — FUROSEMIDE 10 MG/ML IJ SOLN
80.0000 mg | Freq: Once | INTRAMUSCULAR | Status: AC
  Administered 2022-02-13: 80 mg via INTRAVENOUS
  Filled 2022-02-13: qty 8

## 2022-02-13 MED ORDER — WARFARIN SODIUM 5 MG PO TABS
5.0000 mg | ORAL_TABLET | Freq: Once | ORAL | Status: AC
  Administered 2022-02-13: 5 mg via ORAL
  Filled 2022-02-13: qty 1

## 2022-02-13 MED ORDER — MAGNESIUM OXIDE -MG SUPPLEMENT 400 (240 MG) MG PO TABS
400.0000 mg | ORAL_TABLET | Freq: Every day | ORAL | Status: DC
  Administered 2022-02-13 – 2022-02-16 (×4): 400 mg via ORAL
  Filled 2022-02-13 (×5): qty 1

## 2022-02-13 MED ORDER — FUROSEMIDE 10 MG/ML IJ SOLN
80.0000 mg | Freq: Two times a day (BID) | INTRAMUSCULAR | Status: DC
  Administered 2022-02-13 – 2022-02-15 (×4): 80 mg via INTRAVENOUS
  Filled 2022-02-13 (×5): qty 8

## 2022-02-13 MED ORDER — MIDODRINE HCL 5 MG PO TABS
2.5000 mg | ORAL_TABLET | Freq: Two times a day (BID) | ORAL | Status: DC
  Administered 2022-02-13 – 2022-02-16 (×5): 2.5 mg via ORAL
  Filled 2022-02-13 (×7): qty 1

## 2022-02-13 MED ORDER — ACETAMINOPHEN 325 MG PO TABS
650.0000 mg | ORAL_TABLET | ORAL | Status: DC | PRN
  Administered 2022-02-13 – 2022-02-14 (×2): 650 mg via ORAL
  Filled 2022-02-13 (×2): qty 2

## 2022-02-13 NOTE — Progress Notes (Signed)
Pt setup on CPAP 10 with nasal mask tolerating well

## 2022-02-13 NOTE — ED Triage Notes (Addendum)
Pt c/o SOB, mid chest pain, chills, fever of up to 101.3 and diarrhea since last night. Pt became extremely SOB upon exertion, which she reports is not uncommon for her.

## 2022-02-13 NOTE — Progress Notes (Addendum)
ANTICOAGULATION CONSULT NOTE - Initial Consult  Pharmacy Consult for warfarin Indication: history of PE  Allergies  Allergen Reactions   Sulfa Antibiotics Itching, Swelling and Rash    Lip swelling    Patient Measurements: Height: '5\' 11"'$  (180.3 cm) Weight: (!) 141.1 kg (311 lb) IBW/kg (Calculated) : 70.8 Heparin Dosing Weight:   Vital Signs: Temp: 98.4 F (36.9 C) (01/24 0909) Temp Source: Oral (01/24 0909) BP: 117/92 (01/24 1130) Pulse Rate: 83 (01/24 1130)  Labs: Recent Labs    02/13/22 0915 02/13/22 0933  HGB 9.6*  --   HCT 33.1*  --   PLT 195  --   APTT  --  32  LABPROT  --  23.7*  INR  --  2.1*  CREATININE 1.02*  --     Estimated Creatinine Clearance: 108.7 mL/min (A) (by C-G formula based on SCr of 1.02 mg/dL (H)).   Medical History: Past Medical History:  Diagnosis Date   Abdominal cramps 08/30/2015   Anemia    Blood transfusion without reported diagnosis    CAD (coronary artery disease)    Chronic combined systolic and diastolic CHF (congestive heart failure) (HCC)    Closed left ankle fracture    August 30 2012   Clot[facility])    DVT (deep venous thrombosis) (HCC)    L leg   Fibroid tumor    Hidradenitis    High blood pressure    Hypertension    Mitral regurgitation    NICM (nonischemic cardiomyopathy) (HCC)    NSVT (nonsustained ventricular tachycardia) (HCC)    Obesity    OSA on CPAP    Oxygen deficiency    Pregnancy    09/20/14 17 weeks   Pulmonary embolism ([place]) 04/2013   Sleep apnea    Tobacco use disorder     Medications:  (Not in a hospital admission)   Assessment: Pharmacy consulted to dose warfarin in patient with history of PE.  Home does listed as 10 mg every Mon and 5 mg ROW. Last dose listed as 1/23. INR therapeutic at 2.1.  Goal of Therapy:  INR 2-3 Monitor platelets by anticoagulation protocol: Yes   Plan:  Warfarin 5 mg x 1 dose. Monitor daily INR and s/s of bleeding.  Margot Ables, PharmD Clinical  Pharmacist 02/13/2022 12:48 PM

## 2022-02-13 NOTE — ED Provider Notes (Signed)
[facility] Provider Note   CSN: [id number] Arrival date & time: 02/13/22  9675     History  Chief Complaint  Patient presents with   Shortness of Breath    [name] [name] is a 47 y.o. female with history of prior PE on Warfarin s/p IVC filter, iron deficiency anemia, chronic pain syndrome, HTN, CHF (EF 30-35%), OSA on CPAP, CAD who presents to the emergency department complaining of shortness of breath and flu like symptoms since last night. Reports onset of mid-chest pain, fever with max temp 101.3 F and diarrhea. Reporting feeling very short of breath with exertion, but says some component of this is normal for her.  Patient states that she has been trying to use her albuterol inhaler with some relief.  She does not feel like she is holding onto fluid, and reports taking her Lasix as prescribed.  She notes in the past that they were having her double up on this because she was retaining fluid.  She was also previously seen in the hospital for right breast swelling, and states that she still having some pain associated with this.   Shortness of Breath Associated symptoms: chest pain, cough, fever and vomiting        Home Medications Prior to Admission medications   Medication Sig Start Date End Date Taking? Authorizing Provider  albuterol (VENTOLIN HFA) 108 (90 Base) MCG/ACT inhaler Inhale 2 puffs into the lungs every 2 (two) hours as needed for wheezing or shortness of breath. 09/13/21   Rigoberto Noel, MD  atorvastatin (LIPITOR) 20 MG tablet Take 1 tablet (20 mg total) by mouth daily. 09/10/21   Arnoldo Lenis, MD  empagliflozin (JARDIANCE) 10 MG TABS tablet Take 1 tablet (10 mg total) by mouth daily before breakfast. 01/30/22   [name], [name], DO  furosemide (LASIX) 40 MG tablet Take 40 mg by mouth 2 (two) times daily.    [provider]  losartan (COZAAR) 25 MG tablet Take 0.5 tablets (12.5 mg total) by mouth daily.  01/11/22 01/06/23  Kathie Dike, MD  Magnesium Oxide 400 MG CAPS Take 1 capsule (400 mg total) by mouth daily. 11/14/21   Dunn, Nedra Hai, PA-C  metoprolol succinate (TOPROL-XL) 25 MG 24 hr tablet Take 1 tablet (25 mg total) by mouth daily. Take with or immediately following a meal. 01/11/22 01/06/23  Kathie Dike, MD  midodrine (PROAMATINE) 2.5 MG tablet Take 1 tablet (2.5 mg total) by mouth 2 (two) times daily with a meal. 01/11/22   Kathie Dike, MD  oxyCODONE (OXY IR/ROXICODONE) 5 MG immediate release tablet Take 1 tablet (5 mg total) by mouth every 6 (six) hours as needed for moderate pain. 01/11/22   Kathie Dike, MD  potassium chloride 20 MEQ TBCR Take 20 mEq by mouth 2 (two) times daily. 01/11/22 01/06/23  Kathie Dike, MD  spironolactone (ALDACTONE) 25 MG tablet Take 0.5 tablets (12.5 mg total) by mouth daily. 06/22/21   Regalado, Belkys A, MD  warfarin (COUMADIN) 10 MG tablet Take 5-10 mg by mouth See admin instructions. Take as directed in the evening : Monday take 10 mg and all other days take 5 mg.    [provider]  warfarin (COUMADIN) 5 MG tablet TAKE (1) OR (1) 1/2 TABLET BY MOUTH DAILY AS DIRECTED. 09/25/21   Nita Sells, MD      Allergies    Sulfa antibiotics    Review of Systems   Review of Systems  Constitutional:  Positive for fever.  Respiratory:  Positive for cough and shortness of breath.   Cardiovascular:  Positive for chest pain. Negative for leg swelling.  Gastrointestinal:  Positive for diarrhea, nausea and vomiting.  All other systems reviewed and are negative.   Physical Exam Updated Vital Signs BP (!) 117/92   Pulse 83   Temp 98.4 F (36.9 C) (Oral)   Resp (!) 28   Ht '5\' 11"'$  (1.803 m)   Wt (!) 141.1 kg   LMP 12/27/2021   SpO2 100%   BMI 43.38 kg/m  Physical Exam Vitals and nursing note reviewed.  Constitutional:      Appearance: Normal appearance. She is obese.  HENT:     Head: Normocephalic and atraumatic.  Eyes:      Conjunctiva/sclera: Conjunctivae normal.  Cardiovascular:     Rate and Rhythm: Normal rate and regular rhythm.  Pulmonary:     Effort: Tachypnea present. No respiratory distress.     Breath sounds: Normal breath sounds.     Comments: Increased respiratory effort, but clear lung sounds.  Nonproductive cough.  Placed on 2 L nasal cannula for comfort, continue normal oxygen saturations on room air. Abdominal:     General: There is no distension.     Palpations: Abdomen is soft.     Tenderness: There is no abdominal tenderness.  Musculoskeletal:     Right lower leg: No edema.     Left lower leg: No edema.  Skin:    General: Skin is warm and dry.  Neurological:     General: No focal deficit present.     Mental Status: She is alert.     ED Results / Procedures / Treatments   Labs (all labs ordered are listed, but only abnormal results are displayed) Labs Reviewed  COMPREHENSIVE METABOLIC PANEL - Abnormal; Notable for the following components:      Result Value   Sodium 132 (*)    Creatinine, Ser 1.02 (*)    Calcium 8.7 (*)    Total Protein 8.2 (*)    Albumin 3.3 (*)    Total Bilirubin 1.9 (*)    All other components within normal limits  CBC - Abnormal; Notable for the following components:   WBC 3.8 (*)    Hemoglobin 9.6 (*)    HCT 33.1 (*)    MCH 23.2 (*)    MCHC 29.0 (*)    RDW 24.1 (*)    All other components within normal limits  BRAIN NATRIURETIC PEPTIDE - Abnormal; Notable for the following components:   B Natriuretic Peptide 1,996.0 (*)    All other components within normal limits  PROTIME-INR - Abnormal; Notable for the following components:   Prothrombin Time 23.7 (*)    INR 2.1 (*)    All other components within normal limits  RESP PANEL BY RT-PCR (RSV, FLU A&B, COVID)  RVPGX2  LIPASE, BLOOD  APTT  URINALYSIS, ROUTINE W REFLEX MICROSCOPIC  POC URINE PREG, ED    EKG EKG Interpretation  Date/Time:  Wednesday February 13 2022 09:08:30 EST Ventricular  Rate:  86 PR Interval:  143 QRS Duration: 107 QT Interval:  382 QTC Calculation: 457 R Axis:   -61 Text Interpretation: Sinus rhythm LAD, consider left anterior fascicular block RVH with secondary repolarization abnrm Abnormal lateral Q waves T wave abnormality Abnormal ECG Confirmed by Carmin Muskrat [phone]) on 02/13/2022 9:42:43 AM  Radiology DG Chest 2 View  Result Date: 02/13/2022 CLINICAL DATA:  Cough with shortness of breath EXAM:  CHEST - 2 VIEW COMPARISON:  12/16/2021 FINDINGS: Mild bilateral interstitial thickening. No focal consolidation. No pleural effusion or pneumothorax. Prominence of the central pulmonary vasculature. Stable cardiomegaly. No acute osseous abnormality. IMPRESSION: 1. Cardiomegaly with mild pulmonary vascular congestion. Electronically Signed   By: Kathreen Devoid M.D.   On: 02/13/2022 10:19    Procedures Procedures    Medications Ordered in ED Medications  furosemide (LASIX) injection 80 mg (80 mg Intravenous Given 02/13/22 1127)    ED Course/ Medical Decision Making/ A&P                             Medical Decision Making Amount and/or Complexity of Data Reviewed Labs: ordered. Radiology: ordered.  Risk Prescription drug management. Decision regarding hospitalization.   This patient is a 47 y.o. female  who presents to the ED for concern of shortness of breath and flu like symptoms since last night. .   Differential diagnoses prior to evaluation: The emergent differential diagnosis includes, but is not limited to, CHF, pericardial effusion/tamponade, arrhythmias, ACS, COPD, asthma, bronchitis, pneumonia, pneumothorax, PE, anemia. This is not an exhaustive differential.   Past Medical History / Co-morbidities: prior PE on Warfarin s/p IVC filter, iron deficiency anemia, chronic pain syndrome, HTN, CHF (EF 30-35%), OSA on CPAP, CAD  Additional history: Chart reviewed. Pertinent results include: Most recent echocardiogram from September 2023 with  LVEF 30-35%, Holter monitor results from October 2023 with nonsustained runs of ventricular tachycardia  Physical Exam: Physical exam performed. The pertinent findings include: Patient with increased respiratory effort and tachypnea.  Nonproductive cough.  Placed on 2 L nasal cannula for comfort.  No appreciable leg swelling.  Abdomen nondistended.  Lab Tests/Imaging studies: I personally interpreted labs/imaging and the pertinent results include: Hemoglobin stable at 9.6.  Sodium 132, otherwise electrolytes within normal limits.  Mildly elevated creatinine, stable compared to prior.  Otherwise unremarkable CMP.  Normal lipase.  BNP 1996.  INR elevated to 2.1, PT 23.7.  Respiratory panel negative for COVID and flu.  X-ray with cardiomegaly and pulmonary vascular congestion, no effusion. I agree with the radiologist interpretation.  Cardiac monitoring: EKG obtained and interpreted by my attending physician which shows: sinus rhythm, abnormal nonspecific T waves, grossly unchanged compared to prior   Medications: I ordered medication including lasix.  I have reviewed the patients home medicines and have made adjustments as needed.  Consultations obtained: I requested consultation with the cardiologist but did not receive call prior to hospitalist admission.   I consulted with hospitalist Dr Tat who will admit.    Disposition: After consideration of the diagnostic results and the patients response to treatment, I feel that patient would benefit from hospitalization for ongoing diuresis and evaluation of IVC filter, as patient has history of significant clot burden, although INR is at therapeutic range.   I discussed this case with my attending physician Dr. Vanita Panda who cosigned this note including patient's presenting symptoms, physical exam, and planned diagnostics and interventions. Attending physician stated agreement with plan or made changes to plan which were implemented.   Final  Clinical Impression(s) / ED Diagnoses Final diagnoses:  Acute on chronic combined systolic and diastolic heart failure (HCC)  Shortness of breath  Flu-like symptoms    Rx / DC Orders ED Discharge Orders     None      Portions of this report may have been transcribed using voice recognition software. Every effort was made to ensure accuracy;  however, inadvertent computerized transcription errors may be present.    Estill Cotta 02/13/22 1238    Carmin Muskrat, MD 02/13/22 1536

## 2022-02-13 NOTE — Hospital Course (Addendum)
47 y.o. female, known history of DVT PE 2015 IVC filter prior on Xarelto --- recurrence DVT/PE January 2023 status post thrombectomy on lifelong anticoagulation with Coumadin secondary to weight >250 pounds, OSA on CPAP, Known uterine fibroids, Smoker With recent admission to [facility] for decompensated HF 12/8-12/22/23 presenting with shortness of breath that has worsened since last Thursday, 02/07/2022.  She states that on the evening of 02/13/22, she developed worsening cough, shortness of breath, fever up to 101.8 F.  She had an episode of nausea and vomiting.  She also had 3 loose bowel movements.  There was no hemoptysis.  There is no hematochezia or melena.  There Is no hematemesis.  The patient does endorse a degree of indiscretion with fluid intake.  However, she states that she has been compliant with her furosemide 40 mg twice daily at home.  She has had some intermittent right-sided chest pain in the past week.  She denies any headache, neck pain, dysuria, hematuria.  At the time of discharge, she was 156 kg.  She states that her most recent weight was 141 kg at home. In the ED, the patient was afebrile and hemodynamically stable with oxygen saturation 99% on room air.  WBC 3.8, hemoglobin 9.6, platelets 195,000.  Sodium 132, potassium 4.0, bicarbonate 22, serum creatinine 1.02.  INR was 2.1.  BNP 1996.  Chest x-ray showed pulmonary vascular congestion.  The patient was given furosemide 80 mg IV x 1 and admitted for further evaluation and treatment.  She was continued on IV lasix with clinical improvement.  Cardiology was consulted.  Repeat echo was obtain and did not show any significant changes from 09/2021 Echo, although EF was slightly better at 35-40%.  She was discharged home with her prior lasix and GDMT regimen to follow up at the advanced HF clinic.

## 2022-02-13 NOTE — H&P (Addendum)
History and Physical    Patient: [name] [name] CHY:[id number] DOB: Nov 02, 1975 DOA: 02/13/2022 DOS: the patient was seen and examined on 02/13/2022 PCP: Carrolyn Meiers, MD  Patient coming from: Home  Chief Complaint:  Chief Complaint  Patient presents with   Shortness of Breath   HPI: [name] [name] is a 47 y.o. female, known history of DVT PE 2015 IVC filter prior on Xarelto --- recurrence DVT/PE January 2023 status post thrombectomy on lifelong anticoagulation with Coumadin secondary to weight >250 pounds, OSA on CPAP, Known uterine fibroids, Smoker With recent admission to [facility] for decompensated HF 12/8-12/22/23 presenting with shortness of breath that has worsened since last Thursday, 02/07/2022.  She states that on the evening of 02/13/22, she developed worsening cough, shortness of breath, fever up to 101.8 F.  She had an episode of nausea and vomiting.  She also had 3 loose bowel movements.  There was no hemoptysis.  There is no hematochezia or melena.  There Is no hematemesis.  The patient does endorse a degree of indiscretion with fluid intake.  However, she states that she has been compliant with her furosemide 40 mg twice daily at home.  She has had some intermittent right-sided chest pain in the past week.  She denies any headache, neck pain, dysuria, hematuria.  At the time of discharge, she was 156 kg.  She states that her most recent weight was 141 kg at home. In the ED, the patient was afebrile and hemodynamically stable with oxygen saturation 99% on room air.  WBC 3.8, hemoglobin 9.6, platelets 195,000.  Sodium 132, potassium 4.0, bicarbonate 22, serum creatinine 1.02.  INR was 2.1.  BNP 1996.  Chest x-ray showed pulmonary vascular congestion.  The patient was given furosemide 80 mg IV x 1 and admitted for further evaluation and treatment.  Review of Systems: As mentioned in the history of present illness. All other systems reviewed and are negative. Past Medical History:   Diagnosis Date   Abdominal cramps 08/30/2015   Anemia    Blood transfusion without reported diagnosis    CAD (coronary artery disease)    Chronic combined systolic and diastolic CHF (congestive heart failure) (HCC)    Closed left ankle fracture    August 30 2012   Clotting disorder [facility])    DVT (deep venous thrombosis) (HCC)    L leg   Fibroid tumor    Hidradenitis    High blood pressure    Hypertension    Mitral regurgitation    NICM (nonischemic cardiomyopathy) (HCC)    NSVT (nonsustained ventricular tachycardia) (HCC)    Obesity    OSA on CPAP    Oxygen deficiency    Pregnancy    09/20/14 17 weeks   Pulmonary embolism ([place]) 04/2013   Sleep apnea    Tobacco use disorder    Past Surgical History:  Procedure Laterality Date   ABSCESS DRAINAGE Bilateral 01/10/2015   BREAST BIOPSY Right 02/01/2020   benign   COLONOSCOPY WITH PROPOFOL N/A 06/08/2018   Procedure: COLONOSCOPY WITH PROPOFOL;  Surgeon: Daneil Dolin, MD;  Location: [facility];  Service: Endoscopy;  Laterality: N/A;  2:30pm   CYST REMOVAL TRUNK     IVC FILTER PLACEMENT ([place] HX)     LOWER EXTREMITY VENOGRAPHY N/A 02/07/2021   Procedure: LOWER EXTREMITY VENOGRAPHY;  Surgeon: [name], MD;  Location: [place] CV LAB;  Service: Cardiovascular;  Laterality: N/A;   PERIPHERAL VASCULAR THROMBECTOMY N/A 02/07/2021   Procedure: PERIPHERAL  VASCULAR THROMBECTOMY;  Surgeon: [name], MD;  Location: [place] CV LAB;  Service: Cardiovascular;  Laterality: N/A;   RIGHT/LEFT HEART CATH AND CORONARY ANGIOGRAPHY N/A 06/19/2021   Procedure: RIGHT/LEFT HEART CATH AND CORONARY ANGIOGRAPHY;  Surgeon: Belva Crome, MD;  Location: [facility] CV LAB;  Service: Cardiovascular;  Laterality: N/A;   Social History:  reports that she has been smoking cigarettes. She has never used smokeless tobacco. She reports that she does not currently use alcohol after a past usage of about 2.0 standard drinks of alcohol per  week. She reports that she does not use drugs.  Allergies  Allergen Reactions   Sulfa Antibiotics Itching, Swelling and Rash    Lip swelling    Family History  Problem Relation Age of Onset   Hypertension Mother    Diabetes Paternal Aunt    Diabetes Paternal Uncle    Other Father        blood clots   Cancer Maternal Grandmother    Heart attack Paternal Grandmother    Colon cancer Neg Hx     Prior to Admission medications   Medication Sig Start Date End Date Taking? Authorizing Provider  albuterol (VENTOLIN HFA) 108 (90 Base) MCG/ACT inhaler Inhale 2 puffs into the lungs every 2 (two) hours as needed for wheezing or shortness of breath. 09/13/21  Yes Rigoberto Noel, MD  atorvastatin (LIPITOR) 20 MG tablet Take 1 tablet (20 mg total) by mouth daily. 09/10/21  Yes Branch, Alphonse Guild, MD  empagliflozin (JARDIANCE) 10 MG TABS tablet Take 1 tablet (10 mg total) by mouth daily before breakfast. 01/30/22  Yes [name], [name], DO  furosemide (LASIX) 40 MG tablet Take 40 mg by mouth 2 (two) times daily.   Yes [provider]  losartan (COZAAR) 25 MG tablet Take 0.5 tablets (12.5 mg total) by mouth daily. 01/11/22 01/06/23 Yes Kathie Dike, MD  Magnesium Oxide 400 MG CAPS Take 1 capsule (400 mg total) by mouth daily. 11/14/21  Yes Dunn, Dayna N, PA-C  metoprolol succinate (TOPROL-XL) 25 MG 24 hr tablet Take 1 tablet (25 mg total) by mouth daily. Take with or immediately following a meal. 01/11/22 01/06/23 Yes Memon, Jolaine Artist, MD  midodrine (PROAMATINE) 2.5 MG tablet Take 1 tablet (2.5 mg total) by mouth 2 (two) times daily with a meal. 01/11/22  Yes Kathie Dike, MD  oxyCODONE (OXY IR/ROXICODONE) 5 MG immediate release tablet Take 1 tablet (5 mg total) by mouth every 6 (six) hours as needed for moderate pain. 01/11/22  Yes Kathie Dike, MD  potassium chloride 20 MEQ TBCR Take 20 mEq by mouth 2 (two) times daily. 01/11/22 01/06/23 Yes Kathie Dike, MD  spironolactone  (ALDACTONE) 25 MG tablet Take 0.5 tablets (12.5 mg total) by mouth daily. 06/22/21  Yes Regalado, Belkys A, MD  warfarin (COUMADIN) 10 MG tablet Take 5-10 mg by mouth See admin instructions. Take as directed in the evening : Monday take 10 mg and all other days take 5 mg.   Yes [provider]  warfarin (COUMADIN) 5 MG tablet TAKE (1) OR (1) 1/2 TABLET BY MOUTH DAILY AS DIRECTED. Patient not taking: Reported on 02/13/2022 09/25/21   Nita Sells, MD    Physical Exam: Vitals:   02/13/22 0930 02/13/22 1000 02/13/22 1100 02/13/22 1130  BP: (!) 117/105 (!) 109/92 110/89 (!) 117/92  Pulse: 92 83 80 83  Resp: (!) 27 (!) 22 (!) 21 (!) 28  Temp:      TempSrc:  SpO2: 99% 100% 100% 100%  Weight:      Height:       GENERAL:  A&O x 3, NAD, well developed, cooperative, follows commands HEENT: [place]/AT, No thrush, No icterus, No oral ulcers Neck:  No neck mass, No meningismus, soft, supple CV: RRR, no S3, no S4, no rub, no JVD Lungs: Bilateral crackles.  Mild bibasilar wheeze. Abd: soft/NT +BS, nondistended Ext: 1+LE  edema, no lymphangitis, no cyanosis, no rashes Neuro:  CN II-XII intact, strength 4/5 in RUE, RLE, strength 4/5 LUE, LLE; sensation intact bilateral; no dysmetria; babinski equivocal  Data Reviewed: Data reviewed above in the history  Assessment and Plan: Acute on chronic Heart failure with reduced EF  -Nonischemic, LHC in 2023 with nonobstructive CAD.  -09/24/2021 echo EF 30-35%, mildly reduced RVF moderate to severe MR -Continue IV furosemide -Daily weights -Accurate I's and O's -Continue spironolactone, metoprolol succinate, losartan, Jardiance  Acute bronchitis -COVID and flu negative -Check viral panel -Start Hycodan for cough -Check PCT -Chest x-ray without consolidation   DVT/PE (2015/2023) Chronic anticoagulation. -Submassive saddle PE on 04/27/2013  - Evaluated at [facility] where thrombophilia work-up was negative (Lupus inhibitor, anticardiolipin  antibodies, and anti beta-2 glycoprotein antibodies were negative; protein C activity 85%, protein S activity 76%, factor V Leiden, and prothrombin gene mutation ) - IVC filter in place; could not be removed due to significant thrombus burden on filter at [facility].  - Previously advised against DOACs due to severity of thrombus burden and weight >250lbs.  obesity/OSA/pulmonary hypertension - continue CPAP nightly    Morbid Obesity -BMI 48 -lifestyle modification  Fever -Obtain blood cultures -UA  -Check PCT  Morbid Obesity -BMI 43.48 -lifestyle modification   Advance Care Planning: FULL CODE  Consults: cardiology  Family Communication: none present  Severity of Illness: The appropriate patient status for this patient is INPATIENT. Inpatient status is judged to be reasonable and necessary in order to provide the required intensity of service to ensure the patient's safety. The patient's presenting symptoms, physical exam findings, and initial radiographic and laboratory data in the context of their chronic comorbidities is felt to place them at high risk for further clinical deterioration. Furthermore, it is not anticipated that the patient will be medically stable for discharge from the hospital within 2 midnights of admission.   * I certify that at the point of admission it is my clinical judgment that the patient will require inpatient hospital care spanning beyond 2 midnights from the point of admission due to high intensity of service, high risk for further deterioration and high frequency of surveillance required.*  Author: Orson Eva, MD 02/13/2022 12:28 PM  For on call review [email or web address].

## 2022-02-13 NOTE — TOC Initial Note (Signed)
Transition of Care [facility]) - Initial/Assessment Note    Patient Details  Name: [name] [name] MRN: [id number] Date of Birth: 12-23-1975  Transition of Care [facility]) CM/SW Contact:    Iona Beard, [place] Phone Number: 02/13/2022, 12:42 PM  Clinical Narrative:                 Pt is high risk for readmission. CSW noted per chart review that assessment was complete 12/11. CSW spoke with pt to complete assessment. Pt states she lives with her mother. Pt is independent in completing her ADLs and provides her own transportation when needed. Pt states she has not had HH in the past. Pt does not use any DME in the home. TOC to follow.   Expected Discharge Plan: Home/Self Care Barriers to Discharge: Continued Medical Work up   Patient Goals and CMS Choice Patient states their goals for this hospitalization and ongoing recovery are:: return home CMS Medicare.gov Compare Post Acute Care list provided to:: Patient Choice offered to / list presented to : Patient      Expected Discharge Plan and Services In-house Referral: Clinical Social Work Discharge Planning Services: CM Consult   Living arrangements for the past 2 months: Single Family Home                                      Prior Living Arrangements/Services Living arrangements for the past 2 months: Single Family Home Lives with:: Parents Patient language and need for interpreter reviewed:: Yes Do you feel safe going back to the place where you live?: Yes      Need for Family Participation in Patient Care: Yes (Comment) Care giver support system in place?: Yes (comment)   Criminal Activity/Legal Involvement Pertinent to Current Situation/Hospitalization: No - Comment as needed  Activities of Daily Living      Permission Sought/Granted                  Emotional Assessment Appearance:: Appears stated age Attitude/Demeanor/Rapport: Engaged Affect (typically observed): Accepting Orientation: : Oriented to  Self, Oriented to Place, Oriented to  Time, Oriented to Situation Alcohol / Substance Use: Not Applicable Psych Involvement: No (comment)  Admission diagnosis:  Acute on chronic HFrEF (heart failure with reduced ejection fraction) (HCC) [I50.23] Patient Active Problem List   Diagnosis Date Noted   Acute on chronic HFrEF (heart failure with reduced ejection fraction) (HCC) 02/13/2022   Breast swelling 01/24/2022   Cellulitis of breast 12/28/2021   Closed cervical spine fracture/C2 09/22/2021   Syncope and collapse 09/22/2021   Obesity (BMI 30-39.9) 09/22/2021   Pulmonary edema 06/14/2021   Dysphagia 06/14/2021   Acute on chronic combined systolic and diastolic CHF (congestive heart failure) ([place])    CAP (community acquired pneumonia) 06/05/2021   Acute on chronic diastolic CHF (congestive heart failure) ([place]) 06/05/2021   Other pancytopenia ([place]) 06/05/2021   Essential hypertension 06/05/2021   Hypokalemia 06/05/2021   Microcytic anemia    Lumbar spondylosis 12/14/2018   Lumbar foraminal stenosis 12/14/2018   Lumbar degenerative disc disease 12/14/2018   Chronic anticoagulation 03/24/2018   STD exposure 03/11/2018   History of miscarriage 12/01/2017   HTN (hypertension) 12/01/2017   Left leg DVT ([place]) 12/01/2017   Menorrhagia 12/01/2017   S/P IVC filter 12/01/2017   Chronic bilateral low back pain with bilateral sciatica 12/01/2017   Chronic pain of left ankle 12/01/2017   Chronic pain  syndrome 12/01/2017   Long term current use of opiate analgesic 12/01/2017   Encounter for therapeutic drug monitoring 09/03/2017   History of pulmonary embolism 08/27/2017   Abdominal cramps 08/30/2015   Morbid obesity ([place]) 08/18/2014   Uterine leiomyoma 08/18/2014   Benign essential hypertension 07/21/2014   Severe Iron deficiency anemia 02/05/2014   Back pain with left-sided radiculopathy 10/31/2013   Secondary pulmonary hypertension 07/06/2013   OSA on CPAP 07/06/2013   Pleuritic chest  pain 04/27/2013   SOB (shortness of breath) 04/27/2013   Anemia 04/27/2013   Tobacco use disorder 04/27/2013   Saddle embolus of pulmonary artery with acute cor pulmonale ([place]) 04/27/2013   Bilateral pulmonary embolism ([place]) 04/27/2013   PCP:  Carrolyn Meiers, MD Pharmacy:   [place], [address] [place] [address] Phone: [phone] Fax: [phone]     Social Determinants of Health (SDOH) Social History: [place]: No Food Insecurity (01/24/2022)  Housing: Low Risk  (01/24/2022)  Transportation Needs: No Transportation Needs (01/24/2022)  Utilities: Not At Risk (01/24/2022)  Depression (PHQ2-9): Low Risk  (01/24/2022)  Tobacco Use: High Risk (02/13/2022)   SDOH Interventions:     Readmission Risk Interventions    02/13/2022   12:41 PM 12/31/2021    1:52 PM  Readmission Risk Prevention Plan  Transportation Screening Complete   HRI or Home Care Consult Complete Complete  Social Work Consult for [place] Planning/Counseling Complete Complete  Palliative Care Screening Not Applicable Not Applicable  Medication Review Press photographer) Complete Complete

## 2022-02-14 ENCOUNTER — Encounter (HOSPITAL_COMMUNITY): Payer: Self-pay | Admitting: Internal Medicine

## 2022-02-14 DIAGNOSIS — I10 Essential (primary) hypertension: Secondary | ICD-10-CM

## 2022-02-14 DIAGNOSIS — I2699 Other pulmonary embolism without acute cor pulmonale: Secondary | ICD-10-CM | POA: Diagnosis not present

## 2022-02-14 DIAGNOSIS — I5023 Acute on chronic systolic (congestive) heart failure: Secondary | ICD-10-CM | POA: Diagnosis not present

## 2022-02-14 LAB — BASIC METABOLIC PANEL
Anion gap: 9 (ref 5–15)
BUN: 15 mg/dL (ref 6–20)
CO2: 27 mmol/L (ref 22–32)
Calcium: 8.6 mg/dL — ABNORMAL LOW (ref 8.9–10.3)
Chloride: 99 mmol/L (ref 98–111)
Creatinine, Ser: 1.09 mg/dL — ABNORMAL HIGH (ref 0.44–1.00)
GFR, Estimated: >60 mL/min (ref >60)
Glucose, Bld: 89 mg/dL (ref 70–99)
Potassium: 3.9 mmol/L (ref 3.5–5.1)
Sodium: 135 mmol/L (ref 135–145)

## 2022-02-14 LAB — MAGNESIUM: Magnesium: 2.2 mg/dL (ref 1.7–2.4)

## 2022-02-14 LAB — PROTIME-INR
INR: 1.9 — ABNORMAL HIGH (ref 0.8–1.2)
Prothrombin Time: 21.9 seconds — ABNORMAL HIGH (ref 11.4–15.2)

## 2022-02-14 MED ORDER — HYDROCODONE-ACETAMINOPHEN 5-325 MG PO TABS
1.0000 | ORAL_TABLET | Freq: Four times a day (QID) | ORAL | Status: DC | PRN
  Administered 2022-02-14 – 2022-02-16 (×6): 1 via ORAL
  Filled 2022-02-14 (×6): qty 1

## 2022-02-14 MED ORDER — WARFARIN SODIUM 7.5 MG PO TABS
7.5000 mg | ORAL_TABLET | Freq: Once | ORAL | Status: AC
  Administered 2022-02-14: 7.5 mg via ORAL
  Filled 2022-02-14: qty 1

## 2022-02-14 NOTE — Progress Notes (Signed)
ANTICOAGULATION CONSULT NOTE -  Pharmacy Consult for warfarin Indication: history of PE  Allergies  Allergen Reactions   Sulfa Antibiotics Itching, Swelling and Rash    Lip swelling    Patient Measurements: Height: '5\' 11"'$  (180.3 cm) Weight: 127.9 kg (282 lb) IBW/kg (Calculated) : 70.8 Heparin Dosing Weight:   Vital Signs: Temp: 98.2 F (36.8 C) (01/25 0359) Temp Source: Oral (01/25 0359) BP: 110/75 (01/25 0359) Pulse Rate: 68 (01/25 0359)  Labs: Recent Labs    02/13/22 0915 02/13/22 0933 02/14/22 0422  HGB 9.6*  --   --   HCT 33.1*  --   --   PLT 195  --   --   APTT  --  32  --   LABPROT  --  23.7* 21.9*  INR  --  2.1* 1.9*  CREATININE 1.02*  --  1.09*     Estimated Creatinine Clearance: 96.3 mL/min (A) (by C-G formula based on SCr of 1.09 mg/dL (H)).   Medical History: Past Medical History:  Diagnosis Date   Abdominal cramps 08/30/2015   Anemia    Blood transfusion without reported diagnosis    CAD (coronary artery disease)    Chronic combined systolic and diastolic CHF (congestive heart failure) (HCC)    Closed left ankle fracture    August 30 2012   Clotting disorder [facility])    DVT (deep venous thrombosis) (HCC)    L leg   Fibroid tumor    Hidradenitis    High blood pressure    Hypertension    Mitral regurgitation    NICM (nonischemic cardiomyopathy) (HCC)    NSVT (nonsustained ventricular tachycardia) (HCC)    Obesity    OSA on CPAP    Oxygen deficiency    Pregnancy    09/20/14 17 weeks   Pulmonary embolism ([place]) 04/2013   Sleep apnea    Tobacco use disorder     Medications:  Medications Prior to Admission  Medication Sig Dispense Refill Last Dose   albuterol (VENTOLIN HFA) 108 (90 Base) MCG/ACT inhaler Inhale 2 puffs into the lungs every 2 (two) hours as needed for wheezing or shortness of breath. 18 g 11 02/12/2022   atorvastatin (LIPITOR) 20 MG tablet Take 1 tablet (20 mg total) by mouth daily. 90 tablet 3 02/12/2022   empagliflozin  (JARDIANCE) 10 MG TABS tablet Take 1 tablet (10 mg total) by mouth daily before breakfast. 90 tablet 3 02/12/2022   furosemide (LASIX) 40 MG tablet Take 40 mg by mouth 2 (two) times daily.   02/12/2022   losartan (COZAAR) 25 MG tablet Take 0.5 tablets (12.5 mg total) by mouth daily. 90 tablet 3 02/12/2022   Magnesium Oxide 400 MG CAPS Take 1 capsule (400 mg total) by mouth daily. 90 capsule 3 02/12/2022   metoprolol succinate (TOPROL-XL) 25 MG 24 hr tablet Take 1 tablet (25 mg total) by mouth daily. Take with or immediately following a meal. 90 tablet 3 02/12/2022 at 0800   midodrine (PROAMATINE) 2.5 MG tablet Take 1 tablet (2.5 mg total) by mouth 2 (two) times daily with a meal. 60 tablet 0 02/12/2022   oxyCODONE (OXY IR/ROXICODONE) 5 MG immediate release tablet Take 1 tablet (5 mg total) by mouth every 6 (six) hours as needed for moderate pain. 20 tablet 0 unk   potassium chloride 20 MEQ TBCR Take 20 mEq by mouth 2 (two) times daily. 90 tablet 3 02/12/2022   spironolactone (ALDACTONE) 25 MG tablet Take 0.5 tablets (12.5 mg total) by mouth  daily. 30 tablet 3 02/12/2022   warfarin (COUMADIN) 10 MG tablet Take 5-10 mg by mouth See admin instructions. Take as directed in the evening : Monday take 10 mg and all other days take 5 mg.   02/12/2022 at 0800   warfarin (COUMADIN) 5 MG tablet TAKE (1) OR (1) 1/2 TABLET BY MOUTH DAILY AS DIRECTED. (Patient not taking: Reported on 02/13/2022)   Not Taking    Assessment: Pharmacy consulted to dose warfarin in patient with history of PE.  Home does listed as 10 mg every Mon and 5 mg ROW. Last dose listed as 1/23.   INR 2.1 > 1.9  Goal of Therapy:  INR 2-3 Monitor platelets by anticoagulation protocol: Yes   Plan:  Warfarin 7.5 mg x 1 dose. Monitor daily INR and s/s of bleeding.  Margot Ables, PharmD Clinical Pharmacist 02/14/2022 7:40 AM

## 2022-02-14 NOTE — Consult Note (Signed)
Cardiology Consultation:   Patient ID: LOURENE [name]; [id number]; October 28, 1975   Admit date: 02/13/2022 Date of Consult: 02/14/2022  Primary Care Provider: Carrolyn Meiers, MD Primary Cardiologist: Carlyle Dolly, MD Primary Electrophysiologist: Melida Quitter, MD   Patient Profile:   [name] [name] is a 47 y.o. female with a history of HFrEF with nonischemic cardiomyopathy, nonobstructive CAD, severe eccentric mitral regurgitation, previous DVT and submassive PE status post IVC filter and chronic anticoagulation on Coumadin, morbid obesity who is being seen today for the evaluation of loss of breath and suspected heart failure exacerbation at the request of Dr. Carles Collet.  History of Present Illness:   [name] is currently admitted to the hospitalist service presenting with worsening shortness of breath and cough since Tuesday.  Denies any definitive weight gain and states that she has been taking all her regular medications including diuretics.  Did notice a temperature of 101.8 degrees on Tuesday, this was better Wednesday, but given persistent symptoms she came in for further assessment.  Her weight actually looks to be down compared to last hospitalization.  She reports orthopnea.  Compliant with CPAP at home.  No chest pain or hemoptysis.  Also compliant with Coumadin.  She had a recent visit with Dr. Daniel Nones in the advanced heart failure clinic, I reviewed the note.  She is scheduled to undergo further assessment including cardiac MRI, TEE, and right heart catheterization over the next few weeks as an outpatient.  Recent outpatient cardiac regimen includes Toprol-XL, losartan, Jardiance, Aldactone, and Lasix.  She was started on IV Lasix per primary team, has diuresed net 4500 cc so far.  Presenting chest x-ray shows cardiomegaly with mild vascular congestion, no infiltrates.  She is presently afebrile, blood cultures sent and pending.  Past Medical History:  Diagnosis  Date   Anemia    Blood transfusion without reported diagnosis    CAD (coronary artery disease)    Nonobstructive   Closed left ankle fracture    August 30 2012   Clotting disorder [name])    DVT (deep venous thrombosis) (HCC)    L leg   Fibroid tumor    HFrEF (heart failure with reduced ejection fraction) (HCC)    Hidradenitis    Hypertension    Mitral regurgitation    NICM (nonischemic cardiomyopathy) (HCC)    NSVT (nonsustained ventricular tachycardia) (HCC)    Obesity    OSA on CPAP    Pulmonary embolism ([place]) 04/2013   Sleep apnea     Past Surgical History:  Procedure Laterality Date   ABSCESS DRAINAGE Bilateral 01/10/2015   BREAST BIOPSY Right 02/01/2020   benign   COLONOSCOPY WITH PROPOFOL N/A 06/08/2018   Procedure: COLONOSCOPY WITH PROPOFOL;  Surgeon: Daneil Dolin, MD;  Location[facility];  Service: Endoscopy;  Laterality: N/A;  2:30pm   CYST REMOVAL TRUNK     IVC FILTER PLACEMENT ([place] [facility]X)     LOWER EXTREMITY VENOGRAPHY N/A 02/07/2021   Procedure: LOWER EXTREMITY VENOGRAPHY;  Surgeon: [name], MD;  Location: [place] CV LAB;  Service: Cardiovascular;  Laterality: N/A;   PERIPHERAL VASCULAR THROMBECTOMY N/A 02/07/2021   Procedure: PERIPHERAL VASCULAR THROMBECTOMY;  Surgeon: [name], MD;  Location: [place] CV LAB;  Service: Cardiovascular;  Laterality: N/A;   RIGHT/LEFT HEART CATH AND CORONARY ANGIOGRAPHY N/A 06/19/2021   Procedure: RIGHT/LEFT HEART CATH AND CORONARY ANGIOGRAPHY;  Surgeon: Belva Crome, MD;  Location: [place] CV LAB;  Service: Cardiovascular;  Laterality: N/A;  Inpatient Medications: Scheduled Meds:  atorvastatin  20 mg Oral Daily   empagliflozin  10 mg Oral QAC breakfast   furosemide  80 mg Intravenous BID   losartan  12.5 mg Oral Daily   magnesium oxide  400 mg Oral Daily   metoprolol succinate  25 mg Oral Daily   midodrine  2.5 mg Oral BID WC   sodium chloride flush  3 mL Intravenous Q12H    spironolactone  12.5 mg Oral Daily   Warfarin - Pharmacist Dosing Inpatient   Does not apply q1600   Continuous Infusions:  sodium chloride     PRN Meds: sodium chloride, acetaminophen, HYDROcodone bit-homatropine, HYDROcodone-acetaminophen, ondansetron (ZOFRAN) IV, sodium chloride flush  Allergies:    Allergies  Allergen Reactions   Sulfa Antibiotics Itching, Swelling and Rash    Lip swelling    Social History:   Social History   Socioeconomic History   Marital status: Widowed    Spouse name: Not on file   Number of children: Not on file   Years of education: Not on file   Highest education level: Not on file  Occupational History   Not on file  Tobacco Use   Smoking status: Every Day    Years: 18.00    Types: Cigarettes   Smokeless tobacco: Never   Tobacco comments:    Smokes 2-3 cigarettes daily as of 02/13/22  Vaping Use   Vaping Use: Never used  Substance and Sexual Activity   Alcohol use: Not Currently    Alcohol/week: 2.0 standard drinks of alcohol    Types: 2 Cans of beer per week    Comment: twice a month   Drug use: No   Sexual activity: Not Currently    Birth control/protection: Pill, None  Other Topics Concern   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    Has a 46 year old [place].   Live with parents.   Was working 5 days a weeks.   Not working right now.    Attends church.    Social Determinants of Health   Financial Resource Strain: Not on file  Food Insecurity: No Food Insecurity (02/13/2022)   Hunger Vital Sign    Worried About Running Out of Food in the Last Year: Never true    Ran Out of Food in the Last Year: Never true  Transportation Needs: No Transportation Needs (02/13/2022)   PRAPARE - Hydrologist (Medical): No    Lack of Transportation (Non-Medical): No  Physical Activity: Not on file  Stress: Not on file  Social Connections: Not on file   Intimate Partner Violence: Not At Risk (02/13/2022)   Humiliation, Afraid, Rape, and Kick questionnaire    Fear of Current or Ex-Partner: No    Emotionally Abused: No    Physically Abused: No    Sexually Abused: No    Family History:   The patient's family history includes Cancer in her maternal grandmother; Diabetes in her paternal aunt and paternal uncle; Heart attack in her paternal grandmother; Hypertension in her mother; Other in her father. There is no history of Colon cancer.  ROS:  Please see the history of present illness.  All other ROS reviewed and negative.  Physical Exam/Data:   Vitals:   02/14/22 0359 02/14/22 0500 02/14/22 0921 02/14/22 1100  BP: 110/75  102/74 105/78  Pulse: 68  72 67  Resp: 18  18 20  Temp: 98.2 F (36.8 C)  98.5 F (36.9 C) (!) 97.5 F (36.4 C)  TempSrc: Oral  Oral Oral  SpO2: 95%  96% 97%  Weight:  127.9 kg    Height:        Intake/Output Summary (Last 24 hours) at 02/14/2022 1624 Last data filed at 02/14/2022 1202 Gross per 24 hour  Intake 123 ml  Output 2200 ml  Net -2077 ml   Filed Weights   02/13/22 0905 02/13/22 1541 02/14/22 0500  Weight: (!) 141.1 kg 130.3 kg 127.9 kg   Body mass index is 39.33 kg/m.   Gen: Obese, no distress. HEENT: Conjunctiva and lids normal. Neck: Supple, no elevated JVP or carotid bruits, no thyromegaly. Lungs: Scattered crackles. Cardiac: Regular rate and rhythm, no S3, 2/6 lateral/apical systolic murmur, no pericardial rub. Abdomen: Soft, bowel sounds present. Extremities: Chronic appearing edema, reportedly better in general per patient. Skin: Warm and dry. Musculoskeletal: No kyphosis. Neuropsychiatric: Alert and oriented x3, affect grossly appropriate.  EKG:  An ECG dated 02/13/2022 was personally reviewed today and demonstrated:  Sinus rhythm with left anterior fascicular block, counterclockwise rotation, question RVH, nonspecific T wave changes.  Telemetry:  I personally reviewed telemetry  which shows sinus rhythm.  Relevant CV Studies:  Echocardiogram 09/24/2021:  1. Left ventricular ejection fraction, by estimation, is 30 to 35%. The  left ventricle has moderately decreased function. Left ventricular  endocardial border not optimally defined to evaluate regional wall motion,  global hypokinesis seen. The left  ventricular internal cavity size was moderately dilated. There is mild  left ventricular hypertrophy. Left ventricular diastolic parameters are  indeterminate.   2. Right ventricular systolic function is mildly reduced. The right  ventricular size is mildly enlarged. There is mildly elevated pulmonary  artery systolic pressure. The estimated right ventricular systolic  pressure is 24.0 mmHg.   3. Left atrial size was moderately dilated.   4. Right atrial size was mildly dilated.   5. MR mechanism appears to be posterior leaflet restriction with relative  anterior leaflet override with posteriorly directed jet of MR. Review of  serial studies suggests regional wall motion abnormalities contribute to  posterior leaflet restriction. No   pulmonary vein reversal seen on this study, systolic PV blunting.. The  mitral valve is grossly normal. Moderate to severe mitral valve  regurgitation. No evidence of mitral stenosis.   6. Tricuspid valve regurgitation is mild to moderate.   7. The aortic valve is tricuspid. Aortic valve regurgitation is not  visualized. No aortic stenosis is present.   8. The inferior vena cava is dilated in size with <50% respiratory  variability, suggesting right atrial pressure of 15 mmHg.   Laboratory Data:  Chemistry Recent Labs  Lab 02/13/22 0915 02/14/22 0422  NA 132* 135  K 4.0 3.9  CL 100 99  CO2 22 27  GLUCOSE 96 89  BUN 15 15  CREATININE 1.02* 1.09*  CALCIUM 8.7* 8.6*  GFRNONAA >60 >60  ANIONGAP 10 9    Recent Labs  Lab 02/13/22 0915  PROT 8.2*  ALBUMIN 3.3*  AST 23  ALT 15  ALKPHOS 86  BILITOT 1.9*    Hematology Recent Labs  Lab 02/13/22 0915  WBC 3.8*  RBC 4.13  HGB 9.6*  HCT 33.1*  MCV 80.1  MCH 23.2*  MCHC 29.0*  RDW 24.1*  PLT 195    BNP Recent Labs  Lab 02/13/22 0915  BNP 1,996.0*     Radiology/Studies:  DG Chest  2 View  Result Date: 02/13/2022 CLINICAL DATA:  Cough with shortness of breath EXAM: CHEST - 2 VIEW COMPARISON:  12/16/2021 FINDINGS: Mild bilateral interstitial thickening. No focal consolidation. No pleural effusion or pneumothorax. Prominence of the central pulmonary vasculature. Stable cardiomegaly. No acute osseous abnormality. IMPRESSION: 1. Cardiomegaly with mild pulmonary vascular congestion. Electronically Signed   By: Kathreen Devoid M.D.   On: 02/13/2022 10:19    Assessment and Plan:   1.  Presentation with recent worsening shortness of breath and orthopnea although no major weight gain and reportedly improved leg swelling on current regimen.  Patient describes fever of 101.8 on Tuesday and intermittent cough.  Chest x-ray shows no acute infiltrates, but vascular congestion.  She has a known nonischemic cardiomyopathy with HFrEF and LVEF 30 to 35% by last assessment, also severe eccentric mitral regurgitation.  Blood cultures have been sent and are pending.  She has had some symptomatic improvement with IV diuresis.  2.  Nonobstructive CAD by cardiac catheterization in May 2023.  3.  History of submassive saddle pulmonary embolus and prior DVT status post IVC filter back in 2015.  She is chronically anticoagulated on Coumadin, underwent thrombophilia workup previously at [facility] and advised against DOAC's in the setting of obesity.  BMI 43.  At this point continue IV Lasix, follow-up urine output and clinical response.  Follow-up blood cultures as well.  Procalcitonin is normal and she is otherwise hemodynamically stable.  Respiratory panel normal.  Could have simply been recent viral URI, but would also be concerned about ruling out endocarditis given  significant valvular pathology at baseline if she is found to be bacteremic.  Send ESR with morning lab work.  She has already been evaluated in the advanced heart failure clinic with plan for outpatient cardiac MRI, TEE, and right heart catheterization in the next few weeks.  If she does not improve clinically at this time with standard measures, transfer to [facility] could be considered for further inpatient workup.  We will continue to follow.  Signed, Rozann Lesches, MD  02/14/2022 4:24 PM

## 2022-02-14 NOTE — Progress Notes (Signed)
PROGRESS NOTE  [name] [name] LDJ:[id number] DOB: 01/25/1975 DOA: 02/13/2022 PCP: Carrolyn Meiers, MD  Brief History:  47 y.o. female, known history of DVT PE 2015 IVC filter prior on Xarelto --- recurrence DVT/PE January 2023 status post thrombectomy on lifelong anticoagulation with Coumadin secondary to weight >250 pounds, OSA on CPAP, Known uterine fibroids, Smoker With recent admission to [facility] for decompensated HF 12/8-12/22/23 presenting with shortness of breath that has worsened since last Thursday, 02/07/2022.  She states that on the evening of 02/13/22, she developed worsening cough, shortness of breath, fever up to 101.8 F.  She had an episode of nausea and vomiting.  She also had 3 loose bowel movements.  There was no hemoptysis.  There is no hematochezia or melena.  There Is no hematemesis.  The patient does endorse a degree of indiscretion with fluid intake.  However, she states that she has been compliant with her furosemide 40 mg twice daily at home.  She has had some intermittent right-sided chest pain in the past week.  She denies any headache, neck pain, dysuria, hematuria.  At the time of discharge, she was 156 kg.  She states that her most recent weight was 141 kg at home. In the ED, the patient was afebrile and hemodynamically stable with oxygen saturation 99% on room air.  WBC 3.8, hemoglobin 9.6, platelets 195,000.  Sodium 132, potassium 4.0, bicarbonate 22, serum creatinine 1.02.  INR was 2.1.  BNP 1996.  Chest x-ray showed pulmonary vascular congestion.  The patient was given furosemide 80 mg IV x 1 and admitted for further evaluation and treatment.   Assessment/Plan: Acute on chronic Heart failure with reduced EF  -Nonischemic, LHC in 2023 with nonobstructive CAD.  -09/24/2021 echo EF 30-35%, mildly reduced RVF moderate to severe MR -Continue IV furosemide -Daily weights NEG 5 lbs -Accurate I's and O's -Continue spironolactone, metoprolol succinate, losartan,  Jardiance   Acute bronchitis -COVID and flu negative -Check viral panel -Start Hycodan for cough -Check PCT <0.10 -Chest x-ray without consolidation--personally reviewed   DVT/PE (2015/2023) Chronic anticoagulation. -Submassive saddle PE on 04/27/2013  - Evaluated at [facility] where thrombophilia work-up was negative (Lupus inhibitor, anticardiolipin antibodies, and anti beta-2 glycoprotein antibodies were negative; protein C activity 85%, protein S activity 76%, factor V Leiden, and prothrombin gene mutation ) - IVC filter in place; could not be removed due to significant thrombus burden on filter at [facility].  - Previously advised against DOACs due to severity of thrombus burden and weight >250lbs.   obesity/OSA/pulmonary hypertension - continue CPAP nightly    Morbid Obesity -BMI 48 -lifestyle modification   Fever -Obtain blood cultures--neg to date -COVID, flu, RSV neg -UA  -Check PCT <0.10   Morbid Obesity -BMI 43.48 -lifestyle modification     Family Communication:  no  Family at bedside  Consultants:  cardiology  Code Status:  FULL  DVT Prophylaxis:  warfarin   Procedures: As Listed in Progress Note Above  Antibiotics: None    Subjective: Continues to have cough-dry.  Denies hemoptysis.  States cough and sob are improving.  Denies n/v/d, abd pain ,cp, headache  Objective: Vitals:   02/14/22 0359 02/14/22 0500 02/14/22 0921 02/14/22 1100  BP: 110/75  102/74 105/78  Pulse: 68  72 67  Resp: '18  18 20  '$ Temp: 98.2 F (36.8 C)  98.5 F (36.9 C) (!) 97.5 F (36.4 C)  TempSrc: Oral  Oral Oral  SpO2: 95%  96% 97%  Weight:  127.9 kg    Height:        Intake/Output Summary (Last 24 hours) at 02/14/2022 1559 Last data filed at 02/14/2022 1202 Gross per 24 hour  Intake 123 ml  Output 2200 ml  Net -2077 ml   Weight change:  Exam:  General:  Pt is alert, follows commands appropriately, not in acute distress HEENT: No icterus, No thrush, No neck mass,  [place]/AT Cardiovascular: RRR, S1/S2, no rubs, no gallops Respiratory: bibasilar crackles R>L. No wheeze Abdomen: Soft/+BS, non tender, non distended, no guarding Extremities: trace LE edema, No lymphangitis, No petechiae, No rashes, no synovitis   Data Reviewed: I have personally reviewed following labs and imaging studies Basic Metabolic Panel: Recent Labs  Lab 02/13/22 0915 02/14/22 0422  NA 132* 135  K 4.0 3.9  CL 100 99  CO2 22 27  GLUCOSE 96 89  BUN 15 15  CREATININE 1.02* 1.09*  CALCIUM 8.7* 8.6*  MG  --  2.2   Liver Function Tests: Recent Labs  Lab 02/13/22 0915  AST 23  ALT 15  ALKPHOS 86  BILITOT 1.9*  PROT 8.2*  ALBUMIN 3.3*   Recent Labs  Lab 02/13/22 0915  LIPASE 27   No results for input(s): "AMMONIA" in the last 168 hours. Coagulation Profile: Recent Labs  Lab 02/13/22 0933 02/14/22 0422  INR 2.1* 1.9*   CBC: Recent Labs  Lab 02/13/22 0915  WBC 3.8*  HGB 9.6*  HCT 33.1*  MCV 80.1  PLT 195   Cardiac Enzymes: No results for input(s): "CKTOTAL", "CKMB", "CKMBINDEX", "TROPONINI" in the last 168 hours. BNP: Invalid input(s): "POCBNP" CBG: No results for input(s): "GLUCAP" in the last 168 hours. HbA1C: No results for input(s): "HGBA1C" in the last 72 hours. Urine analysis:    Component Value Date/Time   COLORURINE YELLOW 09/22/2021 1506   APPEARANCEUR HAZY (A) 09/22/2021 1506   APPEARANCEUR Cloudy (A) 07/21/2014 1326   LABSPEC 1.008 09/22/2021 1506   PHURINE 6.0 09/22/2021 1506   GLUCOSEU NEGATIVE 09/22/2021 1506   HGBUR NEGATIVE 09/22/2021 1506   BILIRUBINUR NEGATIVE 09/22/2021 1506   BILIRUBINUR Negative 07/21/2014 1326   KETONESUR NEGATIVE 09/22/2021 1506   PROTEINUR NEGATIVE 09/22/2021 1506   UROBILINOGEN 0.2 06/30/2014 0910   NITRITE NEGATIVE 09/22/2021 1506   LEUKOCYTESUR TRACE (A) 09/22/2021 1506   Sepsis Labs: '@LABRCNTIP'$ (procalcitonin:4,lacticidven:4) ) Recent Results (from the past 240 hour(s))  Resp panel by RT-PCR  (RSV, Flu A&B, Covid) Anterior Nasal Swab     Status: None   Collection Time: 02/13/22  9:15 AM   Specimen: Anterior Nasal Swab  Result Value Ref Range Status   SARS Coronavirus 2 by RT PCR NEGATIVE NEGATIVE Final    Comment: (NOTE) SARS-CoV-2 target nucleic acids are NOT DETECTED.  The SARS-CoV-2 RNA is generally detectable in upper respiratory specimens during the acute phase of infection. The lowest concentration of SARS-CoV-2 viral copies this assay can detect is 138 copies/mL. A negative result does not preclude SARS-Cov-2 infection and should not be used as the sole basis for treatment or other patient management decisions. A negative result may occur with  improper specimen collection/handling, submission of specimen other than nasopharyngeal swab, presence of viral mutation(s) within the areas targeted by this assay, and inadequate number of viral copies(<138 copies/mL). A negative result must be combined with clinical observations, patient history, and epidemiological information. The expected result is Negative.  Fact Sheet for Patients:  EntrepreneurPulse.com.au  Fact Sheet for Healthcare Providers:  IncredibleEmployment.be  This test is no t yet approved or cleared by the [place] and  has been authorized for detection and/or diagnosis of SARS-CoV-2 by FDA under an Emergency Use Authorization (EUA). This EUA will remain  in effect (meaning this test can be used) for the duration of the COVID-19 declaration under Section 564(b)(1) of the Act, 21 U.S.C.section 360bbb-3(b)(1), unless the authorization is terminated  or revoked sooner.       Influenza A by PCR NEGATIVE NEGATIVE Final   Influenza B by PCR NEGATIVE NEGATIVE Final    Comment: (NOTE) The Xpert Xpress SARS-CoV-2/FLU/RSV plus assay is intended as an aid in the diagnosis of influenza from Nasopharyngeal swab specimens and should not be used as a sole basis for  treatment. Nasal washings and aspirates are unacceptable for Xpert Xpress SARS-CoV-2/FLU/RSV testing.  Fact Sheet for Patients: EntrepreneurPulse.com.au  Fact Sheet for Healthcare Providers: IncredibleEmployment.be  This test is not yet approved or cleared by the [place] FDA and has been authorized for detection and/or diagnosis of SARS-CoV-2 by FDA under an Emergency Use Authorization (EUA). This EUA will remain in effect (meaning this test can be used) for the duration of the COVID-19 declaration under Section 564(b)(1) of the Act, 21 U.S.C. section 360bbb-3(b)(1), unless the authorization is terminated or revoked.     Resp Syncytial Virus by PCR NEGATIVE NEGATIVE Final    Comment: (NOTE) Fact Sheet for Patients: EntrepreneurPulse.com.au  Fact Sheet for Healthcare Providers: IncredibleEmployment.be  This test is not yet approved or cleared by the [place] FDA and has been authorized for detection and/or diagnosis of SARS-CoV-2 by FDA under an Emergency Use Authorization (EUA). This EUA will remain in effect (meaning this test can be used) for the duration of the COVID-19 declaration under Section 564(b)(1) of the Act, 21 U.S.C. section 360bbb-3(b)(1), unless the authorization is terminated or revoked.  Performed at [facility], [address], [place], [place] [address]   Culture, blood (Routine X 2) w Reflex to ID Panel     Status: None (Preliminary result)   Collection Time: 02/13/22 12:58 PM   Specimen: BLOOD RIGHT ARM  Result Value Ref Range Status   Specimen Description   Final    BLOOD RIGHT ARM Performed at [facility], [address], [place], [place] [address]    Special Requests   Final    BOTTLES DRAWN AEROBIC AND ANAEROBIC Blood Culture adequate volume Performed at [facility], [address], [place], [place] [address]    Culture   Final    NO GROWTH 1 DAY Performed at  [facility], [place] [address] [place], [place] [address]    Report Status PENDING  Incomplete  Culture, blood (Routine X 2) w Reflex to ID Panel     Status: None (Preliminary result)   Collection Time: 02/13/22  1:03 PM   Specimen: BLOOD  Result Value Ref Range Status   Specimen Description   Final    BLOOD BLOOD LEFT ARM Performed at [facility], [address], [place], [place] [address]    Special Requests   Final    NONE Performed at [facility], [address] [place], [place] [address]    Culture   Final    NO GROWTH 1 DAY Performed at [facility], [place] [address], [place], [place] [address]    Report Status PENDING  Incomplete     Scheduled Meds:  atorvastatin  20 mg Oral Daily   empagliflozin  10 mg  Oral QAC breakfast   furosemide  80 mg Intravenous BID   losartan  12.5 mg Oral Daily   magnesium oxide  400 mg Oral Daily   metoprolol succinate  25 mg Oral Daily   midodrine  2.5 mg Oral BID WC   sodium chloride flush  3 mL Intravenous Q12H   spironolactone  12.5 mg Oral Daily   warfarin  7.5 mg Oral ONCE-1600   Warfarin - Pharmacist Dosing Inpatient   Does not apply q1600   Continuous Infusions:  sodium chloride      Procedures/Studies: DG Chest 2 View  Result Date: 02/13/2022 CLINICAL DATA:  Cough with shortness of breath EXAM: CHEST - 2 VIEW COMPARISON:  12/16/2021 FINDINGS: Mild bilateral interstitial thickening. No focal consolidation. No pleural effusion or pneumothorax. Prominence of the central pulmonary vasculature. Stable cardiomegaly. No acute osseous abnormality. IMPRESSION: 1. Cardiomegaly with mild pulmonary vascular congestion. Electronically Signed   By: Kathreen Devoid M.D.   On: 02/13/2022 10:19   MM DIAG BREAST TOMO BILATERAL  Result Date: 02/07/2022 CLINICAL DATA:  Intermittent clear nipple discharge on the right with ongoing symptoms of mastitis for over a year. She had her last round of antibiotics completed on 01/18/2022. She reports  severe swelling and enlargement of the right breast prior to the antibiotics which significantly improved with the antibiotics and has not recurred. She has had a right breast mass and right axillary lymph node biopsy as well as an abscess aspirated on 02/01/2020. The biopsies were negative for malignancy. The aspiration demonstrated moderate white blood cells, moderate gram positive cocci, few gram-negative rods, few gram positive rods and few Actinomyces species. She also had a right breast abscess aspirated on 01/01/2022. She also reports a previous skin punch biopsy. EXAM: DIGITAL DIAGNOSTIC BILATERAL MAMMOGRAM WITH TOMOSYNTHESIS; ULTRASOUND RIGHT BREAST LIMITED TECHNIQUE: Bilateral digital diagnostic mammography and breast tomosynthesis was performed.; Targeted ultrasound examination of the right breast was performed COMPARISON:  Previous exam(s). ACR Breast Density Category b: There are scattered areas of fibroglandular density. FINDINGS: Severe diffuse right breast edema. No mammographically visible fluid collection or mass. The left breast is unremarkable. On physical exam, the patient has diffuse edema and skin thickening in the right breast with mild dark red coloration in the anterior portions of the breast. No palpable mass. Targeted ultrasound is performed, showing diffuse right breast edema with difficulty penetrating the breast. No visible masses or fluid collections. IMPRESSION: 1. Severe diffuse right breast edema with no mammographically or sonographically visible mass or fluid collection. 2. No evidence of malignancy in the left breast. RECOMMENDATION: 1. Clinical management of the patient's ongoing right breast infection. 2. Bilateral screening mammogram in 1 year. I have discussed the findings and recommendations with the patient. If applicable, a reminder letter will be sent to the patient regarding the next appointment. BI-RADS CATEGORY  2: Benign. Electronically Signed   By: Claudie Revering M.D.    On: 02/07/2022 10:23  US BREAST LTD UNI RIGHT INC AXILLA  Result Date: 02/07/2022 CLINICAL DATA:  Intermittent clear nipple discharge on the right with ongoing symptoms of mastitis for over a year. She had her last round of antibiotics completed on 01/18/2022. She reports severe swelling and enlargement of the right breast prior to the antibiotics which significantly improved with the antibiotics and has not recurred. She has had a right breast mass and right axillary lymph node biopsy as well as an abscess aspirated on 02/01/2020. The biopsies were negative for malignancy. The aspiration demonstrated moderate  white blood cells, moderate gram positive cocci, few gram-negative rods, few gram positive rods and few Actinomyces species. She also had a right breast abscess aspirated on 01/01/2022. She also reports a previous skin punch biopsy. EXAM: DIGITAL DIAGNOSTIC BILATERAL MAMMOGRAM WITH TOMOSYNTHESIS; ULTRASOUND RIGHT BREAST LIMITED TECHNIQUE: Bilateral digital diagnostic mammography and breast tomosynthesis was performed.; Targeted ultrasound examination of the right breast was performed COMPARISON:  Previous exam(s). ACR Breast Density Category b: There are scattered areas of fibroglandular density. FINDINGS: Severe diffuse right breast edema. No mammographically visible fluid collection or mass. The left breast is unremarkable. On physical exam, the patient has diffuse edema and skin thickening in the right breast with mild dark red coloration in the anterior portions of the breast. No palpable mass. Targeted ultrasound is performed, showing diffuse right breast edema with difficulty penetrating the breast. No visible masses or fluid collections. IMPRESSION: 1. Severe diffuse right breast edema with no mammographically or sonographically visible mass or fluid collection. 2. No evidence of malignancy in the left breast. RECOMMENDATION: 1. Clinical management of the patient's ongoing right breast infection.  2. Bilateral screening mammogram in 1 year. I have discussed the findings and recommendations with the patient. If applicable, a reminder letter will be sent to the patient regarding the next appointment. BI-RADS CATEGORY  2: Benign. Electronically Signed   By: Claudie Revering M.D.   On: 02/07/2022 10:23   Orson Eva, DO  [facility]  If 7PM-7AM, please contact night-coverage [email or web address] Password [email or web address] 02/14/2022, 3:59 PM   LOS: 1 day

## 2022-02-14 NOTE — TOC Progression Note (Signed)
Transition of Care [facility]) - Progression Note    Patient Details  Name: [name] [name] MRN: [id number] Date of Birth: [birth year]  [facility] CM/SW Contact  Salome Arnt, [place] Phone Number: 02/14/2022, 8:14 AM  Clinical Narrative: LCSW completed CHF screening with pt. Pt reports she has been educated on importance of daily weights, but does not have a scale at home. She states she plans to get one now. Pt feels she follows a heart healthy diet "pretty well." She or her mother do most of the cooking at home. Pt states she takes medications as prescribed. Discussed HHRN for continued CHF education, but pt declines. Living Well with CHF book ordered. TOC will continue to follow.       Expected Discharge Plan: Home/Self Care Barriers to Discharge: Continued Medical Work up  Expected Discharge Plan and Services In-house Referral: Clinical Social Work Discharge Planning Services: CM Consult   Living arrangements for the past 2 months: Single Family Home                                       Social Determinants of Health (SDOH) Interventions SDOH Screenings   Food Insecurity: No Food Insecurity (02/13/2022)  Housing: Low Risk  (02/13/2022)  Transportation Needs: No Transportation Needs (02/13/2022)  Utilities: Not At Risk (02/13/2022)  Depression (PHQ2-9): Low Risk  (01/24/2022)  Tobacco Use: High Risk (02/13/2022)    Readmission Risk Interventions    02/13/2022   12:41 PM 12/31/2021    1:52 PM  Readmission Risk Prevention Plan  Transportation Screening Complete   HRI or Home Care Consult Complete Complete  Social Work Consult for [place] Planning/Counseling Complete Complete  Palliative Care Screening Not Applicable Not Applicable  Medication Review Press photographer) Complete Complete

## 2022-02-15 ENCOUNTER — Ambulatory Visit: Payer: Medicare HMO | Admitting: Pulmonary Disease

## 2022-02-15 DIAGNOSIS — I2699 Other pulmonary embolism without acute cor pulmonale: Secondary | ICD-10-CM | POA: Diagnosis not present

## 2022-02-15 DIAGNOSIS — I5043 Acute on chronic combined systolic (congestive) and diastolic (congestive) heart failure: Secondary | ICD-10-CM

## 2022-02-15 DIAGNOSIS — I5023 Acute on chronic systolic (congestive) heart failure: Secondary | ICD-10-CM | POA: Diagnosis not present

## 2022-02-15 LAB — CBC
HCT: 35.4 % — ABNORMAL LOW (ref 36.0–46.0)
Hemoglobin: 10.1 g/dL — ABNORMAL LOW (ref 12.0–15.0)
MCH: 23.1 pg — ABNORMAL LOW (ref 26.0–34.0)
MCHC: 28.5 g/dL — ABNORMAL LOW (ref 30.0–36.0)
MCV: 80.8 fL (ref 80.0–100.0)
Platelets: 195 10*3/uL (ref 150–400)
RBC: 4.38 MIL/uL (ref 3.87–5.11)
RDW: 23.5 % — ABNORMAL HIGH (ref 11.5–15.5)
WBC: 3.4 10*3/uL — ABNORMAL LOW (ref 4.0–10.5)
nRBC: 0 % (ref 0.0–0.2)

## 2022-02-15 LAB — PROTIME-INR
INR: 2.2 — ABNORMAL HIGH (ref 0.8–1.2)
Prothrombin Time: 23.9 seconds — ABNORMAL HIGH (ref 11.4–15.2)

## 2022-02-15 LAB — RESPIRATORY PANEL BY PCR

## 2022-02-15 LAB — BASIC METABOLIC PANEL
Anion gap: 8 (ref 5–15)
BUN: 19 mg/dL (ref 6–20)
CO2: 28 mmol/L (ref 22–32)
Calcium: 8.4 mg/dL — ABNORMAL LOW (ref 8.9–10.3)
Chloride: 98 mmol/L (ref 98–111)
Creatinine, Ser: 1.08 mg/dL — ABNORMAL HIGH (ref 0.44–1.00)
GFR, Estimated: >60 mL/min (ref >60)
Glucose, Bld: 69 mg/dL — ABNORMAL LOW (ref 70–99)
Potassium: 3.6 mmol/L (ref 3.5–5.1)
Sodium: 134 mmol/L — ABNORMAL LOW (ref 135–145)

## 2022-02-15 LAB — MAGNESIUM: Magnesium: 2.1 mg/dL (ref 1.7–2.4)

## 2022-02-15 LAB — SEDIMENTATION RATE: Sed Rate: 7 mm/hr (ref 0–22)

## 2022-02-15 MED ORDER — FUROSEMIDE 40 MG PO TABS
40.0000 mg | ORAL_TABLET | Freq: Two times a day (BID) | ORAL | Status: DC
  Administered 2022-02-15 – 2022-02-16 (×2): 40 mg via ORAL
  Filled 2022-02-15 (×2): qty 1

## 2022-02-15 MED ORDER — WARFARIN SODIUM 5 MG PO TABS: 10.0000 mg | ORAL_TABLET | ORAL | Status: DC

## 2022-02-15 MED ORDER — WARFARIN SODIUM 5 MG PO TABS
5.0000 mg | ORAL_TABLET | ORAL | Status: DC
  Administered 2022-02-15: 5 mg via ORAL
  Filled 2022-02-15: qty 1

## 2022-02-15 NOTE — Plan of Care (Signed)
  Problem: Activity: Goal: Ability to return to baseline activity level will improve Outcome: Progressing   Problem: Health Behavior/Discharge Planning: Goal: Ability to safely manage health-related needs after discharge will improve Outcome: Progressing

## 2022-02-15 NOTE — Plan of Care (Signed)
  Problem: Activity: Goal: Ability to return to baseline activity level will improve Outcome: Progressing   Problem: Education: Goal: Knowledge of General Education information will improve Description: Including pain rating scale, medication(s)/side effects and non-pharmacologic comfort measures Outcome: Progressing

## 2022-02-15 NOTE — Progress Notes (Addendum)
Rounding Note    Patient Name: [name] [name] Date of Encounter: 02/15/2022  [place] Cardiologist: Carlyle Dolly, MD   Subjective   Patient reports is feeling a little better today. She denies chest pain.   Inpatient Medications    Scheduled Meds:  atorvastatin  20 mg Oral Daily   empagliflozin  10 mg Oral QAC breakfast   furosemide  80 mg Intravenous BID   losartan  12.5 mg Oral Daily   magnesium oxide  400 mg Oral Daily   metoprolol succinate  25 mg Oral Daily   midodrine  2.5 mg Oral BID WC   sodium chloride flush  3 mL Intravenous Q12H   spironolactone  12.5 mg Oral Daily   [START ON 02/18/2022] warfarin  10 mg Oral Q Mon   And   warfarin  5 mg Oral Once per day on Sun Tue Wed Thu Fri Sat   Warfarin - Pharmacist Dosing Inpatient   Does not apply q1600   Continuous Infusions:  sodium chloride     PRN Meds: sodium chloride, acetaminophen, HYDROcodone bit-homatropine, HYDROcodone-acetaminophen, ondansetron (ZOFRAN) IV, sodium chloride flush   Vital Signs    Vitals:   02/15/22 0422 02/15/22 0500 02/15/22 0832 02/15/22 0858  BP: 106/73   93/70  Pulse: 62   70  Resp: 18     Temp: 98.4 F (36.9 C)     TempSrc:      SpO2: 97%  93% 98%  Weight:  126.6 kg    Height:        Intake/Output Summary (Last 24 hours) at 02/15/2022 0933 Last data filed at 02/15/2022 0500 Gross per 24 hour  Intake 723 ml  Output 700 ml  Net 23 ml      02/15/2022    5:00 AM 02/14/2022    5:00 AM 02/13/2022    3:41 PM  Last 3 Weights  Weight (lbs) 279 lb 1.6 oz 282 lb 287 lb 4.2 oz  Weight (kg) 126.6 kg 127.914 kg 130.3 kg      Telemetry    NSR PVCs, ven bigeminy, HR 60s - Personally Reviewed  ECG    No new - Personally Reviewed  Physical Exam   GEN: No acute distress.   Neck: No JVD Cardiac: RRR, 2/6 apical systolic murmur, no gallops.  Respiratory: wheezing and diminished breath sounds at bases. GI: Soft, nontender, non-distended  MS: Chronic  appearing edema. Neuro:  Nonfocal  Psych: Normal affect   Labs    Chemistry Recent Labs  Lab 02/13/22 0915 02/14/22 0422 02/15/22 0417  NA 132* 135 134*  K 4.0 3.9 3.6  CL 100 99 98  CO2 '22 27 28  '$ GLUCOSE 96 89 69*  BUN '15 15 19  '$ CREATININE 1.02* 1.09* 1.08*  CALCIUM 8.7* 8.6* 8.4*  MG  --  2.2 2.1  PROT 8.2*  --   --   ALBUMIN 3.3*  --   --   AST 23  --   --   ALT 15  --   --   ALKPHOS 86  --   --   BILITOT 1.9*  --   --   GFRNONAA >60 >60 >60  ANIONGAP '10 9 8    '$ Hematology Recent Labs  Lab 02/13/22 0915  WBC 3.8*  RBC 4.13  HGB 9.6*  HCT 33.1*  MCV 80.1  MCH 23.2*  MCHC 29.0*  RDW 24.1*  PLT 195   BNP Recent Labs  Lab 02/13/22 0915  BNP  1,996.0*    Radiology    DG Chest 2 View  Result Date: 02/13/2022 CLINICAL DATA:  Cough with shortness of breath EXAM: CHEST - 2 VIEW COMPARISON:  12/16/2021 FINDINGS: Mild bilateral interstitial thickening. No focal consolidation. No pleural effusion or pneumothorax. Prominence of the central pulmonary vasculature. Stable cardiomegaly. No acute osseous abnormality. IMPRESSION: 1. Cardiomegaly with mild pulmonary vascular congestion. Electronically Signed   By: Kathreen Devoid M.D.   On: 02/13/2022 10:19    Cardiac Studies   Echocardiogram 09/2021: 1. Left ventricular ejection fraction, by estimation, is 30 to 35%. The  left ventricle has moderately decreased function. Left ventricular  endocardial border not optimally defined to evaluate regional wall motion,  global hypokinesis seen. The left  ventricular internal cavity size was moderately dilated. There is mild  left ventricular hypertrophy. Left ventricular diastolic parameters are  indeterminate.   2. Right ventricular systolic function is mildly reduced. The right  ventricular size is mildly enlarged. There is mildly elevated pulmonary  artery systolic pressure. The estimated right ventricular systolic  pressure is 16.9 mmHg.   3. Left atrial size was  moderately dilated.   4. Right atrial size was mildly dilated.   5. MR mechanism appears to be posterior leaflet restriction with relative  anterior leaflet override with posteriorly directed jet of MR. Review of  serial studies suggests regional wall motion abnormalities contribute to  posterior leaflet restriction. No   pulmonary vein reversal seen on this study, systolic PV blunting.. The  mitral valve is grossly normal. Moderate to severe mitral valve  regurgitation. No evidence of mitral stenosis.   6. Tricuspid valve regurgitation is mild to moderate.   7. The aortic valve is tricuspid. Aortic valve regurgitation is not  visualized. No aortic stenosis is present.   8. The inferior vena cava is dilated in size with <50% respiratory  variability, suggesting right atrial pressure of 15 mmHg.   Assessment & Plan    SOB Acute on chronic HFrEF NICM Severe MR - she has known NICM with HFrEF LVEF 30-35% undergoing work up by advanced heart failure team as outpatient for cardiomyopathy and severe MR - patient presented with worsening SOB and transient fever - BNP 1996, CXR with pulmonary vascualr congestion - IV lasix '80mg'$  BID - Toprol '25mg'$  daily, Jardiance '10mg'$  daily, losartan 12.'5mg'$  daily, spironolactone 12.'5mg'$  daily, middorine 2.'5mg'$  BID - Net -4.3L - kidney function stable - patient appears euvolemic on exam, can possible switch to oral lasix  Nonobstructive CAD - by cath in 2023  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  Interval Hospital course reviewed.  She does feel somewhat better  symptomatically.  Last ReDs clip reading was 33.  Net diuresis of proximately 4000 cc since admission with IV Lasix.  Weight is down as well.  She has had no recurrent fevers under observation.  Hemodynamically stable with low normal blood pressure, systolic in the 67E to low 100s.  Rhythm stable by telemetry.  Follow-up lab work shows potassium 3.6, BUN 19, creatinine 1.08, blood cultures still negative.  INR 2.2.  Convert back to oral diuretic outpatient regimen.  Obtain follow-up transthoracic echocardiogram to ensure no major change in status.  If she continues to improve, would plan on proceeding with outpatient workup as already scheduled by the advanced heart failure team.  Satira Sark, M.D., F.A.C.C.

## 2022-02-15 NOTE — Progress Notes (Signed)
PROGRESS NOTE  [name] [name] [id number]:[id number] DOB: January 07, 1976 DOA: 02/13/2022 PCP: Carrolyn Meiers, MD  Brief History:  47 y.o. female, known history of DVT PE 2015 IVC filter prior on Xarelto --- recurrence DVT/PE January 2023 status post thrombectomy on lifelong anticoagulation with Coumadin secondary to weight >250 pounds, OSA on CPAP, Known uterine fibroids, Smoker With recent admission to [facility] for decompensated HF 12/8-12/22/23 presenting with shortness of breath that has worsened since last Thursday, 02/07/2022.  She states that on the evening of 02/13/22, she developed worsening cough, shortness of breath, fever up to 101.8 F.  She had an episode of nausea and vomiting.  She also had 3 loose bowel movements.  There was no hemoptysis.  There is no hematochezia or melena.  There Is no hematemesis.  The patient does endorse a degree of indiscretion with fluid intake.  However, she states that she has been compliant with her furosemide 40 mg twice daily at home.  She has had some intermittent right-sided chest pain in the past week.  She denies any headache, neck pain, dysuria, hematuria.  At the time of discharge, she was 156 kg.  She states that her most recent weight was 141 kg at home. In the ED, the patient was afebrile and hemodynamically stable with oxygen saturation 99% on room air.  WBC 3.8, hemoglobin 9.6, platelets 195,000.  Sodium 132, potassium 4.0, bicarbonate 22, serum creatinine 1.02.  INR was 2.1.  BNP 1996.  Chest x-ray showed pulmonary vascular congestion.  The patient was given furosemide 80 mg IV x 1 and admitted for further evaluation and treatment.   Assessment/Plan: Acute on chronic Heart failure with reduced EF  -Nonischemic, LHC in 2023 with nonobstructive CAD.  -09/24/2021 echo EF 30-35%, mildly reduced RVF moderate to severe MR -Continue IV furosemide -Daily weights NEG 8 lbs -Accurate I's and O's -Continue spironolactone, metoprolol succinate, losartan,  Jardiance   Acute bronchitis -COVID and flu negative -Check viral panel--POS for Coronavirus OC43 -Continue Hycodan for cough -Check PCT <0.10 -Chest x-ray without consolidation--personally reviewed -ESR 7   DVT/PE (2015/2023) Chronic anticoagulation. -Submassive saddle PE on 04/27/2013  - Evaluated at [facility] where thrombophilia work-up was negative (Lupus inhibitor, anticardiolipin antibodies, and anti beta-2 glycoprotein antibodies were negative; protein C activity 85%, protein S activity 76%, factor V Leiden, and prothrombin gene mutation ) - IVC filter in place; could not be removed due to significant thrombus burden on filter at [facility]  - Previously advised against DOACs due to severity of thrombus burden and weight >250lbs.   obesity/OSA/pulmonary hypertension - continue CPAP nightly    Morbid Obesity -BMI 48 -lifestyle modification   Fever -Obtain blood cultures--neg to date -COVID, flu, RSV neg -Due to viral bronchitis--resp panel POS for Coronavirus OC43 -Check PCT <0.10   Morbid Obesity -BMI 43.48 -lifestyle modification         Family Communication:  no  Family at bedside   Consultants:  cardiology   Code Status:  FULL   DVT Prophylaxis:  warfarin     Procedures: As Listed in Progress Note Above   Antibiotics: None       Subjective: Patient denies fevers, chills, headache, chest pain, dyspnea, nausea, vomiting, diarrhea, abdominal pain, dysuria, hematuria, hematochezia, and melena. Cough is improving  Objective: Vitals:   02/15/22 0832 02/15/22 0858 02/15/22 1209 02/15/22 1227  BP:  93/70 100/81 103/74  Pulse:  70 66 68  Resp:    18  Temp:    97.6 F (36.4 C)  TempSrc:    Oral  SpO2: 93% 98%  96%  Weight:      Height:        Intake/Output Summary (Last 24 hours) at 02/15/2022 1854 Last data filed at 02/15/2022 1100 Gross per 24 hour  Intake 960 ml  Output 2200 ml  Net -1240 ml   Weight change: -14.5 kg Exam:  General:  Pt is alert,  follows commands appropriately, not in acute distress HEENT: No icterus, No thrush, No neck mass, [place]/AT Cardiovascular: RRR, S1/S2, no rubs, no gallops Respiratory: bibasilar rales. No wheeze Abdomen: Soft/+BS, non tender, non distended, no guarding Extremities: trace LE edema, No lymphangitis, No petechiae, No rashes, no synovitis   Data Reviewed: I have personally reviewed following labs and imaging studies Basic Metabolic Panel: Recent Labs  Lab 02/13/22 0915 02/14/22 0422 02/15/22 0417  NA 132* 135 134*  K 4.0 3.9 3.6  CL 100 99 98  CO2 '22 27 28  '$ GLUCOSE 96 89 69*  BUN '15 15 19  '$ CREATININE 1.02* 1.09* 1.08*  CALCIUM 8.7* 8.6* 8.4*  MG  --  2.2 2.1   Liver Function Tests: Recent Labs  Lab 02/13/22 0915  AST 23  ALT 15  ALKPHOS 86  BILITOT 1.9*  PROT 8.2*  ALBUMIN 3.3*   Recent Labs  Lab 02/13/22 0915  LIPASE 27   No results for input(s): "AMMONIA" in the last 168 hours. Coagulation Profile: Recent Labs  Lab 02/13/22 0933 02/14/22 0422 02/15/22 0417  INR 2.1* 1.9* 2.2*   CBC: Recent Labs  Lab 02/13/22 0915 02/15/22 1103  WBC 3.8* 3.4*  HGB 9.6* 10.1*  HCT 33.1* 35.4*  MCV 80.1 80.8  PLT 195 195   Cardiac Enzymes: No results for input(s): "CKTOTAL", "CKMB", "CKMBINDEX", "TROPONINI" in the last 168 hours. BNP: Invalid input(s): "POCBNP" CBG: No results for input(s): "GLUCAP" in the last 168 hours. HbA1C: No results for input(s): "HGBA1C" in the last 72 hours. Urine analysis:    Component Value Date/Time   COLORURINE YELLOW 09/22/2021 1506   APPEARANCEUR HAZY (A) 09/22/2021 1506   APPEARANCEUR Cloudy (A) 07/21/2014 1326   LABSPEC 1.008 09/22/2021 1506   PHURINE 6.0 09/22/2021 1506   GLUCOSEU NEGATIVE 09/22/2021 1506   HGBUR NEGATIVE 09/22/2021 1506   BILIRUBINUR NEGATIVE 09/22/2021 1506   BILIRUBINUR Negative 07/21/2014 1326   KETONESUR NEGATIVE 09/22/2021 1506   PROTEINUR NEGATIVE 09/22/2021 1506   UROBILINOGEN 0.2 06/30/2014 0910    NITRITE NEGATIVE 09/22/2021 1506   LEUKOCYTESUR TRACE (A) 09/22/2021 1506   Sepsis Labs: '@LABRCNTIP'$ (procalcitonin:4,lacticidven:4) ) Recent Results (from the past 240 hour(s))  Resp panel by RT-PCR (RSV, Flu A&B, Covid) Anterior Nasal Swab     Status: None   Collection Time: 02/13/22  9:15 AM   Specimen: Anterior Nasal Swab  Result Value Ref Range Status   SARS Coronavirus 2 by RT PCR NEGATIVE NEGATIVE Final    Comment: (NOTE) SARS-CoV-2 target nucleic acids are NOT DETECTED.  The SARS-CoV-2 RNA is generally detectable in upper respiratory specimens during the acute phase of infection. The lowest concentration of SARS-CoV-2 viral copies this assay can detect is 138 copies/mL. A negative result does not preclude SARS-Cov-2 infection and should not be used as the sole basis for treatment or other patient management decisions. A negative result may occur with  improper specimen collection/handling, submission of specimen other than nasopharyngeal swab, presence of viral mutation(s) within the areas targeted by this assay, and inadequate number of  viral copies(<138 copies/mL). A negative result must be combined with clinical observations, patient history, and epidemiological information. The expected result is Negative.  Fact Sheet for Patients:  EntrepreneurPulse.com.au  Fact Sheet for Healthcare Providers:  IncredibleEmployment.be  This test is no t yet approved or cleared by the [place] FDA and  has been authorized for detection and/or diagnosis of SARS-CoV-2 by FDA under an Emergency Use Authorization (EUA). This EUA will remain  in effect (meaning this test can be used) for the duration of the COVID-19 declaration under Section 564(b)(1) of the Act, 21 U.S.C.section 360bbb-3(b)(1), unless the authorization is terminated  or revoked sooner.       Influenza A by PCR NEGATIVE NEGATIVE Final   Influenza B by PCR NEGATIVE NEGATIVE  Final    Comment: (NOTE) The Xpert Xpress SARS-CoV-2/FLU/RSV plus assay is intended as an aid in the diagnosis of influenza from Nasopharyngeal swab specimens and should not be used as a sole basis for treatment. Nasal washings and aspirates are unacceptable for Xpert Xpress SARS-CoV-2/FLU/RSV testing.  Fact Sheet for Patients: EntrepreneurPulse.com.au  Fact Sheet for Healthcare Providers: IncredibleEmployment.be  This test is not yet approved or cleared by the [place] FDA and has been authorized for detection and/or diagnosis of SARS-CoV-2 by FDA under an Emergency Use Authorization (EUA). This EUA will remain in effect (meaning this test can be used) for the duration of the COVID-19 declaration under Section 564(b)(1) of the Act, 21 U.S.C. section 360bbb-3(b)(1), unless the authorization is terminated or revoked.     Resp Syncytial Virus by PCR NEGATIVE NEGATIVE Final    Comment: (NOTE) Fact Sheet for Patients: EntrepreneurPulse.com.au  Fact Sheet for Healthcare Providers: IncredibleEmployment.be  This test is not yet approved or cleared by the [place] FDA and has been authorized for detection and/or diagnosis of SARS-CoV-2 by FDA under an Emergency Use Authorization (EUA). This EUA will remain in effect (meaning this test can be used) for the duration of the COVID-19 declaration under Section 564(b)(1) of the Act, 21 U.S.C. section 360bbb-3(b)(1), unless the authorization is terminated or revoked.  Performed at [facility], [address] [place], [place] [address]   Culture, blood (Routine X 2) w Reflex to ID Panel     Status: None (Preliminary result)   Collection Time: 02/13/22 12:58 PM   Specimen: BLOOD RIGHT ARM  Result Value Ref Range Status   Specimen Description BLOOD RIGHT ARM  Final   Special Requests   Final    BOTTLES DRAWN AEROBIC AND ANAEROBIC Blood Culture adequate  volume   Culture   Final    NO GROWTH 2 DAYS Performed at [facility], [address], [place], [place] [address]    Report Status PENDING  Incomplete  Culture, blood (Routine X 2) w Reflex to ID Panel     Status: None (Preliminary result)   Collection Time: 02/13/22  1:03 PM   Specimen: BLOOD  Result Value Ref Range Status   Specimen Description BLOOD BLOOD LEFT ARM  Final   Special Requests NONE  Final   Culture   Final    NO GROWTH 2 DAYS Performed at [facility], [address], [place], [place] [address]    Report Status PENDING  Incomplete  Respiratory (~20 pathogens) panel by PCR     Status: Abnormal   Collection Time: 02/14/22  5:02 PM   Specimen: Nasopharyngeal Swab; Respiratory  Result Value Ref Range Status   Adenovirus NOT DETECTED NOT DETECTED Final   Coronavirus 229E  NOT DETECTED NOT DETECTED Final    Comment: (NOTE) The Coronavirus on the Respiratory Panel, DOES NOT test for the novel  Coronavirus (2019 nCoV)    Coronavirus HKU1 NOT DETECTED NOT DETECTED Final   Coronavirus NL63 NOT DETECTED NOT DETECTED Final   Coronavirus OC43 DETECTED (A) NOT DETECTED Final   Metapneumovirus NOT DETECTED NOT DETECTED Final   Rhinovirus / Enterovirus NOT DETECTED NOT DETECTED Final   Influenza A NOT DETECTED NOT DETECTED Final   Influenza B NOT DETECTED NOT DETECTED Final   Parainfluenza Virus 1 NOT DETECTED NOT DETECTED Final   Parainfluenza Virus 2 NOT DETECTED NOT DETECTED Final   Parainfluenza Virus 3 NOT DETECTED NOT DETECTED Final   Parainfluenza Virus 4 NOT DETECTED NOT DETECTED Final   Respiratory Syncytial Virus NOT DETECTED NOT DETECTED Final   Bordetella pertussis NOT DETECTED NOT DETECTED Final   Bordetella Parapertussis NOT DETECTED NOT DETECTED Final   Chlamydophila pneumoniae NOT DETECTED NOT DETECTED Final   Mycoplasma pneumoniae NOT DETECTED NOT DETECTED Final    Comment: Performed at [facility], [place] [address] [place], [place] [address]      Scheduled Meds:  atorvastatin  20 mg Oral Daily   empagliflozin  10 mg Oral QAC breakfast   furosemide  40 mg Oral BID   losartan  12.5 mg Oral Daily   magnesium oxide  400 mg Oral Daily   metoprolol succinate  25 mg Oral Daily   midodrine  2.5 mg Oral BID WC   sodium chloride flush  3 mL Intravenous Q12H   spironolactone  12.5 mg Oral Daily   [START ON 02/18/2022] warfarin  10 mg Oral Q Mon   And   warfarin  5 mg Oral Once per day on Sun Tue Wed Thu Fri Sat   Warfarin - Pharmacist Dosing Inpatient   Does not apply q1600   Continuous Infusions:  sodium chloride      Procedures/Studies: DG Chest 2 View  Result Date: 02/13/2022 CLINICAL DATA:  Cough with shortness of breath EXAM: CHEST - 2 VIEW COMPARISON:  12/16/2021 FINDINGS: Mild bilateral interstitial thickening. No focal consolidation. No pleural effusion or pneumothorax. Prominence of the central pulmonary vasculature. Stable cardiomegaly. No acute osseous abnormality. IMPRESSION: 1. Cardiomegaly with mild pulmonary vascular congestion. Electronically Signed   By: Kathreen Devoid M.D.   On: 02/13/2022 10:19   MM DIAG BREAST TOMO BILATERAL  Result Date: 02/07/2022 CLINICAL DATA:  Intermittent clear nipple discharge on the right with ongoing symptoms of mastitis for over a year. She had her last round of antibiotics completed on 01/18/2022. She reports severe swelling and enlargement of the right breast prior to the antibiotics which significantly improved with the antibiotics and has not recurred. She has had a right breast mass and right axillary lymph node biopsy as well as an abscess aspirated on 02/01/2020. The biopsies were negative for malignancy. The aspiration demonstrated moderate white blood cells, moderate gram positive cocci, few gram-negative rods, few gram positive rods and few Actinomyces species. She also had a right breast abscess aspirated on 01/01/2022. She also reports a previous skin punch biopsy. EXAM: DIGITAL  DIAGNOSTIC BILATERAL MAMMOGRAM WITH TOMOSYNTHESIS; ULTRASOUND RIGHT BREAST LIMITED TECHNIQUE: Bilateral digital diagnostic mammography and breast tomosynthesis was performed.; Targeted ultrasound examination of the right breast was performed COMPARISON:  Previous exam(s). ACR Breast Density Category b: There are scattered areas of fibroglandular density. FINDINGS: Severe diffuse right breast edema. No mammographically visible fluid collection or mass. The left breast  is unremarkable. On physical exam, the patient has diffuse edema and skin thickening in the right breast with mild dark red coloration in the anterior portions of the breast. No palpable mass. Targeted ultrasound is performed, showing diffuse right breast edema with difficulty penetrating the breast. No visible masses or fluid collections. IMPRESSION: 1. Severe diffuse right breast edema with no mammographically or sonographically visible mass or fluid collection. 2. No evidence of malignancy in the left breast. RECOMMENDATION: 1. Clinical management of the patient's ongoing right breast infection. 2. Bilateral screening mammogram in 1 year. I have discussed the findings and recommendations with the patient. If applicable, a reminder letter will be sent to the patient regarding the next appointment. BI-RADS CATEGORY  2: Benign. Electronically Signed   By: Claudie Revering M.D.   On: 02/07/2022 10:23  US BREAST LTD UNI RIGHT INC AXILLA  Result Date: 02/07/2022 CLINICAL DATA:  Intermittent clear nipple discharge on the right with ongoing symptoms of mastitis for over a year. She had her last round of antibiotics completed on 01/18/2022. She reports severe swelling and enlargement of the right breast prior to the antibiotics which significantly improved with the antibiotics and has not recurred. She has had a right breast mass and right axillary lymph node biopsy as well as an abscess aspirated on 02/01/2020. The biopsies were negative for malignancy. The  aspiration demonstrated moderate white blood cells, moderate gram positive cocci, few gram-negative rods, few gram positive rods and few Actinomyces species. She also had a right breast abscess aspirated on 01/01/2022. She also reports a previous skin punch biopsy. EXAM: DIGITAL DIAGNOSTIC BILATERAL MAMMOGRAM WITH TOMOSYNTHESIS; ULTRASOUND RIGHT BREAST LIMITED TECHNIQUE: Bilateral digital diagnostic mammography and breast tomosynthesis was performed.; Targeted ultrasound examination of the right breast was performed COMPARISON:  Previous exam(s). ACR Breast Density Category b: There are scattered areas of fibroglandular density. FINDINGS: Severe diffuse right breast edema. No mammographically visible fluid collection or mass. The left breast is unremarkable. On physical exam, the patient has diffuse edema and skin thickening in the right breast with mild dark red coloration in the anterior portions of the breast. No palpable mass. Targeted ultrasound is performed, showing diffuse right breast edema with difficulty penetrating the breast. No visible masses or fluid collections. IMPRESSION: 1. Severe diffuse right breast edema with no mammographically or sonographically visible mass or fluid collection. 2. No evidence of malignancy in the left breast. RECOMMENDATION: 1. Clinical management of the patient's ongoing right breast infection. 2. Bilateral screening mammogram in 1 year. I have discussed the findings and recommendations with the patient. If applicable, a reminder letter will be sent to the patient regarding the next appointment. BI-RADS CATEGORY  2: Benign. Electronically Signed   By: Claudie Revering M.D.   On: 02/07/2022 10:23   Orson Eva, DO  [facility]  If 7PM-7AM, please contact night-coverage [email or web address] Password [facility] 02/15/2022, 6:54 PM   LOS: 2 days

## 2022-02-15 NOTE — Progress Notes (Signed)
Patient not ready for CPAP just yet, states she will place on herself shortly and call if she needs any help.

## 2022-02-15 NOTE — Progress Notes (Signed)
[place] for warfarin Indication: history of PE  Allergies  Allergen Reactions   Sulfa Antibiotics Itching, Swelling and Rash    Lip swelling    Patient Measurements: Height: '5\' 11"'$  (180.3 cm) Weight: 126.6 kg (279 lb 1.6 oz) IBW/kg (Calculated) : 70.8 Heparin Dosing Weight:   Vital Signs: Temp: 98.4 F (36.9 C) (01/26 0422) Temp Source: Oral (01/25 2055) BP: 106/73 (01/26 0422) Pulse Rate: 62 (01/26 0422)  Labs: Recent Labs    02/13/22 0915 02/13/22 0933 02/14/22 0422 02/15/22 0417  HGB 9.6*  --   --   --   HCT 33.1*  --   --   --   PLT 195  --   --   --   APTT  --  32  --   --   LABPROT  --  23.7* 21.9* 23.9*  INR  --  2.1* 1.9* 2.2*  CREATININE 1.02*  --  1.09* 1.08*     Estimated Creatinine Clearance: 96.7 mL/min (A) (by C-G formula based on SCr of 1.08 mg/dL (H)).   Medical History: Past Medical History:  Diagnosis Date   Anemia    Blood transfusion without reported diagnosis    CAD (coronary artery disease)    Nonobstructive   Closed left ankle fracture    August 30 2012   Clotting disorder (HCC)    DVT (deep venous thrombosis) (HCC)    L leg   Fibroid tumor    HFrEF (heart failure with reduced ejection fraction) (HCC)    Hidradenitis    Hypertension    Mitral regurgitation    NICM (nonischemic cardiomyopathy) (HCC)    NSVT (nonsustained ventricular tachycardia) (HCC)    Obesity    OSA on CPAP    Pulmonary embolism ([place]) 04/2013   Sleep apnea     Assessment: Pharmacy consulted to dose warfarin in patient with history of PE.  Home does listed as 10 mg every Mon and 5 mg all other days. Last dose listed as 1/23.   INR now up to 2.2 this morning after giving boosted dose yesterday. No cbc done, no bleeding issues noted.   Goal of Therapy:  INR 2-3 Monitor platelets by anticoagulation protocol: Yes   Plan:  Will transition back to home dose today ('5mg'$  tonight) Monitor daily INR and s/s of  bleeding.  Erin Hearing PharmD., BCPS Clinical Pharmacist 02/15/2022 8:40 AM

## 2022-02-16 ENCOUNTER — Inpatient Hospital Stay (HOSPITAL_COMMUNITY): Payer: Medicare HMO

## 2022-02-16 DIAGNOSIS — I340 Nonrheumatic mitral (valve) insufficiency: Secondary | ICD-10-CM | POA: Diagnosis not present

## 2022-02-16 DIAGNOSIS — Z7901 Long term (current) use of anticoagulants: Secondary | ICD-10-CM | POA: Diagnosis not present

## 2022-02-16 DIAGNOSIS — I5023 Acute on chronic systolic (congestive) heart failure: Secondary | ICD-10-CM | POA: Diagnosis not present

## 2022-02-16 LAB — ECHOCARDIOGRAM COMPLETE
Area-P 1/2: 3.5 cm2
Calc EF: 40.4 %
Height: 71 in
MV M vel: 3.99 m/s
MV Peak grad: 63.7 mmHg
MV VTI: 2.82 cm2
Radius: 0.7 cm
S' Lateral: 5.8 cm
Single Plane A2C EF: 34.3 %
Single Plane A4C EF: 44.4 %
Weight: 4511.49 oz

## 2022-02-16 LAB — BASIC METABOLIC PANEL
Anion gap: 8 (ref 5–15)
BUN: 21 mg/dL — ABNORMAL HIGH (ref 6–20)
CO2: 29 mmol/L (ref 22–32)
Calcium: 8.3 mg/dL — ABNORMAL LOW (ref 8.9–10.3)
Chloride: 97 mmol/L — ABNORMAL LOW (ref 98–111)
Creatinine, Ser: 1.11 mg/dL — ABNORMAL HIGH (ref 0.44–1.00)
GFR, Estimated: >60 mL/min (ref >60)
Glucose, Bld: 78 mg/dL (ref 70–99)
Potassium: 3.8 mmol/L (ref 3.5–5.1)
Sodium: 134 mmol/L — ABNORMAL LOW (ref 135–145)

## 2022-02-16 LAB — PROTIME-INR
INR: 2 — ABNORMAL HIGH (ref 0.8–1.2)
Prothrombin Time: 22.6 seconds — ABNORMAL HIGH (ref 11.4–15.2)

## 2022-02-16 LAB — MAGNESIUM: Magnesium: 2.1 mg/dL (ref 1.7–2.4)

## 2022-02-16 MED ORDER — HYDROCODONE BIT-HOMATROP MBR 5-1.5 MG/5ML PO SOLN: 5.0000 mL | ORAL | 0 refills | Status: DC | PRN

## 2022-02-16 MED ORDER — PREDNISONE 20 MG PO TABS
50.0000 mg | ORAL_TABLET | Freq: Every day | ORAL | Status: DC
  Administered 2022-02-16: 50 mg via ORAL
  Filled 2022-02-16: qty 1

## 2022-02-16 MED ORDER — WARFARIN SODIUM 5 MG PO TABS: 10.0000 mg | ORAL_TABLET | ORAL | Status: DC

## 2022-02-16 MED ORDER — WARFARIN SODIUM 5 MG PO TABS: 5.0000 mg | ORAL_TABLET | ORAL | Status: DC

## 2022-02-16 MED ORDER — WARFARIN SODIUM 7.5 MG PO TABS: 7.5000 mg | ORAL_TABLET | Freq: Once | ORAL | Status: DC

## 2022-02-16 MED ORDER — PREDNISONE 50 MG PO TABS: 50.0000 mg | ORAL_TABLET | Freq: Every day | ORAL | 0 refills | Status: DC

## 2022-02-16 NOTE — Progress Notes (Signed)
   02/16/22 1100  ReDS Vest / Clip  Station Marker D  Ruler Value 41  ReDS Value Range < 36  ReDS Actual Value 30  Anatomical Comments  (pt sitting upright)

## 2022-02-16 NOTE — Discharge Summary (Signed)
Physician Discharge Summary   Patient: [name] [name] MRN: [id number] DOB: 12/30/1975  Admit date:     02/13/2022  Discharge date: 02/16/22  Discharge Physician: Shanon Brow Jasenia Weilbacher   PCP: Carrolyn Meiers, MD   Recommendations at discharge:   Please follow up with primary care provider within 1-2 weeks  Please repeat BMP and CBC in one week     Hospital Course: 47 y.o. female, known history of DVT PE 2015 IVC filter prior on Xarelto --- recurrence DVT/PE January 2023 status post thrombectomy on lifelong anticoagulation with Coumadin secondary to weight >250 pounds, OSA on CPAP, Known uterine fibroids, Smoker With recent admission to [facility] for decompensated HF 12/8-12/22/23 presenting with shortness of breath that has worsened since last Thursday, 02/07/2022.  She states that on the evening of 02/13/22, she developed worsening cough, shortness of breath, fever up to 101.8 F.  She had an episode of nausea and vomiting.  She also had 3 loose bowel movements.  There was no hemoptysis.  There is no hematochezia or melena.  There Is no hematemesis.  The patient does endorse a degree of indiscretion with fluid intake.  However, she states that she has been compliant with her furosemide 40 mg twice daily at home.  She has had some intermittent right-sided chest pain in the past week.  She denies any headache, neck pain, dysuria, hematuria.  At the time of discharge, she was 156 kg.  She states that her most recent weight was 141 kg at home. In the ED, the patient was afebrile and hemodynamically stable with oxygen saturation 99% on room air.  WBC 3.8, hemoglobin 9.6, platelets 195,000.  Sodium 132, potassium 4.0, bicarbonate 22, serum creatinine 1.02.  INR was 2.1.  BNP 1996.  Chest x-ray showed pulmonary vascular congestion.  The patient was given furosemide 80 mg IV x 1 and admitted for further evaluation and treatment.  She was continued on IV lasix with clinical improvement.  Cardiology was consulted.   Repeat echo was obtain and did not show any significant changes from 09/2021 Echo, although EF was slightly better at 35-40%.  She was discharged home with her prior lasix and GDMT regimen to follow up at the advanced HF clinic.  Assessment and Plan: Acute on chronic Heart failure with reduced EF  -Nonischemic, LHC in 2023 with nonobstructive CAD.  -09/24/2021 echo EF 30-35%, mildly reduced RVF moderate to severe MR -02/16/22 Echo--EF 35-40%, low normal RVF, severe MR, mild-mod TR -Continue IV furosemide>>lasix 40 mg po bid -Daily weights NEG 8 lbs -Accurate I's and O's -Continue spironolactone, metoprolol succinate, losartan, Jardiance -she does endorse some indiscretion with fluid intake -02/16/21 ReDS vest = 30   Acute bronchitis -COVID and flu negative -Check viral panel--POS for Coronavirus OC43 -Continue Hycodan for cough -Check PCT <0.10 -Chest x-ray without consolidation--personally reviewed -ESR 7   DVT/PE (2015/2023) Chronic anticoagulation. -Submassive saddle PE on 04/27/2013  - Evaluated at [facility] where thrombophilia work-up was negative (Lupus inhibitor, anticardiolipin antibodies, and anti beta-2 glycoprotein antibodies were negative; protein C activity 85%, protein S activity 76%, factor V Leiden, and prothrombin gene mutation ) - IVC filter in place; could not be removed due to significant thrombus burden on filter at [facility].  - Previously advised against DOACs due to severity of thrombus burden and weight >250lbs. -continue warfarin   obesity/OSA/pulmonary hypertension - continue CPAP nightly    Morbid Obesity -BMI 48 -lifestyle modification   Fever -Obtain blood cultures--neg to date -COVID, flu, RSV neg -Due  to viral bronchitis--resp panel POS for Coronavirus OC43 -Check PCT <0.10 -resolved.  No fever x 72 hours prior to dc   Morbid Obesity -BMI 43.48 -lifestyle modification        Pain control - [place] Controlled Substance Reporting System database  was reviewed. and patient was instructed, not to drive, operate heavy machinery, perform activities at heights, swimming or participation in water activities or provide baby-sitting services while on Pain, Sleep and Anxiety Medications; until their outpatient Physician has advised to do so again. Also recommended to not to take more than prescribed Pain, Sleep and Anxiety Medications.  Consultants: cardiology Procedures performed: none  Disposition: Home Diet recommendation:  Cardiac diet DISCHARGE MEDICATION: Allergies as of 02/16/2022       Reactions   Sulfa Antibiotics Itching, Swelling, Rash   Lip swelling        Medication List     STOP taking these medications    oxyCODONE 5 MG immediate release tablet Commonly known as: Oxy IR/ROXICODONE       TAKE these medications    albuterol 108 (90 Base) MCG/ACT inhaler Commonly known as: VENTOLIN HFA Inhale 2 puffs into the lungs every 2 (two) hours as needed for wheezing or shortness of breath.   atorvastatin 20 MG tablet Commonly known as: LIPITOR Take 1 tablet (20 mg total) by mouth daily.   empagliflozin 10 MG Tabs tablet Commonly known as: Jardiance Take 1 tablet (10 mg total) by mouth daily before breakfast.   furosemide 40 MG tablet Commonly known as: LASIX Take 40 mg by mouth 2 (two) times daily.   HYDROcodone bit-homatropine 5-1.5 MG/5ML syrup Commonly known as: HYCODAN Take 5 mLs by mouth every 4 (four) hours as needed for cough.   losartan 25 MG tablet Commonly known as: COZAAR Take 0.5 tablets (12.5 mg total) by mouth daily.   Magnesium Oxide 400 MG Caps Take 1 capsule (400 mg total) by mouth daily.   metoprolol succinate 25 MG 24 hr tablet Commonly known as: TOPROL-XL Take 1 tablet (25 mg total) by mouth daily. Take with or immediately following a meal.   midodrine 2.5 MG tablet Commonly known as: PROAMATINE Take 1 tablet (2.5 mg total) by mouth 2 (two) times daily with a meal.   Potassium  Chloride ER 20 MEQ Tbcr Take 20 mEq by mouth 2 (two) times daily.   predniSONE 50 MG tablet Commonly known as: DELTASONE Take 1 tablet (50 mg total) by mouth daily with breakfast.   spironolactone 25 MG tablet Commonly known as: ALDACTONE Take 0.5 tablets (12.5 mg total) by mouth daily.   warfarin 10 MG tablet Commonly known as: COUMADIN Take as directed. If you are unsure how to take this medication, talk to your nurse or doctor. Original instructions: Take 5-10 mg by mouth See admin instructions. Take as directed in the evening : Monday take 10 mg and all other days take 5 mg.   warfarin 5 MG tablet Commonly known as: COUMADIN Take as directed. If you are unsure how to take this medication, talk to your nurse or doctor. Original instructions: TAKE (1) OR (1) 1/2 TABLET BY MOUTH DAILY AS DIRECTED.        Discharge Exam: Filed Weights   02/14/22 0500 02/15/22 0500 02/16/22 0500  Weight: 127.9 kg 126.6 kg 127.9 kg   HEENT:  [place]/AT, No thrush, no icterus CV:  RRR, no rub, no S3, no S4 Lung:  scattered basilar rales and wheeze Abd:  soft/+BS, NT Ext:  trace nonpitting edema, no lymphangitis, no synovitis, no rash   Condition at discharge: stable  The results of significant diagnostics from this hospitalization (including imaging, microbiology, ancillary and laboratory) are listed below for reference.   Imaging Studies: ECHOCARDIOGRAM COMPLETE  Result Date: 02/16/2022    ECHOCARDIOGRAM REPORT   Patient Name:   [name] [name] Date of Exam: 02/16/2022 Medical Rec #:  [id number]       Height:       71.0 in Accession #:    [id number]      Weight:       282.0 lb Date of Birth:  1975/05/29       BSA:          2.440 m Patient Age:    13 years        BP:           107/75 mmHg Patient Gender: F               HR:           62 bpm. Exam Location:  Inpatient Procedure: 2D Echo, Cardiac Doppler and Color Doppler Indications:    Mitral valve insufficiency I34.0  History:        Patient has  prior history of Echocardiogram examinations, most                 recent 08/23/2021. CHF, CAD, Mitral Valve Disease,                 Arrythmias:nonsustained ventricular tachycardia; Risk                 Factors:Sleep Apnea and Hypertension.  Sonographer:    Darlina Sicilian RDCS Referring Phys: [place]  1. Inferolateral akinesis with overall moderate LV dysfunction; severe, posteriorly directed, ischemic MR.  2. Left ventricular ejection fraction, by estimation, is 35 to 40%. The left ventricle has moderately decreased function. The left ventricle demonstrates regional wall motion abnormalities (see scoring diagram/findings for description). The left ventricular internal cavity size was severely dilated. Left ventricular diastolic parameters are indeterminate. Elevated left atrial pressure.  3. Right ventricular systolic function is low normal. The right ventricular size is mildly enlarged. There is mildly elevated pulmonary artery systolic pressure.  4. Left atrial size was moderately dilated.  5. Right atrial size was mildly dilated.  6. The mitral valve is degenerative. Severe mitral valve regurgitation. No evidence of mitral stenosis.  7. Tricuspid valve regurgitation is mild to moderate.  8. The aortic valve is tricuspid. Aortic valve regurgitation is not visualized. No aortic stenosis is present.  9. The inferior vena cava is dilated in size with <50% respiratory variability, suggesting right atrial pressure of 15 mmHg. FINDINGS  Left Ventricle: Left ventricular ejection fraction, by estimation, is 35 to 40%. The left ventricle has moderately decreased function. The left ventricle demonstrates regional wall motion abnormalities. The left ventricular internal cavity size was severely dilated. There is no left ventricular hypertrophy. Left ventricular diastolic parameters are indeterminate. Elevated left atrial pressure. Right Ventricle: The right ventricular size is mildly enlarged.  Right ventricular systolic function is low normal. There is mildly elevated pulmonary artery systolic pressure. The tricuspid regurgitant velocity is 2.53 m/s, and with an assumed right atrial pressure of 15 mmHg, the estimated right ventricular systolic pressure is 76.2 mmHg. Left Atrium: Left atrial size was moderately dilated. Right Atrium: Right atrial size was mildly dilated. Pericardium: Trivial pericardial effusion is present. The pericardial effusion is localized  near the right atrium and anterior to the right ventricle. Mitral Valve: The mitral valve is degenerative in appearance. Severe mitral valve regurgitation. No evidence of mitral valve stenosis. MV peak gradient, 1.8 mmHg. The mean mitral valve gradient is 0.0 mmHg. Tricuspid Valve: The tricuspid valve is grossly normal. Tricuspid valve regurgitation is mild to moderate. No evidence of tricuspid stenosis. Aortic Valve: The aortic valve is tricuspid. Aortic valve regurgitation is not visualized. No aortic stenosis is present. Pulmonic Valve: The pulmonic valve was grossly normal. Pulmonic valve regurgitation is trivial. No evidence of pulmonic stenosis. Aorta: The aortic root and ascending aorta are structurally normal, with no evidence of dilitation. Venous: A pattern of systolic flow reversal, suggestive of severe mitral regurgitation is recorded from the left upper pulmonary vein and the left lower pulmonary vein. The inferior vena cava is dilated in size with less than 50% respiratory variability,  suggesting right atrial pressure of 15 mmHg. IAS/Shunts: No atrial level shunt detected by color flow Doppler. Additional Comments: Inferolateral akinesis with overall moderate LV dysfunction; severe, posteriorly directed, ischemic MR.  LEFT VENTRICLE PLAX 2D LVIDd:         7.20 cm      Diastology LVIDs:         5.80 cm      LV e' medial:    4.68 cm/s LV PW:         0.90 cm      LV E/e' medial:  20.3 LV IVS:        1.10 cm      LV e' lateral:   5.77  cm/s LVOT diam:     2.00 cm      LV E/e' lateral: 16.4 LV SV:         51 LV SV Index:   21 LVOT Area:     3.14 cm  LV Volumes (MOD) LV vol d, MOD A2C: 251.0 ml LV vol d, MOD A4C: 232.0 ml LV vol s, MOD A2C: 165.0 ml LV vol s, MOD A4C: 129.0 ml LV SV MOD A2C:     86.0 ml LV SV MOD A4C:     232.0 ml LV SV MOD BP:      98.3 ml RIGHT VENTRICLE RV Basal diam:  5.40 cm RV Mid diam:    4.10 cm RV S prime:     11.70 cm/s TAPSE (M-mode): 1.6 cm LEFT ATRIUM             Index        RIGHT ATRIUM           Index LA diam:        4.80 cm 1.97 cm/m   RA Area:     24.20 cm LA Vol (A2C):   90.4 ml 37.05 ml/m  RA Volume:   84.80 ml  34.75 ml/m LA Vol (A4C):   91.5 ml 37.50 ml/m LA Biplane Vol: 99.8 ml 40.90 ml/m  AORTIC VALVE LVOT Vmax:   90.70 cm/s LVOT Vmean:  56.800 cm/s LVOT VTI:    0.161 m  AORTA Ao Root diam: 2.80 cm Ao Asc diam:  2.50 cm MITRAL VALVE                  TRICUSPID VALVE MV Area (PHT): 3.50 cm       TR Peak grad:   25.6 mmHg MV Area VTI:   2.82 cm       TR Vmax:        253.00  cm/s MV Peak grad:  1.8 mmHg MV Mean grad:  0.0 mmHg       SHUNTS MV Vmax:       0.67 m/s       Systemic VTI:  0.16 m MV Vmean:      30.6 cm/s      Systemic Diam: 2.00 cm MV Decel Time: 217 msec MR Peak grad:    63.7 mmHg MR Mean grad:    43.0 mmHg MR Vmax:         399.00 cm/s MR Vmean:        313.0 cm/s MR PISA:         3.08 cm MR PISA Eff ROA: 26 mm MR PISA Radius:  0.70 cm MV E velocity: 94.90 cm/s Kirk Ruths MD Electronically signed by Kirk Ruths MD Signature Date/Time: 02/16/2022/10:12:55 AM    Final    DG Chest 2 View  Result Date: 02/13/2022 CLINICAL DATA:  Cough with shortness of breath EXAM: CHEST - 2 VIEW COMPARISON:  12/16/2021 FINDINGS: Mild bilateral interstitial thickening. No focal consolidation. No pleural effusion or pneumothorax. Prominence of the central pulmonary vasculature. Stable cardiomegaly. No acute osseous abnormality. IMPRESSION: 1. Cardiomegaly with mild pulmonary vascular congestion.  Electronically Signed   By: Kathreen Devoid M.D.   On: 02/13/2022 10:19   MM DIAG BREAST TOMO BILATERAL  Result Date: 02/07/2022 CLINICAL DATA:  Intermittent clear nipple discharge on the right with ongoing symptoms of mastitis for over a year. She had her last round of antibiotics completed on 01/18/2022. She reports severe swelling and enlargement of the right breast prior to the antibiotics which significantly improved with the antibiotics and has not recurred. She has had a right breast mass and right axillary lymph node biopsy as well as an abscess aspirated on 02/01/2020. The biopsies were negative for malignancy. The aspiration demonstrated moderate white blood cells, moderate gram positive cocci, few gram-negative rods, few gram positive rods and few Actinomyces species. She also had a right breast abscess aspirated on 01/01/2022. She also reports a previous skin punch biopsy. EXAM: DIGITAL DIAGNOSTIC BILATERAL MAMMOGRAM WITH TOMOSYNTHESIS; ULTRASOUND RIGHT BREAST LIMITED TECHNIQUE: Bilateral digital diagnostic mammography and breast tomosynthesis was performed.; Targeted ultrasound examination of the right breast was performed COMPARISON:  Previous exam(s). ACR Breast Density Category b: There are scattered areas of fibroglandular density. FINDINGS: Severe diffuse right breast edema. No mammographically visible fluid collection or mass. The left breast is unremarkable. On physical exam, the patient has diffuse edema and skin thickening in the right breast with mild dark red coloration in the anterior portions of the breast. No palpable mass. Targeted ultrasound is performed, showing diffuse right breast edema with difficulty penetrating the breast. No visible masses or fluid collections. IMPRESSION: 1. Severe diffuse right breast edema with no mammographically or sonographically visible mass or fluid collection. 2. No evidence of malignancy in the left breast. RECOMMENDATION: 1. Clinical management of the  patient's ongoing right breast infection. 2. Bilateral screening mammogram in 1 year. I have discussed the findings and recommendations with the patient. If applicable, a reminder letter will be sent to the patient regarding the next appointment. BI-RADS CATEGORY  2: Benign. Electronically Signed   By: Claudie Revering M.D.   On: 02/07/2022 10:23  US BREAST LTD UNI RIGHT INC AXILLA  Result Date: 02/07/2022 CLINICAL DATA:  Intermittent clear nipple discharge on the right with ongoing symptoms of mastitis for over a year. She had her last round of antibiotics completed on 01/18/2022. She  reports severe swelling and enlargement of the right breast prior to the antibiotics which significantly improved with the antibiotics and has not recurred. She has had a right breast mass and right axillary lymph node biopsy as well as an abscess aspirated on 02/01/2020. The biopsies were negative for malignancy. The aspiration demonstrated moderate white blood cells, moderate gram positive cocci, few gram-negative rods, few gram positive rods and few Actinomyces species. She also had a right breast abscess aspirated on 01/01/2022. She also reports a previous skin punch biopsy. EXAM: DIGITAL DIAGNOSTIC BILATERAL MAMMOGRAM WITH TOMOSYNTHESIS; ULTRASOUND RIGHT BREAST LIMITED TECHNIQUE: Bilateral digital diagnostic mammography and breast tomosynthesis was performed.; Targeted ultrasound examination of the right breast was performed COMPARISON:  Previous exam(s). ACR Breast Density Category b: There are scattered areas of fibroglandular density. FINDINGS: Severe diffuse right breast edema. No mammographically visible fluid collection or mass. The left breast is unremarkable. On physical exam, the patient has diffuse edema and skin thickening in the right breast with mild dark red coloration in the anterior portions of the breast. No palpable mass. Targeted ultrasound is performed, showing diffuse right breast edema with difficulty  penetrating the breast. No visible masses or fluid collections. IMPRESSION: 1. Severe diffuse right breast edema with no mammographically or sonographically visible mass or fluid collection. 2. No evidence of malignancy in the left breast. RECOMMENDATION: 1. Clinical management of the patient's ongoing right breast infection. 2. Bilateral screening mammogram in 1 year. I have discussed the findings and recommendations with the patient. If applicable, a reminder letter will be sent to the patient regarding the next appointment. BI-RADS CATEGORY  2: Benign. Electronically Signed   By: Claudie Revering M.D.   On: 02/07/2022 10:23   Microbiology: Results for orders placed or performed during the hospital encounter of 02/13/22  Resp panel by RT-PCR (RSV, Flu A&B, Covid) Anterior Nasal Swab     Status: None   Collection Time: 02/13/22  9:15 AM   Specimen: Anterior Nasal Swab  Result Value Ref Range Status   SARS Coronavirus 2 by RT PCR NEGATIVE NEGATIVE Final    Comment: (NOTE) SARS-CoV-2 target nucleic acids are NOT DETECTED.  The SARS-CoV-2 RNA is generally detectable in upper respiratory specimens during the acute phase of infection. The lowest concentration of SARS-CoV-2 viral copies this assay can detect is 138 copies/mL. A negative result does not preclude SARS-Cov-2 infection and should not be used as the sole basis for treatment or other patient management decisions. A negative result may occur with  improper specimen collection/handling, submission of specimen other than nasopharyngeal swab, presence of viral mutation(s) within the areas targeted by this assay, and inadequate number of viral copies(<138 copies/mL). A negative result must be combined with clinical observations, patient history, and epidemiological information. The expected result is Negative.  Fact Sheet for Patients:  EntrepreneurPulse.com.au  Fact Sheet for Healthcare Providers:   IncredibleEmployment.be  This test is no t yet approved or cleared by the [place] FDA and  has been authorized for detection and/or diagnosis of SARS-CoV-2 by FDA under an Emergency Use Authorization (EUA). This EUA will remain  in effect (meaning this test can be used) for the duration of the COVID-19 declaration under Section 564(b)(1) of the Act, 21 U.S.C.section 360bbb-3(b)(1), unless the authorization is terminated  or revoked sooner.       Influenza A by PCR NEGATIVE NEGATIVE Final   Influenza B by PCR NEGATIVE NEGATIVE Final    Comment: (NOTE) The Xpert Xpress SARS-CoV-2/FLU/RSV plus assay is  intended as an aid in the diagnosis of influenza from Nasopharyngeal swab specimens and should not be used as a sole basis for treatment. Nasal washings and aspirates are unacceptable for Xpert Xpress SARS-CoV-2/FLU/RSV testing.  Fact Sheet for Patients: EntrepreneurPulse.com.au  Fact Sheet for Healthcare Providers: IncredibleEmployment.be  This test is not yet approved or cleared by the [place] FDA and has been authorized for detection and/or diagnosis of SARS-CoV-2 by FDA under an Emergency Use Authorization (EUA). This EUA will remain in effect (meaning this test can be used) for the duration of the COVID-19 declaration under Section 564(b)(1) of the Act, 21 U.S.C. section 360bbb-3(b)(1), unless the authorization is terminated or revoked.     Resp Syncytial Virus by PCR NEGATIVE NEGATIVE Final    Comment: (NOTE) Fact Sheet for Patients: EntrepreneurPulse.com.au  Fact Sheet for Healthcare Providers: IncredibleEmployment.be  This test is not yet approved or cleared by the [place] FDA and has been authorized for detection and/or diagnosis of SARS-CoV-2 by FDA under an Emergency Use Authorization (EUA). This EUA will remain in effect (meaning this test can be used) for  the duration of the COVID-19 declaration under Section 564(b)(1) of the Act, 21 U.S.C. section 360bbb-3(b)(1), unless the authorization is terminated or revoked.  Performed at [facility], [address] [place], [place] [address]   Culture, blood (Routine X 2) w Reflex to ID Panel     Status: None (Preliminary result)   Collection Time: 02/13/22 12:58 PM   Specimen: BLOOD RIGHT ARM  Result Value Ref Range Status   Specimen Description BLOOD RIGHT ARM  Final   Special Requests   Final    BOTTLES DRAWN AEROBIC AND ANAEROBIC Blood Culture adequate volume   Culture   Final    NO GROWTH 3 DAYS Performed at [facility], [address] [place], [place] [address]    Report Status PENDING  Incomplete  Culture, blood (Routine X 2) w Reflex to ID Panel     Status: None (Preliminary result)   Collection Time: 02/13/22  1:03 PM   Specimen: BLOOD  Result Value Ref Range Status   Specimen Description BLOOD BLOOD LEFT ARM  Final   Special Requests NONE  Final   Culture   Final    NO GROWTH 3 DAYS Performed at [facility], [address], [place], [place] [address]    Report Status PENDING  Incomplete  Respiratory (~20 pathogens) panel by PCR     Status: Abnormal   Collection Time: 02/14/22  5:02 PM   Specimen: Nasopharyngeal Swab; Respiratory  Result Value Ref Range Status   Adenovirus NOT DETECTED NOT DETECTED Final   Coronavirus 229E NOT DETECTED NOT DETECTED Final    Comment: (NOTE) The Coronavirus on the Respiratory Panel, DOES NOT test for the novel  Coronavirus (2019 nCoV)    Coronavirus HKU1 NOT DETECTED NOT DETECTED Final   Coronavirus NL63 NOT DETECTED NOT DETECTED Final   Coronavirus OC43 DETECTED (A) NOT DETECTED Final   Metapneumovirus NOT DETECTED NOT DETECTED Final   Rhinovirus / Enterovirus NOT DETECTED NOT DETECTED Final   Influenza A NOT DETECTED NOT DETECTED Final   Influenza B NOT DETECTED NOT DETECTED Final   Parainfluenza Virus 1 NOT DETECTED NOT DETECTED Final    Parainfluenza Virus 2 NOT DETECTED NOT DETECTED Final   Parainfluenza Virus 3 NOT DETECTED NOT DETECTED Final   Parainfluenza Virus 4 NOT DETECTED NOT DETECTED Final   Respiratory Syncytial Virus NOT DETECTED NOT DETECTED Final   Bordetella  pertussis NOT DETECTED NOT DETECTED Final   Bordetella Parapertussis NOT DETECTED NOT DETECTED Final   Chlamydophila pneumoniae NOT DETECTED NOT DETECTED Final   Mycoplasma pneumoniae NOT DETECTED NOT DETECTED Final    Comment: Performed at [facility], [place] [address] [place], [place] [address]    Labs: CBC: Recent Labs  Lab 02/13/22 0915 02/15/22 1103  WBC 3.8* 3.4*  HGB 9.6* 10.1*  HCT 33.1* 35.4*  MCV 80.1 80.8  PLT 195 109   Basic Metabolic Panel: Recent Labs  Lab 02/13/22 0915 02/14/22 0422 02/15/22 0417 02/16/22 0339  NA 132* 135 134* 134*  K 4.0 3.9 3.6 3.8  CL 100 99 98 97*  CO2 '22 27 28 29  '$ GLUCOSE 96 89 69* 78  BUN '15 15 19 '$ 21*  CREATININE 1.02* 1.09* 1.08* 1.11*  CALCIUM 8.7* 8.6* 8.4* 8.3*  MG  --  2.2 2.1 2.1   Liver Function Tests: Recent Labs  Lab 02/13/22 0915  AST 23  ALT 15  ALKPHOS 86  BILITOT 1.9*  PROT 8.2*  ALBUMIN 3.3*   CBG: No results for input(s): "GLUCAP" in the last 168 hours.  Discharge time spent: greater than 30 minutes.  Signed: Orson Eva, MD [facility] 02/16/2022

## 2022-02-16 NOTE — Progress Notes (Signed)
Patient alert and oriented, rested through the night with CPAP on, no distress noted. Presented with cough earlier, was medication with PRN cough syrup, PRN med was effective and gave relief.

## 2022-02-16 NOTE — Plan of Care (Signed)
  Problem: Education: Goal: Understanding of CV disease, CV risk reduction, and recovery process will improve Outcome: Adequate for Discharge Goal: Individualized Educational Video(s) Outcome: Adequate for Discharge   Problem: Activity: Goal: Ability to return to baseline activity level will improve Outcome: Adequate for Discharge   Problem: Cardiovascular: Goal: Ability to achieve and maintain adequate cardiovascular perfusion will improve Outcome: Adequate for Discharge Goal: Vascular access site(s) Level 0-1 will be maintained Outcome: Adequate for Discharge   Problem: Health Behavior/Discharge Planning: Goal: Ability to safely manage health-related needs after discharge will improve Outcome: Adequate for Discharge   Problem: Education: Goal: Knowledge of General Education information will improve Description: Including pain rating scale, medication(s)/side effects and non-pharmacologic comfort measures 02/16/2022 1347 by Santa Lighter, RN Outcome: Adequate for Discharge 02/16/2022 0834 by Santa Lighter, RN Outcome: Progressing   Problem: Health Behavior/Discharge Planning: Goal: Ability to manage health-related needs will improve Outcome: Adequate for Discharge   Problem: Clinical Measurements: Goal: Ability to maintain clinical measurements within normal limits will improve 02/16/2022 1347 by Santa Lighter, RN Outcome: Adequate for Discharge 02/16/2022 0834 by Santa Lighter, RN Outcome: Progressing Goal: Will remain free from infection Outcome: Adequate for Discharge Goal: Diagnostic test results will improve Outcome: Adequate for Discharge Goal: Respiratory complications will improve Outcome: Adequate for Discharge Goal: Cardiovascular complication will be avoided Outcome: Adequate for Discharge   Problem: Activity: Goal: Risk for activity intolerance will decrease Outcome: Adequate for Discharge   Problem: Nutrition: Goal: Adequate nutrition will be  maintained Outcome: Adequate for Discharge   Problem: Coping: Goal: Level of anxiety will decrease Outcome: Adequate for Discharge   Problem: Elimination: Goal: Will not experience complications related to bowel motility Outcome: Adequate for Discharge Goal: Will not experience complications related to urinary retention Outcome: Adequate for Discharge   Problem: Pain Managment: Goal: General experience of comfort will improve Outcome: Adequate for Discharge   Problem: Safety: Goal: Ability to remain free from injury will improve Outcome: Adequate for Discharge   Problem: Skin Integrity: Goal: Risk for impaired skin integrity will decrease Outcome: Adequate for Discharge

## 2022-02-16 NOTE — Plan of Care (Signed)
  Problem: Education: Goal: Knowledge of General Education information will improve Description: Including pain rating scale, medication(s)/side effects and non-pharmacologic comfort measures Outcome: Progressing   Problem: Clinical Measurements: Goal: Ability to maintain clinical measurements within normal limits will improve Outcome: Progressing   

## 2022-02-16 NOTE — Progress Notes (Signed)
ANTICOAGULATION CONSULT NOTE  Pharmacy Consult for warfarin Indication: history of PE  Allergies  Allergen Reactions   Sulfa Antibiotics Itching, Swelling and Rash    Lip swelling   Patient Measurements: Height: '5\' 11"'$  (180.3 cm) Weight: 127.9 kg (281 lb 15.5 oz) IBW/kg (Calculated) : 70.8 Heparin Dosing Weight:   Vital Signs: Temp: 96.3 F (35.7 C) (01/27 0414) BP: 107/75 (01/27 0414) Pulse Rate: 76 (01/27 0826)  Labs: Recent Labs    02/14/22 0422 02/15/22 0417 02/15/22 1103 02/16/22 0339  HGB  --   --  10.1*  --   HCT  --   --  35.4*  --   PLT  --   --  195  --   LABPROT 21.9* 23.9*  --  22.6*  INR 1.9* 2.2*  --  2.0*  CREATININE 1.09* 1.08*  --  1.11*    Estimated Creatinine Clearance: 94.6 mL/min (A) (by C-G formula based on SCr of 1.11 mg/dL (H)).  Assessment: Pharmacy consulted to dose warfarin in patient with history of PE.  Home does listed as 10 mg every Mon and 5 mg all other days. Last dose listed as 1/23.   Today: INR remains therapeutic albeit lower range. Will boost today and adjust INR to 3 times a week.  Goal of Therapy:  INR 2-3 Monitor platelets by anticoagulation protocol: Yes   Plan:  Warfarin 7.'5mg'$  po x1, then continue home regimen INR MWF  Lorenso Courier, PharmD Clinical Pharmacist 02/16/2022 10:01 AM

## 2022-02-16 NOTE — Progress Notes (Signed)
Removed IV-CDI. Glenard Haring, RN, reviewed d/c paperwork with patient and answered questions. Wheeled stable patient and belongings, including preprinted prescription,  to main entrance where she was picked up by her husband.

## 2022-02-16 NOTE — Progress Notes (Signed)
  Echocardiogram 2D Echocardiogram has been performed.  [name] [name] M 02/16/2022, 9:19 AM

## 2022-02-18 ENCOUNTER — Ambulatory Visit: Payer: Medicare HMO | Admitting: Family Medicine

## 2022-02-18 ENCOUNTER — Telehealth (HOSPITAL_COMMUNITY): Payer: Self-pay | Admitting: *Deleted

## 2022-02-18 LAB — CULTURE, BLOOD (ROUTINE X 2)
Culture: NO GROWTH
Culture: NO GROWTH
Special Requests: ADEQUATE

## 2022-02-18 NOTE — Telephone Encounter (Signed)
Attempted to call patient regarding upcoming cardiac MRI appointment. °Left message on voicemail with name and callback number ° °Keonna Raether RN Navigator Cardiac Imaging °[facility] °[phone] Office °[phone] Cell ° °

## 2022-02-19 ENCOUNTER — Ambulatory Visit (HOSPITAL_COMMUNITY): Admission: RE | Admit: 2022-02-19 | Payer: Medicare HMO | Source: Ambulatory Visit

## 2022-02-19 ENCOUNTER — Other Ambulatory Visit (HOSPITAL_COMMUNITY): Payer: Medicare HMO

## 2022-02-25 ENCOUNTER — Ambulatory Visit (INDEPENDENT_AMBULATORY_CARE_PROVIDER_SITE_OTHER): Payer: Medicare HMO | Admitting: Cardiovascular Disease

## 2022-02-25 ENCOUNTER — Encounter: Payer: Self-pay | Admitting: Cardiovascular Disease

## 2022-02-25 VITALS — BP 94/64 | HR 70 | Ht 71.0 in | Wt 276.0 lb

## 2022-02-25 DIAGNOSIS — I428 Other cardiomyopathies: Secondary | ICD-10-CM | POA: Diagnosis not present

## 2022-02-25 DIAGNOSIS — I5042 Chronic combined systolic (congestive) and diastolic (congestive) heart failure: Secondary | ICD-10-CM | POA: Diagnosis not present

## 2022-02-25 DIAGNOSIS — I5020 Unspecified systolic (congestive) heart failure: Secondary | ICD-10-CM

## 2022-02-25 NOTE — H&P (View-Only) (Signed)
Electrophysiology Office Note:    Date:  02/25/2022   ID:  [name] [name], DOB December 29, 1975, MRN [id number]  PCP:  [name] [name], [name] Cardiologist:  [name] Dolly, MD Electrophysiologist:  [name] Quitter, MD     Referring MD: [name] [name]*    History of Present Illness:    [name] [name] is a 47 y.o. female with a hx relevant for CHFrEF, sleep apnea, unprovoked saddle pulmonary embolism in 2015, unprovoked DVT in 2023 (after missing doses of anticoagulation), presence of IVC filter, pulmonary hypertension, moderate severe MR, nonobstructive CAD referred for arrhythmia management.  She has a complex medical history.  Reviewed the note by the referring provider, [name] [name], from 11/13/2021 that outlines the patient's clinical course. In brief, the patient has a history of depressed EF, recently < 35%. She has not been able to tolerate GDMT in the past due to low Bps. She had an episode of syncope that occurred while she was battling prolonged nausea with dry heaves.  A 30-day monitor was placed that showed nonsustained VT. Otherwise, she has not had any syncope.  Her primary complaint is tenderness and swelling of the right breast that began over the weekend. She was evaluated in the ER and referred for outpatient management with her gynecologist. Otherwise, her exertional capacity and work of breathing is stable.  She is here today to follow-up after her TTE.  Past Medical History:  Diagnosis Date   Anemia    Blood transfusion without reported diagnosis    CAD (coronary artery disease)    Nonobstructive   Closed left ankle fracture    August 30 2012   Clotting disorder [facility])    DVT (deep venous thrombosis) (HCC)    L leg   Fibroid tumor    HFrEF (heart failure with reduced ejection fraction) (HCC)    Hidradenitis    Hypertension    Mitral regurgitation    NICM (nonischemic cardiomyopathy) (HCC)    NSVT (nonsustained  ventricular tachycardia) (HCC)    Obesity    OSA on CPAP    Pulmonary embolism ([place]) 04/2013   Sleep apnea     Past Surgical History:  Procedure Laterality Date   ABSCESS DRAINAGE Bilateral 01/10/2015   BREAST BIOPSY Right 02/01/2020   benign   COLONOSCOPY WITH PROPOFOL N/A 06/08/2018   Procedure: COLONOSCOPY WITH PROPOFOL;  Surgeon: Daneil Dolin, MD;  [facility];  Service: Endoscopy;  Laterality: N/A;  2:30pm   CYST REMOVAL TRUNK     IVC FILTER PLACEMENT ([place] HX)     LOWER EXTREMITY VENOGRAPHY N/A 02/07/2021   Procedure: LOWER EXTREMITY VENOGRAPHY;  Surgeon: [name], MD;  Location: [place] CV LAB;  Service: Cardiovascular;  Laterality: N/A;   PERIPHERAL VASCULAR THROMBECTOMY N/A 02/07/2021   Procedure: PERIPHERAL VASCULAR THROMBECTOMY;  Surgeon: [name], MD;  Location: [place] CV LAB;  Service: Cardiovascular;  Laterality: N/A;   RIGHT/LEFT HEART CATH AND CORONARY ANGIOGRAPHY N/A 06/19/2021   Procedure: RIGHT/LEFT HEART CATH AND CORONARY ANGIOGRAPHY;  Surgeon: Belva Crome, MD;  Location: [place] CV LAB;  Service: Cardiovascular;  Laterality: N/A;    Current Medications: Current Meds  Medication Sig   albuterol (VENTOLIN HFA) 108 (90 Base) MCG/ACT inhaler Inhale 2 puffs into the lungs every 2 (two) hours as needed for wheezing or shortness of breath.   atorvastatin (LIPITOR) 20 MG tablet Take 1 tablet (20 mg total) by mouth daily.  empagliflozin (JARDIANCE) 10 MG TABS tablet Take 1 tablet (10 mg total) by mouth daily before breakfast.   furosemide (LASIX) 40 MG tablet Take 40 mg by mouth 2 (two) times daily.   HYDROcodone bit-homatropine (HYCODAN) 5-1.5 MG/5ML syrup Take 5 mLs by mouth every 4 (four) hours as needed for cough.   losartan (COZAAR) 25 MG tablet Take 0.5 tablets (12.5 mg total) by mouth daily.   Magnesium Oxide 400 MG CAPS Take 1 capsule (400 mg total) by mouth daily.   metoprolol succinate (TOPROL-XL) 25 MG 24 hr  tablet Take 1 tablet (25 mg total) by mouth daily. Take with or immediately following a meal.   midodrine (PROAMATINE) 2.5 MG tablet Take 1 tablet (2.5 mg total) by mouth 2 (two) times daily with a meal.   potassium chloride 20 MEQ TBCR Take 20 mEq by mouth 2 (two) times daily.   spironolactone (ALDACTONE) 25 MG tablet Take 0.5 tablets (12.5 mg total) by mouth daily.   warfarin (COUMADIN) 10 MG tablet Take 5-10 mg by mouth See admin instructions. Take as directed in the evening : Monday take 10 mg and all other days take 5 mg.   warfarin (COUMADIN) 5 MG tablet TAKE (1) OR (1) 1/2 TABLET BY MOUTH DAILY AS DIRECTED.     Allergies:   Sulfa antibiotics   Social History   Socioeconomic History   Marital status: Widowed    Spouse name: Not on file   Number of children: Not on file   Years of education: Not on file   Highest education level: Not on file  Occupational History   Not on file  Tobacco Use   Smoking status: Every Day    Years: 18.00    Types: Cigarettes   Smokeless tobacco: Never   Tobacco comments:    Smokes 2-3 cigarettes daily as of 02/13/22  Vaping Use   Vaping Use: Never used  Substance and Sexual Activity   Alcohol use: Not Currently    Alcohol/week: 2.0 standard drinks of alcohol    Types: 2 Cans of beer per week    Comment: twice a month   Drug use: No   Sexual activity: Not Currently    Birth control/protection: Pill, None  Other Topics Concern   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    Has a 46 year old [place].   Live with parents.   Was working 5 days a weeks.   Not working right now.    Attends church.    Social Determinants of Health   Financial Resource Strain: Not on file  Food Insecurity: No Food Insecurity (02/13/2022)   Hunger Vital Sign    Worried About Running Out of Food in the Last Year: Never true    Ran Out of Food in the Last Year: Never true  Transportation Needs: No  Transportation Needs (02/13/2022)   PRAPARE - Hydrologist (Medical): No    Lack of Transportation (Non-Medical): No  Physical Activity: Not on file  Stress: Not on file  Social Connections: Not on file     Family History: The patient's family history includes Cancer in her maternal grandmother; Diabetes in her paternal aunt and paternal uncle; Heart attack in her paternal grandmother; Hypertension in her mother; Other in her father. There is no history of Colon cancer.  ROS:   Please see the history of present illness.  All other systems reviewed and are negative.  EKGs/Labs/Other Studies Reviewed Today:     30 Day monitor 11/2021   30 day monitor. Data available from 75% of planned monitored time   Min HR 61, Max HR 159, Avg HR 81   Symptoms correlated with sinus rhythm, rare PVCs.   Short runs of SVT at times with aberrancy without symptoms reported. Brief NSVT no reported symptoms Frequent aberrancy  EKG:  Last EKG results: today-  sinus. IRBBB, lateral infarct   Recent Labs: 11/14/2021: TSH 3.122 02/13/2022: ALT 15; B Natriuretic Peptide 1,996.0 02/15/2022: Hemoglobin 10.1; Platelets 195 02/16/2022: BUN 21; Creatinine, Ser 1.11; Magnesium 2.1; Potassium 3.8; Sodium 134     Physical Exam:    VS:  BP 94/64   Pulse 70   Ht 5' 11"$  (1.803 m)   Wt 276 lb (125.2 kg)   LMP 12/27/2021   SpO2 97%   BMI 38.49 kg/m     Wt Readings from Last 3 Encounters:  02/25/22 276 lb (125.2 kg)  02/16/22 281 lb 15.5 oz (127.9 kg)  01/30/22 (!) 310 lb 9.6 oz (140.9 kg)     GEN:  Well nourished, well developed in no acute distress, obese CARDIAC: RRR, no murmurs, rubs, gallops RESPIRATORY:  Normal work of breathing MUSCULOSKELETAL: slight dependent edema Right breast is markedly larger than left.   ASSESSMENT & PLAN:    Syncope: in setting of gastritis, very likely vasovagal.  NSVT: continue routine medical management of CHF, betablocker. In the  absence of significant symptoms of sustained VT, I do not think she would benefit substantially from an EP study. CHFrEF: EF improved to > 35% on metoprolol, losartan, and spironolactone. Since her EF has improved, ICD is not indicated.      Signed, [name] Quitter, MD  02/25/2022 9:46 AM    [place]

## 2022-02-25 NOTE — Patient Instructions (Signed)
Medication Instructions:  Your physician recommends that you continue on your current medications as directed. Please refer to the Current Medication list given to you today.  *If you need a refill on your cardiac medications before your next appointment, please call your pharmacy*  Lab Work: None ordered  Testing/Procedures: None ordered  Follow-Up: Keep all follow-up appointments as scheduled.

## 2022-02-25 NOTE — Progress Notes (Signed)
Electrophysiology Office Note:    Date:  02/25/2022   ID:  [name] [name], DOB 06/22/1975, MRN [id number]  PCP:  Fanta, Tesfaye Demissie, MD   [facility] Prov[facility] Cardiologist:  Branch, Jonathan, MD Electrophysiologist:  Saniyya Gau E Naturi Alarid, MD     Referring MD: Fanta, Tesfaye Demissie*    History of Present Illness:    [name] [name] is a 46 y.o. female with a hx relevant for CHFrEF, sleep apnea, unprovoked saddle pulmonary embolism in 2015, unprovoked DVT in 2023 (after missing doses of anticoagulation), presence of IVC filter, pulmonary hypertension, moderate severe MR, nonobstructive CAD referred for arrhythmia management.  She has a complex medical history.  Reviewed the note by the referring provider, Dayna Dunn, from 11/13/2021 that outlines the patient's clinical course. In brief, the patient has a history of depressed EF, recently < 35%. She has not been able to tolerate GDMT in the past due to low Bps. She had an episode of syncope that occurred while she was battling prolonged nausea with dry heaves.  A 30-day monitor was placed that showed nonsustained VT. Otherwise, she has not had any syncope.  Her primary complaint is tenderness and swelling of the right breast that began over the weekend. She was evaluated in the ER and referred for outpatient management with her gynecologist. Otherwise, her exertional capacity and work of breathing is stable.  She is here today to follow-up after her TTE.  Past Medical History:  Diagnosis Date   Anemia    Blood transfusion without reported diagnosis    CAD (coronary artery disease)    Nonobstructive   Closed left ankle fracture    August 30 2012   Clotting disorder (HCC)    DVT (deep venous thrombosis) (HCC)    L leg   Fibroid tumor    HFrEF (heart failure with reduced ejection fraction) (HCC)    Hidradenitis    Hypertension    Mitral regurgitation    NICM (nonischemic cardiomyopathy) (HCC)    NSVT (nonsustained  ventricular tachycardia) (HCC)    Obesity    OSA on CPAP    Pulmonary embolism (HCC) 04/2013   Sleep apnea     Past Surgical History:  Procedure Laterality Date   ABSCESS DRAINAGE Bilateral 01/10/2015   BREAST BIOPSY Right 02/01/2020   benign   COLONOSCOPY WITH PROPOFOL N/A 06/08/2018   Procedure: COLONOSCOPY WITH PROPOFOL;  Surgeon: Rourk, Robert M, MD;  [facility];  Service: Endoscopy;  Laterality: N/A;  2:30pm   CYST REMOVAL TRUNK     IVC FILTER PLACEMENT ([facility] HX)     LOWER EXTREMITY VENOGRAPHY N/A 02/07/2021   Procedure: LOWER EXTREMITY VENOGRAPHY;  Surgeon: Robins, Joshua E, MD;  [facility];  Service: Cardiovascular;  Laterality: N/A;   PERIPHERAL VASCULAR THROMBECTOMY N/A 02/07/2021   Procedure: PERIPHERAL VASCULAR THROMBECTOMY;  Surgeon: Robins, Joshua E, MD;  [facility];  Service: Cardiovascular;  Laterality: N/A;   RIGHT/LEFT HEART CATH AND CORONARY ANGIOGRAPHY N/A 06/19/2021   Procedure: RIGHT/LEFT HEART CATH AND CORONARY ANGIOGRAPHY;  Surgeon: Goins, Henry W, MD;  [facility];  Service: Cardiovascular;  Laterality: N/A;    Current Medications: Current Meds  Medication Sig   albuterol (VENTOLIN HFA) 108 (90 Base) MCG/ACT inhaler Inhale 2 puffs into the lungs every 2 (two) hours as needed for wheezing or shortness of breath.   atorvastatin (LIPITOR) 20 MG tablet Take 1 tablet (20 mg total) by mouth daily.     empagliflozin (JARDIANCE) 10 MG TABS tablet Take 1 tablet (10 mg total) by mouth daily before breakfast.   furosemide (LASIX) 40 MG tablet Take 40 mg by mouth 2 (two) times daily.   HYDROcodone bit-homatropine (HYCODAN) 5-1.5 MG/5ML syrup Take 5 mLs by mouth every 4 (four) hours as needed for cough.   losartan (COZAAR) 25 MG tablet Take 0.5 tablets (12.5 mg total) by mouth daily.   Magnesium Oxide 400 MG CAPS Take 1 capsule (400 mg total) by mouth daily.   metoprolol succinate (TOPROL-XL) 25 MG 24 hr  tablet Take 1 tablet (25 mg total) by mouth daily. Take with or immediately following a meal.   midodrine (PROAMATINE) 2.5 MG tablet Take 1 tablet (2.5 mg total) by mouth 2 (two) times daily with a meal.   potassium chloride 20 MEQ TBCR Take 20 mEq by mouth 2 (two) times daily.   spironolactone (ALDACTONE) 25 MG tablet Take 0.5 tablets (12.5 mg total) by mouth daily.   warfarin (COUMADIN) 10 MG tablet Take 5-10 mg by mouth See admin instructions. Take as directed in the evening : Monday take 10 mg and all other days take 5 mg.   warfarin (COUMADIN) 5 MG tablet TAKE (1) OR (1) 1/2 TABLET BY MOUTH DAILY AS DIRECTED.     Allergies:   Sulfa antibiotics   Social History   Socioeconomic History   Marital status: Widowed    Spouse name: Not on file   Number of children: Not on file   Years of education: Not on file   Highest education level: Not on file  Occupational History   Not on file  Tobacco Use   Smoking status: Every Day    Years: 18.00    Types: Cigarettes   Smokeless tobacco: Never   Tobacco comments:    Smokes 2-3 cigarettes daily as of 02/13/22  Vaping Use   Vaping Use: Never used  Substance and Sexual Activity   Alcohol use: Not Currently    Alcohol/week: 2.0 standard drinks of alcohol    Types: 2 Cans of beer per week    Comment: twice a month   Drug use: No   Sexual activity: Not Currently    Birth control/protection: Pill, None  Other Topics Concern   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    Has a 46 year old [place].   Live with parents.   Was working 5 days a weeks.   Not working right now.    Attends church.    Social Determinants of Health   Financial Resource Strain: Not on file  Food Insecurity: No Food Insecurity (02/13/2022)   Hunger Vital Sign    Worried About Running Out of Food in the Last Year: Never true    Ran Out of Food in the Last Year: Never true  Transportation Needs: No  Transportation Needs (02/13/2022)   PRAPARE - Transportation    Lack of Transportation (Medical): No    Lack of Transportation (Non-Medical): No  Physical Activity: Not on file  Stress: Not on file  Social Connections: Not on file     Family History: The patient's family history includes Cancer in her maternal grandmother; Diabetes in her paternal aunt and paternal uncle; Heart attack in her paternal grandmother; Hypertension in her mother; Other in her father. There is no history of Colon cancer.  ROS:   Please see the history of present illness.      All other systems reviewed and are negative.  EKGs/Labs/Other Studies Reviewed Today:     30 Day monitor 11/2021   30 day monitor. Data available from 75% of planned monitored time   Min HR 61, Max HR 159, Avg HR 81   Symptoms correlated with sinus rhythm, rare PVCs.   Short runs of SVT at times with aberrancy without symptoms reported. Brief NSVT no reported symptoms Frequent aberrancy  EKG:  Last EKG results: today-  sinus. IRBBB, lateral infarct   Recent Labs: 11/14/2021: TSH 3.122 02/13/2022: ALT 15; B Natriuretic Peptide 1,996.0 02/15/2022: Hemoglobin 10.1; Platelets 195 02/16/2022: BUN 21; Creatinine, Ser 1.11; Magnesium 2.1; Potassium 3.8; Sodium 134     Physical Exam:    VS:  BP 94/64   Pulse 70   Ht 5' 11" (1.803 m)   Wt 276 lb (125.2 kg)   LMP 12/27/2021   SpO2 97%   BMI 38.49 kg/m     Wt Readings from Last 3 Encounters:  02/25/22 276 lb (125.2 kg)  02/16/22 281 lb 15.5 oz (127.9 kg)  01/30/22 (!) 310 lb 9.6 oz (140.9 kg)     GEN:  Well nourished, well developed in no acute distress, obese CARDIAC: RRR, no murmurs, rubs, gallops RESPIRATORY:  Normal work of breathing MUSCULOSKELETAL: slight dependent edema Right breast is markedly larger than left.   ASSESSMENT & PLAN:    Syncope: in setting of gastritis, very likely vasovagal.  NSVT: continue routine medical management of CHF, betablocker. In the  absence of significant symptoms of sustained VT, I do not think she would benefit substantially from an EP study. CHFrEF: EF improved to > 35% on metoprolol, losartan, and spironolactone. Since her EF has improved, ICD is not indicated.      Signed, Giacomo Valone E Eyva Califano, MD  02/25/2022 9:46 AM    [facility] 

## 2022-02-26 ENCOUNTER — Ambulatory Visit: Payer: Medicare HMO | Attending: Cardiology

## 2022-03-01 ENCOUNTER — Encounter: Payer: Self-pay | Admitting: Nurse Practitioner

## 2022-03-01 ENCOUNTER — Ambulatory Visit: Payer: Medicare HMO | Attending: Nurse Practitioner | Admitting: Nurse Practitioner

## 2022-03-01 VITALS — BP 126/84 | HR 84 | Ht 71.0 in | Wt 280.0 lb

## 2022-03-01 DIAGNOSIS — Z79899 Other long term (current) drug therapy: Secondary | ICD-10-CM

## 2022-03-01 DIAGNOSIS — Z87898 Personal history of other specified conditions: Secondary | ICD-10-CM | POA: Diagnosis not present

## 2022-03-01 DIAGNOSIS — D61818 Other pancytopenia: Secondary | ICD-10-CM | POA: Diagnosis not present

## 2022-03-01 DIAGNOSIS — Z86711 Personal history of pulmonary embolism: Secondary | ICD-10-CM | POA: Diagnosis not present

## 2022-03-01 DIAGNOSIS — I4729 Other ventricular tachycardia: Secondary | ICD-10-CM | POA: Diagnosis not present

## 2022-03-01 DIAGNOSIS — Z86718 Personal history of other venous thrombosis and embolism: Secondary | ICD-10-CM

## 2022-03-01 DIAGNOSIS — I10 Essential (primary) hypertension: Secondary | ICD-10-CM | POA: Diagnosis not present

## 2022-03-01 DIAGNOSIS — I2510 Atherosclerotic heart disease of native coronary artery without angina pectoris: Secondary | ICD-10-CM

## 2022-03-01 DIAGNOSIS — M7989 Other specified soft tissue disorders: Secondary | ICD-10-CM

## 2022-03-01 DIAGNOSIS — I428 Other cardiomyopathies: Secondary | ICD-10-CM | POA: Diagnosis not present

## 2022-03-01 DIAGNOSIS — E669 Obesity, unspecified: Secondary | ICD-10-CM

## 2022-03-01 DIAGNOSIS — I5022 Chronic systolic (congestive) heart failure: Secondary | ICD-10-CM

## 2022-03-01 DIAGNOSIS — I5042 Chronic combined systolic (congestive) and diastolic (congestive) heart failure: Secondary | ICD-10-CM

## 2022-03-01 MED ORDER — MIDODRINE HCL 2.5 MG PO TABS: 2.5000 mg | ORAL_TABLET | Freq: Two times a day (BID) | ORAL | 6 refills | Status: DC

## 2022-03-01 NOTE — Progress Notes (Unsigned)
Cardiology Office Note:    Date:  03/01/2022  ID:  JESENIA DILLY, DOB 1975/09/16, MRN [id number]  PCP:  Carrolyn Meiers, [place] [facility] Cardiologist:  Carlyle Dolly, MD Electrophysiologist:  Melida Quitter, MD      Referring MD: Carrolyn Meiers*   CC: Here for follow-up  History of Present Illness:    LOURENE MULHERN is a 47 y.o. female with a hx of the following:  Recurrent unprovoked DVT 01/2021 (missed 2 doses of Coumadin), s/p thrombectomy Hx of unprovoked saddle PE 05/2013, s/p IVC filter Pulmonary HTN Chronic combined CHF, NICM Moderate to severe MR Nonobstructive CAD (Cath 05/2021) Chronic anemia  NSVT HTN OSA on CPAP Obesity  Patient is a pleasant 47 year old female past medical history as mentioned above.  She was evaluated by Dr. Harl Bowie in 2019 for PE in 2015 to get set up with Coumadin clinic.  Hematology recommended Coumadin over DOAC's for her.  In January 2023 she had recurrent left lower extremity DVT in setting of missing 2 doses of Coumadin.  CTA was checked for PE, negative.  Was admitted in May 2023 for community acquired pneumonia, CHF, worsening anemia requiring 3 units of packed red blood cells, echocardiogram revealed mildly reduced EF at 45 to 50% requiring diuresis.    Was readmitted later in May 2023 with respiratory failure requiring intubation, was felt to be likely combination of CHF and pneumonia.  Right left heart cath in May 2023 revealed 40 to 50% pRCA narrowing, otherwise normal coronary arteries.  LVEDP was elevated, consistent with acute on chronic combined CHF.  Blood pressures were soft during admission, therefore limiting medication therapy.  Repeat echo in August 2023 revealed EF reduced to 30 to 35%.    Was admitted in September 2023 with syncope, nausea, dizziness with subsequent C2 fracture.  She was leaning over her truck, felt nauseous, face planted down and went completely out.  Neurosurgery  did not recommend operative management.  Repeat echocardiogram revealed EF 30 to 35%, mild LVH, mildly elevated PASP, mildly reduced RV SF, mild RVE, moderate LAE, mild RAE, moderate to severe MR, mild to moderate TR.  It was felt that syncope was vasovagal, outpatient monitor was recommended.  Preliminary report revealed normal sinus rhythm, occasional NSVT (max lasting 9 beats), 4% PVCs, and less than 1% PACs. Seen by Melina Copa, PA-C on November 13, 2021 for syncope and collapse.  Denied any chest pain, orthopnea, recurrent syncope, or edema.  Did admit to smoking 3 to 4 cigarettes/day.  Cut down her alcohol use.  She was referred to EP and HF clinic.  She was advised not to drive until further eval from EP team. Cardiac MRI arranged but has not been completed. Labs were ordered. Dayna Dunn, PA-C recommended adding losartan 25 mg daily, increasing metoprolol succinate to 50 mg daily, adding potassium chloride 10 mill equivalent daily, adding magnesium oxide 400 mg daily, and repeating labs in 1 week and labs were found to be stable.   Saw Dr. Myles Gip on 12/20/21. CC was swelling of right breast, was evaluated in ER and referred for outpatient management with her gynecologist. Denied exertional CP and shortness of breath.  Dr. Myles Gip stated that in setting of her gastritis, her syncopal episode sounded very likely vasovagal.  Repeat echocardiogram was arranged for after February 14, 2022.  If her EF remained less than 35%, he would recommend placing a defibrillator.   Presented to [facility] ED on 12/28/21 for right  breast pain and swelling, endorsed drainage from nipple. Coumadin was held, was bridged with heparin, and underwent punch biopsy by general surgery on 12/29/21. Pathology showed benign breast parenchyma with acute and chronic inflammation. Ultrasound showed retro areolar abscess and underwent aspiration on 01/01/2022. Hospital course complicated by IDA, required 2 units of PRBC for low hemoglobin. BNP was  elevated at 1232, found to be having acute CHF exacerbation. Received IV Lasix. Cardiology was consulted. Noted lower extremity swelling during hospitalization. Titration of GDMT was limited d/t baseline hypotension during hospitalization, some antihypertensives were held. BLE improved.  Cardiology recommended that she would benefit from outpatient advanced heart failure follow-up due to inability to titrate GDMT. Experienced left thigh pain during hospitalization.  CT performed at that time was negative for fluid collection or hematoma.  Pain began to improve but still noted intermittent lateral discomfort.  She was able to ambulate without difficulty.  Transitioned to PO ABX at d/c.  D/C in stable condition on 01/11/2022.   01/18/2022 - Saw her for follow-up. CC right breast and left thigh pain.  Denied any chest pain.  Admitted to stable shortness of breath with exertion. Sent referral again to HF clinic and also referred to Hematology for hx of PE/DVT.   Established Care with Dr. Daniel Nones 01/30/2022. RHC/TEE was arranged. Was felt HF was stress-induced. Jardiance was started by Dr. Daniel Nones.   Presented back to [facility] 01/2022 for acute on chronic HFrEF. Repeat Echo showed EF 35-40%, low normal RVF, severe MR, mild to mod TR. Received IV Lasix >> switched to PO. She endorsed some indiscretion with fluid intake. Viral panel came back positive for Coronavirus OC43. CXR without consolidation.   03/01/2022 - Today she presents for follow-up. She states she is feeling much better. Swelling is down in her right breast, mammogram came back clear and will repeat next year. Denies any chest pain, shortness of breath, palpitations, syncope, presyncope, dizziness, orthopnea, PND, swelling or significant weight changes, acute bleeding, or claudication. Has lot weight since last visit.   Past Medical History:  Diagnosis Date   Anemia    Blood transfusion without reported diagnosis    CAD (coronary artery disease)     Nonobstructive   Closed left ankle fracture    August 30 2012   Clotting disorder [facility])    DVT (deep venous thrombosis) (HCC)    L leg   Fibroid tumor    HFrEF (heart failure with reduced ejection fraction) (HCC)    Hidradenitis    Hypertension    Mitral regurgitation    NICM (nonischemic cardiomyopathy) (HCC)    NSVT (nonsustained ventricular tachycardia) (HCC)    Obesity    OSA on CPAP    Pulmonary embolism ([place]) 04/2013   Sleep apnea     Past Surgical History:  Procedure Laterality Date   ABSCESS DRAINAGE Bilateral 01/10/2015   BREAST BIOPSY Right 02/01/2020   benign   COLONOSCOPY WITH PROPOFOL N/A 06/08/2018   Procedure: COLONOSCOPY WITH PROPOFOL;  Surgeon: Daneil Dolin, MD;  Location: [facility];  Service: Endoscopy;  Laterality: N/A;  2:30pm   CYST REMOVAL TRUNK     IVC FILTER PLACEMENT ([place] HX)     LOWER EXTREMITY VENOGRAPHY N/A 02/07/2021   Procedure: LOWER EXTREMITY VENOGRAPHY;  Surgeon: [name], MD;  Location: [place] [facility] LAB;  Service: Cardiovascular;  Laterality: N/A;   PERIPHERAL VASCULAR THROMBECTOMY N/A 02/07/2021   Procedure: PERIPHERAL VASCULAR THROMBECTOMY;  Surgeon: [name], MD;  Location: [place] CV  LAB;  Service: Cardiovascular;  Laterality: N/A;   RIGHT/LEFT HEART CATH AND CORONARY ANGIOGRAPHY N/A 06/19/2021   Procedure: RIGHT/LEFT HEART CATH AND CORONARY ANGIOGRAPHY;  Surgeon: Belva Crome, MD;  Location: [place] CV LAB;  Service: Cardiovascular;  Laterality: N/A;    Current Medications: Current Meds  Medication Sig   albuterol (VENTOLIN HFA) 108 (90 Base) MCG/ACT inhaler Inhale 2 puffs into the lungs every 2 (two) hours as needed for wheezing or shortness of breath.   atorvastatin (LIPITOR) 20 MG tablet Take 1 tablet (20 mg total) by mouth daily.   empagliflozin (JARDIANCE) 10 MG TABS tablet Take 1 tablet (10 mg total) by mouth daily before breakfast.   furosemide (LASIX) 40 MG tablet Take 40 mg by mouth 2 (two)  times daily.   losartan (COZAAR) 25 MG tablet Take 0.5 tablets (12.5 mg total) by mouth daily.   Magnesium Oxide 400 MG CAPS Take 1 capsule (400 mg total) by mouth daily.   metoprolol succinate (TOPROL-XL) 25 MG 24 hr tablet Take 1 tablet (25 mg total) by mouth daily. Take with or immediately following a meal.   potassium chloride 20 MEQ TBCR Take 20 mEq by mouth 2 (two) times daily.   spironolactone (ALDACTONE) 25 MG tablet Take 0.5 tablets (12.5 mg total) by mouth daily.   warfarin (COUMADIN) 10 MG tablet Take 5-10 mg by mouth See admin instructions. Take as directed in the evening : Monday take 10 mg and all other days take 5 mg.   warfarin (COUMADIN) 5 MG tablet TAKE (1) OR (1) 1/2 TABLET BY MOUTH DAILY AS DIRECTED.    midodrine (PROAMATINE) 2.5 MG tablet Take 1 tablet (2.5 mg total) by mouth 2 (two) times daily with a meal.     Allergies:   Sulfa antibiotics   Social History   Socioeconomic History   Marital status: Widowed    Spouse name: Not on file   Number of children: Not on file   Years of education: Not on file   Highest education level: Not on file  Occupational History   Not on file  Tobacco Use   Smoking status: Every Day    Years: 18.00    Types: Cigarettes   Smokeless tobacco: Never   Tobacco comments:    Smokes 2-3 cigarettes daily as of 02/13/22  Vaping Use   Vaping Use: Never used  Substance and Sexual Activity   Alcohol use: Not Currently    Alcohol/week: 2.0 standard drinks of alcohol    Types: 2 Cans of beer per week    Comment: twice a month   Drug use: No   Sexual activity: Not Currently    Birth control/protection: Pill, None  Other Topics Concern   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    Has a 46 year old [place].   Live with parents.   Was working 5 days a weeks.   Not working right now.    Attends church.    Social Determinants of Health   Financial Resource Strain: Not on file   Food Insecurity: No Food Insecurity (02/13/2022)   Hunger Vital Sign    Worried About Running Out of Food in the Last Year: Never true    Ran Out of Food in the Last Year: Never true  Transportation Needs: No Transportation Needs (02/13/2022)   PRAPARE - Hydrologist (Medical): No  Lack of Transportation (Non-Medical): No  Physical Activity: Not on file  Stress: Not on file  Social Connections: Not on file     Family History: The patient's family history includes Cancer in her maternal grandmother; Diabetes in her paternal aunt and paternal uncle; Heart attack in her paternal grandmother; Hypertension in her mother; Other in her father. There is no history of Colon cancer.  ROS:    Please see the history of present illness.    All other systems reviewed and are negative.  EKGs/Labs/Other Studies Reviewed:    The following studies were reviewed today:   EKG:  EKG is not ordered today.     Cardiac monitor on 12/17/2021:   30 day monitor. Data available from 75% of planned monitored time   Min HR 61, Max HR 159, Avg HR 81   Symptoms correlated with sinus rhythm, rare PVCs.   Short runs of SVT at times with aberrancy without symptoms reported. 8 and 4  beat run of NSVT no reported symptoms  2D echocardiogram on September 24, 2021: 1. Left ventricular ejection fraction, by estimation, is 30 to 35%. The  left ventricle has moderately decreased function. Left ventricular  endocardial border not optimally defined to evaluate regional wall motion,  global hypokinesis seen. The left  ventricular internal cavity size was moderately dilated. There is mild  left ventricular hypertrophy. Left ventricular diastolic parameters are  indeterminate.   2. Right ventricular systolic function is mildly reduced. The right  ventricular size is mildly enlarged. There is mildly elevated pulmonary  artery systolic pressure. The estimated right ventricular systolic   pressure is [id number] mmHg.   3. Left atrial size was moderately dilated.   4. Right atrial size was mildly dilated.   5. MR mechanism appears to be posterior leaflet restriction with relative  anterior leaflet override with posteriorly directed jet of MR. Review of  serial studies suggests regional wall motion abnormalities contribute to  posterior leaflet restriction. No   pulmonary vein reversal seen on this study, systolic PV blunting.. The  mitral valve is grossly normal. Moderate to severe mitral valve  regurgitation. No evidence of mitral stenosis.   6. Tricuspid valve regurgitation is mild to moderate.   7. The aortic valve is tricuspid. Aortic valve regurgitation is not  visualized. No aortic stenosis is present.   8. The inferior vena cava is dilated in size with <50% respiratory  variability, suggesting right atrial pressure of 15 mmHg.  Right and left heart cath on Jun 19, 2021: 40 to 50% proximal right coronary narrowing. Right dominant anatomy Coronary arteries otherwise normal Significant elevation LVEDP 30 mmHg consistent with acute on chronic combined systolic and diastolic heart failure Mild pulmonary hypertension, mean pressure 30 mmHg.  WHO group II etiology based on hemodysnamics.. Capillary wedge mean 24 mmHg.  V wave to 40 mmHg suggesting some degree of mitral regurgitation. Cardiac output 8.5 L/min with index 3.57 L/min/m. Pulmonary resistance 0.7 Wood units.   RECOMMENDATIONS: Care with IV fluid administration. Needs diuresis. Start heart failure therapy including ARNI and SGLT2 as tolerated.  Peripheral vascular thrombectomy on 02/17/2021: Completion venogram of the left leg demonstrated resolution of the thrombotic burden with no flow-limiting stenosis appreciated through the common femoral vein and femoral vein.  There was narrowing appreciated in the external iliac vein, however as noted on previous CT, this appears to be due to to fibroids.  I elected not to  stent the patient, and will have her see obstetrics and gynecology.  I do not think this was the nidus for her thrombus, but rather having a subtherapeutic INR.   Zela tolerated the case well.  The percutaneous access site was closed with 4-0 Monocryl suture.  Custom compression stockings have been ordered, and the leg was wrapped postoperatively.   Venous ultrasound of lower left extremity on January 30, 2021: Acute, nearly occlusive thrombus extending from the left popliteal vein through the common femoral vein. Extent of thrombus central to the inguinal ligament is unable to be determined on this study. Poor visualization of the calf veins.   Recommend CTV abdomen pelvis to evaluate for central extent of left lower extremity deep vein thrombosis. If thrombus extends into the pelvis, recommend consultation to Interventional Radiology or other qualified endovascular specialist.   Recent Labs: 11/14/2021: TSH 3.122 02/13/2022: ALT 15; B Natriuretic Peptide 1,996.0 02/15/2022: Hemoglobin 10.1; Platelets 195 02/16/2022: BUN 21; Creatinine, Ser 1.11; Magnesium 2.1; Potassium 3.8; Sodium 134  Recent Lipid Panel    Component Value Date/Time   CHOL 77 11/14/2021 0816   TRIG 35 11/14/2021 0816   HDL 27 (L) 11/14/2021 0816   CHOLHDL 2.9 11/14/2021 0816   VLDL 7 11/14/2021 0816   LDLCALC 43 11/14/2021 0816   LDLCALC 92 01/31/2017 1119    Physical Exam:    VS:  BP 126/84   Pulse 84   Ht 5' 11"$  (1.803 m)   Wt 280 lb (127 kg)   LMP 12/27/2021   SpO2 99%   BMI 39.05 kg/m     Wt Readings from Last 3 Encounters:  03/01/22 280 lb (127 kg)  02/25/22 276 lb (125.2 kg)  02/16/22 281 lb 15.5 oz (127.9 kg)     GEN: Obese, 47 y.o. female in no acute distress HEENT: Normal NECK: No JVD; No carotid bruits CARDIAC: S1/S2, RRR, no murmurs, rubs, gallops; 2+ pulses RESPIRATORY:  Clear to auscultation without rales, wheezing or rhonchi  ABDOMEN: Soft, non-tender,  non-distended MUSCULOSKELETAL:  Improved swelling of right breast; Mild, nonpitting edema (L>R), no deformity  SKIN: Warm and dry NEUROLOGIC:  Alert and oriented x 3 PSYCHIATRIC:  Normal affect   ASSESSMENT:    1. Chronic systolic heart failure ([place])   2. NICM (nonischemic cardiomyopathy) ([place])   3. History of syncope   4. NSVT (nonsustained ventricular tachycardia) ([place])   5. Coronary artery disease involving native heart without angina pectoris, unspecified vessel or lesion type   6. Hypertension, unspecified type   7. History of pulmonary embolism   8. History of DVT (deep vein thrombosis)   9. Left leg swelling   10. Pancytopenia ([place])   11. Medication management   12. Obesity (BMI 30-39.9)    PLAN:    In order of problems listed above:  HFrEF, NICM Since I last saw patient she had a hospital readmission 01/2022 for acute on chronic CHF. TTE in 09/2021 showed EF 30 to 35%, moderately decreased left ventricular function, global hypokinesis seen of left ventricle, mild LVH, indeterminate left ventricular diastolic parameters. Euvolemic and well compensated on exam.  Repeat Echo 01/2022 showed EF at 35-40%. Tolerating meds well and is compliant. Previously placed referral to HF clinic.  Etiology unclear for heart failure.  Cardiac MRI has been arranged and ordered previously. Has upcoming right heart cath scheduled for next week.  Continue current medication regimen. Low sodium diet, fluid restriction <2L, and daily weights encouraged. Educated to contact our office for weight gain of 2 lbs overnight or 5 lbs in one week. Dr. Daniel Nones  previously ordered cardiac rehab for her CHF. Okay to start cardiac rehab. Follow-up with HF clinic.     Cardiac Rehabilitation Eligibility Assessment  The patient is ready to start cardiac rehabilitation from a cardiac standpoint.     Hx of syncope and hx of NSVT In the past, syncopal episode was thought to be vasovagal.  Saw Dr. Myles Gip for follow-up  who also believes this was due to vasovagal episode. Monitor revealed NSVT/PVC's. Dr. Myles Gip arranged repeat TTE after February 14, 2022 to determine EF. Saw Dr. Myles Gip 02/25/22 and because EF improved > 35%, ICD was not indicated. Continue current medication regimen.  Cardiac MRI and right heart cath have been previously arranged as mentioned above.  Continue current medication regimen.  Heart healthy diet recommended.  CAD Stable with no anginal symptoms. No indication for ischemic evaluation.  Previous cardiac catheterization 05/2021 revealed nonobstructive CAD.  Not on aspirin due to being on Coumadin.  Continue atorvastatin, losartan, and Toprol-XL.  Heart healthy diet recommended.  HTN Blood pressure today stable.  Continue current medication regimen.   Discussed to monitor BP at home at least 2 hours after medications and sitting for 5-10 minutes. Heart healthy diet recommended. Will refill midodrine per her request.  Hx of PE/DVT, left leg swelling Previous history of unprovoked saddle PE in 2015.  History of recurrent unprovoked DVT with hx of thrombectomy in 01/2021. Has IVC filter placed, could not be removed previously d/t significant thrombus burden on filter. Denies any bleeding issues while on Coumadin.  Continue to follow-up with Coumadin clinic. Does have lower extremity swelling (L>R), will r/o DVT and arrange lower extremity dopplers. Has hx of recurrent left lower extremity DVT.   Hx of pancytopenia Hospital course revealed low WBC, minimally low RBC, and low platelets. Received PRBC in hospital. Denies any fever, chills, or flu like symptoms.  Has had history of VTE.  She says she has been seen by Hem/Onc in the past, previously placed Hem/Onc referral. Will obtain CBC.   7. Obesity BMI today 39.05. Weight loss via diet and exercise as tolerated encouraged. Discussed the impact being overweight would have on cardiovascular risk.  8.  Disposition: Will obtain labs (CBC & BMET). Follow  up with me or APP  in 6-8 weeks. Follow up with Dr. Carlyle Dolly in 6 months.  Medication Adjustments/Labs and Tests Ordered: Current medicines are reviewed at length with the patient today.  Concerns regarding medicines are outlined above.  Orders Placed This Encounter  Procedures   CBC   Basic metabolic panel   VAS [place] LOWER EXTREMITY VENOUS (DVT)   Meds ordered this encounter  Medications   midodrine (PROAMATINE) 2.5 MG tablet    Sig: Take 1 tablet (2.5 mg total) by mouth 2 (two) times daily with a meal.    Dispense:  60 tablet    Refill:  6    Patient Instructions  Medication Instructions:  Midodrine refilled today  Continue all other medications.     Labwork: CBC, BMET - orders given today  Please do in 1 week   Testing/Procedures: Your physician has requested that you have a lower extremity venous duplex. During this test, ultrasound is used to evaluate venous blood flow in the legs. Allow one hour for this exam. There are no restrictions or special instructions.   Follow-Up: Office will contact with results via phone, letter or mychart.    1 month   Any Other Special Instructions Will Be Listed Below (If Applicable).   If  you need a refill on your cardiac medications before your next appointment, please call your pharmacy.    Signed, Finis Bud, NP  03/02/2022 7:31 PM    [place]

## 2022-03-01 NOTE — Patient Instructions (Addendum)
Medication Instructions:  Midodrine refilled today  Continue all other medications.     Labwork: CBC, BMET - orders given today  Please do in 1 week   Testing/Procedures: Your physician has requested that you have a lower extremity venous duplex. During this test, ultrasound is used to evaluate venous blood flow in the legs. Allow one hour for this exam. There are no restrictions or special instructions.   Follow-Up: Office will contact with results via phone, letter or mychart.    1 month   Any Other Special Instructions Will Be Listed Below (If Applicable).   If you need a refill on your cardiac medications before your next appointment, please call your pharmacy.

## 2022-03-04 ENCOUNTER — Other Ambulatory Visit (HOSPITAL_COMMUNITY): Payer: Self-pay

## 2022-03-04 DIAGNOSIS — I5042 Chronic combined systolic (congestive) and diastolic (congestive) heart failure: Secondary | ICD-10-CM

## 2022-03-05 ENCOUNTER — Encounter (HOSPITAL_COMMUNITY): Payer: Self-pay | Admitting: Hematology

## 2022-03-08 ENCOUNTER — Encounter (HOSPITAL_COMMUNITY)
Admission: RE | Disposition: A | Discharge status: Home / Self Care | Payer: Self-pay | Source: Home / Self Care | Attending: Cardiology

## 2022-03-08 ENCOUNTER — Ambulatory Visit (HOSPITAL_BASED_OUTPATIENT_CLINIC_OR_DEPARTMENT_OTHER): Payer: Medicare HMO | Admitting: Certified Registered"

## 2022-03-08 ENCOUNTER — Other Ambulatory Visit: Payer: Self-pay

## 2022-03-08 ENCOUNTER — Ambulatory Visit (HOSPITAL_COMMUNITY): Payer: Medicare HMO | Admitting: Certified Registered"

## 2022-03-08 ENCOUNTER — Ambulatory Visit (HOSPITAL_COMMUNITY)
Admission: RE | Admit: 2022-03-08 | Discharge: 2022-03-08 | Disposition: A | Discharge status: Home / Self Care | Payer: Medicare HMO | Attending: Cardiology | Admitting: Cardiology

## 2022-03-08 ENCOUNTER — Ambulatory Visit (HOSPITAL_BASED_OUTPATIENT_CLINIC_OR_DEPARTMENT_OTHER)
Admission: RE | Admit: 2022-03-08 | Discharge: 2022-03-08 | Disposition: A | Discharge status: Home / Self Care | Payer: Medicare HMO | Source: Home / Self Care | Attending: Cardiology | Admitting: Cardiology

## 2022-03-08 ENCOUNTER — Encounter (HOSPITAL_COMMUNITY): Payer: Self-pay | Admitting: Cardiology

## 2022-03-08 DIAGNOSIS — R55 Syncope and collapse: Secondary | ICD-10-CM | POA: Insufficient documentation

## 2022-03-08 DIAGNOSIS — I340 Nonrheumatic mitral (valve) insufficiency: Secondary | ICD-10-CM | POA: Diagnosis not present

## 2022-03-08 DIAGNOSIS — Z86711 Personal history of pulmonary embolism: Secondary | ICD-10-CM | POA: Insufficient documentation

## 2022-03-08 DIAGNOSIS — Z86718 Personal history of other venous thrombosis and embolism: Secondary | ICD-10-CM | POA: Insufficient documentation

## 2022-03-08 DIAGNOSIS — I472 Ventricular tachycardia, unspecified: Secondary | ICD-10-CM | POA: Insufficient documentation

## 2022-03-08 DIAGNOSIS — I081 Rheumatic disorders of both mitral and tricuspid valves: Secondary | ICD-10-CM | POA: Insufficient documentation

## 2022-03-08 DIAGNOSIS — I110 Hypertensive heart disease with heart failure: Secondary | ICD-10-CM | POA: Diagnosis not present

## 2022-03-08 DIAGNOSIS — R0602 Shortness of breath: Secondary | ICD-10-CM

## 2022-03-08 DIAGNOSIS — Z79899 Other long term (current) drug therapy: Secondary | ICD-10-CM | POA: Diagnosis not present

## 2022-03-08 DIAGNOSIS — I2510 Atherosclerotic heart disease of native coronary artery without angina pectoris: Secondary | ICD-10-CM

## 2022-03-08 DIAGNOSIS — G4733 Obstructive sleep apnea (adult) (pediatric): Secondary | ICD-10-CM | POA: Diagnosis not present

## 2022-03-08 DIAGNOSIS — I272 Pulmonary hypertension, unspecified: Secondary | ICD-10-CM | POA: Insufficient documentation

## 2022-03-08 DIAGNOSIS — I509 Heart failure, unspecified: Secondary | ICD-10-CM | POA: Diagnosis not present

## 2022-03-08 DIAGNOSIS — I5022 Chronic systolic (congestive) heart failure: Secondary | ICD-10-CM | POA: Diagnosis not present

## 2022-03-08 DIAGNOSIS — I5043 Acute on chronic combined systolic (congestive) and diastolic (congestive) heart failure: Secondary | ICD-10-CM

## 2022-03-08 DIAGNOSIS — I361 Nonrheumatic tricuspid (valve) insufficiency: Secondary | ICD-10-CM

## 2022-03-08 DIAGNOSIS — F17210 Nicotine dependence, cigarettes, uncomplicated: Secondary | ICD-10-CM

## 2022-03-08 DIAGNOSIS — R69 Illness, unspecified: Secondary | ICD-10-CM | POA: Diagnosis not present

## 2022-03-08 HISTORY — PX: RIGHT HEART CATH: CATH118263

## 2022-03-08 HISTORY — PX: TEE WITHOUT CARDIOVERSION: SHX5443

## 2022-03-08 LAB — BASIC METABOLIC PANEL
Anion gap: 7 (ref 5–15)
BUN: 25 mg/dL — ABNORMAL HIGH (ref 6–20)
CO2: 28 mmol/L (ref 22–32)
Calcium: 9 mg/dL (ref 8.9–10.3)
Chloride: 101 mmol/L (ref 98–111)
Creatinine, Ser: 1.25 mg/dL — ABNORMAL HIGH (ref 0.44–1.00)
GFR, Estimated: 54 mL/min — ABNORMAL LOW (ref >60)
Glucose, Bld: 81 mg/dL (ref 70–99)
Potassium: 4.3 mmol/L (ref 3.5–5.1)
Sodium: 136 mmol/L (ref 135–145)

## 2022-03-08 LAB — POCT I-STAT EG7
Acid-Base Excess: 2 mmol/L (ref 0.0–2.0)
Acid-Base Excess: 2 mmol/L (ref 0.0–2.0)
Bicarbonate: 28.9 mmol/L — ABNORMAL HIGH (ref 20.0–28.0)
Bicarbonate: 29.2 mmol/L — ABNORMAL HIGH (ref 20.0–28.0)
Calcium, Ion: 1.25 mmol/L (ref 1.15–1.40)
Calcium, Ion: 1.25 mmol/L (ref 1.15–1.40)
HCT: 37 % (ref 36.0–46.0)
HCT: 37 % (ref 36.0–46.0)
Hemoglobin: 12.6 g/dL (ref 12.0–15.0)
Hemoglobin: 12.6 g/dL (ref 12.0–15.0)
O2 Saturation: 44 %
O2 Saturation: 45 %
Potassium: 4.1 mmol/L (ref 3.5–5.1)
Potassium: 4.1 mmol/L (ref 3.5–5.1)
Sodium: 140 mmol/L (ref 135–145)
Sodium: 140 mmol/L (ref 135–145)
TCO2: 31 mmol/L (ref 22–32)
TCO2: 31 mmol/L (ref 22–32)
pCO2, Ven: 56.9 mmHg (ref 44–60)
pCO2, Ven: 56.9 mmHg (ref 44–60)
pH, Ven: 7.314 (ref 7.25–7.43)
pH, Ven: 7.319 (ref 7.25–7.43)
pO2, Ven: 27 mmHg — CL (ref 32–45)
pO2, Ven: 27 mmHg — CL (ref 32–45)

## 2022-03-08 LAB — BRAIN NATRIURETIC PEPTIDE: B Natriuretic Peptide: 1735.4 pg/mL — ABNORMAL HIGH (ref 0.0–100.0)

## 2022-03-08 LAB — PROTIME-INR
INR: 1.8 — ABNORMAL HIGH (ref 0.8–1.2)
Prothrombin Time: 20.6 seconds — ABNORMAL HIGH (ref 11.4–15.2)

## 2022-03-08 SURGERY — RIGHT HEART CATH
Anesthesia: LOCAL

## 2022-03-08 SURGERY — ECHOCARDIOGRAM, TRANSESOPHAGEAL
Anesthesia: Monitor Anesthesia Care

## 2022-03-08 MED ORDER — LIDOCAINE HCL (PF) 1 % IJ SOLN
INTRAMUSCULAR | Status: DC | PRN
  Administered 2022-03-08: 2 mL

## 2022-03-08 MED ORDER — PROPOFOL 10 MG/ML IV BOLUS
INTRAVENOUS | Status: DC | PRN
  Administered 2022-03-08: 30 mg via INTRAVENOUS

## 2022-03-08 MED ORDER — PROPOFOL 500 MG/50ML IV EMUL
INTRAVENOUS | Status: DC | PRN
  Administered 2022-03-08: 125 ug/kg/min via INTRAVENOUS

## 2022-03-08 MED ORDER — HEPARIN (PORCINE) IN NACL 1000-0.9 UT/500ML-% IV SOLN
INTRAVENOUS | Status: DC | PRN
  Administered 2022-03-08: 500 mL

## 2022-03-08 MED ORDER — SODIUM CHLORIDE 0.9 % IV SOLN: 250.0000 mL | INTRAVENOUS | Status: DC | PRN

## 2022-03-08 MED ORDER — SODIUM CHLORIDE 0.9 % IV SOLN: INTRAVENOUS | Status: DC

## 2022-03-08 MED ORDER — LIDOCAINE HCL (PF) 1 % IJ SOLN
INTRAMUSCULAR | Status: AC
  Filled 2022-03-08: qty 30

## 2022-03-08 MED ORDER — BUTAMBEN-TETRACAINE-BENZOCAINE 2-2-14 % EX AERO
INHALATION_SPRAY | CUTANEOUS | Status: DC | PRN
  Administered 2022-03-08: 2 via TOPICAL

## 2022-03-08 MED ORDER — LIDOCAINE 2% (20 MG/ML) 5 ML SYRINGE
INTRAMUSCULAR | Status: DC | PRN
  Administered 2022-03-08: 40 mg via INTRAVENOUS

## 2022-03-08 MED ORDER — PHENYLEPHRINE 80 MCG/ML (10ML) SYRINGE FOR IV PUSH (FOR BLOOD PRESSURE SUPPORT)
PREFILLED_SYRINGE | INTRAVENOUS | Status: DC | PRN
  Administered 2022-03-08: 80 ug via INTRAVENOUS

## 2022-03-08 MED ORDER — SODIUM CHLORIDE 0.9% FLUSH: 3.0000 mL | INTRAVENOUS | Status: DC | PRN

## 2022-03-08 MED ORDER — SODIUM CHLORIDE 0.9% FLUSH: 3.0000 mL | Freq: Two times a day (BID) | INTRAVENOUS | Status: DC

## 2022-03-08 SURGICAL SUPPLY — 5 items
CATH BALLN WEDGE 5F 110CM (CATHETERS) IMPLANT
GUIDEWIRE .025 260CM (WIRE) IMPLANT
PACK CARDIAC CATHETERIZATION (CUSTOM PROCEDURE TRAY) ×1 IMPLANT
SHEATH GLIDE SLENDER 4/5FR (SHEATH) IMPLANT
TRANSDUCER W/STOPCOCK (MISCELLANEOUS) ×1 IMPLANT

## 2022-03-08 NOTE — CV Procedure (Signed)
   TRANSESOPHAGEAL ECHOCARDIOGRAM GUIDED DIRECT CURRENT CARDIOVERSION  NAME:  LEDELL SHOJI    MRN: [id number] DOB:  [birth year]    ADMIT DATE: 03/08/2022  INDICATIONS: Severe mitral regurgitation  PROCEDURE:   Informed consent was obtained prior to the procedure. The risks, benefits and alternatives for the procedure were discussed and the patient comprehended these risks.  Risks include, but are not limited to, cough, sore throat, vomiting, nausea, somnolence, esophageal and stomach trauma or perforation, bleeding, low blood pressure, aspiration, pneumonia, infection, trauma to the teeth and death.    After a procedural time-out, the oropharynx was anesthetized and the patient was sedated by the anesthesia service. The transesophageal probe was inserted in the esophagus and stomach without difficulty and multiple views were obtained. Sedation by anesthesia.   COMPLICATIONS:    Complications: No complications Patient tolerated procedure well.  KEY FINDINGS:  Left ventricle: severely dilated with global hypokinesis and an estimated EF of 30% Right ventricle: dilated with mildly reduced function.  Mitral valve: severe eccentric posteriorly directed functional MR; poor leaflet coaptation.  Tricuspid valve: mild TR Pulmonic valve: trivial PI LAA: no LAA thrombus  Full report to follow.   Jenet Durio Advanced Heart Failure 8:27 AM

## 2022-03-08 NOTE — Transfer of Care (Signed)
Immediate Anesthesia Transfer of Care Note  Patient: [name] [name]  Procedure(s) Performed: TRANSESOPHAGEAL ECHOCARDIOGRAM (TEE)  Patient Location: Endoscopy Unit  Anesthesia Type:MAC  Level of Consciousness: awake, alert , and sedated  Airway & Oxygen Therapy: Patient connected to nasal cannula oxygen  Post-op Assessment: Post -op Vital signs reviewed and stable  Post vital signs: stable  Last Vitals:  Vitals Value Taken Time  BP    Temp    Pulse    Resp    SpO2      Last Pain:  Vitals:   03/08/22 0743  TempSrc: Oral  PainSc:          Complications: No notable events documented.

## 2022-03-08 NOTE — Progress Notes (Signed)
Urine pregnancy test negative

## 2022-03-08 NOTE — Anesthesia Preprocedure Evaluation (Signed)
Anesthesia Evaluation  Patient identified by MRN, date of birth, ID band Patient awake    Reviewed: Allergy & Precautions, NPO status , Patient's Chart, lab work & pertinent test results  History of Anesthesia Complications Negative for: history of anesthetic complications  Airway Mallampati: III  TM Distance: >3 FB Neck ROM: Full    Dental  (+) Dental Advisory Given   Pulmonary neg shortness of breath, sleep apnea , neg COPD, neg recent URI, Current Smoker and Patient abstained from smoking.   breath sounds clear to auscultation       Cardiovascular hypertension, + CAD and +CHF  + Valvular Problems/Murmurs MR  Rhythm:Regular   1. Inferolateral akinesis with overall moderate LV dysfunction; severe,  posteriorly directed, ischemic MR.   2. Left ventricular ejection fraction, by estimation, is 35 to 40%. The  left ventricle has moderately decreased function. The left ventricle  demonstrates regional wall motion abnormalities (see scoring  diagram/findings for description). The left  ventricular internal cavity size was severely dilated. Left ventricular  diastolic parameters are indeterminate. Elevated left atrial pressure.   3. Right ventricular systolic function is low normal. The right  ventricular size is mildly enlarged. There is mildly elevated pulmonary  artery systolic pressure.   4. Left atrial size was moderately dilated.   5. Right atrial size was mildly dilated.   6. The mitral valve is degenerative. Severe mitral valve regurgitation.  No evidence of mitral stenosis.   7. Tricuspid valve regurgitation is mild to moderate.   8. The aortic valve is tricuspid. Aortic valve regurgitation is not  visualized. No aortic stenosis is present.   9. The inferior vena cava is dilated in size with <50% respiratory  variability, suggesting right atrial pressure of 15 mmHg.     Neuro/Psych negative neurological ROS  negative  psych ROS   GI/Hepatic   Endo/Other    Renal/GU      Musculoskeletal   Abdominal   Peds  Hematology   Anesthesia Other Findings   Reproductive/Obstetrics                             Anesthesia Physical Anesthesia Plan  ASA: 3  Anesthesia Plan: MAC   Post-op Pain Management: Minimal or no pain anticipated   Induction: Intravenous  PONV Risk Score and Plan: 1 and Propofol infusion and Treatment may vary due to age or medical condition  Airway Management Planned: Nasal Cannula and Natural Airway  Additional Equipment: None  Intra-op Plan:   Post-operative Plan:   Informed Consent: I have reviewed the patients History and Physical, chart, labs and discussed the procedure including the risks, benefits and alternatives for the proposed anesthesia with the patient or authorized representative who has indicated his/her understanding and acceptance.     Dental advisory given  Plan Discussed with: CRNA  Anesthesia Plan Comments:        Anesthesia Quick Evaluation

## 2022-03-08 NOTE — Discharge Instructions (Signed)

## 2022-03-08 NOTE — Interval H&P Note (Signed)
History and Physical Interval Note:  03/08/2022 7:41 AM  [name] [name]  has presented today for surgery, with the diagnosis of chf.  The various methods of treatment have been discussed with the patient and family. After consideration of risks, benefits and other options for treatment, the patient has consented to  Procedure(s): RIGHT HEART CATH (N/A) as a surgical intervention.  The patient's history has been reviewed, patient examined, no change in status, stable for surgery.  I have reviewed the patient's chart and labs.  Questions were answered to the patient's satisfaction.     [name]

## 2022-03-09 NOTE — Anesthesia Postprocedure Evaluation (Signed)
Anesthesia Post Note  Patient: [name] [name]  Procedure(s) Performed: TRANSESOPHAGEAL ECHOCARDIOGRAM (TEE)     Patient location during evaluation: Endoscopy Anesthesia Type: MAC Level of consciousness: awake and patient cooperative Pain management: pain level controlled Vital Signs Assessment: post-procedure vital signs reviewed and stable Respiratory status: spontaneous breathing, nonlabored ventilation, respiratory function stable and patient connected to nasal cannula oxygen Cardiovascular status: stable Postop Assessment: no apparent nausea or vomiting Anesthetic complications: no   No notable events documented.  Last Vitals:  Vitals:   03/08/22 1021 03/08/22 1035  BP: (!) 116/93 116/86  Pulse:    Resp:    Temp:    SpO2:      Last Pain:  Vitals:   03/08/22 1000  TempSrc:   PainSc: 0-No pain                 Skyelyn Scruggs

## 2022-03-10 ENCOUNTER — Encounter (HOSPITAL_COMMUNITY): Payer: Self-pay | Admitting: Cardiology

## 2022-03-11 ENCOUNTER — Telehealth: Payer: Self-pay | Admitting: Physician Assistant

## 2022-03-11 ENCOUNTER — Ambulatory Visit (HOSPITAL_COMMUNITY)
Admission: RE | Admit: 2022-03-11 | Discharge: 2022-03-11 | Disposition: A | Discharge status: Home / Self Care | Payer: Medicare HMO | Source: Ambulatory Visit | Attending: Cardiology | Admitting: Cardiology

## 2022-03-11 ENCOUNTER — Encounter (HOSPITAL_COMMUNITY): Payer: Self-pay | Admitting: Cardiology

## 2022-03-11 VITALS — BP 90/60 | HR 68 | Wt 263.2 lb

## 2022-03-11 DIAGNOSIS — I5022 Chronic systolic (congestive) heart failure: Secondary | ICD-10-CM | POA: Diagnosis not present

## 2022-03-11 DIAGNOSIS — Z8249 Family history of ischemic heart disease and other diseases of the circulatory system: Secondary | ICD-10-CM | POA: Insufficient documentation

## 2022-03-11 DIAGNOSIS — Z86718 Personal history of other venous thrombosis and embolism: Secondary | ICD-10-CM | POA: Diagnosis not present

## 2022-03-11 DIAGNOSIS — D649 Anemia, unspecified: Secondary | ICD-10-CM | POA: Diagnosis not present

## 2022-03-11 DIAGNOSIS — Z6841 Body Mass Index (BMI) 40.0 and over, adult: Secondary | ICD-10-CM | POA: Diagnosis not present

## 2022-03-11 DIAGNOSIS — I110 Hypertensive heart disease with heart failure: Secondary | ICD-10-CM | POA: Diagnosis not present

## 2022-03-11 DIAGNOSIS — I2510 Atherosclerotic heart disease of native coronary artery without angina pectoris: Secondary | ICD-10-CM | POA: Diagnosis not present

## 2022-03-11 DIAGNOSIS — Z86711 Personal history of pulmonary embolism: Secondary | ICD-10-CM | POA: Diagnosis not present

## 2022-03-11 DIAGNOSIS — I428 Other cardiomyopathies: Secondary | ICD-10-CM | POA: Diagnosis not present

## 2022-03-11 DIAGNOSIS — F17210 Nicotine dependence, cigarettes, uncomplicated: Secondary | ICD-10-CM | POA: Insufficient documentation

## 2022-03-11 DIAGNOSIS — N920 Excessive and frequent menstruation with regular cycle: Secondary | ICD-10-CM | POA: Diagnosis not present

## 2022-03-11 DIAGNOSIS — I340 Nonrheumatic mitral (valve) insufficiency: Secondary | ICD-10-CM

## 2022-03-11 DIAGNOSIS — Z79899 Other long term (current) drug therapy: Secondary | ICD-10-CM | POA: Insufficient documentation

## 2022-03-11 DIAGNOSIS — Z7901 Long term (current) use of anticoagulants: Secondary | ICD-10-CM | POA: Insufficient documentation

## 2022-03-11 LAB — BASIC METABOLIC PANEL
Anion gap: 8 (ref 5–15)
BUN: 20 mg/dL (ref 6–20)
CO2: 31 mmol/L (ref 22–32)
Calcium: 9.2 mg/dL (ref 8.9–10.3)
Chloride: 101 mmol/L (ref 98–111)
Creatinine, Ser: 1.1 mg/dL — ABNORMAL HIGH (ref 0.44–1.00)
GFR, Estimated: >60 mL/min (ref >60)
Glucose, Bld: 91 mg/dL (ref 70–99)
Potassium: 4.6 mmol/L (ref 3.5–5.1)
Sodium: 140 mmol/L (ref 135–145)

## 2022-03-11 LAB — BRAIN NATRIURETIC PEPTIDE: B Natriuretic Peptide: 1854.6 pg/mL — ABNORMAL HIGH (ref 0.0–100.0)

## 2022-03-11 MED ORDER — SPIRONOLACTONE 25 MG PO TABS: 25.0000 mg | ORAL_TABLET | Freq: Every day | ORAL | 3 refills | Status: DC

## 2022-03-11 MED ORDER — POTASSIUM CHLORIDE ER 10 MEQ PO TBCR: 20.0000 meq | EXTENDED_RELEASE_TABLET | Freq: Two times a day (BID) | ORAL | 3 refills | Status: DC

## 2022-03-11 MED ORDER — FUROSEMIDE 40 MG PO TABS: 40.0000 mg | ORAL_TABLET | Freq: Two times a day (BID) | ORAL | 3 refills | Status: DC

## 2022-03-11 NOTE — Patient Instructions (Signed)
INCREASE Spironolactone to 49m daily  LASIX 821min the morning and 4054mn the evenings for 5 days, then change it to 40 mg Twice daily  Labs done today, your results will be available in MyChart, we will contact you for abnormal readings.  Your physician recommends that you schedule a follow-up appointment in: 1 month with the Nurse Practitioner and 3 months with Dr. SabDaniel NonesIf you have any questions or concerns before your next appointment please send us Koreamessage through [facility] call our office at [phone]  TO LEAVE A MESSAGE FOR THE NURSE SELECT OPTION 2, PLEASE LEAVE A MESSAGE INCLUDING: YOUR NAME DATE OF BIRTH CALL BACK NUMBER REASON FOR CALL**this is important as we prioritize the call backs  YOU WILL RECEIVE A CALL BACK THE SAME DAY AS LONG AS YOU CALL BEFORE 4:00 PM  At the [facility] and your health needs are our priority. As part of our continuing mission to provide you with exceptional heart care, we have created designated Provider Care Teams. These Care Teams include your primary Cardiologist (physician) and Advanced Practice Providers (APPs- Physician Assistants and Nurse Practitioners) who all work together to provide you with the care you need, when you need it.   You may see any of the following providers on your designated Care Team at your next follow up: Dr DanGlori Bickers DalLoralie Champagne. AdiRoxana HiresP BriLyda JesterA [facility]awanoA UtahmForestine NaP LauAudry RilesharmD   Please be sure to bring in all your medications bottles to every appointment.    Thank you for choosing [facility]

## 2022-03-11 NOTE — Progress Notes (Signed)
ADVANCED HEART FAILURE CLINIC NOTE  Referring Physician: Carrolyn [name]*  Primary Care: [name] Meiers, MD Primary Cardiologist: [name] Dolly, MD EP: [name] Albino, MD  HPI: [name] [name] is a 47 y.o. female with hx of PE (2015), unprovoked DVT, HFrEF, pancytopenia, nonobstructive CAD, with frequent admissions for shortness of breath 2/2 HFrEF exacerbations, morbid obesity presenting today to establish care.   According to [name] her functional decline and most of her cardiac history dates back to 2014/2015 when she was diagnosed with unprovoked DVT & PE. Based on chart review, she had a saddle PE extending from the distal main PA to left and right pulmonary arteries w extension to subsegmental arteries. Prior to that she was fairly ambulatory. After her PE she noticed a significant decline in ability to ambulate leading to further weight gain. She was seen by [facility] hematology where hypercoagulable work-up was negative and she was started on life long anticoagulation. In the interim she also had an IVC filter placed that later could not be retrieved due to extensive thrombus burden (2015). Over the past 1 year she has had fairly poor functional status. She has a 6 year daughter that she is trying to take care of but becomes dyspneic after walking > 10-15 feet. In 5/23 she was admitted with acute hypoxic respiratory failure requiring intubation. LHC during that admit w/ nonobstructive CAD. She was admitted again in 9/23 with syncope, nausea and C2 fracture managed conservatively. LVEF during that admit noted to be 35% w/ severe posterior MR. Otherwise she had one additional hospitalization for acute on chronic HFrEF exacerbation. In addition, she is struggling with pancytopenia; anemia believed to be secondary to severe menorrhagia (followed by hematology).   Interval hx:  Since our last visit, underwent TEE/RHC which demonstrated severe eccentric mitral regurgitation; RHC  w/ elevated filling pressures and a moderately reduced cardiac index. We increased her lasix after the RHC. Today she walked from [facility] to our clinic with only mild shortness of breath; previously she was using a wheelchair to get from [place] to our clinic. She is now able to go to the grocery store and push a cart. No longer waking up at night due to shortness of breath after increasing her lasix.   Activity level/exercise tolerance:  NYHA IIB Orthopnea:  Sleeping flat now Paroxysmal noctural dyspnea:  No Chest pain/pressure:  no Orthostatic lightheadedness:  minimal Palpitations:  no Lower extremity edema:  improved, 1+ Presyncope/syncope:  improved Cough:  No  Past Medical History:  Diagnosis Date   Anemia    Blood transfusion without reported diagnosis    CAD (coronary artery disease)    Nonobstructive   Closed left ankle fracture    August 30 2012   Clotting disorder (HCC)    DVT (deep venous thrombosis) (HCC)    L leg   Fibroid tumor    HFrEF (heart failure with reduced ejection fraction) (HCC)    Hidradenitis    Hypertension    Mitral regurgitation    NICM (nonischemic cardiomyopathy) (HCC)    NSVT (nonsustained ventricular tachycardia) (HCC)    Obesity    OSA on CPAP    Pulmonary embolism ([place]) 04/2013   Sleep apnea     Current Outpatient Medications  Medication Sig Dispense Refill   albuterol (VENTOLIN HFA) 108 (90 Base) MCG/ACT inhaler Inhale 2 puffs into the lungs every 2 (two) hours as needed for wheezing or shortness of breath. 18 g 11   atorvastatin (LIPITOR) 20 MG tablet  Take 1 tablet (20 mg total) by mouth daily. 90 tablet 3   empagliflozin (JARDIANCE) 10 MG TABS tablet Take 1 tablet (10 mg total) by mouth daily before breakfast. 90 tablet 3   furosemide (LASIX) 80 MG tablet Take 40 mg by mouth 2 (two) times daily.     losartan (COZAAR) 25 MG tablet Take 0.5 tablets (12.5 mg total) by mouth daily. 90 tablet 3   Magnesium Oxide 400 MG CAPS Take 1 capsule (400  mg total) by mouth daily. 90 capsule 3   metoprolol succinate (TOPROL-XL) 25 MG 24 hr tablet Take 1 tablet (25 mg total) by mouth daily. Take with or immediately following a meal. 90 tablet 3   midodrine (PROAMATINE) 2.5 MG tablet Take 1 tablet (2.5 mg total) by mouth 2 (two) times daily with a meal. 60 tablet 6   potassium chloride 20 MEQ TBCR Take 20 mEq by mouth 2 (two) times daily. 90 tablet 3   spironolactone (ALDACTONE) 25 MG tablet Take 0.5 tablets (12.5 mg total) by mouth daily. 30 tablet 3   warfarin (COUMADIN) 10 MG tablet Take 10 mg by mouth every Monday. In the evening     warfarin (COUMADIN) 5 MG tablet TAKE (1) OR (1) 1/2 TABLET BY MOUTH DAILY AS DIRECTED. (Patient taking differently: Take 5 mg by mouth See admin instructions. Take 5 mg every evening  EXCEPT: Monday take 10 mg (Tablet))     No current facility-administered medications for this encounter.   Facility-Administered Medications Ordered in Other Encounters  Medication Dose Route Frequency Provider Last Rate Last Admin   0.9 %  sodium chloride infusion   Intravenous Continuous [name] Cancer, PA-C 300 mL/hr at 03/08/22 0828 New Bag at 03/08/22 0828    Allergies  Allergen Reactions   Sulfa Antibiotics Itching, Swelling and Rash    Lip swelling      Social History   Socioeconomic History   Marital status: Widowed    Spouse name: Not on file   Number of children: Not on file   Years of education: Not on file   Highest education level: Not on file  Occupational History   Not on file  Tobacco Use   Smoking status: Every Day    Years: 18.00    Types: Cigarettes   Smokeless tobacco: Never   Tobacco comments:    Smokes 2-3 cigarettes daily as of 02/13/22  Vaping Use   Vaping Use: Never used  Substance and Sexual Activity   Alcohol use: Not Currently    Alcohol/week: 2.0 standard drinks of alcohol    Types: 2 Cans of beer per week    Comment: twice a month   Drug use: No   Sexual activity: Not Currently     Birth control/protection: Pill, None  Other Topics Concern   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    Has a 46 year old [place].   Live with parents.   Was working 5 days a weeks.   Not working right now.    Attends church.    Social Determinants of Health   Financial Resource Strain: Not on file  Food Insecurity: No Food Insecurity (02/13/2022)   Hunger Vital Sign    Worried About Running Out of Food in the Last Year: Never true    Ran Out of Food in the Last Year: Never true  Transportation Needs: No Transportation Needs (02/13/2022)  PRAPARE - Hydrologist (Medical): No    Lack of Transportation (Non-Medical): No  Physical Activity: Not on file  Stress: Not on file  Social Connections: Not on file  Intimate Partner Violence: Not At Risk (02/13/2022)   Humiliation, Afraid, Rape, and Kick questionnaire    Fear of Current or Ex-Partner: No    Emotionally Abused: No    Physically Abused: No    Sexually Abused: No      Family History  Problem Relation Age of Onset   Hypertension Mother    Diabetes Paternal Aunt    Diabetes Paternal Uncle    Other Father        blood clots   [name] Maternal Grandmother    Heart attack Paternal Grandmother    Colon [name] Neg Hx     PHYSICAL EXAM: Vitals:   03/11/22 1040  BP: 90/60  Pulse: 68  SpO2: 99%   GENERAL: NAD HEENT: Negative for arcus senilis or xanthelasma. There is no scleral icterus.  The mucous membranes are pink and moist.   NECK: Supple, No masses. Normal carotid upstrokes without bruits. No masses or thyromegaly.    CHEST: There are no chest wall deformities. There is no chest wall tenderness. Respirations are unlabored.  Lungs- CTA b/l; no crackles.  CARDIAC:  JVP: 12 with V waves.          Normal S1, S2  Normal rate with regular rhythm. 3/6 SEM, rubs or gallops.  Pulses are 2+ and symmetrical in upper and lower  extremities. 1-2+ edema in b/l LE ABDOMEN: Soft, non-tender, non-distended. There are no masses or hepatomegaly. There are normal bowel sounds.  EXTREMITIES: warm with mild edema.  LYMPHATIC: No axillary or supraclavicular lymphadenopathy.  NEUROLOGIC: Patient is oriented x3 with no focal or lateralizing neurologic deficits.  PSYCH: Patients affect is appropriate, there is no evidence of anxiety or depression.  SKIN: warm  DATA REVIEW  ECG:NSR  ECHO: 09/24/21: LVEF 30-35%, RV function mildly reduced, severe posterior MR 8/23: LVEF 30-35%, LVID 7.2cm w/ grade III DD, moderate to severe MR. Severely dilated LA.  09/03/17: LVEF 60%-65%.  TEE 2/24: severe eccentric MR, moderately reduced LV function.   CATH: 06/19/21: 40 to 50% proximal right coronary narrowing. Right dominant anatomy Coronary arteries otherwise normal Significant elevation LVEDP 30 mmHg consistent with acute on chronic combined systolic and diastolic heart failure Mild pulmonary hypertension, mean pressure 30 mmHg.  WHO group II etiology based on hemodysnamics.. Capillary wedge mean 24 mmHg.  V wave to 40 mmHg suggesting some degree of mitral regurgitation. Cardiac output 8.5 L/min with index 3.57 L/min/m. Pulmonary resistance 0.7 Wood units.  Len[place] 03/08/22: HEMODYNAMICS: RA:                  15 mmHg (mean) RV:                  72/7-17 mmHg PA:                  74/28 mmHg (43 mean) PCWP:            27 mmHg at the Left PA; unable to wedge right PA despite multiple attempts; V waves 22mHg on right PA.                                       Estimated Fick CO/CI   5.2  L/min, 2.15 L/min/m2                                            TPG                 16  mmHg                                            PVR                 3.1 Wood Units  PAPi                3       IMPRESSION: Severely elevated pre and post capillary filling pressures.  Mild to moderately elevated PA mean and PVR secondary to combined pre and post  capillary pulmonary hypertension Moderately reduced cardiac index.    ASSESSMENT & PLAN:  Heart failure with reduced EF Etiology of [id number], LHC in 2023 with nonobstructive CAD. CMR pending. Possibly valvular exacerbated by stress induced cardiomyopathy NYHA class / AHA Stage:NYHA IIB; continued improvement in functional status; no longer using a wheelchair.  Volume status & Diuretics: Hypervolemic, taking lasix 80 every morning and 61m at night. Will continue this for another week with plan to return to 43mBID afterwards.  Vasodilators:losartan 12.1m53mlimited by SBP.  Beta-Blocker:Toprol 21m47m MRA:[id number] to 21mg67mly. Repeat labs today.  Cardiometabolic:start jardiance 10mg [id number] Devices therapies & Valvulopathies: ICD not indicated yet, will start to uptitrate GDMT. Severe eccentric MR 2/2 poor leaflet coaptation. Will discuss with structural cardiology today.  Advanced therapies:Currently not a candidate to to recency of LVEF reduction, morbid obesity and significant co-morbidities. She is no longer using a wheelchair and now ambulating after diuresis and starting GDMT. LVEF 35%.   2. Severe MR - Severe eccentric posteriorly directed MR - TEE confirming severe functional MR. Will discuss possibility for TEER with structural.  3. CAD - LHC in 5/23 w/ nonobstructive CAD - Followed by general cardiology  4. Hx of PE/DVT w/ IVC filter -Submassive saddle PE extending from the distal main pulmonary artery and to the left and right pulmonary arteries with extension into the left upper lobe, left lower lobe and lingula branches and right upper lobe, right middle lobe and right lower lobes on 04/27/2013  - [facility] (Lupus inhibitor, anticardiolipin antibodies, and anti beta-2 glycoprotein antibodies were negative; protein C activity 85%, protein S activity 76%, factor V Leiden, and prothrombin gene mutation ) - IVC  filter in place; could not be removed due to significant thrombus burden [facility] to severity of thrombus burden and weight >250lbs.  - RHC to evaluate for CTEPH.   5. Morbid obesity  - BMI 43.5 today.   [name] [name] Advanced Heart Failure Mechanical Circulatory Support

## 2022-03-11 NOTE — Telephone Encounter (Signed)
I received a fax from [facility] indicating that there were gaps in refills in the patient's spironolactone, furosemide, and atorvastatin, as well as duplicate rx for metoprolol 72m and 227mdaily. I had not seen patient since 10/2021. Will route to ElMcGregorNP,  to review most recent med list to ensure that the recent OV is the most up to date list and if so, please route to nursing staff to reach out to patient to ensure she has the refills she needs. Thanks!

## 2022-03-12 ENCOUNTER — Other Ambulatory Visit (HOSPITAL_COMMUNITY)
Admission: RE | Admit: 2022-03-12 | Discharge: 2022-03-12 | Disposition: A | Discharge status: Home / Self Care | Payer: Medicare HMO | Source: Ambulatory Visit | Attending: Nurse Practitioner | Admitting: Nurse Practitioner

## 2022-03-12 ENCOUNTER — Encounter (HOSPITAL_COMMUNITY)
Admission: RE | Admit: 2022-03-12 | Discharge: 2022-03-12 | Disposition: A | Discharge status: Home / Self Care | Payer: Medicare HMO | Source: Ambulatory Visit | Attending: Cardiology | Admitting: Cardiology

## 2022-03-12 VITALS — BP 98/64 | HR 65 | Ht 71.0 in | Wt 264.8 lb

## 2022-03-12 DIAGNOSIS — I10 Essential (primary) hypertension: Secondary | ICD-10-CM | POA: Insufficient documentation

## 2022-03-12 DIAGNOSIS — Z79899 Other long term (current) drug therapy: Secondary | ICD-10-CM | POA: Insufficient documentation

## 2022-03-12 DIAGNOSIS — I5022 Chronic systolic (congestive) heart failure: Secondary | ICD-10-CM

## 2022-03-12 DIAGNOSIS — D61818 Other pancytopenia: Secondary | ICD-10-CM | POA: Insufficient documentation

## 2022-03-12 DIAGNOSIS — I5042 Chronic combined systolic (congestive) and diastolic (congestive) heart failure: Secondary | ICD-10-CM | POA: Insufficient documentation

## 2022-03-12 LAB — BASIC METABOLIC PANEL
Anion gap: 10 (ref 5–15)
BUN: 20 mg/dL (ref 6–20)
CO2: 29 mmol/L (ref 22–32)
Calcium: 8.7 mg/dL — ABNORMAL LOW (ref 8.9–10.3)
Chloride: 99 mmol/L (ref 98–111)
Creatinine, Ser: 1.03 mg/dL — ABNORMAL HIGH (ref 0.44–1.00)
GFR, Estimated: >60 mL/min (ref >60)
Glucose, Bld: 123 mg/dL — ABNORMAL HIGH (ref 70–99)
Potassium: 3.8 mmol/L (ref 3.5–5.1)
Sodium: 138 mmol/L (ref 135–145)

## 2022-03-12 LAB — CBC
HCT: 36.1 % (ref 36.0–46.0)
Hemoglobin: 10.3 g/dL — ABNORMAL LOW (ref 12.0–15.0)
MCH: 22.6 pg — ABNORMAL LOW (ref 26.0–34.0)
MCHC: 28.5 g/dL — ABNORMAL LOW (ref 30.0–36.0)
MCV: 79.3 fL — ABNORMAL LOW (ref 80.0–100.0)
Platelets: 256 10*3/uL (ref 150–400)
RBC: 4.55 MIL/uL (ref 3.87–5.11)
RDW: 21.4 % — ABNORMAL HIGH (ref 11.5–15.5)
WBC: 6 10*3/uL (ref 4.0–10.5)
nRBC: 0 % (ref 0.0–0.2)

## 2022-03-12 LAB — ECHO TEE
MV M vel: 4.12 m/s
MV Peak grad: 67.9 mmHg
Radius: 0.7 cm

## 2022-03-12 LAB — GLUCOSE, CAPILLARY
Glucose-Capillary: 100 mg/dL — ABNORMAL HIGH (ref 70–99)
Glucose-Capillary: 75 mg/dL (ref 70–99)

## 2022-03-12 NOTE — Progress Notes (Signed)
Pulmonary Individual Treatment Plan  Patient Details  Name: [name] [name] MRN: [id number] Date of Birth: [birth year] Referring Provider:   Flowsheet Row PULMONARY REHAB OTHER RESP ORIENTATION from 03/12/2022 in [place]  Referring Provider Dr. Daniel Nones       Initial Encounter Date:  Flowsheet Row PULMONARY REHAB OTHER RESP ORIENTATION from 03/12/2022 in [place]  Date 03/12/22       Visit Diagnosis: Chronic combined systolic and diastolic CHF (congestive heart failure) (HCC)  Patient's Home Medications on Admission:   Current Outpatient Medications:    albuterol (VENTOLIN HFA) 108 (90 Base) MCG/ACT inhaler, Inhale 2 puffs into the lungs every 2 (two) hours as needed for wheezing or shortness of breath., Disp: 18 g, Rfl: 11   atorvastatin (LIPITOR) 20 MG tablet, Take 1 tablet (20 mg total) by mouth daily., Disp: 90 tablet, Rfl: 3   empagliflozin (JARDIANCE) 10 MG TABS tablet, Take 1 tablet (10 mg total) by mouth daily before breakfast., Disp: 90 tablet, Rfl: 3   furosemide (LASIX) 40 MG tablet, Take 1 tablet (40 mg total) by mouth 2 (two) times daily. (Patient taking differently: Take 80 mg by mouth 2 (two) times daily.), Disp: 180 tablet, Rfl: 3   losartan (COZAAR) 25 MG tablet, Take 0.5 tablets (12.5 mg total) by mouth daily., Disp: 90 tablet, Rfl: 3   Magnesium Oxide 400 MG CAPS, Take 1 capsule (400 mg total) by mouth daily., Disp: 90 capsule, Rfl: 3   metoprolol succinate (TOPROL-XL) 25 MG 24 hr tablet, Take 1 tablet (25 mg total) by mouth daily. Take with or immediately following a meal., Disp: 90 tablet, Rfl: 3   midodrine (PROAMATINE) 2.5 MG tablet, Take 1 tablet (2.5 mg total) by mouth 2 (two) times daily with a meal., Disp: 60 tablet, Rfl: 6   potassium chloride (KLOR-CON) 10 MEQ tablet, Take 2 tablets (20 mEq total) by mouth 2 (two) times daily., Disp: 180 tablet, Rfl: 3   spironolactone (ALDACTONE) 25 MG tablet, Take 1 tablet  (25 mg total) by mouth daily., Disp: 90 tablet, Rfl: 3   warfarin (COUMADIN) 10 MG tablet, Take 10 mg by mouth every Monday. In the evening, Disp: , Rfl:    warfarin (COUMADIN) 5 MG tablet, TAKE (1) OR (1) 1/2 TABLET BY MOUTH DAILY AS DIRECTED. (Patient taking differently: Take 5-10 mg by mouth See admin instructions. Take 5 mg every evening  EXCEPT: Monday take 10 mg (Tablet)), Disp: , Rfl:  No current facility-administered medications for this encounter.  Facility-Administered Medications Ordered in Other Encounters:    0.9 %  sodium chloride infusion, , Intravenous, Continuous, Kefalas, Manon Hilding, PA-C, Last Rate: 300 mL/hr at 03/08/22 [id number], New Bag at 03/08/22 [id number]  Past Medical History: Past Medical History:  Diagnosis Date   Anemia    Blood transfusion without reported diagnosis    CAD (coronary artery disease)    Nonobstructive   Closed left ankle fracture    August 30 2012   Clotting disorder [facility])    DVT (deep venous thrombosis) (HCC)    L leg   Fibroid tumor    HFrEF (heart failure with reduced ejection fraction) (HCC)    Hidradenitis    Hypertension    Mitral regurgitation    NICM (nonischemic cardiomyopathy) (HCC)    NSVT (nonsustained ventricular tachycardia) (HCC)    Obesity    OSA on CPAP    Pulmonary embolism ([place]) 04/2013   Sleep apnea     Tobacco Use: Social  History   Tobacco Use  Smoking Status Every Day   Years: 18.00   Types: Cigarettes  Smokeless Tobacco Never  Tobacco Comments   Smokes 2-3 cigarettes daily as of 02/13/22    Labs: Review Flowsheet  More data exists      Latest Ref Rng & Units 06/16/2021 06/19/2021 08/21/2021 11/14/2021 03/08/2022  Labs for ITP Cardiac and Pulmonary Rehab  Cholestrol 0 - 200 mg/dL - - 86  77  -  LDL (calc) 0 - 99 mg/dL - - 49  43  -  HDL-C >40 mg/dL - - 25  27  -  Trlycerides <150 mg/dL 52  - 59  35  -  PH, Arterial 7.35 - 7.45 - 7.433  - - -  PCO2 arterial 32 - 48 mmHg - 41.7  - - -  Bicarbonate 20.0 - 28.0  mmol/L 20.0 - 28.0 mmol/L - 27.9  29.5  30.8  30.2  - - 28.9  29.2   TCO2 22 - 32 mmol/L 22 - 32 mmol/L - 29  31  32  32  - - 31  31   O2 Saturation % % - 96  59  60  64  - - 44  45     Capillary Blood Glucose: Lab Results  Component Value Date   GLUCAP 100 (H) 03/12/2022   GLUCAP 75 03/12/2022   GLUCAP 133 (H) 06/14/2021   GLUCAP 92 06/14/2021   GLUCAP 99 06/14/2021    POCT Glucose     Row Name 03/12/22 0953             POCT Blood Glucose   Pre-Exercise 100 mg/dL                Pulmonary Assessment Scores:  Pulmonary Assessment Scores     Row Name 03/12/22 0830         ADL UCSD   ADL Phase Entry     SOB Score total 60     Rest 0     Walk 2     Stairs 3     Bath 2     Dress 3     Shop 3       CAT Score   CAT Score 18       mMRC Score   mMRC Score 3             UCSD: Self-administered rating of dyspnea associated with activities of daily living (ADLs) 6-point scale (0 = "not at all" to 5 = "maximal or unable to do because of breathlessness")  Scoring Scores range from 0 to 120.  Minimally important difference is 5 units  CAT: CAT can identify the health impairment of COPD patients and is better correlated with disease progression.  CAT has a scoring range of zero to 40. The CAT score is classified into four groups of low (less than 10), medium (10 - 20), high (21-30) and very high (31-40) based on the impact level of disease on health status. A CAT score over 10 suggests significant symptoms.  A worsening CAT score could be explained by an exacerbation, poor medication adherence, poor inhaler technique, or progression of COPD or comorbid conditions.  CAT MCID is 2 points  mMRC: mMRC (Modified Medical Research Council) Dyspnea Scale is used to assess the degree of baseline functional disability in patients of respiratory disease due to dyspnea. No minimal important difference is established. A decrease in score of 1 point or greater is  considered a positive change.   Pulmonary Function Assessment:   Exercise Target Goals: Exercise Program Goal: Individual exercise prescription set using results from initial 6 min walk test and THRR while considering  patient's activity barriers and safety.   Exercise Prescription Goal: Initial exercise prescription builds to 30-45 minutes a day of aerobic activity, 2-3 days per week.  Home exercise guidelines will be given to patient during program as part of exercise prescription that the participant will acknowledge.  Activity Barriers & Risk Stratification:  Activity Barriers & Cardiac Risk Stratification - 03/12/22 0832       Activity Barriers & Cardiac Risk Stratification   Activity Barriers Back Problems;Deconditioning;Shortness of Breath;Decreased Ventricular Function;Balance Concerns;History of Falls    Cardiac Risk Stratification High             6 Minute Walk:  6 Minute Walk     Row Name 03/12/22 0954         6 Minute Walk   Distance 800 feet     Walk Time 6 minutes     # of Rest Breaks 2     MPH 1.52     METS 2.6     RPE 11     Perceived Dyspnea  13     VO2 Peak 9.11     Symptoms Yes (comment)     Comments 2 standing rest breaks for 30 sec each due to SOB     Resting HR 65 bpm     Resting BP 98/64     Resting Oxygen Saturation  100 %     Exercise Oxygen Saturation  during 6 min walk 96 %     Max Ex. HR 91 bpm     Max Ex. BP 108/72     2 Minute Post BP 96/70       Interval HR   1 Minute HR 82     2 Minute HR 88     3 Minute HR 89     4 Minute HR 90     5 Minute HR 85     6 Minute HR 91     2 Minute Post HR 84     Interval Heart Rate? Yes       Interval Oxygen   Interval Oxygen? Yes     Baseline Oxygen Saturation % 100 %     1 Minute Oxygen Saturation % 96 %     1 Minute Liters of Oxygen 0 L     2 Minute Oxygen Saturation % 97 %     2 Minute Liters of Oxygen 0 L     3 Minute Oxygen Saturation % 100 %     3 Minute Liters of Oxygen 0 L      4 Minute Oxygen Saturation % 99 %     4 Minute Liters of Oxygen 0 L     5 Minute Oxygen Saturation % 100 %     5 Minute Liters of Oxygen 0 L     6 Minute Oxygen Saturation % 100 %     6 Minute Liters of Oxygen 0 L     2 Minute Post Oxygen Saturation % 98 %     2 Minute Post Liters of Oxygen 0 L              Oxygen Initial Assessment:  Oxygen Initial Assessment - 03/12/22 1008       Home Oxygen   Home Oxygen Device None  Sleep Oxygen Prescription CPAP    Home Exercise Oxygen Prescription None    Home Resting Oxygen Prescription None    Compliance with Home Oxygen Use Yes      Initial 6 min Walk   Oxygen Used None      Program Oxygen Prescription   Program Oxygen Prescription None      Intervention   Short Term Goals To learn and understand importance of monitoring SPO2 with pulse oximeter and demonstrate accurate use of the pulse oximeter.;To learn and understand importance of maintaining oxygen saturations>88%;To learn and demonstrate proper pursed lip breathing techniques or other breathing techniques.     Long  Term Goals Verbalizes importance of monitoring SPO2 with pulse oximeter and return demonstration;Maintenance of O2 saturations>88%;Exhibits proper breathing techniques, such as pursed lip breathing or other method taught during program session;Compliance with respiratory medication             Oxygen Re-Evaluation:   Oxygen Discharge (Final Oxygen Re-Evaluation):   Initial Exercise Prescription:  Initial Exercise Prescription - 03/12/22 0900       Date of Initial Exercise RX and Referring Provider   Date 03/12/22    Referring Provider Dr. Daniel Nones    Expected Discharge Date 07/18/22      NuStep   Level 1    SPM 80    Minutes 39      Prescription Details   Frequency (times per week) 2    Duration Progress to 30 minutes of continuous aerobic without signs/symptoms of physical distress      Intensity   THRR 40-80% of Max Heartrate 70-140     Ratings of Perceived Exertion 11-13    Perceived Dyspnea 0-4      Resistance Training   Training Prescription Yes    Weight 3    Reps 10-15             Perform Capillary Blood Glucose checks as needed.  Exercise Prescription Changes:   Exercise Comments:   Exercise Goals and Review:   Exercise Goals     Row Name 03/12/22 0958             Exercise Goals   Increase Physical Activity Yes       Intervention Provide advice, education, support and counseling about physical activity/exercise needs.;Develop an individualized exercise prescription for aerobic and resistive training based on initial evaluation findings, risk stratification, comorbidities and participant's personal goals.       Expected Outcomes Short Term: Attend rehab on a regular basis to increase amount of physical activity.;Long Term: Add in home exercise to make exercise part of routine and to increase amount of physical activity.;Long Term: Exercising regularly at least 3-5 days a week.       Increase Strength and Stamina Yes       Intervention Provide advice, education, support and counseling about physical activity/exercise needs.;Develop an individualized exercise prescription for aerobic and resistive training based on initial evaluation findings, risk stratification, comorbidities and participant's personal goals.       Expected Outcomes Short Term: Increase workloads from initial exercise prescription for resistance, speed, and METs.;Short Term: Perform resistance training exercises routinely during rehab and add in resistance training at home;Long Term: Improve cardiorespiratory fitness, muscular endurance and strength as measured by increased METs and functional capacity (6MWT)       Able to understand and use rate of perceived exertion (RPE) scale Yes       Intervention Provide education and explanation on how to use  RPE scale       Expected Outcomes Short Term: Able to use RPE daily in rehab to  express subjective intensity level;Long Term:  Able to use RPE to guide intensity level when exercising independently       Able to understand and use Dyspnea scale Yes       Intervention Provide education and explanation on how to use Dyspnea scale       Expected Outcomes Short Term: Able to use Dyspnea scale daily in rehab to express subjective sense of shortness of breath during exertion;Long Term: Able to use Dyspnea scale to guide intensity level when exercising independently       Knowledge and understanding of Target Heart Rate Range (THRR) Yes       Intervention Provide education and explanation of THRR including how the numbers were predicted and where they are located for reference       Expected Outcomes Short Term: Able to state/look up THRR;Short Term: Able to use daily as guideline for intensity in rehab;Long Term: Able to use THRR to govern intensity when exercising independently       Understanding of Exercise Prescription Yes       Intervention Provide education, explanation, and written materials on patient's individual exercise prescription       Expected Outcomes Short Term: Able to explain program exercise prescription;Long Term: Able to explain home exercise prescription to exercise independently                Exercise Goals Re-Evaluation :   Discharge Exercise Prescription (Final Exercise Prescription Changes):   Nutrition:  Target Goals: Understanding of nutrition guidelines, daily intake of sodium <1529m, cholesterol <2020m calories 30% from fat and 7% or less from saturated fats, daily to have 5 or more servings of fruits and vegetables.  Biometrics:  Pre Biometrics - 03/12/22 0959       Pre Biometrics   Height 5' 11"$  (1.803 m)    Weight 264 lb 12.4 oz (120.1 kg)    Waist Circumference 46.5 inches    Hip Circumference 51 inches    Waist to Hip Ratio 0.91 %    BMI (Calculated) 36.94    Triceps Skinfold 26 mm    % Body Fat 44.5 %    Grip Strength 32.5  kg    Flexibility 0 in    Single Leg Stand 60 seconds              Nutrition Therapy Plan and Nutrition Goals:  Nutrition Therapy & Goals - 03/12/22 0840       Personal Nutrition Goals   Comments Pt states that she follows a HH diet.      Intervention Plan   Intervention Nutrition handout(s) given to patient.    Expected Outcomes Short Term Goal: Understand basic principles of dietary content, such as calories, fat, sodium, cholesterol and nutrients.             Nutrition Assessments:  Nutrition Assessments - 03/12/22 0841       MEDFICTS Scores   Pre Score 57            MEDIFICTS Score Key: ?70 Need to make dietary changes  40-70 Heart Healthy Diet ? 40 Therapeutic Level Cholesterol Diet   Picture Your Plate Scores: <[id number] dietary pattern with much room for improvement. 41-50 Dietary pattern unlikely to meet recommendations for good health and room for improvement. 51-60 More healthful dietary pattern, with some room for improvement.  >60 Healthy  dietary pattern, although there may be some specific behaviors that could be improved.    Nutrition Goals Re-Evaluation:   Nutrition Goals Discharge (Final Nutrition Goals Re-Evaluation):   Psychosocial: Target Goals: Acknowledge presence or absence of significant depression and/or stress, maximize coping skills, provide positive support system. Participant is able to verbalize types and ability to use techniques and skills needed for reducing stress and depression.  Initial Review & Psychosocial Screening:  Initial Psych Review & Screening - 03/12/22 0835       Initial Review   Current issues with None Identified      Family Dynamics   Good Support System? Yes    Comments She lists her parents, daughter, and fiance as her support system.      Barriers   Psychosocial barriers to participate in program There are no identifiable barriers or psychosocial needs.;The patient should benefit from  training in stress management and relaxation.      Screening Interventions   Interventions Encouraged to exercise    Expected Outcomes Long Term goal: The participant improves quality of Life and PHQ9 Scores as seen by post scores and/or verbalization of changes;Short Term goal: Identification and review with participant of any Quality of Life or Depression concerns found by scoring the questionnaire.             Quality of Life Scores:  Quality of Life - 03/12/22 0856       Quality of Life   Select Quality of Life      Quality of Life Scores   Health/Function Pre 20.57 %    Socioeconomic Pre 22.2 %    Psych/Spiritual Pre 26.36 %    Family Pre 25.5 %    GLOBAL Pre 22.86 %            Scores of 19 and below usually indicate a poorer quality of life in these areas.  A difference of  2-3 points is a clinically meaningful difference.  A difference of 2-3 points in the total score of the Quality of Life Index has been associated with significant improvement in overall quality of life, self-image, physical symptoms, and general health in studies assessing change in quality of life.   PHQ-9: Review Flowsheet  More data exists      03/12/2022 01/24/2022 03/25/2018 03/11/2018 01/28/2018  Depression screen PHQ 2/9  Decreased Interest 1 0 0 0 0  Down, Depressed, Hopeless 0 0 0 0 0  PHQ - 2 Score 1 0 0 0 0  Altered sleeping 0 - - - -  Tired, decreased energy 1 - - - -  Change in appetite 0 - - - -  Feeling bad or failure about yourself  0 - - - -  Trouble concentrating 0 - - - -  Moving slowly or fidgety/restless 0 - - - -  Suicidal thoughts 0 - - - -  PHQ-9 Score 2 - - - -  Difficult doing work/chores Somewhat difficult - - - -   Interpretation of Total Score  Total Score Depression Severity:  1-4 = Minimal depression, 5-9 = Mild depression, 10-14 = Moderate depression, 15-19 = Moderately severe depression, 20-27 = Severe depression   Psychosocial Evaluation and Intervention:   Psychosocial Evaluation - 03/12/22 0959       Psychosocial Evaluation & Interventions   Interventions Stress management education;Relaxation education;Encouraged to exercise with the program and follow exercise prescription    Comments Pt has no barriers to participate in PR. She has no identifiable  psychosocial issues. She scored a 2 on her PHQ-9. She states that she can feel when her iron levels are getting low, so when this happens, she does not feel like doing anything. She receives routine iron infusions. She also feels like she has little energy sometimes, but this is related to her heart condition. She states that she has a good support system with her parents, daughter, and fiance. She has a 56 year old daughter, and she currently does not have the energy to keep up with her. She is currently smoking 3-4 cigarettes per day. This is down from a pack per day, and she plans to continue to wean herself down until she stops completely. She has tried nicotine gum in the past, but she reports that it did not help. Her goals while in the program are to decrease her SOB with exertion and to be able to keep up with her daughter better. She would like to play with her daughter in the park without becoming SOB. During her recent [place] and TEE, it was found that she is having signiciant mitral regurgitation. The cardiology team will be meeting about her case later this week to determine their next course of action. She states that they have told her they may need to go in to repair or replace the valve. I explained to her that if this is the case, we would likely switch her over to the cardiac program once she recovers. She is eager to start the program.    Expected Outcomes Pt will continue to have no psychosocial issues identified.    Continue Psychosocial Services  No Follow up required             Psychosocial Re-Evaluation:   Psychosocial Discharge (Final Psychosocial  Re-Evaluation):    Education: Education Goals: Education classes will be provided on a weekly basis, covering required topics. Participant will state understanding/return demonstration of topics presented.  Learning Barriers/Preferences:  Learning Barriers/Preferences - 03/12/22 0844       Learning Barriers/Preferences   Learning Barriers None    Learning Preferences Written Material             Education Topics: How Lungs Work and Diseases: - Discuss the anatomy of the lungs and diseases that can affect the lungs, such as COPD.   Exercise: -Discuss the importance of exercise, FITT principles of exercise, normal and abnormal responses to exercise, and how to exercise safely.   Environmental Irritants: -Discuss types of environmental irritants and how to limit exposure to environmental irritants.   Meds/Inhalers and oxygen: - Discuss respiratory medications, definition of an inhaler and oxygen, and the proper way to use an inhaler and oxygen.   Energy Saving Techniques: - Discuss methods to conserve energy and decrease shortness of breath when performing activities of daily living.    Bronchial Hygiene / Breathing Techniques: - Discuss breathing mechanics, pursed-lip breathing technique,  proper posture, effective ways to clear airways, and other functional breathing techniques   Cleaning Equipment: - Provides group verbal and written instruction about the health risks of elevated stress, cause of high stress, and healthy ways to reduce stress.   Nutrition I: Fats: - Discuss the types of cholesterol, what cholesterol does to the body, and how cholesterol levels can be controlled.   Nutrition II: Labels: -Discuss the different components of food labels and how to read food labels.   Respiratory Infections: - Discuss the signs and symptoms of respiratory infections, ways to prevent respiratory infections, and the  importance of seeking medical treatment when  having a respiratory infection.   Stress I: Signs and Symptoms: - Discuss the causes of stress, how stress may lead to anxiety and depression, and ways to limit stress.   Stress II: Relaxation: -Discuss relaxation techniques to limit stress.   Oxygen for Home/Travel: - Discuss how to prepare for travel when on oxygen and proper ways to transport and store oxygen to ensure safety.   Knowledge Questionnaire Score:  Knowledge Questionnaire Score - 03/12/22 0844       Knowledge Questionnaire Score   Pre Score 14/18             Core Components/Risk Factors/Patient Goals at Admission:  Personal Goals and Risk Factors at Admission - 03/12/22 1005       Core Components/Risk Factors/Patient Goals on Admission   Tobacco Cessation Yes    Number of packs per day 2-4 cigs per day    Intervention Assist the participant in steps to quit. Provide individualized education and counseling about committing to Tobacco Cessation, relapse prevention, and pharmacological support that can be provided by physician.;Advice worker, assist with locating and accessing local/national Quit Smoking programs, and support quit date choice.    Expected Outcomes Short Term: Will demonstrate readiness to quit, by selecting a quit date.;Short Term: Will quit all tobacco product use, adhering to prevention of relapse plan.;Long Term: Complete abstinence from all tobacco products for at least 12 months from quit date.    Improve shortness of breath with ADL's Yes    Intervention Provide education, individualized exercise plan and daily activity instruction to help decrease symptoms of SOB with activities of daily living.    Expected Outcomes Short Term: Improve cardiorespiratory fitness to achieve a reduction of symptoms when performing ADLs;Long Term: Be able to perform more ADLs without symptoms or delay the onset of symptoms    Heart Failure Yes    Intervention Provide a combined exercise and  nutrition program that is supplemented with education, support and counseling about heart failure. Directed toward relieving symptoms such as shortness of breath, decreased exercise tolerance, and extremity edema.    Expected Outcomes Short term: Attendance in program 2-3 days a week with increased exercise capacity. Reported lower sodium intake. Reported increased fruit and vegetable intake. Reports medication compliance.;Improve functional capacity of life;Short term: Daily weights obtained and reported for increase. Utilizing diuretic protocols set by physician.;Long term: Adoption of self-care skills and reduction of barriers for early signs and symptoms recognition and intervention leading to self-care maintenance.    Hypertension Yes    Intervention Provide education on lifestyle modifcations including regular physical activity/exercise, weight management, moderate sodium restriction and increased consumption of fresh fruit, vegetables, and low fat dairy, alcohol moderation, and smoking cessation.;Monitor prescription use compliance.    Expected Outcomes Short Term: Continued assessment and intervention until BP is < 140/106m HG in hypertensive participants. < 130/819mHG in hypertensive participants with diabetes, heart failure or chronic kidney disease.;Long Term: Maintenance of blood pressure at goal levels.    Personal Goal Other Yes    Personal Goal To be able to be more active with her 7 58ear old daughter and to play in the park with her without worrying about being too SOB.    Intervention Attend PR two days per week and begin a home exercise program.    Expected Outcomes Pt will meet stated goals.             Core Components/Risk Factors/Patient Goals Review:  Core Components/Risk Factors/Patient Goals at Discharge (Final Review):    ITP Comments:   Comments: Patient arrived for 1st visit/orientation/education at 0800. Patient was referred to PR by Dr. Daniel Nones due to  chronic combined systolic and diastolic CHF (123456). During orientation advised patient on arrival and appointment times what to wear, what to do before, during and after exercise. Reviewed attendance and class policy.  Pt is scheduled to return Pulmonary Rehab on 03/19/2022 at 1045. Pt was advised to come to class 15 minutes before class starts.  Discussed RPE/Dpysnea scales. Patient participated in warm up stretches. Patient was able to complete 6 minute walk test.  Patient was measured for the equipment. Discussed equipment safety with patient. Took patient pre-anthropometric measurements. Patient finished visit at 0930.

## 2022-03-13 ENCOUNTER — Other Ambulatory Visit: Payer: Self-pay | Admitting: Cardiology

## 2022-03-13 NOTE — Telephone Encounter (Signed)
Request for warfarin refill:  Last INR was 2.0 on 02/13/22 (1.8 on [facility]) Past due for INR.  Been in hospital LOV was 03/11/22  Refill approved x 1 Scheduler to call and make INR appt.

## 2022-03-14 ENCOUNTER — Ambulatory Visit (HOSPITAL_COMMUNITY): Payer: Medicare HMO | Attending: Cardiovascular Disease

## 2022-03-14 DIAGNOSIS — I2510 Atherosclerotic heart disease of native coronary artery without angina pectoris: Secondary | ICD-10-CM | POA: Diagnosis not present

## 2022-03-14 DIAGNOSIS — Z86718 Personal history of other venous thrombosis and embolism: Secondary | ICD-10-CM | POA: Insufficient documentation

## 2022-03-14 DIAGNOSIS — R69 Illness, unspecified: Secondary | ICD-10-CM | POA: Diagnosis not present

## 2022-03-14 DIAGNOSIS — F17200 Nicotine dependence, unspecified, uncomplicated: Secondary | ICD-10-CM | POA: Diagnosis not present

## 2022-03-14 DIAGNOSIS — I110 Hypertensive heart disease with heart failure: Secondary | ICD-10-CM | POA: Insufficient documentation

## 2022-03-14 DIAGNOSIS — R55 Syncope and collapse: Secondary | ICD-10-CM | POA: Diagnosis not present

## 2022-03-14 DIAGNOSIS — Z86711 Personal history of pulmonary embolism: Secondary | ICD-10-CM | POA: Diagnosis not present

## 2022-03-14 DIAGNOSIS — G4730 Sleep apnea, unspecified: Secondary | ICD-10-CM | POA: Insufficient documentation

## 2022-03-14 DIAGNOSIS — I340 Nonrheumatic mitral (valve) insufficiency: Secondary | ICD-10-CM | POA: Insufficient documentation

## 2022-03-14 DIAGNOSIS — I428 Other cardiomyopathies: Secondary | ICD-10-CM | POA: Diagnosis not present

## 2022-03-14 DIAGNOSIS — I509 Heart failure, unspecified: Secondary | ICD-10-CM | POA: Insufficient documentation

## 2022-03-14 DIAGNOSIS — E669 Obesity, unspecified: Secondary | ICD-10-CM | POA: Insufficient documentation

## 2022-03-14 DIAGNOSIS — I4729 Other ventricular tachycardia: Secondary | ICD-10-CM | POA: Diagnosis not present

## 2022-03-14 LAB — ECHOCARDIOGRAM COMPLETE
Area-P 1/2: 4.93 cm2
MV M vel: 4.7 m/s
MV Peak grad: 88.4 mmHg
Radius: 0.8 cm
S' Lateral: 5.5 cm

## 2022-03-15 ENCOUNTER — Other Ambulatory Visit: Payer: Self-pay | Admitting: Cardiology

## 2022-03-15 DIAGNOSIS — Z86718 Personal history of other venous thrombosis and embolism: Secondary | ICD-10-CM

## 2022-03-15 MED ORDER — WARFARIN SODIUM 10 MG PO TABS: ORAL_TABLET | ORAL | 0 refills | Status: DC

## 2022-03-15 NOTE — Telephone Encounter (Signed)
Prescription refill request received for warfarin Lov: 03/11/22 ([name])  Next INR check: 02/13/22 Warfarin tablet strength: '10mg'$   Called pt and made her aware that INR is overdue and appt is required for Warfarin refill. Scheduled appt with [facility] on 03/20/22. Refill sent.

## 2022-03-19 ENCOUNTER — Encounter (HOSPITAL_COMMUNITY): Payer: Medicare HMO

## 2022-03-20 ENCOUNTER — Ambulatory Visit: Payer: Medicare HMO | Attending: Cardiology | Admitting: *Deleted

## 2022-03-20 DIAGNOSIS — Z86718 Personal history of other venous thrombosis and embolism: Secondary | ICD-10-CM | POA: Diagnosis not present

## 2022-03-20 DIAGNOSIS — Z5181 Encounter for therapeutic drug level monitoring: Secondary | ICD-10-CM | POA: Diagnosis not present

## 2022-03-20 DIAGNOSIS — Z86711 Personal history of pulmonary embolism: Secondary | ICD-10-CM

## 2022-03-20 LAB — POCT INR: INR: 1.4 — AB (ref 2.0–3.0)

## 2022-03-20 NOTE — Patient Instructions (Signed)
Take warfarin 1 tablet tonight and tomorrow night then resume 1/2 tablet daily except 1 tablet on Mondays Recheck in 2 wks

## 2022-03-20 NOTE — Progress Notes (Signed)
Pulmonary Individual Treatment Plan  Patient Details  Name: [name] [name] MRN: [id number] Date of Birth: Dec 04, 1975 Referring Provider:   Flowsheet Row PULMONARY REHAB OTHER RESP ORIENTATION from 03/12/2022 in [place]  Referring Provider Dr. Daniel Nones       Initial Encounter Date:  Flowsheet Row PULMONARY REHAB OTHER RESP ORIENTATION from 03/12/2022 in [place]  Date 03/12/22       Visit Diagnosis: Chronic combined systolic and diastolic CHF (congestive heart failure) (HCC)  Patient's Home Medications on Admission:   Current Outpatient Medications:    albuterol (VENTOLIN HFA) 108 (90 Base) MCG/ACT inhaler, Inhale 2 puffs into the lungs every 2 (two) hours as needed for wheezing or shortness of breath., Disp: 18 g, Rfl: 11   atorvastatin (LIPITOR) 20 MG tablet, Take 1 tablet (20 mg total) by mouth daily., Disp: 90 tablet, Rfl: 3   empagliflozin (JARDIANCE) 10 MG TABS tablet, Take 1 tablet (10 mg total) by mouth daily before breakfast., Disp: 90 tablet, Rfl: 3   furosemide (LASIX) 40 MG tablet, Take 1 tablet (40 mg total) by mouth 2 (two) times daily. (Patient taking differently: Take 80 mg by mouth 2 (two) times daily.), Disp: 180 tablet, Rfl: 3   losartan (COZAAR) 25 MG tablet, Take 0.5 tablets (12.5 mg total) by mouth daily., Disp: 90 tablet, Rfl: 3   Magnesium Oxide 400 MG CAPS, Take 1 capsule (400 mg total) by mouth daily., Disp: 90 capsule, Rfl: 3   metoprolol succinate (TOPROL-XL) 25 MG 24 hr tablet, Take 1 tablet (25 mg total) by mouth daily. Take with or immediately following a meal., Disp: 90 tablet, Rfl: 3   midodrine (PROAMATINE) 2.5 MG tablet, Take 1 tablet (2.5 mg total) by mouth 2 (two) times daily with a meal., Disp: 60 tablet, Rfl: 6   potassium chloride (KLOR-CON) 10 MEQ tablet, Take 2 tablets (20 mEq total) by mouth 2 (two) times daily., Disp: 180 tablet, Rfl: 3   spironolactone (ALDACTONE) 25 MG tablet, Take 1 tablet  (25 mg total) by mouth daily., Disp: 90 tablet, Rfl: 3   warfarin (COUMADIN) 5 MG tablet, TAKE (1) OR (1) 1/2 TABLET BY MOUTH DAILY AS DIRECTED. (Patient taking differently: Take 5-10 mg by mouth See admin instructions. Take 5 mg every evening  EXCEPT: Monday take 10 mg (Tablet)), Disp: , Rfl:    warfarin (COUMADIN) 10 MG tablet, Take 1/2 tablet to 1 tablet daily or as directed by coumadin clinic., Disp: 20 tablet, Rfl: 0 No current facility-administered medications for this encounter.  Facility-Administered Medications Ordered in Other Encounters:    0.9 %  sodium chloride infusion, , Intravenous, Continuous, Kefalas, Manon Hilding, PA-C, Last Rate: 300 mL/hr at 03/08/22 [id number], New Bag at 03/08/22 [id number]  Past Medical History: Past Medical History:  Diagnosis Date   Anemia    Blood transfusion without reported diagnosis    CAD (coronary artery disease)    Nonobstructive   Closed left ankle fracture    August 30 2012   Clotting disorder [facility])    DVT (deep venous thrombosis) (HCC)    L leg   Fibroid tumor    HFrEF (heart failure with reduced ejection fraction) (HCC)    Hidradenitis    Hypertension    Mitral regurgitation    NICM (nonischemic cardiomyopathy) (HCC)    NSVT (nonsustained ventricular tachycardia) (HCC)    Obesity    OSA on CPAP    Pulmonary embolism ([place]) 04/2013   Sleep apnea  Tobacco Use: Social History   Tobacco Use  Smoking Status Every Day   Years: 18.00   Types: Cigarettes  Smokeless Tobacco Never  Tobacco Comments   Smokes 2-3 cigarettes daily as of 02/13/22    Labs: Review Flowsheet  More data exists      Latest Ref Rng & Units 06/16/2021 06/19/2021 08/21/2021 11/14/2021 03/08/2022  Labs for ITP Cardiac and Pulmonary Rehab  Cholestrol 0 - 200 mg/dL - - 86  77  -  LDL (calc) 0 - 99 mg/dL - - 49  43  -  HDL-C >40 mg/dL - - 25  27  -  Trlycerides <150 mg/dL 52  - 59  35  -  PH, Arterial 7.35 - 7.45 - 7.433  - - -  PCO2 arterial 32 - 48 mmHg - 41.7  - - -   Bicarbonate 20.0 - 28.0 mmol/L 20.0 - 28.0 mmol/L - 27.9  29.5  30.8  30.2  - - 28.9  29.2   TCO2 22 - 32 mmol/L 22 - 32 mmol/L - 29  31  32  32  - - 31  31   O2 Saturation % % - 96  59  60  64  - - 44  45     Capillary Blood Glucose: Lab Results  Component Value Date   GLUCAP 100 (H) 03/12/2022   GLUCAP 75 03/12/2022   GLUCAP 133 (H) 06/14/2021   GLUCAP 92 06/14/2021   GLUCAP 99 06/14/2021    POCT Glucose     Row Name 03/12/22 0953             POCT Blood Glucose   Pre-Exercise 100 mg/dL                Pulmonary Assessment Scores:  Pulmonary Assessment Scores     Row Name 03/12/22 0830         ADL UCSD   ADL Phase Entry     SOB Score total 60     Rest 0     Walk 2     Stairs 3     Bath 2     Dress 3     Shop 3       CAT Score   CAT Score 18       mMRC Score   mMRC Score 3             UCSD: Self-administered rating of dyspnea associated with activities of daily living (ADLs) 6-point scale (0 = "not at all" to 5 = "maximal or unable to do because of breathlessness")  Scoring Scores range from 0 to 120.  Minimally important difference is 5 units  CAT: CAT can identify the health impairment of COPD patients and is better correlated with disease progression.  CAT has a scoring range of zero to 40. The CAT score is classified into four groups of low (less than 10), medium (10 - 20), high (21-30) and very high (31-40) based on the impact level of disease on health status. A CAT score over 10 suggests significant symptoms.  A worsening CAT score could be explained by an exacerbation, poor medication adherence, poor inhaler technique, or progression of COPD or comorbid conditions.  CAT MCID is 2 points  mMRC: mMRC (Modified Medical Research Council) Dyspnea Scale is used to assess the degree of baseline functional disability in patients of respiratory disease due to dyspnea. No minimal important difference is established. A decrease in score of 1  point  or greater is considered a positive change.   Pulmonary Function Assessment:   Exercise Target Goals: Exercise Program Goal: Individual exercise prescription set using results from initial 6 min walk test and THRR while considering  patient's activity barriers and safety.   Exercise Prescription Goal: Initial exercise prescription builds to 30-45 minutes a day of aerobic activity, 2-3 days per week.  Home exercise guidelines will be given to patient during program as part of exercise prescription that the participant will acknowledge.  Activity Barriers & Risk Stratification:  Activity Barriers & Cardiac Risk Stratification - 03/12/22 0832       Activity Barriers & Cardiac Risk Stratification   Activity Barriers Back Problems;Deconditioning;Shortness of Breath;Decreased Ventricular Function;Balance Concerns;History of Falls    Cardiac Risk Stratification High             6 Minute Walk:  6 Minute Walk     Row Name 03/12/22 0954         6 Minute Walk   Distance 800 feet     Walk Time 6 minutes     # of Rest Breaks 2     MPH 1.52     METS 2.6     RPE 11     Perceived Dyspnea  13     VO2 Peak 9.11     Symptoms Yes (comment)     Comments 2 standing rest breaks for 30 sec each due to SOB     Resting HR 65 bpm     Resting BP 98/64     Resting Oxygen Saturation  100 %     Exercise Oxygen Saturation  during 6 min walk 96 %     Max Ex. HR 91 bpm     Max Ex. BP 108/72     2 Minute Post BP 96/70       Interval HR   1 Minute HR 82     2 Minute HR 88     3 Minute HR 89     4 Minute HR 90     5 Minute HR 85     6 Minute HR 91     2 Minute Post HR 84     Interval Heart Rate? Yes       Interval Oxygen   Interval Oxygen? Yes     Baseline Oxygen Saturation % 100 %     1 Minute Oxygen Saturation % 96 %     1 Minute Liters of Oxygen 0 L     2 Minute Oxygen Saturation % 97 %     2 Minute Liters of Oxygen 0 L     3 Minute Oxygen Saturation % 100 %     3 Minute  Liters of Oxygen 0 L     4 Minute Oxygen Saturation % 99 %     4 Minute Liters of Oxygen 0 L     5 Minute Oxygen Saturation % 100 %     5 Minute Liters of Oxygen 0 L     6 Minute Oxygen Saturation % 100 %     6 Minute Liters of Oxygen 0 L     2 Minute Post Oxygen Saturation % 98 %     2 Minute Post Liters of Oxygen 0 L              Oxygen Initial Assessment:  Oxygen Initial Assessment - 03/12/22 1008       Home Oxygen   Home Oxygen Device  None    Sleep Oxygen Prescription CPAP    Home Exercise Oxygen Prescription None    Home Resting Oxygen Prescription None    Compliance with Home Oxygen Use Yes      Initial 6 min Walk   Oxygen Used None      Program Oxygen Prescription   Program Oxygen Prescription None      Intervention   Short Term Goals To learn and understand importance of monitoring SPO2 with pulse oximeter and demonstrate accurate use of the pulse oximeter.;To learn and understand importance of maintaining oxygen saturations>88%;To learn and demonstrate proper pursed lip breathing techniques or other breathing techniques.     Long  Term Goals Verbalizes importance of monitoring SPO2 with pulse oximeter and return demonstration;Maintenance of O2 saturations>88%;Exhibits proper breathing techniques, such as pursed lip breathing or other method taught during program session;Compliance with respiratory medication             Oxygen Re-Evaluation:  Oxygen Re-Evaluation     Row Name 03/19/22 1435             Program Oxygen Prescription   Program Oxygen Prescription None         Home Oxygen   Home Oxygen Device None       Sleep Oxygen Prescription CPAP       Home Exercise Oxygen Prescription None       Home Resting Oxygen Prescription None       Compliance with Home Oxygen Use Yes         Goals/Expected Outcomes   Short Term Goals To learn and understand importance of monitoring SPO2 with pulse oximeter and demonstrate accurate use of the pulse  oximeter.;To learn and understand importance of maintaining oxygen saturations>88%;To learn and demonstrate proper pursed lip breathing techniques or other breathing techniques.        Long  Term Goals Verbalizes importance of monitoring SPO2 with pulse oximeter and return demonstration;Maintenance of O2 saturations>88%;Exhibits proper breathing techniques, such as pursed lip breathing or other method taught during program session;Compliance with respiratory medication                Oxygen Discharge (Final Oxygen Re-Evaluation):  Oxygen Re-Evaluation - 03/19/22 1435       Program Oxygen Prescription   Program Oxygen Prescription None      Home Oxygen   Home Oxygen Device None    Sleep Oxygen Prescription CPAP    Home Exercise Oxygen Prescription None    Home Resting Oxygen Prescription None    Compliance with Home Oxygen Use Yes      Goals/Expected Outcomes   Short Term Goals To learn and understand importance of monitoring SPO2 with pulse oximeter and demonstrate accurate use of the pulse oximeter.;To learn and understand importance of maintaining oxygen saturations>88%;To learn and demonstrate proper pursed lip breathing techniques or other breathing techniques.     Long  Term Goals Verbalizes importance of monitoring SPO2 with pulse oximeter and return demonstration;Maintenance of O2 saturations>88%;Exhibits proper breathing techniques, such as pursed lip breathing or other method taught during program session;Compliance with respiratory medication             Initial Exercise Prescription:  Initial Exercise Prescription - 03/12/22 0900       Date of Initial Exercise RX and Referring Provider   Date 03/12/22    Referring Provider Dr. Daniel Nones    Expected Discharge Date 07/18/22      NuStep   Level 1    SPM  80    Minutes 39      Prescription Details   Frequency (times per week) 2    Duration Progress to 30 minutes of continuous aerobic without signs/symptoms of  physical distress      Intensity   THRR 40-80% of Max Heartrate 70-140    Ratings of Perceived Exertion 11-13    Perceived Dyspnea 0-4      Resistance Training   Training Prescription Yes    Weight 3    Reps 10-15             Perform Capillary Blood Glucose checks as needed.  Exercise Prescription Changes:   Exercise Comments:   Exercise Goals and Review:   Exercise Goals     Row Name 03/12/22 0958 03/19/22 1434           Exercise Goals   Increase Physical Activity Yes Yes      Intervention Provide advice, education, support and counseling about physical activity/exercise needs.;Develop an individualized exercise prescription for aerobic and resistive training based on initial evaluation findings, risk stratification, comorbidities and participant's personal goals. Provide advice, education, support and counseling about physical activity/exercise needs.;Develop an individualized exercise prescription for aerobic and resistive training based on initial evaluation findings, risk stratification, comorbidities and participant's personal goals.      Expected Outcomes Short Term: Attend rehab on a regular basis to increase amount of physical activity.;Long Term: Add in home exercise to make exercise part of routine and to increase amount of physical activity.;Long Term: Exercising regularly at least 3-5 days a week. Short Term: Attend rehab on a regular basis to increase amount of physical activity.;Long Term: Add in home exercise to make exercise part of routine and to increase amount of physical activity.;Long Term: Exercising regularly at least 3-5 days a week.      Increase Strength and Stamina Yes Yes      Intervention Provide advice, education, support and counseling about physical activity/exercise needs.;Develop an individualized exercise prescription for aerobic and resistive training based on initial evaluation findings, risk stratification, comorbidities and participant's  personal goals. Provide advice, education, support and counseling about physical activity/exercise needs.;Develop an individualized exercise prescription for aerobic and resistive training based on initial evaluation findings, risk stratification, comorbidities and participant's personal goals.      Expected Outcomes Short Term: Increase workloads from initial exercise prescription for resistance, speed, and METs.;Short Term: Perform resistance training exercises routinely during rehab and add in resistance training at home;Long Term: Improve cardiorespiratory fitness, muscular endurance and strength as measured by increased METs and functional capacity (6MWT) Short Term: Increase workloads from initial exercise prescription for resistance, speed, and METs.;Short Term: Perform resistance training exercises routinely during rehab and add in resistance training at home;Long Term: Improve cardiorespiratory fitness, muscular endurance and strength as measured by increased METs and functional capacity (6MWT)      Able to understand and use rate of perceived exertion (RPE) scale Yes Yes      Intervention Provide education and explanation on how to use RPE scale Provide education and explanation on how to use RPE scale      Expected Outcomes Short Term: Able to use RPE daily in rehab to express subjective intensity level;Long Term:  Able to use RPE to guide intensity level when exercising independently Short Term: Able to use RPE daily in rehab to express subjective intensity level;Long Term:  Able to use RPE to guide intensity level when exercising independently      Able to understand  and use Dyspnea scale Yes Yes      Intervention Provide education and explanation on how to use Dyspnea scale Provide education and explanation on how to use Dyspnea scale      Expected Outcomes Short Term: Able to use Dyspnea scale daily in rehab to express subjective sense of shortness of breath during exertion;Long Term: Able to  use Dyspnea scale to guide intensity level when exercising independently Short Term: Able to use Dyspnea scale daily in rehab to express subjective sense of shortness of breath during exertion;Long Term: Able to use Dyspnea scale to guide intensity level when exercising independently      Knowledge and understanding of Target Heart Rate Range (THRR) Yes Yes      Intervention Provide education and explanation of THRR including how the numbers were predicted and where they are located for reference Provide education and explanation of THRR including how the numbers were predicted and where they are located for reference      Expected Outcomes Short Term: Able to state/look up THRR;Short Term: Able to use daily as guideline for intensity in rehab;Long Term: Able to use THRR to govern intensity when exercising independently Short Term: Able to state/look up THRR;Short Term: Able to use daily as guideline for intensity in rehab;Long Term: Able to use THRR to govern intensity when exercising independently      Understanding of Exercise Prescription Yes Yes      Intervention Provide education, explanation, and written materials on patient's individual exercise prescription Provide education, explanation, and written materials on patient's individual exercise prescription      Expected Outcomes Short Term: Able to explain program exercise prescription;Long Term: Able to explain home exercise prescription to exercise independently Short Term: Able to explain program exercise prescription;Long Term: Able to explain home exercise prescription to exercise independently               Exercise Goals Re-Evaluation :  Exercise Goals Re-Evaluation     [place] Name 03/19/22 1434             Exercise Goal Re-Evaluation   Comments Pt has not shown for her first session. Will update when able.                Discharge Exercise Prescription (Final Exercise Prescription Changes):   Nutrition:  Target Goals:  Understanding of nutrition guidelines, daily intake of sodium '1500mg'$ , cholesterol '200mg'$ , calories 30% from fat and 7% or less from saturated fats, daily to have 5 or more servings of fruits and vegetables.  Biometrics:  Pre Biometrics - 03/12/22 0959       Pre Biometrics   Height '5\' 11"'$  (1.803 m)    Weight 120.1 kg    Waist Circumference 46.5 inches    Hip Circumference 51 inches    Waist to Hip Ratio 0.91 %    BMI (Calculated) 36.94    Triceps Skinfold 26 mm    % Body Fat 44.5 %    Grip Strength 32.5 kg    Flexibility 0 in    Single Leg Stand 60 seconds              Nutrition Therapy Plan and Nutrition Goals:  Nutrition Therapy & Goals - 03/13/22 1305       Personal Nutrition Goals   Comments Patient scored 27 on diet assessment. We provide educational sessions on heart healthy nutrition with handouts and assistance with RD referrals if patient is interested.      Intervention Plan  Intervention Nutrition handout(s) given to patient.    Expected Outcomes Short Term Goal: Understand basic principles of dietary content, such as calories, fat, sodium, cholesterol and nutrients.             Nutrition Assessments:  Nutrition Assessments - 03/12/22 0841       MEDFICTS Scores   Pre Score 57            MEDIFICTS Score Key: ?70 Need to make dietary changes  40-70 Heart Healthy Diet ? 40 Therapeutic Level Cholesterol Diet   Picture Your Plate Scores: D34-534 Unhealthy dietary pattern with much room for improvement. 41-50 Dietary pattern unlikely to meet recommendations for good health and room for improvement. 51-60 More healthful dietary pattern, with some room for improvement.  >60 Healthy dietary pattern, although there may be some specific behaviors that could be improved.    Nutrition Goals Re-Evaluation:   Nutrition Goals Discharge (Final Nutrition Goals Re-Evaluation):   Psychosocial: Target Goals: Acknowledge presence or absence of significant  depression and/or stress, maximize coping skills, provide positive support system. Participant is able to verbalize types and ability to use techniques and skills needed for reducing stress and depression.  Initial Review & Psychosocial Screening:  Initial Psych Review & Screening - 03/12/22 0835       Initial Review   Current issues with None Identified      Family Dynamics   Good Support System? Yes    Comments She lists her parents, daughter, and fiance as her support system.      Barriers   Psychosocial barriers to participate in program There are no identifiable barriers or psychosocial needs.;The patient should benefit from training in stress management and relaxation.      Screening Interventions   Interventions Encouraged to exercise    Expected Outcomes Long Term goal: The participant improves quality of Life and PHQ9 Scores as seen by post scores and/or verbalization of changes;Short Term goal: Identification and review with participant of any Quality of Life or Depression concerns found by scoring the questionnaire.             Quality of Life Scores:  Quality of Life - 03/12/22 0856       Quality of Life   Select Quality of Life      Quality of Life Scores   Health/Function Pre 20.57 %    Socioeconomic Pre 22.2 %    Psych/Spiritual Pre 26.36 %    Family Pre 25.5 %    GLOBAL Pre 22.86 %            Scores of 19 and below usually indicate a poorer quality of life in these areas.  A difference of  2-3 points is a clinically meaningful difference.  A difference of 2-3 points in the total score of the Quality of Life Index has been associated with significant improvement in overall quality of life, self-image, physical symptoms, and general health in studies assessing change in quality of life.   PHQ-9: Review Flowsheet  More data exists      03/12/2022 01/24/2022 03/25/2018 03/11/2018 01/28/2018  Depression screen PHQ 2/9  Decreased Interest 1 0 0 0 0  Down,  Depressed, Hopeless 0 0 0 0 0  PHQ - 2 Score 1 0 0 0 0  Altered sleeping 0 - - - -  Tired, decreased energy 1 - - - -  Change in appetite 0 - - - -  Feeling bad or failure about yourself  0 - - - -  Trouble concentrating 0 - - - -  Moving slowly or fidgety/restless 0 - - - -  Suicidal thoughts 0 - - - -  PHQ-9 Score 2 - - - -  Difficult doing work/chores Somewhat difficult - - - -   Interpretation of Total Score  Total Score Depression Severity:  1-4 = Minimal depression, 5-9 = Mild depression, 10-14 = Moderate depression, 15-19 = Moderately severe depression, 20-27 = Severe depression   Psychosocial Evaluation and Intervention:  Psychosocial Evaluation - 03/12/22 0959       Psychosocial Evaluation & Interventions   Interventions Stress management education;Relaxation education;Encouraged to exercise with the program and follow exercise prescription    Comments Pt has no barriers to participate in PR. She has no identifiable psychosocial issues. She scored a 2 on her PHQ-9. She states that she can feel when her iron levels are getting low, so when this happens, she does not feel like doing anything. She receives routine iron infusions. She also feels like she has little energy sometimes, but this is related to her heart condition. She states that she has a good support system with her parents, daughter, and fiance. She has a 31 year old daughter, and she currently does not have the energy to keep up with her. She is currently smoking 3-4 cigarettes per day. This is down from a pack per day, and she plans to continue to wean herself down until she stops completely. She has tried nicotine gum in the past, but she reports that it did not help. Her goals while in the program are to decrease her SOB with exertion and to be able to keep up with her daughter better. She would like to play with her daughter in the park without becoming SOB. During her recent [place] and TEE, it was found that she is  having signiciant mitral regurgitation. The cardiology team will be meeting about her case later this week to determine their next course of action. She states that they have told her they may need to go in to repair or replace the valve. I explained to her that if this is the case, we would likely switch her over to the cardiac program once she recovers. She is eager to start the program.    Expected Outcomes Pt will continue to have no psychosocial issues identified.    Continue Psychosocial Services  No Follow up required             Psychosocial Re-Evaluation:  Psychosocial Re-Evaluation     [place] Name 03/13/22 1304             Psychosocial Re-Evaluation   Current issues with None Identified       Comments Patient is new to the program. She plans to start 2/27. We will continue to monitor her progress.       Expected Outcomes Patient will continue to have no psychosocial barriers identified.       Interventions Stress management education;Encouraged to attend Pulmonary Rehabilitation for the exercise;Relaxation education       Continue Psychosocial Services  No Follow up required                Psychosocial Discharge (Final Psychosocial Re-Evaluation):  Psychosocial Re-Evaluation - 03/13/22 1304       Psychosocial Re-Evaluation   Current issues with None Identified    Comments Patient is new to the program. She plans to start 2/27. We will continue to monitor her progress.    Expected  Outcomes Patient will continue to have no psychosocial barriers identified.    Interventions Stress management education;Encouraged to attend Pulmonary Rehabilitation for the exercise;Relaxation education    Continue Psychosocial Services  No Follow up required              Education: Education Goals: Education classes will be provided on a weekly basis, covering required topics. Participant will state understanding/return demonstration of topics presented.  Learning  Barriers/Preferences:  Learning Barriers/Preferences - 03/12/22 0844       Learning Barriers/Preferences   Learning Barriers None    Learning Preferences Written Material             Education Topics: How Lungs Work and Diseases: - Discuss the anatomy of the lungs and diseases that can affect the lungs, such as COPD.   Exercise: -Discuss the importance of exercise, FITT principles of exercise, normal and abnormal responses to exercise, and how to exercise safely.   Environmental Irritants: -Discuss types of environmental irritants and how to limit exposure to environmental irritants.   Meds/Inhalers and oxygen: - Discuss respiratory medications, definition of an inhaler and oxygen, and the proper way to use an inhaler and oxygen.   Energy Saving Techniques: - Discuss methods to conserve energy and decrease shortness of breath when performing activities of daily living.    Bronchial Hygiene / Breathing Techniques: - Discuss breathing mechanics, pursed-lip breathing technique,  proper posture, effective ways to clear airways, and other functional breathing techniques   Cleaning Equipment: - Provides group verbal and written instruction about the health risks of elevated stress, cause of high stress, and healthy ways to reduce stress.   Nutrition I: Fats: - Discuss the types of cholesterol, what cholesterol does to the body, and how cholesterol levels can be controlled.   Nutrition II: Labels: -Discuss the different components of food labels and how to read food labels.   Respiratory Infections: - Discuss the signs and symptoms of respiratory infections, ways to prevent respiratory infections, and the importance of seeking medical treatment when having a respiratory infection.   Stress I: Signs and Symptoms: - Discuss the causes of stress, how stress may lead to anxiety and depression, and ways to limit stress.   Stress II: Relaxation: -Discuss relaxation  techniques to limit stress.   Oxygen for Home/Travel: - Discuss how to prepare for travel when on oxygen and proper ways to transport and store oxygen to ensure safety.   Knowledge Questionnaire Score:  Knowledge Questionnaire Score - 03/12/22 0844       Knowledge Questionnaire Score   Pre Score 14/18             Core Components/Risk Factors/Patient Goals at Admission:  Personal Goals and Risk Factors at Admission - 03/12/22 1005       Core Components/Risk Factors/Patient Goals on Admission   Tobacco Cessation Yes    Number of packs per day 2-4 cigs per day    Intervention Assist the participant in steps to quit. Provide individualized education and counseling about committing to Tobacco Cessation, relapse prevention, and pharmacological support that can be provided by physician.;Advice worker, assist with locating and accessing local/national Quit Smoking programs, and support quit date choice.    Expected Outcomes Short Term: Will demonstrate readiness to quit, by selecting a quit date.;Short Term: Will quit all tobacco product use, adhering to prevention of relapse plan.;Long Term: Complete abstinence from all tobacco products for at least 12 months from quit date.    Improve shortness of  breath with ADL's Yes    Intervention Provide education, individualized exercise plan and daily activity instruction to help decrease symptoms of SOB with activities of daily living.    Expected Outcomes Short Term: Improve cardiorespiratory fitness to achieve a reduction of symptoms when performing ADLs;Long Term: Be able to perform more ADLs without symptoms or delay the onset of symptoms    Heart Failure Yes    Intervention Provide a combined exercise and nutrition program that is supplemented with education, support and counseling about heart failure. Directed toward relieving symptoms such as shortness of breath, decreased exercise tolerance, and extremity edema.    Expected  Outcomes Short term: Attendance in program 2-3 days a week with increased exercise capacity. Reported lower sodium intake. Reported increased fruit and vegetable intake. Reports medication compliance.;Improve functional capacity of life;Short term: Daily weights obtained and reported for increase. Utilizing diuretic protocols set by physician.;Long term: Adoption of self-care skills and reduction of barriers for early signs and symptoms recognition and intervention leading to self-care maintenance.    Hypertension Yes    Intervention Provide education on lifestyle modifcations including regular physical activity/exercise, weight management, moderate sodium restriction and increased consumption of fresh fruit, vegetables, and low fat dairy, alcohol moderation, and smoking cessation.;Monitor prescription use compliance.    Expected Outcomes Short Term: Continued assessment and intervention until BP is < 140/14m HG in hypertensive participants. < 130/88mHG in hypertensive participants with diabetes, heart failure or chronic kidney disease.;Long Term: Maintenance of blood pressure at goal levels.    Personal Goal Other Yes    Personal Goal To be able to be more active with her 7 79ear old daughter and to play in the park with her without worrying about being too SOB.    Intervention Attend PR two days per week and begin a home exercise program.    Expected Outcomes Pt will meet stated goals.             Core Components/Risk Factors/Patient Goals Review:   Goals and Risk Factor Review     Row Name 03/13/22 1306             Core Components/Risk Factors/Patient Goals Review   Personal Goals Review Heart Failure;Improve shortness of breath with ADL's;Hypertension;Other;Tobacco Cessation;Weight Management/Obesity       Review Patient referred to PR with Chronic combined systolic and diastolic HF. She plans to start the program 2/27. Her personal goals for the program are to improve her SOB; be  able to be more active with her 7 1ear old daughter; be able to play in the park with her daughter without worrying about getting SOB. We will continue to monitor her progress as she works towards meeting these goals.       Expected Outcomes Patient will complete the program meeting both personal and program goala.                Core Components/Risk Factors/Patient Goals at Discharge (Final Review):   Goals and Risk Factor Review - 03/13/22 1306       Core Components/Risk Factors/Patient Goals Review   Personal Goals Review Heart Failure;Improve shortness of breath with ADL's;Hypertension;Other;Tobacco Cessation;Weight Management/Obesity    Review Patient referred to PR with Chronic combined systolic and diastolic HF. She plans to start the program 2/27. Her personal goals for the program are to improve her SOB; be able to be more active with her 7 3ear old daughter; be able to play in the park with her daughter without worrying  about getting SOB. We will continue to monitor her progress as she works towards meeting these goals.    Expected Outcomes Patient will complete the program meeting both personal and program goala.             ITP Comments:   Comments: ITP REVIEW Pt has only completed orientation.  Recommend continued exercise, life style modification, education, and utilization of breathing techniques to increase stamina and strength and decrease shortness of breath with exertion.

## 2022-03-21 ENCOUNTER — Telehealth: Payer: Self-pay

## 2022-03-21 ENCOUNTER — Encounter (HOSPITAL_COMMUNITY)
Admission: RE | Admit: 2022-03-21 | Discharge: 2022-03-21 | Disposition: A | Discharge status: Home / Self Care | Payer: Medicare HMO | Source: Ambulatory Visit | Attending: Cardiology | Admitting: Cardiology

## 2022-03-21 DIAGNOSIS — I5042 Chronic combined systolic (congestive) and diastolic (congestive) heart failure: Secondary | ICD-10-CM

## 2022-03-21 LAB — GLUCOSE, CAPILLARY: Glucose-Capillary: 118 mg/dL — ABNORMAL HIGH (ref 70–99)

## 2022-03-21 NOTE — Progress Notes (Signed)
Daily Session Note  Patient Details  Name: [name] [name] MRN: [id number] Date of Birth: Jul 02, 1975 Referring Provider:   Flowsheet Row PULMONARY REHAB OTHER RESP ORIENTATION from 03/12/2022 in [place]  Referring Provider Dr. Daniel Nones       Encounter Date: 03/21/2022  Check In:  Session Check In - 03/21/22 1045       Check-In   Supervising physician immediately available to respond to emergencies [facility] MD immediately available    Physician(s) Dr Harl Bowie    Location [facility]    Staff Present Hoy Register MHA, MS, ACSM-CEP;Leana Roe, BS, Exercise Physiologist;Crockett Rallo Hassell Done, RN, BSN    Virtual Visit No    Medication changes reported     No    Fall or balance concerns reported    Yes    Comments She has fallen twice this year. She often feels dizzy and lightheaded which causes her to fall.    Tobacco Cessation No Change    Warm-up and Cool-down Performed as group-led instruction    Resistance Training Performed Yes    VAD Patient? No    PAD/SET Patient? No      Pain Assessment   Currently in Pain? No/denies    Pain Score 0-No pain    Multiple Pain Sites No             Capillary Blood Glucose: Results for orders placed or performed during the hospital encounter of 03/21/22 (from the past 24 hour(s))  Glucose, capillary     Status: Abnormal   Collection Time: 03/21/22 10:36 AM  Result Value Ref Range   Glucose-Capillary 118 (H) 70 - 99 mg/dL      Social History   Tobacco Use  Smoking Status Every Day   Years: 18.00   Types: Cigarettes  Smokeless Tobacco Never  Tobacco Comments   Smokes 2-3 cigarettes daily as of 02/13/22    Goals Met:  Proper associated with RPD/PD & O2 Sat Independence with exercise equipment Using PLB without cueing & demonstrates good technique Exercise tolerated well Queuing for purse lip breathing No report of concerns or symptoms today Strength training completed today  Goals  Unmet:  Not Applicable  Comments: Checkout at 1145.   Dr. Kathie Dike is Medical Director for [facility].

## 2022-03-25 ENCOUNTER — Telehealth: Payer: Self-pay

## 2022-03-25 NOTE — Telephone Encounter (Signed)
Per [employer] MitraClip review, "For patient LS: This is Secondary MR and does not require a surgical consult  This valve looks like a suitable for a MitraClip implant. In the Bicaval and SAXB views, the fossa looks reasonable for a transseptal puncture. The LA dimensions are large enough for steering and straddle of the Clip Delivery System. The broad based MR jet is caused by an anterior leaflet override, restricted posterior leaflet with posteriorly directed jet. The posterior leaflet measures between 12-33m in the LVOT grasping view. Gradient measures about 4.678mg at a HR of 63bpm, MVA measures 4.45cm2. Based on this information, I would start with and N/XTW at A2/P2 of just medial, reassess for gradient/residual and plan for an additional clip.  *TR noted"

## 2022-03-26 ENCOUNTER — Encounter (HOSPITAL_COMMUNITY)
Admission: RE | Admit: 2022-03-26 | Discharge: 2022-03-26 | Disposition: A | Discharge status: Home / Self Care | Payer: Medicare HMO | Source: Ambulatory Visit | Attending: Cardiology | Admitting: Cardiology

## 2022-03-26 DIAGNOSIS — I5042 Chronic combined systolic (congestive) and diastolic (congestive) heart failure: Secondary | ICD-10-CM | POA: Diagnosis not present

## 2022-03-26 LAB — GLUCOSE, CAPILLARY: Glucose-Capillary: 123 mg/dL — ABNORMAL HIGH (ref 70–99)

## 2022-03-26 NOTE — Progress Notes (Signed)
Daily Session Note  Patient Details  Name: [name] [name] MRN: [id number] Date of Birth: 1975-11-28 Referring Provider:   Flowsheet Row PULMONARY REHAB OTHER RESP ORIENTATION from 03/12/2022 in [place]  Referring Provider Dr. Daniel Nones       Encounter Date: 03/26/2022  Check In:  Session Check In - 03/26/22 1043       Check-In   Supervising physician immediately available to respond to emergencies [facility] MD immediately available    Physician(s) Dr Harrington Challenger    Location [facility]    Staff Present Hoy Register MHA, MS, ACSM-CEP;Leana Roe, BS, Exercise Physiologist;Lynnette Pote Hassell Done, RN, BSN    Virtual Visit No    Medication changes reported     No    Fall or balance concerns reported    Yes    Comments She has fallen twice this year. She often feels dizzy and lightheaded which causes her to fall.    Tobacco Cessation No Change    Warm-up and Cool-down Performed as group-led instruction    Resistance Training Performed Yes    VAD Patient? No    PAD/SET Patient? No      Pain Assessment   Currently in Pain? No/denies    Pain Score 0-No pain    Multiple Pain Sites No             Capillary Blood Glucose: Results for orders placed or performed during the hospital encounter of 03/26/22 (from the past 24 hour(s))  Glucose, capillary     Status: Abnormal   Collection Time: 03/26/22 10:35 AM  Result Value Ref Range   Glucose-Capillary 123 (H) 70 - 99 mg/dL      Social History   Tobacco Use  Smoking Status Every Day   Years: 18.00   Types: Cigarettes  Smokeless Tobacco Never  Tobacco Comments   Smokes 2-3 cigarettes daily as of 02/13/22    Goals Met:  Proper associated with RPD/PD & O2 Sat Independence with exercise equipment Using PLB without cueing & demonstrates good technique Exercise tolerated well Queuing for purse lip breathing No report of concerns or symptoms today Strength training completed today  Goals  Unmet:  Not Applicable  Comments: Checkout at 1145.   Dr. Kathie Dike is Medical Director for [facility] Pulmonary Re[facility].

## 2022-03-28 ENCOUNTER — Encounter (HOSPITAL_COMMUNITY)
Admission: RE | Admit: 2022-03-28 | Discharge: 2022-03-28 | Disposition: A | Discharge status: Home / Self Care | Payer: Medicare HMO | Source: Ambulatory Visit | Attending: Cardiology | Admitting: Cardiology

## 2022-03-28 DIAGNOSIS — I5042 Chronic combined systolic (congestive) and diastolic (congestive) heart failure: Secondary | ICD-10-CM

## 2022-03-28 LAB — GLUCOSE, CAPILLARY: Glucose-Capillary: 110 mg/dL — ABNORMAL HIGH (ref 70–99)

## 2022-03-28 NOTE — Progress Notes (Signed)
Daily Session Note  Patient Details  Name: [name] [name] MRN: [id number] Date of Birth: [birth year] Referring Provider:   Flowsheet Row PULMONARY REHAB OTHER RESP ORIENTATION from 03/12/2022 in [place]  Referring Provider Dr. Daniel Nones       Encounter Date: 03/28/2022  Check In:  Session Check In - 03/28/22 1045       Check-In   Supervising physician immediately available to respond to emergencies [facility] MD immediately available    Physician(s) Dr. Harl Bowie    Location [facility]    Staff Present Leana Roe, BS, Exercise Physiologist;Dalton Sherrie George, MS, ACSM-CEP;Melven Sartorius BSN, RN    Virtual Visit No    Medication changes reported     No    Fall or balance concerns reported    Yes    Comments She has fallen twice this year. She often feels dizzy and lightheaded which causes her to fall.    Tobacco Cessation No Change    Warm-up and Cool-down Performed as group-led instruction    Resistance Training Performed Yes    VAD Patient? No    PAD/SET Patient? No      Pain Assessment   Currently in Pain? No/denies    Pain Score 0-No pain    Multiple Pain Sites No             Capillary Blood Glucose: No results found for this or any previous visit (from the past 24 hour(s)).    Social History   Tobacco Use  Smoking Status Every Day   Years: 18.00   Types: Cigarettes  Smokeless Tobacco Never  Tobacco Comments   Smokes 2-3 cigarettes daily as of 02/13/22    Goals Met:  Proper associated with RPD/PD & O2 Sat Independence with exercise equipment Using PLB without cueing & demonstrates good technique Exercise tolerated well Queuing for purse lip breathing No report of concerns or symptoms today Strength training completed today  Goals Unmet:  Not Applicable  Comments: check out at 11:45   Dr. Kathie Dike is Medical Director for [facility] Pulmonary Re[facility].

## 2022-03-29 ENCOUNTER — Encounter: Payer: Self-pay | Admitting: Nurse Practitioner

## 2022-03-29 ENCOUNTER — Ambulatory Visit: Payer: Medicare HMO | Attending: Nurse Practitioner | Admitting: Nurse Practitioner

## 2022-03-29 ENCOUNTER — Telehealth: Payer: Self-pay | Admitting: Nurse Practitioner

## 2022-03-29 VITALS — BP 106/70 | HR 76 | Ht 71.0 in | Wt 242.0 lb

## 2022-03-29 DIAGNOSIS — Z86711 Personal history of pulmonary embolism: Secondary | ICD-10-CM | POA: Diagnosis not present

## 2022-03-29 DIAGNOSIS — I5020 Unspecified systolic (congestive) heart failure: Secondary | ICD-10-CM

## 2022-03-29 DIAGNOSIS — I428 Other cardiomyopathies: Secondary | ICD-10-CM | POA: Diagnosis not present

## 2022-03-29 DIAGNOSIS — Z86718 Personal history of other venous thrombosis and embolism: Secondary | ICD-10-CM | POA: Diagnosis not present

## 2022-03-29 DIAGNOSIS — I4729 Other ventricular tachycardia: Secondary | ICD-10-CM | POA: Diagnosis not present

## 2022-03-29 DIAGNOSIS — M7989 Other specified soft tissue disorders: Secondary | ICD-10-CM | POA: Diagnosis not present

## 2022-03-29 DIAGNOSIS — I10 Essential (primary) hypertension: Secondary | ICD-10-CM | POA: Diagnosis not present

## 2022-03-29 DIAGNOSIS — E669 Obesity, unspecified: Secondary | ICD-10-CM | POA: Diagnosis not present

## 2022-03-29 DIAGNOSIS — Z87898 Personal history of other specified conditions: Secondary | ICD-10-CM | POA: Diagnosis not present

## 2022-03-29 DIAGNOSIS — I2510 Atherosclerotic heart disease of native coronary artery without angina pectoris: Secondary | ICD-10-CM

## 2022-03-29 DIAGNOSIS — Z79899 Other long term (current) drug therapy: Secondary | ICD-10-CM

## 2022-03-29 DIAGNOSIS — R222 Localized swelling, mass and lump, trunk: Secondary | ICD-10-CM

## 2022-03-29 NOTE — Patient Instructions (Signed)
Medication Instructions:  Your physician recommends that you continue on your current medications as directed. Please refer to the Current Medication list given to you today.   Labwork: BMET (due in one week at [facility])  Testing/Procedures: Chest CT   Follow-Up:  Your physician recommends that you schedule a follow-up appointment in: 6-8 weeks  Any Other Special Instructions Will Be Listed Below (If Applicable).  If you need a refill on your cardiac medications before your next appointment, please call your pharmacy.

## 2022-03-29 NOTE — Telephone Encounter (Signed)
Percert for CT Chest w/o contast   04/17/2022 at [facility]

## 2022-03-29 NOTE — Progress Notes (Signed)
Cardiology Office Note:    Date:  03/29/2022  ID:  [name] [name], DOB [birth year], MRN [id number]  PCP:  Carrolyn Meiers, [place] [facility] Cardiologist:  Carlyle Dolly, MD Electrophysiologist:  Melida Quitter, MD      Referring MD: Carrolyn Meiers*   CC: Here for follow-up  History of Present Illness:    [name] [name] is a 47 y.o. 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 Harl Bowie in 2019 for PE in 2015 to get set up with Coumadin clinic.  In January 2023, had recurrent left lower extremity DVT.  CTA was checked for PE, negative.  Admitted in May 2023 for CAP CHF, worsening anemia requiring 3 units of PRBC, TTE revealed mildly reduced EF at 45 to 50% requiring diuresis.    Readmitted 05/2021 with respiratory failure requiring intubation, was felt to be likely combination of CHF and pneumonia. RHC in 05/2021 revealed 40 to 50% pRCA narrowing, otherwise normal coronary arteries.  LVEDP elevated, consistent with acute on chronic combined CHF. BP's were soft during admission, therefore limiting medication therapy.  TTE 08/2021 revealed EF 30 to 35%.    Admitted 09/2021 with syncope, nausea, dizziness with subsequent C2 fracture. Was leaning over truck, felt nauseous, face planted down and went completely out.  No surgery recommended. TTE revealed EF 30 to 35%, mild LVH, mildly elevated PASP, mildly reduced RVSF, mild RVE, moderate LAE, mild RAE, moderate to severe MR, mild to moderate TR. Syncope felt to be vasovagal.  Preliminary monitor report revealed NSR, occasional NSVT (max lasting 9 beats), 4% PVCs, and less than 1% PACs.   Presented to [facility] on 12/28/21 for  right breast pain and swelling, endorsed drainage from nipple. Coumadin was held, was bridged with heparin, and underwent punch biopsy by general surgery on 12/29/21. Pathology showed benign breast parenchyma with acute and chronic inflammation. Ultrasound showed retro areolar abscess and underwent aspiration on 01/01/2022. Hospital course complicated by IDA, required 2 units of PRBC for low hemoglobin. BNP was elevated at 1232, found to be having acute CHF exacerbation. Noted lower extremity swelling during hospitalization. Titration of GDMT was limited d/t baseline hypotension during hospitalization, some antihypertensives were held. Experienced left thigh pain during hospitalization.  CT was negative for fluid collection or hematoma.     Presented back to [facility] 01/2022 for acute on chronic HFrEF. Repeat Echo showed EF 35-40%, low normal RVF, severe MR, mild to mod TR. She endorsed some indiscretion with fluid intake. Viral panel came back positive for Coronavirus OC43. CXR without consolidation.   Underwent RHC on 03/08/2022 that revealed severely elevated pre and postcapillary filling pressures, mild to moderately elevated PA mean and PVR secondary to combined pre and postcapillary pulmonary hypertension, moderately reduced CI recommended to increase outpatient diuretics and start evaluation for mitral valve repair/replacement.  Repeat TTE 02/2022 revealed akinesis of inferolateral wall with overall moderate to severe LV dysfunction, EF 30 to 35%, restricted posterior MV leaflet with severe eccentric MR, no evidence of MS, severe MR. TEE results below.   Today she presents for follow-up.  Doing well.  Denies any acute cardiac complaints or issues. Has lost 74 lbs since I first saw her. Denies  any chest pain, shortness of breath, palpitations, syncope, presyncope, dizziness, orthopnea, PND, swelling or significant weight changes, acute bleeding, or claudication. Tolerating her medications well.  Says she has an  appointment on April 29, 2022 with the structural heart team for evaluation for possible upcoming mitral valve repair.  Past Medical History:  Diagnosis Date   Anemia    Blood transfusion without reported diagnosis    CAD (coronary artery disease)    Nonobstructive   Closed left ankle fracture    August 30 2012   Clotting disorder [facility])    DVT (deep venous thrombosis) (HCC)    L leg   Fibroid tumor    HFrEF (heart failure with reduced ejection fraction) (HCC)    Hidradenitis    Hypertension    Mitral regurgitation    NICM (nonischemic cardiomyopathy) (HCC)    NSVT (nonsustained ventricular tachycardia) (HCC)    Obesity    OSA on CPAP    Pulmonary embolism ([place]) 04/2013   Sleep apnea     Past Surgical History:  Procedure Laterality Date   ABSCESS DRAINAGE Bilateral 01/10/2015   BREAST BIOPSY Right 02/01/2020   benign   COLONOSCOPY WITH PROPOFOL N/A 06/08/2018   Procedure: COLONOSCOPY WITH PROPOFOL;  Surgeon: Daneil Dolin, MD;  Location: [facility];  Service: Endoscopy;  Laterality: N/A;  2:30pm   CYST REMOVAL TRUNK     IVC FILTER PLACEMENT ([place] HX)     LOWER EXTREMITY VENOGRAPHY N/A 02/07/2021   Procedure: LOWER EXTREMITY VENOGRAPHY;  Surgeon: [name], MD;  Location: [place] CV LAB;  Service: Cardiovascular;  Laterality: N/A;   PERIPHERAL VASCULAR THROMBECTOMY N/A 02/07/2021   Procedure: PERIPHERAL VASCULAR THROMBECTOMY;  Surgeon: [name], MD;  Location: [place] CV LAB;  Service: Cardiovascular;  Laterality: N/A;   RIGHT HEART CATH N/A 03/08/2022   Procedure: RIGHT HEART CATH;  Surgeon: Hebert Soho, DO;  Location: [place] CV LAB;  Service: Cardiovascular;  Laterality: N/A;   RIGHT/LEFT HEART CATH AND CORONARY ANGIOGRAPHY N/A 06/19/2021   Procedure: RIGHT/LEFT HEART CATH AND CORONARY ANGIOGRAPHY;  Surgeon: Belva Crome, MD;  Location: [place] CV LAB;  Service: Cardiovascular;  Laterality: N/A;   TEE WITHOUT CARDIOVERSION N/A  03/08/2022   Procedure: TRANSESOPHAGEAL ECHOCARDIOGRAM (TEE);  Surgeon: Hebert Soho, DO;  Location: [facility];  Service: Cardiovascular;  Laterality: N/A;    Current Medications: Current Meds  Medication Sig   albuterol (VENTOLIN HFA) 108 (90 Base) MCG/ACT inhaler Inhale 2 puffs into the lungs every 2 (two) hours as needed for wheezing or shortness of breath.   atorvastatin (LIPITOR) 20 MG tablet Take 1 tablet (20 mg total) by mouth daily.   empagliflozin (JARDIANCE) 10 MG TABS tablet Take 1 tablet (10 mg total) by mouth daily before breakfast.   furosemide (LASIX) 40 MG tablet Take 40 mg by mouth 2 (two) times daily.   losartan (COZAAR) 25 MG tablet Take 0.5 tablets (12.5 mg total) by mouth daily.   Magnesium Oxide 400 MG CAPS Take 1 capsule (400 mg total) by mouth daily.   metoprolol succinate (TOPROL-XL) 25 MG 24 hr tablet Take 1 tablet (25 mg total) by mouth daily. Take with or immediately following a meal.   potassium chloride 20 MEQ TBCR Take 20 mEq by mouth 2 (two) times daily.   spironolactone (ALDACTONE) 25 MG tablet Take 0.5 tablets (12.5 mg total) by mouth daily.   warfarin (COUMADIN) 10 MG tablet Take 5-10 mg by mouth See admin instructions. Take as  directed in the evening : Monday take 10 mg and all other days take 5 mg.   warfarin (COUMADIN) 5 MG tablet TAKE (1) OR (1) 1/2 TABLET BY MOUTH DAILY AS DIRECTED.    midodrine (PROAMATINE) 2.5 MG tablet Take 1 tablet (2.5 mg total) by mouth 2 (two) times daily with a meal.     Allergies:   Sulfa antibiotics   Social History   Socioeconomic History   Marital status: Widowed    Spouse name: Not on file   Number of children: Not on file   Years of education: Not on file   Highest education level: Not on file  Occupational History   Not on file  Tobacco Use   Smoking status: Every Day    Years: 18.00    Types: Cigarettes   Smokeless tobacco: Never   Tobacco comments:    Smokes 2-3 cigarettes daily as of 02/13/22   Vaping Use   Vaping Use: Never used  Substance and Sexual Activity   Alcohol use: Not Currently    Alcohol/week: 2.0 standard drinks of alcohol    Types: 2 Cans of beer per week    Comment: twice a month   Drug use: No   Sexual activity: Not Currently    Birth control/protection: Pill, None  Other Topics Concern   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    Has a 46 year old [place].   Live with parents.   Was working 5 days a weeks.   Not working right now.    Attends church.    Social Determinants of Health   Financial Resource Strain: Not on file  Food Insecurity: No Food Insecurity (02/13/2022)   Hunger Vital Sign    Worried About Running Out of Food in the Last Year: Never true    Ran Out of Food in the Last Year: Never true  Transportation Needs: No Transportation Needs (02/13/2022)   PRAPARE - Hydrologist (Medical): No    Lack of Transportation (Non-Medical): No  Physical Activity: Not on file  Stress: Not on file  Social Connections: Not on file     Family History: The patient's family history includes Cancer in her maternal grandmother; Diabetes in her paternal aunt and paternal uncle; Heart attack in her paternal grandmother; Hypertension in her mother; Other in her father. There is no history of Colon cancer.  ROS:    Please see the history of present illness.    All other systems reviewed and are negative.  EKGs/Labs/Other Studies Reviewed:    The following studies were reviewed today:   EKG:  EKG is not ordered today.    TTE on 03/14/2022:  1. Akinesis of the inferolateral wall with overall moderate to severe LV  dysfunction; restricted posterior MV leaflet with severe, eccentric MR.   2. Left ventricular ejection fraction, by estimation, is 30 to 35%. The  left ventricle has moderately decreased function. The left ventricle  demonstrates regional wall motion  abnormalities (see scoring  diagram/findings for description). The left  ventricular internal cavity size was severely dilated. Left ventricular  diastolic function could not be evaluated.   3. Right ventricular systolic function is moderately reduced. The right  ventricular size is normal. There is severely elevated pulmonary artery  systolic pressure.   4. Left atrial size was moderately dilated.   5. The mitral valve is normal in  structure. Severe mitral valve  regurgitation. No evidence of mitral stenosis.   6. The aortic valve is tricuspid. Aortic valve regurgitation is not  visualized. Aortic valve sclerosis is present, with no evidence of aortic  valve stenosis.   7. The inferior vena cava is dilated in size with <50% respiratory  variability, suggesting right atrial pressure of 15 mmHg.  [place] on 03/08/2022:  IMPRESSION: Severely elevated pre and post capillary filling pressures.  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    RECOMMENDATIONS: - Increase outpatient diuretics.  - Start evaluation for mitral valve repair/replacement.   TEE on 03/08/2022:   1. Left ventricular ejection fraction, by estimation, is 35%. The left  ventricle has moderately decreased function. The left ventricular internal  cavity size was moderately to severely dilated measuring 7.1cm.   2. Right ventricular systolic function is moderately reduced. The right  ventricular size is mildly enlarged.   3. Left atrial size was moderately dilated. No left atrial/left atrial  appendage thrombus was detected.   4. Right atrial size was mildly dilated.   5. There is severe posteriorly directed eccentric mitral valve  regurgitation secondary to restriction of the posterior mitral valve  leaflet and poor leaflet coaptation. There is blunting of right upper  pulmonary vein flow. BP during study 100s/60s.   6.  Tricuspid valve regurgitation is mild to moderate.   7. The aortic valve is normal in structure. Aortic valve regurgitation is  not visualized.  Cardiac monitor on 12/17/2021:   30 day monitor. Data available from 75% of planned monitored time   Min HR 61, Max HR 159, Avg HR 81   Symptoms correlated with sinus rhythm, rare PVCs.   Short runs of SVT at times with aberrancy without symptoms reported. 8 and 4  beat run of NSVT no reported symptoms  2D echocardiogram on September 24, 2021: 1. Left ventricular ejection fraction, by estimation, is 30 to 35%. The  left ventricle has moderately decreased function. Left ventricular  endocardial border not optimally defined to evaluate regional wall motion,  global hypokinesis seen. The left  ventricular internal cavity size was moderately dilated. There is mild  left ventricular hypertrophy. Left ventricular diastolic parameters are  indeterminate.   2. Right ventricular systolic function is mildly reduced. The right  ventricular size is mildly enlarged. There is mildly elevated pulmonary  artery systolic pressure. The estimated right ventricular systolic  pressure is [id number] mmHg.   3. Left atrial size was moderately dilated.   4. Right atrial size was mildly dilated.   5. MR mechanism appears to be posterior leaflet restriction with relative  anterior leaflet override with posteriorly directed jet of MR. Review of  serial studies suggests regional wall motion abnormalities contribute to  posterior leaflet restriction. No   pulmonary vein reversal seen on this study, systolic PV blunting.. The  mitral valve is grossly normal. Moderate to severe mitral valve  regurgitation. No evidence of mitral stenosis.   6. Tricuspid valve regurgitation is mild to moderate.   7. The aortic valve is tricuspid. Aortic valve regurgitation is not  visualized. No aortic stenosis is present.   8. The inferior vena cava is dilated in size with <50% respiratory   variability, suggesting right atrial pressure of 15 mmHg.  Right and left heart cath on Jun 19, 2021: 40 to 50% proximal right coronary narrowing. Right dominant anatomy Coronary arteries otherwise normal Significant  elevation LVEDP 30 mmHg consistent with acute on chronic combined systolic and diastolic heart failure Mild pulmonary hypertension, mean pressure 30 mmHg.  WHO group II etiology based on hemodysnamics.. Capillary wedge mean 24 mmHg.  V wave to 40 mmHg suggesting some degree of mitral regurgitation. Cardiac output 8.5 L/min with index 3.57 L/min/m. Pulmonary resistance 0.7 Wood units.   RECOMMENDATIONS: Care with IV fluid administration. Needs diuresis. Start heart failure therapy including ARNI and SGLT2 as tolerated.  Peripheral vascular thrombectomy on 02/17/2021: Completion venogram of the left leg demonstrated resolution of the thrombotic burden with no flow-limiting stenosis appreciated through the common femoral vein and femoral vein.  There was narrowing appreciated in the external iliac vein, however as noted on previous CT, this appears to be due to to fibroids.  I elected not to stent the patient, and will have her see obstetrics and gynecology.  I do not think this was the nidus for her thrombus, but rather having a subtherapeutic INR.   Juliana tolerated the case well.  The percutaneous access site was closed with 4-0 Monocryl suture.  Custom compression stockings have been ordered, and the leg was wrapped postoperatively.   Venous ultrasound of lower left extremity on January 30, 2021: Acute, nearly occlusive thrombus extending from the left popliteal vein through the common femoral vein. Extent of thrombus central to the inguinal ligament is unable to be determined on this study. Poor visualization of the calf veins.   Recommend CTV abdomen pelvis to evaluate for central extent of left lower extremity deep vein thrombosis. If thrombus extends into  the pelvis, recommend consultation to Interventional Radiology or other qualified endovascular specialist.   Recent Labs: 11/14/2021: TSH 3.122 02/13/2022: ALT 15 02/16/2022: Magnesium 2.1 03/11/2022: B Natriuretic Peptide 1,854.6 03/12/2022: BUN 20; Creatinine, Ser 1.03; Hemoglobin 10.3; Platelets 256; Potassium 3.8; Sodium 138  Recent Lipid Panel    Component Value Date/Time   CHOL 77 11/14/2021 0816   TRIG 35 11/14/2021 0816   HDL 27 (L) 11/14/2021 0816   CHOLHDL 2.9 11/14/2021 0816   VLDL 7 11/14/2021 0816   LDLCALC 43 11/14/2021 0816   LDLCALC 92 01/31/2017 1119    Physical Exam:    VS:  BP 106/70   Pulse 76   Ht '5\' 11"'$  (1.803 m)   Wt 242 lb (109.8 kg)   SpO2 98%   BMI 33.75 kg/m     Wt Readings from Last 3 Encounters:  03/29/22 242 lb (109.8 kg)  03/12/22 264 lb 12.4 oz (120.1 kg)  03/11/22 263 lb 3.2 oz (119.4 kg)     GEN: Obese, 47 y.o. female in no acute distress HEENT: Normal NECK: No JVD; No carotid bruits CARDIAC: S1/S2, RRR, no murmurs, rubs, gallops; 2+ pulses; Focal swelling noted to right chest wall under collar bone.  RESPIRATORY:  Clear to auscultation without rales, wheezing or rhonchi  ABDOMEN: Soft, non-tender, non-distended MUSCULOSKELETAL:  Improved swelling of right breast; Mild, nonpitting edema (L>R), no deformity  SKIN: Warm and dry NEUROLOGIC:  Alert and oriented x 3 PSYCHIATRIC:  Normal affect   ASSESSMENT:    1. HFrEF (heart failure with reduced ejection fraction) ([place])   2. NICM (nonischemic cardiomyopathy) ([place])   3. Medication management   4. History of syncope   5. NSVT (nonsustained ventricular tachycardia) (HCC)   6. Coronary artery disease involving native heart without angina pectoris, unspecified vessel or lesion type   7. Essential hypertension, benign   8. History of pulmonary embolism   9. History  of DVT (deep vein thrombosis)   10. Left leg swelling   11. Obesity (BMI 30-39.9)   12. Localized swelling of chest wall      PLAN:    In order of problems listed above:  HFrEF, NICM TTE 02/2022 EF 30-35%. Euvolemic and well compensated on exam. Etiology unclear for heart failure.  Cardiac MRI has been arranged and ordered previously.  Continue current medication regimen, with her current BP unable to up-titrate GDMT. Low sodium diet, fluid restriction <2L, and daily weights encouraged. Educated to contact our office for weight gain of 2 lbs overnight or 5 lbs in one week. Continue pulmonary rehab.  Follow-up with HF clinic. Will obtain BMET as recent medications were adjusted at HF clinic recently.   Hx of syncope, hx of NSVT Etiology felt to be vasovagal. Denies any recurrence or symptoms. Past monitor revealed NSVT/PVC's. Continue current medication regimen.  Cardiac MRI and right heart cath have been previously arranged as mentioned above.  Heart healthy diet recommended. Continue to follow-up with EP.   CAD Stable with no anginal symptoms. No indication for ischemic evaluation.  Previous cardiac catheterization 05/2021 revealed nonobstructive CAD.  Not on aspirin due to being on Coumadin.  Continue atorvastatin, losartan, and Toprol-XL.  Heart healthy diet recommended.  HTN Blood pressure soft. Continue current medication regimen.   Discussed to monitor BP at home at least 2 hours after medications and sitting for 5-10 minutes. Heart healthy diet recommended.   Hx of PE/DVT, left leg swelling Previous history of unprovoked saddle PE in 2015, hx of recurrent unprovoked DVT with hx of thrombectomy in 01/2021. Has IVC filter placed, could not be removed previously d/t significant thrombus burden on filter. Denies any bleeding issues while on Coumadin.  Continue to follow-up with Coumadin clinic. Lower extremity swelling (L>R) improved and chronic from past injury, will cancel previously ordered dopplers. Has hx of recurrent left lower extremity DVT.   6. Obesity BMI improved, now 33.75. Congratulated her on her  significant weight loss. Weight loss via diet and exercise as tolerated encouraged. Discussed the impact being overweight would have on cardiovascular risk.  7. Focal swelling along right chest wall Noted below collar bone, said has been there for several months, but more prominent now that she has lost weight. Will arrange non-contrast CT scan of chest. Continue to follow with PCP.  8.  Disposition: Follow up with me or [facility]  in 6-8 weeks. Follow up with Dr. Carlyle Dolly in 6 months.  Medication Adjustments/Labs and Tests Ordered: Current medicines are reviewed at length with the patient today.  Concerns regarding medicines are outlined above.  Orders Placed This Encounter  Procedures   CT Chest Wo Contrast   Basic metabolic panel   No orders of the defined types were placed in this encounter.   Patient Instructions  Medication Instructions:  Your physician recommends that you continue on your current medications as directed. Please refer to the Current Medication list given to you today.   Labwork: BMET (due in one week at [facility])  Testing/Procedures: Chest CT   Follow-Up:  Your physician recommends that you schedule a follow-up appointment in: 6-8 weeks  Any Other Special Instructions Will Be Listed Below (If Applicable).  If you need a refill on your cardiac medications before your next appointment, please call your pharmacy.    SignedFinis Bud, NP  03/29/2022 12:38 PM    [place]

## 2022-04-02 ENCOUNTER — Encounter (HOSPITAL_COMMUNITY): Payer: Medicare HMO

## 2022-04-03 ENCOUNTER — Ambulatory Visit: Payer: Medicare HMO | Attending: Cardiology | Admitting: *Deleted

## 2022-04-03 DIAGNOSIS — Z86718 Personal history of other venous thrombosis and embolism: Secondary | ICD-10-CM

## 2022-04-03 DIAGNOSIS — Z86711 Personal history of pulmonary embolism: Secondary | ICD-10-CM | POA: Diagnosis not present

## 2022-04-03 DIAGNOSIS — Z5181 Encounter for therapeutic drug level monitoring: Secondary | ICD-10-CM | POA: Diagnosis not present

## 2022-04-03 LAB — POCT INR: INR: 1.4 — AB (ref 2.0–3.0)

## 2022-04-03 NOTE — Patient Instructions (Signed)
Increase warfarin to 1/2 tablet daily except 1 tablet on Mondays, Wednesdays and Fridays Recheck in 2 wks

## 2022-04-04 ENCOUNTER — Encounter (HOSPITAL_COMMUNITY): Payer: Medicare HMO

## 2022-04-08 ENCOUNTER — Encounter (HOSPITAL_COMMUNITY): Payer: Medicare HMO

## 2022-04-09 ENCOUNTER — Encounter (HOSPITAL_COMMUNITY): Payer: Medicare HMO

## 2022-04-11 ENCOUNTER — Encounter (HOSPITAL_COMMUNITY): Payer: Medicare HMO

## 2022-04-11 ENCOUNTER — Telehealth (HOSPITAL_COMMUNITY): Payer: Self-pay | Admitting: *Deleted

## 2022-04-11 NOTE — Telephone Encounter (Signed)
Patient returning call about her upcoming cardiac imaging study; pt verbalizes understanding of appt date/time, parking situation and where to check inn and verified current allergies; name and call back number provided for further questions should they arise  Gordy Clement RN Navigator Cardiac Imaging [facility] [phone] office [phone] cell  Patient denies claustrophobia but does endorse an IVC filter.

## 2022-04-11 NOTE — Telephone Encounter (Signed)
Attempted to call patient regarding upcoming cardiac MRI appointment. Left message on voicemail with name and callback number  Mayukha Symmonds RN Navigator Cardiac Imaging [facility] [phone] Office [phone] Cell  

## 2022-04-12 ENCOUNTER — Encounter (HOSPITAL_COMMUNITY): Payer: Self-pay

## 2022-04-12 ENCOUNTER — Ambulatory Visit (HOSPITAL_COMMUNITY): Admission: RE | Admit: 2022-04-12 | Payer: Medicare HMO | Source: Ambulatory Visit

## 2022-04-16 ENCOUNTER — Encounter (HOSPITAL_COMMUNITY): Payer: Medicare HMO

## 2022-04-17 ENCOUNTER — Ambulatory Visit (HOSPITAL_COMMUNITY): Payer: Medicare HMO

## 2022-04-17 NOTE — Progress Notes (Signed)
Pulmonary Individual Treatment Plan  Patient Details  Name: [name] [name] MRN: [id number] Date of Birth: 1975-05-09 Referring Provider:   Flowsheet Row PULMONARY REHAB OTHER RESP ORIENTATION from 03/12/2022 in [place]  Referring Provider Dr. Daniel Nones       Initial Encounter Date:  Flowsheet Row PULMONARY REHAB OTHER RESP ORIENTATION from 03/12/2022 in [place]  Date 03/12/22       Visit Diagnosis: Chronic combined systolic and diastolic CHF (congestive heart failure) (HCC)  Patient's Home Medications on Admission:   Current Outpatient Medications:    albuterol (VENTOLIN HFA) 108 (90 Base) MCG/ACT inhaler, Inhale 2 puffs into the lungs every 2 (two) hours as needed for wheezing or shortness of breath., Disp: 18 g, Rfl: 11   atorvastatin (LIPITOR) 20 MG tablet, Take 1 tablet (20 mg total) by mouth daily., Disp: 90 tablet, Rfl: 3   empagliflozin (JARDIANCE) 10 MG TABS tablet, Take 1 tablet (10 mg total) by mouth daily before breakfast., Disp: 90 tablet, Rfl: 3   furosemide (LASIX) 40 MG tablet, Take 80 mg by mouth 2 (two) times daily., Disp: , Rfl:    losartan (COZAAR) 25 MG tablet, Take 0.5 tablets (12.5 mg total) by mouth daily., Disp: 90 tablet, Rfl: 3   Magnesium Oxide 400 MG CAPS, Take 1 capsule (400 mg total) by mouth daily., Disp: 90 capsule, Rfl: 3   metoprolol succinate (TOPROL-XL) 25 MG 24 hr tablet, Take 1 tablet (25 mg total) by mouth daily. Take with or immediately following a meal., Disp: 90 tablet, Rfl: 3   midodrine (PROAMATINE) 2.5 MG tablet, Take 1 tablet (2.5 mg total) by mouth 2 (two) times daily with a meal., Disp: 60 tablet, Rfl: 6   potassium chloride SA (KLOR-CON M) 20 MEQ tablet, Take 20 mEq by mouth 2 (two) times daily., Disp: , Rfl:    spironolactone (ALDACTONE) 25 MG tablet, Take 1 tablet (25 mg total) by mouth daily., Disp: 90 tablet, Rfl: 3   warfarin (COUMADIN) 10 MG tablet, Take 1/2 tablet to 1 tablet  daily or as directed by coumadin clinic., Disp: 20 tablet, Rfl: 0   warfarin (COUMADIN) 5 MG tablet, TAKE (1) OR (1) 1/2 TABLET BY MOUTH DAILY AS DIRECTED. (Patient taking differently: Take 5-10 mg by mouth See admin instructions. Take 5 mg every evening  EXCEPT: Monday take 10 mg (Tablet)), Disp: , Rfl:  No current facility-administered medications for this encounter.  Facility-Administered Medications Ordered in Other Encounters:    0.9 %  sodium chloride infusion, , Intravenous, Continuous, Kefalas, Manon Hilding, PA-C, Last Rate: 300 mL/hr at 03/08/22 [id number], New Bag at 03/08/22 [id number]  Past Medical History: Past Medical History:  Diagnosis Date   Anemia    Blood transfusion without reported diagnosis    CAD (coronary artery disease)    Nonobstructive   Closed left ankle fracture    August 30 2012   Clotting disorder [facility])    DVT (deep venous thrombosis) (HCC)    L leg   Fibroid tumor    HFrEF (heart failure with reduced ejection fraction) (HCC)    Hidradenitis    Hypertension    Mitral regurgitation    NICM (nonischemic cardiomyopathy) (HCC)    NSVT (nonsustained ventricular tachycardia) (HCC)    Obesity    OSA on CPAP    Pulmonary embolism ([place]) 04/2013   Sleep apnea     Tobacco Use: Social History   Tobacco Use  Smoking Status Every Day   Years: 84  Types: Cigarettes  Smokeless Tobacco Never  Tobacco Comments   Smokes 2-3 cigarettes daily as of 02/13/22    Labs: Review Flowsheet  More data exists      Latest Ref Rng & Units 06/16/2021 06/19/2021 08/21/2021 11/14/2021 03/08/2022  Labs for ITP Cardiac and Pulmonary Rehab  Cholestrol 0 - 200 mg/dL - - 86  77  -  LDL (calc) 0 - 99 mg/dL - - 49  43  -  HDL-C >40 mg/dL - - 25  27  -  Trlycerides <150 mg/dL 52  - 59  35  -  PH, Arterial 7.35 - 7.45 - 7.433  - - -  PCO2 arterial 32 - 48 mmHg - 41.7  - - -  Bicarbonate 20.0 - 28.0 mmol/L 20.0 - 28.0 mmol/L - 27.9  29.5  30.8  30.2  - - 28.9  29.2   TCO2 22 - 32 mmol/L 22 -  32 mmol/L - 29  31  32  32  - - 31  31   O2 Saturation % % - 96  59  60  64  - - 44  45     Capillary Blood Glucose: Lab Results  Component Value Date   GLUCAP 110 (H) 03/28/2022   GLUCAP 123 (H) 03/26/2022   GLUCAP 118 (H) 03/21/2022   GLUCAP 100 (H) 03/12/2022   GLUCAP 75 03/12/2022    POCT Glucose     Row Name 03/12/22 0953             POCT Blood Glucose   Pre-Exercise 100 mg/dL                Pulmonary Assessment Scores:  Pulmonary Assessment Scores     Row Name 03/12/22 0830         ADL UCSD   ADL Phase Entry     SOB Score total 60     Rest 0     Walk 2     Stairs 3     Bath 2     Dress 3     Shop 3       CAT Score   CAT Score 18       mMRC Score   mMRC Score 3             UCSD: Self-administered rating of dyspnea associated with activities of daily living (ADLs) 6-point scale (0 = "not at all" to 5 = "maximal or unable to do because of breathlessness")  Scoring Scores range from 0 to 120.  Minimally important difference is 5 units  CAT: CAT can identify the health impairment of COPD patients and is better correlated with disease progression.  CAT has a scoring range of zero to 40. The CAT score is classified into four groups of low (less than 10), medium (10 - 20), high (21-30) and very high (31-40) based on the impact level of disease on health status. A CAT score over 10 suggests significant symptoms.  A worsening CAT score could be explained by an exacerbation, poor medication adherence, poor inhaler technique, or progression of COPD or comorbid conditions.  CAT MCID is 2 points  mMRC: mMRC (Modified Medical Research Council) Dyspnea Scale is used to assess the degree of baseline functional disability in patients of respiratory disease due to dyspnea. No minimal important difference is established. A decrease in score of 1 point or greater is considered a positive change.   Pulmonary Function Assessment:   Exercise Target  Goals:  Exercise Program Goal: Individual exercise prescription set using results from initial 6 min walk test and THRR while considering  patient's activity barriers and safety.   Exercise Prescription Goal: Initial exercise prescription builds to 30-45 minutes a day of aerobic activity, 2-3 days per week.  Home exercise guidelines will be given to patient during program as part of exercise prescription that the participant will acknowledge.  Activity Barriers & Risk Stratification:  Activity Barriers & Cardiac Risk Stratification - 03/12/22 0832       Activity Barriers & Cardiac Risk Stratification   Activity Barriers Back Problems;Deconditioning;Shortness of Breath;Decreased Ventricular Function;Balance Concerns;History of Falls    Cardiac Risk Stratification High             6 Minute Walk:  6 Minute Walk     Row Name 03/12/22 0954         6 Minute Walk   Distance 800 feet     Walk Time 6 minutes     # of Rest Breaks 2     MPH 1.52     METS 2.6     RPE 11     Perceived Dyspnea  13     VO2 Peak 9.11     Symptoms Yes (comment)     Comments 2 standing rest breaks for 30 sec each due to SOB     Resting HR 65 bpm     Resting BP 98/64     Resting Oxygen Saturation  100 %     Exercise Oxygen Saturation  during 6 min walk 96 %     Max Ex. HR 91 bpm     Max Ex. BP 108/72     2 Minute Post BP 96/70       Interval HR   1 Minute HR 82     2 Minute HR 88     3 Minute HR 89     4 Minute HR 90     5 Minute HR 85     6 Minute HR 91     2 Minute Post HR 84     Interval Heart Rate? Yes       Interval Oxygen   Interval Oxygen? Yes     Baseline Oxygen Saturation % 100 %     1 Minute Oxygen Saturation % 96 %     1 Minute Liters of Oxygen 0 L     2 Minute Oxygen Saturation % 97 %     2 Minute Liters of Oxygen 0 L     3 Minute Oxygen Saturation % 100 %     3 Minute Liters of Oxygen 0 L     4 Minute Oxygen Saturation % 99 %     4 Minute Liters of Oxygen 0 L     5 Minute  Oxygen Saturation % 100 %     5 Minute Liters of Oxygen 0 L     6 Minute Oxygen Saturation % 100 %     6 Minute Liters of Oxygen 0 L     2 Minute Post Oxygen Saturation % 98 %     2 Minute Post Liters of Oxygen 0 L              Oxygen Initial Assessment:  Oxygen Initial Assessment - 03/12/22 1008       Home Oxygen   Home Oxygen Device None    Sleep Oxygen Prescription CPAP    Home Exercise Oxygen Prescription None  Home Resting Oxygen Prescription None    Compliance with Home Oxygen Use Yes      Initial 6 min Walk   Oxygen Used None      Program Oxygen Prescription   Program Oxygen Prescription None      Intervention   Short Term Goals To learn and understand importance of monitoring SPO2 with pulse oximeter and demonstrate accurate use of the pulse oximeter.;To learn and understand importance of maintaining oxygen saturations>88%;To learn and demonstrate proper pursed lip breathing techniques or other breathing techniques.     Long  Term Goals Verbalizes importance of monitoring SPO2 with pulse oximeter and return demonstration;Maintenance of O2 saturations>88%;Exhibits proper breathing techniques, such as pursed lip breathing or other method taught during program session;Compliance with respiratory medication             Oxygen Re-Evaluation:  Oxygen Re-Evaluation     Row Name 03/19/22 1435 04/16/22 1437           Program Oxygen Prescription   Program Oxygen Prescription None None        Home Oxygen   Home Oxygen Device None None      Sleep Oxygen Prescription CPAP CPAP      Home Exercise Oxygen Prescription None None      Home Resting Oxygen Prescription None None      Compliance with Home Oxygen Use Yes Yes        Goals/Expected Outcomes   Short Term Goals To learn and understand importance of monitoring SPO2 with pulse oximeter and demonstrate accurate use of the pulse oximeter.;To learn and understand importance of maintaining oxygen  saturations>88%;To learn and demonstrate proper pursed lip breathing techniques or other breathing techniques.  To learn and understand importance of monitoring SPO2 with pulse oximeter and demonstrate accurate use of the pulse oximeter.;To learn and understand importance of maintaining oxygen saturations>88%;To learn and demonstrate proper pursed lip breathing techniques or other breathing techniques.       Long  Term Goals Verbalizes importance of monitoring SPO2 with pulse oximeter and return demonstration;Maintenance of O2 saturations>88%;Exhibits proper breathing techniques, such as pursed lip breathing or other method taught during program session;Compliance with respiratory medication Verbalizes importance of monitoring SPO2 with pulse oximeter and return demonstration;Maintenance of O2 saturations>88%;Exhibits proper breathing techniques, such as pursed lip breathing or other method taught during program session;Compliance with respiratory medication      Goals/Expected Outcomes -- compliance               Oxygen Discharge (Final Oxygen Re-Evaluation):  Oxygen Re-Evaluation - 04/16/22 1437       Program Oxygen Prescription   Program Oxygen Prescription None      Home Oxygen   Home Oxygen Device None    Sleep Oxygen Prescription CPAP    Home Exercise Oxygen Prescription None    Home Resting Oxygen Prescription None    Compliance with Home Oxygen Use Yes      Goals/Expected Outcomes   Short Term Goals To learn and understand importance of monitoring SPO2 with pulse oximeter and demonstrate accurate use of the pulse oximeter.;To learn and understand importance of maintaining oxygen saturations>88%;To learn and demonstrate proper pursed lip breathing techniques or other breathing techniques.     Long  Term Goals Verbalizes importance of monitoring SPO2 with pulse oximeter and return demonstration;Maintenance of O2 saturations>88%;Exhibits proper breathing techniques, such as pursed lip  breathing or other method taught during program session;Compliance with respiratory medication    Goals/Expected Outcomes compliance  Initial Exercise Prescription:  Initial Exercise Prescription - 03/12/22 0900       Date of Initial Exercise RX and Referring Provider   Date 03/12/22    Referring Provider Dr. Daniel Nones    Expected Discharge Date 07/18/22      NuStep   Level 1    SPM 80    Minutes 39      Prescription Details   Frequency (times per week) 2    Duration Progress to 30 minutes of continuous aerobic without signs/symptoms of physical distress      Intensity   THRR 40-80% of Max Heartrate 70-140    Ratings of Perceived Exertion 11-13    Perceived Dyspnea 0-4      Resistance Training   Training Prescription Yes    Weight 3    Reps 10-15             Perform Capillary Blood Glucose checks as needed.  Exercise Prescription Changes:   Exercise Prescription Changes     Row Name 03/28/22 1200             Response to Exercise   Blood Pressure (Admit) 100/62       Blood Pressure (Exercise) 118/70       Blood Pressure (Exit) 110/60       Heart Rate (Admit) 83 bpm       Heart Rate (Exercise) 97 bpm       Heart Rate (Exit) 82 bpm       Oxygen Saturation (Admit) 98 %       Oxygen Saturation (Exercise) 98 %       Oxygen Saturation (Exit) 98 %       Rating of Perceived Exertion (Exercise) 11       Perceived Dyspnea (Exercise) 11       Duration Continue with 30 min of aerobic exercise without signs/symptoms of physical distress.       Intensity THRR unchanged         Progression   Progression Continue to progress workloads to maintain intensity without signs/symptoms of physical distress.         Resistance Training   Training Prescription Yes       Weight 3       Reps 10-15       Time 10 Minutes         NuStep   Level 3       SPM 106       Minutes 39       METs 2.6                Exercise Comments:   Exercise Goals  and Review:   Exercise Goals     Row Name 03/12/22 [id number] 03/19/22 1434 04/16/22 1317 04/16/22 1428       Exercise Goals   Increase Physical Activity Yes Yes Yes Yes    Intervention Provide advice, education, support and counseling about physical activity/exercise needs.;Develop an individualized exercise prescription for aerobic and resistive training based on initial evaluation findings, risk stratification, comorbidities and participant's personal goals. Provide advice, education, support and counseling about physical activity/exercise needs.;Develop an individualized exercise prescription for aerobic and resistive training based on initial evaluation findings, risk stratification, comorbidities and participant's personal goals. Provide advice, education, support and counseling about physical activity/exercise needs.;Develop an individualized exercise prescription for aerobic and resistive training based on initial evaluation findings, risk stratification, comorbidities and participant's personal goals. Provide advice, education, support and counseling about physical  activity/exercise needs.;Develop an individualized exercise prescription for aerobic and resistive training based on initial evaluation findings, risk stratification, comorbidities and participant's personal goals.    Expected Outcomes Short Term: Attend rehab on a regular basis to increase amount of physical activity.;Long Term: Add in home exercise to make exercise part of routine and to increase amount of physical activity.;Long Term: Exercising regularly at least 3-5 days a week. Short Term: Attend rehab on a regular basis to increase amount of physical activity.;Long Term: Add in home exercise to make exercise part of routine and to increase amount of physical activity.;Long Term: Exercising regularly at least 3-5 days a week. Short Term: Attend rehab on a regular basis to increase amount of physical activity.;Long Term: Add in home  exercise to make exercise part of routine and to increase amount of physical activity.;Long Term: Exercising regularly at least 3-5 days a week. Short Term: Attend rehab on a regular basis to increase amount of physical activity.;Long Term: Add in home exercise to make exercise part of routine and to increase amount of physical activity.;Long Term: Exercising regularly at least 3-5 days a week.    Increase Strength and Stamina Yes Yes -- Yes    Intervention Provide advice, education, support and counseling about physical activity/exercise needs.;Develop an individualized exercise prescription for aerobic and resistive training based on initial evaluation findings, risk stratification, comorbidities and participant's personal goals. Provide advice, education, support and counseling about physical activity/exercise needs.;Develop an individualized exercise prescription for aerobic and resistive training based on initial evaluation findings, risk stratification, comorbidities and participant's personal goals. -- Provide advice, education, support and counseling about physical activity/exercise needs.;Develop an individualized exercise prescription for aerobic and resistive training based on initial evaluation findings, risk stratification, comorbidities and participant's personal goals.    Expected Outcomes Short Term: Increase workloads from initial exercise prescription for resistance, speed, and METs.;Short Term: Perform resistance training exercises routinely during rehab and add in resistance training at home;Long Term: Improve cardiorespiratory fitness, muscular endurance and strength as measured by increased METs and functional capacity (6MWT) Short Term: Increase workloads from initial exercise prescription for resistance, speed, and METs.;Short Term: Perform resistance training exercises routinely during rehab and add in resistance training at home;Long Term: Improve cardiorespiratory fitness, muscular  endurance and strength as measured by increased METs and functional capacity (6MWT) -- Short Term: Increase workloads from initial exercise prescription for resistance, speed, and METs.;Short Term: Perform resistance training exercises routinely during rehab and add in resistance training at home;Long Term: Improve cardiorespiratory fitness, muscular endurance and strength as measured by increased METs and functional capacity (6MWT)    Able to understand and use rate of perceived exertion (RPE) scale Yes Yes -- Yes    Intervention Provide education and explanation on how to use RPE scale Provide education and explanation on how to use RPE scale -- Provide education and explanation on how to use RPE scale    Expected Outcomes Short Term: Able to use RPE daily in rehab to express subjective intensity level;Long Term:  Able to use RPE to guide intensity level when exercising independently Short Term: Able to use RPE daily in rehab to express subjective intensity level;Long Term:  Able to use RPE to guide intensity level when exercising independently -- Short Term: Able to use RPE daily in rehab to express subjective intensity level;Long Term:  Able to use RPE to guide intensity level when exercising independently    Able to understand and use Dyspnea scale Yes Yes -- Yes  Intervention Provide education and explanation on how to use Dyspnea scale Provide education and explanation on how to use Dyspnea scale -- Provide education and explanation on how to use Dyspnea scale    Expected Outcomes Short Term: Able to use Dyspnea scale daily in rehab to express subjective sense of shortness of breath during exertion;Long Term: Able to use Dyspnea scale to guide intensity level when exercising independently Short Term: Able to use Dyspnea scale daily in rehab to express subjective sense of shortness of breath during exertion;Long Term: Able to use Dyspnea scale to guide intensity level when exercising independently --  Short Term: Able to use Dyspnea scale daily in rehab to express subjective sense of shortness of breath during exertion;Long Term: Able to use Dyspnea scale to guide intensity level when exercising independently    Knowledge and understanding of Target Heart Rate Range (THRR) Yes Yes -- Yes    Intervention Provide education and explanation of THRR including how the numbers were predicted and where they are located for reference Provide education and explanation of THRR including how the numbers were predicted and where they are located for reference -- Provide education and explanation of THRR including how the numbers were predicted and where they are located for reference    Expected Outcomes Short Term: Able to state/look up THRR;Short Term: Able to use daily as guideline for intensity in rehab;Long Term: Able to use THRR to govern intensity when exercising independently Short Term: Able to state/look up THRR;Short Term: Able to use daily as guideline for intensity in rehab;Long Term: Able to use THRR to govern intensity when exercising independently -- Short Term: Able to state/look up THRR;Short Term: Able to use daily as guideline for intensity in rehab;Long Term: Able to use THRR to govern intensity when exercising independently    Understanding of Exercise Prescription Yes Yes -- Yes    Intervention Provide education, explanation, and written materials on patient's individual exercise prescription Provide education, explanation, and written materials on patient's individual exercise prescription -- Provide education, explanation, and written materials on patient's individual exercise prescription    Expected Outcomes Short Term: Able to explain program exercise prescription;Long Term: Able to explain home exercise prescription to exercise independently Short Term: Able to explain program exercise prescription;Long Term: Able to explain home exercise prescription to exercise independently -- Short Term:  Able to explain program exercise prescription;Long Term: Able to explain home exercise prescription to exercise independently             Exercise Goals Re-Evaluation :  Exercise Goals Re-Evaluation     [place] Name 03/19/22 1434 04/16/22 1428           Exercise Goal Re-Evaluation   Exercise Goals Review -- Increase Physical Activity;Increase Strength and Stamina;Able to understand and use rate of perceived exertion (RPE) scale;Able to understand and use Dyspnea scale;Knowledge and understanding of Target Heart Rate Range (THRR);Understanding of Exercise Prescription      Comments Pt has not shown for her first session. Will update when able. Pt has completed 3 sessions of PR.She has not been back since 3/07 and has not called. She seemed motivated when coming and egar to be in the program. She was exercising at 2.6 METs on the stepper. Will update when able.      Expected Outcomes -- Through exercise at home and at rehab, the patient will meet their stated goals.               Discharge Exercise Prescription (  Final Exercise Prescription Changes):  Exercise Prescription Changes - 03/28/22 1200       Response to Exercise   Blood Pressure (Admit) 100/62    Blood Pressure (Exercise) 118/70    Blood Pressure (Exit) 110/60    Heart Rate (Admit) 83 bpm    Heart Rate (Exercise) 97 bpm    Heart Rate (Exit) 82 bpm    Oxygen Saturation (Admit) 98 %    Oxygen Saturation (Exercise) 98 %    Oxygen Saturation (Exit) 98 %    Rating of Perceived Exertion (Exercise) 11    Perceived Dyspnea (Exercise) 11    Duration Continue with 30 min of aerobic exercise without signs/symptoms of physical distress.    Intensity THRR unchanged      Progression   Progression Continue to progress workloads to maintain intensity without signs/symptoms of physical distress.      Resistance Training   Training Prescription Yes    Weight 3    Reps 10-15    Time 10 Minutes      NuStep   Level 3    SPM  106    Minutes 39    METs 2.6             Nutrition:  Target Goals: Understanding of nutrition guidelines, daily intake of sodium 1500mg , cholesterol 200mg , calories 30% from fat and 7% or less from saturated fats, daily to have 5 or more servings of fruits and vegetables.  Biometrics:  Pre Biometrics - 03/12/22 0959       Pre Biometrics   Height 5\' 11"  (1.803 m)    Weight 120.1 kg    Waist Circumference 46.5 inches    Hip Circumference 51 inches    Waist to Hip Ratio 0.91 %    BMI (Calculated) 36.94    Triceps Skinfold 26 mm    % Body Fat 44.5 %    Grip Strength 32.5 kg    Flexibility 0 in    Single Leg Stand 60 seconds              Nutrition Therapy Plan and Nutrition Goals:  Nutrition Therapy & Goals - 03/13/22 1305       Personal Nutrition Goals   Comments Patient scored 27 on diet assessment. We provide educational sessions on heart healthy nutrition with handouts and assistance with RD referrals if patient is interested.      Intervention Plan   Intervention Nutrition handout(s) given to patient.    Expected Outcomes Short Term Goal: Understand basic principles of dietary content, such as calories, fat, sodium, cholesterol and nutrients.             Nutrition Assessments:  Nutrition Assessments - 03/12/22 0841       MEDFICTS Scores   Pre Score 57            MEDIFICTS Score Key: ?70 Need to make dietary changes  40-70 Heart Healthy Diet ? 40 Therapeutic Level Cholesterol Diet   Picture Your Plate Scores: D34-534 Unhealthy dietary pattern with much room for improvement. 41-50 Dietary pattern unlikely to meet recommendations for good health and room for improvement. 51-60 More healthful dietary pattern, with some room for improvement.  >60 Healthy dietary pattern, although there may be some specific behaviors that could be improved.    Nutrition Goals Re-Evaluation:   Nutrition Goals Discharge (Final Nutrition Goals  Re-Evaluation):   Psychosocial: Target Goals: Acknowledge presence or absence of significant depression and/or stress, maximize coping skills, provide positive support  system. Participant is able to verbalize types and ability to use techniques and skills needed for reducing stress and depression.  Initial Review & Psychosocial Screening:  Initial Psych Review & Screening - 03/12/22 0835       Initial Review   Current issues with None Identified      Family Dynamics   Good Support System? Yes    Comments She lists her parents, daughter, and fiance as her support system.      Barriers   Psychosocial barriers to participate in program There are no identifiable barriers or psychosocial needs.;The patient should benefit from training in stress management and relaxation.      Screening Interventions   Interventions Encouraged to exercise    Expected Outcomes Long Term goal: The participant improves quality of Life and PHQ9 Scores as seen by post scores and/or verbalization of changes;Short Term goal: Identification and review with participant of any Quality of Life or Depression concerns found by scoring the questionnaire.             Quality of Life Scores:  Quality of Life - 03/12/22 0856       Quality of Life   Select Quality of Life      Quality of Life Scores   Health/Function Pre 20.57 %    Socioeconomic Pre 22.2 %    Psych/Spiritual Pre 26.36 %    Family Pre 25.5 %    GLOBAL Pre 22.86 %            Scores of 19 and below usually indicate a poorer quality of life in these areas.  A difference of  2-3 points is a clinically meaningful difference.  A difference of 2-3 points in the total score of the Quality of Life Index has been associated with significant improvement in overall quality of life, self-image, physical symptoms, and general health in studies assessing change in quality of life.   PHQ-9: Review Flowsheet  More data exists      03/12/2022 01/24/2022  03/25/2018 03/11/2018 01/28/2018  Depression screen PHQ 2/9  Decreased Interest 1 0 0 0 0  Down, Depressed, Hopeless 0 0 0 0 0  PHQ - 2 Score 1 0 0 0 0  Altered sleeping 0 - - - -  Tired, decreased energy 1 - - - -  Change in appetite 0 - - - -  Feeling bad or failure about yourself  0 - - - -  Trouble concentrating 0 - - - -  Moving slowly or fidgety/restless 0 - - - -  Suicidal thoughts 0 - - - -  PHQ-9 Score 2 - - - -  Difficult doing work/chores Somewhat difficult - - - -   Interpretation of Total Score  Total Score Depression Severity:  1-4 = Minimal depression, 5-9 = Mild depression, 10-14 = Moderate depression, 15-19 = Moderately severe depression, 20-27 = Severe depression   Psychosocial Evaluation and Intervention:  Psychosocial Evaluation - 03/12/22 0959       Psychosocial Evaluation & Interventions   Interventions Stress management education;Relaxation education;Encouraged to exercise with the program and follow exercise prescription    Comments Pt has no barriers to participate in PR. She has no identifiable psychosocial issues. She scored a 2 on her PHQ-9. She states that she can feel when her iron levels are getting low, so when this happens, she does not feel like doing anything. She receives routine iron infusions. She also feels like she has little energy sometimes, but this  is related to her heart condition. She states that she has a good support system with her parents, daughter, and fiance. She has a 79 year old daughter, and she currently does not have the energy to keep up with her. She is currently smoking 3-4 cigarettes per day. This is down from a pack per day, and she plans to continue to wean herself down until she stops completely. She has tried nicotine gum in the past, but she reports that it did not help. Her goals while in the program are to decrease her SOB with exertion and to be able to keep up with her daughter better. She would like to play with her  daughter in the park without becoming SOB. During her recent [place] and TEE, it was found that she is having signiciant mitral regurgitation. The cardiology team will be meeting about her case later this week to determine their next course of action. She states that they have told her they may need to go in to repair or replace the valve. I explained to her that if this is the case, we would likely switch her over to the cardiac program once she recovers. She is eager to start the program.    Expected Outcomes Pt will continue to have no psychosocial issues identified.    Continue Psychosocial Services  No Follow up required             Psychosocial Re-Evaluation:  Psychosocial Re-Evaluation     Dent Name 03/13/22 1304 04/10/22 0915           Psychosocial Re-Evaluation   Current issues with None Identified None Identified      Comments Patient is new to the program. She plans to start 2/27. We will continue to monitor her progress. Pt has completed 3 sessions in the program.  She seems to enjoys the program and wants to improve her overall health.  She continues to have no identifiable psychosocial issues.  We will continue to monitor her progress.      Expected Outcomes Patient will continue to have no psychosocial barriers identified. Patient will continue to have no psychosocial barriers identified.      Interventions Stress management education;Encouraged to attend Pulmonary Rehabilitation for the exercise;Relaxation education Stress management education;Encouraged to attend Pulmonary Rehabilitation for the exercise;Relaxation education      Continue Psychosocial Services  No Follow up required No Follow up required               Psychosocial Discharge (Final Psychosocial Re-Evaluation):  Psychosocial Re-Evaluation - 04/10/22 0915       Psychosocial Re-Evaluation   Current issues with None Identified    Comments Pt has completed 3 sessions in the program.  She seems to enjoys the  program and wants to improve her overall health.  She continues to have no identifiable psychosocial issues.  We will continue to monitor her progress.    Expected Outcomes Patient will continue to have no psychosocial barriers identified.    Interventions Stress management education;Encouraged to attend Pulmonary Rehabilitation for the exercise;Relaxation education    Continue Psychosocial Services  No Follow up required              Education: Education Goals: Education classes will be provided on a weekly basis, covering required topics. Participant will state understanding/return demonstration of topics presented.  Learning Barriers/Preferences:  Learning Barriers/Preferences - 03/12/22 0844       Learning Barriers/Preferences   Learning Barriers None  Learning Preferences Written Material             Education Topics: How Lungs Work and Diseases: - Discuss the anatomy of the lungs and diseases that can affect the lungs, such as COPD.   Exercise: -Discuss the importance of exercise, FITT principles of exercise, normal and abnormal responses to exercise, and how to exercise safely.   Environmental Irritants: -Discuss types of environmental irritants and how to limit exposure to environmental irritants.   Meds/Inhalers and oxygen: - Discuss respiratory medications, definition of an inhaler and oxygen, and the proper way to use an inhaler and oxygen.   Energy Saving Techniques: - Discuss methods to conserve energy and decrease shortness of breath when performing activities of daily living.    Bronchial Hygiene / Breathing Techniques: - Discuss breathing mechanics, pursed-lip breathing technique,  proper posture, effective ways to clear airways, and other functional breathing techniques   Cleaning Equipment: - Provides group verbal and written instruction about the health risks of elevated stress, cause of high stress, and healthy ways to reduce  stress.   Nutrition I: Fats: - Discuss the types of cholesterol, what cholesterol does to the body, and how cholesterol levels can be controlled.   Nutrition II: Labels: -Discuss the different components of food labels and how to read food labels.   Respiratory Infections: - Discuss the signs and symptoms of respiratory infections, ways to prevent respiratory infections, and the importance of seeking medical treatment when having a respiratory infection. Flowsheet Row PULMONARY REHAB OTHER RESPIRATORY from 03/28/2022 in [facility]  Date 03/28/22  Educator HB  Instruction Review Code 1- Verbalizes Understanding       Stress I: Signs and Symptoms: - Discuss the causes of stress, how stress may lead to anxiety and depression, and ways to limit stress.   Stress II: Relaxation: -Discuss relaxation techniques to limit stress.   Oxygen for Home/Travel: - Discuss how to prepare for travel when on oxygen and proper ways to transport and store oxygen to ensure safety.   Knowledge Questionnaire Score:  Knowledge Questionnaire Score - 03/12/22 0844       Knowledge Questionnaire Score   Pre Score 14/18             Core Components/Risk Factors/Patient Goals at Admission:  Personal Goals and Risk Factors at Admission - 03/12/22 1005       Core Components/Risk Factors/Patient Goals on Admission   Tobacco Cessation Yes    Number of packs per day 2-4 cigs per day    Intervention Assist the participant in steps to quit. Provide individualized education and counseling about committing to Tobacco Cessation, relapse prevention, and pharmacological support that can be provided by physician.;Advice worker, assist with locating and accessing local/national Quit Smoking programs, and support quit date choice.    Expected Outcomes Short Term: Will demonstrate readiness to quit, by selecting a quit date.;Short Term: Will quit all tobacco product use,  adhering to prevention of relapse plan.;Long Term: Complete abstinence from all tobacco products for at least 12 months from quit date.    Improve shortness of breath with ADL's Yes    Intervention Provide education, individualized exercise plan and daily activity instruction to help decrease symptoms of SOB with activities of daily living.    Expected Outcomes Short Term: Improve cardiorespiratory fitness to achieve a reduction of symptoms when performing ADLs;Long Term: Be able to perform more ADLs without symptoms or delay the onset of symptoms    Heart Failure  Yes    Intervention Provide a combined exercise and nutrition program that is supplemented with education, support and counseling about heart failure. Directed toward relieving symptoms such as shortness of breath, decreased exercise tolerance, and extremity edema.    Expected Outcomes Short term: Attendance in program 2-3 days a week with increased exercise capacity. Reported lower sodium intake. Reported increased fruit and vegetable intake. Reports medication compliance.;Improve functional capacity of life;Short term: Daily weights obtained and reported for increase. Utilizing diuretic protocols set by physician.;Long term: Adoption of self-care skills and reduction of barriers for early signs and symptoms recognition and intervention leading to self-care maintenance.    Hypertension Yes    Intervention Provide education on lifestyle modifcations including regular physical activity/exercise, weight management, moderate sodium restriction and increased consumption of fresh fruit, vegetables, and low fat dairy, alcohol moderation, and smoking cessation.;Monitor prescription use compliance.    Expected Outcomes Short Term: Continued assessment and intervention until BP is < 140/13mm HG in hypertensive participants. < 130/32mm HG in hypertensive participants with diabetes, heart failure or chronic kidney disease.;Long Term: Maintenance of blood  pressure at goal levels.    Personal Goal Other Yes    Personal Goal To be able to be more active with her 20 year old daughter and to play in the park with her without worrying about being too SOB.    Intervention Attend PR two days per week and begin a home exercise program.    Expected Outcomes Pt will meet stated goals.             Core Components/Risk Factors/Patient Goals Review:   Goals and Risk Factor Review     Row Name 03/13/22 1306 04/10/22 0916           Core Components/Risk Factors/Patient Goals Review   Personal Goals Review Heart Failure;Improve shortness of breath with ADL's;Hypertension;Other;Tobacco Cessation;Weight Management/Obesity Heart Failure;Improve shortness of breath with ADL's;Other;Tobacco Cessation      Review Patient referred to PR with Chronic combined systolic and diastolic HF. She plans to start the program 2/27. Her personal goals for the program are to improve her SOB; be able to be more active with her 15 year old daughter; be able to play in the park with her daughter without worrying about getting SOB. We will continue to monitor her progress as she works towards meeting these goals. Patient has completed 3 sessions in the program.  Her current weight is 111.6 kg.  Her first 3 blood sugars have been WNL to exercise at 118, 123, and 110.  She is continuing to exercise on RA with 02 sats from 97-100%.  Her blood pressure has been WNL.  Her goals for program are to improve her SOB; be able to be more active with her 47 year old daughter; be able to play in the park with her daughter without worrying about getting SOB.  We will continue to monitor her progress as she works toward meeting these goals.      Expected Outcomes Patient will complete the program meeting both personal and program goala. Patient will complete the program meeting both personal and program goala.               Core Components/Risk Factors/Patient Goals at Discharge (Final Review):    Goals and Risk Factor Review - 04/10/22 0916       Core Components/Risk Factors/Patient Goals Review   Personal Goals Review Heart Failure;Improve shortness of breath with ADL's;Other;Tobacco Cessation    Review Patient has  completed 3 sessions in the program.  Her current weight is 111.6 kg.  Her first 3 blood sugars have been WNL to exercise at 118, 123, and 110.  She is continuing to exercise on RA with 02 sats from 97-100%.  Her blood pressure has been WNL.  Her goals for program are to improve her SOB; be able to be more active with her 8 year old daughter; be able to play in the park with her daughter without worrying about getting SOB.  We will continue to monitor her progress as she works toward meeting these goals.    Expected Outcomes Patient will complete the program meeting both personal and program goala.             ITP Comments:  ITP Comments     Row Name 04/15/22 1539           ITP Comments Left pt a VM regarding her absences from PR                Comments: Marland KitchenMarland KitchenITP REVIEW Pt is making expected progress toward pulmonary rehab goals after completing 4 sessions. Recommend continued exercise, life style modification, education, and utilization of breathing techniques to increase stamina and strength and decrease shortness of breath with exertion.

## 2022-04-18 ENCOUNTER — Encounter (HOSPITAL_COMMUNITY): Payer: Medicare HMO

## 2022-04-22 ENCOUNTER — Ambulatory Visit: Payer: Medicare HMO | Attending: Cardiovascular Disease | Admitting: Cardiovascular Disease

## 2022-04-22 NOTE — Progress Notes (Deleted)
Electrophysiology Office Note:    Date:  04/22/2022   ID:  [name] [name], DOB 07-Sep-1975, MRN [id number]  PCP:  Carrolyn Meiers, [facility] Cardiologist:  Carlyle Dolly, MD Electrophysiologist:  Melida Quitter, MD     Referring MD: Carrolyn Meiers*    History of Present Illness:    [name] [name] is a 47 y.o. female with a hx relevant for CHFrEF, sleep apnea, unprovoked saddle pulmonary embolism in 2015, unprovoked DVT in 2023 (after missing doses of anticoagulation), presence of IVC filter, pulmonary hypertension, moderate severe MR, nonobstructive CAD referred for arrhythmia management.  She has a complex medical history.  Reviewed the note by the referring provider, Melina Copa, from 11/13/2021 that outlines the patient's clinical course. In brief, the patient has a history of depressed EF, recently < 35%. She has not been able to tolerate GDMT in the past due to low Bps. She had an episode of syncope that occurred while she was battling prolonged nausea with dry heaves.  A 30-day monitor was placed that showed nonsustained VT. Otherwise, she has not had any syncope.  Her primary complaint is tenderness and swelling of the right breast that began over the weekend. She was evaluated in the ER and referred for outpatient management with her gynecologist. Otherwise, her exertional capacity and work of breathing is stable.  She is here today to follow-up after her TTE.  Past Medical History:  Diagnosis Date   Anemia    Blood transfusion without reported diagnosis    CAD (coronary artery disease)    Nonobstructive   Closed left ankle fracture    August 30 2012   Clotting disorder [facility])    DVT (deep venous thrombosis) (HCC)    L leg   Fibroid tumor    HFrEF (heart failure with reduced ejection fraction) (HCC)    Hidradenitis    Hypertension    Mitral regurgitation    NICM (nonischemic cardiomyopathy) (HCC)    NSVT (nonsustained  ventricular tachycardia) (HCC)    Obesity    OSA on CPAP    Pulmonary embolism ([place]) 04/2013   Sleep apnea     Past Surgical History:  Procedure Laterality Date   ABSCESS DRAINAGE Bilateral 01/10/2015   BREAST BIOPSY Right 02/01/2020   benign   COLONOSCOPY WITH PROPOFOL N/A 06/08/2018   Procedure: COLONOSCOPY WITH PROPOFOL;  Surgeon: Daneil Dolin, MD;  Location[facility];  Service: Endoscopy;  Laterality: N/A;  2:30pm   CYST REMOVAL TRUNK     IVC FILTER PLACEMENT ([place] [facility])     LOWER EXTREMITY VENOGRAPHY N/A 02/07/2021   Procedure: LOWER EXTREMITY VENOGRAPHY;  Surgeon: [name], MD;  Location: [place] [facility] LAB;  Service: Cardiovascular;  Laterality: N/A;   PERIPHERAL VASCULAR THROMBECTOMY N/A 02/07/2021   Procedure: PERIPHERAL VASCULAR THROMBECTOMY;  Surgeon: [name], MD;  Location: [place] CV LAB;  Service: Cardiovascular;  Laterality: N/A;   RIGHT HEART CATH N/A 03/08/2022   Procedure: RIGHT HEART CATH;  Surgeon: Hebert Soho, DO;  Location: [place] CV LAB;  Service: Cardiovascular;  Laterality: N/A;   RIGHT/LEFT HEART CATH AND CORONARY ANGIOGRAPHY N/A 06/19/2021   Procedure: RIGHT/LEFT HEART CATH AND CORONARY ANGIOGRAPHY;  Surgeon: Belva Crome, MD;  Location: [place] CV LAB;  Service: Cardiovascular;  Laterality: N/A;   TEE WITHOUT CARDIOVERSION N/A 03/08/2022   Procedure: TRANSESOPHAGEAL ECHOCARDIOGRAM (TEE);  Surgeon: Hebert Soho, DO;  Location: [facility];  Service: Cardiovascular;  Laterality: N/A;    Current Medications: No outpatient medications have been marked as taking for the 04/22/22 encounter (Appointment) with Sylwia Cuervo, Yetta [facility], MD.     Allergies:   Sulfa antibiotics   Social History   Socioeconomic History   Marital status: Widowed    Spouse name: Not on file   Number of children: Not on file   Years of education: Not on file   Highest education level: Not on file  Occupational History   Not on file   Tobacco Use   Smoking status: Every Day    Years: 18    Types: Cigarettes   Smokeless tobacco: Never   Tobacco comments:    Smokes 2-3 cigarettes daily as of 02/13/22  Vaping Use   Vaping Use: Never used  Substance and Sexual Activity   Alcohol use: Not Currently    Alcohol/week: 2.0 standard drinks of alcohol    Types: 2 Cans of beer per week    Comment: twice a month   Drug use: No   Sexual activity: Not Currently    Birth control/protection: Pill, None  Other Topics Concern   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    Has a 46 year old [place].   Live with parents.   Was working 5 days a weeks.   Not working right now.    Attends church.    Social Determinants of Health   Financial Resource Strain: Not on file  Food Insecurity: No Food Insecurity (02/13/2022)   Hunger Vital Sign    Worried About Running Out of Food in the Last Year: Never true    Ran Out of Food in the Last Year: Never true  Transportation Needs: No Transportation Needs (02/13/2022)   PRAPARE - Hydrologist (Medical): No    Lack of Transportation (Non-Medical): No  Physical Activity: Not on file  Stress: Not on file  Social Connections: Not on file     Family History: The patient's family history includes Cancer in her maternal grandmother; Diabetes in her paternal aunt and paternal uncle; Heart attack in her paternal grandmother; Hypertension in her mother; Other in her father. There is no history of Colon cancer.  ROS:   Please see the history of present illness.    All other systems reviewed and are negative.  EKGs/Labs/Other Studies Reviewed Today:     30 Day monitor 11/2021   30 day monitor. Data available from 75% of planned monitored time   Min HR 61, Max HR 159, Avg HR 81   Symptoms correlated with sinus rhythm, rare PVCs.   Short runs of SVT at times with aberrancy without symptoms reported. Brief  NSVT no reported symptoms Frequent aberrancy  EKG:  Last EKG results: today-  sinus. IRBBB, lateral infarct   Recent Labs: 11/14/2021: TSH 3.122 02/13/2022: ALT 15 02/16/2022: Magnesium 2.1 03/11/2022: B Natriuretic Peptide 1,854.6 03/12/2022: BUN 20; Creatinine, Ser 1.03; Hemoglobin 10.3; Platelets 256; Potassium 3.8; Sodium 138     Physical Exam:    VS:  There were no vitals taken for this visit.    Wt Readings from Last 3 Encounters:  03/29/22 242 lb (109.8 kg)  03/12/22 264 lb 12.4 oz (120.1 kg)  03/11/22 263 lb 3.2 oz (119.4 kg)     GEN:  Well nourished, well developed in no acute distress, obese CARDIAC: RRR, no murmurs, rubs, gallops RESPIRATORY:  Normal work of breathing MUSCULOSKELETAL: slight dependent edema Right breast is markedly larger than left.   ASSESSMENT & PLAN:    Syncope: in setting of gastritis, very likely vasovagal.  NSVT: continue routine medical management of CHF, betablocker. In the absence of significant symptoms of sustained VT, I do not think she would benefit substantially from an EP study. CHFrEF: EF improved to > 35% on metoprolol, losartan, and spironolactone. Since her EF has improved, ICD is not indicated.      Signed, Melida Quitter, MD  04/22/2022 5:35 AM    [place]

## 2022-04-23 ENCOUNTER — Encounter (HOSPITAL_COMMUNITY): Payer: Medicare HMO

## 2022-04-23 NOTE — Addendum Note (Signed)
Encounter addended by: Philis Kendall on: 04/23/2022 11:27 AM  Actions taken: Flowsheet accepted

## 2022-04-23 NOTE — Progress Notes (Signed)
Discharge Progress Report  Patient Details  Name: [name] [name] MRN: [id number] Date of Birth: 01/05/1976 Referring Provider:   Flowsheet Row PULMONARY REHAB OTHER RESP ORIENTATION from 03/12/2022 in [place]  Referring Provider Dr. Daniel Nones        Number of Visits: 4  Reason for Discharge:  Early Exit:  Lack of attendance  Smoking History:  Social History   Tobacco Use  Smoking Status Every Day   Years: 18   Types: Cigarettes  Smokeless Tobacco Never  Tobacco Comments   Smokes 2-3 cigarettes daily as of 02/13/22    Diagnosis:  Chronic combined systolic and diastolic CHF (congestive heart failure) ([place])  ADL UCSD:  Pulmonary Assessment Scores     Row Name 03/12/22 0830         ADL UCSD   ADL Phase Entry     SOB Score total 60     Rest 0     Walk 2     Stairs 3     Bath 2     Dress 3     Shop 3       CAT Score   CAT Score 18       mMRC Score   mMRC Score 3              Initial Exercise Prescription:  Initial Exercise Prescription - 03/12/22 0900       Date of Initial Exercise RX and Referring Provider   Date 03/12/22    Referring Provider Dr. Daniel Nones    Expected Discharge Date 07/18/22      NuStep   Level 1    SPM 80    Minutes 39      Prescription Details   Frequency (times per week) 2    Duration Progress to 30 minutes of continuous aerobic without signs/symptoms of physical distress      Intensity   THRR 40-80% of Max Heartrate 70-140    Ratings of Perceived Exertion 11-13    Perceived Dyspnea 0-4      Resistance Training   Training Prescription Yes    Weight 3    Reps 10-15             Discharge Exercise Prescription (Final Exercise Prescription Changes):  Exercise Prescription Changes - 03/28/22 1200       Response to Exercise   Blood Pressure (Admit) 100/62    Blood Pressure (Exercise) 118/70    Blood Pressure (Exit) 110/60    Heart Rate (Admit) 83 bpm    Heart Rate (Exercise) 97  bpm    Heart Rate (Exit) 82 bpm    Oxygen Saturation (Admit) 98 %    Oxygen Saturation (Exercise) 98 %    Oxygen Saturation (Exit) 98 %    Rating of Perceived Exertion (Exercise) 11    Perceived Dyspnea (Exercise) 11    Duration Continue with 30 min of aerobic exercise without signs/symptoms of physical distress.    Intensity THRR unchanged      Progression   Progression Continue to progress workloads to maintain intensity without signs/symptoms of physical distress.      Resistance Training   Training Prescription Yes    Weight 3    Reps 10-15    Time 10 Minutes      NuStep   Level 3    SPM 106    Minutes 39    METs 2.6             Functional  Capacity:  6 Minute Walk     Row Name 03/12/22 0954         6 Minute Walk   Distance 800 feet     Walk Time 6 minutes     # of Rest Breaks 2     MPH 1.52     METS 2.6     RPE 11     Perceived Dyspnea  13     VO2 Peak 9.11     Symptoms Yes (comment)     Comments 2 standing rest breaks for 30 sec each due to SOB     Resting HR 65 bpm     Resting BP 98/64     Resting Oxygen Saturation  100 %     Exercise Oxygen Saturation  during 6 min walk 96 %     Max Ex. HR 91 bpm     Max Ex. BP 108/72     2 Minute Post BP 96/70       Interval HR   1 Minute HR 82     2 Minute HR 88     3 Minute HR 89     4 Minute HR 90     5 Minute HR 85     6 Minute HR 91     2 Minute Post HR 84     Interval Heart Rate? Yes       Interval Oxygen   Interval Oxygen? Yes     Baseline Oxygen Saturation % 100 %     1 Minute Oxygen Saturation % 96 %     1 Minute Liters of Oxygen 0 L     2 Minute Oxygen Saturation % 97 %     2 Minute Liters of Oxygen 0 L     3 Minute Oxygen Saturation % 100 %     3 Minute Liters of Oxygen 0 L     4 Minute Oxygen Saturation % 99 %     4 Minute Liters of Oxygen 0 L     5 Minute Oxygen Saturation % 100 %     5 Minute Liters of Oxygen 0 L     6 Minute Oxygen Saturation % 100 %     6 Minute Liters of Oxygen  0 L     2 Minute Post Oxygen Saturation % 98 %     2 Minute Post Liters of Oxygen 0 L              Psychological, QOL, Others - Outcomes: PHQ 2/9:    03/12/2022    8:34 AM 01/24/2022    8:39 AM 03/25/2018    9:25 AM 03/11/2018    9:15 AM 01/28/2018   10:43 AM  Depression screen PHQ 2/9  Decreased Interest 1 0 0 0 0  Down, Depressed, Hopeless 0 0 0 0 0  PHQ - 2 Score 1 0 0 0 0  Altered sleeping 0      Tired, decreased energy 1      Change in appetite 0      Feeling bad or failure about yourself  0      Trouble concentrating 0      Moving slowly or fidgety/restless 0      Suicidal thoughts 0      PHQ-9 Score 2      Difficult doing work/chores Somewhat difficult        Quality of Life:  Quality of Life - 03/12/22 [id number]  Quality of Life   Select Quality of Life      Quality of Life Scores   Health/Function Pre 20.57 %    Socioeconomic Pre 22.2 %    Psych/Spiritual Pre 26.36 %    Family Pre 25.5 %    GLOBAL Pre 22.86 %             Personal Goals: Goals established at orientation with interventions provided to work toward goal.  Personal Goals and Risk Factors at Admission - 03/12/22 1005       Core Components/Risk Factors/Patient Goals on Admission   Tobacco Cessation Yes    Number of packs per day 2-4 cigs per day    Intervention Assist the participant in steps to quit. Provide individualized education and counseling about committing to Tobacco Cessation, relapse prevention, and pharmacological support that can be provided by physician.;Advice worker, assist with locating and accessing local/national Quit Smoking programs, and support quit date choice.    Expected Outcomes Short Term: Will demonstrate readiness to quit, by selecting a quit date.;Short Term: Will quit all tobacco product use, adhering to prevention of relapse plan.;Long Term: Complete abstinence from all tobacco products for at least 12 months from quit date.    Improve shortness  of breath with ADL's Yes    Intervention Provide education, individualized exercise plan and daily activity instruction to help decrease symptoms of SOB with activities of daily living.    Expected Outcomes Short Term: Improve cardiorespiratory fitness to achieve a reduction of symptoms when performing ADLs;Long Term: Be able to perform more ADLs without symptoms or delay the onset of symptoms    Heart Failure Yes    Intervention Provide a combined exercise and nutrition program that is supplemented with education, support and counseling about heart failure. Directed toward relieving symptoms such as shortness of breath, decreased exercise tolerance, and extremity edema.    Expected Outcomes Short term: Attendance in program 2-3 days a week with increased exercise capacity. Reported lower sodium intake. Reported increased fruit and vegetable intake. Reports medication compliance.;Improve functional capacity of life;Short term: Daily weights obtained and reported for increase. Utilizing diuretic protocols set by physician.;Long term: Adoption of self-care skills and reduction of barriers for early signs and symptoms recognition and intervention leading to self-care maintenance.    Hypertension Yes    Intervention Provide education on lifestyle modifcations including regular physical activity/exercise, weight management, moderate sodium restriction and increased consumption of fresh fruit, vegetables, and low fat dairy, alcohol moderation, and smoking cessation.;Monitor prescription use compliance.    Expected Outcomes Short Term: Continued assessment and intervention until BP is < 140/31mm HG in hypertensive participants. < 130/22mm HG in hypertensive participants with diabetes, heart failure or chronic kidney disease.;Long Term: Maintenance of blood pressure at goal levels.    Personal Goal Other Yes    Personal Goal To be able to be more active with her 42 year old daughter and to play in the park with her  without worrying about being too SOB.    Intervention Attend PR two days per week and begin a home exercise program.    Expected Outcomes Pt will meet stated goals.              Personal Goals Discharge:  Goals and Risk Factor Review     Row Name 03/13/22 1306 04/10/22 0916           Core Components/Risk Factors/Patient Goals Review   Personal Goals Review Heart Failure;Improve shortness of breath with  ADL's;Hypertension;Other;Tobacco Cessation;Weight Management/Obesity Heart Failure;Improve shortness of breath with ADL's;Other;Tobacco Cessation      Review Patient referred to PR with Chronic combined systolic and diastolic HF. She plans to start the program 2/27. Her personal goals for the program are to improve her SOB; be able to be more active with her 63 year old daughter; be able to play in the park with her daughter without worrying about getting SOB. We will continue to monitor her progress as she works towards meeting these goals. Patient has completed 3 sessions in the program.  Her current weight is 111.6 kg.  Her first 3 blood sugars have been WNL to exercise at 118, 123, and 110.  She is continuing to exercise on RA with 02 sats from 97-100%.  Her blood pressure has been WNL.  Her goals for program are to improve her SOB; be able to be more active with her 81 year old daughter; be able to play in the park with her daughter without worrying about getting SOB.  We will continue to monitor her progress as she works toward meeting these goals.      Expected Outcomes Patient will complete the program meeting both personal and program goala. Patient will complete the program meeting both personal and program goala.               Exercise Goals and Review:  Exercise Goals     Row Name 03/12/22 [id number] 03/19/22 1434 04/16/22 1317 04/16/22 1428       Exercise Goals   Increase Physical Activity Yes Yes Yes Yes    Intervention Provide advice, education, support and counseling about  physical activity/exercise needs.;Develop an individualized exercise prescription for aerobic and resistive training based on initial evaluation findings, risk stratification, comorbidities and participant's personal goals. Provide advice, education, support and counseling about physical activity/exercise needs.;Develop an individualized exercise prescription for aerobic and resistive training based on initial evaluation findings, risk stratification, comorbidities and participant's personal goals. Provide advice, education, support and counseling about physical activity/exercise needs.;Develop an individualized exercise prescription for aerobic and resistive training based on initial evaluation findings, risk stratification, comorbidities and participant's personal goals. Provide advice, education, support and counseling about physical activity/exercise needs.;Develop an individualized exercise prescription for aerobic and resistive training based on initial evaluation findings, risk stratification, comorbidities and participant's personal goals.    Expected Outcomes Short Term: Attend rehab on a regular basis to increase amount of physical activity.;Long Term: Add in home exercise to make exercise part of routine and to increase amount of physical activity.;Long Term: Exercising regularly at least 3-5 days a week. Short Term: Attend rehab on a regular basis to increase amount of physical activity.;Long Term: Add in home exercise to make exercise part of routine and to increase amount of physical activity.;Long Term: Exercising regularly at least 3-5 days a week. Short Term: Attend rehab on a regular basis to increase amount of physical activity.;Long Term: Add in home exercise to make exercise part of routine and to increase amount of physical activity.;Long Term: Exercising regularly at least 3-5 days a week. Short Term: Attend rehab on a regular basis to increase amount of physical activity.;Long Term: Add in  home exercise to make exercise part of routine and to increase amount of physical activity.;Long Term: Exercising regularly at least 3-5 days a week.    Increase Strength and Stamina Yes Yes -- Yes    Intervention Provide advice, education, support and counseling about physical activity/exercise needs.;Develop an individualized exercise prescription for  aerobic and resistive training based on initial evaluation findings, risk stratification, comorbidities and participant's personal goals. Provide advice, education, support and counseling about physical activity/exercise needs.;Develop an individualized exercise prescription for aerobic and resistive training based on initial evaluation findings, risk stratification, comorbidities and participant's personal goals. -- Provide advice, education, support and counseling about physical activity/exercise needs.;Develop an individualized exercise prescription for aerobic and resistive training based on initial evaluation findings, risk stratification, comorbidities and participant's personal goals.    Expected Outcomes Short Term: Increase workloads from initial exercise prescription for resistance, speed, and METs.;Short Term: Perform resistance training exercises routinely during rehab and add in resistance training at home;Long Term: Improve cardiorespiratory fitness, muscular endurance and strength as measured by increased METs and functional capacity (6MWT) Short Term: Increase workloads from initial exercise prescription for resistance, speed, and METs.;Short Term: Perform resistance training exercises routinely during rehab and add in resistance training at home;Long Term: Improve cardiorespiratory fitness, muscular endurance and strength as measured by increased METs and functional capacity (6MWT) -- Short Term: Increase workloads from initial exercise prescription for resistance, speed, and METs.;Short Term: Perform resistance training exercises routinely during  rehab and add in resistance training at home;Long Term: Improve cardiorespiratory fitness, muscular endurance and strength as measured by increased METs and functional capacity (6MWT)    Able to understand and use rate of perceived exertion (RPE) scale Yes Yes -- Yes    Intervention Provide education and explanation on how to use RPE scale Provide education and explanation on how to use RPE scale -- Provide education and explanation on how to use RPE scale    Expected Outcomes Short Term: Able to use RPE daily in rehab to express subjective intensity level;Long Term:  Able to use RPE to guide intensity level when exercising independently Short Term: Able to use RPE daily in rehab to express subjective intensity level;Long Term:  Able to use RPE to guide intensity level when exercising independently -- Short Term: Able to use RPE daily in rehab to express subjective intensity level;Long Term:  Able to use RPE to guide intensity level when exercising independently    Able to understand and use Dyspnea scale Yes Yes -- Yes    Intervention Provide education and explanation on how to use Dyspnea scale Provide education and explanation on how to use Dyspnea scale -- Provide education and explanation on how to use Dyspnea scale    Expected Outcomes Short Term: Able to use Dyspnea scale daily in rehab to express subjective sense of shortness of breath during exertion;Long Term: Able to use Dyspnea scale to guide intensity level when exercising independently Short Term: Able to use Dyspnea scale daily in rehab to express subjective sense of shortness of breath during exertion;Long Term: Able to use Dyspnea scale to guide intensity level when exercising independently -- Short Term: Able to use Dyspnea scale daily in rehab to express subjective sense of shortness of breath during exertion;Long Term: Able to use Dyspnea scale to guide intensity level when exercising independently    Knowledge and understanding of Target  Heart Rate Range (THRR) Yes Yes -- Yes    Intervention Provide education and explanation of THRR including how the numbers were predicted and where they are located for reference Provide education and explanation of THRR including how the numbers were predicted and where they are located for reference -- Provide education and explanation of THRR including how the numbers were predicted and where they are located for reference    Expected Outcomes Short  Term: Able to state/look up THRR;Short Term: Able to use daily as guideline for intensity in rehab;Long Term: Able to use THRR to govern intensity when exercising independently Short Term: Able to state/look up THRR;Short Term: Able to use daily as guideline for intensity in rehab;Long Term: Able to use THRR to govern intensity when exercising independently -- Short Term: Able to state/look up THRR;Short Term: Able to use daily as guideline for intensity in rehab;Long Term: Able to use THRR to govern intensity when exercising independently    Understanding of Exercise Prescription Yes Yes -- Yes    Intervention Provide education, explanation, and written materials on patient's individual exercise prescription Provide education, explanation, and written materials on patient's individual exercise prescription -- Provide education, explanation, and written materials on patient's individual exercise prescription    Expected Outcomes Short Term: Able to explain program exercise prescription;Long Term: Able to explain home exercise prescription to exercise independently Short Term: Able to explain program exercise prescription;Long Term: Able to explain home exercise prescription to exercise independently -- Short Term: Able to explain program exercise prescription;Long Term: Able to explain home exercise prescription to exercise independently             Exercise Goals Re-Evaluation:  Exercise Goals Re-Evaluation     [place] Name 03/19/22 1434 04/16/22 1428            Exercise Goal Re-Evaluation   Exercise Goals Review -- Increase Physical Activity;Increase Strength and Stamina;Able to understand and use rate of perceived exertion (RPE) scale;Able to understand and use Dyspnea scale;Knowledge and understanding of Target Heart Rate Range (THRR);Understanding of Exercise Prescription      Comments Pt has not shown for her first session. Will update when able. Pt has completed 3 sessions of PR.She has not been back since 3/07 and has not called. She seemed motivated when coming and egar to be in the program. She was exercising at 2.6 METs on the stepper. Will update when able.      Expected Outcomes -- Through exercise at home and at rehab, the patient will meet their stated goals.               Nutrition & Weight - Outcomes:  Pre Biometrics - 03/12/22 0959       Pre Biometrics   Height 5\' 11"  (1.803 m)    Weight 264 lb 12.4 oz (120.1 kg)    Waist Circumference 46.5 inches    Hip Circumference 51 inches    Waist to Hip Ratio 0.91 %    BMI (Calculated) 36.94    Triceps Skinfold 26 mm    % Body Fat 44.5 %    Grip Strength 32.5 kg    Flexibility 0 in    Single Leg Stand 60 seconds              Nutrition:  Nutrition Therapy & Goals - 03/13/22 1305       Personal Nutrition Goals   Comments Patient scored 27 on diet assessment. We provide educational sessions on heart healthy nutrition with handouts and assistance with RD referrals if patient is interested.      Intervention Plan   Intervention Nutrition handout(s) given to patient.    Expected Outcomes Short Term Goal: Understand basic principles of dietary content, such as calories, fat, sodium, cholesterol and nutrients.             Nutrition Discharge:  Nutrition Assessments - 03/12/22 0841       MEDFICTS Scores  Pre Score 57             Education Questionnaire Score:  Knowledge Questionnaire Score - 03/12/22 0844       Knowledge Questionnaire Score    Pre Score 14/18             Pt discharged from PR after 4 sessions due to lack of attenance. Several attempts have been made to reach her without success.

## 2022-04-23 NOTE — Addendum Note (Signed)
Encounter addended by: Philis Kendall on: 04/23/2022 1:28 PM  Actions taken: Episode resolved, Flowsheet accepted, Clinical Note Signed

## 2022-04-25 ENCOUNTER — Encounter: Payer: Self-pay | Admitting: Cardiovascular Disease

## 2022-04-25 ENCOUNTER — Encounter (HOSPITAL_COMMUNITY): Payer: Medicare HMO

## 2022-04-29 ENCOUNTER — Ambulatory Visit: Payer: Medicare HMO | Attending: Cardiovascular Disease | Admitting: Cardiovascular Disease

## 2022-04-30 ENCOUNTER — Encounter (HOSPITAL_COMMUNITY): Payer: Medicare HMO

## 2022-05-02 ENCOUNTER — Encounter (HOSPITAL_COMMUNITY): Payer: Medicare HMO

## 2022-05-06 ENCOUNTER — Ambulatory Visit: Payer: Medicare HMO | Admitting: Nurse Practitioner

## 2022-05-06 NOTE — Progress Notes (Deleted)
Cardiology Office Note:    Date:  03/29/2022  ID:  [name] [name], DOB [birth year], MRN [id number]  PCP:  [name] [name], [place] [facility] Cardiologist:  Carlyle Dolly, MD Electrophysiologist:  Melida Quitter, MD      Referring MD: [name] [name]*   CC: Here for follow-up  History of Present Illness:    [name] [name] is a 47 y.o. 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 Harl Bowie in 2019 for PE in 2015 to get set up with Coumadin clinic.  In January 2023, had recurrent left lower extremity DVT.  CTA was checked for PE, negative.  Admitted in May 2023 for CAP CHF, worsening anemia requiring 3 units of PRBC, TTE revealed mildly reduced EF at 45 to 50% requiring diuresis.    Readmitted 05/2021 with respiratory failure requiring intubation, was felt to be likely combination of CHF and pneumonia. RHC in 05/2021 revealed 40 to 50% pRCA narrowing, otherwise normal coronary arteries.  LVEDP elevated, consistent with acute on chronic combined CHF. BP's were soft during admission, therefore limiting medication therapy.  TTE 08/2021 revealed EF 30 to 35%.    Admitted 09/2021 with syncope, nausea, dizziness with subsequent C2 fracture. Was leaning over truck, felt nauseous, face planted down and went completely out.  No surgery recommended. TTE revealed EF 30 to 35%, mild LVH, mildly elevated PASP, mildly reduced RVSF, mild RVE, moderate LAE, mild RAE, moderate to severe MR, mild to moderate TR. Syncope felt to be vasovagal.  Preliminary monitor report revealed NSR, occasional NSVT (max lasting 9 beats), 4% PVCs, and less than 1% PACs.   Presented to [facility] on 12/28/21 for  right breast pain and swelling, endorsed drainage from nipple. Coumadin was held, was bridged with heparin, and underwent punch biopsy by general surgery on 12/29/21. Pathology showed benign breast parenchyma with acute and chronic inflammation. Ultrasound showed retro areolar abscess and underwent aspiration on 01/01/2022. Hospital course complicated by IDA, required 2 units of PRBC for low hemoglobin. BNP was elevated at 1232, found to be having acute CHF exacerbation. Noted lower extremity swelling during hospitalization. Titration of GDMT was limited d/t baseline hypotension during hospitalization, some antihypertensives were held. Experienced left thigh pain during hospitalization.  CT was negative for fluid collection or hematoma.     Presented back to [facility] 01/2022 for acute on chronic HFrEF. Repeat Echo showed EF 35-40%, low normal RVF, severe MR, mild to mod TR. She endorsed some indiscretion with fluid intake. Viral panel came back positive for Coronavirus OC43. CXR without consolidation.   Underwent RHC on 03/08/2022 that revealed severely elevated pre and postcapillary filling pressures, mild to moderately elevated PA mean and PVR secondary to combined pre and postcapillary pulmonary hypertension, moderately reduced CI recommended to increase outpatient diuretics and start evaluation for mitral valve repair/replacement.  Repeat TTE 02/2022 revealed akinesis of inferolateral wall with overall moderate to severe LV dysfunction, EF 30 to 35%, restricted posterior MV leaflet with severe eccentric MR, no evidence of MS, severe MR. TEE results below.   Today she presents for follow-up.  Doing well.  Denies any acute cardiac complaints or issues. Has lost 74 lbs since I first saw her. Denies  any chest pain, shortness of breath, palpitations, syncope, presyncope, dizziness, orthopnea, PND, swelling or significant weight changes, acute bleeding, or claudication. Tolerating her medications well.  Says she has an  appointment on April 29, 2022 with the structural heart team for evaluation for possible upcoming mitral valve repair.  Past Medical History:  Diagnosis Date   Anemia    Blood transfusion without reported diagnosis    CAD (coronary artery disease)    Nonobstructive   Closed left ankle fracture    August 30 2012   Clotting disorder [facility])    DVT (deep venous thrombosis) (HCC)    L leg   Fibroid tumor    HFrEF (heart failure with reduced ejection fraction) (HCC)    Hidradenitis    Hypertension    Mitral regurgitation    NICM (nonischemic cardiomyopathy) (HCC)    NSVT (nonsustained ventricular tachycardia) (HCC)    Obesity    OSA on CPAP    Pulmonary embolism ([place]) 04/2013   Sleep apnea     Past Surgical History:  Procedure Laterality Date   ABSCESS DRAINAGE Bilateral 01/10/2015   BREAST BIOPSY Right 02/01/2020   benign   COLONOSCOPY WITH PROPOFOL N/A 06/08/2018   Procedure: COLONOSCOPY WITH PROPOFOL;  Surgeon: Daneil Dolin, MD;  Location: [facility];  Service: Endoscopy;  Laterality: N/A;  2:30pm   CYST REMOVAL TRUNK     IVC FILTER PLACEMENT ([place] HX)     LOWER EXTREMITY VENOGRAPHY N/A 02/07/2021   Procedure: LOWER EXTREMITY VENOGRAPHY;  Surgeon: [name], MD;  Location: [place] CV LAB;  Service: Cardiovascular;  Laterality: N/A;   PERIPHERAL VASCULAR THROMBECTOMY N/A 02/07/2021   Procedure: PERIPHERAL VASCULAR THROMBECTOMY;  Surgeon: [name], MD;  Location: [place] CV LAB;  Service: Cardiovascular;  Laterality: N/A;   RIGHT HEART CATH N/A 03/08/2022   Procedure: RIGHT HEART CATH;  Surgeon: Hebert Soho, DO;  Location: [place] CV LAB;  Service: Cardiovascular;  Laterality: N/A;   RIGHT/LEFT HEART CATH AND CORONARY ANGIOGRAPHY N/A 06/19/2021   Procedure: RIGHT/LEFT HEART CATH AND CORONARY ANGIOGRAPHY;  Surgeon: Belva Crome, MD;  Location: [place] [facility] LAB;  Service: Cardiovascular;  Laterality: N/A;   TEE WITHOUT CARDIOVERSION N/A  03/08/2022   Procedure: TRANSESOPHAGEAL ECHOCARDIOGRAM (TEE);  Surgeon: Hebert Soho, DO;  Location: [facility];  Service: Cardiovascular;  Laterality: N/A;    Current Medications: Current Meds  Medication Sig   albuterol (VENTOLIN HFA) 108 (90 Base) MCG/ACT inhaler Inhale 2 puffs into the lungs every 2 (two) hours as needed for wheezing or shortness of breath.   atorvastatin (LIPITOR) 20 MG tablet Take 1 tablet (20 mg total) by mouth daily.   empagliflozin (JARDIANCE) 10 MG TABS tablet Take 1 tablet (10 mg total) by mouth daily before breakfast.   furosemide (LASIX) 40 MG tablet Take 40 mg by mouth 2 (two) times daily.   losartan (COZAAR) 25 MG tablet Take 0.5 tablets (12.5 mg total) by mouth daily.   Magnesium Oxide 400 MG CAPS Take 1 capsule (400 mg total) by mouth daily.   metoprolol succinate (TOPROL-XL) 25 MG 24 hr tablet Take 1 tablet (25 mg total) by mouth daily. Take with or immediately following a meal.   potassium chloride 20 MEQ TBCR Take 20 mEq by mouth 2 (two) times daily.   spironolactone (ALDACTONE) 25 MG tablet Take 0.5 tablets (12.5 mg total) by mouth daily.   warfarin (COUMADIN) 10 MG tablet Take 5-10 mg by mouth See admin instructions. Take as  directed in the evening : Monday take 10 mg and all other days take 5 mg.   warfarin (COUMADIN) 5 MG tablet TAKE (1) OR (1) 1/2 TABLET BY MOUTH DAILY AS DIRECTED.    midodrine (PROAMATINE) 2.5 MG tablet Take 1 tablet (2.5 mg total) by mouth 2 (two) times daily with a meal.     Allergies:   Sulfa antibiotics   Social History   Socioeconomic History   Marital status: Widowed    Spouse name: Not on file   Number of children: Not on file   Years of education: Not on file   Highest education level: Not on file  Occupational History   Not on file  Tobacco Use   Smoking status: Every Day    Years: 18    Types: Cigarettes   Smokeless tobacco: Never   Tobacco comments:    Smokes 2-3 cigarettes daily as of 02/13/22  Vaping  Use   Vaping Use: Never used  Substance and Sexual Activity   Alcohol use: Not Currently    Alcohol/week: 2.0 standard drinks of alcohol    Types: 2 Cans of beer per week    Comment: twice a month   Drug use: No   Sexual activity: Not Currently    Birth control/protection: Pill, None  Other Topics Concern   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    Has a 46 year old Aviauna.   Live with parents.   Was working 5 days a weeks.   Not working right now.    Attends church.    Social Determinants of Health   Financial Resource Strain: Not on file  Food Insecurity: No Food Insecurity (02/13/2022)   Hunger Vital Sign    Worried About Running Out of Food in the Last Year: Never true    Ran Out of Food in the Last Year: Never true  Transportation Needs: No Transportation Needs (02/13/2022)   PRAPARE - Administrator, Civil Service (Medical): No    Lack of Transportation (Non-Medical): No  Physical Activity: Not on file  Stress: Not on file  Social Connections: Not on file     Family History: The patient's family history includes Cancer in her maternal grandmother; Diabetes in her paternal aunt and paternal uncle; Heart attack in her paternal grandmother; Hypertension in her mother; Other in her father. There is no history of Colon cancer.  ROS:    Please see the history of present illness.    All other systems reviewed and are negative.  EKGs/Labs/Other Studies Reviewed:    The following studies were reviewed today:   EKG:  EKG is not ordered today.    TTE on 03/14/2022:  1. Akinesis of the inferolateral wall with overall moderate to severe LV  dysfunction; restricted posterior MV leaflet with severe, eccentric MR.   2. Left ventricular ejection fraction, by estimation, is 30 to 35%. The  left ventricle has moderately decreased function. The left ventricle  demonstrates regional wall motion abnormalities  (see scoring  diagram/findings for description). The left  ventricular internal cavity size was severely dilated. Left ventricular  diastolic function could not be evaluated.   3. Right ventricular systolic function is moderately reduced. The right  ventricular size is normal. There is severely elevated pulmonary artery  systolic pressure.   4. Left atrial size was moderately dilated.   5. The mitral valve is normal in  structure. Severe mitral valve  regurgitation. No evidence of mitral stenosis.   6. The aortic valve is tricuspid. Aortic valve regurgitation is not  visualized. Aortic valve sclerosis is present, with no evidence of aortic  valve stenosis.   7. The inferior vena cava is dilated in size with <50% respiratory  variability, suggesting right atrial pressure of 15 mmHg.  RHC on 03/08/2022:  IMPRESSION: Severely elevated pre and post capillary filling pressures.  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    RECOMMENDATIONS: - Increase outpatient diuretics.  - Start evaluation for mitral valve repair/replacement.   TEE on 03/08/2022:   1. Left ventricular ejection fraction, by estimation, is 35%. The left  ventricle has moderately decreased function. The left ventricular internal  cavity size was moderately to severely dilated measuring 7.1cm.   2. Right ventricular systolic function is moderately reduced. The right  ventricular size is mildly enlarged.   3. Left atrial size was moderately dilated. No left atrial/left atrial  appendage thrombus was detected.   4. Right atrial size was mildly dilated.   5. There is severe posteriorly directed eccentric mitral valve  regurgitation secondary to restriction of the posterior mitral valve  leaflet and poor leaflet coaptation. There is blunting of right upper  pulmonary vein flow. BP during study 100s/60s.   6. Tricuspid  valve regurgitation is mild to moderate.   7. The aortic valve is normal in structure. Aortic valve regurgitation is  not visualized.  Cardiac monitor on 12/17/2021:   30 day monitor. Data available from 75% of planned monitored time   Min HR 61, Max HR 159, Avg HR 81   Symptoms correlated with sinus rhythm, rare PVCs.   Short runs of SVT at times with aberrancy without symptoms reported. 8 and 4  beat run of NSVT no reported symptoms  2D echocardiogram on September 24, 2021: 1. Left ventricular ejection fraction, by estimation, is 30 to 35%. The  left ventricle has moderately decreased function. Left ventricular  endocardial border not optimally defined to evaluate regional wall motion,  global hypokinesis seen. The left  ventricular internal cavity size was moderately dilated. There is mild  left ventricular hypertrophy. Left ventricular diastolic parameters are  indeterminate.   2. Right ventricular systolic function is mildly reduced. The right  ventricular size is mildly enlarged. There is mildly elevated pulmonary  artery systolic pressure. The estimated right ventricular systolic  pressure is 36.3 mmHg.   3. Left atrial size was moderately dilated.   4. Right atrial size was mildly dilated.   5. MR mechanism appears to be posterior leaflet restriction with relative  anterior leaflet override with posteriorly directed jet of MR. Review of  serial studies suggests regional wall motion abnormalities contribute to  posterior leaflet restriction. No   pulmonary vein reversal seen on this study, systolic PV blunting.. The  mitral valve is grossly normal. Moderate to severe mitral valve  regurgitation. No evidence of mitral stenosis.   6. Tricuspid valve regurgitation is mild to moderate.   7. The aortic valve is tricuspid. Aortic valve regurgitation is not  visualized. No aortic stenosis is present.   8. The inferior vena cava is dilated in size with <50% respiratory  variability,  suggesting right atrial pressure of 15 mmHg.  Right and left heart cath on Jun 19, 2021: 40 to 50% proximal right coronary narrowing. Right dominant anatomy Coronary arteries otherwise normal Significant  elevation LVEDP 30 mmHg consistent with acute on chronic combined systolic and diastolic heart failure Mild pulmonary hypertension, mean pressure 30 mmHg.  WHO group II etiology based on hemodysnamics.. Capillary wedge mean 24 mmHg.  V wave to 40 mmHg suggesting some degree of mitral regurgitation. Cardiac output 8.5 L/min with index 3.57 L/min/m. Pulmonary resistance 0.7 Wood units.   RECOMMENDATIONS: Care with IV fluid administration. Needs diuresis. Start heart failure therapy including ARNI and SGLT2 as tolerated.  Peripheral vascular thrombectomy on 02/17/2021: Completion venogram of the left leg demonstrated resolution of the thrombotic burden with no flow-limiting stenosis appreciated through the common femoral vein and femoral vein.  There was narrowing appreciated in the external iliac vein, however as noted on previous CT, this appears to be due to to fibroids.  I elected not to stent the patient, and will have her see obstetrics and gynecology.  I do not think this was the nidus for her thrombus, but rather having a subtherapeutic INR.   Zyairah tolerated the case well.  The percutaneous access site was closed with 4-0 Monocryl suture.  Custom compression stockings have been ordered, and the leg was wrapped postoperatively.   Venous ultrasound of lower left extremity on January 30, 2021: Acute, nearly occlusive thrombus extending from the left popliteal vein through the common femoral vein. Extent of thrombus central to the inguinal ligament is unable to be determined on this study. Poor visualization of the calf veins.   Recommend CTV abdomen pelvis to evaluate for central extent of left lower extremity deep vein thrombosis. If thrombus extends into the pelvis, recommend  consultation to Interventional Radiology or other qualified endovascular specialist.   Recent Labs: 11/14/2021: TSH 3.122 02/13/2022: ALT 15 02/16/2022: Magnesium 2.1 03/11/2022: B Natriuretic Peptide 1,854.6 03/12/2022: BUN 20; Creatinine, Ser 1.03; Hemoglobin 10.3; Platelets 256; Potassium 3.8; Sodium 138  Recent Lipid Panel    Component Value Date/Time   CHOL 77 11/14/2021 0816   TRIG 35 11/14/2021 0816   HDL 27 (L) 11/14/2021 0816   CHOLHDL 2.9 11/14/2021 0816   VLDL 7 11/14/2021 0816   LDLCALC 43 11/14/2021 0816   LDLCALC 92 01/31/2017 1119    Physical Exam:    VS:  There were no vitals taken for this visit.    Wt Readings from Last 3 Encounters:  03/29/22 242 lb (109.8 kg)  03/12/22 264 lb 12.4 oz (120.1 kg)  03/11/22 263 lb 3.2 oz (119.4 kg)     GEN: Obese, 47 y.o. female in no acute distress HEENT: Normal NECK: No JVD; No carotid bruits CARDIAC: S1/S2, RRR, no murmurs, rubs, gallops; 2+ pulses; Focal swelling noted to right chest wall under collar bone.  RESPIRATORY:  Clear to auscultation without rales, wheezing or rhonchi  ABDOMEN: Soft, non-tender, non-distended MUSCULOSKELETAL:  Improved swelling of right breast; Mild, nonpitting edema (L>R), no deformity  SKIN: Warm and dry NEUROLOGIC:  Alert and oriented x 3 PSYCHIATRIC:  Normal affect   ASSESSMENT:    No diagnosis found.   PLAN:    In order of problems listed above:  HFrEF, NICM TTE 02/2022 EF 30-35%. Euvolemic and well compensated on exam. Etiology unclear for heart failure.  Cardiac MRI has been arranged and ordered previously.  Continue current medication regimen, with her current BP unable to up-titrate GDMT. Low sodium diet, fluid restriction <2L, and daily weights encouraged. Educated to contact our office for weight gain of 2 lbs overnight or 5 lbs in one week. Continue pulmonary rehab.  Follow-up with HF clinic.  Will obtain BMET as recent medications were adjusted at HF clinic recently.   Hx of  syncope, hx of NSVT Etiology felt to be vasovagal. Denies any recurrence or symptoms. Past monitor revealed NSVT/PVC's. Continue current medication regimen.  Cardiac MRI and right heart cath have been previously arranged as mentioned above.  Heart healthy diet recommended. Continue to follow-up with EP.   CAD Stable with no anginal symptoms. No indication for ischemic evaluation.  Previous cardiac catheterization 05/2021 revealed nonobstructive CAD.  Not on aspirin due to being on Coumadin.  Continue atorvastatin, losartan, and Toprol-XL.  Heart healthy diet recommended.  HTN Blood pressure soft. Continue current medication regimen.   Discussed to monitor BP at home at least 2 hours after medications and sitting for 5-10 minutes. Heart healthy diet recommended.   Hx of PE/DVT, left leg swelling Previous history of unprovoked saddle PE in 2015, hx of recurrent unprovoked DVT with hx of thrombectomy in 01/2021. Has IVC filter placed, could not be removed previously d/t significant thrombus burden on filter. Denies any bleeding issues while on Coumadin.  Continue to follow-up with Coumadin clinic. Lower extremity swelling (L>R) improved and chronic from past injury, will cancel previously ordered dopplers. Has hx of recurrent left lower extremity DVT.   6. Obesity BMI improved, now 33.75. Congratulated her on her significant weight loss. Weight loss via diet and exercise as tolerated encouraged. Discussed the impact being overweight would have on cardiovascular risk.  7. Focal swelling along right chest wall Noted below collar bone, said has been there for several months, but more prominent now that she has lost weight. Will arrange non-contrast CT scan of chest. Continue to follow with PCP.  8.  Disposition: Follow up with me or [facility]  in 6-8 weeks. Follow up with Dr. Dina Rich in 6 months.  Medication Adjustments/Labs and Tests Ordered: Current medicines are reviewed at length with the patient  today.  Concerns regarding medicines are outlined above.  No orders of the defined types were placed in this encounter.  No orders of the defined types were placed in this encounter.   There are no Patient Instructions on file for this visit.   Signed, Sharlene Dory, NP  05/06/2022 8:16 AM    [facility]

## 2022-05-07 ENCOUNTER — Encounter (HOSPITAL_COMMUNITY): Payer: Medicare HMO

## 2022-05-09 ENCOUNTER — Encounter (HOSPITAL_COMMUNITY): Payer: Medicare HMO

## 2022-05-14 ENCOUNTER — Encounter (HOSPITAL_COMMUNITY): Payer: Medicare HMO

## 2022-05-16 ENCOUNTER — Encounter (HOSPITAL_COMMUNITY): Payer: Medicare HMO

## 2022-05-20 ENCOUNTER — Telehealth: Payer: Self-pay

## 2022-05-20 NOTE — Telephone Encounter (Signed)
The patient no-showed to her MR consult with Dr. Excell Seltzer earlier this month and did not call to reschedule.  Called to see if she is still interested in consult and she confirmed she is and would like to be scheduled. Rescheduled the patient to see Dr. Excell Seltzer 06/21/2022. She was grateful for call and agreed with plan.

## 2022-05-21 ENCOUNTER — Ambulatory Visit: Payer: Medicare HMO | Attending: Nurse Practitioner | Admitting: Nurse Practitioner

## 2022-05-21 ENCOUNTER — Encounter: Payer: Self-pay | Admitting: Nurse Practitioner

## 2022-05-21 ENCOUNTER — Encounter (HOSPITAL_COMMUNITY): Payer: Medicare HMO

## 2022-05-21 VITALS — BP 104/70 | HR 83 | Ht 70.0 in | Wt 253.6 lb

## 2022-05-21 DIAGNOSIS — I272 Pulmonary hypertension, unspecified: Secondary | ICD-10-CM

## 2022-05-21 DIAGNOSIS — E669 Obesity, unspecified: Secondary | ICD-10-CM

## 2022-05-21 DIAGNOSIS — Z87898 Personal history of other specified conditions: Secondary | ICD-10-CM

## 2022-05-21 DIAGNOSIS — I5020 Unspecified systolic (congestive) heart failure: Secondary | ICD-10-CM

## 2022-05-21 DIAGNOSIS — I4729 Other ventricular tachycardia: Secondary | ICD-10-CM | POA: Diagnosis not present

## 2022-05-21 DIAGNOSIS — I340 Nonrheumatic mitral (valve) insufficiency: Secondary | ICD-10-CM | POA: Diagnosis not present

## 2022-05-21 DIAGNOSIS — I2510 Atherosclerotic heart disease of native coronary artery without angina pectoris: Secondary | ICD-10-CM

## 2022-05-21 DIAGNOSIS — I10 Essential (primary) hypertension: Secondary | ICD-10-CM | POA: Diagnosis not present

## 2022-05-21 DIAGNOSIS — Z79899 Other long term (current) drug therapy: Secondary | ICD-10-CM

## 2022-05-21 DIAGNOSIS — Z86718 Personal history of other venous thrombosis and embolism: Secondary | ICD-10-CM | POA: Diagnosis not present

## 2022-05-21 DIAGNOSIS — Z86711 Personal history of pulmonary embolism: Secondary | ICD-10-CM | POA: Diagnosis not present

## 2022-05-21 DIAGNOSIS — M79605 Pain in left leg: Secondary | ICD-10-CM

## 2022-05-21 DIAGNOSIS — R222 Localized swelling, mass and lump, trunk: Secondary | ICD-10-CM

## 2022-05-21 DIAGNOSIS — M7989 Other specified soft tissue disorders: Secondary | ICD-10-CM

## 2022-05-21 NOTE — Progress Notes (Unsigned)
Cardiology Office Note:    Date:  05/21/2022  ID:  [name] [name], DOB 10/24/1975, MRN [id number]  PCP:  Benetta Spar, MD   [facility] Providers Cardiologist:  Dina Rich, MD Electrophysiologist:  Maurice Small, MD      Referring MD: Benetta Spar*   CC: Here for follow-up  History of Present Illness:    [name] [name] is a 47 y.o. 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 Wyline Mood in 2019 for PE in 2015 to get set up with Coumadin clinic.  In January 2023, had recurrent left lower extremity DVT.  CTA was checked for PE, negative.  Admitted in May 2023 for CAP CHF, worsening anemia requiring 3 units of PRBC, TTE revealed mildly reduced EF at 45 to 50% requiring diuresis.    Readmitted 05/2021 with respiratory failure requiring intubation, was felt to be likely combination of CHF and pneumonia. RHC in 05/2021 revealed 40 to 50% pRCA narrowing, otherwise normal coronary arteries.  LVEDP elevated, consistent with acute on chronic combined CHF. BP's were soft during admission, therefore limiting medication therapy.  TTE 08/2021 revealed EF 30 to 35%.    Admitted 09/2021 with syncope, nausea, dizziness with subsequent C2 fracture. Was leaning over truck, felt nauseous, face planted down and went completely out.  No surgery recommended. TTE revealed EF 30 to 35%, mild LVH, mildly elevated PASP, mildly reduced RVSF, mild RVE, moderate LAE, mild RAE, moderate to severe MR, mild to moderate TR. Syncope felt to be vasovagal.  Preliminary monitor report revealed NSR, occasional NSVT (max lasting 9 beats), 4% PVCs, and less than 1% PACs.   Presented to [facility] ED on 12/28/21 for  right breast pain and swelling, endorsed drainage from nipple. Coumadin was held, was bridged with heparin, and underwent punch biopsy by general surgery on 12/29/21. Pathology showed benign breast parenchyma with acute and chronic inflammation. Ultrasound showed retro areolar abscess and underwent aspiration on 01/01/2022. Hospital course complicated by IDA, required 2 units of PRBC for low hemoglobin. BNP was elevated at 1232, found to be having acute CHF exacerbation. Noted lower extremity swelling during hospitalization. Titration of GDMT was limited d/t baseline hypotension during hospitalization, some antihypertensives were held. Experienced left thigh pain during hospitalization.  CT was negative for fluid collection or hematoma.     Presented back to [facility] 01/2022 for acute on chronic HFrEF. Repeat Echo showed EF 35-40%, low normal RVF, severe MR, mild to mod TR. She endorsed some indiscretion with fluid intake. Viral panel came back positive for Coronavirus OC43. CXR without consolidation.   Underwent RHC on 03/08/2022 that revealed severely elevated pre and postcapillary filling pressures, mild to moderately elevated PA mean and PVR secondary to combined pre and postcapillary pulmonary hypertension, moderately reduced CI recommended to increase outpatient diuretics and start evaluation for mitral valve repair/replacement.  Repeat TTE 02/2022 revealed akinesis of inferolateral wall with overall moderate to severe LV dysfunction, EF 30 to 35%, restricted posterior MV leaflet with severe eccentric MR, no evidence of MS, severe MR. TEE results below.   03/29/2022 - Saw her for follow-up.  Was doing well.  Denied any acute cardiac complaints or issues. Lost 74 lbs since I first saw her.  Denied any chest pain or associated symptoms. Noted having an appointment on April 29, 2022 with the structural heart team for evaluation for possible upcoming mitral valve repair.  05/21/2022: Presents for follow-up. Patient  appears sad and depressed. Sadly, she tells me that her best friend died suddenly 2 days ago. Denies any chest pain or shortness of breath.  Weight is up 10 pounds from last time I saw her.  She is noticing more edema in her lower extremities.  Main concern is left lower extremity pain.  Denies any chest pain, shortness of breath, palpitations, syncope, presyncope, dizziness, orthopnea, PND, acute bleeding, or claudication.  Has upcoming visit with Dr. Excell Seltzer next month. She confirms with me that she is taking Lasix 40 mg BID instead of 80 mg BID.   Past Medical History:  Diagnosis Date   Anemia    Blood transfusion without reported diagnosis    CAD (coronary artery disease)    Nonobstructive   Closed left ankle fracture    August 30 2012   Clotting disorder [facility])    DVT (deep venous thrombosis) (HCC)    L leg   Fibroid tumor    HFrEF (heart failure with reduced ejection fraction) (HCC)    Hidradenitis    Hypertension    Mitral regurgitation    NICM (nonischemic cardiomyopathy) (HCC)    NSVT (nonsustained ventricular tachycardia) (HCC)    Obesity    OSA on CPAP    Pulmonary embolism (HCC) 04/2013   Sleep apnea     Past Surgical History:  Procedure Laterality Date   ABSCESS DRAINAGE Bilateral 01/10/2015   BREAST BIOPSY Right 02/01/2020   benign   COLONOSCOPY WITH PROPOFOL N/A 06/08/2018   Procedure: COLONOSCOPY WITH PROPOFOL;  Surgeon: Corbin Ade, MD;  Location: [facility] ENDO SUITE;  Service: Endoscopy;  Laterality: N/A;  2:30pm   CYST REMOVAL TRUNK     IVC FILTER PLACEMENT ([facility] HX)     LOWER EXTREMITY VENOGRAPHY N/A 02/07/2021   Procedure: LOWER EXTREMITY VENOGRAPHY;  Surgeon: Victorino Sparrow, MD;  Location: [facility] INVASIVE CV LAB;  Service: Cardiovascular;  Laterality: N/A;   PERIPHERAL VASCULAR THROMBECTOMY N/A 02/07/2021   Procedure: PERIPHERAL VASCULAR THROMBECTOMY;  Surgeon: Victorino Sparrow, MD;  Location: [facility] INVASIVE CV LAB;  Service: Cardiovascular;  Laterality: N/A;    RIGHT HEART CATH N/A 03/08/2022   Procedure: RIGHT HEART CATH;  Surgeon: Dorthula Nettles, DO;  Location: [facility] INVASIVE CV LAB;  Service: Cardiovascular;  Laterality: N/A;   RIGHT/LEFT HEART CATH AND CORONARY ANGIOGRAPHY N/A 06/19/2021   Procedure: RIGHT/LEFT HEART CATH AND CORONARY ANGIOGRAPHY;  Surgeon: Lyn Records, MD;  Location: [facility] INVASIVE CV LAB;  Service: Cardiovascular;  Laterality: N/A;   TEE WITHOUT CARDIOVERSION N/A 03/08/2022   Procedure: TRANSESOPHAGEAL ECHOCARDIOGRAM (TEE);  Surgeon: Dorthula Nettles, DO;  Location: [facility] ENDOSCOPY;  Service: Cardiovascular;  Laterality: N/A;    Current Meds  Medication Sig   albuterol (VENTOLIN HFA) 108 (90 Base) MCG/ACT inhaler Inhale 2 puffs into the lungs every 2 (two) hours as needed for wheezing or shortness of breath.   atorvastatin (LIPITOR) 20 MG tablet Take 1 tablet (20 mg total) by mouth daily.   empagliflozin (JARDIANCE) 10 MG TABS tablet Take 1 tablet (10 mg total) by mouth daily before breakfast.   furosemide (LASIX) 40 MG tablet Take 40 mg by mouth 2 (two) times daily.   losartan (COZAAR) 25 MG tablet Take 0.5 tablets (12.5 mg total) by mouth daily.   Magnesium Oxide 400 MG CAPS  Take 1 capsule (400 mg total) by mouth daily.   metoprolol succinate (TOPROL-XL) 25 MG 24 hr tablet Take 1 tablet (25 mg total) by mouth daily. Take with or immediately following a meal.   midodrine (PROAMATINE) 2.5 MG tablet Take 1 tablet (2.5 mg total) by mouth 2 (two) times daily with a meal.   potassium chloride SA (KLOR-CON M) 20 MEQ tablet Take 20 mEq by mouth 2 (two) times daily.   spironolactone (ALDACTONE) 25 MG tablet Take 1 tablet (25 mg total) by mouth daily.   warfarin (COUMADIN) 10 MG tablet Take 1/2 tablet to 1 tablet daily or as directed by coumadin clinic.   warfarin (COUMADIN) 5 MG tablet TAKE (1) OR (1) 1/2 TABLET BY MOUTH DAILY AS DIRECTED. (Patient taking differently: Take 5-10 mg by mouth See admin instructions. Take 5 mg every evening   EXCEPT: Monday take 10 mg (Tablet))    Allergies:   Sulfa antibiotics   Social History   Socioeconomic History   Marital status: Widowed    Spouse name: Not on file   Number of children: Not on file   Years of education: Not on file   Highest education level: Not on file  Occupational History   Not on file  Tobacco Use   Smoking status: Every Day    Years: 18    Types: Cigarettes    Passive exposure: Never   Smokeless tobacco: Never   Tobacco comments:    Smokes 2-3 cigarettes daily as of 02/13/22  Vaping Use   Vaping Use: Never used  Substance and Sexual Activity   Alcohol use: Not Currently    Alcohol/week: 2.0 standard drinks of alcohol    Types: 2 Cans of beer per week    Comment: twice a month   Drug use: No   Sexual activity: Not Currently    Birth control/protection: Pill, None  Other Topics Concern   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    Has a 46 year old Aviauna.   Live with parents.   Was working 5 days a weeks.   Not working right now.    Attends church.    Social Determinants of Health   Financial Resource Strain: Not on file  Food Insecurity: No Food Insecurity (02/13/2022)   Hunger Vital Sign    Worried About Running Out of Food in the Last Year: Never true    Ran Out of Food in the Last Year: Never true  Transportation Needs: No Transportation Needs (02/13/2022)   PRAPARE - Administrator, Civil Service (Medical): No    Lack of Transportation (Non-Medical): No  Physical Activity: Not on file  Stress: Not on file  Social Connections: Not on file     Family History: The patient's family history includes Cancer in her maternal grandmother; Diabetes in her paternal aunt and paternal uncle; Heart attack in her paternal grandmother; Hypertension in her mother; Other in her father. There is no history of Colon cancer.  ROS:    Please see the history of present illness.     All other systems reviewed and are negative.  EKGs/Labs/Other Studies Reviewed:    The following studies were reviewed today:   EKG:  EKG is not ordered today.    TTE on 03/14/2022:  1. Akinesis of the inferolateral wall with overall moderate to severe LV  dysfunction; restricted posterior MV leaflet with severe,  eccentric MR.   2. Left ventricular ejection fraction, by estimation, is 30 to 35%. The  left ventricle has moderately decreased function. The left ventricle  demonstrates regional wall motion abnormalities (see scoring  diagram/findings for description). The left  ventricular internal cavity size was severely dilated. Left ventricular  diastolic function could not be evaluated.   3. Right ventricular systolic function is moderately reduced. The right  ventricular size is normal. There is severely elevated pulmonary artery  systolic pressure.   4. Left atrial size was moderately dilated.   5. The mitral valve is normal in structure. Severe mitral valve  regurgitation. No evidence of mitral stenosis.   6. The aortic valve is tricuspid. Aortic valve regurgitation is not  visualized. Aortic valve sclerosis is present, with no evidence of aortic  valve stenosis.   7. The inferior vena cava is dilated in size with <50% respiratory  variability, suggesting right atrial pressure of 15 mmHg.  RHC on 03/08/2022:  IMPRESSION: Severely elevated pre and post capillary filling pressures.  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    RECOMMENDATIONS: - Increase outpatient diuretics.  - Start evaluation for mitral valve repair/replacement.   TEE on 03/08/2022:   1. Left ventricular ejection fraction, by estimation, is 35%. The left  ventricle has moderately decreased function. The left ventricular internal  cavity size was moderately to severely dilated measuring 7.1cm.   2.  Right ventricular systolic function is moderately reduced. The right  ventricular size is mildly enlarged.   3. Left atrial size was moderately dilated. No left atrial/left atrial  appendage thrombus was detected.   4. Right atrial size was mildly dilated.   5. There is severe posteriorly directed eccentric mitral valve  regurgitation secondary to restriction of the posterior mitral valve  leaflet and poor leaflet coaptation. There is blunting of right upper  pulmonary vein flow. BP during study 100s/60s.   6. Tricuspid valve regurgitation is mild to moderate.   7. The aortic valve is normal in structure. Aortic valve regurgitation is  not visualized.  Cardiac monitor on 12/17/2021:   30 day monitor. Data available from 75% of planned monitored time   Min HR 61, Max HR 159, Avg HR 81   Symptoms correlated with sinus rhythm, rare PVCs.   Short runs of SVT at times with aberrancy without symptoms reported. 8 and 4  beat run of NSVT no reported symptoms  2D echocardiogram on September 24, 2021: 1. Left ventricular ejection fraction, by estimation, is 30 to 35%. The  left ventricle has moderately decreased function. Left ventricular  endocardial border not optimally defined to evaluate regional wall motion,  global hypokinesis seen. The left  ventricular internal cavity size was moderately dilated. There is mild  left ventricular hypertrophy. Left ventricular diastolic parameters are  indeterminate.   2. Right ventricular systolic function is mildly reduced. The right  ventricular size is mildly enlarged. There is mildly elevated pulmonary  artery systolic pressure. The estimated right ventricular systolic  pressure is 36.3 mmHg.   3. Left atrial size was moderately dilated.   4. Right atrial size was mildly dilated.   5. MR mechanism appears to be posterior leaflet restriction with relative  anterior leaflet override with posteriorly directed jet of MR. Review of  serial studies  suggests regional wall motion abnormalities contribute to  posterior leaflet restriction. No   pulmonary vein reversal seen on this  study, systolic PV blunting.. The  mitral valve is grossly normal. Moderate to severe mitral valve  regurgitation. No evidence of mitral stenosis.   6. Tricuspid valve regurgitation is mild to moderate.   7. The aortic valve is tricuspid. Aortic valve regurgitation is not  visualized. No aortic stenosis is present.   8. The inferior vena cava is dilated in size with <50% respiratory  variability, suggesting right atrial pressure of 15 mmHg.  Right and left heart cath on Jun 19, 2021: 40 to 50% proximal right coronary narrowing. Right dominant anatomy Coronary arteries otherwise normal Significant elevation LVEDP 30 mmHg consistent with acute on chronic combined systolic and diastolic heart failure Mild pulmonary hypertension, mean pressure 30 mmHg.  WHO group II etiology based on hemodysnamics.. Capillary wedge mean 24 mmHg.  V wave to 40 mmHg suggesting some degree of mitral regurgitation. Cardiac output 8.5 L/min with index 3.57 L/min/m. Pulmonary resistance 0.7 Wood units.   RECOMMENDATIONS: Care with IV fluid administration. Needs diuresis. Start heart failure therapy including ARNI and SGLT2 as tolerated.  Peripheral vascular thrombectomy on 02/17/2021: Completion venogram of the left leg demonstrated resolution of the thrombotic burden with no flow-limiting stenosis appreciated through the common femoral vein and femoral vein.  There was narrowing appreciated in the external iliac vein, however as noted on previous CT, this appears to be due to to fibroids.  I elected not to stent the patient, and will have her see obstetrics and gynecology.  I do not think this was the nidus for her thrombus, but rather having a subtherapeutic INR.   Helene tolerated the case well.  The percutaneous access site was closed with 4-0 Monocryl suture.  Custom compression  stockings have been ordered, and the leg was wrapped postoperatively.   Venous ultrasound of lower left extremity on January 30, 2021: Acute, nearly occlusive thrombus extending from the left popliteal vein through the common femoral vein. Extent of thrombus central to the inguinal ligament is unable to be determined on this study. Poor visualization of the calf veins.   Recommend CTV abdomen pelvis to evaluate for central extent of left lower extremity deep vein thrombosis. If thrombus extends into the pelvis, recommend consultation to Interventional Radiology or other qualified endovascular specialist.   Recent Labs: 11/14/2021: TSH 3.122 02/13/2022: ALT 15 02/16/2022: Magnesium 2.1 03/11/2022: B Natriuretic Peptide 1,854.6 03/12/2022: BUN 20; Creatinine, Ser 1.03; Hemoglobin 10.3; Platelets 256; Potassium 3.8; Sodium 138  Recent Lipid Panel    Component Value Date/Time   CHOL 77 11/14/2021 0816   TRIG 35 11/14/2021 0816   HDL 27 (L) 11/14/2021 0816   CHOLHDL 2.9 11/14/2021 0816   VLDL 7 11/14/2021 0816   LDLCALC 43 11/14/2021 0816   LDLCALC 92 01/31/2017 1119    Physical Exam:    VS:  BP 104/70 (BP Location: Right Arm, Patient Position: Sitting, Cuff Size: Large)   Pulse 83   Ht 5\' 10"  (1.778 m)   Wt 253 lb 9.6 oz (115 kg)   SpO2 100%   BMI 36.39 kg/m     Wt Readings from Last 3 Encounters:  05/21/22 253 lb 9.6 oz (115 kg)  03/29/22 242 lb (109.8 kg)  03/12/22 264 lb 12.4 oz (120.1 kg)     GEN: Obese, 47 y.o. female in no acute distress HEENT: Normal NECK: No JVD; No carotid bruits CARDIAC: S1/S2, RRR, no murmurs, rubs, gallops; 2+ pulses; Focal swelling noted to right chest wall under collar bone, stable from last OV.  RESPIRATORY:  Clear to auscultation without rales, wheezing or rhonchi  MUSCULOSKELETAL:  Stable swelling of right breast; Mild, nonpitting edema, more noticed since last visit (L>R), no deformity  SKIN: Warm and dry NEUROLOGIC:  Alert and  oriented x 3 PSYCHIATRIC:  Normal affect   ASSESSMENT:    1. HFrEF (heart failure with reduced ejection fraction) (HCC)   2. Medication management   3. History of syncope   4. NSVT (nonsustained ventricular tachycardia) (HCC)   5. Coronary artery disease involving native heart without angina pectoris, unspecified vessel or lesion type   6. Essential hypertension, benign   7. History of pulmonary embolism   8. History of DVT (deep vein thrombosis)   9. Left leg swelling   10. Pulmonary hypertension, unspecified (HCC)   11. Severe mitral regurgitation   12. Obesity (BMI 30-39.9)   13. Localized swelling of chest wall   14. Left leg pain      PLAN:    In order of problems listed above:  HFrEF, medication management TTE 02/2022 EF 30-35%. Weight is up and dose have BLE swelling on exam, does have appearance of slightly being volume overloaded. Etiology unclear for heart failure.  Cardiac MRI has been arranged and ordered previously.  Instructed to take Lasix 80 mg in AM, 40 mg in PM for the next 3 days, then return to normal.  Continue rest of GDMT. Low sodium diet, fluid restriction <2L, and daily weights encouraged. Educated to contact our office for weight gain of 2 lbs overnight or 5 lbs in one week. Continue pulmonary rehab.  Follow-up with HF clinic. Will obtain BMET, Mag, and BNP in 2 weeks.   Hx of syncope, hx of NSVT Etiology felt to be vasovagal. Denies any recurrence or symptoms. Past monitor revealed NSVT/PVC's. Continue current medication regimen.  Cardiac MRI has been previously arranged as mentioned above.  Heart healthy diet recommended. Continue to follow-up with EP.   CAD Stable with no anginal symptoms. No indication for ischemic evaluation.  Previous cardiac catheterization 05/2021 revealed nonobstructive CAD.  Not on aspirin due to being on Coumadin.  Continue atorvastatin, losartan, and Toprol-XL.  Heart healthy diet recommended.  HTN Blood pressure soft. Continue  current medication regimen.   Discussed to monitor BP at home at least 2 hours after medications and sitting for 5-10 minutes. Heart healthy diet recommended.   Hx of PE/DVT, left leg swelling Previous history of unprovoked saddle PE in 2015, hx of recurrent unprovoked DVT with hx of thrombectomy in 01/2021. Has IVC filter placed, could not be removed previously d/t significant thrombus burden on filter. Denies any bleeding issues while on Coumadin.  Continue to follow-up with Coumadin clinic.  Has hx of recurrent left lower extremity DVT.   6. Pulmonary hypertension Etiology most likely d/t CHF. RHC revealed moderately elevated PA mean and PVR secondary to combined pre and postcapillary pulmonary hypertension, was recommended to increase outpatient diuretics and to start evaluation for mitral valve replacement/repair. Denies any recent [facility]. Adjusting diuretics as mentioned above.   7. Severe mitral valve regurgitation Noted on Echo. No evidence of mitral stenosis. Has upcoming appt with Dr. Excell Seltzer for evaluation. Increasing diuretics as mentioned above, no other medication changes. Heart healthy diet encouraged. Follow-up with Dr. Excell Seltzer as scheduled.  8. Obesity BMI 36.39. Weight loss via diet and exercise as tolerated encouraged. Discussed the impact being overweight would have on cardiovascular risk.  9. Focal swelling along right chest wall, left leg pain Noted below collar bone, said has  been there for several months, stable since last OV. Previously arranged non-contrast CT scan of chest, pt currently declines arranging at this time. Continue to follow with PCP regarding left leg pain, may need orthopedic referral/evaluation.  10.  Disposition: Follow up with me or [facility]  in 2-3 months or sooner if anything changes.   Medication Adjustments/Labs and Tests Ordered: Current medicines are reviewed at length with the patient today.  Concerns regarding medicines are outlined above.  Orders  Placed This Encounter  Procedures   Basic metabolic panel   Brain natriuretic peptide   Magnesium   No orders of the defined types were placed in this encounter.   Patient Instructions  Medication Instructions:  Your physician has recommended you make the following change in your medication:  Take lasix 80 mg in the morning and 40 mg in the evening for 3 days, resume previous instructions Continue all other medications as directed  Labwork: BMET, MAG, BNP due in 2 weeks (by 5/12) @ [facility]  Testing/Procedures: none  Follow-Up:  Your physician recommends that you schedule a follow-up appointment in: 2-3 months  Any Other Special Instructions Will Be Listed Below (If Applicable).  If you need a refill on your cardiac medications before your next appointment, please call your pharmacy.       SignedSharlene Dory, NP  05/23/2022 12:13 AM    [facility]

## 2022-05-21 NOTE — Patient Instructions (Signed)
Medication Instructions:  Your physician has recommended you make the following change in your medication:  Take lasix 80 mg in the morning and 40 mg in the evening for 3 days, resume previous instructions Continue all other medications as directed  Labwork: BMET, MAG, BNP due in 2 weeks (by 5/12) @ [facility]  Testing/Procedures: none  Follow-Up:  Your physician recommends that you schedule a follow-up appointment in: 2-3 months  Any Other Special Instructions Will Be Listed Below (If Applicable).  If you need a refill on your cardiac medications before your next appointment, please call your pharmacy.

## 2022-05-22 NOTE — Progress Notes (Incomplete)
Cardiology Office Note:    Date:  05/21/2022  ID:  [name] [name], DOB [birth year], MRN [id number]  PCP:  Benetta Spar, MD   [facility] Providers Cardiologist:  Dina Rich, MD Electrophysiologist:  Maurice Small, MD      Referring MD: Benetta Spar*   CC: Here for follow-up  History of Present Illness:    [name] [name] is a 47 y.o. 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 Wyline Mood in 2019 for PE in 2015 to get set up with Coumadin clinic.  In January 2023, had recurrent left lower extremity DVT.  CTA was checked for PE, negative.  Admitted in May 2023 for CAP CHF, worsening anemia requiring 3 units of PRBC, TTE revealed mildly reduced EF at 45 to 50% requiring diuresis.    Readmitted 05/2021 with respiratory failure requiring intubation, was felt to be likely combination of CHF and pneumonia. RHC in 05/2021 revealed 40 to 50% pRCA narrowing, otherwise normal coronary arteries.  LVEDP elevated, consistent with acute on chronic combined CHF. BP's were soft during admission, therefore limiting medication therapy.  TTE 08/2021 revealed EF 30 to 35%.    Admitted 09/2021 with syncope, nausea, dizziness with subsequent C2 fracture. Was leaning over truck, felt nauseous, face planted down and went completely out.  No surgery recommended. TTE revealed EF 30 to 35%, mild LVH, mildly elevated PASP, mildly reduced RVSF, mild RVE, moderate LAE, mild RAE, moderate to severe MR, mild to moderate TR. Syncope felt to be vasovagal.  Preliminary monitor report revealed NSR, occasional NSVT (max lasting 9 beats), 4% PVCs, and less than 1% PACs.   Presented to [facility] ED on 12/28/21 for  right breast pain and swelling, endorsed drainage from nipple. Coumadin was held, was bridged with heparin, and underwent punch biopsy by general surgery on 12/29/21. Pathology showed benign breast parenchyma with acute and chronic inflammation. Ultrasound showed retro areolar abscess and underwent aspiration on 01/01/2022. Hospital course complicated by IDA, required 2 units of PRBC for low hemoglobin. BNP was elevated at 1232, found to be having acute CHF exacerbation. Noted lower extremity swelling during hospitalization. Titration of GDMT was limited d/t baseline hypotension during hospitalization, some antihypertensives were held. Experienced left thigh pain during hospitalization.  CT was negative for fluid collection or hematoma.     Presented back to [facility] 01/2022 for acute on chronic HFrEF. Repeat Echo showed EF 35-40%, low normal RVF, severe MR, mild to mod TR. She endorsed some indiscretion with fluid intake. Viral panel came back positive for Coronavirus OC43. CXR without consolidation.   Underwent RHC on 03/08/2022 that revealed severely elevated pre and postcapillary filling pressures, mild to moderately elevated PA mean and PVR secondary to combined pre and postcapillary pulmonary hypertension, moderately reduced CI recommended to increase outpatient diuretics and start evaluation for mitral valve repair/replacement.  Repeat TTE 02/2022 revealed akinesis of inferolateral wall with overall moderate to severe LV dysfunction, EF 30 to 35%, restricted posterior MV leaflet with severe eccentric MR, no evidence of MS, severe MR. TEE results below.   03/29/2022 - Saw her for follow-up.  Was doing well.  Denied any acute cardiac complaints or issues. Lost 74 lbs since I first saw her.  Denied any chest pain or associated symptoms. Noted having an appointment on April 29, 2022 with the structural heart team for evaluation for possible upcoming mitral valve repair.  05/21/2022: Presents for follow-up. Patient  appears sad and depressed. Sadly, she tells me that her best friend died suddenly 2 days ago. Denies any chest pain or shortness of breath.  Weight is up 10 pounds from last time I saw her.  She is noticing more edema in her lower extremities.  Main concern is left lower extremity pain.  Denies any chest pain, shortness of breath, palpitations, syncope, presyncope, dizziness, orthopnea, PND, acute bleeding, or claudication.  Has upcoming visit with Dr. Excell Seltzer next month. She confirms with me that she is taking Lasix 40 mg BID instead of 80 mg BID.   Past Medical History:  Diagnosis Date  . Anemia   . Blood transfusion without reported diagnosis   . CAD (coronary artery disease)    Nonobstructive  . Closed left ankle fracture    August 30 2012  . Clotting disorder (HCC)   . DVT (deep venous thrombosis) (HCC)    L leg  . Fibroid tumor   . HFrEF (heart failure with reduced ejection fraction) (HCC)   . Hidradenitis   . Hypertension   . Mitral regurgitation   . NICM (nonischemic cardiomyopathy) (HCC)   . NSVT (nonsustained ventricular tachycardia) (HCC)   . Obesity   . OSA on CPAP   . Pulmonary embolism (HCC) 04/2013  . Sleep apnea     Past Surgical History:  Procedure Laterality Date  . ABSCESS DRAINAGE Bilateral 01/10/2015  . BREAST BIOPSY Right 02/01/2020   benign  . COLONOSCOPY WITH PROPOFOL N/A 06/08/2018   Procedure: COLONOSCOPY WITH PROPOFOL;  Surgeon: Corbin Ade, MD;  Location: [facility] ENDO SUITE;  Service: Endoscopy;  Laterality: N/A;  2:30pm  . CYST REMOVAL TRUNK    . IVC FILTER PLACEMENT ([facility] HX)    . LOWER EXTREMITY VENOGRAPHY N/A 02/07/2021   Procedure: LOWER EXTREMITY VENOGRAPHY;  Surgeon: Victorino Sparrow, MD;  Location: [facility] INVASIVE CV LAB;  Service: Cardiovascular;  Laterality: N/A;  . PERIPHERAL VASCULAR THROMBECTOMY N/A 02/07/2021   Procedure: PERIPHERAL VASCULAR THROMBECTOMY;  Surgeon: Victorino Sparrow, MD;  Location: [facility] INVASIVE CV LAB;  Service: Cardiovascular;   Laterality: N/A;  . RIGHT HEART CATH N/A 03/08/2022   Procedure: RIGHT HEART CATH;  Surgeon: Dorthula Nettles, DO;  Location: [facility] INVASIVE CV LAB;  Service: Cardiovascular;  Laterality: N/A;  . RIGHT/LEFT HEART CATH AND CORONARY ANGIOGRAPHY N/A 06/19/2021   Procedure: RIGHT/LEFT HEART CATH AND CORONARY ANGIOGRAPHY;  Surgeon: Lyn Records, MD;  Location: [facility] INVASIVE CV LAB;  Service: Cardiovascular;  Laterality: N/A;  . TEE WITHOUT CARDIOVERSION N/A 03/08/2022   Procedure: TRANSESOPHAGEAL ECHOCARDIOGRAM (TEE);  Surgeon: Dorthula Nettles, DO;  Location: [facility] ENDOSCOPY;  Service: Cardiovascular;  Laterality: N/A;    Current Meds  Medication Sig  . albuterol (VENTOLIN HFA) 108 (90 Base) MCG/ACT inhaler Inhale 2 puffs into the lungs every 2 (two) hours as needed for wheezing or shortness of breath.  Marland Kitchen atorvastatin (LIPITOR) 20 MG tablet Take 1 tablet (20 mg total) by mouth daily.  . empagliflozin (JARDIANCE) 10 MG TABS tablet Take 1 tablet (10 mg total) by mouth daily before breakfast.  . furosemide (LASIX) 40 MG tablet Take 40 mg by mouth 2 (two) times daily.  Marland Kitchen losartan (COZAAR) 25 MG tablet Take 0.5 tablets (12.5 mg total) by mouth daily.  . Magnesium Oxide 400 MG CAPS  Take 1 capsule (400 mg total) by mouth daily.  . metoprolol succinate (TOPROL-XL) 25 MG 24 hr tablet Take 1 tablet (25 mg total) by mouth daily. Take with or immediately following a meal.  . midodrine (PROAMATINE) 2.5 MG tablet Take 1 tablet (2.5 mg total) by mouth 2 (two) times daily with a meal.  . potassium chloride SA (KLOR-CON M) 20 MEQ tablet Take 20 mEq by mouth 2 (two) times daily.  Marland Kitchen spironolactone (ALDACTONE) 25 MG tablet Take 1 tablet (25 mg total) by mouth daily.  Marland Kitchen warfarin (COUMADIN) 10 MG tablet Take 1/2 tablet to 1 tablet daily or as directed by coumadin clinic.  Marland Kitchen warfarin (COUMADIN) 5 MG tablet TAKE (1) OR (1) 1/2 TABLET BY MOUTH DAILY AS DIRECTED. (Patient taking differently: Take 5-10 mg by mouth See admin  instructions. Take 5 mg every evening  EXCEPT: Monday take 10 mg (Tablet))    Allergies:   Sulfa antibiotics   Social History   Socioeconomic History  . Marital status: Widowed    Spouse name: Not on file  . Number of children: Not on file  . Years of education: Not on file  . Highest education level: Not on file  Occupational History  . Not on file  Tobacco Use  . Smoking status: Every Day    Years: 18    Types: Cigarettes    Passive exposure: Never  . Smokeless tobacco: Never  . Tobacco comments:    Smokes 2-3 cigarettes daily as of 02/13/22  Vaping Use  . Vaping Use: Never used  Substance and Sexual Activity  . Alcohol use: Not Currently    Alcohol/week: 2.0 standard drinks of alcohol    Types: 2 Cans of beer per week    Comment: twice a month  . Drug use: No  . Sexual activity: Not Currently    Birth control/protection: Pill, None  Other Topics Concern  . 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    Has a 46 year old Aviauna.   Live with parents.   Was working 5 days a weeks.   Not working right now.    Attends church.    Social Determinants of Health   Financial Resource Strain: Not on file  Food Insecurity: No Food Insecurity (02/13/2022)   Hunger Vital Sign   . Worried About Programme researcher, broadcasting/film/video in the Last Year: Never true   . Ran Out of Food in the Last Year: Never true  Transportation Needs: No Transportation Needs (02/13/2022)   PRAPARE - Transportation   . Lack of Transportation (Medical): No   . Lack of Transportation (Non-Medical): No  Physical Activity: Not on file  Stress: Not on file  Social Connections: Not on file     Family History: The patient's family history includes Cancer in her maternal grandmother; Diabetes in her paternal aunt and paternal uncle; Heart attack in her paternal grandmother; Hypertension in her mother; Other in her father. There is no history of Colon  cancer.  ROS:    Please see the history of present illness.    All other systems reviewed and are negative.  EKGs/Labs/Other Studies Reviewed:    The following studies were reviewed today:   EKG:  EKG is not ordered today.    TTE on 03/14/2022:  1. Akinesis of the inferolateral wall with overall moderate to severe LV  dysfunction; restricted posterior MV leaflet with severe,  eccentric MR.   2. Left ventricular ejection fraction, by estimation, is 30 to 35%. The  left ventricle has moderately decreased function. The left ventricle  demonstrates regional wall motion abnormalities (see scoring  diagram/findings for description). The left  ventricular internal cavity size was severely dilated. Left ventricular  diastolic function could not be evaluated.   3. Right ventricular systolic function is moderately reduced. The right  ventricular size is normal. There is severely elevated pulmonary artery  systolic pressure.   4. Left atrial size was moderately dilated.   5. The mitral valve is normal in structure. Severe mitral valve  regurgitation. No evidence of mitral stenosis.   6. The aortic valve is tricuspid. Aortic valve regurgitation is not  visualized. Aortic valve sclerosis is present, with no evidence of aortic  valve stenosis.   7. The inferior vena cava is dilated in size with <50% respiratory  variability, suggesting right atrial pressure of 15 mmHg.  RHC on 03/08/2022:  IMPRESSION: Severely elevated pre and post capillary filling pressures.  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    RECOMMENDATIONS: - Increase outpatient diuretics.  - Start evaluation for mitral valve repair/replacement.   TEE on 03/08/2022:   1. Left ventricular ejection fraction, by estimation, is 35%. The left  ventricle has moderately decreased function. The left ventricular internal  cavity  size was moderately to severely dilated measuring 7.1cm.   2. Right ventricular systolic function is moderately reduced. The right  ventricular size is mildly enlarged.   3. Left atrial size was moderately dilated. No left atrial/left atrial  appendage thrombus was detected.   4. Right atrial size was mildly dilated.   5. There is severe posteriorly directed eccentric mitral valve  regurgitation secondary to restriction of the posterior mitral valve  leaflet and poor leaflet coaptation. There is blunting of right upper  pulmonary vein flow. BP during study 100s/60s.   6. Tricuspid valve regurgitation is mild to moderate.   7. The aortic valve is normal in structure. Aortic valve regurgitation is  not visualized.  Cardiac monitor on 12/17/2021: .  30 day monitor. Data available from 75% of planned monitored time .  Min HR 61, Max HR 159, Avg HR 81 .  Symptoms correlated with sinus rhythm, rare PVCs. .  Short runs of SVT at times with aberrancy without symptoms reported. 8 and 4  beat run of NSVT no reported symptoms  2D echocardiogram on September 24, 2021: 1. Left ventricular ejection fraction, by estimation, is 30 to 35%. The  left ventricle has moderately decreased function. Left ventricular  endocardial border not optimally defined to evaluate regional wall motion,  global hypokinesis seen. The left  ventricular internal cavity size was moderately dilated. There is mild  left ventricular hypertrophy. Left ventricular diastolic parameters are  indeterminate.   2. Right ventricular systolic function is mildly reduced. The right  ventricular size is mildly enlarged. There is mildly elevated pulmonary  artery systolic pressure. The estimated right ventricular systolic  pressure is 36.3 mmHg.   3. Left atrial size was moderately dilated.   4. Right atrial size was mildly dilated.   5. MR mechanism appears to be posterior leaflet restriction with relative  anterior leaflet override  with posteriorly directed jet of MR. Review of  serial studies suggests regional wall motion abnormalities contribute to  posterior leaflet restriction. No   pulmonary vein reversal seen on this  study, systolic PV blunting.. The  mitral valve is grossly normal. Moderate to severe mitral valve  regurgitation. No evidence of mitral stenosis.   6. Tricuspid valve regurgitation is mild to moderate.   7. The aortic valve is tricuspid. Aortic valve regurgitation is not  visualized. No aortic stenosis is present.   8. The inferior vena cava is dilated in size with <50% respiratory  variability, suggesting right atrial pressure of 15 mmHg.  Right and left heart cath on Jun 19, 2021: 40 to 50% proximal right coronary narrowing. Right dominant anatomy Coronary arteries otherwise normal Significant elevation LVEDP 30 mmHg consistent with acute on chronic combined systolic and diastolic heart failure Mild pulmonary hypertension, mean pressure 30 mmHg.  WHO group II etiology based on hemodysnamics.. Capillary wedge mean 24 mmHg.  V wave to 40 mmHg suggesting some degree of mitral regurgitation. Cardiac output 8.5 L/min with index 3.57 L/min/m. Pulmonary resistance 0.7 Wood units.   RECOMMENDATIONS: Care with IV fluid administration. Needs diuresis. Start heart failure therapy including ARNI and SGLT2 as tolerated.  Peripheral vascular thrombectomy on 02/17/2021: Completion venogram of the left leg demonstrated resolution of the thrombotic burden with no flow-limiting stenosis appreciated through the common femoral vein and femoral vein.  There was narrowing appreciated in the external iliac vein, however as noted on previous CT, this appears to be due to to fibroids.  I elected not to stent the patient, and will have her see obstetrics and gynecology.  I do not think this was the nidus for her thrombus, but rather having a subtherapeutic INR.   Yanitza tolerated the case well.  The percutaneous  access site was closed with 4-0 Monocryl suture.  Custom compression stockings have been ordered, and the leg was wrapped postoperatively.   Venous ultrasound of lower left extremity on January 30, 2021: Acute, nearly occlusive thrombus extending from the left popliteal vein through the common femoral vein. Extent of thrombus central to the inguinal ligament is unable to be determined on this study. Poor visualization of the calf veins.   Recommend CTV abdomen pelvis to evaluate for central extent of left lower extremity deep vein thrombosis. If thrombus extends into the pelvis, recommend consultation to Interventional Radiology or other qualified endovascular specialist.   Recent Labs: 11/14/2021: TSH 3.122 02/13/2022: ALT 15 02/16/2022: Magnesium 2.1 03/11/2022: B Natriuretic Peptide 1,854.6 03/12/2022: BUN 20; Creatinine, Ser 1.03; Hemoglobin 10.3; Platelets 256; Potassium 3.8; Sodium 138  Recent Lipid Panel    Component Value Date/Time   CHOL 77 11/14/2021 0816   TRIG 35 11/14/2021 0816   HDL 27 (L) 11/14/2021 0816   CHOLHDL 2.9 11/14/2021 0816   VLDL 7 11/14/2021 0816   LDLCALC 43 11/14/2021 0816   LDLCALC 92 01/31/2017 1119    Physical Exam:    VS:  BP 104/70 (BP Location: Right Arm, Patient Position: Sitting, Cuff Size: Large)   Pulse 83   Ht 5\' 10"  (1.778 m)   Wt 253 lb 9.6 oz (115 kg)   SpO2 100%   BMI 36.39 kg/m     Wt Readings from Last 3 Encounters:  05/21/22 253 lb 9.6 oz (115 kg)  03/29/22 242 lb (109.8 kg)  03/12/22 264 lb 12.4 oz (120.1 kg)     GEN: Obese, 47 y.o. female in no acute distress HEENT: Normal NECK: No JVD; No carotid bruits CARDIAC: S1/S2, RRR, no murmurs, rubs, gallops; 2+ pulses; Focal swelling noted to right chest wall under collar bone, stable from last OV.  RESPIRATORY:  Clear to auscultation without rales, wheezing or rhonchi  MUSCULOSKELETAL:  Stable swelling of right breast; Mild, nonpitting edema, more noticed since last visit  (L>R), no deformity  SKIN: Warm and dry NEUROLOGIC:  Alert and oriented x 3 PSYCHIATRIC:  Normal affect   ASSESSMENT:    No diagnosis found.   PLAN:    In order of problems listed above:  HFrEF, NICM TTE 02/2022 EF 30-35%. Weight is up and dose have BLE swelling on exam. Etiology unclear for heart failure.  Cardiac MRI has been arranged and ordered previously.  Instructed to take Lasix 80 mg in AM, 40 mg in PM for the next 3 days, then return to normal.  Continue rest of GDMT. Low sodium diet, fluid restriction <2L, and daily weights encouraged. Educated to contact our office for weight gain of 2 lbs overnight or 5 lbs in one week. Continue pulmonary rehab.  Follow-up with HF clinic. Will obtain BMET, Mag, and BNP in 2 weeks.   Hx of syncope, hx of NSVT Etiology felt to be vasovagal. Denies any recurrence or symptoms. Past monitor revealed NSVT/PVC's. Continue current medication regimen.  Cardiac MRI has been previously arranged as mentioned above.  Heart healthy diet recommended. Continue to follow-up with EP.   CAD Stable with no anginal symptoms. No indication for ischemic evaluation.  Previous cardiac catheterization 05/2021 revealed nonobstructive CAD.  Not on aspirin due to being on Coumadin.  Continue atorvastatin, losartan, and Toprol-XL.  Heart healthy diet recommended.  HTN Blood pressure soft. Continue current medication regimen.   Discussed to monitor BP at home at least 2 hours after medications and sitting for 5-10 minutes. Heart healthy diet recommended.   Hx of PE/DVT, left leg swelling Previous history of unprovoked saddle PE in 2015, hx of recurrent unprovoked DVT with hx of thrombectomy in 01/2021. Has IVC filter placed, could not be removed previously d/t significant thrombus burden on filter. Denies any bleeding issues while on Coumadin.  Continue to follow-up with Coumadin clinic.  Has hx of recurrent left lower extremity DVT.   6. Pulmonary hypertension Etiology most  likely d/t CHF. RHC revealed moderately elevated PA mean and PVR secondary to combined pre and postcapillary pulmonary hypertension, was recommended to increase outpatient diuretics and to start evaluation for mitral valve replacement/repair. Denies any recent [facility]. Adjusting diuretics as mentioned above.   7. Severe mitr  6. Obesity BMI 36.39. Weight loss via diet and exercise as tolerated encouraged. Discussed the impact being overweight would have on cardiovascular risk.  7. Focal swelling along right chest wall Noted below collar bone, said has been there for several months, stable since last OV. Previously arranged non-contrast CT scan of chest, currently declines arranging at this time. Continue to follow with PCP.  8.  Disposition: Follow up with me or APP  in 6-8 weeks. Follow up with Dr. Dina Rich in 6 months.   Does appear slightly volume overloaded.  Will increase Lasix for 3 days.  She will take 80 mg of Lasix in the morning and 40 mg of Lasix in the afternoon and returning normal.  In the next 2 weeks we will check labs: Bement, magnesium, proBNP.  No other medication changes.  I recommended she follow-up with PCP regarding left lower extremity pain, may need orthopedic referral.  Follow-up with me in 2 to 3 months.  Medication Adjustments/Labs and Tests Ordered: Current medicines are reviewed at length with the patient today.  Concerns regarding medicines are outlined above.  No orders  of the defined types were placed in this encounter.  No orders of the defined types were placed in this encounter.   There are no Patient Instructions on file for this visit.   Signed, Sharlene Dory, NP  05/21/2022 8:42 AM    [facility]

## 2022-05-23 ENCOUNTER — Encounter (HOSPITAL_COMMUNITY): Payer: Medicare HMO

## 2022-05-27 ENCOUNTER — Ambulatory Visit: Payer: Medicare HMO | Attending: Cardiology | Admitting: *Deleted

## 2022-05-27 DIAGNOSIS — Z86711 Personal history of pulmonary embolism: Secondary | ICD-10-CM | POA: Diagnosis not present

## 2022-05-27 DIAGNOSIS — Z5181 Encounter for therapeutic drug level monitoring: Secondary | ICD-10-CM

## 2022-05-27 DIAGNOSIS — Z86718 Personal history of other venous thrombosis and embolism: Secondary | ICD-10-CM | POA: Diagnosis not present

## 2022-05-27 LAB — POCT INR: INR: 1.6 — AB (ref 2.0–3.0)

## 2022-05-27 NOTE — Patient Instructions (Signed)
Take warfarin 1 1/2 tablets tonight then increase dose to 1 tablet daily except 1/2 tablet on Sundays and Thursdays Recheck in 2 wks

## 2022-05-28 ENCOUNTER — Encounter (HOSPITAL_COMMUNITY): Payer: Medicare HMO

## 2022-05-30 ENCOUNTER — Encounter (HOSPITAL_COMMUNITY): Payer: Medicare HMO

## 2022-06-04 ENCOUNTER — Encounter (HOSPITAL_COMMUNITY): Payer: Medicare HMO

## 2022-06-06 ENCOUNTER — Encounter (HOSPITAL_COMMUNITY): Payer: Medicare HMO

## 2022-06-11 ENCOUNTER — Encounter (HOSPITAL_COMMUNITY): Payer: Medicare HMO

## 2022-06-12 ENCOUNTER — Ambulatory Visit: Payer: Medicare HMO | Attending: Cardiology

## 2022-06-13 ENCOUNTER — Encounter (HOSPITAL_COMMUNITY): Payer: Medicare HMO

## 2022-06-18 ENCOUNTER — Encounter (HOSPITAL_COMMUNITY): Payer: Medicare HMO

## 2022-06-20 ENCOUNTER — Encounter (HOSPITAL_COMMUNITY): Payer: Medicare HMO

## 2022-06-21 ENCOUNTER — Ambulatory Visit: Payer: Medicare HMO | Attending: Cardiovascular Disease | Admitting: Cardiovascular Disease

## 2022-06-21 ENCOUNTER — Encounter: Payer: Self-pay | Admitting: Cardiovascular Disease

## 2022-06-21 ENCOUNTER — Telehealth: Payer: Self-pay

## 2022-06-21 NOTE — Telephone Encounter (Signed)
The patient has no-showed to two Structural Heart consults and did not go to her CHF Clinic follow-up.  Will follow and reschedule the patient after she is seen by CHF for check up and med optimization.

## 2022-06-25 ENCOUNTER — Encounter (HOSPITAL_COMMUNITY): Payer: Medicare HMO

## 2022-06-27 ENCOUNTER — Encounter (HOSPITAL_COMMUNITY): Payer: Medicare HMO

## 2022-07-02 ENCOUNTER — Emergency Department (HOSPITAL_COMMUNITY): Payer: Medicare HMO

## 2022-07-02 ENCOUNTER — Encounter (HOSPITAL_COMMUNITY): Payer: Medicare HMO

## 2022-07-02 ENCOUNTER — Inpatient Hospital Stay (HOSPITAL_COMMUNITY)
Admission: EM | Admit: 2022-07-02 | Discharge: 2022-07-09 | DRG: 291 | Disposition: A | Discharge status: Home / Self Care | Payer: Medicare HMO | Attending: Family Medicine | Admitting: Family Medicine

## 2022-07-02 ENCOUNTER — Other Ambulatory Visit: Payer: Self-pay

## 2022-07-02 ENCOUNTER — Encounter (HOSPITAL_COMMUNITY): Payer: Self-pay | Admitting: *Deleted

## 2022-07-02 DIAGNOSIS — I5020 Unspecified systolic (congestive) heart failure: Secondary | ICD-10-CM | POA: Diagnosis not present

## 2022-07-02 DIAGNOSIS — J811 Chronic pulmonary edema: Secondary | ICD-10-CM | POA: Diagnosis not present

## 2022-07-02 DIAGNOSIS — I5023 Acute on chronic systolic (congestive) heart failure: Secondary | ICD-10-CM | POA: Diagnosis present

## 2022-07-02 DIAGNOSIS — E66813 Obesity, class 3: Secondary | ICD-10-CM | POA: Diagnosis present

## 2022-07-02 DIAGNOSIS — Z79899 Other long term (current) drug therapy: Secondary | ICD-10-CM | POA: Diagnosis not present

## 2022-07-02 DIAGNOSIS — Z95828 Presence of other vascular implants and grafts: Secondary | ICD-10-CM | POA: Diagnosis not present

## 2022-07-02 DIAGNOSIS — I2510 Atherosclerotic heart disease of native coronary artery without angina pectoris: Secondary | ICD-10-CM | POA: Diagnosis present

## 2022-07-02 DIAGNOSIS — I110 Hypertensive heart disease with heart failure: Secondary | ICD-10-CM | POA: Diagnosis not present

## 2022-07-02 DIAGNOSIS — J189 Pneumonia, unspecified organism: Secondary | ICD-10-CM | POA: Diagnosis not present

## 2022-07-02 DIAGNOSIS — E8770 Fluid overload, unspecified: Secondary | ICD-10-CM | POA: Diagnosis not present

## 2022-07-02 DIAGNOSIS — I428 Other cardiomyopathies: Secondary | ICD-10-CM | POA: Diagnosis present

## 2022-07-02 DIAGNOSIS — I493 Ventricular premature depolarization: Secondary | ICD-10-CM | POA: Diagnosis present

## 2022-07-02 DIAGNOSIS — F17210 Nicotine dependence, cigarettes, uncomplicated: Secondary | ICD-10-CM | POA: Diagnosis present

## 2022-07-02 DIAGNOSIS — R079 Chest pain, unspecified: Secondary | ICD-10-CM | POA: Diagnosis not present

## 2022-07-02 DIAGNOSIS — Z7901 Long term (current) use of anticoagulants: Secondary | ICD-10-CM | POA: Diagnosis not present

## 2022-07-02 DIAGNOSIS — E669 Obesity, unspecified: Secondary | ICD-10-CM | POA: Diagnosis not present

## 2022-07-02 DIAGNOSIS — E871 Hypo-osmolality and hyponatremia: Secondary | ICD-10-CM | POA: Diagnosis not present

## 2022-07-02 DIAGNOSIS — I272 Pulmonary hypertension, unspecified: Secondary | ICD-10-CM | POA: Diagnosis present

## 2022-07-02 DIAGNOSIS — I509 Heart failure, unspecified: Secondary | ICD-10-CM | POA: Diagnosis not present

## 2022-07-02 DIAGNOSIS — Z86718 Personal history of other venous thrombosis and embolism: Secondary | ICD-10-CM

## 2022-07-02 DIAGNOSIS — D259 Leiomyoma of uterus, unspecified: Secondary | ICD-10-CM | POA: Diagnosis not present

## 2022-07-02 DIAGNOSIS — J9601 Acute respiratory failure with hypoxia: Secondary | ICD-10-CM | POA: Diagnosis not present

## 2022-07-02 DIAGNOSIS — Z7984 Long term (current) use of oral hypoglycemic drugs: Secondary | ICD-10-CM | POA: Diagnosis not present

## 2022-07-02 DIAGNOSIS — Z833 Family history of diabetes mellitus: Secondary | ICD-10-CM

## 2022-07-02 DIAGNOSIS — F17200 Nicotine dependence, unspecified, uncomplicated: Secondary | ICD-10-CM | POA: Diagnosis present

## 2022-07-02 DIAGNOSIS — I349 Nonrheumatic mitral valve disorder, unspecified: Secondary | ICD-10-CM | POA: Diagnosis present

## 2022-07-02 DIAGNOSIS — E875 Hyperkalemia: Secondary | ICD-10-CM | POA: Diagnosis not present

## 2022-07-02 DIAGNOSIS — J810 Acute pulmonary edema: Secondary | ICD-10-CM | POA: Diagnosis not present

## 2022-07-02 DIAGNOSIS — Z6836 Body mass index (BMI) 36.0-36.9, adult: Secondary | ICD-10-CM

## 2022-07-02 DIAGNOSIS — Z23 Encounter for immunization: Secondary | ICD-10-CM | POA: Diagnosis not present

## 2022-07-02 DIAGNOSIS — I2699 Other pulmonary embolism without acute cor pulmonale: Secondary | ICD-10-CM | POA: Diagnosis not present

## 2022-07-02 DIAGNOSIS — Z1152 Encounter for screening for COVID-19: Secondary | ICD-10-CM | POA: Diagnosis not present

## 2022-07-02 DIAGNOSIS — Z86711 Personal history of pulmonary embolism: Secondary | ICD-10-CM

## 2022-07-02 DIAGNOSIS — D508 Other iron deficiency anemias: Secondary | ICD-10-CM | POA: Diagnosis not present

## 2022-07-02 DIAGNOSIS — D649 Anemia, unspecified: Secondary | ICD-10-CM | POA: Diagnosis present

## 2022-07-02 DIAGNOSIS — Z8249 Family history of ischemic heart disease and other diseases of the circulatory system: Secondary | ICD-10-CM

## 2022-07-02 DIAGNOSIS — I5043 Acute on chronic combined systolic (congestive) and diastolic (congestive) heart failure: Secondary | ICD-10-CM | POA: Diagnosis not present

## 2022-07-02 DIAGNOSIS — G4733 Obstructive sleep apnea (adult) (pediatric): Secondary | ICD-10-CM | POA: Diagnosis present

## 2022-07-02 DIAGNOSIS — G894 Chronic pain syndrome: Secondary | ICD-10-CM | POA: Diagnosis present

## 2022-07-02 DIAGNOSIS — I340 Nonrheumatic mitral (valve) insufficiency: Secondary | ICD-10-CM | POA: Diagnosis not present

## 2022-07-02 DIAGNOSIS — J168 Pneumonia due to other specified infectious organisms: Secondary | ICD-10-CM | POA: Diagnosis not present

## 2022-07-02 DIAGNOSIS — R0600 Dyspnea, unspecified: Secondary | ICD-10-CM | POA: Diagnosis not present

## 2022-07-02 DIAGNOSIS — Z882 Allergy status to sulfonamides status: Secondary | ICD-10-CM

## 2022-07-02 LAB — TROPONIN I (HIGH SENSITIVITY)
Troponin I (High Sensitivity): 22 ng/L — ABNORMAL HIGH (ref <18)
Troponin I (High Sensitivity): 21 ng/L — ABNORMAL HIGH (ref <18)

## 2022-07-02 LAB — CBC
HCT: 37.2 % (ref 36.0–46.0)
Hemoglobin: 11.2 g/dL — ABNORMAL LOW (ref 12.0–15.0)
MCH: 24.8 pg — ABNORMAL LOW (ref 26.0–34.0)
MCHC: 30.1 g/dL (ref 30.0–36.0)
MCV: 82.5 fL (ref 80.0–100.0)
Platelets: 157 10*3/uL (ref 150–400)
RBC: 4.51 MIL/uL (ref 3.87–5.11)
RDW: 22.2 % — ABNORMAL HIGH (ref 11.5–15.5)
WBC: 6 10*3/uL (ref 4.0–10.5)
nRBC: 0 % (ref 0.0–0.2)

## 2022-07-02 LAB — RESP PANEL BY RT-PCR (RSV, FLU A&B, COVID)  RVPGX2
Influenza A by PCR: NEGATIVE
Influenza B by PCR: NEGATIVE
Resp Syncytial Virus by PCR: NEGATIVE
SARS Coronavirus 2 by RT PCR: NEGATIVE

## 2022-07-02 LAB — COMPREHENSIVE METABOLIC PANEL
ALT: 35 U/L (ref 0–44)
AST: 26 U/L (ref 15–41)
Albumin: 3 g/dL — ABNORMAL LOW (ref 3.5–5.0)
Alkaline Phosphatase: 83 U/L (ref 38–126)
Anion gap: 7 (ref 5–15)
BUN: 15 mg/dL (ref 6–20)
CO2: 25 mmol/L (ref 22–32)
Calcium: 8.3 mg/dL — ABNORMAL LOW (ref 8.9–10.3)
Chloride: 101 mmol/L (ref 98–111)
Creatinine, Ser: 0.81 mg/dL (ref 0.44–1.00)
GFR, Estimated: >60 mL/min (ref >60)
Glucose, Bld: 106 mg/dL — ABNORMAL HIGH (ref 70–99)
Potassium: 4 mmol/L (ref 3.5–5.1)
Sodium: 133 mmol/L — ABNORMAL LOW (ref 135–145)
Total Bilirubin: 1.8 mg/dL — ABNORMAL HIGH (ref 0.3–1.2)
Total Protein: 7.3 g/dL (ref 6.5–8.1)

## 2022-07-02 LAB — BRAIN NATRIURETIC PEPTIDE: B Natriuretic Peptide: 1225 pg/mL — ABNORMAL HIGH (ref 0.0–100.0)

## 2022-07-02 LAB — PROTIME-INR
INR: 1.4 — ABNORMAL HIGH (ref 0.8–1.2)
Prothrombin Time: 17 seconds — ABNORMAL HIGH (ref 11.4–15.2)

## 2022-07-02 LAB — HCG, QUANTITATIVE, PREGNANCY: hCG, Beta Chain, Quant, S: <1 m[IU]/mL (ref <5)

## 2022-07-02 MED ORDER — PNEUMOCOCCAL 20-VAL CONJ VACC 0.5 ML IM SUSY
0.5000 mL | PREFILLED_SYRINGE | INTRAMUSCULAR | Status: AC
  Administered 2022-07-04: 0.5 mL via INTRAMUSCULAR
  Filled 2022-07-02: qty 0.5

## 2022-07-02 MED ORDER — SODIUM CHLORIDE 0.9 % IV SOLN: 250.0000 mL | INTRAVENOUS | Status: DC | PRN

## 2022-07-02 MED ORDER — WARFARIN SODIUM 5 MG PO TABS
10.0000 mg | ORAL_TABLET | Freq: Once | ORAL | Status: AC
  Administered 2022-07-02: 10 mg via ORAL
  Filled 2022-07-02: qty 2

## 2022-07-02 MED ORDER — IPRATROPIUM-ALBUTEROL 0.5-2.5 (3) MG/3ML IN SOLN
3.0000 mL | Freq: Once | RESPIRATORY_TRACT | Status: AC
  Administered 2022-07-02: 3 mL via RESPIRATORY_TRACT
  Filled 2022-07-02: qty 3

## 2022-07-02 MED ORDER — FUROSEMIDE 10 MG/ML IJ SOLN
80.0000 mg | Freq: Two times a day (BID) | INTRAMUSCULAR | Status: DC
  Administered 2022-07-02 – 2022-07-08 (×13): 80 mg via INTRAVENOUS
  Filled 2022-07-02 (×15): qty 8

## 2022-07-02 MED ORDER — METOPROLOL SUCCINATE ER 50 MG PO TB24
25.0000 mg | ORAL_TABLET | Freq: Every day | ORAL | Status: DC
  Administered 2022-07-02 – 2022-07-03 (×2): 25 mg via ORAL
  Filled 2022-07-02 (×4): qty 1

## 2022-07-02 MED ORDER — AZITHROMYCIN 250 MG PO TABS
250.0000 mg | ORAL_TABLET | Freq: Every day | ORAL | Status: AC
  Administered 2022-07-03 – 2022-07-06 (×4): 250 mg via ORAL
  Filled 2022-07-02 (×4): qty 1

## 2022-07-02 MED ORDER — FUROSEMIDE 10 MG/ML IJ SOLN
80.0000 mg | Freq: Once | INTRAMUSCULAR | Status: AC
  Administered 2022-07-02: 80 mg via INTRAVENOUS
  Filled 2022-07-02: qty 8

## 2022-07-02 MED ORDER — SPIRONOLACTONE 25 MG PO TABS
25.0000 mg | ORAL_TABLET | Freq: Every day | ORAL | Status: DC
  Administered 2022-07-02 – 2022-07-04 (×3): 25 mg via ORAL
  Filled 2022-07-02 (×4): qty 1

## 2022-07-02 MED ORDER — ATORVASTATIN CALCIUM 40 MG PO TABS
20.0000 mg | ORAL_TABLET | Freq: Every day | ORAL | Status: DC
  Administered 2022-07-02 – 2022-07-09 (×8): 20 mg via ORAL
  Filled 2022-07-02 (×8): qty 1

## 2022-07-02 MED ORDER — LOSARTAN POTASSIUM 25 MG PO TABS
12.5000 mg | ORAL_TABLET | Freq: Every day | ORAL | Status: DC
  Administered 2022-07-02 – 2022-07-09 (×7): 12.5 mg via ORAL
  Filled 2022-07-02 (×8): qty 1

## 2022-07-02 MED ORDER — ONDANSETRON HCL 4 MG/2ML IJ SOLN: 4.0000 mg | Freq: Four times a day (QID) | INTRAMUSCULAR | Status: DC | PRN

## 2022-07-02 MED ORDER — SODIUM CHLORIDE 0.9 % IV SOLN
500.0000 mg | Freq: Once | INTRAVENOUS | Status: AC
  Administered 2022-07-02: 500 mg via INTRAVENOUS
  Filled 2022-07-02: qty 5

## 2022-07-02 MED ORDER — WARFARIN - PHARMACIST DOSING INPATIENT: Freq: Every day | Status: DC

## 2022-07-02 MED ORDER — SODIUM CHLORIDE 0.9% FLUSH: 3.0000 mL | INTRAVENOUS | Status: DC | PRN

## 2022-07-02 MED ORDER — SODIUM CHLORIDE 0.9% FLUSH
3.0000 mL | Freq: Two times a day (BID) | INTRAVENOUS | Status: DC
  Administered 2022-07-02 – 2022-07-08 (×13): 3 mL via INTRAVENOUS

## 2022-07-02 MED ORDER — SODIUM CHLORIDE 0.9 % IV SOLN
1.0000 g | Freq: Once | INTRAVENOUS | Status: AC
  Administered 2022-07-02: 1 g via INTRAVENOUS
  Filled 2022-07-02: qty 10

## 2022-07-02 MED ORDER — POTASSIUM CHLORIDE CRYS ER 20 MEQ PO TBCR
20.0000 meq | EXTENDED_RELEASE_TABLET | Freq: Two times a day (BID) | ORAL | Status: DC
  Administered 2022-07-02 – 2022-07-04 (×5): 20 meq via ORAL
  Filled 2022-07-02 (×6): qty 1

## 2022-07-02 MED ORDER — ACETAMINOPHEN 325 MG PO TABS
650.0000 mg | ORAL_TABLET | ORAL | Status: DC | PRN
  Administered 2022-07-03 – 2022-07-09 (×7): 650 mg via ORAL
  Filled 2022-07-02 (×7): qty 2

## 2022-07-02 NOTE — H&P (Signed)
History and Physical    [name] [name]:[id number] DOB: 1975/12/21 DOA: 07/02/2022  PCP: Benetta Spar, MD   Chief Complaint: Shortness of breath, chest pain right-sided  HPI: [name] [name] is a 47 y.o. female with medical history significant of heart failure with reduced ejection fraction (last echo February 2024 EF 30 to 35%), nonsustained ventricular tachycardia, CAD, hypertension, history of PE/DVT, pulmonary hypertension, severe mitral regurgitation, obesity.  Patient presents to the hospital with worsening dyspnea, orthopnea, bilateral lower extremity edema.  ED Course: Patient found to be hypoxic requiring 4 L nasal cannula to maintain sats and to improve dyspnea, imaging remarkable for bilateral pleural effusions with questionable right upper lobe opacity concerning for possible pneumonia.  Review of Systems: As per HPI otherwise unremarkable.   Assessment/Plan Principal Problem:   Acute exacerbation of congestive heart failure (HCC) Active Problems:   CAP (community acquired pneumonia)   Anemia   Tobacco use disorder   Obesity, Class III, BMI 40-49.9 (morbid obesity) (HCC)   Uterine leiomyoma   Bilateral pulmonary embolism (HCC)   Chronic anticoagulation   Pulmonary edema   Acute on chronic HFrEF (heart failure with reduced ejection fraction) (HCC)    Acute hypoxic respiratory failure Acute systolic heart failure exacerbation -Continue to wean nasal cannula and oxygen as tolerated -Heart failure team consulted, will attempt to set patient up with outpatient clinic follow-up given this is her third admission this year so far -Continue Lasix 80 IV twice daily, follow I's and O's urine output Daily weights -Lengthy discussion at bedside on dietary and medication compliance -dietary compliance likely primary driving factor behind multiple hospitalizations (patient continues to eat canned vegetables/similar) -Repeat echo pending -Hold off on cardiology consult,  heart failure team notified, will likely follow-up outpatient pending patient's improvement -Continue home core measures including metoprolol and losartan, patient also on spironolactone.  Questionable right upper lobe pneumonia, community-acquired -Right upper lobe fluid level concerning for pneumonia although could certainly be related to the volume status given above -Patient is without leukocytosis fever chills cough or sputum production. -Azithromycin 250 mg x 4 days to complete course, no indication for IV antibiotics or escalation of antibiotics at this time.  No recent hospitalizations, not immunocompromised and no recent antibiotic use concerning for failure of outpatient therapy. -Incentive spirometry and flutter encouraged  CAD Hypertension Severe mitral regurgitation Pulmonary hypertension Severe mitral regurgitation -Blood pressure currently well-controlled on current regimen, continue metoprolol, losartan, statin -Patient on warfarin as below  Obesity, morbid -Discussed lifestyle changes, patient understands increased activity and caloric restriction would likely improve her outcomes as well as decrease her morbidity mortality.  History of PE/DVT on chronic anticoagulation -Continue warfarin, defer to pharmacy for dosing, goal INR 2(currently 1.4)  DVT prophylaxis: Warfarin Code Status: Full Family Communication: None present Status is: Inpatient  Dispo: The patient is from: Home              Anticipated d/c is to: Home              Anticipated d/c date is: 48 to 72 hours              Patient currently not medically stable for discharge given ongoing need for IV diuretics close monitoring oxygen supplementation and likely repeat imaging  Consultants:  None  Procedures:  None   Past Medical History:  Diagnosis Date   Anemia    Blood transfusion without reported diagnosis    CAD (coronary artery disease)    Nonobstructive  Closed left ankle fracture     August 30 2012   Clotting disorder [facility])    DVT (deep venous thrombosis) (HCC)    L leg   Fibroid tumor    HFrEF (heart failure with reduced ejection fraction) (HCC)    Hidradenitis    Hypertension    Mitral regurgitation    NICM (nonischemic cardiomyopathy) (HCC)    NSVT (nonsustained ventricular tachycardia) (HCC)    Obesity    OSA on CPAP    Pulmonary embolism (HCC) 04/2013   Sleep apnea     Past Surgical History:  Procedure Laterality Date   ABSCESS DRAINAGE Bilateral 01/10/2015   BREAST BIOPSY Right 02/01/2020   benign   COLONOSCOPY WITH PROPOFOL N/A 06/08/2018   Procedure: COLONOSCOPY WITH PROPOFOL;  Surgeon: Corbin Ade, MD;  Location: [facility] ENDO [facility]ITE;  Service: Endoscopy;  Laterality: N/A;  2:30pm   CYST REMOVAL TRUNK     IVC FILTER PLACEMENT ([facility] HX)     LOWER EXTREMITY VENOGRAPHY N/A 02/07/2021   Procedure: LOWER EXTREMITY VENOGRAPHY;  Surgeon: Victorino Sparrow, MD;  Location: [facility] INVASIVE CV LAB;  Service: Cardiovascular;  Laterality: N/A;   PERIPHERAL VASCULAR THROMBECTOMY N/A 02/07/2021   Procedure: PERIPHERAL VASCULAR THROMBECTOMY;  Surgeon: Victorino Sparrow, MD;  Location: [facility] INVASIVE CV LAB;  Service: Cardiovascular;  Laterality: N/A;   RIGHT HEART CATH N/A 03/08/2022   Procedure: RIGHT HEART CATH;  Surgeon: Dorthula Nettles, DO;  Location: [facility] INVASIVE CV LAB;  Service: Cardiovascular;  Laterality: N/A;   RIGHT/LEFT HEART CATH AND CORONARY ANGIOGRAPHY N/A 06/19/2021   Procedure: RIGHT/LEFT HEART CATH AND CORONARY ANGIOGRAPHY;  Surgeon: Lyn Records, MD;  Location: [facility] INVASIVE CV LAB;  Service: Cardiovascular;  Laterality: N/A;   TEE WITHOUT CARDIOVERSION N/A 03/08/2022   Procedure: TRANSESOPHAGEAL ECHOCARDIOGRAM (TEE);  Surgeon: Dorthula Nettles, DO;  Location: [facility] ENDOSCOPY;  Service: Cardiovascular;  Laterality: N/A;     reports that she has been smoking cigarettes. She has never been exposed to tobacco smoke. She has never used smokeless tobacco. She  reports current alcohol use of about 2.0 standard drinks of alcohol per week. She reports that she does not use drugs.  Allergies  Allergen Reactions   Sulfa Antibiotics Itching, Swelling and Rash    Lip swelling    Family History  Problem Relation Age of Onset   Hypertension Mother    Diabetes Paternal Aunt    Diabetes Paternal Uncle    Other Father        blood clots   Cancer Maternal Grandmother    Heart attack Paternal Grandmother    Colon cancer Neg Hx     Prior to Admission medications   Medication Sig Start Date End Date Taking? Authorizing Provider  albuterol (VENTOLIN HFA) 108 (90 Base) MCG/ACT inhaler Inhale 2 puffs into the lungs every 2 (two) hours as needed for wheezing or shortness of breath. 09/13/21  Yes Oretha Milch, MD  atorvastatin (LIPITOR) 20 MG tablet Take 1 tablet (20 mg total) by mouth daily. 09/10/21  Yes Branch, Dorothe Pea, MD  empagliflozin (JARDIANCE) 10 MG TABS tablet Take 1 tablet (10 mg total) by mouth daily before breakfast. 01/30/22  Yes [name], [name], DO  furosemide (LASIX) 40 MG tablet Take 40 mg by mouth 2 (two) times daily.   Yes [provider]  losartan (COZAAR) 25 MG tablet Take 0.5 tablets (12.5 mg total) by mouth daily. 01/11/22 01/06/23 Yes Erick Blinks, MD  metoprolol succinate (TOPROL-XL) 25 MG 24 hr tablet  Take 1 tablet (25 mg total) by mouth daily. Take with or immediately following a meal. 01/11/22 01/06/23 Yes Memon, Durward Mallard, MD  midodrine (PROAMATINE) 2.5 MG tablet Take 1 tablet (2.5 mg total) by mouth 2 (two) times daily with a meal. 03/01/22  Yes Sharlene Dory, NP  potassium chloride SA (KLOR-CON M) 20 MEQ tablet Take 20 mEq by mouth 2 (two) times daily. 03/13/22  Yes [provider]  spironolactone (ALDACTONE) 25 MG tablet Take 1 tablet (25 mg total) by mouth daily. 03/11/22  Yes [name], [name], DO  warfarin (COUMADIN) 10 MG tablet Take 1/2 tablet to 1 tablet daily or as directed by coumadin clinic. Patient  taking differently: Take 5-10 mg by mouth See admin instructions. 5mg  tues and thurs 10mg  all other days 03/15/22  Yes [name], Eliezer Lofts, DO    Physical Exam: Vitals:   07/02/22 1418 07/02/22 1500 07/02/22 1500 07/02/22 1640  BP: (!) 130/93 (!) 130/90  117/88  Pulse: 81 83  91  Resp: (!) 22 (!) 28  19  Temp:   98.4 F (36.9 C) 97.8 F (36.6 C)  TempSrc:   Oral Oral  SpO2: 100% 97%  99%  Weight:    130.8 kg  Height:    5\' 11"  (1.803 m)    Constitutional: NAD, calm, comfortable Vitals:   07/02/22 1418 07/02/22 1500 07/02/22 1500 07/02/22 1640  BP: (!) 130/93 (!) 130/90  117/88  Pulse: 81 83  91  Resp: (!) 22 (!) 28  19  Temp:   98.4 F (36.9 C) 97.8 F (36.6 C)  TempSrc:   Oral Oral  SpO2: 100% 97%  99%  Weight:    130.8 kg  Height:    5\' 11"  (1.803 m)   General: Sitting upright in bed, No acute distress. HEENT:  Normocephalic atraumatic.  Sclerae nonicteric, noninjected.  Extraocular movements intact bilaterally. Neck:  Without mass or deformity.  Trachea is midline. Lungs: Profound bilateral rales no overt wheeze or rhonchi noted. Heart:  Regular rate and rhythm.  Noted systolic murmur without rub or gallop. Abdomen:  Soft, nontender, nondistended.  Without guarding or rebound. Extremities: Without cyanosis, clubbing, 2+ pitting edema bilateral upper and lower extremities Skin:  Warm and dry, no erythema  Labs on Admission: I have personally reviewed following labs and imaging studies  CBC: Recent Labs  Lab 07/02/22 1100  WBC 6.0  HGB 11.2*  HCT 37.2  MCV 82.5  PLT 157   Basic Metabolic Panel: Recent Labs  Lab 07/02/22 1135  NA 133*  K 4.0  CL 101  CO2 25  GLUCOSE 106*  BUN 15  CREATININE 0.81  CALCIUM 8.3*   GFR: Estimated Creatinine Clearance: 131.3 mL/min (by C-G formula based on SCr of 0.81 mg/dL).  Liver Function Tests: Recent Labs  Lab 07/02/22 1135  AST 26  ALT 35  ALKPHOS 83  BILITOT 1.8*  PROT 7.3  ALBUMIN 3.0*   Coagulation  Profile: Recent Labs  Lab 07/02/22 1100  INR 1.4*   Urine analysis:    Component Value Date/Time   COLORURINE YELLOW 09/22/2021 1506   APPEARANCEUR HAZY (A) 09/22/2021 1506   APPEARANCEUR Cloudy (A) 07/21/2014 1326   LABSPEC 1.008 09/22/2021 1506   PHURINE 6.0 09/22/2021 1506   GLUCOSEU NEGATIVE 09/22/2021 1506   HGBUR NEGATIVE 09/22/2021 1506   BILIRUBINUR NEGATIVE 09/22/2021 1506   BILIRUBINUR Negative 07/21/2014 1326   KETONESUR NEGATIVE 09/22/2021 1506   PROTEINUR NEGATIVE 09/22/2021 1506   UROBILINOGEN 0.2 06/30/2014 0910   NITRITE  NEGATIVE 09/22/2021 1506   LEUKOCYTESUR TRACE (A) 09/22/2021 1506    Radiological Exams on Admission: DG Chest 2 View  Result Date: 07/02/2022 CLINICAL DATA:  Chest pain EXAM: CHEST - 2 VIEW COMPARISON:  02/13/2022 FINDINGS: Cardiomegaly and pulmonary vascular congestion. Airspace consolidation within the inferior aspect of the right upper lobe abutting the horizontal fissure. Left lung is clear. No pleural effusion or pneumothorax. IMPRESSION: 1. Right upper lobe pneumonia. 2. Cardiomegaly and pulmonary vascular congestion. Electronically Signed   By: Duanne Guess D.O.   On: 07/02/2022 12:56    EKG: Independently reviewed.    Azucena Fallen DO [facility] For contact please use secure messenger on Epic  If 7PM-7AM, please contact night-coverage located on [email or web address]   07/02/2022, 5:31 PM

## 2022-07-02 NOTE — ED Triage Notes (Signed)
Pt c/o chest pain and worsening SOB that started today. Pt reports the SOB has been going on since Friday.

## 2022-07-02 NOTE — ED Provider Notes (Signed)
[facility] EMERGENCY DEPARTMENT AT [facility] Provider Note   CSN: [id number] Arrival date & time: 07/02/22  1002     History  Chief Complaint  Patient presents with   Chest Pain    [name] [name] is a 47 y.o. female with history of prior PE on Warfarin s/p IVC filter, iron deficiency anemia, chronic pain syndrome, HTN, CHF, (EF 30-35%, echo Feb 2024), severe mitral regurgitation, OSA on CPAP, CAD who presents to the ER complaining of chest pain and shortness of breath. Shortness of breath has been going on for about 5 days, chest pain started today.  Reports she has been taking her Lasix as prescribed, and urinating well, but still feels as though she is holding onto fluid.  She does not require oxygen at home, but intermittently uses her inhaler.  Has been needing it more frequently.   Chest Pain Associated symptoms: cough, fatigue and shortness of breath        Home Medications Prior to Admission medications   Medication Sig Start Date End Date Taking? Authorizing Provider  albuterol (VENTOLIN HFA) 108 (90 Base) MCG/ACT inhaler Inhale 2 puffs into the lungs every 2 (two) hours as needed for wheezing or shortness of breath. 09/13/21  Yes Oretha Milch, MD  atorvastatin (LIPITOR) 20 MG tablet Take 1 tablet (20 mg total) by mouth daily. 09/10/21  Yes Branch, Dorothe Pea, MD  empagliflozin (JARDIANCE) 10 MG TABS tablet Take 1 tablet (10 mg total) by mouth daily before breakfast. 01/30/22  Yes [name], [name], DO  furosemide (LASIX) 40 MG tablet Take 40 mg by mouth 2 (two) times daily.   Yes [provider]  losartan (COZAAR) 25 MG tablet Take 0.5 tablets (12.5 mg total) by mouth daily. 01/11/22 01/06/23 Yes Erick Blinks, MD  metoprolol succinate (TOPROL-XL) 25 MG 24 hr tablet Take 1 tablet (25 mg total) by mouth daily. Take with or immediately following a meal. 01/11/22 01/06/23 Yes Memon, Durward Mallard, MD  midodrine (PROAMATINE) 2.5 MG tablet Take 1 tablet (2.5 mg  total) by mouth 2 (two) times daily with a meal. 03/01/22  Yes Sharlene Dory, NP  potassium chloride SA (KLOR-CON M) 20 MEQ tablet Take 20 mEq by mouth 2 (two) times daily. 03/13/22  Yes [provider]  spironolactone (ALDACTONE) 25 MG tablet Take 1 tablet (25 mg total) by mouth daily. 03/11/22  Yes [name], [name], DO  warfarin (COUMADIN) 10 MG tablet Take 1/2 tablet to 1 tablet daily or as directed by coumadin clinic. Patient taking differently: Take 5-10 mg by mouth See admin instructions. 5mg  tues and thurs 10mg  all other days 03/15/22  Yes [name], [name], DO      Allergies    Sulfa antibiotics    Review of Systems   Review of Systems  Constitutional:  Positive for fatigue.  Respiratory:  Positive for cough, shortness of breath and wheezing.   Cardiovascular:  Positive for chest pain and leg swelling.  All other systems reviewed and are negative.   Physical Exam Updated Vital Signs BP (!) 130/93   Pulse 81   Temp 98.7 F (37.1 C) (Oral)   Resp (!) 22   Ht 5\' 10"  (1.778 m)   Wt 112.5 kg   SpO2 100%   BMI 35.58 kg/m  Physical Exam Vitals and nursing note reviewed.  Constitutional:      Appearance: Normal appearance.  HENT:     Head: Normocephalic and atraumatic.  Eyes:     Conjunctiva/sclera: Conjunctivae normal.  Cardiovascular:  Rate and Rhythm: Normal rate and regular rhythm.  Pulmonary:     Effort: No respiratory distress.     Breath sounds: Wheezing and rales present.     Comments: Increased respiratory effort.  Sitting upright in the exam bed.  Expiratory wheezing in all lung fields, with Rales in the lower bases Abdominal:     General: There is no distension.     Palpations: Abdomen is soft.     Tenderness: There is no abdominal tenderness.  Musculoskeletal:     Right lower leg: 2+ Edema present.     Left lower leg: 2+ Edema present.  Skin:    General: Skin is warm and dry.  Neurological:     General: No focal deficit present.     Mental  Status: She is alert.     ED Results / Procedures / Treatments   Labs (all labs ordered are listed, but only abnormal results are displayed) Labs Reviewed  CBC - Abnormal; Notable for the following components:      Result Value   Hemoglobin 11.2 (*)    MCH 24.8 (*)    RDW 22.2 (*)    All other components within normal limits  BRAIN NATRIURETIC PEPTIDE - Abnormal; Notable for the following components:   B Natriuretic Peptide 1,225.0 (*)    All other components within normal limits  PROTIME-INR - Abnormal; Notable for the following components:   Prothrombin Time 17.0 (*)    INR 1.4 (*)    All other components within normal limits  COMPREHENSIVE METABOLIC PANEL - Abnormal; Notable for the following components:   Sodium 133 (*)    Glucose, Bld 106 (*)    Calcium 8.3 (*)    Albumin 3.0 (*)    Total Bilirubin 1.8 (*)    All other components within normal limits  TROPONIN I (HIGH SENSITIVITY) - Abnormal; Notable for the following components:   Troponin I (High Sensitivity) 22 (*)    All other components within normal limits  TROPONIN I (HIGH SENSITIVITY) - Abnormal; Notable for the following components:   Troponin I (High Sensitivity) 21 (*)    All other components within normal limits  HCG, QUANTITATIVE, PREGNANCY    EKG EKG Interpretation  Date/Time:  Tuesday July 02 2022 10:13:58 EDT Ventricular Rate:  82 PR Interval:  152 QRS Duration: 105 QT Interval:  388 QTC Calculation: 454 R Axis:   -72 Text Interpretation: Sinus rhythm Probable left atrial enlargement LAD, consider left anterior fascicular block RVH with secondary repolarization abnrm similar to Jan 2024 Confirmed by Pricilla Loveless [phone]) on 07/02/2022 2:20:00 PM  Radiology DG Chest 2 View  Result Date: 07/02/2022 CLINICAL DATA:  Chest pain EXAM: CHEST - 2 VIEW COMPARISON:  02/13/2022 FINDINGS: Cardiomegaly and pulmonary vascular congestion. Airspace consolidation within the inferior aspect of the right upper  lobe abutting the horizontal fissure. Left lung is clear. No pleural effusion or pneumothorax. IMPRESSION: 1. Right upper lobe pneumonia. 2. Cardiomegaly and pulmonary vascular congestion. Electronically Signed   By: Duanne Guess D.O.   On: 07/02/2022 12:56    Procedures Procedures    Medications Ordered in ED Medications  cefTRIAXone (ROCEPHIN) 1 g in sodium chloride 0.9 % 100 mL IVPB (1 g Intravenous New Bag/Given 07/02/22 1455)  azithromycin (ZITHROMAX) 500 mg in sodium chloride 0.9 % 250 mL IVPB (has no administration in time range)  sodium chloride flush (NS) 0.9 % injection 3 mL (has no administration in time range)  sodium chloride flush (NS) 0.9 %  injection 3 mL (has no administration in time range)  0.9 %  sodium chloride infusion (has no administration in time range)  acetaminophen (TYLENOL) tablet 650 mg (has no administration in time range)  ondansetron (ZOFRAN) injection 4 mg (has no administration in time range)  furosemide (LASIX) injection 80 mg (has no administration in time range)  atorvastatin (LIPITOR) tablet 20 mg (has no administration in time range)  losartan (COZAAR) tablet 12.5 mg (has no administration in time range)  metoprolol succinate (TOPROL-XL) 24 hr tablet 25 mg (has no administration in time range)  spironolactone (ALDACTONE) tablet 25 mg (has no administration in time range)  potassium chloride SA (KLOR-CON M) CR tablet 20 mEq (has no administration in time range)  furosemide (LASIX) injection 80 mg (80 mg Intravenous Given 07/02/22 1254)  ipratropium-albuterol (DUONEB) 0.5-2.5 (3) MG/3ML nebulizer solution 3 mL (3 mLs Nebulization Given 07/02/22 1418)    ED Course/ Medical Decision Making/ A&P                             Medical Decision Making Amount and/or Complexity of Data Reviewed Labs: ordered. Radiology: ordered.  Risk Prescription drug management. Decision regarding hospitalization.   This patient is a 47 y.o. female who presents to  the ED for concern of chest pain and shortness of breath, this involves an extensive number of treatment options, and is a complaint that carries with it a high risk of complications and morbidity. The emergent differential diagnosis prior to evaluation includes, but is not limited to,  CHF, pericardial effusion/tamponade, arrhythmias, ACS, COPD, asthma, bronchitis, pneumonia, pneumothorax, PE, anemia. This is not an exhaustive differential.   Past Medical History / Co-morbidities / Social History: prior PE on Warfarin s/p IVC filter, iron deficiency anemia, chronic pain syndrome, HTN, CHF, (EF 30-35%, echo Feb 2024), severe mitral regurgitation, OSA on CPAP, CAD (nonobstructive, cath May 2023)  Additional history: Chart reviewed. Pertinent results include: Patient seen by myself and admitted in January 2024 with acute on chronic heart failure.  Most recent cardiology appointment on 4/30 patient reported that she was taking 40 mg Lasix twice daily instead of 80 mg twice daily.  Physical Exam: Physical exam performed. The pertinent findings include: Patient with increased respiratory effort, sitting upright in the exam bed.  Expiratory wheezing in all lung fields, with Rales in the bases, left greater than right.  Bilateral 2+ edema in the legs. SpO2 without very good waveform, intermittently dropping down in to the 80's, placed on 4L [place] for comfort.   Lab Tests: I ordered, and personally interpreted labs.  The pertinent results include: No leukocytosis, hemoglobin stable.  CMP unremarkable, normal kidney function.  On and 22, delta troponin 21.  BNP 1225.  PT 17, INR 1.4.  Negative pregnancy.   Imaging Studies: I ordered imaging studies including chest x-ray. I independently visualized and interpreted imaging which showed right upper lobe pneumonia, cardiomegaly, pulmonary vascular congestion, no effusion. I agree with the radiologist interpretation.   Cardiac Monitoring:  The patient was  maintained on a cardiac monitor.  My attending physician Dr. Criss Alvine viewed and interpreted the cardiac monitored which showed an underlying rhythm of: sinus rhythm, RVH. I agree with this interpretation.   Medications: I ordered medication including lasix, azithromycin, rocephin, duoneb  for volume overload and pneumonia. I have reviewed the patients home medicines and have made adjustments as needed.  Consultations Obtained: I requested consultation with the hospitalist Dr Natale Milch,  and discussed lab and imaging findings as well as pertinent plan - they recommend: medical admission for ongoing monitoring and diuresis   Disposition: After consideration of the diagnostic results and the patients response to treatment, I feel that patient would benefit from admission for observation and diuresis in the setting of volume overload and right upper lobe pneumonia with intermittent oxygen requirement.   I discussed this case with my attending physician Dr. Criss Alvine who cosigned this note including patient's presenting symptoms, physical exam, and planned diagnostics and interventions. Attending physician stated agreement with plan or made changes to plan which were implemented.    Final Clinical Impression(s) / ED Diagnoses Final diagnoses:  Pneumonia of right upper lobe due to infectious organism  Acute on chronic heart failure, unspecified heart failure type (HCC)  Hypervolemia, unspecified hypervolemia type    Rx / DC Orders ED Discharge Orders          Ordered    Amb Referral to HF Clinic       Comments: May opt for tele-visits if transport becomes an issue   07/02/22 1455           Portions of this report may have been transcribed using voice recognition software. Every effort was made to ensure accuracy; however, inadvertent computerized transcription errors may be present.    Jeanella Flattery 07/02/22 1458    Pricilla Loveless, MD 07/03/22 [phone]

## 2022-07-02 NOTE — Progress Notes (Signed)
ANTICOAGULATION CONSULT NOTE - Initial Consult  Pharmacy Consult for warfarin Indication:  hx PE  Allergies  Allergen Reactions   Sulfa Antibiotics Itching, Swelling and Rash    Lip swelling    Patient Measurements: Height: 5\' 10"  (177.8 cm) Weight: 112.5 kg (248 lb) IBW/kg (Calculated) : 68.5 Heparin Dosing Weight:   Vital Signs: Temp: 98.4 F (36.9 C) (06/11 1500) Temp Source: Oral (06/11 1500) BP: 130/90 (06/11 1500) Pulse Rate: 83 (06/11 1500)  Labs: Recent Labs    07/02/22 1100 07/02/22 1135 07/02/22 1254  HGB 11.2*  --   --   HCT 37.2  --   --   PLT 157  --   --   LABPROT 17.0*  --   --   INR 1.4*  --   --   CREATININE  --  0.81  --   TROPONINIHS  --  22* 21*    Estimated Creatinine Clearance: 119.2 mL/min (by C-G formula based on SCr of 0.81 mg/dL).   Medical History: Past Medical History:  Diagnosis Date   Anemia    Blood transfusion without reported diagnosis    CAD (coronary artery disease)    Nonobstructive   Closed left ankle fracture    August 30 2012   Clotting disorder [facility])    DVT (deep venous thrombosis) (HCC)    L leg   Fibroid tumor    HFrEF (heart failure with reduced ejection fraction) (HCC)    Hidradenitis    Hypertension    Mitral regurgitation    NICM (nonischemic cardiomyopathy) (HCC)    NSVT (nonsustained ventricular tachycardia) (HCC)    Obesity    OSA on CPAP    Pulmonary embolism (HCC) 04/2013   Sleep apnea     Medications:  (Not in a hospital admission)   Assessment: Pharmacy consulted to dose warfarin in patient with history of pulmonary embolism.  INR on admission is subtherapeutic at 1.4. Home dose listed as 5 mg on Sun + Thurs and 10 mg ROW with last dose 6/10.  Goal of Therapy:  INR 2-3 Monitor platelets by anticoagulation protocol: Yes   Plan:  Warfarin 10 mg x 1 dose. Monitor daily INR and s/s of bleeding.  Judeth Cornfield, PharmD Clinical Pharmacist 07/02/2022 3:08 PM

## 2022-07-03 ENCOUNTER — Inpatient Hospital Stay (HOSPITAL_COMMUNITY): Payer: Medicare HMO

## 2022-07-03 DIAGNOSIS — Z7901 Long term (current) use of anticoagulants: Secondary | ICD-10-CM | POA: Diagnosis not present

## 2022-07-03 DIAGNOSIS — I5043 Acute on chronic combined systolic (congestive) and diastolic (congestive) heart failure: Secondary | ICD-10-CM

## 2022-07-03 DIAGNOSIS — I5023 Acute on chronic systolic (congestive) heart failure: Secondary | ICD-10-CM

## 2022-07-03 LAB — BASIC METABOLIC PANEL
Anion gap: 7 (ref 5–15)
BUN: 15 mg/dL (ref 6–20)
CO2: 29 mmol/L (ref 22–32)
Calcium: 8.4 mg/dL — ABNORMAL LOW (ref 8.9–10.3)
Chloride: 101 mmol/L (ref 98–111)
Creatinine, Ser: 0.88 mg/dL (ref 0.44–1.00)
GFR, Estimated: >60 mL/min (ref >60)
Glucose, Bld: 85 mg/dL (ref 70–99)
Potassium: 4.6 mmol/L (ref 3.5–5.1)
Sodium: 137 mmol/L (ref 135–145)

## 2022-07-03 LAB — ECHOCARDIOGRAM COMPLETE
AR max vel: 1.56 cm2
AV Area VTI: 1.62 cm2
AV Area mean vel: 1.43 cm2
AV Mean grad: 2.9 mmHg
AV Peak grad: 5.1 mmHg
Ao pk vel: 1.13 m/s
Area-P 1/2: 5.38 cm2
Height: 71 in
MV M vel: 3.9 m/s
MV Peak grad: 60.7 mmHg
Radius: 1.2 cm
S' Lateral: 6.3 cm
Weight: 4578.51 oz

## 2022-07-03 LAB — PROTIME-INR
INR: 1.5 — ABNORMAL HIGH (ref 0.8–1.2)
Prothrombin Time: 18.4 seconds — ABNORMAL HIGH (ref 11.4–15.2)

## 2022-07-03 MED ORDER — WARFARIN SODIUM 5 MG PO TABS
10.0000 mg | ORAL_TABLET | Freq: Once | ORAL | Status: AC
  Administered 2022-07-03: 10 mg via ORAL
  Filled 2022-07-03: qty 2

## 2022-07-03 MED ORDER — PERFLUTREN LIPID MICROSPHERE
1.0000 mL | INTRAVENOUS | Status: AC | PRN
  Administered 2022-07-03: 6 mL via INTRAVENOUS

## 2022-07-03 NOTE — Progress Notes (Signed)
PROGRESS NOTE     [name] [name], is a 47 y.o. female, DOB - 08-23-1975, ZOX:[id number]  Admit date - 07/02/2022   Admitting Physician Azucena Fallen, MD  Outpatient Primary MD for the patient is Benetta Spar, MD  LOS - 1  Chief Complaint  Patient presents with   Chest Pain        Brief Narrative:  47 y.o. female with medical history significant of heart failure with reduced ejection fraction (last echo February 2024 EF 30 to 35%), nonsustained ventricular tachycardia, CAD, hypertension, history of PE/DVT, pulmonary hypertension, severe mitral regurgitation, obesity admitted on 07/02/2022 with acute on chronic combined diastolic and systolic CHF exacerbation    -Assessment and Plan: 1) acute on chronic combined systolic and diastolic dysfunction/exacerbation -Repeat echo from 07/03/2022 with EF down to 25-30% (Prior EF was 30 to 35% in February 2024) with regional wall motion abnormalities (The entire lateral wall, entire inferior wall, apical anterior segment  and apex are akinetic) , severely dilated left ventricular internal cavity size, moderate pulmonary hypertension -Echo also shows grade 3 diastolic dysfunction CHF and RV pressure overload -Moderate to severe MR -No aortic stenosis -Patient had right heart cath on 03/08/2022 recommended possible mitral valve replacement at the time -Will consult cardiology team -Continue IV Lasix 80 mg every 12, metoprolol , losartan and Aldactone -Daily weight fluid input and output monitoring weight and REDs Vest  2) history of DVT/PE--- continue chronic anticoagulation with Coumadin  3)Obesity- -Low calorie diet, portion control and increase physical activity discussed with patient -Body mass index is 39.91 kg/m.  4) valvular heart disease--moderate to severe MR--see #1 above  5) acute hypoxic respiratory failure--- due to 1 above -Regular requiring 3 to 4 L of oxygen via nasal cannula -Anticipate improvement with  diuresis  Status is: Inpatient   Disposition: The patient is from: Home              Anticipated d/c is to: Home              Anticipated d/c date is: 3 days              Patient currently is not medically stable to d/c. Barriers: Not Clinically Stable-   Code Status :  -  Code Status: Full Code   Family Communication:    NA (patient is alert, awake and coherent)   DVT Prophylaxis  :   - SCDs /Coumadin     Lab Results  Component Value Date   PLT 157 07/02/2022    Inpatient Medications  Scheduled Meds:  atorvastatin  20 mg Oral Daily   azithromycin  250 mg Oral Daily   furosemide  80 mg Intravenous Q12H   losartan  12.5 mg Oral Daily   metoprolol succinate  25 mg Oral Daily   pneumococcal 20-valent conjugate vaccine  0.5 mL Intramuscular Tomorrow-1000   potassium chloride SA  20 mEq Oral BID   sodium chloride flush  3 mL Intravenous Q12H   spironolactone  25 mg Oral Daily   Warfarin - Pharmacist Dosing Inpatient   Does not apply q1600   Continuous Infusions:  sodium chloride     PRN Meds:.sodium chloride, acetaminophen, ondansetron (ZOFRAN) IV, sodium chloride flush   Anti-infectives (From admission, onward)    Start     Dose/Rate Route Frequency Ordered Stop   07/03/22 1000  azithromycin (ZITHROMAX) tablet 250 mg        250 mg Oral Daily 07/02/22 1728 07/07/22 0959  07/02/22 1345  cefTRIAXone (ROCEPHIN) 1 g in sodium chloride 0.9 % 100 mL IVPB        1 g 200 mL/hr over 30 Minutes Intravenous  Once 07/02/22 1344 07/02/22 1523   07/02/22 1345  azithromycin (ZITHROMAX) 500 mg in sodium chloride 0.9 % 250 mL IVPB        500 mg 250 mL/hr over 60 Minutes Intravenous  Once 07/02/22 1344 07/02/22 1710         Subjective: [name] [name] today has no fevers, no emesis,  No chest pain,   -Dyspnea persist   Objective: Vitals:   07/03/22 0946 07/03/22 1128 07/03/22 1250 07/03/22 1754  BP: 99/66 104/78 100/67 (!) 99/59  Pulse: 67  62 62  Resp: 18 (!) 23 18 19    Temp: 97.8 F (36.6 C)  98.3 F (36.8 C) 98.3 F (36.8 C)  TempSrc: Oral  Oral Oral  SpO2: 100%  100% 100%  Weight:      Height:        Intake/Output Summary (Last 24 hours) at 07/03/2022 1838 Last data filed at 07/03/2022 1445 Gross per 24 hour  Intake 523 ml  Output 301 ml  Net 222 ml   Filed Weights   07/02/22 1016 07/02/22 1640 07/03/22 0325  Weight: 112.5 kg 130.8 kg 129.8 kg    Physical Exam  Gen:- Awake Alert, obese no conversational dyspnea but has dyspnea on exertion HEENT:- [place].AT, No sclera icterus Neck-Supple Neck, +ve JVD,.  Lungs-  CTAB , fair symmetrical air movement CV- S1, S2 normal, regular  Abd-  +ve B.Sounds, Abd Soft, No tenderness, increased truncal adiposity Extremity/Skin:- +ve  edema, pedal pulses present  Psych-affect is appropriate, oriented x3 Neuro-generalized weakness, no new focal deficits, no tremors  Data Reviewed: I have personally reviewed following labs and imaging studies  CBC: Recent Labs  Lab 07/02/22 1100  WBC 6.0  HGB 11.2*  HCT 37.2  MCV 82.5  PLT 157   Basic Metabolic Panel: Recent Labs  Lab 07/02/22 1135 07/03/22 0417  NA 133* 137  K 4.0 4.6  CL 101 101  CO2 25 29  GLUCOSE 106* 85  BUN 15 15  CREATININE 0.81 0.88  CALCIUM 8.3* 8.4*   GFR: Estimated Creatinine Clearance: 120.3 mL/min (by C-G formula based on SCr of 0.88 mg/dL). Liver Function Tests: Recent Labs  Lab 07/02/22 1135  AST 26  ALT 35  ALKPHOS 83  BILITOT 1.8*  PROT 7.3  ALBUMIN 3.0*   Cardiac Enzymes: No results for input(s): "CKTOTAL", "CKMB", "CKMBINDEX", "TROPONINI" in the last 168 hours. BNP (last 3 results) No results for input(s): "PROBNP" in the last 8760 hours. HbA1C: No results for input(s): "HGBA1C" in the last 72 hours. Sepsis Labs: @LABRCNTIP (procalcitonin:4,lacticidven:4) ) Recent Results (from the past 240 hour(s))  Resp panel by RT-PCR (RSV, Flu A&B, Covid) Anterior Nasal Swab     Status: None   Collection Time:  07/02/22  3:08 PM   Specimen: Anterior Nasal Swab  Result Value Ref Range Status   SARS Coronavirus 2 by RT PCR NEGATIVE NEGATIVE Final    Comment: (NOTE) SARS-CoV-2 target nucleic acids are NOT DETECTED.  The SARS-CoV-2 RNA is generally detectable in upper respiratory specimens during the acute phase of infection. The lowest concentration of SARS-CoV-2 viral copies this assay can detect is 138 copies/mL. A negative result does not preclude SARS-Cov-2 infection and should not be used as the sole basis for treatment or other patient management decisions. A negative result may occur with  improper specimen collection/handling, submission of specimen other than nasopharyngeal swab, presence of viral mutation(s) within the areas targeted by this assay, and inadequate number of viral copies(<138 copies/mL). A negative result must be combined with clinical observations, patient history, and epidemiological information. The expected result is Negative.  Fact Sheet for Patients:  BloggerCourse.com  Fact Sheet for Healthcare Providers:  SeriousBroker.it  This test is no t yet approved or cleared by the [place] FDA and  has been authorized for detection and/or diagnosis of SARS-CoV-2 by FDA under an Emergency Use Authorization (EUA). This EUA will remain  in effect (meaning this test can be used) for the duration of the COVID-19 declaration under Section 564(b)(1) of the Act, 21 U.S.C.section 360bbb-3(b)(1), unless the authorization is terminated  or revoked sooner.       Influenza A by PCR NEGATIVE NEGATIVE Final   Influenza B by PCR NEGATIVE NEGATIVE Final    Comment: (NOTE) The Xpert Xpress SARS-CoV-2/FLU/RSV plus assay is intended as an aid in the diagnosis of influenza from Nasopharyngeal swab specimens and should not be used as a sole basis for treatment. Nasal washings and aspirates are unacceptable for Xpert Xpress  SARS-CoV-2/FLU/RSV testing.  Fact Sheet for Patients: BloggerCourse.com  Fact Sheet for Healthcare Providers: SeriousBroker.it  This test is not yet approved or cleared by the [place] FDA and has been authorized for detection and/or diagnosis of SARS-CoV-2 by FDA under an Emergency Use Authorization (EUA). This EUA will remain in effect (meaning this test can be used) for the duration of the COVID-19 declaration under Section 564(b)(1) of the Act, 21 U.S.C. section 360bbb-3(b)(1), unless the authorization is terminated or revoked.     Resp Syncytial Virus by PCR NEGATIVE NEGATIVE Final    Comment: (NOTE) Fact Sheet for Patients: BloggerCourse.com  Fact Sheet for Healthcare Providers: SeriousBroker.it  This test is not yet approved or cleared by the [place] FDA and has been authorized for detection and/or diagnosis of SARS-CoV-2 by FDA under an Emergency Use Authorization (EUA). This EUA will remain in effect (meaning this test can be used) for the duration of the COVID-19 declaration under Section 564(b)(1) of the Act, 21 U.S.C. section 360bbb-3(b)(1), unless the authorization is terminated or revoked.  Performed at [facility], [address], [place], [place] [address]       Radiology Studies: ECHOCARDIOGRAM COMPLETE  Result Date: 07/03/2022    ECHOCARDIOGRAM REPORT   Patient Name:   [name] [name] Date of Exam: 07/03/2022 Medical Rec #:  [id number]       Height:       71.0 in Accession #:    [id number]      Weight:       286.2 lb Date of Birth:  December 08, 1975       BSA:          2.455 m Patient Age:    46 years        BP:           100/69 mmHg Patient Gender: F               HR:           59 bpm. [facility] Procedure: 2D Echo, Cardiac Doppler, Color Doppler and Intracardiac            Opacification Agent Indications:    Congestive Heart Failure I50.9   History:        Patient has prior history of Echocardiogram examinations, most  recent 03/14/2022. CHF and Cardiomyopathy, CAD, P.E., Mitral                 Valve Disease; Risk Factors:Current Smoker, Hypertension and                 Sleep Apnea.  Sonographer:    Aron Baba Referring Phys: [id number] Azucena Fallen  Sonographer Comments: Image acquisition challenging due to patient body habitus. IMPRESSIONS  1. Left ventricular ejection fraction, by estimation, is 25 to 30%. The left ventricle has severely decreased function. The left ventricle demonstrates regional wall motion abnormalities (see scoring diagram/findings for description). The left ventricular internal cavity size was severely dilated. Left ventricular diastolic parameters are consistent with Grade III diastolic dysfunction (restrictive). Elevated left atrial pressure.  2. Ventricular septum is flattened in systole suggesting RV pressure overload. . Right ventricular systolic function is mildly reduced. The right ventricular size is normal. There is moderately elevated pulmonary artery systolic pressure.  3. Left atrial size was severely dilated.  4. Functional eccentric posterior directed MR difficult to quantify, at least moderate possibly severe MR. MV/AV VTI ratio 1.3 suggesting moderate to severe MR. The mitral valve is abnormal. Moderate to severe mitral valve regurgitation. No evidence of mitral stenosis.  5. The tricuspid valve is abnormal. Tricuspid valve regurgitation is moderate to severe.  6. The aortic valve is tricuspid. There is mild calcification of the aortic valve. There is mild thickening of the aortic valve. Aortic valve regurgitation is not visualized. No aortic stenosis is present. FINDINGS  Left Ventricle: Left ventricular ejection fraction, by estimation, is 25 to 30%. The left ventricle has severely decreased function. The left ventricle demonstrates regional wall motion abnormalities. Definity contrast  agent was given IV to delineate the left ventricular endocardial borders. The left ventricular internal cavity size was severely dilated. There is no left ventricular hypertrophy. Left ventricular diastolic parameters are consistent with Grade III diastolic dysfunction (restrictive). Elevated left atrial pressure.  LV Wall Scoring: The entire lateral wall, entire inferior wall, apical anterior segment, and apex are akinetic. Right Ventricle: Ventricular septum is flattened in systole suggesting RV pressure overload. The right ventricular size is normal. Right vetricular wall thickness was not well visualized. Right ventricular systolic function is mildly reduced. There is moderately elevated pulmonary artery systolic pressure. The tricuspid regurgitant velocity is 3.00 m/s, and with an assumed right atrial pressure of 15 mmHg, the estimated right ventricular systolic pressure is 51.0 mmHg. Left Atrium: Left atrial size was severely dilated. Right Atrium: Right atrial size was normal in size. Pericardium: There is no evidence of pericardial effusion. Mitral Valve: Functional eccentric posterior directed MR difficult to quantify, at least moderate possibly severe MR. MV/AV VTI ratio 1.3 suggesting moderate to severe MR. The mitral valve is abnormal. Moderate to severe mitral valve regurgitation. No evidence of mitral valve stenosis. Tricuspid Valve: The tricuspid valve is abnormal. Tricuspid valve regurgitation is moderate to severe. No evidence of tricuspid stenosis. Aortic Valve: The aortic valve is tricuspid. There is mild calcification of the aortic valve. There is mild thickening of the aortic valve. There is mild aortic valve annular calcification. Aortic valve regurgitation is not visualized. No aortic stenosis  is present. Aortic valve mean gradient measures 2.9 mmHg. Aortic valve peak gradient measures 5.1 mmHg. Aortic valve area, by VTI measures 1.62 cm. Pulmonic Valve: The pulmonic valve was not well  visualized. Pulmonic valve regurgitation is mild. No evidence of pulmonic stenosis. Aorta: The aortic root and ascending aorta are structurally  normal, with no evidence of dilitation. IAS/Shunts: The interatrial septum was not well visualized.  LEFT VENTRICLE PLAX 2D LVIDd:         7.50 cm   Diastology LVIDs:         6.30 cm   LV e' medial:    5.61 cm/s LV PW:         0.90 cm   LV E/e' medial:  24.6 LV IVS:        0.70 cm   LV e' lateral:   6.83 cm/s LVOT diam:     1.90 cm   LV E/e' lateral: 20.2 LV SV:         35 LV SV Index:   14 LVOT Area:     2.84 cm  RIGHT VENTRICLE RV S prime:     7.71 cm/s TAPSE (M-mode): 1.3 cm LEFT ATRIUM            Index        RIGHT ATRIUM           Index LA diam:      6.00 cm  2.44 cm/m   RA Area:     25.90 cm LA Vol (A4C): 132.0 ml 53.76 ml/m  RA Volume:   80.50 ml  32.79 ml/m  AORTIC VALVE                    PULMONIC VALVE AV Area (Vmax):    1.56 cm     PR End Diast Vel: 9.18 msec AV Area (Vmean):   1.43 cm AV Area (VTI):     1.62 cm AV Vmax:           113.34 cm/s AV Vmean:          78.829 cm/s AV VTI:            0.219 m AV Peak Grad:      5.1 mmHg AV Mean Grad:      2.9 mmHg LVOT Vmax:         62.20 cm/s LVOT Vmean:        39.800 cm/s LVOT VTI:          0.125 m LVOT/AV VTI ratio: 0.57  AORTA Ao Root diam: 2.90 cm Ao Asc diam:  2.85 cm MITRAL VALVE                 TRICUSPID VALVE MV Area (PHT): 5.38 cm      TR Peak grad:   36.0 mmHg MV Decel Time: 141 msec      TR Vmax:        300.00 cm/s MR Peak grad:   60.7 mmHg MR Mean grad:   42.0 mmHg    SHUNTS MR Vmax:        389.50 cm/s  Systemic VTI:  0.12 m MR Vmean:       307.0 cm/s   Systemic Diam: 1.90 cm MR PISA:        9.05 cm MR PISA Radius: 1.20 cm MV E velocity: 138.00 cm/s MV A velocity: 31.00 cm/s MV E/A ratio:  4.45 Dina Rich MD Electronically signed by Dina Rich MD Signature Date/Time: 07/03/2022/10:14:34 AM    Final    DG Chest 2 View  Result Date: 07/02/2022 CLINICAL DATA:  Chest pain EXAM: CHEST - 2 VIEW  COMPARISON:  02/13/2022 FINDINGS: Cardiomegaly and pulmonary vascular congestion. Airspace consolidation within the inferior aspect of the right upper lobe abutting the horizontal fissure. Left lung is  clear. No pleural effusion or pneumothorax. IMPRESSION: 1. Right upper lobe pneumonia. 2. Cardiomegaly and pulmonary vascular congestion. Electronically Signed   By: Duanne Guess D.O.   On: 07/02/2022 12:56    Scheduled Meds:  atorvastatin  20 mg Oral Daily   azithromycin  250 mg Oral Daily   furosemide  80 mg Intravenous Q12H   losartan  12.5 mg Oral Daily   metoprolol succinate  25 mg Oral Daily   pneumococcal 20-valent conjugate vaccine  0.5 mL Intramuscular Tomorrow-1000   potassium chloride SA  20 mEq Oral BID   sodium chloride flush  3 mL Intravenous Q12H   spironolactone  25 mg Oral Daily   Warfarin - Pharmacist Dosing Inpatient   Does not apply q1600   Continuous Infusions:  sodium chloride      LOS: 1 day   Shon Hale M.D on 07/03/2022 at 6:38 PM  Go to [email or web address] - for contact info  [facility] - Office  [phone]  If 7PM-7AM, please contact night-coverage [email or web address] 07/03/2022, 6:38 PM

## 2022-07-03 NOTE — Progress Notes (Signed)
Received report from Costella Hatcher, Charity fundraiser.Patient alert,and oriented times four. No c/o pain or discomfort noted. Patient requested a diet soda, request provided. Plan of care on going.

## 2022-07-03 NOTE — Progress Notes (Signed)
ANTICOAGULATION CONSULT NOTE -   Pharmacy Consult for warfarin Indication:  hx PE  Allergies  Allergen Reactions   Sulfa Antibiotics Itching, Swelling and Rash    Lip swelling    Patient Measurements: Height: 5\' 11"  (180.3 cm) Weight: 129.8 kg (286 lb 2.5 oz) IBW/kg (Calculated) : 70.8  Vital Signs: Temp: 98 F (36.7 C) (06/12 0325) Temp Source: Oral (06/12 0325) BP: 100/69 (06/12 0325) Pulse Rate: 83 (06/12 0325)  Labs: Recent Labs    07/02/22 1100 07/02/22 1135 07/02/22 1254 07/03/22 0417  HGB 11.2*  --   --   --   HCT 37.2  --   --   --   PLT 157  --   --   --   LABPROT 17.0*  --   --  18.4*  INR 1.4*  --   --  1.5*  CREATININE  --  0.81  --  0.88  TROPONINIHS  --  22* 21*  --      Estimated Creatinine Clearance: 120.3 mL/min (by C-G formula based on SCr of 0.88 mg/dL).   Medical History: Past Medical History:  Diagnosis Date   Anemia    Blood transfusion without reported diagnosis    CAD (coronary artery disease)    Nonobstructive   Closed left ankle fracture    August 30 2012   Clotting disorder [facility])    DVT (deep venous thrombosis) (HCC)    L leg   Fibroid tumor    HFrEF (heart failure with reduced ejection fraction) (HCC)    Hidradenitis    Hypertension    Mitral regurgitation    NICM (nonischemic cardiomyopathy) (HCC)    NSVT (nonsustained ventricular tachycardia) (HCC)    Obesity    OSA on CPAP    Pulmonary embolism (HCC) 04/2013   Sleep apnea     Medications:  Medications Prior to Admission  Medication Sig Dispense Refill Last Dose   albuterol (VENTOLIN HFA) 108 (90 Base) MCG/ACT inhaler Inhale 2 puffs into the lungs every 2 (two) hours as needed for wheezing or shortness of breath. 18 g 11 Past Week   atorvastatin (LIPITOR) 20 MG tablet Take 1 tablet (20 mg total) by mouth daily. 90 tablet 3 07/01/2022   empagliflozin (JARDIANCE) 10 MG TABS tablet Take 1 tablet (10 mg total) by mouth daily before breakfast. 90 tablet 3 07/01/2022    furosemide (LASIX) 40 MG tablet Take 40 mg by mouth 2 (two) times daily.   07/01/2022   losartan (COZAAR) 25 MG tablet Take 0.5 tablets (12.5 mg total) by mouth daily. 90 tablet 3 07/01/2022   metoprolol succinate (TOPROL-XL) 25 MG 24 hr tablet Take 1 tablet (25 mg total) by mouth daily. Take with or immediately following a meal. 90 tablet 3 07/01/2022 at 0800   midodrine (PROAMATINE) 2.5 MG tablet Take 1 tablet (2.5 mg total) by mouth 2 (two) times daily with a meal. 60 tablet 6 07/01/2022   potassium chloride SA (KLOR-CON M) 20 MEQ tablet Take 20 mEq by mouth 2 (two) times daily.   07/01/2022   spironolactone (ALDACTONE) 25 MG tablet Take 1 tablet (25 mg total) by mouth daily. 90 tablet 3 07/01/2022   warfarin (COUMADIN) 10 MG tablet Take 1/2 tablet to 1 tablet daily or as directed by coumadin clinic. (Patient taking differently: Take 5-10 mg by mouth See admin instructions. 5mg  tues and thurs 10mg  all other days) 20 tablet 0 07/01/2022 at 2000    Assessment: Pharmacy consulted to dose warfarin in  patient with history of pulmonary embolism.  INR on admission is subtherapeutic at 1.4. Home dose listed as 5 mg on Sun + Thurs and 10 mg ROW.  INR 1.5, subtherapeutic  Goal of Therapy:  INR 2-3 Monitor platelets by anticoagulation protocol: Yes   Plan:  Warfarin 10 mg x 1 dose. Monitor daily INR and s/s of bleeding.  Elder Cyphers, BS Pharm D, BCPS Clinical Pharmacist 07/03/2022 7:53 AM

## 2022-07-03 NOTE — Progress Notes (Signed)
  Echocardiogram 2D Echocardiogram has been performed.  [name] [name] 07/03/2022, 9:46 AM

## 2022-07-03 NOTE — TOC Initial Note (Signed)
Transition of Care [facility]) - Initial/Assessment Note    Patient Details  Name: [name] [name] MRN: [id number] Date of Birth: May 09, 1975  Transition of Care [facility]) CM/SW Contact:    Elliot Gault, LCSW Phone Number: 07/03/2022, 12:13 PM  Clinical Narrative:                  Pt admitted from home. She has a high readmission risk score and TOC consult for HF screen.  Spoke with pt to assess. Per pt, she resides with her mother and with her daughter. She states she is independent in ADLs at home. Pt does not use any DME at home. She has a PCP and is able to get to appointments and obtain medications as needed.  Pt states she follows a heart healthy diet but does not weigh every day. Discussed referral to the [facility] program and pt is agreeable. Will make referral today.  Pt is not anticipating any other TOC needs for dc. Will assist if needs arise.  Expected Discharge Plan: Home/Self Care Barriers to Discharge: Continued Medical Work up   Patient Goals and CMS Choice Patient states their goals for this hospitalization and ongoing recovery are:: return home          Expected Discharge Plan and Services In-house Referral: Clinical Social Work     Living arrangements for the past 2 months: Single Family Home                                      Prior Living Arrangements/Services Living arrangements for the past 2 months: Single Family Home Lives with:: Minor Children, Parents Patient language and need for interpreter reviewed:: Yes Do you feel safe going back to the place where you live?: Yes      Need for Family Participation in Patient Care: No (Comment)     Criminal Activity/Legal Involvement Pertinent to Current Situation/Hospitalization: No - Comment as needed  Activities of Daily Living Home Assistive Devices/Equipment: None ADL Screening (condition at time of admission) Patient's cognitive ability adequate to safely complete daily activities?:  Yes Is the patient deaf or have difficulty hearing?: No Does the patient have difficulty seeing, even when wearing glasses/contacts?: No Does the patient have difficulty concentrating, remembering, or making decisions?: No Patient able to express need for assistance with ADLs?: Yes Does the patient have difficulty dressing or bathing?: No Independently performs ADLs?: Yes (appropriate for developmental age) Does the patient have difficulty walking or climbing stairs?: No Weakness of Legs: None Weakness of Arms/Hands: None  Permission Sought/Granted                  Emotional Assessment Appearance:: Appears stated age Attitude/Demeanor/Rapport: Engaged Affect (typically observed): Pleasant Orientation: : Oriented to Self, Oriented to Place, Oriented to  Time, Oriented to Situation Alcohol / Substance Use: Not Applicable Psych Involvement: No (comment)  Admission diagnosis:  Acute exacerbation of congestive heart failure (HCC) [I50.9] Acute on chronic heart failure, unspecified heart failure type (HCC) [I50.9] Hypervolemia, unspecified hypervolemia type [E87.70] Pneumonia of right upper lobe due to infectious organism [J18.9] Acute on chronic HFrEF (heart failure with reduced ejection fraction) (HCC) [I50.23] Patient Active Problem List   Diagnosis Date Noted   Acute exacerbation of congestive heart failure (HCC) 07/02/2022   Acute on chronic HFrEF (heart failure with reduced ejection fraction) (HCC) 02/13/2022   Breast swelling 01/24/2022   Cellulitis of breast 12/28/2021  Closed cervical spine fracture/C2 09/22/2021   Syncope and collapse 09/22/2021   Obesity (BMI 30-39.9) 09/22/2021   Pulmonary edema 06/14/2021   Dysphagia 06/14/2021   CAP (community acquired pneumonia) 06/05/2021   Other pancytopenia (HCC) 06/05/2021   Essential hypertension 06/05/2021   Hypokalemia 06/05/2021   Microcytic anemia    Lumbar spondylosis 12/14/2018   Lumbar foraminal stenosis  12/14/2018   Lumbar degenerative disc disease 12/14/2018   Chronic anticoagulation 03/24/2018   STD exposure 03/11/2018   History of miscarriage 12/01/2017   Left leg DVT (HCC) 12/01/2017   Menorrhagia 12/01/2017   S/P IVC filter 12/01/2017   Chronic bilateral low back pain with bilateral sciatica 12/01/2017   Chronic pain of left ankle 12/01/2017   Chronic pain syndrome 12/01/2017   Long term current use of opiate analgesic 12/01/2017   Encounter for therapeutic drug monitoring 09/03/2017   History of pulmonary embolism 08/27/2017   Abdominal cramps 08/30/2015   Obesity, Class III, BMI 40-49.9 (morbid obesity) (HCC) 08/18/2014   Uterine leiomyoma 08/18/2014   Benign essential hypertension 07/21/2014   Severe Iron deficiency anemia 02/05/2014   Back pain with left-sided radiculopathy 10/31/2013   Secondary pulmonary hypertension 07/06/2013   OSA on CPAP 07/06/2013   Pleuritic chest pain 04/27/2013   SOB (shortness of breath) 04/27/2013   Anemia 04/27/2013   Tobacco use disorder 04/27/2013   Saddle embolus of pulmonary artery with acute cor pulmonale (HCC) 04/27/2013   Bilateral pulmonary embolism (HCC) 04/27/2013   PCP:  Benetta Spar, MD Pharmacy:   [facility] [place], [place] - [address] [place] [address] Phone: [phone] Fax: [phone]     Social Determinants of Health (SDOH) Social History: SDOH Screenings   Food Insecurity: No Food Insecurity (07/02/2022)  Housing: Low Risk  (07/02/2022)  Transportation Needs: No Transportation Needs (07/02/2022)  Utilities: Not At Risk (07/02/2022)  Depression (PHQ2-9): Low Risk  (03/12/2022)  Tobacco Use: High Risk (07/02/2022)   SDOH Interventions:     Readmission Risk Interventions    07/03/2022   12:12 PM 02/13/2022   12:41 PM 12/31/2021    1:52 PM  Readmission Risk Prevention Plan  Transportation Screening Complete Complete   HRI or Home Care Consult Complete Complete  Complete  Social Work Consult for Recovery Care Planning/Counseling Complete Complete Complete  Palliative Care Screening Not Applicable Not Applicable Not Applicable  Medication Review [employer]) Complete Complete Complete

## 2022-07-04 ENCOUNTER — Encounter (HOSPITAL_COMMUNITY): Payer: Medicare HMO

## 2022-07-04 DIAGNOSIS — I340 Nonrheumatic mitral (valve) insufficiency: Secondary | ICD-10-CM | POA: Diagnosis not present

## 2022-07-04 DIAGNOSIS — I5023 Acute on chronic systolic (congestive) heart failure: Secondary | ICD-10-CM | POA: Diagnosis not present

## 2022-07-04 DIAGNOSIS — I5043 Acute on chronic combined systolic (congestive) and diastolic (congestive) heart failure: Secondary | ICD-10-CM | POA: Diagnosis not present

## 2022-07-04 LAB — BASIC METABOLIC PANEL
Anion gap: 8 (ref 5–15)
BUN: 19 mg/dL (ref 6–20)
CO2: 28 mmol/L (ref 22–32)
Calcium: 8.6 mg/dL — ABNORMAL LOW (ref 8.9–10.3)
Chloride: 99 mmol/L (ref 98–111)
Creatinine, Ser: 1.01 mg/dL — ABNORMAL HIGH (ref 0.44–1.00)
GFR, Estimated: >60 mL/min (ref >60)
Glucose, Bld: 82 mg/dL (ref 70–99)
Potassium: 4.8 mmol/L (ref 3.5–5.1)
Sodium: 135 mmol/L (ref 135–145)

## 2022-07-04 LAB — CBC
HCT: 36.6 % (ref 36.0–46.0)
Hemoglobin: 10.5 g/dL — ABNORMAL LOW (ref 12.0–15.0)
MCH: 24.4 pg — ABNORMAL LOW (ref 26.0–34.0)
MCHC: 28.7 g/dL — ABNORMAL LOW (ref 30.0–36.0)
MCV: 85.1 fL (ref 80.0–100.0)
Platelets: 196 10*3/uL (ref 150–400)
RBC: 4.3 MIL/uL (ref 3.87–5.11)
RDW: 21.9 % — ABNORMAL HIGH (ref 11.5–15.5)
WBC: 6.3 10*3/uL (ref 4.0–10.5)
nRBC: 0 % (ref 0.0–0.2)

## 2022-07-04 LAB — PROTIME-INR
INR: 1.4 — ABNORMAL HIGH (ref 0.8–1.2)
Prothrombin Time: 17.1 seconds — ABNORMAL HIGH (ref 11.4–15.2)

## 2022-07-04 MED ORDER — ENOXAPARIN SODIUM 150 MG/ML IJ SOSY
130.0000 mg | PREFILLED_SYRINGE | Freq: Two times a day (BID) | INTRAMUSCULAR | Status: AC
  Administered 2022-07-04 – 2022-07-06 (×5): 130 mg via SUBCUTANEOUS
  Filled 2022-07-04 (×9): qty 1

## 2022-07-04 MED ORDER — WARFARIN SODIUM 7.5 MG PO TABS
12.5000 mg | ORAL_TABLET | Freq: Once | ORAL | Status: AC
  Administered 2022-07-04: 12.5 mg via ORAL
  Filled 2022-07-04: qty 1

## 2022-07-04 MED ORDER — DM-GUAIFENESIN ER 30-600 MG PO TB12
1.0000 | ORAL_TABLET | Freq: Two times a day (BID) | ORAL | Status: DC
  Administered 2022-07-04 – 2022-07-09 (×11): 1 via ORAL
  Filled 2022-07-04 (×11): qty 1

## 2022-07-04 MED ORDER — BENZONATATE 100 MG PO CAPS
100.0000 mg | ORAL_CAPSULE | Freq: Three times a day (TID) | ORAL | Status: DC | PRN
  Administered 2022-07-04 – 2022-07-09 (×5): 100 mg via ORAL
  Filled 2022-07-04 (×5): qty 1

## 2022-07-04 NOTE — Progress Notes (Addendum)
ANTICOAGULATION CONSULT NOTE -   Pharmacy Consult for warfarin + coumadin bridge Indication:  hx PE  Allergies  Allergen Reactions   Sulfa Antibiotics Itching, Swelling and Rash    Lip swelling    Patient Measurements: Height: 5\' 11"  (180.3 cm) Weight: 128.4 kg (283 lb 1.1 oz) IBW/kg (Calculated) : 70.8  Vital Signs: Temp: 97.7 F (36.5 C) (06/13 0425) BP: 97/87 (06/13 0929) Pulse Rate: 63 (06/13 0425)  Labs: Recent Labs    07/02/22 1100 07/02/22 1135 07/02/22 1254 07/03/22 0417 07/04/22 0434  HGB 11.2*  --   --   --  10.5*  HCT 37.2  --   --   --  36.6  PLT 157  --   --   --  196  LABPROT 17.0*  --   --  18.4* 17.1*  INR 1.4*  --   --  1.5* 1.4*  CREATININE  --  0.81  --  0.88 1.01*  TROPONINIHS  --  22* 21*  --   --      Estimated Creatinine Clearance: 104.2 mL/min (A) (by C-G formula based on SCr of 1.01 mg/dL (H)).   Medical History: Past Medical History:  Diagnosis Date   Anemia    Blood transfusion without reported diagnosis    CAD (coronary artery disease)    Nonobstructive   Closed left ankle fracture    August 30 2012   Clotting disorder [facility])    DVT (deep venous thrombosis) (HCC)    L leg   Fibroid tumor    HFrEF (heart failure with reduced ejection fraction) (HCC)    Hidradenitis    Hypertension    Mitral regurgitation    NICM (nonischemic cardiomyopathy) (HCC)    NSVT (nonsustained ventricular tachycardia) (HCC)    Obesity    OSA on CPAP    Pulmonary embolism (HCC) 04/2013   Sleep apnea     Medications:  Medications Prior to Admission  Medication Sig Dispense Refill Last Dose   albuterol (VENTOLIN HFA) 108 (90 Base) MCG/ACT inhaler Inhale 2 puffs into the lungs every 2 (two) hours as needed for wheezing or shortness of breath. 18 g 11 Past Week   atorvastatin (LIPITOR) 20 MG tablet Take 1 tablet (20 mg total) by mouth daily. 90 tablet 3 07/01/2022   empagliflozin (JARDIANCE) 10 MG TABS tablet Take 1 tablet (10 mg total) by mouth  daily before breakfast. 90 tablet 3 07/01/2022   furosemide (LASIX) 40 MG tablet Take 40 mg by mouth 2 (two) times daily.   07/01/2022   losartan (COZAAR) 25 MG tablet Take 0.5 tablets (12.5 mg total) by mouth daily. 90 tablet 3 07/01/2022   metoprolol succinate (TOPROL-XL) 25 MG 24 hr tablet Take 1 tablet (25 mg total) by mouth daily. Take with or immediately following a meal. 90 tablet 3 07/01/2022 at 0800   midodrine (PROAMATINE) 2.5 MG tablet Take 1 tablet (2.5 mg total) by mouth 2 (two) times daily with a meal. 60 tablet 6 07/01/2022   potassium chloride SA (KLOR-CON M) 20 MEQ tablet Take 20 mEq by mouth 2 (two) times daily.   07/01/2022   spironolactone (ALDACTONE) 25 MG tablet Take 1 tablet (25 mg total) by mouth daily. 90 tablet 3 07/01/2022   warfarin (COUMADIN) 10 MG tablet Take 1/2 tablet to 1 tablet daily or as directed by coumadin clinic. (Patient taking differently: Take 5-10 mg by mouth See admin instructions. 5mg  tues and thurs 10mg  all other days) 20 tablet 0 07/01/2022 at  2000    Assessment: Pharmacy consulted to dose warfarin in patient with history of pulmonary embolism.  INR on admission is subtherapeutic at 1.4.  Home dose listed as 5 mg on Sun + Thurs and 10 mg ROW.  INR 1.5> 1.4, subtherapeutic, will give booster dose today Enoxaparin to bridge patient  Goal of Therapy:  INR 2-3 Monitor platelets by anticoagulation protocol: Yes   Plan:  Enoxaprin 1mg /kg (130) sq q12h Warfarin 12.5 mg x 1 dose. Monitor daily INR and s/s of bleeding.  Elder Cyphers, BS Pharm D, BCPS Clinical Pharmacist 07/04/2022 10:36 AM

## 2022-07-04 NOTE — Progress Notes (Signed)
PROGRESS NOTE     [name] [name], is a 47 y.o. female, DOB - Feb 03, 1975, ZOX:[id number]  Admit date - 07/02/2022   Admitting Physician Azucena Fallen, MD  Outpatient Primary MD for the patient is Benetta Spar, MD  LOS - 2  Chief Complaint  Patient presents with   Chest Pain        Brief Narrative:  47 y.o. female with medical history significant of heart failure with reduced ejection fraction (last echo February 2024 EF 30 to 35%), nonsustained ventricular tachycardia, CAD, hypertension, history of PE/DVT, pulmonary hypertension, severe mitral regurgitation, obesity admitted on 07/02/2022 with acute on chronic combined diastolic and systolic CHF exacerbation    -Assessment and Plan: 1) acute on chronic combined systolic and diastolic dysfunction/exacerbation -Repeat echo from 07/03/2022 with EF down to 25-30% (Prior EF was 30 to 35% in February 2024) with regional wall motion abnormalities (The entire lateral wall, entire inferior wall, apical anterior segment  and apex are akinetic) , severely dilated left ventricular internal cavity size, moderate pulmonary hypertension -Echo also shows grade 3 diastolic dysfunction CHF and RV pressure overload -Moderate to severe MR -No aortic stenosis -Patient had right heart cath on 03/08/2022 recommended possible mitral valve replacement at the time -Continue IV Lasix 80 mg every 12, metoprolol , losartan and Aldactone -Daily weight fluid input and output monitoring weight and REDs Vest -Cardiology consult appreciated -Soft BPs limiting titration of diuretics -May need to be transferred to [facility] for inotropes to support diuresis  2) history of DVT/PE--- continue chronic anticoagulation with Coumadin -Lovenox bridge  3)Obesity- -Low calorie diet, portion control and increase physical activity discussed with patient -Body mass index is 39.48 kg/m.  4) valvular heart disease--moderate to severe MR--see #1 above -Patient  is to follow-up as outpatient with structural heart team for possible mitral valve repair  5) acute hypoxic respiratory failure--- due to 1 above -Currently requiring 3 to 4 L of oxygen via nasal cannula -Anticipate improvement with diuresis  Status is: Inpatient   Disposition: The patient is from: Home              Anticipated d/c is to: Home              Anticipated d/c date is: 3 days              Patient currently is not medically stable to d/c. Barriers: Not Clinically Stable-   Code Status :  -  Code Status: Full Code   Family Communication:    NA (patient is alert, awake and coherent)  -Discussed with her boyfriend at bedside  DVT Prophylaxis  :   - SCDs /Coumadin     Lab Results  Component Value Date   PLT 196 07/04/2022    Inpatient Medications  Scheduled Meds:  atorvastatin  20 mg Oral Daily   azithromycin  250 mg Oral Daily   dextromethorphan-guaiFENesin  1 tablet Oral BID   enoxaparin (LOVENOX) injection  130 mg Subcutaneous Q12H   furosemide  80 mg Intravenous Q12H   losartan  12.5 mg Oral Daily   metoprolol succinate  25 mg Oral Daily   potassium chloride SA  20 mEq Oral BID   sodium chloride flush  3 mL Intravenous Q12H   spironolactone  25 mg Oral Daily   Warfarin - Pharmacist Dosing Inpatient   Does not apply q1600   Continuous Infusions:  sodium chloride     PRN Meds:.sodium chloride, acetaminophen, benzonatate, ondansetron (ZOFRAN) IV,  sodium chloride flush   Anti-infectives (From admission, onward)    Start     Dose/Rate Route Frequency Ordered Stop   07/03/22 1000  azithromycin (ZITHROMAX) tablet 250 mg        250 mg Oral Daily 07/02/22 1728 07/07/22 0959   07/02/22 1345  cefTRIAXone (ROCEPHIN) 1 g in sodium chloride 0.9 % 100 mL IVPB        1 g 200 mL/hr over 30 Minutes Intravenous  Once 07/02/22 1344 07/02/22 1523   07/02/22 1345  azithromycin (ZITHROMAX) 500 mg in sodium chloride 0.9 % 250 mL IVPB        500 mg 250 mL/hr over 60  Minutes Intravenous  Once 07/02/22 1344 07/02/22 1710         Subjective: [name] today has no fevers, no emesis,  No chest pain,   -Dyspnea persist -Voiding well -Boyfriend at bedside, questions answered -Hypoxia persist  Objective: Vitals:   07/04/22 0425 07/04/22 0500 07/04/22 0929 07/04/22 1405  BP: 106/80  97/87 119/79  Pulse: 63   70  Resp: 18     Temp: 97.7 F (36.5 C)   98 F (36.7 C)  TempSrc:    Oral  SpO2: 100%   100%  Weight:  128.4 kg    Height:        Intake/Output Summary (Last 24 hours) at 07/04/2022 1901 Last data filed at 07/04/2022 1700 Gross per 24 hour  Intake 1200 ml  Output 1900 ml  Net -700 ml   Filed Weights   07/02/22 1640 07/03/22 0325 07/04/22 0500  Weight: 130.8 kg 129.8 kg 128.4 kg    Physical Exam  Gen:- Awake Alert, obese no conversational dyspnea but has dyspnea on exertion HEENT:- [place].AT, No sclera icterus Nose- Miranda 3L/min Neck-Supple Neck, +ve JVD,.  Lungs-breath sounds, faint bibasilar rales  CV- S1, S2 normal, regular  Abd-  +ve B.Sounds, Abd Soft, No tenderness, increased truncal adiposity Extremity/Skin:- +ve  edema, pedal pulses present  Psych-affect is appropriate, oriented x3 Neuro-generalized weakness, no new focal deficits, no tremors  Data Reviewed: I have personally reviewed following labs and imaging studies  CBC: Recent Labs  Lab 07/02/22 1100 07/04/22 0434  WBC 6.0 6.3  HGB 11.2* 10.5*  HCT 37.2 36.6  MCV 82.5 85.1  PLT 157 196   Basic Metabolic Panel: Recent Labs  Lab 07/02/22 1135 07/03/22 0417 07/04/22 0434  NA 133* 137 135  K 4.0 4.6 4.8  CL 101 101 99  CO2 25 29 28   GLUCOSE 106* 85 82  BUN 15 15 19   CREATININE 0.81 0.88 1.01*  CALCIUM 8.3* 8.4* 8.6*   GFR: Estimated Creatinine Clearance: 104.2 mL/min (A) (by C-G formula based on SCr of 1.01 mg/dL (H)). Liver Function Tests: Recent Labs  Lab 07/02/22 1135  AST 26  ALT 35  ALKPHOS 83  BILITOT 1.8*  PROT 7.3  ALBUMIN 3.0*    Recent Results (from the past 240 hour(s))  Resp panel by RT-PCR (RSV, Flu A&B, Covid) Anterior Nasal Swab     Status: None   Collection Time: 07/02/22  3:08 PM   Specimen: Anterior Nasal Swab  Result Value Ref Range Status   SARS Coronavirus 2 by RT PCR NEGATIVE NEGATIVE Final    Comment: (NOTE) SARS-CoV-2 target nucleic acids are NOT DETECTED.  The SARS-CoV-2 RNA is generally detectable in upper respiratory specimens during the acute phase of infection. The lowest concentration of SARS-CoV-2 viral copies this assay can detect is 138 copies/mL. A  negative result does not preclude SARS-Cov-2 infection and should not be used as the sole basis for treatment or other patient management decisions. A negative result may occur with  improper specimen collection/handling, submission of specimen other than nasopharyngeal swab, presence of viral mutation(s) within the areas targeted by this assay, and inadequate number of viral copies(<138 copies/mL). A negative result must be combined with clinical observations, patient history, and epidemiological information. The expected result is Negative.  Fact Sheet for Patients:  BloggerCourse.com  Fact Sheet for Healthcare Providers:  SeriousBroker.it  This test is no t yet approved or cleared by the [place] FDA and  has been authorized for detection and/or diagnosis of SARS-CoV-2 by FDA under an Emergency Use Authorization (EUA). This EUA will remain  in effect (meaning this test can be used) for the duration of the COVID-19 declaration under Section 564(b)(1) of the Act, 21 U.S.C.section 360bbb-3(b)(1), unless the authorization is terminated  or revoked sooner.       Influenza A by PCR NEGATIVE NEGATIVE Final   Influenza B by PCR NEGATIVE NEGATIVE Final    Comment: (NOTE) The Xpert Xpress SARS-CoV-2/FLU/RSV plus assay is intended as an aid in the diagnosis of influenza from  Nasopharyngeal swab specimens and should not be used as a sole basis for treatment. Nasal washings and aspirates are unacceptable for Xpert Xpress SARS-CoV-2/FLU/RSV testing.  Fact Sheet for Patients: BloggerCourse.com  Fact Sheet for Healthcare Providers: SeriousBroker.it  This test is not yet approved or cleared by the [place] FDA and has been authorized for detection and/or diagnosis of SARS-CoV-2 by FDA under an Emergency Use Authorization (EUA). This EUA will remain in effect (meaning this test can be used) for the duration of the COVID-19 declaration under Section 564(b)(1) of the Act, 21 U.S.C. section 360bbb-3(b)(1), unless the authorization is terminated or revoked.     Resp Syncytial Virus by PCR NEGATIVE NEGATIVE Final    Comment: (NOTE) Fact Sheet for Patients: BloggerCourse.com  Fact Sheet for Healthcare Providers: SeriousBroker.it  This test is not yet approved or cleared by the [place] FDA and has been authorized for detection and/or diagnosis of SARS-CoV-2 by FDA under an Emergency Use Authorization (EUA). This EUA will remain in effect (meaning this test can be used) for the duration of the COVID-19 declaration under Section 564(b)(1) of the Act, 21 U.S.C. section 360bbb-3(b)(1), unless the authorization is terminated or revoked.  Performed at [facility], [address] [place], [place] [address]       Radiology Studies: ECHOCARDIOGRAM COMPLETE  Result Date: 07/03/2022    ECHOCARDIOGRAM REPORT   Patient Name:   MAMMIE PAULS Date of Exam: 07/03/2022 Medical Rec #:  [id number]       Height:       71.0 in Accession #:    [id number]      Weight:       286.2 lb Date of Birth:  1975-01-26       BSA:          2.455 m Patient Age:    46 years        BP:           100/69 mmHg Patient Gender: F               HR:           59 bpm. [facility] Procedure: 2D Echo, Cardiac Doppler, Color Doppler and Intracardiac  Opacification Agent Indications:    Congestive Heart Failure I50.9  History:        Patient has prior history of Echocardiogram examinations, most                 recent 03/14/2022. CHF and Cardiomyopathy, CAD, P.E., Mitral                 Valve Disease; Risk Factors:Current Smoker, Hypertension and                 Sleep Apnea.  Sonographer:    Aron Baba Referring Phys: [id number] Azucena Fallen  Sonographer Comments: Image acquisition challenging due to patient body habitus. IMPRESSIONS  1. Left ventricular ejection fraction, by estimation, is 25 to 30%. The left ventricle has severely decreased function. The left ventricle demonstrates regional wall motion abnormalities (see scoring diagram/findings for description). The left ventricular internal cavity size was severely dilated. Left ventricular diastolic parameters are consistent with Grade III diastolic dysfunction (restrictive). Elevated left atrial pressure.  2. Ventricular septum is flattened in systole suggesting RV pressure overload. . Right ventricular systolic function is mildly reduced. The right ventricular size is normal. There is moderately elevated pulmonary artery systolic pressure.  3. Left atrial size was severely dilated.  4. Functional eccentric posterior directed MR difficult to quantify, at least moderate possibly severe MR. MV/AV VTI ratio 1.3 suggesting moderate to severe MR. The mitral valve is abnormal. Moderate to severe mitral valve regurgitation. No evidence of mitral stenosis.  5. The tricuspid valve is abnormal. Tricuspid valve regurgitation is moderate to severe.  6. The aortic valve is tricuspid. There is mild calcification of the aortic valve. There is mild thickening of the aortic valve. Aortic valve regurgitation is not visualized. No aortic stenosis is present. FINDINGS  Left Ventricle: Left ventricular ejection fraction, by estimation, is  25 to 30%. The left ventricle has severely decreased function. The left ventricle demonstrates regional wall motion abnormalities. Definity contrast agent was given IV to delineate the left ventricular endocardial borders. The left ventricular internal cavity size was severely dilated. There is no left ventricular hypertrophy. Left ventricular diastolic parameters are consistent with Grade III diastolic dysfunction (restrictive). Elevated left atrial pressure.  LV Wall Scoring: The entire lateral wall, entire inferior wall, apical anterior segment, and apex are akinetic. Right Ventricle: Ventricular septum is flattened in systole suggesting RV pressure overload. The right ventricular size is normal. Right vetricular wall thickness was not well visualized. Right ventricular systolic function is mildly reduced. There is moderately elevated pulmonary artery systolic pressure. The tricuspid regurgitant velocity is 3.00 m/s, and with an assumed right atrial pressure of 15 mmHg, the estimated right ventricular systolic pressure is 51.0 mmHg. Left Atrium: Left atrial size was severely dilated. Right Atrium: Right atrial size was normal in size. Pericardium: There is no evidence of pericardial effusion. Mitral Valve: Functional eccentric posterior directed MR difficult to quantify, at least moderate possibly severe MR. MV/AV VTI ratio 1.3 suggesting moderate to severe MR. The mitral valve is abnormal. Moderate to severe mitral valve regurgitation. No evidence of mitral valve stenosis. Tricuspid Valve: The tricuspid valve is abnormal. Tricuspid valve regurgitation is moderate to severe. No evidence of tricuspid stenosis. Aortic Valve: The aortic valve is tricuspid. There is mild calcification of the aortic valve. There is mild thickening of the aortic valve. There is mild aortic valve annular calcification. Aortic valve regurgitation is not visualized. No aortic stenosis  is present. Aortic valve mean gradient measures 2.9  mmHg.  Aortic valve peak gradient measures 5.1 mmHg. Aortic valve area, by VTI measures 1.62 cm. Pulmonic Valve: The pulmonic valve was not well visualized. Pulmonic valve regurgitation is mild. No evidence of pulmonic stenosis. Aorta: The aortic root and ascending aorta are structurally normal, with no evidence of dilitation. IAS/Shunts: The interatrial septum was not well visualized.  LEFT VENTRICLE PLAX 2D LVIDd:         7.50 cm   Diastology LVIDs:         6.30 cm   LV e' medial:    5.61 cm/s LV PW:         0.90 cm   LV E/e' medial:  24.6 LV IVS:        0.70 cm   LV e' lateral:   6.83 cm/s LVOT diam:     1.90 cm   LV E/e' lateral: 20.2 LV SV:         35 LV SV Index:   14 LVOT Area:     2.84 cm  RIGHT VENTRICLE RV S prime:     7.71 cm/s TAPSE (M-mode): 1.3 cm LEFT ATRIUM            Index        RIGHT ATRIUM           Index LA diam:      6.00 cm  2.44 cm/m   RA Area:     25.90 cm LA Vol (A4C): 132.0 ml 53.76 ml/m  RA Volume:   80.50 ml  32.79 ml/m  AORTIC VALVE                    PULMONIC VALVE AV Area (Vmax):    1.56 cm     PR End Diast Vel: 9.18 msec AV Area (Vmean):   1.43 cm AV Area (VTI):     1.62 cm AV Vmax:           113.34 cm/s AV Vmean:          78.829 cm/s AV VTI:            0.219 m AV Peak Grad:      5.1 mmHg AV Mean Grad:      2.9 mmHg LVOT Vmax:         62.20 cm/s LVOT Vmean:        39.800 cm/s LVOT VTI:          0.125 m LVOT/AV VTI ratio: 0.57  AORTA Ao Root diam: 2.90 cm Ao Asc diam:  2.85 cm MITRAL VALVE                 TRICUSPID VALVE MV Area (PHT): 5.38 cm      TR Peak grad:   36.0 mmHg MV Decel Time: 141 msec      TR Vmax:        300.00 cm/s MR Peak grad:   60.7 mmHg MR Mean grad:   42.0 mmHg    SHUNTS MR Vmax:        389.50 cm/s  Systemic VTI:  0.12 m MR Vmean:       307.0 cm/s   Systemic Diam: 1.90 cm MR PISA:        9.05 cm MR PISA Radius: 1.20 cm MV E velocity: 138.00 cm/s MV A velocity: 31.00 cm/s MV E/A ratio:  4.45 Dina Rich MD Electronically signed by Dina Rich MD  Signature Date/Time: 07/03/2022/10:14:34 AM    Final     Scheduled  Meds:  atorvastatin  20 mg Oral Daily   azithromycin  250 mg Oral Daily   dextromethorphan-guaiFENesin  1 tablet Oral BID   enoxaparin (LOVENOX) injection  130 mg Subcutaneous Q12H   furosemide  80 mg Intravenous Q12H   losartan  12.5 mg Oral Daily   metoprolol succinate  25 mg Oral Daily   potassium chloride SA  20 mEq Oral BID   sodium chloride flush  3 mL Intravenous Q12H   spironolactone  25 mg Oral Daily   Warfarin - Pharmacist Dosing Inpatient   Does not apply q1600   Continuous Infusions:  sodium chloride      LOS: 2 days   Shon Hale M.D on 07/04/2022 at 7:01 PM  Go to [email or web address] - for contact info  [facility] - Office  [phone]  If 7PM-7AM, please contact night-coverage [email or web address] 07/04/2022, 7:01 PM

## 2022-07-04 NOTE — Consult Note (Addendum)
Cardiology Consultation   Patient ID: [name] [name] MRN: [id number]; DOB: 1975-07-30  Admit date: 07/02/2022 Date of Consult: 07/04/2022  PCP:  Benetta Spar, MD   [facility] Providers Cardiologist:  Dina Rich, MD  Electrophysiologist:  Maurice Small, MD Advanced Heart Failure: Dr. Gasper Lloyd  Patient Profile:   [name] [name] is a 47 y.o. female with a hx of HFrEF (EF 45-50% in 05/2021 with cath showing 40-50% proximal RCA stenosis but otherwise normal cors, EF at 30-35% in 08/2021 and 09/2021, 35-40% in 01/2022 and 30 to 35% in 02/2022), moderate to severe MR (severe by TEE in 02/2022), history of recurrent PE/DVT, HTN, NSVT and OSA (on CPAP) who is being seen 07/04/2022 for the evaluation of CHF at the request of Dr. Mariea Clonts.  History of Present Illness:   Ms. Puma has been followed by Advanced Heart Failure and underwent RHC in 02/2022 which showed moderately reduced cardiac index and elevated filling pressures. Recent TEE was consistent with severe MR. She was felt to be volume overloaded on examination and was taking Lasix 80 mg in AM/40 mg in PM and was continued on this with plans to return to 40 mg twice daily afterwards. Was continued on Losartan 12.5 mg daily, Toprol-XL 25 mg daily and Spironolactone 25 mg daily with plans to start Jardiance 10 mg daily.  She did see Sharlene Dory, NP in 04/2022 and weight had trended up to 253 lbs (previously 242 lbs in 03/2022). She was encouraged to take an additional 40 mg of Lasix daily for 3 days then resume 40 mg twice daily. She did have 2 consults with the Structural Heart Team in the interim and no showed for both of these.  She most recently presented to [facility] ED on 07/02/2022 for evaluation of chest pain and shortness of breath starting earlier in the day. In talking with the patient today, she reports having worsening dyspnea on exertion along with orthopnea, PND and lower extremity edema for the  past 3 to 4 weeks. Says that her weight had increased into the 270's on her home scales. Reports some discomfort along the right side of her chest with coughing but no exertional chest pain. No recent palpitations. Says that she missed her appointment with Structural Heart as her 71-year-old daughter was sick.  Initial labs showed WBC 6.0, Hgb 11.2, platelets 157, Na+ 133, K+ 4.0 and creatinine 0.81. AST 26 and ALT 35. Troponin I values mildly elevated at 22 and 21. BNP at 1225. INR subtherapeutic at 1.4. CXR showed possible right upper lobe pneumonia and cardiomegaly and pulmonary vascular congestion. EKG showed normal sinus rhythm with a heart rate 82 with LAD and IVCD.  She was admitted for further management of acute CHF exacerbation and has been started on IV Lasix 80 mg twice daily along with being continued on Losartan 12.5 mg daily, Toprol-XL 25 mg daily and Spironolactone 25 mg daily.  Was also started on antibiotic therapy for possible pneumonia. Repeat echocardiogram this admission shows her EF has again declined to 25 to 30% and the entire lateral wall, entire inferior wall and apical anterior segment and apex being akinetic. RV function mildly reduced and PASP moderately elevated. MR again noted with functional eccentric posterior directed MR and at least moderate to severe. Also noted to have moderate to severe TR.  She has a recorded net output of -413 mL. Weights listed as declining from 288 lbs to 283 lbs this admission (previously 253 lbs in  04/2022).   Past Medical History:  Diagnosis Date   Anemia    Blood transfusion without reported diagnosis    CAD (coronary artery disease)    Nonobstructive   Closed left ankle fracture    August 30 2012   Clotting disorder [facility])    DVT (deep venous thrombosis) (HCC)    L leg   Fibroid tumor    HFrEF (heart failure with reduced ejection fraction) (HCC)    Hidradenitis    Hypertension    Mitral regurgitation    NICM (nonischemic  cardiomyopathy) (HCC)    NSVT (nonsustained ventricular tachycardia) (HCC)    Obesity    OSA on CPAP    Pulmonary embolism (HCC) 04/2013   Sleep apnea     Past Surgical History:  Procedure Laterality Date   ABSCESS DRAINAGE Bilateral 01/10/2015   BREAST BIOPSY Right 02/01/2020   benign   COLONOSCOPY WITH PROPOFOL N/A 06/08/2018   Procedure: COLONOSCOPY WITH PROPOFOL;  Surgeon: Corbin Ade, MD;  Location: [facility] ENDO [facility];  Service: Endoscopy;  Laterality: N/A;  2:30pm   CYST REMOVAL TRUNK     IVC FILTER PLACEMENT ([facility] HX)     LOWER EXTREMITY VENOGRAPHY N/A 02/07/2021   Procedure: LOWER EXTREMITY VENOGRAPHY;  Surgeon: Victorino Sparrow, MD;  Location: [facility] INVASIVE CV LAB;  Service: Cardiovascular;  Laterality: N/A;   PERIPHERAL VASCULAR THROMBECTOMY N/A 02/07/2021   Procedure: PERIPHERAL VASCULAR THROMBECTOMY;  Surgeon: Victorino Sparrow, MD;  Location: [facility] INVASIVE CV LAB;  Service: Cardiovascular;  Laterality: N/A;   RIGHT HEART CATH N/A 03/08/2022   Procedure: RIGHT HEART CATH;  Surgeon: Dorthula Nettles, DO;  Location: [facility] INVASIVE CV LAB;  Service: Cardiovascular;  Laterality: N/A;   RIGHT/LEFT HEART CATH AND CORONARY ANGIOGRAPHY N/A 06/19/2021   Procedure: RIGHT/LEFT HEART CATH AND CORONARY ANGIOGRAPHY;  Surgeon: Lyn Records, MD;  Location: [facility] INVASIVE CV LAB;  Service: Cardiovascular;  Laterality: N/A;   TEE WITHOUT CARDIOVERSION N/A 03/08/2022   Procedure: TRANSESOPHAGEAL ECHOCARDIOGRAM (TEE);  Surgeon: Dorthula Nettles, DO;  Location: [facility] ENDOSCOPY;  Service: Cardiovascular;  Laterality: N/A;     Home Medications:  Prior to Admission medications   Medication Sig Start Date End Date Taking? Authorizing Provider  albuterol (VENTOLIN HFA) 108 (90 Base) MCG/ACT inhaler Inhale 2 puffs into the lungs every 2 (two) hours as needed for wheezing or shortness of breath. 09/13/21  Yes Oretha Milch, MD  atorvastatin (LIPITOR) 20 MG tablet Take 1 tablet (20 mg total) by mouth daily.  09/10/21  Yes Aydenn Gervin, Dorothe Pea, MD  empagliflozin (JARDIANCE) 10 MG TABS tablet Take 1 tablet (10 mg total) by mouth daily before breakfast. 01/30/22  Yes [name], [name], DO  furosemide (LASIX) 40 MG tablet Take 40 mg by mouth 2 (two) times daily.   Yes [provider]  losartan (COZAAR) 25 MG tablet Take 0.5 tablets (12.5 mg total) by mouth daily. 01/11/22 01/06/23 Yes Erick Blinks, MD  metoprolol succinate (TOPROL-XL) 25 MG 24 hr tablet Take 1 tablet (25 mg total) by mouth daily. Take with or immediately following a meal. 01/11/22 01/06/23 Yes Memon, Durward Mallard, MD  midodrine (PROAMATINE) 2.5 MG tablet Take 1 tablet (2.5 mg total) by mouth 2 (two) times daily with a meal. 03/01/22  Yes Sharlene Dory, NP  potassium chloride SA (KLOR-CON M) 20 MEQ tablet Take 20 mEq by mouth 2 (two) times daily. 03/13/22  Yes [provider]  spironolactone (ALDACTONE) 25 MG tablet Take 1 tablet (25 mg total) by mouth daily. 03/11/22  Yes [name], [name], DO  warfarin (COUMADIN) 10 MG tablet Take 1/2 tablet to 1 tablet daily or as directed by coumadin clinic. Patient taking differently: Take 5-10 mg by mouth See admin instructions. 5mg  tues and thurs 10mg  all other days 03/15/22  Yes [name], [name], DO    Inpatient Medications: Scheduled Meds:  atorvastatin  20 mg Oral Daily   azithromycin  250 mg Oral Daily   dextromethorphan-guaiFENesin  1 tablet Oral BID   furosemide  80 mg Intravenous Q12H   losartan  12.5 mg Oral Daily   metoprolol succinate  25 mg Oral Daily   pneumococcal 20-valent conjugate vaccine  0.5 mL Intramuscular Tomorrow-1000   potassium chloride SA  20 mEq Oral BID   sodium chloride flush  3 mL Intravenous Q12H   spironolactone  25 mg Oral Daily   Warfarin - Pharmacist Dosing Inpatient   Does not apply q1600   Continuous Infusions:  sodium chloride     PRN Meds: sodium chloride, acetaminophen, ondansetron (ZOFRAN) IV, sodium chloride flush  Allergies:     Allergies  Allergen Reactions   Sulfa Antibiotics Itching, Swelling and Rash    Lip swelling    Social History:   Social History   Socioeconomic History   Marital status: Widowed    Spouse name: Not on file   Number of children: Not on file   Years of education: Not on file   Highest education level: Not on file  Occupational History   Not on file  Tobacco Use   Smoking status: Every Day    Years: 18    Types: Cigarettes    Passive exposure: Never   Smokeless tobacco: Never   Tobacco comments:    Smokes 2-3 cigarettes daily as of 02/13/22  Vaping Use   Vaping Use: Never used  Substance and Sexual Activity   Alcohol use: Yes    Alcohol/week: 2.0 standard drinks of alcohol    Types: 2 Cans of beer per week    Comment: twice a month   Drug use: No   Sexual activity: Not Currently    Birth control/protection: Pill, None  Other Topics Concern   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    Has a 46 year old Aviauna.   Live with parents.   Was working 5 days a weeks.   Not working right now.    Attends church.    Social Determinants of Health   Financial Resource Strain: Not on file  Food Insecurity: No Food Insecurity (07/02/2022)   Hunger Vital Sign    Worried About Running Out of Food in the Last Year: Never true    Ran Out of Food in the Last Year: Never true  Transportation Needs: No Transportation Needs (07/02/2022)   PRAPARE - Administrator, Civil Service (Medical): No    Lack of Transportation (Non-Medical): No  Physical Activity: Not on file  Stress: Not on file  Social Connections: Not on file  Intimate Partner Violence: Not At Risk (07/02/2022)   Humiliation, Afraid, Rape, and Kick questionnaire    Fear of Current or Ex-Partner: No    Emotionally Abused: No    Physically Abused: No    Sexually Abused: No    Family History:    Family History  Problem Relation Age of Onset    Hypertension Mother    Diabetes Paternal Aunt    Diabetes Paternal  Uncle    Other Father        blood clots   Cancer Maternal Grandmother    Heart attack Paternal Grandmother    Colon cancer Neg Hx      ROS:  Please see the history of present illness.   All other ROS reviewed and negative.     Physical Exam/Data:   Vitals:   07/04/22 0016 07/04/22 0425 07/04/22 0500 07/04/22 0929  BP: 105/67 106/80  97/87  Pulse: 63 63    Resp: 18 18    Temp: (!) 97.3 F (36.3 C) 97.7 F (36.5 C)    TempSrc:      SpO2: 100% 100%    Weight:   128.4 kg   Height:        Intake/Output Summary (Last 24 hours) at 07/04/2022 0933 Last data filed at 07/04/2022 0618 Gross per 24 hour  Intake 1340 ml  Output 2100 ml  Net -760 ml      07/04/2022    5:00 AM 07/03/2022    3:25 AM 07/02/2022    4:40 PM  Last 3 Weights  Weight (lbs) 283 lb 1.1 oz 286 lb 2.5 oz 288 lb 5.8 oz  Weight (kg) 128.4 kg 129.8 kg 130.8 kg     Body mass index is 39.48 kg/m.  General: Obese female appearing in no acute distress. HEENT: normal Neck: JVD appears elevated but difficult to assess due to body habitus.  Vascular: No carotid bruits; Distal pulses 2+ bilaterally Cardiac:  normal S1, S2; RRR; 3/6 systolic murmur along Apex and radiates to sternal border.  Lungs: rales along bases bilaterally.  Abd: soft, nontender, no hepatomegaly  Ext: 1+ pitting edema bilaterally.  Musculoskeletal:  No deformities, BUE and BLE strength normal and equal Skin: warm and dry  Neuro:  CNs 2-12 intact, no focal abnormalities noted Psych:  Normal affect   EKG:  The EKG was personally reviewed and demonstrates: NSR, HR 82 with LAD and IVCD. Telemetry:  Telemetry was personally reviewed and demonstrates: Sinus bradycardia, HR in 50's to 60's with occasional PVC's.   Relevant CV Studies:  RHC: 02/2022 HEMODYNAMICS: RA:                  15 mmHg (mean) RV:                  72/7-17 mmHg PA:                  74/28 mmHg (43  mean) PCWP:            27 mmHg at the Left PA; unable to wedge right PA despite multiple attempts; V waves on right PA.                                       Estimated Fick CO/CI   5.2 L/min, 2.15 L/min/m2                                            TPG                 16  mmHg  PVR                 3.1 Wood Units  PAPi                3       IMPRESSION: Severely elevated pre and post capillary filling pressures.  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    RECOMMENDATIONS: - Increase outpatient diuretics.  - Start evaluation for mitral valve repair/replacement.   Echocardiogram: 07/03/2022 IMPRESSIONS     1. Left ventricular ejection fraction, by estimation, is 25 to 30%. The  left ventricle has severely decreased function. The left ventricle  demonstrates regional wall motion abnormalities (see scoring  diagram/findings for description). The left  ventricular internal cavity size was severely dilated. Left ventricular  diastolic parameters are consistent with Grade III diastolic dysfunction  (restrictive). Elevated left atrial pressure.   2. Ventricular septum is flattened in systole suggesting RV pressure  overload. . Right ventricular systolic function is mildly reduced. The  right ventricular size is normal. There is moderately elevated pulmonary  artery systolic pressure.   3. Left atrial size was severely dilated.   4. Functional eccentric posterior directed MR difficult to quantify, at  least moderate possibly severe MR. MV/AV VTI ratio 1.3 suggesting moderate  to severe MR. The mitral valve is abnormal. Moderate to severe mitral  valve regurgitation. No evidence of  mitral stenosis.   5. The tricuspid valve is abnormal. Tricuspid valve regurgitation is  moderate to severe.   6. The aortic valve is tricuspid. There is  mild calcification of the  aortic valve. There is mild thickening of the aortic valve. Aortic valve  regurgitation is not visualized. No aortic stenosis is present.    Laboratory Data:  High Sensitivity Troponin:   Recent Labs  Lab 07/02/22 1135 07/02/22 1254  TROPONINIHS 22* 21*     Chemistry Recent Labs  Lab 07/02/22 1135 07/03/22 0417 07/04/22 0434  NA 133* 137 135  K 4.0 4.6 4.8  CL 101 101 99  CO2 25 29 28   GLUCOSE 106* 85 82  BUN 15 15 19   CREATININE 0.81 0.88 1.01*  CALCIUM 8.3* 8.4* 8.6*  GFRNONAA >60 >60 >60  ANIONGAP 7 7 8     Recent Labs  Lab 07/02/22 1135  PROT 7.3  ALBUMIN 3.0*  AST 26  ALT 35  ALKPHOS 83  BILITOT 1.8*   Lipids No results for input(s): "CHOL", "TRIG", "HDL", "LABVLDL", "LDLCALC", "CHOLHDL" in the last 168 hours.  Hematology Recent Labs  Lab 07/02/22 1100 07/04/22 0434  WBC 6.0 6.3  RBC 4.51 4.30  HGB 11.2* 10.5*  HCT 37.2 36.6  MCV 82.5 85.1  MCH 24.8* 24.4*  MCHC 30.1 28.7*  RDW 22.2* 21.9*  PLT 157 196   Thyroid No results for input(s): "TSH", "FREET4" in the last 168 hours.  BNP Recent Labs  Lab 07/02/22 1100  BNP 1,225.0*    DDimer No results for input(s): "DDIMER" in the last 168 hours.   Radiology/Studies:   DG Chest 2 View  Result Date: 07/02/2022 CLINICAL DATA:  Chest pain EXAM: CHEST - 2 VIEW COMPARISON:  02/13/2022 FINDINGS: Cardiomegaly and pulmonary vascular congestion. Airspace consolidation within the inferior aspect of the right upper lobe abutting the horizontal fissure. Left lung is clear. No pleural effusion or pneumothorax. IMPRESSION: 1. Right upper lobe pneumonia. 2. Cardiomegaly and pulmonary vascular congestion. Electronically Signed   By:  Nicholas  Plundo D.O.   On: 07/02/2022 12:56     Assessment and Plan:   1. Acute HFrEF - Presents with an acute CHF exacerbation and at least a 30+ pound weight gain over the past 2 months with associated dyspnea on exertion, orthopnea, PND and lower  extremity edema. Reports compliance with her medications and no significant change in her dietary habits.  - BNP at 1225 on admission and repeat echocardiogram shows her EF is further reduced to 25 to 30% with wall motion abnormalities as outlined above.  - She is receiving IV Lasix 80 mg twice daily and I's and O's not fully recorded but weight has declined by 5 pounds since admission. She still has evidence of significant volume overload on examination today and is 30+ pounds above her baseline weight. Will review with Dr. Wyline Mood in regards to further titration of diuretic therapy to TID dosing or a Lasix drip. If BP limits diuresis, she would require transfer to [facility] for evaluation by the Advanced Heart Failure team and initiation of inotropic support to assist with diuresis.  - She did receive Spironolactone 25 mg this morning but Toprol-XL and Losartan have been held. BP does not allow for switching from Losartan to [facility]. She was on Jardiance prior to admission and would plan to resume prior to discharge.  2. Mitral Valve Regurgitation - This was severe by TEE in 02/2022. Previously referred to the Structural Heart Team but missed her visits as she reports her daughter was sick at that time. This will need to be rescheduled as an outpatient. We reviewed the importance of keeping her appointment.  3. CAD - Catheterization in 05/2021 showed 40 to 50% proximal RCA stenosis but otherwise no significant disease. She remains on Atorvastatin 20 mg daily. She is not on ASA given the need for anticoagulation.  4. History of Recurrent PE/DVT - Remains on Coumadin for anticoagulation. INR at 1.4 and dosing being managed by pharmacy.  5. PNA - Remains on antibiotic therapy. Management per the admitting team.   For questions or updates, please contact [facility] Please consult [email or web address] for contact info under    Signed, Ellsworth Lennox, PA-C  07/04/2022 9:33 AM  Attending  note  Patient seen and discussed with PA Iran Ouch, I agree with her documentation. 47 yo female history of unprovoked PE on lifelong anticoag, pulmonary HTN, HTN, nonobstructive CAD, chronic HFrEF followed in HF clinic, severe functional MR, presents with SOB. Up 30 lbs from 04/2022 clinic visit. Reports medication compliance.    WBC 6 Hgb 11.2 Plt 157 BNP 1225 INR 1.4 K 4 Cr 0.81 BUN 15  Trop 22-->21 CXR RUL pneumonia, pulm congestion EKG SR, no specific ischemic changes 06/2022 echo: LVEF 25-30%, grade III dd, mild RV dysfunction, mod to severe functional MR  02/2022 echo: LVEF 30-35%, mod RV dysfunction, severe pulm HTN, severe MR 02/2022 RHC: mean PA 43, PCWP 27, CI 2.15, PVR 3 WU 02/2022 TEE LVEF 35%, severe funcational MR   1.Acute on chronic HFrEF - 02/2022 echo: LVEF 30-35%, mod RV dysfunction, severe pulm HTN, severe MR 02/2022 RHC: mean PA 43, PCWP 27, CI 2.15, PVR 3 WU 05/2021 LHC: 50% RCA otherwise normal 02/2022 TEE LVEF 35%, severe funcational MR 06/2022 echo: LVEF 25-30%, grade III dd, mild RV dysfunction, mod to severe functional MR cMRI ordered by has not scheduled.  - noncompliant with HF clinic f/u, missed structural heart evaluation twice.   - negative yesterday, since admission.  Weights however down 3 lbs from yesterday She is on IV lasix 80mg  bid, slight uptrend in Cr. Continue IV lasix, would not titrate dose today - admit weight 288, at 04/2022 appt 253 lbs.  - reds vest low quality - soft bp's limit medical therapy. She is on losartan 12.5, toprol 25, aldactone 25. Jardiance on hold in case NPO needed this admission at some point.   - continue IV lasix 80mg  bid. If issues with bp's or further uptrend in Cr may consider transfer to [facility] for HF team to manage. As long as diuresing is steady here with stable hemodyanmics and renal function ok to stay at [facility]   2.Severe functional MR - no showed for 2 appointment with structural heart team to consider  TEER   3. History of DVT/PE - she is on coumadin, her hematolgist has favored coumadin over DOACs for her historically - INR subtherapeutic. History of unprovoked saddle PE in 2015, I would brige with lovenox while INR subtherapeutic, pharm consult placed.   Dina Rich MD

## 2022-07-05 DIAGNOSIS — I5023 Acute on chronic systolic (congestive) heart failure: Secondary | ICD-10-CM | POA: Diagnosis not present

## 2022-07-05 DIAGNOSIS — Z7901 Long term (current) use of anticoagulants: Secondary | ICD-10-CM | POA: Diagnosis not present

## 2022-07-05 LAB — PROTIME-INR
INR: 1.7 — ABNORMAL HIGH (ref 0.8–1.2)
Prothrombin Time: 20.4 seconds — ABNORMAL HIGH (ref 11.4–15.2)

## 2022-07-05 LAB — BASIC METABOLIC PANEL
Anion gap: 9 (ref 5–15)
BUN: 20 mg/dL (ref 6–20)
CO2: 28 mmol/L (ref 22–32)
Calcium: 8.5 mg/dL — ABNORMAL LOW (ref 8.9–10.3)
Chloride: 97 mmol/L — ABNORMAL LOW (ref 98–111)
Creatinine, Ser: 0.96 mg/dL (ref 0.44–1.00)
GFR, Estimated: >60 mL/min (ref >60)
Glucose, Bld: 87 mg/dL (ref 70–99)
Potassium: 5.3 mmol/L — ABNORMAL HIGH (ref 3.5–5.1)
Sodium: 134 mmol/L — ABNORMAL LOW (ref 135–145)

## 2022-07-05 LAB — CBC
HCT: 35.3 % — ABNORMAL LOW (ref 36.0–46.0)
Hemoglobin: 10.5 g/dL — ABNORMAL LOW (ref 12.0–15.0)
MCH: 24.8 pg — ABNORMAL LOW (ref 26.0–34.0)
MCHC: 29.7 g/dL — ABNORMAL LOW (ref 30.0–36.0)
MCV: 83.5 fL (ref 80.0–100.0)
Platelets: 206 10*3/uL (ref 150–400)
RBC: 4.23 MIL/uL (ref 3.87–5.11)
RDW: 21.8 % — ABNORMAL HIGH (ref 11.5–15.5)
WBC: 4.9 10*3/uL (ref 4.0–10.5)
nRBC: 0 % (ref 0.0–0.2)

## 2022-07-05 MED ORDER — WARFARIN SODIUM 7.5 MG PO TABS
12.5000 mg | ORAL_TABLET | Freq: Once | ORAL | Status: AC
  Administered 2022-07-05: 12.5 mg via ORAL
  Filled 2022-07-05: qty 1

## 2022-07-05 MED ORDER — SODIUM ZIRCONIUM CYCLOSILICATE 5 G PO PACK
5.0000 g | PACK | Freq: Once | ORAL | Status: AC
  Administered 2022-07-05: 5 g via ORAL
  Filled 2022-07-05: qty 1

## 2022-07-05 MED ORDER — METOPROLOL SUCCINATE ER 25 MG PO TB24
12.5000 mg | ORAL_TABLET | Freq: Every day | ORAL | Status: DC
  Administered 2022-07-06 – 2022-07-09 (×4): 12.5 mg via ORAL
  Filled 2022-07-05 (×4): qty 1

## 2022-07-05 NOTE — Progress Notes (Signed)
ANTICOAGULATION CONSULT NOTE -   Pharmacy Consult for warfarin + coumadin bridge Indication:  hx PE  Allergies  Allergen Reactions   Sulfa Antibiotics Itching, Swelling and Rash    Lip swelling    Patient Measurements: Height: 5\' 11"  (180.3 cm) Weight: 127.7 kg (281 lb 8.4 oz) IBW/kg (Calculated) : 70.8  Labs: Recent Labs    07/02/22 1100 07/02/22 1100 07/02/22 1135 07/02/22 1254 07/03/22 0417 07/04/22 0434 07/05/22 0520  HGB 11.2*  --   --   --   --  10.5* 10.5*  HCT 37.2  --   --   --   --  36.6 35.3*  PLT 157  --   --   --   --  196 206  LABPROT 17.0*  --   --   --  18.4* 17.1* 20.4*  INR 1.4*  --   --   --  1.5* 1.4* 1.7*  CREATININE  --    < > 0.81  --  0.88 1.01* 0.96  TROPONINIHS  --   --  22* 21*  --   --   --    < > = values in this interval not displayed.    Estimated Creatinine Clearance: 109.3 mL/min (by C-G formula based on SCr of 0.96 mg/dL).  Assessment: Pharmacy consulted to dose warfarin in patient with history of pulmonary embolism.  INR on admission is subtherapeutic at 1.4.  Home dose listed as 5 mg on Sun + Thurs and 10 mg ROW.  INR 1.5> 1.4>1.7 Trending up nicely with boosted warfarin dose. No adverse events noted  Goal of Therapy:  INR 2-3 Monitor platelets by anticoagulation protocol: Yes   Plan:  Repeat Warfarin 12.5 mg x 1 dose. Continue lovenox 1mg /kg q 12 Monitor daily INR and s/s of bleeding.  Caryl Asp, PharmD Clinical Pharmacist 07/05/2022 8:11 AM

## 2022-07-05 NOTE — Progress Notes (Addendum)
PROGRESS NOTE     [name] [name], is a 47 y.o. female, DOB - 24-May-1975, ZOX:[id number]  Admit date - 07/02/2022   Admitting Physician Azucena Fallen, MD  Outpatient Primary MD for the patient is Benetta Spar, MD  LOS - 3  Chief Complaint  Patient presents with   Chest Pain        Brief Narrative:  47 y.o. female with medical history significant of heart failure with reduced ejection fraction (last echo February 2024 EF 30 to 35%), nonsustained ventricular tachycardia, CAD, hypertension, history of PE/DVT, pulmonary hypertension, severe mitral regurgitation, obesity admitted on 07/02/2022 with acute on chronic combined diastolic and systolic CHF exacerbation    -Assessment and Plan: 1) acute on chronic combined systolic and diastolic dysfunction/exacerbation -Repeat echo from 07/03/2022 with EF down to 25-30% (Prior EF was 30 to 35% in February 2024) with regional wall motion abnormalities (The entire lateral wall, entire inferior wall, apical anterior segment  and apex are akinetic) , severely dilated left ventricular internal cavity size, moderate pulmonary hypertension -Echo also shows grade 3 diastolic dysfunction CHF and RV pressure overload -Moderate to severe MR -No aortic stenosis -Patient had right heart cath on 03/08/2022 recommended possible mitral valve replacement at the time -Continue IV Lasix 80 mg every 12, metoprolol , losartan and Aldactone -Daily weight fluid input and output monitoring weight and REDs Vest -Cardiology consult appreciated -Soft BPs limiting titration of diuretics -May need to be transferred to [facility] for inotropes to support diuresis if BP is too soft to tolerate bolus Lasix -Baseline weight 252 to 255 pounds-- -at the time of admission this time around weight was almost 30 pounds up 07/05/22 -Negative fluid balance- -weight trending down -Dyspnea and hypoxia improving -Continue IV diuresis   2) history of DVT/PE--- prior  history of unprovoked saddle PE  continue chronic anticoagulation with Coumadin -c/n Lovenox bridge -INR trending up  3)Obesity- -Low calorie diet, portion control and increase physical activity discussed with patient -Body mass index is 39.27 kg/m.  4) valvular heart disease--moderate to severe MR--see #1 above -Patient is to follow-up as outpatient with structural heart team for possible mitral valve repair  5) acute hypoxic respiratory failure--- due to 1 above -Hypoxia improving with diuresis  6)Hyperkalemia--Lokelma as ordered  Status is: Inpatient   Disposition: The patient is from: Home              Anticipated d/c is to: Home              Anticipated d/c date is: 3 days              Patient currently is not medically stable to d/c. Barriers: Not Clinically Stable-   Code Status :  -  Code Status: Full Code   Family Communication:    NA (patient is alert, awake and coherent)  -Discussed with her boyfriend at bedside  DVT Prophylaxis  :   - SCDs /Coumadin     Lab Results  Component Value Date   PLT 206 07/05/2022    Inpatient Medications  Scheduled Meds:  atorvastatin  20 mg Oral Daily   azithromycin  250 mg Oral Daily   dextromethorphan-guaiFENesin  1 tablet Oral BID   enoxaparin (LOVENOX) injection  130 mg Subcutaneous Q12H   furosemide  80 mg Intravenous Q12H   losartan  12.5 mg Oral Daily   [START ON 07/06/2022] metoprolol succinate  12.5 mg Oral Daily   sodium chloride flush  3 mL Intravenous  Q12H   Warfarin - Pharmacist Dosing Inpatient   Does not apply q1600   Continuous Infusions:  sodium chloride     PRN Meds:.sodium chloride, acetaminophen, benzonatate, ondansetron (ZOFRAN) IV, sodium chloride flush   Anti-infectives (From admission, onward)    Start     Dose/Rate Route Frequency Ordered Stop   07/03/22 1000  azithromycin (ZITHROMAX) tablet 250 mg        250 mg Oral Daily 07/02/22 1728 07/07/22 0959   07/02/22 1345  cefTRIAXone (ROCEPHIN)  1 g in sodium chloride 0.9 % 100 mL IVPB        1 g 200 mL/hr over 30 Minutes Intravenous  Once 07/02/22 1344 07/02/22 1523   07/02/22 1345  azithromycin (ZITHROMAX) 500 mg in sodium chloride 0.9 % 250 mL IVPB        500 mg 250 mL/hr over 60 Minutes Intravenous  Once 07/02/22 1344 07/02/22 1710         Subjective: [name] [name] today has no fevers, no emesis,  No chest pain,   -Dyspnea and hypoxia improving -Voiding well -Boyfriend at bedside, questions answered  Objective: Vitals:   07/04/22 2047 07/05/22 0452 07/05/22 0820 07/05/22 1335  BP: 112/77 (!) 122/91 113/87 106/76  Pulse: 67 69 64 63  Resp: 16 16  20   Temp: 97.7 F (36.5 C) (!) 97.5 F (36.4 C)  97.9 F (36.6 C)  TempSrc: Oral Oral  Oral  SpO2: 100% 100%  97%  Weight:  127.7 kg    Height:        Intake/Output Summary (Last 24 hours) at 07/05/2022 1849 Last data filed at 07/05/2022 1700 Gross per 24 hour  Intake 1200 ml  Output 2800 ml  Net -1600 ml   Filed Weights   07/03/22 0325 07/04/22 0500 07/05/22 0452  Weight: 129.8 kg 128.4 kg 127.7 kg    Physical Exam  Gen:- Awake Alert, obese no conversational dyspnea but has dyspnea on exertion HEENT:- [place].AT, No sclera icterus Nose- [place] 1-2L/min Neck-Supple Neck, +ve JVD,.  Lungs-breath sounds, faint bibasilar rales  CV- S1, S2 normal, regular  Abd-  +ve B.Sounds, Abd Soft, No tenderness, increased truncal adiposity Extremity/Skin:- +ve  edema, pedal pulses present  Psych-affect is appropriate, oriented x3 Neuro-generalized weakness, no new focal deficits, no tremors  Data Reviewed: I have personally reviewed following labs and imaging studies  CBC: Recent Labs  Lab 07/02/22 1100 07/04/22 0434 07/05/22 0520  WBC 6.0 6.3 4.9  HGB 11.2* 10.5* 10.5*  HCT 37.2 36.6 35.3*  MCV 82.5 85.1 83.5  PLT 157 196 206   Basic Metabolic Panel: Recent Labs  Lab 07/02/22 1135 07/03/22 0417 07/04/22 0434 07/05/22 0520  NA 133* 137 135 134*  K 4.0 4.6 4.8  5.3*  CL 101 101 99 97*  CO2 25 29 28 28   GLUCOSE 106* 85 82 87  BUN 15 15 19 20   CREATININE 0.81 0.88 1.01* 0.96  CALCIUM 8.3* 8.4* 8.6* 8.5*   GFR: Estimated Creatinine Clearance: 109.3 mL/min (by C-G formula based on SCr of 0.96 mg/dL). Liver Function Tests: Recent Labs  Lab 07/02/22 1135  AST 26  ALT 35  ALKPHOS 83  BILITOT 1.8*  PROT 7.3  ALBUMIN 3.0*   Recent Results (from the past 240 hour(s))  Resp panel by RT-PCR (RSV, Flu A&B, Covid) Anterior Nasal Swab     Status: None   Collection Time: 07/02/22  3:08 PM   Specimen: Anterior Nasal Swab  Result Value Ref Range Status  SARS Coronavirus 2 by RT PCR NEGATIVE NEGATIVE Final    Comment: (NOTE) SARS-CoV-2 target nucleic acids are NOT DETECTED.  The SARS-CoV-2 RNA is generally detectable in upper respiratory specimens during the acute phase of infection. The lowest concentration of SARS-CoV-2 viral copies this assay can detect is 138 copies/mL. A negative result does not preclude SARS-Cov-2 infection and should not be used as the sole basis for treatment or other patient management decisions. A negative result may occur with  improper specimen collection/handling, submission of specimen other than nasopharyngeal swab, presence of viral mutation(s) within the areas targeted by this assay, and inadequate number of viral copies(<138 copies/mL). A negative result must be combined with clinical observations, patient history, and epidemiological information. The expected result is Negative.  Fact Sheet for Patients:  BloggerCourse.com  Fact Sheet for Healthcare Providers:  SeriousBroker.it  This test is no t yet approved or cleared by the [place] FDA and  has been authorized for detection and/or diagnosis of SARS-CoV-2 by FDA under an Emergency Use Authorization (EUA). This EUA will remain  in effect (meaning this test can be used) for the duration of  the COVID-19 declaration under Section 564(b)(1) of the Act, 21 U.S.C.section 360bbb-3(b)(1), unless the authorization is terminated  or revoked sooner.       Influenza A by PCR NEGATIVE NEGATIVE Final   Influenza B by PCR NEGATIVE NEGATIVE Final    Comment: (NOTE) The Xpert Xpress SARS-CoV-2/FLU/RSV plus assay is intended as an aid in the diagnosis of influenza from Nasopharyngeal swab specimens and should not be used as a sole basis for treatment. Nasal washings and aspirates are unacceptable for Xpert Xpress SARS-CoV-2/FLU/RSV testing.  Fact Sheet for Patients: BloggerCourse.com  Fact Sheet for Healthcare Providers: SeriousBroker.it  This test is not yet approved or cleared by the [place] FDA and has been authorized for detection and/or diagnosis of SARS-CoV-2 by FDA under an Emergency Use Authorization (EUA). This EUA will remain in effect (meaning this test can be used) for the duration of the COVID-19 declaration under Section 564(b)(1) of the Act, 21 U.S.C. section 360bbb-3(b)(1), unless the authorization is terminated or revoked.     Resp Syncytial Virus by PCR NEGATIVE NEGATIVE Final    Comment: (NOTE) Fact Sheet for Patients: BloggerCourse.com  Fact Sheet for Healthcare Providers: SeriousBroker.it  This test is not yet approved or cleared by the [place] FDA and has been authorized for detection and/or diagnosis of SARS-CoV-2 by FDA under an Emergency Use Authorization (EUA). This EUA will remain in effect (meaning this test can be used) for the duration of the COVID-19 declaration under Section 564(b)(1) of the Act, 21 U.S.C. section 360bbb-3(b)(1), unless the authorization is terminated or revoked.  Performed at [facility], [address], [place], [place] [address]     Radiology Studies: No results found.  Scheduled Meds:  atorvastatin  20 mg  Oral Daily   azithromycin  250 mg Oral Daily   dextromethorphan-guaiFENesin  1 tablet Oral BID   enoxaparin (LOVENOX) injection  130 mg Subcutaneous Q12H   furosemide  80 mg Intravenous Q12H   losartan  12.5 mg Oral Daily   [START ON 07/06/2022] metoprolol succinate  12.5 mg Oral Daily   sodium chloride flush  3 mL Intravenous Q12H   Warfarin - Pharmacist Dosing Inpatient   Does not apply q1600   Continuous Infusions:  sodium chloride      LOS: 3 days   Shon Hale M.D on 07/05/2022 at 6:49 PM  Go to [email or web address] - for contact info  [facility] - Office  [phone]  If 7PM-7AM, please contact night-coverage [email or web address] 07/05/2022, 6:49 PM

## 2022-07-05 NOTE — Care Management Important Message (Signed)
Important Message  Patient Details  Name: [name] [name] MRN: [id number] Date of Birth: 07-04-1975   Medicare Important Message Given:  N/A - LOS <3 / Initial given by admissions     Corey Harold 07/05/2022, 11:25 AM

## 2022-07-05 NOTE — Progress Notes (Signed)
Rounding Note    Patient Name: [name] [name] Date of Encounter: 07/05/2022  [facility] Cardiologist: Dina Rich, MD   Subjective   Some ongoing SOB  Inpatient Medications    Scheduled Meds:  atorvastatin  20 mg Oral Daily   azithromycin  250 mg Oral Daily   dextromethorphan-guaiFENesin  1 tablet Oral BID   enoxaparin (LOVENOX) injection  130 mg Subcutaneous Q12H   furosemide  80 mg Intravenous Q12H   losartan  12.5 mg Oral Daily   metoprolol succinate  25 mg Oral Daily   sodium chloride flush  3 mL Intravenous Q12H   warfarin  12.5 mg Oral ONCE-1600   Warfarin - Pharmacist Dosing Inpatient   Does not apply q1600   Continuous Infusions:  sodium chloride     PRN Meds: sodium chloride, acetaminophen, benzonatate, ondansetron (ZOFRAN) IV, sodium chloride flush   Vital Signs    Vitals:   07/04/22 2002 07/04/22 2047 07/05/22 0452 07/05/22 0820  BP: 113/78 112/77 (!) 122/91 113/87  Pulse: 69 67 69 64  Resp:  16 16   Temp:  97.7 F (36.5 C) (!) 97.5 F (36.4 C)   TempSrc: Oral Oral Oral   SpO2:  100% 100%   Weight:   127.7 kg   Height:        Intake/Output Summary (Last 24 hours) at 07/05/2022 0907 Last data filed at 07/05/2022 0500 Gross per 24 hour  Intake 960 ml  Output 2300 ml  Net -1340 ml      07/05/2022    4:52 AM 07/04/2022    5:00 AM 07/03/2022    3:25 AM  Last 3 Weights  Weight (lbs) 281 lb 8.4 oz 283 lb 1.1 oz 286 lb 2.5 oz  Weight (kg) 127.7 kg 128.4 kg 129.8 kg      Telemetry    NSR - Personally Reviewed  ECG    N/a - Personally Reviewed  Physical Exam   GEN: No acute distress.   Neck: + JVD Cardiac: RRR, no murmurs, rubs, or gallops.  Respiratory: bilateral crackles GI: Soft, nontender, non-distended  MS: trace bilateral edema Neuro:  Nonfocal  Psych: Normal affect   Labs    High Sensitivity Troponin:   Recent Labs  Lab 07/02/22 1135 07/02/22 1254  TROPONINIHS 22* 21*     Chemistry Recent Labs  Lab  07/02/22 1135 07/03/22 0417 07/04/22 0434 07/05/22 0520  NA 133* 137 135 134*  K 4.0 4.6 4.8 5.3*  CL 101 101 99 97*  CO2 25 29 28 28   GLUCOSE 106* 85 82 87  BUN 15 15 19 20   CREATININE 0.81 0.88 1.01* 0.96  CALCIUM 8.3* 8.4* 8.6* 8.5*  PROT 7.3  --   --   --   ALBUMIN 3.0*  --   --   --   AST 26  --   --   --   ALT 35  --   --   --   ALKPHOS 83  --   --   --   BILITOT 1.8*  --   --   --   GFRNONAA >60 >60 >60 >60  ANIONGAP 7 7 8 9     Lipids No results for input(s): "CHOL", "TRIG", "HDL", "LABVLDL", "LDLCALC", "CHOLHDL" in the last 168 hours.  Hematology Recent Labs  Lab 07/02/22 1100 07/04/22 0434 07/05/22 0520  WBC 6.0 6.3 4.9  RBC 4.51 4.30 4.23  HGB 11.2* 10.5* 10.5*  HCT 37.2 36.6 35.3*  MCV 82.5  85.1 83.5  MCH 24.8* 24.4* 24.8*  MCHC 30.1 28.7* 29.7*  RDW 22.2* 21.9* 21.8*  PLT 157 196 206   Thyroid No results for input(s): "TSH", "FREET4" in the last 168 hours.  BNP Recent Labs  Lab 07/02/22 1100  BNP 1,225.0*    DDimer No results for input(s): "DDIMER" in the last 168 hours.   Radiology    ECHOCARDIOGRAM COMPLETE  Result Date: 07/03/2022    ECHOCARDIOGRAM REPORT   Patient Name:   [name] [name] Date of Exam: 07/03/2022 Medical Rec #:  [id number]       Height:       71.0 in Accession #:    [id number]      Weight:       286.2 lb Date of Birth:  1975/03/13       BSA:          2.455 m Patient Age:    47 years        BP:           100/69 mmHg Patient Gender: F               HR:           59 bpm. [facility] Procedure: 2D Echo, Cardiac Doppler, Color Doppler and Intracardiac            Opacification Agent Indications:    Congestive Heart Failure I50.9  History:        Patient has prior history of Echocardiogram examinations, most                 recent 03/14/2022. CHF and Cardiomyopathy, CAD, P.E., Mitral                 Valve Disease; Risk Factors:Current Smoker, Hypertension and                 Sleep Apnea.  Sonographer:    Aron Baba Referring  Phys: [id number] Azucena Fallen  Sonographer Comments: Image acquisition challenging due to patient body habitus. IMPRESSIONS  1. Left ventricular ejection fraction, by estimation, is 25 to 30%. The left ventricle has severely decreased function. The left ventricle demonstrates regional wall motion abnormalities (see scoring diagram/findings for description). The left ventricular internal cavity size was severely dilated. Left ventricular diastolic parameters are consistent with Grade III diastolic dysfunction (restrictive). Elevated left atrial pressure.  2. Ventricular septum is flattened in systole suggesting RV pressure overload. . Right ventricular systolic function is mildly reduced. The right ventricular size is normal. There is moderately elevated pulmonary artery systolic pressure.  3. Left atrial size was severely dilated.  4. Functional eccentric posterior directed MR difficult to quantify, at least moderate possibly severe MR. MV/AV VTI ratio 1.3 suggesting moderate to severe MR. The mitral valve is abnormal. Moderate to severe mitral valve regurgitation. No evidence of mitral stenosis.  5. The tricuspid valve is abnormal. Tricuspid valve regurgitation is moderate to severe.  6. The aortic valve is tricuspid. There is mild calcification of the aortic valve. There is mild thickening of the aortic valve. Aortic valve regurgitation is not visualized. No aortic stenosis is present. FINDINGS  Left Ventricle: Left ventricular ejection fraction, by estimation, is 25 to 30%. The left ventricle has severely decreased function. The left ventricle demonstrates regional wall motion abnormalities. Definity contrast agent was given IV to delineate the left ventricular endocardial borders. The left ventricular internal cavity size was severely dilated. There is no left ventricular hypertrophy. Left  ventricular diastolic parameters are consistent with Grade III diastolic dysfunction (restrictive). Elevated left atrial  pressure.  LV Wall Scoring: The entire lateral wall, entire inferior wall, apical anterior segment, and apex are akinetic. Right Ventricle: Ventricular septum is flattened in systole suggesting RV pressure overload. The right ventricular size is normal. Right vetricular wall thickness was not well visualized. Right ventricular systolic function is mildly reduced. There is moderately elevated pulmonary artery systolic pressure. The tricuspid regurgitant velocity is 3.00 m/s, and with an assumed right atrial pressure of 15 mmHg, the estimated right ventricular systolic pressure is 51.0 mmHg. Left Atrium: Left atrial size was severely dilated. Right Atrium: Right atrial size was normal in size. Pericardium: There is no evidence of pericardial effusion. Mitral Valve: Functional eccentric posterior directed MR difficult to quantify, at least moderate possibly severe MR. MV/AV VTI ratio 1.3 suggesting moderate to severe MR. The mitral valve is abnormal. Moderate to severe mitral valve regurgitation. No evidence of mitral valve stenosis. Tricuspid Valve: The tricuspid valve is abnormal. Tricuspid valve regurgitation is moderate to severe. No evidence of tricuspid stenosis. Aortic Valve: The aortic valve is tricuspid. There is mild calcification of the aortic valve. There is mild thickening of the aortic valve. There is mild aortic valve annular calcification. Aortic valve regurgitation is not visualized. No aortic stenosis  is present. Aortic valve mean gradient measures 2.9 mmHg. Aortic valve peak gradient measures 5.1 mmHg. Aortic valve area, by VTI measures 1.62 cm. Pulmonic Valve: The pulmonic valve was not well visualized. Pulmonic valve regurgitation is mild. No evidence of pulmonic stenosis. Aorta: The aortic root and ascending aorta are structurally normal, with no evidence of dilitation. IAS/Shunts: The interatrial septum was not well visualized.  LEFT VENTRICLE PLAX 2D LVIDd:         7.50 cm   Diastology LVIDs:          6.30 cm   LV e' medial:    5.61 cm/s LV PW:         0.90 cm   LV E/e' medial:  24.6 LV IVS:        0.70 cm   LV e' lateral:   6.83 cm/s LVOT diam:     1.90 cm   LV E/e' lateral: 20.2 LV SV:         35 LV SV Index:   14 LVOT Area:     2.84 cm  RIGHT VENTRICLE RV S prime:     7.71 cm/s TAPSE (M-mode): 1.3 cm LEFT ATRIUM            Index        RIGHT ATRIUM           Index LA diam:      6.00 cm  2.44 cm/m   RA Area:     25.90 cm LA Vol (A4C): 132.0 ml 53.76 ml/m  RA Volume:   80.50 ml  32.79 ml/m  AORTIC VALVE                    PULMONIC VALVE AV Area (Vmax):    1.56 cm     PR End Diast Vel: 9.18 msec AV Area (Vmean):   1.43 cm AV Area (VTI):     1.62 cm AV Vmax:           113.34 cm/s AV Vmean:          78.829 cm/s AV VTI:            0.219  m AV Peak Grad:      5.1 mmHg AV Mean Grad:      2.9 mmHg LVOT Vmax:         62.20 cm/s LVOT Vmean:        39.800 cm/s LVOT VTI:          0.125 m LVOT/AV VTI ratio: 0.57  AORTA Ao Root diam: 2.90 cm Ao Asc diam:  2.85 cm MITRAL VALVE                 TRICUSPID VALVE MV Area (PHT): 5.38 cm      TR Peak grad:   36.0 mmHg MV Decel Time: 141 msec      TR Vmax:        300.00 cm/s MR Peak grad:   60.7 mmHg MR Mean grad:   42.0 mmHg    SHUNTS MR Vmax:        389.50 cm/s  Systemic VTI:  0.12 m MR Vmean:       307.0 cm/s   Systemic Diam: 1.90 cm MR PISA:        9.05 cm MR PISA Radius: 1.20 cm MV E velocity: 138.00 cm/s MV A velocity: 31.00 cm/s MV E/A ratio:  4.45 Dina Rich MD Electronically signed by Dina Rich MD Signature Date/Time: 07/03/2022/10:14:34 AM    Final     Cardiac Studies    Patient Profile     [name] [name] is a 47 y.o. female with a hx of HFrEF (EF 45-50% in 05/2021 with cath showing 40-50% proximal RCA stenosis but otherwise normal cors, EF at 30-35% in 08/2021 and 09/2021, 35-40% in 01/2022 and 30 to 35% in 02/2022), moderate to severe MR (severe by TEE in 02/2022), history of recurrent PE/DVT, HTN, NSVT and OSA (on CPAP) who is being seen  07/04/2022 for the evaluation of CHF at the request of Dr. Mariea Clonts.    Assessment & Plan    1.Acute on chronic HFrEF - 02/2022 echo: LVEF 30-35%, mod RV dysfunction, severe pulm HTN, severe MR 02/2022 RHC: mean PA 43, PCWP 27, CI 2.15, PVR 3 WU 05/2021 LHC: 50% RCA otherwise normal 02/2022 TEE LVEF 35%, severe funcational MR 06/2022 echo: LVEF 25-30%, grade III dd, mild RV dysfunction, mod to severe functional MR cMRI ordered by has not scheduled.  - noncompliant with HF clinic f/u, missed structural heart evaluation twice.  - Up 30 lbs from 04/2022 clinic visit on admission   - negative 1.1L yesterday, neg 1.5L since admission. Weights down 2 lbs over 24 hrs and 5 lbs since admission. She is on IV lasix 80mg  bid, slight variation in Cr without clear trend. Continue IV lasix, if stable Cr over next few days may titrate dose, with initial Cr bump now resolving and some soft bp's would hold steady for now for time being.  - admit weight 288, at 04/2022 appt 253 lbs.  - reds vest low quality - soft bp's limit medical therapy. Will lower her toprol form 25mg  to 12.5mg , continue losartan 12.5mg . Continue to hold aldactone for now. [place] Pepper on hold in case NPO needed this admission at some point.    -she is diuresing with stable renal function and hemodynamics, ok to stay at [facility] for continued IV diuresis.     2.Severe functional MR - no showed for 2 appointment with structural heart team to consider TEER     3. History of DVT/PE - she is on coumadin, her hematolgist has favored coumadin  over DOACs for her historically - INR subtherapeutic. History of unprovoked saddle PE in 2015, I would brige with lovenox while INR subtherapeutic, pharm consult placed.   For questions or updates, please contact [facility] Please consult [email or web address] for contact info under        Signed, Dina Rich, MD  07/05/2022, 9:07 AM

## 2022-07-06 DIAGNOSIS — Z7901 Long term (current) use of anticoagulants: Secondary | ICD-10-CM | POA: Diagnosis not present

## 2022-07-06 DIAGNOSIS — D508 Other iron deficiency anemias: Secondary | ICD-10-CM | POA: Diagnosis not present

## 2022-07-06 DIAGNOSIS — I5043 Acute on chronic combined systolic (congestive) and diastolic (congestive) heart failure: Secondary | ICD-10-CM | POA: Diagnosis not present

## 2022-07-06 DIAGNOSIS — J189 Pneumonia, unspecified organism: Secondary | ICD-10-CM | POA: Diagnosis not present

## 2022-07-06 LAB — PROTIME-INR
INR: 2.1 — ABNORMAL HIGH (ref 0.8–1.2)
Prothrombin Time: 23.9 seconds — ABNORMAL HIGH (ref 11.4–15.2)

## 2022-07-06 LAB — BASIC METABOLIC PANEL
Anion gap: 9 (ref 5–15)
BUN: 17 mg/dL (ref 6–20)
CO2: 29 mmol/L (ref 22–32)
Calcium: 8.4 mg/dL — ABNORMAL LOW (ref 8.9–10.3)
Chloride: 96 mmol/L — ABNORMAL LOW (ref 98–111)
Creatinine, Ser: 0.94 mg/dL (ref 0.44–1.00)
GFR, Estimated: >60 mL/min (ref >60)
Glucose, Bld: 84 mg/dL (ref 70–99)
Potassium: 4 mmol/L (ref 3.5–5.1)
Sodium: 134 mmol/L — ABNORMAL LOW (ref 135–145)

## 2022-07-06 LAB — MAGNESIUM: Magnesium: 1.6 mg/dL — ABNORMAL LOW (ref 1.7–2.4)

## 2022-07-06 MED ORDER — WARFARIN SODIUM 5 MG PO TABS
10.0000 mg | ORAL_TABLET | Freq: Once | ORAL | Status: AC
  Administered 2022-07-06: 10 mg via ORAL
  Filled 2022-07-06: qty 2

## 2022-07-06 MED ORDER — ACETAZOLAMIDE SODIUM 500 MG IJ SOLR
500.0000 mg | Freq: Once | INTRAMUSCULAR | Status: AC
  Administered 2022-07-07: 500 mg via INTRAVENOUS
  Filled 2022-07-06: qty 500

## 2022-07-06 MED ORDER — MAGNESIUM SULFATE 2 GM/50ML IV SOLN
2.0000 g | Freq: Once | INTRAVENOUS | Status: AC
  Administered 2022-07-06: 2 g via INTRAVENOUS
  Filled 2022-07-06: qty 50

## 2022-07-06 NOTE — TOC Progression Note (Signed)
Transition of Care [facility]) - Progression Note    Patient Details  Name: [name] [name] MRN: [id number] Date of Birth: [birth year]  [facility] of [facility]) CM/SW Contact  Catalina Gravel, LCSW Phone Number: 07/06/2022, 3:40 PM  Clinical Narrative:     Pt improving diuresing well, DC 2-3 days. TOC to follow.  Expected Discharge Plan: Home/Self Care Barriers to Discharge: Continued Medical Work up  Expected Discharge Plan and Services In-house Referral: Clinical Social Work     Living arrangements for the past 2 months: Single Family Home                                       Social Determinants of Health (SDOH) Interventions SDOH Screenings   Food Insecurity: No Food Insecurity (07/02/2022)  Housing: Low Risk  (07/02/2022)  Transportation Needs: No Transportation Needs (07/02/2022)  Utilities: Not At Risk (07/02/2022)  Depression (PHQ2-9): Low Risk  (03/12/2022)  Tobacco Use: High Risk (07/02/2022)    Readmission Risk Interventions    07/03/2022   12:12 PM 02/13/2022   12:41 PM 12/31/2021    1:52 PM  Readmission Risk Prevention Plan  Transportation Screening Complete Complete   HRI or Home Care Consult Complete Complete Complete  Social Work Consult for Recovery Care Planning/Counseling Complete Complete Complete  Palliative Care Screening Not Applicable Not Applicable Not Applicable  Medication Review Oceanographer) Complete Complete Complete

## 2022-07-06 NOTE — Plan of Care (Signed)
  Problem: Activity: Goal: Capacity to carry out activities will improve Outcome: Progressing   Problem: Cardiac: Goal: Ability to achieve and maintain adequate cardiopulmonary perfusion will improve Outcome: Progressing   Problem: Elimination: Goal: Will not experience complications related to bowel motility 07/06/2022 2220 by Damita Lack, RN Outcome: Progressing 07/06/2022 2219 by Damita Lack, RN Outcome: Progressing   Problem: Elimination: Goal: Will not experience complications related to urinary retention 07/06/2022 2220 by Damita Lack, RN Outcome: Progressing 07/06/2022 2219 by Damita Lack, RN Outcome: Progressing

## 2022-07-06 NOTE — Progress Notes (Signed)
ANTICOAGULATION CONSULT NOTE -   Pharmacy Consult for warfarin + coumadin bridge Indication:  hx PE /DVT  Allergies  Allergen Reactions   Sulfa Antibiotics Itching, Swelling and Rash    Lip swelling    Patient Measurements: Height: 5\' 11"  (180.3 cm) Weight: 126.1 kg (278 lb 1.6 oz) IBW/kg (Calculated) : 70.8  Labs: Recent Labs    07/04/22 0434 07/05/22 0520 07/06/22 0415  HGB 10.5* 10.5*  --   HCT 36.6 35.3*  --   PLT 196 206  --   LABPROT 17.1* 20.4* 23.9*  INR 1.4* 1.7* 2.1*  CREATININE 1.01* 0.96 0.94    Estimated Creatinine Clearance: 110.8 mL/min (by C-G formula based on SCr of 0.94 mg/dL).  Assessment: Pharmacy consulted to dose warfarin in patient with history of pulmonary embolism.  INR on admission is subtherapeutic at 1.4. Home dose listed as 5 mg on Sun + Thurs and 10 mg ROW.  INR 1.5> 1.4>1.7>2.1 Now therapeutic. No adverse events noted, will resume home dose.   Goal of Therapy:  INR 2-3 Monitor platelets by anticoagulation protocol: Yes   Plan:  Warfarin 10mg  po x1 May dc lovenox after today's dose Monitor daily INR and s/s of bleeding.  Caryl Asp, PharmD Clinical Pharmacist 07/06/2022 8:29 AM

## 2022-07-06 NOTE — Progress Notes (Signed)
PROGRESS NOTE     [name] [name], is a 47 y.o. female, DOB - [birth year], UEA:[id number]  Admit date - 07/02/2022   Admitting Physician Azucena Fallen, MD  Outpatient Primary MD for the patient is Benetta Spar, MD  LOS - 4  Chief Complaint  Patient presents with   Chest Pain        Brief Narrative:  47 y.o. female with medical history significant of heart failure with reduced ejection fraction (last echo February 2024 EF 30 to 35%), nonsustained ventricular tachycardia, CAD, hypertension, history of PE/DVT, pulmonary hypertension, severe mitral regurgitation, obesity admitted on 07/02/2022 with acute on chronic combined diastolic and systolic CHF exacerbation    -Assessment and Plan: 1) acute on chronic combined systolic and diastolic dysfunction/exacerbation -Repeat echo from 07/03/2022 with EF down to 25-30% (Prior EF was 30 to 35% in February 2024) with regional wall motion abnormalities (The entire lateral wall, entire inferior wall, apical anterior segment  and apex are akinetic) , severely dilated left ventricular internal cavity size, moderate pulmonary hypertension -Echo also shows grade 3 diastolic dysfunction CHF and RV pressure overload -Moderate to severe MR -No aortic stenosis -Patient had right heart cath on 03/08/2022 recommended possible mitral valve replacement at the time -Continue IV Lasix 80 mg every 12, metoprolol , losartan and Aldactone -Daily weight fluid input and output monitoring weight and REDs Vest -Cardiology consult appreciated -Soft BPs limiting titration of diuretics -May need to be transferred to [facility] for inotropes to support diuresis if BP is too soft to tolerate bolus Lasix -Baseline weight 252 to 255 pounds-- -at the time of admission this time around weight was almost 30 pounds up 07/06/22 -Negative fluid balance- -weight trending down---currently down about 10 pounds since admission -Dyspnea and hypoxia improving -Weaned  off oxygen as of 07/05/2022 -Continue IV diuresis  2) history of DVT/PE--- prior history of unprovoked saddle PE  continue chronic anticoagulation with Coumadin -INR is therapeutic, okay to stop Lovenox bridge as of 07/06/2022  3)Obesity- -Low calorie diet, portion control and increase physical activity discussed with patient -Body mass index is 38.79 kg/m.  4) valvular heart disease--moderate to severe MR--see #1 above -Patient is to follow-up as outpatient with structural heart team for possible mitral valve repair  5) acute hypoxic respiratory failure--- due to 1 above -Hypoxia improving with diuresis  6)Hyperkalemia--Lokelma as ordered  Status is: Inpatient   Disposition: The patient is from: Home              Anticipated d/c is to: Home              Anticipated d/c date is: 3 days              Patient currently is not medically stable to d/c. Barriers: Not Clinically Stable-   Code Status :  -  Code Status: Full Code   Family Communication:    NA (patient is alert, awake and coherent)  -Discussed with her boyfriend at bedside  DVT Prophylaxis  :   - SCDs /Coumadin   warfarin (COUMADIN) tablet 10 mg   Lab Results  Component Value Date   PLT 206 07/05/2022    Inpatient Medications  Scheduled Meds:  [START ON 07/07/2022] acetaZOLAMIDE  500 mg Intravenous Once   atorvastatin  20 mg Oral Daily   dextromethorphan-guaiFENesin  1 tablet Oral BID   enoxaparin (LOVENOX) injection  130 mg Subcutaneous Q12H   furosemide  80 mg Intravenous Q12H   losartan  12.5  mg Oral Daily   metoprolol succinate  12.5 mg Oral Daily   sodium chloride flush  3 mL Intravenous Q12H   warfarin  10 mg Oral ONCE-1600   Warfarin - Pharmacist Dosing Inpatient   Does not apply q1600   Continuous Infusions:  sodium chloride     PRN Meds:.sodium chloride, acetaminophen, benzonatate, ondansetron (ZOFRAN) IV, sodium chloride flush   Anti-infectives (From admission, onward)    Start      Dose/Rate Route Frequency Ordered Stop   07/03/22 1000  azithromycin (ZITHROMAX) tablet 250 mg        250 mg Oral Daily 07/02/22 1728 07/06/22 0830   07/02/22 1345  cefTRIAXone (ROCEPHIN) 1 g in sodium chloride 0.9 % 100 mL IVPB        1 g 200 mL/hr over 30 Minutes Intravenous  Once 07/02/22 1344 07/02/22 1523   07/02/22 1345  azithromycin (ZITHROMAX) 500 mg in sodium chloride 0.9 % 250 mL IVPB        500 mg 250 mL/hr over 60 Minutes Intravenous  Once 07/02/22 1344 07/02/22 1710        Subjective: [name] today has no fevers, no emesis,  No chest pain,   -Dyspnea and hypoxia improving -Weaned off oxygen -Continues to void well  Objective: Vitals:   07/06/22 0416 07/06/22 0842 07/06/22 0844 07/06/22 1300  BP: 121/87 121/76 121/76 105/73  Pulse: 72 68 68 64  Resp: 20 18  20   Temp: 98.2 F (36.8 C) 97.6 F (36.4 C)  98.2 F (36.8 C)  TempSrc: Oral Oral  Oral  SpO2: 98% 99%  99%  Weight: 126.1 kg     Height:        Intake/Output Summary (Last 24 hours) at 07/06/2022 1506 Last data filed at 07/06/2022 1230 Gross per 24 hour  Intake 720 ml  Output 3900 ml  Net -3180 ml   Filed Weights   07/04/22 0500 07/05/22 0452 07/06/22 0416  Weight: 128.4 kg 127.7 kg 126.1 kg    Physical Exam  Gen:- Awake Alert, obese no conversational dyspnea but has dyspnea on exertion HEENT:- [place].AT, No sclera icterus Neck-Supple Neck, +ve JVD,.  Lungs-improving air movement, no wheezing CV- S1, S2 normal, regular  Abd-  +ve B.Sounds, Abd Soft, No tenderness, increased truncal adiposity Extremity/Skin:-Improving edema, pedal pulses present  Psych-affect is appropriate, oriented x3 Neuro-generalized weakness, no new focal deficits, no tremors  Data Reviewed: I have personally reviewed following labs and imaging studies  CBC: Recent Labs  Lab 07/02/22 1100 07/04/22 0434 07/05/22 0520  WBC 6.0 6.3 4.9  HGB 11.2* 10.5* 10.5*  HCT 37.2 36.6 35.3*  MCV 82.5 85.1 83.5  PLT 157 196  206   Basic Metabolic Panel: Recent Labs  Lab 07/02/22 1135 07/03/22 0417 07/04/22 0434 07/05/22 0520 07/06/22 0415  NA 133* 137 135 134* 134*  K 4.0 4.6 4.8 5.3* 4.0  CL 101 101 99 97* 96*  CO2 25 29 28 28 29   GLUCOSE 106* 85 82 87 84  BUN 15 15 19 20 17   CREATININE 0.81 0.88 1.01* 0.96 0.94  CALCIUM 8.3* 8.4* 8.6* 8.5* 8.4*  MG  --   --   --   --  1.6*   GFR: Estimated Creatinine Clearance: 110.8 mL/min (by C-G formula based on SCr of 0.94 mg/dL). Liver Function Tests: Recent Labs  Lab 07/02/22 1135  AST 26  ALT 35  ALKPHOS 83  BILITOT 1.8*  PROT 7.3  ALBUMIN 3.0*   Recent Results (  from the past 240 hour(s))  Resp panel by RT-PCR (RSV, Flu A&B, Covid) Anterior Nasal Swab     Status: None   Collection Time: 07/02/22  3:08 PM   Specimen: Anterior Nasal Swab  Result Value Ref Range Status   SARS Coronavirus 2 by RT PCR NEGATIVE NEGATIVE Final    Comment: (NOTE) SARS-CoV-2 target nucleic acids are NOT DETECTED.  The SARS-CoV-2 RNA is generally detectable in upper respiratory specimens during the acute phase of infection. The lowest concentration of SARS-CoV-2 viral copies this assay can detect is 138 copies/mL. A negative result does not preclude SARS-Cov-2 infection and should not be used as the sole basis for treatment or other patient management decisions. A negative result may occur with  improper specimen collection/handling, submission of specimen other than nasopharyngeal swab, presence of viral mutation(s) within the areas targeted by this assay, and inadequate number of viral copies(<138 copies/mL). A negative result must be combined with clinical observations, patient history, and epidemiological information. The expected result is Negative.  Fact Sheet for Patients:  BloggerCourse.com  Fact Sheet for Healthcare Providers:  SeriousBroker.it  This test is no t yet approved or cleared by the [place] FDA and  has been authorized for detection and/or diagnosis of SARS-CoV-2 by FDA under an Emergency Use Authorization (EUA). This EUA will remain  in effect (meaning this test can be used) for the duration of the COVID-19 declaration under Section 564(b)(1) of the Act, 21 U.S.C.section 360bbb-3(b)(1), unless the authorization is terminated  or revoked sooner.       Influenza A by PCR NEGATIVE NEGATIVE Final   Influenza B by PCR NEGATIVE NEGATIVE Final    Comment: (NOTE) The Xpert Xpress SARS-CoV-2/FLU/RSV plus assay is intended as an aid in the diagnosis of influenza from Nasopharyngeal swab specimens and should not be used as a sole basis for treatment. Nasal washings and aspirates are unacceptable for Xpert Xpress SARS-CoV-2/FLU/RSV testing.  Fact Sheet for Patients: BloggerCourse.com  Fact Sheet for Healthcare Providers: SeriousBroker.it  This test is not yet approved or cleared by the [place] FDA and has been authorized for detection and/or diagnosis of SARS-CoV-2 by FDA under an Emergency Use Authorization (EUA). This EUA will remain in effect (meaning this test can be used) for the duration of the COVID-19 declaration under Section 564(b)(1) of the Act, 21 U.S.C. section 360bbb-3(b)(1), unless the authorization is terminated or revoked.     Resp Syncytial Virus by PCR NEGATIVE NEGATIVE Final    Comment: (NOTE) Fact Sheet for Patients: BloggerCourse.com  Fact Sheet for Healthcare Providers: SeriousBroker.it  This test is not yet approved or cleared by the [place] FDA and has been authorized for detection and/or diagnosis of SARS-CoV-2 by FDA under an Emergency Use Authorization (EUA). This EUA will remain in effect (meaning this test can be used) for the duration of the COVID-19 declaration under Section 564(b)(1) of the Act, 21 U.S.C. section  360bbb-3(b)(1), unless the authorization is terminated or revoked.  Performed at [facility], [address] [place], [place] [address]     Radiology Studies: No results found.  Scheduled Meds:  [START ON 07/07/2022] acetaZOLAMIDE  500 mg Intravenous Once   atorvastatin  20 mg Oral Daily   dextromethorphan-guaiFENesin  1 tablet Oral BID   enoxaparin (LOVENOX) injection  130 mg Subcutaneous Q12H   furosemide  80 mg Intravenous Q12H   losartan  12.5 mg Oral Daily   metoprolol succinate  12.5 mg Oral Daily   sodium chloride  flush  3 mL Intravenous Q12H   warfarin  10 mg Oral ONCE-1600   Warfarin - Pharmacist Dosing Inpatient   Does not apply q1600   Continuous Infusions:  sodium chloride      LOS: 4 days   Shon Hale M.D on 07/06/2022 at 3:06 PM  Go to [email or web address] - for contact info  [facility] - Office  [phone]  If 7PM-7AM, please contact night-coverage [email or web address] 07/06/2022, 3:06 PM

## 2022-07-07 ENCOUNTER — Inpatient Hospital Stay (HOSPITAL_COMMUNITY): Payer: Medicare HMO

## 2022-07-07 DIAGNOSIS — J189 Pneumonia, unspecified organism: Secondary | ICD-10-CM | POA: Diagnosis not present

## 2022-07-07 DIAGNOSIS — I5043 Acute on chronic combined systolic (congestive) and diastolic (congestive) heart failure: Secondary | ICD-10-CM | POA: Diagnosis not present

## 2022-07-07 LAB — BASIC METABOLIC PANEL
Anion gap: 8 (ref 5–15)
BUN: 14 mg/dL (ref 6–20)
CO2: 31 mmol/L (ref 22–32)
Calcium: 8.5 mg/dL — ABNORMAL LOW (ref 8.9–10.3)
Chloride: 96 mmol/L — ABNORMAL LOW (ref 98–111)
Creatinine, Ser: 0.93 mg/dL (ref 0.44–1.00)
GFR, Estimated: >60 mL/min (ref >60)
Glucose, Bld: 85 mg/dL (ref 70–99)
Potassium: 4 mmol/L (ref 3.5–5.1)
Sodium: 135 mmol/L (ref 135–145)

## 2022-07-07 LAB — PROTIME-INR
INR: 2.6 — ABNORMAL HIGH (ref 0.8–1.2)
Prothrombin Time: 27.8 seconds — ABNORMAL HIGH (ref 11.4–15.2)

## 2022-07-07 MED ORDER — WARFARIN SODIUM 5 MG PO TABS
5.0000 mg | ORAL_TABLET | Freq: Once | ORAL | Status: AC
  Administered 2022-07-07: 5 mg via ORAL
  Filled 2022-07-07: qty 1

## 2022-07-07 MED ORDER — SODIUM CHLORIDE 0.9 % IV SOLN
2.0000 g | INTRAVENOUS | Status: DC
  Administered 2022-07-07 – 2022-07-08 (×2): 2 g via INTRAVENOUS
  Filled 2022-07-07 (×2): qty 20

## 2022-07-07 MED ORDER — DOXYCYCLINE HYCLATE 100 MG PO TABS
100.0000 mg | ORAL_TABLET | Freq: Two times a day (BID) | ORAL | Status: DC
  Administered 2022-07-07 – 2022-07-09 (×4): 100 mg via ORAL
  Filled 2022-07-07 (×4): qty 1

## 2022-07-07 NOTE — Progress Notes (Signed)
ANTICOAGULATION CONSULT NOTE -   Pharmacy Consult for warfarin + coumadin bridge Indication:  hx PE /DVT  Allergies  Allergen Reactions   Sulfa Antibiotics Itching, Swelling and Rash    Lip swelling    Patient Measurements: Height: 5\' 11"  (180.3 cm) Weight: 123.4 kg (272 lb) IBW/kg (Calculated) : 70.8  Labs: Recent Labs    07/05/22 0520 07/06/22 0415 07/07/22 0514  HGB 10.5*  --   --   HCT 35.3*  --   --   PLT 206  --   --   LABPROT 20.4* 23.9* 27.8*  INR 1.7* 2.1* 2.6*  CREATININE 0.96 0.94 0.93    Estimated Creatinine Clearance: 110.7 mL/min (by C-G formula based on SCr of 0.93 mg/dL).  Assessment: Pharmacy consulted to dose warfarin in patient with history of pulmonary embolism.  INR on admission is subtherapeutic at 1.4. Home dose listed as 5 mg on Sun + Thurs and 10 mg ROW.  INR 1.5> 1.4>1.7>2.1 Now therapeutic, off lovenox. With rising INR, will reduce dose as per home regimen  Goal of Therapy:  INR 2-3 Monitor platelets by anticoagulation protocol: Yes   Plan:  Warfarin 5mg  po x1 Monitor daily INR and s/s of bleeding.  Caryl Asp, PharmD Clinical Pharmacist 07/07/2022 8:26 AM

## 2022-07-07 NOTE — Progress Notes (Signed)
PROGRESS NOTE     [name] [name], is a 47 y.o. female, DOB - 06-Jul-1975, ZOX:[id number]  Admit date - 07/02/2022   Admitting Physician [name] Fallen, MD  Outpatient Primary MD for the patient is [name] Spar, MD  LOS - 5  Chief Complaint  Patient presents with   Chest Pain        Brief Narrative:  47 y.o. female with medical history significant of heart failure with reduced ejection fraction (last echo February 2024 EF 30 to 35%), nonsustained ventricular tachycardia, CAD, hypertension, history of PE/DVT, pulmonary hypertension, severe mitral regurgitation, obesity admitted on 07/02/2022 with acute on chronic combined diastolic and systolic CHF exacerbation    -Assessment and Plan: 1)Acute on chronic combined systolic and diastolic dysfunction/exacerbation -Repeat echo from 07/03/2022 with EF down to 25-30% (Prior EF was 30 to 35% in February 2024) with regional wall motion abnormalities (The entire lateral wall, entire inferior wall, apical anterior segment  and apex are akinetic) , severely dilated left ventricular internal cavity size, moderate pulmonary hypertension -Echo also shows grade 3 diastolic dysfunction CHF and RV pressure overload -Moderate to severe MR -No aortic stenosis -Patient had right heart cath on 03/08/2022, cards recommended possible mitral valve replacement at the time -Continue IV Lasix 80 mg every 12, metoprolol , losartan and Aldactone -Daily weight fluid input and output monitoring weight and REDs Vest -Cardiology consult appreciated -Soft BPs limiting titration of diuretics -May need to be transferred to [facility] for inotropes to support diuresis if BP is too soft to tolerate bolus Lasix -Baseline weight 252 to 255 pounds-- -at the time of admission this time around weight was almost 30 pounds up 07/07/22 -Negative fluid balance- -Dyspnea and hypoxia improving -Weaned off oxygen as of 07/05/2022 -Continue IV diuresis -Weight on  admission was 288 currently down to 272 -Repeat chest x-ray suggest right sided pneumonia and CHF  2) history of DVT/PE--- prior history of unprovoked saddle PE  continue chronic anticoagulation with Coumadin -INR is therapeutic,  - stopped Lovenox bridge as of 07/06/2022  3)Obesity- -Low calorie diet, portion control and increase physical activity discussed with patient -Body mass index is 37.94 kg/m.  4)Valvular heart disease--moderate to severe MR--see #1 above -Patient is to follow-up as outpatient with structural heart team for possible mitral valve repair  5) acute hypoxic respiratory failure--- due to 1 above -Hypoxia resolved with diuresis  6)Hyperkalemia--Lokelma as ordered  Status is: Inpatient   Disposition: The patient is from: Home              Anticipated d/c is to: Home              Anticipated d/c date is: 3 days              Patient currently is not medically stable to d/c. Barriers: Not Clinically Stable-   Code Status :  -  Code Status: Full Code   Family Communication:    NA (patient is alert, awake and coherent)  -Discussed with her boyfriend at bedside  DVT Prophylaxis  :   - SCDs /Coumadin     Lab Results  Component Value Date   PLT 206 07/05/2022    Inpatient Medications  Scheduled Meds:  atorvastatin  20 mg Oral Daily   dextromethorphan-guaiFENesin  1 tablet Oral BID   doxycycline  100 mg Oral Q12H   furosemide  80 mg Intravenous Q12H   losartan  12.5 mg Oral Daily   metoprolol succinate  12.5 mg  Oral Daily   sodium chloride flush  3 mL Intravenous Q12H   Warfarin - Pharmacist Dosing Inpatient   Does not apply q1600   Continuous Infusions:  sodium chloride     cefTRIAXone (ROCEPHIN)  IV     PRN Meds:.sodium chloride, acetaminophen, benzonatate, ondansetron (ZOFRAN) IV, sodium chloride flush   Anti-infectives (From admission, onward)    Start     Dose/Rate Route Frequency Ordered Stop   07/07/22 2200  doxycycline (VIBRA-TABS)  tablet 100 mg        100 mg Oral Every 12 hours 07/07/22 1712 07/12/22 2159   07/07/22 1800  cefTRIAXone (ROCEPHIN) 2 g in sodium chloride 0.9 % 100 mL IVPB        2 g 200 mL/hr over 30 Minutes Intravenous Every 24 hours 07/07/22 1712 07/12/22 1759   07/03/22 1000  azithromycin (ZITHROMAX) tablet 250 mg        250 mg Oral Daily 07/02/22 1728 07/06/22 0830   07/02/22 1345  cefTRIAXone (ROCEPHIN) 1 g in sodium chloride 0.9 % 100 mL IVPB        1 g 200 mL/hr over 30 Minutes Intravenous  Once 07/02/22 1344 07/02/22 1523   07/02/22 1345  azithromycin (ZITHROMAX) 500 mg in sodium chloride 0.9 % 250 mL IVPB        500 mg 250 mL/hr over 60 Minutes Intravenous  Once 07/02/22 1344 07/02/22 1710        Subjective: [name] [name] today has no fevers, no emesis,  No chest pain,   -Hypoxia has resolved, -Boyfriend at bedside, questions answered -Voiding well,  Objective: Vitals:   07/07/22 0500 07/07/22 0508 07/07/22 1023 07/07/22 1400  BP:  (!) 124/92 106/66 107/67  Pulse:  74 62 63  Resp:  18    Temp:  98.3 F (36.8 C)  97.7 F (36.5 C)  TempSrc:  Oral  Oral  SpO2:  98% 100% 100%  Weight: 123.4 kg     Height:        Intake/Output Summary (Last 24 hours) at 07/07/2022 1713 Last data filed at 07/07/2022 1627 Gross per 24 hour  Intake 240 ml  Output 4450 ml  Net -4210 ml   Filed Weights   07/05/22 0452 07/06/22 0416 07/07/22 0500  Weight: 127.7 kg 126.1 kg 123.4 kg    Physical Exam  Gen:- Awake Alert, obese no conversational dyspnea but has dyspnea on exertion HEENT:- [place].AT, No sclera icterus Neck-Supple Neck, +ve JVD,.  Lungs-improving air movement, no wheezing CV- S1, S2 normal, regular  Abd-  +ve B.Sounds, Abd Soft, No tenderness, increased truncal adiposity Extremity/Skin:-Improving edema, pedal pulses present  Psych-affect is appropriate, oriented x3 Neuro-generalized weakness, no new focal deficits, no tremors  Data Reviewed: I have personally reviewed following labs  and imaging studies  CBC: Recent Labs  Lab 07/02/22 1100 07/04/22 0434 07/05/22 0520  WBC 6.0 6.3 4.9  HGB 11.2* 10.5* 10.5*  HCT 37.2 36.6 35.3*  MCV 82.5 85.1 83.5  PLT 157 196 206   Basic Metabolic Panel: Recent Labs  Lab 07/03/22 0417 07/04/22 0434 07/05/22 0520 07/06/22 0415 07/07/22 0514  NA 137 135 134* 134* 135  K 4.6 4.8 5.3* 4.0 4.0  CL 101 99 97* 96* 96*  CO2 29 28 28 29 31   GLUCOSE 85 82 87 84 85  BUN 15 19 20 17 14   CREATININE 0.88 1.01* 0.96 0.94 0.93  CALCIUM 8.4* 8.6* 8.5* 8.4* 8.5*  MG  --   --   --  1.6*  --    GFR: Estimated Creatinine Clearance: 110.7 mL/min (by C-G formula based on SCr of 0.93 mg/dL). Liver Function Tests: Recent Labs  Lab 07/02/22 1135  AST 26  ALT 35  ALKPHOS 83  BILITOT 1.8*  PROT 7.3  ALBUMIN 3.0*   Recent Results (from the past 240 hour(s))  Resp panel by RT-PCR (RSV, Flu A&B, Covid) Anterior Nasal Swab     Status: None   Collection Time: 07/02/22  3:08 PM   Specimen: Anterior Nasal Swab  Result Value Ref Range Status   SARS Coronavirus 2 by RT PCR NEGATIVE NEGATIVE Final    Comment: (NOTE) SARS-CoV-2 target nucleic acids are NOT DETECTED.  The SARS-CoV-2 RNA is generally detectable in upper respiratory specimens during the acute phase of infection. The lowest concentration of SARS-CoV-2 viral copies this assay can detect is 138 copies/mL. A negative result does not preclude SARS-Cov-2 infection and should not be used as the sole basis for treatment or other patient management decisions. A negative result may occur with  improper specimen collection/handling, submission of specimen other than nasopharyngeal swab, presence of viral mutation(s) within the areas targeted by this assay, and inadequate number of viral copies(<138 copies/mL). A negative result must be combined with clinical observations, patient history, and epidemiological information. The expected result is Negative.  Fact Sheet for Patients:   BloggerCourse.com  Fact Sheet for Healthcare Providers:  SeriousBroker.it  This test is no t yet approved or cleared by the [place] FDA and  has been authorized for detection and/or diagnosis of SARS-CoV-2 by FDA under an Emergency Use Authorization (EUA). This EUA will remain  in effect (meaning this test can be used) for the duration of the COVID-19 declaration under Section 564(b)(1) of the Act, 21 U.S.C.section 360bbb-3(b)(1), unless the authorization is terminated  or revoked sooner.       Influenza A by PCR NEGATIVE NEGATIVE Final   Influenza B by PCR NEGATIVE NEGATIVE Final    Comment: (NOTE) The Xpert Xpress SARS-CoV-2/FLU/RSV plus assay is intended as an aid in the diagnosis of influenza from Nasopharyngeal swab specimens and should not be used as a sole basis for treatment. Nasal washings and aspirates are unacceptable for Xpert Xpress SARS-CoV-2/FLU/RSV testing.  Fact Sheet for Patients: BloggerCourse.com  Fact Sheet for Healthcare Providers: SeriousBroker.it  This test is not yet approved or cleared by the [place] FDA and has been authorized for detection and/or diagnosis of SARS-CoV-2 by FDA under an Emergency Use Authorization (EUA). This EUA will remain in effect (meaning this test can be used) for the duration of the COVID-19 declaration under Section 564(b)(1) of the Act, 21 U.S.C. section 360bbb-3(b)(1), unless the authorization is terminated or revoked.     Resp Syncytial Virus by PCR NEGATIVE NEGATIVE Final    Comment: (NOTE) Fact Sheet for Patients: BloggerCourse.com  Fact Sheet for Healthcare Providers: SeriousBroker.it  This test is not yet approved or cleared by the [place] FDA and has been authorized for detection and/or diagnosis of SARS-CoV-2 by FDA under an Emergency Use  Authorization (EUA). This EUA will remain in effect (meaning this test can be used) for the duration of the COVID-19 declaration under Section 564(b)(1) of the Act, 21 U.S.C. section 360bbb-3(b)(1), unless the authorization is terminated or revoked.  Performed at [facility], [address], [place], [place] [address]     Radiology Studies: DG Chest 2 View  Result Date: 07/07/2022 CLINICAL DATA:  [id number] Dyspnea and respiratory abnormalities [id number] foot EXAM:  CHEST - 2 VIEW COMPARISON:  07/02/2022 FINDINGS: Stable cardiomegaly. Mild vascular congestion. Persistent airspace opacity within the right upper lobe, minimally improving. No new focal consolidation. No pleural effusion or pneumothorax. IMPRESSION: 1. Persistent airspace opacity within the right upper lobe, minimally improving. 2. Stable cardiomegaly with mild vascular congestion. Electronically Signed   By: Duanne Guess D.O.   On: 07/07/2022 12:21    Scheduled Meds:  atorvastatin  20 mg Oral Daily   dextromethorphan-guaiFENesin  1 tablet Oral BID   doxycycline  100 mg Oral Q12H   furosemide  80 mg Intravenous Q12H   losartan  12.5 mg Oral Daily   metoprolol succinate  12.5 mg Oral Daily   sodium chloride flush  3 mL Intravenous Q12H   Warfarin - Pharmacist Dosing Inpatient   Does not apply q1600   Continuous Infusions:  sodium chloride     cefTRIAXone (ROCEPHIN)  IV      LOS: 5 days   Shon Hale M.D on 07/07/2022 at 5:13 PM  Go to [email or web address] - for contact info  [facility] - Office  [phone]  If 7PM-7AM, please contact night-coverage [email or web address] 07/07/2022, 5:13 PM

## 2022-07-07 NOTE — Plan of Care (Signed)
  Problem: Education: Goal: Knowledge of General Education information will improve Description: Including pain rating scale, medication(s)/side effects and non-pharmacologic comfort measures Outcome: Progressing   Problem: Health Behavior/Discharge Planning: Goal: Ability to manage health-related needs will improve Outcome: Progressing   Problem: Clinical Measurements: Goal: Ability to maintain clinical measurements within normal limits will improve Outcome: Progressing Goal: Will remain free from infection Outcome: Progressing Goal: Diagnostic test results will improve Outcome: Progressing Goal: Respiratory complications will improve Outcome: Progressing Goal: Cardiovascular complication will be avoided Outcome: Progressing   Problem: Activity: Goal: Risk for activity intolerance will decrease Outcome: Progressing   Problem: Nutrition: Goal: Adequate nutrition will be maintained Outcome: Progressing   Problem: Coping: Goal: Level of anxiety will decrease Outcome: Progressing   Problem: Elimination: Goal: Will not experience complications related to bowel motility Outcome: Progressing Goal: Will not experience complications related to urinary retention Outcome: Progressing   Problem: Pain Managment: Goal: General experience of comfort will improve Outcome: Progressing   Problem: Safety: Goal: Ability to remain free from injury will improve Outcome: Progressing   Problem: Skin Integrity: Goal: Risk for impaired skin integrity will decrease Outcome: Progressing   Problem: Education: Goal: Knowledge of General Education information will improve Description: Including pain rating scale, medication(s)/side effects and non-pharmacologic comfort measures Outcome: Progressing   Problem: Health Behavior/Discharge Planning: Goal: Ability to manage health-related needs will improve Outcome: Progressing   Problem: Clinical Measurements: Goal: Ability to maintain  clinical measurements within normal limits will improve Outcome: Progressing Goal: Will remain free from infection Outcome: Progressing Goal: Diagnostic test results will improve Outcome: Progressing Goal: Respiratory complications will improve Outcome: Progressing Goal: Cardiovascular complication will be avoided Outcome: Progressing   Problem: Activity: Goal: Risk for activity intolerance will decrease Outcome: Progressing   Problem: Nutrition: Goal: Adequate nutrition will be maintained Outcome: Progressing   Problem: Elimination: Goal: Will not experience complications related to bowel motility Outcome: Progressing Goal: Will not experience complications related to urinary retention Outcome: Progressing   Problem: Pain Managment: Goal: General experience of comfort will improve Outcome: Progressing   Problem: Safety: Goal: Ability to remain free from injury will improve Outcome: Progressing   Problem: Skin Integrity: Goal: Risk for impaired skin integrity will decrease Outcome: Progressing   Problem: Activity: Goal: Capacity to carry out activities will improve Outcome: Progressing   Problem: Cardiac: Goal: Ability to achieve and maintain adequate cardiopulmonary perfusion will improve Outcome: Progressing

## 2022-07-08 DIAGNOSIS — Z7901 Long term (current) use of anticoagulants: Secondary | ICD-10-CM | POA: Diagnosis not present

## 2022-07-08 DIAGNOSIS — I5043 Acute on chronic combined systolic (congestive) and diastolic (congestive) heart failure: Secondary | ICD-10-CM | POA: Diagnosis not present

## 2022-07-08 DIAGNOSIS — I5020 Unspecified systolic (congestive) heart failure: Secondary | ICD-10-CM | POA: Diagnosis not present

## 2022-07-08 LAB — PROTIME-INR
INR: 2.7 — ABNORMAL HIGH (ref 0.8–1.2)
Prothrombin Time: 28.6 seconds — ABNORMAL HIGH (ref 11.4–15.2)

## 2022-07-08 LAB — CBC
HCT: 35.1 % — ABNORMAL LOW (ref 36.0–46.0)
Hemoglobin: 10.5 g/dL — ABNORMAL LOW (ref 12.0–15.0)
MCH: 24.8 pg — ABNORMAL LOW (ref 26.0–34.0)
MCHC: 29.9 g/dL — ABNORMAL LOW (ref 30.0–36.0)
MCV: 82.8 fL (ref 80.0–100.0)
Platelets: 236 10*3/uL (ref 150–400)
RBC: 4.24 MIL/uL (ref 3.87–5.11)
RDW: 21.7 % — ABNORMAL HIGH (ref 11.5–15.5)
WBC: 3.4 10*3/uL — ABNORMAL LOW (ref 4.0–10.5)
nRBC: 0 % (ref 0.0–0.2)

## 2022-07-08 MED ORDER — WARFARIN SODIUM 5 MG PO TABS
5.0000 mg | ORAL_TABLET | Freq: Once | ORAL | Status: AC
  Administered 2022-07-08: 5 mg via ORAL
  Filled 2022-07-08: qty 1

## 2022-07-08 NOTE — Care Management Important Message (Signed)
Important Message  Patient Details  Name: [name] [name] MRN: [id number] Date of Birth: 1975-06-11   Medicare Important Message Given:  Yes     Corey Harold 07/08/2022, 12:28 PM

## 2022-07-08 NOTE — Progress Notes (Signed)
PROGRESS NOTE     [name] [name], is a 47 y.o. female, DOB - 05/10/1975, ZOX:[id number]  Admit date - 07/02/2022   Admitting Physician Azucena Fallen, MD  Outpatient Primary MD for the patient is Benetta Spar, MD  LOS - 6  Chief Complaint  Patient presents with   Chest Pain        Brief Narrative:  47 y.o. female with medical history significant of heart failure with reduced ejection fraction (last echo February 2024 EF 30 to 35%), nonsustained ventricular tachycardia, CAD, hypertension, history of PE/DVT, pulmonary hypertension, severe mitral regurgitation, obesity admitted on 07/02/2022 with acute on chronic combined diastolic and systolic CHF exacerbation    -Assessment and Plan: 1)Acute on chronic combined systolic and diastolic dysfunction/exacerbation -Repeat echo from 07/03/2022 with EF down to 25-30% (Prior EF was 30 to 35% in February 2024) with regional wall motion abnormalities (The entire lateral wall, entire inferior wall, apical anterior segment  and apex are akinetic) , severely dilated left ventricular internal cavity size, moderate pulmonary hypertension -Echo also shows grade 3 diastolic dysfunction CHF and RV pressure overload -Moderate to severe MR -No aortic stenosis -Patient had right heart cath on 03/08/2022, cards recommended possible mitral valve replacement at the time -Continue IV Lasix 80 mg every 12, metoprolol , losartan and Aldactone -Daily weight fluid input and output monitoring weight and REDs Vest -Cardiology consult appreciated -Soft BPs limiting titration of diuretics -May need to be transferred to [facility] for inotropes to support diuresis if BP is too soft to tolerate bolus Lasix -Baseline weight 252 to 255 pounds-- -at the time of admission this time around weight was almost 30 pounds up 07/08/22 -Negative fluid balance- -Dyspnea and hypoxia improving -Weaned off oxygen as of 07/05/2022 -Continue IV diuresis -Weight on  admission was 288 currently down to 268 -Repeat chest x-ray on 07/07/22 suggest right sided pneumonia and CHF  2) history of DVT/PE--- prior history of unprovoked saddle PE  continue chronic anticoagulation with Coumadin -INR is therapeutic,  - stopped Lovenox bridge as of 07/06/2022  3)Obesity- -Low calorie diet, portion control and increase physical activity discussed with patient -Body mass index is 37.5 kg/m.  4)Valvular heart disease--moderate to severe MR--see #1 above -Patient is to follow-up as outpatient with structural heart team for possible mitral valve repair  5) acute hypoxic respiratory failure--- due to 1 above -Hypoxia resolved with diuresis  6)Hyperkalemia--resolved with Lokelma   Status is: Inpatient   Disposition: The patient is from: Home              Anticipated d/c is to: Home              Anticipated d/c date is: 1 day              Patient currently is not medically stable to d/c. Barriers: Not Clinically Stable-   Code Status :  -  Code Status: Full Code   Family Communication:    NA (patient is alert, awake and coherent)  -Discussed with her boyfriend at bedside  DVT Prophylaxis  :   - SCDs /Coumadin     Lab Results  Component Value Date   PLT 236 07/08/2022    Inpatient Medications  Scheduled Meds:  atorvastatin  20 mg Oral Daily   dextromethorphan-guaiFENesin  1 tablet Oral BID   doxycycline  100 mg Oral Q12H   furosemide  80 mg Intravenous Q12H   losartan  12.5 mg Oral Daily   metoprolol succinate  12.5 mg Oral Daily   sodium chloride flush  3 mL Intravenous Q12H   Warfarin - Pharmacist Dosing Inpatient   Does not apply q1600   Continuous Infusions:  sodium chloride     cefTRIAXone (ROCEPHIN)  IV 2 g (07/08/22 1736)   PRN Meds:.sodium chloride, acetaminophen, benzonatate, ondansetron (ZOFRAN) IV, sodium chloride flush   Anti-infectives (From admission, onward)    Start     Dose/Rate Route Frequency Ordered Stop   07/07/22 2200   doxycycline (VIBRA-TABS) tablet 100 mg        100 mg Oral Every 12 hours 07/07/22 1712 07/12/22 2159   07/07/22 1800  cefTRIAXone (ROCEPHIN) 2 g in sodium chloride 0.9 % 100 mL IVPB        2 g 200 mL/hr over 30 Minutes Intravenous Every 24 hours 07/07/22 1712 07/12/22 1759   07/03/22 1000  azithromycin (ZITHROMAX) tablet 250 mg        250 mg Oral Daily 07/02/22 1728 07/06/22 0830   07/02/22 1345  cefTRIAXone (ROCEPHIN) 1 g in sodium chloride 0.9 % 100 mL IVPB        1 g 200 mL/hr over 30 Minutes Intravenous  Once 07/02/22 1344 07/02/22 1523   07/02/22 1345  azithromycin (ZITHROMAX) 500 mg in sodium chloride 0.9 % 250 mL IVPB        500 mg 250 mL/hr over 60 Minutes Intravenous  Once 07/02/22 1344 07/02/22 1710        Subjective: [name] [name] today has no fevers, no emesis,  No chest pain,   - Dyspnea on exertion continues to improve -Continues to void well  Objective: Vitals:   07/07/22 2152 07/08/22 0533 07/08/22 0853 07/08/22 1100  BP: 112/88 109/78 101/75   Pulse: 65 62    Resp:  20 (!) 26 (!) 21  Temp: 97.8 F (36.6 C) 97.8 F (36.6 C)    TempSrc: Oral Oral    SpO2: 100% 100%    Weight:  122 kg    Height:        Intake/Output Summary (Last 24 hours) at 07/08/2022 1900 Last data filed at 07/08/2022 1142 Gross per 24 hour  Intake 240 ml  Output 4400 ml  Net -4160 ml   Filed Weights   07/06/22 0416 07/07/22 0500 07/08/22 0533  Weight: 126.1 kg 123.4 kg 122 kg    Physical Exam  Gen:- Awake Alert, obese no conversational dyspnea but has dyspnea on exertion HEENT:- [place].AT, No sclera icterus Neck-Supple Neck, +ve JVD,.  Lungs-improving air movement, no wheezing CV- S1, S2 normal, regular  Abd-  +ve B.Sounds, Abd Soft, No tenderness, increased truncal adiposity Extremity/Skin:-Improving edema, pedal pulses present  Psych-affect is appropriate, oriented x3 Neuro-generalized weakness, no new focal deficits, no tremors  Data Reviewed: I have personally reviewed  following labs and imaging studies  CBC: Recent Labs  Lab 07/02/22 1100 07/04/22 0434 07/05/22 0520 07/08/22 0529  WBC 6.0 6.3 4.9 3.4*  HGB 11.2* 10.5* 10.5* 10.5*  HCT 37.2 36.6 35.3* 35.1*  MCV 82.5 85.1 83.5 82.8  PLT 157 196 206 236   Basic Metabolic Panel: Recent Labs  Lab 07/03/22 0417 07/04/22 0434 07/05/22 0520 07/06/22 0415 07/07/22 0514  NA 137 135 134* 134* 135  K 4.6 4.8 5.3* 4.0 4.0  CL 101 99 97* 96* 96*  CO2 29 28 28 29 31   GLUCOSE 85 82 87 84 85  BUN 15 19 20 17 14   CREATININE 0.88 1.01* 0.96 0.94 0.93  CALCIUM 8.4* 8.6*  8.5* 8.4* 8.5*  MG  --   --   --  1.6*  --    GFR: Estimated Creatinine Clearance: 110.1 mL/min (by C-G formula based on SCr of 0.93 mg/dL). Liver Function Tests: Recent Labs  Lab 07/02/22 1135  AST 26  ALT 35  ALKPHOS 83  BILITOT 1.8*  PROT 7.3  ALBUMIN 3.0*   Recent Results (from the past 240 hour(s))  Resp panel by RT-PCR (RSV, Flu A&B, Covid) Anterior Nasal Swab     Status: None   Collection Time: 07/02/22  3:08 PM   Specimen: Anterior Nasal Swab  Result Value Ref Range Status   SARS Coronavirus 2 by RT PCR NEGATIVE NEGATIVE Final    Comment: (NOTE) SARS-CoV-2 target nucleic acids are NOT DETECTED.  The SARS-CoV-2 RNA is generally detectable in upper respiratory specimens during the acute phase of infection. The lowest concentration of SARS-CoV-2 viral copies this assay can detect is 138 copies/mL. A negative result does not preclude SARS-Cov-2 infection and should not be used as the sole basis for treatment or other patient management decisions. A negative result may occur with  improper specimen collection/handling, submission of specimen other than nasopharyngeal swab, presence of viral mutation(s) within the areas targeted by this assay, and inadequate number of viral copies(<138 copies/mL). A negative result must be combined with clinical observations, patient history, and epidemiological information. The  expected result is Negative.  Fact Sheet for Patients:  BloggerCourse.com  Fact Sheet for Healthcare Providers:  SeriousBroker.it  This test is no t yet approved or cleared by the [place] FDA and  has been authorized for detection and/or diagnosis of SARS-CoV-2 by FDA under an Emergency Use Authorization (EUA). This EUA will remain  in effect (meaning this test can be used) for the duration of the COVID-19 declaration under Section 564(b)(1) of the Act, 21 U.S.C.section 360bbb-3(b)(1), unless the authorization is terminated  or revoked sooner.       Influenza A by PCR NEGATIVE NEGATIVE Final   Influenza B by PCR NEGATIVE NEGATIVE Final    Comment: (NOTE) The Xpert Xpress SARS-CoV-2/FLU/RSV plus assay is intended as an aid in the diagnosis of influenza from Nasopharyngeal swab specimens and should not be used as a sole basis for treatment. Nasal washings and aspirates are unacceptable for Xpert Xpress SARS-CoV-2/FLU/RSV testing.  Fact Sheet for Patients: BloggerCourse.com  Fact Sheet for Healthcare Providers: SeriousBroker.it  This test is not yet approved or cleared by the [place] FDA and has been authorized for detection and/or diagnosis of SARS-CoV-2 by FDA under an Emergency Use Authorization (EUA). This EUA will remain in effect (meaning this test can be used) for the duration of the COVID-19 declaration under Section 564(b)(1) of the Act, 21 U.S.C. section 360bbb-3(b)(1), unless the authorization is terminated or revoked.     Resp Syncytial Virus by PCR NEGATIVE NEGATIVE Final    Comment: (NOTE) Fact Sheet for Patients: BloggerCourse.com  Fact Sheet for Healthcare Providers: SeriousBroker.it  This test is not yet approved or cleared by the [place] FDA and has been authorized for detection and/or  diagnosis of SARS-CoV-2 by FDA under an Emergency Use Authorization (EUA). This EUA will remain in effect (meaning this test can be used) for the duration of the COVID-19 declaration under Section 564(b)(1) of the Act, 21 U.S.C. section 360bbb-3(b)(1), unless the authorization is terminated or revoked.  Performed at [facility], [address], [place], [place] [address]     Radiology Studies: DG Chest 2 View  Result Date: 07/07/2022 CLINICAL DATA:  [id number] Dyspnea and respiratory abnormalities [id number] foot EXAM: CHEST - 2 VIEW COMPARISON:  07/02/2022 FINDINGS: Stable cardiomegaly. Mild vascular congestion. Persistent airspace opacity within the right upper lobe, minimally improving. No new focal consolidation. No pleural effusion or pneumothorax. IMPRESSION: 1. Persistent airspace opacity within the right upper lobe, minimally improving. 2. Stable cardiomegaly with mild vascular congestion. Electronically Signed   By: Duanne Guess D.O.   On: 07/07/2022 12:21    Scheduled Meds:  atorvastatin  20 mg Oral Daily   dextromethorphan-guaiFENesin  1 tablet Oral BID   doxycycline  100 mg Oral Q12H   furosemide  80 mg Intravenous Q12H   losartan  12.5 mg Oral Daily   metoprolol succinate  12.5 mg Oral Daily   sodium chloride flush  3 mL Intravenous Q12H   Warfarin - Pharmacist Dosing Inpatient   Does not apply q1600   Continuous Infusions:  sodium chloride     cefTRIAXone (ROCEPHIN)  IV 2 g (07/08/22 1736)    LOS: 6 days   Shon Hale M.D on 07/08/2022 at 7:00 PM  Go to [email or web address] - for contact info  [facility] - Office  [phone]  If 7PM-7AM, please contact night-coverage [email or web address] 07/08/2022, 7:00 PM

## 2022-07-08 NOTE — Progress Notes (Signed)
ANTICOAGULATION CONSULT NOTE  Pharmacy Consult for warfarin  Indication:  hx PE /DVT  Allergies  Allergen Reactions   Sulfa Antibiotics Itching, Swelling and Rash    Lip swelling    Patient Measurements: Height: 5\' 11"  (180.3 cm) Weight: 122 kg (268 lb 14.4 oz) IBW/kg (Calculated) : 70.8  Labs: Recent Labs    07/06/22 0415 07/07/22 0514 07/08/22 0529  HGB  --   --  10.5*  HCT  --   --  35.1*  PLT  --   --  236  LABPROT 23.9* 27.8* 28.6*  INR 2.1* 2.6* 2.7*  CREATININE 0.94 0.93  --     Estimated Creatinine Clearance: 110.1 mL/min (by C-G formula based on SCr of 0.93 mg/dL).  Assessment: Pharmacy consulted to dose warfarin in patient with history of pulmonary embolism.  INR on admission is subtherapeutic at 1.4.  Home dose listed as 5 mg on Sun + Thurs and 10 mg ROW.  INR continues to trend up quickly to 2.7, although the trend is slowing. Now therapeutic, off lovenox. CBC stable, no bleeding issues noted.   Goal of Therapy:  INR 2-3 Monitor platelets by anticoagulation protocol: Yes   Plan:  Warfarin 5mg  po x1 Monitor daily INR and s/s of bleeding.  Sheppard Coil PharmD., BCPS Clinical Pharmacist 07/08/2022 7:51 AM

## 2022-07-08 NOTE — Progress Notes (Addendum)
Patient Name: [name] [name] Date of Encounter: 07/08/2022 [facility] Cardiologist: Dina Rich, MD    Interval Summary  .     [name] [name] is a 47 y.o. female with a hx of HFrEF (EF 45-50% in 05/2021 with cath showing 40-50% proximal RCA stenosis but otherwise normal cors, EF at 30-35% in 08/2021 and 09/2021, 35-40% in 01/2022 and 30 to 35% in 02/2022), moderate to severe MR (severe by TEE in 02/2022), history of recurrent PE/DVT, HTN, NSVT and OSA (on CPAP) who is being seen 07/04/2022 for the evaluation of CHF at the request of Dr. Mariea Clonts.  Patient feeling much better. Weight down15 lbs.  Vital Signs .    Vitals:   07/07/22 1400 07/07/22 2152 07/08/22 0533 07/08/22 0853  BP: 107/67 112/88 109/78 101/75  Pulse: 63 65 62   Resp:   20 (!) 26  Temp: 97.7 F (36.5 C) 97.8 F (36.6 C) 97.8 F (36.6 C)   TempSrc: Oral Oral Oral   SpO2: 100% 100% 100%   Weight:   122 kg   Height:        Intake/Output Summary (Last 24 hours) at 07/08/2022 1039 Last data filed at 07/08/2022 0935 Gross per 24 hour  Intake 240 ml  Output 3800 ml  Net -3560 ml      07/08/2022    5:33 AM 07/07/2022    5:00 AM 07/06/2022    4:16 AM  Last 3 Weights  Weight (lbs) 268 lb 14.4 oz 272 lb 278 lb 1.6 oz  Weight (kg) 121.972 kg 123.378 kg 126.145 kg      Telemetry/ECG    NSR with PVC's - Personally Reviewed  Physical Exam .   GEN: No acute distress.   Neck: No JVD Cardiac:  RRR, no murmurs, rubs, or gallops.  Respiratory: Clear to auscultation bilaterally. GI: Soft, nontender, non-distended  MS: No edema  Assessment & Plan .    1.Acute on chronic HFrEF - 02/2022 echo: LVEF 30-35%, mod RV dysfunction, severe pulm HTN, severe MR 02/2022 RHC: mean PA 43, PCWP 27, CI 2.15, PVR 3 WU 05/2021 LHC: 50% RCA otherwise normal 02/2022 TEE LVEF 35%, severe funcational MR 06/2022 echo: LVEF 25-30%, grade III dd, mild RV dysfunction, mod to severe functional MR cMRI ordered by has not  scheduled.  - noncompliant with HF clinic f/u, missed structural heart evaluation twice.  - Up 30 lbs from 04/2022 clinic visit on admission and now down 15 lbs but appears euvolemic.   - negative 4.3L yesterday, neg 10.7L since admission.  . She is on IV lasix 80mg  bid, Crt stable. Would transition to oral asix,   - admit weight 288, at 04/2022 appt 253 lbs.? Accuracy. 268 lbs today and appears close to euvolemia - reds vest low quality - soft bp's limit medical therapy. toprol flowered 25mg  to 12.5mg , continue losartan 12.5mg . Continue to hold aldactone for now. [place] Pepper on hold in case NPO needed this admission at some point.        2.Severe functional MR - no showed for 2 appointment with structural heart team to consider TEER -she says her daughter was sick and she couldn't make it but plans to f/u with Dr. Excell Seltzer. Will send Karsten Fells a message to reschedule patient     3. History of DVT/PE - she is on coumadin, her hematolgist has favored coumadin over DOACs for her historically - INR subtherapeutic on admission, now 2.7. History of unprovoked saddle PE in 2015,  For questions or updates, please contact [facility] Please consult [email or web address] for contact info under        Signed, Jacolyn Reedy, PA-C   Patient seen and examined   I agree with findings as noted by [name] [name] above  Pt a 47 yo iwht HFmrEF, severe MR, mild CAD, Hx DVT/PE  (on chronic anticoagulation)   Admitted with increased SOB, LE edema Pt admits to eating more watermelon lately    Since admit she has diuresed with IV lasix and is breathing much better    Near baseline   ON exam   NEck  JVP is not elevated  Cardiac exam   RRR  Gr II/VI systolic murmur   Abd   No hepatomegaly  Ext are without edema  I agree with switching to po lasix   Follow I/O    DIscussed diet, daily weights  Watch all fluid intake better      Will reassess in AM  Dietrich Pates MD   IF does OK then d/c tomorrow    WIll make sure has

## 2022-07-09 ENCOUNTER — Other Ambulatory Visit: Payer: Self-pay | Admitting: *Deleted

## 2022-07-09 ENCOUNTER — Encounter (HOSPITAL_COMMUNITY): Payer: Medicare HMO

## 2022-07-09 DIAGNOSIS — I5023 Acute on chronic systolic (congestive) heart failure: Secondary | ICD-10-CM

## 2022-07-09 DIAGNOSIS — I5043 Acute on chronic combined systolic (congestive) and diastolic (congestive) heart failure: Secondary | ICD-10-CM | POA: Diagnosis not present

## 2022-07-09 DIAGNOSIS — I5020 Unspecified systolic (congestive) heart failure: Secondary | ICD-10-CM | POA: Diagnosis not present

## 2022-07-09 DIAGNOSIS — Z7901 Long term (current) use of anticoagulants: Secondary | ICD-10-CM | POA: Diagnosis not present

## 2022-07-09 LAB — RENAL FUNCTION PANEL
Albumin: 2.9 g/dL — ABNORMAL LOW (ref 3.5–5.0)
Anion gap: 7 (ref 5–15)
BUN: 20 mg/dL (ref 6–20)
CO2: 30 mmol/L (ref 22–32)
Calcium: 8.7 mg/dL — ABNORMAL LOW (ref 8.9–10.3)
Chloride: 98 mmol/L (ref 98–111)
Creatinine, Ser: 1.1 mg/dL — ABNORMAL HIGH (ref 0.44–1.00)
GFR, Estimated: >60 mL/min (ref >60)
Glucose, Bld: 84 mg/dL (ref 70–99)
Phosphorus: 4 mg/dL (ref 2.5–4.6)
Potassium: 4.1 mmol/L (ref 3.5–5.1)
Sodium: 135 mmol/L (ref 135–145)

## 2022-07-09 LAB — PROTIME-INR
INR: 2.4 — ABNORMAL HIGH (ref 0.8–1.2)
Prothrombin Time: 26.6 seconds — ABNORMAL HIGH (ref 11.4–15.2)

## 2022-07-09 MED ORDER — WARFARIN SODIUM 5 MG PO TABS: 10.0000 mg | ORAL_TABLET | Freq: Once | ORAL | Status: DC

## 2022-07-09 MED ORDER — ACETAMINOPHEN 325 MG PO TABS: 650.0000 mg | ORAL_TABLET | ORAL | 0 refills | Status: DC | PRN

## 2022-07-09 MED ORDER — BENZONATATE 100 MG PO CAPS: 100.0000 mg | ORAL_CAPSULE | Freq: Three times a day (TID) | ORAL | 0 refills | Status: DC | PRN

## 2022-07-09 MED ORDER — FUROSEMIDE 40 MG PO TABS
80.0000 mg | ORAL_TABLET | Freq: Two times a day (BID) | ORAL | Status: DC
  Administered 2022-07-09: 80 mg via ORAL
  Filled 2022-07-09: qty 2

## 2022-07-09 MED ORDER — DOXYCYCLINE HYCLATE 100 MG PO TABS: 100.0000 mg | ORAL_TABLET | Freq: Two times a day (BID) | ORAL | 0 refills | Status: AC

## 2022-07-09 MED ORDER — ALBUTEROL SULFATE HFA 108 (90 BASE) MCG/ACT IN AERS: 2.0000 | INHALATION_SPRAY | RESPIRATORY_TRACT | 11 refills | Status: DC | PRN

## 2022-07-09 MED ORDER — FUROSEMIDE 40 MG PO TABS: 80.0000 mg | ORAL_TABLET | Freq: Two times a day (BID) | ORAL | 3 refills | Status: DC

## 2022-07-09 MED ORDER — CEFDINIR 300 MG PO CAPS: 300.0000 mg | ORAL_CAPSULE | Freq: Two times a day (BID) | ORAL | 0 refills | Status: AC

## 2022-07-09 NOTE — Discharge Summary (Signed)
[name] [name], is a 47 y.o. female  DOB 03/02/1975  MRN [id number].  Admission date:  07/02/2022  Admitting Physician  Azucena Fallen, MD  Discharge Date:  07/09/2022   Primary MD  Benetta Spar, MD  Recommendations for primary care physician for things to follow:   1)Very low-salt diet advised--less than 2 gm sodium diet 2)Weigh yourself daily, call if you gain more than 3 pounds in 1 day or more than 5 pounds in 1 week as your diuretic medications may need to be adjusted 3)Limit your Fluid  intake to no more than 60 ounces (1.8 Liters) per day 4)Repeat CBC, BMP and PT/INR Blood tests in about 1 week 5)Avoid ibuprofen/Advil/Aleve/Motrin/Goody Powders/Naproxen/BC powders/Meloxicam/Diclofenac/Indomethacin and other Nonsteroidal anti-inflammatory medications as these will make you more likely to bleed and can cause stomach ulcers, can also cause Kidney problems.   Admission Diagnosis  Acute exacerbation of congestive heart failure (HCC) [I50.9] Acute on chronic heart failure, unspecified heart failure type (HCC) [I50.9] Hypervolemia, unspecified hypervolemia type [E87.70] Pneumonia of right upper lobe due to infectious organism [J18.9] Acute on chronic HFrEF (heart failure with reduced ejection fraction) (HCC) [I50.23]   Discharge Diagnosis  Acute exacerbation of congestive heart failure (HCC) [I50.9] Acute on chronic heart failure, unspecified heart failure type (HCC) [I50.9] Hypervolemia, unspecified hypervolemia type [E87.70] Pneumonia of right upper lobe due to infectious organism [J18.9] Acute on chronic HFrEF (heart failure with reduced ejection fraction) (HCC) [I50.23]    Principal Problem:   Acute exacerbation of congestive heart failure (HCC) Active Problems:   CAP (community acquired pneumonia)   Anemia   Tobacco use disorder   Obesity, Class III, BMI 40-49.9 (morbid obesity)  (HCC)   Uterine leiomyoma   Bilateral pulmonary embolism (HCC)   Chronic anticoagulation   Pulmonary edema   Acute on chronic HFrEF (heart failure with reduced ejection fraction) (HCC)   HFrEF (heart failure with reduced ejection fraction) (HCC)      Past Medical History:  Diagnosis Date   Anemia    Blood transfusion without reported diagnosis    CAD (coronary artery disease)    Nonobstructive   Closed left ankle fracture    August 30 2012   Clotting disorder (HCC)    DVT (deep venous thrombosis) (HCC)    L leg   Fibroid tumor    HFrEF (heart failure with reduced ejection fraction) (HCC)    Hidradenitis    Hypertension    Mitral regurgitation    NICM (nonischemic cardiomyopathy) (HCC)    NSVT (nonsustained ventricular tachycardia) (HCC)    Obesity    OSA on CPAP    Pulmonary embolism (HCC) 04/2013   Sleep apnea     Past Surgical History:  Procedure Laterality Date   ABSCESS DRAINAGE Bilateral 01/10/2015   BREAST BIOPSY Right 02/01/2020   benign   COLONOSCOPY WITH PROPOFOL N/A 06/08/2018   Procedure: COLONOSCOPY WITH PROPOFOL;  Surgeon: Corbin Ade, MD;  Location: [facility];  Service: Endoscopy;  Laterality: N/A;  2:30pm   CYST REMOVAL  TRUNK     IVC FILTER PLACEMENT ([facility] HX)     LOWER EXTREMITY VENOGRAPHY N/A 02/07/2021   Procedure: LOWER EXTREMITY VENOGRAPHY;  Surgeon: Victorino Sparrow, MD;  Location: [facility] INVASIVE CV LAB;  Service: Cardiovascular;  Laterality: N/A;   PERIPHERAL VASCULAR THROMBECTOMY N/A 02/07/2021   Procedure: PERIPHERAL VASCULAR THROMBECTOMY;  Surgeon: Victorino Sparrow, MD;  Location: [facility] INVASIVE CV LAB;  Service: Cardiovascular;  Laterality: N/A;   RIGHT HEART CATH N/A 03/08/2022   Procedure: RIGHT HEART CATH;  Surgeon: Dorthula Nettles, DO;  Location: [facility] INVASIVE CV LAB;  Service: Cardiovascular;  Laterality: N/A;   RIGHT/LEFT HEART CATH AND CORONARY ANGIOGRAPHY N/A 06/19/2021   Procedure: RIGHT/LEFT HEART CATH AND CORONARY ANGIOGRAPHY;   Surgeon: Lyn Records, MD;  Location: [facility] INVASIVE CV LAB;  Service: Cardiovascular;  Laterality: N/A;   TEE WITHOUT CARDIOVERSION N/A 03/08/2022   Procedure: TRANSESOPHAGEAL ECHOCARDIOGRAM (TEE);  Surgeon: Dorthula Nettles, DO;  Location: [facility] ENDOSCOPY;  Service: Cardiovascular;  Laterality: N/A;     HPI  from the history and physical done on the day of admission:     Chief Complaint: Shortness of breath, chest pain right-sided   HPI: [name] [name] is a 47 y.o. female with medical history significant of heart failure with reduced ejection fraction (last echo February 2024 EF 30 to 35%), nonsustained ventricular tachycardia, CAD, hypertension, history of PE/DVT, pulmonary hypertension, severe mitral regurgitation, obesity.  Patient presents to the hospital with worsening dyspnea, orthopnea, bilateral lower extremity edema.   ED Course: Patient found to be hypoxic requiring 4 L nasal cannula to maintain sats and to improve dyspnea, imaging remarkable for bilateral pleural effusions with questionable right upper lobe opacity concerning for possible pneumonia.   Review of Systems: As per HPI otherwise unremarkable     Hospital Course:   Brief Narrative:  47 y.o. female with medical history significant of heart failure with reduced ejection fraction (last echo February 2024 EF 30 to 35%), nonsustained ventricular tachycardia, CAD, hypertension, history of PE/DVT, pulmonary hypertension, severe mitral regurgitation, obesity admitted on 07/02/2022 with acute on chronic combined diastolic and systolic CHF exacerbation     -Assessment and Plan: 1)Acute on chronic combined systolic and diastolic dysfunction/exacerbation -Repeat echo from 07/03/2022 with EF down to 25-30% (Prior EF was 30 to 35% in February 2024) with regional wall motion abnormalities (The entire lateral wall, entire inferior wall, apical anterior segment  and apex are akinetic) , severely dilated left ventricular internal cavity  size, moderate pulmonary hypertension -Echo also shows grade 3 diastolic dysfunction CHF and RV pressure overload -Moderate to severe MR -No aortic stenosis -Patient had right heart cath on 03/08/2022, cards recommended possible mitral valve replacement at the time -Cardiology consult appreciated -Baseline weight 252 to 255 pounds-- -at the time of admission this time around weight was almost 30 pounds up --Patient was treated with IV Lasix  -Negative fluid balance- -Weaned off oxygen as of 07/05/2022 -Weight on admission was 288 currently down to 262 -Outpatient follow-up with heart failure clinic advised -Outpatient follow-up with structural heart clinic for evaluation of possible MitraClip advised   2) history of DVT/PE--- prior history of unprovoked saddle PE  continue chronic anticoagulation with Coumadin -INR is therapeutic,  - stopped Lovenox bridge as of 07/06/2022   3)Obesity- -Low calorie diet, portion control and increase physical activity discussed with patient -Body mass index is 37.5 kg/m.   4)Valvular heart disease--moderate to severe MR--see #1 above -Patient is to follow-up as outpatient with structural heart  team for possible mitral valve repair   5) acute hypoxic respiratory failure--- due to 1 above -Hypoxia resolved with diuresis   6)Hyperkalemia--resolved with Lokelma    Disposition: The patient is from: Home              Anticipated d/c is to: Home   Discharge Condition: stable  Follow UP   Follow-up Information     Benetta Spar, MD Follow up in 1 week(s).   Specialty: Internal Medicine Why: Repeat CBC, BMP and PT/INR in 1 week Contact information: [address] [place] [address] [phone]                  Consults obtained - card  Diet and Activity recommendation:  As advised  Discharge Instructions    Discharge Instructions     Amb Referral to HF Clinic   Complete by: As directed    May opt for  tele-visits if transport becomes an issue   Reason for referral: Systolic HF   Call MD for:  difficulty breathing, headache or visual disturbances   Complete by: As directed    Call MD for:  persistant dizziness or light-headedness   Complete by: As directed    Call MD for:  persistant nausea and vomiting   Complete by: As directed    Call MD for:  temperature >100.4   Complete by: As directed    Diet - low sodium heart healthy   Complete by: As directed    Discharge instructions   Complete by: As directed    1)Very low-salt diet advised--less than 2 gm sodium diet 2)Weigh yourself daily, call if you gain more than 3 pounds in 1 day or more than 5 pounds in 1 week as your diuretic medications may need to be adjusted 3)Limit your Fluid  intake to no more than 60 ounces (1.8 Liters) per day 4)Repeat CBC, BMP and PT/INR Blood tests in about 1 week 5)Avoid ibuprofen/Advil/Aleve/Motrin/Goody Powders/Naproxen/BC powders/Meloxicam/Diclofenac/Indomethacin and other Nonsteroidal anti-inflammatory medications as these will make you more likely to bleed and can cause stomach ulcers, can also cause Kidney problems.   Increase activity slowly   Complete by: As directed          Discharge Medications     Allergies as of 07/09/2022       Reactions   Sulfa Antibiotics Itching, Swelling, Rash   Lip swelling        Medication List     TAKE these medications    acetaminophen 325 MG tablet Commonly known as: TYLENOL Take 2 tablets (650 mg total) by mouth every 4 (four) hours as needed for headache or mild pain.   albuterol 108 (90 Base) MCG/ACT inhaler Commonly known as: VENTOLIN HFA Inhale 2 puffs into the lungs every 2 (two) hours as needed for wheezing or shortness of breath.   atorvastatin 20 MG tablet Commonly known as: LIPITOR Take 1 tablet (20 mg total) by mouth daily.   benzonatate 100 MG capsule Commonly known as: TESSALON Take 1 capsule (100 mg total) by mouth 3 (three)  times daily as needed for cough.   cefdinir 300 MG capsule Commonly known as: OMNICEF Take 1 capsule (300 mg total) by mouth 2 (two) times daily for 5 days.   doxycycline 100 MG tablet Commonly known as: VIBRA-TABS Take 1 tablet (100 mg total) by mouth 2 (two) times daily for 5 days.   empagliflozin 10 MG Tabs tablet Commonly known as: Jardiance Take 1 tablet (10  mg total) by mouth daily before breakfast.   furosemide 40 MG tablet Commonly known as: LASIX Take 2 tablets (80 mg total) by mouth 2 (two) times daily. What changed: how much to take   losartan 25 MG tablet Commonly known as: COZAAR Take 0.5 tablets (12.5 mg total) by mouth daily.   metoprolol succinate 25 MG 24 hr tablet Commonly known as: TOPROL-XL Take 1 tablet (25 mg total) by mouth daily. Take with or immediately following a meal.   midodrine 2.5 MG tablet Commonly known as: PROAMATINE Take 1 tablet (2.5 mg total) by mouth 2 (two) times daily with a meal.   potassium chloride SA 20 MEQ tablet Commonly known as: KLOR-CON M Take 20 mEq by mouth 2 (two) times daily.   spironolactone 25 MG tablet Commonly known as: ALDACTONE Take 1 tablet (25 mg total) by mouth daily.   warfarin 10 MG tablet Commonly known as: COUMADIN Take as directed. If you are unsure how to take this medication, talk to your nurse or doctor. Original instructions: Take 1/2 tablet to 1 tablet daily or as directed by coumadin clinic. What changed:  how much to take how to take this when to take this additional instructions        Major procedures and Radiology Reports - PLEASE review detailed and final reports for all details, in brief -   DG Chest 2 View  Result Date: 07/07/2022 CLINICAL DATA:  [id number] Dyspnea and respiratory abnormalities [id number] foot EXAM: CHEST - 2 VIEW COMPARISON:  07/02/2022 FINDINGS: Stable cardiomegaly. Mild vascular congestion. Persistent airspace opacity within the right upper lobe, minimally improving. No  new focal consolidation. No pleural effusion or pneumothorax. IMPRESSION: 1. Persistent airspace opacity within the right upper lobe, minimally improving. 2. Stable cardiomegaly with mild vascular congestion. Electronically Signed   By: Duanne Guess D.O.   On: 07/07/2022 12:21   ECHOCARDIOGRAM COMPLETE  Result Date: 07/03/2022    ECHOCARDIOGRAM REPORT   Patient Name:   [name] [name] Date of Exam: 07/03/2022 Medical Rec #:  [id number]       Height:       71.0 in Accession #:    [id number]      Weight:       286.2 lb Date of Birth:  1975/08/18       BSA:          2.455 m Patient Age:    46 years        BP:           100/69 mmHg Patient Gender: F               HR:           59 bpm. [facility] Procedure: 2D Echo, Cardiac Doppler, Color Doppler and Intracardiac            Opacification Agent Indications:    Congestive Heart Failure I50.9  History:        Patient has prior history of Echocardiogram examinations, most                 recent 03/14/2022. CHF and Cardiomyopathy, CAD, P.E., Mitral                 Valve Disease; Risk Factors:Current Smoker, Hypertension and                 Sleep Apnea.  Sonographer:    Aron Baba Referring Phys: [id number] Azucena Fallen  Sonographer Comments:  Image acquisition challenging due to patient body habitus. IMPRESSIONS  1. Left ventricular ejection fraction, by estimation, is 25 to 30%. The left ventricle has severely decreased function. The left ventricle demonstrates regional wall motion abnormalities (see scoring diagram/findings for description). The left ventricular internal cavity size was severely dilated. Left ventricular diastolic parameters are consistent with Grade III diastolic dysfunction (restrictive). Elevated left atrial pressure.  2. Ventricular septum is flattened in systole suggesting RV pressure overload. . Right ventricular systolic function is mildly reduced. The right ventricular size is normal. There is moderately elevated pulmonary  artery systolic pressure.  3. Left atrial size was severely dilated.  4. Functional eccentric posterior directed MR difficult to quantify, at least moderate possibly severe MR. MV/AV VTI ratio 1.3 suggesting moderate to severe MR. The mitral valve is abnormal. Moderate to severe mitral valve regurgitation. No evidence of mitral stenosis.  5. The tricuspid valve is abnormal. Tricuspid valve regurgitation is moderate to severe.  6. The aortic valve is tricuspid. There is mild calcification of the aortic valve. There is mild thickening of the aortic valve. Aortic valve regurgitation is not visualized. No aortic stenosis is present. FINDINGS  Left Ventricle: Left ventricular ejection fraction, by estimation, is 25 to 30%. The left ventricle has severely decreased function. The left ventricle demonstrates regional wall motion abnormalities. Definity contrast agent was given IV to delineate the left ventricular endocardial borders. The left ventricular internal cavity size was severely dilated. There is no left ventricular hypertrophy. Left ventricular diastolic parameters are consistent with Grade III diastolic dysfunction (restrictive). Elevated left atrial pressure.  LV Wall Scoring: The entire lateral wall, entire inferior wall, apical anterior segment, and apex are akinetic. Right Ventricle: Ventricular septum is flattened in systole suggesting RV pressure overload. The right ventricular size is normal. Right vetricular wall thickness was not well visualized. Right ventricular systolic function is mildly reduced. There is moderately elevated pulmonary artery systolic pressure. The tricuspid regurgitant velocity is 3.00 m/s, and with an assumed right atrial pressure of 15 mmHg, the estimated right ventricular systolic pressure is 51.0 mmHg. Left Atrium: Left atrial size was severely dilated. Right Atrium: Right atrial size was normal in size. Pericardium: There is no evidence of pericardial effusion. Mitral Valve:  Functional eccentric posterior directed MR difficult to quantify, at least moderate possibly severe MR. MV/AV VTI ratio 1.3 suggesting moderate to severe MR. The mitral valve is abnormal. Moderate to severe mitral valve regurgitation. No evidence of mitral valve stenosis. Tricuspid Valve: The tricuspid valve is abnormal. Tricuspid valve regurgitation is moderate to severe. No evidence of tricuspid stenosis. Aortic Valve: The aortic valve is tricuspid. There is mild calcification of the aortic valve. There is mild thickening of the aortic valve. There is mild aortic valve annular calcification. Aortic valve regurgitation is not visualized. No aortic stenosis  is present. Aortic valve mean gradient measures 2.9 mmHg. Aortic valve peak gradient measures 5.1 mmHg. Aortic valve area, by VTI measures 1.62 cm. Pulmonic Valve: The pulmonic valve was not well visualized. Pulmonic valve regurgitation is mild. No evidence of pulmonic stenosis. Aorta: The aortic root and ascending aorta are structurally normal, with no evidence of dilitation. IAS/Shunts: The interatrial septum was not well visualized.  LEFT VENTRICLE PLAX 2D LVIDd:         7.50 cm   Diastology LVIDs:         6.30 cm   LV e' medial:    5.61 cm/s LV PW:         0.90  cm   LV E/e' medial:  24.6 LV IVS:        0.70 cm   LV e' lateral:   6.83 cm/s LVOT diam:     1.90 cm   LV E/e' lateral: 20.2 LV SV:         35 LV SV Index:   14 LVOT Area:     2.84 cm  RIGHT VENTRICLE RV S prime:     7.71 cm/s TAPSE (M-mode): 1.3 cm LEFT ATRIUM            Index        RIGHT ATRIUM           Index LA diam:      6.00 cm  2.44 cm/m   RA Area:     25.90 cm LA Vol (A4C): 132.0 ml 53.76 ml/m  RA Volume:   80.50 ml  32.79 ml/m  AORTIC VALVE                    PULMONIC VALVE AV Area (Vmax):    1.56 cm     PR End Diast Vel: 9.18 msec AV Area (Vmean):   1.43 cm AV Area (VTI):     1.62 cm AV Vmax:           113.34 cm/s AV Vmean:          78.829 cm/s AV VTI:            0.219 m AV Peak  Grad:      5.1 mmHg AV Mean Grad:      2.9 mmHg LVOT Vmax:         62.20 cm/s LVOT Vmean:        39.800 cm/s LVOT VTI:          0.125 m LVOT/AV VTI ratio: 0.57  AORTA Ao Root diam: 2.90 cm Ao Asc diam:  2.85 cm MITRAL VALVE                 TRICUSPID VALVE MV Area (PHT): 5.38 cm      TR Peak grad:   36.0 mmHg MV Decel Time: 141 msec      TR Vmax:        300.00 cm/s MR Peak grad:   60.7 mmHg MR Mean grad:   42.0 mmHg    SHUNTS MR Vmax:        389.50 cm/s  Systemic VTI:  0.12 m MR Vmean:       307.0 cm/s   Systemic Diam: 1.90 cm MR PISA:        9.05 cm MR PISA Radius: 1.20 cm MV E velocity: 138.00 cm/s MV A velocity: 31.00 cm/s MV E/A ratio:  4.45 Dina Rich MD Electronically signed by Dina Rich MD Signature Date/Time: 07/03/2022/10:14:34 AM    Final    DG Chest 2 View  Result Date: 07/02/2022 CLINICAL DATA:  Chest pain EXAM: CHEST - 2 VIEW COMPARISON:  02/13/2022 FINDINGS: Cardiomegaly and pulmonary vascular congestion. Airspace consolidation within the inferior aspect of the right upper lobe abutting the horizontal fissure. Left lung is clear. No pleural effusion or pneumothorax. IMPRESSION: 1. Right upper lobe pneumonia. 2. Cardiomegaly and pulmonary vascular congestion. Electronically Signed   By: Duanne Guess D.O.   On: 07/02/2022 12:56    Micro Results   Recent Results (from the past 240 hour(s))  Resp panel by RT-PCR (RSV, Flu A&B, Covid) Anterior Nasal Swab     Status: None  Collection Time: 07/02/22  3:08 PM   Specimen: Anterior Nasal Swab  Result Value Ref Range Status   SARS Coronavirus 2 by RT PCR NEGATIVE NEGATIVE Final    Comment: (NOTE) SARS-CoV-2 target nucleic acids are NOT DETECTED.  The SARS-CoV-2 RNA is generally detectable in upper respiratory specimens during the acute phase of infection. The lowest concentration of SARS-CoV-2 viral copies this assay can detect is 138 copies/mL. A negative result does not preclude SARS-Cov-2 infection and should not be used as  the sole basis for treatment or other patient management decisions. A negative result may occur with  improper specimen collection/handling, submission of specimen other than nasopharyngeal swab, presence of viral mutation(s) within the areas targeted by this assay, and inadequate number of viral copies(<138 copies/mL). A negative result must be combined with clinical observations, patient history, and epidemiological information. The expected result is Negative.  Fact Sheet for Patients:  BloggerCourse.com  Fact Sheet for Healthcare Providers:  SeriousBroker.it  This test is no t yet approved or cleared by the [place] FDA and  has been authorized for detection and/or diagnosis of SARS-CoV-2 by FDA under an Emergency Use Authorization (EUA). This EUA will remain  in effect (meaning this test can be used) for the duration of the COVID-19 declaration under Section 564(b)(1) of the Act, 21 U.S.C.section 360bbb-3(b)(1), unless the authorization is terminated  or revoked sooner.       Influenza A by PCR NEGATIVE NEGATIVE Final   Influenza B by PCR NEGATIVE NEGATIVE Final    Comment: (NOTE) The Xpert Xpress SARS-CoV-2/FLU/RSV plus assay is intended as an aid in the diagnosis of influenza from Nasopharyngeal swab specimens and should not be used as a sole basis for treatment. Nasal washings and aspirates are unacceptable for Xpert Xpress SARS-CoV-2/FLU/RSV testing.  Fact Sheet for Patients: BloggerCourse.com  Fact Sheet for Healthcare Providers: SeriousBroker.it  This test is not yet approved or cleared by the [place] FDA and has been authorized for detection and/or diagnosis of SARS-CoV-2 by FDA under an Emergency Use Authorization (EUA). This EUA will remain in effect (meaning this test can be used) for the duration of the COVID-19 declaration under Section 564(b)(1) of  the Act, 21 U.S.C. section 360bbb-3(b)(1), unless the authorization is terminated or revoked.     Resp Syncytial Virus by PCR NEGATIVE NEGATIVE Final    Comment: (NOTE) Fact Sheet for Patients: BloggerCourse.com  Fact Sheet for Healthcare Providers: SeriousBroker.it  This test is not yet approved or cleared by the [place] FDA and has been authorized for detection and/or diagnosis of SARS-CoV-2 by FDA under an Emergency Use Authorization (EUA). This EUA will remain in effect (meaning this test can be used) for the duration of the COVID-19 declaration under Section 564(b)(1) of the Act, 21 U.S.C. section 360bbb-3(b)(1), unless the authorization is terminated or revoked.  Performed at [facility], [address], [place], [place] [address]     Today   Subjective    [name] [name] today has no new complaints Ambulating without hypoxia or significant dyspnea on exertion at this time          Patient has been seen and examined prior to discharge   Objective   Blood pressure 105/68, pulse 62, temperature 97.7 F (36.5 C), resp. rate 20, height 5\' 11"  (1.803 m), weight 119.2 kg, SpO2 100 %.   Intake/Output Summary (Last 24 hours) at 07/09/2022 1410 Last data filed at 07/09/2022 0620 Gross per 24 hour  Intake 440 ml  Output  1600 ml  Net -1160 ml    Exam Gen:- Awake Alert, no acute distress  HEENT:- [place].AT, No sclera icterus Neck-Supple Neck, +ve JVD,.  Lungs-improved air movement, no wheezing  CV- S1, S2 normal, regular, 2/6 systolic murmur Abd-  +ve B.Sounds, Abd Soft, No tenderness,    Extremity/Skin:-Much improved edema,   good pulses Psych-affect is appropriate, oriented x3 Neuro-no new focal deficits, no tremors    Data Review   CBC w Diff:  Lab Results  Component Value Date   WBC 3.4 (L) 07/08/2022   HGB 10.5 (L) 07/08/2022   HGB 12.5 01/18/2015   HCT 35.1 (L) 07/08/2022   HCT 37.7 01/18/2015   PLT  236 07/08/2022   PLT 214 01/18/2015   LYMPHOPCT 17 01/24/2022   MONOPCT 9 01/24/2022   EOSPCT 1 01/24/2022   BASOPCT 2 01/24/2022   CMP:  Lab Results  Component Value Date   NA 135 07/09/2022   NA 139 01/18/2015   K 4.1 07/09/2022   CL 98 07/09/2022   CO2 30 07/09/2022   BUN 20 07/09/2022   BUN 9 01/18/2015   CREATININE 1.10 (H) 07/09/2022   CREATININE 0.74 01/31/2017   PROT 7.3 07/02/2022   PROT 7.1 01/18/2015   ALBUMIN 2.9 (L) 07/09/2022   ALBUMIN 3.7 01/18/2015   BILITOT 1.8 (H) 07/02/2022   BILITOT <0.2 01/18/2015   ALKPHOS 83 07/02/2022   AST 26 07/02/2022   ALT 35 07/02/2022  . Total Discharge time is about 33 minutes  Shon Hale M.D on 07/09/2022 at 2:10 PM  Go to [email or web address] -  for contact info  [facility] - Office  [phone]

## 2022-07-09 NOTE — Progress Notes (Addendum)
Patient Name: [name] [name] Date of Encounter: 07/09/2022 [facility] Cardiologist: Dina Rich, MD    Interval Summary  .    Feeling better and thinks she's at baseline.  Vital Signs .    Vitals:   07/08/22 1100 07/08/22 2157 07/08/22 2200 07/09/22 0513  BP:  103/67 (!) 108/55 105/68  Pulse:  (!) 58 80 62  Resp: (!) 21 20 18 20   Temp:  98.6 F (37 C) 97.6 F (36.4 C) 97.7 F (36.5 C)  TempSrc:      SpO2:  100% 95% 100%  Weight:    119.2 kg  Height:        Intake/Output Summary (Last 24 hours) at 07/09/2022 0811 Last data filed at 07/09/2022 6045 Gross per 24 hour  Intake 680 ml  Output 4300 ml  Net -3620 ml      07/09/2022    5:13 AM 07/08/2022    5:33 AM 07/07/2022    5:00 AM  Last 3 Weights  Weight (lbs) 262 lb 12.6 oz 268 lb 14.4 oz 272 lb  Weight (kg) 119.2 kg 121.972 kg 123.378 kg      Telemetry/ECG    NSR with PVC's - Personally Reviewed  Physical Exam .   GEN: No acute distress.   Neck: No JVD Cardiac:  RRR, 3-4/6 systolic murmur apex Respiratory: Clear to auscultation bilaterally. GI: Soft, nontender, non-distended  MS: No edema  Assessment & Plan .      1.Acute on chronic HFrEF - 02/2022 echo: LVEF 30-35%, mod RV dysfunction, severe pulm HTN, severe MR 02/2022 RHC: mean PA 43, PCWP 27, CI 2.15, PVR 3 WU 05/2021 LHC: 50% RCA otherwise normal 02/2022 TEE LVEF 35%, severe funcational MR 06/2022 echo: LVEF 25-30%, grade III dd, mild RV dysfunction, mod to severe functional MR cMRI ordered by has not scheduled.  - noncompliant with HF clinic f/u, missed structural heart evaluation twice-her daughter was sick-have arrange f/u with structural heart-she's awar.  - Up 30 lbs from 04/2022 clinic visit on admission and now down 21 lbs but appears euvolemic.   - negative 3.6L yesterday, neg 14.5L since admission.  . She is on IV lasix 80mg  bid, Crt stable. Would transition to oral lasix,   - admit weight 288, at 04/2022 appt 253 lbs.?  Accuracy. 262 lbs today and appears close to euvolemia Crt bumped up today 1.10 change lasix 80 mg po bid(was on 80 am 40 pm PTA) -will need bmet in 1 week - reds vest low quality - soft bp's limit medical therapy. toprol lowered 25mg  to 12.5mg , continue losartan 12.5mg . Continue to hold aldactone for now. [place] Pepper on hold in case NPO needed this admission at some point.        2.Severe functional MR - no showed for 2 appointment with structural heart team -she says her daughter was sick and she couldn't make it but plans to f/u with Dr. Excell Seltzer. I have arranged f/u July 9-patient is aware     3. History of DVT/PE - she is on coumadin, her hematolgist has favored coumadin over DOACs for her historically - INR subtherapeutic on admission, now 2.7. History of unprovoked saddle PE in 2015,     For questions or updates, please contact [facility] Please consult [email or web address] for contact info under        Signed, Jacolyn Reedy, PA-C    Attending note:  Patient seen and examined.  I reviewed the chart and discussed the case with  Ms. Lilla Shook, I agree with her above findings.  Ms. Tadesse is much closer to baseline in terms of fluid status, clinically improved.  Afebrile, heart rate in the 60s in sinus rhythm, blood pressure 105/68.  Net urine output of 3600 cc last 24 hours.  Lungs are clear.  Cardiac exam with RRR and 2/6 apical systolic murmur.  Pertinent lab work includes potassium 4.1, BUN 20, creatinine bumped up to 1.1 from 0.93, hemoglobin 10.5, INR 2.4.  Anticipate discharge home today with close outpatient follow-up arranged including getting established in the structural heart clinic.  Transition to Lasix 80 mg twice daily for discharge and otherwise continue losartan 12.5 mg daily, Toprol-XL 12.5 mg daily, Lipitor, and Coumadin.  Can likely add SGLT2 inhibitor next as outpatient but would hold off on MRA for now.  Needs follow-up BMET for next office  visit.  Jonelle Sidle, M.D., F.A.C.C.

## 2022-07-09 NOTE — Discharge Instructions (Signed)
1)Very low-salt diet advised--less than 2 gm sodium diet 2)Weigh yourself daily, call if you gain more than 3 pounds in 1 day or more than 5 pounds in 1 week as your diuretic medications may need to be adjusted 3)Limit your Fluid  intake to no more than 60 ounces (1.8 Liters) per day 4)Repeat CBC, BMP and PT/INR Blood tests in about 1 week 5)Avoid ibuprofen/Advil/Aleve/Motrin/Goody Powders/Naproxen/BC powders/Meloxicam/Diclofenac/Indomethacin and other Nonsteroidal anti-inflammatory medications as these will make you more likely to bleed and can cause stomach ulcers, can also cause Kidney problems.

## 2022-07-09 NOTE — Consult Note (Addendum)
[facility] Customer service manager [facility]) Accountable Care Organization (ACO) [facility] Liaison Note  07/09/2022  [name] 1975-12-09 [id number]  Location: [facility] RN Hospital Liaison screened the patient remotely at [facility].  Insurance: [name] [name] is a 47 y.o. female who is a Primary Care Patient of Fanta, Wayland Salinas, MD. The patient was screened for readmission hospitalization with noted extreme risk score for unplanned readmission risk with 3 IP in 6 months.  The patient was assessed for potential [facility]) Care Management service needs for post hospital transition for care coordination. Review of patient's electronic medical record reveals patient was admitted for Heart Failure. Spoke with pt concerning available care management service post discharge. Pt receptive to a referral for RN care coordination to assist with readmission prevention measures in managing her ongoing care.  Plan: [facility] Liaison will continue to follow progress and disposition to asess for post hospital community care coordination/management needs.  Referral request for community care coordination: pending disposition.   [facility] Care Management/Population Health does not replace or interfere with any arrangements made by the Inpatient Transition of Care team.   For questions contact:   [name] Cousin, RN, BSN [facility] Liaison [place]   [facility]  Population Health Office Hours MTWF  8:00 am-6:00 pm Off on Thursday [phone] mobile [phone] [Office toll free line][facility] Office Hours are M-F 8:30 - 5 pm 24 hour nurse advise line [phone] Concierge  [name]

## 2022-07-09 NOTE — Progress Notes (Signed)
Patient declined the use of a CPAP this evening since she will be discharged tomorrow.RT will have a unit on standby for use.

## 2022-07-09 NOTE — Progress Notes (Signed)
ANTICOAGULATION CONSULT NOTE  Pharmacy Consult for warfarin  Indication:  hx PE /DVT  Allergies  Allergen Reactions   Sulfa Antibiotics Itching, Swelling and Rash    Lip swelling    Patient Measurements: Height: 5\' 11"  (180.3 cm) Weight: 119.2 kg (262 lb 12.6 oz) IBW/kg (Calculated) : 70.8  Labs: Recent Labs    07/07/22 0514 07/08/22 0529 07/09/22 0455  HGB  --  10.5*  --   HCT  --  35.1*  --   PLT  --  236  --   LABPROT 27.8* 28.6* 26.6*  INR 2.6* 2.7* 2.4*  CREATININE 0.93  --  1.10*    Estimated Creatinine Clearance: 92 mL/min (A) (by C-G formula based on SCr of 1.1 mg/dL (H)).  Assessment: Pharmacy consulted to dose warfarin in patient with history of pulmonary embolism.  INR on admission was subtherapeutic at 1.4.  Home dose listed as 5 mg on Sun + Thurs and 10 mg ROW.  INR 2.4   Goal of Therapy:  INR 2-3 Monitor platelets by anticoagulation protocol: Yes   Plan:  Warfarin 10 mg po x1 Monitor daily INR and s/s of bleeding.  Judeth Cornfield, PharmD Clinical Pharmacist 07/09/2022 8:10 AM

## 2022-07-11 ENCOUNTER — Encounter (HOSPITAL_COMMUNITY): Payer: Medicare HMO

## 2022-07-15 ENCOUNTER — Telehealth: Payer: Self-pay | Admitting: *Deleted

## 2022-07-15 NOTE — Progress Notes (Signed)
  Care Coordination   Note   07/15/2022 Name: [name] [name] MRN: [id number] DOB: 08-20-1975  [name] [name] is a 47 y.o. year old female who sees Fanta, Wayland Salinas, MD for primary care. I reached out to [name] by phone today to offer care coordination services.  Ms. Waldeck was given information about Care Coordination services today including:   The Care Coordination services include support from the care team which includes your Nurse Coordinator, Clinical Social Worker, or Pharmacist.  The Care Coordination team is here to help remove barriers to the health concerns and goals most important to you. Care Coordination services are voluntary, and the patient may decline or stop services at any time by request to their care team member.   Care Coordination Consent Status: Patient agreed to services and verbal consent obtained.   Follow up plan:  Telephone appointment with care coordination team member scheduled for:  07/24/22  Encounter Outcome:  Pt. Scheduled  [facility] Coordination Care Guide  Direct Dial: [phone]

## 2022-07-16 ENCOUNTER — Encounter (HOSPITAL_COMMUNITY): Payer: Medicare HMO

## 2022-07-18 ENCOUNTER — Encounter (HOSPITAL_COMMUNITY): Payer: Medicare HMO

## 2022-07-23 ENCOUNTER — Ambulatory Visit: Payer: Medicare HMO | Attending: Nurse Practitioner | Admitting: Nurse Practitioner

## 2022-07-23 ENCOUNTER — Encounter: Payer: Self-pay | Admitting: Nurse Practitioner

## 2022-07-23 VITALS — BP 110/70 | HR 68 | Ht 71.0 in | Wt 253.2 lb

## 2022-07-23 DIAGNOSIS — Z79899 Other long term (current) drug therapy: Secondary | ICD-10-CM | POA: Diagnosis not present

## 2022-07-23 DIAGNOSIS — Z86718 Personal history of other venous thrombosis and embolism: Secondary | ICD-10-CM

## 2022-07-23 DIAGNOSIS — I272 Pulmonary hypertension, unspecified: Secondary | ICD-10-CM | POA: Diagnosis not present

## 2022-07-23 DIAGNOSIS — Z86711 Personal history of pulmonary embolism: Secondary | ICD-10-CM

## 2022-07-23 DIAGNOSIS — I2510 Atherosclerotic heart disease of native coronary artery without angina pectoris: Secondary | ICD-10-CM | POA: Diagnosis not present

## 2022-07-23 DIAGNOSIS — I340 Nonrheumatic mitral (valve) insufficiency: Secondary | ICD-10-CM

## 2022-07-23 DIAGNOSIS — I5042 Chronic combined systolic (congestive) and diastolic (congestive) heart failure: Secondary | ICD-10-CM | POA: Diagnosis not present

## 2022-07-23 DIAGNOSIS — I4729 Other ventricular tachycardia: Secondary | ICD-10-CM | POA: Diagnosis not present

## 2022-07-23 DIAGNOSIS — I10 Essential (primary) hypertension: Secondary | ICD-10-CM

## 2022-07-23 DIAGNOSIS — I361 Nonrheumatic tricuspid (valve) insufficiency: Secondary | ICD-10-CM

## 2022-07-23 DIAGNOSIS — Z87898 Personal history of other specified conditions: Secondary | ICD-10-CM

## 2022-07-23 DIAGNOSIS — R222 Localized swelling, mass and lump, trunk: Secondary | ICD-10-CM

## 2022-07-23 DIAGNOSIS — E669 Obesity, unspecified: Secondary | ICD-10-CM

## 2022-07-23 MED ORDER — WARFARIN SODIUM 10 MG PO TABS
ORAL_TABLET | ORAL | 0 refills | Status: DC
Start: 2022-07-23 — End: 2022-08-14

## 2022-07-23 NOTE — Patient Instructions (Addendum)
Medication Instructions:  Your physician recommends that you continue on your current medications as directed. Please refer to the Current Medication list given to you today.  Labwork: CBC,BMET,BNP.MAG in 1 week at [facility]  Testing/Procedures: none  Follow-Up: Your physician recommends that you schedule a follow-up appointment in: 6-8 weeks with Philis Nettle  Any Other Special Instructions Will Be Listed Below (If Applicable). Referral sent to Heart failure clinic at [facility]  If you need a refill on your cardiac medications before your next appointment, please call your pharmacy.

## 2022-07-23 NOTE — Progress Notes (Addendum)
Cardiology Office Note:  .   Date:  07/23/2022  ID:  [name] [name], DOB [birth year], MRN [id number] PCP: Benetta Spar, MD  [facility] Providers Cardiologist:  Dina Rich, MD Electrophysiologist:  Maurice Small, MD    History of Present Illness: .   NYISHA [name] is a 47 y.o. female with a PMH of nonobstructive CAD, chronic combined CHF (NICM), hx of unprovoked saddle PE in 2015, s/p IVC filter, recurrent unprovoked DVT, s/p thrombectomy, HTN, NSVT, OSA on CPAP, pulmonary HTN, hx of MR and TR, hx of syncope, obesity, and chronic anemia, who presents today for scheduled folllow-up.   Last saw patient on May 21, 2022. She noticed weight was up and increased edema, lasix dosage was increased.   Hospital admission 06/2022 for acute on chronic combined CHF exacerbation. Repeat Echo EF 25-30% from 30-35%, grade 3 DD, moderate to severe MR.   Today she presents for follow-up. She states she is doing much better since leaving the hospital. Weight is at her baseline. Compliant with her medications. Denies any shortness of breath, palpitations, syncope, presyncope, dizziness, orthopnea, PND, swelling or significant weight changes, acute bleeding, or claudication. Does note some soreness along her chest at times, but denies any anginal chest pain symptoms.   Studies Reviewed: .    Echo 06/2022:  1. Left ventricular ejection fraction, by estimation, is 25 to 30%. The  left ventricle has severely decreased function. The left ventricle  demonstrates regional wall motion abnormalities (see scoring  diagram/findings for description). The left  ventricular internal cavity size was severely dilated. Left ventricular  diastolic parameters are consistent with Grade III diastolic dysfunction  (restrictive). Elevated left atrial pressure.   2. Ventricular septum is flattened in systole suggesting RV pressure  overload. . Right ventricular systolic function is mildly reduced. The   right ventricular size is normal. There is moderately elevated pulmonary  artery systolic pressure.   3. Left atrial size was severely dilated.   4. Functional eccentric posterior directed MR difficult to quantify, at  least moderate possibly severe MR. MV/AV VTI ratio 1.3 suggesting moderate  to severe MR. The mitral valve is abnormal. Moderate to severe mitral  valve regurgitation. No evidence of  mitral stenosis.   5. The tricuspid valve is abnormal. Tricuspid valve regurgitation is  moderate to severe.   6. The aortic valve is tricuspid. There is mild calcification of the  aortic valve. There is mild thickening of the aortic valve. Aortic valve  regurgitation is not visualized. No aortic stenosis is present.  Right heart cath 02/2022:  IMPRESSION: Severely elevated pre and post capillary filling pressures.  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    RECOMMENDATIONS: - Increase outpatient diuretics.  - Start evaluation for mitral valve repair/replacement.   TEE 02/2022:  1. Left ventricular ejection fraction, by estimation, is 35%. The left  ventricle has moderately decreased function. The left ventricular internal  cavity size was moderately to severely dilated measuring 7.1cm.   2. Right ventricular systolic function is moderately reduced. The right  ventricular size is mildly enlarged.   3. Left atrial size was moderately dilated. No left atrial/left atrial  appendage thrombus was detected.   4. Right atrial size was mildly dilated.   5. There is severe posteriorly directed eccentric mitral valve  regurgitation secondary to restriction of the posterior mitral valve  leaflet and poor leaflet coaptation. There  is blunting of right upper  pulmonary vein flow. BP during study 100s/60s.   6. Tricuspid valve regurgitation is mild to moderate.   7. The aortic valve is normal  in structure. Aortic valve regurgitation is  not visualized.     Physical Exam:   VS:  BP 110/70   Pulse 68   Ht 5\' 11"  (1.803 m)   Wt 253 lb 3.2 oz (114.9 kg)   SpO2 99%   BMI 35.31 kg/m    Wt Readings from Last 3 Encounters:  07/23/22 253 lb 3.2 oz (114.9 kg)  07/09/22 262 lb 12.6 oz (119.2 kg)  05/21/22 253 lb 9.6 oz (115 kg)    GEN: Obese, 47 y.o. female in no acute distress NECK: No JVD; No carotid bruits CARDIAC: S1/S2, RRR, Grade 2/6 murmur, no rubs, no gallops, Focal swelling noted to right chest wall under collar bone, stable from last OV.  RESPIRATORY:  Clear to auscultation without rales, wheezing or rhonchi  ABDOMEN: Soft, non-tender, non-distended EXTREMITIES:  Generalized nonpitting edema; No deformity   ASSESSMENT AND PLAN: .    Chronic combined CHF, medication management Stage C, NYHA class I-II symptoms. TTE 06/2022 EF 25-30%, from 30-35%, grade 3 DD. Euvolemic and well compensated on exam.  Cardiac MRI has been arranged and ordered previously. Continue current GDMT, unable to further titrate d/t current BP. Low sodium diet, fluid restriction <2L, and daily weights encouraged. Educated to contact our office for weight gain of 2 lbs overnight or 5 lbs in one week.  Will arrange follow-up/ provide referral to HF clinic. Will obtain BMET, Mag, and BNP in 1 week.    Hx of syncope, hx of NSVT Etiology felt to be vasovagal. Denies any recurrence or symptoms. Past monitor revealed NSVT/PVC's. Continue current medication regimen.  Cardiac MRI has been previously arranged as mentioned above.  Heart healthy diet recommended. Continue to follow-up with EP.    CAD Stable with no anginal symptoms. No indication for ischemic evaluation.  Previous cardiac catheterization 05/2021 revealed nonobstructive CAD.  Not on aspirin due to being on Coumadin.  Continue atorvastatin, losartan, and Toprol-XL.  Heart healthy diet recommended.   HTN Blood pressure chronically soft. Continue  current medication regimen.   Discussed to monitor BP at home at least 2 hours after medications and sitting for 5-10 minutes. Heart healthy diet recommended.    Hx of PE/DVT Previous history of unprovoked saddle PE in 2015, hx of recurrent unprovoked DVT with hx of thrombectomy in 01/2021. Has IVC filter placed, could not be removed previously d/t significant thrombus burden on filter. Denies any bleeding issues while on Coumadin.  Continue to follow-up with Coumadin clinic.  Has hx of recurrent left lower extremity DVT. Will refill Coumadin and will obtain CBC.    6. Pulmonary hypertension Etiology most likely d/t CHF. RHC revealed moderately elevated PA mean and PVR secondary to combined pre and postcapillary pulmonary hypertension, was recommended to increase outpatient diuretics and to start evaluation for mitral valve replacement/repair. Denies any recent [facility]. Continue Lasix, will arrange labs as mentioned above.    7. Severe mitral valve regurgitation, tricuspid regurgitation TTE 06/2022 revealed moderate to severe MR, moderate to severe TR.  Has upcoming appt with Dr. Excell Seltzer for evaluation. Continue current medication regimen. Obtaining labs as mentioned above. Heart healthy diet encouraged. Follow-up with Dr. Excell Seltzer as scheduled.   8. Obesity  Weight loss via diet and exercise as tolerated encouraged. Discussed the impact being overweight would have on cardiovascular risk.  9. Focal swelling along right chest wall Stable, noted below collar bone, said has been there for several months, stable since last OV. Previously arranged non-contrast CT scan of chest, pt agreeable to re-visit arranging imaging after further evaluation by Dr. Excell Seltzer.    Dispo: Follow-up with me or APP in 6-8 weeks or sooner if anything changes.   Signed, Sharlene Dory, NP

## 2022-07-24 ENCOUNTER — Ambulatory Visit: Payer: Self-pay | Admitting: *Deleted

## 2022-07-24 NOTE — Patient Outreach (Signed)
  Care Coordination   07/24/2022 Name: [name] [name] MRN: [id number] DOB: [birth year]   Care Coordination Outreach Attempts:  An unsuccessful telephone outreach was attempted for a scheduled appointment today.  Follow Up Plan:  Additional outreach attempts will be made to offer the patient care coordination information and services.   Encounter Outcome:  No Answer. Left HIPAA compliant VM. Advised that Misty Stanley will outreach to reschedule and provided my telephone number to return call if she needs resource or care coordination services before that appt. She Has an apt scheduled with Dr Excell Seltzer (cardio) in [place] on 07/30/22.   Care Coordination Interventions:  No, not indicated    Demetrios Loll, BSN, RN-BC RN Care Coordinator [facility]  [facility] Direct Dial: [phone] Main #: [phone]

## 2022-07-29 ENCOUNTER — Telehealth: Payer: Self-pay

## 2022-07-29 ENCOUNTER — Telehealth: Payer: Self-pay | Admitting: *Deleted

## 2022-07-29 NOTE — Telephone Encounter (Signed)
The patient is inappropriately scheduled with Dr. Excell Seltzer tomorrow. She has been hospitalized and needs to see CHF Clinic back and show compliance prior to seeing Dr. Excell Seltzer for evaluation of mTEER.   Left message for patient to call back.

## 2022-07-29 NOTE — Progress Notes (Signed)
  Care Coordination Note  07/29/2022 Name: [name] MRN: [id number] DOB: 10-05-1975  [name] [name] is a 48 y.o. year old female who is a primary care patient of Felecia Shelling, Wayland Salinas, MD and is actively engaged with the care management team. I reached out to [name] [name] by phone today to assist with re-scheduling an initial visit with the RN Case Manager  Follow up plan: Unsuccessful telephone outreach attempt made. A HIPAA compliant phone message was left for the patient providing contact information and requesting a return call.   [facility]  Care Coordination Care Guide  Direct Dial: [phone]

## 2022-07-30 ENCOUNTER — Ambulatory Visit: Payer: Medicare HMO | Admitting: Cardiovascular Disease

## 2022-07-30 NOTE — Telephone Encounter (Signed)
The patient reports she is late for Dr. Earmon Phoenix appointment and wants to know where he is located.  Apologized to the patient and informed her that she was attempted to be reached several times to cancel the appointment today, but she said she didn't listen to her messages. Reiterated to her that with her recent hospitalization and missed appointments with the Heart Failure Clinic, it is imperative that she reschedule her appointment with Dr. Gasper Lloyd or his team for medication optimization and compliance prior to being rescheduled with Dr. Excell Seltzer to discuss valve treatment options.  Attempted to give her the CHF phone number but she hung up.

## 2022-07-31 ENCOUNTER — Telehealth (HOSPITAL_COMMUNITY): Payer: Self-pay | Admitting: Vascular Surgery

## 2022-07-31 NOTE — Progress Notes (Signed)
  Care Coordination   Note   07/31/2022 Name: [name] [name] MRN: [id number] DOB: 19-Sep-1975  [name] [name] is a 47 y.o. year old female who sees Fanta, Wayland Salinas, MD for primary care. I reached out to [name] by phone today to offer care coordination services.  [name] [name] was given information about Care Coordination services today including:   The Care Coordination services include support from the care team which includes your Nurse Coordinator, Clinical Social Worker, or Pharmacist.  The Care Coordination team is here to help remove barriers to the health concerns and goals most important to you. Care Coordination services are voluntary, and the patient may decline or stop services at any time by request to their care team member.   Care Coordination Consent Status: Patient agreed to services and verbal consent obtained.   Follow up plan:  Telephone appointment with care coordination team member scheduled for:  08/06/22  Encounter Outcome:  Pt. Scheduled  [facility] Coordination Care Guide  Direct Dial: [phone]

## 2022-07-31 NOTE — Progress Notes (Signed)
  Care Coordination  Outreach Note  07/31/2022 Name: [name] [name] MRN: [id number] DOB: 04-22-1975   Care Coordination Outreach Attempts: A second unsuccessful outreach was attempted today to offer the patient with information about available care coordination services.  Follow Up Plan:  Additional outreach attempts will be made to offer the patient care coordination information and services.   Encounter Outcome:  No Answer   Gwenevere Ghazi  Care Coordination Care Guide  Direct Dial: [phone]

## 2022-07-31 NOTE — Telephone Encounter (Signed)
Lvm to make appt w/ AD

## 2022-08-02 NOTE — Telephone Encounter (Signed)
Second attempt to contact pt for f/u w/ AS

## 2022-08-05 ENCOUNTER — Encounter: Payer: Self-pay | Admitting: *Deleted

## 2022-08-05 ENCOUNTER — Ambulatory Visit: Payer: Self-pay | Admitting: *Deleted

## 2022-08-05 NOTE — Patient Outreach (Signed)
Care Coordination   Initial Visit Note   08/05/2022 Name: [name] [name] MRN: [id number] DOB: [birth year]  [name] [name] is a 47 y.o. year old female who sees Fanta, Wayland Salinas, MD for primary care. I spoke with  [name] by phone today.  What matters to the patients health and wellness today?  Managing blood pressure    Goals Addressed             This Visit's Progress    Manage Blood Pressure       Care Coordination Goals: Patient will take medications as directed and report any negative side effects to provider  Patient will clarify with cardiologist how and when to take midodrine Currently holding if blood pressure is "too low". Advised patient that midodrine is used to treat hypotension and she may have confused the instructions. Patient will use a pill box/organizer to help keep up with when to take medications Patient will monitor and record blood pressure daily and as needed and will call PCP or cardiologist with any readings outside of recommended range Patient will keep all recommended follow-up appointments with PCP and specialists Cardio on 08/08/22 Patient will take blood pressure log to PCP and specialty appointments for review Patient will follow a low sodium/DASH diet  Patient will reach out to RN Care Coordinator [phone] with any care coordination or resource needs       Manage Heart Failure and Edema       Care Coordination Goals: Patient will take medications as directed Recommended that she take the 2nd dose of lasix a little earlier in the evening. Currently taking at 7pm and has to get up during the night to urinate. Can take a few hours earlier to see if that helps. Patient will keep all medical appointments Cardio on 08/08/22 Patient will notify provider of any new or worsening symptoms Patient will monitor legs/feet for swelling and notify cardiologist of worsening symptoms Patient will check and record blood pressure daily and reach  out to provider with any readings outside of recommended range Patient will check and record weight each morning after urinating and will call cardiologist with weight gain of >2 lbs overnight of >5 lbs in one week Patient will follow a low sodium heart healthy diet Patient will follow fluid restriction on 1 liter per day Patient will review handout on fluid restriction and how to measure that Patient will call RN Care Coordinator [phone] with any resource or care coordination needs         SDOH assessments and interventions completed:  Yes  SDOH Interventions Today    Flowsheet Row Most Recent Value  SDOH Interventions   Housing Interventions Intervention Not Indicated  Transportation Interventions Intervention Not Indicated  Financial Strain Interventions Intervention Not Indicated  Social Connections Interventions Patient Declined        Care Coordination Interventions:  Yes, provided  Interventions Today    Flowsheet Row Most Recent Value  Chronic Disease   Chronic disease during today's visit Congestive Heart Failure (CHF), Hypertension (HTN)  General Interventions   General Interventions Discussed/Reviewed General Interventions Discussed, General Interventions Reviewed, Doctor Visits, Durable Medical Equipment (DME), Communication with  Demetrius Charity is stable and has not increased more than 2 lbs overnight since hospital dishcarge last month. Monitoring blood pressure daily. Averaging around 110/70.]  Doctor Visits Discussed/Reviewed Doctor Visits Discussed, Doctor Visits Reviewed, Specialist  Durable Medical Equipment (DME) BP Cuff, Other  [scales]  PCP/Specialist Visits Compliance with follow-up visit  [cardiology  08/08/22]  Communication with PCP/Specialists  Eyvonne Mechanic message to cardio, Sharlene Dory, NP. Asked her to clarify midodrine directions with patient.]  Exercise Interventions   Exercise Discussed/Reviewed Physical Activity  Physical Activity Discussed/Reviewed  Physical Activity Discussed, Physical Activity Reviewed  [Increase physical actiivty as tolerated with a goal of at least 150 min per week]  Education Interventions   Education Provided Provided Education, Provided Printed Education  Provided Verbal Education On Nutrition, When to see the doctor, Medication, Exercise  Nutrition Interventions   Nutrition Discussed/Reviewed Nutrition Discussed, Nutrition Reviewed, Decreasing salt, Fluid intake  [Watching sodium intake. Familiar with food labels. Fluid restriction of 1 liter per day. Handout given on counting fluid intake.]  Pharmacy Interventions   Pharmacy Dicussed/Reviewed Pharmacy Topics Discussed, Pharmacy Topics Reviewed, Medications and their functions  [Recommended taking 2nd dose of lasix a little earlier, arund 4pm instead of 7pm to dec urination at night. Advised that midodrine is for low blood pressure but she is currently holding it if her blood pressure is "too low". F/u w/ cardio re: directions]  Safety Interventions   Safety Discussed/Reviewed Safety Discussed, Safety Reviewed       Follow up plan: Follow up call scheduled for 08/20/22 at 2:30    Encounter Outcome:  Pt. Visit Completed   Demetrios Loll, BSN, RN-BC RN Care Coordinator [facility]  [facility] Direct Dial: [phone] Main #: [phone]

## 2022-08-08 ENCOUNTER — Ambulatory Visit: Payer: Medicare HMO | Admitting: Nurse Practitioner

## 2022-08-08 ENCOUNTER — Telehealth (HOSPITAL_COMMUNITY): Payer: Self-pay | Admitting: Vascular Surgery

## 2022-08-08 NOTE — Telephone Encounter (Signed)
3td attempt to contact pt to schedule f/u w/ AS

## 2022-08-12 ENCOUNTER — Encounter: Payer: Self-pay | Admitting: Nurse Practitioner

## 2022-08-14 ENCOUNTER — Ambulatory Visit: Payer: Medicare HMO | Attending: Cardiology | Admitting: *Deleted

## 2022-08-14 DIAGNOSIS — Z5181 Encounter for therapeutic drug level monitoring: Secondary | ICD-10-CM

## 2022-08-14 DIAGNOSIS — Z86718 Personal history of other venous thrombosis and embolism: Secondary | ICD-10-CM | POA: Diagnosis not present

## 2022-08-14 DIAGNOSIS — Z86711 Personal history of pulmonary embolism: Secondary | ICD-10-CM | POA: Diagnosis not present

## 2022-08-14 LAB — POCT INR: INR: 3.5 — AB (ref 2.0–3.0)

## 2022-08-14 MED ORDER — WARFARIN SODIUM 10 MG PO TABS
ORAL_TABLET | ORAL | 2 refills | Status: DC
Start: 2022-08-14 — End: 2022-12-30

## 2022-08-14 NOTE — Patient Instructions (Signed)
Just finished doxycycline Hold warfarin tonight then resume 1 tablet daily except 1/2 tablet on Sundays and Thursdays Recheck in 3 wks

## 2022-08-20 ENCOUNTER — Ambulatory Visit: Payer: Self-pay | Admitting: *Deleted

## 2022-08-21 ENCOUNTER — Encounter: Payer: Self-pay | Admitting: *Deleted

## 2022-08-21 NOTE — Patient Outreach (Signed)
Care Coordination   Follow Up Visit Note   08/20/2022 Name: [name] [name] MRN: [id number] DOB: 01-25-1975  [name] [name] is a 47 y.o. year old female who sees Fanta, Wayland Salinas, MD for primary care. I spoke with  [name] [name] by phone today.  What matters to the patients health and wellness today?  Managing heart conditions    Goals Addressed             This Visit's Progress    Manage Blood Pressure   On track    Care Coordination Goals: Patient will take medications as directed and report any negative side effects to provider  Patient will clarify with cardiologist how and when to take midodrine Currently holding if blood pressure is "too low". Advised patient that midodrine is used to treat hypotension and she may have confused the instructions. Patient will monitor and record blood pressure daily and as needed and will call PCP or cardiologist with any readings outside of recommended range Patient will keep all recommended follow-up appointments with PCP and specialists Patient will take blood pressure log to PCP and specialty appointments for review Patient will follow a low sodium/DASH diet  Patient will reach out to RN Care Coordinator [phone] with any care coordination or resource needs       Manage Heart Failure and Edema   On track    Care Coordination Goals: Patient will take medications as directed Patient will keep all medical appointments Patient will notify provider of any new or worsening symptoms Patient will monitor legs/feet for swelling and notify cardiologist of worsening symptoms Patient will check and record blood pressure daily and reach out to provider with any readings outside of recommended range Patient will check and record weight each morning after urinating and will call cardiologist with weight gain of >2 lbs overnight of >5 lbs in one week Patient will follow a low sodium heart healthy diet Patient will follow fluid restriction  on 1 liter per day Patient will review handout on fluid restriction and how to measure that Patient will have labs at [facility] that were ordered by cardiologist on 07/23/22 Patient will call RN Care Coordinator [phone] with any resource or care coordination needs         SDOH assessments and interventions completed:  Yes  SDOH Interventions Today    Flowsheet Row Most Recent Value  SDOH Interventions   Transportation Interventions Intervention Not Indicated  Financial Strain Interventions Intervention Not Indicated        Care Coordination Interventions:  Yes, provided  Interventions Today    Flowsheet Row Most Recent Value  Chronic Disease   Chronic disease during today's visit Congestive Heart Failure (CHF), Hypertension (HTN)  General Interventions   General Interventions Discussed/Reviewed Communication with, General Interventions Discussed, General Interventions Reviewed, Durable Medical Equipment (DME), Labs, Doctor Visits  Labs --  [Due for CBC, BNP, Magnesium, Hepatic Function Panel at [facility] per cardiology orders]  Doctor Visits Discussed/Reviewed Doctor Visits Discussed, Doctor Visits Reviewed, Specialist  Durable Medical Equipment (DME) BP Cuff  [Scales. BP 121/79 and weight stable at 252 lbs]  PCP/Specialist Visits Compliance with follow-up visit  [08/28/22  Dr Gasper Lloyd ([facility])  09/06/22 Sharlene Dory, NP (cardiology)]  Communication with PCP/Specialists  [phone] message to cardio, Sharlene Dory, NP. Asked her to clarify midodrine directions with patient.]  Exercise Interventions   Exercise Discussed/Reviewed Physical Activity, Exercise Reviewed, Exercise Discussed  Physical Activity Discussed/Reviewed Physical Activity Discussed, Physical Activity Reviewed  Jaquita Folds  as tolerated with a goal of at least 150 min per week]  Education Interventions   Education Provided Provided Education  Provided Verbal Education On Nutrition, When to see the doctor,  Medication, Exercise  Nutrition Interventions   Nutrition Discussed/Reviewed Nutrition Discussed, Nutrition Reviewed, Fluid intake, Decreasing salt  [Watching sodium intake. Familiar with food labels. Fluid restriction of 1 liter per day. Handout given on counting fluid intake.]  Pharmacy Interventions   Pharmacy Dicussed/Reviewed Pharmacy Topics Discussed, Pharmacy Topics Reviewed, Medications and their functions  [taking 2nd dose of lasix a little earlier in the evening. This has helped nocturia. Advised that midodrine is for low blood pressure but she is currently holding it if her blood pressure is "too low". F/u w/ cardiology re: directions]       Follow up plan: Follow up call scheduled for 09/05/22    Encounter Outcome:  Pt. Visit Completed   Demetrios Loll, BSN, RN-BC RN Care Coordinator [facility]  [facility] Direct Dial: [phone] Main #: [phone]

## 2022-08-23 ENCOUNTER — Other Ambulatory Visit: Payer: Self-pay | Admitting: Nurse Practitioner

## 2022-08-23 MED ORDER — MIDODRINE HCL 2.5 MG PO TABS: ORAL_TABLET | ORAL | 6 refills | Status: DC

## 2022-08-23 NOTE — Telephone Encounter (Signed)
FIXED

## 2022-08-28 ENCOUNTER — Encounter (HOSPITAL_COMMUNITY): Payer: Self-pay

## 2022-08-28 ENCOUNTER — Encounter (HOSPITAL_COMMUNITY): Payer: Medicare HMO | Admitting: Cardiology

## 2022-09-05 ENCOUNTER — Ambulatory Visit: Payer: Self-pay | Admitting: *Deleted

## 2022-09-05 NOTE — Patient Outreach (Signed)
  Care Coordination   09/05/2022 Name: [name] [name] MRN: [id number] DOB: Apr 03, 1975   Care Coordination Outreach Attempts:  An unsuccessful telephone outreach was attempted for a scheduled appointment today.  Follow Up Plan:  Additional outreach attempts will be made to offer the patient care coordination information and services.   Encounter Outcome:  No Answer   Care Coordination Interventions:  No, not indicated    Demetrios Loll, BSN, RN-BC RN Care Coordinator [facility]  [facility] Direct Dial: [phone] Main #: [phone]

## 2022-09-06 ENCOUNTER — Ambulatory Visit: Payer: Medicare HMO | Attending: Nurse Practitioner | Admitting: Nurse Practitioner

## 2022-09-06 ENCOUNTER — Other Ambulatory Visit (HOSPITAL_COMMUNITY)
Admission: RE | Admit: 2022-09-06 | Discharge: 2022-09-06 | Disposition: A | Discharge status: Home / Self Care | Payer: Medicare HMO | Source: Ambulatory Visit | Attending: Nurse Practitioner | Admitting: Nurse Practitioner

## 2022-09-06 ENCOUNTER — Encounter: Payer: Self-pay | Admitting: Nurse Practitioner

## 2022-09-06 VITALS — BP 102/68 | HR 59 | Ht 70.0 in | Wt 244.6 lb

## 2022-09-06 DIAGNOSIS — I471 Supraventricular tachycardia, unspecified: Secondary | ICD-10-CM

## 2022-09-06 DIAGNOSIS — I340 Nonrheumatic mitral (valve) insufficiency: Secondary | ICD-10-CM

## 2022-09-06 DIAGNOSIS — E669 Obesity, unspecified: Secondary | ICD-10-CM | POA: Diagnosis not present

## 2022-09-06 DIAGNOSIS — I361 Nonrheumatic tricuspid (valve) insufficiency: Secondary | ICD-10-CM

## 2022-09-06 DIAGNOSIS — I10 Essential (primary) hypertension: Secondary | ICD-10-CM

## 2022-09-06 DIAGNOSIS — Z01812 Encounter for preprocedural laboratory examination: Secondary | ICD-10-CM | POA: Diagnosis not present

## 2022-09-06 DIAGNOSIS — Z86711 Personal history of pulmonary embolism: Secondary | ICD-10-CM | POA: Diagnosis not present

## 2022-09-06 DIAGNOSIS — Z86718 Personal history of other venous thrombosis and embolism: Secondary | ICD-10-CM

## 2022-09-06 DIAGNOSIS — Z87898 Personal history of other specified conditions: Secondary | ICD-10-CM

## 2022-09-06 DIAGNOSIS — I2510 Atherosclerotic heart disease of native coronary artery without angina pectoris: Secondary | ICD-10-CM | POA: Diagnosis not present

## 2022-09-06 DIAGNOSIS — I5042 Chronic combined systolic (congestive) and diastolic (congestive) heart failure: Secondary | ICD-10-CM | POA: Diagnosis not present

## 2022-09-06 DIAGNOSIS — I272 Pulmonary hypertension, unspecified: Secondary | ICD-10-CM

## 2022-09-06 DIAGNOSIS — R222 Localized swelling, mass and lump, trunk: Secondary | ICD-10-CM

## 2022-09-06 DIAGNOSIS — Z79899 Other long term (current) drug therapy: Secondary | ICD-10-CM

## 2022-09-06 DIAGNOSIS — I4729 Other ventricular tachycardia: Secondary | ICD-10-CM

## 2022-09-06 LAB — BASIC METABOLIC PANEL
Anion gap: 14 (ref 5–15)
BUN: 21 mg/dL — ABNORMAL HIGH (ref 6–20)
CO2: 27 mmol/L (ref 22–32)
Calcium: 9.2 mg/dL (ref 8.9–10.3)
Chloride: 96 mmol/L — ABNORMAL LOW (ref 98–111)
Creatinine, Ser: 1.11 mg/dL — ABNORMAL HIGH (ref 0.44–1.00)
GFR, Estimated: >60 mL/min (ref >60)
Glucose, Bld: 130 mg/dL — ABNORMAL HIGH (ref 70–99)
Potassium: 5 mmol/L (ref 3.5–5.1)
Sodium: 137 mmol/L (ref 135–145)

## 2022-09-06 NOTE — Patient Instructions (Signed)
Medication Instructions:   Continue all current medications.   Labwork:  BMET - do just prior to CT [facility]) HFP, MG, BNP, CBC ([facility]) - can do these labs with pcp  Testing/Procedures:  CT of chest / neck with contrast   Follow-Up:  6-8 weeks    Any Other Special Instructions Will Be Listed Below (If Applicable). You have been referred to Structural Heart Team (Dr. Earmon [place] office).  If you need a refill on your cardiac medications before your next appointment, please call your pharmacy.

## 2022-09-06 NOTE — Progress Notes (Unsigned)
Cardiology Office Note:  .   Date:  09/06/2022 ID:  [name] [name], DOB 03-03-1975, MRN [id number] PCP: Benetta Spar, MD  [facility] Providers Cardiologist:  Dina Rich, MD Electrophysiologist:  Maurice Small, MD    History of Present Illness: .   [name] [name] is a 47 y.o. female with a PMH of nonobstructive CAD, chronic combined CHF (NICM), hx of unprovoked saddle PE in 2015, s/p IVC filter, recurrent unprovoked DVT, s/p thrombectomy, HTN, NSVT, OSA on CPAP, pulmonary HTN, hx of MR and TR, hx of syncope, obesity, and chronic anemia, who presents today for scheduled folllow-up.   Last saw patient on May 21, 2022. She noticed weight was up and increased edema, lasix dosage was increased.   Hospital admission 06/2022 for acute on chronic combined CHF exacerbation. Repeat Echo EF 25-30% from 30-35%, grade 3 DD, moderate to severe MR.   I last saw patient for follow-up on July 23, 2022. Was doing much better since leaving the hospital. Weight at baseline. Compliant with her medications. Overall denied any shortness of breath, palpitations, syncope, presyncope, dizziness, orthopnea, PND, swelling or significant weight changes, acute bleeding, or claudication. Did note some soreness along her chest at times, but denied any anginal chest pain symptoms.   Today she presents for follow-up. Doing well. Says she will need a repeat referral sent to structural heart team regarding her mitral/tricuspid valve. Says area along right upper chest is getting more noticeable and bigger in size as she is losing weight. Has lost almost 10 lbs since I last saw her from watching what she is eating. Denies any chest pain, shortness of breath, palpitations, syncope, presyncope, dizziness, orthopnea, PND, swelling or significant weight changes, acute bleeding, or claudication.   Studies Reviewed: .    Echo 06/2022:  1. Left ventricular ejection fraction, by estimation, is 25 to 30%. The   left ventricle has severely decreased function. The left ventricle  demonstrates regional wall motion abnormalities (see scoring  diagram/findings for description). The left  ventricular internal cavity size was severely dilated. Left ventricular  diastolic parameters are consistent with Grade III diastolic dysfunction  (restrictive). Elevated left atrial pressure.   2. Ventricular septum is flattened in systole suggesting RV pressure  overload. . Right ventricular systolic function is mildly reduced. The  right ventricular size is normal. There is moderately elevated pulmonary  artery systolic pressure.   3. Left atrial size was severely dilated.   4. Functional eccentric posterior directed MR difficult to quantify, at  least moderate possibly severe MR. MV/AV VTI ratio 1.3 suggesting moderate  to severe MR. The mitral valve is abnormal. Moderate to severe mitral  valve regurgitation. No evidence of  mitral stenosis.   5. The tricuspid valve is abnormal. Tricuspid valve regurgitation is  moderate to severe.   6. The aortic valve is tricuspid. There is mild calcification of the  aortic valve. There is mild thickening of the aortic valve. Aortic valve  regurgitation is not visualized. No aortic stenosis is present.  Right heart cath 02/2022:  IMPRESSION: Severely elevated pre and post capillary filling pressures.  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    RECOMMENDATIONS: - Increase outpatient diuretics.  - Start evaluation for mitral valve repair/replacement.   TEE 02/2022:  1. Left ventricular ejection fraction, by estimation, is 35%. The left  ventricle has moderately decreased function. The left ventricular internal  cavity size was moderately to severely dilated measuring 7.1cm.   2. Right ventricular systolic function is moderately reduced. The right  ventricular size  is mildly enlarged.   3. Left atrial size was moderately dilated. No left atrial/left atrial  appendage thrombus was detected.   4. Right atrial size was mildly dilated.   5. There is severe posteriorly directed eccentric mitral valve  regurgitation secondary to restriction of the posterior mitral valve  leaflet and poor leaflet coaptation. There is blunting of right upper  pulmonary vein flow. BP during study 100s/60s.   6. Tricuspid valve regurgitation is mild to moderate.   7. The aortic valve is normal in structure. Aortic valve regurgitation is  not visualized.     Physical Exam:   VS:  BP 102/68   Pulse (!) 59   Ht 5\' 10"  (1.778 m)   Wt 244 lb 9.6 oz (110.9 kg)   SpO2 97%   BMI 35.10 kg/m    Wt Readings from Last 3 Encounters:  09/06/22 244 lb 9.6 oz (110.9 kg)  07/23/22 253 lb 3.2 oz (114.9 kg)  07/09/22 262 lb 12.6 oz (119.2 kg)    GEN: Obese, 47 y.o. female in no acute distress NECK: No JVD; No carotid bruits CARDIAC: S1/S2, RRR, Grade 2/6 murmur, no rubs, no gallops, Focal swelling noted to right chest wall under collar bone, enlarged and more prominent since last OV.  RESPIRATORY:  Clear to auscultation without rales, wheezing or rhonchi  ABDOMEN: Soft, non-tender, non-distended EXTREMITIES:  Generalized nonpitting edema; No deformity   ASSESSMENT AND PLAN: .    Chronic combined CHF, medication management Stage C, NYHA class I-II symptoms. TTE 06/2022 EF 25-30%, from 30-35%, grade 3 DD. Euvolemic and well compensated on exam.  Cardiac MRI has been arranged and ordered previously. Continue current GDMT, unable to further titrate d/t current BP. Low sodium diet, fluid restriction <2L, and daily weights encouraged. Educated to contact our office for weight gain of 2 lbs overnight or 5 lbs in one week.  Continue to follow-up with HF clinic. Will obtain BMET, Mag, and BNP in 1 week (were not obtained last visit) when she follows-up with PCP.    Hx of syncope, hx of  NSVT Etiology felt to be vasovagal. Denies any recurrence or symptoms. Past monitor revealed NSVT/PVC's. Continue current medication regimen.  Cardiac MRI has been previously arranged as mentioned above.  Heart healthy diet recommended. Continue to follow-up with EP.    CAD Stable with no anginal symptoms. No indication for ischemic evaluation.  Previous cardiac catheterization 05/2021 revealed nonobstructive CAD.  Not on aspirin due to being on Coumadin.  Continue atorvastatin, losartan, and Toprol-XL.  Heart healthy diet recommended.   HTN Blood pressure chronically soft, takes midodrine PRN for SBP < 90. Continue current medication regimen.   Discussed to monitor BP at home at least 2 hours after medications and sitting for 5-10 minutes. Heart healthy diet recommended.    Hx of PE/DVT Previous history of unprovoked saddle PE in 2015, hx of recurrent unprovoked DVT with hx of thrombectomy in 01/2021. Has IVC filter placed, could not be removed previously d/t significant thrombus burden on filter. Denies any bleeding issues while on Coumadin.  Continue current medication regimen and continue to follow-up with Coumadin clinic.  Has hx of recurrent left lower extremity DVT.    6. Pulmonary hypertension Etiology most likely d/t CHF. RHC revealed moderately elevated PA mean and PVR secondary to combined pre and postcapillary pulmonary hypertension,  was recommended to increase outpatient diuretics and to start evaluation for mitral valve replacement/repair. Denies any recent [facility] Continue Lasix, will arrange labs as mentioned above.    7. Severe mitral valve regurgitation, tricuspid regurgitation TTE 06/2022 revealed moderate to severe MR, moderate to severe TR.  Will send in referral for structural heart team. Continue current medication regimen. Obtaining labs as mentioned above. Heart healthy diet encouraged.    8. Obesity  Weight loss via diet and exercise as tolerated encouraged. Discussed the  impact being overweight would have on cardiovascular risk. Has lost almost 10 lbs since I last saw her. Congratulated her on her weight loss.   9. Focal swelling along right chest wall, pre-procedure lab More pronounced and noticeable since last OV, noted below collar bone, has been there for several months. Will obtain CT of chest/neck for further evaluation and will obtain BMET prior to imaging.     Dispo: Will obtain labs that were not obtained from previous OV. Follow-up with me or APP in 6-8 weeks or sooner if anything changes.   Signed, Sharlene Dory, NP

## 2022-09-09 ENCOUNTER — Encounter (HOSPITAL_COMMUNITY): Payer: Self-pay | Admitting: Hematology

## 2022-09-13 ENCOUNTER — Ambulatory Visit (INDEPENDENT_AMBULATORY_CARE_PROVIDER_SITE_OTHER): Payer: Medicare HMO | Admitting: Family Medicine

## 2022-09-13 ENCOUNTER — Encounter: Payer: Self-pay | Admitting: Family Medicine

## 2022-09-13 ENCOUNTER — Telehealth: Payer: Self-pay | Admitting: *Deleted

## 2022-09-13 ENCOUNTER — Encounter (HOSPITAL_COMMUNITY): Payer: Self-pay | Admitting: Hematology

## 2022-09-13 VITALS — BP 108/74 | HR 73 | Ht 71.0 in | Wt 240.1 lb

## 2022-09-13 DIAGNOSIS — D509 Iron deficiency anemia, unspecified: Secondary | ICD-10-CM

## 2022-09-13 DIAGNOSIS — M5136 Other intervertebral disc degeneration, lumbar region: Secondary | ICD-10-CM | POA: Diagnosis not present

## 2022-09-13 DIAGNOSIS — M545 Low back pain, unspecified: Secondary | ICD-10-CM | POA: Diagnosis not present

## 2022-09-13 DIAGNOSIS — Z1329 Encounter for screening for other suspected endocrine disorder: Secondary | ICD-10-CM

## 2022-09-13 DIAGNOSIS — E559 Vitamin D deficiency, unspecified: Secondary | ICD-10-CM

## 2022-09-13 DIAGNOSIS — E785 Hyperlipidemia, unspecified: Secondary | ICD-10-CM | POA: Diagnosis not present

## 2022-09-13 DIAGNOSIS — E538 Deficiency of other specified B group vitamins: Secondary | ICD-10-CM | POA: Diagnosis not present

## 2022-09-13 DIAGNOSIS — I10 Essential (primary) hypertension: Secondary | ICD-10-CM | POA: Diagnosis not present

## 2022-09-13 DIAGNOSIS — Z131 Encounter for screening for diabetes mellitus: Secondary | ICD-10-CM

## 2022-09-13 DIAGNOSIS — Z7689 Persons encountering health services in other specified circumstances: Secondary | ICD-10-CM

## 2022-09-13 MED ORDER — CYCLOBENZAPRINE HCL 10 MG PO TABS: 10.0000 mg | ORAL_TABLET | Freq: Three times a day (TID) | ORAL | 0 refills | Status: DC | PRN

## 2022-09-13 MED ORDER — PREDNISONE 20 MG PO TABS: 20.0000 mg | ORAL_TABLET | Freq: Two times a day (BID) | ORAL | 0 refills | Status: AC

## 2022-09-13 NOTE — Assessment & Plan Note (Signed)
Prednisone 20 mg twice a day x 5 days Flexeril 10 mg PRN Advise to follow up with Orthopedics  Explained to patient Non pharmacological interventions include the use of ice or heat, rest, recommend range of motion exercises, gentle stretching. Follow up for worsening or persistent symptoms. Patient verbalizes understanding regarding plan of care and all questions answered.

## 2022-09-13 NOTE — Progress Notes (Signed)
  Care Coordination Note  09/13/2022 Name: [name] [name] MRN: [id number] DOB: 1975/05/01  [name] [name] is a 47 y.o. year old female who is a primary care patient of Felecia Shelling, Wayland Salinas, MD and is actively engaged with the care management team. I reached out to [name] [name] by phone today to assist with re-scheduling an initial visit with the RN Case Manager  Follow up plan: Unsuccessful telephone outreach attempt made.   [facility]  Care Coordination Care Guide  Direct Dial: [phone]

## 2022-09-13 NOTE — Progress Notes (Signed)
New Patient Office Visit   Subjective   Patient ID: [name] [name], female    DOB: [birth year]  Age: 47 y.o. MRN: [id number]  CC:  Chief Complaint  Patient presents with   Establish Care    Management of care   Back Pain    Would like to start care management of throbbing, piercing lower back pain that radiates down to the lft.  Leg. Pain has been coming and going for 5+ years.    HPI [name] [name] 47 year old female, presents to establish care. She  has a past medical history of Anemia, Blood transfusion without reported diagnosis, CAD (coronary artery disease), Closed left ankle fracture, Clotting disorder (HCC), DVT (deep venous thrombosis) (HCC), Fibroid tumor, HFrEF (heart failure with reduced ejection fraction) (HCC), Hidradenitis, Hypertension, Mitral regurgitation, Myocardial infarction (HCC), NICM (nonischemic cardiomyopathy) (HCC), NSVT (nonsustained ventricular tachycardia) (HCC), Obesity, OSA on CPAP, Pulmonary embolism (HCC) (04/2013), and Sleep apnea.  Back Pain This is a chronic problem. The problem occurs constantly. The problem has been gradually worsening since onset. The pain is present in the lumbar spine. The quality of the pain is described as shooting, aching and stabbing. The pain radiates to the left thigh. The pain is at a severity of 9/10. The pain is The same all the time. The symptoms are aggravated by lying down, sitting, position and bending. Associated symptoms include leg pain. Pertinent negatives include no bladder incontinence, bowel incontinence, numbness, pelvic pain, tingling or weakness. Risk factors include obesity. She has tried heat and ice for the symptoms. The treatment provided mild relief.      Outpatient Encounter Medications as of 09/13/2022  Medication Sig   acetaminophen (TYLENOL) 325 MG tablet Take 2 tablets (650 mg total) by mouth every 4 (four) hours as needed for headache or mild pain.   albuterol (VENTOLIN HFA) 108 (90 Base) MCG/ACT  inhaler Inhale 2 puffs into the lungs every 2 (two) hours as needed for wheezing or shortness of breath.   atorvastatin (LIPITOR) 20 MG tablet Take 1 tablet (20 mg total) by mouth daily.   benzonatate (TESSALON) 100 MG capsule Take 1 capsule (100 mg total) by mouth 3 (three) times daily as needed for cough.   cyclobenzaprine (FLEXERIL) 10 MG tablet Take 1 tablet (10 mg total) by mouth 3 (three) times daily as needed for muscle spasms.   empagliflozin (JARDIANCE) 10 MG TABS tablet Take 1 tablet (10 mg total) by mouth daily before breakfast.   furosemide (LASIX) 40 MG tablet Take 2 tablets (80 mg total) by mouth 2 (two) times daily.   losartan (COZAAR) 25 MG tablet Take 0.5 tablets (12.5 mg total) by mouth daily.   metoprolol succinate (TOPROL-XL) 25 MG 24 hr tablet Take 1 tablet (25 mg total) by mouth daily. Take with or immediately following a meal.   midodrine (PROAMATINE) 2.5 MG tablet Take 1 tablet (2.5 mg total) by mouth 2 (two) times daily as needed with a meal for Low BP   potassium chloride SA (KLOR-CON M) 20 MEQ tablet Take 20 mEq by mouth 2 (two) times daily.   predniSONE (DELTASONE) 20 MG tablet Take 1 tablet (20 mg total) by mouth 2 (two) times daily with a meal for 5 days.   spironolactone (ALDACTONE) 25 MG tablet Take 1 tablet (25 mg total) by mouth daily.   warfarin (COUMADIN) 10 MG tablet Take 1/2 tablet to 1 tablet daily or as directed by coumadin clinic.   Facility-Administered Encounter Medications as  of 09/13/2022  Medication   0.9 %  sodium chloride infusion    Past Surgical History:  Procedure Laterality Date   ABSCESS DRAINAGE Bilateral 01/10/2015   BREAST BIOPSY Right 02/01/2020   benign   COLONOSCOPY WITH PROPOFOL N/A 06/08/2018   Procedure: COLONOSCOPY WITH PROPOFOL;  Surgeon: Corbin Ade, MD;  Location: [facility];  Service: Endoscopy;  Laterality: N/A;  2:30pm   CYST REMOVAL TRUNK     IVC FILTER PLACEMENT ([facility] HX)     LOWER EXTREMITY VENOGRAPHY N/A  02/07/2021   Procedure: LOWER EXTREMITY VENOGRAPHY;  Surgeon: Victorino Sparrow, MD;  Location: [facility] INVASIVE CV LAB;  Service: Cardiovascular;  Laterality: N/A;   PERIPHERAL VASCULAR THROMBECTOMY N/A 02/07/2021   Procedure: PERIPHERAL VASCULAR THROMBECTOMY;  Surgeon: Victorino Sparrow, MD;  Location: [facility] INVASIVE CV LAB;  Service: Cardiovascular;  Laterality: N/A;   RIGHT HEART CATH N/A 03/08/2022   Procedure: RIGHT HEART CATH;  Surgeon: Dorthula Nettles, DO;  Location: [facility] INVASIVE CV LAB;  Service: Cardiovascular;  Laterality: N/A;   RIGHT/LEFT HEART CATH AND CORONARY ANGIOGRAPHY N/A 06/19/2021   Procedure: RIGHT/LEFT HEART CATH AND CORONARY ANGIOGRAPHY;  Surgeon: Lyn Records, MD;  Location: [facility] INVASIVE CV LAB;  Service: Cardiovascular;  Laterality: N/A;   TEE WITHOUT CARDIOVERSION N/A 03/08/2022   Procedure: TRANSESOPHAGEAL ECHOCARDIOGRAM (TEE);  Surgeon: Dorthula Nettles, DO;  Location: [facility] ENDOSCOPY;  Service: Cardiovascular;  Laterality: N/A;    Review of Systems  Gastrointestinal:  Negative for bowel incontinence.  Genitourinary:  Negative for bladder incontinence and pelvic pain.  Musculoskeletal:  Positive for back pain.  Neurological:  Negative for tingling, weakness and numbness.      Objective    BP 108/74 (BP Location: Right Arm, Patient Position: Sitting, Cuff Size: Large)   Pulse 73   Ht 5\' 11"  (1.803 m)   Wt 240 lb 1.3 oz (108.9 kg)   SpO2 93%   BMI 33.48 kg/m   Physical Exam Vitals reviewed.  Constitutional:      General: She is not in acute distress.    Appearance: Normal appearance. She is not ill-appearing, toxic-appearing or diaphoretic.  HENT:     Head: Normocephalic.  Eyes:     General:        Right eye: No discharge.        Left eye: No discharge.     Conjunctiva/sclera: Conjunctivae normal.  Cardiovascular:     Rate and Rhythm: Normal rate.     Pulses: Normal pulses.     Heart sounds: Normal heart sounds.  Pulmonary:     Effort: Pulmonary effort is  normal. No respiratory distress.     Breath sounds: Normal breath sounds.  Abdominal:     General: Bowel sounds are normal.     Palpations: Abdomen is soft.     Tenderness: There is no abdominal tenderness. There is no right CVA tenderness, left CVA tenderness or guarding.  Musculoskeletal:     Cervical back: Normal range of motion.     Lumbar back: Positive right straight leg raise test and positive left straight leg raise test.  Skin:    General: Skin is warm and dry.     Capillary Refill: Capillary refill takes less than 2 seconds.  Neurological:     General: No focal deficit present.     Mental Status: She is alert and oriented to person, place, and time.     Coordination: Coordination normal.     Gait: Gait normal.  Psychiatric:  Mood and Affect: Mood normal.        Behavior: Behavior normal.       Assessment & Plan:  Lumbar pain -     Cyclobenzaprine HCl; Take 1 tablet (10 mg total) by mouth 3 (three) times daily as needed for muscle spasms.  Dispense: 30 tablet; Refill: 0 -     predniSONE; Take 1 tablet (20 mg total) by mouth 2 (two) times daily with a meal for 5 days.  Dispense: 10 tablet; Refill: 0  Screening for diabetes mellitus -     Microalbumin / creatinine urine ratio -     Hemoglobin A1c  Vitamin D deficiency -     VITAMIN D 25 Hydroxy (Vit-D Deficiency, Fractures)  Screening for hypothyroidism -     TSH + free T4  Primary hypertension -     CBC with Differential/Platelet -     BMP8+eGFR  Hyperlipidemia, unspecified hyperlipidemia type -     Lipid panel  Vitamin B12 deficiency -     B12 and Folate Panel  Iron deficiency anemia, unspecified iron deficiency anemia type -     Iron, TIBC and Ferritin Panel  Referral of patient -     Ambulatory referral to Obstetrics / Gynecology  Lumbar degenerative disc disease Assessment & Plan: Prednisone 20 mg twice a day x 5 days Flexeril 10 mg PRN Advise to follow up with Orthopedics  Explained to  patient Non pharmacological interventions include the use of ice or heat, rest, recommend range of motion exercises, gentle stretching. Follow up for worsening or persistent symptoms. Patient verbalizes understanding regarding plan of care and all questions answered.      Return in about 6 weeks (around 10/25/2022), or if symptoms worsen or fail to improve, for Pap smear.   Cruzita Lederer Newman Nip, FNP

## 2022-09-13 NOTE — Patient Instructions (Signed)

## 2022-09-16 ENCOUNTER — Ambulatory Visit (HOSPITAL_COMMUNITY)
Admission: RE | Admit: 2022-09-16 | Discharge: 2022-09-16 | Disposition: A | Discharge status: Home / Self Care | Payer: Medicare HMO | Source: Ambulatory Visit | Attending: Cardiology | Admitting: Cardiology

## 2022-09-16 ENCOUNTER — Encounter (HOSPITAL_COMMUNITY): Payer: Self-pay | Admitting: Cardiology

## 2022-09-16 VITALS — BP 102/66 | HR 65 | Wt 241.8 lb

## 2022-09-16 DIAGNOSIS — I5042 Chronic combined systolic (congestive) and diastolic (congestive) heart failure: Secondary | ICD-10-CM

## 2022-09-16 DIAGNOSIS — D6851 Activated protein C resistance: Secondary | ICD-10-CM | POA: Diagnosis not present

## 2022-09-16 DIAGNOSIS — I10 Essential (primary) hypertension: Secondary | ICD-10-CM | POA: Diagnosis not present

## 2022-09-16 DIAGNOSIS — Z79899 Other long term (current) drug therapy: Secondary | ICD-10-CM | POA: Diagnosis not present

## 2022-09-16 DIAGNOSIS — I110 Hypertensive heart disease with heart failure: Secondary | ICD-10-CM | POA: Diagnosis not present

## 2022-09-16 DIAGNOSIS — N920 Excessive and frequent menstruation with regular cycle: Secondary | ICD-10-CM | POA: Insufficient documentation

## 2022-09-16 DIAGNOSIS — Z86718 Personal history of other venous thrombosis and embolism: Secondary | ICD-10-CM

## 2022-09-16 DIAGNOSIS — F17210 Nicotine dependence, cigarettes, uncomplicated: Secondary | ICD-10-CM | POA: Diagnosis not present

## 2022-09-16 DIAGNOSIS — Z6833 Body mass index (BMI) 33.0-33.9, adult: Secondary | ICD-10-CM | POA: Insufficient documentation

## 2022-09-16 DIAGNOSIS — Z7901 Long term (current) use of anticoagulants: Secondary | ICD-10-CM | POA: Insufficient documentation

## 2022-09-16 DIAGNOSIS — D6852 Prothrombin gene mutation: Secondary | ICD-10-CM | POA: Diagnosis not present

## 2022-09-16 DIAGNOSIS — E785 Hyperlipidemia, unspecified: Secondary | ICD-10-CM | POA: Diagnosis not present

## 2022-09-16 DIAGNOSIS — Z1329 Encounter for screening for other suspected endocrine disorder: Secondary | ICD-10-CM | POA: Diagnosis not present

## 2022-09-16 DIAGNOSIS — I2729 Other secondary pulmonary hypertension: Secondary | ICD-10-CM | POA: Diagnosis not present

## 2022-09-16 DIAGNOSIS — I2510 Atherosclerotic heart disease of native coronary artery without angina pectoris: Secondary | ICD-10-CM

## 2022-09-16 DIAGNOSIS — E538 Deficiency of other specified B group vitamins: Secondary | ICD-10-CM | POA: Diagnosis not present

## 2022-09-16 DIAGNOSIS — I471 Supraventricular tachycardia, unspecified: Secondary | ICD-10-CM | POA: Diagnosis not present

## 2022-09-16 DIAGNOSIS — I340 Nonrheumatic mitral (valve) insufficiency: Secondary | ICD-10-CM

## 2022-09-16 DIAGNOSIS — Z86711 Personal history of pulmonary embolism: Secondary | ICD-10-CM | POA: Insufficient documentation

## 2022-09-16 DIAGNOSIS — Z131 Encounter for screening for diabetes mellitus: Secondary | ICD-10-CM | POA: Diagnosis not present

## 2022-09-16 DIAGNOSIS — E559 Vitamin D deficiency, unspecified: Secondary | ICD-10-CM | POA: Diagnosis not present

## 2022-09-16 DIAGNOSIS — I272 Pulmonary hypertension, unspecified: Secondary | ICD-10-CM | POA: Diagnosis not present

## 2022-09-16 DIAGNOSIS — D509 Iron deficiency anemia, unspecified: Secondary | ICD-10-CM | POA: Diagnosis not present

## 2022-09-16 LAB — BASIC METABOLIC PANEL
Anion gap: 10 (ref 5–15)
BUN: 21 mg/dL — ABNORMAL HIGH (ref 6–20)
CO2: 27 mmol/L (ref 22–32)
Calcium: 9.4 mg/dL (ref 8.9–10.3)
Chloride: 98 mmol/L (ref 98–111)
Creatinine, Ser: 1.13 mg/dL — ABNORMAL HIGH (ref 0.44–1.00)
GFR, Estimated: >60 mL/min (ref >60)
Glucose, Bld: 84 mg/dL (ref 70–99)
Potassium: 4.8 mmol/L (ref 3.5–5.1)
Sodium: 135 mmol/L (ref 135–145)

## 2022-09-16 LAB — BRAIN NATRIURETIC PEPTIDE: B Natriuretic Peptide: 1157.1 pg/mL — ABNORMAL HIGH (ref 0.0–100.0)

## 2022-09-16 NOTE — H&P (View-Only) (Signed)
ADVANCED HEART FAILURE CLINIC NOTE  Referring Physician: Benetta Spar*  Primary Care: Rica Records, FNP Primary Cardiologist: Dina Rich, MD EP: York Pellant, MD  HPI: [name] [name] is a 47 y.o. female with hx of PE (2015), unprovoked DVT, HFrEF, pancytopenia, nonobstructive CAD, with frequent admissions for shortness of breath 2/2 HFrEF exacerbations, morbid obesity presenting today to establish care.   According to [name] [name] her functional decline and most of her cardiac history dates back to 2014/2015 when she was diagnosed with unprovoked DVT & PE. Based on chart review, she had a saddle PE extending from the distal main PA to left and right pulmonary arteries w extension to subsegmental arteries. Prior to that she was fairly ambulatory. After her PE she noticed a significant decline in ability to ambulate leading to further weight gain. She was seen by [facility] hematology where hypercoagulable work-up was negative and she was started on life long anticoagulation. In the interim she also had an IVC filter placed that later could not be retrieved due to extensive thrombus burden (2015). Over the past 1 year she has had fairly poor functional status. She has a 6 year daughter that she is trying to take care of but becomes dyspneic after walking > 10-15 feet. In 5/23 she was admitted with acute hypoxic respiratory failure requiring intubation. LHC during that admit w/ nonobstructive CAD. She was admitted again in 9/23 with syncope, nausea and C2 fracture managed conservatively. LVEF during that admit noted to be 35% w/ severe posterior MR. Otherwise she had one additional hospitalization for acute on chronic HFrEF exacerbation. In addition, she is struggling with pancytopenia; anemia believed to be secondary to severe menorrhagia (followed by hematology).   Interval hx:  Since our last visit, [name] [name] has missed x2 structural appointments and has been hospitalized for  decompensated heart failure. TTE during admission in 6/24 with further dilation of her LV and severe MR. From a functional standpoint her symptoms are most consistent with NYHA III. She has days where she is very fatigued with difficulty walking more than 6ft. She can still perform ADLs independently; go to the grocery store and shop without difficulty but overall continues to have a steady decline in function. She continues to smoke but is working on stopping.   Activity level/exercise tolerance:  NYHA III Orthopnea:  2 pillow orthopnea Paroxysmal noctural dyspnea:  No Chest pain/pressure:  no Orthostatic lightheadedness:  minimal Palpitations:  no Lower extremity edema:  1+ Presyncope/syncope: No Cough:  No  Past Medical History:  Diagnosis Date   Anemia    Blood transfusion without reported diagnosis    CAD (coronary artery disease)    Nonobstructive   Closed left ankle fracture    August 30 2012   Clotting disorder (HCC)    DVT (deep venous thrombosis) (HCC)    L leg   Fibroid tumor    HFrEF (heart failure with reduced ejection fraction) (HCC)    Hidradenitis    Hypertension    Mitral regurgitation    Myocardial infarction (HCC)    NICM (nonischemic cardiomyopathy) (HCC)    NSVT (nonsustained ventricular tachycardia) (HCC)    Obesity    OSA on CPAP    Pulmonary embolism (HCC) 04/2013   Sleep apnea     Current Outpatient Medications  Medication Sig Dispense Refill   acetaminophen (TYLENOL) 325 MG tablet Take 2 tablets (650 mg total) by mouth every 4 (four) hours as needed for headache or mild pain.  100 tablet 0   albuterol (VENTOLIN HFA) 108 (90 Base) MCG/ACT inhaler Inhale 2 puffs into the lungs every 2 (two) hours as needed for wheezing or shortness of breath. 18 g 11   atorvastatin (LIPITOR) 20 MG tablet Take 1 tablet (20 mg total) by mouth daily. 90 tablet 3   benzonatate (TESSALON) 100 MG capsule Take 1 capsule (100 mg total) by mouth 3 (three) times daily as needed  for cough. 20 capsule 0   cyclobenzaprine (FLEXERIL) 10 MG tablet Take 1 tablet (10 mg total) by mouth 3 (three) times daily as needed for muscle spasms. 30 tablet 0   empagliflozin (JARDIANCE) 10 MG TABS tablet Take 1 tablet (10 mg total) by mouth daily before breakfast. 90 tablet 3   furosemide (LASIX) 40 MG tablet Take 2 tablets (80 mg total) by mouth 2 (two) times daily. 120 tablet 3   losartan (COZAAR) 25 MG tablet Take 0.5 tablets (12.5 mg total) by mouth daily. 90 tablet 3   metoprolol succinate (TOPROL-XL) 25 MG 24 hr tablet Take 1 tablet (25 mg total) by mouth daily. Take with or immediately following a meal. 90 tablet 3   midodrine (PROAMATINE) 2.5 MG tablet Take 1 tablet (2.5 mg total) by mouth 2 (two) times daily as needed with a meal for Low BP 60 tablet 6   potassium chloride SA (KLOR-CON M) 20 MEQ tablet Take 20 mEq by mouth 2 (two) times daily.     spironolactone (ALDACTONE) 25 MG tablet Take 1 tablet (25 mg total) by mouth daily. 90 tablet 3   warfarin (COUMADIN) 10 MG tablet Take 1/2 tablet to 1 tablet daily or as directed by coumadin clinic. 30 tablet 2   predniSONE (DELTASONE) 20 MG tablet Take 1 tablet (20 mg total) by mouth 2 (two) times daily with a meal for 5 days. (Patient not taking: Reported on 09/16/2022) 10 tablet 0   No current facility-administered medications for this encounter.   Facility-Administered Medications Ordered in Other Encounters  Medication Dose Route Frequency Provider Last Rate Last Admin   0.9 %  sodium chloride infusion   Intravenous Continuous Ellouise Newer, PA-C 300 mL/hr at 03/08/22 0828 New Bag at 03/08/22 0828    Allergies  Allergen Reactions   Sulfa Antibiotics Itching, Swelling and Rash    Lip swelling      Social History   Socioeconomic History   Marital status: Widowed    Spouse name: Not on file   Number of children: Not on file   Years of education: Not on file   Highest education level: Not on file  Occupational History    Not on file  Tobacco Use   Smoking status: Every Day    Types: Cigarettes    Passive exposure: Never   Smokeless tobacco: Never   Tobacco comments:    Smokes 2-3 cigarettes daily as of 02/13/22  Vaping Use   Vaping status: Never Used  Substance and Sexual Activity   Alcohol use: Yes    Alcohol/week: 2.0 standard drinks of alcohol    Types: 2 Cans of beer per week    Comment: twice a week   Drug use: No   Sexual activity: Not Currently    Birth control/protection: Pill, None  Other Topics Concern   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    Has a 46 year old Aviauna.   Live with parents.  Was working 5 days a weeks.   Not working right now.    Attends church.    Social Determinants of Health   Financial Resource Strain: Low Risk  (08/20/2022)   Overall Financial Resource Strain (CARDIA)    Difficulty of Paying Living Expenses: Not very hard  Food Insecurity: No Food Insecurity (07/02/2022)   Hunger Vital Sign    Worried About Running Out of Food in the Last Year: Never true    Ran Out of Food in the Last Year: Never true  Transportation Needs: No Transportation Needs (08/20/2022)   PRAPARE - Administrator, Civil Service (Medical): No    Lack of Transportation (Non-Medical): No  Physical Activity: Not on file  Stress: Not on file  Social Connections: Socially Isolated (08/05/2022)   Social Connection and Isolation Panel [NHANES]    Frequency of Communication with Friends and Family: More than three times a week    Frequency of Social Gatherings with Friends and Family: More than three times a week    Attends Religious Services: Never    Database administrator or Organizations: No    Attends Banker Meetings: Never    Marital Status: Widowed  Intimate Partner Violence: Not At Risk (07/02/2022)   Humiliation, Afraid, Rape, and Kick questionnaire    Fear of Current or Ex-Partner: No     Emotionally Abused: No    Physically Abused: No    Sexually Abused: No      Family History  Problem Relation Age of Onset   Hypertension Mother    Diabetes Paternal Aunt    Diabetes Paternal Uncle    Other Father        blood clots   Cancer Maternal Grandmother    Heart attack Paternal Grandmother    Colon cancer Neg Hx     PHYSICAL EXAM: Vitals:   09/16/22 1108  BP: 102/66  Pulse: 65  SpO2: 99%   GENERAL: Well nourished, well developed, and in no apparent distress at rest.  HEENT: Negative for arcus senilis or xanthelasma. There is no scleral icterus.  The mucous membranes are pink and moist.   NECK: Supple, No masses. Normal carotid upstrokes without bruits. No masses or thyromegaly.    CHEST: There are no chest wall deformities. There is no chest wall tenderness. Respirations are unlabored.  Lungs- CTA B/L CARDIAC:  JVP: 9-10 cm          Normal rate with regular rhythm. No murmurs, rubs or gallops.  Pulses are 2+ and symmetrical in upper and lower extremities. 1+ edema.  ABDOMEN: Soft, non-tender, non-distended. There are no masses or hepatomegaly. There are normal bowel sounds.  EXTREMITIES: Warm and well perfused with no cyanosis, clubbing.  LYMPHATIC: No axillary or supraclavicular lymphadenopathy.  NEUROLOGIC: Patient is oriented x3 with no focal or lateralizing neurologic deficits.  PSYCH: Patients affect is appropriate, there is no evidence of anxiety or depression.  SKIN: Warm and dry; no lesions or wounds.    DATA REVIEW  ECG:NSR  ECHO: 09/24/21: LVEF 30-35%, RV function mildly reduced, severe posterior MR 8/23: LVEF 30-35%, LVID 7.2cm w/ grade III DD, moderate to severe MR. Severely dilated LA.  09/03/17: LVEF 60%-65%.  TEE 2/24: severe eccentric MR, moderately reduced LV function.  07/03/22: LVEF 25-30%  CATH: 06/19/21: 40 to 50% proximal right coronary narrowing. Right dominant anatomy Coronary arteries otherwise normal Significant elevation LVEDP 30  mmHg consistent with acute on chronic combined systolic and diastolic heart  failure Mild pulmonary hypertension, mean pressure 30 mmHg.  WHO group II etiology based on hemodysnamics.. Capillary wedge mean 24 mmHg.  V wave to 40 mmHg suggesting some degree of mitral regurgitation. Cardiac output 8.5 L/min with index 3.57 L/min/m. Pulmonary resistance 0.7 Wood units.  RHC 03/08/22: HEMODYNAMICS: RA:                  15 mmHg (mean) RV:                  72/7-17 mmHg PA:                  74/28 mmHg (43 mean) PCWP:            27 mmHg at the Left PA; unable to wedge right PA despite multiple attempts; V waves on right PA.                                       Estimated Fick CO/CI   5.2 L/min, 2.15 L/min/m2                                            TPG                 16  mmHg                                            PVR                 3.1 Wood Units  PAPi                3       IMPRESSION: Severely elevated pre and post capillary filling pressures.  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    ASSESSMENT & PLAN:  Heart failure with reduced EF Etiology of ZO:[id number], LHC in 2023 with nonobstructive CAD. CMR pending. Possibly valvular exacerbated by stress induced cardiomyopathy NYHA class / AHA Stage:NYHA IIB; continued improvement in functional status; no longer using a wheelchair.  Volume status & Diuretics:  Mildly hypervolemic to euvolemic on lasix 80mg  BID.  Vasodilators:losartan 12.5mg ; started on midodrine 2.5mg  TID. I do not prefer to use midodrine to maintain GDMT doses.   Beta-Blocker:Toprol 25mg  XL JXB:[id number] 25mg  daily.  Cardiometabolic:start jardiance 10mg  daily Devices therapies & Valvulopathies: ICD not indicated yet, will start to uptitrate GDMT. Severe eccentric MR 2/2 poor leaflet coaptation. Will discuss with structural cardiology today.   Advanced therapies: Her further LV dilation, need for midodrine to maintain adequate SBP for GDMT and rising diuretic requirements are concerning to me. She was an optimal candidate for TEER in 2/24 when initially assessed, however, at this time she may require advanced therapies. She continues to smoke which would omit her from transplant evaluation. I discussed this at length with her today. She plans to stop smoking. Will discuss possibility of TEER with structural. Otherwise, schedule RHC/Cpx to further delineate need for advanced therapies.   2. Severe MR - Severe eccentric posteriorly directed MR - TEE confirming severe functional MR. Will discuss possibility for TEER  with structural.  3. CAD - LHC in 5/23 w/ nonobstructive CAD - Followed by general cardiology  4. Hx of PE/DVT w/ IVC filter -Submassive saddle PE extending from the distal main pulmonary artery and to the left and right pulmonary arteries with extension into the left upper lobe, left lower lobe and lingula branches and right upper lobe, right middle lobe and right lower lobes on 04/27/2013  - Evaluated at [facility] where thrombophilia work-up was negative (Lupus inhibitor, anticardiolipin antibodies, and anti beta-2 glycoprotein antibodies were negative; protein C activity 85%, protein S activity 76%, factor V Leiden, and prothrombin gene mutation ) - IVC filter in place; could not be removed due to significant thrombus burden on filter at [facility].  - Previously advised against DOACs due to severity of thrombus burden and weight >250lbs.  - Plan for V/Q scan after RHC.   5. Morbid obesity  - BMI 33.7 today. She has lost significant weight.   Brookelynne Dimperio Advanced Heart Failure Mechanical Circulatory Support

## 2022-09-16 NOTE — Progress Notes (Signed)
ADVANCED HEART FAILURE CLINIC NOTE  Referring Physician: Benetta Spar*  Primary Care: Rica Records, FNP Primary Cardiologist: Dina Rich, MD EP: York Pellant, MD  HPI: [name] [name] is a 47 y.o. female with hx of PE (2015), unprovoked DVT, HFrEF, pancytopenia, nonobstructive CAD, with frequent admissions for shortness of breath 2/2 HFrEF exacerbations, morbid obesity presenting today to establish care.   According to Ms. Upthegrove her functional decline and most of her cardiac history dates back to 2014/2015 when she was diagnosed with unprovoked DVT & PE. Based on chart review, she had a saddle PE extending from the distal main PA to left and right pulmonary arteries w extension to subsegmental arteries. Prior to that she was fairly ambulatory. After her PE she noticed a significant decline in ability to ambulate leading to further weight gain. She was seen by [facility] hematology where hypercoagulable work-up was negative and she was started on life long anticoagulation. In the interim she also had an IVC filter placed that later could not be retrieved due to extensive thrombus burden (2015). Over the past 1 year she has had fairly poor functional status. She has a 6 year daughter that she is trying to take care of but becomes dyspneic after walking > 10-15 feet. In 5/23 she was admitted with acute hypoxic respiratory failure requiring intubation. LHC during that admit w/ nonobstructive CAD. She was admitted again in 9/23 with syncope, nausea and C2 fracture managed conservatively. LVEF during that admit noted to be 35% w/ severe posterior MR. Otherwise she had one additional hospitalization for acute on chronic HFrEF exacerbation. In addition, she is struggling with pancytopenia; anemia believed to be secondary to severe menorrhagia (followed by hematology).   Interval hx:  Since our last visit, Ms. Blitzer has missed x2 structural appointments and has been hospitalized for  decompensated heart failure. TTE during admission in 6/24 with further dilation of her LV and severe MR. From a functional standpoint her symptoms are most consistent with NYHA III. She has days where she is very fatigued with difficulty walking more than 6ft. She can still perform ADLs independently; go to the grocery store and shop without difficulty but overall continues to have a steady decline in function. She continues to smoke but is working on stopping.   Activity level/exercise tolerance:  NYHA III Orthopnea:  2 pillow orthopnea Paroxysmal noctural dyspnea:  No Chest pain/pressure:  no Orthostatic lightheadedness:  minimal Palpitations:  no Lower extremity edema:  1+ Presyncope/syncope: No Cough:  No  Past Medical History:  Diagnosis Date   Anemia    Blood transfusion without reported diagnosis    CAD (coronary artery disease)    Nonobstructive   Closed left ankle fracture    August 30 2012   Clotting disorder (HCC)    DVT (deep venous thrombosis) (HCC)    L leg   Fibroid tumor    HFrEF (heart failure with reduced ejection fraction) (HCC)    Hidradenitis    Hypertension    Mitral regurgitation    Myocardial infarction (HCC)    NICM (nonischemic cardiomyopathy) (HCC)    NSVT (nonsustained ventricular tachycardia) (HCC)    Obesity    OSA on CPAP    Pulmonary embolism (HCC) 04/2013   Sleep apnea     Current Outpatient Medications  Medication Sig Dispense Refill   acetaminophen (TYLENOL) 325 MG tablet Take 2 tablets (650 mg total) by mouth every 4 (four) hours as needed for headache or mild pain.  100 tablet 0   albuterol (VENTOLIN HFA) 108 (90 Base) MCG/ACT inhaler Inhale 2 puffs into the lungs every 2 (two) hours as needed for wheezing or shortness of breath. 18 g 11   atorvastatin (LIPITOR) 20 MG tablet Take 1 tablet (20 mg total) by mouth daily. 90 tablet 3   benzonatate (TESSALON) 100 MG capsule Take 1 capsule (100 mg total) by mouth 3 (three) times daily as needed  for cough. 20 capsule 0   cyclobenzaprine (FLEXERIL) 10 MG tablet Take 1 tablet (10 mg total) by mouth 3 (three) times daily as needed for muscle spasms. 30 tablet 0   empagliflozin (JARDIANCE) 10 MG TABS tablet Take 1 tablet (10 mg total) by mouth daily before breakfast. 90 tablet 3   furosemide (LASIX) 40 MG tablet Take 2 tablets (80 mg total) by mouth 2 (two) times daily. 120 tablet 3   losartan (COZAAR) 25 MG tablet Take 0.5 tablets (12.5 mg total) by mouth daily. 90 tablet 3   metoprolol succinate (TOPROL-XL) 25 MG 24 hr tablet Take 1 tablet (25 mg total) by mouth daily. Take with or immediately following a meal. 90 tablet 3   midodrine (PROAMATINE) 2.5 MG tablet Take 1 tablet (2.5 mg total) by mouth 2 (two) times daily as needed with a meal for Low BP 60 tablet 6   potassium chloride SA (KLOR-CON M) 20 MEQ tablet Take 20 mEq by mouth 2 (two) times daily.     spironolactone (ALDACTONE) 25 MG tablet Take 1 tablet (25 mg total) by mouth daily. 90 tablet 3   warfarin (COUMADIN) 10 MG tablet Take 1/2 tablet to 1 tablet daily or as directed by coumadin clinic. 30 tablet 2   predniSONE (DELTASONE) 20 MG tablet Take 1 tablet (20 mg total) by mouth 2 (two) times daily with a meal for 5 days. (Patient not taking: Reported on 09/16/2022) 10 tablet 0   No current facility-administered medications for this encounter.   Facility-Administered Medications Ordered in Other Encounters  Medication Dose Route Frequency Provider Last Rate Last Admin   0.9 %  sodium chloride infusion   Intravenous Continuous Ellouise Newer, PA-C 300 mL/hr at 03/08/22 0828 New Bag at 03/08/22 0828    Allergies  Allergen Reactions   Sulfa Antibiotics Itching, Swelling and Rash    Lip swelling      Social History   Socioeconomic History   Marital status: Widowed    Spouse name: Not on file   Number of children: Not on file   Years of education: Not on file   Highest education level: Not on file  Occupational History    Not on file  Tobacco Use   Smoking status: Every Day    Types: Cigarettes    Passive exposure: Never   Smokeless tobacco: Never   Tobacco comments:    Smokes 2-3 cigarettes daily as of 02/13/22  Vaping Use   Vaping status: Never Used  Substance and Sexual Activity   Alcohol use: Yes    Alcohol/week: 2.0 standard drinks of alcohol    Types: 2 Cans of beer per week    Comment: twice a week   Drug use: No   Sexual activity: Not Currently    Birth control/protection: Pill, None  Other Topics Concern   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    Has a 46 year old Av[place]una.   Live with parents.  Was working 5 days a weeks.   Not working right now.    Attends church.    Social Determinants of Health   Financial Resource Strain: Low Risk  (08/20/2022)   Overall Financial Resource Strain (CARDIA)    Difficulty of Paying Living Expenses: Not very hard  Food Insecurity: No Food Insecurity (07/02/2022)   Hunger Vital Sign    Worried About Running Out of Food in the Last Year: Never true    Ran Out of Food in the Last Year: Never true  Transportation Needs: No Transportation Needs (08/20/2022)   PRAPARE - Administrator, Civil Service (Medical): No    Lack of Transportation (Non-Medical): No  Physical Activity: Not on file  Stress: Not on file  Social Connections: Socially Isolated (08/05/2022)   Social Connection and Isolation Panel [NHANES]    Frequency of Communication with Friends and Family: More than three times a week    Frequency of Social Gatherings with Friends and Family: More than three times a week    Attends Religious Services: Never    Database administrator or Organizations: No    Attends Banker Meetings: Never    Marital Status: Widowed  Intimate Partner Violence: Not At Risk (07/02/2022)   Humiliation, Afraid, Rape, and Kick questionnaire    Fear of Current or Ex-Partner: No     Emotionally Abused: No    Physically Abused: No    Sexually Abused: No      Family History  Problem Relation Age of Onset   Hypertension Mother    Diabetes Paternal Aunt    Diabetes Paternal Uncle    Other Father        blood clots   Cancer Maternal Grandmother    Heart attack Paternal Grandmother    Colon cancer Neg Hx     PHYSICAL EXAM: Vitals:   09/16/22 1108  BP: 102/66  Pulse: 65  SpO2: 99%   GENERAL: Well nourished, well developed, and in no apparent distress at rest.  HEENT: Negative for arcus senilis or xanthelasma. There is no scleral icterus.  The mucous membranes are pink and moist.   NECK: Supple, No masses. Normal carotid upstrokes without bruits. No masses or thyromegaly.    CHEST: There are no chest wall deformities. There is no chest wall tenderness. Respirations are unlabored.  Lungs- CTA B/L CARDIAC:  JVP: 9-10 cm          Normal rate with regular rhythm. No murmurs, rubs or gallops.  Pulses are 2+ and symmetrical in upper and lower extremities. 1+ edema.  ABDOMEN: Soft, non-tender, non-distended. There are no masses or hepatomegaly. There are normal bowel sounds.  EXTREMITIES: Warm and well perfused with no cyanosis, clubbing.  LYMPHATIC: No axillary or supraclavicular lymphadenopathy.  NEUROLOGIC: Patient is oriented x3 with no focal or lateralizing neurologic deficits.  PSYCH: Patients affect is appropriate, there is no evidence of anxiety or depression.  SKIN: Warm and dry; no lesions or wounds.    DATA REVIEW  ECG:NSR  ECHO: 09/24/21: LVEF 30-35%, RV function mildly reduced, severe posterior MR 8/23: LVEF 30-35%, LVID 7.2cm w/ grade III DD, moderate to severe MR. Severely dilated LA.  09/03/17: LVEF 60%-65%.  TEE 2/24: severe eccentric MR, moderately reduced LV function.  07/03/22: LVEF 25-30%  CATH: 06/19/21: 40 to 50% proximal right coronary narrowing. Right dominant anatomy Coronary arteries otherwise normal Significant elevation LVEDP 30  mmHg consistent with acute on chronic combined systolic and diastolic heart  failure Mild pulmonary hypertension, mean pressure 30 mmHg.  WHO group II etiology based on hemodysnamics.. Capillary wedge mean 24 mmHg.  V wave to 40 mmHg suggesting some degree of mitral regurgitation. Cardiac output 8.5 L/min with index 3.57 L/min/m. Pulmonary resistance 0.7 Wood units.  RHC 03/08/22: HEMODYNAMICS: RA:                  15 mmHg (mean) RV:                  72/7-17 mmHg PA:                  74/28 mmHg (43 mean) PCWP:            27 mmHg at the Left PA; unable to wedge right PA despite multiple attempts; V waves on right PA.                                       Estimated Fick CO/CI   5.2 L/min, 2.15 L/min/m2                                            TPG                 16  mmHg                                            PVR                 3.1 Wood Units  PAPi                3       IMPRESSION: Severely elevated pre and post capillary filling pressures.  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    ASSESSMENT & PLAN:  Heart failure with reduced EF Etiology of ZO:[id number], LHC in 2023 with nonobstructive CAD. CMR pending. Possibly valvular exacerbated by stress induced cardiomyopathy NYHA class / AHA Stage:NYHA IIB; continued improvement in functional status; no longer using a wheelchair.  Volume status & Diuretics:  Mildly hypervolemic to euvolemic on lasix 80mg  BID.  Vasodilators:losartan 12.5mg ; started on midodrine 2.5mg  TID. I do not prefer to use midodrine to maintain GDMT doses.   Beta-Blocker:Toprol 25mg  XL JXB:[id number] 25mg  daily.  Cardiometabolic:start jardiance 10mg  daily Devices therapies & Valvulopathies: ICD not indicated yet, will start to uptitrate GDMT. Severe eccentric MR 2/2 poor leaflet coaptation. Will discuss with structural cardiology today.   Advanced therapies: Her further LV dilation, need for midodrine to maintain adequate SBP for GDMT and rising diuretic requirements are concerning to me. She was an optimal candidate for TEER in 2/24 when initially assessed, however, at this time she may require advanced therapies. She continues to smoke which would omit her from transplant evaluation. I discussed this at length with her today. She plans to stop smoking. Will discuss possibility of TEER with structural. Otherwise, schedule RHC/Cpx to further delineate need for advanced therapies.   2. Severe MR - Severe eccentric posteriorly directed MR - TEE confirming severe functional MR. Will discuss possibility for TEER  with structural.  3. CAD - LHC in 5/23 w/ nonobstructive CAD - Followed by general cardiology  4. Hx of PE/DVT w/ IVC filter -Submassive saddle PE extending from the distal main pulmonary artery and to the left and right pulmonary arteries with extension into the left upper lobe, left lower lobe and lingula branches and right upper lobe, right middle lobe and right lower lobes on 04/27/2013  - Evaluated at [facility] where thrombophilia work-up was negative (Lupus inhibitor, anticardiolipin antibodies, and anti beta-2 glycoprotein antibodies were negative; protein C activity 85%, protein S activity 76%, factor V Leiden, and prothrombin gene mutation ) - IVC filter in place; could not be removed due to significant thrombus burden on filter at [facility].  - Previously advised against DOACs due to severity of thrombus burden and weight >250lbs.  - Plan for V/Q scan after RHC.   5. Morbid obesity  - BMI 33.7 today. She has lost significant weight.   Brookelynne Dimperio Advanced Heart Failure Mechanical Circulatory Support

## 2022-09-16 NOTE — Patient Instructions (Addendum)
Good to see you today!  No medication changes  Your physician has recommended that you have a cardiopulmonary stress test (CPX). CPX testing is a non-invasive measurement of heart and lung function. It replaces a traditional treadmill stress test. This type of test provides a tremendous amount of information that relates not only to your present condition but also for future outcomes. This test combines measurements of you ventilation, respiratory gas exchange in the lungs, electrocardiogram (EKG), blood pressure and physical response before, during, and following an exercise protocol.  Labs done today, your results will be available in MyChart, we will contact you for abnormal readings.  Your physician recommends that you schedule a follow-up appointment in: 1 month  If you have any questions or concerns before your next appointment please send [place] a message through [place] or call our office at [phone].    TO LEAVE A MESSAGE FOR THE NURSE SELECT OPTION 2, PLEASE LEAVE A MESSAGE INCLUDING: YOUR NAME DATE OF BIRTH CALL BACK NUMBER REASON FOR CALL**this is important as we prioritize the call backs  YOU WILL RECEIVE A CALL BACK THE SAME DAY AS LONG AS YOU CALL BEFORE 4:00 PM At the Advanced [facility] Failure Clinic, you and your health needs are our priority. As part of our continuing mission to provide you with exceptional heart care, we have created designated Provider Care Teams. These Care Teams include your primary Cardiologist (physician) and Advanced Practice Providers (APPs- Physician Assistants and Nurse Practitioners) who all work together to provide you with the care you need, when you need it.   You may see any of the following providers on your designated Care Team at your next follow up: Dr Arvilla Meres Dr Marca Ancona Dr. Marcos Eke, NP Robbie Lis, [place] [facility] [place], [place] Brynda Peon, NP Karle Plumber, PharmD   Please be sure to  bring in all your medications bottles to every appointment.    Thank you for choosing [facility]

## 2022-09-17 ENCOUNTER — Telehealth (HOSPITAL_COMMUNITY): Payer: Self-pay

## 2022-09-18 LAB — BMP8+EGFR
BUN/Creatinine Ratio: 21 (ref 9–23)
BUN: 22 mg/dL (ref 6–24)
CO2: 27 mmol/L (ref 20–29)
Calcium: 9.3 mg/dL (ref 8.7–10.2)
Chloride: 98 mmol/L (ref 96–106)
Creatinine, Ser: 1.06 mg/dL — ABNORMAL HIGH (ref 0.57–1.00)
Glucose: 85 mg/dL (ref 70–99)
Potassium: 4.6 mmol/L (ref 3.5–5.2)
Sodium: 138 mmol/L (ref 134–144)
eGFR: 66 mL/min/{1.73_m2} (ref >59)

## 2022-09-18 LAB — CBC
Hematocrit: 40.4 % (ref 34.0–46.6)
Hemoglobin: 12 g/dL (ref 11.1–15.9)
MCH: 23.7 pg — ABNORMAL LOW (ref 26.6–33.0)
MCHC: 29.7 g/dL — ABNORMAL LOW (ref 31.5–35.7)
MCV: 80 fL (ref 79–97)
Platelets: 250 x10E3/uL (ref 150–450)
RBC: 5.06 x10E6/uL (ref 3.77–5.28)
RDW: 20.5 % — ABNORMAL HIGH (ref 11.7–15.4)
WBC: 5.5 x10E3/uL (ref 3.4–10.8)

## 2022-09-18 LAB — TSH+FREE T4
Free T4: 1.5 ng/dL (ref 0.82–1.77)
TSH: 1.86 u[IU]/mL (ref 0.450–4.500)

## 2022-09-18 LAB — MICROALBUMIN / CREATININE URINE RATIO
Creatinine, Urine: 59.2 mg/dL
Microalb/Creat Ratio: 6 mg/g{creat} (ref 0–29)
Microalbumin, Urine: 3.5 ug/mL

## 2022-09-18 LAB — CBC WITH DIFFERENTIAL/PLATELET
Basophils Absolute: 0.1 10*3/uL (ref 0.0–0.2)
Basos: 1 %
EOS (ABSOLUTE): 0.2 10*3/uL (ref 0.0–0.4)
Eos: 4 %
Hematocrit: 40.3 % (ref 34.0–46.6)
Hemoglobin: 11.7 g/dL (ref 11.1–15.9)
Immature Grans (Abs): 0 10*3/uL (ref 0.0–0.1)
Immature Granulocytes: 0 %
Lymphocytes Absolute: 1 10*3/uL (ref 0.7–3.1)
Lymphs: 18 %
MCH: 23.1 pg — ABNORMAL LOW (ref 26.6–33.0)
MCHC: 29 g/dL — ABNORMAL LOW (ref 31.5–35.7)
MCV: 80 fL (ref 79–97)
Monocytes Absolute: 0.4 10*3/uL (ref 0.1–0.9)
Monocytes: 8 %
Neutrophils Absolute: 3.8 10*3/uL (ref 1.4–7.0)
Neutrophils: 69 %
Platelets: 254 10*3/uL (ref 150–450)
RBC: 5.06 x10E6/uL (ref 3.77–5.28)
RDW: 20.7 % — ABNORMAL HIGH (ref 11.7–15.4)
WBC: 5.4 10*3/uL (ref 3.4–10.8)

## 2022-09-18 LAB — LIPID PANEL
Chol/HDL Ratio: 2.4 ratio (ref 0.0–4.4)
Cholesterol, Total: 115 mg/dL (ref 100–199)
HDL: 48 mg/dL (ref >39)
LDL Chol Calc (NIH): 54 mg/dL (ref 0–99)
Triglycerides: 56 mg/dL (ref 0–149)
VLDL Cholesterol Cal: 13 mg/dL (ref 5–40)

## 2022-09-18 LAB — HEPATIC FUNCTION PANEL
ALT: 21 IU/L (ref 0–32)
AST: 29 IU/L (ref 0–40)
Albumin: 4.1 g/dL (ref 3.9–4.9)
Alkaline Phosphatase: 155 IU/L — ABNORMAL HIGH (ref 44–121)
Bilirubin Total: 0.6 mg/dL (ref 0.0–1.2)
Bilirubin, Direct: 0.35 mg/dL (ref 0.00–0.40)
Total Protein: 8 g/dL (ref 6.0–8.5)

## 2022-09-18 LAB — IRON,TIBC AND FERRITIN PANEL
Ferritin: 40 ng/mL (ref 15–150)
Iron Saturation: 6 % — CL (ref 15–55)
Iron: 26 ug/dL — ABNORMAL LOW (ref 27–159)
Total Iron Binding Capacity: 460 ug/dL — ABNORMAL HIGH (ref 250–450)
UIBC: 434 ug/dL — ABNORMAL HIGH (ref 131–425)

## 2022-09-18 LAB — MAGNESIUM: Magnesium: 2.2 mg/dL (ref 1.6–2.3)

## 2022-09-18 LAB — HEMOGLOBIN A1C
Est. average glucose Bld gHb Est-mCnc: 111 mg/dL
Hgb A1c MFr Bld: 5.5 % (ref 4.8–5.6)

## 2022-09-18 LAB — B12 AND FOLATE PANEL
Folate: 10.7 ng/mL (ref >3.0)
Vitamin B-12: 455 pg/mL (ref 232–1245)

## 2022-09-18 LAB — VITAMIN D 25 HYDROXY (VIT D DEFICIENCY, FRACTURES): Vit D, 25-Hydroxy: 11 ng/mL — ABNORMAL LOW (ref 30.0–100.0)

## 2022-09-18 LAB — BRAIN NATRIURETIC PEPTIDE: BNP: 862.5 pg/mL — ABNORMAL HIGH (ref 0.0–100.0)

## 2022-09-19 ENCOUNTER — Ambulatory Visit: Payer: Medicare HMO | Admitting: Adult Health

## 2022-09-20 ENCOUNTER — Other Ambulatory Visit: Payer: Self-pay | Admitting: Family Medicine

## 2022-09-20 MED ORDER — IRON (FERROUS SULFATE) 325 (65 FE) MG PO TABS: 325.0000 mg | ORAL_TABLET | Freq: Every day | ORAL | 3 refills | Status: DC

## 2022-09-20 MED ORDER — VITAMIN D3 50 MCG (2000 UT) PO CAPS: 2000.0000 [IU] | ORAL_CAPSULE | Freq: Every day | ORAL | 1 refills | Status: DC

## 2022-09-20 NOTE — Progress Notes (Signed)
  Care Coordination Note  09/20/2022 Name: [name] MRN: [id number] DOB: 21-Dec-1975  [name] [name] is a 47 y.o. year old female who is a primary care patient of Del Nigel Berthold, FNP and is actively engaged with the care management team. I reached out to [name] [name] by phone today to assist with re-scheduling an initial visit with the RN Case Manager  Follow up plan: Unsuccessful telephone outreach attempt made. A HIPAA compliant phone message was left for the patient providing contact information and requesting a return call.  We have been unable to make contact with the patient for follow up. The care management team is available to follow up with the patient after provider conversation with the patient regarding recommendation for care management engagement and subsequent re-referral to the care management team.   [facility] Coordination Care Guide  Direct Dial: [phone]

## 2022-09-24 ENCOUNTER — Telehealth (HOSPITAL_COMMUNITY): Payer: Self-pay | Admitting: *Deleted

## 2022-09-24 NOTE — Telephone Encounter (Signed)
Called patient for reminder of CPX appointment on Monday 09/30/22 at 1:30pm . Patient confirmed appointment and all applicable questions were answered. Provided patient with call back [phone] if cancellation/reschedule is necessary. Any clinical concerns beyond CPX procedure were advised to be discussed with ordering provider.    Reggy Eye, MS, ACSM, [facility] Clinical Exercise Physiologist/ Health and Wellness Coach

## 2022-09-25 NOTE — Telephone Encounter (Signed)
No answer, Left message to return call.   [facility] [address] [place] [address] Dept: [phone] Loc: [phone]  [name] [name]  09/25/2022  You are scheduled for a Cardiac Catheterization on Friday, September 13 with Dr.  Dorthula Nettles .  1. Please arrive at the [facility] (Main Entrance A) at [facility]: [address] [place], [place] [address] at 10:00 AM (This time is 2 hour(s) before your procedure to ensure your preparation). Free valet parking service is available. You will check in at ADMITTING. The support person will be asked to wait in the waiting room.  It is OK to have someone drop you off and come back when you are ready to be discharged.    Special note: Every effort is made to have your procedure done on time. Please understand that emergencies sometimes delay scheduled procedures.  2. Diet: Do not eat solid foods after midnight.  The patient may have clear liquids until 5am upon the day of the procedure.  4. Medication instructions in preparation for your procedure:  DO NOT take Lasix the day of your procedure.  Do not take Diabetes Med Jardiance on the day of the procedure and HOLD 48 HOURS AFTER THE PROCEDURE.  On the morning of your procedure, take your  Warfarin  and any morning medicines NOT listed above.  You may use sips of water.  5. Plan to go home the same day, you will only stay overnight if medically necessary. 6. Bring a current list of your medications and current insurance cards. 7. You MUST have a responsible person to drive you home. 8. Someone MUST be with you the first 24 hours after you arrive home or your discharge will be delayed. 9. Please wear clothes that are easy to get on and off and wear slip-on shoes.  Thank you for allowing [place] to care for you!   -- [facility]

## 2022-09-27 ENCOUNTER — Telehealth: Payer: Self-pay | Admitting: Cardiology

## 2022-09-27 NOTE — Telephone Encounter (Signed)
Returned pt's call and made her aware that a change Warfarin manufacturing can affect INR at times. Pt is OVERDUE to have INR checked and has not been seen in anticoagulation clinic since 08/14/22. I scheduled pt a coumadin clinic appt in [place] on Monday, 09/30/22 at 8am. Pt verbalized understanding and appreciated the call.

## 2022-09-27 NOTE — Telephone Encounter (Signed)
Pt c/o medication issue:  1. Name of Medication: warfarin (COUMADIN) 10 MG tablet   2. How are you currently taking this medication (dosage and times per day)?   Take 1/2 tablet to 1 tablet daily or as directed by coumadin clinic.    3. Are you having a reaction (difficulty breathing--STAT)? No  4. What is your medication issue? Pt states the warfarin changed manufacturers and wanted to call and let us know so we would not be surprised by this. Please advise

## 2022-09-30 ENCOUNTER — Other Ambulatory Visit (HOSPITAL_COMMUNITY): Payer: Self-pay | Admitting: *Deleted

## 2022-09-30 ENCOUNTER — Ambulatory Visit (HOSPITAL_COMMUNITY): Payer: Medicare HMO | Attending: Internal Medicine

## 2022-09-30 ENCOUNTER — Ambulatory Visit: Payer: Medicare HMO | Attending: Cardiology | Admitting: *Deleted

## 2022-09-30 ENCOUNTER — Other Ambulatory Visit (HOSPITAL_COMMUNITY)
Admission: RE | Admit: 2022-09-30 | Discharge: 2022-09-30 | Disposition: A | Discharge status: Home / Self Care | Payer: Medicare HMO | Source: Ambulatory Visit | Attending: Cardiology | Admitting: Cardiology

## 2022-09-30 DIAGNOSIS — Z5181 Encounter for therapeutic drug level monitoring: Secondary | ICD-10-CM

## 2022-09-30 DIAGNOSIS — I5042 Chronic combined systolic (congestive) and diastolic (congestive) heart failure: Secondary | ICD-10-CM | POA: Diagnosis not present

## 2022-09-30 DIAGNOSIS — Z86718 Personal history of other venous thrombosis and embolism: Secondary | ICD-10-CM | POA: Insufficient documentation

## 2022-09-30 DIAGNOSIS — Z0100 Encounter for examination of eyes and vision without abnormal findings: Secondary | ICD-10-CM | POA: Diagnosis not present

## 2022-09-30 DIAGNOSIS — E669 Obesity, unspecified: Secondary | ICD-10-CM | POA: Insufficient documentation

## 2022-09-30 DIAGNOSIS — I5043 Acute on chronic combined systolic (congestive) and diastolic (congestive) heart failure: Secondary | ICD-10-CM | POA: Diagnosis not present

## 2022-09-30 DIAGNOSIS — Z6841 Body Mass Index (BMI) 40.0 and over, adult: Secondary | ICD-10-CM | POA: Diagnosis not present

## 2022-09-30 DIAGNOSIS — H35033 Hypertensive retinopathy, bilateral: Secondary | ICD-10-CM | POA: Diagnosis not present

## 2022-09-30 DIAGNOSIS — Z86711 Personal history of pulmonary embolism: Secondary | ICD-10-CM | POA: Insufficient documentation

## 2022-09-30 DIAGNOSIS — I10 Essential (primary) hypertension: Secondary | ICD-10-CM | POA: Diagnosis not present

## 2022-09-30 DIAGNOSIS — R918 Other nonspecific abnormal finding of lung field: Secondary | ICD-10-CM | POA: Insufficient documentation

## 2022-09-30 LAB — PROTIME-INR
INR: 4.7 (ref 0.8–1.2)
Prothrombin Time: 44.3 s — ABNORMAL HIGH (ref 11.4–15.2)

## 2022-09-30 LAB — POCT INR: INR: 7.2 — AB (ref 2.0–3.0)

## 2022-09-30 NOTE — Patient Instructions (Signed)
Pending heart cath 9/13 Hold warfarin tonight and tomorrow night take 10mg  Wednesday and recheck INR on Thursday Recheck in 3 days

## 2022-10-03 ENCOUNTER — Ambulatory Visit: Payer: Medicare HMO | Attending: Cardiology | Admitting: *Deleted

## 2022-10-03 ENCOUNTER — Telehealth: Payer: Self-pay | Admitting: *Deleted

## 2022-10-03 ENCOUNTER — Telehealth (HOSPITAL_COMMUNITY): Payer: Self-pay

## 2022-10-03 DIAGNOSIS — Z86711 Personal history of pulmonary embolism: Secondary | ICD-10-CM | POA: Diagnosis not present

## 2022-10-03 DIAGNOSIS — Z86718 Personal history of other venous thrombosis and embolism: Secondary | ICD-10-CM

## 2022-10-03 DIAGNOSIS — Z5181 Encounter for therapeutic drug level monitoring: Secondary | ICD-10-CM | POA: Diagnosis not present

## 2022-10-03 LAB — POCT INR: INR: 1.8 — AB (ref 2.0–3.0)

## 2022-10-03 NOTE — Patient Instructions (Signed)
Pending heart cath 9/13 Restart warfarin 1 tablet daily except 1/2 tablet on Sundays and Thursdays Recheck in 1 wk

## 2022-10-03 NOTE — Telephone Encounter (Signed)
Spoke to patient about procedure scheduled for tomorrow. Aware of time and place.Nothing to eat or drink after midnight. Aware of holding lasix and Jardiance am of procedure.Has transportation to and from procedure.

## 2022-10-04 ENCOUNTER — Other Ambulatory Visit (HOSPITAL_COMMUNITY): Payer: Self-pay | Admitting: Unknown Physician Specialty

## 2022-10-04 ENCOUNTER — Encounter (HOSPITAL_COMMUNITY): Payer: Self-pay | Admitting: Unknown Physician Specialty

## 2022-10-04 ENCOUNTER — Telehealth (HOSPITAL_COMMUNITY): Payer: Self-pay | Admitting: *Deleted

## 2022-10-04 ENCOUNTER — Telehealth: Payer: Self-pay | Admitting: *Deleted

## 2022-10-04 ENCOUNTER — Other Ambulatory Visit: Payer: Self-pay

## 2022-10-04 ENCOUNTER — Encounter (HOSPITAL_COMMUNITY)
Admission: RE | Disposition: A | Discharge status: Home / Self Care | Payer: Self-pay | Source: Ambulatory Visit | Attending: Cardiology

## 2022-10-04 ENCOUNTER — Ambulatory Visit (HOSPITAL_COMMUNITY)
Admission: RE | Admit: 2022-10-04 | Discharge: 2022-10-04 | Disposition: A | Discharge status: Home / Self Care | Payer: Medicare HMO | Source: Ambulatory Visit | Attending: Cardiology | Admitting: Cardiology

## 2022-10-04 DIAGNOSIS — I272 Pulmonary hypertension, unspecified: Secondary | ICD-10-CM | POA: Insufficient documentation

## 2022-10-04 DIAGNOSIS — I5020 Unspecified systolic (congestive) heart failure: Secondary | ICD-10-CM

## 2022-10-04 DIAGNOSIS — R0602 Shortness of breath: Secondary | ICD-10-CM

## 2022-10-04 DIAGNOSIS — Z86718 Personal history of other venous thrombosis and embolism: Secondary | ICD-10-CM | POA: Insufficient documentation

## 2022-10-04 DIAGNOSIS — I270 Primary pulmonary hypertension: Secondary | ICD-10-CM

## 2022-10-04 DIAGNOSIS — Z79899 Other long term (current) drug therapy: Secondary | ICD-10-CM | POA: Diagnosis not present

## 2022-10-04 DIAGNOSIS — F17210 Nicotine dependence, cigarettes, uncomplicated: Secondary | ICD-10-CM | POA: Insufficient documentation

## 2022-10-04 DIAGNOSIS — I2510 Atherosclerotic heart disease of native coronary artery without angina pectoris: Secondary | ICD-10-CM | POA: Diagnosis not present

## 2022-10-04 DIAGNOSIS — I110 Hypertensive heart disease with heart failure: Secondary | ICD-10-CM | POA: Diagnosis not present

## 2022-10-04 DIAGNOSIS — I5022 Chronic systolic (congestive) heart failure: Secondary | ICD-10-CM | POA: Diagnosis not present

## 2022-10-04 DIAGNOSIS — Z7901 Long term (current) use of anticoagulants: Secondary | ICD-10-CM

## 2022-10-04 DIAGNOSIS — Z6833 Body mass index (BMI) 33.0-33.9, adult: Secondary | ICD-10-CM | POA: Diagnosis not present

## 2022-10-04 DIAGNOSIS — Z86711 Personal history of pulmonary embolism: Secondary | ICD-10-CM | POA: Diagnosis not present

## 2022-10-04 HISTORY — PX: RIGHT HEART CATH: CATH118263

## 2022-10-04 LAB — PROTIME-INR
INR: 1.7 — ABNORMAL HIGH (ref 0.8–1.2)
Prothrombin Time: 19.7 s — ABNORMAL HIGH (ref 11.4–15.2)

## 2022-10-04 LAB — POCT I-STAT EG7
Acid-Base Excess: 3 mmol/L — ABNORMAL HIGH (ref 0.0–2.0)
Acid-Base Excess: 2 mmol/L (ref 0.0–2.0)
Acid-Base Excess: 3 mmol/L — ABNORMAL HIGH (ref 0.0–2.0)
Bicarbonate: 28.8 mmol/L — ABNORMAL HIGH (ref 20.0–28.0)
Bicarbonate: 27.8 mmol/L (ref 20.0–28.0)
Bicarbonate: 28.6 mmol/L — ABNORMAL HIGH (ref 20.0–28.0)
Calcium, Ion: 1.21 mmol/L (ref 1.15–1.40)
Calcium, Ion: 1.2 mmol/L (ref 1.15–1.40)
Calcium, Ion: 1.22 mmol/L (ref 1.15–1.40)
HCT: 42 % (ref 36.0–46.0)
HCT: 40 % (ref 36.0–46.0)
HCT: 40 % (ref 36.0–46.0)
Hemoglobin: 14.3 g/dL (ref 12.0–15.0)
Hemoglobin: 13.6 g/dL (ref 12.0–15.0)
Hemoglobin: 13.6 g/dL (ref 12.0–15.0)
O2 Saturation: 62 %
O2 Saturation: 56 %
O2 Saturation: 59 %
Patient temperature: 37
Patient temperature: 37
Potassium: 4.1 mmol/L (ref 3.5–5.1)
Potassium: 4 mmol/L (ref 3.5–5.1)
Potassium: 4.1 mmol/L (ref 3.5–5.1)
Sodium: 140 mmol/L (ref 135–145)
Sodium: 139 mmol/L (ref 135–145)
Sodium: 139 mmol/L (ref 135–145)
TCO2: 30 mmol/L (ref 22–32)
TCO2: 29 mmol/L (ref 22–32)
TCO2: 30 mmol/L (ref 22–32)
pCO2, Ven: 48.1 mmHg (ref 44–60)
pCO2, Ven: 45.7 mmHg (ref 44–60)
pCO2, Ven: 47.7 mmHg (ref 44–60)
pH, Ven: 7.385 (ref 7.25–7.43)
pH, Ven: 7.386 (ref 7.25–7.43)
pH, Ven: 7.392 (ref 7.25–7.43)
pO2, Ven: 33 mmHg (ref 32–45)
pO2, Ven: 30 mmHg — CL (ref 32–45)
pO2, Ven: 31 mmHg — CL (ref 32–45)

## 2022-10-04 LAB — PREGNANCY, URINE: Preg Test, Ur: NEGATIVE

## 2022-10-04 SURGERY — RIGHT HEART CATH
Anesthesia: LOCAL

## 2022-10-04 MED ORDER — LIDOCAINE HCL (PF) 1 % IJ SOLN
INTRAMUSCULAR | Status: DC | PRN
  Administered 2022-10-04: 2 mL via INTRADERMAL

## 2022-10-04 MED ORDER — LOSARTAN POTASSIUM 25 MG PO TABS: 25.0000 mg | ORAL_TABLET | Freq: Every day | ORAL | Status: DC

## 2022-10-04 MED ORDER — HEPARIN (PORCINE) IN NACL 1000-0.9 UT/500ML-% IV SOLN
INTRAVENOUS | Status: DC | PRN
  Administered 2022-10-04: 500 mL

## 2022-10-04 MED ORDER — SODIUM CHLORIDE 0.9 % IV SOLN: INTRAVENOUS | Status: DC

## 2022-10-04 MED ORDER — ASPIRIN 81 MG PO CHEW
81.0000 mg | CHEWABLE_TABLET | ORAL | Status: AC
  Administered 2022-10-04: 81 mg via ORAL
  Filled 2022-10-04: qty 1

## 2022-10-04 SURGICAL SUPPLY — 6 items
CATH SWAN GANZ 7F STRAIGHT (CATHETERS) IMPLANT
GLIDESHEATH SLENDER 7FR .021G (SHEATH) IMPLANT
GUIDEWIRE .025 260CM (WIRE) IMPLANT
PACK CARDIAC CATHETERIZATION (CUSTOM PROCEDURE TRAY) ×1 IMPLANT
TRANSDUCER W/STOPCOCK (MISCELLANEOUS) IMPLANT
TUBING ART PRESS 72 MALE/FEM (TUBING) IMPLANT

## 2022-10-04 NOTE — Discharge Instructions (Signed)

## 2022-10-04 NOTE — Telephone Encounter (Signed)
Telephone encounter was:  Successful.  10/04/2022 Name: [name] [name] MRN: [id number] DOB: 09/08/1975  [name] [name] is a 47 y.o. year old female who is a primary care patient of Del Nigel Berthold, FNP . The community resource team was consulted for assistance with transportation Patient was able to get a pickup , relayed via [facility] now line patient outreached explained and signed waiver , one time pick up due to short notice appt patient has medicaid transportation  Care guide performed the following interventions: Patient provided with information about care guide support team and interviewed to confirm resource needs.  Follow Up Plan:  No further follow up planned at this time. The patient has been provided with needed resources.  Dione Booze [facility]  Population Health Careguide  Direct Dial: [phone] Website: [email or web address]       [name] [name] DOB: 1975/10/23 MRN: [id number]   RIDER WAIVER AND RELEASE OF LIABILITY  For purposes of improving physical access to our facilities, [facility] is pleased to partner with third parties to provide [place] patients or other authorized individuals the option of convenient, on-demand ground transportation services (the [employer]") through use of the technology service that enables users to request on-demand ground transportation from independent third-party providers.  By opting to use and accept these [employer], I, the undersigned, hereby agree on behalf of myself, and on behalf of any minor child using the Science writer for whom I am the parent or legal guardian, as follows:  Science writer provided to me are provided by independent third-party transportation providers who are not [employer] or employees and who are unaffiliated with [employer]. [place] is neither a transportation carrier nor a common or public carrier. [place] has no control over the quality or  safety of the transportation that occurs as a result of the [employer]. [place] cannot guarantee that any third-party transportation provider will complete any arranged transportation service. [place] makes no representation, warranty, or guarantee regarding the reliability, timeliness, quality, safety, suitability, or availability of any of the Transport Services or that they will be error free. I fully understand that traveling by vehicle involves risks and dangers of serious bodily injury, including permanent disability, paralysis, and death. I agree, on behalf of myself and on behalf of any minor child using the Transport Services for whom I am the parent or legal guardian, that the entire risk arising out of my use of the [employer] remains solely with me, to the maximum extent permitted under applicable law. The [employer] are provided "as is" and "as available." [place] disclaims all representations and warranties, express, implied or statutory, not expressly set out in these terms, including the implied warranties of merchantability and fitness for a particular purpose. I hereby waive and release [place], its agents, employees, officers, directors, representatives, insurers, attorneys, assigns, successors, subsidiaries, and affiliates from any and all past, present, or future claims, demands, liabilities, actions, causes of action, or suits of any kind directly or indirectly arising from acceptance and use of the [employer]. I further waive and release [place] and its affiliates from all present and future liability and responsibility for any injury or death to persons or damages to property caused by or related to the use of the [employer]. I have read this Waiver and Release of Liability, and I understand the terms used in it and their legal significance. This Waiver is freely and  voluntarily given with the understanding that my right (as  well as the right of any minor child for whom I am the parent or legal guardian using the [employer]) to legal recourse against [place] in connection with the [employer] is knowingly surrendered in return for use of these services.   I attest that I read the consent document to [name] [name], gave Ms. Baez the opportunity to ask questions and answered the questions asked (if any). I affirm that [name] [name] then provided consent for she's participation in this program.     Dione Booze

## 2022-10-04 NOTE — Addendum Note (Signed)
Addended by: Carlton Adam B on: 10/04/2022 02:32 PM   Modules accepted: Orders

## 2022-10-04 NOTE — Progress Notes (Signed)
MCS EDUCATION NOTE:                VAD evaluation consent reviewed and signed by Ms Recla and designated caregiver Steward Drone - her mom.  Initial VAD teaching completed with pt and caregiver.   VAD educational packet including "Understanding Your Options with Advanced Heart Failure", "[place] Patient Agreement for VAD Evaluation and Potential Implantation" consent, and [facility] "Heartmate 3 Left Ventricular Device (LVAD) Patient Guide", Heartmate 3 Left Ventricular Assist System Patient Education Program DVD", "[place] HM III Patient Education", "[place] Mechanical Circulatory Support Program", and "Decision Aids for Left Ventricular Assist Device" reviewed in detail and left at bedside for continued reference.   All questions answered regarding VAD implant, hospital stay, and what to expect when discharged home living with a heart pump. Pt identified Steward Drone her mom as her primary caregiver.  Explained need for 24/7 care when pt is discharged home due to sternal precautions, adaptation to living on support, emotional support, consistent and meticulous exit site care and management, medication adherence and high volume of follow up visits with the VAD Clinic after discharge; both pt and caregiver verbalized understanding of above.   Explained that LVAD can be implanted for two indications in the setting of advanced left ventricular heart failure treatment:  Bridge to transplant - used for patients who cannot safely wait for heart transplant without this device.  Or    Destination therapy - used for patients until end of life or recovery of heart function.  Patient and caregiver(s) acknowledge that the indication at this point in time for LVAD therapy would be for DT due to current smoking.   Provided brief equipment overview on the following:   a) mobile power unit b) system controller   c) universal Magazine features editor   d) battery clips   e) Batteries   f)  Perc lock   g) Percutaneous lead    Discussed:  a) changing power source on system controller from tethered (MPU) to untethered (battery) mode   b) changing power source on system controller from untethered (battery) to tethered (MPU) mode   c) how to monitor battery life both on the system controller and on each individual battery   d) changing batteries   Reviewed and supplied a copy of home inspection check list stressing that only three pronged grounded power outlets can be used for VAD equipment. Ms Condren confirmed home has electrical outlets that will support the equipment along with access working telephone.  Identified the following lifestyle modifications while living on MCS:    1. No driving for at least three months and then only if doctor gives permission to do so.   2. No tub baths while pump implanted, and shower only when doctor gives permission.   3. No swimming or submersion in water while implanted with pump.   4. No contact sports or engaging in jumping activities.   5. Always have a backup controller, charged spare batteries, and battery clips nearby at all times in case of emergency.   6. Call the doctor or hospital contact person if any change in how the pump sounds, feels, or works.   7. Plan to sleep only when connected to the power module.   8. Do not sleep on your stomach.   9. Keep a backup system controller, charged batteries, battery clips, and flashlight near you during sleep in case of electrical power outage.   10. Exit site care including dressing changes, monitoring for  infection, and importance of keeping percutaneous lead stabilized at all times.     Extended the option to have one of our current patients and caregiver(s) come to talk with them about living on support} to assist with decision making. We will work to arrange at patient to meet with her while she is at her clinic visit with DR Gasper Lloyd.   Reviewed pictures of VAD drive line, site care, dressing changes, and drive line  stabilization including securement attachment device and abdominal binder. Discussed with pt and family that they will be required to purchase dressing supplies as long as patient has the VAD in place.   She will also need to abide by sternal precautions with no lifting >10lbs, pushing, pulling and will need assistance with adapting to new life style with VAD equipment and care.   [facility] patient survival statistics through March 2024 reviewed with patient and caregiver as follows:    The patient understands that from this discussion it does not mean that they will receive the device, but that depends on an extensive evaluation process. The patient is aware of the fact that if at anytime they want to stop the evaluation process they can.  All questions have been answered at this time and contact information was provided should they encounter any further questions.  They are both agreeable at this time to the evaluation process and will move forward.   Carlton Adam, RN VAD Coordinator   Office: [phone] 24/7 VAD Pager: [phone]

## 2022-10-08 ENCOUNTER — Encounter (HOSPITAL_COMMUNITY): Payer: Self-pay | Admitting: Cardiology

## 2022-10-09 ENCOUNTER — Ambulatory Visit: Payer: Medicare HMO | Attending: Cardiology | Admitting: *Deleted

## 2022-10-09 DIAGNOSIS — Z86711 Personal history of pulmonary embolism: Secondary | ICD-10-CM

## 2022-10-09 DIAGNOSIS — Z5181 Encounter for therapeutic drug level monitoring: Secondary | ICD-10-CM

## 2022-10-09 DIAGNOSIS — Z86718 Personal history of other venous thrombosis and embolism: Secondary | ICD-10-CM

## 2022-10-09 LAB — POCT INR: POC INR: 2.7

## 2022-10-09 NOTE — Patient Instructions (Signed)
Description   Continue warfarin 1 tablet daily except 1/2 tablet on Sundays and Thursdays Recheck in 2 wks

## 2022-10-10 ENCOUNTER — Ambulatory Visit (HOSPITAL_COMMUNITY)
Admission: RE | Admit: 2022-10-10 | Discharge: 2022-10-10 | Disposition: A | Discharge status: Home / Self Care | Payer: Medicare HMO | Source: Ambulatory Visit | Attending: Nurse Practitioner | Admitting: Nurse Practitioner

## 2022-10-10 DIAGNOSIS — R590 Localized enlarged lymph nodes: Secondary | ICD-10-CM | POA: Diagnosis not present

## 2022-10-10 DIAGNOSIS — I700 Atherosclerosis of aorta: Secondary | ICD-10-CM | POA: Diagnosis not present

## 2022-10-10 DIAGNOSIS — J351 Hypertrophy of tonsils: Secondary | ICD-10-CM | POA: Diagnosis not present

## 2022-10-10 DIAGNOSIS — S12101D Unspecified nondisplaced fracture of second cervical vertebra, subsequent encounter for fracture with routine healing: Secondary | ICD-10-CM | POA: Diagnosis not present

## 2022-10-10 DIAGNOSIS — R222 Localized swelling, mass and lump, trunk: Secondary | ICD-10-CM | POA: Insufficient documentation

## 2022-10-10 MED ORDER — IOHEXOL 300 MG/ML  SOLN
100.0000 mL | Freq: Once | INTRAMUSCULAR | Status: AC | PRN
  Administered 2022-10-10: 100 mL via INTRAVENOUS

## 2022-10-16 ENCOUNTER — Encounter (HOSPITAL_COMMUNITY): Payer: Medicare HMO | Admitting: Cardiology

## 2022-10-18 ENCOUNTER — Encounter (HOSPITAL_COMMUNITY): Payer: Self-pay | Admitting: *Deleted

## 2022-10-18 ENCOUNTER — Ambulatory Visit (HOSPITAL_COMMUNITY): Payer: Medicare HMO

## 2022-10-18 ENCOUNTER — Encounter (HOSPITAL_COMMUNITY): Payer: Self-pay

## 2022-10-18 ENCOUNTER — Encounter (HOSPITAL_COMMUNITY): Payer: Self-pay | Admitting: Hematology

## 2022-10-18 ENCOUNTER — Encounter (HOSPITAL_COMMUNITY): Payer: Medicare HMO

## 2022-10-18 ENCOUNTER — Encounter (HOSPITAL_COMMUNITY): Payer: Medicare HMO | Admitting: Cardiology

## 2022-10-18 ENCOUNTER — Encounter: Payer: Medicare HMO | Admitting: Cardiothoracic Surgery

## 2022-10-18 ENCOUNTER — Telehealth (HOSPITAL_COMMUNITY): Payer: Self-pay | Admitting: *Deleted
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
## 2022-10-21 ENCOUNTER — Telehealth (HOSPITAL_COMMUNITY): Payer: Self-pay | Admitting: *Deleted

## 2022-10-21 NOTE — Telephone Encounter (Signed)
Attempted to call both pt's cell & home numbers listed in chart to discuss rescheduled VAD appts on Thursday 10/3. Both numbers state "I'm sorry your call can not be completed at this time." Letters sent via mail and MyChart on Friday with appt information.   Alyce Pagan RN VAD Coordinator  Office: [phone]  24/7 Pager: [phone]

## 2022-10-21 NOTE — Interval H&P Note (Signed)
History and Physical Interval Note:  10/21/2022 12:31 PM  [name] [name]  has presented today for surgery, with the diagnosis of heart failure.  The various methods of treatment have been discussed with the patient and family. After consideration of risks, benefits and other options for treatment, the patient has consented to  Procedure(s): RIGHT HEART CATH (N/A) as a surgical intervention.  The patient's history has been reviewed, patient examined, no change in status, stable for surgery.  I have reviewed the patient's chart and labs.  Questions were answered to the patient's satisfaction.     [name] [name]

## 2022-10-22 ENCOUNTER — Encounter (HOSPITAL_COMMUNITY): Payer: Self-pay | Admitting: Hematology

## 2022-10-23 ENCOUNTER — Other Ambulatory Visit (HOSPITAL_COMMUNITY): Payer: Self-pay | Admitting: *Deleted

## 2022-10-23 DIAGNOSIS — I5042 Chronic combined systolic (congestive) and diastolic (congestive) heart failure: Secondary | ICD-10-CM

## 2022-10-23 DIAGNOSIS — Z01818 Encounter for other preprocedural examination: Secondary | ICD-10-CM

## 2022-10-23 DIAGNOSIS — I5020 Unspecified systolic (congestive) heart failure: Secondary | ICD-10-CM

## 2022-10-24 ENCOUNTER — Encounter (HOSPITAL_COMMUNITY): Payer: Medicare HMO | Admitting: Cardiology

## 2022-10-24 ENCOUNTER — Ambulatory Visit (HOSPITAL_COMMUNITY): Admission: RE | Admit: 2022-10-24 | Payer: Medicare HMO | Source: Ambulatory Visit

## 2022-10-24 ENCOUNTER — Ambulatory Visit (HOSPITAL_COMMUNITY): Payer: Medicare HMO | Attending: Cardiology

## 2022-10-24 ENCOUNTER — Encounter (HOSPITAL_COMMUNITY): Payer: Self-pay | Admitting: Unknown Physician Specialty

## 2022-10-24 ENCOUNTER — Encounter: Payer: Medicare HMO | Admitting: Cardiothoracic Surgery

## 2022-10-24 ENCOUNTER — Inpatient Hospital Stay (HOSPITAL_COMMUNITY): Admission: RE | Admit: 2022-10-24 | Payer: Medicare HMO | Source: Ambulatory Visit

## 2022-10-25 ENCOUNTER — Ambulatory Visit: Payer: Medicare HMO | Admitting: Family Medicine

## 2022-10-28 ENCOUNTER — Encounter: Payer: Self-pay | Admitting: Family Medicine

## 2022-10-29 ENCOUNTER — Encounter: Payer: Self-pay | Admitting: Nurse Practitioner

## 2022-10-29 ENCOUNTER — Ambulatory Visit: Payer: Medicare HMO | Attending: Nurse Practitioner | Admitting: Nurse Practitioner

## 2022-10-29 NOTE — Progress Notes (Deleted)
Cardiology Office Note:  .   Date:  09/06/2022 ID:  [name], DOB [birth year], MRN [id number] PCP: Rica Records, FNP  [facility] Providers Cardiologist:  Dina Rich, MD Electrophysiologist:  Maurice Small, MD    History of Present Illness: .   ELIETTE DRUMWRIGHT is a 47 y.o. female with a PMH of nonobstructive CAD, chronic combined CHF (NICM), hx of unprovoked saddle PE in 2015, s/p IVC filter, recurrent unprovoked DVT, s/p thrombectomy, HTN, NSVT, OSA on CPAP, pulmonary HTN, hx of MR and TR, hx of syncope, obesity, and chronic anemia, who presents today for scheduled folllow-up.   Last saw patient on May 21, 2022. She noticed weight was up and increased edema, lasix dosage was increased.   Hospital admission 06/2022 for acute on chronic combined CHF exacerbation. Repeat Echo EF 25-30% from 30-35%, grade 3 DD, moderate to severe MR.   I last saw patient for follow-up on July 23, 2022. Was doing much better since leaving the hospital. Weight at baseline. Compliant with her medications. Overall denied any shortness of breath, palpitations, syncope, presyncope, dizziness, orthopnea, PND, swelling or significant weight changes, acute bleeding, or claudication. Did note some soreness along her chest at times, but denied any anginal chest pain symptoms.   Today she presents for follow-up. Doing well. Says she will need a repeat referral sent to structural heart team regarding her mitral/tricuspid valve. Says area along right upper chest is getting more noticeable and bigger in size as she is losing weight. Has lost almost 10 lbs since I last saw her from watching what she is eating. Denies any chest pain, shortness of breath, palpitations, syncope, presyncope, dizziness, orthopnea, PND, swelling or significant weight changes, acute bleeding, or claudication.   Studies Reviewed: .    Echo 06/2022:  1. Left ventricular ejection fraction, by estimation, is 25 to 30%. The   left ventricle has severely decreased function. The left ventricle  demonstrates regional wall motion abnormalities (see scoring  diagram/findings for description). The left  ventricular internal cavity size was severely dilated. Left ventricular  diastolic parameters are consistent with Grade III diastolic dysfunction  (restrictive). Elevated left atrial pressure.   2. Ventricular septum is flattened in systole suggesting RV pressure  overload. . Right ventricular systolic function is mildly reduced. The  right ventricular size is normal. There is moderately elevated pulmonary  artery systolic pressure.   3. Left atrial size was severely dilated.   4. Functional eccentric posterior directed MR difficult to quantify, at  least moderate possibly severe MR. MV/AV VTI ratio 1.3 suggesting moderate  to severe MR. The mitral valve is abnormal. Moderate to severe mitral  valve regurgitation. No evidence of  mitral stenosis.   5. The tricuspid valve is abnormal. Tricuspid valve regurgitation is  moderate to severe.   6. The aortic valve is tricuspid. There is mild calcification of the  aortic valve. There is mild thickening of the aortic valve. Aortic valve  regurgitation is not visualized. No aortic stenosis is present.  Right heart cath 02/2022:  IMPRESSION: Severely elevated pre and post capillary filling pressures.  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    RECOMMENDATIONS: - Increase outpatient diuretics.  - Start evaluation for mitral valve repair/replacement.   TEE 02/2022:  1. Left ventricular ejection fraction, by estimation, is 35%. The left  ventricle has moderately decreased function. The left ventricular internal  cavity size was moderately to severely dilated measuring 7.1cm.   2. Right ventricular systolic function is moderately reduced. The right  ventricular size  is mildly enlarged.   3. Left atrial size was moderately dilated. No left atrial/left atrial  appendage thrombus was detected.   4. Right atrial size was mildly dilated.   5. There is severe posteriorly directed eccentric mitral valve  regurgitation secondary to restriction of the posterior mitral valve  leaflet and poor leaflet coaptation. There is blunting of right upper  pulmonary vein flow. BP during study 100s/60s.   6. Tricuspid valve regurgitation is mild to moderate.   7. The aortic valve is normal in structure. Aortic valve regurgitation is  not visualized.     Physical Exam:   VS:  LMP 09/05/2022 (Approximate)    Wt Readings from Last 3 Encounters:  10/04/22 238 lb (108 kg)  09/16/22 241 lb 12.8 oz (109.7 kg)  09/13/22 240 lb 1.3 oz (108.9 kg)    GEN: Obese, 47 y.o. female in no acute distress NECK: No JVD; No carotid bruits CARDIAC: S1/S2, RRR, Grade 2/6 murmur, no rubs, no gallops, Focal swelling noted to right chest wall under collar bone, enlarged and more prominent since last OV.  RESPIRATORY:  Clear to auscultation without rales, wheezing or rhonchi  ABDOMEN: Soft, non-tender, non-distended EXTREMITIES:  Generalized nonpitting edema; No deformity   ASSESSMENT AND PLAN: .    Chronic combined CHF, medication management Stage C, NYHA class I-II symptoms. TTE 06/2022 EF 25-30%, from 30-35%, grade 3 DD. Euvolemic and well compensated on exam.  Cardiac MRI has been arranged and ordered previously. Continue current GDMT, unable to further titrate d/t current BP. Low sodium diet, fluid restriction <2L, and daily weights encouraged. Educated to contact our office for weight gain of 2 lbs overnight or 5 lbs in one week.  Continue to follow-up with HF clinic. Will obtain BMET, Mag, and BNP in 1 week (were not obtained last visit) when she follows-up with PCP.    Hx of syncope, hx of NSVT Etiology felt to be vasovagal. Denies any recurrence or symptoms. Past monitor revealed  NSVT/PVC's. Continue current medication regimen.  Cardiac MRI has been previously arranged as mentioned above.  Heart healthy diet recommended. Continue to follow-up with EP.    CAD Stable with no anginal symptoms. No indication for ischemic evaluation.  Previous cardiac catheterization 05/2021 revealed nonobstructive CAD.  Not on aspirin due to being on Coumadin.  Continue atorvastatin, losartan, and Toprol-XL.  Heart healthy diet recommended.   HTN Blood pressure chronically soft, takes midodrine PRN for SBP < 90. Continue current medication regimen.   Discussed to monitor BP at home at least 2 hours after medications and sitting for 5-10 minutes. Heart healthy diet recommended.    Hx of PE/DVT Previous history of unprovoked saddle PE in 2015, hx of recurrent unprovoked DVT with hx of thrombectomy in 01/2021. Has IVC filter placed, could not be removed previously d/t significant thrombus burden on filter. Denies any bleeding issues while on Coumadin.  Continue current medication regimen and continue to follow-up with Coumadin clinic.  Has hx of recurrent left lower extremity DVT.    6. Pulmonary hypertension Etiology most likely d/t CHF. RHC revealed moderately elevated PA mean and PVR secondary to combined pre and postcapillary pulmonary hypertension, was recommended to increase outpatient diuretics and to start evaluation for mitral valve replacement/repair. Denies any recent [facility]. Continue Lasix, will arrange labs as mentioned above.    7. Severe  mitral valve regurgitation, tricuspid regurgitation TTE 06/2022 revealed moderate to severe MR, moderate to severe TR.  Will send in referral for structural heart team. Continue current medication regimen. Obtaining labs as mentioned above. Heart healthy diet encouraged.    8. Obesity  Weight loss via diet and exercise as tolerated encouraged. Discussed the impact being overweight would have on cardiovascular risk. Has lost almost 10 lbs since I last saw  her. Congratulated her on her weight loss.   9. Focal swelling along right chest wall, pre-procedure lab More pronounced and noticeable since last OV, noted below collar bone, has been there for several months. Will obtain CT of chest/neck for further evaluation and will obtain BMET prior to imaging.     Dispo: Will obtain labs that were not obtained from previous OV. Follow-up with me or APP in 6-8 weeks or sooner if anything changes.   Signed, Sharlene Dory, NP

## 2022-11-01 ENCOUNTER — Encounter (HOSPITAL_COMMUNITY): Payer: Medicare HMO | Admitting: Cardiology

## 2022-11-01 ENCOUNTER — Telehealth: Payer: Self-pay

## 2022-11-01 NOTE — Telephone Encounter (Signed)
Called patient concerning her reminder status to see if she needed to cancel/reschedule appointment. Left message on voicemail.

## 2022-11-04 ENCOUNTER — Ambulatory Visit: Payer: Medicare HMO | Admitting: Adult Health

## 2022-11-20 ENCOUNTER — Telehealth: Payer: Self-pay | Admitting: Family Medicine

## 2022-11-20 NOTE — Telephone Encounter (Signed)
PCS forms  Noted Copied Sleeved (put in provider box)  Fax to [phone] when completed

## 2022-11-22 ENCOUNTER — Other Ambulatory Visit: Payer: Self-pay | Admitting: Family Medicine

## 2022-11-24 NOTE — Patient Instructions (Signed)

## 2022-11-24 NOTE — Progress Notes (Unsigned)
   Established Patient Office Visit   Subjective  Patient ID: [name] [name], female    DOB: April 16, 1975  Age: 47 y.o. MRN: [id number]  No chief complaint on file.   She  has a past medical history of Anemia, Blood transfusion without reported diagnosis, CAD (coronary artery disease), Closed left ankle fracture, Clotting disorder (HCC), DVT (deep venous thrombosis) (HCC), Fibroid tumor, HFrEF (heart failure with reduced ejection fraction) (HCC), Hidradenitis, Hypertension, Mitral regurgitation, Myocardial infarction (HCC), NICM (nonischemic cardiomyopathy) (HCC), NSVT (nonsustained ventricular tachycardia) (HCC), Obesity, OSA on CPAP, Pulmonary embolism (HCC) (04/2013), and Sleep apnea.  HPI  ROS    Objective:     There were no vitals taken for this visit. {Vitals History (Optional):23777}  Physical Exam   No results found for any visits on 11/25/22.  The ASCVD Risk score (Arnett DK, et al., 2019) failed to calculate for the following reasons:   The valid total cholesterol range is 130 to 320 mg/dL    Assessment & Plan:  There are no diagnoses linked to this encounter.  No follow-ups on file.   Cruzita Lederer Newman Nip, FNP

## 2022-11-25 ENCOUNTER — Ambulatory Visit: Payer: Medicare HMO | Admitting: Family Medicine

## 2022-12-04 ENCOUNTER — Encounter: Payer: Self-pay | Admitting: Family Medicine

## 2022-12-30 ENCOUNTER — Ambulatory Visit: Payer: Medicare HMO | Attending: Cardiology | Admitting: Cardiology

## 2022-12-30 ENCOUNTER — Encounter: Payer: Self-pay | Admitting: Cardiology

## 2022-12-30 ENCOUNTER — Ambulatory Visit (INDEPENDENT_AMBULATORY_CARE_PROVIDER_SITE_OTHER): Payer: Medicare HMO | Admitting: *Deleted

## 2022-12-30 ENCOUNTER — Other Ambulatory Visit: Payer: Self-pay | Admitting: Cardiology

## 2022-12-30 VITALS — BP 118/62 | HR 70 | Ht 70.0 in | Wt 256.2 lb

## 2022-12-30 DIAGNOSIS — Z5181 Encounter for therapeutic drug level monitoring: Secondary | ICD-10-CM

## 2022-12-30 DIAGNOSIS — Z86711 Personal history of pulmonary embolism: Secondary | ICD-10-CM | POA: Diagnosis not present

## 2022-12-30 DIAGNOSIS — Z7901 Long term (current) use of anticoagulants: Secondary | ICD-10-CM | POA: Diagnosis not present

## 2022-12-30 DIAGNOSIS — Z86718 Personal history of other venous thrombosis and embolism: Secondary | ICD-10-CM

## 2022-12-30 DIAGNOSIS — I2510 Atherosclerotic heart disease of native coronary artery without angina pectoris: Secondary | ICD-10-CM | POA: Diagnosis not present

## 2022-12-30 DIAGNOSIS — I5042 Chronic combined systolic (congestive) and diastolic (congestive) heart failure: Secondary | ICD-10-CM | POA: Diagnosis not present

## 2022-12-30 LAB — POCT INR: INR: 1.3 — AB (ref 2.0–3.0)

## 2022-12-30 MED ORDER — TORSEMIDE 20 MG PO TABS: 40.0000 mg | ORAL_TABLET | Freq: Every day | ORAL | 6 refills | Status: DC

## 2022-12-30 NOTE — Patient Instructions (Addendum)
Medication Instructions:  Your physician has recommended you make the following change in your medication:  Stop furosemide Start torsemide 40 mg daily Continue all other medications as prescribed  Labwork: Please have your lab work done at [facility] 12/31/2022.   Testing/Procedures: none  Follow-Up: Your physician recommends that you schedule a follow-up appointment in: 6 months Your physician recommends that you schedule a follow-up appointment as soon as possible with Dr. Gasper Lloyd.  Any Other Special Instructions Will Be Listed Below (If Applicable).  If you need a refill on your cardiac medications before your next appointment, please call your pharmacy.

## 2022-12-30 NOTE — Patient Instructions (Signed)
Take warfarin 1 1/2 tablets tonight and tomorrow night then resume 1 tablet daily except 1/2 tablet on Sundays and Thursdays Recheck in 1 wks

## 2022-12-30 NOTE — Telephone Encounter (Signed)
Refill request for warfarin:  Last INR was 2.7 on 10/09/22 INR past due LOV was 09/09/22  Pt need MD appt and INR appt.  Message sent to schedulers.  Refill approved x 15 tablets only.

## 2022-12-30 NOTE — Progress Notes (Signed)
Clinical Summary Ms. Oblander is a 47 y.o.femaleseen today for follow up of the following medical problems.    1. History of pulmonary embolism/Pulmonary HTN - history of unprovoked saddle PE in 04/2013. Followed by hematology, plans for lifelong anticoagulation. Has been on coumadin. From [facility] hematology notes did not recommend a DOAC, stating have not been well studied in patients over 250 lbs.  - 04/2013 LVEF 65-70%, grade I diastolic dysfunction. Flattening of interventricular septum with mild RV dysfunction, PASP 53.  -repeat CT did show near complete lysis of prior extensive PE.    05/2021 RHC: mean PA 30, PCWP 24,PVR 0.7 wood units 09/2022 RHC: mean PA 33, PCWP 21, CI 2.04 -no bleeding on couadain     2. HTN - she is compliant with meds   3. Anemia - followed by hematology - on iron infusions.    4.Chronc combined systolic/diastolic HF -  Echocardiogram 1/61/09 showed EF mildly down to 45-50%, mild LVH  - Underwent R/L heart cath on 06/19/21 that showed 40-50% proximal right coronary narrowing, otherwise normal coronary arteries. Also showed significant elevation in LVEDP, consistent with acute on chronic combince systolic and diastolic heart failure. 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  09/2022 RHC: mean PA 33, PCWP 21, CI 2.04  - ongoing LVAD workup by HF clinic - 10/2022 missed HF clinic appt  - recent family illnesses has missed appointments - some recent edema. Typically takes lasix 80mg  bid, has increased to 100mg  in  AM and 80mg   - some recent LE edema, resolving with with higher lasix. Home weight was up to 256 lbs - some orthopnea at home. DOE at about 1/2 blocks, similar to her baseline she reports    5. Severe functional MR - no showed for 2 appointment with structural heart team to consider TEER      5. History of respiratory failure - admit 05/2021 requiring intubation in setting of pneumonia, HF   6. CAD - 05/2021 cath 50% prox RCA, LVEDP 30 - no chest pains.  08/2022 TC 115 TG 56 HDL 48 LDL 54   7. History of DVT/PE - she is on coumadin, her hematolgist has favored coumadin over DOACs for her historically  Past Medical History:  Diagnosis Date   Anemia    Blood transfusion without reported diagnosis    CAD (coronary artery disease)    Nonobstructive   Closed left ankle fracture    August 30 2012   Clotting disorder (HCC)    DVT (deep venous thrombosis) (HCC)    L leg   Fibroid tumor    HFrEF (heart failure with reduced ejection fraction) (HCC)    Hidradenitis    Hypertension    Mitral regurgitation    Myocardial infarction (HCC)    NICM (nonischemic cardiomyopathy) (HCC)    NSVT (nonsustained ventricular tachycardia) (HCC)    Obesity    OSA on CPAP    Pulmonary embolism (HCC) 04/2013   Sleep apnea      Allergies  Allergen Reactions  Sulfa Antibiotics Itching, Swelling and Rash    Lip swelling     Current Outpatient Medications  Medication Sig Dispense Refill   albuterol (VENTOLIN HFA) 108 (90 Base) MCG/ACT inhaler Inhale 2 puffs into the lungs every 2 (two) hours as needed for wheezing or shortness of breath. 18 g 11   atorvastatin (LIPITOR) 20 MG tablet Take 1 tablet (20 mg total) by mouth daily. 90 tablet 3   cyclobenzaprine (FLEXERIL) 10 MG tablet Take 1 tablet (10 mg total) by mouth 3 (three) times daily as needed for muscle spasms. 30 tablet 0   empagliflozin (JARDIANCE) 10 MG TABS tablet Take 1 tablet (10 mg total) by mouth daily before breakfast. 90 tablet  3   furosemide (LASIX) 40 MG tablet Take 2 tablets (80 mg total) by mouth 2 (two) times daily. 120 tablet 3   Iron, Ferrous Sulfate, 325 (65 Fe) MG TABS Take 325 mg by mouth daily. (Patient not taking: Reported on 09/27/2022) 30 tablet 3   losartan (COZAAR) 25 MG tablet Take 1 tablet (25 mg total) by mouth daily.     metoprolol succinate (TOPROL-XL) 25 MG 24 hr tablet Take 1 tablet (25 mg total) by mouth daily. Take with or immediately following a meal. 90 tablet 3   midodrine (PROAMATINE) 2.5 MG tablet Take 1 tablet (2.5 mg total) by mouth 2 (two) times daily as needed with a meal for Low BP 60 tablet 6   potassium chloride SA (KLOR-CON M) 20 MEQ tablet Take 20 mEq by mouth 2 (two) times daily.     spironolactone (ALDACTONE) 25 MG tablet Take 1 tablet (25 mg total) by mouth daily. 90 tablet 3   warfarin (COUMADIN) 10 MG tablet TAKE 1/2 TO 1 TABLET BY MOUTH DAILY AS DIRECTED. MUST COME TO APPOINTMENT FOR REFILLS 15 tablet 0   No current facility-administered medications for this visit.     Past Surgical History:  Procedure Laterality Date   ABSCESS DRAINAGE Bilateral 01/10/2015   BREAST BIOPSY Right 02/01/2020   benign   COLONOSCOPY WITH PROPOFOL N/A 06/08/2018   Procedure: COLONOSCOPY WITH PROPOFOL;  Surgeon: Corbin Ade, MD;  Location: [facility];  Service: Endoscopy;  Laterality: N/A;  2:30pm   CYST REMOVAL TRUNK     IVC FILTER PLACEMENT ([facility] HX)     LOWER EXTREMITY VENOGRAPHY N/A 02/07/2021   Procedure: LOWER EXTREMITY VENOGRAPHY;  Surgeon: Victorino Sparrow, MD;  Location: [facility] INVASIVE CV LAB;  Service: Cardiovascular;  Laterality: N/A;   PERIPHERAL VASCULAR THROMBECTOMY N/A 02/07/2021   Procedure: PERIPHERAL VASCULAR THROMBECTOMY;  Surgeon: Victorino Sparrow, MD;  Location: [facility] INVASIVE CV LAB;  Service: Cardiovascular;  Laterality: N/A;   RIGHT HEART CATH N/A 03/08/2022   Procedure: RIGHT HEART CATH;  Surgeon: Dorthula Nettles, DO;  Location: [facility] INVASIVE CV LAB;  Service:  Cardiovascular;  Laterality: N/A;   RIGHT HEART CATH N/A 10/04/2022   Procedure: RIGHT HEART CATH;  Surgeon: Dorthula Nettles, DO;  Location: [facility] INVASIVE CV LAB;  Service: Cardiovascular;  Laterality: N/A;   RIGHT/LEFT HEART CATH AND CORONARY ANGIOGRAPHY N/A 06/19/2021   Procedure: RIGHT/LEFT HEART CATH AND CORONARY ANGIOGRAPHY;  Surgeon: Lyn Records, MD;  Location: [facility] INVASIVE CV LAB;  Service: Cardiovascular;  Laterality: N/A;   TEE WITHOUT CARDIOVERSION N/A 03/08/2022   Procedure: TRANSESOPHAGEAL ECHOCARDIOGRAM (TEE);  Surgeon: Dorthula Nettles, DO;  Location: [facility] ENDOSCOPY;  Service: Cardiovascular;  Laterality: N/A;     Allergies  Allergen Reactions   Sulfa Antibiotics Itching, Swelling and Rash  Lip swelling      Family History  Problem Relation Age of Onset   Hypertension Mother    Diabetes Paternal Aunt    Diabetes Paternal Uncle    Other Father        blood clots   Cancer Maternal Grandmother    Heart attack Paternal Grandmother    Colon cancer Neg Hx      Social History Ms. Mobbs reports that she has been smoking cigarettes. She has never been exposed to tobacco smoke. She has never used smokeless tobacco. Ms. Bargas reports current alcohol use of about 2.0 standard drinks of alcohol per week.   Review of Systems CONSTITUTIONAL: No weight loss, fever, chills, weakness or fatigue.  HEENT: Eyes: No visual loss, blurred vision, double vision or yellow sclerae.No hearing loss, sneezing, congestion, runny nose or sore throat.  SKIN: No rash or itching.  CARDIOVASCULAR: per hpi RESPIRATORY: per hpi GASTROINTESTINAL: No anorexia, nausea, vomiting or diarrhea. No abdominal pain or blood.  GENITOURINARY: No burning on urination, no polyuria NEUROLOGICAL: No headache, dizziness, syncope, paralysis, ataxia, numbness or tingling in the extremities. No change in bowel or bladder control.  MUSCULOSKELETAL: No muscle, back pain, joint pain or stiffness.  LYMPHATICS: No  enlarged nodes. No history of splenectomy.  PSYCHIATRIC: No history of depression or anxiety.  ENDOCRINOLOGIC: No reports of sweating, cold or heat intolerance. No polyuria or polydipsia.  Marland Kitchen   Physical Examination Today's Vitals   12/30/22 1605  BP: 118/62  Pulse: 70  SpO2: 98%  Weight: 256 lb 3.2 oz (116.2 kg)  Height: 5\' 10"  (1.778 m)   Body mass index is 36.76 kg/m.  Gen: resting comfortably, no acute distress HEENT: no scleral icterus, pupils equal round and reactive, no palptable cervical adenopathy,  CV: RRR, 3/6 systolic murmur apex, no jvd Resp: Clear to auscultation bilaterally GI: abdomen is soft, non-tender, non-distended, normal bowel sounds, no hepatosplenomegaly MSK: extremities are warm, no edema.  Skin: warm, no rash Neuro:  no focal deficits Psych: appropriate affect   Diagnostic Studies  04/2013 echo Study Conclusions  - Study data: Technically difficult study. - Procedure narrative: Transthoracic echocardiography. Image   quality was adequate. The study was technically difficult,   as a result of body habitus. - Left ventricle: The cavity size was normal. Wall thickness   was increased in a pattern of moderate LVH. Systolic   function was vigorous. The estimated ejection fraction was   in the range of 65% to 70%. Wall motion was normal; there   were no regional wall motion abnormalities. Doppler   parameters are consistent with abnormal left ventricular   relaxation (grade 1 diastolic dysfunction). - Aortic valve: Valve area: 2.01cm^2(VTI). Valve area:   2.11cm^2 (Vmax). - Right ventricle: There is systolic flattening of the   interventricular septum consistent with RV pressure   overload. The cavity size was severely dilated. Systolic   function was mildly reduced. - Pulmonary arteries: Systolic pressure was moderately   increased. PA peak pressure: 53mm Hg (S).     08/2017 echo Study Conclusions   - Left ventricle: The cavity size was  normal. Wall thickness was   increased in a pattern of mild LVH. Systolic function was normal.   The estimated ejection fraction was in the range of 60% to 65%.   Wall motion was normal; there were no regional wall motion   abnormalities. Left ventricular diastolic function parameters   were normal.     Echocardiogram 06/05/21  1. Left  ventricular ejection fraction, by estimation, is 45 to 50%. The  left ventricle has mildly decreased function. The left ventricle  demonstrates global hypokinesis. The left ventricular internal cavity size  was moderately dilated. There is mild  left ventricular hypertrophy. Left ventricular diastolic parameters are  indeterminate.   2. Right ventricular systolic function is normal. The right ventricular  size is normal. There is severely elevated pulmonary artery systolic  pressure.   3. Left atrial size was severely dilated.   4. Right atrial size was mildly dilated.   5. The mitral valve is abnormal. Mild mitral valve regurgitation. No  evidence of mitral stenosis.   6. The tricuspid valve is abnormal.   7. The aortic valve is tricuspid. Aortic valve regurgitation is not  visualized. No aortic stenosis is present.   8. The inferior vena cava is normal in size with greater than 50%  respiratory variability, suggesting right atrial pressure of 3 mmHg.      05/2021 RHC/LHC CONCLUSIONS: 40 to 50% proximal right coronary narrowing. Right dominant anatomy Coronary arteries otherwise normal Significant elevation LVEDP 30 mmHg consistent with acute on chronic combined systolic and diastolic heart failure Mild pulmonary hypertension, mean pressure 30 mmHg.  WHO group II etiology based on hemodysnamics.. Capillary wedge mean 24 mmHg.  V wave to 40 mmHg suggesting some degree of mitral regurgitation. Cardiac output 8.5 L/min with index 3.57 L/min/m. Pulmonary resistance 0.7 Wood units.   RECOMMENDATIONS: Care with IV fluid administration. Needs  diuresis. Start heart failure therapy including ARNI and SGLT2 as tolerated.     Assessment and Plan   1. Chronic combined systolic/diastolic HF - recent fluid overload, improved with her increase lasix to 100mg  in am and 80mg  at home. Requiring high doses of lasix, will go ahead and change to torsemide 40mg  bid - missed HF clinci appointments due to familiy illnesses, from last notes starting to pursue LVAD workup. We will have her reestablsih with HF clinic      2. Pulmonary HTN - group II pulmonary HTN based on recent RHC - continue management of her HF  3. History of DVT/PE - followed by hematology, they have favored coumadin over DOACs given her history  4. CAD - no symptoms - continue current meds  F/u 6 months. Will have her reestablsih with HF clinic to resume closer f/u with them  Antoine Poche, M.D.,

## 2022-12-31 ENCOUNTER — Encounter (HOSPITAL_COMMUNITY): Payer: Self-pay | Admitting: Hematology

## 2022-12-31 ENCOUNTER — Other Ambulatory Visit (HOSPITAL_COMMUNITY)
Admission: RE | Admit: 2022-12-31 | Discharge: 2022-12-31 | Disposition: A | Discharge status: Home / Self Care | Payer: Medicare HMO | Source: Ambulatory Visit | Attending: Cardiology | Admitting: Cardiology

## 2022-12-31 DIAGNOSIS — I5020 Unspecified systolic (congestive) heart failure: Secondary | ICD-10-CM | POA: Insufficient documentation

## 2022-12-31 DIAGNOSIS — I5042 Chronic combined systolic (congestive) and diastolic (congestive) heart failure: Secondary | ICD-10-CM | POA: Diagnosis present

## 2022-12-31 DIAGNOSIS — Z01812 Encounter for preprocedural laboratory examination: Secondary | ICD-10-CM | POA: Insufficient documentation

## 2022-12-31 DIAGNOSIS — Z1379 Encounter for other screening for genetic and chromosomal anomalies: Secondary | ICD-10-CM | POA: Insufficient documentation

## 2022-12-31 DIAGNOSIS — Z01818 Encounter for other preprocedural examination: Secondary | ICD-10-CM | POA: Insufficient documentation

## 2022-12-31 LAB — CBC WITH DIFFERENTIAL/PLATELET
Abs Immature Granulocytes: 0.01 10*3/uL (ref 0.00–0.07)
Basophils Absolute: 0.1 10*3/uL (ref 0.0–0.1)
Basophils Relative: 2 %
Eosinophils Absolute: 0.2 10*3/uL (ref 0.0–0.5)
Eosinophils Relative: 4 %
HCT: 32.9 % — ABNORMAL LOW (ref 36.0–46.0)
Hemoglobin: 9.6 g/dL — ABNORMAL LOW (ref 12.0–15.0)
Immature Granulocytes: 0 %
Lymphocytes Relative: 21 %
Lymphs Abs: 0.8 10*3/uL (ref 0.7–4.0)
MCH: 23 pg — ABNORMAL LOW (ref 26.0–34.0)
MCHC: 29.2 g/dL — ABNORMAL LOW (ref 30.0–36.0)
MCV: 78.7 fL — ABNORMAL LOW (ref 80.0–100.0)
Monocytes Absolute: 0.4 10*3/uL (ref 0.1–1.0)
Monocytes Relative: 11 %
Neutro Abs: 2.4 10*3/uL (ref 1.7–7.7)
Neutrophils Relative %: 62 %
Platelets: 237 10*3/uL (ref 150–400)
RBC: 4.18 MIL/uL (ref 3.87–5.11)
RDW: 20.9 % — ABNORMAL HIGH (ref 11.5–15.5)
WBC: 3.8 10*3/uL — ABNORMAL LOW (ref 4.0–10.5)
nRBC: 0 % (ref 0.0–0.2)

## 2022-12-31 LAB — MAGNESIUM: Magnesium: 2 mg/dL (ref 1.7–2.4)

## 2022-12-31 LAB — COMPREHENSIVE METABOLIC PANEL
ALT: 16 U/L (ref 0–44)
AST: 19 U/L (ref 15–41)
Albumin: 3.2 g/dL — ABNORMAL LOW (ref 3.5–5.0)
Alkaline Phosphatase: 85 U/L (ref 38–126)
Anion gap: 11 (ref 5–15)
BUN: 22 mg/dL — ABNORMAL HIGH (ref 6–20)
CO2: 28 mmol/L (ref 22–32)
Calcium: 8.9 mg/dL (ref 8.9–10.3)
Chloride: 100 mmol/L (ref 98–111)
Creatinine, Ser: 1.13 mg/dL — ABNORMAL HIGH (ref 0.44–1.00)
GFR, Estimated: >60 mL/min (ref >60)
Glucose, Bld: 117 mg/dL — ABNORMAL HIGH (ref 70–99)
Potassium: 3.4 mmol/L — ABNORMAL LOW (ref 3.5–5.1)
Sodium: 139 mmol/L (ref 135–145)
Total Bilirubin: 0.8 mg/dL (ref <1.2)
Total Protein: 7.1 g/dL (ref 6.5–8.1)

## 2022-12-31 LAB — PREALBUMIN: Prealbumin: 14 mg/dL — ABNORMAL LOW (ref 18–38)

## 2022-12-31 LAB — PROTIME-INR
INR: 1.2 (ref 0.8–1.2)
Prothrombin Time: 15.5 s — ABNORMAL HIGH (ref 11.4–15.2)

## 2022-12-31 LAB — HEPATITIS B SURFACE ANTIBODY,QUALITATIVE: Hep B S Ab: NONREACTIVE

## 2022-12-31 LAB — ABO/RH: ABO/RH(D): A POS

## 2022-12-31 LAB — APTT: aPTT: 27 s (ref 24–36)

## 2022-12-31 LAB — HIV ANTIBODY (ROUTINE TESTING W REFLEX): HIV Screen 4th Generation wRfx: NONREACTIVE

## 2022-12-31 LAB — LACTATE DEHYDROGENASE: LDH: 133 U/L (ref 98–192)

## 2022-12-31 LAB — ANTITHROMBIN III: AntiThromb III Func: 77 % (ref 75–120)

## 2022-12-31 LAB — HEPATITIS C ANTIBODY: HCV Ab: NONREACTIVE

## 2022-12-31 LAB — URIC ACID: Uric Acid, Serum: 7.4 mg/dL — ABNORMAL HIGH (ref 2.5–7.1)

## 2022-12-31 LAB — HEPATITIS B CORE ANTIBODY, TOTAL: Hep B Core Total Ab: NONREACTIVE

## 2022-12-31 LAB — HEPATITIS B SURFACE ANTIGEN: Hepatitis B Surface Ag: NONREACTIVE

## 2022-12-31 LAB — BRAIN NATRIURETIC PEPTIDE: B Natriuretic Peptide: 1296 pg/mL — ABNORMAL HIGH (ref 0.0–100.0)

## 2023-01-01 LAB — LUPUS ANTICOAGULANT PANEL
DRVVT: 38.5 s (ref 0.0–47.0)
PTT Lupus Anticoagulant: 31.6 s (ref 0.0–43.5)

## 2023-01-03 LAB — FACTOR 5 LEIDEN

## 2023-01-07 ENCOUNTER — Ambulatory Visit: Payer: Medicare HMO | Attending: Cardiology | Admitting: *Deleted

## 2023-01-07 DIAGNOSIS — Z86711 Personal history of pulmonary embolism: Secondary | ICD-10-CM | POA: Diagnosis not present

## 2023-01-07 DIAGNOSIS — Z5181 Encounter for therapeutic drug level monitoring: Secondary | ICD-10-CM | POA: Diagnosis not present

## 2023-01-07 DIAGNOSIS — Z86718 Personal history of other venous thrombosis and embolism: Secondary | ICD-10-CM

## 2023-01-07 LAB — POCT INR: INR: 1.5 — AB (ref 2.0–3.0)

## 2023-01-07 NOTE — Patient Instructions (Signed)
Take warfarin 1 1/2 tablets tonight then increase dose to 1 tablet daily Recheck in 3 wks

## 2023-01-11 IMAGING — DX DG CHEST 2V
2 series · 2 of 2 positions shown · non-contrast
Comparison: 06/04/2021

CLINICAL DATA: Cough

EXAM:
CHEST - 2 VIEW

[chest pa]
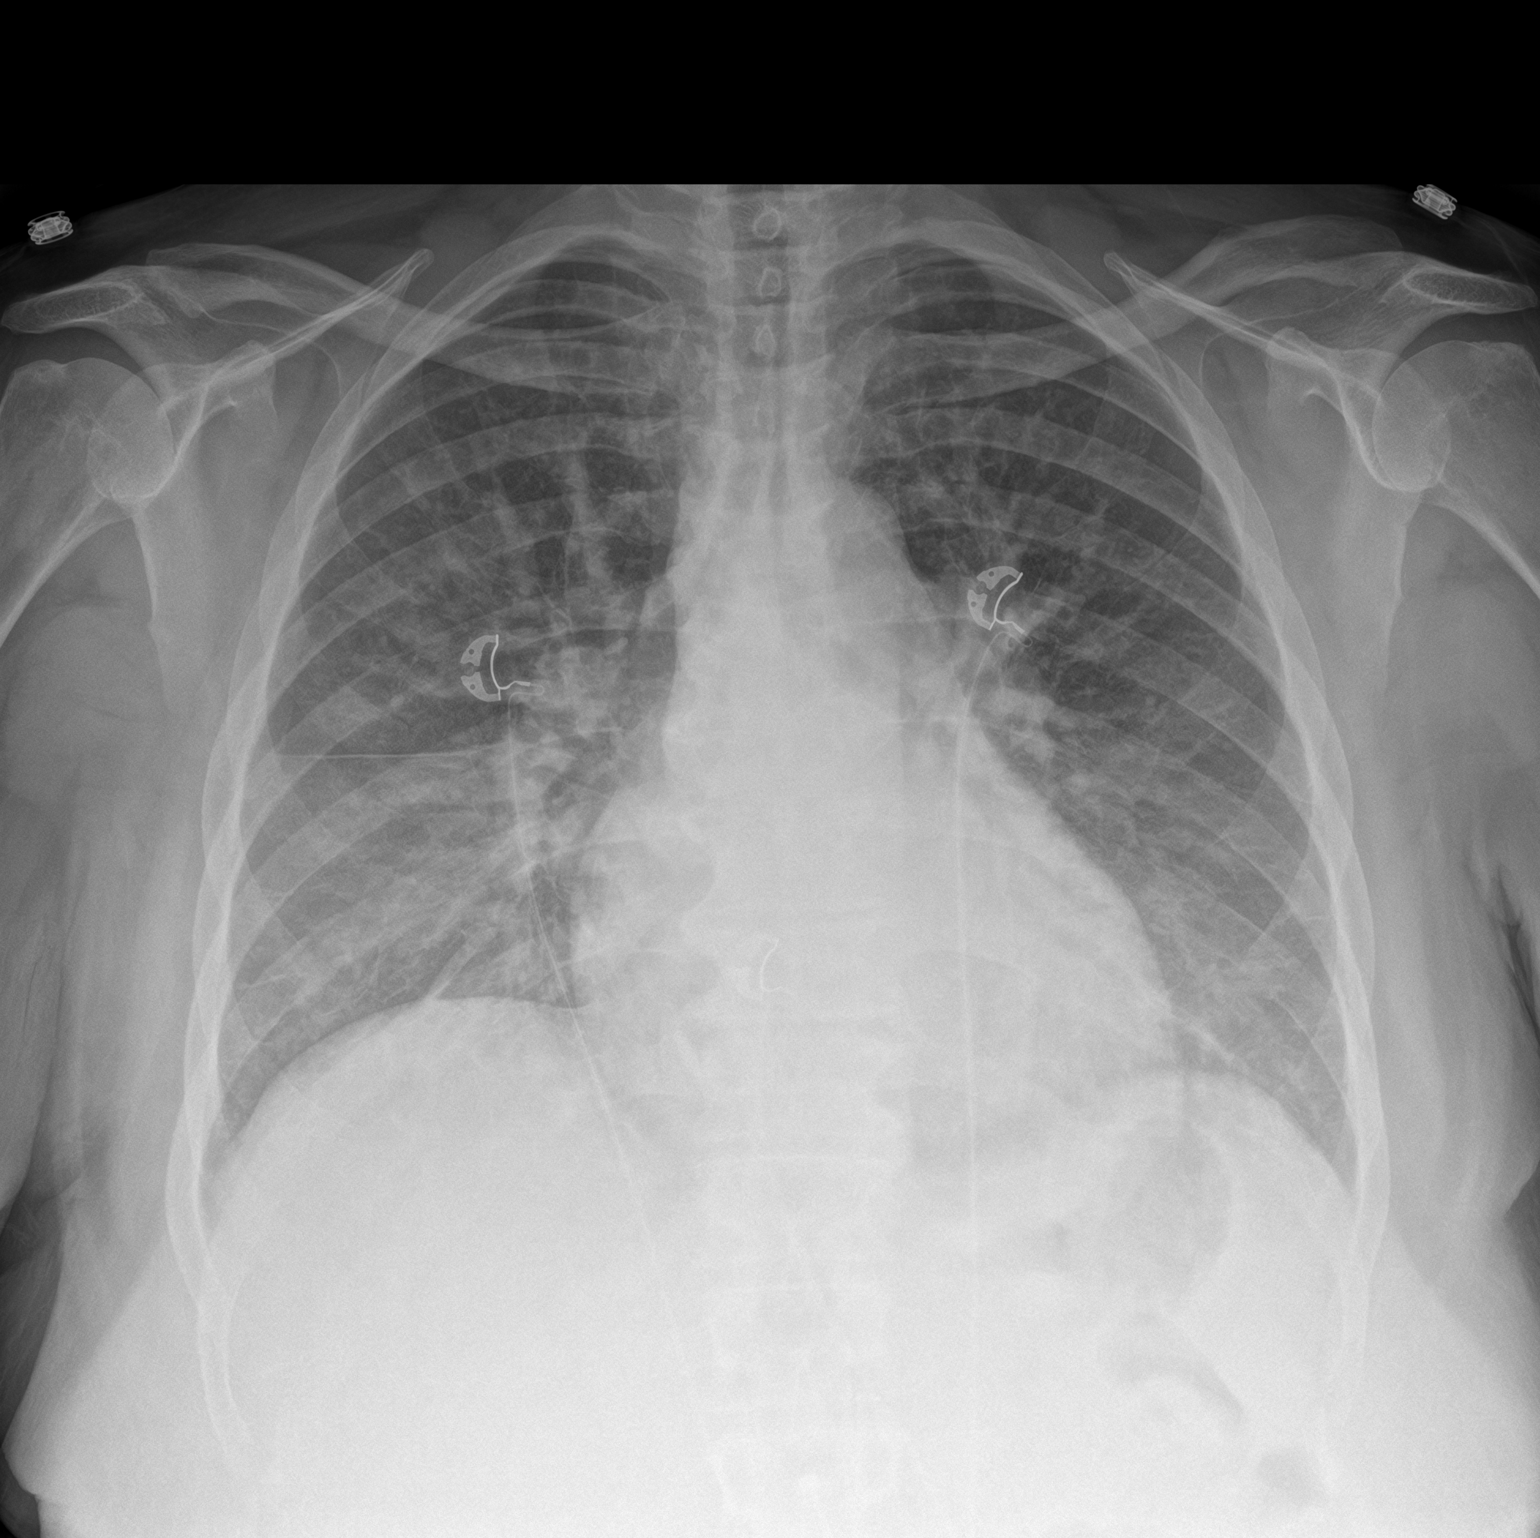

[chest lat]
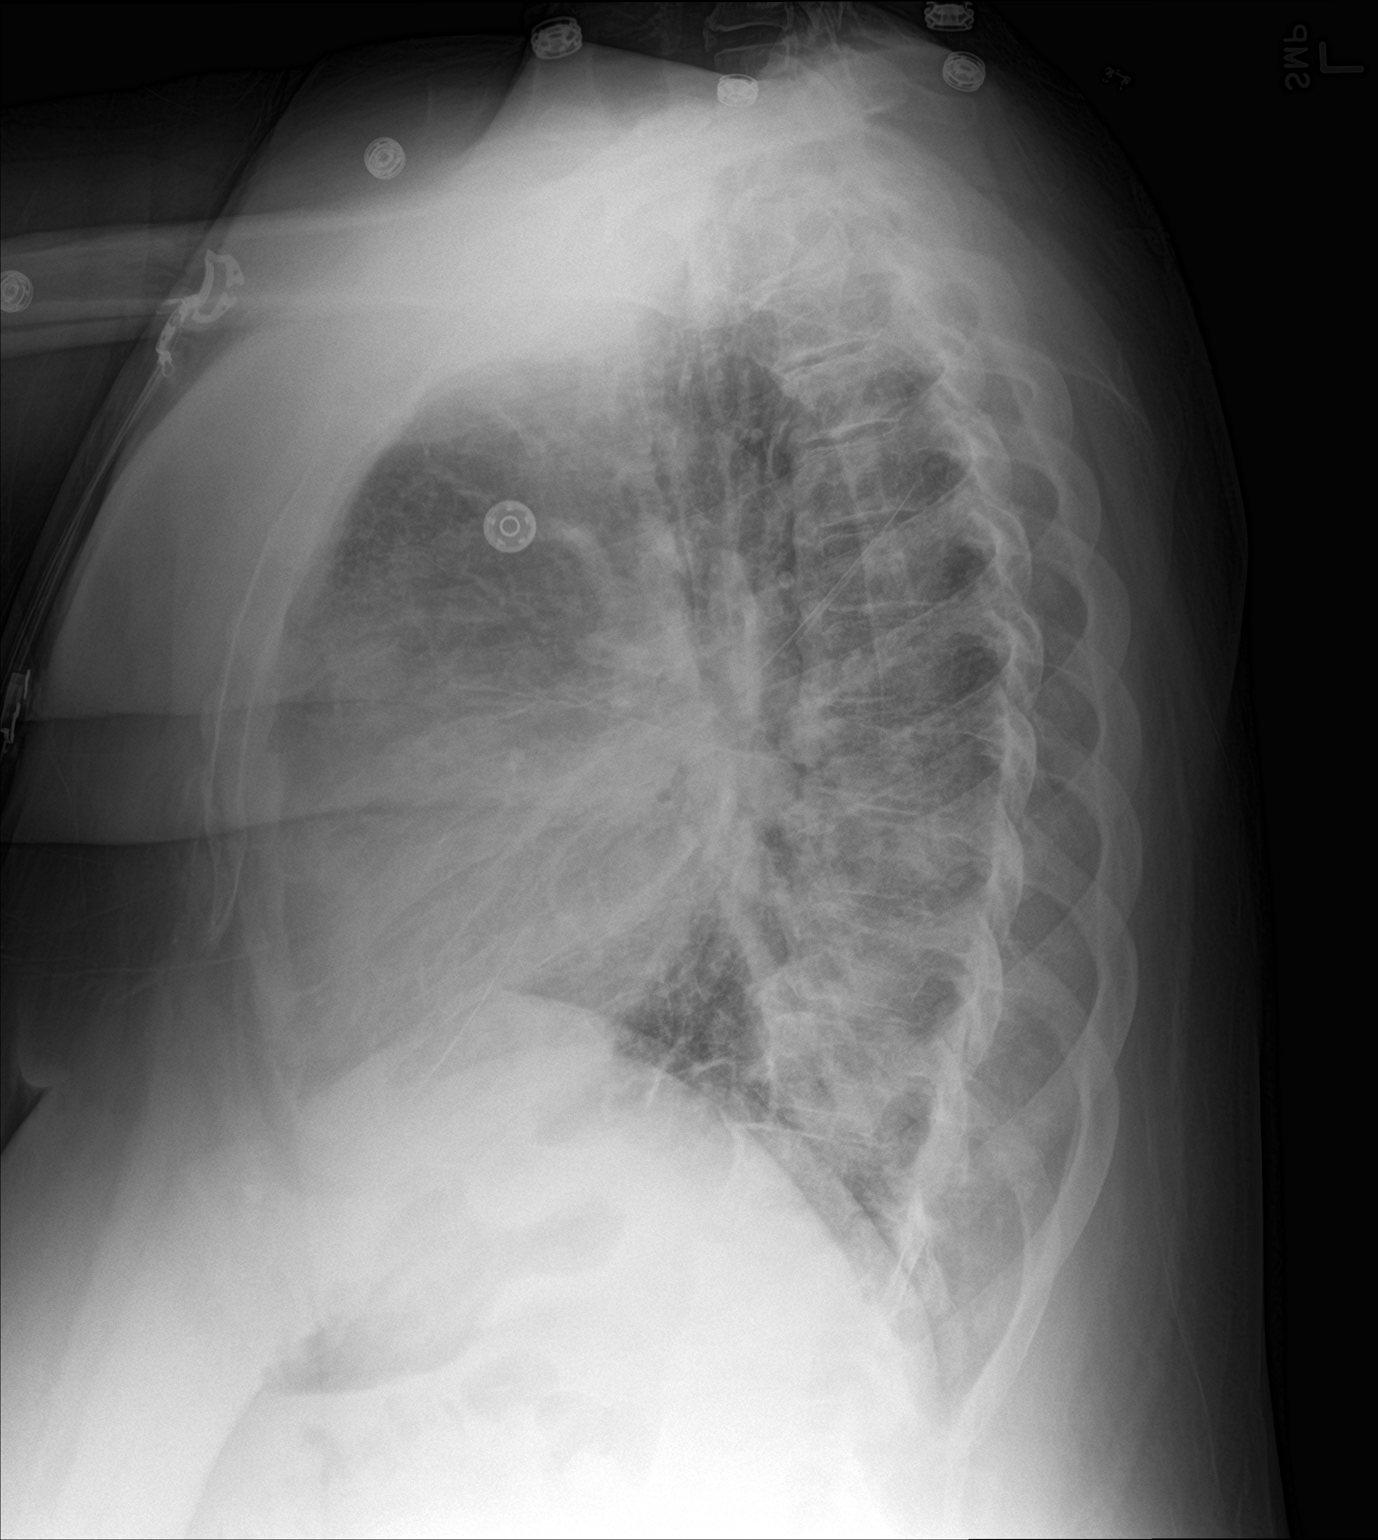

[2 of 2 positions shown; findings below may reference images not displayed]

FINDINGS: Stable heart size. Mild pulmonary vascular congestion with bilateral
perihilar and bibasilar interstitial opacities. Slightly improving
aeration within the left lung base compared to prior. No pleural
effusion or pneumothorax.
IMPRESSION: Persistent bilateral interstitial opacities with slightly improving
aeration within the left lung base compared to prior.

## 2023-01-16 IMAGING — DX DG CHEST 1V PORT
1 series · 1 of 1 positions shown · non-contrast
Comparison: Chest radiographs 06/07/2021. CT angiogram chest
06/05/2021.

CLINICAL DATA: Provided history: Pulmonary edema, acute.

EXAM:
PORTABLE CHEST 1 VIEW

[chest ap]
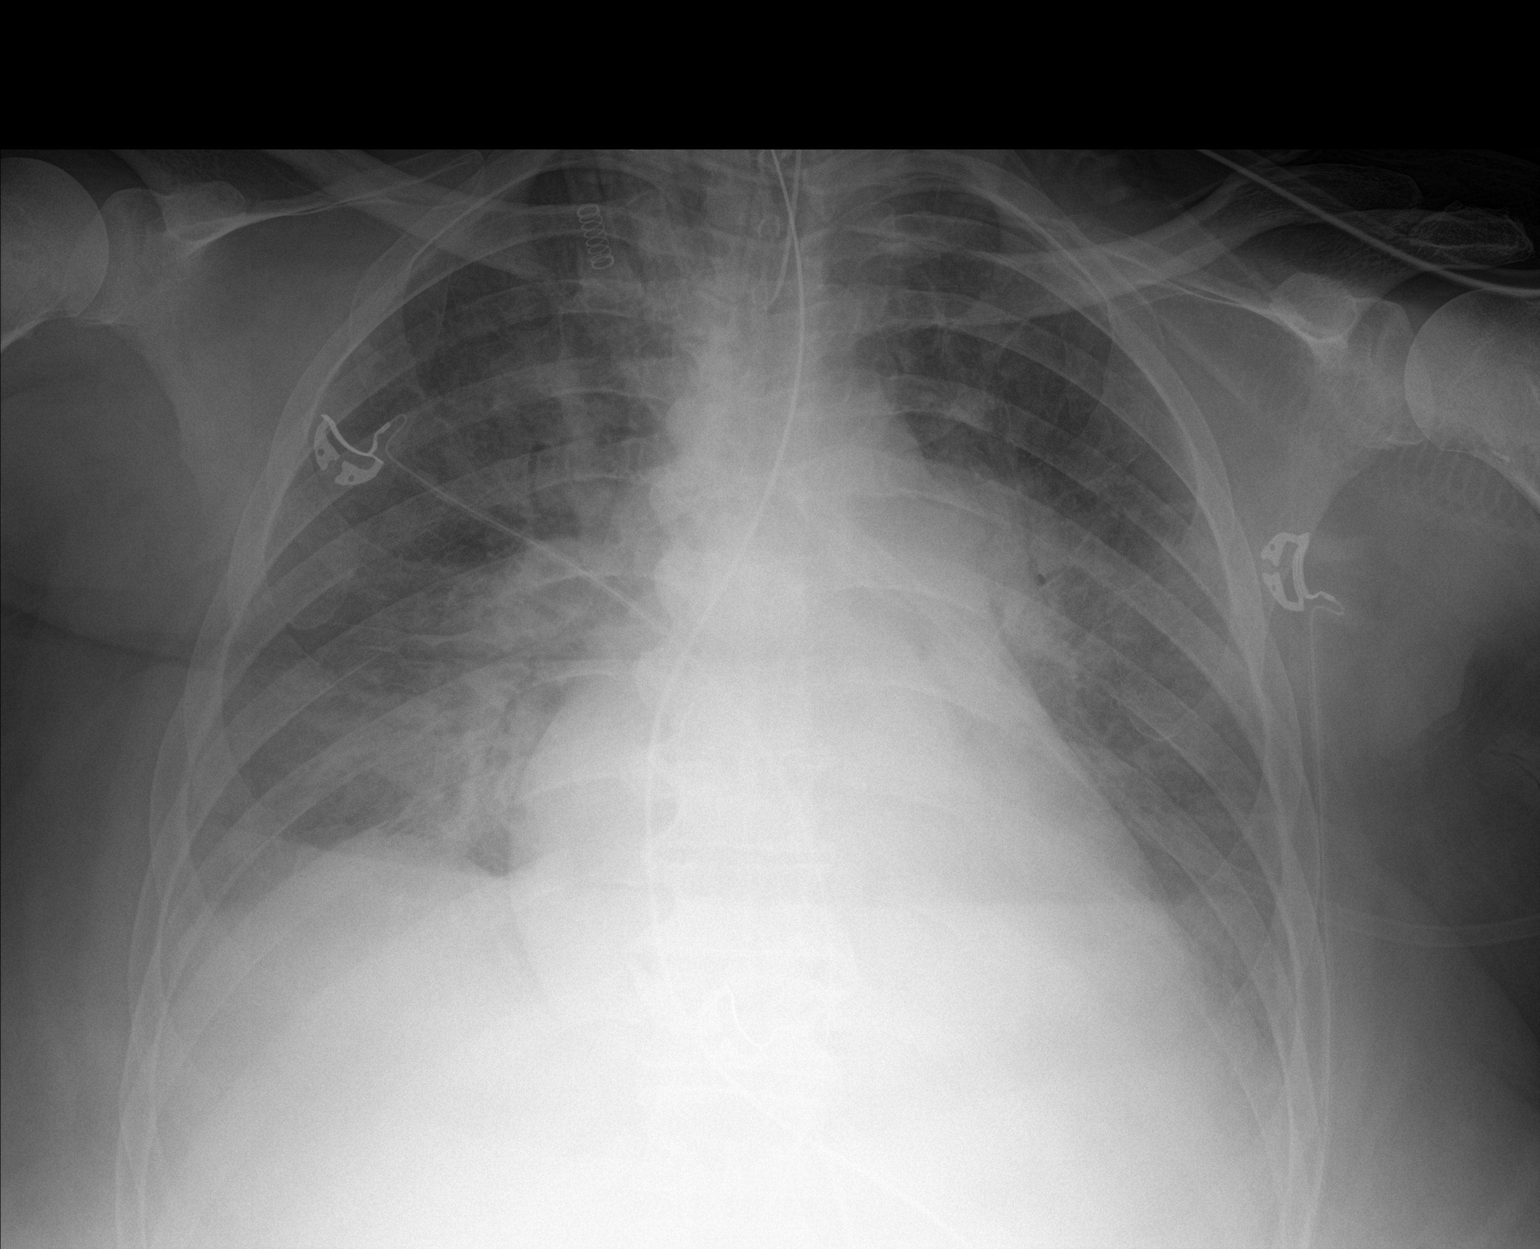

[1 of 1 positions shown; findings below may reference images not displayed]

FINDINGS: Interval intubation. The ET tube terminates at the level of the
clavicular heads. Interval placement of an enteric tube. The enteric
tube passes below the level of the left hemidiaphragm with tip
excluded from the field of view.

Cardiomegaly. Interstitial and ill-defined airspace opacities
throughout both lungs, progressed from the prior chest radiographs
of 06/07/2021. No definite pleural effusion or evidence of
pneumothorax. No acute bony abnormality identified.
IMPRESSION: Support apparatus as described.

Cardiomegaly.

Interstitial and ill-defined airspace opacities throughout both
lungs, progressed from the prior chest radiographs of 06/07/2021.
Primary differential considerations include pulmonary edema or
atypical/viral pneumonia.

## 2023-01-17 IMAGING — DX DG CHEST 1V PORT
1 series · 1 of 1 positions shown · non-contrast
Comparison: 06/12/2021

CLINICAL DATA: Pneumonia follow-up

EXAM:
PORTABLE CHEST 1 VIEW

[chest ap]
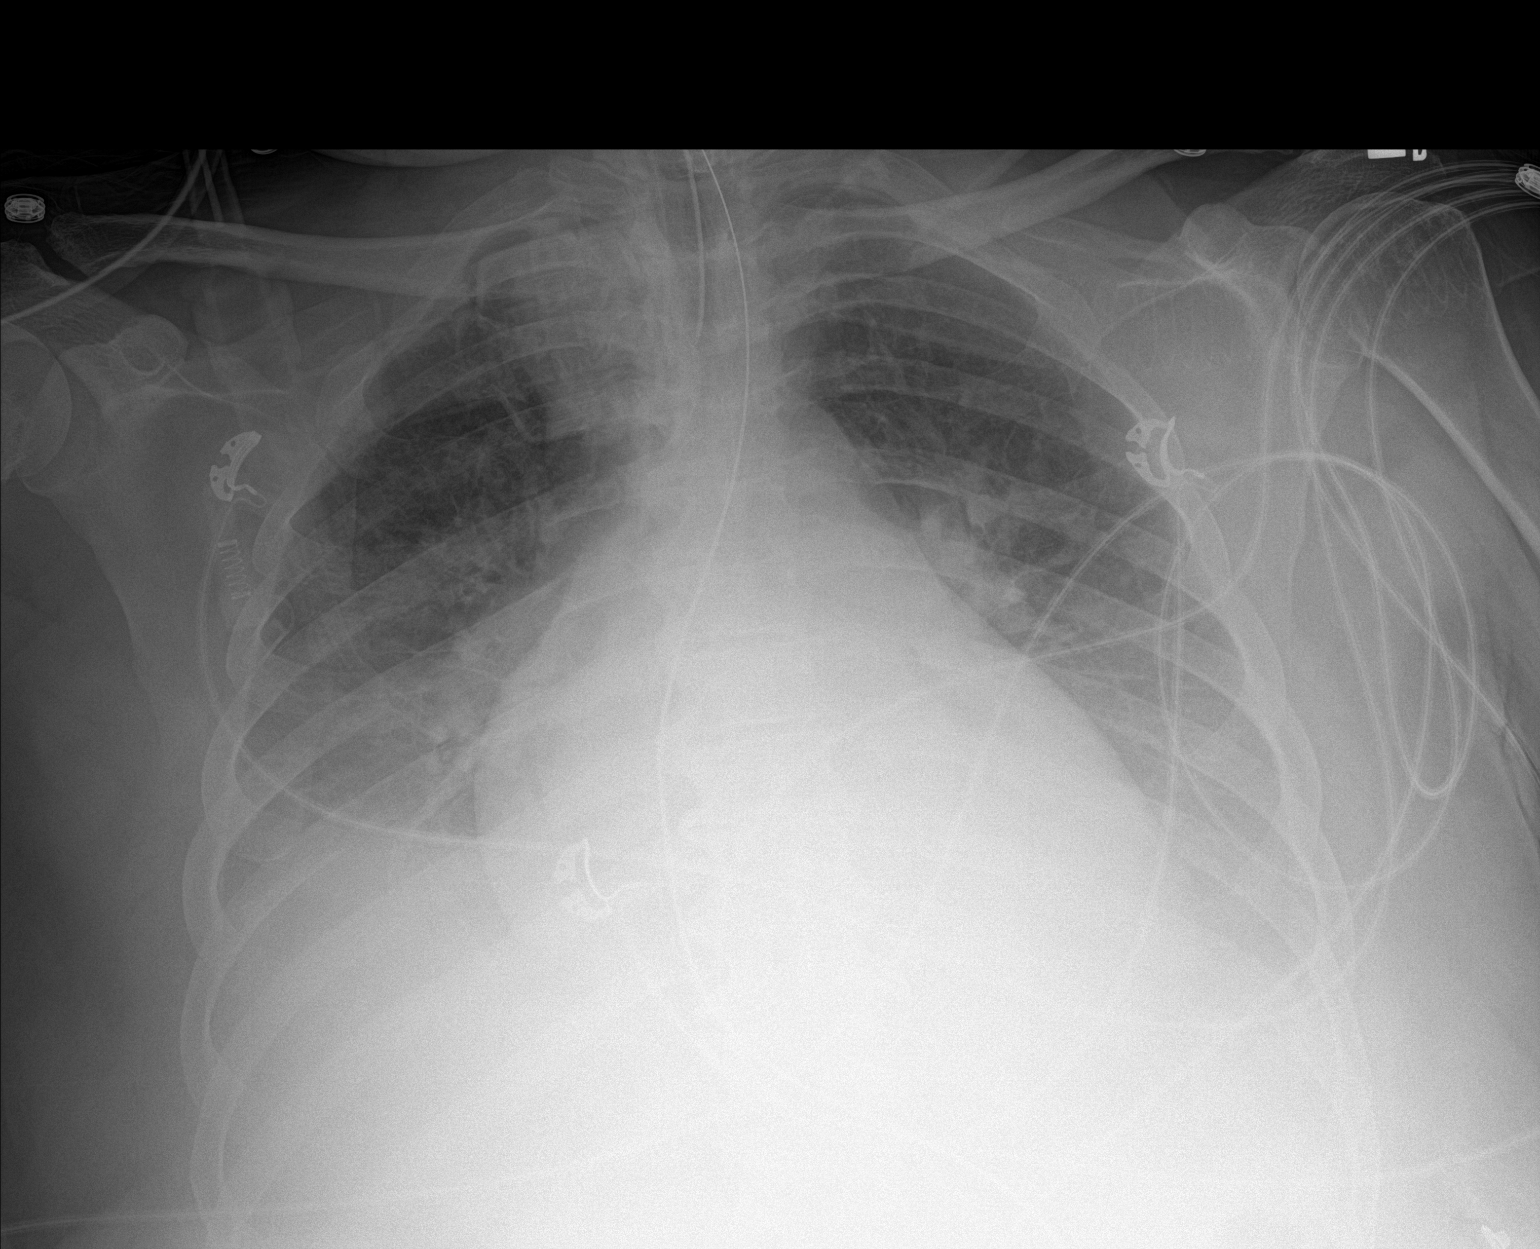

[1 of 1 positions shown; findings below may reference images not displayed]

FINDINGS: Endotracheal and enteric tubes are again identified. Similar
cardiomegaly. Patchy pulmonary opacities remain present. Aeration is
slightly improved. No significant pleural effusions.
IMPRESSION: Persistent patchy bilateral pulmonary opacities with some
improvement in aeration since 06/12/2021.

## 2023-01-18 IMAGING — DX DG CHEST 1V PORT
1 series · 2 of 2 positions shown · non-contrast
Comparison: June 13, 2021.

CLINICAL DATA: A 45-year-old female presents for evaluation of
pneumonia.

EXAM:
PORTABLE CHEST 1 VIEW

[Series 1: chest ap · 0.14mm/px · 2 of 2 slices shown]
[im 1/2]
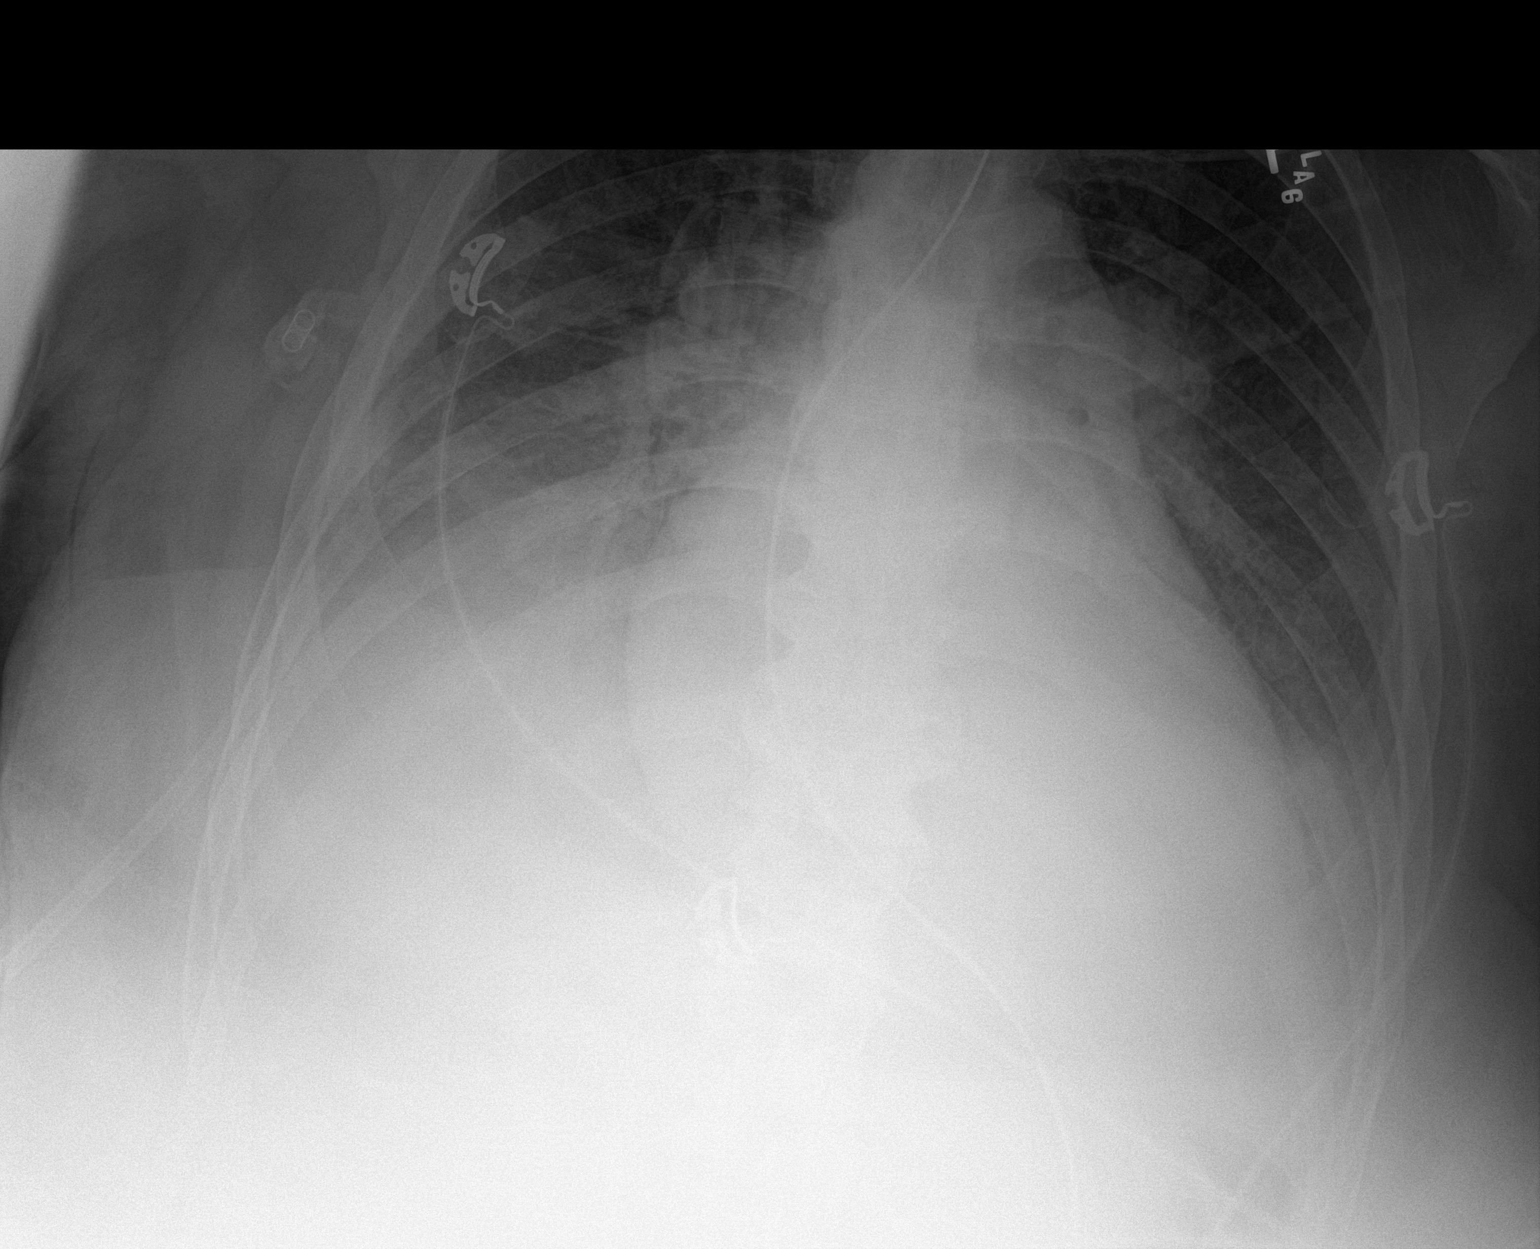
[im 2/2]
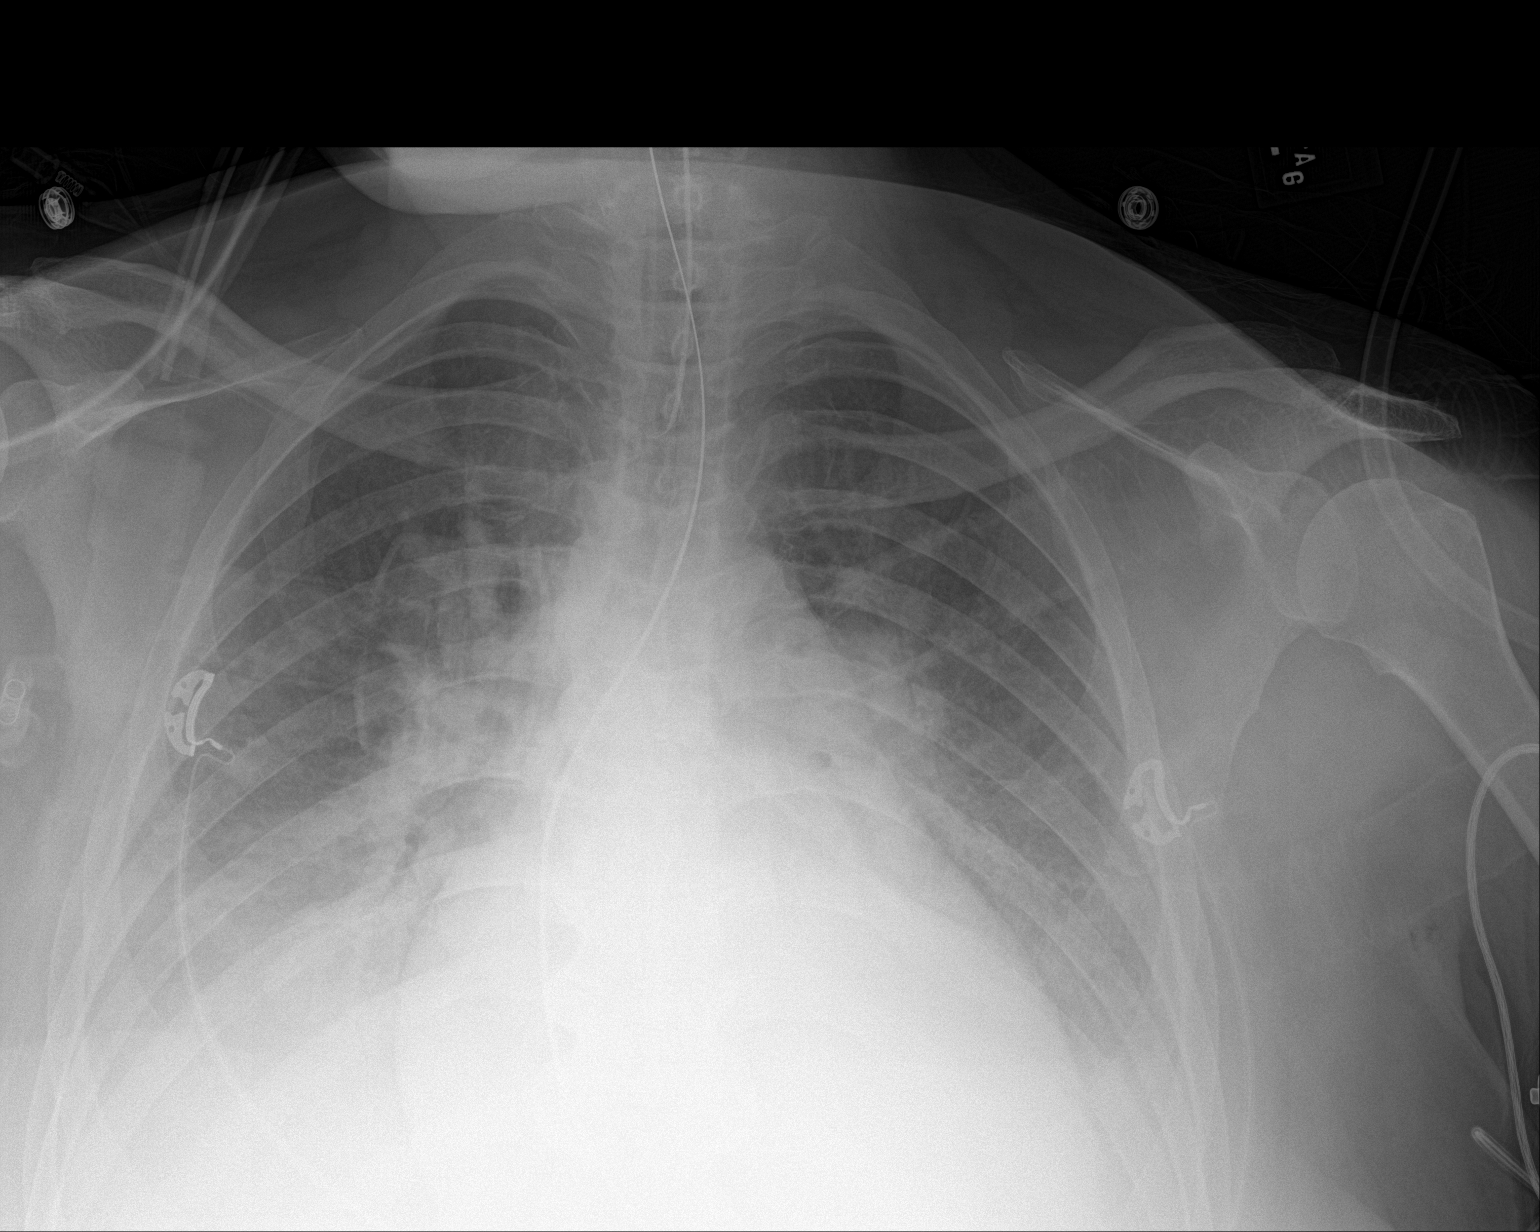

[2 of 2 positions shown; findings below may reference images not displayed]

FINDINGS: EKG leads project over the chest. Endotracheal tube approximately
5.5 cm from the carina, tip between clavicular heads. Previously
approximately 3 cm from the carina.

Gastric tube courses through in off the field of the radiograph.

Appearance of the chest is stable otherwise when compared to the
previous exam with graded opacity in the LEFT and RIGHT chest and
obscured LEFT and RIGHT hemidiaphragms with increased interstitial
markings.

On limited assessment there is no acute skeletal finding.
IMPRESSION: 1. Endotracheal tube approximately 5.5 cm from the carina, tip
between clavicular heads. This may have been retracted slightly
since previous imaging. Attention on follow-up, could consider
approximately 1 cm advancement as warranted for more optimal
placement.
2. Stable appearance of interstitial prominence, basilar airspace
disease and bilateral effusions.

These results will be called to the ordering clinician or
representative by the Radiologist Assistant, and communication
documented in the PACS or [REDACTED].

## 2023-01-19 IMAGING — DX DG CHEST 1V PORT
1 series · 1 of 1 positions shown · non-contrast
Comparison: June 14, 2021.

CLINICAL DATA: Pulmonary edema.

EXAM:
PORTABLE CHEST 1 VIEW

[chest ap]
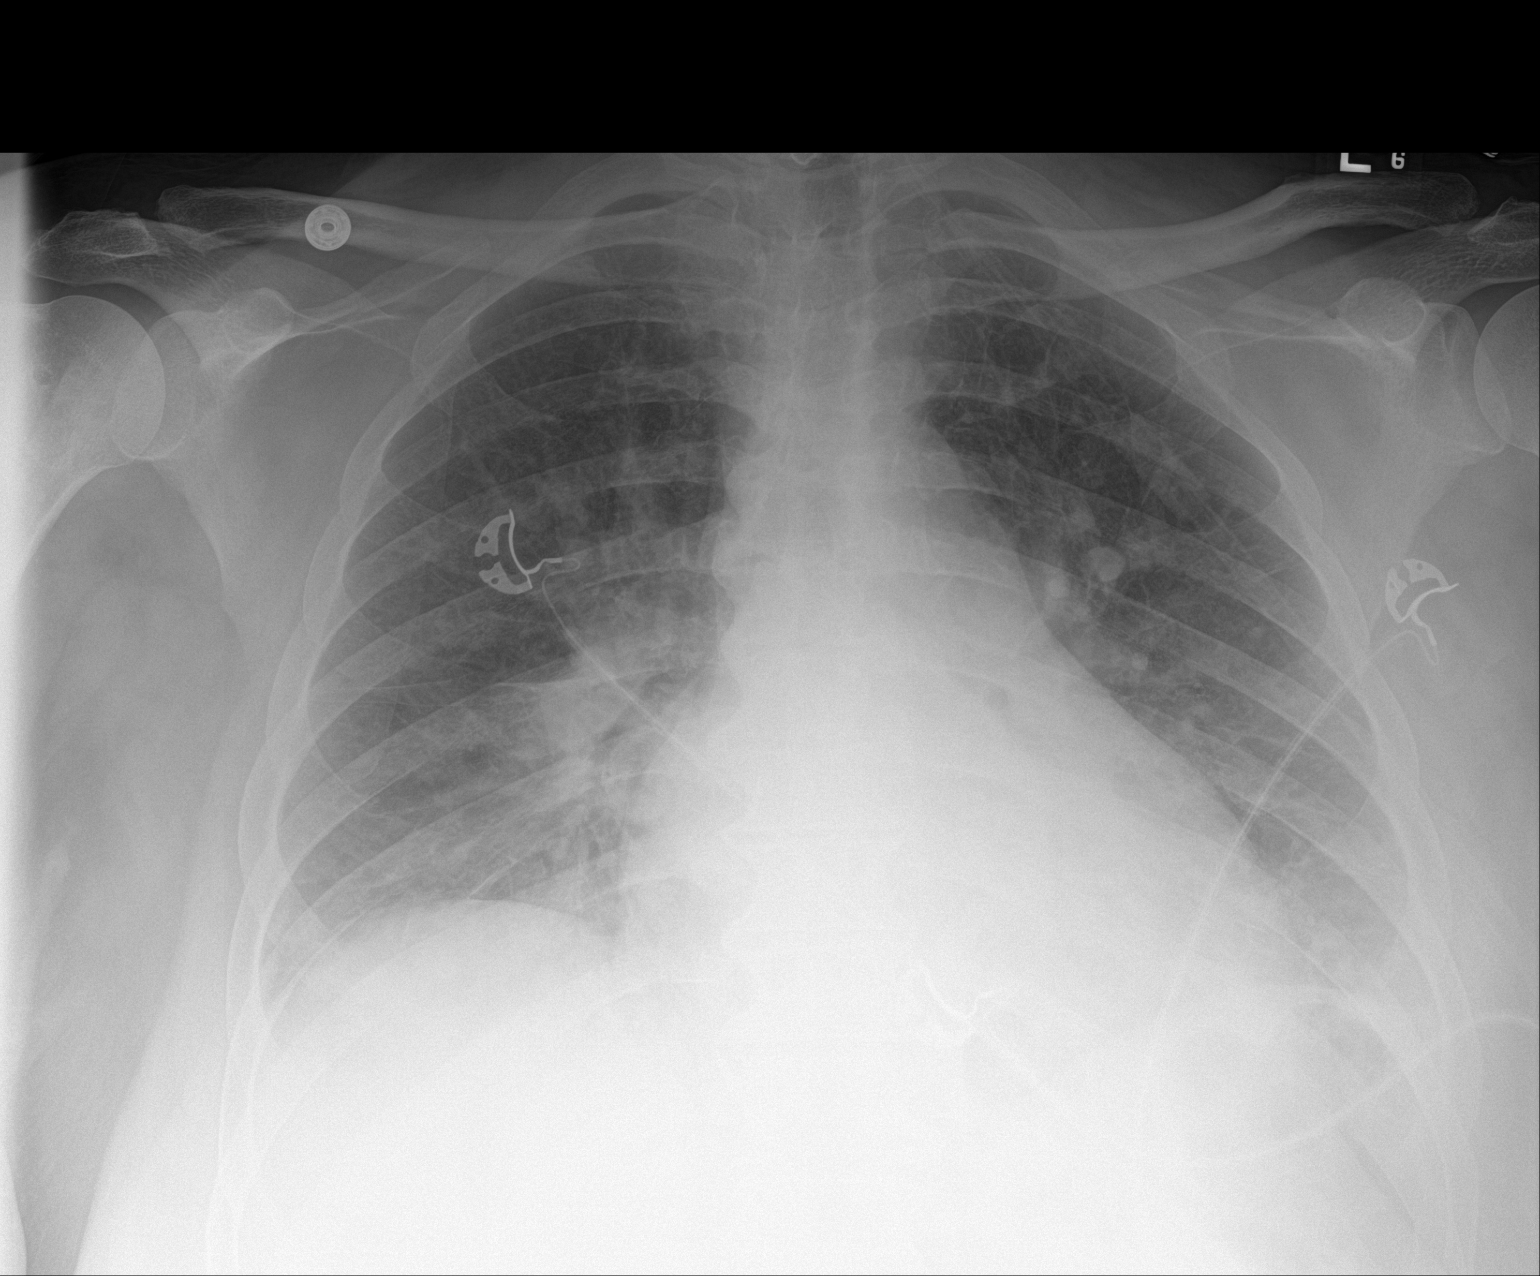

[1 of 1 positions shown; findings below may reference images not displayed]

FINDINGS: Stable cardiomediastinal silhouette. Bilateral perihilar and basilar
opacities are noted concerning for pulmonary edema or possibly
infiltrate. Bony thorax is unremarkable. Endotracheal and
nasogastric tubes have been removed.
IMPRESSION: Bilateral perihilar and basilar opacities are noted concerning for
pulmonary edema or possibly infiltrates.

## 2023-01-23 ENCOUNTER — Encounter (HOSPITAL_COMMUNITY): Payer: Self-pay | Admitting: Hematology

## 2023-01-29 ENCOUNTER — Ambulatory Visit: Payer: Medicare HMO | Attending: Cardiology

## 2023-01-30 ENCOUNTER — Encounter: Payer: Self-pay | Admitting: Emergency Medicine

## 2023-01-30 ENCOUNTER — Ambulatory Visit
Admission: EM | Admit: 2023-01-30 | Discharge: 2023-01-30 | Disposition: A | Discharge status: Home / Self Care | Payer: Medicare HMO | Attending: Nurse Practitioner | Admitting: Nurse Practitioner

## 2023-01-30 DIAGNOSIS — J101 Influenza due to other identified influenza virus with other respiratory manifestations: Secondary | ICD-10-CM | POA: Diagnosis not present

## 2023-01-30 LAB — POC COVID19/FLU A&B COMBO
Covid Antigen, POC: NEGATIVE
Influenza A Antigen, POC: POSITIVE — AB
Influenza B Antigen, POC: NEGATIVE

## 2023-01-30 MED ORDER — BENZONATATE 100 MG PO CAPS: 100.0000 mg | ORAL_CAPSULE | Freq: Three times a day (TID) | ORAL | 0 refills | Status: DC | PRN

## 2023-01-30 MED ORDER — OSELTAMIVIR PHOSPHATE 75 MG PO CAPS: 75.0000 mg | ORAL_CAPSULE | Freq: Two times a day (BID) | ORAL | 0 refills | Status: AC

## 2023-01-30 NOTE — ED Provider Notes (Signed)
 [facility]    CSN: [id number] Arrival date & time: 01/30/23  1548      History   Chief Complaint No chief complaint on file.   HPI [name] [name] is a 48 y.o. female.   Patient presents today with 3-day history of fever, Tmax 99.9 F, body aches and chills, congested cough, shortness of breath and chest pain when she coughs that improves after using rescue inhaler, stuffy nose, sore throat, headache, diarrhea, decreased appetite, and fatigue.  No runny nose, ear pain, abdominal pain, nausea/vomiting.  Daughter is now sick with similar symptoms as well.  Has been taking DayQuil and Alka-Seltzer cold and flu for symptoms with minimal improvement.    Past Medical History:  Diagnosis Date   Anemia    Blood transfusion without reported diagnosis    CAD (coronary artery disease)    Nonobstructive   Closed left ankle fracture    August 30 2012   Clotting disorder [facility])    DVT (deep venous thrombosis) (HCC)    L leg   Fibroid tumor    HFrEF (heart failure with reduced ejection fraction) (HCC)    Hidradenitis    Hypertension    Mitral regurgitation    Myocardial infarction [facility])    NICM (nonischemic cardiomyopathy) (HCC)    NSVT (nonsustained ventricular tachycardia) (HCC)    Obesity    OSA on CPAP    Pulmonary embolism (HCC) 04/2013   Sleep apnea     Patient Active Problem List   Diagnosis Date Noted   HFrEF (heart failure with reduced ejection fraction) (HCC) 07/08/2022   Acute exacerbation of congestive heart failure (HCC) 07/02/2022   Acute on chronic HFrEF (heart failure with reduced ejection fraction) (HCC) 02/13/2022   Breast swelling 01/24/2022   Cellulitis of breast 12/28/2021   Closed cervical spine fracture/C2 09/22/2021   Syncope and collapse 09/22/2021   Obesity (BMI 30-39.9) 09/22/2021   Pulmonary edema 06/14/2021   Dysphagia 06/14/2021   CAP (community acquired pneumonia) 06/05/2021   Other pancytopenia (HCC) 06/05/2021   Essential  hypertension 06/05/2021   Hypokalemia 06/05/2021   Microcytic anemia    Lumbar spondylosis 12/14/2018   Lumbar foraminal stenosis 12/14/2018   Lumbar degenerative disc disease 12/14/2018   Chronic anticoagulation 03/24/2018   STD exposure 03/11/2018   History of miscarriage 12/01/2017   Left leg DVT (HCC) 12/01/2017   Menorrhagia 12/01/2017   S/P IVC filter 12/01/2017   Chronic bilateral low back pain with bilateral sciatica 12/01/2017   Chronic pain of left ankle 12/01/2017   Chronic pain syndrome 12/01/2017   Long term current use of opiate analgesic 12/01/2017   Encounter for therapeutic drug monitoring 09/03/2017   History of pulmonary embolism 08/27/2017   Abdominal cramps 08/30/2015   Obesity, Class III, BMI 40-49.9 (morbid obesity) (HCC) 08/18/2014   Uterine leiomyoma 08/18/2014   Benign essential hypertension 07/21/2014   Severe Iron  deficiency anemia 02/05/2014   Back pain with left-sided radiculopathy 10/31/2013   Secondary pulmonary hypertension 07/06/2013   OSA on CPAP 07/06/2013   Pleuritic chest pain 04/27/2013   SOB (shortness of breath) 04/27/2013   Anemia 04/27/2013   Tobacco use disorder 04/27/2013   Saddle embolus of pulmonary artery with acute cor pulmonale (HCC) 04/27/2013   Bilateral pulmonary embolism (HCC) 04/27/2013    Past Surgical History:  Procedure Laterality Date   ABSCESS DRAINAGE Bilateral 01/10/2015   BREAST BIOPSY Right 02/01/2020   benign   COLONOSCOPY WITH PROPOFOL  N/A 06/08/2018   Procedure: COLONOSCOPY WITH  PROPOFOL ;  Surgeon: Shaaron Lamar HERO, MD;  Location: [facility];  Service: Endoscopy;  Laterality: N/A;  2:30pm   CYST REMOVAL TRUNK     IVC FILTER PLACEMENT ([facility] HX)     LOWER EXTREMITY VENOGRAPHY N/A 02/07/2021   Procedure: LOWER EXTREMITY VENOGRAPHY;  Surgeon: Lanis Fonda BRAVO, MD;  Location: [facility] INVASIVE CV LAB;  Service: Cardiovascular;  Laterality: N/A;   PERIPHERAL VASCULAR THROMBECTOMY N/A 02/07/2021   Procedure:  PERIPHERAL VASCULAR THROMBECTOMY;  Surgeon: Lanis Fonda BRAVO, MD;  Location: [facility] INVASIVE CV LAB;  Service: Cardiovascular;  Laterality: N/A;   RIGHT HEART CATH N/A 03/08/2022   Procedure: RIGHT HEART CATH;  Surgeon: Gardenia Led, DO;  Location: [facility] INVASIVE CV LAB;  Service: Cardiovascular;  Laterality: N/A;   RIGHT HEART CATH N/A 10/04/2022   Procedure: RIGHT HEART CATH;  Surgeon: Gardenia Led, DO;  Location: [facility] INVASIVE CV LAB;  Service: Cardiovascular;  Laterality: N/A;   RIGHT/LEFT HEART CATH AND CORONARY ANGIOGRAPHY N/A 06/19/2021   Procedure: RIGHT/LEFT HEART CATH AND CORONARY ANGIOGRAPHY;  Surgeon: Claudene Victory ORN, MD;  Location: [facility] INVASIVE CV LAB;  Service: Cardiovascular;  Laterality: N/A;   TEE WITHOUT CARDIOVERSION N/A 03/08/2022   Procedure: TRANSESOPHAGEAL ECHOCARDIOGRAM (TEE);  Surgeon: Gardenia Led, DO;  Location: [facility] ENDOSCOPY;  Service: Cardiovascular;  Laterality: N/A;    OB History     Gravida  4   Para  1   Term  1   Preterm      AB  3   Living  1      SAB  3   IAB      Ectopic      Multiple  0   Live Births  1            Home Medications    Prior to Admission medications   Medication Sig Start Date End Date Taking? Authorizing Provider  benzonatate  (TESSALON ) 100 MG capsule Take 1 capsule (100 mg total) by mouth 3 (three) times daily as needed for cough. Do not take with alcohol  or while operating or driving heavy machinery 8/0/74  Yes Chandra Harlene LABOR, NP  oseltamivir  (TAMIFLU ) 75 MG capsule Take 1 capsule (75 mg total) by mouth every 12 (twelve) hours for 5 days. 01/30/23 02/04/23 Yes Chandra Harlene LABOR, NP  albuterol  (VENTOLIN  HFA) 108 (90 Base) MCG/ACT inhaler Inhale 2 puffs into the lungs every 2 (two) hours as needed for wheezing or shortness of breath. 07/09/22   [name], MD  atorvastatin  (LIPITOR) 20 MG tablet Take 1 tablet (20 mg total) by mouth daily. 09/10/21   Alvan Dorn FALCON, MD  empagliflozin  (JARDIANCE ) 10 MG  TABS tablet Take 1 tablet (10 mg total) by mouth daily before breakfast. 01/30/22   [name], [name], DO  losartan  (COZAAR ) 25 MG tablet Take 1 tablet (25 mg total) by mouth daily. 10/04/22 09/29/23  [name], MD  metoprolol  succinate (TOPROL -XL) 25 MG 24 hr tablet Take 1 tablet (25 mg total) by mouth daily. Take with or immediately following a meal. 01/11/22 01/06/23  Antoinette Doe, MD  midodrine  (PROAMATINE ) 2.5 MG tablet Take 1 tablet (2.5 mg total) by mouth 2 (two) times daily as needed with a meal for Low BP 08/23/22   Miriam Norris, NP  potassium chloride  SA (KLOR-CON  M) 20 MEQ tablet Take 20 mEq by mouth 2 (two) times daily. 03/13/22   [provider]  spironolactone  (ALDACTONE ) 25 MG tablet Take 1 tablet (25 mg total) by mouth daily. 03/11/22  [name], [name], DO  torsemide  (DEMADEX ) 20 MG tablet Take 2 tablets (40 mg total) by mouth daily. 12/30/22   Alvan Dorn FALCON, MD  warfarin (COUMADIN ) 10 MG tablet TAKE 1/2 TO 1 TABLET BY MOUTH DAILY AS DIRECTED. MUST COME TO APPOINTMENT FOR REFILLS 12/30/22   Alvan Dorn FALCON, MD    Family History Family History  Problem Relation Age of Onset   Hypertension Mother    Diabetes Paternal Aunt    Diabetes Paternal Uncle    Other Father        blood clots   Cancer Maternal Grandmother    Heart attack Paternal Grandmother    Colon cancer Neg Hx     Social History Social History   Tobacco Use   Smoking status: Every Day    Types: Cigarettes    Passive exposure: Never   Smokeless tobacco: Never   Tobacco comments:    Smokes 2-3 cigarettes daily as of 02/13/22  Vaping Use   Vaping status: Never Used  Substance Use Topics   Alcohol  use: Yes    Alcohol /week: 2.0 standard drinks of alcohol     Types: 2 Cans of beer per week    Comment: twice a week   Drug use: No     Allergies   Sulfa  antibiotics   Review of Systems Review of Systems Per HPI  Physical Exam Triage Vital Signs ED Triage Vitals  Encounter  Vitals Group     BP 01/30/23 1623 118/75     Systolic BP Percentile --      Diastolic BP Percentile --      Pulse Rate 01/30/23 1623 72     Resp 01/30/23 1623 18     Temp 01/30/23 1623 98.3 F (36.8 C)     Temp Source 01/30/23 1623 Oral     SpO2 01/30/23 1623 96 %     Weight --      Height --      Head Circumference --      Peak Flow --      Pain Score 01/30/23 1610 10     Pain Loc --      Pain Education --      Exclude from Growth Chart --    No data found.  Updated Vital Signs BP 118/75 (BP Location: Right Arm)   Pulse 72   Temp 98.3 F (36.8 C) (Oral)   Resp 18   LMP 01/22/2023 (Exact Date)   SpO2 96%   Visual Acuity Right Eye Distance:   Left Eye Distance:   Bilateral Distance:    Right Eye Near:   Left Eye Near:    Bilateral Near:     Physical Exam Vitals and nursing note reviewed.  Constitutional:      General: She is not in acute distress.    Appearance: Normal appearance. She is ill-appearing. She is not toxic-appearing.  HENT:     Head: Normocephalic and atraumatic.     Right Ear: Tympanic membrane, ear canal and external ear normal.     Left Ear: Tympanic membrane, ear canal and external ear normal.     Nose: Congestion present. No rhinorrhea.     Mouth/Throat:     Mouth: Mucous membranes are moist.     Pharynx: Oropharynx is clear. No oropharyngeal exudate or posterior oropharyngeal erythema.  Eyes:     General: No scleral icterus.    Extraocular Movements: Extraocular movements intact.  Cardiovascular:     Rate and Rhythm: Normal rate and  regular rhythm.  Pulmonary:     Effort: Pulmonary effort is normal. No respiratory distress.     Breath sounds: Normal breath sounds. No wheezing, rhonchi or rales.  Musculoskeletal:     Cervical back: Normal range of motion and neck supple.  Lymphadenopathy:     Cervical: No cervical adenopathy.  Skin:    General: Skin is warm and dry.     Coloration: Skin is not jaundiced or pale.     Findings: No  erythema or rash.  Neurological:     Mental Status: She is alert and oriented to person, place, and time.  Psychiatric:        Behavior: Behavior is cooperative.      UC Treatments / Results  Labs (all labs ordered are listed, but only abnormal results are displayed) Labs Reviewed  POC COVID19/FLU A&B COMBO - Abnormal; Notable for the following components:      Result Value   Influenza A Antigen, POC Positive (*)    All other components within normal limits    EKG   Radiology No results found.  Procedures Procedures (including critical care time)  Medications Ordered in UC Medications - No data to display  Initial Impression / Assessment and Plan / UC Course  I have reviewed the triage vital signs and the nursing notes.  Pertinent labs & imaging results that were available during my care of the patient were reviewed by me and considered in my medical decision making (see chart for details).   Patient is well-appearing, normotensive, afebrile, not tachycardic, not tachypneic, oxygenating well on room air.    1. Influenza A Vitals and exam are reassuring Given comorbidities, will treat with Tamiflu  even though this is out of the window and we discussed with patient that it may not be as effective Other supportive care discussed including Coricidin products to replace DayQuil/Alka-Seltzer, cough suppressant medication ER and return precautions discussed with patient  The patient was given the opportunity to ask questions.  All questions answered to their satisfaction.  The patient is in agreement to this plan.    Final Clinical Impressions(s) / UC Diagnoses   Final diagnoses:  Influenza A     Discharge Instructions      You have influenza A.  Take the Tamiflu  as prescribed to treat it.  Symptoms should improve over the next few days.  If you develop chest pain or shortness of breath, go to the emergency room.  Some things that can make you feel better are: -  Increased rest - Increasing fluid with water/sugar free electrolytes - Acetaminophen  and ibuprofen  as needed for fever/pain - Salt water gargling, chloraseptic spray and throat lozenges - OTC guaifenesin  (Mucinex ) 600 mg twice daily - Saline sinus flushes or a neti pot - Humidifying the air -Tessalon  Perles every 8 hours as needed for dry cough      ED Prescriptions     Medication Sig Dispense Auth. Provider   oseltamivir  (TAMIFLU ) 75 MG capsule Take 1 capsule (75 mg total) by mouth every 12 (twelve) hours for 5 days. 10 capsule Chandra Raisin A, NP   benzonatate  (TESSALON ) 100 MG capsule Take 1 capsule (100 mg total) by mouth 3 (three) times daily as needed for cough. Do not take with alcohol  or while operating or driving heavy machinery 21 capsule Chandra Raisin LABOR, NP      PDMP not reviewed this encounter.   Chandra Raisin LABOR, NP 01/30/23 1721

## 2023-01-30 NOTE — ED Triage Notes (Signed)
 Chills, productive cough, headache, since Monday.  Has been taking dayquil and alka seltzer plus without relief.

## 2023-01-30 NOTE — Discharge Instructions (Signed)
 You have influenza A.  Take the Tamiflu  as prescribed to treat it.  Symptoms should improve over the next few days.  If you develop chest pain or shortness of breath, go to the emergency room.  Some things that can make you feel better are: - Increased rest - Increasing fluid with water/sugar free electrolytes - Acetaminophen  and ibuprofen  as needed for fever/pain - Salt water gargling, chloraseptic spray and throat lozenges - OTC guaifenesin  (Mucinex ) 600 mg twice daily - Saline sinus flushes or a neti pot - Humidifying the air -Tessalon  Perles every 8 hours as needed for dry cough

## 2023-04-04 ENCOUNTER — Encounter: Payer: Medicare HMO | Admitting: Family Medicine

## 2023-04-04 NOTE — Patient Instructions (Signed)

## 2023-04-04 NOTE — Progress Notes (Deleted)
   Established Patient Office Visit   Subjective  Patient ID: [name] [name], female    DOB: 11-17-1975  Age: 48 y.o. MRN: [id number]  No chief complaint on file.   She  has a past medical history of Anemia, Blood transfusion without reported diagnosis, CAD (coronary artery disease), Closed left ankle fracture, Clotting disorder (HCC), DVT (deep venous thrombosis) (HCC), Fibroid tumor, HFrEF (heart failure with reduced ejection fraction) (HCC), Hidradenitis, Hypertension, Mitral regurgitation, Myocardial infarction (HCC), NICM (nonischemic cardiomyopathy) (HCC), NSVT (nonsustained ventricular tachycardia) (HCC), Obesity, OSA on CPAP, Pulmonary embolism (HCC) (04/2013), and Sleep apnea.  HPI  ROS    Objective:     There were no vitals taken for this visit. {Vitals History (Optional):23777}  Physical Exam   No results found for any visits on 04/04/23.  The ASCVD Risk score (Arnett DK, et al., 2019) failed to calculate for the following reasons:   The valid total cholesterol range is 130 to 320 mg/dL    Assessment & Plan:  There are no diagnoses linked to this encounter.  No follow-ups on file.   Cruzita Lederer Newman Nip, FNP

## 2023-04-04 NOTE — Progress Notes (Signed)
 NO SHOW

## 2023-04-08 ENCOUNTER — Encounter: Payer: Self-pay | Admitting: Family Medicine

## 2023-04-08 ENCOUNTER — Telehealth: Payer: Self-pay

## 2023-04-08 NOTE — Telephone Encounter (Signed)
 Please dimiss

## 2023-04-08 NOTE — Telephone Encounter (Signed)
 Patient has four no shows overall and three no shows within a rolling 12 month period. Per [facility] policy, patient may be considered for dismissal. Seen one time for NP appt in August 2024.  Please advise.

## 2023-04-09 ENCOUNTER — Encounter: Payer: Self-pay | Admitting: Family Medicine

## 2023-04-28 ENCOUNTER — Other Ambulatory Visit: Payer: Self-pay | Admitting: Cardiology

## 2023-04-28 DIAGNOSIS — Z86718 Personal history of other venous thrombosis and embolism: Secondary | ICD-10-CM

## 2023-04-30 ENCOUNTER — Ambulatory Visit: Attending: Internal Medicine | Admitting: *Deleted

## 2023-04-30 ENCOUNTER — Other Ambulatory Visit: Payer: Self-pay | Admitting: Cardiology

## 2023-04-30 ENCOUNTER — Other Ambulatory Visit: Payer: Self-pay | Admitting: Nurse Practitioner

## 2023-04-30 DIAGNOSIS — Z86718 Personal history of other venous thrombosis and embolism: Secondary | ICD-10-CM | POA: Diagnosis not present

## 2023-04-30 DIAGNOSIS — Z86711 Personal history of pulmonary embolism: Secondary | ICD-10-CM

## 2023-04-30 DIAGNOSIS — Z5181 Encounter for therapeutic drug level monitoring: Secondary | ICD-10-CM | POA: Diagnosis not present

## 2023-04-30 LAB — POCT INR: INR: 1.7 — AB (ref 2.0–3.0)

## 2023-04-30 NOTE — Patient Instructions (Signed)
 Increase warfarin to 1 tablet daily except 1 1/2 tablets on Wednesday and Saturdays Recheck in 2 wks

## 2023-05-01 NOTE — Telephone Encounter (Signed)
 Requested by interface surescripts. Provider not at practice.  Requested Prescriptions  Refused Prescriptions Disp Refills   benzonatate (TESSALON) 100 MG capsule [Pharmacy Med Name: benzonatate 100 mg capsule] 21 capsule 0    Sig: Take 1 capsule (100 mg total) by mouth 3 (three) times daily as needed for cough. Do not take with alcohol or while operating or driving heavy machinery     There is no refill protocol information for this order

## 2023-05-06 ENCOUNTER — Emergency Department (HOSPITAL_COMMUNITY)

## 2023-05-06 ENCOUNTER — Other Ambulatory Visit: Payer: Self-pay

## 2023-05-06 ENCOUNTER — Encounter (HOSPITAL_COMMUNITY): Payer: Self-pay | Admitting: Internal Medicine

## 2023-05-06 ENCOUNTER — Inpatient Hospital Stay (HOSPITAL_COMMUNITY)
Admission: EM | Admit: 2023-05-06 | Discharge: 2023-05-16 | DRG: 286 | Disposition: A | Discharge status: Home Health | Attending: Cardiology | Admitting: Cardiology

## 2023-05-06 DIAGNOSIS — I4510 Unspecified right bundle-branch block: Secondary | ICD-10-CM | POA: Diagnosis present

## 2023-05-06 DIAGNOSIS — N179 Acute kidney failure, unspecified: Secondary | ICD-10-CM | POA: Diagnosis not present

## 2023-05-06 DIAGNOSIS — I2510 Atherosclerotic heart disease of native coronary artery without angina pectoris: Secondary | ICD-10-CM | POA: Diagnosis present

## 2023-05-06 DIAGNOSIS — E876 Hypokalemia: Secondary | ICD-10-CM | POA: Diagnosis present

## 2023-05-06 DIAGNOSIS — R0602 Shortness of breath: Secondary | ICD-10-CM | POA: Diagnosis not present

## 2023-05-06 DIAGNOSIS — I5082 Biventricular heart failure: Secondary | ICD-10-CM | POA: Diagnosis not present

## 2023-05-06 DIAGNOSIS — N170 Acute kidney failure with tubular necrosis: Secondary | ICD-10-CM | POA: Diagnosis not present

## 2023-05-06 DIAGNOSIS — Z833 Family history of diabetes mellitus: Secondary | ICD-10-CM

## 2023-05-06 DIAGNOSIS — Z7901 Long term (current) use of anticoagulants: Secondary | ICD-10-CM | POA: Diagnosis not present

## 2023-05-06 DIAGNOSIS — F17210 Nicotine dependence, cigarettes, uncomplicated: Secondary | ICD-10-CM | POA: Diagnosis not present

## 2023-05-06 DIAGNOSIS — I10 Essential (primary) hypertension: Secondary | ICD-10-CM | POA: Diagnosis present

## 2023-05-06 DIAGNOSIS — I5043 Acute on chronic combined systolic (congestive) and diastolic (congestive) heart failure: Principal | ICD-10-CM | POA: Diagnosis present

## 2023-05-06 DIAGNOSIS — I428 Other cardiomyopathies: Secondary | ICD-10-CM | POA: Diagnosis present

## 2023-05-06 DIAGNOSIS — I509 Heart failure, unspecified: Secondary | ICD-10-CM | POA: Diagnosis not present

## 2023-05-06 DIAGNOSIS — R0989 Other specified symptoms and signs involving the circulatory and respiratory systems: Secondary | ICD-10-CM | POA: Diagnosis not present

## 2023-05-06 DIAGNOSIS — Z6841 Body Mass Index (BMI) 40.0 and over, adult: Secondary | ICD-10-CM

## 2023-05-06 DIAGNOSIS — Z86711 Personal history of pulmonary embolism: Secondary | ICD-10-CM | POA: Diagnosis not present

## 2023-05-06 DIAGNOSIS — I340 Nonrheumatic mitral (valve) insufficiency: Secondary | ICD-10-CM | POA: Diagnosis not present

## 2023-05-06 DIAGNOSIS — Z7984 Long term (current) use of oral hypoglycemic drugs: Secondary | ICD-10-CM

## 2023-05-06 DIAGNOSIS — I5023 Acute on chronic systolic (congestive) heart failure: Secondary | ICD-10-CM | POA: Diagnosis present

## 2023-05-06 DIAGNOSIS — Z79899 Other long term (current) drug therapy: Secondary | ICD-10-CM

## 2023-05-06 DIAGNOSIS — Z604 Social exclusion and rejection: Secondary | ICD-10-CM | POA: Diagnosis present

## 2023-05-06 DIAGNOSIS — Z86718 Personal history of other venous thrombosis and embolism: Secondary | ICD-10-CM | POA: Diagnosis not present

## 2023-05-06 DIAGNOSIS — Z95828 Presence of other vascular implants and grafts: Secondary | ICD-10-CM | POA: Diagnosis not present

## 2023-05-06 DIAGNOSIS — D509 Iron deficiency anemia, unspecified: Secondary | ICD-10-CM | POA: Diagnosis present

## 2023-05-06 DIAGNOSIS — I110 Hypertensive heart disease with heart failure: Principal | ICD-10-CM | POA: Diagnosis present

## 2023-05-06 DIAGNOSIS — I493 Ventricular premature depolarization: Secondary | ICD-10-CM | POA: Diagnosis not present

## 2023-05-06 DIAGNOSIS — R32 Unspecified urinary incontinence: Secondary | ICD-10-CM | POA: Diagnosis not present

## 2023-05-06 DIAGNOSIS — E874 Mixed disorder of acid-base balance: Secondary | ICD-10-CM | POA: Diagnosis present

## 2023-05-06 DIAGNOSIS — J4489 Other specified chronic obstructive pulmonary disease: Secondary | ICD-10-CM | POA: Diagnosis present

## 2023-05-06 DIAGNOSIS — Z91148 Patient's other noncompliance with medication regimen for other reason: Secondary | ICD-10-CM

## 2023-05-06 DIAGNOSIS — G4733 Obstructive sleep apnea (adult) (pediatric): Secondary | ICD-10-CM | POA: Diagnosis present

## 2023-05-06 DIAGNOSIS — Z452 Encounter for adjustment and management of vascular access device: Secondary | ICD-10-CM | POA: Diagnosis not present

## 2023-05-06 DIAGNOSIS — Z1152 Encounter for screening for COVID-19: Secondary | ICD-10-CM

## 2023-05-06 DIAGNOSIS — Z8249 Family history of ischemic heart disease and other diseases of the circulatory system: Secondary | ICD-10-CM | POA: Diagnosis not present

## 2023-05-06 DIAGNOSIS — I2722 Pulmonary hypertension due to left heart disease: Secondary | ICD-10-CM | POA: Diagnosis not present

## 2023-05-06 DIAGNOSIS — I252 Old myocardial infarction: Secondary | ICD-10-CM | POA: Diagnosis not present

## 2023-05-06 DIAGNOSIS — I5084 End stage heart failure: Secondary | ICD-10-CM | POA: Diagnosis present

## 2023-05-06 DIAGNOSIS — I517 Cardiomegaly: Secondary | ICD-10-CM | POA: Diagnosis not present

## 2023-05-06 DIAGNOSIS — R059 Cough, unspecified: Secondary | ICD-10-CM | POA: Diagnosis not present

## 2023-05-06 DIAGNOSIS — R570 Cardiogenic shock: Secondary | ICD-10-CM | POA: Diagnosis not present

## 2023-05-06 DIAGNOSIS — R0781 Pleurodynia: Secondary | ICD-10-CM | POA: Diagnosis not present

## 2023-05-06 DIAGNOSIS — Z882 Allergy status to sulfonamides status: Secondary | ICD-10-CM

## 2023-05-06 DIAGNOSIS — L732 Hidradenitis suppurativa: Secondary | ICD-10-CM | POA: Diagnosis present

## 2023-05-06 LAB — CBC WITH DIFFERENTIAL/PLATELET
Abs Immature Granulocytes: 0.02 10*3/uL (ref 0.00–0.07)
Basophils Absolute: 0 10*3/uL (ref 0.0–0.1)
Basophils Relative: 1 %
Eosinophils Absolute: 0 10*3/uL (ref 0.0–0.5)
Eosinophils Relative: 0 %
HCT: 33.6 % — ABNORMAL LOW (ref 36.0–46.0)
Hemoglobin: 9.2 g/dL — ABNORMAL LOW (ref 12.0–15.0)
Immature Granulocytes: 1 %
Lymphocytes Relative: 11 %
Lymphs Abs: 0.4 10*3/uL — ABNORMAL LOW (ref 0.7–4.0)
MCH: 21 pg — ABNORMAL LOW (ref 26.0–34.0)
MCHC: 27.4 g/dL — ABNORMAL LOW (ref 30.0–36.0)
MCV: 76.7 fL — ABNORMAL LOW (ref 80.0–100.0)
Monocytes Absolute: 0.5 10*3/uL (ref 0.1–1.0)
Monocytes Relative: 13 %
Neutro Abs: 2.9 10*3/uL (ref 1.7–7.7)
Neutrophils Relative %: 74 %
Platelets: 186 10*3/uL (ref 150–400)
RBC: 4.38 MIL/uL (ref 3.87–5.11)
RDW: 23.6 % — ABNORMAL HIGH (ref 11.5–15.5)
Smear Review: ADEQUATE
WBC: 3.9 10*3/uL — ABNORMAL LOW (ref 4.0–10.5)
nRBC: 1.5 % — ABNORMAL HIGH (ref 0.0–0.2)

## 2023-05-06 LAB — BASIC METABOLIC PANEL WITH GFR
Anion gap: 12 (ref 5–15)
BUN: 26 mg/dL — ABNORMAL HIGH (ref 6–20)
CO2: 27 mmol/L (ref 22–32)
Calcium: 9.2 mg/dL (ref 8.9–10.3)
Chloride: 93 mmol/L — ABNORMAL LOW (ref 98–111)
Creatinine, Ser: 1.06 mg/dL — ABNORMAL HIGH (ref 0.44–1.00)
GFR, Estimated: >60 mL/min (ref >60)
Glucose, Bld: 101 mg/dL — ABNORMAL HIGH (ref 70–99)
Potassium: 3.6 mmol/L (ref 3.5–5.1)
Sodium: 132 mmol/L — ABNORMAL LOW (ref 135–145)

## 2023-05-06 LAB — PROTIME-INR
INR: 2 — ABNORMAL HIGH (ref 0.8–1.2)
Prothrombin Time: 23.1 s — ABNORMAL HIGH (ref 11.4–15.2)

## 2023-05-06 LAB — BRAIN NATRIURETIC PEPTIDE: B Natriuretic Peptide: 1671 pg/mL — ABNORMAL HIGH (ref 0.0–100.0)

## 2023-05-06 LAB — MAGNESIUM: Magnesium: 2.1 mg/dL (ref 1.7–2.4)

## 2023-05-06 LAB — RESP PANEL BY RT-PCR (RSV, FLU A&B, COVID)  RVPGX2
Influenza A by PCR: NEGATIVE
Influenza B by PCR: NEGATIVE
Resp Syncytial Virus by PCR: NEGATIVE
SARS Coronavirus 2 by RT PCR: NEGATIVE

## 2023-05-06 LAB — TROPONIN I (HIGH SENSITIVITY)
Troponin I (High Sensitivity): 44 ng/L — ABNORMAL HIGH (ref <18)
Troponin I (High Sensitivity): 48 ng/L — ABNORMAL HIGH (ref <18)

## 2023-05-06 MED ORDER — ONDANSETRON HCL 4 MG/2ML IJ SOLN
4.0000 mg | Freq: Four times a day (QID) | INTRAMUSCULAR | Status: DC | PRN
  Administered 2023-05-07 – 2023-05-16 (×5): 4 mg via INTRAVENOUS
  Filled 2023-05-06 (×4): qty 2

## 2023-05-06 MED ORDER — FUROSEMIDE 10 MG/ML IJ SOLN: 60.0000 mg | Freq: Two times a day (BID) | INTRAMUSCULAR | Status: DC

## 2023-05-06 MED ORDER — ONDANSETRON HCL 4 MG PO TABS: 4.0000 mg | ORAL_TABLET | Freq: Four times a day (QID) | ORAL | Status: DC | PRN

## 2023-05-06 MED ORDER — POLYETHYLENE GLYCOL 3350 17 G PO PACK: 17.0000 g | PACK | Freq: Every day | ORAL | Status: DC | PRN

## 2023-05-06 MED ORDER — ATORVASTATIN CALCIUM 10 MG PO TABS
20.0000 mg | ORAL_TABLET | Freq: Every day | ORAL | Status: DC
  Administered 2023-05-06 – 2023-05-16 (×11): 20 mg via ORAL
  Filled 2023-05-06: qty 1
  Filled 2023-05-06 (×9): qty 2
  Filled 2023-05-06: qty 1

## 2023-05-06 MED ORDER — WARFARIN SODIUM 7.5 MG PO TABS
15.0000 mg | ORAL_TABLET | Freq: Once | ORAL | Status: AC
  Administered 2023-05-06: 15 mg via ORAL
  Filled 2023-05-06: qty 2

## 2023-05-06 MED ORDER — FUROSEMIDE 10 MG/ML IJ SOLN
40.0000 mg | Freq: Two times a day (BID) | INTRAMUSCULAR | Status: DC
  Administered 2023-05-07: 40 mg via INTRAVENOUS
  Filled 2023-05-06: qty 4

## 2023-05-06 MED ORDER — GUAIFENESIN-DM 100-10 MG/5ML PO SYRP
5.0000 mL | ORAL_SOLUTION | ORAL | Status: DC | PRN
  Administered 2023-05-06 – 2023-05-16 (×16): 5 mL via ORAL
  Filled 2023-05-06 (×16): qty 5

## 2023-05-06 MED ORDER — LOSARTAN POTASSIUM 25 MG PO TABS
25.0000 mg | ORAL_TABLET | Freq: Every day | ORAL | Status: DC
  Administered 2023-05-07: 25 mg via ORAL
  Filled 2023-05-06: qty 1

## 2023-05-06 MED ORDER — ACETAMINOPHEN 325 MG PO TABS
650.0000 mg | ORAL_TABLET | Freq: Four times a day (QID) | ORAL | Status: DC | PRN
  Administered 2023-05-07 – 2023-05-15 (×10): 650 mg via ORAL
  Filled 2023-05-06 (×11): qty 2

## 2023-05-06 MED ORDER — WARFARIN - PHARMACIST DOSING INPATIENT: Freq: Every day | Status: DC

## 2023-05-06 MED ORDER — ACETAMINOPHEN 650 MG RE SUPP: 650.0000 mg | Freq: Four times a day (QID) | RECTAL | Status: DC | PRN

## 2023-05-06 MED ORDER — ACETAMINOPHEN 500 MG PO TABS
1000.0000 mg | ORAL_TABLET | Freq: Once | ORAL | Status: AC
  Administered 2023-05-06: 1000 mg via ORAL
  Filled 2023-05-06: qty 2

## 2023-05-06 MED ORDER — FUROSEMIDE 10 MG/ML IJ SOLN
80.0000 mg | Freq: Once | INTRAMUSCULAR | Status: AC
  Administered 2023-05-06: 80 mg via INTRAVENOUS
  Filled 2023-05-06: qty 8

## 2023-05-06 MED ORDER — POTASSIUM CHLORIDE CRYS ER 20 MEQ PO TBCR
40.0000 meq | EXTENDED_RELEASE_TABLET | Freq: Once | ORAL | Status: AC
  Administered 2023-05-06: 40 meq via ORAL
  Filled 2023-05-06: qty 2

## 2023-05-06 MED ORDER — METOPROLOL SUCCINATE ER 25 MG PO TB24
25.0000 mg | ORAL_TABLET | Freq: Every day | ORAL | Status: DC
  Administered 2023-05-06 – 2023-05-07 (×2): 25 mg via ORAL
  Filled 2023-05-06 (×2): qty 1

## 2023-05-06 NOTE — H&P (Addendum)
 History and Physical    [name] [name] YQM:[id number] DOB: 12-21-1975 DOA: 05/06/2023  PCP: Rica Records, FNP   Patient coming from: Home  I have personally briefly reviewed patient's old medical records in [facility]  Chief Complaint: Difficulty breathing, Leg swelling  HPI: [name] [name] is a 48 y.o. female with medical history significant for systolic CHF, hypertension, DVT and PE on chronic anticoagulation with warfarin, IVC status, OSA on CPAP. Patient presented to the ED with complaints of increasing difficulty breathing, and swelling to her bilateral lower extremities up to her thighs that started 2 days ago.  She reports her baseline weight is about 274 pounds, she is weighing 299 here in the ED.  She has had to sleep sitting up. She reports about a week ago, her Lasix 80 mg twice daily was switched over to torsemide 40 mg once daily-she is unable to tell exactly why her medication was changed.  She reports compliance, but since then she noted swelling.   She reports chest tightness due to difficulty breathing without chest pain.  ED Course: Temperature 97.8.  Heart rate 94-109.  Respiratory rate 20-31, blood pressure 116 to 150s systolic.  O2 sats of my evaluation 94% on room air. BNP elevated at 1671.  INR 2. Chest x-ray shows pulmonary vascular congestion, possible mild interstitial edema. IV Lasix 80 mg x 1 given, hospitalist to admit for decompensated CHF.  Review of Systems: As per HPI all other systems reviewed and negative.  Past Medical History:  Diagnosis Date   Anemia    Blood transfusion without reported diagnosis    CAD (coronary artery disease)    Nonobstructive   Closed left ankle fracture    August 30 2012   Clotting disorder [facility])    DVT (deep venous thrombosis) (HCC)    L leg   Fibroid tumor    HFrEF (heart failure with reduced ejection fraction) (HCC)    Hidradenitis    Hypertension    Mitral regurgitation    Myocardial  infarction [facility])    NICM (nonischemic cardiomyopathy) (HCC)    NSVT (nonsustained ventricular tachycardia) (HCC)    Obesity    OSA on CPAP    Pulmonary embolism (HCC) 04/2013   Sleep apnea     Past Surgical History:  Procedure Laterality Date   ABSCESS DRAINAGE Bilateral 01/10/2015   BREAST BIOPSY Right 02/01/2020   benign   COLONOSCOPY WITH PROPOFOL N/A 06/08/2018   Procedure: COLONOSCOPY WITH PROPOFOL;  Surgeon: Corbin Ade, MD;  Location: [facility];  Service: Endoscopy;  Laterality: N/A;  2:30pm   CYST REMOVAL TRUNK     IVC FILTER PLACEMENT ([facility] HX)     LOWER EXTREMITY VENOGRAPHY N/A 02/07/2021   Procedure: LOWER EXTREMITY VENOGRAPHY;  Surgeon: Victorino Sparrow, MD;  Location: [facility] INVASIVE CV LAB;  Service: Cardiovascular;  Laterality: N/A;   PERIPHERAL VASCULAR THROMBECTOMY N/A 02/07/2021   Procedure: PERIPHERAL VASCULAR THROMBECTOMY;  Surgeon: Victorino Sparrow, MD;  Location: [facility] INVASIVE CV LAB;  Service: Cardiovascular;  Laterality: N/A;   RIGHT HEART CATH N/A 03/08/2022   Procedure: RIGHT HEART CATH;  Surgeon: Dorthula Nettles, DO;  Location: [facility] INVASIVE CV LAB;  Service: Cardiovascular;  Laterality: N/A;   RIGHT HEART CATH N/A 10/04/2022   Procedure: RIGHT HEART CATH;  Surgeon: Dorthula Nettles, DO;  Location: [facility] INVASIVE CV LAB;  Service: Cardiovascular;  Laterality: N/A;   RIGHT/LEFT HEART CATH AND CORONARY ANGIOGRAPHY N/A 06/19/2021   Procedure: RIGHT/LEFT HEART CATH  AND CORONARY ANGIOGRAPHY;  Surgeon: Lyn Records, MD;  Location: [facility] INVASIVE CV LAB;  Service: Cardiovascular;  Laterality: N/A;   TEE WITHOUT CARDIOVERSION N/A 03/08/2022   Procedure: TRANSESOPHAGEAL ECHOCARDIOGRAM (TEE);  Surgeon: Dorthula Nettles, DO;  Location: [facility] ENDOSCOPY;  Service: Cardiovascular;  Laterality: N/A;     reports that she has been smoking cigarettes. She has never been exposed to tobacco smoke. She has never used smokeless tobacco. She reports current alcohol use of about 2.0  standard drinks of alcohol per week. She reports that she does not use drugs.  Allergies  Allergen Reactions   Sulfa Antibiotics Itching, Swelling and Rash    Lip swelling    Family History  Problem Relation Age of Onset   Hypertension Mother    Diabetes Paternal Aunt    Diabetes Paternal Uncle    Other Father        blood clots   Cancer Maternal Grandmother    Heart attack Paternal Grandmother    Colon cancer Neg Hx     Prior to Admission medications   Medication Sig Start Date End Date Taking? Authorizing Provider  albuterol (VENTOLIN HFA) 108 (90 Base) MCG/ACT inhaler Inhale 2 puffs into the lungs every 2 (two) hours as needed for wheezing or shortness of breath. 07/09/22  Yes Jeffren Dombek, Courage, MD  atorvastatin (LIPITOR) 20 MG tablet TAKE ONE TABLET BY MOUTH ONCE DAILY. 04/30/23  Yes Branch, Dorothe Pea, MD  benzonatate (TESSALON) 100 MG capsule Take 1 capsule (100 mg total) by mouth 3 (three) times daily as needed for cough. Do not take with alcohol or while operating or driving heavy machinery 01/27/08  Yes Valentino Nose, NP  empagliflozin (JARDIANCE) 10 MG TABS tablet Take 1 tablet (10 mg total) by mouth daily before breakfast. 01/30/22  Yes [name], [name], DO  losartan (COZAAR) 25 MG tablet Take 1 tablet (25 mg total) by mouth daily. 10/04/22 09/29/23 Yes Romie Minus, MD  metoprolol succinate (TOPROL-XL) 25 MG 24 hr tablet Take 1 tablet (25 mg total) by mouth daily. Take with or immediately following a meal. 01/11/22 05/06/23 Yes Memon, Durward Mallard, MD  midodrine (PROAMATINE) 2.5 MG tablet Take 1 tablet (2.5 mg total) by mouth 2 (two) times daily as needed with a meal for Low BP 08/23/22  Yes Sharlene Dory, NP  potassium chloride SA (KLOR-CON M) 20 MEQ tablet Take 20 mEq by mouth 2 (two) times daily. 03/13/22  Yes [provider]  spironolactone (ALDACTONE) 25 MG tablet Take 1 tablet (25 mg total) by mouth daily. 03/11/22  Yes [name], [name], DO  torsemide (DEMADEX)  20 MG tablet Take 2 tablets (40 mg total) by mouth daily. 12/30/22  Yes BranchDorothe Pea, MD  warfarin (COUMADIN) 10 MG tablet TAKE 1/2 TO 1 TABLET BY MOUTH DAILY AS DIRECTED. MUST COME TO APPOINTMENT FOR REFILLS Patient taking differently: Take 10 mg by mouth one time only at 4 PM. TAKE 1/2 TO 1 TABLET BY MOUTH DAILY AS DIRECTED. MUST COME TO APPOINTMENT FOR REFILLS Wednesdays and Saturdays 1.5 tabs, rest of the week is 1 tab 04/29/23  Yes Antoine Poche, MD    Physical Exam: Vitals:   05/06/23 1545 05/06/23 1630 05/06/23 1700 05/06/23 1730  BP: (!) 121/97 (!) 132/91 (!) 128/44 (!) 137/95  Pulse:  (!) 108  (!) 109  Resp: (!) 23 (!) 31 (!) 29 (!) 25  Temp:      TempSrc:      SpO2:  (!) 87%  (!) 86%  Weight:      Height:        Constitutional: NAD, calm, comfortable Vitals:   05/06/23 1545 05/06/23 1630 05/06/23 1700 05/06/23 1730  BP: (!) 121/97 (!) 132/91 (!) 128/44 (!) 137/95  Pulse:  (!) 108  (!) 109  Resp: (!) 23 (!) 31 (!) 29 (!) 25  Temp:      TempSrc:      SpO2:  (!) 87%  (!) 86%  Weight:      Height:       Eyes: PERRL, lids and conjunctivae normal ENMT: Mucous membranes are moist.   Neck: normal, supple, no masses, no thyromegaly Respiratory: Crackles bilateral bases, no wheezing,  Normal respiratory effort. No accessory muscle use.  Cardiovascular: Regular rate and rhythm, no murmurs / rubs / gallops.  3+ pitting bilateral lower extremity edema to knees.  Extremities warm. Abdomen: no tenderness, no masses palpated. No hepatosplenomegaly. Bowel sounds positive.  Musculoskeletal: no clubbing / cyanosis. No joint deformity upper and lower extremities.  Skin: no rashes, lesions, ulcers. No induration Neurologic: No facial asymmetry, moving extremities spontaneously, speech fluent. Psychiatric: Normal judgment and insight. Alert and oriented x 3. Normal mood.   Labs on Admission: I have personally reviewed following labs and imaging studies  CBC: Recent Labs  Lab  05/06/23 1329  WBC 3.9*  NEUTROABS 2.9  HGB 9.2*  HCT 33.6*  MCV 76.7*  PLT 186   Basic Metabolic Panel: Recent Labs  Lab 05/06/23 1329 05/06/23 1603  NA 132*  --   K 3.6  --   CL 93*  --   CO2 27  --   GLUCOSE 101*  --   BUN 26*  --   CREATININE 1.06*  --   CALCIUM 9.2  --   MG  --  2.1   Coagulation Profile: Recent Labs  Lab 04/30/23 1253 05/06/23 1603  INR 1.7* 2.0*   Urine analysis:    Component Value Date/Time   COLORURINE YELLOW 09/22/2021 1506   APPEARANCEUR HAZY (A) 09/22/2021 1506   APPEARANCEUR Cloudy (A) 07/21/2014 1326   LABSPEC 1.008 09/22/2021 1506   PHURINE 6.0 09/22/2021 1506   GLUCOSEU NEGATIVE 09/22/2021 1506   HGBUR NEGATIVE 09/22/2021 1506   BILIRUBINUR NEGATIVE 09/22/2021 1506   BILIRUBINUR Negative 07/21/2014 1326   KETONESUR NEGATIVE 09/22/2021 1506   PROTEINUR NEGATIVE 09/22/2021 1506   UROBILINOGEN 0.2 06/30/2014 0910   NITRITE NEGATIVE 09/22/2021 1506   LEUKOCYTESUR TRACE (A) 09/22/2021 1506    Radiological Exams on Admission: DG Chest 2 View Result Date: 05/06/2023 CLINICAL DATA:  Shortness of breath. EXAM: CHEST - 2 VIEW COMPARISON:  Chest radiograph dated 07/07/2022. FINDINGS: Cardiomegaly. Pulmonary vascular congestion with possible mild interstitial edema. No focal consolidation, pleural effusion, or pneumothorax. No acute osseous abnormality. IMPRESSION: Cardiomegaly with pulmonary vascular congestion and possible mild interstitial edema. Electronically Signed   By: Hart Robinsons M.D.   On: 05/06/2023 15:34    EKG: Independently reviewed.  Sinus tachycardia rate 106, multiform PVCs.  QTc 488.  Assessment/Plan Principal Problem:   Acute on chronic combined systolic (congestive) and diastolic (congestive) heart failure (HCC) Active Problems:   OSA on CPAP   Benign essential hypertension   History of pulmonary embolism   Chronic anticoagulation  Assessment and Plan: * Acute on chronic combined systolic (congestive) and  diastolic (congestive) heart failure (HCC) 3+ bilateral pitting lower extremity edema, chest x-ray with pulmonary vascular congestion, possible mild interstitial edema.  BNP elevated at 1671.  Reports baseline weight of ~  274, weight 299 today.  Home diuretic recently changed from Lasix 80 mg twice daily to torsemide 40 mg daily.  Last echo 06/2022 EF 25 to 30%, G3DD.  On my evaluation sats 94% on room air. -IV Lasix 80 mg x 1 given, continue 40 twice daily -Strict input output, daily weight, daily BMP -Obtain updated echocardiogram -Resume spironolactone, losartan, metoprolol - Hold Jardiance while hospitalized -Not on Entresto, Per cardiology office notes 12/30/22, patient soft BPs limited meds during admission.  History of pulmonary embolism History of saddle PE and DVT, has IVC filter not on chronic anticoagulation with warfarin.  INR 2. - Warfarin per pharmacy.  Benign essential hypertension Stable. -Resume losartan, metoprolol - PRN midodrine also on med list, held for now with stable blood pressure  DVT prophylaxis: Warfarin Code Status: Full  Family Communication: None at bedside Disposition Plan: > 2 days Consults called: None  Admission status: Inpt Tele I certify that at the point of admission it is my clinical judgment that the patient will require inpatient hospital care spanning beyond 2 midnights from the point of admission due to high intensity of service, high risk for further deterioration and high frequency of surveillance required.   Author: Pati Bonine, MD 05/06/2023 7:11 PM  For on call review [email or web address].

## 2023-05-06 NOTE — Assessment & Plan Note (Signed)
 Stable. -Resume losartan, metoprolol - PRN midodrine also on med list, held for now with stable blood pressure

## 2023-05-06 NOTE — Consult Note (Signed)
 PHARMACY - ANTICOAGULATION CONSULT NOTE  Pharmacy Consult for Warfarin Indication: history of pulmonary embolus and DVT  Allergies  Allergen Reactions   Sulfa Antibiotics Itching, Swelling and Rash    Lip swelling    Patient Measurements: Height: 5\' 10"  (177.8 cm) Weight: 135.6 kg (299 lb) IBW/kg (Calculated) : 68.5 HEPARIN DW (KG): 100.6  Vital Signs: Temp: 97.8 F (36.6 C) (04/15 1225) Temp Source: Oral (04/15 1225) BP: 137/95 (04/15 1730) Pulse Rate: 109 (04/15 1730)  Labs: Recent Labs    05/06/23 1329 05/06/23 1603  HGB 9.2*  --   HCT 33.6*  --   PLT 186  --   LABPROT  --  23.1*  INR  --  2.0*  CREATININE 1.06*  --   TROPONINIHS 44* 48*    Estimated Creatinine Clearance: 99.8 mL/min (A) (by C-G formula based on SCr of 1.06 mg/dL (H)).   Medical History: Past Medical History:  Diagnosis Date   Anemia    Blood transfusion without reported diagnosis    CAD (coronary artery disease)    Nonobstructive   Closed left ankle fracture    August 30 2012   Clotting disorder [facility])    DVT (deep venous thrombosis) (HCC)    L leg   Fibroid tumor    HFrEF (heart failure with reduced ejection fraction) (HCC)    Hidradenitis    Hypertension    Mitral regurgitation    Myocardial infarction (HCC)    NICM (nonischemic cardiomyopathy) (HCC)    NSVT (nonsustained ventricular tachycardia) (HCC)    Obesity    OSA on CPAP    Pulmonary embolism (HCC) 04/2013   Sleep apnea     Medications:  Warfarin 15mg  on Wednesdays and Saturdays, 10mg  all other days --Last dose taken: 10mg  on 4/14@1915   Assessment: 48 y.o. female with medical history significant for systolic CHF, hypertension, DVT and PE on chronic anticoagulation with warfarin, IVC status, OSA on CPAP presented to the ED with complaints of increasing difficulty breathing, and swelling to her bilateral lower extremities up to her thighs that started 2 days ago. She reports her baseline weight is about 274 pounds, she  is weighing 299 here in the ED. She has had to sleep sitting up. Pharmacy has been consulted to manage warfarin while admitted.  Of note: patient's home warfarin regimen recently changed(per anticoagulation clinic note on 04/30/2023) from 10mg  daily to 15mg  on Wednesday and Saturdays, and 10mg  all other days.  Goal of Therapy:  INR 2-3 Monitor platelets by anticoagulation protocol: Yes  Date:  INR: 4/15 2.0   Plan:  INR on the lower end of goal Will give Warfarin 15mg  this evening(1.5x home dose) INR/CBC daily  [name] [name] 05/06/2023,6:42 PM

## 2023-05-06 NOTE — Assessment & Plan Note (Addendum)
 3+ bilateral pitting lower extremity edema, chest x-ray with pulmonary vascular congestion, possible mild interstitial edema.  BNP elevated at 1671.  Reports baseline weight of ~274, weight 299 today.  Home diuretic recently changed from Lasix 80 mg twice daily to torsemide 40 mg daily.  Last echo 06/2022 EF 25 to 30%, G3DD.  On my evaluation sats 94% on room air. -IV Lasix 80 mg x 1 given, continue 40 twice daily -Strict input output, daily weight, daily BMP -Obtain updated echocardiogram -Resume spironolactone, losartan, metoprolol - Hold Jardiance while hospitalized -Not on Entresto, Per cardiology office notes 12/30/22, patient soft BPs limited meds during admission.

## 2023-05-06 NOTE — ED Triage Notes (Signed)
 Pt presents with SOB and fluid retention x 2 days.

## 2023-05-06 NOTE — Progress Notes (Signed)
   05/06/23 2305  BiPAP/CPAP/SIPAP  $ Non-Invasive Ventilator  Non-Invasive Vent Set Up  $ Face Mask Medium Yes  BiPAP/CPAP/SIPAP Pt Type Adult  BiPAP/CPAP/SIPAP DREAMSTATIOND  Mask Type Nasal mask  Dentures removed? Not applicable  Mask Size Medium  Respiratory Rate 20 breaths/min  EPAP 5 cmH2O  Flow Rate 2 lpm  Patient Home Machine No  Patient Home Mask No  Patient Home Tubing No  Auto Titrate No  BiPAP/CPAP /SiPAP Vitals  Pulse Rate 93  Resp 20  SpO2 99 %  Bilateral Breath Sounds Clear;Diminished  MEWS Score/Color  MEWS Score 0  MEWS Score Color Marrie Sizer

## 2023-05-06 NOTE — ED Provider Notes (Signed)
 [facility] Provider Note   CSN: [id number] Arrival date & time: 05/06/23  1214     History {Add pertinent medical, surgical, social history, OB history to HPI:1} Chief Complaint  Patient presents with   Shortness of Breath    [name] [name] is a 48 y.o. female.  She has a history of PE on warfarin, CHF on diuretics.  She tells me her doctor changed her from furosemide 80 mg twice daily to torsemide 40 mg about 2 weeks ago.  For the last week or so she has been more short of breath and increased swelling in her legs.  She is having some tightness in her chest.  She has also had headaches nausea vomiting.  It was generally weak.  No fevers or chills.  She is short of breath at rest but increased with any type of exertion.  She has not checked her weights.  The history is provided by the patient.  Shortness of Breath Severity:  Moderate Onset quality:  Gradual Duration:  1 week Timing:  Constant Progression:  Worsening Chronicity:  New Relieved by:  Nothing Worsened by:  Activity Ineffective treatments:  Diuretics Associated symptoms: chest pain, cough, headaches and vomiting   Associated symptoms: no abdominal pain, no fever, no hemoptysis, no sputum production and no wheezing   Risk factors: hx of PE/DVT and tobacco use        Home Medications Prior to Admission medications   Medication Sig Start Date End Date Taking? Authorizing Provider  albuterol (VENTOLIN HFA) 108 (90 Base) MCG/ACT inhaler Inhale 2 puffs into the lungs every 2 (two) hours as needed for wheezing or shortness of breath. 07/09/22   Shon Hale, MD  atorvastatin (LIPITOR) 20 MG tablet TAKE ONE TABLET BY MOUTH ONCE DAILY. 04/30/23   Antoine Poche, MD  benzonatate (TESSALON) 100 MG capsule Take 1 capsule (100 mg total) by mouth 3 (three) times daily as needed for cough. Do not take with alcohol or while operating or driving heavy machinery 07/25/23   Valentino Nose, NP  empagliflozin (JARDIANCE) 10 MG TABS tablet Take 1 tablet (10 mg total) by mouth daily before breakfast. 01/30/22   [name], [name], DO  losartan (COZAAR) 25 MG tablet Take 1 tablet (25 mg total) by mouth daily. 10/04/22 09/29/23  Romie Minus, MD  metoprolol succinate (TOPROL-XL) 25 MG 24 hr tablet Take 1 tablet (25 mg total) by mouth daily. Take with or immediately following a meal. 01/11/22 01/06/23  Erick Blinks, MD  midodrine (PROAMATINE) 2.5 MG tablet Take 1 tablet (2.5 mg total) by mouth 2 (two) times daily as needed with a meal for Low BP 08/23/22   Sharlene Dory, NP  potassium chloride SA (KLOR-CON M) 20 MEQ tablet Take 20 mEq by mouth 2 (two) times daily. 03/13/22   [provider]  spironolactone (ALDACTONE) 25 MG tablet Take 1 tablet (25 mg total) by mouth daily. 03/11/22   [name], [name], DO  torsemide (DEMADEX) 20 MG tablet Take 2 tablets (40 mg total) by mouth daily. 12/30/22   Antoine Poche, MD  warfarin (COUMADIN) 10 MG tablet TAKE 1/2 TO 1 TABLET BY MOUTH DAILY AS DIRECTED. MUST COME TO APPOINTMENT FOR REFILLS 04/29/23   Antoine Poche, MD      Allergies    Sulfa antibiotics    Review of Systems   Review of Systems  Constitutional:  Negative for fever.  Eyes:  Negative for visual disturbance.  Respiratory:  Positive for cough and shortness of breath. Negative for hemoptysis, sputum production and wheezing.   Cardiovascular:  Positive for chest pain and leg swelling.  Gastrointestinal:  Positive for nausea and vomiting. Negative for abdominal pain.  Neurological:  Positive for headaches.    Physical Exam Updated Vital Signs BP (!) 130/96   Pulse (!) 103   Temp 97.8 F (36.6 C) (Oral)   Resp (!) 23   Ht 5\' 10"  (1.778 m)   Wt 135.6 kg   LMP 03/25/2023   SpO2 100%   BMI 42.90 kg/m  Physical Exam Vitals and nursing note reviewed.  Constitutional:      General: She is not in acute distress.    Appearance: She is  well-developed.  HENT:     Head: Normocephalic and atraumatic.  Eyes:     Conjunctiva/sclera: Conjunctivae normal.  Cardiovascular:     Rate and Rhythm: Regular rhythm. Tachycardia present.     Heart sounds: No murmur heard. Pulmonary:     Effort: Tachypnea and accessory muscle usage present. No respiratory distress.     Breath sounds: Normal breath sounds.  Abdominal:     Palpations: Abdomen is soft.     Tenderness: There is no abdominal tenderness. There is no guarding or rebound.  Musculoskeletal:        General: No swelling.     Cervical back: Neck supple.     Right lower leg: No tenderness. Edema present.     Left lower leg: No tenderness. Edema present.  Skin:    General: Skin is warm and dry.     Capillary Refill: Capillary refill takes less than 2 seconds.  Neurological:     General: No focal deficit present.     Mental Status: She is alert.     ED Results / Procedures / Treatments   Labs (all labs ordered are listed, but only abnormal results are displayed) Labs Reviewed  CBC WITH DIFFERENTIAL/PLATELET - Abnormal; Notable for the following components:      Result Value   WBC 3.9 (*)    Hemoglobin 9.2 (*)    HCT 33.6 (*)    MCV 76.7 (*)    MCH 21.0 (*)    MCHC 27.4 (*)    RDW 23.6 (*)    nRBC 1.5 (*)    Lymphs Abs 0.4 (*)    All other components within normal limits  BASIC METABOLIC PANEL WITH GFR - Abnormal; Notable for the following components:   Sodium 132 (*)    Chloride 93 (*)    Glucose, Bld 101 (*)    BUN 26 (*)    Creatinine, Ser 1.06 (*)    All other components within normal limits  TROPONIN I (HIGH SENSITIVITY) - Abnormal; Notable for the following components:   Troponin I (High Sensitivity) 44 (*)    All other components within normal limits  RESP PANEL BY RT-PCR (RSV, FLU A&B, COVID)  RVPGX2  BRAIN NATRIURETIC PEPTIDE  PROTIME-INR  TROPONIN I (HIGH SENSITIVITY)    EKG EKG Interpretation Date/Time:  Tuesday May 06 2023 12:24:08  EDT Ventricular Rate:  106 PR Interval:  150 QRS Duration:  106 QT Interval:  368 QTC Calculation: 488 R Axis:   -28  Text Interpretation: Normal sinus rhythm  with PVCs Moderate voltage criteria for LVH, may be normal variant ( R in aVL , Cornell product ) Inferior-posterior infarct (cited on or before 04-Oct-2022) Anterolateral infarct (cited on or before 04-Oct-2022) Abnormal ECG Questionable change  in initial forces of Anterolateral leads Questionable change in initial forces of Inferoposterior leads compared with prior 9/24 Confirmed by Racheal Buddle [id number]) on 05/06/2023 3:39:36 PM  Radiology DG Chest 2 View Result Date: 05/06/2023 CLINICAL DATA:  Shortness of breath. EXAM: CHEST - 2 VIEW COMPARISON:  Chest radiograph dated 07/07/2022. FINDINGS: Cardiomegaly. Pulmonary vascular congestion with possible mild interstitial edema. No focal consolidation, pleural effusion, or pneumothorax. No acute osseous abnormality. IMPRESSION: Cardiomegaly with pulmonary vascular congestion and possible mild interstitial edema. Electronically Signed   By: Mannie Seek M.D.   On: 05/06/2023 15:34    Procedures Procedures  {Document cardiac monitor, telemetry assessment procedure when appropriate:1}  Medications Ordered in ED Medications - No data to display  ED Course/ Medical Decision Making/ A&P   {   Click here for ABCD2, HEART and other calculatorsREFRESH Note before signing :1}                              Medical Decision Making Amount and/or Complexity of Data Reviewed Labs: ordered. Radiology: ordered.   This patient complains of ***; this involves an extensive number of treatment Options and is a complaint that carries with it a high risk of complications and morbidity. The differential includes ***  I ordered, reviewed and interpreted labs, which included *** I ordered medication *** and reviewed PMP when indicated. I ordered imaging studies which included *** and I  independently    visualized and interpreted imaging which showed *** Additional history obtained from *** Previous records obtained and reviewed *** I consulted *** and discussed lab and imaging findings and discussed disposition.  Cardiac monitoring reviewed, *** Social determinants considered, *** Critical Interventions: ***  After the interventions stated above, I reevaluated the patient and found *** Admission and further testing considered, ***   {Document critical care time when appropriate:1} {Document review of labs and clinical decision tools ie heart score, Chads2Vasc2 etc:1}  {Document your independent review of radiology images, and any outside records:1} {Document your discussion with family members, caretakers, and with consultants:1} {Document social determinants of health affecting pt's care:1} {Document your decision making why or why not admission, treatments were needed:1} Final Clinical Impression(s) / ED Diagnoses Final diagnoses:  None    Rx / DC Orders ED Discharge Orders     None

## 2023-05-06 NOTE — Plan of Care (Signed)

## 2023-05-06 NOTE — Assessment & Plan Note (Signed)
 History of saddle PE and DVT, has IVC filter not on chronic anticoagulation with warfarin.  INR 2. - Warfarin per pharmacy.

## 2023-05-07 ENCOUNTER — Other Ambulatory Visit: Payer: Self-pay

## 2023-05-07 ENCOUNTER — Other Ambulatory Visit (HOSPITAL_COMMUNITY): Payer: Self-pay | Admitting: *Deleted

## 2023-05-07 ENCOUNTER — Inpatient Hospital Stay (HOSPITAL_COMMUNITY)

## 2023-05-07 DIAGNOSIS — I5043 Acute on chronic combined systolic (congestive) and diastolic (congestive) heart failure: Secondary | ICD-10-CM | POA: Diagnosis not present

## 2023-05-07 DIAGNOSIS — R570 Cardiogenic shock: Secondary | ICD-10-CM | POA: Diagnosis not present

## 2023-05-07 DIAGNOSIS — I5023 Acute on chronic systolic (congestive) heart failure: Secondary | ICD-10-CM | POA: Diagnosis not present

## 2023-05-07 DIAGNOSIS — R0781 Pleurodynia: Secondary | ICD-10-CM | POA: Diagnosis not present

## 2023-05-07 DIAGNOSIS — I340 Nonrheumatic mitral (valve) insufficiency: Secondary | ICD-10-CM | POA: Diagnosis not present

## 2023-05-07 DIAGNOSIS — N179 Acute kidney failure, unspecified: Secondary | ICD-10-CM

## 2023-05-07 DIAGNOSIS — I5082 Biventricular heart failure: Secondary | ICD-10-CM

## 2023-05-07 LAB — GLUCOSE, CAPILLARY: Glucose-Capillary: 139 mg/dL — ABNORMAL HIGH (ref 70–99)

## 2023-05-07 LAB — PROTIME-INR
INR: 1.9 — ABNORMAL HIGH (ref 0.8–1.2)
INR: 2.4 — ABNORMAL HIGH (ref 0.8–1.2)
Prothrombin Time: 22.4 s — ABNORMAL HIGH (ref 11.4–15.2)
Prothrombin Time: 26.7 s — ABNORMAL HIGH (ref 11.4–15.2)

## 2023-05-07 LAB — ECHOCARDIOGRAM COMPLETE
Area-P 1/2: 5.66 cm2
Height: 70 in
MV M vel: 3.3 m/s
MV Peak grad: 43.6 mmHg
Radius: 1.1 cm
S' Lateral: 6 cm
Weight: 4924.19 [oz_av]

## 2023-05-07 LAB — HEPATIC FUNCTION PANEL
ALT: 27 U/L (ref 0–44)
AST: 35 U/L (ref 15–41)
Albumin: 3.1 g/dL — ABNORMAL LOW (ref 3.5–5.0)
Alkaline Phosphatase: 122 U/L (ref 38–126)
Bilirubin, Direct: 1.2 mg/dL — ABNORMAL HIGH (ref 0.0–0.2)
Indirect Bilirubin: 1.1 mg/dL — ABNORMAL HIGH (ref 0.3–0.9)
Total Bilirubin: 2.3 mg/dL — ABNORMAL HIGH (ref 0.0–1.2)
Total Protein: 8.3 g/dL — ABNORMAL HIGH (ref 6.5–8.1)

## 2023-05-07 LAB — BASIC METABOLIC PANEL WITH GFR
Anion gap: 6 (ref 5–15)
Anion gap: 14 (ref 5–15)
BUN: 31 mg/dL — ABNORMAL HIGH (ref 6–20)
BUN: 37 mg/dL — ABNORMAL HIGH (ref 6–20)
CO2: 28 mmol/L (ref 22–32)
CO2: 25 mmol/L (ref 22–32)
Calcium: 9.1 mg/dL (ref 8.9–10.3)
Calcium: 9.1 mg/dL (ref 8.9–10.3)
Chloride: 98 mmol/L (ref 98–111)
Chloride: 91 mmol/L — ABNORMAL LOW (ref 98–111)
Creatinine, Ser: 1.24 mg/dL — ABNORMAL HIGH (ref 0.44–1.00)
Creatinine, Ser: 1.69 mg/dL — ABNORMAL HIGH (ref 0.44–1.00)
GFR, Estimated: 54 mL/min — ABNORMAL LOW (ref >60)
GFR, Estimated: 37 mL/min — ABNORMAL LOW (ref >60)
Glucose, Bld: 79 mg/dL (ref 70–99)
Glucose, Bld: 127 mg/dL — ABNORMAL HIGH (ref 70–99)
Potassium: 3.8 mmol/L (ref 3.5–5.1)
Potassium: 4.4 mmol/L (ref 3.5–5.1)
Sodium: 132 mmol/L — ABNORMAL LOW (ref 135–145)
Sodium: 130 mmol/L — ABNORMAL LOW (ref 135–145)

## 2023-05-07 LAB — CBC
HCT: 32.1 % — ABNORMAL LOW (ref 36.0–46.0)
Hemoglobin: 8.7 g/dL — ABNORMAL LOW (ref 12.0–15.0)
MCH: 21.4 pg — ABNORMAL LOW (ref 26.0–34.0)
MCHC: 27.1 g/dL — ABNORMAL LOW (ref 30.0–36.0)
MCV: 79.1 fL — ABNORMAL LOW (ref 80.0–100.0)
Platelets: 169 10*3/uL (ref 150–400)
RBC: 4.06 MIL/uL (ref 3.87–5.11)
RDW: 23.6 % — ABNORMAL HIGH (ref 11.5–15.5)
WBC: 4.8 10*3/uL (ref 4.0–10.5)
nRBC: 2.5 % — ABNORMAL HIGH (ref 0.0–0.2)

## 2023-05-07 LAB — MRSA NEXT GEN BY PCR, NASAL
MRSA by PCR Next Gen: NOT DETECTED
MRSA by PCR Next Gen: NOT DETECTED

## 2023-05-07 LAB — LACTIC ACID, PLASMA: Lactic Acid, Venous: 2.7 mmol/L (ref 0.5–1.9)

## 2023-05-07 LAB — TROPONIN I (HIGH SENSITIVITY): Troponin I (High Sensitivity): 41 ng/L — ABNORMAL HIGH (ref <18)

## 2023-05-07 MED ORDER — ALBUTEROL SULFATE (2.5 MG/3ML) 0.083% IN NEBU: 2.5000 mg | INHALATION_SOLUTION | RESPIRATORY_TRACT | Status: DC | PRN

## 2023-05-07 MED ORDER — INSULIN ASPART 100 UNIT/ML IJ SOLN
0.0000 [IU] | INTRAMUSCULAR | Status: DC
Start: 2023-05-08 — End: 2023-05-15
  Administered 2023-05-07 – 2023-05-12 (×9): 2 [IU] via SUBCUTANEOUS
  Administered 2023-05-13: 3 [IU] via SUBCUTANEOUS
  Administered 2023-05-14 (×3): 2 [IU] via SUBCUTANEOUS

## 2023-05-07 MED ORDER — PANTOPRAZOLE SODIUM 40 MG IV SOLR
40.0000 mg | Freq: Two times a day (BID) | INTRAVENOUS | Status: DC
  Administered 2023-05-07 – 2023-05-08 (×3): 40 mg via INTRAVENOUS
  Filled 2023-05-07 (×3): qty 10

## 2023-05-07 MED ORDER — EMPAGLIFLOZIN 10 MG PO TABS: 10.0000 mg | ORAL_TABLET | Freq: Every day | ORAL | Status: DC

## 2023-05-07 MED ORDER — IPRATROPIUM-ALBUTEROL 0.5-2.5 (3) MG/3ML IN SOLN: 3.0000 mL | Freq: Four times a day (QID) | RESPIRATORY_TRACT | Status: DC | PRN

## 2023-05-07 MED ORDER — METOCLOPRAMIDE HCL 5 MG/ML IJ SOLN
5.0000 mg | Freq: Once | INTRAMUSCULAR | Status: AC
  Administered 2023-05-07: 5 mg via INTRAVENOUS
  Filled 2023-05-07: qty 2

## 2023-05-07 MED ORDER — NOREPINEPHRINE 4 MG/250ML-% IV SOLN
0.0000 ug/min | INTRAVENOUS | Status: DC
Start: 2023-05-07 — End: 2023-05-08
  Administered 2023-05-07: 6 ug/min via INTRAVENOUS
  Filled 2023-05-07: qty 250

## 2023-05-07 MED ORDER — NOREPINEPHRINE 4 MG/250ML-% IV SOLN: 2.0000 ug/min | INTRAVENOUS | Status: DC

## 2023-05-07 MED ORDER — WARFARIN SODIUM 7.5 MG PO TABS
15.0000 mg | ORAL_TABLET | Freq: Once | ORAL | Status: AC
  Administered 2023-05-07: 15 mg via ORAL
  Filled 2023-05-07: qty 2

## 2023-05-07 MED ORDER — SODIUM CHLORIDE 0.9 % IV SOLN
250.0000 mL | INTRAVENOUS | Status: DC
  Administered 2023-05-07: 250 mL via INTRAVENOUS

## 2023-05-07 MED ORDER — NOREPINEPHRINE 4 MG/250ML-% IV SOLN
INTRAVENOUS | Status: AC
  Administered 2023-05-07: 2 ug/min via INTRAVENOUS
  Filled 2023-05-07: qty 250

## 2023-05-07 MED ORDER — SODIUM CHLORIDE 0.9% FLUSH: 10.0000 mL | INTRAVENOUS | Status: DC | PRN

## 2023-05-07 MED ORDER — NOREPINEPHRINE 4 MG/250ML-% IV SOLN: 0.0000 ug/min | INTRAVENOUS | Status: DC

## 2023-05-07 MED ORDER — IPRATROPIUM-ALBUTEROL 0.5-2.5 (3) MG/3ML IN SOLN
RESPIRATORY_TRACT | Status: AC
  Administered 2023-05-07: 3 mL
  Filled 2023-05-07: qty 3

## 2023-05-07 MED ORDER — HEPARIN (PORCINE) 25000 UT/250ML-% IV SOLN: 1500.0000 [IU]/h | INTRAVENOUS | Status: DC

## 2023-05-07 MED ORDER — LOSARTAN POTASSIUM 25 MG PO TABS: 12.5000 mg | ORAL_TABLET | Freq: Every day | ORAL | Status: DC

## 2023-05-07 MED ORDER — CHLORHEXIDINE GLUCONATE CLOTH 2 % EX PADS: 6.0000 | MEDICATED_PAD | Freq: Every day | CUTANEOUS | Status: DC

## 2023-05-07 MED ORDER — DOBUTAMINE-DEXTROSE 4-5 MG/ML-% IV SOLN
7.5000 ug/kg/min | INTRAVENOUS | Status: DC
  Administered 2023-05-07 – 2023-05-11 (×4): 5 ug/kg/min via INTRAVENOUS
  Administered 2023-05-12: 7.5 ug/kg/min via INTRAVENOUS
  Administered 2023-05-12: 5 ug/kg/min via INTRAVENOUS
  Filled 2023-05-07 (×6): qty 250

## 2023-05-07 MED ORDER — FUROSEMIDE 10 MG/ML IJ SOLN: 60.0000 mg | Freq: Two times a day (BID) | INTRAMUSCULAR | Status: DC

## 2023-05-07 MED ORDER — SODIUM CHLORIDE 0.9% FLUSH
10.0000 mL | Freq: Two times a day (BID) | INTRAVENOUS | Status: DC
  Administered 2023-05-07 – 2023-05-10 (×5): 10 mL
  Administered 2023-05-11: 30 mL
  Administered 2023-05-12 – 2023-05-16 (×9): 10 mL

## 2023-05-07 MED ORDER — PERFLUTREN LIPID MICROSPHERE
1.0000 mL | INTRAVENOUS | Status: AC | PRN
Start: 2023-05-07 — End: 2023-05-07
  Administered 2023-05-07: 3 mL via INTRAVENOUS

## 2023-05-07 MED ORDER — FUROSEMIDE 10 MG/ML IJ SOLN
10.0000 mg/h | INTRAVENOUS | Status: DC
  Administered 2023-05-07: 12 mg/h via INTRAVENOUS
  Administered 2023-05-07: 20 mg/h via INTRAVENOUS
  Administered 2023-05-08: 30 mg/h via INTRAVENOUS
  Administered 2023-05-08 – 2023-05-11 (×8): 20 mg/h via INTRAVENOUS
  Administered 2023-05-12: 10 mg/h via INTRAVENOUS
  Administered 2023-05-12: 20 mg/h via INTRAVENOUS
  Administered 2023-05-13: 10 mg/h via INTRAVENOUS
  Filled 2023-05-07 (×17): qty 20

## 2023-05-07 MED ORDER — MILRINONE LACTATE IN DEXTROSE 20-5 MG/100ML-% IV SOLN
0.3750 ug/kg/min | INTRAVENOUS | Status: DC
  Administered 2023-05-07 (×2): 0.375 ug/kg/min via INTRAVENOUS
  Filled 2023-05-07: qty 100

## 2023-05-07 MED ORDER — CHLORHEXIDINE GLUCONATE CLOTH 2 % EX PADS
6.0000 | MEDICATED_PAD | Freq: Every day | CUTANEOUS | Status: DC
  Administered 2023-05-07: 6 via TOPICAL

## 2023-05-07 NOTE — Progress Notes (Signed)
   05/07/23 2206  BiPAP/CPAP/SIPAP  BiPAP/CPAP/SIPAP Pt Type Adult  BiPAP/CPAP/SIPAP Resmed  Mask Type Full face mask  Mask Size Medium  Respiratory Rate 21 breaths/min  EPAP 10 cmH2O  FiO2 (%) 36 %  Flow Rate 4 lpm  Patient Home Machine No  Patient Home Mask No  Patient Home Tubing No  Auto Titrate No  BiPAP/CPAP /SiPAP Vitals  Pulse Rate (!) 58  Resp (!) 21  SpO2 98 %  Bilateral Breath Sounds Clear;Diminished  MEWS Score/Color  MEWS Score 2  MEWS Score Color Yellow

## 2023-05-07 NOTE — Progress Notes (Signed)
 RT called for patient being short of breath. Patient has again taken her CPAP off. States she doesn't want to wear it anymore tonight. Patient states she has an inhaler at home as needed for breathing. Duo Q6H PRN order placed per RT protocol and treatment given. Bilateral breath sounds diminished but clear; O2 96% on room air; RR: 22. Chest xray shows pulmonary edema. Patient reports productive cough. Will place back on 2 lpm nasal cannula post treatment.

## 2023-05-07 NOTE — Progress Notes (Signed)
 Telemetry called and stated patient had a 3 beat run of V-tach. Upon entering patients room, patient was sitting on side of bed and stated her chest was tight and throbbing and she could not catch her breath. This nurse performed EKG at bedside. EKG read STEMI. Charge nurse was notified and asked to call a Rapid Response. VSS. Dr. Ardelia Kohut at bedside and patient transferred to ICU. Patients husband at bedside and all belongings taken with patient.

## 2023-05-07 NOTE — Consult Note (Signed)
 PHARMACY - ANTICOAGULATION CONSULT NOTE  Pharmacy Consult for Warfarin Indication: history of pulmonary embolus and DVT  Allergies  Allergen Reactions   Sulfa Antibiotics Itching, Swelling and Rash    Lip swelling    Patient Measurements: Height: 5\' 10"  (177.8 cm) Weight: (!) 139.6 kg (307 lb 12.2 oz) IBW/kg (Calculated) : 68.5 HEPARIN DW (KG): 101.8  Vital Signs: Temp: 98.3 F (36.8 C) (04/16 0740) Temp Source: Oral (04/16 0740) BP: 110/94 (04/16 0202) Pulse Rate: 89 (04/16 0202)  Labs: Recent Labs    05/06/23 1329 05/06/23 1603 05/07/23 0028  HGB 9.2*  --   --   HCT 33.6*  --   --   PLT 186  --   --   LABPROT  --  23.1* 22.4*  INR  --  2.0* 1.9*  CREATININE 1.06*  --  1.24*  TROPONINIHS 44* 48*  --     Estimated Creatinine Clearance: 86.7 mL/min (A) (by C-G formula based on SCr of 1.24 mg/dL (H)).   Medical History: Past Medical History:  Diagnosis Date   Anemia    Blood transfusion without reported diagnosis    CAD (coronary artery disease)    Nonobstructive   Closed left ankle fracture    August 30 2012   Clotting disorder [facility])    DVT (deep venous thrombosis) (HCC)    L leg   Fibroid tumor    HFrEF (heart failure with reduced ejection fraction) (HCC)    Hidradenitis    Hypertension    Mitral regurgitation    Myocardial infarction (HCC)    NICM (nonischemic cardiomyopathy) (HCC)    NSVT (nonsustained ventricular tachycardia) (HCC)    Obesity    OSA on CPAP    Pulmonary embolism (HCC) 04/2013   Sleep apnea     Medications:  Warfarin 15mg  on Wednesdays and Saturdays, 10mg  all other days --Last dose taken: 10mg  on 4/14@1915   Assessment: 48 y.o. female with medical history significant for systolic CHF, hypertension, DVT and PE on chronic anticoagulation with warfarin, IVC status, OSA on CPAP presented to the ED with complaints of increasing difficulty breathing, and swelling to her bilateral lower extremities up to her thighs that started 2  days ago. She reports her baseline weight is about 274 pounds, she is weighing 299 here in the ED. She has had to sleep sitting up. Pharmacy has been consulted to manage warfarin while admitted.  INR 2> 1.9, just slightly subtherapeutic  Of note: patient's home warfarin regimen recently changed(per anticoagulation clinic note on 04/30/2023) from 10mg  daily to 15mg  on Wednesday and Saturdays, and 10mg  all other days.  Goal of Therapy:  INR 2-3 Monitor platelets by anticoagulation protocol: Yes  Plan:  Warfarin 15mg  this evening INR/CBC daily Monitor for S/S of bleeding  Lora Robinsons, BS Pharm D, BCPS Clinical Pharmacist 05/07/2023,8:37 AM

## 2023-05-07 NOTE — Progress Notes (Deleted)
    Notified by the patient's nurse that she is having hypotension with BP as low as 67/29. Stopped Lasix drip. Reviewed with Dr. Londa Rival and will place a PICC and start Levophed. Hold additional diuretics for now. He did recommend transfer to [facility] and evaluation by [facility] once there. Spoke to Dr. Winferd Hatter who has been to see the patient and responding well and SBP checked again and in the 80's. Will continue with plans as discussed above. Will check a Lactic Acid. Check Co-ox once PICC in place. Will made Card Master and [facility] aware of pending transfer.   Signed, Dorma Gash, PA-C 05/07/2023, 4:32 PM Pager: [phone]

## 2023-05-07 NOTE — TOC CM/SW Note (Signed)
 Transition of Care [facility]) - Inpatient Brief Assessment   Patient Details  Name: [name] [name] MRN: [id number] Date of Birth: 08-Apr-1975  [facility] CM/SW Contact:    Grandville Lax, LCSWA Phone Number: 05/07/2023, 9:26 AM   Clinical Narrative: Transition of Care Department [facility]) has reviewed patient and no TOC needs have been identified at this time. We will continue to monitor patient advancement through interdiciplinary progression rounds. If new patient transition needs arise, please place a TOC consult.   Transition of Care Asessment: Insurance and Status: Insurance coverage has been reviewed Patient has primary care physician: Yes Home environment has been reviewed: from home Prior level of function:: independent Prior/Current Home Services: No current home services Social Drivers of Health Review: SDOH reviewed no interventions necessary Readmission risk has been reviewed: Yes Transition of care needs: no transition of care needs at this time

## 2023-05-07 NOTE — Consult Note (Addendum)
 CARDIOLOGY CONSULT NOTE    Patient ID: [name] [name]; [id number]; 1975-11-09   Admit date: 05/06/2023 Date of Consult: 05/07/2023  Primary Care Provider: Rosanna Comment, FNP Primary Cardiologist:  Primary Electrophysiologist:    History of Present Illness:   [name] is a 48 year old F known to have NICM LVEF 25 to 30% with no device, history of unprovoked saddle PE on indefinite AC, severe MR, nonobstructive CAD (RCA 40 to 50% stenosis in 2023), OSA on CPAP presented to ER with DOE, abdomen distention, bilateral leg swelling x 1 month, intermittent, gradually worsening and had severe DOE to the point that she was not able to breathe which prompted ER visit.  BNP was elevated, 1671.  She received LHC/RHC in the past.  LHC showed nonobstructive CAD.  RHC showed normal RA pressures and mild elevated left ventricular pressures.  Group 2 pulmonary hypertension.  She also has severe MR on echocardiogram but she may structural heart appointment for TEER.  She is currently worked up for LVAD.  Follows with advanced heart failure team.  She has a family history of congestive heart failure in her father, aunt and uncle (all paternal).  One had a history of heart transplant or LVAD.  Past Medical History:  Diagnosis Date   Anemia    Blood transfusion without reported diagnosis    CAD (coronary artery disease)    Nonobstructive   Closed left ankle fracture    August 30 2012   Clotting disorder [facility])    DVT (deep venous thrombosis) (HCC)    L leg   Fibroid tumor    HFrEF (heart failure with reduced ejection fraction) (HCC)    Hidradenitis    Hypertension    Mitral regurgitation    Myocardial infarction [facility])    NICM (nonischemic cardiomyopathy) (HCC)    NSVT (nonsustained ventricular tachycardia) (HCC)    Obesity    OSA on CPAP    Pulmonary embolism (HCC) 04/2013   Sleep apnea     Past Surgical History:  Procedure Laterality Date   ABSCESS DRAINAGE Bilateral 01/10/2015    BREAST BIOPSY Right 02/01/2020   benign   COLONOSCOPY WITH PROPOFOL N/A 06/08/2018   Procedure: COLONOSCOPY WITH PROPOFOL;  Surgeon: Suzette Espy, MD;  [facility];  Service: Endoscopy;  Laterality: N/A;  2:30pm   CYST REMOVAL TRUNK     IVC FILTER PLACEMENT ([facility] HX)     LOWER EXTREMITY VENOGRAPHY N/A 02/07/2021   Procedure: LOWER EXTREMITY VENOGRAPHY;  Surgeon: Kayla Part, MD;  Location: [facility] INVASIVE CV LAB;  Service: Cardiovascular;  Laterality: N/A;   PERIPHERAL VASCULAR THROMBECTOMY N/A 02/07/2021   Procedure: PERIPHERAL VASCULAR THROMBECTOMY;  Surgeon: Kayla Part, MD;  Location: [facility] INVASIVE CV LAB;  Service: Cardiovascular;  Laterality: N/A;   RIGHT HEART CATH N/A 03/08/2022   Procedure: RIGHT HEART CATH;  Surgeon: Alwin Baars, DO;  Location: [facility] INVASIVE CV LAB;  Service: Cardiovascular;  Laterality: N/A;   RIGHT HEART CATH N/A 10/04/2022   Procedure: RIGHT HEART CATH;  Surgeon: Alwin Baars, DO;  Location: [facility] INVASIVE CV LAB;  Service: Cardiovascular;  Laterality: N/A;   RIGHT/LEFT HEART CATH AND CORONARY ANGIOGRAPHY N/A 06/19/2021   Procedure: RIGHT/LEFT HEART CATH AND CORONARY ANGIOGRAPHY;  Surgeon: Arty Binning, MD;  Location: [facility] INVASIVE CV LAB;  Service: Cardiovascular;  Laterality: N/A;   TEE WITHOUT CARDIOVERSION N/A 03/08/2022   Procedure: TRANSESOPHAGEAL ECHOCARDIOGRAM (TEE);  Surgeon: Alwin Baars, DO;  Location: [facility] ENDOSCOPY;  Service:  Cardiovascular;  Laterality: N/A;       Inpatient Medications: Scheduled Meds:  atorvastatin  20 mg Oral Daily   Chlorhexidine Gluconate Cloth  6 each Topical Q0600   furosemide  40 mg Intravenous Q12H   losartan  25 mg Oral Daily   metoprolol succinate  25 mg Oral Daily   warfarin  15 mg Oral ONCE-1600   Warfarin - Pharmacist Dosing Inpatient   Does not apply q1600   Continuous Infusions:  PRN Meds: acetaminophen **OR** acetaminophen, guaiFENesin-dextromethorphan, ipratropium-albuterol,  ondansetron **OR** ondansetron (ZOFRAN) IV, polyethylene glycol  Allergies:    Allergies  Allergen Reactions   Sulfa Antibiotics Itching, Swelling and Rash    Lip swelling    Social History:   Social History   Socioeconomic History   Marital status: Widowed    Spouse name: Not on file   Number of children: Not on file   Years of education: Not on file   Highest education level: Some college, no degree  Occupational History   Not on file  Tobacco Use   Smoking status: Every Day    Types: Cigarettes    Passive exposure: Never   Smokeless tobacco: Never   Tobacco comments:    Smokes 2-3 cigarettes daily as of 02/13/22  Vaping Use   Vaping status: Never Used  Substance and Sexual Activity   Alcohol use: Yes    Alcohol/week: 2.0 standard drinks of alcohol    Types: 2 Cans of beer per week    Comment: twice a week   Drug use: No   Sexual activity: Not Currently    Birth control/protection: Pill, None  Other Topics Concern   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    Has a 47 year old [name] [name].   Live with parents.   Was working 5 days a weeks.   Not working right now.    Attends church.    Social Drivers of Corporate investment banker Strain: Low Risk  (04/03/2023)   Overall Financial Resource Strain (CARDIA)    Difficulty of Paying Living Expenses: Not very hard  Food Insecurity: No Food Insecurity (05/06/2023)   Hunger Vital Sign    Worried About Running Out of Food in the Last Year: Never true    Ran Out of Food in the Last Year: Never true  Transportation Needs: No Transportation Needs (05/06/2023)   PRAPARE - Administrator, Civil Service (Medical): No    Lack of Transportation (Non-Medical): No  Physical Activity: Insufficiently Active (04/03/2023)   Exercise Vital Sign    Days of Exercise per Week: 2 days    Minutes of Exercise per Session: 10 min  Stress: No Stress Concern Present  (04/03/2023)   [employer] of Occupational Health - Occupational Stress Questionnaire    Feeling of Stress : Only a little  Social Connections: Socially Isolated (04/03/2023)   Social Connection and Isolation Panel [NHANES]    Frequency of Communication with Friends and Family: Twice a week    Frequency of Social Gatherings with Friends and Family: Twice a week    Attends Religious Services: Never    Database administrator or Organizations: No    Attends Banker Meetings: Never    Marital Status: Widowed  Intimate Partner Violence: Not At Risk (05/06/2023)   Humiliation, Afraid, Rape, and Kick questionnaire    Fear of Current or Ex-Partner: No  Emotionally Abused: No    Physically Abused: No    Sexually Abused: No    Family History:    Family History  Problem Relation Age of Onset   Hypertension Mother    Diabetes Paternal Aunt    Diabetes Paternal Uncle    Other Father        blood clots   Cancer Maternal Grandmother    Heart attack Paternal Grandmother    Colon cancer Neg Hx      ROS:  Please see the history of present illness.  ROS  All other ROS reviewed and negative.     Physical Exam/Data:   Vitals:   05/07/23 0202 05/07/23 0426 05/07/23 0740 05/07/23 0900  BP: (!) 110/94     Pulse: 89   76  Resp: (!) 26     Temp:  98.3 F (36.8 C) 98.3 F (36.8 C)   TempSrc:  Oral Oral   SpO2: 100% 96%  90%  Weight:      Height:        Intake/Output Summary (Last 24 hours) at 05/07/2023 1034 Last data filed at 05/07/2023 0839 Gross per 24 hour  Intake 480 ml  Output --  Net 480 ml   Filed Weights   05/06/23 1220 05/06/23 1852 05/07/23 0017  Weight: 135.6 kg (!) 138.3 kg (!) 139.6 kg   Body mass index is 44.16 kg/m.  General:  Well nourished, well developed, in no acute distress HEENT: normal Lymph: no adenopathy Neck:  JVD+ Endocrine:  No thryomegaly Vascular: No carotid bruits; FA pulses 2+ bilaterally without bruits  Cardiac:  normal S1,  S2; RRR; no murmur  Lungs:  clear to auscultation bilaterally, no wheezing, rhonchi or rales  Abd: soft, nontender, no hepatomegaly  Ext: 3+ pitting edema Musculoskeletal:  No deformities, BUE and BLE strength normal and equal Skin: warm and dry  Neuro:  CNs 2-12 intact, no focal abnormalities noted Psych:  Normal affect    Laboratory Data:  Chemistry Recent Labs  Lab 05/06/23 1329 05/07/23 0028  NA 132* 132*  K 3.6 3.8  CL 93* 98  CO2 27 28  GLUCOSE 101* 79  BUN 26* 31*  CREATININE 1.06* 1.24*  CALCIUM 9.2 9.1  GFRNONAA >60 54*  ANIONGAP 12 6    No results for input(s): "PROT", "ALBUMIN", "AST", "ALT", "ALKPHOS", "BILITOT" in the last 168 hours. Hematology Recent Labs  Lab 05/06/23 1329  WBC 3.9*  RBC 4.38  HGB 9.2*  HCT 33.6*  MCV 76.7*  MCH 21.0*  MCHC 27.4*  RDW 23.6*  PLT 186   Cardiac EnzymesNo results for input(s): "TROPONINI" in the last 168 hours. No results for input(s): "TROPIPOC" in the last 168 hours.  BNP Recent Labs  Lab 05/06/23 1603  BNP 1,671.0*    DDimer No results for input(s): "DDIMER" in the last 168 hours.  Radiology/Studies:  DG Chest 2 View Result Date: 05/06/2023 CLINICAL DATA:  Shortness of breath. EXAM: CHEST - 2 VIEW COMPARISON:  Chest radiograph dated 07/07/2022. FINDINGS: Cardiomegaly. Pulmonary vascular congestion with possible mild interstitial edema. No focal consolidation, pleural effusion, or pneumothorax. No acute osseous abnormality. IMPRESSION: Cardiomegaly with pulmonary vascular congestion and possible mild interstitial edema. Electronically Signed   By: Mannie Seek M.D.   On: 05/06/2023 15:34    Assessment and Plan:   Acute on chronic systolic heart failure : Presented with worsening DOE, abdominal distention, leg swelling x 1 month, worsened for 4 days prior to presentation.  BNP elevated, 1671.  Serum creatinine 1 yesterday that increased to 1.24 today.  Obtain hepatic panel.  Currently on IV Lasix 40 mg twice  daily.  Will switch to Lasix drip starting at 12 mg/hr. continue metoprolol succinate 25 mg once daily and losartan 25 mg once daily.  Keep K>4 and <5, Mg>2 and <3. BMP in PM.  Follows with advanced heart failure team in the outpatient setting.  Currently worked up for LVAD.  She missed appointment with structural heart for evaluation of TEER for severe MR.  Severe MR: Repeat echocardiogram is pending.  She missed appointment with structural heart clinic previously.  Will make another appointment.  History of unprovoked saddle pulmonary embolism in 2015: Hematology recommended indefinite systemic".  Continue Coumadin.  She was previously seen by [facility] who did not recommend DOAC as there were no studies in patients more than 250 pounds, per review of her cardiologist note.  OSA on CPAP: Continue CPAP.   For questions or updates, please contact [facility] Please consult [email or web address] for contact info under Cardiology/STEMI.   Signed, Easton Sivertson Priya Carylon Tamburro, MD 05/07/2023 10:34 AM

## 2023-05-07 NOTE — Consult Note (Signed)
 NAME:  [name] [name], MRN:  [id number], DOB:  April 05, 1975, LOS: 1 ADMISSION DATE:  05/06/2023, CONSULTATION DATE:  05/07/23 REFERRING MD:  Tat , CHIEF COMPLAINT:  cardiogenic shock   History of Present Illness:  48 year old female with PMH as below, which is significant for HFrEF NICM wit LEVF 25-30%, Severe MR, PE on lifelong anticoagulation and IVC filter in place, OSA non-compliant with CPAP who presented to [facility] ED 4/15 with complaints of progressive shortness of breath and lower extremity edema. Based on clinic notes it seems as though edema had been worsening for some time. Diuretic dosing had been increasing and she was transitioned from lasix to torsemide in early April. Shortness of breath started about 4-5 days prior to arrival.  She had associated cough with clear sputum.  Denies fevers or chills.  Shortness of breath became severe on 4/15 causing her to present to [facility], ED.  She did also complain of mild chest tightness and headaches.  In the emergency department she was treated for CHF exacerbation with diuretics and was admitted to the hospitalist service.  She was noted to have a 25 pound weight gain.  GDMT was resumed in the hospital with the exception of Entresto which she had not been taking due to low blood pressures.  BNP 1671.  Cardiology was consulted and initiated Lasix infusion.  She became hypotensive and was started on norepinephrine.  She was transferred to [facility] for advanced heart failure evaluation.  PCCM accepted for ICU admission.  Pertinent  Medical History  Combined systolic and diastolic HF pHTN OSA PE on warfarin  Medical noncompliance  Significant Hospital Events: Including procedures, antibiotic start and stop dates in addition to other pertinent events     Interim History / Subjective:    Objective   Blood pressure (!) 81/41, pulse (!) 52, temperature 97.8 F (36.6 C), temperature source Oral, resp. rate (!) 23, height 5\' 10"   (1.778 m), weight (!) 139.6 kg, last menstrual period 03/25/2023, SpO2 90%.        Intake/Output Summary (Last 24 hours) at 05/07/2023 1748 Last data filed at 05/07/2023 1743 Gross per 24 hour  Intake 764.39 ml  Output 250 ml  Net 514.39 ml   Filed Weights   05/06/23 1220 05/06/23 1852 05/07/23 0017  Weight: 135.6 kg (!) 138.3 kg (!) 139.6 kg    Examination: General: Middle-aged female in no acute distress HENT: Normocephalic, atraumatic, PERRL Lungs: Clear bilateral breath sounds, normal work of breathing Cardiovascular: Regular rate and rhythm, 4+ pitting edema in bilateral lower extremities to the knee Abdomen: Soft, nontender, nondistended, anasarca Extremities: No acute deformity or range of motion limitation.  Edema noted above Neuro: Alert, oriented, nonfocal GU: Foley catheter  Resolved Hospital Problem list     Assessment & Plan:   Cardiogenic shock  Acute on chronic HFrEF: LVEF 25 to 30%. Severe Mitral regurg  Nonobstructive CAD - Admit to ICU - Appreciate advanced heart failure consultation - Milrinone being transitioned to dobutamine - Norepinephrine infusion for MAP goal 70 - Lasix infusion - Strict intake and output - PICC requested as central line currently is femoral.  Will check central venous oxygen saturation once PICC in place. - Possibility of TEER - Not a candidate for transplant - Poor candidate for VAD - Ensure lactic is clearing  PE - Heparin infusion per pharmacy - IVC filter in place, could not be removed due to thrombus burden - Holding home warfarin, weight prohibits DOAC  AKI: Creatinine bump from 1-1.69 - Trend chemistries - Keep K greater than 4 and mag greater than 2  OSA: Noncompliant with CPAP - Will order CPAP for here, she is willing to use.   Best Practice (right click and "Reselect all SmartList Selections" daily)   Diet/type: NPO DVT prophylaxis systemic heparin Pressure ulcer(s): pressure ulcer assessment deferred   GI prophylaxis: N/A Lines: Central line Foley:  Yes, and it is still needed Code Status:  full code Last date of multidisciplinary goals of care discussion [ ]   Labs   CBC: Recent Labs  Lab 05/06/23 1329 05/07/23 1701  WBC 3.9* 4.8  NEUTROABS 2.9  --   HGB 9.2* 8.7*  HCT 33.6* 32.1*  MCV 76.7* 79.1*  PLT 186 169    Basic Metabolic Panel: Recent Labs  Lab 05/06/23 1329 05/06/23 1603 05/07/23 0028  NA 132*  --  132*  K 3.6  --  3.8  CL 93*  --  98  CO2 27  --  28  GLUCOSE 101*  --  79  BUN 26*  --  31*  CREATININE 1.06*  --  1.24*  CALCIUM 9.2  --  9.1  MG  --  2.1  --    GFR: Estimated Creatinine Clearance: 86.7 mL/min (A) (by C-G formula based on SCr of 1.24 mg/dL (H)). Recent Labs  Lab 05/06/23 1329 05/07/23 1701  WBC 3.9* 4.8    Liver Function Tests: Recent Labs  Lab 05/07/23 1102  AST 35  ALT 27  ALKPHOS 122  BILITOT 2.3*  PROT 8.3*  ALBUMIN 3.1*   No results for input(s): "LIPASE", "AMYLASE" in the last 168 hours. No results for input(s): "AMMONIA" in the last 168 hours.  ABG    Component Value Date/Time   PHART 7.433 06/19/2021 1057   PCO2ART 41.7 06/19/2021 1057   PO2ART 80 (L) 06/19/2021 1057   HCO3 27.8 10/04/2022 1146   TCO2 29 10/04/2022 1146   O2SAT 59 10/04/2022 1146     Coagulation Profile: Recent Labs  Lab 05/06/23 1603 05/07/23 0028  INR 2.0* 1.9*    Cardiac Enzymes: No results for input(s): "CKTOTAL", "CKMB", "CKMBINDEX", "TROPONINI" in the last 168 hours.  HbA1C: Hgb A1c MFr Bld  Date/Time Value Ref Range Status  09/16/2022 08:20 AM 5.5 4.8 - 5.6 % Final    Comment:             Prediabetes: 5.7 - 6.4          Diabetes: >6.4          Glycemic control for adults with diabetes: <7.0   01/31/2017 11:19 AM 5.3 <5.7 % of total Hgb Final    Comment:    For the purpose of screening for the presence of diabetes: . <5.7%       Consistent with the absence of diabetes 5.7-6.4%    Consistent with increased risk for  diabetes             (prediabetes) > or =6.5%  Consistent with diabetes . This assay result is consistent with a decreased risk of diabetes. . Currently, no consensus exists regarding use of hemoglobin A1c for diagnosis of diabetes in children. . According to American Diabetes Association (ADA) guidelines, hemoglobin A1c <7.0% represents optimal control in non-pregnant diabetic patients. Different metrics may apply to specific patient populations.  Standards of Medical Care in Diabetes(ADA). .     CBG: No results for input(s): "GLUCAP" in the last 168 hours.  Review of  Systems:   Bolds are positive  Constitutional: weight loss, gain, night sweats, Fevers, chills, fatigue .  HEENT: headaches, Sore throat, sneezing, nasal congestion, post nasal drip, Difficulty swallowing, Tooth/dental problems, visual complaints visual changes, ear ache CV:  chest pain, radiates:,Orthopnea, PND, swelling in lower extremities, dizziness, palpitations, syncope.  GI  heartburn, indigestion, abdominal pain, nausea, vomiting, diarrhea, change in bowel habits, loss of appetite, bloody stools.  Resp: cough, productive: , hemoptysis, dyspnea, chest pain, pleuritic.  Skin: rash or itching or icterus GU: dysuria, change in color of urine, urgency or frequency. flank pain, hematuria  MS: joint pain or swelling. decreased range of motion  Psych: change in mood or affect. depression or anxiety.  Neuro: difficulty with speech, weakness, numbness, ataxia    Past Medical History:  She,  has a past medical history of Anemia, Blood transfusion without reported diagnosis, CAD (coronary artery disease), Closed left ankle fracture, Clotting disorder (HCC), DVT (deep venous thrombosis) (HCC), Fibroid tumor, HFrEF (heart failure with reduced ejection fraction) (HCC), Hidradenitis, Hypertension, Mitral regurgitation, Myocardial infarction (HCC), NICM (nonischemic cardiomyopathy) (HCC), NSVT (nonsustained ventricular  tachycardia) (HCC), Obesity, OSA on CPAP, Pulmonary embolism (HCC) (04/2013), and Sleep apnea.   Surgical History:   Past Surgical History:  Procedure Laterality Date   ABSCESS DRAINAGE Bilateral 01/10/2015   BREAST BIOPSY Right 02/01/2020   benign   COLONOSCOPY WITH PROPOFOL N/A 06/08/2018   Procedure: COLONOSCOPY WITH PROPOFOL;  Surgeon: Suzette Espy, MD;  [facility];  Service: Endoscopy;  Laterality: N/A;  2:30pm   CYST REMOVAL TRUNK     IVC FILTER PLACEMENT ([facility] HX)     LOWER EXTREMITY VENOGRAPHY N/A 02/07/2021   Procedure: LOWER EXTREMITY VENOGRAPHY;  Surgeon: Kayla Part, MD;  Location: [facility] INVASIVE CV LAB;  Service: Cardiovascular;  Laterality: N/A;   PERIPHERAL VASCULAR THROMBECTOMY N/A 02/07/2021   Procedure: PERIPHERAL VASCULAR THROMBECTOMY;  Surgeon: Kayla Part, MD;  Location: [facility] INVASIVE CV LAB;  Service: Cardiovascular;  Laterality: N/A;   RIGHT HEART CATH N/A 03/08/2022   Procedure: RIGHT HEART CATH;  Surgeon: Alwin Baars, DO;  Location: [facility] INVASIVE CV LAB;  Service: Cardiovascular;  Laterality: N/A;   RIGHT HEART CATH N/A 10/04/2022   Procedure: RIGHT HEART CATH;  Surgeon: Alwin Baars, DO;  Location: [facility] INVASIVE CV LAB;  Service: Cardiovascular;  Laterality: N/A;   RIGHT/LEFT HEART CATH AND CORONARY ANGIOGRAPHY N/A 06/19/2021   Procedure: RIGHT/LEFT HEART CATH AND CORONARY ANGIOGRAPHY;  Surgeon: Arty Binning, MD;  Location: [facility] INVASIVE CV LAB;  Service: Cardiovascular;  Laterality: N/A;   TEE WITHOUT CARDIOVERSION N/A 03/08/2022   Procedure: TRANSESOPHAGEAL ECHOCARDIOGRAM (TEE);  Surgeon: Alwin Baars, DO;  Location: [facility] ENDOSCOPY;  Service: Cardiovascular;  Laterality: N/A;     Social History:   reports that she has been smoking cigarettes. She has never been exposed to tobacco smoke. She has never used smokeless tobacco. She reports current alcohol use of about 2.0 standard drinks of alcohol per week. She reports that she does not  use drugs.   Family History:  Her family history includes Cancer in her maternal grandmother; Diabetes in her paternal aunt and paternal uncle; Heart attack in her paternal grandmother; Hypertension in her mother; Other in her father. There is no history of Colon cancer.   Allergies Allergies  Allergen Reactions   Sulfa Antibiotics Itching, Swelling and Rash    Lip swelling     Home Medications  Prior to Admission medications   Medication Sig Start Date End Date  Taking? Authorizing Provider  albuterol (VENTOLIN HFA) 108 (90 Base) MCG/ACT inhaler Inhale 2 puffs into the lungs every 2 (two) hours as needed for wheezing or shortness of breath. 07/09/22  Yes Emokpae, Courage, MD  atorvastatin (LIPITOR) 20 MG tablet TAKE ONE TABLET BY MOUTH ONCE DAILY. 04/30/23  Yes Branch, Joyceann No, MD  benzonatate (TESSALON) 100 MG capsule Take 1 capsule (100 mg total) by mouth 3 (three) times daily as needed for cough. Do not take with alcohol or while operating or driving heavy machinery 09/21/45  Yes Wilhemena Harbour, NP  empagliflozin (JARDIANCE) 10 MG TABS tablet Take 1 tablet (10 mg total) by mouth daily before breakfast. 01/30/22  Yes [name], [name], DO  losartan (COZAAR) 25 MG tablet Take 1 tablet (25 mg total) by mouth daily. 10/04/22 09/29/23 Yes [name], MD  metoprolol succinate (TOPROL-XL) 25 MG 24 hr tablet Take 1 tablet (25 mg total) by mouth daily. Take with or immediately following a meal. 01/11/22 05/06/23 Yes Memon, Hermenia Loose, MD  midodrine (PROAMATINE) 2.5 MG tablet Take 1 tablet (2.5 mg total) by mouth 2 (two) times daily as needed with a meal for Low BP 08/23/22  Yes Lasalle Pointer, NP  potassium chloride SA (KLOR-CON M) 20 MEQ tablet Take 20 mEq by mouth 2 (two) times daily. 03/13/22  Yes [provider]  spironolactone (ALDACTONE) 25 MG tablet Take 1 tablet (25 mg total) by mouth daily. 03/11/22  Yes [name], [name], DO  torsemide (DEMADEX) 20 MG tablet Take 2 tablets (40  mg total) by mouth daily. 12/30/22  Yes BranchJoyceann No, MD  warfarin (COUMADIN) 10 MG tablet TAKE 1/2 TO 1 TABLET BY MOUTH DAILY AS DIRECTED. MUST COME TO APPOINTMENT FOR REFILLS Patient taking differently: Take 10 mg by mouth one time only at 4 PM. TAKE 1/2 TO 1 TABLET BY MOUTH DAILY AS DIRECTED. MUST COME TO APPOINTMENT FOR REFILLS Wednesdays and Saturdays 1.5 tabs, rest of the week is 1 tab 04/29/23  Yes Branch, Joyceann No, MD     Critical care time: 42 minutes     Roz Cornelia, AGACNP-BC [facility]  See Amion for personal pager PCCM on call pager [phone] until 7pm. Please call Elink 7p-7a. [phone]  05/07/2023 9:18 PM

## 2023-05-07 NOTE — Progress Notes (Addendum)
 Rapid Response  Rapid Response was called due to patient complaining of chest tightness with increased shortness of breath.  EKG done breath STEMI.  However, on personal review of EKG, no ST elevation was noted and there was no marked difference from EKG on admission.  Patient's chest pain was tender to palpation and appears to be pleuritic in nature. She has a history of CHF.  Prior echo done on 07/03/2022 was reviewed and showed LVEF of 25 to 30%. +RWMA, G3 DD, elevated LAP. It was decided for patient to be taken to stepdown unit for closer monitoring.  Echocardiogram was already ordered to be done in the morning.  Cardiology will be consulted.  She was encouraged to go back on CPAP.  Tylenol for pleuritic chest pain.  Continue to monitor patient and treat accordingly.   BP 120/71   Pulse 87   Temp (!) 97.4 F (36.3 C) (Oral)   Resp 20   Ht 5\' 10"  (1.778 m)   Wt (!) 138.3 kg   LMP 03/25/2023   SpO2 100%   BMI 43.73 kg/m   EKG personally reviewed: Normal sinus rhythm at a rate of 91 bpm with RBBB and frequent PVCs  Assessment and plan  Pleuritic chest pain Chest pain worsens with cough Continue Tylenol, Robitussin  Acute on chronic combined systolic and diastolic CHF  Echocardiogram will be done in the morning Cardiology to be consulted Refer to admission H&P for further management  Critical time: 35 minutes   Critical care personally provided  managing the patient due to high probability of clinically significant and life threatening deterioration. This critical care time included obtaining a history; examining the patient, pulse oximetry; ordering and review of studies; arranging urgent treatment with development of a management plan; evaluation of patient's response of treatment; frequent reassessment; and discussions with other providers.  This critical care time was performed to assess and manage the high probability of imminent and life threatening deterioration that could  result in multi-organ failure.  Please refer to admission H&P for details regarding the care of this patient

## 2023-05-07 NOTE — Progress Notes (Signed)
*  PRELIMINARY RESULTS* Echocardiogram 2D Echocardiogram has been performed with Definity.  [name] [name] 05/07/2023, 2:20 PM

## 2023-05-07 NOTE — Consult Note (Signed)
 Cardiology Consultation   Patient ID: [name] [name] MRN: [id number]; DOB: [birth year]  Admit date: 05/06/2023 Date of Consult: 05/07/2023  PCP:  Rica Records, FNP   [facility] Providers Cardiologist:  Dina Rich, MD  Electrophysiologist:  Maurice Small, MD  {  Patient Profile:   [name] [name] is a 48 y.o. female with a hx of HFrEF (cath 05/2021 showing 40-50% proximal RCA stenosis but otherwise normal cors, EF at 45-50% in 05/2021, 30-35% in 08/2021 and 09/2021, 35-40% in 01/2022, 30 to 35% in 02/2022, and most recent EF 25 -30% in 06/2022), moderate to severe MR (severe by TEE in 02/2022), history of recurrent PE/DVT, HTN, NSVT and OSA (on CPAP) who is being seen 05/07/2023 for the evaluation of acute heart failure at the request of Dr. Arbutus Leas.  History of Present Illness:   [name] [name] with the above medical history has frequent admissions for CHF exacerbations. Last seen in OV in 12/2022 with Dr. Wyline Mood.  At that time, recent fluid overload improved with her increasing Lasix to 100 mg in a.m..  Lasix was changed to torsemide 40 mg daily.  Continued on daily Jardiance 10 Mg, losartan 25 mg, Toprol 25 mg, spironolactone 25 mg, warfarin 10 mg, atorvastatin 20 mg, and midodrine 2.5 mg BID prn  Presented to ED with concerns of increased dyspnea and bilateral LE edema up to her thighs x 2 days.  Labs notable for NA 132 , K 3.6, Cr 1.06, Mg 2.1, BNP 1671 (vs 12/31/22: 1296), hsTn 44>48, Hgb 9.2, WBC 3.9, lymphs 0.4, CBC differential revealed target cells and polychromasia.  Negative respiratory viral panel.  CXR showed cardiomegaly with pulmonary vascular congestion and possible mild interstitial edema. EKG: Sinus tach, HR 106, PVCs, poor R wave progression In the ED, treated with IV Lasix 80 mg x 1 and admitted to hospitalist for decompensated CHF.   On interview, reported worsening dyspnea, lower extremity edema, chest tightness, and n/v  x 4 to 5 days.  She noted  SOB with minimal exertion, orthopnea and PND, which was abnormal from her baseline. Reported intermittent chest tightness associated with exertion/cough/deep breaths, located midsternal, 9/10, lasting 5-6 mins, no radiation, non-positional, and worse than previous episodes.  Also noted poor urine output at home. She endorses overall medication compliance except missing doses this Monday and Tuesday.  Has been taking torsemide 40 mg daily as recommended. Reported drinking > 1L fluids per day and healthy diet.  Reported decrease in frequency of smoking, 2 to 3 cigarettes/day.  Stated baseline weight fluctuates between 262-270.  Has not been weighing herself at home regularly. Also noted diagnosed with flu approx 1 month ago at [facility], which was treated symptomatically. She was unable to follow-up with the HF clinic in regards to LVAD due to no phone service and lack of transportation.  During this hospitalization, patient noted some improvement in SOB but denies any changes in chest tightness, edema, and n/v.  Past Medical History:  Diagnosis Date   Anemia    Blood transfusion without reported diagnosis    CAD (coronary artery disease)    Nonobstructive   Closed left ankle fracture    August 30 2012   Clotting disorder (HCC)    DVT (deep venous thrombosis) (HCC)    L leg   Fibroid tumor    HFrEF (heart failure with reduced ejection fraction) (HCC)    Hidradenitis    Hypertension    Mitral regurgitation    Myocardial infarction (HCC)  NICM (nonischemic cardiomyopathy) (HCC)    NSVT (nonsustained ventricular tachycardia) (HCC)    Obesity    OSA on CPAP    Pulmonary embolism (HCC) 04/2013   Sleep apnea     Past Surgical History:  Procedure Laterality Date   ABSCESS DRAINAGE Bilateral 01/10/2015   BREAST BIOPSY Right 02/01/2020   benign   COLONOSCOPY WITH PROPOFOL N/A 06/08/2018   Procedure: COLONOSCOPY WITH PROPOFOL;  Surgeon: Suzette Espy, MD;  Location: [facility];  Service:  Endoscopy;  Laterality: N/A;  2:30pm   CYST REMOVAL TRUNK     IVC FILTER PLACEMENT ([facility] HX)     LOWER EXTREMITY VENOGRAPHY N/A 02/07/2021   Procedure: LOWER EXTREMITY VENOGRAPHY;  Surgeon: Kayla Part, MD;  Location: [facility] INVASIVE CV LAB;  Service: Cardiovascular;  Laterality: N/A;   PERIPHERAL VASCULAR THROMBECTOMY N/A 02/07/2021   Procedure: PERIPHERAL VASCULAR THROMBECTOMY;  Surgeon: Kayla Part, MD;  Location: [facility] INVASIVE CV LAB;  Service: Cardiovascular;  Laterality: N/A;   RIGHT HEART CATH N/A 03/08/2022   Procedure: RIGHT HEART CATH;  Surgeon: Alwin Baars, DO;  Location: [facility] INVASIVE CV LAB;  Service: Cardiovascular;  Laterality: N/A;   RIGHT HEART CATH N/A 10/04/2022   Procedure: RIGHT HEART CATH;  Surgeon: Alwin Baars, DO;  Location: [facility] INVASIVE CV LAB;  Service: Cardiovascular;  Laterality: N/A;   RIGHT/LEFT HEART CATH AND CORONARY ANGIOGRAPHY N/A 06/19/2021   Procedure: RIGHT/LEFT HEART CATH AND CORONARY ANGIOGRAPHY;  Surgeon: Arty Binning, MD;  Location: [facility] INVASIVE CV LAB;  Service: Cardiovascular;  Laterality: N/A;   TEE WITHOUT CARDIOVERSION N/A 03/08/2022   Procedure: TRANSESOPHAGEAL ECHOCARDIOGRAM (TEE);  Surgeon: Alwin Baars, DO;  Location: [facility] ENDOSCOPY;  Service: Cardiovascular;  Laterality: N/A;     Home Medications:  Prior to Admission medications   Medication Sig Start Date End Date Taking? Authorizing Provider  albuterol (VENTOLIN HFA) 108 (90 Base) MCG/ACT inhaler Inhale 2 puffs into the lungs every 2 (two) hours as needed for wheezing or shortness of breath. 07/09/22  Yes Emokpae, Courage, MD  atorvastatin (LIPITOR) 20 MG tablet TAKE ONE TABLET BY MOUTH ONCE DAILY. 04/30/23  Yes Branch, Joyceann No, MD  benzonatate (TESSALON) 100 MG capsule Take 1 capsule (100 mg total) by mouth 3 (three) times daily as needed for cough. Do not take with alcohol or while operating or driving heavy machinery 4/0/98  Yes Wilhemena Harbour, NP  empagliflozin  (JARDIANCE) 10 MG TABS tablet Take 1 tablet (10 mg total) by mouth daily before breakfast. 01/30/22  Yes [name], [name], DO  losartan (COZAAR) 25 MG tablet Take 1 tablet (25 mg total) by mouth daily. 10/04/22 09/29/23 Yes [name], MD  metoprolol succinate (TOPROL-XL) 25 MG 24 hr tablet Take 1 tablet (25 mg total) by mouth daily. Take with or immediately following a meal. 01/11/22 05/06/23 Yes Memon, Hermenia Loose, MD  midodrine (PROAMATINE) 2.5 MG tablet Take 1 tablet (2.5 mg total) by mouth 2 (two) times daily as needed with a meal for Low BP 08/23/22  Yes Lasalle Pointer, NP  potassium chloride SA (KLOR-CON M) 20 MEQ tablet Take 20 mEq by mouth 2 (two) times daily. 03/13/22  Yes [provider]  spironolactone (ALDACTONE) 25 MG tablet Take 1 tablet (25 mg total) by mouth daily. 03/11/22  Yes [name], [name], DO  torsemide (DEMADEX) 20 MG tablet Take 2 tablets (40 mg total) by mouth daily. 12/30/22  Yes BranchJoyceann No, MD  warfarin (COUMADIN) 10 MG tablet TAKE 1/2 TO 1 TABLET BY  MOUTH DAILY AS DIRECTED. MUST COME TO APPOINTMENT FOR REFILLS Patient taking differently: Take 10 mg by mouth one time only at 4 PM. TAKE 1/2 TO 1 TABLET BY MOUTH DAILY AS DIRECTED. MUST COME TO APPOINTMENT FOR REFILLS Wednesdays and Saturdays 1.5 tabs, rest of the week is 1 tab 04/29/23  Yes Branch, Dorothe Pea, MD    Inpatient Medications: Scheduled Meds:  atorvastatin  20 mg Oral Daily   Chlorhexidine Gluconate Cloth  6 each Topical Q0600   furosemide  40 mg Intravenous Q12H   losartan  25 mg Oral Daily   metoprolol succinate  25 mg Oral Daily   Warfarin - Pharmacist Dosing Inpatient   Does not apply q1600   Continuous Infusions:  PRN Meds: acetaminophen **OR** acetaminophen, guaiFENesin-dextromethorphan, ipratropium-albuterol, ondansetron **OR** ondansetron (ZOFRAN) IV, polyethylene glycol  Allergies:    Allergies  Allergen Reactions   Sulfa Antibiotics Itching, Swelling and Rash    Lip swelling     Social History:   Social History   Socioeconomic History   Marital status: Widowed    Spouse name: Not on file   Number of children: Not on file   Years of education: Not on file   Highest education level: Some college, no degree  Occupational History   Not on file  Tobacco Use   Smoking status: Every Day    Types: Cigarettes    Passive exposure: Never   Smokeless tobacco: Never   Tobacco comments:    Smokes 2-3 cigarettes daily as of 02/13/22  Vaping Use   Vaping status: Never Used  Substance and Sexual Activity   Alcohol use: Yes    Alcohol/week: 2.0 standard drinks of alcohol    Types: 2 Cans of beer per week    Comment: twice a week   Drug use: No   Sexual activity: Not Currently    Birth control/protection: Pill, None  Other Topics Concern   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    Has a 47 year old Aviauna.   Live with parents.   Was working 5 days a weeks.   Not working right now.    Attends church.    Family History:   Family History  Problem Relation Age of Onset   Hypertension Mother    Diabetes Paternal Aunt    Diabetes Paternal Uncle    Other Father        blood clots   Cancer Maternal Grandmother    Heart attack Paternal Grandmother    Colon cancer Neg Hx      ROS:  Please see the history of present illness.  All other ROS reviewed and negative.     Physical Exam/Data:   Vitals:   05/06/23 2305 05/07/23 0017 05/07/23 0202 05/07/23 0426  BP:  120/71 (!) 110/94   Pulse: 93 87 89   Resp: 20  (!) 26   Temp:  98.3 F (36.8 C)  98.3 F (36.8 C)  TempSrc:  Oral  Oral  SpO2: 99% 100% 100% 96%  Weight:  (!) 139.6 kg    Height:  5\' 10"  (1.778 m)      Intake/Output Summary (Last 24 hours) at 05/07/2023 0730 Last data filed at 05/07/2023 0038 Gross per 24 hour  Intake 360 ml  Output --  Net 360 ml      05/07/2023   12:17 AM 05/06/2023    6:52 PM 05/06/2023  12:20 PM  Last 3  Weights  Weight (lbs) 307 lb 12.2 oz 304 lb 12.8 oz 299 lb  Weight (kg) 139.6 kg 138.256 kg 135.626 kg     Body mass index is 44.16 kg/m.  General:  Laying in bed, in no acute distress HEENT: normal Neck: +JVD Vascular: No carotid bruits; Distal pulses 2+ bilaterally Cardiac:  normal S1, S2; RRR; no murmur  Lungs:  crackles and rales throughout Abd: soft, nontender, no hepatomegaly  Ext: +2 edema up to thighs Musculoskeletal:  No deformities, BUE and BLE strength normal and equal Skin: warm and dry  Neuro:  CNs 2-12 intact, no focal abnormalities noted Psych:  Normal affect   EKG:  The EKG was personally reviewed and demonstrates:  Sinus tach, HR 106, PVCs, poor R wave progression Telemetry:  Telemetry was personally reviewed and demonstrates:  SR, HR 70-80's, with occasional PVC's  Relevant CV Studies: RHC 09/2022 IMPRESSION: 1.           Mildly elevated left ventricular filling pressure 2.           Normal right heart filling pressure 3.           Reduced cardiac index by assumed Fick method 4.           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 RECOMMENDATIONS: Given symptoms, recent CPX, will start LVAD workup Discussed with primary HF Dr. Gasper Lloyd  ECHO IMPRESSIONS 06/2022  1. Left ventricular ejection fraction, by estimation, is 25 to 30%. The  left ventricle has severely decreased function. The left ventricle  demonstrates regional wall motion abnormalities (see scoring  diagram/findings for description). The left  ventricular internal cavity size was severely dilated. Left ventricular  diastolic parameters are consistent with Grade III diastolic dysfunction  (restrictive). Elevated left atrial pressure.   2. Ventricular septum is flattened in systole suggesting RV pressure  overload. . Right ventricular systolic function is mildly reduced. The  right ventricular size is normal. There is moderately elevated pulmonary   artery systolic pressure.   3. Left atrial size was severely dilated.   4. Functional eccentric posterior directed MR difficult to quantify, at  least moderate possibly severe MR. MV/AV VTI ratio 1.3 suggesting moderate  to severe MR. The mitral valve is abnormal. Moderate to severe mitral  valve regurgitation. No evidence of  mitral stenosis.   5. The tricuspid valve is abnormal. Tricuspid valve regurgitation is  moderate to severe.   6. The aortic valve is tricuspid. There is mild calcification of the  aortic valve. There is mild thickening of the aortic valve. Aortic valve  regurgitation is not visualized. No aortic stenosis is present.   FINDINGS   Left Ventricle: Left ventricular ejection fraction, by estimation, is 25  to 30%. The left ventricle has severely decreased function. The left  ventricle demonstrates regional wall motion abnormalities. Definity  contrast agent was given IV to delineate  the left ventricular endocardial borders. The left ventricular internal  cavity size was severely dilated. There is no left ventricular  hypertrophy. Left ventricular diastolic parameters are consistent with  Grade III diastolic dysfunction (restrictive).  Elevated left atrial pressure.   05/2021 R/LHC CONCLUSIONS: ? 40 to 50% proximal right coronary narrowing. ? Right dominant anatomy ? Coronary arteries otherwise normal ? Significant elevation LVEDP 30 mmHg consistent with acute on chronic combined systolic and diastolic heart failure ? Mild pulmonary hypertension, mean pressure 30 mmHg.  WHO group II etiology based on hemodysnamics.Marland Kitchen ?  Capillary wedge mean 24 mmHg.  V wave to 40 mmHg suggesting some degree of mitral regurgitation. ? Cardiac output 8.5 L/min with index 3.57 L/min/m. ? Pulmonary resistance 0.7 Wood units.   RECOMMENDATIONS: ? Care with IV fluid administration. ? Needs diuresis. ? Start heart failure therapy including ARNI and SGLT2 as tolerated.  Laboratory  Data:  High Sensitivity Troponin:   Recent Labs  Lab 05/06/23 1329 05/06/23 1603  TROPONINIHS 44* 48*     Chemistry Recent Labs  Lab 05/06/23 1329 05/06/23 1603 05/07/23 0028  NA 132*  --  132*  K 3.6  --  3.8  CL 93*  --  98  CO2 27  --  28  GLUCOSE 101*  --  79  BUN 26*  --  31*  CREATININE 1.06*  --  1.24*  CALCIUM 9.2  --  9.1  MG  --  2.1  --   GFRNONAA >60  --  54*  ANIONGAP 12  --  6    No results for input(s): "PROT", "ALBUMIN", "AST", "ALT", "ALKPHOS", "BILITOT" in the last 168 hours. Lipids No results for input(s): "CHOL", "TRIG", "HDL", "LABVLDL", "LDLCALC", "CHOLHDL" in the last 168 hours.  Hematology Recent Labs  Lab 05/06/23 1329  WBC 3.9*  RBC 4.38  HGB 9.2*  HCT 33.6*  MCV 76.7*  MCH 21.0*  MCHC 27.4*  RDW 23.6*  PLT 186   Thyroid No results for input(s): "TSH", "FREET4" in the last 168 hours.  BNP Recent Labs  Lab 05/06/23 1603  BNP 1,671.0*    DDimer No results for input(s): "DDIMER" in the last 168 hours.   Radiology/Studies:  DG Chest 2 View Result Date: 05/06/2023 CLINICAL DATA:  Shortness of breath. EXAM: CHEST - 2 VIEW COMPARISON:  Chest radiograph dated 07/07/2022. FINDINGS: Cardiomegaly. Pulmonary vascular congestion with possible mild interstitial edema. No focal consolidation, pleural effusion, or pneumothorax. No acute osseous abnormality. IMPRESSION: Cardiomegaly with pulmonary vascular congestion and possible mild interstitial edema. Electronically Signed   By: Hart Robinsons M.D.   On: 05/06/2023 15:34     Assessment and Plan:   Acute on chronic HFrEF Anasarca   - Echo 06/2022: LVEF 25 to 30%, severely decreased LV function, severely dilated LV, + RWMA (lateral, inferior, apical anterior, and apex are akinetic), G3DD, elevated LA pressure, RV pressure overload, mildly reduced RV systolic function, moderately elevated PASP, severely dilated LA, moderate-severe MVR -RHC 09/2022: Mildly elevated LV filling pressures, reduced  EF, moderate pulmonary hypertension.  Recommended LVAD workup. However per chart review, patient contacted multiple times and has failed to follow through.  - this admission, reported worsening SOB with minimal exertion, LE edema, orthopnea and PND x 4-5 day, which is abnormal from her baseline. BNP 1671. With IV lasix, only noted mild improvement in SOB and reported poor urine output. - I/O not documented, suspect weight discrepancy in document 299>307. Stated baseline weight fluctuates between 262-270. Last weight in office 12/2022: 256 lbs. Cr 1.24 today (baseline 1.0-1.1).  - Appear volume overload on exam. Increase Lasix 40 mg BID to 60 mg BID. Could consider lasix drip depending response to dose increase.  - Currently Toprol 25 mg every day. Decreased Losartan to 12.5 mg due soft BP.  - Ordered daily Jardiance 10 mg. - Can consider adding spironolactone back as BP allows.  - BP does not allow for Entresto at this time.  - ECHO pending   CAD  Reported intermittent chest tightness x 4-5 days associated with exertion/cough/breaths, located midsternal,  9/10, lasting 5-6 mins, no radiation, non-positional, and worse than previous episodes. Also n/v. This am, CP present and also vomiting.  Tn 44 > 48 - LHC 05/2021: 40 to 50% proximal RCA, normal coronary arteries - Less likely ischemic in setting of flat troponin and no ischemic EKG changes. Will recheck troponin for trend this am.  - Continue atorvastatin 20 mg and Toprol 25 mg. Not on ASA due to coumadin.  - Lipid panel ordered for am labs  HTN  - BP this am:  103/81 - Decrease Losartan to 12.5 mg due to soft BP   Pulmonary Hypertension  - 09/2022 RHC: mean PA 33, PCWP 21, CI 2.04  - Need to reestablish with heart failure  - Diuresis as above  Severe MR  - ECHO 06/2022: moderate-severe MVR - Per heart failure note 08/2022, she was an optimal candidate for mitral TEER in 02/2022 when initially assessed, however now requiring advanced  therapies.  Due to continuing to smoke she was omitted from her transplant evaluation. Never followed up with structural heart.  - ECHO pending   History of PE/DVT with IVC filter - 04/27/2013: Submassive saddle PE extending from the distal main pulmonary artery and to the left/right pulmonary arteries with extensions into the left upper lobe, left lower lobe and lingula branches, right upper lobe, right middle lobe and right lower lobe.  - 2015: IVC filter placed that could not be retrieved due to extensive thrombus burden - Hematologist favor Coumadin over DOAC's for her historically.  - INR 1.9, dosing per pharmacy - Continue on Coumadin  Risk Assessment/Risk Scores:   [place]  Heart Association (NYHA) Functional Class NYHA Class III  For questions or updates, please contact [facility] Please consult [email or web address] for contact info under    Signed, Metta Actis, PA-C  05/07/2023 7:30 AM

## 2023-05-07 NOTE — Progress Notes (Signed)
 eLink Physician-Brief Progress Note Patient Name: [name] [name] DOB: 04-Nov-1975 MRN: [id number]   Date of Service  05/07/2023  HPI/Events of Note  48 y.o. female with medical history significant for systolic CHF, hypertension, DVT and PE on chronic anticoagulation with warfarin, IVC status, OSA on CPAP presented to the ED with complaints of increasing difficulty breathing, and swelling to her bilateral lower extremities up to her thighs that started 2 days ago found to be in cardiogenic shock requiring inotropic support transferred to [facility] for further evaluation.  On examination, mildly tachypneic but saturating 94% on 2 L of oxygen.  Currently running dobutamine and norepinephrine, milrinone has been discontinued.  Results show creatinine elevation, microcytic anemia, INR elevation.  Echocardiogram with EF 25-30%.  Chest radiograph with interstitial edema and fluid overload  eICU Interventions  Transition from milrinone to dobutamine  Heparin for PE.  IVC filter in place.  BiPAP in place  Holding diuretics for the time being  Follow SVC O2.  May need TEE confirming severe MR.  Not a great candidate for bad or transplant  DVT prophylaxis with therapeutic heparin GI prophylaxis with pantoprazole     Intervention Category Evaluation Type: New Patient Evaluation  [name] [name] 05/07/2023, 9:50 PM

## 2023-05-07 NOTE — Plan of Care (Signed)
   Progress Note  Patient Name: [name] [name] Date of Encounter: 05/07/2023  Primary Cardiologist: Armida Lander, MD   Noted to be hypotensive (as low as 60s) on lasix drip 12 mg/hr. Repeat BP check noted 80 mm Hg SBP. Lasix drip discontinued. Levophed started. Central line will be in place soon. Nauseous since admission.  Vitals:   05/07/23 1710 05/07/23 1720  BP: (!) 93/43 (!) 81/41  Pulse:    Resp: (!) 22 (!) 23  Temp:    SpO2:      Assessment and Plan  ADHF Stage D / Acute on chronic systolic HF / NICM LVEF 25-30% with no device, decompensated Severe MR  -Hypotensive on lasix drip. Start Milrinone 0.375, check Co-ox 2 hours after starting milrinone (from central line). Transduce one of the central line ports to CVP. She is grossly volume overloaded with 60 lb weight gain. Resume lasix drip at 10 mg/hr when MAP>65. Transfer to [facility] for AHF consultation. Keep K>4 and <5, Mg>2 and <3.   Signed, Lasalle Pointer, MD  05/07/2023, 5:41 PM

## 2023-05-07 NOTE — Progress Notes (Signed)
 Patient placed back on CPAP after being transferred to ICU.

## 2023-05-07 NOTE — Plan of Care (Signed)
  Problem: Education: Goal: Knowledge of General Education information will improve Description: Including pain rating scale, medication(s)/side effects and non-pharmacologic comfort measures Outcome: Progressing   Problem: Health Behavior/Discharge Planning: Goal: Ability to manage health-related needs will improve Outcome: Progressing   Problem: Clinical Measurements: Goal: Ability to maintain clinical measurements within normal limits will improve Outcome: Progressing Goal: Will remain free from infection Outcome: Progressing Goal: Diagnostic test results will improve Outcome: Progressing Goal: Respiratory complications will improve Outcome: Progressing   Problem: Nutrition: Goal: Adequate nutrition will be maintained Outcome: Progressing   Problem: Coping: Goal: Level of anxiety will decrease Outcome: Progressing   Problem: Elimination: Goal: Will not experience complications related to bowel motility Outcome: Progressing Goal: Will not experience complications related to urinary retention Outcome: Progressing   Problem: Pain Managment: Goal: General experience of comfort will improve and/or be controlled Outcome: Progressing   Problem: Safety: Goal: Ability to remain free from injury will improve Outcome: Progressing   Problem: Skin Integrity: Goal: Risk for impaired skin integrity will decrease Outcome: Progressing   Problem: Education: Goal: Ability to demonstrate management of disease process will improve Outcome: Progressing Goal: Ability to verbalize understanding of medication therapies will improve Outcome: Progressing Goal: Individualized Educational Video(s) Outcome: Progressing   Problem: Activity: Goal: Capacity to carry out activities will improve Outcome: Progressing   Problem: Cardiac: Goal: Ability to achieve and maintain adequate cardiopulmonary perfusion will improve Outcome: Progressing

## 2023-05-07 NOTE — Op Note (Signed)
 Patient:  [name] [name]  DOB:  1975-10-17  MRN:  [id number]   Preop Diagnosis: Need for central venous access, congestive heart failure  Postop Diagnosis: Same  Procedure: Central line placement  Surgeon: Alanda Allegra, MD  Anes: Local  Indications: Patient is a 48 year old black female with acute on chronic congestive heart failure who is in need of central venous access for multiple IV medications.  As her INR is 2, a femoral line was placed.  Risks and benefits of the procedure were fully explained to the patient, who gave informed consent.  Procedure note: The procedure was performed in ICU bed 10.  Surgical site confirmation was performed.  1% Xylocaine was used for local anesthesia.  Complete sterile technique was used throughout the procedure.  The right femoral vein was accessed using the Seldinger technique without difficulty.  The guidewire was then advanced.  An introducer was placed over the guidewire.  A 20-gauge triple-lumen catheter was then placed and the guidewire was removed.  Good backflow of venous blood was noted on aspiration of all 3 ports.  All 3 ports were flushed with saline.  It was secured in the place using 3-0 silk sutures.  A dry sterile dressing was applied.  The patient tolerated the procedure well.  Complications: None  EBL: Minimal  Specimen: None

## 2023-05-07 NOTE — Progress Notes (Signed)
 Report called to [facility] @[facility]/ Currently on levo and milrinone. Pt AOX4 , NSR with PVCs on monitor.

## 2023-05-07 NOTE — Progress Notes (Signed)
 Patient being transported to [facility] 2H via stretcher by [facility].  Foley inserted for transport and let [facility] know.  Patient alert and oriented to person, place, time and situation.

## 2023-05-07 NOTE — Consult Note (Addendum)
 Advanced Heart Failure Team Consult Note   Primary Physician: Rica Records, FNP Cardiologist:  Dina Rich, MD  Reason for Consultation: Cardiogenic shock  HPI:    [name] [name] is seen today for evaluation of cardiogenic shock at the request of Dr. Jenene Slicker.   [name] [name] is a 48 y.o. 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   Has been seen by Dr. Gasper Lloyd in past and referred for mTEER as well as LVAD evalaution but did not f/u.   In 5/23 she was admitted with acute hypoxic respiratory failure requiring intubation. LHC during that admit w/ nonobstructive CAD. She was admitted again in 9/23 with syncope, nausea and C2 fracture managed conservatively. LVEF during that admit noted to be 35% w/ severe posterior MR.   Admitted to [facility] with progressive HF symptoms. Found to be in cardiogenic shock with lactic acid 2.7  Femoral central line placed Started on milrinone 0.375 and NE 9   Says her breathing is better but still feels weak. No CP.   Echo 4/25 EF 20-25% severe RV dysfunction with septal flattening Mod-severe MR    Home Medications Prior to Admission medications   Medication Sig Start Date End Date Taking? Authorizing Provider  albuterol (VENTOLIN HFA) 108 (90 Base) MCG/ACT inhaler Inhale 2 puffs into the lungs every 2 (two) hours as needed for wheezing or shortness of breath. 07/09/22  Yes Emokpae, Courage, MD  atorvastatin (LIPITOR) 20 MG tablet TAKE ONE TABLET BY MOUTH ONCE DAILY. 04/30/23  Yes Branch, Dorothe Pea, MD  benzonatate (TESSALON) 100 MG capsule Take 1 capsule (100 mg total) by mouth 3 (three) times daily as needed for cough. Do not take with alcohol or while operating or driving heavy machinery 01/27/08  Yes Valentino Nose, NP  empagliflozin (JARDIANCE) 10 MG TABS tablet  Take 1 tablet (10 mg total) by mouth daily before breakfast. 01/30/22  Yes [name], [name], DO  losartan (COZAAR) 25 MG tablet Take 1 tablet (25 mg total) by mouth daily. 10/04/22 09/29/23 Yes Romie Minus, MD  metoprolol succinate (TOPROL-XL) 25 MG 24 hr tablet Take 1 tablet (25 mg total) by mouth daily. Take with or immediately following a meal. 01/11/22 05/06/23 Yes Memon, Durward Mallard, MD  midodrine (PROAMATINE) 2.5 MG tablet Take 1 tablet (2.5 mg total) by mouth 2 (two) times daily as needed with a meal for Low BP 08/23/22  Yes Sharlene Dory, NP  potassium chloride SA (KLOR-CON M) 20 MEQ tablet Take 20 mEq by mouth 2 (two) times daily. 03/13/22  Yes [provider]  spironolactone (ALDACTONE) 25 MG tablet Take 1 tablet (25 mg total) by mouth daily. 03/11/22  Yes [name], [name], DO  torsemide (DEMADEX) 20 MG tablet Take 2 tablets (40 mg total) by mouth daily. 12/30/22  Yes BranchDorothe Pea, MD  warfarin (COUMADIN) 10 MG tablet TAKE 1/2 TO 1 TABLET BY MOUTH DAILY AS DIRECTED. MUST COME TO APPOINTMENT FOR REFILLS Patient taking differently: Take 10 mg by mouth one time only at 4 PM. TAKE 1/2 TO 1 TABLET BY MOUTH DAILY AS DIRECTED. MUST COME TO APPOINTMENT FOR REFILLS Wednesdays and Saturdays 1.5 tabs, rest of the week is 1 tab 04/29/23  Yes Branch, Dorothe Pea, MD    Past Medical History: Past Medical History:  Diagnosis Date   Anemia    Blood transfusion  without reported diagnosis    CAD (coronary artery disease)    Nonobstructive   Closed left ankle fracture    August 30 2012   Clotting disorder [facility])    DVT (deep venous thrombosis) (HCC)    L leg   Fibroid tumor    HFrEF (heart failure with reduced ejection fraction) (HCC)    Hidradenitis    Hypertension    Mitral regurgitation    Myocardial infarction [facility])    NICM (nonischemic cardiomyopathy) (HCC)    NSVT (nonsustained ventricular tachycardia) (HCC)    Obesity    OSA on CPAP    Pulmonary embolism (HCC) 04/2013    Sleep apnea     Past Surgical History: Past Surgical History:  Procedure Laterality Date   ABSCESS DRAINAGE Bilateral 01/10/2015   BREAST BIOPSY Right 02/01/2020   benign   COLONOSCOPY WITH PROPOFOL N/A 06/08/2018   Procedure: COLONOSCOPY WITH PROPOFOL;  Surgeon: Corbin Ade, MD;  Location: [facility]ITE;  Service: Endoscopy;  Laterality: N/A;  2:30pm   CYST REMOVAL TRUNK     IVC FILTER PLACEMENT ([facility] HX)     LOWER EXTREMITY VENOGRAPHY N/A 02/07/2021   Procedure: LOWER EXTREMITY VENOGRAPHY;  Surgeon: Victorino Sparrow, MD;  Location: [facility] INVASIVE CV LAB;  Service: Cardiovascular;  Laterality: N/A;   PERIPHERAL VASCULAR THROMBECTOMY N/A 02/07/2021   Procedure: PERIPHERAL VASCULAR THROMBECTOMY;  Surgeon: Victorino Sparrow, MD;  Location: [facility] INVASIVE CV LAB;  Service: Cardiovascular;  Laterality: N/A;   RIGHT HEART CATH N/A 03/08/2022   Procedure: RIGHT HEART CATH;  Surgeon: Dorthula Nettles, DO;  Location: [facility] INVASIVE CV LAB;  Service: Cardiovascular;  Laterality: N/A;   RIGHT HEART CATH N/A 10/04/2022   Procedure: RIGHT HEART CATH;  Surgeon: Dorthula Nettles, DO;  Location: [facility] INVASIVE CV LAB;  Service: Cardiovascular;  Laterality: N/A;   RIGHT/LEFT HEART CATH AND CORONARY ANGIOGRAPHY N/A 06/19/2021   Procedure: RIGHT/LEFT HEART CATH AND CORONARY ANGIOGRAPHY;  Surgeon: Lyn Records, MD;  Location: [facility] INVASIVE CV LAB;  Service: Cardiovascular;  Laterality: N/A;   TEE WITHOUT CARDIOVERSION N/A 03/08/2022   Procedure: TRANSESOPHAGEAL ECHOCARDIOGRAM (TEE);  Surgeon: Dorthula Nettles, DO;  Location: [facility] ENDOSCOPY;  Service: Cardiovascular;  Laterality: N/A;    Family History: Family History  Problem Relation Age of Onset   Hypertension Mother    Diabetes Paternal Aunt    Diabetes Paternal Uncle    Other Father        blood clots   Cancer Maternal Grandmother    Heart attack Paternal Grandmother    Colon cancer Neg Hx     Social History: Social History   Socioeconomic History    Marital status: Widowed    Spouse name: Not on file   Number of children: Not on file   Years of education: Not on file   Highest education level: Some college, no degree  Occupational History   Not on file  Tobacco Use   Smoking status: Every Day    Types: Cigarettes    Passive exposure: Never   Smokeless tobacco: Never   Tobacco comments:    Smokes 2-3 cigarettes daily as of 02/13/22  Vaping Use   Vaping status: Never Used  Substance and Sexual Activity   Alcohol use: Yes    Alcohol/week: 2.0 standard drinks of alcohol    Types: 2 Cans of beer per week    Comment: twice a week   Drug use: No   Sexual activity: Not Currently    Birth control/protection:  Pill, None  Other Topics Concern   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    Has a 47 year old Aviauna.   Live with parents.   Was working 5 days a weeks.   Not working right now.    Attends church.    Social Drivers of Corporate investment banker Strain: Low Risk  (04/03/2023)   Overall Financial Resource Strain (CARDIA)    Difficulty of Paying Living Expenses: Not very hard  Food Insecurity: No Food Insecurity (05/06/2023)   Hunger Vital Sign    Worried About Running Out of Food in the Last Year: Never true    Ran Out of Food in the Last Year: Never true  Transportation Needs: No Transportation Needs (05/06/2023)   PRAPARE - Administrator, Civil Service (Medical): No    Lack of Transportation (Non-Medical): No  Physical Activity: Insufficiently Active (04/03/2023)   Exercise Vital Sign    Days of Exercise per Week: 2 days    Minutes of Exercise per Session: 10 min  Stress: No Stress Concern Present (04/03/2023)   [employer] of Occupational Health - Occupational Stress Questionnaire    Feeling of Stress : Only a little  Social Connections: Socially Isolated (04/03/2023)   Social Connection and Isolation Panel [NHANES]    Frequency of  Communication with Friends and Family: Twice a week    Frequency of Social Gatherings with Friends and Family: Twice a week    Attends Religious Services: Never    Database administrator or Organizations: No    Attends Banker Meetings: Never    Marital Status: Widowed    Allergies:  Allergies  Allergen Reactions   Sulfa Antibiotics Itching, Swelling and Rash    Lip swelling    Objective:    Vital Signs:   Temp:  [97.4 F (36.3 C)-98.5 F (36.9 C)] 97.8 F (36.6 C) (04/16 1635) Pulse Rate:  [29-93] 29 (04/16 1835) Resp:  [14-27] 15 (04/16 1835) BP: (59-120)/(11-94) 114/93 (04/16 1835) SpO2:  [67 %-100 %] 88 % (04/16 1835) Weight:  [139.6 kg] 139.6 kg (04/16 0017) Last BM Date : 05/06/23  Weight change: Filed Weights   05/06/23 1220 05/06/23 1852 05/07/23 0017  Weight: 135.6 kg (!) 138.3 kg (!) 139.6 kg    Intake/Output:   Intake/Output Summary (Last 24 hours) at 05/07/2023 1907 Last data filed at 05/07/2023 1831 Gross per 24 hour  Intake 796.21 ml  Output 250 ml  Net 546.21 ml      Physical Exam    General:  Obese woman sitting up in bed.Mild SOB HEENT: normal Neck: supple. JVP to ear Carotids 2+ bilat; no bruits. No lymphadenopathy or thyromegaly appreciated. Cor: Regular rate & rhythm+ s3 2/6 MR Lungs: clear Abdomen: markedly obese  soft, nontender, nondistended. No hepatosplenomegaly. No bruits or masses. Good bowel sounds. Extremities: no cyanosis, clubbing, rash, 3+ edema (LLE lymphendema)  cool Neuro: alert & orientedx3, cranial nerves grossly intact. moves all 4 extremities w/o difficulty. Affect pleasant   Telemetry   Sinus 70-80s  + PVCS Personally reviewed  EKG    Sinus 91 with IVCD and frequent PVCs Personally reviewed  Labs   Basic Metabolic Panel: Recent Labs  Lab 05/06/23 1329 05/06/23 1603 05/07/23 0028  NA 132*  --  132*  K 3.6  --  3.8  CL 93*  --  98  CO2 27  --  28  GLUCOSE 101*  --  79  BUN 26*  --  31*   CREATININE 1.06*  --  1.24*  CALCIUM 9.2  --  9.1  MG  --  2.1  --     Liver Function Tests: Recent Labs  Lab 05/07/23 1102  AST 35  ALT 27  ALKPHOS 122  BILITOT 2.3*  PROT 8.3*  ALBUMIN 3.1*   No results for input(s): "LIPASE", "AMYLASE" in the last 168 hours. No results for input(s): "AMMONIA" in the last 168 hours.  CBC: Recent Labs  Lab 05/06/23 1329 05/07/23 1701  WBC 3.9* 4.8  NEUTROABS 2.9  --   HGB 9.2* 8.7*  HCT 33.6* 32.1*  MCV 76.7* 79.1*  PLT 186 169    Cardiac Enzymes: No results for input(s): "CKTOTAL", "CKMB", "CKMBINDEX", "TROPONINI" in the last 168 hours.  BNP: BNP (last 3 results) Recent Labs    09/16/22 1145 12/31/22 0904 05/06/23 1603  BNP 1,157.1* 1,296.0* 1,671.0*    ProBNP (last 3 results) No results for input(s): "PROBNP" in the last 8760 hours.   CBG: No results for input(s): "GLUCAP" in the last 168 hours.  Coagulation Studies: Recent Labs    05/06/23 1603 05/07/23 0028  LABPROT 23.1* 22.4*  INR 2.0* 1.9*     Imaging   [place] EKG SITE RITE Result Date: 05/07/2023 If Site Rite image not attached, placement could not be confirmed due to current cardiac rhythm.  ECHOCARDIOGRAM COMPLETE Result Date: 05/07/2023    ECHOCARDIOGRAM REPORT   Patient Name:   [name] [name] Date of Exam: 05/07/2023 Medical Rec #:  [id number]       Height:       70.0 in Accession #:    [id number]      Weight:       307.8 lb Date of Birth:  28-Mar-1975       BSA:          2.507 m Patient Age:    47 years        BP:           99/72 mmHg Patient Gender: F               HR:           67 bpm. [facility] Procedure: 2D Echo, Cardiac Doppler, Color Doppler and Intracardiac            Opacification Agent (Both Spectral and Color Flow Doppler were            utilized during procedure). Indications:    CHF l50.9  History:        Patient has prior history of Echocardiogram examinations, most                 recent 07/03/2022. CHF and Cardiomyopathy, CAD  and Previous                 Myocardial Infarction; Risk Factors:Hypertension and Sleep                 Apnea. OSA on CPAP, Tobacco use disorder, Obesity.  Sonographer:    Celesta Gentile RCS Referring Phys: [phone] Heloise Beecham EMOKPAE IMPRESSIONS  1. Left ventricular ejection fraction, by estimation, is 25 to 30%. The left ventricle has severely decreased function. The left ventricle demonstrates regional wall motion abnormalities (see scoring diagram/findings for description). The left ventricular internal cavity size was severely dilated. There is mild left ventricular hypertrophy. Left ventricular diastolic parameters  are consistent with Grade III diastolic dysfunction (restrictive).  2. Right ventricular systolic function is moderately reduced. The right ventricular size is mildly enlarged. There is normal pulmonary artery systolic pressure. The estimated right ventricular systolic pressure is 33.7 mmHg.  3. Left atrial size was severely dilated.  4. Right atrial size was mild to moderately dilated.  5. The mitral valve is degenerative. Moderate, posteriorly directed mitral valve regurgitation. Regurgitant volume 42 ml by PISA.  6. The aortic valve is tricuspid. Aortic valve regurgitation is not visualized.  7. The inferior vena cava is dilated in size with <50% respiratory variability, suggesting right atrial pressure of 15 mmHg. Comparison(s): Prior images reviewed side by side. Stable LVEF as 25-30% with wall motion abnormalities consistent with ischemic cardiomyopathy. Moderate mitral regurgitation. FINDINGS  Left Ventricle: Left ventricular ejection fraction, by estimation, is 25 to 30%. The left ventricle has severely decreased function. The left ventricle demonstrates regional wall motion abnormalities. The left ventricular internal cavity size was severely dilated. There is mild left ventricular hypertrophy. Left ventricular diastolic parameters are consistent with Grade III diastolic dysfunction  (restrictive).  LV Wall Scoring: The entire lateral wall, entire inferior wall, and apex are akinetic. The entire anterior wall, mid and distal anterior septum, and inferior septum are hypokinetic. The basal anteroseptal segment is normal. Right Ventricle: The right ventricular size is mildly enlarged. No increase in right ventricular wall thickness. Right ventricular systolic function is moderately reduced. There is normal pulmonary artery systolic pressure. The tricuspid regurgitant velocity is 2.16 m/s, and with an assumed right atrial pressure of 15 mmHg, the estimated right ventricular systolic pressure is 33.7 mmHg. Left Atrium: Left atrial size was severely dilated. Right Atrium: Right atrial size was mild to moderately dilated. Pericardium: Trivial pericardial effusion is present. The pericardial effusion is posterior to the left ventricle. Mitral Valve: The mitral valve is degenerative in appearance. There is mild thickening of the mitral valve leaflet(s). Moderate mitral valve regurgitation, with posteriorly-directed jet. Tricuspid Valve: The tricuspid valve is grossly normal. Tricuspid valve regurgitation is mild. Aortic Valve: The aortic valve is tricuspid. There is mild aortic valve annular calcification. Aortic valve regurgitation is not visualized. Pulmonic Valve: The pulmonic valve was grossly normal. Pulmonic valve regurgitation is trivial. Aorta: The aortic root is normal in size and structure. Venous: The inferior vena cava is dilated in size with less than 50% respiratory variability, suggesting right atrial pressure of 15 mmHg. IAS/Shunts: No atrial level shunt detected by color flow Doppler. Additional Comments: 3D was performed not requiring image post processing on an independent workstation and was indeterminate.  LEFT VENTRICLE PLAX 2D LVIDd:         7.70 cm   Diastology LVIDs:         6.00 cm   LV e' medial:    5.77 cm/s LV PW:         0.70 cm   LV E/e' medial:  22.2 LV IVS:        1.20 cm    LV e' lateral:   9.57 cm/s LVOT diam:     2.00 cm   LV E/e' lateral: 13.4 LV SV:         54 LV SV Index:   22 LVOT Area:     3.14 cm  RIGHT VENTRICLE RV S prime:     7.83 cm/s TAPSE (M-mode): 1.7 cm LEFT ATRIUM              Index  RIGHT ATRIUM           Index LA diam:        6.60 cm  2.63 cm/m   RA Area:     28.40 cm LA Vol (A2C):   180.0 ml 71.80 ml/m  RA Volume:   97.30 ml  38.81 ml/m LA Vol (A4C):   154.0 ml 61.43 ml/m LA Biplane Vol: 170.0 ml 67.81 ml/m  AORTIC VALVE LVOT Vmax:   89.40 cm/s LVOT Vmean:  65.200 cm/s LVOT VTI:    0.172 m  AORTA Ao Root diam: 3.10 cm MITRAL VALVE                  TRICUSPID VALVE MV Area (PHT): 5.66 cm       TR Peak grad:   18.7 mmHg MV Decel Time: 134 msec       TR Vmax:        216.00 cm/s MR Peak grad:    43.6 mmHg MR Mean grad:    28.0 mmHg    SHUNTS MR Vmax:         330.00 cm/s  Systemic VTI:  0.17 m MR Vmean:        250.0 cm/s   Systemic Diam: 2.00 cm MR PISA:         7.60 cm MR PISA Eff ROA: 49 mm MR PISA Radius:  1.10 cm MV E velocity: 128.00 cm/s MV A velocity: 35.40 cm/s MV E/A ratio:  3.62 Teddie Favre MD Electronically signed by Teddie Favre MD Signature Date/Time: 05/07/2023/2:46:34 PM    Final      Medications:     Current Medications:  atorvastatin  20 mg Oral Daily   Chlorhexidine Gluconate Cloth  6 each Topical Q0600   pantoprazole (PROTONIX) IV  40 mg Intravenous Q12H   sodium chloride flush  10-40 mL Intracatheter Q12H   Warfarin - Pharmacist Dosing Inpatient   Does not apply q1600    Infusions:  furosemide (LASIX) 200 mg in dextrose 5 % 100 mL (2 mg/mL) infusion Stopped (05/07/23 1600)   milrinone 0.375 mcg/kg/min (05/07/23 1838)   norepinephrine (LEVOPHED) Adult infusion 9 mcg/min (05/07/23 1831)      Assessment/Plan   Acute on chronic systolic HF with biventricualr dysfunction  -> cardiogenic shock - LHC in 2023 with nonobstructive CAD.  - RHC 9/24 RA 5 PA 53/21 (33) PCWP 21 Fick 4.6/2.0 TD 5.0/2.24 - Echo 4/25  EF 20-25% severe RV dysfunction with septal flattening Mod-severe MR - Now on milrinone 0.375 and NE 9 remains cool  - Will switch milrinone to DBA 5. Continue NE to maintain MAP >= 70 - Unable to get CVP/co-ox off femoral line. Place PICC - Body mass index is 44.16 kg/m. - Not ideal canddiate for VAD due to RV dysfunction. Not transplant candidate for transplant due to size and noncompliance - Start lasix gtt at 20 - UNNA boots  2. Severe MR - TEE confirming severe functional MR. Will discuss possibility for TEER with structural. - Current echo mod MR  3. AKI  - due to ATN shock - continue hemodynamic support  4. Hx of PE/DVT w/ IVC filter -Submassive saddle PE extending from the distal main pulmonary artery and to the left and right pulmonary arteries with extension into the left upper lobe, left lower lobe and lingula branches and right upper lobe, right middle lobe and right lower lobes on 04/27/2013  - Evaluated at [facility] where thrombophilia work-up was negative (Lupus inhibitor, anticardiolipin antibodies,  and anti beta-2 glycoprotein antibodies were negative; protein C activity 85%, protein S activity 76%, factor V Leiden, and prothrombin gene mutation ) - IVC filter in place; could not be removed due to significant thrombus burden on filter at [facility].  - Previously advised against DOACs due to severity of thrombus burden and weight >250lbs.  - Hold warfarin. Start heparin   5. Morbid obesity  - Body mass index is 44.16 kg/m. - consider GLP1RA  6. CAD - LHC in 5/23 w/ nonobstructive CAD - Followed by general cardiology - No s/s  angina  7. Microcytic anemia - check iron stores  D/w CCM at bedside  CRITICAL CARE Performed by: Savio Albrecht  Total critical care time: 55 minutes  Critical care time was exclusive of separately billable procedures and treating other patients.  Critical care was necessary to treat or prevent imminent or life-threatening  deterioration.  Critical care was time spent personally by me (independent of midlevel providers or residents) on the following activities: development of treatment plan with patient and/or surrogate as well as nursing, discussions with consultants, evaluation of patient's response to treatment, examination of patient, obtaining history from patient or surrogate, ordering and performing treatments and interventions, ordering and review of laboratory studies, ordering and review of radiographic studies, pulse oximetry and re-evaluation of patient's condition.  Length of Stay: 1  Jules Oar, MD  05/07/2023, 7:07 PM   Advanced Heart Failure Team Pager [phone] (M-F; 7a - 5p)  Please contact [facility] for night-coverage after hours (4p -7a ) and weekends on [email or web address]

## 2023-05-07 NOTE — Progress Notes (Addendum)
 PROGRESS NOTE  [name] [name]:[id number] DOB: 1975/05/08 DOA: 05/06/2023 PCP: Rica Records, FNP  Brief History:  48 year old female with a history of f DVT PE 2015 IVC filter prior on Xarelto --- recurrence DVT/PE January 2023 status post thrombectomy on lifelong anticoagulation with Coumadin secondary to weight >250 pounds, OSA on CPAP, Known uterine fibroids, tobacco use presenting with shortness of breath and worsening lower extremity edema up to her thighs of at least 2 days duration. She reports her baseline weight is about 274 pounds, she is weighing 299 here in the ED. She has had to sleep sitting up.   Review of the medical record shows that the patient had a weight of 244.9 pounds on 09/06/2022.  Review of the medical record shows that the patient was taking furosemide 80 mg twice daily at the time of her last cardiology follow-up on 09/16/2022.  However, she states that she was recently changed to torsemide.  In the ED, the patient was afebrile hemodynamically stable with oxygen saturation 96% room air.  WBC 3.9, hemoglobin 9.2, platelets 186.  Chest x-ray showed vascular congestion.  BMP shows sodium 132, potassium 3.6, serum creatinine 1.06, bicarbonate 27.  The patient was started on IV furosemide 40 mg IV twice daily.  Cardiology was consulted to assist with management.     Assessment/Plan: Acute on chronic Heart failure with reduced EF  -Nonischemic, LHC in 2023 with nonobstructive CAD.  -09/24/2021 echo EF 30-35%, mildly reduced RVF moderate to severe MR -02/16/22 Echo--EF 35-40%, low normal RVF, severe MR, mild-mod TR -07/03/2022 echo EF 25 to 30%, grade 3 DD, RV overload, moderate to severe MR -Continue IV furosemide -Accurate I's and O's -Continue metoprolol succinate, losartan -she does endorse indiscretion with fluid intake, not weighing herself at home -12/30/2022 office weight 256 pounds -Admission weight 305 pounds -Cardiology consult    DVT/PE (2015/2023) Chronic anticoagulation. -Submassive saddle PE on 04/27/2013  - Evaluated at [facility] where thrombophilia work-up was negative (Lupus inhibitor, anticardiolipin antibodies, and anti beta-2 glycoprotein antibodies were negative; protein C activity 85%, protein S activity 76%, factor V Leiden, and prothrombin gene mutation ) - IVC filter in place; could not be removed due to significant thrombus burden on filter at [facility].  - Previously advised against DOACs due to severity of thrombus burden and weight >250lbs. -continue warfarin with Pharm.D. assistance   obesity/OSA/pulmonary hypertension - continue CPAP nightly  -Etiology most likely d/t CHF. RHC revealed moderately elevated PA mean and PVR secondary to combined pre and postcapillary pulmonary hypertension -09/2022 RHC: mean PA 33, PCWP 21, CI 2.04    Morbid Obesity -BMI 44.16 -lifestyle modification   Coronary artery disease -No angina -Previous cardiac catheterization 05/2021 revealed nonobstructive CAD. Not on aspirin due to being on Coumadin  - Continue atorvastatin and metoprolol succinate  Tobacco abuse -Tobacco cessation discussed   Morbid Obesity -BMI 43.48 -lifestyle modification           Family Communication: Significant other bedside 4/16  Consultants:  cardiology  Code Status:  FULL   DVT Prophylaxis:  warfarin   Procedures: As Listed in Progress Note Above  Antibiotics: None     Subjective: Patient has some chest discomfort and shortness of breath last evening.  This morning her breathing is improving.  She has no chest discomfort.  She denies any headache, dizziness, nausea, vomiting, diarrhea, abdominal pain.  Objective: Vitals:   05/07/23 0017 05/07/23 0202 05/07/23 8119 05/07/23 0740  BP: 120/71 (!) 110/94    Pulse: 87 89    Resp:  (!) 26    Temp: 98.3 F (36.8 C)  98.3 F (36.8 C) 98.3 F (36.8 C)  TempSrc: Oral  Oral Oral  SpO2: 100% 100% 96%   Weight: (!) 139.6 kg      Height: 5\' 10"  (1.778 m)       Intake/Output Summary (Last 24 hours) at 05/07/2023 0755 Last data filed at 05/07/2023 0038 Gross per 24 hour  Intake 360 ml  Output --  Net 360 ml   Weight change:  Exam:  General:  Pt is alert, follows commands appropriately, not in acute distress HEENT: No icterus, No thrush, No neck mass, [place]/AT Cardiovascular: RRR, S1/S2, no rubs, no gallops Respiratory: CTA bilaterally, no wheezing, no crackles, no rhonchi Abdomen: Soft/+BS, non tender, non distended, no guarding Extremities: No edema, No lymphangitis, No petechiae, No rashes, no synovitis   Data Reviewed: I have personally reviewed following labs and imaging studies Basic Metabolic Panel: Recent Labs  Lab 05/06/23 1329 05/06/23 1603 05/07/23 0028  NA 132*  --  132*  K 3.6  --  3.8  CL 93*  --  98  CO2 27  --  28  GLUCOSE 101*  --  79  BUN 26*  --  31*  CREATININE 1.06*  --  1.24*  CALCIUM 9.2  --  9.1  MG  --  2.1  --    Liver Function Tests: No results for input(s): "AST", "ALT", "ALKPHOS", "BILITOT", "PROT", "ALBUMIN" in the last 168 hours. No results for input(s): "LIPASE", "AMYLASE" in the last 168 hours. No results for input(s): "AMMONIA" in the last 168 hours. Coagulation Profile: Recent Labs  Lab 04/30/23 1253 05/06/23 1603 05/07/23 0028  INR 1.7* 2.0* 1.9*   CBC: Recent Labs  Lab 05/06/23 1329  WBC 3.9*  NEUTROABS 2.9  HGB 9.2*  HCT 33.6*  MCV 76.7*  PLT 186   Cardiac Enzymes: No results for input(s): "CKTOTAL", "CKMB", "CKMBINDEX", "TROPONINI" in the last 168 hours. BNP: Invalid input(s): "POCBNP" CBG: No results for input(s): "GLUCAP" in the last 168 hours. HbA1C: No results for input(s): "HGBA1C" in the last 72 hours. Urine analysis:    Component Value Date/Time   COLORURINE YELLOW 09/22/2021 1506   APPEARANCEUR HAZY (A) 09/22/2021 1506   APPEARANCEUR Cloudy (A) 07/21/2014 1326   LABSPEC 1.008 09/22/2021 1506   PHURINE 6.0 09/22/2021 1506    GLUCOSEU NEGATIVE 09/22/2021 1506   HGBUR NEGATIVE 09/22/2021 1506   BILIRUBINUR NEGATIVE 09/22/2021 1506   BILIRUBINUR Negative 07/21/2014 1326   KETONESUR NEGATIVE 09/22/2021 1506   PROTEINUR NEGATIVE 09/22/2021 1506   UROBILINOGEN 0.2 06/30/2014 0910   NITRITE NEGATIVE 09/22/2021 1506   LEUKOCYTESUR TRACE (A) 09/22/2021 1506   Sepsis Labs: @LABRCNTIP (procalcitonin:4,lacticidven:4) ) Recent Results (from the past 240 hours)  Resp panel by RT-PCR (RSV, Flu A&B, Covid) Anterior Nasal Swab     Status: None   Collection Time: 05/06/23 12:26 PM   Specimen: Anterior Nasal Swab  Result Value Ref Range Status   SARS Coronavirus 2 by RT PCR NEGATIVE NEGATIVE Final    Comment: (NOTE) SARS-CoV-2 target nucleic acids are NOT DETECTED.  The SARS-CoV-2 RNA is generally detectable in upper respiratory specimens during the acute phase of infection. The lowest concentration of SARS-CoV-2 viral copies this assay can detect is 138 copies/mL. A negative result does not preclude SARS-Cov-2 infection and should not be used as the sole basis for treatment or other patient  management decisions. A negative result may occur with  improper specimen collection/handling, submission of specimen other than nasopharyngeal swab, presence of viral mutation(s) within the areas targeted by this assay, and inadequate number of viral copies(<138 copies/mL). A negative result must be combined with clinical observations, patient history, and epidemiological information. The expected result is Negative.  Fact Sheet for Patients:  BloggerCourse.com  Fact Sheet for Healthcare Providers:  SeriousBroker.it  This test is no t yet approved or cleared by the [place] FDA and  has been authorized for detection and/or diagnosis of SARS-CoV-2 by FDA under an Emergency Use Authorization (EUA). This EUA will remain  in effect (meaning this test can be used) for the  duration of the COVID-19 declaration under Section 564(b)(1) of the Act, 21 U.S.C.section 360bbb-3(b)(1), unless the authorization is terminated  or revoked sooner.       Influenza A by PCR NEGATIVE NEGATIVE Final   Influenza B by PCR NEGATIVE NEGATIVE Final    Comment: (NOTE) The Xpert Xpress SARS-CoV-2/FLU/RSV plus assay is intended as an aid in the diagnosis of influenza from Nasopharyngeal swab specimens and should not be used as a sole basis for treatment. Nasal washings and aspirates are unacceptable for Xpert Xpress SARS-CoV-2/FLU/RSV testing.  Fact Sheet for Patients: BloggerCourse.com  Fact Sheet for Healthcare Providers: SeriousBroker.it  This test is not yet approved or cleared by the [place] FDA and has been authorized for detection and/or diagnosis of SARS-CoV-2 by FDA under an Emergency Use Authorization (EUA). This EUA will remain in effect (meaning this test can be used) for the duration of the COVID-19 declaration under Section 564(b)(1) of the Act, 21 U.S.C. section 360bbb-3(b)(1), unless the authorization is terminated or revoked.     Resp Syncytial Virus by PCR NEGATIVE NEGATIVE Final    Comment: (NOTE) Fact Sheet for Patients: BloggerCourse.com  Fact Sheet for Healthcare Providers: SeriousBroker.it  This test is not yet approved or cleared by the [place] FDA and has been authorized for detection and/or diagnosis of SARS-CoV-2 by FDA under an Emergency Use Authorization (EUA). This EUA will remain in effect (meaning this test can be used) for the duration of the COVID-19 declaration under Section 564(b)(1) of the Act, 21 U.S.C. section 360bbb-3(b)(1), unless the authorization is terminated or revoked.  Performed at [facility], [address], [place], [place] [address]      Scheduled Meds:  atorvastatin  20 mg Oral Daily    Chlorhexidine Gluconate Cloth  6 each Topical Q0600   furosemide  40 mg Intravenous Q12H   losartan  25 mg Oral Daily   metoprolol succinate  25 mg Oral Daily   Warfarin - Pharmacist Dosing Inpatient   Does not apply q1600   Continuous Infusions:  Procedures/Studies: DG Chest 2 View Result Date: 05/06/2023 CLINICAL DATA:  Shortness of breath. EXAM: CHEST - 2 VIEW COMPARISON:  Chest radiograph dated 07/07/2022. FINDINGS: Cardiomegaly. Pulmonary vascular congestion with possible mild interstitial edema. No focal consolidation, pleural effusion, or pneumothorax. No acute osseous abnormality. IMPRESSION: Cardiomegaly with pulmonary vascular congestion and possible mild interstitial edema. Electronically Signed   By: Hart Robinsons M.D.   On: 05/06/2023 15:34    Catarina Hartshorn, DO  [facility]  If 7PM-7AM, please contact night-coverage [email or web address] Password [email or web address] 05/07/2023, 7:55 AM   LOS: 1 day

## 2023-05-07 NOTE — Hospital Course (Addendum)
 48 year old female with a history of f DVT PE 2015 IVC filter prior on Xarelto --- recurrence DVT/PE January 2023 status post thrombectomy on lifelong anticoagulation with Coumadin secondary to weight >250 pounds, OSA on CPAP, Known uterine fibroids, tobacco use presenting with shortness of breath and worsening lower extremity edema up to her thighs of at least 2 days duration. She reports her baseline weight is about 274 pounds, she is weighing 299 here in the ED. She has had to sleep sitting up.   Review of the medical record shows that the patient had a weight of 244.9 pounds on 09/06/2022.  Review of the medical record shows that the patient was taking furosemide 80 mg twice daily at the time of her last cardiology follow-up on 09/16/2022.  However, she states that she was recently changed to torsemide.  In the ED, the patient was afebrile hemodynamically stable with oxygen saturation 96% room air.  WBC 3.9, hemoglobin 9.2, platelets 186.  Chest x-ray showed vascular congestion.  BMP shows sodium 132, potassium 3.6, serum creatinine 1.06, bicarbonate 27.  The patient was started on IV furosemide 40 mg IV twice daily.  Cardiology was consulted to assist with management.

## 2023-05-07 NOTE — Progress Notes (Addendum)
 Responded to nursing call:  low BP with SPB 60s to 70s   Subjective: Pt awake and alert.  Denies cp, abd pain.  Complains of nausea, no emesis.  Denies f/c  Vitals:   05/07/23 1430 05/07/23 1500 05/07/23 1550 05/07/23 1635  BP: (!) 82/47 96/70 (!) 74/56   Pulse:  (!) 55 (!) 52   Resp:  15 14   Temp:    97.8 F (36.6 C)  TempSrc:    Oral  SpO2:  99% 90%   Weight:      Height:       CV--RRR Lung--bibasilar crackles. No wheeze Abd--soft+BS/NT Ext--1 + LE   Assessment/Plan: Hypotension/Cardiogenic Shock --discussed with cardiology --start levophed --place central line>>contacted Dr. Teddi Favors place --start milrinone after CVL --orders placed for Co-Ox by cards after milrinone --transfer to [facility] --spoke with CCM, Dr. DeWald>>accepted for transfer to 2H -hold losartan, metoprolol --pt's mother updated regarding plan     Demaris Fillers, DO [facility]   The patient is critically ill with multiple organ systems failure and requires high complexity decision making for assessment and support, frequent evaluation and titration of therapies, application of advanced monitoring technologies and extensive interpretation of multiple databases.  Critical care time - 45 mins. In addition to that spent by Dr. Adefeso

## 2023-05-07 NOTE — Progress Notes (Signed)
 Heart Failure Navigator Progress Note  Assessed for Heart & Vascular TOC clinic readiness. Patient does not meet criteria due to current Advanced Heart Failure Team patient of Dr. [name] [name], DO.    Navigator available for reassessment of patient and I will schedule her a 1 week hospital follow-up with Dr. Bruce Caper prior to discharge from [facility]. Will wait to see when hospital discharge gets closer for patient before scheduling.   Celedonio Coil, RN, BSN [facility] Heart Failure Navigator Secure Chat Only

## 2023-05-07 NOTE — Progress Notes (Signed)
    Notified by the patient's nurse that she is having hypotension with BP as low as 67/29. Stopped Lasix drip. Reviewed with Dr. Londa Rival and will place a PICC and start Levophed. Hold additional diuretics for now. He did recommend transfer to [facility] and evaluation by AHF once there. Spoke to Dr. Winferd Hatter who has been to see the patient and responding well and SBP checked again and in the 80's. Will continue with plans as discussed above. Will check a Lactic Acid. Check Co-ox once PICC in place. Will made Card Master and AHF aware of pending transfer.   Signed, Dorma Gash, PA-C 05/07/2023, 4:32 PM Pager: [phone]

## 2023-05-07 NOTE — Consult Note (Addendum)
 PHARMACY - ANTICOAGULATION CONSULT NOTE  Pharmacy Consult for Heparin Indication: history of pulmonary embolus and DVT  Allergies  Allergen Reactions   Sulfa Antibiotics Itching, Swelling and Rash    Lip swelling    Patient Measurements: Height: 5\' 10"  (177.8 cm) Weight: (!) 139.6 kg (307 lb 12.2 oz) IBW/kg (Calculated) : 68.5 HEPARIN DW (KG): 101.8  Vital Signs: Temp: 97.9 F (36.6 C) (04/16 2015) Temp Source: Bladder (04/16 2015) BP: 114/93 (04/16 1835) Pulse Rate: 29 (04/16 1835)  Labs: Recent Labs    05/06/23 1329 05/06/23 1603 05/07/23 0028 05/07/23 1102 05/07/23 1701 05/07/23 1910  HGB 9.2*  --   --   --  8.7*  --   HCT 33.6*  --   --   --  32.1*  --   PLT 186  --   --   --  169  --   LABPROT  --  23.1* 22.4*  --   --   --   INR  --  2.0* 1.9*  --   --   --   CREATININE 1.06*  --  1.24*  --   --  1.69*  TROPONINIHS 44* 48*  --  41*  --   --     Estimated Creatinine Clearance: 63.6 mL/min (A) (by C-G formula based on SCr of 1.69 mg/dL (H)).  Medications:  Warfarin 15mg  on Wednesdays and Saturdays, 10mg  all other days --Last dose taken: 10mg  on 4/14@1915   Assessment: 48 y.o. female with medical history significant for systolic CHF, hypertension, DVT and PE on chronic anticoagulation with warfarin, IVC status, OSA on CPAP presented to the ED with complaints of increasing difficulty breathing, and swelling to her bilateral lower extremities up to her thighs that started 2 days ago. She reports her baseline weight is about 274 pounds, she is weighing 299 here in the ED. She has had to sleep sitting up. Pharmacy originally consulted to manage warfarin while admitted.  INR 1.9 (slightly subtherapeutic) this morning. Pt received warfarin 15mg  today at 1600 prior to transfer. Pt transferred to [facility] with cardiogenic shock.   Plan to hold warfarin and start heparin. STAT INR checked and it is 2.4 (therapeutic).  Goal of Therapy:  Heparin level 0.3-0.7  units/ml Monitor platelets by anticoagulation protocol: Yes  Plan:  Daily INR Will plan to start heparin when INR <2  Enrigue Harvard, PharmD, BCPS Please see amion for complete clinical pharmacist phone list 05/07/2023,8:44 PM

## 2023-05-07 NOTE — Progress Notes (Signed)
 eLink Physician-Brief Progress Note Patient Name: [name] [name] DOB: [birth year] MRN: [id number]   Date of Service  05/07/2023  HPI/Events of Note  48 year old female with acute heart failure with reduced ejection fraction currently presenting with severe MR and cardiogenic shock.  Started on Lasix drip initially but turned off in the setting of hypotension.  Now on milrinone with anticipated transfer to [facility]  About to transfer but incontinent of urine  eICU Interventions  Initiate Foley catheter for strict management of I/os     Intervention Category Minor Interventions: Routine modifications to care plan (e.g. PRN medications for pain, fever)  Kourtlyn Charlet 05/07/2023, 7:19 PM

## 2023-05-08 ENCOUNTER — Encounter (HOSPITAL_COMMUNITY): Payer: Self-pay | Admitting: Hematology

## 2023-05-08 DIAGNOSIS — N179 Acute kidney failure, unspecified: Secondary | ICD-10-CM | POA: Diagnosis not present

## 2023-05-08 DIAGNOSIS — I5023 Acute on chronic systolic (congestive) heart failure: Secondary | ICD-10-CM | POA: Diagnosis not present

## 2023-05-08 DIAGNOSIS — G4733 Obstructive sleep apnea (adult) (pediatric): Secondary | ICD-10-CM | POA: Diagnosis not present

## 2023-05-08 LAB — IRON AND TIBC
Iron: 17 ug/dL — ABNORMAL LOW (ref 28–170)
Saturation Ratios: 4 % — ABNORMAL LOW (ref 10.4–31.8)
TIBC: 468 ug/dL — ABNORMAL HIGH (ref 250–450)
UIBC: 451 ug/dL

## 2023-05-08 LAB — BASIC METABOLIC PANEL WITH GFR
Anion gap: 11 (ref 5–15)
BUN: 36 mg/dL — ABNORMAL HIGH (ref 6–20)
CO2: 27 mmol/L (ref 22–32)
Calcium: 8.9 mg/dL (ref 8.9–10.3)
Chloride: 93 mmol/L — ABNORMAL LOW (ref 98–111)
Creatinine, Ser: 1.63 mg/dL — ABNORMAL HIGH (ref 0.44–1.00)
GFR, Estimated: 39 mL/min — ABNORMAL LOW (ref >60)
Glucose, Bld: 132 mg/dL — ABNORMAL HIGH (ref 70–99)
Potassium: 3.9 mmol/L (ref 3.5–5.1)
Sodium: 131 mmol/L — ABNORMAL LOW (ref 135–145)

## 2023-05-08 LAB — FERRITIN: Ferritin: 16 ng/mL (ref 11–307)

## 2023-05-08 LAB — PHOSPHORUS: Phosphorus: 4.4 mg/dL (ref 2.5–4.6)

## 2023-05-08 LAB — CBC
HCT: 30.6 % — ABNORMAL LOW (ref 36.0–46.0)
Hemoglobin: 8.5 g/dL — ABNORMAL LOW (ref 12.0–15.0)
MCH: 21.4 pg — ABNORMAL LOW (ref 26.0–34.0)
MCHC: 27.8 g/dL — ABNORMAL LOW (ref 30.0–36.0)
MCV: 77.1 fL — ABNORMAL LOW (ref 80.0–100.0)
Platelets: 171 10*3/uL (ref 150–400)
RBC: 3.97 MIL/uL (ref 3.87–5.11)
RDW: 22.9 % — ABNORMAL HIGH (ref 11.5–15.5)
WBC: 4.3 10*3/uL (ref 4.0–10.5)
nRBC: 1.4 % — ABNORMAL HIGH (ref 0.0–0.2)

## 2023-05-08 LAB — GLUCOSE, CAPILLARY
Glucose-Capillary: 128 mg/dL — ABNORMAL HIGH (ref 70–99)
Glucose-Capillary: 102 mg/dL — ABNORMAL HIGH (ref 70–99)
Glucose-Capillary: 131 mg/dL — ABNORMAL HIGH (ref 70–99)
Glucose-Capillary: 108 mg/dL — ABNORMAL HIGH (ref 70–99)
Glucose-Capillary: 138 mg/dL — ABNORMAL HIGH (ref 70–99)

## 2023-05-08 LAB — PROTIME-INR
INR: 2.5 — ABNORMAL HIGH (ref 0.8–1.2)
INR: 2.8 — ABNORMAL HIGH (ref 0.8–1.2)
Prothrombin Time: 27.5 s — ABNORMAL HIGH (ref 11.4–15.2)
Prothrombin Time: 29.9 s — ABNORMAL HIGH (ref 11.4–15.2)

## 2023-05-08 LAB — COOXEMETRY PANEL
Carboxyhemoglobin: 2.5 % — ABNORMAL HIGH (ref 0.5–1.5)
Methemoglobin: <0.7 % (ref 0.0–1.5)
O2 Saturation: 73.5 %
Total hemoglobin: 8.9 g/dL — ABNORMAL LOW (ref 12.0–16.0)

## 2023-05-08 LAB — LIPID PANEL
Cholesterol: 75 mg/dL (ref 0–200)
HDL: 22 mg/dL — ABNORMAL LOW (ref >40)
LDL Cholesterol: 46 mg/dL (ref 0–99)
Total CHOL/HDL Ratio: 3.4 ratio
Triglycerides: 35 mg/dL (ref <150)
VLDL: 7 mg/dL (ref 0–40)

## 2023-05-08 LAB — LACTIC ACID, PLASMA: Lactic Acid, Venous: 0.9 mmol/L (ref 0.5–1.9)

## 2023-05-08 LAB — MAGNESIUM
Magnesium: 2.2 mg/dL (ref 1.7–2.4)
Magnesium: 1.9 mg/dL (ref 1.7–2.4)

## 2023-05-08 LAB — HEMOGLOBIN A1C
Hgb A1c MFr Bld: 6 % — ABNORMAL HIGH (ref 4.8–5.6)
Mean Plasma Glucose: 125.5 mg/dL

## 2023-05-08 MED ORDER — SODIUM CHLORIDE 0.9% FLUSH
10.0000 mL | Freq: Two times a day (BID) | INTRAVENOUS | Status: DC
  Administered 2023-05-08 (×2): 10 mL
  Administered 2023-05-09: 40 mL
  Administered 2023-05-09 – 2023-05-10 (×2): 10 mL
  Administered 2023-05-11: 30 mL
  Administered 2023-05-12 – 2023-05-16 (×9): 10 mL

## 2023-05-08 MED ORDER — NOREPINEPHRINE 16 MG/250ML-% IV SOLN
0.0000 ug/min | INTRAVENOUS | Status: DC
  Administered 2023-05-08: 13 ug/min via INTRAVENOUS
  Administered 2023-05-08: 12 ug/min via INTRAVENOUS
  Filled 2023-05-08 (×2): qty 250

## 2023-05-08 MED ORDER — METOLAZONE 2.5 MG PO TABS
2.5000 mg | ORAL_TABLET | Freq: Once | ORAL | Status: AC
  Administered 2023-05-08: 2.5 mg via ORAL
  Filled 2023-05-08: qty 1

## 2023-05-08 MED ORDER — MAGNESIUM SULFATE 2 GM/50ML IV SOLN
2.0000 g | Freq: Once | INTRAVENOUS | Status: AC
  Administered 2023-05-08: 2 g via INTRAVENOUS
  Filled 2023-05-08: qty 50

## 2023-05-08 MED ORDER — CHLORHEXIDINE GLUCONATE CLOTH 2 % EX PADS
6.0000 | MEDICATED_PAD | Freq: Every day | CUTANEOUS | Status: DC
  Administered 2023-05-08 – 2023-05-16 (×9): 6 via TOPICAL

## 2023-05-08 MED ORDER — SODIUM CHLORIDE 0.9% FLUSH: 10.0000 mL | INTRAVENOUS | Status: DC | PRN

## 2023-05-08 MED ORDER — PANTOPRAZOLE SODIUM 40 MG PO TBEC: 40.0000 mg | DELAYED_RELEASE_TABLET | Freq: Every day | ORAL | Status: DC

## 2023-05-08 MED ORDER — POTASSIUM CHLORIDE CRYS ER 20 MEQ PO TBCR
40.0000 meq | EXTENDED_RELEASE_TABLET | Freq: Once | ORAL | Status: AC
  Administered 2023-05-08: 40 meq via ORAL
  Filled 2023-05-08: qty 2

## 2023-05-08 NOTE — Progress Notes (Signed)
 Peripherally Inserted Central Catheter Placement  The IV Nurse has discussed with the patient and/or persons authorized to consent for the patient, the purpose of this procedure and the potential benefits and risks involved with this procedure.  The benefits include less needle sticks, lab draws from the catheter, and the patient may be discharged home with the catheter. Risks include, but not limited to, infection, bleeding, blood clot (thrombus formation), and puncture of an artery; nerve damage and irregular heartbeat and possibility to perform a PICC exchange if needed/ordered by physician.  Alternatives to this procedure were also discussed.  Bard Power PICC patient education guide, fact sheet on infection prevention and patient information card has been provided to patient /or left at bedside.    PICC Placement Documentation  PICC Triple Lumen 05/08/23 Right Brachial 42 cm 2 cm (Active)  Indication for Insertion or Continuance of Line Vasoactive infusions 05/08/23 1209  Exposed Catheter (cm) 2 cm 05/08/23 1209  Site Assessment Clean, Dry, Intact 05/08/23 1209  Lumen #1 Status Flushed;Saline locked;Blood return noted 05/08/23 1209  Lumen #2 Status Flushed;Saline locked;Blood return noted 05/08/23 1209  Lumen #3 Status Flushed;Saline locked;Blood return noted 05/08/23 1209  Dressing Type Transparent;Securing device 05/08/23 1209  Dressing Status Antimicrobial disc/dressing in place;Clean, Dry, Intact 05/08/23 1209  Line Adjustment (NICU/IV Team Only) No 05/08/23 1209  Dressing Intervention New dressing;Adhesive placed at insertion site (IV team only) 05/08/23 1209  Dressing Change Due 05/15/23 05/08/23 1209       [name] [name] 05/08/2023, 12:12 PM

## 2023-05-08 NOTE — Progress Notes (Signed)
 Orthopedic Tech Progress Note Patient Details:  [name] May 28, 1975 [id number]  Ortho Devices Type of Ortho Device: Radio broadcast assistant Ortho Device/Splint Location: BLE Ortho Device/Splint Interventions: Application   Post Interventions Patient Tolerated: Well  [name] [name] E [name] 05/08/2023, 10:08 AM

## 2023-05-08 NOTE — Plan of Care (Signed)

## 2023-05-08 NOTE — Progress Notes (Addendum)
 Advanced Heart Failure Rounding Note  Cardiologist: Dina Rich, MD  Chief Complaint: Cardiogenic shock Subjective:    PICC placed. CVP >20. On lasix gtt at 20. -1L UOP. SCr 1.63. K 3.9 On DBA 5 NE 13  Resting in bed.   Objective:   Weight Range: (!) 143.3 kg Body mass index is 45.33 kg/m.   Vital Signs:   Temp:  [95.9 F (35.5 C)-98.1 F (36.7 C)] 98.1 F (36.7 C) (04/17 1500) Pulse Rate:  [29-90] 70 (04/17 1500) Resp:  [13-30] 19 (04/17 1500) BP: (59-121)/(11-93) 86/66 (04/17 1500) SpO2:  [67 %-100 %] 95 % (04/17 1500) FiO2 (%):  [36 %] 36 % (04/16 2206) Weight:  [143.3 kg] 143.3 kg (04/17 0500) Last BM Date : 05/06/23  Weight change: Filed Weights   05/06/23 1852 05/07/23 0017 05/08/23 0500  Weight: (!) 138.3 kg (!) 139.6 kg (!) 143.3 kg    Intake/Output:   Intake/Output Summary (Last 24 hours) at 05/08/2023 1603 Last data filed at 05/08/2023 1500 Gross per 24 hour  Intake 867.41 ml  Output 2260 ml  Net -1392.59 ml      Physical Exam   General:  well appearing.  No respiratory difficulty Neck: supple. JVD top ear.  Cor: PMI nondisplaced. Regular rate & rhythm. No rubs, gallops. + 2/6 MR. Lungs: clear, diminished bases Abdomen: soft, nontender, nondistended. Good bowel sounds. Extremities: no cyanosis, clubbing, rash, +2/3 BLE edema. + UNNA boots. PICC RUE Neuro: alert & oriented x 3. Moves all 4 extremities w/o difficulty. Affect pleasant.   Telemetry   NSR 70s (Personally reviewed)    EKG    No new EKG to review  Labs    CBC Recent Labs    05/06/23 1329 05/07/23 1701 05/08/23 0309  WBC 3.9* 4.8 4.3  NEUTROABS 2.9  --   --   HGB 9.2* 8.7* 8.5*  HCT 33.6* 32.1* 30.6*  MCV 76.7* 79.1* 77.1*  PLT 186 169 171   Basic Metabolic Panel Recent Labs    40/98/11 1603 05/07/23 0028 05/07/23 1910 05/08/23 0309 05/08/23 0333  NA  --    < > 130*  --  131*  K  --    < > 4.4  --  3.9  CL  --    < > 91*  --  93*  CO2  --    < > 25   --  27  GLUCOSE  --    < > 127*  --  132*  BUN  --    < > 37*  --  36*  CREATININE  --    < > 1.69*  --  1.63*  CALCIUM  --    < > 9.1  --  8.9  MG 2.1  --   --  2.2  --   PHOS  --   --   --  4.4  --    < > = values in this interval not displayed.   Liver Function Tests Recent Labs    05/07/23 1102  AST 35  ALT 27  ALKPHOS 122  BILITOT 2.3*  PROT 8.3*  ALBUMIN 3.1*   No results for input(s): "LIPASE", "AMYLASE" in the last 72 hours. Cardiac Enzymes No results for input(s): "CKTOTAL", "CKMB", "CKMBINDEX", "TROPONINI" in the last 72 hours.  BNP: BNP (last 3 results) Recent Labs    09/16/22 1145 12/31/22 0904 05/06/23 1603  BNP 1,157.1* 1,296.0* 1,671.0*    ProBNP (last 3 results) No results for input(s): "  PROBNP" in the last 8760 hours.   D-Dimer No results for input(s): "DDIMER" in the last 72 hours. Hemoglobin A1C Recent Labs    05/08/23 0309  HGBA1C 6.0*   Fasting Lipid Panel Recent Labs    05/08/23 0309  CHOL 75  HDL 22*  LDLCALC 46  TRIG 35  CHOLHDL 3.4   Thyroid Function Tests No results for input(s): "TSH", "T4TOTAL", "T3FREE", "THYROIDAB" in the last 72 hours.  Invalid input(s): "FREET3"  Other results:   Imaging    [place] EKG SITE RITE Result Date: 05/07/2023 If Site Rite image not attached, placement could not be confirmed due to current cardiac rhythm.  [place] EKG SITE RITE Result Date: 05/07/2023 If Site Rite image not attached, placement could not be confirmed due to current cardiac rhythm.    Medications:     Scheduled Medications:  atorvastatin  20 mg Oral Daily   Chlorhexidine Gluconate Cloth  6 each Topical Daily   insulin aspart  0-15 Units Subcutaneous Q4H   metolazone  2.5 mg Oral Once   pantoprazole  40 mg Oral Daily   sodium chloride flush  10-40 mL Intracatheter Q12H   sodium chloride flush  10-40 mL Intracatheter Q12H    Infusions:  DOBUTamine 5 mcg/kg/min (05/08/23 1500)   furosemide (LASIX) 200 mg in dextrose 5  % 100 mL (2 mg/mL) infusion 20 mg/hr (05/08/23 1500)   norepinephrine (LEVOPHED) Adult infusion 13 mcg/min (05/08/23 1500)    PRN Medications: acetaminophen **OR** acetaminophen, albuterol, guaiFENesin-dextromethorphan, ondansetron **OR** ondansetron (ZOFRAN) IV, polyethylene glycol, sodium chloride flush, sodium chloride flush    Patient Profile   JEMA DEEGAN is a 48 y.o. 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 Admitted to [facility] ICU with A/C HFrEF with BiV dysfunction. Went into cardiogenic shock and was transferred to [facility].   Assessment/Plan   Acute on chronic systolic HF with biventricualr dysfunction  -> cardiogenic shock - LHC in 2023 with nonobstructive CAD.  - RHC 9/24 RA 5 PA 53/21 (33) PCWP 21 Fick 4.6/2.0 TD 5.0/2.24 - Echo 4/25 EF 20-25% severe RV dysfunction with septal flattening Mod-severe MR - Continue DBA 5 and NE @ 13. Continue NE to maintain MAP >= 70 - PICC placed. Co-ox pending. CVP >20 - Increase lasix gtt to 30 mg/hr. Add metolazone 2.5 mg today. (Will give additional K with this). Check BMET/Mg.  - Not ideal canddiate for VAD due to RV dysfunction. Not transplant candidate for transplant due to size and noncompliance. Body mass index is 44.16 kg/m.   2. Severe MR - TEE confirming severe functional MR. Will discuss possibility for TEER with structural. - Current echo mod MR   3. AKI  - due to ATN shock - continue hemodynamic support - SCr stable today after starting diuresis. 1.63 today  - Baseline SCr 1.1   4. Hx of PE/DVT w/ IVC filter -Submassive saddle PE extending from the distal main pulmonary artery and to the left and right pulmonary arteries with extension into the left upper lobe, left lower lobe and lingula branches and right upper lobe, right middle lobe and right lower lobes on  04/27/2013  - Evaluated at [facility] where thrombophilia work-up was negative (Lupus inhibitor, anticardiolipin antibodies, and anti beta-2 glycoprotein antibodies were negative; protein C activity 85%, protein S activity 76%, factor V Leiden, and prothrombin gene mutation ) - IVC  filter in place; could not be removed due to significant thrombus burden on filter at [facility].  - Previously advised against DOACs due to severity of thrombus burden and weight >250lbs.  - Hold warfarin. Start heparin gtt when INR <2. INR 2.8 today   5. Morbid obesity  - Body mass index is 44.16 kg/m. - consider GLP1RA   6. CAD - LHC in 5/23 w/ nonobstructive CAD - Followed by general cardiology - No s/s angina   7. Microcytic anemia - tSat 4, ferritin 16 - Would benefit from IV iron  CRITICAL CARE Performed by: Sheryl Donna   Total critical care time: 14 minutes  Critical care time was exclusive of separately billable procedures and treating other patients.  Critical care was necessary to treat or prevent imminent or life-threatening deterioration.  Critical care was time spent personally by me on the following activities: development of treatment plan with patient and/or surrogate as well as nursing, discussions with consultants, evaluation of patient's response to treatment, examination of patient, obtaining history from patient or surrogate, ordering and performing treatments and interventions, ordering and review of laboratory studies, ordering and review of radiographic studies, pulse oximetry and re-evaluation of patient's condition.     Length of Stay: 2  Sheryl Donna, NP  05/08/2023, 4:03 PM  Advanced Heart Failure Team Pager [phone] (M-F; 7a - 5p)  Please contact [facility] for night-coverage after hours (5p -7a ) and weekends on [email or web address]  Agree with above  Now on DBA 5 NE 13. MAPs remain marginal. Modest diuresis on lasix gtt at 20   R femoral TLC switched over to PICC. CVP 20 Co-ox  74%  Breathing better than last night.   General:  Obese woman in bed No resp difficulty HEENT: normal Neck: supple. JVP to ear  Carotids 2+ bilat; no bruits. No lymphadenopathy or thryomegaly appreciated. Cor: Regular rate & rhythm.2/6 TR Lungs: clear Abdomen: obese  soft, nontender, nondistended. No hepatosplenomegaly. No bruits or masses. Good bowel sounds. Extremities: no cyanosis, clubbing, rash, 2-3+ edema R>L Neuro: alert & orientedx3, cranial nerves grossly intact. moves all 4 extremities w/o difficulty. Affect pleasant  Remains markedly volume overloaded. Continue NE/DBA to support BP and continue diuresis. AKI has improved. Increase lasix gtt to 30.,Once diuresed will wean as able. Can restart midodrine to support BP.   INR 2.8 Start heparin when < 2.0   Not candidate for advanced HF therapies at this point.   CRITICAL CARE Performed by: Jules Oar  Total critical care time: 42 minutes  Critical care time was exclusive of separately billable procedures and treating other patients.  Critical care was necessary to treat or prevent imminent or life-threatening deterioration.  Critical care was time spent personally by me (independent of midlevel providers or residents) on the following activities: development of treatment plan with patient and/or surrogate as well as nursing, discussions with consultants, evaluation of patient's response to treatment, examination of patient, obtaining history from patient or surrogate, ordering and performing treatments and interventions, ordering and review of laboratory studies, ordering and review of radiographic studies, pulse oximetry and re-evaluation of patient's condition.  Jules Oar, MD  5:32 PM  .

## 2023-05-08 NOTE — Progress Notes (Signed)
   05/08/23 2244  BiPAP/CPAP/SIPAP  BiPAP/CPAP/SIPAP Pt Type Adult  BiPAP/CPAP/SIPAP Resmed  Mask Type Full face mask  Mask Size Medium  Respiratory Rate 21 breaths/min  EPAP 10 cmH2O  FiO2 (%) 36 %  Flow Rate 4 lpm  Patient Home Machine No  Patient Home Mask No  Patient Home Tubing No  Auto Titrate No  BiPAP/CPAP /SiPAP Vitals  Pulse Rate 78  Resp (!) 21  SpO2 98 %  Bilateral Breath Sounds Clear;Diminished  MEWS Score/Color  MEWS Score 2  MEWS Score Color Yellow

## 2023-05-08 NOTE — Progress Notes (Addendum)
 NAME:  [name] [name], MRN:  [id number], DOB:  July 21, 1975, LOS: 2 ADMISSION DATE:  05/06/2023, CONSULTATION DATE:  05/07/23 REFERRING MD:  Tat , CHIEF COMPLAINT:  cardiogenic shock   History of Present Illness:  48 year old female with PMH as below, which is significant for HFrEF NICM wit LEVF 25-30%, Severe MR, PE on lifelong anticoagulation and IVC filter in place, OSA non-compliant with CPAP who presented to [facility] ED 4/15 with complaints of progressive shortness of breath and lower extremity edema. Based on clinic notes it seems as though edema had been worsening for some time. Diuretic dosing had been increasing and she was transitioned from lasix to torsemide in early April. Shortness of breath started about 4-5 days prior to arrival.  She had associated cough with clear sputum.  Denies fevers or chills.  Shortness of breath became severe on 4/15 causing her to present to [facility], ED.  She did also complain of mild chest tightness and headaches.  In the emergency department she was treated for CHF exacerbation with diuretics and was admitted to the hospitalist service.  She was noted to have a 25 pound weight gain.  GDMT was resumed in the hospital with the exception of Entresto which she had not been taking due to low blood pressures.  BNP 1671.  Cardiology was consulted and initiated Lasix infusion.  She became hypotensive and was started on norepinephrine.  She was transferred to [facility] for advanced heart failure evaluation.  PCCM accepted for ICU admission.  Pertinent  Medical History  Combined systolic and diastolic HF pHTN OSA PE on warfarin  Medical noncompliance  Significant Hospital Events: Including procedures, antibiotic start and stop dates in addition to other pertinent events     Interim History / Subjective:  NAEON. Milrinone switched to DBA by AHF. Doing well this AM. Decent UOP 1.4L on Lasix gtt at 20.  Objective   Blood pressure 103/61, pulse 62,  temperature (!) 97 F (36.1 C), resp. rate 18, height 5\' 10"  (1.778 m), weight (!) 143.3 kg, last menstrual period 03/25/2023, SpO2 100%.    FiO2 (%):  [36 %] 36 %   Intake/Output Summary (Last 24 hours) at 05/08/2023 0753 Last data filed at 05/08/2023 0700 Gross per 24 hour  Intake 934.83 ml  Output 1375 ml  Net -440.17 ml   Filed Weights   05/06/23 1852 05/07/23 0017 05/08/23 0500  Weight: (!) 138.3 kg (!) 139.6 kg (!) 143.3 kg    Examination: General: Middle-aged female in no acute distress HENT: Normocephalic, atraumatic, CPAP in place Lungs: Clear bilateral breath sounds, normal work of breathing Cardiovascular: Regular rate and rhythm, 4+ pitting edema in bilateral lower extremities to the knee Abdomen: Soft, nontender, nondistended, anasarca Extremities: No acute deformity or range of motion limitation.  Edema as above Neuro: Alert, oriented, nonfocal GU: Foley catheter   Assessment & Plan:   Cardiogenic shock  Acute on chronic HFrEF: LVEF 25 to 30%. Severe Mitral regurg  Nonobstructive CAD - AHF managing - Continue NE and DBA - Continue Lasix infusion, goal neg balance - Strict intake and output - PICC requested as central line currently is femoral.  Will check central venous oxygen saturation once PICC in place. - Possibility of TEER, AHF to discuss with structural heart team - Not a candidate for transplant - Poor candidate for VAD  Hx saddle PE/DVT with IVC filter in place (April 2015) - Heparin infusion per pharmacy, start when INR < 2 -  IVC filter in place, could not be removed due to thrombus burden - Holding home warfarin, weight prohibits DOAC  AKI: Creatinine bump from 1-1.69 - Trend chemistries - Strict I/O with lasix infusion - Keep K greater than 4 and mag greater than 2  OSA: Noncompliant with CPAP - Continue nocturnal CPAP   Discussed with Dr. Bensimhon. No CCM needs at this point. Will transfer to AHF team and CCM off. Can notify us  if  any needs arise.    Best Practice (right click and "Reselect all SmartList Selections" daily)  Diet/type: Heart healthy DVT prophylaxis systemic heparin Pressure ulcer(s): pressure ulcer assessment deferred  GI prophylaxis: N/A Lines: Central line, PICC order placed Foley:  Yes, and it is still needed Code Status:  full code Last date of multidisciplinary goals of care discussion [ ]    Critical care time: 35 minutes    Rafael Bun, PA - [facility] For pager details, please see AMION or use Epic chat  After 1900, please call [facility] for cross coverage needs 05/08/2023, 8:01 AM

## 2023-05-08 NOTE — Consult Note (Signed)
 PHARMACY - ANTICOAGULATION CONSULT NOTE  Pharmacy Consult for Warfarin Indication: history of pulmonary embolus and DVT  Allergies  Allergen Reactions   Sulfa Antibiotics Itching, Swelling and Rash    Lip swelling    Patient Measurements: Height: 5\' 10"  (177.8 cm) Weight: (!) 143.3 kg (315 lb 14.7 oz) IBW/kg (Calculated) : 68.5 HEPARIN DW (KG): 101.8  Vital Signs: Temp: 97.7 F (36.5 C) (04/17 1115) Temp Source: Bladder (04/17 0400) BP: 95/64 (04/17 1115) Pulse Rate: 62 (04/17 1115)  Labs: Recent Labs    05/06/23 1329 05/06/23 1603 05/06/23 1603 05/07/23 0028 05/07/23 1102 05/07/23 1701 05/07/23 1910 05/07/23 2108 05/08/23 0309 05/08/23 0333 05/08/23 0528  HGB 9.2*  --   --   --   --  8.7*  --   --  8.5*  --   --   HCT 33.6*  --   --   --   --  32.1*  --   --  30.6*  --   --   PLT 186  --   --   --   --  169  --   --  171  --   --   LABPROT  --  23.1*   < > 22.4*  --   --   --  26.7* 27.5*  --  29.9*  INR  --  2.0*   < > 1.9*  --   --   --  2.4* 2.5*  --  2.8*  CREATININE 1.06*  --   --  1.24*  --   --  1.69*  --   --  1.63*  --   TROPONINIHS 44* 48*  --   --  41*  --   --   --   --   --   --    < > = values in this interval not displayed.    Estimated Creatinine Clearance: 67 mL/min (A) (by C-G formula based on SCr of 1.63 mg/dL (H)).   Medical History: Past Medical History:  Diagnosis Date   Anemia    Blood transfusion without reported diagnosis    CAD (coronary artery disease)    Nonobstructive   Closed left ankle fracture    August 30 2012   Clotting disorder (HCC)    DVT (deep venous thrombosis) (HCC)    L leg   Fibroid tumor    HFrEF (heart failure with reduced ejection fraction) (HCC)    Hidradenitis    Hypertension    Mitral regurgitation    Myocardial infarction (HCC)    NICM (nonischemic cardiomyopathy) (HCC)    NSVT (nonsustained ventricular tachycardia) (HCC)    Obesity    OSA on CPAP    Pulmonary embolism (HCC) 04/2013   Sleep  apnea     Medications:   Assessment: 48 y.o. female with medical history significant for systolic CHF, hypertension, DVT and PE on chronic anticoagulation with warfarin, IVC status, OSA on CPAP presented to the ED with complaints of increasing difficulty breathing, and swelling to her bilateral lower extremities up to her thighs that started 2 days ago. She reports her baseline weight is about 274 pounds, she is weighing 299 here in the ED. She has had to sleep sitting up. Pharmacy has been consulted to manage warfarin while admitted. Last dose warfarin (15 mg) 05/07/23 PM.  Of note: patient's home warfarin regimen recently changed(per anticoagulation clinic note on 04/30/2023) from 10mg  daily to 15mg  on Wednesday and Saturdays, and 10mg  all other days.  INR  is in therapeutic range at 2.8 on after receiving booster dose of 15 mg yesterday. Plan to hold warfarin for now and initiation heparin infusion once INR <2.0. CBC is stable. No signs of bleeding per RN.  Goal of Therapy:  INR 2-3 Monitor platelets by anticoagulation protocol: Yes  Plan:  Hold warfarin Once INR <2.0, initiation UFH infusion INR/CBC daily Monitor for S/S of bleeding  Albino Alu, PharmD PGY2 Cardiology Pharmacy Resident 05/08/2023,1:10 PM

## 2023-05-08 NOTE — TOC Initial Note (Signed)
 Transition of Care [facility]) - Initial/Assessment Note    Patient Details  Name: [name] [name] MRN: [id number] Date of Birth: 1975/06/05  [facility] CM/SW Contact:    Benjiman Bras, RN Phone Number: [phone] 05/08/2023, 4:13 PM  Clinical Narrative:                 TOC CM spoke to pt and states she was independent at home. Lives with minor child and parents in the home. States she has CPAP at home. Drives to appts.   Will continue to follow for dc needs.   Expected Discharge Plan: Home w Home Health Services Barriers to Discharge: Continued Medical Work up   Patient Goals and CMS Choice Patient states their goals for this hospitalization and ongoing recovery are:: wants to feel better          Expected Discharge Plan and Services   Discharge Planning Services: CM Consult   Living arrangements for the past 2 months: Single Family Home                                      Prior Living Arrangements/Services Living arrangements for the past 2 months: Single Family Home Lives with:: Minor Children, Parents Patient language and need for interpreter reviewed:: Yes Do you feel safe going back to the place where you live?: Yes      Need for Family Participation in Patient Care: Yes (Comment) Care giver support system in place?: Yes (comment) Current home services: DME (CPAP) Criminal Activity/Legal Involvement Pertinent to Current Situation/Hospitalization: No - Comment as needed  Activities of Daily Living   ADL Screening (condition at time of admission) Independently performs ADLs?: Yes (appropriate for developmental age) Is the patient deaf or have difficulty hearing?: No Does the patient have difficulty seeing, even when wearing glasses/contacts?: No Does the patient have difficulty concentrating, remembering, or making decisions?: No  Permission Sought/Granted Permission sought to share information with : Case Manager, Family Supports,  PCP Permission granted to share information with : Yes, Verbal Permission Granted              Emotional Assessment Appearance:: Appears stated age Attitude/Demeanor/Rapport: Engaged Affect (typically observed): Accepting Orientation: : Oriented to Self, Oriented to Place, Oriented to  Time, Oriented to Situation   Psych Involvement: No (comment)  Admission diagnosis:  Acute on chronic combined systolic (congestive) and diastolic (congestive) heart failure (HCC) [I50.43] Cardiogenic shock (HCC) [R57.0] Patient Active Problem List   Diagnosis Date Noted   Cardiogenic shock (HCC) 05/07/2023   Acute on chronic combined systolic (congestive) and diastolic (congestive) heart failure (HCC) 05/06/2023   HFrEF (heart failure with reduced ejection fraction) (HCC) 07/08/2022   Acute exacerbation of congestive heart failure (HCC) 07/02/2022   Acute on chronic HFrEF (heart failure with reduced ejection fraction) (HCC) 02/13/2022   Breast swelling 01/24/2022   Cellulitis of breast 12/28/2021   Closed cervical spine fracture/C2 09/22/2021   Syncope and collapse 09/22/2021   Obesity (BMI 30-39.9) 09/22/2021   Pulmonary edema 06/14/2021   Dysphagia 06/14/2021   CAP (community acquired pneumonia) 06/05/2021   Other pancytopenia (HCC) 06/05/2021   Essential hypertension 06/05/2021   Hypokalemia 06/05/2021   Microcytic anemia    Lumbar spondylosis 12/14/2018   Lumbar foraminal stenosis 12/14/2018   Lumbar degenerative disc disease 12/14/2018   Chronic anticoagulation 03/24/2018   STD exposure 03/11/2018   History of  miscarriage 12/01/2017   Left leg DVT (HCC) 12/01/2017   Menorrhagia 12/01/2017   S/P IVC filter 12/01/2017   Chronic bilateral low back pain with bilateral sciatica 12/01/2017   Chronic pain of left ankle 12/01/2017   Chronic pain syndrome 12/01/2017   Long term current use of opiate analgesic 12/01/2017   Encounter for therapeutic drug monitoring 09/03/2017   History of  pulmonary embolism 08/27/2017   Abdominal cramps 08/30/2015   Obesity, Class III, BMI 40-49.9 (morbid obesity) (HCC) 08/18/2014   Uterine leiomyoma 08/18/2014   Benign essential hypertension 07/21/2014   Severe Iron deficiency anemia 02/05/2014   Back pain with left-sided radiculopathy 10/31/2013   Secondary pulmonary hypertension 07/06/2013   OSA on CPAP 07/06/2013   Pleuritic chest pain 04/27/2013   SOB (shortness of breath) 04/27/2013   Anemia 04/27/2013   Tobacco use disorder 04/27/2013   Saddle embolus of pulmonary artery with acute cor pulmonale (HCC) 04/27/2013   Bilateral pulmonary embolism (HCC) 04/27/2013   PCP:  Rosanna Comment, FNP Pharmacy:   [facility] - [place], [place] - [address] [place] [address] Phone: [phone] Fax: [phone]     Social Drivers of Health (SDOH) Social History: SDOH Screenings   Food Insecurity: No Food Insecurity (05/06/2023)  Housing: Low Risk  (05/06/2023)  Transportation Needs: No Transportation Needs (05/06/2023)  Utilities: Not At Risk (05/06/2023)  Alcohol Screen: Low Risk  (04/03/2023)  Depression (PHQ2-9): Low Risk  (09/13/2022)  Financial Resource Strain: Low Risk  (04/03/2023)  Physical Activity: Insufficiently Active (04/03/2023)  Social Connections: Socially Isolated (04/03/2023)  Stress: No Stress Concern Present (04/03/2023)  Tobacco Use: High Risk (05/06/2023)   SDOH Interventions:     Readmission Risk Interventions    07/03/2022   12:12 PM 02/13/2022   12:41 PM 12/31/2021    1:52 PM  Readmission Risk Prevention Plan  Transportation Screening Complete Complete   HRI or Home Care Consult Complete Complete Complete  Social Work Consult for Recovery Care Planning/Counseling Complete Complete Complete  Palliative Care Screening Not Applicable Not Applicable Not Applicable  Medication Review Oceanographer) Complete Complete Complete

## 2023-05-08 NOTE — Progress Notes (Signed)
 Heart Failure Navigator Progress Note  Assessed for Heart & Vascular TOC clinic readiness.  Patient does not meet criteria due to Advanced Heart Failure Team patient of Dr. Gasper Lloyd.   Navigator will sign off at this time.   Rhae Hammock, BSN, Scientist, clinical (histocompatibility and immunogenetics) Only

## 2023-05-09 DIAGNOSIS — I5023 Acute on chronic systolic (congestive) heart failure: Secondary | ICD-10-CM | POA: Diagnosis not present

## 2023-05-09 DIAGNOSIS — I340 Nonrheumatic mitral (valve) insufficiency: Secondary | ICD-10-CM | POA: Diagnosis not present

## 2023-05-09 DIAGNOSIS — N179 Acute kidney failure, unspecified: Secondary | ICD-10-CM | POA: Diagnosis not present

## 2023-05-09 LAB — BASIC METABOLIC PANEL WITH GFR
Anion gap: 6 (ref 5–15)
Anion gap: 8 (ref 5–15)
Anion gap: 9 (ref 5–15)
BUN: 26 mg/dL — ABNORMAL HIGH (ref 6–20)
BUN: 27 mg/dL — ABNORMAL HIGH (ref 6–20)
BUN: 30 mg/dL — ABNORMAL HIGH (ref 6–20)
CO2: 31 mmol/L (ref 22–32)
CO2: 32 mmol/L (ref 22–32)
CO2: 34 mmol/L — ABNORMAL HIGH (ref 22–32)
Calcium: 8.4 mg/dL — ABNORMAL LOW (ref 8.9–10.3)
Calcium: 8.5 mg/dL — ABNORMAL LOW (ref 8.9–10.3)
Calcium: 8.7 mg/dL — ABNORMAL LOW (ref 8.9–10.3)
Chloride: 91 mmol/L — ABNORMAL LOW (ref 98–111)
Chloride: 93 mmol/L — ABNORMAL LOW (ref 98–111)
Chloride: 94 mmol/L — ABNORMAL LOW (ref 98–111)
Creatinine, Ser: 1.13 mg/dL — ABNORMAL HIGH (ref 0.44–1.00)
Creatinine, Ser: 1.24 mg/dL — ABNORMAL HIGH (ref 0.44–1.00)
Creatinine, Ser: 1.33 mg/dL — ABNORMAL HIGH (ref 0.44–1.00)
GFR, Estimated: 50 mL/min — ABNORMAL LOW (ref >60)
GFR, Estimated: 54 mL/min — ABNORMAL LOW (ref >60)
GFR, Estimated: >60 mL/min (ref >60)
Glucose, Bld: 110 mg/dL — ABNORMAL HIGH (ref 70–99)
Glucose, Bld: 114 mg/dL — ABNORMAL HIGH (ref 70–99)
Glucose, Bld: 155 mg/dL — ABNORMAL HIGH (ref 70–99)
Potassium: 3.4 mmol/L — ABNORMAL LOW (ref 3.5–5.1)
Potassium: 3.4 mmol/L — ABNORMAL LOW (ref 3.5–5.1)
Potassium: 3.9 mmol/L (ref 3.5–5.1)
Sodium: 131 mmol/L — ABNORMAL LOW (ref 135–145)
Sodium: 133 mmol/L — ABNORMAL LOW (ref 135–145)
Sodium: 134 mmol/L — ABNORMAL LOW (ref 135–145)

## 2023-05-09 LAB — GLUCOSE, CAPILLARY
Glucose-Capillary: 103 mg/dL — ABNORMAL HIGH (ref 70–99)
Glucose-Capillary: 112 mg/dL — ABNORMAL HIGH (ref 70–99)
Glucose-Capillary: 112 mg/dL — ABNORMAL HIGH (ref 70–99)
Glucose-Capillary: 120 mg/dL — ABNORMAL HIGH (ref 70–99)
Glucose-Capillary: 125 mg/dL — ABNORMAL HIGH (ref 70–99)
Glucose-Capillary: 152 mg/dL — ABNORMAL HIGH (ref 70–99)
Glucose-Capillary: 94 mg/dL (ref 70–99)

## 2023-05-09 LAB — COOXEMETRY PANEL
Carboxyhemoglobin: 1.9 % — ABNORMAL HIGH (ref 0.5–1.5)
Carboxyhemoglobin: 2.2 % — ABNORMAL HIGH (ref 0.5–1.5)
Methemoglobin: 0.7 % (ref 0.0–1.5)
Methemoglobin: <0.7 % (ref 0.0–1.5)
O2 Saturation: 68 %
O2 Saturation: 71.4 %
Total hemoglobin: 9.1 g/dL — ABNORMAL LOW (ref 12.0–16.0)
Total hemoglobin: 9.2 g/dL — ABNORMAL LOW (ref 12.0–16.0)

## 2023-05-09 LAB — CBC
HCT: 31.5 % — ABNORMAL LOW (ref 36.0–46.0)
Hemoglobin: 8.8 g/dL — ABNORMAL LOW (ref 12.0–15.0)
MCH: 21.5 pg — ABNORMAL LOW (ref 26.0–34.0)
MCHC: 27.9 g/dL — ABNORMAL LOW (ref 30.0–36.0)
MCV: 76.8 fL — ABNORMAL LOW (ref 80.0–100.0)
Platelets: 178 10*3/uL (ref 150–400)
RBC: 4.1 MIL/uL (ref 3.87–5.11)
RDW: 22.8 % — ABNORMAL HIGH (ref 11.5–15.5)
WBC: 5.4 10*3/uL (ref 4.0–10.5)
nRBC: 0.6 % — ABNORMAL HIGH (ref 0.0–0.2)

## 2023-05-09 LAB — PROTIME-INR
INR: 5.3 (ref 0.8–1.2)
INR: 4 — ABNORMAL HIGH (ref 0.8–1.2)
Prothrombin Time: 48.9 s — ABNORMAL HIGH (ref 11.4–15.2)
Prothrombin Time: 38.9 s — ABNORMAL HIGH (ref 11.4–15.2)

## 2023-05-09 LAB — MAGNESIUM: Magnesium: 2 mg/dL (ref 1.7–2.4)

## 2023-05-09 LAB — PHOSPHORUS: Phosphorus: 3.4 mg/dL (ref 2.5–4.6)

## 2023-05-09 MED ORDER — PANTOPRAZOLE SODIUM 40 MG PO TBEC
40.0000 mg | DELAYED_RELEASE_TABLET | Freq: Every day | ORAL | Status: DC
  Administered 2023-05-09 – 2023-05-16 (×8): 40 mg via ORAL
  Filled 2023-05-09 (×8): qty 1

## 2023-05-09 MED ORDER — MAGNESIUM SULFATE 2 GM/50ML IV SOLN
2.0000 g | Freq: Once | INTRAVENOUS | Status: AC
  Administered 2023-05-09: 2 g via INTRAVENOUS
  Filled 2023-05-09: qty 50

## 2023-05-09 MED ORDER — IPRATROPIUM-ALBUTEROL 0.5-2.5 (3) MG/3ML IN SOLN
3.0000 mL | Freq: Four times a day (QID) | RESPIRATORY_TRACT | Status: DC
  Administered 2023-05-09 – 2023-05-10 (×7): 3 mL via RESPIRATORY_TRACT
  Filled 2023-05-09 (×7): qty 3

## 2023-05-09 MED ORDER — POTASSIUM CHLORIDE CRYS ER 20 MEQ PO TBCR
40.0000 meq | EXTENDED_RELEASE_TABLET | ORAL | Status: AC
Start: 2023-05-09 — End: 2023-05-09
  Administered 2023-05-09 (×2): 40 meq via ORAL
  Filled 2023-05-09 (×2): qty 2

## 2023-05-09 MED ORDER — POTASSIUM CHLORIDE CRYS ER 20 MEQ PO TBCR
40.0000 meq | EXTENDED_RELEASE_TABLET | ORAL | Status: AC
  Administered 2023-05-09 (×2): 40 meq via ORAL
  Filled 2023-05-09 (×2): qty 2

## 2023-05-09 MED ORDER — CYCLOBENZAPRINE HCL 10 MG PO TABS
5.0000 mg | ORAL_TABLET | Freq: Three times a day (TID) | ORAL | Status: DC | PRN
  Administered 2023-05-09 – 2023-05-14 (×11): 5 mg via ORAL
  Filled 2023-05-09 (×11): qty 1

## 2023-05-09 MED ORDER — TRAMADOL-ACETAMINOPHEN 37.5-325 MG PO TABS
1.0000 | ORAL_TABLET | Freq: Once | ORAL | Status: AC
  Administered 2023-05-09: 1 via ORAL
  Filled 2023-05-09: qty 1

## 2023-05-09 NOTE — Progress Notes (Addendum)
 Advanced Heart Failure Rounding Note  Cardiologist: Armida Lander, MD  Chief Complaint: Cardiogenic shock Subjective:    Co-ox 71%. CVP 16 On DBA 5 + NE 7.  Net negative 8.3L on Lasix  30/hr + metolazone  2.5 mg. Weight down 10 lbs sCr 1.63>1.33. Na 133  Sitting up in the chair this morning. Feeling a little better. SOB is about the same as yesterday. Tired. Reports some cramping.   Objective:    Weight Range: (!) 138.1 kg Body mass index is 43.68 kg/m.   Vital Signs:   Temp:  [96.4 F (35.8 C)-98.2 F (36.8 C)] 98.1 F (36.7 C) (04/17 2030) Pulse Rate:  [57-250] 86 (04/18 0700) Resp:  [14-32] 21 (04/18 0700) BP: (64-160)/(24-147) 125/77 (04/18 0650) SpO2:  [70 %-100 %] 93 % (04/18 0700) FiO2 (%):  [36 %] 36 % (04/17 2244) Weight:  [138.1 kg] 138.1 kg (04/18 0500) Last BM Date : 05/06/23  Weight change: Filed Weights   05/07/23 0017 05/08/23 0500 05/09/23 0500  Weight: (!) 139.6 kg (!) 143.3 kg (!) 138.1 kg   Intake/Output:  Intake/Output Summary (Last 24 hours) at 05/09/2023 0721 Last data filed at 05/09/2023 0700 Gross per 24 hour  Intake 824.79 ml  Output 8880 ml  Net -8055.21 ml    Physical Exam    CVP 16 General: Well appearing. No distress on RA Cardiac: JVP to mid neck. S1 and S2 present. No murmurs or rub. Resp: Diminished  Extremities: Warm and dry.  1+ BLE edema.  Neuro: Alert and oriented x3. Affect pleasant. Moves all extremities without difficulty. Lines: RUE PICC  Telemetry   SR in 80s, 5 PVCs/min (personally reviewed)  EKG    No new EKG to review  Labs    CBC Recent Labs    05/06/23 1329 05/07/23 1701 05/08/23 0309 05/09/23 0021  WBC 3.9*   < > 4.3 5.4  NEUTROABS 2.9  --   --   --   HGB 9.2*   < > 8.5* 8.8*  HCT 33.6*   < > 30.6* 31.5*  MCV 76.7*   < > 77.1* 76.8*  PLT 186   < > 171 178   < > = values in this interval not displayed.   Basic Metabolic Panel Recent Labs    65/53/74 0309 05/08/23 0333 05/08/23 1626  05/09/23 0021  NA  --  131*  --  133*  K  --  3.9  --  3.9  CL  --  93*  --  93*  CO2  --  27  --  31  GLUCOSE  --  132*  --  110*  BUN  --  36*  --  30*  CREATININE  --  1.63*  --  1.33*  CALCIUM   --  8.9  --  8.7*  MG 2.2  --  1.9 2.0  PHOS 4.4  --   --  3.4   Liver Function Tests Recent Labs    05/07/23 1102  AST 35  ALT 27  ALKPHOS 122  BILITOT 2.3*  PROT 8.3*  ALBUMIN 3.1*   BNP: BNP (last 3 results) Recent Labs    09/16/22 1145 12/31/22 0904 05/06/23 1603  BNP 1,157.1* 1,296.0* 1,671.0*   Hemoglobin A1C Recent Labs    05/08/23 0309  HGBA1C 6.0*   Fasting Lipid Panel Recent Labs    05/08/23 0309  CHOL 75  HDL 22*  LDLCALC 46  TRIG 35  CHOLHDL 3.4   Imaging   No  results found.  Medications:    Scheduled Medications:  atorvastatin   20 mg Oral Daily   Chlorhexidine  Gluconate Cloth  6 each Topical Daily   insulin  aspart  0-15 Units Subcutaneous Q4H   pantoprazole   40 mg Oral Daily   potassium chloride   40 mEq Oral Q4H   sodium chloride  flush  10-40 mL Intracatheter Q12H   sodium chloride  flush  10-40 mL Intracatheter Q12H    Infusions:  DOBUTamine  5 mcg/kg/min (05/09/23 0700)   furosemide  (LASIX ) 200 mg in dextrose  5 % 100 mL (2 mg/mL) infusion 30 mg/hr (05/09/23 0700)   norepinephrine  (LEVOPHED ) Adult infusion 7 mcg/min (05/09/23 0700)    PRN Medications: acetaminophen  **OR** acetaminophen , albuterol , guaiFENesin -dextromethorphan , ondansetron  **OR** ondansetron  (ZOFRAN ) IV, polyethylene glycol, sodium chloride  flush, sodium chloride  flush  Patient Profile   [name] [name] is a 48 y.o. 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 [facility]mitted to [facility] with A/C HFrEF with BiV dysfunction. Went into cardiogenic shock and was transferred to [facility].   Assessment/Plan   Acute on  chronic systolic HF with biventricualr dysfunction   Cardiogenic shock - LHC in 2023 with nonobstructive CAD.  - RHC 9/24 RA 5 PA 53/21 (33) PCWP 21 Fick 4.6/2.0 TD 5.0/2.24 - Echo 4/25 EF 20-25% severe RV dysfunction with septal flattening Mod-severe MR - Coox 71% CVP 16-17. On DBA 5 and NE 7. Continue NE to maintain MAP >= 70, wean as able - Great UOP yesterday. Continue Lasix  30/hr. Repeat BMET this afternoon if CO2 elevated, will give dose diamox  - Not ideal canddiate for VAD due to RV dysfunction. Not transplant candidate for transplant due to size and noncompliance. Body mass index is 43.68 kg/m.   2. Severe MR - TEE confirming severe functional MR. Will discuss possibility for TEER with structural. - Current echo mod/sev MR - May need to reassess valve after diuresis, likely overload contributing to severity   3. AKI: baseline sCr 1.1 - due to ATN shock - continue hemodynamic support - SCr improving with diuresis 1.63>1.33   4. Hx of PE/DVT w/ IVC filter - Submassive saddle PE extending from the distal main pulmonary artery and to the left and right pulmonary arteries with extension into the left upper lobe, left lower lobe and lingula branches and right upper lobe, right middle lobe and right lower lobes on 04/27/2013  - Evaluated at [facility] where thrombophilia work-up was negative (Lupus inhibitor, anticardiolipin antibodies, and anti beta-2  glycoprotein antibodies were negative; protein C activity 85%, protein S activity 76%, factor V Leiden, and prothrombin gene mutation ) - IVC filter in place; could not be removed due to significant thrombus burden on filter at [facility].  - Previously advised against DOACs due to severity of thrombus burden and weight >250lbs.  - Hold warfarin. Start heparin  gtt when INR <2. INR pending today   5. Morbid obesity  - Body mass index is 43.68 kg/m. - consider GLP1RA   6. CAD - LHC in 5/23 w/ nonobstructive CAD - Followed by general cardiology - No  s/s angina   7. Microcytic anemia - tSat 4, ferritin 16 - Will give IV Fe prior to dc  8. COPD/asthma - likely contributing to SOB - inspiratory wheezing noted on exam - schedule duonebs q6h  CRITICAL CARE Performed by: [name] [name]  Total critical care time: 14 minutes  Critical care time  was exclusive of separately billable procedures and treating other patients.  Critical care was necessary to treat or prevent imminent or life-threatening deterioration.  Critical care was time spent personally by me on the following activities: development of treatment plan with patient and/or surrogate as well as nursing, discussions with consultants, evaluation of patient's response to treatment, examination of patient, obtaining history from patient or surrogate, ordering and performing treatments and interventions, ordering and review of laboratory studies, ordering and review of radiographic studies, pulse oximetry and re-evaluation of patient's condition.    Length of Stay: 3  [name] [name], NP  05/09/2023, 7:21 AM  Advanced Heart Failure Team Pager [phone] (M-F; 7a - 5p)  Please contact [facility] for night-coverage after hours (5p -7a ) and weekends on [email or web address]  Agree with above.   Diuresing briskly on lasix  30 and DBA and NE. Down 10 pounds overnight. CVP still 17. Having severe cramps  General:  Sitting in chair Uncomfortable HEENT: normal Neck: supple. JVP to ear Carotids 2+ bilat; no bruits. No lymphadenopathy or thryomegaly appreciated. Cor: PMI nondisplaced. Regular rate & rhythm. 2/6 TR Lungs: clear Abdomen: obese soft, nontender, nondistended. No hepatosplenomegaly. No bruits or masses. Good bowel sounds. Extremities: no cyanosis, clubbing, rash, 1-2+ edema R>L +UNNA  Neuro: alert & orientedx3, cranial nerves grossly intact. moves all 4 extremities w/o difficulty. Affect pleasant  Remains volume overloaded. Continue IV lasix , DBA and NE. Will drop lasix  to 20 given  seveity of cramps. Can use flexeril  to help. Supp K   Not candidate for advanced therapies. Will wean DBA/NE once full diuresed/  INR pending. Start heparin  when INR < 2.0   Give IV iron  prior to d/c  CRITICAL CARE Performed by: Jules Oar  Total critical care time: 45 minutes  Critical care time was exclusive of separately billable procedures and treating other patients.  Critical care was necessary to treat or prevent imminent or life-threatening deterioration.  Critical care was time spent personally by me (independent of midlevel providers or residents) on the following activities: development of treatment plan with patient and/or surrogate as well as nursing, discussions with consultants, evaluation of patient's response to treatment, examination of patient, obtaining history from patient or surrogate, ordering and performing treatments and interventions, ordering and review of laboratory studies, ordering and review of radiographic studies, pulse oximetry and re-evaluation of patient's condition.  Jules Oar, MD  9:42 AM

## 2023-05-09 NOTE — Consult Note (Signed)
 PHARMACY - ANTICOAGULATION CONSULT NOTE  Pharmacy Consult for Warfarin Indication: history of pulmonary embolus and DVT  Allergies  Allergen Reactions   Sulfa  Antibiotics Itching, Swelling and Rash    Lip swelling    Patient Measurements: Height: 5\' 10"  (177.8 cm) Weight: (!) 138.1 kg (304 lb 7.3 oz) IBW/kg (Calculated) : 68.5 HEPARIN  DW (KG): 101.8  Vital Signs: Temp: 97.9 F (36.6 C) (04/18 0700) Temp Source: Oral (04/18 0700) BP: 125/77 (04/18 0650) Pulse Rate: 86 (04/18 0700)  Labs: Recent Labs    05/06/23 1329 05/06/23 1603 05/07/23 0028 05/07/23 1102 05/07/23 1701 05/07/23 1910 05/07/23 2108 05/08/23 0309 05/08/23 0333 05/08/23 0528 05/09/23 0021  HGB 9.2*  --   --   --  8.7*  --   --  8.5*  --   --  8.8*  HCT 33.6*  --   --   --  32.1*  --   --  30.6*  --   --  31.5*  PLT 186  --   --   --  169  --   --  171  --   --  178  LABPROT  --  23.1*   < >  --   --   --  26.7* 27.5*  --  29.9*  --   INR  --  2.0*   < >  --   --   --  2.4* 2.5*  --  2.8*  --   CREATININE 1.06*  --    < >  --   --  1.69*  --   --  1.63*  --  1.33*  TROPONINIHS 44* 48*  --  41*  --   --   --   --   --   --   --    < > = values in this interval not displayed.    Estimated Creatinine Clearance: 80.4 mL/min (A) (by C-G formula based on SCr of 1.33 mg/dL (H)).   Medical History: Past Medical History:  Diagnosis Date   Anemia    Blood transfusion without reported diagnosis    CAD (coronary artery disease)    Nonobstructive   Closed left ankle fracture    August 30 2012   Clotting disorder (HCC)    DVT (deep venous thrombosis) (HCC)    L leg   Fibroid tumor    HFrEF (heart failure with reduced ejection fraction) (HCC)    Hidradenitis    Hypertension    Mitral regurgitation    Myocardial infarction (HCC)    NICM (nonischemic cardiomyopathy) (HCC)    NSVT (nonsustained ventricular tachycardia) (HCC)    Obesity    OSA on CPAP    Pulmonary embolism (HCC) 04/2013   Sleep  apnea     Medications:   Assessment: 48 y.o. female with medical history significant for systolic CHF, hypertension, DVT and PE on chronic anticoagulation with warfarin, IVC status, OSA on CPAP presented to the ED with complaints of increasing difficulty breathing, and swelling to her bilateral lower extremities up to her thighs that started 2 days ago. She reports her baseline weight is about 274 pounds, she is weighing 299 here in the ED. She has had to sleep sitting up. Pharmacy has been consulted to manage warfarin while admitted. Last dose warfarin (15 mg) 05/07/23 PM.  Of note: patient's home warfarin regimen recently changed(per anticoagulation clinic note on 04/30/2023) from 10mg  daily to 15mg  on Wednesday and Saturdays, and 10mg  all other days.  INR is  elevated at 5.3, recheck at 4.0, after receiving booster dose of 15 mg 4/16. Plan to hold warfarin for now with no reversal. Plan to initiate heparin  infusion once INR <2.0. CBC is stable. No signs of bleeding per RN.  Goal of Therapy:  INR 2-3 Monitor platelets by anticoagulation protocol: Yes  Plan:  Hold warfarin Once INR <2.0, initiation UFH infusion INR/CBC daily Monitor for S/S of bleeding  Albino Alu, PharmD PGY2 Cardiology Pharmacy Resident 05/09/2023,8:08 AM

## 2023-05-09 NOTE — Plan of Care (Signed)
 Diuresing well. Lungs clear and diminished in bases. CVP now 17-18. Able to ambulate today without getting SOB. Continues to have a cough-Robitussin given for cough.

## 2023-05-09 NOTE — Plan of Care (Signed)

## 2023-05-09 NOTE — Progress Notes (Signed)
   05/09/23 2228  BiPAP/CPAP/SIPAP  BiPAP/CPAP/SIPAP Pt Type Adult  BiPAP/CPAP/SIPAP Resmed  Mask Type Full face mask  Mask Size Medium  Respiratory Rate 18 breaths/min  EPAP 10 cmH2O  FiO2 (%) 36 %  Flow Rate 4 lpm  Patient Home Machine No  Patient Home Mask No  Patient Home Tubing No  Auto Titrate No  BiPAP/CPAP /SiPAP Vitals  Pulse Rate 86  Resp 18  SpO2 99 %  Bilateral Breath Sounds Clear;Diminished  MEWS Score/Color  MEWS Score 0  MEWS Score Color Marrie Sizer

## 2023-05-10 DIAGNOSIS — E876 Hypokalemia: Secondary | ICD-10-CM | POA: Diagnosis not present

## 2023-05-10 DIAGNOSIS — I5023 Acute on chronic systolic (congestive) heart failure: Secondary | ICD-10-CM | POA: Diagnosis not present

## 2023-05-10 DIAGNOSIS — N179 Acute kidney failure, unspecified: Secondary | ICD-10-CM | POA: Diagnosis not present

## 2023-05-10 LAB — BASIC METABOLIC PANEL WITH GFR
Anion gap: 10 (ref 5–15)
BUN: 21 mg/dL — ABNORMAL HIGH (ref 6–20)
CO2: 37 mmol/L — ABNORMAL HIGH (ref 22–32)
Calcium: 8.7 mg/dL — ABNORMAL LOW (ref 8.9–10.3)
Chloride: 88 mmol/L — ABNORMAL LOW (ref 98–111)
Creatinine, Ser: 1.1 mg/dL — ABNORMAL HIGH (ref 0.44–1.00)
GFR, Estimated: >60 mL/min (ref >60)
Glucose, Bld: 151 mg/dL — ABNORMAL HIGH (ref 70–99)
Potassium: 3.4 mmol/L — ABNORMAL LOW (ref 3.5–5.1)
Sodium: 135 mmol/L (ref 135–145)

## 2023-05-10 LAB — GLUCOSE, CAPILLARY
Glucose-Capillary: 100 mg/dL — ABNORMAL HIGH (ref 70–99)
Glucose-Capillary: 113 mg/dL — ABNORMAL HIGH (ref 70–99)
Glucose-Capillary: 131 mg/dL — ABNORMAL HIGH (ref 70–99)
Glucose-Capillary: 149 mg/dL — ABNORMAL HIGH (ref 70–99)
Glucose-Capillary: 152 mg/dL — ABNORMAL HIGH (ref 70–99)
Glucose-Capillary: 98 mg/dL (ref 70–99)

## 2023-05-10 LAB — CBC
HCT: 31.4 % — ABNORMAL LOW (ref 36.0–46.0)
Hemoglobin: 8.5 g/dL — ABNORMAL LOW (ref 12.0–15.0)
MCH: 21 pg — ABNORMAL LOW (ref 26.0–34.0)
MCHC: 27.1 g/dL — ABNORMAL LOW (ref 30.0–36.0)
MCV: 77.5 fL — ABNORMAL LOW (ref 80.0–100.0)
Platelets: 146 10*3/uL — ABNORMAL LOW (ref 150–400)
RBC: 4.05 MIL/uL (ref 3.87–5.11)
RDW: 22.8 % — ABNORMAL HIGH (ref 11.5–15.5)
WBC: 5.3 10*3/uL (ref 4.0–10.5)
nRBC: 0.4 % — ABNORMAL HIGH (ref 0.0–0.2)

## 2023-05-10 LAB — PROTIME-INR
INR: 3 — ABNORMAL HIGH (ref 0.8–1.2)
Prothrombin Time: 31.4 s — ABNORMAL HIGH (ref 11.4–15.2)

## 2023-05-10 LAB — MAGNESIUM: Magnesium: 1.7 mg/dL (ref 1.7–2.4)

## 2023-05-10 LAB — PHOSPHORUS: Phosphorus: 2.7 mg/dL (ref 2.5–4.6)

## 2023-05-10 MED ORDER — POTASSIUM CHLORIDE CRYS ER 20 MEQ PO TBCR
40.0000 meq | EXTENDED_RELEASE_TABLET | ORAL | Status: AC
  Administered 2023-05-10 (×2): 40 meq via ORAL
  Filled 2023-05-10 (×2): qty 2

## 2023-05-10 MED ORDER — MAGNESIUM SULFATE 4 GM/100ML IV SOLN
4.0000 g | Freq: Once | INTRAVENOUS | Status: AC
  Administered 2023-05-10: 4 g via INTRAVENOUS
  Filled 2023-05-10: qty 100

## 2023-05-10 MED ORDER — IPRATROPIUM-ALBUTEROL 0.5-2.5 (3) MG/3ML IN SOLN
3.0000 mL | Freq: Three times a day (TID) | RESPIRATORY_TRACT | Status: DC
  Administered 2023-05-10 – 2023-05-11 (×2): 3 mL via RESPIRATORY_TRACT
  Filled 2023-05-10 (×2): qty 3

## 2023-05-10 MED ORDER — WARFARIN SODIUM 5 MG PO TABS
10.0000 mg | ORAL_TABLET | Freq: Once | ORAL | Status: AC
  Administered 2023-05-10: 10 mg via ORAL
  Filled 2023-05-10: qty 2

## 2023-05-10 MED ORDER — WARFARIN - PHARMACIST DOSING INPATIENT: Freq: Every day | Status: DC

## 2023-05-10 MED ORDER — CARMEX CLASSIC LIP BALM EX OINT
TOPICAL_OINTMENT | CUTANEOUS | Status: DC | PRN
  Filled 2023-05-10: qty 10

## 2023-05-10 NOTE — Plan of Care (Signed)

## 2023-05-10 NOTE — Consult Note (Addendum)
 PHARMACY - ANTICOAGULATION CONSULT NOTE  Pharmacy Consult for Warfarin Indication: history of pulmonary embolus and DVT  Allergies  Allergen Reactions   Sulfa  Antibiotics Itching, Swelling and Rash    Lip swelling    Patient Measurements: Height: 5\' 10"  (177.8 cm) Weight: 132.3 kg (291 lb 10.7 oz) IBW/kg (Calculated) : 68.5 HEPARIN  DW (KG): 101.8  Vital Signs: Temp: 97.7 F (36.5 C) (04/19 1113) Temp Source: Oral (04/19 1113) BP: 112/66 (04/19 1200) Pulse Rate: 85 (04/19 1200)  Labs: Recent Labs    05/08/23 0309 05/08/23 0333 05/09/23 0021 05/09/23 0722 05/09/23 1028 05/09/23 1403 05/10/23 0417 05/10/23 1145  HGB 8.5*  --  8.8*  --   --   --   --  8.5*  HCT 30.6*  --  31.5*  --   --   --   --  31.4*  PLT 171  --  178  --   --   --   --  146*  LABPROT 27.5*   < >  --  48.9* 38.9*  --  31.4*  --   INR 2.5*   < >  --  5.3* 4.0*  --  3.0*  --   CREATININE  --    < > 1.33*  --  1.13* 1.24* 1.10*  --    < > = values in this interval not displayed.    Estimated Creatinine Clearance: 94.8 mL/min (A) (by C-G formula based on SCr of 1.1 mg/dL (H)).   Medical History: Past Medical History:  Diagnosis Date   Anemia    Blood transfusion without reported diagnosis    CAD (coronary artery disease)    Nonobstructive   Closed left ankle fracture    August 30 2012   Clotting disorder (HCC)    DVT (deep venous thrombosis) (HCC)    L leg   Fibroid tumor    HFrEF (heart failure with reduced ejection fraction) (HCC)    Hidradenitis    Hypertension    Mitral regurgitation    Myocardial infarction (HCC)    NICM (nonischemic cardiomyopathy) (HCC)    NSVT (nonsustained ventricular tachycardia) (HCC)    Obesity    OSA on CPAP    Pulmonary embolism (HCC) 04/2013   Sleep apnea     Medications:   Assessment: 48 y.o. female with medical history significant for systolic CHF, hypertension, DVT and PE on chronic anticoagulation with warfarin, IVC status, OSA on CPAP  presented to the ED with complaints of increasing difficulty breathing, and swelling to her bilateral lower extremities up to her thighs that started 2 days ago. She reports her baseline weight is about 274 pounds, she is weighing 299 here in the ED. She has had to sleep sitting up. Pharmacy has been consulted to manage warfarin while admitted.   -PTA warfarin regimen = 15 mg Wed/Sat, 10 mg AOD (recently increased on 4/9 w/ INR 1.7 on 10 mg daily) -INR 5.3 on admit (not reversed), last warfarin dose 15 mg on 4/16 PM (after boosted dose of 15 mg on 4/15 from PTA regimen)  OK to restart warfarin per HF - INR 3.0 today (down from 4.0 yesterday) after holding warfarin x2d.  Anticipate INR will continue to fall.  Hgb 8.5, pltc 146 - stable.   Goal of Therapy:  INR 2-3 Monitor platelets by anticoagulation protocol: Yes  Plan:  Give warfarin 10 mg x 1 (restarting PTA regimen) INR/CBC daily Monitor for S/S of bleeding  Cecillia Cogan, PharmD Clinical Pharmacist 05/10/2023  1:31 PM

## 2023-05-10 NOTE — Progress Notes (Signed)
 Patient is self sufficient with CPAP machine and will place on when ready.

## 2023-05-10 NOTE — *Deleted (Incomplete)
 [email or web address]

## 2023-05-10 NOTE — Progress Notes (Signed)
 Advanced Heart Failure Rounding Note  Cardiologist: Armida Lander, MD  Chief Complaint: Cardiogenic shock Subjective:     On DBA 5. NE weaned off this am  Diuresing briskly. Out another 10L overnight on lasix  gtt  Still having some cramps. Denies SOB.   CVP 11-12 Co-ox pending   Scr 1.1 K 3.4  INR 3.0   Objective:    Weight Range: 132.3 kg Body mass index is 41.85 kg/m.   Vital Signs:   Temp:  [96.3 F (35.7 C)-98.2 F (36.8 C)] 98.2 F (36.8 C) (04/19 0756) Pulse Rate:  [71-103] 82 (04/19 1015) Resp:  [14-25] 23 (04/19 1015) BP: (68-142)/(42-96) 96/77 (04/19 1015) SpO2:  [89 %-100 %] 98 % (04/19 1015) FiO2 (%):  [36 %] 36 % (04/18 2228) Weight:  [132.3 kg] 132.3 kg (04/19 0600) Last BM Date : 05/09/23  Weight change: Filed Weights   05/08/23 0500 05/09/23 0500 05/10/23 0600  Weight: (!) 143.3 kg (!) 138.1 kg 132.3 kg   Intake/Output:  Intake/Output Summary (Last 24 hours) at 05/10/2023 1055 Last data filed at 05/10/2023 1000 Gross per 24 hour  Intake 1477.76 ml  Output 9525 ml  Net -8047.24 ml    Physical Exam    General:  Obese woman sitting up in chair No resp difficulty HEENT: normal Neck: supple. nJVP to jaw  Cor: Regular rate & rhythm. No rubs, gallops or murmurs. Lungs: clear Abdomen: obese soft, nontender, nondistended. No hepatosplenomegaly. No bruits or masses. Good bowel sounds. Extremities: no cyanosis, clubbing, rash, 1+ edema  R>L + UNNA Neuro: alert & orientedx3, cranial nerves grossly intact. moves all 4 extremities w/o difficulty. Affect pleasant   Telemetry   Sinus 80s with some PVCs Personally reviewed  Labs    CBC Recent Labs    05/08/23 0309 05/09/23 0021  WBC 4.3 5.4  HGB 8.5* 8.8*  HCT 30.6* 31.5*  MCV 77.1* 76.8*  PLT 171 178   Basic Metabolic Panel Recent Labs    66/44/03 0021 05/09/23 1028 05/09/23 1403 05/10/23 0417  NA 133*   < > 134* 135  K 3.9   < > 3.4* 3.4*  CL 93*   < > 94* 88*  CO2 31   <  > 34* 37*  GLUCOSE 110*   < > 114* 151*  BUN 30*   < > 27* 21*  CREATININE 1.33*   < > 1.24* 1.10*  CALCIUM  8.7*   < > 8.5* 8.7*  MG 2.0  --   --  1.7  PHOS 3.4  --   --  2.7   < > = values in this interval not displayed.   Liver Function Tests Recent Labs    05/07/23 1102  AST 35  ALT 27  ALKPHOS 122  BILITOT 2.3*  PROT 8.3*  ALBUMIN 3.1*   BNP: BNP (last 3 results) Recent Labs    09/16/22 1145 12/31/22 0904 05/06/23 1603  BNP 1,157.1* 1,296.0* 1,671.0*   Hemoglobin A1C Recent Labs    05/08/23 0309  HGBA1C 6.0*   Fasting Lipid Panel Recent Labs    05/08/23 0309  CHOL 75  HDL 22*  LDLCALC 46  TRIG 35  CHOLHDL 3.4   Imaging   No results found.  Medications:    Scheduled Medications:  atorvastatin   20 mg Oral Daily   Chlorhexidine  Gluconate Cloth  6 each Topical Daily   insulin  aspart  0-15 Units Subcutaneous Q4H   ipratropium-albuterol   3 mL Nebulization QID   pantoprazole   40 mg Oral Daily   sodium chloride  flush  10-40 mL Intracatheter Q12H   sodium chloride  flush  10-40 mL Intracatheter Q12H    Infusions:  DOBUTamine  5 mcg/kg/min (05/10/23 1000)   furosemide  (LASIX ) 200 mg in dextrose  5 % 100 mL (2 mg/mL) infusion 20 mg/hr (05/10/23 1000)   norepinephrine  (LEVOPHED ) Adult infusion Stopped (05/09/23 2001)    PRN Medications: acetaminophen  **OR** acetaminophen , cyclobenzaprine , guaiFENesin -dextromethorphan , lip balm, ondansetron  **OR** ondansetron  (ZOFRAN ) IV, polyethylene glycol, sodium chloride  flush, sodium chloride  flush  Patient Profile   [name] [name] is a 48 y.o. 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 Admitted to [facility] with A/C HFrEF with BiV dysfunction. Went into cardiogenic shock and was transferred to [facility].   Assessment/Plan   Acute on chronic systolic HF  with biventricualr dysfunction   Cardiogenic shock - LHC in 2023 with nonobstructive CAD.  - RHC 9/24 RA 5 PA 53/21 (33) PCWP 21 Fick 4.6/2.0 TD 5.0/2.24 - Echo 4/25 EF 20-25% severe RV dysfunction with septal flattening Mod-severe MR - Diuresing briskly on lasix  gtt. CVP still 11-12 - Continue DBA and IV lasix  at least one more day.  - Now off NE. Can restart as needed. Has been on midodrine  at home - Not ideal canddiate for VAD due to RV dysfunction. Not transplant candidate for transplant due to size and noncompliance. Body mass index is 41.85 kg/m.   2. Severe MR - TEE confirming severe functional MR. Will discuss possibility for TEER with structural. - Current echo mod/sev MR - May need to reassess valve after diuresis, likely overload contributing to severity   3. AKI: baseline sCr 1.1 - due to ATN shock - continue hemodynamic support - esolved - SCr improving with diuresis 1.63>1.33 -> 1.1   4. Hx of PE/DVT w/ IVC filter - Submassive saddle PE extending from the distal main pulmonary artery and to the left and right pulmonary arteries with extension into the left upper lobe, left lower lobe and lingula branches and right upper lobe, right middle lobe and right lower lobes on 04/27/2013  - Evaluated at [facility] where thrombophilia work-up was negative (Lupus inhibitor, anticardiolipin antibodies, and anti beta-2  glycoprotein antibodies were negative; protein C activity 85%, protein S activity 76%, factor V Leiden, and prothrombin gene mutation ) - IVC filter in place; could not be removed due to significant thrombus burden on filter at [facility].  - Previously advised against DOACs due to severity of thrombus burden and weight >250lbs.  - INR 3.0. Can restart warfarin as shock has resolved and I don't foresee a role for any major invasive procedures (can do RHC as needed).   5. Morbid obesity  - Body mass index is 41.85 kg/m. - consider GLP1RA   6. CAD - LHC in 5/23 w/ nonobstructive  CAD - No s/s angina   7. Microcytic anemia - tSat 4, ferritin 16 - Will give IV Fe prior to dc  8. Hypokalemia - supp  CRITICAL CARE Performed by: Esaiah Wanless  Total critical care time:38 minutes  Critical care time was exclusive of separately billable procedures and treating other patients.  Critical care was necessary to treat or prevent imminent or life-threatening deterioration.  Critical care was time spent personally by me (independent of midlevel providers or residents) on the following activities: development of treatment plan with patient and/or surrogate as well  as nursing, discussions with consultants, evaluation of patient's response to treatment, examination of patient, obtaining history from patient or surrogate, ordering and performing treatments and interventions, ordering and review of laboratory studies, ordering and review of radiographic studies, pulse oximetry and re-evaluation of patient's condition.  Length of Stay: 4  Jules Oar, MD  05/10/2023, 10:55 AM  Advanced Heart Failure Team Pager [phone] (M-F; 7a - 5p)  Please contact [facility] for night-coverage after hours (5p -7a ) and weekends on [email or web address]  10:55 AM

## 2023-05-11 DIAGNOSIS — I5023 Acute on chronic systolic (congestive) heart failure: Secondary | ICD-10-CM | POA: Diagnosis not present

## 2023-05-11 DIAGNOSIS — E876 Hypokalemia: Secondary | ICD-10-CM | POA: Diagnosis not present

## 2023-05-11 LAB — CBC
HCT: 29.1 % — ABNORMAL LOW (ref 36.0–46.0)
Hemoglobin: 8.2 g/dL — ABNORMAL LOW (ref 12.0–15.0)
MCH: 21.4 pg — ABNORMAL LOW (ref 26.0–34.0)
MCHC: 28.2 g/dL — ABNORMAL LOW (ref 30.0–36.0)
MCV: 76 fL — ABNORMAL LOW (ref 80.0–100.0)
Platelets: 132 10*3/uL — ABNORMAL LOW (ref 150–400)
RBC: 3.83 MIL/uL — ABNORMAL LOW (ref 3.87–5.11)
RDW: 22.9 % — ABNORMAL HIGH (ref 11.5–15.5)
WBC: 4.6 10*3/uL (ref 4.0–10.5)
nRBC: 0 % (ref 0.0–0.2)

## 2023-05-11 LAB — COMPREHENSIVE METABOLIC PANEL WITH GFR
ALT: 20 U/L (ref 0–44)
AST: 29 U/L (ref 15–41)
Albumin: 2.6 g/dL — ABNORMAL LOW (ref 3.5–5.0)
Alkaline Phosphatase: 103 U/L (ref 38–126)
Anion gap: 11 (ref 5–15)
BUN: 14 mg/dL (ref 6–20)
CO2: 37 mmol/L — ABNORMAL HIGH (ref 22–32)
Calcium: 8.3 mg/dL — ABNORMAL LOW (ref 8.9–10.3)
Chloride: 87 mmol/L — ABNORMAL LOW (ref 98–111)
Creatinine, Ser: 0.93 mg/dL (ref 0.44–1.00)
GFR, Estimated: >60 mL/min (ref >60)
Glucose, Bld: 114 mg/dL — ABNORMAL HIGH (ref 70–99)
Potassium: 3.5 mmol/L (ref 3.5–5.1)
Sodium: 135 mmol/L (ref 135–145)
Total Bilirubin: 1.7 mg/dL — ABNORMAL HIGH (ref 0.0–1.2)
Total Protein: 7.4 g/dL (ref 6.5–8.1)

## 2023-05-11 LAB — COOXEMETRY PANEL
Carboxyhemoglobin: 2.2 % — ABNORMAL HIGH (ref 0.5–1.5)
Methemoglobin: <0.7 % (ref 0.0–1.5)
O2 Saturation: 67.7 %
Total hemoglobin: 8 g/dL — ABNORMAL LOW (ref 12.0–16.0)

## 2023-05-11 LAB — GLUCOSE, CAPILLARY
Glucose-Capillary: 81 mg/dL (ref 70–99)
Glucose-Capillary: 106 mg/dL — ABNORMAL HIGH (ref 70–99)
Glucose-Capillary: 116 mg/dL — ABNORMAL HIGH (ref 70–99)
Glucose-Capillary: 118 mg/dL — ABNORMAL HIGH (ref 70–99)
Glucose-Capillary: 96 mg/dL (ref 70–99)
Glucose-Capillary: 96 mg/dL (ref 70–99)

## 2023-05-11 LAB — BASIC METABOLIC PANEL WITH GFR
Anion gap: 8 (ref 5–15)
BUN: 16 mg/dL (ref 6–20)
CO2: 39 mmol/L — ABNORMAL HIGH (ref 22–32)
Calcium: 8.3 mg/dL — ABNORMAL LOW (ref 8.9–10.3)
Chloride: 85 mmol/L — ABNORMAL LOW (ref 98–111)
Creatinine, Ser: 0.93 mg/dL (ref 0.44–1.00)
GFR, Estimated: >60 mL/min (ref >60)
Glucose, Bld: 155 mg/dL — ABNORMAL HIGH (ref 70–99)
Potassium: 3.2 mmol/L — ABNORMAL LOW (ref 3.5–5.1)
Sodium: 132 mmol/L — ABNORMAL LOW (ref 135–145)

## 2023-05-11 LAB — PROTIME-INR
INR: 2.7 — ABNORMAL HIGH (ref 0.8–1.2)
Prothrombin Time: 28.9 s — ABNORMAL HIGH (ref 11.4–15.2)

## 2023-05-11 LAB — PHOSPHORUS
Phosphorus: 2.2 mg/dL — ABNORMAL LOW (ref 2.5–4.6)
Phosphorus: 2.4 mg/dL — ABNORMAL LOW (ref 2.5–4.6)

## 2023-05-11 LAB — MAGNESIUM
Magnesium: 2.1 mg/dL (ref 1.7–2.4)
Magnesium: 2 mg/dL (ref 1.7–2.4)

## 2023-05-11 MED ORDER — POTASSIUM CHLORIDE CRYS ER 20 MEQ PO TBCR
40.0000 meq | EXTENDED_RELEASE_TABLET | ORAL | Status: AC
Start: 2023-05-11 — End: 2023-05-11
  Administered 2023-05-11 (×2): 40 meq via ORAL
  Filled 2023-05-11 (×2): qty 2

## 2023-05-11 MED ORDER — IPRATROPIUM-ALBUTEROL 0.5-2.5 (3) MG/3ML IN SOLN
3.0000 mL | Freq: Two times a day (BID) | RESPIRATORY_TRACT | Status: DC
  Administered 2023-05-11 – 2023-05-15 (×7): 3 mL via RESPIRATORY_TRACT
  Filled 2023-05-11 (×7): qty 3

## 2023-05-11 MED ORDER — POTASSIUM CHLORIDE 10 MEQ/50ML IV SOLN
10.0000 meq | INTRAVENOUS | Status: AC
  Administered 2023-05-11 (×2): 10 meq via INTRAVENOUS
  Filled 2023-05-11: qty 50

## 2023-05-11 MED ORDER — WARFARIN SODIUM 5 MG PO TABS
10.0000 mg | ORAL_TABLET | Freq: Once | ORAL | Status: AC
  Administered 2023-05-11: 10 mg via ORAL
  Filled 2023-05-11: qty 2

## 2023-05-11 MED ORDER — POTASSIUM CHLORIDE 10 MEQ/100ML IV SOLN: 10.0000 meq | INTRAVENOUS | Status: DC

## 2023-05-11 MED ORDER — ACETAZOLAMIDE 250 MG PO TABS
250.0000 mg | ORAL_TABLET | Freq: Two times a day (BID) | ORAL | Status: AC
  Administered 2023-05-11 (×2): 250 mg via ORAL
  Filled 2023-05-11 (×2): qty 1

## 2023-05-11 MED ORDER — POTASSIUM & SODIUM PHOSPHATES 280-160-250 MG PO PACK
2.0000 | PACK | ORAL | Status: AC
  Administered 2023-05-11 (×3): 2 via ORAL
  Filled 2023-05-11 (×3): qty 2

## 2023-05-11 MED ORDER — BENZONATATE 100 MG PO CAPS
200.0000 mg | ORAL_CAPSULE | Freq: Three times a day (TID) | ORAL | Status: DC
  Administered 2023-05-11 – 2023-05-16 (×15): 200 mg via ORAL
  Filled 2023-05-11 (×15): qty 2

## 2023-05-11 MED ORDER — SPIRONOLACTONE 25 MG PO TABS
25.0000 mg | ORAL_TABLET | Freq: Every day | ORAL | Status: DC
  Administered 2023-05-11: 25 mg via ORAL
  Filled 2023-05-11 (×2): qty 1

## 2023-05-11 NOTE — Progress Notes (Signed)
 Advanced Heart Failure Rounding Note  Cardiologist: Armida Lander, MD  Chief Complaint: Cardiogenic shock Subjective:     On DBA 5. Off NE  Co-ox 68% Scr 0/8 K 3.5  Diuresing briskly on lasix  gtt at 20 (turned down from 30 due to cramps). Weight inaccurate  Denies SOB. Still with some cramps  CVP 17  Objective:    Weight Range: 135.8 kg Body mass index is 42.96 kg/m.   Vital Signs:   Temp:  [97.7 F (36.5 C)-98.3 F (36.8 C)] 97.9 F (36.6 C) (04/20 1106) Pulse Rate:  [77-99] 88 (04/20 1000) Resp:  [16-26] 22 (04/20 1000) BP: (98-128)/(58-91) 115/65 (04/20 1000) SpO2:  [86 %-100 %] 98 % (04/20 1000) Weight:  [135.8 kg] 135.8 kg (04/20 0600) Last BM Date : 05/10/23  Weight change: Filed Weights   05/09/23 0500 05/10/23 0600 05/11/23 0600  Weight: (!) 138.1 kg 132.3 kg 135.8 kg   Intake/Output:  Intake/Output Summary (Last 24 hours) at 05/11/2023 1212 Last data filed at 05/11/2023 1100 Gross per 24 hour  Intake 991.09 ml  Output 5850 ml  Net -4858.91 ml    Physical Exam    General:  Sitting up in bed No resp difficulty HEENT: normal Neck: supple. To ear.  Cor: Regular rate & rhythm. 2/6 TR Lungs: clear Abdomen: obese soft, nontender, nondistended. No hepatosplenomegaly. No bruits or masses. Good bowel sounds. Extremities: no cyanosis, clubbing, rash,1+  edema R>L + UNNA Neuro: alert & orientedx3, cranial nerves grossly intact. moves all 4 extremities w/o difficulty. Affect pleasant   Telemetry   Sinus 80-90 with some PVCs Personally reviewed  Labs    CBC Recent Labs    05/10/23 1145 05/11/23 0121  WBC 5.3 4.6  HGB 8.5* 8.2*  HCT 31.4* 29.1*  MCV 77.5* 76.0*  PLT 146* 132*   Basic Metabolic Panel Recent Labs    16/10/96 0121 05/11/23 1009  NA 132* 135  K 3.2* 3.5  CL 85* 87*  CO2 39* 37*  GLUCOSE 155* 114*  BUN 16 14  CREATININE 0.93 0.93  CALCIUM  8.3* 8.3*  MG 2.1 2.0  PHOS 2.2* 2.4*   Liver Function Tests Recent Labs     05/11/23 1009  AST 29  ALT 20  ALKPHOS 103  BILITOT 1.7*  PROT 7.4  ALBUMIN 2.6*   BNP: BNP (last 3 results) Recent Labs    09/16/22 1145 12/31/22 0904 05/06/23 1603  BNP 1,157.1* 1,296.0* 1,671.0*   Hemoglobin A1C No results for input(s): "HGBA1C" in the last 72 hours.  Fasting Lipid Panel No results for input(s): "CHOL", "HDL", "LDLCALC", "TRIG", "CHOLHDL", "LDLDIRECT" in the last 72 hours.  Imaging   No results found.  Medications:    Scheduled Medications:  acetaZOLAMIDE   250 mg Oral BID   atorvastatin   20 mg Oral Daily   Chlorhexidine  Gluconate Cloth  6 each Topical Daily   insulin  aspart  0-15 Units Subcutaneous Q4H   ipratropium-albuterol   3 mL Nebulization TID   pantoprazole   40 mg Oral Daily   sodium chloride  flush  10-40 mL Intracatheter Q12H   sodium chloride  flush  10-40 mL Intracatheter Q12H   Warfarin - Pharmacist Dosing Inpatient   Does not apply q1600    Infusions:  DOBUTamine  5 mcg/kg/min (05/11/23 1100)   furosemide  (LASIX ) 200 mg in dextrose  5 % 100 mL (2 mg/mL) infusion 20 mg/hr (05/11/23 1100)   norepinephrine  (LEVOPHED ) Adult infusion Stopped (05/09/23 2001)    PRN Medications: acetaminophen  **OR** acetaminophen , cyclobenzaprine , guaiFENesin -dextromethorphan , lip  balm, ondansetron  **OR** ondansetron  (ZOFRAN ) IV, polyethylene glycol, sodium chloride  flush, sodium chloride  flush  Patient Profile   [name] [name] is a 48 y.o. 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 Admitted to [facility] ICU with A/C HFrEF with BiV dysfunction. Went into cardiogenic shock and was transferred to [facility].   Assessment/Plan   Acute on chronic systolic HF with biventricualr dysfunction   Cardiogenic shock - LHC in 2023 with nonobstructive CAD.  - RHC 9/24 RA 5 PA 53/21 (33) PCWP 21 Fick 4.6/2.0 TD  5.0/2.24 - Echo 4/25 EF 20-25% severe RV dysfunction with septal flattening Mod-severe MR - Diuresing briskly on lasix  gtt. CVP still high - Continue DBA and IV lasix . Add diamox  250 bid for elevated bicarb - Now off NE. Can restart as needed. Has been on midodrine  at home. Can restart as needed - Not ideal canddiate for VAD due to RV dysfunction. Not transplant candidate for transplant due to size and noncompliance. Body mass index is 42.96 kg/m.   2. Severe MR - TEE confirming severe functional MR. Will discuss possibility for TEER with structural. - Current echo mod/sev MR - May need to reassess valve after diuresis, likely overload contributing to severity - No change   3. AKI: baseline sCr 1.1 - due to ATN shock - continue hemodynamic support - esolved - SCr improving with diuresis 1.63>1.33 -> 1.1 -> 0.93   4. Hx of PE/DVT w/ IVC filter - Submassive saddle PE extending from the distal main pulmonary artery and to the left and right pulmonary arteries with extension into the left upper lobe, left lower lobe and lingula branches and right upper lobe, right middle lobe and right lower lobes on 04/27/2013  - Evaluated at [facility] where thrombophilia work-up was negative (Lupus inhibitor, anticardiolipin antibodies, and anti beta-2  glycoprotein antibodies were negative; protein C activity 85%, protein S activity 76%, factor V Leiden, and prothrombin gene mutation ) - IVC filter in place; could not be removed due to significant thrombus burden on filter at [facility].  - Previously advised against DOACs due to severity of thrombus burden and weight >250lbs.  - INR 2.7 . Can restart warfarin as shock has resolved and I don't foresee a role for any major invasive procedures (can do RHC as needed). Discussed warfarin dosing with PharmD personally.   5. Morbid obesity  - Body mass index is 42.96 kg/m. - consider GLP1RA   6. CAD - LHC in 5/23 w/ nonobstructive CAD - No s/s angina   7. Microcytic  anemia - tSat 4, ferritin 16 - Will give IV Fe prior to dc  8. Hypokalemia - supp - will add spiro 25 while in house  Length of Stay: 5  Jules Oar, MD  05/11/2023, 12:12 PM  Advanced Heart Failure Team Pager [phone] (M-F; 7a - 5p)  Please contact [facility] for night-coverage after hours (5p -7a ) and weekends on [email or web address]  12:12 PM

## 2023-05-11 NOTE — Plan of Care (Signed)

## 2023-05-11 NOTE — Progress Notes (Signed)
 Patient is self sufficient with CPAP machine and will place on when ready. Will tell RN to contact RT if she needs assistance.

## 2023-05-11 NOTE — Consult Note (Signed)
 PHARMACY - ANTICOAGULATION CONSULT NOTE  Pharmacy Consult for Warfarin Indication: history of pulmonary embolus and DVT  Allergies  Allergen Reactions   Sulfa  Antibiotics Itching, Swelling and Rash    Lip swelling    Patient Measurements: Height: 5\' 10"  (177.8 cm) Weight: 135.8 kg (299 lb 6.2 oz) IBW/kg (Calculated) : 68.5 HEPARIN  DW (KG): 101.8  Vital Signs: Temp: 97.9 F (36.6 C) (04/20 1106) Temp Source: Oral (04/20 1106) BP: 115/65 (04/20 1000) Pulse Rate: 88 (04/20 1000)  Labs: Recent Labs    05/09/23 0021 05/09/23 0722 05/09/23 1028 05/09/23 1403 05/10/23 0417 05/10/23 1145 05/11/23 0121 05/11/23 1009  HGB 8.8*  --   --   --   --  8.5* 8.2*  --   HCT 31.5*  --   --   --   --  31.4* 29.1*  --   PLT 178  --   --   --   --  146* 132*  --   LABPROT  --    < > 38.9*  --  31.4*  --  28.9*  --   INR  --    < > 4.0*  --  3.0*  --  2.7*  --   CREATININE 1.33*  --  1.13*   < > 1.10*  --  0.93 0.93   < > = values in this interval not displayed.    Estimated Creatinine Clearance: 113.8 mL/min (by C-G formula based on SCr of 0.93 mg/dL).   Medical History: Past Medical History:  Diagnosis Date   Anemia    Blood transfusion without reported diagnosis    CAD (coronary artery disease)    Nonobstructive   Closed left ankle fracture    August 30 2012   Clotting disorder (HCC)    DVT (deep venous thrombosis) (HCC)    L leg   Fibroid tumor    HFrEF (heart failure with reduced ejection fraction) (HCC)    Hidradenitis    Hypertension    Mitral regurgitation    Myocardial infarction (HCC)    NICM (nonischemic cardiomyopathy) (HCC)    NSVT (nonsustained ventricular tachycardia) (HCC)    Obesity    OSA on CPAP    Pulmonary embolism (HCC) 04/2013   Sleep apnea     Medications: see MAR  Assessment: 48 y.o. female with medical history significant for systolic CHF, hypertension, DVT and PE on chronic anticoagulation with warfarin, IVC status, OSA on CPAP presented  to the ED with complaints of increasing difficulty breathing, and swelling to her bilateral lower extremities up to her thighs that started 2 days ago. She reports her baseline weight is about 274 pounds, she is weighing 299 here in the ED. She has had to sleep sitting up. Pharmacy has been consulted to manage warfarin while admitted.   PTA warfarin regimen = 15 mg Wed/Sat, 10 mg AOD (recently increased on 4/9 w/ INR 1.7 on 10 mg daily) -INR 5.3 on admit (not reversed), last warfarin dose 15 mg on 4/16 PM (after boosted dose of 15 mg on 4/15 from PTA regimen)  -Warfarin restarted 4/19 -INR 2.7 today, Hgb 8.2, pltc 132.  No s/sx bleeding.  Good appetite, eating all meals.  Goal of Therapy:  INR 2-3 Monitor platelets by anticoagulation protocol: Yes  Plan:  Give warfarin 10 mg x 1 (restarting PTA regimen) INR/CBC daily Monitor for S/S of bleeding  Cecillia Cogan, PharmD Clinical Pharmacist 05/11/2023  12:21 PM

## 2023-05-12 ENCOUNTER — Other Ambulatory Visit: Payer: Self-pay

## 2023-05-12 ENCOUNTER — Inpatient Hospital Stay (HOSPITAL_COMMUNITY)

## 2023-05-12 DIAGNOSIS — I5043 Acute on chronic combined systolic (congestive) and diastolic (congestive) heart failure: Secondary | ICD-10-CM | POA: Diagnosis not present

## 2023-05-12 LAB — COOXEMETRY PANEL
Carboxyhemoglobin: 1.8 % — ABNORMAL HIGH (ref 0.5–1.5)
Carboxyhemoglobin: 1.5 % (ref 0.5–1.5)
Carboxyhemoglobin: 1.9 % — ABNORMAL HIGH (ref 0.5–1.5)
Methemoglobin: <0.7 % (ref 0.0–1.5)
Methemoglobin: <0.7 % (ref 0.0–1.5)
Methemoglobin: 0.7 % (ref 0.0–1.5)
O2 Saturation: 49.6 %
O2 Saturation: 44.2 %
O2 Saturation: 45.9 %
Total hemoglobin: 9.5 g/dL — ABNORMAL LOW (ref 12.0–16.0)
Total hemoglobin: 9 g/dL — ABNORMAL LOW (ref 12.0–16.0)
Total hemoglobin: 9.2 g/dL — ABNORMAL LOW (ref 12.0–16.0)

## 2023-05-12 LAB — GLUCOSE, CAPILLARY
Glucose-Capillary: 90 mg/dL (ref 70–99)
Glucose-Capillary: 100 mg/dL — ABNORMAL HIGH (ref 70–99)
Glucose-Capillary: 87 mg/dL (ref 70–99)
Glucose-Capillary: 158 mg/dL — ABNORMAL HIGH (ref 70–99)
Glucose-Capillary: 101 mg/dL — ABNORMAL HIGH (ref 70–99)
Glucose-Capillary: 128 mg/dL — ABNORMAL HIGH (ref 70–99)
Glucose-Capillary: 111 mg/dL — ABNORMAL HIGH (ref 70–99)

## 2023-05-12 LAB — PHOSPHORUS: Phosphorus: 2.7 mg/dL (ref 2.5–4.6)

## 2023-05-12 LAB — BASIC METABOLIC PANEL WITH GFR
Anion gap: 9 (ref 5–15)
Anion gap: 9 (ref 5–15)
BUN: 14 mg/dL (ref 6–20)
BUN: 13 mg/dL (ref 6–20)
CO2: 38 mmol/L — ABNORMAL HIGH (ref 22–32)
CO2: 34 mmol/L — ABNORMAL HIGH (ref 22–32)
Calcium: 8.4 mg/dL — ABNORMAL LOW (ref 8.9–10.3)
Calcium: 8.4 mg/dL — ABNORMAL LOW (ref 8.9–10.3)
Chloride: 87 mmol/L — ABNORMAL LOW (ref 98–111)
Chloride: 88 mmol/L — ABNORMAL LOW (ref 98–111)
Creatinine, Ser: 0.97 mg/dL (ref 0.44–1.00)
Creatinine, Ser: 1.06 mg/dL — ABNORMAL HIGH (ref 0.44–1.00)
GFR, Estimated: >60 mL/min (ref >60)
GFR, Estimated: >60 mL/min (ref >60)
Glucose, Bld: 101 mg/dL — ABNORMAL HIGH (ref 70–99)
Glucose, Bld: 199 mg/dL — ABNORMAL HIGH (ref 70–99)
Potassium: 3.1 mmol/L — ABNORMAL LOW (ref 3.5–5.1)
Potassium: 3.6 mmol/L (ref 3.5–5.1)
Sodium: 134 mmol/L — ABNORMAL LOW (ref 135–145)
Sodium: 131 mmol/L — ABNORMAL LOW (ref 135–145)

## 2023-05-12 LAB — CBC
HCT: 31.7 % — ABNORMAL LOW (ref 36.0–46.0)
Hemoglobin: 8.8 g/dL — ABNORMAL LOW (ref 12.0–15.0)
MCH: 21.1 pg — ABNORMAL LOW (ref 26.0–34.0)
MCHC: 27.8 g/dL — ABNORMAL LOW (ref 30.0–36.0)
MCV: 76 fL — ABNORMAL LOW (ref 80.0–100.0)
Platelets: 149 10*3/uL — ABNORMAL LOW (ref 150–400)
RBC: 4.17 MIL/uL (ref 3.87–5.11)
RDW: 23.1 % — ABNORMAL HIGH (ref 11.5–15.5)
WBC: 4.5 10*3/uL (ref 4.0–10.5)
nRBC: 0 % (ref 0.0–0.2)

## 2023-05-12 LAB — MAGNESIUM: Magnesium: 2.1 mg/dL (ref 1.7–2.4)

## 2023-05-12 LAB — CG4 I-STAT (LACTIC ACID)
Lactic Acid, Venous: 0.9 mmol/L (ref 0.5–1.9)
Lactic Acid, Venous: 0.9 mmol/L (ref 0.5–1.9)

## 2023-05-12 LAB — PROTIME-INR
INR: 2.7 — ABNORMAL HIGH (ref 0.8–1.2)
Prothrombin Time: 29.1 s — ABNORMAL HIGH (ref 11.4–15.2)

## 2023-05-12 MED ORDER — METHOCARBAMOL 1000 MG/10ML IJ SOLN
500.0000 mg | Freq: Once | INTRAMUSCULAR | Status: AC
  Administered 2023-05-12: 500 mg via INTRAVENOUS
  Filled 2023-05-12: qty 10

## 2023-05-12 MED ORDER — SPIRONOLACTONE 25 MG PO TABS
50.0000 mg | ORAL_TABLET | Freq: Every day | ORAL | Status: DC
  Administered 2023-05-12 – 2023-05-16 (×5): 50 mg via ORAL
  Filled 2023-05-12 (×5): qty 2

## 2023-05-12 MED ORDER — SODIUM CHLORIDE 0.9 % IV SOLN: INTRAVENOUS | Status: DC

## 2023-05-12 MED ORDER — SENNOSIDES-DOCUSATE SODIUM 8.6-50 MG PO TABS
2.0000 | ORAL_TABLET | Freq: Every day | ORAL | Status: DC
  Administered 2023-05-12 – 2023-05-14 (×3): 2 via ORAL
  Filled 2023-05-12 (×3): qty 2

## 2023-05-12 MED ORDER — MORPHINE SULFATE 10 MG/5ML PO SOLN: 5.0000 mg | ORAL | Status: DC | PRN

## 2023-05-12 MED ORDER — DIGOXIN 125 MCG PO TABS
0.1250 mg | ORAL_TABLET | Freq: Every day | ORAL | Status: DC
  Administered 2023-05-12 – 2023-05-16 (×5): 0.125 mg via ORAL
  Filled 2023-05-12 (×5): qty 1

## 2023-05-12 MED ORDER — ASPIRIN 81 MG PO CHEW: 81.0000 mg | CHEWABLE_TABLET | ORAL | Status: DC

## 2023-05-12 MED ORDER — ACETAZOLAMIDE 250 MG PO TABS
500.0000 mg | ORAL_TABLET | Freq: Two times a day (BID) | ORAL | Status: AC
  Administered 2023-05-12 (×2): 500 mg via ORAL
  Filled 2023-05-12 (×2): qty 2

## 2023-05-12 MED ORDER — TRAMADOL HCL 50 MG PO TABS
50.0000 mg | ORAL_TABLET | Freq: Four times a day (QID) | ORAL | Status: DC | PRN
  Administered 2023-05-12 – 2023-05-13 (×2): 100 mg via ORAL
  Administered 2023-05-14: 50 mg via ORAL
  Administered 2023-05-14 – 2023-05-16 (×3): 100 mg via ORAL
  Filled 2023-05-12 (×2): qty 2
  Filled 2023-05-12: qty 1
  Filled 2023-05-12 (×3): qty 2

## 2023-05-12 MED ORDER — MORPHINE SULFATE (CONCENTRATE) 10 MG /0.5 ML PO SOLN
5.0000 mg | ORAL | Status: DC | PRN
  Administered 2023-05-12: 5 mg via ORAL
  Filled 2023-05-12 (×2): qty 0.5

## 2023-05-12 MED ORDER — WARFARIN SODIUM 5 MG PO TABS
10.0000 mg | ORAL_TABLET | Freq: Once | ORAL | Status: DC
  Filled 2023-05-12: qty 2

## 2023-05-12 MED ORDER — POTASSIUM CHLORIDE CRYS ER 20 MEQ PO TBCR
40.0000 meq | EXTENDED_RELEASE_TABLET | ORAL | Status: AC
  Administered 2023-05-12 (×4): 40 meq via ORAL
  Filled 2023-05-12 (×4): qty 2

## 2023-05-12 NOTE — Consult Note (Signed)
 PHARMACY - ANTICOAGULATION CONSULT NOTE  Pharmacy Consult for Warfarin Indication: history of pulmonary embolus and DVT  Allergies  Allergen Reactions   Sulfa  Antibiotics Itching, Swelling and Rash    Lip swelling    Patient Measurements: Height: 5\' 10"  (177.8 cm) Weight: 127.9 kg (281 lb 15.5 oz) IBW/kg (Calculated) : 68.5 HEPARIN  DW (KG): 101.8  Vital Signs: Temp: 98.2 F (36.8 C) (04/21 0328) Temp Source: Oral (04/21 0328) BP: 113/78 (04/21 0600) Pulse Rate: 100 (04/21 0600)  Labs: Recent Labs    05/10/23 0417 05/10/23 1145 05/10/23 1145 05/11/23 0121 05/11/23 1009 05/12/23 0523  HGB  --  8.5*   < > 8.2*  --  8.8*  HCT  --  31.4*  --  29.1*  --  31.7*  PLT  --  146*  --  132*  --  149*  LABPROT 31.4*  --   --  28.9*  --  29.1*  INR 3.0*  --   --  2.7*  --  2.7*  CREATININE 1.10*  --   --  0.93 0.93 0.97   < > = values in this interval not displayed.    Estimated Creatinine Clearance: 105.6 mL/min (by C-G formula based on SCr of 0.97 mg/dL).   Medical History: Past Medical History:  Diagnosis Date   Anemia    Blood transfusion without reported diagnosis    CAD (coronary artery disease)    Nonobstructive   Closed left ankle fracture    August 30 2012   Clotting disorder (HCC)    DVT (deep venous thrombosis) (HCC)    L leg   Fibroid tumor    HFrEF (heart failure with reduced ejection fraction) (HCC)    Hidradenitis    Hypertension    Mitral regurgitation    Myocardial infarction (HCC)    NICM (nonischemic cardiomyopathy) (HCC)    NSVT (nonsustained ventricular tachycardia) (HCC)    Obesity    OSA on CPAP    Pulmonary embolism (HCC) 04/2013   Sleep apnea     Medications: see MAR  Assessment: 48 y.o. female with medical history significant for systolic CHF, hypertension, DVT and PE on chronic anticoagulation with warfarin, IVC status, OSA on CPAP presented to the ED with complaints of increasing difficulty breathing, and swelling to her  bilateral lower extremities up to her thighs that started 2 days ago. She reports her baseline weight is about 274 pounds, she is weighing 299 here in the ED. She has had to sleep sitting up. Pharmacy has been consulted to manage warfarin while admitted.   PTA warfarin regimen = 15 mg Wed/Sat, 10 mg AOD (recently increased on 4/9 w/ INR 1.7 on 10 mg daily) -INR 5.3 on admit (not reversed), last warfarin dose 15 mg on 4/16 PM (after boosted dose of 15 mg on 4/15 from PTA regimen)  -Warfarin restarted 4/19 -INR 2.7 today, CBC stable.  No s/sx bleeding.  Good appetite, eating all meals.  Goal of Therapy:  INR 2-3 Monitor platelets by anticoagulation protocol: Yes  Plan:  Give warfarin 10 mg x 1 (restarting PTA regimen) INR/CBC daily Monitor for S/S of bleeding  Albino Alu, PharmD PGY2 Cardiology Pharmacy Resident 05/12/2023  7:22 AM

## 2023-05-12 NOTE — H&P (View-Only) (Signed)
 Advanced Heart Failure Rounding Note  Cardiologist: Armida Lander, MD   Chief Complaint: Cardiogenic shock  Subjective:    Remains on 5 DBA. CO-OX 49.6% with estimated Fick CI of 2. CVP 17.  6.7L UOP last 24 hrs with lasix  gtt at 20/hr + diamox  250 BID.   Feels a little better but still has orthopnea and cough.    Objective:    Weight Range: 127.9 kg Body mass index is 40.46 kg/m.   Vital Signs:   Temp:  [97.8 F (36.6 C)-98.2 F (36.8 C)] 98.2 F (36.8 C) (04/21 0328) Pulse Rate:  [81-100] 87 (04/21 0700) Resp:  [15-25] 22 (04/21 0700) BP: (106-152)/(63-116) 107/78 (04/21 0700) SpO2:  [84 %-100 %] 93 % (04/21 0700) Weight:  [127.9 kg] 127.9 kg (04/21 0500) Last BM Date : 05/10/23  Weight change: Filed Weights   05/10/23 0600 05/11/23 0600 05/12/23 0500  Weight: 132.3 kg 135.8 kg 127.9 kg   Intake/Output:  Intake/Output Summary (Last 24 hours) at 05/12/2023 0750 Last data filed at 05/12/2023 0700 Gross per 24 hour  Intake 730.02 ml  Output 6775 ml  Net -6044.98 ml    Physical Exam    General:  Fatigued appearing. Sitting up in bed. Neck: JVP to jaw Cor: Regular rate & rhythm. No rubs, gallops or murmurs. Lungs: clear Abdomen: obese, soft, nontender, nondistended Extremities: 2+ edema Neuro: alert & orientedx3. Affect pleasant    Telemetry   SR 80s-90s  Labs    CBC Recent Labs    05/11/23 0121 05/12/23 0523  WBC 4.6 4.5  HGB 8.2* 8.8*  HCT 29.1* 31.7*  MCV 76.0* 76.0*  PLT 132* 149*   Basic Metabolic Panel Recent Labs    16/10/96 1009 05/12/23 0523  NA 135 134*  K 3.5 3.1*  CL 87* 87*  CO2 37* 38*  GLUCOSE 114* 101*  BUN 14 14  CREATININE 0.93 0.97  CALCIUM  8.3* 8.4*  MG 2.0 2.1  PHOS 2.4* 2.7   Liver Function Tests Recent Labs    05/11/23 1009  AST 29  ALT 20  ALKPHOS 103  BILITOT 1.7*  PROT 7.4  ALBUMIN 2.6*   BNP: BNP (last 3 results) Recent Labs    09/16/22 1145 12/31/22 0904 05/06/23 1603  BNP  1,157.1* 1,296.0* 1,671.0*   Hemoglobin A1C No results for input(s): "HGBA1C" in the last 72 hours.  Fasting Lipid Panel No results for input(s): "CHOL", "HDL", "LDLCALC", "TRIG", "CHOLHDL", "LDLDIRECT" in the last 72 hours.  Imaging   No results found.  Medications:    Scheduled Medications:  atorvastatin   20 mg Oral Daily   benzonatate   200 mg Oral TID   Chlorhexidine  Gluconate Cloth  6 each Topical Daily   insulin  aspart  0-15 Units Subcutaneous Q4H   ipratropium-albuterol   3 mL Nebulization BID   pantoprazole   40 mg Oral Daily   potassium chloride   40 mEq Oral Q4H   sodium chloride  flush  10-40 mL Intracatheter Q12H   sodium chloride  flush  10-40 mL Intracatheter Q12H   spironolactone   25 mg Oral Daily   warfarin  10 mg Oral ONCE-1600   Warfarin - Pharmacist Dosing Inpatient   Does not apply q1600    Infusions:  DOBUTamine  5 mcg/kg/min (05/12/23 0600)   furosemide  (LASIX ) 200 mg in dextrose  5 % 100 mL (2 mg/mL) infusion 20 mg/hr (05/12/23 0600)   norepinephrine  (LEVOPHED ) Adult infusion Stopped (05/09/23 2001)    PRN Medications: acetaminophen  **OR** acetaminophen , cyclobenzaprine , guaiFENesin -dextromethorphan , lip balm, ondansetron  **  OR** ondansetron  (ZOFRAN ) IV, polyethylene glycol, sodium chloride  flush, sodium chloride  flush  Patient Profile   [name] [name] is a 48 y.o. 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 Admitted to [facility] ICU with A/C HFrEF with BiV dysfunction. Went into cardiogenic shock and was transferred to [facility].   Assessment/Plan   Acute on chronic systolic HF with biventricualr dysfunction   Cardiogenic shock - LHC in 2023 with nonobstructive CAD.  - RHC 9/24 RA 5 PA 53/21 (33) PCWP 21 Fick 4.6/2.0 TD 5.0/2.24 - Echo 4/25 EF 20-25% severe RV dysfunction with septal flattening  Mod-severe MR - CO-OX 49.6% with Fick CI of 2 on 5 DBA. CO-OX had been upper 60s - low 70s days prior. Recheck pending.  - Continue lasix  gtt at 20/hr and give 500 mg diamox  po BID - Now off NE. Has been on midodrine  at home. MAP currently stable. - Increasing spiro to 50, see below - No beta blocker with shock - SGLT2i once foley out, hopefully can get foley out soon if can get OOB frequently enough with aggressive diuresis - Not ideal canddiate for VAD due to RV dysfunction. Not transplant candidate for transplant due to size and noncompliance. Body mass index is 40.46 kg/m.   2. Severe MR - TEE confirming severe functional MR. Will discuss possibility for TEER with structural. - Current echo mod/sev MR - May need to reassess valve after diuresis, likely overload contributing to severity - No change   3. AKI:  - due to ATN shock - continue hemodynamic support - Baseline Scr 1 - resolved  4. Hx of PE/DVT w/ IVC filter - Submassive saddle PE extending from the distal main pulmonary artery and to the left and right pulmonary arteries with extension into the left upper lobe, left lower lobe and lingula branches and right upper lobe, right middle lobe and right lower lobes on 04/27/2013  - Evaluated at [facility] where thrombophilia work-up was negative (Lupus inhibitor, anticardiolipin antibodies, and anti beta-2  glycoprotein antibodies were negative; protein C activity 85%, protein S activity 76%, factor V Leiden, and prothrombin gene mutation ) - IVC filter in place; could not be removed due to significant thrombus burden on filter at [facility].  - Previously advised against DOACs due to severity of thrombus burden and weight >250lbs.  - INR 2.7. Back on Warfarin as shock has resolved and no further procedures planned at this time.    5. Morbid obesity  - Body mass index is 40.46 kg/m. - consider GLP1RA   6. CAD - LHC in 5/23 w/ nonobstructive CAD - No s/s angina   7. Microcytic anemia - tSat  4, ferritin 16 - Will give IV Fe prior to dc  8. Hypokalemia - Supp aggressively with diuresis - Increase spiro to 50 mg daily  CRITICAL CARE Performed by: Gerilyn Kobus N   Total critical care time: 13 minutes  Critical care time was exclusive of separately billable procedures and treating other patients.  Critical care was necessary to treat or prevent imminent or life-threatening deterioration.  Critical care was time spent personally by me on the following activities: development of treatment plan with patient and/or surrogate as well as nursing, discussions with consultants, evaluation of patient's response to treatment, examination of patient, obtaining history from patient or surrogate, ordering and performing treatments and interventions, ordering and review of laboratory  studies, ordering and review of radiographic studies, pulse oximetry and re-evaluation of patient's condition.   Length of Stay: 6  Glady Ouderkirk N, PA-C  05/12/2023, 7:50 AM  Advanced Heart Failure Team Pager [phone] (M-F; 7a - 5p)  Please contact [facility] for night-coverage after hours (5p -7a ) and weekends on [email or web address]  7:50 AM

## 2023-05-12 NOTE — Progress Notes (Addendum)
 CO-OX 45.9% with Fick CI 1.9 this afternoon on 5 DBA. Lactic acid okay.  More short of breath w/ persistent cough. Currently dyspneic at rest despite brisk diuresis.    Discussed with Dr. Alease Amend. DBA increased to 7.5 mcg/kg/min.  Will arrange for RHC tomorrow am to reassess hemodynamics. May need to consider VAD this admission if we can optimize her RV.

## 2023-05-12 NOTE — Progress Notes (Addendum)
 Advanced Heart Failure Rounding Note  Cardiologist: Armida Lander, MD   Chief Complaint: Cardiogenic shock  Subjective:    Remains on 5 DBA. CO-OX 49.6% with estimated Fick CI of 2. CVP 17.  6.7L UOP last 24 hrs with lasix  gtt at 20/hr + diamox  250 BID.   Feels a little better but still has orthopnea and cough.    Objective:    Weight Range: 127.9 kg Body mass index is 40.46 kg/m.   Vital Signs:   Temp:  [97.8 F (36.6 C)-98.2 F (36.8 C)] 98.2 F (36.8 C) (04/21 0328) Pulse Rate:  [81-100] 87 (04/21 0700) Resp:  [15-25] 22 (04/21 0700) BP: (106-152)/(63-116) 107/78 (04/21 0700) SpO2:  [84 %-100 %] 93 % (04/21 0700) Weight:  [127.9 kg] 127.9 kg (04/21 0500) Last BM Date : 05/10/23  Weight change: Filed Weights   05/10/23 0600 05/11/23 0600 05/12/23 0500  Weight: 132.3 kg 135.8 kg 127.9 kg   Intake/Output:  Intake/Output Summary (Last 24 hours) at 05/12/2023 0750 Last data filed at 05/12/2023 0700 Gross per 24 hour  Intake 730.02 ml  Output 6775 ml  Net -6044.98 ml    Physical Exam    General:  Fatigued appearing. Sitting up in bed. Neck: JVP to jaw Cor: Regular rate & rhythm. No rubs, gallops or murmurs. Lungs: clear Abdomen: obese, soft, nontender, nondistended Extremities: 2+ edema Neuro: alert & orientedx3. Affect pleasant    Telemetry   SR 80s-90s  Labs    CBC Recent Labs    05/11/23 0121 05/12/23 0523  WBC 4.6 4.5  HGB 8.2* 8.8*  HCT 29.1* 31.7*  MCV 76.0* 76.0*  PLT 132* 149*   Basic Metabolic Panel Recent Labs    16/10/96 1009 05/12/23 0523  NA 135 134*  K 3.5 3.1*  CL 87* 87*  CO2 37* 38*  GLUCOSE 114* 101*  BUN 14 14  CREATININE 0.93 0.97  CALCIUM  8.3* 8.4*  MG 2.0 2.1  PHOS 2.4* 2.7   Liver Function Tests Recent Labs    05/11/23 1009  AST 29  ALT 20  ALKPHOS 103  BILITOT 1.7*  PROT 7.4  ALBUMIN 2.6*   BNP: BNP (last 3 results) Recent Labs    09/16/22 1145 12/31/22 0904 05/06/23 1603  BNP  1,157.1* 1,296.0* 1,671.0*   Hemoglobin A1C No results for input(s): "HGBA1C" in the last 72 hours.  Fasting Lipid Panel No results for input(s): "CHOL", "HDL", "LDLCALC", "TRIG", "CHOLHDL", "LDLDIRECT" in the last 72 hours.  Imaging   No results found.  Medications:    Scheduled Medications:  atorvastatin   20 mg Oral Daily   benzonatate   200 mg Oral TID   Chlorhexidine  Gluconate Cloth  6 each Topical Daily   insulin  aspart  0-15 Units Subcutaneous Q4H   ipratropium-albuterol   3 mL Nebulization BID   pantoprazole   40 mg Oral Daily   potassium chloride   40 mEq Oral Q4H   sodium chloride  flush  10-40 mL Intracatheter Q12H   sodium chloride  flush  10-40 mL Intracatheter Q12H   spironolactone   25 mg Oral Daily   warfarin  10 mg Oral ONCE-1600   Warfarin - Pharmacist Dosing Inpatient   Does not apply q1600    Infusions:  DOBUTamine  5 mcg/kg/min (05/12/23 0600)   furosemide  (LASIX ) 200 mg in dextrose  5 % 100 mL (2 mg/mL) infusion 20 mg/hr (05/12/23 0600)   norepinephrine  (LEVOPHED ) Adult infusion Stopped (05/09/23 2001)    PRN Medications: acetaminophen  **OR** acetaminophen , cyclobenzaprine , guaiFENesin -dextromethorphan , lip balm, ondansetron  **  OR** ondansetron  (ZOFRAN ) IV, polyethylene glycol, sodium chloride  flush, sodium chloride  flush  Patient Profile   [name] [name] is a 48 y.o. 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 Admitted to [facility] with A/C HFrEF with BiV dysfunction. Went into cardiogenic shock and was transferred to [facility].   Assessment/Plan   Acute on chronic systolic HF with biventricualr dysfunction   Cardiogenic shock - LHC in 2023 with nonobstructive CAD.  - RHC 9/24 RA 5 PA 53/21 (33) PCWP 21 Fick 4.6/2.0 TD 5.0/2.24 - Echo 4/25 EF 20-25% severe RV dysfunction with septal flattening  Mod-severe MR - CO-OX 49.6% with Fick CI of 2 on 5 DBA. CO-OX had been upper 60s - low 70s days prior. Recheck pending.  - Continue lasix  gtt at 20/hr and give 500 mg diamox  po BID - Now off NE. Has been on midodrine  at home. MAP currently stable. - Increasing spiro to 50, see below - No beta blocker with shock - SGLT2i once foley out, hopefully can get foley out soon if can get OOB frequently enough with aggressive diuresis - Not ideal canddiate for VAD due to RV dysfunction. Not transplant candidate for transplant due to size and noncompliance. Body mass index is 40.46 kg/m.   2. Severe MR - TEE confirming severe functional MR. Will discuss possibility for TEER with structural. - Current echo mod/sev MR - May need to reassess valve after diuresis, likely overload contributing to severity - No change   3. AKI:  - due to ATN shock - continue hemodynamic support - Baseline Scr 1 - resolved  4. Hx of PE/DVT w/ IVC filter - Submassive saddle PE extending from the distal main pulmonary artery and to the left and right pulmonary arteries with extension into the left upper lobe, left lower lobe and lingula branches and right upper lobe, right middle lobe and right lower lobes on 04/27/2013  - Evaluated at [facility] where thrombophilia work-up was negative (Lupus inhibitor, anticardiolipin antibodies, and anti beta-2  glycoprotein antibodies were negative; protein C activity 85%, protein S activity 76%, factor V Leiden, and prothrombin gene mutation ) - IVC filter in place; could not be removed due to significant thrombus burden on filter at [facility].  - Previously advised against DOACs due to severity of thrombus burden and weight >250lbs.  - INR 2.7. Back on Warfarin as shock has resolved and no further procedures planned at this time.    5. Morbid obesity  - Body mass index is 40.46 kg/m. - consider GLP1RA   6. CAD - LHC in 5/23 w/ nonobstructive CAD - No s/s angina   7. Microcytic anemia - tSat  4, ferritin 16 - Will give IV Fe prior to dc  8. Hypokalemia - Supp aggressively with diuresis - Increase spiro to 50 mg daily  CRITICAL CARE Performed by: Gerilyn Kobus N   Total critical care time: 13 minutes  Critical care time was exclusive of separately billable procedures and treating other patients.  Critical care was necessary to treat or prevent imminent or life-threatening deterioration.  Critical care was time spent personally by me on the following activities: development of treatment plan with patient and/or surrogate as well as nursing, discussions with consultants, evaluation of patient's response to treatment, examination of patient, obtaining history from patient or surrogate, ordering and performing treatments and interventions, ordering and review of laboratory  studies, ordering and review of radiographic studies, pulse oximetry and re-evaluation of patient's condition.   Length of Stay: 6  Glady Ouderkirk N, PA-C  05/12/2023, 7:50 AM  Advanced Heart Failure Team Pager [phone] (M-F; 7a - 5p)  Please contact [facility] for night-coverage after hours (5p -7a ) and weekends on [email or web address]  7:50 AM

## 2023-05-12 NOTE — Progress Notes (Signed)
 Noted chest xray results on file. Secure chat to RN with VAS Team recommendation.

## 2023-05-12 NOTE — Progress Notes (Signed)
 Patient is self sufficient with CPAP machine at bedside and will place on when ready for bed.

## 2023-05-12 NOTE — Progress Notes (Signed)
   05/12/23 1615  Spiritual Encounters  Type of Visit Initial  Care provided to: Patient  Conversation partners present during encounter Nurse  Referral source Nurse (RN/NT/LPN)  Reason for visit Urgent spiritual support   Reason for Visit: Chaplain responding to page that Pt had received difficult news from the doctor and needed some support  Time of Visit: 30 Minutes  Description of Visit: Chaplain arrived in room to find Pt in the reclining chair, awake, and willing to speak with me. Chaplain facilitated story telling as Pt shared her recent medical trial and the difficult news the doctors shared regarding her heart.   Chaplain explored Pt's sources of emotional, spiritual, and relationship support and encouraged Pt to continue using those resources.  Pt's mother and immediate family provide much help with her physical needs and Pt states they are good with helping her process her emotions.   When chaplain explored spiritual resources, Pt expressed a [place] faith stating- "I don't go to church as much as I ought to, but I have a relationship."  Chaplain helped patient explore what her faith tradition says regarding church attendance.   Pt also expressed that prayer is something that helps her cope with her anxiety and sadness, but then states, "but my faith is small because I keep praying again and again because I don't trust."  Chaplain helped Pt explore the roots of this theology and helped her explore whether praying repeatedly really demonstrates a lack of faith or not.  Pt shared about her 102 y/o daughter Marianne Shirts whom she calls her "miracle baby." Pt spoke of having hard conversations with her soon.  Pt requested prayer and chaplain prayed with Pt.  Chaplain also taking Bible back to Pt's room  Plan of Care: This chaplain will follow up with Pt tomorrow afternoon and provide support for whatever doctors may conclude between now and then.  Chaplain services remain available by  Spiritual Consult or for emergent cases, paging [phone]  Chaplain Dewitte Foreman, MDiv [email or web address] [phone]

## 2023-05-13 ENCOUNTER — Encounter (HOSPITAL_COMMUNITY)
Admission: EM | Disposition: A | Discharge status: Home Health | Payer: Self-pay | Source: Home / Self Care | Attending: Cardiology

## 2023-05-13 ENCOUNTER — Encounter (HOSPITAL_COMMUNITY): Payer: Self-pay | Admitting: Cardiology

## 2023-05-13 ENCOUNTER — Inpatient Hospital Stay (HOSPITAL_COMMUNITY)

## 2023-05-13 DIAGNOSIS — I5043 Acute on chronic combined systolic (congestive) and diastolic (congestive) heart failure: Secondary | ICD-10-CM | POA: Diagnosis not present

## 2023-05-13 HISTORY — PX: RIGHT HEART CATH: CATH118263

## 2023-05-13 LAB — CBC
HCT: 31.4 % — ABNORMAL LOW (ref 36.0–46.0)
Hemoglobin: 8.5 g/dL — ABNORMAL LOW (ref 12.0–15.0)
MCH: 21 pg — ABNORMAL LOW (ref 26.0–34.0)
MCHC: 27.1 g/dL — ABNORMAL LOW (ref 30.0–36.0)
MCV: 77.5 fL — ABNORMAL LOW (ref 80.0–100.0)
Platelets: 161 10*3/uL (ref 150–400)
RBC: 4.05 MIL/uL (ref 3.87–5.11)
RDW: 23 % — ABNORMAL HIGH (ref 11.5–15.5)
WBC: 5 10*3/uL (ref 4.0–10.5)
nRBC: 0 % (ref 0.0–0.2)

## 2023-05-13 LAB — GLUCOSE, CAPILLARY
Glucose-Capillary: 179 mg/dL — ABNORMAL HIGH (ref 70–99)
Glucose-Capillary: 87 mg/dL (ref 70–99)
Glucose-Capillary: 75 mg/dL (ref 70–99)
Glucose-Capillary: 117 mg/dL — ABNORMAL HIGH (ref 70–99)

## 2023-05-13 LAB — MAGNESIUM
Magnesium: 2.2 mg/dL (ref 1.7–2.4)
Magnesium: 2.2 mg/dL (ref 1.7–2.4)

## 2023-05-13 LAB — BLOOD GAS, VENOUS
Acid-Base Excess: 11.6 mmol/L — ABNORMAL HIGH (ref 0.0–2.0)
Bicarbonate: 39.2 mmol/L — ABNORMAL HIGH (ref 20.0–28.0)
Drawn by: 4552
O2 Saturation: 71 %
Patient temperature: 37
pCO2, Ven: 71 mmHg (ref 44–60)
pH, Ven: 7.35 (ref 7.25–7.43)
pO2, Ven: 46 mmHg — ABNORMAL HIGH (ref 32–45)

## 2023-05-13 LAB — BASIC METABOLIC PANEL WITH GFR
Anion gap: 7 (ref 5–15)
Anion gap: 10 (ref 5–15)
BUN: 17 mg/dL (ref 6–20)
BUN: 14 mg/dL (ref 6–20)
CO2: 35 mmol/L — ABNORMAL HIGH (ref 22–32)
CO2: 34 mmol/L — ABNORMAL HIGH (ref 22–32)
Calcium: 8.6 mg/dL — ABNORMAL LOW (ref 8.9–10.3)
Calcium: 8.9 mg/dL (ref 8.9–10.3)
Chloride: 90 mmol/L — ABNORMAL LOW (ref 98–111)
Chloride: 90 mmol/L — ABNORMAL LOW (ref 98–111)
Creatinine, Ser: 1.09 mg/dL — ABNORMAL HIGH (ref 0.44–1.00)
Creatinine, Ser: 0.95 mg/dL (ref 0.44–1.00)
GFR, Estimated: >60 mL/min (ref >60)
GFR, Estimated: >60 mL/min (ref >60)
Glucose, Bld: 156 mg/dL — ABNORMAL HIGH (ref 70–99)
Glucose, Bld: 132 mg/dL — ABNORMAL HIGH (ref 70–99)
Potassium: 3.9 mmol/L (ref 3.5–5.1)
Potassium: 3.5 mmol/L (ref 3.5–5.1)
Sodium: 132 mmol/L — ABNORMAL LOW (ref 135–145)
Sodium: 134 mmol/L — ABNORMAL LOW (ref 135–145)

## 2023-05-13 LAB — COOXEMETRY PANEL
Carboxyhemoglobin: 2.1 % — ABNORMAL HIGH (ref 0.5–1.5)
Carboxyhemoglobin: 2.2 % — ABNORMAL HIGH (ref 0.5–1.5)
Carboxyhemoglobin: 2.2 % — ABNORMAL HIGH (ref 0.5–1.5)
Methemoglobin: <0.7 % (ref 0.0–1.5)
Methemoglobin: 0.7 % (ref 0.0–1.5)
Methemoglobin: <0.7 % (ref 0.0–1.5)
O2 Saturation: 74.4 %
O2 Saturation: 82.7 %
O2 Saturation: 70.2 %
Total hemoglobin: 8.8 g/dL — ABNORMAL LOW (ref 12.0–16.0)
Total hemoglobin: 8.5 g/dL — ABNORMAL LOW (ref 12.0–16.0)
Total hemoglobin: 9.5 g/dL — ABNORMAL LOW (ref 12.0–16.0)

## 2023-05-13 LAB — POCT I-STAT EG7
Acid-Base Excess: 8 mmol/L — ABNORMAL HIGH (ref 0.0–2.0)
Bicarbonate: 38 mmol/L — ABNORMAL HIGH (ref 20.0–28.0)
Calcium, Ion: 1.2 mmol/L (ref 1.15–1.40)
HCT: 34 % — ABNORMAL LOW (ref 36.0–46.0)
Hemoglobin: 11.6 g/dL — ABNORMAL LOW (ref 12.0–15.0)
O2 Saturation: 47 %
Potassium: 3.9 mmol/L (ref 3.5–5.1)
Sodium: 137 mmol/L (ref 135–145)
TCO2: 41 mmol/L — ABNORMAL HIGH (ref 22–32)
pCO2, Ven: 85.1 mmHg (ref 44–60)
pH, Ven: 7.258 (ref 7.25–7.43)
pO2, Ven: 31 mmHg — CL (ref 32–45)

## 2023-05-13 LAB — PHOSPHORUS: Phosphorus: 4.2 mg/dL (ref 2.5–4.6)

## 2023-05-13 LAB — PROTIME-INR
INR: 3.2 — ABNORMAL HIGH (ref 0.8–1.2)
Prothrombin Time: 32.7 s — ABNORMAL HIGH (ref 11.4–15.2)

## 2023-05-13 LAB — PREGNANCY, URINE: Preg Test, Ur: NEGATIVE

## 2023-05-13 SURGERY — RIGHT HEART CATH
Anesthesia: LOCAL

## 2023-05-13 MED ORDER — VERAPAMIL HCL 2.5 MG/ML IV SOLN
INTRAVENOUS | Status: AC
  Filled 2023-05-13: qty 2

## 2023-05-13 MED ORDER — ONDANSETRON HCL 4 MG/2ML IJ SOLN
INTRAMUSCULAR | Status: DC | PRN
Start: 2023-05-13 — End: 2023-05-13
  Administered 2023-05-13: 4 mg via INTRAVENOUS

## 2023-05-13 MED ORDER — SODIUM CHLORIDE 0.9 % IV SOLN
INTRAVENOUS | Status: AC | PRN
  Administered 2023-05-13: 10 mL/h via INTRAVENOUS

## 2023-05-13 MED ORDER — WARFARIN SODIUM 5 MG PO TABS
5.0000 mg | ORAL_TABLET | Freq: Once | ORAL | Status: AC
  Administered 2023-05-13: 5 mg via ORAL
  Filled 2023-05-13: qty 1

## 2023-05-13 MED ORDER — HEPARIN (PORCINE) IN NACL 1000-0.9 UT/500ML-% IV SOLN
INTRAVENOUS | Status: DC | PRN
  Administered 2023-05-13: 500 mL

## 2023-05-13 MED ORDER — METOLAZONE 5 MG PO TABS: 5.0000 mg | ORAL_TABLET | Freq: Once | ORAL | Status: DC

## 2023-05-13 MED ORDER — ACETAZOLAMIDE 250 MG PO TABS
500.0000 mg | ORAL_TABLET | Freq: Two times a day (BID) | ORAL | Status: AC
  Administered 2023-05-13 (×2): 500 mg via ORAL
  Filled 2023-05-13 (×2): qty 2

## 2023-05-13 MED ORDER — LIDOCAINE HCL (PF) 1 % IJ SOLN
INTRAMUSCULAR | Status: AC
  Filled 2023-05-13: qty 30

## 2023-05-13 MED ORDER — WARFARIN SODIUM 5 MG PO TABS: 5.0000 mg | ORAL_TABLET | Freq: Once | ORAL | Status: DC

## 2023-05-13 MED ORDER — MILRINONE LACTATE IN DEXTROSE 20-5 MG/100ML-% IV SOLN
0.2500 ug/kg/min | INTRAVENOUS | Status: DC
  Administered 2023-05-13 – 2023-05-16 (×8): 0.25 ug/kg/min via INTRAVENOUS
  Filled 2023-05-13 (×8): qty 100

## 2023-05-13 MED ORDER — MIDAZOLAM HCL 2 MG/2ML IJ SOLN
INTRAMUSCULAR | Status: AC
  Filled 2023-05-13: qty 2

## 2023-05-13 MED ORDER — ONDANSETRON HCL 4 MG/2ML IJ SOLN
INTRAMUSCULAR | Status: AC
  Filled 2023-05-13: qty 2

## 2023-05-13 MED ORDER — MIDAZOLAM HCL 2 MG/2ML IJ SOLN
INTRAMUSCULAR | Status: DC | PRN
  Administered 2023-05-13: 1 mg via INTRAVENOUS

## 2023-05-13 MED ORDER — POTASSIUM CHLORIDE CRYS ER 20 MEQ PO TBCR
40.0000 meq | EXTENDED_RELEASE_TABLET | ORAL | Status: AC
Start: 2023-05-13 — End: 2023-05-13
  Administered 2023-05-13 (×2): 40 meq via ORAL
  Filled 2023-05-13: qty 2

## 2023-05-13 MED ORDER — METOLAZONE 5 MG PO TABS
5.0000 mg | ORAL_TABLET | Freq: Two times a day (BID) | ORAL | Status: AC
  Administered 2023-05-13 (×2): 5 mg via ORAL
  Filled 2023-05-13 (×2): qty 1

## 2023-05-13 MED ORDER — LIDOCAINE HCL (PF) 1 % IJ SOLN
INTRAMUSCULAR | Status: DC | PRN
  Administered 2023-05-13: 2 mL via INTRADERMAL

## 2023-05-13 SURGICAL SUPPLY — 8 items
CATH SWAN GANZ 7F STRAIGHT (CATHETERS) IMPLANT
COVER PRB 48X5XTLSCP FOLD TPE (BAG) IMPLANT
KIT MICROPUNCTURE NIT STIFF (SHEATH) IMPLANT
MAT PREVALON FULL STRYKER (MISCELLANEOUS) IMPLANT
PACK CARDIAC CATHETERIZATION (CUSTOM PROCEDURE TRAY) ×1 IMPLANT
SHEATH PINNACLE 7F 10CM (SHEATH) IMPLANT
TRANSDUCER W/STOPCOCK (MISCELLANEOUS) IMPLANT
TUBING ART PRESS 72 MALE/FEM (TUBING) IMPLANT

## 2023-05-13 NOTE — Progress Notes (Signed)
 Patient refused CPAP last night and has had excessive somnolence throughout the day. I initially thought this was due to receiving versed  in the cath lab, however, lethargy has not improved. Patient able to easily wake up and take medications / asks for needs. CPAP placed on patient at this time to see if it will help.

## 2023-05-13 NOTE — Consult Note (Signed)
 PHARMACY - ANTICOAGULATION CONSULT NOTE  Pharmacy Consult for Warfarin Indication: history of pulmonary embolus and DVT  Allergies  Allergen Reactions   Sulfa  Antibiotics Itching, Swelling and Rash    Lip swelling    Patient Measurements: Height: 5\' 10"  (177.8 cm) Weight: 127 kg (279 lb 15.8 oz) IBW/kg (Calculated) : 68.5 HEPARIN  DW (KG): 101.8  Vital Signs: Temp: 97.7 F (36.5 C) (04/21 2330) Temp Source: Oral (04/21 2330) BP: 106/64 (04/22 0600) Pulse Rate: 82 (04/22 0600)  Labs: Recent Labs    05/11/23 0121 05/11/23 1009 05/12/23 0523 05/12/23 1338 05/13/23 0420  HGB 8.2*  --  8.8*  --  8.5*  HCT 29.1*  --  31.7*  --  31.4*  PLT 132*  --  149*  --  161  LABPROT 28.9*  --  29.1*  --  32.7*  INR 2.7*  --  2.7*  --  3.2*  CREATININE 0.93   < > 0.97 1.06* 1.09*   < > = values in this interval not displayed.    Estimated Creatinine Clearance: 93.6 mL/min (A) (by C-G formula based on SCr of 1.09 mg/dL (H)).   Medical History: Past Medical History:  Diagnosis Date   Anemia    Blood transfusion without reported diagnosis    CAD (coronary artery disease)    Nonobstructive   Closed left ankle fracture    August 30 2012   Clotting disorder (HCC)    DVT (deep venous thrombosis) (HCC)    L leg   Fibroid tumor    HFrEF (heart failure with reduced ejection fraction) (HCC)    Hidradenitis    Hypertension    Mitral regurgitation    Myocardial infarction (HCC)    NICM (nonischemic cardiomyopathy) (HCC)    NSVT (nonsustained ventricular tachycardia) (HCC)    Obesity    OSA on CPAP    Pulmonary embolism (HCC) 04/2013   Sleep apnea     Medications: see MAR  Assessment: 48 y.o. female with medical history significant for systolic CHF, hypertension, DVT and PE on chronic anticoagulation with warfarin, IVC status, OSA on CPAP presented to the ED with complaints of increasing difficulty breathing, and swelling to her bilateral lower extremities up to her thighs that  started 2 days ago. She reports her baseline weight is about 274 pounds, she is weighing 299 here in the ED. She has had to sleep sitting up. Pharmacy has been consulted to manage warfarin while admitted.   PTA warfarin regimen = 15 mg Wed/Sat, 10 mg AOD (recently increased on 4/9 w/ INR 1.7 on 10 mg daily) -INR 5.3 on admit (not reversed), last warfarin dose 15 mg on 4/16 PM (after boosted dose of 15 mg on 4/15 from PTA regimen)  -Warfarin restarted 4/19 -INR supratherapeutic at 3.2 (+0.5) today after dose of warfarin was held yesterday, CBC stable.  No s/sx bleeding.  Good appetite, eating all meals.  Goal of Therapy:  INR 2-3 Monitor platelets by anticoagulation protocol: Yes  Plan:  Give warfarin 5 mg x 1 INR/CBC daily Monitor for S/S of bleeding  Albino Alu, PharmD PGY2 Cardiology Pharmacy Resident 05/13/2023  6:18 AM

## 2023-05-13 NOTE — Interval H&P Note (Signed)
 History and Physical Interval Note:  05/13/2023 7:55 AM  [name] [name]  has presented today for surgery, with the diagnosis of heart failure.  The various methods of treatment have been discussed with the patient and family. After consideration of risks, benefits and other options for treatment, the patient has consented to  Procedure(s): RIGHT HEART CATH (N/A) as a surgical intervention.  The patient's history has been reviewed, patient examined, no change in status, stable for surgery.  I have reviewed the patient's chart and labs.  Questions were answered to the patient's satisfaction.     [name]

## 2023-05-13 NOTE — Progress Notes (Signed)
 Advanced Heart Failure Rounding Note  Cardiologist: Armida Lander, MD   Chief Complaint: Cardiogenic shock  Subjective:    Filling pressures remain significantly elevated on RHC today.   On 7.5 DBA with Fick CI 2.7 and TD CI 2.6 in cath lab.   Excellent diuresis yesterday with lasix  gtt at 20/hr + diamox  but had severe leg cramps.   Objective:    Weight Range: 127 kg Body mass index is 40.17 kg/m.   Vital Signs:   Temp:  [96.9 F (36.1 C)-97.7 F (36.5 C)] 97.7 F (36.5 C) (04/21 2330) Pulse Rate:  [82-100] 88 (04/22 1000) Resp:  [16-29] 21 (04/22 1000) BP: (95-126)/(52-91) 119/64 (04/22 1000) SpO2:  [76 %-100 %] 99 % (04/22 1000) Weight:  [295 kg] 127 kg (04/22 0434) Last BM Date : 05/11/23  Weight change: Filed Weights   05/12/23 0500 05/12/23 0814 05/13/23 0434  Weight: 127.9 kg 125.7 kg 127 kg   Intake/Output:  Intake/Output Summary (Last 24 hours) at 05/13/2023 1119 Last data filed at 05/13/2023 0900 Gross per 24 hour  Intake 1372.93 ml  Output 5600 ml  Net -4227.07 ml    Physical Exam    General:  Lying comfortably in bed. Neck: JVP to jaw Cor: Regular rate & rhythm. No rubs, gallops or murmurs. Lungs: breathing nonlabored Abdomen: obese, soft, nontender, nondistended.  Extremities: 2+ lower extremity edema Neuro: Sleeping but easily aroused.    Telemetry   SR 80s  Labs    CBC Recent Labs    05/12/23 0523 05/13/23 0420 05/13/23 0812  WBC 4.5 5.0  --   HGB 8.8* 8.5* 11.6*  HCT 31.7* 31.4* 34.0*  MCV 76.0* 77.5*  --   PLT 149* 161  --    Basic Metabolic Panel Recent Labs    28/41/32 0523 05/12/23 1338 05/13/23 0420 05/13/23 0812  NA 134* 131* 132* 137  K 3.1* 3.6 3.9 3.9  CL 87* 88* 90*  --   CO2 38* 34* 35*  --   GLUCOSE 101* 199* 156*  --   BUN 14 13 17   --   CREATININE 0.97 1.06* 1.09*  --   CALCIUM  8.4* 8.4* 8.6*  --   MG 2.1  --  2.2  --   PHOS 2.7  --  4.2  --    Liver Function Tests Recent Labs     05/11/23 1009  AST 29  ALT 20  ALKPHOS 103  BILITOT 1.7*  PROT 7.4  ALBUMIN 2.6*   BNP: BNP (last 3 results) Recent Labs    09/16/22 1145 12/31/22 0904 05/06/23 1603  BNP 1,157.1* 1,296.0* 1,671.0*   Hemoglobin A1C No results for input(s): "HGBA1C" in the last 72 hours.  Fasting Lipid Panel No results for input(s): "CHOL", "HDL", "LDLCALC", "TRIG", "CHOLHDL", "LDLDIRECT" in the last 72 hours.  Imaging   DG CHEST PORT 1 VIEW Result Date: 05/12/2023 CLINICAL DATA:  Central line EXAM: PORTABLE CHEST 1 VIEW COMPARISON:  05/12/2023 FINDINGS: Cardiomegaly with vascular congestion. Right upper extremity central venous catheter tip courses cranially with the tip positioned over the neck IMPRESSION: 1. Right upper extremity central venous catheter tip courses cranially with the tip positioned over the neck. Recommend repositioning. Subsequent chest x-ray has been performed following PICC repositioning 2. Cardiomegaly with vascular congestion. Electronically Signed   By: Esmeralda Hedge M.D.   On: 05/12/2023 20:16   DG CHEST PORT 1 VIEW Result Date: 05/12/2023 CLINICAL DATA:  Central line placement EXAM: PORTABLE CHEST 1 VIEW COMPARISON:  05/12/2023, 05/06/2023 FINDINGS: Cardiomegaly with vascular congestion. No pleural effusion or pneumothorax. Right upper extremity central venous catheter with poorly visible tip, suspect that the tip is at the distal SVC. IMPRESSION: 1. Right upper extremity central venous catheter with poorly visible tip, suspect that the tip is at the distal SVC. 2. Cardiomegaly with vascular congestion. Electronically Signed   By: Esmeralda Hedge M.D.   On: 05/12/2023 20:15   US  EKG SITE RITE Result Date: 05/12/2023 If Site Rite image not attached, placement could not be confirmed due to current cardiac rhythm.  DG CHEST PORT 1 VIEW Result Date: 05/12/2023 CLINICAL DATA:  Cough. EXAM: PORTABLE CHEST 1 VIEW COMPARISON:  Radiographs 05/06/2023 and 07/07/2022.  CT 10/10/2022.  FINDINGS: 1427 hours. New right arm PICC projects cephalad into the neck, tip not visualized. Repositioning recommended. Stable cardiac enlargement and vascular congestion. There is no confluent airspace disease, pneumothorax or significant pleural effusion. The bones appear unchanged. Telemetry leads overlie the chest. IMPRESSION: 1. New right arm PICC projects cephalad into the neck, tip not visualized. Repositioning and follow-up radiographs recommended. 2. Stable cardiac enlargement and vascular congestion. No evidence of pneumonia. 3. These results will be called to the ordering clinician or representative by the Radiologist Assistant, and communication documented in the PACS or [employer]. Electronically Signed   By: Elmon Hagedorn M.D.   On: 05/12/2023 17:35    Medications:    Scheduled Medications:  acetaZOLAMIDE   500 mg Oral BID   atorvastatin   20 mg Oral Daily   benzonatate   200 mg Oral TID   Chlorhexidine  Gluconate Cloth  6 each Topical Daily   digoxin   0.125 mg Oral Daily   insulin  aspart  0-15 Units Subcutaneous Q4H   ipratropium-albuterol   3 mL Nebulization BID   metolazone   5 mg Oral BID   pantoprazole   40 mg Oral Daily   potassium chloride   40 mEq Oral Q4H   senna-docusate  2 tablet Oral QHS   sodium chloride  flush  10-40 mL Intracatheter Q12H   sodium chloride  flush  10-40 mL Intracatheter Q12H   spironolactone   50 mg Oral Daily   Warfarin - Pharmacist Dosing Inpatient   Does not apply q1600    Infusions:  furosemide  (LASIX ) 200 mg in dextrose  5 % 100 mL (2 mg/mL) infusion 10 mg/hr (05/13/23 0600)   milrinone  0.25 mcg/kg/min (05/13/23 1000)    PRN Medications: acetaminophen  **OR** acetaminophen , cyclobenzaprine , guaiFENesin -dextromethorphan , lip balm, ondansetron  **OR** ondansetron  (ZOFRAN ) IV, polyethylene glycol, sodium chloride  flush, sodium chloride  flush, traMADol   Patient Profile   [name] [name] is a 48 y.o. female with hx of saddle PE DVT/PE (2015)  s/p IVC filter, pancytopenia, non-obstructive CAD, morbid obesity, OSA on CPAP, severe mitral regurgitation severe systolic HF due to NICM and medication noncompliance. Admitted to [facility] with A/C HFrEF with BiV dysfunction. Went into cardiogenic shock and was transferred to [facility].   Assessment/Plan   Acute on chronic systolic HF with biventricualr dysfunction   Cardiogenic shock - LHC in 2023 with nonobstructive CAD.  - RHC 9/24 RA 5 PA 53/21 (33) PCWP 21 Fick 4.6/2.0 TD 5.0/2.24 - Echo 4/25 EF 20-25% severe RV dysfunction with septal flattening Mod-severe MR - RHC today on 7.5 DBA: RA mean 17, PA 85/35 (53), PCWP mean 24, Fick CI 2.7, TD CI 2.6.  - Given severely elevated PA pressures, will switch DBA to 0.25 milrinone . - Decreased lasix  gtt to 10/hr d/t severe cramps. Give metolazone  5 BID and diamox  500 BID.  -  Continue digoxin  0.125 mg daily - Continue spiro 50 mg daily - Long-term plan not certain. Previously declined LVAD workup. Once diuresed will need reassessment of hemodynamics and consideration for advanced therapies  2. Severe MR - Current echo mod/sev MR - May need to reassess valve after diuresis, likely overload contributing to severity - No change   3. AKI:  - due to ATN shock - continue hemodynamic support - Scr back to baseline - resolved  4. Hx of PE/DVT w/ IVC filter - Submassive saddle PE extending from the distal main pulmonary artery and to the left and right pulmonary arteries with extension into the left upper lobe, left lower lobe and lingula branches and right upper lobe, right middle lobe and right lower lobes on 04/27/2013  - Evaluated at [facility] where thrombophilia work-up was negative (Lupus inhibitor, anticardiolipin antibodies, and anti beta-2  glycoprotein antibodies were negative; protein C activity 85%, protein S activity 76%, factor V Leiden, and prothrombin gene mutation ) - IVC filter in place; could not be removed due to significant thrombus burden on  filter at [facility].  - Previously advised against DOACs due to severity of thrombus burden and weight >250lbs.  - INR 3.2. Back on Warfarin as shock has resolved and no further procedures planned at this time.    5. Morbid obesity  - Body mass index is 40.17 kg/m. - consider GLP1RA   6. CAD - LHC in 5/23 w/ nonobstructive CAD - No s/s angina   7. Microcytic anemia - tSat 4, ferritin 16 - Will give IV Fe prior to dc  8. Hypokalemia - Supp aggressively with diuresis - Continue spiro 50 mg daily  CRITICAL CARE Performed by: Gerilyn Kobus N   Total critical care time: 17 minutes  Critical care time was exclusive of separately billable procedures and treating other patients.  Critical care was necessary to treat or prevent imminent or life-threatening deterioration.  Critical care was time spent personally by me on the following activities: development of treatment plan with patient and/or surrogate as well as nursing, discussions with consultants, evaluation of patient's response to treatment, examination of patient, obtaining history from patient or surrogate, ordering and performing treatments and interventions, ordering and review of laboratory studies, ordering and review of radiographic studies, pulse oximetry and re-evaluation of patient's condition.   Length of Stay: 7  Ngozi Alvidrez N, PA-C  05/13/2023, 11:19 AM  Advanced Heart Failure Team Pager [phone] (M-F; 7a - 5p)  Please contact [facility] for night-coverage after hours (5p -7a ) and weekends on [email or web address]  11:19 AM

## 2023-05-13 NOTE — Progress Notes (Signed)
 Radiologist stated PICC tip was most likely in the distal SVC. IV team's recommendation is that PICC is safe to use. Dr. Dozier Genre stated PICC is safe to use.

## 2023-05-14 ENCOUNTER — Inpatient Hospital Stay (HOSPITAL_COMMUNITY)

## 2023-05-14 ENCOUNTER — Inpatient Hospital Stay

## 2023-05-14 DIAGNOSIS — I5043 Acute on chronic combined systolic (congestive) and diastolic (congestive) heart failure: Secondary | ICD-10-CM | POA: Diagnosis not present

## 2023-05-14 LAB — BASIC METABOLIC PANEL WITH GFR
Anion gap: 9 (ref 5–15)
Anion gap: 8 (ref 5–15)
BUN: 14 mg/dL (ref 6–20)
BUN: 19 mg/dL (ref 6–20)
CO2: 38 mmol/L — ABNORMAL HIGH (ref 22–32)
CO2: 25 mmol/L (ref 22–32)
Calcium: 9.1 mg/dL (ref 8.9–10.3)
Calcium: 8.8 mg/dL — ABNORMAL LOW (ref 8.9–10.3)
Chloride: 85 mmol/L — ABNORMAL LOW (ref 98–111)
Chloride: 105 mmol/L (ref 98–111)
Creatinine, Ser: 1 mg/dL (ref 0.44–1.00)
Creatinine, Ser: 1.1 mg/dL — ABNORMAL HIGH (ref 0.44–1.00)
GFR, Estimated: >60 mL/min (ref >60)
GFR, Estimated: >60 mL/min (ref >60)
Glucose, Bld: 142 mg/dL — ABNORMAL HIGH (ref 70–99)
Glucose, Bld: 193 mg/dL — ABNORMAL HIGH (ref 70–99)
Potassium: 3.3 mmol/L — ABNORMAL LOW (ref 3.5–5.1)
Potassium: 4.3 mmol/L (ref 3.5–5.1)
Sodium: 132 mmol/L — ABNORMAL LOW (ref 135–145)
Sodium: 138 mmol/L (ref 135–145)

## 2023-05-14 LAB — POCT I-STAT 7, (LYTES, BLD GAS, ICA,H+H)
Acid-Base Excess: 14 mmol/L — ABNORMAL HIGH (ref 0.0–2.0)
Bicarbonate: 42.2 mmol/L — ABNORMAL HIGH (ref 20.0–28.0)
Calcium, Ion: 1.25 mmol/L (ref 1.15–1.40)
HCT: 37 % (ref 36.0–46.0)
Hemoglobin: 12.6 g/dL (ref 12.0–15.0)
O2 Saturation: 97 %
Patient temperature: 97.9
Potassium: 3.3 mmol/L — ABNORMAL LOW (ref 3.5–5.1)
Sodium: 134 mmol/L — ABNORMAL LOW (ref 135–145)
TCO2: 44 mmol/L — ABNORMAL HIGH (ref 22–32)
pCO2 arterial: 70 mmHg (ref 32–48)
pH, Arterial: 7.386 (ref 7.35–7.45)
pO2, Arterial: 93 mmHg (ref 83–108)

## 2023-05-14 LAB — POCT I-STAT EG7
Acid-Base Excess: 9 mmol/L — ABNORMAL HIGH (ref 0.0–2.0)
Bicarbonate: 38.7 mmol/L — ABNORMAL HIGH (ref 20.0–28.0)
Calcium, Ion: 1.21 mmol/L (ref 1.15–1.40)
HCT: 34 % — ABNORMAL LOW (ref 36.0–46.0)
Hemoglobin: 11.6 g/dL — ABNORMAL LOW (ref 12.0–15.0)
O2 Saturation: 52 %
Potassium: 3.9 mmol/L (ref 3.5–5.1)
Sodium: 136 mmol/L (ref 135–145)
TCO2: 41 mmol/L — ABNORMAL HIGH (ref 22–32)
pCO2, Ven: 84.9 mmHg (ref 44–60)
pH, Ven: 7.267 (ref 7.25–7.43)
pO2, Ven: 33 mmHg (ref 32–45)

## 2023-05-14 LAB — COOXEMETRY PANEL
Carboxyhemoglobin: 3.2 % — ABNORMAL HIGH (ref 0.5–1.5)
Methemoglobin: <0.7 % (ref 0.0–1.5)
O2 Saturation: 72.1 %
Total hemoglobin: 9.7 g/dL — ABNORMAL LOW (ref 12.0–16.0)

## 2023-05-14 LAB — GLUCOSE, CAPILLARY
Glucose-Capillary: 119 mg/dL — ABNORMAL HIGH (ref 70–99)
Glucose-Capillary: 82 mg/dL (ref 70–99)
Glucose-Capillary: 89 mg/dL (ref 70–99)
Glucose-Capillary: 97 mg/dL (ref 70–99)
Glucose-Capillary: 138 mg/dL — ABNORMAL HIGH (ref 70–99)
Glucose-Capillary: 125 mg/dL — ABNORMAL HIGH (ref 70–99)
Glucose-Capillary: 136 mg/dL — ABNORMAL HIGH (ref 70–99)

## 2023-05-14 LAB — CBC
HCT: 35.1 % — ABNORMAL LOW (ref 36.0–46.0)
Hemoglobin: 9.6 g/dL — ABNORMAL LOW (ref 12.0–15.0)
MCH: 20.9 pg — ABNORMAL LOW (ref 26.0–34.0)
MCHC: 27.4 g/dL — ABNORMAL LOW (ref 30.0–36.0)
MCV: 76.5 fL — ABNORMAL LOW (ref 80.0–100.0)
Platelets: 173 10*3/uL (ref 150–400)
RBC: 4.59 MIL/uL (ref 3.87–5.11)
RDW: 22.5 % — ABNORMAL HIGH (ref 11.5–15.5)
WBC: 5.7 10*3/uL (ref 4.0–10.5)
nRBC: 0 % (ref 0.0–0.2)

## 2023-05-14 LAB — PROTIME-INR
INR: 3 — ABNORMAL HIGH (ref 0.8–1.2)
Prothrombin Time: 31.5 s — ABNORMAL HIGH (ref 11.4–15.2)

## 2023-05-14 LAB — MAGNESIUM: Magnesium: 2.3 mg/dL (ref 1.7–2.4)

## 2023-05-14 MED ORDER — MEXILETINE HCL 150 MG PO CAPS: 150.0000 mg | ORAL_CAPSULE | Freq: Two times a day (BID) | ORAL | Status: DC

## 2023-05-14 MED ORDER — POTASSIUM CHLORIDE CRYS ER 20 MEQ PO TBCR: 40.0000 meq | EXTENDED_RELEASE_TABLET | ORAL | Status: DC

## 2023-05-14 MED ORDER — POTASSIUM CHLORIDE 10 MEQ/50ML IV SOLN
10.0000 meq | Freq: Once | INTRAVENOUS | Status: AC
  Filled 2023-05-14: qty 100

## 2023-05-14 MED ORDER — POTASSIUM CHLORIDE CRYS ER 20 MEQ PO TBCR
40.0000 meq | EXTENDED_RELEASE_TABLET | ORAL | Status: AC
Start: 2023-05-14 — End: 2023-05-14
  Administered 2023-05-14: 40 meq via ORAL
  Filled 2023-05-14: qty 2

## 2023-05-14 MED ORDER — POTASSIUM CHLORIDE 10 MEQ/50ML IV SOLN
INTRAVENOUS | Status: AC
  Administered 2023-05-14: 10 meq
  Filled 2023-05-14: qty 50

## 2023-05-14 MED ORDER — ACETAZOLAMIDE 250 MG PO TABS
500.0000 mg | ORAL_TABLET | Freq: Two times a day (BID) | ORAL | Status: AC
  Administered 2023-05-14 (×2): 500 mg via ORAL
  Filled 2023-05-14 (×2): qty 2

## 2023-05-14 MED ORDER — WARFARIN SODIUM 5 MG PO TABS
5.0000 mg | ORAL_TABLET | Freq: Once | ORAL | Status: AC
  Administered 2023-05-14: 5 mg via ORAL
  Filled 2023-05-14: qty 1

## 2023-05-14 MED ORDER — POTASSIUM CHLORIDE CRYS ER 20 MEQ PO TBCR
40.0000 meq | EXTENDED_RELEASE_TABLET | Freq: Once | ORAL | Status: AC
  Administered 2023-05-14: 40 meq via ORAL
  Filled 2023-05-14: qty 2

## 2023-05-14 NOTE — Progress Notes (Signed)
 At beginning of shift, Swan-Ganz catheter positioned at 53 cm. Dr. Alease Amend advanced catheter during rounds, now reading at 55 cm. At 1120, Swan-Ganz catheter became malpositioned, reading at 40 cm and waveform dampened. Dr. Alease Amend and heart failure team made aware, will leave in for now per team and reposition it later this afternoon.

## 2023-05-14 NOTE — Consult Note (Signed)
 PHARMACY - ANTICOAGULATION CONSULT NOTE  Pharmacy Consult for Warfarin Indication: history of pulmonary embolus and DVT  Allergies  Allergen Reactions   Sulfa  Antibiotics Itching, Swelling and Rash    Lip swelling    Patient Measurements: Height: 5\' 10"  (177.8 cm) Weight: 115.2 kg (253 lb 15.5 oz) IBW/kg (Calculated) : 68.5 HEPARIN  DW (KG): 101.8  Vital Signs: Temp: 98.6 F (37 C) (04/23 1000) Temp Source: Core (04/23 0800) BP: 118/71 (04/23 0800) Pulse Rate: 87 (04/23 0900)  Labs: Recent Labs    05/12/23 0523 05/12/23 1338 05/13/23 0420 05/13/23 0812 05/13/23 2212 05/14/23 0408 05/14/23 0409  HGB 8.8*  --  8.5* 11.6*  --  12.6 9.6*  HCT 31.7*  --  31.4* 34.0*  --  37.0 35.1*  PLT 149*  --  161  --   --   --  173  LABPROT 29.1*  --  32.7*  --   --   --  31.5*  INR 2.7*  --  3.2*  --   --   --  3.0*  CREATININE 0.97   < > 1.09*  --  0.95  --  1.00   < > = values in this interval not displayed.    Estimated Creatinine Clearance: 96.8 mL/min (by C-G formula based on SCr of 1 mg/dL).   Medical History: Past Medical History:  Diagnosis Date   Anemia    Blood transfusion without reported diagnosis    CAD (coronary artery disease)    Nonobstructive   Closed left ankle fracture    August 30 2012   Clotting disorder (HCC)    DVT (deep venous thrombosis) (HCC)    L leg   Fibroid tumor    HFrEF (heart failure with reduced ejection fraction) (HCC)    Hidradenitis    Hypertension    Mitral regurgitation    Myocardial infarction (HCC)    NICM (nonischemic cardiomyopathy) (HCC)    NSVT (nonsustained ventricular tachycardia) (HCC)    Obesity    OSA on CPAP    Pulmonary embolism (HCC) 04/2013   Sleep apnea     Medications: see MAR  Assessment: 48 y.o. female with medical history significant for systolic CHF, hypertension, DVT and PE on chronic anticoagulation with warfarin, IVC status, OSA on CPAP presented to the ED with complaints of increasing difficulty  breathing, and swelling to her bilateral lower extremities up to her thighs that started 2 days ago. She reports her baseline weight is about 274 pounds, she is weighing 299 here in the ED. She has had to sleep sitting up. Pharmacy has been consulted to manage warfarin while admitted.   PTA warfarin regimen = 15 mg Wed/Sat, 10 mg AOD (recently increased on 4/9 w/ INR 1.7 on 10 mg daily) -INR 5.3 on admit (not reversed), last warfarin dose 15 mg on 4/16 PM (after boosted dose of 15 mg on 4/15 from PTA regimen)  -Warfarin restarted 4/19 -INR supratherapeutic at 3.0 (-0.2) today after warfarin 5 mg x1 yesterday, CBC stable.  No s/sx bleeding.  Good appetite, eating all meals.  Goal of Therapy:  INR 2-3 Monitor platelets by anticoagulation protocol: Yes  Plan:  Give warfarin 5 mg x 1 INR/CBC daily Monitor for S/S of bleeding  Albino Alu, PharmD PGY2 Cardiology Pharmacy Resident 05/14/2023  10:18 AM

## 2023-05-14 NOTE — Progress Notes (Addendum)
 Advanced Heart Failure Rounding Note  Cardiologist: Armida Lander, MD   Chief Complaint: Cardiogenic shock  Subjective:    CO-OX 72% on 0.25 milrinone .  12.4L UOP last 24 hrs with lasix  gtt at 10/hr + 5 mg metolazone  BID + diamox  500 BID. Mix of bed and standing weights charted.  Swan #s CO 7.7 CI 3.2 PA 70/23 PCWP 14 CVP 7  Much more alert today after using CPAP overnight.  Legs starting to cramp again.   Objective:    Weight Range: 115.2 kg Body mass index is 36.44 kg/m.   Vital Signs:   Temp:  [97.5 F (36.4 C)-98.1 F (36.7 C)] 98.1 F (36.7 C) (04/23 0600) Pulse Rate:  [72-92] 91 (04/23 0600) Resp:  [17-34] 19 (04/23 0600) BP: (102-128)/(64-96) 109/81 (04/23 0600) SpO2:  [89 %-100 %] 100 % (04/23 0600) Weight:  [115.2 kg] 115.2 kg (04/23 0500) Last BM Date : 05/11/23  Weight change: Filed Weights   05/12/23 0814 05/13/23 0434 05/14/23 0500  Weight: 125.7 kg 127 kg 115.2 kg   Intake/Output:  Intake/Output Summary (Last 24 hours) at 05/14/2023 0707 Last data filed at 05/14/2023 0600 Gross per 24 hour  Intake 649.05 ml  Output 16109 ml  Net -11785.95 ml    Physical Exam  General:  Chronically ill appearing Neck: R internal jugular PA catheter Cor: Regular rate & rhythm. No rubs, gallops or murmurs. Lungs: clear Abdomen: obese, soft, nontender, nondistended.  Extremities: 1-2+ edema Neuro: alert & orientedx3. Affect pleasant   Telemetry   SR with runs of NSVT overnight (longest 19 beats)  Labs    CBC Recent Labs    05/13/23 0420 05/13/23 0812 05/14/23 0408 05/14/23 0409  WBC 5.0  --   --  5.7  HGB 8.5*   < > 12.6 9.6*  HCT 31.4*   < > 37.0 35.1*  MCV 77.5*  --   --  76.5*  PLT 161  --   --  173   < > = values in this interval not displayed.   Basic Metabolic Panel Recent Labs    60/45/40 0523 05/12/23 1338 05/13/23 0420 05/13/23 0812 05/13/23 2212 05/14/23 0408 05/14/23 0409  NA 134*   < > 132*   < > 134* 134*  132*  K 3.1*   < > 3.9   < > 3.5 3.3* 3.3*  CL 87*   < > 90*  --  90*  --  85*  CO2 38*   < > 35*  --  34*  --  38*  GLUCOSE 101*   < > 156*  --  132*  --  142*  BUN 14   < > 17  --  14  --  14  CREATININE 0.97   < > 1.09*  --  0.95  --  1.00  CALCIUM  8.4*   < > 8.6*  --  8.9  --  9.1  MG 2.1  --  2.2  --  2.2  --  2.3  PHOS 2.7  --  4.2  --   --   --   --    < > = values in this interval not displayed.   Liver Function Tests Recent Labs    05/11/23 1009  AST 29  ALT 20  ALKPHOS 103  BILITOT 1.7*  PROT 7.4  ALBUMIN 2.6*   BNP: BNP (last 3 results) Recent Labs    09/16/22 1145 12/31/22 0904 05/06/23 1603  BNP 1,157.1* 1,296.0* 1,671.0*  Hemoglobin A1C No results for input(s): "HGBA1C" in the last 72 hours.  Fasting Lipid Panel No results for input(s): "CHOL", "HDL", "LDLCALC", "TRIG", "CHOLHDL", "LDLDIRECT" in the last 72 hours.  Imaging   CARDIAC CATHETERIZATION Result Date: 05/13/2023 HEMODYNAMICS: RA:       19 mmHg (mean) RV:       90/9, 19 mmHg PA:       90/38 mmHg (55 mean) PCWP: 27 mmHg (mean) with v waves to 45    Estimated Fick CO/CI   6.6L/min, 2.74L/min/m2 Thermodilution CO/CI   6.4L/min, 2.66L/min/m2    TPG  28  mmHg     PVR  4.375 Wood Units PAPi  2.73  IMPRESSION: Right heart catheterization for end stage cardiomyopathy on dobutamine  7.5. Severely elevated biventricular filling pressures. Normal cardiac output by thermodilution on inotropic support. 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 RECOMMENDATIONS: Increase IV diuresis Transition to milrinone  given severely elevated PA pressures Will need aggressive diuresis and consideration of home inotropes   DG CHEST PORT 1 VIEW Result Date: 05/13/2023 CLINICAL DATA:  Status post Swan-Ganz catheter placement EXAM: PORTABLE CHEST 1 VIEW COMPARISON:  05/12/2023 FINDINGS: New right jugular Swan-Ganz catheter is noted in the right pulmonary artery. Cardiac  shadow remains enlarged. Right PICC is seen in satisfactory position. Central vascular congestion is noted increased when compared with the prior exam. IMPRESSION: Worsening central vascular congestion. New Swan-Ganz catheter as described.  No pneumothorax is noted. Electronically Signed   By: Violeta Grey M.D.   On: 05/13/2023 11:25    Medications:    Scheduled Medications:  atorvastatin   20 mg Oral Daily   benzonatate   200 mg Oral TID   Chlorhexidine  Gluconate Cloth  6 each Topical Daily   digoxin   0.125 mg Oral Daily   insulin  aspart  0-15 Units Subcutaneous Q4H   ipratropium-albuterol   3 mL Nebulization BID   pantoprazole   40 mg Oral Daily   potassium chloride   40 mEq Oral Q4H   senna-docusate  2 tablet Oral QHS   sodium chloride  flush  10-40 mL Intracatheter Q12H   sodium chloride  flush  10-40 mL Intracatheter Q12H   spironolactone   50 mg Oral Daily   warfarin  5 mg Oral ONCE-1600   Warfarin - Pharmacist Dosing Inpatient   Does not apply q1600    Infusions:  furosemide  (LASIX ) 200 mg in dextrose  5 % 100 mL (2 mg/mL) infusion 10 mg/hr (05/14/23 0600)   milrinone  0.25 mcg/kg/min (05/14/23 0600)   potassium chloride       PRN Medications: acetaminophen  **OR** acetaminophen , cyclobenzaprine , guaiFENesin -dextromethorphan , lip balm, ondansetron  **OR** ondansetron  (ZOFRAN ) IV, polyethylene glycol, sodium chloride  flush, sodium chloride  flush, traMADol   Patient Profile   [name] [name] is a 48 y.o. 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 Admitted to [facility] with A/C HFrEF with BiV dysfunction. Went into cardiogenic shock and was transferred to [facility].   Assessment/Plan   Acute on chronic systolic HF with biventricualr dysfunction   Cardiogenic shock - LHC in 2023 with nonobstructive CAD.  - RHC 9/24 RA 5 PA 53/21  (33) PCWP 21 Fick 4.6/2.0 TD 5.0/2.24 - Echo 4/25 EF 20-25% severe RV dysfunction with septal flattening Mod-severe MR - RHC 05/13/23 on 7.5 DBA: Severely elevated biventricular filling pressures, severe post capillary PH, normal CO by TD - DBA switched to milrinone   04/22. CO-OX 72% on 0.25 milrinone  - Excellent diuresis last 24 hrs on lasix  gtt + metolazone  + diamox  - TD CI 3.2 on 0.25 milrinone . CVP down to 7 and PCWP 14. Stop lasix  gtt and metolazone . Give 500 mg diamox  BID again today. - Continue digoxin  0.125 mg daily, dig level on 04/25 - Continue spiro 50 mg daily - Plan for possible home milrinone . [facility] home infusion rep contacted and TOC consulted. If able to present for outpatient follow-ups will consider LVAD in the future.  2. Severe MR - Current echo mod/sev MR - Reassess after diuresis   3. AKI:  - due to ATN shock - continue hemodynamic support - Resolved  4. Hx of PE/DVT w/ IVC filter - Submassive saddle PE extending from the distal main pulmonary artery and to the left and right pulmonary arteries with extension into the left upper lobe, left lower lobe and lingula branches and right upper lobe, right middle lobe and right lower lobes on 04/27/2013  - Evaluated at [facility] where thrombophilia work-up was negative (Lupus inhibitor, anticardiolipin antibodies, and anti beta-2  glycoprotein antibodies were negative; protein C activity 85%, protein S activity 76%, factor V Leiden, and prothrombin gene mutation ) - IVC filter in place; could not be removed due to significant thrombus burden on filter at [facility].  - Previously advised against DOACs due to severity of thrombus burden and weight >250lbs.  - INR 3.0. Back on Warfarin as shock has resolved and no further procedures planned at this time.    5. Obesity  - Body mass index is 36.44 kg/m. - BMI trending down significantly with diuresis - consider GLP1RA   6. CAD - LHC in 5/23 w/ nonobstructive CAD - No s/s angina   7.  Microcytic anemia - tSat 4, ferritin 16 - Will give IV Fe prior to dc  8. Hypokalemia - K 3.3 Supp today - Continue spiro 50 mg daily  CRITICAL CARE Performed by: Gerilyn Kobus N   Total critical care time: 17 minutes  Critical care time was exclusive of separately billable procedures and treating other patients.  Critical care was necessary to treat or prevent imminent or life-threatening deterioration.  Critical care was time spent personally by me on the following activities: development of treatment plan with patient and/or surrogate as well as nursing, discussions with consultants, evaluation of patient's response to treatment, examination of patient, obtaining history from patient or surrogate, ordering and performing treatments and interventions, ordering and review of laboratory studies, ordering and review of radiographic studies, pulse oximetry and re-evaluation of patient's condition.   Length of Stay: 8  [name] [name] N, PA-C  05/14/2023, 7:07 AM  Advanced Heart Failure Team Pager [phone] (M-F; 7a - 5p)  Please contact [facility] for night-coverage after hours (5p -7a ) and weekends on [email or web address]

## 2023-05-14 NOTE — TOC Progression Note (Signed)
 Transition of Care [facility]) - Progression Note    Patient Details  Name: [name] [name] MRN: [id number] Date of Birth: March 01, 1975  [facility] CM/SW Contact  Graves-Bigelow, Jari Merles, RN Phone Number: 05/14/2023, 1:45 PM  Clinical Narrative: Case Manager received a consult for home Milrinone . [facility] will follow for IV Milrinone  and [facility] is willing to follow for RN Services.  Start of care to begin within 24-48 hours post transition home. Case Manager will continue to follow for additional transition of care needs as the patient progresses.    Expected Discharge Plan: Home w Home Health Services Barriers to Discharge: No Barriers Identified  Expected Discharge Plan and Services   Discharge Planning Services: CM Consult Post Acute Care Choice: Home Health Living arrangements for the past 2 months: Single Family Home    HH Arranged: RN, Disease Management HH Agency: [facility] Date HH Agency Contacted: 05/14/23 Time HH Agency Contacted: [id number] Representative spoke with at [facility] Agency: Imelda Man   Social Determinants of Health (SDOH) Interventions SDOH Screenings   Food Insecurity: No Food Insecurity (05/06/2023)  Housing: Low Risk  (05/06/2023)  Transportation Needs: No Transportation Needs (05/06/2023)  Utilities: Not At Risk (05/06/2023)  Alcohol  Screen: Low Risk  (04/03/2023)  Depression (PHQ2-9): Low Risk  (09/13/2022)  Financial Resource Strain: Low Risk  (04/03/2023)  Physical Activity: Insufficiently Active (04/03/2023)  Social Connections: Socially Isolated (04/03/2023)  Stress: No Stress Concern Present (04/03/2023)  Tobacco Use: High Risk (05/06/2023)    Readmission Risk Interventions    07/03/2022   12:12 PM 02/13/2022   12:41 PM 12/31/2021    1:52 PM  Readmission Risk Prevention Plan  Transportation Screening Complete Complete   HRI or Home Care Consult Complete Complete Complete  Social Work Consult for Recovery Care  Planning/Counseling Complete Complete Complete  Palliative Care Screening Not Applicable Not Applicable Not Applicable  Medication Review Ocean[employer]) Complete Complete Complete

## 2023-05-14 NOTE — Plan of Care (Signed)

## 2023-05-15 ENCOUNTER — Inpatient Hospital Stay (HOSPITAL_COMMUNITY)

## 2023-05-15 DIAGNOSIS — I5043 Acute on chronic combined systolic (congestive) and diastolic (congestive) heart failure: Secondary | ICD-10-CM | POA: Diagnosis not present

## 2023-05-15 LAB — BASIC METABOLIC PANEL WITH GFR
Anion gap: 10 (ref 5–15)
Anion gap: 9 (ref 5–15)
BUN: 19 mg/dL (ref 6–20)
BUN: 16 mg/dL (ref 6–20)
CO2: 33 mmol/L — ABNORMAL HIGH (ref 22–32)
CO2: 33 mmol/L — ABNORMAL HIGH (ref 22–32)
Calcium: 9.2 mg/dL (ref 8.9–10.3)
Calcium: 8.7 mg/dL — ABNORMAL LOW (ref 8.9–10.3)
Chloride: 87 mmol/L — ABNORMAL LOW (ref 98–111)
Chloride: 91 mmol/L — ABNORMAL LOW (ref 98–111)
Creatinine, Ser: 1.16 mg/dL — ABNORMAL HIGH (ref 0.44–1.00)
Creatinine, Ser: 1.06 mg/dL — ABNORMAL HIGH (ref 0.44–1.00)
GFR, Estimated: 59 mL/min — ABNORMAL LOW (ref >60)
GFR, Estimated: >60 mL/min (ref >60)
Glucose, Bld: 130 mg/dL — ABNORMAL HIGH (ref 70–99)
Glucose, Bld: 122 mg/dL — ABNORMAL HIGH (ref 70–99)
Potassium: 3.3 mmol/L — ABNORMAL LOW (ref 3.5–5.1)
Potassium: 3.5 mmol/L (ref 3.5–5.1)
Sodium: 130 mmol/L — ABNORMAL LOW (ref 135–145)
Sodium: 133 mmol/L — ABNORMAL LOW (ref 135–145)

## 2023-05-15 LAB — GLUCOSE, CAPILLARY
Glucose-Capillary: 106 mg/dL — ABNORMAL HIGH (ref 70–99)
Glucose-Capillary: 117 mg/dL — ABNORMAL HIGH (ref 70–99)
Glucose-Capillary: 88 mg/dL (ref 70–99)
Glucose-Capillary: 95 mg/dL (ref 70–99)
Glucose-Capillary: 98 mg/dL (ref 70–99)
Glucose-Capillary: 99 mg/dL (ref 70–99)

## 2023-05-15 LAB — PROTIME-INR
INR: 3 — ABNORMAL HIGH (ref 0.8–1.2)
Prothrombin Time: 31.6 s — ABNORMAL HIGH (ref 11.4–15.2)

## 2023-05-15 LAB — COOXEMETRY PANEL
Carboxyhemoglobin: 2.5 % — ABNORMAL HIGH (ref 0.5–1.5)
Methemoglobin: <0.7 % (ref 0.0–1.5)
O2 Saturation: 75.2 %
Total hemoglobin: 9.1 g/dL — ABNORMAL LOW (ref 12.0–16.0)

## 2023-05-15 LAB — MAGNESIUM: Magnesium: 2.2 mg/dL (ref 1.7–2.4)

## 2023-05-15 MED ORDER — SODIUM CHLORIDE 0.9 % IV SOLN
500.0000 mg | INTRAVENOUS | Status: DC
  Administered 2023-05-15: 500 mg via INTRAVENOUS
  Filled 2023-05-15 (×2): qty 25

## 2023-05-15 MED ORDER — LIDOCAINE HCL 1 % IJ SOLN
10.0000 mL | Freq: Once | INTRAMUSCULAR | Status: DC
  Filled 2023-05-15: qty 10

## 2023-05-15 MED ORDER — ACETAZOLAMIDE 250 MG PO TABS
500.0000 mg | ORAL_TABLET | Freq: Two times a day (BID) | ORAL | Status: DC
  Administered 2023-05-15 (×2): 500 mg via ORAL
  Filled 2023-05-15 (×2): qty 2

## 2023-05-15 MED ORDER — BUMETANIDE 2 MG PO TABS
2.0000 mg | ORAL_TABLET | Freq: Two times a day (BID) | ORAL | Status: AC
  Administered 2023-05-15 (×2): 2 mg via ORAL
  Filled 2023-05-15 (×2): qty 1

## 2023-05-15 MED ORDER — POLYETHYLENE GLYCOL 3350 17 G PO PACK
17.0000 g | PACK | Freq: Every day | ORAL | Status: DC
  Administered 2023-05-15 – 2023-05-16 (×2): 17 g via ORAL
  Filled 2023-05-15 (×2): qty 1

## 2023-05-15 MED ORDER — SENNOSIDES-DOCUSATE SODIUM 8.6-50 MG PO TABS
1.0000 | ORAL_TABLET | Freq: Two times a day (BID) | ORAL | Status: DC
  Administered 2023-05-15 – 2023-05-16 (×3): 1 via ORAL
  Filled 2023-05-15 (×3): qty 1

## 2023-05-15 MED ORDER — WARFARIN SODIUM 5 MG PO TABS
5.0000 mg | ORAL_TABLET | Freq: Once | ORAL | Status: AC
  Administered 2023-05-15: 5 mg via ORAL
  Filled 2023-05-15: qty 1

## 2023-05-15 MED ORDER — IRON SUCROSE 500 MG IVPB - SIMPLE MED
500.0000 mg | INTRAVENOUS | Status: DC
  Filled 2023-05-15: qty 275

## 2023-05-15 MED ORDER — POTASSIUM CHLORIDE CRYS ER 20 MEQ PO TBCR
40.0000 meq | EXTENDED_RELEASE_TABLET | ORAL | Status: AC
Start: 2023-05-15 — End: 2023-05-15
  Administered 2023-05-15 (×3): 40 meq via ORAL
  Filled 2023-05-15 (×3): qty 2

## 2023-05-15 MED ORDER — POTASSIUM CHLORIDE CRYS ER 20 MEQ PO TBCR
40.0000 meq | EXTENDED_RELEASE_TABLET | ORAL | Status: DC
Start: 2023-05-15 — End: 2023-05-15
  Administered 2023-05-15: 40 meq via ORAL
  Filled 2023-05-15: qty 2

## 2023-05-15 MED ORDER — INSULIN ASPART 100 UNIT/ML IJ SOLN: 0.0000 [IU] | Freq: Three times a day (TID) | INTRAMUSCULAR | Status: DC

## 2023-05-15 NOTE — Plan of Care (Signed)
   Problem: Education: Goal: Knowledge of General Education information will improve Description: Including pain rating scale, medication(s)/side effects and non-pharmacologic comfort measures Outcome: Progressing   Problem: Clinical Measurements: Goal: Will remain free from infection Outcome: Progressing Goal: Diagnostic test results will improve Outcome: Progressing   Problem: Activity: Goal: Risk for activity intolerance will decrease Outcome: Progressing

## 2023-05-15 NOTE — Discharge Summary (Signed)
 Advanced Heart Failure Team  Discharge Summary   Patient ID: [name] [name] MRN: [id number], DOB/AGE: [birth year] 48 y.o. Admit date: 05/06/2023 D/C date:     05/16/2023   Primary Discharge Diagnoses:  Acute on chronic systolic heart failure with biventricular dysfunction Cardiogenic shock Severe MR AKI Hx of PE/DVT w/ IVC filter Obesity CAD Microcytic anemia Hypokalemia  Hospital Course:   [name] [name] is a 48 y.o. 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   [facility] with worsening AHF symptoms. She was found to be in cardiogenic shock with elevated lactic acid. Due to hypotension and increased pressor requirements she was transferred to [facility]. Transferred to CVICU on milrinone  0.375 and NE. Echo 4/25 EF 20-25% severe RV dysfunction with septal flattening mod-severe MR. On arrival she was started switched from milrinone  to DBA and started on lasix  gtt. She was able to wean off NE. Co-ox persistently low, so DBA rate had to be increased and opted to go for RHC for further optimization. RHC showed elevated pressures bilaterally with preserved CO/CI on DBA 7.5. She required lasix , metolazone  and diamox  to fully diurese. Given history of noncompliance the team opted for home inotrope. Hoping that she stays compliant with medications and follow up visits with plans to workup for LVAD if these improves. Transitioned to milrinone . Will go home with IV Milrinone .   Double lumen PICC placed. D/c milrinone  dose: 0.25 mcg/kg/min. Has been set up with [facility] RN and Kay Parson with [employer] hooked her up to her home pump at discharge.   Pt will continue to be followed closely in the HF clinic. Dr Alease Amend evaluated and deemed appropriate for discharge.   See below for detailed problem list:  Acute on chronic systolic HF  with biventricualr dysfunction   Cardiogenic shock, resolved - LHC in 2023 with nonobstructive CAD.  - Echo 4/25 EF 20-25% severe RV dysfunction with mod/sev MR - RHC 4/225 (on 7.5 DBA): Severely elevated biventricular filling pressures, severe post capillary PH, normal CO by TD. - Repeat echo EF 25-30%, RV moderately reduced, IVC remains dilated. - Continue home Milrinone  0.25 mcg/kg/min thru tunneled DL PICC - Continue Bumex  2 mg daily.  - Continue digoxin  0.125 mg daily - Continue spiro 50 mg daily - If able to demonstrate better compliance with follow-ups and maintain her line, will consider for LVAD.   2. Severe MR - Last echo mod/sev MR - 3/6 systolic apex murmur on exam - Echo today with severe MR   3. AKI:  - due to ATN shock - Resolved   4. Hx of PE/DVT w/ IVC filter - Submassive saddle PE extending from the distal main pulmonary artery and to the left and right pulmonary arteries with extension into the left upper lobe, left lower lobe and lingula branches and right upper lobe, right middle lobe and right lower lobes on 04/27/2013  - Evaluated at [facility] where thrombophilia work-up was negative (Lupus inhibitor, anticardiolipin antibodies, and anti beta-2  glycoprotein antibodies were negative; protein C activity 85%, protein S activity 76%, factor V Leiden, and prothrombin gene mutation ) - IVC filter in place; could not be removed due to significant thrombus burden on filter at [facility].  - Previously advised against DOACs due to severity of thrombus burden and weight >250lbs.  - Continue Warfarin   5. Obesity  - Body  mass index is 35.08 kg/m. - BMI significantly down with diuresis - consider GLP1-RA as outpatient   6. CAD - LHC in 5/23 w/ nonobstructive CAD - No s/s angina   7. Microcytic anemia - tSat 4, ferritin 16 - Recieved IV iron    8. Hypokalemia - KCL 40 mEq bid   Discharge Weight Range: 244.5 lbs Discharge Vitals: Blood pressure 118/80, pulse 90, temperature  98.5 F (36.9 C), temperature source Oral, resp. rate 14, height 5\' 10"  (1.778 m), weight 110.9 kg, last menstrual period 03/25/2023, SpO2 97%.  Labs: Lab Results  Component Value Date   WBC 4.9 05/16/2023   HGB 9.0 (L) 05/16/2023   HCT 32.5 (L) 05/16/2023   MCV 76.3 (L) 05/16/2023   PLT 193 05/16/2023    Recent Labs  Lab 05/11/23 1009 05/12/23 0523 05/16/23 0408  NA 135   < > 131*  K 3.5   < > 3.5  CL 87*   < > 90*  CO2 37*   < > 31  BUN 14   < > 21*  CREATININE 0.93   < > 1.08*  CALCIUM  8.3*   < > 8.8*  PROT 7.4  --   --   BILITOT 1.7*  --   --   ALKPHOS 103  --   --   ALT 20  --   --   AST 29  --   --   GLUCOSE 114*   < > 158*   < > = values in this interval not displayed.   Lab Results  Component Value Date   CHOL 75 05/08/2023   HDL 22 (L) 05/08/2023   LDLCALC 46 05/08/2023   TRIG 35 05/08/2023   BNP (last 3 results) Recent Labs    09/16/22 1145 12/31/22 0904 05/06/23 1603  BNP 1,157.1* 1,296.0* 1,671.0*    ProBNP (last 3 results) No results for input(s): "PROBNP" in the last 8760 hours.   Diagnostic Studies/Procedures   Echo 4/25 EF 20-25% severe RV dysfunction with septal flattening Mod-severe MR   RHC 4/25: elevated pressures bilaterally with preserved CO/CI on DBA 7.5 RA 19, PA 90/38 (55), PCWP 27 with v waves to 45, Fick CO/CI 6.6/2.7, TD CO/CI 6.4/2.66, PVR 4.3, PAPi 2.73  Discharge Medications   Allergies as of 05/16/2023       Reactions   Sulfa  Antibiotics Itching, Swelling, Rash   Lip swelling        Medication List     STOP taking these medications    empagliflozin  10 MG Tabs tablet Commonly known as: Jardiance    losartan  25 MG tablet Commonly known as: COZAAR    metoprolol  succinate 25 MG 24 hr tablet Commonly known as: TOPROL -XL   midodrine  2.5 MG tablet Commonly known as: PROAMATINE    torsemide  20 MG tablet Commonly known as: DEMADEX        TAKE these medications    albuterol  108 (90 Base) MCG/ACT  inhaler Commonly known as: VENTOLIN  HFA Inhale 2 puffs into the lungs every 2 (two) hours as needed for wheezing or shortness of breath.   atorvastatin  20 MG tablet Commonly known as: LIPITOR TAKE ONE TABLET BY MOUTH ONCE DAILY.   benzonatate  100 MG capsule Commonly known as: TESSALON  Take 1 capsule (100 mg total) by mouth 3 (three) times daily as needed for cough. Do not take with alcohol  or while operating or driving heavy machinery   bumetanide  2 MG tablet Commonly known as: BUMEX  Take 1 tablet (2 mg total) by mouth daily.  digoxin  0.125 MG tablet Commonly known as: LANOXIN  Take 1 tablet (0.125 mg total) by mouth daily. Start taking on: May 17, 2023   milrinone  20 MG/100 ML Soln infusion Commonly known as: PRIMACOR  Inject 0.0318 mg/min into the vein continuous.   potassium chloride  SA 20 MEQ tablet Commonly known as: KLOR-CON  M Take 2 tablets (40 mEq total) by mouth 2 (two) times daily. What changed: how much to take   spironolactone  25 MG tablet Commonly known as: ALDACTONE  Take 2 tablets (50 mg total) by mouth daily. What changed: how much to take   warfarin 10 MG tablet Commonly known as: COUMADIN  Take as directed. If you are unsure how to take this medication, talk to your nurse or doctor. Original instructions: Take 10 mg (1 tablet) by mouth daily Monday through Friday, then take 5 mg (1/2 tablet) by mouth on Saturday and on Sunday. What changed: See the new instructions.               Durable Medical Equipment  (From admission, onward)           Start     Ordered   05/14/23 1652  Heart failure home health orders  (Heart failure home health orders / Face to face)  Once       Comments: Heart Failure Follow-up Care:  Verify follow-up appointments per Patient Discharge Instructions. Confirm transportation arranged. Reconcile home medications with discharge medication list. Remove discontinued medications from use. Assist patient/caregiver to manage  medications using pill box. Reinforce low sodium food selection Assessments: Vital signs and oxygen saturation at each visit. Assess home environment for safety concerns, caregiver support and availability of low-sodium foods. Consult Social Worker, PT/OT, Dietitian, and CNA based on assessments. Perform comprehensive cardiopulmonary assessment. Notify MD for any change in condition or weight gain of 3 pounds in one day or 5 pounds in one week with symptoms. Daily Weights and Symptom Monitoring: Ensure patient has access to scales. Teach patient/caregiver to weigh daily before breakfast and after voiding using same scale and record.    Teach patient/caregiver to track weight and symptoms and when to notify Provider. Activity: Develop individualized activity plan with patient/caregiver.  [facility] to provide  Labs every other week to include BMET, Mg, and CBC with Diff. Additional as needed. Should be drawn via PERIPHERAL stick. NOT PICC line.   J2260 Milrinone 0.25 mcg/kg/min X 52 weeks A4221 Supplies for maintenance of drug infusion catheter A4222 Supplies for the external drug infusion per cassette or bag E0781 Ambulatory Infusion pump  Question Answer Comment  Heart Failure Follow-up Care Advanced Heart Failure ([facility]F) [facility] at [phone]   Obtain the following labs Basic Metabolic Panel   Lab frequency Other see comments   Fax lab results to AHF Clinic at [phone]   Diet Low Sodium Heart Healthy   Fluid restrictions: See other comments   Skilled Nurse to notify MD of weight trends weekly for first 2 weeks. May fax or call: AHF Clinic at [phone] (fax) or [phone]      04 /23/25 1655            Disposition   The patient will be discharged in stable condition to home.   Follow-up Information     [facility] Follow up.   Specialty: Home Health Services Why: Registered Nurse- IV Milrinone -office to call with visit times. Contact information: [address] [place] [address] [phone]         [facility] Follow up on 05/22/2023.   Specialty: Cardiology Why: at 10:00am [facility] information: [address] [place]  [phone]        [facility] Follow up.   Why: will call to arrange medication delivery to home Contact information: [phone]        Grooms, [place], PA-C Follow up.   Specialty: Physician Assistant Why: hospital follow up appointment 05/26/2023 at 4 pm please call to reschedule if you are unable to keep appt Contact information: [address] [place] [address] [phone]                   Duration of Discharge Encounter: 18 minutes  Signed, [name] Prudy Candy, NP 05/16/2023, 3:50 PM

## 2023-05-15 NOTE — Progress Notes (Addendum)
 Advanced Heart Failure Rounding Note  Cardiologist: Armida Lander, MD   Chief Complaint: Cardiogenic shock  Subjective:    CO-OX 75% on 0.25 milrinone .   Lasix  gtt stopped yesterday. Given 500 mg diamox  BID. Is/Os negative 1.3L for the day. CVP 9-10.  Feeling well. Dyspnea improving. Has already ambulated around the unit.  Objective:    Weight Range: 115.9 kg Body mass index is 36.66 kg/m.   Vital Signs:   Temp:  [97.6 F (36.4 C)-98.6 F (37 C)] 97.6 F (36.4 C) (04/24 0700) Pulse Rate:  [81-111] 87 (04/24 0800) Resp:  [13-28] 24 (04/24 0800) BP: (89-127)/(61-82) 104/61 (04/24 0800) SpO2:  [83 %-100 %] 96 % (04/24 0824) FiO2 (%):  [32 %] 32 % (04/23 2310) Weight:  [115.9 kg] 115.9 kg (04/24 0500) Last BM Date : 05/11/23  Weight change: Filed Weights   05/14/23 0500 05/14/23 0800 05/15/23 0500  Weight: 115.2 kg 115.2 kg 115.9 kg   Intake/Output:  Intake/Output Summary (Last 24 hours) at 05/15/2023 1013 Last data filed at 05/15/2023 0900 Gross per 24 hour  Intake 1148.88 ml  Output 2550 ml  Net -1401.12 ml    Physical Exam  General:  Chronically ill appearing. Neck: JVP ~ 10 cm Cor: Regular rate & rhythm. No rubs, gallops or murmurs. Lungs: clear Abdomen: obese, soft, nontender, nondistended.  Extremities: trace edema Neuro: alert & orientedx3. Affect pleasant   Telemetry   SR 80s  Labs    CBC Recent Labs    05/13/23 0420 05/13/23 0812 05/14/23 0408 05/14/23 0409  WBC 5.0  --   --  5.7  HGB 8.5*   < > 12.6 9.6*  HCT 31.4*   < > 37.0 35.1*  MCV 77.5*  --   --  76.5*  PLT 161  --   --  173   < > = values in this interval not displayed.   Basic Metabolic Panel Recent Labs    [birth year] 0420 05/13/23 0812 05/14/23 0409 05/14/23 1400 05/15/23 0428  NA 132*   < > 132* 138 130*  K 3.9   < > 3.3* 4.3 3.3*  CL 90*   < > 85* 105 87*  CO2 35*   < > 38* 25 33*  GLUCOSE 156*   < > 142* 193* 130*  BUN 17   < > 14 19 19   CREATININE 1.09*    < > 1.00 1.10* 1.16*  CALCIUM  8.6*   < > 9.1 8.8* 9.2  MG 2.2   < > 2.3  --  2.2  PHOS 4.2  --   --   --   --    < > = values in this interval not displayed.   Liver Function Tests No results for input(s): "AST", "ALT", "ALKPHOS", "BILITOT", "PROT", "ALBUMIN" in the last 72 hours.  BNP: BNP (last 3 results) Recent Labs    09/16/22 1145 12/31/22 0904 05/06/23 1603  BNP 1,157.1* 1,296.0* 1,671.0*   Hemoglobin A1C No results for input(s): "HGBA1C" in the last 72 hours.  Fasting Lipid Panel No results for input(s): "CHOL", "HDL", "LDLCALC", "TRIG", "CHOLHDL", "LDLDIRECT" in the last 72 hours.  Imaging   No results found.   Medications:    Scheduled Medications:  acetaZOLAMIDE   500 mg Oral BID   atorvastatin   20 mg Oral Daily   benzonatate   200 mg Oral TID   bumetanide   2 mg Oral BID   Chlorhexidine  Gluconate Cloth  6 each Topical Daily   digoxin   0.125 mg Oral Daily   insulin  aspart  0-15 Units Subcutaneous Q4H   ipratropium-albuterol   3 mL Nebulization BID   pantoprazole   40 mg Oral Daily   polyethylene glycol  17 g Oral Daily   potassium chloride   40 mEq Oral Q4H   senna-docusate  1 tablet Oral BID   sodium chloride  flush  10-40 mL Intracatheter Q12H   sodium chloride  flush  10-40 mL Intracatheter Q12H   spironolactone   50 mg Oral Daily   warfarin  5 mg Oral ONCE-1600   Warfarin - Pharmacist Dosing Inpatient   Does not apply q1600    Infusions:  milrinone  0.25 mcg/kg/min (05/15/23 0600)    PRN Medications: acetaminophen  **OR** acetaminophen , cyclobenzaprine , guaiFENesin -dextromethorphan , lip balm, ondansetron  **OR** ondansetron  (ZOFRAN ) IV, sodium chloride  flush, sodium chloride  flush, traMADol   Patient Profile   [name] [name] is a 48 y.o. 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 Admitted  to [facility] with A/C HFrEF with BiV dysfunction. Went into cardiogenic shock and was transferred to [facility].   Assessment/Plan   Acute on chronic systolic HF with biventricualr dysfunction   Cardiogenic shock - LHC in 2023 with nonobstructive CAD.  - RHC 9/24 RA 5 PA 53/21 (33) PCWP 21 Fick 4.6/2.0 TD 5.0/2.24 - Echo 4/25 EF 20-25% severe RV dysfunction with septal flattening Mod-severe MR - RHC 05/13/23 on 7.5 DBA: Severely elevated biventricular filling pressures, severe post capillary PH, normal CO by TD. Swan left in for hemodynamic monitoring, removed on 04/23. - DBA switched to milrinone  04/22. CO-OX 75% on 0.25 milrinone  - NYHA IV - CVP 9-10. Volume much improved. Will start po bumex  2 BID and give 500 mg diamox  again today - Continue digoxin  0.125 mg daily, dig level on 04/25 - Continue spiro 50 mg daily - Plan for home with milrinone  tomorrow. Will exchange PICC for single lumen. If able to demonstrate better compliance with follow-ups and maintain her line, could consider workup for LVAD in the future. - Check echo to reassess MR now that she is near euvolemic.  2. Severe MR - Current echo mod/sev MR - Reassess today   3. AKI:  - due to ATN shock - Resolved - Continue inotrope support  4. Hx of PE/DVT w/ IVC filter - Submassive saddle PE extending from the distal main pulmonary artery and to the left and right pulmonary arteries with extension into the left upper lobe, left lower lobe and lingula branches and right upper lobe, right middle lobe and right lower lobes on 04/27/2013  - Evaluated at [facility] where thrombophilia work-up was negative (Lupus inhibitor, anticardiolipin antibodies, and anti beta-2  glycoprotein antibodies were negative; protein C activity 85%, protein S activity 76%, factor V Leiden, and prothrombin gene mutation ) - IVC filter in place; could not be removed due to significant thrombus burden on filter at [facility].  - Previously advised against DOACs due to severity of  thrombus burden and weight >250lbs.  - INR 3.0. Continue Warfarin   5. Obesity  - Body mass index is 36.66 kg/m. - BMI trending down significantly with diuresis - consider GLP1-RA as outpatient   6. CAD - LHC in 5/23 w/ nonobstructive CAD - No s/s angina   7. Microcytic anemia - tSat 4, ferritin 16 - Give IV iron   8. Hypokalemia - K 3.3 today. Supp aggressively. - Continue spiro 50 mg  daily  CRITICAL CARE Performed by: Gerilyn Kobus N   Total critical care time: 14 minutes  Critical care time was exclusive of separately billable procedures and treating other patients.  Critical care was necessary to treat or prevent imminent or life-threatening deterioration.  Critical care was time spent personally by me on the following activities: development of treatment plan with patient and/or surrogate as well as nursing, discussions with consultants, evaluation of patient's response to treatment, examination of patient, obtaining history from patient or surrogate, ordering and performing treatments and interventions, ordering and review of laboratory studies, ordering and review of radiographic studies, pulse oximetry and re-evaluation of patient's condition.   Length of Stay: 9  Leola Fiore N, PA-C  05/15/2023, 10:13 AM  Advanced Heart Failure Team Pager [phone] (M-F; 7a - 5p)  Please contact [facility] for night-coverage after hours (5p -7a ) and weekends on [email or web address]

## 2023-05-15 NOTE — Consult Note (Signed)
 PHARMACY - ANTICOAGULATION CONSULT NOTE  Pharmacy Consult for Warfarin Indication: history of pulmonary embolus and DVT  Allergies  Allergen Reactions   Sulfa  Antibiotics Itching, Swelling and Rash    Lip swelling    Patient Measurements: Height: 5\' 10"  (177.8 cm) Weight: 115.2 kg (253 lb 15.5 oz) IBW/kg (Calculated) : 68.5 HEPARIN  DW (KG): 101.8  Vital Signs: Temp: 97.7 F (36.5 C) (04/24 0333) Temp Source: Axillary (04/24 0333) BP: 101/69 (04/24 0600) Pulse Rate: 81 (04/24 0600)  Labs: Recent Labs    05/13/23 0420 05/13/23 0812 05/13/23 2212 05/14/23 0408 05/14/23 0409 05/14/23 1400 05/15/23 0428  HGB 8.5* 11.6*  11.6*  --  12.6 9.6*  --   --   HCT 31.4* 34.0*  34.0*  --  37.0 35.1*  --   --   PLT 161  --   --   --  173  --   --   LABPROT 32.7*  --   --   --  31.5*  --  31.6*  INR 3.2*  --   --   --  3.0*  --  3.0*  CREATININE 1.09*  --    < >  --  1.00 1.10* 1.16*   < > = values in this interval not displayed.    Estimated Creatinine Clearance: 83.4 mL/min (A) (by C-G formula based on SCr of 1.16 mg/dL (H)).   Medical History: Past Medical History:  Diagnosis Date   Anemia    Blood transfusion without reported diagnosis    CAD (coronary artery disease)    Nonobstructive   Closed left ankle fracture    August 30 2012   Clotting disorder (HCC)    DVT (deep venous thrombosis) (HCC)    L leg   Fibroid tumor    HFrEF (heart failure with reduced ejection fraction) (HCC)    Hidradenitis    Hypertension    Mitral regurgitation    Myocardial infarction (HCC)    NICM (nonischemic cardiomyopathy) (HCC)    NSVT (nonsustained ventricular tachycardia) (HCC)    Obesity    OSA on CPAP    Pulmonary embolism (HCC) 04/2013   Sleep apnea     Medications: see MAR  Assessment: 48 y.o. female with medical history significant for systolic CHF, hypertension, DVT and PE on chronic anticoagulation with warfarin, IVC status, OSA on CPAP presented to the ED with  complaints of increasing difficulty breathing, and swelling to her bilateral lower extremities up to her thighs that started 2 days ago. She reports her baseline weight is about 274 pounds, she is weighing 299 here in the ED. She has had to sleep sitting up. Pharmacy has been consulted to manage warfarin while admitted.   PTA warfarin regimen = 15 mg Wed/Sat, 10 mg AOD (recently increased on 4/9 w/ INR 1.7 on 10 mg daily) -INR 5.3 on admit (not reversed), last warfarin dose 15 mg on 4/16 PM (after boosted dose of 15 mg on 4/15 from PTA regimen)  -Warfarin restarted 4/19 -INR therapeutic at 3.0 (+/-0) today after warfarin 5 mg x1 yesterday, CBC stable.  No s/sx bleeding.  Good appetite, eating all meals.  -Plans for potential tunneled catheter tomorrow. Will dose warfarin to avoid increase in INR  Goal of Therapy:  INR 2-3 Monitor platelets by anticoagulation protocol: Yes  Plan:  Give warfarin 5 mg x 1 INR/CBC daily Monitor for S/S of bleeding  Albino Alu, PharmD PGY2 Cardiology Pharmacy Resident 05/15/2023  6:26 AM

## 2023-05-16 ENCOUNTER — Inpatient Hospital Stay (HOSPITAL_COMMUNITY)

## 2023-05-16 ENCOUNTER — Other Ambulatory Visit (HOSPITAL_COMMUNITY): Payer: Self-pay

## 2023-05-16 DIAGNOSIS — I340 Nonrheumatic mitral (valve) insufficiency: Secondary | ICD-10-CM

## 2023-05-16 DIAGNOSIS — I5043 Acute on chronic combined systolic (congestive) and diastolic (congestive) heart failure: Secondary | ICD-10-CM | POA: Diagnosis not present

## 2023-05-16 DIAGNOSIS — I5023 Acute on chronic systolic (congestive) heart failure: Secondary | ICD-10-CM | POA: Diagnosis not present

## 2023-05-16 HISTORY — PX: IR FLUORO GUIDE CV LINE RIGHT: IMG2283

## 2023-05-16 HISTORY — PX: IR US GUIDE VASC ACCESS RIGHT: IMG2390

## 2023-05-16 LAB — CBC
HCT: 32.5 % — ABNORMAL LOW (ref 36.0–46.0)
Hemoglobin: 9 g/dL — ABNORMAL LOW (ref 12.0–15.0)
MCH: 21.1 pg — ABNORMAL LOW (ref 26.0–34.0)
MCHC: 27.7 g/dL — ABNORMAL LOW (ref 30.0–36.0)
MCV: 76.3 fL — ABNORMAL LOW (ref 80.0–100.0)
Platelets: 193 10*3/uL (ref 150–400)
RBC: 4.26 MIL/uL (ref 3.87–5.11)
RDW: 22.8 % — ABNORMAL HIGH (ref 11.5–15.5)
WBC: 4.9 10*3/uL (ref 4.0–10.5)
nRBC: 0 % (ref 0.0–0.2)

## 2023-05-16 LAB — BASIC METABOLIC PANEL WITH GFR
Anion gap: 10 (ref 5–15)
BUN: 21 mg/dL — ABNORMAL HIGH (ref 6–20)
CO2: 31 mmol/L (ref 22–32)
Calcium: 8.8 mg/dL — ABNORMAL LOW (ref 8.9–10.3)
Chloride: 90 mmol/L — ABNORMAL LOW (ref 98–111)
Creatinine, Ser: 1.08 mg/dL — ABNORMAL HIGH (ref 0.44–1.00)
GFR, Estimated: >60 mL/min (ref >60)
Glucose, Bld: 158 mg/dL — ABNORMAL HIGH (ref 70–99)
Potassium: 3.5 mmol/L (ref 3.5–5.1)
Sodium: 131 mmol/L — ABNORMAL LOW (ref 135–145)

## 2023-05-16 LAB — COOXEMETRY PANEL
Carboxyhemoglobin: 2.1 % — ABNORMAL HIGH (ref 0.5–1.5)
Methemoglobin: <0.7 % (ref 0.0–1.5)
O2 Saturation: 67.7 %
Total hemoglobin: 9.5 g/dL — ABNORMAL LOW (ref 12.0–16.0)

## 2023-05-16 LAB — ECHOCARDIOGRAM COMPLETE
AR max vel: 2.92 cm2
AV Peak grad: 9.1 mmHg
Ao pk vel: 1.51 m/s
Area-P 1/2: 4.89 cm2
Calc EF: 30.6 %
Height: 70 in
S' Lateral: 6.8 cm
Single Plane A2C EF: 27.3 %
Single Plane A4C EF: 30.6 %
Weight: 3911.84 [oz_av]

## 2023-05-16 LAB — GLUCOSE, CAPILLARY
Glucose-Capillary: 156 mg/dL — ABNORMAL HIGH (ref 70–99)
Glucose-Capillary: 117 mg/dL — ABNORMAL HIGH (ref 70–99)
Glucose-Capillary: 94 mg/dL (ref 70–99)

## 2023-05-16 LAB — DIGOXIN LEVEL: Digoxin Level: 0.2 ng/mL — ABNORMAL LOW (ref 0.8–2.0)

## 2023-05-16 LAB — PROTIME-INR
INR: 2.6 — ABNORMAL HIGH (ref 0.8–1.2)
Prothrombin Time: 27.8 s — ABNORMAL HIGH (ref 11.4–15.2)

## 2023-05-16 MED ORDER — HEPARIN SOD (PORK) LOCK FLUSH 100 UNIT/ML IV SOLN
INTRAVENOUS | Status: AC
  Filled 2023-05-16: qty 5

## 2023-05-16 MED ORDER — SODIUM CHLORIDE 0.9 % IV SOLN
500.0000 mg | INTRAVENOUS | Status: AC
Start: 2023-05-16 — End: 2023-05-16
  Administered 2023-05-16: 500 mg via INTRAVENOUS
  Filled 2023-05-16: qty 25

## 2023-05-16 MED ORDER — LIDOCAINE HCL 1 % IJ SOLN
INTRAMUSCULAR | Status: AC
Start: 2023-05-16 — End: ?
  Filled 2023-05-16: qty 20

## 2023-05-16 MED ORDER — WARFARIN SODIUM 10 MG PO TABS
ORAL_TABLET | ORAL | 4 refills | Status: DC
  Filled 2023-05-16: qty 70, 81d supply, fill #0

## 2023-05-16 MED ORDER — POTASSIUM CHLORIDE CRYS ER 20 MEQ PO TBCR
40.0000 meq | EXTENDED_RELEASE_TABLET | Freq: Two times a day (BID) | ORAL | 6 refills | Status: DC
  Filled 2023-05-16: qty 120, 30d supply, fill #0

## 2023-05-16 MED ORDER — POTASSIUM CHLORIDE CRYS ER 20 MEQ PO TBCR
40.0000 meq | EXTENDED_RELEASE_TABLET | ORAL | Status: DC
Start: 2023-05-16 — End: 2023-05-16
  Administered 2023-05-16 (×2): 40 meq via ORAL
  Filled 2023-05-16 (×2): qty 2

## 2023-05-16 MED ORDER — WARFARIN SODIUM 5 MG PO TABS
10.0000 mg | ORAL_TABLET | Freq: Once | ORAL | Status: AC
  Administered 2023-05-16: 10 mg via ORAL
  Filled 2023-05-16: qty 2

## 2023-05-16 MED ORDER — LIDOCAINE HCL 1 % IJ SOLN
20.0000 mL | Freq: Once | INTRAMUSCULAR | Status: DC
  Filled 2023-05-16: qty 20

## 2023-05-16 MED ORDER — ONDANSETRON HCL 4 MG/2ML IJ SOLN
INTRAMUSCULAR | Status: AC
  Filled 2023-05-16: qty 2

## 2023-05-16 MED ORDER — SPIRONOLACTONE 25 MG PO TABS
50.0000 mg | ORAL_TABLET | Freq: Every day | ORAL | 4 refills | Status: DC
  Filled 2023-05-16: qty 180, 90d supply, fill #0

## 2023-05-16 MED ORDER — BUMETANIDE 2 MG PO TABS
2.0000 mg | ORAL_TABLET | Freq: Every day | ORAL | Status: DC
  Administered 2023-05-16: 2 mg via ORAL
  Filled 2023-05-16: qty 1

## 2023-05-16 MED ORDER — MILRINONE LACTATE IN DEXTROSE 20-5 MG/100ML-% IV SOLN: 0.2500 ug/kg/min | INTRAVENOUS | Status: DC

## 2023-05-16 MED ORDER — DIGOXIN 125 MCG PO TABS
0.1250 mg | ORAL_TABLET | Freq: Every day | ORAL | 4 refills | Status: DC
  Filled 2023-05-16: qty 90, 90d supply, fill #0

## 2023-05-16 MED ORDER — IPRATROPIUM-ALBUTEROL 0.5-2.5 (3) MG/3ML IN SOLN: 3.0000 mL | RESPIRATORY_TRACT | Status: DC | PRN

## 2023-05-16 MED ORDER — BUMETANIDE 2 MG PO TABS
2.0000 mg | ORAL_TABLET | Freq: Every day | ORAL | 4 refills | Status: DC
Start: 2023-05-16 — End: 2023-08-05
  Filled 2023-05-16: qty 90, 90d supply, fill #0

## 2023-05-16 NOTE — Procedures (Signed)
 Interventional Radiology Procedure:   Indications: Acute on chronic heart failure, needs long term central line.   Procedure: Placement of tunneled central line  Findings: Patient had nausea and emesis at beginning of the procedure.  Patient was given Zofran  and right neck and chest was re-prepped. Right jugular Powerline (dual lumen) placed.  Tip at SVC/RA junction.    Complications: None     EBL: Minimal  Plan: Powerline is ready to use.   Dameon Soltis R. Julietta Ogren, MD  Pager: [phone]

## 2023-05-16 NOTE — Progress Notes (Signed)
 Echocardiogram 2D Echocardiogram has been performed.  [name] [name] 05/16/2023, 10:54 AM

## 2023-05-16 NOTE — Progress Notes (Signed)
 Right PICC removed per MD order. CVC placed today by IR in place and infusing.   Pressure dressing applied and pressure held. No bleeding noted. Return precautions reviewed. Instructed to remain in bed for 30 minutes. Keep dressing clean and intact for 48 hours.   Arwyn Besaw R Glorimar Stroope, RN

## 2023-05-16 NOTE — Progress Notes (Signed)
 Advanced Heart Failure Rounding Note  Cardiologist: Armida Lander, MD  Chief Complaint: Cardiogenic shock Subjective:    Coox 68% on 0.25 milrinone . CVP 6 7.8L UOP. Net negative 6.8L. Weight down 60 lbs overall. sCr stable 1.08  Sitting up in the bed. Feeling well this morning. Breathing much improved. No pain.  Objective:    Weight Range: 110.9 kg Body mass index is 35.08 kg/m.   Vital Signs:   Temp:  [97.4 F (36.3 C)-98.6 F (37 C)] 98.3 F (36.8 C) (04/25 0418) Pulse Rate:  [79-95] 85 (04/25 0700) Resp:  [14-29] 16 (04/25 0700) BP: (103-129)/(55-103) 129/83 (04/25 0300) SpO2:  [86 %-100 %] 98 % (04/25 0700) FiO2 (%):  [32 %] 32 % (04/25 0100) Weight:  [110.9 kg-115.3 kg] 110.9 kg (04/25 0500) Last BM Date : 05/13/23  Weight change: Filed Weights   05/15/23 0500 05/15/23 0800 05/16/23 0500  Weight: 115.9 kg 115.3 kg 110.9 kg   Intake/Output:  Intake/Output Summary (Last 24 hours) at 05/16/2023 0750 Last data filed at 05/16/2023 0741 Gross per 24 hour  Intake 1313.11 ml  Output 7800 ml  Net -6486.89 ml    Physical Exam   General: Well appearing. No distress on RA Cardiac: JVP flat. S1 and S2 present. 3/6 Systolic murmur at apex Extremities: Warm and dry.  No peripheral edema. +TED hose Neuro: Alert and oriented x3. Affect pleasant. Moves all extremities without difficulty.  Telemetry   SR in 80s (personally reviewed)  Labs    CBC Recent Labs    05/14/23 0409 05/16/23 0408  WBC 5.7 4.9  HGB 9.6* 9.0*  HCT 35.1* 32.5*  MCV 76.5* 76.3*  PLT 173 193   Basic Metabolic Panel Recent Labs    01/22/70 0409 05/14/23 1400 05/15/23 0428 05/15/23 1649 05/16/23 0408  NA 132*   < > 130* 133* 131*  K 3.3*   < > 3.3* 3.5 3.5  CL 85*   < > 87* 91* 90*  CO2 38*   < > 33* 33* 31  GLUCOSE 142*   < > 130* 122* 158*  BUN 14   < > 19 16 21*  CREATININE 1.00   < > 1.16* 1.06* 1.08*  CALCIUM  9.1   < > 9.2 8.7* 8.8*  MG 2.3  --  2.2  --   --    < >  = values in this interval not displayed.   BNP: BNP (last 3 results) Recent Labs    09/16/22 1145 12/31/22 0904 05/06/23 1603  BNP 1,157.1* 1,296.0* 1,671.0*   Imaging   No results found.  Medications:    Scheduled Medications:  acetaZOLAMIDE   500 mg Oral BID   atorvastatin   20 mg Oral Daily   benzonatate   200 mg Oral TID   bumetanide   2 mg Oral Daily   Chlorhexidine  Gluconate Cloth  6 each Topical Daily   digoxin   0.125 mg Oral Daily   insulin  aspart  0-9 Units Subcutaneous TID WC   ipratropium-albuterol   3 mL Nebulization BID   lidocaine   10 mL Intradermal Once   pantoprazole   40 mg Oral Daily   polyethylene glycol  17 g Oral Daily   potassium chloride   40 mEq Oral Q4H   senna-docusate  1 tablet Oral BID   sodium chloride  flush  10-40 mL Intracatheter Q12H   sodium chloride  flush  10-40 mL Intracatheter Q12H   spironolactone   50 mg Oral Daily   warfarin  10 mg Oral ONCE-1600   Warfarin -  Pharmacist Dosing Inpatient   Does not apply q1600   Infusions:  iron  sucrose (VENOFER ) 500 mg in sodium chloride  0.9 % 250 mL IVPB     milrinone  0.25 mcg/kg/min (05/16/23 0547)   PRN Medications: acetaminophen  **OR** acetaminophen , cyclobenzaprine , guaiFENesin -dextromethorphan , lip balm, ondansetron  **OR** ondansetron  (ZOFRAN ) IV, sodium chloride  flush, sodium chloride  flush, traMADol   Patient Profile   [name] [name] is a 48 y.o. 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 Admitted to [facility] ICU with A/C HFrEF with BiV dysfunction. Went into cardiogenic shock and was transferred to [facility].   Assessment/Plan   Acute on chronic systolic HF with biventricualr dysfunction   Cardiogenic shock - LHC in 2023 with nonobstructive CAD.  - RHC 9/24 RA 5 PA 53/21 (33) PCWP 21 Fick 4.6/2.0 TD 5.0/2.24 - Echo 4/25 EF 20-25% severe  RV dysfunction with septal flattening Mod-severe MR - RHC 05/13/23 on 7.5 DBA: Severely elevated biventricular filling pressures, severe post capillary PH, normal CO by TD. Swan left in for hemodynamic monitoring, removed on 04/23. - DBA switched to milrinone  4/22. Co-ox 68% on 0.25 milrinone  - CVP 6. Great response to PO Bumex . Decrease to Bumex  2 mg daily.  - Continue digoxin  0.125 mg daily, dig level on 4/25 - Continue spiro 50 mg daily - Echo ordered to assess MR now that she is near euvolemic. - Plan for home with milrinone  today. Exchanging PICC for single lumen. If able to demonstrate better compliance with follow-ups and maintain her line, could consider workup for LVAD in the future.  2. Severe MR - Current echo mod/sev MR - 3/6 systolic apex murmur on exam - Echo today.    3. AKI:  - due to ATN shock - Continue inotrope support - Resolved  4. Hx of PE/DVT w/ IVC filter - Submassive saddle PE extending from the distal main pulmonary artery and to the left and right pulmonary arteries with extension into the left upper lobe, left lower lobe and lingula branches and right upper lobe, right middle lobe and right lower lobes on 04/27/2013  - Evaluated at [facility] where thrombophilia work-up was negative (Lupus inhibitor, anticardiolipin antibodies, and anti beta-2  glycoprotein antibodies were negative; protein C activity 85%, protein S activity 76%, factor V Leiden, and prothrombin gene mutation ) - IVC filter in place; could not be removed due to significant thrombus burden on filter at [facility].  - Previously advised against DOACs due to severity of thrombus burden and weight >250lbs.  - INR 2.6. Continue Warfarin   5. Obesity  - Body mass index is 35.08 kg/m. - BMI significantly down with diuresis - consider GLP1-RA as outpatient   6. CAD - LHC in 5/23 w/ nonobstructive CAD - No s/s angina   7. Microcytic anemia - tSat 4, ferritin 16 - Recieved IV iron   8. Hypokalemia - K 3.5  today. Supp aggressively. - Continue spiro 50 mg daily  Length of Stay: 10  [name] Loreli Debruler, NP  05/16/2023, 7:50 AM  Advanced Heart Failure Team Pager [phone] (M-F; 7a - 5p)  Please contact [facility] for night-coverage after hours (5p -7a ) and weekends on [email or web address]

## 2023-05-16 NOTE — Consult Note (Signed)
 PHARMACY - ANTICOAGULATION CONSULT NOTE  Pharmacy Consult for Warfarin Indication: history of pulmonary embolus and DVT  Allergies  Allergen Reactions   Sulfa  Antibiotics Itching, Swelling and Rash    Lip swelling    Patient Measurements: Height: 5\' 10"  (177.8 cm) Weight: 110.9 kg (244 lb 7.8 oz) IBW/kg (Calculated) : 68.5 HEPARIN  DW (KG): 101.8  Vital Signs: Temp: 98.3 F (36.8 C) (04/25 0418) Temp Source: Axillary (04/25 0418) BP: 129/83 (04/25 0300) Pulse Rate: 85 (04/25 0700)  Labs: Recent Labs    05/14/23 0408 05/14/23 0409 05/14/23 1400 05/15/23 0428 05/15/23 1649 05/16/23 0408  HGB 12.6 9.6*  --   --   --  9.0*  HCT 37.0 35.1*  --   --   --  32.5*  PLT  --  173  --   --   --  193  LABPROT  --  31.5*  --  31.6*  --  27.8*  INR  --  3.0*  --  3.0*  --  2.6*  CREATININE  --  1.00   < > 1.16* 1.06* 1.08*   < > = values in this interval not displayed.    Estimated Creatinine Clearance: 87.9 mL/min (A) (by C-G formula based on SCr of 1.08 mg/dL (H)).   Medical History: Past Medical History:  Diagnosis Date   Anemia    Blood transfusion without reported diagnosis    CAD (coronary artery disease)    Nonobstructive   Closed left ankle fracture    August 30 2012   Clotting disorder (HCC)    DVT (deep venous thrombosis) (HCC)    L leg   Fibroid tumor    HFrEF (heart failure with reduced ejection fraction) (HCC)    Hidradenitis    Hypertension    Mitral regurgitation    Myocardial infarction (HCC)    NICM (nonischemic cardiomyopathy) (HCC)    NSVT (nonsustained ventricular tachycardia) (HCC)    Obesity    OSA on CPAP    Pulmonary embolism (HCC) 04/2013   Sleep apnea     Medications: see MAR  Assessment: 48 y.o. female with medical history significant for systolic CHF, hypertension, DVT and PE on chronic anticoagulation with warfarin, IVC status, OSA on CPAP presented to the ED with complaints of increasing difficulty breathing, and swelling to her  bilateral lower extremities up to her thighs that started 2 days ago. She reports her baseline weight is about 274 pounds, she is weighing 299 here in the ED. She has had to sleep sitting up. Pharmacy has been consulted to manage warfarin while admitted.   PTA warfarin regimen = 15 mg Wed/Sat, 10 mg AOD (recently increased on 4/9 w/ INR 1.7 on 10 mg daily) -INR 5.3 on admit (not reversed), last warfarin dose 15 mg on 4/16 PM (after boosted dose of 15 mg on 4/15 from PTA regimen)  -Warfarin restarted 4/19 -INR therapeutic at 2.6 (-0.4) today after warfarin 5 mg x1 yesterday, CBC stable.  No s/sx bleeding.  Good appetite, eating all meals.  -Plans for potential tunneled catheter tomorrow. Will dose warfarin to avoid increase in INR  Goal of Therapy:  INR 2-3 Monitor platelets by anticoagulation protocol: Yes  Plan:  Give warfarin 10 mg x 1 INR/CBC daily Monitor for S/S of bleeding  Albino Alu, PharmD PGY2 Cardiology Pharmacy Resident 05/16/2023  7:46 AM

## 2023-05-16 NOTE — Plan of Care (Signed)
  Problem: Activity: Goal: Risk for activity intolerance will decrease Outcome: Progressing   Problem: Nutrition: Goal: Adequate nutrition will be maintained Outcome: Progressing   Problem: Coping: Goal: Level of anxiety will decrease Outcome: Progressing   Problem: Safety: Goal: Ability to remain free from injury will improve Outcome: Progressing   Problem: Activity: Goal: Capacity to carry out activities will improve Outcome: Progressing

## 2023-05-16 NOTE — Plan of Care (Signed)
  Problem: Education: Goal: Knowledge of General Education information will improve Description: Including pain rating scale, medication(s)/side effects and non-pharmacologic comfort measures Outcome: Adequate for Discharge   Problem: Health Behavior/Discharge Planning: Goal: Ability to manage health-related needs will improve Outcome: Adequate for Discharge   Problem: Clinical Measurements: Goal: Ability to maintain clinical measurements within normal limits will improve Outcome: Adequate for Discharge Goal: Will remain free from infection Outcome: Adequate for Discharge Goal: Diagnostic test results will improve Outcome: Adequate for Discharge Goal: Respiratory complications will improve Outcome: Adequate for Discharge Goal: Cardiovascular complication will be avoided Outcome: Adequate for Discharge   Problem: Activity: Goal: Risk for activity intolerance will decrease Outcome: Adequate for Discharge   Problem: Nutrition: Goal: Adequate nutrition will be maintained Outcome: Adequate for Discharge   Problem: Coping: Goal: Level of anxiety will decrease Outcome: Adequate for Discharge   Problem: Elimination: Goal: Will not experience complications related to bowel motility Outcome: Adequate for Discharge Goal: Will not experience complications related to urinary retention Outcome: Adequate for Discharge   Problem: Pain Managment: Goal: General experience of comfort will improve and/or be controlled Outcome: Adequate for Discharge   Problem: Safety: Goal: Ability to remain free from injury will improve Outcome: Adequate for Discharge   Problem: Skin Integrity: Goal: Risk for impaired skin integrity will decrease Outcome: Adequate for Discharge   Problem: Education: Goal: Ability to demonstrate management of disease process will improve Outcome: Adequate for Discharge Goal: Ability to verbalize understanding of medication therapies will improve Outcome: Adequate  for Discharge Goal: Individualized Educational Video(s) Outcome: Adequate for Discharge   Problem: Activity: Goal: Capacity to carry out activities will improve Outcome: Adequate for Discharge   Problem: Cardiac: Goal: Ability to achieve and maintain adequate cardiopulmonary perfusion will improve Outcome: Adequate for Discharge   Problem: Education: Goal: Ability to describe self-care measures that may prevent or decrease complications (Diabetes Survival Skills Education) will improve Outcome: Adequate for Discharge Goal: Individualized Educational Video(s) Outcome: Adequate for Discharge   Problem: Coping: Goal: Ability to adjust to condition or change in health will improve Outcome: Adequate for Discharge   Problem: Fluid Volume: Goal: Ability to maintain a balanced intake and output will improve Outcome: Adequate for Discharge   Problem: Health Behavior/Discharge Planning: Goal: Ability to identify and utilize available resources and services will improve Outcome: Adequate for Discharge Goal: Ability to manage health-related needs will improve Outcome: Adequate for Discharge   Problem: Metabolic: Goal: Ability to maintain appropriate glucose levels will improve Outcome: Adequate for Discharge   Problem: Nutritional: Goal: Maintenance of adequate nutrition will improve Outcome: Adequate for Discharge Goal: Progress toward achieving an optimal weight will improve Outcome: Adequate for Discharge   Problem: Skin Integrity: Goal: Risk for impaired skin integrity will decrease Outcome: Adequate for Discharge   Problem: Tissue Perfusion: Goal: Adequacy of tissue perfusion will improve Outcome: Adequate for Discharge   Problem: Education: Goal: Understanding of CV disease, CV risk reduction, and recovery process will improve Outcome: Adequate for Discharge Goal: Individualized Educational Video(s) Outcome: Adequate for Discharge   Problem: Activity: Goal: Ability  to return to baseline activity level will improve Outcome: Adequate for Discharge   Problem: Cardiovascular: Goal: Ability to achieve and maintain adequate cardiovascular perfusion will improve Outcome: Adequate for Discharge Goal: Vascular access site(s) Level 0-1 will be maintained Outcome: Adequate for Discharge   Problem: Health Behavior/Discharge Planning: Goal: Ability to safely manage health-related needs after discharge will improve Outcome: Adequate for Discharge

## 2023-05-17 DIAGNOSIS — I5082 Biventricular heart failure: Secondary | ICD-10-CM | POA: Diagnosis not present

## 2023-05-17 DIAGNOSIS — F17210 Nicotine dependence, cigarettes, uncomplicated: Secondary | ICD-10-CM | POA: Diagnosis not present

## 2023-05-17 DIAGNOSIS — R1310 Dysphagia, unspecified: Secondary | ICD-10-CM | POA: Diagnosis not present

## 2023-05-17 DIAGNOSIS — D509 Iron deficiency anemia, unspecified: Secondary | ICD-10-CM | POA: Diagnosis not present

## 2023-05-17 DIAGNOSIS — M5410 Radiculopathy, site unspecified: Secondary | ICD-10-CM | POA: Diagnosis not present

## 2023-05-17 DIAGNOSIS — M51369 Other intervertebral disc degeneration, lumbar region without mention of lumbar back pain or lower extremity pain: Secondary | ICD-10-CM | POA: Diagnosis not present

## 2023-05-17 DIAGNOSIS — E876 Hypokalemia: Secondary | ICD-10-CM | POA: Diagnosis not present

## 2023-05-17 DIAGNOSIS — G894 Chronic pain syndrome: Secondary | ICD-10-CM | POA: Diagnosis not present

## 2023-05-17 DIAGNOSIS — M47816 Spondylosis without myelopathy or radiculopathy, lumbar region: Secondary | ICD-10-CM | POA: Diagnosis not present

## 2023-05-17 DIAGNOSIS — Z6835 Body mass index (BMI) 35.0-35.9, adult: Secondary | ICD-10-CM | POA: Diagnosis not present

## 2023-05-17 DIAGNOSIS — M5442 Lumbago with sciatica, left side: Secondary | ICD-10-CM | POA: Diagnosis not present

## 2023-05-17 DIAGNOSIS — I5084 End stage heart failure: Secondary | ICD-10-CM | POA: Diagnosis not present

## 2023-05-17 DIAGNOSIS — G4733 Obstructive sleep apnea (adult) (pediatric): Secondary | ICD-10-CM | POA: Diagnosis not present

## 2023-05-17 DIAGNOSIS — M5441 Lumbago with sciatica, right side: Secondary | ICD-10-CM | POA: Diagnosis not present

## 2023-05-17 DIAGNOSIS — I051 Rheumatic mitral insufficiency: Secondary | ICD-10-CM | POA: Diagnosis not present

## 2023-05-17 DIAGNOSIS — M48061 Spinal stenosis, lumbar region without neurogenic claudication: Secondary | ICD-10-CM | POA: Diagnosis not present

## 2023-05-17 DIAGNOSIS — I2721 Secondary pulmonary arterial hypertension: Secondary | ICD-10-CM | POA: Diagnosis not present

## 2023-05-17 DIAGNOSIS — I428 Other cardiomyopathies: Secondary | ICD-10-CM | POA: Diagnosis not present

## 2023-05-17 DIAGNOSIS — I252 Old myocardial infarction: Secondary | ICD-10-CM | POA: Diagnosis not present

## 2023-05-17 DIAGNOSIS — I2510 Atherosclerotic heart disease of native coronary artery without angina pectoris: Secondary | ICD-10-CM | POA: Diagnosis not present

## 2023-05-17 DIAGNOSIS — Z452 Encounter for adjustment and management of vascular access device: Secondary | ICD-10-CM | POA: Diagnosis not present

## 2023-05-17 DIAGNOSIS — I5043 Acute on chronic combined systolic (congestive) and diastolic (congestive) heart failure: Secondary | ICD-10-CM | POA: Diagnosis not present

## 2023-05-17 DIAGNOSIS — I110 Hypertensive heart disease with heart failure: Secondary | ICD-10-CM | POA: Diagnosis not present

## 2023-05-17 DIAGNOSIS — I4729 Other ventricular tachycardia: Secondary | ICD-10-CM | POA: Diagnosis not present

## 2023-05-19 ENCOUNTER — Telehealth: Payer: Self-pay

## 2023-05-19 DIAGNOSIS — M51369 Other intervertebral disc degeneration, lumbar region without mention of lumbar back pain or lower extremity pain: Secondary | ICD-10-CM | POA: Diagnosis not present

## 2023-05-19 DIAGNOSIS — G894 Chronic pain syndrome: Secondary | ICD-10-CM | POA: Diagnosis not present

## 2023-05-19 DIAGNOSIS — E876 Hypokalemia: Secondary | ICD-10-CM | POA: Diagnosis not present

## 2023-05-19 DIAGNOSIS — M5442 Lumbago with sciatica, left side: Secondary | ICD-10-CM | POA: Diagnosis not present

## 2023-05-19 DIAGNOSIS — M48061 Spinal stenosis, lumbar region without neurogenic claudication: Secondary | ICD-10-CM | POA: Diagnosis not present

## 2023-05-19 DIAGNOSIS — I110 Hypertensive heart disease with heart failure: Secondary | ICD-10-CM | POA: Diagnosis not present

## 2023-05-19 DIAGNOSIS — M5441 Lumbago with sciatica, right side: Secondary | ICD-10-CM | POA: Diagnosis not present

## 2023-05-19 DIAGNOSIS — M47816 Spondylosis without myelopathy or radiculopathy, lumbar region: Secondary | ICD-10-CM | POA: Diagnosis not present

## 2023-05-19 DIAGNOSIS — R1310 Dysphagia, unspecified: Secondary | ICD-10-CM | POA: Diagnosis not present

## 2023-05-19 DIAGNOSIS — I5082 Biventricular heart failure: Secondary | ICD-10-CM | POA: Diagnosis not present

## 2023-05-19 DIAGNOSIS — D509 Iron deficiency anemia, unspecified: Secondary | ICD-10-CM | POA: Diagnosis not present

## 2023-05-19 DIAGNOSIS — I5084 End stage heart failure: Secondary | ICD-10-CM | POA: Diagnosis not present

## 2023-05-19 DIAGNOSIS — M5410 Radiculopathy, site unspecified: Secondary | ICD-10-CM | POA: Diagnosis not present

## 2023-05-19 DIAGNOSIS — I4729 Other ventricular tachycardia: Secondary | ICD-10-CM | POA: Diagnosis not present

## 2023-05-19 DIAGNOSIS — I252 Old myocardial infarction: Secondary | ICD-10-CM | POA: Diagnosis not present

## 2023-05-19 DIAGNOSIS — I428 Other cardiomyopathies: Secondary | ICD-10-CM | POA: Diagnosis not present

## 2023-05-19 DIAGNOSIS — I2721 Secondary pulmonary arterial hypertension: Secondary | ICD-10-CM | POA: Diagnosis not present

## 2023-05-19 DIAGNOSIS — I2510 Atherosclerotic heart disease of native coronary artery without angina pectoris: Secondary | ICD-10-CM | POA: Diagnosis not present

## 2023-05-19 DIAGNOSIS — I051 Rheumatic mitral insufficiency: Secondary | ICD-10-CM | POA: Diagnosis not present

## 2023-05-19 DIAGNOSIS — G4733 Obstructive sleep apnea (adult) (pediatric): Secondary | ICD-10-CM | POA: Diagnosis not present

## 2023-05-19 DIAGNOSIS — Z6835 Body mass index (BMI) 35.0-35.9, adult: Secondary | ICD-10-CM | POA: Diagnosis not present

## 2023-05-19 DIAGNOSIS — I5043 Acute on chronic combined systolic (congestive) and diastolic (congestive) heart failure: Secondary | ICD-10-CM | POA: Diagnosis not present

## 2023-05-19 DIAGNOSIS — F17210 Nicotine dependence, cigarettes, uncomplicated: Secondary | ICD-10-CM | POA: Diagnosis not present

## 2023-05-19 DIAGNOSIS — Z452 Encounter for adjustment and management of vascular access device: Secondary | ICD-10-CM | POA: Diagnosis not present

## 2023-05-19 NOTE — TOC Transition Note (Signed)
 Transition of Care [facility]) - Discharge Note   Patient Details  Name: [name] [name] MRN: [id number] Date of Birth: 1975-08-11  Transition of Care [facility]) CM/SW Contact:  Benjiman Bras, RN Phone Number: [phone] 05/19/2023, 9:00 AM   Clinical Narrative:     TOC CM notified [employer] rep, Pam that pt scheduled dc home today with Home Milrinone . Spoke to [employer], [place] and [facility] in [place].   Final next level of care: Home w Home Health Services Barriers to Discharge: No Barriers Identified   Patient Goals and CMS Choice Patient states their goals for this hospitalization and ongoing recovery are:: wants to feel better          Discharge Placement                       Discharge Plan and Services Additional resources added to the After Visit Summary for     Discharge Planning Services: CM Consult Post Acute Care Choice: Home Health                    HH Arranged: RN, Disease Management HH Agency: Surveyor, mining, [facility] Date [facility] Agency Contacted: 05/16/23 Time HH Agency Contacted: [phone] Representative spoke with at [facility] Agency: Kay Parson RN, [employer] Rep  Social Drivers of Health (SDOH) Interventions SDOH Screenings   Food Insecurity: No Food Insecurity (05/06/2023)  Housing: Low Risk  (05/06/2023)  Transportation Needs: No Transportation Needs (05/06/2023)  Utilities: Not At Risk (05/06/2023)  Alcohol  Screen: Low Risk  (04/03/2023)  Depression (PHQ2-9): Low Risk  (09/13/2022)  Financial Resource Strain: Low Risk  (04/03/2023)  Physical Activity: Insufficiently Active (04/03/2023)  Social Connections: Socially Isolated (04/03/2023)  Stress: No Stress Concern Present (04/03/2023)  Tobacco Use: High Risk (05/06/2023)     Readmission Risk Interventions    07/03/2022   12:12 PM 02/13/2022   12:41 PM 12/31/2021    1:52 PM  Readmission Risk Prevention Plan  Transportation Screening Complete Complete   HRI or Home Care Consult Complete Complete Complete   Social Work Consult for Recovery Care Planning/Counseling Complete Complete Complete  Palliative Care Screening Not Applicable Not Applicable Not Applicable  Medication Review Oceanographer) Complete Complete Complete

## 2023-05-19 NOTE — Transitions of Care (Post Inpatient/ED Visit) (Signed)
   05/19/2023  Name: [name] [name] MRN: [id number] DOB: 25-Jun-1975  Today's TOC FU Call Status: Today's TOC FU Call Status:: Unsuccessful Call (1st Attempt) Unsuccessful Call (1st Attempt) Date: 05/19/23  Attempted to reach the patient regarding the most recent Inpatient/ED visit.  Follow Up Plan: Additional outreach attempts will be made to reach the patient to complete the Transitions of Care (Post Inpatient/ED visit) call.   Orpha Blade, RN, BSN, CEN [employer]- Transition of Care Team.  [facility] [phone]

## 2023-05-20 ENCOUNTER — Other Ambulatory Visit: Payer: Self-pay | Admitting: Family Medicine

## 2023-05-20 ENCOUNTER — Ambulatory Visit: Attending: Cardiology | Admitting: *Deleted

## 2023-05-20 ENCOUNTER — Telehealth: Payer: Self-pay

## 2023-05-20 DIAGNOSIS — Z86718 Personal history of other venous thrombosis and embolism: Secondary | ICD-10-CM | POA: Diagnosis not present

## 2023-05-20 DIAGNOSIS — Z86711 Personal history of pulmonary embolism: Secondary | ICD-10-CM | POA: Diagnosis not present

## 2023-05-20 DIAGNOSIS — Z5181 Encounter for therapeutic drug level monitoring: Secondary | ICD-10-CM | POA: Diagnosis not present

## 2023-05-20 LAB — POCT INR: INR: 3.4 — AB (ref 2.0–3.0)

## 2023-05-20 NOTE — Transitions of Care (Post Inpatient/ED Visit) (Signed)
 05/20/2023  Name: [name] [name] MRN: [id number] DOB: 11/19/1975  Today's TOC FU Call Status: Today's TOC FU Call Status:: Successful TOC FU Call Completed TOC FU Call Complete Date: 05/20/23 Patient's Name and Date of Birth confirmed.  Transition Care Management Follow-up Telephone Call Date of Discharge: 05/16/23 Discharge Facility: [facility]) Type of Discharge: Inpatient Admission Primary Inpatient Discharge Diagnosis:: Heart Failure How have you been since you were released from the hospital?: Better (Reports decrease in fluid. Continues to cough. Some shortness of breath today.  Weight 243.5 pounds) Any questions or concerns?: No  Items Reviewed: Did you receive and understand the discharge instructions provided?: Yes Medications obtained,verified, and reconciled?: Yes (Medications Reviewed) Any new allergies since your discharge?: No Dietary orders reviewed?: Yes Type of Diet Ordered:: low sodium heart healthy diet Do you have support at home?: Yes People in Home [RPT]: parent(s)  Medications Reviewed Today: Medications Reviewed Today     Reviewed by Vanetta Generous, RN (Registered Nurse) on 05/20/23 at 1047  Med List Status: <None>   Medication Order Taking? Sig Documenting Provider Last Dose Status Informant  albuterol  (VENTOLIN  HFA) 108 (90 Base) MCG/ACT inhaler [id number] Yes Inhale 2 puffs into the lungs every 2 (two) hours as needed for wheezing or shortness of breath. Colin Dawley, MD Taking Active Self  atorvastatin  (LIPITOR) 20 MG tablet [id number] Yes TAKE ONE TABLET BY MOUTH ONCE DAILY. Laurann Pollock, MD Taking Active Self  benzonatate  (TESSALON ) 100 MG capsule [id number] Yes Take 1 capsule (100 mg total) by mouth 3 (three) times daily as needed for cough. Do not take with alcohol  or while operating or driving heavy machinery Wilhemena Harbour, NP Taking Active Self           Med Note (WARD, ANGELICA G   Tue May 06, 2023  4:26 PM) Pt needs a refill   bumetanide  (BUMEX ) 2 MG tablet [id number] Yes Take 1 tablet (2 mg total) by mouth daily. [name], [name], NP Taking Active   digoxin  (LANOXIN ) 0.125 MG tablet [id number] Yes Take 1 tablet (0.125 mg total) by mouth daily. [name], [name], NP Taking Active   milrinone  (PRIMACOR ) 20 MG/100 ML SOLN infusion [id number] Yes Inject 0.0318 mg/min into the vein continuous. [name], [name], NP Taking Active   potassium chloride  SA (KLOR-CON  M) 20 MEQ tablet [id number] Yes Take 2 tablets (40 mEq total) by mouth 2 (two) times daily. [name], [name], NP Taking Active   spironolactone  (ALDACTONE ) 25 MG tablet [id number] Yes Take 2 tablets (50 mg total) by mouth daily. [name], [name], NP Taking Active   warfarin (COUMADIN ) 10 MG tablet [id number] Yes Take 10 mg (1 tablet) by mouth daily Monday through Friday, then take 5 mg (1/2 tablet) by mouth on Saturday and on Sunday. [name], [name], NP Taking Active             Home Care and Equipment/Supplies: Were Home Health Services Ordered?: Yes Name of [facility] Health Agency:: [facility] Has Agency set up a time to come to your home?: Yes First Home Health Visit Date: 05/17/23 Any new equipment or medical supplies ordered?: Yes Name of Medical supply agency?: [employer] Were you able to get the equipment/medical supplies?: Yes Do you have any questions related to the use of the equipment/supplies?: No  Functional Questionnaire: Do you need assistance with bathing/showering or dressing?: Yes (mom) Do you need assistance with meal preparation?: Yes (parents) Do you need assistance with eating?: No Do you have difficulty maintaining continence: No Do you  need assistance with getting out of bed/getting out of a chair/moving?: No Do you have difficulty managing or taking your medications?: No  Follow up appointments reviewed: PCP Follow-up appointment confirmed?: Yes Date of PCP follow-up appointment?: 05/26/23 Follow-up Provider: [facility] to establish care Specialist  Hospital Follow-up appointment confirmed?: Yes Date of Specialist follow-up appointment?: 05/23/23 [facility] Do you need transportation to your follow-up appointment?: No (will call [employer] to inquire and if not will drive herself.) Do you understand care options if your condition(s) worsen?: Yes-patient verbalized understanding  SDOH Interventions Today    Flowsheet Row Most Recent Value  SDOH Interventions   Food Insecurity Interventions Intervention Not Indicated  Housing Interventions Intervention Not Indicated  Transportation Interventions Intervention Not Indicated  Utilities Interventions Intervention Not Indicated      Today's Vitals   05/20/23 1050  Weight: 243 lb 8 oz (110.5 kg)   Patient reports that she is doing well. Reports good family support. Reports [facility] is working well with her on her home IV.  Patient reports that she is managing her PICC line well and denies any questions.  Reports weight is down some.  ( 1 pound)  Reviewed the importance of following low salt diet, daily weights, heart failure zones, taking medications as prescribed. Patient is very knowledgeable about self management.  Reports her only concern is transportation.  Patient reports that she will call [employer] about her transportation benefit. States otherwise she feels comfortable driving herself. Reports that her parents drive locally only.  Encouraged patient to use her inhaler as needed for her shortness of breath.   Reviewed and offered 30 day TOC program and patient declined.  Provided my contact information and encouraged patient to call me if needed.    Orpha Blade, RN, BSN, CEN [employer]- Transition of Care Team.  [facility] [phone]

## 2023-05-20 NOTE — Patient Instructions (Signed)
 Hold warfarin tonight then decrease dose to 1 tablet daily. On continuous IV Milrinone  now.  [facility] Recheck in 1 wk Potential LVAD in future

## 2023-05-21 ENCOUNTER — Other Ambulatory Visit (HOSPITAL_COMMUNITY): Payer: Self-pay | Admitting: Cardiology

## 2023-05-21 ENCOUNTER — Other Ambulatory Visit: Payer: Self-pay | Admitting: Nurse Practitioner

## 2023-05-22 ENCOUNTER — Encounter (HOSPITAL_COMMUNITY)

## 2023-05-23 ENCOUNTER — Inpatient Hospital Stay (HOSPITAL_COMMUNITY)
Admission: RE | Admit: 2023-05-23 | Discharge: 2023-05-23 | Disposition: A | Discharge status: Home / Self Care | Source: Ambulatory Visit

## 2023-05-23 DIAGNOSIS — M5442 Lumbago with sciatica, left side: Secondary | ICD-10-CM | POA: Diagnosis not present

## 2023-05-23 DIAGNOSIS — M48061 Spinal stenosis, lumbar region without neurogenic claudication: Secondary | ICD-10-CM | POA: Diagnosis not present

## 2023-05-23 DIAGNOSIS — R1310 Dysphagia, unspecified: Secondary | ICD-10-CM | POA: Diagnosis not present

## 2023-05-23 DIAGNOSIS — I252 Old myocardial infarction: Secondary | ICD-10-CM | POA: Diagnosis not present

## 2023-05-23 DIAGNOSIS — M5410 Radiculopathy, site unspecified: Secondary | ICD-10-CM | POA: Diagnosis not present

## 2023-05-23 DIAGNOSIS — I2721 Secondary pulmonary arterial hypertension: Secondary | ICD-10-CM | POA: Diagnosis not present

## 2023-05-23 DIAGNOSIS — M5441 Lumbago with sciatica, right side: Secondary | ICD-10-CM | POA: Diagnosis not present

## 2023-05-23 DIAGNOSIS — M51369 Other intervertebral disc degeneration, lumbar region without mention of lumbar back pain or lower extremity pain: Secondary | ICD-10-CM | POA: Diagnosis not present

## 2023-05-23 DIAGNOSIS — G894 Chronic pain syndrome: Secondary | ICD-10-CM | POA: Diagnosis not present

## 2023-05-23 DIAGNOSIS — M47816 Spondylosis without myelopathy or radiculopathy, lumbar region: Secondary | ICD-10-CM | POA: Diagnosis not present

## 2023-05-23 DIAGNOSIS — I5084 End stage heart failure: Secondary | ICD-10-CM | POA: Diagnosis not present

## 2023-05-23 DIAGNOSIS — Z452 Encounter for adjustment and management of vascular access device: Secondary | ICD-10-CM | POA: Diagnosis not present

## 2023-05-23 DIAGNOSIS — I051 Rheumatic mitral insufficiency: Secondary | ICD-10-CM | POA: Diagnosis not present

## 2023-05-23 DIAGNOSIS — I110 Hypertensive heart disease with heart failure: Secondary | ICD-10-CM | POA: Diagnosis not present

## 2023-05-23 DIAGNOSIS — E876 Hypokalemia: Secondary | ICD-10-CM | POA: Diagnosis not present

## 2023-05-23 DIAGNOSIS — Z6835 Body mass index (BMI) 35.0-35.9, adult: Secondary | ICD-10-CM | POA: Diagnosis not present

## 2023-05-23 DIAGNOSIS — G4733 Obstructive sleep apnea (adult) (pediatric): Secondary | ICD-10-CM | POA: Diagnosis not present

## 2023-05-23 DIAGNOSIS — F17210 Nicotine dependence, cigarettes, uncomplicated: Secondary | ICD-10-CM | POA: Diagnosis not present

## 2023-05-23 DIAGNOSIS — I428 Other cardiomyopathies: Secondary | ICD-10-CM | POA: Diagnosis not present

## 2023-05-23 DIAGNOSIS — I5082 Biventricular heart failure: Secondary | ICD-10-CM | POA: Diagnosis not present

## 2023-05-23 DIAGNOSIS — I4729 Other ventricular tachycardia: Secondary | ICD-10-CM | POA: Diagnosis not present

## 2023-05-23 DIAGNOSIS — I5043 Acute on chronic combined systolic (congestive) and diastolic (congestive) heart failure: Secondary | ICD-10-CM | POA: Diagnosis not present

## 2023-05-23 DIAGNOSIS — I2510 Atherosclerotic heart disease of native coronary artery without angina pectoris: Secondary | ICD-10-CM | POA: Diagnosis not present

## 2023-05-23 DIAGNOSIS — D509 Iron deficiency anemia, unspecified: Secondary | ICD-10-CM | POA: Diagnosis not present

## 2023-05-24 NOTE — Telephone Encounter (Signed)
 Unable to refill per protocol, last refill by another provider.   Requested Prescriptions  Pending Prescriptions Disp Refills   benzonatate  (TESSALON ) 100 MG capsule [Pharmacy Med Name: benzonatate  100 mg capsule] 21 capsule 0    Sig: Take 1 capsule (100 mg total) by mouth 3 (three) times daily as needed for cough. Do not take with alcohol  or while operating or driving heavy machinery     There is no refill protocol information for this order

## 2023-05-26 ENCOUNTER — Ambulatory Visit: Attending: Cardiology | Admitting: *Deleted

## 2023-05-26 ENCOUNTER — Encounter: Payer: Self-pay | Admitting: Physician Assistant

## 2023-05-26 ENCOUNTER — Ambulatory Visit: Admitting: Physician Assistant

## 2023-05-26 VITALS — BP 110/68 | Ht 70.0 in | Wt 248.0 lb

## 2023-05-26 DIAGNOSIS — Z86718 Personal history of other venous thrombosis and embolism: Secondary | ICD-10-CM | POA: Diagnosis not present

## 2023-05-26 DIAGNOSIS — Z86711 Personal history of pulmonary embolism: Secondary | ICD-10-CM | POA: Diagnosis not present

## 2023-05-26 DIAGNOSIS — Z5181 Encounter for therapeutic drug level monitoring: Secondary | ICD-10-CM

## 2023-05-26 DIAGNOSIS — Z7689 Persons encountering health services in other specified circumstances: Secondary | ICD-10-CM

## 2023-05-26 DIAGNOSIS — R7303 Prediabetes: Secondary | ICD-10-CM

## 2023-05-26 DIAGNOSIS — I5020 Unspecified systolic (congestive) heart failure: Secondary | ICD-10-CM

## 2023-05-26 DIAGNOSIS — R7989 Other specified abnormal findings of blood chemistry: Secondary | ICD-10-CM

## 2023-05-26 DIAGNOSIS — Z1231 Encounter for screening mammogram for malignant neoplasm of breast: Secondary | ICD-10-CM | POA: Diagnosis not present

## 2023-05-26 DIAGNOSIS — D509 Iron deficiency anemia, unspecified: Secondary | ICD-10-CM

## 2023-05-26 LAB — POCT INR: INR: 3.5 — AB (ref 2.0–3.0)

## 2023-05-26 MED ORDER — BENZONATATE 100 MG PO CAPS: 100.0000 mg | ORAL_CAPSULE | Freq: Three times a day (TID) | ORAL | 0 refills | Status: DC | PRN

## 2023-05-26 NOTE — Assessment & Plan Note (Signed)
 Hospital discharge summary and lab work reviewed today. Lungs clear to auscultation bilaterally, lungs sounds normal, murmur noted. Patient states weight is stable. She was encouraged to keep her appointment with vascular on Wednesday and follow closely with them. Advised continued follow up with cardiology. Referral to heart failure clinic placed today. No medication adjustments today. Answered all patient questions to the best of my ability.

## 2023-05-26 NOTE — Progress Notes (Signed)
 New Patient Office Visit  Subjective    Patient ID: [name] [name], female    DOB: May 12, 1975  Age: 48 y.o. MRN: [id number]  CC:  Chief Complaint  Patient presents with   [facility] follow up- CHF    HPI [name] [name] presents to establish care  Patient presents today with past medical history significant for heart failure with reduced ejection fraction, hypertension, pulmonary hypertension, chronic pain syndrome, iron  deficiency anemia, prediabetes, and s/p IVC secondary to history of saddle PE on chronic anticoagulation. Patient with PICC line in place today on milrinone . She was recently discharged from the hospital due to cardiogenic shock, HFrEF, mitral regurgitation, AKI, and anemia. Patient receives home health on Tuesdays and Fridays. She follows closely with Dr. Junita Oliva office for INR checks. She reports INRs have been increased recently since starting on digoxin . She endorses cough, headaches, and leg cramps today. She denies worsening chest pain, shortness of breath, or weight gain.   Outpatient Encounter Medications as of 05/26/2023  Medication Sig   albuterol  (VENTOLIN  HFA) 108 (90 Base) MCG/ACT inhaler Inhale 2 puffs into the lungs every 2 (two) hours as needed for wheezing or shortness of breath.   atorvastatin  (LIPITOR) 20 MG tablet TAKE ONE TABLET BY MOUTH ONCE DAILY.   benzonatate  (TESSALON ) 100 MG capsule Take 1 capsule (100 mg total) by mouth 3 (three) times daily as needed for cough. Do not take with alcohol  or while operating or driving heavy machinery   bumetanide  (BUMEX ) 2 MG tablet Take 1 tablet (2 mg total) by mouth daily.   digoxin  (LANOXIN ) 0.125 MG tablet Take 1 tablet (0.125 mg total) by mouth daily.   milrinone  (PRIMACOR ) 20 MG/100 ML SOLN infusion Inject 0.0318 mg/min into the vein continuous.   potassium chloride  SA (KLOR-CON  M) 20 MEQ tablet Take 2 tablets (40 mEq total) by mouth 2 (two) times daily.   spironolactone  (ALDACTONE ) 25 MG  tablet Take 2 tablets (50 mg total) by mouth daily.   warfarin (COUMADIN ) 10 MG tablet Take 10 mg (1 tablet) by mouth daily Monday through Friday, then take 5 mg (1/2 tablet) by mouth on Saturday and on Sunday.   [DISCONTINUED] benzonatate  (TESSALON ) 100 MG capsule Take 1 capsule (100 mg total) by mouth 3 (three) times daily as needed for cough. Do not take with alcohol  or while operating or driving heavy machinery   No facility-administered encounter medications on file as of 05/26/2023.    Past Medical History:  Diagnosis Date   Anemia    Blood transfusion without reported diagnosis    CAD (coronary artery disease)    Nonobstructive   Closed left ankle fracture    August 30 2012   Clotting disorder [facility])    DVT (deep venous thrombosis) (HCC)    L leg   Fibroid tumor    HFrEF (heart failure with reduced ejection fraction) (HCC)    Hidradenitis    Hypertension    Mitral regurgitation    Myocardial infarction [facility])    NICM (nonischemic cardiomyopathy) (HCC)    NSVT (nonsustained ventricular tachycardia) (HCC)    Obesity    OSA on CPAP    Pulmonary embolism (HCC) 04/2013   Sleep apnea     Past Surgical History:  Procedure Laterality Date   ABSCESS DRAINAGE Bilateral 01/10/2015   BREAST BIOPSY Right 02/01/2020   benign   COLONOSCOPY WITH PROPOFOL  N/A 06/08/2018   Procedure: COLONOSCOPY WITH PROPOFOL ;  Surgeon: Suzette Espy, MD;  Location[facility];  Service: Endoscopy;  Laterality: N/A;  2:30pm   CYST REMOVAL TRUNK     IR FLUORO GUIDE CV LINE RIGHT  05/16/2023   IR US  GUIDE VASC ACCESS RIGHT  05/16/2023   IVC FILTER PLACEMENT ([facility] HX)     LOWER EXTREMITY VENOGRAPHY N/A 02/07/2021   Procedure: LOWER EXTREMITY VENOGRAPHY;  Surgeon: Kayla Part, MD;  Location: [facility] INVASIVE CV LAB;  Service: Cardiovascular;  Laterality: N/A;   PERIPHERAL VASCULAR THROMBECTOMY N/A 02/07/2021   Procedure: PERIPHERAL VASCULAR THROMBECTOMY;  Surgeon: Kayla Part, MD;  Location: [facility]  INVASIVE CV LAB;  Service: Cardiovascular;  Laterality: N/A;   RIGHT HEART CATH N/A 03/08/2022   Procedure: RIGHT HEART CATH;  Surgeon: Alwin Baars, DO;  Location: [facility] INVASIVE CV LAB;  Service: Cardiovascular;  Laterality: N/A;   RIGHT HEART CATH N/A 10/04/2022   Procedure: RIGHT HEART CATH;  Surgeon: Alwin Baars, DO;  Location: [facility] INVASIVE CV LAB;  Service: Cardiovascular;  Laterality: N/A;   RIGHT HEART CATH N/A 05/13/2023   Procedure: RIGHT HEART CATH;  Surgeon: [name], MD;  Location: [facility] INVASIVE CV LAB;  Service: Cardiovascular;  Laterality: N/A;   RIGHT/LEFT HEART CATH AND CORONARY ANGIOGRAPHY N/A 06/19/2021   Procedure: RIGHT/LEFT HEART CATH AND CORONARY ANGIOGRAPHY;  Surgeon: Arty Binning, MD;  Location: [facility] INVASIVE CV LAB;  Service: Cardiovascular;  Laterality: N/A;   TEE WITHOUT CARDIOVERSION N/A 03/08/2022   Procedure: TRANSESOPHAGEAL ECHOCARDIOGRAM (TEE);  Surgeon: Alwin Baars, DO;  Location: [facility] ENDOSCOPY;  Service: Cardiovascular;  Laterality: N/A;    Family History  Problem Relation Age of Onset   Hypertension Mother    Diabetes Paternal Aunt    Diabetes Paternal Uncle    Other Father        blood clots   Cancer Maternal Grandmother    Heart attack Paternal Grandmother    Colon cancer Neg Hx     Social History   Socioeconomic History   Marital status: Widowed    Spouse name: Not on file   Number of children: Not on file   Years of education: Not on file   Highest education level: Some college, no degree  Occupational History   Not on file  Tobacco Use   Smoking status: Every Day    Types: Cigarettes    Passive exposure: Never   Smokeless tobacco: Never   Tobacco comments:    Smokes 2-3 cigarettes daily as of 02/13/22  Vaping Use   Vaping status: Never Used  Substance and Sexual Activity   Alcohol  use: Yes    Alcohol /week: 2.0 standard drinks of alcohol     Types: 2 Cans of beer per week    Comment: twice a week   Drug use: No    Sexual activity: Not Currently    Birth control/protection: Pill, None  Other Topics Concern   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    Has a 47 year old Aviauna.   Live with parents.   Was working 5 days a weeks.   Not working right now.    Attends church.    Social Drivers of Corporate investment banker Strain: Low Risk  (04/03/2023)   Overall Financial Resource Strain (CARDIA)    Difficulty of Paying Living Expenses: Not very hard  Food Insecurity: No Food Insecurity (05/20/2023)   Hunger Vital Sign    Worried About Running Out of  Food in the Last Year: Never true    Ran Out of Food in the Last Year: Never true  Transportation Needs: No Transportation Needs (05/20/2023)   PRAPARE - Administrator, Civil Service (Medical): No    Lack of Transportation (Non-Medical): No  Physical Activity: Insufficiently Active (04/03/2023)   Exercise Vital Sign    Days of Exercise per Week: 2 days    Minutes of Exercise per Session: 10 min  Stress: No Stress Concern Present (04/03/2023)   [employer] of Occupational Health - Occupational Stress Questionnaire    Feeling of Stress : Only a little  Social Connections: Socially Isolated (04/03/2023)   Social Connection and Isolation Panel [NHANES]    Frequency of Communication with Friends and Family: Twice a week    Frequency of Social Gatherings with Friends and Family: Twice a week    Attends Religious Services: Never    Database administrator or Organizations: No    Attends Banker Meetings: Never    Marital Status: Widowed  Intimate Partner Violence: Not At Risk (05/20/2023)   Humiliation, Afraid, Rape, and Kick questionnaire    Fear of Current or Ex-Partner: No    Emotionally Abused: No    Physically Abused: No    Sexually Abused: No    Review of Systems  Constitutional:  Positive for malaise/fatigue. Negative for fever and weight loss.   Respiratory:  Positive for cough. Negative for shortness of breath.   Cardiovascular:  Negative for chest pain, palpitations and leg swelling.  Musculoskeletal:  Positive for back pain and joint pain. Negative for myalgias.  Neurological:  Positive for headaches. Negative for dizziness and sensory change.        Objective    BP 110/68   Ht 5\' 10"  (1.778 m)   Wt 248 lb (112.5 kg)   LMP 03/25/2023   BMI 35.58 kg/m   Physical Exam Constitutional:      Appearance: Normal appearance.  HENT:     Head: Normocephalic.     Mouth/Throat:     Mouth: Mucous membranes are moist.     Pharynx: Oropharynx is clear.  Eyes:     Extraocular Movements: Extraocular movements intact.     Conjunctiva/sclera: Conjunctivae normal.  Cardiovascular:     Rate and Rhythm: Normal rate and regular rhythm.     Heart sounds: Murmur heard.     No gallop.  Pulmonary:     Effort: Pulmonary effort is normal.     Breath sounds: No wheezing, rhonchi or rales.  Musculoskeletal:     Right lower leg: No edema.     Left lower leg: No edema.  Skin:    General: Skin is warm and dry.  Neurological:     General: No focal deficit present.     Mental Status: She is alert and oriented to person, place, and time.  Psychiatric:        Mood and Affect: Mood normal.        Behavior: Behavior normal.       Assessment & Plan:  Encounter to establish care  HFrEF (heart failure with reduced ejection fraction) [facility]) Assessment & Plan: Hospital discharge summary and lab work reviewed today. Lungs clear to auscultation bilaterally, lungs sounds normal, murmur noted. Patient states weight is stable. She was encouraged to keep her appointment with vascular on Wednesday and follow closely with them. Advised continued follow up with cardiology. Referral to heart failure clinic placed today. No medication adjustments  today. Answered all patient questions to the best of my ability.   Orders: -     AMB referral to CHF  clinic  Iron  deficiency anemia, unspecified iron  deficiency anemia type Assessment & Plan: Stable today. Patient denies worsening shortness of breath, dizziness, or syncope, however does endorse fatigue. CBC today. Referral to hematology for further management of her iron  deficiency anemia.   Orders: -     CBC with Differential/Platelet -     Ambulatory referral to Hematology / Oncology  Prediabetes Assessment & Plan: Discussed A1c results with patient. Discussed prediabetes and monitoring needed. Advised dietary changed to include decreased sugary beverages, sweets, desserts, and simple carbs. No medication needed at this time. Will follow up with A1c in 3 months.    Elevated serum creatinine -     CMP14+EGFR  Encounter for screening mammogram for malignant neoplasm of breast -     3D Screening Mammogram, Left and Right  Other orders -     Benzonatate ; Take 1 capsule (100 mg total) by mouth 3 (three) times daily as needed for cough. Do not take with alcohol  or while operating or driving heavy machinery  Dispense: 21 capsule; Refill: 0    Return in about 4 weeks (around 06/23/2023) for heart failure .   Jearlean Mince Daley Mooradian, PA-C

## 2023-05-26 NOTE — Patient Instructions (Signed)
 Hold warfarin tonight then decrease dose to 1 tablet daily except 1/2 tablet on Sundays and Wednesdays On continuous IV Milrinone  now.  [facility] Recheck in 1 wk Potential LVAD in future

## 2023-05-26 NOTE — Assessment & Plan Note (Signed)
 Stable today. Patient denies worsening shortness of breath, dizziness, or syncope, however does endorse fatigue. CBC today. Referral to hematology for further management of her iron  deficiency anemia.

## 2023-05-26 NOTE — Assessment & Plan Note (Signed)
 Discussed A1c results with patient. Discussed prediabetes and monitoring needed. Advised dietary changed to include decreased sugary beverages, sweets, desserts, and simple carbs. No medication needed at this time. Will follow up with A1c in 3 months.

## 2023-05-27 ENCOUNTER — Telehealth (HOSPITAL_COMMUNITY): Payer: Self-pay

## 2023-05-27 DIAGNOSIS — I5084 End stage heart failure: Secondary | ICD-10-CM | POA: Diagnosis not present

## 2023-05-27 DIAGNOSIS — M5410 Radiculopathy, site unspecified: Secondary | ICD-10-CM | POA: Diagnosis not present

## 2023-05-27 DIAGNOSIS — I252 Old myocardial infarction: Secondary | ICD-10-CM | POA: Diagnosis not present

## 2023-05-27 DIAGNOSIS — Z6835 Body mass index (BMI) 35.0-35.9, adult: Secondary | ICD-10-CM | POA: Diagnosis not present

## 2023-05-27 DIAGNOSIS — F17210 Nicotine dependence, cigarettes, uncomplicated: Secondary | ICD-10-CM | POA: Diagnosis not present

## 2023-05-27 DIAGNOSIS — I428 Other cardiomyopathies: Secondary | ICD-10-CM | POA: Diagnosis not present

## 2023-05-27 DIAGNOSIS — M5441 Lumbago with sciatica, right side: Secondary | ICD-10-CM | POA: Diagnosis not present

## 2023-05-27 DIAGNOSIS — I5082 Biventricular heart failure: Secondary | ICD-10-CM | POA: Diagnosis not present

## 2023-05-27 DIAGNOSIS — D509 Iron deficiency anemia, unspecified: Secondary | ICD-10-CM | POA: Diagnosis not present

## 2023-05-27 DIAGNOSIS — I2510 Atherosclerotic heart disease of native coronary artery without angina pectoris: Secondary | ICD-10-CM | POA: Diagnosis not present

## 2023-05-27 DIAGNOSIS — I110 Hypertensive heart disease with heart failure: Secondary | ICD-10-CM | POA: Diagnosis not present

## 2023-05-27 DIAGNOSIS — G4733 Obstructive sleep apnea (adult) (pediatric): Secondary | ICD-10-CM | POA: Diagnosis not present

## 2023-05-27 DIAGNOSIS — I2721 Secondary pulmonary arterial hypertension: Secondary | ICD-10-CM | POA: Diagnosis not present

## 2023-05-27 DIAGNOSIS — I051 Rheumatic mitral insufficiency: Secondary | ICD-10-CM | POA: Diagnosis not present

## 2023-05-27 DIAGNOSIS — G894 Chronic pain syndrome: Secondary | ICD-10-CM | POA: Diagnosis not present

## 2023-05-27 DIAGNOSIS — M47816 Spondylosis without myelopathy or radiculopathy, lumbar region: Secondary | ICD-10-CM | POA: Diagnosis not present

## 2023-05-27 DIAGNOSIS — Z452 Encounter for adjustment and management of vascular access device: Secondary | ICD-10-CM | POA: Diagnosis not present

## 2023-05-27 DIAGNOSIS — M5442 Lumbago with sciatica, left side: Secondary | ICD-10-CM | POA: Diagnosis not present

## 2023-05-27 DIAGNOSIS — E876 Hypokalemia: Secondary | ICD-10-CM | POA: Diagnosis not present

## 2023-05-27 DIAGNOSIS — M48061 Spinal stenosis, lumbar region without neurogenic claudication: Secondary | ICD-10-CM | POA: Diagnosis not present

## 2023-05-27 DIAGNOSIS — R1310 Dysphagia, unspecified: Secondary | ICD-10-CM | POA: Diagnosis not present

## 2023-05-27 DIAGNOSIS — I4729 Other ventricular tachycardia: Secondary | ICD-10-CM | POA: Diagnosis not present

## 2023-05-27 DIAGNOSIS — M51369 Other intervertebral disc degeneration, lumbar region without mention of lumbar back pain or lower extremity pain: Secondary | ICD-10-CM | POA: Diagnosis not present

## 2023-05-27 DIAGNOSIS — I5043 Acute on chronic combined systolic (congestive) and diastolic (congestive) heart failure: Secondary | ICD-10-CM | POA: Diagnosis not present

## 2023-05-27 NOTE — Telephone Encounter (Signed)
 Called to confirm/remind patient of their appointment at [facility] on 05/28/2023 11:30.   Appointment:   [x] Confirmed  [] Left mess   [] No answer/No voice mail  [] VM Full/unable to leave message  [] Phone not in service  Patient reminded to bring all medications and/or complete list.  Confirmed patient has transportation. Gave directions, instructed to utilize valet parking.

## 2023-05-28 ENCOUNTER — Encounter (HOSPITAL_COMMUNITY): Payer: Self-pay

## 2023-05-28 ENCOUNTER — Other Ambulatory Visit: Payer: Self-pay

## 2023-05-28 ENCOUNTER — Ambulatory Visit (HOSPITAL_COMMUNITY)
Admission: RE | Admit: 2023-05-28 | Discharge: 2023-05-28 | Disposition: A | Discharge status: Home / Self Care | Source: Ambulatory Visit | Attending: Physician Assistant | Admitting: Physician Assistant

## 2023-05-28 ENCOUNTER — Other Ambulatory Visit (HOSPITAL_COMMUNITY): Payer: Self-pay

## 2023-05-28 VITALS — BP 109/89 | HR 82 | Ht 70.0 in | Wt 248.6 lb

## 2023-05-28 DIAGNOSIS — I5042 Chronic combined systolic (congestive) and diastolic (congestive) heart failure: Secondary | ICD-10-CM

## 2023-05-28 DIAGNOSIS — D509 Iron deficiency anemia, unspecified: Secondary | ICD-10-CM | POA: Diagnosis not present

## 2023-05-28 DIAGNOSIS — Z7901 Long term (current) use of anticoagulants: Secondary | ICD-10-CM | POA: Diagnosis not present

## 2023-05-28 DIAGNOSIS — Z720 Tobacco use: Secondary | ICD-10-CM | POA: Diagnosis not present

## 2023-05-28 DIAGNOSIS — F17210 Nicotine dependence, cigarettes, uncomplicated: Secondary | ICD-10-CM | POA: Diagnosis not present

## 2023-05-28 DIAGNOSIS — I2729 Other secondary pulmonary hypertension: Secondary | ICD-10-CM | POA: Diagnosis not present

## 2023-05-28 DIAGNOSIS — I340 Nonrheumatic mitral (valve) insufficiency: Secondary | ICD-10-CM | POA: Diagnosis not present

## 2023-05-28 DIAGNOSIS — I2510 Atherosclerotic heart disease of native coronary artery without angina pectoris: Secondary | ICD-10-CM | POA: Diagnosis not present

## 2023-05-28 DIAGNOSIS — D61818 Other pancytopenia: Secondary | ICD-10-CM | POA: Diagnosis not present

## 2023-05-28 DIAGNOSIS — I110 Hypertensive heart disease with heart failure: Secondary | ICD-10-CM | POA: Diagnosis not present

## 2023-05-28 DIAGNOSIS — Z86718 Personal history of other venous thrombosis and embolism: Secondary | ICD-10-CM | POA: Diagnosis not present

## 2023-05-28 DIAGNOSIS — I428 Other cardiomyopathies: Secondary | ICD-10-CM | POA: Diagnosis not present

## 2023-05-28 DIAGNOSIS — I5082 Biventricular heart failure: Secondary | ICD-10-CM | POA: Diagnosis not present

## 2023-05-28 DIAGNOSIS — Z79899 Other long term (current) drug therapy: Secondary | ICD-10-CM | POA: Diagnosis not present

## 2023-05-28 DIAGNOSIS — I5022 Chronic systolic (congestive) heart failure: Secondary | ICD-10-CM | POA: Insufficient documentation

## 2023-05-28 DIAGNOSIS — Z6835 Body mass index (BMI) 35.0-35.9, adult: Secondary | ICD-10-CM | POA: Diagnosis not present

## 2023-05-28 DIAGNOSIS — Z86711 Personal history of pulmonary embolism: Secondary | ICD-10-CM | POA: Diagnosis not present

## 2023-05-28 DIAGNOSIS — E66812 Obesity, class 2: Secondary | ICD-10-CM

## 2023-05-28 DIAGNOSIS — D500 Iron deficiency anemia secondary to blood loss (chronic): Secondary | ICD-10-CM

## 2023-05-28 LAB — CBC WITH DIFFERENTIAL/PLATELET
Abs Immature Granulocytes: 0.01 10*3/uL (ref 0.00–0.07)
Basophils Absolute: 0.1 10*3/uL (ref 0.0–0.1)
Basophils Relative: 1 %
Eosinophils Absolute: 0.2 10*3/uL (ref 0.0–0.5)
Eosinophils Relative: 5 %
HCT: 38 % (ref 36.0–46.0)
Hemoglobin: 10.8 g/dL — ABNORMAL LOW (ref 12.0–15.0)
Immature Granulocytes: 0 %
Lymphocytes Relative: 16 %
Lymphs Abs: 0.7 10*3/uL (ref 0.7–4.0)
MCH: 23.3 pg — ABNORMAL LOW (ref 26.0–34.0)
MCHC: 28.4 g/dL — ABNORMAL LOW (ref 30.0–36.0)
MCV: 81.9 fL (ref 80.0–100.0)
Monocytes Absolute: 0.4 10*3/uL (ref 0.1–1.0)
Monocytes Relative: 8 %
Neutro Abs: 3.2 10*3/uL (ref 1.7–7.7)
Neutrophils Relative %: 70 %
Platelets: 258 10*3/uL (ref 150–400)
RBC: 4.64 MIL/uL (ref 3.87–5.11)
RDW: 29.2 % — ABNORMAL HIGH (ref 11.5–15.5)
WBC: 4.6 10*3/uL (ref 4.0–10.5)
nRBC: 0 % (ref 0.0–0.2)

## 2023-05-28 LAB — COMPREHENSIVE METABOLIC PANEL WITH GFR
ALT: 25 U/L (ref 0–44)
AST: 34 U/L (ref 15–41)
Albumin: 3.3 g/dL — ABNORMAL LOW (ref 3.5–5.0)
Alkaline Phosphatase: 112 U/L (ref 38–126)
Anion gap: 10 (ref 5–15)
BUN: 14 mg/dL (ref 6–20)
CO2: 30 mmol/L (ref 22–32)
Calcium: 9.7 mg/dL (ref 8.9–10.3)
Chloride: 98 mmol/L (ref 98–111)
Creatinine, Ser: 0.99 mg/dL (ref 0.44–1.00)
GFR, Estimated: >60 mL/min (ref >60)
Glucose, Bld: 90 mg/dL (ref 70–99)
Potassium: 5.1 mmol/L (ref 3.5–5.1)
Sodium: 138 mmol/L (ref 135–145)
Total Bilirubin: 1.6 mg/dL — ABNORMAL HIGH (ref 0.0–1.2)
Total Protein: 9 g/dL — ABNORMAL HIGH (ref 6.5–8.1)

## 2023-05-28 LAB — DIGOXIN LEVEL: Digoxin Level: 0.5 ng/mL — ABNORMAL LOW (ref 0.8–2.0)

## 2023-05-28 LAB — BRAIN NATRIURETIC PEPTIDE: B Natriuretic Peptide: 1155.3 pg/mL — ABNORMAL HIGH (ref 0.0–100.0)

## 2023-05-28 NOTE — Progress Notes (Signed)
 Orders placed for [facility] and TEE scheduled on 5/12 with Dr. Bensimhon.

## 2023-05-28 NOTE — H&P (View-Only) (Signed)
 ADVANCED HEART FAILURE CLINIC NOTE  Primary Care: [facility], [place], [place] Primary Cardiologist: Armida Lander, MD HF Cardiologist: Dr. Bruce Caper EP: Marlane Silver, MD   HPI: [name] [name] is a 48 y.o. female with hx of saddle PE/DVT s/p IVC filter (2015), HFrEF/NICM, pancytopenia, nonobstructive CAD, morbid obesity, noncompliance, pancytopenia (followed by hematology), tobacco use.  In 5/23 she was admitted with acute hypoxic respiratory failure requiring intubation. LHC during that admit w/ nonobstructive CAD. She was admitted again in 9/23 with syncope, nausea and C2 fracture managed conservatively. LVEF during that admit noted to be 35% w/ severe posterior MR.   Admitted with acute on chronic CHF in 06/24. EF was down to 25-30%.   Had been considered for mTEER and possible LVAD but was lost to follow-up until recent readmission.  Admitted to [facility] ICU in 04/25 with cardiogenic shock. Transferred to [facility] with worsening hypotension and increased pressor requirements.  Echo 4/25 EF 20-25% severe RV dysfunction with septal flattening mod-severe MR. Required inotrope/pressor support in addition to lasix  gtt, diamox  and metolazone  to fully diurese. Initially on DBA but later transitioned to milrinone  given severely elevated PA pressures on RHC. She was discharged with home inotrope with plan to workup for advanced therapies as an ouptatient if compliance improved. Discharge weight 244 lbs.  Here today for hospital follow-up. Accompanied by her mother who assists with the history. Has been feeling okay. Able to get dressed and prepare food but has little energy for any additional activity. Unable to walk to her mailbox d/t dyspnea and fatigue. Orthopnea and lower extremity edema improved. Home weight has been stable between 244-248 lb. Taking all medications as prescribed.  Smoking 3 cigarettes a day.  Has medicare/medicaid. Able to get transportation to today's visit through insurance  transportation services. Has good family support. No food or housing insecurity.    Past Medical History:  Diagnosis Date   Anemia    Blood transfusion without reported diagnosis    CAD (coronary artery disease)    Nonobstructive   Closed left ankle fracture    August 30 2012   Clotting disorder [facility])    DVT (deep venous thrombosis) (HCC)    L leg   Fibroid tumor    HFrEF (heart failure with reduced ejection fraction) (HCC)    Hidradenitis    Hypertension    Mitral regurgitation    Myocardial infarction (HCC)    NICM (nonischemic cardiomyopathy) (HCC)    NSVT (nonsustained ventricular tachycardia) (HCC)    Obesity    OSA on CPAP    Pulmonary embolism (HCC) 04/2013   Sleep apnea     Current Outpatient Medications  Medication Sig Dispense Refill   atorvastatin  (LIPITOR) 20 MG tablet TAKE ONE TABLET BY MOUTH ONCE DAILY. 90 tablet 3   benzonatate  (TESSALON ) 100 MG capsule Take 1 capsule (100 mg total) by mouth 3 (three) times daily as needed for cough. Do not take with alcohol  or while operating or driving heavy machinery 21 capsule 0   bumetanide  (BUMEX ) 2 MG tablet Take 1 tablet (2 mg total) by mouth daily. 90 tablet 4   digoxin  (LANOXIN ) 0.125 MG tablet Take 1 tablet (0.125 mg total) by mouth daily. 90 tablet 4   milrinone  (PRIMACOR ) 20 MG/100 ML SOLN infusion Inject 0.0318 mg/min into the vein continuous.     potassium chloride  SA (KLOR-CON  M) 20 MEQ tablet Take 2 tablets (40 mEq total) by mouth 2 (two) times daily. 120 tablet 6   spironolactone  (ALDACTONE ) 25 MG  tablet Take 2 tablets (50 mg total) by mouth daily. 180 tablet 4   warfarin (COUMADIN ) 10 MG tablet Take 10 mg (1 tablet) by mouth daily Monday through Friday, then take 5 mg (1/2 tablet) by mouth on Saturday and on Sunday. 90 tablet 4   albuterol  (VENTOLIN  HFA) 108 (90 Base) MCG/ACT inhaler Inhale 2 puffs into the lungs every 2 (two) hours as needed for wheezing or shortness of breath. 18 g 11   No current  facility-administered medications for this encounter.    Allergies  Allergen Reactions   Sulfa  Antibiotics Itching, Swelling and Rash    Lip swelling      Social History   Socioeconomic History   Marital status: Widowed    Spouse name: Not on file   Number of children: Not on file   Years of education: Not on file   Highest education level: Some college, no degree  Occupational History   Not on file  Tobacco Use   Smoking status: Every Day    Types: Cigarettes    Passive exposure: Never   Smokeless tobacco: Never   Tobacco comments:    Smokes 2-3 cigarettes daily as of 02/13/22  Vaping Use   Vaping status: Never Used  Substance and Sexual Activity   Alcohol  use: Yes    Alcohol /week: 2.0 standard drinks of alcohol     Types: 2 Cans of beer per week    Comment: twice a week   Drug use: No   Sexual activity: Not Currently    Birth control/protection: Pill, None  Other Topics Concern   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    Has a 47 year old Aviauna.   Live with parents.   Was working 5 days a weeks.   Not working right now.    Attends church.    Social Drivers of Corporate investment banker Strain: Low Risk  (04/03/2023)   Overall Financial Resource Strain (CARDIA)    Difficulty of Paying Living Expenses: Not very hard  Food Insecurity: No Food Insecurity (05/20/2023)   Hunger Vital Sign    Worried About Running Out of Food in the Last Year: Never true    Ran Out of Food in the Last Year: Never true  Transportation Needs: No Transportation Needs (05/20/2023)   PRAPARE - Administrator, Civil Service (Medical): No    Lack of Transportation (Non-Medical): No  Physical Activity: Insufficiently Active (04/03/2023)   Exercise Vital Sign    Days of Exercise per Week: 2 days    Minutes of Exercise per Session: 10 min  Stress: No Stress Concern Present (04/03/2023)   [employer] of  Occupational Health - Occupational Stress Questionnaire    Feeling of Stress : Only a little  Social Connections: Socially Isolated (04/03/2023)   Social Connection and Isolation Panel [NHANES]    Frequency of Communication with Friends and Family: Twice a week    Frequency of Social Gatherings with Friends and Family: Twice a week    Attends Religious Services: Never    Database administrator or Organizations: No    Attends Banker Meetings: Never    Marital Status: Widowed  Intimate Partner Violence: Not At Risk (05/20/2023)   Humiliation, Afraid, Rape, and Kick questionnaire    Fear of Current or Ex-Partner: No    Emotionally Abused: No    Physically Abused: No  Sexually Abused: No      Family History  Problem Relation Age of Onset   Hypertension Mother    Diabetes Paternal Aunt    Diabetes Paternal Uncle    Other Father        blood clots   Cancer Maternal Grandmother    Heart attack Paternal Grandmother    Colon cancer Neg Hx     PHYSICAL EXAM: Vitals:   05/28/23 1110  BP: 109/89  Pulse: 82  SpO2: 98%   General:  Appears fatigued, chronically ill Neck: + JVD with prominent v waves Cor: Regular rate & rhythm. No rubs, gallops or murmurs. Tunneled internal jugular central line R chest.  Lungs: clear Abdomen: obese, soft, nontender, nondistended. Extremities: trace edema Neuro: alert & orientedx3. Affect pleasant  Wt Readings from Last 3 Encounters:  05/28/23 112.8 kg (248 lb 9.6 oz)  05/26/23 112.5 kg (248 lb)  05/20/23 110.5 kg (243 lb 8 oz)    DATA REVIEW  ECG: SR 91 bpm, PVC  ECHO: 09/24/21: LVEF 30-35%, RV function mildly reduced, severe posterior MR 8/23: LVEF 30-35%, LVID 7.2cm w/ grade III DD, moderate to severe MR. Severely dilated LA.  09/03/17: LVEF 60%-65%.  TEE 2/24: severe eccentric MR, moderately reduced LV function.  07/03/22: LVEF 25-30% 05/07/23: LVEF 25-30%, grade III DD, RV moderately reduced, moderate posterior MR 05/16/23:  LVEF 25-30%, RV moderately reduced, moderate to severe MR   CATH: 06/19/21: 40 to 50% proximal right coronary narrowing. Right dominant anatomy Coronary arteries otherwise normal Significant elevation LVEDP 30 mmHg consistent with acute on chronic combined systolic and diastolic heart failure Mild pulmonary hypertension, mean pressure 30 mmHg.  WHO group II etiology based on hemodysnamics.. Capillary wedge mean 24 mmHg.  V wave to 40 mmHg suggesting some degree of mitral regurgitation. Cardiac output 8.5 L/min with index 3.57 L/min/m. Pulmonary resistance 0.7 Wood units.  RHC on 7.5 DBA 05/13/23: HEMODYNAMICS: RA:       19 mmHg (mean) RV:       90/9, 19 mmHg PA:       90/38 mmHg (55 mean) PCWP: 27 mmHg (mean) with v waves to 45                                      Estimated Fick CO/CI   6.6L/min, 2.74L/min/m2         Thermodilution CO/CI   6.4L/min, 2.66L/min/m2                                      TPG  28  mmHg                                               PVR  4.375 Wood Units  PAPi  2.73       IMPRESSION: Right heart catheterization for end stage cardiomyopathy on dobutamine  7.5. Severely elevated biventricular filling pressures. Normal cardiac output by thermodilution on inotropic support. 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   ASSESSMENT & PLAN: Chronic systolic HF with biventricular dysfunction/stage D cardiomyopathy - LHC in 2023 with nonobstructive CAD.  - RHC 4/25 (on 7.5 DBA): Severely elevated biventricular filling pressures, severe post  capillary PH, normal CO by TD. - Echo 4/25 EF 25-30% moderately reduced RV with mod/sev MR  - NYHA IV. Volume okay. Continue 2 mg bumex  daily. - Continue home Milrinone  0.25 mcg/kg/min. Will need CBC with diff and CMET every 2 weeks.  - Patient seen with Dr. Marlowe Lawes. Suspect LV function slightly better than reported on echo. However, she has significant RV  dysfunction and probably severe MR. Ideally would consider transplant but not a candidate with tobacco use. Likely not an ideal VAD candidate with RV failure. Best option at this point may be mitraclip with eye towards eventual transplant. Will arrange for urgent R/LHC and TEE next week.  - Continue digoxin  0.125 mg daily - Continue spiro 50 mg daily - CMET, BNP, digoxin  level today   2. Severe MR - Echo 4/25 with moderate to severe MR (suspect may be severe) - TEE and RHC next week. Pending results may refer to structural for mTEER.   3. Hx of PE/DVT w/ IVC filter - Submassive saddle PE extending from the distal main pulmonary artery and to the left and right pulmonary arteries with extension into the left upper lobe, left lower lobe and lingula branches and right upper lobe, right middle lobe and right lower lobes on 04/27/2013  - Evaluated at [facility] where thrombophilia work-up was negative (Lupus inhibitor, anticardiolipin antibodies, and anti beta-2  glycoprotein antibodies were negative; protein C activity 85%, protein S activity 76%, factor V Leiden, and prothrombin gene mutation ) - IVC filter in place; could not be removed due to significant thrombus burden on filter at [facility].  - Previously advised against DOACs due to severity of thrombus burden and weight >250lbs.  - Continue Warfarin   4. Obesity  - Body mass index is 35.67 kg/m.  - BMI significantly down with diuresis - consider GLP1-RA at future follow-up   5. CAD - LHC in 5/23 w/ nonobstructive CAD involving RCA - Inferior WMA on most recent echo - Planning LHC next week as part of workup for possible mTEER - No aspirin  with need for anticoagulation. Continue statin.   6. Microcytic anemia - Recieved IV iron  during recent admit  7. Tobacco use - Smoking 3 cigarettes/day - Discussed need for cessation in order to qualify for transplant in the future  Follow-up 3-4 weeks with Dr. Bruce Caper, sooner if needed pending  workup  [facility], [facility] PA-C 05/28/23   Patient seen and examined with the above-signed Advanced Practice Provider and/or Housestaff. I personally reviewed laboratory data, imaging studies and relevant notes. I independently examined the patient and formulated the important aspects of the plan. I have edited the note to reflect any of my changes or salient points. I have personally discussed the plan with the patient and/or family.  48 y/o woman as above with severe biventricular HF, CTEPH. Obesity, tobacco use and severe MR  Recently admitted with massive volume overload and low output. Required inotropic support and high-volume diuresis. Discharged on milrinone  with plan for possible advanced therapies  Returns to clinic today. Still struggling with advanced HF symptoms. NYHA IV  General: Obese woman No resp difficulty HEENT: normal Neck: supple. JVP to jaw with prominent v waves Carotids 2+ bilat; no bruits. No lymphadenopathy or thryomegaly appreciated. Cor: PMI nondisplaced. Regular rate & rhythm. 2/6 MT/TR Lungs: clear Abdomen: soft, nontender, nondistended. No hepatosplenomegaly. No bruits or masses. Good bowel sounds. Extremities: no cyanosis, clubbing, rash, tredema Neuro: alert & orientedx3, cranial nerves grossly intact. moves all 4 extremities w/o difficulty. Affect  pleasant   I reviewed all her studies. Ideally, best option would be transplant but not candidate with weight and tobacco use. Not VAD candidate due to severe RV dysfunction. I think only option may be mTEER. I d/w Structural team and iVC filter would not be absolute barrier. Will plan R/L cath and TEE early next week to see if she is candidate. If not, we are likely headed down Palliative pathway. D/w Dr. Bruce Caper.   I spent a total of 55 minutes today: 1) reviewing the patient's medical records including previous charts, labs and recent notes from other providers; 2) examining the patient and counseling them on  their medical issues/explaining the plan of care; 3) adjusting meds as needed and 4) ordering lab work or other needed tests.   Jules Oar, MD  1:32 PM

## 2023-05-28 NOTE — Patient Instructions (Signed)
 Medication Changes:  No Changes In Medications at this time.   Lab Work:  Labs done today, your results will be available in MyChart, we will contact you for abnormal readings.  Testing/Procedures:  Your physician has requested that you have a TEE. During a TEE, sound waves are used to create images of your heart. It provides your doctor with information about the size and shape of your heart and how well your heart's chambers and valves are working. In this test, a transducer is attached to the end of a flexible tube that's guided down your throat and into your esophagus (the tube leading from you mouth to your stomach) to get a more detailed image of your heart. You are not awake for the procedure. Please see the instruction sheet given to you today. For further information please visit [email or web address]  PLEASE FOLLOW HEART CATHETERIZATION INSTRUCTIONS AS FOLLOWS   Special Instructions // Education:   [name] [name] [address] [place] [address] Dept: [phone] Loc: [phone]  [name] [name]  05/28/2023  You are scheduled for a Cardiac Catheterization AND TEE on Monday, May 12 with Dr. Daniel Bensimhon.  1. Please arrive at the [facility] (Main Entrance A) at [facility]: [address] [name], [place] [address] at 11:00 AM (This time is 1 hour(s) before your procedure to ensure your preparation).   Free valet parking service is available. You will check in at ADMITTING. The support person will be asked to wait in the waiting room.  It is OK to have someone drop you off and come back when you are ready to be discharged.    Special note: Every effort is made to have your procedure done on time. Please understand that emergencies sometimes delay scheduled procedures.  2. Diet: Do not eat solid foods after midnight.  The patient may have clear liquids until 5am upon the day of the  procedure.  3. Labs: You will need to have blood drawn on TODAY  4. Medication instructions in preparation for your procedure:   Contrast Allergy: No  ON THE MORNING OF PROCEDURE DO NOT TAKE SPIRONOLACTONE  OR BUMEX  THE MORNING OF PROCEDURE   5. Plan to go home the same day, you will only stay overnight if medically necessary. 6. Bring a current list of your medications and current insurance cards. 7. You MUST have a responsible person to drive you home. 8. Someone MUST be with you the first 24 hours after you arrive home or your discharge will be delayed. 9. Please wear clothes that are easy to get on and off and wear slip-on shoes.  Thank you for allowing us  to care for you!   -- [facility]  Follow-Up in: 4 WEEKS WITH DR. Bruce Caper AS SCHEDULED   At the [facility], you and your health needs are our priority. We have a designated team specialized in the treatment of Heart Failure. This Care Team includes your primary Heart Failure Specialized Cardiologist (physician), Advanced Practice Providers (APPs- Physician Assistants and Nurse Practitioners), and Pharmacist who all work together to provide you with the care you need, when you need it.   You may see any of the following providers on your designated Care Team at your next follow up:  Dr. Jules Oar Dr. Peder Bourdon Dr. Alwin Baars Dr. Judyth Nunnery Nieves Bars, NP Ruddy Corral, [place] [address] [place], [place] Dennise Fitz, NP  [name] [name], NP Luster Salters, PharmD   Please be sure to bring in all your medications bottles to every appointment.   Need to Contact Us :  If you have any questions or concerns before your next appointment please send us  a message through [place] or call our office at [phone].    TO LEAVE A MESSAGE FOR THE NURSE SELECT OPTION 2, PLEASE LEAVE A MESSAGE INCLUDING: YOUR NAME DATE OF BIRTH CALL BACK NUMBER REASON FOR  CALL**this is important as we prioritize the call backs  YOU WILL RECEIVE A CALL BACK THE SAME DAY AS LONG AS YOU CALL BEFORE 4:00 PM

## 2023-05-28 NOTE — Progress Notes (Signed)
 ADVANCED HEART FAILURE CLINIC NOTE  Primary Care: [place], [place], [place] Primary Cardiologist: Armida Lander, MD HF Cardiologist: Dr. Bruce Caper EP: Marlane Silver, MD   HPI: [name] [name] is a 48 y.o. female with hx of saddle PE/DVT s/p IVC filter (2015), HFrEF/NICM, pancytopenia, nonobstructive CAD, morbid obesity, noncompliance, pancytopenia (followed by hematology), tobacco use.  In 5/23 she was admitted with acute hypoxic respiratory failure requiring intubation. LHC during that admit w/ nonobstructive CAD. She was admitted again in 9/23 with syncope, nausea and C2 fracture managed conservatively. LVEF during that admit noted to be 35% w/ severe posterior MR.   Admitted with acute on chronic CHF in 06/24. EF was down to 25-30%.   Had been considered for mTEER and possible LVAD but was lost to follow-up until recent readmission.  Admitted to [facility] ICU in 04/25 with cardiogenic shock. Transferred to [facility] with worsening hypotension and increased pressor requirements.  Echo 4/25 EF 20-25% severe RV dysfunction with septal flattening mod-severe MR. Required inotrope/pressor support in addition to lasix  gtt, diamox  and metolazone  to fully diurese. Initially on DBA but later transitioned to milrinone  given severely elevated PA pressures on RHC. She was discharged with home inotrope with plan to workup for advanced therapies as an ouptatient if compliance improved. Discharge weight 244 lbs.  Here today for hospital follow-up. Accompanied by her mother who assists with the history. Has been feeling okay. Able to get dressed and prepare food but has little energy for any additional activity. Unable to walk to her mailbox d/t dyspnea and fatigue. Orthopnea and lower extremity edema improved. Home weight has been stable between 244-248 lb. Taking all medications as prescribed.  Smoking 3 cigarettes a day.  Has medicare/medicaid. Able to get transportation to today's visit through insurance  transportation services. Has good family support. No food or housing insecurity.    Past Medical History:  Diagnosis Date   Anemia    Blood transfusion without reported diagnosis    CAD (coronary artery disease)    Nonobstructive   Closed left ankle fracture    August 30 2012   Clotting disorder [facility])    DVT (deep venous thrombosis) (HCC)    L leg   Fibroid tumor    HFrEF (heart failure with reduced ejection fraction) (HCC)    Hidradenitis    Hypertension    Mitral regurgitation    Myocardial infarction (HCC)    NICM (nonischemic cardiomyopathy) (HCC)    NSVT (nonsustained ventricular tachycardia) (HCC)    Obesity    OSA on CPAP    Pulmonary embolism (HCC) 04/2013   Sleep apnea     Current Outpatient Medications  Medication Sig Dispense Refill   atorvastatin  (LIPITOR) 20 MG tablet TAKE ONE TABLET BY MOUTH ONCE DAILY. 90 tablet 3   benzonatate  (TESSALON ) 100 MG capsule Take 1 capsule (100 mg total) by mouth 3 (three) times daily as needed for cough. Do not take with alcohol  or while operating or driving heavy machinery 21 capsule 0   bumetanide  (BUMEX ) 2 MG tablet Take 1 tablet (2 mg total) by mouth daily. 90 tablet 4   digoxin  (LANOXIN ) 0.125 MG tablet Take 1 tablet (0.125 mg total) by mouth daily. 90 tablet 4   milrinone  (PRIMACOR ) 20 MG/100 ML SOLN infusion Inject 0.0318 mg/min into the vein continuous.     potassium chloride  SA (KLOR-CON  M) 20 MEQ tablet Take 2 tablets (40 mEq total) by mouth 2 (two) times daily. 120 tablet 6   spironolactone  (ALDACTONE ) 25 MG  tablet Take 2 tablets (50 mg total) by mouth daily. 180 tablet 4   warfarin (COUMADIN ) 10 MG tablet Take 10 mg (1 tablet) by mouth daily Monday through Friday, then take 5 mg (1/2 tablet) by mouth on Saturday and on Sunday. 90 tablet 4   albuterol  (VENTOLIN  HFA) 108 (90 Base) MCG/ACT inhaler Inhale 2 puffs into the lungs every 2 (two) hours as needed for wheezing or shortness of breath. 18 g 11   No current  facility-administered medications for this encounter.    Allergies  Allergen Reactions   Sulfa  Antibiotics Itching, Swelling and Rash    Lip swelling      Social History   Socioeconomic History   Marital status: Widowed    Spouse name: Not on file   Number of children: Not on file   Years of education: Not on file   Highest education level: Some college, no degree  Occupational History   Not on file  Tobacco Use   Smoking status: Every Day    Types: Cigarettes    Passive exposure: Never   Smokeless tobacco: Never   Tobacco comments:    Smokes 2-3 cigarettes daily as of 02/13/22  Vaping Use   Vaping status: Never Used  Substance and Sexual Activity   Alcohol  use: Yes    Alcohol /week: 2.0 standard drinks of alcohol     Types: 2 Cans of beer per week    Comment: twice a week   Drug use: No   Sexual activity: Not Currently    Birth control/protection: Pill, None  Other Topics Concern   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    Has a 47 year old Aviauna.   Live with parents.   Was working 5 days a weeks.   Not working right now.    Attends church.    Social Drivers of Corporate investment banker Strain: Low Risk  (04/03/2023)   Overall Financial Resource Strain (CARDIA)    Difficulty of Paying Living Expenses: Not very hard  Food Insecurity: No Food Insecurity (05/20/2023)   Hunger Vital Sign    Worried About Running Out of Food in the Last Year: Never true    Ran Out of Food in the Last Year: Never true  Transportation Needs: No Transportation Needs (05/20/2023)   PRAPARE - Administrator, Civil Service (Medical): No    Lack of Transportation (Non-Medical): No  Physical Activity: Insufficiently Active (04/03/2023)   Exercise Vital Sign    Days of Exercise per Week: 2 days    Minutes of Exercise per Session: 10 min  Stress: No Stress Concern Present (04/03/2023)   [employer] of  Occupational Health - Occupational Stress Questionnaire    Feeling of Stress : Only a little  Social Connections: Socially Isolated (04/03/2023)   Social Connection and Isolation Panel [NHANES]    Frequency of Communication with Friends and Family: Twice a week    Frequency of Social Gatherings with Friends and Family: Twice a week    Attends Religious Services: Never    Database administrator or Organizations: No    Attends Banker Meetings: Never    Marital Status: Widowed  Intimate Partner Violence: Not At Risk (05/20/2023)   Humiliation, Afraid, Rape, and Kick questionnaire    Fear of Current or Ex-Partner: No    Emotionally Abused: No    Physically Abused: No  Sexually Abused: No      Family History  Problem Relation Age of Onset   Hypertension Mother    Diabetes Paternal Aunt    Diabetes Paternal Uncle    Other Father        blood clots   Cancer Maternal Grandmother    Heart attack Paternal Grandmother    Colon cancer Neg Hx     PHYSICAL EXAM: Vitals:   05/28/23 1110  BP: 109/89  Pulse: 82  SpO2: 98%   General:  Appears fatigued, chronically ill Neck: + JVD with prominent v waves Cor: Regular rate & rhythm. No rubs, gallops or murmurs. Tunneled internal jugular central line R chest.  Lungs: clear Abdomen: obese, soft, nontender, nondistended. Extremities: trace edema Neuro: alert & orientedx3. Affect pleasant  Wt Readings from Last 3 Encounters:  05/28/23 112.8 kg (248 lb 9.6 oz)  05/26/23 112.5 kg (248 lb)  05/20/23 110.5 kg (243 lb 8 oz)    DATA REVIEW  ECG: SR 91 bpm, PVC  ECHO: 09/24/21: LVEF 30-35%, RV function mildly reduced, severe posterior MR 8/23: LVEF 30-35%, LVID 7.2cm w/ grade III DD, moderate to severe MR. Severely dilated LA.  09/03/17: LVEF 60%-65%.  TEE 2/24: severe eccentric MR, moderately reduced LV function.  07/03/22: LVEF 25-30% 05/07/23: LVEF 25-30%, grade III DD, RV moderately reduced, moderate posterior MR 05/16/23:  LVEF 25-30%, RV moderately reduced, moderate to severe MR   CATH: 06/19/21: 40 to 50% proximal right coronary narrowing. Right dominant anatomy Coronary arteries otherwise normal Significant elevation LVEDP 30 mmHg consistent with acute on chronic combined systolic and diastolic heart failure Mild pulmonary hypertension, mean pressure 30 mmHg.  WHO group II etiology based on hemodysnamics.. Capillary wedge mean 24 mmHg.  V wave to 40 mmHg suggesting some degree of mitral regurgitation. Cardiac output 8.5 L/min with index 3.57 L/min/m. Pulmonary resistance 0.7 Wood units.  RHC on 7.5 DBA 05/13/23: HEMODYNAMICS: RA:       19 mmHg (mean) RV:       90/9, 19 mmHg PA:       90/38 mmHg (55 mean) PCWP: 27 mmHg (mean) with v waves to 45                                      Estimated Fick CO/CI   6.6L/min, 2.74L/min/m2         Thermodilution CO/CI   6.4L/min, 2.66L/min/m2                                      TPG  28  mmHg                                               PVR  4.375 Wood Units  PAPi  2.73       IMPRESSION: Right heart catheterization for end stage cardiomyopathy on dobutamine  7.5. Severely elevated biventricular filling pressures. Normal cardiac output by thermodilution on inotropic support. 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   ASSESSMENT & PLAN: Chronic systolic HF with biventricular dysfunction/stage D cardiomyopathy - LHC in 2023 with nonobstructive CAD.  - RHC 4/25 (on 7.5 DBA): Severely elevated biventricular filling pressures, severe post  capillary PH, normal CO by TD. - Echo 4/25 EF 25-30% moderately reduced RV with mod/sev MR  - NYHA IV. Volume okay. Continue 2 mg bumex  daily. - Continue home Milrinone  0.25 mcg/kg/min. Will need CBC with diff and CMET every 2 weeks.  - Patient seen with Dr. Bensimhon. Suspect LV function slightly better than reported on echo. However, she has significant RV  dysfunction and probably severe MR. Ideally would consider transplant but not a candidate with tobacco use. Likely not an ideal VAD candidate with RV failure. Best option at this point may be mitraclip with eye towards eventual transplant. Will arrange for urgent R/LHC and TEE next week.  - Continue digoxin  0.125 mg daily - Continue spiro 50 mg daily - CMET, BNP, digoxin  level today   2. Severe MR - Echo 4/25 with moderate to severe MR (suspect may be severe) - TEE and RHC next week. Pending results may refer to structural for mTEER.   3. Hx of PE/DVT w/ IVC filter - Submassive saddle PE extending from the distal main pulmonary artery and to the left and right pulmonary arteries with extension into the left upper lobe, left lower lobe and lingula branches and right upper lobe, right middle lobe and right lower lobes on 04/27/2013  - Evaluated at [facility] where thrombophilia work-up was negative (Lupus inhibitor, anticardiolipin antibodies, and anti beta-2  glycoprotein antibodies were negative; protein C activity 85%, protein S activity 76%, factor V Leiden, and prothrombin gene mutation ) - IVC filter in place; could not be removed due to significant thrombus burden on filter at [facility].  - Previously advised against DOACs due to severity of thrombus burden and weight >250lbs.  - Continue Warfarin   4. Obesity  - Body mass index is 35.67 kg/m.  - BMI significantly down with diuresis - consider GLP1-RA at future follow-up   5. CAD - LHC in 5/23 w/ nonobstructive CAD involving RCA - Inferior WMA on most recent echo - Planning LHC next week as part of workup for possible mTEER - No aspirin  with need for anticoagulation. Continue statin.   6. Microcytic anemia - Recieved IV iron  during recent admit  7. Tobacco use - Smoking 3 cigarettes/day - Discussed need for cessation in order to qualify for transplant in the future  Follow-up 3-4 weeks with Dr. Bruce Caper, sooner if needed pending  workup  [facility] PA-C 05/28/23   Patient seen and examined with the above-signed Advanced Practice Provider and/or Housestaff. I personally reviewed laboratory data, imaging studies and relevant notes. I independently examined the patient and formulated the important aspects of the plan. I have edited the note to reflect any of my changes or salient points. I have personally discussed the plan with the patient and/or family.  48 y/o woman as above with severe biventricular HF, CTEPH. Obesity, tobacco use and severe MR  Recently admitted with massive volume overload and low output. Required inotropic support and high-volume diuresis. Discharged on milrinone  with plan for possible advanced therapies  Returns to clinic today. Still struggling with advanced HF symptoms. NYHA IV  General: Obese woman No resp difficulty HEENT: normal Neck: supple. JVP to jaw with prominent v waves Carotids 2+ bilat; no bruits. No lymphadenopathy or thryomegaly appreciated. Cor: PMI nondisplaced. Regular rate & rhythm. 2/6 MT/TR Lungs: clear Abdomen: soft, nontender, nondistended. No hepatosplenomegaly. No bruits or masses. Good bowel sounds. Extremities: no cyanosis, clubbing, rash, tredema Neuro: alert & orientedx3, cranial nerves grossly intact. moves all 4 extremities w/o difficulty. Affect  pleasant   I reviewed all her studies. Ideally, best option would be transplant but not candidate with weight and tobacco use. Not VAD candidate due to severe RV dysfunction. I think only option may be mTEER. I d/w Structural team and iVC filter would not be absolute barrier. Will plan R/L cath and TEE early next week to see if she is candidate. If not, we are likely headed down Palliative pathway. D/w Dr. Bruce Caper.   I spent a total of 55 minutes today: 1) reviewing the patient's medical records including previous charts, labs and recent notes from other providers; 2) examining the patient and counseling them on  their medical issues/explaining the plan of care; 3) adjusting meds as needed and 4) ordering lab work or other needed tests.   Jules Oar, MD  1:32 PM

## 2023-05-29 ENCOUNTER — Inpatient Hospital Stay

## 2023-05-30 DIAGNOSIS — I5043 Acute on chronic combined systolic (congestive) and diastolic (congestive) heart failure: Secondary | ICD-10-CM | POA: Diagnosis not present

## 2023-05-30 DIAGNOSIS — I4729 Other ventricular tachycardia: Secondary | ICD-10-CM | POA: Diagnosis not present

## 2023-05-30 DIAGNOSIS — D509 Iron deficiency anemia, unspecified: Secondary | ICD-10-CM | POA: Diagnosis not present

## 2023-05-30 DIAGNOSIS — Z6835 Body mass index (BMI) 35.0-35.9, adult: Secondary | ICD-10-CM | POA: Diagnosis not present

## 2023-05-30 DIAGNOSIS — I5082 Biventricular heart failure: Secondary | ICD-10-CM | POA: Diagnosis not present

## 2023-05-30 DIAGNOSIS — M48061 Spinal stenosis, lumbar region without neurogenic claudication: Secondary | ICD-10-CM | POA: Diagnosis not present

## 2023-05-30 DIAGNOSIS — I252 Old myocardial infarction: Secondary | ICD-10-CM | POA: Diagnosis not present

## 2023-05-30 DIAGNOSIS — I428 Other cardiomyopathies: Secondary | ICD-10-CM | POA: Diagnosis not present

## 2023-05-30 DIAGNOSIS — I5084 End stage heart failure: Secondary | ICD-10-CM | POA: Diagnosis not present

## 2023-05-30 DIAGNOSIS — M51369 Other intervertebral disc degeneration, lumbar region without mention of lumbar back pain or lower extremity pain: Secondary | ICD-10-CM | POA: Diagnosis not present

## 2023-05-30 DIAGNOSIS — F17210 Nicotine dependence, cigarettes, uncomplicated: Secondary | ICD-10-CM | POA: Diagnosis not present

## 2023-05-30 DIAGNOSIS — E876 Hypokalemia: Secondary | ICD-10-CM | POA: Diagnosis not present

## 2023-05-30 DIAGNOSIS — M5442 Lumbago with sciatica, left side: Secondary | ICD-10-CM | POA: Diagnosis not present

## 2023-05-30 DIAGNOSIS — Z452 Encounter for adjustment and management of vascular access device: Secondary | ICD-10-CM | POA: Diagnosis not present

## 2023-05-30 DIAGNOSIS — M5441 Lumbago with sciatica, right side: Secondary | ICD-10-CM | POA: Diagnosis not present

## 2023-05-30 DIAGNOSIS — I5023 Acute on chronic systolic (congestive) heart failure: Secondary | ICD-10-CM | POA: Diagnosis not present

## 2023-05-30 DIAGNOSIS — I2721 Secondary pulmonary arterial hypertension: Secondary | ICD-10-CM | POA: Diagnosis not present

## 2023-05-30 DIAGNOSIS — M47816 Spondylosis without myelopathy or radiculopathy, lumbar region: Secondary | ICD-10-CM | POA: Diagnosis not present

## 2023-05-30 DIAGNOSIS — G894 Chronic pain syndrome: Secondary | ICD-10-CM | POA: Diagnosis not present

## 2023-05-30 DIAGNOSIS — I110 Hypertensive heart disease with heart failure: Secondary | ICD-10-CM | POA: Diagnosis not present

## 2023-05-30 DIAGNOSIS — R1310 Dysphagia, unspecified: Secondary | ICD-10-CM | POA: Diagnosis not present

## 2023-05-30 DIAGNOSIS — I051 Rheumatic mitral insufficiency: Secondary | ICD-10-CM | POA: Diagnosis not present

## 2023-05-30 DIAGNOSIS — I2510 Atherosclerotic heart disease of native coronary artery without angina pectoris: Secondary | ICD-10-CM | POA: Diagnosis not present

## 2023-05-30 DIAGNOSIS — M5410 Radiculopathy, site unspecified: Secondary | ICD-10-CM | POA: Diagnosis not present

## 2023-05-30 DIAGNOSIS — G4733 Obstructive sleep apnea (adult) (pediatric): Secondary | ICD-10-CM | POA: Diagnosis not present

## 2023-05-30 NOTE — Progress Notes (Signed)
 Multiple attempts made to reach patient regarding pre-procedure instructions for their appointment on Monday May 12th. Unable to reach or leave voicemail. Will attempted again before end of day.

## 2023-06-02 ENCOUNTER — Encounter (HOSPITAL_COMMUNITY)
Admission: RE | Disposition: A | Discharge status: Home / Self Care | Payer: Self-pay | Source: Home / Self Care | Attending: Internal Medicine

## 2023-06-02 ENCOUNTER — Ambulatory Visit (HOSPITAL_BASED_OUTPATIENT_CLINIC_OR_DEPARTMENT_OTHER)
Admission: RE | Admit: 2023-06-02 | Discharge: 2023-06-02 | Disposition: A | Discharge status: Home / Self Care | Source: Ambulatory Visit | Attending: Internal Medicine | Admitting: Internal Medicine

## 2023-06-02 ENCOUNTER — Other Ambulatory Visit: Payer: Self-pay

## 2023-06-02 ENCOUNTER — Encounter (HOSPITAL_COMMUNITY): Payer: Self-pay | Admitting: Internal Medicine

## 2023-06-02 ENCOUNTER — Ambulatory Visit (HOSPITAL_COMMUNITY): Payer: Self-pay | Admitting: Anesthesiology

## 2023-06-02 ENCOUNTER — Ambulatory Visit (HOSPITAL_COMMUNITY)
Admission: RE | Admit: 2023-06-02 | Discharge: 2023-06-02 | Disposition: A | Discharge status: Home / Self Care | Attending: Internal Medicine | Admitting: Internal Medicine

## 2023-06-02 DIAGNOSIS — Z6835 Body mass index (BMI) 35.0-35.9, adult: Secondary | ICD-10-CM | POA: Diagnosis not present

## 2023-06-02 DIAGNOSIS — I5043 Acute on chronic combined systolic (congestive) and diastolic (congestive) heart failure: Secondary | ICD-10-CM

## 2023-06-02 DIAGNOSIS — I340 Nonrheumatic mitral (valve) insufficiency: Secondary | ICD-10-CM | POA: Insufficient documentation

## 2023-06-02 DIAGNOSIS — Z7901 Long term (current) use of anticoagulants: Secondary | ICD-10-CM | POA: Insufficient documentation

## 2023-06-02 DIAGNOSIS — I361 Nonrheumatic tricuspid (valve) insufficiency: Secondary | ICD-10-CM

## 2023-06-02 DIAGNOSIS — Z79899 Other long term (current) drug therapy: Secondary | ICD-10-CM | POA: Diagnosis not present

## 2023-06-02 DIAGNOSIS — G4733 Obstructive sleep apnea (adult) (pediatric): Secondary | ICD-10-CM | POA: Insufficient documentation

## 2023-06-02 DIAGNOSIS — F17210 Nicotine dependence, cigarettes, uncomplicated: Secondary | ICD-10-CM

## 2023-06-02 DIAGNOSIS — I2724 Chronic thromboembolic pulmonary hypertension: Secondary | ICD-10-CM | POA: Diagnosis not present

## 2023-06-02 DIAGNOSIS — I428 Other cardiomyopathies: Secondary | ICD-10-CM | POA: Insufficient documentation

## 2023-06-02 DIAGNOSIS — Z86711 Personal history of pulmonary embolism: Secondary | ICD-10-CM | POA: Diagnosis not present

## 2023-06-02 DIAGNOSIS — I110 Hypertensive heart disease with heart failure: Secondary | ICD-10-CM | POA: Diagnosis not present

## 2023-06-02 DIAGNOSIS — D509 Iron deficiency anemia, unspecified: Secondary | ICD-10-CM | POA: Insufficient documentation

## 2023-06-02 DIAGNOSIS — I2510 Atherosclerotic heart disease of native coronary artery without angina pectoris: Secondary | ICD-10-CM | POA: Insufficient documentation

## 2023-06-02 DIAGNOSIS — I5023 Acute on chronic systolic (congestive) heart failure: Secondary | ICD-10-CM | POA: Insufficient documentation

## 2023-06-02 DIAGNOSIS — I5082 Biventricular heart failure: Secondary | ICD-10-CM | POA: Diagnosis not present

## 2023-06-02 DIAGNOSIS — I5042 Chronic combined systolic (congestive) and diastolic (congestive) heart failure: Secondary | ICD-10-CM

## 2023-06-02 HISTORY — PX: RIGHT HEART CATH: CATH118263

## 2023-06-02 HISTORY — PX: TRANSESOPHAGEAL ECHOCARDIOGRAM (CATH LAB): EP1270

## 2023-06-02 LAB — POCT I-STAT EG7
Acid-Base Excess: 3 mmol/L — ABNORMAL HIGH (ref 0.0–2.0)
Acid-Base Excess: 4 mmol/L — ABNORMAL HIGH (ref 0.0–2.0)
Bicarbonate: 28.9 mmol/L — ABNORMAL HIGH (ref 20.0–28.0)
Bicarbonate: 29.9 mmol/L — ABNORMAL HIGH (ref 20.0–28.0)
Calcium, Ion: 1.16 mmol/L (ref 1.15–1.40)
Calcium, Ion: 1.2 mmol/L (ref 1.15–1.40)
HCT: 38 % (ref 36.0–46.0)
HCT: 39 % (ref 36.0–46.0)
Hemoglobin: 12.9 g/dL (ref 12.0–15.0)
Hemoglobin: 13.3 g/dL (ref 12.0–15.0)
O2 Saturation: 67 %
O2 Saturation: 69 %
Potassium: 3.8 mmol/L (ref 3.5–5.1)
Potassium: 4 mmol/L (ref 3.5–5.1)
Sodium: 138 mmol/L (ref 135–145)
Sodium: 139 mmol/L (ref 135–145)
TCO2: 30 mmol/L (ref 22–32)
TCO2: 31 mmol/L (ref 22–32)
pCO2, Ven: 46.8 mmHg (ref 44–60)
pCO2, Ven: 47.5 mmHg (ref 44–60)
pH, Ven: 7.398 (ref 7.25–7.43)
pH, Ven: 7.406 (ref 7.25–7.43)
pO2, Ven: 35 mmHg (ref 32–45)
pO2, Ven: 37 mmHg (ref 32–45)

## 2023-06-02 LAB — PROTIME-INR
INR: 2.5 — ABNORMAL HIGH (ref 0.8–1.2)
Prothrombin Time: 27 s — ABNORMAL HIGH (ref 11.4–15.2)

## 2023-06-02 LAB — ECHO TEE

## 2023-06-02 SURGERY — RIGHT HEART CATH
Anesthesia: LOCAL

## 2023-06-02 SURGERY — TRANSESOPHAGEAL ECHOCARDIOGRAM (TEE) (CATHLAB)
Anesthesia: Monitor Anesthesia Care

## 2023-06-02 MED ORDER — FUROSEMIDE 10 MG/ML IJ SOLN
80.0000 mg | Freq: Once | INTRAMUSCULAR | Status: AC
  Administered 2023-06-02: 80 mg via INTRAVENOUS

## 2023-06-02 MED ORDER — ONDANSETRON HCL 4 MG/2ML IJ SOLN
4.0000 mg | Freq: Four times a day (QID) | INTRAMUSCULAR | Status: DC | PRN
Start: 2023-06-02 — End: 2023-06-02

## 2023-06-02 MED ORDER — FUROSEMIDE 10 MG/ML IJ SOLN
INTRAMUSCULAR | Status: AC
  Filled 2023-06-02: qty 8

## 2023-06-02 MED ORDER — ASPIRIN 81 MG PO CHEW
CHEWABLE_TABLET | ORAL | Status: AC
  Filled 2023-06-02: qty 1

## 2023-06-02 MED ORDER — PHENYLEPHRINE HCL-NACL 20-0.9 MG/250ML-% IV SOLN
INTRAVENOUS | Status: DC | PRN
  Administered 2023-06-02: 50 ug/min via INTRAVENOUS

## 2023-06-02 MED ORDER — SODIUM CHLORIDE 0.9% FLUSH: 3.0000 mL | Freq: Two times a day (BID) | INTRAVENOUS | Status: DC

## 2023-06-02 MED ORDER — METOLAZONE 2.5 MG PO TABS
2.5000 mg | ORAL_TABLET | Freq: Every day | ORAL | 0 refills | Status: DC | PRN
Start: 2023-06-02 — End: 2023-06-19

## 2023-06-02 MED ORDER — SODIUM CHLORIDE 0.9 % IV SOLN: INTRAVENOUS | Status: DC

## 2023-06-02 MED ORDER — SODIUM CHLORIDE 0.9% FLUSH: 3.0000 mL | INTRAVENOUS | Status: DC | PRN

## 2023-06-02 MED ORDER — HYDRALAZINE HCL 20 MG/ML IJ SOLN: 10.0000 mg | INTRAMUSCULAR | Status: DC | PRN

## 2023-06-02 MED ORDER — LIDOCAINE HCL (PF) 1 % IJ SOLN
INTRAMUSCULAR | Status: DC | PRN
  Administered 2023-06-02: 2 mL

## 2023-06-02 MED ORDER — PROPOFOL 10 MG/ML IV BOLUS
INTRAVENOUS | Status: DC | PRN
  Administered 2023-06-02 (×2): 40 mg via INTRAVENOUS
  Administered 2023-06-02: 60 mg via INTRAVENOUS
  Administered 2023-06-02: 20 mg via INTRAVENOUS

## 2023-06-02 MED ORDER — ACETAMINOPHEN 325 MG PO TABS: 650.0000 mg | ORAL_TABLET | ORAL | Status: DC | PRN

## 2023-06-02 MED ORDER — HEPARIN (PORCINE) IN NACL 2000-0.9 UNIT/L-% IV SOLN
INTRAVENOUS | Status: DC | PRN
  Administered 2023-06-02: 1000 mL

## 2023-06-02 MED ORDER — PHENYLEPHRINE 80 MCG/ML (10ML) SYRINGE FOR IV PUSH (FOR BLOOD PRESSURE SUPPORT)
PREFILLED_SYRINGE | INTRAVENOUS | Status: DC | PRN
  Administered 2023-06-02: 160 ug via INTRAVENOUS

## 2023-06-02 MED ORDER — SODIUM CHLORIDE 0.9 % IV SOLN: 250.0000 mL | INTRAVENOUS | Status: DC | PRN

## 2023-06-02 MED ORDER — LABETALOL HCL 5 MG/ML IV SOLN: 10.0000 mg | INTRAVENOUS | Status: DC | PRN

## 2023-06-02 SURGICAL SUPPLY — 8 items
CATH SWAN GANZ 7F STRAIGHT (CATHETERS) IMPLANT
COVER PRB 48X5XTLSCP FOLD TPE (BAG) IMPLANT
GLIDESHEATH SLENDER 7FR .021G (SHEATH) IMPLANT
KIT MICROPUNCTURE NIT STIFF (SHEATH) IMPLANT
PACK CARDIAC CATHETERIZATION (CUSTOM PROCEDURE TRAY) ×1 IMPLANT
TRANSDUCER W/STOPCOCK (MISCELLANEOUS) IMPLANT
TUBING ART PRESS 72 MALE/FEM (TUBING) IMPLANT
WIRE EMERALD 3MM-J .025X260CM (WIRE) IMPLANT

## 2023-06-02 NOTE — Anesthesia Postprocedure Evaluation (Signed)
 Anesthesia Post Note  Patient: [name] [name]  Procedure(s) Performed: TRANSESOPHAGEAL ECHOCARDIOGRAM     Patient location during evaluation: Cath Lab Anesthesia Type: MAC Level of consciousness: awake and alert Pain management: pain level controlled Vital Signs Assessment: post-procedure vital signs reviewed and stable Respiratory status: spontaneous breathing, nonlabored ventilation, respiratory function stable and patient connected to nasal cannula oxygen Cardiovascular status: blood pressure returned to baseline and stable Postop Assessment: no apparent nausea or vomiting Anesthetic complications: no  No notable events documented.  Last Vitals:  Vitals:   06/02/23 1400 06/02/23 1410  BP: 134/89 127/88  Pulse: 90 87  Resp: (!) 23 16  Temp:    SpO2: 96% 96%    Last Pain:  Vitals:   06/02/23 1303  TempSrc:   PainSc: 0-No pain                 Rosalita Combe

## 2023-06-02 NOTE — Transfer of Care (Signed)
 Immediate Anesthesia Transfer of Care Note  Patient: [name] [name]  Procedure(s) Performed: TRANSESOPHAGEAL ECHOCARDIOGRAM  Patient Location: PACU  Anesthesia Type:MAC  Level of Consciousness: awake and drowsy  Airway & Oxygen Therapy: Patient Spontanous Breathing and Patient connected to nasal cannula oxygen  Post-op Assessment: Report given to RN and Post -op Vital signs reviewed and stable  Post vital signs: Reviewed and stable  Last Vitals:  Vitals Value Taken Time  BP    Temp    Pulse    Resp    SpO2      Last Pain:  Vitals:   06/02/23 1050  TempSrc: Temporal         Complications: No notable events documented.

## 2023-06-02 NOTE — Progress Notes (Incomplete)
 [facility] [address], [place] [address]    Clinic Day:  06/02/2023  Referring physician: Marco Severs, [place]  Patient Care Team: Grooms, Alayne Hubert as PCP - General (Physician Assistant) Amanda Jungling Joyceann No, MD as PCP - Cardiology (Cardiology) Mealor, Donnamae Gaba, MD as PCP - Electrophysiology (Cardiology) Riley Cheadle Windsor Hatcher, MD as Consulting Physician (Gastroenterology) Paulett Boros, MD as Medical Oncologist (Hematology)   ASSESSMENT & PLAN:   Assessment: 1.  Pancytopenia: - Patient seen at the request of Lasalle Pointer, NP for pancytopenia. - CBC (01/11/2022): WBC-2.9, PLT-123, Hb-8.5, MCV-77.6 - Colonoscopy (06/08/2018): Nonbleeding internal hemorrhoids.  Otherwise normal exam. - Received 2 units PRBC on 03/03/2021, status post iron  infusion (Nulecit) x 2 - Iron  deficiency from menorrhagia (5/28 days, 3 days heavy bleeding)   2.  Social/family history: - Lives at home with her mom.  She is currently on disability.  She works as a Engineer, maintenance.  She is currently smoking 3 cigarettes/day.  She used to smoke 1 pack/day for 6 years. - Paternal aunt had anemia requiring blood transfusions.  Paternal first cousin has sickle cell anemia.  No history of malignancies.   3.  Unprovoked DVT and PE: - History of unprovoked saddle PE in 2015, IVC filter was on Xarelto  - History of recurrent unprovoked DVT and thrombectomy in 01/2021 after missing 2 doses of Coumadin  (Coumadin  preferred due to weight more than 250 pounds).   Plan: 1.  Severe iron  deficiency anemia: - She has iron  deficiency anemia from menorrhagia. - Will check CBC today and ferritin and iron  panel. - Will schedule for iron  infusion based on the labs.   2.  Leukopenia and thrombocytopenia: - She had leukopenia and thrombocytopenia for 3 days, likely from antibiotics. - Will check B12, folic acid , MMA and copper  levels.   3.  Right breast swelling with orange peel appearance: - She  was recently admitted to the hospital from 12/28/2021 through 01/11/2022 with cellulitis of the breast treated with IV vancomycin  after failure on oral Augmentin . - US  breast (03/03/2021): Retroareolar abscess 2.7 cm - Right breast skin punch biopsy (12/29/2021): Benign breast parenchyma with acute on chronic inflammation.  No malignancy. - Right breast 2:00 biopsy (02/01/2020): Acute and chronic inflammation consistent with abscess with no carcinoma identified.  Right axillary lymph node biopsy showed scant nodal tissue with pigmented histiocytes. - Physical exam today shows orange peel appearance of the right breast with no erythema or discharge. - We will order breast mammogram with ultrasound.  If it is not clear, will consider MRI of the breast with and without contrast. - RTC after mammogram.  No orders of the defined types were placed in this encounter.     Nadeen Augusta Teague,acting as a Neurosurgeon for Paulett Boros, MD.,have documented all relevant documentation on the behalf of Paulett Boros, MD,as directed by  Paulett Boros, MD while in the presence of Paulett Boros, MD.   ***  Waycross R Teague   5/12/20254:04 PM  CHIEF COMPLAINT:   Diagnosis: ***  Cancer Staging  No matching staging information was found for the patient.    Prior Therapy: ***  Current Therapy:  ***   HISTORY OF PRESENT ILLNESS:   Oncology History   No history exists.     INTERVAL HISTORY:   Kirstine is a 48 y.o. female presenting to clinic today for follow up of right breast swelling, and anemia. She was last seen by me on 01/24/22 in consultation.  Since her last  visit, she has been admitted to the hospital multiple times for chronic diastolic and systolic CHF. She had her first right heart catheter placed on 03/11/22, though she has required multiple replacements, with her most recent yesterday.  Today, she states that she is doing well overall. Her appetite level is at ***%. Her  energy level is at ***%.  PAST MEDICAL HISTORY:   Past Medical History: Past Medical History:  Diagnosis Date   Anemia    Blood transfusion without reported diagnosis    CAD (coronary artery disease)    Nonobstructive   Closed left ankle fracture    August 30 2012   Clotting disorder (HCC)    DVT (deep venous thrombosis) (HCC)    L leg   Fibroid tumor    HFrEF (heart failure with reduced ejection fraction) (HCC)    Hidradenitis    Hypertension    Mitral regurgitation    Myocardial infarction (HCC)    NICM (nonischemic cardiomyopathy) (HCC)    NSVT (nonsustained ventricular tachycardia) (HCC)    Obesity    OSA on CPAP    Pulmonary embolism (HCC) 04/2013   Sleep apnea     Surgical History: Past Surgical History:  Procedure Laterality Date   ABSCESS DRAINAGE Bilateral 01/10/2015   BREAST BIOPSY Right 02/01/2020   benign   COLONOSCOPY WITH PROPOFOL  N/A 06/08/2018   Procedure: COLONOSCOPY WITH PROPOFOL ;  Surgeon: Suzette Espy, MD;  Location: [facility];  Service: Endoscopy;  Laterality: N/A;  2:30pm   CYST REMOVAL TRUNK     IR FLUORO GUIDE CV LINE RIGHT  05/16/2023   IR US  GUIDE VASC ACCESS RIGHT  05/16/2023   IVC FILTER PLACEMENT ([facility] HX)     LOWER EXTREMITY VENOGRAPHY N/A 02/07/2021   Procedure: LOWER EXTREMITY VENOGRAPHY;  Surgeon: Kayla Part, MD;  Location: [facility] INVASIVE CV LAB;  Service: Cardiovascular;  Laterality: N/A;   PERIPHERAL VASCULAR THROMBECTOMY N/A 02/07/2021   Procedure: PERIPHERAL VASCULAR THROMBECTOMY;  Surgeon: Kayla Part, MD;  Location: [facility] INVASIVE CV LAB;  Service: Cardiovascular;  Laterality: N/A;   RIGHT HEART CATH N/A 03/08/2022   Procedure: RIGHT HEART CATH;  Surgeon: Alwin Baars, DO;  Location: [facility] INVASIVE CV LAB;  Service: Cardiovascular;  Laterality: N/A;   RIGHT HEART CATH N/A 10/04/2022   Procedure: RIGHT HEART CATH;  Surgeon: Alwin Baars, DO;  Location: [facility] INVASIVE CV LAB;  Service: Cardiovascular;  Laterality: N/A;    RIGHT HEART CATH N/A 05/13/2023   Procedure: RIGHT HEART CATH;  Surgeon: [name], MD;  Location: [facility] INVASIVE CV LAB;  Service: Cardiovascular;  Laterality: N/A;   RIGHT/LEFT HEART CATH AND CORONARY ANGIOGRAPHY N/A 06/19/2021   Procedure: RIGHT/LEFT HEART CATH AND CORONARY ANGIOGRAPHY;  Surgeon: Arty Binning, MD;  Location: [facility] INVASIVE CV LAB;  Service: Cardiovascular;  Laterality: N/A;   TEE WITHOUT CARDIOVERSION N/A 03/08/2022   Procedure: TRANSESOPHAGEAL ECHOCARDIOGRAM (TEE);  Surgeon: Alwin Baars, DO;  Location: [facility] ENDOSCOPY;  Service: Cardiovascular;  Laterality: N/A;    Social History: Social History   Socioeconomic History   Marital status: Widowed    Spouse name: Not on file   Number of children: Not on file   Years of education: Not on file   Highest education level: Some college, no degree  Occupational History   Not on file  Tobacco Use   Smoking status: Every Day    Types: Cigarettes    Passive exposure: Never   Smokeless tobacco: Never   Tobacco comments:  Smokes 2-3 cigarettes daily as of 02/13/22  Vaping Use   Vaping status: Never Used  Substance and Sexual Activity   Alcohol  use: Yes    Alcohol /week: 2.0 standard drinks of alcohol     Types: 2 Cans of beer per week    Comment: twice a week   Drug use: No   Sexual activity: Not Currently    Birth control/protection: Pill, None  Other Topics Concern   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    Has a 47 year old Aviauna.   Live with parents.   Was working 5 days a weeks.   Not working right now.    Attends church.    Social Drivers of Corporate investment banker Strain: Low Risk  (04/03/2023)   Overall Financial Resource Strain (CARDIA)    Difficulty of Paying Living Expenses: Not very hard  Food Insecurity: No Food Insecurity (05/20/2023)   Hunger Vital Sign    Worried About Running Out of Food in the Last Year: Never true     Ran Out of Food in the Last Year: Never true  Transportation Needs: No Transportation Needs (05/20/2023)   PRAPARE - Administrator, Civil Service (Medical): No    Lack of Transportation (Non-Medical): No  Physical Activity: Insufficiently Active (04/03/2023)   Exercise Vital Sign    Days of Exercise per Week: 2 days    Minutes of Exercise per Session: 10 min  Stress: No Stress Concern Present (04/03/2023)   [employer] of Occupational Health - Occupational Stress Questionnaire    Feeling of Stress : Only a little  Social Connections: Socially Isolated (04/03/2023)   Social Connection and Isolation Panel [NHANES]    Frequency of Communication with Friends and Family: Twice a week    Frequency of Social Gatherings with Friends and Family: Twice a week    Attends Religious Services: Never    Database administrator or Organizations: No    Attends Banker Meetings: Never    Marital Status: Widowed  Intimate Partner Violence: Not At Risk (05/20/2023)   Humiliation, Afraid, Rape, and Kick questionnaire    Fear of Current or Ex-Partner: No    Emotionally Abused: No    Physically Abused: No    Sexually Abused: No    Family History: Family History  Problem Relation Age of Onset   Hypertension Mother    Diabetes Paternal Aunt    Diabetes Paternal Uncle    Other Father        blood clots   Cancer Maternal Grandmother    Heart attack Paternal Grandmother    Colon cancer Neg Hx     Current Medications: No current facility-administered medications for this visit. No current outpatient medications on file.  Facility-Administered Medications Ordered in Other Visits:    0.9 %  sodium chloride  infusion, , Intravenous, Continuous, Bensimhon, Rheta Celestine, MD, [place] at 06/02/23 1223   0.9 %  sodium chloride  infusion, , Intravenous, Continuous, Bensimhon, Rheta Celestine, MD   aspirin  81 MG chewable tablet, , , ,    furosemide  (LASIX ) 10 MG/ML injection, , , ,    Heparin   (Porcine) in NaCl 2000-0.9 UNIT/L-% SOLN, , , PRN, Bensimhon, Daniel R, MD, 1,000 mL at 06/02/23 1520   lidocaine  (PF) (XYLOCAINE ) 1 % injection, , , PRN, Bensimhon, Rheta Celestine, MD, 2 mL at 06/02/23 1533   Allergies: Allergies  Allergen Reactions   Sulfa  Antibiotics Itching, Swelling and Rash    Lip swelling    REVIEW OF SYSTEMS:   Review of Systems  Constitutional:  Negative for chills, fatigue and fever.  HENT:   Negative for lump/mass, mouth sores, nosebleeds, sore throat and trouble swallowing.   Eyes:  Negative for eye problems.  Respiratory:  Negative for cough and shortness of breath.   Cardiovascular:  Negative for chest pain, leg swelling and palpitations.  Gastrointestinal:  Negative for abdominal pain, constipation, diarrhea, nausea and vomiting.  Genitourinary:  Negative for bladder incontinence, difficulty urinating, dysuria, frequency, hematuria and nocturia.   Musculoskeletal:  Negative for arthralgias, back pain, flank pain, myalgias and neck pain.  Skin:  Negative for itching and rash.  Neurological:  Negative for dizziness, headaches and numbness.  Hematological:  Does not bruise/bleed easily.  Psychiatric/Behavioral:  Negative for depression, sleep disturbance and suicidal ideas. The patient is not nervous/anxious.   All other systems reviewed and are negative.    VITALS:   Last menstrual period 03/25/2023.  Wt Readings from Last 3 Encounters:  05/28/23 248 lb 9.6 oz (112.8 kg)  05/26/23 248 lb (112.5 kg)  05/20/23 243 lb 8 oz (110.5 kg)    There is no height or weight on file to calculate BMI.  Performance status (ECOG): {CHL ONC [id number]  PHYSICAL EXAM:   Physical Exam Vitals and nursing note reviewed. Exam conducted with a chaperone present.  Constitutional:      Appearance: Normal appearance.  Cardiovascular:     Rate and Rhythm: Normal rate and regular rhythm.     Pulses: Normal pulses.     Heart sounds: Normal heart sounds.  Pulmonary:      Effort: Pulmonary effort is normal.     Breath sounds: Normal breath sounds.  Abdominal:     Palpations: Abdomen is soft. There is no hepatomegaly, splenomegaly or mass.     Tenderness: There is no abdominal tenderness.  Musculoskeletal:     Right lower leg: No edema.     Left lower leg: No edema.  Lymphadenopathy:     Cervical: No cervical adenopathy.     Right cervical: No superficial, deep or posterior cervical adenopathy.    Left cervical: No superficial, deep or posterior cervical adenopathy.     Upper Body:     Right upper body: No supraclavicular or axillary adenopathy.     Left upper body: No supraclavicular or axillary adenopathy.  Neurological:     General: No focal deficit present.     Mental Status: She is alert and oriented to person, place, and time.  Psychiatric:        Mood and Affect: Mood normal.        Behavior: Behavior normal.     LABS:   CBC     Component Value Date/Time   WBC 4.6 05/28/2023 1224   RBC 4.64 05/28/2023 1224   HGB 10.8 (L) 05/28/2023 1224   HGB 11.7 09/16/2022 0820   HCT 38.0 05/28/2023 1224   HCT 40.3 09/16/2022 0820   PLT 258 05/28/2023 1224   PLT 254 09/16/2022 0820   MCV 81.9 05/28/2023 1224   MCV 80 09/16/2022 0820   MCH 23.3 (L) 05/28/2023 1224   MCHC 28.4 (L) 05/28/2023 1224   RDW 29.2 (H) 05/28/2023 1224   RDW 20.7 (H) 09/16/2022 0820   LYMPHSABS 0.7 05/28/2023 1224   LYMPHSABS 1.0 09/16/2022 0820   MONOABS 0.4 05/28/2023 1224   EOSABS 0.2 05/28/2023 1224  EOSABS 0.2 09/16/2022 0820   BASOSABS 0.1 05/28/2023 1224   BASOSABS 0.1 09/16/2022 0820    CMP      Component Value Date/Time   NA 138 05/28/2023 1224   NA 138 09/16/2022 0820   K 5.1 05/28/2023 1224   CL 98 05/28/2023 1224   CO2 30 05/28/2023 1224   GLUCOSE 90 05/28/2023 1224   BUN 14 05/28/2023 1224   BUN 22 09/16/2022 0820   CREATININE 0.99 05/28/2023 1224   CREATININE 0.74 01/31/2017 1119   CALCIUM  9.7 05/28/2023 1224   PROT 9.0 (H) 05/28/2023  1224   PROT 8.0 09/16/2022 0816   ALBUMIN 3.3 (L) 05/28/2023 1224   ALBUMIN 4.1 09/16/2022 0816   AST 34 05/28/2023 1224   ALT 25 05/28/2023 1224   ALKPHOS 112 05/28/2023 1224   BILITOT 1.6 (H) 05/28/2023 1224   BILITOT 0.6 09/16/2022 0816   GFRNONAA >60 05/28/2023 1224   GFRAA >60 04/13/2019 1344     No results found for: "CEA1", "CEA" / No results found for: "CEA1", "CEA" No results found for: "PSA1" No results found for: "XBJ478" No results found for: "CAN125"  No results found for: "TOTALPROTELP", "ALBUMINELP", "A1GS", "A2GS", "BETS", "BETA2SER", "GAMS", "MSPIKE", "SPEI" Lab Results  Component Value Date   TIBC 468 (H) 05/08/2023   TIBC 460 (H) 09/16/2022   TIBC 459 (H) 01/24/2022   FERRITIN 16 05/08/2023   FERRITIN 40 09/16/2022   FERRITIN 43 01/24/2022   IRONPCTSAT 4 (L) 05/08/2023   IRONPCTSAT 6 (LL) 09/16/2022   IRONPCTSAT 6 (L) 01/24/2022   Lab Results  Component Value Date   LDH 133 12/31/2022   LDH 218 (H) 01/24/2022   LDH 127 04/13/2019     STUDIES:   ECHO TEE Result Date: 12-Jun-2023    TRANSESOPHOGEAL ECHO REPORT   Patient Name:   SHANELLE MAKOWSKI Date of Exam: 06-12-23 Medical Rec #:  [id number]       Height:       70.0 in Accession #:    [id number]      Weight:       248.6 lb Date of Birth:  [birth year]       BSA:          2.289 m Patient Age:    47 years        BP:           126/82 mmHg Patient Gender: F               HR:           83 bpm. Exam Location:  Inpatient Procedure: Transesophageal Echo, 3D Echo, Cardiac Doppler and Color Doppler            (Both Spectral and Color Flow Doppler were utilized during            procedure). Indications:     Mitral valve insufficiency, unspecified etiology [I34.0]  History:         Patient has prior history of Echocardiogram examinations, most                  recent 05/16/2023. CHF and Cardiomyopathy, CAD, Mitral Valve                  Disease; Risk Factors:Hypertension and Sleep Apnea.  Sonographer:     Astrid Blamer  Referring Phys:  [id number] DANIEL R BENSIMHON Diagnosing Phys: Jules Oar MD PROCEDURE: After discussion of the risks and benefits of a TEE, an informed consent  was obtained from the patient. The transesophogeal probe was passed without difficulty through the esophogus of the patient. Sedation performed by different physician. The patient was monitored while under deep sedation. Anesthestetic sedation was provided intravenously by Anesthesiology: 160mg  of Propofol . The patient developed no complications during the procedure.  IMPRESSIONS  1. Left ventricular ejection fraction, by estimation, is 40 to 45%. The left ventricle has mildly decreased function.  2. Right ventricular systolic function is moderately reduced. The right ventricular size is normal.  3. Left atrial size was severely dilated. No left atrial/left atrial appendage thrombus was detected.  4. Right atrial size was severely dilated.  5. Posterior leaflet restriction with severe posterior MR and systolic flow reversal in pulmonary veins. PISA 1.3 cm. MR volume estimated at 93ml with ERO 0.82cm2. The mitral valve is abnormal. Severe mitral valve regurgitation.  6. The aortic valve is normal in structure. Aortic valve regurgitation is not visualized. FINDINGS  Left Ventricle: Left ventricular ejection fraction, by estimation, is 40 to 45%. The left ventricle has mildly decreased function. The left ventricular internal cavity size was normal in size. Right Ventricle: The right ventricular size is normal. Right vetricular wall thickness was not well visualized. Right ventricular systolic function is moderately reduced. Left Atrium: Left atrial size was severely dilated. No left atrial/left atrial appendage thrombus was detected. Right Atrium: Right atrial size was severely dilated. Pericardium: There is no evidence of pericardial effusion. Mitral Valve: Posterior leaflet restriction with severe posterior MR and systolic flow reversal in pulmonary veins.  PISA 1.3 cm. MR volume estimated at 93ml with ERO 0.82cm2. The mitral valve is abnormal. Severe mitral valve regurgitation, with posteriorly-directed jet. Tricuspid Valve: The tricuspid valve is normal in structure. Tricuspid valve regurgitation is mild. Aortic Valve: The aortic valve is normal in structure. Aortic valve regurgitation is not visualized. Pulmonic Valve: The pulmonic valve was normal in structure. Pulmonic valve regurgitation is trivial. Aorta: The aortic root is normal in size and structure. IAS/Shunts: No atrial level shunt detected by color flow Doppler. Jules Oar MD Electronically signed by Jules Oar MD Signature Date/Time: 06/02/2023/2:30:31 PM    Final    EP STUDY Result Date: 06/02/2023 See surgical note for result.  ECHOCARDIOGRAM COMPLETE Result Date: 05/16/2023    ECHOCARDIOGRAM REPORT   Patient Name:   SAREYA SALINGER Date of Exam: 05/16/2023 Medical Rec #:  [id number]       Height:       70.0 in Accession #:    [id number]      Weight:       244.5 lb Date of Birth:  01/23/1975       BSA:          2.273 m Patient Age:    47 years        BP:           129/83 mmHg Patient Gender: F               HR:           88 bpm. Exam Location:  Inpatient Procedure: 2D Echo, Cardiac Doppler and Color Doppler (Both Spectral and Color            Flow Doppler were utilized during procedure). Indications:    Mitral insufficiency I34.0  History:        Patient has prior history of Echocardiogram examinations, most                 recent 05/07/2023. CHF, Signs/Symptoms:Syncope;  Risk                 Factors:Hypertension, Sleep Apnea and Current Smoker.  Sonographer:    Terrilee Few RCS Referring Phys: [phone] LINDSAY NICOLE [facility] IMPRESSIONS  1. Left ventricular ejection fraction, by estimation, is 25 to 30%. The left ventricle has severely decreased function. The left ventricle demonstrates global hypokinesis. The left ventricular internal cavity size was severely dilated. Left ventricular  diastolic parameters are indeterminate.  2. Right ventricular systolic function is moderately reduced. The right ventricular size is moderately enlarged.  3. Left atrial size was severely dilated.  4. Right atrial size was moderately dilated.  5. The posterior leaflet is mildly restrictd with moderate to severe posterior MR (suspect severe).. The mitral valve is normal in structure. Mild to moderate mitral valve regurgitation. No evidence of mitral stenosis.  6. The aortic valve is normal in structure. Aortic valve regurgitation is trivial. No aortic stenosis is present.  7. The inferior vena cava is dilated in size with <50% respiratory variability, suggesting right atrial pressure of 15 mmHg. FINDINGS  Left Ventricle: Left ventricular ejection fraction, by estimation, is 25 to 30%. The left ventricle has severely decreased function. The left ventricle demonstrates global hypokinesis. The left ventricular internal cavity size was severely dilated. There is no left ventricular hypertrophy. Left ventricular diastolic parameters are indeterminate. Right Ventricle: The right ventricular size is moderately enlarged. No increase in right ventricular wall thickness. Right ventricular systolic function is moderately reduced. Left Atrium: Left atrial size was severely dilated. Right Atrium: Right atrial size was moderately dilated. Pericardium: There is no evidence of pericardial effusion. Mitral Valve: The posterior leaflet is mildly restrictd with moderate to severe posterior MR (suspect severe). The mitral valve is normal in structure. Mild to moderate mitral valve regurgitation, with posteriorly-directed jet. No evidence of mitral valve stenosis. Tricuspid Valve: The tricuspid valve is normal in structure. Tricuspid valve regurgitation is mild . No evidence of tricuspid stenosis. Aortic Valve: The aortic valve is normal in structure. Aortic valve regurgitation is trivial. No aortic stenosis is present. Aortic valve peak  gradient measures 9.1 mmHg. Pulmonic Valve: The pulmonic valve was normal in structure. Pulmonic valve regurgitation is trivial. No evidence of pulmonic stenosis. Aorta: The aortic root is normal in size and structure. Venous: The inferior vena cava is dilated in size with less than 50% respiratory variability, suggesting right atrial pressure of 15 mmHg. IAS/Shunts: There is right bowing of the interatrial septum, suggestive of elevated left atrial pressure. No atrial level shunt detected by color flow Doppler.  LEFT VENTRICLE PLAX 2D LVIDd:         7.80 cm      Diastology LVIDs:         6.80 cm      LV e' medial:    9.36 cm/s LV PW:         1.00 cm      LV E/e' medial:  14.3 LV IVS:        0.70 cm      LV e' lateral:   17.60 cm/s LVOT diam:     2.30 cm      LV E/e' lateral: 7.6 LV SV:         71 LV SV Index:   31 LVOT Area:     4.15 cm  LV Volumes (MOD) LV vol d, MOD A2C: 330.0 ml LV vol d, MOD A4C: 271.0 ml LV vol s, MOD A2C: 240.0 ml LV vol  s, MOD A4C: 188.0 ml LV SV MOD A2C:     90.0 ml LV SV MOD A4C:     271.0 ml LV SV MOD BP:      95.9 ml RIGHT VENTRICLE             IVC RV S prime:     11.00 cm/s  IVC diam: 2.70 cm TAPSE (M-mode): 1.6 cm LEFT ATRIUM              Index        RIGHT ATRIUM           Index LA diam:        5.60 cm  2.46 cm/m   RA Area:     20.10 cm LA Vol (A2C):   86.1 ml  37.87 ml/m  RA Volume:   56.55 ml  24.88 ml/m LA Vol (A4C):   133.0 ml 58.50 ml/m LA Biplane Vol: 116.0 ml 51.03 ml/m  AORTIC VALVE AV Area (Vmax): 2.92 cm AV Vmax:        151.00 cm/s AV Peak Grad:   9.1 mmHg LVOT Vmax:      106.00 cm/s LVOT Vmean:     70.800 cm/s LVOT VTI:       0.171 m  AORTA Ao Root diam: 2.60 cm Ao Asc diam:  2.80 cm MITRAL VALVE MV Area (PHT): 4.89 cm     SHUNTS MV Decel Time: 155 msec     Systemic VTI:  0.17 m MV E velocity: 134.00 cm/s  Systemic Diam: 2.30 cm MV A velocity: 52.50 cm/s MV E/A ratio:  2.55 Jules Oar MD Electronically signed by Jules Oar MD Signature Date/Time:  05/16/2023/11:09:51 AM    Final    IR Fluoro Guide CV Line Right Result Date: 05/16/2023 INDICATION: 48 year old with acute on chronic heart failure. Patient needs a tunneled central line for home Milrinone  infusion. EXAM: FLUOROSCOPIC AND ULTRASOUND GUIDED PLACEMENT OF A TUNNELED CENTRAL VENOUS CATHETER Physician: Olive Better. Julietta Ogren, MD FLUOROSCOPY TIME:  Radiation Exposure Index (as provided by the fluoroscopic device): 8 mGy Kerma MEDICATIONS: Zofran  4 mg IV ANESTHESIA/SEDATION: None PROCEDURE: The procedure was explained to the patient. The risks and benefits of the procedure were discussed and the patient's questions were addressed. Informed consent was obtained from the patient. The patient was placed supine on the interventional table. Ultrasound confirmed a patent right internal jugularvein. Ultrasound images were obtained for documentation. The right neck and chest was prepped and draped in a sterile fashion. Maximal barrier sterile technique was utilized including caps, mask, sterile gowns, sterile gloves, sterile drape, hand hygiene and skin antiseptic. The right neck was anesthetized with 1% lidocaine . A small incision was made with #11 blade scalpel. A 21 gauge needle directed into the right internal jugular vein with ultrasound guidance. A micropuncture dilator set was placed. At this point, the patient had nausea and emesis. Patient needed to sit up. Therefore, the micropuncture catheter was removed and a bandage was placed. Patient was given Zofran . After approximately 15 minutes, the patient's symptoms resolved. Right neck and chest was re-prepped and draped in sterile fashion. Using ultrasound guidance, a 21 gauge needle was directed into the right internal jugular vein and micropuncture dilator set was placed. Wire was placed for measurement purposes. A dual lumen Powerline catheter was selected because there were no single lumen Powerline catheters available. The skin below the right clavicle was  anesthetized and a small incision was made with an #11 blade scalpel. A subcutaneous tunnel  was formed to the vein dermatotomy site. The catheter was brought through the tunnel. The vein dermatotomy site was dilated to accommodate a peel-away sheath. The catheter was placed through the peel-away sheath and directed into the central venous structures. The tip of the catheter was placed at the superior cavoatrial junction with fluoroscopy. Fluoroscopic images were obtained for documentation. Both lumens were found to aspirate and flush well. Lumens were flushed with saline. The vein dermatotomy site was closed using Dermabond. The catheter was secured to the skin using Prolene suture. FINDINGS: Catheter tip at the superior cavoatrial junction. Catheter length = 25 cm. COMPLICATIONS: None IMPRESSION: Successful placement of a right jugular tunneled central venous catheter using ultrasound and fluoroscopic guidance. Electronically Signed   By: Elene Griffes M.D.   On: 05/16/2023 11:04   IR US  Guide Vasc Access Right Result Date: 05/16/2023 INDICATION: 48 year old with acute on chronic heart failure. Patient needs a tunneled central line for home Milrinone  infusion. EXAM: FLUOROSCOPIC AND ULTRASOUND GUIDED PLACEMENT OF A TUNNELED CENTRAL VENOUS CATHETER Physician: Olive Better. Julietta Ogren, MD FLUOROSCOPY TIME:  Radiation Exposure Index (as provided by the fluoroscopic device): 8 mGy Kerma MEDICATIONS: Zofran  4 mg IV ANESTHESIA/SEDATION: None PROCEDURE: The procedure was explained to the patient. The risks and benefits of the procedure were discussed and the patient's questions were addressed. Informed consent was obtained from the patient. The patient was placed supine on the interventional table. Ultrasound confirmed a patent right internal jugularvein. Ultrasound images were obtained for documentation. The right neck and chest was prepped and draped in a sterile fashion. Maximal barrier sterile technique was utilized including  caps, mask, sterile gowns, sterile gloves, sterile drape, hand hygiene and skin antiseptic. The right neck was anesthetized with 1% lidocaine . A small incision was made with #11 blade scalpel. A 21 gauge needle directed into the right internal jugular vein with ultrasound guidance. A micropuncture dilator set was placed. At this point, the patient had nausea and emesis. Patient needed to sit up. Therefore, the micropuncture catheter was removed and a bandage was placed. Patient was given Zofran . After approximately 15 minutes, the patient's symptoms resolved. Right neck and chest was re-prepped and draped in sterile fashion. Using ultrasound guidance, a 21 gauge needle was directed into the right internal jugular vein and micropuncture dilator set was placed. Wire was placed for measurement purposes. A dual lumen Powerline catheter was selected because there were no single lumen Powerline catheters available. The skin below the right clavicle was anesthetized and a small incision was made with an #11 blade scalpel. A subcutaneous tunnel was formed to the vein dermatotomy site. The catheter was brought through the tunnel. The vein dermatotomy site was dilated to accommodate a peel-away sheath. The catheter was placed through the peel-away sheath and directed into the central venous structures. The tip of the catheter was placed at the superior cavoatrial junction with fluoroscopy. Fluoroscopic images were obtained for documentation. Both lumens were found to aspirate and flush well. Lumens were flushed with saline. The vein dermatotomy site was closed using Dermabond. The catheter was secured to the skin using Prolene suture. FINDINGS: Catheter tip at the superior cavoatrial junction. Catheter length = 25 cm. COMPLICATIONS: None IMPRESSION: Successful placement of a right jugular tunneled central venous catheter using ultrasound and fluoroscopic guidance. Electronically Signed   By: Elene Griffes M.D.   On: 05/16/2023  11:04   CARDIAC CATHETERIZATION Result Date: 05/13/2023 HEMODYNAMICS: RA:       19 mmHg (mean) RV:  90/9, 19 mmHg PA:       90/38 mmHg (55 mean) PCWP: 27 mmHg (mean) with v waves to 45    Estimated Fick CO/CI   6.6L/min, 2.74L/min/m2 Thermodilution CO/CI   6.4L/min, 2.66L/min/m2    TPG  28  mmHg     PVR  4.375 Wood Units PAPi  2.73  IMPRESSION: Right heart catheterization for end stage cardiomyopathy on dobutamine  7.5. Severely elevated biventricular filling pressures. Normal cardiac output by thermodilution on inotropic support. 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 RECOMMENDATIONS: Increase IV diuresis Transition to milrinone  given severely elevated PA pressures Will need aggressive diuresis and consideration of home inotropes   DG CHEST PORT 1 VIEW Result Date: 05/13/2023 CLINICAL DATA:  Status post Swan-Ganz catheter placement EXAM: PORTABLE CHEST 1 VIEW COMPARISON:  05/12/2023 FINDINGS: New right jugular Swan-Ganz catheter is noted in the right pulmonary artery. Cardiac shadow remains enlarged. Right PICC is seen in satisfactory position. Central vascular congestion is noted increased when compared with the prior exam. IMPRESSION: Worsening central vascular congestion. New Swan-Ganz catheter as described.  No pneumothorax is noted. Electronically Signed   By: Violeta Grey M.D.   On: 05/13/2023 11:25   DG CHEST PORT 1 VIEW Result Date: 05/12/2023 CLINICAL DATA:  Central line EXAM: PORTABLE CHEST 1 VIEW COMPARISON:  05/12/2023 FINDINGS: Cardiomegaly with vascular congestion. Right upper extremity central venous catheter tip courses cranially with the tip positioned over the neck IMPRESSION: 1. Right upper extremity central venous catheter tip courses cranially with the tip positioned over the neck. Recommend repositioning. Subsequent chest x-ray has been performed following PICC repositioning 2. Cardiomegaly with vascular congestion.  Electronically Signed   By: Esmeralda Hedge M.D.   On: 05/12/2023 20:16   DG CHEST PORT 1 VIEW Result Date: 05/12/2023 CLINICAL DATA:  Central line placement EXAM: PORTABLE CHEST 1 VIEW COMPARISON:  05/12/2023, 05/06/2023 FINDINGS: Cardiomegaly with vascular congestion. No pleural effusion or pneumothorax. Right upper extremity central venous catheter with poorly visible tip, suspect that the tip is at the distal SVC. IMPRESSION: 1. Right upper extremity central venous catheter with poorly visible tip, suspect that the tip is at the distal SVC. 2. Cardiomegaly with vascular congestion. Electronically Signed   By: Esmeralda Hedge M.D.   On: 05/12/2023 20:15   US  EKG SITE RITE Result Date: 05/12/2023 If Site Rite image not attached, placement could not be confirmed due to current cardiac rhythm.  DG CHEST PORT 1 VIEW Result Date: 05/12/2023 CLINICAL DATA:  Cough. EXAM: PORTABLE CHEST 1 VIEW COMPARISON:  Radiographs 05/06/2023 and 07/07/2022.  CT 10/10/2022. FINDINGS: 1427 hours. New right arm PICC projects cephalad into the neck, tip not visualized. Repositioning recommended. Stable cardiac enlargement and vascular congestion. There is no confluent airspace disease, pneumothorax or significant pleural effusion. The bones appear unchanged. Telemetry leads overlie the chest. IMPRESSION: 1. New right arm PICC projects cephalad into the neck, tip not visualized. Repositioning and follow-up radiographs recommended. 2. Stable cardiac enlargement and vascular congestion. No evidence of pneumonia. 3. These results will be called to the ordering clinician or representative by the Radiologist Assistant, and communication documented in the PACS or [employer]. Electronically Signed   By: Elmon Hagedorn M.D.   On: 05/12/2023 17:35   US  EKG SITE RITE Result Date: 05/07/2023 If Site Rite image not attached, placement could not be confirmed due to current cardiac rhythm.  US  EKG SITE RITE Result Date: 05/07/2023 If  Site Rite image not attached, placement  could not be confirmed due to current cardiac rhythm.  ECHOCARDIOGRAM COMPLETE Result Date: 05/07/2023    ECHOCARDIOGRAM REPORT   Patient Name:   KASHIA ZALESKY Date of Exam: 05/07/2023 Medical Rec #:  [id number]       Height:       70.0 in Accession #:    [id number]      Weight:       307.8 lb Date of Birth:  1975/04/28       BSA:          2.507 m Patient Age:    47 years        BP:           99/72 mmHg Patient Gender: F               HR:           67 bpm. Exam Location:  [facility] Procedure: 2D Echo, Cardiac Doppler, Color Doppler and Intracardiac            Opacification Agent (Both Spectral and Color Flow Doppler were            utilized during procedure). Indications:    CHF l50.9  History:        Patient has prior history of Echocardiogram examinations, most                 recent 07/03/2022. CHF and Cardiomyopathy, CAD and Previous                 Myocardial Infarction; Risk Factors:Hypertension and Sleep                 Apnea. OSA on CPAP, Tobacco use disorder, Obesity.  Sonographer:    Denese Finn RCS Referring Phys: [phone] EJIROGHENE E EMOKPAE IMPRESSIONS  1. Left ventricular ejection fraction, by estimation, is 25 to 30%. The left ventricle has severely decreased function. The left ventricle demonstrates regional wall motion abnormalities (see scoring diagram/findings for description). The left ventricular internal cavity size was severely dilated. There is mild left ventricular hypertrophy. Left ventricular diastolic parameters are consistent with Grade III diastolic dysfunction (restrictive).  2. Right ventricular systolic function is moderately reduced. The right ventricular size is mildly enlarged. There is normal pulmonary artery systolic pressure. The estimated right ventricular systolic pressure is 33.7 mmHg.  3. Left atrial size was severely dilated.  4. Right atrial size was mild to moderately dilated.  5. The mitral valve is degenerative. Moderate,  posteriorly directed mitral valve regurgitation. Regurgitant volume 42 ml by PISA.  6. The aortic valve is tricuspid. Aortic valve regurgitation is not visualized.  7. The inferior vena cava is dilated in size with <50% respiratory variability, suggesting right atrial pressure of 15 mmHg. Comparison(s): Prior images reviewed side by side. Stable LVEF as 25-30% with wall motion abnormalities consistent with ischemic cardiomyopathy. Moderate mitral regurgitation. FINDINGS  Left Ventricle: Left ventricular ejection fraction, by estimation, is 25 to 30%. The left ventricle has severely decreased function. The left ventricle demonstrates regional wall motion abnormalities. The left ventricular internal cavity size was severely dilated. There is mild left ventricular hypertrophy. Left ventricular diastolic parameters are consistent with Grade III diastolic dysfunction (restrictive).  LV Wall Scoring: The entire lateral wall, entire inferior wall, and apex are akinetic. The entire anterior wall, mid and distal anterior septum, and inferior septum are hypokinetic. The basal anteroseptal segment is normal. Right Ventricle: The right ventricular size is mildly enlarged. No increase in right ventricular wall  thickness. Right ventricular systolic function is moderately reduced. There is normal pulmonary artery systolic pressure. The tricuspid regurgitant velocity is 2.16 m/s, and with an assumed right atrial pressure of 15 mmHg, the estimated right ventricular systolic pressure is 33.7 mmHg. Left Atrium: Left atrial size was severely dilated. Right Atrium: Right atrial size was mild to moderately dilated. Pericardium: Trivial pericardial effusion is present. The pericardial effusion is posterior to the left ventricle. Mitral Valve: The mitral valve is degenerative in appearance. There is mild thickening of the mitral valve leaflet(s). Moderate mitral valve regurgitation, with posteriorly-directed jet. Tricuspid Valve: The  tricuspid valve is grossly normal. Tricuspid valve regurgitation is mild. Aortic Valve: The aortic valve is tricuspid. There is mild aortic valve annular calcification. Aortic valve regurgitation is not visualized. Pulmonic Valve: The pulmonic valve was grossly normal. Pulmonic valve regurgitation is trivial. Aorta: The aortic root is normal in size and structure. Venous: The inferior vena cava is dilated in size with less than 50% respiratory variability, suggesting right atrial pressure of 15 mmHg. IAS/Shunts: No atrial level shunt detected by color flow Doppler. Additional Comments: 3D was performed not requiring image post processing on an independent workstation and was indeterminate.  LEFT VENTRICLE PLAX 2D LVIDd:         7.70 cm   Diastology LVIDs:         6.00 cm   LV e' medial:    5.77 cm/s LV PW:         0.70 cm   LV E/e' medial:  22.2 LV IVS:        1.20 cm   LV e' lateral:   9.57 cm/s LVOT diam:     2.00 cm   LV E/e' lateral: 13.4 LV SV:         54 LV SV Index:   22 LVOT Area:     3.14 cm  RIGHT VENTRICLE RV S prime:     7.83 cm/s TAPSE (M-mode): 1.7 cm LEFT ATRIUM              Index        RIGHT ATRIUM           Index LA diam:        6.60 cm  2.63 cm/m   RA Area:     28.40 cm LA Vol (A2C):   180.0 ml 71.80 ml/m  RA Volume:   97.30 ml  38.81 ml/m LA Vol (A4C):   154.0 ml 61.43 ml/m LA Biplane Vol: 170.0 ml 67.81 ml/m  AORTIC VALVE LVOT Vmax:   89.40 cm/s LVOT Vmean:  65.200 cm/s LVOT VTI:    0.172 m  AORTA Ao Root diam: 3.10 cm MITRAL VALVE                  TRICUSPID VALVE MV Area (PHT): 5.66 cm       TR Peak grad:   18.7 mmHg MV Decel Time: 134 msec       TR Vmax:        216.00 cm/s MR Peak grad:    43.6 mmHg MR Mean grad:    28.0 mmHg    SHUNTS MR Vmax:         330.00 cm/s  Systemic VTI:  0.17 m MR Vmean:        250.0 cm/s   Systemic Diam: 2.00 cm MR PISA:         7.60 cm MR PISA Eff ROA: 49 mm MR PISA Radius:  1.10 cm MV E velocity: 128.00 cm/s MV A velocity: 35.40 cm/s MV E/A ratio:  3.62  Teddie Favre MD Electronically signed by Teddie Favre MD Signature Date/Time: 05/07/2023/2:46:34 PM    Final    DG Chest 2 View Result Date: 05/06/2023 CLINICAL DATA:  Shortness of breath. EXAM: CHEST - 2 VIEW COMPARISON:  Chest radiograph dated 07/07/2022. FINDINGS: Cardiomegaly. Pulmonary vascular congestion with possible mild interstitial edema. No focal consolidation, pleural effusion, or pneumothorax. No acute osseous abnormality. IMPRESSION: Cardiomegaly with pulmonary vascular congestion and possible mild interstitial edema. Electronically Signed   By: Mannie Seek M.D.   On: 05/06/2023 15:34

## 2023-06-02 NOTE — Interval H&P Note (Signed)
 History and Physical Interval Note:  06/02/2023 12:22 PM  [name] [name]  has presented today for surgery, with the diagnosis of MITRALE VALE REGURGITATION.  The various methods of treatment have been discussed with the patient and family. After consideration of risks, benefits and other options for treatment, the patient has consented to  Procedure(s): TRANSESOPHAGEAL ECHOCARDIOGRAM (N/A) as a surgical intervention.  The patient's history has been reviewed, patient examined, no change in status, stable for surgery.  I have reviewed the patient's chart and labs.  Questions were answered to the patient's satisfaction.     Manmeet Arzola

## 2023-06-02 NOTE — Anesthesia Preprocedure Evaluation (Addendum)
 Anesthesia Evaluation  Patient identified by MRN, date of birth, ID band Patient awake    Reviewed: Allergy & Precautions, NPO status , Patient's Chart, lab work & pertinent test results  History of Anesthesia Complications Negative for: history of anesthetic complications  Airway Mallampati: II  TM Distance: >3 FB Neck ROM: Full    Dental  (+) Dental Advisory Given, Teeth Intact   Pulmonary neg shortness of breath, sleep apnea and Continuous Positive Airway Pressure Ventilation , neg COPD, neg recent URI, Current Smoker and Patient abstained from smoking.   breath sounds clear to auscultation       Cardiovascular hypertension, + CAD and +CHF  + Valvular Problems/Murmurs MR  Rhythm:Regular  ECHO 4/25 1. Left ventricular ejection fraction, by estimation, is 25 to 30%. The  left ventricle has severely decreased function. The left ventricle  demonstrates global hypokinesis. The left ventricular internal cavity size  was severely dilated. Left ventricular  diastolic parameters are indeterminate.   2. Right ventricular systolic function is moderately reduced. The right  ventricular size is moderately enlarged.   3. Left atrial size was severely dilated.   4. Right atrial size was moderately dilated.   5. The posterior leaflet is mildly restrictd with moderate to severe  posterior MR (suspect severe).. The mitral valve is normal in structure.  Mild to moderate mitral valve regurgitation. No evidence of mitral  stenosis.   6. The aortic valve is normal in structure. Aortic valve regurgitation is  trivial. No aortic stenosis is present.   7. The inferior vena cava is dilated in size with <50% respiratory  variability, suggesting right atrial pressure of 15 mmHg.     Neuro/Psych negative neurological ROS  negative psych ROS   GI/Hepatic   Endo/Other    Renal/GU      Musculoskeletal   Abdominal   Peds  Hematology    Anesthesia Other Findings   Reproductive/Obstetrics                             Anesthesia Physical Anesthesia Plan  ASA: 4  Anesthesia Plan: MAC   Post-op Pain Management: Minimal or no pain anticipated   Induction: Intravenous  PONV Risk Score and Plan: 1 and Propofol  infusion and Treatment may vary due to age or medical condition  Airway Management Planned: Natural Airway and Simple Face Mask  Additional Equipment: None  Intra-op Plan:   Post-operative Plan:   Informed Consent: I have reviewed the patients History and Physical, chart, labs and discussed the procedure including the risks, benefits and alternatives for the proposed anesthesia with the patient or authorized representative who has indicated his/her understanding and acceptance.     Dental advisory given  Plan Discussed with: CRNA and Anesthesiologist  Anesthesia Plan Comments:        Anesthesia Quick Evaluation

## 2023-06-02 NOTE — Interval H&P Note (Signed)
 History and Physical Interval Note:  06/02/2023 12:23 PM  [name] [name]  has presented today for surgery, with the diagnosis of heart failure.  The various methods of treatment have been discussed with the patient and family. After consideration of risks, benefits and other options for treatment, the patient has consented to  Procedure(s): RIGHT/LEFT HEART CATH AND CORONARY ANGIOGRAPHY (N/A) as a surgical intervention.  The patient's history has been reviewed, patient examined, no change in status, stable for surgery.  I have reviewed the patient's chart and labs.  Questions were answered to the patient's satisfaction.     Nellene Courtois

## 2023-06-02 NOTE — CV Procedure (Signed)
    TRANSESOPHAGEAL ECHOCARDIOGRAM   NAME:  LANEICE SHREWSBURY   MRN: [id number] DOB:  May 31, 1975   ADMIT DATE: 06/02/2023  INDICATIONS:  Mitral regurgitation  PROCEDURE:   Informed consent was obtained prior to the procedure. The risks, benefits and alternatives for the procedure were discussed and the patient comprehended these risks.  Risks include, but are not limited to, cough, sore throat, vomiting, nausea, somnolence, esophageal and stomach trauma or perforation, bleeding, low blood pressure, aspiration, pneumonia, infection, trauma to the teeth and death.    After a procedural time-out, the patient was sedated by the anesthesia service. Once an appropriate level of sedation was achieved, the transesophageal probe was inserted in the esophagus and stomach without difficulty and multiple views were obtained.    COMPLICATIONS:    There were no immediate complications.  FINDINGS:  LEFT VENTRICLE: EF = 40-45% global HK  RIGHT VENTRICLE: Moderate HK   LEFT ATRIUM: Severely enlarged  LEFT ATRIAL APPENDAGE: No thrombus.   RIGHT ATRIUM: Severely enlarged  AORTIC VALVE:  Trileaflet. No AI/AS  MITRAL VALVE:    Restricted posterior leaflet with severe posterior MR. + systolic flow reversal in pulmonary veins  TRICUSPID VALVE: Normal. Mild TR  PULMONIC VALVE: Grossly normal. Trivial PI  INTERATRIAL SEPTUM: No PFO or ASD.  PERICARDIUM: No effusion  DESCENDING AORTA: Mild plaque    Darthy Manganelli,MD 1:30 PM

## 2023-06-02 NOTE — Discharge Instructions (Signed)

## 2023-06-03 ENCOUNTER — Ambulatory Visit: Attending: Cardiology | Admitting: *Deleted

## 2023-06-03 ENCOUNTER — Encounter (HOSPITAL_COMMUNITY): Payer: Self-pay | Admitting: Internal Medicine

## 2023-06-03 ENCOUNTER — Inpatient Hospital Stay: Admitting: Hematology

## 2023-06-03 DIAGNOSIS — Z86711 Personal history of pulmonary embolism: Secondary | ICD-10-CM | POA: Diagnosis not present

## 2023-06-03 DIAGNOSIS — Z5181 Encounter for therapeutic drug level monitoring: Secondary | ICD-10-CM | POA: Diagnosis not present

## 2023-06-03 DIAGNOSIS — Z86718 Personal history of other venous thrombosis and embolism: Secondary | ICD-10-CM

## 2023-06-03 LAB — POCT INR: INR: 3.3 — AB (ref 2.0–3.0)

## 2023-06-03 NOTE — Patient Instructions (Signed)
 Hold warfarin tonight then decrease dose to 1 tablet daily except 1/2 tablet on Sundays, Tuesdays and Thursdays On continuous IV Milrinone  now.  [facility] Recheck in 2 wk Potential LVAD in future

## 2023-06-03 NOTE — Progress Notes (Signed)
 ASA 81 mg given pre TEE procedure for heart cath later today.  Late entry unable to chart in [facility].

## 2023-06-04 ENCOUNTER — Telehealth: Payer: Self-pay

## 2023-06-04 NOTE — Telephone Encounter (Signed)
 Margrette Shield:   Fossa looks good for transseptal approach. Enough space within the LA to achieve straddle and manipulate the device down to the valve plane. 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 Posterior leaflet length measured >0.9cm. A large valve area was measured in the short axis transgastric at ~7.0cm2 and the gradient was not seen. Will assume the patient's gradient can tolerate at least 1 implant. Color on the transgastric showed MR expanding across the valve from the medial to lateral side of A2P2. Patient's regurgitant volume of 93ml also suggests higher probability for a 2-clip case. Plan on placing first clip on the medial side of A2P2, with a plan to place a second clip adjacent and just lateral to the first. Place first clip and see how the MR responds. XTW appropriate.

## 2023-06-05 ENCOUNTER — Inpatient Hospital Stay: Admitting: Hematology

## 2023-06-05 ENCOUNTER — Telehealth: Payer: Self-pay

## 2023-06-05 NOTE — Telephone Encounter (Signed)
 Comments:  Coaptation defect with small flail noted in the region of central A2P2. Color jet is originated centrally and directed posterior.  Adequate leaflet lengths for grasping, Generous MVA >7cm2. Gradient was not measured.  Suggest placing a single ACE device in the region of central A2P2."

## 2023-06-06 DIAGNOSIS — I252 Old myocardial infarction: Secondary | ICD-10-CM | POA: Diagnosis not present

## 2023-06-06 DIAGNOSIS — E876 Hypokalemia: Secondary | ICD-10-CM | POA: Diagnosis not present

## 2023-06-06 DIAGNOSIS — F17210 Nicotine dependence, cigarettes, uncomplicated: Secondary | ICD-10-CM | POA: Diagnosis not present

## 2023-06-06 DIAGNOSIS — I2510 Atherosclerotic heart disease of native coronary artery without angina pectoris: Secondary | ICD-10-CM | POA: Diagnosis not present

## 2023-06-06 DIAGNOSIS — Z6835 Body mass index (BMI) 35.0-35.9, adult: Secondary | ICD-10-CM | POA: Diagnosis not present

## 2023-06-06 DIAGNOSIS — Z452 Encounter for adjustment and management of vascular access device: Secondary | ICD-10-CM | POA: Diagnosis not present

## 2023-06-06 DIAGNOSIS — I2721 Secondary pulmonary arterial hypertension: Secondary | ICD-10-CM | POA: Diagnosis not present

## 2023-06-06 DIAGNOSIS — I5084 End stage heart failure: Secondary | ICD-10-CM | POA: Diagnosis not present

## 2023-06-06 DIAGNOSIS — M47816 Spondylosis without myelopathy or radiculopathy, lumbar region: Secondary | ICD-10-CM | POA: Diagnosis not present

## 2023-06-06 DIAGNOSIS — I110 Hypertensive heart disease with heart failure: Secondary | ICD-10-CM | POA: Diagnosis not present

## 2023-06-06 DIAGNOSIS — M5441 Lumbago with sciatica, right side: Secondary | ICD-10-CM | POA: Diagnosis not present

## 2023-06-06 DIAGNOSIS — G894 Chronic pain syndrome: Secondary | ICD-10-CM | POA: Diagnosis not present

## 2023-06-06 DIAGNOSIS — R1310 Dysphagia, unspecified: Secondary | ICD-10-CM | POA: Diagnosis not present

## 2023-06-06 DIAGNOSIS — M48061 Spinal stenosis, lumbar region without neurogenic claudication: Secondary | ICD-10-CM | POA: Diagnosis not present

## 2023-06-06 DIAGNOSIS — M5410 Radiculopathy, site unspecified: Secondary | ICD-10-CM | POA: Diagnosis not present

## 2023-06-06 DIAGNOSIS — I5043 Acute on chronic combined systolic (congestive) and diastolic (congestive) heart failure: Secondary | ICD-10-CM | POA: Diagnosis not present

## 2023-06-06 DIAGNOSIS — I428 Other cardiomyopathies: Secondary | ICD-10-CM | POA: Diagnosis not present

## 2023-06-06 DIAGNOSIS — I5082 Biventricular heart failure: Secondary | ICD-10-CM | POA: Diagnosis not present

## 2023-06-06 DIAGNOSIS — M51369 Other intervertebral disc degeneration, lumbar region without mention of lumbar back pain or lower extremity pain: Secondary | ICD-10-CM | POA: Diagnosis not present

## 2023-06-06 DIAGNOSIS — M5442 Lumbago with sciatica, left side: Secondary | ICD-10-CM | POA: Diagnosis not present

## 2023-06-06 DIAGNOSIS — I4729 Other ventricular tachycardia: Secondary | ICD-10-CM | POA: Diagnosis not present

## 2023-06-06 DIAGNOSIS — I5023 Acute on chronic systolic (congestive) heart failure: Secondary | ICD-10-CM | POA: Diagnosis not present

## 2023-06-06 DIAGNOSIS — D509 Iron deficiency anemia, unspecified: Secondary | ICD-10-CM | POA: Diagnosis not present

## 2023-06-06 DIAGNOSIS — I051 Rheumatic mitral insufficiency: Secondary | ICD-10-CM | POA: Diagnosis not present

## 2023-06-06 DIAGNOSIS — G4733 Obstructive sleep apnea (adult) (pediatric): Secondary | ICD-10-CM | POA: Diagnosis not present

## 2023-06-09 ENCOUNTER — Inpatient Hospital Stay: Attending: Hematology | Admitting: Hematology

## 2023-06-09 ENCOUNTER — Inpatient Hospital Stay

## 2023-06-09 VITALS — BP 124/88 | HR 99 | Temp 98.3°F | Resp 20 | Wt 237.0 lb

## 2023-06-09 DIAGNOSIS — Z79899 Other long term (current) drug therapy: Secondary | ICD-10-CM | POA: Diagnosis not present

## 2023-06-09 DIAGNOSIS — Z801 Family history of malignant neoplasm of trachea, bronchus and lung: Secondary | ICD-10-CM | POA: Insufficient documentation

## 2023-06-09 DIAGNOSIS — F17210 Nicotine dependence, cigarettes, uncomplicated: Secondary | ICD-10-CM | POA: Diagnosis not present

## 2023-06-09 DIAGNOSIS — D500 Iron deficiency anemia secondary to blood loss (chronic): Secondary | ICD-10-CM

## 2023-06-09 DIAGNOSIS — Z7901 Long term (current) use of anticoagulants: Secondary | ICD-10-CM | POA: Diagnosis not present

## 2023-06-09 DIAGNOSIS — Z86711 Personal history of pulmonary embolism: Secondary | ICD-10-CM | POA: Insufficient documentation

## 2023-06-09 DIAGNOSIS — Z832 Family history of diseases of the blood and blood-forming organs and certain disorders involving the immune mechanism: Secondary | ICD-10-CM | POA: Diagnosis not present

## 2023-06-09 DIAGNOSIS — E611 Iron deficiency: Secondary | ICD-10-CM | POA: Diagnosis not present

## 2023-06-09 LAB — CBC WITH DIFFERENTIAL/PLATELET
Abs Immature Granulocytes: 0 10*3/uL (ref 0.00–0.07)
Basophils Absolute: 0.1 10*3/uL (ref 0.0–0.1)
Basophils Relative: 1 %
Eosinophils Absolute: 0.1 10*3/uL (ref 0.0–0.5)
Eosinophils Relative: 2 %
HCT: 44.6 % (ref 36.0–46.0)
Hemoglobin: 13 g/dL (ref 12.0–15.0)
Immature Granulocytes: 0 %
Lymphocytes Relative: 11 %
Lymphs Abs: 0.5 10*3/uL — ABNORMAL LOW (ref 0.7–4.0)
MCH: 24 pg — ABNORMAL LOW (ref 26.0–34.0)
MCHC: 29.1 g/dL — ABNORMAL LOW (ref 30.0–36.0)
MCV: 82.4 fL (ref 80.0–100.0)
Monocytes Absolute: 0.3 10*3/uL (ref 0.1–1.0)
Monocytes Relative: 7 %
Neutro Abs: 3.4 10*3/uL (ref 1.7–7.7)
Neutrophils Relative %: 79 %
Platelets: 282 10*3/uL (ref 150–400)
RBC: 5.41 MIL/uL — ABNORMAL HIGH (ref 3.87–5.11)
RDW: 27.6 % — ABNORMAL HIGH (ref 11.5–15.5)
WBC: 4.4 10*3/uL (ref 4.0–10.5)
nRBC: 0 % (ref 0.0–0.2)

## 2023-06-09 LAB — IRON AND TIBC
Iron: 111 ug/dL (ref 28–170)
Saturation Ratios: 21 % (ref 10.4–31.8)
TIBC: 538 ug/dL — ABNORMAL HIGH (ref 250–450)
UIBC: 427 ug/dL

## 2023-06-09 LAB — RETICULOCYTES
Immature Retic Fract: 12.5 % (ref 2.3–15.9)
RBC.: 5.41 MIL/uL — ABNORMAL HIGH (ref 3.87–5.11)
Retic Count, Absolute: 61.7 10*3/uL (ref 19.0–186.0)
Retic Ct Pct: 1.1 % (ref 0.4–3.1)

## 2023-06-09 LAB — VITAMIN B12: Vitamin B-12: 418 pg/mL (ref 180–914)

## 2023-06-09 LAB — FERRITIN: Ferritin: 90 ng/mL (ref 11–307)

## 2023-06-09 LAB — FOLATE: Folate: 14.9 ng/mL (ref >5.9)

## 2023-06-09 NOTE — Progress Notes (Signed)
 [facility] [address] [place] [address]    Clinic Day:  06/09/2023  Referring physician: Marco Severs, [place]  Patient Care Team: Grooms, Alayne Hubert as PCP - General (Physician Assistant) Amanda Jungling Joyceann No, MD as PCP - Cardiology (Cardiology) Mealor, Donnamae Gaba, MD as PCP - Electrophysiology (Cardiology) Riley Cheadle Windsor Hatcher, MD as Consulting Physician (Gastroenterology) Paulett Boros, MD as Medical Oncologist (Hematology)   ASSESSMENT & PLAN:   Assessment: 1.  Iron  deficiency state: - Patient seen at the request of Jearlean Mince Grooms, PA-C - Colonoscopy (06/08/2018): Nonbleeding internal hemorrhoids.  Otherwise normal exam. - Received 2 units PRBC on 03/03/2021, status post iron  infusion (Nulecit) x 2 - Iron  deficiency from menorrhagia (5/28 days, 3 days heavy bleeding)   2.  Social/family history: - She lives at her home with her mother.  She is currently on disability.  Currently smokes about 4 cigarettes/day.  Used to smoke 1 pack/day for 6 years. - Paternal grandmother and paternal uncles had anemia.  Paternal first cousin has sickle cell disease.  Maternal grandmother had lung cancer.   3.  Unprovoked DVT and PE: - History of unprovoked saddle PE in 2015, IVC filter was on Xarelto  - History of recurrent unprovoked DVT and thrombectomy in 01/2021 after missing 2 doses of Coumadin  (Coumadin  preferred due to weight more than 250 pounds)  Plan: 1.  Iron  deficiency state: - She is sent to me by Jearlean Mince Grooms, PA-C for iron  deficiency.  She is not on any iron  supplements.  Last menstrual period was in February/March of this year.   - Will check her CBC, ferritin, iron  panel, B12, folic acid , MMA, copper , LDH and reticulocyte counts. - Return to clinic in 1 to 2 weeks for follow-up.     No orders of the defined types were placed in this encounter.     [name] [name],acting as a Neurosurgeon for Paulett Boros, MD.,have documented all  relevant documentation on the behalf of Paulett Boros, MD,as directed by  Paulett Boros, MD while in the presence of Paulett Boros, MD.   I, Paulett Boros MD, have reviewed the above documentation for accuracy and completeness, and I agree with the above.   Williston R [name]   5/19/20257:33 AM  CHIEF COMPLAINT:   Diagnosis: Anemia  Prior Therapy: Iron  deficiency state  Current Therapy: Under workup   HISTORY OF PRESENT ILLNESS:   Oncology History   No history exists.     INTERVAL HISTORY:   [name] [name] is a 48 y.o. female presenting to clinic today for follow up of pancytopenia and right breast swelling. She was last seen by me on 01/24/22 in consultation.  Since her last visit, she underwent diagnostic bilateral mammogram and US  on 02/07/22 that found: Severe diffuse right breast edema with no mammographically or sonographically visible mass or fluid collection. No evidence of malignancy in the left breast.  [name] [name] has been admitted to the hospital multiple times. Her mos recent hospital admission was from 05/06/23 to 05/16/23 due to systolic CHF, requiring right heart catheter placement.   Today, she states that she is doing well overall. Her appetite level is at 50%. Her energy level is at 50%.  PAST MEDICAL HISTORY:   Past Medical History: Past Medical History:  Diagnosis Date   Anemia    Blood transfusion without reported diagnosis    CAD (coronary artery disease)    Nonobstructive   Closed left ankle fracture    August 30 2012   Clotting disorder (  HCC)    DVT (deep venous thrombosis) (HCC)    L leg   Fibroid tumor    HFrEF (heart failure with reduced ejection fraction) (HCC)    Hidradenitis    Hypertension    Mitral regurgitation    Myocardial infarction (HCC)    NICM (nonischemic cardiomyopathy) (HCC)    NSVT (nonsustained ventricular tachycardia) (HCC)    Obesity    OSA on CPAP    Pulmonary embolism (HCC) 04/2013   Sleep apnea      Surgical History: Past Surgical History:  Procedure Laterality Date   ABSCESS DRAINAGE Bilateral 01/10/2015   BREAST BIOPSY Right 02/01/2020   benign   COLONOSCOPY WITH PROPOFOL  N/A 06/08/2018   Procedure: COLONOSCOPY WITH PROPOFOL ;  Surgeon: Suzette Espy, MD;  Location: [facility];  Service: Endoscopy;  Laterality: N/A;  2:30pm   CYST REMOVAL TRUNK     IR FLUORO GUIDE CV LINE RIGHT  05/16/2023   IR US  GUIDE VASC ACCESS RIGHT  05/16/2023   IVC FILTER PLACEMENT ([facility] HX)     LOWER EXTREMITY VENOGRAPHY N/A 02/07/2021   Procedure: LOWER EXTREMITY VENOGRAPHY;  Surgeon: Kayla Part, MD;  Location: [facility] INVASIVE CV LAB;  Service: Cardiovascular;  Laterality: N/A;   PERIPHERAL VASCULAR THROMBECTOMY N/A 02/07/2021   Procedure: PERIPHERAL VASCULAR THROMBECTOMY;  Surgeon: Kayla Part, MD;  Location: [facility] INVASIVE CV LAB;  Service: Cardiovascular;  Laterality: N/A;   RIGHT HEART CATH N/A 03/08/2022   Procedure: RIGHT HEART CATH;  Surgeon: Alwin Baars, DO;  Location: [facility] INVASIVE CV LAB;  Service: Cardiovascular;  Laterality: N/A;   RIGHT HEART CATH N/A 10/04/2022   Procedure: RIGHT HEART CATH;  Surgeon: Alwin Baars, DO;  Location: [facility] INVASIVE CV LAB;  Service: Cardiovascular;  Laterality: N/A;   RIGHT HEART CATH N/A 05/13/2023   Procedure: RIGHT HEART CATH;  Surgeon: [name], MD;  Location: [facility] INVASIVE CV LAB;  Service: Cardiovascular;  Laterality: N/A;   RIGHT HEART CATH N/A 06/02/2023   Procedure: RIGHT HEART CATH;  Surgeon: Mardell Shade, MD;  Location: [facility] INVASIVE CV LAB;  Service: Cardiovascular;  Laterality: N/A;   RIGHT/LEFT HEART CATH AND CORONARY ANGIOGRAPHY N/A 06/19/2021   Procedure: RIGHT/LEFT HEART CATH AND CORONARY ANGIOGRAPHY;  Surgeon: Arty Binning, MD;  Location: [facility] INVASIVE CV LAB;  Service: Cardiovascular;  Laterality: N/A;   TEE WITHOUT CARDIOVERSION N/A 03/08/2022   Procedure: TRANSESOPHAGEAL ECHOCARDIOGRAM (TEE);  Surgeon: Alwin Baars, DO;  Location: [facility] ENDOSCOPY;  Service: Cardiovascular;  Laterality: N/A;   TRANSESOPHAGEAL ECHOCARDIOGRAM (CATH LAB) N/A 06/02/2023   Procedure: TRANSESOPHAGEAL ECHOCARDIOGRAM;  Surgeon: Mardell Shade, MD;  Location: [facility] INVASIVE CV LAB;  Service: Cardiovascular;  Laterality: N/A;    Social History: Social History   Socioeconomic History   Marital status: Widowed    Spouse name: Not on file   Number of children: Not on file   Years of education: Not on file   Highest education level: Some college, no degree  Occupational History   Not on file  Tobacco Use   Smoking status: Every Day    Types: Cigarettes    Passive exposure: Never   Smokeless tobacco: Never   Tobacco comments:    Smokes 2-3 cigarettes daily as of 02/13/22  Vaping Use   Vaping status: Never Used  Substance and Sexual Activity   Alcohol  use: Yes    Alcohol /week: 2.0 standard drinks of alcohol     Types: 2 Cans of beer per week    Comment:  twice a week   Drug use: No   Sexual activity: Not Currently    Birth control/protection: Pill, None  Other Topics Concern   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    Has a 47 year old Aviauna.   Live with parents.   Was working 5 days a weeks.   Not working right now.    Attends church.    Social Drivers of Corporate investment banker Strain: Low Risk  (04/03/2023)   Overall Financial Resource Strain (CARDIA)    Difficulty of Paying Living Expenses: Not very hard  Food Insecurity: No Food Insecurity (05/20/2023)   Hunger Vital Sign    Worried About Running Out of Food in the Last Year: Never true    Ran Out of Food in the Last Year: Never true  Transportation Needs: No Transportation Needs (05/20/2023)   PRAPARE - Administrator, Civil Service (Medical): No    Lack of Transportation (Non-Medical): No  Physical Activity: Insufficiently Active (04/03/2023)   Exercise Vital Sign    Days  of Exercise per Week: 2 days    Minutes of Exercise per Session: 10 min  Stress: No Stress Concern Present (04/03/2023)   [employer] of Occupational Health - Occupational Stress Questionnaire    Feeling of Stress : Only a little  Social Connections: Socially Isolated (04/03/2023)   Social Connection and Isolation Panel [NHANES]    Frequency of Communication with Friends and Family: Twice a week    Frequency of Social Gatherings with Friends and Family: Twice a week    Attends Religious Services: Never    Database administrator or Organizations: No    Attends Banker Meetings: Never    Marital Status: Widowed  Intimate Partner Violence: Not At Risk (05/20/2023)   Humiliation, Afraid, Rape, and Kick questionnaire    Fear of Current or Ex-Partner: No    Emotionally Abused: No    Physically Abused: No    Sexually Abused: No    Family History: Family History  Problem Relation Age of Onset   Hypertension Mother    Diabetes Paternal Aunt    Diabetes Paternal Uncle    Other Father        blood clots   Cancer Maternal Grandmother    Heart attack Paternal Grandmother    Colon cancer Neg Hx     Current Medications:  Current Outpatient Medications:    albuterol  (VENTOLIN  HFA) 108 (90 Base) MCG/ACT inhaler, Inhale 2 puffs into the lungs every 2 (two) hours as needed for wheezing or shortness of breath., Disp: 18 g, Rfl: 11   atorvastatin  (LIPITOR) 20 MG tablet, TAKE ONE TABLET BY MOUTH ONCE DAILY., Disp: 90 tablet, Rfl: 3   benzonatate  (TESSALON ) 100 MG capsule, Take 1 capsule (100 mg total) by mouth 3 (three) times daily as needed for cough. Do not take with alcohol  or while operating or driving heavy machinery, Disp: 21 capsule, Rfl: 0   bumetanide  (BUMEX ) 2 MG tablet, Take 1 tablet (2 mg total) by mouth daily., Disp: 90 tablet, Rfl: 4   digoxin  (LANOXIN ) 0.125 MG tablet, Take 1 tablet (0.125 mg total) by mouth daily., Disp: 90 tablet, Rfl: 4   metolazone  (ZAROXOLYN )  2.5 MG tablet, Take 1 tablet (2.5 mg total) by mouth daily as needed (Take for 2 days Mon and Tues this week. Do not take more unless instructed  to do so. Take with 3 extra tablets of potassium (kcl 60 total))., Disp: 30 tablet, Rfl: 0   milrinone  (PRIMACOR ) 20 MG/100 ML SOLN infusion, Inject 0.0318 mg/min into the vein continuous., Disp: , Rfl:    potassium chloride  SA (KLOR-CON  M) 20 MEQ tablet, Take 2 tablets (40 mEq total) by mouth 2 (two) times daily., Disp: 120 tablet, Rfl: 6   spironolactone  (ALDACTONE ) 25 MG tablet, Take 2 tablets (50 mg total) by mouth daily., Disp: 180 tablet, Rfl: 4   warfarin (COUMADIN ) 10 MG tablet, Take 10 mg (1 tablet) by mouth daily Monday through Friday, then take 5 mg (1/2 tablet) by mouth on Saturday and on Sunday., Disp: 90 tablet, Rfl: 4   Allergies: Allergies  Allergen Reactions   Sulfa  Antibiotics Itching, Swelling and Rash    Lip swelling    REVIEW OF SYSTEMS:   Review of Systems  Constitutional:  Negative for chills, fatigue and fever.  HENT:   Negative for lump/mass, mouth sores, nosebleeds, sore throat and trouble swallowing.   Eyes:  Negative for eye problems.  Respiratory:  Positive for shortness of breath. Negative for cough.   Cardiovascular:  Negative for chest pain, leg swelling and palpitations.  Gastrointestinal:  Negative for abdominal pain, constipation, diarrhea, nausea and vomiting.  Genitourinary:  Negative for bladder incontinence, difficulty urinating, dysuria, frequency, hematuria and nocturia.   Musculoskeletal:  Positive for back pain. Negative for arthralgias, flank pain, myalgias and neck pain.  Skin:  Negative for itching and rash.  Neurological:  Positive for headaches. Negative for dizziness and numbness.  Hematological:  Does not bruise/bleed easily.  Psychiatric/Behavioral:  Positive for sleep disturbance. Negative for depression and suicidal ideas. The patient is not nervous/anxious.   All other systems reviewed and are  negative.    VITALS:   Last menstrual period 03/25/2023.  Wt Readings from Last 3 Encounters:  05/28/23 248 lb 9.6 oz (112.8 kg)  05/26/23 248 lb (112.5 kg)  05/20/23 243 lb 8 oz (110.5 kg)    There is no height or weight on file to calculate BMI.  Performance status (ECOG): 1 - Symptomatic but completely ambulatory  PHYSICAL EXAM:   Physical Exam Vitals and nursing note reviewed. Exam conducted with a chaperone present.  Constitutional:      Appearance: Normal appearance.  Cardiovascular:     Rate and Rhythm: Normal rate and regular rhythm.     Pulses: Normal pulses.     Heart sounds: Normal heart sounds.  Pulmonary:     Effort: Pulmonary effort is normal.     Breath sounds: Normal breath sounds.  Abdominal:     Palpations: Abdomen is soft. There is no hepatomegaly, splenomegaly or mass.     Tenderness: There is no abdominal tenderness.  Musculoskeletal:     Right lower leg: No edema.     Left lower leg: No edema.  Lymphadenopathy:     Cervical: No cervical adenopathy.     Right cervical: No superficial, deep or posterior cervical adenopathy.    Left cervical: No superficial, deep or posterior cervical adenopathy.     Upper Body:     Right upper body: No supraclavicular or axillary adenopathy.     Left upper body: No supraclavicular or axillary adenopathy.  Neurological:     General: No focal deficit present.     Mental Status: She is alert and oriented to person, place, and time.  Psychiatric:        Mood and Affect: Mood normal.  Behavior: Behavior normal.     LABS:   CBC     Component Value Date/Time   WBC 4.6 05/28/2023 1224   RBC 4.64 05/28/2023 1224   HGB 13.3 06/02/2023 1553   HGB 12.9 06/02/2023 1553   HGB 11.7 09/16/2022 0820   HCT 39.0 06/02/2023 1553   HCT 38.0 06/02/2023 1553   HCT 40.3 09/16/2022 0820   PLT 258 05/28/2023 1224   PLT 254 09/16/2022 0820   MCV 81.9 05/28/2023 1224   MCV 80 09/16/2022 0820   MCH 23.3 (L) 05/28/2023  1224   MCHC 28.4 (L) 05/28/2023 1224   RDW 29.2 (H) 05/28/2023 1224   RDW 20.7 (H) 09/16/2022 0820   LYMPHSABS 0.7 05/28/2023 1224   LYMPHSABS 1.0 09/16/2022 0820   MONOABS 0.4 05/28/2023 1224   EOSABS 0.2 05/28/2023 1224   EOSABS 0.2 09/16/2022 0820   BASOSABS 0.1 05/28/2023 1224   BASOSABS 0.1 09/16/2022 0820    CMP      Component Value Date/Time   NA 138 06/02/2023 1553   NA 139 06/02/2023 1553   NA 138 09/16/2022 0820   K 4.0 06/02/2023 1553   K 3.8 06/02/2023 1553   CL 98 05/28/2023 1224   CO2 30 05/28/2023 1224   GLUCOSE 90 05/28/2023 1224   BUN 14 05/28/2023 1224   BUN 22 09/16/2022 0820   CREATININE 0.99 05/28/2023 1224   CREATININE 0.74 01/31/2017 1119   CALCIUM  9.7 05/28/2023 1224   PROT 9.0 (H) 05/28/2023 1224   PROT 8.0 09/16/2022 0816   ALBUMIN 3.3 (L) 05/28/2023 1224   ALBUMIN 4.1 09/16/2022 0816   AST 34 05/28/2023 1224   ALT 25 05/28/2023 1224   ALKPHOS 112 05/28/2023 1224   BILITOT 1.6 (H) 05/28/2023 1224   BILITOT 0.6 09/16/2022 0816   GFRNONAA >60 05/28/2023 1224   GFRAA >60 04/13/2019 1344     No results found for: "CEA1", "CEA" / No results found for: "CEA1", "CEA" No results found for: "PSA1" No results found for: "WUJ811" No results found for: "CAN125"  No results found for: "TOTALPROTELP", "ALBUMINELP", "A1GS", "A2GS", "BETS", "BETA2SER", "GAMS", "MSPIKE", "SPEI" Lab Results  Component Value Date   TIBC 468 (H) 05/08/2023   TIBC 460 (H) 09/16/2022   TIBC 459 (H) 01/24/2022   FERRITIN 16 05/08/2023   FERRITIN 40 09/16/2022   FERRITIN 43 01/24/2022   IRONPCTSAT 4 (L) 05/08/2023   IRONPCTSAT 6 (LL) 09/16/2022   IRONPCTSAT 6 (L) 01/24/2022   Lab Results  Component Value Date   LDH 133 12/31/2022   LDH 218 (H) 01/24/2022   LDH 127 04/13/2019     STUDIES:   CARDIAC CATHETERIZATION Result Date: 06/02/2023 Findings: On milrinone  0.25 RA = 10 RV = 70/11 PA = 76/31 (50) PCW = 33 (v = 50) Fick cardiac output/index = 7.7/3.4 Thermo  CO/CI = 7.1/3.1 PVR = 2.3 (Fick) 2.6 (TD) Ao sat = 95% PA sat = 68% PAPi = 4.5 Assessment: 1. Severe pulmonary venous HTN 2. Severely elevated PCWP with prominent v-waves in setting of known severe MR 3. Preserved CO Plan/Discussion: Filling pressures markedly elevated. Suggested admit for IV diuresis but she would like to try to diurese at home. Will add metolazone  2.5 for several days. Knows to come in or call 911 if more dyspneic. Will d/w Structural Team for possible mTEER. Jules Oar, MD 4:29 PM  ECHO TEE Result Date: 06/02/2023    TRANSESOPHOGEAL ECHO REPORT   Patient Name:   [name] [name] Date of  Exam: 06/02/2023 Medical Rec #:  [id number]       Height:       70.0 in Accession #:    [id number]      Weight:       248.6 lb Date of Birth:  [birth year]       BSA:          2.289 m Patient Age:    47 years        BP:           126/82 mmHg Patient Gender: F               HR:           83 bpm. Exam Location:  Inpatient Procedure: Transesophageal Echo, 3D Echo, Cardiac Doppler and Color Doppler            (Both Spectral and Color Flow Doppler were utilized during            procedure). Indications:     Mitral valve insufficiency, unspecified etiology [I34.0]  History:         Patient has prior history of Echocardiogram examinations, most                  recent 05/16/2023. CHF and Cardiomyopathy, CAD, Mitral Valve                  Disease; Risk Factors:Hypertension and Sleep Apnea.  Sonographer:     Astrid Blamer Referring Phys:  [id number] DANIEL R BENSIMHON Diagnosing Phys: Jules Oar MD PROCEDURE: After discussion of the risks and benefits of a TEE, an informed consent was obtained from the patient. The transesophogeal probe was passed without difficulty through the esophogus of the patient. Sedation performed by different physician. The patient was monitored while under deep sedation. Anesthestetic sedation was provided intravenously by Anesthesiology: 160mg  of Propofol . The patient developed no  complications during the procedure.  IMPRESSIONS  1. Left ventricular ejection fraction, by estimation, is 40 to 45%. The left ventricle has mildly decreased function.  2. Right ventricular systolic function is moderately reduced. The right ventricular size is normal.  3. Left atrial size was severely dilated. No left atrial/left atrial appendage thrombus was detected.  4. Right atrial size was severely dilated.  5. Posterior leaflet restriction with severe posterior MR and systolic flow reversal in pulmonary veins. PISA 1.3 cm. MR volume estimated at 93ml with ERO 0.82cm2. The mitral valve is abnormal. Severe mitral valve regurgitation.  6. The aortic valve is normal in structure. Aortic valve regurgitation is not visualized. FINDINGS  Left Ventricle: Left ventricular ejection fraction, by estimation, is 40 to 45%. The left ventricle has mildly decreased function. The left ventricular internal cavity size was normal in size. Right Ventricle: The right ventricular size is normal. Right vetricular wall thickness was not well visualized. Right ventricular systolic function is moderately reduced. Left Atrium: Left atrial size was severely dilated. No left atrial/left atrial appendage thrombus was detected. Right Atrium: Right atrial size was severely dilated. Pericardium: There is no evidence of pericardial effusion. Mitral Valve: Posterior leaflet restriction with severe posterior MR and systolic flow reversal in pulmonary veins. PISA 1.3 cm. MR volume estimated at 93ml with ERO 0.82cm2. The mitral valve is abnormal. Severe mitral valve regurgitation, with posteriorly-directed jet. Tricuspid Valve: The tricuspid valve is normal in structure. Tricuspid valve regurgitation is mild. Aortic Valve: The aortic valve is normal in structure. Aortic valve regurgitation is not visualized. Pulmonic Valve: The pulmonic valve was  normal in structure. Pulmonic valve regurgitation is trivial. Aorta: The aortic root is normal in size  and structure. IAS/Shunts: No atrial level shunt detected by color flow Doppler. Jules Oar MD Electronically signed by Jules Oar MD Signature Date/Time: 06/02/2023/2:30:31 PM    Final    EP STUDY Result Date: 06/02/2023 See surgical note for result.  ECHOCARDIOGRAM COMPLETE Result Date: 05/16/2023    ECHOCARDIOGRAM REPORT   Patient Name:   [name] [name] Date of Exam: 05/16/2023 Medical Rec #:  [id number]       Height:       70.0 in Accession #:    [id number]      Weight:       244.5 lb Date of Birth:  20-Nov-1975       BSA:          2.273 m Patient Age:    47 years        BP:           129/83 mmHg Patient Gender: F               HR:           88 bpm. Exam Location:  Inpatient Procedure: 2D Echo, Cardiac Doppler and Color Doppler (Both Spectral and Color            Flow Doppler were utilized during procedure). Indications:    Mitral insufficiency I34.0  History:        Patient has prior history of Echocardiogram examinations, most                 recent 05/07/2023. CHF, Signs/Symptoms:Syncope; Risk                 Factors:Hypertension, Sleep Apnea and Current Smoker.  Sonographer:    Terrilee Few RCS Referring Phys: [phone] LINDSAY NICOLE [facility] IMPRESSIONS  1. Left ventricular ejection fraction, by estimation, is 25 to 30%. The left ventricle has severely decreased function. The left ventricle demonstrates global hypokinesis. The left ventricular internal cavity size was severely dilated. Left ventricular diastolic parameters are indeterminate.  2. Right ventricular systolic function is moderately reduced. The right ventricular size is moderately enlarged.  3. Left atrial size was severely dilated.  4. Right atrial size was moderately dilated.  5. The posterior leaflet is mildly restrictd with moderate to severe posterior MR (suspect severe).. The mitral valve is normal in structure. Mild to moderate mitral valve regurgitation. No evidence of mitral stenosis.  6. The aortic valve is normal in  structure. Aortic valve regurgitation is trivial. No aortic stenosis is present.  7. The inferior vena cava is dilated in size with <50% respiratory variability, suggesting right atrial pressure of 15 mmHg. FINDINGS  Left Ventricle: Left ventricular ejection fraction, by estimation, is 25 to 30%. The left ventricle has severely decreased function. The left ventricle demonstrates global hypokinesis. The left ventricular internal cavity size was severely dilated. There is no left ventricular hypertrophy. Left ventricular diastolic parameters are indeterminate. Right Ventricle: The right ventricular size is moderately enlarged. No increase in right ventricular wall thickness. Right ventricular systolic function is moderately reduced. Left Atrium: Left atrial size was severely dilated. Right Atrium: Right atrial size was moderately dilated. Pericardium: There is no evidence of pericardial effusion. Mitral Valve: The posterior leaflet is mildly restrictd with moderate to severe posterior MR (suspect severe). The mitral valve is normal in structure. Mild to moderate mitral valve regurgitation, with posteriorly-directed jet. No evidence of mitral valve stenosis. Tricuspid Valve:  The tricuspid valve is normal in structure. Tricuspid valve regurgitation is mild . No evidence of tricuspid stenosis. Aortic Valve: The aortic valve is normal in structure. Aortic valve regurgitation is trivial. No aortic stenosis is present. Aortic valve peak gradient measures 9.1 mmHg. Pulmonic Valve: The pulmonic valve was normal in structure. Pulmonic valve regurgitation is trivial. No evidence of pulmonic stenosis. Aorta: The aortic root is normal in size and structure. Venous: The inferior vena cava is dilated in size with less than 50% respiratory variability, suggesting right atrial pressure of 15 mmHg. IAS/Shunts: There is right bowing of the interatrial septum, suggestive of elevated left atrial pressure. No atrial level shunt detected  by color flow Doppler.  LEFT VENTRICLE PLAX 2D LVIDd:         7.80 cm      Diastology LVIDs:         6.80 cm      LV e' medial:    9.36 cm/s LV PW:         1.00 cm      LV E/e' medial:  14.3 LV IVS:        0.70 cm      LV e' lateral:   17.60 cm/s LVOT diam:     2.30 cm      LV E/e' lateral: 7.6 LV SV:         71 LV SV Index:   31 LVOT Area:     4.15 cm  LV Volumes (MOD) LV vol d, MOD A2C: 330.0 ml LV vol d, MOD A4C: 271.0 ml LV vol s, MOD A2C: 240.0 ml LV vol s, MOD A4C: 188.0 ml LV SV MOD A2C:     90.0 ml LV SV MOD A4C:     271.0 ml LV SV MOD BP:      95.9 ml RIGHT VENTRICLE             IVC RV S prime:     11.00 cm/s  IVC diam: 2.70 cm TAPSE (M-mode): 1.6 cm LEFT ATRIUM              Index        RIGHT ATRIUM           Index LA diam:        5.60 cm  2.46 cm/m   RA Area:     20.10 cm LA Vol (A2C):   86.1 ml  37.87 ml/m  RA Volume:   56.55 ml  24.88 ml/m LA Vol (A4C):   133.0 ml 58.50 ml/m LA Biplane Vol: 116.0 ml 51.03 ml/m  AORTIC VALVE AV Area (Vmax): 2.92 cm AV Vmax:        151.00 cm/s AV Peak Grad:   9.1 mmHg LVOT Vmax:      106.00 cm/s LVOT Vmean:     70.800 cm/s LVOT VTI:       0.171 m  AORTA Ao Root diam: 2.60 cm Ao Asc diam:  2.80 cm MITRAL VALVE MV Area (PHT): 4.89 cm     SHUNTS MV Decel Time: 155 msec     Systemic VTI:  0.17 m MV E velocity: 134.00 cm/s  Systemic Diam: 2.30 cm MV A velocity: 52.50 cm/s MV E/A ratio:  2.55 Jules Oar MD Electronically signed by Jules Oar MD Signature Date/Time: 05/16/2023/11:09:51 AM    Final    IR Fluoro Guide CV Line Right Result Date: 05/16/2023 INDICATION: 48 year old with acute on chronic heart failure. Patient needs a tunneled central line  for home Milrinone  infusion. EXAM: FLUOROSCOPIC AND ULTRASOUND GUIDED PLACEMENT OF A TUNNELED CENTRAL VENOUS CATHETER Physician: Olive Better. Julietta Ogren, MD FLUOROSCOPY TIME:  Radiation Exposure Index (as provided by the fluoroscopic device): 8 mGy Kerma MEDICATIONS: Zofran  4 mg IV ANESTHESIA/SEDATION: None PROCEDURE:  The procedure was explained to the patient. The risks and benefits of the procedure were discussed and the patient's questions were addressed. Informed consent was obtained from the patient. The patient was placed supine on the interventional table. Ultrasound confirmed a patent right internal jugularvein. Ultrasound images were obtained for documentation. The right neck and chest was prepped and draped in a sterile fashion. Maximal barrier sterile technique was utilized including caps, mask, sterile gowns, sterile gloves, sterile drape, hand hygiene and skin antiseptic. The right neck was anesthetized with 1% lidocaine . A small incision was made with #11 blade scalpel. A 21 gauge needle directed into the right internal jugular vein with ultrasound guidance. A micropuncture dilator set was placed. At this point, the patient had nausea and emesis. Patient needed to sit up. Therefore, the micropuncture catheter was removed and a bandage was placed. Patient was given Zofran . After approximately 15 minutes, the patient's symptoms resolved. Right neck and chest was re-prepped and draped in sterile fashion. Using ultrasound guidance, a 21 gauge needle was directed into the right internal jugular vein and micropuncture dilator set was placed. Wire was placed for measurement purposes. A dual lumen Powerline catheter was selected because there were no single lumen Powerline catheters available. The skin below the right clavicle was anesthetized and a small incision was made with an #11 blade scalpel. A subcutaneous tunnel was formed to the vein dermatotomy site. The catheter was brought through the tunnel. The vein dermatotomy site was dilated to accommodate a peel-away sheath. The catheter was placed through the peel-away sheath and directed into the central venous structures. The tip of the catheter was placed at the superior cavoatrial junction with fluoroscopy. Fluoroscopic images were obtained for documentation. Both  lumens were found to aspirate and flush well. Lumens were flushed with saline. The vein dermatotomy site was closed using Dermabond. The catheter was secured to the skin using Prolene suture. FINDINGS: Catheter tip at the superior cavoatrial junction. Catheter length = 25 cm. COMPLICATIONS: None IMPRESSION: Successful placement of a right jugular tunneled central venous catheter using ultrasound and fluoroscopic guidance. Electronically Signed   By: Elene Griffes M.D.   On: 05/16/2023 11:04   IR US  Guide Vasc Access Right Result Date: 05/16/2023 INDICATION: 48 year old with acute on chronic heart failure. Patient needs a tunneled central line for home Milrinone  infusion. EXAM: FLUOROSCOPIC AND ULTRASOUND GUIDED PLACEMENT OF A TUNNELED CENTRAL VENOUS CATHETER Physician: Olive Better. Julietta Ogren, MD FLUOROSCOPY TIME:  Radiation Exposure Index (as provided by the fluoroscopic device): 8 mGy Kerma MEDICATIONS: Zofran  4 mg IV ANESTHESIA/SEDATION: None PROCEDURE: The procedure was explained to the patient. The risks and benefits of the procedure were discussed and the patient's questions were addressed. Informed consent was obtained from the patient. The patient was placed supine on the interventional table. Ultrasound confirmed a patent right internal jugularvein. Ultrasound images were obtained for documentation. The right neck and chest was prepped and draped in a sterile fashion. Maximal barrier sterile technique was utilized including caps, mask, sterile gowns, sterile gloves, sterile drape, hand hygiene and skin antiseptic. The right neck was anesthetized with 1% lidocaine . A small incision was made with #11 blade scalpel. A 21 gauge needle directed into the right internal jugular vein with ultrasound  guidance. A micropuncture dilator set was placed. At this point, the patient had nausea and emesis. Patient needed to sit up. Therefore, the micropuncture catheter was removed and a bandage was placed. Patient was given Zofran .  After approximately 15 minutes, the patient's symptoms resolved. Right neck and chest was re-prepped and draped in sterile fashion. Using ultrasound guidance, a 21 gauge needle was directed into the right internal jugular vein and micropuncture dilator set was placed. Wire was placed for measurement purposes. A dual lumen Powerline catheter was selected because there were no single lumen Powerline catheters available. The skin below the right clavicle was anesthetized and a small incision was made with an #11 blade scalpel. A subcutaneous tunnel was formed to the vein dermatotomy site. The catheter was brought through the tunnel. The vein dermatotomy site was dilated to accommodate a peel-away sheath. The catheter was placed through the peel-away sheath and directed into the central venous structures. The tip of the catheter was placed at the superior cavoatrial junction with fluoroscopy. Fluoroscopic images were obtained for documentation. Both lumens were found to aspirate and flush well. Lumens were flushed with saline. The vein dermatotomy site was closed using Dermabond. The catheter was secured to the skin using Prolene suture. FINDINGS: Catheter tip at the superior cavoatrial junction. Catheter length = 25 cm. COMPLICATIONS: None IMPRESSION: Successful placement of a right jugular tunneled central venous catheter using ultrasound and fluoroscopic guidance. Electronically Signed   By: Elene Griffes M.D.   On: 05/16/2023 11:04   CARDIAC CATHETERIZATION Result Date: 05/13/2023 HEMODYNAMICS: RA:       19 mmHg (mean) RV:       90/9, 19 mmHg PA:       90/38 mmHg (55 mean) PCWP: 27 mmHg (mean) with v waves to 45    Estimated Fick CO/CI   6.6L/min, 2.74L/min/m2 Thermodilution CO/CI   6.4L/min, 2.66L/min/m2    TPG  28  mmHg     PVR  4.375 Wood Units PAPi  2.73  IMPRESSION: Right heart catheterization for end stage cardiomyopathy on dobutamine  7.5. Severely elevated biventricular filling pressures. Normal cardiac  output by thermodilution on inotropic support. 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 RECOMMENDATIONS: Increase IV diuresis Transition to milrinone  given severely elevated PA pressures Will need aggressive diuresis and consideration of home inotropes   DG CHEST PORT 1 VIEW Result Date: 05/13/2023 CLINICAL DATA:  Status post Swan-Ganz catheter placement EXAM: PORTABLE CHEST 1 VIEW COMPARISON:  05/12/2023 FINDINGS: New right jugular Swan-Ganz catheter is noted in the right pulmonary artery. Cardiac shadow remains enlarged. Right PICC is seen in satisfactory position. Central vascular congestion is noted increased when compared with the prior exam. IMPRESSION: Worsening central vascular congestion. New Swan-Ganz catheter as described.  No pneumothorax is noted. Electronically Signed   By: Violeta Grey M.D.   On: 05/13/2023 11:25   DG CHEST PORT 1 VIEW Result Date: 05/12/2023 CLINICAL DATA:  Central line EXAM: PORTABLE CHEST 1 VIEW COMPARISON:  05/12/2023 FINDINGS: Cardiomegaly with vascular congestion. Right upper extremity central venous catheter tip courses cranially with the tip positioned over the neck IMPRESSION: 1. Right upper extremity central venous catheter tip courses cranially with the tip positioned over the neck. Recommend repositioning. Subsequent chest x-ray has been performed following PICC repositioning 2. Cardiomegaly with vascular congestion. Electronically Signed   By: Esmeralda Hedge M.D.   On: 05/12/2023 20:16   DG CHEST PORT 1 VIEW Result Date: 05/12/2023 CLINICAL DATA:  Central line placement  EXAM: PORTABLE CHEST 1 VIEW COMPARISON:  05/12/2023, 05/06/2023 FINDINGS: Cardiomegaly with vascular congestion. No pleural effusion or pneumothorax. Right upper extremity central venous catheter with poorly visible tip, suspect that the tip is at the distal SVC. IMPRESSION: 1. Right upper extremity central venous catheter with poorly  visible tip, suspect that the tip is at the distal SVC. 2. Cardiomegaly with vascular congestion. Electronically Signed   By: Esmeralda Hedge M.D.   On: 05/12/2023 20:15   US  EKG SITE RITE Result Date: 05/12/2023 If Site Rite image not attached, placement could not be confirmed due to current cardiac rhythm.  DG CHEST PORT 1 VIEW Result Date: 05/12/2023 CLINICAL DATA:  Cough. EXAM: PORTABLE CHEST 1 VIEW COMPARISON:  Radiographs 05/06/2023 and 07/07/2022.  CT 10/10/2022. FINDINGS: 1427 hours. New right arm PICC projects cephalad into the neck, tip not visualized. Repositioning recommended. Stable cardiac enlargement and vascular congestion. There is no confluent airspace disease, pneumothorax or significant pleural effusion. The bones appear unchanged. Telemetry leads overlie the chest. IMPRESSION: 1. New right arm PICC projects cephalad into the neck, tip not visualized. Repositioning and follow-up radiographs recommended. 2. Stable cardiac enlargement and vascular congestion. No evidence of pneumonia. 3. These results will be called to the ordering clinician or representative by the Radiologist Assistant, and communication documented in the PACS or [employer]. Electronically Signed   By: Elmon Hagedorn M.D.   On: 05/12/2023 17:35

## 2023-06-10 ENCOUNTER — Telehealth: Payer: Self-pay

## 2023-06-10 NOTE — Addendum Note (Signed)
 Addended by: Eudora Guevarra A on: 06/10/2023 11:12 AM   Modules accepted: Orders, Level of Service

## 2023-06-10 NOTE — Telephone Encounter (Signed)
 Spoke with the [facility]. Rescheduled the patient for INR check on 5/29 at 1030, after Dr. Katheryne Pane consult.  Confirmed new plan with patient. She understood her 5/29 INR check in [place] is canceled and she will have INR checked at Dr. Katheryne Pane office after his visit.  She was grateful for call and agreed with plan.

## 2023-06-10 NOTE — Telephone Encounter (Signed)
 [name] [name]:   Fossa looks good for transseptal approach. Enough space within the LA to achieve straddle and manipulate the device down to the valve plane. 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 Posterior leaflet length measured >0.9cm. A large valve area was measured in the short axis transgastric at ~7.0cm2 and the gradient was not seen. Will assume the patient's gradient can tolerate at least 1 implant. Color on the transgastric showed MR expanding across the valve from the medial to lateral side of A2P2. Patient's regurgitant volume of 93ml also suggests higher probability for a 2-clip case. Plan on placing first clip on the medial side of A2P2, with a plan to place a second clip adjacent and just lateral to the first. Place first clip and see how the MR responds. XTW appropriate.

## 2023-06-10 NOTE — Telephone Encounter (Signed)
 This encounter was created in error - please disregard.

## 2023-06-11 LAB — COPPER, SERUM: Copper: 118 ug/dL (ref 80–158)

## 2023-06-12 LAB — METHYLMALONIC ACID, SERUM: Methylmalonic Acid, Quantitative: 740 nmol/L — ABNORMAL HIGH (ref 0–378)

## 2023-06-13 DIAGNOSIS — I5082 Biventricular heart failure: Secondary | ICD-10-CM | POA: Diagnosis not present

## 2023-06-13 DIAGNOSIS — I110 Hypertensive heart disease with heart failure: Secondary | ICD-10-CM | POA: Diagnosis not present

## 2023-06-13 DIAGNOSIS — M51369 Other intervertebral disc degeneration, lumbar region without mention of lumbar back pain or lower extremity pain: Secondary | ICD-10-CM | POA: Diagnosis not present

## 2023-06-13 DIAGNOSIS — I2510 Atherosclerotic heart disease of native coronary artery without angina pectoris: Secondary | ICD-10-CM | POA: Diagnosis not present

## 2023-06-13 DIAGNOSIS — M5441 Lumbago with sciatica, right side: Secondary | ICD-10-CM | POA: Diagnosis not present

## 2023-06-13 DIAGNOSIS — I5023 Acute on chronic systolic (congestive) heart failure: Secondary | ICD-10-CM | POA: Diagnosis not present

## 2023-06-13 DIAGNOSIS — R1310 Dysphagia, unspecified: Secondary | ICD-10-CM | POA: Diagnosis not present

## 2023-06-13 DIAGNOSIS — D509 Iron deficiency anemia, unspecified: Secondary | ICD-10-CM | POA: Diagnosis not present

## 2023-06-13 DIAGNOSIS — E876 Hypokalemia: Secondary | ICD-10-CM | POA: Diagnosis not present

## 2023-06-13 DIAGNOSIS — I4729 Other ventricular tachycardia: Secondary | ICD-10-CM | POA: Diagnosis not present

## 2023-06-13 DIAGNOSIS — I2721 Secondary pulmonary arterial hypertension: Secondary | ICD-10-CM | POA: Diagnosis not present

## 2023-06-13 DIAGNOSIS — I428 Other cardiomyopathies: Secondary | ICD-10-CM | POA: Diagnosis not present

## 2023-06-13 DIAGNOSIS — G4733 Obstructive sleep apnea (adult) (pediatric): Secondary | ICD-10-CM | POA: Diagnosis not present

## 2023-06-13 DIAGNOSIS — I051 Rheumatic mitral insufficiency: Secondary | ICD-10-CM | POA: Diagnosis not present

## 2023-06-13 DIAGNOSIS — M5410 Radiculopathy, site unspecified: Secondary | ICD-10-CM | POA: Diagnosis not present

## 2023-06-13 DIAGNOSIS — M47816 Spondylosis without myelopathy or radiculopathy, lumbar region: Secondary | ICD-10-CM | POA: Diagnosis not present

## 2023-06-13 DIAGNOSIS — F17210 Nicotine dependence, cigarettes, uncomplicated: Secondary | ICD-10-CM | POA: Diagnosis not present

## 2023-06-13 DIAGNOSIS — I5084 End stage heart failure: Secondary | ICD-10-CM | POA: Diagnosis not present

## 2023-06-13 DIAGNOSIS — G894 Chronic pain syndrome: Secondary | ICD-10-CM | POA: Diagnosis not present

## 2023-06-13 DIAGNOSIS — Z6835 Body mass index (BMI) 35.0-35.9, adult: Secondary | ICD-10-CM | POA: Diagnosis not present

## 2023-06-13 DIAGNOSIS — Z452 Encounter for adjustment and management of vascular access device: Secondary | ICD-10-CM | POA: Diagnosis not present

## 2023-06-13 DIAGNOSIS — I252 Old myocardial infarction: Secondary | ICD-10-CM | POA: Diagnosis not present

## 2023-06-13 DIAGNOSIS — M5442 Lumbago with sciatica, left side: Secondary | ICD-10-CM | POA: Diagnosis not present

## 2023-06-13 DIAGNOSIS — M48061 Spinal stenosis, lumbar region without neurogenic claudication: Secondary | ICD-10-CM | POA: Diagnosis not present

## 2023-06-13 DIAGNOSIS — I5043 Acute on chronic combined systolic (congestive) and diastolic (congestive) heart failure: Secondary | ICD-10-CM | POA: Diagnosis not present

## 2023-06-19 ENCOUNTER — Encounter

## 2023-06-19 ENCOUNTER — Ambulatory Visit: Attending: Cardiology | Admitting: *Deleted

## 2023-06-19 ENCOUNTER — Ambulatory Visit: Attending: Cardiovascular Disease | Admitting: Cardiovascular Disease

## 2023-06-19 ENCOUNTER — Encounter: Payer: Self-pay | Admitting: Cardiovascular Disease

## 2023-06-19 VITALS — BP 110/76 | HR 90 | Ht 70.0 in | Wt 247.0 lb

## 2023-06-19 DIAGNOSIS — Z86711 Personal history of pulmonary embolism: Secondary | ICD-10-CM | POA: Diagnosis not present

## 2023-06-19 DIAGNOSIS — Z5181 Encounter for therapeutic drug level monitoring: Secondary | ICD-10-CM

## 2023-06-19 DIAGNOSIS — I340 Nonrheumatic mitral (valve) insufficiency: Secondary | ICD-10-CM

## 2023-06-19 DIAGNOSIS — Z86718 Personal history of other venous thrombosis and embolism: Secondary | ICD-10-CM | POA: Diagnosis not present

## 2023-06-19 LAB — POCT INR: POC INR: 1.5

## 2023-06-19 NOTE — Patient Instructions (Signed)
 Description   Take 1 tablet of warfarin today and then continue to take warfarin 1 tablet daily except for 1/2 tablet on Sundays, Tuesdays and Thursdays On continuous IV Milrinone  now.  [facility] Recheck in 2 wk Potential LVAD in future

## 2023-06-19 NOTE — Progress Notes (Signed)
 [facility]   MULTIDISCIPLINARY HEART VALVE CLINIC  Patient ID: [name] [name] MRN: [id number] DOB/AGE: 1975-07-08 47 y.o.  Primary Care Physician:Grooms, Jearlean Mince, [place] Primary Cardiologist: Dr Bruce Caper  HPI: 48 year old woman with chronic HFrEF.  She is referred for consideration of transcatheter edge-to-edge repair of the mitral valve in the setting of her chronic heart failure.  The patient has a history of nonischemic cardiomyopathy and biventricular heart failure.  She has required inotropic support during hospitalizations for heart failure and volume overload.  She states that she has diuresed over 50 pounds in the last few months.  Her most recent hospitalization required discharge on home inotropic therapy with milrinone .  States that she is getting along pretty well with this and feeling significantly better energy levels.  She is still short of breath with activity and has occasional orthopnea.  Her leg edema has resolved.  She has no PND.  She complains of fatigue with activity and admits to [place]  Heart Association functional class III limitation.  The patient continues to smoke a few cigarettes on a daily basis.  Limitations to advanced therapies include her BMI and ongoing tobacco use.  RV dysfunction is a concern for her candidacy for VAD.  Transcatheter edge-to-edge repair of the mitral valve is felt to be her best treatment option at this point.  She recently underwent cardiac catheterization and TEE studies.  Her TEE showed moderate LV dysfunction with LVEF 40 to 45%, moderate RV dysfunction, severely dilated left atrium, annular dilatation and posterior leaflet restriction with severe posteriorly directed mitral regurgitation with systolic flow reversal in the pulmonary veins, Pisa radius of 1.3 cm and ERO of 0.8 cm.  Findings consistent with severe mitral regurgitation.  Cardiac catheterization demonstrated cardiac output and index of 7.1 and 3.1, respectively.   Wedge pressure is 33 with a V wave of 50 mmHg, and mean pulmonary artery pressure of 50 mmHg.  Past Medical History:  Diagnosis Date   Anemia    Blood transfusion without reported diagnosis    CAD (coronary artery disease)    Nonobstructive   Closed left ankle fracture    August 30 2012   Clotting disorder [facility])    DVT (deep venous thrombosis) (HCC)    L leg   Fibroid tumor    HFrEF (heart failure with reduced ejection fraction) (HCC)    Hidradenitis    Hypertension    Mitral regurgitation    Myocardial infarction (HCC)    NICM (nonischemic cardiomyopathy) (HCC)    NSVT (nonsustained ventricular tachycardia) (HCC)    Obesity    OSA on CPAP    Pulmonary embolism (HCC) 04/2013   Sleep apnea     Past Surgical History:  Procedure Laterality Date   ABSCESS DRAINAGE Bilateral 01/10/2015   BREAST BIOPSY Right 02/01/2020   benign   COLONOSCOPY WITH PROPOFOL  N/A 06/08/2018   Procedure: COLONOSCOPY WITH PROPOFOL ;  Surgeon: Suzette Espy, MD;  [facility];  Service: Endoscopy;  Laterality: N/A;  2:30pm   CYST REMOVAL TRUNK     IR FLUORO GUIDE CV LINE RIGHT  05/16/2023   IR US  GUIDE VASC ACCESS RIGHT  05/16/2023   IVC FILTER PLACEMENT ([facility] HX)     LOWER EXTREMITY VENOGRAPHY N/A 02/07/2021   Procedure: LOWER EXTREMITY VENOGRAPHY;  Surgeon: Kayla Part, MD;  Location: [facility] INVASIVE CV LAB;  Service: Cardiovascular;  Laterality: N/A;   PERIPHERAL VASCULAR THROMBECTOMY N/A 02/07/2021   Procedure: PERIPHERAL VASCULAR THROMBECTOMY;  Surgeon: Irvin Mantel  E, MD;  Location: [facility] INVASIVE CV LAB;  Service: Cardiovascular;  Laterality: N/A;   RIGHT HEART CATH N/A 03/08/2022   Procedure: RIGHT HEART CATH;  Surgeon: Alwin Baars, DO;  Location: [facility] INVASIVE CV LAB;  Service: Cardiovascular;  Laterality: N/A;   RIGHT HEART CATH N/A 10/04/2022   Procedure: RIGHT HEART CATH;  Surgeon: Alwin Baars, DO;  Location: [facility] INVASIVE CV LAB;  Service: Cardiovascular;  Laterality: N/A;    RIGHT HEART CATH N/A 05/13/2023   Procedure: RIGHT HEART CATH;  Surgeon: [name], MD;  Location: [facility] INVASIVE CV LAB;  Service: Cardiovascular;  Laterality: N/A;   RIGHT HEART CATH N/A 06/02/2023   Procedure: RIGHT HEART CATH;  Surgeon: Mardell Shade, MD;  Location: [facility] INVASIVE CV LAB;  Service: Cardiovascular;  Laterality: N/A;   RIGHT/LEFT HEART CATH AND CORONARY ANGIOGRAPHY N/A 06/19/2021   Procedure: RIGHT/LEFT HEART CATH AND CORONARY ANGIOGRAPHY;  Surgeon: Arty Binning, MD;  Location: [facility] INVASIVE CV LAB;  Service: Cardiovascular;  Laterality: N/A;   TEE WITHOUT CARDIOVERSION N/A 03/08/2022   Procedure: TRANSESOPHAGEAL ECHOCARDIOGRAM (TEE);  Surgeon: Alwin Baars, DO;  Location: [facility] ENDOSCOPY;  Service: Cardiovascular;  Laterality: N/A;   TRANSESOPHAGEAL ECHOCARDIOGRAM (CATH LAB) N/A 06/02/2023   Procedure: TRANSESOPHAGEAL ECHOCARDIOGRAM;  Surgeon: Mardell Shade, MD;  Location: [facility] INVASIVE CV LAB;  Service: Cardiovascular;  Laterality: N/A;    Family History  Problem Relation Age of Onset   Hypertension Mother    Diabetes Paternal Aunt    Diabetes Paternal Uncle    Other Father        blood clots   Cancer Maternal Grandmother    Heart attack Paternal Grandmother    Colon cancer Neg Hx     Social History   Socioeconomic History   Marital status: Widowed    Spouse name: Not on file   Number of children: Not on file   Years of education: Not on file   Highest education level: Some college, no degree  Occupational History   Not on file  Tobacco Use   Smoking status: Every Day    Types: Cigarettes    Passive exposure: Never   Smokeless tobacco: Never   Tobacco comments:    Smokes 2-3 cigarettes daily as of 02/13/22  Vaping Use   Vaping status: Never Used  Substance and Sexual Activity   Alcohol  use: Yes    Alcohol /week: 2.0 standard drinks of alcohol     Types: 2 Cans of beer per week    Comment: twice a week   Drug use: No   Sexual activity: Not  Currently    Birth control/protection: Pill, None  Other Topics Concern   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    Has a 47 year old Aviauna.   Live with parents.   Was working 5 days a weeks.   Not working right now.    Attends church.    Social Drivers of Corporate investment banker Strain: Low Risk  (04/03/2023)   Overall Financial Resource Strain (CARDIA)    Difficulty of Paying Living Expenses: Not very hard  Food Insecurity: No Food Insecurity (05/20/2023)   Hunger Vital Sign    Worried About Running Out of Food in the Last Year: Never true    Ran Out of Food in the Last Year: Never true  Transportation Needs: No Transportation Needs (05/20/2023)   PRAPARE - Transportation  Lack of Transportation (Medical): No    Lack of Transportation (Non-Medical): No  Physical Activity: Insufficiently Active (04/03/2023)   Exercise Vital Sign    Days of Exercise per Week: 2 days    Minutes of Exercise per Session: 10 min  Stress: No Stress Concern Present (04/03/2023)   [employer] of Occupational Health - Occupational Stress Questionnaire    Feeling of Stress : Only a little  Social Connections: Socially Isolated (04/03/2023)   Social Connection and Isolation Panel [NHANES]    Frequency of Communication with Friends and Family: Twice a week    Frequency of Social Gatherings with Friends and Family: Twice a week    Attends Religious Services: Never    Database administrator or Organizations: No    Attends Banker Meetings: Never    Marital Status: Widowed  Intimate Partner Violence: Not At Risk (05/20/2023)   Humiliation, Afraid, Rape, and Kick questionnaire    Fear of Current or Ex-Partner: No    Emotionally Abused: No    Physically Abused: No    Sexually Abused: No     Prior to Admission medications   Medication Sig Start Date End Date Taking? Authorizing Provider  albuterol  (VENTOLIN  HFA) 108 (90  Base) MCG/ACT inhaler Inhale 2 puffs into the lungs every 2 (two) hours as needed for wheezing or shortness of breath. 07/09/22  Yes Emokpae, Courage, MD  atorvastatin  (LIPITOR) 20 MG tablet TAKE ONE TABLET BY MOUTH ONCE DAILY. 04/30/23  Yes Branch, Joyceann No, MD  benzonatate  (TESSALON ) 100 MG capsule Take 1 capsule (100 mg total) by mouth 3 (three) times daily as needed for cough. Do not take with alcohol  or while operating or driving heavy machinery 03/01/39  Yes Grooms, Wendell, PA-C  bumetanide  (BUMEX ) 2 MG tablet Take 1 tablet (2 mg total) by mouth daily. 05/16/23  Yes [name], [name], NP  digoxin  (LANOXIN ) 0.125 MG tablet Take 1 tablet (0.125 mg total) by mouth daily. 05/17/23  Yes [name], [name], NP  milrinone  (PRIMACOR ) 20 MG/100 ML SOLN infusion Inject 0.0318 mg/min into the vein continuous. 05/16/23  Yes [name], [name], NP  potassium chloride  SA (KLOR-CON  M) 20 MEQ tablet Take 2 tablets (40 mEq total) by mouth 2 (two) times daily. 05/16/23  Yes [name], [name], NP  spironolactone  (ALDACTONE ) 25 MG tablet Take 2 tablets (50 mg total) by mouth daily. 05/16/23  Yes [name], [name], NP  warfarin (COUMADIN ) 10 MG tablet Take 10 mg (1 tablet) by mouth daily Monday through Friday, then take 5 mg (1/2 tablet) by mouth on Saturday and on Sunday. 05/16/23  Yes [name], [name], NP    Allergies  Allergen Reactions   Sulfa  Antibiotics Itching, Swelling and Rash    Lip swelling    ROS:  General: no fevers/chills/night sweats Eyes: no blurry vision, diplopia, or amaurosis ENT: no sore throat or hearing loss Resp: no cough, wheezing, or hemoptysis CV: no edema or palpitations GI: no abdominal pain, nausea, vomiting, diarrhea, or constipation GU: no dysuria, frequency, or hematuria Skin: no rash Neuro: no headache, numbness, tingling, or weakness of extremities Musculoskeletal: no joint pain or swelling Heme: no bleeding, DVT, or easy bruising Endo: no polydipsia or polyuria  BP 110/76   Pulse 90   Ht 5\' 10"  (1.778 m)    Wt 247 lb (112 kg)   SpO2 97%   BMI 35.44 kg/m   PHYSICAL EXAM: Pt is alert and oriented, WD, WN, pleasant obese woman in no distress. HEENT: normal Neck: JVP normal.  Carotid upstrokes normal without bruits. No thyromegaly. Lungs: equal expansion, clear bilaterally CV: Apex is diffuse, heart is regular rate and rhythm with a 3/6 holosystolic murmur at the apex and left axilla Abd: soft, NT, +BS, no bruit, no hepatosplenomegaly Back: no CVA tenderness Ext: no C/C/E        Femoral pulses 2+= without bruits        DP/PT pulses intact and = Skin: warm and dry without rash Neuro: CNII-XII intact             Strength intact = bilaterally    2D ECHO:  1. Left ventricular ejection fraction, by estimation, is 25 to 30%. The  left ventricle has severely decreased function. The left ventricle  demonstrates global hypokinesis. The left ventricular internal cavity size  was severely dilated. Left ventricular  diastolic parameters are indeterminate.   2. Right ventricular systolic function is moderately reduced. The right  ventricular size is moderately enlarged.   3. Left atrial size was severely dilated.   4. Right atrial size was moderately dilated.   5. The posterior leaflet is mildly restrictd with moderate to severe  posterior MR (suspect severe).. The mitral valve is normal in structure.  Mild to moderate mitral valve regurgitation. No evidence of mitral  stenosis.   6. The aortic valve is normal in structure. Aortic valve regurgitation is  trivial. No aortic stenosis is present.   7. The inferior vena cava is dilated in size with <50% respiratory  variability, suggesting right atrial pressure of 15 mmHg.   TEE: 1. Left ventricular ejection fraction, by estimation, is 40 to 45%. The  left ventricle has mildly decreased function.   2. Right ventricular systolic function is moderately reduced. The right  ventricular size is normal.   3. Left atrial size was severely dilated. No  left atrial/left atrial  appendage thrombus was detected.   4. Right atrial size was severely dilated.   5. Posterior leaflet restriction with severe posterior MR and systolic  flow reversal in pulmonary veins. PISA 1.3 cm. MR volume estimated at 93ml  with ERO 0.82cm2. The mitral valve is abnormal. Severe mitral valve  regurgitation.   6. The aortic valve is normal in structure. Aortic valve regurgitation is  not visualized.   CARDIAC CATH: Findings:   On milrinone  0.25   RA = 10 RV = 70/11 PA = 76/31 (50) PCW = 33 (v = 50) Fick cardiac output/index = 7.7/3.4 Thermo CO/CI = 7.1/3.1 PVR = 2.3 (Fick) 2.6 (TD) Ao sat = 95% PA sat = 68% PAPi = 4.5   Assessment: 1. Severe pulmonary venous HTN 2. Severely elevated PCWP with prominent v-waves in setting of known severe MR 3. Preserved CO   Plan/Discussion:   Filling pressures markedly elevated. Suggested admit for IV diuresis but she would like to try to diurese at home. Will add metolazone  2.5 for several days. Knows to come in or call 911 if more dyspneic.   Will d/w Structural Team for possible mTEER.  ASSESSMENT AND PLAN: 48 year old woman with advanced biventricular heart failure and severe nonrheumatic mitral regurgitation secondary to annular dilatation and posterior leaflet restriction (Carpentier type I and IIIb dysfunction).  The patient has NYHA Functional Class 4 symptoms of chronic systolic heart failure with severe, stage D1, mitral regurgitation, symptomatically improved on milrinone  with excellent diuresis now appears euvolemic.   I have reviewed the natural history of mitral regurgitation with the patient and their family members who are present today. We have  discussed the limitations of medical therapy and the poor prognosis associated with symptomatic mitral regurgitation. We have also reviewed potential treatment options, including palliative medical therapy, conventional surgical mitral valve repair or  replacement, and percutaneous mitral valve therapies such as edge-to-edge mitral valve approximation with MitraClip. We discussed treatment options in the context of this patient's specific comorbid medical conditions.  I reviewed the notes of the advanced heart failure team and she is not felt to have good options for advanced therapies at this point due to her severe RV dysfunction, obesity, and ongoing cigarette use.  The patient is counseled specifically about the risks, indications, and alternatives to percutaneous mitral valve repair with MitraClip. Specific risks include vascular injury, bleeding, infection, arrhythmia, myocardial infarction, stroke, cardiac perforation, cardiac tamponade, device embolization, single leaflet detachment, endocarditis, mitral valve injury, emergency surgery, and death. They understand the risk of serious complication occurs at a rate of approximately 2-3%.  She understands that she is at increased risk of complication related to her advanced heart failure, severe cardiomyopathy, and presence of IVC filter.  She understands that we would exercise great caution navigating her IVC filter with the transseptal dilator as well as the steerable guide catheter. Due to the broad MR jet, I suspect she will require 2 clip devices.  After this discussion, the patient provides full informed consent for surgery.   Arnoldo Lapping, MD  06/19/2023 9:45 AM    [facility] [address] [place], [place], [place]  [address] Phone: [phone]; Fax: [phone]

## 2023-06-19 NOTE — H&P (View-Only) (Signed)
 [facility]   MULTIDISCIPLINARY HEART VALVE CLINIC  Patient ID: [name] [name] MRN: [id number] DOB/AGE: 1975-07-08 47 y.o.  Primary Care Physician:Grooms, Jearlean Mince, [place] Primary Cardiologist: Dr Bruce Caper  HPI: 48 year old woman with chronic HFrEF.  She is referred for consideration of transcatheter edge-to-edge repair of the mitral valve in the setting of her chronic heart failure.  The patient has a history of nonischemic cardiomyopathy and biventricular heart failure.  She has required inotropic support during hospitalizations for heart failure and volume overload.  She states that she has diuresed over 50 pounds in the last few months.  Her most recent hospitalization required discharge on home inotropic therapy with milrinone .  States that she is getting along pretty well with this and feeling significantly better energy levels.  She is still short of breath with activity and has occasional orthopnea.  Her leg edema has resolved.  She has no PND.  She complains of fatigue with activity and admits to [place]  Heart Association functional class III limitation.  The patient continues to smoke a few cigarettes on a daily basis.  Limitations to advanced therapies include her BMI and ongoing tobacco use.  RV dysfunction is a concern for her candidacy for VAD.  Transcatheter edge-to-edge repair of the mitral valve is felt to be her best treatment option at this point.  She recently underwent cardiac catheterization and TEE studies.  Her TEE showed moderate LV dysfunction with LVEF 40 to 45%, moderate RV dysfunction, severely dilated left atrium, annular dilatation and posterior leaflet restriction with severe posteriorly directed mitral regurgitation with systolic flow reversal in the pulmonary veins, Pisa radius of 1.3 cm and ERO of 0.8 cm.  Findings consistent with severe mitral regurgitation.  Cardiac catheterization demonstrated cardiac output and index of 7.1 and 3.1, respectively.   Wedge pressure is 33 with a V wave of 50 mmHg, and mean pulmonary artery pressure of 50 mmHg.  Past Medical History:  Diagnosis Date   Anemia    Blood transfusion without reported diagnosis    CAD (coronary artery disease)    Nonobstructive   Closed left ankle fracture    August 30 2012   Clotting disorder [facility])    DVT (deep venous thrombosis) (HCC)    L leg   Fibroid tumor    HFrEF (heart failure with reduced ejection fraction) (HCC)    Hidradenitis    Hypertension    Mitral regurgitation    Myocardial infarction (HCC)    NICM (nonischemic cardiomyopathy) (HCC)    NSVT (nonsustained ventricular tachycardia) (HCC)    Obesity    OSA on CPAP    Pulmonary embolism (HCC) 04/2013   Sleep apnea     Past Surgical History:  Procedure Laterality Date   ABSCESS DRAINAGE Bilateral 01/10/2015   BREAST BIOPSY Right 02/01/2020   benign   COLONOSCOPY WITH PROPOFOL  N/A 06/08/2018   Procedure: COLONOSCOPY WITH PROPOFOL ;  Surgeon: Suzette Espy, MD;  Location[facility];  Service: Endoscopy;  Laterality: N/A;  2:30pm   CYST REMOVAL TRUNK     IR FLUORO GUIDE CV LINE RIGHT  05/16/2023   IR US  GUIDE VASC ACCESS RIGHT  05/16/2023   IVC FILTER PLACEMENT ([facility] HX)     LOWER EXTREMITY VENOGRAPHY N/A 02/07/2021   Procedure: LOWER EXTREMITY VENOGRAPHY;  Surgeon: Kayla Part, MD;  Location: [facility] INVASIVE CV LAB;  Service: Cardiovascular;  Laterality: N/A;   PERIPHERAL VASCULAR THROMBECTOMY N/A 02/07/2021   Procedure: PERIPHERAL VASCULAR THROMBECTOMY;  Surgeon: Irvin Mantel  E, MD;  Location: [facility] INVASIVE CV LAB;  Service: Cardiovascular;  Laterality: N/A;   RIGHT HEART CATH N/A 03/08/2022   Procedure: RIGHT HEART CATH;  Surgeon: Alwin Baars, DO;  Location: [facility] INVASIVE CV LAB;  Service: Cardiovascular;  Laterality: N/A;   RIGHT HEART CATH N/A 10/04/2022   Procedure: RIGHT HEART CATH;  Surgeon: Alwin Baars, DO;  Location: [facility] INVASIVE CV LAB;  Service: Cardiovascular;  Laterality: N/A;    RIGHT HEART CATH N/A 05/13/2023   Procedure: RIGHT HEART CATH;  Surgeon: [name], MD;  Location: [facility] INVASIVE CV LAB;  Service: Cardiovascular;  Laterality: N/A;   RIGHT HEART CATH N/A 06/02/2023   Procedure: RIGHT HEART CATH;  Surgeon: Mardell Shade, MD;  Location: [facility] INVASIVE CV LAB;  Service: Cardiovascular;  Laterality: N/A;   RIGHT/LEFT HEART CATH AND CORONARY ANGIOGRAPHY N/A 06/19/2021   Procedure: RIGHT/LEFT HEART CATH AND CORONARY ANGIOGRAPHY;  Surgeon: Arty Binning, MD;  Location: [facility] INVASIVE CV LAB;  Service: Cardiovascular;  Laterality: N/A;   TEE WITHOUT CARDIOVERSION N/A 03/08/2022   Procedure: TRANSESOPHAGEAL ECHOCARDIOGRAM (TEE);  Surgeon: Alwin Baars, DO;  Location: [facility] ENDOS[facility];  Service: Cardiovascular;  Laterality: N/A;   TRANSESOPHAGEAL ECHOCARDIOGRAM (CATH LAB) N/A 06/02/2023   Procedure: TRANSESOPHAGEAL ECHOCARDIOGRAM;  Surgeon: Mardell Shade, MD;  Location: [facility] INVASIVE CV LAB;  Service: Cardiovascular;  Laterality: N/A;    Family History  Problem Relation Age of Onset   Hypertension Mother    Diabetes Paternal Aunt    Diabetes Paternal Uncle    Other Father        blood clots   Cancer Maternal Grandmother    Heart attack Paternal Grandmother    Colon cancer Neg Hx     Social History   Socioeconomic History   Marital status: Widowed    Spouse name: Not on file   Number of children: Not on file   Years of education: Not on file   Highest education level: Some college, no degree  Occupational History   Not on file  Tobacco Use   Smoking status: Every Day    Types: Cigarettes    Passive exposure: Never   Smokeless tobacco: Never   Tobacco comments:    Smokes 2-3 cigarettes daily as of 02/13/22  Vaping Use   Vaping status: Never Used  Substance and Sexual Activity   Alcohol  use: Yes    Alcohol /week: 2.0 standard drinks of alcohol     Types: 2 Cans of beer per week    Comment: twice a week   Drug use: No   Sexual activity: Not  Currently    Birth control/protection: Pill, None  Other Topics Concern   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    Has a 47 year old Aviauna.   Live with parents.   Was working 5 days a weeks.   Not working right now.    Attends church.    Social Drivers of Corporate investment banker Strain: Low Risk  (04/03/2023)   Overall Financial Resource Strain (CARDIA)    Difficulty of Paying Living Expenses: Not very hard  Food Insecurity: No Food Insecurity (05/20/2023)   Hunger Vital Sign    Worried About Running Out of Food in the Last Year: Never true    Ran Out of Food in the Last Year: Never true  Transportation Needs: No Transportation Needs (05/20/2023)   PRAPARE - Transportation  Lack of Transportation (Medical): No    Lack of Transportation (Non-Medical): No  Physical Activity: Insufficiently Active (04/03/2023)   Exercise Vital Sign    Days of Exercise per Week: 2 days    Minutes of Exercise per Session: 10 min  Stress: No Stress Concern Present (04/03/2023)   [employer] of Occupational Health - Occupational Stress Questionnaire    Feeling of Stress : Only a little  Social Connections: Socially Isolated (04/03/2023)   Social Connection and Isolation Panel [NHANES]    Frequency of Communication with Friends and Family: Twice a week    Frequency of Social Gatherings with Friends and Family: Twice a week    Attends Religious Services: Never    Database administrator or Organizations: No    Attends Banker Meetings: Never    Marital Status: Widowed  Intimate Partner Violence: Not At Risk (05/20/2023)   Humiliation, Afraid, Rape, and Kick questionnaire    Fear of Current or Ex-Partner: No    Emotionally Abused: No    Physically Abused: No    Sexually Abused: No     Prior to Admission medications   Medication Sig Start Date End Date Taking? Authorizing Provider  albuterol  (VENTOLIN  HFA) 108 (90  Base) MCG/ACT inhaler Inhale 2 puffs into the lungs every 2 (two) hours as needed for wheezing or shortness of breath. 07/09/22  Yes Emokpae, Courage, MD  atorvastatin  (LIPITOR) 20 MG tablet TAKE ONE TABLET BY MOUTH ONCE DAILY. 04/30/23  Yes Branch, Joyceann No, MD  benzonatate  (TESSALON ) 100 MG capsule Take 1 capsule (100 mg total) by mouth 3 (three) times daily as needed for cough. Do not take with alcohol  or while operating or driving heavy machinery 03/01/39  Yes Grooms, Wendell, PA-C  bumetanide  (BUMEX ) 2 MG tablet Take 1 tablet (2 mg total) by mouth daily. 05/16/23  Yes [name], [name], NP  digoxin  (LANOXIN ) 0.125 MG tablet Take 1 tablet (0.125 mg total) by mouth daily. 05/17/23  Yes [name], [name], NP  milrinone  (PRIMACOR ) 20 MG/100 ML SOLN infusion Inject 0.0318 mg/min into the vein continuous. 05/16/23  Yes [name], [name], NP  potassium chloride  SA (KLOR-CON  M) 20 MEQ tablet Take 2 tablets (40 mEq total) by mouth 2 (two) times daily. 05/16/23  Yes [name], [name], NP  spironolactone  (ALDACTONE ) 25 MG tablet Take 2 tablets (50 mg total) by mouth daily. 05/16/23  Yes [name], [name], NP  warfarin (COUMADIN ) 10 MG tablet Take 10 mg (1 tablet) by mouth daily Monday through Friday, then take 5 mg (1/2 tablet) by mouth on Saturday and on Sunday. 05/16/23  Yes [name], [name], NP    Allergies  Allergen Reactions   Sulfa  Antibiotics Itching, Swelling and Rash    Lip swelling    ROS:  General: no fevers/chills/night sweats Eyes: no blurry vision, diplopia, or amaurosis ENT: no sore throat or hearing loss Resp: no cough, wheezing, or hemoptysis CV: no edema or palpitations GI: no abdominal pain, nausea, vomiting, diarrhea, or constipation GU: no dysuria, frequency, or hematuria Skin: no rash Neuro: no headache, numbness, tingling, or weakness of extremities Musculoskeletal: no joint pain or swelling Heme: no bleeding, DVT, or easy bruising Endo: no polydipsia or polyuria  BP 110/76   Pulse 90   Ht 5\' 10"  (1.778 m)    Wt 247 lb (112 kg)   SpO2 97%   BMI 35.44 kg/m   PHYSICAL EXAM: Pt is alert and oriented, WD, WN, pleasant obese woman in no distress. HEENT: normal Neck: JVP normal.  Carotid upstrokes normal without bruits. No thyromegaly. Lungs: equal expansion, clear bilaterally CV: Apex is diffuse, heart is regular rate and rhythm with a 3/6 holosystolic murmur at the apex and left axilla Abd: soft, NT, +BS, no bruit, no hepatosplenomegaly Back: no CVA tenderness Ext: no C/C/E        Femoral pulses 2+= without bruits        DP/PT pulses intact and = Skin: warm and dry without rash Neuro: CNII-XII intact             Strength intact = bilaterally    2D ECHO:  1. Left ventricular ejection fraction, by estimation, is 25 to 30%. The  left ventricle has severely decreased function. The left ventricle  demonstrates global hypokinesis. The left ventricular internal cavity size  was severely dilated. Left ventricular  diastolic parameters are indeterminate.   2. Right ventricular systolic function is moderately reduced. The right  ventricular size is moderately enlarged.   3. Left atrial size was severely dilated.   4. Right atrial size was moderately dilated.   5. The posterior leaflet is mildly restrictd with moderate to severe  posterior MR (suspect severe).. The mitral valve is normal in structure.  Mild to moderate mitral valve regurgitation. No evidence of mitral  stenosis.   6. The aortic valve is normal in structure. Aortic valve regurgitation is  trivial. No aortic stenosis is present.   7. The inferior vena cava is dilated in size with <50% respiratory  variability, suggesting right atrial pressure of 15 mmHg.   TEE: 1. Left ventricular ejection fraction, by estimation, is 40 to 45%. The  left ventricle has mildly decreased function.   2. Right ventricular systolic function is moderately reduced. The right  ventricular size is normal.   3. Left atrial size was severely dilated. No  left atrial/left atrial  appendage thrombus was detected.   4. Right atrial size was severely dilated.   5. Posterior leaflet restriction with severe posterior MR and systolic  flow reversal in pulmonary veins. PISA 1.3 cm. MR volume estimated at 93ml  with ERO 0.82cm2. The mitral valve is abnormal. Severe mitral valve  regurgitation.   6. The aortic valve is normal in structure. Aortic valve regurgitation is  not visualized.   CARDIAC CATH: Findings:   On milrinone  0.25   RA = 10 RV = 70/11 PA = 76/31 (50) PCW = 33 (v = 50) Fick cardiac output/index = 7.7/3.4 Thermo CO/CI = 7.1/3.1 PVR = 2.3 (Fick) 2.6 (TD) Ao sat = 95% PA sat = 68% PAPi = 4.5   Assessment: 1. Severe pulmonary venous HTN 2. Severely elevated PCWP with prominent v-waves in setting of known severe MR 3. Preserved CO   Plan/Discussion:   Filling pressures markedly elevated. Suggested admit for IV diuresis but she would like to try to diurese at home. Will add metolazone  2.5 for several days. Knows to come in or call 911 if more dyspneic.   Will d/w Structural Team for possible mTEER.  ASSESSMENT AND PLAN: 48 year old woman with advanced biventricular heart failure and severe nonrheumatic mitral regurgitation secondary to annular dilatation and posterior leaflet restriction (Carpentier type I and IIIb dysfunction).  The patient has NYHA Functional Class 4 symptoms of chronic systolic heart failure with severe, stage D1, mitral regurgitation, symptomatically improved on milrinone  with excellent diuresis now appears euvolemic.   I have reviewed the natural history of mitral regurgitation with the patient and their family members who are present today. We have  discussed the limitations of medical therapy and the poor prognosis associated with symptomatic mitral regurgitation. We have also reviewed potential treatment options, including palliative medical therapy, conventional surgical mitral valve repair or  replacement, and percutaneous mitral valve therapies such as edge-to-edge mitral valve approximation with MitraClip. We discussed treatment options in the context of this patient's specific comorbid medical conditions.  I reviewed the notes of the advanced heart failure team and she is not felt to have good options for advanced therapies at this point due to her severe RV dysfunction, obesity, and ongoing cigarette use.  The patient is counseled specifically about the risks, indications, and alternatives to percutaneous mitral valve repair with MitraClip. Specific risks include vascular injury, bleeding, infection, arrhythmia, myocardial infarction, stroke, cardiac perforation, cardiac tamponade, device embolization, single leaflet detachment, endocarditis, mitral valve injury, emergency surgery, and death. They understand the risk of serious complication occurs at a rate of approximately 2-3%.  She understands that she is at increased risk of complication related to her advanced heart failure, severe cardiomyopathy, and presence of IVC filter.  She understands that we would exercise great caution navigating her IVC filter with the transseptal dilator as well as the steerable guide catheter. Due to the broad MR jet, I suspect she will require 2 clip devices.  After this discussion, the patient provides full informed consent for surgery.   Arnoldo Lapping, MD  06/19/2023 9:45 AM    [facility] [address] [place], [place], [place]  [address] Phone: [phone]; Fax: [phone]

## 2023-06-19 NOTE — Patient Instructions (Signed)
 You are scheduled for your Pre-Admission Testing (PAT) visit on 07/07/2023 at 8:40AM at [facility]. Please arrive by 8:25AM for check-in. Enter through [employer], Main Entrance A. Pass Admitting, and PAT is on the left. If you have any trouble finding it, there are plenty of volunteers to help. There are no restrictions for this visit.   Lab Work: (As detailed above) If you have labs (blood work) drawn today and your tests are completely normal, you will receive your results only by: MyChart Message (if you have MyChart) OR A paper copy in the mail If you have any lab test that is abnormal or we need to change your treatment, we will call you to review the results.  Testing/Procedures: Mitral Valve repair procedure 07/09/23  Follow-Up: At [facility], you and your health needs are our priority.  As part of our continuing mission to provide you with exceptional heart care, our providers are all part of one team.  This team includes your primary Cardiologist (physician) and Advanced Practice Providers or APPs (Physician Assistants and Nurse Practitioners) who all work together to provide you with the care you need, when you need it.  Your next appointment:   Structural Team will follow-up  Provider:   Arnoldo Lapping, MD

## 2023-06-20 DIAGNOSIS — I5023 Acute on chronic systolic (congestive) heart failure: Secondary | ICD-10-CM | POA: Diagnosis not present

## 2023-06-20 DIAGNOSIS — I5043 Acute on chronic combined systolic (congestive) and diastolic (congestive) heart failure: Secondary | ICD-10-CM | POA: Diagnosis not present

## 2023-06-22 NOTE — Progress Notes (Deleted)
 [facility] [address], [place] [address]   CLINIC:  Medical Oncology/Hematology  PCP:  Grooms, Alayne Hubert [address] [place] [address] [phone]   REASON FOR VISIT:  Follow-up for iron  deficiency anemia  CURRENT THERAPY: Intermittent IV iron   INTERVAL HISTORY:   [name] [name] 48 y.o. female returns for routine follow-up of iron  deficiency anemia.  She was last seen by [place] on 06/09/2023.  At today's visit, she reports feeling ***.  *** Bleeding *** Fatigue, pica *** Headaches, lightheadedness, syncope *** Chest pain, dyspnea on exertion *** Iron  pill?  She has ***% energy and ***% appetite. She endorses that she is maintaining a stable weight.   ASSESSMENT & PLAN:  1.  Iron  deficiency state: - Patient seen at the request of Jearlean Mince Grooms, PA-C - Iron  deficiency from menorrhagia (5/28 days, 3 days heavy bleeding) - Colonoscopy (06/08/2018): Nonbleeding internal hemorrhoids.  Otherwise normal exam. - Received 2 units PRBC on 03/03/2021 - Received Venofer  500 mg x 2 during hospitalization in April 2025 (Hgb down to 9.0 on 05/16/2023) - She is not taking any iron  supplements.  *** - Hematology labs (06/09/2023): Hgb 13.0/MCV 82.4 Ferritin 90, iron  saturation 21%, TIBC elevated at 538 Reticulocytes normal (1.1) Normal copper , folate, B12.  MMA elevated at 740.  *** - ***Rectal bleeding or melena ***.  *** Menorrhagia.  *** - *** Symptoms *** - PLAN: Since iron  and hemoglobin levels have normalized after IV iron  (given during hospital stay in April 2025), we will plan on repeat CBC/D with iron  panel and RTC in 2 months.  *** - *** Start iron  pill?  *** - Due to elevated MMA, recommend starting B12 500 mcg daily.  Will recheck B12/MMA at follow-up in 2 months.  2.  Unprovoked DVT and PE: - History of unprovoked saddle PE in 2015, IVC filter was on Xarelto  - History of recurrent unprovoked DVT and thrombectomy in 01/2021  after missing 2 doses of Coumadin  (Coumadin  preferred due to weight more than 250 pounds) - PLAN: Continue Coumadin , per cardiology follow-up.  3.  Social/family history: - OTHER PMH includes cardiomyopathy and chronic systolic heart failure with biventricular dysfunction, as well as severe mitral regurgitation - She lives at her home with her mother.  She is currently on disability.  Currently smokes about 4 cigarettes/day.  Used to smoke 1 pack/day for 6 years. - Paternal grandmother and paternal uncles had anemia.  Paternal first cousin has sickle cell disease.  Maternal grandmother had lung cancer.  PLAN SUMMARY: >> *** >> *** >> ***    REVIEW OF SYSTEMS: ***  Review of Systems - Oncology   PHYSICAL EXAM:  ECOG PERFORMANCE STATUS: {CHL ONC ECOG CB:[id number]} *** There were no vitals filed for this visit. There were no vitals filed for this visit. Physical Exam  PAST MEDICAL/SURGICAL HISTORY:  Past Medical History:  Diagnosis Date   Anemia    Blood transfusion without reported diagnosis    CAD (coronary artery disease)    Nonobstructive   Closed left ankle fracture    August 30 2012   Clotting disorder (HCC)    DVT (deep venous thrombosis) (HCC)    L leg   Fibroid tumor    HFrEF (heart failure with reduced ejection fraction) (HCC)    Hidradenitis    Hypertension    Mitral regurgitation    Myocardial infarction (HCC)    NICM (nonischemic cardiomyopathy) (HCC)    NSVT (nonsustained ventricular tachycardia) (HCC)  Obesity    OSA on CPAP    Pulmonary embolism (HCC) 04/2013   Sleep apnea    Past Surgical History:  Procedure Laterality Date   ABSCESS DRAINAGE Bilateral 01/10/2015   BREAST BIOPSY Right 02/01/2020   benign   COLONOSCOPY WITH PROPOFOL  N/A 06/08/2018   Procedure: COLONOSCOPY WITH PROPOFOL ;  Surgeon: Suzette Espy, MD;  [facility];  Service: Endoscopy;  Laterality: N/A;  2:30pm   CYST REMOVAL TRUNK     IR FLUORO GUIDE CV LINE RIGHT   05/16/2023   IR US  GUIDE VASC ACCESS RIGHT  05/16/2023   IVC FILTER PLACEMENT ([facility] HX)     LOWER EXTREMITY VENOGRAPHY N/A 02/07/2021   Procedure: LOWER EXTREMITY VENOGRAPHY;  Surgeon: Kayla Part, MD;  Location: [facility] INVASIVE CV LAB;  Service: Cardiovascular;  Laterality: N/A;   PERIPHERAL VASCULAR THROMBECTOMY N/A 02/07/2021   Procedure: PERIPHERAL VASCULAR THROMBECTOMY;  Surgeon: Kayla Part, MD;  Location: [facility] INVASIVE CV LAB;  Service: Cardiovascular;  Laterality: N/A;   RIGHT HEART CATH N/A 03/08/2022   Procedure: RIGHT HEART CATH;  Surgeon: Alwin Baars, DO;  Location: [facility] INVASIVE CV LAB;  Service: Cardiovascular;  Laterality: N/A;   RIGHT HEART CATH N/A 10/04/2022   Procedure: RIGHT HEART CATH;  Surgeon: Alwin Baars, DO;  Location: [facility] INVASIVE CV LAB;  Service: Cardiovascular;  Laterality: N/A;   RIGHT HEART CATH N/A 05/13/2023   Procedure: RIGHT HEART CATH;  Surgeon: [name], MD;  Location: [facility] INVASIVE CV LAB;  Service: Cardiovascular;  Laterality: N/A;   RIGHT HEART CATH N/A 06/02/2023   Procedure: RIGHT HEART CATH;  Surgeon: Mardell Shade, MD;  Location: [facility] INVASIVE CV LAB;  Service: Cardiovascular;  Laterality: N/A;   RIGHT/LEFT HEART CATH AND CORONARY ANGIOGRAPHY N/A 06/19/2021   Procedure: RIGHT/LEFT HEART CATH AND CORONARY ANGIOGRAPHY;  Surgeon: Arty Binning, MD;  Location: [facility] INVASIVE CV LAB;  Service: Cardiovascular;  Laterality: N/A;   TEE WITHOUT CARDIOVERSION N/A 03/08/2022   Procedure: TRANSESOPHAGEAL ECHOCARDIOGRAM (TEE);  Surgeon: Alwin Baars, DO;  Location: [facility];  Service: Cardiovascular;  Laterality: N/A;   TRANSESOPHAGEAL ECHOCARDIOGRAM (CATH LAB) N/A 06/02/2023   Procedure: TRANSESOPHAGEAL ECHOCARDIOGRAM;  Surgeon: Mardell Shade, MD;  Location: [facility] INVASIVE CV LAB;  Service: Cardiovascular;  Laterality: N/A;    SOCIAL HISTORY:  Social History   Socioeconomic History   Marital status: Widowed    Spouse name: Not on  file   Number of children: Not on file   Years of education: Not on file   Highest education level: Some college, no degree  Occupational History   Not on file  Tobacco Use   Smoking status: Every Day    Types: Cigarettes    Passive exposure: Never   Smokeless tobacco: Never   Tobacco comments:    Smokes 2-3 cigarettes daily as of 02/13/22  Vaping Use   Vaping status: Never Used  Substance and Sexual Activity   Alcohol  use: Yes    Alcohol /week: 2.0 standard drinks of alcohol     Types: 2 Cans of beer per week    Comment: twice a week   Drug use: No   Sexual activity: Not Currently    Birth control/protection: Pill, None  Other Topics Concern   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    Has a 47 year old Aviauna.   Live with parents.   Was working 5 days  a weeks.   Not working right now.    Attends church.    Social Drivers of Corporate investment banker Strain: Low Risk  (04/03/2023)   Overall Financial Resource Strain (CARDIA)    Difficulty of Paying Living Expenses: Not very hard  Food Insecurity: No Food Insecurity (05/20/2023)   Hunger Vital Sign    Worried About Running Out of Food in the Last Year: Never true    Ran Out of Food in the Last Year: Never true  Transportation Needs: No Transportation Needs (05/20/2023)   PRAPARE - Administrator, Civil Service (Medical): No    Lack of Transportation (Non-Medical): No  Physical Activity: Insufficiently Active (04/03/2023)   Exercise Vital Sign    Days of Exercise per Week: 2 days    Minutes of Exercise per Session: 10 min  Stress: No Stress Concern Present (04/03/2023)   [employer] of Occupational Health - Occupational Stress Questionnaire    Feeling of Stress : Only a little  Social Connections: Socially Isolated (04/03/2023)   Social Connection and Isolation Panel [NHANES]    Frequency of Communication with Friends and Family: Twice a week     Frequency of Social Gatherings with Friends and Family: Twice a week    Attends Religious Services: Never    Database administrator or Organizations: No    Attends Banker Meetings: Never    Marital Status: Widowed  Intimate Partner Violence: Not At Risk (05/20/2023)   Humiliation, Afraid, Rape, and Kick questionnaire    Fear of Current or Ex-Partner: No    Emotionally Abused: No    Physically Abused: No    Sexually Abused: No    FAMILY HISTORY:  Family History  Problem Relation Age of Onset   Hypertension Mother    Diabetes Paternal Aunt    Diabetes Paternal Uncle    Other Father        blood clots   Cancer Maternal Grandmother    Heart attack Paternal Grandmother    Colon cancer Neg Hx     CURRENT MEDICATIONS:  Outpatient Encounter Medications as of 06/23/2023  Medication Sig   albuterol  (VENTOLIN  HFA) 108 (90 Base) MCG/ACT inhaler Inhale 2 puffs into the lungs every 2 (two) hours as needed for wheezing or shortness of breath.   atorvastatin  (LIPITOR) 20 MG tablet TAKE ONE TABLET BY MOUTH ONCE DAILY.   benzonatate  (TESSALON ) 100 MG capsule Take 1 capsule (100 mg total) by mouth 3 (three) times daily as needed for cough. Do not take with alcohol  or while operating or driving heavy machinery   bumetanide  (BUMEX ) 2 MG tablet Take 1 tablet (2 mg total) by mouth daily.   digoxin  (LANOXIN ) 0.125 MG tablet Take 1 tablet (0.125 mg total) by mouth daily.   milrinone  (PRIMACOR ) 20 MG/100 ML SOLN infusion Inject 0.0318 mg/min into the vein continuous.   potassium chloride  SA (KLOR-CON  M) 20 MEQ tablet Take 2 tablets (40 mEq total) by mouth 2 (two) times daily.   spironolactone  (ALDACTONE ) 25 MG tablet Take 2 tablets (50 mg total) by mouth daily.   warfarin (COUMADIN ) 10 MG tablet Take 10 mg (1 tablet) by mouth daily Monday through Friday, then take 5 mg (1/2 tablet) by mouth on Saturday and on Sunday.   No facility-administered encounter medications on file as of 06/23/2023.     ALLERGIES:  Allergies  Allergen Reactions   Sulfa  Antibiotics Itching, Swelling and Rash    Lip swelling  LABORATORY DATA:  I have reviewed the labs as listed.  CBC    Component Value Date/Time   WBC 4.4 06/09/2023 0921   RBC 5.41 (H) 06/09/2023 0921   RBC 5.41 (H) 06/09/2023 0921   HGB 13.0 06/09/2023 0921   HGB 11.7 09/16/2022 0820   HCT 44.6 06/09/2023 0921   HCT 40.3 09/16/2022 0820   PLT 282 06/09/2023 0921   PLT 254 09/16/2022 0820   MCV 82.4 06/09/2023 0921   MCV 80 09/16/2022 0820   MCH 24.0 (L) 06/09/2023 0921   MCHC 29.1 (L) 06/09/2023 0921   RDW 27.6 (H) 06/09/2023 0921   RDW 20.7 (H) 09/16/2022 0820   LYMPHSABS 0.5 (L) 06/09/2023 0921   LYMPHSABS 1.0 09/16/2022 0820   MONOABS 0.3 06/09/2023 0921   EOSABS 0.1 06/09/2023 0921   EOSABS 0.2 09/16/2022 0820   BASOSABS 0.1 06/09/2023 0921   BASOSABS 0.1 09/16/2022 0820      Latest Ref Rng & Units 06/02/2023    3:53 PM 05/28/2023   12:24 PM 05/16/2023    4:08 AM  CMP  Glucose 70 - 99 mg/dL  90  829   BUN 6 - 20 mg/dL  14  21   Creatinine 5.62 - 1.00 mg/dL  1.30  8.65   Sodium 784 - 145 mmol/L 135 - 145 mmol/L 138    139  138  131   Potassium 3.5 - 5.1 mmol/L 3.5 - 5.1 mmol/L 4.0    3.8  5.1  3.5   Chloride 98 - 111 mmol/L  98  90   CO2 22 - 32 mmol/L  30  31   Calcium  8.9 - 10.3 mg/dL  9.7  8.8   Total Protein 6.5 - 8.1 g/dL  9.0    Total Bilirubin 0.0 - 1.2 mg/dL  1.6    Alkaline Phos 38 - 126 U/L  112    AST 15 - 41 U/L  34    ALT 0 - 44 U/L  25      DIAGNOSTIC IMAGING:  I have independently reviewed the relevant imaging and discussed with the patient.   WRAP UP:  All questions were answered. The patient knows to call the clinic with any problems, questions or concerns.  Medical decision making: ***  Time spent on visit: I spent *** minutes counseling the patient face to face. The total time spent in the appointment was *** minutes and more than 50% was on counseling.  Sonnie Dusky, PA-C  ***

## 2023-06-23 ENCOUNTER — Inpatient Hospital Stay: Attending: Physician Assistant | Admitting: Physician Assistant

## 2023-06-23 NOTE — Progress Notes (Signed)
 Discussed with Dr. Arlester Ladd. With history of DVT and IVC filter, the patient will need Lovenox  bridging. Will arrange with Coumadin  Clinic.

## 2023-06-23 NOTE — Progress Notes (Signed)
 STS SCORE  Procedure Type: Isolated MVR (replacement) Perioperative Outcome Estimate % Operative Mortality 0.921% Morbidity & Mortality 7.27% Stroke 0.445% Renal Failure 1.37% Reoperation 2.14% Prolonged Ventilation 4.87% Deep Sternal Wound Infection 0.113% Long Hospital Stay (>14 days) 3.8% Short Hospital Stay (<6 days)* 30%  Procedure Type: Isolated MVr (repair) Perioperative Outcome Estimate % Operative Mortality 0.991% Morbidity & Mortality 5.96% Stroke 0.383% Renal Failure 1.27% Reoperation 1.83% Prolonged Ventilation 4.04% Deep Sternal Wound Infection 0.114% Long Hospital Stay (>14 days) 2.2% Short Hospital Stay (<6 days)* 47.5%

## 2023-06-27 ENCOUNTER — Telehealth (HOSPITAL_COMMUNITY): Payer: Self-pay | Admitting: Cardiology

## 2023-06-27 ENCOUNTER — Ambulatory Visit: Admitting: Physician Assistant

## 2023-06-27 DIAGNOSIS — G4733 Obstructive sleep apnea (adult) (pediatric): Secondary | ICD-10-CM | POA: Diagnosis not present

## 2023-06-27 DIAGNOSIS — I110 Hypertensive heart disease with heart failure: Secondary | ICD-10-CM | POA: Diagnosis not present

## 2023-06-27 DIAGNOSIS — I051 Rheumatic mitral insufficiency: Secondary | ICD-10-CM | POA: Diagnosis not present

## 2023-06-27 DIAGNOSIS — I4729 Other ventricular tachycardia: Secondary | ICD-10-CM | POA: Diagnosis not present

## 2023-06-27 DIAGNOSIS — D509 Iron deficiency anemia, unspecified: Secondary | ICD-10-CM | POA: Diagnosis not present

## 2023-06-27 DIAGNOSIS — G894 Chronic pain syndrome: Secondary | ICD-10-CM | POA: Diagnosis not present

## 2023-06-27 DIAGNOSIS — M5442 Lumbago with sciatica, left side: Secondary | ICD-10-CM | POA: Diagnosis not present

## 2023-06-27 DIAGNOSIS — I428 Other cardiomyopathies: Secondary | ICD-10-CM | POA: Diagnosis not present

## 2023-06-27 DIAGNOSIS — M51369 Other intervertebral disc degeneration, lumbar region without mention of lumbar back pain or lower extremity pain: Secondary | ICD-10-CM | POA: Diagnosis not present

## 2023-06-27 DIAGNOSIS — I2510 Atherosclerotic heart disease of native coronary artery without angina pectoris: Secondary | ICD-10-CM | POA: Diagnosis not present

## 2023-06-27 DIAGNOSIS — I252 Old myocardial infarction: Secondary | ICD-10-CM | POA: Diagnosis not present

## 2023-06-27 DIAGNOSIS — Z452 Encounter for adjustment and management of vascular access device: Secondary | ICD-10-CM | POA: Diagnosis not present

## 2023-06-27 DIAGNOSIS — I5043 Acute on chronic combined systolic (congestive) and diastolic (congestive) heart failure: Secondary | ICD-10-CM | POA: Diagnosis not present

## 2023-06-27 DIAGNOSIS — M48061 Spinal stenosis, lumbar region without neurogenic claudication: Secondary | ICD-10-CM | POA: Diagnosis not present

## 2023-06-27 DIAGNOSIS — I5084 End stage heart failure: Secondary | ICD-10-CM | POA: Diagnosis not present

## 2023-06-27 DIAGNOSIS — I2721 Secondary pulmonary arterial hypertension: Secondary | ICD-10-CM | POA: Diagnosis not present

## 2023-06-27 DIAGNOSIS — R1310 Dysphagia, unspecified: Secondary | ICD-10-CM | POA: Diagnosis not present

## 2023-06-27 DIAGNOSIS — I5082 Biventricular heart failure: Secondary | ICD-10-CM | POA: Diagnosis not present

## 2023-06-27 DIAGNOSIS — F17210 Nicotine dependence, cigarettes, uncomplicated: Secondary | ICD-10-CM | POA: Diagnosis not present

## 2023-06-27 DIAGNOSIS — M5410 Radiculopathy, site unspecified: Secondary | ICD-10-CM | POA: Diagnosis not present

## 2023-06-27 DIAGNOSIS — I5023 Acute on chronic systolic (congestive) heart failure: Secondary | ICD-10-CM | POA: Diagnosis not present

## 2023-06-27 DIAGNOSIS — M5441 Lumbago with sciatica, right side: Secondary | ICD-10-CM | POA: Diagnosis not present

## 2023-06-27 DIAGNOSIS — M47816 Spondylosis without myelopathy or radiculopathy, lumbar region: Secondary | ICD-10-CM | POA: Diagnosis not present

## 2023-06-27 DIAGNOSIS — E876 Hypokalemia: Secondary | ICD-10-CM | POA: Diagnosis not present

## 2023-06-27 DIAGNOSIS — Z6835 Body mass index (BMI) 35.0-35.9, adult: Secondary | ICD-10-CM | POA: Diagnosis not present

## 2023-06-27 NOTE — Telephone Encounter (Signed)
 Called to confirm/remind patient of their appointment at the [facility] on 06/27/23.   Appointment:   [] Confirmed  [x] Left mess   [] No answer/No voice mail  [] VM Full/unable to leave message  [] Phone not in service  Patient reminded to bring all medications and/or complete list.  Confirmed patient has transportation. Gave directions, instructed to utilize valet parking.

## 2023-06-27 NOTE — Telephone Encounter (Signed)
 Called to confirm/remind patient of their appointment at the [facility] on 06/27/2023.   Appointment:   [] Confirmed  [x] Left mess   [] No answer/No voice mail  [] VM Full/unable to leave message  [] Phone not in service  Patient reminded to bring all medications and/or complete list.  Confirmed patient has transportation. Gave directions, instructed to utilize valet parking.

## 2023-06-29 NOTE — Progress Notes (Signed)
 AD[facility]ANC[facility]URE CLIN[facility] NOTE  Primary Care: Grooms, [place], [place] Primary Cardiologist: Armida Lander, MD HF Cardiologist: Dr. Bruce Caper EP: Marlane Silver, MD   HPI: [name] [name] is a 48 y.o. female with hx of saddle PE/DVT s/p IVC filter (2015), HFrEF/NICM, pancytopenia, nonobstructive CAD, morbid obesity, noncompliance, pancytopenia (followed by hematology), tobacco use.  In 5/23 she was admitted with acute hypoxic respiratory failure requiring intubation. LHC during that admit w/ nonobstructive CAD. She was admitted again in 9/23 with syncope, nausea and C2 fracture managed conservatively. LVEF during that admit noted to be 35% w/ severe posterior MR.   Admitted with acute on chronic CHF in 06/24. EF was down to 25-30%.   Had been considered for mTEER and possible LVAD but was lost to follow-up until recent readmission.  Admitted to [facility] ICU in 04/25 with cardiogenic shock. Transferred to [facility] with worsening hypotension and increased pressor requirements.  Echo 4/25 EF 20-25% severe RV dysfunction with septal flattening mod-severe MR. Required inotrope/pressor support in addition to lasix  gtt, diamox  and metolazone  to fully diurese. Initially on DBA but later transitioned to milrinone  given severely elevated PA pressures on RHC. She was discharged with home inotrope with plan to workup for advanced therapies as an ouptatient if compliance improved. Discharge weight 244 lbs.  Interval hx:  - Currently on milrinone  0.25mcg/kg/min. Reports that she feels very tired throughout the day. She is able to lay flatter in bed now.  - Planning to undergo TEER on 07/09/23.    Current Outpatient Medications  Medication Sig Dispense Refill   albuterol  (VENTOLIN  HFA) 108 (90 Base) MCG/ACT inhaler Inhale 2 puffs into the lungs every 2 (two) hours as needed for wheezing or shortness of breath. 18 g 11   atorvastatin  (LIPITOR) 20 MG tablet TAKE ONE TABLET BY MOUTH ONCE DAILY. 90 tablet 3    benzonatate  (TESSALON ) 100 MG capsule Take 1 capsule (100 mg total) by mouth 3 (three) times daily as needed for cough. Do not take with alcohol  or while operating or driving heavy machinery 21 capsule 0   bumetanide  (BUMEX ) 2 MG tablet Take 1 tablet (2 mg total) by mouth daily. 90 tablet 4   digoxin  (LANOXIN ) 0.125 MG tablet Take 1 tablet (0.125 mg total) by mouth daily. 90 tablet 4   milrinone  (PRIMACOR ) 20 MG/100 ML SOLN infusion Inject 0.0318 mg/min into the vein continuous.     potassium chloride  SA (KLOR-CON  M) 20 MEQ tablet Take 2 tablets (40 mEq total) by mouth 2 (two) times daily. 120 tablet 6   spironolactone  (ALDACTONE ) 25 MG tablet Take 2 tablets (50 mg total) by mouth daily. 180 tablet 4   warfarin (COUMADIN ) 10 MG tablet Take 10 mg (1 tablet) by mouth daily Monday through Friday, then take 5 mg (1/2 tablet) by mouth on Saturday and on Sunday. 90 tablet 4   No current facility-administered medications for this encounter.     PHYSICAL EXAM: Vitals:   06/30/23 1157  BP: 120/80  Pulse: 88  SpO2: 98%   GENERAL: NAD Lungs- normal work of breathing CARDIAC:  JVP: 79 cm          Normal rate with regular rhythm. 3/6 systolic murmur; +1 edema BLE.  ABDOMEN: Soft, non-tender, non-distended.  EXTREMITIES: Warm and well perfused.  NEUROLOGIC: No obvious FND   Wt Readings from Last 3 Encounters:  06/30/23 110.9 kg (244 lb 6.4 oz)  06/19/23 112 kg (247 lb)  06/09/23 107.5 kg (237 lb)    DATA  REVIEW  ECG: SR 91 bpm, PVC  ECHO: 09/24/21: LVEF 30-35%, RV function mildly reduced, severe posterior MR 8/23: LVEF 30-35%, LVID 7.2cm w/ grade III DD, moderate to severe MR. Severely dilated LA.  09/03/17: LVEF 60%-65%.  TEE 2/24: severe eccentric MR, moderately reduced LV function.  07/03/22: LVEF 25-30% 05/07/23: LVEF 25-30%, grade III DD, RV moderately reduced, moderate posterior MR 05/16/23: LVEF 25-30%, RV moderately reduced, moderate to severe MR   CATH: 06/19/21: 40 to 50%  proximal right coronary narrowing. Right dominant anatomy Coronary arteries otherwise normal Significant elevation LVEDP 30 mmHg consistent with acute on chronic combined systolic and diastolic heart failure Mild pulmonary hypertension, mean pressure 30 mmHg.  WHO group II etiology based on hemodysnamics.. Capillary wedge mean 24 mmHg.  V wave to 40 mmHg suggesting some degree of mitral regurgitation. Cardiac output 8.5 L/min with index 3.57 L/min/m. Pulmonary resistance 0.7 Wood units.  RHC on 7.5 DBA 05/13/23: HEMODYNAMICS: RA:       19 mmHg (mean) RV:       90/9, 19 mmHg PA:       90/38 mmHg (55 mean) PCWP: 27 mmHg (mean) with v waves to 45                                      Estimated Fick CO/CI   6.6L/min, 2.74L/min/m2         Thermodilution CO/CI   6.4L/min, 2.66L/min/m2                                      TPG  28  mmHg                                               PVR  4.375 Wood Units  PAPi  2.73    ASSESSMENT & PLAN: Chronic systolic HF with biventricular dysfunction/stage D cardiomyopathy (terminal) - LHC in 2023 with nonobstructive CAD.  - RHC 4/25 (on 7.5 DBA): Severely elevated biventricular filling pressures, severe post capillary PH, normal CO by TD. - Echo 4/25 EF 25-30% moderately reduced RV with mod/sev MR  - NYHA III-IV. Hypervolemic on exam - Continue home Milrinone  0.25 mcg/kg/min. Will need CBC with diff and CMET every 2 weeks.  - Patient seen with Dr. Bensimhon. Suspect LV function slightly better than reported on echo. However, she has significant RV dysfunction and probably severe MR. Ideally would consider transplant but not a candidate with tobacco use. Likely not an ideal VAD candidate with RV failure. Best option at this point may be mitraclip with eye towards eventual transplant.  - Plan for TEER on 07/09/23 with Dr. Arlester Ladd.  - Repeat BMP/BNP today.  - Mildly hypervolemic on exam; additional bumex  2mg  today.     2. Severe MR - Echo 4/25 with  moderate to severe MR (suspect may be severe) - Severe MR by TEE; RHC with large V waves.  - TEER on 07/09/23.    3. Hx of PE/DVT w/ IVC filter - Submassive saddle PE extending from the distal main pulmonary artery and to the left and right pulmonary arteries with extension into the left upper lobe, left lower lobe and lingula branches and right upper lobe, right middle  lobe and right lower lobes on 04/27/2013  - Evaluated at [facility] where thrombophilia work-up was negative (Lupus inhibitor, anticardiolipin antibodies, and anti beta-2  glycoprotein antibodies were negative; protein C activity 85%, protein S activity 76%, factor V Leiden, and prothrombin gene mutation ) - IVC filter in place; could not be removed due to significant thrombus burden on filter at [facility].  - Previously advised against DOACs due to severity of thrombus burden and weight >250lbs.  - Continue Warfarin   4. Obesity  - Body mass index is 35.07 kg/m.  - BMI significantly down with diuresis - Plan to start GLP1-RA after TEER.    5. CAD - LHC in 5/23 w/ nonobstructive CAD involving RCA - Inferior WMA on most recent echo - stable; no chest pain.    6. Microcytic anemia - Recieved IV iron  during recent admit  7. Tobacco use - Smoking 3 cigarettes/day - discussed importance of cessation with regards to transplant in the future.    Lonney Revak 1:10 PM

## 2023-06-30 ENCOUNTER — Ambulatory Visit

## 2023-06-30 ENCOUNTER — Ambulatory Visit (INDEPENDENT_AMBULATORY_CARE_PROVIDER_SITE_OTHER): Payer: Self-pay | Admitting: *Deleted

## 2023-06-30 ENCOUNTER — Telehealth: Payer: Self-pay

## 2023-06-30 ENCOUNTER — Ambulatory Visit
Admission: RE | Admit: 2023-06-30 | Discharge: 2023-06-30 | Disposition: A | Discharge status: Home / Self Care | Source: Ambulatory Visit | Attending: Cardiology | Admitting: Cardiology

## 2023-06-30 ENCOUNTER — Encounter (HOSPITAL_COMMUNITY): Payer: Self-pay | Admitting: Cardiology

## 2023-06-30 VITALS — BP 120/80 | HR 88 | Wt 244.4 lb

## 2023-06-30 DIAGNOSIS — Z86718 Personal history of other venous thrombosis and embolism: Secondary | ICD-10-CM | POA: Diagnosis not present

## 2023-06-30 DIAGNOSIS — I5082 Biventricular heart failure: Secondary | ICD-10-CM

## 2023-06-30 DIAGNOSIS — I428 Other cardiomyopathies: Secondary | ICD-10-CM | POA: Diagnosis not present

## 2023-06-30 DIAGNOSIS — Z79899 Other long term (current) drug therapy: Secondary | ICD-10-CM | POA: Insufficient documentation

## 2023-06-30 DIAGNOSIS — I5020 Unspecified systolic (congestive) heart failure: Secondary | ICD-10-CM

## 2023-06-30 DIAGNOSIS — Z7901 Long term (current) use of anticoagulants: Secondary | ICD-10-CM | POA: Diagnosis not present

## 2023-06-30 DIAGNOSIS — E66812 Obesity, class 2: Secondary | ICD-10-CM | POA: Diagnosis not present

## 2023-06-30 DIAGNOSIS — D6851 Activated protein C resistance: Secondary | ICD-10-CM | POA: Insufficient documentation

## 2023-06-30 DIAGNOSIS — I2510 Atherosclerotic heart disease of native coronary artery without angina pectoris: Secondary | ICD-10-CM | POA: Insufficient documentation

## 2023-06-30 DIAGNOSIS — Z6835 Body mass index (BMI) 35.0-35.9, adult: Secondary | ICD-10-CM | POA: Diagnosis not present

## 2023-06-30 DIAGNOSIS — D6852 Prothrombin gene mutation: Secondary | ICD-10-CM | POA: Diagnosis not present

## 2023-06-30 DIAGNOSIS — D61818 Other pancytopenia: Secondary | ICD-10-CM | POA: Insufficient documentation

## 2023-06-30 DIAGNOSIS — Z86711 Personal history of pulmonary embolism: Secondary | ICD-10-CM | POA: Diagnosis not present

## 2023-06-30 DIAGNOSIS — I340 Nonrheumatic mitral (valve) insufficiency: Secondary | ICD-10-CM | POA: Diagnosis not present

## 2023-06-30 DIAGNOSIS — I5022 Chronic systolic (congestive) heart failure: Secondary | ICD-10-CM | POA: Insufficient documentation

## 2023-06-30 DIAGNOSIS — D509 Iron deficiency anemia, unspecified: Secondary | ICD-10-CM | POA: Insufficient documentation

## 2023-06-30 DIAGNOSIS — F17210 Nicotine dependence, cigarettes, uncomplicated: Secondary | ICD-10-CM | POA: Insufficient documentation

## 2023-06-30 DIAGNOSIS — Z5181 Encounter for therapeutic drug level monitoring: Secondary | ICD-10-CM | POA: Diagnosis not present

## 2023-06-30 LAB — BASIC METABOLIC PANEL WITH GFR
Anion gap: 8 (ref 5–15)
BUN: 14 mg/dL (ref 6–20)
CO2: 31 mmol/L (ref 22–32)
Calcium: 8.9 mg/dL (ref 8.9–10.3)
Chloride: 97 mmol/L — ABNORMAL LOW (ref 98–111)
Creatinine, Ser: 1 mg/dL (ref 0.44–1.00)
GFR, Estimated: >60 mL/min (ref >60)
Glucose, Bld: 97 mg/dL (ref 70–99)
Potassium: 3.7 mmol/L (ref 3.5–5.1)
Sodium: 136 mmol/L (ref 135–145)

## 2023-06-30 LAB — PROTIME-INR
INR: 1.6 — ABNORMAL HIGH (ref 0.8–1.2)
Prothrombin Time: 19.5 s — ABNORMAL HIGH (ref 11.4–15.2)

## 2023-06-30 LAB — CBC
HCT: 42.7 % (ref 36.0–46.0)
Hemoglobin: 13.2 g/dL (ref 12.0–15.0)
MCH: 26 pg (ref 26.0–34.0)
MCHC: 30.9 g/dL (ref 30.0–36.0)
MCV: 84.2 fL (ref 80.0–100.0)
Platelets: 189 10*3/uL (ref 150–400)
RBC: 5.07 MIL/uL (ref 3.87–5.11)
RDW: 23.6 % — ABNORMAL HIGH (ref 11.5–15.5)
WBC: 3.8 10*3/uL — ABNORMAL LOW (ref 4.0–10.5)
nRBC: 0 % (ref 0.0–0.2)

## 2023-06-30 LAB — BRAIN NATRIURETIC PEPTIDE: B Natriuretic Peptide: 600.6 pg/mL — ABNORMAL HIGH (ref 0.0–100.0)

## 2023-06-30 NOTE — Patient Instructions (Signed)
 Medication Changes:  TAKE AN EXTRA BUMEX  TODAY   Lab Work:  Labs done today, your results will be available in MyChart, we will contact you for abnormal readings.  Follow-Up in: 1 MONTH WITH DR. Bruce Caper AS SCHEDULED   At the [facility], you and your health needs are our priority. We have a designated team specialized in the treatment of Heart Failure. This Care Team includes your primary Heart Failure Specialized Cardiologist (physician), Advanced Practice Providers (APPs- Physician Assistants and Nurse Practitioners), and Pharmacist who all work together to provide you with the care you need, when you need it.   You may see any of the following providers on your designated Care Team at your next follow up:  Dr. Jules Oar Dr. Peder Bourdon Dr. Alwin Baars Dr. Judyth Nunnery Nieves Bars, NP Ruddy Corral, [place] [facility] [place], [place] Dennise Fitz, NP [name] [name], NP Luster Salters, PharmD   Please be sure to bring in all your medications bottles to every appointment.   Need to Contact Us :  If you have any questions or concerns before your next appointment please send us  a message through [place] or call our office at [phone].    TO LEAVE A MESSAGE FOR THE NURSE SELECT OPTION 2, PLEASE LEAVE A MESSAGE INCLUDING: YOUR NAME DATE OF BIRTH CALL BACK NUMBER REASON FOR CALL**this is important as we prioritize the call backs  YOU WILL RECEIVE A CALL BACK THE SAME DAY AS LONG AS YOU CALL BEFORE 4:00 PM

## 2023-06-30 NOTE — Telephone Encounter (Signed)
 Called pt and gave her INR instructions from INR drawn today.  (See coumadin  note)  Made pt INR appt for 6/11 to set up Lovenox  bridge.  She verbalized understanding.

## 2023-06-30 NOTE — Telephone Encounter (Signed)
 I spoke to patient who was to come to [facility] Coumadin  clinic after her CHF visit to get INR and Lovenox  Bridged.  They have drawn a PT/INR at the CHF appt so advised her that she did not need to come to this appt and we would await INR lab result and reach out to her considering the Lovenox  schedule.  She thanked me for the call.

## 2023-06-30 NOTE — Patient Instructions (Signed)
 Take 1 1/2 tablets of warfarin today. Take 1 tablet tomorrow and come for INR check and  Lovenox  instruction on Wednesday On continuous IV Milrinone  now.  [facility] Recheck in 2 days Potential LVAD in future

## 2023-07-02 ENCOUNTER — Encounter: Payer: Self-pay | Admitting: *Deleted

## 2023-07-02 ENCOUNTER — Ambulatory Visit: Attending: Cardiology

## 2023-07-02 ENCOUNTER — Telehealth: Payer: Self-pay

## 2023-07-02 ENCOUNTER — Encounter

## 2023-07-02 DIAGNOSIS — I340 Nonrheumatic mitral (valve) insufficiency: Secondary | ICD-10-CM

## 2023-07-02 DIAGNOSIS — I5020 Unspecified systolic (congestive) heart failure: Secondary | ICD-10-CM

## 2023-07-02 NOTE — Telephone Encounter (Signed)
 The patient no-showed to her Coumadin  appointment this AM then no-showed to her rescheduled appt again at 1600.  Called the number on file and got her mother, who shared a number for [name].  Spoke with Yarlin and scheduled her for Lovenox  bridge appointment tomorrow with Kristin in the Coumadin  Clinic.  The patient verbalized understanding that if she does not attend the appointment, she will not be able to have MitraClip 6/18. She agreed with plan and verbalized she will attend appointment tomorrow.

## 2023-07-03 ENCOUNTER — Ambulatory Visit

## 2023-07-03 ENCOUNTER — Ambulatory Visit: Attending: Cardiology | Admitting: Pharmacist Clinician (PhC)/ Clinical Pharmacy Specialist

## 2023-07-03 ENCOUNTER — Encounter: Payer: Self-pay | Admitting: Pharmacist

## 2023-07-03 NOTE — Telephone Encounter (Signed)
 Please complete PA for [facility]. Myocardial infarction and obesity diagnosis

## 2023-07-03 NOTE — Addendum Note (Signed)
 Addended by: Jakaiden Fill A on: 07/03/2023 04:18 PM   Modules accepted: Orders

## 2023-07-03 NOTE — Progress Notes (Deleted)
 MitraClip scheduled for 07/09/23  June 12: Last dose of warfarin.  June 13: No warfarin or enoxaparin  (Lovenox ).  June 14: Inject enoxaparin  150 mg in the fatty abdominal tissue at least 2 inches from the belly button twice a day at 8 pm; rotate sites daily. No warfarin.  June 15: Inject enoxaparin  150 mg in the fatty tissue at 8 pm. No warfarin.  June 16: Inject enoxaparin  150 mg in the fatty tissue at 8 pm. No warfarin.  June 17: No enoxaparin , no warfarin  June 18: Procedure Day - No enoxaparin  - Resume warfarin with 10 mg (1 tablet) in the evening or as directed by doctor   June 19: Resume enoxaparin  inject in the fatty tissue at 8 am and take warfarin 15 mg (1.5 tablets)  June 20: Inject enoxaparin  in the fatty tissue at 8 am and take warfarin 15 mg (1.5 tablets)  June 21: Inject enoxaparin  in the fatty tissue at 8 am and take warfarin 10 mg (1 tablet)  June 22: Inject enoxaparin  in the fatty tissue at 8 am and take warfarin 10 mg (1 tablet)  June 23: Inject enoxaparin  in the fatty tissue at 8 am and take warfarin   June 24: Coumadin  Clinic appt to check INR.   10 mg every day x 5 mg STT  : Last dose of warfarin.  : No warfarin or enoxaparin  (Lovenox ).  : Inject enoxaparin  120 mg in the fatty abdominal tissue at least 2 inches from the belly button twice a day about 12 hours apart, 8am and 8pm rotate sites. No warfarin.  : Inject enoxaparin  in the fatty tissue every 12 hours, 8am and 8pm. No warfarin.  : Inject enoxaparin  in the fatty tissue every 12 hours, 8am and 8pm. No warfarin.  : Inject enoxaparin  in the fatty tissue in the morning at 8 am (No PM dose). No warfarin.  : Procedure Day - No enoxaparin  - Resume warfarin in the evening or as directed by doctor (take an extra half tablet with usual dose for 2 days then resume normal dose).  : Resume enoxaparin  inject in the fatty tissue every 12 hours and take warfarin  : Inject enoxaparin  in the fatty tissue every  12 hours and take warfarin  : Inject enoxaparin  in the fatty tissue every 12 hours and take warfarin  : Inject enoxaparin  in the fatty tissue every 12 hours and take warfarin  : Inject enoxaparin  in the fatty tissue every 12 hours and take warfarin  : warfarin appt to check INR.

## 2023-07-03 NOTE — Telephone Encounter (Signed)
 This encounter was created in error - please disregard.

## 2023-07-03 NOTE — Telephone Encounter (Signed)
 [name]. [name] [name] no-showed to her Coumadin  appointment this AM.  Discussed with Dr. Arlester Ladd. The patient will need to start Lovenox  by Saturday or her MitraClip scheduled 6/18 will be cancelled.  Called the patient several times today. She did not pick up and her number went to a voice mail box that was full. Called the other number on file and reached her mother twice, who told me she spoke with the patient and asked her to call me to discuss a new plan.  After a third call/conversation with the patient's mother, she called the patient and [name] [name] called me at 1530.  [name] [name] said her phone had been dead all day. We had a very candid conversation and [name] [name] reiterated that she wants to proceed with mTEER despite her noncompliance.  She agreed to go to [facility] and have labs drawn (including INR) today before 1700. She understood that if she does not have her labs drawn, her surgery will be cancelled.  She promised several times to do as instructed from now on - post-procedure follow up was discussed as well and she agreed to come to all scheduled appointments.  She promised to keep her phone charged and to answer it if I call. She also understood that if she does not answer when the pharmacist calls tomorrow, her procedure will be cancelled.   She was grateful for follow-up and agreed with plan.

## 2023-07-04 ENCOUNTER — Telehealth: Payer: Self-pay | Admitting: Cardiovascular Disease

## 2023-07-04 ENCOUNTER — Other Ambulatory Visit: Payer: Self-pay

## 2023-07-04 ENCOUNTER — Other Ambulatory Visit (HOSPITAL_COMMUNITY)
Admission: RE | Admit: 2023-07-04 | Discharge: 2023-07-04 | Disposition: A | Discharge status: Home / Self Care | Source: Ambulatory Visit | Attending: Cardiovascular Disease | Admitting: Cardiovascular Disease

## 2023-07-04 ENCOUNTER — Other Ambulatory Visit: Payer: Self-pay | Admitting: Physician Assistant

## 2023-07-04 ENCOUNTER — Ambulatory Visit: Payer: Self-pay

## 2023-07-04 ENCOUNTER — Ambulatory Visit (INDEPENDENT_AMBULATORY_CARE_PROVIDER_SITE_OTHER): Admitting: Pharmacist

## 2023-07-04 ENCOUNTER — Encounter: Payer: Self-pay | Admitting: Pharmacist

## 2023-07-04 DIAGNOSIS — I5020 Unspecified systolic (congestive) heart failure: Secondary | ICD-10-CM | POA: Insufficient documentation

## 2023-07-04 DIAGNOSIS — R1310 Dysphagia, unspecified: Secondary | ICD-10-CM | POA: Diagnosis not present

## 2023-07-04 DIAGNOSIS — F17210 Nicotine dependence, cigarettes, uncomplicated: Secondary | ICD-10-CM | POA: Diagnosis not present

## 2023-07-04 DIAGNOSIS — Z86718 Personal history of other venous thrombosis and embolism: Secondary | ICD-10-CM

## 2023-07-04 DIAGNOSIS — I4729 Other ventricular tachycardia: Secondary | ICD-10-CM | POA: Diagnosis not present

## 2023-07-04 DIAGNOSIS — M5410 Radiculopathy, site unspecified: Secondary | ICD-10-CM | POA: Diagnosis not present

## 2023-07-04 DIAGNOSIS — I2721 Secondary pulmonary arterial hypertension: Secondary | ICD-10-CM | POA: Diagnosis not present

## 2023-07-04 DIAGNOSIS — I051 Rheumatic mitral insufficiency: Secondary | ICD-10-CM | POA: Diagnosis not present

## 2023-07-04 DIAGNOSIS — I252 Old myocardial infarction: Secondary | ICD-10-CM | POA: Diagnosis not present

## 2023-07-04 DIAGNOSIS — Z6835 Body mass index (BMI) 35.0-35.9, adult: Secondary | ICD-10-CM | POA: Diagnosis not present

## 2023-07-04 DIAGNOSIS — I2510 Atherosclerotic heart disease of native coronary artery without angina pectoris: Secondary | ICD-10-CM | POA: Diagnosis not present

## 2023-07-04 DIAGNOSIS — M47816 Spondylosis without myelopathy or radiculopathy, lumbar region: Secondary | ICD-10-CM | POA: Diagnosis not present

## 2023-07-04 DIAGNOSIS — I5043 Acute on chronic combined systolic (congestive) and diastolic (congestive) heart failure: Secondary | ICD-10-CM | POA: Diagnosis not present

## 2023-07-04 DIAGNOSIS — Z7901 Long term (current) use of anticoagulants: Secondary | ICD-10-CM | POA: Diagnosis not present

## 2023-07-04 DIAGNOSIS — I340 Nonrheumatic mitral (valve) insufficiency: Secondary | ICD-10-CM

## 2023-07-04 DIAGNOSIS — M51369 Other intervertebral disc degeneration, lumbar region without mention of lumbar back pain or lower extremity pain: Secondary | ICD-10-CM | POA: Diagnosis not present

## 2023-07-04 DIAGNOSIS — I428 Other cardiomyopathies: Secondary | ICD-10-CM | POA: Diagnosis not present

## 2023-07-04 DIAGNOSIS — I361 Nonrheumatic tricuspid (valve) insufficiency: Secondary | ICD-10-CM

## 2023-07-04 DIAGNOSIS — G894 Chronic pain syndrome: Secondary | ICD-10-CM | POA: Diagnosis not present

## 2023-07-04 DIAGNOSIS — I5084 End stage heart failure: Secondary | ICD-10-CM | POA: Diagnosis not present

## 2023-07-04 DIAGNOSIS — G4733 Obstructive sleep apnea (adult) (pediatric): Secondary | ICD-10-CM | POA: Diagnosis not present

## 2023-07-04 DIAGNOSIS — M48061 Spinal stenosis, lumbar region without neurogenic claudication: Secondary | ICD-10-CM | POA: Diagnosis not present

## 2023-07-04 DIAGNOSIS — I110 Hypertensive heart disease with heart failure: Secondary | ICD-10-CM | POA: Diagnosis not present

## 2023-07-04 DIAGNOSIS — I5082 Biventricular heart failure: Secondary | ICD-10-CM | POA: Diagnosis not present

## 2023-07-04 DIAGNOSIS — D509 Iron deficiency anemia, unspecified: Secondary | ICD-10-CM | POA: Diagnosis not present

## 2023-07-04 DIAGNOSIS — E876 Hypokalemia: Secondary | ICD-10-CM | POA: Diagnosis not present

## 2023-07-04 DIAGNOSIS — M5442 Lumbago with sciatica, left side: Secondary | ICD-10-CM | POA: Diagnosis not present

## 2023-07-04 DIAGNOSIS — I5023 Acute on chronic systolic (congestive) heart failure: Secondary | ICD-10-CM | POA: Diagnosis not present

## 2023-07-04 DIAGNOSIS — M5441 Lumbago with sciatica, right side: Secondary | ICD-10-CM | POA: Diagnosis not present

## 2023-07-04 DIAGNOSIS — Z452 Encounter for adjustment and management of vascular access device: Secondary | ICD-10-CM | POA: Diagnosis not present

## 2023-07-04 LAB — COMPREHENSIVE METABOLIC PANEL WITH GFR
ALT: 13 U/L (ref 0–44)
AST: 25 U/L (ref 15–41)
Albumin: 3.2 g/dL — ABNORMAL LOW (ref 3.5–5.0)
Alkaline Phosphatase: 86 U/L (ref 38–126)
Anion gap: 9 (ref 5–15)
BUN: 15 mg/dL (ref 6–20)
CO2: 28 mmol/L (ref 22–32)
Calcium: 9.1 mg/dL (ref 8.9–10.3)
Chloride: 95 mmol/L — ABNORMAL LOW (ref 98–111)
Creatinine, Ser: 0.8 mg/dL (ref 0.44–1.00)
GFR, Estimated: >60 mL/min (ref >60)
Glucose, Bld: 138 mg/dL — ABNORMAL HIGH (ref 70–99)
Potassium: 3.5 mmol/L (ref 3.5–5.1)
Sodium: 132 mmol/L — ABNORMAL LOW (ref 135–145)
Total Bilirubin: 1.6 mg/dL — ABNORMAL HIGH (ref 0.0–1.2)
Total Protein: 7.5 g/dL (ref 6.5–8.1)

## 2023-07-04 LAB — CBC WITH DIFFERENTIAL/PLATELET
Abs Immature Granulocytes: 0 10*3/uL (ref 0.00–0.07)
Basophils Absolute: 0 10*3/uL (ref 0.0–0.1)
Basophils Relative: 1 %
Eosinophils Absolute: 0.2 10*3/uL (ref 0.0–0.5)
Eosinophils Relative: 6 %
HCT: 41.6 % (ref 36.0–46.0)
Hemoglobin: 12.9 g/dL (ref 12.0–15.0)
Immature Granulocytes: 0 %
Lymphocytes Relative: 19 %
Lymphs Abs: 0.7 10*3/uL (ref 0.7–4.0)
MCH: 26.5 pg (ref 26.0–34.0)
MCHC: 31 g/dL (ref 30.0–36.0)
MCV: 85.6 fL (ref 80.0–100.0)
Monocytes Absolute: 0.3 10*3/uL (ref 0.1–1.0)
Monocytes Relative: 9 %
Neutro Abs: 2.3 10*3/uL (ref 1.7–7.7)
Neutrophils Relative %: 65 %
Platelets: 188 10*3/uL (ref 150–400)
RBC: 4.86 MIL/uL (ref 3.87–5.11)
RDW: 22.9 % — ABNORMAL HIGH (ref 11.5–15.5)
WBC: 3.5 10*3/uL — ABNORMAL LOW (ref 4.0–10.5)
nRBC: 0 % (ref 0.0–0.2)

## 2023-07-04 LAB — PROTIME-INR
INR: 1.8 — ABNORMAL HIGH (ref 0.8–1.2)
Prothrombin Time: 20.9 s — ABNORMAL HIGH (ref 11.4–15.2)

## 2023-07-04 LAB — BRAIN NATRIURETIC PEPTIDE: B Natriuretic Peptide: 612 pg/mL — ABNORMAL HIGH (ref 0.0–100.0)

## 2023-07-04 MED ORDER — ENOXAPARIN SODIUM 80 MG/0.8ML IJ SOSY: PREFILLED_SYRINGE | INTRAMUSCULAR | 0 refills | Status: DC

## 2023-07-04 MED ORDER — ENOXAPARIN SODIUM 120 MG/0.8ML IJ SOSY: 120.0000 mg | PREFILLED_SYRINGE | Freq: Two times a day (BID) | INTRAMUSCULAR | 0 refills | Status: DC

## 2023-07-04 NOTE — Addendum Note (Signed)
 Addended by: Kaelen Caughlin A on: 07/04/2023 09:45 AM   Modules accepted: Orders

## 2023-07-04 NOTE — Progress Notes (Signed)
 Stat Lab INR for upcoming procedure requiring Lovenox  Bridge.   6/13: No warfarin or enoxaparin  (Lovenox ).  6/14: Inject enoxaparin  120mg  in the fatty abdominal tissue at least 2 inches from the belly button twice a day about 12 hours apart, 8am and 8pm rotate sites. No warfarin.  6/15: Inject enoxaparin  120mg  in the fatty tissue every 12 hours, 8am and 8pm. No warfarin.  6/16: Inject enoxaparin  120mg  in the fatty tissue every 12 hours, 8am and 8pm. No warfarin.  6/17: Inject enoxaparin  120mg  in the fatty tissue in the morning at 8 am (No PM dose). No warfarin.  6/18: Procedure Day - No enoxaparin  - Resume warfarin in the evening or as directed by doctor (take an extra half tablet with usual dose for 2 days then resume normal dose).  6/19: Resume enoxaparin  inject in the fatty tissue every 12 hours and take warfarin  6/20: Inject enoxaparin  in the fatty tissue every 12 hours and take warfarin  6/21: Inject enoxaparin  in the fatty tissue every 12 hours and take warfarin  6/22: Inject enoxaparin  in the fatty tissue every 12 hours and take warfarin  6/23: Inject enoxaparin  in the fatty tissue every 12 hours and take warfarin  6/24: warfarin appt to check INR.

## 2023-07-04 NOTE — Telephone Encounter (Signed)
 Pavero, Christopher, [facility] to DARION JUHASZ     07/04/23  3:20 PM Hi Ephraim Hash, please disregard the previous instructions.  Here are your new instructions   6/13: No warfarin or enoxaparin  (Lovenox ).   6/14: Inject enoxaparin  160mg  (TWO 80MG  SYRINGES)in the fatty abdominal tissue at least 2 inches from the belly button. No warfarin.   6/15: Inject enoxaparin  160mg  in the fatty tissue in the morning. No warfarin.   6/16: Inject enoxaparin  160mg  in the fatty tissue in the morning. No warfarin.   6/17: Inject enoxaparin  160mg  in the fatty tissue in the morning No warfarin.   6/18: Procedure Day - No enoxaparin  - Resume warfarin in the evening or as directed by doctor (take an extra half tablet with usual dose for 2 days then resume normal dose).   6/19: Resume enoxaparin  inject in the fatty tissue every 12 hours and take warfarin   6/20: Inject enoxaparin  in the fatty tissue every 12 hours and take warfarin   6/21: Inject enoxaparin  in the fatty tissue every 12 hours and take warfarin   6/22: Inject enoxaparin  in the fatty tissue every 12 hours and take warfarin   6/23: Inject enoxaparin  in the fatty tissue every 12 hours and take warfarin   6/24: warfarin appt to check INR.   Last read by Loras Rockers at 3:54PM on 07/04/2023.  Spoke with [facility]- Pharmacist and verified the correct dose of Lovenox  is 80 mg. 2 syringes= 160 mg.  She discontinued the previous dose of 120 (2 60 mg syringes)

## 2023-07-04 NOTE — Telephone Encounter (Signed)
 Pt c/o medication issue:  1. Name of Medication:   enoxaparin  (LOVENOX ) 80 MG/0.8ML injection   2. How are you currently taking this medication (dosage and times per day)?   3. Are you having a reaction (difficulty breathing--STAT)?   4. What is your medication issue?   Caller Heidi Llamas) stated she received two different prescriptions of this medication and wants to know which prescription is accurate.

## 2023-07-04 NOTE — Patient Instructions (Signed)
 [name] [name]

## 2023-07-07 ENCOUNTER — Inpatient Hospital Stay (HOSPITAL_COMMUNITY): Admission: RE | Admit: 2023-07-07 | Source: Ambulatory Visit

## 2023-07-09 ENCOUNTER — Inpatient Hospital Stay (HOSPITAL_COMMUNITY)

## 2023-07-09 ENCOUNTER — Ambulatory Visit: Payer: Medicare HMO | Admitting: Cardiology

## 2023-07-09 ENCOUNTER — Inpatient Hospital Stay (HOSPITAL_COMMUNITY): Admitting: Certified Registered"

## 2023-07-09 ENCOUNTER — Inpatient Hospital Stay (HOSPITAL_COMMUNITY)
Admission: RE | Admit: 2023-07-09 | Discharge: 2023-08-05 | DRG: 001 | Disposition: A | Discharge status: Home Health | Attending: Surgery | Admitting: Surgery

## 2023-07-09 ENCOUNTER — Inpatient Hospital Stay (HOSPITAL_COMMUNITY)
Admission: RE | Disposition: A | Discharge status: Home Health | Payer: Self-pay | Source: Ambulatory Visit | Attending: Surgery

## 2023-07-09 DIAGNOSIS — Z95811 Presence of heart assist device: Secondary | ICD-10-CM | POA: Diagnosis not present

## 2023-07-09 DIAGNOSIS — I130 Hypertensive heart and chronic kidney disease with heart failure and stage 1 through stage 4 chronic kidney disease, or unspecified chronic kidney disease: Secondary | ICD-10-CM | POA: Diagnosis not present

## 2023-07-09 DIAGNOSIS — Z79899 Other long term (current) drug therapy: Secondary | ICD-10-CM

## 2023-07-09 DIAGNOSIS — Z01810 Encounter for preprocedural cardiovascular examination: Secondary | ICD-10-CM | POA: Diagnosis not present

## 2023-07-09 DIAGNOSIS — I340 Nonrheumatic mitral (valve) insufficiency: Secondary | ICD-10-CM

## 2023-07-09 DIAGNOSIS — I272 Pulmonary hypertension, unspecified: Secondary | ICD-10-CM | POA: Diagnosis present

## 2023-07-09 DIAGNOSIS — I5033 Acute on chronic diastolic (congestive) heart failure: Secondary | ICD-10-CM | POA: Diagnosis not present

## 2023-07-09 DIAGNOSIS — I5043 Acute on chronic combined systolic (congestive) and diastolic (congestive) heart failure: Secondary | ICD-10-CM | POA: Diagnosis present

## 2023-07-09 DIAGNOSIS — I472 Ventricular tachycardia, unspecified: Secondary | ICD-10-CM | POA: Diagnosis not present

## 2023-07-09 DIAGNOSIS — I517 Cardiomegaly: Secondary | ICD-10-CM | POA: Diagnosis not present

## 2023-07-09 DIAGNOSIS — R262 Difficulty in walking, not elsewhere classified: Secondary | ICD-10-CM | POA: Diagnosis not present

## 2023-07-09 DIAGNOSIS — I9752 Accidental puncture and laceration of a circulatory system organ or structure during other procedure: Secondary | ICD-10-CM | POA: Diagnosis not present

## 2023-07-09 DIAGNOSIS — I5020 Unspecified systolic (congestive) heart failure: Secondary | ICD-10-CM | POA: Diagnosis not present

## 2023-07-09 DIAGNOSIS — I071 Rheumatic tricuspid insufficiency: Secondary | ICD-10-CM | POA: Diagnosis present

## 2023-07-09 DIAGNOSIS — J9601 Acute respiratory failure with hypoxia: Secondary | ICD-10-CM | POA: Diagnosis not present

## 2023-07-09 DIAGNOSIS — Z91199 Patient's noncompliance with other medical treatment and regimen due to unspecified reason: Secondary | ICD-10-CM

## 2023-07-09 DIAGNOSIS — E662 Morbid (severe) obesity with alveolar hypoventilation: Secondary | ICD-10-CM | POA: Diagnosis present

## 2023-07-09 DIAGNOSIS — I428 Other cardiomyopathies: Secondary | ICD-10-CM | POA: Diagnosis not present

## 2023-07-09 DIAGNOSIS — J9602 Acute respiratory failure with hypercapnia: Secondary | ICD-10-CM | POA: Diagnosis not present

## 2023-07-09 DIAGNOSIS — Z86718 Personal history of other venous thrombosis and embolism: Secondary | ICD-10-CM | POA: Diagnosis not present

## 2023-07-09 DIAGNOSIS — Z6836 Body mass index (BMI) 36.0-36.9, adult: Secondary | ICD-10-CM

## 2023-07-09 DIAGNOSIS — G4733 Obstructive sleep apnea (adult) (pediatric): Secondary | ICD-10-CM | POA: Diagnosis not present

## 2023-07-09 DIAGNOSIS — M898X9 Other specified disorders of bone, unspecified site: Secondary | ICD-10-CM | POA: Diagnosis not present

## 2023-07-09 DIAGNOSIS — G9340 Encephalopathy, unspecified: Secondary | ICD-10-CM | POA: Diagnosis not present

## 2023-07-09 DIAGNOSIS — N182 Chronic kidney disease, stage 2 (mild): Secondary | ICD-10-CM | POA: Diagnosis present

## 2023-07-09 DIAGNOSIS — I361 Nonrheumatic tricuspid (valve) insufficiency: Secondary | ICD-10-CM

## 2023-07-09 DIAGNOSIS — F17210 Nicotine dependence, cigarettes, uncomplicated: Secondary | ICD-10-CM

## 2023-07-09 DIAGNOSIS — I10 Essential (primary) hypertension: Secondary | ICD-10-CM | POA: Diagnosis present

## 2023-07-09 DIAGNOSIS — R570 Cardiogenic shock: Secondary | ICD-10-CM | POA: Diagnosis not present

## 2023-07-09 DIAGNOSIS — D259 Leiomyoma of uterus, unspecified: Secondary | ICD-10-CM | POA: Diagnosis not present

## 2023-07-09 DIAGNOSIS — R918 Other nonspecific abnormal finding of lung field: Secondary | ICD-10-CM | POA: Diagnosis not present

## 2023-07-09 DIAGNOSIS — E46 Unspecified protein-calorie malnutrition: Secondary | ICD-10-CM | POA: Diagnosis present

## 2023-07-09 DIAGNOSIS — Z604 Social exclusion and rejection: Secondary | ICD-10-CM | POA: Diagnosis present

## 2023-07-09 DIAGNOSIS — D6959 Other secondary thrombocytopenia: Secondary | ICD-10-CM | POA: Diagnosis not present

## 2023-07-09 DIAGNOSIS — Z7189 Other specified counseling: Secondary | ICD-10-CM | POA: Diagnosis not present

## 2023-07-09 DIAGNOSIS — R17 Unspecified jaundice: Secondary | ICD-10-CM | POA: Diagnosis not present

## 2023-07-09 DIAGNOSIS — Z95828 Presence of other vascular implants and grafts: Secondary | ICD-10-CM

## 2023-07-09 DIAGNOSIS — Z716 Tobacco abuse counseling: Secondary | ICD-10-CM

## 2023-07-09 DIAGNOSIS — Z882 Allergy status to sulfonamides status: Secondary | ICD-10-CM

## 2023-07-09 DIAGNOSIS — Z006 Encounter for examination for normal comparison and control in clinical research program: Secondary | ICD-10-CM | POA: Diagnosis not present

## 2023-07-09 DIAGNOSIS — Z7901 Long term (current) use of anticoagulants: Secondary | ICD-10-CM

## 2023-07-09 DIAGNOSIS — I509 Heart failure, unspecified: Secondary | ICD-10-CM | POA: Diagnosis not present

## 2023-07-09 DIAGNOSIS — Z809 Family history of malignant neoplasm, unspecified: Secondary | ICD-10-CM

## 2023-07-09 DIAGNOSIS — I083 Combined rheumatic disorders of mitral, aortic and tricuspid valves: Secondary | ICD-10-CM | POA: Diagnosis not present

## 2023-07-09 DIAGNOSIS — D509 Iron deficiency anemia, unspecified: Secondary | ICD-10-CM | POA: Diagnosis not present

## 2023-07-09 DIAGNOSIS — Z539 Procedure and treatment not carried out, unspecified reason: Secondary | ICD-10-CM | POA: Diagnosis present

## 2023-07-09 DIAGNOSIS — G894 Chronic pain syndrome: Secondary | ICD-10-CM | POA: Diagnosis present

## 2023-07-09 DIAGNOSIS — Y718 Miscellaneous cardiovascular devices associated with adverse incidents, not elsewhere classified: Secondary | ICD-10-CM | POA: Diagnosis not present

## 2023-07-09 DIAGNOSIS — I5023 Acute on chronic systolic (congestive) heart failure: Secondary | ICD-10-CM | POA: Diagnosis not present

## 2023-07-09 DIAGNOSIS — Z01818 Encounter for other preprocedural examination: Secondary | ICD-10-CM | POA: Diagnosis not present

## 2023-07-09 DIAGNOSIS — Z832 Family history of diseases of the blood and blood-forming organs and certain disorders involving the immune mechanism: Secondary | ICD-10-CM

## 2023-07-09 DIAGNOSIS — I5022 Chronic systolic (congestive) heart failure: Secondary | ICD-10-CM | POA: Diagnosis not present

## 2023-07-09 DIAGNOSIS — J449 Chronic obstructive pulmonary disease, unspecified: Secondary | ICD-10-CM | POA: Diagnosis present

## 2023-07-09 DIAGNOSIS — I252 Old myocardial infarction: Secondary | ICD-10-CM | POA: Diagnosis not present

## 2023-07-09 DIAGNOSIS — Z833 Family history of diabetes mellitus: Secondary | ICD-10-CM

## 2023-07-09 DIAGNOSIS — Z9889 Other specified postprocedural states: Secondary | ICD-10-CM | POA: Diagnosis not present

## 2023-07-09 DIAGNOSIS — Z86711 Personal history of pulmonary embolism: Secondary | ICD-10-CM

## 2023-07-09 DIAGNOSIS — Z959 Presence of cardiac and vascular implant and graft, unspecified: Secondary | ICD-10-CM | POA: Diagnosis not present

## 2023-07-09 DIAGNOSIS — Z952 Presence of prosthetic heart valve: Secondary | ICD-10-CM | POA: Diagnosis not present

## 2023-07-09 DIAGNOSIS — D6489 Other specified anemias: Secondary | ICD-10-CM | POA: Diagnosis present

## 2023-07-09 DIAGNOSIS — Z515 Encounter for palliative care: Secondary | ICD-10-CM | POA: Diagnosis not present

## 2023-07-09 DIAGNOSIS — R519 Headache, unspecified: Secondary | ICD-10-CM | POA: Diagnosis not present

## 2023-07-09 DIAGNOSIS — R590 Localized enlarged lymph nodes: Secondary | ICD-10-CM | POA: Diagnosis not present

## 2023-07-09 DIAGNOSIS — J9811 Atelectasis: Secondary | ICD-10-CM | POA: Diagnosis not present

## 2023-07-09 DIAGNOSIS — J690 Pneumonitis due to inhalation of food and vomit: Secondary | ICD-10-CM | POA: Diagnosis not present

## 2023-07-09 DIAGNOSIS — E876 Hypokalemia: Secondary | ICD-10-CM | POA: Diagnosis not present

## 2023-07-09 DIAGNOSIS — R0602 Shortness of breath: Secondary | ICD-10-CM | POA: Diagnosis not present

## 2023-07-09 DIAGNOSIS — Z452 Encounter for adjustment and management of vascular access device: Secondary | ICD-10-CM | POA: Diagnosis not present

## 2023-07-09 DIAGNOSIS — Y658 Other specified misadventures during surgical and medical care: Secondary | ICD-10-CM | POA: Diagnosis not present

## 2023-07-09 DIAGNOSIS — I5082 Biventricular heart failure: Secondary | ICD-10-CM | POA: Diagnosis not present

## 2023-07-09 DIAGNOSIS — I6782 Cerebral ischemia: Secondary | ICD-10-CM | POA: Diagnosis not present

## 2023-07-09 DIAGNOSIS — J811 Chronic pulmonary edema: Secondary | ICD-10-CM | POA: Diagnosis not present

## 2023-07-09 DIAGNOSIS — D649 Anemia, unspecified: Secondary | ICD-10-CM | POA: Diagnosis not present

## 2023-07-09 DIAGNOSIS — E871 Hypo-osmolality and hyponatremia: Secondary | ICD-10-CM | POA: Diagnosis not present

## 2023-07-09 DIAGNOSIS — I2510 Atherosclerotic heart disease of native coronary artery without angina pectoris: Secondary | ICD-10-CM | POA: Diagnosis not present

## 2023-07-09 DIAGNOSIS — F17200 Nicotine dependence, unspecified, uncomplicated: Secondary | ICD-10-CM | POA: Diagnosis present

## 2023-07-09 DIAGNOSIS — I672 Cerebral atherosclerosis: Secondary | ICD-10-CM | POA: Diagnosis not present

## 2023-07-09 DIAGNOSIS — I3139 Other pericardial effusion (noninflammatory): Secondary | ICD-10-CM | POA: Diagnosis not present

## 2023-07-09 DIAGNOSIS — R0989 Other specified symptoms and signs involving the circulatory and respiratory systems: Secondary | ICD-10-CM | POA: Diagnosis not present

## 2023-07-09 DIAGNOSIS — N179 Acute kidney failure, unspecified: Secondary | ICD-10-CM | POA: Diagnosis not present

## 2023-07-09 DIAGNOSIS — J9690 Respiratory failure, unspecified, unspecified whether with hypoxia or hypercapnia: Secondary | ICD-10-CM | POA: Diagnosis not present

## 2023-07-09 DIAGNOSIS — I493 Ventricular premature depolarization: Secondary | ICD-10-CM | POA: Diagnosis present

## 2023-07-09 DIAGNOSIS — Z4682 Encounter for fitting and adjustment of non-vascular catheter: Secondary | ICD-10-CM | POA: Diagnosis not present

## 2023-07-09 DIAGNOSIS — J810 Acute pulmonary edema: Secondary | ICD-10-CM | POA: Diagnosis not present

## 2023-07-09 DIAGNOSIS — Z8249 Family history of ischemic heart disease and other diseases of the circulatory system: Secondary | ICD-10-CM

## 2023-07-09 DIAGNOSIS — I309 Acute pericarditis, unspecified: Secondary | ICD-10-CM | POA: Diagnosis not present

## 2023-07-09 DIAGNOSIS — I110 Hypertensive heart disease with heart failure: Secondary | ICD-10-CM

## 2023-07-09 DIAGNOSIS — I5084 End stage heart failure: Secondary | ICD-10-CM | POA: Diagnosis not present

## 2023-07-09 DIAGNOSIS — I501 Left ventricular failure: Secondary | ICD-10-CM | POA: Diagnosis not present

## 2023-07-09 DIAGNOSIS — E669 Obesity, unspecified: Secondary | ICD-10-CM | POA: Diagnosis present

## 2023-07-09 DIAGNOSIS — Z8679 Personal history of other diseases of the circulatory system: Secondary | ICD-10-CM | POA: Diagnosis not present

## 2023-07-09 HISTORY — PX: IABP INSERTION: CATH118242

## 2023-07-09 HISTORY — PX: TRANSCATHETER MITRAL EDGE TO EDGE REPAIR: CATH118311

## 2023-07-09 HISTORY — PX: TRANSESOPHAGEAL ECHOCARDIOGRAM (CATH LAB): EP1270

## 2023-07-09 LAB — POCT PREGNANCY, URINE: Preg Test, Ur: NEGATIVE

## 2023-07-09 LAB — POCT ACTIVATED CLOTTING TIME
Activated Clotting Time: 268 s
Activated Clotting Time: 273 s
Activated Clotting Time: 279 s
Activated Clotting Time: 262 s
Activated Clotting Time: 279 s
Activated Clotting Time: 262 s
Activated Clotting Time: 291 s
Activated Clotting Time: 291 s
Activated Clotting Time: 233 s

## 2023-07-09 LAB — POCT I-STAT 7, (LYTES, BLD GAS, ICA,H+H)
Acid-Base Excess: 1 mmol/L (ref 0.0–2.0)
Acid-Base Excess: 0 mmol/L (ref 0.0–2.0)
Acid-Base Excess: 1 mmol/L (ref 0.0–2.0)
Acid-Base Excess: 3 mmol/L — ABNORMAL HIGH (ref 0.0–2.0)
Acid-Base Excess: 1 mmol/L (ref 0.0–2.0)
Acid-Base Excess: 0 mmol/L (ref 0.0–2.0)
Acid-Base Excess: 1 mmol/L (ref 0.0–2.0)
Bicarbonate: 27.8 mmol/L (ref 20.0–28.0)
Bicarbonate: 28.7 mmol/L — ABNORMAL HIGH (ref 20.0–28.0)
Bicarbonate: 27.3 mmol/L (ref 20.0–28.0)
Bicarbonate: 29.8 mmol/L — ABNORMAL HIGH (ref 20.0–28.0)
Bicarbonate: 28.5 mmol/L — ABNORMAL HIGH (ref 20.0–28.0)
Bicarbonate: 28.4 mmol/L — ABNORMAL HIGH (ref 20.0–28.0)
Bicarbonate: 29 mmol/L — ABNORMAL HIGH (ref 20.0–28.0)
Calcium, Ion: 1.18 mmol/L (ref 1.15–1.40)
Calcium, Ion: 1.17 mmol/L (ref 1.15–1.40)
Calcium, Ion: 1.14 mmol/L — ABNORMAL LOW (ref 1.15–1.40)
Calcium, Ion: 1.15 mmol/L (ref 1.15–1.40)
Calcium, Ion: 1.12 mmol/L — ABNORMAL LOW (ref 1.15–1.40)
Calcium, Ion: 1.13 mmol/L — ABNORMAL LOW (ref 1.15–1.40)
Calcium, Ion: 1.14 mmol/L — ABNORMAL LOW (ref 1.15–1.40)
HCT: 42 % (ref 36.0–46.0)
HCT: 45 % (ref 36.0–46.0)
HCT: 44 % (ref 36.0–46.0)
HCT: 45 % (ref 36.0–46.0)
HCT: 45 % (ref 36.0–46.0)
HCT: 44 % (ref 36.0–46.0)
HCT: 41 % (ref 36.0–46.0)
Hemoglobin: 14.3 g/dL (ref 12.0–15.0)
Hemoglobin: 15.3 g/dL — ABNORMAL HIGH (ref 12.0–15.0)
Hemoglobin: 15 g/dL (ref 12.0–15.0)
Hemoglobin: 15.3 g/dL — ABNORMAL HIGH (ref 12.0–15.0)
Hemoglobin: 15.3 g/dL — ABNORMAL HIGH (ref 12.0–15.0)
Hemoglobin: 15 g/dL (ref 12.0–15.0)
Hemoglobin: 13.9 g/dL (ref 12.0–15.0)
O2 Saturation: 94 %
O2 Saturation: 90 %
O2 Saturation: 65 %
O2 Saturation: 91 %
O2 Saturation: 92 %
O2 Saturation: 97 %
O2 Saturation: 100 %
Patient temperature: 37
Patient temperature: 36.2
Potassium: 3.6 mmol/L (ref 3.5–5.1)
Potassium: 4.2 mmol/L (ref 3.5–5.1)
Potassium: 4.2 mmol/L (ref 3.5–5.1)
Potassium: 4.3 mmol/L (ref 3.5–5.1)
Potassium: 3.8 mmol/L (ref 3.5–5.1)
Potassium: 3.8 mmol/L (ref 3.5–5.1)
Potassium: 3.7 mmol/L (ref 3.5–5.1)
Sodium: 136 mmol/L (ref 135–145)
Sodium: 136 mmol/L (ref 135–145)
Sodium: 136 mmol/L (ref 135–145)
Sodium: 136 mmol/L (ref 135–145)
Sodium: 136 mmol/L (ref 135–145)
Sodium: 138 mmol/L (ref 135–145)
Sodium: 138 mmol/L (ref 135–145)
TCO2: 29 mmol/L (ref 22–32)
TCO2: 31 mmol/L (ref 22–32)
TCO2: 29 mmol/L (ref 22–32)
TCO2: 31 mmol/L (ref 22–32)
TCO2: 30 mmol/L (ref 22–32)
TCO2: 30 mmol/L (ref 22–32)
TCO2: 31 mmol/L (ref 22–32)
pCO2 arterial: 49.8 mmHg — ABNORMAL HIGH (ref 32–48)
pCO2 arterial: 62.9 mmHg — ABNORMAL HIGH (ref 32–48)
pCO2 arterial: 48.8 mmHg — ABNORMAL HIGH (ref 32–48)
pCO2 arterial: 55.2 mmHg — ABNORMAL HIGH (ref 32–48)
pCO2 arterial: 55.7 mmHg — ABNORMAL HIGH (ref 32–48)
pCO2 arterial: 61.2 mmHg — ABNORMAL HIGH (ref 32–48)
pCO2 arterial: 59.8 mmHg — ABNORMAL HIGH (ref 32–48)
pH, Arterial: 7.355 (ref 7.35–7.45)
pH, Arterial: 7.267 — ABNORMAL LOW (ref 7.35–7.45)
pH, Arterial: 7.341 — ABNORMAL LOW (ref 7.35–7.45)
pH, Arterial: 7.356 (ref 7.35–7.45)
pH, Arterial: 7.318 — ABNORMAL LOW (ref 7.35–7.45)
pH, Arterial: 7.271 — ABNORMAL LOW (ref 7.35–7.45)
pH, Arterial: 7.294 — ABNORMAL LOW (ref 7.35–7.45)
pO2, Arterial: 76 mmHg — ABNORMAL LOW (ref 83–108)
pO2, Arterial: 69 mmHg — ABNORMAL LOW (ref 83–108)
pO2, Arterial: 36 mmHg — CL (ref 83–108)
pO2, Arterial: 65 mmHg — ABNORMAL LOW (ref 83–108)
pO2, Arterial: 70 mmHg — ABNORMAL LOW (ref 83–108)
pO2, Arterial: 98 mmHg (ref 83–108)
pO2, Arterial: 262 mmHg — ABNORMAL HIGH (ref 83–108)

## 2023-07-09 LAB — POCT I-STAT EG7
Acid-Base Excess: 2 mmol/L (ref 0.0–2.0)
Bicarbonate: 28.7 mmol/L — ABNORMAL HIGH (ref 20.0–28.0)
Calcium, Ion: 1.15 mmol/L (ref 1.15–1.40)
HCT: 45 % (ref 36.0–46.0)
Hemoglobin: 15.3 g/dL — ABNORMAL HIGH (ref 12.0–15.0)
O2 Saturation: 64 %
Potassium: 4.2 mmol/L (ref 3.5–5.1)
Sodium: 136 mmol/L (ref 135–145)
TCO2: 30 mmol/L (ref 22–32)
pCO2, Ven: 53.7 mmHg (ref 44–60)
pH, Ven: 7.337 (ref 7.25–7.43)
pO2, Ven: 36 mmHg (ref 32–45)

## 2023-07-09 LAB — COMPREHENSIVE METABOLIC PANEL WITH GFR
ALT: 14 U/L (ref 0–44)
AST: 31 U/L (ref 15–41)
Albumin: 3.5 g/dL (ref 3.5–5.0)
Alkaline Phosphatase: 86 U/L (ref 38–126)
Anion gap: 16 — ABNORMAL HIGH (ref 5–15)
BUN: 14 mg/dL (ref 6–20)
CO2: 23 mmol/L (ref 22–32)
Calcium: 9.2 mg/dL (ref 8.9–10.3)
Chloride: 97 mmol/L — ABNORMAL LOW (ref 98–111)
Creatinine, Ser: 1.2 mg/dL — ABNORMAL HIGH (ref 0.44–1.00)
GFR, Estimated: 57 mL/min — ABNORMAL LOW (ref >60)
Glucose, Bld: 149 mg/dL — ABNORMAL HIGH (ref 70–99)
Potassium: 3.9 mmol/L (ref 3.5–5.1)
Sodium: 136 mmol/L (ref 135–145)
Total Bilirubin: 1.6 mg/dL — ABNORMAL HIGH (ref 0.0–1.2)
Total Protein: 8.1 g/dL (ref 6.5–8.1)

## 2023-07-09 LAB — BASIC METABOLIC PANEL WITH GFR
Anion gap: 13 (ref 5–15)
BUN: 15 mg/dL (ref 6–20)
CO2: 24 mmol/L (ref 22–32)
Calcium: 8.6 mg/dL — ABNORMAL LOW (ref 8.9–10.3)
Chloride: 100 mmol/L (ref 98–111)
Creatinine, Ser: 1.17 mg/dL — ABNORMAL HIGH (ref 0.44–1.00)
GFR, Estimated: 58 mL/min — ABNORMAL LOW (ref >60)
Glucose, Bld: 116 mg/dL — ABNORMAL HIGH (ref 70–99)
Potassium: 3.6 mmol/L (ref 3.5–5.1)
Sodium: 137 mmol/L (ref 135–145)

## 2023-07-09 LAB — CBC WITH DIFFERENTIAL/PLATELET
Abs Immature Granulocytes: 0.02 10*3/uL (ref 0.00–0.07)
Basophils Absolute: 0 10*3/uL (ref 0.0–0.1)
Basophils Relative: 0 %
Eosinophils Absolute: 0 10*3/uL (ref 0.0–0.5)
Eosinophils Relative: 0 %
HCT: 44.6 % (ref 36.0–46.0)
Hemoglobin: 13.5 g/dL (ref 12.0–15.0)
Immature Granulocytes: 0 %
Lymphocytes Relative: 6 %
Lymphs Abs: 0.3 10*3/uL — ABNORMAL LOW (ref 0.7–4.0)
MCH: 26.3 pg (ref 26.0–34.0)
MCHC: 30.3 g/dL (ref 30.0–36.0)
MCV: 86.9 fL (ref 80.0–100.0)
Monocytes Absolute: 0.1 10*3/uL (ref 0.1–1.0)
Monocytes Relative: 2 %
Neutro Abs: 5.2 10*3/uL (ref 1.7–7.7)
Neutrophils Relative %: 92 %
Platelets: 218 10*3/uL (ref 150–400)
RBC: 5.13 MIL/uL — ABNORMAL HIGH (ref 3.87–5.11)
RDW: 22.1 % — ABNORMAL HIGH (ref 11.5–15.5)
Smear Review: NORMAL
WBC: 5.6 10*3/uL (ref 4.0–10.5)
nRBC: 0 % (ref 0.0–0.2)

## 2023-07-09 LAB — TYPE AND SCREEN
ABO/RH(D): A POS
Antibody Screen: NEGATIVE

## 2023-07-09 LAB — GLUCOSE, CAPILLARY
Glucose-Capillary: 177 mg/dL — ABNORMAL HIGH (ref 70–99)
Glucose-Capillary: 124 mg/dL — ABNORMAL HIGH (ref 70–99)
Glucose-Capillary: 114 mg/dL — ABNORMAL HIGH (ref 70–99)

## 2023-07-09 LAB — PREGNANCY, URINE: Preg Test, Ur: NEGATIVE

## 2023-07-09 LAB — SURGICAL PCR SCREEN
MRSA, PCR: NEGATIVE
Staphylococcus aureus: NEGATIVE

## 2023-07-09 LAB — PROTIME-INR
INR: 1.3 — ABNORMAL HIGH (ref 0.8–1.2)
INR: 1.3 — ABNORMAL HIGH (ref 0.8–1.2)
Prothrombin Time: 16.1 s — ABNORMAL HIGH (ref 11.4–15.2)
Prothrombin Time: 16.2 s — ABNORMAL HIGH (ref 11.4–15.2)

## 2023-07-09 LAB — ECHO TEE

## 2023-07-09 LAB — HEPARIN LEVEL (UNFRACTIONATED): Heparin Unfractionated: <0.1 [IU]/mL — ABNORMAL LOW (ref 0.30–0.70)

## 2023-07-09 LAB — COOXEMETRY PANEL
Carboxyhemoglobin: 4.2 % — ABNORMAL HIGH (ref 0.5–1.5)
Methemoglobin: <0.7 % (ref 0.0–1.5)
O2 Saturation: 82.7 %
Total hemoglobin: 13 g/dL (ref 12.0–16.0)

## 2023-07-09 LAB — CG4 I-STAT (LACTIC ACID)
Lactic Acid, Venous: 0.9 mmol/L (ref 0.5–1.9)
Lactic Acid, Venous: 1.3 mmol/L (ref 0.5–1.9)

## 2023-07-09 LAB — MAGNESIUM: Magnesium: 1.8 mg/dL (ref 1.7–2.4)

## 2023-07-09 SURGERY — MITRAL VALVE REPAIR
Anesthesia: General

## 2023-07-09 MED ORDER — ROCURONIUM BROMIDE 10 MG/ML (PF) SYRINGE
PREFILLED_SYRINGE | INTRAVENOUS | Status: DC | PRN
  Administered 2023-07-09: 20 mg via INTRAVENOUS
  Administered 2023-07-09 (×2): 10 mg via INTRAVENOUS
  Administered 2023-07-09: 60 mg via INTRAVENOUS
  Administered 2023-07-09: 10 mg via INTRAVENOUS
  Administered 2023-07-09: 50 mg via INTRAVENOUS
  Administered 2023-07-09: 10 mg via INTRAVENOUS

## 2023-07-09 MED ORDER — FUROSEMIDE 10 MG/ML IJ SOLN
INTRAMUSCULAR | Status: AC
  Filled 2023-07-09: qty 4

## 2023-07-09 MED ORDER — PANTOPRAZOLE SODIUM 40 MG IV SOLR
40.0000 mg | Freq: Every day | INTRAVENOUS | Status: DC
  Administered 2023-07-09 – 2023-07-14 (×6): 40 mg via INTRAVENOUS
  Filled 2023-07-09 (×6): qty 10

## 2023-07-09 MED ORDER — MIDAZOLAM HCL 2 MG/2ML IJ SOLN
INTRAMUSCULAR | Status: DC | PRN
  Administered 2023-07-09: 1 mg via INTRAVENOUS

## 2023-07-09 MED ORDER — CEFAZOLIN SODIUM-DEXTROSE 2-4 GM/100ML-% IV SOLN
2.0000 g | INTRAVENOUS | Status: AC
  Administered 2023-07-09 (×2): 2 g via INTRAVENOUS
  Filled 2023-07-09: qty 100

## 2023-07-09 MED ORDER — ALBUTEROL SULFATE HFA 108 (90 BASE) MCG/ACT IN AERS
INHALATION_SPRAY | RESPIRATORY_TRACT | Status: DC | PRN
  Administered 2023-07-09: 4 via RESPIRATORY_TRACT

## 2023-07-09 MED ORDER — MILRINONE LACTATE IN DEXTROSE 20-5 MG/100ML-% IV SOLN: 0.3750 ug/kg/min | INTRAVENOUS | Status: DC

## 2023-07-09 MED ORDER — FUROSEMIDE 10 MG/ML IJ SOLN
INTRAMUSCULAR | Status: DC | PRN
  Administered 2023-07-09: 80 mg via INTRAMUSCULAR

## 2023-07-09 MED ORDER — HEPARIN SODIUM (PORCINE) 1000 UNIT/ML IJ SOLN
INTRAMUSCULAR | Status: AC
  Filled 2023-07-09: qty 10

## 2023-07-09 MED ORDER — LIDOCAINE 2% (20 MG/ML) 5 ML SYRINGE
INTRAMUSCULAR | Status: DC | PRN
  Administered 2023-07-09: 40 mg via INTRAVENOUS

## 2023-07-09 MED ORDER — PROPOFOL 10 MG/ML IV BOLUS
INTRAVENOUS | Status: DC | PRN
  Administered 2023-07-09: 50 ug/kg/min via INTRAVENOUS
  Administered 2023-07-09: 130 mg via INTRAVENOUS

## 2023-07-09 MED ORDER — POTASSIUM CHLORIDE 20 MEQ PO PACK
40.0000 meq | PACK | Freq: Once | ORAL | Status: AC
  Administered 2023-07-09: 40 meq
  Filled 2023-07-09: qty 2

## 2023-07-09 MED ORDER — FENTANYL CITRATE (PF) 100 MCG/2ML IJ SOLN: 50.0000 ug | INTRAMUSCULAR | Status: DC | PRN

## 2023-07-09 MED ORDER — DEXAMETHASONE SODIUM PHOSPHATE 10 MG/ML IJ SOLN
INTRAMUSCULAR | Status: DC | PRN
  Administered 2023-07-09: 5 mg via INTRAVENOUS

## 2023-07-09 MED ORDER — MIDAZOLAM HCL 2 MG/2ML IJ SOLN
INTRAMUSCULAR | Status: AC
  Filled 2023-07-09: qty 2

## 2023-07-09 MED ORDER — FENTANYL CITRATE (PF) 100 MCG/2ML IJ SOLN
INTRAMUSCULAR | Status: AC
  Filled 2023-07-09: qty 2

## 2023-07-09 MED ORDER — MILRINONE LACTATE IN DEXTROSE 20-5 MG/100ML-% IV SOLN
0.1250 ug/kg/min | INTRAVENOUS | Status: AC
  Administered 2023-07-09 – 2023-07-11 (×5): 0.375 ug/kg/min via INTRAVENOUS
  Administered 2023-07-12 – 2023-07-13 (×3): 0.25 ug/kg/min via INTRAVENOUS
  Administered 2023-07-13 – 2023-07-17 (×13): 0.375 ug/kg/min via INTRAVENOUS
  Administered 2023-07-18: 0.25 ug/kg/min via INTRAVENOUS
  Administered 2023-07-18: 0.375 ug/kg/min via INTRAVENOUS
  Administered 2023-07-19 – 2023-07-30 (×23): 0.25 ug/kg/min via INTRAVENOUS
  Administered 2023-07-30 – 2023-07-31 (×2): 0.125 ug/kg/min via INTRAVENOUS
  Filled 2023-07-09 (×51): qty 100

## 2023-07-09 MED ORDER — FUROSEMIDE 10 MG/ML IJ SOLN
20.0000 mg/h | INTRAVENOUS | Status: DC
  Administered 2023-07-09 – 2023-07-10 (×3): 20 mg/h via INTRAVENOUS
  Filled 2023-07-09 (×7): qty 20

## 2023-07-09 MED ORDER — PROPOFOL 1000 MG/100ML IV EMUL
0.0000 ug/kg/min | INTRAVENOUS | Status: DC
  Administered 2023-07-09: 60 ug/kg/min via INTRAVENOUS
  Administered 2023-07-09: 50 ug/kg/min via INTRAVENOUS
  Administered 2023-07-09 (×3): 60 ug/kg/min via INTRAVENOUS
  Administered 2023-07-10: 30 ug/kg/min via INTRAVENOUS
  Administered 2023-07-10: 50 ug/kg/min via INTRAVENOUS
  Administered 2023-07-10 (×2): 60 ug/kg/min via INTRAVENOUS
  Administered 2023-07-10: 50 ug/kg/min via INTRAVENOUS
  Administered 2023-07-10: 60 ug/kg/min via INTRAVENOUS
  Administered 2023-07-10: 45 ug/kg/min via INTRAVENOUS
  Administered 2023-07-11: 30 ug/kg/min via INTRAVENOUS
  Filled 2023-07-09 (×6): qty 100
  Filled 2023-07-09: qty 200
  Filled 2023-07-09 (×3): qty 100
  Filled 2023-07-09: qty 200

## 2023-07-09 MED ORDER — NITROPRUSSIDE SODIUM-NACL 20-0.9 MG/100ML-% IV SOLN
0.0000 ug/kg/min | INTRAVENOUS | Status: DC
  Administered 2023-07-09: 0.5 ug/kg/min via INTRAVENOUS
  Administered 2023-07-09: 0.3 ug/kg/min via INTRAVENOUS
  Administered 2023-07-10: 0.4 ug/kg/min via INTRAVENOUS
  Filled 2023-07-09 (×4): qty 100

## 2023-07-09 MED ORDER — SODIUM CHLORIDE 0.9 % IV SOLN: INTRAVENOUS | Status: DC

## 2023-07-09 MED ORDER — CHLORHEXIDINE GLUCONATE CLOTH 2 % EX PADS
6.0000 | MEDICATED_PAD | Freq: Every day | CUTANEOUS | Status: DC
  Administered 2023-07-10 – 2023-07-23 (×14): 6 via TOPICAL

## 2023-07-09 MED ORDER — ALBUTEROL SULFATE (2.5 MG/3ML) 0.083% IN NEBU: 2.5000 mg | INHALATION_SOLUTION | RESPIRATORY_TRACT | Status: DC | PRN

## 2023-07-09 MED ORDER — MILRINONE LACTATE IN DEXTROSE 20-5 MG/100ML-% IV SOLN: INTRAVENOUS | Status: DC | PRN

## 2023-07-09 MED ORDER — POLYETHYLENE GLYCOL 3350 17 G PO PACK
17.0000 g | PACK | Freq: Every day | ORAL | Status: DC
  Administered 2023-07-09: 17 g
  Filled 2023-07-09: qty 1

## 2023-07-09 MED ORDER — CHLORHEXIDINE GLUCONATE 0.12 % MT SOLN
15.0000 mL | Freq: Once | OROMUCOSAL | Status: AC
  Administered 2023-07-09: 15 mL via OROMUCOSAL
  Filled 2023-07-09: qty 15

## 2023-07-09 MED ORDER — PHENYLEPHRINE 80 MCG/ML (10ML) SYRINGE FOR IV PUSH (FOR BLOOD PRESSURE SUPPORT)
PREFILLED_SYRINGE | INTRAVENOUS | Status: DC | PRN
  Administered 2023-07-09: 160 ug via INTRAVENOUS

## 2023-07-09 MED ORDER — INSULIN ASPART 100 UNIT/ML IJ SOLN
0.0000 [IU] | INTRAMUSCULAR | Status: DC
  Administered 2023-07-09: 1 [IU] via SUBCUTANEOUS
  Administered 2023-07-09 – 2023-07-12 (×2): 2 [IU] via SUBCUTANEOUS
  Administered 2023-07-14 – 2023-07-16 (×8): 1 [IU] via SUBCUTANEOUS
  Administered 2023-07-16: 2 [IU] via SUBCUTANEOUS
  Administered 2023-07-16: 1 [IU] via SUBCUTANEOUS
  Administered 2023-07-17: 3 [IU] via SUBCUTANEOUS
  Administered 2023-07-18 – 2023-07-20 (×7): 1 [IU] via SUBCUTANEOUS
  Administered 2023-07-21: 2 [IU] via SUBCUTANEOUS
  Administered 2023-07-21: 3 [IU] via SUBCUTANEOUS
  Administered 2023-07-21: 1 [IU] via SUBCUTANEOUS
  Administered 2023-07-22: 2 [IU] via SUBCUTANEOUS
  Administered 2023-07-22 – 2023-07-23 (×2): 1 [IU] via SUBCUTANEOUS

## 2023-07-09 MED ORDER — ORAL CARE MOUTH RINSE: 15.0000 mL | OROMUCOSAL | Status: DC | PRN

## 2023-07-09 MED ORDER — HEPARIN (PORCINE) 25000 UT/250ML-% IV SOLN
1250.0000 [IU]/h | INTRAVENOUS | Status: DC
  Administered 2023-07-09: 900 [IU]/h via INTRAVENOUS
  Filled 2023-07-09 (×2): qty 250

## 2023-07-09 MED ORDER — ORAL CARE MOUTH RINSE
15.0000 mL | OROMUCOSAL | Status: DC
  Administered 2023-07-09 – 2023-07-11 (×20): 15 mL via OROMUCOSAL

## 2023-07-09 MED ORDER — FENTANYL CITRATE PF 50 MCG/ML IJ SOSY
50.0000 ug | PREFILLED_SYRINGE | INTRAMUSCULAR | Status: AC | PRN
  Administered 2023-07-09 (×3): 50 ug via INTRAVENOUS
  Filled 2023-07-09 (×3): qty 1

## 2023-07-09 MED ORDER — CEFAZOLIN SODIUM-DEXTROSE 2-4 GM/100ML-% IV SOLN
INTRAVENOUS | Status: AC
  Filled 2023-07-09: qty 100

## 2023-07-09 MED ORDER — FENTANYL CITRATE (PF) 250 MCG/5ML IJ SOLN
INTRAMUSCULAR | Status: DC | PRN
  Administered 2023-07-09: 100 ug via INTRAVENOUS

## 2023-07-09 MED ORDER — HEPARIN (PORCINE) IN NACL 2000-0.9 UNIT/L-% IV SOLN
INTRAVENOUS | Status: DC | PRN
  Administered 2023-07-09 (×3): 1000 mL

## 2023-07-09 MED ORDER — ALBUMIN HUMAN 5 % IV SOLN: INTRAVENOUS | Status: DC | PRN

## 2023-07-09 MED ORDER — FENTANYL CITRATE PF 50 MCG/ML IJ SOSY
50.0000 ug | PREFILLED_SYRINGE | INTRAMUSCULAR | Status: DC | PRN
  Administered 2023-07-09 – 2023-07-10 (×4): 50 ug via INTRAVENOUS
  Administered 2023-07-10: 100 ug via INTRAVENOUS
  Administered 2023-07-10 – 2023-07-11 (×3): 50 ug via INTRAVENOUS
  Filled 2023-07-09: qty 2
  Filled 2023-07-09: qty 1
  Filled 2023-07-09 (×2): qty 2

## 2023-07-09 MED ORDER — MAGNESIUM SULFATE 2 GM/50ML IV SOLN
2.0000 g | Freq: Once | INTRAVENOUS | Status: AC
  Administered 2023-07-09: 2 g via INTRAVENOUS
  Filled 2023-07-09: qty 50

## 2023-07-09 MED ORDER — NOREPINEPHRINE 4 MG/250ML-% IV SOLN
INTRAVENOUS | Status: DC | PRN
  Administered 2023-07-09: 3 ug/min via INTRAVENOUS

## 2023-07-09 MED ORDER — LACTATED RINGERS IV SOLN: INTRAVENOUS | Status: DC

## 2023-07-09 MED ORDER — HEPARIN SODIUM (PORCINE) 1000 UNIT/ML IJ SOLN
INTRAMUSCULAR | Status: DC | PRN
  Administered 2023-07-09: 3000 [IU] via INTRAVENOUS
  Administered 2023-07-09: 14000 [IU] via INTRAVENOUS
  Administered 2023-07-09: 3000 [IU] via INTRAVENOUS
  Administered 2023-07-09: 2000 [IU] via INTRAVENOUS
  Administered 2023-07-09: 3000 [IU] via INTRAVENOUS

## 2023-07-09 MED ORDER — DOCUSATE SODIUM 50 MG/5ML PO LIQD
100.0000 mg | Freq: Two times a day (BID) | ORAL | Status: DC
  Administered 2023-07-09 – 2023-07-10 (×4): 100 mg
  Filled 2023-07-09 (×4): qty 10

## 2023-07-09 SURGICAL SUPPLY — 18 items
BALLOON IABP SENS PLUS 8F 50CC (BALLOONS) IMPLANT
CATH MITRA STEERABLE GUIDE (CATHETERS) IMPLANT
CATH SWAN GANZ VIP 7.5F (CATHETERS) IMPLANT
CLIP MITRA G4 DELIVERY SYS XTW (Clip) IMPLANT
CLOSURE PERCLOSE PROSTYLE (VASCULAR PRODUCTS) IMPLANT
GUIDEWIRE INQWIRE 1.5J.035X260 (WIRE) IMPLANT
KIT MICROPUNCTURE NIT STIFF (SHEATH) IMPLANT
KIT VERSACROSS RF TRANS 67 (KITS) IMPLANT
PACK CARDIAC CATHETERIZATION (CUSTOM PROCEDURE TRAY) ×1 IMPLANT
PAD SORBX EP SHIELD 16.5X12 (MISCELLANEOUS) IMPLANT
SHEATH DILAT COONS TAPER 22F (SHEATH) IMPLANT
SHEATH PINNACLE 8F 10CM (SHEATH) IMPLANT
SHEATH PROBE COVER 6X72 (BAG) ×1 IMPLANT
SHIELD CATH-GARD CONTAMINATION (MISCELLANEOUS) IMPLANT
STOPCOCK MORSE 400PSI 3WAY (MISCELLANEOUS) ×6 IMPLANT
SYSTEM MITRACLIP G4 (SYSTAGENIX WOUND MANAGEMENT) IMPLANT
TRANSDUCER W/STOPCOCK (MISCELLANEOUS) ×1 IMPLANT
TUBING ART PRESS 72 MALE/FEM (TUBING) ×1 IMPLANT

## 2023-07-09 NOTE — Consult Note (Signed)
 NAME:  [name] [name], MRN:  [id number], DOB:  [birth year], LOS: 0 ADMISSION DATE:  07/09/2023, CONSULTATION DATE:  07/09/23 REFERRING MD:  AHF, CHIEF COMPLAINT:  vent management   History of Present Illness:  Pt encephalopathic, therefore HPI obtained from EMR.  30 yoF with PMH as below significant for tobacco abuse, CAD, NICM, chronic biventricular HF on chronic milrinone , NSVT, OSA on CPAP, saddle DVT/ PE 2015 s/p IVC on coumadin  (IVC filter not able to be removed due to clot burden), non-compliance, pancytopenia (followed at [facility]) and obesity.    Previously admitted in April 2025 with cardiogenic shock, acute on chronic biventricular HF with overload, echo then with EF 20-25%, severe RV dysfunction, and mod-severe MR.  Was diuresed and discharged home on milrinone  with plans for outpt workup for advanced therapies.  Repeat outpt TEE 06/02/23 showed EF 40-45%, severely dilated  mod reduced RV, severe MR.  Returned today for [employer] today, however procedure complicated by posterior torn leaflet with torrential MR, worsening hypoxia requiring intubation, and cardiogenic shock.  PCCM consulted for assistance with MV management.    Pertinent  Medical History   Past Medical History:  Diagnosis Date   Anemia    Blood transfusion without reported diagnosis    CAD (coronary artery disease)    Nonobstructive   Closed left ankle fracture    August 30 2012   Clotting disorder [facility])    DVT (deep venous thrombosis) (HCC)    L leg   Fibroid tumor    HFrEF (heart failure with reduced ejection fraction) (HCC)    Hidradenitis    Hypertension    Mitral regurgitation    Myocardial infarction (HCC)    NICM (nonischemic cardiomyopathy) (HCC)    NSVT (nonsustained ventricular tachycardia) (HCC)    Obesity    OSA on CPAP    Pulmonary embolism (HCC) 04/2013   Sleep apnea    Significant Hospital Events: Including procedures, antibiotic start and stop dates in addition to other pertinent events   6/18  elective mTEER c/b torn leaflet, torriential MR with progressive hypoxia> intubated and cardiogenic shock requiring IABP  Interim History / Subjective:  NE 2, milrinone  0.375, propofol  50 S/p 80mg  lasix  in cath lab after total of ~3.2L fluid given during procedure PAP in cath lab 89/ 38 (58), Fick 6.13 ACT 233, heparin  gtt deferred to start in ICU  Objective    Blood pressure (!) 134/97, pulse 94, temperature 98.3 F (36.8 C), temperature source Oral, resp. rate 18, height 5' 10 (1.778 m), weight 110.2 kg, SpO2 97%.        Intake/Output Summary (Last 24 hours) at 07/09/2023 1332 Last data filed at 07/09/2023 1207 Gross per 24 hour  Intake 950 ml  Output --  Net 950 ml   Filed Weights   07/09/23 0636  Weight: 110.2 kg   Propofol  50 Examination: General:  critically ill obese adult female sedated on MV HEENT: MM pink/moist, pupils 3/r, ETT Neuro: sedated CV: ST 113, few PVC, mechanical pump sounds- R femoral IABP 1:1, +1 peripheral pulses, warm extremities, R PA cath, L radial aline, pta R [place] CVL PULM:  MV supported, coarse, scattered crackles, few exp wheezes, Plat 24, dp 16, PEEP 8, FiO2 1.0 at 90% GI: soft, bs few, foley- clear light yellow urine overflowing in chamber Extremities: warm/dry, +1 ankle edema  Skin: no rashes   Resolved problem list   Assessment and Plan    Acute hypoxic respiratory failure in setting of acute pulmonary  edema 2/2 severe MR/ fail leafleat Tobacco abuse OSA on CPAP - cont full MV support, 8cc/kg IBW with goal Pplat <30 and DP<15 - ABG 7.318/ 55/ 70/ 28> increase rate to 18, recheck in 1 hour - CXR - VAP prevention protocol/ PPI - PAD protocol for sedation> propofol , prn fent w/ bowel regimen, RASS goal -2 - prn CXR/ ABG - wean FiO2 as able for SpO2 >92%.  Leave PEEP 8 for now> focus on afterload reduction  - s/p lasix  - daily SAT & SBT when appropriate  - prn BD - smoking cessation when appropriate    Acute on chronic  biventricular HF on chronic milrinone  Severe MR > s/p mTEER with torn posterior leaflet Cardiogenic shock CAD/ NICM - inotropes, pressors, IABP per AHF/ structural heart team> remains on milrinone  0.375, goal MAP > 65 (NE currently off), AHF adding nitroprusside for afterload reduction - AHF considering impella - s/p lasix  in cath lab, cont foley - trend PA hemodynamics/ coox/ CVP/ lactic - strict I/Os, wts - heparin  per pharmacy  - stat CBC, CMET, coags, lactic    Hx of PE/ DVT (2015) on coumadin   - hx of IVC filter unable to be removed due to clot burden - heparin  as above   Obesity- BMI 34.87  Hx IDA - prior Fe transfusion 4/25 - CBC now and trend, transfuse per protocol   Best Practice (right click and Reselect all SmartList Selections daily)   Diet/type: NPO; hold TF for now, likely start 6/19 DVT prophylaxis systemic heparin  Pressure ulcer(s): N/A GI prophylaxis: PPI Lines: Central line, Arterial Line, and yes and it is still needed Foley:  Yes, and it is still needed Code Status:  full code Last date of multidisciplinary goals of care discussion [per primary team]  Labs   CBC: Recent Labs  Lab 07/04/23 1057  WBC 3.5*  NEUTROABS 2.3  HGB 12.9  HCT 41.6  MCV 85.6  PLT 188    Basic Metabolic Panel: Recent Labs  Lab 07/04/23 1057  NA 132*  K 3.5  CL 95*  CO2 28  GLUCOSE 138*  BUN 15  CREATININE 0.80  CALCIUM  9.1   GFR: Estimated Creatinine Clearance: 118.2 mL/min (by C-G formula based on SCr of 0.8 mg/dL). Recent Labs  Lab 07/04/23 1057  WBC 3.5*    Liver Function Tests: Recent Labs  Lab 07/04/23 1057  AST 25  ALT 13  ALKPHOS 86  BILITOT 1.6*  PROT 7.5  ALBUMIN 3.2*   No results for input(s): LIPASE, AMYLASE in the last 168 hours. No results for input(s): AMMONIA in the last 168 hours.  ABG    Component Value Date/Time   PHART 7.386 05/14/2023 0408   PCO2ART 70.0 (HH) 05/14/2023 0408   PO2ART 93 05/14/2023 0408   HCO3  29.9 (H) 06/02/2023 1553   HCO3 28.9 (H) 06/02/2023 1553   TCO2 31 06/02/2023 1553   TCO2 30 06/02/2023 1553   O2SAT 69 06/02/2023 1553   O2SAT 67 06/02/2023 1553     Coagulation Profile: Recent Labs  Lab 07/04/23 1057 07/09/23 0648  INR 1.8* 1.3*    Cardiac Enzymes: No results for input(s): CKTOTAL, CKMB, CKMBINDEX, TROPONINI in the last 168 hours.  HbA1C: Hgb A1c MFr Bld  Date/Time Value Ref Range Status  05/08/2023 03:09 AM 6.0 (H) 4.8 - 5.6 % Final    Comment:    (NOTE) Pre diabetes:          5.7%-6.4%  Diabetes:              >  6.4%  Glycemic control for   <7.0% adults with diabetes   09/16/2022 08:20 AM 5.5 4.8 - 5.6 % Final    Comment:             Prediabetes: 5.7 - 6.4          Diabetes: >6.4          Glycemic control for adults with diabetes: <7.0     CBG: No results for input(s): GLUCAP in the last 168 hours.  Review of Systems:   Unable   Past Medical History:  She,  has a past medical history of Anemia, Blood transfusion without reported diagnosis, CAD (coronary artery disease), Closed left ankle fracture, Clotting disorder (HCC), DVT (deep venous thrombosis) (HCC), Fibroid tumor, HFrEF (heart failure with reduced ejection fraction) (HCC), Hidradenitis, Hypertension, Mitral regurgitation, Myocardial infarction (HCC), NICM (nonischemic cardiomyopathy) (HCC), NSVT (nonsustained ventricular tachycardia) (HCC), Obesity, OSA on CPAP, Pulmonary embolism (HCC) (04/2013), and Sleep apnea.   Surgical History:   Past Surgical History:  Procedure Laterality Date   ABSCESS DRAINAGE Bilateral 01/10/2015   BREAST BIOPSY Right 02/01/2020   benign   COLONOSCOPY WITH PROPOFOL  N/A 06/08/2018   Procedure: COLONOSCOPY WITH PROPOFOL ;  Surgeon: Suzette Espy, MD;  Location: [facility];  Service: Endoscopy;  Laterality: N/A;  2:30pm   CYST REMOVAL TRUNK     IR FLUORO GUIDE CV LINE RIGHT  05/16/2023   IR US  GUIDE VASC ACCESS RIGHT  05/16/2023   IVC FILTER  PLACEMENT ([facility] HX)     LOWER EXTREMITY VENOGRAPHY N/A 02/07/2021   Procedure: LOWER EXTREMITY VENOGRAPHY;  Surgeon: Kayla Part, MD;  Location: [facility] INVASIVE CV LAB;  Service: Cardiovascular;  Laterality: N/A;   PERIPHERAL VASCULAR THROMBECTOMY N/A 02/07/2021   Procedure: PERIPHERAL VASCULAR THROMBECTOMY;  Surgeon: Kayla Part, MD;  Location: [facility] INVASIVE CV LAB;  Service: Cardiovascular;  Laterality: N/A;   RIGHT HEART CATH N/A 03/08/2022   Procedure: RIGHT HEART CATH;  Surgeon: Alwin Baars, DO;  Location: [facility] INVASIVE CV LAB;  Service: Cardiovascular;  Laterality: N/A;   RIGHT HEART CATH N/A 10/04/2022   Procedure: RIGHT HEART CATH;  Surgeon: Alwin Baars, DO;  Location: [facility] INVASIVE CV LAB;  Service: Cardiovascular;  Laterality: N/A;   RIGHT HEART CATH N/A 05/13/2023   Procedure: RIGHT HEART CATH;  Surgeon: [name], MD;  Location: [facility] INVASIVE CV LAB;  Service: Cardiovascular;  Laterality: N/A;   RIGHT HEART CATH N/A 06/02/2023   Procedure: RIGHT HEART CATH;  Surgeon: Mardell Shade, MD;  Location: [facility] INVASIVE CV LAB;  Service: Cardiovascular;  Laterality: N/A;   RIGHT/LEFT HEART CATH AND CORONARY ANGIOGRAPHY N/A 06/19/2021   Procedure: RIGHT/LEFT HEART CATH AND CORONARY ANGIOGRAPHY;  Surgeon: Arty Binning, MD;  Location: [facility] INVASIVE CV LAB;  Service: Cardiovascular;  Laterality: N/A;   TEE WITHOUT CARDIOVERSION N/A 03/08/2022   Procedure: TRANSESOPHAGEAL ECHOCARDIOGRAM (TEE);  Surgeon: Alwin Baars, DO;  Location: [facility] ENDOSCOPY;  Service: Cardiovascular;  Laterality: N/A;   TRANSESOPHAGEAL ECHOCARDIOGRAM (CATH LAB) N/A 06/02/2023   Procedure: TRANSESOPHAGEAL ECHOCARDIOGRAM;  Surgeon: Mardell Shade, MD;  Location: [facility] INVASIVE CV LAB;  Service: Cardiovascular;  Laterality: N/A;     Social History:   reports that she has been smoking cigarettes. She has never been exposed to tobacco smoke. She has never used smokeless tobacco. She reports current alcohol   use of about 2.0 standard drinks of alcohol  per week. She reports that she does not use drugs.   Family History:  Her family  history includes Cancer in her maternal grandmother; Diabetes in her paternal aunt and paternal uncle; Heart attack in her paternal grandmother; Hypertension in her mother; Other in her father. There is no history of Colon cancer.   Allergies Allergies  Allergen Reactions   Sulfa  Antibiotics Itching, Swelling and Rash    Lip swelling     Home Medications  Prior to Admission medications   Medication Sig Start Date End Date Taking? Authorizing Provider  albuterol  (VENTOLIN  HFA) 108 (90 Base) MCG/ACT inhaler Inhale 2 puffs into the lungs every 2 (two) hours as needed for wheezing or shortness of breath. 07/09/22  Yes Emokpae, Courage, MD  atorvastatin  (LIPITOR) 20 MG tablet TAKE ONE TABLET BY MOUTH ONCE DAILY. 04/30/23  Yes Branch, Joyceann No, MD  benzonatate  (TESSALON ) 100 MG capsule Take 1 capsule (100 mg total) by mouth 3 (three) times daily as needed for cough. Do not take with alcohol  or while operating or driving heavy machinery 4/0/98  Yes Grooms, [name], PA-C  bumetanide  (BUMEX ) 2 MG tablet Take 1 tablet (2 mg total) by mouth daily. 05/16/23  Yes [name], [name], NP  digoxin  (LANOXIN ) 0.125 MG tablet Take 1 tablet (0.125 mg total) by mouth daily. 05/17/23  Yes [name], [name], NP  enoxaparin  (LOVENOX ) 80 MG/0.8ML injection Inject two 80mg  syringes under the skin once a day 07/04/23  Yes Arnoldo Lapping, MD  milrinone  (PRIMACOR ) 20 MG/100 ML SOLN infusion Inject 0.0318 mg/min into the vein continuous. 05/16/23  Yes [name], [name], NP  NON FORMULARY Pt uses a cpap nightly   Yes [provider]  potassium chloride  SA (KLOR-CON  M) 20 MEQ tablet Take 2 tablets (40 mEq total) by mouth 2 (two) times daily. 05/16/23  Yes [name], [name], NP  spironolactone  (ALDACTONE ) 25 MG tablet Take 2 tablets (50 mg total) by mouth daily. 05/16/23  Yes [name], [name], NP  warfarin (COUMADIN ) 10 MG  tablet Take 10 mg (1 tablet) by mouth daily Monday through Friday, then take 5 mg (1/2 tablet) by mouth on Saturday and on Sunday. 05/16/23   [name], [name], NP     Critical care time: 50 mins       Early Glisson, MSN, AG-ACNP-BC [facility] 07/09/2023, 3:37 PM  See Amion for pager If no response to pager , please call [phone] until 7pm After 7:00 pm call Elink  [phone]

## 2023-07-09 NOTE — Transfer of Care (Signed)
 Immediate Anesthesia Transfer of Care Note  Patient: [name] [name]  Procedure(s) Performed: TRANSCATHETER MITRAL EDGE TO EDGE REPAIR TRANSESOPHAGEAL ECHOCARDIOGRAM IABP Insertion  Patient Location: ICU  Anesthesia Type:General  Level of Consciousness: Patient remains intubated per anesthesia plan  Airway & Oxygen Therapy: Patient remains intubated per anesthesia plan and Patient placed on Ventilator (see vital sign flow sheet for setting)  Post-op Assessment: Report given to staff. VS are as in the OR. 02 still 89%   Post vital signs: Sat 89%  Last Vitals:  Vitals Value Taken Time  BP    Temp    Pulse 0 07/09/23 14:30  Resp    SpO2      Last Pain:  Vitals:   07/09/23 1352  TempSrc:   PainSc: Asleep         Complications:  Encounter Notable Events  Notable Event Outcome Phase Comment  Cardiogenic shock Resolved in Lab Intraprocedure

## 2023-07-09 NOTE — H&P (View-Only) (Signed)
 Reason for Consult:Consider Impella 5.5 LVAD Referring Physician: Dr. [name] is an 48 y.o. female.  HPI: 48 yo woman with complex medical history.  Nonischemic cardiomyopathy, chronic HFrEF with severe MR, DVT, PE, obesity, OSA and pancytopenia.  After most recent hospitalization she was sent home on milrinone .  EF 40-45% in setting of severe MR.  Today she underwent Mitraclip procedure.  Technically difficult and had residual severe MR with cardiogenic shock.  Initially on arrival she had PA pressures 110/70.  Currently down to 70/40 with SNP.  Past Medical History:  Diagnosis Date   Anemia    Blood transfusion without reported diagnosis    CAD (coronary artery disease)    Nonobstructive   Closed left ankle fracture    August 30 2012   Clotting disorder [facility])    DVT (deep venous thrombosis) (HCC)    L leg   Fibroid tumor    HFrEF (heart failure with reduced ejection fraction) (HCC)    Hidradenitis    Hypertension    Mitral regurgitation    Myocardial infarction (HCC)    NICM (nonischemic cardiomyopathy) (HCC)    NSVT (nonsustained ventricular tachycardia) (HCC)    Obesity    OSA on CPAP    Pulmonary embolism (HCC) 04/2013   Sleep apnea     Past Surgical History:  Procedure Laterality Date   ABSCESS DRAINAGE Bilateral 01/10/2015   BREAST BIOPSY Right 02/01/2020   benign   COLONOSCOPY WITH PROPOFOL  N/A 06/08/2018   Procedure: COLONOSCOPY WITH PROPOFOL ;  Surgeon: Suzette Espy, MD;  Location[facility];  Service: Endoscopy;  Laterality: N/A;  2:30pm   CYST REMOVAL TRUNK     IR FLUORO GUIDE CV LINE RIGHT  05/16/2023   IR US  GUIDE VASC ACCESS RIGHT  05/16/2023   IVC FILTER PLACEMENT ([facility] HX)     LOWER EXTREMITY VENOGRAPHY N/A 02/07/2021   Procedure: LOWER EXTREMITY VENOGRAPHY;  Surgeon: Kayla Part, MD;  Location: [facility] INVASIVE CV LAB;  Service: Cardiovascular;  Laterality: N/A;   PERIPHERAL VASCULAR THROMBECTOMY N/A 02/07/2021   Procedure:  PERIPHERAL VASCULAR THROMBECTOMY;  Surgeon: Kayla Part, MD;  Location: [facility] INVASIVE CV LAB;  Service: Cardiovascular;  Laterality: N/A;   RIGHT HEART CATH N/A 03/08/2022   Procedure: RIGHT HEART CATH;  Surgeon: Alwin Baars, DO;  Location: [facility] INVASIVE CV LAB;  Service: Cardiovascular;  Laterality: N/A;   RIGHT HEART CATH N/A 10/04/2022   Procedure: RIGHT HEART CATH;  Surgeon: Alwin Baars, DO;  Location: [facility] INVASIVE CV LAB;  Service: Cardiovascular;  Laterality: N/A;   RIGHT HEART CATH N/A 05/13/2023   Procedure: RIGHT HEART CATH;  Surgeon: [name], MD;  Location: [facility] INVASIVE CV LAB;  Service: Cardiovascular;  Laterality: N/A;   RIGHT HEART CATH N/A 06/02/2023   Procedure: RIGHT HEART CATH;  Surgeon: Mardell Shade, MD;  Location: [facility] INVASIVE CV LAB;  Service: Cardiovascular;  Laterality: N/A;   RIGHT/LEFT HEART CATH AND CORONARY ANGIOGRAPHY N/A 06/19/2021   Procedure: RIGHT/LEFT HEART CATH AND CORONARY ANGIOGRAPHY;  Surgeon: Arty Binning, MD;  Location: [facility] INVASIVE CV LAB;  Service: Cardiovascular;  Laterality: N/A;   TEE WITHOUT CARDIOVERSION N/A 03/08/2022   Procedure: TRANSESOPHAGEAL ECHOCARDIOGRAM (TEE);  Surgeon: Alwin Baars, DO;  Location: [facility];  Service: Cardiovascular;  Laterality: N/A;   TRANSESOPHAGEAL ECHOCARDIOGRAM (CATH LAB) N/A 06/02/2023   Procedure: TRANSESOPHAGEAL ECHOCARDIOGRAM;  Surgeon: Mardell Shade, MD;  Location: [facility] INVASIVE CV LAB;  Service: Cardiovascular;  Laterality: N/A;  Family History  Problem Relation Age of Onset   Hypertension Mother    Diabetes Paternal Aunt    Diabetes Paternal Uncle    Other Father        blood clots   Cancer Maternal Grandmother    Heart attack Paternal Grandmother    Colon cancer Neg Hx     Social History:  reports that she has been smoking cigarettes. She has never been exposed to tobacco smoke. She has never used smokeless tobacco. She reports current alcohol  use of about 2.0  standard drinks of alcohol  per week. She reports that she does not use drugs.  Allergies:  Allergies  Allergen Reactions   Sulfa  Antibiotics Itching, Swelling and Rash    Lip swelling    Medications: Scheduled:  Chlorhexidine  Gluconate Cloth  6 each Topical Daily   docusate  100 mg Per Tube BID   insulin  aspart  0-9 Units Subcutaneous Q4H   mouth rinse  15 mL Mouth Rinse Q2H   pantoprazole  (PROTONIX ) IV  40 mg Intravenous Daily   polyethylene glycol  17 g Per Tube Daily    Results for orders placed or performed during the hospital encounter of 07/09/23 (from the past 48 hours)  Type and screen     Status: None   Collection Time: 07/09/23  6:45 AM  Result Value Ref Range   ABO/RH(D) A POS    Antibody Screen NEG    Sample Expiration      07/12/2023,2359 Performed at [facility], [address], [place], [place] [address]   Protime-INR     Status: Abnormal   Collection Time: 07/09/23  6:48 AM  Result Value Ref Range   Prothrombin Time 16.1 (H) 11.4 - 15.2 seconds   INR 1.3 (H) 0.8 - 1.2    Comment: (NOTE) INR goal varies based on device and disease states. Performed at [facility], [address], [place], [place] [address]   Surgical PCR screen     Status: None   Collection Time: 07/09/23  6:48 AM   Specimen: Nasal Mucosa; Nasal Swab  Result Value Ref Range   MRSA, PCR NEGATIVE NEGATIVE   Staphylococcus aureus NEGATIVE NEGATIVE    Comment: (NOTE) The Xpert SA Assay (FDA approved for NASAL specimens in patients 55 years of age and older), is one component of a comprehensive surveillance program. It is not intended to diagnose infection nor to guide or monitor treatment. Performed at [facility], [address], [place], [place] [address]   Pregnancy, urine POC     Status: None   Collection Time: 07/09/23  7:19 AM  Result Value Ref Range   Preg Test, Ur NEGATIVE NEGATIVE    Comment:        THE SENSITIVITY OF THIS METHODOLOGY IS >24 mIU/mL    POCT Activated clotting time     Status: None   Collection Time: 07/09/23  9:39 AM  Result Value Ref Range   Activated Clotting Time 279 seconds    Comment: Reference range 74-137 seconds for patients not on anticoagulant therapy.  POCT Activated clotting time     Status: None   Collection Time: 07/09/23 10:02 AM  Result Value Ref Range   Activated Clotting Time 273 seconds    Comment: Reference range 74-137 seconds for patients not on anticoagulant therapy.  POCT Activated clotting time     Status: None   Collection Time: 07/09/23 10:26 AM  Result Value Ref Range  Activated Clotting Time 268 seconds    Comment: Reference range 74-137 seconds for patients not on anticoagulant therapy.  POCT Activated clotting time     Status: None   Collection Time: 07/09/23 10:49 AM  Result Value Ref Range   Activated Clotting Time 279 seconds    Comment: Reference range 74-137 seconds for patients not on anticoagulant therapy.  POCT Activated clotting time     Status: None   Collection Time: 07/09/23 11:11 AM  Result Value Ref Range   Activated Clotting Time 262 seconds    Comment: Reference range 74-137 seconds for patients not on anticoagulant therapy.  I-STAT 7, (LYTES, BLD GAS, ICA, H+H)     Status: Abnormal   Collection Time: 07/09/23 11:14 AM  Result Value Ref Range   pH, Arterial 7.355 7.35 - 7.45   pCO2 arterial 49.8 (H) 32 - 48 mmHg   pO2, Arterial 76 (L) 83 - 108 mmHg   Bicarbonate 27.8 20.0 - 28.0 mmol/L   TCO2 29 22 - 32 mmol/L   O2 Saturation 94 %   Acid-Base Excess 1.0 0.0 - 2.0 mmol/L   Sodium 136 135 - 145 mmol/L   Potassium 3.6 3.5 - 5.1 mmol/L   Calcium , Ion 1.18 1.15 - 1.40 mmol/L   HCT 42.0 36.0 - 46.0 %   Hemoglobin 14.3 12.0 - 15.0 g/dL   Sample type ARTERIAL   POCT Activated clotting time     Status: None   Collection Time: 07/09/23 11:40 AM  Result Value Ref Range   Activated Clotting Time 291 seconds    Comment: Reference range 74-137 seconds for patients not  on anticoagulant therapy.  POCT Activated clotting time     Status: None   Collection Time: 07/09/23 12:06 PM  Result Value Ref Range   Activated Clotting Time 262 seconds    Comment: Reference range 74-137 seconds for patients not on anticoagulant therapy.  POCT Activated clotting time     Status: None   Collection Time: 07/09/23 12:32 PM  Result Value Ref Range   Activated Clotting Time 291 seconds    Comment: Reference range 74-137 seconds for patients not on anticoagulant therapy.  I-STAT 7, (LYTES, BLD GAS, ICA, H+H)     Status: Abnormal   Collection Time: 07/09/23 12:51 PM  Result Value Ref Range   pH, Arterial 7.267 (L) 7.35 - 7.45   pCO2 arterial 62.9 (H) 32 - 48 mmHg   pO2, Arterial 69 (L) 83 - 108 mmHg   Bicarbonate 28.7 (H) 20.0 - 28.0 mmol/L   TCO2 31 22 - 32 mmol/L   O2 Saturation 90 %   Acid-Base Excess 0.0 0.0 - 2.0 mmol/L   Sodium 136 135 - 145 mmol/L   Potassium 4.2 3.5 - 5.1 mmol/L   Calcium , Ion 1.17 1.15 - 1.40 mmol/L   HCT 45.0 36.0 - 46.0 %   Hemoglobin 15.3 (H) 12.0 - 15.0 g/dL   Sample type ARTERIAL   I-STAT 7, (LYTES, BLD GAS, ICA, H+H)     Status: Abnormal   Collection Time: 07/09/23  1:40 PM  Result Value Ref Range   pH, Arterial 7.356 7.35 - 7.45   pCO2 arterial 48.8 (H) 32 - 48 mmHg   pO2, Arterial 65 (L) 83 - 108 mmHg   Bicarbonate 27.3 20.0 - 28.0 mmol/L   TCO2 29 22 - 32 mmol/L   O2 Saturation 91 %   Acid-Base Excess 1.0 0.0 - 2.0 mmol/L   Sodium 136 135 - 145  mmol/L   Potassium 4.3 3.5 - 5.1 mmol/L   Calcium , Ion 1.15 1.15 - 1.40 mmol/L   HCT 45.0 36.0 - 46.0 %   Hemoglobin 15.3 (H) 12.0 - 15.0 g/dL   Sample type ARTERIAL   POCT Activated clotting time     Status: None   Collection Time: 07/09/23  1:46 PM  Result Value Ref Range   Activated Clotting Time 233 seconds    Comment: Reference range 74-137 seconds for patients not on anticoagulant therapy.  I-STAT 7, (LYTES, BLD GAS, ICA, H+H)     Status: Abnormal   Collection Time: 07/09/23   1:52 PM  Result Value Ref Range   pH, Arterial 7.341 (L) 7.35 - 7.45   pCO2 arterial 55.2 (H) 32 - 48 mmHg   pO2, Arterial 36 (LL) 83 - 108 mmHg   Bicarbonate 29.8 (H) 20.0 - 28.0 mmol/L   TCO2 31 22 - 32 mmol/L   O2 Saturation 65 %   Acid-Base Excess 3.0 (H) 0.0 - 2.0 mmol/L   Sodium 136 135 - 145 mmol/L   Potassium 4.2 3.5 - 5.1 mmol/L   Calcium , Ion 1.14 (L) 1.15 - 1.40 mmol/L   HCT 44.0 36.0 - 46.0 %   Hemoglobin 15.0 12.0 - 15.0 g/dL   Sample type ARTERIAL   POCT I-Stat EG7     Status: Abnormal   Collection Time: 07/09/23  1:52 PM  Result Value Ref Range   pH, Ven 7.337 7.25 - 7.43   pCO2, Ven 53.7 44 - 60 mmHg   pO2, Ven 36 32 - 45 mmHg   Bicarbonate 28.7 (H) 20.0 - 28.0 mmol/L   TCO2 30 22 - 32 mmol/L   O2 Saturation 64 %   Acid-Base Excess 2.0 0.0 - 2.0 mmol/L   Sodium 136 135 - 145 mmol/L   Potassium 4.2 3.5 - 5.1 mmol/L   Calcium , Ion 1.15 1.15 - 1.40 mmol/L   HCT 45.0 36.0 - 46.0 %   Hemoglobin 15.3 (H) 12.0 - 15.0 g/dL   Sample type VENOUS   Cooxemetry Panel (carboxy, met, total hgb, O2 sat)     Status: Abnormal   Collection Time: 07/09/23  2:40 PM  Result Value Ref Range   Total hemoglobin 13.0 12.0 - 16.0 g/dL   O2 Saturation 16.1 %   Carboxyhemoglobin 4.2 (H) 0.5 - 1.5 %   Methemoglobin <0.7 0.0 - 1.5 %    Comment: Performed at [facility], [address], [place], [place] [address]  CBC with Differential/Platelet     Status: Abnormal   Collection Time: 07/09/23  2:56 PM  Result Value Ref Range   WBC 5.6 4.0 - 10.5 K/uL   RBC 5.13 (H) 3.87 - 5.11 MIL/uL   Hemoglobin 13.5 12.0 - 15.0 g/dL   HCT 54.0 98.1 - 19.1 %   MCV 86.9 80.0 - 100.0 fL   MCH 26.3 26.0 - 34.0 pg   MCHC 30.3 30.0 - 36.0 g/dL   RDW 47.8 (H) 29.5 - 62.1 %   Platelets 218 150 - 400 K/uL   nRBC 0.0 0.0 - 0.2 %   Neutrophils Relative % 92 %   Neutro Abs 5.2 1.7 - 7.7 K/uL   Lymphocytes Relative 6 %   Lymphs Abs 0.3 (L) 0.7 - 4.0 K/uL   Monocytes Relative 2 %   Monocytes  Absolute 0.1 0.1 - 1.0 K/uL   Eosinophils Relative 0 %   Eosinophils Absolute 0.0 0.0 - 0.5 K/uL  Basophils Relative 0 %   Basophils Absolute 0.0 0.0 - 0.1 K/uL   WBC Morphology MORPHOLOGY UNREMARKABLE    RBC Morphology MORPHOLOGY UNREMARKABLE    Smear Review Normal platelet morphology    Immature Granulocytes 0 %   Abs Immature Granulocytes 0.02 0.00 - 0.07 K/uL    Comment: Performed at [facility], [address] [place], [place] [address]  Comprehensive metabolic panel     Status: Abnormal   Collection Time: 07/09/23  2:56 PM  Result Value Ref Range   Sodium 136 135 - 145 mmol/L   Potassium 3.9 3.5 - 5.1 mmol/L   Chloride 97 (L) 98 - 111 mmol/L   CO2 23 22 - 32 mmol/L   Glucose, Bld 149 (H) 70 - 99 mg/dL    Comment: Glucose reference range applies only to samples taken after fasting for at least 8 hours.   BUN 14 6 - 20 mg/dL   Creatinine, Ser 5.78 (H) 0.44 - 1.00 mg/dL   Calcium  9.2 8.9 - 10.3 mg/dL   Total Protein 8.1 6.5 - 8.1 g/dL   Albumin 3.5 3.5 - 5.0 g/dL   AST 31 15 - 41 U/L   ALT 14 0 - 44 U/L   Alkaline Phosphatase 86 38 - 126 U/L   Total Bilirubin 1.6 (H) 0.0 - 1.2 mg/dL   GFR, Estimated 57 (L) >60 mL/min    Comment: (NOTE) Calculated using the CKD-EPI Creatinine Equation (2021)    Anion gap 16 (H) 5 - 15    Comment: Performed at [facility], [address] [place], [place] [address]  Protime-INR     Status: Abnormal   Collection Time: 07/09/23  2:56 PM  Result Value Ref Range   Prothrombin Time 16.2 (H) 11.4 - 15.2 seconds   INR 1.3 (H) 0.8 - 1.2    Comment: (NOTE) INR goal varies based on device and disease states. Performed at [facility], [address], [place], [place] [address]   CG4 I-STAT (Lactic acid)     Status: None   Collection Time: 07/09/23  3:04 PM  Result Value Ref Range   Lactic Acid, Venous 0.9 0.5 - 1.9 mmol/L  I-STAT 7, (LYTES, BLD GAS, ICA, H+H)     Status: Abnormal   Collection Time: 07/09/23  3:07 PM   Result Value Ref Range   pH, Arterial 7.318 (L) 7.35 - 7.45   pCO2 arterial 55.7 (H) 32 - 48 mmHg   pO2, Arterial 70 (L) 83 - 108 mmHg   Bicarbonate 28.5 (H) 20.0 - 28.0 mmol/L   TCO2 30 22 - 32 mmol/L   O2 Saturation 92 %   Acid-Base Excess 1.0 0.0 - 2.0 mmol/L   Sodium 136 135 - 145 mmol/L   Potassium 3.8 3.5 - 5.1 mmol/L   Calcium , Ion 1.12 (L) 1.15 - 1.40 mmol/L   HCT 45.0 36.0 - 46.0 %   Hemoglobin 15.3 (H) 12.0 - 15.0 g/dL   Patient temperature 13.2 C    Sample type ARTERIAL   Glucose, capillary     Status: Abnormal   Collection Time: 07/09/23  4:11 PM  Result Value Ref Range   Glucose-Capillary 177 (H) 70 - 99 mg/dL    Comment: Glucose reference range applies only to samples taken after fasting for at least 8 hours.  I-STAT 7, (LYTES, BLD GAS, ICA, H+H)     Status: Abnormal   Collection Time: 07/09/23  4:25 PM  Result Value Ref  Range   pH, Arterial 7.271 (L) 7.35 - 7.45   pCO2 arterial 61.2 (H) 32 - 48 mmHg   pO2, Arterial 98 83 - 108 mmHg   Bicarbonate 28.4 (H) 20.0 - 28.0 mmol/L   TCO2 30 22 - 32 mmol/L   O2 Saturation 97 %   Acid-Base Excess 0.0 0.0 - 2.0 mmol/L   Sodium 138 135 - 145 mmol/L   Potassium 3.8 3.5 - 5.1 mmol/L   Calcium , Ion 1.13 (L) 1.15 - 1.40 mmol/L   HCT 44.0 36.0 - 46.0 %   Hemoglobin 15.0 12.0 - 15.0 g/dL   Patient temperature 40.9 C    Sample type ARTERIAL   CG4 I-STAT (Lactic acid)     Status: None   Collection Time: 07/09/23  4:39 PM  Result Value Ref Range   Lactic Acid, Venous 1.3 0.5 - 1.9 mmol/L   *Note: Due to a large number of results and/or encounters for the requested time period, some results have not been displayed. A complete set of results can be found in Results Review.    Portable Chest x-ray Result Date: 07/09/2023 CLINICAL DATA:  NG tube intubated EXAM: PORTABLE CHEST 1 VIEW COMPARISON:  07/09/2023, 05/13/2023 FINDINGS: Interval intubation, tip of the endotracheal tube is about 4.8 cm superior to the carina. Right IJ  Swan-Ganz catheter tip over distal right pulmonary artery. Additional right-sided central venous catheter tip, overlies the distal brachiocephalic region. Enteric tube tip at the stomach, side-port in the region of GE junction. Probable intra aortic balloon pump radiopaque marker about 1 cm inferior to expected location of AP window. Presumed cardiac surgical clips. Cardiomegaly with vascular congestion and interim diffuse hazy and ground-glass opacity presumably due to pulmonary edema. Possible small pleural effusions. No pneumothorax is seen. IMPRESSION: 1. Interval intubation, tip of the endotracheal tube about 4.8 cm superior to the carina. Additional lines and tubes as described above. Presumed intra aortic balloon radiopaque marker, about 1 cm inferior to expected location of AP window. Interim placement of clip in the region of mitral valve 2. Cardiomegaly with vascular congestion and interim diffuse hazy and ground-glass opacity consistent with pulmonary edema. Possible small pleural effusions. Electronically Signed   By: Esmeralda Hedge M.D.   On: 07/09/2023 16:32   ECHO TEE Result Date: 07/09/2023    TRANSESOPHOGEAL ECHO REPORT   Patient Name:   [name] [name] Date of Exam: 07/09/2023 Medical Rec #:  [id number]       Height:       70.0 in Accession #:    [id number]      Weight:       243.0 lb Date of Birth:  09/03/1975       BSA:          2.267 m Patient Age:    47 years        BP:           134/97 mmHg Patient Gender: F               HR:           94 bpm. Exam Location:  Inpatient Procedure: Transesophageal Echo, 3D Echo, Color Doppler and Cardiac Doppler            (Both Spectral and Color Flow Doppler were utilized during            procedure). Indications:     Mitral Regurgitation i34.0  History:         Patient has prior  history of Echocardiogram examinations, most                  recent 06/02/2023. CAD, Pulmonary HTN; Risk Factors:Sleep Apnea                  and Hypertension.  Sonographer:     Sherline Distel  Senior RDCS Referring Phys:  [phone] MICHAEL COOPER Diagnosing Phys: Janelle Mediate MD  Sonographer Comments: MitraClip PROCEDURE: After discussion of the risks and benefits of a TEE, an informed consent was obtained from the patient. The transesophogeal probe was passed without difficulty through the esophogus of the patient. Sedation performed by different physician. The patient was monitored while under deep sedation. The patient developed no complications during the procedure.  IMPRESSIONS  1. Left ventricular ejection fraction, by estimation, is 35 to 40%. The left ventricle has moderately decreased function. The left ventricle demonstrates global hypokinesis. The left ventricular internal cavity size was severely dilated.  2. Right ventricular systolic function is normal. The right ventricular size is normal.  3. Left atrial size was severely dilated. No left atrial/left atrial appendage thrombus was detected.  4. Right atrial size was severely dilated.  5. Pre mTEER- severe functional eccentric posteriorly directed MR. PISA radius 1.4 cm, full PV systolic reversal, ERO 0.92 and RV 98 cc.         Post mTEER- single XTW clip initially placed between A2P2. Multiple attempts at placing a 2nd XTW lateral to the first clip was unsuccessful with inability to grasp both anterior and posterior leaflets. Unfortunately despite single clip delivery the degree of MR remained severe. . The mitral valve is abnormal. Severe mitral valve regurgitation. No evidence of mitral stenosis.  6. Tricuspid valve regurgitation is moderate.  7. The aortic valve is tricuspid. Aortic valve regurgitation is not visualized. No aortic stenosis is present.  8. 3D performed of the mitral valve and demonstrates Extensive 3D imaging fo mitral valve performed to guide mTEER.  9. Only left to right shunt post trans septal puncture. FINDINGS  Left Ventricle: Left ventricular ejection fraction, by estimation, is 35 to 40%. The left ventricle has  moderately decreased function. The left ventricle demonstrates global hypokinesis. The left ventricular internal cavity size was severely dilated. There is no left ventricular hypertrophy. Right Ventricle: The right ventricular size is normal. Right vetricular wall thickness was not assessed. Right ventricular systolic function is normal. Left Atrium: Left atrial size was severely dilated. No left atrial/left atrial appendage thrombus was detected. Right Atrium: Right atrial size was severely dilated. Pericardium: There is no evidence of pericardial effusion. Mitral Valve: Pre mTEER- severe functional eccentric posteriorly directed MR. PISA radius 1.4 cm, full PV systolic reversal, ERO 0.92 and RV 98 cc. Post mTEER- single XTW clip initially placed between A2P2. Multiple attempts at placing a 2nd XTW lateral to the first clip was unsuccessful with inability to grasp both anterior and posterior leaflets. Unfortunately despite single clip delivery the degree of MR remained severe. The mitral valve is abnormal. Severe mitral valve regurgitation. No evidence of mitral valve stenosis. Tricuspid Valve: The tricuspid valve is normal in structure. Tricuspid valve regurgitation is moderate. Aortic Valve: The aortic valve is tricuspid. Aortic valve regurgitation is not visualized. No aortic stenosis is present. Pulmonic Valve: The pulmonic valve was normal in structure. Pulmonic valve regurgitation is mild. Aorta: Aortic root could not be assessed. IAS/Shunts: Only left to right shunt post trans septal puncture. Additional Comments: 3D was performed not requiring image post processing on an  independent workstation and was abnormal. Spectral Doppler performed. Janelle Mediate MD Electronically signed by Janelle Mediate MD Signature Date/Time: 07/09/2023/2:43:12 PM    Final    EP STUDY Result Date: 07/09/2023 See surgical note for result.  Structural Heart Procedure Result Date: 07/09/2023 See surgical note for  result.  CARDIAC CATHETERIZATION Result Date: 07/09/2023 See surgical note for result.  DG Chest 2 View Result Date: 07/09/2023 CLINICAL DATA:  Preop chest exam prior to mitral valve repair. EXAM: CHEST - 2 VIEW COMPARISON:  05/13/2023 FINDINGS: Unchanged mild cardiomegaly and mild pulmonary vascular congestion. Lungs are clear. Interval retraction of RIGHT IJ tunneled catheter with tip now terminating in the region of the RIGHT brachiocephalic vein. IMPRESSION: 1. Unchanged cardiomegaly and mild pulmonary vascular congestion. 2. Retracted tunneled RIGHT IJ catheter. Electronically Signed   By: Elester Grim M.D.   On: 07/09/2023 09:07    Review of Systems  Unable to perform ROS: Intubated   Blood pressure (!) 134/97, pulse (!) 216, temperature (!) 97.2 F (36.2 C), resp. rate (!) 22, height 5' 10 (1.778 m), weight 110.2 kg, SpO2 98%. Physical Exam Vitals reviewed.  Constitutional:      Appearance: She is ill-appearing.     Comments: intubated   Cardiovascular:     Rate and Rhythm: Tachycardia present.     Heart sounds: Murmur heard.  Pulmonary:     Comments: Clear anteriorly on ventilator  Skin:    General: Skin is warm and dry.   Neurological:     Comments: sedated    Assessment/Plan: Critically ill 48 year old woman s/p mTEER with severe residual MR and cardiogenic shock.  Currently has stabilized with IABP, milrinone , SNP, and Lasix  drips.  CI currently 4.2  Likely will need Impella 5.5 for temporary LV support.  Will review with AHF team in AM  [name] [name] 07/09/2023, 5:38 PM

## 2023-07-09 NOTE — Interval H&P Note (Signed)
 History and Physical Interval Note:  07/09/2023 6:39 AM  [name] [name]  has presented today for surgery, with the diagnosis of severe mitral insufficiency.  The various methods of treatment have been discussed with the patient and family. After consideration of risks, benefits and other options for treatment, the patient has consented to  Procedure(s): TRANSCATHETER MITRAL EDGE TO EDGE REPAIR (N/A) TRANSESOPHAGEAL ECHOCARDIOGRAM (N/A) as a surgical intervention.  The patient's history has been reviewed, patient examined, no change in status, stable for surgery.  I have reviewed the patient's chart and labs.  Questions were answered to the patient's satisfaction.     Arnoldo Lapping

## 2023-07-09 NOTE — Anesthesia Procedure Notes (Signed)
 Procedure Name: Intubation Date/Time: 07/09/2023 8:59 AM  Performed by: Bennett Brass, CRNAPre-anesthesia Checklist: Patient identified, Emergency Drugs available, Suction available and Patient being monitored Patient Re-evaluated:Patient Re-evaluated prior to induction Oxygen Delivery Method: Circle system utilized Preoxygenation: Pre-oxygenation with 100% oxygen Induction Type: IV induction Ventilation: Mask ventilation without difficulty Laryngoscope Size: Miller and 2 Grade View: Grade I Tube type: Oral Tube size: 7.0 mm Number of attempts: 1 Airway Equipment and Method: Stylet and Oral airway Placement Confirmation: ETT inserted through vocal cords under direct vision, positive ETCO2 and breath sounds checked- equal and bilateral Secured at: 22 cm Tube secured with: Tape Dental Injury: Teeth and Oropharynx as per pre-operative assessment

## 2023-07-09 NOTE — Anesthesia Preprocedure Evaluation (Addendum)
 Anesthesia Evaluation  Patient identified by MRN, date of birth, ID band Patient awake    Reviewed: Allergy & Precautions, NPO status , Patient's Chart, lab work & pertinent test results  Airway Mallampati: II  TM Distance: >3 FB Neck ROM: Full    Dental no notable dental hx.    Pulmonary sleep apnea and Continuous Positive Airway Pressure Ventilation , Current Smoker and Patient abstained from smoking., PE   Pulmonary exam normal        Cardiovascular hypertension, + CAD, + Past MI and +CHF  + dysrhythmias Atrial Fibrillation + Valvular Problems/Murmurs MR  Rhythm:Regular Rate:Normal + Systolic murmurs IMPRESSIONS   1. Left ventricular ejection fraction, by estimation, is 40 to 45%. The left ventricle has mildly decreased function.  2. Right ventricular systolic function is moderately reduced. The right ventricular size is normal.  3. Left atrial size was severely dilated. No left atrial/left atrial appendage thrombus was detected.  4. Right atrial size was severely dilated.  5. Posterior leaflet restriction with severe posterior MR and systolic flow reversal in pulmonary veins. PISA 1.3 cm. MR volume estimated at 93ml with ERO 0.82cm2. The mitral valve is abnormal. Severe mitral valve regurgitation.  6. The aortic valve is normal in structure. Aortic valve regurgitation is not visualized.    Neuro/Psych negative neurological ROS  negative psych ROS   GI/Hepatic negative GI ROS, Neg liver ROS,,,  Endo/Other  negative endocrine ROS    Renal/GU negative Renal ROS  negative genitourinary   Musculoskeletal   Abdominal Normal abdominal exam  (+)   Peds  Hematology  (+) Blood dyscrasia, anemia Lab Results      Component                Value               Date                      WBC                      3.5 (L)             07/04/2023                HGB                      12.9                07/04/2023                HCT                       41.6                07/04/2023                MCV                      85.6                07/04/2023                PLT                      188                 07/04/2023  Lab Results      Component                Value               Date                      NA                       132 (L)             07/04/2023                K                        3.5                 07/04/2023                CO2                      28                  07/04/2023                GLUCOSE                  138 (H)             07/04/2023                BUN                      15                  07/04/2023                CREATININE               0.80                07/04/2023                CALCIUM                   9.1                 07/04/2023                EGFR                     66                  09/16/2022                GFRNONAA                 >60                 07/04/2023              Anesthesia Other Findings   Reproductive/Obstetrics                             Anesthesia Physical Anesthesia Plan  ASA: 3  Anesthesia Plan: General   Post-op Pain Management:    Induction: Intravenous  PONV Risk Score and Plan: 2 and Ondansetron , Dexamethasone, Midazolam  and Treatment may  vary due to age or medical condition  Airway Management Planned: Mask and Oral ETT  Additional Equipment: Arterial line  Intra-op Plan:   Post-operative Plan: Extubation in OR  Informed Consent: I have reviewed the patients History and Physical, chart, labs and discussed the procedure including the risks, benefits and alternatives for the proposed anesthesia with the patient or authorized representative who has indicated his/her understanding and acceptance.     Dental advisory given  Plan Discussed with: CRNA  Anesthesia Plan Comments:        Anesthesia Quick Evaluation

## 2023-07-09 NOTE — Consult Note (Signed)
 Reason for Consult:Consider Impella 5.5 LVAD Referring Physician: Dr. Lucie Ruts is an 48 y.o. female.  HPI: 48 yo woman with complex medical history.  Nonischemic cardiomyopathy, chronic HFrEF with severe MR, DVT, PE, obesity, OSA and pancytopenia.  After most recent hospitalization she was sent home on milrinone .  EF 40-45% in setting of severe MR.  Today she underwent Mitraclip procedure.  Technically difficult and had residual severe MR with cardiogenic shock.  Initially on arrival she had PA pressures 110/70.  Currently down to 70/40 with SNP.  Past Medical History:  Diagnosis Date   Anemia    Blood transfusion without reported diagnosis    CAD (coronary artery disease)    Nonobstructive   Closed left ankle fracture    August 30 2012   Clotting disorder [facility])    DVT (deep venous thrombosis) (HCC)    L leg   Fibroid tumor    HFrEF (heart failure with reduced ejection fraction) (HCC)    Hidradenitis    Hypertension    Mitral regurgitation    Myocardial infarction (HCC)    NICM (nonischemic cardiomyopathy) (HCC)    NSVT (nonsustained ventricular tachycardia) (HCC)    Obesity    OSA on CPAP    Pulmonary embolism (HCC) 04/2013   Sleep apnea     Past Surgical History:  Procedure Laterality Date   ABSCESS DRAINAGE Bilateral 01/10/2015   BREAST BIOPSY Right 02/01/2020   benign   COLONOSCOPY WITH PROPOFOL  N/A 06/08/2018   Procedure: COLONOSCOPY WITH PROPOFOL ;  Surgeon: Suzette Espy, MD;  Location[facility];  Service: Endoscopy;  Laterality: N/A;  2:30pm   CYST REMOVAL TRUNK     IR FLUORO GUIDE CV LINE RIGHT  05/16/2023   IR US  GUIDE VASC ACCESS RIGHT  05/16/2023   IVC FILTER PLACEMENT ([facility] HX)     LOWER EXTREMITY VENOGRAPHY N/A 02/07/2021   Procedure: LOWER EXTREMITY VENOGRAPHY;  Surgeon: Kayla Part, MD;  Location: [facility] INVASIVE CV LAB;  Service: Cardiovascular;  Laterality: N/A;   PERIPHERAL VASCULAR THROMBECTOMY N/A 02/07/2021   Procedure:  PERIPHERAL VASCULAR THROMBECTOMY;  Surgeon: Kayla Part, MD;  Location: [facility] INVASIVE CV LAB;  Service: Cardiovascular;  Laterality: N/A;   RIGHT HEART CATH N/A 03/08/2022   Procedure: RIGHT HEART CATH;  Surgeon: Alwin Baars, DO;  Location: [facility] INVASIVE CV LAB;  Service: Cardiovascular;  Laterality: N/A;   RIGHT HEART CATH N/A 10/04/2022   Procedure: RIGHT HEART CATH;  Surgeon: Alwin Baars, DO;  Location: [facility] INVASIVE CV LAB;  Service: Cardiovascular;  Laterality: N/A;   RIGHT HEART CATH N/A 05/13/2023   Procedure: RIGHT HEART CATH;  Surgeon: [name], MD;  Location: [facility] INVASIVE CV LAB;  Service: Cardiovascular;  Laterality: N/A;   RIGHT HEART CATH N/A 06/02/2023   Procedure: RIGHT HEART CATH;  Surgeon: Mardell Shade, MD;  Location: [facility] INVASIVE CV LAB;  Service: Cardiovascular;  Laterality: N/A;   RIGHT/LEFT HEART CATH AND CORONARY ANGIOGRAPHY N/A 06/19/2021   Procedure: RIGHT/LEFT HEART CATH AND CORONARY ANGIOGRAPHY;  Surgeon: Arty Binning, MD;  Location: [facility] INVASIVE CV LAB;  Service: Cardiovascular;  Laterality: N/A;   TEE WITHOUT CARDIOVERSION N/A 03/08/2022   Procedure: TRANSESOPHAGEAL ECHOCARDIOGRAM (TEE);  Surgeon: Alwin Baars, DO;  Location: [facility];  Service: Cardiovascular;  Laterality: N/A;   TRANSESOPHAGEAL ECHOCARDIOGRAM (CATH LAB) N/A 06/02/2023   Procedure: TRANSESOPHAGEAL ECHOCARDIOGRAM;  Surgeon: Mardell Shade, MD;  Location: [facility] INVASIVE CV LAB;  Service: Cardiovascular;  Laterality: N/A;  Family History  Problem Relation Age of Onset   Hypertension Mother    Diabetes Paternal Aunt    Diabetes Paternal Uncle    Other Father        blood clots   Cancer Maternal Grandmother    Heart attack Paternal Grandmother    Colon cancer Neg Hx     Social History:  reports that she has been smoking cigarettes. She has never been exposed to tobacco smoke. She has never used smokeless tobacco. She reports current alcohol  use of about 2.0  standard drinks of alcohol  per week. She reports that she does not use drugs.  Allergies:  Allergies  Allergen Reactions   Sulfa  Antibiotics Itching, Swelling and Rash    Lip swelling    Medications: Scheduled:  Chlorhexidine  Gluconate Cloth  6 each Topical Daily   docusate  100 mg Per Tube BID   insulin  aspart  0-9 Units Subcutaneous Q4H   mouth rinse  15 mL Mouth Rinse Q2H   pantoprazole  (PROTONIX ) IV  40 mg Intravenous Daily   polyethylene glycol  17 g Per Tube Daily    Results for orders placed or performed during the hospital encounter of 07/09/23 (from the past 48 hours)  Type and screen     Status: None   Collection Time: 07/09/23  6:45 AM  Result Value Ref Range   ABO/RH(D) A POS    Antibody Screen NEG    Sample Expiration      07/12/2023,2359 Performed at [facility], [address], [place], [place] [address]   Protime-INR     Status: Abnormal   Collection Time: 07/09/23  6:48 AM  Result Value Ref Range   Prothrombin Time 16.1 (H) 11.4 - 15.2 seconds   INR 1.3 (H) 0.8 - 1.2    Comment: (NOTE) INR goal varies based on device and disease states. Performed at [facility], [address], [place], [place] [address]   Surgical PCR screen     Status: None   Collection Time: 07/09/23  6:48 AM   Specimen: Nasal Mucosa; Nasal Swab  Result Value Ref Range   MRSA, PCR NEGATIVE NEGATIVE   Staphylococcus aureus NEGATIVE NEGATIVE    Comment: (NOTE) The Xpert SA Assay (FDA approved for NASAL specimens in patients 55 years of age and older), is one component of a comprehensive surveillance program. It is not intended to diagnose infection nor to guide or monitor treatment. Performed at [facility], [address], [place], [place] [address]   Pregnancy, urine POC     Status: None   Collection Time: 07/09/23  7:19 AM  Result Value Ref Range   Preg Test, Ur NEGATIVE NEGATIVE    Comment:        THE SENSITIVITY OF THIS METHODOLOGY IS >24 mIU/mL    POCT Activated clotting time     Status: None   Collection Time: 07/09/23  9:39 AM  Result Value Ref Range   Activated Clotting Time 279 seconds    Comment: Reference range 74-137 seconds for patients not on anticoagulant therapy.  POCT Activated clotting time     Status: None   Collection Time: 07/09/23 10:02 AM  Result Value Ref Range   Activated Clotting Time 273 seconds    Comment: Reference range 74-137 seconds for patients not on anticoagulant therapy.  POCT Activated clotting time     Status: None   Collection Time: 07/09/23 10:26 AM  Result Value Ref Range  Activated Clotting Time 268 seconds    Comment: Reference range 74-137 seconds for patients not on anticoagulant therapy.  POCT Activated clotting time     Status: None   Collection Time: 07/09/23 10:49 AM  Result Value Ref Range   Activated Clotting Time 279 seconds    Comment: Reference range 74-137 seconds for patients not on anticoagulant therapy.  POCT Activated clotting time     Status: None   Collection Time: 07/09/23 11:11 AM  Result Value Ref Range   Activated Clotting Time 262 seconds    Comment: Reference range 74-137 seconds for patients not on anticoagulant therapy.  I-STAT 7, (LYTES, BLD GAS, ICA, H+H)     Status: Abnormal   Collection Time: 07/09/23 11:14 AM  Result Value Ref Range   pH, Arterial 7.355 7.35 - 7.45   pCO2 arterial 49.8 (H) 32 - 48 mmHg   pO2, Arterial 76 (L) 83 - 108 mmHg   Bicarbonate 27.8 20.0 - 28.0 mmol/L   TCO2 29 22 - 32 mmol/L   O2 Saturation 94 %   Acid-Base Excess 1.0 0.0 - 2.0 mmol/L   Sodium 136 135 - 145 mmol/L   Potassium 3.6 3.5 - 5.1 mmol/L   Calcium , Ion 1.18 1.15 - 1.40 mmol/L   HCT 42.0 36.0 - 46.0 %   Hemoglobin 14.3 12.0 - 15.0 g/dL   Sample type ARTERIAL   POCT Activated clotting time     Status: None   Collection Time: 07/09/23 11:40 AM  Result Value Ref Range   Activated Clotting Time 291 seconds    Comment: Reference range 74-137 seconds for patients not  on anticoagulant therapy.  POCT Activated clotting time     Status: None   Collection Time: 07/09/23 12:06 PM  Result Value Ref Range   Activated Clotting Time 262 seconds    Comment: Reference range 74-137 seconds for patients not on anticoagulant therapy.  POCT Activated clotting time     Status: None   Collection Time: 07/09/23 12:32 PM  Result Value Ref Range   Activated Clotting Time 291 seconds    Comment: Reference range 74-137 seconds for patients not on anticoagulant therapy.  I-STAT 7, (LYTES, BLD GAS, ICA, H+H)     Status: Abnormal   Collection Time: 07/09/23 12:51 PM  Result Value Ref Range   pH, Arterial 7.267 (L) 7.35 - 7.45   pCO2 arterial 62.9 (H) 32 - 48 mmHg   pO2, Arterial 69 (L) 83 - 108 mmHg   Bicarbonate 28.7 (H) 20.0 - 28.0 mmol/L   TCO2 31 22 - 32 mmol/L   O2 Saturation 90 %   Acid-Base Excess 0.0 0.0 - 2.0 mmol/L   Sodium 136 135 - 145 mmol/L   Potassium 4.2 3.5 - 5.1 mmol/L   Calcium , Ion 1.17 1.15 - 1.40 mmol/L   HCT 45.0 36.0 - 46.0 %   Hemoglobin 15.3 (H) 12.0 - 15.0 g/dL   Sample type ARTERIAL   I-STAT 7, (LYTES, BLD GAS, ICA, H+H)     Status: Abnormal   Collection Time: 07/09/23  1:40 PM  Result Value Ref Range   pH, Arterial 7.356 7.35 - 7.45   pCO2 arterial 48.8 (H) 32 - 48 mmHg   pO2, Arterial 65 (L) 83 - 108 mmHg   Bicarbonate 27.3 20.0 - 28.0 mmol/L   TCO2 29 22 - 32 mmol/L   O2 Saturation 91 %   Acid-Base Excess 1.0 0.0 - 2.0 mmol/L   Sodium 136 135 - 145  mmol/L   Potassium 4.3 3.5 - 5.1 mmol/L   Calcium , Ion 1.15 1.15 - 1.40 mmol/L   HCT 45.0 36.0 - 46.0 %   Hemoglobin 15.3 (H) 12.0 - 15.0 g/dL   Sample type ARTERIAL   POCT Activated clotting time     Status: None   Collection Time: 07/09/23  1:46 PM  Result Value Ref Range   Activated Clotting Time 233 seconds    Comment: Reference range 74-137 seconds for patients not on anticoagulant therapy.  I-STAT 7, (LYTES, BLD GAS, ICA, H+H)     Status: Abnormal   Collection Time: 07/09/23   1:52 PM  Result Value Ref Range   pH, Arterial 7.341 (L) 7.35 - 7.45   pCO2 arterial 55.2 (H) 32 - 48 mmHg   pO2, Arterial 36 (LL) 83 - 108 mmHg   Bicarbonate 29.8 (H) 20.0 - 28.0 mmol/L   TCO2 31 22 - 32 mmol/L   O2 Saturation 65 %   Acid-Base Excess 3.0 (H) 0.0 - 2.0 mmol/L   Sodium 136 135 - 145 mmol/L   Potassium 4.2 3.5 - 5.1 mmol/L   Calcium , Ion 1.14 (L) 1.15 - 1.40 mmol/L   HCT 44.0 36.0 - 46.0 %   Hemoglobin 15.0 12.0 - 15.0 g/dL   Sample type ARTERIAL   POCT I-Stat EG7     Status: Abnormal   Collection Time: 07/09/23  1:52 PM  Result Value Ref Range   pH, Ven 7.337 7.25 - 7.43   pCO2, Ven 53.7 44 - 60 mmHg   pO2, Ven 36 32 - 45 mmHg   Bicarbonate 28.7 (H) 20.0 - 28.0 mmol/L   TCO2 30 22 - 32 mmol/L   O2 Saturation 64 %   Acid-Base Excess 2.0 0.0 - 2.0 mmol/L   Sodium 136 135 - 145 mmol/L   Potassium 4.2 3.5 - 5.1 mmol/L   Calcium , Ion 1.15 1.15 - 1.40 mmol/L   HCT 45.0 36.0 - 46.0 %   Hemoglobin 15.3 (H) 12.0 - 15.0 g/dL   Sample type VENOUS   Cooxemetry Panel (carboxy, met, total hgb, O2 sat)     Status: Abnormal   Collection Time: 07/09/23  2:40 PM  Result Value Ref Range   Total hemoglobin 13.0 12.0 - 16.0 g/dL   O2 Saturation 16.1 %   Carboxyhemoglobin 4.2 (H) 0.5 - 1.5 %   Methemoglobin <0.7 0.0 - 1.5 %    Comment: Performed at [facility], [address], [place], [place] [address]  CBC with Differential/Platelet     Status: Abnormal   Collection Time: 07/09/23  2:56 PM  Result Value Ref Range   WBC 5.6 4.0 - 10.5 K/uL   RBC 5.13 (H) 3.87 - 5.11 MIL/uL   Hemoglobin 13.5 12.0 - 15.0 g/dL   HCT 54.0 98.1 - 19.1 %   MCV 86.9 80.0 - 100.0 fL   MCH 26.3 26.0 - 34.0 pg   MCHC 30.3 30.0 - 36.0 g/dL   RDW 47.8 (H) 29.5 - 62.1 %   Platelets 218 150 - 400 K/uL   nRBC 0.0 0.0 - 0.2 %   Neutrophils Relative % 92 %   Neutro Abs 5.2 1.7 - 7.7 K/uL   Lymphocytes Relative 6 %   Lymphs Abs 0.3 (L) 0.7 - 4.0 K/uL   Monocytes Relative 2 %   Monocytes  Absolute 0.1 0.1 - 1.0 K/uL   Eosinophils Relative 0 %   Eosinophils Absolute 0.0 0.0 - 0.5 K/uL  Basophils Relative 0 %   Basophils Absolute 0.0 0.0 - 0.1 K/uL   WBC Morphology MORPHOLOGY UNREMARKABLE    RBC Morphology MORPHOLOGY UNREMARKABLE    Smear Review Normal platelet morphology    Immature Granulocytes 0 %   Abs Immature Granulocytes 0.02 0.00 - 0.07 K/uL    Comment: Performed at [facility], [address] [place], [place] [address]  Comprehensive metabolic panel     Status: Abnormal   Collection Time: 07/09/23  2:56 PM  Result Value Ref Range   Sodium 136 135 - 145 mmol/L   Potassium 3.9 3.5 - 5.1 mmol/L   Chloride 97 (L) 98 - 111 mmol/L   CO2 23 22 - 32 mmol/L   Glucose, Bld 149 (H) 70 - 99 mg/dL    Comment: Glucose reference range applies only to samples taken after fasting for at least 8 hours.   BUN 14 6 - 20 mg/dL   Creatinine, Ser 5.78 (H) 0.44 - 1.00 mg/dL   Calcium  9.2 8.9 - 10.3 mg/dL   Total Protein 8.1 6.5 - 8.1 g/dL   Albumin 3.5 3.5 - 5.0 g/dL   AST 31 15 - 41 U/L   ALT 14 0 - 44 U/L   Alkaline Phosphatase 86 38 - 126 U/L   Total Bilirubin 1.6 (H) 0.0 - 1.2 mg/dL   GFR, Estimated 57 (L) >60 mL/min    Comment: (NOTE) Calculated using the CKD-EPI Creatinine Equation (2021)    Anion gap 16 (H) 5 - 15    Comment: Performed at [facility], [address] [place], [place] [address]  Protime-INR     Status: Abnormal   Collection Time: 07/09/23  2:56 PM  Result Value Ref Range   Prothrombin Time 16.2 (H) 11.4 - 15.2 seconds   INR 1.3 (H) 0.8 - 1.2    Comment: (NOTE) INR goal varies based on device and disease states. Performed at [facility], [address], [place], [place] [address]   CG4 I-STAT (Lactic acid)     Status: None   Collection Time: 07/09/23  3:04 PM  Result Value Ref Range   Lactic Acid, Venous 0.9 0.5 - 1.9 mmol/L  I-STAT 7, (LYTES, BLD GAS, ICA, H+H)     Status: Abnormal   Collection Time: 07/09/23  3:07 PM   Result Value Ref Range   pH, Arterial 7.318 (L) 7.35 - 7.45   pCO2 arterial 55.7 (H) 32 - 48 mmHg   pO2, Arterial 70 (L) 83 - 108 mmHg   Bicarbonate 28.5 (H) 20.0 - 28.0 mmol/L   TCO2 30 22 - 32 mmol/L   O2 Saturation 92 %   Acid-Base Excess 1.0 0.0 - 2.0 mmol/L   Sodium 136 135 - 145 mmol/L   Potassium 3.8 3.5 - 5.1 mmol/L   Calcium , Ion 1.12 (L) 1.15 - 1.40 mmol/L   HCT 45.0 36.0 - 46.0 %   Hemoglobin 15.3 (H) 12.0 - 15.0 g/dL   Patient temperature 13.2 C    Sample type ARTERIAL   Glucose, capillary     Status: Abnormal   Collection Time: 07/09/23  4:11 PM  Result Value Ref Range   Glucose-Capillary 177 (H) 70 - 99 mg/dL    Comment: Glucose reference range applies only to samples taken after fasting for at least 8 hours.  I-STAT 7, (LYTES, BLD GAS, ICA, H+H)     Status: Abnormal   Collection Time: 07/09/23  4:25 PM  Result Value Ref  Range   pH, Arterial 7.271 (L) 7.35 - 7.45   pCO2 arterial 61.2 (H) 32 - 48 mmHg   pO2, Arterial 98 83 - 108 mmHg   Bicarbonate 28.4 (H) 20.0 - 28.0 mmol/L   TCO2 30 22 - 32 mmol/L   O2 Saturation 97 %   Acid-Base Excess 0.0 0.0 - 2.0 mmol/L   Sodium 138 135 - 145 mmol/L   Potassium 3.8 3.5 - 5.1 mmol/L   Calcium , Ion 1.13 (L) 1.15 - 1.40 mmol/L   HCT 44.0 36.0 - 46.0 %   Hemoglobin 15.0 12.0 - 15.0 g/dL   Patient temperature 40.9 C    Sample type ARTERIAL   CG4 I-STAT (Lactic acid)     Status: None   Collection Time: 07/09/23  4:39 PM  Result Value Ref Range   Lactic Acid, Venous 1.3 0.5 - 1.9 mmol/L   *Note: Due to a large number of results and/or encounters for the requested time period, some results have not been displayed. A complete set of results can be found in Results Review.    Portable Chest x-ray Result Date: 07/09/2023 CLINICAL DATA:  NG tube intubated EXAM: PORTABLE CHEST 1 VIEW COMPARISON:  07/09/2023, 05/13/2023 FINDINGS: Interval intubation, tip of the endotracheal tube is about 4.8 cm superior to the carina. Right IJ  Swan-Ganz catheter tip over distal right pulmonary artery. Additional right-sided central venous catheter tip, overlies the distal brachiocephalic region. Enteric tube tip at the stomach, side-port in the region of GE junction. Probable intra aortic balloon pump radiopaque marker about 1 cm inferior to expected location of AP window. Presumed cardiac surgical clips. Cardiomegaly with vascular congestion and interim diffuse hazy and ground-glass opacity presumably due to pulmonary edema. Possible small pleural effusions. No pneumothorax is seen. IMPRESSION: 1. Interval intubation, tip of the endotracheal tube about 4.8 cm superior to the carina. Additional lines and tubes as described above. Presumed intra aortic balloon radiopaque marker, about 1 cm inferior to expected location of AP window. Interim placement of clip in the region of mitral valve 2. Cardiomegaly with vascular congestion and interim diffuse hazy and ground-glass opacity consistent with pulmonary edema. Possible small pleural effusions. Electronically Signed   By: Esmeralda Hedge M.D.   On: 07/09/2023 16:32   ECHO TEE Result Date: 07/09/2023    TRANSESOPHOGEAL ECHO REPORT   Patient Name:   TAESHA GOODELL Date of Exam: 07/09/2023 Medical Rec #:  [id number]       Height:       70.0 in Accession #:    [id number]      Weight:       243.0 lb Date of Birth:  09/03/1975       BSA:          2.267 m Patient Age:    47 years        BP:           134/97 mmHg Patient Gender: F               HR:           94 bpm. Exam Location:  Inpatient Procedure: Transesophageal Echo, 3D Echo, Color Doppler and Cardiac Doppler            (Both Spectral and Color Flow Doppler were utilized during            procedure). Indications:     Mitral Regurgitation i34.0  History:         Patient has prior  history of Echocardiogram examinations, most                  recent 06/02/2023. CAD, Pulmonary HTN; Risk Factors:Sleep Apnea                  and Hypertension.  Sonographer:     Sherline Distel  Senior RDCS Referring Phys:  [phone] MICHAEL COOPER Diagnosing Phys: Janelle Mediate MD  Sonographer Comments: MitraClip PROCEDURE: After discussion of the risks and benefits of a TEE, an informed consent was obtained from the patient. The transesophogeal probe was passed without difficulty through the esophogus of the patient. Sedation performed by different physician. The patient was monitored while under deep sedation. The patient developed no complications during the procedure.  IMPRESSIONS  1. Left ventricular ejection fraction, by estimation, is 35 to 40%. The left ventricle has moderately decreased function. The left ventricle demonstrates global hypokinesis. The left ventricular internal cavity size was severely dilated.  2. Right ventricular systolic function is normal. The right ventricular size is normal.  3. Left atrial size was severely dilated. No left atrial/left atrial appendage thrombus was detected.  4. Right atrial size was severely dilated.  5. Pre mTEER- severe functional eccentric posteriorly directed MR. PISA radius 1.4 cm, full PV systolic reversal, ERO 0.92 and RV 98 cc.         Post mTEER- single XTW clip initially placed between A2P2. Multiple attempts at placing a 2nd XTW lateral to the first clip was unsuccessful with inability to grasp both anterior and posterior leaflets. Unfortunately despite single clip delivery the degree of MR remained severe. . The mitral valve is abnormal. Severe mitral valve regurgitation. No evidence of mitral stenosis.  6. Tricuspid valve regurgitation is moderate.  7. The aortic valve is tricuspid. Aortic valve regurgitation is not visualized. No aortic stenosis is present.  8. 3D performed of the mitral valve and demonstrates Extensive 3D imaging fo mitral valve performed to guide mTEER.  9. Only left to right shunt post trans septal puncture. FINDINGS  Left Ventricle: Left ventricular ejection fraction, by estimation, is 35 to 40%. The left ventricle has  moderately decreased function. The left ventricle demonstrates global hypokinesis. The left ventricular internal cavity size was severely dilated. There is no left ventricular hypertrophy. Right Ventricle: The right ventricular size is normal. Right vetricular wall thickness was not assessed. Right ventricular systolic function is normal. Left Atrium: Left atrial size was severely dilated. No left atrial/left atrial appendage thrombus was detected. Right Atrium: Right atrial size was severely dilated. Pericardium: There is no evidence of pericardial effusion. Mitral Valve: Pre mTEER- severe functional eccentric posteriorly directed MR. PISA radius 1.4 cm, full PV systolic reversal, ERO 0.92 and RV 98 cc. Post mTEER- single XTW clip initially placed between A2P2. Multiple attempts at placing a 2nd XTW lateral to the first clip was unsuccessful with inability to grasp both anterior and posterior leaflets. Unfortunately despite single clip delivery the degree of MR remained severe. The mitral valve is abnormal. Severe mitral valve regurgitation. No evidence of mitral valve stenosis. Tricuspid Valve: The tricuspid valve is normal in structure. Tricuspid valve regurgitation is moderate. Aortic Valve: The aortic valve is tricuspid. Aortic valve regurgitation is not visualized. No aortic stenosis is present. Pulmonic Valve: The pulmonic valve was normal in structure. Pulmonic valve regurgitation is mild. Aorta: Aortic root could not be assessed. IAS/Shunts: Only left to right shunt post trans septal puncture. Additional Comments: 3D was performed not requiring image post processing on an  independent workstation and was abnormal. Spectral Doppler performed. Janelle Mediate MD Electronically signed by Janelle Mediate MD Signature Date/Time: 07/09/2023/2:43:12 PM    Final    EP STUDY Result Date: 07/09/2023 See surgical note for result.  Structural Heart Procedure Result Date: 07/09/2023 See surgical note for  result.  CARDIAC CATHETERIZATION Result Date: 07/09/2023 See surgical note for result.  DG Chest 2 View Result Date: 07/09/2023 CLINICAL DATA:  Preop chest exam prior to mitral valve repair. EXAM: CHEST - 2 VIEW COMPARISON:  05/13/2023 FINDINGS: Unchanged mild cardiomegaly and mild pulmonary vascular congestion. Lungs are clear. Interval retraction of RIGHT IJ tunneled catheter with tip now terminating in the region of the RIGHT brachiocephalic vein. IMPRESSION: 1. Unchanged cardiomegaly and mild pulmonary vascular congestion. 2. Retracted tunneled RIGHT IJ catheter. Electronically Signed   By: Elester Grim M.D.   On: 07/09/2023 09:07    Review of Systems  Unable to perform ROS: Intubated   Blood pressure (!) 134/97, pulse (!) 216, temperature (!) 97.2 F (36.2 C), resp. rate (!) 22, height 5' 10 (1.778 m), weight 110.2 kg, SpO2 98%. Physical Exam Vitals reviewed.  Constitutional:      Appearance: She is ill-appearing.     Comments: intubated   Cardiovascular:     Rate and Rhythm: Tachycardia present.     Heart sounds: Murmur heard.  Pulmonary:     Comments: Clear anteriorly on ventilator  Skin:    General: Skin is warm and dry.   Neurological:     Comments: sedated    Assessment/Plan: Critically ill 48 year old woman s/p mTEER with severe residual MR and cardiogenic shock.  Currently has stabilized with IABP, milrinone , SNP, and Lasix  drips.  CI currently 4.2  Likely will need Impella 5.5 for temporary LV support.  Will review with AHF team in AM  Zelphia Higashi 07/09/2023, 5:38 PM

## 2023-07-09 NOTE — Op Note (Signed)
 [facility]   MULTIDISCIPLINARY HEART TEAM  Date of Procedure:  07/09/2023  Preoperative Diagnosis: Severe Symptomatic Mitral Regurgitation (Stage D)  Postoperative Diagnosis: Same   Procedure Performed: Ultrasound-guided right transfemoral venous access Double PreClose right femoral vein Transseptal puncture using Bailess RF needle Mitral valve repair with MitraClip XTW  Surgeon: Arlander Bellman, MD   Echocardiographer: Janelle Mediate, MD  Anesthesiologist: Denny Flack, DO  Device Implant: Mitraclip XTW  Procedural Indication: Severe Non-rheumatic Mitral Regurgitation (Stage D)   Brief History: 48 yo woman with advanced heart failure and severe MR presents for mitral TEER after undergoing multidisciplinary team review with Advanced heart failure and structural cardiology  Echo Findings: Preop:  Severe global LV systolic dysfunction Severe MR secondary to posterior leaflet restriction and annular.dilatation, Grade 4+ Post-op:  Unchanged LV systolic function Severe residual MR  Procedural Details: Prep The patient is brought to the cardiac catheterization lab in the fasting state. General anesthesia is induced. The patient is prepped from the groin to chin. Hemodynamics are monitored via a radial artery line.   Venous Access Using ultrasound guidance, the right femoral vein is punctured. Ultrasound images are captured and stored in the patient's chart. The vein is dilated and 2 Perclose devices are deplyed at 10' and 2' positions to 'Preclose' the femoral vein. An 8 Fr sheath is inserted.  Transseptal Puncture A Baylis Versacross wire is advanced into the SVC A Baylis transseptal dilator is advanced into the SVC, and the VersaCross RF wire is retracted into the dilator.  Caution is used to traverse the IVC filter. The transseptal sheath is retracted into the RA under fluoroscopic and echo guidance to obtain position on the posterior fossa where echo  measurements are made to assure appropriate access to the mitral valve. Once proper position is confirmed by echo, RF energy is delivered and the VersaCross wire is advanced into the LA without resistance. The dilator and sheath are advanced over the wire where proper position is confirmed by echo and pressure measurement Weight based IV heparin  is administered and a therapeutic ACT > 250 is confirmed  Steerable Guide Catheter Insertion The VersaCross wire is positioned at the left upper pulmonary vein The femoral vein is progressively dilated and the 24 Fr Steerable guide catheter is inserted and then directed across the interatrial septum over the wire. Position is confirmed approximately 3 cm into the left atrium The guide is de-aired   MitraClip Insertion The MitraClip XTW is prepped per protocol and inserted via the introducer into the steerable guide catheter The Clip Delivery System (CDS) is advanced under fluoro and echo guidance so that the sleeve markers are evenly spaced on each side of the guide marker  MitraClip Positioning in the Left Atrium (Supravalvular Alignment) M-knob is applied to bring the Clip towards the mitral valve. Echo guidance is used to avoid contact with LA structures. The Clip arms are opened to 180 degrees 2D and 3D TEE imaging is performed in multiple planes and the Clip is positioned and aligned above the valve using standard steering techniques   Entry into the Left Ventricle and Mitral Valve Leaflet Grasp The Clip is advanced across the mitral valve into the LV, maintaining proper orientation The Clip arms are opened to 120 degrees and the Clip is slowly retracted  Capture of both the anterior and posterior leaflets are visualized by echo and the grippers are dropped.  Leaflet capture was technically challenging.  Multiple grasps are made.  Initially, attempts were made  to start along the medial side of the valve, but these were unsuccessful.  Eventually, we  were able to successfully capture both the anterior and posterior leaflets along the medial side of A2/P2.  Leaflet capture was confirmed then the clip is closed.  MitraClip Deployment After extensive echo evaluation, the clip appears stable with good leaflet insertion and modest improvement in mitral regurgitation.  We felt like this was an acceptable result for the first clip with plans to use multiple clips due to the severity of mitral regurgitation with very wide MR jet. Following standard protocol, the lock line is removed after testing the lock mechanism. The lock is rechecked and is shown to be intact. The MitraClip device is deployed and the clip delivery system is removed under echo guidance with caution taken to avoid contact with LA structures  Placement of Clip #2 MitraClip Insertion The MitraClip XTW is prepped per protocol and inserted via the introducer into the steerable guide catheter The Clip Delivery System (CDS) is advanced under fluoro and echo guidance so that the sleeve markers are evenly spaced on each side of the guide marker  MitraClip Positioning in the Left Atrium (Supravalvular Alignment) M-knob is applied to bring the Clip towards the mitral valve. Echo guidance is used to avoid contact with LA structures. Low tidal volume respiration is initiated The Clip arms are opened to 180 degrees 2D and 3D TEE imaging is performed in multiple planes and the Clip is positioned and aligned above the valve using standard steering techniques   Entry into the Left Ventricle and Mitral Valve Leaflet Grasp The Clip is advanced across the mitral valve into the LV, lateral to the first clip, maintaining proper orientation and with caution taken to avoid contact with the first Clip The Clip arms are opened further and the Clip is slowly retracted  We were unable to capture the leaflets with the second clip.  Multiple attempts were made.  The devices everted and pulled back up into the  left atrium and different trajectories are attempted.  We also tried to isolate the posterior leaflet and anterior leaflet for capture of 1 leaflet at a time.  All of these techniques were unsuccessful.  After multiple attempts, the device was pulled back into the left atrium and we aborted further attempts.  There was evidence of torn cords near the posterior leaflet and the mitral regurgitation at the end of the case was severe.  I am certain this impacted our ability to obtain a successful procedural result.  The second MitraClip device is removed from the body and was not deployed.  The steerable guide catheter is retracted into the right atrium and the interatrial septum is assessed by echo without evidence of right-to-left shunting or large ASD  Hemostasis The guide catheter is removed over a 0.035 wire and the Perclose sutures are tightened with complete hemostasis and no evidence of hematoma  Swan-Ganz insertion: Due to the patient's tenuous clinical status and severe residual mitral regurgitation, I consulted with the advanced heart failure team and decided to place a Swan-Ganz catheter and intra-aortic balloon pump to help unload the patient's left ventricle and provide invasive hemodynamic monitoring.  Using vascular ultrasound guidance, an 8 French sheath is inserted into the right internal jugular vein.  A Swan-Ganz catheter is advanced through the right heart chambers into the pulmonary capillary wedge position and pressures are recorded throughout.  The catheter is secured in place.  Intra-aortic balloon pump insertion: Vascular ultrasound guidance and  a micropuncture technique is used to access the right femoral artery.  An 8 French sheath is inserted.  An intra-aortic balloon pump is inserted to appropriate position in the descending thoracic aorta using fluoroscopic landmarks.  Intra-aortic balloon pump is set to augment at 1: 1.  Estimated blood loss: minimal  Final conclusion:  Placement of 1 MitraClip XTW device A2/P2 with severe residual mitral regurgitation.  Invasive PA catheter and intra-aortic balloon pump placed at the completion of the procedure for hemodynamic monitoring and LV unloading.  Rhoderick Farrel 07/09/2023 3:11 PM

## 2023-07-09 NOTE — Discharge Instructions (Signed)
 Home Care Following Your Mitral Valve Clip Procedure      If you have any questions or concerns you can call the structural heart office at [phone] during normal business hours 8am-4pm. If you have an urgent need after hours or on the weekend, please call [phone] to talk to the on call provider for general cardiology. If you have an emergency that requires immediate attention, please call 911.   Groin Site Care Refer to this sheet in the next few weeks. These instructions provide you with information on caring for yourself after your procedure. Your caregiver may also give you more specific instructions. Your treatment has been planned according to current medical practices, but problems sometimes occur. Call your caregiver if you have any problems or questions after your procedure. HOME CARE INSTRUCTIONS You may shower 24 hours after the procedure. Remove the bandage (dressing) and gently wash the site with plain soap and water. Gently pat the site dry.  Do not apply powder or lotion to the site.  Do not sit in a bathtub, swimming pool, or whirlpool for 5 to 7 days.  No bending, squatting, or lifting anything over 10 pounds (4.5 kg) as directed by your caregiver.  Inspect the site at least twice daily.  Do not drive home if you are discharged the same day of the procedure. Have someone else drive you.  You may drive 72 hours after the procedure unless otherwise instructed by your caregiver.  What to expect: Any bruising will usually fade within 1 to 2 weeks.  Blood that collects in the tissue (hematoma) may be painful to the touch. It should usually decrease in size and tenderness within 1 to 2 weeks.  SEEK IMMEDIATE MEDICAL CARE IF: You have unusual pain at the groin site or down the affected leg.  You have redness, warmth, swelling, or pain at the groin site.  You have drainage (other than a small amount of blood on the dressing).  You have chills.  You have a fever or persistent  symptoms for more than 72 hours.  You have a fever and your symptoms suddenly get worse.  Your leg becomes pale, cool, tingly, or numb.  You have bleeding from the site. Hold pressure on the site until it subsides.    After MitraClip Checklist  Check  Test Description   Follow up appointment in 1-2 weeks  Most of our patients will see our structural heart APP or your primary cardiologist within 1-2 weeks. Your incision site will be checked and you will be cleared to resume all normal activities if you are doing well.     1 month echo and follow up  You will have an echo to check on your heart valve clip and be seen back in the office by our structural heart APP   Follow up with your primary cardiologist You will need to be seen by your primary cardiologist in the following 3-6 months after your 1 month appointment in the valve clinic. Often times your blood thinners will be changed. This is decided on a case by case basis.    1 year echo and follow up You will have another echo to check on your heart valve after one year and be seen back in the office by our structural heart APP. This your last structural heart visit.   Bacterial endocarditis prophylaxis  You will have to take antibiotics for the rest of your life before all dental procedures (even dental cleanings) to protect your  heart valve from potential infection. Antibiotics are also required before some surgeries. Please check with your cardiologist before scheduling any surgeries. Also, please make sure to tell us if you have a penicillin allergy as you will require an alternative antibiotic.    ______________  Your Implant Identification Card Following your procedure, you will receive an Implant Identification Card, which your doctor will fill out and which you must carry with you at all times. Show your Implant Identification Card if you report to an emergency room. This card identifies you as a patient who has had a device implanted. If  you require a magnetic resonance imaging (MRI) scan, tell your doctor or MRI technician that you have a clip device implanted. Test results indicate that patients with the mitral valve clip can safely undergo MRI scans under certain conditions described on the card.

## 2023-07-09 NOTE — Consult Note (Addendum)
 Advanced Heart Failure Team Consult Note   Primary Physician: Grooms, [place], [place] Cardiologist:  Alwin Baars, DO Reason for Consultation: Cardiogenic shock HPI:    [name] [name] is seen today for evaluation of cardiogenic shock at the request of Dr. Arlester Ladd with structural heart team.  48 y.o. female with hx of saddle PE/DVT s/p IVC filter (2015), HFrEF/NICM, pancytopenia, nonobstructive CAD, morbid obesity, noncompliance, pancytopenia (followed by hematology), tobacco use.   In 5/23 she was admitted with acute hypoxic respiratory failure requiring intubation. LHC during that admit w/ nonobstructive CAD. She was admitted again in 9/23 with syncope, nausea and C2 fracture managed conservatively. LVEF during that admit noted to be 35% w/ severe posterior MR.    Admitted with acute on chronic CHF in 06/24. EF was down to 25-30%.    Had been considered for mTEER and possible LVAD but was lost to follow-up until recent readmission.   Admitted to [facility] ICU in 04/25 with cardiogenic shock. Transferred to [facility] for management.  Echo 4/25 EF 20-25% severe RV dysfunction with septal flattening mod-severe MR. Required inotrope/pressor support to diurese. Initially on DBA but later transitioned to milrinone  given severely elevated PA pressures on RHC. She was discharged with home inotrope with plan to workup for advanced therapies as an ouptatient if compliance improved. Discharge weight 244 lbs.  Seen for follow-up post-discharge and continued to struggle with NYHA IV symptoms. There was concern that severe MR was contributing to symptoms. TEE 06/02/23: EF 40-45%, severe LAE, moderately reduced RV, severe posterior MR. RHC 06/02/23 on 0.25 milrinone : severe pulmonary venous hypertension, severely elevated PCWP with prominent v waves, preserved CO. She was seen by Structural Heart Team and scheduled for mTEER.  Patient presented for mTEER today. Procedure very difficult c/b torn leaflet resulting  in torrential MR and cardiogenic shock. IABP placed and she was transferred to the ICU for further management. Given Lasix  80 mg IV with significant UOP.   Home Medications Prior to Admission medications   Medication Sig Start Date End Date Taking? Authorizing Provider  albuterol  (VENTOLIN  HFA) 108 (90 Base) MCG/ACT inhaler Inhale 2 puffs into the lungs every 2 (two) hours as needed for wheezing or shortness of breath. 07/09/22  Yes Emokpae, Courage, MD  atorvastatin  (LIPITOR) 20 MG tablet TAKE ONE TABLET BY MOUTH ONCE DAILY. 04/30/23  Yes Branch, Joyceann No, MD  benzonatate  (TESSALON ) 100 MG capsule Take 1 capsule (100 mg total) by mouth 3 (three) times daily as needed for cough. Do not take with alcohol  or while operating or driving heavy machinery 08/27/55  Yes Grooms, [place], PA-C  bumetanide  (BUMEX ) 2 MG tablet Take 1 tablet (2 mg total) by mouth daily. 05/16/23  Yes Anvita Hirata, [name], NP  digoxin  (LANOXIN ) 0.125 MG tablet Take 1 tablet (0.125 mg total) by mouth daily. 05/17/23  Yes Tessi Eustache, [name], NP  enoxaparin  (LOVENOX ) 80 MG/0.8ML injection Inject two 80mg  syringes under the skin once a day 07/04/23  Yes Arnoldo Lapping, MD  milrinone  (PRIMACOR ) 20 MG/100 ML SOLN infusion Inject 0.0318 mg/min into the vein continuous. 05/16/23  Yes Nechemia Chiappetta, [name], NP  NON FORMULARY Pt uses a cpap nightly   Yes [provider]  potassium chloride  SA (KLOR-CON  M) 20 MEQ tablet Take 2 tablets (40 mEq total) by mouth 2 (two) times daily. 05/16/23  Yes Jakari Sada, [name], NP  spironolactone  (ALDACTONE ) 25 MG tablet Take 2 tablets (50 mg total) by mouth daily. 05/16/23  Yes Catalyna Reilly, [name], NP  warfarin (COUMADIN ) 10 MG tablet Take  10 mg (1 tablet) by mouth daily Monday through Friday, then take 5 mg (1/2 tablet) by mouth on Saturday and on Sunday. 05/16/23   Derion Kreiter, [name], NP   Past Medical History: Past Medical History:  Diagnosis Date   Anemia    Blood transfusion without reported diagnosis    CAD (coronary artery disease)     Nonobstructive   Closed left ankle fracture    August 30 2012   Clotting disorder [facility])    DVT (deep venous thrombosis) (HCC)    L leg   Fibroid tumor    HFrEF (heart failure with reduced ejection fraction) (HCC)    Hidradenitis    Hypertension    Mitral regurgitation    Myocardial infarction (HCC)    NICM (nonischemic cardiomyopathy) (HCC)    NSVT (nonsustained ventricular tachycardia) (HCC)    Obesity    OSA on CPAP    Pulmonary embolism (HCC) 04/2013   Sleep apnea     Past Surgical History: Past Surgical History:  Procedure Laterality Date   ABSCESS DRAINAGE Bilateral 01/10/2015   BREAST BIOPSY Right 02/01/2020   benign   COLONOSCOPY WITH PROPOFOL  N/A 06/08/2018   Procedure: COLONOSCOPY WITH PROPOFOL ;  Surgeon: Suzette Espy, MD;  Location[facility];  Service: Endoscopy;  Laterality: N/A;  2:30pm   CYST REMOVAL TRUNK     IR FLUORO GUIDE CV LINE RIGHT  05/16/2023   IR US  GUIDE VASC ACCESS RIGHT  05/16/2023   IVC FILTER PLACEMENT ([facility] HX)     LOWER EXTREMITY VENOGRAPHY N/A 02/07/2021   Procedure: LOWER EXTREMITY VENOGRAPHY;  Surgeon: Kayla Part, MD;  Location: [facility] INVASIVE CV LAB;  Service: Cardiovascular;  Laterality: N/A;   PERIPHERAL VASCULAR THROMBECTOMY N/A 02/07/2021   Procedure: PERIPHERAL VASCULAR THROMBECTOMY;  Surgeon: Kayla Part, MD;  Location: [facility] INVASIVE CV LAB;  Service: Cardiovascular;  Laterality: N/A;   RIGHT HEART CATH N/A 03/08/2022   Procedure: RIGHT HEART CATH;  Surgeon: Alwin Baars, DO;  Location: [facility] INVASIVE CV LAB;  Service: Cardiovascular;  Laterality: N/A;   RIGHT HEART CATH N/A 10/04/2022   Procedure: RIGHT HEART CATH;  Surgeon: Alwin Baars, DO;  Location: [facility] INVASIVE CV LAB;  Service: Cardiovascular;  Laterality: N/A;   RIGHT HEART CATH N/A 05/13/2023   Procedure: RIGHT HEART CATH;  Surgeon: [name], MD;  Location: [facility] INVASIVE CV LAB;  Service: Cardiovascular;  Laterality: N/A;   RIGHT HEART CATH N/A 06/02/2023    Procedure: RIGHT HEART CATH;  Surgeon: Mardell Shade, MD;  Location: [facility] INVASIVE CV LAB;  Service: Cardiovascular;  Laterality: N/A;   RIGHT/LEFT HEART CATH AND CORONARY ANGIOGRAPHY N/A 06/19/2021   Procedure: RIGHT/LEFT HEART CATH AND CORONARY ANGIOGRAPHY;  Surgeon: Arty Binning, MD;  Location: [facility] INVASIVE CV LAB;  Service: Cardiovascular;  Laterality: N/A;   TEE WITHOUT CARDIOVERSION N/A 03/08/2022   Procedure: TRANSESOPHAGEAL ECHOCARDIOGRAM (TEE);  Surgeon: Alwin Baars, DO;  Location: [facility] ENDOSCOPY;  Service: Cardiovascular;  Laterality: N/A;   TRANSESOPHAGEAL ECHOCARDIOGRAM (CATH LAB) N/A 06/02/2023   Procedure: TRANSESOPHAGEAL ECHOCARDIOGRAM;  Surgeon: Mardell Shade, MD;  Location: [facility] INVASIVE CV LAB;  Service: Cardiovascular;  Laterality: N/A;   Family History: Family History  Problem Relation Age of Onset   Hypertension Mother    Diabetes Paternal Aunt    Diabetes Paternal Uncle    Other Father        blood clots   Cancer Maternal Grandmother    Heart attack Paternal Grandmother    Colon cancer  Neg Hx     Social History: Social History   Socioeconomic History   Marital status: Widowed    Spouse name: Not on file   Number of children: Not on file   Years of education: Not on file   Highest education level: Some college, no degree  Occupational History   Not on file  Tobacco Use   Smoking status: Every Day    Types: Cigarettes    Passive exposure: Never   Smokeless tobacco: Never   Tobacco comments:    Smokes 2-3 cigarettes daily as of 02/13/22  Vaping Use   Vaping status: Never Used  Substance and Sexual Activity   Alcohol  use: Yes    Alcohol /week: 2.0 standard drinks of alcohol     Types: 2 Cans of beer per week    Comment: twice a week   Drug use: No   Sexual activity: Not Currently    Birth control/protection: Pill, None  Other Topics Concern   Not on file  Social History Narrative   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    Has a 47 year old Aviauna.   Live with parents.   Was working 5 days a weeks.   Not working right now.    Attends church.    Social Drivers of Corporate investment banker Strain: Low Risk  (04/03/2023)   Overall Financial Resource Strain (CARDIA)    Difficulty of Paying Living Expenses: Not very hard  Food Insecurity: No Food Insecurity (05/20/2023)   Hunger Vital Sign    Worried About Running Out of Food in the Last Year: Never true    Ran Out of Food in the Last Year: Never true  Transportation Needs: No Transportation Needs (05/20/2023)   PRAPARE - Administrator, Civil Service (Medical): No    Lack of Transportation (Non-Medical): No  Physical Activity: Insufficiently Active (04/03/2023)   Exercise Vital Sign    Days of Exercise per Week: 2 days    Minutes of Exercise per Session: 10 min  Stress: No Stress Concern Present (04/03/2023)   [employer] of Occupational Health - Occupational Stress Questionnaire    Feeling of Stress : Only a little  Social Connections: Socially Isolated (04/03/2023)   Social Connection and Isolation Panel    Frequency of Communication with Friends and Family: Twice a week    Frequency of Social Gatherings with Friends and Family: Twice a week    Attends Religious Services: Never    Database administrator or Organizations: No    Attends Banker Meetings: Never    Marital Status: Widowed   Allergies:  Allergies  Allergen Reactions   Sulfa  Antibiotics Itching, Swelling and Rash    Lip swelling   Objective:    Vital Signs:   Temp:  [98.3 F (36.8 C)] 98.3 F (36.8 C) (06/18 0636) Pulse Rate:  [0-110] 0 (06/18 1430) Resp:  [18] 18 (06/18 0636) BP: (134)/(97) 134/97 (06/18 0636) SpO2:  [97 %] 97 % (06/18 0636) FiO2 (%):  [100 %] 100 % (06/18 1438) Weight:  [110.2 kg] 110.2 kg (06/18 0636)   Weight change: Filed Weights   07/09/23 0636  Weight: 110.2 kg   Intake/Output:  Intake/Output Summary (Last  24 hours) at 07/09/2023 1501 Last data filed at 07/09/2023 1420 Gross per 24 hour  Intake 950 ml  Output 25 ml  Net 925 ml    Physical Exam  General: Intubated  Cardiac: balloon heard on auscultation. Severe systolic murmur at apex Resp: Coarse crackles Abdomen: Soft, non-tender, non-distended.  Extremities: Warm and dry.  1+ BLE edema.  Neuro: Sedated  Labs   Basic Metabolic Panel: Recent Labs  Lab 07/04/23 1057 07/09/23 1114 07/09/23 1251 07/09/23 1340 07/09/23 1352  NA 132* 136 136 136 136  136  K 3.5 3.6 4.2 4.3 4.2  4.2  CL 95*  --   --   --   --   CO2 28  --   --   --   --   GLUCOSE 138*  --   --   --   --   BUN 15  --   --   --   --   CREATININE 0.80  --   --   --   --   CALCIUM  9.1  --   --   --   --    Liver Function Tests: Recent Labs  Lab 07/04/23 1057  AST 25  ALT 13  ALKPHOS 86  BILITOT 1.6*  PROT 7.5  ALBUMIN 3.2*   No results for input(s): LIPASE, AMYLASE in the last 168 hours. No results for input(s): AMMONIA in the last 168 hours.  CBC: Recent Labs  Lab 07/04/23 1057 07/09/23 1114 07/09/23 1251 07/09/23 1340 07/09/23 1352  WBC 3.5*  --   --   --   --   NEUTROABS 2.3  --   --   --   --   HGB 12.9 14.3 15.3* 15.3* 15.3*  15.0  HCT 41.6 42.0 45.0 45.0 45.0  44.0  MCV 85.6  --   --   --   --   PLT 188  --   --   --   --    Cardiac Enzymes: No results for input(s): CKTOTAL, CKMB, CKMBINDEX, TROPONINI in the last 168 hours.  BNP (last 3 results) Recent Labs    05/28/23 1224 06/30/23 1218 07/04/23 1057  BNP 1,155.3* 600.6* 612.0*   Coagulation Studies: Recent Labs    07/09/23 0648  LABPROT 16.1*  INR 1.3*   Medications:    Current Medications:  Chlorhexidine  Gluconate Cloth  6 each Topical Daily   docusate  100 mg Per Tube BID   insulin  aspart  0-9 Units Subcutaneous Q4H   mouth rinse  15 mL Mouth Rinse Q2H   pantoprazole  (PROTONIX ) IV  40 mg Intravenous Daily   polyethylene glycol  17 g Per Tube  Daily   Infusions:  ceFAZolin     furosemide  (LASIX ) 200 mg in dextrose  5 % 100 mL (2 mg/mL) infusion     milrinone      nitroPRUSSide     propofol  (DIPRIVAN ) infusion     Patient Profile   47 y.o. female with history of chronic systolic HF with biventricular dysfunction on home inotrope, severe MR, hx PE/DVT, obesity, CAD, anemia, tobacco use. Presented for mTEER on 07/09/23. Procedure c/b torn mitral valve leaflet resulting in torrential MR and development of cardiogenic shock.  Assessment/Plan   Acute on chronic systolic HF with biventricular dysfunction>Cardiogenic shock - stage D cardiomyopathy - LHC in 2023 with nonobstructive CAD.  - RHC 4/25 (on 7.5 DBA): Severely elevated biventricular filling pressures, severe post capillary PH, normal CO by TD. - Echo 4/25 EF 25-30% moderately reduced RV with mod/sev MR  - TEE 05/25: EF 40-45%, RV moderately reduced, severe BAE, severe posterior MR - RHC 05/25 on milrinone  0.25: severe pulmonary venous HTN, severely elevated  PCWP with prominent v waves, preserved CO.  - Presented for mTEER 07/09/23. Procedure complicated by torn mitral valve leaflet resulting in torrential MR and cardiogenic shock. IABP placed in cath lab. Will need to stabilize and eventually decide if will be candidate for LVAD (RV may be prohibitive). Not a candidate for transplant with tobacco use. - In cath lab PCWP in 40s with V waves to 60s. PAC placed. Hemodynamics pending.  - Continue milrinone  0.375 mcg/kg/min - Start Nipride for afterload reduction. Aim for MAP 65. - Given Lasix  80 mg IV. Start Lasix  20 mg/hr - Start heparin  gtt  2. Severe MR - See above  3. Acute hypoxic respiratory failure - In setting of acute on chronic CHF and severe/torrential MR - Vent management per CCM; keep intubated.    4. Hx of PE/DVT w/ IVC filter - Submassive saddle PE extending from the distal main pulmonary artery and to the left and right pulmonary arteries with extension  into the left upper lobe, left lower lobe and lingula branches and right upper lobe, right middle lobe and right lower lobes on 04/27/2013  - Evaluated at [facility] where thrombophilia work-up was negative (Lupus inhibitor, anticardiolipin antibodies, and anti beta-2  glycoprotein antibodies were negative; protein C activity 85%, protein S activity 76%, factor V Leiden, and prothrombin gene mutation ) - IVC filter in place; could not be removed due to significant thrombus burden on filter at [facility]  - Previously advised against DOACs due to severity of thrombus burden and weight >250lbs.  - Heparin  gtt for now with IABP>>eventually back to Warfarin   5. CAD - LHC in 5/23 w/ nonobstructive CAD involving RCA - No aspirin  with need for anticoagulation. Continue statin.   6. Tobacco use - Smoking 3 cigarettes/day at time of last follow-up. Unable to assess currently.  Length of Stay: 0  CRITICAL CARE Performed by: [name] [name]  Total critical care time: 15 minutes  -Critical care time was exclusive of separately billable procedures and treating other patients. -Critical care was necessary to treat or prevent imminent or life-threatening deterioration. -Critical care was time spent personally by me on the following activities: development of treatment plan with patient and/or surrogate as well as nursing, discussions with consultants, evaluation of patient's response to treatment, examination of patient, obtaining history from patient or surrogate, ordering and performing treatments and interventions, ordering and review of laboratory studies, ordering and review of radiographic studies, pulse oximetry and re-evaluation of patient's condition.  [name] Maxcine Strong, NP  07/09/2023, 3:01 PM  Advanced Heart Failure Team Pager [phone] (M-F; 7a - 5p)

## 2023-07-09 NOTE — Anesthesia Procedure Notes (Addendum)
 Arterial Line Insertion Start/End6/18/2025 7:50 AM, 07/09/2023 7:55 AM Performed by: Lethaniel Rave, MD, Bennett Brass, CRNA, CRNA  Patient location: Pre-op. Preanesthetic checklist: patient identified, IV checked, site marked, risks and benefits discussed, surgical consent, monitors and equipment checked, pre-op evaluation, timeout performed and anesthesia consent Lidocaine  1% used for infiltration Right, radial was placed Catheter size: 20 G Hand hygiene performed  and maximum sterile barriers used   Attempts: 1 Procedure performed using ultrasound guided technique. Ultrasound Notes:anatomy identified, needle tip was noted to be adjacent to the nerve/plexus identified and no ultrasound evidence of intravascular and/or intraneural injection Following insertion, dressing applied. Post procedure assessment: normal and unchanged  Additional procedure comments: Unable to transduce or draw back from A line. Dr. Lavada Porteous replaced in left radial artery with US  guidance.Aaron Aas

## 2023-07-09 NOTE — Progress Notes (Signed)
 PHARMACY - ANTICOAGULATION CONSULT NOTE  Pharmacy Consult for heparin  Indication: IABP  Allergies  Allergen Reactions   Sulfa  Antibiotics Itching, Swelling and Rash    Lip swelling    Patient Measurements: Height: 5' 10 (177.8 cm) Weight: 110.2 kg (243 lb) IBW/kg (Calculated) : 68.5 HEPARIN  DW (KG): 93  Vital Signs: Temp: 98.3 F (36.8 C) (06/18 0636) Temp Source: Oral (06/18 0636) BP: 134/97 (06/18 0636) Pulse Rate: 0 (06/18 1430)  Labs: Recent Labs    07/09/23 0648 07/09/23 1114 07/09/23 1340 07/09/23 1352 07/09/23 1507  HGB  --    < > 15.3* 15.3*  15.0 15.3*  HCT  --    < > 45.0 45.0  44.0 45.0  LABPROT 16.1*  --   --   --   --   INR 1.3*  --   --   --   --    < > = values in this interval not displayed.    Estimated Creatinine Clearance: 118.2 mL/min (by C-G formula based on SCr of 0.8 mg/dL).   Medical History: Past Medical History:  Diagnosis Date   Anemia    Blood transfusion without reported diagnosis    CAD (coronary artery disease)    Nonobstructive   Closed left ankle fracture    August 30 2012   Clotting disorder (HCC)    DVT (deep venous thrombosis) (HCC)    L leg   Fibroid tumor    HFrEF (heart failure with reduced ejection fraction) (HCC)    Hidradenitis    Hypertension    Mitral regurgitation    Myocardial infarction (HCC)    NICM (nonischemic cardiomyopathy) (HCC)    NSVT (nonsustained ventricular tachycardia) (HCC)    Obesity    OSA on CPAP    Pulmonary embolism (HCC) 04/2013   Sleep apnea     Medications:  Scheduled:   Chlorhexidine  Gluconate Cloth  6 each Topical Daily   docusate  100 mg Per Tube BID   insulin  aspart  0-9 Units Subcutaneous Q4H   mouth rinse  15 mL Mouth Rinse Q2H   pantoprazole  (PROTONIX ) IV  40 mg Intravenous Daily   polyethylene glycol  17 g Per Tube Daily    Assessment: 37 yof who presented for mTEER complicated with torn mitral valve leaflet resulting in torrential MR and cardiogenic shock  requiring IABP. On warfarin PTA for hx PE (LD 6/12) - was on enoxaparin  while warfarin on hold for procedure (LD 6/17).  Received 25,000 unit of heparin  during procedure - last ACT in lab was 233 ~1346. Hgb last 15 on I-STAT. No s/sx of bleeding.   Will start heparin  infusion for IABP 2 hours after procedure.   Goal of Therapy:  Heparin  level 0.3-0.5 units/ml Monitor platelets by anticoagulation protocol: Yes   Plan:  Start heparin  infusion at 900 units/hr on 6/18 at 1630 Order heparin  level 6 hours after start Monitor daily HL, CBC, and for s/sx of bleeding   Thank you for allowing pharmacy to participate in this patient's care,  Nieves Bars, PharmD, BCCCP Clinical Pharmacist  Phone: [phone] 07/09/2023 3:21 PM  Please check AMION for all [facility] phone numbers After 10:00 PM, call Main Pharmacy [phone]

## 2023-07-09 NOTE — H&P (Deleted)
 Advanced Heart Failure Team Consult Note   Primary Physician: Grooms, [place], [place] Cardiologist:  Alwin Baars, DO  Reason for Consultation: Cardiogenic shock  HPI:    [name] [name] is seen today for evaluation of cardiogenic shock at the request of Dr. Arlester Ladd with structural heart team.  48 y.o. female with hx of saddle PE/DVT s/p IVC filter (2015), HFrEF/NICM, pancytopenia, nonobstructive CAD, morbid obesity, noncompliance, pancytopenia (followed by hematology), tobacco use.   In 5/23 she was admitted with acute hypoxic respiratory failure requiring intubation. LHC during that admit w/ nonobstructive CAD. She was admitted again in 9/23 with syncope, nausea and C2 fracture managed conservatively. LVEF during that admit noted to be 35% w/ severe posterior MR.    Admitted with acute on chronic CHF in 06/24. EF was down to 25-30%.    Had been considered for mTEER and possible LVAD but was lost to follow-up until recent readmission.   Admitted to [facility] ICU in 04/25 with cardiogenic shock. Transferred to [facility] for management.  Echo 4/25 EF 20-25% severe RV dysfunction with septal flattening mod-severe MR. Required inotrope/pressor support to diurese. Initially on DBA but later transitioned to milrinone  given severely elevated PA pressures on RHC. She was discharged with home inotrope with plan to workup for advanced therapies as an ouptatient if compliance improved. Discharge weight 244 lbs.  Seen for follow-up post-discharge and continued to struggle with NYHA IV symptoms. There was concern that severe MR was contributing to symptoms. TEE 06/02/23: EF 40-45%, severe LAE, moderately reduced RV, severe posterior MR. RHC 06/02/23 on 0.25 milrinone : severe pulmonary venous hypertension, severely elevated PCWP with prominent v waves, preserved CO. She was seen by Structural Heart Team and scheduled for mTEER.  Patient presented for mTEER today. Procedure very difficult c/b torn leaflet  resulting in torrential MR and cardiogenic shock. IABP placed and she was transferred to the ICU for further management.   Home Medications Prior to Admission medications   Medication Sig Start Date End Date Taking? Authorizing Provider  albuterol  (VENTOLIN  HFA) 108 (90 Base) MCG/ACT inhaler Inhale 2 puffs into the lungs every 2 (two) hours as needed for wheezing or shortness of breath. 07/09/22  Yes Emokpae, Courage, MD  atorvastatin  (LIPITOR) 20 MG tablet TAKE ONE TABLET BY MOUTH ONCE DAILY. 04/30/23  Yes Branch, Joyceann No, MD  benzonatate  (TESSALON ) 100 MG capsule Take 1 capsule (100 mg total) by mouth 3 (three) times daily as needed for cough. Do not take with alcohol  or while operating or driving heavy machinery 05/23/82  Yes Grooms, Jearlean Mince, PA-C  bumetanide  (BUMEX ) 2 MG tablet Take 1 tablet (2 mg total) by mouth daily. 05/16/23  Yes [name], [name], NP  digoxin  (LANOXIN ) 0.125 MG tablet Take 1 tablet (0.125 mg total) by mouth daily. 05/17/23  Yes [name], [name], NP  enoxaparin  (LOVENOX ) 80 MG/0.8ML injection Inject two 80mg  syringes under the skin once a day 07/04/23  Yes Arnoldo Lapping, MD  milrinone  (PRIMACOR ) 20 MG/100 ML SOLN infusion Inject 0.0318 mg/min into the vein continuous. 05/16/23  Yes [name], [name], NP  NON FORMULARY Pt uses a cpap nightly   Yes [provider]  potassium chloride  SA (KLOR-CON  M) 20 MEQ tablet Take 2 tablets (40 mEq total) by mouth 2 (two) times daily. 05/16/23  Yes [name], [name], NP  spironolactone  (ALDACTONE ) 25 MG tablet Take 2 tablets (50 mg total) by mouth daily. 05/16/23  Yes [name], [name], NP  warfarin (COUMADIN ) 10 MG tablet Take 10 mg (1 tablet) by mouth  daily Monday through Friday, then take 5 mg (1/2 tablet) by mouth on Saturday and on Sunday. 05/16/23   [name], [name], NP    Past Medical History: Past Medical History:  Diagnosis Date   Anemia    Blood transfusion without reported diagnosis    CAD (coronary artery disease)    Nonobstructive   Closed left  ankle fracture    August 30 2012   Clotting disorder [facility])    DVT (deep venous thrombosis) (HCC)    L leg   Fibroid tumor    HFrEF (heart failure with reduced ejection fraction) (HCC)    Hidradenitis    Hypertension    Mitral regurgitation    Myocardial infarction (HCC)    NICM (nonischemic cardiomyopathy) (HCC)    NSVT (nonsustained ventricular tachycardia) (HCC)    Obesity    OSA on CPAP    Pulmonary embolism (HCC) 04/2013   Sleep apnea     Past Surgical History: Past Surgical History:  Procedure Laterality Date   ABSCESS DRAINAGE Bilateral 01/10/2015   BREAST BIOPSY Right 02/01/2020   benign   COLONOSCOPY WITH PROPOFOL  N/A 06/08/2018   Procedure: COLONOSCOPY WITH PROPOFOL ;  Surgeon: Suzette Espy, MD;  Location: [facility];  Service: Endoscopy;  Laterality: N/A;  2:30pm   CYST REMOVAL TRUNK     IR FLUORO GUIDE CV LINE RIGHT  05/16/2023   IR US  GUIDE VASC ACCESS RIGHT  05/16/2023   IVC FILTER PLACEMENT ([facility] HX)     LOWER EXTREMITY VENOGRAPHY N/A 02/07/2021   Procedure: LOWER EXTREMITY VENOGRAPHY;  Surgeon: Kayla Part, MD;  Location: [facility] INVASIVE CV LAB;  Service: Cardiovascular;  Laterality: N/A;   PERIPHERAL VASCULAR THROMBECTOMY N/A 02/07/2021   Procedure: PERIPHERAL VASCULAR THROMBECTOMY;  Surgeon: Kayla Part, MD;  Location: [facility] INVASIVE CV LAB;  Service: Cardiovascular;  Laterality: N/A;   RIGHT HEART CATH N/A 03/08/2022   Procedure: RIGHT HEART CATH;  Surgeon: Alwin Baars, DO;  Location: [facility] INVASIVE CV LAB;  Service: Cardiovascular;  Laterality: N/A;   RIGHT HEART CATH N/A 10/04/2022   Procedure: RIGHT HEART CATH;  Surgeon: Alwin Baars, DO;  Location: [facility] INVASIVE CV LAB;  Service: Cardiovascular;  Laterality: N/A;   RIGHT HEART CATH N/A 05/13/2023   Procedure: RIGHT HEART CATH;  Surgeon: [name], MD;  Location: [facility] INVASIVE CV LAB;  Service: Cardiovascular;  Laterality: N/A;   RIGHT HEART CATH N/A 06/02/2023   Procedure: RIGHT HEART  CATH;  Surgeon: Mardell Shade, MD;  Location: [facility] INVASIVE CV LAB;  Service: Cardiovascular;  Laterality: N/A;   RIGHT/LEFT HEART CATH AND CORONARY ANGIOGRAPHY N/A 06/19/2021   Procedure: RIGHT/LEFT HEART CATH AND CORONARY ANGIOGRAPHY;  Surgeon: Arty Binning, MD;  Location: [facility] INVASIVE CV LAB;  Service: Cardiovascular;  Laterality: N/A;   TEE WITHOUT CARDIOVERSION N/A 03/08/2022   Procedure: TRANSESOPHAGEAL ECHOCARDIOGRAM (TEE);  Surgeon: Alwin Baars, DO;  Location: [facility] ENDOSCOPY;  Service: Cardiovascular;  Laterality: N/A;   TRANSESOPHAGEAL ECHOCARDIOGRAM (CATH LAB) N/A 06/02/2023   Procedure: TRANSESOPHAGEAL ECHOCARDIOGRAM;  Surgeon: Mardell Shade, MD;  Location: [facility] INVASIVE CV LAB;  Service: Cardiovascular;  Laterality: N/A;    Family History: Family History  Problem Relation Age of Onset   Hypertension Mother    Diabetes Paternal Aunt    Diabetes Paternal Uncle    Other Father        blood clots   Cancer Maternal Grandmother    Heart attack Paternal Grandmother    Colon cancer Neg Hx  Social History: Social History   Socioeconomic History   Marital status: Widowed    Spouse name: Not on file   Number of children: Not on file   Years of education: Not on file   Highest education level: Some college, no degree  Occupational History   Not on file  Tobacco Use   Smoking status: Every Day    Types: Cigarettes    Passive exposure: Never   Smokeless tobacco: Never   Tobacco comments:    Smokes 2-3 cigarettes daily as of 02/13/22  Vaping Use   Vaping status: Never Used  Substance and Sexual Activity   Alcohol  use: Yes    Alcohol /week: 2.0 standard drinks of alcohol     Types: 2 Cans of beer per week    Comment: twice a week   Drug use: No   Sexual activity: Not Currently    Birth control/protection: Pill, None  Other Topics Concern   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    Has a 47 year old  [place].   Live with parents.   Was working 5 days a weeks.   Not working right now.    Attends church.    Social Drivers of Corporate investment banker Strain: Low Risk  (04/03/2023)   Overall Financial Resource Strain (CARDIA)    Difficulty of Paying Living Expenses: Not very hard  Food Insecurity: No Food Insecurity (05/20/2023)   Hunger Vital Sign    Worried About Running Out of Food in the Last Year: Never true    Ran Out of Food in the Last Year: Never true  Transportation Needs: No Transportation Needs (05/20/2023)   PRAPARE - Administrator, Civil Service (Medical): No    Lack of Transportation (Non-Medical): No  Physical Activity: Insufficiently Active (04/03/2023)   Exercise Vital Sign    Days of Exercise per Week: 2 days    Minutes of Exercise per Session: 10 min  Stress: No Stress Concern Present (04/03/2023)   [employer] of Occupational Health - Occupational Stress Questionnaire    Feeling of Stress : Only a little  Social Connections: Socially Isolated (04/03/2023)   Social Connection and Isolation Panel    Frequency of Communication with Friends and Family: Twice a week    Frequency of Social Gatherings with Friends and Family: Twice a week    Attends Religious Services: Never    Database administrator or Organizations: No    Attends Banker Meetings: Never    Marital Status: Widowed    Allergies:  Allergies  Allergen Reactions   Sulfa  Antibiotics Itching, Swelling and Rash    Lip swelling    Objective:    Vital Signs:   Temp:  [98.3 F (36.8 C)] 98.3 F (36.8 C) (06/18 0636) Pulse Rate:  [94] 94 (06/18 0636) Resp:  [18] 18 (06/18 0636) BP: (134)/(97) 134/97 (06/18 0636) SpO2:  [97 %] 97 % (06/18 0636) Weight:  [110.2 kg] 110.2 kg (06/18 0636)    Weight change: Filed Weights   07/09/23 0636  Weight: 110.2 kg    Intake/Output:   Intake/Output Summary (Last 24 hours) at 07/09/2023 1340 Last data filed at 07/09/2023  1207 Gross per 24 hour  Intake 950 ml  Output --  Net 950 ml      Physical Exam     Unable to assess. Intubated and sedated in sterile field  in the cath lab.   Labs   Basic Metabolic Panel: Recent Labs  Lab 07/04/23 1057  NA 132*  K 3.5  CL 95*  CO2 28  GLUCOSE 138*  BUN 15  CREATININE 0.80  CALCIUM  9.1    Liver Function Tests: Recent Labs  Lab 07/04/23 1057  AST 25  ALT 13  ALKPHOS 86  BILITOT 1.6*  PROT 7.5  ALBUMIN 3.2*   No results for input(s): LIPASE, AMYLASE in the last 168 hours. No results for input(s): AMMONIA in the last 168 hours.  CBC: Recent Labs  Lab 07/04/23 1057  WBC 3.5*  NEUTROABS 2.3  HGB 12.9  HCT 41.6  MCV 85.6  PLT 188    Cardiac Enzymes: No results for input(s): CKTOTAL, CKMB, CKMBINDEX, TROPONINI in the last 168 hours.  BNP: BNP (last 3 results) Recent Labs    05/28/23 1224 06/30/23 1218 07/04/23 1057  BNP 1,155.3* 600.6* 612.0*    ProBNP (last 3 results) No results for input(s): PROBNP in the last 8760 hours.   CBG: No results for input(s): GLUCAP in the last 168 hours.  Coagulation Studies: Recent Labs    07/09/23 0648  LABPROT 16.1*  INR 1.3*     Imaging   DG Chest 2 View Result Date: 07/09/2023 CLINICAL DATA:  Preop chest exam prior to mitral valve repair. EXAM: CHEST - 2 VIEW COMPARISON:  05/13/2023 FINDINGS: Unchanged mild cardiomegaly and mild pulmonary vascular congestion. Lungs are clear. Interval retraction of RIGHT IJ tunneled catheter with tip now terminating in the region of the RIGHT brachiocephalic vein. IMPRESSION: 1. Unchanged cardiomegaly and mild pulmonary vascular congestion. 2. Retracted tunneled RIGHT IJ catheter. Electronically Signed   By: Elester Grim M.D.   On: 07/09/2023 09:07     Medications:     Current Medications:   Infusions:  sodium chloride      lactated ringers         Patient Profile   48 y.o. female with history of chronic systolic HF  with biventricular dysfunction on home inotrope, severe MR, hx PE/DVT, obesity, CAD, anemia, tobacco use. Presented for mTEER on 07/09/23. Procedure c/b torn mitral valve leaflet resulting in torrential MR and development of cardiogenic shock.  Assessment/Plan   Acute on chronic systolic HF with biventricular dysfunction>>>Cardiogenic shock - stage D cardiomyopathy - LHC in 2023 with nonobstructive CAD.  - RHC 4/25 (on 7.5 DBA): Severely elevated biventricular filling pressures, severe post capillary PH, normal CO by TD. - Echo 4/25 EF 25-30% moderately reduced RV with mod/sev MR  - TEE 05/25: EF 40-45%, RV moderately reduced, severe BAE, severe posterior MR - RHC 05/25 on milrinone  0.25: severe pulmonary venous HTN, severely elevated PCWP with prominent v waves, preserved CO.  - Presented for mTEER 07/09/23. Procedure complicated by torn mitral valve leaflet resulting in torrential MR and cardiogenic shock. IABP placed in cath lab. Will need to stabilize and eventually decide if will be candidate for LVAD (RV may be prohibitive). Not a candidate for transplant with tobacco use.   2. Severe MR - See above  3. Acute hypoxic respiratory failure - In setting of acute on chronic CHF and severe/torrential MR -Vent management per CCM   4. Hx of PE/DVT w/ IVC filter - Submassive saddle PE extending from the distal main pulmonary artery and to the left and right pulmonary arteries with extension into the left upper lobe, left lower lobe and lingula branches and right upper lobe, right middle lobe and right lower lobes on 04/27/2013  -  Evaluated at [facility] where thrombophilia work-up was negative (Lupus inhibitor, anticardiolipin antibodies, and anti beta-2  glycoprotein antibodies were negative; protein C activity 85%, protein S activity 76%, factor V Leiden, and prothrombin gene mutation ) - IVC filter in place; could not be removed due to significant thrombus burden on filter at [facility].  - Previously advised  against DOACs due to severity of thrombus burden and weight >250lbs.  - Heparin  gtt for now with IABP>>eventually back to Warfarin   5. Obesity  - Body mass index is 34.87 kg/m2 - BMI significantly down with diuresis - consider GLP1-RA at future follow-up   6. CAD - LHC in 5/23 w/ nonobstructive CAD involving RCA - No aspirin  with need for anticoagulation. Continue statin.   7. Iron  deficiency anemia - Recieved IV iron  4/25   8. Tobacco use - Smoking 3 cigarettes/day at time of last follow-up. Unable to assess currently.    Length of Stay: 0  Bj Morlock N, PA-C  07/09/2023, 1:40 PM    Advanced Heart Failure Team Pager [phone] (M-F; 7a - 5p)  Please contact [facility] for night-coverage after hours (4p -7a ) and weekends on [email or web address]

## 2023-07-10 ENCOUNTER — Inpatient Hospital Stay (HOSPITAL_COMMUNITY)

## 2023-07-10 ENCOUNTER — Encounter (HOSPITAL_COMMUNITY): Payer: Self-pay | Admitting: Cardiovascular Disease

## 2023-07-10 ENCOUNTER — Inpatient Hospital Stay (HOSPITAL_COMMUNITY): Admitting: Anesthesiology

## 2023-07-10 ENCOUNTER — Ambulatory Visit (HOSPITAL_COMMUNITY): Admitting: Cardiology

## 2023-07-10 ENCOUNTER — Other Ambulatory Visit: Payer: Self-pay

## 2023-07-10 ENCOUNTER — Encounter (HOSPITAL_COMMUNITY)
Admission: RE | Disposition: A | Discharge status: Home Health | Payer: Self-pay | Source: Ambulatory Visit | Attending: Surgery

## 2023-07-10 DIAGNOSIS — I340 Nonrheumatic mitral (valve) insufficiency: Secondary | ICD-10-CM

## 2023-07-10 DIAGNOSIS — R570 Cardiogenic shock: Secondary | ICD-10-CM

## 2023-07-10 DIAGNOSIS — F17210 Nicotine dependence, cigarettes, uncomplicated: Secondary | ICD-10-CM | POA: Diagnosis not present

## 2023-07-10 DIAGNOSIS — I5082 Biventricular heart failure: Secondary | ICD-10-CM | POA: Diagnosis not present

## 2023-07-10 DIAGNOSIS — I2510 Atherosclerotic heart disease of native coronary artery without angina pectoris: Secondary | ICD-10-CM

## 2023-07-10 DIAGNOSIS — J9601 Acute respiratory failure with hypoxia: Secondary | ICD-10-CM | POA: Diagnosis not present

## 2023-07-10 DIAGNOSIS — G4733 Obstructive sleep apnea (adult) (pediatric): Secondary | ICD-10-CM

## 2023-07-10 HISTORY — PX: INTRAOPERATIVE TRANSESOPHAGEAL ECHOCARDIOGRAM: SHX5062

## 2023-07-10 HISTORY — PX: PLACEMENT OF IMPELLA LEFT VENTRICULAR ASSIST DEVICE: SHX6519

## 2023-07-10 LAB — BASIC METABOLIC PANEL WITH GFR
Anion gap: 12 (ref 5–15)
Anion gap: 11 (ref 5–15)
BUN: 13 mg/dL (ref 6–20)
BUN: 16 mg/dL (ref 6–20)
CO2: 25 mmol/L (ref 22–32)
CO2: 26 mmol/L (ref 22–32)
Calcium: 8.8 mg/dL — ABNORMAL LOW (ref 8.9–10.3)
Calcium: 8.9 mg/dL (ref 8.9–10.3)
Chloride: 101 mmol/L (ref 98–111)
Chloride: 101 mmol/L (ref 98–111)
Creatinine, Ser: 1.14 mg/dL — ABNORMAL HIGH (ref 0.44–1.00)
Creatinine, Ser: 1.27 mg/dL — ABNORMAL HIGH (ref 0.44–1.00)
GFR, Estimated: >60 mL/min (ref >60)
GFR, Estimated: 53 mL/min — ABNORMAL LOW (ref >60)
Glucose, Bld: 111 mg/dL — ABNORMAL HIGH (ref 70–99)
Glucose, Bld: 104 mg/dL — ABNORMAL HIGH (ref 70–99)
Potassium: 3.6 mmol/L (ref 3.5–5.1)
Potassium: 3.1 mmol/L — ABNORMAL LOW (ref 3.5–5.1)
Sodium: 138 mmol/L (ref 135–145)
Sodium: 138 mmol/L (ref 135–145)

## 2023-07-10 LAB — HEPARIN LEVEL (UNFRACTIONATED): Heparin Unfractionated: <0.1 [IU]/mL — ABNORMAL LOW (ref 0.30–0.70)

## 2023-07-10 LAB — ECHO INTRAOPERATIVE TEE
Height: 70 in
Weight: 3929.48 [oz_av]

## 2023-07-10 LAB — GLUCOSE, CAPILLARY
Glucose-Capillary: 120 mg/dL — ABNORMAL HIGH (ref 70–99)
Glucose-Capillary: 112 mg/dL — ABNORMAL HIGH (ref 70–99)
Glucose-Capillary: 100 mg/dL — ABNORMAL HIGH (ref 70–99)
Glucose-Capillary: 112 mg/dL — ABNORMAL HIGH (ref 70–99)
Glucose-Capillary: 101 mg/dL — ABNORMAL HIGH (ref 70–99)
Glucose-Capillary: 97 mg/dL (ref 70–99)

## 2023-07-10 LAB — POCT I-STAT 7, (LYTES, BLD GAS, ICA,H+H)
Acid-Base Excess: 4 mmol/L — ABNORMAL HIGH (ref 0.0–2.0)
Acid-Base Excess: 2 mmol/L (ref 0.0–2.0)
Bicarbonate: 26.6 mmol/L (ref 20.0–28.0)
Bicarbonate: 27.9 mmol/L (ref 20.0–28.0)
Calcium, Ion: 1.12 mmol/L — ABNORMAL LOW (ref 1.15–1.40)
Calcium, Ion: 1.15 mmol/L (ref 1.15–1.40)
HCT: 37 % (ref 36.0–46.0)
HCT: 37 % (ref 36.0–46.0)
Hemoglobin: 12.6 g/dL (ref 12.0–15.0)
Hemoglobin: 12.6 g/dL (ref 12.0–15.0)
O2 Saturation: 99 %
O2 Saturation: 99 %
Patient temperature: 37
Potassium: 4 mmol/L (ref 3.5–5.1)
Potassium: 3.3 mmol/L — ABNORMAL LOW (ref 3.5–5.1)
Sodium: 138 mmol/L (ref 135–145)
Sodium: 139 mmol/L (ref 135–145)
TCO2: 28 mmol/L (ref 22–32)
TCO2: 29 mmol/L (ref 22–32)
pCO2 arterial: 34.2 mmHg (ref 32–48)
pCO2 arterial: 46.9 mmHg (ref 32–48)
pH, Arterial: 7.499 — ABNORMAL HIGH (ref 7.35–7.45)
pH, Arterial: 7.383 (ref 7.35–7.45)
pO2, Arterial: 132 mmHg — ABNORMAL HIGH (ref 83–108)
pO2, Arterial: 129 mmHg — ABNORMAL HIGH (ref 83–108)

## 2023-07-10 LAB — CBC
HCT: 37.9 % (ref 36.0–46.0)
Hemoglobin: 11.7 g/dL — ABNORMAL LOW (ref 12.0–15.0)
MCH: 26.2 pg (ref 26.0–34.0)
MCHC: 30.9 g/dL (ref 30.0–36.0)
MCV: 85 fL (ref 80.0–100.0)
Platelets: 154 10*3/uL (ref 150–400)
RBC: 4.46 MIL/uL (ref 3.87–5.11)
RDW: 21.7 % — ABNORMAL HIGH (ref 11.5–15.5)
WBC: 11.5 10*3/uL — ABNORMAL HIGH (ref 4.0–10.5)
nRBC: 0 % (ref 0.0–0.2)

## 2023-07-10 LAB — TRIGLYCERIDES: Triglycerides: 73 mg/dL (ref <150)

## 2023-07-10 LAB — COOXEMETRY PANEL
Carboxyhemoglobin: 1.8 % — ABNORMAL HIGH (ref 0.5–1.5)
Methemoglobin: <0.7 % (ref 0.0–1.5)
O2 Saturation: 79.4 %
Total hemoglobin: 12.1 g/dL (ref 12.0–16.0)

## 2023-07-10 LAB — POCT ACTIVATED CLOTTING TIME
Activated Clotting Time: 291 s
Activated Clotting Time: 158 s

## 2023-07-10 LAB — MAGNESIUM: Magnesium: 2.1 mg/dL (ref 1.7–2.4)

## 2023-07-10 SURGERY — INSERTION, CARDIAC ASSIST DEVICE, IMPELLA
Anesthesia: General | Site: Axilla

## 2023-07-10 MED ORDER — LACTATED RINGERS IV SOLN: INTRAVENOUS | Status: DC | PRN

## 2023-07-10 MED ORDER — SODIUM BICARBONATE 8.4 % IV SOLN: INTRAVENOUS | Status: DC | PRN

## 2023-07-10 MED ORDER — FENTANYL 2500MCG IN NS 250ML (10MCG/ML) PREMIX INFUSION
0.0000 ug/h | INTRAVENOUS | Status: DC
  Administered 2023-07-10: 50 ug/h via INTRAVENOUS
  Filled 2023-07-10: qty 250

## 2023-07-10 MED ORDER — PROTAMINE SULFATE 10 MG/ML IV SOLN
50.0000 mg | INTRAVENOUS | Status: AC
  Administered 2023-07-10: 50 mg via INTRAVENOUS
  Filled 2023-07-10: qty 5

## 2023-07-10 MED ORDER — ROCURONIUM BROMIDE 10 MG/ML (PF) SYRINGE
PREFILLED_SYRINGE | INTRAVENOUS | Status: DC | PRN
  Administered 2023-07-10 (×2): 30 mg via INTRAVENOUS
  Administered 2023-07-10: 20 mg via INTRAVENOUS
  Administered 2023-07-10: 50 mg via INTRAVENOUS

## 2023-07-10 MED ORDER — SENNA 8.6 MG PO TABS
2.0000 | ORAL_TABLET | Freq: Every day | ORAL | Status: DC
  Administered 2023-07-10: 17.2 mg
  Filled 2023-07-10: qty 2

## 2023-07-10 MED ORDER — PROPOFOL 10 MG/ML IV BOLUS
INTRAVENOUS | Status: AC
Start: 2023-07-10 — End: 2023-07-10
  Filled 2023-07-10: qty 20

## 2023-07-10 MED ORDER — POTASSIUM CHLORIDE 20 MEQ PO PACK: 40.0000 meq | PACK | Freq: Once | ORAL | Status: DC

## 2023-07-10 MED ORDER — HEMOSTATIC AGENTS (NO CHARGE) OPTIME: TOPICAL | Status: DC | PRN

## 2023-07-10 MED ORDER — POLYETHYLENE GLYCOL 3350 17 G PO PACK
17.0000 g | PACK | Freq: Two times a day (BID) | ORAL | Status: DC
  Administered 2023-07-10 (×2): 17 g
  Filled 2023-07-10 (×2): qty 1

## 2023-07-10 MED ORDER — HEPARIN 6000 UNIT IRRIGATION SOLUTION
Status: DC | PRN
  Administered 2023-07-10: 1

## 2023-07-10 MED ORDER — POTASSIUM CHLORIDE 20 MEQ PO PACK
60.0000 meq | PACK | Freq: Once | ORAL | Status: AC
  Administered 2023-07-10: 60 meq
  Filled 2023-07-10: qty 3

## 2023-07-10 MED ORDER — HEPARIN 6000 UNIT IRRIGATION SOLUTION
Status: AC
  Filled 2023-07-10: qty 500

## 2023-07-10 MED ORDER — METOLAZONE 5 MG PO TABS
5.0000 mg | ORAL_TABLET | Freq: Once | ORAL | Status: AC
  Administered 2023-07-10: 5 mg
  Filled 2023-07-10: qty 1

## 2023-07-10 MED ORDER — PROTAMINE SULFATE 10 MG/ML IV SOLN: 50.0000 mg | Freq: Once | INTRAVENOUS | Status: DC

## 2023-07-10 MED ORDER — 0.9 % SODIUM CHLORIDE (POUR BTL) OPTIME: TOPICAL | Status: DC | PRN

## 2023-07-10 MED ORDER — FENTANYL CITRATE (PF) 250 MCG/5ML IJ SOLN
INTRAMUSCULAR | Status: AC
  Filled 2023-07-10: qty 5

## 2023-07-10 MED ORDER — FENTANYL CITRATE (PF) 250 MCG/5ML IJ SOLN
INTRAMUSCULAR | Status: DC | PRN
  Administered 2023-07-10: 100 ug via INTRAVENOUS

## 2023-07-10 MED ORDER — CEFAZOLIN SODIUM 1 G IJ SOLR
INTRAMUSCULAR | Status: AC
  Filled 2023-07-10: qty 10

## 2023-07-10 MED ORDER — HEPARIN SODIUM (PORCINE) 1000 UNIT/ML IJ SOLN
INTRAMUSCULAR | Status: DC | PRN
  Administered 2023-07-10: 5000 [IU] via INTRAVENOUS
  Administered 2023-07-10: 15000 [IU] via INTRAVENOUS

## 2023-07-10 MED ORDER — CEFAZOLIN SODIUM-DEXTROSE 2-3 GM-%(50ML) IV SOLR
INTRAVENOUS | Status: DC | PRN
  Administered 2023-07-10: 2 g via INTRAVENOUS

## 2023-07-10 MED ORDER — NITROPRUSSIDE SODIUM 25 MG/ML IV SOLN
0.0000 ug/kg/min | INTRAVENOUS | Status: DC
  Administered 2023-07-10: 0.2 ug/kg/min via INTRAVENOUS
  Filled 2023-07-10: qty 2

## 2023-07-10 MED ORDER — POTASSIUM CHLORIDE 20 MEQ PO PACK
40.0000 meq | PACK | Freq: Once | ORAL | Status: AC
  Administered 2023-07-10: 40 meq
  Filled 2023-07-10: qty 2

## 2023-07-10 MED ORDER — SODIUM BICARBONATE 8.4 % IV SOLN
INTRAVENOUS | Status: DC
  Filled 2023-07-10 (×5): qty 25

## 2023-07-10 MED FILL — Fentanyl Citrate Preservative Free (PF) Inj 100 MCG/2ML: INTRAMUSCULAR | Qty: 2 | Status: AC

## 2023-07-10 SURGICAL SUPPLY — 51 items
BLADE CLIPPER SURG (BLADE) ×2 IMPLANT
BLADE SURG 11 STRL SS (BLADE) IMPLANT
CATH DIAG EXPO 6F AL1 (CATHETERS) ×2 IMPLANT
CATH INFINITI 5FR ANG PIGTAIL (CATHETERS) IMPLANT
CATH INFINITI 6F MPB2 (CATHETERS) ×2 IMPLANT
CLIP TI MEDIUM 24 (CLIP) IMPLANT
CLIP TI WIDE RED SMALL 24 (CLIP) IMPLANT
CONTAINER PROTECT SURGISLUSH (MISCELLANEOUS) ×2 IMPLANT
COVER BACK TABLE 60X90IN (DRAPES) IMPLANT
DRAIN CHANNEL 15F RND FF W/TCR (WOUND CARE) IMPLANT
DRAPE C-ARM 42X72 X-RAY (DRAPES) ×4 IMPLANT
DRAPE CV SPLIT W-CLR ANES SCRN (DRAPES) ×2 IMPLANT
DRAPE PERI GROIN 82X75IN TIB (DRAPES) ×2 IMPLANT
DRAPE WARM FLUID 44X44 (DRAPES) ×2 IMPLANT
DRSG COVADERM 4X6 (GAUZE/BANDAGES/DRESSINGS) IMPLANT
EVACUATOR SILICONE 100CC (DRAIN) IMPLANT
GAUZE 4X4 16PLY ~~LOC~~+RFID DBL (SPONGE) ×2 IMPLANT
GAUZE SPONGE 4X4 12PLY STRL (GAUZE/BANDAGES/DRESSINGS) IMPLANT
GOWN STRL REUS W/ TWL LRG LVL3 (GOWN DISPOSABLE) ×8 IMPLANT
GOWN STRL REUS W/ TWL XL LVL3 (GOWN DISPOSABLE) IMPLANT
GRAFT VASC STRG 30X10STRL (Graft) IMPLANT
HEMOSTAT SURGICEL 2X14 (HEMOSTASIS) IMPLANT
INSERT FOGARTY SM (MISCELLANEOUS) ×2 IMPLANT
KIT BASIN OR (CUSTOM PROCEDURE TRAY) ×2 IMPLANT
LOOP VASCLR MAXI BLUE 18IN ST (MISCELLANEOUS) ×2 IMPLANT
LOOPS VASCLR MAXI BLUE 18IN ST (MISCELLANEOUS) ×2 IMPLANT
NS IRRIG 1000ML POUR BTL (IV SOLUTION) ×8 IMPLANT
PACK CHEST (CUSTOM PROCEDURE TRAY) ×2 IMPLANT
PAD ARMBOARD POSITIONER FOAM (MISCELLANEOUS) ×4 IMPLANT
PAD ELECT DEFIB RADIOL ZOLL (MISCELLANEOUS) ×2 IMPLANT
PENCIL BUTTON HOLSTER BLD 10FT (ELECTRODE) IMPLANT
PUMP SET IMPELLA 5.5 US (CATHETERS) ×2 IMPLANT
SEALANT HEMOST PREVELEAK 4ML (HEMOSTASIS) IMPLANT
SPONGE T-LAP 18X18 ~~LOC~~+RFID (SPONGE) ×8 IMPLANT
SPONGE T-LAP 4X18 ~~LOC~~+RFID (SPONGE) ×2 IMPLANT
SUT PROLENE 5 0 C 1 36 (SUTURE) IMPLANT
SUT PROLENE 5 0 C1 (SUTURE) ×4 IMPLANT
SUT SILK 1 MH (SUTURE) ×8 IMPLANT
SUT SILK 1 TIES 10X30 (SUTURE) ×2 IMPLANT
SUT SILK 2 0 SH CR/8 (SUTURE) IMPLANT
SUT SILK 3 0 TIES 10X30 (SUTURE) IMPLANT
SUT SILK 3-0 18XBRD TIE BLK (SUTURE) ×2 IMPLANT
SUT VIC AB 2-0 CT1 TAPERPNT 27 (SUTURE) IMPLANT
SUT VIC AB 3-0 SH 27X BRD (SUTURE) IMPLANT
SUT VIC AB 3-0 X1 27 (SUTURE) IMPLANT
TOWEL GREEN STERILE (TOWEL DISPOSABLE) ×2 IMPLANT
TOWEL GREEN STERILE FF (TOWEL DISPOSABLE) ×2 IMPLANT
TUBE SUCTION CARDIAC 10FR (CANNULA) IMPLANT
VASCULAR TIE MINI RED 18IN STL (MISCELLANEOUS) ×2 IMPLANT
WATER STERILE IRR 1000ML POUR (IV SOLUTION) ×4 IMPLANT
WIRE EMERALD 3MM-J .035X260CM (WIRE) IMPLANT

## 2023-07-10 NOTE — Anesthesia Preprocedure Evaluation (Signed)
 Anesthesia Evaluation  Patient identified by MRN, date of birth, ID band Patient awake    Reviewed: Allergy & Precautions, NPO status , Patient's Chart, lab work & pertinent test results  Airway Mallampati: Intubated  TM Distance: >3 FB Neck ROM: Full    Dental no notable dental hx.    Pulmonary sleep apnea and Continuous Positive Airway Pressure Ventilation , Current Smoker and Patient abstained from smoking., PE   Pulmonary exam normal        Cardiovascular hypertension, + CAD, + Past MI and +CHF  + dysrhythmias Atrial Fibrillation + Valvular Problems/Murmurs MR  Rhythm:Regular Rate:Normal + Systolic murmurs IMPRESSIONS   1. Left ventricular ejection fraction, by estimation, is 40 to 45%. The left ventricle has mildly decreased function.  2. Right ventricular systolic function is moderately reduced. The right ventricular size is normal.  3. Left atrial size was severely dilated. No left atrial/left atrial appendage thrombus was detected.  4. Right atrial size was severely dilated.  5. Posterior leaflet restriction with severe posterior MR and systolic flow reversal in pulmonary veins. PISA 1.3 cm. MR volume estimated at 93ml with ERO 0.82cm2. The mitral valve is abnormal. Severe mitral valve regurgitation.  6. The aortic valve is normal in structure. Aortic valve regurgitation is not visualized.    Neuro/Psych negative neurological ROS     GI/Hepatic negative GI ROS, Neg liver ROS,,,  Endo/Other  negative endocrine ROS    Renal/GU negative Renal ROS     Musculoskeletal   Abdominal Normal abdominal exam  (+)   Peds  Hematology  (+) Blood dyscrasia, anemia Lab Results      Component                Value               Date                      WBC                      3.5 (L)             07/04/2023                HGB                      12.9                07/04/2023                HCT                      41.6                 07/04/2023                MCV                      85.6                07/04/2023                PLT                      188                 07/04/2023  Lab Results      Component                Value               Date                      NA                       132 (L)             07/04/2023                K                        3.5                 07/04/2023                CO2                      28                  07/04/2023                GLUCOSE                  138 (H)             07/04/2023                BUN                      15                  07/04/2023                CREATININE               0.80                07/04/2023                CALCIUM                   9.1                 07/04/2023                EGFR                     66                  09/16/2022                GFRNONAA                 >60                 07/04/2023              Anesthesia Other Findings   Reproductive/Obstetrics                             Anesthesia Physical Anesthesia Plan  ASA: 4  Anesthesia Plan: General   Post-op Pain Management:    Induction: Intravenous  PONV Risk Score and Plan: 2 and Ondansetron , Dexamethasone and Treatment may vary  due to age or medical condition  Airway Management Planned: Oral ETT  Additional Equipment: Arterial line, CVP and TEE  Intra-op Plan:   Post-operative Plan: Post-operative intubation/ventilation  Informed Consent: I have reviewed the patients History and Physical, chart, labs and discussed the procedure including the risks, benefits and alternatives for the proposed anesthesia with the patient or authorized representative who has indicated his/her understanding and acceptance.     Dental advisory given  Plan Discussed with: CRNA  Anesthesia Plan Comments:        Anesthesia Quick Evaluation

## 2023-07-10 NOTE — Progress Notes (Signed)
 PHARMACY - ANTICOAGULATION CONSULT NOTE  Pharmacy Consult for heparin  Indication: IABP and h/o PE  Labs: Recent Labs    07/09/23 0648 07/09/23 1114 07/09/23 1456 07/09/23 1507 07/09/23 1625 07/09/23 1819 07/09/23 2053 07/09/23 2306  HGB  --    < > 13.5 15.3* 15.0 13.9  --   --   HCT  --    < > 44.6 45.0 44.0 41.0  --   --   PLT  --   --  218  --   --   --   --   --   LABPROT 16.1*  --  16.2*  --   --   --   --   --   INR 1.3*  --  1.3*  --   --   --   --   --   HEPARINUNFRC  --   --   --   --   --   --   --  <0.10*  CREATININE  --   --  1.20*  --   --   --  1.17*  --    < > = values in this interval not displayed.   Assessment: 48yo female subtherapeutic on heparin  with initial dosing while warfarin on hold; no infusion issues or signs of bleeding per RN.  Goal of Therapy:  Heparin  level 0.3-0.5 units/ml   Plan:  Increase heparin  infusion conservatively by 2 units/kg/hr to 1100 units/hr. Check level in 6 hours.   Lonnie Roberts, PharmD, BCPS 07/10/2023 12:04 AM

## 2023-07-10 NOTE — Progress Notes (Signed)
 Advanced Heart Failure Rounding Note  Cardiologist: Alwin Baars, DO  Chief Complaint: Cardiogenic Shock Subjective:    6/17: mTEER with sev posterior leaflet restriction resulting in tear. IABP + PAC. PCWP 40, v up to 60  Coox 80%. On Milrinone  0.375 + Nitroprusside 0.6 1.8L UOP (Net + 550 cc). Cr stable. On Lasix  20/hr.  Plan for Imp 5.5 implant today  Swan#s: PAP: (49-119)/(23-54) 55/28 CVP:  [0 mmHg-19 mmHg] 10 mmHg CO:  [7.1 L/min-9.6 L/min] 7.1 L/min CI:  [3.13 L/min/m2-4.2 L/min/m2] 3.13 L/min/m2  Intubated/sedated on FiO2 40%.  Objective:    Weight Range: 111.4 kg Body mass index is 35.24 kg/m.   Vital Signs:   Temp:  [97.2 F (36.2 C)-99.1 F (37.3 C)] 98.8 F (37.1 C) (06/19 0800) Pulse Rate:  [0-240] 192 (06/19 0800) Resp:  [0-34] 24 (06/19 0800) BP: (100-134)/(70-97) 100/70 (06/19 0800) SpO2:  [90 %-100 %] 99 % (06/19 0800) Arterial Line BP: (84-147)/(49-92) 92/54 (06/19 0800) FiO2 (%):  [40 %-100 %] 40 % (06/19 0800) Weight:  [111.4 kg] 111.4 kg (06/19 0349) Last BM Date :  (PTA)  Weight change: Filed Weights   07/09/23 0636 07/10/23 0349  Weight: 110.2 kg 111.4 kg   Intake/Output:  Intake/Output Summary (Last 24 hours) at 07/10/2023 0816 Last data filed at 07/10/2023 0723 Gross per 24 hour  Intake 2461.24 ml  Output 1925 ml  Net 536.24 ml    Physical Exam    CVP 10 General: Critical appearing Cardiac: S1 and S2 present. IABP inflaiton Resp: Coarse  Extremities: Warm and dry.  1+ edema.  Neuro: Sedated Lines/Devices:  R fem art sheath + IABP, PAC, a line, ETT, OGT, foley  Telemetry   ST 100s, frequent PVCs (personally reviewed)  EKG    No new EKG to review  Labs    CBC Recent Labs    07/09/23 1456 07/09/23 1507 07/09/23 1819 07/10/23 0346  WBC 5.6  --   --  11.5*  NEUTROABS 5.2  --   --   --   HGB 13.5   < > 13.9 11.7*  HCT 44.6   < > 41.0 37.9  MCV 86.9  --   --  85.0  PLT 218  --   --  154   < > = values in  this interval not displayed.   Basic Metabolic Panel Recent Labs    16/10/96 2053 07/10/23 0345 07/10/23 0346  NA 137  --  138  K 3.6  --  3.6  CL 100  --  101  CO2 24  --  25  GLUCOSE 116*  --  111*  BUN 15  --  13  CREATININE 1.17*  --  1.14*  CALCIUM  8.6*  --  8.8*  MG 1.8 2.1  --    Liver Function Tests Recent Labs    07/09/23 1456  AST 31  ALT 14  ALKPHOS 86  BILITOT 1.6*  PROT 8.1  ALBUMIN 3.5   BNP (last 3 results) Recent Labs    05/28/23 1224 06/30/23 1218 07/04/23 1057  BNP 1,155.3* 600.6* 612.0*   Fasting Lipid Panel Recent Labs    07/10/23 0346  TRIG 73   Medications:    Scheduled Medications:  Chlorhexidine  Gluconate Cloth  6 each Topical Daily   docusate  100 mg Per Tube BID   insulin  aspart  0-9 Units Subcutaneous Q4H   metolazone   5 mg Per Tube Once   mouth rinse  15 mL Mouth Rinse  Q2H   pantoprazole  (PROTONIX ) IV  40 mg Intravenous Daily   polyethylene glycol  17 g Per Tube BID   senna  2 tablet Per Tube Daily   Infusions:  fentaNYL  infusion INTRAVENOUS 50 mcg/hr (07/10/23 0735)   furosemide  (LASIX ) 200 mg in dextrose  5 % 100 mL (2 mg/mL) infusion 20 mg/hr (07/10/23 4098)   heparin  1,250 Units/hr (07/10/23 0809)   milrinone  0.375 mcg/kg/min (07/10/23 0723)   nitroPRUSSide     propofol  (DIPRIVAN ) infusion 60 mcg/kg/min (07/10/23 0736)   PRN Medications: albuterol , fentaNYL  (SUBLIMAZE ) injection, mouth rinse  Patient Profile   48 y.o. female with history of chronic systolic HF with biventricular dysfunction on home inotrope, severe MR, hx PE/DVT, obesity, CAD, anemia, tobacco use. Presented for mTEER on 07/09/23. Procedure c/b torn mitral valve leaflet resulting in torrential MR and development of cardiogenic shock.   Assessment/Plan   Cardiogenic shock: due to torrential MR s/p mTEER complicated by severely restricted posterior leaflet that was damaged during repair attempt. Resulting in severe hypotension and hypoxia in cath lab.  IABP and PAC placed. Will need to stabilize and eventually decide if will be candidate for LVAD (RV may be prohibitive). Not a candidate for transplant with tobacco use. - In cath lab PCWP in 40s with V waves to 60s. PAC placed.   - vent management per CCM - Continue IABP1:1. Plan for upgrading MCS to Impella 5.5 this afternoon with TCTS - Continue milrinone  0.375 mcg/kg/min - Continue Nipride for afterload reduction. Aim for MAP 65. - Continue Lasix  20 mg/hr. Give Metolzone 5 mg today - Continue heparin  gtt  Acute on chronic systolic HF with biventricular dysfunction - stage D cardiomyopathy - nonobstructive CAD on cath 2023.  - RHC 4/25 (on 7.5 DBA): Severely elevated biventricular filling pressures, severe post capillary PH, normal CO by TD.  - Echo 4/25 EF 25-30% moderately reduced RV with mod/sev MR  - TEE 5/25: EF 40-45%, RV moderately reduced, severe BAE, severe posterior MR - RHC 5/25 on milrinone  0.25: severe pulmonary venous HTN, severely elevated PCWP with prominent v waves, preserved CO.  - On home Milrinone  0.375. - needs stabilization and LVAD candidacy determination   Torrential MR - See above   Acute hypoxic respiratory failure - In setting of acute on chronic CHF and severe/torrential MR - Vent management per CCM; keep intubated.    Hx of PE/DVT w/ IVC filter - Submassive saddle PE extending from the distal main pulmonary artery and to the left and right pulmonary arteries with extension into the left upper lobe, left lower lobe and lingula branches and right upper lobe, right middle lobe and right lower lobes on 04/27/2013  - Evaluated at [facility] where thrombophilia work-up was negative (Lupus inhibitor, anticardiolipin antibodies, and anti beta-2  glycoprotein antibodies were negative; protein C activity 85%, protein S activity 76%, factor V Leiden, and prothrombin gene mutation ) - IVC filter in place; could not be removed due to significant thrombus burden on filter at [facility].   - Previously advised against DOACs due to severity of thrombus burden and weight >250lbs.  - Heparin  gtt for now with IABP>>eventually back to Warfarin   CAD - LHC in 5/23 w/ nonobstructive CAD involving RCA - No aspirin  with need for anticoagulation.  6. Tobacco use - Smoking 3 cigarettes/day at time of last follow-up. Unable to assess currently.  Length of Stay: 1  CRITICAL CARE Performed by: [name] Spencer Peterkin  Total critical care time: 14 minutes  -Critical care time was exclusive  of separately billable procedures and treating other patients. -Critical care was necessary to treat or prevent imminent or life-threatening deterioration. -Critical care was time spent personally by me on the following activities: development of treatment plan with patient and/or surrogate as well as nursing, discussions with consultants, evaluation of patient's response to treatment, examination of patient, obtaining history from patient or surrogate, ordering and performing treatments and interventions, ordering and review of laboratory studies, ordering and review of radiographic studies, pulse oximetry and re-evaluation of patient's condition.  [name] Kadien Lineman, NP  07/10/2023, 8:16 AM  Advanced Heart Failure Team Pager [phone] (M-F; 7a - 5p)  Please contact [facility] for night-coverage after hours (5p -7a ) and weekends on [email or web address]

## 2023-07-10 NOTE — Plan of Care (Signed)
   Problem: Clinical Measurements: Goal: Will remain free from infection Outcome: Progressing Goal: Diagnostic test results will improve Outcome: Progressing

## 2023-07-10 NOTE — Progress Notes (Signed)
 Initial Nutrition Assessment  DOCUMENTATION CODES:   Obesity unspecified  INTERVENTION:  Monitor for diet advancement and tolerance Once diet is resumed, recommend initiation of : Ensure Plus High Protein po BID, each supplement provides 350 kcal and 20 grams of protein. MVI with minerals daily  NUTRITION DIAGNOSIS:  Increased nutrient needs related to acute illness as evidenced by estimated needs.  GOAL:  Patient will meet greater than or equal to 90% of their needs  MONITOR:  PO intake, Diet advancement, Labs, Weight trends  REASON FOR ASSESSMENT:  Consult Enteral/tube feeding initiation and management  ASSESSMENT:   Pt presented for mitral valve clip procedure. Procedure c/b severe residual MR and cardiogenic shock.  PMH significant for nonischemic cardiomyopathy, chronic biventricular HFrEF, obesity hypoventilation syndrome  6/18: s/p mTEER procedure with severe residual MR, cardiogenic shock, IABP placement 6/19: OR for impella placement 6/20: extubated  Checked in with pt at bedside. Pt still intubated at that time. Unable to obtain detailed nutrition related history.   Consult received for enteral nutrition initiation however pt is now extubated. Enteral access removed. Spoke with RN who reports pt passed yale swallow screen. Will add nutrition supplements to augment oral intake given acute illness and monitor for nutritional adequacy.   Per HF, needs stabilization and LVAD candidacy evaluation.   Admit weight: 110.2 kg Last measured weight (6/19): 111.4 kg No updated weight today  + edema: non-pitting generalized  Drains/lines: OGT  CVC double lumen A-line RIJ single lumen Impella L chest JP drain- 60ml output x24 hours  UOP: x24 hours I/O's: +364ml since admit  Medications: colace BID, SSI 0-9 units q4h, miralax  BID, senna daily,  Drips: Levo stopped this morning Propofol  stopped this morning  Labs:  Cr 1.30 GFR 51 CBG's 95-112 x24  hours  NUTRITION - FOCUSED PHYSICAL EXAM: Flowsheet Row Most Recent Value  Orbital Region No depletion  Upper Arm Region No depletion  Thoracic and Lumbar Region No depletion  Buccal Region Unable to assess  Temple Region No depletion  Clavicle Bone Region No depletion  Clavicle and Acromion Bone Region Mild depletion  Scapular Bone Region No depletion  Dorsal Hand Unable to assess  [handmits]  Patellar Region No depletion  [UTA R leg in stabilizer]  Anterior Thigh Region No depletion  Posterior Calf Region No depletion  Edema (RD Assessment) Mild  [non-pitting generalized]  Hair Reviewed  Eyes Unable to assess  Mouth Unable to assess  Skin Reviewed  Nails Unable to assess    Diet Order:   Diet Order             Diet NPO time specified  Diet effective now                   EDUCATION NEEDS:   No education needs have been identified at this time  Skin:  Skin Assessment: Reviewed RN Assessment (closed surgical incision to L chest)  Last BM:  unknown/PTA  Height:   Ht Readings from Last 1 Encounters:  07/09/23 5' 10 (1.778 m)    Weight:   Wt Readings from Last 1 Encounters:  07/10/23 111.4 kg    Ideal Body Weight:  68.2 kg  BMI:  Body mass index is 35.24 kg/m.  Estimated Nutritional Needs:   Kcal:  1900-2100  Protein:  100-115g  Fluid:  >/=1.9L  Royce Maris, RDN, LDN Clinical Nutrition See AMiON for contact information.

## 2023-07-10 NOTE — Progress Notes (Signed)
 1950: IABP pulled by Dr Alease Amend and manual pressure was held on femoral site by Dr Alease Amend & Andres Kea RN.  No hematoma present, vascular site level 0, distal pulse present, vital signs stable.

## 2023-07-10 NOTE — Progress Notes (Signed)
 PHARMACY - ANTICOAGULATION CONSULT NOTE  Pharmacy Consult for heparin  Indication: IABP  Allergies  Allergen Reactions   Sulfa  Antibiotics Itching, Swelling and Rash    Lip swelling    Patient Measurements: Height: 5' 10 (177.8 cm) Weight: 111.4 kg (245 lb 9.5 oz) IBW/kg (Calculated) : 68.5 HEPARIN  DW (KG): 93  Vital Signs: Temp: 99.1 F (37.3 C) (06/19 0702) Temp Source: Core (06/19 0400) BP: 104/76 (06/19 0400) Pulse Rate: 104 (06/19 0702)  Labs: Recent Labs    07/09/23 0648 07/09/23 1114 07/09/23 1456 07/09/23 1507 07/09/23 1625 07/09/23 1819 07/09/23 2053 07/09/23 2306 07/10/23 0346 07/10/23 0605  HGB  --    < > 13.5   < > 15.0 13.9  --   --  11.7*  --   HCT  --    < > 44.6   < > 44.0 41.0  --   --  37.9  --   PLT  --   --  218  --   --   --   --   --  154  --   LABPROT 16.1*  --  16.2*  --   --   --   --   --   --   --   INR 1.3*  --  1.3*  --   --   --   --   --   --   --   HEPARINUNFRC  --   --   --   --   --   --   --  <0.10*  --  <0.10*  CREATININE  --   --  1.20*  --   --   --  1.17*  --  1.14*  --    < > = values in this interval not displayed.    Estimated Creatinine Clearance: 83.4 mL/min (A) (by C-G formula based on SCr of 1.14 mg/dL (H)).   Medical History: Past Medical History:  Diagnosis Date   Anemia    Blood transfusion without reported diagnosis    CAD (coronary artery disease)    Nonobstructive   Closed left ankle fracture    August 30 2012   Clotting disorder (HCC)    DVT (deep venous thrombosis) (HCC)    L leg   Fibroid tumor    HFrEF (heart failure with reduced ejection fraction) (HCC)    Hidradenitis    Hypertension    Mitral regurgitation    Myocardial infarction (HCC)    NICM (nonischemic cardiomyopathy) (HCC)    NSVT (nonsustained ventricular tachycardia) (HCC)    Obesity    OSA on CPAP    Pulmonary embolism (HCC) 04/2013   Sleep apnea     Medications:  Scheduled:   Chlorhexidine  Gluconate Cloth  6 each Topical  Daily   docusate  100 mg Per Tube BID   insulin  aspart  0-9 Units Subcutaneous Q4H   metolazone   5 mg Per Tube Once   mouth rinse  15 mL Mouth Rinse Q2H   pantoprazole  (PROTONIX ) IV  40 mg Intravenous Daily   polyethylene glycol  17 g Per Tube BID   senna  2 tablet Per Tube Daily    Assessment: 8 yof who presented for mTEER complicated with torn mitral valve leaflet resulting in torrential MR and cardiogenic shock requiring IABP. On warfarin PTA for hx PE (LD 6/12) - was on enoxaparin  while warfarin on hold for procedure (LD 6/17).  Heparin  infusion for IABP restarted 2 hours after procedure 6/18  Heparin   level undetectable following heparin  infusion increase to 1100 units/hr. Hgb 11.7, plt 154 down trending. No signs/symptoms of bleeding or issues with the infusion noted.   Goal of Therapy:  Heparin  level 0.3-0.5 units/ml Monitor platelets by anticoagulation protocol: Yes   Plan:  Increase heparin  infusion to 1250 units/hr  Heparin  level in 6 hours  Monitor daily HL, CBC, and for s/sx of bleeding   Thank you for allowing pharmacy to participate in this patient's care,  Jerrel Mor, PharmD PGY1 Pharmacy Resident 07/10/2023 7:51 AM   Please check AMION for all [facility] phone numbers After 10:00 PM, call Main Pharmacy [phone]

## 2023-07-10 NOTE — Interval H&P Note (Signed)
 History and Physical Interval Note:  07/10/2023 12:20 PM  [name] [name]  has presented today for surgery, with the diagnosis of SEVERE MR CARDIOGENIC SHOCK.  The various methods of treatment have been discussed with the patient and family. After consideration of risks, benefits and other options for treatment, the patient has consented to  Procedure(s): INSERTION, CARDIAC ASSIST DEVICE, IMPELLA (N/A) ECHOCARDIOGRAM, TRANSESOPHAGEAL, INTRAOPERATIVE (N/A) as a surgical intervention.  The patient's history has been reviewed, patient examined, no change in status, stable for surgery.  I have reviewed the patient's chart and labs.  Questions were answered to the patient's satisfaction.     [name] [name]

## 2023-07-10 NOTE — Anesthesia Postprocedure Evaluation (Signed)
 Anesthesia Post Note  Patient: [name] [name]  Procedure(s) Performed: TRANSCATHETER MITRAL EDGE TO EDGE REPAIR TRANSESOPHAGEAL ECHOCARDIOGRAM IABP Insertion     Patient location during evaluation: SICU Anesthesia Type: General Level of consciousness: sedated Pain management: pain level controlled Vital Signs Assessment: post-procedure vital signs reviewed and stable Respiratory status: patient remains intubated per anesthesia plan Cardiovascular status: stable Postop Assessment: no apparent nausea or vomiting Anesthetic complications: yes   Encounter Notable Events  Notable Event Outcome Phase Comment  Cardiogenic shock Resolved in Lab Intraprocedure     Last Vitals:  Vitals:   07/10/23 1830 07/10/23 1845  BP:    Pulse: 96 (!) 101  Resp: (!) 24 (!) 24  Temp: (!) 36.4 C 36.5 C  SpO2: 100% 100%    Last Pain:  Vitals:   07/10/23 0400  TempSrc: Core  PainSc:                  Valente Gaskin Patrick Salemi

## 2023-07-10 NOTE — Progress Notes (Signed)
  Progress Note  Patient Name: [name] [name] Date of Encounter: 07/10/2023 [facility] Cardiologist: Alwin Baars, DO   Interval Summary   Intubated, sedated  Vital Signs Vitals:   07/10/23 0632 07/10/23 0645 07/10/23 0700 07/10/23 0702  BP:      Pulse: (!) 101 (!) 102 (!) 106 (!) 104  Resp: (!) 24 (!) 24 (!) 24 (!) 24  Temp: 99 F (37.2 C) 99 F (37.2 C) 99 F (37.2 C) 99.1 F (37.3 C)  TempSrc:      SpO2: 100% 100% 100% 100%  Weight:      Height:        Intake/Output Summary (Last 24 hours) at 07/10/2023 0800 Last data filed at 07/10/2023 0723 Gross per 24 hour  Intake 2461.24 ml  Output 1925 ml  Net 536.24 ml      07/10/2023    3:49 AM 07/09/2023    6:36 AM 06/30/2023   11:57 AM  Last 3 Weights  Weight (lbs) 245 lb 9.5 oz 243 lb 244 lb 6.4 oz  Weight (kg) 111.4 kg 110.224 kg 110.859 kg      Telemetry/ECG  Sinus tachycardia heart rate 105 bpm- Personally Reviewed  Physical Exam  GEN: No acute distress.  Sedated on the ventilator. Neck: No JVD Cardiac: Tachycardic and regular 2/6 holosystolic murmur heard throughout Respiratory: Clear to auscultation bilaterally. GI: Soft, nontender, non-distended  MS: No edema  Assessment & Plan  Acute on chronic HFrEF following transcatheter edge-to-edge repair procedure of the mitral valve, complicated by severe residual mitral regurgitation, as well as severe pulmonary hypertension and pulmonary edema with increased oxygen requirements.  Suspect decompensation related to torrential mitral regurgitation and acute volume overload/pulmonary edema.  Patient clinically improved today with intra-aortic balloon pump augmenting 1:1, positive pressure ventilation, IV furosemide  drip for diuresis, and sodium nitroprusside.  She continues on home milrinone  infusion.  Patient's case discussed at length with the advanced heart failure team.  Cardiac surgical notes reviewed.  Plans being discussed for placement of an Impella  5.5 for stabilization and further discussion about whether she may be a candidate for an LVAD.  Appreciate care of the heart failure team.     For questions or updates, please contact [facility] Please consult [email or web address] for contact info under       Signed, Arnoldo Lapping, MD

## 2023-07-10 NOTE — Plan of Care (Signed)
  Problem: Clinical Measurements: Goal: Ability to maintain clinical measurements within normal limits will improve Outcome: Progressing   Problem: Elimination: Goal: Will not experience complications related to urinary retention Outcome: Progressing   Problem: Pain Managment: Goal: General experience of comfort will improve and/or be controlled Outcome: Progressing   Problem: Safety: Goal: Ability to remain free from injury will improve Outcome: Progressing   Problem: Respiratory: Goal: Ability to maintain a clear airway and adequate ventilation will improve Outcome: Progressing   Problem: Nutrition: Goal: Adequate nutrition will be maintained Outcome: Not Progressing   Problem: Elimination: Goal: Will not experience complications related to bowel motility Outcome: Not Progressing   Problem: Activity: Goal: Ability to tolerate increased activity will improve Outcome: Not Progressing   Problem: Role Relationship: Goal: Method of communication will improve Outcome: Not Progressing

## 2023-07-10 NOTE — Op Note (Signed)
 CARDIOVASCULAR SURGERY OPERATIVE NOTE  07/10/2023  Surgeon:  Bartley Lightning, MD  First Assistant: Levora Reas PA-C: An experienced assistant was required given the complexity of this surgery and the standard of surgical care. The assistant was needed for exposure, dissection, suctioning, retraction of delicate tissues and sutures, instrument exchange and for overall help during this procedure.    Preoperative Diagnosis:  Cardiogenic shock secondary to low EF with severe mitral regurgitation.   Postoperative Diagnosis:  Same   Procedure:  Insertion of Impella 5.5 via left axillary artery.  Anesthesia:  General Endotracheal   Clinical History/Surgical Indication:  Critically ill 48 year old woman s/p mTEER with severe residual MR and cardiogenic shock. Currently has stabilized somewhat with IABP, milrinone , SNP, and Lasix  drips but it is felt that insertion of Impella 5.5 is indicated for maximum support to see if she will improve enough to be considered for permanent LVAD therapy. The operative procedure was discussed with the patient's family including alternatives, benefits and risks and they agree to proceed.  Preparation:  The patient was taken directed from the ICU to the OR. The consent was signed by me. Preoperative antibiotics were given.  After being placed under general endotracheal anesthesia by the anesthesia team the neck and chest were prepped with betadine soap and solution and draped in the usual sterile manner. A surgical time-out was taken and the correct patient and operative procedure were confirmed with the nursing and anesthesia staff.   Left axillary artery exposure and cannulation: Assisted by Levora Reas, PA-C  A transverse incision was made below the left clavicle. The pectoralis major muscle was split along its fibers and the pectoralis minor muscle was retracted laterally. The brachial plexus was identified and gently retracted laterally to expose  the axillary artery. The artery was controlled proximally and distally with vessel loops. The patient was heparinized and ACT maintained greater than 250. The axillary artery was clamped proximally and distally with peripheral Debakey clamps. It was opened longitudinally.  A 10 mm Hemashield dacron graft was anastomosed in an end to side manner using continuous 5-0 prolene suture. [place] was applied for hemostasis and the clamp removed.   Insertion of Impella 5.5: Assisted by Levora Reas, PA-C  The peel away sheath was inserted into the end of the graft and fastened with the sleeve connectors to provide hemostasis. A .035 J-wire was advanced through the sheath into the ascending aorta under fluoroscopic guidance using C-arm. A pig tail catheter was inserted over the wire and then advanced across the aortic valve without difficulty to the LV apex. The J-wire was changed out for 0.18 wire and it was positioned with a gentle curve in the LV apex. The pigtail was removed. Then the graft was clamped adjacent to the anastomosis and the 5.5 Impella inserted through the sheath. Before full insertion it was burped by removing the clamp on the graft temporarily. The Impella was advanced fully into the graft and the clamp removed. The Impella was advanced into the LV and the wire removed. Position was confirmed with TEE with the tip 5 cm below the aortic valve. The graft was again clamped and the graft was cut flush with the skin. The sheath was inserted into the end of the graft and it was fastened with 1-0 silk ties for hemostasis. The sheath was attached to the skin with 0-silk sutures.  The wound was hemostatic. A 15 Fr Blake drain was brought through a stab incision and positioned in the submuscular  space. The pectoralis muscle was approximated with continuous 2-0 Vicryl suture. The subcutaneous tissue was approximated with continuous 3-0 Vicryl suture. The skin was approximated with continuous 3-0 Vicryl  subcuticular suture.   All sponge, needle, and instrument counts were reported correct at the end of the case. Dry sterile dressings were placed over the incision. The Impella was anchored to the chest wall. The patient was then transported to the  intensive care unit in critical but stable condition.

## 2023-07-10 NOTE — Progress Notes (Signed)
 eLink Physician-Brief Progress Note Patient Name: [name] [name] DOB: 12-10-1975 MRN: [id number]   Date of Service  07/10/2023  HPI/Events of Note  Asked to review enteric catheter placement  eICU Interventions  Advance catheter by 10 cm, okay to use afterwards.  No additional imaging indicated.     Intervention Category Minor Interventions: Routine modifications to care plan (e.g. PRN medications for pain, fever)  Chastelyn Athens 07/10/2023, 9:28 PM

## 2023-07-10 NOTE — Consult Note (Signed)
 NAME:  [name] [name], MRN:  [id number], DOB:  22-Apr-1975, LOS: 1 ADMISSION DATE:  07/09/2023, CONSULTATION DATE:  07/09/23 REFERRING MD:  AHF, CHIEF COMPLAINT:  vent management   History of Present Illness:  Pt encephalopathic, therefore HPI obtained from EMR.  30 yoF with PMH as below significant for tobacco abuse, CAD, NICM, chronic biventricular HF on chronic milrinone , NSVT, OSA on CPAP, saddle DVT/ PE 2015 s/p IVC on coumadin  (IVC filter not able to be removed due to clot burden), non-compliance, pancytopenia (followed at [facility]) and obesity.    Previously admitted in April 2025 with cardiogenic shock, acute on chronic biventricular HF with overload, echo then with EF 20-25%, severe RV dysfunction, and mod-severe MR.  Was diuresed and discharged home on milrinone  with plans for outpt workup for advanced therapies.  Repeat outpt TEE 06/02/23 showed EF 40-45%, severely dilated  mod reduced RV, severe MR.  Returned today for [employer] today, however procedure complicated by posterior torn leaflet with torrential MR, worsening hypoxia requiring intubation, and cardiogenic shock.  PCCM consulted for assistance with MV management.    Pertinent  Medical History   Past Medical History:  Diagnosis Date   Anemia    Blood transfusion without reported diagnosis    CAD (coronary artery disease)    Nonobstructive   Closed left ankle fracture    August 30 2012   Clotting disorder [facility])    DVT (deep venous thrombosis) (HCC)    L leg   Fibroid tumor    HFrEF (heart failure with reduced ejection fraction) (HCC)    Hidradenitis    Hypertension    Mitral regurgitation    Myocardial infarction (HCC)    NICM (nonischemic cardiomyopathy) (HCC)    NSVT (nonsustained ventricular tachycardia) (HCC)    Obesity    OSA on CPAP    Pulmonary embolism (HCC) 04/2013   Sleep apnea    Significant Hospital Events: Including procedures, antibiotic start and stop dates in addition to other pertinent events   6/18  elective mTEER c/b torn leaflet, torriential MR with progressive hypoxia> intubated and cardiogenic shock requiring IABP 6/19 for impella 5.5  Off NE   Interim History / Subjective:   Coox 79 on 0.375 milrinone , 0.7 nitroprusside, and on lasix  gtt  K 3.6, Cr 1.14  CBC ok   Objective    Blood pressure 100/70, pulse (!) 209, temperature 98.6 F (37 C), resp. rate (!) 24, height 5' 10 (1.778 m), weight 111.4 kg, SpO2 100%. PAP: (46-119)/(23-54) 53/31 CVP:  [0 mmHg-19 mmHg] 7 mmHg PCWP:  [29 mmHg] 29 mmHg CO:  [7.1 L/min-9.6 L/min] 7.9 L/min CI:  [3.13 L/min/m2-4.2 L/min/m2] 3.49 L/min/m2  Vent Mode: PRVC FiO2 (%):  [40 %-100 %] 40 % Set Rate:  [16 bmp-24 bmp] 24 bmp Vt Set:  [540 mL] 540 mL PEEP:  [8 cmH20-10 cmH20] 10 cmH20 Plateau Pressure:  [20 cmH20-26 cmH20] 26 cmH20   Intake/Output Summary (Last 24 hours) at 07/10/2023 0955 Last data filed at 07/10/2023 0900 Gross per 24 hour  Intake 2610.01 ml  Output 2025 ml  Net 585.01 ml   Filed Weights   07/09/23 0636 07/10/23 0349  Weight: 110.2 kg 111.4 kg    Examination: General:  critically ill middle aged F intubated sedated NAD  Neuro: Sedated  HEENT: NCAT ETT secure  CV: holosytolic murmur. Tachycardic.  PULM:  CTAB, mechanically ventilated  GI: soft round  GU: foley  Extremities:  no obvious joint deformity  Skin: c/d/w  Resolved problem list   Assessment and Plan    Acute hypoxic resp failure  Pulm edema Tobacco abuse OSA on CPAP  -cont MV support, wean as able -- I weaned FiO2 to 40% 6/19  -defer WUA/SBT 6/19 morning -- her cardiac fxn is stable on current sedation, will keep as is at until she goes for Impella and we can re-eval further vent plan after   Severe MVR w flail leaflet  AoC BiV failure on chronic milrinone   Cardiogenic shock  CAD NICM  -Appreciate Adv HF management, CVTS recs  - IABP, milrinone , nitroprusside per HF  -case d/w HF -- plan for impella 5.5 6/19 - cont lasix  gtt, also  getting metolazone  6/19. replace K PRN  -trend coox and cardiac indices   AKI -cont lasix  gtt, foley, PRN Lyte replacement -follow renal indices, UOP   Hx DVT/PE, on coumadin  and s/p IVC filter -hep gtt   Inadequate PO intake -can probably start EN after impella 6/19   Best Practice (right click and Reselect all SmartList Selections daily)   Diet/type: NPO; DVT prophylaxis systemic heparin  Pressure ulcer(s): N/A GI prophylaxis: PPI Lines: Central line, Arterial Line, and yes and it is still needed Foley:  Yes, and it is still needed Code Status:  full code Last date of multidisciplinary goals of care discussion [per primary team]  Labs   CBC: Recent Labs  Lab 07/04/23 1057 07/09/23 1114 07/09/23 1456 07/09/23 1507 07/09/23 1625 07/09/23 1819 07/10/23 0346 07/10/23 0844  WBC 3.5*  --  5.6  --   --   --  11.5*  --   NEUTROABS 2.3  --  5.2  --   --   --   --   --   HGB 12.9   < > 13.5 15.3* 15.0 13.9 11.7* 12.6  HCT 41.6   < > 44.6 45.0 44.0 41.0 37.9 37.0  MCV 85.6  --  86.9  --   --   --  85.0  --   PLT 188  --  218  --   --   --  154  --    < > = values in this interval not displayed.    Basic Metabolic Panel: Recent Labs  Lab 07/04/23 1057 07/09/23 1114 07/09/23 1456 07/09/23 1507 07/09/23 1625 07/09/23 1819 07/09/23 2053 07/10/23 0345 07/10/23 0346 07/10/23 0844  NA 132*   < > 136   < > 138 138 137  --  138 138  K 3.5   < > 3.9   < > 3.8 3.7 3.6  --  3.6 4.0  CL 95*  --  97*  --   --   --  100  --  101  --   CO2 28  --  23  --   --   --  24  --  25  --   GLUCOSE 138*  --  149*  --   --   --  116*  --  111*  --   BUN 15  --  14  --   --   --  15  --  13  --   CREATININE 0.80  --  1.20*  --   --   --  1.17*  --  1.14*  --   CALCIUM  9.1  --  9.2  --   --   --  8.6*  --  8.8*  --   MG  --   --   --   --   --   --  1.8 2.1  --   --    < > = values in this interval not displayed.   GFR: Estimated Creatinine Clearance: 83.4 mL/min (A) (by C-G formula  based on SCr of 1.14 mg/dL (H)). Recent Labs  Lab 07/04/23 1057 07/09/23 1456 07/09/23 1504 07/09/23 1639 07/10/23 0346  WBC 3.5* 5.6  --   --  11.5*  LATICACIDVEN  --   --  0.9 1.3  --     Liver Function Tests: Recent Labs  Lab 07/04/23 1057 07/09/23 1456  AST 25 31  ALT 13 14  ALKPHOS 86 86  BILITOT 1.6* 1.6*  PROT 7.5 8.1  ALBUMIN 3.2* 3.5   No results for input(s): LIPASE, AMYLASE in the last 168 hours. No results for input(s): AMMONIA in the last 168 hours.  ABG    Component Value Date/Time   PHART 7.499 (H) 07/10/2023 0844   PCO2ART 34.2 07/10/2023 0844   PO2ART 132 (H) 07/10/2023 0844   HCO3 26.6 07/10/2023 0844   TCO2 28 07/10/2023 0844   O2SAT 99 07/10/2023 0844     Coagulation Profile: Recent Labs  Lab 07/04/23 1057 07/09/23 0648 07/09/23 1456  INR 1.8* 1.3* 1.3*    Cardiac Enzymes: No results for input(s): CKTOTAL, CKMB, CKMBINDEX, TROPONINI in the last 168 hours.  HbA1C: Hgb A1c MFr Bld  Date/Time Value Ref Range Status  05/08/2023 03:09 AM 6.0 (H) 4.8 - 5.6 % Final    Comment:    (NOTE) Pre diabetes:          5.7%-6.4%  Diabetes:              >6.4%  Glycemic control for   <7.0% adults with diabetes   09/16/2022 08:20 AM 5.5 4.8 - 5.6 % Final    Comment:             Prediabetes: 5.7 - 6.4          Diabetes: >6.4          Glycemic control for adults with diabetes: <7.0     CBG: Recent Labs  Lab 07/09/23 1611 07/09/23 2006 07/09/23 2323 07/10/23 0346 07/10/23 0730  GLUCAP 177* 124* 114* 120* 112*    CRITICAL CARE Performed by: Delories Fetter   Total critical care time: 38 minutes  Critical care time was exclusive of separately billable procedures and treating other patients. Critical care was necessary to treat or prevent imminent or life-threatening deterioration.  Critical care was time spent personally by me on the following activities: development of treatment plan with patient and/or surrogate as  well as nursing, discussions with consultants, evaluation of patient's response to treatment, examination of patient, obtaining history from patient or surrogate, ordering and performing treatments and interventions, ordering and review of laboratory studies, ordering and review of radiographic studies, pulse oximetry and re-evaluation of patient's condition.  Eston Hence MSN, AGACNP-BC [facility] Amion for pager  07/10/2023, 9:55 AM

## 2023-07-10 NOTE — Transfer of Care (Signed)
 Immediate Anesthesia Transfer of Care Note  Patient: [name] [name]  Procedure(s) Performed: INSERTION, CARDIAC ASSIST DEVICE, IMPELLA 5.5 (Left: Axilla) ECHOCARDIOGRAM, TRANSESOPHAGEAL, INTRAOPERATIVE  Patient Location: SICU  Anesthesia Type:General  Level of Consciousness: sedated and Patient remains intubated per anesthesia plan  Airway & Oxygen Therapy: Patient remains intubated per anesthesia plan and Patient placed on Ventilator (see vital sign flow sheet for setting)  Post-op Assessment: Report given to RN and Post -op Vital signs reviewed and stable  Post vital signs: stable  Last Vitals:  Vitals Value Taken Time  BP    Temp    Pulse 105 07/10/23 16:40  Resp 24 07/10/23 16:40  SpO2 98 % 07/10/23 16:40  Vitals shown include unfiled device data.  Last Pain:  Vitals:   07/10/23 0400  TempSrc: Core  PainSc:          Complications: No notable events documented.

## 2023-07-11 ENCOUNTER — Inpatient Hospital Stay (HOSPITAL_COMMUNITY)

## 2023-07-11 ENCOUNTER — Encounter (HOSPITAL_COMMUNITY): Payer: Self-pay | Admitting: Surgery

## 2023-07-11 DIAGNOSIS — J9601 Acute respiratory failure with hypoxia: Secondary | ICD-10-CM | POA: Diagnosis not present

## 2023-07-11 DIAGNOSIS — I5023 Acute on chronic systolic (congestive) heart failure: Secondary | ICD-10-CM

## 2023-07-11 DIAGNOSIS — R570 Cardiogenic shock: Secondary | ICD-10-CM | POA: Diagnosis not present

## 2023-07-11 DIAGNOSIS — I340 Nonrheumatic mitral (valve) insufficiency: Secondary | ICD-10-CM | POA: Diagnosis not present

## 2023-07-11 DIAGNOSIS — N179 Acute kidney failure, unspecified: Secondary | ICD-10-CM

## 2023-07-11 DIAGNOSIS — J810 Acute pulmonary edema: Secondary | ICD-10-CM | POA: Diagnosis not present

## 2023-07-11 DIAGNOSIS — G4733 Obstructive sleep apnea (adult) (pediatric): Secondary | ICD-10-CM | POA: Diagnosis not present

## 2023-07-11 LAB — BASIC METABOLIC PANEL WITH GFR
Anion gap: 11 (ref 5–15)
Anion gap: 9 (ref 5–15)
BUN: 19 mg/dL (ref 6–20)
BUN: 20 mg/dL (ref 6–20)
CO2: 25 mmol/L (ref 22–32)
CO2: 26 mmol/L (ref 22–32)
Calcium: 8.9 mg/dL (ref 8.9–10.3)
Calcium: 9 mg/dL (ref 8.9–10.3)
Chloride: 101 mmol/L (ref 98–111)
Chloride: 103 mmol/L (ref 98–111)
Creatinine, Ser: 1.25 mg/dL — ABNORMAL HIGH (ref 0.44–1.00)
Creatinine, Ser: 1.3 mg/dL — ABNORMAL HIGH (ref 0.44–1.00)
GFR, Estimated: 54 mL/min — ABNORMAL LOW (ref >60)
GFR, Estimated: 51 mL/min — ABNORMAL LOW (ref >60)
Glucose, Bld: 91 mg/dL (ref 70–99)
Glucose, Bld: 102 mg/dL — ABNORMAL HIGH (ref 70–99)
Potassium: 3.6 mmol/L (ref 3.5–5.1)
Potassium: 4 mmol/L (ref 3.5–5.1)
Sodium: 137 mmol/L (ref 135–145)
Sodium: 138 mmol/L (ref 135–145)

## 2023-07-11 LAB — CBC
HCT: 35.7 % — ABNORMAL LOW (ref 36.0–46.0)
Hemoglobin: 11 g/dL — ABNORMAL LOW (ref 12.0–15.0)
MCH: 26.5 pg (ref 26.0–34.0)
MCHC: 30.8 g/dL (ref 30.0–36.0)
MCV: 86 fL (ref 80.0–100.0)
Platelets: 129 10*3/uL — ABNORMAL LOW (ref 150–400)
RBC: 4.15 MIL/uL (ref 3.87–5.11)
RDW: 22.5 % — ABNORMAL HIGH (ref 11.5–15.5)
WBC: 7 10*3/uL (ref 4.0–10.5)
nRBC: 0 % (ref 0.0–0.2)

## 2023-07-11 LAB — COMPREHENSIVE METABOLIC PANEL WITH GFR
ALT: 9 U/L (ref 0–44)
AST: 26 U/L (ref 15–41)
Albumin: 2.9 g/dL — ABNORMAL LOW (ref 3.5–5.0)
Alkaline Phosphatase: 68 U/L (ref 38–126)
Anion gap: 13 (ref 5–15)
BUN: 20 mg/dL (ref 6–20)
CO2: 24 mmol/L (ref 22–32)
Calcium: 9.1 mg/dL (ref 8.9–10.3)
Chloride: 102 mmol/L (ref 98–111)
Creatinine, Ser: 1.29 mg/dL — ABNORMAL HIGH (ref 0.44–1.00)
GFR, Estimated: 52 mL/min — ABNORMAL LOW (ref >60)
Glucose, Bld: 114 mg/dL — ABNORMAL HIGH (ref 70–99)
Potassium: 3.5 mmol/L (ref 3.5–5.1)
Sodium: 139 mmol/L (ref 135–145)
Total Bilirubin: 1.9 mg/dL — ABNORMAL HIGH (ref 0.0–1.2)
Total Protein: 7 g/dL (ref 6.5–8.1)

## 2023-07-11 LAB — HEPARIN LEVEL (UNFRACTIONATED): Heparin Unfractionated: <0.1 [IU]/mL — ABNORMAL LOW (ref 0.30–0.70)

## 2023-07-11 LAB — GLUCOSE, CAPILLARY
Glucose-Capillary: 95 mg/dL (ref 70–99)
Glucose-Capillary: 97 mg/dL (ref 70–99)
Glucose-Capillary: 114 mg/dL — ABNORMAL HIGH (ref 70–99)
Glucose-Capillary: 120 mg/dL — ABNORMAL HIGH (ref 70–99)
Glucose-Capillary: 115 mg/dL — ABNORMAL HIGH (ref 70–99)
Glucose-Capillary: 82 mg/dL (ref 70–99)

## 2023-07-11 LAB — ECHOCARDIOGRAM LIMITED
Height: 70 in
Weight: 3929.48 [oz_av]

## 2023-07-11 LAB — COOXEMETRY PANEL
Carboxyhemoglobin: 1.7 % — ABNORMAL HIGH (ref 0.5–1.5)
Methemoglobin: <0.7 % (ref 0.0–1.5)
O2 Saturation: 74.9 %
Total hemoglobin: 11.5 g/dL — ABNORMAL LOW (ref 12.0–16.0)

## 2023-07-11 LAB — CG4 I-STAT (LACTIC ACID): Lactic Acid, Venous: 1 mmol/L (ref 0.5–1.9)

## 2023-07-11 LAB — MAGNESIUM: Magnesium: 2 mg/dL (ref 1.7–2.4)

## 2023-07-11 MED ORDER — POLYETHYLENE GLYCOL 3350 17 G PO PACK
17.0000 g | PACK | Freq: Two times a day (BID) | ORAL | Status: DC
  Administered 2023-07-11 – 2023-07-23 (×15): 17 g via ORAL
  Filled 2023-07-11 (×23): qty 1

## 2023-07-11 MED ORDER — SENNOSIDES-DOCUSATE SODIUM 8.6-50 MG PO TABS
2.0000 | ORAL_TABLET | Freq: Two times a day (BID) | ORAL | Status: DC
  Administered 2023-07-11: 2
  Filled 2023-07-11: qty 2

## 2023-07-11 MED ORDER — OXYCODONE HCL 5 MG PO TABS
5.0000 mg | ORAL_TABLET | Freq: Four times a day (QID) | ORAL | Status: DC | PRN
  Administered 2023-07-11 – 2023-07-17 (×7): 5 mg via ORAL
  Filled 2023-07-11 (×7): qty 1

## 2023-07-11 MED ORDER — NOREPINEPHRINE 4 MG/250ML-% IV SOLN: 0.0000 ug/min | INTRAVENOUS | Status: DC

## 2023-07-11 MED ORDER — FENTANYL CITRATE PF 50 MCG/ML IJ SOSY
25.0000 ug | PREFILLED_SYRINGE | INTRAMUSCULAR | Status: DC | PRN
  Administered 2023-07-11 – 2023-07-12 (×4): 25 ug via INTRAVENOUS
  Filled 2023-07-11 (×4): qty 1

## 2023-07-11 MED ORDER — ENSURE PLUS HIGH PROTEIN PO LIQD
237.0000 mL | Freq: Two times a day (BID) | ORAL | Status: DC
  Administered 2023-07-12 – 2023-07-15 (×4): 237 mL via ORAL

## 2023-07-11 MED ORDER — POTASSIUM CHLORIDE 20 MEQ PO PACK
40.0000 meq | PACK | Freq: Once | ORAL | Status: AC
  Administered 2023-07-11: 40 meq
  Filled 2023-07-11: qty 2

## 2023-07-11 MED ORDER — NOREPINEPHRINE 16 MG/250ML-% IV SOLN
0.0000 ug/min | INTRAVENOUS | Status: DC
  Administered 2023-07-11: 2 ug/min via INTRAVENOUS
  Filled 2023-07-11: qty 250

## 2023-07-11 MED ORDER — ORAL CARE MOUTH RINSE: 15.0000 mL | OROMUCOSAL | Status: DC | PRN

## 2023-07-11 MED ORDER — AMIODARONE HCL IN DEXTROSE 360-4.14 MG/200ML-% IV SOLN
60.0000 mg/h | INTRAVENOUS | Status: AC
  Administered 2023-07-11: 60 mg/h via INTRAVENOUS
  Filled 2023-07-11 (×2): qty 200

## 2023-07-11 MED ORDER — SENNA 8.6 MG PO TABS: 2.0000 | ORAL_TABLET | Freq: Two times a day (BID) | ORAL | Status: DC

## 2023-07-11 MED ORDER — AMIODARONE LOAD VIA INFUSION
150.0000 mg | Freq: Once | INTRAVENOUS | Status: AC
  Administered 2023-07-11: 150 mg via INTRAVENOUS
  Filled 2023-07-11: qty 83.34

## 2023-07-11 MED ORDER — SENNOSIDES-DOCUSATE SODIUM 8.6-50 MG PO TABS
2.0000 | ORAL_TABLET | Freq: Two times a day (BID) | ORAL | Status: DC
  Administered 2023-07-11 – 2023-07-23 (×20): 2 via ORAL
  Filled 2023-07-11 (×23): qty 2

## 2023-07-11 MED ORDER — POTASSIUM CHLORIDE CRYS ER 20 MEQ PO TBCR
60.0000 meq | EXTENDED_RELEASE_TABLET | Freq: Once | ORAL | Status: AC
  Administered 2023-07-11: 60 meq via ORAL
  Filled 2023-07-11: qty 3

## 2023-07-11 MED ORDER — ACETAMINOPHEN 325 MG PO TABS
650.0000 mg | ORAL_TABLET | Freq: Four times a day (QID) | ORAL | Status: DC | PRN
  Administered 2023-07-11 – 2023-07-19 (×3): 650 mg via ORAL
  Filled 2023-07-11 (×3): qty 2

## 2023-07-11 MED ORDER — AMIODARONE HCL IN DEXTROSE 360-4.14 MG/200ML-% IV SOLN
30.0000 mg/h | INTRAVENOUS | Status: DC
  Administered 2023-07-11 – 2023-07-17 (×12): 30 mg/h via INTRAVENOUS
  Filled 2023-07-11 (×13): qty 200

## 2023-07-11 MED ORDER — ADULT MULTIVITAMIN W/MINERALS CH: 1.0000 | ORAL_TABLET | Freq: Every day | ORAL | Status: DC

## 2023-07-11 MED ORDER — ADULT MULTIVITAMIN W/MINERALS CH
1.0000 | ORAL_TABLET | Freq: Every day | ORAL | Status: DC
  Administered 2023-07-12 – 2023-07-23 (×12): 1 via ORAL
  Filled 2023-07-11 (×12): qty 1

## 2023-07-11 MED ORDER — ENSURE PLUS HIGH PROTEIN PO LIQD: 237.0000 mL | Freq: Two times a day (BID) | ORAL | Status: DC

## 2023-07-11 MED ORDER — HEPARIN (PORCINE) 25000 UT/250ML-% IV SOLN
2300.0000 [IU]/h | INTRAVENOUS | Status: DC
  Administered 2023-07-11: 500 [IU]/h via INTRAVENOUS
  Administered 2023-07-13: 800 [IU]/h via INTRAVENOUS
  Administered 2023-07-14: 1100 [IU]/h via INTRAVENOUS
  Administered 2023-07-15: 1450 [IU]/h via INTRAVENOUS
  Administered 2023-07-15: 1650 [IU]/h via INTRAVENOUS
  Administered 2023-07-16: 2000 [IU]/h via INTRAVENOUS
  Administered 2023-07-17: 2200 [IU]/h via INTRAVENOUS
  Administered 2023-07-17 – 2023-07-21 (×9): 2250 [IU]/h via INTRAVENOUS
  Administered 2023-07-22: 2300 [IU]/h via INTRAVENOUS
  Administered 2023-07-22: 2250 [IU]/h via INTRAVENOUS
  Administered 2023-07-23 – 2023-07-24 (×3): 2300 [IU]/h via INTRAVENOUS
  Filled 2023-07-11 (×20): qty 250

## 2023-07-11 NOTE — Progress Notes (Signed)
 NAME:  [name] [name], MRN:  [id number], DOB:  August 06, 1975, LOS: 2 ADMISSION DATE:  07/09/2023, CONSULTATION DATE:  07/09/23 REFERRING MD:  AHF, CHIEF COMPLAINT:  vent management   History of Present Illness:  Pt encephalopathic, therefore HPI obtained from EMR.  27 yoF with PMH as below significant for tobacco abuse, CAD, NICM, chronic biventricular HF on chronic milrinone , NSVT, OSA on CPAP, saddle DVT/ PE 2015 s/p IVC on coumadin  (IVC filter not able to be removed due to clot burden), non-compliance, pancytopenia (followed at [facility]) and obesity.    Previously admitted in April 2025 with cardiogenic shock, acute on chronic biventricular HF with overload, echo then with EF 20-25%, severe RV dysfunction, and mod-severe MR.  Was diuresed and discharged home on milrinone  with plans for outpt workup for advanced therapies.  Repeat outpt TEE 06/02/23 showed EF 40-45%, severely dilated  mod reduced RV, severe MR.  Returned today for [employer] today, however procedure complicated by posterior torn leaflet with torrential MR, worsening hypoxia requiring intubation, and cardiogenic shock.  PCCM consulted for assistance with MV management.    Pertinent  Medical History   Past Medical History:  Diagnosis Date   Anemia    Blood transfusion without reported diagnosis    CAD (coronary artery disease)    Nonobstructive   Closed left ankle fracture    August 30 2012   Clotting disorder [facility])    DVT (deep venous thrombosis) (HCC)    L leg   Fibroid tumor    HFrEF (heart failure with reduced ejection fraction) (HCC)    Hidradenitis    Hypertension    Mitral regurgitation    Myocardial infarction (HCC)    NICM (nonischemic cardiomyopathy) (HCC)    NSVT (nonsustained ventricular tachycardia) (HCC)    Obesity    OSA on CPAP    Pulmonary embolism (HCC) 04/2013   Sleep apnea    Significant Hospital Events: Including procedures, antibiotic start and stop dates in addition to other pertinent events   6/18  elective mTEER c/b torn leaflet, torriential MR with progressive hypoxia> intubated and cardiogenic shock requiring IABP 6/19 for impella 5.5  Off NE  6/20 back on low dose NE, off lasix  gtt    Interim History / Subjective:   2L UOP yesterday, off lasix  gtt now  Coox 75 Cr 1.3 Hgb 11 plt 129  Started back on NE at 0200 ( ) continues on milrinone  0.375   Impella 5.5 P8   Seen at bedside w CVTS, also d/w cards and HF   Objective    Blood pressure 123/85, pulse 93, temperature 99.1 F (37.3 C), resp. rate (!) 33, height 5' 10 (1.778 m), weight 111.4 kg, SpO2 96%. PAP: (40-87)/(23-45) 76/28 CVP:  [1 mmHg-17 mmHg] 2 mmHg CO:  [6 L/min-7.9 L/min] 7.9 L/min CI:  [2.65 L/min/m2-3.46 L/min/m2] 3.46 L/min/m2  Vent Mode: PSV;CPAP FiO2 (%):  [40 %] 40 % Set Rate:  [24 bmp] 24 bmp Vt Set:  [540 mL] 540 mL PEEP:  [8 cmH20-10 cmH20] 8 cmH20 Pressure Support:  [8 cmH20] 8 cmH20 Plateau Pressure:  [24 cmH20-26 cmH20] 25 cmH20   Intake/Output Summary (Last 24 hours) at 07/11/2023 1130 Last data filed at 07/11/2023 1000 Gross per 24 hour  Intake 1727.53 ml  Output 1875 ml  Net -147.47 ml   Filed Weights   07/09/23 0636 07/10/23 0349  Weight: 110.2 kg 111.4 kg   Examination: General:  critically and chronically ill middle aged F  Neuro: Sedated, awakens easily  HEENT: NCAT ETT secure anicteric sclera  CV: L axillary impella 5.5 cap refill < 3 sec  PULM:  Mechanically ventilated, clear  GI: soft ndnt   GU: foley  Extremities:  no obvious joint deformity. Skin: clean dry warm   Resolved problem list   Assessment and Plan   Acute hypoxic respiratory failure Pulm edema Tobacco use OSA on CPAP  P -WUA/SBT hopeful extubation 6/20 -pulm hygiene -noct NIV   Sev MVR w flail leaflet AoC BiV failure, chronic milrinone  Cardiogenic shock CAD NICM  -impella 5.5 placed 6/20 -restart hep gtt 6/20 -Appreciate Adv HF management, CVTS recs, cards insights  -milrinone , NE per  HF  -off lasix  gtt 6/20 -trend coox and cardiac indices   AKI  -cont foley -follow renal indices, UOP   Hx DVT/ PE on coumadin  + s/p IVC filter  -hep gtt to resume 6/20    Best Practice (right click and Reselect all SmartList Selections daily)   Diet/type: NPO; DVT prophylaxis systemic heparin  Pressure ulcer(s): N/A GI prophylaxis: PPI Lines: Central line, Arterial Line, and yes and it is still needed Foley:  Yes, and it is still needed Code Status:  full code Last date of multidisciplinary goals of care discussion [per primary team]  Labs   CBC: Recent Labs  Lab 07/09/23 1456 07/09/23 1507 07/09/23 1819 07/10/23 0346 07/10/23 0844 07/10/23 1425 07/11/23 0525  WBC 5.6  --   --  11.5*  --   --  7.0  NEUTROABS 5.2  --   --   --   --   --   --   HGB 13.5   < > 13.9 11.7* 12.6 12.6 11.0*  HCT 44.6   < > 41.0 37.9 37.0 37.0 35.7*  MCV 86.9  --   --  85.0  --   --  86.0  PLT 218  --   --  154  --   --  129*   < > = values in this interval not displayed.    Basic Metabolic Panel: Recent Labs  Lab 07/09/23 2053 07/10/23 0345 07/10/23 0346 07/10/23 0844 07/10/23 1425 07/10/23 1751 07/11/23 0114 07/11/23 0525  NA 137  --  138 138 139 138 137 138  K 3.6  --  3.6 4.0 3.3* 3.1* 3.6 4.0  CL 100  --  101  --   --  101 101 103  CO2 24  --  25  --   --  26 25 26   GLUCOSE 116*  --  111*  --   --  104* 91 102*  BUN 15  --  13  --   --  16 19 20   CREATININE 1.17*  --  1.14*  --   --  1.27* 1.25* 1.30*  CALCIUM  8.6*  --  8.8*  --   --  8.9 8.9 9.0  MG 1.8 2.1  --   --   --   --  2.0  --    GFR: Estimated Creatinine Clearance: 73.2 mL/min (A) (by C-G formula based on SCr of 1.3 mg/dL (H)). Recent Labs  Lab 07/09/23 1456 07/09/23 1504 07/09/23 1639 07/10/23 0346 07/11/23 0525  WBC 5.6  --   --  11.5* 7.0  LATICACIDVEN  --  0.9 1.3  --   --     Liver Function Tests: Recent Labs  Lab 07/09/23 1456  AST 31  ALT 14  ALKPHOS 86  BILITOT 1.6*  PROT 8.1   ALBUMIN 3.5  No results for input(s): LIPASE, AMYLASE in the last 168 hours. No results for input(s): AMMONIA in the last 168 hours.  ABG    Component Value Date/Time   PHART 7.383 07/10/2023 1425   PCO2ART 46.9 07/10/2023 1425   PO2ART 129 (H) 07/10/2023 1425   HCO3 27.9 07/10/2023 1425   TCO2 29 07/10/2023 1425   O2SAT 74.9 07/11/2023 0525     Coagulation Profile: Recent Labs  Lab 07/09/23 0648 07/09/23 1456  INR 1.3* 1.3*    Cardiac Enzymes: No results for input(s): CKTOTAL, CKMB, CKMBINDEX, TROPONINI in the last 168 hours.  HbA1C: Hgb A1c MFr Bld  Date/Time Value Ref Range Status  05/08/2023 03:09 AM 6.0 (H) 4.8 - 5.6 % Final    Comment:    (NOTE) Pre diabetes:          5.7%-6.4%  Diabetes:              >6.4%  Glycemic control for   <7.0% adults with diabetes   09/16/2022 08:20 AM 5.5 4.8 - 5.6 % Final    Comment:             Prediabetes: 5.7 - 6.4          Diabetes: >6.4          Glycemic control for adults with diabetes: <7.0     CBG: Recent Labs  Lab 07/10/23 1749 07/10/23 1938 07/10/23 2312 07/11/23 0304 07/11/23 0834  GLUCAP 112* 101* 97 95 97    CRITICAL CARE Performed by: Delories Fetter   Total critical care time: 38 minutes  Critical care time was exclusive of separately billable procedures and treating other patients. Critical care was necessary to treat or prevent imminent or life-threatening deterioration.  Critical care was time spent personally by me on the following activities: development of treatment plan with patient and/or surrogate as well as nursing, discussions with consultants, evaluation of patient's response to treatment, examination of patient, obtaining history from patient or surrogate, ordering and performing treatments and interventions, ordering and review of laboratory studies, ordering and review of radiographic studies, pulse oximetry and re-evaluation of patient's condition.  Eston Hence MSN,  AGACNP-BC [facility] Amion for pager 07/11/2023, 11:30 AM

## 2023-07-11 NOTE — Progress Notes (Signed)
 Advanced Heart Failure Rounding Note  Cardiologist: Alwin Baars, DO  Chief Complaint: Cardiogenic Shock Subjective:    6/17: mTEER with sev posterior leaflet restriction resulting in tear. IABP + PAC. PCWP 40, v up to 60  Coox 75 this morning, weaning sedation, patient following commands. Hopeful extubation today.   Impella 5.5 placed yesterday, occasional ectopy, TTE ordred for placement.   Minimal vent settings.   Swan#s: PAP: (40-69)/(23-45) 61/30 CVP:  [2 mmHg-17 mmHg] 4 mmHg CO:  [6 L/min-7.9 L/min] 7.9 L/min CI:  [2.65 L/min/m2-3.46 L/min/m2] 3.46 L/min/m2   Objective:    Weight Range: 111.4 kg Body mass index is 35.24 kg/m.   Vital Signs:   Temp:  [97.3 F (36.3 C)-99.3 F (37.4 C)] 99.1 F (37.3 C) (06/20 0815) Pulse Rate:  [61-210] 104 (06/20 0815) Resp:  [0-27] 24 (06/20 0815) BP: (96-116)/(67-86) 116/79 (06/20 0800) SpO2:  [98 %-100 %] 99 % (06/20 0815) Arterial Line BP: (81-116)/(47-90) 104/69 (06/20 0815) FiO2 (%):  [40 %] 40 % (06/20 0742) Last BM Date :  (PTA)  Weight change: Filed Weights   07/09/23 0636 07/10/23 0349  Weight: 110.2 kg 111.4 kg   Intake/Output:  Intake/Output Summary (Last 24 hours) at 07/11/2023 0905 Last data filed at 07/11/2023 0800 Gross per 24 hour  Intake 1830.07 ml  Output 2085 ml  Net -254.93 ml    Physical Exam   GENERAL: extubated, confused, ill appearing PULM:  bilateral crackles, improving CARDIAC:  JVP: mildly elevated   Tachycardic, soft systolic murmur. ABDOMEN: Soft, non-tender, non-distended. NEUROLOGIC: Confused after sedation, moves all 4's  Lines/Devices:  Left impella, right femoral IABP site well dressed  Telemetry   Sinus 90-100s, occasional PVCs  Labs    CBC Recent Labs    07/09/23 1456 07/09/23 1507 07/10/23 0346 07/10/23 0844 07/10/23 1425 07/11/23 0525  WBC 5.6  --  11.5*  --   --  7.0  NEUTROABS 5.2  --   --   --   --   --   HGB 13.5   < > 11.7*   < > 12.6 11.0*  HCT  44.6   < > 37.9   < > 37.0 35.7*  MCV 86.9  --  85.0  --   --  86.0  PLT 218  --  154  --   --  129*   < > = values in this interval not displayed.   Basic Metabolic Panel Recent Labs    96/04/54 0345 07/10/23 0346 07/11/23 0114 07/11/23 0525  NA  --    < > 137 138  K  --    < > 3.6 4.0  CL  --    < > 101 103  CO2  --    < > 25 26  GLUCOSE  --    < > 91 102*  BUN  --    < > 19 20  CREATININE  --    < > 1.25* 1.30*  CALCIUM   --    < > 8.9 9.0  MG 2.1  --  2.0  --    < > = values in this interval not displayed.   Liver Function Tests Recent Labs    07/09/23 1456  AST 31  ALT 14  ALKPHOS 86  BILITOT 1.6*  PROT 8.1  ALBUMIN 3.5   BNP (last 3 results) Recent Labs    05/28/23 1224 06/30/23 1218 07/04/23 1057  BNP 1,155.3* 600.6* 612.0*   Fasting Lipid Panel Recent  Labs    07/10/23 0346  TRIG 73   Medications:    Scheduled Medications:  Chlorhexidine  Gluconate Cloth  6 each Topical Daily   docusate  100 mg Per Tube BID   insulin  aspart  0-9 Units Subcutaneous Q4H   mouth rinse  15 mL Mouth Rinse Q2H   pantoprazole  (PROTONIX ) IV  40 mg Intravenous Daily   polyethylene glycol  17 g Per Tube BID   senna  2 tablet Per Tube Daily   Infusions:  fentaNYL  infusion INTRAVENOUS 50 mcg/hr (07/11/23 0800)   heparin  500 Units/hr (07/11/23 0905)   milrinone  0.375 mcg/kg/min (07/11/23 0800)   norepinephrine  (LEVOPHED ) Adult infusion 2 mcg/min (07/11/23 0800)   propofol  (DIPRIVAN ) infusion 10 mcg/kg/min (07/11/23 0800)   sodium bicarbonate  25 mEq (Impella PURGE) in dextrose  5 % 1000 mL bag     PRN Medications: albuterol , fentaNYL  (SUBLIMAZE ) injection, mouth rinse  Patient Profile   48 y.o. female with history of chronic systolic HF with biventricular dysfunction on home inotrope, severe MR, hx PE/DVT, obesity, CAD, anemia, tobacco use. Presented for mTEER on 07/09/23. Worsening MR leading to cardiogenic shock.  Assessment/Plan   Cardiogenic shock: due to  torrential MR s/p mTEER complicated by severely restricted posterior leaflet that was damaged during repair attempt, IABP and nipride needed to stabilize. Transitioned to Impella 5.5. Consideration for LVAD workup on Monday. Not a txp candidate with tobacco use.   - Extubated today - Swan numbers with Pa 70/28, significant improved from prior - Continue impella 5.5 at P8 with 4.7L/min of flow - Impella noted to be shallow and within the mitral valve apparatus on echo. Withdrawn and advanced under echo guidance, noted to be 5.8 from the annulus with slightly improved position. Noted to have more VT towards the septum - Reduce milrinone  to 0.37mcg/kg/min - Holding diuretics, CVP 2-4, near dry weight - Start heparin  gtt at 500units/hour  Acute on chronic systolic HF with biventricular dysfunction - stage D cardiomyopathy - nonobstructive CAD on cath 2023.  - RHC 5/25 on milrinone  0.25: severe pulmonary venous HTN, severely elevated PCWP with prominent v waves, preserved CO.  - Reduced milrinone  to 0.25 - needs stabilization and LVAD candidacy determination   Torrential MR - See above   Acute hypoxic respiratory failure - In setting of acute on chronic CHF and severe/torrential MR - Improved with unloading, extubated 6/20   Hx of PE/DVT w/ IVC filter - Submassive saddle PE extending from the distal main pulmonary artery and to the left and right pulmonary arteries with extension into the left upper lobe, left lower lobe and lingula branches and right upper lobe, right middle lobe and right lower lobes on 04/27/2013  - Evaluated at [facility] where thrombophilia work-up was negative (Lupus inhibitor, anticardiolipin antibodies, and anti beta-2  glycoprotein antibodies were negative; protein C activity 85%, protein S activity 76%, factor V Leiden, and prothrombin gene mutation ) - IVC filter in place; could not be removed due to significant thrombus burden on filter at [facility].  - Previously advised against  DOACs due to severity of thrombus burden and weight >250lbs.  - Heparin  gtt for now with Impella and potential procedures   CAD - LHC in 5/23 w/ nonobstructive CAD involving RCA - No aspirin  with need for anticoagulation.  Tobacco use - Smoking 3 cigarettes/day at time of last follow-up.  Length of Stay: 2  CRITICAL CARE Performed by: [name]   Total critical care time: 60 minutes  Critical care time was exclusive  of separately billable procedures and treating other patients.  Critical care was necessary to treat or prevent imminent or life-threatening deterioration.  Critical care was time spent personally by me on the following activities: development of treatment plan with patient and/or surrogate as well as nursing, discussions with consultants, evaluation of patient's response to treatment, examination of patient, obtaining history from patient or surrogate, ordering and performing treatments and interventions, ordering and review of laboratory studies, ordering and review of radiographic studies, pulse oximetry and re-evaluation of patient's condition.   [name], MD  07/11/2023, 9:05 AM  Advanced Heart Failure Team Pager [phone] (M-F; 7a - 5p)  Please contact [facility] for night-coverage after hours (5p -7a ) and weekends on [email or web address]

## 2023-07-11 NOTE — Progress Notes (Addendum)
 Advanced Heart Failure Rounding Note  Cardiologist: Alwin Baars, DO  Chief Complaint: Cardiogenic Shock Subjective:    6/17: mTEER with sev posterior leaflet restriction resulting in tear. IABP + PAC. PCWP 40, v up to 60 6/19: S/P HMIII VAD   On Milrinone  0.375 + Norepi  . CO-OX 75%.   Impella P8  Flow 4.7 liters    Swan#s: PAP: (40-68)/(23-37) 49/29 CVP:  [2 mmHg-17 mmHg] 6 mmHg PCWP:  [29 mmHg] 29 mmHg CO:  [6 L/min-7.9 L/min] 7 L/min CI:  [2.65 L/min/m2-3.49 L/min/m2] 3.07 L/min/m2  Remains intubated. Follows commands.   Objective:    Weight Range: 111.4 kg Body mass index is 35.24 kg/m.   Vital Signs:   Temp:  [97.3 F (36.3 C)-99.3 F (37.4 C)] 99.1 F (37.3 C) (06/20 1607) Pulse Rate:  [61-216] 99 (06/20 0632) Resp:  [0-27] 24 (06/20 0632) BP: (96-108)/(67-86) 104/77 (06/20 0600) SpO2:  [98 %-100 %] 100 % (06/20 3710) Arterial Line BP: (81-253)/(47-248) 97/68 (06/20 0632) FiO2 (%):  [40 %-50 %] 40 % (06/20 0400) Last BM Date :  (PTA)  Weight change: Filed Weights   07/09/23 0636 07/10/23 0349  Weight: 110.2 kg 111.4 kg   Intake/Output:  Intake/Output Summary (Last 24 hours) at 07/11/2023 0644 Last data filed at 07/11/2023 0630 Gross per 24 hour  Intake 2030.16 ml  Output 2185 ml  Net -154.84 ml    Physical Exam  General:   Intubated.  Neck: supple. no JVD.  Cor: PMI nondisplaced. Regular rate & rhythm. No rubs, gallops or murmurs. L axillary Impella 5.5. Lungs: clear Abdomen: soft, nontender, nondistended.  Extremities: no cyanosis, clubbing, rash, edema. R and L pedal pulse. R groing dressing intact.  Neuro:Awake MAE   Telemetry   ST 100s with PVCs and NSVT   EKG    No new EKG to review  Labs    CBC Recent Labs    07/09/23 1456 07/09/23 1507 07/10/23 0346 07/10/23 0844 07/10/23 1425 07/11/23 0525  WBC 5.6  --  11.5*  --   --  7.0  NEUTROABS 5.2  --   --   --   --   --   HGB 13.5   < > 11.7*   < > 12.6 11.0*   HCT 44.6   < > 37.9   < > 37.0 35.7*  MCV 86.9  --  85.0  --   --  86.0  PLT 218  --  154  --   --  129*   < > = values in this interval not displayed.   Basic Metabolic Panel Recent Labs    62/69/48 0345 07/10/23 0346 07/11/23 0114 07/11/23 0525  NA  --    < > 137 138  K  --    < > 3.6 4.0  CL  --    < > 101 103  CO2  --    < > 25 26  GLUCOSE  --    < > 91 102*  BUN  --    < > 19 20  CREATININE  --    < > 1.25* 1.30*  CALCIUM   --    < > 8.9 9.0  MG 2.1  --  2.0  --    < > = values in this interval not displayed.   Liver Function Tests Recent Labs    07/09/23 1456  AST 31  ALT 14  ALKPHOS 86  BILITOT 1.6*  PROT 8.1  ALBUMIN 3.5  BNP (last 3 results) Recent Labs    05/28/23 1224 06/30/23 1218 07/04/23 1057  BNP 1,155.3* 600.6* 612.0*   Fasting Lipid Panel Recent Labs    07/10/23 0346  TRIG 73   Medications:    Scheduled Medications:  Chlorhexidine  Gluconate Cloth  6 each Topical Daily   docusate  100 mg Per Tube BID   insulin  aspart  0-9 Units Subcutaneous Q4H   mouth rinse  15 mL Mouth Rinse Q2H   pantoprazole  (PROTONIX ) IV  40 mg Intravenous Daily   polyethylene glycol  17 g Per Tube BID   senna  2 tablet Per Tube Daily   Infusions:  fentaNYL  infusion INTRAVENOUS 50 mcg/hr (07/11/23 0630)   milrinone  0.375 mcg/kg/min (07/11/23 0630)   nitroPRUSSide Stopped (07/10/23 2247)   norepinephrine  (LEVOPHED ) Adult infusion 3 mcg/min (07/11/23 0630)   propofol  (DIPRIVAN ) infusion 20 mcg/kg/min (07/11/23 0630)   sodium bicarbonate  25 mEq (Impella PURGE) in dextrose  5 % 1000 mL bag     PRN Medications: albuterol , fentaNYL  (SUBLIMAZE ) injection, mouth rinse  Patient Profile   48 y.o. female with history of chronic systolic HF with biventricular dysfunction on home inotrope, severe MR, hx PE/DVT, obesity, CAD, anemia, tobacco use. Presented for mTEER on 07/09/23. Procedure c/b torn mitral valve leaflet resulting in torrential MR and development of  cardiogenic shock.   Assessment/Plan   Cardiogenic shock: due to torrential MR s/p mTEER complicated by severely restricted posterior leaflet that was damaged during repair attempt. Resulting in severe hypotension and hypoxia in cath lab. IABP and PAC placed. Will need to stabilize and eventually decide if will be candidate for LVAD (RV may be prohibitive). Not a candidate for transplant with tobacco use. - In cath lab PCWP in 40s with V waves to 60s. PAC placed.   - vent management per CCM - Impella 5.5 @ P8 Flow 4.7. CO-OX 75%.  - Continue milrinone  0.375 mcg/kg/min + Norepi 2 mcg.CO 7. CI 3.1 -CVP  6 hold diuretics.  - Continue heparin  gtt  Acute on chronic systolic HF with biventricular dysfunction - stage D cardiomyopathy - nonobstructive CAD on cath 2023.  - RHC 4/25 (on 7.5 DBA): Severely elevated biventricular filling pressures, severe post capillary PH, normal CO by TD.  - Echo 4/25 EF 25-30% moderately reduced RV with mod/sev MR  - TEE 5/25: EF 40-45%, RV moderately reduced, severe BAE, severe posterior MR - RHC 5/25 on milrinone  0.25: severe pulmonary venous HTN, severely elevated PCWP with prominent v waves, preserved CO.  - On home Milrinone  0.375. CO-OX stable  - needs stabilization and LVAD candidacy determination. Will put on VAD coordinators radar.   PVC,NSVT -Add amio drip.    Torrential MR - See above   Acute hypoxic respiratory failure - In setting of acute on chronic CHF and severe/torrential MR -Planning extubation today.     Hx of PE/DVT w/ IVC filter - Submassive saddle PE extending from the distal main pulmonary artery and to the left and right pulmonary arteries with extension into the left upper lobe, left lower lobe and lingula branches and right upper lobe, right middle lobe and right lower lobes on 04/27/2013  - Evaluated at [facility] where thrombophilia work-up was negative (Lupus inhibitor, anticardiolipin antibodies, and anti beta-2  glycoprotein antibodies  were negative; protein C activity 85%, protein S activity 76%, factor V Leiden, and prothrombin gene mutation ) - IVC filter in place; could not be removed due to significant thrombus burden on filter at [facility].  - Previously advised  against DOACs due to severity of thrombus burden and weight >250lbs.  - Heparin  gtt for now    CAD - LHC in 5/23 w/ nonobstructive CAD involving RCA - No aspirin  with need for anticoagulation.   Tobacco use - Smoking 3 cigarettes/day at time of last follow-up. Unable to assess currently.  Length of Stay: 2  CRITICAL CARE Performed by: Nieves Bars NP-C   Total critical care time: 15 minutes  -Critical care time was exclusive of separately billable procedures and treating other patients. -Critical care was necessary to treat or prevent imminent or life-threatening deterioration. -Critical care was time spent personally by me on the following activities: development of treatment plan with patient and/or surrogate as well as nursing, discussions with consultants, evaluation of patient's response to treatment, examination of patient, obtaining history from patient or surrogate, ordering and performing treatments and interventions, ordering and review of laboratory studies, ordering and review of radiographic studies, pulse oximetry and re-evaluation of patient's condition.  Nieves Bars, NP  07/11/2023, 6:44 AM  Advanced Heart Failure Team Pager [phone] (M-F; 7a - 5p)  Please contact [facility] for night-coverage after hours (5p -7a ) and weekends on [email or web address]

## 2023-07-11 NOTE — Anesthesia Postprocedure Evaluation (Signed)
 Anesthesia Post Note  Patient: [name] [name]  Procedure(s) Performed: INSERTION, CARDIAC ASSIST DEVICE, IMPELLA 5.5 (Left: Axilla) ECHOCARDIOGRAM, TRANSESOPHAGEAL, INTRAOPERATIVE     Patient location during evaluation: SICU Anesthesia Type: General Level of consciousness: sedated Pain management: pain level controlled Vital Signs Assessment: post-procedure vital signs reviewed and stable Respiratory status: patient remains intubated per anesthesia plan Cardiovascular status: stable Postop Assessment: no apparent nausea or vomiting Anesthetic complications: no   No notable events documented.  Last Vitals:  Vitals:   07/11/23 2003 07/11/23 2100  BP:    Pulse: 83 83  Resp: (!) 28 (!) 27  Temp: 36.8 C 36.6 C  SpO2: 93% 92%    Last Pain:  Vitals:   07/11/23 2000  TempSrc: Core  PainSc:                  Melvenia Stabs

## 2023-07-11 NOTE — Progress Notes (Incomplete)
 PHARMACY - ANTICOAGULATION CONSULT NOTE  Pharmacy Consult for heparin  Indication: IABP>impella 5.5  Allergies  Allergen Reactions   Sulfa  Antibiotics Itching, Swelling and Rash    Lip swelling    Patient Measurements: Height: 5' 10 (177.8 cm) Weight: 111.4 kg (245 lb 9.5 oz) IBW/kg (Calculated) : 68.5 HEPARIN  DW (KG): 93  Vital Signs: Temp: 98.5 F (36.9 C) (06/20 1549) Temp Source: Oral (06/20 1549) BP: 111/78 (06/20 1400) Pulse Rate: 89 (06/20 1545)  Labs: Recent Labs    07/09/23 0648 07/09/23 1114 07/09/23 1456 07/09/23 1507 07/09/23 2306 07/10/23 0346 07/10/23 0605 07/10/23 0844 07/10/23 1425 07/10/23 1751 07/11/23 0114 07/11/23 0525 07/11/23 1129  HGB  --    < > 13.5   < >  --  11.7*  --  12.6 12.6  --   --  11.0*  --   HCT  --    < > 44.6   < >  --  37.9  --  37.0 37.0  --   --  35.7*  --   PLT  --   --  218  --   --  154  --   --   --   --   --  129*  --   LABPROT 16.1*  --  16.2*  --   --   --   --   --   --   --   --   --   --   INR 1.3*  --  1.3*  --   --   --   --   --   --   --   --   --   --   HEPARINUNFRC  --   --   --   --  <0.10*  --  <0.10*  --   --   --   --   --   --   CREATININE  --   --  1.20*   < >  --  1.14*  --   --   --    < > 1.25* 1.30* 1.29*   < > = values in this interval not displayed.    Estimated Creatinine Clearance: 73.7 mL/min (A) (by C-G formula based on SCr of 1.29 mg/dL (H)).   Medical History: Past Medical History:  Diagnosis Date   Anemia    Blood transfusion without reported diagnosis    CAD (coronary artery disease)    Nonobstructive   Closed left ankle fracture    August 30 2012   Clotting disorder [facility])    DVT (deep venous thrombosis) (HCC)    L leg   Fibroid tumor    HFrEF (heart failure with reduced ejection fraction) (HCC)    Hidradenitis    Hypertension    Mitral regurgitation    Myocardial infarction [facility])    NICM (nonischemic cardiomyopathy) (HCC)    NSVT (nonsustained ventricular tachycardia)  (HCC)    Obesity    OSA on CPAP    Pulmonary embolism (HCC) 04/2013   Sleep apnea     Medications:  Scheduled:   Chlorhexidine  Gluconate Cloth  6 each Topical Daily   [START ON 07/12/2023] feeding supplement  237 mL Oral BID BM   insulin  aspart  0-9 Units Subcutaneous Q4H   [START ON 07/12/2023] multivitamin with minerals  1 tablet Oral Daily   pantoprazole  (PROTONIX ) IV  40 mg Intravenous Daily   polyethylene glycol  17 g Oral BID   potassium chloride   60 mEq Oral  Once   senna-docusate  2 tablet Per Tube BID    Assessment: 38 yof who presented for mTEER complicated with torn mitral valve leaflet resulting in torrential MR and cardiogenic shock requiring IABP. On warfarin PTA for hx PE (LD 6/12) - was on enoxaparin  while warfarin on hold for procedure (LD 6/17).  Heparin  infusion for IABP restarted 2 hours after procedure 6/18  IABP out. Heparin  discontinued just prior to Impella placement. Impella placed 6/19 and decision was made to continue to hold heparin  by Dr. Sherene Dilling until after evaluation by cardiology in the morning (6/20). After evaluation at bedside this morning, ok to start low dose, fixed dose heparin  infusion at 500 units/hour for first 24 hours. Hgb 11, plt 129 downtrending but patient with no signs of bleeding, stable. Will continue to monitor for bleeding and need to increase rate.  6/20 PM - heparin  level undetectable as expected.  No infusion issues or s/sx bleeding per RN.  Goal of Therapy:  Heparin  level 0.3-0.5 units/ml Monitor platelets by anticoagulation protocol: Yes   Plan:  Heparin  IV 500 units/hr - no titration for first 24 hours without MD order Daily heparin  level and CBC Monitor for signs/symptoms of bleeding  Cecillia Cogan, PharmD Clinical Pharmacist 07/11/2023  4:12 PM

## 2023-07-11 NOTE — Progress Notes (Signed)
 PHARMACY - ANTICOAGULATION CONSULT NOTE  Pharmacy Consult for heparin  Indication: IABP>impella 5/5  Allergies  Allergen Reactions   Sulfa  Antibiotics Itching, Swelling and Rash    Lip swelling    Patient Measurements: Height: 5' 10 (177.8 cm) Weight: 111.4 kg (245 lb 9.5 oz) IBW/kg (Calculated) : 68.5 HEPARIN  DW (KG): 93  Vital Signs: Temp: 99.1 F (37.3 C) (06/20 0930) Temp Source: Core (06/20 0400) BP: 116/79 (06/20 0800) Pulse Rate: 110 (06/20 0930)  Labs: Recent Labs    07/09/23 0648 07/09/23 1114 07/09/23 1456 07/09/23 1507 07/09/23 2306 07/10/23 0346 07/10/23 0605 07/10/23 0844 07/10/23 1425 07/10/23 1751 07/11/23 0114 07/11/23 0525  HGB  --    < > 13.5   < >  --  11.7*  --  12.6 12.6  --   --  11.0*  HCT  --    < > 44.6   < >  --  37.9  --  37.0 37.0  --   --  35.7*  PLT  --   --  218  --   --  154  --   --   --   --   --  129*  LABPROT 16.1*  --  16.2*  --   --   --   --   --   --   --   --   --   INR 1.3*  --  1.3*  --   --   --   --   --   --   --   --   --   HEPARINUNFRC  --   --   --   --  <0.10*  --  <0.10*  --   --   --   --   --   CREATININE  --   --  1.20*   < >  --  1.14*  --   --   --  1.27* 1.25* 1.30*   < > = values in this interval not displayed.    Estimated Creatinine Clearance: 73.2 mL/min (A) (by C-G formula based on SCr of 1.3 mg/dL (H)).   Medical History: Past Medical History:  Diagnosis Date   Anemia    Blood transfusion without reported diagnosis    CAD (coronary artery disease)    Nonobstructive   Closed left ankle fracture    August 30 2012   Clotting disorder (HCC)    DVT (deep venous thrombosis) (HCC)    L leg   Fibroid tumor    HFrEF (heart failure with reduced ejection fraction) (HCC)    Hidradenitis    Hypertension    Mitral regurgitation    Myocardial infarction (HCC)    NICM (nonischemic cardiomyopathy) (HCC)    NSVT (nonsustained ventricular tachycardia) (HCC)    Obesity    OSA on CPAP    Pulmonary  embolism (HCC) 04/2013   Sleep apnea     Medications:  Scheduled:   Chlorhexidine  Gluconate Cloth  6 each Topical Daily   docusate  100 mg Per Tube BID   insulin  aspart  0-9 Units Subcutaneous Q4H   mouth rinse  15 mL Mouth Rinse Q2H   pantoprazole  (PROTONIX ) IV  40 mg Intravenous Daily   polyethylene glycol  17 g Per Tube BID   senna  2 tablet Per Tube Daily    Assessment: 51 yof who presented for mTEER complicated with torn mitral valve leaflet resulting in torrential MR and cardiogenic shock requiring IABP. On warfarin PTA for  hx PE (LD 6/12) - was on enoxaparin  while warfarin on hold for procedure (LD 6/17).  Heparin  infusion for IABP restarted 2 hours after procedure 6/18  IABP out. Heparin  discontinued just prior to Impella placement. Impella placed 6/19 and decision was made to continue to hold heparin  by Dr. Sherene Dilling until after evaluation by cardiology in the morning (6/20). After evaluation at bedside this morning, ok to start low dose, fixed dose heparin  infusion at 500 units/hour for first 24 hours. Hgb 11, plt 129 downtrending but patient with no signs of bleeding, stable. Will continue to monitor for bleeding and need to increase rate.   Goal of Therapy:  Heparin  level 0.3-0.5 units/ml Monitor platelets by anticoagulation protocol: Yes   Plan:  Start heparin  infusion at 500 units/hour - no titration for first 24 hours without MD order Heparin  level in 6 hours Daily heparin  level and CBC Monitor for signs/symptoms of bleeding   Jerrel Mor, PharmD PGY1 Pharmacy Resident 07/11/2023 9:50 AM

## 2023-07-11 NOTE — Progress Notes (Signed)
 PHARMACY - ANTICOAGULATION CONSULT NOTE  Pharmacy Consult for heparin  Indication: IABP>impella 5.5  Allergies  Allergen Reactions   Sulfa  Antibiotics Itching, Swelling and Rash    Lip swelling    Patient Measurements: Height: 5' 10 (177.8 cm) Weight: 111.4 kg (245 lb 9.5 oz) IBW/kg (Calculated) : 68.5 HEPARIN  DW (KG): 93  Vital Signs: Temp: 98.5 F (36.9 C) (06/20 1549) Temp Source: Oral (06/20 1549) BP: 111/78 (06/20 1400) Pulse Rate: 88 (06/20 1600)  Labs: Recent Labs    07/09/23 0648 07/09/23 1114 07/09/23 1456 07/09/23 1507 07/09/23 2306 07/10/23 0346 07/10/23 0605 07/10/23 0844 07/10/23 1425 07/10/23 1751 07/11/23 0114 07/11/23 0525 07/11/23 1129 07/11/23 1500  HGB  --    < > 13.5   < >  --  11.7*  --  12.6 12.6  --   --  11.0*  --   --   HCT  --    < > 44.6   < >  --  37.9  --  37.0 37.0  --   --  35.7*  --   --   PLT  --   --  218  --   --  154  --   --   --   --   --  129*  --   --   LABPROT 16.1*  --  16.2*  --   --   --   --   --   --   --   --   --   --   --   INR 1.3*  --  1.3*  --   --   --   --   --   --   --   --   --   --   --   HEPARINUNFRC  --   --   --   --  <0.10*  --  <0.10*  --   --   --   --   --   --  <0.10*  CREATININE  --   --  1.20*   < >  --  1.14*  --   --   --    < > 1.25* 1.30* 1.29*  --    < > = values in this interval not displayed.    Estimated Creatinine Clearance: 73.7 mL/min (A) (by C-G formula based on SCr of 1.29 mg/dL (H)).   Medical History: Past Medical History:  Diagnosis Date   Anemia    Blood transfusion without reported diagnosis    CAD (coronary artery disease)    Nonobstructive   Closed left ankle fracture    August 30 2012   Clotting disorder [facility])    DVT (deep venous thrombosis) (HCC)    L leg   Fibroid tumor    HFrEF (heart failure with reduced ejection fraction) (HCC)    Hidradenitis    Hypertension    Mitral regurgitation    Myocardial infarction [facility])    NICM (nonischemic cardiomyopathy)  (HCC)    NSVT (nonsustained ventricular tachycardia) (HCC)    Obesity    OSA on CPAP    Pulmonary embolism (HCC) 04/2013   Sleep apnea     Medications:  Scheduled:   Chlorhexidine  Gluconate Cloth  6 each Topical Daily   [START ON 07/12/2023] feeding supplement  237 mL Oral BID BM   insulin  aspart  0-9 Units Subcutaneous Q4H   [START ON 07/12/2023] multivitamin with minerals  1 tablet Oral Daily   pantoprazole  (PROTONIX ) IV  40 mg Intravenous Daily   polyethylene glycol  17 g Oral BID   potassium chloride   60 mEq Oral Once   senna-docusate  2 tablet Per Tube BID    Assessment: 82 yof who presented for mTEER complicated with torn mitral valve leaflet resulting in torrential MR and cardiogenic shock requiring IABP. On warfarin PTA for hx PE (LD 6/12) - was on enoxaparin  while warfarin on hold for procedure (LD 6/17).  Heparin  infusion for IABP restarted 2 hours after procedure 6/18  IABP out. Heparin  discontinued just prior to Impella placement. Impella placed 6/19 and decision was made to continue to hold heparin  by Dr. Sherene Dilling until after evaluation by cardiology in the morning (6/20). After evaluation at bedside this morning, ok to start low dose, fixed dose heparin  infusion at 500 units/hour for first 24 hours. Hgb 11, plt 129 downtrending but patient with no signs of bleeding, stable. Will continue to monitor for bleeding and need to increase rate.  6/20 PM - heparin  level undetectable as expected.  No infusion issues or s/sx bleeding per RN.  Goal of Therapy:  Heparin  level 0.3-0.5 units/ml Monitor platelets by anticoagulation protocol: Yes   Plan:  Heparin  IV 500 units/hr - no titration for first 24 hours without MD order Daily heparin  level and CBC Monitor for signs/symptoms of bleeding  Enrigue Harvard, PharmD, BCPS Please see amion for complete clinical pharmacist phone list 07/11/2023  4:29 PM

## 2023-07-11 NOTE — Progress Notes (Signed)
 1 Day Post-Op Procedure(s) (LRB): INSERTION, CARDIAC ASSIST DEVICE, IMPELLA 5.5 (Left) ECHOCARDIOGRAM, TRANSESOPHAGEAL, INTRAOPERATIVE (N/A) Subjective:  Extubated today.  Objective: Vital signs in last 24 hours: Temp:  [97.3 F (36.3 C)-99.3 F (37.4 C)] 99 F (37.2 C) (06/20 1445) Pulse Rate:  [61-125] 90 (06/20 1445) Cardiac Rhythm: Sinus tachycardia;Normal sinus rhythm (06/20 1200) Resp:  [15-33] 29 (06/20 1445) BP: (96-123)/(67-95) 111/78 (06/20 1400) SpO2:  [92 %-100 %] 93 % (06/20 1445) Arterial Line BP: (81-118)/(59-90) 107/75 (06/20 1445) FiO2 (%):  [40 %] 40 % (06/20 1029)  Hemodynamic parameters for last 24 hours: PAP: (40-87)/(25-45) 87/35 CVP:  [0 mmHg-17 mmHg] 7 mmHg CO:  [6 L/min-7.9 L/min] 7.9 L/min CI:  [2.65 L/min/m2-3.46 L/min/m2] 3.46 L/min/m2  Intake/Output from previous day: 06/19 0701 - 06/20 0700 In: 1982.1 [I.V.:1830.2; IV Piggyback:54.9] Out: 2155 [Urine:2095; Drains:60] Intake/Output this shift: Total I/O In: 364.1 [I.V.:318.4; Other:45.7] Out: 220 [Urine:210; Drains:10]  General appearance: alert and cooperative Neurologic: intact Heart: regular rate and rhythm, systolic murmur Lungs: rales bilaterally Extremities: edema mild Wound: Impella site ok.   Lab Results: Recent Labs    07/10/23 0346 07/10/23 0844 07/10/23 1425 07/11/23 0525  WBC 11.5*  --   --  7.0  HGB 11.7*   < > 12.6 11.0*  HCT 37.9   < > 37.0 35.7*  PLT 154  --   --  129*   < > = values in this interval not displayed.   BMET:  Recent Labs    07/11/23 0525 07/11/23 1129  NA 138 139  K 4.0 3.5  CL 103 102  CO2 26 24  GLUCOSE 102* 114*  BUN 20 20  CREATININE 1.30* 1.29*  CALCIUM  9.0 9.1    PT/INR:  Recent Labs    07/09/23 1456  LABPROT 16.2*  INR 1.3*   ABG    Component Value Date/Time   PHART 7.383 07/10/2023 1425   HCO3 27.9 07/10/2023 1425   TCO2 29 07/10/2023 1425   O2SAT 74.9 07/11/2023 0525   CBG (last 3)  Recent Labs    07/11/23 0304  07/11/23 0834 07/11/23 1246  GLUCAP 95 97 114*    Assessment/Plan: S/P Procedure(s) (LRB): INSERTION, CARDIAC ASSIST DEVICE, IMPELLA 5.5 (Left) ECHOCARDIOGRAM, TRANSESOPHAGEAL, INTRAOPERATIVE (N/A)  Hemodynamically stable on milrinone  0.25. CI 3.0, Co-ox 75 this am. Impella flow 4.5-4.6 on P8.  Keep JP today.   Ok to start heparin .   Plans per AHF team.   LOS: 2 days    Bartley Lightning 07/11/2023

## 2023-07-11 NOTE — Procedures (Signed)
 Extubation Procedure Note  Patient Details:   Name: [name] [name] DOB: October 31, 1975 MRN: [id number]   Airway Documentation:    Vent end date: 07/11/23 Vent end time: 1041   Evaluation  O2 sats: stable throughout Complications: No apparent complications Patient did tolerate procedure well. Bilateral Breath Sounds: Diminished   Yes Pt extubated to 6L [place]. Positive cuff leak noted  [name] 07/11/2023, 11:01 AM

## 2023-07-11 NOTE — Progress Notes (Signed)
 Structural Heart Follow-up Note: Patient's status post Impella 5.5 placement yesterday.  Remains intubated.  Discussed case with CCM team.  Patient is awake and hemodynamically stable.  Appreciate care of advanced heart failure, cardiac surgery, and CCM. Hopefully will extubate soon. On FiO2 0.4.   Akaylah Lalley 07/11/2023 9:06 AM

## 2023-07-11 NOTE — TOC Initial Note (Signed)
 Transition of Care [facility]) - Initial/Assessment Note    Patient Details  Name: [name] [name] MRN: [id number] Date of Birth: 1975-05-14  Transition [facility] Care [facility]) CM/SW Contact:    Benjiman Bras, RN Phone Number: [phone] 07/11/2023, 3:37 PM  Clinical Narrative:                 TOC CM spoke to pt and she was lethargic. Gave permission to speak to mother. Pt was active with [facility] for [facility], and [facility]. Notified rep, Pam RN with [facility].   Expected Discharge Plan: IP Rehab Facility Barriers to Discharge: Continued Medical Work up   Patient Goals and CMS Choice Patient states their goals for this hospitalization and ongoing recovery are:: just want to feel better CMS Medicare.gov Compare Post Acute Care list provided to:: Patient Choice offered to / list presented to : Patient      Expected Discharge Plan and Services   Discharge Planning Services: CM Consult Post Acute Care Choice: Home Health                               [facility] Agency: [facility] Date [facility] Agency Contacted: 07/11/23 Time HH Agency Contacted: [phone] Representative spoke with at [facility] Agency: Pam RN  Prior Living Arrangements/Services     Patient language and need for interpreter reviewed:: Yes        Need for Family Participation in Patient Care: Yes (Comment) Care giver support system in place?: Yes (comment)   Criminal Activity/Legal Involvement Pertinent to Current Situation/Hospitalization: No - Comment as needed  Activities of Daily Living   ADL Screening (condition at time of admission) Independently performs ADLs?: Yes (appropriate for developmental age) Is the patient deaf or have difficulty hearing?: No Does the patient have difficulty seeing, even when wearing glasses/contacts?: No Does the patient have difficulty concentrating, remembering, or making decisions?: No  Permission Sought/Granted Permission sought to share information with : Case  Manager, Family Supports, PCP Permission granted to share information with : Yes, Verbal Permission Granted  Share Information with NAME: Nanda Baar  Permission granted to share info w AGENCY: Home Health, DME, PCP  Permission granted to share info w Relationship: mother  Permission granted to share info w Contact Information: [phone]  Emotional Assessment Appearance:: Appears stated age Attitude/Demeanor/Rapport: Engaged Affect (typically observed): Accepting Orientation: : Oriented to Self, Oriented to Place, Oriented to  Time, Oriented to Situation   Psych Involvement: No (comment)  Admission diagnosis:  Severe mitral insufficiency [I34.0] Heart failure (HCC) [I50.9] Patient Active Problem List   Diagnosis Date Noted   Severe mitral regurgitation 07/09/2023   Heart failure (HCC) 07/09/2023   Prediabetes 05/26/2023   Cardiogenic shock (HCC) 05/07/2023   Acute on chronic combined systolic (congestive) and diastolic (congestive) heart failure (HCC) 05/06/2023   HFrEF (heart failure with reduced ejection fraction) (HCC) 07/08/2022   Closed cervical spine fracture/C2 09/22/2021   Obesity (BMI 30-39.9) 09/22/2021   Essential hypertension 06/05/2021   Lumbar degenerative disc disease 12/14/2018   Chronic anticoagulation 03/24/2018   S/P IVC filter 12/01/2017   Chronic bilateral low back pain with bilateral sciatica 12/01/2017   Chronic pain syndrome 12/01/2017   Uterine leiomyoma 08/18/2014   Severe Iron  deficiency anemia 02/05/2014   Secondary pulmonary hypertension 07/06/2013   OSA on CPAP 07/06/2013   Tobacco use disorder 04/27/2013   PCP:  Grooms, [place], PA-C Pharmacy:   [place]  [address] - [place], [place] - [address] [place] [address] Phone: [phone] Fax: [phone]  [facility] [address] [place] [address] Phone: [phone] Fax: [phone]     Social Drivers of Health  (SDOH) Social History: SDOH Screenings   Food Insecurity: No Food Insecurity (05/20/2023)  Housing: Low Risk  (05/20/2023)  Transportation Needs: No Transportation Needs (05/20/2023)  Utilities: Not At Risk (05/20/2023)  Alcohol  Screen: Low Risk  (04/03/2023)  Depression (PHQ2-9): Low Risk  (05/26/2023)  Financial Resource Strain: Low Risk  (04/03/2023)  Physical Activity: Insufficiently Active (04/03/2023)  Social Connections: Socially Isolated (04/03/2023)  Stress: No Stress Concern Present (04/03/2023)  Tobacco Use: High Risk (07/10/2023)   SDOH Interventions:     Readmission Risk Interventions    07/03/2022   12:12 PM 02/13/2022   12:41 PM 12/31/2021    1:52 PM  Readmission Risk Prevention Plan  Transportation Screening Complete Complete   HRI or Home Care Consult Complete Complete Complete  Social Work Consult for Recovery Care Planning/Counseling Complete Complete Complete  Palliative Care Screening Not Applicable Not Applicable Not Applicable  Medication Review Oceanographer) Complete Complete Complete

## 2023-07-12 DIAGNOSIS — J810 Acute pulmonary edema: Secondary | ICD-10-CM | POA: Diagnosis not present

## 2023-07-12 DIAGNOSIS — G4733 Obstructive sleep apnea (adult) (pediatric): Secondary | ICD-10-CM | POA: Diagnosis not present

## 2023-07-12 DIAGNOSIS — J9601 Acute respiratory failure with hypoxia: Secondary | ICD-10-CM | POA: Diagnosis not present

## 2023-07-12 DIAGNOSIS — I340 Nonrheumatic mitral (valve) insufficiency: Secondary | ICD-10-CM | POA: Diagnosis not present

## 2023-07-12 DIAGNOSIS — R570 Cardiogenic shock: Secondary | ICD-10-CM | POA: Diagnosis not present

## 2023-07-12 DIAGNOSIS — I5043 Acute on chronic combined systolic (congestive) and diastolic (congestive) heart failure: Secondary | ICD-10-CM | POA: Diagnosis not present

## 2023-07-12 LAB — BASIC METABOLIC PANEL WITH GFR
Anion gap: 7 (ref 5–15)
Anion gap: 11 (ref 5–15)
BUN: 17 mg/dL (ref 6–20)
BUN: 16 mg/dL (ref 6–20)
CO2: 27 mmol/L (ref 22–32)
CO2: 25 mmol/L (ref 22–32)
Calcium: 8.9 mg/dL (ref 8.9–10.3)
Calcium: 8.6 mg/dL — ABNORMAL LOW (ref 8.9–10.3)
Chloride: 100 mmol/L (ref 98–111)
Chloride: 100 mmol/L (ref 98–111)
Creatinine, Ser: 0.94 mg/dL (ref 0.44–1.00)
Creatinine, Ser: 0.89 mg/dL (ref 0.44–1.00)
GFR, Estimated: >60 mL/min (ref >60)
GFR, Estimated: >60 mL/min (ref >60)
Glucose, Bld: 87 mg/dL (ref 70–99)
Glucose, Bld: 128 mg/dL — ABNORMAL HIGH (ref 70–99)
Potassium: 3.2 mmol/L — ABNORMAL LOW (ref 3.5–5.1)
Potassium: 4.3 mmol/L (ref 3.5–5.1)
Sodium: 134 mmol/L — ABNORMAL LOW (ref 135–145)
Sodium: 136 mmol/L (ref 135–145)

## 2023-07-12 LAB — CBC
HCT: 35.7 % — ABNORMAL LOW (ref 36.0–46.0)
Hemoglobin: 10.7 g/dL — ABNORMAL LOW (ref 12.0–15.0)
MCH: 26.4 pg (ref 26.0–34.0)
MCHC: 30 g/dL (ref 30.0–36.0)
MCV: 87.9 fL (ref 80.0–100.0)
Platelets: 94 10*3/uL — ABNORMAL LOW (ref 150–400)
RBC: 4.06 MIL/uL (ref 3.87–5.11)
RDW: 22 % — ABNORMAL HIGH (ref 11.5–15.5)
WBC: 5.8 10*3/uL (ref 4.0–10.5)
nRBC: 0 % (ref 0.0–0.2)

## 2023-07-12 LAB — CG4 I-STAT (LACTIC ACID): Lactic Acid, Venous: 0.6 mmol/L (ref 0.5–1.9)

## 2023-07-12 LAB — COOXEMETRY PANEL
Carboxyhemoglobin: 2.2 % — ABNORMAL HIGH (ref 0.5–1.5)
Methemoglobin: <0.7 % (ref 0.0–1.5)
O2 Saturation: 74.3 %
Total hemoglobin: 11 g/dL — ABNORMAL LOW (ref 12.0–16.0)

## 2023-07-12 LAB — HEPARIN LEVEL (UNFRACTIONATED)
Heparin Unfractionated: <0.1 [IU]/mL — ABNORMAL LOW (ref 0.30–0.70)
Heparin Unfractionated: <0.1 [IU]/mL — ABNORMAL LOW (ref 0.30–0.70)
Heparin Unfractionated: <0.1 [IU]/mL — ABNORMAL LOW (ref 0.30–0.70)

## 2023-07-12 LAB — GLUCOSE, CAPILLARY
Glucose-Capillary: 95 mg/dL (ref 70–99)
Glucose-Capillary: 93 mg/dL (ref 70–99)
Glucose-Capillary: 112 mg/dL — ABNORMAL HIGH (ref 70–99)
Glucose-Capillary: 106 mg/dL — ABNORMAL HIGH (ref 70–99)
Glucose-Capillary: 152 mg/dL — ABNORMAL HIGH (ref 70–99)

## 2023-07-12 LAB — MAGNESIUM: Magnesium: 1.9 mg/dL (ref 1.7–2.4)

## 2023-07-12 LAB — LACTATE DEHYDROGENASE: LDH: 287 U/L — ABNORMAL HIGH (ref 98–192)

## 2023-07-12 MED ORDER — SPIRONOLACTONE 25 MG PO TABS
25.0000 mg | ORAL_TABLET | Freq: Every day | ORAL | Status: DC
  Administered 2023-07-12 – 2023-07-17 (×6): 25 mg via ORAL
  Filled 2023-07-12 (×6): qty 1

## 2023-07-12 MED ORDER — OXYCODONE HCL 5 MG PO TABS
10.0000 mg | ORAL_TABLET | ORAL | Status: DC | PRN
  Administered 2023-07-12 – 2023-07-23 (×26): 10 mg via ORAL
  Filled 2023-07-12 (×26): qty 2

## 2023-07-12 MED ORDER — ACETAMINOPHEN 500 MG PO TABS
500.0000 mg | ORAL_TABLET | Freq: Four times a day (QID) | ORAL | Status: DC
  Administered 2023-07-12 – 2023-07-24 (×47): 500 mg via ORAL
  Filled 2023-07-12 (×41): qty 1

## 2023-07-12 MED ORDER — ALBUMIN HUMAN 5 % IV SOLN
INTRAVENOUS | Status: AC
  Filled 2023-07-12: qty 250

## 2023-07-12 MED ORDER — ONDANSETRON HCL 4 MG/2ML IJ SOLN
4.0000 mg | Freq: Four times a day (QID) | INTRAMUSCULAR | Status: DC | PRN
  Administered 2023-07-12 – 2023-07-22 (×3): 4 mg via INTRAVENOUS
  Filled 2023-07-12 (×3): qty 2

## 2023-07-12 MED ORDER — FUROSEMIDE 10 MG/ML IJ SOLN
120.0000 mg | Freq: Two times a day (BID) | INTRAVENOUS | Status: AC
  Administered 2023-07-12 (×2): 120 mg via INTRAVENOUS
  Filled 2023-07-12 (×2): qty 10

## 2023-07-12 MED ORDER — ALPRAZOLAM 0.5 MG PO TABS
0.5000 mg | ORAL_TABLET | Freq: Three times a day (TID) | ORAL | Status: DC | PRN
  Administered 2023-07-12 – 2023-07-22 (×2): 0.5 mg via ORAL
  Filled 2023-07-12 (×2): qty 1

## 2023-07-12 MED ORDER — POTASSIUM CHLORIDE CRYS ER 20 MEQ PO TBCR
60.0000 meq | EXTENDED_RELEASE_TABLET | Freq: Once | ORAL | Status: AC
  Administered 2023-07-12: 60 meq via ORAL
  Filled 2023-07-12: qty 3

## 2023-07-12 MED ORDER — POTASSIUM CHLORIDE CRYS ER 20 MEQ PO TBCR
40.0000 meq | EXTENDED_RELEASE_TABLET | Freq: Once | ORAL | Status: AC
  Administered 2023-07-12: 40 meq via ORAL
  Filled 2023-07-12: qty 2

## 2023-07-12 MED ORDER — SORBITOL 70 % SOLN
30.0000 mL | Freq: Once | Status: AC
  Administered 2023-07-12: 30 mL via ORAL
  Filled 2023-07-12: qty 30

## 2023-07-12 MED ORDER — HYDROMORPHONE HCL 1 MG/ML IJ SOLN
0.5000 mg | INTRAMUSCULAR | Status: DC | PRN
  Administered 2023-07-12 (×2): 1 mg via INTRAVENOUS
  Administered 2023-07-12: 0.5 mg via INTRAVENOUS
  Administered 2023-07-13 (×3): 1 mg via INTRAVENOUS
  Administered 2023-07-14: 0.5 mg via INTRAVENOUS
  Administered 2023-07-14 – 2023-07-16 (×5): 1 mg via INTRAVENOUS
  Filled 2023-07-12 (×12): qty 1

## 2023-07-12 MED ORDER — POTASSIUM CHLORIDE CRYS ER 20 MEQ PO TBCR
40.0000 meq | EXTENDED_RELEASE_TABLET | ORAL | Status: DC
  Administered 2023-07-12: 40 meq via ORAL
  Filled 2023-07-12: qty 2

## 2023-07-12 MED ORDER — MAGNESIUM SULFATE 2 GM/50ML IV SOLN
2.0000 g | Freq: Once | INTRAVENOUS | Status: AC
  Administered 2023-07-12: 2 g via INTRAVENOUS
  Filled 2023-07-12: qty 50

## 2023-07-12 NOTE — Progress Notes (Signed)
 PHARMACY - ANTICOAGULATION CONSULT NOTE  Pharmacy Consult for heparin  Indication: IABP>Impella  Labs: Recent Labs    07/10/23 0346 07/10/23 0605 07/10/23 1425 07/10/23 1751 07/11/23 0525 07/11/23 1129 07/11/23 1500 07/12/23 0447 07/12/23 0843 07/12/23 1421 07/12/23 1448 07/12/23 2254  HGB 11.7*   < > 12.6  --  11.0*  --   --  10.7*  --   --   --   --   HCT 37.9   < > 37.0  --  35.7*  --   --  35.7*  --   --   --   --   PLT 154  --   --   --  129*  --   --  94*  --   --   --   --   HEPARINUNFRC  --    < >  --   --   --   --    < >  --  <0.10*  --  <0.10* <0.10*  CREATININE 1.14*  --   --    < > 1.30* 1.29*  --  0.94  --  0.89  --   --    < > = values in this interval not displayed.   Assessment: 48yo female remains subtherapeutic on heparin  as expected with slow titration; no infusion issues or signs of bleeding per RN.  Goal of Therapy:  Heparin  level 0.3-0.5 units/ml   Plan:  Increase heparin  infusion slightly to 800 units/hr. Check level with am labs.   Marvetta Dauphin, PharmD, BCPS 07/12/2023 11:23 PM

## 2023-07-12 NOTE — Progress Notes (Signed)
 Advanced Heart Failure Rounding Note  Cardiologist: Ria Commander, DO  Chief Complaint: Cardiogenic Shock Subjective:    6/17: mTEER with sev posterior leaflet restriction resulting in tear. IABP + PAC. PCWP 40, v up to 60  Coox stable at 74. Extubated. Unfortunately being rude to the nursing staff, other physicians, asking to be transferred off the floor. Reeducated.  Impella 5.5 turned down from P8 to P4 briefly. Pad rose rapidly from 35 to 50. Turned back to P8 with 4.8L of flow, good waveform.   Swan#s: PAP: (77-108)/(27-47) 94/37 CVP:  [3 mmHg-21 mmHg] 10 mmHg PCWP:  [38 mmHg] 38 mmHg CO:  [4 L/min-5.9 L/min] 4 L/min CI:  [1.8 L/min/m2-2.58 L/min/m2] 1.8 L/min/m2   Objective:    Weight Range: 111.4 kg Body mass index is 35.24 kg/m.   Vital Signs:   Temp:  [96.1 F (35.6 C)-99.3 F (37.4 C)] 97.7 F (36.5 C) (06/21 1300) Pulse Rate:  [58-149] 60 (06/21 1300) Resp:  [14-36] 17 (06/21 1300) BP: (111)/(78) 111/78 (06/20 1400) SpO2:  [89 %-100 %] 95 % (06/21 1300) Arterial Line BP: (81-126)/(66-96) 99/78 (06/21 1300) Last BM Date :  (PTA)  Weight change: Filed Weights   07/09/23 0636 07/10/23 0349  Weight: 110.2 kg 111.4 kg   Intake/Output:  Intake/Output Summary (Last 24 hours) at 07/12/2023 1321 Last data filed at 07/12/2023 1300 Gross per 24 hour  Intake 1305.78 ml  Output 1735 ml  Net -429.22 ml    Physical Exam   GENERAL: extubated, soft voice PULM:  Mildly increased work of breath CARDIAC:  JVP: flat   Tachycardic, systolic murmur but soft ABDOMEN: Soft, non-tender, non-distended. NEUROLOGIC: Moves all 4's, in pain  Lines/Devices:  Left impella, right femoral IABP site well dressed  Telemetry   Sinus 90-100s, occasional PVCs  Labs    CBC Recent Labs    07/09/23 1456 07/09/23 1507 07/11/23 0525 07/12/23 0447  WBC 5.6   < > 7.0 5.8  NEUTROABS 5.2  --   --   --   HGB 13.5   < > 11.0* 10.7*  HCT 44.6   < > 35.7* 35.7*  MCV 86.9    < > 86.0 87.9  PLT 218   < > 129* 94*   < > = values in this interval not displayed.   Basic Metabolic Panel Recent Labs    93/79/74 0114 07/11/23 0525 07/11/23 1129 07/12/23 0447  NA 137   < > 139 134*  K 3.6   < > 3.5 3.2*  CL 101   < > 102 100  CO2 25   < > 24 27  GLUCOSE 91   < > 114* 87  BUN 19   < > 20 17  CREATININE 1.25*   < > 1.29* 0.94  CALCIUM  8.9   < > 9.1 8.9  MG 2.0  --   --  1.9   < > = values in this interval not displayed.   Liver Function Tests Recent Labs    07/09/23 1456 07/11/23 1129  AST 31 26  ALT 14 9  ALKPHOS 86 68  BILITOT 1.6* 1.9*  PROT 8.1 7.0  ALBUMIN  3.5 2.9*   BNP (last 3 results) Recent Labs    05/28/23 1224 06/30/23 1218 07/04/23 1057  BNP 1,155.3* 600.6* 612.0*   Fasting Lipid Panel Recent Labs    07/10/23 0346  TRIG 73   Medications:    Scheduled Medications:  acetaminophen   500 mg Oral Q6H  Chlorhexidine  Gluconate Cloth  6 each Topical Daily   feeding supplement  237 mL Oral BID BM   insulin  aspart  0-9 Units Subcutaneous Q4H   multivitamin with minerals  1 tablet Oral Daily   pantoprazole  (PROTONIX ) IV  40 mg Intravenous Daily   polyethylene glycol  17 g Oral BID   senna-docusate  2 tablet Oral BID   spironolactone   25 mg Oral Daily   Infusions:  amiodarone  30 mg/hr (07/12/23 1300)   furosemide  Stopped (07/12/23 1059)   heparin  600 Units/hr (07/12/23 1300)   milrinone  0.25 mcg/kg/min (07/12/23 1300)   norepinephrine  (LEVOPHED ) Adult infusion Stopped (07/11/23 0805)   sodium bicarbonate  25 mEq (Impella PURGE) in dextrose  5 % 1000 mL bag     PRN Medications: acetaminophen , albuterol , HYDROmorphone  (DILAUDID ) injection, ondansetron  (ZOFRAN ) IV, mouth rinse, oxyCODONE , oxyCODONE   Patient Profile   48 y.o. female with history of chronic systolic HF with biventricular dysfunction on home inotrope, severe MR, hx PE/DVT, obesity, CAD, anemia, tobacco use. Presented for mTEER on 07/09/23. Worsening MR leading to  cardiogenic shock.  Assessment/Plan   Cardiogenic shock: due to torrential MR s/p mTEER complicated by severely restricted posterior leaflet that was damaged during repair attempt, IABP and nipride  needed to stabilize. Transitioned to Impella 5.5. Consideration for LVAD workup on Monday, but she has significant social concerns. Not a txp candidate with tobacco use.  I do not believe that she would be able to survive without LVAD support, the alternative is comfort care.  - Start IV diuresis today, 120mg  BID lasix  - Continue impella 5.5 at P8 with 4.8L of flow - Stable occasional ectopy - Continue milrinone  0.25mcg/kg/min - Restart diuretics, CVP around 10 - Increase heparin  gtt to goal dose  Acute on chronic systolic HF with biventricular dysfunction - stage D cardiomyopathy - nonobstructive CAD on cath 2023.  - RHC 5/25 on milrinone  0.25: severe pulmonary venous HTN, severely elevated PCWP with prominent v waves, preserved CO - Continue milrinone  0.25 - needs stabilization and LVAD candidacy determination   Torrential MR - See above   Acute hypoxic respiratory failure - In setting of acute on chronic CHF and severe/torrential MR - Improved with unloading, extubated 6/20   Hx of PE/DVT w/ IVC filter - Evaluated at [facility] where thrombophilia work-up was negative (Lupus inhibitor, anticardiolipin antibodies, and anti beta-2  glycoprotein antibodies were negative; protein C activity 85%, protein S activity 76%, factor V Leiden, and prothrombin gene mutation ) - IVC filter in place; could not be removed due to significant thrombus burden on filter at [facility]  - Heparin  gtt for now with Impella and potential procedures   CAD - LHC in 5/23 w/ nonobstructive CAD involving RCA - No aspirin  with need for anticoagulation.  Tobacco use - Smoking 3 cigarettes/day at time of last follow-up.  Length of Stay: 3  CRITICAL CARE Performed by: Morene JINNY Brownie   Total critical care time: 40  minutes  Critical care time was exclusive of separately billable procedures and treating other patients.  Critical care was necessary to treat or prevent imminent or life-threatening deterioration.  Critical care was time spent personally by me on the following activities: development of treatment plan with patient and/or surrogate as well as nursing, discussions with consultants, evaluation of patient's response to treatment, examination of patient, obtaining history from patient or surrogate, ordering and performing treatments and interventions, ordering and review of laboratory studies, ordering and review of radiographic studies, pulse oximetry and re-evaluation of patient's condition.  Morene JINNY Brownie, MD  07/12/2023, 1:21 PM  Advanced Heart Failure Team Pager [phone] (M-F; 7a - 5p)  Please contact [facility] for night-coverage after hours (5p -7a ) and weekends on [email or web address]

## 2023-07-12 NOTE — Progress Notes (Addendum)
 PHARMACY - ANTICOAGULATION CONSULT NOTE  Pharmacy Consult for heparin  Indication: IABP>impella 5.5  Allergies  Allergen Reactions   Sulfa  Antibiotics Itching, Swelling and Rash    Lip swelling    Patient Measurements: Height: 5' 10 (177.8 cm) Weight: 111.4 kg (245 lb 9.5 oz) IBW/kg (Calculated) : 68.5 HEPARIN  DW (KG): 93  Vital Signs: Temp: 97.2 F (36.2 C) (06/21 0747) Temp Source: Core (06/21 0400) Pulse Rate: 77 (06/21 0747)  Labs: Recent Labs    07/09/23 1456 07/09/23 1507 07/09/23 2306 07/10/23 0346 07/10/23 0605 07/10/23 0844 07/10/23 1425 07/10/23 1751 07/11/23 0525 07/11/23 1129 07/11/23 1500 07/12/23 0447  HGB 13.5   < >  --  11.7*  --    < > 12.6  --  11.0*  --   --  10.7*  HCT 44.6   < >  --  37.9  --    < > 37.0  --  35.7*  --   --  35.7*  PLT 218  --   --  154  --   --   --   --  129*  --   --  94*  LABPROT 16.2*  --   --   --   --   --   --   --   --   --   --   --   INR 1.3*  --   --   --   --   --   --   --   --   --   --   --   HEPARINUNFRC  --   --  <0.10*  --  <0.10*  --   --   --   --   --  <0.10*  --   CREATININE 1.20*   < >  --  1.14*  --   --   --    < > 1.30* 1.29*  --  0.94   < > = values in this interval not displayed.    Estimated Creatinine Clearance: 101.2 mL/min (by C-G formula based on SCr of 0.94 mg/dL).   Medical History: Past Medical History:  Diagnosis Date   Anemia    Blood transfusion without reported diagnosis    CAD (coronary artery disease)    Nonobstructive   Closed left ankle fracture    August 30 2012   Clotting disorder (HCC)    DVT (deep venous thrombosis) (HCC)    L leg   Fibroid tumor    HFrEF (heart failure with reduced ejection fraction) (HCC)    Hidradenitis    Hypertension    Mitral regurgitation    Myocardial infarction (HCC)    NICM (nonischemic cardiomyopathy) (HCC)    NSVT (nonsustained ventricular tachycardia) (HCC)    Obesity    OSA on CPAP    Pulmonary embolism (HCC) 04/2013   Sleep  apnea     Medications:  Scheduled:   Chlorhexidine  Gluconate Cloth  6 each Topical Daily   feeding supplement  237 mL Oral BID BM   insulin  aspart  0-9 Units Subcutaneous Q4H   multivitamin with minerals  1 tablet Oral Daily   pantoprazole  (PROTONIX ) IV  40 mg Intravenous Daily   polyethylene glycol  17 g Oral BID   potassium chloride   40 mEq Oral Q4H   senna-docusate  2 tablet Oral BID    Assessment: 68 yof who presented for mTEER complicated with torn mitral valve leaflet resulting in torrential MR and cardiogenic shock requiring IABP. On  warfarin PTA for hx PE (LD 6/12) - was on enoxaparin  while warfarin on hold for procedure (LD 6/17).  Heparin  infusion for IABP restarted 2 hours after procedure 6/18. Impella placed 6/19 - restarted heparin  infusion at 500 units/hr. Heparin  level this morning is undetectable as expected on 500 units/hr. Hgb 10.7, plt 94, LDH 287. No s/sx of bleeding or infusion issues. Impella running at P8 without issues.  Goal of Therapy:  Heparin  level 0.3-0.5 units/ml Monitor platelets by anticoagulation protocol: Yes   Plan:  Increase heparin  IV to 600 units/hr Order heparin  level in 6 hr after increase  Daily heparin  level and CBC Monitor for signs/symptoms of bleeding   Thank you for allowing pharmacy to participate in this patient's care,  Suzen Sour, PharmD, BCCCP Clinical Pharmacist  Phone: [phone] 07/12/2023 8:26 AM  Please check AMION for all [facility] Pharmacy phone numbers After 10:00 PM, call Main Pharmacy [phone]   ADDENDUM Heparin  level came back subtherapeutic as expected on 600 units/hr. No s/sx of bleeding or infusion issues. No issues with Impella. Will increase heparin  infusion to 700 units/hr and get level in 6 hours after increase.  Thank you for allowing pharmacy to participate in this patient's care,  Suzen Sour, PharmD, BCCCP Clinical Pharmacist

## 2023-07-12 NOTE — Evaluation (Signed)
 Physical Therapy Evaluation Patient Details Name: [name] [name] MRN: [id number] DOB: 1975/02/28 Today's Date: 07/12/2023  History of Present Illness  48 yo presented 07/09/23 for transcatheter mitral valve repair with Mitraclip. Post-op severe residual MR and cardiogenic shock. 6/19 impella inserted; extubated 6/20; ?for LVAD workup  PMH-chronic HF (on home milrinone ); CAD, DVT, fibroid tumor, hidradenitis, cardiomyopathy, NSVT, PE, obesity  Clinical Impression   Pt admitted secondary to problem above with deficits below. PTA patient lives with mother, step-father, and 53 yo daughter in a mobile home with 3 steps to enter. Pt currently has impella device and was lethargic. She was able to get OOB to chair with min assist +2 for lines (RN present throughout).  Anticipate patient will benefit from PT to address problems listed below. Will continue to follow acutely to maximize functional mobility, independence, and safety. Noted plans for ?LVAD workup. Patient may need intensive inpatient therapies >3 hrs/day prior to return home.          If plan is discharge home, recommend the following: Two people to help with walking and/or transfers;Two people to help with bathing/dressing/bathroom;Assistance with cooking/housework;Direct supervision/assist for medications management;Direct supervision/assist for financial management;Assist for transportation;Help with stairs or ramp for entrance;Supervision due to cognitive status   Can travel by private vehicle        Equipment Recommendations Rollator (4 wheels);BSC/3in1  Recommendations for Other Services       Functional Status Assessment Patient has had a recent decline in their functional status and demonstrates the ability to make significant improvements in function in a reasonable and predictable amount of time.     Precautions / Restrictions Precautions Precautions: Fall;Other (comment) Precaution/Restrictions Comments: impella L shoulder  access      Mobility  Bed Mobility Overal bed mobility: Needs Assistance Bed Mobility: Supine to Sit     Supine to sit: Min assist, HOB elevated     General bed mobility comments: not able to push or pull with LUE; exit to her rt with min assist to raise torso    Transfers Overall transfer level: Needs assistance Equipment used: 1 person hand held assist Transfers: Sit to/from Stand, Bed to chair/wheelchair/BSC Sit to Stand: Min assist, +2 safety/equipment   Step pivot transfers: Contact guard assist       General transfer comment: pt following instructions to move slowly as lines manipulated while she was in standing    Ambulation/Gait               General Gait Details: unable at this time  Stairs            Wheelchair Mobility     Tilt Bed    Modified Rankin (Stroke Patients Only)       Balance Overall balance assessment: Needs assistance Sitting-balance support: No upper extremity supported, Feet unsupported Sitting balance-Leahy Scale: Good     Standing balance support: Single extremity supported, During functional activity Standing balance-Leahy Scale: Poor                               Pertinent Vitals/Pain Pain Assessment Pain Assessment: Faces Faces Pain Scale: Hurts little more Pain Location: left shoulder Pain Descriptors / Indicators: Aching, Discomfort, Grimacing, Guarding Pain Intervention(s): Limited activity within patient's tolerance, Monitored during session    Home Living Family/patient expects to be discharged to:: Private residence Living Arrangements: Parent;Children (60 yo daughter; mother, stepfather) Available Help at Discharge: Family;Available PRN/intermittently (mother works) Type  of Home: Mobile home Home Access: Stairs to enter Entrance Stairs-Rails: Right;Left;Can reach both Entrance Stairs-Number of Steps: 3   Home Layout: One level Home Equipment: None Additional Comments: Daughter is  currently staying with pt's aunt.    Prior Function Prior Level of Function : Independent/Modified Independent             Mobility Comments: not working--on disability       Extremity/Trunk Assessment   Upper Extremity Assessment Upper Extremity Assessment: Defer to OT evaluation    Lower Extremity Assessment Lower Extremity Assessment: Generalized weakness    Cervical / Trunk Assessment Cervical / Trunk Assessment: Other exceptions Cervical / Trunk Exceptions: overweight  Communication   Communication Communication: Impaired Factors Affecting Communication: Reduced clarity of speech    Cognition Arousal: Lethargic Behavior During Therapy: Flat affect   PT - Cognitive impairments: Difficult to assess Difficult to assess due to: Level of arousal                     PT - Cognition Comments: Answering home setup questions slowly and with incr time/thought; followed commands Following commands: Intact       Cueing Cueing Techniques: Verbal cues     General Comments General comments (skin integrity, edema, etc.): RN present throughout session. Impella at P8; HR 70s; on O2    Exercises General Exercises - Lower Extremity Ankle Circles/Pumps: AROM, Both, 15 reps Long Arc Quad: AROM, Both, 10 reps   Assessment/Plan    PT Assessment Patient needs continued PT services  PT Problem List Decreased strength;Decreased activity tolerance;Decreased balance;Decreased mobility;Decreased knowledge of use of DME;Decreased knowledge of precautions;Cardiopulmonary status limiting activity;Obesity;Pain       PT Treatment Interventions DME instruction;Gait training;Functional mobility training;Therapeutic activities;Therapeutic exercise;Balance training;Patient/family education    PT Goals (Current goals can be found in the Care Plan section)  Acute Rehab PT Goals Patient Stated Goal: to get better PT Goal Formulation: With patient Time For Goal Achievement:  07/26/23 Potential to Achieve Goals: Fair    Frequency Min 2X/week     Co-evaluation               AM-PAC PT 6 Clicks Mobility  Outcome Measure Help needed turning from your back to your side while in a flat bed without using bedrails?: A Little Help needed moving from lying on your back to sitting on the side of a flat bed without using bedrails?: A Little Help needed moving to and from a bed to a chair (including a wheelchair)?: A Little Help needed standing up from a chair using your arms (e.g., wheelchair or bedside chair)?: A Little Help needed to walk in hospital room?: Total Help needed climbing 3-5 steps with a railing? : Total 6 Click Score: 14    End of Session Equipment Utilized During Treatment: Oxygen Activity Tolerance: Patient limited by lethargy Patient left: in chair;with call bell/phone within reach;with nursing/sitter in room Nurse Communication: Mobility status PT Visit Diagnosis: Unsteadiness on feet (R26.81);Muscle weakness (generalized) (M62.81);Difficulty in walking, not elsewhere classified (R26.2)    Time: 1332-1400 PT Time Calculation (min) (ACUTE ONLY): 28 min   Charges:   PT Evaluation $PT Eval Low Complexity: 1 Low PT Treatments $Gait Training: 8-22 mins PT General Charges $$ ACUTE PT VISIT: 1 Visit          Macario RAMAN, PT Acute Rehabilitation Services  Office [phone]   Macario SHAUNNA Soja 07/12/2023, 2:26 PM

## 2023-07-12 NOTE — Progress Notes (Signed)
 2 Days Post-Op Procedure(s) (LRB): INSERTION, CARDIAC ASSIST DEVICE, IMPELLA 5.5 (Left) ECHOCARDIOGRAM, TRANSESOPHAGEAL, INTRAOPERATIVE (N/A) Subjective: Sleeping this AM  Objective: Vital signs in last 24 hours: Temp:  [96.6 F (35.9 C)-99.3 F (37.4 C)] 96.8 F (36 C) (06/21 0600) Pulse Rate:  [59-149] 70 (06/21 0600) Cardiac Rhythm: Normal sinus rhythm (06/20 2000) Resp:  [14-36] 23 (06/21 0600) BP: (111-123)/(78-95) 111/78 (06/20 1400) SpO2:  [89 %-100 %] 99 % (06/21 0600) Arterial Line BP: (81-126)/(66-96) 96/79 (06/21 0600) FiO2 (%):  [40 %] 40 % (06/20 1029)  Hemodynamic parameters for last 24 hours: PAP: (50-107)/(27-46) 88/34 CVP:  [0 mmHg-21 mmHg] 12 mmHg CO:  [5.3 L/min-5.9 L/min] 5.9 L/min CI:  [2.32 L/min/m2-2.58 L/min/m2] 2.58 L/min/m2  Intake/Output from previous day: 06/20 0701 - 06/21 0700 In: 1243.7 [P.O.:240; I.V.:837.1] Out: 775 [Urine:755; Drains:20] Intake/Output this shift: No intake/output data recorded.  Wound: Impella site clean  Lab Results: Recent Labs    07/11/23 0525 07/12/23 0447  WBC 7.0 5.8  HGB 11.0* 10.7*  HCT 35.7* 35.7*  PLT 129* 94*   BMET:  Recent Labs    07/11/23 1129 07/12/23 0447  NA 139 134*  K 3.5 3.2*  CL 102 100  CO2 24 27  GLUCOSE 114* 87  BUN 20 17  CREATININE 1.29* 0.94  CALCIUM  9.1 8.9    PT/INR:  Recent Labs    07/09/23 1456  LABPROT 16.2*  INR 1.3*   ABG    Component Value Date/Time   PHART 7.383 07/10/2023 1425   HCO3 27.9 07/10/2023 1425   TCO2 29 07/10/2023 1425   O2SAT 74.3 07/12/2023 0447   CBG (last 3)  Recent Labs    07/11/23 2320 07/12/23 0300 07/12/23 0739  GLUCAP 82 95 93    Assessment/Plan: S/P Procedure(s) (LRB): INSERTION, CARDIAC ASSIST DEVICE, IMPELLA 5.5 (Left) ECHOCARDIOGRAM, TRANSESOPHAGEAL, INTRAOPERATIVE (N/A) - Impella working well p8 with 4.7 L flow Co-ox 74 JP with 20 ml out last 24 hours- dc   LOS: 3 days    Elspeth JAYSON Millers 07/12/2023

## 2023-07-12 NOTE — Progress Notes (Signed)
 Inpatient Rehab Admissions Coordinator:   Per therapy recommendations,  patient was screened for CIR candidacy by Leita Kleine, MS, CCC-SLP.  At this time, Pt. is not medically ready for CIR (undergoing LVAD work up). I will not pursue a rehab consult for this Pt. at this time, but CIR admissions team will follow and monitor for medical readiness and place consult order if Pt. appears to be an appropriate candidate. Please contact me with any questions.  Leita Kleine, MS, CCC-SLP Rehab Admissions Coordinator  [phone] (office)   Leita Kleine, MS, CCC-SLP Rehab Admissions Coordinator  [phone] (office)

## 2023-07-12 NOTE — Plan of Care (Signed)

## 2023-07-12 NOTE — Progress Notes (Signed)
 NAME:  [name] [name], MRN:  [id number], DOB:  1976/01/06, LOS: 3 ADMISSION DATE:  07/09/2023, CONSULTATION DATE:  07/09/23 REFERRING MD:  AHF, CHIEF COMPLAINT:  vent management   History of Present Illness:  Pt encephalopathic, therefore HPI obtained from EMR.  80 yoF with PMH as below significant for tobacco abuse, CAD, NICM, chronic biventricular HF on chronic milrinone , NSVT, OSA on CPAP, saddle DVT/ PE 2015 s/p IVC on coumadin  (IVC filter not able to be removed due to clot burden), non-compliance, pancytopenia (followed at [facility]) and obesity.    Previously admitted in April 2025 with cardiogenic shock, acute on chronic biventricular HF with overload, echo then with EF 20-25%, severe RV dysfunction, and mod-severe MR.  Was diuresed and discharged home on milrinone  with plans for outpt workup for advanced therapies.  Repeat outpt TEE 06/02/23 showed EF 40-45%, severely dilated  mod reduced RV, severe MR.  Returned today for [employer] today, however procedure complicated by posterior torn leaflet with torrential MR, worsening hypoxia requiring intubation, and cardiogenic shock.  PCCM consulted for assistance with MV management.    Pertinent  Medical History   Past Medical History:  Diagnosis Date   Anemia    Blood transfusion without reported diagnosis    CAD (coronary artery disease)    Nonobstructive   Closed left ankle fracture    August 30 2012   Clotting disorder [facility])    DVT (deep venous thrombosis) (HCC)    L leg   Fibroid tumor    HFrEF (heart failure with reduced ejection fraction) (HCC)    Hidradenitis    Hypertension    Mitral regurgitation    Myocardial infarction (HCC)    NICM (nonischemic cardiomyopathy) (HCC)    NSVT (nonsustained ventricular tachycardia) (HCC)    Obesity    OSA on CPAP    Pulmonary embolism (HCC) 04/2013   Sleep apnea    Significant Hospital Events: Including procedures, antibiotic start and stop dates in addition to other pertinent events   6/18  elective mTEER c/b torn leaflet, torriential MR with progressive hypoxia> intubated and cardiogenic shock requiring IABP 6/19 for impella 5.5  Off NE  6/20 back on low dose NE, off lasix  gtt    Interim History / Subjective:   No breathing complaints, but very upset about the temperature of her food and things not happening quickly enough. Afebrile overnight.   Objective    Blood pressure 111/78, pulse 80, temperature (!) 97.5 F (36.4 C), resp. rate 18, height 5' 10 (1.778 m), weight 111.4 kg, SpO2 96%. PAP: (72-107)/(27-46) 97/40 CVP:  [0 mmHg-21 mmHg] 12 mmHg PCWP:  [38 mmHg] 38 mmHg CO:  [5.1 L/min-5.9 L/min] 5.1 L/min CI:  [2.27 L/min/m2-2.58 L/min/m2] 2.27 L/min/m2  Vent Mode: PSV;CPAP FiO2 (%):  [40 %] 40 % PEEP:  [8 cmH20] 8 cmH20 Pressure Support:  [8 cmH20] 8 cmH20   Intake/Output Summary (Last 24 hours) at 07/12/2023 0935 Last data filed at 07/12/2023 0900 Gross per 24 hour  Intake 1290.65 ml  Output 840 ml  Net 450.65 ml   Filed Weights   07/09/23 0636 07/10/23 0349  Weight: 110.2 kg 111.4 kg   Examination: General:  chronically ill appearing woman lying in bed in NAD Neuro: awake, talking on the phone, moving extremities HEENT:[place]/AT, eyes anicteric CV: L axillary impella PULM:  breathing comfortably on , no conversational dyspnea GI:   nondistended  Coox 74% Na+ 134 K+ 3.2   Resolved problem list  AKI   Assessment and  Plan   Acute hypoxic respiratory failure due to acute pulmonary edema Tobacco use OSA on CPAP  -wean O2 as able -recommend tobacco cessation -recommend nocturnal CPAP -pulmonary hygiene -OOB mobility  Severe MR w/ flail leaflet Acute on chronic Biventrciular heart failure, chronic inotrope dependence Acute on chronic cardiogenic shock CAD NICM  -impella 5.5 placed 6/20; titration and management per AHF -heparin  gtt -tele monitoring -monitor electrolytes and replete as needed -serial coox  AKI,  resolved hyponatremia -strict I/O -maintian adequate perfusion  Hypokalemia -replete, monitor  Hx DVT/ PE on coumadin  + s/p IVC filter  -heparin   Anemia Thrombocytopenia due to impella -monitor; no current indication for transfusion  PCCM will be available as needed, please call with questions.    Best Practice (right click and Reselect all SmartList Selections daily)   Diet/type: Regular consistency (see orders) DVT prophylaxis systemic heparin  Pressure ulcer(s): defer to RN assessment GI prophylaxis: PPI Lines: Central line, Arterial Line, and yes and it is still needed Foley:  Yes, and it is still needed Code Status:  full code Last date of multidisciplinary goals of care discussion [per primary team]  Labs   CBC: Recent Labs  Lab 07/09/23 1456 07/09/23 1507 07/10/23 0346 07/10/23 0844 07/10/23 1425 07/11/23 0525 07/12/23 0447  WBC 5.6  --  11.5*  --   --  7.0 5.8  NEUTROABS 5.2  --   --   --   --   --   --   HGB 13.5   < > 11.7* 12.6 12.6 11.0* 10.7*  HCT 44.6   < > 37.9 37.0 37.0 35.7* 35.7*  MCV 86.9  --  85.0  --   --  86.0 87.9  PLT 218  --  154  --   --  129* 94*   < > = values in this interval not displayed.    Basic Metabolic Panel: Recent Labs  Lab 07/09/23 2053 07/10/23 0345 07/10/23 0346 07/10/23 1751 07/11/23 0114 07/11/23 0525 07/11/23 1129 07/12/23 0447  NA 137  --    < > 138 137 138 139 134*  K 3.6  --    < > 3.1* 3.6 4.0 3.5 3.2*  CL 100  --    < > 101 101 103 102 100  CO2 24  --    < > 26 25 26 24 27   GLUCOSE 116*  --    < > 104* 91 102* 114* 87  BUN 15  --    < > 16 19 20 20 17   CREATININE 1.17*  --    < > 1.27* 1.25* 1.30* 1.29* 0.94  CALCIUM  8.6*  --    < > 8.9 8.9 9.0 9.1 8.9  MG 1.8 2.1  --   --  2.0  --   --  1.9   < > = values in this interval not displayed.   GFR: Estimated Creatinine Clearance: 101.2 mL/min (by C-G formula based on SCr of 0.94 mg/dL). Recent Labs  Lab 07/09/23 1456 07/09/23 1504 07/09/23 1639  07/10/23 0346 07/11/23 0525 07/11/23 1135 07/12/23 0447 07/12/23 0839  WBC 5.6  --   --  11.5* 7.0  --  5.8  --   LATICACIDVEN  --  0.9 1.3  --   --  1.0  --  0.6    Liver Function Tests: Recent Labs  Lab 07/09/23 1456 07/11/23 1129  AST 31 26  ALT 14 9  ALKPHOS 86 68  BILITOT 1.6* 1.9*  PROT 8.1  7.0  ALBUMIN  3.5 2.9DEWAINE Leita SHAUNNA Gretta, DO 07/12/23 10:07 AM [facility]  For contact information, see Amion. If no response to pager, please call PCCM consult pager. After hours, 7PM- 7AM, please call Elink.

## 2023-07-13 DIAGNOSIS — R570 Cardiogenic shock: Secondary | ICD-10-CM | POA: Diagnosis not present

## 2023-07-13 DIAGNOSIS — I340 Nonrheumatic mitral (valve) insufficiency: Secondary | ICD-10-CM | POA: Diagnosis not present

## 2023-07-13 LAB — GLUCOSE, CAPILLARY
Glucose-Capillary: 104 mg/dL — ABNORMAL HIGH (ref 70–99)
Glucose-Capillary: 111 mg/dL — ABNORMAL HIGH (ref 70–99)
Glucose-Capillary: 114 mg/dL — ABNORMAL HIGH (ref 70–99)
Glucose-Capillary: 118 mg/dL — ABNORMAL HIGH (ref 70–99)
Glucose-Capillary: 119 mg/dL — ABNORMAL HIGH (ref 70–99)
Glucose-Capillary: 124 mg/dL — ABNORMAL HIGH (ref 70–99)
Glucose-Capillary: 99 mg/dL (ref 70–99)

## 2023-07-13 LAB — BASIC METABOLIC PANEL WITH GFR
Anion gap: 10 (ref 5–15)
BUN: 17 mg/dL (ref 6–20)
CO2: 28 mmol/L (ref 22–32)
Calcium: 8.9 mg/dL (ref 8.9–10.3)
Chloride: 97 mmol/L — ABNORMAL LOW (ref 98–111)
Creatinine, Ser: 0.92 mg/dL (ref 0.44–1.00)
GFR, Estimated: >60 mL/min (ref >60)
Glucose, Bld: 102 mg/dL — ABNORMAL HIGH (ref 70–99)
Potassium: 4.2 mmol/L (ref 3.5–5.1)
Sodium: 135 mmol/L (ref 135–145)

## 2023-07-13 LAB — CBC
HCT: 35.4 % — ABNORMAL LOW (ref 36.0–46.0)
Hemoglobin: 10.6 g/dL — ABNORMAL LOW (ref 12.0–15.0)
MCH: 26.8 pg (ref 26.0–34.0)
MCHC: 29.9 g/dL — ABNORMAL LOW (ref 30.0–36.0)
MCV: 89.4 fL (ref 80.0–100.0)
Platelets: 83 10*3/uL — ABNORMAL LOW (ref 150–400)
RBC: 3.96 MIL/uL (ref 3.87–5.11)
RDW: 21.4 % — ABNORMAL HIGH (ref 11.5–15.5)
WBC: 7.2 10*3/uL (ref 4.0–10.5)
nRBC: 0 % (ref 0.0–0.2)

## 2023-07-13 LAB — COOXEMETRY PANEL
Carboxyhemoglobin: 2.4 % — ABNORMAL HIGH (ref 0.5–1.5)
Methemoglobin: <0.7 % (ref 0.0–1.5)
O2 Saturation: 68.4 %
Total hemoglobin: 11 g/dL — ABNORMAL LOW (ref 12.0–16.0)

## 2023-07-13 LAB — HEPATIC FUNCTION PANEL
ALT: 11 U/L (ref 0–44)
AST: 27 U/L (ref 15–41)
Albumin: 2.9 g/dL — ABNORMAL LOW (ref 3.5–5.0)
Alkaline Phosphatase: 76 U/L (ref 38–126)
Bilirubin, Direct: 0.8 mg/dL — ABNORMAL HIGH (ref 0.0–0.2)
Indirect Bilirubin: 1.1 mg/dL — ABNORMAL HIGH (ref 0.3–0.9)
Total Bilirubin: 1.9 mg/dL — ABNORMAL HIGH (ref 0.0–1.2)
Total Protein: 7.1 g/dL (ref 6.5–8.1)

## 2023-07-13 LAB — MAGNESIUM: Magnesium: 1.9 mg/dL (ref 1.7–2.4)

## 2023-07-13 LAB — HEPARIN LEVEL (UNFRACTIONATED)
Heparin Unfractionated: <0.1 [IU]/mL — ABNORMAL LOW (ref 0.30–0.70)
Heparin Unfractionated: <0.1 [IU]/mL — ABNORMAL LOW (ref 0.30–0.70)
Heparin Unfractionated: <0.1 [IU]/mL — ABNORMAL LOW (ref 0.30–0.70)

## 2023-07-13 LAB — LACTATE DEHYDROGENASE: LDH: 328 U/L — ABNORMAL HIGH (ref 98–192)

## 2023-07-13 MED ORDER — MAGNESIUM SULFATE 2 GM/50ML IV SOLN
2.0000 g | Freq: Once | INTRAVENOUS | Status: AC
  Administered 2023-07-13: 2 g via INTRAVENOUS
  Filled 2023-07-13: qty 50

## 2023-07-13 MED ORDER — LACTULOSE 10 GM/15ML PO SOLN
20.0000 g | Freq: Two times a day (BID) | ORAL | Status: AC
  Administered 2023-07-13 (×2): 20 g via ORAL
  Filled 2023-07-13: qty 30

## 2023-07-13 MED ORDER — FUROSEMIDE 10 MG/ML IJ SOLN
120.0000 mg | Freq: Once | INTRAVENOUS | Status: AC
  Administered 2023-07-13: 120 mg via INTRAVENOUS
  Filled 2023-07-13: qty 10

## 2023-07-13 NOTE — Progress Notes (Signed)
 PHARMACY - ANTICOAGULATION CONSULT NOTE  Pharmacy Consult for heparin  Indication: IABP>impella 5.5  Allergies  Allergen Reactions   Sulfa  Antibiotics Itching, Swelling and Rash    Lip swelling    Patient Measurements: Height: 5' 10 (177.8 cm) Weight: 111.4 kg (245 lb 9.5 oz) IBW/kg (Calculated) : 68.5 HEPARIN  DW (KG): 93  Vital Signs: Temp: 98.2 F (36.8 C) (06/22 1300) Temp Source: Core (06/22 0400) BP: 87/65 (06/22 0815) Pulse Rate: 90 (06/22 1300)  Labs: Recent Labs    07/11/23 0525 07/11/23 1129 07/12/23 0447 07/12/23 0843 07/12/23 1421 07/12/23 1448 07/12/23 2254 07/13/23 0402 07/13/23 1315  HGB 11.0*  --  10.7*  --   --   --   --  10.6*  --   HCT 35.7*  --  35.7*  --   --   --   --  35.4*  --   PLT 129*  --  94*  --   --   --   --  83*  --   HEPARINUNFRC  --    < >  --    < >  --    < > <0.10* <0.10* <0.10*  CREATININE 1.30*   < > 0.94  --  0.89  --   --  0.92  --    < > = values in this interval not displayed.    Estimated Creatinine Clearance: 103.4 mL/min (by C-G formula based on SCr of 0.92 mg/dL).  Assessment: 56 yof who presented for mTEER complicated with torn mitral valve leaflet resulting in torrential MR and cardiogenic shock requiring IABP. On warfarin PTA for hx PE (LD 6/12) - was on enoxaparin  while warfarin on hold for procedure (LD 6/17).  Heparin  infusion for IABP restarted 2 hours after procedure 6/18. Impella placed 6/19 - restarted heparin  infusion at 500 units/hr.   Heparin  level came back subtherapeutic (<0.1), on 900 units/hr. Hgb 10.6, plt down-trending to 83. LDH 328. No s/sx of bleeding or infusion issues. Impella running at P8 without issues - using sodium bicarbonate  purge.   Goal of Therapy:  Heparin  level 0.3-0.5 units/ml Monitor platelets by anticoagulation protocol: Yes   Plan:  Increase heparin  IV to 1000 units/hr Heparin  level in 6 hr after increase  Daily heparin  level and CBC (monitor platelets) Monitor for  signs/symptoms of bleeding  Thank you for allowing pharmacy to participate in this patient's care,  Suzen Sour, PharmD, BCCCP Clinical Pharmacist  Phone: [phone] 07/13/2023 1:42 PM  Please check AMION for all [facility] Pharmacy phone numbers After 10:00 PM, call Main Pharmacy [phone]

## 2023-07-13 NOTE — Progress Notes (Signed)
 PHARMACY - ANTICOAGULATION CONSULT NOTE  Pharmacy Consult for heparin  Indication: IABP>impella 5.5  Allergies  Allergen Reactions   Sulfa  Antibiotics Itching, Swelling and Rash    Lip swelling    Patient Measurements: Height: 5' 10 (177.8 cm) Weight: 111.4 kg (245 lb 9.5 oz) IBW/kg (Calculated) : 68.5 HEPARIN  DW (KG): 93  Vital Signs: Temp: 97.3 F (36.3 C) (06/22 0400) Temp Source: Core (06/22 0400) Pulse Rate: 79 (06/22 0400)  Labs: Recent Labs    07/10/23 1425 07/10/23 1751 07/11/23 0525 07/11/23 1129 07/12/23 0447 07/12/23 0843 07/12/23 1421 07/12/23 1448 07/12/23 2254 07/13/23 0402  HGB 12.6  --  11.0*  --  10.7*  --   --   --   --   --   HCT 37.0  --  35.7*  --  35.7*  --   --   --   --   --   PLT  --   --  129*  --  94*  --   --   --   --   --   HEPARINUNFRC  --   --   --    < >  --    < >  --  <0.10* <0.10* <0.10*  CREATININE  --    < > 1.30*   < > 0.94  --  0.89  --   --  0.92   < > = values in this interval not displayed.    Estimated Creatinine Clearance: 103.4 mL/min (by C-G formula based on SCr of 0.92 mg/dL).  Assessment: 34 yof who presented for mTEER complicated with torn mitral valve leaflet resulting in torrential MR and cardiogenic shock requiring IABP. On warfarin PTA for hx PE (LD 6/12) - was on enoxaparin  while warfarin on hold for procedure (LD 6/17).  Heparin  infusion for IABP restarted 2 hours after procedure 6/18. Impella placed 6/19 - restarted heparin  infusion at 500 units/hr. Heparin  level this morning is undetectable as expected on 500 units/hr. Hgb 10.7, plt 94, LDH 287. No s/sx of bleeding or infusion issues. Impella running at P8 without issues.  Heparin  level came back subtherapeutic  on 800 units/hr. No s/sx of bleeding or infusion issues per RN. No issues with Impella. CBC still in process  Goal of Therapy:  Heparin  level 0.3-0.5 units/ml Monitor platelets by anticoagulation protocol: Yes   Plan:  Increase heparin  IV to  900 units/hr Heparin  level in 6 hr after increase  Daily heparin  level and CBC (monitor platelets) Monitor for signs/symptoms of bleeding  Thank you for allowing pharmacy to participate in this patient's care,  Lynwood Poplar, PharmD, BCPS Clinical Pharmacist 07/13/2023 5:10 AM

## 2023-07-13 NOTE — Progress Notes (Signed)
 Advanced Heart Failure Rounding Note  Cardiologist: Ria Commander, DO  Chief Complaint: Cardiogenic Shock Subjective:    6/17: mTEER with sev posterior leaflet restriction resulting in tear. IABP + PAC. PCWP 40, v up to 60  Coox 68 this morning, TD on swan also reduced. CVP now 10-12, PA pressures remain stable. No longer pulsatile on P8, turned to P4 with return of pulsatility and worsening Pad.   Long discussion this morning that she has extremely limited options. Her heart is very unlikely to recover without support and given her LV/PH dysfunction do not believe that mitral valve surgery is an option. We discussed that without an LVAD she will likely pass.   Swan#s: PAP: (60-112)/(25-49) 70/37 CVP:  [2 mmHg-23 mmHg] 13 mmHg CO:  [4.1 L/min-6.6 L/min] 5.7 L/min CI:  [1.8 L/min/m2-2.93 L/min/m2] 2.5 L/min/m2   Objective:    Weight Range: 111.4 kg Body mass index is 35.24 kg/m.   Vital Signs:   Temp:  [97 F (36.1 C)-98.2 F (36.8 C)] 98.2 F (36.8 C) (06/22 1200) Pulse Rate:  [57-172] 81 (06/22 1200) Resp:  [15-33] 20 (06/22 1200) BP: (87)/(65) 87/65 (06/22 0815) SpO2:  [92 %-100 %] 93 % (06/22 1200) Arterial Line BP: (75-110)/(63-88) 92/76 (06/22 1200) Last BM Date :  (PTA)  Weight change: Filed Weights   07/09/23 0636 07/10/23 0349  Weight: 110.2 kg 111.4 kg   Intake/Output:  Intake/Output Summary (Last 24 hours) at 07/13/2023 1230 Last data filed at 07/13/2023 1200 Gross per 24 hour  Intake 1113.53 ml  Output 1940 ml  Net -826.47 ml    Physical Exam   GENERAL: extubated, soft voice PULM:  Normal WOB, sore chest CARDIAC:  JVP: mildly elevated Normal rate and rhythm, no audible murmur ABDOMEN: Soft, non-tender, non-distended. NEUROLOGIC: Moves all 4's,  Lines/Devices:  Left axillary impella Telemetry   Sinus 80s, occasional PVCs  Labs    CBC Recent Labs    07/12/23 0447 07/13/23 0402  WBC 5.8 7.2  HGB 10.7* 10.6*  HCT 35.7* 35.4*  MCV  87.9 89.4  PLT 94* 83*   Basic Metabolic Panel Recent Labs    93/78/74 0447 07/12/23 1421 07/13/23 0402  NA 134* 136 135  K 3.2* 4.3 4.2  CL 100 100 97*  CO2 27 25 28   GLUCOSE 87 128* 102*  BUN 17 16 17   CREATININE 0.94 0.89 0.92  CALCIUM  8.9 8.6* 8.9  MG 1.9  --  1.9   Liver Function Tests Recent Labs    07/11/23 1129 07/13/23 0402  AST 26 27  ALT 9 11  ALKPHOS 68 76  BILITOT 1.9* 1.9*  PROT 7.0 7.1  ALBUMIN  2.9* 2.9*   BNP (last 3 results) Recent Labs    05/28/23 1224 06/30/23 1218 07/04/23 1057  BNP 1,155.3* 600.6* 612.0*   Fasting Lipid Panel No results for input(s): CHOL, HDL, LDLCALC, TRIG, CHOLHDL, LDLDIRECT in the last 72 hours.  Medications:    Scheduled Medications:  acetaminophen   500 mg Oral Q6H   Chlorhexidine  Gluconate Cloth  6 each Topical Daily   feeding supplement  237 mL Oral BID BM   insulin  aspart  0-9 Units Subcutaneous Q4H   lactulose  20 g Oral BID   multivitamin with minerals  1 tablet Oral Daily   pantoprazole  (PROTONIX ) IV  40 mg Intravenous Daily   polyethylene glycol  17 g Oral BID   senna-docusate  2 tablet Oral BID   spironolactone   25 mg Oral Daily  Infusions:  amiodarone  30 mg/hr (07/13/23 1200)   heparin  900 Units/hr (07/13/23 1200)   milrinone  0.375 mcg/kg/min (07/13/23 1200)   norepinephrine  (LEVOPHED ) Adult infusion Stopped (07/13/23 0919)   sodium bicarbonate  25 mEq (Impella PURGE) in dextrose  5 % 1000 mL bag     PRN Medications: acetaminophen , albuterol , ALPRAZolam , HYDROmorphone  (DILAUDID ) injection, ondansetron  (ZOFRAN ) IV, mouth rinse, oxyCODONE , oxyCODONE   Patient Profile   48 y.o. female with history of chronic systolic HF with biventricular dysfunction on home inotrope, severe MR, hx PE/DVT, obesity, CAD, anemia, tobacco use. Presented for mTEER on 07/09/23. Worsening MR leading to cardiogenic shock.  Assessment/Plan   Cardiogenic shock: due to torrential MR s/p mTEER complicated by  severely restricted posterior leaflet that was damaged during repair attempt, IABP and nipride  needed to stabilize. Transitioned to Impella 5.5. Consideration for LVAD workup on Monday, but she has significant social concerns. Not a txp candidate with tobacco use.  I do not believe that she would be able to survive without LVAD support, the alternative is comfort care.  - Continue diuresis, IV lasix  120mg  x1 today - Continue Impella 5.5 at P8, 4.7LPM - Mild increase in LDH, Tbili elevated but stable - Consider repeat echo with adjustment for worsening - Stable occasional ectopy - Increase milrinone  to 0.349mcg/kg/min - Increase heparin  gtt to goal dose  Acute on chronic systolic HF with biventricular dysfunction - stage D cardiomyopathy - nonobstructive CAD on cath 2023.  - RHC 5/25 on milrinone  0.25: severe pulmonary venous HTN, severely elevated PCWP with prominent v waves, preserved CO - Continue milrinone  0.375 - needs stabilization and LVAD candidacy determination   Torrential MR - See above   Acute hypoxic respiratory failure - In setting of acute on chronic CHF and severe/torrential MR - Improved with unloading, extubated 6/20   Hx of PE/DVT w/ IVC filter - Evaluated at [facility] where thrombophilia work-up was negative (Lupus inhibitor, anticardiolipin antibodies, and anti beta-2  glycoprotein antibodies were negative; protein C activity 85%, protein S activity 76%, factor V Leiden, and prothrombin gene mutation ) - IVC filter in place; could not be removed due to significant thrombus burden on filter at [facility]  - Heparin  gtt for now with Impella and potential procedures   CAD - LHC in 5/23 w/ nonobstructive CAD involving RCA - No aspirin  with need for anticoagulation.  Tobacco use - Smoking 3 cigarettes/day at time of last follow-up.  Length of Stay: 4  CRITICAL CARE Performed by: Morene JINNY Brownie   Total critical care time: 50 minutes  Critical care time was exclusive of  separately billable procedures and treating other patients.  Critical care was necessary to treat or prevent imminent or life-threatening deterioration.  Critical care was time spent personally by me on the following activities: development of treatment plan with patient and/or surrogate as well as nursing, discussions with consultants, evaluation of patient's response to treatment, examination of patient, obtaining history from patient or surrogate, ordering and performing treatments and interventions, ordering and review of laboratory studies, ordering and review of radiographic studies, pulse oximetry and re-evaluation of patient's condition.    Morene JINNY Brownie, MD  07/13/2023, 12:30 PM  Advanced Heart Failure Team Pager [phone] (M-F; 7a - 5p)  Please contact [facility] for night-coverage after hours (5p -7a ) and weekends on [email or web address]

## 2023-07-13 NOTE — Progress Notes (Signed)
 PHARMACY - ANTICOAGULATION CONSULT NOTE  Pharmacy Consult for heparin  Indication: IABP>impella 5.5  Allergies  Allergen Reactions   Sulfa  Antibiotics Itching, Swelling and Rash    Lip swelling    Patient Measurements: Height: 5' 10 (177.8 cm) Weight: 111.4 kg (245 lb 9.5 oz) IBW/kg (Calculated) : 68.5 HEPARIN  DW (KG): 93  Vital Signs: Temp: 98.1 F (36.7 C) (06/22 2200) Temp Source: Core (06/22 2000) Pulse Rate: 39 (06/22 2200)  Labs: Recent Labs    07/11/23 0525 07/11/23 1129 07/12/23 0447 07/12/23 0843 07/12/23 1421 07/12/23 1448 07/13/23 0402 07/13/23 1315 07/13/23 2056  HGB 11.0*  --  10.7*  --   --   --  10.6*  --   --   HCT 35.7*  --  35.7*  --   --   --  35.4*  --   --   PLT 129*  --  94*  --   --   --  83*  --   --   HEPARINUNFRC  --    < >  --    < >  --    < > <0.10* <0.10* <0.10*  CREATININE 1.30*   < > 0.94  --  0.89  --  0.92  --   --    < > = values in this interval not displayed.    Estimated Creatinine Clearance: 103.4 mL/min (by C-G formula based on SCr of 0.92 mg/dL).  Assessment: 47 yof who presented for mTEER complicated with torn mitral valve leaflet resulting in torrential MR and cardiogenic shock requiring IABP. On warfarin PTA for hx PE (LD 6/12) - was on enoxaparin  while warfarin on hold for procedure (LD 6/17).  Heparin  infusion for IABP restarted 2 hours after procedure 6/18. Impella placed 6/19 - restarted heparin  infusion at 500 units/hr.   Heparin  level came back subtherapeutic (<0.1), on 1000 units/hr. No issues with the infusion or bleeding reported  Goal of Therapy:  Heparin  level 0.3-0.5 units/ml Monitor platelets by anticoagulation protocol: Yes   Plan:  Increase heparin  IV to 1100 units/hr Daily heparin  level and CBC (monitor platelets) Monitor for signs/symptoms of bleeding  Thank you for allowing pharmacy to participate in this patient's care,  Rocky Slade, PharmD, BCPS Clinical Pharmacist  Phone:  [phone] 07/13/2023 10:12 PM  Please check AMION for all [facility] Pharmacy phone numbers After 10:00 PM, call Main Pharmacy [phone]

## 2023-07-13 NOTE — Plan of Care (Signed)

## 2023-07-14 ENCOUNTER — Inpatient Hospital Stay (HOSPITAL_COMMUNITY)

## 2023-07-14 DIAGNOSIS — I501 Left ventricular failure: Secondary | ICD-10-CM | POA: Diagnosis not present

## 2023-07-14 DIAGNOSIS — I340 Nonrheumatic mitral (valve) insufficiency: Secondary | ICD-10-CM | POA: Diagnosis not present

## 2023-07-14 DIAGNOSIS — R570 Cardiogenic shock: Secondary | ICD-10-CM | POA: Diagnosis not present

## 2023-07-14 LAB — COOXEMETRY PANEL
Carboxyhemoglobin: 2.3 % — ABNORMAL HIGH (ref 0.5–1.5)
Methemoglobin: <0.7 % (ref 0.0–1.5)
O2 Saturation: 65.5 %
Total hemoglobin: 10.3 g/dL — ABNORMAL LOW (ref 12.0–16.0)

## 2023-07-14 LAB — CBC
HCT: 33.3 % — ABNORMAL LOW (ref 36.0–46.0)
Hemoglobin: 9.9 g/dL — ABNORMAL LOW (ref 12.0–15.0)
MCH: 26.7 pg (ref 26.0–34.0)
MCHC: 29.7 g/dL — ABNORMAL LOW (ref 30.0–36.0)
MCV: 89.8 fL (ref 80.0–100.0)
Platelets: 81 10*3/uL — ABNORMAL LOW (ref 150–400)
RBC: 3.71 MIL/uL — ABNORMAL LOW (ref 3.87–5.11)
RDW: 21.2 % — ABNORMAL HIGH (ref 11.5–15.5)
WBC: 5.7 10*3/uL (ref 4.0–10.5)
nRBC: 0 % (ref 0.0–0.2)

## 2023-07-14 LAB — HEPARIN LEVEL (UNFRACTIONATED)
Heparin Unfractionated: <0.1 [IU]/mL — ABNORMAL LOW (ref 0.30–0.70)
Heparin Unfractionated: <0.1 [IU]/mL — ABNORMAL LOW (ref 0.30–0.70)

## 2023-07-14 LAB — BASIC METABOLIC PANEL WITH GFR
Anion gap: 9 (ref 5–15)
BUN: 17 mg/dL (ref 6–20)
CO2: 29 mmol/L (ref 22–32)
Calcium: 9 mg/dL (ref 8.9–10.3)
Chloride: 95 mmol/L — ABNORMAL LOW (ref 98–111)
Creatinine, Ser: 0.95 mg/dL (ref 0.44–1.00)
GFR, Estimated: >60 mL/min (ref >60)
Glucose, Bld: 121 mg/dL — ABNORMAL HIGH (ref 70–99)
Potassium: 3.6 mmol/L (ref 3.5–5.1)
Sodium: 133 mmol/L — ABNORMAL LOW (ref 135–145)

## 2023-07-14 LAB — ECHOCARDIOGRAM LIMITED
Height: 70 in
S' Lateral: 6 cm
Weight: 3929.48 [oz_av]

## 2023-07-14 LAB — GLUCOSE, CAPILLARY
Glucose-Capillary: 111 mg/dL — ABNORMAL HIGH (ref 70–99)
Glucose-Capillary: 124 mg/dL — ABNORMAL HIGH (ref 70–99)
Glucose-Capillary: 118 mg/dL — ABNORMAL HIGH (ref 70–99)
Glucose-Capillary: 129 mg/dL — ABNORMAL HIGH (ref 70–99)
Glucose-Capillary: 131 mg/dL — ABNORMAL HIGH (ref 70–99)
Glucose-Capillary: 77 mg/dL (ref 70–99)
Glucose-Capillary: 127 mg/dL — ABNORMAL HIGH (ref 70–99)

## 2023-07-14 LAB — MAGNESIUM: Magnesium: 2 mg/dL (ref 1.7–2.4)

## 2023-07-14 LAB — LACTATE DEHYDROGENASE: LDH: 345 U/L — ABNORMAL HIGH (ref 98–192)

## 2023-07-14 MED ORDER — SORBITOL 70 % SOLN
30.0000 mL | Freq: Once | Status: AC
  Administered 2023-07-14: 30 mL via ORAL
  Filled 2023-07-14: qty 30

## 2023-07-14 MED ORDER — POTASSIUM CHLORIDE CRYS ER 20 MEQ PO TBCR
40.0000 meq | EXTENDED_RELEASE_TABLET | Freq: Two times a day (BID) | ORAL | Status: AC
  Administered 2023-07-14 (×2): 40 meq via ORAL
  Filled 2023-07-14 (×2): qty 2

## 2023-07-14 MED ORDER — ACETAZOLAMIDE 250 MG PO TABS
250.0000 mg | ORAL_TABLET | Freq: Two times a day (BID) | ORAL | Status: DC
  Administered 2023-07-14: 250 mg via ORAL
  Filled 2023-07-14: qty 1

## 2023-07-14 MED ORDER — HYDRALAZINE HCL 10 MG PO TABS
10.0000 mg | ORAL_TABLET | Freq: Three times a day (TID) | ORAL | Status: DC
  Administered 2023-07-14 – 2023-07-24 (×28): 10 mg via ORAL
  Filled 2023-07-14 (×29): qty 1

## 2023-07-14 MED ORDER — ACETAZOLAMIDE 250 MG PO TABS
250.0000 mg | ORAL_TABLET | Freq: Once | ORAL | Status: AC
  Administered 2023-07-14: 250 mg via ORAL
  Filled 2023-07-14: qty 1

## 2023-07-14 MED ORDER — ACETAZOLAMIDE 250 MG PO TABS: 500.0000 mg | ORAL_TABLET | Freq: Two times a day (BID) | ORAL | Status: DC

## 2023-07-14 MED ORDER — ACETAZOLAMIDE 250 MG PO TABS
500.0000 mg | ORAL_TABLET | Freq: Two times a day (BID) | ORAL | Status: AC
  Administered 2023-07-14: 500 mg via ORAL
  Filled 2023-07-14: qty 2

## 2023-07-14 MED ORDER — FUROSEMIDE 10 MG/ML IJ SOLN
120.0000 mg | Freq: Once | INTRAVENOUS | Status: AC
  Administered 2023-07-14: 120 mg via INTRAVENOUS
  Filled 2023-07-14: qty 10

## 2023-07-14 MED ORDER — PANTOPRAZOLE SODIUM 40 MG PO TBEC
40.0000 mg | DELAYED_RELEASE_TABLET | Freq: Every day | ORAL | Status: DC
  Administered 2023-07-15 – 2023-07-23 (×9): 40 mg via ORAL
  Filled 2023-07-14 (×9): qty 1

## 2023-07-14 MED ORDER — POTASSIUM CHLORIDE CRYS ER 20 MEQ PO TBCR
40.0000 meq | EXTENDED_RELEASE_TABLET | Freq: Once | ORAL | Status: AC
  Administered 2023-07-14: 40 meq via ORAL
  Filled 2023-07-14: qty 2

## 2023-07-14 MED ORDER — SMOG ENEMA
960.0000 mL | Freq: Once | RECTAL | Status: DC
  Filled 2023-07-14: qty 960

## 2023-07-14 NOTE — Progress Notes (Signed)
 Advanced Heart Failure Rounding Note  Cardiologist: Ria Commander, DO  Chief Complaint: Cardiogenic Shock Subjective:    6/17: mTEER with sev posterior leaflet restriction resulting in tear. IABP + PAC. PCWP 40, v up to 60  Impella 5.5 P8 : Flow 4.8 CO-OX 65%.   Swan#s: SVR 1000 PAP: (56-93)/(30-67) 82/41 CVP:  [10 mmHg-23 mmHg] 18 mmHg CO:  [5.4 L/min-5.7 L/min] 5.4 L/min CI:  [2.38 L/min/m2-2.5 L/min/m2] 2.38 L/min/m2  Feels ok.   Objective:    Weight Range: 111.4 kg Body mass index is 35.24 kg/m.   Vital Signs:   Temp:  [97.3 F (36.3 C)-98.6 F (37 C)] 98.1 F (36.7 C) (06/23 0800) Pulse Rate:  [39-138] 78 (06/23 0800) Resp:  [13-32] 21 (06/23 0800) SpO2:  [84 %-100 %] 99 % (06/23 0800) Arterial Line BP: (75-123)/(63-99) 98/81 (06/23 0800) Last BM Date :  (PTA)  Weight change: Filed Weights   07/09/23 0636 07/10/23 0349  Weight: 110.2 kg 111.4 kg   Intake/Output:  Intake/Output Summary (Last 24 hours) at 07/14/2023 0823 Last data filed at 07/14/2023 0800 Gross per 24 hour  Intake 1218.59 ml  Output 1815 ml  Net -596.41 ml   CVP 11-12  Physical Exam   General:   No resp difficulty Neck: supple. JVP 11-12 . RIJ  Cor: PMI nondisplaced. Regular rate & rhythm. No rubs, gallops or murmurs. L axillary Impella  Lungs: clear Abdomen: soft, nontender, nondistended.  Extremities: no cyanosis, clubbing, rash, edema Neuro: alert & oriented x3  Telemetry  SR 80s   Labs    CBC Recent Labs    07/13/23 0402 07/14/23 0417  WBC 7.2 5.7  HGB 10.6* 9.9*  HCT 35.4* 33.3*  MCV 89.4 89.8  PLT 83* 81*   Basic Metabolic Panel Recent Labs    93/77/74 0402 07/14/23 0417  NA 135 133*  K 4.2 3.6  CL 97* 95*  CO2 28 29  GLUCOSE 102* 121*  BUN 17 17  CREATININE 0.92 0.95  CALCIUM  8.9 9.0  MG 1.9 2.0   Liver Function Tests Recent Labs    07/11/23 1129 07/13/23 0402  AST 26 27  ALT 9 11  ALKPHOS 68 76  BILITOT 1.9* 1.9*  PROT 7.0 7.1   ALBUMIN  2.9* 2.9*   BNP (last 3 results) Recent Labs    05/28/23 1224 06/30/23 1218 07/04/23 1057  BNP 1,155.3* 600.6* 612.0*   Fasting Lipid Panel No results for input(s): CHOL, HDL, LDLCALC, TRIG, CHOLHDL, LDLDIRECT in the last 72 hours.  Medications:    Scheduled Medications:  acetaminophen   500 mg Oral Q6H   Chlorhexidine  Gluconate Cloth  6 each Topical Daily   feeding supplement  237 mL Oral BID BM   insulin  aspart  0-9 Units Subcutaneous Q4H   multivitamin with minerals  1 tablet Oral Daily   pantoprazole  (PROTONIX ) IV  40 mg Intravenous Daily   polyethylene glycol  17 g Oral BID   senna-docusate  2 tablet Oral BID   spironolactone   25 mg Oral Daily   Infusions:  amiodarone  30 mg/hr (07/14/23 0802)   heparin  1,100 Units/hr (07/14/23 0800)   milrinone  0.375 mcg/kg/min (07/14/23 0800)   norepinephrine  (LEVOPHED ) Adult infusion Stopped (07/13/23 0919)   sodium bicarbonate  25 mEq (Impella PURGE) in dextrose  5 % 1000 mL bag     PRN Medications: acetaminophen , albuterol , ALPRAZolam , HYDROmorphone  (DILAUDID ) injection, ondansetron  (ZOFRAN ) IV, mouth rinse, oxyCODONE , oxyCODONE   Patient Profile   48 y.o. female with history of chronic systolic HF  with biventricular dysfunction on home inotrope, severe MR, hx PE/DVT, obesity, CAD, anemia, tobacco use. Presented for mTEER on 07/09/23. Worsening MR leading to cardiogenic shock.  Assessment/Plan   Cardiogenic shock: due to torrential MR s/p mTEER complicated by severely restricted posterior leaflet that was damaged during repair attempt, IABP and nipride  needed to stabilize. Transitioned to Impella 5.5. Consideration for LVAD workup on Monday, but she has significant social concerns. Not a txp candidate with tobacco use.  I do not believe that she would be able to survive without LVAD support, the alternative is comfort care.  -- Continue Impella 5.5 at P8, 4.8LPM - LDH slowly rising.  - Consider repeat echo with  adjustment for worsening - Stable occasional ectopy - Continue milrinone  to 0.375mcg/kg/min. CO-OX stable.  - Continue heparin  gtt to goal dose. Platelets 81   Acute on chronic systolic HF with biventricular dysfunction - stage D cardiomyopathy - nonobstructive CAD on cath 2023.  - RHC 5/25 on milrinone  0.25: severe pulmonary venous HTN, severely elevated PCWP with prominent v waves, preserved CO - Continue milrinone  0.375 - Volume overloaded. Give 120 mg IV lasix  x1 and diamox  500 mg twice a day. . - Give 10 mg hydralazine  three times a day. - needs stabilization and LVAD candidacy determination   Torrential MR - See above   Acute hypoxic respiratory failure - In setting of acute on chronic CHF and severe/torrential   MR - Improved with unloading, extubated 6/20   Hx of PE/DVT w/ IVC filter - Evaluated at [facility] where thrombophilia work-up was negative (Lupus inhibitor, anticardiolipin antibodies, and anti beta-2  glycoprotein antibodies were negative; protein C activity 85%, protein S activity 76%, factor V Leiden, and prothrombin gene mutation ) - IVC filter in place; could not be removed due to significant thrombus burden on filter at [facility]  - Heparin  gtt for now with Impella and potential procedures   CAD - LHC in 5/23 w/ nonobstructive CAD involving RCA - No aspirin  with need for anticoagulation.  Tobacco use - Smoking 3 cigarettes/day at time of last follow-up.  Anemia  Hgb drifting down. Follow CBC. Transfuse for HGB < 8. No obvious source.   VAD Coordinator starting VAD work up.   CXR today.  Length of Stay: 5  CRITICAL CARE Performed by: [name]   Total critical care time: 10 minutes  Critical care time was exclusive of separately billable procedures and treating other patients.  Critical care was necessary to treat or prevent imminent or life-threatening deterioration.  Critical care was time spent personally by me on the following activities: development  of treatment plan with patient and/or surrogate as well as nursing, discussions with consultants, evaluation of patient's response to treatment, examination of patient, obtaining history from patient or surrogate, ordering and performing treatments and interventions, ordering and review of laboratory studies, ordering and review of radiographic studies, pulse oximetry and re-evaluation of patient's condition.    [name], NP  07/14/2023, 8:23 AM  Advanced Heart Failure Team Pager [phone] (M-F; 7a - 5p)  Please contact [facility] for night-coverage after hours (5p -7a ) and weekends on [email or web address]

## 2023-07-14 NOTE — Evaluation (Signed)
 Occupational Therapy Evaluation Patient Details Name: [name] [name] MRN: [id number] DOB: November 11, 1975 Today's Date: 07/14/2023   History of Present Illness   48 yo presented 07/09/23 for transcatheter mitral valve repair with Mitraclip. Post-op severe residual MR and cardiogenic shock. 6/19 impella inserted; extubated 6/20; ?for LVAD workup  PMH-chronic HF (on home milrinone ); CAD, DVT, fibroid tumor, hidradenitis, cardiomyopathy, NSVT, PE, obesity     Clinical Impressions Patient admitted for the diagnosis above.  PTA she lives at home with her family, who assist with community mobility, but the patient remained independent with ADL and light iADL.  Currently she presents with the deficits below, needing up to Mod A for ADL completion and supervision for short bouts of mobility at RW level.  OT will continue efforts in the acute setting to address deficits, and Patient will benefit from intensive inpatient follow-up therapy, >3 hours/day.     If plan is discharge home, recommend the following:   A lot of help with walking and/or transfers;A lot of help with bathing/dressing/bathroom;Assist for transportation;Help with stairs or ramp for entrance     Functional Status Assessment   Patient has had a recent decline in their functional status and demonstrates the ability to make significant improvements in function in a reasonable and predictable amount of time.     Equipment Recommendations   None recommended by OT     Recommendations for Other Services         Precautions/Restrictions   Precautions Precautions: Fall;Other (comment) Precaution/Restrictions Comments: impella L shoulder access Restrictions Weight Bearing Restrictions Per Provider Order: Yes LUE Weight Bearing Per Provider Order: Non weight bearing     Mobility Bed Mobility Overal bed mobility: Needs Assistance Bed Mobility: Supine to Sit     Supine to sit: Supervision, HOB elevated       Patient  Response: Cooperative  Transfers Overall transfer level: Needs assistance Equipment used: Rolling walker (2 wheels) Transfers: Sit to/from Stand, Bed to chair/wheelchair/BSC Sit to Stand: Supervision     Step pivot transfers: Supervision            Balance Overall balance assessment: Needs assistance Sitting-balance support: Feet supported Sitting balance-Leahy Scale: Good     Standing balance support: Reliant on assistive device for balance Standing balance-Leahy Scale: Fair                             ADL either performed or assessed with clinical judgement   ADL Overall ADL's : Needs assistance/impaired Eating/Feeding: Set up;Sitting   Grooming: Wash/dry hands;Wash/dry face;Set up;Sitting   Upper Body Bathing: Minimal assistance;Sitting   Lower Body Bathing: Maximal assistance;Sit to/from stand   Upper Body Dressing : Moderate assistance;Sitting   Lower Body Dressing: Moderate assistance;Sit to/from stand   Toilet Transfer: Retail banker;Ambulation                   Vision Patient Visual Report: No change from baseline       Perception Perception: Not tested       Praxis Praxis: Not tested       Pertinent Vitals/Pain Pain Assessment Pain Assessment: No/denies pain Pain Intervention(s): Monitored during session     Extremity/Trunk Assessment Upper Extremity Assessment Upper Extremity Assessment: Overall WFL for tasks assessed   Lower Extremity Assessment Lower Extremity Assessment: Defer to PT evaluation   Cervical / Trunk Assessment Cervical / Trunk Assessment: Normal   Communication Communication Communication: No apparent difficulties   Cognition  Arousal: Alert Behavior During Therapy: WFL for tasks assessed/performed Cognition: No apparent impairments                               Following commands: Intact       Cueing  General Comments   Cueing Techniques: Verbal cues    VSS on O2   Exercises     Shoulder Instructions      Home Living Family/patient expects to be discharged to:: Private residence Living Arrangements: Parent;Children Available Help at Discharge: Family;Available PRN/intermittently Type of Home: Mobile home Home Access: Stairs to enter Entrance Stairs-Number of Steps: 3 Entrance Stairs-Rails: Right;Left;Can reach both Home Layout: One level     Bathroom Shower/Tub: Tub/shower unit;Walk-in shower   Bathroom Toilet: Standard Bathroom Accessibility: No   Home Equipment: None          Prior Functioning/Environment Prior Level of Function : Independent/Modified Independent             Mobility Comments: not working--on disability ADLs Comments: No assist with ADL or light iADL    OT Problem List: Decreased activity tolerance;Decreased strength   OT Treatment/Interventions: Self-care/ADL training;Therapeutic exercise;Therapeutic activities;Patient/family education;DME and/or AE instruction;Balance training      OT Goals(Current goals can be found in the care plan section)   Acute Rehab OT Goals Patient Stated Goal: Return home OT Goal Formulation: With patient Time For Goal Achievement: 07/28/23 Potential to Achieve Goals: Good ADL Goals Pt Will Perform Grooming: with modified independence;standing Pt Will Perform Lower Body Dressing: with supervision;sit to/from stand Pt Will Transfer to Toilet: with modified independence;regular height toilet;ambulating   OT Frequency:  Min 2X/week    Co-evaluation              AM-PAC OT 6 Clicks Daily Activity     Outcome Measure Help from another person eating meals?: None Help from another person taking care of personal grooming?: None Help from another person toileting, which includes using toliet, bedpan, or urinal?: A Lot Help from another person bathing (including washing, rinsing, drying)?: A Lot Help from another person to put on and taking off regular  upper body clothing?: A Lot Help from another person to put on and taking off regular lower body clothing?: A Lot 6 Click Score: 16   End of Session Equipment Utilized During Treatment: Rolling walker (2 wheels) Nurse Communication: Mobility status  Activity Tolerance: Patient tolerated treatment well Patient left: in chair;with call bell/phone within reach  OT Visit Diagnosis: Unsteadiness on feet (R26.81);Muscle weakness (generalized) (M62.81)                Time: 8888-8864 OT Time Calculation (min): 24 min Charges:  OT General Charges $OT Visit: 1 Visit OT Evaluation $OT Eval Moderate Complexity: 1 Mod OT Treatments $Self Care/Home Management : 8-22 mins  07/14/2023  RP, OTR/L  Acute Rehabilitation Services  Office:  [phone]   [name] [name] 07/14/2023, 11:45 AM

## 2023-07-14 NOTE — Plan of Care (Signed)

## 2023-07-14 NOTE — Progress Notes (Addendum)
 PHARMACY - ANTICOAGULATION CONSULT NOTE  Pharmacy Consult for heparin  Indication: IABP>impella 5.5  Allergies  Allergen Reactions   Sulfa  Antibiotics Itching, Swelling and Rash    Lip swelling    Patient Measurements: Height: 5' 10 (177.8 cm) Weight: 111.4 kg (245 lb 9.5 oz) IBW/kg (Calculated) : 68.5 HEPARIN  DW (KG): 93  Vital Signs: Temp: 98.1 F (36.7 C) (06/23 0700) Temp Source: Core (06/23 0400) Pulse Rate: 83 (06/23 0700)  Labs: Recent Labs    07/12/23 0447 07/12/23 0843 07/12/23 1421 07/12/23 1448 07/13/23 0402 07/13/23 1315 07/13/23 2056 07/14/23 0417  HGB 10.7*  --   --   --  10.6*  --   --  9.9*  HCT 35.7*  --   --   --  35.4*  --   --  33.3*  PLT 94*  --   --   --  83*  --   --  81*  HEPARINUNFRC  --    < >  --    < > <0.10* <0.10* <0.10* <0.10*  CREATININE 0.94  --  0.89  --  0.92  --   --  0.95   < > = values in this interval not displayed.    Estimated Creatinine Clearance: 100.1 mL/min (by C-G formula based on SCr of 0.95 mg/dL).  Assessment: 77 yof who presented for mTEER complicated with torn mitral valve leaflet resulting in torrential MR and cardiogenic shock requiring IABP. On warfarin PTA for hx PE (LD 6/12) - was on enoxaparin  while warfarin on hold for procedure (LD 6/17).  Heparin  infusion for IABP restarted 2 hours after procedure 6/18. Impella placed 6/19 - restarted heparin  infusion at 500 units/hr.   Heparin  level came back subtherapeutic (<0.1), with heparin  infusion at 1100 units/hr. Hgb 9.9. Plt 81 low stable. No issues with the infusion or bleeding or signs of bleeding reported.  Goal of Therapy:  Heparin  level 0.3-0.5 units/ml Monitor platelets by anticoagulation protocol: Yes   Plan:  Increase heparin  infusion to 1200 units/hr 6hr heparin  level Daily heparin  level and CBC (monitor platelets) Monitor for signs/symptoms of bleeding  Thank you for allowing pharmacy to participate in this patient's care,  Joesph Specking,  PharmD PGY1 Pharmacy Resident 07/14/2023 7:32 AM

## 2023-07-14 NOTE — Progress Notes (Signed)
  Echocardiogram 2D Echocardiogram has been performed.  Devora Ellouise SAUNDERS 07/14/2023, 10:34 AM

## 2023-07-14 NOTE — Progress Notes (Signed)
 PHARMACY - ANTICOAGULATION CONSULT NOTE  Pharmacy Consult for heparin  Indication: IABP>impella 5.5  Allergies  Allergen Reactions   Sulfa  Antibiotics Itching, Swelling and Rash    Lip swelling    Patient Measurements: Height: 5' 10 (177.8 cm) Weight: 111.4 kg (245 lb 9.5 oz) IBW/kg (Calculated) : 68.5 HEPARIN  DW (KG): 93  Vital Signs: Temp: 98.2 F (36.8 C) (06/23 1900) Temp Source: Core (06/23 0800) BP: 107/73 (06/23 1817) Pulse Rate: 75 (06/23 1817)  Labs: Recent Labs    07/12/23 0447 07/12/23 0843 07/12/23 1421 07/12/23 1448 07/13/23 0402 07/13/23 1315 07/13/23 2056 07/14/23 0417 07/14/23 1742  HGB 10.7*  --   --   --  10.6*  --   --  9.9*  --   HCT 35.7*  --   --   --  35.4*  --   --  33.3*  --   PLT 94*  --   --   --  83*  --   --  81*  --   HEPARINUNFRC  --    < >  --    < > <0.10*   < > <0.10* <0.10* <0.10*  CREATININE 0.94  --  0.89  --  0.92  --   --  0.95  --    < > = values in this interval not displayed.    Estimated Creatinine Clearance: 100.1 mL/min (by C-G formula based on SCr of 0.95 mg/dL).  Assessment: 25 yof who presented for mTEER complicated with torn mitral valve leaflet resulting in torrential MR and cardiogenic shock requiring IABP. On warfarin PTA for hx PE (LD 6/12) - was on enoxaparin  while warfarin on hold for procedure (LD 6/17).  Heparin  infusion for IABP restarted 2 hours after procedure 6/18. Impella placed 6/19 - restarted heparin  infusion at 500 units/hr.   Heparin  level came back subtherapeutic (<0.1), with heparin  infusion at 1200 units/hr. Hgb 9.9. Plt 81 low stable. No s/sx of bleeding or infusion issues. Impella still running at P2 without issues.   Goal of Therapy:  Heparin  level 0.3-0.5 units/ml Monitor platelets by anticoagulation protocol: Yes   Plan:  Increase heparin  infusion to 1300 units/hr 6hr heparin  level Daily heparin  level and CBC (monitor platelets) Monitor for signs/symptoms of bleeding  Thank you  for allowing pharmacy to participate in this patient's care,  Suzen Sour, PharmD, BCCCP Clinical Pharmacist  Phone: [phone] 07/14/2023 7:08 PM  Please check AMION for all [facility] phone numbers After 10:00 PM, call Main Pharmacy [phone]

## 2023-07-15 ENCOUNTER — Encounter

## 2023-07-15 DIAGNOSIS — R570 Cardiogenic shock: Secondary | ICD-10-CM | POA: Diagnosis not present

## 2023-07-15 DIAGNOSIS — I340 Nonrheumatic mitral (valve) insufficiency: Secondary | ICD-10-CM | POA: Diagnosis not present

## 2023-07-15 LAB — GLUCOSE, CAPILLARY
Glucose-Capillary: 99 mg/dL (ref 70–99)
Glucose-Capillary: 106 mg/dL — ABNORMAL HIGH (ref 70–99)
Glucose-Capillary: 138 mg/dL — ABNORMAL HIGH (ref 70–99)
Glucose-Capillary: 143 mg/dL — ABNORMAL HIGH (ref 70–99)
Glucose-Capillary: 93 mg/dL (ref 70–99)

## 2023-07-15 LAB — BASIC METABOLIC PANEL WITH GFR
Anion gap: 10 (ref 5–15)
Anion gap: 9 (ref 5–15)
BUN: 17 mg/dL (ref 6–20)
BUN: 19 mg/dL (ref 6–20)
CO2: 30 mmol/L (ref 22–32)
CO2: 31 mmol/L (ref 22–32)
Calcium: 8.8 mg/dL — ABNORMAL LOW (ref 8.9–10.3)
Calcium: 9.2 mg/dL (ref 8.9–10.3)
Chloride: 95 mmol/L — ABNORMAL LOW (ref 98–111)
Chloride: 92 mmol/L — ABNORMAL LOW (ref 98–111)
Creatinine, Ser: 0.96 mg/dL (ref 0.44–1.00)
Creatinine, Ser: 1.07 mg/dL — ABNORMAL HIGH (ref 0.44–1.00)
GFR, Estimated: >60 mL/min (ref >60)
GFR, Estimated: >60 mL/min (ref >60)
Glucose, Bld: 105 mg/dL — ABNORMAL HIGH (ref 70–99)
Glucose, Bld: 102 mg/dL — ABNORMAL HIGH (ref 70–99)
Potassium: 3.7 mmol/L (ref 3.5–5.1)
Potassium: 3.7 mmol/L (ref 3.5–5.1)
Sodium: 135 mmol/L (ref 135–145)
Sodium: 132 mmol/L — ABNORMAL LOW (ref 135–145)

## 2023-07-15 LAB — HEPARIN LEVEL (UNFRACTIONATED)
Heparin Unfractionated: <0.1 [IU]/mL — ABNORMAL LOW (ref 0.30–0.70)
Heparin Unfractionated: <0.1 [IU]/mL — ABNORMAL LOW (ref 0.30–0.70)
Heparin Unfractionated: 0.16 [IU]/mL — ABNORMAL LOW (ref 0.30–0.70)

## 2023-07-15 LAB — CBC
HCT: 32.4 % — ABNORMAL LOW (ref 36.0–46.0)
Hemoglobin: 9.7 g/dL — ABNORMAL LOW (ref 12.0–15.0)
MCH: 26.7 pg (ref 26.0–34.0)
MCHC: 29.9 g/dL — ABNORMAL LOW (ref 30.0–36.0)
MCV: 89.3 fL (ref 80.0–100.0)
Platelets: 76 10*3/uL — ABNORMAL LOW (ref 150–400)
RBC: 3.63 MIL/uL — ABNORMAL LOW (ref 3.87–5.11)
RDW: 21.4 % — ABNORMAL HIGH (ref 11.5–15.5)
WBC: 5.6 10*3/uL (ref 4.0–10.5)
nRBC: 0 % (ref 0.0–0.2)

## 2023-07-15 LAB — COOXEMETRY PANEL
Carboxyhemoglobin: 1.6 % — ABNORMAL HIGH (ref 0.5–1.5)
Methemoglobin: <0.7 % (ref 0.0–1.5)
O2 Saturation: 68.4 %
Total hemoglobin: 10 g/dL — ABNORMAL LOW (ref 12.0–16.0)

## 2023-07-15 LAB — MAGNESIUM: Magnesium: 1.9 mg/dL (ref 1.7–2.4)

## 2023-07-15 LAB — LACTATE DEHYDROGENASE: LDH: 381 U/L — ABNORMAL HIGH (ref 98–192)

## 2023-07-15 MED ORDER — POTASSIUM CHLORIDE CRYS ER 20 MEQ PO TBCR
60.0000 meq | EXTENDED_RELEASE_TABLET | Freq: Four times a day (QID) | ORAL | Status: AC
  Administered 2023-07-15 (×2): 60 meq via ORAL
  Filled 2023-07-15 (×2): qty 3

## 2023-07-15 MED ORDER — ACETAZOLAMIDE 250 MG PO TABS
500.0000 mg | ORAL_TABLET | Freq: Two times a day (BID) | ORAL | Status: AC
  Administered 2023-07-15 (×2): 500 mg via ORAL
  Filled 2023-07-15 (×2): qty 2

## 2023-07-15 MED ORDER — POTASSIUM CHLORIDE CRYS ER 20 MEQ PO TBCR: 60.0000 meq | EXTENDED_RELEASE_TABLET | Freq: Once | ORAL | Status: DC

## 2023-07-15 MED ORDER — FUROSEMIDE 10 MG/ML IJ SOLN
10.0000 mg/h | INTRAVENOUS | Status: DC
  Administered 2023-07-15 – 2023-07-16 (×3): 20 mg/h via INTRAVENOUS
  Administered 2023-07-16 – 2023-07-17 (×2): 10 mg/h via INTRAVENOUS
  Filled 2023-07-15 (×5): qty 20

## 2023-07-15 MED ORDER — POTASSIUM CHLORIDE CRYS ER 20 MEQ PO TBCR
60.0000 meq | EXTENDED_RELEASE_TABLET | Freq: Once | ORAL | Status: AC
  Administered 2023-07-15: 60 meq via ORAL
  Filled 2023-07-15: qty 3

## 2023-07-15 MED ORDER — ENSURE PLUS HIGH PROTEIN PO LIQD
237.0000 mL | Freq: Three times a day (TID) | ORAL | Status: DC
  Administered 2023-07-15 – 2023-07-23 (×11): 237 mL via ORAL

## 2023-07-15 MED ORDER — METOLAZONE 5 MG PO TABS
5.0000 mg | ORAL_TABLET | Freq: Once | ORAL | Status: AC
  Administered 2023-07-15: 5 mg via ORAL
  Filled 2023-07-15: qty 1

## 2023-07-15 MED ORDER — MAGNESIUM SULFATE 2 GM/50ML IV SOLN
2.0000 g | Freq: Once | INTRAVENOUS | Status: AC
  Administered 2023-07-15: 2 g via INTRAVENOUS
  Filled 2023-07-15: qty 50

## 2023-07-15 MED ORDER — FUROSEMIDE 10 MG/ML IJ SOLN
120.0000 mg | Freq: Once | INTRAVENOUS | Status: AC
  Administered 2023-07-15: 120 mg via INTRAVENOUS
  Filled 2023-07-15: qty 12

## 2023-07-15 NOTE — Progress Notes (Addendum)
 Physical Therapy Treatment Patient Details Name: [name] [name] MRN: [id number] DOB: May 24, 1975 Today's Date: 07/15/2023   History of Present Illness 48 yo presented 07/09/23 for transcatheter mitral valve repair with Mitraclip. Post-op severe residual MR and cardiogenic shock. 6/19 impella inserted; extubated 6/20; ?for LVAD workup  PMH-chronic HF (on home milrinone ); CAD, DVT, fibroid tumor, hidradenitis, cardiomyopathy, NSVT, PE, obesity    PT Comments  Patient initially hesitant to work with therapy due to feeling she overdid it yesterday. Fairly easily persuaded to work on standing, and ultimately she decided she wanted to walk in the hall. Brief LE exercises once in chair egress position for warm-up. Transfers with min assist (not using UEs for support) and ambulates 350 ft with RW and CGA and assist of 2 nurses (impella and swan).  Impella p=9   If plan is discharge home, recommend the following: Assistance with cooking/housework;Assist for transportation;Help with stairs or ramp for entrance;A little help with walking and/or transfers;A little help with bathing/dressing/bathroom   Can travel by private Psychologist, clinical (4 wheels);BSC/3in1    Recommendations for Other Services       Precautions / Restrictions Precautions Precautions: Fall;Other (comment) Precaution/Restrictions Comments: impella L shoulder access     Mobility  Bed Mobility Overal bed mobility: Needs Assistance Bed Mobility: Supine to Sit           General bed mobility comments: pt in chair egress position on arrival    Transfers Overall transfer level: Needs assistance Equipment used: Rolling walker (2 wheels) Transfers: Sit to/from Stand Sit to Stand: Min assist           General transfer comment: RN did not want her pushing with either UE (Swan on R; impella on L); required a slight boost to come to stand    Ambulation/Gait Ambulation/Gait assistance:  Contact guard assist, +2 safety/equipment (+3 (PT, 2 RNs)) Gait Distance (Feet): 350 Feet Assistive device: Rolling walker (2 wheels) Gait Pattern/deviations: Step-through pattern, Decreased stride length           Stairs             Wheelchair Mobility     Tilt Bed    Modified Rankin (Stroke Patients Only)       Balance Overall balance assessment: Needs assistance Sitting-balance support: Feet supported Sitting balance-Leahy Scale: Good     Standing balance support: No upper extremity supported, During functional activity Standing balance-Leahy Scale: Fair                              Hotel manager: No apparent difficulties  Cognition Arousal: Alert Behavior During Therapy: WFL for tasks assessed/performed                             Following commands: Intact      Cueing Cueing Techniques: Verbal cues  Exercises General Exercises - Lower Extremity Ankle Circles/Pumps: AROM, Both, 15 reps Long Arc Quad: AROM, Both, 10 reps    General Comments General comments (skin integrity, edema, etc.): on 6L O2, sats not registering while holding RW; no dyspnea;      Pertinent Vitals/Pain Pain Assessment Pain Assessment: Faces Faces Pain Scale: Hurts a little bit Pain Location: left shoulder Pain Descriptors / Indicators: Discomfort, Grimacing, Guarding Pain Intervention(s): Limited activity within patient's tolerance, Monitored during session    Home Living  Prior Function            PT Goals (current goals can now be found in the care plan section) Acute Rehab PT Goals Patient Stated Goal: to get better Time For Goal Achievement: 07/26/23 Potential to Achieve Goals: Fair Progress towards PT goals: Progressing toward goals    Frequency    Min 2X/week      PT Plan      Co-evaluation              AM-PAC PT 6 Clicks Mobility   Outcome Measure   Help needed turning from your back to your side while in a flat bed without using bedrails?: A Little Help needed moving from lying on your back to sitting on the side of a flat bed without using bedrails?: A Little Help needed moving to and from a bed to a chair (including a wheelchair)?: A Little Help needed standing up from a chair using your arms (e.g., wheelchair or bedside chair)?: A Little Help needed to walk in hospital room?: A Little Help needed climbing 3-5 steps with a railing? : A Lot 6 Click Score: 17    End of Session Equipment Utilized During Treatment: Oxygen Activity Tolerance: Patient tolerated treatment well Patient left: in chair;with call bell/phone within reach;with nursing/sitter in room Nurse Communication: Mobility status PT Visit Diagnosis: Unsteadiness on feet (R26.81);Muscle weakness (generalized) (M62.81);Difficulty in walking, not elsewhere classified (R26.2)     Time: 8697-8654 PT Time Calculation (min) (ACUTE ONLY): 43 min  Charges:    $Gait Training: 38-52 mins PT General Charges $$ ACUTE PT VISIT: 1 Visit                      Macario RAMAN, PT Acute Rehabilitation Services  Office [phone]    Macario SHAUNNA Soja 07/15/2023, 2:51 PM

## 2023-07-15 NOTE — Progress Notes (Signed)
 PHARMACY - ANTICOAGULATION CONSULT NOTE  Pharmacy Consult for heparin  Indication: IABP>impella 5.5  Allergies  Allergen Reactions   Sulfa  Antibiotics Itching, Swelling and Rash    Lip swelling    Patient Measurements: Height: 5' 10 (177.8 cm) Weight: 111.4 kg (245 lb 9.5 oz) IBW/kg (Calculated) : 68.5 HEPARIN  DW (KG): 93  Vital Signs: Temp: 97.5 F (36.4 C) (06/24 0700) Temp Source: Core (06/24 0400) BP: 91/82 (06/24 0102) Pulse Rate: 81 (06/24 0645)  Labs: Recent Labs    07/13/23 0402 07/13/23 1315 07/14/23 0417 07/14/23 1742 07/15/23 0130 07/15/23 0335 07/15/23 0800  HGB 10.6*  --  9.9*  --   --  9.7*  --   HCT 35.4*  --  33.3*  --   --  32.4*  --   PLT 83*  --  81*  --   --  76*  --   HEPARINUNFRC <0.10*   < > <0.10* <0.10* <0.10*  --  <0.10*  CREATININE 0.92  --  0.95  --   --  0.96  --    < > = values in this interval not displayed.    Estimated Creatinine Clearance: 99.1 mL/min (by C-G formula based on SCr of 0.96 mg/dL).  Assessment: 80 yof who presented for mTEER complicated with torn mitral valve leaflet resulting in torrential MR and cardiogenic shock requiring IABP. On warfarin PTA for hx PE (LD 6/12) - was on enoxaparin  while warfarin on hold for procedure (LD 6/17).  Heparin  infusion for IABP restarted 2 hours after procedure 6/18. Impella placed 6/19 - restarted heparin  infusion at 500 units/hr.   Heparin  level subtherapeutic (<0.1), with heparin  infusion at 1450 units/hr.  Hgb 9.7, pltc 76 - slowly trending down.  No s/sx of bleeding or infusion issues per RN.  Remains on P9.  Goal of Therapy:  Heparin  level 0.3-0.5 units/ml Monitor platelets by anticoagulation protocol: Yes   Plan:  Increase heparin  infusion to 1550 units/hr 6hr heparin  level Daily heparin  level and CBC (monitor platelets) Monitor for signs/symptoms of bleeding  Thank you for allowing pharmacy to participate in this patient's care,  Maurilio Fila, PharmD Clinical  Pharmacist 07/15/2023  9:36 AM

## 2023-07-15 NOTE — Progress Notes (Signed)
 PHARMACY - ANTICOAGULATION CONSULT NOTE  Pharmacy Consult for heparin  Indication: IABP>impella 5.5  Allergies  Allergen Reactions   Sulfa  Antibiotics Itching, Swelling and Rash    Lip swelling    Patient Measurements: Height: 5' 10 (177.8 cm) Weight: 111.4 kg (245 lb 9.5 oz) IBW/kg (Calculated) : 68.5 HEPARIN  DW (KG): 93  Vital Signs: Temp: 98.2 F (36.8 C) (06/24 1545) Temp Source: Core (06/24 1200) Pulse Rate: 155 (06/24 1545)  Labs: Recent Labs    07/13/23 0402 07/13/23 1315 07/14/23 0417 07/14/23 1742 07/15/23 0130 07/15/23 0335 07/15/23 0800 07/15/23 1641  HGB 10.6*  --  9.9*  --   --  9.7*  --   --   HCT 35.4*  --  33.3*  --   --  32.4*  --   --   PLT 83*  --  81*  --   --  76*  --   --   HEPARINUNFRC <0.10*   < > <0.10*   < > <0.10*  --  <0.10* 0.16*  CREATININE 0.92  --  0.95  --   --  0.96  --   --    < > = values in this interval not displayed.    Estimated Creatinine Clearance: 99.1 mL/min (by C-G formula based on SCr of 0.96 mg/dL).  Assessment: 47 yof who presented for mTEER complicated with torn mitral valve leaflet resulting in torrential MR and cardiogenic shock requiring IABP. On warfarin PTA for hx PE (LD 6/12) - was on enoxaparin  while warfarin on hold for procedure (LD 6/17).  Heparin  infusion for IABP restarted 2 hours after procedure 6/18. Impella placed 6/19 - restarted heparin  infusion at 500 units/hr.   Heparin  level is subtherapeutic (0.16) but detectable, with heparin  infusion at 1550 units/hr.  Hgb 9.7, pltc 76 - slowly trending down.  No s/sx of bleeding or infusion issues per RN.  Remains on P9.  Goal of Therapy:  Heparin  level 0.3-0.5 units/ml Monitor platelets by anticoagulation protocol: Yes   Plan:  Increase heparin  infusion to 1650 units/hr 6hr heparin  level Daily heparin  level and CBC (monitor platelets) Monitor for signs/symptoms of bleeding  Thank you for allowing pharmacy to participate in this patient's  care,  Suzen Sour, PharmD, BCCCP Clinical Pharmacist  Phone: [phone] 07/15/2023 5:33 PM  Please check AMION for all [facility] Pharmacy phone numbers After 10:00 PM, call Main Pharmacy [phone]

## 2023-07-15 NOTE — Progress Notes (Signed)

## 2023-07-15 NOTE — Progress Notes (Signed)
 Nutrition Follow-up  DOCUMENTATION CODES:   Obesity unspecified  INTERVENTION:   Liberalize diet to REGULAR (no salt packets allowed)  Increase Ensure Plus High Protein po to TID, each supplement provides 350 kcal and 20 grams of protein. Pt ONLY likes CHOCOLATE  Request new weight, standing ideally. Discussed with RN  NUTRITION DIAGNOSIS:   Increased nutrient needs related to acute illness as evidenced by estimated needs.  Being addressed via supplements, liberalized diet  GOAL:   Patient will meet greater than or equal to 90% of their needs  Not Met but being addressed  MONITOR:   PO intake, Diet advancement, Labs, Weight trends  REASON FOR ASSESSMENT:   Consult Enteral/tube feeding initiation and management  ASSESSMENT:   Pt presented for mitral valve clip procedure. Procedure c/b severe residual MR and cardiogenic shock.  PMH significant for nonischemic cardiomyopathy, chronic biventricular HFrEF, obesity hypoventilation syndrome  6/18: s/p mTEER procedure with severe residual MR, severe posterior leaflet restriction result in tear, cardiogenic shock, IABP placement 6/19: OR for impella 5.5 placement 6/20: extubated  Pt remains on milrinone  and amiodarone  gtt Impella 5.5 at P9  Noted per Advanced HF team, pt does not appear to be LVAD candidate with RV dysfunction in addition to psychosocial hurdles  Appetites is poor. Pt ate bites of pancakes this AM. Upon visit, pt sleeping but able to arouse. Pt had not touched lunch tray. Pt reports she will drink the Ensure but only likes Chocolate; pt later requested RN bring Ensure with cup of ice. Pt reports the food is bland, not appealing. RN later [employer] and indicated pt drank all of the Ensure and ate most of her lunch  Pt reports taste changes and early satiety. Occasionally has nausea.   UOP 2.6 L but net +500 mL in 24 hours Started on lasix  drip today, also received 120 mg IV lasix  x 1. Also receiving  aldactone    Pt with large type 6 BM yesterday after period of constipation since admission. Pt reports she normally does not have a BM daily but usually ever other day. Pt remains on bowel regimen  No new weight since 6/19. Plan to request new weight, standing weight would be ideal if feasible.    Labs: CBGs 99-138 Sodium 135 (wdl) Potassium 2.7 (wdl) BUN/Creatinine wdl   Meds:  SS novolog  MVI with Minerals Miralax  Senna-docusate KCl   Diet Order:   Diet Order             Diet regular Room service appropriate? Yes with Assist; Fluid consistency: Thin  Diet effective now                   EDUCATION NEEDS:   No education needs have been identified at this time  Skin:  Skin Assessment: Reviewed RN Assessment (closed surgical incision to L chest)  Last BM:  6/23  Height:   Ht Readings from Last 1 Encounters:  07/09/23 5' 10 (1.778 m)    Weight:   Wt Readings from Last 1 Encounters:  07/10/23 111.4 kg    Ideal Body Weight:  68.2 kg  BMI:  Body mass index is 35.24 kg/m.  Estimated Nutritional Needs:   Kcal:  1900-2100  Protein:  100-115g  Fluid:  >/=1.9L  Betsey Finger MS, RDN, LDN, CNSC Registered Dietitian 3 Clinical Nutrition RD Inpatient Contact Info in Amion

## 2023-07-15 NOTE — Progress Notes (Signed)
   07/15/23 2329  BiPAP/CPAP/SIPAP  Reason BIPAP/CPAP not in use Other(comment) (patient refused for the night .)  BiPAP/CPAP /SiPAP Vitals  Temp 98.2 F (36.8 C)  Pulse Rate 81  Resp 19  SpO2 98 %  Bilateral Breath Sounds Clear;Diminished  MEWS Score/Color  MEWS Score 0  MEWS Score Color Green

## 2023-07-15 NOTE — Progress Notes (Signed)
 PHARMACY - ANTICOAGULATION CONSULT NOTE  Pharmacy Consult for heparin  Indication: IABP>impella 5.5  Allergies  Allergen Reactions   Sulfa  Antibiotics Itching, Swelling and Rash    Lip swelling    Patient Measurements: Height: 5' 10 (177.8 cm) Weight: 111.4 kg (245 lb 9.5 oz) IBW/kg (Calculated) : 68.5 HEPARIN  DW (KG): 93  Vital Signs: Temp: 97.9 F (36.6 C) (06/24 0200) Temp Source: Core (06/23 2330) BP: 91/82 (06/24 0102) Pulse Rate: 78 (06/24 0200)  Labs: Recent Labs    07/12/23 0447 07/12/23 0843 07/12/23 1421 07/12/23 1448 07/13/23 0402 07/13/23 1315 07/14/23 0417 07/14/23 1742 07/15/23 0130  HGB 10.7*  --   --   --  10.6*  --  9.9*  --   --   HCT 35.7*  --   --   --  35.4*  --  33.3*  --   --   PLT 94*  --   --   --  83*  --  81*  --   --   HEPARINUNFRC  --    < >  --    < > <0.10*   < > <0.10* <0.10* <0.10*  CREATININE 0.94  --  0.89  --  0.92  --  0.95  --   --    < > = values in this interval not displayed.    Estimated Creatinine Clearance: 100.1 mL/min (by C-G formula based on SCr of 0.95 mg/dL).  Assessment: 54 yof who presented for mTEER complicated with torn mitral valve leaflet resulting in torrential MR and cardiogenic shock requiring IABP. On warfarin PTA for hx PE (LD 6/12) - was on enoxaparin  while warfarin on hold for procedure (LD 6/17).  Heparin  infusion for IABP restarted 2 hours after procedure 6/18. Impella placed 6/19 - restarted heparin  infusion at 500 units/hr.   Heparin  level subtherapeutic (<0.1), with heparin  infusion at 1300 units/hr. Last Hgb 9.9. Plt 81 low stable. No s/sx of bleeding or infusion issues per RN.  Goal of Therapy:  Heparin  level 0.3-0.5 units/ml Monitor platelets by anticoagulation protocol: Yes   Plan:  Increase heparin  infusion to 1450 units/hr 6hr heparin  level Daily heparin  level and CBC (monitor platelets) Monitor for signs/symptoms of bleeding  Thank you for allowing pharmacy to participate in this  patient's care,  Lynwood Poplar, PharmD, BCPS Clinical Pharmacist 07/15/2023 2:04 AM

## 2023-07-15 NOTE — Progress Notes (Signed)
 Advanced Heart Failure Rounding Note  Cardiologist: Ria Commander, DO  Chief Complaint: Cardiogenic Shock Subjective:    6/17: mTEER with sev posterior leaflet restriction resulting in tear. IABP + PAC. PCWP 40, v up to 60  Impella 5.5 P98 : Flow 4.9 CO-OX 68%  Remains on milrinone  0.375 mcg.   Swan#s: PAP: (64-98)/(25-48) 94/46 CVP:  [3 mmHg-31 mmHg] 13 mmHg CO:  [7.1 L/min-7.9 L/min] 7.9 L/min CI:  [3.14 L/min/m2-3.5 L/min/m2] 3.5 L/min/m2  Complaining of fatigue.   Objective:    Weight Range: 111.4 kg Body mass index is 35.24 kg/m.   Vital Signs:   Temp:  [97 F (36.1 C)-98.8 F (37.1 C)] 97.5 F (36.4 C) (06/24 0700) Pulse Rate:  [71-181] 81 (06/24 0645) Resp:  [13-30] 15 (06/24 0700) BP: (91-107)/(73-82) 91/82 (06/24 0102) SpO2:  [64 %-100 %] 91 % (06/24 0645) Arterial Line BP: (78-123)/(65-103) 98/79 (06/24 0700) Last BM Date : 07/14/23  Weight change: Filed Weights   07/09/23 0636 07/10/23 0349  Weight: 110.2 kg 111.4 kg   Intake/Output:  Intake/Output Summary (Last 24 hours) at 07/15/2023 0828 Last data filed at 07/15/2023 0800 Gross per 24 hour  Intake 2990.31 ml  Output 2548 ml  Net 442.31 ml   CVP 11-12  Physical Exam   General:  Appears weak.  No resp difficulty Neck: supple. JVP 11-12 . RIJ  Cor: PMI nondisplaced. Regular rate & rhythm. No rubs, gallops or murmurs. L axillary Impella  Lungs: clear on 7 liters [place].  Abdomen: soft, nontender, nondistended.  Extremities: no cyanosis, clubbing, rash, edema Neuro: alert & oriented x3  Telemetry  SR 80s   Labs    CBC Recent Labs    07/14/23 0417 07/15/23 0335  WBC 5.7 5.6  HGB 9.9* 9.7*  HCT 33.3* 32.4*  MCV 89.8 89.3  PLT 81* 76*   Basic Metabolic Panel Recent Labs    93/76/74 0417 07/15/23 0335  NA 133* 135  K 3.6 3.7  CL 95* 95*  CO2 29 30  GLUCOSE 121* 105*  BUN 17 17  CREATININE 0.95 0.96  CALCIUM  9.0 8.8*  MG 2.0 1.9   Liver Function Tests Recent Labs     07/13/23 0402  AST 27  ALT 11  ALKPHOS 76  BILITOT 1.9*  PROT 7.1  ALBUMIN  2.9*   BNP (last 3 results) Recent Labs    05/28/23 1224 06/30/23 1218 07/04/23 1057  BNP 1,155.3* 600.6* 612.0*   Fasting Lipid Panel No results for input(s): CHOL, HDL, LDLCALC, TRIG, CHOLHDL, LDLDIRECT in the last 72 hours.  Medications:    Scheduled Medications:  acetaminophen   500 mg Oral Q6H   Chlorhexidine  Gluconate Cloth  6 each Topical Daily   feeding supplement  237 mL Oral BID BM   hydrALAZINE   10 mg Oral Q8H   insulin  aspart  0-9 Units Subcutaneous Q4H   multivitamin with minerals  1 tablet Oral Daily   pantoprazole   40 mg Oral Daily   polyethylene glycol  17 g Oral BID   potassium chloride   60 mEq Oral Once   senna-docusate  2 tablet Oral BID   spironolactone   25 mg Oral Daily   Infusions:  amiodarone  30 mg/hr (07/15/23 0800)   heparin  1,450 Units/hr (07/15/23 0800)   magnesium  sulfate bolus IVPB     milrinone  0.375 mcg/kg/min (07/15/23 0800)   sodium bicarbonate  25 mEq (Impella PURGE) in dextrose  5 % 1000 mL bag     PRN Medications: acetaminophen , albuterol , ALPRAZolam , HYDROmorphone  (DILAUDID )  injection, ondansetron  (ZOFRAN ) IV, mouth rinse, oxyCODONE , oxyCODONE   Patient Profile   48 y.o. female with history of chronic systolic HF with biventricular dysfunction on home inotrope, severe MR, hx PE/DVT, obesity, CAD, anemia, tobacco use. Presented for mTEER on 07/09/23. Worsening MR leading to cardiogenic shock.  Assessment/Plan   Cardiogenic shock: due to torrential MR s/p mTEER complicated by severely restricted posterior leaflet that was damaged during repair attempt, IABP and nipride  needed to stabilize. Transitioned to Impella 5.5. Consideration for LVAD workup on Monday, but she has significant social concerns. Not a txp candidate with tobacco use.  I do not believe that she would be able to survive without LVAD support, the alternative is comfort care.  --  Continue Impella 5.5 at P9  4.9. - LDH slowly rising.  -- Continue milrinone  to 0.375mcg/kg/min. CO-OX stable.  - Continue heparin  gtt to goal dose. Platelets 76   Acute on chronic systolic HF with biventricular dysfunction - stage D cardiomyopathy - nonobstructive CAD on cath 2023.  - RHC 5/25 on milrinone  0.25: severe pulmonary venous HTN, severely elevated PCWP with prominent v waves, preserved CO - Continue milrinone  0.375 mcg.  - CVP 12-13 Give 120 mg IV lasix  + metolazone  5 mg + Diamox  500 mg twice a day. After lasix  bolus will add lasix  drip 20 mg per hour.  - Give 10 mg hydralazine  three times a day. - needs stabilization and LVAD candidacy determination   Torrential MR - See above   Acute hypoxic respiratory failure - In setting of acute on chronic CHF and severe/torrential  - On 7 liters [place]. Sats stable. Add incentive spirometer.   MR - Improved with unloading, extubated 6/20   Hx of PE/DVT w/ IVC filter - Evaluated at [facility] where thrombophilia work-up was negative (Lupus inhibitor, anticardiolipin antibodies, and anti beta-2  glycoprotein antibodies were negative; protein C activity 85%, protein S activity 76%, factor V Leiden, and prothrombin gene mutation ) - IVC filter in place; could not be removed due to significant thrombus burden on filter at [facility]  - Heparin  gtt for now with Impella and potential procedures   CAD - LHC in 5/23 w/ nonobstructive CAD involving RCA - No aspirin  with need for anticoagulation.  Tobacco use - Smoking 3 cigarettes/day at time of last follow-up.  Anemia  Hgb drifting down to 9.7.  Follow CBC. Transfuse for HGB < 8. No obvious source.   VAD Coordinator starting VAD work up.    Length of Stay: 6  CRITICAL CARE Performed by: [name] NP-C    Total critical care time: 15 minutes  Critical care time was exclusive of separately billable procedures and treating other patients.  Critical care was necessary to treat or prevent  imminent or life-threatening deterioration.  Critical care was time spent personally by me on the following activities: development of treatment plan with patient and/or surrogate as well as nursing, discussions with consultants, evaluation of patient's response to treatment, examination of patient, obtaining history from patient or surrogate, ordering and performing treatments and interventions, ordering and review of laboratory studies, ordering and review of radiographic studies, pulse oximetry and re-evaluation of patient's condition.    [name], NP  07/15/2023, 8:28 AM  Advanced Heart Failure Team Pager [phone] (M-F; 7a - 5p)  Please contact [facility] for night-coverage after hours (5p -7a ) and weekends on [email or web address]

## 2023-07-16 ENCOUNTER — Inpatient Hospital Stay (HOSPITAL_COMMUNITY)

## 2023-07-16 DIAGNOSIS — I5023 Acute on chronic systolic (congestive) heart failure: Secondary | ICD-10-CM

## 2023-07-16 DIAGNOSIS — R570 Cardiogenic shock: Secondary | ICD-10-CM | POA: Diagnosis not present

## 2023-07-16 DIAGNOSIS — Z95811 Presence of heart assist device: Secondary | ICD-10-CM

## 2023-07-16 DIAGNOSIS — I340 Nonrheumatic mitral (valve) insufficiency: Secondary | ICD-10-CM | POA: Diagnosis not present

## 2023-07-16 LAB — BASIC METABOLIC PANEL WITH GFR
Anion gap: 6 (ref 5–15)
BUN: 21 mg/dL — ABNORMAL HIGH (ref 6–20)
CO2: 34 mmol/L — ABNORMAL HIGH (ref 22–32)
Calcium: 8.5 mg/dL — ABNORMAL LOW (ref 8.9–10.3)
Chloride: 95 mmol/L — ABNORMAL LOW (ref 98–111)
Creatinine, Ser: 1 mg/dL (ref 0.44–1.00)
GFR, Estimated: >60 mL/min (ref >60)
Glucose, Bld: 95 mg/dL (ref 70–99)
Potassium: 3.3 mmol/L — ABNORMAL LOW (ref 3.5–5.1)
Sodium: 135 mmol/L (ref 135–145)

## 2023-07-16 LAB — CBC
HCT: 35 % — ABNORMAL LOW (ref 36.0–46.0)
Hemoglobin: 10.5 g/dL — ABNORMAL LOW (ref 12.0–15.0)
MCH: 26.5 pg (ref 26.0–34.0)
MCHC: 30 g/dL (ref 30.0–36.0)
MCV: 88.4 fL (ref 80.0–100.0)
Platelets: 88 10*3/uL — ABNORMAL LOW (ref 150–400)
RBC: 3.96 MIL/uL (ref 3.87–5.11)
RDW: 21.2 % — ABNORMAL HIGH (ref 11.5–15.5)
WBC: 6.3 10*3/uL (ref 4.0–10.5)
nRBC: 0 % (ref 0.0–0.2)

## 2023-07-16 LAB — URINALYSIS, ROUTINE W REFLEX MICROSCOPIC
Bilirubin Urine: NEGATIVE
Glucose, UA: NEGATIVE mg/dL
Hgb urine dipstick: NEGATIVE
Ketones, ur: NEGATIVE mg/dL
Leukocytes,Ua: NEGATIVE
Nitrite: NEGATIVE
Protein, ur: NEGATIVE mg/dL
Specific Gravity, Urine: 1.008 (ref 1.005–1.030)
pH: 5 (ref 5.0–8.0)

## 2023-07-16 LAB — HEPARIN LEVEL (UNFRACTIONATED)
Heparin Unfractionated: 0.13 [IU]/mL — ABNORMAL LOW (ref 0.30–0.70)
Heparin Unfractionated: 0.1 [IU]/mL — ABNORMAL LOW (ref 0.30–0.70)
Heparin Unfractionated: 0.21 [IU]/mL — ABNORMAL LOW (ref 0.30–0.70)
Heparin Unfractionated: 0.28 [IU]/mL — ABNORMAL LOW (ref 0.30–0.70)

## 2023-07-16 LAB — COOXEMETRY PANEL
Carboxyhemoglobin: 2.2 % — ABNORMAL HIGH (ref 0.5–1.5)
Methemoglobin: <0.7 % (ref 0.0–1.5)
O2 Saturation: 62.4 %
Total hemoglobin: 10.1 g/dL — ABNORMAL LOW (ref 12.0–16.0)

## 2023-07-16 LAB — GLUCOSE, CAPILLARY
Glucose-Capillary: 106 mg/dL — ABNORMAL HIGH (ref 70–99)
Glucose-Capillary: 113 mg/dL — ABNORMAL HIGH (ref 70–99)
Glucose-Capillary: 138 mg/dL — ABNORMAL HIGH (ref 70–99)
Glucose-Capillary: 149 mg/dL — ABNORMAL HIGH (ref 70–99)
Glucose-Capillary: 154 mg/dL — ABNORMAL HIGH (ref 70–99)
Glucose-Capillary: 83 mg/dL (ref 70–99)

## 2023-07-16 LAB — PREALBUMIN: Prealbumin: 11 mg/dL — ABNORMAL LOW (ref 18–38)

## 2023-07-16 LAB — LACTATE DEHYDROGENASE: LDH: 544 U/L — ABNORMAL HIGH (ref 98–192)

## 2023-07-16 LAB — ECHOCARDIOGRAM LIMITED
Height: 70 in
S' Lateral: 6.1 cm
Weight: 3929.48 [oz_av]

## 2023-07-16 LAB — MAGNESIUM: Magnesium: 1.8 mg/dL (ref 1.7–2.4)

## 2023-07-16 MED ORDER — POTASSIUM CHLORIDE CRYS ER 20 MEQ PO TBCR
40.0000 meq | EXTENDED_RELEASE_TABLET | Freq: Once | ORAL | Status: AC
  Administered 2023-07-16: 40 meq via ORAL
  Filled 2023-07-16: qty 2

## 2023-07-16 MED ORDER — MAGNESIUM SULFATE 2 GM/50ML IV SOLN
2.0000 g | Freq: Once | INTRAVENOUS | Status: AC
  Administered 2023-07-16: 2 g via INTRAVENOUS
  Filled 2023-07-16: qty 50

## 2023-07-16 NOTE — Progress Notes (Addendum)
 CSW attempted to contact patients mother [name] [name]) and aunt [name] [name]) to schedule time to complete LVAD assessment. CSW left VM for [name] [name] [phone]). Will call again at a later time.   1:29 PM [name] [name] called CSW back and stated that she may not be able to get to the hospital as she does not have transportation. CSW asked if [name], may be able to be present on Monday, she stated yes. CSW reached out to [place]. [name] stated it is unclear if patient wants LVAD. She is agreeable to meeting CSW on Monday 6/30 around 11AM - 12PM.  CSW notified Isaiah, VAD coordinator.   Edy Mcbane, MSW, LCSWA Transitions of Care [phone]

## 2023-07-16 NOTE — Progress Notes (Signed)
 PHARMACY - ANTICOAGULATION CONSULT NOTE  Pharmacy Consult for heparin  Indication: IABP>impella 5.5  Allergies  Allergen Reactions   Sulfa  Antibiotics Itching, Swelling and Rash    Lip swelling    Patient Measurements: Height: 5' 10 (177.8 cm) Weight: 111.4 kg (245 lb 9.5 oz) IBW/kg (Calculated) : 68.5 HEPARIN  DW (KG): 93  Vital Signs: Temp: 97.5 F (36.4 C) (06/25 0800) Temp Source: Core (06/25 0800) BP: 104/85 (06/25 0925) Pulse Rate: 82 (06/25 0800)  Labs: Recent Labs    07/14/23 0417 07/14/23 1742 07/15/23 0335 07/15/23 0800 07/15/23 1641 07/16/23 0149 07/16/23 0342 07/16/23 0835  HGB 9.9*  --  9.7*  --   --   --   --   --   HCT 33.3*  --  32.4*  --   --   --   --   --   PLT 81*  --  76*  --   --   --   --   --   HEPARINUNFRC <0.10*   < >  --    < > 0.16* 0.13*  --  0.10*  CREATININE 0.95  --  0.96  --  1.07*  --  1.00  --    < > = values in this interval not displayed.    Estimated Creatinine Clearance: 95.1 mL/min (by C-G formula based on SCr of 1 mg/dL).  Assessment: 21 yof who presented for mTEER complicated with torn mitral valve leaflet resulting in torrential MR and cardiogenic shock requiring IABP. On warfarin PTA for hx PE (LD 6/12) - was on enoxaparin  while warfarin on hold for procedure (LD 6/17).  Heparin  infusion for IABP restarted 2 hours after procedure 6/18. Impella placed 6/19 - restarted heparin  infusion at 500 units/hr.   6/25 AM: Heparin  level 0.1, subtherapeutic on heparin  infusion at 1800 units/hr. No issues with infusion running or signs of bleeding per RN. Hgb 9.7, PLT trending down to 76. Remains on P9.   Goal of Therapy:  Heparin  level 0.3-0.5 units/ml Monitor platelets by anticoagulation protocol: Yes   Plan:  Increase heparin  infusion to 2000 units/hr 6hr heparin  level Daily heparin  level and CBC (monitor platelets) Monitor for signs/symptoms of bleeding  Thank you for allowing pharmacy to participate in this patient's  care,  Morna Breach, PharmD PGY-1 Acute Care Pharmacy Resident 07/16/2023 9:49 AM  Please check AMION for all [facility] Pharmacy phone numbers After 10:00 PM, call Main Pharmacy [phone]

## 2023-07-16 NOTE — Progress Notes (Signed)
 PHARMACY - ANTICOAGULATION CONSULT NOTE  Pharmacy Consult for heparin  Indication: IABP>impella 5.5  Allergies  Allergen Reactions   Sulfa  Antibiotics Itching, Swelling and Rash    Lip swelling    Patient Measurements: Height: 5' 10 (177.8 cm) Weight: 111.4 kg (245 lb 9.5 oz) IBW/kg (Calculated) : 68.5 HEPARIN  DW (KG): 93  Vital Signs: Temp: 98.1 F (36.7 C) (06/25 1630) Temp Source: Core (06/25 1600) BP: 97/83 (06/25 1630) Pulse Rate: 66 (06/25 1630)  Labs: Recent Labs    07/14/23 0417 07/14/23 1742 07/15/23 0335 07/15/23 0800 07/15/23 1641 07/16/23 0149 07/16/23 0342 07/16/23 0835 07/16/23 1000 07/16/23 1548  HGB 9.9*  --  9.7*  --   --   --   --   --  10.5*  --   HCT 33.3*  --  32.4*  --   --   --   --   --  35.0*  --   PLT 81*  --  76*  --   --   --   --   --  88*  --   HEPARINUNFRC <0.10*   < >  --    < > 0.16* 0.13*  --  0.10*  --  0.21*  CREATININE 0.95  --  0.96  --  1.07*  --  1.00  --   --   --    < > = values in this interval not displayed.    Estimated Creatinine Clearance: 95.1 mL/min (by C-G formula based on SCr of 1 mg/dL).  Assessment: 74 yof who presented for mTEER complicated with torn mitral valve leaflet resulting in torrential MR and cardiogenic shock requiring IABP. On warfarin PTA for hx PE (LD 6/12) - was on enoxaparin  while warfarin on hold for procedure (LD 6/17).  Heparin  infusion for IABP restarted 2 hours after procedure 6/18. Impella placed 6/19 - restarted heparin  infusion at 500 units/hr.   6/25 AM: Heparin  level 0.1, subtherapeutic on heparin  infusion at 1800 units/hr. No issues with infusion running or signs of bleeding per RN. Hgb 9.7, PLT trending down to 76. Remains on P9.   6/25 PM: heparin  level remains subtherapeutic at 0.21 on 2000 units/hr. No issues with the infusion or bleeding reported.  Goal of Therapy:  Heparin  level 0.3-0.5 units/ml Monitor platelets by anticoagulation protocol: Yes   Plan:  Increase heparin   infusion to 2200 units/hr 6hr heparin  level Daily heparin  level and CBC (monitor platelets) Monitor for signs/symptoms of bleeding  Thank you for allowing pharmacy to participate in this patient's care,  Rocky Slade, PharmD, BCPS 07/16/2023 4:51 PM  Please check AMION for all [facility] Pharmacy phone numbers After 10:00 PM, call Main Pharmacy [phone]

## 2023-07-16 NOTE — Procedures (Signed)
 Due to rising LDH, impella position evaluated by TTE.   TTE notable for impella to be deep seated > 5.5cm from the aortic valve annulus and directed towards the mitral valve apparatus and previously placed mitraclip. The impella was pulled back and advanced with clockwise motion under ultrasound guidance. There were no complications.   [name] [name] 6:20 PM

## 2023-07-16 NOTE — Progress Notes (Signed)
 Advanced Heart Failure Rounding Note  Cardiologist: Ria Commander, DO  Chief Complaint: Cardiogenic Shock Subjective:    6/17: mTEER with sev posterior leaflet restriction resulting in tear. IABP + PAC. PCWP 40, v up to 60 6/24 : Aggressive diuresed. Started on lasix  drip. Brisk diuresis noted.   Impella 5.5 P9 : Flow 5.3  CO-OX 62$   Remains on milrinone  0.375 mcg.   CVP in the chair -->5  Swan#s: PAP: (64-97)/(18-47) 77/25 CVP:  [1 mmHg-23 mmHg] 12 mmHg CO:  [5.2 L/min-6.3 L/min] 6.1 L/min CI:  [2.3 L/min/m2-2.8 L/min/m2] 2.7 L/min/m2  Complaining of a headache.   Objective:    Weight Range: 111.4 kg Body mass index is 35.24 kg/m.   Vital Signs:   Temp:  [97 F (36.1 C)-98.8 F (37.1 C)] 97.5 F (36.4 C) (06/25 0800) Pulse Rate:  [74-177] 82 (06/25 0800) Resp:  [11-28] 20 (06/25 0800) BP: (85-126)/(64-96) 103/86 (06/25 0800) SpO2:  [68 %-100 %] 97 % (06/25 0800) Arterial Line BP: (84-117)/(64-94) 97/94 (06/24 1715) Last BM Date : 07/14/23  Weight change: Filed Weights   07/09/23 0636 07/10/23 0349  Weight: 110.2 kg 111.4 kg   Intake/Output:  Intake/Output Summary (Last 24 hours) at 07/16/2023 0925 Last data filed at 07/16/2023 0800 Gross per 24 hour  Intake 2369.18 ml  Output 89204 ml  Net -8425.82 ml   CVP 5 Physical Exam   General:   No resp difficulty. In the chair.  Neck: supple. no JVD.  Cor: PMI nondisplaced. Regular rate & rhythm. No rubs, gallops or murmurs. + Impella 5.5  Lungs: clear Abdomen: soft, nontender, nondistended.  Extremities: no cyanosis, clubbing, rash, edema Neuro: alert & oriented x3  Telemetry  SR 80s   Labs    CBC Recent Labs    07/14/23 0417 07/15/23 0335  WBC 5.7 5.6  HGB 9.9* 9.7*  HCT 33.3* 32.4*  MCV 89.8 89.3  PLT 81* 76*   Basic Metabolic Panel Recent Labs    93/75/74 0335 07/15/23 1641 07/16/23 0342  NA 135 132* 135  K 3.7 3.7 3.3*  CL 95* 92* 95*  CO2 30 31 34*  GLUCOSE 105* 102* 95   BUN 17 19 21*  CREATININE 0.96 1.07* 1.00  CALCIUM  8.8* 9.2 8.5*  MG 1.9  --  1.8   Liver Function Tests No results for input(s): AST, ALT, ALKPHOS, BILITOT, PROT, ALBUMIN  in the last 72 hours.  BNP (last 3 results) Recent Labs    05/28/23 1224 06/30/23 1218 07/04/23 1057  BNP 1,155.3* 600.6* 612.0*   Fasting Lipid Panel No results for input(s): CHOL, HDL, LDLCALC, TRIG, CHOLHDL, LDLDIRECT in the last 72 hours.  Medications:    Scheduled Medications:  acetaminophen   500 mg Oral Q6H   Chlorhexidine  Gluconate Cloth  6 each Topical Daily   feeding supplement  237 mL Oral TID BM   hydrALAZINE   10 mg Oral Q8H   insulin  aspart  0-9 Units Subcutaneous Q4H   multivitamin with minerals  1 tablet Oral Daily   pantoprazole   40 mg Oral Daily   polyethylene glycol  17 g Oral BID   potassium chloride   40 mEq Oral Once   senna-docusate  2 tablet Oral BID   spironolactone   25 mg Oral Daily   Infusions:  amiodarone  30 mg/hr (07/16/23 0900)   furosemide  (LASIX ) 200 mg in dextrose  5 % 100 mL (2 mg/mL) infusion 20 mg/hr (07/16/23 0800)   heparin  1,800 Units/hr (07/16/23 0800)   milrinone  0.375 mcg/kg/min (  07/16/23 0854)   sodium bicarbonate  25 mEq (Impella PURGE) in dextrose  5 % 1000 mL bag     PRN Medications: acetaminophen , albuterol , ALPRAZolam , HYDROmorphone  (DILAUDID ) injection, ondansetron  (ZOFRAN ) IV, mouth rinse, oxyCODONE , oxyCODONE   Patient Profile   48 y.o. female with history of chronic systolic HF with biventricular dysfunction on home inotrope, severe MR, hx PE/DVT, obesity, CAD, anemia, tobacco use. Presented for mTEER on 07/09/23. Worsening MR leading to cardiogenic shock.  Assessment/Plan   Cardiogenic shock: due to torrential MR s/p mTEER complicated by severely restricted posterior leaflet that was damaged during repair attempt, IABP and nipride  needed to stabilize. Transitioned to Impella 5.5. Consideration for LVAD workup on Monday, but she  has significant social concerns. Not a txp candidate with tobacco use.  I do not believe that she would be able to survive without LVAD support, the alternative is comfort care.  -- Continue Impella 5.5 at P9  5.3 . CXR now.  - Check LDH. Check CBC  -- Continue milrinone  to 0.375mcg/kg/min. CO-OX 62%  - Continue heparin  gtt to goal dose.  -Check Swan numbers when back in bed.   Acute on chronic systolic HF with biventricular dysfunction - stage D cardiomyopathy - nonobstructive CAD on cath 2023.  - RHC 5/25 on milrinone  0.25: severe pulmonary venous HTN, severely elevated PCWP with prominent v waves, preserved CO - Continue milrinone  0.375 mcg.  - CVP 5. Cut back lasix  drip to 10 mg per hour.  - Continue 10 mg hydralazine  three times a day. - needs stabilization and LVAD candidacy determination   Torrential MR - See above -Improved with unloading, extubated 6/20   Acute hypoxic respiratory failure - In setting of acute on chronic CHF and severe/torrential  - On 5 liters [place]. Sats stable. Add incentive spirometer.   Hx of PE/DVT w/ IVC filter - Evaluated at [facility] where thrombophilia work-up was negative (Lupus inhibitor, anticardiolipin antibodies, and anti beta-2  glycoprotein antibodies were negative; protein C activity 85%, protein S activity 76%, factor V Leiden, and prothrombin gene mutation ) - IVC filter in place; could not be removed due to significant thrombus burden on filter at [facility]  - Heparin  gtt for now with Impella and potential procedures   CAD - LHC in 5/23 w/ nonobstructive CAD involving RCA - No aspirin  with need for anticoagulation.  Tobacco use - Smoking 3 cigarettes/day at time of last follow-up.  Anemia  Check CBC.  Transfuse for HGB < 8. No obvious source.   Plan to start VAD work up. VAD coordinator will meet with her today. Palliative Care consulted. Check prealbumin.   Length of Stay: 7  CRITICAL CARE Performed by: [name] NP-C    Total critical  care time: 15 minutes  Critical care time was exclusive of separately billable procedures and treating other patients.  Critical care was necessary to treat or prevent imminent or life-threatening deterioration.  Critical care was time spent personally by me on the following activities: development of treatment plan with patient and/or surrogate as well as nursing, discussions with consultants, evaluation of patient's response to treatment, examination of patient, obtaining history from patient or surrogate, ordering and performing treatments and interventions, ordering and review of laboratory studies, ordering and review of radiographic studies, pulse oximetry and re-evaluation of patient's condition.    [name], NP  07/16/2023, 9:25 AM  Advanced Heart Failure Team Pager [phone] (M-F; 7a - 5p)  Please contact [facility] for night-coverage after hours (5p -7a ) and weekends on [email or web address]

## 2023-07-16 NOTE — Progress Notes (Signed)
  Echocardiogram 2D Echocardiogram has been performed.  Koleen KANDICE Popper, RDCS 07/16/2023, 12:36 PM

## 2023-07-16 NOTE — Progress Notes (Signed)
   07/16/23 2245  BiPAP/CPAP/SIPAP  BiPAP/CPAP/SIPAP Pt Type Adult  BiPAP/CPAP/SIPAP Resmed  Reason BIPAP/CPAP not in use Non-compliant  BiPAP/CPAP /SiPAP Vitals  Temp 99 F (37.2 C)  Pulse Rate 82  Resp 20  BP 99/76  SpO2 98 %  Bilateral Breath Sounds Clear;Diminished  MEWS Score/Color  MEWS Score 1  MEWS Score Color Green

## 2023-07-16 NOTE — Progress Notes (Signed)
 MCS EDUCATION NOTE:                VAD evaluation consent reviewed and signed by [name].  Initial VAD teaching completed with pt.   VAD educational packet including Understanding Your Options with Advanced Heart Failure, [place] Patient Agreement for VAD Evaluation and Potential Implantation consent, and Abbott Heartmate 3 Left Ventricular Device (LVAD) Patient Guide, [place] HM III Patient Education, [place] Mechanical Circulatory Support Program, and Decision Aids for Left Ventricular Assist Device reviewed in detail and left at bedside for continued reference.   All questions answered regarding VAD implant, hospital stay, and what to expect when discharged home living with a heart pump. Pt identified [name] [name] (aunt) as her primary caregiver and [name] (mom) as back-up if this therapy should be deemed appropriate for [facility].  Explained need for 24/7 care when pt is discharged home due to sternal precautions, adaptation to living on support, emotional support, consistent and meticulous exit site care and management, medication adherence and high volume of follow up visits with the VAD Clinic after discharge; pt verbalized understanding of above.   Explained that LVAD can be implanted for two indications in the setting of advanced left ventricular heart failure treatment:  Bridge to transplant - used for patients who cannot safely wait for heart transplant without this device.  Or    Destination therapy - used for patients until end of life or recovery of heart function.  Patient and caregiver(s) acknowledge that the indication at this point in time for LVAD therapy would be for destination therapy due to smoking.   Provided brief equipment overview and demonstration with HeartMate III training loop including discussion on the following:   a) mobile power unit b) system controller   c) universal Magazine features editor   d) battery clips   e) Batteries   f)   Perc lock   g) Percutaneous lead    Reviewed and supplied a copy of home inspection check list stressing that only three pronged grounded power outlets can be used for VAD equipment. Marleah confirmed home has electrical outlets that will support the equipment along with access working telephone.  Identified the following lifestyle modifications while living on MCS:    1. No driving for at least three months and then only if doctor gives permission to do so.   2. No tub baths while pump implanted, and shower only when doctor gives permission.   3. No swimming or submersion in water while implanted with pump.   4. No contact sports or engaging in jumping activities.   5. Always have a backup controller, charged spare batteries, and battery clips nearby at all times in case of emergency.   6. Call the doctor or hospital contact person if any change in how the pump sounds, feels, or works.   7. Plan to sleep only when connected to the power module.   8. Do not sleep on your stomach.   9. Keep a backup system controller, charged batteries, battery clips, and flashlight near you during sleep in case of electrical power outage.   10. Exit site care including dressing changes, monitoring for infection, and importance of keeping percutaneous lead stabilized at all times.     Extended the option to have one of our current patients and caregiver(s) come to talk with them about living on support to assist with decision making.   Reviewed pictures of VAD drive line, site care, dressing changes, and drive line  stabilization including securement attachment device and abdominal binder. Discussed with pt and family that they will be required to purchase dressing supplies as long as patient has the VAD in place.   Reinforced need for 24 hour/7 day week caregivers; pt designated [place] and [place] as caregivers. She will also need to abide by sternal precautions with no lifting >10lbs, pushing, pulling and  will need assistance with adapting to new life style with VAD equipment and care.   Intermacs patient survival statistics through December 2024 reviewed with patient and caregiver as follows:    The patient understands that from this discussion it does not mean that they will receive the device, but that depends on an extensive evaluation process. The patient is aware of the fact that if at anytime they want to stop the evaluation process they can.  All questions have been answered at this time and contact information was provided should they encounter any further questions.  She is agreeable at this time to the evaluation process and will move forward.   Discussed with Dr Gardenia. Will await medical readiness to order needed testing. Ok to have social evaluation completed next week (Monday).    Total Session Time: 45 minutes  [name] RN VAD Coordinator  Office: [phone]  24/7 Pager: [phone]

## 2023-07-16 NOTE — Progress Notes (Signed)
 Physical Therapy Treatment Patient Details Name: [name] [name] MRN: [id number] DOB: 01/07/1976 Today's Date: 07/16/2023   History of Present Illness 48 yo presented 07/09/23 for transcatheter mitral valve repair with Mitraclip. Post-op severe residual MR and cardiogenic shock. 6/19 impella inserted; extubated 6/20; ?for LVAD workup  PMH-chronic HF (on home milrinone ); CAD, DVT, fibroid tumor, hidradenitis, cardiomyopathy, NSVT, PE, obesity    PT Comments  Pt admitted with above diagnosis. Pt needed assist to transfer to bed from recliner due to soft BP and weakness bilateral LEs. Nurse present and aware of BP and didn't want pt to walk given low BP.  Pt did march in place and overall min assist of 2.  Will continue to follow and progress pt as able.  Pt currently with functional limitations due to the deficits listed below (see PT Problem List). Pt will benefit from acute skilled PT to increase their independence and safety with mobility to allow discharge.       If plan is discharge home, recommend the following: Assistance with cooking/housework;Assist for transportation;Help with stairs or ramp for entrance;A little help with walking and/or transfers;A little help with bathing/dressing/bathroom   Can travel by private Psychologist, clinical (4 wheels);BSC/3in1    Recommendations for Other Services       Precautions / Restrictions Precautions Precautions: Fall;Other (comment) Precaution/Restrictions Comments: impella L shoulder access Restrictions Weight Bearing Restrictions Per Provider Order: Yes LUE Weight Bearing Per Provider Order: Non weight bearing     Mobility  Bed Mobility               General bed mobility comments: pt in recliner on arrival    Transfers Overall transfer level: Needs assistance Equipment used: Rolling walker (2 wheels) Transfers: Sit to/from Stand Sit to Stand: Min assist   Step pivot transfers: Supervision        General transfer comment: required a slight boost to come to stand.  Marched in place to determine if pt was steady on feet. RN suggested to check BP and then determine if pt could progress ambulation with Elyn walker. BP sitting 94/81 and once she stood, 57/26 therefore transferred to bed per nurse.  HR 75-84bpm    Ambulation/Gait Ambulation/Gait assistance: Contact guard assist, +2 safety/equipment (+3 (PT, 2 RNs))   Assistive device: Elyn Finder Gait Pattern/deviations: Step-through pattern, Decreased stride length       General Gait Details: unable at this time   Optometrist     Tilt Bed    Modified Rankin (Stroke Patients Only)       Balance Overall balance assessment: Needs assistance Sitting-balance support: Feet supported, No upper extremity supported Sitting balance-Leahy Scale: Good     Standing balance support: During functional activity, Bilateral upper extremity supported Standing balance-Leahy Scale: Poor Standing balance comment: relie son UE support                            Communication Communication Communication: No apparent difficulties Factors Affecting Communication: Reduced clarity of speech  Cognition Arousal: Alert Behavior During Therapy: WFL for tasks assessed/performed   PT - Cognitive impairments: Difficult to assess                         Following commands: Intact      Cueing Cueing Techniques:  Verbal cues  Exercises General Exercises - Lower Extremity Ankle Circles/Pumps: AROM, Both, 15 reps Long Arc Quad: AROM, Both, 10 reps Hip Flexion/Marching: AROM, 20 reps, Standing    General Comments General comments (skin integrity, edema, etc.): on 6LO2 with O2 sats stable      Pertinent Vitals/Pain Pain Assessment Pain Assessment: No/denies pain    Home Living                          Prior Function            PT Goals (current goals can now be found  in the care plan section) Acute Rehab PT Goals Patient Stated Goal: to get better Progress towards PT goals: Progressing toward goals    Frequency    Min 2X/week      PT Plan      Co-evaluation              AM-PAC PT 6 Clicks Mobility   Outcome Measure  Help needed turning from your back to your side while in a flat bed without using bedrails?: A Little Help needed moving from lying on your back to sitting on the side of a flat bed without using bedrails?: A Little Help needed moving to and from a bed to a chair (including a wheelchair)?: A Little Help needed standing up from a chair using your arms (e.g., wheelchair or bedside chair)?: A Little Help needed to walk in hospital room?: A Little Help needed climbing 3-5 steps with a railing? : A Lot 6 Click Score: 17    End of Session Equipment Utilized During Treatment: Oxygen;Gait belt Activity Tolerance: Patient limited by fatigue;Treatment limited secondary to medical complications (Comment) (limited by BP) Patient left: with nursing/sitter in room;in bed;with call bell/phone within reach Nurse Communication: Mobility status PT Visit Diagnosis: Unsteadiness on feet (R26.81);Muscle weakness (generalized) (M62.81);Difficulty in walking, not elsewhere classified (R26.2)     Time: 8874-8850 PT Time Calculation (min) (ACUTE ONLY): 24 min  Charges:    $Gait Training: 8-22 mins $Therapeutic Exercise: 8-22 mins PT General Charges $$ ACUTE PT VISIT: 1 Visit                     Findlay Dagher M,PT Acute Rehab Services [phone]    Stephane JULIANNA Bevel 07/16/2023, 2:19 PM

## 2023-07-16 NOTE — Progress Notes (Signed)
   Inpatient Rehabilitation Admissions Coordinator   Noted LVAD workup in progress. I will follow at a distance to assist with dispo when appropriate.  Heron Leavell, RN, MSN Rehab Admissions Coordinator [phone] 07/16/2023 12:08 PM

## 2023-07-16 NOTE — Progress Notes (Signed)
 PHARMACY - ANTICOAGULATION CONSULT NOTE  Pharmacy Consult for heparin  Indication: IABP>Impella  Labs: Recent Labs    07/13/23 0402 07/13/23 1315 07/14/23 0417 07/14/23 1742 07/15/23 0335 07/15/23 0800 07/15/23 1641 07/16/23 0149  HGB 10.6*  --  9.9*  --  9.7*  --   --   --   HCT 35.4*  --  33.3*  --  32.4*  --   --   --   PLT 83*  --  81*  --  76*  --   --   --   HEPARINUNFRC <0.10*   < > <0.10*   < >  --  <0.10* 0.16* 0.13*  CREATININE 0.92  --  0.95  --  0.96  --  1.07*  --    < > = values in this interval not displayed.   Assessment: 48yo female remains subtherapeutic on heparin  as expected with slow titration; no infusion issues or signs of bleeding per RN.  Goal of Therapy:  Heparin  level 0.3-0.5 units/ml   Plan:  Increase heparin  infusion slightly to 1800 units/hr. Check level in 6h.   Marvetta Dauphin, PharmD, BCPS 07/16/2023 2:53 AM

## 2023-07-17 ENCOUNTER — Inpatient Hospital Stay (HOSPITAL_COMMUNITY)

## 2023-07-17 DIAGNOSIS — I5020 Unspecified systolic (congestive) heart failure: Secondary | ICD-10-CM | POA: Diagnosis not present

## 2023-07-17 DIAGNOSIS — I340 Nonrheumatic mitral (valve) insufficiency: Secondary | ICD-10-CM | POA: Diagnosis not present

## 2023-07-17 DIAGNOSIS — Z7189 Other specified counseling: Secondary | ICD-10-CM | POA: Diagnosis not present

## 2023-07-17 DIAGNOSIS — R570 Cardiogenic shock: Secondary | ICD-10-CM | POA: Diagnosis not present

## 2023-07-17 DIAGNOSIS — Z515 Encounter for palliative care: Secondary | ICD-10-CM

## 2023-07-17 LAB — RENAL FUNCTION PANEL
Albumin: 2.7 g/dL — ABNORMAL LOW (ref 3.5–5.0)
Anion gap: 9 (ref 5–15)
BUN: 19 mg/dL (ref 6–20)
CO2: 36 mmol/L — ABNORMAL HIGH (ref 22–32)
Calcium: 8.9 mg/dL (ref 8.9–10.3)
Chloride: 87 mmol/L — ABNORMAL LOW (ref 98–111)
Creatinine, Ser: 1.06 mg/dL — ABNORMAL HIGH (ref 0.44–1.00)
GFR, Estimated: >60 mL/min (ref >60)
Glucose, Bld: 87 mg/dL (ref 70–99)
Phosphorus: 3.6 mg/dL (ref 2.5–4.6)
Potassium: 3.5 mmol/L (ref 3.5–5.1)
Sodium: 132 mmol/L — ABNORMAL LOW (ref 135–145)

## 2023-07-17 LAB — GLUCOSE, CAPILLARY
Glucose-Capillary: 121 mg/dL — ABNORMAL HIGH (ref 70–99)
Glucose-Capillary: 114 mg/dL — ABNORMAL HIGH (ref 70–99)
Glucose-Capillary: 206 mg/dL — ABNORMAL HIGH (ref 70–99)
Glucose-Capillary: 104 mg/dL — ABNORMAL HIGH (ref 70–99)
Glucose-Capillary: 106 mg/dL — ABNORMAL HIGH (ref 70–99)
Glucose-Capillary: 112 mg/dL — ABNORMAL HIGH (ref 70–99)
Glucose-Capillary: 118 mg/dL — ABNORMAL HIGH (ref 70–99)

## 2023-07-17 LAB — COOXEMETRY PANEL
Carboxyhemoglobin: 3.4 % — ABNORMAL HIGH (ref 0.5–1.5)
Methemoglobin: <0.7 % (ref 0.0–1.5)
O2 Saturation: 85.2 %
Total hemoglobin: 10.1 g/dL — ABNORMAL LOW (ref 12.0–16.0)

## 2023-07-17 LAB — HEMOGLOBIN A1C
Hgb A1c MFr Bld: 5 % (ref 4.8–5.6)
Mean Plasma Glucose: 96.8 mg/dL

## 2023-07-17 LAB — CBC
HCT: 31.6 % — ABNORMAL LOW (ref 36.0–46.0)
Hemoglobin: 9.7 g/dL — ABNORMAL LOW (ref 12.0–15.0)
MCH: 26.4 pg (ref 26.0–34.0)
MCHC: 30.7 g/dL (ref 30.0–36.0)
MCV: 86.1 fL (ref 80.0–100.0)
Platelets: 87 10*3/uL — ABNORMAL LOW (ref 150–400)
RBC: 3.67 MIL/uL — ABNORMAL LOW (ref 3.87–5.11)
RDW: 21.1 % — ABNORMAL HIGH (ref 11.5–15.5)
WBC: 6.1 10*3/uL (ref 4.0–10.5)
nRBC: 0 % (ref 0.0–0.2)

## 2023-07-17 LAB — BASIC METABOLIC PANEL WITH GFR
Anion gap: 13 (ref 5–15)
BUN: 21 mg/dL — ABNORMAL HIGH (ref 6–20)
CO2: 36 mmol/L — ABNORMAL HIGH (ref 22–32)
Calcium: 9.2 mg/dL (ref 8.9–10.3)
Chloride: 85 mmol/L — ABNORMAL LOW (ref 98–111)
Creatinine, Ser: 1.07 mg/dL — ABNORMAL HIGH (ref 0.44–1.00)
GFR, Estimated: >60 mL/min (ref >60)
Glucose, Bld: 135 mg/dL — ABNORMAL HIGH (ref 70–99)
Potassium: 3.2 mmol/L — ABNORMAL LOW (ref 3.5–5.1)
Sodium: 134 mmol/L — ABNORMAL LOW (ref 135–145)

## 2023-07-17 LAB — LACTATE DEHYDROGENASE: LDH: 477 U/L — ABNORMAL HIGH (ref 98–192)

## 2023-07-17 LAB — HEPARIN LEVEL (UNFRACTIONATED)
Heparin Unfractionated: 0.41 [IU]/mL (ref 0.30–0.70)
Heparin Unfractionated: 0.35 [IU]/mL (ref 0.30–0.70)

## 2023-07-17 LAB — PROTIME-INR
INR: 1.2 (ref 0.8–1.2)
Prothrombin Time: 15.6 s — ABNORMAL HIGH (ref 11.4–15.2)

## 2023-07-17 LAB — APTT
aPTT: 87 s — ABNORMAL HIGH (ref 24–36)
aPTT: 100 s — ABNORMAL HIGH (ref 24–36)

## 2023-07-17 LAB — HEPATITIS C ANTIBODY: HCV Ab: NONREACTIVE

## 2023-07-17 LAB — HEPATITIS B SURFACE ANTIBODY,QUALITATIVE: Hep B S Ab: NONREACTIVE

## 2023-07-17 LAB — LIPID PANEL
Cholesterol: 109 mg/dL (ref 0–200)
HDL: 47 mg/dL (ref >40)
LDL Cholesterol: 54 mg/dL (ref 0–99)
Total CHOL/HDL Ratio: 2.3 ratio
Triglycerides: 38 mg/dL (ref <150)
VLDL: 8 mg/dL (ref 0–40)

## 2023-07-17 LAB — ANTITHROMBIN III: AntiThromb III Func: 77 % (ref 75–120)

## 2023-07-17 LAB — MAGNESIUM: Magnesium: 1.9 mg/dL (ref 1.7–2.4)

## 2023-07-17 LAB — T4, FREE: Free T4: 1.18 ng/dL — ABNORMAL HIGH (ref 0.61–1.12)

## 2023-07-17 LAB — TSH: TSH: 2.128 u[IU]/mL (ref 0.350–4.500)

## 2023-07-17 LAB — URIC ACID: Uric Acid, Serum: 6.9 mg/dL (ref 2.5–7.1)

## 2023-07-17 LAB — HEPATITIS B SURFACE ANTIGEN: Hepatitis B Surface Ag: NONREACTIVE

## 2023-07-17 MED ORDER — MAGNESIUM SULFATE 2 GM/50ML IV SOLN
2.0000 g | Freq: Once | INTRAVENOUS | Status: AC
  Administered 2023-07-17: 2 g via INTRAVENOUS
  Filled 2023-07-17: qty 50

## 2023-07-17 MED ORDER — FLUTICASONE PROPIONATE 50 MCG/ACT NA SUSP
2.0000 | Freq: Every day | NASAL | Status: DC
  Administered 2023-07-17 – 2023-07-23 (×6): 2 via NASAL
  Filled 2023-07-17: qty 16

## 2023-07-17 MED ORDER — FUROSEMIDE 10 MG/ML IJ SOLN
80.0000 mg | INTRAMUSCULAR | Status: AC
  Administered 2023-07-17 (×2): 80 mg via INTRAVENOUS
  Filled 2023-07-17 (×2): qty 8

## 2023-07-17 MED ORDER — POTASSIUM CHLORIDE CRYS ER 20 MEQ PO TBCR
40.0000 meq | EXTENDED_RELEASE_TABLET | ORAL | Status: AC
  Administered 2023-07-17 (×3): 40 meq via ORAL
  Filled 2023-07-17 (×3): qty 2

## 2023-07-17 MED ORDER — POTASSIUM CHLORIDE CRYS ER 20 MEQ PO TBCR
40.0000 meq | EXTENDED_RELEASE_TABLET | ORAL | Status: AC
  Administered 2023-07-17 – 2023-07-18 (×2): 40 meq via ORAL
  Filled 2023-07-17 (×2): qty 2

## 2023-07-17 MED ORDER — FUROSEMIDE 10 MG/ML IJ SOLN: 80.0000 mg | Freq: Two times a day (BID) | INTRAMUSCULAR | Status: DC

## 2023-07-17 MED ORDER — AMIODARONE HCL 200 MG PO TABS
400.0000 mg | ORAL_TABLET | Freq: Two times a day (BID) | ORAL | Status: DC
  Administered 2023-07-17 – 2023-07-23 (×14): 400 mg via ORAL
  Filled 2023-07-17 (×14): qty 2

## 2023-07-17 MED ORDER — FUROSEMIDE 10 MG/ML IJ SOLN
80.0000 mg | Freq: Two times a day (BID) | INTRAMUSCULAR | Status: DC
  Administered 2023-07-18 – 2023-07-19 (×3): 80 mg via INTRAVENOUS
  Filled 2023-07-17 (×3): qty 8

## 2023-07-17 MED ORDER — LORATADINE 10 MG PO TABS
10.0000 mg | ORAL_TABLET | Freq: Every day | ORAL | Status: DC
  Administered 2023-07-17 – 2023-07-23 (×7): 10 mg via ORAL
  Filled 2023-07-17 (×8): qty 1

## 2023-07-17 NOTE — Progress Notes (Signed)
 PHARMACY - ANTICOAGULATION CONSULT NOTE  Pharmacy Consult for heparin  Indication: IABP>impella 5.5  Allergies  Allergen Reactions   Sulfa  Antibiotics Itching, Swelling and Rash    Lip swelling    Patient Measurements: Height: 5' 10 (177.8 cm) Weight: 106.8 kg (235 lb 7.2 oz) IBW/kg (Calculated) : 68.5 HEPARIN  DW (KG): 93  Vital Signs: Temp: 98.6 F (37 C) (06/26 1845) Temp Source: Oral (06/26 1400) BP: 99/80 (06/26 1845) Pulse Rate: 80 (06/26 1845)  Labs: Recent Labs    07/15/23 0335 07/15/23 0800 07/15/23 1641 07/16/23 0149 07/16/23 0342 07/16/23 0835 07/16/23 1000 07/16/23 1548 07/16/23 2311 07/17/23 0500 07/17/23 0942 07/17/23 1816  HGB 9.7*  --   --   --   --   --  10.5*  --   --  9.7*  --   --   HCT 32.4*  --   --   --   --   --  35.0*  --   --  31.6*  --   --   PLT 76*  --   --   --   --   --  88*  --   --  87*  --   --   APTT  --   --   --   --   --   --   --   --   --  87* 100*  --   LABPROT  --   --   --   --   --   --   --   --   --  15.6*  --   --   INR  --   --   --   --   --   --   --   --   --  1.2  --   --   HEPARINUNFRC  --    < > 0.16*   < >  --    < >  --    < > 0.28*  --  0.41 0.35  CREATININE 0.96  --  1.07*  --  1.00  --   --   --   --  1.07*  --   --    < > = values in this interval not displayed.    Estimated Creatinine Clearance: 86.9 mL/min (A) (by C-G formula based on SCr of 1.07 mg/dL (H)).  Assessment: 53 yof who presented for mTEER complicated with torn mitral valve leaflet resulting in torrential MR and cardiogenic shock requiring IABP. On warfarin PTA for hx PE (LD 6/12) - was on enoxaparin  while warfarin on hold for procedure (LD 6/17).  Heparin  infusion for IABP restarted 2 hours after procedure 6/18. Impella placed 6/19.    Heparin  level this evening remains in goal range on heparin  2250 units/hr. No issues with infusion running or signs of bleeding per RN. Hgb stable at 9.7, PLT trending down to 87, stable from  yesterday.  Goal of Therapy:  Heparin  level 0.3-0.5 units/ml Monitor platelets by anticoagulation protocol: Yes   Plan:  Continue heparin  infusion at 2250 units/hr Daily heparin  level and CBC (monitor platelets) Monitor for signs/symptoms of bleeding  Harlene Barlow, Berdine BIRCH, BCPS, BCCP Clinical Pharmacist  07/17/2023 7:28 PM   [facility] pharmacy phone numbers are listed on [email or web address]

## 2023-07-17 NOTE — TOC Progression Note (Signed)
 Transition of Care [facility]) - Progression Note    Patient Details  Name: [name] [name] MRN: [id number] Date of Birth: 1976/01/02  [facility]) CM/SW Contact  Justina Delcia Czar, RN Phone Number: [phone] 07/17/2023, 5:15 PM  Clinical Narrative:    Reviewed for medical readiness.  TOC CM contacted pt's mother, states pt lives in the home with her and father. Pt currently active with [facility] for Home Milrinone . No DME in home. Pt has scale for daily weights.  Will continue to follow for dc needs.   Expected Discharge Plan: IP Rehab Facility Barriers to Discharge: Continued Medical Work up  Expected Discharge Plan and Services   Discharge Planning Services: CM Consult Post Acute Care Choice: IP Rehab Living arrangements for the past 2 months: Single Family Home                           HH Arranged: RN [facility] Agency: [facility] Date HH Agency Contacted: 07/11/23 Time HH Agency Contacted: 1534 Representative spoke with at [facility] Agency: Pam RN   Social Determinants of Health (SDOH) Interventions SDOH Screenings   Food Insecurity: No Food Insecurity (05/20/2023)  Housing: Low Risk  (05/20/2023)  Transportation Needs: No Transportation Needs (05/20/2023)  Utilities: Not At Risk (05/20/2023)  Alcohol  Screen: Low Risk  (04/03/2023)  Depression (PHQ2-9): Low Risk  (05/26/2023)  Financial Resource Strain: Low Risk  (04/03/2023)  Physical Activity: Insufficiently Active (04/03/2023)  Social Connections: Socially Isolated (04/03/2023)  Stress: No Stress Concern Present (04/03/2023)  Tobacco Use: High Risk (07/10/2023)    Readmission Risk Interventions    07/03/2022   12:12 PM 02/13/2022   12:41 PM 12/31/2021    1:52 PM  Readmission Risk Prevention Plan  Transportation Screening Complete Complete   HRI or Home Care Consult Complete Complete Complete  Social Work Consult for Recovery Care Planning/Counseling Complete Complete Complete  Palliative Care  Screening Not Applicable Not Applicable Not Applicable  Medication Review Oceanographer) Complete Complete Complete

## 2023-07-17 NOTE — Progress Notes (Signed)
 PHARMACY - ANTICOAGULATION CONSULT NOTE  Pharmacy Consult for heparin  Indication: IABP>impella 5.5  Allergies  Allergen Reactions   Sulfa  Antibiotics Itching, Swelling and Rash    Lip swelling    Patient Measurements: Height: 5' 10 (177.8 cm) Weight: 106.8 kg (235 lb 7.2 oz) IBW/kg (Calculated) : 68.5 HEPARIN  DW (KG): 93  Vital Signs: Temp: 98.2 F (36.8 C) (06/26 0915) Temp Source: Core (06/26 1000) BP: 111/69 (06/26 1039) Pulse Rate: 55 (06/26 0915)  Labs: Recent Labs    07/15/23 0335 07/15/23 0800 07/15/23 1641 07/16/23 0149 07/16/23 0342 07/16/23 0835 07/16/23 1000 07/16/23 1548 07/16/23 2311 07/17/23 0500 07/17/23 0942  HGB 9.7*  --   --   --   --   --  10.5*  --   --  9.7*  --   HCT 32.4*  --   --   --   --   --  35.0*  --   --  31.6*  --   PLT 76*  --   --   --   --   --  88*  --   --  87*  --   APTT  --   --   --   --   --   --   --   --   --  87*  --   LABPROT  --   --   --   --   --   --   --   --   --  15.6*  --   INR  --   --   --   --   --   --   --   --   --  1.2  --   HEPARINUNFRC  --    < > 0.16*   < >  --    < >  --  0.21* 0.28*  --  0.41  CREATININE 0.96  --  1.07*  --  1.00  --   --   --   --  1.07*  --    < > = values in this interval not displayed.    Estimated Creatinine Clearance: 86.9 mL/min (A) (by C-G formula based on SCr of 1.07 mg/dL (H)).  Assessment: 55 yof who presented for mTEER complicated with torn mitral valve leaflet resulting in torrential MR and cardiogenic shock requiring IABP. On warfarin PTA for hx PE (LD 6/12) - was on enoxaparin  while warfarin on hold for procedure (LD 6/17).  Heparin  infusion for IABP restarted 2 hours after procedure 6/18. Impella placed 6/19 - restarted heparin  infusion at 500 units/hr.   6/26 1100: Heparin  level 0.41, therapeutic on heparin  2250 units/hr. No issues with infusion running or signs of bleeding per RN. Hgb stable at 9.7, PLT trending down to 87, stable from yesterday.  Goal of  Therapy:  Heparin  level 0.3-0.5 units/ml Monitor platelets by anticoagulation protocol: Yes   Plan:  Continue heparin  infusion at 2250 units/hr Confirmatory heparin  level in 6 hours Daily heparin  level and CBC (monitor platelets) Monitor for signs/symptoms of bleeding  Morna Breach, PharmD PGY2 Cardiology Pharmacy Resident 07/17/2023 10:58 AM

## 2023-07-17 NOTE — Progress Notes (Signed)
 OT Cancellation Note  Patient Details Name: [name] [name] MRN: [id number] DOB: Oct 06, 1975   Cancelled Treatment:    Reason Eval/Treat Not Completed: Other (comment).  Just walked with RN, c/o headache.  OT will try back this afternoon.  Simran Mannis D Chloeanne Poteet 07/17/2023, 9:59 AM 07/17/2023  RP, OTR/L  Acute Rehabilitation Services  Office:  [phone]

## 2023-07-17 NOTE — Plan of Care (Signed)

## 2023-07-17 NOTE — Progress Notes (Signed)
 PHARMACY - ANTICOAGULATION CONSULT NOTE  Pharmacy Consult for heparin  Indication: IABP>impella 5.5  Allergies  Allergen Reactions   Sulfa  Antibiotics Itching, Swelling and Rash    Lip swelling    Patient Measurements: Height: 5' 10 (177.8 cm) Weight: 111.4 kg (245 lb 9.5 oz) IBW/kg (Calculated) : 68.5 HEPARIN  DW (KG): 93  Vital Signs: Temp: 98.8 F (37.1 C) (06/26 0215) Temp Source: Core (06/26 0000) BP: 105/89 (06/26 0215) Pulse Rate: 83 (06/26 0215)  Labs: Recent Labs    07/14/23 0417 07/14/23 1742 07/15/23 0335 07/15/23 0800 07/15/23 1641 07/16/23 0149 07/16/23 0342 07/16/23 0835 07/16/23 1000 07/16/23 1548 07/16/23 2311  HGB 9.9*  --  9.7*  --   --   --   --   --  10.5*  --   --   HCT 33.3*  --  32.4*  --   --   --   --   --  35.0*  --   --   PLT 81*  --  76*  --   --   --   --   --  88*  --   --   HEPARINUNFRC <0.10*   < >  --    < > 0.16*   < >  --  0.10*  --  0.21* 0.28*  CREATININE 0.95  --  0.96  --  1.07*  --  1.00  --   --   --   --    < > = values in this interval not displayed.    Estimated Creatinine Clearance: 95.1 mL/min (by C-G formula based on SCr of 1 mg/dL).  Assessment: 8 yof who presented for mTEER complicated with torn mitral valve leaflet resulting in torrential MR and cardiogenic shock requiring IABP. On warfarin PTA for hx PE (LD 6/12) - was on enoxaparin  while warfarin on hold for procedure (LD 6/17).  Heparin  infusion for IABP restarted 2 hours after procedure 6/18. Impella placed 6/19 - restarted heparin  infusion at 500 units/hr.   6/25 AM: Heparin  level 0.1, subtherapeutic on heparin  infusion at 1800 units/hr. No issues with infusion running or signs of bleeding per RN. Hgb 9.7, PLT trending down to 76. Remains on P9.   6/25 PM: heparin  level remains subtherapeutic at 0.21 on 2000 units/hr. No issues with the infusion or bleeding reported.  6/26 AM update:  Heparin  level sub-therapeutic but trending up  Goal of Therapy:   Heparin  level 0.3-0.5 units/ml Monitor platelets by anticoagulation protocol: Yes   Plan:  Increase heparin  infusion to 2250 units/hr 6hr heparin  level Daily heparin  level and CBC (monitor platelets) Monitor for signs/symptoms of bleeding  Lynwood Mckusick, PharmD, BCPS Clinical Pharmacist Phone: [phone]

## 2023-07-17 NOTE — Progress Notes (Signed)
 Advanced Heart Failure Rounding Note  Cardiologist: Ria Commander, DO  Chief Complaint: Cardiogenic Shock Subjective:    6/17: mTEER with sev posterior leaflet restriction resulting in tear. IABP + PAC. PCWP 40, v up to 60 6/24 : Aggressive diuresed. Started on lasix  drip. Brisk diuresis noted.   Impella 5.5 P9 : Flow 5.2. CPO 1  Remains on milrinone  0.375 mcg.   CVP in the chair --> 6   Swan#s: PAP: (42-99)/(14-54) 53/18 CVP:  [1 mmHg-36 mmHg] 1 mmHg CO:  [6.3 L/min-8.4 L/min] 8.4 L/min CI:  [2.75 L/min/m2-3.7 L/min/m2] 3.7 L/min/m2  Complaining of severe headache   Objective:    Weight Range: 106.8 kg Body mass index is 33.78 kg/m.   Vital Signs:   Temp:  [97 F (36.1 C)-99.3 F (37.4 C)] 98.2 F (36.8 C) (06/26 0915) Pulse Rate:  [55-168] 55 (06/26 0915) Resp:  [10-31] 19 (06/26 0915) BP: (59-114)/(32-95) 92/82 (06/26 0900) SpO2:  [78 %-100 %] 100 % (06/26 0915) Weight:  [106.8 kg] 106.8 kg (06/26 0615) Last BM Date : 07/17/23  Weight change: Filed Weights   07/09/23 0636 07/10/23 0349 07/17/23 0615  Weight: 110.2 kg 111.4 kg 106.8 kg   Intake/Output:  Intake/Output Summary (Last 24 hours) at 07/17/2023 1018 Last data filed at 07/17/2023 0900 Gross per 24 hour  Intake 1499.31 ml  Output 4125 ml  Net -2625.69 ml   CVP 5-6  Physical Exam  General:   No resp difficulty. In the chair.  Neck: supple. no JVD.  Cor: PMI nondisplaced. Regular rate & rhythm. No rubs, gallops or murmurs. + 5.5 Impella  Lungs: clear Abdomen: soft, nontender, nondistended.  Extremities: no cyanosis, clubbing, rash, edema Neuro: alert & oriented x3     Telemetry  SR 80s  Labs    CBC Recent Labs    07/16/23 1000 07/17/23 0500  WBC 6.3 6.1  HGB 10.5* 9.7*  HCT 35.0* 31.6*  MCV 88.4 86.1  PLT 88* 87*   Basic Metabolic Panel Recent Labs    93/74/74 0342 07/17/23 0500  NA 135 134*  K 3.3* 3.2*  CL 95* 85*  CO2 34* 36*  GLUCOSE 95 135*  BUN 21* 21*   CREATININE 1.00 1.07*  CALCIUM  8.5* 9.2  MG 1.8 1.9   Liver Function Tests No results for input(s): AST, ALT, ALKPHOS, BILITOT, PROT, ALBUMIN  in the last 72 hours.  BNP (last 3 results) Recent Labs    05/28/23 1224 06/30/23 1218 07/04/23 1057  BNP 1,155.3* 600.6* 612.0*   Fasting Lipid Panel Recent Labs    07/17/23 0500  CHOL 109  HDL 47  LDLCALC 54  TRIG 38  CHOLHDL 2.3    Medications:    Scheduled Medications:  acetaminophen   500 mg Oral Q6H   Chlorhexidine  Gluconate Cloth  6 each Topical Daily   feeding supplement  237 mL Oral TID BM   hydrALAZINE   10 mg Oral Q8H   insulin  aspart  0-9 Units Subcutaneous Q4H   multivitamin with minerals  1 tablet Oral Daily   pantoprazole   40 mg Oral Daily   polyethylene glycol  17 g Oral BID   potassium chloride   40 mEq Oral Q4H   senna-docusate  2 tablet Oral BID   spironolactone   25 mg Oral Daily   Infusions:  amiodarone  30 mg/hr (07/17/23 0900)   furosemide  (LASIX ) 200 mg in dextrose  5 % 100 mL (2 mg/mL) infusion 10 mg/hr (07/17/23 0901)   heparin  2,250 Units/hr (07/17/23 0900)  milrinone  0.375 mcg/kg/min (07/17/23 0900)   sodium bicarbonate  25 mEq (Impella PURGE) in dextrose  5 % 1000 mL bag     PRN Medications: acetaminophen , albuterol , ALPRAZolam , HYDROmorphone  (DILAUDID ) injection, ondansetron  (ZOFRAN ) IV, mouth rinse, oxyCODONE , oxyCODONE   Patient Profile   48 y.o. female with history of chronic systolic HF with biventricular dysfunction on home inotrope, severe MR, hx PE/DVT, obesity, CAD, anemia, tobacco use. Presented for mTEER on 07/09/23. Worsening MR leading to cardiogenic shock.  Assessment/Plan   Cardiogenic shock: due to torrential MR s/p mTEER complicated by severely restricted posterior leaflet that was damaged during repair attempt, IABP and nipride  needed to stabilize. Transitioned to Impella 5.5. Consideration for LVAD workup on Monday, but she has significant social concerns. Not a txp  candidate with tobacco use.  I do not believe that she would be able to survive without LVAD support, the alternative is comfort care.  -- Continue Impella 5.5 at P9  5.2.  -- Continue milrinone  to 0.375mcg/kg/min. CO-OX 62%  - See below for diuretic regimen.   Acute on chronic systolic HF with biventricular dysfunction - stage D cardiomyopathy - nonobstructive CAD on cath 2023.  - RHC 5/25 on milrinone  0.25: severe pulmonary venous HTN, severely elevated PCWP with prominent v waves, preserved CO - Continue milrinone  0.375 mcg. CO-OX 85%.  - CVP 5-6 . Stop lasix  drip. Start lasix  80 mg twice a day.  - Continue 10 mg hydralazine  three times a day. - needs stabilization and LVAD candidacy determination   Torrential MR - See above -Improved with unloading, extubated 6/20   Acute hypoxic respiratory failure - In setting of acute on chronic CHF and severe/torrential  - On 3 liters . Sats stable. Add incentive spirometer.   Hx of PE/DVT w/ IVC filter - Evaluated at [facility] where thrombophilia work-up was negative (Lupus inhibitor, anticardiolipin antibodies, and anti beta-2  glycoprotein antibodies were negative; protein C activity 85%, protein S activity 76%, factor V Leiden, and prothrombin gene mutation ) - IVC filter in place; could not be removed due to significant thrombus burden on filter at [facility]  - Heparin  gtt for now with Impella and potential procedures   CAD - LHC in 5/23 w/ nonobstructive CAD involving RCA - No aspirin  with need for anticoagulation.  Tobacco use - Smoking 3 cigarettes/day at time of last follow-up.  Anemia  HGb 9.7   Transfuse for HGB < 8. No obvious source.   Headache, Severe CT Head now.   PVCs Stop amio drip. Start po amio.   VAD work up underway.  Palliative Care consulted.   Length of Stay: 8  CRITICAL CARE Performed by: [name] NP-C    Total critical care time: 15 minutes  Critical care time was exclusive of separately billable  procedures and treating other patients.  Critical care was necessary to treat or prevent imminent or life-threatening deterioration.  Critical care was time spent personally by me on the following activities: development of treatment plan with patient and/or surrogate as well as nursing, discussions with consultants, evaluation of patient's response to treatment, examination of patient, obtaining history from patient or surrogate, ordering and performing treatments and interventions, ordering and review of laboratory studies, ordering and review of radiographic studies, pulse oximetry and re-evaluation of patient's condition.    [name], NP  07/17/2023, 10:18 AM  Advanced Heart Failure Team Pager [phone] (M-F; 7a - 5p)  Please contact [facility] for night-coverage after hours (5p -7a ) and weekends on [email or web address]

## 2023-07-17 NOTE — Consult Note (Signed)
 Consultation Note Date: 07/17/2023   Patient Name: [name] [name]  DOB: [birth year]  MRN: [id number]  Age / Sex: 48 y.o., female  PCP: Grooms, Charmaine, PA-C Referring Physician: Gardenia Led, DO  Reason for Consultation: VAD evaluation  HPI/Patient Profile: 48 y.o. female  with past medical history of biventricular heart failure on home inotrope, severe MR, h/o PE/DVT, CAD, obesity, anemia, tobacco use admitted on 07/09/2023 for scheduled mTEER completed 6/17 but complicated by severely restricted posterior leaflet damaged during repair attempt. Requiring IABP and nipride . Being evaluated for LVAD candidacy although there are concerns noted.   Clinical Assessment and Goals of Care: Consult received and chart review completed. I met today with [name] [name]. She is lying in bed. She is tired and emotional. I reviewed palliative role for support as well as part of LVAD evaluation. [name] [name] is tearful and shares how she did not realize how seriously ill she was prior to admission. She was not prepared for this. She is interested in LVAD and wants to live for her 28 year old daughter. Her daughter is what brings her joy in life and is her primary motivation for doing what is needed to live.   [name] [name] and her daughter live with her mother and stepfather. She shares that her mother and stepfather can provide 24/7 support at home (they both work but different shifts). Her stepfather is not in great health but still working some. Her mother is able to assist and does drive does does not drive in [place]. They live in [place]. [name] [name] shares that she has had issues with having assistance with transportation to appointments in [place]. We reviewed LVAD and the support and care she will need to have to be successful. [name] [name] understands and shares that her mother, aunt, and stepfather can be supportive to her.   We further  discussed her wishes and what she is willing to go through. [name] [name] wishes to pursue LVAD and live. If LVAD is not an option then [name] [name] knows her prognosis is very poor. She would not want to be kept alive on breathing machine without improvement. If LVAD not an option she would not want to go through CPR. However, if there are options and hope then she is willing to go through whatever is needed to be here for her daughter. We discussed the importance of putting some of her wishes down on paper in Advance Directive and she is willing to look at this. We agree to review paperwork in a few days. She identifies her mother as her surrogate Management consultant. She has had some conversations with her mother about her wishes and will continue to share her wishes and have these conversations.   All questions/concerns addressed. Emotional support provided.   Primary Decision Maker PATIENT    SUMMARY OF RECOMMENDATIONS   - Hopeful for LVAD option - CSW to further evaluate support system and caregiver abilities  Code Status/Advance Care Planning: Full code (would reconsider based on options available and expectations)   Symptom Management:  Per heart  failure team.  Headache: Noted sinus opacification on CT head. Headache noted to be primarily central forehead and between/behind eyes. Will add loratadine  and nasal corticosteroid.   Prognosis:  Overall prognosis very poor unless a candidate for advanced therapies.   Discharge Planning: To Be Determined      Primary Diagnoses: Present on Admission:  Acute on chronic combined systolic (congestive) and diastolic (congestive) heart failure (HCC)  Chronic pain syndrome  Essential hypertension  HFrEF (heart failure with reduced ejection fraction) (HCC)  Obesity (BMI 30-39.9)  Severe Iron  deficiency anemia  Tobacco use disorder  Severe mitral regurgitation   I have reviewed the medical record, interviewed the patient and family, and examined the  patient. The following aspects are pertinent.  Past Medical History:  Diagnosis Date   Anemia    Blood transfusion without reported diagnosis    CAD (coronary artery disease)    Nonobstructive   Closed left ankle fracture    August 30 2012   Clotting disorder [facility])    DVT (deep venous thrombosis) (HCC)    L leg   Fibroid tumor    HFrEF (heart failure with reduced ejection fraction) (HCC)    Hidradenitis    Hypertension    Mitral regurgitation    Myocardial infarction (HCC)    NICM (nonischemic cardiomyopathy) (HCC)    NSVT (nonsustained ventricular tachycardia) (HCC)    Obesity    OSA on CPAP    Pulmonary embolism (HCC) 04/2013   Sleep apnea    Social History   Socioeconomic History   Marital status: Widowed    Spouse name: Not on file   Number of children: Not on file   Years of education: Not on file   Highest education level: Some college, no degree  Occupational History   Not on file  Tobacco Use   Smoking status: Every Day    Types: Cigarettes    Passive exposure: Never   Smokeless tobacco: Never   Tobacco comments:    Smokes 2-3 cigarettes daily as of 02/13/22  Vaping Use   Vaping status: Never Used  Substance and Sexual Activity   Alcohol  use: Yes    Alcohol /week: 2.0 standard drinks of alcohol     Types: 2 Cans of beer per week    Comment: twice a week   Drug use: No   Sexual activity: Not Currently    Birth control/protection: Pill, None  Other Topics Concern   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    Has a 48 year old [name] [name].   Live with parents.   Was working 5 days a weeks.   Not working right now.    Attends church.    Social Drivers of Corporate investment banker Strain: Low Risk  (04/03/2023)   Overall Financial Resource Strain (CARDIA)    Difficulty of Paying Living Expenses: Not very hard  Food Insecurity: No Food Insecurity (05/20/2023)   Hunger Vital Sign    Worried About  Running Out of Food in the Last Year: Never true    Ran Out of Food in the Last Year: Never true  Transportation Needs: No Transportation Needs (05/20/2023)   PRAPARE - Administrator, Civil Service (Medical): No    Lack of Transportation (Non-Medical): No  Physical Activity: Insufficiently Active (04/03/2023)   Exercise Vital Sign    Days of Exercise per Week: 2 days    Minutes  of Exercise per Session: 10 min  Stress: No Stress Concern Present (04/03/2023)   [employer] of Occupational Health - Occupational Stress Questionnaire    Feeling of Stress : Only a little  Social Connections: Socially Isolated (04/03/2023)   Social Connection and Isolation Panel    Frequency of Communication with Friends and Family: Twice a week    Frequency of Social Gatherings with Friends and Family: Twice a week    Attends Religious Services: Never    Database administrator or Organizations: No    Attends Banker Meetings: Never    Marital Status: Widowed   Family History  Problem Relation Age of Onset   Hypertension Mother    Diabetes Paternal Aunt    Diabetes Paternal Uncle    Other Father        blood clots   Cancer Maternal Grandmother    Heart attack Paternal Grandmother    Colon cancer Neg Hx    Scheduled Meds:  acetaminophen   500 mg Oral Q6H   Chlorhexidine  Gluconate Cloth  6 each Topical Daily   feeding supplement  237 mL Oral TID BM   hydrALAZINE   10 mg Oral Q8H   insulin  aspart  0-9 Units Subcutaneous Q4H   multivitamin with minerals  1 tablet Oral Daily   pantoprazole   40 mg Oral Daily   polyethylene glycol  17 g Oral BID   potassium chloride   40 mEq Oral Q4H   senna-docusate  2 tablet Oral BID   spironolactone   25 mg Oral Daily   Continuous Infusions:  amiodarone  30 mg/hr (07/17/23 0900)   furosemide  (LASIX ) 200 mg in dextrose  5 % 100 mL (2 mg/mL) infusion 10 mg/hr (07/17/23 0901)   heparin  2,250 Units/hr (07/17/23 0700)   milrinone  0.375 mcg/kg/min  (07/17/23 0701)   sodium bicarbonate  25 mEq (Impella PURGE) in dextrose  5 % 1000 mL bag     PRN Meds:.acetaminophen , albuterol , ALPRAZolam , HYDROmorphone  (DILAUDID ) injection, ondansetron  (ZOFRAN ) IV, mouth rinse, oxyCODONE , oxyCODONE  Allergies  Allergen Reactions   Sulfa  Antibiotics Itching, Swelling and Rash    Lip swelling   Review of Systems  Constitutional:  Positive for activity change, appetite change and fatigue.  Respiratory:  Positive for cough and shortness of breath.   Neurological:  Positive for weakness and headaches.    Physical Exam Vitals and nursing note reviewed.  Constitutional:      General: She is not in acute distress.    Appearance: She is ill-appearing.   Cardiovascular:     Rate and Rhythm: Normal rate.     Comments: Impella Pulmonary:     Effort: No tachypnea, accessory muscle usage or respiratory distress.  Abdominal:     Palpations: Abdomen is soft.   Neurological:     Mental Status: She is alert and oriented to person, place, and time.     Vital Signs: BP 92/82   Pulse 81   Temp 98.4 F (36.9 C)   Resp 11   Ht 5' 10 (1.778 m)   Wt 106.8 kg   SpO2 100%   BMI 33.78 kg/m  Pain Scale: 0-10 POSS *See Group Information*: 2-Acceptable,Slightly drowsy, easily aroused Pain Score: 0-No pain   SpO2: SpO2: 100 % O2 Device:SpO2: 100 % O2 Flow Rate: .O2 Flow Rate (L/min): 3 L/min  IO: Intake/output summary:  Intake/Output Summary (Last 24 hours) at 07/17/2023 0913 Last data filed at 07/17/2023 0701 Gross per 24 hour  Intake 1385.27 ml  Output 4625 ml  Net -3239.73  ml    LBM: Last BM Date : 07/15/23 Baseline Weight: Weight: 110.2 kg Most recent weight: Weight: 106.8 kg     Palliative Assessment/Data:    Time Total: 75 min  Greater than 50%  of this time was spent counseling and coordinating care related to the above assessment and plan.  Signed by: [name], NP Palliative Medicine Team Pager # [phone] (M-F  8a-5p) Team Phone # [phone] (Nights/Weekends)

## 2023-07-18 ENCOUNTER — Inpatient Hospital Stay (HOSPITAL_COMMUNITY)

## 2023-07-18 DIAGNOSIS — Z01810 Encounter for preprocedural cardiovascular examination: Secondary | ICD-10-CM

## 2023-07-18 DIAGNOSIS — I340 Nonrheumatic mitral (valve) insufficiency: Secondary | ICD-10-CM | POA: Diagnosis not present

## 2023-07-18 DIAGNOSIS — R570 Cardiogenic shock: Secondary | ICD-10-CM

## 2023-07-18 LAB — BASIC METABOLIC PANEL WITH GFR
Anion gap: 10 (ref 5–15)
Anion gap: 13 (ref 5–15)
BUN: 19 mg/dL (ref 6–20)
BUN: 19 mg/dL (ref 6–20)
CO2: 35 mmol/L — ABNORMAL HIGH (ref 22–32)
CO2: 31 mmol/L (ref 22–32)
Calcium: 9.4 mg/dL (ref 8.9–10.3)
Calcium: 9.5 mg/dL (ref 8.9–10.3)
Chloride: 89 mmol/L — ABNORMAL LOW (ref 98–111)
Chloride: 89 mmol/L — ABNORMAL LOW (ref 98–111)
Creatinine, Ser: 1.1 mg/dL — ABNORMAL HIGH (ref 0.44–1.00)
Creatinine, Ser: 1.18 mg/dL — ABNORMAL HIGH (ref 0.44–1.00)
GFR, Estimated: >60 mL/min (ref >60)
GFR, Estimated: 58 mL/min — ABNORMAL LOW (ref >60)
Glucose, Bld: 97 mg/dL (ref 70–99)
Glucose, Bld: 93 mg/dL (ref 70–99)
Potassium: 5.1 mmol/L (ref 3.5–5.1)
Potassium: 4.2 mmol/L (ref 3.5–5.1)
Sodium: 134 mmol/L — ABNORMAL LOW (ref 135–145)
Sodium: 133 mmol/L — ABNORMAL LOW (ref 135–145)

## 2023-07-18 LAB — PULMONARY FUNCTION TEST
FEF 25-75 Pre: 0.92 L/s
FEF2575-%Pred-Pre: 28 %
FEV1-%Pred-Pre: 31 %
FEV1-Pre: 1.09 L
FEV1FVC-%Pred-Pre: 94 %
FEV6-%Pred-Pre: 33 %
FEV6-Pre: 1.43 L
FEV6FVC-%Pred-Pre: 102 %
FVC-%Pred-Pre: 32 %
FVC-Pre: 1.43 L
Pre FEV1/FVC ratio: 76 %
Pre FEV6/FVC Ratio: 100 %

## 2023-07-18 LAB — CBC
HCT: 32.7 % — ABNORMAL LOW (ref 36.0–46.0)
Hemoglobin: 9.9 g/dL — ABNORMAL LOW (ref 12.0–15.0)
MCH: 26.5 pg (ref 26.0–34.0)
MCHC: 30.3 g/dL (ref 30.0–36.0)
MCV: 87.4 fL (ref 80.0–100.0)
Platelets: 98 10*3/uL — ABNORMAL LOW (ref 150–400)
RBC: 3.74 MIL/uL — ABNORMAL LOW (ref 3.87–5.11)
RDW: 21.3 % — ABNORMAL HIGH (ref 11.5–15.5)
WBC: 5.6 10*3/uL (ref 4.0–10.5)
nRBC: 0 % (ref 0.0–0.2)

## 2023-07-18 LAB — GLUCOSE, CAPILLARY
Glucose-Capillary: 106 mg/dL — ABNORMAL HIGH (ref 70–99)
Glucose-Capillary: 107 mg/dL — ABNORMAL HIGH (ref 70–99)
Glucose-Capillary: 109 mg/dL — ABNORMAL HIGH (ref 70–99)
Glucose-Capillary: 158 mg/dL — ABNORMAL HIGH (ref 70–99)
Glucose-Capillary: 158 mg/dL — ABNORMAL HIGH (ref 70–99)
Glucose-Capillary: 95 mg/dL (ref 70–99)

## 2023-07-18 LAB — ECHOCARDIOGRAM LIMITED
Height: 70 in
S' Lateral: 6.3 cm
Weight: 3767.22 [oz_av]

## 2023-07-18 LAB — HEPATITIS B CORE ANTIBODY, TOTAL: HEP B CORE AB: NEGATIVE

## 2023-07-18 LAB — COOXEMETRY PANEL
Carboxyhemoglobin: 2.6 % — ABNORMAL HIGH (ref 0.5–1.5)
Methemoglobin: 0.8 % (ref 0.0–1.5)
O2 Saturation: 64.9 %
Total hemoglobin: 10.5 g/dL — ABNORMAL LOW (ref 12.0–16.0)

## 2023-07-18 LAB — MAGNESIUM: Magnesium: 2.1 mg/dL (ref 1.7–2.4)

## 2023-07-18 LAB — HEPARIN LEVEL (UNFRACTIONATED): Heparin Unfractionated: 0.36 [IU]/mL (ref 0.30–0.70)

## 2023-07-18 LAB — HEPATITIS B SURFACE ANTIBODY, QUANTITATIVE: Hep B S AB Quant (Post): <3.5 m[IU]/mL — ABNORMAL LOW

## 2023-07-18 LAB — LACTATE DEHYDROGENASE: LDH: 508 U/L — ABNORMAL HIGH (ref 98–192)

## 2023-07-18 MED ORDER — SPIRONOLACTONE 25 MG PO TABS
25.0000 mg | ORAL_TABLET | Freq: Every day | ORAL | Status: DC
  Administered 2023-07-18 – 2023-07-23 (×6): 25 mg via ORAL
  Filled 2023-07-18 (×6): qty 1

## 2023-07-18 NOTE — Progress Notes (Signed)
 BLE venous completed. Positive results given to nurse and Beckey, NP. LC, RVT

## 2023-07-18 NOTE — Progress Notes (Signed)
 Carotid ultrasound and BLE venous exam completed. Positive DVT results given to nurse and Beckey, NP on heart failure team.

## 2023-07-18 NOTE — Progress Notes (Signed)
 OT Cancellation Note  Patient Details Name: [name] [name] MRN: [id number] DOB: [birth year]   Cancelled Treatment:    Reason Eval/Treat Not Completed: Other (comment) (ECHO arriving during progress pt to bed report pt must be supine. THerapy session terminated prematurely. OT will return as time allows.ALLIE Ely Molt 07/18/2023, 10:24 AM

## 2023-07-18 NOTE — Progress Notes (Signed)
 Inpatient Rehab Admissions Coordinator:  Pt pending LVAD workup. Will continue to follow from a distance to determine appropriateness of CIR.   Tinnie Yvone Cohens, MS, CCC-SLP Admissions Coordinator [phone]

## 2023-07-18 NOTE — Progress Notes (Signed)
 PHARMACY - ANTICOAGULATION CONSULT NOTE  Pharmacy Consult for heparin  Indication: IABP>impella 5.5  Allergies  Allergen Reactions   Sulfa  Antibiotics Itching, Swelling and Rash    Lip swelling    Patient Measurements: Height: 5' 10 (177.8 cm) Weight: 106.8 kg (235 lb 7.2 oz) IBW/kg (Calculated) : 68.5 HEPARIN  DW (KG): 93  Vital Signs: Temp: 97.7 F (36.5 C) (06/27 0701) BP: 101/86 (06/27 0700) Pulse Rate: 82 (06/27 0701)  Labs: Recent Labs    07/16/23 1000 07/16/23 1548 07/17/23 0500 07/17/23 0942 07/17/23 1816 07/18/23 0519 07/18/23 0604  HGB 10.5*  --  9.7*  --   --  9.9*  --   HCT 35.0*  --  31.6*  --   --  32.7*  --   PLT 88*  --  87*  --   --  98*  --   APTT  --   --  87* 100*  --   --   --   LABPROT  --   --  15.6*  --   --   --   --   INR  --   --  1.2  --   --   --   --   HEPARINUNFRC  --    < >  --  0.41 0.35  --  0.36  CREATININE  --   --  1.07*  --  1.06* 1.10*  --    < > = values in this interval not displayed.    Estimated Creatinine Clearance: 84.5 mL/min (A) (by C-G formula based on SCr of 1.1 mg/dL (H)).  Assessment: 83 yof who presented for mTEER complicated with torn mitral valve leaflet resulting in torrential MR and cardiogenic shock requiring IABP. On warfarin PTA for hx PE (LD 6/12) - was on enoxaparin  while warfarin on hold for procedure (LD 6/17).  Heparin  infusion for IABP restarted 2 hours after procedure 6/18. Impella placed 6/19.    6/27 AM: Heparin  level 0.36, therapeutic on heparin  2250 units/hr. No issues with infusion running or signs of bleeding per RN. Hgb stable at 9.9, PLT slowly increasing to 98. LDH remains elevated at 508, stable from yesterday.  Goal of Therapy:  Heparin  level 0.3-0.5 units/ml Monitor platelets by anticoagulation protocol: Yes   Plan:  Continue heparin  infusion at 2250 units/hr Daily heparin  level and CBC (monitor platelets and LDH) Monitor for signs/symptoms of bleeding  Morna Breach,  PharmD PGY2 Cardiology Pharmacy Resident 07/18/2023 7:15 AM  [facility] pharmacy phone numbers are listed on [email or web address]

## 2023-07-18 NOTE — Progress Notes (Signed)
 Occupational Therapy Treatment Patient Details Name: [name] [name] MRN: [id number] DOB: 09/26/1975 Today's Date: 07/18/2023   History of present illness 48 yo presented 07/09/23 for transcatheter mitral valve repair with Mitraclip. Post-op severe residual MR and cardiogenic shock. 6/19 impella inserted; extubated 6/20; ?for LVAD workup  PMH-chronic HF (on home milrinone ); CAD, DVT, fibroid tumor, hidradenitis, cardiomyopathy, NSVT, PE, obesity   OT comments  Pt with pending LVAD possible Monday so OT will monitor for updated orders based on POC. Pt this session progressed with transfers and bed mobility. Pt in chair with word search. Pt in good spirits and smiling during session joking about her home town area. Recommendation remain appropriate for post acute rehabilitation needs.    Impella P9- highest setting currently    If plan is discharge home, recommend the following:  A lot of help with walking and/or transfers;A lot of help with bathing/dressing/bathroom;Assist for transportation;Help with stairs or ramp for entrance   Equipment Recommendations  None recommended by OT    Recommendations for Other Services      Precautions / Restrictions Precautions Precautions: Fall;Other (comment) Recall of Precautions/Restrictions: Impaired Precaution/Restrictions Comments: impella L shoulder access Restrictions Weight Bearing Restrictions Per Provider Order: No       Mobility Bed Mobility Overal bed mobility: Needs Assistance Bed Mobility: Supine to Sit     Supine to sit: Supervision, HOB elevated     General bed mobility comments: Supervision for safety, slow to rise, assist to manage lines/leads only. Cues for awareness.    Transfers Overall transfer level: Needs assistance Equipment used: Rolling walker (2 wheels) Transfers: Sit to/from Stand Sit to Stand: Contact guard assist, +2 safety/equipment           General transfer comment: CGA for safety, slow to rise but  stable with RW upon standing +2 for safety/equipment due to multiple lines.     Balance Overall balance assessment: Needs assistance Sitting-balance support: Feet supported, No upper extremity supported Sitting balance-Leahy Scale: Good     Standing balance support: During functional activity, Bilateral upper extremity supported Standing balance-Leahy Scale: Poor Standing balance comment: RW for support. During session, static stance without UE support not formally assessed this visit but anticipate would tolerate with CGA.                           ADL either performed or assessed with clinical judgement   ADL Overall ADL's : Needs assistance/impaired                                       General ADL Comments: setup in the chair with 3 types of word searches. pt eager to attempt    Extremity/Trunk Assessment Upper Extremity Assessment Upper Extremity Assessment: Overall WFL for tasks assessed   Lower Extremity Assessment Lower Extremity Assessment: Overall WFL for tasks assessed        Vision       Perception     Praxis     Communication Communication Communication: No apparent difficulties   Cognition Arousal: Alert Behavior During Therapy: WFL for tasks assessed/performed Cognition: No apparent impairments                               Following commands: Intact        Cueing   Cueing Techniques:  Verbal cues  Exercises Exercises: General Lower Extremity General Exercises - Lower Extremity Ankle Circles/Pumps: AROM, Both, 15 reps Gluteal Sets: Strengthening, 10 reps, Both, Seated Long Arc Quad: Both, 10 reps, Strengthening, Seated Heel Raises: Strengthening, Both, 10 reps, Standing    Shoulder Instructions       General Comments Impella - P9. 1.5L 98% sats at rest.    Pertinent Vitals/ Pain       Pain Assessment Pain Assessment: No/denies pain  Home Living                                           Prior Functioning/Environment              Frequency  Min 2X/week        Progress Toward Goals  OT Goals(current goals can now be found in the care plan section)  Progress towards OT goals: Progressing toward goals  Acute Rehab OT Goals Patient Stated Goal: to get stronger OT Goal Formulation: With patient Time For Goal Achievement: 07/28/23 Potential to Achieve Goals: Good ADL Goals Pt Will Perform Grooming: with modified independence;standing Pt Will Perform Lower Body Dressing: with supervision;sit to/from stand Pt Will Transfer to Toilet: with modified independence;regular height toilet;ambulating (simulated with chair transfer at SUpervision level)  Plan      Co-evaluation    PT/OT/SLP Co-Evaluation/Treatment: Yes Reason for Co-Treatment: Complexity of the patient's impairments (multi-system involvement);For patient/therapist safety;To address functional/ADL transfers;Necessary to address cognition/behavior during functional activity PT goals addressed during session: Mobility/safety with mobility;Balance;Proper use of DME;Strengthening/ROM OT goals addressed during session: ADL's and self-care      AM-PAC OT 6 Clicks Daily Activity     Outcome Measure   Help from another person eating meals?: None Help from another person taking care of personal grooming?: None Help from another person toileting, which includes using toliet, bedpan, or urinal?: A Lot Help from another person bathing (including washing, rinsing, drying)?: A Lot Help from another person to put on and taking off regular upper body clothing?: A Lot Help from another person to put on and taking off regular lower body clothing?: A Lot 6 Click Score: 16    End of Session Equipment Utilized During Treatment: Rolling walker (2 wheels)  OT Visit Diagnosis: Unsteadiness on feet (R26.81);Muscle weakness (generalized) (M62.81)   Activity Tolerance Patient tolerated treatment well    Patient Left in chair;with call bell/phone within reach   Nurse Communication Mobility status        Time: 8856-8796 [phone] interrupted so adding time here) OT Time Calculation (min): 20 min  Charges: OT General Charges $OT Visit: 1 Visit OT Treatments $Self Care/Home Management : 8-22 mins   Brynn, OTR/L  Acute Rehabilitation Services Office: [phone] .   Ely Molt 07/18/2023, 1:33 PM

## 2023-07-18 NOTE — Progress Notes (Signed)
 Physical Therapy Treatment Patient Details Name: [name] [name] MRN: [id number] DOB: Oct 24, 1975 Today's Date: 07/18/2023   History of Present Illness 48 yo presented 07/09/23 for transcatheter mitral valve repair with Mitraclip. Post-op severe residual MR and cardiogenic shock. 6/19 impella inserted; extubated 6/20; ?for LVAD workup  PMH-chronic HF (on home milrinone ); CAD, DVT, fibroid tumor, hidradenitis, cardiomyopathy, NSVT, PE, obesity    PT Comments  Tolerated well today. Increased stamina with more out of bed activity performed , ambulating approx 475 feet with RW, CGA for safety with assist from PT/OT/RN. Impella P at 9.  SpO2 92% on room air, mild dyspnea. No overt LOB or buckling but reliant on RW for support at this time. Reviewed LE exercises, encouraged routine performance each day. Patient will continue to benefit from skilled physical therapy services to further improve independence with functional mobility.    If plan is discharge home, recommend the following: Assistance with cooking/housework;Assist for transportation;Help with stairs or ramp for entrance;A little help with walking and/or transfers;A little help with bathing/dressing/bathroom   Can travel by private Psychologist, clinical (4 wheels);BSC/3in1    Recommendations for Other Services       Precautions / Restrictions Precautions Precautions: Fall;Other (comment) Recall of Precautions/Restrictions: Impaired Precaution/Restrictions Comments: impella L shoulder access Restrictions Weight Bearing Restrictions Per Provider Order: No     Mobility  Bed Mobility Overal bed mobility: Needs Assistance Bed Mobility: Supine to Sit     Supine to sit: Supervision, HOB elevated     General bed mobility comments: Supervision for safety, slow to rise, assist to manage lines/leads only. Cues for awareness.    Transfers Overall transfer level: Needs assistance Equipment used: Rolling  walker (2 wheels) Transfers: Sit to/from Stand Sit to Stand: Contact guard assist, +2 safety/equipment           General transfer comment: CGA for safety, slow to rise but stable with RW upon standing +2 for safety/equipment due to multiple lines.    Ambulation/Gait Ambulation/Gait assistance: Contact guard assist, +2 safety/equipment (+3 (PT, OT and RN)) Gait Distance (Feet): 475 Feet Assistive device: Rolling walker (2 wheels) Gait Pattern/deviations: Step-through pattern, Decreased stride length, Trunk flexed Gait velocity: dec Gait velocity interpretation: <1.8 ft/sec, indicate of risk for recurrent falls   General Gait Details: CGA for safety, with OT and RN for safely and line/lead management. Cues for upright posture, symptom awareness, pacing, and rest as needed. Able to complete without rest break. SpO2 92% on RA. BP 108/89 HR 83 end of session. No overt LOB or buckling noted. Good symptom awareness.   Stairs             Wheelchair Mobility     Tilt Bed    Modified Rankin (Stroke Patients Only)       Balance Overall balance assessment: Needs assistance Sitting-balance support: Feet supported, No upper extremity supported Sitting balance-Leahy Scale: Good     Standing balance support: During functional activity, Bilateral upper extremity supported Standing balance-Leahy Scale: Poor Standing balance comment: RW for support. During session, static stance without UE support not formally assessed this visit but anticipate would tolerate with CGA.                            Communication Communication Communication: No apparent difficulties  Cognition Arousal: Alert Behavior During Therapy: WFL for tasks assessed/performed   PT - Cognitive impairments: No family/caregiver present  to determine baseline                         Following commands: Intact      Cueing Cueing Techniques: Verbal cues  Exercises General Exercises -  Lower Extremity Ankle Circles/Pumps: AROM, Both, 15 reps Gluteal Sets: Strengthening, 10 reps, Both, Seated Long Arc Quad: Both, 10 reps, Strengthening, Seated Heel Raises: Strengthening, Both, 10 reps, Standing    General Comments General comments (skin integrity, edema, etc.): Impella - P9. 1.5L 98% sats at rest.      Pertinent Vitals/Pain Pain Assessment Pain Assessment: No/denies pain    Home Living                          Prior Function            PT Goals (current goals can now be found in the care plan section) Acute Rehab PT Goals Patient Stated Goal: to get better PT Goal Formulation: With patient Time For Goal Achievement: 07/26/23 Potential to Achieve Goals: Fair Progress towards PT goals: Progressing toward goals    Frequency    Min 2X/week      PT Plan      Co-evaluation PT/OT/SLP Co-Evaluation/Treatment: Yes Reason for Co-Treatment: Complexity of the patient's impairments (multi-system involvement);For patient/therapist safety;To address functional/ADL transfers;Necessary to address cognition/behavior during functional activity PT goals addressed during session: Mobility/safety with mobility;Balance;Proper use of DME;Strengthening/ROM        AM-PAC PT 6 Clicks Mobility   Outcome Measure  Help needed turning from your back to your side while in a flat bed without using bedrails?: A Little Help needed moving from lying on your back to sitting on the side of a flat bed without using bedrails?: A Little Help needed moving to and from a bed to a chair (including a wheelchair)?: A Little Help needed standing up from a chair using your arms (e.g., wheelchair or bedside chair)?: A Little Help needed to walk in hospital room?: A Little Help needed climbing 3-5 steps with a railing? : A Lot 6 Click Score: 17    End of Session Equipment Utilized During Treatment: Gait belt Activity Tolerance: Patient tolerated treatment well Patient left:  with nursing/sitter in room;with call bell/phone within reach;in chair;with chair alarm set Nurse Communication: Mobility status PT Visit Diagnosis: Unsteadiness on feet (R26.81);Muscle weakness (generalized) (M62.81);Difficulty in walking, not elsewhere classified (R26.2)     Time: 8860-8796 PT Time Calculation (min) (ACUTE ONLY): 24 min  Charges:    $Gait Training: 8-22 mins PT General Charges $$ ACUTE PT VISIT: 1 Visit                     Leontine Roads, PT, DPT [facility]  Rehabilitation Services Physical Therapist Office: [phone] Website: [email or web address]    Leontine GORMAN Roads 07/18/2023, 1:03 PM

## 2023-07-18 NOTE — Progress Notes (Signed)
 Advanced Heart Failure Rounding Note  Cardiologist: Ria Commander, DO  Chief Complaint: Cardiogenic Shock Subjective:    6/17: mTEER with sev posterior leaflet restriction resulting in tear. IABP + PAC. PCWP 40, v up to 60 6/24 : Aggressive diuresed. Started on lasix  drip. Brisk diuresis noted.   Impella 5.5 P9 : Flow 5.4. CPO 1.3  Remains on milrinone  0.375 mcg.   CVP 5/6. Now on BID diuretic dosing. -3.5L UOP  [place]wan#s: PAP: (42-100)/(14-43) 75/25 (43) CVP:  [1 mmHg-22 mmHg] 5-6 mmHg CO:  [5.2 L/min-7.2 L/min] 7.2 L/min CI:  [2.3 L/min/m2-3.2 L/min/m2] 3.2 L/min/m2  Complained of severe headache yesterday. Head CT (-).   Resting in bed. Not very interactive. Denies pain.   Objective:    Weight Range: 106.8 kg Body mass index is 33.78 kg/m.   Vital Signs:   Temp:  [93.6 F (34.2 C)-99.1 F (37.3 C)] 97.7 F (36.5 C) (06/27 0701) Pulse Rate:  [55-135] 82 (06/27 0701) Resp:  [11-30] 21 (06/27 0701) BP: (59-144)/(32-91) 101/86 (06/27 0700) SpO2:  [86 %-100 %] 99 % (06/27 0701) Last BM Date : 07/17/23  Weight change: Filed Weights   07/09/23 0636 07/10/23 0349 07/17/23 0615  Weight: 110.2 kg 111.4 kg 106.8 kg   Intake/Output:  Intake/Output Summary (Last 24 hours) at 07/18/2023 0736 Last data filed at 07/18/2023 0700 Gross per 24 hour  Intake 1466.09 ml  Output 3240 ml  Net -1773.91 ml  CVP 5-6  Physical Exam  General:  sleepy appearing.  No respiratory difficulty. + wet cough Neck: supple. JVD flat.  Cor: PMI nondisplaced. Regular rate & rhythm. No rubs, gallops or murmurs. +impella Lungs: coarse on inspiration Extremities: no cyanosis, clubbing, rash, edema  Neuro: alert & oriented x 3. Moves all 4 extremities w/o difficulty. Affect flat.   Telemetry  NSR 80s 0-1 PVC/hr (Personally reviewed)   Labs    CBC Recent Labs    07/17/23 0500 07/18/23 0519  WBC 6.1 5.6  HGB 9.7* 9.9*  HCT 31.6* 32.7*  MCV 86.1 87.4  PLT 87* 98*   Basic  Metabolic Panel Recent Labs    93/73/74 0500 07/17/23 1816 07/18/23 0519  NA 134* 132* 134*  K 3.2* 3.5 5.1  CL 85* 87* 89*  CO2 36* 36* 35*  GLUCOSE 135* 87 97  BUN 21* 19 19  CREATININE 1.07* 1.06* 1.10*  CALCIUM  9.2 8.9 9.4  MG 1.9  --  2.1  PHOS  --  3.6  --    Liver Function Tests Recent Labs    07/17/23 1816  ALBUMIN  2.7*    BNP (last 3 results) Recent Labs    05/28/23 1224 06/30/23 1218 07/04/23 1057  BNP 1,155.3* 600.6* 612.0*   Fasting Lipid Panel Recent Labs    07/17/23 0500  CHOL 109  HDL 47  LDLCALC 54  TRIG 38  CHOLHDL 2.3    Medications:    Scheduled Medications:  acetaminophen   500 mg Oral Q6H   amiodarone   400 mg Oral BID   Chlorhexidine  Gluconate Cloth  6 each Topical Daily   feeding supplement  237 mL Oral TID BM   fluticasone   2 spray Each Nare Daily   furosemide   80 mg Intravenous BID   hydrALAZINE   10 mg Oral Q8H   insulin  aspart  0-9 Units Subcutaneous Q4H   loratadine   10 mg Oral Daily   multivitamin with minerals  1 tablet Oral Daily   pantoprazole   40 mg Oral Daily   polyethylene  glycol  17 g Oral BID   senna-docusate  2 tablet Oral BID   spironolactone   25 mg Oral Daily   Infusions:  heparin  2,250 Units/hr (07/18/23 0600)   milrinone  0.375 mcg/kg/min (07/18/23 0600)   sodium bicarbonate  25 mEq (Impella PURGE) in dextrose  5 % 1000 mL bag     PRN Medications: acetaminophen , albuterol , ALPRAZolam , HYDROmorphone  (DILAUDID ) injection, ondansetron  (ZOFRAN ) IV, mouth rinse, oxyCODONE , oxyCODONE   Patient Profile   48 y.o. female with history of chronic systolic HF with biventricular dysfunction on home inotrope, severe MR, hx PE/DVT, obesity, CAD, anemia, tobacco use. Presented for mTEER on 07/09/23. Worsening MR leading to cardiogenic shock.  Assessment/Plan  Cardiogenic shock: due to torrential MR s/p mTEER complicated by severely restricted posterior leaflet that was damaged during repair attempt, IABP and nipride  needed  to stabilize. Transitioned to Impella 5.5. Consideration for LVAD workup on Monday, but she has significant social concerns. Not a txp candidate with tobacco use.  I do not believe that she would be able to survive without LVAD support, the alternative is comfort care.  - Continue Impella 5.5 at P9  5.2.  - Continue milrinone  to 0.375mcg/kg/min. CO-OX 65%  - See below for diuretic regimen.   Acute on chronic systolic HF with biventricular dysfunction - stage D cardiomyopathy - nonobstructive CAD on cath 2023.  - RHC 5/25 on milrinone  0.25: severe pulmonary venous HTN, severely elevated PCWP with prominent v waves, preserved CO - Continue milrinone  0.375 mcg. CO-OX 65%.  - CVP 5-6. Continue lasix  80 mg twice a day.  - Continue 10 mg hydralazine  three times a day. - needs stabilization and LVAD candidacy determination   Torrential MR - See above - Improved with unloading, extubated 6/20   Acute hypoxic respiratory failure - In setting of acute on chronic CHF and severe/torrential  - On 3 liters [place]. Sats stable. Continue incentive spirometer (encouraged her to use more frequently, last used last night).   Hx of PE/DVT w/ IVC filter - Evaluated at [facility] where thrombophilia work-up was negative (Lupus inhibitor, anticardiolipin antibodies, and anti beta-2  glycoprotein antibodies were negative; protein C activity 85%, protein S activity 76%, factor V Leiden, and prothrombin gene mutation ) - IVC filter in place; could not be removed due to significant thrombus burden on filter at [facility]  - Heparin  gtt for now with Impella and potential procedures   CAD - LHC in 5/23 w/ nonobstructive CAD involving RCA - No aspirin  with need for anticoagulation.  Tobacco use - Smoking 3 cigarettes/day at time of last follow-up.  Anemia  HGb 9.9   Transfuse for HGB < 8. No obvious source.   Headache, Severe - CT yesterday (-)  PVCs - Continue PO amio.   Hyperkalemia - K 5.1 this morning. Was  aggressively repleted yesterday while on lasix  gtt - Continue spiro, tends to fall low - Repeat BMET 1400  VAD work up underway.  Palliative Care consulted.   Length of Stay: 9  CRITICAL CARE Performed by: Beckey LITTIE Coe AGACNP-BC   Total critical care time: 14 minutes  Critical care time was exclusive of separately billable procedures and treating other patients.  Critical care was necessary to treat or prevent imminent or life-threatening deterioration.  Critical care was time spent personally by me on the following activities: development of treatment plan with patient and/or surrogate as well as nursing, discussions with consultants, evaluation of patient's response to treatment, examination of patient, obtaining history from patient or surrogate, ordering and performing  treatments and interventions, ordering and review of laboratory studies, ordering and review of radiographic studies, pulse oximetry and re-evaluation of patient's condition.   Beckey LITTIE Coe, NP  07/18/2023, 7:36 AM  Advanced Heart Failure Team Pager [phone] (M-F; 7a - 5p)  Please contact [facility] for night-coverage after hours (5p -7a ) and weekends on [email or web address]

## 2023-07-18 NOTE — Progress Notes (Signed)
   07/18/23 0401  BiPAP/CPAP/SIPAP  $ Non-Invasive Ventilator  Non-Invasive Vent Subsequent  BiPAP/CPAP/SIPAP Pt Type Adult  BiPAP/CPAP/SIPAP Resmed  Reason BIPAP/CPAP not in use Nausea/Vomiting  BiPAP/CPAP /SiPAP Vitals  Pulse Rate 79  Resp 19  SpO2 98 %  Bilateral Breath Sounds Clear;Diminished

## 2023-07-18 NOTE — Progress Notes (Signed)
 VASCULAR LAB    Pre VAD Dopplers have been performed.  See CV proc for preliminary results.   Edu On, RVT 07/18/2023, 4:11 PM

## 2023-07-19 ENCOUNTER — Inpatient Hospital Stay (HOSPITAL_COMMUNITY)

## 2023-07-19 DIAGNOSIS — I340 Nonrheumatic mitral (valve) insufficiency: Secondary | ICD-10-CM | POA: Diagnosis not present

## 2023-07-19 DIAGNOSIS — R570 Cardiogenic shock: Secondary | ICD-10-CM | POA: Diagnosis not present

## 2023-07-19 LAB — COOXEMETRY PANEL
Carboxyhemoglobin: 2.4 % — ABNORMAL HIGH (ref 0.5–1.5)
Methemoglobin: <0.7 % (ref 0.0–1.5)
O2 Saturation: 58.5 %
Total hemoglobin: 11 g/dL — ABNORMAL LOW (ref 12.0–16.0)

## 2023-07-19 LAB — BASIC METABOLIC PANEL WITH GFR
Anion gap: 11 (ref 5–15)
BUN: 20 mg/dL (ref 6–20)
CO2: 31 mmol/L (ref 22–32)
Calcium: 9.7 mg/dL (ref 8.9–10.3)
Chloride: 89 mmol/L — ABNORMAL LOW (ref 98–111)
Creatinine, Ser: 1.28 mg/dL — ABNORMAL HIGH (ref 0.44–1.00)
GFR, Estimated: 52 mL/min — ABNORMAL LOW (ref >60)
Glucose, Bld: 114 mg/dL — ABNORMAL HIGH (ref 70–99)
Potassium: 4.3 mmol/L (ref 3.5–5.1)
Sodium: 131 mmol/L — ABNORMAL LOW (ref 135–145)

## 2023-07-19 LAB — GLUCOSE, CAPILLARY
Glucose-Capillary: 105 mg/dL — ABNORMAL HIGH (ref 70–99)
Glucose-Capillary: 114 mg/dL — ABNORMAL HIGH (ref 70–99)
Glucose-Capillary: 124 mg/dL — ABNORMAL HIGH (ref 70–99)
Glucose-Capillary: 127 mg/dL — ABNORMAL HIGH (ref 70–99)
Glucose-Capillary: 130 mg/dL — ABNORMAL HIGH (ref 70–99)

## 2023-07-19 LAB — CBC
HCT: 34.8 % — ABNORMAL LOW (ref 36.0–46.0)
Hemoglobin: 10.7 g/dL — ABNORMAL LOW (ref 12.0–15.0)
MCH: 26.4 pg (ref 26.0–34.0)
MCHC: 30.7 g/dL (ref 30.0–36.0)
MCV: 85.9 fL (ref 80.0–100.0)
Platelets: 131 10*3/uL — ABNORMAL LOW (ref 150–400)
RBC: 4.05 MIL/uL (ref 3.87–5.11)
RDW: 21.3 % — ABNORMAL HIGH (ref 11.5–15.5)
WBC: 8.6 10*3/uL (ref 4.0–10.5)
nRBC: 0 % (ref 0.0–0.2)

## 2023-07-19 LAB — HEPARIN LEVEL (UNFRACTIONATED): Heparin Unfractionated: 0.42 [IU]/mL (ref 0.30–0.70)

## 2023-07-19 LAB — MAGNESIUM: Magnesium: 2 mg/dL (ref 1.7–2.4)

## 2023-07-19 LAB — VAS US DOPPLER PRE VAD
Left ABI: ABSENT
Right ABI: ABSENT

## 2023-07-19 LAB — LACTATE DEHYDROGENASE: LDH: 598 U/L — ABNORMAL HIGH (ref 98–192)

## 2023-07-19 MED ORDER — MORPHINE SULFATE (PF) 2 MG/ML IV SOLN
1.0000 mg | INTRAVENOUS | Status: DC | PRN
  Administered 2023-07-19 – 2023-07-23 (×3): 1 mg via INTRAVENOUS
  Filled 2023-07-19 (×3): qty 1

## 2023-07-19 MED ORDER — FUROSEMIDE 10 MG/ML IJ SOLN
40.0000 mg | Freq: Two times a day (BID) | INTRAMUSCULAR | Status: DC
  Administered 2023-07-19 – 2023-07-20 (×2): 40 mg via INTRAVENOUS
  Filled 2023-07-19 (×2): qty 4

## 2023-07-19 MED ORDER — MORPHINE SULFATE (PF) 2 MG/ML IV SOLN
INTRAVENOUS | Status: AC
  Administered 2023-07-19: 1 mg via INTRAVENOUS
  Filled 2023-07-19: qty 1

## 2023-07-19 NOTE — Progress Notes (Signed)
 PHARMACY - ANTICOAGULATION CONSULT NOTE  Pharmacy Consult for heparin  Indication: IABP>impella 5.5 + LLE DVT  Allergies  Allergen Reactions   Sulfa  Antibiotics Itching, Swelling and Rash    Lip swelling    Patient Measurements: Height: 5' 10 (177.8 cm) Weight: 106.8 kg (235 lb 7.2 oz) IBW/kg (Calculated) : 68.5 HEPARIN  DW (KG): 93  Vital Signs: Temp: 98.2 F (36.8 C) (06/28 0545) BP: 101/83 (06/28 0545) Pulse Rate: 82 (06/28 0545)  Labs: Recent Labs    07/17/23 0500 07/17/23 0942 07/17/23 1816 07/18/23 0519 07/18/23 0604 07/18/23 1400 07/19/23 0457  HGB 9.7*  --   --  9.9*  --   --  10.7*  HCT 31.6*  --   --  32.7*  --   --  34.8*  PLT 87*  --   --  98*  --   --  131*  APTT 87* 100*  --   --   --   --   --   LABPROT 15.6*  --   --   --   --   --   --   INR 1.2  --   --   --   --   --   --   HEPARINUNFRC  --  0.41 0.35  --  0.36  --  0.42  CREATININE 1.07*  --  1.06* 1.10*  --  1.18* 1.28*    Estimated Creatinine Clearance: 72.7 mL/min (A) (by C-G formula based on SCr of 1.28 mg/dL (H)).  Assessment: 61 yof who presented for mTEER complicated with torn mitral valve leaflet resulting in torrential MR and cardiogenic shock requiring IABP. On warfarin PTA for hx PE (LD 6/12) - was on enoxaparin  while warfarin on hold for procedure (LD 6/17).  Heparin  infusion for IABP restarted 2 hours after procedure 6/18. Impella placed 6/19.   On 6/27, patient found to have chronic DVT in the left femoral vein and left popliteal vein.   6/28 AM: Heparin  level 0.42, therapeutic on heparin  2250 units/hr. No issues with infusion running or signs of bleeding per RN. Hgb stable at 10.7, PLT slowly increasing to 131. LDH remains elevated at 598, increase from yesterday.  Goal of Therapy:  Heparin  level 0.3-0.5 units/ml Monitor platelets by anticoagulation protocol: Yes   Plan:  Continue heparin  infusion at 2250 units/hr Daily heparin  level and CBC (monitor platelets and  LDH) Monitor for signs/symptoms of bleeding  Morna Breach, PharmD PGY2 Cardiology Pharmacy Resident 07/19/2023 6:13 AM  [facility] pharmacy phone numbers are listed on [email or web address]

## 2023-07-19 NOTE — Plan of Care (Signed)

## 2023-07-19 NOTE — Progress Notes (Signed)
 Advanced Heart Failure Rounding Note  Cardiologist: Ria Commander, DO  Chief Complaint: Cardiogenic Shock Subjective:    6/17: mTEER with sev posterior leaflet restriction resulting in tear. IABP + PAC. PCWP 40, v up to 60 6/24 : Aggressive diuresed. Started on lasix  drip. Brisk diuresis noted.   - PA 80s-70s/30s, CVP 8, BP 121/75 (88).   Objective:    Weight Range: 106.8 kg Body mass index is 33.78 kg/m.   Vital Signs:   Temp:  [97.7 F (36.5 C)-99.1 F (37.3 C)] 98.4 F (36.9 C) (06/28 0915) Pulse Rate:  [72-233] 84 (06/28 0915) Resp:  [14-32] 29 (06/28 0915) BP: (49-136)/(32-121) 105/92 (06/28 0915) SpO2:  [79 %-100 %] 97 % (06/28 0915) Last BM Date : 07/18/23  Weight change: Filed Weights   07/09/23 0636 07/10/23 0349 07/17/23 0615  Weight: 110.2 kg 111.4 kg 106.8 kg   Intake/Output:  Intake/Output Summary (Last 24 hours) at 07/19/2023 0937 Last data filed at 07/19/2023 0900 Gross per 24 hour  Intake 1209.33 ml  Output 3130 ml  Net -1920.67 ml   Physical Exam   Vitals:   07/19/23 0903 07/19/23 0915  BP: 110/88 (!) 105/92  Pulse:  84  Resp:  (!) 29  Temp:  98.4 F (36.9 C)  SpO2:  97%   GENERAL: chronically ill appearing Lungs- normal work of breathing CARDIAC:  JVP: 9 cm          RRR; no murmurs.  ABDOMEN: Soft, non-tender, non-distended.  EXTREMITIES: Warm and well perfused.  NEUROLOGIC: No obvious FND  Telemetry  NSR 80s  Labs    CBC Recent Labs    07/18/23 0519 07/19/23 0457  WBC 5.6 8.6  HGB 9.9* 10.7*  HCT 32.7* 34.8*  MCV 87.4 85.9  PLT 98* 131*   Basic Metabolic Panel Recent Labs    93/73/74 1816 07/18/23 0519 07/18/23 1400 07/19/23 0457  NA 132* 134* 133* 131*  K 3.5 5.1 4.2 4.3  CL 87* 89* 89* 89*  CO2 36* 35* 31 31  GLUCOSE 87 97 93 114*  BUN 19 19 19 20   CREATININE 1.06* 1.10* 1.18* 1.28*  CALCIUM  8.9 9.4 9.5 9.7  MG  --  2.1  --  2.0  PHOS 3.6  --   --   --    Liver Function Tests Recent Labs     07/17/23 1816  ALBUMIN  2.7*    BNP (last 3 results) Recent Labs    05/28/23 1224 06/30/23 1218 07/04/23 1057  BNP 1,155.3* 600.6* 612.0*   Fasting Lipid Panel Recent Labs    07/17/23 0500  CHOL 109  HDL 47  LDLCALC 54  TRIG 38  CHOLHDL 2.3    Medications:    Scheduled Medications:  acetaminophen   500 mg Oral Q6H   amiodarone   400 mg Oral BID   Chlorhexidine  Gluconate Cloth  6 each Topical Daily   feeding supplement  237 mL Oral TID BM   fluticasone   2 spray Each Nare Daily   furosemide   80 mg Intravenous BID   hydrALAZINE   10 mg Oral Q8H   insulin  aspart  0-9 Units Subcutaneous Q4H   loratadine   10 mg Oral Daily   multivitamin with minerals  1 tablet Oral Daily   pantoprazole   40 mg Oral Daily   polyethylene glycol  17 g Oral BID   senna-docusate  2 tablet Oral BID   spironolactone   25 mg Oral Daily   Infusions:  heparin  2,250 Units/hr (07/19/23 0900)  milrinone  0.25 mcg/kg/min (07/19/23 0900)   sodium bicarbonate  25 mEq (Impella PURGE) in dextrose  5 % 1000 mL bag     PRN Medications: acetaminophen , albuterol , ALPRAZolam , morphine  injection, ondansetron  (ZOFRAN ) IV, mouth rinse, oxyCODONE , oxyCODONE   Patient Profile   48 y.o. female with history of chronic systolic HF with biventricular dysfunction on home inotrope, severe MR, hx PE/DVT, obesity, CAD, anemia, tobacco use. Presented for mTEER on 07/09/23. Worsening MR leading to cardiogenic shock.  Assessment/Plan  Cardiogenic shock: due to torrential MR s/p mTEER complicated by severely restricted posterior leaflet that was damaged during repair attempt, IABP and nipride  needed to stabilize. Transitioned to Impella 5.5. Consideration for LVAD workup on Monday, but she has significant social concerns. Not a txp candidate with tobacco use.  I do not believe that she would be able to survive without LVAD support, the alternative is comfort care.  - Impella 5.5 at P9 flowing 5-5.3LPM, CVP 7-10, PA 80s/30s with MAP  of 88.  - 3.1L urine output yesterday, negative 2L.  - sCr up to 1.28 from 1.18. Will decrease lasix  to 40mg  IV BID. Received 80 this AM.  - Continue hydralazine  10mg  TID for afterload reduction.  - RV function has improved on TTE. Discussing case with CTS; may possibly require MVR at the time of VAD. In addition she will require multiple teeth to be extracted which cannot be done for another week due to lack of inpatient dentistry next week. Will continue aggressive PT with plan to discuss at [facility] within the next week.  - PFTs with severely reduced volumes. I feel this is out of proportion to her clinically. I suspect this may be due to poor effort and weakness. Will repeat. In addition, CT chest does not demonstrating significant parenchymal lung disease.   Acute on chronic systolic HF with biventricular dysfunction - stage D cardiomyopathy - nonobstructive CAD on cath 2023.  - RHC 5/25 on milrinone  0.25: severe pulmonary venous HTN, severely elevated PCWP with prominent v waves, preserved CO - Milrinone  weaned down to 0.10mcg/kg/min; PA pressures are higher secondary to elevated post cap filling pressures; PA diastolic in the 30s.  - IV lasix  40mg  BID.    Torrential MR - See above - Improved with unloading, extubated 6/20   Acute hypoxic respiratory failure - In setting of acute on chronic CHF and severe/torrential  - see above  Hx of PE/DVT w/ IVC filter - Evaluated at [facility] where thrombophilia work-up was negative (Lupus inhibitor, anticardiolipin antibodies, and anti beta-2  glycoprotein antibodies were negative; protein C activity 85%, protein S activity 76%, factor V Leiden, and prothrombin gene mutation ) - IVC filter in place; could not be removed due to significant thrombus burden on filter at [facility]  - Heparin  gtt for now with Impella and potential procedures   CAD - LHC in 5/23 w/ nonobstructive CAD involving RCA - No aspirin  with need for anticoagulation.  Tobacco use - Smoking  3 cigarettes/day at time of last follow-up.  Anemia  - Hgb stable - Transfuse for HGB < 8. No obvious source.   Headache, Severe - CT yesterday (-)  PVCs - Continue PO amio.   Hyperkalemia - K 5.1 this morning. Was aggressively repleted yesterday while on lasix  gtt - Continue spiro, tends to fall low - Repeat BMET 1400  VAD work up underway.  Palliative Care consulted.   Length of Stay: 10  Ria Commander, DO  07/19/2023, 9:37 AM  Advanced Heart Failure Team Pager [phone] (M-F; 7a - 5p)  Please contact [facility] for night-coverage after hours (5p -7a ) and weekends on [email or web address]  CRITICAL CARE Performed by: Ria Commander   Total critical care time: 40 minutes  Critical care time was exclusive of separately billable procedures and treating other patients.  Critical care was necessary to treat or prevent imminent or life-threatening deterioration.  Critical care was time spent personally by me on the following activities: development of treatment plan with patient and/or surrogate as well as nursing, discussions with consultants, evaluation of patient's response to treatment, examination of patient, obtaining history from patient or surrogate, ordering and performing treatments and interventions, ordering and review of laboratory studies, ordering and review of radiographic studies, pulse oximetry and re-evaluation of patient's condition.

## 2023-07-20 DIAGNOSIS — R570 Cardiogenic shock: Secondary | ICD-10-CM | POA: Diagnosis not present

## 2023-07-20 DIAGNOSIS — I340 Nonrheumatic mitral (valve) insufficiency: Secondary | ICD-10-CM | POA: Diagnosis not present

## 2023-07-20 LAB — BASIC METABOLIC PANEL WITH GFR
Anion gap: 12 (ref 5–15)
BUN: 22 mg/dL — ABNORMAL HIGH (ref 6–20)
CO2: 29 mmol/L (ref 22–32)
Calcium: 9.3 mg/dL (ref 8.9–10.3)
Chloride: 88 mmol/L — ABNORMAL LOW (ref 98–111)
Creatinine, Ser: 1.16 mg/dL — ABNORMAL HIGH (ref 0.44–1.00)
GFR, Estimated: 59 mL/min — ABNORMAL LOW (ref >60)
Glucose, Bld: 94 mg/dL (ref 70–99)
Potassium: 3.8 mmol/L (ref 3.5–5.1)
Sodium: 129 mmol/L — ABNORMAL LOW (ref 135–145)

## 2023-07-20 LAB — COOXEMETRY PANEL
Carboxyhemoglobin: 2 % — ABNORMAL HIGH (ref 0.5–1.5)
Methemoglobin: <0.7 % (ref 0.0–1.5)
O2 Saturation: 66.4 %
Total hemoglobin: 9.9 g/dL — ABNORMAL LOW (ref 12.0–16.0)

## 2023-07-20 LAB — CBC
HCT: 30.8 % — ABNORMAL LOW (ref 36.0–46.0)
Hemoglobin: 9.4 g/dL — ABNORMAL LOW (ref 12.0–15.0)
MCH: 26.7 pg (ref 26.0–34.0)
MCHC: 30.5 g/dL (ref 30.0–36.0)
MCV: 87.5 fL (ref 80.0–100.0)
Platelets: 109 10*3/uL — ABNORMAL LOW (ref 150–400)
RBC: 3.52 MIL/uL — ABNORMAL LOW (ref 3.87–5.11)
RDW: 21.5 % — ABNORMAL HIGH (ref 11.5–15.5)
WBC: 6.9 10*3/uL (ref 4.0–10.5)
nRBC: 0 % (ref 0.0–0.2)

## 2023-07-20 LAB — GLUCOSE, CAPILLARY
Glucose-Capillary: 111 mg/dL — ABNORMAL HIGH (ref 70–99)
Glucose-Capillary: 144 mg/dL — ABNORMAL HIGH (ref 70–99)
Glucose-Capillary: 120 mg/dL — ABNORMAL HIGH (ref 70–99)

## 2023-07-20 LAB — LACTATE DEHYDROGENASE: LDH: 552 U/L — ABNORMAL HIGH (ref 98–192)

## 2023-07-20 LAB — HEPARIN LEVEL (UNFRACTIONATED): Heparin Unfractionated: 0.44 [IU]/mL (ref 0.30–0.70)

## 2023-07-20 LAB — MAGNESIUM: Magnesium: 2.1 mg/dL (ref 1.7–2.4)

## 2023-07-20 MED ORDER — SALINE SPRAY 0.65 % NA SOLN
1.0000 | NASAL | Status: DC | PRN
  Administered 2023-07-20: 1 via NASAL
  Filled 2023-07-20: qty 44

## 2023-07-20 MED ORDER — FUROSEMIDE 10 MG/ML IJ SOLN
60.0000 mg | Freq: Two times a day (BID) | INTRAMUSCULAR | Status: DC
  Administered 2023-07-20: 60 mg via INTRAVENOUS
  Filled 2023-07-20: qty 6

## 2023-07-20 MED ORDER — POTASSIUM CHLORIDE CRYS ER 20 MEQ PO TBCR
40.0000 meq | EXTENDED_RELEASE_TABLET | Freq: Once | ORAL | Status: AC
  Administered 2023-07-20: 40 meq via ORAL
  Filled 2023-07-20: qty 2

## 2023-07-20 NOTE — Progress Notes (Signed)
 PHARMACY - ANTICOAGULATION CONSULT NOTE  Pharmacy Consult for heparin  Indication: IABP>impella 5.5 + LLE DVT  Allergies  Allergen Reactions   Sulfa  Antibiotics Itching, Swelling and Rash    Lip swelling    Patient Measurements: Height: 5' 10 (177.8 cm) Weight: 106.8 kg (235 lb 7.2 oz) IBW/kg (Calculated) : 68.5 HEPARIN  DW (KG): 93  Vital Signs: Temp: 97.9 F (36.6 C) (06/29 0600) BP: 90/77 (06/29 0545) Pulse Rate: 79 (06/29 0600)  Labs: Recent Labs    07/17/23 0942 07/17/23 1816 07/18/23 0519 07/18/23 0604 07/18/23 1400 07/19/23 0457 07/20/23 0520  HGB  --    < > 9.9*  --   --  10.7* 9.4*  HCT  --   --  32.7*  --   --  34.8* 30.8*  PLT  --   --  98*  --   --  131* 109*  APTT 100*  --   --   --   --   --   --   HEPARINUNFRC 0.41   < >  --  0.36  --  0.42 0.44  CREATININE  --    < > 1.10*  --  1.18* 1.28* 1.16*   < > = values in this interval not displayed.    Estimated Creatinine Clearance: 80.2 mL/min (A) (by C-G formula based on SCr of 1.16 mg/dL (H)).  Assessment: 45 yof who presented for mTEER complicated with torn mitral valve leaflet resulting in torrential MR and cardiogenic shock requiring IABP. On warfarin PTA for hx PE (LD 6/12) - was on enoxaparin  while warfarin on hold for procedure (LD 6/17).  Heparin  infusion for IABP restarted 2 hours after procedure 6/18. Impella placed 6/19.   On 6/27, patient found to have chronic DVT in the left femoral vein and left popliteal vein.   6/29 AM: Heparin  level 0.44, therapeutic on heparin  2250 units/hr. No issues with infusion running or signs of bleeding per RN. Hgb stable at 9.4, PLT stable at 109. LDH remains elevated at 552, slight decrease from yesterday.  Goal of Therapy:  Heparin  level 0.3-0.5 units/ml Monitor platelets by anticoagulation protocol: Yes   Plan:  Continue heparin  infusion at 2250 units/hr Daily heparin  level and CBC (monitor platelets and LDH) Monitor for signs/symptoms of  bleeding  Morna Breach, PharmD PGY2 Cardiology Pharmacy Resident 07/20/2023 6:17 AM  [facility] pharmacy phone numbers are listed on [email or web address]

## 2023-07-20 NOTE — Plan of Care (Signed)

## 2023-07-20 NOTE — Progress Notes (Signed)
 Advanced Heart Failure Rounding Note  Cardiologist: Ria Commander, DO  Chief Complaint: Cardiogenic Shock Subjective:    6/17: mTEER with sev posterior leaflet restriction resulting in tear. IABP + PAC. PCWP 40, v up to 60 6/24 : Aggressive diuresed. Started on lasix  drip. Brisk diuresis noted.   - PA 70s/50s - 1.5L urine output  Objective:    Weight Range: 106.8 kg Body mass index is 33.78 kg/m.   Vital Signs:   Temp:  [97.9 F (36.6 C)-99 F (37.2 C)] 98.8 F (37.1 C) (06/29 1100) Pulse Rate:  [77-231] 91 (06/29 1100) Resp:  [16-43] 43 (06/29 1100) BP: (79-128)/(54-102) 104/91 (06/29 1100) SpO2:  [60 %-100 %] 76 % (06/29 1100) Last BM Date : 07/19/23  Weight change: Filed Weights   07/09/23 0636 07/10/23 0349 07/17/23 0615  Weight: 110.2 kg 111.4 kg 106.8 kg   Intake/Output:  Intake/Output Summary (Last 24 hours) at 07/20/2023 1151 Last data filed at 07/20/2023 1100 Gross per 24 hour  Intake 1054.91 ml  Output 1510 ml  Net -455.09 ml   Physical Exam   Vitals:   07/20/23 1000 07/20/23 1100  BP: 95/78 (!) 104/91  Pulse: (!) 155 91  Resp: 19 (!) 43  Temp: 98.1 F (36.7 C) 98.8 F (37.1 C)  SpO2: 96% (!) 76%   GENERAL: NAD Lungs- normal work of breathing CARDIAC:  JVP: 7         RRR ABDOMEN: Soft, non-tender, non-distended.  EXTREMITIES: warm; impella 5.5 access site without hematoma NEUROLOGIC: No obvious FND  Telemetry  NSR 80s-90s  Labs    CBC Recent Labs    07/19/23 0457 07/20/23 0520  WBC 8.6 6.9  HGB 10.7* 9.4*  HCT 34.8* 30.8*  MCV 85.9 87.5  PLT 131* 109*   Basic Metabolic Panel Recent Labs    93/73/74 1816 07/18/23 0519 07/19/23 0457 07/20/23 0520  NA 132*   < > 131* 129*  K 3.5   < > 4.3 3.8  CL 87*   < > 89* 88*  CO2 36*   < > 31 29  GLUCOSE 87   < > 114* 94  BUN 19   < > 20 22*  CREATININE 1.06*   < > 1.28* 1.16*  CALCIUM  8.9   < > 9.7 9.3  MG  --    < > 2.0 2.1  PHOS 3.6  --   --   --    < > = values in  this interval not displayed.   Liver Function Tests Recent Labs    07/17/23 1816  ALBUMIN  2.7*    BNP (last 3 results) Recent Labs    05/28/23 1224 06/30/23 1218 07/04/23 1057  BNP 1,155.3* 600.6* 612.0*   Fasting Lipid Panel No results for input(s): CHOL, HDL, LDLCALC, TRIG, CHOLHDL, LDLDIRECT in the last 72 hours.   Medications:    Scheduled Medications:  acetaminophen   500 mg Oral Q6H   amiodarone   400 mg Oral BID   Chlorhexidine  Gluconate Cloth  6 each Topical Daily   feeding supplement  237 mL Oral TID BM   fluticasone   2 spray Each Nare Daily   furosemide   40 mg Intravenous BID   hydrALAZINE   10 mg Oral Q8H   insulin  aspart  0-9 Units Subcutaneous Q4H   loratadine   10 mg Oral Daily   multivitamin with minerals  1 tablet Oral Daily   pantoprazole   40 mg Oral Daily   polyethylene glycol  17 g Oral BID  senna-docusate  2 tablet Oral BID   spironolactone   25 mg Oral Daily   Infusions:  heparin  2,250 Units/hr (07/20/23 1100)   milrinone  0.25 mcg/kg/min (07/20/23 1100)   sodium bicarbonate  25 mEq (Impella PURGE) in dextrose  5 % 1000 mL bag     PRN Medications: acetaminophen , albuterol , ALPRAZolam , morphine  injection, ondansetron  (ZOFRAN ) IV, mouth rinse, oxyCODONE , oxyCODONE   Patient Profile   48 y.o. female with history of chronic systolic HF with biventricular dysfunction on home inotrope, severe MR, hx PE/DVT, obesity, CAD, anemia, tobacco use. Presented for mTEER on 07/09/23. Worsening MR leading to cardiogenic shock.  Assessment/Plan  Cardiogenic shock: due to torrential MR s/p mTEER complicated by severely restricted posterior leaflet that was damaged during repair attempt, IABP and nipride  needed to stabilize. Transitioned to Impella 5.5. Consideration for LVAD workup on Monday, but she has significant social concerns. Not a txp candidate with tobacco use.  I do not believe that she would be able to survive without LVAD support, the alternative  is comfort care.  - Impella 5.5 at P9 flowing 5.4LPM - 1.5L urine output with improvement in sCr to 1.16 - Mixed venous 66% - continue hydralazine  10mg  TID - She has made significant progress over the past 3 days. She is now rapidly walking circles around the unit; awake/alert, communicating with staff. RV function has also improved by TTE.  - Increase lasix  to 60mg  BID.  - RV function has improved on TTE. Discussing case with CTS; may possibly require MVR at the time of VAD. In addition she will require multiple teeth to be extracted which cannot be done for another week due to lack of inpatient dentistry next week. Will continue aggressive PT with plan to discuss at [facility] within the next week.  - PFTs with severely reduced volumes. I feel this is out of proportion to her clinically. I suspect this may be due to poor effort and weakness. Will repeat. In addition, CT chest does not demonstrating significant parenchymal lung disease.   Acute on chronic systolic HF with biventricular dysfunction - stage D cardiomyopathy - nonobstructive CAD on cath 2023.  - RHC 5/25 on milrinone  0.25: severe pulmonary venous HTN, severely elevated PCWP with prominent v waves, preserved CO - I think she can come off milrinone  from a RV standpoint; however, I worry about her elevated PA pressures. Will continue 0.25mcg/kg/min; mixed venous 66%.    Torrential MR - See above - Improved with unloading, extubated 6/20   Acute hypoxic respiratory failure - In setting of acute on chronic CHF and severe/torrential  - see above  Hx of PE/DVT w/ IVC filter - Evaluated at [facility] where thrombophilia work-up was negative (Lupus inhibitor, anticardiolipin antibodies, and anti beta-2  glycoprotein antibodies were negative; protein C activity 85%, protein S activity 76%, factor V Leiden, and prothrombin gene mutation ) - IVC filter in place; could not be removed due to significant thrombus burden on filter at [facility]  - Heparin  gtt  for now with Impella and potential procedures   CAD - LHC in 5/23 w/ nonobstructive CAD involving RCA - No aspirin  with need for anticoagulation.  Tobacco use - Smoking 3 cigarettes/day at time of last follow-up.  Anemia  - Hgb stable - Transfuse for HGB < 8. No obvious source.   Headache, Severe - CT yesterday (-)  PVCs - Continue PO amio.   Hyperkalemia - stable.   VAD work up underway.  Palliative Care consulted.   Length of Stay: 11  Josefita Weissmann, DO  07/20/2023, 11:51 AM  Advanced Heart Failure Team Pager [phone] (M-F; 7a - 5p)  Please contact [facility] for night-coverage after hours (5p -7a ) and weekends on [email or web address]  CRITICAL CARE Performed by: Ria Commander   Total critical care time: 40 minutes  Critical care time was exclusive of separately billable procedures and treating other patients.  Critical care was necessary to treat or prevent imminent or life-threatening deterioration.  Critical care was time spent personally by me on the following activities: development of treatment plan with patient and/or surrogate as well as nursing, discussions with consultants, evaluation of patient's response to treatment, examination of patient, obtaining history from patient or surrogate, ordering and performing treatments and interventions, ordering and review of laboratory studies, ordering and review of radiographic studies, pulse oximetry and re-evaluation of patient's condition.

## 2023-07-21 ENCOUNTER — Inpatient Hospital Stay (HOSPITAL_COMMUNITY)

## 2023-07-21 DIAGNOSIS — I5023 Acute on chronic systolic (congestive) heart failure: Secondary | ICD-10-CM

## 2023-07-21 DIAGNOSIS — I428 Other cardiomyopathies: Secondary | ICD-10-CM

## 2023-07-21 DIAGNOSIS — I5020 Unspecified systolic (congestive) heart failure: Secondary | ICD-10-CM | POA: Diagnosis not present

## 2023-07-21 DIAGNOSIS — I5022 Chronic systolic (congestive) heart failure: Secondary | ICD-10-CM

## 2023-07-21 DIAGNOSIS — I2510 Atherosclerotic heart disease of native coronary artery without angina pectoris: Secondary | ICD-10-CM

## 2023-07-21 DIAGNOSIS — G4733 Obstructive sleep apnea (adult) (pediatric): Secondary | ICD-10-CM

## 2023-07-21 DIAGNOSIS — Z95811 Presence of heart assist device: Secondary | ICD-10-CM

## 2023-07-21 DIAGNOSIS — Z95828 Presence of other vascular implants and grafts: Secondary | ICD-10-CM

## 2023-07-21 DIAGNOSIS — R570 Cardiogenic shock: Secondary | ICD-10-CM | POA: Diagnosis not present

## 2023-07-21 DIAGNOSIS — Z7189 Other specified counseling: Secondary | ICD-10-CM | POA: Diagnosis not present

## 2023-07-21 DIAGNOSIS — I5084 End stage heart failure: Secondary | ICD-10-CM

## 2023-07-21 DIAGNOSIS — I5082 Biventricular heart failure: Secondary | ICD-10-CM

## 2023-07-21 DIAGNOSIS — Z515 Encounter for palliative care: Secondary | ICD-10-CM | POA: Diagnosis not present

## 2023-07-21 DIAGNOSIS — Z86718 Personal history of other venous thrombosis and embolism: Secondary | ICD-10-CM

## 2023-07-21 DIAGNOSIS — I340 Nonrheumatic mitral (valve) insufficiency: Secondary | ICD-10-CM | POA: Diagnosis not present

## 2023-07-21 DIAGNOSIS — I110 Hypertensive heart disease with heart failure: Secondary | ICD-10-CM

## 2023-07-21 DIAGNOSIS — I272 Pulmonary hypertension, unspecified: Secondary | ICD-10-CM

## 2023-07-21 LAB — CBC
HCT: 30.2 % — ABNORMAL LOW (ref 36.0–46.0)
Hemoglobin: 9.3 g/dL — ABNORMAL LOW (ref 12.0–15.0)
MCH: 26.8 pg (ref 26.0–34.0)
MCHC: 30.8 g/dL (ref 30.0–36.0)
MCV: 87 fL (ref 80.0–100.0)
Platelets: 110 10*3/uL — ABNORMAL LOW (ref 150–400)
RBC: 3.47 MIL/uL — ABNORMAL LOW (ref 3.87–5.11)
RDW: 21.6 % — ABNORMAL HIGH (ref 11.5–15.5)
WBC: 7.7 10*3/uL (ref 4.0–10.5)
nRBC: 0 % (ref 0.0–0.2)

## 2023-07-21 LAB — BASIC METABOLIC PANEL WITH GFR
Anion gap: 11 (ref 5–15)
BUN: 22 mg/dL — ABNORMAL HIGH (ref 6–20)
CO2: 29 mmol/L (ref 22–32)
Calcium: 9.3 mg/dL (ref 8.9–10.3)
Chloride: 90 mmol/L — ABNORMAL LOW (ref 98–111)
Creatinine, Ser: 1.17 mg/dL — ABNORMAL HIGH (ref 0.44–1.00)
GFR, Estimated: 58 mL/min — ABNORMAL LOW (ref >60)
Glucose, Bld: 112 mg/dL — ABNORMAL HIGH (ref 70–99)
Potassium: 3.9 mmol/L (ref 3.5–5.1)
Sodium: 130 mmol/L — ABNORMAL LOW (ref 135–145)

## 2023-07-21 LAB — GLUCOSE, CAPILLARY
Glucose-Capillary: 146 mg/dL — ABNORMAL HIGH (ref 70–99)
Glucose-Capillary: 104 mg/dL — ABNORMAL HIGH (ref 70–99)
Glucose-Capillary: 126 mg/dL — ABNORMAL HIGH (ref 70–99)
Glucose-Capillary: 106 mg/dL — ABNORMAL HIGH (ref 70–99)
Glucose-Capillary: 110 mg/dL — ABNORMAL HIGH (ref 70–99)
Glucose-Capillary: 170 mg/dL — ABNORMAL HIGH (ref 70–99)
Glucose-Capillary: 123 mg/dL — ABNORMAL HIGH (ref 70–99)
Glucose-Capillary: 234 mg/dL — ABNORMAL HIGH (ref 70–99)
Glucose-Capillary: 127 mg/dL — ABNORMAL HIGH (ref 70–99)
Glucose-Capillary: 119 mg/dL — ABNORMAL HIGH (ref 70–99)

## 2023-07-21 LAB — COOXEMETRY PANEL
Carboxyhemoglobin: 2.4 % — ABNORMAL HIGH (ref 0.5–1.5)
Carboxyhemoglobin: 2.8 % — ABNORMAL HIGH (ref 0.5–1.5)
Methemoglobin: 0.9 % (ref 0.0–1.5)
Methemoglobin: 0.9 % (ref 0.0–1.5)
O2 Saturation: 53.3 %
O2 Saturation: 53.8 %
Total hemoglobin: 9.9 g/dL — ABNORMAL LOW (ref 12.0–16.0)
Total hemoglobin: 9.6 g/dL — ABNORMAL LOW (ref 12.0–16.0)

## 2023-07-21 LAB — ECHOCARDIOGRAM LIMITED
Height: 70 in
Weight: 3767.22 [oz_av]

## 2023-07-21 LAB — MAGNESIUM: Magnesium: 2.1 mg/dL (ref 1.7–2.4)

## 2023-07-21 LAB — LUPUS ANTICOAGULANT PANEL
DRVVT: 58.3 s — ABNORMAL HIGH (ref 0.0–47.0)
PTT Lupus Anticoagulant: 46.4 s — ABNORMAL HIGH (ref 0.0–43.5)

## 2023-07-21 LAB — LACTATE DEHYDROGENASE: LDH: 578 U/L — ABNORMAL HIGH (ref 98–192)

## 2023-07-21 LAB — PTT-LA MIX: PTT-LA Mix: 43.7 s — ABNORMAL HIGH (ref 0.0–40.5)

## 2023-07-21 LAB — DRVVT CONFIRM: dRVVT Confirm: 1.1 ratio (ref 0.8–1.2)

## 2023-07-21 LAB — DRVVT MIX: dRVVT Mix: 45.6 s — ABNORMAL HIGH (ref 0.0–40.4)

## 2023-07-21 LAB — HEXAGONAL PHASE PHOSPHOLIPID: Hexagonal Phase Phospholipid: 6 s (ref 0–11)

## 2023-07-21 LAB — HEPARIN LEVEL (UNFRACTIONATED): Heparin Unfractionated: 0.26 [IU]/mL — ABNORMAL LOW (ref 0.30–0.70)

## 2023-07-21 MED ORDER — METOLAZONE 2.5 MG PO TABS
2.5000 mg | ORAL_TABLET | Freq: Once | ORAL | Status: AC
  Administered 2023-07-21: 2.5 mg via ORAL
  Filled 2023-07-21: qty 1

## 2023-07-21 MED ORDER — FUROSEMIDE 10 MG/ML IJ SOLN
80.0000 mg | Freq: Two times a day (BID) | INTRAMUSCULAR | Status: DC
  Administered 2023-07-21 – 2023-07-23 (×5): 80 mg via INTRAVENOUS
  Filled 2023-07-21 (×5): qty 8

## 2023-07-21 MED ORDER — POTASSIUM CHLORIDE CRYS ER 20 MEQ PO TBCR
40.0000 meq | EXTENDED_RELEASE_TABLET | Freq: Two times a day (BID) | ORAL | Status: AC
  Administered 2023-07-21 (×2): 40 meq via ORAL
  Filled 2023-07-21 (×2): qty 2

## 2023-07-21 NOTE — Plan of Care (Signed)

## 2023-07-21 NOTE — Progress Notes (Signed)
 PHARMACY - ANTICOAGULATION CONSULT NOTE  Pharmacy Consult for heparin  Indication: IABP>impella 5.5 + LLE DVT  Allergies  Allergen Reactions   Sulfa  Antibiotics Itching, Swelling and Rash    Lip swelling    Patient Measurements: Height: 5' 10 (177.8 cm) Weight: 106.8 kg (235 lb 7.2 oz) IBW/kg (Calculated) : 68.5 HEPARIN  DW (KG): 93  Vital Signs: Temp: 98.4 F (36.9 C) (06/30 0900) Temp Source: Core (06/30 1000) BP: 101/55 (06/30 0906) Pulse Rate: 85 (06/30 0800)  Labs: Recent Labs    07/19/23 0457 07/20/23 0520 07/21/23 0521  HGB 10.7* 9.4* 9.3*  HCT 34.8* 30.8* 30.2*  PLT 131* 109* 110*  HEPARINUNFRC 0.42 0.44 0.26*  CREATININE 1.28* 1.16* 1.17*    Estimated Creatinine Clearance: 79.5 mL/min (A) (by C-G formula based on SCr of 1.17 mg/dL (H)).  Assessment: 22 yof who presented for mTEER complicated with torn mitral valve leaflet resulting in torrential MR and cardiogenic shock requiring IABP. On warfarin PTA for hx PE (LD 6/12) - was on enoxaparin  while warfarin on hold for procedure (LD 6/17).  Heparin  infusion for IABP restarted 2 hours after procedure 6/18. Impella placed 6/19.   On 6/27, patient found to have chronic DVT in the left femoral vein and left popliteal vein.    Heparin  level 0.26 close to therapeutic on heparin  2250 units/hr. Heparin  running into central line - pause to drawn labs also from central line - expect some fluctuations - no PIV access.  No issues with infusion running or signs of bleeding per RN. Hgb stable at 9s  PLT stable at 100s. LDH elevated but stable 500s.  Goal of Therapy:  Heparin  level 0.3-0.5 units/ml Monitor platelets by anticoagulation protocol: Yes   Plan:  Continue heparin  infusion at 2250 units/hr Daily heparin  level and CBC (monitor platelets and LDH) Monitor for signs/symptoms of bleeding    Olam Chalk Pharm.D. CPP, BCPS Clinical Pharmacist [phone] 07/21/2023 11:52 AM   [facility] pharmacy phone numbers are  listed on [email or web address]

## 2023-07-21 NOTE — Consult Note (Signed)
 [address] [place] [address]             [phone]    Cardiothoracic Surgery Consultation  Reason for Consult: End stage heart failure and severe mitral regurgitation Referring Physician: Dr. Ezra Shuck  [name] [name] is an 48 y.o. female.  HPI:   The patient is a 48 year old woman with a history of hypertension, OSA on CPAP, prior DVT and PE with a vena cava filter, nonobstructive coronary disease, nonischemic cardiomyopathy and severe mitral regurgitation who underwent MTEER on 07/09/2023 with severe residual mitral regurgitation and cardiogenic shock.  She was stabilized with an intra-aortic balloon pump and milrinone .  She underwent implantation of an Impella 5.5 via the left axillary artery on 07/10/2023 and attempt to stabilize her further before consideration of permanent LVAD therapy.  Despite this she is continue to have severe pulmonary hypertension in the range of 80's/30's with a CVP of 15 this morning.  Cardiac index was 2.3 this morning with a Co-ox of 54%.  A 2D echocardiogram this morning showed left ventricular ejection fraction of 25% with global hypokinesis.  There is severe mitral regurgitation.  There is mild to moderate tricuspid regurgitation.  The right ventricle is mildly enlarged with moderate RV systolic dysfunction.  She has been up ambulating around the ICU with the Impella today. Past Medical History:  Diagnosis Date   Anemia    Blood transfusion without reported diagnosis    CAD (coronary artery disease)    Nonobstructive   Closed left ankle fracture    August 30 2012   Clotting disorder [facility])    DVT (deep venous thrombosis) (HCC)    L leg   Fibroid tumor    HFrEF (heart failure with reduced ejection fraction) (HCC)    Hidradenitis    Hypertension    Mitral regurgitation    Myocardial infarction (HCC)    NICM (nonischemic cardiomyopathy) (HCC)    NSVT (nonsustained ventricular tachycardia) (HCC)    Obesity    OSA  on CPAP    Pulmonary embolism (HCC) 04/2013   Sleep apnea     Past Surgical History:  Procedure Laterality Date   ABSCESS DRAINAGE Bilateral 01/10/2015   BREAST BIOPSY Right 02/01/2020   benign   COLONOSCOPY WITH PROPOFOL  N/A 06/08/2018   Procedure: COLONOSCOPY WITH PROPOFOL ;  Surgeon: Shaaron Lamar HERO, MD;  Location: [facility];  Service: Endoscopy;  Laterality: N/A;  2:30pm   CYST REMOVAL TRUNK     IABP INSERTION N/A 07/09/2023   Procedure: IABP Insertion;  Surgeon: Wonda Sharper, MD;  Location: [facility] INVASIVE CV LAB;  Service: Cardiovascular;  Laterality: N/A;   INTRAOPERATIVE TRANSESOPHAGEAL ECHOCARDIOGRAM N/A 07/10/2023   Procedure: ECHOCARDIOGRAM, TRANSESOPHAGEAL, INTRAOPERATIVE;  Surgeon: Lucas Dorise POUR, MD;  Location: [facility] OR;  Service: Open Heart Surgery;  Laterality: N/A;   IR FLUORO GUIDE CV LINE RIGHT  05/16/2023   IR US  GUIDE VASC ACCESS RIGHT  05/16/2023   IVC FILTER PLACEMENT ([facility] HX)     LOWER EXTREMITY VENOGRAPHY N/A 02/07/2021   Procedure: LOWER EXTREMITY VENOGRAPHY;  Surgeon: Lanis Fonda BRAVO, MD;  Location: [facility] INVASIVE CV LAB;  Service: Cardiovascular;  Laterality: N/A;   PERIPHERAL VASCULAR THROMBECTOMY N/A 02/07/2021   Procedure: PERIPHERAL VASCULAR THROMBECTOMY;  Surgeon: Lanis Fonda BRAVO, MD;  Location: [facility] INVASIVE CV LAB;  Service: Cardiovascular;  Laterality: N/A;   PLACEMENT OF IMPELLA LEFT VENTRICULAR ASSIST DEVICE Left 07/10/2023   Procedure: INSERTION, CARDIAC ASSIST DEVICE, IMPELLA 5.5;  Surgeon: Lucas Dorise POUR, MD;  Location: [facility] OR;  Service: Open Heart Surgery;  Laterality: Left;   RIGHT HEART CATH N/A 03/08/2022   Procedure: RIGHT HEART CATH;  Surgeon: Gardenia Led, DO;  Location: [facility] INVASIVE CV LAB;  Service: Cardiovascular;  Laterality: N/A;   RIGHT HEART CATH N/A 10/04/2022   Procedure: RIGHT HEART CATH;  Surgeon: Gardenia Led, DO;  Location: [facility] INVASIVE CV LAB;  Service: Cardiovascular;  Laterality: N/A;   RIGHT HEART CATH N/A 05/13/2023    Procedure: RIGHT HEART CATH;  Surgeon: [name], MD;  Location: [facility] INVASIVE CV LAB;  Service: Cardiovascular;  Laterality: N/A;   RIGHT HEART CATH N/A 06/02/2023   Procedure: RIGHT HEART CATH;  Surgeon: Cherrie Toribio SAUNDERS, MD;  Location: [facility] INVASIVE CV LAB;  Service: Cardiovascular;  Laterality: N/A;   RIGHT/LEFT HEART CATH AND CORONARY ANGIOGRAPHY N/A 06/19/2021   Procedure: RIGHT/LEFT HEART CATH AND CORONARY ANGIOGRAPHY;  Surgeon: Claudene Victory ORN, MD;  Location: [facility] INVASIVE CV LAB;  Service: Cardiovascular;  Laterality: N/A;   TEE WITHOUT CARDIOVERSION N/A 03/08/2022   Procedure: TRANSESOPHAGEAL ECHOCARDIOGRAM (TEE);  Surgeon: Gardenia Led, DO;  Location: [facility] ENDOSCOPY;  Service: Cardiovascular;  Laterality: N/A;   TRANSCATHETER MITRAL EDGE TO EDGE REPAIR N/A 07/09/2023   Procedure: TRANSCATHETER MITRAL EDGE TO EDGE REPAIR;  Surgeon: Wonda Sharper, MD;  Location: [facility] INVASIVE CV LAB;  Service: Cardiovascular;  Laterality: N/A;   TRANSESOPHAGEAL ECHOCARDIOGRAM (CATH LAB) N/A 06/02/2023   Procedure: TRANSESOPHAGEAL ECHOCARDIOGRAM;  Surgeon: Cherrie Toribio SAUNDERS, MD;  Location: [facility] INVASIVE CV LAB;  Service: Cardiovascular;  Laterality: N/A;   TRANSESOPHAGEAL ECHOCARDIOGRAM (CATH LAB) N/A 07/09/2023   Procedure: TRANSESOPHAGEAL ECHOCARDIOGRAM;  Surgeon: Wonda Sharper, MD;  Location: [facility] INVASIVE CV LAB;  Service: Cardiovascular;  Laterality: N/A;    Family History  Problem Relation Age of Onset   Hypertension Mother    Diabetes Paternal Aunt    Diabetes Paternal Uncle    Other Father        blood clots   Cancer Maternal Grandmother    Heart attack Paternal Grandmother    Colon cancer Neg Hx     Social History:  reports that she has been smoking cigarettes. She has never been exposed to tobacco smoke. She has never used smokeless tobacco. She reports current alcohol  use of about 2.0 standard drinks of alcohol  per week. She reports that she does not use drugs.  Allergies:   Allergies  Allergen Reactions   Sulfa  Antibiotics Itching, Swelling and Rash    Lip swelling    Medications: I have reviewed the patient's current medications. Prior to Admission:  Medications Prior to Admission  Medication Sig Dispense Refill Last Dose/Taking   albuterol  (VENTOLIN  HFA) 108 (90 Base) MCG/ACT inhaler Inhale 2 puffs into the lungs every 2 (two) hours as needed for wheezing or shortness of breath. 18 g 11 Past Week   atorvastatin  (LIPITOR) 20 MG tablet TAKE ONE TABLET BY MOUTH ONCE DAILY. 90 tablet 3 07/08/2023   benzonatate  (TESSALON ) 100 MG capsule Take 1 capsule (100 mg total) by mouth 3 (three) times daily as needed for cough. Do not take with alcohol  or while operating or driving heavy machinery 21 capsule 0 Past Week   bumetanide  (BUMEX ) 2 MG tablet Take 1 tablet (2 mg total) by mouth daily. 90 tablet 4 07/08/2023   digoxin  (LANOXIN ) 0.125 MG tablet Take 1 tablet (0.125 mg total) by mouth daily. 90 tablet 4 07/09/2023 at  5:15 AM   enoxaparin  (LOVENOX ) 80 MG/0.8ML  injection Inject two 80mg  syringes under the skin once a day 14.4 mL 0 07/08/2023   milrinone  (PRIMACOR ) 20 MG/100 ML SOLN infusion Inject 0.0318 mg/min into the vein continuous.   07/09/2023 Morning   NON FORMULARY Pt uses a cpap nightly   Taking   potassium chloride  SA (KLOR-CON  M) 20 MEQ tablet Take 2 tablets (40 mEq total) by mouth 2 (two) times daily. 120 tablet 6 07/08/2023   spironolactone  (ALDACTONE ) 25 MG tablet Take 2 tablets (50 mg total) by mouth daily. 180 tablet 4 07/08/2023   warfarin (COUMADIN ) 10 MG tablet Take 10 mg (1 tablet) by mouth daily Monday through Friday, then take 5 mg (1/2 tablet) by mouth on Saturday and on Sunday. 90 tablet 4 07/03/2023   Scheduled:  acetaminophen  500 mg Oral Q6H   amiodarone  400 mg Oral BID   Chlorhexidine Gluconate Cloth  6 each Topical Daily   feeding supplement  237 mL Oral TID BM   fluticasone  2 spray Each Nare Daily   furosemide  80 mg Intravenous BID    hydrALAZINE  10 mg Oral Q8H   insulin aspart  0-9 Units Subcutaneous Q4H   loratadine  10 mg Oral Daily   multivitamin with minerals  1 tablet Oral Daily   pantoprazole  40 mg Oral Daily   polyethylene glycol  17 g Oral BID   potassium chloride  40 mEq Oral BID   senna-docusate  2 tablet Oral BID   spironolactone  25 mg Oral Daily   Continuous:  heparin 2,250 Units/hr (07/21/23 1300)   milrinone 0.25 mcg/kg/min (07/21/23 1300)   sodium bicarbonate 25 mEq (Impella PURGE) in dextrose 5 % 1000 mL bag     PRN:acetaminophen, albuterol, ALPRAZolam, morphine injection, ondansetron (ZOFRAN) IV, mouth rinse, oxyCODONE, oxyCODONE, sodium chloride Anti-infectives (From admission, onward)    Start     Dose/Rate Route Frequency Ordered Stop   07/09/23 1407  ceFAZolin (ANCEF) 2-4 GM/100ML-% IVPB       Note to Pharmacy: Edmundson, Adam K: cabinet override      06 /18/25 1407 07/10/23 0214   07/09/23 0700  ceFAZolin  (ANCEF ) IVPB 2g/100 mL premix        2 g 200 mL/hr over 30 Minutes Intravenous On call 07/09/23 0647 07/09/23 1408       Results for orders placed or performed during the hospital encounter of 07/09/23 (from the past 48 hours)  Glucose, capillary     Status: Abnormal   Collection Time: 07/19/23  3:29 PM  Result Value Ref Range   Glucose-Capillary 146 (H) 70 - 99 mg/dL    Comment: Glucose reference range applies only to samples taken after fasting for at least 8 hours.  Glucose, capillary     Status: Abnormal   Collection Time: 07/19/23  7:45 PM  Result Value Ref Range   Glucose-Capillary 114 (H) 70 - 99 mg/dL    Comment: Glucose reference range applies only to samples taken after fasting for at least 8 hours.  Glucose, capillary     Status: Abnormal   Collection Time: 07/19/23 11:44 PM  Result Value Ref Range   Glucose-Capillary 105 (H) 70 - 99 mg/dL    Comment: Glucose reference range applies only to samples taken after fasting for at least 8 hours.  Glucose, capillary      Status: Abnormal   Collection Time: 07/20/23  3:43 AM  Result Value Ref Range   Glucose-Capillary 111 (H) 70 - 99 mg/dL    Comment:  Glucose reference range applies only to samples taken after fasting for at least 8 hours.  Cooxemetry Panel (carboxy, met, total hgb, O2 sat)     Status: Abnormal   Collection Time: 07/20/23  5:20 AM  Result Value Ref Range   Total hemoglobin 9.9 (L) 12.0 - 16.0 g/dL   O2 Saturation 33.5 %   Carboxyhemoglobin 2.0 (H) 0.5 - 1.5 %   Methemoglobin <0.7 0.0 - 1.5 %    Comment: Performed at [facility] Lab, [address] [place], [place] [address]  Magnesium      Status: None   Collection Time: 07/20/23  5:20 AM  Result Value Ref Range   Magnesium  2.1 1.7 - 2.4 mg/dL    Comment: Performed at [facility] Lab, [address], [place], [place] [address]  Basic metabolic panel with GFR     Status: Abnormal   Collection Time: 07/20/23  5:20 AM  Result Value Ref Range   Sodium 129 (L) 135 - 145 mmol/L   Potassium 3.8 3.5 - 5.1 mmol/L   Chloride 88 (L) 98 - 111 mmol/L   CO2 29 22 - 32 mmol/L   Glucose, Bld 94 70 - 99 mg/dL    Comment: Glucose reference range applies only to samples taken after fasting for at least 8 hours.   BUN 22 (H) 6 - 20 mg/dL   Creatinine, Ser 8.83 (H) 0.44 - 1.00 mg/dL   Calcium  9.3 8.9 - 10.3 mg/dL   GFR, Estimated 59 (L) >60 mL/min    Comment: (NOTE) Calculated using the CKD-EPI Creatinine Equation (2021)    Anion gap 12 5 - 15    Comment: Performed at [facility] Lab, [address], [place], [place] [address]  CBC     Status: Abnormal   Collection Time: 07/20/23  5:20 AM  Result Value Ref Range   WBC 6.9 4.0 - 10.5 K/uL   RBC 3.52 (L) 3.87 - 5.11 MIL/uL   Hemoglobin 9.4 (L) 12.0 - 15.0 g/dL   HCT 69.1 (L) 63.9 - 53.9 %   MCV 87.5 80.0 - 100.0 fL   MCH 26.7 26.0 - 34.0 pg   MCHC 30.5 30.0 - 36.0 g/dL   RDW 78.4 (H) 88.4 - 84.4 %   Platelets 109 (L) 150 - 400 K/uL    Comment: REPEATED TO VERIFY   nRBC 0.0 0.0 - 0.2  %    Comment: Performed at [facility], [address] [place], [place] [address]  Lactate dehydrogenase     Status: Abnormal   Collection Time: 07/20/23  5:20 AM  Result Value Ref Range   LDH 552 (H) 98 - 192 U/L    Comment: Performed at [facility] Lab, [address] [place], [place] [address]  Heparin  level (unfractionated)     Status: None   Collection Time: 07/20/23  5:20 AM  Result Value Ref Range   Heparin  Unfractionated 0.44 0.30 - 0.70 IU/mL    Comment: (NOTE) The clinical reportable range upper limit is being lowered to >1.10 to align with the FDA approved guidance for the current laboratory assay.  If heparin  results are below expected values, and patient dosage has  been confirmed, suggest follow up testing of antithrombin III  levels. Performed at [facility] Lab, [address], [place], [place] [address]   Glucose, capillary     Status: Abnormal   Collection Time: 07/20/23  8:27 AM  Result Value Ref Range   Glucose-Capillary  104 (H) 70 - 99 mg/dL    Comment: Glucose reference range applies only to samples taken after fasting for at least 8 hours.  Glucose, capillary     Status: Abnormal   Collection Time: 07/20/23 11:31 AM  Result Value Ref Range   Glucose-Capillary 126 (H) 70 - 99 mg/dL    Comment: Glucose reference range applies only to samples taken after fasting for at least 8 hours.  Glucose, capillary     Status: Abnormal   Collection Time: 07/20/23  4:00 PM  Result Value Ref Range   Glucose-Capillary 106 (H) 70 - 99 mg/dL    Comment: Glucose reference range applies only to samples taken after fasting for at least 8 hours.  Glucose, capillary     Status: Abnormal   Collection Time: 07/20/23  7:24 PM  Result Value Ref Range   Glucose-Capillary 144 (H) 70 - 99 mg/dL    Comment: Glucose reference range applies only to samples taken after fasting for at least 8 hours.  Glucose, capillary     Status: Abnormal   Collection Time: 07/20/23 11:15  PM  Result Value Ref Range   Glucose-Capillary 120 (H) 70 - 99 mg/dL    Comment: Glucose reference range applies only to samples taken after fasting for at least 8 hours.  Glucose, capillary     Status: Abnormal   Collection Time: 07/21/23  3:16 AM  Result Value Ref Range   Glucose-Capillary 110 (H) 70 - 99 mg/dL    Comment: Glucose reference range applies only to samples taken after fasting for at least 8 hours.  Cooxemetry Panel (carboxy, met, total hgb, O2 sat)     Status: Abnormal   Collection Time: 07/21/23  5:21 AM  Result Value Ref Range   Total hemoglobin 9.9 (L) 12.0 - 16.0 g/dL   O2 Saturation 46.6 %   Carboxyhemoglobin 2.4 (H) 0.5 - 1.5 %   Methemoglobin 0.9 0.0 - 1.5 %    Comment: Performed at [facility] Lab, [address], [place], [place] [address]  Magnesium      Status: None   Collection Time: 07/21/23  5:21 AM  Result Value Ref Range   Magnesium  2.1 1.7 - 2.4 mg/dL    Comment: Performed at [facility] Lab, [address] [place], [place] [address]  Basic metabolic panel with GFR     Status: Abnormal   Collection Time: 07/21/23  5:21 AM  Result Value Ref Range   Sodium 130 (L) 135 - 145 mmol/L   Potassium 3.9 3.5 - 5.1 mmol/L   Chloride 90 (L) 98 - 111 mmol/L   CO2 29 22 - 32 mmol/L   Glucose, Bld 112 (H) 70 - 99 mg/dL    Comment: Glucose reference range applies only to samples taken after fasting for at least 8 hours.   BUN 22 (H) 6 - 20 mg/dL   Creatinine, Ser 8.82 (H) 0.44 - 1.00 mg/dL   Calcium  9.3 8.9 - 10.3 mg/dL   GFR, Estimated 58 (L) >60 mL/min    Comment: (NOTE) Calculated using the CKD-EPI Creatinine Equation (2021)    Anion gap 11 5 - 15    Comment: Performed at [facility], [address] [place], [place] [address]  CBC     Status: Abnormal   Collection Time: 07/21/23  5:21 AM  Result Value Ref Range   WBC 7.7 4.0 - 10.5 K/uL   RBC 3.47 (L) 3.87 - 5.11 MIL/uL   Hemoglobin  9.3 (L) 12.0 - 15.0 g/dL   HCT 69.7 (L) 63.9 - 53.9 %    MCV 87.0 80.0 - 100.0 fL   MCH 26.8 26.0 - 34.0 pg   MCHC 30.8 30.0 - 36.0 g/dL   RDW 78.3 (H) 88.4 - 84.4 %   Platelets 110 (L) 150 - 400 K/uL    Comment: REPEATED TO VERIFY   nRBC 0.0 0.0 - 0.2 %    Comment: Performed at [facility], [address] [place], [place] [address]  Lactate dehydrogenase     Status: Abnormal   Collection Time: 07/21/23  5:21 AM  Result Value Ref Range   LDH 578 (H) 98 - 192 U/L    Comment: Performed at [facility] Lab, [address], [place], [place] [address]  Heparin  level (unfractionated)     Status: Abnormal   Collection Time: 07/21/23  5:21 AM  Result Value Ref Range   Heparin  Unfractionated 0.26 (L) 0.30 - 0.70 IU/mL    Comment: (NOTE) The clinical reportable range upper limit is being lowered to >1.10 to align with the FDA approved guidance for the current laboratory assay.  If heparin  results are below expected values, and patient dosage has  been confirmed, suggest follow up testing of antithrombin III  levels. Performed at [facility], [address], [place], [place] [address]   Cooxemetry Panel (carboxy, met, total hgb, O2 sat)     Status: Abnormal   Collection Time: 07/21/23  7:41 AM  Result Value Ref Range   Total hemoglobin 9.6 (L) 12.0 - 16.0 g/dL   O2 Saturation 46.1 %   Carboxyhemoglobin 2.8 (H) 0.5 - 1.5 %   Methemoglobin 0.9 0.0 - 1.5 %    Comment: Performed at [facility] Lab, [address] [place], [place] [address]  Glucose, capillary     Status: Abnormal   Collection Time: 07/21/23  7:41 AM  Result Value Ref Range   Glucose-Capillary 170 (H) 70 - 99 mg/dL    Comment: Glucose reference range applies only to samples taken after fasting for at least 8 hours.  Glucose, capillary     Status: Abnormal   Collection Time: 07/21/23 11:24 AM  Result Value Ref Range   Glucose-Capillary 123 (H) 70 - 99 mg/dL    Comment: Glucose reference range applies only to samples taken after fasting for at least 8  hours.   *Note: Due to a large number of results and/or encounters for the requested time period, some results have not been displayed. A complete set of results can be found in Results Review.    ECHOCARDIOGRAM LIMITED Result Date: 07/21/2023    ECHOCARDIOGRAM LIMITED REPORT   Patient Name:   ADMIRE BUNNELL Date of Exam: 07/21/2023 Medical Rec #:  [id number]       Height:       70.0 in Accession #:    [id number]      Weight:       235.4 lb Date of Birth:  04-Jan-1976       BSA:          2.237 m Patient Age:    47 years        BP:           108/71 mmHg Patient Gender: F               HR:           84 bpm. Exam Location:  Inpatient Procedure:  Limited Echo, Color Doppler and Cardiac Doppler (Both Spectral and            Color Flow Doppler were utilized during procedure). Indications:    I50.9* Heart failure (unspecified)  History:        Patient has prior history of Echocardiogram examinations, most                 recent 07/18/2023. Risk Factors:Sleep Apnea and Hypertension.                  Mitral Valve: Abbott Mitra-Clip valve is present in the mitral                 position. Procedure Date: 07/09/23.  Sonographer:    Damien Senior RDCS Referring Phys: [phone] EZRA RAMAN [facility]  Sonographer Comments: Impella repositioned, Dr Rolan at bedside IMPRESSIONS  1. Left ventricular ejection fraction, by estimation, is 25 to 30%. The left ventricle has severely decreased function. The left ventricle demonstrates global hypokinesis.  2. Impella 5.5 seen in the LV cavity measuring 3.26cm at the start of the study and 4.85cm at the end of the study.  3. Right ventricular systolic function is moderately reduced. The right ventricular size is mildly enlarged.  4. There is a Abbott Mitra-Clip present in the mitral position. Procedure Date: 07/09/23. Mitral regurgitation was not assessed.  5. Tricuspid valve regurgitation is mild to moderate.  6. The inferior vena cava is dilated in size with <50% respiratory variability, suggesting  right atrial pressure of 15 mmHg. FINDINGS  Left Ventricle: Left ventricular ejection fraction, by estimation, is 25 to 30%. The left ventricle has severely decreased function. The left ventricle demonstrates global hypokinesis. Right Ventricle: The right ventricular size is mildly enlarged. Right ventricular systolic function is moderately reduced. Mitral Valve: There is a Abbott Mitra-Clip present in the mitral position. Procedure Date: 07/09/23. Tricuspid Valve: The tricuspid valve is normal in structure. Tricuspid valve regurgitation is mild to moderate. Venous: The inferior vena cava is dilated in size with less than 50% respiratory variability, suggesting right atrial pressure of 15 mmHg. Additional Comments: Spectral Doppler performed. Color Doppler performed.  RIGHT VENTRICLE TAPSE (M-mode): 1.5 cm [name] [name] Electronically signed by Ria Commander Signature Date/Time: 07/21/2023/9:16:52 AM    Final     Review of Systems  Constitutional:  Positive for activity change and fatigue.  HENT:  Positive for dental problem.        Has 1 tooth that needs to be extracted by oral surgery.  Eyes: Negative.   Respiratory:  Positive for shortness of breath.   Cardiovascular:  Positive for leg swelling. Negative for chest pain.  Gastrointestinal: Negative.   Genitourinary: Negative.   Musculoskeletal: Negative.   Skin: Negative.   Neurological:  Negative for dizziness and syncope.  Hematological: Negative.   Psychiatric/Behavioral: Negative.     Blood pressure (!) 89/63, pulse 85, temperature 98.8 F (37.1 C), resp. rate 12, height 5' 10 (1.778 m), weight 106.8 kg, SpO2 (!) 71%. Physical Exam Constitutional:      Appearance: Normal appearance. She is obese.  HENT:     Head: Normocephalic and atraumatic.   Eyes:     Extraocular Movements: Extraocular movements intact.     Conjunctiva/sclera: Conjunctivae normal.     Pupils: Pupils are equal, round, and reactive to light.     Cardiovascular:     Rate and Rhythm: Normal rate and regular rhythm.     Pulses: Normal pulses.     Heart  sounds: Murmur heard.     Comments: 2/6 systolic murmur at apex Pulmonary:     Effort: Pulmonary effort is normal.     Breath sounds: Normal breath sounds.  Abdominal:     General: There is no distension.     Tenderness: There is no abdominal tenderness.   Musculoskeletal:        General: Swelling present.   Skin:    General: Skin is warm and dry.   Neurological:     General: No focal deficit present.     Mental Status: She is alert and oriented to person, place, and time.   Psychiatric:        Mood and Affect: Mood normal.        Behavior: Behavior normal.      TRANSESOPHOGEAL ECHO REPORT       Patient Name:   LANNIE HEAPS Date of Exam: 07/09/2023  Medical Rec #:  [id number]       Height:       70.0 in  Accession #:    [id number]      Weight:       243.0 lb  Date of Birth:  01/21/1976       BSA:          2.267 m  Patient Age:    47 years        BP:           134/97 mmHg  Patient Gender: F               HR:           94 bpm.  Exam Location:  Inpatient   Procedure: Transesophageal Echo, 3D Echo, Color Doppler and Cardiac  Doppler            (Both Spectral and Color Flow Doppler were utilized during             procedure).   Indications:     Mitral Regurgitation i34.0    History:         Patient has prior history of Echocardiogram examinations,  most                  recent 06/02/2023. CAD, Pulmonary HTN; Risk Factors:Sleep  Apnea                  and Hypertension.    Sonographer:     Damien Senior RDCS  Referring Phys:  [phone] MICHAEL COOPER  Diagnosing Phys: Maude Emmer MD     Sonographer Comments: MitraClip    PROCEDURE: After discussion of the risks and benefits of a TEE, an  informed consent was obtained from the patient. The transesophogeal probe  was passed without difficulty through the esophogus of the patient.  Sedation performed by  different physician.  The patient was monitored while under deep sedation. The patient developed  no complications during the procedure.    IMPRESSIONS     1. Left ventricular ejection fraction, by estimation, is 35 to 40%. The  left ventricle has moderately decreased function. The left ventricle  demonstrates global hypokinesis. The left ventricular internal cavity size  was severely dilated.   2. Right ventricular systolic function is normal. The right ventricular  size is normal.   3. Left atrial size was severely dilated. No left atrial/left atrial  appendage thrombus was detected.   4. Right atrial size was severely dilated.   5. Pre mTEER- severe functional eccentric posteriorly directed MR. PISA  radius 1.4 cm, full PV systolic reversal, ERO 0.92 and RV 98 cc.           Post mTEER- single XTW clip initially placed between A2P2. Multiple  attempts at placing a 2nd XTW lateral to the first clip was unsuccessful  with inability to grasp both anterior and posterior leaflets.  Unfortunately despite single clip delivery the  degree of MR remained severe. . The mitral valve is abnormal. Severe  mitral valve regurgitation. No evidence of mitral stenosis.   6. Tricuspid valve regurgitation is moderate.   7. The aortic valve is tricuspid. Aortic valve regurgitation is not  visualized. No aortic stenosis is present.   8. 3D performed of the mitral valve and demonstrates Extensive 3D imaging  fo mitral valve performed to guide mTEER.   9. Only left to right shunt post trans septal puncture.   FINDINGS   Left Ventricle: Left ventricular ejection fraction, by estimation, is 35  to 40%. The left ventricle has moderately decreased function. The left  ventricle demonstrates global hypokinesis. The left ventricular internal  cavity size was severely dilated.  There is no left ventricular hypertrophy.   Right Ventricle: The right ventricular size is normal. Right vetricular  wall  thickness was not assessed. Right ventricular systolic function is  normal.   Left Atrium: Left atrial size was severely dilated. No left atrial/left  atrial appendage thrombus was detected.   Right Atrium: Right atrial size was severely dilated.   Pericardium: There is no evidence of pericardial effusion.   Mitral Valve: Pre mTEER- severe functional eccentric posteriorly directed  MR. PISA radius 1.4 cm, full PV systolic reversal, ERO 0.92 and RV 98 cc.   Post mTEER- single XTW clip initially placed between A2P2. Multiple  attempts at placing a 2nd XTW lateral to the first clip was unsuccessful  with inability to grasp both anterior and posterior leaflets.  Unfortunately despite single clip delivery the  degree of MR remained severe. The mitral valve is abnormal. Severe mitral  valve regurgitation. No evidence of mitral valve stenosis.   Tricuspid Valve: The tricuspid valve is normal in structure. Tricuspid  valve regurgitation is moderate.   Aortic Valve: The aortic valve is tricuspid. Aortic valve regurgitation is  not visualized. No aortic stenosis is present.   Pulmonic Valve: The pulmonic valve was normal in structure. Pulmonic valve  regurgitation is mild.   Aorta: Aortic root could not be assessed.   IAS/Shunts: Only left to right shunt post trans septal puncture.   Additional Comments: 3D was performed not requiring image post processing  on an independent workstation and was abnormal. Spectral Doppler  performed.   Maude Emmer MD  Electronically signed by Maude Emmer MD  Signature Date/Time: 07/09/2023/2:43:12 PM        Final       ECHOCARDIOGRAM LIMITED REPORT       Patient Name:   CHELISA HENNEN Date of Exam: 07/21/2023 Medical Rec #:  [id number]       Height:       70.0 in Accession #:    [id number]      Weight:       235.4 lb Date of Birth:  12-14-1975       BSA:          2.237 m Patient Age:    47 years        BP:           108/71 mmHg Patient  Gender: F  HR:           84 bpm. Exam Location:  Inpatient  Procedure: Limited Echo, Color Doppler and Cardiac Doppler (Both Spectral and            Color Flow Doppler were utilized during procedure).  Indications:    I50.9* Heart failure (unspecified)   History:        Patient has prior history of Echocardiogram examinations, most                 recent 07/18/2023. Risk Factors:Sleep Apnea and Hypertension.                   Mitral Valve: Abbott Mitra-Clip valve is present in the mitral                 position. Procedure Date: 07/09/23.   Sonographer:    Damien Senior RDCS Referring Phys: [phone] EZRA RAMAN [facility]    Sonographer Comments: Impella repositioned, Dr Rolan at bedside IMPRESSIONS    1. Left ventricular ejection fraction, by estimation, is 25 to 30%. The left ventricle has severely decreased function. The left ventricle demonstrates global hypokinesis.  2. Impella 5.5 seen in the LV cavity measuring 3.26cm at the start of the study and 4.85cm at the end of the study.  3. Right ventricular systolic function is moderately reduced. The right ventricular size is mildly enlarged.  4. There is a Abbott Mitra-Clip present in the mitral position. Procedure Date: 07/09/23. Mitral regurgitation was not assessed.  5. Tricuspid valve regurgitation is mild to moderate.  6. The inferior vena cava is dilated in size with <50% respiratory variability, suggesting right atrial pressure of 15 mmHg.  FINDINGS  Left Ventricle: Left ventricular ejection fraction, by estimation, is 25 to 30%. The left ventricle has severely decreased function. The left ventricle demonstrates global hypokinesis.  Right Ventricle: The right ventricular size is mildly enlarged. Right ventricular systolic function is moderately reduced.  Mitral Valve: There is a Abbott Mitra-Clip present in the mitral position. Procedure Date: 07/09/23.  Tricuspid Valve: The tricuspid valve is normal in  structure. Tricuspid valve regurgitation is mild to moderate.  Venous: The inferior vena cava is dilated in size with less than 50% respiratory variability, suggesting right atrial pressure of 15 mmHg.  Additional Comments: Spectral Doppler performed. Color Doppler performed.   RIGHT VENTRICLE TAPSE (M-mode): 1.5 cm  [name] [name] Electronically signed by Ria Commander Signature Date/Time: 07/21/2023/9:16:52 AM       Final       Assessment/Plan:  This 48 year old woman has a history of chronic systolic heart failure with biventricular dysfunction on home inotropic therapy who now has undergone unsuccessful mTEER on 07/09/2023 with severe mitral regurgitation, severe pulmonary hypertension, and cardiogenic shock.  She has stabilized with an Impella 5.5 but continues to have severe mitral regurgitation with severe pulmonary hypertension.  I think the only option for treating her at this time is HeartMate 3 LVAD implantation and mitral valve replacement using a bioprosthetic valve.  This certainly has increased operative risk due to the complexity of the surgery and the need for concomitant mitral valve replacement.  I think her right ventricle has a good chance of recovery since she is able to maintain reasonable hemodynamics with wide open mitral regurgitation and she has been able to get up ambulating with PT today.  Hopefully her pulm artery pressures will drop significantly with mitral valve replacement.  She has no other endorgan failure at this time.  Her  pulmonary function testing on 07/17/2023 showed a severely reduced FEV1 of 1.09 but I doubt this is reliable given the severity of her mitral regurgitation.  She has 1 tooth that oral surgery felt should be removed but the oral surgeon is not available for another week since he is on vacation and there is no backup.  There is no sign of dental abscess on orthopantogram and I think would be best to proceed with LVAD therapy and have  that tooth extracted later. I discussed LVAD insertion and MVR with her including high risks, possibility of needing RV support postop. We will discuss her case at [facility] before making final decision about candidacy for LVAD.  Quina Wilbourne K Finola Rosal 07/21/2023, 2:14 PM

## 2023-07-21 NOTE — Progress Notes (Signed)
 Pt still undergoing medical workup (LVAD workup). Will continue to follow from a distance to determine appropriateness of CIR.   Tinnie Yvone Cohens, MS, CCC-SLP Admissions Coordinator [phone]

## 2023-07-21 NOTE — Progress Notes (Addendum)
 Patient ID: [name] [name], female   DOB: 1975-08-10, 48 y.o.   MRN: [id number]     Advanced Heart Failure Rounding Note  Cardiologist: Ria Commander, DO  Chief Complaint: Cardiogenic Shock Subjective:    6/17: mTEER with sev posterior leaflet restriction resulting in tear. IABP + PAC. PCWP 40, v up to 60 6/24 : Aggressive diuresed. Started on lasix  drip. Brisk diuresis noted.   I/Os net negative -734. No weight. She is on milrinone  0.25.   NSR on amiodarone .   She has been walking, feels good/no complaints.   Impella  P9 Flow 5.6 L/min LDH 552 => 578 Plts 109 => 110 Hepatin gtt ongoing  Swan numbers: RA 22 PA 89/32 Unable to wedge CI 2.91 Co-ox 53%  Objective:    Weight Range: 106.8 kg Body mass index is 33.78 kg/m.   Vital Signs:   Temp:  [98.1 F (36.7 C)-99.3 F (37.4 C)] 98.2 F (36.8 C) (06/30 0700) Pulse Rate:  [81-155] 107 (06/29 1830) Resp:  [14-43] 21 (06/30 0700) BP: (42-104)/(27-91) 101/76 (06/30 0700) SpO2:  [71 %-100 %] 71 % (06/29 1830) Last BM Date : 07/19/23  Weight change: Filed Weights   07/09/23 0636 07/10/23 0349 07/17/23 0615  Weight: 110.2 kg 111.4 kg 106.8 kg   Intake/Output:  Intake/Output Summary (Last 24 hours) at 07/21/2023 0715 Last data filed at 07/21/2023 0700 Gross per 24 hour  Intake 1050.83 ml  Output 1820 ml  Net -769.17 ml   Physical Exam   Vitals:   07/21/23 0645 07/21/23 0700  BP:  101/76  Pulse:    Resp: (!) 22 (!) 21  Temp: 98.1 F (36.7 C) 98.2 F (36.8 C)  SpO2:     General: NAD Neck: JVP 16 cm, no thyromegaly or thyroid  nodule.  Lungs: Clear to auscultation bilaterally with normal respiratory effort. CV: Nondisplaced PMI.  Heart regular S1/S2, +S3, 2/6 HSM apex.  Trace ankle edema.     Abdomen: Soft, nontender, no hepatosplenomegaly, no distention.  Skin: Intact without lesions or rashes.  Neurologic: Alert and oriented x 3.  Psych: Normal affect. Extremities: No clubbing or cyanosis.   HEENT: Normal.    Telemetry  NSR 80s-90s  Labs    CBC Recent Labs    07/20/23 0520 07/21/23 0521  WBC 6.9 7.7  HGB 9.4* 9.3*  HCT 30.8* 30.2*  MCV 87.5 87.0  PLT 109* 110*   Basic Metabolic Panel Recent Labs    93/70/74 0520 07/21/23 0521  NA 129* 130*  K 3.8 3.9  CL 88* 90*  CO2 29 29  GLUCOSE 94 112*  BUN 22* 22*  CREATININE 1.16* 1.17*  CALCIUM  9.3 9.3  MG 2.1 2.1   Liver Function Tests No results for input(s): AST, ALT, ALKPHOS, BILITOT, PROT, ALBUMIN  in the last 72 hours.   BNP (last 3 results) Recent Labs    05/28/23 1224 06/30/23 1218 07/04/23 1057  BNP 1,155.3* 600.6* 612.0*   Fasting Lipid Panel No results for input(s): CHOL, HDL, LDLCALC, TRIG, CHOLHDL, LDLDIRECT in the last 72 hours.   Medications:    Scheduled Medications:  acetaminophen   500 mg Oral Q6H   amiodarone   400 mg Oral BID   Chlorhexidine  Gluconate Cloth  6 each Topical Daily   feeding supplement  237 mL Oral TID BM   fluticasone   2 spray Each Nare Daily   furosemide   80 mg Intravenous BID   hydrALAZINE   10 mg Oral Q8H   insulin  aspart  0-9 Units Subcutaneous Q4H  loratadine   10 mg Oral Daily   metolazone   2.5 mg Oral Once   multivitamin with minerals  1 tablet Oral Daily   pantoprazole   40 mg Oral Daily   polyethylene glycol  17 g Oral BID   potassium chloride   40 mEq Oral BID   senna-docusate  2 tablet Oral BID   spironolactone   25 mg Oral Daily   Infusions:  heparin  2,250 Units/hr (07/21/23 0700)   milrinone  0.25 mcg/kg/min (07/21/23 0700)   sodium bicarbonate  25 mEq (Impella PURGE) in dextrose  5 % 1000 mL bag     PRN Medications: acetaminophen , albuterol , ALPRAZolam , morphine  injection, ondansetron  (ZOFRAN ) IV, mouth rinse, oxyCODONE , oxyCODONE , sodium chloride   Patient Profile   48 y.o. female with history of chronic systolic HF with biventricular dysfunction on home inotrope, severe MR, hx PE/DVT, obesity, CAD, anemia, tobacco use.  Presented for mTEER on 07/09/23. Worsening MR leading to cardiogenic shock.  Assessment/Plan  Cardiogenic shock: due to torrential MR s/p mTEER complicated by severely restricted posterior leaflet that was damaged during repair attempt, IABP and nipride  needed to stabilize. Transitioned to Impella 5.5. Consideration for LVAD workup, but she has significant social concerns. Not a txp candidate with tobacco use.  I do not believe that she would be able to survive without LVAD support, the alternative is comfort care.  - Impella 5.5 at P9 flowing 5.6 LPM. Will get limited echo today to assess position and RV function.  - I/Os net negative -734 with Lasix  60 mg IV bid.  - Adequate CI by thermo (2.91) though co-ox only 53%.  Repeat co-ox.  - Can continue hydralazine  10mg  TID - PA pressures concerning, quite high.  Unable to wedge today despite advancing Swan considerably (suspect PCWP high with MR).  CVP also significantly elevated this morning at 22.  - Lasix  80 mg IV bid with metolazone  2.5 x 1 today and replace K.  May need to transition to Lasix  gtt, will follow response.  - Continue milrinone  0.25 for now.  - RV function had improved on most recent echo, will repeat today.  I discussed with Dr. Lucas, needs 1 tooth out but concerned that waiting until next week to do this is not worth the risk.  Will continue aggressive PT with plan to discuss at [facility] this week. Will need formal evaluation by LVAD team. Possible LVAD surgery Thursday.  With torrential MR and torn leaflet, she will need MV replacement as well.  She may need Protek Duo in OR post-op depending on how RV looks.  - PFTs with severely reduced volumes. I feel this is out of proportion to her clinically. I suspect this may be due to poor effort and weakness. Will repeat. In addition, CT chest does not demonstrating significant parenchymal lung disease. Would like to have her fully diuresed prior to repeating.   Acute on chronic systolic HF  with biventricular dysfunction - stage D cardiomyopathy - nonobstructive CAD on cath 2023.  - RHC 5/25 on milrinone  0.25: severe pulmonary venous HTN, severely elevated PCWP with prominent v waves, preserved CO - I think she can come off milrinone  from a RV standpoint; however, I worry about her elevated PA pressures. Will continue 0.25mcg/kg/min; mixed venous 66%.    Torrential MR - See above - Improved with unloading, extubated 6/20   Acute hypoxic respiratory failure - In setting of acute on chronic CHF and severe/torrential  - see above  Hx of PE/DVT w/ IVC filter - Evaluated at [facility] where thrombophilia work-up  was negative (Lupus inhibitor, anticardiolipin antibodies, and anti beta-2  glycoprotein antibodies were negative; protein C activity 85%, protein S activity 76%, factor V Leiden, and prothrombin gene mutation ) - IVC filter in place; could not be removed due to significant thrombus burden on filter at [facility]  - Heparin  gtt for now with Impella and potential procedures   CAD - LHC in 5/23 w/ nonobstructive CAD involving RCA - No aspirin  with need for anticoagulation.  Tobacco use - Smoking 3 cigarettes/day at time of last follow-up.  Anemia  - Hgb stable - Transfuse for HGB < 8. No obvious source.   Headache, Severe - CT yesterday (-) - No HA this morning.   PVCs - Continue PO amio.   VAD work up underway.  Palliative Care consulted.   Length of Stay: 11  Ezra Shuck, MD  07/21/2023, 7:15 AM  Advanced Heart Failure Team Pager [phone] (M-F; 7a - 5p)  Please contact [facility] for night-coverage after hours (5p -7a ) and weekends on [email or web address]  CRITICAL CARE Performed by: Ezra Shuck   Total critical care time: 45 minutes  Critical care time was exclusive of separately billable procedures and treating other patients.  Critical care was necessary to treat or prevent imminent or life-threatening deterioration.  Critical care was time spent  personally by me on the following activities: development of treatment plan with patient and/or surrogate as well as nursing, discussions with consultants, evaluation of patient's response to treatment, examination of patient, obtaining history from patient or surrogate, ordering and performing treatments and interventions, ordering and review of laboratory studies, ordering and review of radiographic studies, pulse oximetry and re-evaluation of patient's condition.

## 2023-07-21 NOTE — TOC Progression Note (Signed)
 Transition of Care [facility]) - Progression Note    Patient Details  Name: [name] [name] MRN: [id number] Date of Birth: 02/07/1975  Transition of Care [facility]) CM/SW Contact  Justina Delcia Czar, RN Phone Number: 07/21/2023, 11:20 AM  Clinical Narrative:   LVAD workup Spoke to pt and states she wants to continue to get better.  Gave permission to speak to mother. Lives I home with mother.  Will continue to follow for dc needs.     Expected Discharge Plan: IP Rehab Facility Barriers to Discharge: Continued Medical Work up  Expected Discharge Plan and Services   Discharge Planning Services: CM Consult Post Acute Care Choice: IP Rehab Living arrangements for the past 2 months: Single Family Home                           HH Arranged: RN [facility] Agency: [facility] Date HH Agency Contacted: 07/11/23 Time HH Agency Contacted: 1534 Representative spoke with at [facility] Agency: Pam RN   Social Determinants of Health (SDOH) Interventions SDOH Screenings   Food Insecurity: No Food Insecurity (05/20/2023)  Housing: Low Risk  (05/20/2023)  Transportation Needs: No Transportation Needs (05/20/2023)  Utilities: Not At Risk (05/20/2023)  Alcohol  Screen: Low Risk  (04/03/2023)  Depression (PHQ2-9): Low Risk  (05/26/2023)  Financial Resource Strain: Low Risk  (04/03/2023)  Physical Activity: Insufficiently Active (04/03/2023)  Social Connections: Socially Isolated (04/03/2023)  Stress: No Stress Concern Present (04/03/2023)  Tobacco Use: High Risk (07/10/2023)    Readmission Risk Interventions    07/03/2022   12:12 PM 02/13/2022   12:41 PM 12/31/2021    1:52 PM  Readmission Risk Prevention Plan  Transportation Screening Complete Complete   HRI or Home Care Consult Complete Complete Complete  Social Work Consult for Recovery Care Planning/Counseling Complete Complete Complete  Palliative Care Screening Not Applicable Not Applicable Not Applicable  Medication Review Oceanographer) Complete  Complete Complete

## 2023-07-21 NOTE — Progress Notes (Signed)
 Patient ID: [name] [name], female   DOB: Oct 01, 1975, 48 y.o.   MRN: [id number]  Echo reviewed at bedside, MR is still severe.  RV is moderately dilated, mildly dysfunctional.  Impella 5.5 short at 2.5-3 cm from aortic valve.  Advanced to 4.8 cm.   Ezra Shuck 07/21/2023 8:46 AM

## 2023-07-21 NOTE — Progress Notes (Signed)
 LVAD Initial Psychosocial Screening  Date/Time Initiated:  07/21/23 at  Referral Source:  VAD Coordinators  Referral Reason:  LVAD Candidate  Source of Information:  Patient  Demographics Name:  [name] [name] Address:  [address]  [place] [address]  Home phone:  [phone] (home)    Cell: [phone] Marital Status: Widowed  Faith:  N/A Primary Language:  English  Last 4 # SS: [id number]  DOB: January 05, 1976   Psychological Health Appearance:  In hospital gown Mental Status:  Alert, oriented Eye Contact:  Good Thought Content:  Coherent Speech:  Logical/coherent Mood:  Appropriate  Affect:  Appropriate to circumstance Insight:  Good Judgement: Unimpaired Interaction Style:  Engaged and Positive   Family/Social Information Who lives in your home? Name: [name] [name]  Relationship: Mother  [name] [name]     Daughter  [name] [name]    Step-father (patients stepfather)   Other family members/support persons in your life? Name: [name] [name]  Relationship: Aunt  [name] [name]     Dad  Community Are you active with community agencies/resources/homecare? No Agency Name: N/A Are you active in a church, synagogue, mosque or other faith based community? No Faith based institutions name: N/A Do you have other sources do you have for spiritual support? N/A Are you active in any clubs or social organizations? N?A What do you do for fun?  Hobbies?  Interests? Cross word puzzles, play games, watch TV   Home Environment/Personal Care Do you have reliable phone service? Yes  If so, what is the number?  [phone] Do you own or rent your home? Own (Brenda's house)  Current mortgage/rent: Paid off Number of steps into the home? 3-4 steps How many levels in the home? 1 Electrical needs for LVAD (3 prong outlets)? No  Second hand smoke exposure in the home? No Travel distance from [facility]? 45 minutes   Financial Information What is your source of income? SSDI (disability) Do you have difficulty meeting your  monthly expenses? No If yes, which ones? N/A How do you cope with this? N/A Can you budget for the monthly cost for dressing supplies post procedure? Yes   Primary Health insurance:  [employer] Secondary Insurance: [place] Medicaid  Prescription plan: For the most part prescriptions are covered, except for benzonatate   Have you ever had to refuse medication due to cost?  No Do you use mail order for your prescriptions?  No Have you applied for Medicaid?  Has Medicaid Have you applied for Social Security Disability/(SSI) ? Has disability (approximately [phone])   Education/Work Information What is the last grade of school you completed? High school graduate  Do you have any problems with reading or writing?  No Preferred method of learning?  Hands on Are you currently employed?  No If not currently employed when did you last work? 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 No  If so, what branch? N/A   Advance Directives: Do you have a Living Will or Medical POA? Pending  Would you like to complete a Living Will and Medical POA prior to surgery?  Pending Have you had a consult with the Palliative Care Team at [facility]? No   Legal Do you currently have any legal issues/problems?  No Have you had any legal issues/problems in the past?  No Do you have a Durable POA?  N/A Name of  Durable POA? N/A Contact number:  N/A  Medical Information Do you have any family history of heart problems? Dad's side of family had congestive heart failure Do you have a PCP or other medical provider? Yes Are you able to complete your ADL's?  Yes Do you have any assistive devices in the home?  No How are you currently managing your medications at home? Taking them out the bottle  How many hours do you sleep at night? 5-6 hours How is your appetite? PTA, patient reported skipping a meal but would  eat at least twice a day  Do you smoke now or past usage? Yes , about 3 cigarettes per day  Quit date: PTA on 6/16 Do you drink alcohol  now or past usage? past usage , socially  Quit date:  Jun 01, 2023 Are you currently using illegal drugs or misuse of medication or past usage? never  Have you ever been treated for substance abuse? No      If yes, where and when did you receive treatment? N/A   Mental Health History How have you been feeling in the past year? Patient reported feeling okay and stated she is trying to take in everything with her having heart failure and being so young.   PHQ2 Depression Scale: 0 PHQ9 Depression scale (if positive PHQ2 screen): N/A  Have you ever had any problems with depression, anxiety or other mental health concerns? Stated no, but also thinks she may be having panic attacks as a result of underlying anxiety.  Have you or are you taking medications for anxiety/depression or any mental health concerns?  No  Current Medications: N/A Do you have a history of a traumatic event? No Have you had any past or current thoughts of suicide? No Are there any other current stressors in your life?  No Do you see a counselor, psychiatrist or therapist?  No If you are currently experiencing problems are you interested in talking with a professional? No What are your coping strategies under stressful situations? Talk to God  Would you be interested in attending the LVAD support group? Yes   Medical & Follow-up Do you take your medications as prescribed by the doctors? yes Do you feel as if you have a good understanding of your medications and what they are for? Yes If you experience medical concerns or barriers to care in between appointments how do you manage this? I call my PCP or go to urgent care  No Show Rate Percentage: 17% Comments: -   Caregiving Needs Who is the primary caregiver? [name] [name] (mom) Health status:  Good Do you drive?  Yes, but  not in [place] per patient  Do you work?  Yes Physical Limitations:  No Do you have other care giving responsibilities?  No Contact number: [phone]   Who is the secondary caregiver? [name] [name]  Health status:  Fair Do you drive?  Yes Do you work?  No  Physical Limitations:  Knees get weak  Do you have other care giving responsibilities?  Helps out with her grandkids  Contact number: [phone]    Plan for VAD Implementation Do you know and understand what happens during the VAD surgery? Patient Verbalizes Understanding  of surgery and able to describe details What do you know about the risks and side effect associated with VAD surgery? Patient Verbalizes Understanding  of risks (infection, stroke and death) Explain what will happen right after surgery: Patient Verbalizes Understanding  of OR to ICU and  will be intubated  What is your plan for transportation for the first 8 weeks post-surgery? (Patients are not recommended to drive post-surgery for 8 weeks)  Driver: [name] [name] (mom), [name] [name] (aunt), Medicaid transportation, [employer] transportation ([employer]) Do you have airbags in your vehicle?  There is a risk of discharging the device if the airbag were to deploy. Yes What do you know about your diet post-surgery? Patient Verbalizes Understanding  of Heart healthy How do you plan to complete ADL's post-surgery?  Get up early and get it done Will it be difficult to ask for help from your caregivers?  No  Please explain what you hope will be improved about your life as a result of receiving the LVAD? That I can live a long, healthier life and have more time with my daughter. Please tell me your biggest concern or fear about living with the LVAD?  That it does not work or the batteries go out Please explain your understanding of how your body will change?  Are you worried about these changes? That it will change things for the worst and not the better Do you see any  barriers to your surgery or follow-up? No  Understanding of LVAD Patient states understanding of the following: Surgical procedures and risks, Electrical need for LVAD (3 prong outlets), Safety precautions with LVAD (water, etc.), LVAD daily self-care (dressing changes, computer check, extra supplies), Outpatient follow up (LVAD clinic appts, monitoring blood thinners), and Need for Emergency Planning  Discussed and Reviewed with Patient and Caregiver  Patient's current level of motivation to prepare for LVAD: Hopeful that it works  Patient's present Level of Consent for LVAD: Agreeable      Education provided to patient/family/caregiver:  To be completed at a later time when caregivers are available.   Caregiver questions: To be completed at a later time when caregivers are available.    2:23 PM Addendum to education and caregiver qestions.  Education provided to patient/family/caregiver:   Caregiver role and responsibiltiy, Financial planning for LVAD, Role of Clinical Social Worker, and Signs of Depression and Anxiety    Discussed and Reviewed with Patient and Caregiver  Caregiver questions Please explain what you hope will be improved about your life and loved one's life as a result of receiving the LVAD?  [facility] it works for her. She has an 47 year old to raise and I hope the LVAD does what it is suppose to do.  [name] [name] stated Same as what [name] [name] said. I hope it helps get her back to being independent.   What is your biggest concern or fear about caregiving with an LVAD patient?  [name] [name] Make sure I am doing everything right   What is your plan for availability to provide care 24/7 x2 weeks post op and dressing changes ongoing? [name] [name] stated she is going to ask her other siblings for assistance to provide care as well.   Who is the relief/backup caregiver and what is their availability?  [name] [name], is available for transportation needs primarily and emotional  support. [name] [name] (mother) stated she will get other family members to help assist with dressing changes.   Preferred method of learning? Verbal and Hands on   Do you drive? Patients aunt, [name] [name], drives and will provide transportation for patient  How do you handle stressful situations?  [name] [name] (mother) stated she tries to let things go and not let stress get to her. Do you think you can do this? Yes Is there anything that  concerns about caregiving?  No Do you provide caregiving to anyone else?  No  Caregiver's current level of motivation to prepare for LVAD: Hesitant but willing to do what's needed Caregiver's present level of consent for LVAD:  Agreeable     Clinical Interventions Needed:   Patient asked for pill box, and would like personal care services. CSW notified unit CSW and NCM.   CSW will monitor signs and symptoms of depression and assist with adjustment to life with an LVAD.  CSW encouraged attendance with the LVAD Support Group to assist further with adjustment and post implant peer support.   Clinical Impressions/Recommendations:  CSW called [name], to inquire about her attending today's meeting to complete LVAD assessment. [name] explained to CSW that a family member has recently passed and their funeral is today. Patient mother was unavailable to answer questions today as well. CSW will follow up with caregivers at a later time.  CSW met [name] [name] at bedside to complete LVAD assessment. [name] [name] is currently living with her mother [name] [name], daughter [name] [name], and stepfather [name] [name] in a single-level, owner-occupied home. Concerning advance care planning, the patient currently has no Living Will or Medical Power of Attorney in place, though these documents are pending. Patient had advance directive paperwork at bedside to complete with family. Patient stated her family should be coming to the hospital Wednesday July 2nd. When conducting the assessment patient  identified her mother, [name] [name], as her primary caregiver. Patient identified her aunt, [name], as a backup caregiver. Overall, the patient displays motivation and readiness for LVAD implantation. She stated she has adequate social support, financial stability, health literacy, and realistic expectations.   07/23/23 CSW met with patient and family at bedside (Aunt [name], mother [name] [name]) and completed caregiver section of the LVAD assessment. Mom was designated primary caregiver and is agreeable to this. [name] [name], has been designated secondary caregiver but stated she is there to assist with transportation and emotional support. [name] stated she will enlist the help of her other siblings for additional caregiver support.    [name] [name], MSW, LCSWA Transitions of Care [phone]

## 2023-07-21 NOTE — Progress Notes (Signed)
 Physical Therapy Treatment Patient Details Name: [name] [name] MRN: [id number] DOB: 07-Jul-1975 Today's Date: 07/21/2023   History of Present Illness 48 yo presented 07/09/23 for transcatheter mitral valve repair with Mitraclip. Post-op severe residual MR and cardiogenic shock. 6/19 impella inserted; extubated 6/20; ?for LVAD workup  PMH-chronic HF (on home milrinone ); CAD, DVT, fibroid tumor, hidradenitis, cardiomyopathy, NSVT, PE, obesity    PT Comments  Making good functional progress, ambulating ~900 feet with light RW use for support focusing on symmetry of gait, reduced UE support, and RPE awareness (avg 4/10 with bout today.) No overt buckling or LOB with AD. Supervision for safety with +2 to manage lines/leads. Has been walking with nursing staff more lately as well. Patient will continue to benefit from skilled physical therapy services to further improve independence with functional mobility.     If plan is discharge home, recommend the following: Assistance with cooking/housework;Assist for transportation;Help with stairs or ramp for entrance;A little help with walking and/or transfers;A little help with bathing/dressing/bathroom   Can travel by private Psychologist, clinical (4 wheels);BSC/3in1    Recommendations for Other Services       Precautions / Restrictions Precautions Precautions: Fall;Other (comment) Recall of Precautions/Restrictions: Impaired Precaution/Restrictions Comments: impella L shoulder access Restrictions Weight Bearing Restrictions Per Provider Order: No     Mobility  Bed Mobility Overal bed mobility: Needs Assistance Bed Mobility: Sit to Supine     Supine to sit: Supervision, HOB elevated     General bed mobility comments: Supervision for safety, cues for mindfulness of lines/leads which were managed by PT and RN.    Transfers Overall transfer level: Needs assistance Equipment used: Rolling walker (2  wheels) Transfers: Sit to/from Stand Sit to Stand: +2 safety/equipment, Supervision           General transfer comment: Supervision for safety with +2 assist to manage lines/leads. Cues for hand placement, RW to steady upon rising.    Ambulation/Gait Ambulation/Gait assistance: +2 safety/equipment, Supervision (( RN)) Gait Distance (Feet): 900 Feet Assistive device: Rolling walker (2 wheels) Gait Pattern/deviations: Step-through pattern, Decreased stride length, Trunk flexed Gait velocity: dec Gait velocity interpretation: 1.31 - 2.62 ft/sec, indicative of limited community ambulator   General Gait Details: Cues for upright posture and reduced support from RW. Progressed to supervision for safety with +2 assist for line/lead management. Educated on RPE: avg 4/10 with distance today. VSS throughout on RA. No overt LOB or buckling.   Stairs             Wheelchair Mobility     Tilt Bed    Modified Rankin (Stroke Patients Only)       Balance Overall balance assessment: Needs assistance Sitting-balance support: Feet supported, No upper extremity supported Sitting balance-Leahy Scale: Good     Standing balance support: During functional activity, Bilateral upper extremity supported Standing balance-Leahy Scale: Poor Standing balance comment: RW for support.                            Communication Communication Communication: No apparent difficulties  Cognition Arousal: Alert Behavior During Therapy: WFL for tasks assessed/performed   PT - Cognitive impairments: No apparent impairments                         Following commands: Intact      Cueing Cueing Techniques: Verbal cues  Exercises  General Comments General comments (skin integrity, edema, etc.): Impella P - 9      Pertinent Vitals/Pain Pain Assessment Pain Assessment: No/denies pain    Home Living                          Prior Function             PT Goals (current goals can now be found in the care plan section) Acute Rehab PT Goals Patient Stated Goal: to get better PT Goal Formulation: With patient Time For Goal Achievement: 07/26/23 Potential to Achieve Goals: Fair Progress towards PT goals: Progressing toward goals    Frequency    Min 2X/week      PT Plan      Co-evaluation              AM-PAC PT 6 Clicks Mobility   Outcome Measure  Help needed turning from your back to your side while in a flat bed without using bedrails?: A Little Help needed moving from lying on your back to sitting on the side of a flat bed without using bedrails?: A Little Help needed moving to and from a bed to a chair (including a wheelchair)?: A Little Help needed standing up from a chair using your arms (e.g., wheelchair or bedside chair)?: A Little Help needed to walk in hospital room?: A Little Help needed climbing 3-5 steps with a railing? : A Lot 6 Click Score: 17    End of Session Equipment Utilized During Treatment: Gait belt Activity Tolerance: Patient tolerated treatment well Patient left: with nursing/sitter in room;with call bell/phone within reach;in bed;with bed alarm set Nurse Communication: Mobility status PT Visit Diagnosis: Unsteadiness on feet (R26.81);Muscle weakness (generalized) (M62.81);Difficulty in walking, not elsewhere classified (R26.2)     Time: 8473-8451 PT Time Calculation (min) (ACUTE ONLY): 22 min  Charges:    $Gait Training: 8-22 mins PT General Charges $$ ACUTE PT VISIT: 1 Visit                     Leontine Roads, PT, DPT [facility]  Rehabilitation Services Physical Therapist Office: [phone] Website: [email or web address]    Leontine GORMAN Roads 07/21/2023, 4:38 PM

## 2023-07-21 NOTE — Progress Notes (Signed)
 Nutrition Follow-up  DOCUMENTATION CODES:   Not applicable  INTERVENTION:   Continue Regular diet, no salt packet  Add protein-containing snacks BID between meals  Continue Ensure Plus High Protein po TID, each supplement provides 350 kcal and 20 grams of protein. Pt to continue to drink at least 1 full Ensure per day, goal to increase as able  Continue MVI with Minerals   NUTRITION DIAGNOSIS:   Increased nutrient needs related to acute illness as evidenced by estimated needs.  Being addressed  GOAL:   Patient will meet greater than or equal to 90% of their needs  Progressing  MONITOR:   PO intake, Supplement acceptance, Labs, Weight trends  REASON FOR ASSESSMENT:   Consult Enteral/tube feeding initiation and management  ASSESSMENT:   Pt presented for mitral valve clip procedure. Procedure c/b severe residual MR and cardiogenic shock.  PMH significant for nonischemic cardiomyopathy, chronic biventricular HFrEF, obesity hypoventilation syndrome  6/18: s/p mTEER procedure with severe residual MR, severe posterior leaflet restriction result in tear, cardiogenic shock, IABP placement 6/19: OR for impella 5.5 placement 6/20: extubated 6/27: PFTs with severely reduced volumes 6/30: Bedside ECHO with severe MR, RV mod dilated with mild dysfunction  Impella 5.5 at P9 Remains on milrinone  gtt  Pt up and walking around the unit with RN this AM.Sitting in chair on RD arrival, awaiting lunch. Pt reports she is feeling overall ok today  Altered taste still remains an issue but able to eat more than previously  Pt ate 2 slices of bacon and her cereal plus part of the banana this AM. Pt reports she ate better over the weekend as well. Pt likes the chicken salad, tuna salad and entree salads. Encouraged pt to order this as meal times if she prefers. Also plan to order snacks between meals BID to encourage small frequent meals.   Pt drinking Ensure, reports drink 1 full Ensure  per day on average. Encourage pt to continue to do this, increase as able.   Reinforced importance of optimizing nutrition prior to surgery. Pt verbalized understanding.  LVAD work-up continues; per RN, tentative plan for LVAD plus MVR on Thursday this week. Noted possible need Protek Duo in OR post-op depending on how RV looks.   Noted daily weights have been ordered to start tomorrow. Last measured wt 106.8 kg, down from 11.4 kg on 6/19. No new wt today  UOP 1.8L in 24 hours, net negative for the admission  Labs: Sodium 130 (L) Potassium 3.9 (Wdl) CBGs 110-170 Creatinine 1.17 BUN 22  Meds:  Lasix  BID KCL BID MVI with Minerals Miralax  BID Heparin  drip SS novolog    Diet Order:   Diet Order             Diet regular Room service appropriate? Yes; Fluid consistency: Thin  Diet effective now                   EDUCATION NEEDS:   No education needs have been identified at this time  Skin:  Skin Assessment: Reviewed RN Assessment (closed surgical incision to L chest)  Last BM:  6/28  Height:   Ht Readings from Last 1 Encounters:  07/09/23 5' 10 (1.778 m)    Weight:   Wt Readings from Last 1 Encounters:  07/17/23 106.8 kg    Ideal Body Weight:  68.2 kg  BMI:  Body mass index is 33.78 kg/m.  Estimated Nutritional Needs:   Kcal:  1900-2100  Protein:  100-115g  Fluid:  >/=1.9L  Betsey Finger MS, RDN, LDN, CNSC Registered Dietitian 3 Clinical Nutrition RD Inpatient Contact Info in Amion

## 2023-07-21 NOTE — Progress Notes (Signed)
 Palliative:  HPI: 48 y.o. female  with past medical history of biventricular heart failure on home inotrope, severe MR, h/o PE/DVT, CAD, obesity, anemia, tobacco use admitted on 07/09/2023 for scheduled mTEER completed 6/17 but complicated by severely restricted posterior leaflet damaged during repair attempt. Requiring IABP and nipride . Being evaluated for LVAD candidacy although there are concerns noted.   I met today with [name] [name]. She is awaiting her family to come to bedside. She knew they were busy running errands this morning in preparation for her mother returning to work this week. [name] [name] shares that her aunt [name] [name] is planned to bring her mother from [place] for discussion today (her mother does not drive in [place]). I discussed with [name] [name] the care involved with LVAD and the assistance she will need at home. We discussed the need for her family and caregiver support to be able to be here at the hospital to learn how to complete sterile dressing changes and how to manage the LVAD equipment. We discussed that this is going to be crucial for them to be here to learn so they can help her at home in order for her to be successful with LVAD. [name] [name] understands.   [name] [name] has good questions about LVAD. She is motivated to proceed and work on having improvement to ultimately return home to be with her 11 year old daughter. [name] [name] wants more time but she is also clear about her desire for quality of life. She asks insightful questions regarding LVAD and expectations. She asks about RV failure and we discussed the possibility of requiring ongoing milrinone  now or in the future. We did discuss that sometimes the RV failure can progress and ultimately lead us  towards an end of life trajectory but our hopes is that her RV is preserved enough for LVAD to be beneficial and provide her with more quality of life and time with her daughter.   We discussed in detail while thoroughly reviewing Advance  Directive. [name] [name] wishes to make her HCPOA 1) mother [name] [name] 2) aunt [name] [name]. We discussed free text section and I explained that some people will clarify their wishes for cremation/burial, length of life support desired, or interventions they know they will never want (such as HD as an example). She does not want her life prolonged artificially in the 3 scenarios. She does not believe she wants artificial nutrition/hydration in those scenarios either. She would be willing for Cortrak and aggressive support as indicated following LVAD with hopes of getting her to an improved condition and back home. However, she would not want ongoing support if not improving. She is hoping to review and discuss this with her family.   All questions/concerns addressed. Emotional support provided. Discussed with CSW [name] [name] and VAD coordinator [name].   Exam: Alert, oriented. Sitting up in recliner. Overall spirits seem good - improved from previous visit. No distress. Generalized fatigue. Breathing regular, unlabored. Abd soft.   Plan: - Hopeful for LVAD - Awaiting CSW eval with caregivers  50 min  [name], NP Palliative Medicine Team Pager [phone] (Please see [email or web address] for schedule) Team Phone [phone]

## 2023-07-21 NOTE — Progress Notes (Addendum)
 CSW left VM fore [name], patients aunt, [phone]) to confirm she would be coming to complete the LVAD assessment today.   10:14AM Patients Aunt, [name], called CSW and informed that a family member has passed and she will be attending their funeral today and will not be available to meet.   Yuette Putnam, MSW, LCSWA Transitions of Care [phone]

## 2023-07-21 NOTE — Progress Notes (Signed)
 Pt set up on cpap per home settings of 11cmH2O. Upon placing, pt states she is not ready yet to wear. Pt instructed to have RN call when she is ready. Verbalizes understanding. RT will continue to be available as needed.

## 2023-07-22 ENCOUNTER — Telehealth (HOSPITAL_COMMUNITY): Payer: Self-pay | Admitting: Unknown Physician Specialty

## 2023-07-22 ENCOUNTER — Inpatient Hospital Stay (HOSPITAL_COMMUNITY)

## 2023-07-22 DIAGNOSIS — Z7189 Other specified counseling: Secondary | ICD-10-CM | POA: Diagnosis not present

## 2023-07-22 DIAGNOSIS — I5023 Acute on chronic systolic (congestive) heart failure: Secondary | ICD-10-CM

## 2023-07-22 DIAGNOSIS — Z515 Encounter for palliative care: Secondary | ICD-10-CM | POA: Diagnosis not present

## 2023-07-22 DIAGNOSIS — I5020 Unspecified systolic (congestive) heart failure: Secondary | ICD-10-CM | POA: Diagnosis not present

## 2023-07-22 DIAGNOSIS — R570 Cardiogenic shock: Secondary | ICD-10-CM | POA: Diagnosis not present

## 2023-07-22 LAB — COOXEMETRY PANEL
Carboxyhemoglobin: 2.1 % — ABNORMAL HIGH (ref 0.5–1.5)
Methemoglobin: <0.7 % (ref 0.0–1.5)
O2 Saturation: 64.3 %
Total hemoglobin: 9.6 g/dL — ABNORMAL LOW (ref 12.0–16.0)

## 2023-07-22 LAB — BASIC METABOLIC PANEL WITH GFR
Anion gap: 11 (ref 5–15)
BUN: 24 mg/dL — ABNORMAL HIGH (ref 6–20)
CO2: 30 mmol/L (ref 22–32)
Calcium: 9.3 mg/dL (ref 8.9–10.3)
Chloride: 91 mmol/L — ABNORMAL LOW (ref 98–111)
Creatinine, Ser: 1.43 mg/dL — ABNORMAL HIGH (ref 0.44–1.00)
GFR, Estimated: 46 mL/min — ABNORMAL LOW (ref >60)
Glucose, Bld: 94 mg/dL (ref 70–99)
Potassium: 4.3 mmol/L (ref 3.5–5.1)
Sodium: 132 mmol/L — ABNORMAL LOW (ref 135–145)

## 2023-07-22 LAB — CBC
HCT: 30.4 % — ABNORMAL LOW (ref 36.0–46.0)
Hemoglobin: 9.3 g/dL — ABNORMAL LOW (ref 12.0–15.0)
MCH: 27 pg (ref 26.0–34.0)
MCHC: 30.6 g/dL (ref 30.0–36.0)
MCV: 88.1 fL (ref 80.0–100.0)
Platelets: 124 10*3/uL — ABNORMAL LOW (ref 150–400)
RBC: 3.45 MIL/uL — ABNORMAL LOW (ref 3.87–5.11)
RDW: 21.9 % — ABNORMAL HIGH (ref 11.5–15.5)
WBC: 7.8 10*3/uL (ref 4.0–10.5)
nRBC: 0 % (ref 0.0–0.2)

## 2023-07-22 LAB — GLUCOSE, CAPILLARY
Glucose-Capillary: 164 mg/dL — ABNORMAL HIGH (ref 70–99)
Glucose-Capillary: 117 mg/dL — ABNORMAL HIGH (ref 70–99)
Glucose-Capillary: 129 mg/dL — ABNORMAL HIGH (ref 70–99)
Glucose-Capillary: 93 mg/dL (ref 70–99)

## 2023-07-22 LAB — ECHOCARDIOGRAM LIMITED
Height: 70 in
Weight: 3865.99 [oz_av]

## 2023-07-22 LAB — LACTATE DEHYDROGENASE: LDH: 582 U/L — ABNORMAL HIGH (ref 98–192)

## 2023-07-22 LAB — FACTOR 5 LEIDEN

## 2023-07-22 LAB — HEPARIN LEVEL (UNFRACTIONATED)
Heparin Unfractionated: 0.29 [IU]/mL — ABNORMAL LOW (ref 0.30–0.70)
Heparin Unfractionated: 0.21 [IU]/mL — ABNORMAL LOW (ref 0.30–0.70)

## 2023-07-22 LAB — MAGNESIUM: Magnesium: 2 mg/dL (ref 1.7–2.4)

## 2023-07-22 MED ORDER — POTASSIUM CHLORIDE CRYS ER 20 MEQ PO TBCR
40.0000 meq | EXTENDED_RELEASE_TABLET | Freq: Four times a day (QID) | ORAL | Status: AC
  Administered 2023-07-22 (×2): 40 meq via ORAL
  Filled 2023-07-22 (×2): qty 2

## 2023-07-22 MED ORDER — POTASSIUM CHLORIDE CRYS ER 20 MEQ PO TBCR: 40.0000 meq | EXTENDED_RELEASE_TABLET | Freq: Once | ORAL | Status: DC

## 2023-07-22 MED ORDER — SODIUM CHLORIDE 0.9% IV SOLUTION
INTRAVENOUS | Status: DC | PRN
Start: 2023-07-22 — End: 2023-07-24

## 2023-07-22 MED ORDER — METOLAZONE 2.5 MG PO TABS
2.5000 mg | ORAL_TABLET | Freq: Once | ORAL | Status: AC
  Administered 2023-07-22: 2.5 mg via ORAL
  Filled 2023-07-22: qty 1

## 2023-07-22 MED ORDER — ALPRAZOLAM 0.25 MG PO TABS
0.2500 mg | ORAL_TABLET | Freq: Three times a day (TID) | ORAL | Status: DC | PRN
  Administered 2023-07-22 – 2023-07-23 (×2): 0.25 mg via ORAL
  Filled 2023-07-22 (×2): qty 1

## 2023-07-22 NOTE — Progress Notes (Signed)
   Inpatient Rehabilitation Admissions Coordinator   Patient ambulating 900 feet with RW at supervision level. We will sign off at this time as CIR not needed. Noted LVAD workup. Please re consult if needed. TOC made aware.  Heron Leavell, RN, MSN Rehab Admissions Coordinator [phone] 07/22/2023 11:24 AM

## 2023-07-22 NOTE — Plan of Care (Signed)

## 2023-07-22 NOTE — Progress Notes (Signed)
 Echocardiogram 2D Echocardiogram has been performed.  [name] [name] Sybel Standish RDCS 07/22/2023, 9:58 AM

## 2023-07-22 NOTE — Progress Notes (Addendum)
 Patient ID: [name] [name], female   DOB: 1975-03-02, 48 y.o.   MRN: [id number]     Advanced Heart Failure Rounding Note  Cardiologist: Ria Commander, DO  Chief Complaint: Cardiogenic Shock Subjective:    6/17: mTEER with sev posterior leaflet restriction resulting in tear. IABP + PAC. PCWP 40, v up to 60 6/24 : Aggressive diuresed. Started on lasix  drip. Brisk diuresis noted.  6/30: Echo with EF 25-30%, RV moderately dysfunctional/mildly dilated, Mitraclip present with severe MR, mild-moderate TR, dilated IVC.   I/Os net negative 4128 cc with Lasix  IV + metolazone  dose. She is on milrinone  0.25. Creatinine higher at 1.43.   NSR on amiodarone , occasional PVCs.   She has been walking, feels good/no complaints.   Impella  P9 Flow 5.1 L/min LDH 552 => 578 => 582 Plts 109 => 110 => 124 Hepatin gtt ongoing  Swan numbers: RA 12-13 PA 76/26 Unable to wedge CI 2.4 SVR 927 Co-ox 64%  Objective:    Weight Range: 109.6 kg Body mass index is 34.67 kg/m.   Vital Signs:   Temp:  [97.9 F (36.6 C)-99 F (37.2 C)] 98.1 F (36.7 C) (07/01 0800) Pulse Rate:  [56-175] 79 (07/01 0800) Resp:  [12-38] 24 (07/01 0800) BP: (49-138)/(20-93) 92/75 (07/01 0800) SpO2:  [70 %-94 %] 90 % (07/01 0800) Weight:  [109.6 kg] 109.6 kg (07/01 0500) Last BM Date : 07/19/23  Weight change: Filed Weights   07/10/23 0349 07/17/23 0615 07/22/23 0500  Weight: 111.4 kg 106.8 kg 109.6 kg   Intake/Output:  Intake/Output Summary (Last 24 hours) at 07/22/2023 0848 Last data filed at 07/22/2023 0843 Gross per 24 hour  Intake 908.93 ml  Output 5275 ml  Net -4366.07 ml   Physical Exam   Vitals:   07/22/23 0743 07/22/23 0800  BP:  92/75  Pulse: 82 79  Resp: (!) 27 (!) 24  Temp: 98.2 F (36.8 C) 98.1 F (36.7 C)  SpO2: 94% 90%   General: NAD Neck: JVP 12 cm, no thyromegaly or thyroid  nodule.  Lungs: Clear to auscultation bilaterally with normal respiratory effort. CV: Nondisplaced PMI.   Heart regular S1/S2, no S3/S4, 3/6 HSM apex.  Trace ankle edema.  Abdomen: Soft, nontender, no hepatosplenomegaly, no distention.  Skin: Intact without lesions or rashes.  Neurologic: Alert and oriented x 3.  Psych: Normal affect. Extremities: No clubbing or cyanosis.  HEENT: Normal.   Telemetry  NSR 80s-90s with occasional PVCs (personally reviewed)  Labs    CBC Recent Labs    07/21/23 0521 07/22/23 0500  WBC 7.7 7.8  HGB 9.3* 9.3*  HCT 30.2* 30.4*  MCV 87.0 88.1  PLT 110* 124*   Basic Metabolic Panel Recent Labs    93/69/74 0521 07/22/23 0500  NA 130* 132*  K 3.9 4.3  CL 90* 91*  CO2 29 30  GLUCOSE 112* 94  BUN 22* 24*  CREATININE 1.17* 1.43*  CALCIUM  9.3 9.3  MG 2.1 2.0   Liver Function Tests No results for input(s): AST, ALT, ALKPHOS, BILITOT, PROT, ALBUMIN  in the last 72 hours.   BNP (last 3 results) Recent Labs    05/28/23 1224 06/30/23 1218 07/04/23 1057  BNP 1,155.3* 600.6* 612.0*   Fasting Lipid Panel No results for input(s): CHOL, HDL, LDLCALC, TRIG, CHOLHDL, LDLDIRECT in the last 72 hours.   Medications:    Scheduled Medications:  acetaminophen   500 mg Oral Q6H   amiodarone   400 mg Oral BID   Chlorhexidine  Gluconate Cloth  6 each  Topical Daily   feeding supplement  237 mL Oral TID BM   fluticasone   2 spray Each Nare Daily   furosemide   80 mg Intravenous BID   hydrALAZINE   10 mg Oral Q8H   insulin  aspart  0-9 Units Subcutaneous Q4H   loratadine   10 mg Oral Daily   metolazone   2.5 mg Oral Once   multivitamin with minerals  1 tablet Oral Daily   pantoprazole   40 mg Oral Daily   polyethylene glycol  17 g Oral BID   senna-docusate  2 tablet Oral BID   spironolactone   25 mg Oral Daily   Infusions:  heparin  2,300 Units/hr (07/22/23 0800)   milrinone  0.25 mcg/kg/min (07/22/23 0800)   sodium bicarbonate  25 mEq (Impella PURGE) in dextrose  5 % 1000 mL bag     PRN Medications: acetaminophen , albuterol , ALPRAZolam ,  morphine  injection, ondansetron  (ZOFRAN ) IV, mouth rinse, oxyCODONE , oxyCODONE , sodium chloride   Patient Profile   48 y.o. female with history of chronic systolic HF with biventricular dysfunction on home inotrope, severe MR, hx PE/DVT, obesity, CAD, anemia, tobacco use. Presented for mTEER on 07/09/23. Worsening MR leading to cardiogenic shock.  Assessment/Plan  Cardiogenic shock: due to torrential MR s/p mTEER complicated by severely restricted posterior leaflet that was damaged during repair attempt, IABP and nipride  needed to stabilize. Transitioned to Impella 5.5. Consideration for LVAD workup, but she has significant social concerns. Not a transplant candidate with tobacco use.  I do not believe that she would be able to survive without LVAD support, the alternative is comfort care.  - Impella 5.5 at P9 flowing 5.1 LPM. Plts and LDH stable.  Will get limited echo today to assess position again, was close to mitral valve after adjustment yesterday, want to reassess => Echo showed stable Impella placement, near MV but moving freely.  I decided not to move it.  LV systolic function did look improved from prior in 30-35% range, there is thinning/akinesis of the inferolateral wall, mild-moderate RV dysfunction, severe MR, dilated IVC.   - Reviewing prior echoes, the inferolateral wall has been akinetic long-term.  Cath in 5/23 showed mild nonobstructive CAD. Patient has never had cardiac MRI but cannot get one at this point with Impella.  - I/Os net negative -4128 with Lasix  80 mg IV bid + metolazone .  PA pressures remain high but lower today than yesterday, CVP down to 12-13.  Will give Lasix  80 mg IV bid and metolazone  2.5 x 1 again today, replace K. .  - Adequate CI by thermo and co-ox.  Continue milrinone  0.25 mcg/kg/min.  - Can continue hydralazine  10mg  TID for afterload reduction.  -  I discussed LVAD + MV replacement with Dr. Lucas, needs 1 tooth out but concerned that waiting until next week  to do this is not worth the risk.  Will continue aggressive PT with plan to discuss [facility] this Wednesday, finishing formal evaluation by LVAD team. RV function appears adequate currently. Possible LVAD surgery Thursday.  With torrential MR and torn leaflet, she will need MV replacement as well.  She may need Protek Duo immediately post-op depending on how RV looks.  - PFTs with severely reduced volumes. I feel this is out of proportion to her clinically. I suspect this may be due to poor effort and weakness. In addition, CT chest does not demonstrating significant parenchymal lung disease.    Acute on chronic systolic HF with biventricular dysfunction - stage D cardiomyopathy - nonobstructive CAD on cath 2023.  - RHC  5/25 on milrinone  0.25: severe pulmonary venous HTN, severely elevated PCWP with prominent v waves, preserved CO - I think she can come off milrinone  from a RV standpoint; however, I worry about her elevated PA pressures. Will continue 0.25mcg/kg/min; mixed venous 64%.    Torrential MR - See above - Improved with unloading, extubated 6/20 - Will need mitral valve replacement with LVAD.    Acute hypoxic respiratory failure - In setting of acute on chronic CHF and severe/torrential  - Improved.   Hx of PE/DVT w/ IVC filter - Evaluated at [facility] where thrombophilia work-up was negative (Lupus inhibitor, anticardiolipin antibodies, and anti beta-2  glycoprotein antibodies were negative; protein C activity 85%, protein S activity 76%, factor V Leiden, and prothrombin gene mutation ) - IVC filter in place; could not be removed due to significant thrombus burden on filter at [facility]  - Heparin  gtt for now with Impella and potential procedures   CAD - LHC in 5/23 w/ nonobstructive CAD involving RCA - No aspirin  with need for anticoagulation.  Tobacco use - Smoking 3 cigarettes/day at time of last follow-up.  Anemia  - Hgb stable - Transfuse for HGB < 8. No obvious source.   Headache,  Severe - CT yesterday (-) - No HA this morning.   PVCs - Continue PO amio.   AKI - Creatinine up to 1.4 with diuresis, follow closely.  Will aggressively diurese 1 more day.   VAD work up underway.  Palliative Care consulted.   Length of Stay: 45  Ezra Shuck, MD  07/22/2023, 8:48 AM  Advanced Heart Failure Team Pager [phone] (M-F; 7a - 5p)  Please contact [facility] for night-coverage after hours (5p -7a ) and weekends on [email or web address]  CRITICAL CARE Performed by: Ezra Shuck   Total critical care time: 45 minutes  Critical care time was exclusive of separately billable procedures and treating other patients.  Critical care was necessary to treat or prevent imminent or life-threatening deterioration.  Critical care was time spent personally by me on the following activities: development of treatment plan with patient and/or surrogate as well as nursing, discussions with consultants, evaluation of patient's response to treatment, examination of patient, obtaining history from patient or surrogate, ordering and performing treatments and interventions, ordering and review of laboratory studies, ordering and review of radiographic studies, pulse oximetry and re-evaluation of patient's condition.

## 2023-07-22 NOTE — Progress Notes (Signed)
 Received notification from [facility] for approval for CPT [id number] - LVAD implant. Auth # [id number]. Auth is valid until 01/22/24.  Lauraine Ip RN, BSN VAD Coordinator 24/7 Pager [phone]

## 2023-07-22 NOTE — Progress Notes (Addendum)
 PHARMACY - ANTICOAGULATION CONSULT NOTE  Pharmacy Consult for heparin  Indication: IABP>impella 5.5 + LLE DVT  Allergies  Allergen Reactions   Sulfa  Antibiotics Itching, Swelling and Rash    Lip swelling    Patient Measurements: Height: 5' 10 (177.8 cm) Weight: 109.6 kg (241 lb 10 oz) IBW/kg (Calculated) : 68.5 HEPARIN  DW (KG): 93  Vital Signs: Temp: 98.2 F (36.8 C) (07/01 0630) BP: 104/69 (07/01 0630) Pulse Rate: 82 (07/01 0630)  Labs: Recent Labs    07/20/23 0520 07/21/23 0521 07/22/23 0500  HGB 9.4* 9.3* 9.3*  HCT 30.8* 30.2* 30.4*  PLT 109* 110* 124*  HEPARINUNFRC 0.44 0.26* 0.29*  CREATININE 1.16* 1.17* 1.43*    Estimated Creatinine Clearance: 65.9 mL/min (A) (by C-G formula based on SCr of 1.43 mg/dL (H)).  Assessment: 8 yof who presented for mTEER complicated with torn mitral valve leaflet resulting in torrential MR and cardiogenic shock requiring IABP. On warfarin PTA for hx PE (LD 6/12) - was on enoxaparin  while warfarin on hold for procedure (LD 6/17).  Heparin  infusion for IABP restarted 2 hours after procedure 6/18. Impella placed 6/19.   On 6/27, patient found to have chronic DVT in the left femoral vein and left popliteal vein.   Heparin  level 0.29 close to therapeutic on heparin  2250 units/hr.  Heparin  running into central line - pause to drawn labs also from central line - expect some fluctuations - no PIV access.  No issues with infusion running or signs of bleeding per RN.  Hgb stable at 9s, PLT improving 120s. LDH elevated but stable 500s.  Impella remains at P9.   Goal of Therapy:  Heparin  level 0.3-0.5 units/ml Monitor platelets by anticoagulation protocol: Yes   Plan:  Increase heparin  infusion slightly to 2300 units/hr Daily heparin  level and CBC (monitor platelets and LDH) Monitor for signs/symptoms of bleeding  7/1 Update - confirmatory heparin  level 0.21.  Infusion paused x10 min and flushed before drawing (running through R  subclavian, drawn from R pulm cath).  Plan to draw level peripherally tomorrow to confirm therapeutic.   Maurilio Fila, PharmD Clinical Pharmacist 07/22/2023  7:31 AM

## 2023-07-22 NOTE — Progress Notes (Signed)
 CSW left VM for [name] (mother) to review LVAD assessment questions. CSW was informed yesterday that family will be attending a funeral today.   Martinique Pizzimenti, MSW, LCSWA Transitions of Care [phone]

## 2023-07-22 NOTE — Progress Notes (Signed)
 Palliative:  HPI: 48 y.o. female  with past medical history of biventricular heart failure on home inotrope, severe MR, h/o PE/DVT, CAD, obesity, anemia, tobacco use admitted on 07/09/2023 for scheduled mTEER completed 6/17 but complicated by severely restricted posterior leaflet damaged during repair attempt. Requiring IABP and nipride . Being evaluated for LVAD candidacy although there are concerns noted.    I met today at [name] [name] bedside along with RN. She is very sleepy - has been since receiving Xanax  earlier. Decreased Xanax  dosage at request of RN and given prolonged lethargy to allow for more opportunity to increase activity and intake. [name] [name] is too sleepy to talk today. She gives me permission to call and further explain the seriousness of her situation and LVAD responsibilities to her mother.   I was able to connect with mother, [name] [name], via telephone. I discussed with [name] [name] her knowledge of LVAD and the role of caregiver. Unfortunately, [name] [name] had no idea of the severity of [name] [name]'s condition and the extensive support required from her and family for [name] [name] to be successful with LVAD. We reviewed caregiver responsibility to be present in post-op period to learn sterile dressing change and management of LVAD equipment. We discussed that [name] [name] will need extensive care expected for the first 6-8 weeks after returning home from the hospital. I explained that this will be a big change and adjustment for [name] [name] as well as family. I shared also with [name] [name] that LVAD is [name] [name]'s only option to help her survive. [name] [name] is overwhelmed but taking in information. We also discussed the importance for her to be present tomorrow to discuss further and meet with CSW. [name] [name] is willing and planning to come but is reliant on her sister [name] [name] to bring her to the hospital. She requests that I touch base with [name] [name] to explain this to her as well. She does report willingness to act as caregiver and be  present to learn care but has concerns about her transportation to the hospital and is overwhelmed as she did not know before our conversation the extensiveness of LVAD procedure and care.   I updated VAD coordinator [name] [name] and CSW [name] [name].   Update: I was able to connect also with aunt [name] [name] along with [name] [name] VAD coordinator. [name] [name] is current leaving funeral and is somewhat distracted during conversation. She has many questions and has also not been aware of plans, prognosis, and care involved with LVAD. She asked about [name] [name]'s willingness for LVAD and other options. I was very clear that this is [name] [name]'s only option and without this our only other option would be comfort care. We discussed the temporary nature of Impella and that we would not expect [name] [name] to survive off this machine more than hours to days most likely. [name] [name] does not feel comfortable being a caregiver but does share she will help provide [name] [name] with ride to the hospital so she can learn caregiver responsibilities. [name] [name] shares that there are other family members who are CNAs and she questions if they can help with caregiver role as well. She plans to be at the hospital with [name] [name] tomorrow ~10-11 am.   All questions/concerns addressed. Emotional support provided.   Exam: Lethargic today. No distress. Generalized fatigue. Impella in place. Breathing regular, unlabored. Abd soft.   Plan: - Hopeful for LVAD - I plan to follow up tomorrow with family and to complete Advance Directive  65 min  [name] Kitty, NP Palliative Medicine Team Pager [phone] (Please see [email or web address] for schedule) Team Phone [phone]

## 2023-07-22 NOTE — Progress Notes (Signed)
 Pt not ready to place CPAP. Pt instructed to call when ready. RT will continue to be available as needed.

## 2023-07-22 NOTE — Progress Notes (Addendum)
 Brief MCS Note:   VAD Coordinator met with pt and brought current VAD patient to bedside to answer questions regarding life with VAD per pt's request. All questions answered and pt very appreciative of visit. Pt understands that candidacy for VAD will be determined tomorrow morning at [facility].   [name] Lunger RN, BSN VAD Coordinator 24/7 Pager [phone]

## 2023-07-22 NOTE — Telephone Encounter (Signed)
 error

## 2023-07-23 ENCOUNTER — Encounter (HOSPITAL_COMMUNITY): Payer: Self-pay | Admitting: Cardiology

## 2023-07-23 ENCOUNTER — Other Ambulatory Visit: Payer: Self-pay

## 2023-07-23 DIAGNOSIS — I509 Heart failure, unspecified: Secondary | ICD-10-CM

## 2023-07-23 DIAGNOSIS — Z7189 Other specified counseling: Secondary | ICD-10-CM | POA: Diagnosis not present

## 2023-07-23 DIAGNOSIS — Z515 Encounter for palliative care: Secondary | ICD-10-CM | POA: Diagnosis not present

## 2023-07-23 DIAGNOSIS — I5020 Unspecified systolic (congestive) heart failure: Secondary | ICD-10-CM | POA: Diagnosis not present

## 2023-07-23 DIAGNOSIS — R570 Cardiogenic shock: Secondary | ICD-10-CM | POA: Diagnosis not present

## 2023-07-23 LAB — HEPATIC FUNCTION PANEL
ALT: 14 U/L (ref 0–44)
AST: 33 U/L (ref 15–41)
Albumin: 2.7 g/dL — ABNORMAL LOW (ref 3.5–5.0)
Alkaline Phosphatase: 74 U/L (ref 38–126)
Bilirubin, Direct: 0.5 mg/dL — ABNORMAL HIGH (ref 0.0–0.2)
Indirect Bilirubin: 0.7 mg/dL (ref 0.3–0.9)
Total Bilirubin: 1.2 mg/dL (ref 0.0–1.2)
Total Protein: 7.6 g/dL (ref 6.5–8.1)

## 2023-07-23 LAB — BASIC METABOLIC PANEL WITH GFR
Anion gap: 9 (ref 5–15)
BUN: 25 mg/dL — ABNORMAL HIGH (ref 6–20)
CO2: 28 mmol/L (ref 22–32)
Calcium: 9.4 mg/dL (ref 8.9–10.3)
Chloride: 94 mmol/L — ABNORMAL LOW (ref 98–111)
Creatinine, Ser: 1.46 mg/dL — ABNORMAL HIGH (ref 0.44–1.00)
GFR, Estimated: 45 mL/min — ABNORMAL LOW (ref >60)
Glucose, Bld: 107 mg/dL — ABNORMAL HIGH (ref 70–99)
Potassium: 4.5 mmol/L (ref 3.5–5.1)
Sodium: 131 mmol/L — ABNORMAL LOW (ref 135–145)

## 2023-07-23 LAB — LACTATE DEHYDROGENASE: LDH: 565 U/L — ABNORMAL HIGH (ref 98–192)

## 2023-07-23 LAB — COOXEMETRY PANEL
Carboxyhemoglobin: 2.4 % — ABNORMAL HIGH (ref 0.5–1.5)
Methemoglobin: <0.7 % (ref 0.0–1.5)
O2 Saturation: 55.2 %
Total hemoglobin: 9.7 g/dL — ABNORMAL LOW (ref 12.0–16.0)

## 2023-07-23 LAB — CBC
HCT: 30.5 % — ABNORMAL LOW (ref 36.0–46.0)
Hemoglobin: 9.2 g/dL — ABNORMAL LOW (ref 12.0–15.0)
MCH: 27 pg (ref 26.0–34.0)
MCHC: 30.2 g/dL (ref 30.0–36.0)
MCV: 89.4 fL (ref 80.0–100.0)
Platelets: 120 10*3/uL — ABNORMAL LOW (ref 150–400)
RBC: 3.41 MIL/uL — ABNORMAL LOW (ref 3.87–5.11)
RDW: 21.6 % — ABNORMAL HIGH (ref 11.5–15.5)
WBC: 9.3 10*3/uL (ref 4.0–10.5)
nRBC: 0 % (ref 0.0–0.2)

## 2023-07-23 LAB — POCT I-STAT 7, (LYTES, BLD GAS, ICA,H+H)
Acid-Base Excess: 5 mmol/L — ABNORMAL HIGH (ref 0.0–2.0)
Bicarbonate: 28.9 mmol/L — ABNORMAL HIGH (ref 20.0–28.0)
Calcium, Ion: 1.25 mmol/L (ref 1.15–1.40)
HCT: 30 % — ABNORMAL LOW (ref 36.0–46.0)
Hemoglobin: 10.2 g/dL — ABNORMAL LOW (ref 12.0–15.0)
O2 Saturation: 95 %
Potassium: 4.6 mmol/L (ref 3.5–5.1)
Sodium: 131 mmol/L — ABNORMAL LOW (ref 135–145)
TCO2: 30 mmol/L (ref 22–32)
pCO2 arterial: 38.3 mmHg (ref 32–48)
pH, Arterial: 7.486 — ABNORMAL HIGH (ref 7.35–7.45)
pO2, Arterial: 69 mmHg — ABNORMAL LOW (ref 83–108)

## 2023-07-23 LAB — GLUCOSE, CAPILLARY
Glucose-Capillary: 103 mg/dL — ABNORMAL HIGH (ref 70–99)
Glucose-Capillary: 106 mg/dL — ABNORMAL HIGH (ref 70–99)
Glucose-Capillary: 110 mg/dL — ABNORMAL HIGH (ref 70–99)
Glucose-Capillary: 142 mg/dL — ABNORMAL HIGH (ref 70–99)
Glucose-Capillary: 72 mg/dL (ref 70–99)
Glucose-Capillary: 78 mg/dL (ref 70–99)

## 2023-07-23 LAB — MAGNESIUM: Magnesium: 1.8 mg/dL (ref 1.7–2.4)

## 2023-07-23 LAB — HEPARIN LEVEL (UNFRACTIONATED): Heparin Unfractionated: 0.24 [IU]/mL — ABNORMAL LOW (ref 0.30–0.70)

## 2023-07-23 LAB — PREPARE RBC (CROSSMATCH)

## 2023-07-23 MED ORDER — PHENYLEPHRINE HCL-NACL 20-0.9 MG/250ML-% IV SOLN
0.0000 ug/min | INTRAVENOUS | Status: DC
  Filled 2023-07-23: qty 250

## 2023-07-23 MED ORDER — CEFAZOLIN SODIUM-DEXTROSE 2-4 GM/100ML-% IV SOLN
2.0000 g | INTRAVENOUS | Status: AC
  Administered 2023-07-24: 2 g via INTRAVENOUS
  Filled 2023-07-23: qty 100

## 2023-07-23 MED ORDER — SODIUM CHLORIDE 0.9 % IV SOLN
600.0000 mg | INTRAVENOUS | Status: AC
  Administered 2023-07-24: 600 mg via INTRAVENOUS
  Filled 2023-07-23: qty 10

## 2023-07-23 MED ORDER — MAGNESIUM SULFATE 2 GM/50ML IV SOLN
2.0000 g | Freq: Once | INTRAVENOUS | Status: AC
  Administered 2023-07-23: 2 g via INTRAVENOUS
  Filled 2023-07-23: qty 50

## 2023-07-23 MED ORDER — CHLORHEXIDINE GLUCONATE CLOTH 2 % EX PADS
6.0000 | MEDICATED_PAD | Freq: Once | CUTANEOUS | Status: AC
  Administered 2023-07-24: 6 via TOPICAL

## 2023-07-23 MED ORDER — SODIUM CHLORIDE 0.9 % IV SOLN
600.0000 mg | INTRAVENOUS | Status: DC
  Filled 2023-07-23: qty 10

## 2023-07-23 MED ORDER — TEMAZEPAM 15 MG PO CAPS: 15.0000 mg | ORAL_CAPSULE | Freq: Once | ORAL | Status: DC | PRN

## 2023-07-23 MED ORDER — TRANEXAMIC ACID 1000 MG/10ML IV SOLN
1.5000 mg/kg/h | INTRAVENOUS | Status: AC
  Administered 2023-07-24: 1.5 mg/kg/h via INTRAVENOUS
  Filled 2023-07-23: qty 25

## 2023-07-23 MED ORDER — DEXMEDETOMIDINE HCL IN NACL 400 MCG/100ML IV SOLN
0.1000 ug/kg/h | INTRAVENOUS | Status: AC
  Administered 2023-07-24: .7 ug/kg/h via INTRAVENOUS
  Filled 2023-07-23: qty 100

## 2023-07-23 MED ORDER — METOLAZONE 5 MG PO TABS
5.0000 mg | ORAL_TABLET | Freq: Once | ORAL | Status: AC
  Administered 2023-07-23: 5 mg via ORAL
  Filled 2023-07-23: qty 1

## 2023-07-23 MED ORDER — TRANEXAMIC ACID (OHS) BOLUS VIA INFUSION
15.0000 mg/kg | INTRAVENOUS | Status: AC
  Administered 2023-07-24: 1660.5 mg via INTRAVENOUS

## 2023-07-23 MED ORDER — DOBUTAMINE-DEXTROSE 4-5 MG/ML-% IV SOLN
2.0000 ug/kg/min | INTRAVENOUS | Status: DC
  Filled 2023-07-23: qty 250

## 2023-07-23 MED ORDER — DIAZEPAM 5 MG PO TABS
5.0000 mg | ORAL_TABLET | Freq: Once | ORAL | Status: AC
  Administered 2023-07-24: 5 mg via ORAL
  Filled 2023-07-23: qty 1

## 2023-07-23 MED ORDER — CEFAZOLIN SODIUM-DEXTROSE 3-4 GM/150ML-% IV SOLN
3.0000 g | INTRAVENOUS | Status: DC
  Filled 2023-07-23: qty 150

## 2023-07-23 MED ORDER — POTASSIUM CHLORIDE 2 MEQ/ML IV SOLN
80.0000 meq | INTRAVENOUS | Status: DC
  Filled 2023-07-23: qty 40

## 2023-07-23 MED ORDER — VASOPRESSIN 20 UNITS/100 ML INFUSION FOR SHOCK
0.0400 [IU]/min | INTRAVENOUS | Status: AC
  Administered 2023-07-24: .03 [IU]/min via INTRAVENOUS
  Filled 2023-07-23: qty 100

## 2023-07-23 MED ORDER — CHLORHEXIDINE GLUCONATE CLOTH 2 % EX PADS: 6.0000 | MEDICATED_PAD | Freq: Once | CUTANEOUS | Status: DC

## 2023-07-23 MED ORDER — BISACODYL 5 MG PO TBEC: 5.0000 mg | DELAYED_RELEASE_TABLET | Freq: Once | ORAL | Status: DC

## 2023-07-23 MED ORDER — MUPIROCIN 2 % EX OINT
1.0000 | TOPICAL_OINTMENT | Freq: Two times a day (BID) | CUTANEOUS | Status: AC
  Administered 2023-07-23 (×2): 1 via NASAL
  Filled 2023-07-23: qty 22

## 2023-07-23 MED ORDER — HEPARIN 30,000 UNITS/1000 ML (OHS) CELLSAVER SOLUTION
Status: DC
  Filled 2023-07-23: qty 1000

## 2023-07-23 MED ORDER — NITROGLYCERIN IN D5W 200-5 MCG/ML-% IV SOLN
0.0000 ug/min | INTRAVENOUS | Status: DC
  Filled 2023-07-23: qty 250

## 2023-07-23 MED ORDER — NOREPINEPHRINE 4 MG/250ML-% IV SOLN
0.0000 ug/min | INTRAVENOUS | Status: AC
  Administered 2023-07-24: 2 ug/min via INTRAVENOUS
  Filled 2023-07-23: qty 250

## 2023-07-23 MED ORDER — FLUCONAZOLE IN SODIUM CHLORIDE 400-0.9 MG/200ML-% IV SOLN
400.0000 mg | INTRAVENOUS | Status: AC
  Administered 2023-07-24: 400 mg via INTRAVENOUS
  Filled 2023-07-23: qty 200

## 2023-07-23 MED ORDER — MAGNESIUM SULFATE 50 % IJ SOLN
40.0000 meq | INTRAMUSCULAR | Status: DC
  Filled 2023-07-23: qty 9.85

## 2023-07-23 MED ORDER — MILRINONE LACTATE IN DEXTROSE 20-5 MG/100ML-% IV SOLN
0.3000 ug/kg/min | INTRAVENOUS | Status: DC
  Filled 2023-07-23: qty 100

## 2023-07-23 MED ORDER — TRANEXAMIC ACID (OHS) PUMP PRIME SOLUTION
2.0000 mg/kg | INTRAVENOUS | Status: DC
  Filled 2023-07-23: qty 2.21

## 2023-07-23 MED ORDER — DOPAMINE-DEXTROSE 3.2-5 MG/ML-% IV SOLN
0.0000 ug/kg/min | INTRAVENOUS | Status: DC
  Filled 2023-07-23: qty 250

## 2023-07-23 MED ORDER — VANCOMYCIN HCL 1 G IV SOLR
1000.0000 mg | INTRAVENOUS | Status: DC
  Filled 2023-07-23: qty 20

## 2023-07-23 MED ORDER — VANCOMYCIN HCL 1.5 G IV SOLR
1500.0000 mg | INTRAVENOUS | Status: AC
  Administered 2023-07-24: 1500 mg via INTRAVENOUS
  Filled 2023-07-23: qty 30

## 2023-07-23 MED ORDER — INSULIN REGULAR(HUMAN) IN NACL 100-0.9 UT/100ML-% IV SOLN
INTRAVENOUS | Status: AC
  Administered 2023-07-24: 3 [IU]/h via INTRAVENOUS
  Filled 2023-07-23: qty 100

## 2023-07-23 MED ORDER — CHLORHEXIDINE GLUCONATE 0.12 % MT SOLN
15.0000 mL | Freq: Once | OROMUCOSAL | Status: AC
  Administered 2023-07-24: 15 mL via OROMUCOSAL
  Filled 2023-07-23: qty 15

## 2023-07-23 MED ORDER — MANNITOL 20 % IV SOLN
INTRAVENOUS | Status: DC
  Filled 2023-07-23: qty 13

## 2023-07-23 MED ORDER — FUROSEMIDE 10 MG/ML IJ SOLN
120.0000 mg | Freq: Two times a day (BID) | INTRAVENOUS | Status: DC
  Administered 2023-07-23: 120 mg via INTRAVENOUS
  Filled 2023-07-23: qty 2
  Filled 2023-07-23 (×3): qty 12

## 2023-07-23 MED ORDER — EPINEPHRINE HCL 5 MG/250ML IV SOLN IN NS
0.0000 ug/min | INTRAVENOUS | Status: AC
  Administered 2023-07-24: 4 ug/min via INTRAVENOUS
  Filled 2023-07-23: qty 250

## 2023-07-23 MED ORDER — MAGNESIUM SULFATE 50 % IJ SOLN: 40.0000 meq | INTRAMUSCULAR | Status: DC

## 2023-07-23 MED ORDER — FUROSEMIDE 10 MG/ML IJ SOLN
40.0000 mg | Freq: Once | INTRAMUSCULAR | Status: AC
  Administered 2023-07-23: 40 mg via INTRAVENOUS
  Filled 2023-07-23: qty 4

## 2023-07-23 NOTE — Progress Notes (Signed)
 Palliative:  HPI: 48 y.o. female with past medical history of biventricular heart failure on home inotrope, severe MR, h/o PE/DVT, CAD, obesity, anemia, tobacco use admitted on 07/09/2023 for scheduled mTEER completed 6/17 but complicated by severely restricted posterior leaflet damaged during repair attempt. Requiring IABP and nipride . Being evaluated for LVAD candidacy although there are concerns noted.     I met today with [name] with her mother and aunt, [name] and [name], at bedside. We discussed further about LVAD, expectations, risks, and care required. They had many good questions that I answered to the best of my ability. [name] is willing to learn and be primary caregiver for [name] post-op. [name] will provide [place] with transportation. There is other family that are CNAs that they will approach to see if they will assist in learning management of LVAD to assist in care as needed. I notified heart failure provider that family wishes to speak with them - no specific questions. I also notified LVAD coordinator and CSW to visit and discuss with family while at bedside.   I reviewed with [name] and family Advance Directive. We fully discussed Laureen's wishes and her desire to be provided every chance to improve and be able to go home with her daughter. She does not wish to be prolonged with artificial means if not expected to improve - she does not want her daughter to see and have to visit her in these states. She desires quality of life. Family are understanding and supportive of her wishes. I discussed with chaplain and notary who will coordinate completion of the document.   All questions/concerns addressed. Emotional support provided.   Exam: More awake and interactive today. Sitting up in recliner. Good spirits. No distress. Generalized fatigue. Impella in place. Breathing regular, unlabored. Abd soft.   Plan: - Plans for LVAD tomorrow - Advance Directive completed and will be  notarized today  55 min  [name], NP Palliative Medicine Team Pager [phone] (Please see [email or web address] for schedule) Team Phone [phone]

## 2023-07-23 NOTE — Plan of Care (Signed)
  Problem: Education: Goal: Knowledge of General Education information will improve Description: Including pain rating scale, medication(s)/side effects and non-pharmacologic comfort measures Outcome: Progressing   Problem: Health Behavior/Discharge Planning: Goal: Ability to manage health-related needs will improve Outcome: Progressing   Problem: Clinical Measurements: Goal: Ability to maintain clinical measurements within normal limits will improve Outcome: Progressing Goal: Will remain free from infection Outcome: Progressing Goal: Diagnostic test results will improve Outcome: Progressing Goal: Respiratory complications will improve Outcome: Progressing Goal: Cardiovascular complication will be avoided Outcome: Progressing   Problem: Activity: Goal: Risk for activity intolerance will decrease Outcome: Progressing   Problem: Nutrition: Goal: Adequate nutrition will be maintained Outcome: Progressing   Problem: Coping: Goal: Level of anxiety will decrease Outcome: Progressing   Problem: Elimination: Goal: Will not experience complications related to bowel motility Outcome: Progressing Goal: Will not experience complications related to urinary retention Outcome: Progressing   Problem: Pain Managment: Goal: General experience of comfort will improve and/or be controlled Outcome: Progressing   Problem: Safety: Goal: Ability to remain free from injury will improve Outcome: Progressing   Problem: Skin Integrity: Goal: Risk for impaired skin integrity will decrease Outcome: Progressing   Problem: Activity: Goal: Ability to tolerate increased activity will improve Outcome: Progressing   Problem: Respiratory: Goal: Ability to maintain a clear airway and adequate ventilation will improve Outcome: Progressing   Problem: Role Relationship: Goal: Method of communication will improve Outcome: Progressing   Problem: Cardiac: Goal: Ability to achieve and maintain  adequate cardiopulmonary perfusion will improve Outcome: Progressing Goal: Vascular access site(s) Level 0-1 will be maintained Outcome: Progressing   Problem: Fluid Volume: Goal: Ability to achieve a balanced intake and output will improve Outcome: Progressing   Problem: Physical Regulation: Goal: Complications related to the disease process, condition or treatment will be avoided or minimized Outcome: Progressing   Problem: Respiratory: Goal: Will regain and/or maintain adequate ventilation Outcome: Progressing

## 2023-07-23 NOTE — H&P (View-Only) (Signed)
 13 Days Post-Op Procedure(s) (LRB): INSERTION, CARDIAC ASSIST DEVICE, IMPELLA 5.5 (Left) ECHOCARDIOGRAM, TRANSESOPHAGEAL, INTRAOPERATIVE (N/A) Subjective:  Feels ok. Breathing well. No SOB. Ambulated around the ICU yesterday again.  Remains on milrinone  0.25 and Impella at 5L/min flow.  90's/30's with CI 2.48.  Objective: Vital signs in last 24 hours: Temp:  [98.1 F (36.7 C)-100 F (37.8 C)] 98.8 F (37.1 C) (07/02 0645) Pulse Rate:  [79-186] 94 (07/02 0645) Cardiac Rhythm: Normal sinus rhythm (07/01 1600) Resp:  [15-31] 20 (07/02 0645) BP: (84-135)/(56-95) 94/60 (07/02 0600) SpO2:  [82 %-100 %] 89 % (07/02 0645) FiO2 (%):  [21 %] 21 % (07/02 0115) Weight:  [110.7 kg] 110.7 kg (07/02 0500)  Hemodynamic parameters for last 24 hours: PAP: (65-119)/(11-60) 91/34 CVP:  [8 mmHg-38 mmHg] 15 mmHg CO:  [4.8 L/min-5.5 L/min] 4.8 L/min CI:  [2.1 L/min/m2-2.4 L/min/m2] 2.1 L/min/m2  Intake/Output from previous day: 07/01 0701 - 07/02 0700 In: 2369.7 [P.O.:1320; I.V.:744.5] Out: 2415 [Urine:2415] Intake/Output this shift: No intake/output data recorded.  General appearance: alert and cooperative Neurologic: intact Heart: regular rate and rhythm, 2/6 systolic murmur at apex Lungs: clear to auscultation bilaterally Extremities: edema mild  Lab Results: Recent Labs    07/22/23 0500 07/23/23 0417 07/23/23 0627  WBC 7.8 9.3  --   HGB 9.3* 9.2* 10.2*  HCT 30.4* 30.5* 30.0*  PLT 124* 120*  --    BMET:  Recent Labs    07/22/23 0500 07/23/23 0417 07/23/23 0627  NA 132* 131* 131*  K 4.3 4.5 4.6  CL 91* 94*  --   CO2 30 28  --   GLUCOSE 94 107*  --   BUN 24* 25*  --   CREATININE 1.43* 1.46*  --   CALCIUM  9.3 9.4  --     PT/INR: No results for input(s): LABPROT, INR in the last 72 hours. ABG    Component Value Date/Time   PHART 7.486 (H) 07/23/2023 0627   HCO3 28.9 (H) 07/23/2023 0627   TCO2 30 07/23/2023 0627   O2SAT 95 07/23/2023 0627   CBG (last 3)   Recent Labs    07/22/23 1554 07/22/23 2054 07/23/23 0018  GLUCAP 129* 93 72    Assessment/Plan: S/P Procedure(s) (LRB): INSERTION, CARDIAC ASSIST DEVICE, IMPELLA 5.5 (Left) ECHOCARDIOGRAM, TRANSESOPHAGEAL, INTRAOPERATIVE (N/A)  She was discussed at [facility] this am and consensus is that she is an acceptable candidate for Heartmate 3 LVAD. She will also require mitral valve replacement for severe mitral regurgitation with severe pulmonary hypertension and moderate RV systolic dysfunction. I discussed the procedure and expected postop course with her. I discussed the alternatives, benefits and risks; including but not limited to bleeding, blood transfusion, infection, stroke, RV failure requiring temporary support device, heart block requiring a permanent pacemaker, organ dysfunction, and death.  [name] [name] understands and agrees to proceed.  We will schedule surgery for tomorrow morning. I will remove the Impella once on bypass.  She will have swan sleeve removed from right neck today as well as the tunneled central line so that right internal jugular vein is available for RV support device if needed. She will require a left internal jugular PA catheter for surgery which will be inserted by anesthesia in the morning.   LOS: 14 days    [name] [name] 07/23/2023

## 2023-07-23 NOTE — Plan of Care (Signed)
  Problem: Skin Integrity: Goal: Risk for impaired skin integrity will decrease Outcome: Progressing   Problem: Role Relationship: Goal: Method of communication will improve Outcome: Progressing   Problem: Cardiac: Goal: Ability to achieve and maintain adequate cardiopulmonary perfusion will improve Outcome: Progressing

## 2023-07-23 NOTE — Progress Notes (Addendum)
 13 Days Post-Op Procedure(s) (LRB): INSERTION, CARDIAC ASSIST DEVICE, IMPELLA 5.5 (Left) ECHOCARDIOGRAM, TRANSESOPHAGEAL, INTRAOPERATIVE (N/A) Subjective:  Feels ok. Breathing well. No SOB. Ambulated around the ICU yesterday again.  Remains on milrinone  0.25 and Impella at 5L/min flow.  90's/30's with CI 2.48.  Objective: Vital signs in last 24 hours: Temp:  [98.1 F (36.7 C)-100 F (37.8 C)] 98.8 F (37.1 C) (07/02 0645) Pulse Rate:  [79-186] 94 (07/02 0645) Cardiac Rhythm: Normal sinus rhythm (07/01 1600) Resp:  [15-31] 20 (07/02 0645) BP: (84-135)/(56-95) 94/60 (07/02 0600) SpO2:  [82 %-100 %] 89 % (07/02 0645) FiO2 (%):  [21 %] 21 % (07/02 0115) Weight:  [110.7 kg] 110.7 kg (07/02 0500)  Hemodynamic parameters for last 24 hours: PAP: (65-119)/(11-60) 91/34 CVP:  [8 mmHg-38 mmHg] 15 mmHg CO:  [4.8 L/min-5.5 L/min] 4.8 L/min CI:  [2.1 L/min/m2-2.4 L/min/m2] 2.1 L/min/m2  Intake/Output from previous day: 07/01 0701 - 07/02 0700 In: 2369.7 [P.O.:1320; I.V.:744.5] Out: 2415 [Urine:2415] Intake/Output this shift: No intake/output data recorded.  General appearance: alert and cooperative Neurologic: intact Heart: regular rate and rhythm, 2/6 systolic murmur at apex Lungs: clear to auscultation bilaterally Extremities: edema mild  Lab Results: Recent Labs    07/22/23 0500 07/23/23 0417 07/23/23 0627  WBC 7.8 9.3  --   HGB 9.3* 9.2* 10.2*  HCT 30.4* 30.5* 30.0*  PLT 124* 120*  --    BMET:  Recent Labs    07/22/23 0500 07/23/23 0417 07/23/23 0627  NA 132* 131* 131*  K 4.3 4.5 4.6  CL 91* 94*  --   CO2 30 28  --   GLUCOSE 94 107*  --   BUN 24* 25*  --   CREATININE 1.43* 1.46*  --   CALCIUM  9.3 9.4  --     PT/INR: No results for input(s): LABPROT, INR in the last 72 hours. ABG    Component Value Date/Time   PHART 7.486 (H) 07/23/2023 0627   HCO3 28.9 (H) 07/23/2023 0627   TCO2 30 07/23/2023 0627   O2SAT 95 07/23/2023 0627   CBG (last 3)   Recent Labs    07/22/23 1554 07/22/23 2054 07/23/23 0018  GLUCAP 129* 93 72    Assessment/Plan: S/P Procedure(s) (LRB): INSERTION, CARDIAC ASSIST DEVICE, IMPELLA 5.5 (Left) ECHOCARDIOGRAM, TRANSESOPHAGEAL, INTRAOPERATIVE (N/A)  She was discussed at [facility] this am and consensus is that she is an acceptable candidate for Heartmate 3 LVAD. She will also require mitral valve replacement for severe mitral regurgitation with severe pulmonary hypertension and moderate RV systolic dysfunction. I discussed the procedure and expected postop course with her. I discussed the alternatives, benefits and risks; including but not limited to bleeding, blood transfusion, infection, stroke, RV failure requiring temporary support device, heart block requiring a permanent pacemaker, organ dysfunction, and death.  [name] [name] understands and agrees to proceed.  We will schedule surgery for tomorrow morning. I will remove the Impella once on bypass.  She will have swan sleeve removed from right neck today as well as the tunneled central line so that right internal jugular vein is available for RV support device if needed. She will require a left internal jugular PA catheter for surgery which will be inserted by anesthesia in the morning.   LOS: 14 days    [name] [name] 07/23/2023

## 2023-07-23 NOTE — Anesthesia Preprocedure Evaluation (Signed)
 Anesthesia Evaluation  Patient identified by MRN, date of birth, ID band Patient awake    Reviewed: Allergy & Precautions, NPO status , Patient's Chart, lab work & pertinent test results  Airway Mallampati: II  TM Distance: >3 FB Neck ROM: Full    Dental no notable dental hx.    Pulmonary sleep apnea and Continuous Positive Airway Pressure Ventilation , Current Smoker and Patient abstained from smoking., PE   Pulmonary exam normal        Cardiovascular hypertension, + CAD, + Past MI, +CHF and + DVT  + Valvular Problems/Murmurs (severe residual MR 2/2 failed MV clip placed 07/09/23) MR  Rhythm:Irregular Rate:Normal  ECHO 7/1:  1. Limited study for Impella cannula repositioning.  2. Limited views. There is akinesis and thinning of the posterior wall, consistent with scar. The left ventricle has moderate to severely decreased function. The left ventricle demonstrates regional wall motion abnormalities (see scoring diagram/findings for description). The left ventricular internal cavity size was moderately to severely dilated.  3. Left atrial size was severely dilated.  4. Severe mitral valve regurgitation. There is a Abbott Mitra-Clip present in the mitral position. Procedure Date: 07/09/23.  5. The inferior vena cava is dilated in size with <50% respiratory variability, suggesting right atrial pressure of 15 mmHg.  ECHO 07/21/23:  1. Left ventricular ejection fraction, by estimation, is 25 to 30%. The left ventricle has severely decreased function. The left ventricle demonstrates global hypokinesis.  2. Impella 5.5 seen in the LV cavity measuring 3.26cm at the start of the study and 4.85cm at the end of the study.  3. Right ventricular systolic function is moderately reduced. The right ventricular size is mildly enlarged.  4. There is a Abbott Mitra-Clip present in the mitral position. Procedure Date: 07/09/23. Mitral regurgitation was not  assessed.  5. Tricuspid valve regurgitation is mild to moderate.  6. The inferior vena cava is dilated in size with <50% respiratory variability, suggesting right atrial pressure of 15 mmHg.      Neuro/Psych  negative psych ROS   GI/Hepatic negative GI ROS, Neg liver ROS,,,  Endo/Other    Renal/GU negative Renal ROS  negative genitourinary   Musculoskeletal negative musculoskeletal ROS (+)    Abdominal Normal abdominal exam  (+)   Peds  Hematology  (+) Blood dyscrasia, anemia Lab Results      Component                Value               Date                      WBC                      9.3                 07/23/2023                HGB                      10.2 (L)            07/23/2023                HCT                      30.0 (L)            07/23/2023  MCV                      89.4                07/23/2023                PLT                      120 (L)             07/23/2023             Lab Results      Component                Value               Date                      NA                       131 (L)             07/23/2023                K                        4.6                 07/23/2023                CO2                      28                  07/23/2023                GLUCOSE                  107 (H)             07/23/2023                BUN                      25 (H)              07/23/2023                CREATININE               1.46 (H)            07/23/2023                CALCIUM                   9.4                 07/23/2023                EGFR                     66                  09/16/2022                GFRNONAA                 45 (L)  07/23/2023              Anesthesia Other Findings   Reproductive/Obstetrics negative OB ROS                              Anesthesia Physical Anesthesia Plan  ASA: 5  Anesthesia Plan: General   Post-op Pain Management:    Induction:  Intravenous  PONV Risk Score and Plan: 2 and Midazolam  and Treatment may vary due to age or medical condition  Airway Management Planned: Oral ETT and Mask  Additional Equipment: Arterial line, CVP, PA Cath, TEE, 3D TEE and Ultrasound Guidance Line Placement  Intra-op Plan:   Post-operative Plan: Post-operative intubation/ventilation  Informed Consent: I have reviewed the patients History and Physical, chart, labs and discussed the procedure including the risks, benefits and alternatives for the proposed anesthesia with the patient or authorized representative who has indicated his/her understanding and acceptance.     Dental advisory given  Plan Discussed with: CRNA  Anesthesia Plan Comments: (- LIJ CVC to be placed intra-op DOS)         Anesthesia Quick Evaluation

## 2023-07-23 NOTE — Progress Notes (Addendum)
 Pt [name] [name] has been discussed with the VAD Medical Review board on 07/23/23. The team feels as if the patient is a good candidate for Destination LVAD therapy. The patient meets criteria for a LVAD implant as listed below:  1) NYHA Class: IV documented on 07/09/23  2) Has a left ventricular ejection fraction (LVEF) < 25%   *EF 20-25% by echo 07/14/23  3) Must meet one of the following:   Is inotrope dependent   *On inotropes Milrinone  started 05/07/23  OR  Has a Cardiac Index (CI) < 2.2  L/min/m2 while not on inotropes:   *CI:    4) Must meet one of the following:   ______ Is on optimal medical management (OMM), based on current heart failure practice guidelines for at least 45 of the last 60 days and are failing to respond   ___X___ Has advanced heart failure for at least 14 days and are dependent on an intra-aortic balloon pump (IABP) or similar temporary mechanical circulatory support for at least 7 days      __X__ IABP inserted 07/09/23 replaced with Impella    07/10/23      __X__ Impella inserted 07/10/23  5)  Social work and palliative care evaluations demonstrate appropriate support system in place for discharge to home with a VAD and that end of life discussions have taken place. Both services have expressed no concern regarding patient's candidacy.         *Social work consult (date): 07/22/23 Luise Pan CSW        *Palliative Care Consult (date): 07/17/23 by [name] Kitty NP  6)  Primary caretaker identified that can be taught along with the patient how to manage        the VAD equipment.        *Name: [name] [name]  7)  Deemed appropriate by our financial coordinator: [name] [name]        Prior approval: Auth # [id number]   8)  VAD Coordinator, [name] [name] has met with patient and caregiver, shown them the VAD equipment and discussed with the patient and caregiver about lifestyle changes necessary for success on mechanical circulatory device.        *Met  with [name] [name] on 07/16/23.        *Consent for VAD Evaluation/Caregiver Agreement/HIPPA Release/Photo Release signed on 07/16/23   9)  Six Minute Walk:  UTO as pt on Impella 5.5   10) KCCQ: Complete 07/23/23  Pre VAD:  [place] Cardiomyopathy Questionnaire  KCCQ-12    1 a. Ability to shower/bathe Extremely limited   1 b. Ability to walk 1 block Quite a bit limited   1 c. Ability to hurry/jog Extremely limited   2. Edema feet/ankles/legs 1-2 times per week   3. Limited by fatigue 1-2 times per week   4. Limited by dyspnea 1-2 times per week   5. Sitting up / on 3+ pillows 3 or more times per week but not everyday   6. Limited enjoyment of life It has limited my enjoyment in life quite a bit   7. Rest of life w/ symptoms Mostly dissatisfied   8 a. Participation in hobbies Moderately limited   8 b. Participation in chores Slightly limited   8 c. Visiting family/friends Slightly limited       11)  Intermacs profile: 1  INTERMACS 1: Critical cardiogenic shock describes a patient who is "crashing and burning", in which a patient has life-threatening hypotension and rapidly escalating inotropic pressor  support, with critical organ hypoperfusion often confirmed by worsening acidosis and lactate levels.  INTERMACS 2: Progressive decline describes a patient who has been demonstrated "dependent" on inotropic support but nonetheless shows signs of continuing deterioration in nutrition, renal function, fluid retention, or other major status indicator. Patient profile 2 can also describe a patient with refractory volume overload, perhaps with evidence of impaired perfusion, in whom inotropic infusions cannot be maintained due to tachyarrhythmias, clinical ischemia, or other intolerance.  INTERMACS 3: Stable but inotrope dependent describes a patient who is clinically stable on mild-moderate doses of intravenous inotropes (or has a temporary circulatory support device) after repeated  documentation of failure to wean without symptomatic hypotension, worsening symptoms, or progressive organ dysfunction (usually renal). It is critical to monitor nutrition, renal function, fluid balance, and overall status carefully in order to distinguish between a   patient who is truly stable at Patient Profile 3 and a patient who has unappreciated decline rendering them Patient Profile 2. This patient may be either at home or in the hospital.      INTERMACS 4: Resting symptoms describes a patient who is at home on oral therapy but frequently has symptoms of congestion at rest or with activities of daily living (ADL). He or she may have orthopnea, shortness of breath during ADL such as dressing or bathing, gastrointestinal symptoms (abdominal discomfort, nausea, poor appetite), disabling ascites or severe lower extremity edema. This patient should be carefully considered for more intensive management and surveillance programs, which may in some cases, reveal poor compliance that would compromise outcomes with any therapy.   .   INTERMACS 5: Exertion Intolerant describes a patient who is comfortable at rest but unable to engage in any activity, living predominantly within the house or housebound. This patient has no congestive symptoms, but may have chronically elevated volume status, frequently with renal dysfunction, and may be characterized as exercise intolerant.      INTERMACS 6: Exertion Limited also describes a patient who is comfortable at rest without evidence of fluid overload, but who is able to do some mild activity. Activities of daily living are comfortable and minor activities outside the home such as visiting friends or going to a restaurant can be performed, but fatigue results within a few minutes of any meaningful physical exertion. This patient has occasional episodes of worsening symptoms and is likely to have had a hospitalization for heart failure within the past year.    INTERMACS 7: Advanced NYHA Class 3 describes a patient who is clinically stable with a reasonable level of comfortable activity, despite history of previous decompensation that is not recent. This patient is usually able to walk more than a block. Any decompensation requiring intravenous diuretics or hospitalization within the previous month should make this person a Patient Profile 6 or lower.

## 2023-07-23 NOTE — Progress Notes (Signed)
   07/23/23 0115  BiPAP/CPAP/SIPAP  $ Non-Invasive Home Ventilator  Subsequent  BiPAP/CPAP/SIPAP Pt Type Adult  BiPAP/CPAP/SIPAP Resmed  Mask Type Full face mask  Dentures removed? Not applicable  Mask Size Medium  Respiratory Rate 18 breaths/min  PEEP 11 cmH20  FiO2 (%) 21 %  Patient Home Machine No  Patient Home Mask No  Patient Home Tubing No  Auto Titrate No  CPAP/SIPAP surface wiped down Yes  Device Plugged into RED Power Outlet Yes  BiPAP/CPAP /SiPAP Vitals  Pulse Rate 84  Resp 18  SpO2 97 %  Bilateral Breath Sounds Clear;Diminished

## 2023-07-23 NOTE — Progress Notes (Signed)
 This chaplain responded to PMT NP-Alicia referral for notarizing the Pt. Advance Directive: HCPOA and Living Will. The Pt. mother and aunt are visiting at the bedside. The Pt. answered the chaplain's clarifying questions after completing AD education.  The chaplain is with the Pt., family, notary and witnesses for the notarizing of the Pt. AD.   The Pt. named [name] as her healthcare agent. If this person is unwilling or unable to serve in this role the Pt. next choice is [name] Gully and Merilee Slade.  The chaplain gave the Pt. the original AD along with three copies. The chaplain scanned the Pt. AD into the Pt. EMR.  This chaplain is available for F/U spiritual care as needed.  Chaplain Leeroy Hummer [phone]

## 2023-07-23 NOTE — Progress Notes (Signed)
 Occupational Therapy Treatment Patient Details Name: [name] [name] MRN: [id number] DOB: 1975-02-25 Today's Date: 07/23/2023   History of present illness 48 yo presented 07/09/23 for transcatheter mitral valve repair with Mitraclip. Post-op severe residual MR and cardiogenic shock. 6/19 impella inserted; extubated 6/20;  plan for LVAD placement 7/3.  PMH-chronic HF (on home milrinone ); CAD, DVT, fibroid tumor, hidradenitis, cardiomyopathy, NSVT, PE, obesity   OT comments  Pt demonstrates sponge bath bathing sitting and standing at chair. Pt tolerates activity in chair well. Pt given additional puzzles to help with sitting in chair as pt has completed all the other puzzles provided. Pt eager to see daughter pending arrival for visit today. Recommendation for Patient will benefit from intensive inpatient follow-up therapy, >3 hours/day with pending LVAD 07/24/23.        If plan is discharge home, recommend the following:  A little help with walking and/or transfers;A little help with bathing/dressing/bathroom   Equipment Recommendations  None recommended by OT    Recommendations for Other Services Rehab consult    Precautions / Restrictions Precautions Precautions: Fall;Other (comment) Recall of Precautions/Restrictions: Impaired Precaution/Restrictions Comments: impella L shoulder access Restrictions Weight Bearing Restrictions Per Provider Order: No       Mobility Bed Mobility                    Transfers Overall transfer level: Needs assistance   Transfers: Sit to/from Stand Sit to Stand: Contact guard assist           General transfer comment: pt able to sit<>Stand with hand on needs to stand     Balance                                           ADL either performed or assessed with clinical judgement   ADL Overall ADL's : Needs assistance/impaired     Grooming: Wash/dry hands;Wash/dry face;Independent   Upper Body Bathing: Modified  independent   Lower Body Bathing: Supervison/ safety   Upper Body Dressing : Modified independent                     General ADL Comments: Pt up in chair on arrival and completed sponge bath. pt able to complete anterior peir care with (A) for posterir.    Extremity/Trunk Assessment Upper Extremity Assessment Upper Extremity Assessment: Overall WFL for tasks assessed   Lower Extremity Assessment Lower Extremity Assessment: Overall WFL for tasks assessed        Vision       Perception     Praxis     Communication     Cognition Arousal: Alert Behavior During Therapy: WFL for tasks assessed/performed Cognition: No apparent impairments                               Following commands: Intact        Cueing      Exercises      Shoulder Instructions       General Comments      Pertinent Vitals/ Pain       Pain Assessment Pain Assessment: No/denies pain  Home Living  Prior Functioning/Environment              Frequency  Min 2X/week        Progress Toward Goals  OT Goals(current goals can now be found in the care plan section)  Progress towards OT goals: Progressing toward goals  Acute Rehab OT Goals Patient Stated Goal: to see my daughter today OT Goal Formulation: With patient Time For Goal Achievement: 07/28/23 Potential to Achieve Goals: Good ADL Goals Pt Will Perform Grooming: with modified independence;standing Pt Will Perform Lower Body Dressing: with supervision;sit to/from stand Pt Will Transfer to Toilet: with modified independence;regular height toilet;ambulating  Plan      Co-evaluation                 AM-PAC OT 6 Clicks Daily Activity     Outcome Measure   Help from another person eating meals?: None Help from another person taking care of personal grooming?: None Help from another person toileting, which includes using toliet,  bedpan, or urinal?: A Little Help from another person bathing (including washing, rinsing, drying)?: A Little Help from another person to put on and taking off regular upper body clothing?: A Little Help from another person to put on and taking off regular lower body clothing?: A Lot 6 Click Score: 19    End of Session    OT Visit Diagnosis: Unsteadiness on feet (R26.81);Muscle weakness (generalized) (M62.81)   Activity Tolerance Patient tolerated treatment well   Patient Left in chair;with call bell/phone within reach   Nurse Communication Mobility status        Time: 8563-8495 OT Time Calculation (min): 28 min  Charges: OT General Charges $OT Visit: 1 Visit OT Treatments $Self Care/Home Management : 23-37 mins   Brynn, OTR/L  Acute Rehabilitation Services Office: [phone] .   Ely Molt 07/23/2023, 3:34 PM

## 2023-07-23 NOTE — Progress Notes (Addendum)
 Patient ID: [name] [name], female   DOB: 1975/02/03, 48 y.o.   MRN: [id number]     Advanced Heart Failure Rounding Note  Cardiologist: Ria Commander, DO  Chief Complaint: Cardiogenic Shock Subjective:    6/17: mTEER with sev posterior leaflet restriction resulting in tear. IABP + PAC. PCWP 40, v up to 60 6/24 : Aggressive diuresed. Started on lasix  drip. Brisk diuresis noted.  6/30: Echo with EF 25-30%, RV moderately dysfunctional/mildly dilated, Mitraclip present with severe MR, mild-moderate TR, dilated IVC.   I/Os net negative 45 cc with Lasix  IV + metolazone  dose. She is on milrinone  0.25. Creatinine stable 1.46.   Just got back from walking 4 laps around the unit. Feels great.   Impella  P9 Flow 5.3 L/min CPO 1.1 LDH 552 => 578 => 582 => 565 Plts 109 => 110 => 124 >120 Hepatin gtt ongoing  Swan numbers: RA 12 PA 86/34 Unable to wedge CO/CI currently not reading Co-ox 55%  Objective:    Weight Range: 110.7 kg Body mass index is 35.02 kg/m.   Vital Signs:   Temp:  [97.9 F (36.6 C)-100 F (37.8 C)] 97.9 F (36.6 C) (07/02 0900) Pulse Rate:  [82-186] 91 (07/02 0900) Resp:  [15-31] 22 (07/02 0900) BP: (84-135)/(56-95) 101/76 (07/02 0900) SpO2:  [82 %-100 %] 98 % (07/02 0900) FiO2 (%):  [21 %] 21 % (07/02 0115) Weight:  [110.7 kg] 110.7 kg (07/02 0500) Last BM Date : 07/19/23  Weight change: Filed Weights   07/17/23 0615 07/22/23 0500 07/23/23 0500  Weight: 106.8 kg 109.6 kg 110.7 kg   Intake/Output:  Intake/Output Summary (Last 24 hours) at 07/23/2023 0953 Last data filed at 07/23/2023 0900 Gross per 24 hour  Intake 2377.58 ml  Output 2125 ml  Net 252.58 ml   Physical Exam   Vitals:   07/23/23 0845 07/23/23 0900  BP:  101/76  Pulse: 83 91  Resp: (!) 29 (!) 22  Temp: 97.9 F (36.6 C) 97.9 F (36.6 C)  SpO2: 96% 98%   General:  well appearing.  No respiratory difficulty Neck: supple. JVD UTA. TSC CVC. Swann RIJ Cor: PMI nondisplaced. Regular  rate & rhythm. No rubs, gallops or murmurs. +Impella 5.5 Lungs: clear Extremities: no cyanosis, clubbing, rash, non-pitting BLE edema  Neuro: alert & oriented x 3. Moves all 4 extremities w/o difficulty. Affect pleasant.   Telemetry  NSR 80s with occasional PVCs (personally reviewed)  Labs  CBC Recent Labs    07/22/23 0500 07/23/23 0417 07/23/23 0627  WBC 7.8 9.3  --   HGB 9.3* 9.2* 10.2*  HCT 30.4* 30.5* 30.0*  MCV 88.1 89.4  --   PLT 124* 120*  --    Basic Metabolic Panel Recent Labs    92/98/74 0500 07/23/23 0417 07/23/23 0627  NA 132* 131* 131*  K 4.3 4.5 4.6  CL 91* 94*  --   CO2 30 28  --   GLUCOSE 94 107*  --   BUN 24* 25*  --   CREATININE 1.43* 1.46*  --   CALCIUM  9.3 9.4  --   MG 2.0 1.8  --    Liver Function Tests Recent Labs    07/23/23 0432  AST 33  ALT 14  ALKPHOS 74  BILITOT 1.2  PROT 7.6  ALBUMIN  2.7*   BNP (last 3 results) Recent Labs    05/28/23 1224 06/30/23 1218 07/04/23 1057  BNP 1,155.3* 600.6* 612.0*   Fasting Lipid Panel No results for input(s): CHOL,  HDL, LDLCALC, TRIG, CHOLHDL, LDLDIRECT in the last 72 hours.  Medications:   Scheduled Medications:  acetaminophen   500 mg Oral Q6H   amiodarone   400 mg Oral BID   bisacodyl   5 mg Oral Once   [START ON 07/24/2023] chlorhexidine   15 mL Mouth/Throat Once   Chlorhexidine  Gluconate Cloth  6 each Topical Daily   Chlorhexidine  Gluconate Cloth  6 each Topical Once   And   Chlorhexidine  Gluconate Cloth  6 each Topical Once   [START ON 07/24/2023] diazepam   5 mg Oral Once   feeding supplement  237 mL Oral TID BM   fluticasone   2 spray Each Nare Daily   furosemide   80 mg Intravenous BID   hydrALAZINE   10 mg Oral Q8H   insulin  aspart  0-9 Units Subcutaneous Q4H   loratadine   10 mg Oral Daily   multivitamin with minerals  1 tablet Oral Daily   mupirocin  ointment  1 Application Nasal BID   pantoprazole   40 mg Oral Daily   polyethylene glycol  17 g Oral BID   senna-docusate   2 tablet Oral BID   spironolactone   25 mg Oral Daily   Infusions:  heparin  2,300 Units/hr (07/23/23 0900)   milrinone  0.25 mcg/kg/min (07/23/23 0900)   rifampin (RIFADIN) 600 mg in sodium chloride  0.9 % 100 mL IVPB     sodium bicarbonate  25 mEq (Impella PURGE) in dextrose  5 % 1000 mL bag     PRN Medications: sodium chloride , acetaminophen , albuterol , ALPRAZolam , morphine  injection, ondansetron  (ZOFRAN ) IV, mouth rinse, oxyCODONE , oxyCODONE , sodium chloride , temazepam  Patient Profile   48 y.o. female with history of chronic systolic HF with biventricular dysfunction on home inotrope, severe MR, hx PE/DVT, obesity, CAD, anemia, tobacco use. Presented for mTEER on 07/09/23. Worsening MR leading to cardiogenic shock.  Assessment/Plan  Cardiogenic shock: due to torrential MR s/p mTEER complicated by severely restricted posterior leaflet that was damaged during repair attempt, IABP and nipride  needed to stabilize. Transitioned to Impella 5.5. Consideration for LVAD workup, but she has significant social concerns. Not a transplant candidate with tobacco use.  I do not believe that she would be able to survive without LVAD support, the alternative is comfort care.  - Impella 5.5 at P9 flowing 5.1 LPM. Plts and LDH stable.  Will get limited echo today to assess position again, was close to mitral valve after adjustment yesterday, want to reassess => Echo showed stable Impella placement, near MV but moving freely.  I decided not to move it.  LV systolic function did look improved from prior in 30-35% range, there is thinning/akinesis of the inferolateral wall, mild-moderate RV dysfunction, severe MR, dilated IVC.   - Reviewing prior echoes, the inferolateral wall has been akinetic long-term.  Cath in 5/23 showed mild nonobstructive CAD. Patient has never had cardiac MRI but cannot get one at this point with Impella.  - I/Os net negative -45mL with Lasix  80 mg IV bid + metolazone . Will give another 80 IV  BID and 5mg  metolazone   PA pressures remain high but lower today than yesterday, CVP down to 12-13.  Will give Lasix  120 mg IV bid and metolazone  2.5 x 1 again today, replace K.  - Adequate CI by thermo and co-ox.  Continue milrinone  0.25 mcg/kg/min.  - Remove swan today - Can continue hydralazine  10mg  TID for afterload reduction.  -  Dr. Rolan discussed LVAD + MV replacement with Dr. Lucas, needs 1 tooth out but concerned that waiting until next week to  do this is not worth the risk.  Discussed this morning in [facility] and have decided to proceed with LVAD implant tomorrow. . RV function appears adequate currently.  With torrential MR and torn leaflet, she will need MV replacement as well.  She may need Protek Duo immediately post-op depending on how RV looks.  - PFTs with severely reduced volumes. I feel this is out of proportion to her clinically. I suspect this may be due to poor effort and weakness. In addition, CT chest does not demonstrating significant parenchymal lung disease.    Acute on chronic systolic HF with biventricular dysfunction - stage D cardiomyopathy - nonobstructive CAD on cath 2023.  - RHC 5/25 on milrinone  0.25: severe pulmonary venous HTN, severely elevated PCWP with prominent v waves, preserved CO - I think she can come off milrinone  from a RV standpoint; however, I worry about her elevated PA pressures. Will continue 0.25mcg/kg/min; mixed venous 55%.    Torrential MR - See above - Improved with unloading, extubated 6/20 - Will need mitral valve replacement with LVAD.    Acute hypoxic respiratory failure - In setting of acute on chronic CHF and severe/torrential  - Improved.   Hx of PE/DVT w/ IVC filter - Evaluated at [facility] where thrombophilia work-up was negative (Lupus inhibitor, anticardiolipin antibodies, and anti beta-2  glycoprotein antibodies were negative; protein C activity 85%, protein S activity 76%, factor V Leiden, and prothrombin gene mutation ) - IVC filter  in place; could not be removed due to significant thrombus burden on filter at [facility]  - Heparin  gtt for now with Impella and potential procedures   CAD - LHC in 5/23 w/ nonobstructive CAD involving RCA - No aspirin  with need for anticoagulation.  Tobacco use - Smoking 3 cigarettes/day at time of last follow-up.  Anemia  - Hgb stable - Transfuse for HGB < 8. No obvious source.   Headache, Severe - CT yesterday (-) - No HA this morning.   PVCs - Continue PO amio.   AKI - Creatinine 1.46 today with diuresis, follow closely.   Discussed at [facility] this morning. Plan for LVAD tomorrow.   Length of Stay: 14  Beckey LITTIE Coe, NP  07/23/2023, 9:53 AM  Advanced Heart Failure Team Pager [phone] (M-F; 7a - 5p)  Please contact [facility] for night-coverage after hours (5p -7a ) and weekends on [email or web address]  CRITICAL CARE Performed by: Beckey LITTIE Coe   Total critical care time: 15 minutes  Critical care time was exclusive of separately billable procedures and treating other patients.  Critical care was necessary to treat or prevent imminent or life-threatening deterioration.  Critical care was time spent personally by me on the following activities: development of treatment plan with patient and/or surrogate as well as nursing, discussions with consultants, evaluation of patient's response to treatment, examination of patient, obtaining history from patient or surrogate, ordering and performing treatments and interventions, ordering and review of laboratory studies, ordering and review of radiographic studies, pulse oximetry and re-evaluation of patient's condition.   Patient seen with NP, agree with the above note.   Creatinine stable today at 1.46.  Diuresis not as vigorous today, I/Os even.  PA pressure remains severely elevated, CVP 12-13. CI 2.2, co-ox 55% on milrinone  0.25. Flow stable 5.1 L/min Impella P9.   Walked 4 laps today, denies dyspnea.   General: NAD Neck: JVP 10-12, no  thyromegaly or thyroid  nodule.  Lungs: Clear to auscultation bilaterally with normal respiratory effort. CV: Nondisplaced PMI.  Heart regular S1/S2, no S3/S4, 3/6 HSM apex.  1+ ankle edema.   Abdomen: Soft, nontender, no hepatosplenomegaly, no distention.  Skin: Intact without lesions or rashes.  Neurologic: Alert and oriented x 3.  Psych: Normal affect. Extremities: No clubbing or cyanosis.  HEENT: Normal.   Further diuresis today, will give Lasix  120 mg IV bid + metolazone  5 x 1.  Continue milrinone  0.25.    Plan for LVAD + MV replacement tomorrow.  Conditional placement of Protek Duo for RV support post-op if needed.  RV on echo yesterday was mild-moderately  dysfunctional, patient generating very high PA pressure.  Will remove RIJ and Swan today prior to surgery.  Has tunneled catheter right subclavian.   Heparin  gtt for Impella.   CRITICAL CARE Performed by: Ezra Shuck  Total critical care time: 40 minutes  Critical care time was exclusive of separately billable procedures and treating other patients.  Critical care was necessary to treat or prevent imminent or life-threatening deterioration.  Critical care was time spent personally by me on the following activities: development of treatment plan with patient and/or surrogate as well as nursing, discussions with consultants, evaluation of patient's response to treatment, examination of patient, obtaining history from patient or surrogate, ordering and performing treatments and interventions, ordering and review of laboratory studies, ordering and review of radiographic studies, pulse oximetry and re-evaluation of patient's condition.  Ezra Shuck 07/23/2023 10:34 AM

## 2023-07-23 NOTE — Progress Notes (Signed)
 PHARMACY - ANTICOAGULATION CONSULT NOTE  Pharmacy Consult for heparin  Indication: IABP>impella 5.5 + LLE DVT  Allergies  Allergen Reactions   Sulfa  Antibiotics Itching, Swelling and Rash    Lip swelling    Patient Measurements: Height: 5' 10 (177.8 cm) Weight: 110.7 kg (244 lb 0.8 oz) IBW/kg (Calculated) : 68.5 HEPARIN  DW (KG): 93  Vital Signs: Temp: 98.2 F (36.8 C) (07/02 0800) BP: 86/67 (07/02 0800) Pulse Rate: 86 (07/02 0800)  Labs: Recent Labs    07/21/23 0521 07/22/23 0500 07/22/23 1420 07/23/23 0417 07/23/23 0627  HGB 9.3* 9.3*  --  9.2* 10.2*  HCT 30.2* 30.4*  --  30.5* 30.0*  PLT 110* 124*  --  120*  --   HEPARINUNFRC 0.26* 0.29* 0.21* 0.24*  --   CREATININE 1.17* 1.43*  --  1.46*  --     Estimated Creatinine Clearance: 64.9 mL/min (A) (by C-G formula based on SCr of 1.46 mg/dL (H)).  Assessment: 12 yof who presented for mTEER complicated with torn mitral valve leaflet resulting in torrential MR and cardiogenic shock requiring IABP. On warfarin PTA for hx PE (LD 6/12) - was on enoxaparin  while warfarin on hold for procedure (LD 6/17).  Heparin  infusion for IABP restarted 2 hours after procedure 6/18. Impella placed 6/19.   On 6/27, patient found to have chronic DVT in the left femoral vein and left popliteal vein.   Heparin  level 0.24 close to therapeutic on heparin  2300 units/hr.  Heparin  running into central line - pause to drawn labs also from central line - expect some fluctuations - no PIV access.  Attempted peripheral stick this AM but phlebotomy couldn't get access.  No issues with infusion running or signs of bleeding per RN.  Hgb 9s, PLT 120s - both stable. LDH elevated but stable 500s.  Impella remains at P9.   Goal of Therapy:  Heparin  level 0.3-0.5 units/ml Monitor platelets by anticoagulation protocol: Yes   Plan:  Continue heparin  infusion at 2300 units/hr Daily heparin  level and CBC (monitor platelets and LDH) Monitor for signs/symptoms  of bleeding  Maurilio Fila, PharmD Clinical Pharmacist 07/23/2023  8:19 AM

## 2023-07-23 NOTE — Progress Notes (Signed)
 Physical Therapy Treatment Patient Details Name: [name] [name] MRN: [id number] DOB: [birth year] Today's Date: 07/23/2023   History of Present Illness 48 yo presented 07/09/23 for transcatheter mitral valve repair with Mitraclip. Post-op severe residual MR and cardiogenic shock. 6/19 impella inserted; extubated 6/20;  plan for LVAD placement 7/3.  PMH-chronic HF (on home milrinone ); CAD, DVT, fibroid tumor, hidradenitis, cardiomyopathy, NSVT, PE, obesity    PT Comments  Pt admitted with above diagnosis. Pt was able to ambulate and incr distance considerably with good safety with use of RW. Pt practiced sit to stands without UE support for preparation for LVAD.  Fatigued but tolerated well.  Pt currently with functional limitations due to the deficits listed below (see PT Problem List). Pt will benefit from acute skilled PT to increase their independence and safety with mobility to allow discharge.       If plan is discharge home, recommend the following: Assistance with cooking/housework;Assist for transportation;Help with stairs or ramp for entrance;A little help with walking and/or transfers;A little help with bathing/dressing/bathroom   Can travel by private Psychologist, clinical (4 wheels);BSC/3in1    Recommendations for Other Services       Precautions / Restrictions Precautions Precautions: Fall;Other (comment) Recall of Precautions/Restrictions: Impaired Precaution/Restrictions Comments: impella L shoulder access Restrictions Weight Bearing Restrictions Per Provider Order: No     Mobility  Bed Mobility               General bed mobility comments: in chair on arrival    Transfers Overall transfer level: Needs assistance Equipment used: Rolling walker (2 wheels) Transfers: Sit to/from Stand Sit to Stand: +2 safety/equipment, Supervision   Step pivot transfers: Supervision       General transfer comment: Supervision for safety with +2  assist to manage lines/leads. Cues for hand placement, RW to steady upon rising. After pts walk, practiced 10 sit to stands, 5 with pillow and 5 using hands on knees and pt able to complete all with supervision only.    Ambulation/Gait Ambulation/Gait assistance: +2 safety/equipment, Supervision (( RN)) Gait Distance (Feet): 1200 Feet Assistive device: Rolling walker (2 wheels) Gait Pattern/deviations: Step-through pattern, Decreased stride length, Trunk flexed Gait velocity: dec Gait velocity interpretation: 1.31 - 2.62 ft/sec, indicative of limited community ambulator   General Gait Details: Cues for upright posture and reduced support from RW. Progressed to supervision for safety with +2 assist for line/lead management. VSS throughout on RA. No overt LOB or buckling.   Stairs             Wheelchair Mobility     Tilt Bed    Modified Rankin (Stroke Patients Only)       Balance Overall balance assessment: Needs assistance Sitting-balance support: Feet supported, No upper extremity supported Sitting balance-Leahy Scale: Good     Standing balance support: During functional activity, Bilateral upper extremity supported Standing balance-Leahy Scale: Poor Standing balance comment: RW for support.                            Communication Communication Communication: No apparent difficulties  Cognition Arousal: Alert Behavior During Therapy: WFL for tasks assessed/performed   PT - Cognitive impairments: No apparent impairments                         Following commands: Intact      Cueing Cueing Techniques: Verbal  cues  Exercises General Exercises - Lower Extremity Ankle Circles/Pumps: AROM, Both, 15 reps Long Arc Quad: Both, 10 reps, Strengthening, Seated    General Comments        Pertinent Vitals/Pain Pain Assessment Pain Assessment: No/denies pain    Home Living                          Prior Function             PT Goals (current goals can now be found in the care plan section) Acute Rehab PT Goals Patient Stated Goal: to get better Progress towards PT goals: Progressing toward goals    Frequency    Min 2X/week      PT Plan      Co-evaluation              AM-PAC PT 6 Clicks Mobility   Outcome Measure  Help needed turning from your back to your side while in a flat bed without using bedrails?: A Little Help needed moving from lying on your back to sitting on the side of a flat bed without using bedrails?: A Little Help needed moving to and from a bed to a chair (including a wheelchair)?: A Little Help needed standing up from a chair using your arms (e.g., wheelchair or bedside chair)?: A Little Help needed to walk in hospital room?: A Little Help needed climbing 3-5 steps with a railing? : A Lot 6 Click Score: 17    End of Session Equipment Utilized During Treatment: Gait belt Activity Tolerance: Patient tolerated treatment well Patient left: with call bell/phone within reach;in chair Nurse Communication: Mobility status PT Visit Diagnosis: Unsteadiness on feet (R26.81);Muscle weakness (generalized) (M62.81);Difficulty in walking, not elsewhere classified (R26.2)     Time: 0940-1007 PT Time Calculation (min) (ACUTE ONLY): 27 min  Charges:    $Gait Training: 8-22 mins $Therapeutic Exercise: 8-22 mins PT General Charges $$ ACUTE PT VISIT: 1 Visit                     [name] [name] M,PT Acute Rehab Services [phone]    [name] [name] 07/23/2023, 11:46 AM

## 2023-07-24 ENCOUNTER — Inpatient Hospital Stay (HOSPITAL_COMMUNITY): Payer: Self-pay | Admitting: Anesthesiology

## 2023-07-24 ENCOUNTER — Inpatient Hospital Stay (HOSPITAL_COMMUNITY)

## 2023-07-24 ENCOUNTER — Encounter (HOSPITAL_COMMUNITY): Payer: Self-pay | Admitting: Cardiology

## 2023-07-24 ENCOUNTER — Inpatient Hospital Stay (HOSPITAL_COMMUNITY)
Admission: RE | Disposition: A | Discharge status: Home Health | Payer: Self-pay | Source: Ambulatory Visit | Attending: Surgery

## 2023-07-24 ENCOUNTER — Other Ambulatory Visit: Payer: Self-pay

## 2023-07-24 DIAGNOSIS — I340 Nonrheumatic mitral (valve) insufficiency: Secondary | ICD-10-CM | POA: Diagnosis not present

## 2023-07-24 DIAGNOSIS — I5023 Acute on chronic systolic (congestive) heart failure: Secondary | ICD-10-CM

## 2023-07-24 DIAGNOSIS — Z95811 Presence of heart assist device: Principal | ICD-10-CM

## 2023-07-24 DIAGNOSIS — F17210 Nicotine dependence, cigarettes, uncomplicated: Secondary | ICD-10-CM

## 2023-07-24 DIAGNOSIS — I5043 Acute on chronic combined systolic (congestive) and diastolic (congestive) heart failure: Secondary | ICD-10-CM | POA: Diagnosis not present

## 2023-07-24 DIAGNOSIS — I252 Old myocardial infarction: Secondary | ICD-10-CM | POA: Diagnosis not present

## 2023-07-24 DIAGNOSIS — I509 Heart failure, unspecified: Secondary | ICD-10-CM

## 2023-07-24 DIAGNOSIS — I110 Hypertensive heart disease with heart failure: Secondary | ICD-10-CM | POA: Diagnosis not present

## 2023-07-24 DIAGNOSIS — I5020 Unspecified systolic (congestive) heart failure: Secondary | ICD-10-CM

## 2023-07-24 DIAGNOSIS — I5084 End stage heart failure: Secondary | ICD-10-CM | POA: Diagnosis not present

## 2023-07-24 DIAGNOSIS — J9601 Acute respiratory failure with hypoxia: Secondary | ICD-10-CM | POA: Diagnosis not present

## 2023-07-24 DIAGNOSIS — R570 Cardiogenic shock: Secondary | ICD-10-CM

## 2023-07-24 HISTORY — PX: REMOVAL OF IMPELLA LEFT VENTRICULAR ASSIST DEVICE: SHX6556

## 2023-07-24 HISTORY — PX: MITRAL VALVE REPLACEMENT: SHX147

## 2023-07-24 HISTORY — PX: TEE WITHOUT CARDIOVERSION: SHX5443

## 2023-07-24 HISTORY — PX: INSERTION OF IMPLANTABLE LEFT VENTRICULAR ASSIST DEVICE: SHX5866

## 2023-07-24 LAB — PROTIME-INR
INR: 1.6 — ABNORMAL HIGH (ref 0.8–1.2)
Prothrombin Time: 20.2 s — ABNORMAL HIGH (ref 11.4–15.2)

## 2023-07-24 LAB — POCT I-STAT 7, (LYTES, BLD GAS, ICA,H+H)
Acid-Base Excess: 5 mmol/L — ABNORMAL HIGH (ref 0.0–2.0)
Acid-Base Excess: 2 mmol/L (ref 0.0–2.0)
Acid-Base Excess: 5 mmol/L — ABNORMAL HIGH (ref 0.0–2.0)
Acid-Base Excess: 2 mmol/L (ref 0.0–2.0)
Acid-Base Excess: 1 mmol/L (ref 0.0–2.0)
Acid-Base Excess: 1 mmol/L (ref 0.0–2.0)
Acid-Base Excess: 2 mmol/L (ref 0.0–2.0)
Acid-Base Excess: 3 mmol/L — ABNORMAL HIGH (ref 0.0–2.0)
Acid-base deficit: 2 mmol/L (ref 0.0–2.0)
Bicarbonate: 30.8 mmol/L — ABNORMAL HIGH (ref 20.0–28.0)
Bicarbonate: 27 mmol/L (ref 20.0–28.0)
Bicarbonate: 29.7 mmol/L — ABNORMAL HIGH (ref 20.0–28.0)
Bicarbonate: 26.6 mmol/L (ref 20.0–28.0)
Bicarbonate: 24.6 mmol/L (ref 20.0–28.0)
Bicarbonate: 28.3 mmol/L — ABNORMAL HIGH (ref 20.0–28.0)
Bicarbonate: 28.5 mmol/L — ABNORMAL HIGH (ref 20.0–28.0)
Bicarbonate: 27.9 mmol/L (ref 20.0–28.0)
Bicarbonate: 27 mmol/L (ref 20.0–28.0)
Calcium, Ion: 1.24 mmol/L (ref 1.15–1.40)
Calcium, Ion: 1.08 mmol/L — ABNORMAL LOW (ref 1.15–1.40)
Calcium, Ion: 1.14 mmol/L — ABNORMAL LOW (ref 1.15–1.40)
Calcium, Ion: 1.14 mmol/L — ABNORMAL LOW (ref 1.15–1.40)
Calcium, Ion: 1.21 mmol/L (ref 1.15–1.40)
Calcium, Ion: 1.18 mmol/L (ref 1.15–1.40)
Calcium, Ion: 1.25 mmol/L (ref 1.15–1.40)
Calcium, Ion: 1.16 mmol/L (ref 1.15–1.40)
Calcium, Ion: 1.19 mmol/L (ref 1.15–1.40)
HCT: 31 % — ABNORMAL LOW (ref 36.0–46.0)
HCT: 25 % — ABNORMAL LOW (ref 36.0–46.0)
HCT: 26 % — ABNORMAL LOW (ref 36.0–46.0)
HCT: 28 % — ABNORMAL LOW (ref 36.0–46.0)
HCT: 30 % — ABNORMAL LOW (ref 36.0–46.0)
HCT: 23 % — ABNORMAL LOW (ref 36.0–46.0)
HCT: 26 % — ABNORMAL LOW (ref 36.0–46.0)
HCT: 21 % — ABNORMAL LOW (ref 36.0–46.0)
HCT: 24 % — ABNORMAL LOW (ref 36.0–46.0)
Hemoglobin: 10.5 g/dL — ABNORMAL LOW (ref 12.0–15.0)
Hemoglobin: 8.5 g/dL — ABNORMAL LOW (ref 12.0–15.0)
Hemoglobin: 8.8 g/dL — ABNORMAL LOW (ref 12.0–15.0)
Hemoglobin: 9.5 g/dL — ABNORMAL LOW (ref 12.0–15.0)
Hemoglobin: 10.2 g/dL — ABNORMAL LOW (ref 12.0–15.0)
Hemoglobin: 7.8 g/dL — ABNORMAL LOW (ref 12.0–15.0)
Hemoglobin: 8.8 g/dL — ABNORMAL LOW (ref 12.0–15.0)
Hemoglobin: 7.1 g/dL — ABNORMAL LOW (ref 12.0–15.0)
Hemoglobin: 8.2 g/dL — ABNORMAL LOW (ref 12.0–15.0)
O2 Saturation: 100 %
O2 Saturation: 100 %
O2 Saturation: 100 %
O2 Saturation: 100 %
O2 Saturation: 100 %
O2 Saturation: 100 %
O2 Saturation: 100 %
O2 Saturation: 98 %
O2 Saturation: 99 %
Patient temperature: 35.6
Patient temperature: 35.7
Patient temperature: 36.4
Patient temperature: 36.7
Potassium: 4.3 mmol/L (ref 3.5–5.1)
Potassium: 3.9 mmol/L (ref 3.5–5.1)
Potassium: 3.5 mmol/L (ref 3.5–5.1)
Potassium: 3.6 mmol/L (ref 3.5–5.1)
Potassium: 3.5 mmol/L (ref 3.5–5.1)
Potassium: 3.3 mmol/L — ABNORMAL LOW (ref 3.5–5.1)
Potassium: 3.4 mmol/L — ABNORMAL LOW (ref 3.5–5.1)
Potassium: 4.4 mmol/L (ref 3.5–5.1)
Potassium: 4.2 mmol/L (ref 3.5–5.1)
Sodium: 131 mmol/L — ABNORMAL LOW (ref 135–145)
Sodium: 131 mmol/L — ABNORMAL LOW (ref 135–145)
Sodium: 133 mmol/L — ABNORMAL LOW (ref 135–145)
Sodium: 133 mmol/L — ABNORMAL LOW (ref 135–145)
Sodium: 136 mmol/L (ref 135–145)
Sodium: 135 mmol/L (ref 135–145)
Sodium: 137 mmol/L (ref 135–145)
Sodium: 134 mmol/L — ABNORMAL LOW (ref 135–145)
Sodium: 134 mmol/L — ABNORMAL LOW (ref 135–145)
TCO2: 32 mmol/L (ref 22–32)
TCO2: 28 mmol/L (ref 22–32)
TCO2: 31 mmol/L (ref 22–32)
TCO2: 28 mmol/L (ref 22–32)
TCO2: 26 mmol/L (ref 22–32)
TCO2: 30 mmol/L (ref 22–32)
TCO2: 30 mmol/L (ref 22–32)
TCO2: 29 mmol/L (ref 22–32)
TCO2: 28 mmol/L (ref 22–32)
pCO2 arterial: 48 mmHg (ref 32–48)
pCO2 arterial: 42.6 mmHg (ref 32–48)
pCO2 arterial: 46 mmHg (ref 32–48)
pCO2 arterial: 41.2 mmHg (ref 32–48)
pCO2 arterial: 47.3 mmHg (ref 32–48)
pCO2 arterial: 54.7 mmHg — ABNORMAL HIGH (ref 32–48)
pCO2 arterial: 56.5 mmHg — ABNORMAL HIGH (ref 32–48)
pCO2 arterial: 46.8 mmHg (ref 32–48)
pCO2 arterial: 38.2 mmHg (ref 32–48)
pH, Arterial: 7.415 (ref 7.35–7.45)
pH, Arterial: 7.41 (ref 7.35–7.45)
pH, Arterial: 7.418 (ref 7.35–7.45)
pH, Arterial: 7.418 (ref 7.35–7.45)
pH, Arterial: 7.325 — ABNORMAL LOW (ref 7.35–7.45)
pH, Arterial: 7.303 — ABNORMAL LOW (ref 7.35–7.45)
pH, Arterial: 7.315 — ABNORMAL LOW (ref 7.35–7.45)
pH, Arterial: 7.382 (ref 7.35–7.45)
pH, Arterial: 7.457 — ABNORMAL HIGH (ref 7.35–7.45)
pO2, Arterial: 170 mmHg — ABNORMAL HIGH (ref 83–108)
pO2, Arterial: 295 mmHg — ABNORMAL HIGH (ref 83–108)
pO2, Arterial: 391 mmHg — ABNORMAL HIGH (ref 83–108)
pO2, Arterial: 335 mmHg — ABNORMAL HIGH (ref 83–108)
pO2, Arterial: 229 mmHg — ABNORMAL HIGH (ref 83–108)
pO2, Arterial: 247 mmHg — ABNORMAL HIGH (ref 83–108)
pO2, Arterial: 250 mmHg — ABNORMAL HIGH (ref 83–108)
pO2, Arterial: 111 mmHg — ABNORMAL HIGH (ref 83–108)
pO2, Arterial: 128 mmHg — ABNORMAL HIGH (ref 83–108)

## 2023-07-24 LAB — CBC
HCT: 28.4 % — ABNORMAL LOW (ref 36.0–46.0)
HCT: 23.6 % — ABNORMAL LOW (ref 36.0–46.0)
HCT: 24.8 % — ABNORMAL LOW (ref 36.0–46.0)
Hemoglobin: 8.6 g/dL — ABNORMAL LOW (ref 12.0–15.0)
Hemoglobin: 7.5 g/dL — ABNORMAL LOW (ref 12.0–15.0)
Hemoglobin: 8 g/dL — ABNORMAL LOW (ref 12.0–15.0)
MCH: 27 pg (ref 26.0–34.0)
MCH: 28.5 pg (ref 26.0–34.0)
MCH: 28.4 pg (ref 26.0–34.0)
MCHC: 30.3 g/dL (ref 30.0–36.0)
MCHC: 31.8 g/dL (ref 30.0–36.0)
MCHC: 32.3 g/dL (ref 30.0–36.0)
MCV: 89 fL (ref 80.0–100.0)
MCV: 89.7 fL (ref 80.0–100.0)
MCV: 87.9 fL (ref 80.0–100.0)
Platelets: 120 10*3/uL — ABNORMAL LOW (ref 150–400)
Platelets: 85 10*3/uL — ABNORMAL LOW (ref 150–400)
Platelets: 96 10*3/uL — ABNORMAL LOW (ref 150–400)
RBC: 3.19 MIL/uL — ABNORMAL LOW (ref 3.87–5.11)
RBC: 2.63 MIL/uL — ABNORMAL LOW (ref 3.87–5.11)
RBC: 2.82 MIL/uL — ABNORMAL LOW (ref 3.87–5.11)
RDW: 21.6 % — ABNORMAL HIGH (ref 11.5–15.5)
RDW: 20.2 % — ABNORMAL HIGH (ref 11.5–15.5)
RDW: 19 % — ABNORMAL HIGH (ref 11.5–15.5)
WBC: 7.1 10*3/uL (ref 4.0–10.5)
WBC: 27.7 10*3/uL — ABNORMAL HIGH (ref 4.0–10.5)
WBC: 27.8 10*3/uL — ABNORMAL HIGH (ref 4.0–10.5)
nRBC: 0 % (ref 0.0–0.2)
nRBC: 0 % (ref 0.0–0.2)
nRBC: 0 % (ref 0.0–0.2)

## 2023-07-24 LAB — POCT I-STAT, CHEM 8
BUN: 28 mg/dL — ABNORMAL HIGH (ref 6–20)
BUN: 29 mg/dL — ABNORMAL HIGH (ref 6–20)
BUN: 26 mg/dL — ABNORMAL HIGH (ref 6–20)
BUN: 29 mg/dL — ABNORMAL HIGH (ref 6–20)
BUN: 30 mg/dL — ABNORMAL HIGH (ref 6–20)
BUN: 29 mg/dL — ABNORMAL HIGH (ref 6–20)
BUN: 30 mg/dL — ABNORMAL HIGH (ref 6–20)
BUN: 29 mg/dL — ABNORMAL HIGH (ref 6–20)
Calcium, Ion: 1.25 mmol/L (ref 1.15–1.40)
Calcium, Ion: 1.2 mmol/L (ref 1.15–1.40)
Calcium, Ion: 1.05 mmol/L — ABNORMAL LOW (ref 1.15–1.40)
Calcium, Ion: 1.14 mmol/L — ABNORMAL LOW (ref 1.15–1.40)
Calcium, Ion: 1.15 mmol/L (ref 1.15–1.40)
Calcium, Ion: 1.15 mmol/L (ref 1.15–1.40)
Calcium, Ion: 1.16 mmol/L (ref 1.15–1.40)
Calcium, Ion: 1.24 mmol/L (ref 1.15–1.40)
Chloride: 92 mmol/L — ABNORMAL LOW (ref 98–111)
Chloride: 93 mmol/L — ABNORMAL LOW (ref 98–111)
Chloride: 91 mmol/L — ABNORMAL LOW (ref 98–111)
Chloride: 91 mmol/L — ABNORMAL LOW (ref 98–111)
Chloride: 93 mmol/L — ABNORMAL LOW (ref 98–111)
Chloride: 92 mmol/L — ABNORMAL LOW (ref 98–111)
Chloride: 94 mmol/L — ABNORMAL LOW (ref 98–111)
Chloride: 95 mmol/L — ABNORMAL LOW (ref 98–111)
Creatinine, Ser: 1.6 mg/dL — ABNORMAL HIGH (ref 0.44–1.00)
Creatinine, Ser: 1.6 mg/dL — ABNORMAL HIGH (ref 0.44–1.00)
Creatinine, Ser: 1.4 mg/dL — ABNORMAL HIGH (ref 0.44–1.00)
Creatinine, Ser: 1.5 mg/dL — ABNORMAL HIGH (ref 0.44–1.00)
Creatinine, Ser: 1.5 mg/dL — ABNORMAL HIGH (ref 0.44–1.00)
Creatinine, Ser: 1.5 mg/dL — ABNORMAL HIGH (ref 0.44–1.00)
Creatinine, Ser: 1.5 mg/dL — ABNORMAL HIGH (ref 0.44–1.00)
Creatinine, Ser: 1.4 mg/dL — ABNORMAL HIGH (ref 0.44–1.00)
Glucose, Bld: 114 mg/dL — ABNORMAL HIGH (ref 70–99)
Glucose, Bld: 145 mg/dL — ABNORMAL HIGH (ref 70–99)
Glucose, Bld: 124 mg/dL — ABNORMAL HIGH (ref 70–99)
Glucose, Bld: 141 mg/dL — ABNORMAL HIGH (ref 70–99)
Glucose, Bld: 163 mg/dL — ABNORMAL HIGH (ref 70–99)
Glucose, Bld: 165 mg/dL — ABNORMAL HIGH (ref 70–99)
Glucose, Bld: 171 mg/dL — ABNORMAL HIGH (ref 70–99)
Glucose, Bld: 104 mg/dL — ABNORMAL HIGH (ref 70–99)
HCT: 32 % — ABNORMAL LOW (ref 36.0–46.0)
HCT: 30 % — ABNORMAL LOW (ref 36.0–46.0)
HCT: 25 % — ABNORMAL LOW (ref 36.0–46.0)
HCT: 30 % — ABNORMAL LOW (ref 36.0–46.0)
HCT: 23 % — ABNORMAL LOW (ref 36.0–46.0)
HCT: 22 % — ABNORMAL LOW (ref 36.0–46.0)
HCT: 24 % — ABNORMAL LOW (ref 36.0–46.0)
HCT: 27 % — ABNORMAL LOW (ref 36.0–46.0)
Hemoglobin: 10.9 g/dL — ABNORMAL LOW (ref 12.0–15.0)
Hemoglobin: 10.2 g/dL — ABNORMAL LOW (ref 12.0–15.0)
Hemoglobin: 10.2 g/dL — ABNORMAL LOW (ref 12.0–15.0)
Hemoglobin: 8.5 g/dL — ABNORMAL LOW (ref 12.0–15.0)
Hemoglobin: 7.8 g/dL — ABNORMAL LOW (ref 12.0–15.0)
Hemoglobin: 7.5 g/dL — ABNORMAL LOW (ref 12.0–15.0)
Hemoglobin: 8.2 g/dL — ABNORMAL LOW (ref 12.0–15.0)
Hemoglobin: 9.2 g/dL — ABNORMAL LOW (ref 12.0–15.0)
Potassium: 4.3 mmol/L (ref 3.5–5.1)
Potassium: 4.1 mmol/L (ref 3.5–5.1)
Potassium: 3.9 mmol/L (ref 3.5–5.1)
Potassium: 3.9 mmol/L (ref 3.5–5.1)
Potassium: 3.9 mmol/L (ref 3.5–5.1)
Potassium: 3.3 mmol/L — ABNORMAL LOW (ref 3.5–5.1)
Potassium: 4.1 mmol/L (ref 3.5–5.1)
Potassium: 3.5 mmol/L (ref 3.5–5.1)
Sodium: 132 mmol/L — ABNORMAL LOW (ref 135–145)
Sodium: 130 mmol/L — ABNORMAL LOW (ref 135–145)
Sodium: 131 mmol/L — ABNORMAL LOW (ref 135–145)
Sodium: 132 mmol/L — ABNORMAL LOW (ref 135–145)
Sodium: 131 mmol/L — ABNORMAL LOW (ref 135–145)
Sodium: 133 mmol/L — ABNORMAL LOW (ref 135–145)
Sodium: 133 mmol/L — ABNORMAL LOW (ref 135–145)
Sodium: 135 mmol/L (ref 135–145)
TCO2: 29 mmol/L (ref 22–32)
TCO2: 29 mmol/L (ref 22–32)
TCO2: 26 mmol/L (ref 22–32)
TCO2: 28 mmol/L (ref 22–32)
TCO2: 33 mmol/L — ABNORMAL HIGH (ref 22–32)
TCO2: 28 mmol/L (ref 22–32)
TCO2: 29 mmol/L (ref 22–32)
TCO2: 26 mmol/L (ref 22–32)

## 2023-07-24 LAB — GLUCOSE, CAPILLARY
Glucose-Capillary: 107 mg/dL — ABNORMAL HIGH (ref 70–99)
Glucose-Capillary: 125 mg/dL — ABNORMAL HIGH (ref 70–99)
Glucose-Capillary: 61 mg/dL — ABNORMAL LOW (ref 70–99)
Glucose-Capillary: 69 mg/dL — ABNORMAL LOW (ref 70–99)
Glucose-Capillary: 93 mg/dL (ref 70–99)
Glucose-Capillary: 108 mg/dL — ABNORMAL HIGH (ref 70–99)
Glucose-Capillary: 123 mg/dL — ABNORMAL HIGH (ref 70–99)
Glucose-Capillary: 146 mg/dL — ABNORMAL HIGH (ref 70–99)
Glucose-Capillary: 139 mg/dL — ABNORMAL HIGH (ref 70–99)

## 2023-07-24 LAB — BASIC METABOLIC PANEL WITH GFR
Anion gap: 8 (ref 5–15)
Anion gap: 10 (ref 5–15)
Anion gap: 10 (ref 5–15)
BUN: 28 mg/dL — ABNORMAL HIGH (ref 6–20)
BUN: 26 mg/dL — ABNORMAL HIGH (ref 6–20)
BUN: 27 mg/dL — ABNORMAL HIGH (ref 6–20)
CO2: 29 mmol/L (ref 22–32)
CO2: 26 mmol/L (ref 22–32)
CO2: 23 mmol/L (ref 22–32)
Calcium: 9.2 mg/dL (ref 8.9–10.3)
Calcium: 8.5 mg/dL — ABNORMAL LOW (ref 8.9–10.3)
Calcium: 8.5 mg/dL — ABNORMAL LOW (ref 8.9–10.3)
Chloride: 91 mmol/L — ABNORMAL LOW (ref 98–111)
Chloride: 101 mmol/L (ref 98–111)
Chloride: 100 mmol/L (ref 98–111)
Creatinine, Ser: 1.58 mg/dL — ABNORMAL HIGH (ref 0.44–1.00)
Creatinine, Ser: 1.55 mg/dL — ABNORMAL HIGH (ref 0.44–1.00)
Creatinine, Ser: 1.51 mg/dL — ABNORMAL HIGH (ref 0.44–1.00)
GFR, Estimated: 41 mL/min — ABNORMAL LOW (ref >60)
GFR, Estimated: 42 mL/min — ABNORMAL LOW (ref >60)
GFR, Estimated: 43 mL/min — ABNORMAL LOW (ref >60)
Glucose, Bld: 104 mg/dL — ABNORMAL HIGH (ref 70–99)
Glucose, Bld: 66 mg/dL — ABNORMAL LOW (ref 70–99)
Glucose, Bld: 136 mg/dL — ABNORMAL HIGH (ref 70–99)
Potassium: 4 mmol/L (ref 3.5–5.1)
Potassium: 3.4 mmol/L — ABNORMAL LOW (ref 3.5–5.1)
Potassium: 4.4 mmol/L (ref 3.5–5.1)
Sodium: 128 mmol/L — ABNORMAL LOW (ref 135–145)
Sodium: 137 mmol/L (ref 135–145)
Sodium: 133 mmol/L — ABNORMAL LOW (ref 135–145)

## 2023-07-24 LAB — COOXEMETRY PANEL
Carboxyhemoglobin: 2.3 % — ABNORMAL HIGH (ref 0.5–1.5)
Carboxyhemoglobin: 2.3 % — ABNORMAL HIGH (ref 0.5–1.5)
Methemoglobin: <0.7 % (ref 0.0–1.5)
Methemoglobin: <0.7 % (ref 0.0–1.5)
O2 Saturation: 50 %
O2 Saturation: 78.4 %
Total hemoglobin: 9.4 g/dL — ABNORMAL LOW (ref 12.0–16.0)
Total hemoglobin: 7.7 g/dL — ABNORMAL LOW (ref 12.0–16.0)

## 2023-07-24 LAB — DIC (DISSEMINATED INTRAVASCULAR COAGULATION)PANEL
D-Dimer, Quant: 3.23 ug{FEU}/mL — ABNORMAL HIGH (ref 0.00–0.50)
Fibrinogen: 427 mg/dL (ref 210–475)
INR: 1.6 — ABNORMAL HIGH (ref 0.8–1.2)
Platelets: 84 10*3/uL — ABNORMAL LOW (ref 150–400)
Prothrombin Time: 19.8 s — ABNORMAL HIGH (ref 11.4–15.2)
Smear Review: NONE SEEN
aPTT: 37 s — ABNORMAL HIGH (ref 24–36)

## 2023-07-24 LAB — PREPARE RBC (CROSSMATCH)

## 2023-07-24 LAB — POCT I-STAT EG7
Acid-Base Excess: 3 mmol/L — ABNORMAL HIGH (ref 0.0–2.0)
Bicarbonate: 28.2 mmol/L — ABNORMAL HIGH (ref 20.0–28.0)
Calcium, Ion: 1.09 mmol/L — ABNORMAL LOW (ref 1.15–1.40)
HCT: 24 % — ABNORMAL LOW (ref 36.0–46.0)
Hemoglobin: 8.2 g/dL — ABNORMAL LOW (ref 12.0–15.0)
O2 Saturation: 85 %
Potassium: 3.4 mmol/L — ABNORMAL LOW (ref 3.5–5.1)
Sodium: 135 mmol/L (ref 135–145)
TCO2: 30 mmol/L (ref 22–32)
pCO2, Ven: 47.4 mmHg (ref 44–60)
pH, Ven: 7.383 (ref 7.25–7.43)
pO2, Ven: 52 mmHg — ABNORMAL HIGH (ref 32–45)

## 2023-07-24 LAB — PLATELET COUNT: Platelets: 99 10*3/uL — ABNORMAL LOW (ref 150–400)

## 2023-07-24 LAB — HEMOGLOBIN AND HEMATOCRIT, BLOOD
HCT: 19.9 % — ABNORMAL LOW (ref 36.0–46.0)
Hemoglobin: 6.1 g/dL — CL (ref 12.0–15.0)

## 2023-07-24 LAB — FIBRINOGEN: Fibrinogen: 385 mg/dL (ref 210–475)

## 2023-07-24 LAB — MAGNESIUM
Magnesium: 1.9 mg/dL (ref 1.7–2.4)
Magnesium: 2.2 mg/dL (ref 1.7–2.4)
Magnesium: 2.8 mg/dL — ABNORMAL HIGH (ref 1.7–2.4)

## 2023-07-24 LAB — APTT: aPTT: 37 s — ABNORMAL HIGH (ref 24–36)

## 2023-07-24 LAB — LACTATE DEHYDROGENASE: LDH: 558 U/L — ABNORMAL HIGH (ref 98–192)

## 2023-07-24 LAB — HEPARIN LEVEL (UNFRACTIONATED): Heparin Unfractionated: 0.44 [IU]/mL (ref 0.30–0.70)

## 2023-07-24 SURGERY — INSERTION OF IMPLANTABLE LEFT VENTRICULAR ASSIST DEVICE
Anesthesia: General | Site: Chest

## 2023-07-24 MED ORDER — SODIUM CHLORIDE 0.9 % IV SOLN
600.0000 mg | Freq: Once | INTRAVENOUS | Status: AC
  Administered 2023-07-25: 600 mg via INTRAVENOUS
  Filled 2023-07-24: qty 10

## 2023-07-24 MED ORDER — HEPARIN SODIUM (PORCINE) 1000 UNIT/ML IJ SOLN
INTRAMUSCULAR | Status: AC
  Filled 2023-07-24: qty 1

## 2023-07-24 MED ORDER — FAMOTIDINE IN NACL 20-0.9 MG/50ML-% IV SOLN
20.0000 mg | Freq: Two times a day (BID) | INTRAVENOUS | Status: AC
  Administered 2023-07-24: 20 mg via INTRAVENOUS
  Filled 2023-07-24: qty 50

## 2023-07-24 MED ORDER — SODIUM CHLORIDE 0.9% IV SOLUTION: Freq: Once | INTRAVENOUS | Status: DC

## 2023-07-24 MED ORDER — PROTAMINE SULFATE 10 MG/ML IV SOLN
INTRAVENOUS | Status: DC | PRN
  Administered 2023-07-24: 50 mg via INTRAVENOUS
  Administered 2023-07-24: 20 mg via INTRAVENOUS
  Administered 2023-07-24 (×5): 50 mg via INTRAVENOUS

## 2023-07-24 MED ORDER — TRAMADOL HCL 50 MG PO TABS
50.0000 mg | ORAL_TABLET | ORAL | Status: DC | PRN
  Administered 2023-07-29 – 2023-07-31 (×5): 100 mg via ORAL
  Filled 2023-07-24 (×5): qty 2

## 2023-07-24 MED ORDER — 0.9 % SODIUM CHLORIDE (POUR BTL) OPTIME
TOPICAL | Status: DC | PRN
  Administered 2023-07-24: 5000 mL

## 2023-07-24 MED ORDER — ROCURONIUM BROMIDE 10 MG/ML (PF) SYRINGE
PREFILLED_SYRINGE | INTRAVENOUS | Status: AC
  Filled 2023-07-24: qty 10

## 2023-07-24 MED ORDER — SODIUM CHLORIDE (PF) 0.9 % IJ SOLN
INTRAMUSCULAR | Status: AC
  Filled 2023-07-24: qty 30

## 2023-07-24 MED ORDER — ACETAMINOPHEN 650 MG RE SUPP: 650.0000 mg | Freq: Once | RECTAL | Status: AC

## 2023-07-24 MED ORDER — FENTANYL CITRATE (PF) 250 MCG/5ML IJ SOLN
INTRAMUSCULAR | Status: AC
  Filled 2023-07-24: qty 5

## 2023-07-24 MED ORDER — CHLORHEXIDINE GLUCONATE 0.12 % MT SOLN
15.0000 mL | OROMUCOSAL | Status: AC
  Administered 2023-07-24: 15 mL via OROMUCOSAL
  Filled 2023-07-24: qty 15

## 2023-07-24 MED ORDER — MIDAZOLAM HCL (PF) 5 MG/ML IJ SOLN
INTRAMUSCULAR | Status: DC | PRN
  Administered 2023-07-24 (×3): 2 mg via INTRAVENOUS

## 2023-07-24 MED ORDER — LACTATED RINGERS IV SOLN: INTRAVENOUS | Status: DC | PRN

## 2023-07-24 MED ORDER — THROMBIN 20000 UNITS EX SOLR
OROMUCOSAL | Status: DC | PRN
  Administered 2023-07-24 (×3): 4 mL

## 2023-07-24 MED ORDER — ALBUMIN HUMAN 5 % IV SOLN
250.0000 mL | INTRAVENOUS | Status: DC | PRN
  Administered 2023-07-24 (×3): 12.5 g via INTRAVENOUS

## 2023-07-24 MED ORDER — ACETAMINOPHEN 160 MG/5ML PO SOLN
650.0000 mg | Freq: Once | ORAL | Status: AC
  Administered 2023-07-24: 650 mg
  Filled 2023-07-24: qty 20.3

## 2023-07-24 MED ORDER — POTASSIUM CHLORIDE 10 MEQ/50ML IV SOLN
10.0000 meq | INTRAVENOUS | Status: AC
  Administered 2023-07-24 (×3): 10 meq via INTRAVENOUS

## 2023-07-24 MED ORDER — INSULIN REGULAR(HUMAN) IN NACL 100-0.9 UT/100ML-% IV SOLN: INTRAVENOUS | Status: DC

## 2023-07-24 MED ORDER — LACTATED RINGERS IV SOLN: INTRAVENOUS | Status: AC

## 2023-07-24 MED ORDER — ACETAMINOPHEN 500 MG PO TABS
1000.0000 mg | ORAL_TABLET | Freq: Four times a day (QID) | ORAL | Status: AC
  Administered 2023-07-27 – 2023-07-29 (×10): 1000 mg via ORAL
  Filled 2023-07-24 (×11): qty 2

## 2023-07-24 MED ORDER — ONDANSETRON HCL 4 MG/2ML IJ SOLN
4.0000 mg | Freq: Four times a day (QID) | INTRAMUSCULAR | Status: DC | PRN
  Administered 2023-07-24: 4 mg via INTRAVENOUS
  Filled 2023-07-24 (×2): qty 2

## 2023-07-24 MED ORDER — MIDAZOLAM HCL (PF) 10 MG/2ML IJ SOLN
INTRAMUSCULAR | Status: AC
  Filled 2023-07-24: qty 2

## 2023-07-24 MED ORDER — VANCOMYCIN HCL IN DEXTROSE 1-5 GM/200ML-% IV SOLN: 1000.0000 mg | Freq: Two times a day (BID) | INTRAVENOUS | Status: DC

## 2023-07-24 MED ORDER — HEPARIN SODIUM (PORCINE) 1000 UNIT/ML IJ SOLN
INTRAMUSCULAR | Status: DC | PRN
  Administered 2023-07-24: 44000 [IU] via INTRAVENOUS

## 2023-07-24 MED ORDER — HEPARIN 6000 UNIT IRRIGATION SOLUTION
Status: AC
  Filled 2023-07-24: qty 500

## 2023-07-24 MED ORDER — HEPARIN 6000 UNIT IRRIGATION SOLUTION
Status: DC | PRN
  Administered 2023-07-24: 1

## 2023-07-24 MED ORDER — PROPOFOL 10 MG/ML IV BOLUS
INTRAVENOUS | Status: DC | PRN
  Administered 2023-07-24: 40 mg via INTRAVENOUS

## 2023-07-24 MED ORDER — MIDAZOLAM HCL 2 MG/2ML IJ SOLN
2.0000 mg | INTRAMUSCULAR | Status: DC | PRN
  Administered 2023-07-24 (×3): 2 mg via INTRAVENOUS
  Filled 2023-07-24 (×3): qty 2

## 2023-07-24 MED ORDER — DEXTROSE 50 % IV SOLN
0.0000 mL | INTRAVENOUS | Status: DC | PRN
  Administered 2023-07-24: 50 mL via INTRAVENOUS
  Filled 2023-07-24: qty 50

## 2023-07-24 MED ORDER — BISACODYL 10 MG RE SUPP
10.0000 mg | Freq: Every day | RECTAL | Status: DC
Start: 2023-07-25 — End: 2023-07-28

## 2023-07-24 MED ORDER — LACTATED RINGERS IV SOLN
INTRAVENOUS | Status: DC | PRN
Start: 2023-07-24 — End: 2023-07-24

## 2023-07-24 MED ORDER — BISACODYL 5 MG PO TBEC
10.0000 mg | DELAYED_RELEASE_TABLET | Freq: Every day | ORAL | Status: DC
  Administered 2023-07-26 – 2023-07-27 (×2): 10 mg via ORAL
  Filled 2023-07-24 (×2): qty 2

## 2023-07-24 MED ORDER — EPINEPHRINE HCL 5 MG/250ML IV SOLN IN NS: 0.0000 ug/min | INTRAVENOUS | Status: DC

## 2023-07-24 MED ORDER — GABAPENTIN 250 MG/5ML PO SOLN
200.0000 mg | Freq: Every day | ORAL | Status: DC
  Administered 2023-07-24: 200 mg
  Filled 2023-07-24 (×3): qty 4

## 2023-07-24 MED ORDER — SODIUM CHLORIDE 0.9% FLUSH
3.0000 mL | Freq: Two times a day (BID) | INTRAVENOUS | Status: DC
  Administered 2023-07-25 – 2023-07-27 (×4): 3 mL via INTRAVENOUS

## 2023-07-24 MED ORDER — ACETAMINOPHEN 160 MG/5ML PO SOLN
1000.0000 mg | Freq: Four times a day (QID) | ORAL | Status: AC
Start: 2023-07-25 — End: 2023-07-29
  Administered 2023-07-25 – 2023-07-27 (×9): 1000 mg
  Filled 2023-07-24 (×9): qty 40.6

## 2023-07-24 MED ORDER — CEFAZOLIN SODIUM-DEXTROSE 2-4 GM/100ML-% IV SOLN
2.0000 g | Freq: Three times a day (TID) | INTRAVENOUS | Status: AC
  Administered 2023-07-24 – 2023-07-26 (×6): 2 g via INTRAVENOUS
  Filled 2023-07-24 (×6): qty 100

## 2023-07-24 MED ORDER — PANTOPRAZOLE SODIUM 40 MG PO TBEC
40.0000 mg | DELAYED_RELEASE_TABLET | Freq: Every day | ORAL | Status: DC
  Administered 2023-07-26 – 2023-08-05 (×11): 40 mg via ORAL
  Filled 2023-07-24 (×11): qty 1

## 2023-07-24 MED ORDER — ORAL CARE MOUTH RINSE
15.0000 mL | OROMUCOSAL | Status: DC | PRN
  Administered 2023-07-24: 15 mL via OROMUCOSAL

## 2023-07-24 MED ORDER — HEMOSTATIC AGENTS (NO CHARGE) OPTIME
TOPICAL | Status: DC | PRN
  Administered 2023-07-24 (×2): 1 via TOPICAL

## 2023-07-24 MED ORDER — LIDOCAINE 2% (20 MG/ML) 5 ML SYRINGE
INTRAMUSCULAR | Status: DC | PRN
  Administered 2023-07-24: 20 mg via INTRAVENOUS

## 2023-07-24 MED ORDER — SODIUM CHLORIDE 0.9 % IV SOLN: INTRAVENOUS | Status: AC

## 2023-07-24 MED ORDER — MORPHINE SULFATE (PF) 2 MG/ML IV SOLN
1.0000 mg | INTRAVENOUS | Status: DC | PRN
  Administered 2023-07-24 – 2023-07-25 (×6): 2 mg via INTRAVENOUS
  Administered 2023-07-25: 4 mg via INTRAVENOUS
  Administered 2023-07-25 – 2023-07-27 (×8): 2 mg via INTRAVENOUS
  Filled 2023-07-24 (×7): qty 1
  Filled 2023-07-24: qty 2
  Filled 2023-07-24 (×7): qty 1

## 2023-07-24 MED ORDER — ASPIRIN 300 MG RE SUPP: 300.0000 mg | Freq: Every day | RECTAL | Status: DC

## 2023-07-24 MED ORDER — SODIUM CHLORIDE 0.9% FLUSH: 3.0000 mL | INTRAVENOUS | Status: DC | PRN

## 2023-07-24 MED ORDER — FENTANYL CITRATE (PF) 250 MCG/5ML IJ SOLN
INTRAMUSCULAR | Status: AC
Start: 2023-07-24 — End: 2023-07-24
  Filled 2023-07-24: qty 5

## 2023-07-24 MED ORDER — METOCLOPRAMIDE HCL 5 MG/ML IJ SOLN
10.0000 mg | Freq: Four times a day (QID) | INTRAMUSCULAR | Status: DC
  Administered 2023-07-24 – 2023-07-28 (×15): 10 mg via INTRAVENOUS
  Filled 2023-07-24 (×14): qty 2

## 2023-07-24 MED ORDER — SODIUM CHLORIDE (PF) 0.9 % IJ SOLN
INTRAMUSCULAR | Status: DC | PRN
  Administered 2023-07-24: 1000 mL

## 2023-07-24 MED ORDER — PROTAMINE SULFATE 10 MG/ML IV SOLN
INTRAVENOUS | Status: AC
Start: 2023-07-24 — End: 2023-07-24
  Filled 2023-07-24: qty 25

## 2023-07-24 MED ORDER — OXYCODONE HCL 5 MG PO TABS
5.0000 mg | ORAL_TABLET | ORAL | Status: DC | PRN
  Administered 2023-07-25 (×3): 10 mg via ORAL
  Administered 2023-07-26: 5 mg via ORAL
  Administered 2023-07-26: 10 mg via ORAL
  Administered 2023-07-26: 5 mg via ORAL
  Administered 2023-07-26: 10 mg via ORAL
  Administered 2023-07-27 – 2023-07-28 (×5): 5 mg via ORAL
  Administered 2023-07-28 (×2): 10 mg via ORAL
  Administered 2023-07-29 (×2): 5 mg via ORAL
  Administered 2023-07-29 – 2023-08-01 (×11): 10 mg via ORAL
  Filled 2023-07-24: qty 1
  Filled 2023-07-24 (×3): qty 2
  Filled 2023-07-24: qty 1
  Filled 2023-07-24 (×2): qty 2
  Filled 2023-07-24 (×2): qty 1
  Filled 2023-07-24 (×3): qty 2
  Filled 2023-07-24: qty 1
  Filled 2023-07-24 (×3): qty 2
  Filled 2023-07-24: qty 1
  Filled 2023-07-24 (×5): qty 2
  Filled 2023-07-24 (×2): qty 1
  Filled 2023-07-24 (×2): qty 2
  Filled 2023-07-24: qty 1
  Filled 2023-07-24: qty 2

## 2023-07-24 MED ORDER — NOREPINEPHRINE 4 MG/250ML-% IV SOLN: 0.0000 ug/min | INTRAVENOUS | Status: DC

## 2023-07-24 MED ORDER — NOREPINEPHRINE 4 MG/250ML-% IV SOLN
0.0000 ug/min | INTRAVENOUS | Status: DC
  Administered 2023-07-24: 16 ug/min via INTRAVENOUS
  Administered 2023-07-25: 6 ug/min via INTRAVENOUS
  Administered 2023-07-25: 10 ug/min via INTRAVENOUS
  Filled 2023-07-24 (×3): qty 250

## 2023-07-24 MED ORDER — VANCOMYCIN HCL 1000 MG IV SOLR
INTRAVENOUS | Status: DC | PRN
  Administered 2023-07-24: 1000 mg

## 2023-07-24 MED ORDER — NOREPINEPHRINE BITARTRATE 1 MG/ML IV SOLN
INTRAVENOUS | Status: DC | PRN
  Administered 2023-07-24: 1 mL via INTRAVENOUS

## 2023-07-24 MED ORDER — EPINEPHRINE HCL 5 MG/250ML IV SOLN IN NS
0.5000 ug/min | INTRAVENOUS | Status: DC
  Filled 2023-07-24: qty 250

## 2023-07-24 MED ORDER — DEXMEDETOMIDINE HCL IN NACL 400 MCG/100ML IV SOLN
0.0000 ug/kg/h | INTRAVENOUS | Status: DC
  Administered 2023-07-24 – 2023-07-25 (×3): 0.7 ug/kg/h via INTRAVENOUS
  Filled 2023-07-24 (×3): qty 100

## 2023-07-24 MED ORDER — SODIUM CHLORIDE 0.45 % IV SOLN: INTRAVENOUS | Status: AC | PRN

## 2023-07-24 MED ORDER — PLASMA-LYTE A IV SOLN
INTRAVENOUS | Status: DC
  Filled 2023-07-24: qty 2.5

## 2023-07-24 MED ORDER — DOCUSATE SODIUM 100 MG PO CAPS
200.0000 mg | ORAL_CAPSULE | Freq: Every day | ORAL | Status: DC
Start: 2023-07-25 — End: 2023-08-01
  Administered 2023-07-26 – 2023-08-01 (×7): 200 mg via ORAL
  Filled 2023-07-24 (×7): qty 2

## 2023-07-24 MED ORDER — SODIUM CHLORIDE 0.9 % IV SOLN: INTRAVENOUS | Status: DC | PRN

## 2023-07-24 MED ORDER — PROTAMINE SULFATE 10 MG/ML IV SOLN
INTRAVENOUS | Status: AC
  Filled 2023-07-24: qty 25

## 2023-07-24 MED ORDER — VANCOMYCIN HCL IN DEXTROSE 1-5 GM/200ML-% IV SOLN
1000.0000 mg | Freq: Two times a day (BID) | INTRAVENOUS | Status: AC
  Administered 2023-07-24 – 2023-07-25 (×3): 1000 mg via INTRAVENOUS
  Filled 2023-07-24 (×3): qty 200

## 2023-07-24 MED ORDER — THROMBIN (RECOMBINANT) 20000 UNITS EX SOLR
CUTANEOUS | Status: AC
  Filled 2023-07-24: qty 20000

## 2023-07-24 MED ORDER — SODIUM CHLORIDE 0.9 % IV SOLN: 250.0000 mL | INTRAVENOUS | Status: AC

## 2023-07-24 MED ORDER — VASOPRESSIN 20 UNITS/100 ML INFUSION FOR SHOCK
0.0000 [IU]/min | INTRAVENOUS | Status: DC
  Administered 2023-07-25 (×2): 0.03 [IU]/min via INTRAVENOUS
  Filled 2023-07-24 (×3): qty 100

## 2023-07-24 MED ORDER — MAGNESIUM SULFATE 4 GM/100ML IV SOLN
4.0000 g | Freq: Once | INTRAVENOUS | Status: AC
  Administered 2023-07-24: 4 g via INTRAVENOUS
  Filled 2023-07-24: qty 100

## 2023-07-24 MED ORDER — PROPOFOL 10 MG/ML IV BOLUS
INTRAVENOUS | Status: AC
  Filled 2023-07-24: qty 20

## 2023-07-24 MED ORDER — ASPIRIN 325 MG PO TBEC
325.0000 mg | DELAYED_RELEASE_TABLET | Freq: Every day | ORAL | Status: DC
  Administered 2023-07-26: 325 mg via ORAL
  Filled 2023-07-24: qty 1

## 2023-07-24 MED ORDER — FENTANYL CITRATE (PF) 250 MCG/5ML IJ SOLN
INTRAMUSCULAR | Status: DC | PRN
Start: 2023-07-24 — End: 2023-07-24
  Administered 2023-07-24 (×4): 100 ug via INTRAVENOUS
  Administered 2023-07-24: 200 ug via INTRAVENOUS
  Administered 2023-07-24: 150 ug via INTRAVENOUS

## 2023-07-24 MED ORDER — ROCURONIUM BROMIDE 10 MG/ML (PF) SYRINGE
PREFILLED_SYRINGE | INTRAVENOUS | Status: DC | PRN
  Administered 2023-07-24: 50 mg via INTRAVENOUS
  Administered 2023-07-24: 30 mg via INTRAVENOUS
  Administered 2023-07-24 (×2): 50 mg via INTRAVENOUS
  Administered 2023-07-24: 70 mg via INTRAVENOUS

## 2023-07-24 MED ORDER — LACTATED RINGERS IV SOLN: 500.0000 mL | Freq: Once | INTRAVENOUS | Status: DC | PRN

## 2023-07-24 MED ORDER — ORAL CARE MOUTH RINSE
15.0000 mL | OROMUCOSAL | Status: DC
  Administered 2023-07-24 – 2023-07-25 (×10): 15 mL via OROMUCOSAL

## 2023-07-24 MED ORDER — FLUCONAZOLE IN SODIUM CHLORIDE 400-0.9 MG/200ML-% IV SOLN
400.0000 mg | Freq: Once | INTRAVENOUS | Status: AC
  Administered 2023-07-25: 400 mg via INTRAVENOUS
  Filled 2023-07-24: qty 200

## 2023-07-24 MED ORDER — CHLORHEXIDINE GLUCONATE CLOTH 2 % EX PADS
6.0000 | MEDICATED_PAD | Freq: Every day | CUTANEOUS | Status: DC
  Administered 2023-07-24 – 2023-08-05 (×13): 6 via TOPICAL

## 2023-07-24 MED ORDER — ASPIRIN 81 MG PO CHEW
324.0000 mg | CHEWABLE_TABLET | Freq: Every day | ORAL | Status: DC
Start: 2023-07-25 — End: 2023-07-27
  Administered 2023-07-25: 324 mg
  Filled 2023-07-24: qty 4

## 2023-07-24 MED ORDER — LIDOCAINE 2% (20 MG/ML) 5 ML SYRINGE
INTRAMUSCULAR | Status: AC
  Filled 2023-07-24: qty 5

## 2023-07-24 MED ORDER — SODIUM CHLORIDE 0.9 % IV SOLN
20.0000 ug | Freq: Once | INTRAVENOUS | Status: AC
  Administered 2023-07-24: 20 ug via INTRAVENOUS
  Filled 2023-07-24: qty 5

## 2023-07-24 MED ORDER — VASOPRESSIN 20 UNIT/ML IV SOLN
INTRAVENOUS | Status: AC
  Filled 2023-07-24: qty 1

## 2023-07-24 SURGICAL SUPPLY — 95 items
ADAPTER CARDIO PERF ANTE/RETRO (ADAPTER) IMPLANT
BAG DECANTER FOR FLEXI CONT (MISCELLANEOUS) ×3 IMPLANT
BLADE CLIPPER SURG (BLADE) ×6 IMPLANT
BLADE STERNUM SYSTEM 6 (BLADE) ×6 IMPLANT
BLADE SURG 15 STRL LF DISP TIS (BLADE) ×3 IMPLANT
BLOOD HAEMOCONCENTR 700 MIDI (MISCELLANEOUS) IMPLANT
CANISTER SUCTION 3000ML PPV (SUCTIONS) ×6 IMPLANT
CANNULA AORTIC ROOT 9FR (CANNULA) ×3 IMPLANT
CANNULA ARTERIAL NVNT 3/8 20FR (MISCELLANEOUS) ×3 IMPLANT
CANNULA GUNDRY RCSP 15FR (MISCELLANEOUS) ×3 IMPLANT
CANNULA PRFSN 3/8XCNCT RT ANG (MISCELLANEOUS) IMPLANT
CANNULA SUMP PERICARDIAL (CANNULA) ×3 IMPLANT
CANNULA VRC MALB SNGL STG 36FR (MISCELLANEOUS) IMPLANT
CATH FOLEY 2WAY SLVR 5CC 14FR (CATHETERS) ×3 IMPLANT
CATH HEART VENT LEFT (CATHETERS) IMPLANT
CATH ROBINSON RED A/P 18FR (CATHETERS) ×9 IMPLANT
CATH THORACIC 28FR (CATHETERS) IMPLANT
CATH THORACIC 36FR (CATHETERS) ×3 IMPLANT
CATH THORACIC 36FR RT ANG (CATHETERS) ×3 IMPLANT
CNTNR URN SCR LID CUP LEK RST (MISCELLANEOUS) IMPLANT
CONN 1/2X1/2X1/2 BEN (MISCELLANEOUS) ×3 IMPLANT
CONN ST 3/8 X 1/2 (MISCELLANEOUS) ×6 IMPLANT
CONTAINER PROTECT SURGISLUSH (MISCELLANEOUS) ×6 IMPLANT
DEVICE SUT CK QUICK LOAD INDV (Prosthesis & Implant Heart) IMPLANT
DEVICE SUT CK QUICK LOAD MINI (Prosthesis & Implant Heart) IMPLANT
DRAIN CHANNEL 28F RND 3/8 FF (WOUND CARE) IMPLANT
DRAIN CONNECTOR BLAKE 1:1 (MISCELLANEOUS) IMPLANT
DRAPE CV SPLIT W-CLR ANES SCRN (DRAPES) ×3 IMPLANT
DRAPE INCISE IOBAN 66X45 STRL (DRAPES) ×6 IMPLANT
DRAPE PERI GROIN 82X75IN TIB (DRAPES) ×3 IMPLANT
DRAPE WARM FLUID 44X44 (DRAPES) IMPLANT
DRSG COVADERM 4X14 (GAUZE/BANDAGES/DRESSINGS) ×3 IMPLANT
DRSG COVADERM 4X8 (GAUZE/BANDAGES/DRESSINGS) IMPLANT
ELECT BLADE 6.5 EXT (BLADE) ×3 IMPLANT
ELECT CAUTERY BLADE 6.4 (BLADE) ×6 IMPLANT
ELECTRODE BLDE 4.0 EZ CLN MEGD (MISCELLANEOUS) ×3 IMPLANT
ELECTRODE REM PT RTRN 9FT ADLT (ELECTROSURGICAL) ×6 IMPLANT
FELT TEFLON 1X6 (MISCELLANEOUS) ×6 IMPLANT
GAUZE SPONGE 4X4 12PLY STRL (GAUZE/BANDAGES/DRESSINGS) ×6 IMPLANT
GLOVE SURG MICRO LTX SZ7 (GLOVE) ×6 IMPLANT
GOWN STRL REUS W/ TWL LRG LVL3 (GOWN DISPOSABLE) ×12 IMPLANT
GOWN STRL REUS W/ TWL XL LVL3 (GOWN DISPOSABLE) ×3 IMPLANT
HEMOSTAT POWDER SURGIFOAM 1G (HEMOSTASIS) ×9 IMPLANT
HEMOSTAT SURGICEL 2X14 (HEMOSTASIS) ×3 IMPLANT
INSERT FOGARTY XLG (MISCELLANEOUS) IMPLANT
IV CATH 18G X1.75 CATHLON (IV SOLUTION) IMPLANT
KIT BASIN OR (CUSTOM PROCEDURE TRAY) ×6 IMPLANT
KIT LVAD HEARTMATE 3 W-CNTRL (Prosthesis & Implant Heart) IMPLANT
KIT SUCTION CATH 14FR (SUCTIONS) ×6 IMPLANT
KIT SUT CK MINI COMBO 4X17 (Prosthesis & Implant Heart) IMPLANT
KIT TURNOVER KIT B (KITS) ×6 IMPLANT
LINE VENT (MISCELLANEOUS) IMPLANT
LOOP VASCLR EXTRA MAXI WHITE (MISCELLANEOUS) ×3 IMPLANT
LOOPS VASCLR EXTRA MAXI WHITE (MISCELLANEOUS) ×3 IMPLANT
NDL HYPO 21X1.5 SAFETY (NEEDLE) IMPLANT
NEEDLE HYPO 21X1.5 SAFETY (NEEDLE) ×3 IMPLANT
NS IRRIG 1000ML POUR BTL (IV SOLUTION) ×15 IMPLANT
PACK OPEN HEART (CUSTOM PROCEDURE TRAY) ×6 IMPLANT
PAD ARMBOARD POSITIONER FOAM (MISCELLANEOUS) ×6 IMPLANT
POSITIONER HEAD DONUT 9IN (MISCELLANEOUS) ×3 IMPLANT
PUNCH AORTIC ROTATE 4.5MM 8IN (MISCELLANEOUS) ×3 IMPLANT
SEALANT HEMOST PREVELEAK 4ML (HEMOSTASIS) IMPLANT
SET MPS 3-ND DEL (MISCELLANEOUS) IMPLANT
SPONGE T-LAP 18X18 ~~LOC~~+RFID (SPONGE) ×12 IMPLANT
SPONGE T-LAP 4X18 ~~LOC~~+RFID (SPONGE) ×3 IMPLANT
SUT BONE WAX W31G (SUTURE) ×3 IMPLANT
SUT ETHIBOND 2 0 SH (SUTURE) IMPLANT
SUT ETHIBOND NAB MH 2-0 36IN (SUTURE) ×36 IMPLANT
SUT PROLENE 3 0 SH 48 (SUTURE) ×12 IMPLANT
SUT PROLENE 3 0 SH DA (SUTURE) ×6 IMPLANT
SUT PROLENE 3 0 SH1 36 (SUTURE) ×3 IMPLANT
SUT PROLENE 4-0 RB1 .5 CRCL 36 (SUTURE) ×6 IMPLANT
SUT PROLENE 5 0 C 1 36 (SUTURE) IMPLANT
SUT SILK 1 MH (SUTURE) ×12 IMPLANT
SUT SILK 1 TIES 10X30 (SUTURE) ×3 IMPLANT
SUT SILK 2 0 SH CR/8 (SUTURE) IMPLANT
SUT STEEL STERNAL CCS#1 18IN (SUTURE) IMPLANT
SUT STEEL SZ 6 DBL 3X14 BALL (SUTURE) ×3 IMPLANT
SUT VIC AB 1 CTX36XBRD ANBCTR (SUTURE) ×6 IMPLANT
SUT VIC AB 2-0 CTX 27 (SUTURE) ×6 IMPLANT
SUT VIC AB 3-0 SH 27X BRD (SUTURE) IMPLANT
SUT VIC AB 3-0 X1 27 (SUTURE) ×6 IMPLANT
SYSTEM SAHARA CHEST DRAIN ATS (WOUND CARE) ×3 IMPLANT
TAPE CLOTH SURG 4X10 WHT LF (GAUZE/BANDAGES/DRESSINGS) IMPLANT
TAPE PAPER 2X10 WHT MICROPORE (GAUZE/BANDAGES/DRESSINGS) IMPLANT
TOWEL GREEN STERILE (TOWEL DISPOSABLE) ×3 IMPLANT
TOWEL GREEN STERILE FF (TOWEL DISPOSABLE) ×3 IMPLANT
TRAY CATH LUMEN 1 20CM STRL (SET/KITS/TRAYS/PACK) ×3 IMPLANT
TRAY FOLEY SLVR 16FR TEMP STAT (SET/KITS/TRAYS/PACK) ×3 IMPLANT
TUBE CONNECTING 12X1/4 (SUCTIONS) ×3 IMPLANT
TUBE SUCT INTRACARD DLP 20F (MISCELLANEOUS) ×3 IMPLANT
UNDERPAD 30X36 HEAVY ABSORB (UNDERPADS AND DIAPERS) ×6 IMPLANT
VALVE MITRAL SZ 33 (Prosthesis & Implant Heart) IMPLANT
WATER STERILE IRR 1000ML POUR (IV SOLUTION) ×12 IMPLANT
YANKAUER SUCT BULB TIP NO VENT (SUCTIONS) ×3 IMPLANT

## 2023-07-24 NOTE — Progress Notes (Addendum)
 Pharmacy Antibiotic Note  [name] [name] is a 48 y.o. female admitted on 07/09/2023 with VAD insertion 7/3.  Pharmacy has been consulted for vancomycin  dosing x 48 hours post-op prophylaxis.  Patient received 1500 mg vancomycin  at 0900. Scr 1.4 (0.9 in June 2025). Vancomycin  1000 mg q12h would be AUC ~ 400 based on Scr 1.5, VD 0.9   Plan:  Vancomycin  1000 mg q12h x 3 doses ordered   Height: 5' 10 (177.8 cm) Weight: (P) 110.5 kg (243 lb 9.7 oz) IBW/kg (Calculated) : 68.5  Temp (24hrs), Avg:98 F (36.7 C), Min:97.8 F (36.6 C), Max:98.5 F (36.9 C)  Recent Labs  Lab 07/20/23 0520 07/21/23 0521 07/22/23 0500 07/23/23 0417 07/24/23 0405 07/24/23 0816 07/24/23 1010 07/24/23 1109 07/24/23 1158 07/24/23 1227 07/24/23 1337  WBC 6.9 7.7 7.8 9.3 7.1  --   --   --   --   --   --   CREATININE 1.16* 1.17* 1.43* 1.46* 1.58*   < > 1.50* 1.50* 1.50* 1.50* 1.40*   < > = values in this interval not displayed.    Estimated Creatinine Clearance: 67.6 mL/min (A) (by C-G formula based on SCr of 1.4 mg/dL (H)).    Allergies  Allergen Reactions   Sulfa  Antibiotics Itching, Swelling and Rash    Lip swelling    Thank you for allowing pharmacy to be a part of this patient's care.  Rankin Sams 07/24/2023 3:46 PM

## 2023-07-24 NOTE — Anesthesia Procedure Notes (Signed)
 Arterial Line Insertion Start/End7/03/2023 7:50 AM, 07/24/2023 7:55 AM Performed by: Dorethea Cordella SQUIBB, DO  Patient location: OR. Preanesthetic checklist: patient identified, IV checked, site marked, risks and benefits discussed, surgical consent, monitors and equipment checked, pre-op evaluation, timeout performed and anesthesia consent Lidocaine  1% used for infiltration and patient sedated Right, brachial was placed Catheter size: 20 G Hand hygiene performed  and maximum sterile barriers used   Attempts: 1 Procedure performed using ultrasound guided technique. Ultrasound Notes:anatomy identified, needle tip was noted to be adjacent to the nerve/plexus identified, no ultrasound evidence of intravascular and/or intraneural injection and image(s) printed for medical record Following insertion, dressing applied and Biopatch. Post procedure assessment: normal and unchanged  Patient tolerated the procedure well with no immediate complications.

## 2023-07-24 NOTE — Progress Notes (Signed)
 Morning ABG attempt unsuccessful. Pt requesting no more sticks at this time. RT will continue to monitor and be available asneeded.

## 2023-07-24 NOTE — Plan of Care (Signed)

## 2023-07-24 NOTE — Anesthesia Procedure Notes (Signed)
 Central Venous Catheter Insertion Performed by: Dorethea Cordella SQUIBB, DO, anesthesiologist Start/End7/03/2023 7:46 AM, 07/24/2023 7:47 AM Patient location: Pre-op. Preanesthetic checklist: patient identified, IV checked, site marked, risks and benefits discussed, surgical consent, monitors and equipment checked, pre-op evaluation, timeout performed and anesthesia consent Position: supine Hand hygiene performed  and maximum sterile barriers used  PA cath was placed.Swan type:thermodilution PA Cath depth:80 Procedure performed without using ultrasound guided technique. Attempts: 1 Patient tolerated the procedure well with no immediate complications.

## 2023-07-24 NOTE — Progress Notes (Signed)
 RT x 2 assisted with patient transport on vent and nitric from OR15 to 2H03 without complications.

## 2023-07-24 NOTE — Interval H&P Note (Signed)
 History and Physical Interval Note:  07/24/2023 6:45 AM  [name] [name]  has presented today for surgery, with the diagnosis of End stage heart failure severe MR.  The various methods of treatment have been discussed with the patient and family. After consideration of risks, benefits and other options for treatment, the patient has consented to  Procedure(s): INSERTION OF IMPLANTABLE LEFT VENTRICULAR ASSIST DEVICE (N/A) REPLACEMENT, MITRAL VALVE (N/A) ECHOCARDIOGRAM, TRANSESOPHAGEAL (N/A) as a surgical intervention.  The patient's history has been reviewed, patient examined, no change in status, stable for surgery.  I have reviewed the patient's chart and labs.  Questions were answered to the patient's satisfaction.     Makynleigh Breslin K Fantashia Shupert

## 2023-07-24 NOTE — Transfer of Care (Signed)
 Immediate Anesthesia Transfer of Care Note  Patient: [name] [name]  Procedure(s) Performed: INSERTION OF IMPLANTABLE LEFT VENTRICULAR ASSIST DEVICE USING HEARTMATE 3 (Chest) MITRAL VALVE REPLACEMENT USING MEDTRONIC MOSAIC PORCINE BIOPROSTHESIS MITRAL VALVE SIZE (Chest) ECHOCARDIOGRAM, TRANSESOPHAGEAL REMOVAL, CARDIAC ASSIST DEVICE, IMPELLA 5.5  Patient Location: ICU  Anesthesia Type:General  Level of Consciousness: sedated and Patient remains intubated per anesthesia plan  Airway & Oxygen Therapy: Patient remains intubated per anesthesia plan and Patient placed on Ventilator (see vital sign flow sheet for setting)  Post-op Assessment: Report given to RN and Post -op Vital signs reviewed and stable  Post vital signs: Reviewed and stable  Last Vitals:  Vitals Value Taken Time  BP 85/61 07/24/23 15:30  Temp    Pulse 79 07/24/23 15:38  Resp 15 07/24/23 15:41  SpO2    Vitals shown include unfiled device data.  Last Pain:  Vitals:   07/24/23 0611  TempSrc: (P) Oral  PainSc:       Patients Stated Pain Goal: 0 (07/22/23 1600)  Complications: No notable events documented.

## 2023-07-24 NOTE — TOC Progression Note (Addendum)
 Transition of Care [facility]) - Progression Note    Patient Details  Name: [name] [name] MRN: [id number] Date of Birth: Dec 29, 1975  Transition of Care [facility]) CM/SW Contact  [name] Phone Number: [phone] 07/24/2023, 12:33 PM  Clinical Narrative: 11:21 AM- HF CSW attempted to meet with patient at bedside. Nurse stated that the patient was just taken to get MRI. HF CSW/NCM will follow up at a more appropriate time.   Hospital follow up appointment scheduled for Tuesday, July 29, 2023 at 9:40 AM.  PLEASE ARRIVE 10-15 minutes early.  PLEASE CALL to cancel/reschedule if you CANNOT make appointment.   TOC will continue following.       Expected Discharge Plan: IP Rehab Facility Barriers to Discharge: Continued Medical Work up  Expected Discharge Plan and Services   Discharge Planning Services: CM Consult Post Acute Care Choice: IP Rehab Living arrangements for the past 2 months: Single Family Home                           HH Arranged: RN [facility] Agency: [facility] Date HH Agency Contacted: 07/11/23 Time HH Agency Contacted: 1534 Representative spoke with at [facility] Agency: Pam RN   Social Determinants of Health (SDOH) Interventions SDOH Screenings   Food Insecurity: No Food Insecurity (05/20/2023)  Housing: Low Risk  (05/20/2023)  Transportation Needs: No Transportation Needs (05/20/2023)  Utilities: Not At Risk (05/20/2023)  Alcohol  Screen: Low Risk  (04/03/2023)  Depression (PHQ2-9): Low Risk  (05/26/2023)  Financial Resource Strain: Low Risk  (04/03/2023)  Physical Activity: Insufficiently Active (04/03/2023)  Social Connections: Socially Isolated (04/03/2023)  Stress: No Stress Concern Present (04/03/2023)  Tobacco Use: High Risk (07/24/2023)    Readmission Risk Interventions    07/03/2022   12:12 PM 02/13/2022   12:41 PM 12/31/2021    1:52 PM  Readmission Risk Prevention Plan  Transportation Screening Complete Complete   HRI or Home Care Consult Complete  Complete Complete  Social Work Consult for Recovery Care Planning/Counseling Complete Complete Complete  Palliative Care Screening Not Applicable Not Applicable Not Applicable  Medication Review Oceanographer) Complete Complete Complete

## 2023-07-24 NOTE — Anesthesia Procedure Notes (Signed)
 Procedure Name: Intubation Date/Time: 07/24/2023 7:21 AM  Performed by: Carolee Lauraine DASEN, CRNAPre-anesthesia Checklist: Patient identified, Emergency Drugs available, Suction available and Patient being monitored Patient Re-evaluated:Patient Re-evaluated prior to induction Oxygen Delivery Method: Circle System Utilized Preoxygenation: Pre-oxygenation with 100% oxygen Induction Type: IV induction Ventilation: Mask ventilation without difficulty Laryngoscope Size: Miller and 2 Grade View: Grade I Tube type: Oral Tube size: 8.0 mm Number of attempts: 1 Airway Equipment and Method: Stylet and Oral airway Placement Confirmation: ETT inserted through vocal cords under direct vision, positive ETCO2 and breath sounds checked- equal and bilateral Secured at: 22 cm Tube secured with: Tape Dental Injury: Teeth and Oropharynx as per pre-operative assessment

## 2023-07-24 NOTE — Op Note (Signed)
 CARDIOVASCULAR SURGERY OPERATIVE NOTE  [name] [name] [id number] 07/24/2023   Surgeon:  Dorise LOIS Fellers, MD  First Assistant: Lemond Cera, PA-C: An experienced assistant was required given the complexity of this surgery and the standard of surgical care. The assistant was needed for exposure, dissection, suctioning, retraction of delicate tissues and sutures, instrument exchange and for overall help during this procedure.   Preoperative Diagnosis: Class IV heart failure with LVEF 25%   Postoperative Diagnosis:  Same   Procedure  Median Sternotomy Extracorporeal circulation Removal of Impella 5.5  4.   Implantation of HeartMate 3 Left Ventricular Assist Device. 5.   Mitral valve replacement using a 33 mm Medtronic Mosaic Porcine valve.  Anesthesia:  General Endotracheal   Clinical History/Surgical Indication:  The patient is a 48 year old woman with a history of hypertension, OSA on CPAP, prior DVT and PE with a vena cava filter, nonobstructive coronary disease, nonischemic cardiomyopathy and severe mitral regurgitation who underwent MTEER on 07/09/2023 with severe residual mitral regurgitation and cardiogenic shock.  She was stabilized with an intra-aortic balloon pump and milrinone .  She underwent implantation of an Impella 5.5 via the left axillary artery on 07/10/2023 in an attempt to stabilize her further before consideration of permanent LVAD therapy.  Despite this she is continue to have severe pulmonary hypertension in the range of 80's/30's with elevated CVP.  Cardiac index was >2 with Co-ox in the 50's.  A 2D echocardiogram  showed left ventricular ejection fraction of 25% with global hypokinesis.  There was severe mitral regurgitation.  There was mild to moderate tricuspid regurgitation.  The right ventricle was mildly enlarged with moderate RV systolic dysfunction.   I think the only option for treating her at this time is HeartMate 3 LVAD implantation and mitral valve replacement  using a bioprosthetic valve.  This certainly has increased operative risk due to the complexity of the surgery and the need for concomitant mitral valve replacement.  I think her right ventricle has a good chance of recovery since she is able to maintain reasonable hemodynamics with wide open mitral regurgitation and she has been able to get up ambulating with PT today.  Hopefully her pulm artery pressures will drop significantly with mitral valve replacement.  She has no other endorgan failure at this time although her creatinine has been rising slowly over the past 3 days.  Her pulmonary function testing on 07/17/2023 showed a severely reduced FEV1 of 1.09 but I doubt this is reliable given the severity of her mitral regurgitation.  She has 1 tooth that oral surgery felt should be removed but the oral surgeon is not available for another week since he is on vacation and there is no backup.  There is no sign of dental abscess on orthopantogram and I think would be best to proceed with LVAD therapy and have that tooth extracted later.  The operative procedure has been discussed with the patient and family including alternatives, benefits and risks; including but not limited to bleeding, blood transfusion, infection, stroke, myocardial infarction, pump failure or thrombus possibly requiring replacement, RV dysfunction requiring an RVAD or long term milrinone  therapy, heart block requiring a permanent pacemaker, organ dysfunction, and death.  The patient understands and agrees to proceed.      Preparation:  The patient was seen in the preoperative holding area and the correct patient, correct operation were confirmed with the patient after reviewing the medical record and catheterization. The consent was signed by me. Preoperative antibiotics were given. A  pulmonary arterial line and radial arterial line were placed by the anesthesia team. The patient was taken back to the operating room and positioned supine on  the operating room table. After being placed under general endotracheal anesthesia by the anesthesia team a foley catheter was placed. The neck, chest, abdomen, and both legs were prepped with betadine soap and solution and draped in the usual sterile manner. A surgical time-out was taken and the correct patient and operative procedure were confirmed with the nursing and anesthesia staff.   Pre-bypass TEE:   Complete TEE assessment was performed by Dr. Dorethea. This showed severe LV systolic dysfunction with severe MR. Trivial AI around the Impella. RV systolic function moderately depressed with mild dilation. Mild TR. No PFO.  Median sternotomy:    A median sternotomy was performed. The pericardium was opened in the midline. The patient had acute/subacute pericarditis with obliteration of the pericardial free space and pericardium adherent to the heart with loose inflammatory adhesions. Right ventricular function appeared moderately depressed with mild distension. The ascending aorta was of small size and had no palpable plaque. There were no contraindications to aortic cannulation or cross-clamping. The pericardium was opened along the left diaphragm out to the apex.  Then a 4 cm transverse incision was made to the left of the umbilicus and continued down to the rectus fascia using electrocautery. A skin punch was used to create the drive line exit site about 3 cm below the right costal margin in the anterior axillary line. The  tunneling device was passed in a subcutaneous plane from the transverse abdominal incision to a point almost to the pericardium and then through the rectus fascia into the pre-peritoneal space. Care was taken to make sure that the peritoneum was separated from the posterior rectus fascia at this location and the entrance site of the trocar into the preperitoneal space was observed while passing the trocar to prevent intraabdominal injury. The other tunneling device was passed  from the abdominal incision the the skin exit site.  Cardiopulmonary Bypass:   The patient was fully systemically heparinized and the ACT was maintained > 400 sec. The proximal aortic arch was cannulated with a 20 F aortic cannula for arterial inflow. Bi-caval venous cannulation was performed using a 28 F metal tip right angle cannula in the SVC and a 36 F malleable plastic cannula in the IVC. A cardioplegia/vent cannula was placed in the proximal ascending aorta. Carbon dioxide was insufflated into the pericardium at 6L/min throughout the procedure to minimize intracardiac air. The systemic temperature was allowed to drift downward to 32 degrees Centigrade during the procedure and the patient was actively rewarmed.  Removal of Impella 5.5:  After going on pump the Impella incision was opened and the graft mobilized from the surrounding tissue. The sutures were removed and the Impella was removed as the graft was clamped. The graft was divided to a level below the pectoralis muscle. The graft was occluded with a #1 Silk tie and then suture ligated with a 3-0 Prolene pledgetted horizontal mattress suture.   Insertion of apical outflow cannula: Assisted by Lemond Cera, PA-C  The patient was placed on cardiopulmonary bypass. Laparotomy pads were placed behind the heart to aid exposure of the apex. A site was chosen on the anterior wall lateral to the LAD and superior to the true apex. A stab incision was made and a foley catheter placed through the coring knife and into the left ventricle. The balloon was inflated and with  gentle traction on the catheter the coring knife was carefully advanced toward the mitral valve to create the ventriculotomy. The balloon was deflated and removed. A sump was placed into the LV and the ventricular core excised and sent to pathology. Trabeculations that might cause obstruction were excised. Then a series of 2-0 Ethibond horizontal mattress sutures were placed around the  ventriculotomy. This took 14 sutures. The sutures were placed through the apical cuff. The sutures were tied and the apical cuff appeared to be well-sealed to the apex. The HeartMate 3 was brought onto the operative field. The inflow cannula was inserted into the apical cuff and the apical cuff lock engaged. The heart was returned to the pericardium.   Mitral Valve Replacement: Assisted by Lemond Cera, PA-C  The left atrium was opened through a vertical incision in the interatrial groove. Exposure was good. Valve inspection showed thin leaflets with a tear in the anterior leaflet adjacent to the Mitraclip with a flail anterior leaflet. The posterior leaflet was very restricted.   The anterior leaflet and part of the posterior leaflet was excised along with the clip.   A series of pledgetted 2-0 Ethibond sutures were placed around the mitral annulus. A 33 mm Medtronic Mosaic porcine valve was chosen ( model 310, [place] [id number]). The sutures were placed through the sewing ring and it was lowered into place. The sutures were tied using CorKnots. The valve was tested with saline and there was no regurgitation. The atrium was closed with 2 layers of continuous 3-0 prolene suture.     Aortic outflow graft anastomosis: Assisted by Lemond Cera, PA-C  The drive line was then tunneled using the previously placed tunneling devices. The black line on the graft was oriented along the margin of the pericardium to prevent twisting of the graft. The bend relief was fully engaged and tested with an axial pull.  The outflow graft was distended and positioned along the inferior margin of the heart and around the right atrium. The outflow graft was cut to the appropriate length. The ascending aorta was partially occluded with a Lemole clamp. A slightly diagonal aortotomy was created and the ends enlarged with a 4.5 mm punch. The outflow graft was anastomosed to the aorta using 4-0 prolene pledgetted sutures at the toe and heal  that were run down each side. [place] was used to seal the needle holes in the graft. The Lemole clamp was removed and the graft clamped with a peripheral Debakey clamp and deaired using a 21 gauge needle. The aortic anastomosis was hemostatic.   Weaning from bypass:  The patient was started on Milrinone  0.25 mcg/kg/min, epinephrine  4 mcg/kg/min, levophed  at 4 mcg/kg/min and nitric oxide at 30 ppm. The HeartMate 3 pump was started at 3000 rpm. TEE confirmed that the intracardiac air was removed.  Cardiopulmonary bypass flow was decreased to half flow and the clamp removed from the outflow graft. The speed was gradually increased to 4000 rpm. Flow was decreased to 1/4 and the speed gradually increased to 4800 rpm. Cardiopulmonary bypass was discontinued and the speed increased to 5400 rpm.  Pulmonary artery pressures were much improved at 50's/20's with a CVP of 8. CO was 5 L/min. XC time was 79 minutes. CPB time was 227 minutes. TEE showed excellent apical cannula position. There was no MR, trivial TR. RV function appeared improved.   Completion:  Two temporary epicardial pacing wires were placed on the right ventricle. Heparin  was fully reversed with protamine  and the aortic and  venous cannulas removed. Hemostasis was achieved. Mediastinal drainage tubes and a bilateral pleural chest tubes were placed.  A 19 Fr Blake pocket drain was placed. The sternum was closed with double #6 stainless steel wires. The fascia was closed with continuous # 1 vicryl suture. The subcutaneous tissue was closed with 2-0 vicryl continuous suture. The skin was closed with 3-0 vicryl subcuticular suture. The abdominal incision was closed with continuous 3-0 vicryl subcutaneous suture and 3-0 vicryl subcuticular skin suture. The drive line exit site was re-approximated  3-0 nylon skin sutures. The two drive line fasteners were applied. The Impella incision was closed with 2-0 Vicryl suture to approximate the pectoralis muscle,  3-0 Vicryl for the subcutaneous tissue and 3-0 vicryl subcuticular skin suture. All sponge, needle, and instrument counts were reported correct at the end of the case. Dry sterile dressings were placed over the incisions and around the chest tubes which were connected to pleurevac suction. The patient was then transported to the surgical intensive care unit in critical but stable condition.

## 2023-07-24 NOTE — Anesthesia Procedure Notes (Addendum)
 Central Venous Catheter Insertion Performed by: Dorethea Cordella SQUIBB, DO, anesthesiologist Start/End7/03/2023 7:30 AM, 07/24/2023 7:45 AM Patient location: OR. Preanesthetic checklist: patient identified, IV checked, site marked, risks and benefits discussed, surgical consent, monitors and equipment checked, pre-op evaluation, timeout performed and anesthesia consent Position: Trendelenburg Lidocaine  1% used for infiltration and patient sedated Hand hygiene performed  and maximum sterile barriers used  Catheter size: 9 Fr Central line was placed.MAC introducer Procedure performed using ultrasound guided technique. Ultrasound Notes:anatomy identified, needle tip was noted to be adjacent to the nerve/plexus identified, no ultrasound evidence of intravascular and/or intraneural injection and image(s) printed for medical record Attempts: 1 Following insertion, line sutured, dressing applied and Biopatch. Post procedure assessment: blood return through all ports, free fluid flow and no air  Patient tolerated the procedure well with no immediate complications.

## 2023-07-24 NOTE — Progress Notes (Addendum)
 Patient ID: [name] [name], female   DOB: Oct 27, 1975, 48 y.o.   MRN: [id number]     Advanced Heart Failure Rounding Note  Cardiologist: Ria Commander, DO  Chief Complaint: Cardiogenic Shock Subjective:    6/17: mTEER with sev posterior leaflet restriction resulting in tear. IABP + PAC. PCWP 40, v up to 60 6/24 : Aggressive diuresed. Started on lasix  drip. Brisk diuresis noted.  6/30: Echo with EF 25-30%, RV moderately dysfunctional/mildly dilated, Mitraclip present with severe MR, mild-moderate TR, dilated IVC.  07/24/23: s/p HM III LVAD and MVR  Just returned from LVAD and MVR. Impella removed intra-op. Currently on Epi @ 5, NE @2  and milrinone  @ .25. On nitric @ 30.   Received 2 FFP, 1 plt, 2 cryo, 710 cc, and 20 DDAVP intra-op.   VAD MAPs stable 70s-90s.   Swan numbers: RA 8/9 PA 40/18 (24) CO/CI currently not reading Co-ox 50%  LVAD Interrogation HM III: Speed: 5700 Flow: 4.3 PI: 2.3 Power: 4.3.    Objective:    Weight Range: (P) 110.5 kg Body mass index is 34.95 kg/m (pended).   Vital Signs:   Temp:  [97.8 F (36.6 C)-98.5 F (36.9 C)] (P) 97.9 F (36.6 C) (07/03 0611) Pulse Rate:  [76-144] (P) 112 (07/03 0611) Resp:  [13-27] (P) 16 (07/03 0611) BP: (86-102)/(47-87) (P) 90/47 (07/03 0611) SpO2:  [88 %-100 %] (P) 94 % (07/03 0611) FiO2 (%):  [21 %] 21 % (07/03 0110) Weight:  [110.5 kg] (P) 110.5 kg (07/03 0611) Last BM Date : 07/19/23  Weight change: Filed Weights   07/23/23 0500 07/24/23 0608 07/24/23 0611  Weight: 110.7 kg 110.5 kg (P) 110.5 kg   Intake/Output:  Intake/Output Summary (Last 24 hours) at 07/24/2023 1536 Last data filed at 07/24/2023 1430 Gross per 24 hour  Intake 4851.02 ml  Output 3385 ml  Net 1466.02 ml   Physical Exam   Vitals:   07/24/23 0602 07/24/23 0611  BP: (!) 90/47 (!) (P) 90/47  Pulse:  (!) (P) 112  Resp: 16 (P) 16  Temp:  (P) 97.9 F (36.6 C)  SpO2:  (P) 94%   General:  acutely ill appearing.  HEENT: +ETT, +  OG Neck:  JVP UTA. [name] [name], CVC [facility]: Mechanical heart sounds with LVAD hum present. +CT Lungs: Clear Driveline: C/D/I; securement device intact and driveline incorporated GU: +foley Extremities: no cyanosis, clubbing, rash, non-pitting BLE edema. Brachial a line,  Neuro: intubated and sedated  Telemetry  NSR 80s (Personally reviewed)    Labs  CBC Recent Labs    07/23/23 0417 07/23/23 0627 07/24/23 0405 07/24/23 0812 07/24/23 1223 07/24/23 1227 07/24/23 1337 07/24/23 1342  WBC 9.3  --  7.1  --   --   --   --   --   HGB 9.2*   < > 8.6*   < > 6.1*   < > 9.2* 10.2*  HCT 30.5*   < > 28.4*   < > 19.9*   < > 27.0* 30.0*  MCV 89.4  --  89.0  --   --   --   --   --   PLT 120*  --  120*  --  99*  --   --   --    < > = values in this interval not displayed.   Basic Metabolic Panel Recent Labs    92/97/74 0417 07/23/23 0627 07/24/23 0405 07/24/23 0812 07/24/23 1227 07/24/23 1337 07/24/23 1342  NA 131*   < >  128*   < > 133* 135 136  K 4.5   < > 4.0   < > 4.1 3.5 3.5  CL 94*  --  91*   < > 94* 95*  --   CO2 28  --  29  --   --   --   --   GLUCOSE 107*  --  104*   < > 165* 104*  --   BUN 25*  --  28*   < > 29* 29*  --   CREATININE 1.46*  --  1.58*   < > 1.50* 1.40*  --   CALCIUM  9.4  --  9.2  --   --   --   --   MG 1.8  --  1.9  --   --   --   --    < > = values in this interval not displayed.   Liver Function Tests Recent Labs    07/23/23 0432  AST 33  ALT 14  ALKPHOS 74  BILITOT 1.2  PROT 7.6  ALBUMIN  2.7*   BNP (last 3 results) Recent Labs    05/28/23 1224 06/30/23 1218 07/04/23 1057  BNP 1,155.3* 600.6* 612.0*   Fasting Lipid Panel No results for input(s): CHOL, HDL, LDLCALC, TRIG, CHOLHDL, LDLDIRECT in the last 72 hours.  Medications:   Scheduled Medications:  sodium chloride    Intravenous Once   sodium chloride    Intravenous Once   sodium chloride    Intravenous Once   acetaminophen   500 mg Oral Q6H   amiodarone   400 mg Oral BID    Chlorhexidine  Gluconate Cloth  6 each Topical Daily   feeding supplement  237 mL Oral TID BM   fluticasone   2 spray Each Nare Daily   hydrALAZINE   10 mg Oral Q8H   insulin  aspart  0-9 Units Subcutaneous Q4H   loratadine   10 mg Oral Daily   multivitamin with minerals  1 tablet Oral Daily   pantoprazole   40 mg Oral Daily   polyethylene glycol  17 g Oral BID   senna-docusate  2 tablet Oral BID   spironolactone   25 mg Oral Daily   Infusions:  furosemide  Stopped (07/23/23 1918)   heparin  Stopped (07/24/23 9375)   milrinone  0.25 mcg/kg/min (07/24/23 0700)   sodium bicarbonate  25 mEq (Impella PURGE) in dextrose  5 % 1000 mL bag     PRN Medications: sodium chloride , acetaminophen , albuterol , ALPRAZolam , morphine  injection, ondansetron  (ZOFRAN ) IV, mouth rinse, oxyCODONE , oxyCODONE , sodium chloride   Patient Profile   48 y.o. female with history of chronic systolic HF with biventricular dysfunction on home inotrope, severe MR, hx PE/DVT, obesity, CAD, anemia, tobacco use. Presented for mTEER on 07/09/23. Worsening MR leading to cardiogenic shock. S/p LVAD 07/24/23.   Assessment/Plan  Cardiogenic shock>>>now S/P HM III LVAD 07/24/23: due to torrential MR s/p mTEER complicated by severely restricted posterior leaflet that was damaged during repair attempt, IABP and nipride  needed to stabilize. Transitioned to Impella 5.5. Not a transplant candidate with tobacco use.   - Limited echo 7/1: LV systolic function did look improved from prior in 30-35% range, there is thinning/akinesis of the inferolateral wall, mild-moderate RV dysfunction, severe MR, dilated IVC.   - Reviewing prior echoes, the inferolateral wall has been akinetic long-term.  Cath in 5/23 showed mild nonobstructive CAD. Patient has never had cardiac MRI.  - CVP initially high intra-op. Down to 8/9 at the end of case. Plan for diuresis tomorrow.  - LVAD parameters stable on interrogation.  Speed up to 5700. Follow LDH.  - TD CO/CI  pending.  Continue milrinone  0.25 mcg/kg/min.  - Currently off hydralazine . On Epi @5 , NE @2 .  Wean as able.  - PFTs with severely reduced volumes. I feel this is out of proportion to her clinically. I suspect this may be due to poor effort and weakness. In addition, CT chest does not demonstrating significant parenchymal lung disease.    Acute on chronic systolic HF with biventricular dysfunction - stage D cardiomyopathy - nonobstructive CAD on cath 2023.  - RHC 5/25 on milrinone  0.25: severe pulmonary venous HTN, severely elevated PCWP with prominent v waves, preserved CO - Continue milrinone  0.89mcg/kg/min; mixed venous 50% from this morning.    Torrential MR - See above - Improved with unloading, extubated 6/20. Now reintubated for LVAD  - Now s/p MVR during LVAD.  - Intra-op TEE, read pending. Limited echo PTD   Acute hypoxic respiratory failure - In setting of acute on chronic CHF and severe/torrential  - Intubated for LVAD. - PCCM following   Hx of PE/DVT w/ IVC filter - Evaluated at [facility] where thrombophilia work-up was negative (Lupus inhibitor, anticardiolipin antibodies, and anti beta-2  glycoprotein antibodies were negative; protein C activity 85%, protein S activity 76%, factor V Leiden, and prothrombin gene mutation ) - IVC filter in place; could not be removed due to significant thrombus burden on filter at [facility]  - hep gtt eventually   CAD - LHC in 5/23 w/ nonobstructive CAD involving RCA - No aspirin  with need for anticoagulation.  Tobacco use - Smoking 3 cigarettes/day at time of last follow-up.  Anemia  - Hgb stable - Transfuse for HGB < 8. No obvious source.   Headache, Severe - CT 7/1 (-) - UTA today  PVCs - Continue PO amio.   AKI - Creatinine 1.4, follow closely.   Length of Stay: 15  Beckey LITTIE Coe, NP  07/24/2023, 3:36 PM  Advanced Heart Failure Team Pager [phone] (M-F; 7a - 5p)  Please contact [facility] for night-coverage after hours (5p  -7a ) and weekends on [email or web address]  CRITICAL CARE Performed by: Beckey LITTIE Coe  Total critical care time: 18 minutes  Critical care time was exclusive of separately billable procedures and treating other patients.  Critical care was necessary to treat or prevent imminent or life-threatening deterioration.  Critical care was time spent personally by me on the following activities: development of treatment plan with patient and/or surrogate as well as nursing, discussions with consultants, evaluation of patient's response to treatment, examination of patient, obtaining history from patient or surrogate, ordering and performing treatments and interventions, ordering and review of laboratory studies, ordering and review of radiographic studies, pulse oximetry and re-evaluation of patient's condition.   Patient seen with NP, I formulated the plan and agree with the above note.    Patient is now s/p bioprosthetic mitral valve + HM3 LVAD placement today.  PA pressure significantly lower now, 43/22 with CI 2.86.  She is on NO 30 ppm, epi 2, NE 2, vasopressin 0.03, milrinone  0.25.   General: Intubated/sedated.  HEENT: Normal. Neck: Supple, JVP 7-8 cm. Carotids OK.  Cardiac:  Mechanical heart sounds with LVAD hum present.  Lungs:  CTAB, normal effort.  Abdomen:  NT, ND, no HSM. No bruits or masses. +BS  LVAD exit site: Well-healed and incorporated. Dressing dry and intact. No erythema or drainage. Stabilization device present and accurately applied. Driveline dressing changed daily per sterile technique. Extremities:  Warm and  dry. No cyanosis, clubbing, rash, or edema.  Neuro: Sedated on vent.   Weaning down on pressors.  Would keep milrinone  and NO the same overnight.  Significant improvement in PA pressure, good cardiac output.  Stable LVAD parameters at 5700 rpm.   Will likely start diuresis tomorrow.   CRITICAL CARE Performed by: Ezra Shuck  Total critical care time: 40 minutes  Critical  care time was exclusive of separately billable procedures and treating other patients.  Critical care was necessary to treat or prevent imminent or life-threatening deterioration.  Critical care was time spent personally by me on the following activities: development of treatment plan with patient and/or surrogate as well as nursing, discussions with consultants, evaluation of patient's response to treatment, examination of patient, obtaining history from patient or surrogate, ordering and performing treatments and interventions, ordering and review of laboratory studies, ordering and review of radiographic studies, pulse oximetry and re-evaluation of patient's condition.  Ezra Shuck 07/24/2023 4:38 PM

## 2023-07-24 NOTE — Brief Op Note (Signed)
 07/09/2023 - 07/24/2023  7:22 AM  PATIENT:  [name] [name]  48 y.o. female  PRE-OPERATIVE DIAGNOSIS:  End stage heart failure, severe Mitral Regurgitation  POST-OPERATIVE DIAGNOSIS:  End stage heart failure, severe Mitral Regurgitation  PROCEDURE:  Procedure(s): INSERTION OF IMPLANTABLE LEFT VENTRICULAR ASSIST DEVICE USING HEARTMATE 3 (N/A) MITRAL VALVE REPLACEMENT USING MEDTRONIC MOSAIC PORCINE BIOPROSTHESIS MITRAL VALVE SIZE (N/A) ECHOCARDIOGRAM, TRANSESOPHAGEAL (N/A) REMOVAL, CARDIAC ASSIST DEVICE, IMPELLA 5.5  SURGEON:  Surgeons and Role:    * Bartle, Dorise POUR, MD - Primary  PHYSICIAN ASSISTANT: Maleke Feria PA-C  ASSISTANTS: kRISTINA PRICE RNFA   ANESTHESIA:   general  EBL:  850 mL   BLOOD ADMINISTERED:PRBC'S, CRYO, FFP, PLATELETS  DRAINS: MEDIASTINAL CHEST TUBES   LOCAL MEDICATIONS USED:  NONE  SPECIMEN:  Source of Specimen:  MITRAL LEAFLET, MITRACLIP  DISPOSITION OF SPECIMEN:  PATHOLOGY  COUNTS:  YES  TOURNIQUET:  * No tourniquets in log *  DICTATION: .Dragon Dictation  PLAN OF CARE: Admit to inpatient   PATIENT DISPOSITION:  ICU - intubated and hemodynamically stable.   Delay start of Pharmacological VTE agent (>24hrs) due to surgical blood loss or risk of bleeding: yes  COMPLICATIONS: NO KNOWN

## 2023-07-24 NOTE — Progress Notes (Signed)
   07/24/23 0110  BiPAP/CPAP/SIPAP  $ Non-Invasive Home Ventilator  Subsequent  BiPAP/CPAP/SIPAP Pt Type Adult  BiPAP/CPAP/SIPAP Resmed  Mask Type Full face mask  Dentures removed? Not applicable  Mask Size Medium  Respiratory Rate 20 breaths/min  PEEP 11 cmH20  FiO2 (%) 21 %  Patient Home Machine No  Patient Home Mask No  Patient Home Tubing No  Auto Titrate No  CPAP/SIPAP surface wiped down Yes  Device Plugged into RED Power Outlet Yes  BiPAP/CPAP /SiPAP Vitals  Pulse Rate 76  Resp 20  Bilateral Breath Sounds Clear;Diminished

## 2023-07-25 ENCOUNTER — Inpatient Hospital Stay (HOSPITAL_COMMUNITY)

## 2023-07-25 ENCOUNTER — Encounter (HOSPITAL_COMMUNITY): Payer: Self-pay | Admitting: Surgery

## 2023-07-25 DIAGNOSIS — Z952 Presence of prosthetic heart valve: Secondary | ICD-10-CM

## 2023-07-25 DIAGNOSIS — J9601 Acute respiratory failure with hypoxia: Secondary | ICD-10-CM | POA: Diagnosis not present

## 2023-07-25 DIAGNOSIS — I5023 Acute on chronic systolic (congestive) heart failure: Secondary | ICD-10-CM | POA: Diagnosis not present

## 2023-07-25 DIAGNOSIS — I5043 Acute on chronic combined systolic (congestive) and diastolic (congestive) heart failure: Secondary | ICD-10-CM | POA: Diagnosis not present

## 2023-07-25 DIAGNOSIS — R570 Cardiogenic shock: Secondary | ICD-10-CM | POA: Diagnosis not present

## 2023-07-25 DIAGNOSIS — Z9889 Other specified postprocedural states: Secondary | ICD-10-CM

## 2023-07-25 DIAGNOSIS — I340 Nonrheumatic mitral (valve) insufficiency: Secondary | ICD-10-CM | POA: Diagnosis not present

## 2023-07-25 LAB — BASIC METABOLIC PANEL WITH GFR
Anion gap: 12 (ref 5–15)
Anion gap: 10 (ref 5–15)
BUN: 24 mg/dL — ABNORMAL HIGH (ref 6–20)
BUN: 24 mg/dL — ABNORMAL HIGH (ref 6–20)
CO2: 24 mmol/L (ref 22–32)
CO2: 24 mmol/L (ref 22–32)
Calcium: 8.8 mg/dL — ABNORMAL LOW (ref 8.9–10.3)
Calcium: 8.8 mg/dL — ABNORMAL LOW (ref 8.9–10.3)
Chloride: 98 mmol/L (ref 98–111)
Chloride: 99 mmol/L (ref 98–111)
Creatinine, Ser: 1.09 mg/dL — ABNORMAL HIGH (ref 0.44–1.00)
Creatinine, Ser: 1.22 mg/dL — ABNORMAL HIGH (ref 0.44–1.00)
GFR, Estimated: >60 mL/min (ref >60)
GFR, Estimated: 55 mL/min — ABNORMAL LOW (ref >60)
Glucose, Bld: 149 mg/dL — ABNORMAL HIGH (ref 70–99)
Glucose, Bld: 142 mg/dL — ABNORMAL HIGH (ref 70–99)
Potassium: 3.9 mmol/L (ref 3.5–5.1)
Potassium: 3.7 mmol/L (ref 3.5–5.1)
Sodium: 134 mmol/L — ABNORMAL LOW (ref 135–145)
Sodium: 133 mmol/L — ABNORMAL LOW (ref 135–145)

## 2023-07-25 LAB — COOXEMETRY PANEL
Carboxyhemoglobin: 1.8 % — ABNORMAL HIGH (ref 0.5–1.5)
Methemoglobin: <0.7 % (ref 0.0–1.5)
O2 Saturation: 65.9 %
Total hemoglobin: 7.9 g/dL — ABNORMAL LOW (ref 12.0–16.0)

## 2023-07-25 LAB — BPAM FFP
Blood Product Expiration Date: 202507042359
Blood Product Expiration Date: 202507042359
Blood Product Expiration Date: 202507042359
Blood Product Expiration Date: 202507042359
ISSUE DATE / TIME: 202507030727
ISSUE DATE / TIME: 202507030727
ISSUE DATE / TIME: 202507030727
ISSUE DATE / TIME: 202507030727
Unit Type and Rh: 600
Unit Type and Rh: 600
Unit Type and Rh: 6200
Unit Type and Rh: 6200

## 2023-07-25 LAB — POCT I-STAT 7, (LYTES, BLD GAS, ICA,H+H)
Acid-Base Excess: 3 mmol/L — ABNORMAL HIGH (ref 0.0–2.0)
Acid-Base Excess: 2 mmol/L (ref 0.0–2.0)
Acid-Base Excess: 1 mmol/L (ref 0.0–2.0)
Acid-Base Excess: 1 mmol/L (ref 0.0–2.0)
Bicarbonate: 26.4 mmol/L (ref 20.0–28.0)
Bicarbonate: 26.7 mmol/L (ref 20.0–28.0)
Bicarbonate: 25.8 mmol/L (ref 20.0–28.0)
Bicarbonate: 25.2 mmol/L (ref 20.0–28.0)
Calcium, Ion: 1.18 mmol/L (ref 1.15–1.40)
Calcium, Ion: 1.18 mmol/L (ref 1.15–1.40)
Calcium, Ion: 1.22 mmol/L (ref 1.15–1.40)
Calcium, Ion: 1.19 mmol/L (ref 1.15–1.40)
HCT: 22 % — ABNORMAL LOW (ref 36.0–46.0)
HCT: 21 % — ABNORMAL LOW (ref 36.0–46.0)
HCT: 23 % — ABNORMAL LOW (ref 36.0–46.0)
HCT: 23 % — ABNORMAL LOW (ref 36.0–46.0)
Hemoglobin: 7.5 g/dL — ABNORMAL LOW (ref 12.0–15.0)
Hemoglobin: 7.1 g/dL — ABNORMAL LOW (ref 12.0–15.0)
Hemoglobin: 7.8 g/dL — ABNORMAL LOW (ref 12.0–15.0)
Hemoglobin: 7.8 g/dL — ABNORMAL LOW (ref 12.0–15.0)
O2 Saturation: 98 %
O2 Saturation: 98 %
O2 Saturation: 94 %
O2 Saturation: 94 %
Patient temperature: 36.2
Patient temperature: 36
Patient temperature: 36.2
Patient temperature: 36.5
Potassium: 3.8 mmol/L (ref 3.5–5.1)
Potassium: 4.1 mmol/L (ref 3.5–5.1)
Potassium: 4 mmol/L (ref 3.5–5.1)
Potassium: 3.7 mmol/L (ref 3.5–5.1)
Sodium: 134 mmol/L — ABNORMAL LOW (ref 135–145)
Sodium: 135 mmol/L (ref 135–145)
Sodium: 134 mmol/L — ABNORMAL LOW (ref 135–145)
Sodium: 133 mmol/L — ABNORMAL LOW (ref 135–145)
TCO2: 27 mmol/L (ref 22–32)
TCO2: 28 mmol/L (ref 22–32)
TCO2: 27 mmol/L (ref 22–32)
TCO2: 26 mmol/L (ref 22–32)
pCO2 arterial: 32 mmHg (ref 32–48)
pCO2 arterial: 37.6 mmHg (ref 32–48)
pCO2 arterial: 39.7 mmHg (ref 32–48)
pCO2 arterial: 38.8 mmHg (ref 32–48)
pH, Arterial: 7.522 — ABNORMAL HIGH (ref 7.35–7.45)
pH, Arterial: 7.456 — ABNORMAL HIGH (ref 7.35–7.45)
pH, Arterial: 7.417 (ref 7.35–7.45)
pH, Arterial: 7.418 (ref 7.35–7.45)
pO2, Arterial: 92 mmHg (ref 83–108)
pO2, Arterial: 95 mmHg (ref 83–108)
pO2, Arterial: 67 mmHg — ABNORMAL LOW (ref 83–108)
pO2, Arterial: 68 mmHg — ABNORMAL LOW (ref 83–108)

## 2023-07-25 LAB — PREPARE FRESH FROZEN PLASMA
Unit division: 0
Unit division: 0

## 2023-07-25 LAB — CBC WITH DIFFERENTIAL/PLATELET
Abs Immature Granulocytes: 0.15 K/uL — ABNORMAL HIGH (ref 0.00–0.07)
Basophils Absolute: 0.1 K/uL (ref 0.0–0.1)
Basophils Relative: 1 %
Eosinophils Absolute: 0 K/uL (ref 0.0–0.5)
Eosinophils Relative: 0 %
HCT: 22.7 % — ABNORMAL LOW (ref 36.0–46.0)
Hemoglobin: 7.5 g/dL — ABNORMAL LOW (ref 12.0–15.0)
Immature Granulocytes: 1 %
Lymphocytes Relative: 2 %
Lymphs Abs: 0.5 K/uL — ABNORMAL LOW (ref 0.7–4.0)
MCH: 28.7 pg (ref 26.0–34.0)
MCHC: 33 g/dL (ref 30.0–36.0)
MCV: 87 fL (ref 80.0–100.0)
Monocytes Absolute: 1.3 K/uL — ABNORMAL HIGH (ref 0.1–1.0)
Monocytes Relative: 6 %
Neutro Abs: 18.9 K/uL — ABNORMAL HIGH (ref 1.7–7.7)
Neutrophils Relative %: 90 %
Platelets: 105 K/uL — ABNORMAL LOW (ref 150–400)
RBC: 2.61 MIL/uL — ABNORMAL LOW (ref 3.87–5.11)
RDW: 19.7 % — ABNORMAL HIGH (ref 11.5–15.5)
WBC: 21 K/uL — ABNORMAL HIGH (ref 4.0–10.5)
nRBC: 0 % (ref 0.0–0.2)

## 2023-07-25 LAB — GLUCOSE, CAPILLARY
Glucose-Capillary: 120 mg/dL — ABNORMAL HIGH (ref 70–99)
Glucose-Capillary: 128 mg/dL — ABNORMAL HIGH (ref 70–99)
Glucose-Capillary: 134 mg/dL — ABNORMAL HIGH (ref 70–99)
Glucose-Capillary: 135 mg/dL — ABNORMAL HIGH (ref 70–99)
Glucose-Capillary: 135 mg/dL — ABNORMAL HIGH (ref 70–99)
Glucose-Capillary: 136 mg/dL — ABNORMAL HIGH (ref 70–99)
Glucose-Capillary: 137 mg/dL — ABNORMAL HIGH (ref 70–99)
Glucose-Capillary: 145 mg/dL — ABNORMAL HIGH (ref 70–99)
Glucose-Capillary: 147 mg/dL — ABNORMAL HIGH (ref 70–99)
Glucose-Capillary: 148 mg/dL — ABNORMAL HIGH (ref 70–99)
Glucose-Capillary: 154 mg/dL — ABNORMAL HIGH (ref 70–99)

## 2023-07-25 LAB — BPAM CRYOPRECIPITATE
Blood Product Expiration Date: 202507042359
Blood Product Expiration Date: 202507031915
ISSUE DATE / TIME: 202507031306
ISSUE DATE / TIME: 202507031351
Unit Type and Rh: 5100
Unit Type and Rh: 5100

## 2023-07-25 LAB — COMPREHENSIVE METABOLIC PANEL WITH GFR
ALT: 22 U/L (ref 0–44)
AST: 91 U/L — ABNORMAL HIGH (ref 15–41)
Albumin: 2.6 g/dL — ABNORMAL LOW (ref 3.5–5.0)
Alkaline Phosphatase: 50 U/L (ref 38–126)
Anion gap: 8 (ref 5–15)
BUN: 24 mg/dL — ABNORMAL HIGH (ref 6–20)
CO2: 26 mmol/L (ref 22–32)
Calcium: 8.8 mg/dL — ABNORMAL LOW (ref 8.9–10.3)
Chloride: 99 mmol/L (ref 98–111)
Creatinine, Ser: 1.15 mg/dL — ABNORMAL HIGH (ref 0.44–1.00)
GFR, Estimated: 59 mL/min — ABNORMAL LOW (ref >60)
Glucose, Bld: 122 mg/dL — ABNORMAL HIGH (ref 70–99)
Potassium: 3.7 mmol/L (ref 3.5–5.1)
Sodium: 133 mmol/L — ABNORMAL LOW (ref 135–145)
Total Bilirubin: 2.2 mg/dL — ABNORMAL HIGH (ref 0.0–1.2)
Total Protein: 5.8 g/dL — ABNORMAL LOW (ref 6.5–8.1)

## 2023-07-25 LAB — PREPARE PLATELET PHERESIS: Unit division: 0

## 2023-07-25 LAB — BPAM PLATELET PHERESIS
Blood Product Expiration Date: 202507042359
ISSUE DATE / TIME: 202507031306
Unit Type and Rh: 6200

## 2023-07-25 LAB — PREPARE CRYOPRECIPITATE
Unit division: 0
Unit division: 0

## 2023-07-25 LAB — CBC
HCT: 23.6 % — ABNORMAL LOW (ref 36.0–46.0)
HCT: 24.5 % — ABNORMAL LOW (ref 36.0–46.0)
Hemoglobin: 7.7 g/dL — ABNORMAL LOW (ref 12.0–15.0)
Hemoglobin: 7.9 g/dL — ABNORMAL LOW (ref 12.0–15.0)
MCH: 28.1 pg (ref 26.0–34.0)
MCH: 27.7 pg (ref 26.0–34.0)
MCHC: 32.6 g/dL (ref 30.0–36.0)
MCHC: 32.2 g/dL (ref 30.0–36.0)
MCV: 86.1 fL (ref 80.0–100.0)
MCV: 86 fL (ref 80.0–100.0)
Platelets: 111 K/uL — ABNORMAL LOW (ref 150–400)
Platelets: 128 K/uL — ABNORMAL LOW (ref 150–400)
RBC: 2.74 MIL/uL — ABNORMAL LOW (ref 3.87–5.11)
RBC: 2.85 MIL/uL — ABNORMAL LOW (ref 3.87–5.11)
RDW: 21 % — ABNORMAL HIGH (ref 11.5–15.5)
RDW: 21.2 % — ABNORMAL HIGH (ref 11.5–15.5)
WBC: 18.1 K/uL — ABNORMAL HIGH (ref 4.0–10.5)
WBC: 18.2 K/uL — ABNORMAL HIGH (ref 4.0–10.5)
nRBC: 0 % (ref 0.0–0.2)
nRBC: 0 % (ref 0.0–0.2)

## 2023-07-25 LAB — PHOSPHORUS: Phosphorus: 4.2 mg/dL (ref 2.5–4.6)

## 2023-07-25 LAB — MAGNESIUM
Magnesium: 2.2 mg/dL (ref 1.7–2.4)
Magnesium: 2 mg/dL (ref 1.7–2.4)

## 2023-07-25 LAB — PREPARE RBC (CROSSMATCH)

## 2023-07-25 LAB — LACTATE DEHYDROGENASE: LDH: 476 U/L — ABNORMAL HIGH (ref 98–192)

## 2023-07-25 LAB — PROTIME-INR
INR: 1.4 — ABNORMAL HIGH (ref 0.8–1.2)
Prothrombin Time: 17.6 s — ABNORMAL HIGH (ref 11.4–15.2)

## 2023-07-25 LAB — BRAIN NATRIURETIC PEPTIDE: B Natriuretic Peptide: 938.1 pg/mL — ABNORMAL HIGH (ref 0.0–100.0)

## 2023-07-25 MED ORDER — INSULIN ASPART 100 UNIT/ML IJ SOLN
0.0000 [IU] | INTRAMUSCULAR | Status: DC
  Administered 2023-07-25 – 2023-07-26 (×7): 2 [IU] via SUBCUTANEOUS

## 2023-07-25 MED ORDER — SODIUM CHLORIDE 0.9% IV SOLUTION: Freq: Once | INTRAVENOUS | Status: AC

## 2023-07-25 MED ORDER — SODIUM CHLORIDE 0.9% IV SOLUTION
Freq: Once | INTRAVENOUS | Status: AC
Start: 2023-07-25 — End: 2023-07-26

## 2023-07-25 MED ORDER — POTASSIUM CHLORIDE 10 MEQ/50ML IV SOLN
10.0000 meq | INTRAVENOUS | Status: AC
  Administered 2023-07-25 (×3): 10 meq via INTRAVENOUS
  Filled 2023-07-25: qty 50

## 2023-07-25 MED ORDER — FUROSEMIDE 10 MG/ML IJ SOLN
40.0000 mg | Freq: Two times a day (BID) | INTRAMUSCULAR | Status: AC
  Administered 2023-07-25 (×2): 40 mg via INTRAVENOUS
  Filled 2023-07-25 (×2): qty 4

## 2023-07-25 MED ORDER — AMIODARONE HCL 200 MG PO TABS
200.0000 mg | ORAL_TABLET | Freq: Two times a day (BID) | ORAL | Status: DC
  Administered 2023-07-25 – 2023-08-04 (×22): 200 mg via ORAL
  Filled 2023-07-25 (×22): qty 1

## 2023-07-25 MED ORDER — ENSURE PLUS HIGH PROTEIN PO LIQD
237.0000 mL | Freq: Three times a day (TID) | ORAL | Status: DC
  Administered 2023-07-26 – 2023-07-28 (×4): 237 mL via ORAL

## 2023-07-25 MED ORDER — POTASSIUM CHLORIDE 10 MEQ/50ML IV SOLN
10.0000 meq | INTRAVENOUS | Status: AC
  Administered 2023-07-25 (×2): 10 meq via INTRAVENOUS
  Filled 2023-07-25: qty 50

## 2023-07-25 MED ORDER — INSULIN ASPART 100 UNIT/ML IJ SOLN: 0.0000 [IU] | INTRAMUSCULAR | Status: DC

## 2023-07-25 MED ORDER — ENOXAPARIN SODIUM 40 MG/0.4ML IJ SOSY
40.0000 mg | PREFILLED_SYRINGE | Freq: Every day | INTRAMUSCULAR | Status: DC
Start: 2023-07-25 — End: 2023-07-28
  Administered 2023-07-25 – 2023-07-27 (×3): 40 mg via SUBCUTANEOUS
  Filled 2023-07-25 (×3): qty 0.4

## 2023-07-25 MED ORDER — INSULIN GLARGINE-YFGN 100 UNIT/ML ~~LOC~~ SOLN
25.0000 [IU] | Freq: Every day | SUBCUTANEOUS | Status: DC
Start: 2023-07-25 — End: 2023-07-26
  Administered 2023-07-25: 25 [IU] via SUBCUTANEOUS
  Filled 2023-07-25 (×2): qty 0.25

## 2023-07-25 MED ORDER — EPINEPHRINE HCL 5 MG/250ML IV SOLN IN NS
1.0000 ug/min | INTRAVENOUS | Status: DC
  Administered 2023-07-26: 2 ug/min via INTRAVENOUS
  Filled 2023-07-25: qty 250

## 2023-07-25 NOTE — Progress Notes (Signed)
 Nutrition Follow-up  DOCUMENTATION CODES:   Not applicable  INTERVENTION:   Consider liberalizing diet to Regular if post op appetite/po intake is poor  Re-Start Ensure Plus High Protein po TID, each supplement provides 350 kcal and 20 grams of protein  Resume snacks between meals; encourage small frequent meals post op  Last BM documented on 6/28-noted scheduled bowel regimen adjusted by MD today  NUTRITION DIAGNOSIS:   Increased nutrient needs related to acute illness as evidenced by estimated needs.  Being addressed   GOAL:   Patient will meet greater than or equal to 90% of their needs  Progressing  MONITOR:   PO intake, Supplement acceptance, Labs, Weight trends  REASON FOR ASSESSMENT:   Consult Enteral/tube feeding initiation and management  ASSESSMENT:   Pt presented for mitral valve clip procedure. Procedure c/b severe residual MR and cardiogenic shock.  PMH significant for nonischemic cardiomyopathy, chronic biventricular HFrEF, obesity hypoventilation syndrome  6/18: s/p mTEER procedure with severe residual MR, severe posterior leaflet restriction result in tear, cardiogenic shock, IABP placement 6/19: OR for impella 5.5 placement 6/20: extubated 6/27: PFTs with severely reduced volumes 6/30: Bedside ECHO with severe MR, RV mod dilated with mild dysfunction 7/03: OR for HM3 LVAD and bioprosthetic MVR Impella 5.5 removed  Pt alert on vent this AM upon RD assessment, able to wave at RD, indicating she wants the tube out. Pt later extubated.   Carb Modified po diet ordered but pt has not had anything to eat yet. Pt appetite pre-op had improved, pt eating mostly 75-100% of meals. Also receiving snacks, supplements  CVP 6-8 Weight up postop, wt this AM 113.8 kg, weight yesterday pre-op 110.5 kg. Current dry weight not known  Last BM 6/28-dulcolax daily, colace daily ordered today. Pt also on Reglan  postop  Drips:  Levophed  down to 6 Vasopressin  at  0.03 Epinephrine  stable at 2 Insulin  Milrinone   Labs:  Sodium 134 (L) Potassium 3.8 (wdl) Phosphorus 4.2 (wdl) Magnesium  2.2 (wdl) Creatinine 1.15 BUN 24  Meds:  Lasix  BID SS novolog  Semglee  25 units daily-off post op insulin  drip Dulcolax daily Colace daily Reglan  10 mg IV q 6 hours x 20 doses    Diet Order:   Diet Order             Diet Carb Modified Fluid consistency: Thin; Room service appropriate? Yes  Diet effective now                   EDUCATION NEEDS:   No education needs have been identified at this time  Skin:  Skin Assessment: Skin Integrity Issues: Skin Integrity Issues:: Incisions Incisions: Driveline-new LVAD on 7/3  Last BM:  6/28  Height:   Ht Readings from Last 1 Encounters:  07/25/23 5' 10 (1.778 m)    Weight:   Wt Readings from Last 1 Encounters:  07/25/23 113.8 kg    Ideal Body Weight:  68.2 kg  BMI:  Body mass index is 36 kg/m.  Estimated Nutritional Needs:   Kcal:  1900-2100  Protein:  100-115g  Fluid:  >/=1.9L    Betsey Finger MS, RDN, LDN, CNSC Registered Dietitian 3 Clinical Nutrition RD Inpatient Contact Info in Amion

## 2023-07-25 NOTE — Progress Notes (Signed)
 Patient ID: [name] [name], female   DOB: [birth year], 48 y.o.   MRN: [id number]     Advanced Heart Failure Rounding Note  Cardiologist: Ria Commander, DO  Chief Complaint: Cardiogenic Shock Subjective:    6/17: mTEER with sev posterior leaflet restriction resulting in tear. IABP + PAC. PCWP 40, v up to 60 6/24 : Aggressive diuresed. Started on lasix  drip. Brisk diuresis noted.  6/30: Echo with EF 25-30%, RV moderately dysfunctional/mildly dilated, Mitraclip present with severe MR, mild-moderate TR, dilated IVC.  07/24/23: s/p HM III LVAD and bioprosthetic MVR  Weaning NO, down to 3 ppm. Currently on Epi 2, NE 6, milrinone  0.25, vasopressin  0.03. Creatinine 1.15 today.   Hgb 7.5 today  MAP stable 90s.  Weight up post-op.   A-pacing at 80  Awake on vent.   Swan numbers: RA 8 PA 39/20 CI 2.92 Co-ox 66%  LVAD Interrogation HM III: Speed: 5700 Flow: 4.4 PI: 2.9 Power: 4.1.  No PI events.     Objective:    Weight Range: 113.8 kg Body mass index is 36 kg/m.   Vital Signs:   Temp:  [90.9 F (32.7 C)-98.2 F (36.8 C)] 97.2 F (36.2 C) (07/04 0711) Pulse Rate:  [79-204] 80 (07/04 0600) Resp:  [15-30] 20 (07/04 0711) BP: (60-126)/(27-113) 62/53 (07/03 1839) SpO2:  [85 %-100 %] 98 % (07/04 0600) Arterial Line BP: (54-133)/(39-103) 84/74 (07/04 0711) FiO2 (%):  [50 %-100 %] 50 % (07/04 0400) Weight:  [113.8 kg] 113.8 kg (07/04 0515) Last BM Date : 07/19/23  Weight change: Filed Weights   07/24/23 0608 07/24/23 0611 07/25/23 0515  Weight: 110.5 kg (P) 110.5 kg 113.8 kg   Intake/Output:  Intake/Output Summary (Last 24 hours) at 07/25/2023 0735 Last data filed at 07/25/2023 0700 Gross per 24 hour  Intake 7604.88 ml  Output 3275 ml  Net 4329.88 ml   Physical Exam   Vitals:   07/25/23 0705 07/25/23 0711  BP:    Pulse:    Resp: 20 20  Temp: (!) 97.2 F (36.2 C) (!) 97.2 F (36.2 C)  SpO2:     General: Awake on vent.  HEENT: Normal. Neck: Supple, JVP 8 cm.  Carotids OK.  Cardiac:  Mechanical heart sounds with LVAD hum present.  Lungs:  CTAB, normal effort.  Abdomen:  NT, ND, no HSM. No bruits or masses. +BS  LVAD exit site: Well-healed and incorporated. Dressing dry and intact. No erythema or drainage. Stabilization device present and accurately applied. Driveline dressing changed daily per sterile technique. Extremities:  Warm and dry. No cyanosis, clubbing, rash, or edema.  Neuro:  Awake and follows commands.    Telemetry  A-paced 80s (Personally reviewed)    Labs  CBC Recent Labs    07/24/23 2200 07/24/23 2207 07/25/23 0500 07/25/23 0510  WBC 27.8*  --  21.0*  --   NEUTROABS  --   --  18.9*  --   HGB 8.0*   < > 7.5* 7.5*  HCT 24.8*   < > 22.7* 22.0*  MCV 87.9  --  87.0  --   PLT 96*  --  105*  --    < > = values in this interval not displayed.   Basic Metabolic Panel Recent Labs    92/96/74 2200 07/24/23 2207 07/25/23 0500 07/25/23 0510  NA 133*   < > 133* 134*  K 4.4   < > 3.7 3.8  CL 100  --  99  --   CO2 23  --  26  --   GLUCOSE 136*  --  122*  --   BUN 27*  --  24*  --   CREATININE 1.51*  --  1.15*  --   CALCIUM  8.5*  --  8.8*  --   MG 2.8*  --  2.2  --   PHOS  --   --  4.2  --    < > = values in this interval not displayed.   Liver Function Tests Recent Labs    07/23/23 0432 07/25/23 0500  AST 33 91*  ALT 14 22  ALKPHOS 74 50  BILITOT 1.2 2.2*  PROT 7.6 5.8*  ALBUMIN  2.7* 2.6*   BNP (last 3 results) Recent Labs    06/30/23 1218 07/04/23 1057 07/25/23 0500  BNP 600.6* 612.0* 938.1*   Fasting Lipid Panel No results for input(s): CHOL, HDL, LDLCALC, TRIG, CHOLHDL, LDLDIRECT in the last 72 hours.  Medications:   Scheduled Medications:  sodium chloride    Intravenous Once   sodium chloride    Intravenous Once   acetaminophen   1,000 mg Oral Q6H   Or   acetaminophen  (TYLENOL ) oral liquid 160 mg/5 mL  1,000 mg Per Tube Q6H   aspirin  EC  325 mg Oral Daily   Or   aspirin   324 mg Per  Tube Daily   Or   aspirin   300 mg Rectal Daily   bisacodyl   10 mg Oral Daily   Or   bisacodyl   10 mg Rectal Daily   Chlorhexidine  Gluconate Cloth  6 each Topical Daily   docusate sodium   200 mg Oral Daily   enoxaparin  (LOVENOX ) injection  40 mg Subcutaneous QHS   furosemide   40 mg Intravenous BID   gabapentin   200 mg Per Tube QHS   insulin  aspart  0-24 Units Subcutaneous Q4H   insulin  glargine-yfgn  25 Units Subcutaneous Daily   metoCLOPramide  (REGLAN ) injection  10 mg Intravenous Q6H   mouth rinse  15 mL Mouth Rinse Q2H   [START ON 07/26/2023] pantoprazole   40 mg Oral Daily   sodium chloride  flush  3 mL Intravenous Q12H   Infusions:  sodium chloride  Stopped (07/25/23 0606)   sodium chloride      sodium chloride  10 mL/hr at 07/25/23 0700    ceFAZolin  (ANCEF ) IV Stopped (07/25/23 0557)   epinephrine  2 mcg/min (07/25/23 0700)   famotidine  (PEPCID ) IV Stopped (07/24/23 2221)   fluconazole  (DIFLUCAN ) IV 100 mL/hr at 07/25/23 0700   lactated ringers      lactated ringers  20 mL/hr at 07/25/23 0700   milrinone  0.25 mcg/kg/min (07/25/23 0700)   norepinephrine  (LEVOPHED ) Adult infusion 6 mcg/min (07/25/23 0700)   potassium chloride      rifampin  (RIFADIN ) 600 mg in sodium chloride  0.9 % 100 mL IVPB     vancomycin  Stopped (07/24/23 2357)   vasopressin  0.03 Units/min (07/25/23 0700)   PRN Medications: sodium chloride , morphine  injection, ondansetron  (ZOFRAN ) IV, mouth rinse, oxyCODONE , sodium chloride  flush, traMADol   Patient Profile   48 y.o. female with history of chronic systolic HF with biventricular dysfunction on home inotrope, severe MR, hx PE/DVT, obesity, CAD, anemia, tobacco use. Presented for mTEER on 07/09/23. Worsening MR leading to cardiogenic shock. S/p LVAD 07/24/23.   Assessment/Plan  Cardiogenic shock>>>now S/P HM III LVAD 07/24/23: due to torrential MR s/p mTEER complicated by severely restricted posterior leaflet that was damaged during repair attempt, IABP and nipride   needed to stabilize. Transitioned to Impella 5.5. Not a transplant candidate with tobacco use.  Nonobstructive CAD on cath 2023.  -  Limited echo 7/1: LV systolic function did look improved from prior in 30-35% range, there is thinning/akinesis of the inferolateral wall, mild-moderate RV dysfunction, severe MR, dilated IVC.   - Reviewing prior echoes, the inferolateral wall has been akinetic long-term.  Cath in 5/23 showed mild nonobstructive CAD. Patient has never had cardiac MRI.  - 7/3 HM 3 + bioprosthetic MV replacement.  - LVAD parameters stable today.  - Currently on Epi 2, NE 6, milrinone  0.25, vasopressin  0.03. Creatinine 1.15 today. NO down to 3.  CI and co-ox excellent, CVP 8.  PA pressure significantly lower post-op at 39/20.  Wean pressors gradually, starting with NE and vasopressin .  - Lasix  40 mg IV x 1 today and follow response.   HM 3 LVAD - Stable parameters today.  - Hopefully start getting CTs out tomorrow.  - Start warfarin tomorrow.    Torrential MR - S/p bioprosthetic MVR.    Acute hypoxic respiratory failure - Intubated post-LVAD.  - Awake on vent, hopefully extubate today.   Hx of PE/DVT w/ IVC filter - Evaluated at [facility] where thrombophilia work-up was negative (Lupus inhibitor, anticardiolipin antibodies, and anti beta-2  glycoprotein antibodies were negative; protein C activity 85%, protein S activity 76%, factor V Leiden, and prothrombin gene mutation ) - IVC filter in place; could not be removed due to significant thrombus burden on filter at [facility]  - Off anticoagulation post-op, restart when ok with TCTS (warfarin tomorrow).    CAD - LHC in 5/23 w/ nonobstructive CAD involving RCA - No aspirin  with need for anticoagulation.  Tobacco use - Smoking 3 cigarettes/day at time of last follow-up.  Anemia  - Transfuse for HGB < 8. 1 unit PRBCs today.   PVCs - Restart oral amiodarone .   AKI - Creatinine lower at 1.15 today (1.4 pre-op).   Length of Stay:  61  Ezra Shuck, MD  07/25/2023, 7:35 AM  Advanced Heart Failure Team Pager [phone] (M-F; 7a - 5p)  Please contact [facility] for night-coverage after hours (5p -7a ) and weekends on [email or web address]   CRITICAL CARE Performed by: Ezra Shuck  Total critical care time: 45 minutes  Critical care time was exclusive of separately billable procedures and treating other patients.  Critical care was necessary to treat or prevent imminent or life-threatening deterioration.  Critical care was time spent personally by me on the following activities: development of treatment plan with patient and/or surrogate as well as nursing, discussions with consultants, evaluation of patient's response to treatment, examination of patient, obtaining history from patient or surrogate, ordering and performing treatments and interventions, ordering and review of laboratory studies, ordering and review of radiographic studies, pulse oximetry and re-evaluation of patient's condition.  Ezra Shuck 07/25/2023 7:35 AM

## 2023-07-25 NOTE — Progress Notes (Signed)
 Patient ID: [name] [name], female   DOB: [birth year], 48 y.o.   MRN: [id number] TCTS Evening Rounds:  Hemodynamically stable today. NE off briefly but back on low dose this evening. CI 2.76. PA 41/23, CVP 6.  Extubated without difficulty. Sat on side of bed and stood. CT output low. UO good with lasix  but still positive today.  Hgb 7.5 to 7.9 after 1 unit of blood this am. Will give another this pm.  BMET    Component Value Date/Time   NA 133 (L) 07/25/2023 1707   NA 138 09/16/2022 0820   K 3.7 07/25/2023 1707   CL 99 07/25/2023 1634   CO2 24 07/25/2023 1634   GLUCOSE 142 (H) 07/25/2023 1634   BUN 24 (H) 07/25/2023 1634   BUN 22 09/16/2022 0820   CREATININE 1.22 (H) 07/25/2023 1634   CREATININE 0.74 01/31/2017 1119   CALCIUM  8.8 (L) 07/25/2023 1634   EGFR 66 09/16/2022 0820   GFRNONAA 55 (L) 07/25/2023 1634   CBC    Component Value Date/Time   WBC 18.2 (H) 07/25/2023 1634   RBC 2.85 (L) 07/25/2023 1634   HGB 7.8 (L) 07/25/2023 1707   HGB 11.7 09/16/2022 0820   HCT 23.0 (L) 07/25/2023 1707   HCT 40.3 09/16/2022 0820   PLT 128 (L) 07/25/2023 1634   PLT 254 09/16/2022 0820   MCV 86.0 07/25/2023 1634   MCV 80 09/16/2022 0820   MCH 27.7 07/25/2023 1634   MCHC 32.2 07/25/2023 1634   RDW 21.2 (H) 07/25/2023 1634   RDW 20.7 (H) 09/16/2022 0820   LYMPHSABS 0.5 (L) 07/25/2023 0500   LYMPHSABS 1.0 09/16/2022 0820   MONOABS 1.3 (H) 07/25/2023 0500   EOSABS 0.0 07/25/2023 0500   EOSABS 0.2 09/16/2022 0820   BASOSABS 0.1 07/25/2023 0500   BASOSABS 0.1 09/16/2022 0820

## 2023-07-25 NOTE — Procedures (Signed)
 Extubation Procedure Note  Patient Details:   Name: [name] [name] DOB: 1975-03-30 MRN: [id number]   Airway Documentation:    Vent end date: 07/25/23 Vent end time: 1020   Evaluation  O2 sats: stable throughout Complications: No apparent complications Patient did tolerate procedure well. Bilateral Breath Sounds: Clear, Diminished   Yes  Patient performed a NIF -30 and VC 810 mL with good effort.  Patient extubated to 4L [place]; performed IS of 750 mL  [name] 07/25/2023, 10:29 AM

## 2023-07-25 NOTE — Progress Notes (Signed)
 LVAD Coordinator Rounding Note:  Admitted 07/09/23 post mitral clip with severe residual mitral regurgitation and cardiogenic shock. Impella 5.5 placed 07/10/23 and VAD evaluation initiated.  HM III LVAD implanted on 07/24/23 by Dr.Bartle under destination criteria.  POD1 HM3 and MVR. Remains intubated currently weaning on ventilator. NO2 weaned overnight to 2 ppm.   Vital signs: HR: 80 DDD Doppler Pressure:84 Automatic BP: 80/71 (75) O2 Sat: 98 (per ABG 0510) Wt: 250.8     LVAD interrogation reveals:   Speed: 4.5 Flow: 5700 Power:  4.3 w PI:3.3  Alarms: none Events:  none Hematocrit: 20 Fixed speed: 5700 Low speed limit: 5400  Drive Line: Existing VAD dressing removed and site care performed using sterile technique. Drive line exit site cleaned with Chlora prep applicators x 2, allowed to dry, and gauze dressing with silver strip applied flush to exit site. Exit site healing and unincorporated, the velour is fully implanted at exit site. Scant drainage. No redness, tenderness, foul odor or rash noted. Drive line anchor secured. Will advance to every other day dressing changes. Next dressing change 07/26/23 by VAD Coordinator or nurse champion.     Labs:  LDH trend:476  INR trend: 1.4  Anticoagulation Plan: -INR Goal: 2.0-2.5 -ASA Dose: n/a  Blood Products:  Intra-op: 2 FFP, 1 Platelet, 2 Cryo Post-op:  07/24/23: 1 PRBC 07/25/23:1 PRBC  Device:  Arrythmias:  HX: NSVT on p.o. amiodarone   Respiratory:Intubated; currently weaning  Infection:  Renal:  -BUN/CRT: 24/1.15  Adverse Events on VAD:  VAD Education: Pt remains intubated. Education not appropriate at this time.No family at bedside.  Plan/Recommendations:   1.Page VAD coordinator with any VAD alarms or equipment issues. 2. Drive line dressing to be changed daily by VAD Coordinator or Oncologist.   [name] Lunger RN, BSN VAD Coordinator 24/7 Pager [phone]

## 2023-07-25 NOTE — Progress Notes (Addendum)
 Pt has tolerated iNO wean overnight. Currently on 3ppm. No changes in VS/VAD flows.

## 2023-07-25 NOTE — Progress Notes (Signed)
 iNO weaned from 30 to 25ppm per order. CVP 8, Flow 4.4, unchanged.

## 2023-07-25 NOTE — Progress Notes (Signed)
 Patient ID: [name] [name], female   DOB: 06-15-1975, 48 y.o.   MRN: [id number]  HeartMate 3 Rounding Note  Subjective:    Stable night. NE weaned. NO weaned to 2 ppm without difficulty. Currently on milrinone  0.25, epi 2, NE 6, vaso 0.03 PA 41/19, CVP 8, CI 2.92, Co-ox 66%   LVAD INTERROGATION:  HeartMate IIl LVAD:  Flow 4.5 liters/min, speed 5700, power 4, PI 2.7.  No events.   Objective:    Vital Signs:   Temp:  [90.9 F (32.7 C)-98.2 F (36.8 C)] 97.2 F (36.2 C) (07/04 0711) Pulse Rate:  [79-204] 80 (07/04 0600) Resp:  [15-30] 20 (07/04 0711) BP: (60-126)/(27-113) 62/53 (07/03 1839) SpO2:  [85 %-100 %] 98 % (07/04 0600) Arterial Line BP: (54-133)/(39-103) 84/74 (07/04 0711) FiO2 (%):  [50 %-100 %] 50 % (07/04 0400) Weight:  [113.8 kg] 113.8 kg (07/04 0515) Last BM Date : 07/19/23 Mean arterial Pressure 78  Intake/Output:   Intake/Output Summary (Last 24 hours) at 07/25/2023 0718 Last data filed at 07/25/2023 0700 Gross per 24 hour  Intake 7604.88 ml  Output 3275 ml  Net 4329.88 ml     Physical Exam: General:  Intubated on vent, calm and awake Cor: distant heart sounds with LVAD hum present. Lungs: clear Abdomen: soft, nontender, nondistended, some bowel sounds. Extremities: mild edema Neuro: moves all 4 extremities w/o difficulty. Calm and following commands  Telemetry: Atrial paced 80.  Labs: Basic Metabolic Panel: Recent Labs  Lab 07/23/23 0417 07/23/23 0627 07/24/23 0405 07/24/23 0812 07/24/23 1227 07/24/23 1337 07/24/23 1342 07/24/23 1545 07/24/23 1550 07/24/23 1827 07/24/23 2200 07/24/23 2207 07/25/23 0500 07/25/23 0510  NA 131*   < > 128*   < > 133* 135   < > 137   < > 134* 133* 134* 133* 134*  K 4.5   < > 4.0   < > 4.1 3.5   < > 3.4*   < > 4.4 4.4 4.2 3.7 3.8  CL 94*  --  91*   < > 94* 95*  --  101  --   --  100  --  99  --   CO2 28  --  29  --   --   --   --  26  --   --  23  --  26  --   GLUCOSE 107*  --  104*   < > 165* 104*  --  66*   --   --  136*  --  122*  --   BUN 25*  --  28*   < > 29* 29*  --  26*  --   --  27*  --  24*  --   CREATININE 1.46*  --  1.58*   < > 1.50* 1.40*  --  1.55*  --   --  1.51*  --  1.15*  --   CALCIUM  9.4  --  9.2  --   --   --   --  8.5*  --   --  8.5*  --  8.8*  --   MG 1.8  --  1.9  --   --   --   --  2.2  --   --  2.8*  --  2.2  --   PHOS  --   --   --   --   --   --   --   --   --   --   --   --  4.2  --    < > = values in this interval not displayed.    Liver Function Tests: Recent Labs  Lab 07/23/23 0432 07/25/23 0500  AST 33 91*  ALT 14 22  ALKPHOS 74 50  BILITOT 1.2 2.2*  PROT 7.6 5.8*  ALBUMIN  2.7* 2.6*   No results for input(s): LIPASE, AMYLASE in the last 168 hours. No results for input(s): AMMONIA in the last 168 hours.  CBC: Recent Labs  Lab 07/23/23 0417 07/23/23 0627 07/24/23 0405 07/24/23 0812 07/24/23 1223 07/24/23 1227 07/24/23 1545 07/24/23 1550 07/24/23 1827 07/24/23 2200 07/24/23 2207 07/25/23 0500 07/25/23 0510  WBC 9.3  --  7.1  --   --   --  27.7*  --   --  27.8*  --  21.0*  --   NEUTROABS  --   --   --   --   --   --   --   --   --   --   --  18.9*  --   HGB 9.2*   < > 8.6*   < > 6.1*   < > 7.5*   < > 7.1* 8.0* 8.2* 7.5* 7.5*  HCT 30.5*   < > 28.4*   < > 19.9*   < > 23.6*   < > 21.0* 24.8* 24.0* 22.7* 22.0*  MCV 89.4  --  89.0  --   --   --  89.7  --   --  87.9  --  87.0  --   PLT 120*  --  120*  --  99*  --  85*  84*  --   --  96*  --  105*  --    < > = values in this interval not displayed.    INR: Recent Labs  Lab 07/24/23 1545 07/25/23 0500  INR 1.6*  1.6* 1.4*    Other results: EKG:   Imaging: DG Chest Port 1 View Result Date: 07/24/2023 CLINICAL DATA:  Left ventricular assist device present. EXAM: PORTABLE CHEST 1 VIEW COMPARISON:  Radiograph 07/19/2023 FINDINGS: Interval median sternotomy. New left ventricular assist device in place. Inferior aspect is not entirely included in the field of view. The previous Impella has  been removed. Left internal jugular Swan-Ganz catheter tip in the region of the right main pulmonary outflow track. The endotracheal tube tip is at the level of the clavicular heads. Left-sided chest tube with tip directed towards the apex. Right-sided chest tube in the mid lung. Presumed mediastinal drain. Enteric tube tip not included in the field of view below the diaphragm. The heart is enlarged. Vascular congestion again seen. No pneumothorax. There may be a left pleural effusion. IMPRESSION: 1. Interval median sternotomy with left ventricular assist device in place. 2. Support apparatus as described. 3. Cardiomegaly with vascular congestion. Possible left pleural effusion. Electronically Signed   By: Andrea Gasman M.D.   On: 07/24/2023 17:00   US  EKG SITE RITE Result Date: 07/23/2023 If Site Rite image not attached, placement could not be confirmed due to current cardiac rhythm.    Medications:     Scheduled Medications:  sodium chloride    Intravenous Once   sodium chloride    Intravenous Once   acetaminophen   1,000 mg Oral Q6H   Or   acetaminophen  (TYLENOL ) oral liquid 160 mg/5 mL  1,000 mg Per Tube Q6H   aspirin  EC  325 mg Oral Daily   Or   aspirin   324 mg Per Tube Daily  Or   aspirin   300 mg Rectal Daily   bisacodyl   10 mg Oral Daily   Or   bisacodyl   10 mg Rectal Daily   Chlorhexidine  Gluconate Cloth  6 each Topical Daily   docusate sodium   200 mg Oral Daily   enoxaparin  (LOVENOX ) injection  40 mg Subcutaneous QHS   furosemide   40 mg Intravenous BID   gabapentin   200 mg Per Tube QHS   insulin  aspart  0-24 Units Subcutaneous Q4H   insulin  glargine-yfgn  25 Units Subcutaneous Daily   metoCLOPramide  (REGLAN ) injection  10 mg Intravenous Q6H   mouth rinse  15 mL Mouth Rinse Q2H   [START ON 07/26/2023] pantoprazole   40 mg Oral Daily   sodium chloride  flush  3 mL Intravenous Q12H    Infusions:  sodium chloride  Stopped (07/25/23 0606)   sodium chloride      sodium chloride   10 mL/hr at 07/25/23 0700    ceFAZolin  (ANCEF ) IV Stopped (07/25/23 0557)   epinephrine  2 mcg/min (07/25/23 0700)   famotidine  (PEPCID ) IV Stopped (07/24/23 2221)   fluconazole  (DIFLUCAN ) IV 100 mL/hr at 07/25/23 0700   lactated ringers      lactated ringers  20 mL/hr at 07/25/23 0700   milrinone  0.25 mcg/kg/min (07/25/23 0700)   norepinephrine  (LEVOPHED ) Adult infusion 6 mcg/min (07/25/23 0700)   potassium chloride      rifampin  (RIFADIN ) 600 mg in sodium chloride  0.9 % 100 mL IVPB     vancomycin  Stopped (07/24/23 2357)   vasopressin  0.03 Units/min (07/25/23 0700)    PRN Medications: sodium chloride , morphine  injection, ondansetron  (ZOFRAN ) IV, mouth rinse, oxyCODONE , sodium chloride  flush, traMADol    Assessment:  POD 1 HeartMate 3 LVAD as destination therapy, removal of Impella and mitral valve replacement with 33 mm Medtronic Mosaic porcine valve. Noted to have acute/subacute pericarditis with inflammatory exudate over heart and pericardium.  Hx of stage D non-ischemic cardiomyopathy and chronic systolic heart failure with biventricular dysfunction and severe MR on home milrinone . Failed mTEER with torrential MR and severe pulmonary hypertension, acute hypoxic respiratory failure, cardiogenic shock requiring IABP and then left subclavian Impella 5.5 for stabilization.   Hx of DVT/PE with IVC filter in past, thrombophilia workup negative at [facility]. Filter could not be removed due to thrombus burden on filter.  Hx of smoking and COPD by PFT's preop with FEV 1 of 1.09 and FVC of 1.43 but probably affected by heart failure and severe pulm htn from MR. She was walking around the ICU with Impella so I suspect her pulmonary function will not be an issue.  Morbid obesity with BMI 36. She was moving around well preop with severe MR/severe pulmonary HTN and Impella.  Malnutrition with albumin  2.6 preop although she was eating well recently.  Expected postop blood loss anemia  Plan/Discussion:     Wean to extubate this am.  Keep chest tubes and pocket drain in today.  Transfuse 1 unit PRBC's this am  Start diuresis. Creat was rising the past 3 days preop but back down this am. Still may have some AKI.  Plan to start Coumadin  tomorrow.  Continue milrinone  and low dose epi.  Wean NE and vasopressin  as BP allows. MAP>65. Goal 70-90.  Start SEMGLEE  and SSI this am and DC insulin  drip. Probably be able to back off on SEMGLEE  as inotropes weaned.    I reviewed the LVAD parameters from today, and compared the results to the patient's prior recorded data.  No programming changes were made.  The LVAD is functioning  within specified parameters. LVAD interrogation was negative for any significant power changes, alarms or PI events/speed drops.  LVAD equipment check completed and is in good working order.  Back-up equipment present.    Length of Stay: [address]  Dorise POUR [facility] 07/25/2023, 7:18 AM

## 2023-07-26 ENCOUNTER — Inpatient Hospital Stay (HOSPITAL_COMMUNITY)

## 2023-07-26 DIAGNOSIS — R570 Cardiogenic shock: Secondary | ICD-10-CM | POA: Diagnosis not present

## 2023-07-26 DIAGNOSIS — Z9889 Other specified postprocedural states: Secondary | ICD-10-CM

## 2023-07-26 DIAGNOSIS — Z952 Presence of prosthetic heart valve: Secondary | ICD-10-CM

## 2023-07-26 DIAGNOSIS — I5023 Acute on chronic systolic (congestive) heart failure: Secondary | ICD-10-CM | POA: Diagnosis not present

## 2023-07-26 DIAGNOSIS — Z95828 Presence of other vascular implants and grafts: Secondary | ICD-10-CM

## 2023-07-26 LAB — CBC WITH DIFFERENTIAL/PLATELET
Abs Immature Granulocytes: 0.12 K/uL — ABNORMAL HIGH (ref 0.00–0.07)
Basophils Absolute: 0.1 K/uL (ref 0.0–0.1)
Basophils Relative: 1 %
Eosinophils Absolute: 0.2 K/uL (ref 0.0–0.5)
Eosinophils Relative: 1 %
HCT: 26.7 % — ABNORMAL LOW (ref 36.0–46.0)
Hemoglobin: 8.5 g/dL — ABNORMAL LOW (ref 12.0–15.0)
Immature Granulocytes: 1 %
Lymphocytes Relative: 3 %
Lymphs Abs: 0.5 K/uL — ABNORMAL LOW (ref 0.7–4.0)
MCH: 27.5 pg (ref 26.0–34.0)
MCHC: 31.8 g/dL (ref 30.0–36.0)
MCV: 86.4 fL (ref 80.0–100.0)
Monocytes Absolute: 2.2 K/uL — ABNORMAL HIGH (ref 0.1–1.0)
Monocytes Relative: 12 %
Neutro Abs: 15.1 K/uL — ABNORMAL HIGH (ref 1.7–7.7)
Neutrophils Relative %: 82 %
Platelets: 160 K/uL (ref 150–400)
RBC: 3.09 MIL/uL — ABNORMAL LOW (ref 3.87–5.11)
RDW: 22.5 % — ABNORMAL HIGH (ref 11.5–15.5)
Smear Review: NORMAL
WBC: 18.2 K/uL — ABNORMAL HIGH (ref 4.0–10.5)
nRBC: 0 % (ref 0.0–0.2)

## 2023-07-26 LAB — POCT I-STAT 7, (LYTES, BLD GAS, ICA,H+H)
Acid-Base Excess: 0 mmol/L (ref 0.0–2.0)
Acid-base deficit: 1 mmol/L (ref 0.0–2.0)
Bicarbonate: 26.2 mmol/L (ref 20.0–28.0)
Bicarbonate: 23.6 mmol/L (ref 20.0–28.0)
Calcium, Ion: 1.23 mmol/L (ref 1.15–1.40)
Calcium, Ion: 1.22 mmol/L (ref 1.15–1.40)
HCT: 29 % — ABNORMAL LOW (ref 36.0–46.0)
HCT: 27 % — ABNORMAL LOW (ref 36.0–46.0)
Hemoglobin: 9.9 g/dL — ABNORMAL LOW (ref 12.0–15.0)
Hemoglobin: 9.2 g/dL — ABNORMAL LOW (ref 12.0–15.0)
O2 Saturation: 47 %
O2 Saturation: 83 %
Patient temperature: 36.7
Patient temperature: 36.7
Potassium: 4.2 mmol/L (ref 3.5–5.1)
Potassium: 4.2 mmol/L (ref 3.5–5.1)
Sodium: 133 mmol/L — ABNORMAL LOW (ref 135–145)
Sodium: 133 mmol/L — ABNORMAL LOW (ref 135–145)
TCO2: 28 mmol/L (ref 22–32)
TCO2: 25 mmol/L (ref 22–32)
pCO2 arterial: 45.5 mmHg (ref 32–48)
pCO2 arterial: 36 mmHg (ref 32–48)
pH, Arterial: 7.366 (ref 7.35–7.45)
pH, Arterial: 7.422 (ref 7.35–7.45)
pO2, Arterial: 26 mmHg — CL (ref 83–108)
pO2, Arterial: 45 mmHg — ABNORMAL LOW (ref 83–108)

## 2023-07-26 LAB — COMPREHENSIVE METABOLIC PANEL WITH GFR
ALT: 16 U/L (ref 0–44)
AST: 81 U/L — ABNORMAL HIGH (ref 15–41)
Albumin: 2.7 g/dL — ABNORMAL LOW (ref 3.5–5.0)
Alkaline Phosphatase: 56 U/L (ref 38–126)
Anion gap: 12 (ref 5–15)
BUN: 27 mg/dL — ABNORMAL HIGH (ref 6–20)
CO2: 23 mmol/L (ref 22–32)
Calcium: 9.1 mg/dL (ref 8.9–10.3)
Chloride: 96 mmol/L — ABNORMAL LOW (ref 98–111)
Creatinine, Ser: 1.42 mg/dL — ABNORMAL HIGH (ref 0.44–1.00)
GFR, Estimated: 46 mL/min — ABNORMAL LOW (ref >60)
Glucose, Bld: 118 mg/dL — ABNORMAL HIGH (ref 70–99)
Potassium: 4.2 mmol/L (ref 3.5–5.1)
Sodium: 131 mmol/L — ABNORMAL LOW (ref 135–145)
Total Bilirubin: 2.5 mg/dL — ABNORMAL HIGH (ref 0.0–1.2)
Total Protein: 6.2 g/dL — ABNORMAL LOW (ref 6.5–8.1)

## 2023-07-26 LAB — GLUCOSE, CAPILLARY
Glucose-Capillary: 110 mg/dL — ABNORMAL HIGH (ref 70–99)
Glucose-Capillary: 133 mg/dL — ABNORMAL HIGH (ref 70–99)
Glucose-Capillary: 133 mg/dL — ABNORMAL HIGH (ref 70–99)
Glucose-Capillary: 104 mg/dL — ABNORMAL HIGH (ref 70–99)
Glucose-Capillary: 128 mg/dL — ABNORMAL HIGH (ref 70–99)
Glucose-Capillary: 89 mg/dL (ref 70–99)

## 2023-07-26 LAB — MAGNESIUM: Magnesium: 1.9 mg/dL (ref 1.7–2.4)

## 2023-07-26 LAB — PROTIME-INR
INR: 1.4 — ABNORMAL HIGH (ref 0.8–1.2)
Prothrombin Time: 17.7 s — ABNORMAL HIGH (ref 11.4–15.2)

## 2023-07-26 LAB — LACTATE DEHYDROGENASE: LDH: 554 U/L — ABNORMAL HIGH (ref 98–192)

## 2023-07-26 LAB — COOXEMETRY PANEL
Carboxyhemoglobin: 2.4 % — ABNORMAL HIGH (ref 0.5–1.5)
Methemoglobin: 0.7 % (ref 0.0–1.5)
O2 Saturation: 58.4 %
Total hemoglobin: 9 g/dL — ABNORMAL LOW (ref 12.0–16.0)

## 2023-07-26 LAB — PHOSPHORUS: Phosphorus: 5.4 mg/dL — ABNORMAL HIGH (ref 2.5–4.6)

## 2023-07-26 MED ORDER — FUROSEMIDE 10 MG/ML IJ SOLN
60.0000 mg | Freq: Two times a day (BID) | INTRAMUSCULAR | Status: AC
  Administered 2023-07-26 (×2): 60 mg via INTRAVENOUS
  Filled 2023-07-26 (×2): qty 6

## 2023-07-26 MED ORDER — MAGNESIUM SULFATE 2 GM/50ML IV SOLN
2.0000 g | Freq: Once | INTRAVENOUS | Status: AC
  Administered 2023-07-26: 2 g via INTRAVENOUS
  Filled 2023-07-26: qty 50

## 2023-07-26 MED ORDER — FE FUM-VIT C-VIT B12-FA 460-60-0.01-1 MG PO CAPS
1.0000 | ORAL_CAPSULE | Freq: Every day | ORAL | Status: DC
  Administered 2023-07-26 – 2023-08-05 (×11): 1 via ORAL
  Filled 2023-07-26 (×11): qty 1

## 2023-07-26 MED ORDER — WARFARIN SODIUM 5 MG PO TABS
5.0000 mg | ORAL_TABLET | Freq: Once | ORAL | Status: AC
  Administered 2023-07-26: 5 mg via ORAL
  Filled 2023-07-26: qty 1

## 2023-07-26 MED ORDER — GABAPENTIN 100 MG PO CAPS
200.0000 mg | ORAL_CAPSULE | Freq: Every day | ORAL | Status: DC
  Administered 2023-07-26 – 2023-07-29 (×4): 200 mg via ORAL
  Filled 2023-07-26 (×4): qty 2

## 2023-07-26 MED ORDER — WARFARIN - PHYSICIAN DOSING INPATIENT: Freq: Every day | Status: DC

## 2023-07-26 MED ORDER — POLYETHYLENE GLYCOL 3350 17 G PO PACK
17.0000 g | PACK | Freq: Every day | ORAL | Status: DC
  Administered 2023-07-27 – 2023-07-31 (×4): 17 g via ORAL
  Filled 2023-07-26 (×7): qty 1

## 2023-07-26 NOTE — Progress Notes (Signed)
 Drive Line: Existing VAD dressing removed and site care performed using sterile technique. Drive line exit site cleaned with Chlora prep applicators x 2, allowed to dry, and gauze dressing with silver strip applied to exit site. Exit site healing and unincorporated, the velour is fully implanted at exit site with sutures in place. Scant sanguinous drainage. No redness, tenderness, foul odor or rash noted. Drive line anchor secured. Next dressing change 07/27/23 by VAD Coordinator or nurse champion.

## 2023-07-26 NOTE — Plan of Care (Signed)

## 2023-07-26 NOTE — Progress Notes (Signed)
 Patient ID: [name] [name], female   DOB: May 22, 1975, 48 y.o.   MRN: [id number]  TCTS Evening Rounds:  Hemodynamics stable today on milrinone  0.25 and epi 2.   LVAD parameters stable.  Diuresing some -500 cc so far.  CT output low.  Sitting up in chair.

## 2023-07-26 NOTE — Progress Notes (Signed)
 Epicardial pacing wires removed per protocol. Leads intact upon removal. No sustained ectopy on monitor, LVAD/heart sounds unchanged. Heart sounds and BPs monitored per protocol, and no increase in baseline chest tube output.

## 2023-07-26 NOTE — Progress Notes (Signed)
 Patient ID: [name] [name], female   DOB: 04/03/1975, 48 y.o.   MRN: [id number]  HeartMate 3 Rounding Note  Subjective:    Hemodynamics stable overnight. NE and vaso weaned off.  Milrinone  0.25 and epi 2 PA 45/35, CVP 7, CI 3.7  Co-ox 58 and 47 this am but arterial sat 83% at the time. Apparently pt did not have oxygen on.    LVAD INTERROGATION:  HeartMate IIl LVAD:  Flow 4.4 liters/min, speed 5700, power 4.2, PI 4.2.  No events.   Objective:    Vital Signs:   Temp:  [96.8 F (36 C)-98.6 F (37 C)] 98.4 F (36.9 C) (07/05 0700) Pulse Rate:  [64-150] 99 (07/05 0615) Resp:  [14-46] 33 (07/05 0700) SpO2:  [29 %-100 %] 29 % (07/05 0700) Arterial Line BP: (51-111)/(10-87) 97/72 (07/05 0700) FiO2 (%):  [21 %-40 %] 21 % (07/05 0200) Weight:  [107 kg] 107 kg (07/05 0700) Last BM Date : 07/19/23 Mean arterial Pressure 70's-80's  Intake/Output:   Intake/Output Summary (Last 24 hours) at 07/26/2023 0744 Last data filed at 07/26/2023 0700 Gross per 24 hour  Intake 3093.6 ml  Output 2525 ml  Net 568.6 ml     Physical Exam: General:  Well appearing. No resp difficulty HEENT: normal Neck: supple. JVP . Carotids 2+ bilat; no bruits. No lymphadenopathy or thryomegaly appreciated. Cor: Mechanical heart sounds with LVAD hum present. Lungs: clear Abdomen: soft, nontender, nondistended. No hepatosplenomegaly. No bruits or masses. Good bowel sounds. Extremities: no cyanosis, clubbing, rash, edema Neuro: alert & orientedx3, cranial nerves grossly intact. moves all 4 extremities w/o difficulty. Affect pleasant  Telemetry: NSR 88  Labs: Basic Metabolic Panel: Recent Labs  Lab 07/24/23 1545 07/24/23 1550 07/24/23 2200 07/24/23 2207 07/25/23 0500 07/25/23 0510 07/25/23 1330 07/25/23 1634 07/25/23 1707 07/26/23 0508 07/26/23 0521 07/26/23 0533  NA 137   < > 133*   < > 133*   < > 134* 133* 133* 131* 133* 133*  K 3.4*   < > 4.4   < > 3.7   < > 3.9 3.7 3.7 4.2 4.2 4.2  CL 101  --   100  --  99  --  98 99  --  96*  --   --   CO2 26  --  23  --  26  --  24 24  --  23  --   --   GLUCOSE 66*  --  136*  --  122*  --  149* 142*  --  118*  --   --   BUN 26*  --  27*  --  24*  --  24* 24*  --  27*  --   --   CREATININE 1.55*  --  1.51*  --  1.15*  --  1.09* 1.22*  --  1.42*  --   --   CALCIUM  8.5*  --  8.5*  --  8.8*  --  8.8* 8.8*  --  9.1  --   --   MG 2.2  --  2.8*  --  2.2  --  2.0  --   --  1.9  --   --   PHOS  --   --   --   --  4.2  --   --   --   --  5.4*  --   --    < > = values in this interval not displayed.    Liver Function Tests: Recent Labs  Lab 07/23/23 0432 07/25/23 0500 07/26/23 0508  AST 33 91* 81*  ALT 14 22 16   ALKPHOS 74 50 56  BILITOT 1.2 2.2* 2.5*  PROT 7.6 5.8* 6.2*  ALBUMIN  2.7* 2.6* 2.7*   No results for input(s): LIPASE, AMYLASE in the last 168 hours. No results for input(s): AMMONIA in the last 168 hours.  CBC: Recent Labs  Lab 07/24/23 2200 07/24/23 2207 07/25/23 0500 07/25/23 0510 07/25/23 1330 07/25/23 1634 07/25/23 1707 07/26/23 0508 07/26/23 0521 07/26/23 0533  WBC 27.8*  --  21.0*  --  18.1* 18.2*  --  18.2*  --   --   NEUTROABS  --   --  18.9*  --   --   --   --  PENDING  --   --   HGB 8.0*   < > 7.5*   < > 7.7* 7.9* 7.8* 8.5* 9.9* 9.2*  HCT 24.8*   < > 22.7*   < > 23.6* 24.5* 23.0* 26.7* 29.0* 27.0*  MCV 87.9  --  87.0  --  86.1 86.0  --  86.4  --   --   PLT 96*  --  105*  --  111* 128*  --  160  --   --    < > = values in this interval not displayed.    INR: Recent Labs  Lab 07/24/23 1545 07/25/23 0500 07/26/23 0508  INR 1.6*  1.6* 1.4* 1.4*    Other results: EKG:   Imaging: DG Chest Port 1 View Result Date: 07/25/2023 CLINICAL DATA:  48 year old female postoperative day 1 LVAD implantation yesterday. EXAM: PORTABLE CHEST 1 VIEW COMPARISON:  Portable chest yesterday and earlier. FINDINGS: Portable AP semi upright view at 0539 hours. Stable lines and tubes, including bilateral chest tubes. No  pneumothorax. Perihilar pulmonary vascular congestion not significantly changed. Stable cardiomegaly and mediastinal contours. No pleural effusion or consolidation identified. Stable visualized osseous structures. IMPRESSION: 1. Stable lines and tubes. 2. Stable cardiomegaly and ventilation with no pneumothorax, pulmonary vascular congestion. Electronically Signed   By: VEAR Hurst M.D.   On: 07/25/2023 08:24   DG Chest Port 1 View Result Date: 07/24/2023 CLINICAL DATA:  Left ventricular assist device present. EXAM: PORTABLE CHEST 1 VIEW COMPARISON:  Radiograph 07/19/2023 FINDINGS: Interval median sternotomy. New left ventricular assist device in place. Inferior aspect is not entirely included in the field of view. The previous Impella has been removed. Left internal jugular Swan-Ganz catheter tip in the region of the right main pulmonary outflow track. The endotracheal tube tip is at the level of the clavicular heads. Left-sided chest tube with tip directed towards the apex. Right-sided chest tube in the mid lung. Presumed mediastinal drain. Enteric tube tip not included in the field of view below the diaphragm. The heart is enlarged. Vascular congestion again seen. No pneumothorax. There may be a left pleural effusion. IMPRESSION: 1. Interval median sternotomy with left ventricular assist device in place. 2. Support apparatus as described. 3. Cardiomegaly with vascular congestion. Possible left pleural effusion. Electronically Signed   By: Andrea Gasman M.D.   On: 07/24/2023 17:00     Medications:     Scheduled Medications:  sodium chloride    Intravenous Once   acetaminophen   1,000 mg Oral Q6H   Or   acetaminophen  (TYLENOL ) oral liquid 160 mg/5 mL  1,000 mg Per Tube Q6H   amiodarone   200 mg Oral BID   aspirin  EC  325 mg Oral Daily   Or   aspirin   324 mg Per Tube Daily   Or   aspirin   300 mg Rectal Daily   bisacodyl   10 mg Oral Daily   Or   bisacodyl   10 mg Rectal Daily   Chlorhexidine   Gluconate Cloth  6 each Topical Daily   docusate sodium   200 mg Oral Daily   enoxaparin  (LOVENOX ) injection  40 mg Subcutaneous QHS   feeding supplement  237 mL Oral TID BM   gabapentin   200 mg Per Tube QHS   insulin  aspart  0-24 Units Subcutaneous Q4H   metoCLOPramide  (REGLAN ) injection  10 mg Intravenous Q6H   pantoprazole   40 mg Oral Daily   sodium chloride  flush  3 mL Intravenous Q12H   warfarin  5 mg Oral ONCE-1600    Infusions:   ceFAZolin  (ANCEF ) IV Stopped (07/26/23 0552)   epinephrine  2 mcg/min (07/26/23 0700)   milrinone  0.25 mcg/kg/min (07/26/23 0700)   norepinephrine  (LEVOPHED ) Adult infusion Stopped (07/26/23 0522)    PRN Medications: morphine  injection, ondansetron  (ZOFRAN ) IV, mouth rinse, oxyCODONE , sodium chloride  flush, traMADol    Assessment:   POD 2 HeartMate 3 LVAD as destination therapy, removal of Impella and mitral valve replacement with 33 mm Medtronic Mosaic porcine valve. Noted to have acute/subacute pericarditis with inflammatory exudate over heart and pericardium.   Hx of stage D non-ischemic cardiomyopathy and chronic systolic heart failure with biventricular dysfunction and severe MR on home milrinone . Failed mTEER with torrential MR and severe pulmonary hypertension, acute hypoxic respiratory failure, cardiogenic shock requiring IABP and then left subclavian Impella 5.5 for stabilization.    Hx of DVT/PE with IVC filter in past, thrombophilia workup negative at [facility]. Filter could not be removed due to thrombus burden on filter.   Hx of smoking and COPD by PFT's preop with FEV 1 of 1.09 and FVC of 1.43 but probably affected by heart failure and severe pulm htn from MR. She was walking around the ICU with Impella so I suspect her pulmonary function will not be an issue.   Morbid obesity with BMI 36. She was moving around well preop with severe MR/severe pulmonary HTN and Impella.   Malnutrition with albumin  2.6 preop although she was eating well  recently.   Expected postop blood loss anemia  Plan/Discussion:    She is doing well overall. On low dose inotropes with low normal CVP. Inotrope wean per AHF team.  HR stable. Will remove pacing wires this am.  Chest tube output low and thin. Remove MT's today 2 hrs after pacing wires and keep pleural tubes and pocket drain in today.   Coumadin  5 mg tonight. She was on 10 mg x 5 days and 5 mg x 2 days previously.  Continue diuresis.  Glucose under good control. Will DC SEMGLEE  and continue SSI. She was not on insulin  preop.  IS, OOB.  Hgb improved with transfusion. Start her on iron .  I reviewed the LVAD parameters from today, and compared the results to the patient's prior recorded data.  No programming changes were made.  The LVAD is functioning within specified parameters.  The patient performs LVAD self-test daily.  LVAD interrogation was negative for any significant power changes, alarms or PI events/speed drops.  LVAD equipment check completed and is in good working order.  Back-up equipment present.   LVAD education done on emergency procedures and precautions and reviewed exit site care.  Length of Stay: [address]  Dorise POUR [facility] 07/26/2023, 7:44 AM

## 2023-07-26 NOTE — Evaluation (Signed)
 Physical Therapy Re-Evaluation Patient Details Name: [name] [name] MRN: [id number] DOB: [birth year] Today's Date: 07/26/2023  History of Present Illness  48 yo presented 07/09/23 for transcatheter mitral valve repair with Mitraclip. Post-op severe residual MR and cardiogenic shock. 6/19 impella inserted; extubated 6/20;. S/p HM III LVAD and bioprosthetic MVR 7/3.  PMH-chronic HF (on home milrinone ); CAD, DVT, fibroid tumor, hidradenitis, cardiomyopathy, NSVT, PE, obesity  Clinical Impression  Patient is re-evaluated s/p above surgery resulting in functional limitations due to the deficits listed below (see PT Problem List). Required CGA +2 for line/lead management, for bed mobility, sit to stand transfer, and short distance gait in room to recliner today. Reviewed precautions and LE exercises. Anticipate good progression as she was very mobile on unit prior to LVAD operation. Patient will benefit from acute skilled PT to increase their independence and safety with mobility to facilitate discharge.         If plan is discharge home, recommend the following: Assistance with cooking/housework;Assist for transportation;Help with stairs or ramp for entrance;A little help with walking and/or transfers;A little help with bathing/dressing/bathroom   Can travel by private vehicle        Equipment Recommendations Rollator (4 wheels);BSC/3in1  Recommendations for Other Services       Functional Status Assessment Patient has had a recent decline in their functional status and demonstrates the ability to make significant improvements in function in a reasonable and predictable amount of time.     Precautions / Restrictions Precautions Precautions: Sternal;Fall;Other (comment) Recall of Precautions/Restrictions: Impaired Precaution/Restrictions Comments: LVAD Restrictions Other Position/Activity Restrictions: Sternal precautions      Mobility  Bed Mobility Overal bed mobility: Needs  Assistance Bed Mobility: Supine to Sit     Supine to sit: HOB elevated, Contact guard, +2 for safety/equipment     General bed mobility comments: CGA +2 for safety/equipment use. Cues for technique, used heart pillow for comfort.    Transfers Overall transfer level: Needs assistance Equipment used: None Transfers: Sit to/from Stand Sit to Stand: Contact guard assist, +2 safety/equipment           General transfer comment: CGA for safety +2 for equipment management. Cues for technique, hand placement holding heart pillow for comfort. Slow to rise but stable without physical assist to boost. Performed from bed and recliner x2.    Ambulation/Gait Ambulation/Gait assistance: +2 safety/equipment, Contact guard assist (OT and RN) Gait Distance (Feet): 20 Feet (10+10) Assistive device: None Gait Pattern/deviations: Step-through pattern, Decreased stride length, Wide base of support, Shuffle Gait velocity: dec Gait velocity interpretation: <1.31 ft/sec, indicative of household ambulator   General Gait Details: CGA for safety +2 for equipment management. Cues for safety, upright posture, symptom awareness. Able to walk forward and backwards without buckling or overt LOB.  Stairs            Wheelchair Mobility     Tilt Bed    Modified Rankin (Stroke Patients Only)       Balance Overall balance assessment: Needs assistance Sitting-balance support: Feet supported, No upper extremity supported Sitting balance-Leahy Scale: Good     Standing balance support: During functional activity, No upper extremity supported Standing balance-Leahy Scale: Fair                               Pertinent Vitals/Pain Pain Assessment Pain Assessment: Faces Faces Pain Scale: Hurts little more Pain Location: sternum Pain Descriptors / Indicators: Guarding, Sore Pain Intervention(s):  Limited activity within patient's tolerance, Monitored during session, Repositioned     Home Living Family/patient expects to be discharged to:: Private residence Living Arrangements: Parent;Children Available Help at Discharge: Family;Available PRN/intermittently Type of Home: Mobile home Home Access: Stairs to enter Entrance Stairs-Rails: Right;Left;Can reach both Entrance Stairs-Number of Steps: 3   Home Layout: One level Home Equipment: None Additional Comments: Daughter is currently staying with pt's aunt.    Prior Function Prior Level of Function : Independent/Modified Independent             Mobility Comments: not working--on disability ADLs Comments: No assist with ADL or light iADL     Extremity/Trunk Assessment   Upper Extremity Assessment Upper Extremity Assessment: Defer to OT evaluation    Lower Extremity Assessment Lower Extremity Assessment: Generalized weakness    Cervical / Trunk Assessment Cervical / Trunk Assessment: Normal  Communication   Communication Communication: No apparent difficulties;Impaired Factors Affecting Communication: Reduced clarity of speech    Cognition Arousal: Alert Behavior During Therapy: WFL for tasks assessed/performed   PT - Cognitive impairments: No apparent impairments                         Following commands: Intact       Cueing Cueing Techniques: Verbal cues, Gestural cues     General Comments General comments (skin integrity, edema, etc.): LVAD: Pump flow 4.4, PI 4.2, Pump Power 4.2. HR 83. SpO2 unreadable but no overt dyspnea.    Exercises General Exercises - Lower Extremity Ankle Circles/Pumps: AROM, Both, 10 reps Quad Sets: Strengthening, Both, 10 reps, Seated Gluteal Sets: Strengthening, 10 reps, Both, Seated   Assessment/Plan    PT Assessment Patient needs continued PT services  PT Problem List Decreased strength;Decreased activity tolerance;Decreased balance;Decreased mobility;Decreased knowledge of use of DME;Decreased knowledge of precautions;Cardiopulmonary  status limiting activity;Obesity;Pain;Decreased range of motion       PT Treatment Interventions DME instruction;Gait training;Functional mobility training;Therapeutic activities;Therapeutic exercise;Balance training;Patient/family education;Stair training;Neuromuscular re-education;Modalities    PT Goals (Current goals can be found in the Care Plan section)  Acute Rehab PT Goals Patient Stated Goal: to get better PT Goal Formulation: With patient Time For Goal Achievement: 08/09/23 Potential to Achieve Goals: Good    Frequency Min 2X/week     Co-evaluation PT/OT/SLP Co-Evaluation/Treatment: Yes Reason for Co-Treatment: Complexity of the patient's impairments (multi-system involvement);For patient/therapist safety;To address functional/ADL transfers PT goals addressed during session: Mobility/safety with mobility;Balance;Strengthening/ROM         AM-PAC PT 6 Clicks Mobility  Outcome Measure Help needed turning from your back to your side while in a flat bed without using bedrails?: A Little Help needed moving from lying on your back to sitting on the side of a flat bed without using bedrails?: A Little Help needed moving to and from a bed to a chair (including a wheelchair)?: A Little Help needed standing up from a chair using your arms (e.g., wheelchair or bedside chair)?: A Little Help needed to walk in hospital room?: A Little Help needed climbing 3-5 steps with a railing? : A Lot 6 Click Score: 17    End of Session   Activity Tolerance: Patient tolerated treatment well Patient left: with call bell/phone within reach;in chair;with chair alarm set;with nursing/sitter in room Nurse Communication: Mobility status PT Visit Diagnosis: Unsteadiness on feet (R26.81);Muscle weakness (generalized) (M62.81);Difficulty in walking, not elsewhere classified (R26.2);Pain;Other abnormalities of gait and mobility (R26.89) Pain - part of body:  (sternum)    Time:  8650-8563 PT Time  Calculation (min) (ACUTE ONLY): 47 min   Charges:   PT Evaluation $PT Re-evaluation: 1 Re-eval   PT General Charges $$ ACUTE PT VISIT: 1 Visit         Leontine Roads, PT, DPT [facility]  Rehabilitation Services Physical Therapist Office: [phone] Website: [email or web address]   Leontine GORMAN Roads 07/26/2023, 4:13 PM

## 2023-07-26 NOTE — Progress Notes (Signed)
 Patient ID: [name] [name], female   DOB: [birth year], 48 y.o.   MRN: [id number]     Advanced Heart Failure Rounding Note  Cardiologist: Ria Commander, DO  Chief Complaint: Cardiogenic Shock Subjective:    6/17: mTEER with sev posterior leaflet restriction resulting in tear. IABP + PAC. PCWP 40, v up to 60 6/24 : Aggressive diuresed. Started on lasix  drip. Brisk diuresis noted.  6/30: Echo with EF 25-30%, RV moderately dysfunctional/mildly dilated, Mitraclip present with severe MR, mild-moderate TR, dilated IVC.  7/3: s/p HM III LVAD and bioprosthetic MVR 7/4: Extubated  Currently on Epi 1, milrinone  0.25.  Creatinine 1.15 => 1.4.    Hgb 8.5 today, got 1 unit PRBCs yesterday.   MAP stable 70s.  Weight up post-op. I/Os mildly positive yesterday with Lasix  40 mg IV bid.   NSR in 80s.   Reports sore chest.   Swan numbers: RA 10 PA 41/29 CI 3.7 Co-ox 58%  LVAD Interrogation HM III: Speed: 5700 Flow: 4.4 PI: 4.1 Power: 4.2.  No alarms.   Objective:    Weight Range: 107 kg Body mass index is 33.85 kg/m.   Vital Signs:   Temp:  [96.8 F (36 C)-98.6 F (37 C)] 98.4 F (36.9 C) (07/05 0700) Pulse Rate:  [64-150] 99 (07/05 0615) Resp:  [14-46] 33 (07/05 0700) SpO2:  [29 %-100 %] 29 % (07/05 0700) Arterial Line BP: (51-111)/(10-87) 97/72 (07/05 0700) FiO2 (%):  [21 %-40 %] 21 % (07/05 0200) Weight:  [107 kg] 107 kg (07/05 0700) Last BM Date : 07/19/23  Weight change: Filed Weights   07/24/23 0611 07/25/23 0515 07/26/23 0700  Weight: (P) 110.5 kg 113.8 kg 107 kg   Intake/Output:  Intake/Output Summary (Last 24 hours) at 07/26/2023 0755 Last data filed at 07/26/2023 0700 Gross per 24 hour  Intake 3093.6 ml  Output 2525 ml  Net 568.6 ml   Physical Exam   Vitals:   07/26/23 0645 07/26/23 0700  BP:    Pulse:    Resp: 19 (!) 33  Temp: 98.4 F (36.9 C) 98.4 F (36.9 C)  SpO2: (!) 47% (!) 29%   General: Well appearing this am. NAD.  HEENT: Normal. Neck:  Supple, JVP 10 cm. Carotids OK.  Cardiac:  Mechanical heart sounds with LVAD hum present.  Lungs:  CTAB, normal effort.  Abdomen:  NT, ND, no HSM. No bruits or masses. +BS  LVAD exit site: Well-healed and incorporated. Dressing dry and intact. No erythema or drainage. Stabilization device present and accurately applied. Driveline dressing changed daily per sterile technique. Extremities:  Warm and dry. No cyanosis, clubbing, rash, or edema.  Neuro:  Alert & oriented x 3. Cranial nerves grossly intact. Moves all 4 extremities w/o difficulty. Affect pleasant    Telemetry   NSR 80s (Personally reviewed)    Labs  CBC Recent Labs    07/25/23 0500 07/25/23 0510 07/25/23 1634 07/25/23 1707 07/26/23 0508 07/26/23 0521 07/26/23 0533  WBC 21.0*   < > 18.2*  --  18.2*  --   --   NEUTROABS 18.9*  --   --   --  15.1*  --   --   HGB 7.5*   < > 7.9*   < > 8.5* 9.9* 9.2*  HCT 22.7*   < > 24.5*   < > 26.7* 29.0* 27.0*  MCV 87.0   < > 86.0  --  86.4  --   --   PLT 105*   < > 128*  --  160  --   --    < > = values in this interval not displayed.   Basic Metabolic Panel Recent Labs    92/95/74 0500 07/25/23 0510 07/25/23 1330 07/25/23 1634 07/25/23 1707 07/26/23 0508 07/26/23 0521 07/26/23 0533  NA 133*   < > 134* 133*   < > 131* 133* 133*  K 3.7   < > 3.9 3.7   < > 4.2 4.2 4.2  CL 99  --  98 99  --  96*  --   --   CO2 26  --  24 24  --  23  --   --   GLUCOSE 122*  --  149* 142*  --  118*  --   --   BUN 24*  --  24* 24*  --  27*  --   --   CREATININE 1.15*  --  1.09* 1.22*  --  1.42*  --   --   CALCIUM  8.8*  --  8.8* 8.8*  --  9.1  --   --   MG 2.2  --  2.0  --   --  1.9  --   --   PHOS 4.2  --   --   --   --  5.4*  --   --    < > = values in this interval not displayed.   Liver Function Tests Recent Labs    07/25/23 0500 07/26/23 0508  AST 91* 81*  ALT 22 16  ALKPHOS 50 56  BILITOT 2.2* 2.5*  PROT 5.8* 6.2*  ALBUMIN  2.6* 2.7*   BNP (last 3 results) Recent Labs     06/30/23 1218 07/04/23 1057 07/25/23 0500  BNP 600.6* 612.0* 938.1*   Fasting Lipid Panel No results for input(s): CHOL, HDL, LDLCALC, TRIG, CHOLHDL, LDLDIRECT in the last 72 hours.  Medications:   Scheduled Medications:  sodium chloride    Intravenous Once   acetaminophen   1,000 mg Oral Q6H   Or   acetaminophen  (TYLENOL ) oral liquid 160 mg/5 mL  1,000 mg Per Tube Q6H   amiodarone   200 mg Oral BID   aspirin  EC  325 mg Oral Daily   Or   aspirin   324 mg Per Tube Daily   Or   aspirin   300 mg Rectal Daily   bisacodyl   10 mg Oral Daily   Or   bisacodyl   10 mg Rectal Daily   Chlorhexidine  Gluconate Cloth  6 each Topical Daily   docusate sodium   200 mg Oral Daily   enoxaparin  (LOVENOX ) injection  40 mg Subcutaneous QHS   Fe Fum-Vit C-Vit B12-FA  1 capsule Oral QPC breakfast   feeding supplement  237 mL Oral TID BM   gabapentin   200 mg Per Tube QHS   insulin  aspart  0-24 Units Subcutaneous Q4H   metoCLOPramide  (REGLAN ) injection  10 mg Intravenous Q6H   pantoprazole   40 mg Oral Daily   sodium chloride  flush  3 mL Intravenous Q12H   warfarin  5 mg Oral ONCE-1600   Warfarin - Physician Dosing Inpatient   Does not apply q1600   Infusions:   ceFAZolin  (ANCEF ) IV Stopped (07/26/23 0552)   epinephrine  2 mcg/min (07/26/23 0700)   milrinone  0.25 mcg/kg/min (07/26/23 0700)   norepinephrine  (LEVOPHED ) Adult infusion Stopped (07/26/23 0522)   PRN Medications: morphine  injection, ondansetron  (ZOFRAN ) IV, mouth rinse, oxyCODONE , sodium chloride  flush, traMADol   Patient Profile   48 y.o. female with history of chronic systolic HF with biventricular  dysfunction on home inotrope, severe MR, hx PE/DVT, obesity, CAD, anemia, tobacco use. Presented for mTEER on 07/09/23. Worsening MR leading to cardiogenic shock. S/p LVAD 07/24/23.   Assessment/Plan  Cardiogenic shock>>>now S/P HM III LVAD 07/24/23: due to torrential MR s/p mTEER complicated by severely restricted posterior leaflet  that was damaged during repair attempt, IABP and nipride  needed to stabilize. Transitioned to Impella 5.5. Not a transplant candidate with tobacco use.  Nonobstructive CAD on cath 2023.  - Limited echo 7/1: LV systolic function did look improved from prior in 30-35% range, there is thinning/akinesis of the inferolateral wall, mild-moderate RV dysfunction, severe MR, dilated IVC.   - Reviewing prior echoes, the inferolateral wall has been akinetic long-term.  Cath in 5/23 showed mild nonobstructive CAD. Patient has never had cardiac MRI.  - 7/3 HM 3 + bioprosthetic MV replacement.  - LVAD parameters stable today.  - Currently on Epi 1, milrinone  0.25. CI 3.9 thermo, co-ox 58%.  Will continue current epinephrine  and milrinone  today while diuresing, probably stop epinephrine  tomorrow morning.  - CVP 10 today, I/Os mildly positive yesterday.  Creatinine 1.4.  I will give Lasix  60 mg IV bid today.    HM 3 LVAD - Stable parameters today.  - Start warfarin tonight for LVAD.  - Removing mediastinal drains today.    Torrential MR - S/p bioprosthetic MVR.    Acute hypoxic respiratory failure - Now extubated post-op.    Hx of PE/DVT w/ IVC filter - Evaluated at [facility] where thrombophilia work-up was negative (Lupus inhibitor, anticardiolipin antibodies, and anti beta-2  glycoprotein antibodies were negative; protein C activity 85%, protein S activity 76%, factor V Leiden, and prothrombin gene mutation ) - IVC filter in place; could not be removed due to significant thrombus burden on filter at [facility]  - Starting warfarin tonight.    CAD - LHC in 5/23 w/ nonobstructive CAD involving RCA - No aspirin  with need for anticoagulation.  Tobacco use - Smoking 3 cigarettes/day at time of last follow-up.  Anemia  - Transfuse for HGB < 8. 8.5 today.    PVCs - Amiodarone  200 mg bid currently.   AKI - Creatinine 1.4, same as pre-op.   Length of Stay: 17  Ezra Shuck, MD  07/26/2023, 7:55 AM  Advanced  Heart Failure Team Pager [phone] (M-F; 7a - 5p)  Please contact [facility] for night-coverage after hours (5p -7a ) and weekends on [email or web address]   CRITICAL CARE Performed by: Ezra Shuck  Total critical care time: 45 minutes  Critical care time was exclusive of separately billable procedures and treating other patients.  Critical care was necessary to treat or prevent imminent or life-threatening deterioration.  Critical care was time spent personally by me on the following activities: development of treatment plan with patient and/or surrogate as well as nursing, discussions with consultants, evaluation of patient's response to treatment, examination of patient, obtaining history from patient or surrogate, ordering and performing treatments and interventions, ordering and review of laboratory studies, ordering and review of radiographic studies, pulse oximetry and re-evaluation of patient's condition.  Ezra Shuck 07/26/2023 7:55 AM

## 2023-07-26 NOTE — Evaluation (Signed)
 Occupational Therapy Re-Evaluation Patient Details Name: [name] [name] MRN: [id number] DOB: 24-Jun-1975 Today's Date: 07/26/2023   History of Present Illness   48 yo presented 07/09/23 for transcatheter mitral valve repair with Mitraclip. Post-op severe residual MR and cardiogenic shock. 6/19 impella inserted; extubated 6/20;. S/p HM III LVAD and bioprosthetic MVR 7/3.  PMH-chronic HF (on home milrinone ); CAD, DVT, fibroid tumor, hidradenitis, cardiomyopathy, NSVT, PE, obesity     Clinical Impressions Pt reevaluated s/p LVAD. Moving remarkably well, CGA +2 safety/lines for bed mobility and sit to stand from bed and from recliner. Pt requires set up to total assist for ADLs. Reinforced sternal precautions, use of heart pillow. Anticipate she will progress well and be able to return home with HHOT.      If plan is discharge home, recommend the following:   A little help with walking and/or transfers;A little help with bathing/dressing/bathroom;Assistance with cooking/housework;Assist for transportation     Functional Status Assessment   Patient has had a recent decline in their functional status and demonstrates the ability to make significant improvements in function in a reasonable and predictable amount of time.     Equipment Recommendations   BSC/3in1     Recommendations for Other Services         Precautions/Restrictions   Precautions Precautions: Sternal;Fall;Other (comment) Recall of Precautions/Restrictions: Intact Precaution/Restrictions Comments: LVAD Restrictions Weight Bearing Restrictions Per Provider Order: No     Mobility Bed Mobility Overal bed mobility: Needs Assistance Bed Mobility: Supine to Sit     Supine to sit: HOB elevated, Contact guard, +2 for safety/equipment     General bed mobility comments: CGA +2 for safety/equipment use. Cues for technique, used heart pillow for comfort.    Transfers Overall transfer level: Needs  assistance Equipment used: None Transfers: Sit to/from Stand Sit to Stand: Contact guard assist, +2 safety/equipment           General transfer comment: CGA for safety +2 for equipment management. Cues for technique, hand placement holding heart pillow for comfort. Slow to rise but stable without physical assist to boost. Performed from bed and recliner x2.      Balance Overall balance assessment: Needs assistance   Sitting balance-Leahy Scale: Good     Standing balance support: During functional activity, No upper extremity supported Standing balance-Leahy Scale: Fair Standing balance comment: no UB support                           ADL either performed or assessed with clinical judgement   ADL Overall ADL's : Needs assistance/impaired Eating/Feeding: Bed level;Independent   Grooming: Set up;Sitting   Upper Body Bathing: Minimal assistance;Sitting   Lower Body Bathing: Sit to/from stand;Total assistance   Upper Body Dressing : Moderate assistance;Sitting   Lower Body Dressing: Total assistance;Sit to/from stand   Toilet Transfer: Contact guard assist;Stand-pivot   Toileting- Clothing Manipulation and Hygiene: Total assistance;Sit to/from stand               Vision Baseline Vision/History: 1 Wears glasses Ability to See in Adequate Light: 0 Adequate Patient Visual Report: No change from baseline       Perception         Praxis         Pertinent Vitals/Pain Pain Assessment Pain Assessment: Faces Faces Pain Scale: Hurts little more Pain Location: sternum Pain Descriptors / Indicators: Guarding, Sore Pain Intervention(s): Monitored during session, Premedicated before session, Repositioned  Extremity/Trunk Assessment Upper Extremity Assessment Upper Extremity Assessment: Generalized weakness;Right hand dominant   Lower Extremity Assessment Lower Extremity Assessment: Defer to PT evaluation   Cervical / Trunk Assessment Cervical /  Trunk Assessment: Normal   Communication Communication Communication: Impaired Factors Affecting Communication: Reduced clarity of speech (low volume)   Cognition Arousal: Alert Behavior During Therapy: WFL for tasks assessed/performed Cognition: No apparent impairments                               Following commands: Intact       Cueing  General Comments   Cueing Techniques: Verbal cues  LVAD: Pump flow 4.4, PI 4.2, Pump Power 4.2. HR 83. SpO2 unreadable but no overt dyspnea.   Exercises     Shoulder Instructions      Home Living Family/patient expects to be discharged to:: Private residence Living Arrangements: Parent;Children Available Help at Discharge: Family;Available PRN/intermittently Type of Home: Mobile home Home Access: Stairs to enter Entrance Stairs-Number of Steps: 3 Entrance Stairs-Rails: Right;Left;Can reach both Home Layout: One level     Bathroom Shower/Tub: Tub/shower unit;Walk-in shower   Bathroom Toilet: Standard Bathroom Accessibility: No   Home Equipment: None   Additional Comments: Daughter is currently staying with pt's aunt.      Prior Functioning/Environment Prior Level of Function : Independent/Modified Independent             Mobility Comments: not working--on disability ADLs Comments: No assist with ADL or light iADL    OT Problem List: Decreased activity tolerance;Decreased strength;Decreased knowledge of precautions;Pain;Impaired UE functional use   OT Treatment/Interventions: Self-care/ADL training;Therapeutic activities;Patient/family education;DME and/or AE instruction;Balance training;Energy conservation      OT Goals(Current goals can be found in the care plan section)   Acute Rehab OT Goals OT Goal Formulation: With patient Time For Goal Achievement: 08/09/23 Potential to Achieve Goals: Good ADL Goals Pt Will Perform Grooming: with modified independence;standing Pt Will Perform Upper Body  Dressing: with modified independence;sitting Pt Will Perform Lower Body Dressing: with modified independence;sit to/from stand Pt Will Transfer to Toilet: with modified independence;ambulating;bedside commode Pt Will Perform Toileting - Clothing Manipulation and hygiene: with modified independence;sit to/from stand Additional ADL Goal #1: Pt will manage LVAD equipment independently. Additional ADL Goal #2: Pt will adhere to sternal precautions during ADLs and mobility.   OT Frequency:  Min 2X/week    Co-evaluation   Reason for Co-Treatment: Complexity of the patient's impairments (multi-system involvement);For patient/therapist safety;To address functional/ADL transfers PT goals addressed during session: Mobility/safety with mobility;Balance;Strengthening/ROM OT goals addressed during session: ADL's and self-care      AM-PAC OT 6 Clicks Daily Activity     Outcome Measure Help from another person eating meals?: None Help from another person taking care of personal grooming?: A Little Help from another person toileting, which includes using toliet, bedpan, or urinal?: Total Help from another person bathing (including washing, rinsing, drying)?: A Lot Help from another person to put on and taking off regular upper body clothing?: A Lot Help from another person to put on and taking off regular lower body clothing?: Total 6 Click Score: 13   End of Session Equipment Utilized During Treatment: Oxygen Nurse Communication: Mobility status  Activity Tolerance: Patient tolerated treatment well Patient left: in chair;with call bell/phone within reach;with nursing/sitter in room  OT Visit Diagnosis: Muscle weakness (generalized) (M62.81);Unsteadiness on feet (R26.81);Pain  Time: 8640-8564 OT Time Calculation (min): 36 min Charges:  OT General Charges $OT Visit: 1 Visit OT Evaluation $OT Re-eval: 1 Re-eval  Mliss HERO, OTR/L Acute Rehabilitation Services Office:  [phone]   Kennth Mliss Helling 07/26/2023, 4:25 PM

## 2023-07-27 ENCOUNTER — Inpatient Hospital Stay (HOSPITAL_COMMUNITY)

## 2023-07-27 DIAGNOSIS — R570 Cardiogenic shock: Secondary | ICD-10-CM | POA: Diagnosis not present

## 2023-07-27 DIAGNOSIS — Z9889 Other specified postprocedural states: Secondary | ICD-10-CM

## 2023-07-27 DIAGNOSIS — Z952 Presence of prosthetic heart valve: Secondary | ICD-10-CM

## 2023-07-27 DIAGNOSIS — Z95828 Presence of other vascular implants and grafts: Secondary | ICD-10-CM

## 2023-07-27 DIAGNOSIS — I5023 Acute on chronic systolic (congestive) heart failure: Secondary | ICD-10-CM | POA: Diagnosis not present

## 2023-07-27 LAB — BPAM RBC
Blood Product Expiration Date: 202507152359
Blood Product Expiration Date: 202507232359
Blood Product Expiration Date: 202507232359
Blood Product Expiration Date: 202508022359
Blood Product Expiration Date: 202508022359
Blood Product Expiration Date: 202508022359
Blood Product Expiration Date: 202508022359
Blood Product Expiration Date: 202508022359
ISSUE DATE / TIME: 202507030734
ISSUE DATE / TIME: 202507030734
ISSUE DATE / TIME: 202507030734
ISSUE DATE / TIME: 202507031807
ISSUE DATE / TIME: 202507040829
ISSUE DATE / TIME: 202507042036
Unit Type and Rh: 6200
Unit Type and Rh: 6200
Unit Type and Rh: 6200
Unit Type and Rh: 6200
Unit Type and Rh: 6200
Unit Type and Rh: 6200
Unit Type and Rh: 6200
Unit Type and Rh: 6200

## 2023-07-27 LAB — TYPE AND SCREEN
ABO/RH(D): A POS
Antibody Screen: NEGATIVE
Unit division: 0
Unit division: 0
Unit division: 0
Unit division: 0
Unit division: 0
Unit division: 0
Unit division: 0
Unit division: 0

## 2023-07-27 LAB — COMPREHENSIVE METABOLIC PANEL WITH GFR
ALT: 11 U/L (ref 0–44)
AST: 44 U/L — ABNORMAL HIGH (ref 15–41)
Albumin: 2.5 g/dL — ABNORMAL LOW (ref 3.5–5.0)
Alkaline Phosphatase: 59 U/L (ref 38–126)
Anion gap: 11 (ref 5–15)
BUN: 33 mg/dL — ABNORMAL HIGH (ref 6–20)
CO2: 26 mmol/L (ref 22–32)
Calcium: 9 mg/dL (ref 8.9–10.3)
Chloride: 96 mmol/L — ABNORMAL LOW (ref 98–111)
Creatinine, Ser: 1.41 mg/dL — ABNORMAL HIGH (ref 0.44–1.00)
GFR, Estimated: 47 mL/min — ABNORMAL LOW (ref >60)
Glucose, Bld: 90 mg/dL (ref 70–99)
Potassium: 3.4 mmol/L — ABNORMAL LOW (ref 3.5–5.1)
Sodium: 133 mmol/L — ABNORMAL LOW (ref 135–145)
Total Bilirubin: 1.5 mg/dL — ABNORMAL HIGH (ref 0.0–1.2)
Total Protein: 6.3 g/dL — ABNORMAL LOW (ref 6.5–8.1)

## 2023-07-27 LAB — POCT I-STAT 7, (LYTES, BLD GAS, ICA,H+H)
Acid-Base Excess: 1 mmol/L (ref 0.0–2.0)
Bicarbonate: 25.8 mmol/L (ref 20.0–28.0)
Calcium, Ion: 1.23 mmol/L (ref 1.15–1.40)
HCT: 25 % — ABNORMAL LOW (ref 36.0–46.0)
Hemoglobin: 8.5 g/dL — ABNORMAL LOW (ref 12.0–15.0)
O2 Saturation: 92 %
Patient temperature: 36.6
Potassium: 3.5 mmol/L (ref 3.5–5.1)
Sodium: 134 mmol/L — ABNORMAL LOW (ref 135–145)
TCO2: 27 mmol/L (ref 22–32)
pCO2 arterial: 39.7 mmHg (ref 32–48)
pH, Arterial: 7.419 (ref 7.35–7.45)
pO2, Arterial: 62 mmHg — ABNORMAL LOW (ref 83–108)

## 2023-07-27 LAB — BASIC METABOLIC PANEL WITH GFR
Anion gap: 11 (ref 5–15)
Anion gap: 9 (ref 5–15)
BUN: 34 mg/dL — ABNORMAL HIGH (ref 6–20)
BUN: 34 mg/dL — ABNORMAL HIGH (ref 6–20)
CO2: 23 mmol/L (ref 22–32)
CO2: 29 mmol/L (ref 22–32)
Calcium: 9 mg/dL (ref 8.9–10.3)
Calcium: 8.9 mg/dL (ref 8.9–10.3)
Chloride: 95 mmol/L — ABNORMAL LOW (ref 98–111)
Chloride: 98 mmol/L (ref 98–111)
Creatinine, Ser: 1.29 mg/dL — ABNORMAL HIGH (ref 0.44–1.00)
Creatinine, Ser: 1.18 mg/dL — ABNORMAL HIGH (ref 0.44–1.00)
GFR, Estimated: 52 mL/min — ABNORMAL LOW (ref >60)
GFR, Estimated: 58 mL/min — ABNORMAL LOW (ref >60)
Glucose, Bld: 124 mg/dL — ABNORMAL HIGH (ref 70–99)
Glucose, Bld: 99 mg/dL (ref 70–99)
Potassium: 3.7 mmol/L (ref 3.5–5.1)
Potassium: 3.7 mmol/L (ref 3.5–5.1)
Sodium: 129 mmol/L — ABNORMAL LOW (ref 135–145)
Sodium: 136 mmol/L (ref 135–145)

## 2023-07-27 LAB — LACTATE DEHYDROGENASE: LDH: 480 U/L — ABNORMAL HIGH (ref 98–192)

## 2023-07-27 LAB — GLUCOSE, CAPILLARY
Glucose-Capillary: 37 mg/dL — CL (ref 70–99)
Glucose-Capillary: 98 mg/dL (ref 70–99)
Glucose-Capillary: 107 mg/dL — ABNORMAL HIGH (ref 70–99)
Glucose-Capillary: 82 mg/dL (ref 70–99)
Glucose-Capillary: 102 mg/dL — ABNORMAL HIGH (ref 70–99)
Glucose-Capillary: 113 mg/dL — ABNORMAL HIGH (ref 70–99)
Glucose-Capillary: 94 mg/dL (ref 70–99)

## 2023-07-27 LAB — CBC WITH DIFFERENTIAL/PLATELET
Abs Immature Granulocytes: 0.06 K/uL (ref 0.00–0.07)
Basophils Absolute: 0.1 K/uL (ref 0.0–0.1)
Basophils Relative: 1 %
Eosinophils Absolute: 0.3 K/uL (ref 0.0–0.5)
Eosinophils Relative: 2 %
HCT: 25.9 % — ABNORMAL LOW (ref 36.0–46.0)
Hemoglobin: 8.1 g/dL — ABNORMAL LOW (ref 12.0–15.0)
Immature Granulocytes: 0 %
Lymphocytes Relative: 4 %
Lymphs Abs: 0.6 K/uL — ABNORMAL LOW (ref 0.7–4.0)
MCH: 27.1 pg (ref 26.0–34.0)
MCHC: 31.3 g/dL (ref 30.0–36.0)
MCV: 86.6 fL (ref 80.0–100.0)
Monocytes Absolute: 1.7 K/uL — ABNORMAL HIGH (ref 0.1–1.0)
Monocytes Relative: 11 %
Neutro Abs: 12.2 K/uL — ABNORMAL HIGH (ref 1.7–7.7)
Neutrophils Relative %: 82 %
Platelets: 182 K/uL (ref 150–400)
RBC: 2.99 MIL/uL — ABNORMAL LOW (ref 3.87–5.11)
RDW: 22.1 % — ABNORMAL HIGH (ref 11.5–15.5)
WBC: 14.9 K/uL — ABNORMAL HIGH (ref 4.0–10.5)
nRBC: 0 % (ref 0.0–0.2)

## 2023-07-27 LAB — PROTIME-INR
INR: 1.5 — ABNORMAL HIGH (ref 0.8–1.2)
Prothrombin Time: 19.2 s — ABNORMAL HIGH (ref 11.4–15.2)

## 2023-07-27 LAB — COOXEMETRY PANEL
Carboxyhemoglobin: 1.9 % — ABNORMAL HIGH (ref 0.5–1.5)
Carboxyhemoglobin: 3.8 % — ABNORMAL HIGH (ref 0.5–1.5)
Methemoglobin: <0.7 % (ref 0.0–1.5)
Methemoglobin: 1.3 % (ref 0.0–1.5)
O2 Saturation: 54.4 %
O2 Saturation: 75.7 %
Total hemoglobin: 8.6 g/dL — ABNORMAL LOW (ref 12.0–16.0)
Total hemoglobin: 8.3 g/dL — ABNORMAL LOW (ref 12.0–16.0)

## 2023-07-27 LAB — PHOSPHORUS: Phosphorus: 4.8 mg/dL — ABNORMAL HIGH (ref 2.5–4.6)

## 2023-07-27 LAB — MAGNESIUM: Magnesium: 2.1 mg/dL (ref 1.7–2.4)

## 2023-07-27 MED ORDER — POTASSIUM CHLORIDE CRYS ER 20 MEQ PO TBCR
40.0000 meq | EXTENDED_RELEASE_TABLET | Freq: Once | ORAL | Status: AC
  Administered 2023-07-27: 40 meq via ORAL
  Filled 2023-07-27: qty 2

## 2023-07-27 MED ORDER — FUROSEMIDE 10 MG/ML IJ SOLN
40.0000 mg | Freq: Two times a day (BID) | INTRAMUSCULAR | Status: AC
  Administered 2023-07-27: 40 mg via INTRAVENOUS

## 2023-07-27 MED ORDER — POTASSIUM CHLORIDE 10 MEQ/50ML IV SOLN
10.0000 meq | INTRAVENOUS | Status: AC
  Administered 2023-07-27 (×4): 10 meq via INTRAVENOUS
  Filled 2023-07-27: qty 50

## 2023-07-27 MED ORDER — WARFARIN SODIUM 5 MG PO TABS
5.0000 mg | ORAL_TABLET | Freq: Once | ORAL | Status: AC
  Administered 2023-07-27: 5 mg via ORAL
  Filled 2023-07-27: qty 1

## 2023-07-27 MED ORDER — FUROSEMIDE 10 MG/ML IJ SOLN
80.0000 mg | Freq: Two times a day (BID) | INTRAMUSCULAR | Status: DC
Start: 2023-07-27 — End: 2023-07-27
  Administered 2023-07-27: 80 mg via INTRAVENOUS
  Filled 2023-07-27 (×2): qty 8

## 2023-07-27 MED ORDER — ASPIRIN 81 MG PO TBEC
81.0000 mg | DELAYED_RELEASE_TABLET | Freq: Every day | ORAL | Status: DC
  Administered 2023-07-27 – 2023-07-31 (×5): 81 mg via ORAL
  Filled 2023-07-27 (×5): qty 1

## 2023-07-27 MED ORDER — PHENOL 1.4 % MT LIQD
1.0000 | OROMUCOSAL | Status: DC | PRN
  Administered 2023-07-27: 1 via OROMUCOSAL
  Filled 2023-07-27: qty 177

## 2023-07-27 MED ORDER — LACTULOSE 10 GM/15ML PO SOLN
20.0000 g | Freq: Once | ORAL | Status: AC
  Administered 2023-07-27: 20 g via ORAL
  Filled 2023-07-27: qty 30

## 2023-07-27 MED ORDER — POTASSIUM CHLORIDE CRYS ER 10 MEQ PO TBCR
20.0000 meq | EXTENDED_RELEASE_TABLET | ORAL | Status: AC
  Administered 2023-07-27 – 2023-07-28 (×3): 20 meq via ORAL
  Filled 2023-07-27 (×3): qty 2

## 2023-07-27 MED ORDER — INSULIN ASPART 100 UNIT/ML IJ SOLN: 0.0000 [IU] | Freq: Three times a day (TID) | INTRAMUSCULAR | Status: DC

## 2023-07-27 MED ORDER — POTASSIUM CHLORIDE 10 MEQ/50ML IV SOLN
10.0000 meq | INTRAVENOUS | Status: DC
  Administered 2023-07-27 (×2): 10 meq via INTRAVENOUS
  Filled 2023-07-27 (×2): qty 50

## 2023-07-27 MED ORDER — POTASSIUM CHLORIDE CRYS ER 20 MEQ PO TBCR: 20.0000 meq | EXTENDED_RELEASE_TABLET | ORAL | Status: DC

## 2023-07-27 NOTE — Progress Notes (Signed)
   07/27/23 2352  BiPAP/CPAP/SIPAP  BiPAP/CPAP/SIPAP Pt Type Adult  BiPAP/CPAP/SIPAP Resmed  Mask Type Full face mask  Dentures removed? Not applicable  Mask Size Medium  Respiratory Rate 22 breaths/min  EPAP 11 cmH2O (CPAP pressure 11)  Flow Rate 4 lpm (4L bleed in)  Patient Home Machine No  Patient Home Mask No  Patient Home Tubing No  Auto Titrate No  Device Plugged into RED Power Outlet Yes  BiPAP/CPAP /SiPAP Vitals  Pulse Rate 75  Resp (!) 22  SpO2 100 %  Bilateral Breath Sounds Clear;Diminished

## 2023-07-27 NOTE — Progress Notes (Signed)
 Patient ID: [name] [name], female   DOB: [birth year], 48 y.o.   MRN: [id number]     Advanced Heart Failure Rounding Note  Cardiologist: Ria Commander, DO  Chief Complaint: Cardiogenic Shock Subjective:    6/17: mTEER with sev posterior leaflet restriction resulting in tear. IABP + PAC. PCWP 40, v up to 60 6/24 : Aggressive diuresed. Started on lasix  drip. Brisk diuresis noted.  6/30: Echo with EF 25-30%, RV moderately dysfunctional/mildly dilated, Mitraclip present with severe MR, mild-moderate TR, dilated IVC.  7/3: s/p HM III LVAD and bioprosthetic MVR 7/4: Extubated  Currently on Epi 1, milrinone  0.25.  Creatinine 1.15 => 1.4 => 1.4.    Hgb 8.1 today.   MAP stable 70s-80s.  I/Os net negative 2325 on Lasix  60 mg IV bid.   NSR in 80s.   Reports sore chest.   Swan numbers: RA 11-12 PA 41/32 CI 3.6 Co-ox 76%  LVAD Interrogation HM III: Speed: 5700 Flow: 4.5 PI: 4.3 Power: 4.3.  No alarms.   Objective:    Weight Range: 112 kg Body mass index is 35.43 kg/m.   Vital Signs:   Temp:  [91.8 F (33.2 C)-98.2 F (36.8 C)] 97.9 F (36.6 C) (07/06 0612) Pulse Rate:  [70-227] 202 (07/06 0541) Resp:  [15-39] 24 (07/06 0600) BP: (95)/(46) 95/46 (07/05 1300) SpO2:  [21 %-100 %] 80 % (07/06 0541) Arterial Line BP: (70-162)/(52-155) 90/67 (07/06 0641) FiO2 (%):  [32 %] 32 % (07/06 0500) Weight:  [887 kg] 112 kg (07/06 0600) Last BM Date : 07/19/23  Weight change: Filed Weights   07/25/23 0515 07/26/23 0700 07/27/23 0600  Weight: 113.8 kg 107 kg 112 kg   Intake/Output:  Intake/Output Summary (Last 24 hours) at 07/27/2023 0722 Last data filed at 07/27/2023 0600 Gross per 24 hour  Intake 469.67 ml  Output 2795 ml  Net -2325.33 ml   Physical Exam   Vitals:   07/27/23 0611 07/27/23 0612  BP:    Pulse:    Resp:    Temp: 97.9 F (36.6 C) 97.9 F (36.6 C)  SpO2:     General: Well appearing this am. NAD.  HEENT: Normal. Neck: Supple, JVP 10 cm. Carotids OK.   Cardiac:  Mechanical heart sounds with LVAD hum present.  Lungs:  CTAB, normal effort.  Abdomen:  NT, ND, no HSM. No bruits or masses. +BS  LVAD exit site: Well-healed and incorporated. Dressing dry and intact. No erythema or drainage. Stabilization device present and accurately applied. Driveline dressing changed daily per sterile technique. Extremities:  Warm and dry. No cyanosis, clubbing, rash, or edema.  Neuro:  Alert & oriented x 3. Cranial nerves grossly intact. Moves all 4 extremities w/o difficulty. Affect pleasant     Telemetry   NSR 80s (Personally reviewed)    Labs  CBC Recent Labs    07/26/23 0508 07/26/23 0521 07/27/23 0313 07/27/23 0457  WBC 18.2*  --  14.9*  --   NEUTROABS 15.1*  --  12.2*  --   HGB 8.5*   < > 8.1* 8.5*  HCT 26.7*   < > 25.9* 25.0*  MCV 86.4  --  86.6  --   PLT 160  --  182  --    < > = values in this interval not displayed.   Basic Metabolic Panel Recent Labs    92/94/74 0508 07/26/23 0521 07/27/23 0313 07/27/23 0457  NA 131*   < > 133* 134*  K 4.2   < > 3.4*  3.5  CL 96*  --  96*  --   CO2 23  --  26  --   GLUCOSE 118*  --  90  --   BUN 27*  --  33*  --   CREATININE 1.42*  --  1.41*  --   CALCIUM  9.1  --  9.0  --   MG 1.9  --  2.1  --   PHOS 5.4*  --  4.8*  --    < > = values in this interval not displayed.   Liver Function Tests Recent Labs    07/26/23 0508 07/27/23 0313  AST 81* 44*  ALT 16 11  ALKPHOS 56 59  BILITOT 2.5* 1.5*  PROT 6.2* 6.3*  ALBUMIN  2.7* 2.5*   BNP (last 3 results) Recent Labs    06/30/23 1218 07/04/23 1057 07/25/23 0500  BNP 600.6* 612.0* 938.1*   Fasting Lipid Panel No results for input(s): CHOL, HDL, LDLCALC, TRIG, CHOLHDL, LDLDIRECT in the last 72 hours.  Medications:   Scheduled Medications:  sodium chloride    Intravenous Once   acetaminophen   1,000 mg Oral Q6H   Or   acetaminophen  (TYLENOL ) oral liquid 160 mg/5 mL  1,000 mg Per Tube Q6H   amiodarone   200 mg Oral BID    aspirin  EC  81 mg Oral Daily   bisacodyl   10 mg Oral Daily   Or   bisacodyl   10 mg Rectal Daily   Chlorhexidine  Gluconate Cloth  6 each Topical Daily   docusate sodium   200 mg Oral Daily   enoxaparin  (LOVENOX ) injection  40 mg Subcutaneous QHS   Fe Fum-Vit C-Vit B12-FA  1 capsule Oral QPC breakfast   feeding supplement  237 mL Oral TID BM   gabapentin   200 mg Oral QHS   insulin  aspart  0-24 Units Subcutaneous TID WC   lactulose   20 g Oral Once   metoCLOPramide  (REGLAN ) injection  10 mg Intravenous Q6H   pantoprazole   40 mg Oral Daily   polyethylene glycol  17 g Oral Daily   sodium chloride  flush  3 mL Intravenous Q12H   warfarin  5 mg Oral ONCE-1600   Warfarin - Physician Dosing Inpatient   Does not apply q1600   Infusions:  epinephrine  1 mcg/min (07/27/23 0600)   milrinone  0.25 mcg/kg/min (07/27/23 0600)   potassium chloride  50 mL/hr at 07/27/23 0600   PRN Medications: morphine  injection, ondansetron  (ZOFRAN ) IV, mouth rinse, oxyCODONE , sodium chloride  flush, traMADol   Patient Profile   48 y.o. female with history of chronic systolic HF with biventricular dysfunction on home inotrope, severe MR, hx PE/DVT, obesity, CAD, anemia, tobacco use. Presented for mTEER on 07/09/23. Worsening MR leading to cardiogenic shock. S/p LVAD 07/24/23.   Assessment/Plan  Cardiogenic shock>>>now S/P HM III LVAD 07/24/23: due to torrential MR s/p mTEER complicated by severely restricted posterior leaflet that was damaged during repair attempt, IABP and nipride  needed to stabilize. Transitioned to Impella 5.5. Not a transplant candidate with tobacco use.  Nonobstructive CAD on cath 2023.  - Limited echo 7/1: LV systolic function did look improved from prior in 30-35% range, there is thinning/akinesis of the inferolateral wall, mild-moderate RV dysfunction, severe MR, dilated IVC.   - Reviewing prior echoes, the inferolateral wall has been akinetic long-term.  Cath in 5/23 showed mild nonobstructive CAD.  Patient has never had cardiac MRI.  - 7/3 HM 3 + bioprosthetic MV replacement.  - LVAD parameters stable today.  - Currently on Epi 1, milrinone  0.25.  CI 3.6 thermo, co-ox 76%.  Stop epinephrine  today.  If she diureses well, start to wean milrinone  tomorrow.  - OK to remove Swan.  - CVP 11-12 today, I/Os negative.  Creatinine stable 1.4.  I will give Lasix  80 mg IV bid today and will replace K.    HM 3 LVAD - Stable parameters today.  - Continue warfarin, INR 1.5.  - Continue aspirin  81 for 1 month.     Torrential MR - S/p bioprosthetic MVR.    Acute hypoxic respiratory failure - Now extubated post-op.    Hx of PE/DVT w/ IVC filter - Evaluated at [facility] where thrombophilia work-up was negative (Lupus inhibitor, anticardiolipin antibodies, and anti beta-2  glycoprotein antibodies were negative; protein C activity 85%, protein S activity 76%, factor V Leiden, and prothrombin gene mutation ) - IVC filter in place; could not be removed due to significant thrombus burden on filter at [facility]  - Warfarin begun.   CAD - LHC in 5/23 w/ nonobstructive CAD involving RCA  Tobacco use - Smoking 3 cigarettes/day at time of last follow-up.  Anemia  - Transfuse for HGB < 8. 8.1 today.    PVCs - Amiodarone  200 mg bid currently.   AKI - Creatinine 1.4, same as pre-op.   Length of Stay: 61  Ezra Shuck, MD  07/27/2023, 7:22 AM  Advanced Heart Failure Team Pager [phone] (M-F; 7a - 5p)  Please contact [facility] for night-coverage after hours (5p -7a ) and weekends on [email or web address]   CRITICAL CARE Performed by: Ezra Shuck  Total critical care time: 45 minutes  Critical care time was exclusive of separately billable procedures and treating other patients.  Critical care was necessary to treat or prevent imminent or life-threatening deterioration.  Critical care was time spent personally by me on the following activities: development of treatment plan with patient and/or surrogate as  well as nursing, discussions with consultants, evaluation of patient's response to treatment, examination of patient, obtaining history from patient or surrogate, ordering and performing treatments and interventions, ordering and review of laboratory studies, ordering and review of radiographic studies, pulse oximetry and re-evaluation of patient's condition.  Ezra Shuck 07/27/2023 7:22 AM

## 2023-07-27 NOTE — Progress Notes (Signed)
 Patient ID: [name] [name], female   DOB: March 05, 1975, 48 y.o.   MRN: [id number]  TCTS Evening Rounds:  Hemodynamically stable on milrinone  0.25.  -1500 cc so far today after lasix  80 mg. MAP decreased so will give 40 mg this evening. Replace K+  Ambulated to doorway but could not go further due to difficulty with battery connection. Will have VAD coordinator look at it tomorrow.  Bowels working.

## 2023-07-27 NOTE — Progress Notes (Signed)
 Patient ID: [name] [name], female   DOB: 01/28/1975, 48 y.o.   MRN: [id number]  HeartMate 3 Rounding Note  Subjective:    Stable day yesterday. Up in chair this am eating some broth. Had some nausea this am. Passing flatus but no BM yet.  Milrinone  0.25, Epi 1 PA 45/31, CVP 8-10, CI 3.6, Co-ox 76%  -2300 cc yesterday   creat stable at 1.41.  LVAD INTERROGATION:  HeartMate IIl LVAD:  Flow 4.4 liters/min, speed 5700, power 4.4, PI 4.2.    Objective:    Vital Signs:   Temp:  [91.8 F (33.2 C)-98.2 F (36.8 C)] 97.9 F (36.6 C) (07/06 0612) Pulse Rate:  [70-227] 202 (07/06 0541) Resp:  [15-39] 24 (07/06 0600) BP: (95)/(46) 95/46 (07/05 1300) SpO2:  [21 %-100 %] 80 % (07/06 0541) Arterial Line BP: (70-162)/(52-155) 90/67 (07/06 0641) FiO2 (%):  [32 %] 32 % (07/06 0500) Weight:  [887 kg] 112 kg (07/06 0600) Last BM Date : 07/19/23 Mean arterial Pressure 70's  Intake/Output:   Intake/Output Summary (Last 24 hours) at 07/27/2023 0721 Last data filed at 07/27/2023 0600 Gross per 24 hour  Intake 469.67 ml  Output 2795 ml  Net -2325.33 ml     Physical Exam: General:  Well appearing. No resp difficulty HEENT: normal Neck: normal Cor: Distant heart sounds with LVAD hum present. Lungs: clear Abdomen: soft, nontender, nondistended. Good bowel sounds. Extremities:  edema in lower legs and feet. Neuro: alert & orientedx3, cranial nerves grossly intact. moves all 4 extremities w/o difficulty. Affect pleasant  Telemetry: sinus 80's  Labs: Basic Metabolic Panel: Recent Labs  Lab 07/24/23 2200 07/24/23 2207 07/25/23 0500 07/25/23 0510 07/25/23 1330 07/25/23 1634 07/25/23 1707 07/26/23 0508 07/26/23 0521 07/26/23 0533 07/27/23 0313 07/27/23 0457  NA 133*   < > 133*   < > 134* 133*   < > 131* 133* 133* 133* 134*  K 4.4   < > 3.7   < > 3.9 3.7   < > 4.2 4.2 4.2 3.4* 3.5  CL 100  --  99  --  98 99  --  96*  --   --  96*  --   CO2 23  --  26  --  24 24  --  23  --   --   26  --   GLUCOSE 136*  --  122*  --  149* 142*  --  118*  --   --  90  --   BUN 27*  --  24*  --  24* 24*  --  27*  --   --  33*  --   CREATININE 1.51*  --  1.15*  --  1.09* 1.22*  --  1.42*  --   --  1.41*  --   CALCIUM  8.5*  --  8.8*  --  8.8* 8.8*  --  9.1  --   --  9.0  --   MG 2.8*  --  2.2  --  2.0  --   --  1.9  --   --  2.1  --   PHOS  --   --  4.2  --   --   --   --  5.4*  --   --  4.8*  --    < > = values in this interval not displayed.    Liver Function Tests: Recent Labs  Lab 07/23/23 0432 07/25/23 0500 07/26/23 0508 07/27/23 0313  AST 33 91* 81*  44*  ALT 14 22 16 11   ALKPHOS 74 50 56 59  BILITOT 1.2 2.2* 2.5* 1.5*  PROT 7.6 5.8* 6.2* 6.3*  ALBUMIN  2.7* 2.6* 2.7* 2.5*   No results for input(s): LIPASE, AMYLASE in the last 168 hours. No results for input(s): AMMONIA in the last 168 hours.  CBC: Recent Labs  Lab 07/25/23 0500 07/25/23 0510 07/25/23 1330 07/25/23 1634 07/25/23 1707 07/26/23 0508 07/26/23 0521 07/26/23 0533 07/27/23 0313 07/27/23 0457  WBC 21.0*  --  18.1* 18.2*  --  18.2*  --   --  14.9*  --   NEUTROABS 18.9*  --   --   --   --  15.1*  --   --  12.2*  --   HGB 7.5*   < > 7.7* 7.9*   < > 8.5* 9.9* 9.2* 8.1* 8.5*  HCT 22.7*   < > 23.6* 24.5*   < > 26.7* 29.0* 27.0* 25.9* 25.0*  MCV 87.0  --  86.1 86.0  --  86.4  --   --  86.6  --   PLT 105*  --  111* 128*  --  160  --   --  182  --    < > = values in this interval not displayed.    INR: Recent Labs  Lab 07/24/23 1545 07/25/23 0500 07/26/23 0508 07/27/23 0313  INR 1.6*  1.6* 1.4* 1.4* 1.5*    Other results: EKG:   Imaging: DG Chest Port 1 View Result Date: 07/26/2023 CLINICAL DATA:  LVAD.  History of severe mitral regurgitation. EXAM: PORTABLE CHEST 1 VIEW COMPARISON:  07/25/2023 FINDINGS: Interval removal of the ET tube and enteric tube. Left IJ pulmonary arterial catheter is identified with tip in the right main pulmonary artery. Held bad device is identified over the left  lower chest. Bilateral chest tubes and mediastinal drains are stable in position. No significant pneumothorax identified. Persistent retrocardiac opacification. Atelectasis is identified in the right base which is new from previous exam. Unchanged pulmonary vascular congestion. IMPRESSION: 1. Interval removal of ET tube and enteric tube. 2. New right base atelectasis. 3. Unchanged retrocardiac opacification and pulmonary vascular congestion. Electronically Signed   By: Waddell Calk M.D.   On: 07/26/2023 07:50     Medications:     Scheduled Medications:  sodium chloride    Intravenous Once   acetaminophen   1,000 mg Oral Q6H   Or   acetaminophen  (TYLENOL ) oral liquid 160 mg/5 mL  1,000 mg Per Tube Q6H   amiodarone   200 mg Oral BID   aspirin  EC  81 mg Oral Daily   bisacodyl   10 mg Oral Daily   Or   bisacodyl   10 mg Rectal Daily   Chlorhexidine  Gluconate Cloth  6 each Topical Daily   docusate sodium   200 mg Oral Daily   enoxaparin  (LOVENOX ) injection  40 mg Subcutaneous QHS   Fe Fum-Vit C-Vit B12-FA  1 capsule Oral QPC breakfast   feeding supplement  237 mL Oral TID BM   gabapentin   200 mg Oral QHS   insulin  aspart  0-24 Units Subcutaneous TID WC   lactulose   20 g Oral Once   metoCLOPramide  (REGLAN ) injection  10 mg Intravenous Q6H   pantoprazole   40 mg Oral Daily   polyethylene glycol  17 g Oral Daily   sodium chloride  flush  3 mL Intravenous Q12H   warfarin  5 mg Oral ONCE-1600   Warfarin - Physician Dosing Inpatient   Does not apply q1600  Infusions:  epinephrine  1 mcg/min (07/27/23 0600)   milrinone  0.25 mcg/kg/min (07/27/23 0600)   potassium chloride  50 mL/hr at 07/27/23 0600    PRN Medications: morphine  injection, ondansetron  (ZOFRAN ) IV, mouth rinse, oxyCODONE , sodium chloride  flush, traMADol    Assessment:   POD 3 HeartMate 3 LVAD as destination therapy, removal of Impella and mitral valve replacement with 33 mm Medtronic Mosaic porcine valve. Noted to have  acute/subacute pericarditis with inflammatory exudate over heart and pericardium.   Hx of stage D non-ischemic cardiomyopathy and chronic systolic heart failure with biventricular dysfunction and severe MR on home milrinone . Failed mTEER with torrential MR and severe pulmonary hypertension, acute hypoxic respiratory failure, cardiogenic shock requiring IABP and then left subclavian Impella 5.5 for stabilization.    Hx of DVT/PE with IVC filter in past, thrombophilia workup negative at [facility]. Filter could not be removed due to thrombus burden on filter.   Hx of smoking and COPD by PFT's preop with FEV 1 of 1.09 and FVC of 1.43 but probably affected by heart failure and severe pulm htn from MR. She was walking around the ICU with Impella so I suspect her pulmonary function will not be an issue.   Morbid obesity with BMI 36. She was moving around well preop with severe MR/severe pulmonary HTN and Impella.   Malnutrition with albumin  2.6 preop although she was eating well recently.   Expected postop blood loss anemia  Plan/Discussion:    She is doing well on low dose inotropes. Plan per AHF team.  Chest tube output low. Will remove pleural tubes and pocket drain.  INR 1.5. Coumadin  5 mg tonight.  Still has some edema. Wt is 3 lbs over preop which was still a wet weight. Continue diuresis and replace K+.  IS. OOB, start ambulation.   I reviewed the LVAD parameters from today, and compared the results to the patient's prior recorded data.  No programming changes were made.  The LVAD is functioning within specified parameters.  LVAD interrogation was negative for any significant power changes, alarms or PI events/speed drops.  LVAD equipment check completed and is in good working order.  Back-up equipment present.     Length of Stay: [address]  Dorise POUR [facility] 07/27/2023, 7:21 AM

## 2023-07-27 NOTE — Progress Notes (Signed)
 Drive Line: Existing VAD dressing removed and site care performed using sterile technique. Drive line exit site cleaned with Chlora prep applicators x 2, allowed to dry, and gauze dressing with silver strip applied to exit site. Exit site healing and unincorporated, the velour is fully implanted at exit site with sutures in place. Minimal serosanguinous drainage. No redness, tenderness, foul odor or rash noted. Drive line anchor secured. Next dressing change 07/28/23 by VAD Coordinator or nurse champion.

## 2023-07-28 ENCOUNTER — Inpatient Hospital Stay (HOSPITAL_COMMUNITY)

## 2023-07-28 ENCOUNTER — Encounter (HOSPITAL_COMMUNITY): Payer: Self-pay | Admitting: Surgery

## 2023-07-28 DIAGNOSIS — Z95828 Presence of other vascular implants and grafts: Secondary | ICD-10-CM

## 2023-07-28 DIAGNOSIS — D649 Anemia, unspecified: Secondary | ICD-10-CM

## 2023-07-28 DIAGNOSIS — Z952 Presence of prosthetic heart valve: Secondary | ICD-10-CM

## 2023-07-28 DIAGNOSIS — Z9889 Other specified postprocedural states: Secondary | ICD-10-CM

## 2023-07-28 DIAGNOSIS — R570 Cardiogenic shock: Secondary | ICD-10-CM | POA: Diagnosis not present

## 2023-07-28 LAB — CBC WITH DIFFERENTIAL/PLATELET
Abs Immature Granulocytes: 0.06 K/uL (ref 0.00–0.07)
Basophils Absolute: 0.1 K/uL (ref 0.0–0.1)
Basophils Relative: 1 %
Eosinophils Absolute: 0.5 K/uL (ref 0.0–0.5)
Eosinophils Relative: 4 %
HCT: 24.6 % — ABNORMAL LOW (ref 36.0–46.0)
Hemoglobin: 7.5 g/dL — ABNORMAL LOW (ref 12.0–15.0)
Immature Granulocytes: 1 %
Lymphocytes Relative: 7 %
Lymphs Abs: 0.8 K/uL (ref 0.7–4.0)
MCH: 27.3 pg (ref 26.0–34.0)
MCHC: 30.5 g/dL (ref 30.0–36.0)
MCV: 89.5 fL (ref 80.0–100.0)
Monocytes Absolute: 1.3 K/uL — ABNORMAL HIGH (ref 0.1–1.0)
Monocytes Relative: 11 %
Neutro Abs: 9.3 K/uL — ABNORMAL HIGH (ref 1.7–7.7)
Neutrophils Relative %: 76 %
Platelets: 207 K/uL (ref 150–400)
RBC: 2.75 MIL/uL — ABNORMAL LOW (ref 3.87–5.11)
RDW: 21.5 % — ABNORMAL HIGH (ref 11.5–15.5)
WBC: 11.9 K/uL — ABNORMAL HIGH (ref 4.0–10.5)
nRBC: 0 % (ref 0.0–0.2)

## 2023-07-28 LAB — GLUCOSE, CAPILLARY
Glucose-Capillary: 68 mg/dL — ABNORMAL LOW (ref 70–99)
Glucose-Capillary: 62 mg/dL — ABNORMAL LOW (ref 70–99)
Glucose-Capillary: 113 mg/dL — ABNORMAL HIGH (ref 70–99)
Glucose-Capillary: 89 mg/dL (ref 70–99)

## 2023-07-28 LAB — BASIC METABOLIC PANEL WITH GFR
Anion gap: 9 (ref 5–15)
Anion gap: 11 (ref 5–15)
BUN: 33 mg/dL — ABNORMAL HIGH (ref 6–20)
BUN: 28 mg/dL — ABNORMAL HIGH (ref 6–20)
CO2: 28 mmol/L (ref 22–32)
CO2: 28 mmol/L (ref 22–32)
Calcium: 8.7 mg/dL — ABNORMAL LOW (ref 8.9–10.3)
Calcium: 8.3 mg/dL — ABNORMAL LOW (ref 8.9–10.3)
Chloride: 98 mmol/L (ref 98–111)
Chloride: 94 mmol/L — ABNORMAL LOW (ref 98–111)
Creatinine, Ser: 1.02 mg/dL — ABNORMAL HIGH (ref 0.44–1.00)
Creatinine, Ser: 1.09 mg/dL — ABNORMAL HIGH (ref 0.44–1.00)
GFR, Estimated: >60 mL/min (ref >60)
GFR, Estimated: >60 mL/min (ref >60)
Glucose, Bld: 72 mg/dL (ref 70–99)
Glucose, Bld: 120 mg/dL — ABNORMAL HIGH (ref 70–99)
Potassium: 3.6 mmol/L (ref 3.5–5.1)
Potassium: 3.4 mmol/L — ABNORMAL LOW (ref 3.5–5.1)
Sodium: 135 mmol/L (ref 135–145)
Sodium: 133 mmol/L — ABNORMAL LOW (ref 135–145)

## 2023-07-28 LAB — LACTATE DEHYDROGENASE: LDH: 390 U/L — ABNORMAL HIGH (ref 98–192)

## 2023-07-28 LAB — COOXEMETRY PANEL
Carboxyhemoglobin: 3.1 % — ABNORMAL HIGH (ref 0.5–1.5)
Methemoglobin: 1.1 % (ref 0.0–1.5)
O2 Saturation: 63 %
Total hemoglobin: 8.5 g/dL — ABNORMAL LOW (ref 12.0–16.0)

## 2023-07-28 LAB — SURGICAL PATHOLOGY

## 2023-07-28 LAB — ECHO INTRAOPERATIVE TEE
Height: 70 in
Weight: 3897.73 [oz_av]

## 2023-07-28 LAB — PHOSPHORUS: Phosphorus: 3.3 mg/dL (ref 2.5–4.6)

## 2023-07-28 LAB — PROTIME-INR
INR: 1.8 — ABNORMAL HIGH (ref 0.8–1.2)
Prothrombin Time: 21.7 s — ABNORMAL HIGH (ref 11.4–15.2)

## 2023-07-28 LAB — MAGNESIUM: Magnesium: 1.8 mg/dL (ref 1.7–2.4)

## 2023-07-28 MED ORDER — WARFARIN SODIUM 5 MG PO TABS
5.0000 mg | ORAL_TABLET | Freq: Once | ORAL | Status: AC
  Administered 2023-07-28: 5 mg via ORAL
  Filled 2023-07-28: qty 1

## 2023-07-28 MED ORDER — MAGNESIUM SULFATE 2 GM/50ML IV SOLN
2.0000 g | Freq: Once | INTRAVENOUS | Status: AC
  Administered 2023-07-28: 2 g via INTRAVENOUS
  Filled 2023-07-28: qty 50

## 2023-07-28 MED ORDER — IRON SUCROSE 300 MG IVPB - SIMPLE MED
300.0000 mg | Freq: Once | Status: AC
  Administered 2023-07-28: 300 mg via INTRAVENOUS
  Filled 2023-07-28: qty 300

## 2023-07-28 MED ORDER — FUROSEMIDE 10 MG/ML IJ SOLN
80.0000 mg | Freq: Two times a day (BID) | INTRAMUSCULAR | Status: AC
  Administered 2023-07-28 (×2): 80 mg via INTRAVENOUS
  Filled 2023-07-28 (×2): qty 8

## 2023-07-28 MED ORDER — FUROSEMIDE 10 MG/ML IJ SOLN: 80.0000 mg | Freq: Three times a day (TID) | INTRAMUSCULAR | Status: DC

## 2023-07-28 MED ORDER — SPIRONOLACTONE 12.5 MG HALF TABLET
12.5000 mg | ORAL_TABLET | Freq: Every day | ORAL | Status: DC
  Administered 2023-07-28: 12.5 mg via ORAL
  Filled 2023-07-28: qty 1

## 2023-07-28 MED ORDER — POTASSIUM CHLORIDE CRYS ER 10 MEQ PO TBCR
40.0000 meq | EXTENDED_RELEASE_TABLET | ORAL | Status: AC
  Administered 2023-07-28 – 2023-07-29 (×2): 40 meq via ORAL
  Filled 2023-07-28: qty 4
  Filled 2023-07-28: qty 2

## 2023-07-28 NOTE — Progress Notes (Signed)
 Occupational Therapy Treatment Patient Details Name: [name] [name] MRN: [id number] DOB: [birth year] Today's Date: 07/28/2023   History of present illness 48 yo presented 07/09/23 for transcatheter mitral valve repair with Mitraclip. Post-op severe residual MR and cardiogenic shock. 6/19 impella inserted; extubated 6/20;. S/p HM III LVAD and bioprosthetic MVR 7/3.  PMH-chronic HF (on home milrinone ); CAD, DVT, fibroid tumor, hidradenitis, cardiomyopathy, NSVT, PE, obesity   OT comments  Pt in recliner and agreeable to therapy.  Worked on switch to battery, mod assist initially but progressed to min assist. She does have difficulty recalling what to include in her go bag.  She needs mod assist for donning battery harness, and min assist for LB dressing.  Reviewed sternal precautions in relation to Adls. Contact guard +2 for transfers and mobility.  VSS on 4L O2.   Progressing well.       If plan is discharge home, recommend the following:  A little help with walking and/or transfers;A little help with bathing/dressing/bathroom;Assistance with cooking/housework;Assist for transportation   Equipment Recommendations  BSC/3in1    Recommendations for Other Services      Precautions / Restrictions Precautions Precautions: Sternal;Fall;Other (comment) Recall of Precautions/Restrictions: Intact Precaution/Restrictions Comments: LVAD Restrictions Weight Bearing Restrictions Per Provider Order: Yes RUE Weight Bearing Per Provider Order: Non weight bearing LUE Weight Bearing Per Provider Order: Non weight bearing Other Position/Activity Restrictions: Sternal precautions       Mobility Bed Mobility               General bed mobility comments: up in recliner    Transfers Overall transfer level: Needs assistance Equipment used: None Transfers: Sit to/from Stand Sit to Stand: Contact guard assist, +2 safety/equipment           General transfer comment: CGA +2 for safety and  equipment management. Holding heart pillow for support. Cues for technique and to maintain precautions with good recall. Control descent into chair well but rushes a bit due to fatigue after walking.     Balance Overall balance assessment: Needs assistance Sitting-balance support: Feet supported, No upper extremity supported Sitting balance-Leahy Scale: Good     Standing balance support: During functional activity, No upper extremity supported Standing balance-Leahy Scale: Fair Standing balance comment: no UB support                           ADL either performed or assessed with clinical judgement   ADL Overall ADL's : Needs assistance/impaired     Grooming: Set up;Sitting           Upper Body Dressing : Moderate assistance;Sitting   Lower Body Dressing: Minimal assistance;Sit to/from stand   Toilet Transfer: Contact guard assist;Ambulation           Functional mobility during ADLs: Contact guard assist;Cueing for safety      Extremity/Trunk Assessment              Vision       Perception     Praxis     Communication Communication Communication: Impaired Factors Affecting Communication: Reduced clarity of speech (low volume)   Cognition Arousal: Alert Behavior During Therapy: WFL for tasks assessed/performed Cognition: Cognition impaired       Memory impairment (select all impairments): Working memory     OT - Cognition Comments: difficulty recalling what to keep in go bag                 Following commands:  Intact        Cueing   Cueing Techniques: Verbal cues  Exercises      Shoulder Instructions       General Comments LVAD education provided, pt managing switch from batteries to wall with mod assist initally and min assist upon return to room.  She has some difficulty recalling what to include in go bag    Pertinent Vitals/ Pain       Pain Assessment Pain Assessment: Faces Faces Pain Scale: Hurts little  more Pain Location: sternum Pain Descriptors / Indicators: Guarding, Sore Pain Intervention(s): Limited activity within patient's tolerance, Monitored during session, Repositioned  Home Living                                          Prior Functioning/Environment              Frequency  Min 2X/week        Progress Toward Goals  OT Goals(current goals can now be found in the care plan section)  Progress towards OT goals: Progressing toward goals  Acute Rehab OT Goals Patient Stated Goal: home OT Goal Formulation: With patient Time For Goal Achievement: 08/09/23 Potential to Achieve Goals: Good  Plan      Co-evaluation      Reason for Co-Treatment: Complexity of the patient's impairments (multi-system involvement);For patient/therapist safety;To address functional/ADL transfers PT goals addressed during session: Mobility/safety with mobility;Balance;Strengthening/ROM OT goals addressed during session: ADL's and self-care      AM-PAC OT 6 Clicks Daily Activity     Outcome Measure   Help from another person eating meals?: None Help from another person taking care of personal grooming?: A Little Help from another person toileting, which includes using toliet, bedpan, or urinal?: A Lot Help from another person bathing (including washing, rinsing, drying)?: A Lot Help from another person to put on and taking off regular upper body clothing?: A Lot Help from another person to put on and taking off regular lower body clothing?: A Little 6 Click Score: 16    End of Session Equipment Utilized During Treatment: Oxygen (4L)  OT Visit Diagnosis: Muscle weakness (generalized) (M62.81);Unsteadiness on feet (R26.81);Pain   Activity Tolerance Patient tolerated treatment well   Patient Left in chair;with call bell/phone within reach   Nurse Communication Mobility status        Time: 8971-8891 OT Time Calculation (min): 40 min  Charges: OT  General Charges $OT Visit: 1 Visit OT Treatments $Self Care/Home Management : 23-37 mins  [name] [name], OT Acute Rehabilitation Services Office [phone] Secure Chat Preferred    [name] [name] [place] 07/28/2023, 2:07 PM

## 2023-07-28 NOTE — Progress Notes (Signed)
 Hypoglycemic Event  CBG: 68  Treatment: 4 oz juice/soda  Symptoms: None  Follow-up CBG: [id number]:[id number] CBG Result:62  Possible Reasons for Event: Inadequate meal intake  Comments/MD notified:   0633- CBG 62-  Pt drank 1 ensure plus protein, Will recheck cbg after breakfast

## 2023-07-28 NOTE — Progress Notes (Signed)
 Patient ID: [name] [name], female   DOB: 10-22-1975, 48 y.o.   MRN: [id number] HeartMate 3 Rounding Note  Subjective:    Stable day yesterday. Feels well. Sitting up in chair eating breakfast. Wants to walk. Had BM yesterday.  On milrinone  0.25  Co-ox 63%  CVP 11 LVAD INTERROGATION:  HeartMate IIl LVAD:  Flow 4.3 liters/min, speed 5700, power 4.3, PI 4.2.    Objective:    Vital Signs:   Temp:  [97.7 F (36.5 C)-98.6 F (37 C)] 98 F (36.7 C) (07/07 0611) Pulse Rate:  [71-105] 82 (07/07 0400) Resp:  [14-37] 17 (07/07 0400) BP: (72-100)/(59-80) 79/62 (07/07 0339) SpO2:  [58 %-100 %] 100 % (07/07 0400) Arterial Line BP: (78-116)/(63-91) 91/79 (07/06 1345) Weight:  [887 kg] 112 kg (07/07 0500) Last BM Date : 07/19/23 Mean arterial Pressure 96  Intake/Output:   Intake/Output Summary (Last 24 hours) at 07/28/2023 0654 Last data filed at 07/28/2023 9361 Gross per 24 hour  Intake 688.95 ml  Output 3500 ml  Net -2811.05 ml     Physical Exam: General:  Well appearing. No resp difficulty HEENT: normal Neck: normal Cor: distant heart sounds with LVAD hum present. Lungs: few rales Abdomen: soft, nontender, nondistended. Good bowel sounds. Extremities: mild edema Neuro: alert & orientedx3, cranial nerves grossly intact. moves all 4 extremities w/o difficulty. Affect pleasant  Telemetry: sinus 80's  Labs: Basic Metabolic Panel: Recent Labs  Lab 07/25/23 0500 07/25/23 0510 07/25/23 1330 07/25/23 1634 07/26/23 0508 07/26/23 0521 07/27/23 0313 07/27/23 0457 07/27/23 1530 07/27/23 2111 07/28/23 0341  NA 133*   < > 134*   < > 131*   < > 133* 134* 129* 136 135  K 3.7   < > 3.9   < > 4.2   < > 3.4* 3.5 3.7 3.7 3.6  CL 99  --  98   < > 96*  --  96*  --  95* 98 98  CO2 26  --  24   < > 23  --  26  --  23 29 28   GLUCOSE 122*  --  149*   < > 118*  --  90  --  124* 99 72  BUN 24*  --  24*   < > 27*  --  33*  --  34* 34* 33*  CREATININE 1.15*  --  1.09*   < > 1.42*  --  1.41*   --  1.29* 1.18* 1.02*  CALCIUM  8.8*  --  8.8*   < > 9.1  --  9.0  --  9.0 8.9 8.7*  MG 2.2  --  2.0  --  1.9  --  2.1  --   --   --  1.8  PHOS 4.2  --   --   --  5.4*  --  4.8*  --   --   --  3.3   < > = values in this interval not displayed.    Liver Function Tests: Recent Labs  Lab 07/23/23 0432 07/25/23 0500 07/26/23 0508 07/27/23 0313  AST 33 91* 81* 44*  ALT 14 22 16 11   ALKPHOS 74 50 56 59  BILITOT 1.2 2.2* 2.5* 1.5*  PROT 7.6 5.8* 6.2* 6.3*  ALBUMIN  2.7* 2.6* 2.7* 2.5*   No results for input(s): LIPASE, AMYLASE in the last 168 hours. No results for input(s): AMMONIA in the last 168 hours.  CBC: Recent Labs  Lab 07/25/23 0500 07/25/23 0510 07/25/23 1330 07/25/23 1634 07/25/23  1707 07/26/23 0508 07/26/23 0521 07/26/23 0533 07/27/23 0313 07/27/23 0457 07/28/23 0341  WBC 21.0*  --  18.1* 18.2*  --  18.2*  --   --  14.9*  --  11.9*  NEUTROABS 18.9*  --   --   --   --  15.1*  --   --  12.2*  --  9.3*  HGB 7.5*   < > 7.7* 7.9*   < > 8.5* 9.9* 9.2* 8.1* 8.5* 7.5*  HCT 22.7*   < > 23.6* 24.5*   < > 26.7* 29.0* 27.0* 25.9* 25.0* 24.6*  MCV 87.0  --  86.1 86.0  --  86.4  --   --  86.6  --  89.5  PLT 105*  --  111* 128*  --  160  --   --  182  --  207   < > = values in this interval not displayed.    INR: Recent Labs  Lab 07/24/23 1545 07/25/23 0500 07/26/23 0508 07/27/23 0313 07/28/23 0341  INR 1.6*  1.6* 1.4* 1.4* 1.5* 1.8*    Other results: EKG:   Imaging: DG CHEST PORT 1 VIEW Result Date: 07/27/2023 CLINICAL DATA:  History of severe mitral regurgitation. LVAD in place. EXAM: PORTABLE CHEST 1 VIEW COMPARISON:  07/26/2023 FINDINGS: There is a left IJ Swan-Ganz catheter with tip in the right pulmonary artery. Status post median sternotomy. Bilateral chest tubes are similar in position to the previous exam without signs of pneumothorax. LVAD device is noted over the left lower chest and upper abdomen. Cardiac enlargement. Persistent retrocardiac  opacification within the left mid and lower lung. Atelectasis within the right base is unchanged. Pulmonary vascular congestion is similar to previous exam. IMPRESSION: 1. No significant change in the appearance of the chest compared with the previous exam. 2. Persistent retrocardiac opacification and right base atelectasis. 3. Stable pulmonary vascular congestion. 4. Bilateral chest tubes in place without visible pneumothorax. Electronically Signed   By: Waddell Calk M.D.   On: 07/27/2023 07:33     Medications:     Scheduled Medications:  sodium chloride    Intravenous Once   acetaminophen   1,000 mg Oral Q6H   Or   acetaminophen  (TYLENOL ) oral liquid 160 mg/5 mL  1,000 mg Per Tube Q6H   amiodarone   200 mg Oral BID   aspirin  EC  81 mg Oral Daily   bisacodyl   10 mg Oral Daily   Or   bisacodyl   10 mg Rectal Daily   Chlorhexidine  Gluconate Cloth  6 each Topical Daily   docusate sodium   200 mg Oral Daily   enoxaparin  (LOVENOX ) injection  40 mg Subcutaneous QHS   Fe Fum-Vit C-Vit B12-FA  1 capsule Oral QPC breakfast   feeding supplement  237 mL Oral TID BM   gabapentin   200 mg Oral QHS   insulin  aspart  0-24 Units Subcutaneous TID WC   metoCLOPramide  (REGLAN ) injection  10 mg Intravenous Q6H   pantoprazole   40 mg Oral Daily   polyethylene glycol  17 g Oral Daily   potassium chloride   20 mEq Oral Q4H   warfarin  5 mg Oral ONCE-1600   Warfarin - Physician Dosing Inpatient   Does not apply q1600    Infusions:  milrinone  0.25 mcg/kg/min (07/28/23 0600)    PRN Medications: ondansetron  (ZOFRAN ) IV, mouth rinse, oxyCODONE , phenol, traMADol    Assessment:   POD 4 HeartMate 3 LVAD as destination therapy, removal of Impella and mitral valve replacement with 33 mm Medtronic  Mosaic porcine valve. Noted to have acute/subacute pericarditis with inflammatory exudate over heart and pericardium.   Hx of stage D non-ischemic cardiomyopathy and chronic systolic heart failure with biventricular  dysfunction and severe MR on home milrinone . Failed mTEER with torrential MR and severe pulmonary hypertension, acute hypoxic respiratory failure, cardiogenic shock requiring IABP and then left subclavian Impella 5.5 for stabilization.    Hx of DVT/PE with IVC filter in past, thrombophilia workup negative at [facility]. Filter could not be removed due to thrombus burden on filter.   Hx of smoking and COPD by PFT's preop with FEV 1 of 1.09 and FVC of 1.43 but probably affected by heart failure and severe pulm htn from MR. She was walking around the ICU with Impella so I suspect her pulmonary function will not be an issue.   Morbid obesity with BMI 36. She was moving around well preop with severe MR/severe pulmonary HTN and Impella.   Malnutrition with albumin  2.6 preop although she was eating well recently.   Expected postop blood loss anemia: she was getting intermittent iron  infusions for chronic anemia at [facility].  Plan/Discussion:    She is hemodynamically stable on low dose milrinone . Plans per AHF team.   -2900 cc yesterday but wt unchanged if accurate. 4 lbs over preop wt which was still a wt weight. Continue diuresis and K+ replacement.  INR coming up nicely. Continue 5 mg Coumadin  today. DC lovenox . Continue ASA 81 mg until INR 2.  DC Ensure.  Consider iron  infusion.  Need VAD coordinator to check battery connection issue so she can get walking.  IS, mobilize.  I reviewed the LVAD parameters from today, and compared the results to the patient's prior recorded data.  No programming changes were made.  The LVAD is functioning within specified parameters.  The patient performs LVAD self-test daily.  LVAD interrogation was negative for any significant power changes, alarms or PI events/speed drops.  LVAD equipment check completed and is in good working order.  Back-up equipment present.   LVAD education done on emergency procedures and precautions and reviewed exit site care.  Length of  Stay: [address]  Dorise POUR [facility] 07/28/2023, 6:54 AM

## 2023-07-28 NOTE — Progress Notes (Signed)
 LVAD Coordinator Rounding Note:  Admitted 07/09/23 post mitral clip with severe residual mitral regurgitation and cardiogenic shock. Impella 5.5 placed 07/10/23 and VAD evaluation initiated.  HM III LVAD implanted on 07/24/23 by Dr.Bartle under destination criteria.  6/17: mTEER with sev posterior leaflet restriction resulting in tear. IABP + PAC. PCWP 40, v up to 60 6/24 : Aggressive diuresed. Started on lasix  drip. Brisk diuresis noted.  6/30: Echo with EF 25-30%, RV moderately dysfunctional/mildly dilated, Mitraclip present with severe MR, mild-moderate TR, dilated IVC.  7/3: s/p HM III LVAD and bioprosthetic MVR 7/4: Extubated  Pt lying in bed this morning. VAD team was paged yesterday about pts black lead not wanting to connect properly. Black lead was disconnected and examined. One of the pins in the lead was bent beyond repair. HeartMate 3 controller exchange performed at the bedside. We discussed that they should never attempt to do this at home without notifying VAD Pager first to triage need and provide support for the patient's safety as they may incur difficulties with the procedure and impair ability to restart pump. I performed elective controller exchange. Patient tolerated controller exchange well and was asymptomatic. Pump restarted as expected with stable VAD parameters on correct prescribed speed of 5800 / 5500 rpms.     This occurs d/t connection being forced and not lining up lead correctly.  Speed increased to 5800 by provider team this morning.  Vital signs: HR: 83 Doppler Pressure:86 Automatic BP: 110/73 (85) O2 Sat: 92 (per ABG) Wt: 250.8>246.9   LVAD interrogation reveals:   Speed: 5800 Flow: 4.8 Power:  4.3 w PI: 4.4 Alarms: none Events:  none Hematocrit: 24 Fixed speed: 5800 Low speed limit: 5500  Drive Line: Existing VAD dressing removed and site care performed using sterile technique. Drive line exit site cleaned with Chlora prep applicators x 2,  allowed to dry, and gauze dressing with silver strip applied flush to exit site. Exit site healing and unincorporated, the velour is fully implanted at exit site. Moderate serousangenous drainage. No redness, tenderness, foul odor or rash noted. Drive line anchor secured. Continue daily dressing changes. Next dressing change 07/29/23 by VAD Coordinator or nurse champion.       Labs:  LDH trend:476>390  INR trend: 1.4>1.8  Anticoagulation Plan: -INR Goal: 2.0-2.5 -ASA Dose: n/a  Blood Products:  Intra-op: 2 FFP, 1 Platelet, 2 Cryo Post-op:  07/24/23: 1 PRBC 07/25/23:1 PRBC  Device:  Arrythmias:  HX: NSVT on p.o. amiodarone   Respiratory:Intubated; currently weaning  Infection:  Renal:  -BUN/CRT: 24/1.15>1.02  Adverse Events on VAD:  VAD Education: [name] pts mother coming to observe dressing change Wednesday 07/30/23.  Plan/Recommendations:  1. Page VAD coordinator with any VAD alarms or equipment issues. 2. Drive line dressing to be changed daily by VAD Coordinator or Oncologist.  Lauraine Ip RN, BSN VAD Coordinator 24/7 Pager [phone]

## 2023-07-28 NOTE — Progress Notes (Signed)
 Physical Therapy Treatment Patient Details Name: [name] [name] MRN: [id number] DOB: Sep 18, 1975 Today's Date: 07/28/2023   History of Present Illness 48 yo presented 07/09/23 for transcatheter mitral valve repair with Mitraclip. Post-op severe residual MR and cardiogenic shock. 6/19 impella inserted; extubated 6/20;. S/p HM III LVAD and bioprosthetic MVR 7/3.  PMH-chronic HF (on home milrinone ); CAD, DVT, fibroid tumor, hidradenitis, cardiomyopathy, NSVT, PE, obesity    PT Comments  Progressing well towards functional goals. Reviewed all precautions and LVAD set-up/equipment use for mobility on battery power. Able to ambulate 150 feet with one standing rest break. Moderate dyspnea. SpO2 92% on 4L. Requires CGA +2 for safety with transfers and gait, no assistive device utilized today. No overt LOB. Patient will continue to benefit from skilled physical therapy services to further improve independence with functional mobility.     If plan is discharge home, recommend the following: Assistance with cooking/housework;Assist for transportation;Help with stairs or ramp for entrance;A little help with walking and/or transfers;A little help with bathing/dressing/bathroom   Can travel by private Psychologist, clinical (4 wheels);BSC/3in1    Recommendations for Other Services       Precautions / Restrictions Precautions Precautions: Sternal;Fall;Other (comment) Recall of Precautions/Restrictions: Intact Precaution/Restrictions Comments: LVAD Restrictions Other Position/Activity Restrictions: Sternal precautions     Mobility  Bed Mobility               General bed mobility comments: Sitting in recliner    Transfers Overall transfer level: Needs assistance Equipment used: None Transfers: Sit to/from Stand Sit to Stand: Contact guard assist, +2 safety/equipment           General transfer comment: CGA +2 for safety and equipment management. Holding  heart pillow for support. Cues for technique and to maintain precautions with good recall. Control descent into chair well but rushes a bit due to fatigue after walking.    Ambulation/Gait Ambulation/Gait assistance: +2 safety/equipment, Contact guard assist (OT and RN) Gait Distance (Feet): 150 Feet Assistive device: None Gait Pattern/deviations: Step-through pattern, Decreased stride length, Wide base of support, Shuffle, Trunk flexed Gait velocity: dec Gait velocity interpretation: <1.8 ft/sec, indicate of risk for recurrent falls   General Gait Details: CGA for safety +2 for equipment management. No AD, holding heart pillow for comfort. Educated on posture, symptom awareness (RPE awareness,) pacing, and energy conservation. Requried one standing rest break to complete distance. No overt buckling. Moderate dyspnea. SpO2 92% on 4L when returning to room but needed to sit to obtain pleth.   Stairs             Wheelchair Mobility     Tilt Bed    Modified Rankin (Stroke Patients Only)       Balance Overall balance assessment: Needs assistance Sitting-balance support: Feet supported, No upper extremity supported Sitting balance-Leahy Scale: Good     Standing balance support: During functional activity, No upper extremity supported Standing balance-Leahy Scale: Fair Standing balance comment: no UB support                            Communication Communication Communication: Impaired Factors Affecting Communication: Reduced clarity of speech (low volume)  Cognition Arousal: Alert Behavior During Therapy: WFL for tasks assessed/performed   PT - Cognitive impairments: No family/caregiver present to determine baseline  Following commands: Intact      Cueing Cueing Techniques: Verbal cues  Exercises Other Exercises Other Exercises: Reviewed prior exercises, states she has been compliant    General Comments General  comments (skin integrity, edema, etc.): LVAD Pump Flow 4.4, Pump speed 5800, Pulse index 4.0, Pump power 4.4. HR in 100s with gait, 80s at rest. All precautions and LVAD use reviewed with pt, teach back method for recall.      Pertinent Vitals/Pain Pain Assessment Pain Assessment: Faces Faces Pain Scale: Hurts little more Pain Location: sternum Pain Descriptors / Indicators: Guarding, Sore    Home Living                          Prior Function            PT Goals (current goals can now be found in the care plan section) Acute Rehab PT Goals Patient Stated Goal: to get better PT Goal Formulation: With patient Time For Goal Achievement: 08/09/23 Potential to Achieve Goals: Good Progress towards PT goals: Progressing toward goals    Frequency    Min 2X/week      PT Plan      Co-evaluation PT/OT/SLP Co-Evaluation/Treatment: Yes Reason for Co-Treatment: Complexity of the patient's impairments (multi-system involvement);For patient/therapist safety;To address functional/ADL transfers PT goals addressed during session: Mobility/safety with mobility;Balance;Strengthening/ROM        AM-PAC PT 6 Clicks Mobility   Outcome Measure  Help needed turning from your back to your side while in a flat bed without using bedrails?: A Little Help needed moving from lying on your back to sitting on the side of a flat bed without using bedrails?: A Little Help needed moving to and from a bed to a chair (including a wheelchair)?: A Little Help needed standing up from a chair using your arms (e.g., wheelchair or bedside chair)?: A Little Help needed to walk in hospital room?: A Little Help needed climbing 3-5 steps with a railing? : A Little 6 Click Score: 18    End of Session Equipment Utilized During Treatment: Other (comment) (LVAD kit) Activity Tolerance: Patient tolerated treatment well Patient left: with call bell/phone within reach;in chair;with nursing/sitter in  room Nurse Communication: Mobility status PT Visit Diagnosis: Unsteadiness on feet (R26.81);Muscle weakness (generalized) (M62.81);Difficulty in walking, not elsewhere classified (R26.2);Pain;Other abnormalities of gait and mobility (R26.89) Pain - part of body:  (sternum)     Time: 8892-8866 PT Time Calculation (min) (ACUTE ONLY): 26 min  Charges:    $Gait Training: 8-22 mins PT General Charges $$ ACUTE PT VISIT: 1 Visit                     Leontine Roads, PT, DPT [facility]  Rehabilitation Services Physical Therapist Office: [phone] Website: [email or web address]    Leontine GORMAN Roads 07/28/2023, 1:15 PM

## 2023-07-28 NOTE — Progress Notes (Addendum)
 Patient ID: [name] [name], female   DOB: Jun 02, 1975, 48 y.o.   MRN: [id number]     Advanced Heart Failure Rounding Note  Cardiologist: Ria Commander, DO  Chief Complaint: Cardiogenic Shock Subjective:    6/17: mTEER with sev posterior leaflet restriction resulting in tear. IABP + PAC. PCWP 40, v up to 60 6/24 : Aggressive diuresed. Started on lasix  drip. Brisk diuresis noted.  6/30: Echo with EF 25-30%, RV moderately dysfunctional/mildly dilated, Mitraclip present with severe MR, mild-moderate TR, dilated IVC.  7/3: s/p HM III LVAD and bioprosthetic MVR 7/4: Extubated  Off Epi. On Milrinone  0.25. Co-ox 63% today. CVP 10   Hgb 7.5 INR 1.8   OOB. Sitting up in chair. Just had BM. C/w incision soreness but no other complaints. Appetite picking up.   LVAD Interrogation HM III: Speed: 5750 Flow: 4.3 PI: 4.2 Power: 4.3.  No PI events    Objective:    Weight Range: 112 kg Body mass index is 35.43 kg/m.   Vital Signs:   Temp:  [97.7 F (36.5 C)-98.6 F (37 C)] 98 F (36.7 C) (07/07 0611) Pulse Rate:  [71-105] 82 (07/07 0400) Resp:  [14-37] 22 (07/07 0600) BP: (72-102)/(59-89) 102/89 (07/07 0600) SpO2:  [58 %-100 %] 100 % (07/07 0400) Arterial Line BP: (78-116)/(63-91) 91/79 (07/06 1345) Weight:  [887 kg] 112 kg (07/07 0500) Last BM Date : 07/19/23  Weight change: Filed Weights   07/26/23 0700 07/27/23 0600 07/28/23 0500  Weight: 107 kg 112 kg 112 kg   Intake/Output:  Intake/Output Summary (Last 24 hours) at 07/28/2023 0721 Last data filed at 07/28/2023 0700 Gross per 24 hour  Intake 637.82 ml  Output 3545 ml  Net -2907.18 ml   Physical Exam   Vitals:   07/28/23 0600 07/28/23 0611  BP: 102/89   Pulse:    Resp: (!) 22   Temp:  98 F (36.7 C)  SpO2:     CVP 10  General: Sitting up in chair. No distress  HEENT: Normal. Neck: JVD 9-10 cm, + LIJ transducer   Cardiac:  RRR, Mechanical heart sounds with LVAD hum present.  Lungs:  clear bilaterally   Abdomen:  soft, non tender, nondistended   LVAD exit site: Well-healed and incorporated. Dressing dry and intact. No erythema or drainage.  Extremities:  warm and dry. No [name]  Neuro:  A&Ox 3. Moves all 4 ext w/o difficulty. Affect pleasant  GU: + foley   Telemetry   NSR 80s (Personally reviewed)    Labs  CBC Recent Labs    07/27/23 0313 07/27/23 0457 07/28/23 0341  WBC 14.9*  --  11.9*  NEUTROABS 12.2*  --  9.3*  HGB 8.1* 8.5* 7.5*  HCT 25.9* 25.0* 24.6*  MCV 86.6  --  89.5  PLT 182  --  207   Basic Metabolic Panel Recent Labs    92/93/74 0313 07/27/23 0457 07/27/23 2111 07/28/23 0341  NA 133*   < > 136 135  K 3.4*   < > 3.7 3.6  CL 96*   < > 98 98  CO2 26   < > 29 28  GLUCOSE 90   < > 99 72  BUN 33*   < > 34* 33*  CREATININE 1.41*   < > 1.18* 1.02*  CALCIUM  9.0   < > 8.9 8.7*  MG 2.1  --   --  1.8  PHOS 4.8*  --   --  3.3   < > = values in  this interval not displayed.   Liver Function Tests Recent Labs    07/26/23 0508 07/27/23 0313  AST 81* 44*  ALT 16 11  ALKPHOS 56 59  BILITOT 2.5* 1.5*  PROT 6.2* 6.3*  ALBUMIN  2.7* 2.5*   BNP (last 3 results) Recent Labs    06/30/23 1218 07/04/23 1057 07/25/23 0500  BNP 600.6* 612.0* 938.1*   Fasting Lipid Panel No results for input(s): CHOL, HDL, LDLCALC, TRIG, CHOLHDL, LDLDIRECT in the last 72 hours.  Medications:   Scheduled Medications:  sodium chloride    Intravenous Once   acetaminophen   1,000 mg Oral Q6H   Or   acetaminophen  (TYLENOL ) oral liquid 160 mg/5 mL  1,000 mg Per Tube Q6H   amiodarone   200 mg Oral BID   aspirin  EC  81 mg Oral Daily   Chlorhexidine  Gluconate Cloth  6 each Topical Daily   docusate sodium   200 mg Oral Daily   Fe Fum-Vit C-Vit B12-FA  1 capsule Oral QPC breakfast   gabapentin   200 mg Oral QHS   pantoprazole   40 mg Oral Daily   polyethylene glycol  17 g Oral Daily   potassium chloride   20 mEq Oral Q4H   warfarin  5 mg Oral ONCE-1600   Warfarin - Physician Dosing  Inpatient   Does not apply q1600   Infusions:  milrinone  0.25 mcg/kg/min (07/28/23 0700)   PRN Medications: ondansetron  (ZOFRAN ) IV, mouth rinse, oxyCODONE , phenol, traMADol   Patient Profile   48 y.o. female with history of chronic systolic HF with biventricular dysfunction on home inotrope, severe MR, hx PE/DVT, obesity, CAD, anemia, tobacco use. Presented for mTEER on 07/09/23. Worsening MR leading to cardiogenic shock. S/p LVAD 07/24/23.   Assessment/Plan  Cardiogenic shock>>>now S/P HM III LVAD 07/24/23: due to torrential MR s/p mTEER complicated by severely restricted posterior leaflet that was damaged during repair attempt, IABP and nipride  needed to stabilize. Transitioned to Impella 5.5. Not a transplant candidate with tobacco use.  Nonobstructive CAD on cath 2023.  - Limited echo 7/1: LV systolic function did look improved from prior in 30-35% range, there is thinning/akinesis of the inferolateral wall, mild-moderate RV dysfunction, severe MR, dilated IVC.   - Reviewing prior echoes, the inferolateral wall has been akinetic long-term.  Cath in 5/23 showed mild nonobstructive CAD. Patient has never had cardiac MRI.  - 7/3 HM 3 + bioprosthetic MV replacement.  - LVAD parameters stable today.  - Off Epi. On Milrinone  0.25. Co-ox 63%. CVP 10  - Continue diuresis, IV Lasix  80 mg bid - add Spiro 12.5 mg daily  - continue milrinone  at 0.25. Wait to wean until fully diuresed     HM 3 LVAD - LDL stable  - Continue warfarin, INR 1.8.  - Continue aspirin  81 mg  for 1 month.     Torrential MR - S/p bioprosthetic MVR.    Acute hypoxic respiratory failure - Now extubated post-op.   - stable   Hx of PE/DVT w/ IVC filter - Evaluated at [facility] where thrombophilia work-up was negative (Lupus inhibitor, anticardiolipin antibodies, and anti beta-2  glycoprotein antibodies were negative; protein C activity 85%, protein S activity 76%, factor V Leiden, and prothrombin gene mutation ) - IVC filter in  place; could not be removed due to significant thrombus burden on filter at [facility]  - Warfarin begun. INR 1.8    CAD - LHC in 5/23 w/ nonobstructive CAD involving RCA  Tobacco use - Smoking 3 cigarettes/day at time of last follow-up.  Anemia  -  long h/o IDA, required frequent IV infusions pre-op  - Hgb 7.5 today - d/w CT surgery, will give IV Fe today   PVCs - Amiodarone  200 mg bid currently.   AKI - resolved, SCr 1.02.   CRITICAL CARE Performed by: Caffie Shed, PA-C    Total critical care time: 15 minutes  Critical care time was exclusive of separately billable procedures and treating other patients.  Critical care was necessary to treat or prevent imminent or life-threatening deterioration.  Critical care was time spent personally by me on the following activities: development of treatment plan with patient and/or surrogate as well as nursing, discussions with consultants, evaluation of patient's response to treatment, examination of patient, obtaining history from patient or surrogate, ordering and performing treatments and interventions, ordering and review of laboratory studies, ordering and review of radiographic studies, pulse oximetry and re-evaluation of patient's condition.   Length of Stay: [address] PA-C  07/28/2023, 7:21 AM  Advanced Heart Failure Team Pager [phone] (M-F; 7a - 5p)  Please contact [facility] for night-coverage after hours (5p -7a ) and weekends on [email or web address]  Agree with above.   Remains on milrinone . Co-ox 63% CVP 10. Rhythm stable. Feels weak. Denies SOB  General:   Sitting up in bed NAD.  HEENT: normal  Neck: supple. JVP 10  Carotids 2+ bilat; no bruits. No lymphadenopathy or thryomegaly appreciated. Cor: LVAD hum. Sternal wound ok  Lungs: Clear. Abdomen: obese soft, nontender, non-distended. No hepatosplenomegaly. No bruits or masses. Good bowel sounds. Driveline site clean. Anchor in place.  Extremities: no  cyanosis, clubbing, rash. Warm 1+ edema  Neuro: alert & oriented x 3. No focal deficits. Moves all 4 without problem   Improving slowly. Will continue to diurese and mobilize today. Continue milrinone  at 0.25 for RV support until fully diuresed.   I increased VAD speed today. Will need Ramp echo soon   INR 1.8. Discussed warfarin dosing with PharmD personally.  Continue to mobilize. Encouraged IS.   CRITICAL CARE Performed by: Cherrie Sieving  Total critical care time: 40 minutes  Critical care time was exclusive of separately billable procedures and treating other patients.  Critical care was necessary to treat or prevent imminent or life-threatening deterioration.  Critical care was time spent personally by me (independent of midlevel providers or residents) on the following activities: development of treatment plan with patient and/or surrogate as well as nursing, discussions with consultants, evaluation of patient's response to treatment, examination of patient, obtaining history from patient or surrogate, ordering and performing treatments and interventions, ordering and review of laboratory studies, ordering and review of radiographic studies, pulse oximetry and re-evaluation of patient's condition.  Sieving Cherrie, MD  2:43 PM

## 2023-07-28 NOTE — Anesthesia Postprocedure Evaluation (Signed)
 Anesthesia Post Note  Patient: [name] [name]  Procedure(s) Performed: INSERTION OF IMPLANTABLE LEFT VENTRICULAR ASSIST DEVICE USING HEARTMATE 3 (Chest) MITRAL VALVE REPLACEMENT USING MEDTRONIC MOSAIC PORCINE BIOPROSTHESIS MITRAL VALVE SIZE (Chest) ECHOCARDIOGRAM, TRANSESOPHAGEAL REMOVAL, CARDIAC ASSIST DEVICE, IMPELLA 5.5     Patient location during evaluation: SICU Anesthesia Type: General Level of consciousness: sedated Pain management: pain level controlled Vital Signs Assessment: post-procedure vital signs reviewed and stable Respiratory status: patient remains intubated per anesthesia plan Cardiovascular status: stable Postop Assessment: no apparent nausea or vomiting Anesthetic complications: no   No notable events documented.  Last Vitals:  Vitals:   07/28/23 1200 07/28/23 1300  BP: 110/73 96/71  Pulse: (!) 224   Resp: (!) 30 (!) 26  Temp:    SpO2:      Last Pain:  Vitals:   07/28/23 1200  TempSrc:   PainSc: 2                  [name] [name] P [name] [name]

## 2023-07-29 ENCOUNTER — Inpatient Hospital Stay: Admitting: Physician Assistant

## 2023-07-29 ENCOUNTER — Encounter (HOSPITAL_COMMUNITY): Admitting: Cardiology

## 2023-07-29 ENCOUNTER — Other Ambulatory Visit: Payer: Self-pay

## 2023-07-29 DIAGNOSIS — Z952 Presence of prosthetic heart valve: Secondary | ICD-10-CM

## 2023-07-29 DIAGNOSIS — R570 Cardiogenic shock: Secondary | ICD-10-CM | POA: Diagnosis not present

## 2023-07-29 DIAGNOSIS — Z9889 Other specified postprocedural states: Secondary | ICD-10-CM

## 2023-07-29 DIAGNOSIS — D649 Anemia, unspecified: Secondary | ICD-10-CM | POA: Diagnosis not present

## 2023-07-29 DIAGNOSIS — Z95828 Presence of other vascular implants and grafts: Secondary | ICD-10-CM

## 2023-07-29 LAB — CBC WITH DIFFERENTIAL/PLATELET
Abs Immature Granulocytes: 0.04 K/uL (ref 0.00–0.07)
Basophils Absolute: 0.1 K/uL (ref 0.0–0.1)
Basophils Relative: 1 %
Eosinophils Absolute: 0.3 K/uL (ref 0.0–0.5)
Eosinophils Relative: 3 %
HCT: 24.5 % — ABNORMAL LOW (ref 36.0–46.0)
Hemoglobin: 7.6 g/dL — ABNORMAL LOW (ref 12.0–15.0)
Immature Granulocytes: 0 %
Lymphocytes Relative: 8 %
Lymphs Abs: 0.7 K/uL (ref 0.7–4.0)
MCH: 27.6 pg (ref 26.0–34.0)
MCHC: 31 g/dL (ref 30.0–36.0)
MCV: 89.1 fL (ref 80.0–100.0)
Monocytes Absolute: 1.1 K/uL — ABNORMAL HIGH (ref 0.1–1.0)
Monocytes Relative: 12 %
Neutro Abs: 7.2 K/uL (ref 1.7–7.7)
Neutrophils Relative %: 76 %
Platelets: 223 K/uL (ref 150–400)
RBC: 2.75 MIL/uL — ABNORMAL LOW (ref 3.87–5.11)
RDW: 20.9 % — ABNORMAL HIGH (ref 11.5–15.5)
WBC: 9.4 K/uL (ref 4.0–10.5)
nRBC: 0.2 % (ref 0.0–0.2)

## 2023-07-29 LAB — COOXEMETRY PANEL
Carboxyhemoglobin: 1.8 % — ABNORMAL HIGH (ref 0.5–1.5)
Methemoglobin: <0.7 % (ref 0.0–1.5)
O2 Saturation: 67.9 %
Total hemoglobin: 9 g/dL — ABNORMAL LOW (ref 12.0–16.0)

## 2023-07-29 LAB — MAGNESIUM: Magnesium: 1.8 mg/dL (ref 1.7–2.4)

## 2023-07-29 LAB — GLUCOSE, CAPILLARY
Glucose-Capillary: 94 mg/dL (ref 70–99)
Glucose-Capillary: 93 mg/dL (ref 70–99)
Glucose-Capillary: 100 mg/dL — ABNORMAL HIGH (ref 70–99)
Glucose-Capillary: 109 mg/dL — ABNORMAL HIGH (ref 70–99)

## 2023-07-29 LAB — BASIC METABOLIC PANEL WITH GFR
Anion gap: 7 (ref 5–15)
BUN: 24 mg/dL — ABNORMAL HIGH (ref 6–20)
CO2: 30 mmol/L (ref 22–32)
Calcium: 8.7 mg/dL — ABNORMAL LOW (ref 8.9–10.3)
Chloride: 96 mmol/L — ABNORMAL LOW (ref 98–111)
Creatinine, Ser: 0.87 mg/dL (ref 0.44–1.00)
GFR, Estimated: >60 mL/min (ref >60)
Glucose, Bld: 93 mg/dL (ref 70–99)
Potassium: 4.1 mmol/L (ref 3.5–5.1)
Sodium: 133 mmol/L — ABNORMAL LOW (ref 135–145)

## 2023-07-29 LAB — LACTATE DEHYDROGENASE: LDH: 338 U/L — ABNORMAL HIGH (ref 98–192)

## 2023-07-29 LAB — PROTIME-INR
INR: 1.7 — ABNORMAL HIGH (ref 0.8–1.2)
Prothrombin Time: 21.3 s — ABNORMAL HIGH (ref 11.4–15.2)

## 2023-07-29 LAB — PHOSPHORUS: Phosphorus: 2.3 mg/dL — ABNORMAL LOW (ref 2.5–4.6)

## 2023-07-29 MED ORDER — MAGNESIUM SULFATE 4 GM/100ML IV SOLN
4.0000 g | Freq: Once | INTRAVENOUS | Status: AC
  Administered 2023-07-29: 4 g via INTRAVENOUS
  Filled 2023-07-29: qty 100

## 2023-07-29 MED ORDER — POTASSIUM CHLORIDE CRYS ER 10 MEQ PO TBCR
40.0000 meq | EXTENDED_RELEASE_TABLET | ORAL | Status: AC
  Administered 2023-07-29 (×2): 40 meq via ORAL
  Filled 2023-07-29 (×2): qty 4

## 2023-07-29 MED ORDER — FUROSEMIDE 10 MG/ML IJ SOLN
80.0000 mg | Freq: Two times a day (BID) | INTRAMUSCULAR | Status: AC
  Administered 2023-07-29 (×2): 80 mg via INTRAVENOUS
  Filled 2023-07-29 (×2): qty 8

## 2023-07-29 MED ORDER — SODIUM CHLORIDE 0.9% FLUSH
10.0000 mL | Freq: Two times a day (BID) | INTRAVENOUS | Status: DC
  Administered 2023-07-29 – 2023-08-03 (×10): 10 mL
  Administered 2023-08-04: 20 mL
  Administered 2023-08-04: 10 mL
  Administered 2023-08-05: 20 mL

## 2023-07-29 MED ORDER — SODIUM CHLORIDE 0.9% FLUSH: 10.0000 mL | INTRAVENOUS | Status: DC | PRN

## 2023-07-29 MED ORDER — METOLAZONE 2.5 MG PO TABS
2.5000 mg | ORAL_TABLET | Freq: Once | ORAL | Status: AC
  Administered 2023-07-29: 2.5 mg via ORAL
  Filled 2023-07-29: qty 1

## 2023-07-29 MED ORDER — WARFARIN SODIUM 5 MG PO TABS
7.5000 mg | ORAL_TABLET | Freq: Once | ORAL | Status: AC
  Administered 2023-07-29: 7.5 mg via ORAL
  Filled 2023-07-29: qty 1

## 2023-07-29 MED ORDER — POTASSIUM CHLORIDE CRYS ER 20 MEQ PO TBCR: 40.0000 meq | EXTENDED_RELEASE_TABLET | ORAL | Status: DC

## 2023-07-29 MED ORDER — SPIRONOLACTONE 25 MG PO TABS
25.0000 mg | ORAL_TABLET | Freq: Every day | ORAL | Status: DC
  Administered 2023-07-29 – 2023-08-05 (×8): 25 mg via ORAL
  Filled 2023-07-29 (×8): qty 1

## 2023-07-29 MED FILL — Thrombin (Recombinant) For Soln 20000 Unit: CUTANEOUS | Qty: 1 | Status: AC

## 2023-07-29 NOTE — Progress Notes (Signed)
   07/29/23 0001  BiPAP/CPAP/SIPAP  BiPAP/CPAP/SIPAP Pt Type Adult  BiPAP/CPAP/SIPAP Resmed  Mask Type Full face mask  Dentures removed? Not applicable  Mask Size Medium  Respiratory Rate 19 breaths/min  EPAP 11 cmH2O (CPAP oressure 11)  FiO2 (%) 36 %  Flow Rate 4 lpm  Patient Home Machine No  Patient Home Mask No  Patient Home Tubing No  Auto Titrate No  CPAP/SIPAP surface wiped down Yes  Device Plugged into RED Power Outlet Yes  BiPAP/CPAP /SiPAP Vitals  Pulse Rate 77  Resp 19  SpO2 100 %  Bilateral Breath Sounds Clear;Diminished

## 2023-07-29 NOTE — Progress Notes (Signed)
 Pt weighed multiple times on 2 different scales  Wt- 112.7kg on one and 113.7kg on the other

## 2023-07-29 NOTE — Progress Notes (Addendum)
 LVAD Coordinator Rounding Note:  Admitted 07/09/23 post mitral clip with severe residual mitral regurgitation and cardiogenic shock. Impella 5.5 placed 07/10/23 and VAD evaluation initiated.  HM III LVAD implanted on 07/24/23 by Dr. Lucas under destination criteria.  6/17: mTEER with sev posterior leaflet restriction resulting in tear. IABP + PAC. PCWP 40, v up to 60 6/24 : Aggressive diuresed. Started on lasix  drip. Brisk diuresis noted.  6/30: Echo with EF 25-30%, RV moderately dysfunctional/mildly dilated, Mitraclip present with severe MR, mild-moderate TR, dilated IVC.  7/3: s/p HM III LVAD and bioprosthetic MVR 7/4: Extubated 7/7: Speed increased to 5800 7/8: Speed increased to 5900  Pt sitting up in bed on my arrival. Has been out of bed and walking in the halls. States she is feeling so much better today. Has good appetite. Confirmed her mom plans to come tomorrow at 11:00 to begin dressing change teaching.   Speed increased to 5900 by provider team this morning. Plan for RAMP echo tomorrow morning at 10:30.   Vital signs: Temp: 97.3 HR: 79 Doppler Pressure: 90 Automatic BP: 94/66 (75) O2 Sat: not documented Wt: 250.8>246.9>248.4 lbs  LVAD interrogation reveals:  Speed: 5900 Flow: 4.5 Power:  4.6 w PI: 4.3  Alarms: none Events:  none Hematocrit: 24  Fixed speed: 5900 Low speed limit: 5600  Drive Line: Existing VAD dressing removed and site care performed using sterile technique. Drive line exit site cleaned with Chlora prep applicators x 2, allowed to dry, and gauze dressing with silver strip applied flush to exit site. Exit site healing and unincorporated, the velour is fully implanted at exit site. Suture intact. Moderate serousangenous drainage. No redness, tenderness, foul odor or rash noted. Drive line anchor secured. Continue daily dressing changes. Next dressing change 07/30/23 by VAD Coordinator or nurse champion.   4 small blisters noted near outer edge of tape  under skin fold. Covered blisters with gauze to prevent sticking to tape.     Labs:  LDH trend: [phone]  INR trend: 1.4>1.8>1.7  Anticoagulation Plan: -INR Goal: 2.0-2.5 -ASA Dose: n/a  Blood Products:  Intra-op: 2 FFP, 1 Platelet, 2 Cryo Post-op:  07/24/23: 1 PRBC 07/25/23:1 PRBC  Device:  Arrythmias:  HX: NSVT on p.o. amiodarone   Respiratory: extubated 07/25/23  Infection:  Renal:  -BUN/CRT: 24/1.15>1.02>24/0.87  Drips:  Milrinone  0.25 mcg/kg/min  Adverse Events on VAD:  VAD Education: [name] pts mother coming to observe dressing change Wednesday 07/30/23. Discussed need to wear cap/mask with each dressing change. Discussed buttons and functions of system controller.  Demonstrated how to see last 6 alarms on controller. Discussed green pump running symbol Pt verbalized how to perform self test Pt performed power source change independently. Verbalized one at a time. Pt demonstrated how to check battery life on controller and on battery Discussed she will go home with 8 batteries, using 4 batteries each week and rotating weekly. Discussed a pair of batteries lasts 16-18 hrs.  Discussed functions/maintenance of Magazine features editor Reviewed how to fill out VAD flow sheet daily (red folder)  Plan/Recommendations:  1. Page VAD coordinator with any VAD alarms or equipment issues. 2. Drive line dressing to be changed daily by VAD Coordinator or Oncologist.  [name] RN VAD Coordinator  Office: [phone]  24/7 Pager: [phone]

## 2023-07-29 NOTE — TOC Progression Note (Addendum)
 Transition of Care [facility]) - Progression Note    Patient Details  Name: [name] [name] MRN: [id number] Date of Birth: September 21, 1975  Transition of Care [facility]) CM/SW Contact  Graves-Bigelow, [name] Deems, RN Phone Number: 07/29/2023, 4:15 PM  Clinical Narrative:  Case Manager covering for HF Case Manager. Patient had questions regarding [place] Medicaid Personal Care Services. [facility] has called the patient regarding PCS. Case Manager attempted to call [facility] today and they are closed. Case Manager will call back tomorrow to see what updated information is needed.  Case Manager will reach out to the LVAD RN as well.   Expected Discharge Plan: IP Rehab Facility Barriers to Discharge: Continued Medical Work up  Expected Discharge Plan and Services   Discharge Planning Services: CM Consult Post Acute Care Choice: IP Rehab Living arrangements for the past 2 months: Single Family Home        HH Arranged: RN [facility] Agency: [facility] Date HH Agency Contacted: 07/11/23 Time HH Agency Contacted: [phone] Representative spoke with at [facility] Agency: Pam RN   Social Determinants of Health (SDOH) Interventions SDOH Screenings   Food Insecurity: No Food Insecurity (05/20/2023)  Housing: Low Risk  (05/20/2023)  Transportation Needs: No Transportation Needs (05/20/2023)  Utilities: Not At Risk (05/20/2023)  Alcohol  Screen: Low Risk  (04/03/2023)  Depression (PHQ2-9): Low Risk  (05/26/2023)  Financial Resource Strain: Low Risk  (04/03/2023)  Physical Activity: Insufficiently Active (04/03/2023)  Social Connections: Socially Isolated (04/03/2023)  Stress: No Stress Concern Present (04/03/2023)  Tobacco Use: High Risk (07/24/2023)    Readmission Risk Interventions    07/03/2022   12:12 PM 02/13/2022   12:41 PM 12/31/2021    1:52 PM  Readmission Risk Prevention Plan  Transportation Screening Complete Complete   HRI or Home Care Consult Complete Complete Complete  Social Work Consult for Recovery Care  Planning/Counseling Complete Complete Complete  Palliative Care Screening Not Applicable Not Applicable Not Applicable  Medication Review [facility]) Complete Complete Complete

## 2023-07-29 NOTE — Progress Notes (Signed)
 Patient ID: [name] [name], female   DOB: [birth year], 48 y.o.   MRN: [id number]  HeartMate 3 Rounding Note  Subjective:    Had a good day yesterday. LVAD speed increased to 5800. Still on milrinone  0.25.  Co-ox 67.9/ -3400 cc yesterday. CVP 12 this am.  Walked 3 laps this am. Had BM yesterday Sitting up eating breakfast this am.  LVAD INTERROGATION:  HeartMate IIl LVAD:  Flow 4.3 liters/min, speed 5800, power 4.4, PI 4.2.    Objective:    Vital Signs:   Temp:  [98.7 F (37.1 C)-98.9 F (37.2 C)] 98.9 F (37.2 C) (07/08 0700) Pulse Rate:  [77-224] 77 (07/08 0001) Resp:  [16-40] 18 (07/08 0600) BP: (85-110)/(55-91) 108/75 (07/08 0500) SpO2:  [61 %-100 %] 100 % (07/08 0001) FiO2 (%):  [36 %] 36 % (07/08 0001) Weight:  [112.7 kg] 112.7 kg (07/08 0600) Last BM Date : 07/28/23 Mean arterial Pressure 80's  Intake/Output:   Intake/Output Summary (Last 24 hours) at 07/29/2023 0703 Last data filed at 07/29/2023 0600 Gross per 24 hour  Intake 963.69 ml  Output 4375 ml  Net -3411.31 ml     Physical Exam: General:  Well appearing. No resp difficulty HEENT: normal Neck:  left internal jugular sleeve Cor: Distant heart sounds with LVAD hum present. Lungs: clear Abdomen: soft, nontender, nondistended. Good bowel sounds. Extremities: moderate edema in legs and feet. TED stockings on Neuro: alert & orientedx3, cranial nerves grossly intact. moves all 4 extremities w/o difficulty. Affect pleasant  Telemetry: sinus 80's  Labs: Basic Metabolic Panel: Recent Labs  Lab 07/25/23 0500 07/25/23 0510 07/25/23 1330 07/25/23 1634 07/26/23 0508 07/26/23 0521 07/27/23 0313 07/27/23 0457 07/27/23 1530 07/27/23 2111 07/28/23 0341 07/28/23 2012 07/29/23 0400  NA 133*   < > 134*   < > 131*   < > 133*   < > 129* 136 135 133* 133*  K 3.7   < > 3.9   < > 4.2   < > 3.4*   < > 3.7 3.7 3.6 3.4* 4.1  CL 99  --  98   < > 96*  --  96*  --  95* 98 98 94* 96*  CO2 26  --  24   < > 23  --  26   --  23 29 28 28 30   GLUCOSE 122*  --  149*   < > 118*  --  90  --  124* 99 72 120* 93  BUN 24*  --  24*   < > 27*  --  33*  --  34* 34* 33* 28* 24*  CREATININE 1.15*  --  1.09*   < > 1.42*  --  1.41*  --  1.29* 1.18* 1.02* 1.09* 0.87  CALCIUM  8.8*  --  8.8*   < > 9.1  --  9.0  --  9.0 8.9 8.7* 8.3* 8.7*  MG 2.2  --  2.0  --  1.9  --  2.1  --   --   --  1.8  --  1.8  PHOS 4.2  --   --   --  5.4*  --  4.8*  --   --   --  3.3  --  2.3*   < > = values in this interval not displayed.    Liver Function Tests: Recent Labs  Lab 07/23/23 0432 07/25/23 0500 07/26/23 0508 07/27/23 0313  AST 33 91* 81* 44*  ALT 14 22 16 11   ALKPHOS  74 50 56 59  BILITOT 1.2 2.2* 2.5* 1.5*  PROT 7.6 5.8* 6.2* 6.3*  ALBUMIN  2.7* 2.6* 2.7* 2.5*   No results for input(s): LIPASE, AMYLASE in the last 168 hours. No results for input(s): AMMONIA in the last 168 hours.  CBC: Recent Labs  Lab 07/25/23 0500 07/25/23 0510 07/25/23 1634 07/25/23 1707 07/26/23 0508 07/26/23 0521 07/26/23 0533 07/27/23 0313 07/27/23 0457 07/28/23 0341 07/29/23 0400  WBC 21.0*   < > 18.2*  --  18.2*  --   --  14.9*  --  11.9* 9.4  NEUTROABS 18.9*  --   --   --  15.1*  --   --  12.2*  --  9.3* 7.2  HGB 7.5*   < > 7.9*   < > 8.5*   < > 9.2* 8.1* 8.5* 7.5* 7.6*  HCT 22.7*   < > 24.5*   < > 26.7*   < > 27.0* 25.9* 25.0* 24.6* 24.5*  MCV 87.0   < > 86.0  --  86.4  --   --  86.6  --  89.5 89.1  PLT 105*   < > 128*  --  160  --   --  182  --  207 223   < > = values in this interval not displayed.    INR: Recent Labs  Lab 07/25/23 0500 07/26/23 0508 07/27/23 0313 07/28/23 0341 07/29/23 0400  INR 1.4* 1.4* 1.5* 1.8* 1.7*    Other results: EKG:   Imaging: DG CHEST PORT 1 VIEW Result Date: 07/28/2023 CLINICAL DATA:  Chest tube removal. EXAM: PORTABLE CHEST 1 VIEW COMPARISON:  07/27/2023 FINDINGS: The cardio pericardial silhouette is enlarged. Bilateral chest tubes have been removed in the interval. No evidence for  residual pneumothorax. Left IJ pulmonary artery catheter is been removed with left IJ sheath still in place. Left ventricular assist device again noted. Vascular congestion with bibasilar collapse/consolidation is similar to prior. Small left pleural effusion suspected. Telemetry leads overlie the chest. IMPRESSION: 1. Interval removal of bilateral chest tubes and left IJ pulmonary artery catheter. No evidence for pneumothorax. 2. No other significant change. Electronically Signed   By: Camellia Candle M.D.   On: 07/28/2023 07:05     Medications:     Scheduled Medications:  acetaminophen   1,000 mg Oral Q6H   Or   acetaminophen  (TYLENOL ) oral liquid 160 mg/5 mL  1,000 mg Per Tube Q6H   amiodarone   200 mg Oral BID   aspirin  EC  81 mg Oral Daily   Chlorhexidine  Gluconate Cloth  6 each Topical Daily   docusate sodium   200 mg Oral Daily   Fe Fum-Vit C-Vit B12-FA  1 capsule Oral QPC breakfast   gabapentin   200 mg Oral QHS   pantoprazole   40 mg Oral Daily   polyethylene glycol  17 g Oral Daily   spironolactone   12.5 mg Oral Daily   warfarin  7.5 mg Oral ONCE-1600   Warfarin - Physician Dosing Inpatient   Does not apply q1600    Infusions:  milrinone  0.25 mcg/kg/min (07/29/23 0600)    PRN Medications: ondansetron  (ZOFRAN ) IV, mouth rinse, oxyCODONE , phenol, traMADol    Assessment:   POD 5 HeartMate 3 LVAD as destination therapy, removal of Impella and mitral valve replacement with 33 mm Medtronic Mosaic porcine valve. Noted to have acute/subacute pericarditis with inflammatory exudate over heart and pericardium.   Hx of stage D non-ischemic cardiomyopathy and chronic systolic heart failure with biventricular dysfunction and severe MR on  home milrinone . Failed mTEER with torrential MR and severe pulmonary hypertension, acute hypoxic respiratory failure, cardiogenic shock requiring IABP and then left subclavian Impella 5.5 for stabilization.    Hx of DVT/PE with IVC filter in past,  thrombophilia workup negative at [facility]. Filter could not be removed due to thrombus burden on filter.   Hx of smoking and COPD by PFT's preop with FEV 1 of 1.09 and FVC of 1.43 but probably affected by heart failure and severe pulm htn from MR. She was walking around the ICU with Impella so I suspect her pulmonary function will not be an issue.   Morbid obesity with BMI 36. She was moving around well preop with severe MR/severe pulmonary HTN and Impella.   Malnutrition with albumin  2.6 preop although she was eating well recently.   Expected postop blood loss anemia: she was getting intermittent iron  infusions for chronic anemia at [facility]. Received IV iron  yesterday.   Plan/Discussion:    Hemodynamics look good on low dose milrinone . CVP 12 and still has some edema in legs. Wt unchanged with diuresis and nurse checked on different scales. Continue diuresis.  INR stalled at 1.7. Will increase Coumadin  to 7.5.  Continue IS, ambulation.  LVAD teaching.   I reviewed the LVAD parameters from today, and compared the results to the patient's prior recorded data.  No programming changes were made.  The LVAD is functioning within specified parameters.  LVAD interrogation was negative for any significant power changes, alarms or PI events/speed drops.    Length of Stay: [address]  Dorise POUR [facility] 07/29/2023, 7:03 AM

## 2023-07-29 NOTE — Progress Notes (Signed)
 Nutrition Follow-up  DOCUMENTATION CODES:   Not applicable  INTERVENTION:   Pt on Carb Modified Diet; no hx of DM and no hyperglycemia, no insulin  ordered (not even sliding scale), possible hypoglycemia this AM. Recommend considering liberalizing diet.  Encourage protein intake with each meal  Continues snacks, small frequent meals if needed  NUTRITION DIAGNOSIS:   Increased nutrient needs related to acute illness as evidenced by estimated needs.  Being addressed, improved appetite, snacks prn  GOAL:   Patient will meet greater than or equal to 90% of their needs  Progressing  MONITOR:   PO intake, Supplement acceptance, Labs, Weight trends  REASON FOR ASSESSMENT:   Consult Enteral/tube feeding initiation and management  ASSESSMENT:   Pt presented for mitral valve clip procedure. Procedure c/b severe residual MR and cardiogenic shock.  PMH significant for nonischemic cardiomyopathy, chronic biventricular HFrEF, obesity hypoventilation syndrome  6/18: s/p mTEER procedure with severe residual MR, severe posterior leaflet restriction result in tear, cardiogenic shock, IABP placement 6/19: OR for impella 5.5 placement 6/20: extubated 6/27: PFTs with severely reduced volumes 6/30: Bedside ECHO with severe MR, RV mod dilated with mild dysfunction 7/03: OR for HM3 LVAD and bioprosthetic MVR Impella 5.5 removed 7/04 Extubated  New LVAD (destination therapy), milrinone  0.125  Pt feeling good overall, progressing well.   Currently on Carb Modified Diet. Limited documentation of meal intake post-op, only 2 meals documented, 75-100% yesterday 7/10. Appetite fairly good, eating well per report. Pt has scheduled snacks (ordered and sent from kitchen in white bag) but unsure if pt is eating them.  Noted Ensure supplements discontinued  Ambulating well, multiple times daily. Noted pt with transfer orders for [facility] Per MD, pt likely ready for discharge on 7/14 from a VAD teaching  standpoint  Labs: INR 2.3 Sodium 134 (L) Potassium 4.0 wdl) BUN/Creatinine wdl Phosphorus 3.3 (wdl) Magnesium  1.8 (Wdl) CBGs 65-111  Meds:  Trigels-F Forte (Fe Fum-Vit C, V B12, FA)  Lasix  80 mg IV x 1 Aldactone  Torsemide  Warfarin Senokot-S   Diet Order:   Diet Order             Diet Carb Modified Fluid consistency: Thin; Room service appropriate? Yes  Diet effective now                   EDUCATION NEEDS:   No education needs have been identified at this time  Skin:  Skin Assessment: Skin Integrity Issues: Skin Integrity Issues:: Incisions Incisions: Driveline-new LVAD on 7/3  Last BM:  7/11 per report  Height:   Ht Readings from Last 1 Encounters:  07/25/23 5' 10 (1.778 m)    Weight:   Wt Readings from Last 1 Encounters:  08/01/23 112.9 kg    Ideal Body Weight:  68.2 kg  BMI:  Body mass index is 35.71 kg/m.  Estimated Nutritional Needs:   Kcal:  1900-2100  Protein:  100-115g  Fluid:  >/=1.9L    Betsey Finger MS, RDN, LDN, CNSC Registered Dietitian 3 Clinical Nutrition RD Inpatient Contact Info in Amion

## 2023-07-29 NOTE — Progress Notes (Addendum)
 Patient ID: KI LUCKMAN, female   DOB: 1975-07-08, 48 y.o.   MRN: [id number]     Advanced Heart Failure Rounding Note  Cardiologist: Ria Commander, DO  Chief Complaint: Cardiogenic Shock Subjective:    6/17: mTEER with sev posterior leaflet restriction resulting in tear. IABP + PAC. PCWP 40, v up to 60 6/24 : Aggressive diuresed. Started on lasix  drip. Brisk diuresis noted.  6/30: Echo with EF 25-30%, RV moderately dysfunctional/mildly dilated, Mitraclip present with severe MR, mild-moderate TR, dilated IVC.  7/3: s/p HM III LVAD and bioprosthetic MVR 7/4: Extubated 7/7: LVAD speed increased to 5800   CO-OX 68% on 0.25 milrinone .   CVP 12. 4.4L UOP yesterday with 80 mg lasix  IV BID.  Hgb 7.6.  INR 1.7  Feeling well. Has already ambulated 3 laps around the unit this morning. No dyspnea.     LVAD Interrogation HM III: Speed: 5800 Flow: 4.4 PI: 4.4 Power: 4.  No PI events or alarms.  Objective:    Weight Range: 112.7 kg Body mass index is 35.65 kg/m.   Vital Signs:   Temp:  [98.7 F (37.1 C)-98.9 F (37.2 C)] 98.9 F (37.2 C) (07/08 0700) Pulse Rate:  [77-224] 77 (07/08 0001) Resp:  [16-40] 25 (07/08 0800) BP: (85-110)/(55-91) 105/79 (07/08 0800) SpO2:  [61 %-100 %] 100 % (07/08 0001) FiO2 (%):  [36 %] 36 % (07/08 0001) Weight:  [112.7 kg] 112.7 kg (07/08 0600) Last BM Date : 07/28/23  Weight change: Filed Weights   07/27/23 0600 07/28/23 0500 07/29/23 0600  Weight: 112 kg 112 kg 112.7 kg   Intake/Output:  Intake/Output Summary (Last 24 hours) at 07/29/2023 0805 Last data filed at 07/29/2023 0600 Gross per 24 hour  Intake 963.69 ml  Output 4375 ml  Net -3411.31 ml   Physical Exam   Vitals:   07/29/23 0700 07/29/23 0800  BP:  105/79  Pulse:    Resp: (!) 21 (!) 25  Temp: 98.9 F (37.2 C)   SpO2:     Physical Exam: GENERAL: no acute distress. Sitting up in bed. NECK: L internal jugular introducer CARDIAC:  Mechanical heart sounds with LVAD hum  present.  LUNGS:  Breathing nonlabored ABDOMEN:  Soft, round, nontender   LVAD exit site:   Dressing dry and intact.   EXTREMITIES:  1+ lower extremity edema, + TED hose NEUROLOGIC:  Alert and oriented x 4.  Affect pleasant.      Telemetry   SR 70s  Labs  CBC Recent Labs    07/28/23 0341 07/29/23 0400  WBC 11.9* 9.4  NEUTROABS 9.3* 7.2  HGB 7.5* 7.6*  HCT 24.6* 24.5*  MCV 89.5 89.1  PLT 207 223   Basic Metabolic Panel Recent Labs    92/92/74 0341 07/28/23 2012 07/29/23 0400  NA 135 133* 133*  K 3.6 3.4* 4.1  CL 98 94* 96*  CO2 28 28 30   GLUCOSE 72 120* 93  BUN 33* 28* 24*  CREATININE 1.02* 1.09* 0.87  CALCIUM  8.7* 8.3* 8.7*  MG 1.8  --  1.8  PHOS 3.3  --  2.3*   Liver Function Tests Recent Labs    07/27/23 0313  AST 44*  ALT 11  ALKPHOS 59  BILITOT 1.5*  PROT 6.3*  ALBUMIN  2.5*   BNP (last 3 results) Recent Labs    06/30/23 1218 07/04/23 1057 07/25/23 0500  BNP 600.6* 612.0* 938.1*   Fasting Lipid Panel No results for input(s): CHOL, HDL, LDLCALC, TRIG, CHOLHDL, LDLDIRECT in  the last 72 hours.  Medications:   Scheduled Medications:  acetaminophen   1,000 mg Oral Q6H   Or   acetaminophen  (TYLENOL ) oral liquid 160 mg/5 mL  1,000 mg Per Tube Q6H   amiodarone   200 mg Oral BID   aspirin  EC  81 mg Oral Daily   Chlorhexidine  Gluconate Cloth  6 each Topical Daily   docusate sodium   200 mg Oral Daily   Fe Fum-Vit C-Vit B12-FA  1 capsule Oral QPC breakfast   gabapentin   200 mg Oral QHS   pantoprazole   40 mg Oral Daily   polyethylene glycol  17 g Oral Daily   spironolactone   12.5 mg Oral Daily   warfarin  7.5 mg Oral ONCE-1600   Warfarin - Physician Dosing Inpatient   Does not apply q1600   Infusions:  milrinone  0.25 mcg/kg/min (07/29/23 0600)   PRN Medications: ondansetron  (ZOFRAN ) IV, mouth rinse, oxyCODONE , phenol, traMADol   Patient Profile   48 y.o. female with history of chronic systolic HF with biventricular dysfunction on  home inotrope, severe MR, hx PE/DVT, obesity, CAD, anemia, tobacco use. Presented for mTEER on 07/09/23. Worsening MR leading to cardiogenic shock. S/p LVAD 07/24/23.   Assessment/Plan  Cardiogenic shock>>>now S/P HM III LVAD 07/24/23: due to torrential MR s/p mTEER complicated by severely restricted posterior leaflet that was damaged during repair attempt, IABP and nipride  needed to stabilize. Transitioned to Impella 5.5. Not a transplant candidate with tobacco use.  Nonobstructive CAD on cath 2023.  - 7/3 HM 3 + bioprosthetic MV replacement.  - LVAD parameters stable today.  - CO-OX 68% on 0.25 milrinone .  - CVP 12. Continue IV lasix  80 BID. Give 2.5 mg metolazone  today.  - Wean milrinone  once diuresed - Increase spiro to 25 mg daily  HM 3 LVAD - LDH stable  - Continue warfarin, INR 1.7. Warfarin per Dr. Lucas. - Continue aspirin  81 mg  for 1 month.     Torrential MR - S/p bioprosthetic MVR.    Acute hypoxic respiratory failure - Now extubated post-op.   - stable   Hx of PE/DVT w/ IVC filter - Evaluated at [facility] where thrombophilia work-up was negative (Lupus inhibitor, anticardiolipin antibodies, and anti beta-2  glycoprotein antibodies were negative; protein C activity 85%, protein S activity 76%, factor V Leiden, and prothrombin gene mutation ) - IVC filter in place; could not be removed due to significant thrombus burden on filter at [facility]  - Warfarin begun. INR 1.7.   CAD - LHC in 5/23 w/ nonobstructive CAD involving RCA  Tobacco use - Smoking 3 cigarettes/day at time of last follow-up.  Anemia  - long h/o IDA, required frequent IV infusions pre-op  - Hgb 7.6 today - Given IV iron  07/07  PVCs - Amiodarone  200 mg bid currently.   AKI - resolved, Scr 0.87  Remove foley.   Remove introducer, place PICC.  Length of Stay: 20  FINCH, LINDSAY N, PA-C  07/29/2023, 8:05 AM  Advanced Heart Failure Team Pager [phone] (M-F; 7a - 5p)  Please contact [facility] for  night-coverage after hours (5p -7a ) and weekends on [email or web address]  Patient seen and examined with the above-signed Advanced Practice Provider and/or Housestaff. I personally reviewed laboratory data, imaging studies and relevant notes. I independently examined the patient and formulated the important aspects of the plan. I have edited the note to reflect any of my changes or salient points. I have personally discussed the plan with the patient and/or family.  Remains on milrinone  0.25.  Co-ox 68% CVP 12 Diuresing well.   Walked 3 laps.   Denies CP or SOB  INR 1.7   General:  Sitting up in bed. NAD.  HEENT: normal  Cor: LVAD hum.  Lungs: Clear. Abdomen: obese soft, nontender, non-distended. No hepatosplenomegaly. No bruits or masses. Good bowel sounds. Driveline site clean. Anchor in place.  Extremities: no cyanosis, clubbing, rash. Warm tr-1+ edema  Neuro: alert & oriented x 3. No focal deficits. Moves all 4 without problem   She continues to progress well. Continue IV diuresis today.  Once full diuresed will begin milrinone  wean. I increased VAD speed to 5900   INR 1.7 Discussed warfarin dosing with PharmD personally.  Continue to mobilize. Ramp echo tomorrow.   Toribio Fuel, MD  5:30 PM

## 2023-07-29 NOTE — Progress Notes (Signed)
 Peripherally Inserted Central Catheter Placement  The IV Nurse has discussed with the patient and/or persons authorized to consent for the patient, the purpose of this procedure and the potential benefits and risks involved with this procedure.  The benefits include less needle sticks, lab draws from the catheter, and the patient may be discharged home with the catheter. Risks include, but not limited to, infection, bleeding, blood clot (thrombus formation), and puncture of an artery; nerve damage and irregular heartbeat and possibility to perform a PICC exchange if needed/ordered by physician.  Alternatives to this procedure were also discussed.  Bard Power PICC patient education guide, fact sheet on infection prevention and patient information card has been provided to patient /or left at bedside.    PICC Placement Documentation  PICC Double Lumen 07/29/23 Right Brachial 39 cm 1 cm (Active)  Indication for Insertion or Continuance of Line Vasoactive infusions 07/29/23 1704  Exposed Catheter (cm) 1 cm 07/29/23 1704  Site Assessment Clean, Dry, Intact 07/29/23 1704  Lumen #1 Status Flushed;Saline locked;Blood return noted 07/29/23 1704  Lumen #2 Status Flushed;Saline locked;Blood return noted 07/29/23 1704  Dressing Type Transparent;Securing device 07/29/23 1704  Dressing Status Antimicrobial disc/dressing in place;Clean, Dry, Intact 07/29/23 1704  Line Care Connections checked and tightened 07/29/23 1704  Line Adjustment (NICU/IV Team Only) No 07/29/23 1704  Dressing Intervention New dressing;Adhesive placed at insertion site (IV team only) 07/29/23 1704  Dressing Change Due 08/05/23 07/29/23 1704       Renaee Notice Albarece 07/29/2023, 5:05 PM

## 2023-07-29 NOTE — Progress Notes (Signed)
 Physical Therapy Treatment Patient Details Name: [name] [name] MRN: [id number] DOB: 01/23/1975 Today's Date: 07/29/2023   History of Present Illness 48 yo presented 07/09/23 for transcatheter mitral valve repair with Mitraclip. Post-op severe residual MR and cardiogenic shock. 6/19 impella inserted; extubated 6/20;. S/p HM III LVAD and bioprosthetic MVR 7/3.  PMH-chronic HF (on home milrinone ); CAD, DVT, fibroid tumor, hidradenitis, cardiomyopathy, NSVT, PE, obesity    PT Comments  Tolerated well, however a bit more fatigued this afternoon than she felt this morning. States she was able to walk 3 laps in AM with nursing staff and RW for support. During PT session, pt completed about half a lap around unit approx 300 feet with 3 standing rest breaks on 4L, moderate dyspnea, poor pleth unable to obtain sat. CGA without AD, mild instability. SOB improves with short breaks, cues for breathing techniques, pacing, and RPE awareness. Further education on LVAD set-up between batteries<>main power source. Patient will continue to benefit from skilled physical therapy services to further improve independence with functional mobility.    If plan is discharge home, recommend the following: Assistance with cooking/housework;Assist for transportation;Help with stairs or ramp for entrance;A little help with walking and/or transfers;A little help with bathing/dressing/bathroom   Can travel by private Psychologist, clinical (4 wheels);BSC/3in1    Recommendations for Other Services       Precautions / Restrictions Precautions Precautions: Sternal;Fall;Other (comment) Recall of Precautions/Restrictions: Intact Precaution/Restrictions Comments: LVAD Required Braces or Orthoses:  (LVAD kit) Restrictions Other Position/Activity Restrictions: Sternal precautions     Mobility  Bed Mobility Overal bed mobility: Needs Assistance Bed Mobility: Supine to Sit, Sit to Supine      Supine to sit: HOB elevated, Contact guard Sit to supine: Contact guard assist, HOB elevated   General bed mobility comments: CGA for safety and to manage lines/leads. Slower but without physical assist.    Transfers Overall transfer level: Needs assistance Equipment used: None Transfers: Sit to/from Stand Sit to Stand: Contact guard assist           General transfer comment: Performed x2 from edge of bed while holding heart pillow for comfort. Good recall of sternal precautions with transfer. Cues to locate drive line. Haraness on holding LVAD batteries. No physical assist required.    Ambulation/Gait Ambulation/Gait assistance: Contact guard assist Gait Distance (Feet): 300 Feet Assistive device: None Gait Pattern/deviations: Step-through pattern, Decreased stride length, Wide base of support, Shuffle, Trunk flexed Gait velocity: dec Gait velocity interpretation: 1.31 - 2.62 ft/sec, indicative of limited community ambulator   General Gait Details: Mild instability at times but able to self correct with cues for awareness, CGA for safety. Supplemental O2 on 4L, poor pleth, moderate dyspnea which improves to mild with standing rest breaks. Required 2 standing breaks to complete distance. Cues for safety, slower pacing, RPE awarness (avg 5/10 with this bout) , and energy conservation techniques. No overt buckling or LOB but instability increased with fatigue.  Opted to ambulate without AD.   Stairs             Wheelchair Mobility     Tilt Bed    Modified Rankin (Stroke Patients Only)       Balance Overall balance assessment: Needs assistance Sitting-balance support: Feet supported, No upper extremity supported Sitting balance-Leahy Scale: Good     Standing balance support: During functional activity, No upper extremity supported Standing balance-Leahy Scale: Fair Standing balance comment: no UB support  Communication  Communication Communication: Impaired Factors Affecting Communication: Reduced clarity of speech (low volume)  Cognition Arousal: Alert Behavior During Therapy: WFL for tasks assessed/performed   PT - Cognitive impairments: No family/caregiver present to determine baseline, Problem solving                         Following commands: Intact      Cueing Cueing Techniques: Verbal cues  Exercises Other Exercises Other Exercises: Reviewed prior exercises, states she has been compliant. Added SLR bil x5 ea    General Comments General comments (skin integrity, edema, etc.): LVAD education; reviewed set-up with batteries, harness, and back to main power source with teach back techniques. Still needs assist with fine motor portion (connecting power lines.)      Pertinent Vitals/Pain Pain Assessment Pain Assessment: Faces Faces Pain Scale: Hurts little more Pain Location: sternum Pain Descriptors / Indicators: Guarding, Sore Pain Intervention(s): Monitored during session, Repositioned, Limited activity within patient's tolerance    Home Living                          Prior Function            PT Goals (current goals can now be found in the care plan section) Acute Rehab PT Goals Patient Stated Goal: to get better PT Goal Formulation: With patient Time For Goal Achievement: 08/09/23 Potential to Achieve Goals: Good Progress towards PT goals: Progressing toward goals    Frequency    Min 2X/week      PT Plan      Co-evaluation              AM-PAC PT 6 Clicks Mobility   Outcome Measure  Help needed turning from your back to your side while in a flat bed without using bedrails?: A Little Help needed moving from lying on your back to sitting on the side of a flat bed without using bedrails?: A Little Help needed moving to and from a bed to a chair (including a wheelchair)?: A Little Help needed standing up from a chair using your arms (e.g.,  wheelchair or bedside chair)?: A Little Help needed to walk in hospital room?: A Little Help needed climbing 3-5 steps with a railing? : A Little 6 Click Score: 18    End of Session Equipment Utilized During Treatment: Other (comment);Oxygen (LVAD kit) Activity Tolerance: Patient tolerated treatment well Patient left: with call bell/phone within reach;with nursing/sitter in room;in bed;with bed alarm set Nurse Communication: Mobility status PT Visit Diagnosis: Unsteadiness on feet (R26.81);Muscle weakness (generalized) (M62.81);Difficulty in walking, not elsewhere classified (R26.2);Pain;Other abnormalities of gait and mobility (R26.89) Pain - part of body:  (sternum)     Time: 8840-8773 PT Time Calculation (min) (ACUTE ONLY): 27 min  Charges:    $Gait Training: 8-22 mins $Self Care/Home Management: 8-22 PT General Charges $$ ACUTE PT VISIT: 1 Visit                     Leontine Roads, PT, DPT [facility]  Rehabilitation Services Physical Therapist Office: [phone] Website: [email or web address]    Leontine GORMAN Roads 07/29/2023, 1:05 PM

## 2023-07-30 ENCOUNTER — Inpatient Hospital Stay (HOSPITAL_COMMUNITY)

## 2023-07-30 DIAGNOSIS — Z95828 Presence of other vascular implants and grafts: Secondary | ICD-10-CM

## 2023-07-30 DIAGNOSIS — Z952 Presence of prosthetic heart valve: Secondary | ICD-10-CM

## 2023-07-30 DIAGNOSIS — R570 Cardiogenic shock: Secondary | ICD-10-CM | POA: Diagnosis not present

## 2023-07-30 DIAGNOSIS — Z9889 Other specified postprocedural states: Secondary | ICD-10-CM

## 2023-07-30 DIAGNOSIS — D649 Anemia, unspecified: Secondary | ICD-10-CM | POA: Diagnosis not present

## 2023-07-30 LAB — ECHOCARDIOGRAM LIMITED
Height: 70 in
S' Lateral: 5.25 cm
Weight: 3964.75 [oz_av]

## 2023-07-30 LAB — BASIC METABOLIC PANEL WITH GFR
Anion gap: 9 (ref 5–15)
BUN: 23 mg/dL — ABNORMAL HIGH (ref 6–20)
CO2: 30 mmol/L (ref 22–32)
Calcium: 8.6 mg/dL — ABNORMAL LOW (ref 8.9–10.3)
Chloride: 91 mmol/L — ABNORMAL LOW (ref 98–111)
Creatinine, Ser: 0.74 mg/dL (ref 0.44–1.00)
GFR, Estimated: >60 mL/min (ref >60)
Glucose, Bld: 99 mg/dL (ref 70–99)
Potassium: 3.8 mmol/L (ref 3.5–5.1)
Sodium: 130 mmol/L — ABNORMAL LOW (ref 135–145)

## 2023-07-30 LAB — CBC WITH DIFFERENTIAL/PLATELET
Abs Immature Granulocytes: 0.03 K/uL (ref 0.00–0.07)
Basophils Absolute: 0 K/uL (ref 0.0–0.1)
Basophils Relative: 1 %
Eosinophils Absolute: 0.3 K/uL (ref 0.0–0.5)
Eosinophils Relative: 3 %
HCT: 25.1 % — ABNORMAL LOW (ref 36.0–46.0)
Hemoglobin: 7.7 g/dL — ABNORMAL LOW (ref 12.0–15.0)
Immature Granulocytes: 0 %
Lymphocytes Relative: 11 %
Lymphs Abs: 0.8 K/uL (ref 0.7–4.0)
MCH: 27.5 pg (ref 26.0–34.0)
MCHC: 30.7 g/dL (ref 30.0–36.0)
MCV: 89.6 fL (ref 80.0–100.0)
Monocytes Absolute: 0.9 K/uL (ref 0.1–1.0)
Monocytes Relative: 12 %
Neutro Abs: 5.6 K/uL (ref 1.7–7.7)
Neutrophils Relative %: 73 %
Platelets: 254 K/uL (ref 150–400)
RBC: 2.8 MIL/uL — ABNORMAL LOW (ref 3.87–5.11)
RDW: 20.6 % — ABNORMAL HIGH (ref 11.5–15.5)
WBC: 7.7 K/uL (ref 4.0–10.5)
nRBC: 0 % (ref 0.0–0.2)

## 2023-07-30 LAB — GLUCOSE, CAPILLARY
Glucose-Capillary: 88 mg/dL (ref 70–99)
Glucose-Capillary: 106 mg/dL — ABNORMAL HIGH (ref 70–99)
Glucose-Capillary: 106 mg/dL — ABNORMAL HIGH (ref 70–99)
Glucose-Capillary: 123 mg/dL — ABNORMAL HIGH (ref 70–99)

## 2023-07-30 LAB — COOXEMETRY PANEL
Carboxyhemoglobin: 1.4 % (ref 0.5–1.5)
Methemoglobin: <0.7 % (ref 0.0–1.5)
O2 Saturation: 59.7 %
Total hemoglobin: 7.9 g/dL — ABNORMAL LOW (ref 12.0–16.0)

## 2023-07-30 LAB — PROTIME-INR
INR: 1.8 — ABNORMAL HIGH (ref 0.8–1.2)
Prothrombin Time: 21.9 s — ABNORMAL HIGH (ref 11.4–15.2)

## 2023-07-30 LAB — MAGNESIUM
Magnesium: >9 mg/dL (ref 1.7–2.4)
Magnesium: 1.8 mg/dL (ref 1.7–2.4)

## 2023-07-30 LAB — LACTATE DEHYDROGENASE: LDH: 301 U/L — ABNORMAL HIGH (ref 98–192)

## 2023-07-30 LAB — PHOSPHORUS: Phosphorus: 2.4 mg/dL — ABNORMAL LOW (ref 2.5–4.6)

## 2023-07-30 MED ORDER — FUROSEMIDE 10 MG/ML IJ SOLN
80.0000 mg | Freq: Once | INTRAMUSCULAR | Status: AC
  Administered 2023-07-30: 80 mg via INTRAVENOUS
  Filled 2023-07-30: qty 8

## 2023-07-30 MED ORDER — WARFARIN SODIUM 5 MG PO TABS
7.5000 mg | ORAL_TABLET | Freq: Once | ORAL | Status: AC
  Administered 2023-07-30: 7.5 mg via ORAL
  Filled 2023-07-30: qty 1

## 2023-07-30 MED ORDER — GABAPENTIN 300 MG PO CAPS
300.0000 mg | ORAL_CAPSULE | Freq: Two times a day (BID) | ORAL | Status: DC
  Administered 2023-07-30 – 2023-08-05 (×13): 300 mg via ORAL
  Filled 2023-07-30 (×13): qty 1

## 2023-07-30 MED ORDER — MAGNESIUM SULFATE 4 GM/100ML IV SOLN
4.0000 g | Freq: Once | INTRAVENOUS | Status: AC
  Administered 2023-07-30: 4 g via INTRAVENOUS
  Filled 2023-07-30: qty 100

## 2023-07-30 MED FILL — Mannitol IV Soln 20%: INTRAVENOUS | Qty: 500 | Status: AC

## 2023-07-30 MED FILL — Calcium Chloride Inj 10%: INTRAVENOUS | Qty: 10 | Status: AC

## 2023-07-30 MED FILL — Sodium Bicarbonate IV Soln 8.4%: INTRAVENOUS | Qty: 50 | Status: AC

## 2023-07-30 MED FILL — Sodium Chloride IV Soln 0.9%: INTRAVENOUS | Qty: 5000 | Status: AC

## 2023-07-30 MED FILL — Heparin Sodium (Porcine) Inj 1000 Unit/ML: INTRAMUSCULAR | Qty: 60 | Status: AC

## 2023-07-30 MED FILL — Heparin Sodium (Porcine) Inj 1000 Unit/ML: INTRAMUSCULAR | Qty: 40 | Status: AC

## 2023-07-30 MED FILL — Electrolyte-R (PH 7.4) Solution: INTRAVENOUS | Qty: 3000 | Status: AC

## 2023-07-30 NOTE — Progress Notes (Signed)
 OT Cancellation Note  Patient Details Name: VIRLEE STROSCHEIN MRN: [id number] DOB: 08/15/1975   Cancelled Treatment:    Reason Eval/Treat Not Completed: Other (comment)- LVAD educator with pt.  Will check back as able.   Etta NOVAK, OT Acute Rehabilitation Services Office [phone] Secure Chat Preferred    Etta GORMAN Hope 07/30/2023, 11:12 AM

## 2023-07-30 NOTE — Progress Notes (Signed)
   07/30/23 0043  BiPAP/CPAP/SIPAP  BiPAP/CPAP/SIPAP Pt Type Adult  BiPAP/CPAP/SIPAP Resmed  Mask Type Full face mask  Dentures removed? Not applicable  Mask Size Medium  Respiratory Rate 20 breaths/min  EPAP 11 cmH2O (CPAP pressure 11)  FiO2 (%) 36 %  Flow Rate 4 lpm  Patient Home Machine No  Patient Home Mask No  Patient Home Tubing No  Auto Titrate No  Device Plugged into RED Power Outlet Yes  BiPAP/CPAP /SiPAP Vitals  Pulse Rate 79  Resp 20  Bilateral Breath Sounds Clear;Diminished  MEWS Score/Color  MEWS Score 0  MEWS Score Color Green

## 2023-07-30 NOTE — Plan of Care (Signed)
  Problem: Clinical Measurements: Goal: Respiratory complications will improve Outcome: Progressing   Problem: Clinical Measurements: Goal: Cardiovascular complication will be avoided Outcome: Progressing   Problem: Education: Goal: Knowledge of General Education information will improve Description: Including pain rating scale, medication(s)/side effects and non-pharmacologic comfort measures Outcome: Progressing

## 2023-07-30 NOTE — Progress Notes (Signed)
 Speed  Flow  PI  Power  LVIDD  AI  Aortic opening MR  TR  Septum  RV  VTI (>18cm)  5900 4.4 4.2 4.6 5.1 none 5/5 none trivial midline Mild-mod  17.6  6000  4.5 4.1 4.7 6.3 none 5/5 none trivial Sl pull left Mild-mod 15.2                                                           Doppler MAP:  Auto cuff BP: 108/87 (96)   Ramp ECHO performed at bedside per Dr Cherrie  At completion of ramp study, patients primary controller programmed:  Fixed speed: 6000 Low speed limit: 5700   [name] RN VAD Coordinator  Office: [phone]  24/7 Pager: [phone]

## 2023-07-30 NOTE — Progress Notes (Signed)
 LVAD Coordinator Rounding Note:  Admitted 07/09/23 post mitral clip with severe residual mitral regurgitation and cardiogenic shock. Impella 5.5 placed 07/10/23 and VAD evaluation initiated.  HM III LVAD implanted on 07/24/23 by Dr. Lucas under destination criteria.  6/17: mTEER with sev posterior leaflet restriction resulting in tear. IABP + PAC. PCWP 40, v up to 60 6/24 : Aggressive diuresed. Started on lasix  drip. Brisk diuresis noted.  6/30: Echo with EF 25-30%, RV moderately dysfunctional/mildly dilated, Mitraclip present with severe MR, mild-moderate TR, dilated IVC.  7/3: s/p HM III LVAD and bioprosthetic MVR 7/4: Extubated 7/7: Speed increased to 5800 7/8: Speed increased to 5900 7/9: Decrease Milrinone  to 0.125 mcg. Speed increased to 6000.   Pt sitting up in bed on my arrival. States she is feeling pretty good today other than soreness. She has walked multiple laps in the hallway. Has transfer orders to [facility].   Ramp echo completed. Speed increased to 6000 per Dr Bensimhon. See separate note for documentation. Milrinone  decreased to 0.125 mcg per Dr Bensimhon.   Continued discharge teaching with pt. Dressing change teaching started with [name] (mom) and pt's cousin. See documentation below.   Home VAD equipment ordered today.   Vital signs: Temp: 97.3 HR: 79 Doppler Pressure: 78 Automatic BP: 108/87 (96) O2 Sat: not documented Wt: 250.8>246.9>248.4>247.8 lbs  LVAD interrogation reveals:  Speed: 6000 Flow: 4.4 Power:  4.6 w PI: 4.2  Alarms: none Events:  none Hematocrit: 25  Fixed speed: 5900 Low speed limit: 5600  Drive Line: Existing VAD dressing removed and site care performed using sterile technique. Drive line exit site cleaned with Chlora prep applicators x 2, allowed to dry, and gauze dressing with silver strip applied flush to exit site. Exit site healing and unincorporated, the velour is fully implanted at exit site. Suture intact. Moderate serousangenous  drainage. No redness, tenderness, foul odor or rash noted. Drive line anchor secured. Continue daily dressing changes. Next dressing change 07/31/23 by VAD Coordinator or nurse champion.     4 small blisters noted near outer edge of tape under skin fold. Covered blisters with gauze to prevent sticking to tape.   Labs:  LDH trend: 476>390>338>301  INR trend: 1.4>1.8>1.7>1.8  Anticoagulation Plan: -INR Goal: 2.0-2.5 -ASA Dose: n/a  Blood Products:  Intra-op: 2 FFP, 1 Platelet, 2 Cryo Post-op:  07/24/23: 1 PRBC 07/25/23:1 PRBC  Device:  Arrythmias:  HX: NSVT on p.o. amiodarone   Respiratory: extubated 07/25/23  Infection:  Renal:  -BUN/CRT: 24/1.15>1.02>24/0.87>23/0.74  Drips:  Milrinone  0.125 mcg/kg/min  Adverse Events on VAD:  VAD Education: Demonstrated and verbalized dressing change for pt's mother and cousin Discussed importance of cap/mask with every dressing change Mom and cousin practiced don/doff of sterile gloves. Sent them home with gloves to practice with. Discussed Coumadin  therapy and reviewed handout in education binder Reviewed nosebleed handout Reviewed when to call the VAD coordinator Discussed need to notify VAD team if PCP or other provider starts/stops medication Reviewed need for antibiotics pre-dental visits  Reviewed need to sleep/nap only on wall power Discussed she may not shower until drive line completely incorporated, and cleared by VAD coordinator to shower.  Discussed care of modular connection  Plan/Recommendations:  1. Page VAD coordinator with any VAD alarms or equipment issues. 2. Drive line dressing to be changed daily by VAD Coordinator or Oncologist.  [name] RN VAD Coordinator  Office: [phone]  24/7 Pager: [phone]

## 2023-07-30 NOTE — TOC Progression Note (Signed)
 Transition of Care [facility]) - Progression Note    Patient Details  Name: [name] [name] MRN: [id number] Date of Birth: [birth year]  [facility]) CM/SW Contact  Graves-Bigelow, [name] Deems, RN Phone Number: 07/30/2023, 3:03 PM  Clinical Narrative: Case Manager did call [facility] and spoke with [facility]. Representative states that the [id number] form has been submitted to [facility]. Patient will need to call [phone] to see if the paperwork has been approved for [facility] personal care aide.   Expected Discharge Plan: IP Rehab Facility Barriers to Discharge: Continued Medical Work up  Expected Discharge Plan and Services   Discharge Planning Services: CM Consult Post Acute Care Choice: IP Rehab Living arrangements for the past 2 months: Single Family Home    HH Arranged: RN [facility] Agency: [facility] Date HH Agency Contacted: 07/11/23 Time HH Agency Contacted: 1534 Representative spoke with at [facility] Agency: Pam RN   Social Determinants of Health (SDOH) Interventions SDOH Screenings   Food Insecurity: No Food Insecurity (05/20/2023)  Housing: Low Risk  (05/20/2023)  Transportation Needs: No Transportation Needs (05/20/2023)  Utilities: Not At Risk (05/20/2023)  Alcohol  Screen: Low Risk  (04/03/2023)  Depression (PHQ2-9): Low Risk  (05/26/2023)  Financial Resource Strain: Low Risk  (04/03/2023)  Physical Activity: Insufficiently Active (04/03/2023)  Social Connections: Socially Isolated (04/03/2023)  Stress: No Stress Concern Present (04/03/2023)  Tobacco Use: High Risk (07/24/2023)    Readmission Risk Interventions    07/03/2022   12:12 PM 02/13/2022   12:41 PM 12/31/2021    1:52 PM  Readmission Risk Prevention Plan  Transportation Screening Complete Complete   HRI or Home Care Consult Complete Complete Complete  Social Work Consult for Recovery Care Planning/Counseling Complete Complete Complete  Palliative Care Screening Not Applicable Not Applicable Not  Applicable  Medication Review Oceanographer) Complete Complete Complete

## 2023-07-30 NOTE — Plan of Care (Signed)

## 2023-07-30 NOTE — Progress Notes (Signed)
 Occupational Therapy Treatment Patient Details Name: JACKSON FETTERS MRN: [id number] DOB: May 20, 1975 Today's Date: 07/30/2023   History of present illness 48 yo presented 07/09/23 for transcatheter mitral valve repair with Mitraclip. Post-op severe residual MR and cardiogenic shock. 6/19 impella inserted; extubated 6/20;. S/p HM III LVAD and bioprosthetic MVR 7/3.  PMH-chronic HF (on home milrinone ); CAD, DVT, fibroid tumor, hidradenitis, cardiomyopathy, NSVT, PE, obesity   OT comments  Pt progressing toward established OT goals. Challenging activity tolerance and self implementation of precautions/VAD management during ADL. Pt bathing at EOB with grossly supervision assist within precautions. Needing continued education and cues for posterior pericare and tactile cueing for technique. Will continue to follow acutely. Recommending HHOT at dc.       If plan is discharge home, recommend the following:  A little help with walking and/or transfers;A little help with bathing/dressing/bathroom;Assistance with cooking/housework;Assist for transportation   Equipment Recommendations  BSC/3in1    Recommendations for Other Services      Precautions / Restrictions Precautions Precautions: Sternal;Fall;Other (comment) Recall of Precautions/Restrictions: Intact Precaution/Restrictions Comments: LVAD Required Braces or Orthoses:  (LVAD kit) Restrictions Other Position/Activity Restrictions: Sternal precautions       Mobility Bed Mobility Overal bed mobility: Needs Assistance             General bed mobility comments: supervision to transition from sitting with BLE crossed in bed to EOB    Transfers Overall transfer level: Needs assistance Equipment used: None Transfers: Sit to/from Stand Sit to Stand: Supervision                 Balance Overall balance assessment: Needs assistance Sitting-balance support: Feet supported, No upper extremity supported Sitting balance-Leahy  Scale: Good     Standing balance support: During functional activity, No upper extremity supported Standing balance-Leahy Scale: Fair                             ADL either performed or assessed with clinical judgement   ADL Overall ADL's : Needs assistance/impaired         Upper Body Bathing: Set up;Sitting   Lower Body Bathing: Supervison/ safety;Sit to/from stand       Lower Body Dressing: Supervision/safety;Sit to/from stand               Functional mobility during ADLs: Contact guard assist;Cueing for safety General ADL Comments: 1-2 cues for optimal techniques    Extremity/Trunk Assessment Upper Extremity Assessment Upper Extremity Assessment: Generalized weakness   Lower Extremity Assessment Lower Extremity Assessment: Defer to PT evaluation        Vision       Perception     Praxis     Communication Communication Communication: No apparent difficulties   Cognition Arousal: Alert Behavior During Therapy: WFL for tasks assessed/performed Cognition: Cognition impaired       Memory impairment (select all impairments): Working memory     OT - Cognition Comments: multimodal cues to carryover techniques for cleaning back side                 Following commands: Intact        Cueing   Cueing Techniques: Verbal cues  Exercises      Shoulder Instructions       General Comments      Pertinent Vitals/ Pain       Pain Assessment Pain Assessment: Faces Faces Pain Scale: Hurts a little bit Pain Location: sternum  Pain Descriptors / Indicators: Guarding, Sore Pain Intervention(s): Monitored during session  Home Living                                          Prior Functioning/Environment              Frequency  Min 2X/week        Progress Toward Goals  OT Goals(current goals can now be found in the care plan section)  Progress towards OT goals: Progressing toward goals  Acute Rehab OT  Goals Patient Stated Goal: get better OT Goal Formulation: With patient Time For Goal Achievement: 08/09/23 Potential to Achieve Goals: Good ADL Goals Pt Will Perform Grooming: with modified independence;standing Pt Will Perform Upper Body Dressing: with modified independence;sitting Pt Will Perform Lower Body Dressing: with modified independence;sit to/from stand Pt Will Transfer to Toilet: with modified independence;ambulating;bedside commode Pt Will Perform Toileting - Clothing Manipulation and hygiene: with modified independence;sit to/from stand Additional ADL Goal #1: Pt will manage LVAD equipment independently. Additional ADL Goal #2: Pt will adhere to sternal precautions during ADLs and mobility.  Plan      Co-evaluation                 AM-PAC OT 6 Clicks Daily Activity     Outcome Measure   Help from another person eating meals?: None Help from another person taking care of personal grooming?: A Little Help from another person toileting, which includes using toliet, bedpan, or urinal?: A Lot Help from another person bathing (including washing, rinsing, drying)?: A Lot Help from another person to put on and taking off regular upper body clothing?: A Lot Help from another person to put on and taking off regular lower body clothing?: A Little 6 Click Score: 16    End of Session    OT Visit Diagnosis: Muscle weakness (generalized) (M62.81);Unsteadiness on feet (R26.81);Pain   Activity Tolerance Patient tolerated treatment well   Patient Left in bed;with bed alarm set   Nurse Communication Mobility status        Time: 8344-8281 OT Time Calculation (min): 23 min  Charges: OT Treatments $Self Care/Home Management : 23-37 mins  Elma JONETTA Lebron FREDERICK, OTR/L [facility] Acute Rehabilitation Office: [phone]   Elma JONETTA Lebron 07/30/2023, 5:40 PM

## 2023-07-30 NOTE — Progress Notes (Signed)
 Patient ID: [name] [name], female   DOB: 1975-05-11, 48 y.o.   MRN: [id number] HeartMate 3 Rounding Note  Subjective:    Had a good day yesterday. Ambulating well. Eating well. Bowels working. No complaints.  Milrinone  0.25. Co-ox 60%.   -3100 cc yesterday. Recorded wt up slightly again. I don't think these are accurate and probably have to do with what equipment is being weighed with her.  LVAD INTERROGATION:  HeartMate IIl LVAD:  Flow 4.5 liters/min, speed 5900, power 5, PI 4.4.    Objective:    Vital Signs:   Temp:  [97.3 F (36.3 C)-98.7 F (37.1 C)] 98 F (36.7 C) (07/09 0700) Pulse Rate:  [79] 79 (07/09 0043) Resp:  [16-32] 16 (07/09 0400) BP: (78-115)/(61-97) 82/66 (07/09 0400) FiO2 (%):  [36 %] 36 % (07/09 0043) Weight:  [886 kg] 113 kg (07/09 0600) Last BM Date : 07/28/23 Mean arterial Pressure 80's  Intake/Output:   Intake/Output Summary (Last 24 hours) at 07/30/2023 0813 Last data filed at 07/30/2023 0700 Gross per 24 hour  Intake 502.17 ml  Output 3540 ml  Net -3037.83 ml     Physical Exam: General:  Well appearing. No resp difficulty HEENT: normal Neck: neck line out Cor: Distant heart sounds with LVAD hum present. Lungs: crackles in bases Abdomen: soft, nontender, nondistended. Good bowel sounds. Extremities: LE edema improving daily. Neuro: alert & orientedx3, cranial nerves grossly intact. moves all 4 extremities w/o difficulty. Affect pleasant  Telemetry: sinus 80's  Labs: Basic Metabolic Panel: Recent Labs  Lab 07/26/23 0508 07/26/23 0521 07/27/23 0313 07/27/23 0457 07/27/23 2111 07/28/23 0341 07/28/23 2012 07/29/23 0400 07/30/23 0454 07/30/23 0456  NA 131*   < > 133*   < > 136 135 133* 133* 130*  --   K 4.2   < > 3.4*   < > 3.7 3.6 3.4* 4.1 3.8  --   CL 96*  --  96*   < > 98 98 94* 96* 91*  --   CO2 23  --  26   < > 29 28 28 30 30   --   GLUCOSE 118*  --  90   < > 99 72 120* 93 99  --   BUN 27*  --  33*   < > 34* 33* 28* 24* 23*  --    CREATININE 1.42*  --  1.41*   < > 1.18* 1.02* 1.09* 0.87 0.74  --   CALCIUM  9.1  --  9.0   < > 8.9 8.7* 8.3* 8.7* 8.6*  --   MG 1.9  --  2.1  --   --  1.8  --  1.8  --  >9.0*  PHOS 5.4*  --  4.8*  --   --  3.3  --  2.3*  --  2.4*   < > = values in this interval not displayed.    Liver Function Tests: Recent Labs  Lab 07/25/23 0500 07/26/23 0508 07/27/23 0313  AST 91* 81* 44*  ALT 22 16 11   ALKPHOS 50 56 59  BILITOT 2.2* 2.5* 1.5*  PROT 5.8* 6.2* 6.3*  ALBUMIN  2.6* 2.7* 2.5*   No results for input(s): LIPASE, AMYLASE in the last 168 hours. No results for input(s): AMMONIA in the last 168 hours.  CBC: Recent Labs  Lab 07/26/23 0508 07/26/23 0521 07/27/23 0313 07/27/23 0457 07/28/23 0341 07/29/23 0400 07/30/23 0456  WBC 18.2*  --  14.9*  --  11.9* 9.4 7.7  NEUTROABS 15.1*  --  12.2*  --  9.3* 7.2 5.6  HGB 8.5*   < > 8.1* 8.5* 7.5* 7.6* 7.7*  HCT 26.7*   < > 25.9* 25.0* 24.6* 24.5* 25.1*  MCV 86.4  --  86.6  --  89.5 89.1 89.6  PLT 160  --  182  --  207 223 254   < > = values in this interval not displayed.    INR: Recent Labs  Lab 07/26/23 0508 07/27/23 0313 07/28/23 0341 07/29/23 0400 07/30/23 0456  INR 1.4* 1.5* 1.8* 1.7* 1.8*    Other results: EKG:   Imaging: US  EKG SITE RITE Result Date: 07/29/2023 If Site Rite image not attached, placement could not be confirmed due to current cardiac rhythm.    Medications:     Scheduled Medications:  amiodarone   200 mg Oral BID   aspirin  EC  81 mg Oral Daily   Chlorhexidine  Gluconate Cloth  6 each Topical Daily   docusate sodium   200 mg Oral Daily   Fe Fum-Vit C-Vit B12-FA  1 capsule Oral QPC breakfast   gabapentin   200 mg Oral QHS   pantoprazole   40 mg Oral Daily   polyethylene glycol  17 g Oral Daily   sodium chloride  flush  10-40 mL Intracatheter Q12H   spironolactone   25 mg Oral Daily   warfarin  7.5 mg Oral ONCE-1600   Warfarin - Physician Dosing Inpatient   Does not apply q1600     Infusions:  milrinone  0.25 mcg/kg/min (07/30/23 0700)    PRN Medications: ondansetron  (ZOFRAN ) IV, mouth rinse, oxyCODONE , phenol, sodium chloride  flush, traMADol    Assessment:    POD 6 HeartMate 3 LVAD as destination therapy, removal of Impella and mitral valve replacement with 33 mm Medtronic Mosaic porcine valve. Noted to have acute/subacute pericarditis with inflammatory exudate over heart and pericardium.   Hx of stage D non-ischemic cardiomyopathy and chronic systolic heart failure with biventricular dysfunction and severe MR on home milrinone . Failed mTEER with torrential MR and severe pulmonary hypertension, acute hypoxic respiratory failure, cardiogenic shock requiring IABP and then left subclavian Impella 5.5 for stabilization.    Hx of DVT/PE with IVC filter in past, thrombophilia workup negative at [facility]. Filter could not be removed due to thrombus burden on filter.   Hx of smoking and COPD by PFT's preop with FEV 1 of 1.09 and FVC of 1.43 but probably affected by heart failure and severe pulm htn from MR. She was walking around the ICU with Impella so I suspect her pulmonary function will not be an issue.   Morbid obesity with BMI 36. She was moving around well preop with severe MR/severe pulmonary HTN and Impella.   Malnutrition with albumin  2.6 preop although she was eating well recently. Eating well postop.   Expected postop blood loss anemia: she was getting intermittent iron  infusions for chronic anemia at [facility]. Received IV iron  two days ago and still on po iron .    Plan/Discussion:    Hemodynamics remain stable on milrinone  0.25.   Diuresing and LE edema improving daily.  Renal function normal.  INR 1.8. Will continue Coumadin  7.5 mg.  Continue IS, ambulation  Drive line dressing change per VAD Coordinator. She needs a sports bra to keep large pendulous breasts up and away from exit site. Discussed with pt. She does not usually wear a bra at  home.   I reviewed the LVAD parameters from today, and compared the results to the patient's prior recorded data.  No programming changes  were made.  The LVAD is functioning within specified parameters.   LVAD interrogation was negative for any significant power changes, alarms or PI events/speed drops.    Length of Stay: [address]  Dorise POUR [facility] 07/30/2023, 8:13 AM

## 2023-07-30 NOTE — Progress Notes (Cosign Needed Addendum)
 Patient ID: ARACELY RICKETT, female   DOB: 1976-01-11, 48 y.o.   MRN: [id number]     Advanced Heart Failure Rounding Note  Cardiologist: Ria Commander, DO  Chief Complaint: Cardiogenic Shock Subjective:    6/17: mTEER with sev posterior leaflet restriction resulting in tear. IABP + PAC. PCWP 40, v up to 60 6/24 : Aggressive diuresed. Started on lasix  drip. Brisk diuresis noted.  6/30: Echo with EF 25-30%, RV moderately dysfunctional/mildly dilated, Mitraclip present with severe MR, mild-moderate TR, dilated IVC.  7/3: s/p HM III LVAD and bioprosthetic MVR 7/4: Extubated 7/7: LVAD speed increased to 5800 7/8: LVAD speed increased to 5900  CO-OX 60% on 0.25 milrinone .   3.6L UOP last 24 hrs with IV lasix  80 BID + 2.5 mg metolazone .  Weight down 1/2 kg. CVP 10-12 sitting up in chair.  INR 1.8.   Ambulated halls this am. Chest sore after returning from walk.    LVAD Interrogation HM III: Speed: 5900 Flow: 4.5 PI: 4.2 Power: 4.6.  No PI events or alarms.  Objective:    Weight Range: 113 kg Body mass index is 35.74 kg/m.   Vital Signs:   Temp:  [97.3 F (36.3 C)-98.7 F (37.1 C)] 98 F (36.7 C) (07/09 0700) Pulse Rate:  [79] 79 (07/09 0043) Resp:  [16-32] 16 (07/09 0400) BP: (78-115)/(61-97) 82/66 (07/09 0400) FiO2 (%):  [36 %] 36 % (07/09 0043) Weight:  [886 kg] 113 kg (07/09 0600) Last BM Date : 07/28/23  Weight change: Filed Weights   07/28/23 0500 07/29/23 0600 07/30/23 0600  Weight: 112 kg 112.7 kg 113 kg   Intake/Output:  Intake/Output Summary (Last 24 hours) at 07/30/2023 0811 Last data filed at 07/30/2023 0700 Gross per 24 hour  Intake 502.17 ml  Output 3540 ml  Net -3037.83 ml   Physical Exam   Vitals:   07/30/23 0400 07/30/23 0700  BP: (!) 82/66   Pulse:    Resp: 16   Temp:  98 F (36.7 C)  SpO2:     Physical Exam: GENERAL: appears uncomfortable NECK: JVP 10-12 CARDIAC:  Mechanical heart sounds with LVAD hum present.  LUNGS:  Clear to  auscultation bilaterally.  ABDOMEN:  Soft, round, nontender LVAD exit site:   Dressing dry and intact.  EXTREMITIES:  Warm and dry, no edema  NEUROLOGIC:  Alert and oriented x 4.  Affect pleasant.      Telemetry   SR 70s-80s  Labs  CBC Recent Labs    07/29/23 0400 07/30/23 0456  WBC 9.4 7.7  NEUTROABS 7.2 5.6  HGB 7.6* 7.7*  HCT 24.5* 25.1*  MCV 89.1 89.6  PLT 223 254   Basic Metabolic Panel Recent Labs    92/91/74 0400 07/30/23 0454 07/30/23 0456  NA 133* 130*  --   K 4.1 3.8  --   CL 96* 91*  --   CO2 30 30  --   GLUCOSE 93 99  --   BUN 24* 23*  --   CREATININE 0.87 0.74  --   CALCIUM  8.7* 8.6*  --   MG 1.8  --  >9.0*  PHOS 2.3*  --  2.4*   Liver Function Tests No results for input(s): AST, ALT, ALKPHOS, BILITOT, PROT, ALBUMIN  in the last 72 hours.  BNP (last 3 results) Recent Labs    06/30/23 1218 07/04/23 1057 07/25/23 0500  BNP 600.6* 612.0* 938.1*   Fasting Lipid Panel No results for input(s): CHOL, HDL, LDLCALC, TRIG, CHOLHDL, LDLDIRECT in the  last 72 hours.  Medications:   Scheduled Medications:  amiodarone   200 mg Oral BID   aspirin  EC  81 mg Oral Daily   Chlorhexidine  Gluconate Cloth  6 each Topical Daily   docusate sodium   200 mg Oral Daily   Fe Fum-Vit C-Vit B12-FA  1 capsule Oral QPC breakfast   gabapentin   200 mg Oral QHS   pantoprazole   40 mg Oral Daily   polyethylene glycol  17 g Oral Daily   sodium chloride  flush  10-40 mL Intracatheter Q12H   spironolactone   25 mg Oral Daily   warfarin  7.5 mg Oral ONCE-1600   Warfarin - Physician Dosing Inpatient   Does not apply q1600   Infusions:  milrinone  0.25 mcg/kg/min (07/30/23 0700)   PRN Medications: ondansetron  (ZOFRAN ) IV, mouth rinse, oxyCODONE , phenol, sodium chloride  flush, traMADol   Patient Profile   48 y.o. female with history of chronic systolic HF with biventricular dysfunction on home inotrope, severe MR, hx PE/DVT, obesity, CAD, anemia, tobacco  use. Presented for mTEER on 07/09/23. Worsening MR leading to cardiogenic shock. S/p LVAD 07/24/23.   Assessment/Plan  Cardiogenic shock>>>now S/P HM III LVAD 07/24/23: due to torrential MR s/p mTEER complicated by severely restricted posterior leaflet that was damaged during repair attempt, IABP and nipride  needed to stabilize. Transitioned to Impella 5.5. Not a transplant candidate with tobacco use.  Nonobstructive CAD on cath 2023.  - 7/3 HM 3 + bioprosthetic MV replacement.  Noted to have acute/subacute pericarditis with inflammatory exudate over heart and pericardium  - LVAD parameters stable today.  - CO-OX 60% on 0.25 milrinone . Wean milrinone  to 0.125 mcg/kg/min. - CVP 12. Not sure there is much more room to diurese. Will give 80 mg lasix  IV X 1 and reassess. - Continue spiro 25 mg daily - Ramp echo this am  HM 3 LVAD - MAP predominately 70s - LDH stable  - Continue warfarin, INR 1.8. Warfarin per Dr. Lucas. - Continue aspirin  81 mg  for 1 month.   - Has started discharge teaching - Increased gabapentin  today to help with pain control   Torrential MR - S/p bioprosthetic MVR.    Acute hypoxic respiratory failure - Now extubated post-op.   - stable   Hx of PE/DVT w/ IVC filter - Evaluated at [facility] where thrombophilia work-up was negative (Lupus inhibitor, anticardiolipin antibodies, and anti beta-2  glycoprotein antibodies were negative; protein C activity 85%, protein S activity 76%, factor V Leiden, and prothrombin gene mutation ) - IVC filter in place; could not be removed due to significant thrombus burden on filter at [facility]  - Warfarin begun. INR 1.8.   CAD - LHC in 5/23 w/ nonobstructive CAD involving RCA  Tobacco use - Smoking 3 cigarettes/day at time of last follow-up.  Anemia  - long h/o IDA, required frequent IV infusions pre-op  - Hgb 7.7 today - Given IV iron  07/07  PVCs - Low burden on telemetry - Amiodarone  200 mg bid currently.   AKI - resolved, Scr has  been stable    Length of Stay: 21  Dequan Kindred N, PA-C  07/30/2023, 8:11 AM  Advanced Heart Failure Team Pager [phone] (M-F; 7a - 5p)  Please contact [facility] for night-coverage after hours (5p -7a ) and weekends on [email or web address]    Patient seen and examined with the above-signed Advanced Practice Provider and/or Housestaff. I personally reviewed laboratory data, imaging studies and relevant notes. I independently examined the patient and formulated the important aspects of the plan.  I have edited the note to reflect any of my changes or salient points. I have personally discussed the plan with the patient and/or family.  Remains on milrinone  0.25. Co-ox 69% . Has diuresed well. Walking around the room.   Ramp echo today with speed adjusted to 6000. (Mild to moderate RV dysfunction)   Denies SOB, orthopnea.   INR 1.8   General:  NAD.  HEENT: normal  Neck: supple. JVP not elevated.  Carotids 2+ bilat; no bruits. No lymphadenopathy or thryomegaly appreciated. Cor: LVAD hum.  Lungs: Clear. Abdomen: obese soft, nontender, non-distended. No hepatosplenomegaly. No bruits or masses. Good bowel sounds. Driveline site clean. Anchor in place.  Extremities: no cyanosis, clubbing, rash. Warm no edema  Neuro: alert & oriented x 3. No focal deficits. Moves all 4 without problem   Much improved. Start weaning milrinone . Change IV lasix  to po torsemide .  Continue to ambulate.   Warfarin dosing per Dr. Lucas  VAD interrogated personally. Parameters stable.  Can go to [facility].   Toribio Fuel, MD  12:30 PM

## 2023-07-31 DIAGNOSIS — D649 Anemia, unspecified: Secondary | ICD-10-CM | POA: Diagnosis not present

## 2023-07-31 DIAGNOSIS — Z952 Presence of prosthetic heart valve: Secondary | ICD-10-CM

## 2023-07-31 DIAGNOSIS — R570 Cardiogenic shock: Secondary | ICD-10-CM | POA: Diagnosis not present

## 2023-07-31 DIAGNOSIS — Z95828 Presence of other vascular implants and grafts: Secondary | ICD-10-CM

## 2023-07-31 DIAGNOSIS — Z9889 Other specified postprocedural states: Secondary | ICD-10-CM

## 2023-07-31 LAB — CBC WITH DIFFERENTIAL/PLATELET
Abs Immature Granulocytes: 0.06 K/uL (ref 0.00–0.07)
Basophils Absolute: 0.1 K/uL (ref 0.0–0.1)
Basophils Relative: 1 %
Eosinophils Absolute: 0.2 K/uL (ref 0.0–0.5)
Eosinophils Relative: 2 %
HCT: 25.7 % — ABNORMAL LOW (ref 36.0–46.0)
Hemoglobin: 8 g/dL — ABNORMAL LOW (ref 12.0–15.0)
Immature Granulocytes: 1 %
Lymphocytes Relative: 8 %
Lymphs Abs: 0.7 K/uL (ref 0.7–4.0)
MCH: 28 pg (ref 26.0–34.0)
MCHC: 31.1 g/dL (ref 30.0–36.0)
MCV: 89.9 fL (ref 80.0–100.0)
Monocytes Absolute: 0.9 K/uL (ref 0.1–1.0)
Monocytes Relative: 10 %
Neutro Abs: 7.2 K/uL (ref 1.7–7.7)
Neutrophils Relative %: 78 %
Platelets: 291 K/uL (ref 150–400)
RBC: 2.86 MIL/uL — ABNORMAL LOW (ref 3.87–5.11)
RDW: 20.8 % — ABNORMAL HIGH (ref 11.5–15.5)
WBC: 9.1 K/uL (ref 4.0–10.5)
nRBC: 0 % (ref 0.0–0.2)

## 2023-07-31 LAB — MAGNESIUM: Magnesium: 1.9 mg/dL (ref 1.7–2.4)

## 2023-07-31 LAB — BASIC METABOLIC PANEL WITH GFR
Anion gap: 10 (ref 5–15)
BUN: 15 mg/dL (ref 6–20)
CO2: 31 mmol/L (ref 22–32)
Calcium: 8.9 mg/dL (ref 8.9–10.3)
Chloride: 90 mmol/L — ABNORMAL LOW (ref 98–111)
Creatinine, Ser: 0.84 mg/dL (ref 0.44–1.00)
GFR, Estimated: >60 mL/min (ref >60)
Glucose, Bld: 102 mg/dL — ABNORMAL HIGH (ref 70–99)
Potassium: 3.7 mmol/L (ref 3.5–5.1)
Sodium: 131 mmol/L — ABNORMAL LOW (ref 135–145)

## 2023-07-31 LAB — GLUCOSE, CAPILLARY
Glucose-Capillary: 89 mg/dL (ref 70–99)
Glucose-Capillary: 85 mg/dL (ref 70–99)
Glucose-Capillary: 85 mg/dL (ref 70–99)
Glucose-Capillary: 111 mg/dL — ABNORMAL HIGH (ref 70–99)

## 2023-07-31 LAB — COOXEMETRY PANEL
Carboxyhemoglobin: 1.6 % — ABNORMAL HIGH (ref 0.5–1.5)
Methemoglobin: <0.7 % (ref 0.0–1.5)
O2 Saturation: 54.1 %
Total hemoglobin: 9.8 g/dL — ABNORMAL LOW (ref 12.0–16.0)

## 2023-07-31 LAB — PROTIME-INR
INR: 1.9 — ABNORMAL HIGH (ref 0.8–1.2)
Prothrombin Time: 22.8 s — ABNORMAL HIGH (ref 11.4–15.2)

## 2023-07-31 LAB — PHOSPHORUS: Phosphorus: 2.7 mg/dL (ref 2.5–4.6)

## 2023-07-31 LAB — BRAIN NATRIURETIC PEPTIDE: B Natriuretic Peptide: 856.6 pg/mL — ABNORMAL HIGH (ref 0.0–100.0)

## 2023-07-31 LAB — LACTATE DEHYDROGENASE: LDH: 288 U/L — ABNORMAL HIGH (ref 98–192)

## 2023-07-31 MED ORDER — POTASSIUM CHLORIDE CRYS ER 20 MEQ PO TBCR
40.0000 meq | EXTENDED_RELEASE_TABLET | Freq: Once | ORAL | Status: AC
  Administered 2023-07-31: 40 meq via ORAL
  Filled 2023-07-31: qty 2

## 2023-07-31 MED ORDER — POTASSIUM CHLORIDE 20 MEQ PO PACK
40.0000 meq | PACK | Freq: Once | ORAL | Status: AC
  Administered 2023-07-31: 40 meq via ORAL
  Filled 2023-07-31: qty 2

## 2023-07-31 MED ORDER — TORSEMIDE 20 MG PO TABS
40.0000 mg | ORAL_TABLET | Freq: Every day | ORAL | Status: DC
  Administered 2023-07-31 – 2023-08-05 (×6): 40 mg via ORAL
  Filled 2023-07-31 (×6): qty 2

## 2023-07-31 MED ORDER — WARFARIN SODIUM 5 MG PO TABS
7.5000 mg | ORAL_TABLET | Freq: Once | ORAL | Status: AC
  Administered 2023-07-31: 7.5 mg via ORAL
  Filled 2023-07-31: qty 1

## 2023-07-31 MED ORDER — MAGNESIUM SULFATE 2 GM/50ML IV SOLN
2.0000 g | Freq: Once | INTRAVENOUS | Status: AC
  Administered 2023-07-31: 2 g via INTRAVENOUS
  Filled 2023-07-31: qty 50

## 2023-07-31 MED FILL — Potassium Chloride Inj 2 mEq/ML: INTRAVENOUS | Qty: 40 | Status: AC

## 2023-07-31 MED FILL — Magnesium Sulfate Inj 50%: INTRAMUSCULAR | Qty: 10 | Status: AC

## 2023-07-31 MED FILL — Heparin Sodium (Porcine) Inj 1000 Unit/ML: Qty: 1000 | Status: AC

## 2023-07-31 NOTE — Progress Notes (Signed)
 LVAD Coordinator Rounding Note:  Admitted 07/09/23 post mitral clip with severe residual mitral regurgitation and cardiogenic shock. Impella 5.5 placed 07/10/23 and VAD evaluation initiated.  HM III LVAD implanted on 07/24/23 by Dr. Lucas under destination criteria.  6/17: mTEER with sev posterior leaflet restriction resulting in tear. IABP + PAC. PCWP 40, v up to 60 6/24 : Aggressive diuresed. Started on lasix  drip. Brisk diuresis noted.  6/30: Echo with EF 25-30%, RV moderately dysfunctional/mildly dilated, Mitraclip present with severe MR, mild-moderate TR, dilated IVC.  7/3: s/p HM III LVAD and bioprosthetic MVR 7/4: Extubated 7/7: Speed increased to 5800 7/8: Speed increased to 5900 7/9: Decrease Milrinone  to 0.125 mcg. Speed increased to 6000.   Pt sitting up in the chair on my arrival. States she is feeling pretty good today. She has walked multiple laps in the hallway. Has transfer orders to [facility].   Ramp echo completed. Speed increased to 6000 per Dr Bensimhon yesterday. See separate note for documentation. Milrinone  currently at 0.125 mcg.   Continued discharge teaching with pt. Dressing change teaching started with [name] (mom) and pt's cousin. Pt tells me that her mom is coming tomorrow at   Home VAD equipment arrived today.   Vital signs: Temp: 97.3 HR: 79 Doppler Pressure: 78 Automatic BP: 108/87 (96) O2 Sat: not documented Wt: 250.8>246.9>248.4>247.8 lbs  LVAD interrogation reveals:  Speed: 6000 Flow: 4.4 Power:  4.6 w PI: 4.2  Alarms: none Events:  none Hematocrit: 25  Fixed speed: 5900 Low speed limit: 5600  Drive Line: Existing VAD dressing removed and site care performed using sterile technique. Drive line exit site cleaned with Chlora prep applicators x 2, allowed to dry, and gauze dressing with silver strip applied flush to exit site. Exit site healing and unincorporated, the velour is fully implanted at exit site. Suture intact. Moderate serousangenous  drainage. No redness, tenderness, foul odor or rash noted. Drive line anchor secured. Continue daily dressing changes. Next dressing change 07/31/23 by VAD Coordinator or nurse champion.      4 small blisters noted near outer edge of tape under skin fold. Covered blisters with gauze to prevent sticking to tape.   Labs:  LDH trend: 476>390>338>301  INR trend: 1.4>1.8>1.7>1.8  Anticoagulation Plan: -INR Goal: 2.0-2.5 -ASA Dose: n/a  Blood Products:  Intra-op: 2 FFP, 1 Platelet, 2 Cryo Post-op:  07/24/23: 1 PRBC 07/25/23:1 PRBC  Device:  Arrythmias:  HX: NSVT on p.o. amiodarone   Respiratory: extubated 07/25/23  Infection:  Renal:  -BUN/CRT: 24/1.15>1.02>24/0.87>23/0.74  Drips:  Milrinone  0.125 mcg/kg/min  Adverse Events on VAD:  VAD Education: Demonstrated and verbalized dressing change for pt's mother and cousin Discussed importance of cap/mask with every dressing change Mom and cousin practiced don/doff of sterile gloves. Sent them home with gloves to practice with. Discussed Coumadin  therapy and reviewed handout in education binder Reviewed nosebleed handout Reviewed when to call the VAD coordinator Discussed need to notify VAD team if PCP or other provider starts/stops medication Reviewed need for antibiotics pre-dental visits  Reviewed need to sleep/nap only on wall power Discussed she may not shower until drive line completely incorporated, and cleared by VAD coordinator to shower.  Discussed care of modular connection  Plan/Recommendations:  1. Page VAD coordinator with any VAD alarms or equipment issues. 2. Drive line dressing to be changed daily by VAD Coordinator or Oncologist. 3. Will finish d/c teaching tomorrow at 1030.  Lauraine Ip RN VAD Coordinator  Office: [phone]  24/7 Pager: [phone]

## 2023-07-31 NOTE — Plan of Care (Signed)
  Problem: Clinical Measurements: Goal: Respiratory complications will improve Outcome: Progressing   Problem: Clinical Measurements: Goal: Diagnostic test results will improve Outcome: Progressing   Problem: Clinical Measurements: Goal: Will remain free from infection Outcome: Progressing

## 2023-07-31 NOTE — Progress Notes (Addendum)
 Patient ID: [name] [name], female   DOB: [birth year], 48 y.o.   MRN: [id number]     Advanced Heart Failure Rounding Note  Cardiologist: Ria Commander, DO  Chief Complaint: Cardiogenic Shock Subjective:    6/17: mTEER with sev posterior leaflet restriction resulting in tear. IABP + PAC. PCWP 40, v up to 60 6/24 : Aggressive diuresed. Started on lasix  drip. Brisk diuresis noted.  6/30: Echo with EF 25-30%, RV moderately dysfunctional/mildly dilated, Mitraclip present with severe MR, mild-moderate TR, dilated IVC.  7/3: s/p HM III LVAD and bioprosthetic MVR 7/4: Extubated 7/7: LVAD speed increased to 5800 7/8: LVAD speed increased to 5900 7/9: Weaned milrinone  to 0.125. Ramp echo >> speed increased to 6000   CO-OX 54% on 0.125 milrinone  with hgb of 8 and Fick CI of 2.5.   No BMET today  2.2L UOP last 24 hrs with 80 mg lasix  IV X 1. Unable to obtain CVP waveform.   She is now below pre-op weight if accounting for LVAD equipment.  Feeling well. Pain better controlled. Ambulating the halls multiple times a day.  LVAD Interrogation HM III: Speed: 6000 Flow: 4.7 PI: 3.6 Power: 5.  No PI events or alarms  Objective:    Weight Range: 112 kg Body mass index is 35.43 kg/m.   Vital Signs:   Temp:  [97.2 F (36.2 C)-99 F (37.2 C)] 97.4 F (36.3 C) (07/10 0700) Pulse Rate:  [84] 84 (07/09 1800) Resp:  [16-32] 16 (07/10 0900) BP: (70-111)/(53-93) 93/70 (07/10 0900) Weight:  [887 kg] 112 kg (07/10 0700) Last BM Date : 07/28/23  Weight change: Filed Weights   07/30/23 0600 07/30/23 0906 07/31/23 0700  Weight: 113 kg 112.4 kg 112 kg   Intake/Output:  Intake/Output Summary (Last 24 hours) at 07/31/2023 0919 Last data filed at 07/31/2023 0900 Gross per 24 hour  Intake 1075.31 ml  Output 2200 ml  Net -1124.69 ml   Physical Exam   Vitals:   07/31/23 0800 07/31/23 0900  BP: 90/68 93/70  Pulse:    Resp: (!) 22 16  Temp:    SpO2:     Physical Exam: GENERAL: no acute  distress. Sitting up in chair. NECK: JVP 10-12 CARDIAC:  Mechanical heart sounds with LVAD hum present.  LUNGS:  Clear to auscultation bilaterally.  ABDOMEN:  Soft, round, nontender. LVAD exit site:   Dressing dry and intact.  Stabilization device present and accurately applied.   EXTREMITIES:  + TED hose, 1+ edema NEUROLOGIC:  Alert and oriented x 4.  Affect pleasant.         Telemetry   SR 80s  Labs  CBC Recent Labs    07/30/23 0456 07/31/23 0519  WBC 7.7 9.1  NEUTROABS 5.6 7.2  HGB 7.7* 8.0*  HCT 25.1* 25.7*  MCV 89.6 89.9  PLT 254 291   Basic Metabolic Panel Recent Labs    92/91/74 0400 07/30/23 0454 07/30/23 0456 07/30/23 0933 07/31/23 0519  NA 133* 130*  --   --   --   K 4.1 3.8  --   --   --   CL 96* 91*  --   --   --   CO2 30 30  --   --   --   GLUCOSE 93 99  --   --   --   BUN 24* 23*  --   --   --   CREATININE 0.87 0.74  --   --   --   CALCIUM  8.7*  8.6*  --   --   --   MG 1.8  --  >9.0* 1.8 1.9  PHOS 2.3*  --  2.4*  --  2.7   Liver Function Tests No results for input(s): AST, ALT, ALKPHOS, BILITOT, PROT, ALBUMIN  in the last 72 hours.  BNP (last 3 results) Recent Labs    07/04/23 1057 07/25/23 0500 07/31/23 0519  BNP 612.0* 938.1* 856.6*   Fasting Lipid Panel No results for input(s): CHOL, HDL, LDLCALC, TRIG, CHOLHDL, LDLDIRECT in the last 72 hours.  Medications:   Scheduled Medications:  amiodarone   200 mg Oral BID   aspirin  EC  81 mg Oral Daily   Chlorhexidine  Gluconate Cloth  6 each Topical Daily   docusate sodium   200 mg Oral Daily   Fe Fum-Vit C-Vit B12-FA  1 capsule Oral QPC breakfast   gabapentin   300 mg Oral BID   pantoprazole   40 mg Oral Daily   polyethylene glycol  17 g Oral Daily   sodium chloride  flush  10-40 mL Intracatheter Q12H   spironolactone   25 mg Oral Daily   Warfarin - Physician Dosing Inpatient   Does not apply q1600   Infusions:  milrinone  0.125 mcg/kg/min (07/31/23 0900)   PRN  Medications: ondansetron  (ZOFRAN ) IV, mouth rinse, oxyCODONE , phenol, sodium chloride  flush, traMADol   Patient Profile   48 y.o. female with history of chronic systolic HF with biventricular dysfunction on home inotrope, severe MR, hx PE/DVT, obesity, CAD, anemia, tobacco use. Presented for mTEER on 07/09/23. Worsening MR leading to cardiogenic shock. S/p LVAD 07/24/23.   Assessment/Plan  Cardiogenic shock>>>now S/P HM III LVAD 07/24/23: due to torrential MR s/p mTEER complicated by severely restricted posterior leaflet that was damaged during repair attempt, IABP and nipride  needed to stabilize. Transitioned to Impella 5.5. Not a transplant candidate with tobacco use.  Nonobstructive CAD on cath 2023.  - 7/3 HM 3 + bioprosthetic MV replacement.  Noted to have acute/subacute pericarditis with inflammatory exudate over heart and pericardium  - Ramp echo 07/09 > speed increased to 6000 - LVAD parameters stable - CO-OX 54% on 0.125 milrinone . Will continue milrinone  one more day.  - Has diuresed well. Now below pre-op weight when accounting for weight of VAD equipment. - Start Torsemide  40 mg daily - Continue spiro 25 mg daily  HM 3 LVAD - MAP predominately 70s - LDH stable - Continue warfarin, INR 1.9. Warfarin per PharmD. - Continue aspirin  81 mg  for 1 month.   - Has started discharge teaching   Torrential MR - S/p bioprosthetic MVR.    Acute hypoxic respiratory failure Resolved  Hx of PE/DVT w/ IVC filter - Evaluated at [facility] where thrombophilia work-up was negative (Lupus inhibitor, anticardiolipin antibodies, and anti beta-2  glycoprotein antibodies were negative; protein C activity 85%, protein S activity 76%, factor V Leiden, and prothrombin gene mutation ) - IVC filter in place; could not be removed due to significant thrombus burden on filter at [facility]  - Continue Warfarin. INR 1.9.   CAD - LHC in 5/23 w/ nonobstructive CAD involving RCA  Tobacco use - Smoking 3 cigarettes/day at  time of last follow-up.  Anemia  - long h/o IDA, required frequent IV infusions pre-op  - Hgb 7.7 today - Given IV iron  07/07  PVCs - Low burden on telemetry. - Amiodarone  200 mg bid currently.   AKI - Resolved. Scr stable.  Transfer to [facility].    Length of Stay: 22  FINCH, LINDSAY N, PA-C  07/31/2023, 9:19  AM  Advanced Heart Failure Team Pager [phone] (M-F; 7a - 5p)  Please contact [facility] for night-coverage after hours (5p -7a ) and weekends on [email or web address]   Patient seen and examined with the above-signed Advanced Practice Provider and/or Housestaff. I personally reviewed laboratory data, imaging studies and relevant notes. I independently examined the patient and formulated the important aspects of the plan. I have edited the note to reflect any of my changes or salient points. I have personally discussed the plan with the patient and/or family.  Doing well. On milrinone  0.125. Co-ox marginal CVP ~10 Weight close to baseline   Ramp echo yesterday with speed increased to 6000. PI now running 2.5-3.0   INR 1.9 TCTS managing warfarin   General:  NAD.  HEENT: normal  Neck: supple. JVP 10.  Carotids 2+ bilat; no bruits. No lymphadenopathy or thryomegaly appreciated. Cor: LVAD hum.  Lungs: Clear. Abdomen: obese soft, nontender, non-distended. No hepatosplenomegaly. No bruits or masses. Good bowel sounds. Driveline site clean. Anchor in place.  Extremities: no cyanosis, clubbing, rash. Warm tr edema  Neuro: alert & oriented x 3. No focal deficits. Moves all 4 without problem   Progressing well but co-ox marginal. Continue milrinone  today. Start po torsemide    TCTs managing INR  Continue to educate and teach.   VAD interrogated personally. Parameters stable. If PI running < 2.5 will cut speed back to 5900.  Toribio Fuel, MD  12:25 AM

## 2023-07-31 NOTE — Progress Notes (Signed)
   07/31/23 2245  BiPAP/CPAP/SIPAP  BiPAP/CPAP/SIPAP Pt Type Adult  BiPAP/CPAP/SIPAP Resmed  Mask Type Full face mask  Dentures removed? Not applicable  Mask Size Medium  Respiratory Rate 18 breaths/min  EPAP 10 cmH2O  Pressure Support 5 cmH20  PEEP 10 cmH20  FiO2 (%) 36 %  Patient Home Machine No  Patient Home Mask No  Patient Home Tubing No  Auto Titrate No  Press High Alarm 40 cmH2O  CPAP/SIPAP surface wiped down Yes  Device Plugged into RED Power Outlet Yes  BiPAP/CPAP /SiPAP Vitals  Resp 18  SpO2 99 %  Bilateral Breath Sounds Clear  MEWS Score/Color  MEWS Score 1  MEWS Score Color Green

## 2023-07-31 NOTE — Progress Notes (Signed)
 Patient ID: [name] [name], female   DOB: 1975-06-19, 48 y.o.   MRN: [id number]  HeartMate 3 Rounding Note  Subjective:    Feels well. Ambulated last night and this morning.  Pain under control  Milrinone  0.125. Co-ox 54.1.   LVAD INTERROGATION:  HeartMate IIl LVAD:  Flow 4.7 liters/min, speed 6000, power 4.8, PI 3.6.    Objective:    Vital Signs:   Temp:  [97.2 F (36.2 C)-99 F (37.2 C)] 97.4 F (36.3 C) (07/10 0700) Pulse Rate:  [84-86] 84 (07/09 1800) Resp:  [17-32] 25 (07/10 0300) BP: (70-111)/(53-93) 83/66 (07/10 0400) SpO2:  [99 %] 99 % (07/09 0800) Weight:  [887 kg-112.4 kg] 112 kg (07/10 0700) Last BM Date : 07/28/23 Mean arterial Pressure 70's-80's  Intake/Output:   Intake/Output Summary (Last 24 hours) at 07/31/2023 0719 Last data filed at 07/31/2023 0700 Gross per 24 hour  Intake 843.57 ml  Output 2200 ml  Net -1356.43 ml     Physical Exam: General:  Well appearing. No resp difficulty HEENT: normal Neck: normal Cor: Distant heart sounds with LVAD hum present. Lungs: clear Abdomen: soft, nontender, nondistended. Good bowel sounds. Extremities: no significant edema Neuro: alert & orientedx3, cranial nerves grossly intact. moves all 4 extremities w/o difficulty. Affect pleasant  Telemetry: sinus 88  Labs: Basic Metabolic Panel: Recent Labs  Lab 07/27/23 0313 07/27/23 0457 07/27/23 2111 07/28/23 0341 07/28/23 2012 07/29/23 0400 07/30/23 0454 07/30/23 0456 07/30/23 0933 07/31/23 0519  NA 133*   < > 136 135 133* 133* 130*  --   --   --   K 3.4*   < > 3.7 3.6 3.4* 4.1 3.8  --   --   --   CL 96*   < > 98 98 94* 96* 91*  --   --   --   CO2 26   < > 29 28 28 30 30   --   --   --   GLUCOSE 90   < > 99 72 120* 93 99  --   --   --   BUN 33*   < > 34* 33* 28* 24* 23*  --   --   --   CREATININE 1.41*   < > 1.18* 1.02* 1.09* 0.87 0.74  --   --   --   CALCIUM  9.0   < > 8.9 8.7* 8.3* 8.7* 8.6*  --   --   --   MG 2.1  --   --  1.8  --  1.8  --  >9.0* 1.8  1.9  PHOS 4.8*  --   --  3.3  --  2.3*  --  2.4*  --  2.7   < > = values in this interval not displayed.    Liver Function Tests: Recent Labs  Lab 07/25/23 0500 07/26/23 0508 07/27/23 0313  AST 91* 81* 44*  ALT 22 16 11   ALKPHOS 50 56 59  BILITOT 2.2* 2.5* 1.5*  PROT 5.8* 6.2* 6.3*  ALBUMIN  2.6* 2.7* 2.5*   No results for input(s): LIPASE, AMYLASE in the last 168 hours. No results for input(s): AMMONIA in the last 168 hours.  CBC: Recent Labs  Lab 07/27/23 0313 07/27/23 0457 07/28/23 0341 07/29/23 0400 07/30/23 0456 07/31/23 0519  WBC 14.9*  --  11.9* 9.4 7.7 9.1  NEUTROABS 12.2*  --  9.3* 7.2 5.6 7.2  HGB 8.1* 8.5* 7.5* 7.6* 7.7* 8.0*  HCT 25.9* 25.0* 24.6* 24.5* 25.1* 25.7*  MCV 86.6  --  89.5 89.1 89.6 89.9  PLT 182  --  207 223 254 291    INR: Recent Labs  Lab 07/27/23 0313 07/28/23 0341 07/29/23 0400 07/30/23 0456 07/31/23 0519  INR 1.5* 1.8* 1.7* 1.8* 1.9*    Other results: EKG:   Imaging: ECHOCARDIOGRAM LIMITED Result Date: 07/30/2023    ECHOCARDIOGRAM LIMITED REPORT   Patient Name:   [name] [name] Date of Exam: 07/30/2023 Medical Rec #:  [id number]       Height:       70.0 in Accession #:    [id number]      Weight:       247.8 lb Date of Birth:  Dec 24, 1975       BSA:          2.286 m Patient Age:    47 years        BP:           0/0 mmHg Patient Gender: F               HR:           82 bpm. Exam Location:  Inpatient Procedure: Limited Echo and Limited Color Doppler (Both Spectral and Color Flow            Doppler were utilized during procedure).                             MODIFIED REPORT: This report was modified by Redell Shallow MD on 07/30/2023 due to Change.  Indications:     RAMP study  History:         Patient has prior history of Echocardiogram examinations.                   Mitral Valve: 33 mm Medtronic mosaic porcine valve valve is                  present in the mitral position. Procedure Date: 07/09/23.  Sonographer:     Vella Key Referring  Phys:  [id number] TORIBIO JONELLE BENSIMHON Diagnosing Phys: Redell Shallow MD IMPRESSIONS  1. RAMP study. Global hypokinesis with akinesis of the inferolateral wall with overall severe LV dysfunction; LVAD LV apex.  2. Left ventricular ejection fraction, by estimation, is 25 to 30%. The left ventricle has severely decreased function. The left ventricle demonstrates regional wall motion abnormalities (see scoring diagram/findings for description).  3. Right ventricular systolic function is moderately reduced. The right ventricular size is moderately enlarged.  4. Left atrial size was moderately dilated.  5. Right atrial size was moderately dilated.  6. The mitral valve has been repaired/replaced. No evidence of mitral valve regurgitation. There is a 33 mm Medtronic mosaic porcine valve present in the mitral position. Procedure Date: 07/09/23. FINDINGS  Left Ventricle: Left ventricular ejection fraction, by estimation, is 25 to 30%. The left ventricle has severely decreased function. The left ventricle demonstrates regional wall motion abnormalities. Right Ventricle: The right ventricular size is moderately enlarged. Right ventricular systolic function is moderately reduced. Left Atrium: Left atrial size was moderately dilated. Right Atrium: Right atrial size was moderately dilated. Pericardium: There is no evidence of pericardial effusion. Mitral Valve: The mitral valve has been repaired/replaced. There is a 33 mm Medtronic mosaic porcine valve present in the mitral position. Procedure Date: 07/09/23. Tricuspid Valve: Tricuspid valve regurgitation is trivial. Additional Comments: RAMP study. Global hypokinesis with akinesis of the inferolateral wall with overall severe LV dysfunction;  LVAD LV apex.  LEFT VENTRICLE PLAX 2D LVIDd:         5.90 cm LVIDs:         5.25 cm  PULMONIC VALVE PV Vmax:       1.30 m/s PV Vmean:      79.850 cm/s PV VTI:        0.164 m PV Peak grad:  6.7 mmHg PV Mean grad:  3.0 mmHg  Redell Shallow MD  Electronically signed by Redell Shallow MD Signature Date/Time: 07/30/2023/1:04:19 PM    Final (Updated)    US  EKG SITE RITE Result Date: 07/29/2023 If Site Rite image not attached, placement could not be confirmed due to current cardiac rhythm.    Medications:     Scheduled Medications:  amiodarone   200 mg Oral BID   aspirin  EC  81 mg Oral Daily   Chlorhexidine  Gluconate Cloth  6 each Topical Daily   docusate sodium   200 mg Oral Daily   Fe Fum-Vit C-Vit B12-FA  1 capsule Oral QPC breakfast   gabapentin   300 mg Oral BID   pantoprazole   40 mg Oral Daily   polyethylene glycol  17 g Oral Daily   sodium chloride  flush  10-40 mL Intracatheter Q12H   spironolactone   25 mg Oral Daily   Warfarin - Physician Dosing Inpatient   Does not apply q1600    Infusions:  milrinone  0.125 mcg/kg/min (07/31/23 0700)    PRN Medications: ondansetron  (ZOFRAN ) IV, mouth rinse, oxyCODONE , phenol, sodium chloride  flush, traMADol    Assessment:   POD 7 HeartMate 3 LVAD as destination therapy, removal of Impella and mitral valve replacement with 33 mm Medtronic Mosaic porcine valve. Noted to have acute/subacute pericarditis with inflammatory exudate over heart and pericardium.   Hx of stage D non-ischemic cardiomyopathy and chronic systolic heart failure with biventricular dysfunction and severe MR on home milrinone . Failed mTEER with torrential MR and severe pulmonary hypertension, acute hypoxic respiratory failure, cardiogenic shock requiring IABP and then left subclavian Impella 5.5 for stabilization.    Hx of DVT/PE with IVC filter in past, thrombophilia workup negative at [facility] Filter could not be removed due to thrombus burden on filter.   Hx of smoking and COPD by PFT's preop with FEV 1 of 1.09 and FVC of 1.43 but probably affected by heart failure and severe pulm htn from MR. She was walking around the ICU with Impella so I suspect her pulmonary function will not be an issue.   Morbid obesity with  BMI 36. She was moving around well preop with severe MR/severe pulmonary HTN and Impella.   Malnutrition with albumin  2.6 preop although she was eating well recently. Eating well postop.   Expected postop blood loss anemia: she was getting intermittent iron  infusions for chronic anemia at [facility]. Received IV iron  three days ago and still on po iron .    Plan/Discussion:    Hemodynamics stable on milrinone  0.125.  -1356 cc yesterday but wt not changed much. Still about 10 lbs over preop wt but not sure how accurate that is with LVAD and controller.  INR 1.9. Pharmacy can't take over Coumadin  dosing.  Continue IS, ambulation. Doing 750 on IS now. 1000 preop.  Drive line dressing change per VAD Coordinator. She needs a sports bra to keep large pendulous breasts up and away from exit site. Discussed with pt. She does not usually wear a bra at home.    I reviewed the LVAD parameters from today, and compared the results to  the patient's prior recorded data.  No programming changes were made.  The LVAD is functioning within specified parameters.  LVAD interrogation was negative for any significant power changes, alarms or PI events/speed drops.    Length of Stay: [address]  Dorise POUR [facility] 07/31/2023, 7:19 AM

## 2023-07-31 NOTE — Progress Notes (Addendum)
 PHARMACY - ANTICOAGULATION CONSULT NOTE  Pharmacy Consult for warfarin Indication: LVAD  Allergies  Allergen Reactions   Sulfa  Antibiotics Itching, Swelling and Rash    Lip swelling    Patient Measurements: Height: 5' 10 (177.8 cm) Weight: 112 kg (246 lb 14.6 oz) IBW/kg (Calculated) : 68.5 HEPARIN  DW (KG): 94.1  Vital Signs: Temp: 97.3 F (36.3 C) (07/10 1100) Temp Source: Axillary (07/10 1100) BP: 82/62 (07/10 1300) Pulse Rate: 138 (07/10 1300)  Labs: Recent Labs    07/29/23 0400 07/30/23 0454 07/30/23 0456 07/31/23 0519 07/31/23 1000  HGB 7.6*  --  7.7* 8.0*  --   HCT 24.5*  --  25.1* 25.7*  --   PLT 223  --  254 291  --   LABPROT 21.3*  --  21.9* 22.8*  --   INR 1.7*  --  1.8* 1.9*  --   CREATININE 0.87 0.74  --   --  0.84    Estimated Creatinine Clearance: 113.5 mL/min (by C-G formula based on SCr of 0.84 mg/dL).   Medical History: Past Medical History:  Diagnosis Date   Anemia    Blood transfusion without reported diagnosis    CAD (coronary artery disease)    Nonobstructive   Closed left ankle fracture    August 30 2012   Clotting disorder [facility])    DVT (deep venous thrombosis) (HCC)    L leg   Fibroid tumor    HFrEF (heart failure with reduced ejection fraction) (HCC)    Hidradenitis    Hypertension    Mitral regurgitation    Myocardial infarction (HCC)    NICM (nonischemic cardiomyopathy) (HCC)    NSVT (nonsustained ventricular tachycardia) (HCC)    Obesity    OSA on CPAP    Pulmonary embolism (HCC) 04/2013   Sleep apnea     Medications:  Scheduled:   amiodarone   200 mg Oral BID   aspirin  EC  81 mg Oral Daily   Chlorhexidine  Gluconate Cloth  6 each Topical Daily   docusate sodium   200 mg Oral Daily   Fe Fum-Vit C-Vit B12-FA  1 capsule Oral QPC breakfast   gabapentin   300 mg Oral BID   pantoprazole   40 mg Oral Daily   polyethylene glycol  17 g Oral Daily   sodium chloride  flush  10-40 mL Intracatheter Q12H   spironolactone   25 mg  Oral Daily   Warfarin - Physician Dosing Inpatient   Does not apply q1600   Infusions:   milrinone  0.125 mcg/kg/min (07/31/23 1300)    Assessment: 48 yo F who had an LVAD placed on 7/3. PMH significant for HFrEF, severe MR, DVT/PE, pancytopenia. Home warfarin regimen: 10 mg M-F, 5 mg on Sa/Su. Pharmacy has been consulted for warfarin management.  Hgb 8.0 and plts 288 are stable. Patient has been on lower doses of warfarin since LVAD implantation. Received 7.5mg  for the last 2 doses. Eating well and drinking Ensures. On amiodarone  since 7/4 (POD1). LDH slowly decreasing, now at 288.  INR today is 1.9, up from 1.8 yesterday. Still slightly subtherapeutic, will continue with 7.5 mg today.  Goal of Therapy:  INR 2.0-2.5 Monitor platelets by anticoagulation protocol: Yes   Plan:  Warfarin 7.5 mg x1 tonight Monitor daily INR, CBC, signs/symptoms of bleeding   Nidia Schaffer, PharmD PGY2 Cardiology Pharmacy Resident  Please check AMION for all [facility] Pharmacy phone numbers After 10:00 PM, call Main Pharmacy [phone] 07/31/2023,2:00 PM

## 2023-07-31 NOTE — TOC Progression Note (Signed)
 Transition of Care [facility]) - Progression Note    Patient Details  Name: [name] [name] MRN: [id number] Date of Birth: January 03, 1976  [facility] CM/SW Contact  Isaiah Public, LCSWA Phone Number: 07/31/2023, 2:44 PM  Clinical Narrative:     CSW spoke with patient at bedside. Introduced self/role to patient. Patient reports PTA she comes from home with mother and father. Patient confirmed plan when medically ready for discharge is to return home with mother and father. CM following patient to help assist with home needs when patient medically ready for dc. All questions answered. No further questions reported at this time. TOC will continue to follow.  Expected Discharge Plan: IP Rehab Facility Barriers to Discharge: Continued Medical Work up  Expected Discharge Plan and Services   Discharge Planning Services: CM Consult Post Acute Care Choice: IP Rehab Living arrangements for the past 2 months: Single Family Home                           HH Arranged: RN [facility] Agency: [facility] Date HH Agency Contacted: 07/11/23 Time HH Agency Contacted: 1534 Representative spoke with at [facility] Agency: Pam RN   Social Determinants of Health (SDOH) Interventions SDOH Screenings   Food Insecurity: No Food Insecurity (05/20/2023)  Housing: Low Risk  (05/20/2023)  Transportation Needs: No Transportation Needs (05/20/2023)  Utilities: Not At Risk (05/20/2023)  Alcohol  Screen: Low Risk  (04/03/2023)  Depression (PHQ2-9): Low Risk  (05/26/2023)  Financial Resource Strain: Low Risk  (04/03/2023)  Physical Activity: Insufficiently Active (04/03/2023)  Social Connections: Socially Isolated (04/03/2023)  Stress: No Stress Concern Present (04/03/2023)  Tobacco Use: High Risk (07/24/2023)    Readmission Risk Interventions    07/03/2022   12:12 PM 02/13/2022   12:41 PM 12/31/2021    1:52 PM  Readmission Risk Prevention Plan  Transportation Screening Complete Complete   HRI or Home Care Consult  Complete Complete Complete  Social Work Consult for Recovery Care Planning/Counseling Complete Complete Complete  Palliative Care Screening Not Applicable Not Applicable Not Applicable  Medication Review Oceanographer) Complete Complete Complete

## 2023-08-01 DIAGNOSIS — D649 Anemia, unspecified: Secondary | ICD-10-CM | POA: Diagnosis not present

## 2023-08-01 DIAGNOSIS — Z952 Presence of prosthetic heart valve: Secondary | ICD-10-CM

## 2023-08-01 DIAGNOSIS — R570 Cardiogenic shock: Secondary | ICD-10-CM | POA: Diagnosis not present

## 2023-08-01 DIAGNOSIS — Z95828 Presence of other vascular implants and grafts: Secondary | ICD-10-CM

## 2023-08-01 DIAGNOSIS — Z9889 Other specified postprocedural states: Secondary | ICD-10-CM

## 2023-08-01 LAB — CBC WITH DIFFERENTIAL/PLATELET
Abs Immature Granulocytes: 0.06 K/uL (ref 0.00–0.07)
Basophils Absolute: 0.1 K/uL (ref 0.0–0.1)
Basophils Relative: 1 %
Eosinophils Absolute: 0.3 K/uL (ref 0.0–0.5)
Eosinophils Relative: 3 %
HCT: 27.6 % — ABNORMAL LOW (ref 36.0–46.0)
Hemoglobin: 8.5 g/dL — ABNORMAL LOW (ref 12.0–15.0)
Immature Granulocytes: 1 %
Lymphocytes Relative: 9 %
Lymphs Abs: 0.7 K/uL (ref 0.7–4.0)
MCH: 27.8 pg (ref 26.0–34.0)
MCHC: 30.8 g/dL (ref 30.0–36.0)
MCV: 90.2 fL (ref 80.0–100.0)
Monocytes Absolute: 0.7 K/uL (ref 0.1–1.0)
Monocytes Relative: 8 %
Neutro Abs: 6.8 K/uL (ref 1.7–7.7)
Neutrophils Relative %: 78 %
Platelets: 361 K/uL (ref 150–400)
RBC: 3.06 MIL/uL — ABNORMAL LOW (ref 3.87–5.11)
RDW: 20.9 % — ABNORMAL HIGH (ref 11.5–15.5)
WBC: 8.7 K/uL (ref 4.0–10.5)
nRBC: 0 % (ref 0.0–0.2)

## 2023-08-01 LAB — COOXEMETRY PANEL
Carboxyhemoglobin: 1.5 % (ref 0.5–1.5)
Carboxyhemoglobin: 1.5 % (ref 0.5–1.5)
Methemoglobin: <0.7 % (ref 0.0–1.5)
Methemoglobin: 0.8 % (ref 0.0–1.5)
O2 Saturation: 55 %
O2 Saturation: 46.2 %
Total hemoglobin: 9 g/dL — ABNORMAL LOW (ref 12.0–16.0)
Total hemoglobin: 9.6 g/dL — ABNORMAL LOW (ref 12.0–16.0)

## 2023-08-01 LAB — PROTIME-INR
INR: 2.3 — ABNORMAL HIGH (ref 0.8–1.2)
Prothrombin Time: 26.1 s — ABNORMAL HIGH (ref 11.4–15.2)

## 2023-08-01 LAB — LACTATE DEHYDROGENASE: LDH: 264 U/L — ABNORMAL HIGH (ref 98–192)

## 2023-08-01 LAB — GLUCOSE, CAPILLARY
Glucose-Capillary: 65 mg/dL — ABNORMAL LOW (ref 70–99)
Glucose-Capillary: 100 mg/dL — ABNORMAL HIGH (ref 70–99)
Glucose-Capillary: 87 mg/dL (ref 70–99)
Glucose-Capillary: 100 mg/dL — ABNORMAL HIGH (ref 70–99)

## 2023-08-01 LAB — BASIC METABOLIC PANEL WITH GFR
Anion gap: 9 (ref 5–15)
BUN: 13 mg/dL (ref 6–20)
CO2: 32 mmol/L (ref 22–32)
Calcium: 8.7 mg/dL — ABNORMAL LOW (ref 8.9–10.3)
Chloride: 93 mmol/L — ABNORMAL LOW (ref 98–111)
Creatinine, Ser: 0.86 mg/dL (ref 0.44–1.00)
GFR, Estimated: >60 mL/min (ref >60)
Glucose, Bld: 97 mg/dL (ref 70–99)
Potassium: 4 mmol/L (ref 3.5–5.1)
Sodium: 134 mmol/L — ABNORMAL LOW (ref 135–145)

## 2023-08-01 LAB — MAGNESIUM: Magnesium: 1.8 mg/dL (ref 1.7–2.4)

## 2023-08-01 LAB — PHOSPHORUS: Phosphorus: 3.3 mg/dL (ref 2.5–4.6)

## 2023-08-01 MED ORDER — MILRINONE LACTATE IN DEXTROSE 20-5 MG/100ML-% IV SOLN
0.1250 ug/kg/min | INTRAVENOUS | Status: DC
  Administered 2023-08-01 – 2023-08-03 (×3): 0.125 ug/kg/min via INTRAVENOUS
  Filled 2023-08-01 (×2): qty 100

## 2023-08-01 MED ORDER — FUROSEMIDE 10 MG/ML IJ SOLN
80.0000 mg | Freq: Once | INTRAMUSCULAR | Status: AC
  Administered 2023-08-01: 80 mg via INTRAVENOUS
  Filled 2023-08-01: qty 8

## 2023-08-01 MED ORDER — OXYCODONE HCL 5 MG PO TABS
5.0000 mg | ORAL_TABLET | ORAL | Status: DC | PRN
  Administered 2023-08-01 – 2023-08-05 (×15): 5 mg via ORAL
  Filled 2023-08-01 (×15): qty 1

## 2023-08-01 MED ORDER — WARFARIN - PHARMACIST DOSING INPATIENT
Freq: Every day | Status: DC
  Administered 2023-08-01: 1

## 2023-08-01 MED ORDER — ACETAMINOPHEN 500 MG PO TABS
500.0000 mg | ORAL_TABLET | Freq: Three times a day (TID) | ORAL | Status: DC
  Administered 2023-08-01 – 2023-08-05 (×14): 500 mg via ORAL
  Filled 2023-08-01 (×13): qty 1

## 2023-08-01 MED ORDER — SENNOSIDES-DOCUSATE SODIUM 8.6-50 MG PO TABS
1.0000 | ORAL_TABLET | Freq: Every day | ORAL | Status: DC
  Administered 2023-08-01 – 2023-08-05 (×4): 1 via ORAL
  Filled 2023-08-01 (×5): qty 1

## 2023-08-01 MED ORDER — WARFARIN SODIUM 5 MG PO TABS
5.0000 mg | ORAL_TABLET | Freq: Once | ORAL | Status: AC
  Administered 2023-08-01: 5 mg via ORAL
  Filled 2023-08-01: qty 1

## 2023-08-01 MED ORDER — TRAMADOL HCL 50 MG PO TABS
50.0000 mg | ORAL_TABLET | ORAL | Status: DC | PRN
  Administered 2023-08-03 – 2023-08-05 (×4): 50 mg via ORAL
  Filled 2023-08-01 (×4): qty 1

## 2023-08-01 MED ORDER — MAGNESIUM SULFATE 4 GM/100ML IV SOLN
4.0000 g | Freq: Once | INTRAVENOUS | Status: AC
  Administered 2023-08-01: 4 g via INTRAVENOUS
  Filled 2023-08-01: qty 100

## 2023-08-01 NOTE — TOC Progression Note (Signed)
 Transition of Care [facility]) - Progression Note    Patient Details  Name: [name] [name] MRN: [id number] Date of Birth: 10/27/1975  Transition of Care [facility]) CM/SW Contact  Justina Delcia Czar, RN Phone Number: [phone] 08/01/2023, 7:15 PM  Clinical Narrative:    Spoke to pt and discussed [place] [employer] PCS services. Will send form to her PCP or HF Team office RN, Heather  to complete. Will order DME closer to DME. Pt active with [facility] for [facility]. Will need HHRN and HHPT.     Expected Discharge Plan: IP Rehab Facility Barriers to Discharge: Continued Medical Work up  Expected Discharge Plan and Services   Discharge Planning Services: CM Consult Post Acute Care Choice: IP Rehab Living arrangements for the past 2 months: Single Family Home                           HH Arranged: RN [facility] Agency: [facility] Date HH Agency Contacted: 07/11/23 Time HH Agency Contacted: 1534 Representative spoke with at [facility] Agency: Pam RN   Social Determinants of Health (SDOH) Interventions SDOH Screenings   Food Insecurity: No Food Insecurity (05/20/2023)  Housing: Low Risk  (05/20/2023)  Transportation Needs: No Transportation Needs (05/20/2023)  Utilities: Not At Risk (05/20/2023)  Alcohol  Screen: Low Risk  (04/03/2023)  Depression (PHQ2-9): Low Risk  (05/26/2023)  Financial Resource Strain: Low Risk  (04/03/2023)  Physical Activity: Insufficiently Active (04/03/2023)  Social Connections: Socially Isolated (04/03/2023)  Stress: No Stress Concern Present (04/03/2023)  Tobacco Use: High Risk (07/24/2023)    Readmission Risk Interventions    07/03/2022   12:12 PM 02/13/2022   12:41 PM 12/31/2021    1:52 PM  Readmission Risk Prevention Plan  Transportation Screening Complete Complete   HRI or Home Care Consult Complete Complete Complete  Social Work Consult for Recovery Care Planning/Counseling Complete Complete Complete  Palliative Care Screening Not Applicable Not Applicable Not Applicable   Medication Review Oceanographer) Complete Complete Complete

## 2023-08-01 NOTE — Progress Notes (Signed)
 Patient ID: [name] [name], female   DOB: 14-Jun-1975, 48 y.o.   MRN: [id number]  HeartMate 3 Rounding Note  Subjective:    Feeling well. Ambulating without difficulty. Eating well, bowels working.  Milrinone  0.125 with Co-ox 55 this am  -1500 cc yesterday. Wt up 2 lbs if accurate.   LVAD INTERROGATION:  HeartMate IIl LVAD:  Flow 4.6 liters/min, speed 6000, power 5, PI 2.1.    Objective:    Vital Signs:   Temp:  [97.2 F (36.2 C)-98.9 F (37.2 C)] 98.9 F (37.2 C) (07/11 0341) Pulse Rate:  [82-138] 92 (07/10 2131) Resp:  [12-36] 23 (07/11 0645) BP: (82-115)/(62-83) 107/83 (07/11 0400) SpO2:  [63 %-100 %] 99 % (07/11 0400) FiO2 (%):  [36 %] 36 % (07/10 2245) Weight:  [112.9 kg] 112.9 kg (07/11 0500) Last BM Date : 07/28/23 Mean arterial Pressure 70's-80's  Intake/Output:   Intake/Output Summary (Last 24 hours) at 08/01/2023 0718 Last data filed at 08/01/2023 0600 Gross per 24 hour  Intake 621.77 ml  Output 2100 ml  Net -1478.23 ml     Physical Exam: General:  Well appearing. No resp difficulty HEENT: normal Neck: normal. Cor: Distant heart sounds with LVAD hum present. Lungs: clear Abdomen: soft, nontender, nondistended. Good bowel sounds. Extremities: mild edema in feet Neuro: alert & orientedx3, cranial nerves grossly intact. moves all 4 extremities w/o difficulty. Affect pleasant  Telemetry: sinus 80's  Labs: Basic Metabolic Panel: Recent Labs  Lab 07/28/23 0341 07/28/23 2012 07/29/23 0400 07/30/23 0454 07/30/23 0456 07/30/23 0933 07/31/23 0519 07/31/23 1000 08/01/23 0544  NA 135 133* 133* 130*  --   --   --  131* 134*  K 3.6 3.4* 4.1 3.8  --   --   --  3.7 4.0  CL 98 94* 96* 91*  --   --   --  90* 93*  CO2 28 28 30 30   --   --   --  31 32  GLUCOSE 72 120* 93 99  --   --   --  102* 97  BUN 33* 28* 24* 23*  --   --   --  15 13  CREATININE 1.02* 1.09* 0.87 0.74  --   --   --  0.84 0.86  CALCIUM  8.7* 8.3* 8.7* 8.6*  --   --   --  8.9 8.7*  MG 1.8   --  1.8  --  >9.0* 1.8 1.9  --  1.8  PHOS 3.3  --  2.3*  --  2.4*  --  2.7  --  3.3    Liver Function Tests: Recent Labs  Lab 07/26/23 0508 07/27/23 0313  AST 81* 44*  ALT 16 11  ALKPHOS 56 59  BILITOT 2.5* 1.5*  PROT 6.2* 6.3*  ALBUMIN  2.7* 2.5*   No results for input(s): LIPASE, AMYLASE in the last 168 hours. No results for input(s): AMMONIA in the last 168 hours.  CBC: Recent Labs  Lab 07/28/23 0341 07/29/23 0400 07/30/23 0456 07/31/23 0519 08/01/23 0544  WBC 11.9* 9.4 7.7 9.1 8.7  NEUTROABS 9.3* 7.2 5.6 7.2 6.8  HGB 7.5* 7.6* 7.7* 8.0* 8.5*  HCT 24.6* 24.5* 25.1* 25.7* 27.6*  MCV 89.5 89.1 89.6 89.9 90.2  PLT 207 223 254 291 361    INR: Recent Labs  Lab 07/28/23 0341 07/29/23 0400 07/30/23 0456 07/31/23 0519 08/01/23 0544  INR 1.8* 1.7* 1.8* 1.9* 2.3*    Other results: EKG:   Imaging: ECHOCARDIOGRAM LIMITED Result Date:  07/30/2023    ECHOCARDIOGRAM LIMITED REPORT   Patient Name:   [name] [name] Date of Exam: 07/30/2023 Medical Rec #:  [id number]       Height:       70.0 in Accession #:    [id number]      Weight:       247.8 lb Date of Birth:  Aug 15, 1975       BSA:          2.286 m Patient Age:    47 years        BP:           0/0 mmHg Patient Gender: F               HR:           82 bpm. Exam Location:  Inpatient Procedure: Limited Echo and Limited Color Doppler (Both Spectral and Color Flow            Doppler were utilized during procedure).                             MODIFIED REPORT: This report was modified by Redell Shallow MD on 07/30/2023 due to Change.  Indications:     RAMP study  History:         Patient has prior history of Echocardiogram examinations.                   Mitral Valve: 33 mm Medtronic mosaic porcine valve valve is                  present in the mitral position. Procedure Date: 07/09/23.  Sonographer:     Vella Key Referring Phys:  [id number] TORIBIO JONELLE BENSIMHON Diagnosing Phys: Redell Shallow MD IMPRESSIONS  1. RAMP study. Global  hypokinesis with akinesis of the inferolateral wall with overall severe LV dysfunction; LVAD LV apex.  2. Left ventricular ejection fraction, by estimation, is 25 to 30%. The left ventricle has severely decreased function. The left ventricle demonstrates regional wall motion abnormalities (see scoring diagram/findings for description).  3. Right ventricular systolic function is moderately reduced. The right ventricular size is moderately enlarged.  4. Left atrial size was moderately dilated.  5. Right atrial size was moderately dilated.  6. The mitral valve has been repaired/replaced. No evidence of mitral valve regurgitation. There is a 33 mm Medtronic mosaic porcine valve present in the mitral position. Procedure Date: 07/09/23. FINDINGS  Left Ventricle: Left ventricular ejection fraction, by estimation, is 25 to 30%. The left ventricle has severely decreased function. The left ventricle demonstrates regional wall motion abnormalities. Right Ventricle: The right ventricular size is moderately enlarged. Right ventricular systolic function is moderately reduced. Left Atrium: Left atrial size was moderately dilated. Right Atrium: Right atrial size was moderately dilated. Pericardium: There is no evidence of pericardial effusion. Mitral Valve: The mitral valve has been repaired/replaced. There is a 33 mm Medtronic mosaic porcine valve present in the mitral position. Procedure Date: 07/09/23. Tricuspid Valve: Tricuspid valve regurgitation is trivial. Additional Comments: RAMP study. Global hypokinesis with akinesis of the inferolateral wall with overall severe LV dysfunction; LVAD LV apex.  LEFT VENTRICLE PLAX 2D LVIDd:         5.90 cm LVIDs:         5.25 cm  PULMONIC VALVE PV Vmax:       1.30 m/s PV Vmean:      79.850 cm/s  PV VTI:        0.164 m PV Peak grad:  6.7 mmHg PV Mean grad:  3.0 mmHg  Redell Shallow MD Electronically signed by Redell Shallow MD Signature Date/Time: 07/30/2023/1:04:19 PM    Final (Updated)       Medications:     Scheduled Medications:  amiodarone   200 mg Oral BID   aspirin  EC  81 mg Oral Daily   Chlorhexidine  Gluconate Cloth  6 each Topical Daily   docusate sodium   200 mg Oral Daily   Fe Fum-Vit C-Vit B12-FA  1 capsule Oral QPC breakfast   gabapentin   300 mg Oral BID   pantoprazole   40 mg Oral Daily   polyethylene glycol  17 g Oral Daily   sodium chloride  flush  10-40 mL Intracatheter Q12H   spironolactone   25 mg Oral Daily   torsemide   40 mg Oral Daily   Warfarin - Physician Dosing Inpatient   Does not apply q1600    Infusions:  milrinone  0.125 mcg/kg/min (08/01/23 0600)    PRN Medications: ondansetron  (ZOFRAN ) IV, mouth rinse, oxyCODONE , phenol, sodium chloride  flush, traMADol    Assessment:   POD 7 HeartMate 3 LVAD as destination therapy, removal of Impella and mitral valve replacement with 33 mm Medtronic Mosaic porcine valve. Noted to have acute/subacute pericarditis with inflammatory exudate over heart and pericardium.   Hx of stage D non-ischemic cardiomyopathy and chronic systolic heart failure with biventricular dysfunction and severe MR on home milrinone . Failed mTEER with torrential MR and severe pulmonary hypertension, acute hypoxic respiratory failure, cardiogenic shock requiring IABP and then left subclavian Impella 5.5 for stabilization.    Hx of DVT/PE with IVC filter in past, thrombophilia workup negative at [facility]. Filter could not be removed due to thrombus burden on filter.   Hx of smoking and COPD by PFT's preop with FEV 1 of 1.09 and FVC of 1.43 but probably affected by heart failure and severe pulm htn from MR. She was walking around the ICU with Impella so I suspect her pulmonary function will not be an issue.   Morbid obesity with BMI 36. She was moving around well preop with severe MR/severe pulmonary HTN and Impella.   Malnutrition with albumin  2.6 preop although she was eating well recently. Eating well postop.   Expected postop blood  loss anemia: she was getting intermittent iron  infusions for chronic anemia at [facility]. Received IV iron  four days ago and still on po iron .  Plan/Discussion:    Hemodynamics stable. Milrinone  per AHF team.  Continues to diurese well with Torsemide . Renal function normal.  INR Therapeutic. Coumadin  per pharmacy.  Continue drive line dressing changes, teaching and mobilization.  She is waiting on 2C bed.   I will be out of town for the next week. Further care as directed by the AHF team.  I reviewed the LVAD parameters from today, and compared the results to the patient's prior recorded data.  No programming changes were made.  The LVAD is functioning within specified parameters.   Length of Stay: [address]  Dorise POUR [facility] 08/01/2023, 7:18 AM

## 2023-08-01 NOTE — Progress Notes (Signed)
 VAD Discharge Teaching Note:  Discharge VAD teaching completed with [name] [name] and her mom [name] [name].  The home inspection checklist has been reviewed and no unsafe conditions have been identified. Family reports that there are at least two dedicated grounded, 3-prong outlets with clearly labeled circuit breaker has been established in the bedroom for power module and Magazine features editor.   Both patient and caregiver have been trained on the following:  1. HM III LVAD overview of system operations  2. Overview of major lifestyle accommodations and cautions   3. Overview of system components (features and functions) 4. Changing power sources 5. Overview of alerts and alarms 6. How to identify and manage an emergency including when pump is running and when pump has stopped  7. Changing system controller 8. Maintain emergency contact list and medications  The patient and caregiver have successfully demonstrated:  1. Changing power source (from batteries to mobile power unit, mobile power unit to batteries, and replacing batteries) 2. Perform system controller self test  3. Check and charge batteries  4. Change system controller 5. Paged VAD pager and programmed number in phones  A daily flow sheet with patient  weight, temperature,  flow, speed, power, and PI, along with daily self checks on system controller and power module have been performed by patient and caregiver during hospitalization and will also be done daily at home.   The following routine activities and maintenance have been reviewed with patient and caregiver and both verbalize understanding:  1. Stressed importance of never disconnecting power from both controller power leads at the same time, and never disconnecting both batteries at the same time, or the pump will alarm and eventually stop if no power supply restored.  2. Plug the mobile power unit (MPU) and the universal battery charger (UBC) into properly grounded (3 prong)  outlets dedicated to PM use. Do NOT use adapter (cheater plug) for ungrounded outlets or multiple portable socket outlets (power strips) 3. Do not connect the PM or MPU to an outlet controlled by wall switch or the device may not work 4. Transfer from MPU to batteries during [facility] mains power failure. The PM has internal backup battery that will power the pump while you transfer to batteries 5. Keep a backup system controller, charged batteries, battery clips, and flashlight near you during sleep in case of electrical power outage 6. Clean battery, battery clip, and universal battery charger contacts weekly 7. Visually inspect percutaneous lead daily 8. Check cables and connectors when changing power source  9. Rotate batteries; keep all eight batteries charged 10. Always have backup system controller, battery clips, fully charged batteries, and spare fully charged batteries when traveling 11. Re-calibrate batteries every 70 uses; monitor battery life of 36 months or 360 uses; replace batteries at end of battery life   Identified the following changes in activities of daily living with pump:  1. No driving for at least six weeks and then only if doctor gives permission to do so 2. No tub baths while pump implanted, and shower only if doctor gives permission 3. No swimming or submersion in water while implanted with pump 4. Keep all VAD equipment away from water or moisture 5. Keep all VAD connections clean and dry 6. No contact sports or engage in jumping activities 7. Never have an MRI while implanted with the pump 8. Never leave or store batteries in extremely hot or cold places (such as   trunk of your car), or the battery life will  be shortened 9. Call the doctor or hospital contact person if any change in how the pump sounds, feels, or works 10. Plan to sleep only when connected to the mobile power unit. [name] [name] 11. Keep a backup system controller, charged batteries, battery clips, and flashlight  near you during sleep in case of electrical power outage 12. Do not sleep on your stomach 13. Talk with doctor before any long distance travel plans 14. Patient will need antibiotics prior to any dental procedure; instructed to contact VAD coordinator before any dental procedures (including routine cleaning)   Discharge binder given to patient and include the following: 1. List of emergency contacts 2. Wallet card 3. HM III Luggage tags 4. HM III Alarms for Patients and their Caregivers 5. HM III Patient Handbook 6. HM III Patient Education Program DVD 7. Daily diary sheets 8. Warfarin teaching sheets 9. Nosebleed teaching sheets 10. Medications you may and may not take with CHF list   Discussed frequency and importance of INR checks; emphasized importance of maintaining INR goal to prevent clotting and or bleeding issues with pump.  Patient able to answer questions and asked good questions pertaining to warfarin and diet/lifestyle changes necessary to be successful and safe.  Patient will have INR managed by VAD Clinic; current INR goal is 2.0 - 2.5.    The patient has completed a proficiency test for the HM III and all questions have been answered. The pt and family have been instructed to call if any questions, problems, or concerns arise. Pt and caregiver successfully paged VAD coordinator using VAD pager emergency number and have been instructed to use this number only for emergencies. Patient and caregiver asked appropriate questions, had good interaction with VAD coordinator, and verbalized understanding of above instructions.   Pt will need to practice paging the VAD pager before d/c.  Lauraine Ip, RN VAD Coordinator  Office: [phone] 24/7 VAD Pager: [phone]

## 2023-08-01 NOTE — Progress Notes (Signed)
 Physical Therapy Treatment Patient Details Name: [name] [name] MRN: [id number] DOB: 28-Jul-1975 Today's Date: 08/01/2023   History of Present Illness 48 yo presented 07/09/23 for transcatheter mitral valve repair with Mitraclip. Post-op severe residual MR and cardiogenic shock. 6/19 impella inserted; extubated 6/20;. S/p HM III LVAD and bioprosthetic MVR 7/3.  PMH-chronic HF (on home milrinone ); CAD, DVT, fibroid tumor, hidradenitis, cardiomyopathy, NSVT, PE, obesity    PT Comments  Good progress towards functional goals. Recalls precautions and items required in LVAD kit when mobilizing. Completed 1 of 2 lead connections without assist to battery, and 2 of 2 lead connections without assist back to main power source. Able to ambulate two distances 300 feet without RW, and 450 feet with RW. Transfer training, supervision level for safety. CGA for gait at this time. Patient will continue to benefit from skilled physical therapy services to further improve independence with functional mobility.     If plan is discharge home, recommend the following: Assistance with cooking/housework;Assist for transportation;Help with stairs or ramp for entrance;A little help with walking and/or transfers;A little help with bathing/dressing/bathroom   Can travel by private Psychologist, clinical (4 wheels);BSC/3in1    Recommendations for Other Services       Precautions / Restrictions Precautions Precautions: Sternal;Fall;Other (comment) Recall of Precautions/Restrictions: Intact Precaution/Restrictions Comments: LVAD Required Braces or Orthoses:  (LVAD kit) Restrictions Other Position/Activity Restrictions: Sternal precautions     Mobility  Bed Mobility Overal bed mobility: Needs Assistance Bed Mobility: Supine to Sit, Sit to Supine     Supine to sit: HOB elevated, Supervision     General bed mobility comments: Supervision for safety to rise to EOB, cues for  technique, no physical assist, but managed lines.    Transfers Overall transfer level: Needs assistance Equipment used: None Transfers: Sit to/from Stand Sit to Stand: Supervision           General transfer comment: Supervision for safety, performed from bed x2 and BSC x1. Slower rise from [facility] but stable with good recall of hand placement.    Ambulation/Gait Ambulation/Gait assistance: Contact guard assist Gait Distance (Feet): 300 Feet (+450) Assistive device: None, Rolling walker (2 wheels) Gait Pattern/deviations: Step-through pattern, Decreased stride length, Wide base of support, Shuffle, Trunk flexed Gait velocity: dec Gait velocity interpretation: 1.31 - 2.62 ft/sec, indicative of limited community ambulator   General Gait Details: First bout completed 300 feet without AD, moderate dyspnea and earlier fatigue, CGA for safety, close proximity to walls while turning corners, needs cues for awareness and to leave room for error. Second bout 450 feet with RW, cues for technique and mindfulness of precautions. Lightly touching for support and efficency. Stable without LOB, supervision level with this device.   Stairs             Wheelchair Mobility     Tilt Bed    Modified Rankin (Stroke Patients Only)       Balance Overall balance assessment: Needs assistance Sitting-balance support: Feet supported, No upper extremity supported Sitting balance-Leahy Scale: Good     Standing balance support: During functional activity, No upper extremity supported Standing balance-Leahy Scale: Fair Standing balance comment: no UB support                            Communication Communication Communication: No apparent difficulties  Cognition Arousal: Alert Behavior During Therapy: WFL for tasks assessed/performed   PT -  Cognitive impairments: No family/caregiver present to determine baseline, Problem solving                         Following  commands: Intact      Cueing Cueing Techniques: Verbal cues  Exercises Other Exercises Other Exercises: States she has been compliant with all exercises    General Comments General comments (skin integrity, edema, etc.): LVAD education. Able to connect to batter 1 of 2 leads, needing help with the other. Able to re-connect 2 of 2 from batteries to main power source. Verbalized precautions. Recalls all items required in LVAD kit.      Pertinent Vitals/Pain Pain Assessment Pain Assessment: Faces Faces Pain Scale: Hurts a little bit Pain Location: sternum Pain Descriptors / Indicators: Guarding, Sore Pain Intervention(s): Monitored during session, Repositioned    Home Living                          Prior Function            PT Goals (current goals can now be found in the care plan section) Acute Rehab PT Goals Patient Stated Goal: to get better PT Goal Formulation: With patient Time For Goal Achievement: 08/09/23 Potential to Achieve Goals: Good Progress towards PT goals: Progressing toward goals    Frequency    Min 2X/week      PT Plan      Co-evaluation              AM-PAC PT 6 Clicks Mobility   Outcome Measure  Help needed turning from your back to your side while in a flat bed without using bedrails?: A Little Help needed moving from lying on your back to sitting on the side of a flat bed without using bedrails?: A Little Help needed moving to and from a bed to a chair (including a wheelchair)?: A Little Help needed standing up from a chair using your arms (e.g., wheelchair or bedside chair)?: A Little Help needed to walk in hospital room?: A Little Help needed climbing 3-5 steps with a railing? : A Little 6 Click Score: 18    End of Session Equipment Utilized During Treatment: Other (comment);Oxygen (LVAD kit) Activity Tolerance: Patient tolerated treatment well Patient left: with call bell/phone within reach;in chair;with chair alarm  set Nurse Communication: Mobility status PT Visit Diagnosis: Unsteadiness on feet (R26.81);Muscle weakness (generalized) (M62.81);Difficulty in walking, not elsewhere classified (R26.2);Pain;Other abnormalities of gait and mobility (R26.89) Pain - part of body:  (sternum)     Time: 8473-8441 PT Time Calculation (min) (ACUTE ONLY): 32 min  Charges:    $Gait Training: 8-22 mins $Self Care/Home Management: 8-22 PT General Charges $$ ACUTE PT VISIT: 1 Visit                     Leontine Roads, PT, DPT [facility]  Rehabilitation Services Physical Therapist Office: [phone] Website: [email or web address]    Leontine GORMAN Roads 08/01/2023, 4:43 PM

## 2023-08-01 NOTE — Discharge Summary (Signed)
 Advanced Heart Failure Team  Discharge Summary   Patient ID: [name] [name] MRN: [id number], DOB/AGE: [birth year] 48 y.o. Admit date: 07/09/2023 D/C date:     08/05/2023   Primary Discharge Diagnoses:  Cardiogenic shock HM III LVAD Torrential MR  Secondary Discharge Diagnoses:  Acute hypoxic respiratory failure Hx PE/DVT w/ IVC filter Tobacco use Iron  deficiency anemia  Hospital Course:   [name] is a 48 year old female with history of inotrope dependent stage D systolic HF/NICM, morbid obesity hx saddle PE/DVT s/p IVC filter, tobacco use.   She's had multiple admissions with acute on chronic CHF over the last few years. She was most recently admitted in 04/25 with cardiogenic shock. Echo with EF 20-25%, severely reduced RV and moderate to severe MR. Required inotrope and pressor support for diuresis. She was discharged home with milrinone .   She subsequently had some improvement in LV function but struggled with NYHA IV symptoms d/t severe MR. She presented for mTEER on 07/09/23. 1 clip placed but procedure aborted d/t worsening MR and shock. IABP placed and she was stabilized with inotrope support and Nipride . Case discussed in MRB and she was felt to be a reasonable candidate for LVAD. She subsequently underwent Impella 5.5 placement followed by HM III VAD and bioprosthetic mitral valve replacement by Dr. Lucas on 07/24/23. She did well post-operatively. RAMP echo performed, speed increased to 6000. Milrinone  weaned off 7/11, however restarted briefly for an initial drop in coox. Wean successfully from Milrinone  on 7/14.    LVAD teaching completed. She has close follow-up arranged in VAD clinic.   Hospital Course by Problem List:  Cardiogenic shock s/p HM3 Implantation + MVR 07/24/23, resolved: due to torrential MR s/p mTEER complicated by severely restricted posterior leaflet that was damaged during repair attempt, IABP and nipride  needed to stabilize. Transitioned to Impella 5.5.  Not a transplant candidate with tobacco use.   - s/p HM 3 + bioprosthetic MVR. Noted to have acute/subacute pericarditis with inflammatory exudate over heart and pericardium  - RAMP echo 7/9 > speed increased to 6000 - Milrinone  stopped 7/11 briefly restarted. Stopped 7/14. - Euvolemic. Continue torsemide  40 mg + KCL 20 mEq daily  - Continue digoxin  0.125 mg daily - Continue spiro 25 mg daily   HM 3 LVAD 07/24/23 - LDH ok. INR 2.2. Speed 6000. - No ASA. Warfarin 7.5 mg TThSa and 5 mg all other days.  - Pt and family teaching complete   Torrential MR - S/p bioprosthetic MVR    Hx of PE/DVT w/ IVC filter - Evaluated at [facility] where thrombophilia work-up was negative (Lupus inhibitor, anticardiolipin antibodies, and anti beta-2  glycoprotein antibodies were negative; protein C activity 85%, protein S activity 76%, factor V Leiden, and prothrombin gene mutation ) - IVC filter in place; could not be removed due to significant thrombus burden on filter at [facility]  - Continue warfarin   CAD - LHC in 5/23 w/ nonobstructive CAD involving RCA - no s/s angina   Tobacco use - Smoking 3 cigarettes/day at time of last follow-up   Anemia  - long h/o IDA - Given IV Fe; continue PO Fe   PVCs: improved - continue amio to 200 mg daily (decreased 7/15)   Discharge Weight Range: 250 lbs Discharge Vitals: Blood pressure (!) 108/90, pulse 89, temperature 97.7 F (36.5 C), temperature source Oral, resp. rate 16, height 5' 10 (1.778 m), weight 113.4 kg, SpO2 93%.  Labs: Lab Results  Component Value Date   WBC  9.8 08/05/2023   HGB 8.7 (L) 08/05/2023   HCT 28.9 (L) 08/05/2023   MCV 90.3 08/05/2023   PLT 252 08/05/2023    Recent Labs  Lab 08/05/23 0510  NA 134*  K 3.7  CL 95*  CO2 29  BUN 15  CREATININE 1.16*  CALCIUM  8.8*  GLUCOSE 83   Lab Results  Component Value Date   CHOL 109 07/17/2023   HDL 47 07/17/2023   LDLCALC 54 07/17/2023   TRIG 38 07/17/2023   BNP (last 3 results) Recent  Labs    07/04/23 1057 07/25/23 0500 07/31/23 0519  BNP 612.0* 938.1* 856.6*    ProBNP (last 3 results) No results for input(s): PROBNP in the last 8760 hours.   Diagnostic Studies/Procedures   No results found.  Discharge Medications   Allergies as of 08/05/2023       Reactions   Sulfa  Antibiotics Itching, Swelling, Rash   Lip swelling        Medication List     STOP taking these medications    bumetanide  2 MG tablet Commonly known as: BUMEX    enoxaparin  80 MG/0.8ML injection Commonly known as: LOVENOX    milrinone  20 MG/100 ML Soln infusion Commonly known as: PRIMACOR    NON FORMULARY       TAKE these medications    albuterol  108 (90 Base) MCG/ACT inhaler Commonly known as: VENTOLIN  HFA Inhale 2 puffs into the lungs every 2 (two) hours as needed for wheezing or shortness of breath.   amiodarone  200 MG tablet Commonly known as: PACERONE  Take 1 tablet (200 mg total) by mouth daily. Start taking on: August 06, 2023   atorvastatin  20 MG tablet Commonly known as: LIPITOR TAKE ONE TABLET BY MOUTH ONCE DAILY.   benzonatate  100 MG capsule Commonly known as: TESSALON  Take 1 capsule (100 mg total) by mouth 3 (three) times daily as needed for cough. Do not take with alcohol  or while operating or driving heavy machinery   digoxin  0.125 MG tablet Commonly known as: LANOXIN  Take 1 tablet (0.125 mg total) by mouth daily.   Fe Fum-Vit C-Vit B12-FA Caps capsule Commonly known as: TRIGELS-F FORTE Take 1 capsule by mouth daily after breakfast. Start taking on: August 06, 2023   gabapentin  300 MG capsule Commonly known as: NEURONTIN  Take 1 capsule (300 mg total) by mouth 2 (two) times daily.   oxyCODONE  5 MG immediate release tablet Commonly known as: Oxy IR/ROXICODONE  Take 1 tablet (5 mg total) by mouth every 6 (six) hours as needed for up to 5 days for severe pain (pain score 7-10).   pantoprazole  40 MG tablet Commonly known as: PROTONIX  Take 1 tablet (40  mg total) by mouth daily. Start taking on: August 06, 2023   potassium chloride  SA 20 MEQ tablet Commonly known as: KLOR-CON  M Take 1 tablet (20 mEq total) by mouth daily. What changed:  how much to take when to take this   senna-docusate 8.6-50 MG tablet Commonly known as: Senokot-S Take 1 tablet by mouth daily. Start taking on: August 06, 2023   spironolactone  25 MG tablet Commonly known as: ALDACTONE  Take 1 tablet (25 mg total) by mouth daily. What changed: how much to take   Torsemide  40 MG Tabs Take 40 mg by mouth daily. Start taking on: August 06, 2023   warfarin 5 MG tablet Commonly known as: COUMADIN  Take as directed. If you are unsure how to take this medication, talk to your nurse or doctor. Original instructions: Take 7.5 mg (1.5  tablets) by mouth daily on Tuesdays, Thursdays, and Saturdays, then take 5 mg (1 tablet) by mouth on all other days. What changed:  medication strength additional instructions               Durable Medical Equipment  (From admission, onward)           Start     Ordered   08/05/23 1215  Heart failure home health orders  (Heart failure home health orders / Face to face)  Once       Comments: Heart Failure Follow-up Care:  Verify follow-up appointments per Patient Discharge Instructions. Confirm transportation arranged. Reconcile home medications with discharge medication list. Remove discontinued medications from use. Assist patient/caregiver to manage medications using pill box. Reinforce low sodium food selection Assessments: Vital signs and oxygen saturation at each visit. Assess home environment for safety concerns, caregiver support and availability of low-sodium foods. Consult Child psychotherapist, PT/OT, Dietitian, and CNA based on assessments. Perform comprehensive cardiopulmonary assessment. Notify MD for any change in condition or weight gain of 3 pounds in one day or 5 pounds in one week with symptoms. Daily Weights and  Symptom Monitoring: Ensure patient has access to scales. Teach patient/caregiver to weigh daily before breakfast and after voiding using same scale and record.    Teach patient/caregiver to track weight and symptoms and when to notify Provider. Activity: Develop individualized activity plan with patient/caregiver.   Question Answer Comment  Heart Failure Follow-up Care Advanced Heart Failure (AHF) Clinic at [phone]   Obtain the following labs Basic Metabolic Panel   Lab frequency Other see comments   Fax lab results to AHF Clinic at [phone]   Diet Low Sodium Heart Healthy   Fluid restrictions: 2000 mL Fluid   Skilled Nurse to notify MD of weight trends weekly for first 2 weeks. May fax or call: AHF Clinic at [phone] (fax) or [phone]      08/05/23 1216   08/01/23 1915  For home use only DME 4 wheeled rolling walker with seat  Once       Question Answer Comment  Patient needs a walker to treat with the following condition LVAD (left ventricular assist device) present [facility])   Patient needs a walker to treat with the following condition HF (heart failure) (HCC)   Patient needs a walker to treat with the following condition Physical deconditioning      08/01/23 1915   08/01/23 1915  For home use only DME 3 n 1  Once        08/01/23 1915            Disposition   The patient will be discharged in stable condition to home.   Follow-up Information     Grooms, Charmaine, [place]. Go in 5 day(s).   Specialty: Physician Assistant Why: Hospital follow up appointment scheduled for Tuesday, July 29, 2023 at 9:40 AM.  PLEASE ARRIVE 10-15 minutes early.  PLEASE CALL to cancel/reschedule if you CANNOT make appointment. Contact information: [address] [place] [address] [phone]         [facility] Follow up on 08/06/2023.   Specialty: Cardiology Why: VAD Clinic for dressing change at 11am Contact  information: [address] [place]  [phone]        [facility] Follow up on 08/12/2023.   Specialty: Cardiology Why: VAD Clinic at 11am Contact information: [address] [place]  [address] [phone]                 Duration of Discharge Encounter: 25 minutes  [name] [name], NP 08/05/2023, 12:37 PM

## 2023-08-01 NOTE — Progress Notes (Addendum)
 PHARMACY - ANTICOAGULATION CONSULT NOTE  Pharmacy Consult for warfarin Indication: LVAD  Allergies  Allergen Reactions   Sulfa  Antibiotics Itching, Swelling and Rash    Lip swelling    Patient Measurements: Height: 5' 10 (177.8 cm) Weight: 112.9 kg (248 lb 14.4 oz) IBW/kg (Calculated) : 68.5 HEPARIN  DW (KG): 94.1  Vital Signs: Temp: 97.7 F (36.5 C) (07/11 0700) Temp Source: Oral (07/11 0700) BP: 76/59 (07/11 0815)  Labs: Recent Labs    07/30/23 0454 07/30/23 0456 07/30/23 0456 07/31/23 0519 07/31/23 1000 08/01/23 0544  HGB  --  7.7*   < > 8.0*  --  8.5*  HCT  --  25.1*  --  25.7*  --  27.6*  PLT  --  254  --  291  --  361  LABPROT  --  21.9*  --  22.8*  --  26.1*  INR  --  1.8*  --  1.9*  --  2.3*  CREATININE 0.74  --   --   --  0.84 0.86   < > = values in this interval not displayed.    Estimated Creatinine Clearance: 111.4 mL/min (by C-G formula based on SCr of 0.86 mg/dL).   Medical History: Past Medical History:  Diagnosis Date   Anemia    Blood transfusion without reported diagnosis    CAD (coronary artery disease)    Nonobstructive   Closed left ankle fracture    August 30 2012   Clotting disorder [facility])    DVT (deep venous thrombosis) (HCC)    L leg   Fibroid tumor    HFrEF (heart failure with reduced ejection fraction) (HCC)    Hidradenitis    Hypertension    Mitral regurgitation    Myocardial infarction (HCC)    NICM (nonischemic cardiomyopathy) (HCC)    NSVT (nonsustained ventricular tachycardia) (HCC)    Obesity    OSA on CPAP    Pulmonary embolism (HCC) 04/2013   Sleep apnea     Medications:  Scheduled:   acetaminophen   500 mg Oral TID   amiodarone   200 mg Oral BID   Chlorhexidine  Gluconate Cloth  6 each Topical Daily   Fe Fum-Vit C-Vit B12-FA  1 capsule Oral QPC breakfast   gabapentin   300 mg Oral BID   pantoprazole   40 mg Oral Daily   senna-docusate  1 tablet Oral Daily   sodium chloride  flush  10-40 mL Intracatheter Q12H    spironolactone   25 mg Oral Daily   torsemide   40 mg Oral Daily   Warfarin - Physician Dosing Inpatient   Does not apply q1600   Infusions:   milrinone  0.125 mcg/kg/min (08/01/23 0800)    Assessment: 48 yo F who had an LVAD placed on 7/3. PMH significant for HFrEF, severe MR, DVT/PE, pancytopenia. Home warfarin regimen: 10 mg M-F, 5 mg on Sa/Su. Pharmacy has been consulted for warfarin management.  Hgb 8.5 and plts 264 are stable. Patient has been on lower doses of warfarin since LVAD implantation. Received 7.5mg  for the last 3 doses. Eating well and drinking Ensures. On amiodarone  since 7/4 (POD1). LDH slowly decreasing, now at 264.  INR today is 2.3, up from 1.9 on 7/10. Now therapeutic, decent increase in INR from yesterday. Will give a slightly lower dose tonight to avoid supratherapeutic INR.   Goal of Therapy:  INR 2.0-2.5 Monitor platelets by anticoagulation protocol: Yes   Plan:  Warfarin 5 mg x1 tonight Monitor daily INR, CBC, signs/symptoms of bleeding   [facility]  Emmette, PharmD PGY2 Cardiology Pharmacy Resident  Please check AMION for all [facility] Pharmacy phone numbers After 10:00 PM, call Main Pharmacy [phone] 08/01/2023,10:14 AM

## 2023-08-01 NOTE — Progress Notes (Addendum)
 Patient ID: [name] [name], female   DOB: 1975-08-23, 48 y.o.   MRN: [id number]     Advanced Heart Failure Rounding Note  Cardiologist: Ria Commander, DO  Chief Complaint: Cardiogenic Shock Subjective:    6/17: mTEER with sev posterior leaflet restriction resulting in tear. IABP + PAC. PCWP 40, v up to 60 6/24 : Aggressive diuresed. Started on lasix  drip. Brisk diuresis noted.  6/30: Echo with EF 25-30%, RV moderately dysfunctional/mildly dilated, Mitraclip present with severe MR, mild-moderate TR, dilated IVC.  7/3: s/p HM III LVAD and bioprosthetic MVR 7/4: Extubated 7/7: LVAD speed increased to 5800 7/8: LVAD speed increased to 5900 7/9: Weaned milrinone  to 0.125. Ramp echo >> speed increased to 6000   CO-OX 55% on 0.125 milrinone . CVP 9.  Weight up 2 lb.   No concerns this am. Denies dyspnea. Had a bowel movement. Has already ambulated the halls.   INR 2.3.   LVAD Interrogation HM III: Speed: 6000 Flow: 4.8 PI: 3.7 Power: 4.8.  No PI events or alarms.  Objective:    Weight Range: 112.9 kg Body mass index is 35.71 kg/m.   Vital Signs:   Temp:  [97.2 F (36.2 C)-98.9 F (37.2 C)] 97.7 F (36.5 C) (07/11 0700) Pulse Rate:  [82-138] 92 (07/10 2131) Resp:  [12-36] 23 (07/11 0645) BP: (82-115)/(62-83) 107/83 (07/11 0400) SpO2:  [63 %-100 %] 99 % (07/11 0400) FiO2 (%):  [36 %] 36 % (07/10 2245) Weight:  [112.9 kg] 112.9 kg (07/11 0500) Last BM Date : 07/28/23  Weight change: Filed Weights   07/30/23 0906 07/31/23 0700 08/01/23 0500  Weight: 112.4 kg 112 kg 112.9 kg   Intake/Output:  Intake/Output Summary (Last 24 hours) at 08/01/2023 0845 Last data filed at 08/01/2023 0600 Gross per 24 hour  Intake 377.64 ml  Output 2100 ml  Net -1722.36 ml   Physical Exam   Vitals:   08/01/23 0645 08/01/23 0700  BP:    Pulse:    Resp: (!) 23   Temp:  97.7 F (36.5 C)  SpO2:     Physical Exam: GENERAL: Sitting up in chair. No distress NECK: JVP 8-10 CARDIAC:   Mechanical heart sounds with LVAD hum present.  LUNGS:  Clear to auscultation bilaterally.  ABDOMEN:  Soft, round, obese LVAD exit site:   Dressing dry and intact.  Stabilization device present and accurately applied.   EXTREMITIES:  1-2+ edema, + TED hose NEUROLOGIC:  Alert and oriented x 4.   Affect pleasant.       Telemetry   SR 80s  Labs  CBC Recent Labs    07/31/23 0519 08/01/23 0544  WBC 9.1 8.7  NEUTROABS 7.2 6.8  HGB 8.0* 8.5*  HCT 25.7* 27.6*  MCV 89.9 90.2  PLT 291 361   Basic Metabolic Panel Recent Labs    92/89/74 0519 07/31/23 1000 08/01/23 0544  NA  --  131* 134*  K  --  3.7 4.0  CL  --  90* 93*  CO2  --  31 32  GLUCOSE  --  102* 97  BUN  --  15 13  CREATININE  --  0.84 0.86  CALCIUM   --  8.9 8.7*  MG 1.9  --  1.8  PHOS 2.7  --  3.3   Liver Function Tests No results for input(s): AST, ALT, ALKPHOS, BILITOT, PROT, ALBUMIN  in the last 72 hours.  BNP (last 3 results) Recent Labs    07/04/23 1057 07/25/23 0500 07/31/23 0519  BNP  612.0* 938.1* 856.6*   Fasting Lipid Panel No results for input(s): CHOL, HDL, LDLCALC, TRIG, CHOLHDL, LDLDIRECT in the last 72 hours.  Medications:   Scheduled Medications:  amiodarone   200 mg Oral BID   aspirin  EC  81 mg Oral Daily   Chlorhexidine  Gluconate Cloth  6 each Topical Daily   docusate sodium   200 mg Oral Daily   Fe Fum-Vit C-Vit B12-FA  1 capsule Oral QPC breakfast   gabapentin   300 mg Oral BID   pantoprazole   40 mg Oral Daily   polyethylene glycol  17 g Oral Daily   sodium chloride  flush  10-40 mL Intracatheter Q12H   spironolactone   25 mg Oral Daily   torsemide   40 mg Oral Daily   Warfarin - Physician Dosing Inpatient   Does not apply q1600   Infusions:  magnesium  sulfate bolus IVPB 4 g (08/01/23 0810)   milrinone  0.125 mcg/kg/min (08/01/23 0600)   PRN Medications: ondansetron  (ZOFRAN ) IV, mouth rinse, oxyCODONE , phenol, sodium chloride  flush, traMADol   Patient  Profile   48 y.o. female with history of chronic systolic HF with biventricular dysfunction on home inotrope, severe MR, hx PE/DVT, obesity, CAD, anemia, tobacco use. Presented for mTEER on 07/09/23. Worsening MR leading to cardiogenic shock. S/p LVAD 07/24/23.   Assessment/Plan  Cardiogenic shock>>>now S/P HM III LVAD 07/24/23: due to torrential MR s/p mTEER complicated by severely restricted posterior leaflet that was damaged during repair attempt, IABP and nipride  needed to stabilize. Transitioned to Impella 5.5. Not a transplant candidate with tobacco use.  Nonobstructive CAD on cath 2023.  - 7/3 HM 3 + bioprosthetic MV replacement.  Noted to have acute/subacute pericarditis with inflammatory exudate over heart and pericardium  - Ramp echo 07/09 > speed increased to 6000 - LVAD parameters stable - CO-OX 55% on 0.125 milrinone  with Fick CI of 2.4 and Hgb 8.5 - Give one more dose of 80 mg lasix  IV today and stop milrinone . CO-OX this afternoon. - Continue Torsemide  40 mg daily (may end up requiring BID dosing) - Continue spiro 25 mg daily - Likely ready for discharge on 7/14 from VAD teaching standpoint  HM 3 LVAD - MAP predominately 70s-80s - LDH stable - Continue warfarin, INR 2.3. PharmD now dosing Warfarin - Now off aspirin  - Doing well with discharge teaching - Adjusting pain regimen to try to cut back PRN narcotic. Discussed with PharmD.   Torrential MR - S/p bioprosthetic MVR.    Acute hypoxic respiratory failure Resolved  Hx of PE/DVT w/ IVC filter - Evaluated at [facility] where thrombophilia work-up was negative (Lupus inhibitor, anticardiolipin antibodies, and anti beta-2  glycoprotein antibodies were negative; protein C activity 85%, protein S activity 76%, factor V Leiden, and prothrombin gene mutation ) - IVC filter in place; could not be removed due to significant thrombus burden on filter at [facility]  - Continue Warfarin. INR 2.3   CAD - LHC in 5/23 w/ nonobstructive CAD involving  RCA  Tobacco use - Smoking 3 cigarettes/day at time of last follow-up.  Anemia  - long h/o IDA, required frequent IV infusions pre-op  - Hgb 8.5 today - Given IV iron  07/07  PVCs - Low burden - Amiodarone  200 mg bid currently.   AKI - Resolved  Has transfer orders for 2C.    Length of Stay: 23  FINCH, LINDSAY N, PA-C  08/01/2023, 8:45 AM  Advanced Heart Failure Team Pager [phone] (M-F; 7a - 5p)  Please contact [facility] for night-coverage after hours (5p -7a )  and weekends on [email or web address]    Patient seen and examined with the above-signed Advanced Practice Provider and/or Housestaff. I personally reviewed laboratory data, imaging studies and relevant notes. I independently examined the patient and formulated the important aspects of the plan. I have edited the note to reflect any of my changes or salient points. I have personally discussed the plan with the patient and/or family.  Milrinone  stopped and co-ox dropped. Milrinone  restarted. Received 80mg  IV lasix  this am with decen output. INR 2.3  Walking hall. Denies SOB    General:  NAD.  HEENT: normal  Neck: supple. JVP 10  Carotids 2+ bilat; no bruits. No lymphadenopathy or thryomegaly appreciated. Cor: LVAD hum.  Lungs: Clear. Abdomen: obese soft, nontender, non-distended. No hepatosplenomegaly. No bruits or masses. Good bowel sounds. Driveline site clean. Anchor in place.  Extremities: no cyanosis, clubbing, rash. Warm 1+ edema  Neuro: alert & oriented x 3. No focal deficits. Moves all 4 without problem   She did not tolerate milrinone  wean, Restarted at 0.125   Volume status looks good, Would be careful not to diurese.   INR 2.3  Discussed warfarin dosing with PharmD personally.  VAD interrogated personally. Parameters stable.PI > 3 will keep spped at 6000.  Continue to ambulate.  [facility] go to [facility]  Toribio Fuel, MD  10:00 PM

## 2023-08-01 NOTE — Progress Notes (Signed)
   08/01/23 2245  BiPAP/CPAP/SIPAP  BiPAP/CPAP/SIPAP Pt Type Adult  BiPAP/CPAP/SIPAP Resmed  Mask Type Full face mask  Dentures removed? Not applicable  Mask Size Medium  Respiratory Rate 16 breaths/min  EPAP 10 cmH2O  Pressure Support 5 cmH20  PEEP 10 cmH20  FiO2 (%) 21 %  Patient Home Machine No  Patient Home Mask No  Patient Home Tubing No  Auto Titrate No  Press High Alarm 40 cmH2O  CPAP/SIPAP surface wiped down Yes  Device Plugged into RED Power Outlet Yes  BiPAP/CPAP /SiPAP Vitals  Resp 16  Bilateral Breath Sounds Clear  MEWS Score/Color  MEWS Score 3  MEWS Score Color Yellow

## 2023-08-01 NOTE — Progress Notes (Addendum)
 CO-OX 46% this afternoon off milrinone .  Will restart milrinone  at 0.125 mcg/kg/min. Repeat CO-OX in the morning.

## 2023-08-01 NOTE — Progress Notes (Signed)
 LVAD Coordinator Rounding Note:  Admitted 07/09/23 post mitral clip with severe residual mitral regurgitation and cardiogenic shock. Impella 5.5 placed 07/10/23 and VAD evaluation initiated.  HM III LVAD implanted on 07/24/23 by Dr. Lucas under destination criteria.  6/17: mTEER with sev posterior leaflet restriction resulting in tear. IABP + PAC. PCWP 40, v up to 60 6/24 : Aggressive diuresed. Started on lasix  drip. Brisk diuresis noted.  6/30: Echo with EF 25-30%, RV moderately dysfunctional/mildly dilated, Mitraclip present with severe MR, mild-moderate TR, dilated IVC.  7/3: s/p HM III LVAD and bioprosthetic MVR 7/4: Extubated 7/7: Speed increased to 5800 7/8: Speed increased to 5900 7/9: Decrease Milrinone  to 0.125 mcg. Speed increased to 6000.   Pt sitting up in the chair on my arrival. States she is feeling pretty good today. She has walked multiple laps in the hallway. Has transfer orders to [facility].   Ramp echo completed. Speed increased to 6000 per Dr Bensimhon yesterday. See separate note for documentation. Milrinone  currently at 0.125 mcg.   D/c teaching completed today. Please see separate note for details. Ongoing dressing change teaching with pts cousin. Palmer Fahrner, bedside RN will plan to observe pts cousin [place] over the weekend.  Home VAD equipment arrived.   Vital signs: Temp: 97.8 HR: 85 Doppler Pressure: 74 Automatic BP: 82/71 (77) O2 Sat: 94% on RA Wt: 250.8>246.9>248.4>247.8>248.9 lbs  LVAD interrogation reveals:  Speed: 6000 Flow: 4.7 Power:  4.8 w PI: 4.8  Alarms: none Events:  none Hematocrit: 27  Fixed speed: 6000 Low speed limit: 5700  Drive Line: Existing VAD dressing removed and site care performed using sterile technique. Drive line exit site cleaned with Chlora prep applicators x 2, allowed to dry, and gauze dressing with silver strip applied flush to exit site. Exit site healing and unincorporated, the velour is fully implanted at exit site. Suture  intact. Moderate serousangenous drainage. No redness, tenderness, foul odor or rash noted. Drive line anchor secured. Continue daily dressing changes. Next dressing change 08/02/23 by VAD Coordinator or nurse champion.        Labs:  LDH trend: 476>390>338>301>264  INR trend: 1.4>1.8>1.7>1.8>2.3  Anticoagulation Plan: -INR Goal: 2.0-2.5 -ASA Dose: n/a  Blood Products:  Intra-op: 2 FFP, 1 Platelet, 2 Cryo Post-op:  07/24/23: 1 PRBC 07/25/23:1 PRBC  Device:  Arrythmias:  HX: NSVT on p.o. amiodarone   Respiratory: extubated 07/25/23  Infection:  Renal:  -BUN/CRT: 24/1.15>1.02>24/0.87>23/0.74  Drips:  Milrinone  0.125 mcg/kg/min  Adverse Events on VAD:  VAD Education: D/c teaching completed 08/01/23 - see separate note for details.  Ongoing driveline dressing teaching  Plan/Recommendations:  1. Page VAD coordinator with any VAD alarms or equipment issues. 2. Drive line dressing to be changed daily by VAD Coordinator or Oncologist.   Lauraine Ip RN VAD Coordinator  Office: [phone]  24/7 Pager: [phone]

## 2023-08-02 DIAGNOSIS — D649 Anemia, unspecified: Secondary | ICD-10-CM | POA: Diagnosis not present

## 2023-08-02 DIAGNOSIS — R570 Cardiogenic shock: Secondary | ICD-10-CM | POA: Diagnosis not present

## 2023-08-02 LAB — CBC WITH DIFFERENTIAL/PLATELET
Abs Immature Granulocytes: 0.04 K/uL (ref 0.00–0.07)
Basophils Absolute: 0 K/uL (ref 0.0–0.1)
Basophils Relative: 0 %
Eosinophils Absolute: 0.2 K/uL (ref 0.0–0.5)
Eosinophils Relative: 3 %
HCT: 27.9 % — ABNORMAL LOW (ref 36.0–46.0)
Hemoglobin: 8.5 g/dL — ABNORMAL LOW (ref 12.0–15.0)
Immature Granulocytes: 0 %
Lymphocytes Relative: 9 %
Lymphs Abs: 0.8 K/uL (ref 0.7–4.0)
MCH: 27.2 pg (ref 26.0–34.0)
MCHC: 30.5 g/dL (ref 30.0–36.0)
MCV: 89.1 fL (ref 80.0–100.0)
Monocytes Absolute: 0.8 K/uL (ref 0.1–1.0)
Monocytes Relative: 8 %
Neutro Abs: 7.5 K/uL (ref 1.7–7.7)
Neutrophils Relative %: 80 %
Platelets: 364 K/uL (ref 150–400)
RBC: 3.13 MIL/uL — ABNORMAL LOW (ref 3.87–5.11)
RDW: 20.9 % — ABNORMAL HIGH (ref 11.5–15.5)
WBC: 9.4 K/uL (ref 4.0–10.5)
nRBC: 0 % (ref 0.0–0.2)

## 2023-08-02 LAB — COOXEMETRY PANEL
Carboxyhemoglobin: 1.9 % — ABNORMAL HIGH (ref 0.5–1.5)
Methemoglobin: 1 % (ref 0.0–1.5)
O2 Saturation: 52.3 %
Total hemoglobin: 8.8 g/dL — ABNORMAL LOW (ref 12.0–16.0)

## 2023-08-02 LAB — GLUCOSE, CAPILLARY
Glucose-Capillary: 76 mg/dL (ref 70–99)
Glucose-Capillary: 107 mg/dL — ABNORMAL HIGH (ref 70–99)

## 2023-08-02 LAB — BASIC METABOLIC PANEL WITH GFR
Anion gap: 10 (ref 5–15)
BUN: 18 mg/dL (ref 6–20)
CO2: 31 mmol/L (ref 22–32)
Calcium: 8.7 mg/dL — ABNORMAL LOW (ref 8.9–10.3)
Chloride: 90 mmol/L — ABNORMAL LOW (ref 98–111)
Creatinine, Ser: 1 mg/dL (ref 0.44–1.00)
GFR, Estimated: >60 mL/min (ref >60)
Glucose, Bld: 92 mg/dL (ref 70–99)
Potassium: 3.6 mmol/L (ref 3.5–5.1)
Sodium: 131 mmol/L — ABNORMAL LOW (ref 135–145)

## 2023-08-02 LAB — PROTIME-INR
INR: 2.3 — ABNORMAL HIGH (ref 0.8–1.2)
Prothrombin Time: 26.3 s — ABNORMAL HIGH (ref 11.4–15.2)

## 2023-08-02 LAB — MAGNESIUM: Magnesium: 1.9 mg/dL (ref 1.7–2.4)

## 2023-08-02 LAB — PHOSPHORUS: Phosphorus: 3.6 mg/dL (ref 2.5–4.6)

## 2023-08-02 LAB — LACTATE DEHYDROGENASE: LDH: 252 U/L — ABNORMAL HIGH (ref 98–192)

## 2023-08-02 MED ORDER — WARFARIN SODIUM 5 MG PO TABS
5.0000 mg | ORAL_TABLET | Freq: Once | ORAL | Status: AC
  Administered 2023-08-02: 5 mg via ORAL
  Filled 2023-08-02: qty 1

## 2023-08-02 MED ORDER — POTASSIUM CHLORIDE 20 MEQ PO PACK
40.0000 meq | PACK | Freq: Once | ORAL | Status: AC
  Administered 2023-08-02: 40 meq via ORAL
  Filled 2023-08-02: qty 2

## 2023-08-02 MED ORDER — DIGOXIN 125 MCG PO TABS
0.1250 mg | ORAL_TABLET | Freq: Every day | ORAL | Status: DC
  Administered 2023-08-02 – 2023-08-05 (×4): 0.125 mg via ORAL
  Filled 2023-08-02 (×4): qty 1

## 2023-08-02 MED ORDER — MAGNESIUM SULFATE 4 GM/100ML IV SOLN
4.0000 g | Freq: Once | INTRAVENOUS | Status: AC
  Administered 2023-08-02: 4 g via INTRAVENOUS
  Filled 2023-08-02: qty 100

## 2023-08-02 MED ORDER — MELATONIN 3 MG PO TABS
3.0000 mg | ORAL_TABLET | Freq: Every day | ORAL | Status: DC
  Administered 2023-08-02 – 2023-08-04 (×3): 3 mg via ORAL
  Filled 2023-08-02 (×3): qty 1

## 2023-08-02 NOTE — Progress Notes (Signed)
 PHARMACY - ANTICOAGULATION CONSULT NOTE  Pharmacy Consult for warfarin Indication: LVAD  Allergies  Allergen Reactions   Sulfa  Antibiotics Itching, Swelling and Rash    Lip swelling    Patient Measurements: Height: 5' 10 (177.8 cm) Weight: 111.5 kg (245 lb 13 oz) IBW/kg (Calculated) : 68.5 HEPARIN  DW (KG): 94.1  Vital Signs: Temp: 98 F (36.7 C) (07/12 0700) Temp Source: Oral (07/12 0700)  Labs: Recent Labs    07/31/23 0519 07/31/23 1000 08/01/23 0544 08/02/23 0525  HGB 8.0*  --  8.5* 8.5*  HCT 25.7*  --  27.6* 27.9*  PLT 291  --  361 364  LABPROT 22.8*  --  26.1* 26.3*  INR 1.9*  --  2.3* 2.3*  CREATININE  --  0.84 0.86 1.00    Estimated Creatinine Clearance: 95.1 mL/min (by C-G formula based on SCr of 1 mg/dL).   Medical History: Past Medical History:  Diagnosis Date   Anemia    Blood transfusion without reported diagnosis    CAD (coronary artery disease)    Nonobstructive   Closed left ankle fracture    August 30 2012   Clotting disorder (HCC)    DVT (deep venous thrombosis) (HCC)    L leg   Fibroid tumor    HFrEF (heart failure with reduced ejection fraction) (HCC)    Hidradenitis    Hypertension    Mitral regurgitation    Myocardial infarction (HCC)    NICM (nonischemic cardiomyopathy) (HCC)    NSVT (nonsustained ventricular tachycardia) (HCC)    Obesity    OSA on CPAP    Pulmonary embolism (HCC) 04/2013   Sleep apnea     Medications:  Scheduled:   acetaminophen   500 mg Oral TID   amiodarone   200 mg Oral BID   Chlorhexidine  Gluconate Cloth  6 each Topical Daily   Fe Fum-Vit C-Vit B12-FA  1 capsule Oral QPC breakfast   gabapentin   300 mg Oral BID   pantoprazole   40 mg Oral Daily   potassium chloride   40 mEq Oral Once   senna-docusate  1 tablet Oral Daily   sodium chloride  flush  10-40 mL Intracatheter Q12H   spironolactone   25 mg Oral Daily   torsemide   40 mg Oral Daily   Warfarin - Pharmacist Dosing Inpatient   Does not apply q1600    Infusions:   milrinone  0.125 mcg/kg/min (08/02/23 0800)    Assessment: 48 yo F who had an LVAD placed on 7/3. PMH significant for HFrEF, severe MR, DVT/PE, pancytopenia. Home warfarin regimen: 10 mg M-F, 5 mg on Sa/Su. Pharmacy has been consulted for warfarin management.  Hgb 8.5 and plts 364 are stable. Patient has been on lower doses of warfarin since LVAD implantation. Eating well. On amiodarone  since 7/4 (POD1), started digoxin  today. LDH slowly decreasing, now at 252.  INR today is 2.3, which is therapeutic and stable from yesterday. Will continue with 5mg  dose today to maintain INR.   Goal of Therapy:  INR 2.0-2.5 Monitor platelets by anticoagulation protocol: Yes   Plan:  Warfarin 5 mg x1 tonight Monitor daily INR, CBC, signs/symptoms of bleeding   Nidia Schaffer, PharmD PGY2 Cardiology Pharmacy Resident  Please check AMION for all [facility] Pharmacy phone numbers After 10:00 PM, call Main Pharmacy [phone] 08/02/2023,9:16 AM

## 2023-08-02 NOTE — Progress Notes (Signed)
 Patient's cousin (primary VAD/driveline caretaker at pt home) did not come in today. Due to this, RN unable to educate patient's cousin or instruct during VAD driveline dressing change. Driveline dressing chage performed by this RN as follows:    Drive line dressing change:   LVAD driveline dressing was changed using proper sterile technique. Old dressing removed. Driveline site cleaned using chloroprep x2. Silver strip applied. New gauze dressing applied using sterile technique. Driveline secured at driveline site with 1 suture. Driveline site clean, moderate serous drainage from site, no redness, no swelling, no tenderness, no odor from drainage. Driveline anchor intact and applied correctly.   NEXT DRESSING CHANGE DUE 08/03/2023.   Lauraine FABIENE Ahle RN

## 2023-08-02 NOTE — Plan of Care (Signed)
  Problem: Education: Goal: Knowledge of General Education information will improve Description: Including pain rating scale, medication(s)/side effects and non-pharmacologic comfort measures Outcome: Progressing   Problem: Health Behavior/Discharge Planning: Goal: Ability to manage health-related needs will improve Outcome: Progressing   Problem: Clinical Measurements: Goal: Ability to maintain clinical measurements within normal limits will improve Outcome: Progressing Goal: Will remain free from infection Outcome: Progressing Goal: Diagnostic test results will improve Outcome: Progressing Goal: Respiratory complications will improve Outcome: Progressing Goal: Cardiovascular complication will be avoided Outcome: Progressing   Problem: Activity: Goal: Risk for activity intolerance will decrease Outcome: Progressing   Problem: Nutrition: Goal: Adequate nutrition will be maintained Outcome: Progressing   Problem: Coping: Goal: Level of anxiety will decrease Outcome: Progressing   Problem: Elimination: Goal: Will not experience complications related to bowel motility Outcome: Progressing Goal: Will not experience complications related to urinary retention Outcome: Progressing   Problem: Pain Managment: Goal: General experience of comfort will improve and/or be controlled Outcome: Progressing   Problem: Safety: Goal: Ability to remain free from injury will improve Outcome: Progressing   Problem: Skin Integrity: Goal: Risk for impaired skin integrity will decrease Outcome: Progressing   Problem: Activity: Goal: Ability to tolerate increased activity will improve Outcome: Progressing   Problem: Respiratory: Goal: Ability to maintain a clear airway and adequate ventilation will improve Outcome: Progressing   Problem: Role Relationship: Goal: Method of communication will improve Outcome: Progressing   Problem: Cardiac: Goal: Ability to achieve and maintain  adequate cardiopulmonary perfusion will improve Outcome: Progressing Goal: Vascular access site(s) Level 0-1 will be maintained Outcome: Progressing   Problem: Fluid Volume: Goal: Ability to achieve a balanced intake and output will improve Outcome: Progressing   Problem: Physical Regulation: Goal: Complications related to the disease process, condition or treatment will be avoided or minimized Outcome: Progressing   Problem: Respiratory: Goal: Will regain and/or maintain adequate ventilation Outcome: Progressing   Problem: Education: Goal: Ability to describe self-care measures that may prevent or decrease complications (Diabetes Survival Skills Education) will improve Outcome: Progressing Goal: Individualized Educational Video(s) Outcome: Progressing   Problem: Coping: Goal: Ability to adjust to condition or change in health will improve Outcome: Progressing   Problem: Fluid Volume: Goal: Ability to maintain a balanced intake and output will improve Outcome: Progressing   Problem: Health Behavior/Discharge Planning: Goal: Ability to identify and utilize available resources and services will improve Outcome: Progressing Goal: Ability to manage health-related needs will improve Outcome: Progressing   Problem: Metabolic: Goal: Ability to maintain appropriate glucose levels will improve Outcome: Progressing   Problem: Nutritional: Goal: Maintenance of adequate nutrition will improve Outcome: Progressing Goal: Progress toward achieving an optimal weight will improve Outcome: Progressing   Problem: Skin Integrity: Goal: Risk for impaired skin integrity will decrease Outcome: Progressing   Problem: Tissue Perfusion: Goal: Adequacy of tissue perfusion will improve Outcome: Progressing

## 2023-08-02 NOTE — Progress Notes (Signed)
 Patient ID: [name] [name], female   DOB: [birth year], 48 y.o.   MRN: [id number]     Advanced Heart Failure Rounding Note  Cardiologist: Ria Commander, DO  Chief Complaint: Cardiogenic Shock Subjective:    6/17: mTEER with sev posterior leaflet restriction resulting in tear. IABP + PAC. PCWP 40, v up to 60 6/24 : Aggressive diuresed. Started on lasix  drip. Brisk diuresis noted.  6/30: Echo with EF 25-30%, RV moderately dysfunctional/mildly dilated, Mitraclip present with severe MR, mild-moderate TR, dilated IVC.  7/3: s/p HM III LVAD and bioprosthetic MVR 7/4: Extubated 7/7: LVAD speed increased to 5800 7/8: LVAD speed increased to 5900 7/9: Weaned milrinone  to 0.125. Ramp echo >> speed increased to 6000   Milrinone  stopped yesterday Co-ox dropped to 46%. Milrinone  restarted at 0.125. Co-ox 52%  CVP 10-11  INR 2.3  LVAD Interrogation HM III: Speed: 6000 Flow: 4.6 PI: 3.3 Power: 4.6. VAD interrogated personally. Parameters stable.  Objective:    Weight Range: 111.5 kg Body mass index is 35.27 kg/m.   Vital Signs:   Temp:  [98 F (36.7 C)-98.7 F (37.1 C)] 98 F (36.7 C) (07/12 0700) Resp:  [16-31] 19 (07/12 1100) SpO2:  [92 %-95 %] 94 % (07/12 0500) FiO2 (%):  [21 %] 21 % (07/11 2245) Weight:  [111.5 kg] 111.5 kg (07/12 0600) Last BM Date : 08/01/23  Weight change: Filed Weights   07/31/23 0700 08/01/23 0500 08/02/23 0600  Weight: 112 kg 112.9 kg 111.5 kg   Intake/Output:  Intake/Output Summary (Last 24 hours) at 08/02/2023 1248 Last data filed at 08/02/2023 1001 Gross per 24 hour  Intake 749.3 ml  Output 1550 ml  Net -800.7 ml   Physical Exam   Vitals:   08/02/23 1000 08/02/23 1100  BP:    Pulse:    Resp: (!) 26 19  Temp:    SpO2:     Physical Exam: General:  NAD.  HEENT: normal  Neck: supple. JVP 10-11.  Carotids 2+ bilat; no bruits. No lymphadenopathy or thryomegaly appreciated. Cor: LVAD hum.  Lungs: Clear. Abdomen: obese soft, nontender,  non-distended. No hepatosplenomegaly. No bruits or masses. Good bowel sounds. Driveline site clean. Anchor in place.  Extremities: no cyanosis, clubbing, rash. Warm tr edema  Neuro: alert & oriented x 3. No focal deficits. Moves all 4 without problem   Telemetry   SR 80-90s Personally reviewed  Labs  CBC Recent Labs    08/01/23 0544 08/02/23 0525  WBC 8.7 9.4  NEUTROABS 6.8 7.5  HGB 8.5* 8.5*  HCT 27.6* 27.9*  MCV 90.2 89.1  PLT 361 364   Basic Metabolic Panel Recent Labs    92/88/74 0544 08/02/23 0525  NA 134* 131*  K 4.0 3.6  CL 93* 90*  CO2 32 31  GLUCOSE 97 92  BUN 13 18  CREATININE 0.86 1.00  CALCIUM  8.7* 8.7*  MG 1.8 1.9  PHOS 3.3 3.6   Liver Function Tests No results for input(s): AST, ALT, ALKPHOS, BILITOT, PROT, ALBUMIN  in the last 72 hours.  BNP (last 3 results) Recent Labs    07/04/23 1057 07/25/23 0500 07/31/23 0519  BNP 612.0* 938.1* 856.6*   Fasting Lipid Panel No results for input(s): CHOL, HDL, LDLCALC, TRIG, CHOLHDL, LDLDIRECT in the last 72 hours.  Medications:   Scheduled Medications:  acetaminophen   500 mg Oral TID   amiodarone   200 mg Oral BID   Chlorhexidine  Gluconate Cloth  6 each Topical Daily   digoxin   0.125 mg Oral  Daily   Fe Fum-Vit C-Vit B12-FA  1 capsule Oral QPC breakfast   gabapentin   300 mg Oral BID   pantoprazole   40 mg Oral Daily   senna-docusate  1 tablet Oral Daily   sodium chloride  flush  10-40 mL Intracatheter Q12H   spironolactone   25 mg Oral Daily   torsemide   40 mg Oral Daily   Warfarin - Pharmacist Dosing Inpatient   Does not apply q1600   Infusions:  magnesium  sulfate bolus IVPB 4 g (08/02/23 1131)   milrinone  0.125 mcg/kg/min (08/02/23 1000)   PRN Medications: ondansetron  (ZOFRAN ) IV, mouth rinse, oxyCODONE , phenol, sodium chloride  flush, traMADol   Patient Profile   48 y.o. female with history of chronic systolic HF with biventricular dysfunction on home inotrope, severe MR,  hx PE/DVT, obesity, CAD, anemia, tobacco use. Presented for mTEER on 07/09/23. Worsening MR leading to cardiogenic shock. S/p LVAD 07/24/23.   Assessment/Plan   Cardiogenic shock>>>now S/P HM III LVAD 07/24/23: due to torrential MR s/p mTEER complicated by severely restricted posterior leaflet that was damaged during repair attempt, IABP and nipride  needed to stabilize. Transitioned to Impella 5.5. Not a transplant candidate with tobacco use.  Nonobstructive CAD on cath 2023.  - 7/3 HM 3 + bioprosthetic MV replacement.  Noted to have acute/subacute pericarditis with inflammatory exudate over heart and pericardium  - Ramp echo 07/09 > speed increased to 6000 - VAD interrogated personally. Parameters stable. - Milrinone  stopped yesterday Co-ox dropped to 46%. Milrinone  restarted at 0.125. Co-ox 52% - Will start digoxin . Wean milrinone  slowly -  Volume status ok. Continue torsemide  40 daily  - Continue spiro 25 mg daily  HM 3 LVAD - MAps ok - VAD interrogated personally. Parameters stable. - LDH stable - INR 2.3 Discussed warfarin dosing with PharmD personally. - Now off aspirin  - Doing well with discharge teaching   Torrential MR - S/p bioprosthetic MVR.    Acute hypoxic respiratory failure Resolved  Hx of PE/DVT w/ IVC filter - Evaluated at [facility] where thrombophilia work-up was negative (Lupus inhibitor, anticardiolipin antibodies, and anti beta-2  glycoprotein antibodies were negative; protein C activity 85%, protein S activity 76%, factor V Leiden, and prothrombin gene mutation ) - IVC filter in place; could not be removed due to significant thrombus burden on filter at [facility]  - Continue Warfarin.INR 2.3   CAD - LHC in 5/23 w/ nonobstructive CAD involving RCA - no s/s angina  Tobacco use - Smoking 3 cigarettes/day at time of last follow-up.  Anemia  - long h/o IDA, required frequent IV infusions pre-op  - hgb stable at 8.5 - Given IV iron  07/07  PVCs - Low burden - Amiodarone   200 mg bid currently.   AKI - Resolved  Has transfer orders for 2C.   Length of Stay: 24  Toribio Fuel, MD  08/02/2023, 12:48 PM  Advanced Heart Failure Team Pager [phone] (M-F; 7a - 5p)  Please contact [facility] for night-coverage after hours (5p -7a ) and weekends on [email or web address]

## 2023-08-02 NOTE — Progress Notes (Signed)
 Patient requesting something for sleep.  Cardiology paged.

## 2023-08-03 DIAGNOSIS — D649 Anemia, unspecified: Secondary | ICD-10-CM | POA: Diagnosis not present

## 2023-08-03 DIAGNOSIS — R570 Cardiogenic shock: Secondary | ICD-10-CM | POA: Diagnosis not present

## 2023-08-03 LAB — CBC WITH DIFFERENTIAL/PLATELET
Abs Immature Granulocytes: 0.05 K/uL (ref 0.00–0.07)
Basophils Absolute: 0.1 K/uL (ref 0.0–0.1)
Basophils Relative: 1 %
Eosinophils Absolute: 0.2 K/uL (ref 0.0–0.5)
Eosinophils Relative: 1 %
HCT: 27.3 % — ABNORMAL LOW (ref 36.0–46.0)
Hemoglobin: 8.3 g/dL — ABNORMAL LOW (ref 12.0–15.0)
Immature Granulocytes: 0 %
Lymphocytes Relative: 7 %
Lymphs Abs: 0.8 K/uL (ref 0.7–4.0)
MCH: 27.2 pg (ref 26.0–34.0)
MCHC: 30.4 g/dL (ref 30.0–36.0)
MCV: 89.5 fL (ref 80.0–100.0)
Monocytes Absolute: 1.1 K/uL — ABNORMAL HIGH (ref 0.1–1.0)
Monocytes Relative: 9 %
Neutro Abs: 10 K/uL — ABNORMAL HIGH (ref 1.7–7.7)
Neutrophils Relative %: 82 %
Platelets: 343 K/uL (ref 150–400)
RBC: 3.05 MIL/uL — ABNORMAL LOW (ref 3.87–5.11)
RDW: 20.6 % — ABNORMAL HIGH (ref 11.5–15.5)
WBC: 12.1 K/uL — ABNORMAL HIGH (ref 4.0–10.5)
nRBC: 0 % (ref 0.0–0.2)

## 2023-08-03 LAB — PROTIME-INR
INR: 2.4 — ABNORMAL HIGH (ref 0.8–1.2)
Prothrombin Time: 26.9 s — ABNORMAL HIGH (ref 11.4–15.2)

## 2023-08-03 LAB — COOXEMETRY PANEL
Carboxyhemoglobin: 1.9 % — ABNORMAL HIGH (ref 0.5–1.5)
Methemoglobin: <0.7 % (ref 0.0–1.5)
O2 Saturation: 52.3 %
Total hemoglobin: 8.7 g/dL — ABNORMAL LOW (ref 12.0–16.0)

## 2023-08-03 LAB — BASIC METABOLIC PANEL WITH GFR
Anion gap: 8 (ref 5–15)
BUN: 18 mg/dL (ref 6–20)
CO2: 31 mmol/L (ref 22–32)
Calcium: 8.6 mg/dL — ABNORMAL LOW (ref 8.9–10.3)
Chloride: 93 mmol/L — ABNORMAL LOW (ref 98–111)
Creatinine, Ser: 0.98 mg/dL (ref 0.44–1.00)
GFR, Estimated: >60 mL/min (ref >60)
Glucose, Bld: 90 mg/dL (ref 70–99)
Potassium: 3.5 mmol/L (ref 3.5–5.1)
Sodium: 132 mmol/L — ABNORMAL LOW (ref 135–145)

## 2023-08-03 LAB — PHOSPHORUS: Phosphorus: 3.3 mg/dL (ref 2.5–4.6)

## 2023-08-03 LAB — LACTATE DEHYDROGENASE: LDH: 219 U/L — ABNORMAL HIGH (ref 98–192)

## 2023-08-03 LAB — GLUCOSE, CAPILLARY: Glucose-Capillary: 119 mg/dL — ABNORMAL HIGH (ref 70–99)

## 2023-08-03 LAB — MAGNESIUM: Magnesium: 2 mg/dL (ref 1.7–2.4)

## 2023-08-03 MED ORDER — POTASSIUM CHLORIDE CRYS ER 20 MEQ PO TBCR
40.0000 meq | EXTENDED_RELEASE_TABLET | Freq: Once | ORAL | Status: AC
  Administered 2023-08-03: 40 meq via ORAL
  Filled 2023-08-03: qty 2

## 2023-08-03 MED ORDER — WARFARIN SODIUM 5 MG PO TABS
5.0000 mg | ORAL_TABLET | Freq: Once | ORAL | Status: AC
  Administered 2023-08-03: 5 mg via ORAL
  Filled 2023-08-03: qty 1

## 2023-08-03 NOTE — Progress Notes (Signed)
 PHARMACY - ANTICOAGULATION CONSULT NOTE  Pharmacy Consult for warfarin Indication: LVAD  Allergies  Allergen Reactions   Sulfa  Antibiotics Itching, Swelling and Rash    Lip swelling    Patient Measurements: Height: 5' 10 (177.8 cm) Weight: 112 kg (247 lb) IBW/kg (Calculated) : 68.5 HEPARIN  DW (KG): 94.1  Vital Signs: Temp: 98.4 F (36.9 C) (07/13 1103) Temp Source: Oral (07/13 1103) BP: 86/66 (07/13 1103) Pulse Rate: 84 (07/13 1103)  Labs: Recent Labs    08/01/23 0544 08/02/23 0525 08/03/23 0415  HGB 8.5* 8.5* 8.3*  HCT 27.6* 27.9* 27.3*  PLT 361 364 343  LABPROT 26.1* 26.3* 26.9*  INR 2.3* 2.3* 2.4*  CREATININE 0.86 1.00 0.98    Estimated Creatinine Clearance: 97.3 mL/min (by C-G formula based on SCr of 0.98 mg/dL).   Medical History: Past Medical History:  Diagnosis Date   Anemia    Blood transfusion without reported diagnosis    CAD (coronary artery disease)    Nonobstructive   Closed left ankle fracture    August 30 2012   Clotting disorder (HCC)    DVT (deep venous thrombosis) (HCC)    L leg   Fibroid tumor    HFrEF (heart failure with reduced ejection fraction) (HCC)    Hidradenitis    Hypertension    Mitral regurgitation    Myocardial infarction (HCC)    NICM (nonischemic cardiomyopathy) (HCC)    NSVT (nonsustained ventricular tachycardia) (HCC)    Obesity    OSA on CPAP    Pulmonary embolism (HCC) 04/2013   Sleep apnea     Medications:  Scheduled:   acetaminophen   500 mg Oral TID   amiodarone   200 mg Oral BID   Chlorhexidine  Gluconate Cloth  6 each Topical Daily   digoxin   0.125 mg Oral Daily   Fe Fum-Vit C-Vit B12-FA  1 capsule Oral QPC breakfast   gabapentin   300 mg Oral BID   melatonin  3 mg Oral QHS   pantoprazole   40 mg Oral Daily   senna-docusate  1 tablet Oral Daily   sodium chloride  flush  10-40 mL Intracatheter Q12H   spironolactone   25 mg Oral Daily   torsemide   40 mg Oral Daily   Warfarin - Pharmacist Dosing  Inpatient   Does not apply q1600   Infusions:   milrinone  0.125 mcg/kg/min (08/03/23 0503)    Assessment: 48 yo F who had an LVAD placed on 7/3. PMH significant for HFrEF, severe MR, DVT/PE, pancytopenia. Home warfarin regimen: 10 mg M-F, 5 mg on Sa/Su. Pharmacy has been consulted for warfarin management.  Hgb 8.5 and plts 364 are stable. Patient has been on lower doses of warfarin since LVAD implantation. Eating well. On amiodarone  since 7/4 (POD1), started digoxin  7/12. LDH slowly decreasing, now at 219.  INR today is 2.4, which is therapeutic and stable from yesterday.   Goal of Therapy:  INR 2.0-2.5 Monitor platelets by anticoagulation protocol: Yes   Plan:  Warfarin 5 mg x1 tonight Monitor daily INR, CBC, signs/symptoms of bleeding  Prentice Poisson, PharmD Clinical Pharmacist **Pharmacist phone directory can now be found on [email or web address] ([email or web address]).  Listed under [facility].

## 2023-08-03 NOTE — Plan of Care (Signed)
   Problem: Health Behavior/Discharge Planning: Goal: Ability to manage health-related needs will improve Outcome: Progressing   Problem: Clinical Measurements: Goal: Ability to maintain clinical measurements within normal limits will improve Outcome: Progressing   Problem: Clinical Measurements: Goal: Will remain free from infection Outcome: Progressing

## 2023-08-03 NOTE — Plan of Care (Signed)
  Problem: Education: Goal: Knowledge of General Education information will improve Description: Including pain rating scale, medication(s)/side effects and non-pharmacologic comfort measures Outcome: Progressing   Problem: Health Behavior/Discharge Planning: Goal: Ability to manage health-related needs will improve Outcome: Progressing   Problem: Clinical Measurements: Goal: Ability to maintain clinical measurements within normal limits will improve Outcome: Progressing Goal: Will remain free from infection Outcome: Progressing Goal: Diagnostic test results will improve Outcome: Progressing Goal: Respiratory complications will improve Outcome: Progressing Goal: Cardiovascular complication will be avoided Outcome: Progressing   Problem: Activity: Goal: Risk for activity intolerance will decrease Outcome: Progressing   Problem: Nutrition: Goal: Adequate nutrition will be maintained Outcome: Progressing   Problem: Coping: Goal: Level of anxiety will decrease Outcome: Progressing   Problem: Elimination: Goal: Will not experience complications related to bowel motility Outcome: Progressing Goal: Will not experience complications related to urinary retention Outcome: Progressing   Problem: Pain Managment: Goal: General experience of comfort will improve and/or be controlled Outcome: Progressing   Problem: Safety: Goal: Ability to remain free from injury will improve Outcome: Progressing   Problem: Skin Integrity: Goal: Risk for impaired skin integrity will decrease Outcome: Progressing   Problem: Activity: Goal: Ability to tolerate increased activity will improve Outcome: Progressing   Problem: Respiratory: Goal: Ability to maintain a clear airway and adequate ventilation will improve Outcome: Progressing   Problem: Role Relationship: Goal: Method of communication will improve Outcome: Progressing   Problem: Cardiac: Goal: Ability to achieve and maintain  adequate cardiopulmonary perfusion will improve Outcome: Progressing Goal: Vascular access site(s) Level 0-1 will be maintained Outcome: Progressing   Problem: Fluid Volume: Goal: Ability to achieve a balanced intake and output will improve Outcome: Progressing   Problem: Physical Regulation: Goal: Complications related to the disease process, condition or treatment will be avoided or minimized Outcome: Progressing   Problem: Respiratory: Goal: Will regain and/or maintain adequate ventilation Outcome: Progressing   Problem: Education: Goal: Ability to describe self-care measures that may prevent or decrease complications (Diabetes Survival Skills Education) will improve Outcome: Progressing Goal: Individualized Educational Video(s) Outcome: Progressing   Problem: Coping: Goal: Ability to adjust to condition or change in health will improve Outcome: Progressing   Problem: Fluid Volume: Goal: Ability to maintain a balanced intake and output will improve Outcome: Progressing   Problem: Health Behavior/Discharge Planning: Goal: Ability to identify and utilize available resources and services will improve Outcome: Progressing Goal: Ability to manage health-related needs will improve Outcome: Progressing   Problem: Metabolic: Goal: Ability to maintain appropriate glucose levels will improve Outcome: Progressing   Problem: Nutritional: Goal: Maintenance of adequate nutrition will improve Outcome: Progressing Goal: Progress toward achieving an optimal weight will improve Outcome: Progressing   Problem: Skin Integrity: Goal: Risk for impaired skin integrity will decrease Outcome: Progressing   Problem: Tissue Perfusion: Goal: Adequacy of tissue perfusion will improve Outcome: Progressing

## 2023-08-03 NOTE — Progress Notes (Signed)
 Drive line dressing change:  LVAD driveline dressing was changed using proper sterile technique. Old dressing removed. Driveline site cleaned using chloroprep x2. Silver strip applied. New gauze dressing applied using sterile technique. Driveline secured at driveline site with 1 suture. Driveline site clean, minimal drainage from site, no redness, no swelling, no tenderness, no odor from drainage. Driveline anchor Replaced and is intact and applied correctly.   NEXT DRESSING CHANGE DUE 08/04/2023.   Lauraine FABIENE Ahle RN

## 2023-08-03 NOTE — Progress Notes (Addendum)
 Patient ID: [name] [name], female   DOB: [birth year], 47 y.o.   MRN: [id number]     Advanced Heart Failure Rounding Note  Cardiologist: Ria Commander, DO  Chief Complaint: Cardiogenic Shock Subjective:    6/17: mTEER with sev posterior leaflet restriction resulting in tear. IABP + PAC. PCWP 40, v up to 60 6/24 : Aggressive diuresed. Started on lasix  drip. Brisk diuresis noted.  6/30: Echo with EF 25-30%, RV moderately dysfunctional/mildly dilated, Mitraclip present with severe MR, mild-moderate TR, dilated IVC.  7/3: s/p HM III LVAD and bioprosthetic MVR 7/4: Extubated 7/7: LVAD speed increased to 5800 7/8: LVAD speed increased to 5900 7/9: Weaned milrinone  to 0.125. Ramp echo >> speed increased to 6000  7/11 Milrinone  stopped yesterday Co-ox dropped to 46%. Milrinone  restarted at 0.125.   Remains on milrinone  0.125 Co-ox 52%  CVP 8-9  INR 2.4  Feels good. Ambulating halls. No SOB   LVAD Interrogation HM III: Speed: 6000 Flow: 4.7 PI: 2.8 Power: 5.0. VAD interrogated personally. Parameters stable.  Objective:    Weight Range: 112 kg Body mass index is 35.44 kg/m.   Vital Signs:   Temp:  [97.8 F (36.6 C)-98.6 F (37 C)] 98.6 F (37 C) (07/13 0754) Pulse Rate:  [75-88] 85 (07/13 0552) Resp:  [14-30] 18 (07/13 0754) BP: (82-131)/(58-95) 90/75 (07/13 0754) SpO2:  [90 %-97 %] 90 % (07/13 0552) Weight:  [887 kg] 112 kg (07/13 0552) Last BM Date : 08/01/23  Weight change: Filed Weights   08/01/23 0500 08/02/23 0600 08/03/23 0552  Weight: 112.9 kg 111.5 kg 112 kg   Intake/Output:  Intake/Output Summary (Last 24 hours) at 08/03/2023 0940 Last data filed at 08/03/2023 0757 Gross per 24 hour  Intake 1125.34 ml  Output 2100 ml  Net -974.66 ml   Physical Exam   Vitals:   08/03/23 0552 08/03/23 0754  BP:  90/75  Pulse: 85   Resp:  18  Temp:  98.6 F (37 C)  SpO2: 90%    MAPs  80-90  Physical Exam: General:  NAD.  HEENT: normal  Neck: supple. JVP not  elevated.  Carotids 2+ bilat; no bruits. No lymphadenopathy or thryomegaly appreciated. Cor: LVAD hum.  Lungs: Clear. Abdomen: obese soft, nontender, non-distended. No hepatosplenomegaly. No bruits or masses. Good bowel sounds. Driveline site clean. Anchor in place.  Extremities: no cyanosis, clubbing, rash. Warm no edema  Neuro: alert & oriented x 3. No focal deficits. Moves all 4 without problem    Telemetry   SR 80-90s Personally reviewed  Labs  CBC Recent Labs    08/02/23 0525 08/03/23 0415  WBC 9.4 12.1*  NEUTROABS 7.5 10.0*  HGB 8.5* 8.3*  HCT 27.9* 27.3*  MCV 89.1 89.5  PLT 364 343   Basic Metabolic Panel Recent Labs    92/87/74 0525 08/03/23 0415  NA 131* 132*  K 3.6 3.5  CL 90* 93*  CO2 31 31  GLUCOSE 92 90  BUN 18 18  CREATININE 1.00 0.98  CALCIUM  8.7* 8.6*  MG 1.9 2.0  PHOS 3.6 3.3   Liver Function Tests No results for input(s): AST, ALT, ALKPHOS, BILITOT, PROT, ALBUMIN  in the last 72 hours.  BNP (last 3 results) Recent Labs    07/04/23 1057 07/25/23 0500 07/31/23 0519  BNP 612.0* 938.1* 856.6*   Fasting Lipid Panel No results for input(s): CHOL, HDL, LDLCALC, TRIG, CHOLHDL, LDLDIRECT in the last 72 hours.  Medications:   Scheduled Medications:  acetaminophen   500 mg Oral TID  amiodarone   200 mg Oral BID   Chlorhexidine  Gluconate Cloth  6 each Topical Daily   digoxin   0.125 mg Oral Daily   Fe Fum-Vit C-Vit B12-FA  1 capsule Oral QPC breakfast   gabapentin   300 mg Oral BID   melatonin  3 mg Oral QHS   pantoprazole   40 mg Oral Daily   senna-docusate  1 tablet Oral Daily   sodium chloride  flush  10-40 mL Intracatheter Q12H   spironolactone   25 mg Oral Daily   torsemide   40 mg Oral Daily   Warfarin - Pharmacist Dosing Inpatient   Does not apply q1600   Infusions:  milrinone  0.125 mcg/kg/min (08/03/23 0503)   PRN Medications: ondansetron  (ZOFRAN ) IV, mouth rinse, oxyCODONE , phenol, sodium chloride  flush,  traMADol   Patient Profile   48 y.o. female with history of chronic systolic HF with biventricular dysfunction on home inotrope, severe MR, hx PE/DVT, obesity, CAD, anemia, tobacco use. Presented for mTEER on 07/09/23. Worsening MR leading to cardiogenic shock. S/p LVAD 07/24/23.   Assessment/Plan   Cardiogenic shock>>>now S/P HM III LVAD 07/24/23: due to torrential MR s/p mTEER complicated by severely restricted posterior leaflet that was damaged during repair attempt, IABP and nipride  needed to stabilize. Transitioned to Impella 5.5. Not a transplant candidate with tobacco use.  Nonobstructive CAD on cath 2023.  - 7/3 HM 3 + bioprosthetic MV replacement.  Noted to have acute/subacute pericarditis with inflammatory exudate over heart and pericardium  - Ramp echo 07/09 > speed increased to 6000 - VAD interrogated personally. Parameters stable. - Milrinone  stopped 7/11 Co-ox dropped to 46%. Milrinone  restarted at 0.125. Co-ox stable at 52% - Continue digoxin .  Wean milrinone  as tolerated - Volume status ok Continue torsemide  40 daily  - Continue spiro 25 mg daily  HM 3 LVAD - MAPs ok - VAD interrogated personally. Parameters stable. - LDH 219 - INR 2.4 Discussed warfarin dosing with PharmD personally. - Now off aspirin  - Doing well with discharge teaching   Torrential MR - S/p bioprosthetic MVR.    Acute hypoxic respiratory failure - resolved Hx of PE/DVT w/ IVC filter - Evaluated at [facility] where thrombophilia work-up was negative (Lupus inhibitor, anticardiolipin antibodies, and anti beta-2  glycoprotein antibodies were negative; protein C activity 85%, protein S activity 76%, factor V Leiden, and prothrombin gene mutation ) - IVC filter in place; could not be removed due to significant thrombus burden on filter at [facility]  - Continue Warfarin.INR 2.4   CAD - LHC in 5/23 w/ nonobstructive CAD involving RCA - no s/s angina  Tobacco use - Smoking 3 cigarettes/day at time of last  follow-up.  Anemia  - long h/o IDA, required frequent IV infusions pre-op  - hgb stable at 8.3 - Given IV iron  07/07  PVCs - Low burden - Amiodarone  200 mg bid currently.   AKI - Resolved   Length of Stay: 25  Toribio Fuel, MD  08/03/2023, 9:40 AM  Advanced Heart Failure Team Pager [phone] (M-F; 7a - 5p)  Please contact [facility] for night-coverage after hours (5p -7a ) and weekends on [email or web address]

## 2023-08-04 ENCOUNTER — Encounter (HOSPITAL_COMMUNITY): Payer: Self-pay | Admitting: Surgery

## 2023-08-04 LAB — CBC WITH DIFFERENTIAL/PLATELET
Abs Immature Granulocytes: 0.03 K/uL (ref 0.00–0.07)
Basophils Absolute: 0.1 K/uL (ref 0.0–0.1)
Basophils Relative: 1 %
Eosinophils Absolute: 0.3 K/uL (ref 0.0–0.5)
Eosinophils Relative: 3 %
HCT: 26.6 % — ABNORMAL LOW (ref 36.0–46.0)
Hemoglobin: 8.1 g/dL — ABNORMAL LOW (ref 12.0–15.0)
Immature Granulocytes: 0 %
Lymphocytes Relative: 9 %
Lymphs Abs: 0.9 K/uL (ref 0.7–4.0)
MCH: 27.4 pg (ref 26.0–34.0)
MCHC: 30.5 g/dL (ref 30.0–36.0)
MCV: 89.9 fL (ref 80.0–100.0)
Monocytes Absolute: 0.9 K/uL (ref 0.1–1.0)
Monocytes Relative: 9 %
Neutro Abs: 8 K/uL — ABNORMAL HIGH (ref 1.7–7.7)
Neutrophils Relative %: 78 %
Platelets: 271 K/uL (ref 150–400)
RBC: 2.96 MIL/uL — ABNORMAL LOW (ref 3.87–5.11)
RDW: 20.2 % — ABNORMAL HIGH (ref 11.5–15.5)
WBC: 10.2 K/uL (ref 4.0–10.5)
nRBC: 0 % (ref 0.0–0.2)

## 2023-08-04 LAB — BASIC METABOLIC PANEL WITH GFR
Anion gap: 7 (ref 5–15)
BUN: 13 mg/dL (ref 6–20)
CO2: 31 mmol/L (ref 22–32)
Calcium: 8.5 mg/dL — ABNORMAL LOW (ref 8.9–10.3)
Chloride: 96 mmol/L — ABNORMAL LOW (ref 98–111)
Creatinine, Ser: 0.95 mg/dL (ref 0.44–1.00)
GFR, Estimated: >60 mL/min (ref >60)
Glucose, Bld: 82 mg/dL (ref 70–99)
Potassium: 4 mmol/L (ref 3.5–5.1)
Sodium: 134 mmol/L — ABNORMAL LOW (ref 135–145)

## 2023-08-04 LAB — COOXEMETRY PANEL
Carboxyhemoglobin: 2.1 % — ABNORMAL HIGH (ref 0.5–1.5)
Carboxyhemoglobin: 1.7 % — ABNORMAL HIGH (ref 0.5–1.5)
Methemoglobin: <0.7 % (ref 0.0–1.5)
Methemoglobin: <0.7 % (ref 0.0–1.5)
O2 Saturation: 60.7 %
O2 Saturation: 52 %
Total hemoglobin: 8.7 g/dL — ABNORMAL LOW (ref 12.0–16.0)
Total hemoglobin: 8.1 g/dL — ABNORMAL LOW (ref 12.0–16.0)

## 2023-08-04 LAB — GLUCOSE, CAPILLARY
Glucose-Capillary: 83 mg/dL (ref 70–99)
Glucose-Capillary: 90 mg/dL (ref 70–99)
Glucose-Capillary: 90 mg/dL (ref 70–99)
Glucose-Capillary: 247 mg/dL — ABNORMAL HIGH (ref 70–99)

## 2023-08-04 LAB — LACTATE DEHYDROGENASE: LDH: 217 U/L — ABNORMAL HIGH (ref 98–192)

## 2023-08-04 LAB — PHOSPHORUS: Phosphorus: 3.6 mg/dL (ref 2.5–4.6)

## 2023-08-04 LAB — MAGNESIUM: Magnesium: 1.8 mg/dL (ref 1.7–2.4)

## 2023-08-04 LAB — PROTIME-INR
INR: 2.3 — ABNORMAL HIGH (ref 0.8–1.2)
Prothrombin Time: 26.6 s — ABNORMAL HIGH (ref 11.4–15.2)

## 2023-08-04 MED ORDER — WARFARIN SODIUM 5 MG PO TABS
5.0000 mg | ORAL_TABLET | Freq: Once | ORAL | Status: AC
  Administered 2023-08-04: 5 mg via ORAL
  Filled 2023-08-04: qty 1

## 2023-08-04 NOTE — Care Management Important Message (Signed)
 Important Message  Patient Details  Name: [name] [name] MRN: [id number] Date of Birth: [birth year]   Important Message Given:  Yes - Medicare IM     Claretta Deed 08/04/2023, 3:32 PM

## 2023-08-04 NOTE — Progress Notes (Signed)
 Physical Therapy Treatment Patient Details Name: [name] [name] MRN: [id number] DOB: 08/19/1975 Today's Date: 08/04/2023   History of Present Illness 48 yo presented 07/09/23 for transcatheter mitral valve repair with Mitraclip. Post-op severe residual MR and cardiogenic shock. 6/19 impella inserted; extubated 6/20;. S/p HM III LVAD and bioprosthetic MVR 7/3.  PMH-chronic HF (on home milrinone ); CAD, DVT, fibroid tumor, hidradenitis, cardiomyopathy, NSVT, PE, obesity    PT Comments  Tolerated well, increasing functional capacity. Ambulating at [facility] level with dynamic challenges, no loss of balance, no assistive device. Still becomes moderately dyspneic but resolves with short standing rest break. Feeling more confident. Low fall risk based on DGI. Patient will continue to benefit from skilled physical therapy services to further improve independence with functional mobility.     If plan is discharge home, recommend the following: Assistance with cooking/housework;Assist for transportation;Help with stairs or ramp for entrance;A little help with walking and/or transfers;A little help with bathing/dressing/bathroom   Can travel by private Psychologist, clinical (4 wheels);BSC/3in1    Recommendations for Other Services       Precautions / Restrictions Precautions Precautions: Sternal;Fall;Other (comment) Recall of Precautions/Restrictions: Intact Precaution/Restrictions Comments: LVAD Required Braces or Orthoses:  (LVAD kit) Restrictions Other Position/Activity Restrictions: Sternal precautions     Mobility  Bed Mobility Overal bed mobility: Needs Assistance Bed Mobility: Supine to Sit     Supine to sit: HOB elevated, Supervision     General bed mobility comments: Supervision for safety and equipment awareness. No physical assist needed.    Transfers Overall transfer level: Needs assistance Equipment used: None Transfers: Sit to/from Stand Sit  to Stand: Supervision           General transfer comment: Supervision for safety, slow to rise and extend trunk but stable once upright.    Ambulation/Gait Ambulation/Gait assistance: Contact guard assist Gait Distance (Feet): 415 Feet Assistive device: None Gait Pattern/deviations: Step-through pattern, Decreased stride length, Wide base of support Gait velocity: dec Gait velocity interpretation: 1.31 - 2.62 ft/sec, indicative of limited community ambulator   General Gait Details: Grossly stable with forward gait. Minor instability with dynamic challenges no overt LOB noted. Required one standing rest break to complete. RPE avg 5/10. Moderate dyspnea towards end of distance. Educated on symptom awareness and RPE, as well as energy conservation techniques.   Stairs             Wheelchair Mobility     Tilt Bed    Modified Rankin (Stroke Patients Only)       Balance Overall balance assessment: Needs assistance Sitting-balance support: Feet supported, No upper extremity supported Sitting balance-Leahy Scale: Good     Standing balance support: During functional activity, No upper extremity supported Standing balance-Leahy Scale: Good Standing balance comment: no UB support                 Standardized Balance Assessment Standardized Balance Assessment : Dynamic Gait Index   Dynamic Gait Index Level Surface: Normal Change in Gait Speed: Normal Gait with Horizontal Head Turns: Normal Gait with Vertical Head Turns: Normal Gait and Pivot Turn: Normal Step Over Obstacle: Mild Impairment Step Around Obstacles: Normal Steps: Mild Impairment Total Score: 22      Communication Communication Communication: No apparent difficulties  Cognition Arousal: Alert Behavior During Therapy: WFL for tasks assessed/performed   PT - Cognitive impairments: No apparent impairments  Following commands: Intact      Cueing Cueing  Techniques: Verbal cues  Exercises Other Exercises Other Exercises: States she has been compliant with all exercises    General Comments General comments (skin integrity, edema, etc.): Recalls 2/3 items in carry kit. Reviewed. Reviewed precautions.      Pertinent Vitals/Pain Pain Assessment Pain Assessment: Faces Faces Pain Scale: Hurts a little bit Pain Location: sternum Pain Descriptors / Indicators: Guarding, Sore    Home Living                          Prior Function            PT Goals (current goals can now be found in the care plan section) Acute Rehab PT Goals Patient Stated Goal: to get better PT Goal Formulation: With patient Time For Goal Achievement: 08/09/23 Potential to Achieve Goals: Good Progress towards PT goals: Progressing toward goals    Frequency    Min 2X/week      PT Plan      Co-evaluation              AM-PAC PT 6 Clicks Mobility   Outcome Measure  Help needed turning from your back to your side while in a flat bed without using bedrails?: A Little Help needed moving from lying on your back to sitting on the side of a flat bed without using bedrails?: A Little Help needed moving to and from a bed to a chair (including a wheelchair)?: A Little Help needed standing up from a chair using your arms (e.g., wheelchair or bedside chair)?: A Little Help needed to walk in hospital room?: A Little Help needed climbing 3-5 steps with a railing? : A Little 6 Click Score: 18    End of Session Equipment Utilized During Treatment: Other (comment);Oxygen (LVAD kit) Activity Tolerance: Patient tolerated treatment well Patient left: with call bell/phone within reach;in chair Nurse Communication: Mobility status PT Visit Diagnosis: Unsteadiness on feet (R26.81);Muscle weakness (generalized) (M62.81);Difficulty in walking, not elsewhere classified (R26.2);Pain;Other abnormalities of gait and mobility (R26.89) Pain - part of body:   (sternum)     Time: 8674-8658 PT Time Calculation (min) (ACUTE ONLY): 16 min  Charges:    $Gait Training: 8-22 mins PT General Charges $$ ACUTE PT VISIT: 1 Visit                     Leontine Roads, PT, DPT [facility]  Rehabilitation Services Physical Therapist Office: [phone] Website: [email or web address]    Leontine GORMAN Roads 08/04/2023, 2:42 PM

## 2023-08-04 NOTE — Progress Notes (Signed)
 LVAD Coordinator Rounding Note:  Admitted 07/09/23 post mitral clip with severe residual mitral regurgitation and cardiogenic shock. Impella 5.5 placed 07/10/23 and VAD evaluation initiated.  HM III LVAD implanted on 07/24/23 by Dr. Lucas under destination criteria.  6/17: mTEER with sev posterior leaflet restriction resulting in tear. IABP + PAC. PCWP 40, v up to 60 6/24 : Aggressive diuresed. Started on lasix  drip. Brisk diuresis noted.  6/30: Echo with EF 25-30%, RV moderately dysfunctional/mildly dilated, Mitraclip present with severe MR, mild-moderate TR, dilated IVC.  7/3: s/p HM III LVAD and bioprosthetic MVR 7/4: Extubated 7/7: Speed increased to 5800 7/8: Speed increased to 5900 7/9: Decrease Milrinone  to 0.125 mcg. Speed increased to 6000.   Pt sitting up in the chair on my arrival. States she is feeling pretty good today. She has walked multiple laps in the hallway already this morning.   Milrinone  stopped this morning. For repeat co-ox this afternoon.   D/c teaching completed 08/01/23. Please see separate note for details. Ongoing dressing change teaching with pts cousin NeeNee. Home equipment has arrived.   Vital signs: Temp: 97.8 HR: 84 Doppler Pressure: 84 Automatic BP: 92/78 (84) O2 Sat: 94% on RA Wt: 250.8>246.9>248.4>247.8>248.9>243.6 lbs  LVAD interrogation reveals:  Speed: 6000 Flow: 4.6 Power:  4.8 w PI: 3.1  Alarms: none Events:  none Hematocrit: 27  Fixed speed: 6000 Low speed limit: 5700  Drive Line: Existing VAD dressing removed and site care performed using sterile technique. Drive line exit site cleaned with Chlora prep applicators x 2, allowed to dry, and gauze dressing with silver strip applied flush to exit site. Exit site healing and unincorporated, the velour is fully implanted at exit site; hub is exposed at site probably due to fluid volume loss. Suture intact. Moderate serousangenous drainage. No redness, tenderness, foul odor or rash noted.  Drive line anchor secured. Continue daily dressing changes. Next dressing change 08/05/23 by VAD Coordinator or nurse champion.     Labs:  LDH trend: 476>390>338>301>264>217  INR trend: 1.4>1.8>1.7>1.8>2.3>2.3  Anticoagulation Plan: -INR Goal: 2.0-2.5 -ASA Dose: n/a  Blood Products:  Intra-op: 2 FFP, 1 Platelet, 2 Cryo Post-op:  07/24/23: 1 PRBC 07/25/23:1 PRBC  Device:  Arrythmias:  HX: NSVT on p.o. amiodarone   Respiratory: extubated 07/25/23  Infection:  Renal:  -BUN/CRT: 24/1.15>1.02>24/0.87>23/0.74  Drips:  Milrinone  0.125 mcg/kg/min-- off 7/14  Adverse Events on VAD:  VAD Education: D/c teaching completed 08/01/23 - see separate note for details.  NeeNee performed dressing change with VAD coordinator supervision. Plan for her to perform dressing again tomorrow with VAD coordinator.   Plan/Recommendations:  1. Page VAD coordinator with any VAD alarms or equipment issues. 2. Drive line dressing to be changed daily by VAD Coordinator or Oncologist.  [name] RN VAD Coordinator  Office: [phone]  24/7 Pager: [phone]

## 2023-08-04 NOTE — Progress Notes (Signed)
 Patient ID: [name] [name], female   DOB: 1976-01-14, 48 y.o.   MRN: [id number]     Advanced Heart Failure Rounding Note  Cardiologist: Ria Commander, DO  Chief Complaint: Cardiogenic Shock Subjective:    6/17: mTEER with sev posterior leaflet restriction resulting in tear. IABP + PAC. PCWP 40, v up to 60 6/24 : Aggressive diuresed. Started on lasix  drip. Brisk diuresis noted.  6/30: Echo with EF 25-30%, RV moderately dysfunctional/mildly dilated, Mitraclip present with severe MR, mild-moderate TR, dilated IVC.  7/3: s/p HM III LVAD and bioprosthetic MVR 7/4: Extubated 7/7: LVAD speed increased to 5800 7/8: LVAD speed increased to 5900 7/9: Weaned milrinone  to 0.125. Ramp echo >> speed increased to 6000  7/11 Milrinone  stopped yesterday Co-ox dropped to 46%. Milrinone  restarted at 0.125.   Milrinone  stopped on 7/14, coox pending for this afternoon. CVP around 10 this morning. Coox 60.  LVAD Interrogation HM III: Speed: 6000 Flow: 4.7 PI: 3 Power: 5. VAD interrogated personally. Parameters stable. Minimal PI events  Objective:    Weight Range: 110.5 kg Body mass index is 34.95 kg/m.   Vital Signs:   Temp:  [97.8 F (36.6 C)-98.5 F (36.9 C)] 98.5 F (36.9 C) (07/14 1127) Pulse Rate:  [60-85] 60 (07/14 1127) Resp:  [18-20] 20 (07/14 1127) BP: (74-137)/(53-102) 110/87 (07/14 1127) SpO2:  [93 %-97 %] 94 % (07/14 1127) FiO2 (%):  [21 %] 21 % (07/14 0135) Weight:  [110.5 kg] 110.5 kg (07/14 0526) Last BM Date : 08/01/23  Weight change: Filed Weights   08/02/23 0600 08/03/23 0552 08/04/23 0526  Weight: 111.5 kg 112 kg 110.5 kg   Intake/Output:  Intake/Output Summary (Last 24 hours) at 08/04/2023 1217 Last data filed at 08/04/2023 0612 Gross per 24 hour  Intake 1008.06 ml  Output 600 ml  Net 408.06 ml   Physical Exam   Vitals:   08/04/23 0843 08/04/23 1127  BP:  110/87  Pulse: 82 60  Resp:  20  Temp:  98.5 F (36.9 C)  SpO2:  94%   MAPs  80s  Physical  Exam: General:  NAD.  Cor: LVAD hum. JVP mildly elevated, systolic murmur, 1+ LE edema Lungs: Clear. Abdomen: soft, nontender, Driveline site clean. Anchor in place.  Extremities: no cyanosis, clubbing, rash. Warm no edema  Neuro: alert & oriented x 3. No focal deficits. Moves all 4 without problem     Labs  CBC Recent Labs    08/03/23 0415 08/04/23 0455  WBC 12.1* 10.2  NEUTROABS 10.0* 8.0*  HGB 8.3* 8.1*  HCT 27.3* 26.6*  MCV 89.5 89.9  PLT 343 271   Basic Metabolic Panel Recent Labs    92/86/74 0415 08/04/23 0455  NA 132* 134*  K 3.5 4.0  CL 93* 96*  CO2 31 31  GLUCOSE 90 82  BUN 18 13  CREATININE 0.98 0.95  CALCIUM  8.6* 8.5*  MG 2.0 1.8  PHOS 3.3 3.6   Liver Function Tests No results for input(s): AST, ALT, ALKPHOS, BILITOT, PROT, ALBUMIN  in the last 72 hours.  BNP (last 3 results) Recent Labs    07/04/23 1057 07/25/23 0500 07/31/23 0519  BNP 612.0* 938.1* 856.6*   Fasting Lipid Panel No results for input(s): CHOL, HDL, LDLCALC, TRIG, CHOLHDL, LDLDIRECT in the last 72 hours.  Medications:   Scheduled Medications:  acetaminophen   500 mg Oral TID   amiodarone   200 mg Oral BID   Chlorhexidine  Gluconate Cloth  6 each Topical Daily   digoxin   0.125 mg Oral Daily   Fe Fum-Vit C-Vit B12-FA  1 capsule Oral QPC breakfast   gabapentin   300 mg Oral BID   melatonin  3 mg Oral QHS   pantoprazole   40 mg Oral Daily   senna-docusate  1 tablet Oral Daily   sodium chloride  flush  10-40 mL Intracatheter Q12H   spironolactone   25 mg Oral Daily   torsemide   40 mg Oral Daily   Warfarin - Pharmacist Dosing Inpatient   Does not apply q1600   Infusions:   PRN Medications: ondansetron  (ZOFRAN ) IV, mouth rinse, oxyCODONE , phenol, sodium chloride  flush, traMADol   Patient Profile   48 y.o. female with history of chronic systolic HF with biventricular dysfunction on home inotrope, severe MR, hx PE/DVT, obesity, CAD, anemia, tobacco use.  Presented for mTEER on 07/09/23. Worsening MR leading to cardiogenic shock. S/p LVAD 07/24/23.   Assessment/Plan   Cardiogenic shock>>>now S/P HM III LVAD 07/24/23: due to torrential MR s/p mTEER complicated by severely restricted posterior leaflet that was damaged during repair attempt, IABP and nipride  needed to stabilize. Transitioned to Impella 5.5. Not a transplant candidate with tobacco use.   - 7/3 HM 3 + bioprosthetic MV replacement.  Noted to have acute/subacute pericarditis with inflammatory exudate over heart and pericardium  - Ramp echo 07/09 > speed increased to 6000 - VAD interrogated personally, parameters stable - Milrinone  stopped 7/11 Co-ox dropped to 46%. Milrinone  restarted at 0.125. coox now 60, will stop this morning. Repeat this evening. If stable can hopefully discharge in the next 24-48 if LVAD teaching complete - Continue digoxin . - Volume status stable, Continue torsemide  40 daily  - Continue spiro 25 mg daily  HM 3 LVAD - MAPs stable - VAD interrogated personally. Parameters stable. - LDH 219 - INR 2.3, discussed warfarin dosing with pharmD personally - off aspirin  - Doing well with discharge teaching   Torrential MR - S/p bioprosthetic MVR.    Acute hypoxic respiratory failure - resolved  Hx of PE/DVT w/ IVC filter - Evaluated at [facility] where thrombophilia work-up was negative (Lupus inhibitor, anticardiolipin antibodies, and anti beta-2  glycoprotein antibodies were negative; protein C activity 85%, protein S activity 76%, factor V Leiden, and prothrombin gene mutation ) - IVC filter in place; could not be removed due to significant thrombus burden on filter at [facility]  - Continue Warfarin INR 2.3   CAD - LHC in 5/23 w/ nonobstructive CAD involving RCA - no s/s angina  Tobacco use - Smoking 3 cigarettes/day at time of last follow-up.  Anemia  - long h/o IDA, required frequent IV infusions pre-op  - hgb stable at 8.3 - Given IV iron  07/07  PVCs - Low  burden - Amiodarone  200 mg bid currently.   AKI - Resolved   Length of Stay: 26  Morene JINNY Brownie, MD  08/04/2023, 12:17 PM  Advanced Heart Failure Team Pager [phone] (M-F; 7a - 5p)  Please contact [facility] for night-coverage after hours (5p -7a ) and weekends on [email or web address]

## 2023-08-04 NOTE — Progress Notes (Signed)
 PHARMACY - ANTICOAGULATION CONSULT NOTE  Pharmacy Consult for warfarin Indication: LVAD  Allergies  Allergen Reactions   Sulfa  Antibiotics Itching, Swelling and Rash    Lip swelling    Patient Measurements: Height: 5' 10 (177.8 cm) Weight: 110.5 kg (243 lb 9.7 oz) IBW/kg (Calculated) : 68.5 HEPARIN  DW (KG): 94.1  Vital Signs: Temp: 97.8 F (36.6 C) (07/14 0738) Temp Source: Oral (07/14 0738) BP: 92/78 (07/14 0738) Pulse Rate: 82 (07/14 0843)  Labs: Recent Labs    08/02/23 0525 08/03/23 0415 08/04/23 0455  HGB 8.5* 8.3* 8.1*  HCT 27.9* 27.3* 26.6*  PLT 364 343 271  LABPROT 26.3* 26.9* 26.6*  INR 2.3* 2.4* 2.3*  CREATININE 1.00 0.98 0.95    Estimated Creatinine Clearance: 99.6 mL/min (by C-G formula based on SCr of 0.95 mg/dL).   Medical History: Past Medical History:  Diagnosis Date   Anemia    Blood transfusion without reported diagnosis    CAD (coronary artery disease)    Nonobstructive   Closed left ankle fracture    August 30 2012   Clotting disorder (HCC)    DVT (deep venous thrombosis) (HCC)    L leg   Fibroid tumor    HFrEF (heart failure with reduced ejection fraction) (HCC)    Hidradenitis    Hypertension    Mitral regurgitation    Myocardial infarction (HCC)    NICM (nonischemic cardiomyopathy) (HCC)    NSVT (nonsustained ventricular tachycardia) (HCC)    Obesity    OSA on CPAP    Pulmonary embolism (HCC) 04/2013   Sleep apnea     Medications:  Scheduled:   acetaminophen   500 mg Oral TID   amiodarone   200 mg Oral BID   Chlorhexidine  Gluconate Cloth  6 each Topical Daily   digoxin   0.125 mg Oral Daily   Fe Fum-Vit C-Vit B12-FA  1 capsule Oral QPC breakfast   gabapentin   300 mg Oral BID   melatonin  3 mg Oral QHS   pantoprazole   40 mg Oral Daily   senna-docusate  1 tablet Oral Daily   sodium chloride  flush  10-40 mL Intracatheter Q12H   spironolactone   25 mg Oral Daily   torsemide   40 mg Oral Daily   Warfarin - Pharmacist Dosing  Inpatient   Does not apply q1600   Infusions:     Assessment: 48 yo F who had an LVAD placed on 7/3. PMH significant for HFrEF, severe MR, DVT/PE, pancytopenia. Home warfarin regimen: 10 mg M-F, 5 mg on Sa/Su. Pharmacy has been consulted for warfarin management.  INR is therapeutic at 2.3. Hgb 8.1, plt 271. LDH stable at 217. Patient has been on lower doses of warfarin since LVAD implantation. Eating well (documented at 100%). On amiodarone  since 7/4 (POD1), started digoxin  7/12. Has required 42.5 mg/week recently.  Goal of Therapy:  INR 2.0-2.5 Monitor platelets by anticoagulation protocol: Yes   Plan:  Warfarin 5 mg x1 tonight If discharges would consider warfarin 5 mg daily (using 5 mg tablets) except 7.5 mg on Tues/Thurs/Sat Monitor daily INR, CBC, signs/symptoms of bleeding  Thank you for allowing pharmacy to participate in this patient's care,  Suzen Sour, PharmD, BCCCP Clinical Pharmacist  Phone: [phone] 08/04/2023 10:59 AM  Please check AMION for all [facility] Pharmacy phone numbers After 10:00 PM, call Main Pharmacy [phone]

## 2023-08-04 NOTE — Plan of Care (Signed)
 Problem: Education: Goal: Knowledge of General Education information will improve Description: Including pain rating scale, medication(s)/side effects and non-pharmacologic comfort measures 08/04/2023 2223 by Lilian Glenys SAUNDERS, RN Outcome: Progressing 08/04/2023 2223 by Lilian Glenys SAUNDERS, RN Outcome: Progressing   Problem: Health Behavior/Discharge Planning: Goal: Ability to manage health-related needs will improve 08/04/2023 2223 by Shoaib Siefker, Glenys SAUNDERS, RN Outcome: Progressing 08/04/2023 2223 by Lilian Glenys SAUNDERS, RN Outcome: Progressing   Problem: Clinical Measurements: Goal: Ability to maintain clinical measurements within normal limits will improve 08/04/2023 2223 by Joziyah Roblero, Glenys SAUNDERS, RN Outcome: Progressing 08/04/2023 2223 by Lilian Glenys SAUNDERS, RN Outcome: Progressing Goal: Will remain free from infection 08/04/2023 2223 by Lilian Glenys SAUNDERS, RN Outcome: Progressing 08/04/2023 2223 by Lilian Glenys SAUNDERS, RN Outcome: Progressing Goal: Diagnostic test results will improve 08/04/2023 2223 by Lilian Glenys SAUNDERS, RN Outcome: Progressing 08/04/2023 2223 by Lilian Glenys SAUNDERS, RN Outcome: Progressing Goal: Respiratory complications will improve 08/04/2023 2223 by Lilian Glenys SAUNDERS, RN Outcome: Progressing 08/04/2023 2223 by Lilian Glenys SAUNDERS, RN Outcome: Progressing Goal: Cardiovascular complication will be avoided 08/04/2023 2223 by Lilian Glenys SAUNDERS, RN Outcome: Progressing 08/04/2023 2223 by Lilian Glenys SAUNDERS, RN Outcome: Progressing   Problem: Activity: Goal: Risk for activity intolerance will decrease 08/04/2023 2223 by Lilian Glenys SAUNDERS, RN Outcome: Progressing 08/04/2023 2223 by Lilian Glenys SAUNDERS, RN Outcome: Progressing   Problem: Nutrition: Goal: Adequate nutrition will be maintained 08/04/2023 2223 by Lilian Glenys SAUNDERS, RN Outcome: Progressing 08/04/2023 2223 by Lilian Glenys SAUNDERS, RN Outcome: Progressing   Problem: Coping: Goal: Level of anxiety will  decrease 08/04/2023 2223 by Lilian Glenys SAUNDERS, RN Outcome: Progressing 08/04/2023 2223 by Lilian Glenys SAUNDERS, RN Outcome: Progressing   Problem: Elimination: Goal: Will not experience complications related to bowel motility 08/04/2023 2223 by Lilian Glenys SAUNDERS, RN Outcome: Progressing 08/04/2023 2223 by Lilian Glenys SAUNDERS, RN Outcome: Progressing Goal: Will not experience complications related to urinary retention 08/04/2023 2223 by Ajaya Crutchfield, Glenys SAUNDERS, RN Outcome: Progressing 08/04/2023 2223 by Lilian Glenys SAUNDERS, RN Outcome: Progressing   Problem: Pain Managment: Goal: General experience of comfort will improve and/or be controlled 08/04/2023 2223 by Lilian Glenys SAUNDERS, RN Outcome: Progressing 08/04/2023 2223 by Lilian Glenys SAUNDERS, RN Outcome: Progressing   Problem: Safety: Goal: Ability to remain free from injury will improve 08/04/2023 2223 by Hubert Raatz, Glenys SAUNDERS, RN Outcome: Progressing 08/04/2023 2223 by Lilian Glenys SAUNDERS, RN Outcome: Progressing   Problem: Skin Integrity: Goal: Risk for impaired skin integrity will decrease 08/04/2023 2223 by Lilian Glenys SAUNDERS, RN Outcome: Progressing 08/04/2023 2223 by Lilian Glenys SAUNDERS, RN Outcome: Progressing   Problem: Activity: Goal: Ability to tolerate increased activity will improve 08/04/2023 2223 by Evone Arseneau, Glenys SAUNDERS, RN Outcome: Progressing 08/04/2023 2223 by Lilian Glenys SAUNDERS, RN Outcome: Progressing   Problem: Respiratory: Goal: Ability to maintain a clear airway and adequate ventilation will improve 08/04/2023 2223 by Ted Goodner, Glenys SAUNDERS, RN Outcome: Progressing 08/04/2023 2223 by Lilian Glenys SAUNDERS, RN Outcome: Progressing   Problem: Role Relationship: Goal: Method of communication will improve 08/04/2023 2223 by Marjan Rosman, Glenys SAUNDERS, RN Outcome: Progressing 08/04/2023 2223 by Lilian Glenys SAUNDERS, RN Outcome: Progressing   Problem: Cardiac: Goal: Ability to achieve and maintain adequate cardiopulmonary perfusion will  improve 08/04/2023 2223 by Lilian Glenys SAUNDERS, RN Outcome: Progressing 08/04/2023 2223 by Lilian Glenys SAUNDERS, RN Outcome: Progressing Goal: Vascular access site(s) Level 0-1 will be maintained 08/04/2023 2223 by Lilian Glenys SAUNDERS, RN Outcome: Progressing 08/04/2023 2223 by Lilian Glenys SAUNDERS, RN Outcome: Progressing   Problem: Fluid Volume: Goal: Ability  to achieve a balanced intake and output will improve 08/04/2023 2223 by Aimee Heldman, Glenys SAUNDERS, RN Outcome: Progressing 08/04/2023 2223 by Lilian Glenys SAUNDERS, RN Outcome: Progressing   Problem: Physical Regulation: Goal: Complications related to the disease process, condition or treatment will be avoided or minimized Outcome: Progressing   Problem: Respiratory: Goal: Will regain and/or maintain adequate ventilation Outcome: Progressing   Problem: Education: Goal: Ability to describe self-care measures that may prevent or decrease complications (Diabetes Survival Skills Education) will improve Outcome: Progressing Goal: Individualized Educational Video(s) Outcome: Progressing   Problem: Coping: Goal: Ability to adjust to condition or change in health will improve Outcome: Progressing   Problem: Fluid Volume: Goal: Ability to maintain a balanced intake and output will improve Outcome: Progressing   Problem: Health Behavior/Discharge Planning: Goal: Ability to identify and utilize available resources and services will improve Outcome: Progressing Goal: Ability to manage health-related needs will improve Outcome: Progressing   Problem: Metabolic: Goal: Ability to maintain appropriate glucose levels will improve Outcome: Progressing   Problem: Nutritional: Goal: Maintenance of adequate nutrition will improve Outcome: Progressing Goal: Progress toward achieving an optimal weight will improve Outcome: Progressing   Problem: Skin Integrity: Goal: Risk for impaired skin integrity will decrease Outcome: Progressing   Problem:  Tissue Perfusion: Goal: Adequacy of tissue perfusion will improve Outcome: Progressing

## 2023-08-04 NOTE — Progress Notes (Addendum)
  [facility]   MULTIDISCIPLINARY HEART VALVE TEAM   Pt underwent mTEER on 07/09/23. Her severely restricted posterior leaflet was damaged during repair attempt and she developed torrential MR requiring IABP and nipride  to stabilize. She is now s/p LVAD for destination therapy and mitral valve replacement .  NYHA class II symptoms currently.  [place] Cardiomyopathy Questionnaire     08/04/2023    8:06 AM 06/19/2023    9:21 AM 06/16/2023   12:51 AM  KCCQ-12  1 a. Ability to shower/bathe Not at all limited Not at all limited Slightly limited  1 b. Ability to walk 1 block Slightly limited Slightly limited Extremely limited  1 c. Ability to hurry/jog Other, Did not do Extremely limited Extremely limited  2. Edema feet/ankles/legs Less than once a week Never over the past 2 weeks Less than once a week  3. Limited by fatigue At least once a day Less than once a week 3+ times per week, not every day  4. Limited by dyspnea Less than once a week 1-2 times a week Several times a day  5. Sitting up / on 3+ pillows Never over the past 2 weeks Never over the past 2 weeks 3+ times a week, not every day  6. Limited enjoyment of life Slightly limited Slightly limited Extremely limited  7. Rest of life w/ symptoms Mostly satisfied Not at all satisfied Somewhat satisfied  8 a. Participation in hobbies Slightly limited Slightly limited Moderately limited  8 b. Participation in chores Slightly limited Slightly limited Moderately limited  8 c. Visiting family/friends Slightly limited Did not limit at all Moderately limited    Will follow along for echo at the next indicated time.   Lamarr Hummer PA-C  [facility]

## 2023-08-04 NOTE — Progress Notes (Signed)
Placed patient on CPAP for the night.  

## 2023-08-04 NOTE — Plan of Care (Signed)
  Problem: Education: Goal: Knowledge of General Education information will improve Description: Including pain rating scale, medication(s)/side effects and non-pharmacologic comfort measures Outcome: Progressing   Problem: Health Behavior/Discharge Planning: Goal: Ability to manage health-related needs will improve Outcome: Progressing   Problem: Clinical Measurements: Goal: Ability to maintain clinical measurements within normal limits will improve Outcome: Progressing Goal: Diagnostic test results will improve Outcome: Progressing Goal: Respiratory complications will improve Outcome: Progressing Goal: Cardiovascular complication will be avoided Outcome: Progressing   Problem: Activity: Goal: Risk for activity intolerance will decrease Outcome: Progressing   Problem: Nutrition: Goal: Adequate nutrition will be maintained Outcome: Progressing   Problem: Pain Managment: Goal: General experience of comfort will improve and/or be controlled Outcome: Progressing   Problem: Skin Integrity: Goal: Risk for impaired skin integrity will decrease Outcome: Progressing

## 2023-08-04 NOTE — Progress Notes (Signed)
   08/04/23 2251  BiPAP/CPAP/SIPAP  $ Non-Invasive Ventilator  Non-Invasive Vent Subsequent  BiPAP/CPAP/SIPAP Pt Type Adult  BiPAP/CPAP/SIPAP Resmed  Mask Type Full face mask  Mask Size Medium  EPAP 11 cmH2O  FiO2 (%) 21 %  Patient Home Machine No  Patient Home Mask No  Patient Home Tubing No  Auto Titrate No  CPAP/SIPAP surface wiped down Yes  Device Plugged into RED Power Outlet Yes  BiPAP/CPAP /SiPAP Vitals  Pulse Rate 73  Resp 18  SpO2 95 %  Bilateral Breath Sounds Clear  MEWS Score/Color  MEWS Score 0  MEWS Score Color Landy

## 2023-08-04 NOTE — Progress Notes (Incomplete)
 Nutrition Follow-up  DOCUMENTATION CODES:   Not applicable  INTERVENTION:  Pt on Carb Modified Diet; no hx of DM and no hyperglycemia, no insulin  ordered (not even sliding scale), possible hypoglycemia this AM. Recommend considering liberalizing diet.  Encourage protein intake with each meal   Continues snacks, small frequent meals if needed   NUTRITION DIAGNOSIS:   Increased nutrient needs related to acute illness as evidenced by estimated needs.    GOAL:  Patient will meet greater than or equal to 90% of their needs  MONITOR:  PO intake, Supplement acceptance, Labs, Weight trends  REASON FOR ASSESSMENT:  Consult Enteral/tube feeding initiation and management  ASSESSMENT:  Pt presented for mitral valve clip procedure. Procedure c/b severe residual MR and cardiogenic shock.  PMH significant for nonischemic cardiomyopathy, chronic biventricular HFrEF, obesity hypoventilation syndrome  6/18: s/p mTEER procedure with severe residual MR, severe posterior leaflet restriction result in tear, cardiogenic shock, IABP placement 6/19: OR for impella 5.5 placement 6/20: extubated 6/27: PFTs with severely reduced volumes 6/30: Bedside ECHO with severe MR, RV mod dilated with mild dysfunction 7/03: OR for HM3 LVAD and bioprosthetic MVR Impella 5.5 removed 7/04: Extubated 7/11: milrinone  stopped 7/12 transferred out of ICU   Average Meal Intake 7/11: 100% x1 documented meal 7/12: 50-75% x2 documented meals 7/13: 50% x1, 100% x2 documented meals   Admit Weight: 110.2 kg Current Weight: 110.5 kg  BUN/Crt have stabilized.  Drains/Lines: R brachial: PICC (placed 7/08)   Meds: Trigels-F Forte, melatonin, pantoprazole , senna-docusate, spironolactone    Labs: Na+ 131>132>134 (L) K+ 4.0 (wdl) WBC 12.1>10.2 (wdl) CBGs 82-90 x24 hours J8r 5.0 (06/2023)    NUTRITION - FOCUSED PHYSICAL EXAM:  {RD Focused Exam List:21252}  Diet Order:   Diet Order             Diet Carb  Modified Fluid consistency: Thin; Room service appropriate? Yes  Diet effective now            EDUCATION NEEDS:  No education needs have been identified at this time  Skin:  Skin Assessment: Skin Integrity Issues: Skin Integrity Issues:: Incisions Incisions: Driveline-new LVAD on 7/3  Last BM:  7/11 per report  Height:  Ht Readings from Last 1 Encounters:  07/25/23 5' 10 (1.778 m)   Weight:  Wt Readings from Last 1 Encounters:  08/04/23 110.5 kg   Ideal Body Weight:  68.2 kg  BMI:  Body mass index is 34.95 kg/m.  Estimated Nutritional Needs:   Kcal:  1900-2100  Protein:  100-115g  Fluid:  >/=1.9L  Blair Deaner MS, RD, LDN Registered Dietitian Clinical Nutrition RD Inpatient Contact Info in Amion

## 2023-08-05 ENCOUNTER — Encounter (HOSPITAL_COMMUNITY): Payer: Self-pay | Admitting: Hematology

## 2023-08-05 ENCOUNTER — Other Ambulatory Visit (HOSPITAL_COMMUNITY): Payer: Self-pay

## 2023-08-05 LAB — CBC WITH DIFFERENTIAL/PLATELET
Abs Immature Granulocytes: 0.05 K/uL (ref 0.00–0.07)
Basophils Absolute: 0.1 K/uL (ref 0.0–0.1)
Basophils Relative: 1 %
Eosinophils Absolute: 0.3 K/uL (ref 0.0–0.5)
Eosinophils Relative: 3 %
HCT: 28.9 % — ABNORMAL LOW (ref 36.0–46.0)
Hemoglobin: 8.7 g/dL — ABNORMAL LOW (ref 12.0–15.0)
Immature Granulocytes: 1 %
Lymphocytes Relative: 8 %
Lymphs Abs: 0.8 K/uL (ref 0.7–4.0)
MCH: 27.2 pg (ref 26.0–34.0)
MCHC: 30.1 g/dL (ref 30.0–36.0)
MCV: 90.3 fL (ref 80.0–100.0)
Monocytes Absolute: 0.7 K/uL (ref 0.1–1.0)
Monocytes Relative: 7 %
Neutro Abs: 7.8 K/uL — ABNORMAL HIGH (ref 1.7–7.7)
Neutrophils Relative %: 80 %
Platelets: 252 K/uL (ref 150–400)
RBC: 3.2 MIL/uL — ABNORMAL LOW (ref 3.87–5.11)
RDW: 20 % — ABNORMAL HIGH (ref 11.5–15.5)
WBC: 9.8 K/uL (ref 4.0–10.5)
nRBC: 0 % (ref 0.0–0.2)

## 2023-08-05 LAB — COOXEMETRY PANEL
Carboxyhemoglobin: 1.6 % — ABNORMAL HIGH (ref 0.5–1.5)
Methemoglobin: <0.7 % (ref 0.0–1.5)
O2 Saturation: 51.2 %
Total hemoglobin: 9.1 g/dL — ABNORMAL LOW (ref 12.0–16.0)

## 2023-08-05 LAB — GLUCOSE, CAPILLARY
Glucose-Capillary: 72 mg/dL (ref 70–99)
Glucose-Capillary: 104 mg/dL — ABNORMAL HIGH (ref 70–99)
Glucose-Capillary: 105 mg/dL — ABNORMAL HIGH (ref 70–99)

## 2023-08-05 LAB — BASIC METABOLIC PANEL WITH GFR
Anion gap: 10 (ref 5–15)
BUN: 15 mg/dL (ref 6–20)
CO2: 29 mmol/L (ref 22–32)
Calcium: 8.8 mg/dL — ABNORMAL LOW (ref 8.9–10.3)
Chloride: 95 mmol/L — ABNORMAL LOW (ref 98–111)
Creatinine, Ser: 1.16 mg/dL — ABNORMAL HIGH (ref 0.44–1.00)
GFR, Estimated: 59 mL/min — ABNORMAL LOW (ref >60)
Glucose, Bld: 83 mg/dL (ref 70–99)
Potassium: 3.7 mmol/L (ref 3.5–5.1)
Sodium: 134 mmol/L — ABNORMAL LOW (ref 135–145)

## 2023-08-05 LAB — PROTIME-INR
INR: 2.2 — ABNORMAL HIGH (ref 0.8–1.2)
Prothrombin Time: 25.3 s — ABNORMAL HIGH (ref 11.4–15.2)

## 2023-08-05 LAB — MAGNESIUM: Magnesium: 1.7 mg/dL (ref 1.7–2.4)

## 2023-08-05 LAB — PHOSPHORUS: Phosphorus: 3.6 mg/dL (ref 2.5–4.6)

## 2023-08-05 LAB — LACTATE DEHYDROGENASE: LDH: 201 U/L — ABNORMAL HIGH (ref 98–192)

## 2023-08-05 MED ORDER — AMIODARONE HCL 200 MG PO TABS
200.0000 mg | ORAL_TABLET | Freq: Every day | ORAL | Status: DC
  Administered 2023-08-05: 200 mg via ORAL
  Filled 2023-08-05: qty 1

## 2023-08-05 MED ORDER — WARFARIN SODIUM 7.5 MG PO TABS
7.5000 mg | ORAL_TABLET | Freq: Once | ORAL | Status: AC
  Administered 2023-08-05: 7.5 mg via ORAL
  Filled 2023-08-05: qty 1

## 2023-08-05 MED ORDER — PANTOPRAZOLE SODIUM 40 MG PO TBEC
40.0000 mg | DELAYED_RELEASE_TABLET | Freq: Every day | ORAL | 4 refills | Status: DC
  Filled 2023-08-05: qty 90, 90d supply, fill #0

## 2023-08-05 MED ORDER — FE FUM-VIT C-VIT B12-FA 460-60-0.01-1 MG PO CAPS
1.0000 | ORAL_CAPSULE | Freq: Every day | ORAL | 4 refills | Status: DC
  Filled 2023-08-05: qty 30, 30d supply, fill #0

## 2023-08-05 MED ORDER — POTASSIUM CHLORIDE CRYS ER 20 MEQ PO TBCR
20.0000 meq | EXTENDED_RELEASE_TABLET | Freq: Every day | ORAL | 6 refills | Status: DC
  Filled 2023-08-05: qty 100, 100d supply, fill #0

## 2023-08-05 MED ORDER — MAGNESIUM SULFATE 4 GM/100ML IV SOLN
4.0000 g | Freq: Once | INTRAVENOUS | Status: AC
  Administered 2023-08-05: 4 g via INTRAVENOUS
  Filled 2023-08-05: qty 100

## 2023-08-05 MED ORDER — POTASSIUM CHLORIDE CRYS ER 20 MEQ PO TBCR
40.0000 meq | EXTENDED_RELEASE_TABLET | Freq: Once | ORAL | Status: AC
  Administered 2023-08-05: 40 meq via ORAL
  Filled 2023-08-05: qty 2

## 2023-08-05 MED ORDER — OXYCODONE HCL 5 MG PO TABS
5.0000 mg | ORAL_TABLET | Freq: Four times a day (QID) | ORAL | 0 refills | Status: AC | PRN
  Filled 2023-08-05: qty 20, 5d supply, fill #0

## 2023-08-05 MED ORDER — GABAPENTIN 300 MG PO CAPS
300.0000 mg | ORAL_CAPSULE | Freq: Two times a day (BID) | ORAL | 0 refills | Status: DC
  Filled 2023-08-05: qty 60, 30d supply, fill #0

## 2023-08-05 MED ORDER — SPIRONOLACTONE 25 MG PO TABS
25.0000 mg | ORAL_TABLET | Freq: Every day | ORAL | 4 refills | Status: AC
Start: 2023-08-05 — End: ?
  Filled 2023-08-05: qty 100, 100d supply, fill #0

## 2023-08-05 MED ORDER — WARFARIN SODIUM 5 MG PO TABS
ORAL_TABLET | ORAL | 12 refills | Status: DC
  Filled 2023-08-05: qty 36, 36d supply, fill #0

## 2023-08-05 MED ORDER — AMIODARONE HCL 200 MG PO TABS
200.0000 mg | ORAL_TABLET | Freq: Every day | ORAL | 4 refills | Status: DC
  Filled 2023-08-05: qty 90, 90d supply, fill #0

## 2023-08-05 MED ORDER — SENNOSIDES-DOCUSATE SODIUM 8.6-50 MG PO TABS
1.0000 | ORAL_TABLET | Freq: Every day | ORAL | 4 refills | Status: DC
  Filled 2023-08-05: qty 90, 90d supply, fill #0

## 2023-08-05 MED ORDER — TORSEMIDE 20 MG PO TABS
40.0000 mg | ORAL_TABLET | Freq: Every day | ORAL | 4 refills | Status: DC
  Filled 2023-08-05: qty 180, 90d supply, fill #0

## 2023-08-05 NOTE — Progress Notes (Signed)
 Patient ID: [name] [name], female   DOB: 15-Nov-1975, 48 y.o.   MRN: [id number]     Advanced Heart Failure Rounding Note  Cardiologist: Ria Commander, DO  Chief Complaint: Cardiogenic Shock Subjective:    6/17: mTEER with sev posterior leaflet restriction resulting in tear. IABP + PAC. PCWP 40, v up to 60 6/24 : Aggressive diuresed. Started on lasix  drip. Brisk diuresis noted.  6/30: Echo with EF 25-30%, RV moderately dysfunctional/mildly dilated, Mitraclip present with severe MR, mild-moderate TR, dilated IVC.  7/3: s/p HM III LVAD and bioprosthetic MVR 7/4: Extubated 7/7: LVAD speed increased to 5800 7/8: LVAD speed increased to 5900 7/9: Weaned milrinone  to 0.125. Ramp echo >> speed increased to 6000  7/11 Milrinone  stopped yesterday Co-ox dropped to 46%. Milrinone  restarted at 0.125.  7/14: Milrinone  stopped. Co-ox 51%. CO/CI (fick) 5.8/2.4  Co-ox 51%. CO/CI (fick) 5.8/2.4. Off Milrinone .  sCr 0.95>1.16  LVAD Interrogation HM III: Speed: 600 Flow: 4.3 PI: 3.3 Power: 4.4. On batteries, will interrogate once back on monitor.   Objective:    Doppler: 80-100.  Weight Range: 113.4 kg Body mass index is 35.87 kg/m.   Vital Signs:   Temp:  [97.6 F (36.4 C)-99.2 F (37.3 C)] 97.6 F (36.4 C) (07/15 0742) Pulse Rate:  [60-85] 85 (07/15 0742) Resp:  [17-20] 17 (07/15 0742) BP: (83-135)/(63-102) 135/102 (07/15 0450) SpO2:  [87 %-99 %] 99 % (07/15 0450) FiO2 (%):  [21 %] 21 % (07/14 2251) Weight:  [113.4 kg] 113.4 kg (07/15 0450) Last BM Date : 08/04/23  Weight change: Filed Weights   08/03/23 0552 08/04/23 0526 08/05/23 0450  Weight: 112 kg 110.5 kg 113.4 kg   Intake/Output:  Intake/Output Summary (Last 24 hours) at 08/05/2023 0829 Last data filed at 08/05/2023 0534 Gross per 24 hour  Intake 540.26 ml  Output 450 ml  Net 90.26 ml   Physical Exam   General: Well appearing. No distress on RA Cardiac: JVP flat. Mechanical heart sounds with LVAD hum present.   Resp: Lung sounds clear and equal B/L Abdomen: Soft, non-tender, non-distended.  Driveline: Dressing C/D/I. No drainage or redness. Anchor in place. Extremities: Warm and dry. No edema. Neuro: Alert and oriented x3. Affect pleasant. Moves all extremities without difficulty. Lines/Devices: RUE PICC  Labs   CBC Recent Labs    08/04/23 0455 08/05/23 0510  WBC 10.2 9.8  NEUTROABS 8.0* 7.8*  HGB 8.1* 8.7*  HCT 26.6* 28.9*  MCV 89.9 90.3  PLT 271 252   Basic Metabolic Panel Recent Labs    92/85/74 0455 08/05/23 0510  NA 134* 134*  K 4.0 3.7  CL 96* 95*  CO2 31 29  GLUCOSE 82 83  BUN 13 15  CREATININE 0.95 1.16*  CALCIUM  8.5* 8.8*  MG 1.8 1.7  PHOS 3.6 3.6   BNP (last 3 results) Recent Labs    07/04/23 1057 07/25/23 0500 07/31/23 0519  BNP 612.0* 938.1* 856.6*   Medications:    Scheduled Medications:  acetaminophen   500 mg Oral TID   amiodarone   200 mg Oral BID   Chlorhexidine  Gluconate Cloth  6 each Topical Daily   digoxin   0.125 mg Oral Daily   Fe Fum-Vit C-Vit B12-FA  1 capsule Oral QPC breakfast   gabapentin   300 mg Oral BID   melatonin  3 mg Oral QHS   pantoprazole   40 mg Oral Daily   senna-docusate  1 tablet Oral Daily   sodium chloride  flush  10-40 mL Intracatheter Q12H  spironolactone   25 mg Oral Daily   torsemide   40 mg Oral Daily   Warfarin - Pharmacist Dosing Inpatient   Does not apply q1600   Infusions:   PRN Medications: ondansetron  (ZOFRAN ) IV, mouth rinse, oxyCODONE , phenol, sodium chloride  flush, traMADol   Patient Profile   48 y.o. female with history of chronic systolic HF with biventricular dysfunction on home inotrope, severe MR, hx PE/DVT, obesity, CAD, anemia, tobacco use. Presented for mTEER on 07/09/23. Worsening MR leading to cardiogenic shock. S/p LVAD 07/24/23.   Assessment/Plan   Cardiogenic shock s/p HM3 Implantation + MVR 07/24/23, resolved: due to torrential MR s/p mTEER complicated by severely restricted posterior  leaflet that was damaged during repair attempt, IABP and nipride  needed to stabilize. Transitioned to Impella 5.5. Not a transplant candidate with tobacco use.   - 7/3 HM 3 + bioprosthetic MV replacement. Noted to have acute/subacute pericarditis with inflammatory exudate over heart and pericardium  - Ramp echo 7/9 > speed increased to 6000 - VAD interrogated personally, parameters stable - Milrinone  stopped 7/11; Co-ox dropped to 46%. Restarted at 0.125.  - Milrinone  stopped again 7/14. Coox 52. CO/CI as above.  - Euvolemic. Continue torsemide  40 daily  - Continue digoxin  0.125 mg daily - Continue spiro 25 mg daily - HTN. Dopplers 80-100s. Report elevated pressures correlate with pain >7/10, managing with PRN oxy.   HM 3 LVAD 07/24/23 - MAP 80-100s. As above - VAD interrogated personally. Parameters stable. - LDH ok. INR 2.2. Discussed warfarin dosing with pharmD personally - No ASA - Doing well with discharge teaching   Torrential MR - S/p bioprosthetic MVR.    Acute hypoxic respiratory failure - resolved  Hx of PE/DVT w/ IVC filter - Evaluated at [facility] where thrombophilia work-up was negative (Lupus inhibitor, anticardiolipin antibodies, and anti beta-2  glycoprotein antibodies were negative; protein C activity 85%, protein S activity 76%, factor V Leiden, and prothrombin gene mutation ) - IVC filter in place; could not be removed due to significant thrombus burden on filter at [facility]  - Continue warfarin   CAD - LHC in 5/23 w/ nonobstructive CAD involving RCA - no s/s angina  Tobacco use - Smoking 3 cigarettes/day at time of last follow-up.  Anemia  - long h/o IDA, required frequent IV infusions pre-op  - hgb stable - Given IV iron  7/7  PVCs - improved - decrease amio to 200 mg daily  AKI - Resolved  Length of Stay: 87  [name] Hedda Crumbley, NP  08/05/2023, 8:29 AM  Advanced Heart Failure Team Pager [phone] (M-F; 7a - 5p)  Please contact [facility] for night-coverage  after hours (5p -7a ) and weekends on [email or web address]

## 2023-08-05 NOTE — Progress Notes (Signed)
 RUE PICC removed. Site unremarkable. Pressure dressing applied. Pt advised to remain flat in bed until 1650. Keep dressing dry and intact for 24 hours. Report any drainage, swelling or redness at site. She and family voiced understanding. RN aware.

## 2023-08-05 NOTE — TOC Progression Note (Signed)
 Transition of Care [facility]) - Progression Note    Patient Details  Name: [name] [name] MRN: [id number] Date of Birth: 16-Feb-1975  [facility] CM/SW Contact  Lauraine FORBES Saa, LCSW Phone Number: 08/05/2023, 11:35 AM  Clinical Narrative:     11:35 AM Per progressions, patient is expected to discharge home with [facility] today. No CSW needs were identified at this time. CSW will continue to follow and be available to assist.  Expected Discharge Plan: Home w Home Health Services Barriers to Discharge: Continued Medical Work up  Expected Discharge Plan and Services   Discharge Planning Services: CM Consult Post Acute Care Choice: IP Rehab Living arrangements for the past 2 months: Single Family Home                           HH Arranged: RN [facility] Agency: [facility] Date [facility] Agency Contacted: 07/11/23 Time HH Agency Contacted: 1534 Representative spoke with at [facility] Agency: Pam RN   Social Determinants of Health (SDOH) Interventions SDOH Screenings   Food Insecurity: No Food Insecurity (05/20/2023)  Housing: Low Risk  (05/20/2023)  Transportation Needs: No Transportation Needs (05/20/2023)  Utilities: Not At Risk (05/20/2023)  Alcohol  Screen: Low Risk  (04/03/2023)  Depression (PHQ2-9): Low Risk  (05/26/2023)  Financial Resource Strain: Low Risk  (04/03/2023)  Physical Activity: Insufficiently Active (04/03/2023)  Social Connections: Socially Isolated (04/03/2023)  Stress: No Stress Concern Present (04/03/2023)  Tobacco Use: High Risk (07/24/2023)    Readmission Risk Interventions    07/03/2022   12:12 PM 02/13/2022   12:41 PM 12/31/2021    1:52 PM  Readmission Risk Prevention Plan  Transportation Screening Complete Complete   HRI or Home Care Consult Complete Complete Complete  Social Work Consult for Recovery Care Planning/Counseling Complete Complete Complete  Palliative Care Screening Not Applicable Not Applicable Not Applicable  Medication Review Oceanographer)  Complete Complete Complete

## 2023-08-05 NOTE — Progress Notes (Signed)
 PHARMACY - ANTICOAGULATION CONSULT NOTE  Pharmacy Consult for warfarin Indication: LVAD  Allergies  Allergen Reactions   Sulfa  Antibiotics Itching, Swelling and Rash    Lip swelling    Patient Measurements: Height: 5' 10 (177.8 cm) Weight: 113.4 kg (250 lb) IBW/kg (Calculated) : 68.5 HEPARIN  DW (KG): 94.1  Vital Signs: Temp: 97.7 F (36.5 C) (07/15 1144) Temp Source: Oral (07/15 1144) BP: 108/90 (07/15 1144) Pulse Rate: 89 (07/15 1144)  Labs: Recent Labs    08/03/23 0415 08/04/23 0455 08/05/23 0510  HGB 8.3* 8.1* 8.7*  HCT 27.3* 26.6* 28.9*  PLT 343 271 252  LABPROT 26.9* 26.6* 25.3*  INR 2.4* 2.3* 2.2*  CREATININE 0.98 0.95 1.16*    Estimated Creatinine Clearance: 82.8 mL/min (A) (by C-G formula based on SCr of 1.16 mg/dL (H)).   Medical History: Past Medical History:  Diagnosis Date   Anemia    Blood transfusion without reported diagnosis    CAD (coronary artery disease)    Nonobstructive   Closed left ankle fracture    August 30 2012   Clotting disorder (HCC)    DVT (deep venous thrombosis) (HCC)    L leg   Fibroid tumor    HFrEF (heart failure with reduced ejection fraction) (HCC)    Hidradenitis    Hypertension    Mitral regurgitation    Myocardial infarction (HCC)    NICM (nonischemic cardiomyopathy) (HCC)    NSVT (nonsustained ventricular tachycardia) (HCC)    Obesity    OSA on CPAP    Pulmonary embolism (HCC) 04/2013   Sleep apnea     Medications:  Scheduled:   acetaminophen   500 mg Oral TID   amiodarone   200 mg Oral Daily   Chlorhexidine  Gluconate Cloth  6 each Topical Daily   digoxin   0.125 mg Oral Daily   Fe Fum-Vit C-Vit B12-FA  1 capsule Oral QPC breakfast   gabapentin   300 mg Oral BID   melatonin  3 mg Oral QHS   pantoprazole   40 mg Oral Daily   senna-docusate  1 tablet Oral Daily   sodium chloride  flush  10-40 mL Intracatheter Q12H   spironolactone   25 mg Oral Daily   torsemide   40 mg Oral Daily   Warfarin - Pharmacist  Dosing Inpatient   Does not apply q1600   Infusions:     Assessment: 48 yo F who had an LVAD placed on 7/3. PMH significant for HFrEF, severe MR, DVT/PE, pancytopenia. Home warfarin regimen: 10 mg M-F, 5 mg on Sa/Su. Pharmacy has been consulted for warfarin management.  INR is therapeutic at 2.3. Hgb 8.1, plt 271. LDH stable at 217. Patient has been on lower doses of warfarin since LVAD implantation. Eating well (documented at 100%). On amiodarone  since 7/4 (POD1), started digoxin  7/12. Has required 42.5 mg/week recently.  Goal of Therapy:  INR 2.0-2.5 Monitor platelets by anticoagulation protocol: Yes   Plan:  At discharge, planning warfarin 5 mg daily (using 5 mg tablets) except 7.5 mg on Tues/Thurs/Sat Monitor daily INR, CBC, signs/symptoms of bleeding  Thank you for allowing pharmacy to participate in this patient's care,  Harlene Barlow, [employer], [facility] Clinical Pharmacist  08/05/2023 1:36 PM   [facility] pharmacy phone numbers are listed on [email or web address]

## 2023-08-05 NOTE — TOC Transition Note (Addendum)
 Transition of Care [facility]) - Discharge Note   Patient Details  Name: [name] [name] MRN: [id number] Date of Birth: 1975-03-20  Transition of Care [facility]) CM/SW Contact:  Justina Delcia Czar, RN Phone Number: [phone] 08/05/2023, 1:18 PM   Clinical Narrative:    HF TOC CM spoke to pt and her mother will provide transportation home today.  Teachey Lifts has completed her paperwork for [facility] Medicaid PCS services. Her assessment is scheduled for 08/14/2023 to see if she qualifies for a aide in the home.  Contacted [employer] rep, Lynette for HHRN/HHPT for disease management. Updated agency she will not be dc with Milrinone .  Contacted Kimber char, Ryan for a Rollator for home. Pt declined 3n1 bedside commode.   PCP appt scheduled for 08/15/2023 at 10 am  [facility] unable to accept referral due to staffing. [facility] able to accept referral in pt's area.   Final next level of care: Home w Home Health Services Barriers to Discharge: No Barriers Identified   Patient Goals and CMS Choice Patient states their goals for this hospitalization and ongoing recovery are:: just want to feel better CMS Medicare.gov Compare Post Acute Care list provided to:: Patient Choice offered to / list presented to : Patient      Discharge Placement                       Discharge Plan and Services Additional resources added to the After Visit Summary for     Discharge Planning Services: CM Consult Post Acute Care Choice: IP Rehab                    HH Arranged: RN [facility] Agency: [facility] Date [facility] Agency Contacted: 08/05/23 Time HH Agency Contacted: [phone] Representative spoke with at [facility] Agency: Pam RN  Social Drivers of Health (SDOH) Interventions SDOH Screenings   Food Insecurity: No Food Insecurity (05/20/2023)  Housing: Low Risk  (05/20/2023)  Transportation Needs: No Transportation Needs (05/20/2023)  Utilities: Not At Risk (05/20/2023)  Alcohol  Screen: Low Risk  (04/03/2023)  Depression  (PHQ2-9): Low Risk  (05/26/2023)  Financial Resource Strain: Low Risk  (04/03/2023)  Physical Activity: Insufficiently Active (04/03/2023)  Social Connections: Socially Isolated (04/03/2023)  Stress: No Stress Concern Present (04/03/2023)  Tobacco Use: High Risk (07/24/2023)     Readmission Risk Interventions    07/03/2022   12:12 PM 02/13/2022   12:41 PM 12/31/2021    1:52 PM  Readmission Risk Prevention Plan  Transportation Screening Complete Complete   HRI or Home Care Consult Complete Complete Complete  Social Work Consult for Recovery Care Planning/Counseling Complete Complete Complete  Palliative Care Screening Not Applicable Not Applicable Not Applicable  Medication Review Oceanographer) Complete Complete Complete

## 2023-08-05 NOTE — Progress Notes (Signed)
 Cardiac Rehab Phase 1  Reviewed discharge education with Sion including restrictions, IS use, sternal precautions, risk factor modification, and activity progression. Heart healthy nutrition and exercise guidelines handouts given. Will follow-up with patient regarding a nutrition question at a later date.  Patient very receptive and verbalizes understanding of information given. Discussed Phase 2 cardiac rehab, and she is interested in the program at [facility]. Referral sent.   Arnoldo CHRISTELLA Gal, MS, ACSM CEP 08/05/2023 [phone]

## 2023-08-05 NOTE — Progress Notes (Signed)
 Patient provided with verbal discharge instructions. Paper copy of discharge provided to patient. RN answered all questions. VSS at discharge.  removed. Patient belongings sent with patient. Education completed with LVAD coordinator. Equipment and DME brought to room. Patient dc'd via

## 2023-08-05 NOTE — Progress Notes (Signed)
 Mobility Specialist Progress Note;   08/05/23 1014  Mobility  Activity Ambulated independently in hallway  Level of Assistance Modified independent, requires aide device or extra time  Assistive Device None  Distance Ambulated (ft) 400 ft  Activity Response Tolerated well  Mobility Referral Yes  Mobility visit 1 Mobility  Mobility Specialist Start Time (ACUTE ONLY) 1014  Mobility Specialist Stop Time (ACUTE ONLY) 1021  Mobility Specialist Time Calculation (min) (ACUTE ONLY) 7 min   Pt agreeable to mobility. Required no physical assistance during ambulation, ModI. Dyspnea displayed while ambulating, however VSS on RA. Pt deferred any rest breaks. HR up to 104 bpm w/ activity. Pt returned to sitting on EoB, SPO2 99% RA. Left with all needs met.   Lauraine Erm Mobility Specialist Please contact via SecureChat or [employer] [phone]

## 2023-08-05 NOTE — Progress Notes (Addendum)
 Discharge equipment includes:  1. Two system controllers. 2. Mobile Power Unit (MPU) with 20' patient cable 3. One universal Magazine features editor (UBC) 4. Eight fully charged batteries  5. Four battery clips 6. One travel case 7. One holster vest 8. Wearable accessory package 9. Daily dressing kits, anchors, Aquacel (silver dressing)   VAD Education:   1. Reviewed importance of having a 24 hour caregiver 2. Reviewed dressing change frequency 3. Reviewed when to call the VAD pager and made sure they have phone number in their phone.  4. Reviewed importance of changing one power source at a time 5. Reviewed importance of carrying black emergency bag containing backup controller, 2 batteries, and 2 battery clips, everywhere 6. Reviewed importance of placing mobile power unit (MPU) and batteries on bedside table with a flashlight. Talked about what to do in case of power failure. Reminded to make sure the outlets that equipment is plugged into are not controlled by a light switch.  7. Reviewed importance of using anchors to hold drive line in place, to prevent accidental pulling, or dislodgement of drive line.  8. Patient and family agreed to pick up prescriptions. Stressed importance of taking Warfarin daily in the evening. Stressed importance of taking all prescribed medications as written 9. First clinic visit bring all medications and VAD log.   Pt, her mother [name], and her aunt [name] all successfully paged VAD pager today.    [name] RN VAD Coordinator  Office: [phone]  24/7 Pager: [phone]

## 2023-08-05 NOTE — Progress Notes (Signed)
 LVAD Coordinator Rounding Note:  Admitted 07/09/23 post mitral clip with severe residual mitral regurgitation and cardiogenic shock. Impella 5.5 placed 07/10/23 and VAD evaluation initiated.  HM III LVAD implanted on 07/24/23 by Dr. Lucas under destination criteria.  6/17: mTEER with sev posterior leaflet restriction resulting in tear. IABP + PAC. PCWP 40, v up to 60 6/24 : Aggressive diuresed. Started on lasix  drip. Brisk diuresis noted.  6/30: Echo with EF 25-30%, RV moderately dysfunctional/mildly dilated, Mitraclip present with severe MR, mild-moderate TR, dilated IVC.  7/3: s/p HM III LVAD and bioprosthetic MVR 7/4: Extubated 7/7: Speed increased to 5800 7/8: Speed increased to 5900 7/9: Decrease Milrinone  to 0.125 mcg. Speed increased to 6000.  7/14: Stopped Milrinone   Pt sitting up in the chair on my arrival. States she is feeling great today, and is excited to go home.   VAD discharge teaching completed 08/01/23. Please see separate note for details. Ongoing dressing change teaching with pt's cousin NeeNee. Will plan to continue dressing change education in VAD clinic. Plan will be for pt to come to clinic daily for wound care until caregiver is checked off.   Plan for discharge today. Home equipment delivered to bedside. See separate note for details. VAD clinic appt 08/06/23 @ 11 AM for dressing care. Hosp d/c f/u appt 08/12/23 @ 11 AM.   Chest tube sutures and drive line suture intact. Will leave these in place for now to allow for complete wound approximation prior to removal. Will remove in VAD clinic.   Vital signs: Temp: 97.6 HR: 85 Doppler Pressure: not documented Automatic BP: 98/84 (91) O2 Sat: 94% on RA Wt: 250.8>246.9>248.4>247.8>248.9>243.6>250 lbs  LVAD interrogation reveals:  Speed: 6000 Flow: 4.6 Power: 4.8 w PI: 3.5  Alarms: none Events:  none Hematocrit: 27  Fixed speed: 6000 Low speed limit: 5700  Drive Line: Existing VAD dressing removed and site  care performed using sterile technique. Drive line exit site cleaned with Chlora prep applicators x 2, allowed to dry, and gauze dressing with silver strip applied flush to exit site. Exit site healing and unincorporated, the velour is fully implanted at exit site; hub is exposed at site probably due to fluid volume loss. Suture intact. Moderate serousangenous drainage. No redness, tenderness, foul odor or rash noted. Drive line anchor secured. Continue daily dressing changes. Next dressing change 08/06/23 by VAD Coordinator or nurse champion.     Labs:  LDH trend: 476>390>338>301>264>217>201  INR trend: 1.4>1.8>1.7>1.8>2.3>2.3>2.2  Anticoagulation Plan: -INR Goal: 2.0-2.5 -ASA Dose: n/a  Blood Products:  Intra-op: 2 FFP, 1 Platelet, 2 Cryo Post-op:  07/24/23: 1 PRBC 07/25/23:1 PRBC  Device:  Arrythmias:  HX: NSVT on p.o. amiodarone   Respiratory: extubated 07/25/23  Infection:  Renal:  -BUN/CRT: 24/1.15>1.02>24/0.87>23/0.74>15/1.16  Drips:  Milrinone  0.125 mcg/kg/min-- off 7/14  Adverse Events on VAD:  VAD Education: D/c teaching completed 08/01/23 - see separate note for details.  NeeNee unable to come to hospital for dressing change teaching today. Plan for her to perform dressing tomorrow in VAD clinic with VAD coordinator.  Pt, mom [name], and aunt [name] successfully paged VAD pager  Plan/Recommendations:  1. Page VAD coordinator with any VAD alarms or equipment issues. 2. Drive line dressing to be changed daily by VAD Coordinator or Oncologist.  [name] RN VAD Coordinator  Office: [phone]  24/7 Pager: [phone]

## 2023-08-06 ENCOUNTER — Encounter (HOSPITAL_COMMUNITY): Payer: Self-pay | Admitting: *Deleted

## 2023-08-06 ENCOUNTER — Telehealth: Payer: Self-pay | Admitting: *Deleted

## 2023-08-06 ENCOUNTER — Ambulatory Visit (HOSPITAL_COMMUNITY)
Admit: 2023-08-06 | Discharge: 2023-08-06 | Disposition: A | Discharge status: Home / Self Care | Attending: Cardiology | Admitting: Cardiology

## 2023-08-06 DIAGNOSIS — I5020 Unspecified systolic (congestive) heart failure: Secondary | ICD-10-CM

## 2023-08-06 DIAGNOSIS — Z95811 Presence of heart assist device: Secondary | ICD-10-CM | POA: Insufficient documentation

## 2023-08-06 NOTE — Transitions of Care (Post Inpatient/ED Visit) (Signed)
   08/06/2023  Name: [name] [name] MRN: [id number] DOB: 23-Sep-1975  Today's TOC FU Call Status: Today's TOC FU Call Status:: Unsuccessful Call (1st Attempt) Unsuccessful Call (1st Attempt) Date: 08/06/23  Attempted to reach the patient regarding the most recent Inpatient/ED visit.  Follow Up Plan: Additional outreach attempts will be made to reach the patient to complete the Transitions of Care (Post Inpatient/ED visit) call.   Cathlean Headland BSN RN [facility] Care Management Coordinator [email or web address] Direct Dial: [phone]  Fax: [phone] Website: [email or web address]

## 2023-08-06 NOTE — Progress Notes (Signed)
 Pt presents for dressing in VAD clinic alone. No issues with VAD equipment or drive line   Discharged home from hospital yesterday evening. States she had a great first night at home.   Pt's cousin did not come to clinic today for dressing change education. Pt states that NeeNee will not be coming to future appts, and will not be changing her dressing. She would like VAD coordinators to train her mother on dressing change technique. Her mom will plan to come to clinic starting next week for dressing training.   Chest tube sutures removed today. Sites cleansed with betadine prior to removal. Advised her to keep sites clean while healing. She verbalized understanding.   Exit site care: Existing VAD dressing removed and site care performed using sterile technique. Drive line exit site cleaned with Chlora prep applicators x 2, allowed to dry, and gauze dressing with silver strip applied flush to exit site. Exit site healing and partially incorporated, the velour is fully implanted at exit site; hub is exposed at site probably due to fluid volume loss. Suture intact. Moderate serousangenous drainage. No redness, tenderness, foul odor or rash noted. Drive line anchor secured. Provided with 7 daily kits for home use. Advised to bring to clinic for each dressing change.      Plan:  Return to clinic tomorrow for dressing change  [name] RN VAD Coordinator  Office: [phone]  24/7 Pager: [phone]

## 2023-08-07 ENCOUNTER — Ambulatory Visit (HOSPITAL_COMMUNITY)

## 2023-08-07 ENCOUNTER — Other Ambulatory Visit (HOSPITAL_COMMUNITY): Payer: Self-pay | Admitting: *Deleted

## 2023-08-07 ENCOUNTER — Telehealth: Payer: Self-pay

## 2023-08-07 ENCOUNTER — Ambulatory Visit: Admitting: Physician Assistant

## 2023-08-07 ENCOUNTER — Ambulatory Visit (HOSPITAL_COMMUNITY)
Admission: RE | Admit: 2023-08-07 | Discharge: 2023-08-07 | Disposition: A | Discharge status: Home / Self Care | Source: Ambulatory Visit | Attending: Cardiology | Admitting: Cardiology

## 2023-08-07 DIAGNOSIS — Z95811 Presence of heart assist device: Secondary | ICD-10-CM | POA: Insufficient documentation

## 2023-08-07 DIAGNOSIS — Z7901 Long term (current) use of anticoagulants: Secondary | ICD-10-CM

## 2023-08-07 DIAGNOSIS — Z4509 Encounter for adjustment and management of other cardiac device: Secondary | ICD-10-CM | POA: Diagnosis not present

## 2023-08-07 DIAGNOSIS — I5022 Chronic systolic (congestive) heart failure: Secondary | ICD-10-CM

## 2023-08-07 DIAGNOSIS — I5020 Unspecified systolic (congestive) heart failure: Secondary | ICD-10-CM

## 2023-08-07 NOTE — Transitions of Care (Post Inpatient/ED Visit) (Signed)
   08/07/2023  Name: [name] [name] MRN: [id number] DOB: 1975-12-09  Today's TOC FU Call Status: Today's TOC FU Call Status:: Successful TOC FU Call Completed TOC FU Call Complete Date: 08/07/23 Patient's Name and Date of Birth confirmed.  Spoke with patient.  She reports she is doing well.  She states she is heading out for her appointment with the LVAD clinic.  She is going daily for dressing changes and is part of that clinic for follow up care. RN CM closing as patient part of LVAD clinic care team.  Transition Care Management Follow-up Telephone Call Date of Discharge: 08/05/23 Discharge Facility: [name]) Type of Discharge: Inpatient Admission Primary Inpatient Discharge Diagnosis:: Severe mitral regurgitation How have you been since you were released from the hospital?: Better Any questions or concerns?: No  Items Reviewed: Did you receive and understand the discharge instructions provided?: Yes  Medications Reviewed Today: Not reviewed Medications Reviewed Today   Medications were not reviewed in this encounter     Home Care and Equipment/Supplies:    Functional Questionnaire:    Follow up appointments reviewed: PCP Follow-up appointment confirmed?: Yes Date of PCP follow-up appointment?: 08/15/23 Follow-up Provider: [facility] Follow-up appointment confirmed?: Yes Date of Specialist follow-up appointment?: 08/07/23 Follow-Up Specialty Provider:: LVAD clinic Do you need transportation to your follow-up appointment?: No  Akela Pocius DOROTHA Seeds RN, MSN [facility]  [facility], [facility] RN Care Manager Direct Dial: [phone]  Fax: [phone] Website: [email or web address]

## 2023-08-07 NOTE — Progress Notes (Signed)
 Pt presents for dressing in VAD clinic with her cousin NeeNee. No issues with VAD equipment or drive line   Exit site care: Existing VAD dressing removed and site care performed using sterile technique by cousin NeeNee. Drive line exit site cleaned with Chlora prep applicators x 2, allowed to dry, and gauze dressing with silver strip applied flush to exit site. Exit site healing and partially incorporated, the velour is fully implanted at exit site; hub is exposed at site probably due to fluid volume loss. Suture intact. Moderate serousangenous drainage. No redness, tenderness, foul odor or rash noted. Drive line anchor secured.        Plan:  Return to clinic tomorrow for dressing change  [name] Lunger RN, BSN VAD Coordinator 24/7 Pager [phone]

## 2023-08-08 ENCOUNTER — Telehealth (HOSPITAL_COMMUNITY): Payer: Self-pay | Admitting: *Deleted

## 2023-08-08 ENCOUNTER — Ambulatory Visit (HOSPITAL_COMMUNITY)
Admission: RE | Admit: 2023-08-08 | Discharge: 2023-08-08 | Disposition: A | Discharge status: Home / Self Care | Source: Ambulatory Visit | Attending: Cardiology | Admitting: Cardiology

## 2023-08-08 DIAGNOSIS — Z4801 Encounter for change or removal of surgical wound dressing: Secondary | ICD-10-CM | POA: Insufficient documentation

## 2023-08-08 DIAGNOSIS — Z95811 Presence of heart assist device: Secondary | ICD-10-CM | POA: Insufficient documentation

## 2023-08-08 MED ORDER — ACETAMINOPHEN 500 MG PO TABS: 500.0000 mg | ORAL_TABLET | Freq: Four times a day (QID) | ORAL | 0 refills | Status: AC | PRN

## 2023-08-08 NOTE — Progress Notes (Signed)
 Pt presents for dressing in VAD clinic with her mom [name]. No issues with VAD equipment or drive line   Exit site care: Existing VAD dressing removed and site care performed using sterile technique. Drive line exit site cleaned with Chlora prep applicators x 2, allowed to dry, and gauze dressing with silver strip applied flush to exit site. Exit site healing and partially incorporated, the velour is fully implanted at exit site; hub is exposed at site probably due to fluid volume loss. Suture intact. Moderate serousangenous drainage. No redness, tenderness, foul odor or rash noted. Drive line anchor secured.         Plan:  Return to clinic Monday for dressing change  [name] Lunger RN, BSN VAD Coordinator 24/7 Pager [phone]

## 2023-08-08 NOTE — Telephone Encounter (Signed)
 Patient called to report [name] [name] has not received disability paperwork. It was sent and confirmed by our office on 08/07/23. Fax # confirmed and re-faxed and confirmed today.

## 2023-08-11 ENCOUNTER — Other Ambulatory Visit (HOSPITAL_COMMUNITY): Payer: Self-pay | Admitting: *Deleted

## 2023-08-11 ENCOUNTER — Ambulatory Visit (HOSPITAL_COMMUNITY): Payer: Self-pay | Admitting: Pharmacist

## 2023-08-11 ENCOUNTER — Ambulatory Visit (HOSPITAL_COMMUNITY): Payer: Self-pay | Admitting: Cardiology

## 2023-08-11 ENCOUNTER — Ambulatory Visit (HOSPITAL_COMMUNITY)
Admission: RE | Admit: 2023-08-11 | Discharge: 2023-08-11 | Disposition: A | Discharge status: Home / Self Care | Source: Ambulatory Visit | Attending: Cardiology | Admitting: Cardiology

## 2023-08-11 VITALS — BP 128/99 | HR 81 | Wt 248.6 lb

## 2023-08-11 DIAGNOSIS — Z6835 Body mass index (BMI) 35.0-35.9, adult: Secondary | ICD-10-CM | POA: Insufficient documentation

## 2023-08-11 DIAGNOSIS — Z952 Presence of prosthetic heart valve: Secondary | ICD-10-CM | POA: Diagnosis not present

## 2023-08-11 DIAGNOSIS — I10 Essential (primary) hypertension: Secondary | ICD-10-CM | POA: Diagnosis not present

## 2023-08-11 DIAGNOSIS — Z4509 Encounter for adjustment and management of other cardiac device: Secondary | ICD-10-CM | POA: Insufficient documentation

## 2023-08-11 DIAGNOSIS — D61818 Other pancytopenia: Secondary | ICD-10-CM | POA: Diagnosis not present

## 2023-08-11 DIAGNOSIS — Z95811 Presence of heart assist device: Secondary | ICD-10-CM | POA: Diagnosis not present

## 2023-08-11 DIAGNOSIS — D649 Anemia, unspecified: Secondary | ICD-10-CM | POA: Diagnosis not present

## 2023-08-11 DIAGNOSIS — Z86718 Personal history of other venous thrombosis and embolism: Secondary | ICD-10-CM | POA: Insufficient documentation

## 2023-08-11 DIAGNOSIS — I493 Ventricular premature depolarization: Secondary | ICD-10-CM | POA: Insufficient documentation

## 2023-08-11 DIAGNOSIS — Z7901 Long term (current) use of anticoagulants: Secondary | ICD-10-CM | POA: Insufficient documentation

## 2023-08-11 DIAGNOSIS — Z79899 Other long term (current) drug therapy: Secondary | ICD-10-CM | POA: Insufficient documentation

## 2023-08-11 DIAGNOSIS — I5022 Chronic systolic (congestive) heart failure: Secondary | ICD-10-CM

## 2023-08-11 DIAGNOSIS — I428 Other cardiomyopathies: Secondary | ICD-10-CM | POA: Insufficient documentation

## 2023-08-11 DIAGNOSIS — Z86711 Personal history of pulmonary embolism: Secondary | ICD-10-CM | POA: Insufficient documentation

## 2023-08-11 DIAGNOSIS — I2510 Atherosclerotic heart disease of native coronary artery without angina pectoris: Secondary | ICD-10-CM | POA: Insufficient documentation

## 2023-08-11 DIAGNOSIS — Z87891 Personal history of nicotine dependence: Secondary | ICD-10-CM | POA: Diagnosis not present

## 2023-08-11 DIAGNOSIS — I5020 Unspecified systolic (congestive) heart failure: Secondary | ICD-10-CM

## 2023-08-11 LAB — BASIC METABOLIC PANEL WITH GFR
Anion gap: 11 (ref 5–15)
BUN: 16 mg/dL (ref 6–20)
CO2: 25 mmol/L (ref 22–32)
Calcium: 9.1 mg/dL (ref 8.9–10.3)
Chloride: 100 mmol/L (ref 98–111)
Creatinine, Ser: 1.04 mg/dL — ABNORMAL HIGH (ref 0.44–1.00)
GFR, Estimated: >60 mL/min (ref >60)
Glucose, Bld: 105 mg/dL — ABNORMAL HIGH (ref 70–99)
Potassium: 3.9 mmol/L (ref 3.5–5.1)
Sodium: 136 mmol/L (ref 135–145)

## 2023-08-11 LAB — CBC
HCT: 34.4 % — ABNORMAL LOW (ref 36.0–46.0)
Hemoglobin: 10.3 g/dL — ABNORMAL LOW (ref 12.0–15.0)
MCH: 26.9 pg (ref 26.0–34.0)
MCHC: 29.9 g/dL — ABNORMAL LOW (ref 30.0–36.0)
MCV: 89.8 fL (ref 80.0–100.0)
Platelets: 226 K/uL (ref 150–400)
RBC: 3.83 MIL/uL — ABNORMAL LOW (ref 3.87–5.11)
RDW: 19.5 % — ABNORMAL HIGH (ref 11.5–15.5)
WBC: 6 K/uL (ref 4.0–10.5)
nRBC: 0 % (ref 0.0–0.2)

## 2023-08-11 LAB — MAGNESIUM: Magnesium: 2.2 mg/dL (ref 1.7–2.4)

## 2023-08-11 LAB — PROTIME-INR
INR: 2 — ABNORMAL HIGH (ref 0.8–1.2)
Prothrombin Time: 23.6 s — ABNORMAL HIGH (ref 11.4–15.2)

## 2023-08-11 LAB — LACTATE DEHYDROGENASE: LDH: 189 U/L (ref 98–192)

## 2023-08-11 LAB — DIGOXIN LEVEL: Digoxin Level: <0.4 ng/mL — ABNORMAL LOW (ref 0.8–2.0)

## 2023-08-11 MED ORDER — LOSARTAN POTASSIUM 25 MG PO TABS: 25.0000 mg | ORAL_TABLET | Freq: Every day | ORAL | 3 refills | Status: DC

## 2023-08-11 MED ORDER — TRAMADOL HCL 50 MG PO TABS: 50.0000 mg | ORAL_TABLET | Freq: Four times a day (QID) | ORAL | 2 refills | Status: DC | PRN

## 2023-08-11 NOTE — Patient Instructions (Signed)
 Start Losartan  25 mg daily Coumadin  dosing per Tinnie Redman PharmD Return to clinic in 1 week for full visit with Dr Zenaida Return to clinic on Wednesday and Friday for dressing change

## 2023-08-11 NOTE — Progress Notes (Signed)
 Patient presents for hospital d/c f/u in VAD Clinic today with her mom [name]. Reports no problems with VAD equipment or concerns with drive line.  Reports she has been feeling great since discharge. Denies lightheadedness, dizziness, falls, swelling, or signs of bleeding. Reports some shortness of breath when walking long distances, but is able to quickly recover with rest.  Experiencing intermittent surgical pain. Has been taking PRN Oxy with pain relief. Has a few tablets left. Per Dr Rolan may have PRN Tramadol - prescription sent to pt's pharmacy.   BP elevated today. Per Dr Rolan will start Losartan  25 mg daily. Prescription sent to pt's pharmacy. Advised pt if she experiences lightheadedness, dizziness, or feels poorly with starting new medication to notify VAD coordinators. She verbalized understanding.      Vital Signs:  Doppler Pressure: 100 Automatc BP: 128/99 (108) HR: 81 SPO2: 100%   Weight: 248.6 lb w/o eqt Last weight: 250 lb  Home weights:  lbs BMI today 35.6 today   VAD Indication: Destination Therapy- smoking   HM3 assessment:   Speed: 6000 rpms Flow: 4.5 Power: 4.9 w    PI: 3.2  Alarms: 1 no external power 7/16, and a few low voltages Events: none  Fixed speed: 6000 Low speed limit: 5700  Primary controller: back up battery due for replacement in 34 months Secondary controller:  back up battery due for replacement in 34 months    I reviewed the LVAD parameters from today and compared the results to the patient's prior recorded data. LVAD interrogation was NEGATIVE for significant power changes, NEGATIVE for clinical alarms and STABLE for PI events/speed drops. No programming changes were made and pump is functioning within specified parameters. Pt is performing daily controller and system monitor self tests along with completing weekly and monthly maintenance for LVAD equipment.   LVAD equipment check completed and is in good working order. Back-up  equipment present. Charged back up battery and performed self-test on equipment.    Annual Equipment Maintenance on UBC/PM was performed on 08/05/23.    Exit Site Care: Existing VAD dressing removed and site care performed using sterile technique. Drive line exit site cleaned with Chlora prep applicators x 2, allowed to dry, and gauze dressing with silver strip applied flush to exit site. Exit site healing and partially incorporated, the velour is fully implanted at exit site; hub is exposed at site probably due to fluid volume loss. Suture x 2 intact- distal suture removed today. Moderate amount of serousangenous drainage. No redness, tenderness, foul odor or rash noted. Drive line anchor reapplied. Advanced to every other day dressing changes in VAD clinic.       Significant Events on VAD Support:       BP & Labs:  MAP 100 - Doppler is reflecting MAP   Hgb 10.3 - No S/S of bleeding. Specifically denies melena/BRBPR or nosebleeds.   LDH stable at 189 with established baseline of 189- 476. Denies tea-colored urine. No power elevations noted on interrogation.    Plan:  Start Losartan  25 mg daily Coumadin  dosing per Tinnie Redman PharmD Return to clinic in 1 week for full visit with Dr Zenaida Return to clinic on Wednesday and Friday for dressing change  [name] RN VAD Coordinator  Office: [phone]  24/7 Pager: [phone]     ADVANCED HEART FAILURE CLINIC NOTE  Primary Care: [facility], [place], [place] Primary Cardiologist: Dorn Ross, MD HF Cardiologist: Dr. Gardenia EP: Eulas Furbish, MD   HPI: ERNESTA TRABERT is a  48 y.o. female with hx of saddle PE/DVT s/p IVC filter (2015), HFrEF/NICM, pancytopenia, nonobstructive CAD, morbid obesity, noncompliance, pancytopenia (followed by hematology), tobacco use.  In 5/23 she was admitted with acute hypoxic respiratory failure requiring intubation. LHC during that admit w/ nonobstructive CAD. She was admitted again in  9/23 with syncope, nausea and C2 fracture managed conservatively. LVEF during that admit noted to be 35% w/ severe posterior MR.   Admitted with acute on chronic CHF in 06/24. EF was down to 25-30%.   Admitted to [facility] ICU in 04/25 with cardiogenic shock. Transferred to [facility] with worsening hypotension and increased pressor requirements.  Echo 4/25 EF 20-25% severe RV dysfunction with septal flattening mod-severe MR. Required inotrope/pressor support in addition to lasix  gtt, diamox  and metolazone  to fully diurese. Initially on DBA but later transitioned to milrinone  given severely elevated PA pressures on RHC. She was discharged with home inotrope with plan to workup for advanced therapies as an ouptatient if compliance improved. Discharge weight 244 lbs.  She continued to struggle with NYHA IV symptoms due to severe MR. She presented for mTEER on 07/09/23. 1 clip placed but procedure aborted d/t worsening MR and shock, MV leaflet was torn. IABP placed and she was stabilized with inotrope support and Nipride . Case discussed in [facility]B and she was felt to be a reasonable candidate for LVAD. She subsequently underwent Impella 5.5 placement followed by HM III VAD and bioprosthetic mitral valve replacement by Dr. Lucas on 07/24/23. She did well post-operatively. RAMP echo performed, speed increased to 6000. Milrinone  weaned off 7/11, however restarted briefly for an initial drop in coox. Wean successfully from Milrinone  on 7/14.    She returns today for LVAD followup.  She is getting help from her mother at home.  Dyspnea/fatigue after walking about 200 feet but gradually improving. Able to do all ADLs.  No lightheadedness. No LVAD alarms. MAP elevated today at 108. Having some pocket site pain.   LVAD Interrogation HM 3 (personally reviewed): Speed: 6000 rpm Flow: 4.5 PI: 3.2 Power: 4.9. No PI events   Labs (7/25): K 3.7, creatinine 1.16, hgb 8.7    Current Outpatient Medications  Medication Sig Dispense  Refill   acetaminophen  (TYLENOL ) 500 MG tablet Take 1 tablet (500 mg total) by mouth every 6 (six) hours as needed. 30 tablet 0   albuterol  (VENTOLIN  HFA) 108 (90 Base) MCG/ACT inhaler Inhale 2 puffs into the lungs every 2 (two) hours as needed for wheezing or shortness of breath. 18 g 11   amiodarone  (PACERONE ) 200 MG tablet Take 1 tablet (200 mg total) by mouth daily. 90 tablet 4   atorvastatin  (LIPITOR) 20 MG tablet TAKE ONE TABLET BY MOUTH ONCE DAILY. 90 tablet 3   benzonatate  (TESSALON ) 100 MG capsule Take 1 capsule (100 mg total) by mouth 3 (three) times daily as needed for cough. Do not take with alcohol  or while operating or driving heavy machinery 21 capsule 0   digoxin  (LANOXIN ) 0.125 MG tablet Take 1 tablet (0.125 mg total) by mouth daily. 90 tablet 4   Fe Fum-Vit C-Vit B12-FA (TRIGELS-F FORTE) CAPS capsule Take 1 capsule by mouth daily after breakfast. 90 capsule 4   gabapentin  (NEURONTIN ) 300 MG capsule Take 1 capsule (300 mg total) by mouth 2 (two) times daily. 60 capsule 0   losartan  (COZAAR ) 25 MG tablet Take 1 tablet (25 mg total) by mouth daily. 90 tablet 3   pantoprazole  (PROTONIX ) 40 MG tablet Take 1 tablet (40 mg total) by  mouth daily. 90 tablet 4   potassium chloride  SA (KLOR-CON  M) 20 MEQ tablet Take 1 tablet (20 mEq total) by mouth daily. 120 tablet 6   senna-docusate (SENOKOT-S) 8.6-50 MG tablet Take 1 tablet by mouth daily. 90 tablet 4   spironolactone  (ALDACTONE ) 25 MG tablet Take 1 tablet (25 mg total) by mouth daily. 180 tablet 4   torsemide  (DEMADEX ) 20 MG tablet Take 2 tablets (40 mg total) by mouth daily. 180 tablet 4   traMADol  (ULTRAM ) 50 MG tablet Take 1-2 tablets (50-100 mg total) by mouth every 6 (six) hours as needed for moderate pain (pain score 4-6) or severe pain (pain score 7-10). 30 tablet 2   warfarin (COUMADIN ) 5 MG tablet Take 7.5 mg (1.5 tablets) by mouth daily on Tuesdays, Thursdays, and Saturdays, then take 5 mg (1 tablet) by mouth on all other days.  36 tablet 12   No current facility-administered medications for this encounter.     PHYSICAL EXAM: Vitals:   08/11/23 1541  BP: (!) 128/99  Pulse: 81  SpO2: 100%  MAP 108  General: Well appearing this am. NAD.  HEENT: Normal. Neck: Supple, JVP 7-8 cm. Carotids OK.  Cardiac:  Mechanical heart sounds with LVAD hum present.  Lungs:  Mild crackles at bases.   Abdomen:  NT, ND, no HSM. No bruits or masses. +BS  LVAD exit site: Well-healed and incorporated. Dressing dry and intact. No erythema or drainage. Stabilization device present and accurately applied. Driveline dressing changed daily per sterile technique. Extremities:  Warm and dry. No cyanosis, clubbing, rash, or edema.  Neuro:  Alert & oriented x 3. Cranial nerves grossly intact. Moves all 4 extremities w/o difficulty. Affect pleasant     Wt Readings from Last 3 Encounters:  08/11/23 112.8 kg (248 lb 9.6 oz)  08/05/23 113.4 kg (250 lb)  06/30/23 110.9 kg (244 lb 6.4 oz)    DATA REVIEW  ECHO: 09/24/21: LVEF 30-35%, RV function mildly reduced, severe posterior MR 8/23: LVEF 30-35%, LVID 7.2cm w/ grade III DD, moderate to severe MR. Severely dilated LA.  09/03/17: LVEF 60%-65%.  TEE 2/24: severe eccentric MR, moderately reduced LV function.  07/03/22: LVEF 25-30% 05/07/23: LVEF 25-30%, grade III DD, RV moderately reduced, moderate posterior MR 05/16/23: LVEF 25-30%, RV moderately reduced, moderate to severe MR   CATH: 06/19/21: 40 to 50% proximal right coronary narrowing. Right dominant anatomy Coronary arteries otherwise normal Significant elevation LVEDP 30 mmHg consistent with acute on chronic combined systolic and diastolic heart failure Mild pulmonary hypertension, mean pressure 30 mmHg.  WHO group II etiology based on hemodysnamics.. Capillary wedge mean 24 mmHg.  V wave to 40 mmHg suggesting some degree of mitral regurgitation. Cardiac output 8.5 L/min with index 3.57 L/min/m. Pulmonary resistance 0.7 Wood  units.  RHC on 7.5 DBA 05/13/23: HEMODYNAMICS: RA:       19 mmHg (mean) RV:       90/9, 19 mmHg PA:       90/38 mmHg (55 mean) PCWP: 27 mmHg (mean) with v waves to 45                                      Estimated Fick CO/CI   6.6L/min, 2.74L/min/m2         Thermodilution CO/CI   6.4L/min, 2.66L/min/m2  TPG  28  mmHg                                               PVR  4.375 Wood Units  PAPi  2.73    ASSESSMENT & PLAN:  Chronic systolic CHF>>>now S/P HM III LVAD 07/24/23: due to torrential MR s/p mTEER complicated by severely restricted posterior leaflet that was damaged during repair attempt in 6/25, IABP and nipride  needed to stabilize. Transitioned to Impella 5.5. Not a transplant candidate with tobacco use.  07/24/23 had HM 3 LVAD + bioprosthetic MV replacement.  Ramp echo 07/30/23 > LVAD speed increased to 6000.  Doing well since discharge from hospital.  Personally reviewed LVAD parameters, stable with no alarms.  NYHA class II symptoms.  MAP elevated. Having some pocket site pain. She is not volume overloaded on exam.  - Continue digoxin  0.125, check level today.  - Continue spironolactone  25 mg daily.  - Add losartan  25 daily with elevated MAP, BMET today and again in 10 days.  - Continue warfarin with goal INR 2-2.5.  - Continue torsemide  40 mg daily + KCl 20 daily.     Torrential MR - S/p bioprosthetic MV replacement.  Stable valve on last post-op echo.    Hx of PE/DVT w/ IVC filter - Evaluated at [facility] where thrombophilia work-up was negative (Lupus inhibitor, anticardiolipin antibodies, and anti beta-2  glycoprotein antibodies were negative; protein C activity 85%, protein S activity 76%, factor V Leiden, and prothrombin gene mutation ) - IVC filter in place; could not be removed due to significant thrombus burden on filter at [facility]  - Continue Warfarin as above.    CAD - LHC in 5/23 w/ nonobstructive CAD involving RCA - She is on atorvastatin .     Tobacco use - No smoking since discharge.    Anemia  - long h/o IDA, required frequent IV infusions pre-op  - Check CBC today.    PVCs - She is on amiodarone  200 mg daily.  Check LFTs/TSH.  - Consider discontinuation at followup.   She will return to LVAD clinic in 1 week.   I spent 41 minutes reviewing records, interviewing/examining patient, and managing orders.   Ezra Shuck 08/12/2023

## 2023-08-12 ENCOUNTER — Encounter (HOSPITAL_COMMUNITY): Admitting: Cardiology

## 2023-08-13 ENCOUNTER — Ambulatory Visit (HOSPITAL_COMMUNITY)
Admission: RE | Admit: 2023-08-13 | Discharge: 2023-08-13 | Disposition: A | Discharge status: Home / Self Care | Source: Ambulatory Visit | Attending: Cardiology | Admitting: Cardiology

## 2023-08-13 DIAGNOSIS — Z4801 Encounter for change or removal of surgical wound dressing: Secondary | ICD-10-CM | POA: Diagnosis not present

## 2023-08-13 DIAGNOSIS — Z95811 Presence of heart assist device: Secondary | ICD-10-CM | POA: Diagnosis not present

## 2023-08-13 NOTE — Progress Notes (Signed)
 Pt presents for dressing in VAD clinic with her cousin NeeNee. No issues with VAD equipment or drive line   Exit site care: Existing VAD dressing removed and site care performed using sterile technique. Drive line exit site cleaned with Chlora prep applicators x 2, allowed to dry, and gauze dressing with silver strip applied flush to exit site. Exit site healing and partially incorporated, the velour is fully implanted at exit site; hub is exposed at site probably due to fluid volume loss. Suture intact. Small amount of serousangenous drainage. No redness, tenderness, foul odor or rash noted. Cath grip anchor reapplied.        Plan:  Return to clinic Friday for dressing change  [name] RN VAD Coordinator  Office: [phone]  24/7 Pager: [phone]

## 2023-08-15 ENCOUNTER — Ambulatory Visit: Admitting: Physician Assistant

## 2023-08-15 ENCOUNTER — Encounter: Payer: Self-pay | Admitting: Physician Assistant

## 2023-08-15 ENCOUNTER — Ambulatory Visit (HOSPITAL_COMMUNITY)
Admission: RE | Admit: 2023-08-15 | Discharge: 2023-08-15 | Disposition: A | Discharge status: Home / Self Care | Source: Ambulatory Visit | Attending: Cardiology | Admitting: Cardiology

## 2023-08-15 ENCOUNTER — Other Ambulatory Visit (HOSPITAL_COMMUNITY): Payer: Self-pay

## 2023-08-15 VITALS — HR 83 | Temp 98.6°F | Ht 70.0 in | Wt 238.6 lb

## 2023-08-15 DIAGNOSIS — Z95811 Presence of heart assist device: Secondary | ICD-10-CM | POA: Insufficient documentation

## 2023-08-15 DIAGNOSIS — I10 Essential (primary) hypertension: Secondary | ICD-10-CM | POA: Diagnosis not present

## 2023-08-15 DIAGNOSIS — I340 Nonrheumatic mitral (valve) insufficiency: Secondary | ICD-10-CM

## 2023-08-15 DIAGNOSIS — F17200 Nicotine dependence, unspecified, uncomplicated: Secondary | ICD-10-CM

## 2023-08-15 DIAGNOSIS — Z7901 Long term (current) use of anticoagulants: Secondary | ICD-10-CM

## 2023-08-15 DIAGNOSIS — Z4801 Encounter for change or removal of surgical wound dressing: Secondary | ICD-10-CM | POA: Diagnosis not present

## 2023-08-15 MED ORDER — NICOTINE 7 MG/24HR TD PT24: 7.0000 mg | MEDICATED_PATCH | Freq: Every day | TRANSDERMAL | 0 refills | Status: DC

## 2023-08-15 MED ORDER — OXYCODONE-ACETAMINOPHEN 5-325 MG PO TABS: 1.0000 | ORAL_TABLET | Freq: Four times a day (QID) | ORAL | 0 refills | Status: AC | PRN

## 2023-08-15 NOTE — Assessment & Plan Note (Signed)
 Unable to obtain accurate blood pressure reading today due to LVAD. Patient appears well, denies chest pain, shortness of breath, headaches, visual changes, dizziness, or LOC. Advised compliance with VAD clinic appointment.

## 2023-08-15 NOTE — Progress Notes (Signed)
 Established Patient Office Visit  Subjective   Patient ID: [name] [name], female    DOB: Feb 04, 1975  Age: 48 y.o. MRN: [id number]  No chief complaint on file.   Patient presents today for hospital follow up following mitral valve replacement and LVAD placement. She was admitted 6/18 to 7/15. Current anticoagulated on Warfarin. Has schedueld follow up with VAD clinic and cardiology. Patient reports feeling well today. She denies chest pain, shortness of breath, headache, visual changes, dizziness, or LOC. She does endorse continue post surgical pain. Discharged on oxycodone  5 mg, and previously prescribed tramadol , however reports tramadol  has not been effective for pain. Patient reports she has been working on smoking cessation and has been doing well since hospital discharge.      Review of Systems  Constitutional:  Negative for chills, fever and malaise/fatigue.  Eyes:  Negative for blurred vision and double vision.  Respiratory:  Negative for cough and shortness of breath.   Cardiovascular:  Negative for chest pain, palpitations and leg swelling.  Neurological:  Negative for dizziness, loss of consciousness and headaches.      Objective:     Pulse 83   Temp 98.6 F (37 C)   Ht 5' 10 (1.778 m)   Wt 238 lb 9.6 oz (108.2 kg)   SpO2 100%   BMI 34.24 kg/m    Physical Exam Constitutional:      Appearance: Normal appearance. She is obese.  HENT:     Head: Normocephalic and atraumatic.     Mouth/Throat:     Mouth: Mucous membranes are moist.     Pharynx: Oropharynx is clear.  Eyes:     Extraocular Movements: Extraocular movements intact.     Conjunctiva/sclera: Conjunctivae normal.  Cardiovascular:     Rate and Rhythm: Normal rate and regular rhythm.     Heart sounds: Normal heart sounds. No murmur heard.    Comments: Mechanical heart sounds present. LVAD in place.  Pulmonary:     Effort: Pulmonary effort is normal.     Breath sounds: Normal breath sounds. No  wheezing, rhonchi or rales.  Musculoskeletal:     Right lower leg: No edema.     Left lower leg: No edema.  Skin:    General: Skin is warm and dry.  Neurological:     General: No focal deficit present.     Mental Status: She is alert and oriented to person, place, and time.  Psychiatric:        Mood and Affect: Mood normal.        Behavior: Behavior normal.      No results found for any visits on 08/15/23.  The ASCVD Risk score (Arnett DK, et al., 2019) failed to calculate for the following reasons:   Risk score cannot be calculated because patient has a medical history suggesting prior/existing ASCVD    Assessment & Plan:   Return in about 4 months (around 12/16/2023) for physical with Pap .   LVAD (left ventricular assist device) present [facility]) Assessment & Plan: Patient appears well today. Has scheduled follow up with VAD clinic today and cardiology on Monday. Oxycodone -acetaminophen  prescribed for post surgical pain, as tramadol  as been ineffective. Patient advised to take sparingly for pain level 9 or 10. PDMP reviewed. Advised close follow up with VAD clinic and cardiology.   Orders: -     oxyCODONE -Acetaminophen ; Take 1 tablet by mouth every 6 (six) hours as needed for up to 5 days for severe pain (pain score  7-10).  Dispense: 20 tablet; Refill: 0  Tobacco use disorder Assessment & Plan: Patient continues to work on smoking cessation. Since hospital discharge has only smoked half of a cigarette. Offered nicotine  patched for patient today. Discussed benefits associated with smoking cessation.   Orders: -     Nicotine ; Place 1 patch (7 mg total) onto the skin daily.  Dispense: 28 patch; Refill: 0  Essential hypertension Assessment & Plan: Unable to obtain accurate blood pressure reading today due to LVAD. Patient appears well, denies chest pain, shortness of breath, headaches, visual changes, dizziness, or LOC. Advised compliance with VAD clinic appointment.       Charmaine Yasuko Lapage, PA-C

## 2023-08-15 NOTE — Assessment & Plan Note (Signed)
 Patient continues to work on smoking cessation. Since hospital discharge has only smoked half of a cigarette. Offered nicotine  patched for patient today. Discussed benefits associated with smoking cessation.

## 2023-08-15 NOTE — Progress Notes (Signed)
 Pt presents for dressing in VAD clinic with her cousin NeeNee. No issues with VAD equipment or drive line   Exit site care: Existing VAD dressing removed and site care performed using sterile technique. Drive line exit site cleaned with Chlora prep applicators x 2, allowed to dry, and gauze dressing with silver strip applied flush to exit site. Exit site healing and partially incorporated, the velour is fully implanted at exit site; hub is exposed at site probably due to fluid volume loss. Suture intact. Small amount of serousangenous drainage. No redness, tenderness, foul odor or rash noted. Cath grip anchor reapplied.         Plan:  Return to clinic Monday for full visit with Dr. Zenaida [name] Lunger RN, BSN VAD Coordinator 24/7 Pager [phone]

## 2023-08-15 NOTE — Assessment & Plan Note (Signed)
 Patient appears well today. Has scheduled follow up with VAD clinic today and cardiology on Monday. Oxycodone -acetaminophen  prescribed for post surgical pain, as tramadol  as been ineffective. Patient advised to take sparingly for pain level 9 or 10. PDMP reviewed. Advised close follow up with VAD clinic and cardiology.

## 2023-08-18 ENCOUNTER — Ambulatory Visit (HOSPITAL_COMMUNITY)
Admission: RE | Admit: 2023-08-18 | Discharge: 2023-08-18 | Disposition: A | Discharge status: Home / Self Care | Source: Ambulatory Visit | Attending: Cardiology | Admitting: Cardiology

## 2023-08-18 ENCOUNTER — Ambulatory Visit (HOSPITAL_COMMUNITY): Payer: Self-pay | Admitting: Pharmacist

## 2023-08-18 DIAGNOSIS — Z4509 Encounter for adjustment and management of other cardiac device: Secondary | ICD-10-CM | POA: Diagnosis not present

## 2023-08-18 DIAGNOSIS — Z6833 Body mass index (BMI) 33.0-33.9, adult: Secondary | ICD-10-CM | POA: Insufficient documentation

## 2023-08-18 DIAGNOSIS — Z7901 Long term (current) use of anticoagulants: Secondary | ICD-10-CM

## 2023-08-18 DIAGNOSIS — T8203XA Leakage of heart valve prosthesis, initial encounter: Secondary | ICD-10-CM | POA: Diagnosis not present

## 2023-08-18 DIAGNOSIS — Z86718 Personal history of other venous thrombosis and embolism: Secondary | ICD-10-CM | POA: Insufficient documentation

## 2023-08-18 DIAGNOSIS — F17200 Nicotine dependence, unspecified, uncomplicated: Secondary | ICD-10-CM | POA: Diagnosis not present

## 2023-08-18 DIAGNOSIS — D61818 Other pancytopenia: Secondary | ICD-10-CM | POA: Diagnosis not present

## 2023-08-18 DIAGNOSIS — I428 Other cardiomyopathies: Secondary | ICD-10-CM | POA: Insufficient documentation

## 2023-08-18 DIAGNOSIS — I5022 Chronic systolic (congestive) heart failure: Secondary | ICD-10-CM | POA: Diagnosis not present

## 2023-08-18 DIAGNOSIS — I5082 Biventricular heart failure: Secondary | ICD-10-CM | POA: Insufficient documentation

## 2023-08-18 DIAGNOSIS — D500 Iron deficiency anemia secondary to blood loss (chronic): Secondary | ICD-10-CM | POA: Insufficient documentation

## 2023-08-18 DIAGNOSIS — Z95811 Presence of heart assist device: Secondary | ICD-10-CM | POA: Insufficient documentation

## 2023-08-18 DIAGNOSIS — I2510 Atherosclerotic heart disease of native coronary artery without angina pectoris: Secondary | ICD-10-CM | POA: Diagnosis not present

## 2023-08-18 DIAGNOSIS — Z86711 Personal history of pulmonary embolism: Secondary | ICD-10-CM | POA: Diagnosis not present

## 2023-08-18 DIAGNOSIS — Z952 Presence of prosthetic heart valve: Secondary | ICD-10-CM | POA: Diagnosis not present

## 2023-08-18 LAB — COMPREHENSIVE METABOLIC PANEL WITH GFR
ALT: 17 U/L (ref 0–44)
AST: 27 U/L (ref 15–41)
Albumin: 2.9 g/dL — ABNORMAL LOW (ref 3.5–5.0)
Alkaline Phosphatase: 135 U/L — ABNORMAL HIGH (ref 38–126)
Anion gap: 9 (ref 5–15)
BUN: 18 mg/dL (ref 6–20)
CO2: 28 mmol/L (ref 22–32)
Calcium: 9.2 mg/dL (ref 8.9–10.3)
Chloride: 99 mmol/L (ref 98–111)
Creatinine, Ser: 1.19 mg/dL — ABNORMAL HIGH (ref 0.44–1.00)
GFR, Estimated: 57 mL/min — ABNORMAL LOW (ref >60)
Glucose, Bld: 90 mg/dL (ref 70–99)
Potassium: 4.1 mmol/L (ref 3.5–5.1)
Sodium: 136 mmol/L (ref 135–145)
Total Bilirubin: 0.6 mg/dL (ref 0.0–1.2)
Total Protein: 8.2 g/dL — ABNORMAL HIGH (ref 6.5–8.1)

## 2023-08-18 LAB — IRON AND TIBC
Iron: 59 ug/dL (ref 28–170)
Saturation Ratios: 16 % (ref 10.4–31.8)
TIBC: 372 ug/dL (ref 250–450)
UIBC: 313 ug/dL

## 2023-08-18 LAB — CBC
HCT: 36.5 % (ref 36.0–46.0)
Hemoglobin: 10.9 g/dL — ABNORMAL LOW (ref 12.0–15.0)
MCH: 27.2 pg (ref 26.0–34.0)
MCHC: 29.9 g/dL — ABNORMAL LOW (ref 30.0–36.0)
MCV: 91 fL (ref 80.0–100.0)
Platelets: 187 K/uL (ref 150–400)
RBC: 4.01 MIL/uL (ref 3.87–5.11)
RDW: 18.8 % — ABNORMAL HIGH (ref 11.5–15.5)
WBC: 6.5 K/uL (ref 4.0–10.5)
nRBC: 0 % (ref 0.0–0.2)

## 2023-08-18 LAB — TSH: TSH: 2.15 u[IU]/mL (ref 0.350–4.500)

## 2023-08-18 LAB — FERRITIN: Ferritin: 95 ng/mL (ref 11–307)

## 2023-08-18 LAB — PROTIME-INR
INR: 1.9 — ABNORMAL HIGH (ref 0.8–1.2)
Prothrombin Time: 22.7 s — ABNORMAL HIGH (ref 11.4–15.2)

## 2023-08-18 LAB — LACTATE DEHYDROGENASE: LDH: 171 U/L (ref 98–192)

## 2023-08-18 LAB — DIGOXIN LEVEL: Digoxin Level: 0.8 ng/mL (ref 0.8–2.0)

## 2023-08-18 MED ORDER — LOSARTAN POTASSIUM 25 MG PO TABS: 50.0000 mg | ORAL_TABLET | Freq: Every day | ORAL | 3 refills | Status: DC

## 2023-08-18 NOTE — Progress Notes (Signed)
 LVAD CLINIC NOTE  Primary Care: [facility], [place], [place] Primary Cardiologist: Dorn Ross, MD HF Cardiologist: Dr. Gardenia EP: Eulas Furbish, MD   HPI: [name] [name] is a 48 y.o. female with hx of saddle PE/DVT s/p IVC filter (2015), HFrEF/NICM, pancytopenia, nonobstructive CAD, morbid obesity, noncompliance, pancytopenia (followed by hematology), tobacco use.  In 5/23 she was admitted with acute hypoxic respiratory failure requiring intubation. LHC during that admit w/ nonobstructive CAD. She was admitted again in 9/23 with syncope, nausea and C2 fracture managed conservatively. LVEF during that admit noted to be 35% w/ severe posterior MR.   Admitted with acute on chronic CHF in 06/24. EF was down to 25-30%.   Had been considered for mTEER and possible LVAD but was lost to follow-up until recent readmission.  Admitted to [facility] ICU in 04/25 with cardiogenic shock. Transferred to [facility] with worsening hypotension and increased pressor requirements.  Echo 4/25 EF 20-25% severe RV dysfunction with septal flattening mod-severe MR. She was discharged with home inotrope with plan to workup for advanced therapies as an ouptatient if compliance improved. Discharge weight 244 lbs.  Underwent mTEER, complicated by torrential MR and damaged leaflet. Required IABP --> Impella 5.5 Underwent HM3 implant on 07/24/23 along with mitral valve replacement.  Interval hx:       Current Outpatient Medications  Medication Sig Dispense Refill   acetaminophen  (TYLENOL ) 500 MG tablet Take 1 tablet (500 mg total) by mouth every 6 (six) hours as needed. 30 tablet 0   albuterol  (VENTOLIN  HFA) 108 (90 Base) MCG/ACT inhaler Inhale 2 puffs into the lungs every 2 (two) hours as needed for wheezing or shortness of breath. 18 g 11   amiodarone  (PACERONE ) 200 MG tablet Take 1 tablet (200 mg total) by mouth daily. 90 tablet 4   atorvastatin  (LIPITOR) 20 MG tablet TAKE ONE TABLET BY MOUTH ONCE DAILY. 90 tablet 3    benzonatate  (TESSALON ) 100 MG capsule Take 1 capsule (100 mg total) by mouth 3 (three) times daily as needed for cough. Do not take with alcohol  or while operating or driving heavy machinery 21 capsule 0   digoxin  (LANOXIN ) 0.125 MG tablet Take 1 tablet (0.125 mg total) by mouth daily. 90 tablet 4   Fe Fum-Vit C-Vit B12-FA (TRIGELS-F FORTE) CAPS capsule Take 1 capsule by mouth daily after breakfast. 90 capsule 4   gabapentin  (NEURONTIN ) 300 MG capsule Take 1 capsule (300 mg total) by mouth 2 (two) times daily. 60 capsule 0   losartan  (COZAAR ) 25 MG tablet Take 1 tablet (25 mg total) by mouth daily. 90 tablet 3   nicotine  (NICODERM CQ  - DOSED IN MG/24 HR) 7 mg/24hr patch Place 1 patch (7 mg total) onto the skin daily. 28 patch 0   oxyCODONE -acetaminophen  (PERCOCET/ROXICET) 5-325 MG tablet Take 1 tablet by mouth every 6 (six) hours as needed for up to 5 days for severe pain (pain score 7-10). 20 tablet 0   pantoprazole  (PROTONIX ) 40 MG tablet Take 1 tablet (40 mg total) by mouth daily. 90 tablet 4   potassium chloride  SA (KLOR-CON  M) 20 MEQ tablet Take 1 tablet (20 mEq total) by mouth daily. 120 tablet 6   senna-docusate (SENOKOT-S) 8.6-50 MG tablet Take 1 tablet by mouth daily. 90 tablet 4   spironolactone  (ALDACTONE ) 25 MG tablet Take 1 tablet (25 mg total) by mouth daily. 180 tablet 4   torsemide  (DEMADEX ) 20 MG tablet Take 2 tablets (40 mg total) by mouth daily. 180 tablet 4   warfarin (  COUMADIN ) 5 MG tablet Take 7.5 mg (1.5 tablets) by mouth daily on Tuesdays, Thursdays, and Saturdays, then take 5 mg (1 tablet) by mouth on all other days. 36 tablet 12   No current facility-administered medications for this encounter.     PHYSICAL EXAM: There were no vitals filed for this visit.  GENERAL: NAD Lungs- normal work of breathing CARDIAC:  JVP: 79 cm          Normal rate with regular rhythm. 3/6 systolic murmur; +1 edema BLE.  ABDOMEN: Soft, non-tender, non-distended.  EXTREMITIES: Warm and  well perfused.  NEUROLOGIC: No obvious FND   Wt Readings from Last 3 Encounters:  08/15/23 108.2 kg (238 lb 9.6 oz)  08/11/23 112.8 kg (248 lb 9.6 oz)  08/05/23 113.4 kg (250 lb)    DATA REVIEW  ECG: SR 91 bpm, PVC  ECHO: 09/24/21: LVEF 30-35%, RV function mildly reduced, severe posterior MR 8/23: LVEF 30-35%, LVID 7.2cm w/ grade III DD, moderate to severe MR. Severely dilated LA.  09/03/17: LVEF 60%-65%.  TEE 2/24: severe eccentric MR, moderately reduced LV function.  07/03/22: LVEF 25-30% 05/07/23: LVEF 25-30%, grade III DD, RV moderately reduced, moderate posterior MR 05/16/23: LVEF 25-30%, RV moderately reduced, moderate to severe MR   CATH: 06/19/21: 40 to 50% proximal right coronary narrowing. Right dominant anatomy Coronary arteries otherwise normal Significant elevation LVEDP 30 mmHg consistent with acute on chronic combined systolic and diastolic heart failure Mild pulmonary hypertension, mean pressure 30 mmHg.  WHO group II etiology based on hemodysnamics.. Capillary wedge mean 24 mmHg.  V wave to 40 mmHg suggesting some degree of mitral regurgitation. Cardiac output 8.5 L/min with index 3.57 L/min/m. Pulmonary resistance 0.7 Wood units.  RHC on 7.5 DBA 05/13/23: HEMODYNAMICS: RA:       19 mmHg (mean) RV:       90/9, 19 mmHg PA:       90/38 mmHg (55 mean) PCWP: 27 mmHg (mean) with v waves to 45                                      Estimated Fick CO/CI   6.6L/min, 2.74L/min/m2         Thermodilution CO/CI   6.4L/min, 2.66L/min/m2                                      TPG  28  mmHg                                               PVR  4.375 Wood Units  PAPi  2.73    ASSESSMENT & PLAN: Chronic systolic HF with biventricular dysfunction/stage D cardiomyopathy (terminal) - LHC in 2023 with nonobstructive CAD.  - RHC 4/25 (on 7.5 DBA): Severely elevated biventricular filling pressures, severe post capillary PH, normal CO by TD. - Echo 4/25 EF 25-30% moderately reduced RV  with mod/sev MR  - NYHA III-IV. Hypervolemic on exam - Continue home Milrinone  0.25 mcg/kg/min. Will need CBC with diff and CMET every 2 weeks.  - Patient seen with Dr. Bensimhon. Suspect LV function slightly better than reported on echo. However, she has significant RV dysfunction and probably severe MR. Ideally would  consider transplant but not a candidate with tobacco use. Likely not an ideal VAD candidate with RV failure. Best option at this point may be mitraclip with eye towards eventual transplant.  - Plan for TEER on 07/09/23 with Dr. Wonda.  - Repeat BMP/BNP today.  - Mildly hypervolemic on exam; additional bumex  2mg  today.     2. Severe MR - Echo 4/25 with moderate to severe MR (suspect may be severe) - Severe MR by TEE; RHC with large V waves.  - TEER on 07/09/23.    3. Hx of PE/DVT w/ IVC filter - Submassive saddle PE extending from the distal main pulmonary artery and to the left and right pulmonary arteries with extension into the left upper lobe, left lower lobe and lingula branches and right upper lobe, right middle lobe and right lower lobes on 04/27/2013  - Evaluated at [facility] where thrombophilia work-up was negative (Lupus inhibitor, anticardiolipin antibodies, and anti beta-2  glycoprotein antibodies were negative; protein C activity 85%, protein S activity 76%, factor V Leiden, and prothrombin gene mutation ) - IVC filter in place; could not be removed due to significant thrombus burden on filter at [facility].  - Previously advised against DOACs due to severity of thrombus burden and weight >250lbs.  - Continue Warfarin   4. Obesity  - There is no height or weight on file to calculate BMI.  - BMI significantly down with diuresis - Plan to start GLP1-RA after TEER.    5. CAD - LHC in 5/23 w/ nonobstructive CAD involving RCA - Inferior WMA on most recent echo - stable; no chest pain.    6. Microcytic anemia - Recieved IV iron  during recent admit  7. Tobacco use - Smoking 3  cigarettes/day - discussed importance of cessation with regards to transplant in the future.    Morene Brownie, MD 1:04 PM

## 2023-08-18 NOTE — Progress Notes (Signed)
 Patient presents for 1 week f/u in VAD Clinic today with her mom [name]. Reports no problems with VAD equipment or concerns with drive line.  Denies lightheadedness, dizziness, falls, swelling, or signs of bleeding. Reports some shortness of breath when walking long distances, but is able to quickly recover with rest.  Experiencing intermittent surgical sternal pain. Has been taking PRN Oxy once daily at night along with Tylenol . States pain is a 7/10. Advised to wear the breast binder given to her in the hospital while sternum heals.    BP elevated today. Per Dr. Zenaida will increase Losartan  to 50mg  daily.  Continued dressing change education provided to pt's mother [name]. Practiced donning sterile gloves today and sent home with extra pairs to continue practicing.   Vital Signs:  Doppler Pressure: 110 Automatic BP: 109/96 (102) HR: 85 SPO2: 97%   Weight: 235.4 lb w/o eqt Last weight: 248 lb  BMI today 33.7 today   VAD Indication: Destination Therapy- smoking   HM3 assessment:   Speed: 6000 rpms Flow: 4.8 Power: 4.9 w    PI: 3.1  Alarms: none Events: none  Fixed speed: 6000 Low speed limit: 5700  Primary controller: back up battery due for replacement in 34 months Secondary controller:  back up battery due for replacement in 34 months    I reviewed the LVAD parameters from today and compared the results to the patient's prior recorded data. LVAD interrogation was NEGATIVE for significant power changes, NEGATIVE for clinical alarms and STABLE for PI events/speed drops. No programming changes were made and pump is functioning within specified parameters. Pt is performing daily controller and system monitor self tests along with completing weekly and monthly maintenance for LVAD equipment.   LVAD equipment check completed and is in good working order. Back-up equipment present. Charged back up battery and performed self-test on equipment.    Annual Equipment Maintenance on  UBC/PM was performed on 08/05/23.    Exit Site Care: Existing VAD dressing removed and site care performed using sterile technique. Drive line exit site cleaned with Chlora prep applicators x 2, allowed to dry, and gauze dressing with silver strip applied flush to exit site. Exit site healing and partially incorporated, the velour is fully implanted at exit site; hub is exposed at site probably due to fluid volume loss. Suture x 2 intact- distal suture removed today. Moderate amount of serousangenous drainage. No redness, tenderness, foul odor or rash noted. Drive line anchor reapplied. Advanced to every other day dressing changes in VAD clinic.        Significant Events on VAD Support:     BP & Labs:  MAP 110 - Doppler is reflecting modified systolic   Hgb 10.9 - No S/S of bleeding. Specifically denies melena/BRBPR or nosebleeds.   LDH stable at 171 with established baseline of 189- 476. Denies tea-colored urine. No power elevations noted on interrogation.    Plan:  Increase Losartan  to 50 mg daily (2 tablets) Wear breast binder to help with sternal pain Coumadin  dosing per Tinnie Redman PharmD Return to clinic in 2 weeks for full visit with Dr Zenaida Return to clinic on Friday for dressing change  [name] Lunger RN, BSN VAD Coordinator 24/7 Pager [phone]

## 2023-08-18 NOTE — Patient Instructions (Signed)
 Increase your Losartan  to 50mg  daily ( 2 tablets) Coumadin  dosing per [facility] Return Friday for dressing change Return in 2 weeks for f.u with Dr. Zenaida

## 2023-08-19 LAB — T3: T3, Total: 71 ng/dL (ref 71–180)

## 2023-08-19 LAB — T4: T4, Total: 8.2 ug/dL (ref 4.5–12.0)

## 2023-08-21 NOTE — Addendum Note (Signed)
 Encounter addended by: [name], MD on: 08/21/2023 9:18 PM  Actions taken: Level of Service modified

## 2023-08-22 ENCOUNTER — Other Ambulatory Visit (HOSPITAL_COMMUNITY): Payer: Self-pay

## 2023-08-22 ENCOUNTER — Ambulatory Visit (HOSPITAL_COMMUNITY)
Admission: RE | Admit: 2023-08-22 | Discharge: 2023-08-22 | Disposition: A | Discharge status: Home / Self Care | Source: Ambulatory Visit | Attending: Cardiology | Admitting: Cardiology

## 2023-08-22 DIAGNOSIS — Z95811 Presence of heart assist device: Secondary | ICD-10-CM | POA: Diagnosis not present

## 2023-08-22 DIAGNOSIS — Z7901 Long term (current) use of anticoagulants: Secondary | ICD-10-CM | POA: Insufficient documentation

## 2023-08-22 DIAGNOSIS — Z4801 Encounter for change or removal of surgical wound dressing: Secondary | ICD-10-CM | POA: Insufficient documentation

## 2023-08-22 NOTE — Progress Notes (Signed)
 Pt presents for dressing in VAD clinic with her cousin NeeNee. No issues with VAD equipment or drive line.  Exit site care: Existing VAD dressing removed and site care performed using sterile technique by pt's mother [name] with VAD Coordinator assistance. Drive line exit site cleaned with Chlora prep applicators x 2, allowed to dry, and gauze dressing with silver strip applied flush to exit site. Exit site healing and partially incorporated, the velour is fully implanted at exit site; hub is exposed at site probably due to fluid volume loss. Suture intact. Small amount of serousangenous drainage. No redness, tenderness, foul odor or rash noted. Cath grip anchor reapplied.     Plan:  Return to clinic Tuesday for dressing change/INR  [name] Lunger RN, BSN VAD Coordinator 24/7 Pager [phone]

## 2023-08-26 ENCOUNTER — Ambulatory Visit (HOSPITAL_COMMUNITY)
Admission: RE | Admit: 2023-08-26 | Discharge: 2023-08-26 | Disposition: A | Discharge status: Home / Self Care | Source: Ambulatory Visit | Attending: Cardiology

## 2023-08-26 ENCOUNTER — Other Ambulatory Visit (HOSPITAL_COMMUNITY)

## 2023-08-26 DIAGNOSIS — Z4509 Encounter for adjustment and management of other cardiac device: Secondary | ICD-10-CM | POA: Insufficient documentation

## 2023-08-26 DIAGNOSIS — Z95811 Presence of heart assist device: Secondary | ICD-10-CM

## 2023-08-26 DIAGNOSIS — Z5982 Transportation insecurity: Secondary | ICD-10-CM | POA: Insufficient documentation

## 2023-08-26 NOTE — Progress Notes (Signed)
 Pt presents for dressing in VAD clinic alone. No issues with VAD equipment or drive line.  Pt complains that she continues to have 7/10 sternal pain despite taking extra strength Tylenol  q6h. Discussed with Dr. Zenaida will prescribed Tramadol  50-100mg  q6h for moderate to severe pain. Rx called into [facility] in [place].  Pt continues to have transportation issues through insurance she plans to discuss with her aunt this afternoon to pre-plan her appointments as insurance wants a 5 day notice now.   Exit site care: Existing VAD dressing removed and site care performed using sterile technique by pt's mother [name] with VAD Coordinator assistance. Drive line exit site cleaned with Chlora prep applicators x 2, allowed to dry, and gauze dressing with silver strip applied flush to exit site. Exit site healing and partially incorporated, the velour is fully implanted at exit site; hub is exposed at site probably due to fluid volume loss. Suture intact. Small amount of serousangenous drainage. No redness, tenderness, foul odor or rash noted. Cath grip anchor reapplied.        Plan:  Return to clinic Friday for dressing change/INR  [name] Lunger RN, BSN VAD Coordinator 24/7 Pager [phone]

## 2023-08-27 ENCOUNTER — Telehealth (HOSPITAL_COMMUNITY): Payer: Self-pay | Admitting: *Deleted

## 2023-08-27 NOTE — Telephone Encounter (Addendum)
 Attempted to reach pt x 2 regarding need to schedule dressing change appt on Friday at time her family can bring her. Also need to schedule dressing change appts for next week as transportation through her insurance requires 5 days notice to set up rides. No answer x 2. Left voicemail requesting call back to schedule appts.   [name] RN VAD Coordinator  Office: [phone]  24/7 Pager: [phone]

## 2023-08-28 ENCOUNTER — Other Ambulatory Visit (HOSPITAL_COMMUNITY): Payer: Self-pay | Admitting: *Deleted

## 2023-08-28 ENCOUNTER — Encounter (HOSPITAL_COMMUNITY): Payer: Self-pay | Admitting: Hematology

## 2023-08-28 DIAGNOSIS — Z7901 Long term (current) use of anticoagulants: Secondary | ICD-10-CM

## 2023-08-28 DIAGNOSIS — Z95811 Presence of heart assist device: Secondary | ICD-10-CM

## 2023-08-29 ENCOUNTER — Ambulatory Visit (HOSPITAL_COMMUNITY)
Admission: RE | Admit: 2023-08-29 | Discharge: 2023-08-29 | Disposition: A | Discharge status: Home / Self Care | Source: Ambulatory Visit | Attending: Cardiology | Admitting: Cardiology

## 2023-08-29 DIAGNOSIS — Z4509 Encounter for adjustment and management of other cardiac device: Secondary | ICD-10-CM | POA: Insufficient documentation

## 2023-08-29 DIAGNOSIS — Z95811 Presence of heart assist device: Secondary | ICD-10-CM | POA: Insufficient documentation

## 2023-08-29 NOTE — Progress Notes (Signed)
 Pt presents for dressing in VAD clinic alone. No issues with VAD equipment or drive line.  Pt complains that she continues to have 7/10 sternal pain despite taking extra strength Tylenol  q6h. Discussed with Dr. Zenaida will prescribed Tramadol  50-100mg  q6h for moderate to severe pain. Rx called into [facility] in [place].  Exit site care: Existing VAD dressing removed and site care performed using sterile technique by pt's mother [name] with VAD Coordinator assistance. Drive line exit site cleaned with Chlora prep applicators x 2, allowed to dry, and gauze dressing with silver strip applied flush to exit site. Exit site healing and partially incorporated, the velour is fully implanted at exit site; hub is exposed at site probably due to fluid volume loss. Remaining suture removed today. Small amount of serousangenous drainage. No redness, tenderness, foul odor or rash noted. Cath grip anchor reapplied.    Plan:  Return to clinic day for dressing change/INR  [name] Lunger RN, BSN VAD Coordinator 24/7 Pager [phone]

## 2023-09-02 ENCOUNTER — Ambulatory Visit (HOSPITAL_BASED_OUTPATIENT_CLINIC_OR_DEPARTMENT_OTHER)
Admission: RE | Admit: 2023-09-02 | Discharge: 2023-09-02 | Disposition: A | Discharge status: Home / Self Care | Source: Ambulatory Visit | Attending: Cardiology | Admitting: Cardiology

## 2023-09-02 DIAGNOSIS — Z95811 Presence of heart assist device: Secondary | ICD-10-CM | POA: Diagnosis not present

## 2023-09-02 DIAGNOSIS — K047 Periapical abscess without sinus: Secondary | ICD-10-CM | POA: Diagnosis not present

## 2023-09-02 DIAGNOSIS — Z4509 Encounter for adjustment and management of other cardiac device: Secondary | ICD-10-CM | POA: Insufficient documentation

## 2023-09-02 DIAGNOSIS — K029 Dental caries, unspecified: Secondary | ICD-10-CM | POA: Diagnosis not present

## 2023-09-02 NOTE — Progress Notes (Signed)
 Pt presents for dressing in VAD clinic with mom [name]. No issues with VAD equipment or drive line.  [name] performed dressing change today with VAD coordinator supervision. Will plan for her to perform dressing change again on Friday in clinic.   Exit site care: Existing VAD dressing removed and site care performed using sterile technique by pt's mother [name]. Drive line exit site cleaned with Chlora prep applicators x 2, allowed to dry, and gauze dressing with silver strip applied flush to exit site. Exit site healing and partially incorporated, the velour is fully implanted at exit site. Small amount of serousangenous drainage. No redness, tenderness, foul odor or rash noted. Cath grip anchor reapplied. Continue twice weekly dressing changes in VAD clinic. Pt has adequate dressing supplies at home.     Plan:  Return to clinic Friday for dressing change/INR  [name] RN VAD Coordinator  Office: [phone]  24/7 Pager: [phone]

## 2023-09-05 ENCOUNTER — Ambulatory Visit (HOSPITAL_COMMUNITY): Payer: Self-pay | Admitting: Pharmacist

## 2023-09-05 ENCOUNTER — Encounter (HOSPITAL_COMMUNITY): Payer: Self-pay

## 2023-09-05 ENCOUNTER — Other Ambulatory Visit: Payer: Self-pay

## 2023-09-05 ENCOUNTER — Other Ambulatory Visit (HOSPITAL_COMMUNITY): Payer: Self-pay

## 2023-09-05 ENCOUNTER — Inpatient Hospital Stay (HOSPITAL_COMMUNITY)
Admission: EM | Admit: 2023-09-05 | Discharge: 2023-09-08 | DRG: 158 | Disposition: A | Discharge status: Home / Self Care | Source: Ambulatory Visit | Attending: Internal Medicine | Admitting: Internal Medicine

## 2023-09-05 ENCOUNTER — Ambulatory Visit (HOSPITAL_BASED_OUTPATIENT_CLINIC_OR_DEPARTMENT_OTHER)
Admission: RE | Admit: 2023-09-05 | Discharge: 2023-09-05 | Disposition: A | Discharge status: Home / Self Care | Payer: Self-pay | Source: Ambulatory Visit | Attending: Cardiology | Admitting: Cardiology

## 2023-09-05 ENCOUNTER — Emergency Department (HOSPITAL_COMMUNITY)

## 2023-09-05 ENCOUNTER — Ambulatory Visit (HOSPITAL_COMMUNITY): Payer: Self-pay | Admitting: Cardiology

## 2023-09-05 DIAGNOSIS — Z86711 Personal history of pulmonary embolism: Secondary | ICD-10-CM

## 2023-09-05 DIAGNOSIS — Z95811 Presence of heart assist device: Secondary | ICD-10-CM | POA: Insufficient documentation

## 2023-09-05 DIAGNOSIS — K122 Cellulitis and abscess of mouth: Secondary | ICD-10-CM | POA: Diagnosis present

## 2023-09-05 DIAGNOSIS — Z79899 Other long term (current) drug therapy: Secondary | ICD-10-CM

## 2023-09-05 DIAGNOSIS — Z4509 Encounter for adjustment and management of other cardiac device: Secondary | ICD-10-CM | POA: Insufficient documentation

## 2023-09-05 DIAGNOSIS — I110 Hypertensive heart disease with heart failure: Secondary | ICD-10-CM | POA: Diagnosis present

## 2023-09-05 DIAGNOSIS — Z6833 Body mass index (BMI) 33.0-33.9, adult: Secondary | ICD-10-CM

## 2023-09-05 DIAGNOSIS — Z604 Social exclusion and rejection: Secondary | ICD-10-CM | POA: Diagnosis present

## 2023-09-05 DIAGNOSIS — Z7901 Long term (current) use of anticoagulants: Secondary | ICD-10-CM | POA: Insufficient documentation

## 2023-09-05 DIAGNOSIS — Z91199 Patient's noncompliance with other medical treatment and regimen due to unspecified reason: Secondary | ICD-10-CM | POA: Diagnosis not present

## 2023-09-05 DIAGNOSIS — Z952 Presence of prosthetic heart valve: Secondary | ICD-10-CM

## 2023-09-05 DIAGNOSIS — Z833 Family history of diabetes mellitus: Secondary | ICD-10-CM

## 2023-09-05 DIAGNOSIS — K029 Dental caries, unspecified: Secondary | ICD-10-CM | POA: Diagnosis present

## 2023-09-05 DIAGNOSIS — Z95828 Presence of other vascular implants and grafts: Secondary | ICD-10-CM | POA: Diagnosis not present

## 2023-09-05 DIAGNOSIS — I5082 Biventricular heart failure: Secondary | ICD-10-CM | POA: Diagnosis present

## 2023-09-05 DIAGNOSIS — F17210 Nicotine dependence, cigarettes, uncomplicated: Secondary | ICD-10-CM | POA: Diagnosis present

## 2023-09-05 DIAGNOSIS — Z8249 Family history of ischemic heart disease and other diseases of the circulatory system: Secondary | ICD-10-CM | POA: Diagnosis not present

## 2023-09-05 DIAGNOSIS — K047 Periapical abscess without sinus: Secondary | ICD-10-CM | POA: Diagnosis present

## 2023-09-05 DIAGNOSIS — I5043 Acute on chronic combined systolic (congestive) and diastolic (congestive) heart failure: Secondary | ICD-10-CM | POA: Insufficient documentation

## 2023-09-05 DIAGNOSIS — Z452 Encounter for adjustment and management of vascular access device: Secondary | ICD-10-CM

## 2023-09-05 DIAGNOSIS — I5084 End stage heart failure: Secondary | ICD-10-CM | POA: Diagnosis present

## 2023-09-05 DIAGNOSIS — Z86718 Personal history of other venous thrombosis and embolism: Secondary | ICD-10-CM | POA: Diagnosis not present

## 2023-09-05 DIAGNOSIS — I5022 Chronic systolic (congestive) heart failure: Secondary | ICD-10-CM | POA: Diagnosis present

## 2023-09-05 DIAGNOSIS — I252 Old myocardial infarction: Secondary | ICD-10-CM

## 2023-09-05 DIAGNOSIS — Z882 Allergy status to sulfonamides status: Secondary | ICD-10-CM | POA: Diagnosis not present

## 2023-09-05 DIAGNOSIS — I340 Nonrheumatic mitral (valve) insufficiency: Secondary | ICD-10-CM | POA: Diagnosis not present

## 2023-09-05 DIAGNOSIS — L0201 Cutaneous abscess of face: Secondary | ICD-10-CM | POA: Diagnosis not present

## 2023-09-05 DIAGNOSIS — I428 Other cardiomyopathies: Secondary | ICD-10-CM | POA: Diagnosis present

## 2023-09-05 DIAGNOSIS — I2510 Atherosclerotic heart disease of native coronary artery without angina pectoris: Secondary | ICD-10-CM | POA: Diagnosis present

## 2023-09-05 HISTORY — DX: Presence of heart assist device: Z95.811

## 2023-09-05 LAB — COMPREHENSIVE METABOLIC PANEL WITH GFR
ALT: 17 U/L (ref 0–44)
AST: 29 U/L (ref 15–41)
Albumin: 2.8 g/dL — ABNORMAL LOW (ref 3.5–5.0)
Alkaline Phosphatase: 126 U/L (ref 38–126)
Anion gap: 12 (ref 5–15)
BUN: 15 mg/dL (ref 6–20)
CO2: 29 mmol/L (ref 22–32)
Calcium: 9.5 mg/dL (ref 8.9–10.3)
Chloride: 93 mmol/L — ABNORMAL LOW (ref 98–111)
Creatinine, Ser: 0.93 mg/dL (ref 0.44–1.00)
GFR, Estimated: >60 mL/min (ref >60)
Glucose, Bld: 87 mg/dL (ref 70–99)
Potassium: 4 mmol/L (ref 3.5–5.1)
Sodium: 134 mmol/L — ABNORMAL LOW (ref 135–145)
Total Bilirubin: 1.6 mg/dL — ABNORMAL HIGH (ref 0.0–1.2)
Total Protein: 8.3 g/dL — ABNORMAL HIGH (ref 6.5–8.1)

## 2023-09-05 LAB — I-STAT CHEM 8, ED
BUN: 23 mg/dL — ABNORMAL HIGH (ref 6–20)
Calcium, Ion: 0.98 mmol/L — ABNORMAL LOW (ref 1.15–1.40)
Chloride: 96 mmol/L — ABNORMAL LOW (ref 98–111)
Creatinine, Ser: 1 mg/dL (ref 0.44–1.00)
Glucose, Bld: 82 mg/dL (ref 70–99)
HCT: 41 % (ref 36.0–46.0)
Hemoglobin: 13.9 g/dL (ref 12.0–15.0)
Potassium: 8 mmol/L (ref 3.5–5.1)
Sodium: 132 mmol/L — ABNORMAL LOW (ref 135–145)
TCO2: 34 mmol/L — ABNORMAL HIGH (ref 22–32)

## 2023-09-05 LAB — LACTATE DEHYDROGENASE: LDH: 183 U/L (ref 98–192)

## 2023-09-05 LAB — PROTIME-INR
INR: 2.3 — ABNORMAL HIGH (ref 0.8–1.2)
Prothrombin Time: 26.8 s — ABNORMAL HIGH (ref 11.4–15.2)

## 2023-09-05 LAB — CBC WITH DIFFERENTIAL/PLATELET
Abs Immature Granulocytes: 0.04 K/uL (ref 0.00–0.07)
Basophils Absolute: 0 K/uL (ref 0.0–0.1)
Basophils Relative: 1 %
Eosinophils Absolute: 0.1 K/uL (ref 0.0–0.5)
Eosinophils Relative: 1 %
HCT: 41.7 % (ref 36.0–46.0)
Hemoglobin: 12.8 g/dL (ref 12.0–15.0)
Immature Granulocytes: 1 %
Lymphocytes Relative: 6 %
Lymphs Abs: 0.5 K/uL — ABNORMAL LOW (ref 0.7–4.0)
MCH: 26.9 pg (ref 26.0–34.0)
MCHC: 30.7 g/dL (ref 30.0–36.0)
MCV: 87.6 fL (ref 80.0–100.0)
Monocytes Absolute: 0.7 K/uL (ref 0.1–1.0)
Monocytes Relative: 7 %
Neutro Abs: 7.6 K/uL (ref 1.7–7.7)
Neutrophils Relative %: 84 %
Platelets: 159 K/uL (ref 150–400)
RBC: 4.76 MIL/uL (ref 3.87–5.11)
RDW: 16.9 % — ABNORMAL HIGH (ref 11.5–15.5)
WBC: 8.9 K/uL (ref 4.0–10.5)
nRBC: 0 % (ref 0.0–0.2)

## 2023-09-05 LAB — CBC
HCT: 41.4 % (ref 36.0–46.0)
Hemoglobin: 13.1 g/dL (ref 12.0–15.0)
MCH: 27.2 pg (ref 26.0–34.0)
MCHC: 31.6 g/dL (ref 30.0–36.0)
MCV: 86.1 fL (ref 80.0–100.0)
Platelets: 153 K/uL (ref 150–400)
RBC: 4.81 MIL/uL (ref 3.87–5.11)
RDW: 17 % — ABNORMAL HIGH (ref 11.5–15.5)
WBC: 8.4 K/uL (ref 4.0–10.5)
nRBC: 0 % (ref 0.0–0.2)

## 2023-09-05 LAB — MRSA NEXT GEN BY PCR, NASAL: MRSA by PCR Next Gen: NOT DETECTED

## 2023-09-05 MED ORDER — TORSEMIDE 20 MG PO TABS
40.0000 mg | ORAL_TABLET | Freq: Every day | ORAL | Status: DC
  Administered 2023-09-06: 40 mg via ORAL
  Filled 2023-09-05: qty 2

## 2023-09-05 MED ORDER — IOHEXOL 350 MG/ML SOLN
75.0000 mL | Freq: Once | INTRAVENOUS | Status: AC | PRN
  Administered 2023-09-05: 75 mL via INTRAVENOUS

## 2023-09-05 MED ORDER — PANTOPRAZOLE SODIUM 40 MG PO TBEC
40.0000 mg | DELAYED_RELEASE_TABLET | Freq: Every day | ORAL | Status: DC
  Administered 2023-09-06 – 2023-09-08 (×3): 40 mg via ORAL
  Filled 2023-09-05 (×3): qty 1

## 2023-09-05 MED ORDER — SENNOSIDES-DOCUSATE SODIUM 8.6-50 MG PO TABS
1.0000 | ORAL_TABLET | Freq: Every day | ORAL | Status: DC
  Administered 2023-09-06 – 2023-09-07 (×2): 1 via ORAL
  Filled 2023-09-05 (×2): qty 1

## 2023-09-05 MED ORDER — LOSARTAN POTASSIUM 50 MG PO TABS
50.0000 mg | ORAL_TABLET | Freq: Every day | ORAL | Status: DC
  Administered 2023-09-06 – 2023-09-08 (×3): 50 mg via ORAL
  Filled 2023-09-05 (×3): qty 1

## 2023-09-05 MED ORDER — NICOTINE 7 MG/24HR TD PT24
7.0000 mg | MEDICATED_PATCH | Freq: Every day | TRANSDERMAL | Status: DC
  Administered 2023-09-05 – 2023-09-08 (×4): 7 mg via TRANSDERMAL
  Filled 2023-09-05 (×5): qty 1

## 2023-09-05 MED ORDER — OXYCODONE HCL 5 MG PO TABS
5.0000 mg | ORAL_TABLET | ORAL | Status: DC | PRN
  Administered 2023-09-05 – 2023-09-08 (×9): 5 mg via ORAL
  Filled 2023-09-05 (×9): qty 1

## 2023-09-05 MED ORDER — MORPHINE SULFATE (PF) 2 MG/ML IV SOLN
2.0000 mg | Freq: Once | INTRAVENOUS | Status: AC
  Administered 2023-09-05: 2 mg via INTRAVENOUS
  Filled 2023-09-05: qty 1

## 2023-09-05 MED ORDER — ALBUTEROL SULFATE HFA 108 (90 BASE) MCG/ACT IN AERS: 2.0000 | INHALATION_SPRAY | RESPIRATORY_TRACT | Status: DC | PRN

## 2023-09-05 MED ORDER — ATORVASTATIN CALCIUM 10 MG PO TABS
20.0000 mg | ORAL_TABLET | Freq: Every day | ORAL | Status: DC
  Administered 2023-09-06 – 2023-09-08 (×3): 20 mg via ORAL
  Filled 2023-09-05 (×3): qty 2

## 2023-09-05 MED ORDER — GABAPENTIN 300 MG PO CAPS
300.0000 mg | ORAL_CAPSULE | Freq: Two times a day (BID) | ORAL | Status: DC
  Administered 2023-09-06 – 2023-09-08 (×5): 300 mg via ORAL
  Filled 2023-09-05 (×6): qty 1

## 2023-09-05 MED ORDER — SODIUM CHLORIDE 0.9 % IV SOLN
3.0000 g | Freq: Four times a day (QID) | INTRAVENOUS | Status: DC
  Administered 2023-09-05 – 2023-09-08 (×11): 3 g via INTRAVENOUS
  Filled 2023-09-05 (×11): qty 8

## 2023-09-05 MED ORDER — DIGOXIN 125 MCG PO TABS
0.1250 mg | ORAL_TABLET | Freq: Every day | ORAL | Status: DC
  Administered 2023-09-06 – 2023-09-08 (×3): 0.125 mg via ORAL
  Filled 2023-09-05 (×3): qty 1

## 2023-09-05 MED ORDER — FE FUM-VIT C-VIT B12-FA 460-60-0.01-1 MG PO CAPS
1.0000 | ORAL_CAPSULE | Freq: Every day | ORAL | Status: DC
  Administered 2023-09-06 – 2023-09-08 (×3): 1 via ORAL
  Filled 2023-09-05 (×3): qty 1

## 2023-09-05 MED ORDER — ALBUTEROL SULFATE (2.5 MG/3ML) 0.083% IN NEBU: 2.5000 mg | INHALATION_SOLUTION | RESPIRATORY_TRACT | Status: DC | PRN

## 2023-09-05 MED ORDER — ACETAMINOPHEN 325 MG PO TABS
650.0000 mg | ORAL_TABLET | ORAL | Status: DC | PRN
  Administered 2023-09-06 – 2023-09-07 (×3): 650 mg via ORAL
  Filled 2023-09-05 (×3): qty 2

## 2023-09-05 MED ORDER — SODIUM CHLORIDE 0.9 % IV SOLN
3.0000 g | Freq: Once | INTRAVENOUS | Status: AC
  Administered 2023-09-05: 3 g via INTRAVENOUS
  Filled 2023-09-05: qty 8

## 2023-09-05 MED ORDER — SPIRONOLACTONE 25 MG PO TABS
25.0000 mg | ORAL_TABLET | Freq: Every day | ORAL | Status: DC
  Administered 2023-09-06 – 2023-09-08 (×3): 25 mg via ORAL
  Filled 2023-09-05 (×3): qty 1

## 2023-09-05 MED ORDER — AMIODARONE HCL 200 MG PO TABS
200.0000 mg | ORAL_TABLET | Freq: Every day | ORAL | Status: DC
  Administered 2023-09-06 – 2023-09-08 (×3): 200 mg via ORAL
  Filled 2023-09-05 (×3): qty 1

## 2023-09-05 NOTE — ED Notes (Signed)
Informed RN of monitor alerts ?

## 2023-09-05 NOTE — Progress Notes (Signed)
 Pharmacy Antibiotic Note  MARIONETTE MESKILL is a 48 y.o. female admitted on 09/05/2023 with facial swelling and teeth abscess.  Hx LVAD HM III implant 7/25, wbc wnl afebrile Cr 1 crcl 68ml/min. Pharmacy has been consulted for Unasyn  dosing.  Plan: Unasun 3gm IV q6hr   Height: 5' 10 (177.8 cm) Weight: 106.1 kg (234 lb) IBW/kg (Calculated) : 68.5  No data recorded.  Recent Labs  Lab 09/05/23 1011 09/05/23 1143 09/05/23 1204 09/05/23 1413  WBC 8.4 8.9  --   --   CREATININE  --   --  0.93 1.00    Estimated Creatinine Clearance: 92.7 mL/min (by C-G formula based on SCr of 1 mg/dL).    Allergies  Allergen Reactions   Sulfa  Antibiotics Itching, Swelling and Rash    Lip swelling    Antimicrobials this admission:   Dose adjustments this admission:   Microbiology results:    Olam Chalk Pharm.D. CPP, BCPS Clinical Pharmacist [phone] 09/05/2023 4:21 PM

## 2023-09-05 NOTE — Progress Notes (Signed)
 ED Pharmacy Antibiotic Sign Off An antibiotic consult was received from an ED provider for unasyn  per pharmacy dosing for abscess. A chart review was completed to assess appropriateness.   The following one time order(s) were placed:  Unasyn  3g IV x 1  Further antibiotic and/or antibiotic pharmacy consults should be ordered by the admitting provider if indicated.   Thank you for allowing pharmacy to be a part of this patient's care.   Isobel Eisenhuth, Vernell Helling, [facility]  Clinical Pharmacist 09/05/23 12:51 PM

## 2023-09-05 NOTE — Progress Notes (Signed)
 Pt presents for dressing in VAD clinic with her mother [name]. No issues with VAD equipment or drive line.  Pt has severe swelling on the left side of she face that she states has been going on for 2 days. She states it is very painful.   Orthopantogram performed 07/17/23 during VAD evaluation. No intervention performed at that times due to urgent need of VAD.  IMPRESSION: 1. Dental caries within the crown of tooth # 18 and tooth # 12. 2. Indeterminate 11 mm lytic lesion within the maxilla overlying the crown of tooth # 16. This would be better assessed with contrast enhanced maxillofacial CT imaging.  Pt seen by Dr. Rolan in VAD Clinic who advised pt to go to the ED for imaging and IV antibiotics.Discussed with ED Charge nurse and advised to call the on call oral surgeons office to confirm provider sees patients at [facility]. Dr. Meldon office called and confirmed that he is on call till 4pm and a consult will need to be placed by ED physician once evaluated in ED.   20G IV placed in right hand. BMET CBC LDH and INR drawn and sent to lab.    Exit site care: Existing VAD dressing removed and site care performed using sterile technique by pt's mother [name]. Drive line exit site cleaned with Chlora prep applicators x 2, allowed to dry, and gauze dressing with silver strip applied flush to exit site. Exit site healing and partially incorporated, the velour is fully implanted at exit site. Small amount of serousangenous drainage. No redness, tenderness, foul odor or rash noted. Cath grip anchor reapplied. Continue twice weekly dressing changes in VAD clinic. Pt has adequate dressing supplies at home.       Plan: Pt sent to ED. Charge RN aware. Pt registered and placed in ED room 35.   Updated VAD Providers (Dr Cherrie) about the above. No LVAD issues and pump is functioning as expected. Able to independently manage LVAD equipment. No LVAD needs at this time.   [name] Lunger RN, BSN VAD  Coordinator 24/7 Pager [phone]

## 2023-09-05 NOTE — H&P (Addendum)
 Advanced Heart Failure VAD History and Physical Note   PCP-Cardiologist: Ria Commander, DO   Reason for Admission: Dental abscess  HPI:   [name] [name] is a 48 y.o. female with hx of saddle PE/DVT s/p IVC filter (2015), HFrEF/NICM, pancytopenia, nonobstructive CAD, morbid obesity, noncompliance, pancytopenia (followed by hematology), tobacco use.   Admitted with acute on chronic CHF in 06/24. EF was down to 25-30%.    Had been considered for mTEER and possible LVAD but was lost to follow-up until recent readmission.   Admitted to [facility] ICU in 04/25 with cardiogenic shock. Transferred to [facility] with worsening hypotension and increased pressor requirements.  Echo 4/25 EF 20-25% severe RV dysfunction with septal flattening mod-severe MR. She was discharged with home inotrope with plan to workup for advanced therapies as an ouptatient if compliance improved. Discharge weight 244 lbs.   Underwent mTEER, complicated by torrential MR and damaged leaflet. Required IABP --> Impella 5.5 Underwent HM3 implant on 07/24/23 along with mitral valve replacement.  [name] [name] presented to LVAD f/u this morning relatively stable from an LVAD perspective but with significant swelling noted on the L side of her face that has been worsening over the last 2 days.  Orthopantogram 07/17/23 showed: Dental caries within the crown of tooth # 18 and tooth # 12. Indeterminate 11 mm lytic lesion within the maxilla overlying the crown of tooth # 16. Treatment deferred at that time with urgent need for LVAD. Advised to go to the ED for further evaluation. Suspect she will need IV abx and surgical intervention.   Work up so far in the ED: K 4 (8 was a false read on iStat), Na 134, SCr 0.93, AST/ALT WNL, WBC WNL 8.9, LDH 183. Neck CT showed Soft tissue swelling and enhancement involving the left palatine tonsils and pharyngeal mucosal space, with associated soft tissue edema in the floor of the mouth on the left and  stranding of the subcutaneous fat in the left face. No discrete abscess. Mildly prominent left submandibular and left level 2 lymph nodes, likely reactive. Case discussed with Dr. Joanette who is the on call oral surgeon. He plans to come in today vs early tomorrow for further eval. Continue abx for now.   LVAD INTERROGATION:  HeartMate III LVAD:  Flow 4.8 liters/min, speed 6000, power 4.9, PI 3.2.  Unable to fully interrogate as she is on batteries.    Home Medications Prior to Admission medications   Medication Sig Start Date End Date Taking? Authorizing Provider  acetaminophen  (TYLENOL ) 500 MG tablet Take 1 tablet (500 mg total) by mouth every 6 (six) hours as needed. 08/08/23   [name], [name], DO  albuterol  (VENTOLIN  HFA) 108 (90 Base) MCG/ACT inhaler Inhale 2 puffs into the lungs every 2 (two) hours as needed for wheezing or shortness of breath. 07/09/22   [name], MD  amiodarone  (PACERONE ) 200 MG tablet Take 1 tablet (200 mg total) by mouth daily. 08/06/23   [name], [name], NP  atorvastatin  (LIPITOR) 20 MG tablet TAKE ONE TABLET BY MOUTH ONCE DAILY. 04/30/23   Alvan Dorn FALCON, MD  benzonatate  (TESSALON ) 100 MG capsule Take 1 capsule (100 mg total) by mouth 3 (three) times daily as needed for cough. Do not take with alcohol  or while operating or driving heavy machinery 04/24/72   Grooms, [place], PA-C  digoxin  (LANOXIN ) 0.125 MG tablet Take 1 tablet (0.125 mg total) by mouth daily. 05/17/23   [name], [name], NP  Fe Fum-Vit C-Vit B12-FA (TRIGELS-F FORTE) CAPS  capsule Take 1 capsule by mouth daily after breakfast. 08/06/23   [name], [name], NP  gabapentin  (NEURONTIN ) 300 MG capsule Take 1 capsule (300 mg total) by mouth 2 (two) times daily. 08/05/23   [name], [name], NP  losartan  (COZAAR ) 25 MG tablet Take 2 tablets (50 mg total) by mouth daily. 08/18/23   [name], MD  nicotine  (NICODERM CQ  - DOSED IN MG/24 HR) 7 mg/24hr patch Place 1 patch (7 mg total) onto the skin daily. 08/15/23   Grooms,  [name], PA-C  pantoprazole  (PROTONIX ) 40 MG tablet Take 1 tablet (40 mg total) by mouth daily. 08/06/23   [name], [name], NP  potassium chloride  SA (KLOR-CON  M) 20 MEQ tablet Take 1 tablet (20 mEq total) by mouth daily. 08/05/23   [name], [name], NP  senna-docusate (SENOKOT-S) 8.6-50 MG tablet Take 1 tablet by mouth daily. 08/06/23   [name], [name], NP  spironolactone  (ALDACTONE ) 25 MG tablet Take 1 tablet (25 mg total) by mouth daily. 08/05/23   [name], [name], NP  torsemide  (DEMADEX ) 20 MG tablet Take 2 tablets (40 mg total) by mouth daily. 08/06/23   [name], [name], NP  warfarin (COUMADIN ) 5 MG tablet Take 7.5 mg (1.5 tablets) by mouth daily on Tuesdays, Thursdays, and Saturdays, then take 5 mg (1 tablet) by mouth on all other days. 08/05/23   [name], [name], NP    Past Medical History: Past Medical History:  Diagnosis Date   Anemia    Blood transfusion without reported diagnosis    CAD (coronary artery disease)    Nonobstructive   Closed left ankle fracture    August 30 2012   Clotting disorder [facility])    DVT (deep venous thrombosis) (HCC)    L leg   Fibroid tumor    HFrEF (heart failure with reduced ejection fraction) (HCC)    Hidradenitis    Hypertension    Mitral regurgitation    Myocardial infarction (HCC)    NICM (nonischemic cardiomyopathy) (HCC)    NSVT (nonsustained ventricular tachycardia) (HCC)    Obesity    OSA on CPAP    Pulmonary embolism (HCC) 04/2013   Sleep apnea     Past Surgical History: Past Surgical History:  Procedure Laterality Date   ABSCESS DRAINAGE Bilateral 01/10/2015   BREAST BIOPSY Right 02/01/2020   benign   COLONOSCOPY WITH PROPOFOL  N/A 06/08/2018   Procedure: COLONOSCOPY WITH PROPOFOL ;  Surgeon: Shaaron Lamar HERO, MD;  [facility];  Service: Endoscopy;  Laterality: N/A;  2:30pm   CYST REMOVAL TRUNK     IABP INSERTION N/A 07/09/2023   Procedure: IABP Insertion;  Surgeon: Wonda Sharper, MD;  Location: [facility] INVASIVE CV LAB;  Service: Cardiovascular;   Laterality: N/A;   INSERTION OF IMPLANTABLE LEFT VENTRICULAR ASSIST DEVICE N/A 07/24/2023   Procedure: INSERTION OF IMPLANTABLE LEFT VENTRICULAR ASSIST DEVICE USING HEARTMATE 3;  Surgeon: Lucas Dorise POUR, MD;  Location: [facility] OR;  Service: Open Heart Surgery;  Laterality: N/A;   INTRAOPERATIVE TRANSESOPHAGEAL ECHOCARDIOGRAM N/A 07/10/2023   Procedure: ECHOCARDIOGRAM, TRANSESOPHAGEAL, INTRAOPERATIVE;  Surgeon: Lucas Dorise POUR, MD;  Location: [facility] OR;  Service: Open Heart Surgery;  Laterality: N/A;   IR FLUORO GUIDE CV LINE RIGHT  05/16/2023   IR US  GUIDE VASC ACCESS RIGHT  05/16/2023   IVC FILTER PLACEMENT ([facility] HX)     LOWER EXTREMITY VENOGRAPHY N/A 02/07/2021   Procedure: LOWER EXTREMITY VENOGRAPHY;  Surgeon: Lanis Fonda BRAVO, MD;  Location: [facility] INVASIVE CV LAB;  Service: Cardiovascular;  Laterality: N/A;   MITRAL VALVE  REPLACEMENT N/A 07/24/2023   Procedure: MITRAL VALVE REPLACEMENT USING MEDTRONIC MOSAIC PORCINE BIOPROSTHESIS MITRAL VALVE SIZE ;  Surgeon: Lucas Dorise POUR, MD;  Location: [facility] OR;  Service: Open Heart Surgery;  Laterality: N/A;   PERIPHERAL VASCULAR THROMBECTOMY N/A 02/07/2021   Procedure: PERIPHERAL VASCULAR THROMBECTOMY;  Surgeon: Lanis Fonda BRAVO, MD;  Location: [facility] INVASIVE CV LAB;  Service: Cardiovascular;  Laterality: N/A;   PLACEMENT OF IMPELLA LEFT VENTRICULAR ASSIST DEVICE Left 07/10/2023   Procedure: INSERTION, CARDIAC ASSIST DEVICE, IMPELLA 5.5;  Surgeon: Lucas Dorise POUR, MD;  Location: [facility] OR;  Service: Open Heart Surgery;  Laterality: Left;   REMOVAL OF IMPELLA LEFT VENTRICULAR ASSIST DEVICE  07/24/2023   Procedure: REMOVAL, CARDIAC ASSIST DEVICE, IMPELLA 5.5;  Surgeon: Lucas Dorise POUR, MD;  Location: [facility] OR;  Service: Open Heart Surgery;;   RIGHT HEART CATH N/A 03/08/2022   Procedure: RIGHT HEART CATH;  Surgeon: Gardenia Led, DO;  Location: [facility] INVASIVE CV LAB;  Service: Cardiovascular;  Laterality: N/A;   RIGHT HEART CATH N/A 10/04/2022   Procedure: RIGHT HEART  CATH;  Surgeon: Gardenia Led, DO;  Location: [facility] INVASIVE CV LAB;  Service: Cardiovascular;  Laterality: N/A;   RIGHT HEART CATH N/A 05/13/2023   Procedure: RIGHT HEART CATH;  Surgeon: [name], MD;  Location: [facility] INVASIVE CV LAB;  Service: Cardiovascular;  Laterality: N/A;   RIGHT HEART CATH N/A 06/02/2023   Procedure: RIGHT HEART CATH;  Surgeon: Cherrie Toribio SAUNDERS, MD;  Location: [facility] INVASIVE CV LAB;  Service: Cardiovascular;  Laterality: N/A;   RIGHT/LEFT HEART CATH AND CORONARY ANGIOGRAPHY N/A 06/19/2021   Procedure: RIGHT/LEFT HEART CATH AND CORONARY ANGIOGRAPHY;  Surgeon: Claudene Victory ORN, MD;  Location: [facility] INVASIVE CV LAB;  Service: Cardiovascular;  Laterality: N/A;   TEE WITHOUT CARDIOVERSION N/A 03/08/2022   Procedure: TRANSESOPHAGEAL ECHOCARDIOGRAM (TEE);  Surgeon: Gardenia Led, DO;  Location: [facility] ENDOSCOPY;  Service: Cardiovascular;  Laterality: N/A;   TEE WITHOUT CARDIOVERSION N/A 07/24/2023   Procedure: ECHOCARDIOGRAM, TRANSESOPHAGEAL;  Surgeon: Lucas Dorise POUR, MD;  Location: [facility] OR;  Service: Open Heart Surgery;  Laterality: N/A;   TRANSCATHETER MITRAL EDGE TO EDGE REPAIR N/A 07/09/2023   Procedure: TRANSCATHETER MITRAL EDGE TO EDGE REPAIR;  Surgeon: Wonda Sharper, MD;  Location: [facility] INVASIVE CV LAB;  Service: Cardiovascular;  Laterality: N/A;   TRANSESOPHAGEAL ECHOCARDIOGRAM (CATH LAB) N/A 06/02/2023   Procedure: TRANSESOPHAGEAL ECHOCARDIOGRAM;  Surgeon: Cherrie Toribio SAUNDERS, MD;  Location: [facility] INVASIVE CV LAB;  Service: Cardiovascular;  Laterality: N/A;   TRANSESOPHAGEAL ECHOCARDIOGRAM (CATH LAB) N/A 07/09/2023   Procedure: TRANSESOPHAGEAL ECHOCARDIOGRAM;  Surgeon: Wonda Sharper, MD;  Location: [facility] INVASIVE CV LAB;  Service: Cardiovascular;  Laterality: N/A;    Family History: Family History  Problem Relation Age of Onset   Hypertension Mother    Diabetes Paternal Aunt    Diabetes Paternal Uncle    Other Father        blood clots   Cancer Maternal Grandmother     Heart attack Paternal Grandmother    Colon cancer Neg Hx     Social History: Social History   Socioeconomic History   Marital status: Widowed    Spouse name: Not on file   Number of children: Not on file   Years of education: Not on file   Highest education level: 12th grade  Occupational History   Not on file  Tobacco Use   Smoking status: Every Day    Types: Cigarettes    Passive exposure: Never   Smokeless tobacco: Never  Tobacco comments:    Smokes 2-3 cigarettes daily as of 02/13/22  Vaping Use   Vaping status: Never Used  Substance and Sexual Activity   Alcohol  use: Yes    Alcohol /week: 2.0 standard drinks of alcohol     Types: 2 Cans of beer per week    Comment: twice a week   Drug use: No   Sexual activity: Not Currently    Birth control/protection: Pill, None  Other Topics Concern   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    Has a 47 year old Aviauna.   Live with parents.   Was working 5 days a weeks.   Not working right now.    Attends church.    Social Drivers of Corporate investment banker Strain: Low Risk  (08/11/2023)   Overall Financial Resource Strain (CARDIA)    Difficulty of Paying Living Expenses: Not hard at all  Food Insecurity: No Food Insecurity (08/11/2023)   Hunger Vital Sign    Worried About Running Out of Food in the Last Year: Never true    Ran Out of Food in the Last Year: Never true  Transportation Needs: No Transportation Needs (08/11/2023)   PRAPARE - Administrator, Civil Service (Medical): No    Lack of Transportation (Non-Medical): No  Physical Activity: Insufficiently Active (04/03/2023)   Exercise Vital Sign    Days of Exercise per Week: 2 days    Minutes of Exercise per Session: 10 min  Stress: No Stress Concern Present (04/03/2023)   [employer] of Occupational Health - Occupational Stress Questionnaire    Feeling of Stress : Only a little   Social Connections: Socially Isolated (08/11/2023)   Social Connection and Isolation Panel    Frequency of Communication with Friends and Family: More than three times a week    Frequency of Social Gatherings with Friends and Family: Three times a week    Attends Religious Services: Never    Active Member of Clubs or Organizations: No    Attends Banker Meetings: Not on file    Marital Status: Widowed    Allergies:  Allergies  Allergen Reactions   Sulfa  Antibiotics Itching, Swelling and Rash    Lip swelling    Objective:    Vital Signs:   Pulse Rate:  [73-76] 76 (08/15 1345) Resp:  [13-16] 13 (08/15 1400) SpO2:  [97 %-100 %] 97 % (08/15 1400) Weight:  [106.1 kg] 106.1 kg (08/15 1119)   Filed Weights   09/05/23 1119  Weight: 106.1 kg   Mean arterial Pressure None in ED  Physical Exam  General:  Well appearing. No resp difficulty Neck:  JVP flat. Swelling noted to L maxillary/ neck area. Warm to touch. Cor: Mechanical heart sounds with LVAD hum present. Lungs: Clear Driveline: C/D/I; securement device intact and driveline incorporated Extremities: no edema Neuro: alert & oriented x3. Affect pleasant   Telemetry   NSR 70s   EKG   NSR 1 AVB 07/25/23  Labs    Basic Metabolic Panel: Recent Labs  Lab 09/05/23 1204 09/05/23 1413  NA 134* 132*  K 4.0 8.0*  CL 93* 96*  CO2 29  --   GLUCOSE 87 82  BUN 15 23*  CREATININE 0.93 1.00  CALCIUM  9.5  --     Liver Function Tests: Recent Labs  Lab 09/05/23 1204  AST 29  ALT 17  ALKPHOS  126  BILITOT 1.6*  PROT 8.3*  ALBUMIN  2.8*   No results for input(s): LIPASE, AMYLASE in the last 168 hours. No results for input(s): AMMONIA in the last 168 hours.  CBC: Recent Labs  Lab 09/05/23 1011 09/05/23 1143 09/05/23 1413  WBC 8.4 8.9  --   NEUTROABS  --  7.6  --   HGB 13.1 12.8 13.9  HCT 41.4 41.7 41.0  MCV 86.1 87.6  --   PLT 153 159  --     Cardiac Enzymes: No results for input(s):  CKTOTAL, CKMB, CKMBINDEX, TROPONINI in the last 168 hours.  BNP: BNP (last 3 results) Recent Labs    07/04/23 1057 07/25/23 0500 07/31/23 0519  BNP 612.0* 938.1* 856.6*    ProBNP (last 3 results) No results for input(s): PROBNP in the last 8760 hours.   CBG: No results for input(s): GLUCAP in the last 168 hours.  Coagulation Studies: Recent Labs    09/05/23 1143  LABPROT 26.8*  INR 2.3*    Imaging    CT Soft Tissue Neck W Contrast Result Date: 09/05/2023 EXAM: CT NECK WITH CONTRAST 09/05/2023 03:10:40 PM TECHNIQUE: CT of the neck was performed with the administration of intravenous contrast (75mL Omni 350). Multiplanar reformatted images are provided for review. Automated exposure control, iterative reconstruction, and/or weight based adjustment of the mA/kV was utilized to reduce the radiation dose to as low as reasonably achievable. COMPARISON: CT of the neck dated 10/10/2022. CLINICAL HISTORY: Soft tissue infection suspected, neck, xray done. Table formatting from the original note was not included. Patient comes in POV from LVAD appointment where she was getting her dressing changed. Patient has swelling and pain to her mouth related to a tooth. Doctor there said she had an abscess and would likely need antibiotics and to see an oral surgeon so he told her to come here. FINDINGS: AERODIGESTIVE TRACT: There is some soft tissue swelling and enhancement involving the left palatine tonsils and pharyngeal mucosal space. There is no discrete abscess present, but there is soft tissue edema in the floor of the mouth on the left. SALIVARY GLANDS: There are a few mildly prominent left submandibular and left level 2 lymph nodes. The nodes appear reactive. THYROID : There is an ovoid circumscribed lesion present within the inferior pole of the right lobe of the thyroid , measuring approximately 2.1 x 1.4 x 2.4 cm. There is also a round lesion within the left lobe measuring about 10 mm  in diameter. LYMPH NODES: There are a few mildly prominent left submandibular and left level 2 lymph nodes. The nodes appear reactive. SOFT TISSUES: There is stranding of the subcutaneous fat in the left face. BRAIN, ORBITS, SINUSES AND MASTOIDS: No acute abnormality. LUNGS AND MEDIASTINUM: There is mild paraseptal emphysema present anteriorly within the upper lobes bilaterally. The patient is status post sternotomy. BONES: No focal bone abnormality. IMPRESSION: 1. Soft tissue swelling and enhancement involving the left palatine tonsils and pharyngeal mucosal space, with associated soft tissue edema in the floor of the mouth on the left and stranding of the subcutaneous fat in the left face. No discrete abscess. 2. Mildly prominent left submandibular and left level 2 lymph nodes, likely reactive. Electronically signed by: evalene coho 09/05/2023 03:30 PM EDT RP Workstation: [id number]    Patient Profile:   [name] [name] is a 48 y.o. female with hx of saddle PE/DVT s/p IVC filter (2015), HFrEF/NICM, pancytopenia, nonobstructive CAD, morbid obesity, noncompliance, pancytopenia (followed by hematology), tobacco use. Patient to be  admitted with dental abscess.   Assessment/Plan:    Suspected dental abscess 2/2 dental caries - Orthopantogram 07/17/23 showed: Dental caries within the crown of tooth # 18 and tooth # 12. Indeterminate 11 mm lytic lesion within the maxilla overlying the crown of tooth # 16. Treatment deferred at that time with urgent need for LVAD. - Now with significant swelling to L side of her face - Neck CT showed Soft tissue swelling and enhancement involving the left palatine tonsils and pharyngeal mucosal space, with associated soft tissue edema in the floor of the mouth on the left and stranding of the subcutaneous fat in the left face. No discrete abscess. Mildly prominent left submandibular and left level 2 lymph nodes, likely reactive. - Continue Unasyn   - Dr. Joanette planning on  coming in later today vs early tomorrow for further eval - Added oxycodone  for pain   Chronic systolic HF with biventricular/stage D CM s/p HM III 07/29/23 with concurrent MVR - NYHA class II - Appears euvolemic on exam.  Continue torsemide  40mg  daily, - Continue losartan  to 50mg  daily, follow MAPs - Continue spironolactone  25mg  daily - Continue digoxin  0.125, check level tomorrow morning - INR 2.3, goal 2-3, discussed with pharmD. Running on lower end with possible intervention. Start heparin  gtt when INR 1.5 or less. Hold Warfarin for now.  - LDH 183  Severe MR s/p MVR - Echo 4/25 with severe MR - TEER 6/25 complicated by worsening MR - Now s/p MVR  Hx PE/DVT w/ IVC filter - Evaluated at [facility] where thrombophilia work-up was negative (Lupus inhibitor, anticardiolipin antibodies, and anti beta-2  glycoprotein antibodies were negative; protein C activity 85%, protein S activity 76%, factor V Leiden, and prothrombin gene mutation ) - IVC filter in place; could not be removed due to significant thrombus burden on filter at [facility].  - On warfarin at home. Holding with upcoming procedure.   CAD - LHC in 5/23 w/ nonobstructive CAD involving RCA - Inferior WMA on most recent echo - No chest pain.  - Continue statin  Obesity - Body mass index is 33.58 kg/m.  - Improving, may need GLP-1 OP  Tobacco use - Continue cessation with plans for possible transplant in the future  I reviewed the LVAD parameters from today, and compared the results to the patient's prior recorded data.  No programming changes were made.  The LVAD is functioning within specified parameters.  The patient performs LVAD self-test daily.  LVAD interrogation was negative for any significant power changes, alarms or PI events/speed drops.  LVAD equipment check completed and is in good working order.  Back-up equipment present.   LVAD education done on emergency procedures and precautions and reviewed exit site care.  Length of  Stay: 0  Beckey LITTIE Coe, NP 09/05/2023, 4:33 PM  VAD Team Pager [phone] (7am - 7am) +++VAD ISSUES ONLY+++   Advanced Heart Failure Team Pager [phone] (M-F; 7a - 5p)  Please contact [facility] for night-coverage after hours (5p -7a ) and weekends on [email or web address] for all non- LVAD Issues   Patient seen and examined with the above-signed Advanced Practice Provider and/or Housestaff. I personally reviewed laboratory data, imaging studies and relevant notes. I independently examined the patient and formulated the important aspects of the plan. I have edited the note to reflect any of my changes or salient points. I have personally discussed the plan with the patient and/or family.  48 y/o woman as above who underwent recent LVAD placement on 07/29/23.  Now presents with progressive dental abscess confirmed by H&N CT. WBC ok.   Has been started on Unasyn  and EDP has discussed cased with oral surgeon. Planning for OR tonight or tomorrow.   HF has been stable. VAD parameters stable. INR 2.3  General:  NAD.  HEENT: normal  swelling to L jaw and neck Neck: supple. JVP not elevated.   Cor: LVAD hum.  Lungs: Clear. Abdomen: obese soft, nontender, non-distended. No hepatosplenomegaly. No bruits or masses. Good bowel sounds. Driveline site clean. Anchor in place.  Extremities: no cyanosis, clubbing, rash. Warm no edema  Neuro: alert & oriented x 3. No focal deficits. Moves all 4 without problem   Will need I&D of large dental abscess. Hold warfarin. (Can give FFP if needed for OR) Continue Unasyn .   Will have VAD RNs ready to support surgical team in OR with device management.   Given hypercoaguability will need to restart heparin  as soon as possible.   Toribio Fuel, MD  4:48 PM

## 2023-09-05 NOTE — Progress Notes (Signed)
 PHARMACY - ANTICOAGULATION CONSULT NOTE  Pharmacy Consult for warfarin / heparin   Indication: LVAD HM III  Allergies  Allergen Reactions   Sulfa  Antibiotics Itching, Swelling and Rash    Lip swelling    Patient Measurements: Height: 5' 10 (177.8 cm) Weight: 106.1 kg (234 lb) IBW/kg (Calculated) : 68.5 HEPARIN  DW (KG): 91.8  Vital Signs: Pulse Rate: 76 (08/15 1345)  Labs: Recent Labs    09/05/23 1011 09/05/23 1143 09/05/23 1204 09/05/23 1413  HGB 13.1 12.8  --  13.9  HCT 41.4 41.7  --  41.0  PLT 153 159  --   --   LABPROT  --  26.8*  --   --   INR  --  2.3*  --   --   CREATININE  --   --  0.93 1.00    Estimated Creatinine Clearance: 92.7 mL/min (by C-G formula based on SCr of 1 mg/dL).   Medical History: Past Medical History:  Diagnosis Date   Anemia    Blood transfusion without reported diagnosis    CAD (coronary artery disease)    Nonobstructive   Closed left ankle fracture    August 30 2012   Clotting disorder [facility])    DVT (deep venous thrombosis) (HCC)    L leg   Fibroid tumor    HFrEF (heart failure with reduced ejection fraction) (HCC)    Hidradenitis    Hypertension    Mitral regurgitation    Myocardial infarction (HCC)    NICM (nonischemic cardiomyopathy) (HCC)    NSVT (nonsustained ventricular tachycardia) (HCC)    Obesity    OSA on CPAP    Pulmonary embolism (HCC) 04/2013   Sleep apnea       Assessment: 47yof with HF s/p LVAD HM III implant 07/24/23 on warfarin PTA 7.5mg  TTSa/5mg  MWFSu admit INR 2.3 at goal  Admit with facial swelling and teeth abscess   Goal of Therapy:  INR 2-2.5 Heparin  level < 0.3 Monitor platelets by anticoagulation protocol: Yes   Plan:  Hold warfarin for now  Daily protime and CBC  Start heparin  drip if INR < 1.5  Olam Chalk Pharm.D. CPP, BCPS Clinical Pharmacist [phone] 09/05/2023 4:17 PM

## 2023-09-05 NOTE — ED Triage Notes (Signed)
 Patient comes in POV from LVAD appointment where she was getting her dressing changed. Patient has swelling and pain to her mouth related to a tooth. Doctor there said she had an abscess and would likely need antibiotics and to see an oral surgeon so he told her to come here.

## 2023-09-05 NOTE — ED Provider Notes (Signed)
 [place] EMERGENCY DEPARTMENT AT [facility] Provider Note   CSN: [id number] Arrival date & time: 09/05/23  1101     Patient presents with: Dental Abscess   [name] [name] is a 48 y.o. female.   HPI   48 year old female sent here from [facility] clinic.  Patient with left lower jaw toothache that began Tuesday.  She has noticed swelling over the past 2 days.  Patient reports some pain with swallowing but is able to drink water.  She is not having any difficulty speaking or breathing.  She is on warfarin.  Prior to Admission medications   Medication Sig Start Date End Date Taking? Authorizing Provider  acetaminophen  (TYLENOL ) 500 MG tablet Take 1 tablet (500 mg total) by mouth every 6 (six) hours as needed. 08/08/23   [name], [name], DO  albuterol  (VENTOLIN  HFA) 108 (90 Base) MCG/ACT inhaler Inhale 2 puffs into the lungs every 2 (two) hours as needed for wheezing or shortness of breath. 07/09/22   [name], MD  amiodarone  (PACERONE ) 200 MG tablet Take 1 tablet (200 mg total) by mouth daily. 08/06/23   [name], [name], NP  atorvastatin  (LIPITOR) 20 MG tablet TAKE ONE TABLET BY MOUTH ONCE DAILY. 04/30/23   Alvan Dorn FALCON, MD  benzonatate  (TESSALON ) 100 MG capsule Take 1 capsule (100 mg total) by mouth 3 (three) times daily as needed for cough. Do not take with alcohol  or while operating or driving heavy machinery 04/24/72   Grooms, Halsey, PA-C  digoxin  (LANOXIN ) 0.125 MG tablet Take 1 tablet (0.125 mg total) by mouth daily. 05/17/23   [name], [name], NP  Fe Fum-Vit C-Vit B12-FA (TRIGELS-F FORTE) CAPS capsule Take 1 capsule by mouth daily after breakfast. 08/06/23   [name], [name], NP  gabapentin  (NEURONTIN ) 300 MG capsule Take 1 capsule (300 mg total) by mouth 2 (two) times daily. 08/05/23   [name], [name], NP  losartan  (COZAAR ) 25 MG tablet Take 2 tablets (50 mg total) by mouth daily. 08/18/23   [name], MD  nicotine  (NICODERM CQ  - DOSED IN MG/24 HR) 7 mg/24hr patch Place 1  patch (7 mg total) onto the skin daily. 08/15/23   Grooms, [name], PA-C  pantoprazole  (PROTONIX ) 40 MG tablet Take 1 tablet (40 mg total) by mouth daily. 08/06/23   [name], [name], NP  potassium chloride  SA (KLOR-CON  M) 20 MEQ tablet Take 1 tablet (20 mEq total) by mouth daily. 08/05/23   [name], [name], NP  senna-docusate (SENOKOT-S) 8.6-50 MG tablet Take 1 tablet by mouth daily. 08/06/23   [name], [name], NP  spironolactone  (ALDACTONE ) 25 MG tablet Take 1 tablet (25 mg total) by mouth daily. 08/05/23   [name], [name], NP  torsemide  (DEMADEX ) 20 MG tablet Take 2 tablets (40 mg total) by mouth daily. 08/06/23   [name], [name], NP  warfarin (COUMADIN ) 5 MG tablet Take 7.5 mg (1.5 tablets) by mouth daily on Tuesdays, Thursdays, and Saturdays, then take 5 mg (1 tablet) by mouth on all other days. 08/05/23   [name], [name], NP    Allergies: Sulfa  antibiotics    Review of Systems  Updated Vital Signs Pulse 76   Resp 13   Ht 1.778 m (5' 10)   Wt 106.1 kg   SpO2 97%   BMI 33.58 kg/m   Physical Exam Vitals reviewed.  Constitutional:      Appearance: Normal appearance.  HENT:     Head: Normocephalic.     Right Ear: External ear normal.     Left Ear: External  ear normal.     Nose: Nose normal.     Mouth/Throat:     Pharynx: Oropharynx is clear.     Comments: Tenderness to palpation of left lower posterior molar without obvious decay Swelling to mouth more on left than right Submandibular swelling palpable with diffuse swelling and tenderness to left submandibular area Eyes:     Pupils: Pupils are equal, round, and reactive to light.  Cardiovascular:     Rate and Rhythm: Normal rate.  Pulmonary:     Effort: Pulmonary effort is normal.  Abdominal:     Palpations: Abdomen is soft.  Musculoskeletal:        General: Normal range of motion.     Cervical back: Normal range of motion.  Skin:    General: Skin is warm.     Capillary Refill: Capillary refill takes less than 2 seconds.  Neurological:      General: No focal deficit present.     Mental Status: She is alert.  Psychiatric:        Mood and Affect: Mood normal.     (all labs ordered are listed, but only abnormal results are displayed) Labs Reviewed  CBC WITH DIFFERENTIAL/PLATELET - Abnormal; Notable for the following components:      Result Value   RDW 16.9 (*)    Lymphs Abs 0.5 (*)    All other components within normal limits  PROTIME-INR - Abnormal; Notable for the following components:   Prothrombin Time 26.8 (*)    INR 2.3 (*)    All other components within normal limits  COMPREHENSIVE METABOLIC PANEL WITH GFR - Abnormal; Notable for the following components:   Sodium 134 (*)    Chloride 93 (*)    Total Protein 8.3 (*)    Albumin  2.8 (*)    Total Bilirubin 1.6 (*)    All other components within normal limits  I-STAT CHEM 8, ED - Abnormal; Notable for the following components:   Sodium 132 (*)    Potassium 8.0 (*)    Chloride 96 (*)    BUN 23 (*)    Calcium , Ion 0.98 (*)    TCO2 34 (*)    All other components within normal limits  CULTURE, BLOOD (ROUTINE X 2)  CULTURE, BLOOD (ROUTINE X 2)  LACTATE DEHYDROGENASE  I-STAT CHEM 8, ED    EKG: None  Radiology: CT Soft Tissue Neck W Contrast Result Date: 09/05/2023 EXAM: CT NECK WITH CONTRAST 09/05/2023 03:10:40 PM TECHNIQUE: CT of the neck was performed with the administration of intravenous contrast (75mL Omni 350). Multiplanar reformatted images are provided for review. Automated exposure control, iterative reconstruction, and/or weight based adjustment of the mA/kV was utilized to reduce the radiation dose to as low as reasonably achievable. COMPARISON: CT of the neck dated 10/10/2022. CLINICAL HISTORY: Soft tissue infection suspected, neck, xray done. Table formatting from the original note was not included. Patient comes in POV from LVAD appointment where she was getting her dressing changed. Patient has swelling and pain to her mouth related to a tooth. Doctor  there said she had an abscess and would likely need antibiotics and to see an oral surgeon so he told her to come here. FINDINGS: AERODIGESTIVE TRACT: There is some soft tissue swelling and enhancement involving the left palatine tonsils and pharyngeal mucosal space. There is no discrete abscess present, but there is soft tissue edema in the floor of the mouth on the left. SALIVARY GLANDS: There are a few mildly prominent left  submandibular and left level 2 lymph nodes. The nodes appear reactive. THYROID : There is an ovoid circumscribed lesion present within the inferior pole of the right lobe of the thyroid , measuring approximately 2.1 x 1.4 x 2.4 cm. There is also a round lesion within the left lobe measuring about 10 mm in diameter. LYMPH NODES: There are a few mildly prominent left submandibular and left level 2 lymph nodes. The nodes appear reactive. SOFT TISSUES: There is stranding of the subcutaneous fat in the left face. BRAIN, ORBITS, SINUSES AND MASTOIDS: No acute abnormality. LUNGS AND MEDIASTINUM: There is mild paraseptal emphysema present anteriorly within the upper lobes bilaterally. The patient is status post sternotomy. BONES: No focal bone abnormality. IMPRESSION: 1. Soft tissue swelling and enhancement involving the left palatine tonsils and pharyngeal mucosal space, with associated soft tissue edema in the floor of the mouth on the left and stranding of the subcutaneous fat in the left face. No discrete abscess. 2. Mildly prominent left submandibular and left level 2 lymph nodes, likely reactive. Electronically signed by: evalene coho 09/05/2023 03:30 PM EDT RP Workstation: [id number]     .Critical Care  Performed by: Levander Houston, MD Authorized by: Levander Houston, MD   Critical care provider statement:    Critical care time (minutes):  70   Critical care time was exclusive of:  Separately billable procedures and treating other patients and teaching time   Critical care was time  spent personally by me on the following activities:  Development of treatment plan with patient or surrogate, discussions with consultants, evaluation of patient's response to treatment, examination of patient, ordering and review of laboratory studies, ordering and review of radiographic studies, ordering and performing treatments and interventions, pulse oximetry, re-evaluation of patient's condition and review of old charts    Medications Ordered in the ED  Ampicillin -Sulbactam (UNASYN ) 3 g in sodium chloride  0.9 % 100 mL IVPB (has no administration in time range)  Ampicillin -Sulbactam (UNASYN ) 3 g in sodium chloride  0.9 % 100 mL IVPB (0 g Intravenous Stopped 09/05/23 1405)  morphine  (PF) 2 MG/ML injection 2 mg (2 mg Intravenous Given 09/05/23 1423)  iohexol  (OMNIPAQUE ) 350 MG/ML injection 75 mL (75 mLs Intravenous Contrast Given 09/05/23 1511)    Clinical Course as of 09/05/23 1625  Fri Sep 05, 2023  1403 Comprehensive metabolic panel with GFR [DR]    Clinical Course User Index [DR] Levander Houston, MD                                 Medical Decision Making Amount and/or Complexity of Data Reviewed Labs: ordered. Decision-making details documented in ED Course. Radiology: ordered.  Risk Prescription drug management.   1-left submandibular swelling and pain-concern for submandibular infection.  Patient had labs sent.  White count is normal.  She is not febrile.  CT scan is pending.  Differential diagnosis includes abscess, soft tissue infection without abscess, sialoadenitis, dental infection.  Patient received Unasyn . 2 patient has VAD and was sent from cardiology clinic.  They had reported that they spoke with Dr. Joanette, on call for oral surgery who agrees to see the patient.  Patient appears to be stable.  VAD coordinator paged 3 initial i-STAT with potassium of 8.  BUN/creatinine normal.  Suspect this is a hemolyzed specimen and will rerun.  CBC within normal limits  Care discussed  with Beckey Coe, nurse practitioner on-call for VAD VAD team will see and admit  Final diagnoses:  Acute on chronic combined systolic (congestive) and diastolic (congestive) heart failure (HCC)  LVAD (left ventricular assist device) present [facility])  On warfarin at home    ED Discharge Orders     None          Levander Houston, MD 09/05/23 1625

## 2023-09-06 ENCOUNTER — Inpatient Hospital Stay (HOSPITAL_COMMUNITY): Admitting: Certified Registered"

## 2023-09-06 ENCOUNTER — Other Ambulatory Visit: Payer: Self-pay

## 2023-09-06 ENCOUNTER — Encounter (HOSPITAL_COMMUNITY)
Admission: EM | Disposition: A | Discharge status: Home / Self Care | Payer: Self-pay | Source: Home / Self Care | Attending: Internal Medicine

## 2023-09-06 ENCOUNTER — Encounter (HOSPITAL_COMMUNITY): Payer: Self-pay | Admitting: Internal Medicine

## 2023-09-06 DIAGNOSIS — L0201 Cutaneous abscess of face: Secondary | ICD-10-CM | POA: Diagnosis not present

## 2023-09-06 DIAGNOSIS — I110 Hypertensive heart disease with heart failure: Secondary | ICD-10-CM | POA: Diagnosis not present

## 2023-09-06 DIAGNOSIS — I340 Nonrheumatic mitral (valve) insufficiency: Secondary | ICD-10-CM | POA: Diagnosis not present

## 2023-09-06 DIAGNOSIS — I5043 Acute on chronic combined systolic (congestive) and diastolic (congestive) heart failure: Secondary | ICD-10-CM | POA: Diagnosis not present

## 2023-09-06 HISTORY — PX: INCISION AND DRAINAGE ABSCESS: SHX5864

## 2023-09-06 LAB — CBC
HCT: 38.2 % (ref 36.0–46.0)
Hemoglobin: 12.2 g/dL (ref 12.0–15.0)
MCH: 27.6 pg (ref 26.0–34.0)
MCHC: 31.9 g/dL (ref 30.0–36.0)
MCV: 86.4 fL (ref 80.0–100.0)
Platelets: 150 K/uL (ref 150–400)
RBC: 4.42 MIL/uL (ref 3.87–5.11)
RDW: 17 % — ABNORMAL HIGH (ref 11.5–15.5)
WBC: 7.6 K/uL (ref 4.0–10.5)
nRBC: 0 % (ref 0.0–0.2)

## 2023-09-06 LAB — PROTIME-INR
INR: 2.4 — ABNORMAL HIGH (ref 0.8–1.2)
Prothrombin Time: 27.7 s — ABNORMAL HIGH (ref 11.4–15.2)

## 2023-09-06 LAB — BASIC METABOLIC PANEL WITH GFR
Anion gap: 9 (ref 5–15)
BUN: 13 mg/dL (ref 6–20)
CO2: 28 mmol/L (ref 22–32)
Calcium: 9.1 mg/dL (ref 8.9–10.3)
Chloride: 97 mmol/L — ABNORMAL LOW (ref 98–111)
Creatinine, Ser: 0.85 mg/dL (ref 0.44–1.00)
GFR, Estimated: >60 mL/min (ref >60)
Glucose, Bld: 89 mg/dL (ref 70–99)
Potassium: 3.4 mmol/L — ABNORMAL LOW (ref 3.5–5.1)
Sodium: 134 mmol/L — ABNORMAL LOW (ref 135–145)

## 2023-09-06 LAB — POCT PREGNANCY, URINE: Preg Test, Ur: NEGATIVE

## 2023-09-06 LAB — LACTATE DEHYDROGENASE: LDH: 139 U/L (ref 98–192)

## 2023-09-06 SURGERY — INCISION AND DRAINAGE, ABSCESS
Anesthesia: General | Laterality: Left

## 2023-09-06 MED ORDER — ACETAMINOPHEN 10 MG/ML IV SOLN
INTRAVENOUS | Status: DC | PRN
  Administered 2023-09-06: 1000 mg via INTRAVENOUS

## 2023-09-06 MED ORDER — DEXAMETHASONE SODIUM PHOSPHATE 10 MG/ML IJ SOLN
INTRAMUSCULAR | Status: AC
  Filled 2023-09-06: qty 1

## 2023-09-06 MED ORDER — MIDAZOLAM HCL 2 MG/2ML IJ SOLN
INTRAMUSCULAR | Status: DC | PRN
  Administered 2023-09-06 (×2): 1 mg via INTRAVENOUS

## 2023-09-06 MED ORDER — WARFARIN - PHARMACIST DOSING INPATIENT: Freq: Every day | Status: DC

## 2023-09-06 MED ORDER — BUPIVACAINE-EPINEPHRINE (PF) 0.25% -1:200000 IJ SOLN
INTRAMUSCULAR | Status: DC | PRN
  Administered 2023-09-06: 14 mL via INTRAMUSCULAR

## 2023-09-06 MED ORDER — ETOMIDATE 2 MG/ML IV SOLN
INTRAVENOUS | Status: AC
  Filled 2023-09-06: qty 20

## 2023-09-06 MED ORDER — ROCURONIUM BROMIDE 10 MG/ML (PF) SYRINGE
PREFILLED_SYRINGE | INTRAVENOUS | Status: DC | PRN
  Administered 2023-09-06: 50 mg via INTRAVENOUS

## 2023-09-06 MED ORDER — POTASSIUM CHLORIDE CRYS ER 20 MEQ PO TBCR: 60.0000 meq | EXTENDED_RELEASE_TABLET | Freq: Once | ORAL | Status: DC

## 2023-09-06 MED ORDER — LACTATED RINGERS IV SOLN: INTRAVENOUS | Status: DC

## 2023-09-06 MED ORDER — PROPOFOL 10 MG/ML IV BOLUS
INTRAVENOUS | Status: AC
  Filled 2023-09-06: qty 20

## 2023-09-06 MED ORDER — SUGAMMADEX SODIUM 200 MG/2ML IV SOLN
INTRAVENOUS | Status: DC | PRN
  Administered 2023-09-06: 200 mg via INTRAVENOUS

## 2023-09-06 MED ORDER — LIDOCAINE 2% (20 MG/ML) 5 ML SYRINGE
INTRAMUSCULAR | Status: DC | PRN
  Administered 2023-09-06: 40 mg via INTRAVENOUS

## 2023-09-06 MED ORDER — BUPIVACAINE-EPINEPHRINE (PF) 0.25% -1:200000 IJ SOLN
INTRAMUSCULAR | Status: AC
  Filled 2023-09-06: qty 30

## 2023-09-06 MED ORDER — ETOMIDATE 2 MG/ML IV SOLN
INTRAVENOUS | Status: DC | PRN
  Administered 2023-09-06: 20 mg via INTRAVENOUS

## 2023-09-06 MED ORDER — FENTANYL CITRATE PF 50 MCG/ML IJ SOSY: 25.0000 ug | PREFILLED_SYRINGE | INTRAMUSCULAR | Status: DC | PRN

## 2023-09-06 MED ORDER — 0.9 % SODIUM CHLORIDE (POUR BTL) OPTIME
TOPICAL | Status: DC | PRN
Start: 2023-09-06 — End: 2023-09-06
  Administered 2023-09-06: 1000 mL

## 2023-09-06 MED ORDER — ONDANSETRON HCL 4 MG/2ML IJ SOLN
INTRAMUSCULAR | Status: DC | PRN
  Administered 2023-09-06: 4 mg via INTRAVENOUS

## 2023-09-06 MED ORDER — MIDAZOLAM HCL 2 MG/2ML IJ SOLN
INTRAMUSCULAR | Status: AC
  Filled 2023-09-06: qty 2

## 2023-09-06 MED ORDER — OXYMETAZOLINE HCL 0.05 % NA SOLN
NASAL | Status: AC
  Filled 2023-09-06: qty 30

## 2023-09-06 MED ORDER — PHENYLEPHRINE 80 MCG/ML (10ML) SYRINGE FOR IV PUSH (FOR BLOOD PRESSURE SUPPORT)
PREFILLED_SYRINGE | INTRAVENOUS | Status: AC
  Filled 2023-09-06: qty 10

## 2023-09-06 MED ORDER — FENTANYL CITRATE (PF) 250 MCG/5ML IJ SOLN
INTRAMUSCULAR | Status: DC | PRN
  Administered 2023-09-06: 100 ug via INTRAVENOUS

## 2023-09-06 MED ORDER — CHLORHEXIDINE GLUCONATE 0.12 % MT SOLN
OROMUCOSAL | Status: AC
  Filled 2023-09-06: qty 15

## 2023-09-06 MED ORDER — POTASSIUM CHLORIDE 20 MEQ PO PACK
60.0000 meq | PACK | Freq: Once | ORAL | Status: AC
  Administered 2023-09-06: 60 meq via ORAL
  Filled 2023-09-06: qty 3

## 2023-09-06 MED ORDER — ORAL CARE MOUTH RINSE: 15.0000 mL | Freq: Once | OROMUCOSAL | Status: AC

## 2023-09-06 MED ORDER — BACITRACIN ZINC 500 UNIT/GM EX OINT
TOPICAL_OINTMENT | CUTANEOUS | Status: AC
  Filled 2023-09-06: qty 28.35

## 2023-09-06 MED ORDER — OXYMETAZOLINE HCL 0.05 % NA SOLN
NASAL | Status: AC
Start: 2023-09-06 — End: 2023-09-06
  Filled 2023-09-06: qty 30

## 2023-09-06 MED ORDER — ONDANSETRON HCL 4 MG/2ML IJ SOLN
INTRAMUSCULAR | Status: AC
  Filled 2023-09-06: qty 2

## 2023-09-06 MED ORDER — FENTANYL CITRATE (PF) 250 MCG/5ML IJ SOLN
INTRAMUSCULAR | Status: AC
  Filled 2023-09-06: qty 5

## 2023-09-06 MED ORDER — ROCURONIUM BROMIDE 10 MG/ML (PF) SYRINGE
PREFILLED_SYRINGE | INTRAVENOUS | Status: AC
  Filled 2023-09-06: qty 10

## 2023-09-06 MED ORDER — CHLORHEXIDINE GLUCONATE 0.12 % MT SOLN
15.0000 mL | Freq: Once | OROMUCOSAL | Status: AC
  Administered 2023-09-06: 15 mL via OROMUCOSAL

## 2023-09-06 MED ORDER — LIDOCAINE-EPINEPHRINE 1 %-1:100000 IJ SOLN
INTRAMUSCULAR | Status: AC
  Filled 2023-09-06: qty 1

## 2023-09-06 MED ORDER — DEXAMETHASONE SODIUM PHOSPHATE 10 MG/ML IJ SOLN
INTRAMUSCULAR | Status: DC | PRN
  Administered 2023-09-06: 10 mg via INTRAVENOUS

## 2023-09-06 MED ORDER — PHENYLEPHRINE 80 MCG/ML (10ML) SYRINGE FOR IV PUSH (FOR BLOOD PRESSURE SUPPORT)
PREFILLED_SYRINGE | INTRAVENOUS | Status: DC | PRN
  Administered 2023-09-06: 240 ug via INTRAVENOUS

## 2023-09-06 MED ORDER — LIDOCAINE 2% (20 MG/ML) 5 ML SYRINGE
INTRAMUSCULAR | Status: AC
  Filled 2023-09-06: qty 5

## 2023-09-06 MED ORDER — NOREPINEPHRINE 4 MG/250ML-% IV SOLN
INTRAVENOUS | Status: DC | PRN
  Administered 2023-09-06: 4 ug/min via INTRAVENOUS

## 2023-09-06 MED ORDER — WARFARIN SODIUM 3 MG PO TABS
3.0000 mg | ORAL_TABLET | Freq: Once | ORAL | Status: AC
  Administered 2023-09-06: 3 mg via ORAL
  Filled 2023-09-06 (×2): qty 1

## 2023-09-06 SURGICAL SUPPLY — 42 items
BAG COUNTER SPONGE SURGICOUNT (BAG) ×1 IMPLANT
BLADE SURG 15 STRL LF DISP TIS (BLADE) IMPLANT
BNDG GAUZE DERMACEA FLUFF 4 (GAUZE/BANDAGES/DRESSINGS) IMPLANT
CANISTER SUCTION 3000ML PPV (SUCTIONS) ×1 IMPLANT
CLEANER TIP ELECTROSURG 2X2 (MISCELLANEOUS) ×1 IMPLANT
COVER SURGICAL LIGHT HANDLE (MISCELLANEOUS) ×1 IMPLANT
DRAPE HALF SHEET 40X57 (DRAPES) IMPLANT
ELECT COATED BLADE 2.86 ST (ELECTRODE) ×1 IMPLANT
ELECT NDL TIP 2.8 STRL (NEEDLE) IMPLANT
ELECT NEEDLE TIP 2.8 STRL (NEEDLE) IMPLANT
ELECTRODE REM PT RTRN 9FT ADLT (ELECTROSURGICAL) ×1 IMPLANT
GAUZE SPONGE 4X4 12PLY STRL (GAUZE/BANDAGES/DRESSINGS) IMPLANT
GLOVE ORTHO TXT STRL SZ7.5 (GLOVE) ×1 IMPLANT
GOWN STRL REUS W/ TWL LRG LVL3 (GOWN DISPOSABLE) ×2 IMPLANT
GOWN STRL REUS W/ TWL XL LVL3 (GOWN DISPOSABLE) ×1 IMPLANT
KIT BASIN OR (CUSTOM PROCEDURE TRAY) ×1 IMPLANT
KIT TURNOVER KIT B (KITS) ×1 IMPLANT
NDL BLUNT 18X1 FOR OR ONLY (NEEDLE) ×1 IMPLANT
NDL HYPO 25GX1X1/2 BEV (NEEDLE) IMPLANT
NEEDLE BLUNT 18X1 FOR OR ONLY (NEEDLE) ×1 IMPLANT
NEEDLE HYPO 25GX1X1/2 BEV (NEEDLE) IMPLANT
NS IRRIG 1000ML POUR BTL (IV SOLUTION) ×1 IMPLANT
PAD ARMBOARD POSITIONER FOAM (MISCELLANEOUS) ×2 IMPLANT
PATTIES SURGICAL .5 X3 (DISPOSABLE) IMPLANT
PENCIL BUTTON HOLSTER BLD 10FT (ELECTRODE) ×1 IMPLANT
SCISSORS WIRE DISP (INSTRUMENTS) ×1 IMPLANT
STAPLER SKIN PROX 35W (STAPLE) ×1 IMPLANT
SUCTION TUBE FRAZIER 10FR DISP (SUCTIONS) IMPLANT
SUCTION TUBE FRAZIER 8FR DISP (SUCTIONS) IMPLANT
SUT BONE WAX W31G (SUTURE) IMPLANT
SUT CHROMIC 3 0 SH 27 (SUTURE) IMPLANT
SUT ETHILON 3 0 PS 1 (SUTURE) IMPLANT
SUT SILK 3 0 SH 30 (SUTURE) IMPLANT
SUT SILK 3-0 18XBRD TIE 12 (SUTURE) IMPLANT
SUT STEEL 0 18XMFL TIE 17 (SUTURE) IMPLANT
SUT STEEL 2 (SUTURE) IMPLANT
SUT VIC AB 3-0 FS2 27 (SUTURE) IMPLANT
SUT VIC AB 4-0 P-3 18X BRD (SUTURE) IMPLANT
SUT VIC AB 5-0 P-3 18XBRD (SUTURE) IMPLANT
SYR 50ML LL SCALE MARK (SYRINGE) ×1 IMPLANT
TRAY ENT MC OR (CUSTOM PROCEDURE TRAY) ×1 IMPLANT
WATER STERILE IRR 1000ML POUR (IV SOLUTION) ×1 IMPLANT

## 2023-09-06 NOTE — Consult Note (Addendum)
 Reason for Consult:Odontogenic abscess/cellulitis Referring Physician: ED  [name] [name] is an 48 y.o. female.  HPI: 48 year old female sent to the ED from [facility] clinic. Patient with left lower jaw toothache that began Tuesday. She has noticed swelling over the past 2 days. Patient reports some pain with swallowing but is able to drink water. She is on warfarin, that has been d/c'd and converted to heparin . Denies dyspnea/dysphagia. States that the swelling is a little better today, but sore.  Past Medical History:  Diagnosis Date   Anemia    Blood transfusion without reported diagnosis    CAD (coronary artery disease)    Nonobstructive   Closed left ankle fracture    August 30 2012   Clotting disorder [facility])    DVT (deep venous thrombosis) (HCC)    L leg   Fibroid tumor    HFrEF (heart failure with reduced ejection fraction) (HCC)    Hidradenitis    Hypertension    Mitral regurgitation    Myocardial infarction (HCC)    NICM (nonischemic cardiomyopathy) (HCC)    NSVT (nonsustained ventricular tachycardia) (HCC)    Obesity    OSA on CPAP    Pulmonary embolism (HCC) 04/2013   Sleep apnea     Past Surgical History:  Procedure Laterality Date   ABSCESS DRAINAGE Bilateral 01/10/2015   BREAST BIOPSY Right 02/01/2020   benign   COLONOSCOPY WITH PROPOFOL  N/A 06/08/2018   Procedure: COLONOSCOPY WITH PROPOFOL ;  Surgeon: Shaaron Lamar HERO, MD;  [facility];  Service: Endoscopy;  Laterality: N/A;  2:30pm   CYST REMOVAL TRUNK     IABP INSERTION N/A 07/09/2023   Procedure: IABP Insertion;  Surgeon: Wonda Sharper, MD;  Location: [facility] INVASIVE CV LAB;  Service: Cardiovascular;  Laterality: N/A;   INSERTION OF IMPLANTABLE LEFT VENTRICULAR ASSIST DEVICE N/A 07/24/2023   Procedure: INSERTION OF IMPLANTABLE LEFT VENTRICULAR ASSIST DEVICE USING HEARTMATE 3;  Surgeon: Lucas Dorise POUR, MD;  Location: [facility] OR;  Service: Open Heart Surgery;  Laterality: N/A;   INTRAOPERATIVE TRANSESOPHAGEAL  ECHOCARDIOGRAM N/A 07/10/2023   Procedure: ECHOCARDIOGRAM, TRANSESOPHAGEAL, INTRAOPERATIVE;  Surgeon: Lucas Dorise POUR, MD;  Location: [facility] OR;  Service: Open Heart Surgery;  Laterality: N/A;   IR FLUORO GUIDE CV LINE RIGHT  05/16/2023   IR US  GUIDE VASC ACCESS RIGHT  05/16/2023   IVC FILTER PLACEMENT ([facility] HX)     LOWER EXTREMITY VENOGRAPHY N/A 02/07/2021   Procedure: LOWER EXTREMITY VENOGRAPHY;  Surgeon: Lanis Fonda BRAVO, MD;  Location: [facility] INVASIVE CV LAB;  Service: Cardiovascular;  Laterality: N/A;   MITRAL VALVE REPLACEMENT N/A 07/24/2023   Procedure: MITRAL VALVE REPLACEMENT USING MEDTRONIC MOSAIC PORCINE BIOPROSTHESIS MITRAL VALVE SIZE ;  Surgeon: Lucas Dorise POUR, MD;  Location: [facility] OR;  Service: Open Heart Surgery;  Laterality: N/A;   PERIPHERAL VASCULAR THROMBECTOMY N/A 02/07/2021   Procedure: PERIPHERAL VASCULAR THROMBECTOMY;  Surgeon: Lanis Fonda BRAVO, MD;  Location: [facility] INVASIVE CV LAB;  Service: Cardiovascular;  Laterality: N/A;   PLACEMENT OF IMPELLA LEFT VENTRICULAR ASSIST DEVICE Left 07/10/2023   Procedure: INSERTION, CARDIAC ASSIST DEVICE, IMPELLA 5.5;  Surgeon: Lucas Dorise POUR, MD;  [facility];  Service: Open Heart Surgery;  Laterality: Left;   REMOVAL OF IMPELLA LEFT VENTRICULAR ASSIST DEVICE  07/24/2023   Procedure: REMOVAL, CARDIAC ASSIST DEVICE, IMPELLA 5.5;  Surgeon: Lucas Dorise POUR, MD;  Location: [facility] OR;  Service: Open Heart Surgery;;   RIGHT HEART CATH N/A 03/08/2022   Procedure: RIGHT HEART CATH;  Surgeon: Gardenia Led, DO;  [facility] INVAS[facility] CV LAB;  Service: Cardiovascular;  Laterality: N/A;   RIGHT HEART CATH N/A 10/04/2022   Procedure: RIGHT HEART CATH;  Surgeon: Gardenia Led, DO;  [facility] INVAS[facility] CV LAB;  Service: Cardiovascular;  Laterality: N/A;   RIGHT HEART CATH N/A 05/13/2023   Procedure: RIGHT HEART CATH;  Surgeon: [name], MD;  Location: [facility] INVASIVE CV LAB;  Service: Cardiovascular;  Laterality: N/A;   RIGHT HEART CATH N/A  06/02/2023   Procedure: RIGHT HEART CATH;  Surgeon: Cherrie Toribio SAUNDERS, MD;  [facility] INVAS[facility] CV LAB;  Service: Cardiovascular;  Laterality: N/A;   RIGHT/LEFT HEART CATH AND CORONARY ANGIOGRAPHY N/A 06/19/2021   Procedure: RIGHT/LEFT HEART CATH AND CORONARY ANGIOGRAPHY;  Surgeon: Claudene Victory ORN, MD;  [facility] INVAS[facility] CV LAB;  Service: Cardiovascular;  Laterality: N/A;   TEE WITHOUT CARDIOVERSION N/A 03/08/2022   Procedure: TRANSESOPHAGEAL ECHOCARDIOGRAM (TEE);  Surgeon: Gardenia Led, DO;  [facility];  Service: Cardiovascular;  Laterality: N/A;   TEE WITHOUT CARDIOVERSION N/A 07/24/2023   Procedure: ECHOCARDIOGRAM, TRANSESOPHAGEAL;  Surgeon: Lucas Dorise POUR, MD;  [facility];  Service: Open Heart Surgery;  Laterality: N/A;   TRANSCATHETER MITRAL EDGE TO EDGE REPAIR N/A 07/09/2023   Procedure: TRANSCATHETER MITRAL EDGE TO EDGE REPAIR;  Surgeon: Wonda Sharper, MD;  Location: [facility] INVASIVE CV LAB;  Service: Cardiovascular;  Laterality: N/A;   TRANSESOPHAGEAL ECHOCARDIOGRAM (CATH LAB) N/A 06/02/2023   Procedure: TRANSESOPHAGEAL ECHOCARDIOGRAM;  Surgeon: Cherrie Toribio SAUNDERS, MD;  [facility] LAB;  Service: Cardiovascular;  Laterality: N/A;   TRANSESOPHAGEAL ECHOCARDIOGRAM (CATH LAB) N/A 07/09/2023   Procedure: TRANSESOPHAGEAL ECHOCARDIOGRAM;  Surgeon: Wonda Sharper, MD;  Location: [facility] INVASIVE CV LAB;  Service: Cardiovascular;  Laterality: N/A;    Family History  Problem Relation Age of Onset   Hypertension Mother    Diabetes Paternal Aunt    Diabetes Paternal Uncle    Other Father        blood clots   Cancer Maternal Grandmother    Heart attack Paternal Grandmother    Colon cancer Neg Hx     Social History:  reports that she has been smoking cigarettes. She has never been exposed to tobacco smoke. She has never used smokeless tobacco. She reports current alcohol  use of about 2.0 standard drinks of alcohol  per week. She reports that she does not use  drugs.  Allergies:  Allergies  Allergen Reactions   Sulfa  Antibiotics Itching, Swelling and Rash    Lip swelling    Medications: I have reviewed the patient's current medications.  Results for orders placed or performed during the hospital encounter of 09/05/23 (from the past 48 hours)  CBC with Differential     Status: Abnormal   Collection Time: 09/05/23 11:43 AM  Result Value Ref Range   WBC 8.9 4.0 - 10.5 K/uL   RBC 4.76 3.87 - 5.11 MIL/uL   Hemoglobin 12.8 12.0 - 15.0 g/dL   HCT 58.2 63.9 - 53.9 %   MCV 87.6 80.0 - 100.0 fL   MCH 26.9 26.0 - 34.0 pg   MCHC 30.7 30.0 - 36.0 g/dL   RDW 83.0 (H) 88.4 - 84.4 %   Platelets 159 150 - 400 K/uL   nRBC 0.0 0.0 - 0.2 %   Neutrophils Relative % 84 %   Neutro Abs 7.6 1.7 - 7.7 K/uL   Lymphocytes Relative 6 %   Lymphs Abs 0.5 (L) 0.7 - 4.0 K/uL   Monocytes Relative 7 %   Monocytes  Absolute 0.7 0.1 - 1.0 K/uL   Eosinophils Relative 1 %   Eosinophils Absolute 0.1 0.0 - 0.5 K/uL   Basophils Relative 1 %   Basophils Absolute 0.0 0.0 - 0.1 K/uL   Immature Granulocytes 1 %   Abs Immature Granulocytes 0.04 0.00 - 0.07 K/uL    Comment: Performed at [facility], [address] [place], [place] [address]  Protime-INR     Status: Abnormal   Collection Time: 09/05/23 11:43 AM  Result Value Ref Range   Prothrombin Time 26.8 (H) 11.4 - 15.2 seconds   INR 2.3 (H) 0.8 - 1.2    Comment: (NOTE) INR goal varies based on device and disease states. Performed at [facility], [address], [place], [place] [address]   Blood culture (routine x 2)     Status: None (Preliminary result)   Collection Time: 09/05/23 11:43 AM   Specimen: BLOOD  Result Value Ref Range   Specimen Description BLOOD LEFT ANTECUBITAL    Special Requests      BOTTLES DRAWN AEROBIC AND ANAEROBIC Blood Culture adequate volume   Culture      NO GROWTH < 24 HOURS Performed at [facility], [address] [place], [place] [address]    Report Status  PENDING   Lactate dehydrogenase     Status: None   Collection Time: 09/05/23 12:04 PM  Result Value Ref Range   LDH 183 98 - 192 U/L    Comment: Performed at [facility], [address], [place], [place] [address]  Comprehensive metabolic panel with GFR     Status: Abnormal   Collection Time: 09/05/23 12:04 PM  Result Value Ref Range   Sodium 134 (L) 135 - 145 mmol/L   Potassium 4.0 3.5 - 5.1 mmol/L   Chloride 93 (L) 98 - 111 mmol/L   CO2 29 22 - 32 mmol/L   Glucose, Bld 87 70 - 99 mg/dL    Comment: Glucose reference range applies only to samples taken after fasting for at least 8 hours.   BUN 15 6 - 20 mg/dL   Creatinine, Ser 9.06 0.44 - 1.00 mg/dL   Calcium  9.5 8.9 - 10.3 mg/dL   Total Protein 8.3 (H) 6.5 - 8.1 g/dL   Albumin  2.8 (L) 3.5 - 5.0 g/dL   AST 29 15 - 41 U/L   ALT 17 0 - 44 U/L   Alkaline Phosphatase 126 38 - 126 U/L   Total Bilirubin 1.6 (H) 0.0 - 1.2 mg/dL   GFR, Estimated >39 >39 mL/min    Comment: (NOTE) Calculated using the CKD-EPI Creatinine Equation (2021)    Anion gap 12 5 - 15    Comment: Performed at [facility], [address], [place], [place] [address]  I-stat chem 8, ED (not at [facility], [facility] or [facility])     Status: Abnormal   Collection Time: 09/05/23  2:13 PM  Result Value Ref Range   Sodium 132 (L) 135 - 145 mmol/L   Potassium 8.0 (HH) 3.5 - 5.1 mmol/L   Chloride 96 (L) 98 - 111 mmol/L   BUN 23 (H) 6 - 20 mg/dL   Creatinine, Ser 8.99 0.44 - 1.00 mg/dL   Glucose, Bld 82 70 - 99 mg/dL    Comment: Glucose reference range applies only to samples taken after fasting for at least 8 hours.   Calcium , Ion 0.98 (L) 1.15 - 1.40 mmol/L   TCO2 34 (H) 22 -  32 mmol/L   Hemoglobin 13.9 12.0 - 15.0 g/dL   HCT 58.9 63.9 - 53.9 %   Comment NOTIFIED PHYSICIAN   MRSA Next Gen by PCR, Nasal     Status: None   Collection Time: 09/05/23  7:16 PM   Specimen: Nasal Mucosa; Nasal Swab  Result Value Ref Range   MRSA by PCR Next Gen NOT DETECTED NOT DETECTED     Comment: (NOTE) The GeneXpert MRSA Assay (FDA approved for NASAL specimens only), is one component of a comprehensive MRSA colonization surveillance program. It is not intended to diagnose MRSA infection nor to guide or monitor treatment for MRSA infections. Test performance is not FDA approved in patients less than 64 years old. Performed at [facility], [address] [place], [place] [address]   Lactate dehydrogenase     Status: None   Collection Time: 09/06/23  2:29 AM  Result Value Ref Range   LDH 139 98 - 192 U/L    Comment: Performed at [facility], [address] [place], [place] [address]  Protime-INR     Status: Abnormal   Collection Time: 09/06/23  2:29 AM  Result Value Ref Range   Prothrombin Time 27.7 (H) 11.4 - 15.2 seconds   INR 2.4 (H) 0.8 - 1.2    Comment: (NOTE) INR goal varies based on device and disease states. Performed at [facility], [address] [place], [place] [address]   CBC     Status: Abnormal   Collection Time: 09/06/23  2:29 AM  Result Value Ref Range   WBC 7.6 4.0 - 10.5 K/uL   RBC 4.42 3.87 - 5.11 MIL/uL   Hemoglobin 12.2 12.0 - 15.0 g/dL   HCT 61.7 63.9 - 53.9 %   MCV 86.4 80.0 - 100.0 fL   MCH 27.6 26.0 - 34.0 pg   MCHC 31.9 30.0 - 36.0 g/dL   RDW 82.9 (H) 88.4 - 84.4 %   Platelets 150 150 - 400 K/uL   nRBC 0.0 0.0 - 0.2 %    Comment: Performed at [facility], [address], [place], [place] [address]  Basic metabolic panel     Status: Abnormal   Collection Time: 09/06/23  2:29 AM  Result Value Ref Range   Sodium 134 (L) 135 - 145 mmol/L   Potassium 3.4 (L) 3.5 - 5.1 mmol/L   Chloride 97 (L) 98 - 111 mmol/L   CO2 28 22 - 32 mmol/L   Glucose, Bld 89 70 - 99 mg/dL    Comment: Glucose reference range applies only to samples taken after fasting for at least 8 hours.   BUN 13 6 - 20 mg/dL   Creatinine, Ser 9.14 0.44 - 1.00 mg/dL   Calcium  9.1 8.9 - 10.3 mg/dL   GFR, Estimated >39 >39 mL/min    Comment:  (NOTE) Calculated using the CKD-EPI Creatinine Equation (2021)    Anion gap 9 5 - 15    Comment: Performed at [facility], [address] [place], [place] [address]   *Note: Due to a large number of results and/or encounters for the requested time period, some results have not been displayed. A complete set of results can be found in Results Review.    CT Soft Tissue Neck W Contrast Result Date: 09/05/2023 EXAM: CT NECK WITH CONTRAST 09/05/2023 03:10:40 PM TECHNIQUE: CT of the neck was performed with the administration of intravenous contrast (75mL Omni 350). Multiplanar reformatted  images are provided for review. Automated exposure control, iterative reconstruction, and/or weight based adjustment of the mA/kV was utilized to reduce the radiation dose to as low as reasonably achievable. COMPARISON: CT of the neck dated 10/10/2022. CLINICAL HISTORY: Soft tissue infection suspected, neck, xray done. Table formatting from the original note was not included. Patient comes in POV from LVAD appointment where she was getting her dressing changed. Patient has swelling and pain to her mouth related to a tooth. Doctor there said she had an abscess and would likely need antibiotics and to see an oral surgeon so he told her to come here. FINDINGS: AERODIGESTIVE TRACT: There is some soft tissue swelling and enhancement involving the left palatine tonsils and pharyngeal mucosal space. There is no discrete abscess present, but there is soft tissue edema in the floor of the mouth on the left. SALIVARY GLANDS: There are a few mildly prominent left submandibular and left level 2 lymph nodes. The nodes appear reactive. THYROID : There is an ovoid circumscribed lesion present within the inferior pole of the right lobe of the thyroid , measuring approximately 2.1 x 1.4 x 2.4 cm. There is also a round lesion within the left lobe measuring about 10 mm in diameter. LYMPH NODES: There are a few mildly prominent left  submandibular and left level 2 lymph nodes. The nodes appear reactive. SOFT TISSUES: There is stranding of the subcutaneous fat in the left face. BRAIN, ORBITS, SINUSES AND MASTOIDS: No acute abnormality. LUNGS AND MEDIASTINUM: There is mild paraseptal emphysema present anteriorly within the upper lobes bilaterally. The patient is status post sternotomy. BONES: No focal bone abnormality. IMPRESSION: 1. Soft tissue swelling and enhancement involving the left palatine tonsils and pharyngeal mucosal space, with associated soft tissue edema in the floor of the mouth on the left and stranding of the subcutaneous fat in the left face. No discrete abscess. 2. Mildly prominent left submandibular and left level 2 lymph nodes, likely reactive. Electronically signed by: evalene coho 09/05/2023 03:30 PM EDT RP Workstation: [id number]    Review of Systems: other than HPI, neg Blood pressure (!) 96/32, pulse 75, temperature 98.8 F (37.1 C), temperature source Oral, resp. rate 17, height 5' 10 (1.778 m), weight 98.7 kg, SpO2 97%. Physical Exam: Gen: awake, alert nad HEENT: perrl, mod left submandibular edema, firm, tender. Does not seem to cross the midline. Mod trismus. No FOM edema, oropharynx clear, uvula midline. There are grossly carious teeth #12, 18 with vestibular edema adjacent to #18.  Assessment/Plan: 48 y/o female with HF, CAD,obesity with left submandibular abscess/cellulitis, carious/nonrestorable teeth #12,18. No discrete abscess noted on CT scan. Recommend I&D of left submandibular cellulitis/abscess with extraction of teeth #12,18 under general anesthesia. R/B/A discussed with the patient. Per patient, she has been NPO for the past 2 days, only water. Continue IV Unasyn .  **POSTOP Recommendations: Penrose drain in left submandibular space will be removed in 2-3 days. Intraoral gauze pressure for hemostasis. Continue IV Unasyn  while inpatient, then convert to Augmentin  - 10 day course  total. Chlorhexidine  oral rinses TID x 10 days. Mechanical soft diet as tolerated.  *Thank you for this consult. Please contact Dr. Joanette at [phone] with questions/concerns.  Eva Joanette, DMD Oral & Maxillofacial Surgery 09/06/2023, 9:16 AM

## 2023-09-06 NOTE — Op Note (Signed)
 09/06/2023  12:22 PM  PATIENT:  [name] [name]  48 y.o. female  PRE-OPERATIVE DIAGNOSIS:  LEFT FACIAL ABCESS  POST-OPERATIVE DIAGNOSIS:  LEFT FACIAL ABCSESS  PROCEDURE:   Incision and Drainage of left submandibular space abscess/cellulitis Surgical extraction of teeth #12,18  SURGEON:  Surgeons and Role:    * Gwendolynn Merkey, Eva, DMD - Primary  ANESTHESIA:   general  EBL:  10mL  BLOOD ADMINISTERED:none  DRAINS: Penrose drain in the left submandibular area   Operative Findings: left submandibular swelling more cellulitic  Complications: none  Indication for procedure:  Patient is a 48 year old female with 2-day history of left facial/submandibular swelling that is progressively gotten worse secondary to odontogenic source requiring incision and drainage and removal of nonrestorable teeth.  Procedure: The patient was identified in the preoperative holding by both anesthesia and the maxillofacial teams.  Health history was reviewed.  Consent was verified.  The patient was brought back to the operating room placed in the table in the supine position.  Standard ASA leads and monitors were placed.  The cardiac team was present as well to monitor LVAD device.  The patient was preoxygenated, induced, and her airway was protected with the oral endotracheal tube.  The tube was taped and secured by the anesthesia care team.  A throat pack was placed and the patient was prepped and draped for a standard maxillofacial procedure.  Local anesthetic was placed in the left maxilla and mandible.  Attention was first directed to tooth #12 where a 15 blade was used to make a sulcular incision.  A full-thickness flap was elevated.  The tooth was sectioned with a handpiece under irrigation.  The tooth and the roots were then removed in multiple pieces in their entirety.  The site was thoroughly curetted and irrigated.  Gelfoam was placed.  The tissues were reapproximated with 3-0 Chromic Gut  sutures.  Attention was then directed to tooth #18 where a 15 blade was used to make a sulcular incision.  A full-thickness flap was elevated.  The tooth was sectioned with a handpiece under irrigation.  The tooth and the roots were then removed in their entirety.  The site was thoroughly curetted and irrigated.  The adjacent impacted wisdom tooth was not visualized. Gelfoam was placed.  The tissues were reapproximated with 3-0 Chromic Gut sutures.  Attention was then directed extraorally, where an area was marked in the submandibular area 2 cm below the inferior border.  A 15 blade was used to make a 1 cm incision through the skin and subcutaneous tissue.  A Kelly hemostat was used to bluntly advance into the wound to the inferior border of the mandible and into the submandibular space.  There was noted hemorrhage but no obvious purulence.  The wound was then thoroughly irrigated with saline.  1/4 inch Penrose drain was then placed into the submandibular space and secured with a 3-0 silk suture.  A dressing was then placed to this wound.  At this point the entire oral cavity was then thoroughly irrigated.  The throat pack was removed.  The patient was then returned to the anesthesia care team where she was extubated without event.  She was transported to the postanesthesia care unit for recovery, and will be admitted for continued observation/IV antibiotics.  *See consult note for postop recommendations.  Eva LITTIE Hutching, DMD Oral & Maxillofacial Surgery

## 2023-09-06 NOTE — Plan of Care (Signed)
   Problem: Education: Goal: Patient will understand all VAD equipment and how it functions Outcome: Progressing   Problem: Cardiac: Goal: LVAD will function as expected and patient will experience no clinical alarms Outcome: Progressing   Problem: Education: Goal: Knowledge of General Education information will improve Description: Including pain rating scale, medication(s)/side effects and non-pharmacologic comfort measures Outcome: Progressing   Problem: Health Behavior/Discharge Planning: Goal: Ability to manage health-related needs will improve Outcome: Progressing   Problem: Clinical Measurements: Goal: Ability to maintain clinical measurements within normal limits will improve Outcome: Progressing

## 2023-09-06 NOTE — Transfer of Care (Signed)
 Immediate Anesthesia Transfer of Care Note  Patient: [name] [name]  Procedure(s) Performed: INCISION AND DRAINAGE, ABSCESS (Left)  Patient Location: PACU  Anesthesia Type:General  Level of Consciousness: awake and alert   Airway & Oxygen Therapy: Patient Spontanous Breathing  Post-op Assessment: Report given to RN and Post -op Vital signs reviewed and stable  Post vital signs: Reviewed and stable  Last Vitals:  Vitals Value Taken Time  BP 95/75 09/06/23 12:32  Temp    Pulse 78 09/06/23 12:35  Resp 20 09/06/23 12:35  SpO2 99 % 09/06/23 12:35  Vitals shown include unfiled device data.  Last Pain:  Vitals:   09/06/23 1057  TempSrc: Oral  PainSc: 8          Complications: No notable events documented.

## 2023-09-06 NOTE — Anesthesia Procedure Notes (Signed)
 Procedure Name: Intubation Date/Time: 09/06/2023 11:42 AM  Performed by: Roddie Grate, CRNAPre-anesthesia Checklist: Patient identified, Emergency Drugs available, Suction available, Patient being monitored and Timeout performed Patient Re-evaluated:Patient Re-evaluated prior to induction Oxygen Delivery Method: Circle system utilized Preoxygenation: Pre-oxygenation with 100% oxygen Induction Type: IV induction Ventilation: Mask ventilation without difficulty Laryngoscope Size: 4 and Glidescope Grade View: Grade I Tube type: Oral Rae Tube size: 7.0 mm Number of attempts: 1 Airway Equipment and Method: Stylet Placement Confirmation: ETT inserted through vocal cords under direct vision, positive ETCO2 and breath sounds checked- equal and bilateral Secured at: 23 cm Tube secured with: Tape Dental Injury: Teeth and Oropharynx as per pre-operative assessment  Comments: Elective Glidescope for jaw swelling and limited mouth opening due to facial abscess. Smooth IV Induction. Eyes taped. Easy mask. DL x 1 with grade 1 view. Atraumatically placed, teeth and lip remain intact as pre-op. Secured with tape. Bilateral breath sounds +/=, EtCO2 +, Adequate TV, VSS.

## 2023-09-06 NOTE — Progress Notes (Signed)
 PHARMACY - ANTICOAGULATION CONSULT NOTE  Pharmacy Consult for warfarin / heparin   Indication: LVAD HM III  Allergies  Allergen Reactions   Sulfa  Antibiotics Itching, Swelling and Rash    Lip swelling    Patient Measurements: Height: 5' 10 (177.8 cm) Weight: 98.7 kg (217 lb 8 oz) IBW/kg (Calculated) : 68.5 HEPARIN  DW (KG): 90.3  Vital Signs: Temp: 98.2 F (36.8 C) (08/16 1302) Temp Source: Oral (08/16 1057) BP: 91/77 (08/16 1302) Pulse Rate: 79 (08/16 1302)  Labs: Recent Labs    09/05/23 1011 09/05/23 1143 09/05/23 1204 09/05/23 1413 09/06/23 0229  HGB 13.1 12.8  --  13.9 12.2  HCT 41.4 41.7  --  41.0 38.2  PLT 153 159  --   --  150  LABPROT  --  26.8*  --   --  27.7*  INR  --  2.3*  --   --  2.4*  CREATININE  --   --  0.93 1.00 0.85    Estimated Creatinine Clearance: 105.2 mL/min (by C-G formula based on SCr of 0.85 mg/dL).   Medical History: Past Medical History:  Diagnosis Date   Anemia    Blood transfusion without reported diagnosis    CAD (coronary artery disease)    Nonobstructive   Closed left ankle fracture    August 30 2012   Clotting disorder (HCC)    DVT (deep venous thrombosis) (HCC)    L leg   Fibroid tumor    HFrEF (heart failure with reduced ejection fraction) (HCC)    Hidradenitis    Hypertension    LVAD (left ventricular assist device) present [facility])    Mitral regurgitation    Myocardial infarction (HCC)    NICM (nonischemic cardiomyopathy) (HCC)    NSVT (nonsustained ventricular tachycardia) (HCC)    Obesity    OSA on CPAP    Pulmonary embolism (HCC) 04/2013   Sleep apnea     Assessment: [name] [name] with HF s/p LVAD HM III implant 07/24/23 on warfarin PTA 7.5mg  TTSa/5mg  MWFSu admit INR 2.3 at goal. Admit with facial swelling and teeth abscess - on unasyn .   INR is therapeutic at 2.4 - held dose last night. Hgb 12.2, plt 150. LDH 139. No s/sx of bleeding. Underwent dental surgery today with I&D L submandibular abscess and extraction of  2 teeth.   Goal of Therapy:  INR 2-2.5 Monitor platelets by anticoagulation protocol: Yes   Plan:  Discussed with MD - will give lower than PTA warfarin dose tonight of 3 mg tonight to keep in goal range  Monitor daily INR, CBC, and for s/sx of bleeding   Thank you for allowing pharmacy to participate in this patient's care,  Suzen Sour, PharmD, BCCCP Clinical Pharmacist  Phone: [phone] 09/06/2023 1:56 PM  Please check AMION for all [facility] Pharmacy phone numbers After 10:00 PM, call Main Pharmacy [phone]

## 2023-09-06 NOTE — Brief Op Note (Signed)
 09/06/2023  12:22 PM  PATIENT:  [name] [name]  48 y.o. female  PRE-OPERATIVE DIAGNOSIS:  LEFT FACIAL ABCESS  POST-OPERATIVE DIAGNOSIS:  LEFT FACIAL ABCSESS  PROCEDURE:   Incision and Drainage of left submandibular space abscess/cellulitis Surgical extraction of teeth #12,18  SURGEON:  Surgeons and Role:    * Illyria Sobocinski, Eva, DMD - Primary  ANESTHESIA:   general  EBL:  10mL  BLOOD ADMINISTERED:none  DRAINS: Penrose drain in the left submandibular area   LOCAL MEDICATIONS USED:  LIDOCAINE    SPECIMEN:  No Specimen  DISPOSITION OF SPECIMEN:  N/A  COUNTS:  YES  TOURNIQUET:  * No tourniquets in log *  DICTATION: .Dragon Dictation  PLAN OF CARE: Admit to inpatient   PATIENT DISPOSITION:  PACU - hemodynamically stable.   Delay start of Pharmacological VTE agent (>24hrs) due to surgical blood loss or risk of bleeding: no

## 2023-09-06 NOTE — Anesthesia Preprocedure Evaluation (Signed)
 Anesthesia Evaluation  Patient identified by MRN, date of birth, ID band Patient awake    Reviewed: Allergy & Precautions, H&P , NPO status , Patient's Chart, lab work & pertinent test results  Airway Mallampati: II   Neck ROM: full    Dental   Pulmonary sleep apnea , Current Smoker and Patient abstained from smoking.   breath sounds clear to auscultation       Cardiovascular hypertension, +CHF  + dysrhythmias + Valvular Problems/Murmurs  Rhythm:regular Rate:Normal  S/p MVR and LVAD placement.  LVEF 25-30%.   Neuro/Psych    GI/Hepatic   Endo/Other    Renal/GU      Musculoskeletal   Abdominal   Peds  Hematology   Anesthesia Other Findings   Reproductive/Obstetrics                              Anesthesia Physical Anesthesia Plan  ASA: 4  Anesthesia Plan: General   Post-op Pain Management:    Induction: Intravenous  PONV Risk Score and Plan: 2 and Ondansetron , Dexamethasone , Midazolam  and Treatment may vary due to age or medical condition  Airway Management Planned: Oral ETT and Video Laryngoscope Planned  Additional Equipment:   Intra-op Plan:   Post-operative Plan: Extubation in OR  Informed Consent: I have reviewed the patients History and Physical, chart, labs and discussed the procedure including the risks, benefits and alternatives for the proposed anesthesia with the patient or authorized representative who has indicated his/her understanding and acceptance.     Dental advisory given  Plan Discussed with: CRNA, Anesthesiologist and Surgeon  Anesthesia Plan Comments:         Anesthesia Quick Evaluation

## 2023-09-06 NOTE — Progress Notes (Signed)
 Advanced Heart Failure VAD Team Note  PCP-Cardiologist: Ria Commander, DO   Subjective:   Chief Complaint: dental abscess  Admitted with dental abscess. On Unasyn . Feels swelling is improved. Still having some pain.   About to go to OR    LVAD INTERROGATION:  HeartMate 3 LVAD:   Flow 4.8 liters/min, speed 6000, power 5.0, PI 2.9.   VAD interrogated personally. Parameters stable.   Objective:    Vital Signs:   Temp:  [97.6 F (36.4 C)-99.2 F (37.3 C)] 98.2 F (36.8 C) (08/16 1302) Pulse Rate:  [67-80] 79 (08/16 1302) Resp:  [13-20] 20 (08/16 1302) BP: (72-126)/(30-97) 91/77 (08/16 1302) SpO2:  [95 %-97 %] 95 % (08/16 1302) Weight:  [98.7 kg-101.2 kg] 98.7 kg (08/16 0500) Last BM Date : 09/04/23 Mean arterial Pressure 80s  Intake/Output:   Intake/Output Summary (Last 24 hours) at 09/06/2023 1351 Last data filed at 09/06/2023 1225 Gross per 24 hour  Intake 1040 ml  Output 10 ml  Net 1030 ml     Physical Exam    General:  Sitting up in bed  No resp difficulty HEENT: normal swelling of left face Neck: supple. JVP flat . Carotids 2+ bilat; no bruits. No lymphadenopathy or thyromegaly appreciated. Cor: Mechanical heart sounds with LVAD hum present. Lungs: clear Abdomen: obese soft, nontender, nondistended. No hepatosplenomegaly. No bruits or masses. Good bowel sounds. Driveline: C/D/I; securement device intact and driveline incorporated Extremities: no cyanosis, clubbing, rash, edema Neuro: alert & orientedx3, cranial nerves grossly intact. moves all 4 extremities w/o difficulty. Affect pleasant   Telemetry   Sinus 70-80s Personally reviewed   Labs   Basic Metabolic Panel: Recent Labs  Lab 09/05/23 1204 09/05/23 1413 09/06/23 0229  NA 134* 132* 134*  K 4.0 8.0* 3.4*  CL 93* 96* 97*  CO2 29  --  28  GLUCOSE 87 82 89  BUN 15 23* 13  CREATININE 0.93 1.00 0.85  CALCIUM  9.5  --  9.1    Liver Function Tests: Recent Labs  Lab 09/05/23 1204   AST 29  ALT 17  ALKPHOS 126  BILITOT 1.6*  PROT 8.3*  ALBUMIN  2.8*   No results for input(s): LIPASE, AMYLASE in the last 168 hours. No results for input(s): AMMONIA in the last 168 hours.  CBC: Recent Labs  Lab 09/05/23 1011 09/05/23 1143 09/05/23 1413 09/06/23 0229  WBC 8.4 8.9  --  7.6  NEUTROABS  --  7.6  --   --   HGB 13.1 12.8 13.9 12.2  HCT 41.4 41.7 41.0 38.2  MCV 86.1 87.6  --  86.4  PLT 153 159  --  150    INR: Recent Labs  Lab 09/05/23 1143 09/06/23 0229  INR 2.3* 2.4*    Other results:  Imaging   CT Soft Tissue Neck W Contrast Result Date: 09/05/2023 EXAM: CT NECK WITH CONTRAST 09/05/2023 03:10:40 PM TECHNIQUE: CT of the neck was performed with the administration of intravenous contrast (75mL Omni 350). Multiplanar reformatted images are provided for review. Automated exposure control, iterative reconstruction, and/or weight based adjustment of the mA/kV was utilized to reduce the radiation dose to as low as reasonably achievable. COMPARISON: CT of the neck dated 10/10/2022. CLINICAL HISTORY: Soft tissue infection suspected, neck, xray done. Table formatting from the original note was not included. Patient comes in POV from LVAD appointment where she was getting her dressing changed. Patient has swelling and pain to her mouth related to a tooth. Doctor there said she had  an abscess and would likely need antibiotics and to see an oral surgeon so he told her to come here. FINDINGS: AERODIGESTIVE TRACT: There is some soft tissue swelling and enhancement involving the left palatine tonsils and pharyngeal mucosal space. There is no discrete abscess present, but there is soft tissue edema in the floor of the mouth on the left. SALIVARY GLANDS: There are a few mildly prominent left submandibular and left level 2 lymph nodes. The nodes appear reactive. THYROID : There is an ovoid circumscribed lesion present within the inferior pole of the right lobe of the thyroid ,  measuring approximately 2.1 x 1.4 x 2.4 cm. There is also a round lesion within the left lobe measuring about 10 mm in diameter. LYMPH NODES: There are a few mildly prominent left submandibular and left level 2 lymph nodes. The nodes appear reactive. SOFT TISSUES: There is stranding of the subcutaneous fat in the left face. BRAIN, ORBITS, SINUSES AND MASTOIDS: No acute abnormality. LUNGS AND MEDIASTINUM: There is mild paraseptal emphysema present anteriorly within the upper lobes bilaterally. The patient is status post sternotomy. BONES: No focal bone abnormality. IMPRESSION: 1. Soft tissue swelling and enhancement involving the left palatine tonsils and pharyngeal mucosal space, with associated soft tissue edema in the floor of the mouth on the left and stranding of the subcutaneous fat in the left face. No discrete abscess. 2. Mildly prominent left submandibular and left level 2 lymph nodes, likely reactive. Electronically signed by: timothy berrigan 09/05/2023 03:30 PM EDT RP Workstation: [id number]     Medications:     Scheduled Medications:  [MAR Hold] amiodarone   200 mg Oral Daily   [MAR Hold] atorvastatin   20 mg Oral Daily   [MAR Hold] digoxin   0.125 mg Oral Daily   [MAR Hold] Fe Fum-Vit C-Vit B12-FA  1 capsule Oral QPC breakfast   [MAR Hold] gabapentin   300 mg Oral BID   [MAR Hold] losartan   50 mg Oral Daily   [MAR Hold] nicotine   7 mg Transdermal Daily   [MAR Hold] pantoprazole   40 mg Oral Daily   [MAR Hold] senna-docusate  1 tablet Oral Daily   [MAR Hold] spironolactone   25 mg Oral Daily   [MAR Hold] torsemide   40 mg Oral Daily    Infusions:  [MAR Hold] ampicillin -sulbactam (UNASYN ) IV 3 g (09/06/23 0924)   lactated ringers  10 mL/hr at 09/06/23 1111    PRN Medications: [MAR Hold] acetaminophen , [MAR Hold] albuterol , [MAR Hold] oxyCODONE     Assessment/Plan:    1. Dental abscess 2/2 dental caries - Orthopantogram 07/17/23 showed: Dental caries within the crown of tooth # 18  and tooth # 12. Indeterminate 11 mm lytic lesion within the maxilla overlying the crown of tooth # 16. Treatment deferred at that time with urgent need for LVAD. - CT neck + for abscess - Improving with Unasyn   - For OR today with Dr. Joanette - Continue oxycodone  for pain    Chronic systolic HF with biventricular/stage D CM s/p HM III 07/29/23 with concurrent MVR - Stable NYHA class II - Appears euvolemic on exam.  Hold torsemide  post-op  - Continue losartan  to 50mg  daily, MAPs ok  - Continue spironolactone  25mg  daily - Continue digoxin  0.125, check level tomorrow morning - INR 2.4, goal 2-3, given hypercoaguable d/o want to keep INR > 2.0. Discussed with pharmD will given warfarin 3mg  today - LDH 183   Severe MR s/p MVR - Echo 4/25 with severe MR - TEER 6/25 complicated by worsening MR - Now  s/p MVR   Hx PE/DVT w/ IVC filter - Evaluated at [facility] where thrombophilia work-up was negative (Lupus inhibitor, anticardiolipin antibodies, and anti beta-2  glycoprotein antibodies were negative; protein C activity 85%, protein S activity 76%, factor V Leiden, and prothrombin gene mutation ) - IVC filter in place; could not be removed due to significant thrombus burden on filter at [facility].  - See warfarin dosing above   CAD - LHC in 5/23 w/ nonobstructive CAD involving RCA - Inferior WMA on most recent echo - No s/s angina - Continue statin    Length of Stay: 1  Toribio Fuel, MD 09/06/2023, 1:51 PM  VAD Team --- VAD ISSUES ONLY--- Pager [phone] (7am - 7am)  Advanced Heart Failure Team  Pager [phone] (M-F; 7a - 5p)  Please contact [facility] for night-coverage after hours (5p -7a ) and weekends on [email or web address]

## 2023-09-06 NOTE — Progress Notes (Signed)
 VAD Coordinator Procedure Note:   VAD Coordinator met patient in 2C. Pt undergoing I&D of tooth abscess with extraction per Dr. Joanette. Hemodynamics and VAD parameters monitored by myself and anesthesia throughout the procedure. Blood pressures were obtained with automatic cuff on right arm.    Time: Doppler Auto  BP Flow PI Power Speed  Pre-procedure:  1111  126/97(105) 5.0 2.9 4.8 6000   1125  88/63(68) 5.0 2.8 4.8 6000           Sedation Induction: 1140  90/62(71) 5.2 1.8 4.8 6000   1200  88/69(77) 5.0 3.4 4.8 6000   1215  97/85(91) 4.8 3.2 4.8 6000           Recovery Area: 1235  95/75(82) 4.9 3.6 4.8 6000   1302  91/77 (84) 4.9 3.6 4.9 6000    Patient tolerated the procedure well. VAD Coordinator accompanied and remained with patient in recovery area.  Extraction of tooth #12 and #18 with a penrose drain placement to the left submandibular space. Post op recommendations placed in Dr. Alayne consult note.  Patient Disposition: 2C02 report given to Lauraine PEAK.

## 2023-09-07 LAB — BASIC METABOLIC PANEL WITH GFR
Anion gap: 11 (ref 5–15)
BUN: 19 mg/dL (ref 6–20)
CO2: 27 mmol/L (ref 22–32)
Calcium: 9.1 mg/dL (ref 8.9–10.3)
Chloride: 99 mmol/L (ref 98–111)
Creatinine, Ser: 1.26 mg/dL — ABNORMAL HIGH (ref 0.44–1.00)
GFR, Estimated: 53 mL/min — ABNORMAL LOW (ref >60)
Glucose, Bld: 131 mg/dL — ABNORMAL HIGH (ref 70–99)
Potassium: 4.1 mmol/L (ref 3.5–5.1)
Sodium: 137 mmol/L (ref 135–145)

## 2023-09-07 LAB — CBC
HCT: 39.9 % (ref 36.0–46.0)
Hemoglobin: 12.4 g/dL (ref 12.0–15.0)
MCH: 26.9 pg (ref 26.0–34.0)
MCHC: 31.1 g/dL (ref 30.0–36.0)
MCV: 86.6 fL (ref 80.0–100.0)
Platelets: 189 K/uL (ref 150–400)
RBC: 4.61 MIL/uL (ref 3.87–5.11)
RDW: 16.8 % — ABNORMAL HIGH (ref 11.5–15.5)
WBC: 9.8 K/uL (ref 4.0–10.5)
nRBC: 0 % (ref 0.0–0.2)

## 2023-09-07 LAB — PROTIME-INR
INR: 2.7 — ABNORMAL HIGH (ref 0.8–1.2)
Prothrombin Time: 30.2 s — ABNORMAL HIGH (ref 11.4–15.2)

## 2023-09-07 LAB — LACTATE DEHYDROGENASE: LDH: 152 U/L (ref 98–192)

## 2023-09-07 MED ORDER — WARFARIN SODIUM 5 MG PO TABS
5.0000 mg | ORAL_TABLET | Freq: Once | ORAL | Status: AC
  Administered 2023-09-07: 5 mg via ORAL
  Filled 2023-09-07: qty 1

## 2023-09-07 NOTE — Plan of Care (Signed)
  Problem: Education: Goal: Patient will understand all VAD equipment and how it functions Outcome: Progressing   Problem: Cardiac: Goal: LVAD will function as expected and patient will experience no clinical alarms Outcome: Progressing   Problem: Education: Goal: Knowledge of General Education information will improve Description: Including pain rating scale, medication(s)/side effects and non-pharmacologic comfort measures Outcome: Progressing   Problem: Clinical Measurements: Goal: Ability to maintain clinical measurements within normal limits will improve Outcome: Progressing

## 2023-09-07 NOTE — Plan of Care (Signed)
  Problem: Education: Goal: Patient will understand all VAD equipment and how it functions Outcome: Progressing   Problem: Cardiac: Goal: LVAD will function as expected and patient will experience no clinical alarms Outcome: Progressing   Problem: Education: Goal: Knowledge of General Education information will improve Description: Including pain rating scale, medication(s)/side effects and non-pharmacologic comfort measures Outcome: Progressing

## 2023-09-07 NOTE — Progress Notes (Signed)
 Advanced Heart Failure VAD Team Note  PCP-Cardiologist: Ria Commander, DO   Subjective:   Chief Complaint: dental abscess  Reports swelling and pain are improving, was able to eat mac and cheese last night, eggs this morning. Oral surgery following along, will pull drain in the next few days. No other complaints.     LVAD INTERROGATION:  HeartMate 3 LVAD:   Flow 5 liters/min, speed 6000, power 5, PI 3.4.   VAD interrogated personally. Parameters stable. Rare PI events   Objective:    Vital Signs:   Temp:  [97.6 F (36.4 C)-98.9 F (37.2 C)] 98.7 F (37.1 C) (08/17 1126) Pulse Rate:  [71-79] 76 (08/17 0430) Resp:  [18-20] 19 (08/17 1126) BP: (86-122)/(54-94) 112/78 (08/17 1126) SpO2:  [95 %-99 %] 97 % (08/17 1126) Weight:  [100.2 kg] 100.2 kg (08/17 0430) Last BM Date : 09/04/23 Mean arterial Pressure 80s  Intake/Output:   Intake/Output Summary (Last 24 hours) at 09/07/2023 1235 Last data filed at 09/07/2023 0440 Gross per 24 hour  Intake 720 ml  Output 2100 ml  Net -1380 ml     Physical Exam    Physical Exam: GENERAL: no acute distress. HEENT: Dressing an drain in place, left sided swelling persistent CARDIAC:  Mechanical heart sounds with LVAD hum present. Clear S1 and S2, nonpalpable pulse  LUNGS:  Clear to auscultation bilaterally.  ABDOMEN:  Soft, round, nontender, positive bowel sounds x4.     LVAD exit site:   Dressing dry and intact.  No erythema or drainage.  Stabilization device present and accurately applied.  EXTREMITIES:  Warm and dry, no cyanosis, clubbing, rash or edema    Telemetry   Sinus 80s   Labs   Basic Metabolic Panel: Recent Labs  Lab 09/05/23 1204 09/05/23 1413 09/06/23 0229 09/07/23 0220  NA 134* 132* 134* 137  K 4.0 8.0* 3.4* 4.1  CL 93* 96* 97* 99  CO2 29  --  28 27  GLUCOSE 87 82 89 131*  BUN 15 23* 13 19  CREATININE 0.93 1.00 0.85 1.26*  CALCIUM  9.5  --  9.1 9.1    Liver Function Tests: Recent Labs  Lab  09/05/23 1204  AST 29  ALT 17  ALKPHOS 126  BILITOT 1.6*  PROT 8.3*  ALBUMIN  2.8*   No results for input(s): LIPASE, AMYLASE in the last 168 hours. No results for input(s): AMMONIA in the last 168 hours.  CBC: Recent Labs  Lab 09/05/23 1011 09/05/23 1143 09/05/23 1413 09/06/23 0229 09/07/23 0220  WBC 8.4 8.9  --  7.6 9.8  NEUTROABS  --  7.6  --   --   --   HGB 13.1 12.8 13.9 12.2 12.4  HCT 41.4 41.7 41.0 38.2 39.9  MCV 86.1 87.6  --  86.4 86.6  PLT 153 159  --  150 189    INR: Recent Labs  Lab 09/05/23 1143 09/06/23 0229 09/07/23 0220  INR 2.3* 2.4* 2.7*    Other results:  Medications:     Scheduled Medications:  amiodarone   200 mg Oral Daily   atorvastatin   20 mg Oral Daily   digoxin   0.125 mg Oral Daily   Fe Fum-Vit C-Vit B12-FA  1 capsule Oral QPC breakfast   gabapentin   300 mg Oral BID   losartan   50 mg Oral Daily   nicotine   7 mg Transdermal Daily   pantoprazole   40 mg Oral Daily   senna-docusate  1 tablet Oral Daily   spironolactone   25  mg Oral Daily   Warfarin - Pharmacist Dosing Inpatient   Does not apply q1600    Infusions:  ampicillin -sulbactam (UNASYN ) IV 3 g (09/07/23 0934)    PRN Medications: acetaminophen , albuterol , fentaNYL  (SUBLIMAZE ) injection, oxyCODONE     Assessment/Plan:    Dental abscess 2/2 dental caries - Orthopantogram 07/17/23 showed: Dental caries within the crown of tooth # 18 and tooth # 12. Indeterminate 11 mm lytic lesion within the maxilla overlying the crown of tooth # 16. Treatment deferred at that time with urgent need for LVAD. - CT neck + for abscess - Improving with Unasyn   - OR 8/16 with Dr. Joanette - Penrose drain in place, oral surgery following - Continue oxycodone  for pain    Chronic systolic HF with biventricular/stage D CM s/p HM III 07/29/23 with concurrent MVR - Stable NYHA class II - Appears euvolemic on exam.  Hold torsemide  post-op. May be able to restart Monday if needed - Continue  losartan  50mg  daily - Continue spironolactone  25mg  daily - Continue digoxin  0.125, check level tomorrow morning - INR 2.7, goal 2-3, discussed with pharmD who will adjust their parameters   Severe MR s/p MVR - Echo 4/25 with severe MR - TEER 6/25 complicated by worsening MR - Now s/p MVR   Hx PE/DVT w/ IVC filter - Evaluated at [facility] where thrombophilia work-up was negative (Lupus inhibitor, anticardiolipin antibodies, and anti beta-2  glycoprotein antibodies were negative; protein C activity 85%, protein S activity 76%, factor V Leiden, and prothrombin gene mutation ) - IVC filter in place; could not be removed due to significant thrombus burden on filter at [facility].  - See warfarin dosing above   CAD - LHC in 5/23 w/ nonobstructive CAD involving RCA - Inferior WMA on most recent echo - No s/s angina - Continue statin    Length of Stay: 2  Morene JINNY Brownie, MD 09/07/2023, 12:35 PM  VAD Team --- VAD ISSUES ONLY--- Pager [phone] (7am - 7am)  Advanced Heart Failure Team  Pager [phone] (M-F; 7a - 5p)  Please contact [facility] for night-coverage after hours (5p -7a ) and weekends on [email or web address]

## 2023-09-07 NOTE — Progress Notes (Addendum)
 PHARMACY - ANTICOAGULATION CONSULT NOTE  Pharmacy Consult for warfarin / heparin   Indication: LVAD HM III  Allergies  Allergen Reactions   Sulfa  Antibiotics Itching, Swelling and Rash    Lip swelling    Patient Measurements: Height: 5' 10 (177.8 cm) Weight: 100.2 kg (220 lb 14.4 oz) IBW/kg (Calculated) : 68.5 HEPARIN  DW (KG): 90.3  Vital Signs: Temp: 98.7 F (37.1 C) (08/17 0720) Temp Source: Oral (08/17 0720) BP: 96/68 (08/17 0720) Pulse Rate: 76 (08/17 0430)  Labs: Recent Labs    09/05/23 1143 09/05/23 1204 09/05/23 1413 09/06/23 0229 09/07/23 0220  HGB 12.8  --  13.9 12.2 12.4  HCT 41.7  --  41.0 38.2 39.9  PLT 159  --   --  150 189  LABPROT 26.8*  --   --  27.7* 30.2*  INR 2.3*  --   --  2.4* 2.7*  CREATININE  --    < > 1.00 0.85 1.26*   < > = values in this interval not displayed.    Estimated Creatinine Clearance: 71.5 mL/min (A) (by C-G formula based on SCr of 1.26 mg/dL (H)).   Medical History: Past Medical History:  Diagnosis Date   Anemia    Blood transfusion without reported diagnosis    CAD (coronary artery disease)    Nonobstructive   Closed left ankle fracture    August 30 2012   Clotting disorder (HCC)    DVT (deep venous thrombosis) (HCC)    L leg   Fibroid tumor    HFrEF (heart failure with reduced ejection fraction) (HCC)    Hidradenitis    Hypertension    LVAD (left ventricular assist device) present [facility])    Mitral regurgitation    Myocardial infarction (HCC)    NICM (nonischemic cardiomyopathy) (HCC)    NSVT (nonsustained ventricular tachycardia) (HCC)    Obesity    OSA on CPAP    Pulmonary embolism (HCC) 04/2013   Sleep apnea     Assessment: 47yof with HF s/p LVAD HM III implant 07/24/23 on warfarin PTA 7.5mg  TTSa/5mg  MWFSu admit INR 2.3 at goal. Admit with facial swelling and teeth abscess - on unasyn .   INR is supratherapeutic at 2.7 - dose held on 8/15 and received lower than home dose last night. Hgb 12.4, plt 189. LDH  152. No s/sx of bleeding. Didn't have much oral intake until later last night with procedure > has been eating well today.  Goal of Therapy:  INR 2-2.5 > increase to 2-3 per MD  Monitor platelets by anticoagulation protocol: Yes   Plan:  Will order warfarin 5 mg tonight (as PTA dose) Monitor daily INR, CBC, and for s/sx of bleeding   Thank you for allowing pharmacy to participate in this patient's care,  Suzen Sour, PharmD, BCCCP Clinical Pharmacist  Phone: [phone] 09/07/2023 8:26 AM  Please check AMION for all [facility] Pharmacy phone numbers After 10:00 PM, call Main Pharmacy [phone]

## 2023-09-08 ENCOUNTER — Encounter (HOSPITAL_COMMUNITY): Payer: Self-pay | Admitting: Oncology

## 2023-09-08 ENCOUNTER — Encounter (HOSPITAL_COMMUNITY): Payer: Self-pay | Admitting: Oral Surgery

## 2023-09-08 ENCOUNTER — Other Ambulatory Visit (HOSPITAL_COMMUNITY): Payer: Self-pay

## 2023-09-08 LAB — DIGOXIN LEVEL: Digoxin Level: 0.7 ng/mL — ABNORMAL LOW (ref 0.8–2.0)

## 2023-09-08 LAB — BASIC METABOLIC PANEL WITH GFR
Anion gap: 4 — ABNORMAL LOW (ref 5–15)
BUN: 21 mg/dL — ABNORMAL HIGH (ref 6–20)
CO2: 28 mmol/L (ref 22–32)
Calcium: 8.6 mg/dL — ABNORMAL LOW (ref 8.9–10.3)
Chloride: 102 mmol/L (ref 98–111)
Creatinine, Ser: 1.17 mg/dL — ABNORMAL HIGH (ref 0.44–1.00)
GFR, Estimated: 58 mL/min — ABNORMAL LOW (ref >60)
Glucose, Bld: 102 mg/dL — ABNORMAL HIGH (ref 70–99)
Potassium: 3.6 mmol/L (ref 3.5–5.1)
Sodium: 134 mmol/L — ABNORMAL LOW (ref 135–145)

## 2023-09-08 LAB — CBC
HCT: 36.5 % (ref 36.0–46.0)
Hemoglobin: 11.3 g/dL — ABNORMAL LOW (ref 12.0–15.0)
MCH: 27.1 pg (ref 26.0–34.0)
MCHC: 31 g/dL (ref 30.0–36.0)
MCV: 87.5 fL (ref 80.0–100.0)
Platelets: 151 K/uL (ref 150–400)
RBC: 4.17 MIL/uL (ref 3.87–5.11)
RDW: 16.7 % — ABNORMAL HIGH (ref 11.5–15.5)
WBC: 8.6 K/uL (ref 4.0–10.5)
nRBC: 0 % (ref 0.0–0.2)

## 2023-09-08 LAB — PROTIME-INR
INR: 3.3 — ABNORMAL HIGH (ref 0.8–1.2)
Prothrombin Time: 35.3 s — ABNORMAL HIGH (ref 11.4–15.2)

## 2023-09-08 LAB — LACTATE DEHYDROGENASE: LDH: 143 U/L (ref 98–192)

## 2023-09-08 MED ORDER — WARFARIN SODIUM 5 MG PO TABS: ORAL_TABLET | ORAL | Status: DC

## 2023-09-08 MED ORDER — CHLORHEXIDINE GLUCONATE 0.12 % MT SOLN: 15.0000 mL | Freq: Three times a day (TID) | OROMUCOSAL | 0 refills | Status: DC

## 2023-09-08 MED ORDER — AMOXICILLIN-POT CLAVULANATE 875-125 MG PO TABS
1.0000 | ORAL_TABLET | Freq: Two times a day (BID) | ORAL | 0 refills | Status: DC
  Filled 2023-09-08: qty 20, 10d supply, fill #0

## 2023-09-08 MED ORDER — TORSEMIDE 20 MG PO TABS: 40.0000 mg | ORAL_TABLET | Freq: Every day | ORAL | Status: DC

## 2023-09-08 MED ORDER — OXYCODONE HCL 5 MG PO TABS
5.0000 mg | ORAL_TABLET | ORAL | 0 refills | Status: DC | PRN
  Filled 2023-09-08: qty 15, 3d supply, fill #0

## 2023-09-08 MED ORDER — CHLORHEXIDINE GLUCONATE 0.12 % MT SOLN: 15.0000 mL | Freq: Three times a day (TID) | OROMUCOSAL | Status: DC

## 2023-09-08 MED ORDER — POTASSIUM CHLORIDE CRYS ER 20 MEQ PO TBCR: 20.0000 meq | EXTENDED_RELEASE_TABLET | ORAL | Status: DC

## 2023-09-08 MED ORDER — WARFARIN SODIUM 5 MG PO TABS: 5.0000 mg | ORAL_TABLET | Freq: Every day | ORAL | Status: DC

## 2023-09-08 NOTE — Progress Notes (Signed)
 Subjective: 48 y/o F POD2 s/p I&D left submandibular space abscess/cellulitis, extraction of teeth #12,18. C/o neck soreness and inability to open. Denies dyspnea/dysphagia.   Objective: Vital signs in last 24 hours: Temp:  [97.7 F (36.5 C)-98.8 F (37.1 C)] 97.7 F (36.5 C) (08/18 0803) Pulse Rate:  [65-74] 65 (08/18 0803) Resp:  [12-19] 14 (08/18 0803) BP: (76-114)/(58-92) 114/92 (08/18 0803) SpO2:  [97 %-98 %] 98 % (08/18 0803) Weight:  [103.5 kg] 103.5 kg (08/18 0639) Wt Readings from Last 1 Encounters:  09/08/23 103.5 kg    Intake/Output from previous day: 08/17 0701 - 08/18 0700 In: 240 [P.O.:240] Out: 1325 [Urine:1325] Intake/Output this shift: No intake/output data recorded.  Physical Exam:  Gen: awake,alert, nad HEENT: perrl. Mild-mod left submandibular edema, firm, tender. Penrose drain appears non-productive and was removed. Mod trismus. Intraoral wounds clean/hemostatic. No FOM edema. Mucous membranes moist. Oropharynx clear.   Recent Labs    09/07/23 0220 09/08/23 0315  WBC 9.8 8.6  HGB 12.4 11.3*  HCT 39.9 36.5  PLT 189 151    Recent Labs    09/07/23 0220 09/08/23 0315  NA 137 134*  K 4.1 3.6  CL 99 102  CO2 27 28  GLUCOSE 131* 102*  BUN 19 21*  CREATININE 1.26* 1.17*  CALCIUM  9.1 8.6*    Medications: I have reviewed the patient's current medications.  Assessment/Plan: 48 y/o F POD2 s/p I&D left submandibular space abscess/cellulitis, extraction of teeth #12,18. Progressing well. Submandibular edema as expected post-op. Should improve now that drain is removed. Discussed hygiene. Discussed range of motion exercises for trimsus.   **Recommendations: Continue IV Unasyn  while inpatient, then convert to Augmentin  - 10 day course total. Chlorhexidine  oral rinses TID x 10 days. Keep bandage to left cervical wound. Will allow to heal by secondary intention. Mechanical soft diet as tolerated. Stable from a maxillofacial standpoint for discharge.  Patient can follow up at [facility] in 7-10 days, or sooner if symptoms are not improving. She can call [phone] to schedule.   *Thank you for this consult. Please contact Dr. Joanette at [phone] with questions/concerns.   LOS: 3 days   Eva Joanette, DMD Oral & Maxillofacial Surgery 09/08/2023, 9:18 AM

## 2023-09-08 NOTE — TOC Initial Note (Addendum)
 Transition of Care [facility]) - Initial/Assessment Note    Patient Details  Name: [name] [name] MRN: [id number] Date of Birth: 1975-03-28  Transition of Care [facility]) CM/SW Contact:    Justina Delcia Czar, RN Phone Number: [phone] 09/08/2023, 12:09 PM  Clinical Narrative:                  Spoke to pt and mother at bedside. Pt states he use Medicaid transportation or her Aunt will take her to appts. Pt has Living Better with HF booklet at home and scale for daily weights.   Pt has Medicaid [place] [facility] services. Spoke to [employer], [place]. States HH was not started. Patient declined HH. Pt has Rollator and scale at home.   Scheduled PCP appt for 09/17/2023 at 1:20 pm.    Expected Discharge Plan: Home/Self Care Barriers to Discharge: No Barriers Identified   Patient Goals and CMS Choice Patient states their goals for this hospitalization and ongoing recovery are:: wants to go home          Expected Discharge Plan and Services   Discharge Planning Services: CM Consult   Living arrangements for the past 2 months: Single Family Home Expected Discharge Date: 09/08/23                                    Prior Living Arrangements/Services Living arrangements for the past 2 months: Single Family Home Lives with:: Parents, Minor Children Patient language and need for interpreter reviewed:: Yes Do you feel safe going back to the place where you live?: Yes      Need for Family Participation in Patient Care: No (Comment) Care giver support system in place?: Yes (comment) Current home services: DME (CPAP, scale) Criminal Activity/Legal Involvement Pertinent to Current Situation/Hospitalization: No - Comment as needed  Activities of Daily Living      Permission Sought/Granted Permission sought to share information with : Family Supports, Case Manager, PCP Permission granted to share information with : Yes, Verbal Permission Granted  Share Information with NAME: [name]  Permission granted to share info w AGENCY: Home Health, DME, PCP  Permission granted to share info w Relationship: mother  Permission granted to share info w Contact Information: [phone]  Emotional Assessment Appearance:: Appears stated age Attitude/Demeanor/Rapport: Engaged Affect (typically observed): Accepting Orientation: : Oriented to Self, Oriented to Place, Oriented to  Time, Oriented to Situation   Psych Involvement: No (comment)  Admission diagnosis:  Complex dental caries [K02.9] Acute on chronic combined systolic (congestive) and diastolic (congestive) heart failure (HCC) [I50.43] LVAD (left ventricular assist device) present (HCC) [Z95.811] On warfarin at home [Z79.01] Patient Active Problem List   Diagnosis Date Noted   Complex dental caries 09/05/2023   LVAD (left ventricular assist device) present (HCC) 07/24/2023   Severe mitral regurgitation 07/09/2023   Heart failure (HCC) 07/09/2023   Prediabetes 05/26/2023   Cardiogenic shock (HCC) 05/07/2023   Acute on chronic combined systolic (congestive) and diastolic (congestive) heart failure (HCC) 05/06/2023   HFrEF (heart failure with reduced ejection fraction) (HCC) 07/08/2022   Closed cervical spine fracture/C2 09/22/2021   Obesity (BMI 30-39.9) 09/22/2021   Essential hypertension 06/05/2021   Lumbar degenerative disc disease 12/14/2018   Chronic anticoagulation 03/24/2018   S/P IVC filter 12/01/2017   Chronic bilateral low back pain with bilateral sciatica 12/01/2017   Chronic pain syndrome 12/01/2017   Uterine leiomyoma 08/18/2014  Severe Iron  deficiency anemia 02/05/2014   Secondary pulmonary hypertension 07/06/2013   OSA on CPAP 07/06/2013   Tobacco use disorder 04/27/2013   PCP:  Mancil Pfeiffer, PA-C Pharmacy:   [facility] [place], [place] - [address] [place] [address] Phone: [phone] Fax: [phone]     Social Drivers of Health  (SDOH) Social History: SDOH Screenings   Food Insecurity: No Food Insecurity (09/07/2023)  Housing: Unknown (09/07/2023)  Transportation Needs: No Transportation Needs (09/07/2023)  Utilities: Not At Risk (09/07/2023)  Alcohol  Screen: Low Risk  (04/03/2023)  Depression (PHQ2-9): Low Risk  (08/15/2023)  Financial Resource Strain: Low Risk  (08/11/2023)  Physical Activity: Insufficiently Active (04/03/2023)  Social Connections: Socially Isolated (08/11/2023)  Stress: No Stress Concern Present (04/03/2023)  Tobacco Use: High Risk (09/06/2023)   SDOH Interventions:     Readmission Risk Interventions    07/03/2022   12:12 PM 02/13/2022   12:41 PM 12/31/2021    1:52 PM  Readmission Risk Prevention Plan  Transportation Screening Complete Complete   HRI or Home Care Consult Complete Complete Complete  Social Work Consult for Recovery Care Planning/Counseling Complete Complete Complete  Palliative Care Screening Not Applicable Not Applicable Not Applicable  Medication Review Oceanographer) Complete Complete Complete

## 2023-09-08 NOTE — Plan of Care (Signed)
  Problem: Education: Goal: Patient will understand all VAD equipment and how it functions Outcome: Progressing   Problem: Cardiac: Goal: LVAD will function as expected and patient will experience no clinical alarms Outcome: Progressing   Problem: Education: Goal: Knowledge of General Education information will improve Description: Including pain rating scale, medication(s)/side effects and non-pharmacologic comfort measures Outcome: Progressing

## 2023-09-08 NOTE — Progress Notes (Signed)
 PHARMACY - ANTICOAGULATION CONSULT NOTE  Pharmacy Consult for warfarin  Indication: LVAD HM III  Allergies  Allergen Reactions   Sulfa  Antibiotics Itching, Swelling and Rash    Lip swelling    Patient Measurements: Height: 5' 10 (177.8 cm) Weight: 103.5 kg (228 lb 2.8 oz) IBW/kg (Calculated) : 68.5 HEPARIN  DW (KG): 90.3  Vital Signs: Temp: 97.7 F (36.5 C) (08/18 0803) Temp Source: Oral (08/18 0803) BP: 114/92 (08/18 0803) Pulse Rate: 65 (08/18 0803)  Labs: Recent Labs    09/06/23 0229 09/07/23 0220 09/08/23 0315  HGB 12.2 12.4 11.3*  HCT 38.2 39.9 36.5  PLT 150 189 151  LABPROT 27.7* 30.2* 35.3*  INR 2.4* 2.7* 3.3*  CREATININE 0.85 1.26* 1.17*    Estimated Creatinine Clearance: 78.2 mL/min (A) (by C-G formula based on SCr of 1.17 mg/dL (H)).   Medical History: Past Medical History:  Diagnosis Date   Anemia    Blood transfusion without reported diagnosis    CAD (coronary artery disease)    Nonobstructive   Closed left ankle fracture    August 30 2012   Clotting disorder (HCC)    DVT (deep venous thrombosis) (HCC)    L leg   Fibroid tumor    HFrEF (heart failure with reduced ejection fraction) (HCC)    Hidradenitis    Hypertension    LVAD (left ventricular assist device) present [facility])    Mitral regurgitation    Myocardial infarction (HCC)    NICM (nonischemic cardiomyopathy) (HCC)    NSVT (nonsustained ventricular tachycardia) (HCC)    Obesity    OSA on CPAP    Pulmonary embolism (HCC) 04/2013   Sleep apnea     Assessment: 47yof with HF s/p LVAD HM III implant 07/24/23 on warfarin PTA 7.5mg  TTSa/5mg  MWFSu admit INR 2.3 at goal. Admit with facial swelling and teeth abscess - on unasyn .   INR is supratherapeutic at 3.3 - dose held on 8/15 and received lower than home dose 8/16 cbc stable post teeth extraction. LDH 150s stable No s/sx of bleeding. Didn't have much oral intake over weekend with procedure - now improving  Will keep warfarin  conservative dosing at DC with diet and in combo with antibiotics   Goal of Therapy:  INR 2-2.5 > increase to 2-3 per MD  Monitor platelets by anticoagulation protocol: Yes   Plan:   warfarin 5 mg daily for DC today  Follow up INR 8/22 in clinic Augmentin  875mg  BID x10days per oral surgery     Olam Chalk Pharm.D. CPP, BCPS Clinical Pharmacist [phone] 09/08/2023 11:15 AM   Please check AMION for all [facility] Pharmacy phone numbers After 10:00 PM, call Main Pharmacy [phone]

## 2023-09-08 NOTE — Progress Notes (Cosign Needed)
 Advanced Heart Failure VAD Team Note  PCP-Cardiologist: Ria Commander, DO  Chief Complaint: Dental Abscess Subjective:    Feeling a little better. Still with mouth and neck pain.   LVAD INTERROGATION:  HeartMate 3 LVAD:   Flow 4.4 liters/min, speed 6000, power 5.0, PI 3.4    VAD interrogated personally.    Objective:    Vital Signs:   Temp:  [97.7 F (36.5 C)-98.8 F (37.1 C)] 97.7 F (36.5 C) (08/18 0803) Pulse Rate:  [65-74] 65 (08/18 0803) Resp:  [12-19] 14 (08/18 0803) BP: (76-114)/(58-92) 114/92 (08/18 0803) SpO2:  [97 %-98 %] 98 % (08/18 0803) Weight:  [103.5 kg] 103.5 kg (08/18 0639) Last BM Date : 09/04/23 Mean arterial Pressure 80s  Intake/Output:   Intake/Output Summary (Last 24 hours) at 09/08/2023 0817 Last data filed at 09/08/2023 9360 Gross per 24 hour  Intake 240 ml  Output 1325 ml  Net -1085 ml     Physical Exam   Physical Exam: GENERAL: No acute distress. NECK:JVP flat .   Dressing intact. Induration on left side of neck.  CARDIAC:  Mechanical heart sounds with LVAD hum present.  LUNGS:  Clear to auscultation bilaterally.  ABDOMEN:  Soft, round, nontender,LVAD exit site:  Dressing dry and intact.   EXTREMITIES:  Warm and dry, no edema  NEUROLOGIC:  Alert and oriented x 3.         Telemetry     Labs   Basic Metabolic Panel: Recent Labs  Lab 09/05/23 1204 09/05/23 1413 09/06/23 0229 09/07/23 0220 09/08/23 0315  NA 134* 132* 134* 137 134*  K 4.0 8.0* 3.4* 4.1 3.6  CL 93* 96* 97* 99 102  CO2 29  --  28 27 28   GLUCOSE 87 82 89 131* 102*  BUN 15 23* 13 19 21*  CREATININE 0.93 1.00 0.85 1.26* 1.17*  CALCIUM  9.5  --  9.1 9.1 8.6*    Liver Function Tests: Recent Labs  Lab 09/05/23 1204  AST 29  ALT 17  ALKPHOS 126  BILITOT 1.6*  PROT 8.3*  ALBUMIN  2.8*   No results for input(s): LIPASE, AMYLASE in the last 168 hours. No results for input(s): AMMONIA in the last 168 hours.  CBC: Recent Labs  Lab 09/05/23 1011  09/05/23 1143 09/05/23 1413 09/06/23 0229 09/07/23 0220 09/08/23 0315  WBC 8.4 8.9  --  7.6 9.8 8.6  NEUTROABS  --  7.6  --   --   --   --   HGB 13.1 12.8 13.9 12.2 12.4 11.3*  HCT 41.4 41.7 41.0 38.2 39.9 36.5  MCV 86.1 87.6  --  86.4 86.6 87.5  PLT 153 159  --  150 189 151    INR: Recent Labs  Lab 09/05/23 1143 09/06/23 0229 09/07/23 0220 09/08/23 0315  INR 2.3* 2.4* 2.7* 3.3*    Other results:  Imaging   No results found.    Medications:     Scheduled Medications:  amiodarone   200 mg Oral Daily   atorvastatin   20 mg Oral Daily   digoxin   0.125 mg Oral Daily   Fe Fum-Vit C-Vit B12-FA  1 capsule Oral QPC breakfast   gabapentin   300 mg Oral BID   losartan   50 mg Oral Daily   nicotine   7 mg Transdermal Daily   pantoprazole   40 mg Oral Daily   senna-docusate  1 tablet Oral Daily   spironolactone   25 mg Oral Daily   Warfarin - Pharmacist Dosing Inpatient   Does not  apply q1600    Infusions:  ampicillin -sulbactam (UNASYN ) IV 3 g (09/08/23 0346)    PRN Medications: acetaminophen , albuterol , fentaNYL  (SUBLIMAZE ) injection, oxyCODONE     Assessment/Plan:    1. Dental abscess 2/2 dental caries - Orthopantogram 07/17/23 showed: Dental caries within the crown of tooth # 18 and tooth # 12. Indeterminate 11 mm lytic lesion within the maxilla overlying the crown of tooth # 16. Treatment deferred at that time with urgent need for LVAD. - CT neck + for abscess - 09/06/23 S/P I&D Left submandibullar abscess  extraction #12 and 18.    POSTOP Recommendations per Dr Joanette Schatz drain in left submandibular space will be removed in 2-3 days. Intraoral gauze pressure for hemostasis. Continue IV Unasyn  while inpatient, then convert to Augmentin  - 10 day course total. Chlorhexidine  oral rinses TID x 10 days.- ordered today  Mechanical soft diet as tolerated.Unasyn  while in patient followed by Augmentin  for 10 days.  - Continue oxycodone /Tylenol  for pain    Chronic  systolic HF with biventricular/stage D CM s/p HM III 07/29/23 with concurrent MVR - Stable NYHA class II - Appear euvolemic.  - Continue losartan  to 50mg  daily, Map a little high but will see how she responds after morning medications.   - Continue spironolactone  25mg  daily - Continue digoxin  0.125, check level tomorrow morning - INR 3.3 , goal 2-3, Discussed with pharmacy.  - LDH stable.    Severe MR s/p MVR - Echo 4/25 with severe MR - TEER 6/25 complicated by worsening MR - Now s/p MVR   Hx PE/DVT w/ IVC filter - Evaluated at [facility] where thrombophilia work-up was negative (Lupus inhibitor, anticardiolipin antibodies, and anti beta-2  glycoprotein antibodies were negative; protein C activity 85%, protein S activity 76%, factor V Leiden, and prothrombin gene mutation ) - IVC filter in place; could not be removed due to significant thrombus burden on filter at [facility].  - See warfarin dosing above   CAD - LHC in 5/23 w/ nonobstructive CAD involving RCA - Inferior WMA on most recent echo - No s/s angina - Continue statin   Will need to reach out to Dr Joanette regarding drian and follow up.  Length of Stay: 3  Leya Paige, NP 09/08/2023, 8:17 AM  VAD Team --- VAD ISSUES ONLY--- Pager [phone] (7am - 7am)  Advanced Heart Failure Team  Pager [phone] (M-F; 7a - 5p)  Please contact [facility] for night-coverage after hours (5p -7a ) and weekends on [email or web address]  Patient seen and examined with the above-signed Advanced Practice Provider and/or Housestaff. I personally reviewed laboratory data, imaging studies and relevant notes. I independently examined the patient and formulated the important aspects of the plan. I have edited the note to reflect any of my changes or salient points. I have personally discussed the plan with the patient and/or family.    Much improved after I&D. Afebrile. Facial swelling improved. Pain controlled. Drain out  VAD interrogated personally. Parameters  stable.  General:  NAD.  HEENT: normal  mild left sided facial swelling Neck: supple. JVP not elevated.  Carotids 2+ bilat; no bruits. No lymphadenopathy or thryomegaly appreciated. Cor: LVAD hum.  Lungs: Clear. Abdomen: obese soft, nontender, non-distended. No hepatosplenomegaly. No bruits or masses. Good bowel sounds. Driveline site clean. Anchor in place.  Extremities: no cyanosis, clubbing, rash. Warm no edema  Neuro: alert & oriented x 3. No focal deficits. Moves all 4 without problem  She is improved. OK for d/c with instructions per oral surgeon.  VAD interrogated personally. Parameters stable.  INR 3.3 Discussed warfarin dosing with PharmD personally.  MD d/c time 37 mins  Toribio Fuel, MD  10:46 PM

## 2023-09-08 NOTE — Progress Notes (Signed)
 LVAD Coordinator Rounding Note:  Admitted 09/05/23 after pt presented to clinic for dressing change with significant swelling and pain in her face from a tooth abscess.  HM III LVAD implanted on 07/24/23 by Dr. Lucas under destination criteria.  Pt had teeth extractions urgently on Saturday 09/06/23. Drain was removed this morning by oral surgeon. Pt has minimal pain, swelling is slowly improving. Pt is cleared to be discharged today.  Dressing changed today at bedside. Pt has f/u in VAD clinic on Friday with Dr Zenaida.   Vital signs: Temp: 97.8 HR: 70 Doppler Pressure: 97 Automatic BP: 114/92 (101) O2 Sat: 98% on RA Wt: 228.1 lbs  LVAD interrogation reveals:  Speed: 6000 Flow: 4.5 Power: 4.7 w PI: 3.2  Alarms: LV on 8/16 Events:  none last 24 hrs Hematocrit: 36  Fixed speed: 6000 Low speed limit: 5700  Drive Line: Existing VAD dressing removed and site care performed using sterile technique. Drive line exit site cleaned with Chlora prep applicators x 2, allowed to dry, and gauze dressing to exit site. Exit site healing and unincorporated, the velour is fully implanted at exit site; hub is exposed at site. Small amount of serous drainage. No redness, tenderness, foul odor or rash noted. Drive line anchor secured. Continue twice a week dressing changes. Next dressing change 09/12/23.     Labs:  LDH trend: 143  INR trend: 3.3  Anticoagulation Plan: -INR Goal: 2.0-3.0 -ASA Dose: n/a  Blood Products:   Device:  Arrythmias:  HX: NSVT on p.o. amiodarone   Infection:  Adverse Events on VAD:  Plan/Recommendations:  1. Page VAD coordinator with any VAD alarms or equipment issues. 2. Pt ok to d/c home once papers ready from provider team. Pt has f/u with VAD team Friday at 1pm.  Lauraine Ip RN VAD Coordinator  Office: [phone]  24/7 Pager: [phone]

## 2023-09-08 NOTE — Discharge Summary (Cosign Needed)
 Advanced Heart Failure Team  Discharge Summary   Patient ID: [name] [name] MRN: [id number], DOB/AGE: [birth year] 47 y.o. Admit date: 09/05/2023 D/C date:     09/08/2023   Primary Discharge Diagnoses:  1. Dental Abscess 2/2 Dental Caries  2. Chronic HF Biventricular HF, HMIII LVAD   Secondary Discharge Diagnoses:  1. Severe MR S/P MVR 2. H.O PE/DVT IVC Filter  3. CAD    Hospital Course:   KRUTI [name] is a 48 y.o. female with HFrEF, HMIII LVAD, mTEER,  saddle PE/DVT s/p IVC filter (2015), nonobstructive CAD, morbid obesity, pancytopenia (followed by hematology), and tobacco use.   Admitted with large dental abscess. CT confirmed. Oral surgery consulted. Taken to the OR for debridement. Placed on Unasyn  and transitioned to augmentin  at discharge x 10 days.   From HF/LVAD perspective she remained stable. INR adjusted as indicated. INR goal 2-3.   She will continue to be followed closely in the VAD clinic.   D/C Recs per Oral Surgery Recommendations: Continue IV Unasyn  while inpatient, then convert to Augmentin  - 10 day course total. Chlorhexidine  oral rinses TID x 10 days. Keep bandage to left cervical wound. Will allow to heal by secondary intention. Mechanical soft diet as tolerated. Stable from a maxillofacial standpoint for discharge. Patient can follow up at [facility] in 7-10 days, or sooner if symptoms are not improving. She can call [phone] to schedule.  LVAD Interrogation HM III:   Speed:  6000   Flow:   4.4   PI:   3.4    Power:   5    Back-up speed:     6000  Discharge Weight:  Discharge Vitals: Blood pressure (!) 114/92, pulse 65, temperature 97.7 F (36.5 C), temperature source Oral, resp. rate 14, height 5' 10 (1.778 m), weight 103.5 kg, SpO2 98%.  Labs: Lab Results  Component Value Date   WBC 8.6 09/08/2023   HGB 11.3 (L) 09/08/2023   HCT 36.5 09/08/2023   MCV 87.5 09/08/2023   PLT 151 09/08/2023    Recent Labs  Lab 09/05/23 1204  09/05/23 1413 09/08/23 0315  NA 134*   < > 134*  K 4.0   < > 3.6  CL 93*   < > 102  CO2 29   < > 28  BUN 15   < > 21*  CREATININE 0.93   < > 1.17*  CALCIUM  9.5   < > 8.6*  PROT 8.3*  --   --   BILITOT 1.6*  --   --   ALKPHOS 126  --   --   ALT 17  --   --   AST 29  --   --   GLUCOSE 87   < > 102*   < > = values in this interval not displayed.   Lab Results  Component Value Date   CHOL 109 07/17/2023   HDL 47 07/17/2023   LDLCALC 54 07/17/2023   TRIG 38 07/17/2023   BNP (last 3 results) Recent Labs    07/04/23 1057 07/25/23 0500 07/31/23 0519  BNP 612.0* 938.1* 856.6*    ProBNP (last 3 results) No results for input(s): PROBNP in the last 8760 hours.   Diagnostic Studies/Procedures   No results found.  Discharge Medications   Allergies as of 09/08/2023       Reactions   Sulfa  Antibiotics Itching, Swelling, Rash   Lip swelling        Medication List  TAKE these medications    acetaminophen  500 MG tablet Commonly known as: TYLENOL  Take 1 tablet (500 mg total) by mouth every 6 (six) hours as needed. What changed:  how much to take reasons to take this   albuterol  108 (90 Base) MCG/ACT inhaler Commonly known as: VENTOLIN  HFA Inhale 2 puffs into the lungs every 2 (two) hours as needed for wheezing or shortness of breath.   amiodarone  200 MG tablet Commonly known as: PACERONE  Take 1 tablet (200 mg total) by mouth daily.   amoxicillin -clavulanate 875-125 MG tablet Commonly known as: AUGMENTIN  Take 1 tablet by mouth 2 (two) times daily. Start taking on: September 09, 2023   atorvastatin  20 MG tablet Commonly known as: LIPITOR TAKE ONE TABLET BY MOUTH ONCE DAILY.   benzonatate  100 MG capsule Commonly known as: TESSALON  Take 1 capsule (100 mg total) by mouth 3 (three) times daily as needed for cough. Do not take with alcohol  or while operating or driving heavy machinery   chlorhexidine  0.12 % solution Commonly known as: PERIDEX  Use as  directed 15 mLs in the mouth or throat 3 (three) times daily.   digoxin  0.125 MG tablet Commonly known as: LANOXIN  Take 1 tablet (0.125 mg total) by mouth daily.   gabapentin  300 MG capsule Commonly known as: NEURONTIN  Take 1 capsule (300 mg total) by mouth 2 (two) times daily.   losartan  25 MG tablet Commonly known as: COZAAR  Take 2 tablets (50 mg total) by mouth daily.   nicotine  7 mg/24hr patch Commonly known as: NICODERM CQ  - dosed in mg/24 hr Place 1 patch (7 mg total) onto the skin daily.   oxyCODONE  5 MG immediate release tablet Commonly known as: Oxy IR/ROXICODONE  Take 1 tablet (5 mg total) by mouth every 4 (four) hours as needed for severe pain (pain score 7-10).   pantoprazole  40 MG tablet Commonly known as: PROTONIX  Take 1 tablet (40 mg total) by mouth daily.   potassium chloride  SA 20 MEQ tablet Commonly known as: KLOR-CON  M Take 1 tablet (20 mEq total) by mouth every other day. With Torsemide  What changed:  when to take this additional instructions   Senna-S 8.6-50 MG tablet Generic drug: senna-docusate Take 1 tablet by mouth daily.   spironolactone  25 MG tablet Commonly known as: ALDACTONE  Take 1 tablet (25 mg total) by mouth daily.   torsemide  20 MG tablet Commonly known as: DEMADEX  Take 2 tablets (40 mg total) by mouth daily.   traMADol  50 MG tablet Commonly known as: ULTRAM  Take 50-100 mg by mouth every 6 (six) hours as needed.   Trigels-F Forte Caps capsule Generic drug: Fe Fum-Vit C-Vit B12-FA Take 1 capsule by mouth daily after breakfast.   warfarin 5 MG tablet Commonly known as: COUMADIN  Take as directed. If you are unsure how to take this medication, talk to your nurse or doctor. Original instructions: Take 7.5 mg (1.5 tablets) by mouth daily on Tuesdays, Thursdays, and Saturdays, then take 5 mg (1 tablet) by mouth on all other days.               Discharge Care Instructions  (From admission, onward)           Start      Ordered   09/08/23 0000  Discharge wound care:       Comments: Left neck dressing. Keep dry and intact   09/08/23 1116            Disposition   The patient will be discharged in  stable condition to home. Discharge Instructions     Diet - low sodium heart healthy   Complete by: As directed    Discharge wound care:   Complete by: As directed    Left neck dressing. Keep dry and intact   Increase activity slowly   Complete by: As directed    Speed Settings:   Complete by: As directed    Fixed 6000 RPM Low 5700 RPM          APP Duration of Discharge Encounter: 10   Signed, [name]  09/08/2023, 11:16 AM Patient seen and examined with the above-signed Advanced Practice Provider and/or Housestaff. I personally reviewed laboratory data, imaging studies and relevant notes. I independently examined the patient and formulated the important aspects of the plan. I have edited the note to reflect any of my changes or salient points. I have personally discussed the plan with the patient and/or family.    Much improved after I&D. Afebrile. Facial swelling improved. Pain controlled. Drain out  VAD interrogated personally. Parameters stable.  General:  NAD.  HEENT: normal  mild left sided facial swelling Neck: supple. JVP not elevated.  Carotids 2+ bilat; no bruits. No lymphadenopathy or thryomegaly appreciated. Cor: LVAD hum.  Lungs: Clear. Abdomen: obese soft, nontender, non-distended. No hepatosplenomegaly. No bruits or masses. Good bowel sounds. Driveline site clean. Anchor in place.  Extremities: no cyanosis, clubbing, rash. Warm no edema  Neuro: alert & oriented x 3. No focal deficits. Moves all 4 without problem  She is improved. OK for d/c with instructions per oral surgeon.   VAD interrogated personally. Parameters stable.  INR 3.3 Discussed warfarin dosing with PharmD personally.  MD d/c time 37 mins  Toribio Fuel, MD  10:47 PM

## 2023-09-08 NOTE — Anesthesia Postprocedure Evaluation (Signed)
 Anesthesia Post Note  Patient: [name] [name]  Procedure(s) Performed: INCISION AND DRAINAGE, ABSCESS (Left)     Patient location during evaluation: PACU Anesthesia Type: General Level of consciousness: awake and alert Pain management: pain level controlled Vital Signs Assessment: post-procedure vital signs reviewed and stable Respiratory status: spontaneous breathing, nonlabored ventilation, respiratory function stable and patient connected to nasal cannula oxygen Cardiovascular status: blood pressure returned to baseline and stable Postop Assessment: no apparent nausea or vomiting Anesthetic complications: no   No notable events documented.  Last Vitals:  Vitals:   09/08/23 0347 09/08/23 0803  BP: 94/77 (!) 114/92  Pulse: 65 65  Resp: 12 14  Temp: 36.5 C 36.5 C  SpO2:  98%    Last Pain:  Vitals:   09/08/23 0803  TempSrc: Oral  PainSc: 7                  [name] S

## 2023-09-09 ENCOUNTER — Telehealth: Payer: Self-pay | Admitting: *Deleted

## 2023-09-09 ENCOUNTER — Ambulatory Visit (HOSPITAL_COMMUNITY)

## 2023-09-09 NOTE — Transitions of Care (Post Inpatient/ED Visit) (Signed)
   09/09/2023  Name: [name] [name] MRN: [id number] DOB: 1975-02-27  Today's TOC FU Call Status: Today's TOC FU Call Status:: Unsuccessful Call (1st Attempt) Unsuccessful Call (1st Attempt) Date: 09/09/23  Attempted to reach the patient regarding the most recent Inpatient/ED visit.  Follow Up Plan: Additional outreach attempts will be made to reach the patient to complete the Transitions of Care (Post Inpatient/ED visit) call.   Mliss Creed [facility], BSN RN Care Manager/ Transition of Care [place]/ [facility] [phone]

## 2023-09-10 ENCOUNTER — Encounter (HOSPITAL_COMMUNITY): Admitting: Cardiology

## 2023-09-10 LAB — CULTURE, BLOOD (ROUTINE X 2)
Culture: NO GROWTH
Special Requests: ADEQUATE

## 2023-09-10 NOTE — Care Management Important Message (Signed)
 Important Message  Patient Details  Name: [name] [name] MRN: [id number] Date of Birth: 10-12-1975   Important Message Given:        Glade Cuff 09/10/2023, 3:21 PM

## 2023-09-11 ENCOUNTER — Other Ambulatory Visit (HOSPITAL_COMMUNITY): Payer: Self-pay

## 2023-09-11 ENCOUNTER — Telehealth: Payer: Self-pay

## 2023-09-11 DIAGNOSIS — Z95811 Presence of heart assist device: Secondary | ICD-10-CM

## 2023-09-11 DIAGNOSIS — Z7901 Long term (current) use of anticoagulants: Secondary | ICD-10-CM

## 2023-09-11 LAB — CULTURE, BLOOD (ROUTINE X 2)
Culture: NO GROWTH
Special Requests: ADEQUATE

## 2023-09-11 NOTE — Transitions of Care (Post Inpatient/ED Visit) (Signed)
   09/11/2023  Name: [name] [name] MRN: [id number] DOB: 09-30-1975  Today's TOC FU Call Status: Today's TOC FU Call Status:: Unsuccessful Call (2nd Attempt) Unsuccessful Call (2nd Attempt) Date: 09/11/23  Attempted to reach the patient regarding the most recent Inpatient/ED visit.  Follow Up Plan: Additional outreach attempts will be made to reach the patient to complete the Transitions of Care (Post Inpatient/ED visit) call.   Alan Ee, RN, BSN, CEN [employer]- Transition of Care Team.  [facility] [phone]

## 2023-09-12 ENCOUNTER — Telehealth: Payer: Self-pay | Admitting: *Deleted

## 2023-09-12 ENCOUNTER — Ambulatory Visit (HOSPITAL_COMMUNITY)
Admission: RE | Admit: 2023-09-12 | Discharge: 2023-09-12 | Disposition: A | Discharge status: Home / Self Care | Source: Ambulatory Visit | Attending: Cardiology | Admitting: Cardiology

## 2023-09-12 ENCOUNTER — Other Ambulatory Visit (HOSPITAL_COMMUNITY)

## 2023-09-12 ENCOUNTER — Other Ambulatory Visit (HOSPITAL_COMMUNITY): Payer: Self-pay

## 2023-09-12 VITALS — BP 116/0 | HR 77 | Wt 236.2 lb

## 2023-09-12 DIAGNOSIS — I428 Other cardiomyopathies: Secondary | ICD-10-CM | POA: Diagnosis not present

## 2023-09-12 DIAGNOSIS — I5043 Acute on chronic combined systolic (congestive) and diastolic (congestive) heart failure: Secondary | ICD-10-CM

## 2023-09-12 DIAGNOSIS — Z95811 Presence of heart assist device: Secondary | ICD-10-CM

## 2023-09-12 DIAGNOSIS — D61818 Other pancytopenia: Secondary | ICD-10-CM | POA: Insufficient documentation

## 2023-09-12 DIAGNOSIS — Z79899 Other long term (current) drug therapy: Secondary | ICD-10-CM | POA: Insufficient documentation

## 2023-09-12 DIAGNOSIS — Z6833 Body mass index (BMI) 33.0-33.9, adult: Secondary | ICD-10-CM | POA: Insufficient documentation

## 2023-09-12 DIAGNOSIS — Z7901 Long term (current) use of anticoagulants: Secondary | ICD-10-CM

## 2023-09-12 DIAGNOSIS — F17200 Nicotine dependence, unspecified, uncomplicated: Secondary | ICD-10-CM | POA: Insufficient documentation

## 2023-09-12 DIAGNOSIS — Z86718 Personal history of other venous thrombosis and embolism: Secondary | ICD-10-CM | POA: Diagnosis not present

## 2023-09-12 DIAGNOSIS — Z952 Presence of prosthetic heart valve: Secondary | ICD-10-CM | POA: Insufficient documentation

## 2023-09-12 DIAGNOSIS — D509 Iron deficiency anemia, unspecified: Secondary | ICD-10-CM | POA: Insufficient documentation

## 2023-09-12 DIAGNOSIS — Z86711 Personal history of pulmonary embolism: Secondary | ICD-10-CM | POA: Diagnosis not present

## 2023-09-12 DIAGNOSIS — I2510 Atherosclerotic heart disease of native coronary artery without angina pectoris: Secondary | ICD-10-CM | POA: Diagnosis not present

## 2023-09-12 DIAGNOSIS — I5022 Chronic systolic (congestive) heart failure: Secondary | ICD-10-CM | POA: Diagnosis not present

## 2023-09-12 DIAGNOSIS — Z4509 Encounter for adjustment and management of other cardiac device: Secondary | ICD-10-CM | POA: Diagnosis present

## 2023-09-12 LAB — CBC
HCT: 39.9 % (ref 36.0–46.0)
Hemoglobin: 12.5 g/dL (ref 12.0–15.0)
MCH: 27.5 pg (ref 26.0–34.0)
MCHC: 31.3 g/dL (ref 30.0–36.0)
MCV: 87.9 fL (ref 80.0–100.0)
Platelets: 143 K/uL — ABNORMAL LOW (ref 150–400)
RBC: 4.54 MIL/uL (ref 3.87–5.11)
RDW: 16.7 % — ABNORMAL HIGH (ref 11.5–15.5)
WBC: 4.4 K/uL (ref 4.0–10.5)
nRBC: 0 % (ref 0.0–0.2)

## 2023-09-12 LAB — PROTIME-INR
INR: 2 — ABNORMAL HIGH (ref 0.8–1.2)
Prothrombin Time: 23.2 s — ABNORMAL HIGH (ref 11.4–15.2)

## 2023-09-12 LAB — BASIC METABOLIC PANEL WITH GFR
Anion gap: 11 (ref 5–15)
BUN: 13 mg/dL (ref 6–20)
CO2: 29 mmol/L (ref 22–32)
Calcium: 9.6 mg/dL (ref 8.9–10.3)
Chloride: 100 mmol/L (ref 98–111)
Creatinine, Ser: 0.99 mg/dL (ref 0.44–1.00)
GFR, Estimated: >60 mL/min (ref >60)
Glucose, Bld: 79 mg/dL (ref 70–99)
Potassium: 4.5 mmol/L (ref 3.5–5.1)
Sodium: 140 mmol/L (ref 135–145)

## 2023-09-12 LAB — DIGOXIN LEVEL: Digoxin Level: 0.7 ng/mL — ABNORMAL LOW (ref 0.8–2.0)

## 2023-09-12 LAB — LACTATE DEHYDROGENASE: LDH: 244 U/L — ABNORMAL HIGH (ref 98–192)

## 2023-09-12 MED ORDER — WARFARIN SODIUM 5 MG PO TABS: 5.0000 mg | ORAL_TABLET | Freq: Every day | ORAL | Status: DC

## 2023-09-12 MED ORDER — GABAPENTIN 300 MG PO CAPS: 300.0000 mg | ORAL_CAPSULE | Freq: Two times a day (BID) | ORAL | 0 refills | Status: DC

## 2023-09-12 NOTE — Progress Notes (Addendum)
 Patient presents for 2 week f/u in VAD Clinic today with her mom [name]. Reports no problems with VAD equipment or concerns with drive line.  Denies lightheadedness, dizziness, falls, shortness of breath, and signs of bleeding. She has not had to use her rescue inhaler recently. She had one episode of dizziness earlier this week that resolved quickly with rest. She felt this may have been due to taking all of her medications without eating prior.  Recently discharged due to a large dental abscess. Went for I&D with Dr.Drabb 8/15 with extraction of tooth #12 and #18 with a penrose drain placement to the left submandibular space. Drain removed by Dr. Marvene prior to discharge dressing intact pt reports no drainage from site. Pt currently on 10 day course of Augementin with end date of 8/25. Pt to call and schedule hospital f/u in Dr Minus office for next week. Provided with office phone number for scheduling.  Surgical sternal pain has improved. Pt only taking Tylenol  for pain management. Pt asking about lifting sternal precautions and lifting driving restrictions. Discussed with Dr. Zenaida will obtain CXR today and pt advise to continue sternal precautions due to prolonged course of post op sternal pain.  Losartan  to 50mg  daily increased last visit without issue. Pt remains hypertensive at today's visit; however, she did not take her Losartan , Spironolactone  or Torsemide  prior to today's appointment. Advised to please take all medications prior to next appointment.   Nicotine  patches sent to [facility] at discharge. Pt to pick up at 14 day supply today.  Continued dressing change education provided to pt's mother [name]. Practiced donning sterile gloves today and sent home with extra pairs to continue practicing.   Vital Signs:  Doppler Pressure: 116 Automatic BP: 115/92 (101) Repeat: 109/89 (97) HR: 77 SPO2: 99   Weight:  236.2 lb w/ eqt Discharge weight: 228 lb Last weight:  235.4 lb  BMI today 33.9 today   VAD Indication: Destination Therapy- smoking   HM3 assessment:   Speed: 6000 rpms Flow: 4.1 Power: 4.8 w    PI: 3.3  Alarms: none Events: 0-5  Fixed speed: 6000 Low speed limit: 5700  Primary controller: back up battery due for replacement in 33 months Secondary controller:  back up battery due for replacement in 33 months    I reviewed the LVAD parameters from today and compared the results to the patient's prior recorded data. LVAD interrogation was NEGATIVE for significant power changes, NEGATIVE for clinical alarms and STABLE for PI events/speed drops. No programming changes were made and pump is functioning within specified parameters. Pt is performing daily controller and system monitor self tests along with completing weekly and monthly maintenance for LVAD equipment.   LVAD equipment check completed and is in good working order. Back-up equipment present. Charged back up battery and performed self-test on equipment.    Annual Equipment Maintenance on UBC/PM was performed on 08/05/23.   Exit Site Care: Existing VAD dressing removed and site care performed using sterile technique. Drive line exit site cleaned with Chlora prep applicators x 2, allowed to dry, and gauze dressing to exit site. Exit site healing and partially incorporated, the velour is fully implanted at exit site; hub is exposed at site. Scant amount of serous drainage. Cellerate placed at exit site with sterile cotton tip applicator. No redness, tenderness, foul odor or rash noted. Drive line anchor secured. Advance to weekly dressing changes using daily kit. Pt provided with extra tape for home use if  needed.    Significant Events on VAD Support:     BP & Labs:  MAP 116 - Doppler is reflecting modified systolic   Hgb 12.5 - No S/S of bleeding. Specifically denies melena/BRBPR or nosebleeds.   LDH stable at  244 with established baseline of 189- 476. Denies tea-colored  urine. No power elevations noted on interrogation.    Addendum: INR goal 2.0 today discussed with Lauren RPH. Instructions give for pt to take 7.5mg  Tuesday/Saturday and 5mg  all other days. VM left with instructions and verbalizes understanding. Will repeat INR next week.   Plan:  Obtain Chest Xray today for evaluation for driving  2. Return in 1 week for dressing change 3. Return in 1 month for follow up with Dr. Gardenia 4. Patient can follow up at [facility] in 7-10 days, or sooner if symptoms are not improving. She can call [phone] to schedule.   [name] Lunger RN, BSN VAD Coordinator 24/7 Pager [phone]

## 2023-09-12 NOTE — Transitions of Care (Post Inpatient/ED Visit) (Signed)
 09/12/2023  Name: [name] [name] MRN: [id number] DOB: 1975-07-25  Today's TOC FU Call Status: Today's TOC FU Call Status:: Successful TOC FU Call Completed TOC FU Call Complete Date: 09/12/23 Patient's Name and Date of Birth confirmed.  Transition Care Management Follow-up Telephone Call Date of Discharge: 09/08/23 Discharge Facility: [name]) Type of Discharge: Inpatient Admission Primary Inpatient Discharge Diagnosis:: Complex Dental Caries How have you been since you were released from the hospital?: Better Any questions or concerns?: No  Items Reviewed: Did you receive and understand the discharge instructions provided?: Yes Medications obtained,verified, and reconciled?: Yes (Medications Reviewed) Any new allergies since your discharge?: No Dietary orders reviewed?: Yes Type of Diet Ordered:: heart healthy Do you have support at home?: Yes People in Home [RPT]: parent(s) Name of Support/Comfort Primary Source: pt lives with her mother [name] [name] Unable to change dosage of warfarin on med profile,  pt reports she is taking 5mg  tablet as follows- 1/2 tablet Tues, Thurs, Sat, 1 whole tablet all other days Reviewed signs/ symptoms of infection Pt weighs daily and records Reviewed HF action plan Pt has PCS aide 2.5 hours per day  Medications Reviewed Today: Medications Reviewed Today     Reviewed by Aura Mliss LABOR, RN (Registered Nurse) on 09/12/23 at 1608  Med List Status: <None>   Medication Order Taking? Sig Documenting Provider Last Dose Status Informant  acetaminophen  (TYLENOL ) 500 MG tablet [id number] Yes Take 1 tablet (500 mg total) by mouth every 6 (six) hours as needed. [name], [name], DO  Active Self  albuterol  (VENTOLIN  HFA) 108 (90 Base) MCG/ACT inhaler [id number] Yes Inhale 2 puffs into the lungs every 2 (two) hours as needed for wheezing or shortness of breath. [name], MD  Active Self  amiodarone  (PACERONE ) 200 MG tablet [id number] Yes  Take 1 tablet (200 mg total) by mouth daily. [name], [name], NP  Active Self  amoxicillin -clavulanate (AUGMENTIN ) 875-125 MG tablet [id number] Yes Take 1 tablet by mouth 2 (two) times daily. Lenetta No D, NP  Active   atorvastatin  (LIPITOR) 20 MG tablet [id number] Yes TAKE ONE TABLET BY MOUTH ONCE DAILY. Alvan Dorn FALCON, MD  Active Self  benzonatate  (TESSALON ) 100 MG capsule [id number]  Take 1 capsule (100 mg total) by mouth 3 (three) times daily as needed for cough. Do not take with alcohol  or while operating or driving heavy machinery  Patient not taking: Reported on 09/12/2023   Grooms, [name], [place]  Active Self  chlorhexidine  (PERIDEX ) 0.12 % solution [id number] Yes Use as directed 15 mLs in the mouth or throat 3 (three) times daily. Clegg, [name], NP  Active   digoxin  (LANOXIN ) 0.125 MG tablet [id number] Yes Take 1 tablet (0.125 mg total) by mouth daily. [name], [name], NP  Active Self  Fe Fum-Vit C-Vit B12-FA (TRIGELS-F FORTE) CAPS capsule [id number] Yes Take 1 capsule by mouth daily after breakfast. [name], [name], NP  Active Self  gabapentin  (NEURONTIN ) 300 MG capsule [id number] Yes Take 1 capsule (300 mg total) by mouth 2 (two) times daily. [name], MD  Active   losartan  (COZAAR ) 25 MG tablet [id number] Yes Take 2 tablets (50 mg total) by mouth daily. [name], MD  Active Self  nicotine  (NICODERM CQ  - DOSED IN MG/24 HR) 7 mg/24hr patch [id number]  Place 1 patch (7 mg total) onto the skin daily.  Patient not taking: Reported on 09/12/2023   Grooms, Charmaine, [place]  Active Self  oxyCODONE  (OXY IR/ROXICODONE ) 5 MG immediate release tablet [id number] Yes  Take 1 tablet (5 mg total) by mouth every 4 (four) hours as needed for severe pain (pain score 7-10). Lenetta No D, NP  Active   pantoprazole  (PROTONIX ) 40 MG tablet [id number] Yes Take 1 tablet (40 mg total) by mouth daily. [name], [name], NP  Active Self  potassium chloride  SA (KLOR-CON  M) 20 MEQ tablet [id number] Yes Take 1 tablet (20 mEq  total) by mouth every other day. With Torsemide  Clegg, [name], NP  Active   senna-docusate (SENOKOT-S) 8.6-50 MG tablet [id number] Yes Take 1 tablet by mouth daily. [name], [name], NP  Active Self  spironolactone  (ALDACTONE ) 25 MG tablet [id number] Yes Take 1 tablet (25 mg total) by mouth daily. [name], [name], NP  Active Self  torsemide  (DEMADEX ) 20 MG tablet [id number] Yes Take 2 tablets (40 mg total) by mouth daily. Clegg, [name], NP  Active   traMADol  (ULTRAM ) 50 MG tablet [id number] Yes Take 50-100 mg by mouth every 6 (six) hours as needed. [provider]  Active Self  warfarin (COUMADIN ) 5 MG tablet [id number] Yes Take 1 tablet (5 mg total) by mouth daily at 4 PM. [name], MD  Active             Home Care and Equipment/Supplies: Were Home Health Services Ordered?: No Any new equipment or medical supplies ordered?: No  Functional Questionnaire: Do you need assistance with bathing/showering or dressing?: Yes (aide assists) Do you need assistance with meal preparation?: No Do you need assistance with eating?: No Do you have difficulty maintaining continence: No Do you need assistance with getting out of bed/getting out of a chair/moving?: No Do you have difficulty managing or taking your medications?: No  Follow up appointments reviewed: PCP Follow-up appointment confirmed?: Yes Date of PCP follow-up appointment?: 09/17/23 Follow-up Provider: [name] Follow-up appointment confirmed?: Yes Date of Specialist follow-up appointment?: 09/12/23 Follow-Up Specialty Provider:: VAD clinic today,  pt to follow up with oran surgeon on 8/28 Do you need transportation to your follow-up appointment?: No Do you understand care options if your condition(s) worsen?: Yes-patient verbalized understanding  SDOH Interventions Today    Flowsheet Row Most Recent Value  SDOH Interventions   Food Insecurity Interventions Intervention Not Indicated  Housing  Interventions Intervention Not Indicated  Transportation Interventions Intervention Not Indicated  Utilities Interventions Intervention Not Indicated    Mliss Creed [facility], BSN RN Care Manager/ Transition of Care [place]/ [facility] Population Health [phone]

## 2023-09-12 NOTE — Patient Instructions (Addendum)
 Obtain Chest Xray today for evaluation for driving  2. Return in 1 week for dressing change 3. Return in 1 month for follow up with Dr. Gardenia 4. Patient can follow up at [facility] in 7-10 days, or sooner if symptoms are not improving. She can call [phone] to schedule.

## 2023-09-13 ENCOUNTER — Ambulatory Visit (HOSPITAL_COMMUNITY): Payer: Self-pay | Admitting: Cardiology

## 2023-09-14 NOTE — Progress Notes (Signed)
 LVAD CLINIC NOTE  Primary Care: [facility], [place], [place] Primary Cardiologist: Dorn Ross, MD HF Cardiologist: Dr. Gardenia EP: Eulas Furbish, MD   HPI: [name] [name] is a 48 y.o. female with hx of saddle PE/DVT s/p IVC filter (2015), HFrEF/NICM, pancytopenia, nonobstructive CAD, morbid obesity, noncompliance, pancytopenia (followed by hematology), tobacco use.  Admitted with acute on chronic CHF in 06/24. EF was down to 25-30%.   Had been considered for mTEER and possible LVAD but was lost to follow-up until recent readmission.  Admitted to [facility] ICU in 04/25 with cardiogenic shock. Transferred to [facility] with worsening hypotension and increased pressor requirements.  Echo 4/25 EF 20-25% severe RV dysfunction with septal flattening mod-severe MR. She was discharged with home inotrope with plan to workup for advanced therapies as an ouptatient if compliance improved. Discharge weight 244 lbs.  Underwent mTEER, complicated by torrential MR and damaged leaflet. Required IABP --> Impella 5.5 Underwent HM3 implant on 07/24/23 along with mitral valve replacement.  Interval hx:  Patient reports that overall she has been doing very well. She is still short of breath with more than moderate exertion, but this is improving. No issues with hypotension or dizziness since starting losartan , BP elevated again in clinic today. No driveline issues, some continued sternal pain that is improving with prn medications, mainly taking at night.      Current Outpatient Medications  Medication Sig Dispense Refill   acetaminophen  (TYLENOL ) 500 MG tablet Take 1 tablet (500 mg total) by mouth every 6 (six) hours as needed. 30 tablet 0   albuterol  (VENTOLIN  HFA) 108 (90 Base) MCG/ACT inhaler Inhale 2 puffs into the lungs every 2 (two) hours as needed for wheezing or shortness of breath. 18 g 11   amiodarone  (PACERONE ) 200 MG tablet Take 1 tablet (200 mg total) by mouth daily. 90 tablet 4    amoxicillin -clavulanate (AUGMENTIN ) 875-125 MG tablet Take 1 tablet by mouth 2 (two) times daily. 20 tablet 0   atorvastatin  (LIPITOR) 20 MG tablet TAKE ONE TABLET BY MOUTH ONCE DAILY. 90 tablet 3   chlorhexidine  (PERIDEX ) 0.12 % solution Use as directed 15 mLs in the mouth or throat 3 (three) times daily. 120 mL 0   digoxin  (LANOXIN ) 0.125 MG tablet Take 1 tablet (0.125 mg total) by mouth daily. 90 tablet 4   Fe Fum-Vit C-Vit B12-FA (TRIGELS-F FORTE) CAPS capsule Take 1 capsule by mouth daily after breakfast. 90 capsule 4   losartan  (COZAAR ) 25 MG tablet Take 2 tablets (50 mg total) by mouth daily. 90 tablet 3   oxyCODONE  (OXY IR/ROXICODONE ) 5 MG immediate release tablet Take 1 tablet (5 mg total) by mouth every 4 (four) hours as needed for severe pain (pain score 7-10). 15 tablet 0   pantoprazole  (PROTONIX ) 40 MG tablet Take 1 tablet (40 mg total) by mouth daily. 90 tablet 4   potassium chloride  SA (KLOR-CON  M) 20 MEQ tablet Take 1 tablet (20 mEq total) by mouth every other day. With Torsemide      senna-docusate (SENOKOT-S) 8.6-50 MG tablet Take 1 tablet by mouth daily. 90 tablet 4   spironolactone  (ALDACTONE ) 25 MG tablet Take 1 tablet (25 mg total) by mouth daily. 180 tablet 4   torsemide  (DEMADEX ) 20 MG tablet Take 2 tablets (40 mg total) by mouth daily.     traMADol  (ULTRAM ) 50 MG tablet Take 50-100 mg by mouth every 6 (six) hours as needed.     benzonatate  (TESSALON ) 100 MG capsule Take 1 capsule (100 mg total)  by mouth 3 (three) times daily as needed for cough. Do not take with alcohol  or while operating or driving heavy machinery (Patient not taking: Reported on 09/12/2023) 21 capsule 0   gabapentin  (NEURONTIN ) 300 MG capsule Take 1 capsule (300 mg total) by mouth 2 (two) times daily. 60 capsule 0   nicotine  (NICODERM CQ  - DOSED IN MG/24 HR) 7 mg/24hr patch Place 1 patch (7 mg total) onto the skin daily. (Patient not taking: Reported on 09/12/2023) 28 patch 0   warfarin (COUMADIN ) 5 MG tablet  Take 1 tablet (5 mg total) by mouth daily at 4 PM.     No current facility-administered medications for this encounter.     PHYSICAL EXAM: Vitals:   09/12/23 1304 09/12/23 1305  BP: 109/89 (!) 116/0  Pulse:    SpO2:      Physical Exam: GENERAL: no acute distress. CARDIAC:  Mechanical heart sounds with LVAD hum present. Systolic murmur. JVP flat LUNGS:  Clear to auscultation bilaterally.  ABDOMEN:  Soft, round, nontender LVAD exit site:   Dressing dry and intact.  No erythema or drainage.  Stabilization device present and accurately applied.  EXTREMITIES:  Warm and dry, no cyanosis, clubbing, rash or edema   Vital Signs:  Doppler Pressure: 110 Automatic BP: 109/96 (102) HR: 85 SPO2: 97%   Weight: 235.4 lb w/o eqt Last weight: 248 lb   BMI today 33.7 today   VAD Indication: Destination Therapy- smoking   HM3 assessment:   Speed: 6000 rpms Flow: 4.8 Power: 4.9 w    PI: 3.1   Alarms: none Events: none  Fixed speed: 6000 Low speed limit: 5700   Primary controller: back up battery due for replacement in 34 months Secondary controller:  back up battery due for replacement in 34 months     I reviewed the LVAD parameters from today and compared the results to the patient's prior recorded data. LVAD interrogation was NEGATIVE for significant power changes, NEGATIVE for clinical alarms and STABLE for PI events/speed drops. No programming changes were made and pump is functioning within specified parameters. Pt is performing daily controller and system monitor self tests along with completing weekly and monthly maintenance for LVAD equipment.   LVAD equipment check completed and is in good working order. Back-up equipment present. Charged back up battery and performed self-test on equipment.    Annual Equipment Maintenance on UBC/PM was performed on 08/05/23.   Wt Readings from Last 3 Encounters:  09/12/23 103.9 kg (229 lb)  09/12/23 107.1 kg (236 lb 3.2 oz)  09/08/23  103.5 kg (228 lb 2.8 oz)    DATA REVIEW   ECHO: 09/24/21: LVEF 30-35%, RV function mildly reduced, severe posterior MR 8/23: LVEF 30-35%, LVID 7.2cm w/ grade III DD, moderate to severe MR. Severely dilated LA.  09/03/17: LVEF 60%-65%.  TEE 2/24: severe eccentric MR, moderately reduced LV function.  07/03/22: LVEF 25-30% 05/07/23: LVEF 25-30%, grade III DD, RV moderately reduced, moderate posterior MR 05/16/23: LVEF 25-30%, RV moderately reduced, moderate to severe MR   CATH: 06/19/21: 40 to 50% proximal right coronary narrowing. Right dominant anatomy Coronary arteries otherwise normal Significant elevation LVEDP 30 mmHg consistent with acute on chronic combined systolic and diastolic heart failure Mild pulmonary hypertension, mean pressure 30 mmHg.  WHO group II etiology based on hemodysnamics.. Capillary wedge mean 24 mmHg.  V wave to 40 mmHg suggesting some degree of mitral regurgitation. Cardiac output 8.5 L/min with index 3.57 L/min/m. Pulmonary resistance 0.7 Wood units.  RHC  on 7.5 DBA 05/13/23: HEMODYNAMICS: RA:       19 mmHg (mean) RV:       90/9, 19 mmHg PA:       90/38 mmHg (55 mean) PCWP: 27 mmHg (mean) with v waves to 45                                      Estimated Fick CO/CI   6.6L/min, 2.74L/min/m2         Thermodilution CO/CI   6.4L/min, 2.66L/min/m2                                      TPG  28  mmHg                                               PVR  4.375 Wood Units  PAPi  2.73    ASSESSMENT & PLAN: Chronic systolic HF with biventricular dysfunction/stage D cardiomyopathy s/p HM3 07/24/2023 with concurrent MVR - Tolerated well, long inotrope wean for RV dysfunction but off within 2 weeks - NYHA class II, significantly improved - MAPs elevated today, increase losartan  to 50mg  daily - Continue spironolactone  25mg  daily, digoxin  0.125mg  daily - Continue torsemide  40mg  daily, may be able to transition to entresto and decrease at next visit - INR 1.9, goal 2-3,  discussed with pharmD - LDH 171, downtrending - Dig level 0.8, but doubt true trough - Follow up in 2 weeks    2. Severe MR s/p MVR - Echo 4/25 with severe MR  - TEER on 07/09/23 complicated by worsening MR - S/p MVR   3. Hx of PE/DVT w/ IVC filter - Evaluated at [facility] where thrombophilia work-up was negative (Lupus inhibitor, anticardiolipin antibodies, and anti beta-2  glycoprotein antibodies were negative; protein C activity 85%, protein S activity 76%, factor V Leiden, and prothrombin gene mutation ) - IVC filter in place; could not be removed due to significant thrombus burden on filter at [facility].  - Continue Warfarin as above   4. Obesity  - Body mass index is 33.89 kg/m.  - BMI significantly improved, may need GLP-1 later if considering txp   5. CAD - LHC in 5/23 w/ nonobstructive CAD involving RCA - Inferior WMA on most recent echo - stable; no chest pain.    6. Microcytic anemia - Iron  labs repeated today  7. Tobacco use - discussed importance of cessation with regards to transplant in the future.    Morene Brownie, MD 1:56 PM

## 2023-09-15 ENCOUNTER — Ambulatory Visit (HOSPITAL_COMMUNITY): Payer: Self-pay | Admitting: Pharmacist

## 2023-09-17 ENCOUNTER — Encounter: Payer: Self-pay | Admitting: Physician Assistant

## 2023-09-17 ENCOUNTER — Ambulatory Visit (INDEPENDENT_AMBULATORY_CARE_PROVIDER_SITE_OTHER): Admitting: Physician Assistant

## 2023-09-17 VITALS — HR 73 | Temp 98.3°F | Ht 70.0 in | Wt 229.4 lb

## 2023-09-17 DIAGNOSIS — K029 Dental caries, unspecified: Secondary | ICD-10-CM

## 2023-09-17 DIAGNOSIS — Z716 Tobacco abuse counseling: Secondary | ICD-10-CM | POA: Diagnosis not present

## 2023-09-17 DIAGNOSIS — F17210 Nicotine dependence, cigarettes, uncomplicated: Secondary | ICD-10-CM | POA: Diagnosis not present

## 2023-09-17 DIAGNOSIS — F17200 Nicotine dependence, unspecified, uncomplicated: Secondary | ICD-10-CM | POA: Diagnosis not present

## 2023-09-17 MED ORDER — FE FUM-VIT C-VIT B12-FA 460-60-0.01-1 MG PO CAPS: 1.0000 | ORAL_CAPSULE | Freq: Every day | ORAL | 4 refills | Status: DC

## 2023-09-17 MED ORDER — VARENICLINE TARTRATE 0.5 MG PO TABS: ORAL_TABLET | ORAL | 0 refills | Status: AC

## 2023-09-17 MED ORDER — ATORVASTATIN CALCIUM 20 MG PO TABS: 20.0000 mg | ORAL_TABLET | Freq: Every day | ORAL | 3 refills | Status: AC

## 2023-09-17 NOTE — Assessment & Plan Note (Addendum)
 Smoking three cigarettes daily. Plans to obtain nicotine  patches. Discussed smoking's impact on health and interest in Chantix . Explained potential side effect of vivid dreams with Chantix . Approximately 10 minutes spent in counseling.  - Prescribe Chantix  for smoking cessation. - Advise to obtain nicotine  patches. - Refer to virtual smoking cessation classes. - Consider nicotine  gum as an alternative to smoking.

## 2023-09-17 NOTE — Progress Notes (Signed)
 Established Patient Office Visit  Subjective   Patient ID: [name] [name], female    DOB: 1975/10/16  Age: 48 y.o. MRN: [id number]  No chief complaint on file.   Discussed the use of AI scribe software for clinical note transcription with the patient, who gave verbal consent to proceed.  History of Present Illness [name] [name] is a 48 year old female who presents for a hospital follow-up after a recent dental procedure due to dental abscess.  She experienced significant jaw swelling and toothache, leading to hospitalization where two teeth were extracted and a drain was placed. She completed antibiotics with no new drainage or fever. Mild pain is controlled with Tylenol , and she has not used oxycodone .  She smokes three cigarettes daily and uses chlorhexidine  mouthwash three times a day to support oral hygiene and healing.  She takes warfarin, with her last 5 mg dose today, and plans to split her remaining 10 mg tablets until her next appointment for INR check on Friday. She is also on iron  supplements and needs a refill for atorvastatin , which she does not have at home.   Review of Systems  Constitutional:  Negative for chills, fever and malaise/fatigue.  Respiratory:  Negative for cough and shortness of breath.   Cardiovascular:  Negative for chest pain, palpitations, orthopnea and leg swelling.  Neurological:  Negative for dizziness and headaches.      Objective:     Pulse 73   Temp 98.3 F (36.8 C)   Ht 5' 10 (1.778 m)   Wt 229 lb 6.4 oz (104.1 kg)   SpO2 99%   BMI 32.92 kg/m    Physical Exam Constitutional:      Appearance: Normal appearance. She is obese.  HENT:     Head: Normocephalic and atraumatic.     Mouth/Throat:     Mouth: Mucous membranes are moist.     Pharynx: Oropharynx is clear.  Eyes:     Extraocular Movements: Extraocular movements intact.     Conjunctiva/sclera: Conjunctivae normal.  Cardiovascular:     Rate and Rhythm: Normal rate and  regular rhythm.     Heart sounds: No murmur heard.    Comments: LVAD mechanical noise auscultated  Pulmonary:     Effort: Pulmonary effort is normal.     Breath sounds: No wheezing, rhonchi or rales.  Musculoskeletal:     Right lower leg: No edema.     Left lower leg: No edema.  Skin:    General: Skin is warm and dry.  Neurological:     General: No focal deficit present.     Mental Status: She is alert and oriented to person, place, and time.  Psychiatric:        Mood and Affect: Mood normal.        Behavior: Behavior normal.     No results found for any visits on 09/17/23.  The ASCVD Risk score (Arnett DK, et al., 2019) failed to calculate for the following reasons:   Risk score cannot be calculated because patient has a medical history suggesting prior/existing ASCVD    Assessment & Plan:   Return as scheduled in November.   Encounter for smoking cessation counseling -     Varenicline  Tartrate; Take 1 tablet (0.5 mg total) by mouth daily for 3 days, THEN 2 tablets (1 mg total) daily for 4 days, THEN 2 tablets (1 mg total) 2 (two) times daily.  Dispense: 131 tablet; Refill: 0 -  Ambulatory referral to Virtual Care Smoking Cessation  Tobacco use disorder Assessment & Plan: Smoking three cigarettes daily. Plans to obtain nicotine  patches. Discussed smoking's impact on health and interest in Chantix . Explained potential side effect of vivid dreams with Chantix . Approximately 10 minutes spent in counseling.  - Prescribe Chantix  for smoking cessation. - Advise to obtain nicotine  patches. - Refer to virtual smoking cessation classes. - Consider nicotine  gum as an alternative to smoking.   Complex dental caries Assessment & Plan: Post-surgical dental pain managed with Tylenol . No need for oxycodone . No new drainage or fever.  - Continue Tylenol  for pain management. - Follow up with the dentist for post-surgical evaluation. - Warning signs such as new fever, worsening  pain, or drainage discussed.    Other orders -     Atorvastatin  Calcium ; Take 1 tablet (20 mg total) by mouth daily.  Dispense: 90 tablet; Refill: 3 -     Fe Fum-Vit C-Vit B12-FA; Take 1 capsule by mouth daily after breakfast.  Dispense: 90 capsule; Refill: 4     Jazzmon Prindle, PA-C

## 2023-09-17 NOTE — Assessment & Plan Note (Signed)
 Post-surgical dental pain managed with Tylenol . No need for oxycodone . No new drainage or fever.  - Continue Tylenol  for pain management. - Follow up with the dentist for post-surgical evaluation. - Warning signs such as new fever, worsening pain, or drainage discussed.

## 2023-09-19 ENCOUNTER — Ambulatory Visit (HOSPITAL_COMMUNITY): Payer: Self-pay | Admitting: Pharmacist

## 2023-09-19 ENCOUNTER — Ambulatory Visit (HOSPITAL_COMMUNITY)
Admission: RE | Admit: 2023-09-19 | Discharge: 2023-09-19 | Disposition: A | Discharge status: Home / Self Care | Source: Ambulatory Visit | Attending: Cardiology | Admitting: Cardiology

## 2023-09-19 DIAGNOSIS — Z95811 Presence of heart assist device: Secondary | ICD-10-CM | POA: Insufficient documentation

## 2023-09-19 DIAGNOSIS — Z7901 Long term (current) use of anticoagulants: Secondary | ICD-10-CM | POA: Insufficient documentation

## 2023-09-19 DIAGNOSIS — Z4801 Encounter for change or removal of surgical wound dressing: Secondary | ICD-10-CM | POA: Diagnosis not present

## 2023-09-19 LAB — PROTIME-INR
INR: 2.8 — ABNORMAL HIGH (ref 0.8–1.2)
Prothrombin Time: 30.6 s — ABNORMAL HIGH (ref 11.4–15.2)

## 2023-09-19 MED ORDER — WARFARIN SODIUM 5 MG PO TABS
5.0000 mg | ORAL_TABLET | Freq: Every day | ORAL | 11 refills | Status: DC
Start: 2023-09-19 — End: 2023-12-16

## 2023-09-19 NOTE — Progress Notes (Signed)
 Pt presents for dressing in VAD clinic alone. No issues with VAD equipment or drive line.  Pt tells me that she has recovered from her teeth extractions and that she has follow up with Dr Joanette next week.   The chest xray from last week has been read. Pt informed that she is cleared to drive.   Exit site care: Existing VAD dressing removed and site care performed using sterile technique. Drive line exit site cleaned with Chlora prep applicators x 2, allowed to dry, and gauze dressing applied. Exit site healing and partially incorporated, the velour is fully implanted at exit site. Small amount of serous drainage. No redness, tenderness, foul odor or rash noted. Cath grip anchor reapplied. Continue weekly dressing changes in VAD clinic using the daily kit. Pt has adequate dressing supplies at home.       Plan: Return in 1 week for dressing change.    Lauraine Ip RN, BSN VAD Coordinator 24/7 Pager [phone]

## 2023-09-26 ENCOUNTER — Encounter (HOSPITAL_COMMUNITY): Payer: Self-pay

## 2023-09-26 ENCOUNTER — Other Ambulatory Visit (HOSPITAL_COMMUNITY): Payer: Self-pay | Admitting: *Deleted

## 2023-09-26 ENCOUNTER — Ambulatory Visit (HOSPITAL_COMMUNITY)

## 2023-09-26 DIAGNOSIS — Z7901 Long term (current) use of anticoagulants: Secondary | ICD-10-CM

## 2023-09-26 DIAGNOSIS — Z95811 Presence of heart assist device: Secondary | ICD-10-CM

## 2023-10-06 ENCOUNTER — Telehealth (HOSPITAL_COMMUNITY): Payer: Self-pay

## 2023-10-06 NOTE — Telephone Encounter (Signed)
 Attempted to call pt to schedule dressing change and INR with no answer. VM left requesting follow up.  [name] Lunger RN, BSN VAD Coordinator 24/7 Pager [phone]

## 2023-10-20 ENCOUNTER — Encounter (HOSPITAL_COMMUNITY): Admitting: Cardiology

## 2023-10-21 ENCOUNTER — Other Ambulatory Visit (HOSPITAL_COMMUNITY): Payer: Self-pay | Admitting: *Deleted

## 2023-10-21 DIAGNOSIS — Z5181 Encounter for therapeutic drug level monitoring: Secondary | ICD-10-CM

## 2023-10-21 DIAGNOSIS — Z95811 Presence of heart assist device: Secondary | ICD-10-CM

## 2023-10-21 DIAGNOSIS — Z7901 Long term (current) use of anticoagulants: Secondary | ICD-10-CM

## 2023-10-21 DIAGNOSIS — I5022 Chronic systolic (congestive) heart failure: Secondary | ICD-10-CM

## 2023-10-21 NOTE — Progress Notes (Incomplete)
 LVAD CLINIC NOTE  Primary Care: [facility], [place], [place] Primary Cardiologist: Dorn Ross, MD HF Cardiologist: Dr. Gardenia EP: Eulas Furbish, MD  CC: Chronic systolic heart failure s/p HMIII LVAD  HPI: [name] [name] is a 48 y.o. female with hx of saddle PE/DVT s/p IVC filter (2015), HFrEF/NICM, pancytopenia, nonobstructive CAD, morbid obesity, noncompliance, pancytopenia (followed by hematology), tobacco use.  Admitted with acute on chronic CHF in 06/24. EF was down to 25-30%.   Had been considered for mTEER and possible LVAD but was lost to follow-up until recent readmission.  Admitted to [facility] ICU in 04/25 with cardiogenic shock. Transferred to [facility] with worsening hypotension and increased pressor requirements.  Echo 4/25 EF 20-25% severe RV dysfunction with septal flattening mod-severe MR. She was discharged with home inotrope with plan to workup for advanced therapies as an ouptatient if compliance improved. Discharge weight 244 lbs.  Underwent mTEER, complicated by torrential MR and damaged leaflet. Required IABP --> Impella 5.5 Underwent HM3 implant on 07/24/23 along with mitral valve replacement.  Interval hx:  Admitted recently with large dental abscess, required oral surgery debridement, initially on IV Unasyn  and then transition to Augmentin  for a total 10-day course.  Her swelling is markedly improved and she is otherwise doing well.  Her blood pressure is elevated in clinic today, but she notes that she is not taking any of her morning medications.  She did have 1 dizzy episode yesterday with change in position, but no other shortness of breath, orthostasis, orthopnea, chest pain.  Her sternal pain is significantly improved.     Current Outpatient Medications  Medication Sig Dispense Refill   acetaminophen  (TYLENOL ) 500 MG tablet Take 1 tablet (500 mg total) by mouth every 6 (six) hours as needed. 30 tablet 0   albuterol  (VENTOLIN  HFA) 108 (90 Base) MCG/ACT inhaler  Inhale 2 puffs into the lungs every 2 (two) hours as needed for wheezing or shortness of breath. 18 g 11   amiodarone  (PACERONE ) 200 MG tablet Take 1 tablet (200 mg total) by mouth daily. 90 tablet 4   atorvastatin  (LIPITOR) 20 MG tablet Take 1 tablet (20 mg total) by mouth daily. 90 tablet 3   chlorhexidine  (PERIDEX ) 0.12 % solution Use as directed 15 mLs in the mouth or throat 3 (three) times daily. 120 mL 0   digoxin  (LANOXIN ) 0.125 MG tablet Take 1 tablet (0.125 mg total) by mouth daily. 90 tablet 4   Fe Fum-Vit C-Vit B12-FA (TRIGELS-F FORTE) CAPS capsule Take 1 capsule by mouth daily after breakfast. 90 capsule 4   gabapentin  (NEURONTIN ) 300 MG capsule Take 1 capsule (300 mg total) by mouth 2 (two) times daily. 60 capsule 0   losartan  (COZAAR ) 25 MG tablet Take 2 tablets (50 mg total) by mouth daily. 90 tablet 3   oxyCODONE  (OXY IR/ROXICODONE ) 5 MG immediate release tablet Take 1 tablet (5 mg total) by mouth every 4 (four) hours as needed for severe pain (pain score 7-10). 15 tablet 0   pantoprazole  (PROTONIX ) 40 MG tablet Take 1 tablet (40 mg total) by mouth daily. 90 tablet 4   potassium chloride  SA (KLOR-CON  M) 20 MEQ tablet Take 1 tablet (20 mEq total) by mouth every other day. With Torsemide      senna-docusate (SENOKOT-S) 8.6-50 MG tablet Take 1 tablet by mouth daily. 90 tablet 4   spironolactone  (ALDACTONE ) 25 MG tablet Take 1 tablet (25 mg total) by mouth daily. 180 tablet 4   torsemide  (DEMADEX ) 20 MG tablet Take 2  tablets (40 mg total) by mouth daily.     traMADol  (ULTRAM ) 50 MG tablet Take 50-100 mg by mouth every 6 (six) hours as needed.     varenicline  (CHANTIX ) 0.5 MG tablet Take 1 tablet (0.5 mg total) by mouth daily for 3 days, THEN 2 tablets (1 mg total) daily for 4 days, THEN 2 tablets (1 mg total) 2 (two) times daily. 131 tablet 0   warfarin (COUMADIN ) 5 MG tablet Take 1 tablet (5 mg total) by mouth daily at 4 PM. 30 tablet 11   No current facility-administered medications  for this visit.     PHYSICAL EXAM: There were no vitals filed for this visit.   Physical Exam: GENERAL: no acute distress. CARDIAC:  Mechanical heart sounds with LVAD hum present. Systolic murmur. JVP flat LUNGS:  Clear to auscultation bilaterally.  ABDOMEN:  Soft, round, nontender LVAD exit site:   Dressing dry and intact.  No erythema or drainage.  Stabilization device present and accurately applied.  EXTREMITIES:  Warm and dry, no cyanosis, clubbing, rash or edema   Vital Signs:  Doppler Pressure: 116 Automatic BP: 115/92 (101) Repeat: 109/89 (97) HR: 77 SPO2: 99   Weight:  236.2 lb w/ eqt Discharge weight: 228 lb Last weight: 235.4 lb   BMI today 33.9 today   VAD Indication: Destination Therapy- smoking   HM3 assessment:   Speed: 6000 rpms Flow: 4.1 Power: 4.8 w    PI: 3.3   Alarms: none Events: 0-5  Fixed speed: 6000 Low speed limit: 5700   Primary controller: back up battery due for replacement in 33 months Secondary controller:  back up battery due for replacement in 33 months     I reviewed the LVAD parameters from today and compared the results to the patient's prior recorded data. LVAD interrogation was NEGATIVE for significant power changes, NEGATIVE for clinical alarms and STABLE for PI events/speed drops. No programming changes were made and pump is functioning within specified parameters. Pt is performing daily controller and system monitor self tests along with completing weekly and monthly maintenance for LVAD equipment.   LVAD equipment check completed and is in good working order. Back-up equipment present. Charged back up battery and performed self-test on equipment.    Annual Equipment Maintenance on UBC/PM was performed on 08/05/23.   Wt Readings from Last 3 Encounters:  09/17/23 104.1 kg (229 lb 6.4 oz)  09/12/23 103.9 kg (229 lb)  09/12/23 107.1 kg (236 lb 3.2 oz)    DATA REVIEW   ECHO: 09/24/21: LVEF 30-35%, RV function mildly reduced,  severe posterior MR 8/23: LVEF 30-35%, LVID 7.2cm w/ grade III DD, moderate to severe MR. Severely dilated LA.  09/03/17: LVEF 60%-65%.  TEE 2/24: severe eccentric MR, moderately reduced LV function.  07/03/22: LVEF 25-30% 05/07/23: LVEF 25-30%, grade III DD, RV moderately reduced, moderate posterior MR 05/16/23: LVEF 25-30%, RV moderately reduced, moderate to severe MR   CATH: 06/19/21: 40 to 50% proximal right coronary narrowing. Right dominant anatomy Coronary arteries otherwise normal Significant elevation LVEDP 30 mmHg consistent with acute on chronic combined systolic and diastolic heart failure Mild pulmonary hypertension, mean pressure 30 mmHg.  WHO group II etiology based on hemodysnamics.. Capillary wedge mean 24 mmHg.  V wave to 40 mmHg suggesting some degree of mitral regurgitation. Cardiac output 8.5 L/min with index 3.57 L/min/m. Pulmonary resistance 0.7 Wood units.  RHC on 7.5 DBA 05/13/23: HEMODYNAMICS: RA:       19 mmHg (mean) RV:  90/9, 19 mmHg PA:       90/38 mmHg (55 mean) PCWP: 27 mmHg (mean) with v waves to 45                                      Estimated Fick CO/CI   6.6L/min, 2.74L/min/m2         Thermodilution CO/CI   6.4L/min, 2.66L/min/m2                                      TPG  28  mmHg                                               PVR  4.375 Wood Units  PAPi  2.73    ASSESSMENT & PLAN: Chronic systolic HF with biventricular dysfunction/stage D cardiomyopathy s/p HM3 07/24/2023 with concurrent MVR - Tolerated well, long inotrope wean for RV dysfunction but off within 2 weeks - NYHA class II, stable since last visit - MAP elevated today, but recent increase in losartan  and did not take medications today. Also given orthostatic episode yesterday will continue current regimen - At next visit would likely transition to entresto. Instructed to take medications prior to visit and to call with LVAD alarms - Continue losartan  50 mg daily -Continue  spironolactone  25 mg daily, torsemide  40 mg daily -Continue digoxin  0.125 mg daily, labs drawn today - INR 2, goal 2-3, discussed with pharmD - LDH 244, mild increase. Will monitor - Dig level 0.7 - Follow up in 1 month    2. Severe MR s/p MVR - Echo 4/25 with severe MR  - TEER on 07/09/23 complicated by worsening MR - S/p MVR   3. Hx of PE/DVT w/ IVC filter - Evaluated at [facility] where thrombophilia work-up was negative (Lupus inhibitor, anticardiolipin antibodies, and anti beta-2  glycoprotein antibodies were negative; protein C activity 85%, protein S activity 76%, factor V Leiden, and prothrombin gene mutation ) - IVC filter in place; could not be removed due to significant thrombus burden on filter at [facility].  - Continue Warfarin as above   4. Obesity  - There is no height or weight on file to calculate BMI.  - BMI significantly improved, will need GLP-1 later if considering txp   5. CAD - LHC in 5/23 w/ nonobstructive CAD involving RCA - Inferior WMA on most recent echo - stable; no chest pain.    6. Microcytic anemia - Mild iron  deficiency noted on recent labs  7. Tobacco use - discussed importance of cessation with regards to transplant in the future.   I spent 52 minutes caring for this patient today including face to face time, ordering and reviewing labs, reviewing records from recent hospital stay, seeing the patient, documenting in the record, and arranging follow ups.    Morene Brownie, MD 2:22 PM

## 2023-10-22 ENCOUNTER — Encounter (HOSPITAL_COMMUNITY): Admitting: Cardiology

## 2023-10-27 ENCOUNTER — Encounter (HOSPITAL_COMMUNITY): Admitting: Cardiology

## 2023-10-27 ENCOUNTER — Encounter (HOSPITAL_COMMUNITY): Payer: Self-pay | Admitting: Unknown Physician Specialty

## 2023-10-30 ENCOUNTER — Telehealth: Payer: Self-pay | Admitting: Pharmacist

## 2023-10-30 NOTE — Telephone Encounter (Signed)
 Called patient for INR lab reminder with no reply. Left voicemail.

## 2023-11-03 NOTE — Telephone Encounter (Signed)
 Called patient for INR lab reminder with no reply. Left voicemail.

## 2023-11-11 ENCOUNTER — Ambulatory Visit (HOSPITAL_COMMUNITY): Admitting: Cardiology

## 2023-11-11 ENCOUNTER — Encounter (HOSPITAL_COMMUNITY): Payer: Self-pay

## 2023-12-05 ENCOUNTER — Encounter (HOSPITAL_COMMUNITY): Payer: Self-pay | Admitting: *Deleted

## 2023-12-05 ENCOUNTER — Telehealth (HOSPITAL_COMMUNITY): Payer: Self-pay | Admitting: *Deleted

## 2023-12-05 NOTE — Telephone Encounter (Signed)
 Pt has no showed multiple appointments. VAD coordinators have reached out several times with no answer. Called again today requesting call back to schedule follow up appts- no answer. Left voicemail. Letter sent today.   Called pt's mother [name] re: the above. Left voicemail message requesting call back to clinic.   [name] RN VAD Coordinator  Office: [phone]  24/7 Pager: [phone]

## 2023-12-16 ENCOUNTER — Other Ambulatory Visit: Payer: Self-pay

## 2023-12-16 ENCOUNTER — Other Ambulatory Visit (HOSPITAL_COMMUNITY)
Admission: RE | Admit: 2023-12-16 | Discharge: 2023-12-16 | Disposition: A | Source: Ambulatory Visit | Attending: Physician Assistant | Admitting: Physician Assistant

## 2023-12-16 ENCOUNTER — Ambulatory Visit (INDEPENDENT_AMBULATORY_CARE_PROVIDER_SITE_OTHER): Admitting: Physician Assistant

## 2023-12-16 ENCOUNTER — Encounter: Payer: Self-pay | Admitting: Physician Assistant

## 2023-12-16 VITALS — Ht 70.0 in | Wt 238.5 lb

## 2023-12-16 DIAGNOSIS — Z124 Encounter for screening for malignant neoplasm of cervix: Secondary | ICD-10-CM | POA: Insufficient documentation

## 2023-12-16 DIAGNOSIS — Z01419 Encounter for gynecological examination (general) (routine) without abnormal findings: Secondary | ICD-10-CM | POA: Diagnosis present

## 2023-12-16 DIAGNOSIS — R7303 Prediabetes: Secondary | ICD-10-CM

## 2023-12-16 DIAGNOSIS — Z0001 Encounter for general adult medical examination with abnormal findings: Secondary | ICD-10-CM | POA: Diagnosis not present

## 2023-12-16 DIAGNOSIS — I10 Essential (primary) hypertension: Secondary | ICD-10-CM

## 2023-12-16 DIAGNOSIS — Z0000 Encounter for general adult medical examination without abnormal findings: Secondary | ICD-10-CM

## 2023-12-16 DIAGNOSIS — Z1151 Encounter for screening for human papillomavirus (HPV): Secondary | ICD-10-CM | POA: Insufficient documentation

## 2023-12-16 DIAGNOSIS — Z23 Encounter for immunization: Secondary | ICD-10-CM

## 2023-12-16 MED ORDER — GABAPENTIN 300 MG PO CAPS: 300.0000 mg | ORAL_CAPSULE | Freq: Two times a day (BID) | ORAL | 1 refills | Status: DC

## 2023-12-16 MED ORDER — WARFARIN SODIUM 5 MG PO TABS: 5.0000 mg | ORAL_TABLET | Freq: Every day | ORAL | 11 refills | Status: DC

## 2023-12-16 MED ORDER — FE FUM-VIT C-VIT B12-FA 460-60-0.01-1 MG PO CAPS: 1.0000 | ORAL_CAPSULE | Freq: Every day | ORAL | 4 refills | Status: DC

## 2023-12-16 MED ORDER — POTASSIUM CHLORIDE CRYS ER 20 MEQ PO TBCR: 20.0000 meq | EXTENDED_RELEASE_TABLET | ORAL | 3 refills | Status: AC

## 2023-12-16 NOTE — Patient Instructions (Signed)
 Please call [name] [name] Mammography/Bone density scheduling [phone]

## 2023-12-16 NOTE — Progress Notes (Signed)
 Complete physical exam  Patient: [name] [name]   DOB: 10/08/1975   [name]  MRN: [id number]  Subjective:    Chief Complaint  Patient presents with   Annual Exam    Patient is here for physical and a pap.  Needs medication refilled     [name] [name] is a 48 y.o. female who presents today for a complete physical exam. She reports consuming a heart healthy diet. She is active 3-4 times a week for 20-30 minutes at a time. She generally feels well. She reports sleeping fairly well. She does not have additional problems to discuss today.    Most recent fall risk assessment:    12/16/2023   10:07 AM  Fall Risk   Falls in the past year? 0  Number falls in past yr: 0  Injury with Fall? 0  Follow up Falls evaluation completed     Most recent depression screenings:    12/16/2023   10:07 AM 09/12/2023    3:57 PM  PHQ 2/9 Scores  PHQ - 2 Score 0 0  PHQ- 9 Score 2     Vision:Not within last year  and is scheduling an appointment for the near future and Dental: No current dental problems and Receives regular dental care  Patient Care Team: Lourine Alberico, [place], [place] as PCP - General (Physician Assistant) Mealor, Eulas BRAVO, MD as PCP - Electrophysiology (Cardiology) Gardenia Led, DO as PCP - Cardiology (Cardiology) Wonda Sharper, MD as PCP - Structural Heart (Cardiology) Shaaron Lamar HERO, MD as Consulting Physician (Gastroenterology)   Outpatient Medications Prior to Visit  Medication Sig   acetaminophen  (TYLENOL ) 500 MG tablet Take 1 tablet (500 mg total) by mouth every 6 (six) hours as needed.   albuterol  (VENTOLIN  HFA) 108 (90 Base) MCG/ACT inhaler Inhale 2 puffs into the lungs every 2 (two) hours as needed for wheezing or shortness of breath.   amiodarone  (PACERONE ) 200 MG tablet Take 1 tablet (200 mg total) by mouth daily.   atorvastatin  (LIPITOR) 20 MG tablet Take 1 tablet (20 mg total) by mouth daily.   digoxin  (LANOXIN ) 0.125 MG tablet Take 1 tablet (0.125  mg total) by mouth daily.   losartan  (COZAAR ) 25 MG tablet Take 2 tablets (50 mg total) by mouth daily.   oxyCODONE  (OXY IR/ROXICODONE ) 5 MG immediate release tablet Take 1 tablet (5 mg total) by mouth every 4 (four) hours as needed for severe pain (pain score 7-10).   pantoprazole  (PROTONIX ) 40 MG tablet Take 1 tablet (40 mg total) by mouth daily.   senna-docusate (SENOKOT-S) 8.6-50 MG tablet Take 1 tablet by mouth daily.   spironolactone  (ALDACTONE ) 25 MG tablet Take 1 tablet (25 mg total) by mouth daily.   torsemide  (DEMADEX ) 20 MG tablet Take 2 tablets (40 mg total) by mouth daily.   traMADol  (ULTRAM ) 50 MG tablet Take 50-100 mg by mouth every 6 (six) hours as needed.   [DISCONTINUED] chlorhexidine  (PERIDEX ) 0.12 % solution Use as directed 15 mLs in the mouth or throat 3 (three) times daily.   [DISCONTINUED] Fe Fum-Vit C-Vit B12-FA (TRIGELS-F FORTE) CAPS capsule Take 1 capsule by mouth daily after breakfast.   [DISCONTINUED] gabapentin  (NEURONTIN ) 300 MG capsule Take 1 capsule (300 mg total) by mouth 2 (two) times daily.   [DISCONTINUED] potassium chloride  SA (KLOR-CON  M) 20 MEQ tablet Take 1 tablet (20 mEq total) by mouth every other day. With Torsemide    [DISCONTINUED] warfarin (COUMADIN ) 5 MG tablet Take 1 tablet (5 mg total)  by mouth daily at 4 PM.   No facility-administered medications prior to visit.    Review of Systems  Constitutional:  Negative for chills, fever and malaise/fatigue.  Eyes:  Negative for blurred vision and double vision.  Respiratory:  Negative for cough and shortness of breath.   Cardiovascular:  Negative for chest pain and palpitations.  Musculoskeletal:  Negative for joint pain and myalgias.  Neurological:  Negative for dizziness and headaches.  Psychiatric/Behavioral:  Negative for depression. The patient is not nervous/anxious.        Objective:     Ht 5' 10 (1.778 m)   Wt 238 lb 8 oz (108.2 kg)   BMI 34.22 kg/m   Physical Exam Constitutional:       General: She is not in acute distress.    Appearance: Normal appearance. She is obese. She is not ill-appearing.  HENT:     Head: Normocephalic and atraumatic.     Mouth/Throat:     Mouth: Mucous membranes are moist.     Pharynx: Oropharynx is clear.  Eyes:     Extraocular Movements: Extraocular movements intact.     Conjunctiva/sclera: Conjunctivae normal.  Cardiovascular:     Rate and Rhythm: Normal rate and regular rhythm.     Heart sounds: Normal heart sounds. No murmur heard.    Comments: LVAD mechanical noise appreciated Pulmonary:     Effort: Pulmonary effort is normal.     Breath sounds: Normal breath sounds.  Musculoskeletal:     Right lower leg: No edema.     Left lower leg: No edema.  Skin:    General: Skin is warm and dry.  Neurological:     General: No focal deficit present.     Mental Status: She is alert and oriented to person, place, and time.  Psychiatric:        Mood and Affect: Mood normal.        Behavior: Behavior normal.      No results found for any visits on 12/16/23.    Assessment & Plan:    Routine Health Maintenance and Physical Exam  Health Maintenance  Topic Date Due   Medicare Annual Wellness Visit  Never done   COVID-19 Vaccine (1) Never done   Hepatitis B Vaccine (1 of 3 - 19+ 3-dose series) Never done   Pap with HPV screening  03/12/2023   Flu Shot  08/22/2023   Breast Cancer Screening  02/08/2024   DTaP/Tdap/Td vaccine (2 - Td or Tdap) 02/24/2025   Colon Cancer Screening  06/07/2028   Pneumococcal Vaccine  Completed   Hepatitis C Screening  Completed   HIV Screening  Completed   HPV Vaccine  Aged Out   Meningitis B Vaccine  Aged Out    Discussed health benefits of physical activity, and encouraged her to engage in regular exercise appropriate for her age and condition.  Problem List Items Addressed This Visit     Essential hypertension   Relevant Medications   warfarin (COUMADIN ) 5 MG tablet   Other Relevant Orders    Lipid panel   CMP14+EGFR   CBC with Differential/Platelet   Prediabetes   Relevant Orders   HgB A1c   Other Visit Diagnoses       Annual visit for general adult medical examination without abnormal findings    -  Primary     Screening for cervical cancer       Relevant Orders   Cytology - PAP     Screening  for human papillomavirus (HPV)       Relevant Orders   Cytology - PAP      Return in about 6 months (around 06/14/2024).  Safety measures discussed: wears seatbelt 100% of time Immunizations reviewed: flu shot today Diet and exercise/ lifestyle modifications discussed: Recommend 150 minutes per week of exercise such as walking. Recommend lots of fresh produce to include fruits, vegetables, beans, healthy fats such as avocado, nuts, seeds, and 3-6 ounces of protein at each meal.  Avoid fried foods and fast food. Limit alcohol  consumption: no more than one drink per day for women and 2 drinks per day for men.  Stress management discussed. Discussed smoking cessation.  Routine vision and dental screening discussed: recommend dentist every 6 months, gets vision checked every 1-2 years.  Health maintenance: Pap today, number for mammogram scheduling provided today.  Questions answered.       Charmaine Dene Landsberg, PA-C

## 2023-12-17 LAB — LIPID PANEL
Chol/HDL Ratio: 2.4 ratio (ref 0.0–4.4)
Cholesterol, Total: 150 mg/dL (ref 100–199)
HDL: 62 mg/dL (ref >39)
LDL Chol Calc (NIH): 78 mg/dL (ref 0–99)
Triglycerides: 47 mg/dL (ref 0–149)
VLDL Cholesterol Cal: 10 mg/dL (ref 5–40)

## 2023-12-17 LAB — CBC WITH DIFFERENTIAL/PLATELET
Basophils Absolute: 0 x10E3/uL (ref 0.0–0.2)
Basos: 1 %
EOS (ABSOLUTE): 0.2 x10E3/uL (ref 0.0–0.4)
Eos: 3 %
Hematocrit: 42.1 % (ref 34.0–46.6)
Hemoglobin: 13.4 g/dL (ref 11.1–15.9)
Immature Grans (Abs): 0 x10E3/uL (ref 0.0–0.1)
Immature Granulocytes: 0 %
Lymphocytes Absolute: 1.1 x10E3/uL (ref 0.7–3.1)
Lymphs: 21 %
MCH: 29.5 pg (ref 26.6–33.0)
MCHC: 31.8 g/dL (ref 31.5–35.7)
MCV: 93 fL (ref 79–97)
Monocytes Absolute: 0.4 x10E3/uL (ref 0.1–0.9)
Monocytes: 9 %
Neutrophils Absolute: 3.3 x10E3/uL (ref 1.4–7.0)
Neutrophils: 66 %
Platelets: 129 x10E3/uL — ABNORMAL LOW (ref 150–450)
RBC: 4.55 x10E6/uL (ref 3.77–5.28)
RDW: 15.4 % (ref 11.7–15.4)
WBC: 5 x10E3/uL (ref 3.4–10.8)

## 2023-12-17 LAB — CMP14+EGFR
ALT: 23 IU/L (ref 0–32)
AST: 32 IU/L (ref 0–40)
Albumin: 3.9 g/dL (ref 3.9–4.9)
Alkaline Phosphatase: 116 IU/L (ref 41–116)
BUN/Creatinine Ratio: 28 — ABNORMAL HIGH (ref 9–23)
BUN: 37 mg/dL — ABNORMAL HIGH (ref 6–24)
Bilirubin Total: 0.4 mg/dL (ref 0.0–1.2)
CO2: 27 mmol/L (ref 20–29)
Calcium: 9.7 mg/dL (ref 8.7–10.2)
Chloride: 96 mmol/L (ref 96–106)
Creatinine, Ser: 1.3 mg/dL — ABNORMAL HIGH (ref 0.57–1.00)
Globulin, Total: 4.2 g/dL (ref 1.5–4.5)
Glucose: 75 mg/dL (ref 70–99)
Potassium: 4.9 mmol/L (ref 3.5–5.2)
Sodium: 136 mmol/L (ref 134–144)
Total Protein: 8.1 g/dL (ref 6.0–8.5)
eGFR: 51 mL/min/1.73 — ABNORMAL LOW (ref >59)

## 2023-12-17 LAB — HEMOGLOBIN A1C
Est. average glucose Bld gHb Est-mCnc: 103 mg/dL
Hgb A1c MFr Bld: 5.2 % (ref 4.8–5.6)

## 2023-12-23 ENCOUNTER — Ambulatory Visit: Payer: Self-pay | Admitting: Physician Assistant

## 2023-12-23 DIAGNOSIS — A599 Trichomoniasis, unspecified: Secondary | ICD-10-CM

## 2023-12-23 LAB — CYTOLOGY - PAP
Chlamydia: NEGATIVE
Comment: NEGATIVE
Comment: NEGATIVE
Comment: NEGATIVE
Comment: NORMAL
Diagnosis: NEGATIVE
High risk HPV: NEGATIVE
Neisseria Gonorrhea: NEGATIVE
Trichomonas: POSITIVE — AB

## 2023-12-23 MED ORDER — METRONIDAZOLE 500 MG PO TABS: 500.0000 mg | ORAL_TABLET | Freq: Two times a day (BID) | ORAL | 0 refills | Status: AC

## 2023-12-25 ENCOUNTER — Telehealth (HOSPITAL_COMMUNITY): Payer: Self-pay

## 2023-12-25 NOTE — Telephone Encounter (Signed)
 Called patient about INR reminder. Left voicemail.

## 2023-12-31 ENCOUNTER — Telehealth (HOSPITAL_COMMUNITY): Payer: Self-pay | Admitting: *Deleted

## 2023-12-31 NOTE — Telephone Encounter (Signed)
 Received call from patient requesting to make a follow up appt for medication refills, dressing supplies, and to see a provider. States she has been doing fine. Has experienced a few episodes of drive line trauma, but site looks good. Scheduled for follow up appt 12/15 at 9 AM. She verbalized understanding of appt information, and that she will arrange transportation with her friend.  [name] RN VAD Coordinator  Office: [phone]  24/7 Pager: [phone]

## 2024-01-02 ENCOUNTER — Encounter (HOSPITAL_COMMUNITY): Payer: Self-pay | Admitting: Unknown Physician Specialty

## 2024-01-02 ENCOUNTER — Other Ambulatory Visit (HOSPITAL_COMMUNITY): Payer: Self-pay | Admitting: Unknown Physician Specialty

## 2024-01-02 DIAGNOSIS — Z95811 Presence of heart assist device: Secondary | ICD-10-CM

## 2024-01-02 DIAGNOSIS — Z7901 Long term (current) use of anticoagulants: Secondary | ICD-10-CM

## 2024-01-05 ENCOUNTER — Ambulatory Visit (HOSPITAL_COMMUNITY): Admission: RE | Admit: 2024-01-05 | Discharge: 2024-01-05 | Attending: Cardiology | Admitting: Cardiology

## 2024-01-05 ENCOUNTER — Ambulatory Visit (HOSPITAL_COMMUNITY): Payer: Self-pay | Admitting: Pharmacist

## 2024-01-05 VITALS — BP 84/71 | HR 82 | Wt 250.0 lb

## 2024-01-05 DIAGNOSIS — T827XXA Infection and inflammatory reaction due to other cardiac and vascular devices, implants and grafts, initial encounter: Secondary | ICD-10-CM

## 2024-01-05 DIAGNOSIS — Z86711 Personal history of pulmonary embolism: Secondary | ICD-10-CM

## 2024-01-05 DIAGNOSIS — Y848 Other medical procedures as the cause of abnormal reaction of the patient, or of later complication, without mention of misadventure at the time of the procedure: Secondary | ICD-10-CM | POA: Diagnosis not present

## 2024-01-05 DIAGNOSIS — D509 Iron deficiency anemia, unspecified: Secondary | ICD-10-CM | POA: Diagnosis not present

## 2024-01-05 DIAGNOSIS — Z95811 Presence of heart assist device: Secondary | ICD-10-CM | POA: Diagnosis not present

## 2024-01-05 DIAGNOSIS — Z7901 Long term (current) use of anticoagulants: Secondary | ICD-10-CM | POA: Diagnosis not present

## 2024-01-05 DIAGNOSIS — Z79899 Other long term (current) drug therapy: Secondary | ICD-10-CM | POA: Diagnosis not present

## 2024-01-05 DIAGNOSIS — Z95828 Presence of other vascular implants and grafts: Secondary | ICD-10-CM | POA: Diagnosis not present

## 2024-01-05 DIAGNOSIS — Z952 Presence of prosthetic heart valve: Secondary | ICD-10-CM | POA: Diagnosis not present

## 2024-01-05 DIAGNOSIS — Z86718 Personal history of other venous thrombosis and embolism: Secondary | ICD-10-CM | POA: Diagnosis not present

## 2024-01-05 DIAGNOSIS — Z5181 Encounter for therapeutic drug level monitoring: Secondary | ICD-10-CM | POA: Diagnosis not present

## 2024-01-05 DIAGNOSIS — I2510 Atherosclerotic heart disease of native coronary artery without angina pectoris: Secondary | ICD-10-CM | POA: Diagnosis not present

## 2024-01-05 DIAGNOSIS — Z6835 Body mass index (BMI) 35.0-35.9, adult: Secondary | ICD-10-CM | POA: Diagnosis not present

## 2024-01-05 DIAGNOSIS — I5022 Chronic systolic (congestive) heart failure: Secondary | ICD-10-CM | POA: Diagnosis present

## 2024-01-05 DIAGNOSIS — I428 Other cardiomyopathies: Secondary | ICD-10-CM | POA: Diagnosis not present

## 2024-01-05 DIAGNOSIS — Y718 Miscellaneous cardiovascular devices associated with adverse incidents, not elsewhere classified: Secondary | ICD-10-CM | POA: Diagnosis not present

## 2024-01-05 LAB — COMPREHENSIVE METABOLIC PANEL WITH GFR
ALT: 12 U/L (ref 0–44)
AST: 24 U/L (ref 15–41)
Albumin: 3.3 g/dL — ABNORMAL LOW (ref 3.5–5.0)
Alkaline Phosphatase: 83 U/L (ref 38–126)
Anion gap: 7 (ref 5–15)
BUN: 19 mg/dL (ref 6–20)
CO2: 30 mmol/L (ref 22–32)
Calcium: 8.7 mg/dL — ABNORMAL LOW (ref 8.9–10.3)
Chloride: 100 mmol/L (ref 98–111)
Creatinine, Ser: 1.21 mg/dL — ABNORMAL HIGH (ref 0.44–1.00)
GFR, Estimated: 56 mL/min — ABNORMAL LOW (ref >60)
Glucose, Bld: 91 mg/dL (ref 70–99)
Potassium: 4.6 mmol/L (ref 3.5–5.1)
Sodium: 137 mmol/L (ref 135–145)
Total Bilirubin: 0.8 mg/dL (ref 0.0–1.2)
Total Protein: 8.4 g/dL — ABNORMAL HIGH (ref 6.5–8.1)

## 2024-01-05 LAB — CBC
HCT: 39.9 % (ref 36.0–46.0)
Hemoglobin: 12.5 g/dL (ref 12.0–15.0)
MCH: 29.6 pg (ref 26.0–34.0)
MCHC: 31.3 g/dL (ref 30.0–36.0)
MCV: 94.5 fL (ref 80.0–100.0)
Platelets: 116 K/uL — ABNORMAL LOW (ref 150–400)
RBC: 4.22 MIL/uL (ref 3.87–5.11)
RDW: 16.2 % — ABNORMAL HIGH (ref 11.5–15.5)
WBC: 4.9 K/uL (ref 4.0–10.5)
nRBC: 0 % (ref 0.0–0.2)

## 2024-01-05 LAB — TSH: TSH: 1.69 u[IU]/mL (ref 0.350–4.500)

## 2024-01-05 LAB — PREALBUMIN: Prealbumin: 19 mg/dL (ref 18–38)

## 2024-01-05 LAB — PROTIME-INR
INR: 1.4 — ABNORMAL HIGH (ref 0.8–1.2)
Prothrombin Time: 17.5 s — ABNORMAL HIGH (ref 11.4–15.2)

## 2024-01-05 LAB — LACTATE DEHYDROGENASE: LDH: 176 U/L (ref 105–235)

## 2024-01-05 LAB — T4, FREE: Free T4: 1.06 ng/dL (ref 0.61–1.12)

## 2024-01-05 LAB — DIGOXIN LEVEL: Digoxin Level: <0.6 ng/mL — ABNORMAL LOW (ref 0.8–2.0)

## 2024-01-05 MED ORDER — CEFADROXIL 500 MG PO CAPS: 1000.0000 mg | ORAL_CAPSULE | Freq: Two times a day (BID) | ORAL | 0 refills | Status: DC

## 2024-01-05 MED ORDER — FE FUM-VIT C-VIT B12-FA 460-60-0.01-1 MG PO CAPS: 1.0000 | ORAL_CAPSULE | Freq: Every day | ORAL | 4 refills | Status: DC

## 2024-01-05 MED ORDER — PANTOPRAZOLE SODIUM 40 MG PO TBEC: 40.0000 mg | DELAYED_RELEASE_TABLET | Freq: Every day | ORAL | 4 refills | Status: AC

## 2024-01-05 NOTE — Progress Notes (Signed)
 Patient presents for 4 month f/u with 6 month Intermacs in VAD Clinic today with her mom [name]. Reports no problems with VAD equipment or concerns with drive line.  States that she has been feeling good since last visit. Denies lightheadedness, dizziness, falls, shortness of breath, and signs of bleeding. She has not had to use her rescue inhaler recently. PCP refilled pt's Tramadol  for PRN pain management.   States she is taking all medications as prescribed. Took medications prior to clinic appt. Taking Torsemide  40 mg daily. States she takes an extra 1/2 tablet every other day, and/or on days when she feels bloated. Weight up 14 lbs today. Fluid status stable per Dr Zenaida. Per Dr Zenaida- STOP Amiodarone  today. Medication list updated. Pt verbalized understanding of medication change.   Requesting refills for Protonix  and iron  supplement today. Refills sent to pharmacy as requested.   Pt states she has sustained several episodes of drive line trauma. She did not notify VAD coordinators at time trauma was sustained. States she ran out of dressing supplies a while ago and has been using what she can find at home. Had tampax pad wrapped around drive line today. No anchor in place. She is changing her own dressing every 2-3 days due to drainage. Dressing changed in clinic today. See below for documentation. Exit site red, with thick tan drainage. Tunnels 5 cm. Wound culture obtained today. Per Dr Zenaida pt to start Cefadroxil  1000 mg BID x 14 days. Prescription sent to pt's pharmacy. Pt verbalized understanding of antibiotic. Pt's mother has previously been trained to perform dressing changes, but no longer feels comfortable performing wound care. Plan for patient to come to VAD clinic MWF for wound care. Pt and mother verbalized understanding of wound care plan.   VAD interrogation reveals PUMP OFF alarms. Pt states this happened on Friday or Saturday because her batteries depleted and she did not have  backup equipment with her. She thinks pump was off maybe 10 minutes. Log files sent to [employer] today:  In clinic, the System Controller was connected to a Power Module and a Clock Synch was performed today at 9:13:00, ~ 1 day, 23 hours, 50 minutes after power restoration to the System Controller. We can calculate backwards to have an approximate time of when power was restored to the controller, ~Saturday, January 03, 2024, at 9:23:00 am. This would also be when the pump was restarted but we are unable to determine how long the pump was off. The last recorded date before this reset was 01/26/1998 18:46:04.  Indicating that another complete loss of power event occurred before the most recent one. This would have occurred 5 days, 18 hours, 46 minutes prior to the time we calculate above + the unknown pump off time. The Log File indicates that the EBB has been used 47 times.   Discussed that when pump stops that blood sits in the pump, and a clot may form if pump off for prolonged period of time, which may lead to a potentially catastrophic event (ie: stroke or death). Discussed importance of bringing backup equipment with her wherever she goes to prevent further pump stops. She verbalized understanding.   Vital Signs:  Doppler Pressure: 88 Automatic BP: 84/71 (77) HR: 82 SPO2: 98   Weight:  250 lb w/ eqt Last weight: 236.2 lb  BMI today 35.87 today   VAD Indication: Destination Therapy- smoking   HM3 assessment:   Speed: 6000 rpms Flow: 4.4 Power: 4.8 w  PI: 3.2  Alarms: PUMP OFF/LOW VOLTAGE/LOW FLOW alarms Events: rare  Fixed speed: 6000 Low speed limit: 5700  Primary controller: back up battery due for replacement in 29 months Secondary controller:  back up battery due for replacement in 33 months    I reviewed the LVAD parameters from today and compared the results to the patient's prior recorded data. LVAD interrogation was NEGATIVE for significant power changes, NEGATIVE for  clinical alarms and STABLE for PI events/speed drops. No programming changes were made and pump is functioning within specified parameters. Pt is performing daily controller and system monitor self tests along with completing weekly and monthly maintenance for LVAD equipment.   LVAD equipment check completed and is in good working order. Back-up equipment present. Charged back up battery and performed self-test on equipment.    Annual Equipment Maintenance on UBC/PM was performed on 08/05/23.   Exit Site Care: Existing VAD dressing removed and site care performed using sterile technique. Drive line exit site cleaned with Chlora prep applicators x 2, allowed to dry, and gauze dressing with Silverlon patch applied to exit site. Velour exposed 1 inch at exit site. Site tunnels 5 cm. Wound culture obtained. Unable to express drainage. Small amount of thick, tan drainage present at exit site when previous dressing removed. Redness noted directly at exit site. No tenderness tracking along drive line. No foul odor or rash present. Drive line anchor applied. Plan for pt to come to VAD clinic on MWF for wound care by VAD coordinators. Provided with 7 daily kits and 10 anchors for home use.        Significant Events on VAD Support:     BP & Labs:  MAP 88- Doppler is reflecting modified systolic   Hgb 12.5 - No S/S of bleeding. Specifically denies melena/BRBPR or nosebleeds.   LDH stable at 176 with established baseline of 189- 476. Denies tea-colored urine. No power elevations noted on interrogation.   6 month Intermacs follow up completed including:  Quality of Life, KCCQ-12, and Neurocognitive trail making.   Pt completed 1300 feet during 6 minute walk.  Back up controller:  11V backup battery charged during this visit.  Patient Goals: to prevent further drive line trauma, and to get drive line infection controlled  [place] Cardiomyopathy Questionnaire     01/05/2024   10:40 AM  08/04/2023    8:06 AM 06/19/2023    9:21 AM  KCCQ-12  1 a. Ability to shower/bathe Moderately limited Not at all limited Not at all limited  1 b. Ability to walk 1 block Slightly limited Slightly limited Slightly limited  1 c. Ability to hurry/jog Moderately limited Other, Did not do Extremely limited  2. Edema feet/ankles/legs 1-2 times a week Less than once a week Never over the past 2 weeks  3. Limited by fatigue 1-2 times a week At least once a day Less than once a week  4. Limited by dyspnea Never over the past 2 weeks Less than once a week 1-2 times a week  5. Sitting up / on 3+ pillows Never over the past 2 weeks Never over the past 2 weeks Never over the past 2 weeks  6. Limited enjoyment of life Not limited at all Slightly limited Slightly limited  7. Rest of life w/ symptoms Somewhat satisfied Mostly satisfied Not at all satisfied  8 a. Participation in hobbies Moderately limited Slightly limited Slightly limited  8 b. Participation in chores Moderately limited Slightly limited Slightly limited  8 c. Visiting family/friends N/A, did not do for other reasons Slightly limited Did not limit at all     Plan:  STOP Amiodarone  START Cefadroxil  1000 mg (2 tablets) twice daily for 14 days for drive line infection Coumadin  dosing per Nason PharmD Return to clinic on Monday-Wednesday-Friday schedule for wound care with VAD coordinators Return to clinic in 2 weeks for follow up with Dr Zenaida Please remember to bring your black bag with controller, clips, and 2 fully charged batteries in it wherever you go to prevent further pump stops  [name] RN VAD Coordinator  Office: [phone]  24/7 Pager: [phone]

## 2024-01-05 NOTE — Patient Instructions (Addendum)
 STOP Amiodarone  START Cefadroxil  1000 mg (2 tablets) twice daily for 14 days for drive line infection Coumadin  dosing per Nason PharmD Return to clinic on Monday-Wednesday-Friday schedule for wound care with VAD coordinators Return to clinic in 2 weeks for follow up with Dr Zenaida Please remember to bring your black bag with controller, clips, and 2 fully charged batteries in it wherever you go to prevent further pump stops

## 2024-01-05 NOTE — Progress Notes (Signed)
 LVAD CLINIC NOTE  Primary Care: [facility], [place], [place] Primary Cardiologist: Dorn Ross, MD HF Cardiologist: Dr. Gardenia EP: Eulas Furbish, MD   HPI: [name] [name] is a 48 y.o. female with hx of saddle PE/DVT s/p IVC filter (2015), HFrEF/NICM, pancytopenia, nonobstructive CAD, morbid obesity, noncompliance, pancytopenia (followed by hematology), tobacco use.  Admitted with acute on chronic CHF in 06/24. EF was down to 25-30%.   Had been considered for mTEER and possible LVAD but was lost to follow-up until recent readmission.  Admitted to [facility] ICU in 04/25 with cardiogenic shock. Transferred to [facility] with worsening hypotension and increased pressor requirements.  Echo 4/25 EF 20-25% severe RV dysfunction with septal flattening mod-severe MR. She was discharged with home inotrope with plan to workup for advanced therapies as an ouptatient if compliance improved. Discharge weight 244 lbs.  Underwent mTEER, complicated by torrential MR and damaged leaflet. Required IABP --> Impella 5.5 Underwent HM3 implant on 07/24/23 along with mitral valve replacement.  Post surgery was admitted with a dental abscess, required debridement and course of antibiotics.  Interval hx:  Patient has missed multiple appointments recently, reports that she was concerned that she might have to come into the hospital.  Her driveline has significant drainage but no significant tenderness.  She reports that she has been taking her medications as prescribed.  She overall states that she is feeling well, her breathing has been much improved.  She denies any bleeding, melena, chest discomfort.  LVAD interrogation revealed pump off alarms.  She reported that this happened over the weekend when she was out did not have her backup batteries with her.  She reports that the pump may have been off about 15 minutes.  Per interrogation, it appears this is happened multiple times, per interrogation, the emergency backup  battery has been used 47 times.  Discussion about potentially catastrophic events from the pump stopping.     Current Outpatient Medications  Medication Sig Dispense Refill   acetaminophen  (TYLENOL ) 500 MG tablet Take 1 tablet (500 mg total) by mouth every 6 (six) hours as needed. 30 tablet 0   atorvastatin  (LIPITOR) 20 MG tablet Take 1 tablet (20 mg total) by mouth daily. 90 tablet 3   cefadroxil  (DURICEF) 500 MG capsule Take 2 capsules (1,000 mg total) by mouth 2 (two) times daily for 14 days. 56 capsule 0   digoxin  (LANOXIN ) 0.125 MG tablet Take 1 tablet (0.125 mg total) by mouth daily. 90 tablet 4   gabapentin  (NEURONTIN ) 300 MG capsule Take 1 capsule (300 mg total) by mouth 2 (two) times daily. 60 capsule 1   losartan  (COZAAR ) 25 MG tablet Take 2 tablets (50 mg total) by mouth daily. 90 tablet 3   potassium chloride  SA (KLOR-CON  M) 20 MEQ tablet Take 1 tablet (20 mEq total) by mouth every other day. With Torsemide  30 tablet 3   spironolactone  (ALDACTONE ) 25 MG tablet Take 1 tablet (25 mg total) by mouth daily. 180 tablet 4   torsemide  (DEMADEX ) 20 MG tablet Take 2 tablets (40 mg total) by mouth daily.     traMADol  (ULTRAM ) 50 MG tablet Take 50-100 mg by mouth every 6 (six) hours as needed.     warfarin (COUMADIN ) 5 MG tablet Take 1 tablet (5 mg total) by mouth daily at 4 PM. 30 tablet 11   albuterol  (VENTOLIN  HFA) 108 (90 Base) MCG/ACT inhaler Inhale 2 puffs into the lungs every 2 (two) hours as needed for wheezing or shortness of breath. (Patient  not taking: Reported on 01/05/2024) 18 g 11   Fe Fum-Vit C-Vit B12-FA (TRIGELS-F FORTE) CAPS capsule Take 1 capsule by mouth daily after breakfast. 90 capsule 4   pantoprazole  (PROTONIX ) 40 MG tablet Take 1 tablet (40 mg total) by mouth daily. 90 tablet 4   senna-docusate (SENOKOT-S) 8.6-50 MG tablet Take 1 tablet by mouth daily. (Patient not taking: Reported on 01/05/2024) 90 tablet 4   No current facility-administered medications for this  encounter.     PHYSICAL EXAM: Vitals:   01/05/24 0915 01/05/24 0920  BP: (!) 88/0 (!) 84/71  Pulse: 82   SpO2: 98%      Physical Exam: General:  Well appearing. No resp difficulty Cor: Mechanical heart sounds with LVAD hum present. JVP flat, trace edema Lungs: Normal WOB Abdomen: soft, nontender, nondistended.  Driveline: Significant tan drainage, poorly dressed Neuro: alert & orientedx3, cranial nerves grossly intact. moves all 4 extremities w/o difficulty.    LVAD Documentation    01/05/2024  Device Info  LVAD Type: Heartmate III  Date of Implant: 07/24/2023  Therapy Type: Destination Therapy      01/05/2024  Vitals  Heart Rate: 82 BPM  Automatic BP: 84/71  Doppler MAP: 88 mmHg  SpO2: 98 %    Last 3 Weights Weight Weight  01/05/2024 113.399 kg 250 lb  12/16/2023 108.183 kg 238 lb 8 oz  09/17/2023 104.055 kg 229 lb 6.4 oz       01/05/2024  LVAD Paramaters  Speed: 6000 RPM  Flow: 4 LPM  PI: 3  Power: 5 Watts  Hematocrit: 42 %  Alarms: Low voltage hazard that resulted in pump off. Per log files at least 2 pump offs noted due to battery depletion  Events: none  Last Speed Change Date: 07/30/2023  Last Ramp Echo Date: 07/30/2023  Last Right Heart Cath Date: 06/02/2023  Bleeding History: No  Type of Dressing: Daily  Annual Maintenance Date: 08/05/2023    Labs    Units 01/05/24 1026 12/16/23 1042 09/19/23 1035 09/12/23 1245 09/08/23 0315  INR  1.4*  --  2.8* 2.0* 3.3*  LDH U/L 176  --   --  244* 143  HGB g/dL 87.4 86.5  --  87.4 88.6*  CREATININE mg/dL 8.78* 8.69*  --  9.00 8.82*        Wt Readings from Last 3 Encounters:  01/05/24 113.4 kg (250 lb)  12/16/23 108.2 kg (238 lb 8 oz)  09/17/23 104.1 kg (229 lb 6.4 oz)    DATA REVIEW   ECHO: 09/24/21: LVEF 30-35%, RV function mildly reduced, severe posterior MR 8/23: LVEF 30-35%, LVID 7.2cm w/ grade III DD, moderate to severe MR. Severely dilated LA.  09/03/17: LVEF 60%-65%.  TEE 2/24: severe  eccentric MR, moderately reduced LV function.  07/03/22: LVEF 25-30% 05/07/23: LVEF 25-30%, grade III DD, RV moderately reduced, moderate posterior MR 05/16/23: LVEF 25-30%, RV moderately reduced, moderate to severe MR   CATH: 06/19/21: 40 to 50% proximal right coronary narrowing. Right dominant anatomy Coronary arteries otherwise normal Significant elevation LVEDP 30 mmHg consistent with acute on chronic combined systolic and diastolic heart failure Mild pulmonary hypertension, mean pressure 30 mmHg.  WHO group II etiology based on hemodysnamics.. Capillary wedge mean 24 mmHg.  V wave to 40 mmHg suggesting some degree of mitral regurgitation. Cardiac output 8.5 L/min with index 3.57 L/min/m. Pulmonary resistance 0.7 Wood units.  RHC on 7.5 DBA 05/13/23: HEMODYNAMICS: RA:       19 mmHg (mean) RV:  90/9, 19 mmHg PA:       90/38 mmHg (55 mean) PCWP: 27 mmHg (mean) with v waves to 45                                      Estimated Fick CO/CI   6.6L/min, 2.74L/min/m2         Thermodilution CO/CI   6.4L/min, 2.66L/min/m2                                      TPG  28  mmHg                                               PVR  4.375 Wood Units  PAPi  2.73    ASSESSMENT & PLAN: Chronic systolic HF with biventricular dysfunction/stage D cardiomyopathy s/p HM3 07/24/2023 with concurrent MVR - Tolerated well, long inotrope wean for RV dysfunction but off within 2 weeks - NYHA class II currently - BP stable, has been taking her medications - Weight gain but volume status overall stable - Continue losartan  50 mg daily, consider transition to Entresto in the future -Continue torsemide  40 mg daily -Continue digoxin  0.125 mg daily -Continue spironolactone  25 mg daily -INR goal 2-3, 1.4-day, discussed dosing with pharmacy - LDH stable at 176 - Dig level undetectable today - Follow up in 2 weeks - Start cefadroxil  1000 mg twice daily for 14 days for driveline infection, Monday Wednesday Friday  driveline dressing changes here  Severe MR s/p MVR - Echo 4/25 with severe MR  - TEER on 07/09/23 complicated by worsening MR - S/p MVR   Hx of PE/DVT w/ IVC filter - Evaluated at [facility] where thrombophilia work-up was negative (Lupus inhibitor, anticardiolipin antibodies, and anti beta-2  glycoprotein antibodies were negative; protein C activity 85%, protein S activity 76%, factor V Leiden, and prothrombin gene mutation ) - IVC filter in place; could not be removed due to significant thrombus burden on filter at [facility].  - Continue Warfarin as above   Obesity  - Body mass index is 35.87 kg/m.  - BMI significantly improved, will need GLP-1 later if considering txp, weight is up - Discuss at next visit   CAD - LHC in 5/23 w/ nonobstructive CAD involving RCA - Inferior WMA on most recent echo - stable; no chest pain.     Microcytic anemia - Mild iron  deficiency noted on recent labs  Tobacco use - discussed importance of cessation with regards to transplant in the future.   I spent 51 minutes caring for this patient today including face to face time, ordering and reviewing labs, reviewing records from Dr. Gardenia, patient care, seeing the patient, documenting in the record, and arranging follow ups.     Morene Brownie, MD 12:51 PM

## 2024-01-06 LAB — T3, FREE: T3, Free: 2.2 pg/mL (ref 2.0–4.4)

## 2024-01-07 ENCOUNTER — Other Ambulatory Visit (HOSPITAL_COMMUNITY): Payer: Self-pay | Admitting: *Deleted

## 2024-01-07 ENCOUNTER — Ambulatory Visit (HOSPITAL_COMMUNITY): Admission: RE | Admit: 2024-01-07 | Discharge: 2024-01-07 | Attending: Cardiology

## 2024-01-07 DIAGNOSIS — Z4509 Encounter for adjustment and management of other cardiac device: Secondary | ICD-10-CM | POA: Insufficient documentation

## 2024-01-07 DIAGNOSIS — Z95811 Presence of heart assist device: Secondary | ICD-10-CM | POA: Insufficient documentation

## 2024-01-07 DIAGNOSIS — T827XXA Infection and inflammatory reaction due to other cardiac and vascular devices, implants and grafts, initial encounter: Secondary | ICD-10-CM | POA: Diagnosis not present

## 2024-01-07 DIAGNOSIS — I5020 Unspecified systolic (congestive) heart failure: Secondary | ICD-10-CM | POA: Insufficient documentation

## 2024-01-07 DIAGNOSIS — Z7901 Long term (current) use of anticoagulants: Secondary | ICD-10-CM

## 2024-01-07 LAB — AEROBIC CULTURE W GRAM STAIN (SUPERFICIAL SPECIMEN): Gram Stain: NONE SEEN

## 2024-01-07 MED ORDER — DIGOXIN 125 MCG PO TABS: 0.1250 mg | ORAL_TABLET | Freq: Every day | ORAL | 4 refills | Status: AC

## 2024-01-07 MED ORDER — FE FUM-VIT C-VIT B12-FA 460-60-0.01-1 MG PO CAPS: 1.0000 | ORAL_CAPSULE | Freq: Every day | ORAL | 4 refills | Status: AC

## 2024-01-07 NOTE — Progress Notes (Signed)
 Pt presents to VAD clinic for dressing change today alone.   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 Wound culture from 12/15 is pending. Discussed need for daily wound care by VAD coordinators due to increased drainage. Pt in agreement with plan.   Exit Site Care: Existing VAD dressing removed and site care performed using sterile technique. Drive line exit site cleaned with Chlora prep applicators x 2, allowed to dry. Exit site cleansed with VASHE moistened gauze x 2 and allowed to dry. VASHE moistened 2 x 2 packed into tunnel. Covered with gauze dressing with VASHE moistened Silverlon patch. Velour exposed 1 inch at exit site. Site tunnels 5 cm. Unable to express drainage. Moderate amount of thick, tan/brown drainage with slight foul odor present on previous dressing. No tenderness tracking along drive line/abdomen. No redness or rash present. Drive line anchor applied. Plan for pt to come to VAD clinic daily for wound care by VAD coordinators.      Plan:  Continue Cefadroxil  until we have your wound culture results Return to VAD clinic tomorrow for dressing change  [name] RN VAD Coordinator  Office: [phone]  24/7 Pager: [phone]

## 2024-01-08 ENCOUNTER — Ambulatory Visit (HOSPITAL_COMMUNITY)
Admission: RE | Admit: 2024-01-08 | Discharge: 2024-01-08 | Disposition: A | Discharge status: Home / Self Care | Source: Ambulatory Visit | Attending: Internal Medicine | Admitting: Internal Medicine

## 2024-01-08 ENCOUNTER — Telehealth (INDEPENDENT_AMBULATORY_CARE_PROVIDER_SITE_OTHER): Payer: Self-pay | Admitting: Infectious Diseases

## 2024-01-08 DIAGNOSIS — B9689 Other specified bacterial agents as the cause of diseases classified elsewhere: Secondary | ICD-10-CM | POA: Diagnosis not present

## 2024-01-08 DIAGNOSIS — Z95811 Presence of heart assist device: Secondary | ICD-10-CM | POA: Insufficient documentation

## 2024-01-08 DIAGNOSIS — Z4801 Encounter for change or removal of surgical wound dressing: Secondary | ICD-10-CM | POA: Diagnosis present

## 2024-01-08 DIAGNOSIS — T827XXA Infection and inflammatory reaction due to other cardiac and vascular devices, implants and grafts, initial encounter: Secondary | ICD-10-CM

## 2024-01-08 MED ORDER — AMOXICILLIN-POT CLAVULANATE 875-125 MG PO TABS: 1.0000 | ORAL_TABLET | Freq: Two times a day (BID) | ORAL | 0 refills | Status: DC

## 2024-01-08 NOTE — Telephone Encounter (Signed)
 Notified by Dr. 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   Primary implant HM3 for destination therapy 07/24/2023.  Last seen in August 2025 following admission with large dental abscess that required debridement - IV Unasyn  >> Augmentin  10d course   Phone call noted on 12/10 with several episodes of driveline trauma reported. Seen in VAD clinic 12/15 and started on cefadroxil  1000 mg BID for 2 weeks with culture of drainage and instructions to increase frequency of dressing changes with VASHE/CHG and daily dressings.. Noted to have exposed velour, unincorporated site and fairly extensive tunnel of 5 cm and milky drainage noted. She did not have any tenderness or signs of abdominal wall induration at the time nor systemic fevers.    Results for orders placed or performed during the hospital encounter of 01/05/24  Aerobic Culture w Gram Stain (superficial specimen)     Status: None   Collection Time: 01/05/24 10:25 AM   Specimen: Wound  Result Value Ref Range Status   Specimen Description WOUND  Final   Special Requests DRIVELINE  Final   Gram Stain   Final    NO WBC SEEN FEW GRAM POSITIVE COCCI IN PAIRS RARE GRAM NEGATIVE RODS    Culture   Final    FEW EIKENELLA CORRODENS Usually susceptible to penicillin and other beta lactam agents,quinolones,macrolides and tetracyclines. WITIHN MIXED ORGANISMS Performed at [facility], [address], [place], [place] [address]    Report Status 01/07/2024 FINAL  Final   *Note: Due to a large number of results and/or encounters for the requested time period, some results have not been displayed. A complete set of results can be found in Results Review.    Recommendations:  - stop cefadroxil   - start augmentin  875/125 mg BID - Would plan a 4 week course and reassess  - tunnel of 5 cm is pretty significant and without any signs of  acute induration of the abdominal tissue suspect this has been a more chronic process for her and likely need CT surgery opinion on debridement.  - Will coordinate new patient visit with ID team in 2-3 weeks to see how she does  - Agree with daily dressing changes  I personally spent a total of 20 minutes in the care of the patient today including reviewing separately obtained history from multiple encounters August - December; communicating with other health care professionals, documenting clinical information in the EHR, independently interpreting results from microbiology, CMP/CBC, photos uploaded in the record and coordinating care.   Corean Fireman, MSN, NP-C [facility] for Infectious Disease [facility]  [email or web address] Pager: [phone] Office: [phone] [facility] Main Line: [phone] *Secure Chat Communication Welcome

## 2024-01-08 NOTE — Progress Notes (Signed)
 Pt presents to VAD clinic for dressing change today alone.   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   Wound cultures growing obtained 12/15 now growing Eikenella Corrodens. Referral placed for Infectious Disease. Culture results discussed with Dr. Zenaida and Corean Fireman NP. Advised to switch to Augmentin  875/125 BID for 30 days.   Exit Site Care: Existing VAD dressing removed and site care performed using sterile technique. Drive line exit site cleaned with Chlora prep applicators x 2, allowed to dry. Exit site cleansed with VASHE moistened gauze x 2 and allowed to dry. VASHE moistened 2 x 2 packed into tunnel. Covered with gauze dressing with VASHE moistened Silverlon patch. Velour exposed 1 inch at exit site. Induration present above exit site. Site tunnels 5 cm. Unable to express drainage. Moderate amount of thick, tan/brown drainage with slight foul odor present on previous dressing. Tenderness present during packing. No tenderness tracking along drive line/abdomen. No redness or rash present. Drive line anchor applied.       Pt educated of new referral placed for Infectious Disease and the importance to attend. New prescription sent to [facility].   Plan:  Please pick up new prescription for Augmentin   Return to VAD clinic tomorrow for dressing change  [name] Lunger RN, BSN VAD Coordinator 24/7 Pager [phone]

## 2024-01-09 ENCOUNTER — Ambulatory Visit (HOSPITAL_COMMUNITY): Payer: Self-pay | Admitting: Pharmacist

## 2024-01-09 ENCOUNTER — Ambulatory Visit (HOSPITAL_COMMUNITY)
Admission: RE | Admit: 2024-01-09 | Discharge: 2024-01-09 | Disposition: A | Discharge status: Home / Self Care | Source: Ambulatory Visit | Attending: Internal Medicine | Admitting: Internal Medicine

## 2024-01-09 ENCOUNTER — Other Ambulatory Visit (HOSPITAL_COMMUNITY): Payer: Self-pay | Admitting: *Deleted

## 2024-01-09 ENCOUNTER — Other Ambulatory Visit (HOSPITAL_COMMUNITY): Payer: Self-pay

## 2024-01-09 ENCOUNTER — Encounter (HOSPITAL_COMMUNITY): Payer: Self-pay | Admitting: Oncology

## 2024-01-09 DIAGNOSIS — Z7901 Long term (current) use of anticoagulants: Secondary | ICD-10-CM

## 2024-01-09 DIAGNOSIS — Z95811 Presence of heart assist device: Secondary | ICD-10-CM | POA: Diagnosis present

## 2024-01-09 DIAGNOSIS — I5020 Unspecified systolic (congestive) heart failure: Secondary | ICD-10-CM

## 2024-01-09 LAB — PROTIME-INR
INR: 2.2 — ABNORMAL HIGH (ref 0.8–1.2)
Prothrombin Time: 25.7 s — ABNORMAL HIGH (ref 11.4–15.2)

## 2024-01-09 MED ORDER — FE FUM-VIT C-VIT B12-FA 460-60-0.01-1 MG PO CAPS
1.0000 | ORAL_CAPSULE | Freq: Every day | ORAL | 3 refills | Status: AC
  Filled 2024-01-09 – 2024-01-29 (×2): qty 30, 30d supply, fill #0

## 2024-01-09 MED ORDER — WARFARIN SODIUM 5 MG PO TABS: 5.0000 mg | ORAL_TABLET | Freq: Every day | ORAL | 5 refills | Status: AC

## 2024-01-09 NOTE — Progress Notes (Signed)
 Pt presents to VAD clinic for dressing change and INR today alone.   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 Wound cultures growing obtained 01/05/24 now growing Eikenella Corrodens. Referral placed for Infectious Disease. Culture results discussed with Dr. Zenaida and Corean Fireman NP on 01/08/24- Advised to switch to Augmentin  875/125 BID for 30 days.  Refill for Coumadin  sent to [facility] today. Pt requested Trigel prescription be sent to [facility]- refill sent today.  Exit Site Care: Existing VAD dressing removed and site care performed using sterile technique. Drive line exit site cleaned with Chlora prep applicators x 2, allowed to dry. Exit site cleansed with VASHE moistened gauze x 2 and allowed to dry. Tunnel opening around drive line is small-unable to pack 2 x 2 into space today. Covered with gauze dressing (several dry 4 x 4s) with VASHE moistened Silverlon patch. Covered entire dressing with 2 large tegaderms to ensure dressing remains intact over the weekend. Velour exposed 1 inch at exit site. Small area of induration present above exit site (new 12/18). Site tunnels 5 cm. Unable to express drainage. Small amount of thick, sticky, tan drainage with foul odor present on previous dressing. No tenderness tracking along drive line/abdomen. No redness or rash present. Drive line anchor applied. Provided with 7 daily dressings and 7 anchors for home use. Instructed to bring to clinic for dressing changes.      Plan:  Coumadin  dosing per Nason PharmD Return to clinic on Monday for dressing change with VAD coordinators May reinforce dressing over the weekend as needed  [name] RN VAD Coordinator  Office: [phone]  24/7 Pager: [phone]

## 2024-01-12 ENCOUNTER — Encounter (HOSPITAL_COMMUNITY): Payer: Self-pay | Admitting: Oncology

## 2024-01-12 ENCOUNTER — Encounter (HOSPITAL_COMMUNITY): Payer: Self-pay

## 2024-01-12 ENCOUNTER — Ambulatory Visit (HOSPITAL_COMMUNITY)

## 2024-01-12 ENCOUNTER — Other Ambulatory Visit (HOSPITAL_COMMUNITY): Payer: Self-pay

## 2024-01-13 ENCOUNTER — Ambulatory Visit (HOSPITAL_COMMUNITY)

## 2024-01-13 ENCOUNTER — Encounter (HOSPITAL_COMMUNITY): Payer: Self-pay

## 2024-01-14 ENCOUNTER — Encounter (HOSPITAL_COMMUNITY): Payer: Self-pay

## 2024-01-14 ENCOUNTER — Ambulatory Visit (HOSPITAL_COMMUNITY)

## 2024-01-16 ENCOUNTER — Ambulatory Visit (HOSPITAL_COMMUNITY)

## 2024-01-16 ENCOUNTER — Encounter (HOSPITAL_COMMUNITY): Payer: Self-pay

## 2024-01-20 ENCOUNTER — Other Ambulatory Visit (HOSPITAL_COMMUNITY): Payer: Self-pay

## 2024-01-20 DIAGNOSIS — Z7901 Long term (current) use of anticoagulants: Secondary | ICD-10-CM

## 2024-01-20 DIAGNOSIS — Z95811 Presence of heart assist device: Secondary | ICD-10-CM

## 2024-01-21 ENCOUNTER — Encounter (HOSPITAL_COMMUNITY): Payer: Self-pay

## 2024-01-21 ENCOUNTER — Ambulatory Visit (HOSPITAL_COMMUNITY): Admitting: Cardiology

## 2024-01-23 ENCOUNTER — Other Ambulatory Visit (HOSPITAL_COMMUNITY): Payer: Self-pay

## 2024-01-26 ENCOUNTER — Telehealth (HOSPITAL_COMMUNITY): Payer: Self-pay | Admitting: *Deleted

## 2024-01-26 ENCOUNTER — Other Ambulatory Visit (HOSPITAL_COMMUNITY): Payer: Self-pay | Admitting: *Deleted

## 2024-01-26 ENCOUNTER — Other Ambulatory Visit: Payer: Self-pay | Admitting: Cardiology

## 2024-01-26 DIAGNOSIS — Z95811 Presence of heart assist device: Secondary | ICD-10-CM

## 2024-01-26 DIAGNOSIS — T827XXA Infection and inflammatory reaction due to other cardiac and vascular devices, implants and grafts, initial encounter: Secondary | ICD-10-CM

## 2024-01-26 DIAGNOSIS — Z7901 Long term (current) use of anticoagulants: Secondary | ICD-10-CM

## 2024-01-26 NOTE — Telephone Encounter (Signed)
 Pt called and left voicemail message requesting to reschedule her appt with Dr Zenaida that she no-showed last week.  Appt scheduled 01/28/24 at 0900. Called and left voicemail for patient with appt information.   [name] RN VAD Coordinator  Office: [phone]  24/7 Pager: [phone]

## 2024-01-26 NOTE — Addendum Note (Signed)
 Addended by: BERDINE RAKE B on: 01/26/2024 09:02 AM   Modules accepted: Orders

## 2024-01-28 ENCOUNTER — Ambulatory Visit (HOSPITAL_COMMUNITY): Admitting: Cardiology

## 2024-01-28 ENCOUNTER — Encounter (HOSPITAL_COMMUNITY): Payer: Self-pay | Admitting: Unknown Physician Specialty

## 2024-01-29 ENCOUNTER — Other Ambulatory Visit: Payer: Self-pay

## 2024-01-29 ENCOUNTER — Encounter (HOSPITAL_COMMUNITY): Payer: Self-pay | Admitting: Oncology

## 2024-01-29 ENCOUNTER — Other Ambulatory Visit (HOSPITAL_COMMUNITY): Payer: Self-pay

## 2024-01-30 ENCOUNTER — Other Ambulatory Visit: Payer: Self-pay

## 2024-02-04 ENCOUNTER — Ambulatory Visit (HOSPITAL_BASED_OUTPATIENT_CLINIC_OR_DEPARTMENT_OTHER)
Admission: RE | Admit: 2024-02-04 | Discharge: 2024-02-04 | Disposition: A | Discharge status: Home / Self Care | Source: Ambulatory Visit | Attending: Cardiology | Admitting: Cardiology

## 2024-02-04 ENCOUNTER — Ambulatory Visit (HOSPITAL_COMMUNITY): Payer: Self-pay | Admitting: Pharmacist

## 2024-02-04 ENCOUNTER — Other Ambulatory Visit (HOSPITAL_COMMUNITY): Payer: Self-pay | Admitting: *Deleted

## 2024-02-04 VITALS — BP 92/0 | HR 75 | Wt 254.6 lb

## 2024-02-04 DIAGNOSIS — I5020 Unspecified systolic (congestive) heart failure: Secondary | ICD-10-CM

## 2024-02-04 DIAGNOSIS — I5022 Chronic systolic (congestive) heart failure: Secondary | ICD-10-CM | POA: Insufficient documentation

## 2024-02-04 DIAGNOSIS — Z86718 Personal history of other venous thrombosis and embolism: Secondary | ICD-10-CM | POA: Insufficient documentation

## 2024-02-04 DIAGNOSIS — D509 Iron deficiency anemia, unspecified: Secondary | ICD-10-CM | POA: Insufficient documentation

## 2024-02-04 DIAGNOSIS — Z7901 Long term (current) use of anticoagulants: Secondary | ICD-10-CM | POA: Insufficient documentation

## 2024-02-04 DIAGNOSIS — I5082 Biventricular heart failure: Secondary | ICD-10-CM | POA: Insufficient documentation

## 2024-02-04 DIAGNOSIS — Z79899 Other long term (current) drug therapy: Secondary | ICD-10-CM | POA: Insufficient documentation

## 2024-02-04 DIAGNOSIS — T827XXA Infection and inflammatory reaction due to other cardiac and vascular devices, implants and grafts, initial encounter: Secondary | ICD-10-CM | POA: Diagnosis not present

## 2024-02-04 DIAGNOSIS — I2510 Atherosclerotic heart disease of native coronary artery without angina pectoris: Secondary | ICD-10-CM | POA: Insufficient documentation

## 2024-02-04 DIAGNOSIS — Z95811 Presence of heart assist device: Secondary | ICD-10-CM

## 2024-02-04 DIAGNOSIS — Z95810 Presence of automatic (implantable) cardiac defibrillator: Secondary | ICD-10-CM | POA: Insufficient documentation

## 2024-02-04 DIAGNOSIS — Z952 Presence of prosthetic heart valve: Secondary | ICD-10-CM | POA: Insufficient documentation

## 2024-02-04 DIAGNOSIS — E669 Obesity, unspecified: Secondary | ICD-10-CM | POA: Insufficient documentation

## 2024-02-04 DIAGNOSIS — Z6836 Body mass index (BMI) 36.0-36.9, adult: Secondary | ICD-10-CM | POA: Insufficient documentation

## 2024-02-04 DIAGNOSIS — I428 Other cardiomyopathies: Secondary | ICD-10-CM | POA: Insufficient documentation

## 2024-02-04 DIAGNOSIS — Z86711 Personal history of pulmonary embolism: Secondary | ICD-10-CM | POA: Insufficient documentation

## 2024-02-04 LAB — CBC
HCT: 41.5 % (ref 36.0–46.0)
Hemoglobin: 13.5 g/dL (ref 12.0–15.0)
MCH: 29.9 pg (ref 26.0–34.0)
MCHC: 32.5 g/dL (ref 30.0–36.0)
MCV: 92 fL (ref 80.0–100.0)
Platelets: 150 K/uL (ref 150–400)
RBC: 4.51 MIL/uL (ref 3.87–5.11)
RDW: 15.5 % (ref 11.5–15.5)
WBC: 3.2 K/uL — ABNORMAL LOW (ref 4.0–10.5)
nRBC: 0 % (ref 0.0–0.2)

## 2024-02-04 LAB — BASIC METABOLIC PANEL WITH GFR
Anion gap: 7 (ref 5–15)
BUN: 36 mg/dL — ABNORMAL HIGH (ref 6–20)
CO2: 33 mmol/L — ABNORMAL HIGH (ref 22–32)
Calcium: 9.4 mg/dL (ref 8.9–10.3)
Chloride: 96 mmol/L — ABNORMAL LOW (ref 98–111)
Creatinine, Ser: 1.4 mg/dL — ABNORMAL HIGH (ref 0.44–1.00)
GFR, Estimated: 46 mL/min — ABNORMAL LOW
Glucose, Bld: 84 mg/dL (ref 70–99)
Potassium: 4.9 mmol/L (ref 3.5–5.1)
Sodium: 136 mmol/L (ref 135–145)

## 2024-02-04 LAB — PROTIME-INR
INR: 1.9 — ABNORMAL HIGH (ref 0.8–1.2)
Prothrombin Time: 22.6 s — ABNORMAL HIGH (ref 11.4–15.2)

## 2024-02-04 LAB — LACTATE DEHYDROGENASE: LDH: 227 U/L (ref 105–235)

## 2024-02-04 MED ORDER — ALBUTEROL SULFATE HFA 108 (90 BASE) MCG/ACT IN AERS: 2.0000 | INHALATION_SPRAY | RESPIRATORY_TRACT | 11 refills | Status: AC | PRN

## 2024-02-04 MED ORDER — AMOXICILLIN-POT CLAVULANATE 875-125 MG PO TABS: 1.0000 | ORAL_TABLET | Freq: Two times a day (BID) | ORAL | 0 refills | Status: DC

## 2024-02-04 NOTE — Progress Notes (Signed)
 Patient presents for 1 month f/u in VAD Clinic today with her mom [name]. Reports no problems with VAD equipment or concerns with drive line.  Pt has no showed multiple follow up appts to see Dr Zenaida and for VAD coordinators to perform wound care.   States that she has been much better after recovering from a bad cold. Denies lightheadedness, dizziness, falls, shortness of breath, and signs of bleeding. States she is out of refills for her Albuterol  inhaler. Prescription refilled today by Dr Zenaida. Denies any drive line pain. States she is taking all medications as prescribed.   States she is taking Augmentin  BID for drive line infection- denies missing any doses.  Pt states she has sustained several episodes of drive line trauma. She did not notify VAD coordinators at time trauma was sustained. States that her mom has been changing her drive line dressing daily/every few days. Unsure when pt ran out of dressing kits as she has paper towels taped to her exit site today.Dressing changed in clinic today. See below for documentation. Exit site thick, tan, foul smelling drainage. Wound does not tunnel. Plan for patient to come to VAD clinic MWF for wound care. Refill sent for Augmentin  today. Advised to continue taking BID. She verbalized understanding. Pt to call ID clinic to schedule follow up.   VAD interrogation reveals controller clock not set again today. Date/time reset on pump by VAD coordinator today. Suspicion that pt allowed batteries/BUB to fully deplete. Pt is a poor historian and could not provide much incite into what happened. Pt has utilized BUB 53 times with use of 2846 minutes. Log files sent to [employer]:  The log captured invalid controller clock alarms throughout the event history. We are not able to determine the cause of these alarms as that data has been overwritten; however, the first line in the file shows pump power at 0 indicating that the pump may have been off. If the bernadette  is that the patient has been letting batteries deplete until the controller shuts off, please advise the patient to swap batteries when they get to 3 bars or less (or when low power advisory alarms begin) and to sleep on wall power.There were no other unusual events recorded in the log file event history.   Discussed that when pump stops that blood sits in the pump, and a clot may form if pump off for prolonged period of time, which may lead to a potentially catastrophic event (ie: stroke or death). Discussed importance of bringing backup equipment with her wherever she goes to prevent further pump stops. She verbalized understanding.   Vital Signs:  Doppler Pressure: 92 Automatic BP: 101/67 (80) HR: 75 SPO2: 98%   Weight:  254.6 lb w/ eqt Last weight: 250 lb  BMI today 35.87 today   VAD Indication: Destination Therapy- smoking   HM3 assessment:   Speed: 6000 rpms Flow: 4.2 Power: 4.9 w    PI: 3.0  Alarms: controller clock not set-- see above Events: rare  Fixed speed: 6000 Low speed limit: 5700  Primary controller: back up battery due for replacement in 28 months Secondary controller:  back up battery due for replacement in 33 months    I reviewed the LVAD parameters from today and compared the results to the patient's prior recorded data. LVAD interrogation was NEGATIVE for significant power changes, NEGATIVE for clinical alarms and STABLE for PI events/speed drops. No programming changes were made and pump is functioning within specified parameters. Pt is  performing daily controller and system monitor self tests along with completing weekly and monthly maintenance for LVAD equipment.   LVAD equipment check completed and is in good working order. Back-up equipment present. Charged back up battery and performed self-test on equipment.    Annual Equipment Maintenance on UBC/PM was performed on 08/05/23.   Exit Site Care: Existing VAD dressing removed and site care performed  using sterile technique. Drive line exit site cleaned with Chlora prep applicators x 2, allowed to dry, and gauze dressing with Silverlon patch applied to exit site. Velour exposed 1 inch at exit site. Site does not tunnel. Unable to express drainage. Abdomen is not tender to palpation. Small amount of thick, tan, foul smelling drainage present at exit site/on previous dressing. No tenderness tracking along drive line. No rash present. Drive line anchor applied. Plan for pt to come to VAD clinic on MWF for wound care by VAD coordinators. Provided with 7 daily kits and 7 anchors for home use.       Significant Events on VAD Support:     BP & Labs:  MAP 92- Doppler is reflecting modified systolic   Hgb 13.5 - No S/S of bleeding. Specifically denies melena/BRBPR or nosebleeds.   LDH stable at 227 with established baseline of 189- 476. Denies tea-colored urine. No power elevations noted on interrogation.   Plan:  Continue taking Augmentin  twice daily Coumadin  dosing per Lauren PharmD Return to VAD clinic on Friday for dressing change Return to VAD clinic on Monday-Wednesday-Friday next week for dressing changes Return to VAD clinic in 1 month for follow up appt with Dr Zenaida Please remember to bring your black bag with controller, clips, and 2 fully charged batteries in it wherever you go to prevent further pump stops  [name] RN VAD Coordinator  Office: [phone]  24/7 Pager: [phone]

## 2024-02-04 NOTE — Patient Instructions (Addendum)
 Continue taking Augmentin  twice daily Coumadin  dosing per Lauren PharmD Return to VAD clinic on Friday for dressing change Return to VAD clinic on Monday-Wednesday-Friday next week for dressing changes Return to VAD clinic in 1 month for follow up appt with Dr Zenaida Please remember to bring your black bag with controller, clips, and 2 fully charged batteries in it wherever you go to prevent further pump stops

## 2024-02-06 ENCOUNTER — Inpatient Hospital Stay (HOSPITAL_COMMUNITY)

## 2024-02-06 ENCOUNTER — Encounter (HOSPITAL_COMMUNITY): Payer: Self-pay

## 2024-02-06 ENCOUNTER — Ambulatory Visit (HOSPITAL_COMMUNITY)
Admission: RE | Admit: 2024-02-06 | Discharge: 2024-02-06 | Disposition: A | Discharge status: Home / Self Care | Source: Ambulatory Visit | Attending: Cardiology | Admitting: Cardiology

## 2024-02-06 ENCOUNTER — Inpatient Hospital Stay (HOSPITAL_COMMUNITY)
Admission: AD | Admit: 2024-02-06 | Discharge: 2024-02-23 | DRG: 264 | Disposition: A | Discharge status: Home / Self Care | Source: Ambulatory Visit | Attending: Cardiology | Admitting: Cardiology

## 2024-02-06 ENCOUNTER — Other Ambulatory Visit (HOSPITAL_COMMUNITY): Payer: Self-pay

## 2024-02-06 DIAGNOSIS — Z95811 Presence of heart assist device: Principal | ICD-10-CM

## 2024-02-06 DIAGNOSIS — D509 Iron deficiency anemia, unspecified: Secondary | ICD-10-CM | POA: Diagnosis present

## 2024-02-06 DIAGNOSIS — T827XXA Infection and inflammatory reaction due to other cardiac and vascular devices, implants and grafts, initial encounter: Principal | ICD-10-CM | POA: Diagnosis present

## 2024-02-06 DIAGNOSIS — F17210 Nicotine dependence, cigarettes, uncomplicated: Secondary | ICD-10-CM | POA: Diagnosis present

## 2024-02-06 DIAGNOSIS — Z952 Presence of prosthetic heart valve: Secondary | ICD-10-CM

## 2024-02-06 DIAGNOSIS — Z8249 Family history of ischemic heart disease and other diseases of the circulatory system: Secondary | ICD-10-CM

## 2024-02-06 DIAGNOSIS — I110 Hypertensive heart disease with heart failure: Secondary | ICD-10-CM | POA: Diagnosis present

## 2024-02-06 DIAGNOSIS — Z792 Long term (current) use of antibiotics: Secondary | ICD-10-CM

## 2024-02-06 DIAGNOSIS — Y831 Surgical operation with implant of artificial internal device as the cause of abnormal reaction of the patient, or of later complication, without mention of misadventure at the time of the procedure: Secondary | ICD-10-CM | POA: Diagnosis present

## 2024-02-06 DIAGNOSIS — Z4509 Encounter for adjustment and management of other cardiac device: Secondary | ICD-10-CM | POA: Insufficient documentation

## 2024-02-06 DIAGNOSIS — I252 Old myocardial infarction: Secondary | ICD-10-CM

## 2024-02-06 DIAGNOSIS — Z5181 Encounter for therapeutic drug level monitoring: Secondary | ICD-10-CM

## 2024-02-06 DIAGNOSIS — Z882 Allergy status to sulfonamides status: Secondary | ICD-10-CM

## 2024-02-06 DIAGNOSIS — I5082 Biventricular heart failure: Secondary | ICD-10-CM | POA: Diagnosis present

## 2024-02-06 DIAGNOSIS — Z833 Family history of diabetes mellitus: Secondary | ICD-10-CM

## 2024-02-06 DIAGNOSIS — Z86718 Personal history of other venous thrombosis and embolism: Secondary | ICD-10-CM

## 2024-02-06 DIAGNOSIS — Z95828 Presence of other vascular implants and grafts: Secondary | ICD-10-CM

## 2024-02-06 DIAGNOSIS — I2510 Atherosclerotic heart disease of native coronary artery without angina pectoris: Secondary | ICD-10-CM | POA: Diagnosis present

## 2024-02-06 DIAGNOSIS — Z6836 Body mass index (BMI) 36.0-36.9, adult: Secondary | ICD-10-CM

## 2024-02-06 DIAGNOSIS — N611 Abscess of the breast and nipple: Secondary | ICD-10-CM | POA: Diagnosis present

## 2024-02-06 DIAGNOSIS — A498 Other bacterial infections of unspecified site: Secondary | ICD-10-CM | POA: Insufficient documentation

## 2024-02-06 DIAGNOSIS — I428 Other cardiomyopathies: Secondary | ICD-10-CM | POA: Diagnosis present

## 2024-02-06 DIAGNOSIS — Z86711 Personal history of pulmonary embolism: Secondary | ICD-10-CM

## 2024-02-06 DIAGNOSIS — Z7901 Long term (current) use of anticoagulants: Secondary | ICD-10-CM

## 2024-02-06 DIAGNOSIS — Z79899 Other long term (current) drug therapy: Secondary | ICD-10-CM

## 2024-02-06 DIAGNOSIS — Z91199 Patient's noncompliance with other medical treatment and regimen due to unspecified reason: Secondary | ICD-10-CM

## 2024-02-06 DIAGNOSIS — I5084 End stage heart failure: Secondary | ICD-10-CM | POA: Diagnosis present

## 2024-02-06 DIAGNOSIS — A429 Actinomycosis, unspecified: Secondary | ICD-10-CM | POA: Diagnosis present

## 2024-02-06 DIAGNOSIS — B964 Proteus (mirabilis) (morganii) as the cause of diseases classified elsewhere: Secondary | ICD-10-CM | POA: Diagnosis present

## 2024-02-06 DIAGNOSIS — I38 Endocarditis, valve unspecified: Secondary | ICD-10-CM

## 2024-02-06 DIAGNOSIS — I509 Heart failure, unspecified: Secondary | ICD-10-CM

## 2024-02-06 DIAGNOSIS — I5022 Chronic systolic (congestive) heart failure: Secondary | ICD-10-CM | POA: Diagnosis present

## 2024-02-06 LAB — CBC
HCT: 38.2 % (ref 36.0–46.0)
Hemoglobin: 12.2 g/dL (ref 12.0–15.0)
MCH: 29.6 pg (ref 26.0–34.0)
MCHC: 31.9 g/dL (ref 30.0–36.0)
MCV: 92.7 fL (ref 80.0–100.0)
Platelets: 130 K/uL — ABNORMAL LOW (ref 150–400)
RBC: 4.12 MIL/uL (ref 3.87–5.11)
RDW: 15.4 % (ref 11.5–15.5)
WBC: 4.1 K/uL (ref 4.0–10.5)
nRBC: 0 % (ref 0.0–0.2)

## 2024-02-06 LAB — BASIC METABOLIC PANEL WITH GFR
Anion gap: 6 (ref 5–15)
BUN: 27 mg/dL — ABNORMAL HIGH (ref 6–20)
CO2: 32 mmol/L (ref 22–32)
Calcium: 9.4 mg/dL (ref 8.9–10.3)
Chloride: 96 mmol/L — ABNORMAL LOW (ref 98–111)
Creatinine, Ser: 1.13 mg/dL — ABNORMAL HIGH (ref 0.44–1.00)
GFR, Estimated: >60 mL/min
Glucose, Bld: 77 mg/dL (ref 70–99)
Potassium: 4.7 mmol/L (ref 3.5–5.1)
Sodium: 134 mmol/L — ABNORMAL LOW (ref 135–145)

## 2024-02-06 LAB — MRSA NEXT GEN BY PCR, NASAL: MRSA by PCR Next Gen: NOT DETECTED

## 2024-02-06 LAB — PROTIME-INR
INR: 1.8 — ABNORMAL HIGH (ref 0.8–1.2)
Prothrombin Time: 21.4 s — ABNORMAL HIGH (ref 11.4–15.2)

## 2024-02-06 LAB — HIV ANTIBODY (ROUTINE TESTING W REFLEX): HIV Screen 4th Generation wRfx: NONREACTIVE

## 2024-02-06 LAB — LACTATE DEHYDROGENASE: LDH: 213 U/L (ref 105–235)

## 2024-02-06 MED ORDER — ATORVASTATIN CALCIUM 10 MG PO TABS
20.0000 mg | ORAL_TABLET | Freq: Every day | ORAL | Status: DC
  Administered 2024-02-06 – 2024-02-22 (×17): 20 mg via ORAL
  Filled 2024-02-06 (×17): qty 2

## 2024-02-06 MED ORDER — ONDANSETRON HCL 4 MG/2ML IJ SOLN: 4.0000 mg | Freq: Four times a day (QID) | INTRAMUSCULAR | Status: DC | PRN

## 2024-02-06 MED ORDER — DIGOXIN 125 MCG PO TABS
0.1250 mg | ORAL_TABLET | Freq: Every day | ORAL | Status: DC
  Administered 2024-02-08 – 2024-02-23 (×16): 0.125 mg via ORAL
  Filled 2024-02-06 (×17): qty 1

## 2024-02-06 MED ORDER — TORSEMIDE 20 MG PO TABS
40.0000 mg | ORAL_TABLET | Freq: Every day | ORAL | Status: DC
  Administered 2024-02-07 – 2024-02-14 (×8): 40 mg via ORAL
  Filled 2024-02-06 (×8): qty 2

## 2024-02-06 MED ORDER — ALBUTEROL SULFATE (2.5 MG/3ML) 0.083% IN NEBU: 2.5000 mg | INHALATION_SOLUTION | RESPIRATORY_TRACT | Status: DC | PRN

## 2024-02-06 MED ORDER — LOSARTAN POTASSIUM 25 MG PO TABS
50.0000 mg | ORAL_TABLET | Freq: Every day | ORAL | Status: DC
  Administered 2024-02-07 – 2024-02-14 (×8): 50 mg via ORAL
  Filled 2024-02-06 (×8): qty 2

## 2024-02-06 MED ORDER — ACETAMINOPHEN 500 MG PO TABS: 500.0000 mg | ORAL_TABLET | Freq: Four times a day (QID) | ORAL | Status: DC | PRN

## 2024-02-06 MED ORDER — SPIRONOLACTONE 25 MG PO TABS
25.0000 mg | ORAL_TABLET | Freq: Every day | ORAL | Status: DC
  Administered 2024-02-07 – 2024-02-23 (×17): 25 mg via ORAL
  Filled 2024-02-06 (×17): qty 1

## 2024-02-06 MED ORDER — OXYCODONE-ACETAMINOPHEN 5-325 MG PO TABS
1.0000 | ORAL_TABLET | Freq: Three times a day (TID) | ORAL | Status: DC | PRN
  Administered 2024-02-06 – 2024-02-22 (×31): 1 via ORAL
  Filled 2024-02-06 (×33): qty 1

## 2024-02-06 MED ORDER — PANTOPRAZOLE SODIUM 40 MG PO TBEC
40.0000 mg | DELAYED_RELEASE_TABLET | Freq: Every day | ORAL | Status: DC
  Administered 2024-02-07 – 2024-02-23 (×17): 40 mg via ORAL
  Filled 2024-02-06 (×17): qty 1

## 2024-02-06 MED ORDER — SODIUM CHLORIDE 0.9 % IV SOLN
3.0000 g | Freq: Four times a day (QID) | INTRAVENOUS | Status: DC
  Administered 2024-02-06 – 2024-02-13 (×27): 3 g via INTRAVENOUS
  Filled 2024-02-06 (×29): qty 8

## 2024-02-06 MED ORDER — ACETAMINOPHEN 325 MG PO TABS
650.0000 mg | ORAL_TABLET | ORAL | Status: DC | PRN
  Administered 2024-02-06 – 2024-02-21 (×11): 650 mg via ORAL
  Filled 2024-02-06 (×11): qty 2

## 2024-02-06 MED ORDER — GABAPENTIN 300 MG PO CAPS
300.0000 mg | ORAL_CAPSULE | Freq: Two times a day (BID) | ORAL | Status: DC
  Administered 2024-02-06 – 2024-02-23 (×33): 300 mg via ORAL
  Filled 2024-02-06 (×33): qty 1

## 2024-02-06 MED ORDER — SODIUM CHLORIDE 0.9 % IV SOLN
3.0000 g | Freq: Once | INTRAVENOUS | Status: AC
  Administered 2024-02-06: 3 g via INTRAVENOUS
  Filled 2024-02-06: qty 8

## 2024-02-06 MED ORDER — TRAMADOL HCL 50 MG PO TABS
50.0000 mg | ORAL_TABLET | Freq: Four times a day (QID) | ORAL | Status: DC | PRN
  Administered 2024-02-06 – 2024-02-21 (×20): 100 mg via ORAL
  Filled 2024-02-06 (×20): qty 2
  Filled 2024-02-06: qty 1
  Filled 2024-02-06: qty 2

## 2024-02-06 NOTE — Progress Notes (Signed)
 Patient presents for dressing change in VAD Clinic today with her mom [name]. Reports no problems with VAD equipment or concerns with drive line.  Current drive line infection. Drive line cultures positive for Eikenella Corrodens 01/05/24. Pt has no showed multiple appointments.   Reports no missed doses of Augmentin .  Worsening drive line infection discussed with with Dr. Zenaida. Plan for direct admission to [facility] for CT scan and IV antibiotics. TCTS consulted for potential need for wound debridement.   Blood cultures obtained.  20G PIV placed in right hand. One dose of 3g of Unasyn  given in VAD Clinic.   ID made aware of pending admission.  Exit Site Care: Existing VAD dressing removed and site care performed using sterile technique. Drive line exit site cleaned with Chlora prep applicators x 2, allowed to dry, and gauze dressing with Silverlon patch applied to exit site. Velour exposed 1 inch at exit site. Site does not tunnel. Unable to express drainage. Abdomen is not tender to palpation. Small amount of thick, tan, foul smelling drainage present at exit site/on previous dressing. No tenderness tracking along drive line. No rash present. Drive line anchor applied.        Plan:  Admit to 2C Daily dressing changes by bedside nurse  [name] Lunger RN, BSN VAD Coordinator 24/7 Pager [phone]

## 2024-02-06 NOTE — Progress Notes (Signed)
 "  LVAD CLINIC NOTE  Primary Care: [facility], [place], [place] Primary Cardiologist: Dorn Ross, MD HF Cardiologist: Dr. Gardenia EP: Eulas Furbish, MD   HPI: [name] [name] is a 49 y.o. female with hx of saddle PE/DVT s/p IVC filter (2015), HFrEF/NICM, pancytopenia, nonobstructive CAD, morbid obesity, noncompliance, pancytopenia (followed by hematology), tobacco use.  Admitted with acute on chronic CHF in 06/24. EF was down to 25-30%.   Had been considered for mTEER and possible LVAD but was lost to follow-up until recent readmission.  Admitted to [facility] ICU in 04/25 with cardiogenic shock. Transferred to [facility] with worsening hypotension and increased pressor requirements.  Echo 4/25 EF 20-25% severe RV dysfunction with septal flattening mod-severe MR. She was discharged with home inotrope with plan to workup for advanced therapies as an ouptatient if compliance improved. Discharge weight 244 lbs.  Underwent mTEER, complicated by torrential MR and damaged leaflet. Required IABP --> Impella 5.5 Underwent HM3 implant on 07/24/23 along with mitral valve replacement.  Post surgery was admitted with a dental abscess, required debridement and course of antibiotics.  Interval hx:   Missed a few appointments in the last weeks, reports that she had a significant upper respiratory infection but that this has since improved. Continues to deny any significant shortness of breat, chest pain, swelling. She continues to deny any abdominal pain or distension. Still having some drainage from her driveline site, she has a paper towel helping to dress today as opposed to a maxi pad. Discussed the importance of dressing hygiene and regular dressing changes.  Also has multiple control o'clock alarms on her device.  Suspect that she has been letting her battery drain which was talked about at length at last visit. Log files sent.     Current Outpatient Medications  Medication Sig Dispense Refill    acetaminophen  (TYLENOL ) 500 MG tablet Take 1 tablet (500 mg total) by mouth every 6 (six) hours as needed. 30 tablet 0   atorvastatin  (LIPITOR) 20 MG tablet Take 1 tablet (20 mg total) by mouth daily. 90 tablet 3   digoxin  (LANOXIN ) 0.125 MG tablet Take 1 tablet (0.125 mg total) by mouth daily. 90 tablet 4   Fe Fum-Vit C-Vit B12-FA (TRIGELS-F FORTE) CAPS capsule Take 1 capsule by mouth daily after breakfast. 90 capsule 3   gabapentin  (NEURONTIN ) 300 MG capsule Take 1 capsule (300 mg total) by mouth 2 (two) times daily. 60 capsule 1   losartan  (COZAAR ) 25 MG tablet Take 2 tablets (50 mg total) by mouth daily. 90 tablet 3   pantoprazole  (PROTONIX ) 40 MG tablet Take 1 tablet (40 mg total) by mouth daily. 90 tablet 4   potassium chloride  SA (KLOR-CON  M) 20 MEQ tablet Take 1 tablet (20 mEq total) by mouth every other day. With Torsemide  30 tablet 3   spironolactone  (ALDACTONE ) 25 MG tablet Take 1 tablet (25 mg total) by mouth daily. 180 tablet 4   torsemide  (DEMADEX ) 20 MG tablet Take 2 tablets (40 mg total) by mouth daily. (STOP furosemide ) 60 tablet 6   warfarin (COUMADIN ) 5 MG tablet Take 1 tablet (5 mg total) by mouth daily at 4 PM. Take 7.5 mg (1.5 tablets) every Tue, Sat; 5 mg (1 tablet) all other days; or as directed by the heart failure clinic 60 tablet 5   albuterol  (VENTOLIN  HFA) 108 (90 Base) MCG/ACT inhaler Inhale 2 puffs into the lungs every 2 (two) hours as needed for wheezing or shortness of breath. 18 g 11   amoxicillin -clavulanate (AUGMENTIN )  875-125 MG tablet Take 1 tablet by mouth 2 (two) times daily. 60 tablet 0   senna-docusate (SENOKOT-S) 8.6-50 MG tablet Take 1 tablet by mouth daily. (Patient not taking: Reported on 02/04/2024) 90 tablet 4   traMADol  (ULTRAM ) 50 MG tablet Take 50-100 mg by mouth every 6 (six) hours as needed. (Patient not taking: Reported on 02/04/2024)     No current facility-administered medications for this encounter.     PHYSICAL EXAM: Vitals:   02/04/24  1010 02/04/24 1015  BP: 101/67 (!) 92/0  Pulse: 75   SpO2: 98%      Physical Exam: General:  Well appearing. No resp difficulty Cor: Mechanical heart sounds with LVAD hum present. JVP flat, trace edema Lungs: Normal WOB Abdomen: some fullness just distal to her driveline site entrance. Not tender, potentially scar tissue related Driveline: poorly dressed, tan drainage Neuro: alert & orientedx3, cranial nerves grossly intact. moves all 4 extremities w/o difficulty.     LVAD Documentation    02/04/2024  Device Info  LVAD Type: Heartmate III  Date of Implant: 07/24/2023  Therapy Type: Destination Therapy      02/04/2024  Vitals  Heart Rate: 75 BPM  Automatic BP: 101/67  Doppler MAP: 92 mmHg  SpO2: 98 %    Last 3 Weights Weight Weight  02/04/2024 115.486 kg 254 lb 9.6 oz  01/05/2024 113.399 kg 250 lb  12/16/2023 108.183 kg 238 lb 8 oz       02/04/2024  LVAD Paramaters  Speed: 6000 RPM  Flow: 4 LPM  PI: 3  Power: 5 Watts  Alarms: controller clock not set-- due to pump off-- log files sent to [employer]  Events: rare  Last Speed Change Date: 07/30/2023  Last Ramp Echo Date: 07/30/2023  Last Right Heart Cath Date: 06/02/2023  Bleeding History: No  Type of Dressing: Daily  Annual Maintenance Date: 08/05/2023    Labs    Units 02/04/24 1104 01/09/24 1031 01/05/24 1026 12/16/23 1042 09/19/23 1035 09/12/23 1245  INR  1.9* 2.2* 1.4*  --    < > 2.0*  LDH U/L 227  --  176  --   --  244*  HGB g/dL 86.4  --  87.4 86.5  --  12.5  CREATININE mg/dL 8.59*  --  8.78* 8.69*  --  0.99   < > = values in this interval not displayed.        Wt Readings from Last 3 Encounters:  02/04/24 115.5 kg (254 lb 9.6 oz)  01/05/24 113.4 kg (250 lb)  12/16/23 108.2 kg (238 lb 8 oz)    DATA REVIEW   ECHO: 09/24/21: LVEF 30-35%, RV function mildly reduced, severe posterior MR 8/23: LVEF 30-35%, LVID 7.2cm w/ grade III DD, moderate to severe MR. Severely dilated LA.  09/03/17: LVEF 60%-65%.   TEE 2/24: severe eccentric MR, moderately reduced LV function.  07/03/22: LVEF 25-30% 05/07/23: LVEF 25-30%, grade III DD, RV moderately reduced, moderate posterior MR 05/16/23: LVEF 25-30%, RV moderately reduced, moderate to severe MR   CATH: 06/19/21: Near normal coronary arteries Capillary wedge mean 24 mmHg.  V wave to 40 mmHg Cardiac output 8.5 L/min with index 3.57 L/min/m. Pulmonary resistance 0.7 Wood units.  RHC on 7.5 DBA 05/13/23: RA 19, PA 90/38, PCWP 27, TD CO/CI 6.4/2.66  ASSESSMENT & PLAN: Chronic systolic HF with biventricular dysfunction/stage D cardiomyopathy s/p HM3 07/24/2023 with concurrent MVR - Tolerated well, long inotrope wean for RV dysfunction but off within 2 weeks - Stable NYHA  class II - Reports medication compliance - Continue losartan  50 mg daily, consider transition to Entresto in the future -Continue torsemide  40mg  daily -Continue digoxin  0.125 mg daily, last dig level <0.6 -Continue spironolactone  25mg  daily -INR goal 2-3, 1.9  - LDH 227 - Continue cefadroxil  1000 mg twice daily for driveline infection, close follow up  Severe MR s/p MVR - Echo 4/25 with severe MR  - TEER on 07/09/23 complicated by worsening MR - S/p MVR   Hx of PE/DVT w/ IVC filter - Evaluated at [facility] where thrombophilia work-up was negative (Lupus inhibitor, anticardiolipin antibodies, and anti beta-2  glycoprotein antibodies were negative; protein C activity 85%, protein S activity 76%, factor V Leiden, and prothrombin gene mutation ) - IVC filter in place; could not be removed due to significant thrombus burden on filter at [facility].  - Continue Warfarin as above   Obesity  - Body mass index is 36.53 kg/m.  - BMI significantly improved, will need GLP-1 later if considering txp, weight is up - Discuss at next visit   CAD - LHC in 5/23 w/ nonobstructive CAD involving RCA - Inferior WMA on most recent echo - stable; no chest pain.     Microcytic anemia - Mild iron  deficiency  noted on recent labs  Tobacco use - discussed importance of cessation with regards to transplant in the future.   I spent 43 minutes caring for this patient today including face to face time, ordering and reviewing labs, reviewing log files, interrogating LVAD, seeing the patient, documenting in the record, and arranging follow ups.  I reviewed the LVAD parameters from today, and compared the results to the patient's prior recorded data.  No programming changes were made.  The LVAD is functioning within specified parameters.  The patient performs LVAD self-test daily.  LVAD interrogation was negative for any significant power changes, alarms or PI events/speed drops.  LVAD equipment check completed and is in good working order.  Back-up equipment present.   LVAD education done on emergency procedures and precautions and reviewed exit site care.    Morene Brownie, MD 9:08 AM   "

## 2024-02-06 NOTE — Progress Notes (Signed)
 PHARMACY - ANTICOAGULATION CONSULT NOTE  Pharmacy Consult for warfarin Indication: LVAD HM3 with hx PE/DVT  Allergies[1]  Patient Measurements: Height: 5' 10 (177.8 cm) Weight: 114.4 kg (252 lb 3.3 oz) IBW/kg (Calculated) : 68.5 HEPARIN  DW (KG): 94.3  Vital Signs: Temp: 98.8 F (37.1 C) (01/16 1551) Temp Source: Oral (01/16 1551) BP: 106/78 (01/16 1551)  Labs: Recent Labs    02/04/24 1104  HGB 13.5  HCT 41.5  PLT 150  LABPROT 22.6*  INR 1.9*  CREATININE 1.40*    Estimated Creatinine Clearance: 68.1 mL/min (A) (by C-G formula based on SCr of 1.4 mg/dL (H)).   Medical History: Past Medical History:  Diagnosis Date   Anemia    Blood transfusion without reported diagnosis    CAD (coronary artery disease)    Nonobstructive   Closed left ankle fracture    August 30 2012   Clotting disorder    DVT (deep venous thrombosis) (HCC)    L leg   Fibroid tumor    HFrEF (heart failure with reduced ejection fraction) (HCC)    Hidradenitis    Hypertension    LVAD (left ventricular assist device) present [facility])    Mitral regurgitation    Myocardial infarction (HCC)    NICM (nonischemic cardiomyopathy) (HCC)    NSVT (nonsustained ventricular tachycardia) (HCC)    Obesity    OSA on CPAP    Pulmonary embolism (HCC) 04/2013   Sleep apnea     Medications:  Scheduled:   atorvastatin   20 mg Oral q1800   [START ON 02/07/2024] digoxin   0.125 mg Oral Daily   gabapentin   300 mg Oral BID   [START ON 02/07/2024] losartan   50 mg Oral Daily   [START ON 02/07/2024] pantoprazole   40 mg Oral Daily   [START ON 02/07/2024] spironolactone   25 mg Oral Daily   [START ON 02/07/2024] torsemide   40 mg Oral Daily    Assessment: 86 yof presenting with concern for driveline infection - plan to admit for IV antibiotics and evaluation for possible I&D. On warfarin PTA for LVAD + hx DVT/PE.   PTA warfarin regimen is  5 mg daily except 7.5 mg Tues/Sat at last clinic appointment.  Discussed with  MD - plan to hold warfarin in case need for I&D.  Goal of Therapy:  INR 2-3 Heparin  level <0.3 - fixed rate unless directed by MD Monitor platelets by anticoagulation protocol: Yes   Plan:  Hold warfarin  Start heparin  infusion at 500 units/hr fixed rate, no titration if INR<1.5 Monitor daily INR, CBC, and for s/sx of bleeding  Thank you for allowing pharmacy to participate in this patient's care,  Suzen Sour, PharmD, BCCCP Clinical Pharmacist  Phone: [phone] 02/06/2024 4:28 PM  Please check AMION for all [facility] Pharmacy phone numbers After 10:00 PM, call Main Pharmacy [phone]      [1]  Allergies Allergen Reactions   Sulfa  Antibiotics Itching, Swelling and Rash    Lip swelling

## 2024-02-06 NOTE — H&P (Addendum)
 "   Advanced Heart Failure Team History and Physical Note   PCP:  Grooms, Charmaine, PA-C  PCP-Cardiology: None     Reason for Admission:    HPI:   [name] [name] is a 49 y.o. female with hx of saddle PE/DVT s/p IVC filter (2015), HFrEF/NICM, pancytopenia, nonobstructive CAD, morbid obesity, noncompliance, pancytopenia (followed by hematology), tobacco use.   Admitted to [facility] ICU in 04/25 with cardiogenic shock. Transferred to [facility] with worsening hypotension and increased pressor requirements.  Echo 4/25 EF 20-25% severe RV dysfunction with septal flattening mod-severe MR. She was discharged with home inotrope with plan to workup for advanced therapies as an ouptatient if compliance improved. Discharge weight 244 lbs.   Underwent mTEER, complicated by torrential MR and damaged leaflet. Required IABP --> Impella 5.5 Underwent HM3 implant on 07/24/23 along with mitral valve replacement.  Had initially done very well from a cardiac standpoint, however had missed multiple appointments in a row.  At her last visit about a month ago her driveline dressing had extensive drainage, was dressed with a maxi pad, and she had multiple controller clock alarms on her pump, likely the setting of letting her batteries run down significantly.  She was placed on oral antibiotics given otherwise nontoxic appearing.  She missed multiple visits for driveline dressing care and showed back earlier this week with worsening induration around the driveline site and continued drainage.  CT scan was ordered and she should back up for dressing changes.  However given her minimal improvement on oral antibiotics, concerning abdominal signs we discussed inpatient admission for IV antibiotics and potential debridement.  She has been reluctant in the past but was willing to come in at this time.      Home Medications Prior to Admission medications  Medication Sig Start Date End Date Taking? Authorizing Provider  acetaminophen   (TYLENOL ) 500 MG tablet Take 1 tablet (500 mg total) by mouth every 6 (six) hours as needed. 08/08/23   [name], [name], DO  albuterol  (VENTOLIN  HFA) 108 (90 Base) MCG/ACT inhaler Inhale 2 puffs into the lungs every 2 (two) hours as needed for wheezing or shortness of breath. 02/04/24   Zenaida [name] PARAS, MD  amoxicillin -clavulanate (AUGMENTIN ) 875-125 MG tablet Take 1 tablet by mouth 2 (two) times daily. 02/04/24 03/05/24  Zenaida [name] PARAS, MD  atorvastatin  (LIPITOR) 20 MG tablet Take 1 tablet (20 mg total) by mouth daily. 09/17/23   Grooms, [name], PA-C  digoxin  (LANOXIN ) 0.125 MG tablet Take 1 tablet (0.125 mg total) by mouth daily. 01/07/24   Zenaida [name] PARAS, MD  Fe Fum-Vit C-Vit B12-FA (TRIGELS-F FORTE) CAPS capsule Take 1 capsule by mouth daily after breakfast. 01/09/24   Zenaida [name] PARAS, MD  gabapentin  (NEURONTIN ) 300 MG capsule Take 1 capsule (300 mg total) by mouth 2 (two) times daily. 12/16/23   Grooms, [name], PA-C  losartan  (COZAAR ) 25 MG tablet Take 2 tablets (50 mg total) by mouth daily. 08/18/23   Zenaida [name] PARAS, MD  pantoprazole  (PROTONIX ) 40 MG tablet Take 1 tablet (40 mg total) by mouth daily. 01/05/24   Zenaida [name] PARAS, MD  potassium chloride  SA (KLOR-CON  M) 20 MEQ tablet Take 1 tablet (20 mEq total) by mouth every other day. With Torsemide  12/16/23   Grooms, [name], PA-C  senna-docusate (SENOKOT-S) 8.6-50 MG tablet Take 1 tablet by mouth daily. Patient not taking: Reported on 02/04/2024 08/06/23   [name], Jordan, NP  spironolactone  (ALDACTONE ) 25 MG tablet Take 1 tablet (25 mg total) by mouth daily.  08/05/23   [name], Jordan, NP  torsemide  (DEMADEX ) 20 MG tablet Take 2 tablets (40 mg total) by mouth daily. (STOP furosemide ) 01/28/24   Alvan Dorn FALCON, MD  traMADol  (ULTRAM ) 50 MG tablet Take 50-100 mg by mouth every 6 (six) hours as needed. Patient not taking: Reported on 02/04/2024 08/26/23   [provider]  warfarin (COUMADIN ) 5 MG tablet Take 1 tablet (5 mg  total) by mouth daily at 4 PM. Take 7.5 mg (1.5 tablets) every Tue, Sat; 5 mg (1 tablet) all other days; or as directed by the heart failure clinic 01/09/24   Zenaida [name] PARAS, MD     Objective:    Vital Signs:   BP: ()/()  Arterial Line BP: ()/()    There were no vitals filed for this visit.  LVAD Documentation    02/04/2024  Device Info  LVAD Type: Heartmate III  Date of Implant: 07/24/2023  Therapy Type: Destination Therapy      02/04/2024  Vitals  Heart Rate: 75 BPM  Automatic BP: 101/67  Doppler MAP: 92 mmHg  SpO2: 98 %    Last 3 Weights Weight Weight  02/04/2024 115.486 kg 254 lb 9.6 oz  01/05/2024 113.399 kg 250 lb  12/16/2023 108.183 kg 238 lb 8 oz       02/04/2024  LVAD Paramaters  Speed: 6000 RPM  Flow: 4 LPM  PI: 3  Power: 5 Watts  Alarms: controller clock not set-- due to pump off-- log files sent to [employer]  Events: rare  Last Speed Change Date: 07/30/2023  Last Ramp Echo Date: 07/30/2023  Last Right Heart Cath Date: 06/02/2023  Bleeding History: No  Type of Dressing: Daily  Annual Maintenance Date: 08/05/2023    Labs    Units 02/04/24 1104 01/09/24 1031 01/05/24 1026 12/16/23 1042 09/19/23 1035 09/12/23 1245  INR  1.9* 2.2* 1.4*  --    < > 2.0*  LDH U/L 227  --  176  --   --  244*  HGB g/dL 86.4  --  87.4 86.5  --  12.5  CREATININE mg/dL 8.59*  --  8.78* 8.69*  --  0.99   < > = values in this interval not displayed.         Physical Exam    General:  Well appearing. No resp difficulty Cor: Mechanical heart sounds with LVAD hum present. JVP flat, No edema Lungs: Normal WOB Driveline: Tan drainage, hard induration just distal to insertion part, driveline not well incorporated Neuro: alert & orientedx3, cranial nerves grossly intact. moves all 4 extremities w/o difficulty.      Patient Profile   [name] [name] is a 49 y.o. female with hx of saddle PE/DVT s/p IVC filter (2015), HFrEF/NICM, pancytopenia, nonobstructive CAD, morbid  obesity, noncompliance, pancytopenia (followed by hematology), tobacco use who presents for driveline infection.  Assessment/Plan   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  Related to poor outpatient dressing care.  Plan for CT scan with hopeful culture. - Direct admission for IV antibiotics, scan, and potential CT surgery consult - CT abdomen pelvis with contrast - Can start IV Unasyn  for coverage at the moment - Culture drainage as able - CT surgery consult pending imaging - ID for long-term antibiotic recommendations - Emphasized the need for better driveline care and follow-up adherence outside the hospital  Chronic systolic HF with biventricular dysfunction/stage D cardiomyopathy s/p HM3 07/24/2023 with concurrent MVR - Continue speed at 6000 RPM, stable -  Stable NYHA class II - Continue losartan  50 mg daily, consider transition to [facility] inpatient, potentially after OR if needed -Continue torsemide  40mg  daily -Continue digoxin  0.125 mg daily, last dig level <0.6 -Continue spironolactone  25mg  daily - Hold warfarin given potential need for OR   Severe MR s/p MVR - Echo 4/25 with severe MR  - TEER on 07/09/23 complicated by worsening MR - S/p MVR   Hx of PE/DVT w/ IVC filter - Evaluated at [facility] where thrombophilia work-up was negative (Lupus inhibitor, anticardiolipin antibodies, and anti beta-2  glycoprotein antibodies were negative; protein C activity 85%, protein S activity 76%, factor V Leiden, and prothrombin gene mutation ) - IVC filter in place; could not be removed due to significant thrombus burden on filter at [facility].  - Holding warfarin as above   Obesity  - BMI significantly improved, will need GLP-1 later if considering txp   CAD - LHC in 5/23 w/ nonobstructive CAD involving RCA - Inferior WMA on most recent echo - stable; no chest pain.     Microcytic anemia - Mild iron  deficiency noted on recent labs   Tobacco  use - discussed importance of cessation with regards to transplant in the future.      [name] JINNY Brownie, MD 02/06/2024, 1:33 PM  Advanced Heart Failure Team Pager [phone] (M-F; 7a - 5p)   Please visit [email or web address]: For overnight coverage please call cardiology fellow first. If fellow not available call Shock/ECMO MD on call.  For ECMO / Mechanical Support (Impella, IABP, LVAD) issues call Shock / ECMO MD on call.    "

## 2024-02-07 ENCOUNTER — Other Ambulatory Visit: Payer: Self-pay

## 2024-02-07 ENCOUNTER — Encounter (HOSPITAL_COMMUNITY): Payer: Self-pay | Admitting: Cardiology

## 2024-02-07 DIAGNOSIS — T827XXA Infection and inflammatory reaction due to other cardiac and vascular devices, implants and grafts, initial encounter: Secondary | ICD-10-CM | POA: Diagnosis not present

## 2024-02-07 DIAGNOSIS — B9689 Other specified bacterial agents as the cause of diseases classified elsewhere: Secondary | ICD-10-CM

## 2024-02-07 DIAGNOSIS — I509 Heart failure, unspecified: Secondary | ICD-10-CM | POA: Diagnosis not present

## 2024-02-07 DIAGNOSIS — Z91199 Patient's noncompliance with other medical treatment and regimen due to unspecified reason: Secondary | ICD-10-CM

## 2024-02-07 DIAGNOSIS — I38 Endocarditis, valve unspecified: Secondary | ICD-10-CM | POA: Diagnosis not present

## 2024-02-07 LAB — BASIC METABOLIC PANEL WITH GFR
Anion gap: 7 (ref 5–15)
BUN: 28 mg/dL — ABNORMAL HIGH (ref 6–20)
CO2: 29 mmol/L (ref 22–32)
Calcium: 9 mg/dL (ref 8.9–10.3)
Chloride: 97 mmol/L — ABNORMAL LOW (ref 98–111)
Creatinine, Ser: 1.17 mg/dL — ABNORMAL HIGH (ref 0.44–1.00)
GFR, Estimated: 58 mL/min — ABNORMAL LOW
Glucose, Bld: 82 mg/dL (ref 70–99)
Potassium: 4.5 mmol/L (ref 3.5–5.1)
Sodium: 133 mmol/L — ABNORMAL LOW (ref 135–145)

## 2024-02-07 LAB — CBC
HCT: 34.3 % — ABNORMAL LOW (ref 36.0–46.0)
Hemoglobin: 11.4 g/dL — ABNORMAL LOW (ref 12.0–15.0)
MCH: 30.2 pg (ref 26.0–34.0)
MCHC: 33.2 g/dL (ref 30.0–36.0)
MCV: 91 fL (ref 80.0–100.0)
Platelets: 111 K/uL — ABNORMAL LOW (ref 150–400)
RBC: 3.77 MIL/uL — ABNORMAL LOW (ref 3.87–5.11)
RDW: 15.5 % (ref 11.5–15.5)
WBC: 3.8 K/uL — ABNORMAL LOW (ref 4.0–10.5)
nRBC: 0 % (ref 0.0–0.2)

## 2024-02-07 LAB — PROTIME-INR
INR: 1.8 — ABNORMAL HIGH (ref 0.8–1.2)
Prothrombin Time: 22.1 s — ABNORMAL HIGH (ref 11.4–15.2)

## 2024-02-07 LAB — HIV ANTIBODY (ROUTINE TESTING W REFLEX): HIV Screen 4th Generation wRfx: NONREACTIVE

## 2024-02-07 LAB — LACTATE DEHYDROGENASE: LDH: 186 U/L (ref 105–235)

## 2024-02-07 MED ORDER — HEPARIN (PORCINE) 25000 UT/250ML-% IV SOLN
500.0000 [IU]/h | INTRAVENOUS | Status: DC
  Administered 2024-02-07 – 2024-02-09 (×2): 500 [IU]/h via INTRAVENOUS
  Filled 2024-02-07 (×2): qty 250

## 2024-02-07 NOTE — Consult Note (Signed)
 "    [facility]    Date of Admission:  02/06/2024     Total days of antibiotics                Reason for Consult: LVAD drive line infection   Referring Provider: Dr. Cherrie Primary Care Provider: Grooms, Charmaine, [place]   ASSESSMENT:  Ms. Weidemann is a 49 y/o female s/p LVAD HM3 placement and mitral valve repair presenting from LVAD clinic with concern for driveline infection and previous cultures growing Eikinella and outpatient treatment with Augmentin  now on ampicillin -sulbactam awaiting further evaluation by CVTS for potential surgical debridement of her drive line. Blood cultures reviewed with no growth and MRSA PCR is negative. Discussed plan of care to continue with current dose of ampicillin -sulbactam while awaiting need for surgical intervention. Emphasized importance of routine follow up as missing appointments places her at significant risk for increased infections. Standard/universal precautions. LVAD and remaining medical and supportive care per primary team.   PLAN:  Continue current dose of ampicillin -sulbactam. Await CVTS evaluation for possible driveline debridement.  Monitor blood cultures for bacteremia.  Standard/universal precautions.  LVAD and remaining medical and supportive care per primary team.    Principal Problem:   Infection associated with driveline of left ventricular assist device (LVAD)    atorvastatin   20 mg Oral q1800   digoxin   0.125 mg Oral Daily   gabapentin   300 mg Oral BID   losartan   50 mg Oral Daily   pantoprazole   40 mg Oral Daily   spironolactone   25 mg Oral Daily   torsemide   40 mg Oral Daily     HPI: LEGACY CARRENDER is a 49 y.o. female with previous medical history of pulmonary emboli s/p IVC filter, tobacco use, obesity, and s/p mTEER complicated by torrential mitral regurgitation and damage leaflet s/p LVAD HM3 placement in July 2025 with mitral valve replacement admitted to the hospital from the LVAD  clinic with concern for drive line infection.  Ms. Vizcarrondo was seen in the LVAD clinic on 01/08/24 and noted to have a tunneling driveline infection with cultures showing Eikenella corrodens. This was following two episodes of getting her driveline caught up on things. Initially started on Cefadroxil  and was changed to Augmentin  for 4 week course. Missed a few appoints prior to being seen on 02/04/24 with continued drainage from her driveline which was dressed with a paper towel.  She was continued on Augmentin . Seen for follow up on 02/06/24 with worsening driveline infection and decision to admit for CT evaluation and CVTS consult.   Ms. Zakrzewski was afebrile on admission with WBC count of 3,200. CT imaging performed and awaiting CVTS evaluation for surgical intervention. Subsequent finding of right breast mass with skin thickening concerning for infection/abscess or malignancy with recommendation for mammogram. Current antimicrobial therapy with ampicillin /sulbactam. Continues to have drive line pain since it got hung up with continued drainage.     Review of Systems: Review of Systems  Constitutional:  Negative for chills, fever and weight loss.  Respiratory:  Negative for cough, shortness of breath and wheezing.   Cardiovascular:  Negative for chest pain and leg swelling.  Gastrointestinal:  Negative for abdominal pain, constipation, diarrhea, nausea and vomiting.  Skin:  Negative for rash.     Past Medical History:  Diagnosis Date   Anemia    Blood transfusion without reported diagnosis    CAD (coronary artery disease)    Nonobstructive   Closed left ankle fracture  August 30 2012   Clotting disorder    DVT (deep venous thrombosis) (HCC)    L leg   Fibroid tumor    HFrEF (heart failure with reduced ejection fraction) (HCC)    Hidradenitis    Hypertension    LVAD (left ventricular assist device) present [facility])    Mitral regurgitation    Myocardial infarction (HCC)    NICM  (nonischemic cardiomyopathy) (HCC)    NSVT (nonsustained ventricular tachycardia) (HCC)    Obesity    OSA on CPAP    Pulmonary embolism (HCC) 04/2013   Sleep apnea     Social History[1]  Family History  Problem Relation Age of Onset   Hypertension Mother    Diabetes Paternal Aunt    Diabetes Paternal Uncle    Other Father        blood clots   Cancer Maternal Grandmother    Heart attack Paternal Grandmother    Colon cancer Neg Hx     Allergies[2]  OBJECTIVE: Blood pressure 126/86, pulse 66, temperature 97.8 F (36.6 C), temperature source Oral, resp. rate 20, height 5' 10 (1.778 m), weight 115.9 kg, SpO2 98%.  Physical Exam Constitutional:      General: She is not in acute distress.    Appearance: She is well-developed.  Cardiovascular:     Rate and Rhythm: Normal rate and regular rhythm.     Heart sounds: Normal heart sounds.     Comments: LVAD humm Pulmonary:     Effort: Pulmonary effort is normal.     Breath sounds: Normal breath sounds.  Skin:    General: Skin is warm and dry.  Neurological:     Mental Status: She is alert.     Lab Results Lab Results  Component Value Date   WBC 3.8 (L) 02/07/2024   HGB 11.4 (L) 02/07/2024   HCT 34.3 (L) 02/07/2024   MCV 91.0 02/07/2024   PLT 111 (L) 02/07/2024    Lab Results  Component Value Date   CREATININE 1.17 (H) 02/07/2024   BUN 28 (H) 02/07/2024   NA 133 (L) 02/07/2024   K 4.5 02/07/2024   CL 97 (L) 02/07/2024   CO2 29 02/07/2024    Lab Results  Component Value Date   ALT 12 01/05/2024   AST 24 01/05/2024   ALKPHOS 83 01/05/2024   BILITOT 0.8 01/05/2024     Microbiology: Recent Results (from the past 240 hours)  Culture, blood (Routine X 2) w Reflex to ID Panel     Status: None (Preliminary result)   Collection Time: 02/06/24 11:48 AM   Specimen: BLOOD  Result Value Ref Range Status   Specimen Description BLOOD BLOOD RIGHT HAND  Final   Special Requests   Final    BOTTLES DRAWN AEROBIC AND  ANAEROBIC Blood Culture adequate volume   Culture   Final    NO GROWTH < 24 HOURS Performed at [facility], [address] [place], [place] [address]    Report Status PENDING  Incomplete  Culture, blood (Routine X 2) w Reflex to ID Panel     Status: None (Preliminary result)   Collection Time: 02/06/24 12:00 PM   Specimen: BLOOD  Result Value Ref Range Status   Specimen Description BLOOD LEFT ANTECUBITAL  Final   Special Requests   Final    BOTTLES DRAWN AEROBIC AND ANAEROBIC Blood Culture adequate volume   Culture   Final    NO GROWTH < 24 HOURS Performed at [facility], [address], [place], [place] [address]    Report Status PENDING  Incomplete  MRSA Next Gen by PCR, Nasal     Status: None   Collection Time: 02/06/24  4:07 PM   Specimen: Nasal Mucosa; Nasal Swab  Result Value Ref Range Status   MRSA by PCR Next Gen NOT DETECTED NOT DETECTED Final    Comment: (NOTE) The GeneXpert MRSA Assay (FDA approved for NASAL specimens only), is one component of a comprehensive MRSA colonization surveillance program. It is not intended to diagnose MRSA infection nor to guide or monitor treatment for MRSA infections. Test performance is not FDA approved in patients less than 58 years old. Performed at [facility], [address], [place], [place] [address]     I personally spent a total of 30 minutes in the care of the patient today including preparing to see the patient, getting/reviewing separately obtained history, performing a medically appropriate exam/evaluation, counseling and educating, documenting clinical information in the EHR, independently interpreting results, communicating results, and coordinating care.  Cathlyn July, NP [facility]  02/07/2024  12:34 PM     [1]  Social History Tobacco Use   Smoking status: Every Day    Types: Cigarettes    Passive exposure: Never   Smokeless tobacco:  Never   Tobacco comments:    Smokes 2-3 cigarettes daily as of 02/13/22  Vaping Use   Vaping status: Never Used  Substance Use Topics   Alcohol  use: Yes    Alcohol /week: 2.0 standard drinks of alcohol     Types: 2 Cans of beer per week    Comment: twice a week   Drug use: No  [2]  Allergies Allergen Reactions   Sulfa  Antibiotics Itching, Swelling and Rash    Lip swelling   "

## 2024-02-07 NOTE — Progress Notes (Signed)
 "  Advanced Heart Failure VAD Team Note  PCP-Cardiologist: None  Chief Complaint: LVAD infection  Patient Profile   [name] [name] is a 49 y.o. female with LVAD DL infection  Significant events:    Admitted 1/16  Subjective:    On unasyn . No fevers or chills.   LVAD INTERROGATION:  HeartMate 3 LVAD:   Flow 4.2 liters/min, speed 6000, power 4.8, PI 2.9.    Objective:   Vital Signs:   Temp:  [97.6 F (36.4 C)-98.8 F (37.1 C)] 97.6 F (36.4 C) (01/17 1501) Pulse Rate:  [57-66] 60 (01/17 1501) Resp:  [15-20] 15 (01/17 1501) BP: (99-137)/(73-105) 99/79 (01/17 1501) SpO2:  [95 %-99 %] 97 % (01/17 1501) Weight:  [115.9 kg] 115.9 kg (01/17 0644) Last BM Date : 02/06/24 Mean arterial Pressure 80s  Intake/Output:   Intake/Output Summary (Last 24 hours) at 02/07/2024 1512 Last data filed at 02/07/2024 1446 Gross per 24 hour  Intake 580 ml  Output 3400 ml  Net -2820 ml    Physical Exam   General:  NAD.  HEENT: normal  Neck: supple. JVP not elevated.  Carotids 2+ bilat; no bruits. No lymphadenopathy or thryomegaly appreciated. Cor: LVAD hum.  Lungs: Clear. Abdomen: obese soft, nontender, non-distended. No hepatosplenomegaly. No bruits or masses. Good bowel sounds. Driveline site clean. Anchor in place.  Extremities: no cyanosis, clubbing, rash. Warm no edema  Neuro: alert & oriented x 3. No focal deficits. Moves all 4 without problem   Telemetry   Sinus 60s Personally reviewed  Labs  Basic Metabolic Panel: Recent Labs  Lab 02/04/24 1104 02/06/24 1857 02/07/24 0148  NA 136 134* 133*  K 4.9 4.7 4.5  CL 96* 96* 97*  CO2 33* 32 29  GLUCOSE 84 77 82  BUN 36* 27* 28*  CREATININE 1.40* 1.13* 1.17*  CALCIUM  9.4 9.4 9.0    Liver Function Tests: No results for input(s): AST, ALT, ALKPHOS, BILITOT, PROT, ALBUMIN  in the last 168 hours. No results for input(s): LIPASE, AMYLASE in the last 168 hours. No results for input(s): AMMONIA in the last  168 hours.  CBC: Recent Labs  Lab 02/04/24 1104 02/06/24 1857 02/07/24 0148  WBC 3.2* 4.1 3.8*  HGB 13.5 12.2 11.4*  HCT 41.5 38.2 34.3*  MCV 92.0 92.7 91.0  PLT 150 130* 111*    INR: Recent Labs  Lab 02/04/24 1104 02/06/24 1857 02/07/24 0148  INR 1.9* 1.8* 1.8*    Medications:    Scheduled Medications:  atorvastatin   20 mg Oral q1800   digoxin   0.125 mg Oral Daily   gabapentin   300 mg Oral BID   losartan   50 mg Oral Daily   pantoprazole   40 mg Oral Daily   spironolactone   25 mg Oral Daily   torsemide   40 mg Oral Daily    Infusions:  ampicillin -sulbactam (UNASYN ) IV 3 g (02/07/24 1444)    PRN Medications: acetaminophen , albuterol , ondansetron  (ZOFRAN ) IV, oxyCODONE -acetaminophen , traMADol   Assessment/Plan:    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  Related to poor outpatient dressing care.  Plan for CT scan with hopeful culture. - Direct admission for IV antibiotics, scan, and potential CT surgery consult - CT abdomen pelvis with contrast - On IV Unasyn   - Plan for I&D on Tuesday  Chronic systolic HF with biventricular dysfunction/stage D cardiomyopathy s/p HM3 07/24/2023 with concurrent MVR - Continue speed at 6000 RPM, stable - Stable NYHA class II - Continue losartan  50 mg daily, consider transition  to [facility] inpatient, potentially after OR if needed -Continue torsemide  40mg  daily -Continue digoxin  0.125 mg daily, last dig level <0.6 -Continue spironolactone  25mg  daily - Hold warfarin given potential need for OR - INR 1.8 - Start heparin  when INR <= 2.0 given h/o DVT   Severe MR s/p MVR - Echo 4/25 with severe MR  - TEER on 07/09/23 complicated by worsening MR - S/p MVR   Hx of PE/DVT w/ IVC filter - Evaluated at [facility] where thrombophilia work-up was negative (Lupus inhibitor, anticardiolipin antibodies, and anti beta-2  glycoprotein antibodies were negative; protein C activity 85%, protein S  activity 76%, factor V Leiden, and prothrombin gene mutation ) - IVC filter in place; could not be removed due to significant thrombus burden on filter at [facility].  - Holding warfarin as above - Start heparin . D/w PharmD   Obesity  - BMI significantly improved, will need GLP-1 later if considering txp - Body mass index is 36.66 kg/m.   CAD - LHC in 5/23 w/ nonobstructive CAD involving RCA - Inferior WMA on most recent echo - stable; no angina    Microcytic anemia - Mild iron  deficiency noted on recent labs   Tobacco use - understands need to quit       I reviewed the LVAD parameters from today, and compared the results to the patient's prior recorded data.  No programming changes were made.  The LVAD is functioning within specified parameters.  The patient performs LVAD self-test daily.  LVAD interrogation was negative for any significant power changes, alarms or PI events/speed drops.  LVAD equipment check completed and is in good working order.  Back-up equipment present.   LVAD education done on emergency procedures and precautions and reviewed exit site care.  Length of Stay: 1  Toribio Fuel, MD 02/07/2024, 3:12 PM  VAD Team --- VAD ISSUES ONLY--- Pager [phone] (7am - 7am)  Advanced Heart Failure Team  Pager [phone] (M-F; 7a - 5p)   Please visit [email or web address]: For overnight coverage please call cardiology fellow first. If fellow not available call Shock/ECMO MD on call.  For ECMO / Mechanical Support (Impella, IABP, LVAD) issues call Shock / ECMO MD on call.    "

## 2024-02-07 NOTE — Plan of Care (Signed)
" °  Problem: Education: Goal: Patient will understand all VAD equipment and how it functions Outcome: Progressing   Problem: Cardiac: Goal: LVAD will function as expected and patient will experience no clinical alarms Outcome: Progressing   Problem: Clinical Measurements: Goal: Diagnostic test results will improve Outcome: Progressing Goal: Respiratory complications will improve Outcome: Progressing   Problem: Activity: Goal: Risk for activity intolerance will decrease Outcome: Progressing   Problem: Coping: Goal: Level of anxiety will decrease Outcome: Progressing   Problem: Safety: Goal: Ability to remain free from injury will improve Outcome: Progressing   "

## 2024-02-07 NOTE — Consult Note (Signed)
 Brief Consult Note  [name] is a very pleasant 49 year old woman who underwent HeartMate 3 implantation in July 2025.  She had initially done well from a cardiac standpoint, however she does note that there were a couple instances in which she got her driveline caught on a shopping cart or something at home, and after that she subsequently started developing drainage around the driveline site.  This was in December 2025, and she has been treated as an outpatient with oral antibiotics.  She continues to have drainage from the driveline site despite these antibiotics, and is now admitted for possible driveline debridement.  On exam, she appears well.  There is no overlying cellulitis on the skin, but there is tenderness along the lateral portion of her driveline.  She is afebrile without leukocytosis.  Vitals:   02/07/24 0813 02/07/24 1135  BP:  126/86  Pulse: (!) 57 66  Resp:  20  Temp:  97.8 F (36.6 C)  SpO2:  98%   Her CT scan demonstrates some stranding and possible fluid around the lateral portion of her driveline.  Given her persistent infection, drainage and pain despite antibiotics, I believe she would benefit from surgical debridement.  We discussed the risks, benefits and alternatives to surgery.  She is in agreement to proceed with surgery.  Will plan for surgical debridement on Tuesday.  Continue to hold Coumadin  and slowly let INR drift down.  Con Clunes, MD Cardiothoracic Surgery Pager: [phone]

## 2024-02-07 NOTE — Progress Notes (Signed)
 PHARMACY - ANTICOAGULATION CONSULT NOTE  Pharmacy Consult for warfarin Indication: LVAD HM3 with hx PE/DVT  Allergies[1]  Patient Measurements: Height: 5' 10 (177.8 cm) Weight: 115.9 kg (255 lb 8.2 oz) IBW/kg (Calculated) : 68.5 HEPARIN  DW (KG): 94.3  Vital Signs: Temp: 97.8 F (36.6 C) (01/17 1135) Temp Source: Oral (01/17 1135) BP: 126/86 (01/17 1135) Pulse Rate: 66 (01/17 1135)  Labs: Recent Labs    02/06/24 1857 02/07/24 0148  HGB 12.2 11.4*  HCT 38.2 34.3*  PLT 130* 111*  LABPROT 21.4* 22.1*  INR 1.8* 1.8*  CREATININE 1.13* 1.17*    Estimated Creatinine Clearance: 82.1 mL/min (A) (by C-G formula based on SCr of 1.17 mg/dL (H)).   Medical History: Past Medical History:  Diagnosis Date   Anemia    Blood transfusion without reported diagnosis    CAD (coronary artery disease)    Nonobstructive   Closed left ankle fracture    August 30 2012   Clotting disorder    DVT (deep venous thrombosis) (HCC)    L leg   Fibroid tumor    HFrEF (heart failure with reduced ejection fraction) (HCC)    Hidradenitis    Hypertension    LVAD (left ventricular assist device) present [facility])    Mitral regurgitation    Myocardial infarction (HCC)    NICM (nonischemic cardiomyopathy) (HCC)    NSVT (nonsustained ventricular tachycardia) (HCC)    Obesity    OSA on CPAP    Pulmonary embolism (HCC) 04/2013   Sleep apnea     Medications:  Scheduled:   atorvastatin   20 mg Oral q1800   digoxin   0.125 mg Oral Daily   gabapentin   300 mg Oral BID   losartan   50 mg Oral Daily   pantoprazole   40 mg Oral Daily   spironolactone   25 mg Oral Daily   torsemide   40 mg Oral Daily    Assessment: 70 yof presenting with concern for driveline infection - plan to admit for IV antibiotics and evaluation for possible I&D. On warfarin PTA for LVAD + hx DVT/PE.   PTA warfarin regimen is  5 mg daily except 7.5 mg Tues/Sat at last clinic appointment.  Discussed with MD - plan to hold warfarin  in case need for I&D will bridge with heparin  if INR < 1.5   Goal of Therapy:  INR 2-3 Heparin  level <0.3 - fixed rate unless directed by MD Monitor platelets by anticoagulation protocol: Yes   Plan:  Hold warfarin  Start heparin  infusion at 500 units/hr fixed rate, no titration if INR<1.5 Monitor daily INR, CBC, and for s/sx of bleeding    Olam Chalk Pharm.D. CPP, BCPS Clinical Pharmacist [phone] 02/07/2024 2:41 PM    Please check AMION for all [facility] Pharmacy phone numbers After 10:00 PM, call Main Pharmacy [phone]       [1]  Allergies Allergen Reactions   Sulfa  Antibiotics Itching, Swelling and Rash    Lip swelling

## 2024-02-08 LAB — BASIC METABOLIC PANEL WITH GFR
Anion gap: 7 (ref 5–15)
BUN: 27 mg/dL — ABNORMAL HIGH (ref 6–20)
CO2: 33 mmol/L — ABNORMAL HIGH (ref 22–32)
Calcium: 9.1 mg/dL (ref 8.9–10.3)
Chloride: 95 mmol/L — ABNORMAL LOW (ref 98–111)
Creatinine, Ser: 1.14 mg/dL — ABNORMAL HIGH (ref 0.44–1.00)
GFR, Estimated: 59 mL/min — ABNORMAL LOW
Glucose, Bld: 68 mg/dL — ABNORMAL LOW (ref 70–99)
Potassium: 4.6 mmol/L (ref 3.5–5.1)
Sodium: 135 mmol/L (ref 135–145)

## 2024-02-08 LAB — LACTATE DEHYDROGENASE: LDH: 167 U/L (ref 105–235)

## 2024-02-08 LAB — PROTIME-INR
INR: 1.4 — ABNORMAL HIGH (ref 0.8–1.2)
Prothrombin Time: 18.2 s — ABNORMAL HIGH (ref 11.4–15.2)

## 2024-02-08 LAB — CBC
HCT: 37 % (ref 36.0–46.0)
Hemoglobin: 12.1 g/dL (ref 12.0–15.0)
MCH: 29.8 pg (ref 26.0–34.0)
MCHC: 32.7 g/dL (ref 30.0–36.0)
MCV: 91.1 fL (ref 80.0–100.0)
Platelets: 123 K/uL — ABNORMAL LOW (ref 150–400)
RBC: 4.06 MIL/uL (ref 3.87–5.11)
RDW: 15.3 % (ref 11.5–15.5)
WBC: 3 K/uL — ABNORMAL LOW (ref 4.0–10.5)
nRBC: 0 % (ref 0.0–0.2)

## 2024-02-08 LAB — HEPARIN LEVEL (UNFRACTIONATED): Heparin Unfractionated: <0.1 [IU]/mL — ABNORMAL LOW (ref 0.30–0.70)

## 2024-02-08 MED ORDER — CARMEX CLASSIC LIP BALM EX OINT
TOPICAL_OINTMENT | CUTANEOUS | Status: DC | PRN
  Filled 2024-02-08: qty 10

## 2024-02-08 NOTE — Progress Notes (Signed)
 PHARMACY - ANTICOAGULATION CONSULT NOTE  Pharmacy Consult for warfarin Indication: LVAD HM3 with hx PE/DVT  Allergies[1]  Patient Measurements: Height: 5' 10 (177.8 cm) Weight: 114 kg (251 lb 5.2 oz) IBW/kg (Calculated) : 68.5 HEPARIN  DW (KG): 94.3  Vital Signs: Temp: 98.1 F (36.7 C) (01/18 0400) Temp Source: Oral (01/18 0400) BP: 106/94 (01/18 0400) Pulse Rate: 57 (01/18 0400)  Labs: Recent Labs    02/06/24 1857 02/07/24 0148 02/08/24 0201  HGB 12.2 11.4* 12.1  HCT 38.2 34.3* 37.0  PLT 130* 111* 123*  LABPROT 21.4* 22.1* 18.2*  INR 1.8* 1.8* 1.4*  HEPARINUNFRC  --   --  <0.10*  CREATININE 1.13* 1.17* 1.14*    Estimated Creatinine Clearance: 83.5 mL/min (A) (by C-G formula based on SCr of 1.14 mg/dL (H)).   Medical History: Past Medical History:  Diagnosis Date   Anemia    Blood transfusion without reported diagnosis    CAD (coronary artery disease)    Nonobstructive   Closed left ankle fracture    August 30 2012   Clotting disorder    DVT (deep venous thrombosis) (HCC)    L leg   Fibroid tumor    HFrEF (heart failure with reduced ejection fraction) (HCC)    Hidradenitis    Hypertension    LVAD (left ventricular assist device) present [facility])    Mitral regurgitation    Myocardial infarction (HCC)    NICM (nonischemic cardiomyopathy) (HCC)    NSVT (nonsustained ventricular tachycardia) (HCC)    Obesity    OSA on CPAP    Pulmonary embolism (HCC) 04/2013   Sleep apnea     Medications:  Scheduled:   atorvastatin   20 mg Oral q1800   digoxin   0.125 mg Oral Daily   gabapentin   300 mg Oral BID   losartan   50 mg Oral Daily   pantoprazole   40 mg Oral Daily   spironolactone   25 mg Oral Daily   torsemide   40 mg Oral Daily    Assessment: 58 yof presenting with concern for driveline infection - plan to admit for IV antibiotics and evaluation for possible I&D. On warfarin PTA for LVAD + hx DVT/PE.   PTA warfarin regimen is  5 mg daily except 7.5 mg  Tues/Sat at last clinic appointment.  02/07/24: INR decreased to 1.4 today off warfarin. Heparin  level <0.10, undetectable as anticipated on heparin  500 units/hr. No issues with infusion running or signs of bleeding per RN. CBC stable (Hgb 12.1, PLT 123). Per discussion with AHF MD, plan to continue low dose heparin  infusion while INR remains <1.5.   Goal of Therapy:  INR 2-3 Heparin  level <0.3 - fixed rate unless directed by MD Monitor platelets by anticoagulation protocol: Yes   Plan:  Hold warfarin  Continue heparin  infusion at 500 units/hr fixed rate, no titration if INR<1.5 Monitor daily INR, CBC, and for s/sx of bleeding  Morna Breach, PharmD, BCPS PGY2 Cardiology Pharmacy Resident 02/08/2024 6:31 AM  Please check AMION for all [facility] Pharmacy phone numbers After 10:00 PM, call Main Pharmacy [phone]    [1]  Allergies Allergen Reactions   Sulfa  Antibiotics Itching, Swelling and Rash    Lip swelling

## 2024-02-08 NOTE — Progress Notes (Signed)
 "  Advanced Heart Failure VAD Team Note  PCP-Cardiologist: None  Chief Complaint: LVAD infection  Patient Profile   [name] [name] is a 49 y.o. female with LVAD DL infection  Significant events:    Admitted 1/16 for DL infection  Subjective:    On unasyn . No f/c. Feels well   LVAD INTERROGATION:  HeartMate 3 LVAD:   Flow 4.2 liters/min, speed 6000, power 4.7, PI 3.0.    Objective:   Vital Signs:   Temp:  [97.6 F (36.4 C)-98.5 F (36.9 C)] 98.2 F (36.8 C) (01/18 0746) Pulse Rate:  [57-66] 61 (01/18 0746) Resp:  [15-20] 16 (01/18 0746) BP: (85-126)/(73-94) 102/87 (01/18 0746) SpO2:  [97 %-99 %] 98 % (01/18 0746) Weight:  [885 kg] 114 kg (01/18 0407) Last BM Date : 02/06/24 Mean arterial Pressure 90s  Intake/Output:   Intake/Output Summary (Last 24 hours) at 02/08/2024 0954 Last data filed at 02/08/2024 0748 Gross per 24 hour  Intake 1758.81 ml  Output 3450 ml  Net -1691.19 ml    Physical Exam   General:  Sitting up in chair NAD.  HEENT: normal  Neck: supple. JVP not elevated.  Carotids 2+ bilat; no bruits. No lymphadenopathy or thryomegaly appreciated. Cor: LVAD hum.  Lungs: Clear. Abdomen: obese soft, nontender, non-distended. No hepatosplenomegaly. No bruits or masses. Good bowel sounds. Driveline site clean. Anchor in place.  Extremities: no cyanosis, clubbing, rash. Warm no edema  Neuro: alert & oriented x 3. No focal deficits. Moves all 4 without problem    Telemetry   Sinus  60s Personally reviewed  Labs  Basic Metabolic Panel: Recent Labs  Lab 02/04/24 1104 02/06/24 1857 02/07/24 0148 02/08/24 0201  NA 136 134* 133* 135  K 4.9 4.7 4.5 4.6  CL 96* 96* 97* 95*  CO2 33* 32 29 33*  GLUCOSE 84 77 82 68*  BUN 36* 27* 28* 27*  CREATININE 1.40* 1.13* 1.17* 1.14*  CALCIUM  9.4 9.4 9.0 9.1    Liver Function Tests: No results for input(s): AST, ALT, ALKPHOS, BILITOT, PROT, ALBUMIN  in the last 168 hours. No results for input(s):  LIPASE, AMYLASE in the last 168 hours. No results for input(s): AMMONIA in the last 168 hours.  CBC: Recent Labs  Lab 02/04/24 1104 02/06/24 1857 02/07/24 0148 02/08/24 0201  WBC 3.2* 4.1 3.8* 3.0*  HGB 13.5 12.2 11.4* 12.1  HCT 41.5 38.2 34.3* 37.0  MCV 92.0 92.7 91.0 91.1  PLT 150 130* 111* 123*    INR: Recent Labs  Lab 02/04/24 1104 02/06/24 1857 02/07/24 0148 02/08/24 0201  INR 1.9* 1.8* 1.8* 1.4*    Medications:    Scheduled Medications:  atorvastatin   20 mg Oral q1800   digoxin   0.125 mg Oral Daily   gabapentin   300 mg Oral BID   losartan   50 mg Oral Daily   pantoprazole   40 mg Oral Daily   spironolactone   25 mg Oral Daily   torsemide   40 mg Oral Daily    Infusions:  ampicillin -sulbactam (UNASYN ) IV 3 g (02/08/24 0825)   heparin  500 Units/hr (02/08/24 0449)    PRN Medications: acetaminophen , albuterol , lip balm, ondansetron  (ZOFRAN ) IV, oxyCODONE -acetaminophen , traMADol   Assessment/Plan:    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  Related to poor outpatient dressing care.  Plan for CT scan with hopeful culture. - Direct admission for IV antibiotics, scan, and potential CT surgery consult - CT abdomen pelvis with contrast - unremarkable except for R breast mass (see  below) - Continue IV unasyn  - Plan for I&D on Tuesday with Dr. Destany Severns  Chronic systolic HF with biventricular dysfunction/stage D cardiomyopathy s/p HM3 07/24/2023 with concurrent MVR - Continue speed at 6000 RPM, stable - Stable NYHA class II - MAPS elevated. Increase losartan  50 -> 100 mg daily, consider transition to [facility] inpatient, potentially after OR if needed -Continue torsemide  40mg  daily -Continue digoxin  0.125 mg daily, last dig level <0.6 -Continue spironolactone  25mg  daily - Holding warfarin given need for OR - INR 1.4 - On heparin . D/w Pharm D   Severe MR s/p MVR - Echo 4/25 with severe MR  - TEER on 07/09/23  complicated by worsening MR - S/p MVR   Hx of PE/DVT w/ IVC filter - Evaluated at [facility] where thrombophilia work-up was negative (Lupus inhibitor, anticardiolipin antibodies, and anti beta-2  glycoprotein antibodies were negative; protein C activity 85%, protein S activity 76%, factor V Leiden, and prothrombin gene mutation ) - IVC filter in place; could not be removed due to significant thrombus burden on filter at [facility].  - Holding warfarin as above - Continue heparin  while off warfarin    Obesity  - BMI significantly improved, will need GLP-1 later if considering txp - Body mass index is 36.06 kg/m.   CAD - LHC in 5/23 w/ nonobstructive CAD involving RCA - Inferior WMA on most recent echo - no s/s angina   Tobacco use - understands need to quit   Right breast mass on CT - malignancy vs infection - will need mammogram      I reviewed the LVAD parameters from today, and compared the results to the patient's prior recorded data.  No programming changes were made.  The LVAD is functioning within specified parameters.  The patient performs LVAD self-test daily.  LVAD interrogation was negative for any significant power changes, alarms or PI events/speed drops.  LVAD equipment check completed and is in good working order.  Back-up equipment present.   LVAD education done on emergency procedures and precautions and reviewed exit site care.  Length of Stay: 2  Toribio Fuel, MD 02/08/2024, 9:54 AM  VAD Team --- VAD ISSUES ONLY--- Pager [phone] (7am - 7am)  Advanced Heart Failure Team  Pager [phone] (M-F; 7a - 5p)   Please visit [email or web address]: For overnight coverage please call cardiology fellow first. If fellow not available call Shock/ECMO MD on call.  For ECMO / Mechanical Support (Impella, IABP, LVAD) issues call Shock / ECMO MD on call.    "

## 2024-02-08 NOTE — Plan of Care (Signed)
" °  Problem: Education: Goal: Patient will understand all VAD equipment and how it functions Outcome: Progressing Goal: Patient will be able to verbalize current INR target range and antiplatelet therapy for discharge home Outcome: Progressing   Problem: Cardiac: Goal: LVAD will function as expected and patient will experience no clinical alarms Outcome: Progressing   Problem: Education: Goal: Knowledge of General Education information will improve Description: Including pain rating scale, medication(s)/side effects and non-pharmacologic comfort measures Outcome: Progressing   Problem: Health Behavior/Discharge Planning: Goal: Ability to manage health-related needs will improve Outcome: Progressing   Problem: Clinical Measurements: Goal: Ability to maintain clinical measurements within normal limits will improve Outcome: Progressing Goal: Diagnostic test results will improve Outcome: Progressing Goal: Respiratory complications will improve Outcome: Progressing Goal: Cardiovascular complication will be avoided Outcome: Progressing   Problem: Activity: Goal: Risk for activity intolerance will decrease Outcome: Progressing   Problem: Nutrition: Goal: Adequate nutrition will be maintained Outcome: Progressing   Problem: Coping: Goal: Level of anxiety will decrease Outcome: Progressing   Problem: Elimination: Goal: Will not experience complications related to bowel motility Outcome: Progressing Goal: Will not experience complications related to urinary retention Outcome: Progressing   Problem: Pain Managment: Goal: General experience of comfort will improve and/or be controlled Outcome: Progressing   Problem: Safety: Goal: Ability to remain free from injury will improve Outcome: Progressing   Problem: Skin Integrity: Goal: Risk for impaired skin integrity will decrease Outcome: Progressing   "

## 2024-02-09 ENCOUNTER — Inpatient Hospital Stay (HOSPITAL_COMMUNITY)

## 2024-02-09 ENCOUNTER — Encounter (HOSPITAL_COMMUNITY): Payer: Self-pay

## 2024-02-09 ENCOUNTER — Ambulatory Visit (HOSPITAL_COMMUNITY)

## 2024-02-09 LAB — BASIC METABOLIC PANEL WITH GFR
Anion gap: 7 (ref 5–15)
BUN: 38 mg/dL — ABNORMAL HIGH (ref 6–20)
CO2: 32 mmol/L (ref 22–32)
Calcium: 9.2 mg/dL (ref 8.9–10.3)
Chloride: 96 mmol/L — ABNORMAL LOW (ref 98–111)
Creatinine, Ser: 1.28 mg/dL — ABNORMAL HIGH (ref 0.44–1.00)
GFR, Estimated: 52 mL/min — ABNORMAL LOW
Glucose, Bld: 80 mg/dL (ref 70–99)
Potassium: 4.8 mmol/L (ref 3.5–5.1)
Sodium: 135 mmol/L (ref 135–145)

## 2024-02-09 LAB — CBC
HCT: 35.2 % — ABNORMAL LOW (ref 36.0–46.0)
Hemoglobin: 11.4 g/dL — ABNORMAL LOW (ref 12.0–15.0)
MCH: 29.8 pg (ref 26.0–34.0)
MCHC: 32.4 g/dL (ref 30.0–36.0)
MCV: 91.9 fL (ref 80.0–100.0)
Platelets: 119 K/uL — ABNORMAL LOW (ref 150–400)
RBC: 3.83 MIL/uL — ABNORMAL LOW (ref 3.87–5.11)
RDW: 15.4 % (ref 11.5–15.5)
WBC: 3.4 K/uL — ABNORMAL LOW (ref 4.0–10.5)
nRBC: 0 % (ref 0.0–0.2)

## 2024-02-09 LAB — PREGNANCY, URINE: Preg Test, Ur: NEGATIVE

## 2024-02-09 LAB — URINALYSIS, ROUTINE W REFLEX MICROSCOPIC
Bilirubin Urine: NEGATIVE
Glucose, UA: NEGATIVE mg/dL
Hgb urine dipstick: NEGATIVE
Ketones, ur: NEGATIVE mg/dL
Leukocytes,Ua: NEGATIVE
Nitrite: NEGATIVE
Protein, ur: NEGATIVE mg/dL
Specific Gravity, Urine: 1.013 (ref 1.005–1.030)
pH: 5 (ref 5.0–8.0)

## 2024-02-09 LAB — LACTATE DEHYDROGENASE: LDH: 179 U/L (ref 105–235)

## 2024-02-09 LAB — PROTIME-INR
INR: 1.2 (ref 0.8–1.2)
Prothrombin Time: 16.4 s — ABNORMAL HIGH (ref 11.4–15.2)

## 2024-02-09 LAB — TYPE AND SCREEN
ABO/RH(D): A POS
Antibody Screen: NEGATIVE

## 2024-02-09 LAB — HEPARIN LEVEL (UNFRACTIONATED): Heparin Unfractionated: <0.1 [IU]/mL — ABNORMAL LOW (ref 0.30–0.70)

## 2024-02-09 MED ORDER — CHLORHEXIDINE GLUCONATE 0.12 % MT SOLN
15.0000 mL | Freq: Once | OROMUCOSAL | Status: AC
  Administered 2024-02-10: 15 mL via OROMUCOSAL
  Filled 2024-02-09: qty 15

## 2024-02-09 MED ORDER — CHLORHEXIDINE GLUCONATE CLOTH 2 % EX PADS
6.0000 | MEDICATED_PAD | Freq: Once | CUTANEOUS | Status: AC
  Administered 2024-02-09: 6 via TOPICAL

## 2024-02-09 MED ORDER — CHLORHEXIDINE GLUCONATE CLOTH 2 % EX PADS
6.0000 | MEDICATED_PAD | Freq: Once | CUTANEOUS | Status: AC
  Administered 2024-02-10: 6 via TOPICAL

## 2024-02-09 NOTE — Progress Notes (Signed)
 PHARMACY - ANTICOAGULATION CONSULT NOTE  Pharmacy Consult for warfarin Indication: LVAD HM3 with hx PE/DVT  Allergies[1]  Patient Measurements: Height: 5' 10 (177.8 cm) Weight: 114.2 kg (251 lb 12.3 oz) IBW/kg (Calculated) : 68.5 HEPARIN  DW (KG): 94.3  Vital Signs: Temp: 97.6 F (36.4 C) (01/19 1158) Temp Source: Oral (01/19 1158) BP: 89/76 (01/19 1158) Pulse Rate: 60 (01/19 1158)  Labs: Recent Labs    02/07/24 0148 02/08/24 0201 02/09/24 0111  HGB 11.4* 12.1 11.4*  HCT 34.3* 37.0 35.2*  PLT 111* 123* 119*  LABPROT 22.1* 18.2* 16.4*  INR 1.8* 1.4* 1.2  HEPARINUNFRC  --  <0.10* <0.10*  CREATININE 1.17* 1.14* 1.28*    Estimated Creatinine Clearance: 74.5 mL/min (A) (by C-G formula based on SCr of 1.28 mg/dL (H)).   Medical History: Past Medical History:  Diagnosis Date   Anemia    Blood transfusion without reported diagnosis    CAD (coronary artery disease)    Nonobstructive   Closed left ankle fracture    August 30 2012   Clotting disorder    DVT (deep venous thrombosis) (HCC)    L leg   Fibroid tumor    HFrEF (heart failure with reduced ejection fraction) (HCC)    Hidradenitis    Hypertension    LVAD (left ventricular assist device) present [facility])    Mitral regurgitation    Myocardial infarction (HCC)    NICM (nonischemic cardiomyopathy) (HCC)    NSVT (nonsustained ventricular tachycardia) (HCC)    Obesity    OSA on CPAP    Pulmonary embolism (HCC) 04/2013   Sleep apnea     Medications:  Scheduled:   atorvastatin   20 mg Oral q1800   digoxin   0.125 mg Oral Daily   gabapentin   300 mg Oral BID   losartan   50 mg Oral Daily   pantoprazole   40 mg Oral Daily   spironolactone   25 mg Oral Daily   torsemide   40 mg Oral Daily    Assessment: 52 yof presenting with concern for driveline infection - plan to admit for IV antibiotics and evaluation for possible I&D. On warfarin PTA for LVAD + hx DVT/PE.   PTA warfarin regimen is  5 mg daily except 7.5 mg  Tues/Sat at last clinic appointment.  02/07/24: INR decreased to 1.4 today off warfarin. Heparin  level <0.10, undetectable as anticipated on heparin  500 units/hr. No issues with infusion running or signs of bleeding per RN. CBC stable (Hgb 12.1, PLT 123). Per discussion with AHF MD, plan to continue low dose heparin  infusion while INR remains <1.5.   Goal of Therapy:  INR 2-3 Heparin  level <0.3 - fixed rate unless directed by MD Monitor platelets by anticoagulation protocol: Yes   Plan:  Hold warfarin  Continue heparin  infusion at 500 units/hr fixed rate, no titration. Monitor daily INR, CBC, and for s/sx of bleeding Awaiting I&D tomorrow.  [name] [name], [name] [name], BCCP Clinical Pharmacist  02/09/2024 1:45 PM   [facility] pharmacy phone numbers are listed on [email or web address]        [1]  Allergies Allergen Reactions   Sulfa  Antibiotics Itching, Swelling and Rash    Lip swelling

## 2024-02-09 NOTE — Progress Notes (Signed)
" ° ° °  PROCEDURAL EXPEDITER PROGRESS NOTE  Patient Name: [name] [name]  DOB:May 29, 1975 Date of Admission: 02/06/2024  Date of Assessment:02/09/24   -------------------------------------------------------------------------------------------------------------------   Brief clinical summary: patient is going for surgery on 02-10-24 with Dr. Daniel for a wound debridement  Orders in place:   No   Communication with surgical team if no orders: IB MD  Labs, test, and orders reviewed: yes  Barriers noted: surgical orders needed   Intervention provided by [facility] team: IB MD  Barrier resolved:   yes, orders received   -------------------------------------------------------------------------------------------------------------------  [employer] Expediter, [name] [name] Please contact us  directly via secure chat (search for [facility]) or by calling us  at [phone] [facility]).  "

## 2024-02-09 NOTE — Progress Notes (Addendum)
 LVAD Coordinator Rounding Note:  Admitted 02/06/24 from VAD clinic for drive line infection to advanced heart failure service  HM III LVAD implanted on 07/24/23 by Dr. Lucas under destination criteria.  Pt laying in bed on my arrival. Denies complaints.   Plan for drive line debridement tomorrow with Dr Daniel.   Dressing changed today at bedside. See below for documentation.   Vital signs: Temp: 97.9 HR: 64 Doppler Pressure: 100 Automatic BP: 115/93 (101) O2 Sat: 97% on RA Wt: 251.7  lbs  LVAD interrogation reveals:  Speed: 6000 Flow: 4.4 Power: 4.8 w PI: 3.1  Alarms: none Events:  2 PI events so far today Hematocrit: 35  Fixed speed: 6000 Low speed limit: 5700  Drive Line:  Existing VAD dressing removed and site care performed using sterile technique. Drive line exit site cleaned with Chlora prep applicators x 2, allowed to dry, and gauze dressing with Silverlon patch applied to exit site. Velour exposed 1 inch at exit site. Site does not tunnel. Unable to express drainage. Abdomen is tender tracking along drive line. Small area of induration noted below sternum. Scant amount of thick, tan, foul smelling drainage present at exit site/on previous dressing. No rash present. Drive line anchor intact.     Labs:  LDH trend: 179  INR trend: 1.2  Anticoagulation Plan: -INR Goal: 2.0-3.0 -ASA Dose: n/a - Coumadin  dosing per pharmacy  Arrythmias:  HX: NSVT on p.o. amiodarone   Infection: 01/04/25>> wound cx >> few eikenella corrodens 02/06/24>> blood cultures >> no growth 3 days  Drips:  Heparin  500 units/hr  Adverse Events on VAD:   Plan/Recommendations:  1. Page VAD coordinator with any VAD alarms or equipment issues. 2. Daily drive line dressing changes per bedside RN or VAD coordinator 3. VAD coordinator will accompany pt to OR tomorrow morning  [name] RN VAD Coordinator  Office: [phone]  24/7 Pager: [phone]

## 2024-02-09 NOTE — Progress Notes (Signed)
 Patient is scheduled for surgery tomorrow and will need a pregnancy test prior to receiving anesthesia. Order placed in EPIC and Rosina Rummer, floor RN made aware that sample was needed.

## 2024-02-09 NOTE — Progress Notes (Signed)
 "  Advanced Heart Failure VAD Team Note  PCP-Cardiologist: None  Chief Complaint: LVAD infection  Patient Profile   [name] [name] is a 49 y.o. female with LVAD DL infection  Significant events:    Admitted 1/16 for DL infection  Subjective:    Continues on Unasyn . BC NG X 3 days.   OR tomorrow for I&D with Dr. Daniel.   LVAD INTERROGATION:  HeartMate 3 VAD Equipment Check Pump Speed (RPM): 6000 RPM Pump Flow (LPM): 4.0 Power (Watts): 5 Watts Pulsatility Index: 3.1 Fixed Speed Limit: 6000 rpm Low Speed Limit: 5700 rpm Alarms: No alarms Auscultated: Normal expected humming Power Module Self-Test (Daily): Done System Controller Self-Test: Passed Patient Battery Source: [facility] / Wall unit Emergency Equipment at Bedside: Yes   2 PI events  Objective:   Vital Signs:   Temp:  [97.6 F (36.4 C)-98.2 F (36.8 C)] 97.6 F (36.4 C) (01/19 1158) Pulse Rate:  [60-72] 60 (01/19 1158) Resp:  [15-20] 15 (01/19 1158) BP: (89-115)/(73-97) 89/76 (01/19 1158) SpO2:  [97 %-100 %] 100 % (01/19 1158) Weight:  [114.2 kg] 114.2 kg (01/19 0649) Last BM Date : 02/06/24 Mean arterial Pressure 82  Intake/Output:   Intake/Output Summary (Last 24 hours) at 02/09/2024 1348 Last data filed at 02/09/2024 1210 Gross per 24 hour  Intake 806.71 ml  Output 2200 ml  Net -1393.29 ml    Physical Exam   Physical Exam: GENERAL: Sitting up in bed. No distress CARDIAC:  Mechanical heart sounds with LVAD hum present.  LUNGS:  Breathing nonlabored  LVAD exit site:   Dressing dry and intact.  Stabilization device present and accurately applied.   EXTREMITIES:  No edema  NEUROLOGIC:  Alert and oriented x 4. Affect pleasant.       Telemetry   SR 60s  Labs  Basic Metabolic Panel: Recent Labs  Lab 02/04/24 1104 02/06/24 1857 02/07/24 0148 02/08/24 0201 02/09/24 0111  NA 136 134* 133* 135 135  K 4.9 4.7 4.5 4.6 4.8  CL 96* 96* 97* 95* 96*  CO2 33* 32 29 33* 32  GLUCOSE 84 77 82 68* 80   BUN 36* 27* 28* 27* 38*  CREATININE 1.40* 1.13* 1.17* 1.14* 1.28*  CALCIUM  9.4 9.4 9.0 9.1 9.2    Liver Function Tests: No results for input(s): AST, ALT, ALKPHOS, BILITOT, PROT, ALBUMIN  in the last 168 hours. No results for input(s): LIPASE, AMYLASE in the last 168 hours. No results for input(s): AMMONIA in the last 168 hours.  CBC: Recent Labs  Lab 02/04/24 1104 02/06/24 1857 02/07/24 0148 02/08/24 0201 02/09/24 0111  WBC 3.2* 4.1 3.8* 3.0* 3.4*  HGB 13.5 12.2 11.4* 12.1 11.4*  HCT 41.5 38.2 34.3* 37.0 35.2*  MCV 92.0 92.7 91.0 91.1 91.9  PLT 150 130* 111* 123* 119*    INR: Recent Labs  Lab 02/04/24 1104 02/06/24 1857 02/07/24 0148 02/08/24 0201 02/09/24 0111  INR 1.9* 1.8* 1.8* 1.4* 1.2    Medications:    Scheduled Medications:  atorvastatin   20 mg Oral q1800   digoxin   0.125 mg Oral Daily   gabapentin   300 mg Oral BID   losartan   50 mg Oral Daily   pantoprazole   40 mg Oral Daily   spironolactone   25 mg Oral Daily   torsemide   40 mg Oral Daily    Infusions:  ampicillin -sulbactam (UNASYN ) IV 3 g (02/09/24 0808)   heparin  500 Units/hr (02/09/24 1010)    PRN Medications: acetaminophen , albuterol , lip balm, ondansetron  (ZOFRAN ) IV,  oxyCODONE -acetaminophen , traMADol   Assessment/Plan:    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  Related to poor outpatient dressing care.   - CT abdomen pelvis with contrast - unremarkable except for R breast mass (see below) - Continue IV unasyn . BC NGTD. Afebrile. - Plan for I&D tomorrow with Dr. Daniel  Chronic systolic HF with biventricular dysfunction/stage D cardiomyopathy s/p HM3 07/24/2023 with concurrent MVR - Continue speed at 6000 RPM, stable - Stable NYHA class II - MAP elevated last night, better this am. May need to increase losartan  to 100 mg daily after surgery -Continue torsemide  40mg  daily -Continue digoxin  0.125 mg daily, last dig level  <0.6 -Continue spironolactone  25mg  daily - Holding warfarin given need for OR - INR 1.2. On heparin  gtt, discussed with HF PharmD.   Severe MR s/p MVR - Echo 4/25 with severe MR  - TEER on 07/09/23 complicated by worsening MR - S/p MVR   Hx of PE/DVT w/ IVC filter - Evaluated at [facility] where thrombophilia work-up was negative (Lupus inhibitor, anticardiolipin antibodies, and anti beta-2  glycoprotein antibodies were negative; protein C activity 85%, protein S activity 76%, factor V Leiden, and prothrombin gene mutation ) - IVC filter in place; could not be removed due to significant thrombus burden on filter at [facility].  - Holding warfarin as above - Continue heparin  while off warfarin    Obesity  - BMI significantly improved, will need GLP-1 later if considering txp - Body mass index is 36.12 kg/m.   CAD - LHC in 5/23 w/ nonobstructive CAD involving RCA - Inferior WMA on most recent echo - no s/s angina   Tobacco use - understands need to quit   Right breast mass on CT - malignancy vs infection - will need mammogram - Has had R breast biopsy in 2022 which was benign    I reviewed the LVAD parameters from today, and compared the results to the patient's prior recorded data.  No programming changes were made.  The LVAD is functioning within specified parameters.  The patient performs LVAD self-test daily.  LVAD interrogation was negative for any significant power changes, alarms or PI events/speed drops.  LVAD equipment check completed and is in good working order.  Back-up equipment present.   LVAD education done on emergency procedures and precautions and reviewed exit site care.  Length of Stay: 3  [name] [name], [name] SAILOR, PA-C 02/09/2024, 1:48 PM  VAD Team --- VAD ISSUES ONLY--- Pager [phone] (7am - 7am)  Advanced Heart Failure Team  Pager [phone] (M-F; 7a - 5p)   Please visit [email or web address]: For overnight coverage please call cardiology fellow first. If fellow not available call  Shock/ECMO MD on call.  For ECMO / Mechanical Support (Impella, IABP, LVAD) issues call Shock / ECMO MD on call.    "

## 2024-02-09 NOTE — Care Management Important Message (Signed)
 Important Message  Patient Details  Name: [name] [name] MRN: [id number] Date of Birth: February 07, 1975   Important Message Given:  Yes - Medicare IM     Claretta Deed 02/09/2024, 3:21 PM

## 2024-02-10 ENCOUNTER — Encounter (HOSPITAL_COMMUNITY): Payer: Self-pay | Admitting: Cardiology

## 2024-02-10 ENCOUNTER — Encounter (HOSPITAL_COMMUNITY)
Admission: AD | Disposition: A | Discharge status: Home / Self Care | Payer: Self-pay | Source: Ambulatory Visit | Attending: Cardiology

## 2024-02-10 ENCOUNTER — Inpatient Hospital Stay (HOSPITAL_COMMUNITY): Admitting: Certified Registered Nurse Anesthetist

## 2024-02-10 DIAGNOSIS — I110 Hypertensive heart disease with heart failure: Secondary | ICD-10-CM | POA: Diagnosis not present

## 2024-02-10 DIAGNOSIS — I5020 Unspecified systolic (congestive) heart failure: Secondary | ICD-10-CM | POA: Diagnosis not present

## 2024-02-10 DIAGNOSIS — I2510 Atherosclerotic heart disease of native coronary artery without angina pectoris: Secondary | ICD-10-CM

## 2024-02-10 DIAGNOSIS — T827XXA Infection and inflammatory reaction due to other cardiac and vascular devices, implants and grafts, initial encounter: Secondary | ICD-10-CM | POA: Diagnosis not present

## 2024-02-10 HISTORY — PX: STERNAL WOUND DEBRIDEMENT: SHX1058

## 2024-02-10 LAB — HEMOGLOBIN A1C
Hgb A1c MFr Bld: 5.5 % (ref 4.8–5.6)
Mean Plasma Glucose: 111.15 mg/dL

## 2024-02-10 LAB — GLUCOSE, CAPILLARY
Glucose-Capillary: 72 mg/dL (ref 70–99)
Glucose-Capillary: 77 mg/dL (ref 70–99)

## 2024-02-10 LAB — SURGICAL PCR SCREEN
MRSA, PCR: NEGATIVE
Staphylococcus aureus: NEGATIVE

## 2024-02-10 LAB — BASIC METABOLIC PANEL WITH GFR
Anion gap: 6 (ref 5–15)
BUN: 42 mg/dL — ABNORMAL HIGH (ref 6–20)
CO2: 33 mmol/L — ABNORMAL HIGH (ref 22–32)
Calcium: 9.4 mg/dL (ref 8.9–10.3)
Chloride: 98 mmol/L (ref 98–111)
Creatinine, Ser: 1.43 mg/dL — ABNORMAL HIGH (ref 0.44–1.00)
GFR, Estimated: 45 mL/min — ABNORMAL LOW
Glucose, Bld: 63 mg/dL — ABNORMAL LOW (ref 70–99)
Potassium: 4.6 mmol/L (ref 3.5–5.1)
Sodium: 137 mmol/L (ref 135–145)

## 2024-02-10 LAB — PROTIME-INR
INR: 1.1 (ref 0.8–1.2)
Prothrombin Time: 14.9 s (ref 11.4–15.2)

## 2024-02-10 LAB — HEPARIN LEVEL (UNFRACTIONATED): Heparin Unfractionated: <0.1 [IU]/mL — ABNORMAL LOW (ref 0.30–0.70)

## 2024-02-10 LAB — CBC
HCT: 35.4 % — ABNORMAL LOW (ref 36.0–46.0)
Hemoglobin: 11.5 g/dL — ABNORMAL LOW (ref 12.0–15.0)
MCH: 29.6 pg (ref 26.0–34.0)
MCHC: 32.5 g/dL (ref 30.0–36.0)
MCV: 91.2 fL (ref 80.0–100.0)
Platelets: 120 K/uL — ABNORMAL LOW (ref 150–400)
RBC: 3.88 MIL/uL (ref 3.87–5.11)
RDW: 15.4 % (ref 11.5–15.5)
WBC: 3.5 K/uL — ABNORMAL LOW (ref 4.0–10.5)
nRBC: 0 % (ref 0.0–0.2)

## 2024-02-10 LAB — LACTATE DEHYDROGENASE: LDH: 196 U/L (ref 105–235)

## 2024-02-10 LAB — APTT: aPTT: 33 s (ref 24–36)

## 2024-02-10 MED ORDER — PHENYLEPHRINE 80 MCG/ML (10ML) SYRINGE FOR IV PUSH (FOR BLOOD PRESSURE SUPPORT)
PREFILLED_SYRINGE | INTRAVENOUS | Status: DC | PRN
  Administered 2024-02-10: 80 ug via INTRAVENOUS

## 2024-02-10 MED ORDER — MIDAZOLAM HCL (PF) 2 MG/2ML IJ SOLN
INTRAMUSCULAR | Status: DC | PRN
  Administered 2024-02-10 (×2): 1 mg via INTRAVENOUS

## 2024-02-10 MED ORDER — FENTANYL CITRATE (PF) 100 MCG/2ML IJ SOLN: 25.0000 ug | INTRAMUSCULAR | Status: DC | PRN

## 2024-02-10 MED ORDER — ONDANSETRON HCL 4 MG/2ML IJ SOLN: 4.0000 mg | Freq: Four times a day (QID) | INTRAMUSCULAR | Status: DC | PRN

## 2024-02-10 MED ORDER — LIDOCAINE 2% (20 MG/ML) 5 ML SYRINGE
INTRAMUSCULAR | Status: DC | PRN
  Administered 2024-02-10: 60 mg via INTRAVENOUS

## 2024-02-10 MED ORDER — ALBUMIN HUMAN 5 % IV SOLN: INTRAVENOUS | Status: DC | PRN

## 2024-02-10 MED ORDER — HEPARIN (PORCINE) 25000 UT/250ML-% IV SOLN
700.0000 [IU]/h | INTRAVENOUS | Status: DC
  Administered 2024-02-11 – 2024-02-15 (×3): 500 [IU]/h via INTRAVENOUS
  Administered 2024-02-16 – 2024-02-19 (×3): 700 [IU]/h via INTRAVENOUS
  Filled 2024-02-10 (×6): qty 250

## 2024-02-10 MED ORDER — ROCURONIUM BROMIDE 10 MG/ML (PF) SYRINGE
PREFILLED_SYRINGE | INTRAVENOUS | Status: AC
  Filled 2024-02-10: qty 10

## 2024-02-10 MED ORDER — PHENYLEPHRINE HCL-NACL 20-0.9 MG/250ML-% IV SOLN
INTRAVENOUS | Status: DC | PRN
  Administered 2024-02-10: 20 ug/min via INTRAVENOUS

## 2024-02-10 MED ORDER — VASHE WOUND IRRIGATION OPTIME
TOPICAL | Status: DC | PRN
  Administered 2024-02-10 (×2): 34 [oz_av]

## 2024-02-10 MED ORDER — OXYCODONE HCL 5 MG/5ML PO SOLN: 5.0000 mg | Freq: Once | ORAL | Status: DC | PRN

## 2024-02-10 MED ORDER — OXYCODONE HCL 5 MG PO TABS: 5.0000 mg | ORAL_TABLET | Freq: Once | ORAL | Status: DC | PRN

## 2024-02-10 MED ORDER — ORAL CARE MOUTH RINSE: 15.0000 mL | Freq: Once | OROMUCOSAL | Status: DC

## 2024-02-10 MED ORDER — LIDOCAINE 2% (20 MG/ML) 5 ML SYRINGE
INTRAMUSCULAR | Status: AC
  Filled 2024-02-10: qty 5

## 2024-02-10 MED ORDER — ETOMIDATE 2 MG/ML IV SOLN
INTRAVENOUS | Status: AC
  Filled 2024-02-10: qty 10

## 2024-02-10 MED ORDER — ONDANSETRON HCL 4 MG/2ML IJ SOLN
INTRAMUSCULAR | Status: DC | PRN
  Administered 2024-02-10: 4 mg via INTRAVENOUS

## 2024-02-10 MED ORDER — ETOMIDATE 2 MG/ML IV SOLN
INTRAVENOUS | Status: DC | PRN
  Administered 2024-02-10: 20 mg via INTRAVENOUS

## 2024-02-10 MED ORDER — PROPOFOL 10 MG/ML IV BOLUS
INTRAVENOUS | Status: DC | PRN
  Administered 2024-02-10: 20 mg via INTRAVENOUS

## 2024-02-10 MED ORDER — LACTATED RINGERS IV SOLN: INTRAVENOUS | Status: DC

## 2024-02-10 MED ORDER — FENTANYL CITRATE (PF) 100 MCG/2ML IJ SOLN
INTRAMUSCULAR | Status: AC
  Filled 2024-02-10: qty 2

## 2024-02-10 MED ORDER — DEXAMETHASONE SOD PHOSPHATE PF 10 MG/ML IJ SOLN
INTRAMUSCULAR | Status: AC
  Filled 2024-02-10: qty 1

## 2024-02-10 MED ORDER — HYDROMORPHONE HCL 1 MG/ML IJ SOLN
0.5000 mg | INTRAMUSCULAR | Status: DC | PRN
  Administered 2024-02-10 – 2024-02-20 (×29): 0.5 mg via INTRAVENOUS
  Filled 2024-02-10 (×30): qty 0.5

## 2024-02-10 MED ORDER — MIDAZOLAM HCL 2 MG/2ML IJ SOLN
INTRAMUSCULAR | Status: AC
  Filled 2024-02-10: qty 2

## 2024-02-10 MED ORDER — 0.9 % SODIUM CHLORIDE (POUR BTL) OPTIME
TOPICAL | Status: DC | PRN
  Administered 2024-02-10: 2000 mL

## 2024-02-10 MED ORDER — DEXAMETHASONE SOD PHOSPHATE PF 10 MG/ML IJ SOLN
INTRAMUSCULAR | Status: DC | PRN
  Administered 2024-02-10: 5 mg via INTRAVENOUS

## 2024-02-10 MED ORDER — PROPOFOL 10 MG/ML IV BOLUS
INTRAVENOUS | Status: AC
  Filled 2024-02-10: qty 20

## 2024-02-10 MED ORDER — ONDANSETRON HCL 4 MG/2ML IJ SOLN
INTRAMUSCULAR | Status: AC
  Filled 2024-02-10: qty 2

## 2024-02-10 MED ORDER — ROCURONIUM BROMIDE 10 MG/ML (PF) SYRINGE
PREFILLED_SYRINGE | INTRAVENOUS | Status: DC | PRN
  Administered 2024-02-10: 20 mg via INTRAVENOUS
  Administered 2024-02-10: 50 mg via INTRAVENOUS

## 2024-02-10 MED ORDER — CHLORHEXIDINE GLUCONATE 0.12 % MT SOLN: 15.0000 mL | Freq: Once | OROMUCOSAL | Status: DC

## 2024-02-10 MED ORDER — SUGAMMADEX SODIUM 200 MG/2ML IV SOLN
INTRAVENOUS | Status: DC | PRN
  Administered 2024-02-10: 300 mg via INTRAVENOUS

## 2024-02-10 MED ORDER — FENTANYL CITRATE (PF) 100 MCG/2ML IJ SOLN
INTRAMUSCULAR | Status: DC | PRN
  Administered 2024-02-10 (×2): 50 ug via INTRAVENOUS

## 2024-02-10 NOTE — Anesthesia Preprocedure Evaluation (Signed)
"                                    Anesthesia Evaluation  Patient identified by MRN, date of birth, ID band Patient awake    Reviewed: Allergy & Precautions, H&P , NPO status , Patient's Chart, lab work & pertinent test results  Airway Mallampati: II   Neck ROM: full    Dental   Pulmonary sleep apnea , Current Smoker and Patient abstained from smoking.   breath sounds clear to auscultation       Cardiovascular hypertension, + CAD, + Past MI, +CHF and + DVT  + Valvular Problems/Murmurs  Rhythm:regular Rate:Normal  LVAD in place (HM 3) with DL infection.  S/p MVR and mitra-clip   Neuro/Psych  Neuromuscular disease    GI/Hepatic   Endo/Other    Class 3 obesity  Renal/GU      Musculoskeletal   Abdominal   Peds  Hematology   Anesthesia Other Findings   Reproductive/Obstetrics                              Anesthesia Physical Anesthesia Plan  ASA: 4  Anesthesia Plan: General   Post-op Pain Management:    Induction: Intravenous  PONV Risk Score and Plan: 2 and Ondansetron , Dexamethasone , Treatment may vary due to age or medical condition and Midazolam   Airway Management Planned: Oral ETT  Additional Equipment:   Intra-op Plan:   Post-operative Plan: Extubation in OR  Informed Consent: I have reviewed the patients History and Physical, chart, labs and discussed the procedure including the risks, benefits and alternatives for the proposed anesthesia with the patient or authorized representative who has indicated his/her understanding and acceptance.     Dental advisory given  Plan Discussed with: CRNA, Anesthesiologist and Surgeon  Anesthesia Plan Comments:         Anesthesia Quick Evaluation  "

## 2024-02-10 NOTE — TOC Progression Note (Signed)
 Transition of Care [facility]) - Progression Note    Patient Details  Name: [name] [name] MRN: [id number] Date of Birth: Aug 03, 1975  Transition of [facility]) CM/SW Contact  Marval Gell, RN Phone Number: 02/10/2024, 4:25 PM  Clinical Narrative:     SDOH resources added to AVS                     Expected Discharge Plan and Services                                               Social Drivers of Health (SDOH) Interventions SDOH Screenings   Food Insecurity: No Food Insecurity (02/07/2024)  Housing: Low Risk (02/07/2024)  Transportation Needs: Unmet Transportation Needs (02/07/2024)  Utilities: Not At Risk (02/07/2024)  Alcohol  Screen: Low Risk (12/15/2023)  Depression (PHQ2-9): Low Risk (12/16/2023)  Financial Resource Strain: Low Risk (12/15/2023)  Physical Activity: Insufficiently Active (12/15/2023)  Social Connections: Moderately Isolated (12/15/2023)  Stress: No Stress Concern Present (12/15/2023)  Tobacco Use: High Risk (02/10/2024)    Readmission Risk Interventions    07/03/2022   12:12 PM 02/13/2022   12:41 PM 12/31/2021    1:52 PM  Readmission Risk Prevention Plan  Transportation Screening Complete Complete   HRI or Home Care Consult Complete Complete Complete  Social Work Consult for Recovery Care Planning/Counseling Complete Complete Complete  Palliative Care Screening Not Applicable Not Applicable Not Applicable  Medication Review Oceanographer) Complete Complete Complete

## 2024-02-10 NOTE — Anesthesia Procedure Notes (Addendum)
 Procedure Name: Intubation Date/Time: 02/10/2024 10:13 AM  Performed by: Carolee Lauraine DASEN, CRNAPre-anesthesia Checklist: Patient identified, Emergency Drugs available, Suction available and Patient being monitored Patient Re-evaluated:Patient Re-evaluated prior to induction Oxygen Delivery Method: Circle System Utilized Preoxygenation: Pre-oxygenation with 100% oxygen Induction Type: IV induction Ventilation: Mask ventilation without difficulty Laryngoscope Size: Mac and 3 Grade View: Grade I Tube type: Oral Number of attempts: 1 Airway Equipment and Method: Stylet Placement Confirmation: ETT inserted through vocal cords under direct vision, positive ETCO2 and breath sounds checked- equal and bilateral Secured at: 22 cm Tube secured with: Tape Dental Injury: Teeth and Oropharynx as per pre-operative assessment

## 2024-02-10 NOTE — Discharge Instructions (Signed)

## 2024-02-10 NOTE — Progress Notes (Signed)
 LVAD Coordinator Rounding Note:  Admitted 02/06/24 from VAD clinic for drive line infection to advanced heart failure service  HM III LVAD implanted on 07/24/23 by Dr. Lucas under destination criteria.  Pt met in short stay prior to drive line debridement. Denies complaints.   Vital signs: Temp: 97.6 HR: 59 Doppler Pressure: none documented Automatic BP: 111/80 (91) O2 Sat: 98% on RA Wt: 251.7>250.6  lbs  LVAD interrogation reveals:  Speed: 6000 Flow: 4.4 Power: 4.7 w PI: 3.3  Alarms: none Events:  4 PI events so far today Hematocrit: 35  Fixed speed: 6000 Low speed limit: 5700  Drive Line:  Existing VAD dressing CDI. Anchor secure. Wound care instructions to be updated following debridement this morning. Orders to be updated following procedure.  Labs:  LDH trend: 179>196  INR trend: 1.2>1.1  Anticoagulation Plan: -INR Goal: 2.0-3.0 -ASA Dose: n/a - Coumadin  dosing per pharmacy  Arrythmias:  HX: NSVT on p.o. amiodarone   Infection: 01/04/25>> wound cx >> few eikenella corrodens 02/06/24>> blood cultures >> no growth 3 days  Drips:  Heparin  500 units/hr----stopped prior to debridement 0818  Adverse Events on VAD:   Plan/Recommendations:  1. Page VAD coordinator with any VAD alarms or equipment issues. 2. Daily drive line dressing changes per bedside RN or VAD coordinator 3. VAD coordinator will accompany pt to OR this morning  [name] Lunger RN, BSN VAD Coordinator 24/7 Pager [phone]

## 2024-02-10 NOTE — Progress Notes (Signed)
 "  Advanced Heart Failure VAD Team Note  PCP-Cardiologist: None  Chief Complaint: LVAD infection  Patient Profile   [name] [name] is a 49 y.o. female with LVAD DL infection  Significant events:    Admitted 1/16 for DL infection 8/79/73: S/p OR for DL debiredment  Subjective:    Continues on Unasyn . BC NG X 3 days.   Just returned from OR for DL debridement.  Feels ok just a little pain around surgical site.    LVAD INTERROGATION:  HeartMate 3 VAD Equipment Check Pump Speed (RPM): 6050 RPM Pump Flow (LPM): 4.4 Power (Watts): 5 Watts Pulsatility Index: 3 Fixed Speed Limit: 6000 rpm Low Speed Limit: 5700 rpm Alarms: No alarms Auscultated: Normal expected humming Power Module Self-Test (Daily): Done System Controller Self-Test: Passed Patient Battery Source: [facility] / Wall unit Emergency Equipment at Bedside: Yes  x4 PI events today  Objective:   Vital Signs:   Temp:  [97.6 F (36.4 C)-98.8 F (37.1 C)] 98.1 F (36.7 C) (01/20 1215) Pulse Rate:  [58-74] 63 (01/20 1215) Resp:  [11-20] 17 (01/20 1215) BP: (85-119)/(70-89) 117/88 (01/20 1215) SpO2:  [96 %-100 %] 98 % (01/20 1215) Weight:  [113.7 kg] 113.7 kg (01/20 0812) Last BM Date : 02/06/24 Mean arterial Pressure 80s-90s  Intake/Output:   Intake/Output Summary (Last 24 hours) at 02/10/2024 1231 Last data filed at 02/10/2024 1100 Gross per 24 hour  Intake 925.13 ml  Output 1510 ml  Net -584.87 ml    Physical Exam  General:  Well appearing. No resp difficulty Neck:  JVP ~7.  Cor: Mechanical heart sounds with LVAD hum present. Lungs: Clear Driveline: C/D/I; securement device intact and driveline incorporated Extremities: no edema Neuro: alert & oriented x3. Affect pleasant   Telemetry   NSR 60s (Personally reviewed)    Labs  Basic Metabolic Panel: Recent Labs  Lab 02/06/24 1857 02/07/24 0148 02/08/24 0201 02/09/24 0111 02/10/24 0217  NA 134* 133* 135 135 137  K 4.7 4.5 4.6 4.8 4.6  CL 96* 97*  95* 96* 98  CO2 32 29 33* 32 33*  GLUCOSE 77 82 68* 80 63*  BUN 27* 28* 27* 38* 42*  CREATININE 1.13* 1.17* 1.14* 1.28* 1.43*  CALCIUM  9.4 9.0 9.1 9.2 9.4    Liver Function Tests: No results for input(s): AST, ALT, ALKPHOS, BILITOT, PROT, ALBUMIN  in the last 168 hours. No results for input(s): LIPASE, AMYLASE in the last 168 hours. No results for input(s): AMMONIA in the last 168 hours.  CBC: Recent Labs  Lab 02/06/24 1857 02/07/24 0148 02/08/24 0201 02/09/24 0111 02/10/24 0217  WBC 4.1 3.8* 3.0* 3.4* 3.5*  HGB 12.2 11.4* 12.1 11.4* 11.5*  HCT 38.2 34.3* 37.0 35.2* 35.4*  MCV 92.7 91.0 91.1 91.9 91.2  PLT 130* 111* 123* 119* 120*    INR: Recent Labs  Lab 02/06/24 1857 02/07/24 0148 02/08/24 0201 02/09/24 0111 02/10/24 0217  INR 1.8* 1.8* 1.4* 1.2 1.1    Medications:    Scheduled Medications:  [MAR Hold] atorvastatin   20 mg Oral q1800   chlorhexidine   15 mL Mouth/Throat Once   Or   mouth rinse  15 mL Mouth Rinse Once   [MAR Hold] digoxin   0.125 mg Oral Daily   [MAR Hold] gabapentin   300 mg Oral BID   [MAR Hold] losartan   50 mg Oral Daily   [MAR Hold] pantoprazole   40 mg Oral Daily   [MAR Hold] spironolactone   25 mg Oral Daily   [MAR Hold] torsemide   40 mg Oral Daily    Infusions:  [MAR Hold] ampicillin -sulbactam (UNASYN ) IV 3 g (02/10/24 0408)   heparin  Stopped (02/10/24 0818)   lactated ringers  400 mL/hr at 02/10/24 1120    PRN Medications: [MAR Hold] acetaminophen , [MAR Hold] albuterol , fentaNYL  (SUBLIMAZE ) injection, [MAR Hold] lip balm, [MAR Hold] ondansetron  (ZOFRAN ) IV, ondansetron  (ZOFRAN ) IV, oxyCODONE  **OR** oxyCODONE , [MAR Hold] oxyCODONE -acetaminophen , [MAR Hold] traMADol   Assessment/Plan:    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  Related to poor outpatient dressing care.   - CT abdomen pelvis with contrast - unremarkable except for R breast mass (see below) - Continue IV  unasyn . BC NGTD. Afebrile. - S/p post I&D with Dr. Daniel this morning.   Chronic systolic HF with biventricular dysfunction/stage D cardiomyopathy s/p HM3 07/24/2023 with concurrent MVR - Continue speed at 6000 RPM, stable - Stable NYHA class II - VAD MAPs stable. Continue losartan  50 mg daily -Continue torsemide  40mg  daily. -Continue digoxin  0.125 mg daily, last dig level <0.6 -Continue spironolactone  25mg  daily - Holding warfarin, plan to restart tomorrow evening - INR 1.1. Hep gtt on hold until tomorrow morning.  Discussed with PharmD.    Severe MR s/p MVR - Echo 4/25 with severe MR  - TEER on 07/09/23 complicated by worsening MR - S/p MVR   Hx of PE/DVT w/ IVC filter - Evaluated at [facility] where thrombophilia work-up was negative (Lupus inhibitor, anticardiolipin antibodies, and anti beta-2  glycoprotein antibodies were negative; protein C activity 85%, protein S activity 76%, factor V Leiden, and prothrombin gene mutation ) - IVC filter in place; could not be removed due to significant thrombus burden on filter at [facility].  - Holding warfarin as above - Heparin  to restart tomorrow morning.    Obesity  - BMI significantly improved, will need GLP-1 later if considering txp - Body mass index is 35.97 kg/m.   CAD - LHC in 5/23 w/ nonobstructive CAD involving RCA - Inferior WMA on most recent echo - no s/s angina   Tobacco use - understands need to quit   Right breast mass on CT - malignancy vs infection - will need mammogram - Has had R breast biopsy in 2022 which was benign    I reviewed the LVAD parameters from today, and compared the results to the patient's prior recorded data.  No programming changes were made.  The LVAD is functioning within specified parameters.  The patient performs LVAD self-test daily.  LVAD interrogation was negative for any significant power changes, alarms or PI events/speed drops.  LVAD equipment check completed and is in good working order.  Back-up  equipment present.   LVAD education done on emergency procedures and precautions and reviewed exit site care.  Length of Stay: 4  Beckey LITTIE Coe, NP 02/10/2024, 12:31 PM  VAD Team --- VAD ISSUES ONLY--- Pager [phone] (7am - 7am)  Advanced Heart Failure Team  Pager [phone] (M-F; 7a - 5p)   Please visit [email or web address]: For overnight coverage please call cardiology fellow first. If fellow not available call Shock/ECMO MD on call.  For ECMO / Mechanical Support (Impella, IABP, LVAD) issues call Shock / ECMO MD on call.    "

## 2024-02-10 NOTE — Progress Notes (Signed)
 VAD Coordinator Procedure Note:   VAD Coordinator met patient in short stay. Pt undergoing drive line debridement  per Dr. Daniel. Hemodynamics and VAD parameters monitored by myself and anesthesia throughout the procedure. Blood pressures were obtained with automatic cuff on left arm.   Time: Doppler Auto  BP Flow PI Power Speed  Pre-procedure:  0800  101/70(80) 4.5 3.2 4.8w 6000   1000  106/84(91) 4.4 3.3 4.7w 6000           Sedation Induction: 1010  104/86(94) 4.5 3.4 4.8w 6000   1015  104/57(68) 4.4 3.2 4.8w 6000   1030  100/83(90) 4.6 2.1 4.8w 6000   1045  84/54(65) 4.3 3.0 4.8w 6000   1100  101/75(85) 4.5 2.5 4.7w 6000   1115  100/82(90) 4.8 2.7 4.7w 6000   1130  107/90(97 3.9 5.2 4.7w 6000  Recovery Area: 1152  119/89(98) 4.3 3.2 4.9w 6000   1200  118/83(94) 4.4 3.3 4.8w 6000   1215  117/88(97) 4.3 3.3 4.8w 6000    Patient tolerated the procedure well. VAD Coordinator accompanied and remained with patient in recovery area.   Per Dr. Daniel HOLD anticoagulation tonight. May restart heparin  gtt tomorrow morning. If drive line dressing looking good with dressing change tomorrow may restart Coumadin  tomorrow evening. AHF pharmacist made aware.  Patient Disposition: 2C14. Bedside report given to [facility].  [name] Lunger RN, BSN VAD Coordinator 24/7 Pager [phone]

## 2024-02-10 NOTE — Transfer of Care (Signed)
 Immediate Anesthesia Transfer of Care Note  Patient: [name] [name]  Procedure(s) Performed: DEBRIDEMENT, WOUND, STERNUM  Patient Location: PACU  Anesthesia Type:General  Level of Consciousness: awake and drowsy  Airway & Oxygen Therapy: Patient Spontanous Breathing and Patient connected to nasal cannula oxygen  Post-op Assessment: Report given to RN, Post -op Vital signs reviewed and stable, and Patient moving all extremities X 4  Post vital signs: Reviewed and stable  Last Vitals:  Vitals Value Taken Time  BP 119/89 02/10/24 11:48  Temp    Pulse 63 02/10/24 11:51  Resp 13 02/10/24 11:51  SpO2 100 % 02/10/24 11:51  Vitals shown include unfiled device data.  Last Pain:  Vitals:   02/10/24 0831  TempSrc:   PainSc: 0-No pain      Patients Stated Pain Goal: 2 (02/08/24 0014)  Complications: There were no known notable events for this encounter.

## 2024-02-10 NOTE — Progress Notes (Addendum)
 PHARMACY - ANTICOAGULATION CONSULT NOTE  Pharmacy Consult for warfarin Indication: LVAD HM3 with hx PE/DVT  Allergies[1]  Patient Measurements: Height: 5' 10 (177.8 cm) Weight: 113.7 kg (250 lb 10.6 oz) IBW/kg (Calculated) : 68.5 HEPARIN  DW (KG): 94  Vital Signs: Temp: 98 F (36.7 C) (01/20 1245) Temp Source: Oral (01/20 1245) BP: 115/87 (01/20 1245) Pulse Rate: 63 (01/20 1245)  Labs: Recent Labs    02/08/24 0201 02/09/24 0111 02/10/24 0217  HGB 12.1 11.4* 11.5*  HCT 37.0 35.2* 35.4*  PLT 123* 119* 120*  APTT  --   --  33  LABPROT 18.2* 16.4* 14.9  INR 1.4* 1.2 1.1  HEPARINUNFRC <0.10* <0.10* <0.10*  CREATININE 1.14* 1.28* 1.43*    Estimated Creatinine Clearance: 66.5 mL/min (A) (by C-G formula based on SCr of 1.43 mg/dL (H)).   Medical History: Past Medical History:  Diagnosis Date   Anemia    Blood transfusion without reported diagnosis    CAD (coronary artery disease)    Nonobstructive   Closed left ankle fracture    August 30 2012   Clotting disorder    DVT (deep venous thrombosis) (HCC)    L leg   Fibroid tumor    HFrEF (heart failure with reduced ejection fraction) (HCC)    Hidradenitis    Hypertension    LVAD (left ventricular assist device) present [facility])    Mitral regurgitation    Myocardial infarction (HCC)    NICM (nonischemic cardiomyopathy) (HCC)    NSVT (nonsustained ventricular tachycardia) (HCC)    Obesity    OSA on CPAP    Pulmonary embolism (HCC) 04/2013   Sleep apnea     Medications:  Scheduled:   atorvastatin   20 mg Oral q1800   digoxin   0.125 mg Oral Daily   gabapentin   300 mg Oral BID   losartan   50 mg Oral Daily   pantoprazole   40 mg Oral Daily   spironolactone   25 mg Oral Daily   torsemide   40 mg Oral Daily    Assessment: 71 yof presenting with concern for driveline infection - plan to admit for IV antibiotics and evaluation for possible I&D. On warfarin PTA for LVAD + hx DVT/PE.   PTA warfarin regimen is  5 mg  daily except 7.5 mg Tues/Sat at last clinic appointment.  Heparin  level this morning came low as expected (at goal), on 500 units/hr. INR today is low at 1.1. Hgb 11.5, plt 120, LDH 196. No s/sx of bleeding or infusion issues.   Per discussion with AHF MD, plan to continue low dose heparin  infusion while INR remains <1.5.   Underwent I&D today.   Goal of Therapy:  INR 2-3 Heparin  level <0.3 - fixed rate unless directed by MD Monitor platelets by anticoagulation protocol: Yes   Plan:  Hold warfarin  Hold heparin  infusion tonight - restart heparin  infusion at 500 units/hr fixed rate, no titration on 1/21 at 0600 Monitor daily INR, CBC, and for s/sx of bleeding  Thank you for allowing pharmacy to participate in this patient's care,  Suzen Sour, PharmD, BCCCP Clinical Pharmacist  Phone: [phone] 02/10/2024 2:26 PM  Please check AMION for all [facility] Pharmacy phone numbers After 10:00 PM, call Main Pharmacy [phone]     [1]  Allergies Allergen Reactions   Sulfa  Antibiotics Itching, Swelling and Rash    Lip swelling

## 2024-02-10 NOTE — Anesthesia Postprocedure Evaluation (Signed)
"   Anesthesia Post Note  Patient: [name] [name]  Procedure(s) Performed: DEBRIDEMENT, WOUND, STERNUM     Patient location during evaluation: PACU Anesthesia Type: General Level of consciousness: awake and alert Pain management: pain level controlled Vital Signs Assessment: post-procedure vital signs reviewed and stable Respiratory status: spontaneous breathing, nonlabored ventilation, respiratory function stable and patient connected to nasal cannula oxygen Cardiovascular status: blood pressure returned to baseline and stable Postop Assessment: no apparent nausea or vomiting Anesthetic complications: no   There were no known notable events for this encounter.  Last Vitals:  Vitals:   02/10/24 1215 02/10/24 1245  BP: 117/88 115/87  Pulse: 63 63  Resp: 17 18  Temp: 36.7 C 36.7 C  SpO2: 98% 100%    Last Pain:  Vitals:   02/10/24 1245  TempSrc: Oral  PainSc: 8                  [name] S      "

## 2024-02-10 NOTE — Progress Notes (Signed)
 Brief Progress Note  No acute events. Remains afebrile.  INR 1.1  Vitals:   02/10/24 0004 02/10/24 0408  BP: (!) 85/74 103/80  Pulse:    Resp: 20 16  Temp: 97.9 F (36.6 C) 97.6 F (36.4 C)  SpO2: 98% 98%   OR today for driveline debridement.  Con Clunes, MD Cardiothoracic Surgery Pager: [phone]

## 2024-02-10 NOTE — Op Note (Signed)
 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 Messenger is a very pleasant 49 year old woman who underwent HeartMate 3 implantation in July 2025.  She had initially done well from a cardiac standpoint, however she does note that there were a couple instances in which she got her driveline caught on a shopping cart or something at home, and after that she subsequently started developing drainage around the driveline site.  This was in December 2025, and she has been treated as an outpatient with oral antibiotics.  She continues to have drainage from the driveline site despite these antibiotics ,  The operative procedure has been discussed with the patient including alternatives, benefits and risks; including but not limited to bleeding and recurrent infection The patient understands and agrees to proceed.      Preparation:  The patient was seen in the preoperative holding area and the correct patient, correct operation were confirmed with the patient after reviewing the medical record. The consent was signed by me. Preoperative antibiotics were not given since he was already on antibiotics.  The patient was taken back to the operating room with the LVAD coordinator and positioned supine on the operating room table. After being placed under general endotracheal anesthesia by the anesthesia team the chest and abdomen were prepped with betadine soap and solution and draped in the usual sterile manner. A surgical time-out was taken and the correct patient and operative procedure were confirmed with the nursing and anesthesia staff. The LVAD coordinator was present throughout  to monitor the LVAD and assist anesthesia.  Excisional debridement of drive line tunnel:  The skin around the drive line exit site was incised in a elliptical fashion and continued down through the subcutaneous tissue using electrocautery to completely excise the drive line tunnel tissue. The apex of the elliptical incision was extended over the drive line until incorporated drive line was encountered. The length of the tunnel opened was 5 cm. All of the drive line  tunnel tissue was excised back to the line of incorporation. The excised tissue was cut into small pieces and place in the culture tubes and sent to micro. A culture swab was also sent to micro.  The wound was mechanically debrided using the pulse lavage and 1 liter of VASCHE solution. Hemostasis was obtained. The drive line was placed against the apex of the wound which will be the new exit site. The skin was approximated with vertical mattress 2-0 nylon sutures.  A damp VASCHE soaked Kerlix was placed in the wound and this took about 1/2 of a kerlix roll. A dry sterile pressure dressing was applied over this and around the drive line.   All sponge, needle, and instrument counts were reported correct at the end of the case.  The patient was then transported to the PACU in satisfactory condition.

## 2024-02-10 NOTE — Plan of Care (Signed)

## 2024-02-10 NOTE — Plan of Care (Signed)
" °  Problem: Education: Goal: Patient will understand all VAD equipment and how it functions Outcome: Progressing   Problem: Cardiac: Goal: LVAD will function as expected and patient will experience no clinical alarms Outcome: Progressing   Problem: Clinical Measurements: Goal: Diagnostic test results will improve Outcome: Progressing Goal: Respiratory complications will improve Outcome: Progressing Goal: Cardiovascular complication will be avoided Outcome: Progressing   Problem: Pain Managment: Goal: General experience of comfort will improve and/or be controlled Outcome: Progressing   "

## 2024-02-11 ENCOUNTER — Encounter (HOSPITAL_COMMUNITY): Payer: Self-pay

## 2024-02-11 ENCOUNTER — Ambulatory Visit (HOSPITAL_COMMUNITY)

## 2024-02-11 DIAGNOSIS — T827XXS Infection and inflammatory reaction due to other cardiac and vascular devices, implants and grafts, sequela: Secondary | ICD-10-CM | POA: Diagnosis not present

## 2024-02-11 DIAGNOSIS — Z5181 Encounter for therapeutic drug level monitoring: Secondary | ICD-10-CM

## 2024-02-11 DIAGNOSIS — Z95811 Presence of heart assist device: Secondary | ICD-10-CM

## 2024-02-11 DIAGNOSIS — A498 Other bacterial infections of unspecified site: Secondary | ICD-10-CM

## 2024-02-11 DIAGNOSIS — F17210 Nicotine dependence, cigarettes, uncomplicated: Secondary | ICD-10-CM | POA: Diagnosis not present

## 2024-02-11 DIAGNOSIS — B964 Proteus (mirabilis) (morganii) as the cause of diseases classified elsewhere: Secondary | ICD-10-CM | POA: Diagnosis not present

## 2024-02-11 DIAGNOSIS — T827XXA Infection and inflammatory reaction due to other cardiac and vascular devices, implants and grafts, initial encounter: Secondary | ICD-10-CM | POA: Diagnosis not present

## 2024-02-11 DIAGNOSIS — I509 Heart failure, unspecified: Secondary | ICD-10-CM | POA: Diagnosis not present

## 2024-02-11 DIAGNOSIS — I38 Endocarditis, valve unspecified: Secondary | ICD-10-CM | POA: Diagnosis not present

## 2024-02-11 DIAGNOSIS — B9689 Other specified bacterial agents as the cause of diseases classified elsewhere: Secondary | ICD-10-CM | POA: Diagnosis not present

## 2024-02-11 LAB — PROTIME-INR
INR: 1.1 (ref 0.8–1.2)
Prothrombin Time: 14.7 s (ref 11.4–15.2)

## 2024-02-11 LAB — CBC
HCT: 37.2 % (ref 36.0–46.0)
Hemoglobin: 12 g/dL (ref 12.0–15.0)
MCH: 29.8 pg (ref 26.0–34.0)
MCHC: 32.3 g/dL (ref 30.0–36.0)
MCV: 92.3 fL (ref 80.0–100.0)
Platelets: 145 K/uL — ABNORMAL LOW (ref 150–400)
RBC: 4.03 MIL/uL (ref 3.87–5.11)
RDW: 15.3 % (ref 11.5–15.5)
WBC: 6.5 K/uL (ref 4.0–10.5)
nRBC: 0 % (ref 0.0–0.2)

## 2024-02-11 LAB — CULTURE, BLOOD (ROUTINE X 2)
Culture: NO GROWTH
Culture: NO GROWTH
Special Requests: ADEQUATE
Special Requests: ADEQUATE

## 2024-02-11 LAB — GLUCOSE, CAPILLARY: Glucose-Capillary: 111 mg/dL — ABNORMAL HIGH (ref 70–99)

## 2024-02-11 LAB — BASIC METABOLIC PANEL WITH GFR
Anion gap: 7 (ref 5–15)
BUN: 43 mg/dL — ABNORMAL HIGH (ref 6–20)
CO2: 32 mmol/L (ref 22–32)
Calcium: 9.5 mg/dL (ref 8.9–10.3)
Chloride: 96 mmol/L — ABNORMAL LOW (ref 98–111)
Creatinine, Ser: 1.39 mg/dL — ABNORMAL HIGH (ref 0.44–1.00)
GFR, Estimated: 47 mL/min — ABNORMAL LOW
Glucose, Bld: 108 mg/dL — ABNORMAL HIGH (ref 70–99)
Potassium: 4.7 mmol/L (ref 3.5–5.1)
Sodium: 135 mmol/L (ref 135–145)

## 2024-02-11 LAB — LACTATE DEHYDROGENASE: LDH: 181 U/L (ref 105–235)

## 2024-02-11 NOTE — Progress Notes (Signed)
 "  Advanced Heart Failure VAD Team Note  PCP-Cardiologist: None  Chief Complaint: LVAD infection  Patient Profile   [name] [name] is a 49 y.o. female with hx of saddle PE/DVT s/p IVC filter (2015), HFrEF/NICM, pancytopenia, nonobstructive CAD, morbid obesity, noncompliance, pancytopenia (followed by hematology), tobacco use. Admitted with LVAD DL infection  Significant events:    Admitted 1/16 for DL infection 8/79/73: S/p OR for DL debiredment  Subjective:    Continues on Unasyn . BC NGTD.   Wound cx pending.   Feels good this morning, just some abd tenderness. Has only been getting up in the room today.   LVAD INTERROGATION:  HeartMate 3 VAD Equipment Check Pump Speed (RPM): 6000 RPM Pump Flow (LPM): 4.4 Power (Watts): 5 Watts Pulsatility Index: 3.1 Fixed Speed Limit: 6000 rpm Low Speed Limit: 5700 rpm Alarms: No alarms Auscultated: Normal expected humming Power Module Self-Test (Daily): Done System Controller Self-Test: Passed Patient Battery Source: [facility] / Wall unit Emergency Equipment at Bedside: Yes  X5 PI events today  Objective:   Vital Signs:   Temp:  [97.8 F (36.6 C)-98.6 F (37 C)] 97.8 F (36.6 C) (01/21 1122) Pulse Rate:  [63-72] 67 (01/21 1122) Resp:  [11-20] 18 (01/21 1122) BP: (76-119)/(38-89) 99/86 (01/21 1122) SpO2:  [96 %-100 %] 98 % (01/21 1122) Weight:  [112.4 kg] 112.4 kg (01/21 0647) Last BM Date : 02/10/24 Mean arterial Pressure 80s-90s  Intake/Output:   Intake/Output Summary (Last 24 hours) at 02/11/2024 1136 Last data filed at 02/11/2024 0810 Gross per 24 hour  Intake 680 ml  Output 3300 ml  Net -2620 ml    Physical Exam  General:  Well appearing. No resp difficulty Neck:  JVP ~7.  Cor: Mechanical heart sounds with LVAD hum present. Lungs: Clear Driveline: C/D/I; securement device intact and driveline incorporated Extremities: no edema Neuro: alert & oriented x3. Affect pleasant   Telemetry   NSR 60s (Personally reviewed)     Labs  Basic Metabolic Panel: Recent Labs  Lab 02/07/24 0148 02/08/24 0201 02/09/24 0111 02/10/24 0217 02/11/24 0220  NA 133* 135 135 137 135  K 4.5 4.6 4.8 4.6 4.7  CL 97* 95* 96* 98 96*  CO2 29 33* 32 33* 32  GLUCOSE 82 68* 80 63* 108*  BUN 28* 27* 38* 42* 43*  CREATININE 1.17* 1.14* 1.28* 1.43* 1.39*  CALCIUM  9.0 9.1 9.2 9.4 9.5    Liver Function Tests: No results for input(s): AST, ALT, ALKPHOS, BILITOT, PROT, ALBUMIN  in the last 168 hours. No results for input(s): LIPASE, AMYLASE in the last 168 hours. No results for input(s): AMMONIA in the last 168 hours.  CBC: Recent Labs  Lab 02/07/24 0148 02/08/24 0201 02/09/24 0111 02/10/24 0217 02/11/24 0220  WBC 3.8* 3.0* 3.4* 3.5* 6.5  HGB 11.4* 12.1 11.4* 11.5* 12.0  HCT 34.3* 37.0 35.2* 35.4* 37.2  MCV 91.0 91.1 91.9 91.2 92.3  PLT 111* 123* 119* 120* 145*    INR: Recent Labs  Lab 02/07/24 0148 02/08/24 0201 02/09/24 0111 02/10/24 0217 02/11/24 0220  INR 1.8* 1.4* 1.2 1.1 1.1    Medications:    Scheduled Medications:  atorvastatin   20 mg Oral q1800   digoxin   0.125 mg Oral Daily   gabapentin   300 mg Oral BID   losartan   50 mg Oral Daily   pantoprazole   40 mg Oral Daily   spironolactone   25 mg Oral Daily   torsemide   40 mg Oral Daily    Infusions:  ampicillin -sulbactam (UNASYN )  IV 3 g (02/11/24 0928)   heparin  500 Units/hr (02/11/24 0647)    PRN Medications: acetaminophen , albuterol , HYDROmorphone  (DILAUDID ) injection, lip balm, ondansetron  (ZOFRAN ) IV, oxyCODONE -acetaminophen , traMADol   Assessment/Plan:    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  Related to poor outpatient dressing care.   - CT abdomen pelvis with contrast - unremarkable except for R breast mass (see below) - Continue IV unasyn . BC NGTD. Afebrile. - S/p post I&D with Dr. Daniel 1/20.   Chronic systolic HF with biventricular dysfunction/stage D cardiomyopathy  s/p HM3 07/24/2023 with concurrent MVR - Continue speed at 6000 RPM, stable - Stable NYHA class II - VAD MAPs stable. Continue losartan  50 mg daily - Continue torsemide  40mg  daily. - Continue digoxin  0.125 mg daily, last dig level <0.6 - Continue spironolactone  25mg  daily - Dressing change this morning with some oozing. Hold PM warfarin per TCTS.  - INR 1.1. Back on hep gtt.  Discussed with PharmD.    Severe MR s/p MVR - Echo 4/25 with severe MR  - TEER on 07/09/23 complicated by worsening MR - S/p MVR   Hx of PE/DVT w/ IVC filter - Evaluated at [facility] where thrombophilia work-up was negative (Lupus inhibitor, anticardiolipin antibodies, and anti beta-2  glycoprotein antibodies were negative; protein C activity 85%, protein S activity 76%, factor V Leiden, and prothrombin gene mutation ) - IVC filter in place; could not be removed due to significant thrombus burden on filter at [facility].  - Holding warfarin as above - Continue hep gtt.    Obesity  - BMI significantly improved, will need GLP-1 later if considering txp - Body mass index is 35.56 kg/m.   CAD - LHC in 5/23 w/ nonobstructive CAD involving RCA - Inferior WMA on most recent echo - no s/s angina   Tobacco use - understands need to quit   Right breast mass on CT - malignancy vs infection - will need mammogram - Has had R breast biopsy in 2022 which was benign    I reviewed the LVAD parameters from today, and compared the results to the patient's prior recorded data.  No programming changes were made.  The LVAD is functioning within specified parameters.  The patient performs LVAD self-test daily.  LVAD interrogation was negative for any significant power changes, alarms or PI events/speed drops.  LVAD equipment check completed and is in good working order.  Back-up equipment present.   LVAD education done on emergency procedures and precautions and reviewed exit site care.  Length of Stay: 5  Beckey LITTIE Coe, NP 02/11/2024, 11:36  AM  VAD Team --- VAD ISSUES ONLY--- Pager [phone] (7am - 7am)  Advanced Heart Failure Team  Pager [phone] (M-F; 7a - 5p)   Please visit [email or web address]: For overnight coverage please call cardiology fellow first. If fellow not available call Shock/ECMO MD on call.  For ECMO / Mechanical Support (Impella, IABP, LVAD) issues call Shock / ECMO MD on call.    "

## 2024-02-11 NOTE — Plan of Care (Signed)
" °  Problem: Cardiac: Goal: LVAD will function as expected and patient will experience no clinical alarms Outcome: Progressing   Problem: Clinical Measurements: Goal: Respiratory complications will improve Outcome: Progressing Goal: Cardiovascular complication will be avoided Outcome: Progressing   Problem: Activity: Goal: Risk for activity intolerance will decrease Outcome: Progressing   Problem: Nutrition: Goal: Adequate nutrition will be maintained Outcome: Progressing   Problem: Coping: Goal: Level of anxiety will decrease Outcome: Progressing   "

## 2024-02-11 NOTE — Progress Notes (Signed)
 Brief Progress Note  POD1 s/p LVAD driveline debrdiement.  Doing well this morning. Has some pain but it's manageable.  Remains afebrile, cultures with rare GPC on gram stain.  Dressing is CDI, no staining on the dressing.  Plan: - Heparin  drip to start this morning - Dressing change today with VAD coordinator, if wound looks hemostatic, can uptitrate heparin  gtt (no boluses) and resume coumadin  tonight.  Con Clunes, MD Cardiothoracic Surgery Pager: [phone]

## 2024-02-11 NOTE — Progress Notes (Signed)
 "        [facility]  Date of Admission:  02/06/2024      Total days of antibiotics 5   Unasyn           ASSESSMENT: [name] [name] is a 49 y.o. 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 She is well appearing today and tolerating antibiotics fine without any side effects.  3 operative specimens reviewed: Proteus Mirabilis growing x 3 sites with one more re-incubating for better growth. One plate that was previously labeled gram positive cocci (will need to call micro to clarify if nothing else reported). Hopefully with surgical control we can achieve meaningful suppression of this infection for her. We discussed these findings today.  - Continue unasyn  - based on microbiogram we have good confidence the proteus will be susceptible.  - BCx 1/16 without growth final  - Will follow cultures through to maturity.  - FU for any changes to operative plans    Vascular Access -  -PICC discussed briefly but she was on a phone call. Will go into better detail tomorrow for consent.    Discharge Planning / Coordination of Care -  -Outpatient antibiotics PENDING FINAL CULTURES  -Discussed with ID pharmacy and primary    Medication Management -  Creatinine stable - no antibiotic dose changes recommended  - CRP / ESR in AM for monitoring intentions  - follow weekly CBC, CMP - she is on warfarin at baseline - will need review with abx choice for any possible INR interactions   Isolation recommendations -  Standard / universal    PLAN: - Continue unasyn  - based on microbiogram we have good confidence the proteus will be susceptible. Will need a simpler home regimen for OPAT - Will follow cultures through to maturity.  - FU for any changes to operative plans  - PICC  discussed briefly but she was on a phone call. Will go into better detail tomorrow for consent.  - CRP / ESR in AM for monitoring intentions   Principal Problem:   Infection associated with driveline of left ventricular assist device (LVAD) Active Problems:   Nonadherence to medical treatment   Valvular heart disease   Acute on chronic congestive heart failure (HCC)    atorvastatin   20 mg Oral q1800   digoxin   0.125 mg Oral Daily   gabapentin   300 mg Oral BID   losartan   50 mg Oral Daily   pantoprazole   40 mg Oral Daily   spironolactone   25 mg Oral Daily   torsemide   40 mg Oral Daily    SUBJECTIVE: Doing well w/o complaint today. Tolerating abx well w/o concerns.  She is eating lunch in her room. Had to take a phone call   Review of Systems: Review of Systems  Constitutional:  Negative for chills and fever.  HENT:  Negative for tinnitus.   Eyes:  Negative for blurred vision and photophobia.  Respiratory:  Negative for cough and sputum production.   Cardiovascular:  Negative for chest pain.  Gastrointestinal:  Negative for diarrhea, nausea and vomiting.  Genitourinary:  Negative for dysuria.  Skin:  Negative for rash.  Neurological:  Negative for headaches.    Allergies[1]  OBJECTIVE: Vitals:   02/11/24 0338 02/11/24 0647 02/11/24 0807 02/11/24 1122  BP: 97/81  101/88 99/86  Pulse:   66 67  Resp: 20  17 18   Temp: 98.6 F (37 C)  98.2 F (36.8 C) 97.8 F (36.6 C)  TempSrc: Oral  Oral Oral  SpO2:   98% 98%  Weight:  112.4 kg    Height:       Body mass index is 35.56 kg/m.  Physical Exam Constitutional:      Appearance: Normal appearance. She is not ill-appearing.  HENT:     Mouth/Throat:     Mouth: Mucous membranes are moist.     Pharynx: Oropharynx is clear.  Eyes:     General: No scleral icterus. Cardiovascular:     Rate and Rhythm: Normal rate.  Pulmonary:     Effort: Pulmonary effort is normal.  Skin:    General: Skin is warm and dry.      Findings: No rash.  Neurological:     Mental Status: She is oriented to person, place, and time.  Psychiatric:        Mood and Affect: Mood normal.        Thought Content: Thought content normal.     Lab Results Lab Results  Component Value Date   WBC 6.5 02/11/2024   HGB 12.0 02/11/2024   HCT 37.2 02/11/2024   MCV 92.3 02/11/2024   PLT 145 (L) 02/11/2024    Lab Results  Component Value Date   CREATININE 1.39 (H) 02/11/2024   BUN 43 (H) 02/11/2024   NA 135 02/11/2024   K 4.7 02/11/2024   CL 96 (L) 02/11/2024   CO2 32 02/11/2024    Lab Results  Component Value Date   ALT 12 01/05/2024   AST 24 01/05/2024   ALKPHOS 83 01/05/2024   BILITOT 0.8 01/05/2024     Microbiology: Recent Results (from the past 240 hours)  Culture, blood (Routine X 2) w Reflex to ID Panel     Status: None   Collection Time: 02/06/24 11:48 AM   Specimen: BLOOD  Result Value Ref Range Status   Specimen Description BLOOD BLOOD RIGHT HAND  Final   Special Requests   Final    BOTTLES DRAWN AEROBIC AND ANAEROBIC Blood Culture adequate volume   Culture   Final    NO GROWTH 5 DAYS Performed at [facility], [address], [place], [place] [address]    Report Status 02/11/2024 FINAL  Final  Culture, blood (Routine X 2) w Reflex to ID Panel     Status: None   Collection Time: 02/06/24 12:00 PM   Specimen: BLOOD  Result Value Ref Range Status   Specimen Description BLOOD LEFT ANTECUBITAL  Final   Special Requests   Final    BOTTLES DRAWN AEROBIC AND ANAEROBIC Blood Culture adequate volume   Culture   Final    NO GROWTH 5 DAYS Performed at [facility], [address] [place], [place] [address]    Report Status 02/11/2024 FINAL  Final  MRSA Next Gen by PCR, Nasal     Status: None   Collection Time: 02/06/24  4:07 PM   Specimen: Nasal Mucosa; Nasal Swab  Result Value Ref Range Status   MRSA by PCR Next Gen NOT DETECTED NOT DETECTED Final    Comment: (NOTE) The GeneXpert MRSA  Assay (FDA approved for NASAL specimens only), is one component of a comprehensive MRSA colonization surveillance program. It is not intended to diagnose MRSA infection nor to guide or monitor treatment for MRSA infections. Test  performance is not FDA approved in patients less than 96 years old. Performed at [facility], [address] [place], [place] [address]   Surgical pcr screen     Status: None   Collection Time: 02/10/24  4:47 AM   Specimen: Nasal Mucosa; Nasal Swab  Result Value Ref Range Status   MRSA, PCR NEGATIVE NEGATIVE Final   Staphylococcus aureus NEGATIVE NEGATIVE Final    Comment: (NOTE) The Xpert SA Assay (FDA approved for NASAL specimens in patients 33 years of age and older), is one component of a comprehensive surveillance program. It is not intended to diagnose infection nor to guide or monitor treatment. Performed at [facility], [address], [place], [place] [address]   Aerobic/Anaerobic Culture w Gram Stain (surgical/deep wound)     Status: None (Preliminary result)   Collection Time: 02/10/24 10:47 AM   Specimen: Abdomen; Wound  Result Value Ref Range Status   Specimen Description ABDOMEN  Final   Special Requests DRIVE LINE INFECTION  Final   Gram Stain   Final    FEW WBC PRESENT, PREDOMINANTLY PMN NO ORGANISMS SEEN Performed at [facility], [address] [place], [place] [address]    Culture RARE PROTEUS MIRABILIS  Final   Report Status PENDING  Incomplete  Aerobic/Anaerobic Culture w Gram Stain (surgical/deep wound)     Status: None (Preliminary result)   Collection Time: 02/10/24 10:51 AM   Specimen: Abdomen; Wound  Result Value Ref Range Status   Specimen Description ABDOMEN  Final   Special Requests DRIVE LINE TISSUE  Final   Gram Stain   Final    FEW WBC PRESENT, PREDOMINANTLY PMN RARE GRAM POSITIVE COCCI Performed at [facility], [address], [place], [place] [address]    Culture RARE PROTEUS  MIRABILIS  Final   Report Status PENDING  Incomplete  Aerobic/Anaerobic Culture w Gram Stain (surgical/deep wound)     Status: None (Preliminary result)   Collection Time: 02/10/24 10:58 AM   Specimen: Abdomen; Wound  Result Value Ref Range Status   Specimen Description ABDOMEN  Final   Special Requests DRIVE LINE VELOUR  Final   Gram Stain RARE WBC SEEN NO ORGANISMS SEEN   Final   Culture   Final    FEW PROTEUS MIRABILIS SUSCEPTIBILITIES TO FOLLOW CULTURE REINCUBATED FOR BETTER GROWTH Performed at [facility], [address] [place], [place] [address]    Report Status PENDING  Incomplete    Corean Fireman, MSN, NP-C [facility]  [email or web address] Pager: [phone] Office: [phone] [facility] Main Line: [phone] *Secure Chat Communication Welcome      [1]  Allergies Allergen Reactions   Sulfa  Antibiotics Itching, Swelling and Rash    Lip swelling   "

## 2024-02-11 NOTE — TOC Initial Note (Signed)
 Transition of Care [facility]) - Initial/Assessment Note    Patient Details  Name: [name] [name] MRN: [id number] Date of Birth: 1975-05-25  Transition of Care [facility]) CM/SW Contact:    [name] Phone Number: [phone] 02/11/2024, 11:28 AM  Clinical Narrative:     HF CSW met with patient at bedside. Patient stated she was driving prior to hospitalization. Patient stated that she has current [facility] services (2.5 hrs/daily) through [employer]. Patient inquired about an increase in hours. Patient stated that she's uses a walker. Patient stated that she has a scale at home. Patient stated that she goes to her PCP every 6 months. CSW explained that a hospital follow up appointment is typically scheduled closer towards dc. Patient is agreeable. Patient also inquired about VAD support groups. CSW reached out to outpatient CSW to get info.   HF CSW/CM will continue to follow and monitor for dc readiness.               Expected Discharge Plan: Home/Self Care Barriers to Discharge: Continued Medical Work up   Patient Goals and CMS Choice Patient states their goals for this hospitalization and ongoing recovery are:: get more comfortale with VAD and return home CMS Medicare.gov Compare Post Acute Care list provided to:: Patient Choice offered to / list presented to : Patient [place] ownership interest in [facility].provided to:: Patient    Expected Discharge Plan and Services       Living arrangements for the past 2 months: Single Family Home                                      Prior Living Arrangements/Services Living arrangements for the past 2 months: Single Family Home Lives with:: Parents, Adult Children Patient language and need for interpreter reviewed:: Yes Do you feel safe going back to the place where you live?: Yes      Need for Family Participation in Patient Care: No (Comment) Care giver support system in place?: Yes (comment)   Criminal  Activity/Legal Involvement Pertinent to Current Situation/Hospitalization: No - Comment as needed  Activities of Daily Living   ADL Screening (condition at time of admission) Independently performs ADLs?: Yes (appropriate for developmental age) Is the patient deaf or have difficulty hearing?: No Does the patient have difficulty seeing, even when wearing glasses/contacts?: No Does the patient have difficulty concentrating, remembering, or making decisions?: No  Permission Sought/Granted Permission sought to share information with : Family Supports, PCP, Case Manager Permission granted to share information with : Yes, Verbal Permission Granted              Emotional Assessment Appearance:: Appears stated age Attitude/Demeanor/Rapport: Engaged Affect (typically observed): Appropriate Orientation: : Oriented to Self, Oriented to Place, Oriented to  Time, Oriented to Situation Alcohol  / Substance Use: Not Applicable Psych Involvement: No (comment)  Admission diagnosis:  Infection associated with driveline of left ventricular assist device (LVAD) [T82.7XXA] Patient Active Problem List   Diagnosis Date Noted   Nonadherence to medical treatment 02/07/2024   Valvular heart disease 02/07/2024   Acute on chronic congestive heart failure (HCC) 02/07/2024   Infection associated with driveline of left ventricular assist device (LVAD) 02/06/2024   Complex dental caries 09/05/2023   LVAD (left ventricular assist device) present (HCC) 07/24/2023   Severe mitral regurgitation 07/09/2023   Prediabetes 05/26/2023   HFrEF (heart failure with reduced ejection fraction) (  HCC) 07/08/2022   Closed cervical spine fracture/C2 09/22/2021   Obesity (BMI 30-39.9) 09/22/2021   Essential hypertension 06/05/2021   Lumbar degenerative disc disease 12/14/2018   Chronic anticoagulation 03/24/2018   S/P IVC filter 12/01/2017   Chronic bilateral low back pain with bilateral sciatica 12/01/2017   Chronic pain  syndrome 12/01/2017   Uterine leiomyoma 08/18/2014   Severe Iron  deficiency anemia 02/05/2014   Secondary pulmonary hypertension 07/06/2013   OSA on CPAP 07/06/2013   Tobacco use disorder 04/27/2013   PCP:  Mancil Pfeiffer, PA-C Pharmacy:   [facility] - [place], [place] - [address] [place] [address] Phone: [phone] Fax: [phone]  [place] [facility] [address] [place] [address] Phone: [phone] Fax: [phone]     Social Drivers of Health (SDOH) Social History: SDOH Screenings   Food Insecurity: No Food Insecurity (02/07/2024)  Housing: Low Risk (02/07/2024)  Transportation Needs: Unmet Transportation Needs (02/07/2024)  Utilities: Not At Risk (02/07/2024)  Alcohol  Screen: Low Risk (12/15/2023)  Depression (PHQ2-9): Low Risk (12/16/2023)  Financial Resource Strain: Low Risk (12/15/2023)  Physical Activity: Insufficiently Active (12/15/2023)  Social Connections: Moderately Isolated (12/15/2023)  Stress: No Stress Concern Present (12/15/2023)  Tobacco Use: High Risk (02/10/2024)   SDOH Interventions: Transportation Interventions: [employer] Provided, Inpatient TOC Social Connections Interventions: Community Resources Provided, Inpatient TOC   Readmission Risk Interventions    07/03/2022   12:12 PM 02/13/2022   12:41 PM 12/31/2021    1:52 PM  Readmission Risk Prevention Plan  Transportation Screening Complete Complete   HRI or Home Care Consult Complete Complete Complete  Social Work Consult for Recovery Care Planning/Counseling Complete Complete Complete  Palliative Care Screening Not Applicable Not Applicable Not Applicable  Medication Review Oceanographer) Complete Complete Complete

## 2024-02-11 NOTE — Plan of Care (Signed)

## 2024-02-11 NOTE — Progress Notes (Signed)
 PHARMACY - ANTICOAGULATION CONSULT NOTE  Pharmacy Consult for warfarin Indication: LVAD HM3 with hx PE/DVT  Allergies[1]  Patient Measurements: Height: 5' 10 (177.8 cm) Weight: 112.4 kg (247 lb 12.8 oz) IBW/kg (Calculated) : 68.5 HEPARIN  DW (KG): 94  Vital Signs: Temp: 98.2 F (36.8 C) (01/21 0807) Temp Source: Oral (01/21 0807) BP: 101/88 (01/21 0807) Pulse Rate: 66 (01/21 0807)  Labs: Recent Labs    02/09/24 0111 02/10/24 0217 02/11/24 0220  HGB 11.4* 11.5* 12.0  HCT 35.2* 35.4* 37.2  PLT 119* 120* 145*  APTT  --  33  --   LABPROT 16.4* 14.9 14.7  INR 1.2 1.1 1.1  HEPARINUNFRC <0.10* <0.10*  --   CREATININE 1.28* 1.43* 1.39*    Estimated Creatinine Clearance: 68 mL/min (A) (by C-G formula based on SCr of 1.39 mg/dL (H)).   Medical History: Past Medical History:  Diagnosis Date   Anemia    Blood transfusion without reported diagnosis    CAD (coronary artery disease)    Nonobstructive   Closed left ankle fracture    August 30 2012   Clotting disorder    DVT (deep venous thrombosis) (HCC)    L leg   Fibroid tumor    HFrEF (heart failure with reduced ejection fraction) (HCC)    Hidradenitis    Hypertension    LVAD (left ventricular assist device) present [facility])    Mitral regurgitation    Myocardial infarction (HCC)    NICM (nonischemic cardiomyopathy) (HCC)    NSVT (nonsustained ventricular tachycardia) (HCC)    Obesity    OSA on CPAP    Pulmonary embolism (HCC) 04/2013   Sleep apnea     Medications:  Scheduled:   atorvastatin   20 mg Oral q1800   digoxin   0.125 mg Oral Daily   gabapentin   300 mg Oral BID   losartan   50 mg Oral Daily   pantoprazole   40 mg Oral Daily   spironolactone   25 mg Oral Daily   torsemide   40 mg Oral Daily    Assessment: 30 yof presenting with concern for driveline infection - plan to admit for IV antibiotics and evaluation for possible I&D. On warfarin PTA for LVAD + hx DVT/PE.   PTA warfarin regimen is  5 mg daily  except 7.5 mg Tues/Sat at last clinic appointment.  Heparin  was restarted at 500 units/hr on 1/21 this morning. INR today is low at 1.1 as expected with warfarin being held. Hgb 12, plt 145, LDH 181. Having some oozing during dressing change today.  Per discussion with AHF MD, plan to continue low dose heparin  infusion while INR remains <1.5.   Underwent I&D yesterday.   Goal of Therapy:  INR 2-3 Heparin  level <0.3 - fixed rate unless directed by MD Monitor platelets by anticoagulation protocol: Yes   Plan:  Continue heparin  infusion at 500 units/hr fixed rate Continue to hold warfarin tonight - will reassess on 1/22 Monitor daily INR, HL, CBC, and for s/sx of bleeding  Thank you for allowing pharmacy to participate in this patient's care,  Suzen Sour, PharmD, BCCCP Clinical Pharmacist  Phone: [phone] 02/11/2024 9:46 AM  Please check AMION for all [facility] Pharmacy phone numbers After 10:00 PM, call Main Pharmacy [phone]      [1]  Allergies Allergen Reactions   Sulfa  Antibiotics Itching, Swelling and Rash    Lip swelling

## 2024-02-11 NOTE — Progress Notes (Addendum)
 LVAD Coordinator Rounding Note:  Admitted 02/06/24 from VAD clinic for drive line infection to advanced heart failure service  HM III LVAD implanted on 07/24/23 by Dr. Lucas under destination criteria.  Pt laying in bed on my arrival. Denies complaints. Reports good pain control with PRN pain medications.   Had drive line debridement yesterday. Dressing changed today at bedside. See below for documentation. Site oozing sanguinous drainage when packing was removed. Will hold off on starting Coumadin  today with oozing per verbal discussion with Dr Daniel at [facility] this morning. Pharmacy and rounding team made aware.  Spoke with pt's Wylie Hunting this morning re: wound care training. She does not want to be trained on how to perform wound care. Pt's mother unable to perform wound care. Discussed with Hunting need for pt to have reliable transportation to clinic appts for wound care. Pt will try to schedule rides through her insurance, and Hunting will plan to supplement transportation when needed. Pt will need daily wound care in VAD clinic at hospital discharge.   Vital signs: Temp: 98.2 HR: 67 Doppler Pressure: 92 Automatic BP: 101/88 (94) O2 Sat: 98% on RA Wt: 251.7>250.6>247.8  lbs  LVAD interrogation reveals:  Speed: 6000 Flow: 4.6 Power: 4.7 w PI: 3.1  Alarms: none Events: none Hematocrit: 38  Fixed speed: 6000 Low speed limit: 5700  Drive Line: Pt premedicated by bedside RN prior to wound care. Existing VAD dressing/packing removed and site care performed using sterile technique. Drive line exit site/incision cleaned with CHG swab x 2, allowed to dry. Packed incision with 3/4 of a large VASHE moistened kerlex. Covered Silverlon patch, with several dry 4 x 4, and an ABD pad. Red rubber with suture intact directly at exit site. Incisional sutures intact- skin well approximated. Exit site partially incorporated. Velour removed in OR, driveline significantly exposed at exit site.  Moderate amount of serosanguinous drainage on previous dressing. Packing site oozing sanguinous drainage after packing removal. Manual pressure held briefly until hemostasis achieved. Tenderness present with packing. No foul odor or rash noted. Anchor reapplied. Daily dressing changes by bedside nurse or VAD coordinator. Next dressing change due 02/12/23.       Labs:  LDH trend: 179>196>181  INR trend: 1.2>1.1>1.1  Anticoagulation Plan: - INR Goal: 2.0-3.0 - ASA Dose: n/a - Coumadin  dosing per pharmacy  Arrythmias:  HX: NSVT on p.o. amiodarone   Infection: 01/04/25>> wound cx >> few eikenella corrodens 02/06/24>> blood cultures >> no growth 3 days 02/10/24>>OR wound cx>>rare proteus mirabilis 02/10/24>>OR fungal cx>>pending 02/10/24>> OR wound cx #2>> rare proteus mirabilis 02/10/24>> OR fungal cx #2>>pending 02/10/24>> OR drive line velour>> few proteus mirabilis 02/10/24>> OR drive line velour fungal cx>> pending  Drips:  Heparin  500 units/hr  Adverse Events on VAD:  Plan/Recommendations:  1. Page VAD coordinator with any VAD alarms or equipment issues. 2. Daily drive line dressing changes per bedside RN or VAD coordinator  [name] RN VAD Coordinator  Office: [phone]  24/7 Pager: [phone]

## 2024-02-12 DIAGNOSIS — N611 Abscess of the breast and nipple: Secondary | ICD-10-CM | POA: Diagnosis not present

## 2024-02-12 DIAGNOSIS — B9689 Other specified bacterial agents as the cause of diseases classified elsewhere: Secondary | ICD-10-CM | POA: Diagnosis not present

## 2024-02-12 DIAGNOSIS — Z95811 Presence of heart assist device: Secondary | ICD-10-CM | POA: Diagnosis not present

## 2024-02-12 DIAGNOSIS — T827XXD Infection and inflammatory reaction due to other cardiac and vascular devices, implants and grafts, subsequent encounter: Secondary | ICD-10-CM | POA: Diagnosis not present

## 2024-02-12 DIAGNOSIS — Z5181 Encounter for therapeutic drug level monitoring: Secondary | ICD-10-CM

## 2024-02-12 DIAGNOSIS — B964 Proteus (mirabilis) (morganii) as the cause of diseases classified elsewhere: Secondary | ICD-10-CM

## 2024-02-12 LAB — HEPARIN LEVEL (UNFRACTIONATED): Heparin Unfractionated: <0.1 [IU]/mL — ABNORMAL LOW (ref 0.30–0.70)

## 2024-02-12 LAB — BASIC METABOLIC PANEL WITH GFR
Anion gap: 10 (ref 5–15)
BUN: 37 mg/dL — ABNORMAL HIGH (ref 6–20)
CO2: 27 mmol/L (ref 22–32)
Calcium: 9.2 mg/dL (ref 8.9–10.3)
Chloride: 100 mmol/L (ref 98–111)
Creatinine, Ser: 1.24 mg/dL — ABNORMAL HIGH (ref 0.44–1.00)
GFR, Estimated: 54 mL/min — ABNORMAL LOW
Glucose, Bld: 100 mg/dL — ABNORMAL HIGH (ref 70–99)
Potassium: 4.6 mmol/L (ref 3.5–5.1)
Sodium: 137 mmol/L (ref 135–145)

## 2024-02-12 LAB — SEDIMENTATION RATE: Sed Rate: 35 mm/h — ABNORMAL HIGH (ref 0–22)

## 2024-02-12 LAB — C-REACTIVE PROTEIN: CRP: 0.6 mg/dL

## 2024-02-12 LAB — PROTIME-INR
INR: 1.1 (ref 0.8–1.2)
Prothrombin Time: 14.3 s (ref 11.4–15.2)

## 2024-02-12 LAB — LACTATE DEHYDROGENASE: LDH: 166 U/L (ref 105–235)

## 2024-02-12 MED ORDER — MORPHINE SULFATE (PF) 2 MG/ML IV SOLN
2.0000 mg | Freq: Once | INTRAVENOUS | Status: AC
  Administered 2024-02-12: 2 mg via INTRAVENOUS
  Filled 2024-02-12 (×2): qty 1

## 2024-02-12 MED ORDER — LINEZOLID 600 MG PO TABS
600.0000 mg | ORAL_TABLET | Freq: Two times a day (BID) | ORAL | Status: DC
  Administered 2024-02-12 – 2024-02-23 (×23): 600 mg via ORAL
  Filled 2024-02-12 (×24): qty 1

## 2024-02-12 NOTE — Progress Notes (Addendum)
 "        [facility]  Date of Admission:  02/06/2024      Total days of antibiotics 6   Unasyn           ASSESSMENT: [name] [name] is a 49 y.o. 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 She is well appearing today and tolerating antibiotics fine without any side effects.  3 operative specimens reviewed: Proteus Mirabilis growing x 3 sites with one growing out a corynebacterium species. Will add linezolid  600 mg BID to cover this which is absorbed and has PK distribution like IV version. Follow for further speciation. Continue unasyn  - proteus is susceptible to this while we ensure no eikenella recovered. Not ideal for home use however so once final will adjust once all is final.  - continue unasyn   - adding linezolid  BID for corynebacterium species on 1 culture - follow ID  - BCx 1/16 without growth final  - Will follow cultures through to maturity.  - FU for any changes to operative plans   Chronic breast abscess, right - She was asking me about this today. CT scan Right breast mass measuring 4.6 x 3.4 cm with overlying skin thickening, concerning for malignancy or infection/abscess. On exam she has a small open ulcerated fistulous track noted around the areola 2 o'clock position.  There is some clear milky fluid nothing granular in appearance.  The tissue is to the touch but has a woodiness to palpation.  R breast biopsy in 2022 which was benign  This could certainly represent chronic actinomyces infection which makes sense with her dental problems, other oral flora in LVAD wound and the fact that the drainage improved on the augmentin  outpatient. This can also mimic malignancy as well.  - D/W HF team and Dr. Fleeta Rothman  - should be covered with amp/sulbactam -  consider this in spectrum of coverage for final recs.    Vascular Access -  -PICC will be needed - defer timing to HF team    Discharge Planning / Coordination of Care -  -Outpatient antibiotics PENDING FINAL CULTURES  -Discussed with ID pharmacy & Dr. Fleeta Rothman  -Holding warfarin still d/t oozing at debridement site so will be here a bit    Medication Management -  Creatinine stable - no antibiotic dose changes recommended  - CRP / ESR reviewed - trend over treatment  - follow weekly CBC, CMP - she is on warfarin at baseline - will need review with abx choice for any possible INR interactions - Follow platelets now on linezolid     Isolation recommendations -  Standard / universal    PLAN: - continue unasyn   - adding linezolid  BID for corynebacterium species on 1 culture - follow ID  - Will follow cultures through to maturity and provide final recs - FU for any changes to operative plans  - PICC line timing per HF team  - breast mass / abscess noted - ? Actinomyces infection that can mimic malignancy.    Principal Problem:   Infection associated with driveline of left ventricular assist device (LVAD) Active Problems:   Nonadherence to medical treatment   Valvular heart disease   Acute on chronic congestive heart failure (HCC)   Proteus mirabilis infection   Encounter for medication monitoring  atorvastatin   20 mg Oral q1800   digoxin   0.125 mg Oral Daily   gabapentin   300 mg Oral BID   linezolid   600 mg Oral Q12H   losartan   50 mg Oral Daily   pantoprazole   40 mg Oral Daily   spironolactone   25 mg Oral Daily   torsemide   40 mg Oral Daily    SUBJECTIVE: Doing well w/o concerns.   Review of Systems: Review of Systems  Constitutional:  Negative for chills and fever.  HENT:  Negative for tinnitus.   Eyes:  Negative for blurred vision and photophobia.  Respiratory:  Negative for cough and sputum production.   Cardiovascular:  Negative for chest pain.   Gastrointestinal:  Negative for diarrhea, nausea and vomiting.  Genitourinary:  Negative for dysuria.  Skin:  Negative for rash.  Neurological:  Negative for headaches.    Allergies[1]  OBJECTIVE: Vitals:   02/12/24 0429 02/12/24 0816 02/12/24 0828 02/12/24 1227  BP:   99/76 98/83  Pulse:  70 64 68  Resp:      Temp:   97.8 F (36.6 C) 97.8 F (36.6 C)  TempSrc:   Oral Oral  SpO2:      Weight: 114.5 kg     Height:       Body mass index is 36.22 kg/m.  Physical Exam Constitutional:      Appearance: Normal appearance. She is not ill-appearing.  HENT:     Mouth/Throat:     Mouth: Mucous membranes are moist.     Pharynx: Oropharynx is clear.  Eyes:     General: No scleral icterus. Cardiovascular:     Rate and Rhythm: Normal rate.  Pulmonary:     Effort: Pulmonary effort is normal.  Chest:       Comments: Small open ulcerated fistulous track noted around the areola 2 o'clock position.  There is some clear milky fluid nothing granular in appearance.  The tissue is to the touch but has a woodiness to palpation.  Skin:    General: Skin is warm and dry.     Findings: No rash.  Neurological:     Mental Status: She is oriented to person, place, and time.  Psychiatric:        Mood and Affect: Mood normal.        Thought Content: Thought content normal.     Lab Results Lab Results  Component Value Date   WBC 6.5 02/11/2024   HGB 12.0 02/11/2024   HCT 37.2 02/11/2024   MCV 92.3 02/11/2024   PLT 145 (L) 02/11/2024    Lab Results  Component Value Date   CREATININE 1.24 (H) 02/12/2024   BUN 37 (H) 02/12/2024   NA 137 02/12/2024   K 4.6 02/12/2024   CL 100 02/12/2024   CO2 27 02/12/2024    Lab Results  Component Value Date   ALT 12 01/05/2024   AST 24 01/05/2024   ALKPHOS 83 01/05/2024   BILITOT 0.8 01/05/2024     Microbiology: Recent Results (from the past 240 hours)  Culture, blood (Routine X 2) w Reflex to ID Panel     Status: None   Collection  Time: 02/06/24 11:48 AM   Specimen: BLOOD  Result Value Ref Range Status   Specimen Description BLOOD BLOOD RIGHT HAND  Final   Special Requests   Final    BOTTLES DRAWN AEROBIC AND ANAEROBIC Blood Culture adequate volume   Culture   Final    NO GROWTH 5 DAYS  Performed at [facility], [address] [place], [place] [address]    Report Status 02/11/2024 FINAL  Final  Culture, blood (Routine X 2) w Reflex to ID Panel     Status: None   Collection Time: 02/06/24 12:00 PM   Specimen: BLOOD  Result Value Ref Range Status   Specimen Description BLOOD LEFT ANTECUBITAL  Final   Special Requests   Final    BOTTLES DRAWN AEROBIC AND ANAEROBIC Blood Culture adequate volume   Culture   Final    NO GROWTH 5 DAYS Performed at [facility], [address] [place], [place] [address]    Report Status 02/11/2024 FINAL  Final  MRSA Next Gen by PCR, Nasal     Status: None   Collection Time: 02/06/24  4:07 PM   Specimen: Nasal Mucosa; Nasal Swab  Result Value Ref Range Status   MRSA by PCR Next Gen NOT DETECTED NOT DETECTED Final    Comment: (NOTE) The GeneXpert MRSA Assay (FDA approved for NASAL specimens only), is one component of a comprehensive MRSA colonization surveillance program. It is not intended to diagnose MRSA infection nor to guide or monitor treatment for MRSA infections. Test performance is not FDA approved in patients less than 71 years old. Performed at [facility], [address], [place], [place] [address]   Surgical pcr screen     Status: None   Collection Time: 02/10/24  4:47 AM   Specimen: Nasal Mucosa; Nasal Swab  Result Value Ref Range Status   MRSA, PCR NEGATIVE NEGATIVE Final   Staphylococcus aureus NEGATIVE NEGATIVE Final    Comment: (NOTE) The Xpert SA Assay (FDA approved for NASAL specimens in patients 54 years of age and older), is one component of a comprehensive surveillance program. It is not intended to diagnose infection nor  to guide or monitor treatment. Performed at [facility], [address], [place], [place] [address]   Aerobic/Anaerobic Culture w Gram Stain (surgical/deep wound)     Status: None (Preliminary result)   Collection Time: 02/10/24 10:47 AM   Specimen: Abdomen; Wound  Result Value Ref Range Status   Specimen Description ABDOMEN  Final   Special Requests DRIVE LINE INFECTION  Final   Gram Stain   Final    FEW WBC PRESENT, PREDOMINANTLY PMN NO ORGANISMS SEEN    Culture   Final    RARE PROTEUS MIRABILIS NO ANAEROBES ISOLATED; CULTURE IN PROGRESS FOR 5 DAYS SUSCEPTIBILITIES PERFORMED ON PREVIOUS CULTURE WITHIN THE LAST 5 DAYS. Performed at [facility], [address], [place], [place] [address]    Report Status PENDING  Incomplete  Aerobic/Anaerobic Culture w Gram Stain (surgical/deep wound)     Status: None (Preliminary result)   Collection Time: 02/10/24 10:51 AM   Specimen: Abdomen; Wound  Result Value Ref Range Status   Specimen Description ABDOMEN  Final   Special Requests DRIVE LINE TISSUE  Final   Gram Stain   Final    FEW WBC PRESENT, PREDOMINANTLY PMN RARE GRAM POSITIVE COCCI    Culture   Final    RARE PROTEUS MIRABILIS NO ANAEROBES ISOLATED; CULTURE IN PROGRESS FOR 5 DAYS SUSCEPTIBILITIES PERFORMED ON PREVIOUS CULTURE WITHIN THE LAST 5 DAYS. Performed at [facility], [address], [place], [place] [address]    Report Status PENDING  Incomplete  Aerobic/Anaerobic Culture w Gram Stain (surgical/deep wound)     Status: None (Preliminary result)   Collection Time:  02/10/24 10:58 AM   Specimen: Abdomen; Wound  Result Value Ref Range Status   Specimen Description ABDOMEN  Final   Special Requests DRIVE LINE VELOUR  Final   Gram Stain   Final    RARE WBC SEEN NO ORGANISMS SEEN Performed at [facility], [address] [place], [place] [address]    Culture   Final    FEW PROTEUS MIRABILIS FEW DIPHTHEROIDS(CORYNEBACTERIUM  SPECIES) Standardized susceptibility testing for this organism is not available. NO ANAEROBES ISOLATED; CULTURE IN PROGRESS FOR 5 DAYS    Report Status PENDING  Incomplete   Organism ID, Bacteria PROTEUS MIRABILIS  Final      Susceptibility   Proteus mirabilis - MIC*    AMPICILLIN  <=2 SENSITIVE Sensitive     CEFAZOLIN  (NON-URINE) 4 INTERMEDIATE Intermediate     CEFEPIME  0.5 SENSITIVE Sensitive     ERTAPENEM <=0.12 SENSITIVE Sensitive     CEFTRIAXONE  <=0.25 SENSITIVE Sensitive     CIPROFLOXACIN <=0.06 SENSITIVE Sensitive     GENTAMICIN <=1 SENSITIVE Sensitive     MEROPENEM <=0.25 SENSITIVE Sensitive     TRIMETH /SULFA  <=20 SENSITIVE Sensitive     AMPICILLIN /SULBACTAM <=2 SENSITIVE Sensitive     PIP/TAZO Value in next row Sensitive      <=4 SENSITIVEThis is a modified FDA-approved test that has been validated and its performance characteristics determined by the reporting laboratory.  This laboratory is certified under the Clinical Laboratory Improvement Amendments CLIA as qualified to perform high complexity clinical laboratory testing.    * FEW PROTEUS MIRABILIS    Corean Fireman, MSN, NP-C [facility]  [email or web address] Pager: [phone] Office: [phone] [facility] Main Line: [phone] *Secure Chat Communication Welcome       [1]  Allergies Allergen Reactions   Sulfa  Antibiotics Itching, Swelling and Rash    Lip swelling   "

## 2024-02-12 NOTE — Progress Notes (Signed)
 "  Advanced Heart Failure VAD Team Note  PCP-Cardiologist: None  Chief Complaint: LVAD infection  Patient Profile   [name] [name] is a 49 y.o. female with hx of saddle PE/DVT s/p IVC filter (2015), HFrEF/NICM, pancytopenia, nonobstructive CAD, morbid obesity, noncompliance, pancytopenia (followed by hematology), tobacco use. Admitted with LVAD DL infection  Significant events:    Admitted 1/16 for DL infection 8/79/73: S/p OR for DL debiredment  Subjective:    Continues on Unasyn . BC NGTD.   Wound cx with rare proteus mirabilis.  Feels ok this morning, just some pain during dressing changes.   LVAD INTERROGATION:  HeartMate 3 VAD Equipment Check Pump Speed (RPM): 6000 RPM Pump Flow (LPM): 4.4 Power (Watts): 5 Watts Pulsatility Index: 2.6 Fixed Speed Limit: 8000 rpm Low Speed Limit: 5700 rpm Alarms: No alarms Auscultated: Normal expected humming Power Module Self-Test (Daily): Done System Controller Self-Test: Passed Patient Battery Source: [facility] / Wall unit Emergency Equipment at Bedside: Yes  X5 PI events today   Objective:   Vital Signs:   Temp:  [97.5 F (36.4 C)-98.3 F (36.8 C)] 97.8 F (36.6 C) (01/22 0828) Pulse Rate:  [61-75] 64 (01/22 0828) Resp:  [15-18] 18 (01/22 0400) BP: (85-118)/(52-88) 99/76 (01/22 0828) SpO2:  [96 %-98 %] 98 % (01/22 0400) Weight:  [114.5 kg] 114.5 kg (01/22 0429) Last BM Date : 02/10/24 Mean arterial Pressure 80s-90s  Intake/Output:   Intake/Output Summary (Last 24 hours) at 02/12/2024 1038 Last data filed at 02/12/2024 0800 Gross per 24 hour  Intake 276.48 ml  Output --  Net 276.48 ml    Physical Exam  General:  Well appearing. No resp difficulty Neck:  JVP flat Cor: Mechanical heart sounds with LVAD hum present. Lungs: Clear Driveline: C/D/I; securement device intact and driveline incorporated Extremities: no edema Neuro: alert & oriented x3. Affect pleasant   Telemetry   NSR 60s-70s (Personally reviewed)     Labs  Basic Metabolic Panel: Recent Labs  Lab 02/07/24 0148 02/08/24 0201 02/09/24 0111 02/10/24 0217 02/11/24 0220  NA 133* 135 135 137 135  K 4.5 4.6 4.8 4.6 4.7  CL 97* 95* 96* 98 96*  CO2 29 33* 32 33* 32  GLUCOSE 82 68* 80 63* 108*  BUN 28* 27* 38* 42* 43*  CREATININE 1.17* 1.14* 1.28* 1.43* 1.39*  CALCIUM  9.0 9.1 9.2 9.4 9.5    Liver Function Tests: No results for input(s): AST, ALT, ALKPHOS, BILITOT, PROT, ALBUMIN  in the last 168 hours. No results for input(s): LIPASE, AMYLASE in the last 168 hours. No results for input(s): AMMONIA in the last 168 hours.  CBC: Recent Labs  Lab 02/07/24 0148 02/08/24 0201 02/09/24 0111 02/10/24 0217 02/11/24 0220  WBC 3.8* 3.0* 3.4* 3.5* 6.5  HGB 11.4* 12.1 11.4* 11.5* 12.0  HCT 34.3* 37.0 35.2* 35.4* 37.2  MCV 91.0 91.1 91.9 91.2 92.3  PLT 111* 123* 119* 120* 145*    INR: Recent Labs  Lab 02/08/24 0201 02/09/24 0111 02/10/24 0217 02/11/24 0220 02/12/24 0201  INR 1.4* 1.2 1.1 1.1 1.1    Medications:    Scheduled Medications:  atorvastatin   20 mg Oral q1800   digoxin   0.125 mg Oral Daily   gabapentin   300 mg Oral BID   losartan   50 mg Oral Daily   pantoprazole   40 mg Oral Daily   spironolactone   25 mg Oral Daily   torsemide   40 mg Oral Daily    Infusions:  ampicillin -sulbactam (UNASYN ) IV 3 g (02/12/24 0826)  heparin  500 Units/hr (02/11/24 1540)    PRN Medications: acetaminophen , albuterol , HYDROmorphone  (DILAUDID ) injection, lip balm, ondansetron  (ZOFRAN ) IV, oxyCODONE -acetaminophen , traMADol   Assessment/Plan:    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  Related to poor outpatient dressing care.   - CT abdomen pelvis with contrast - unremarkable except for R breast mass (see below) - Continue IV unasyn . BC NGTD. Afebrile. - Wound Cx with rare proteus mirabilis  - S/p post I&D with Dr. Daniel 1/20.   Chronic systolic HF with  biventricular dysfunction/stage D cardiomyopathy s/p HM3 07/24/2023 with concurrent MVR - Continue speed at 6000 RPM, stable - Stable NYHA class II - VAD MAPs stable. Continue losartan  50 mg daily - Continue torsemide  40mg  daily. - Continue digoxin  0.125 mg daily, last dig level <0.6 - Continue spironolactone  25mg  daily - Dressing change this morning again with some oozing. Will continue to hold warfarin tonight. - INR 1.1. Back on hep gtt.  Discussed with PharmD.    Severe MR s/p MVR - Echo 4/25 with severe MR  - TEER on 07/09/23 complicated by worsening MR - S/p MVR   Hx of PE/DVT w/ IVC filter - Evaluated at [facility] where thrombophilia work-up was negative (Lupus inhibitor, anticardiolipin antibodies, and anti beta-2  glycoprotein antibodies were negative; protein C activity 85%, protein S activity 76%, factor V Leiden, and prothrombin gene mutation ) - IVC filter in place; could not be removed due to significant thrombus burden on filter at [facility].  - Holding warfarin as above - Continue hep gtt.    Obesity  - BMI significantly improved, will need GLP-1 later if considering txp - Body mass index is 36.22 kg/m.   CAD - LHC in 5/23 w/ nonobstructive CAD involving RCA - Inferior WMA on most recent echo - no s/s angina   Tobacco use - understands need to quit   Right breast mass on CT - malignancy vs infection - will need mammogram - Has had R breast biopsy in 2022 which was benign    I reviewed the LVAD parameters from today, and compared the results to the patient's prior recorded data.  No programming changes were made.  The LVAD is functioning within specified parameters.  The patient performs LVAD self-test daily.  LVAD interrogation was negative for any significant power changes, alarms or PI events/speed drops.  LVAD equipment check completed and is in good working order.  Back-up equipment present.   LVAD education done on emergency procedures and precautions and reviewed exit  site care.  Length of Stay: 6  Beckey LITTIE Coe, NP 02/12/2024, 10:38 AM  VAD Team --- VAD ISSUES ONLY--- Pager [phone] (7am - 7am)  Advanced Heart Failure Team  Pager [phone] (M-F; 7a - 5p)   Please visit [email or web address]: For overnight coverage please call cardiology fellow first. If fellow not available call Shock/ECMO MD on call.  For ECMO / Mechanical Support (Impella, IABP, LVAD) issues call Shock / ECMO MD on call.    "

## 2024-02-12 NOTE — Progress Notes (Signed)
 LVAD Coordinator Rounding Note:  Admitted 02/06/24 from VAD clinic for drive line infection to advanced heart failure service  HM III LVAD implanted on 07/24/23 by Dr. Lucas under destination criteria.  Pt laying in bed on my arrival. Denies complaints. Dressing changed today at bedside. See below for documentation.  PRN pain medication administered prior to wound packing. Pt reports increased pain of 8.5/10 during dressing change. Dressing change paused. A one time dose of Morphine  2mg  IV ordered per Beckey Coe NP. Wound packing resumed following administration of IV pain medication. Pt reports significant improvement in pain with dressing change following IV pain medication administration.   Site oozing sanguinous drainage when packing was removed. Pharmacy and rounding team made aware. Per AHF MD, plan to continue low dose heparin  infusion while INR remains <1.5.    Discussed importance of compliance with wound care with pt at bedside today. She is going to discuss training with a family member who has health care experience to see if they are willing to learn how to change the dressing.   Vital signs: Temp: 97.8 HR: 64 Doppler Pressure: 84 Automatic BP: 99/76 (85) O2 Sat: 98% on RA Wt: 251.7>250.6>247.8>252.4  lbs  LVAD interrogation reveals:  Speed: 6000 Flow: 4.4 Power: 4.7 w PI: 2.4  Alarms: none Events: none Hematocrit: 38  Fixed speed: 6000 Low speed limit: 5700  Drive Line: Pt premedicated by bedside RN prior to wound care. Existing VAD dressing/packing removed and site care performed using sterile technique. Drive line exit site/incision cleaned with CHG swab x 2, allowed to dry. Packed incision with 3/4 of a large VASHE moistened kerlex. Covered Silverlon patch, with several dry 4 x 4, and an ABD pad. Red rubber with suture intact directly at exit site. Incisional sutures intact- skin well approximated. Exit site partially incorporated. Velour removed in OR, driveline  significantly exposed at exit site. Moderate amount of serosanguinous drainage on previous dressing. Packing site oozing sanguinous drainage after packing removal. Manual pressure held briefly until hemostasis achieved. Tenderness present with packing. No foul odor or rash noted. Anchor reapplied. Daily dressing changes by bedside nurse or VAD coordinator. Next dressing change due 02/13/23.        Labs:  LDH trend: 179>196>181  INR trend: 1.2>1.1>1.1  Anticoagulation Plan: - INR Goal: 2.0-3.0 - ASA Dose: n/a - Coumadin  dosing per pharmacy  Arrythmias:  HX: NSVT on p.o. amiodarone   Infection: 01/04/25>> wound cx >> few eikenella corrodens 02/06/24>> blood cultures >> no growth 3 days 02/10/24>>OR wound cx>>rare proteus mirabilis 02/10/24>>OR fungal cx>>pending 02/10/24>> OR wound cx #2>> rare proteus mirabilis 02/10/24>> OR fungal cx #2>>pending 02/10/24>> OR drive line velour>> few proteus mirabilis 02/10/24>> OR drive line velour fungal cx>> pending  Drips:  Heparin  500 units/hr  Adverse Events on VAD:  Plan/Recommendations:  1. Page VAD coordinator with any VAD alarms or equipment issues. 2. Daily drive line dressing changes per bedside RN or VAD coordinator  [name] Lunger RN, BSN VAD Coordinator 24/7 Pager [phone]

## 2024-02-12 NOTE — Plan of Care (Signed)
" °  Problem: Education: Goal: Knowledge of General Education information will improve Description: Including pain rating scale, medication(s)/side effects and non-pharmacologic comfort measures Outcome: Progressing   Problem: Health Behavior/Discharge Planning: Goal: Ability to manage health-related needs will improve Outcome: Progressing   Problem: Clinical Measurements: Goal: Cardiovascular complication will be avoided Outcome: Progressing   Problem: Activity: Goal: Risk for activity intolerance will decrease Outcome: Progressing   Problem: Pain Managment: Goal: General experience of comfort will improve and/or be controlled Outcome: Progressing   Problem: Skin Integrity: Goal: Risk for impaired skin integrity will decrease Outcome: Progressing   "

## 2024-02-12 NOTE — Progress Notes (Signed)
 PHARMACY - ANTICOAGULATION CONSULT NOTE  Pharmacy Consult for warfarin Indication: LVAD HM3 with hx PE/DVT  Allergies[1]  Patient Measurements: Height: 5' 10 (177.8 cm) Weight: 114.5 kg (252 lb 6.8 oz) IBW/kg (Calculated) : 68.5 HEPARIN  DW (KG): 94  Vital Signs: Temp: 97.8 F (36.6 C) (01/22 0828) Temp Source: Oral (01/22 0828) BP: 99/76 (01/22 0828) Pulse Rate: 64 (01/22 0828)  Labs: Recent Labs    02/10/24 0217 02/11/24 0220 02/12/24 0201  HGB 11.5* 12.0  --   HCT 35.4* 37.2  --   PLT 120* 145*  --   APTT 33  --   --   LABPROT 14.9 14.7 14.3  INR 1.1 1.1 1.1  HEPARINUNFRC <0.10*  --  <0.10*  CREATININE 1.43* 1.39*  --     Estimated Creatinine Clearance: 68.6 mL/min (A) (by C-G formula based on SCr of 1.39 mg/dL (H)).   Medical History: Past Medical History:  Diagnosis Date   Anemia    Blood transfusion without reported diagnosis    CAD (coronary artery disease)    Nonobstructive   Closed left ankle fracture    August 30 2012   Clotting disorder    DVT (deep venous thrombosis) (HCC)    L leg   Fibroid tumor    HFrEF (heart failure with reduced ejection fraction) (HCC)    Hidradenitis    Hypertension    LVAD (left ventricular assist device) present [facility])    Mitral regurgitation    Myocardial infarction (HCC)    NICM (nonischemic cardiomyopathy) (HCC)    NSVT (nonsustained ventricular tachycardia) (HCC)    Obesity    OSA on CPAP    Pulmonary embolism (HCC) 04/2013   Sleep apnea     Medications:  Scheduled:   atorvastatin   20 mg Oral q1800   digoxin   0.125 mg Oral Daily   gabapentin   300 mg Oral BID   losartan   50 mg Oral Daily   pantoprazole   40 mg Oral Daily   spironolactone   25 mg Oral Daily   torsemide   40 mg Oral Daily    Assessment: 57 yof presenting with concern for driveline infection - plan to admit for IV antibiotics and evaluation for possible I&D. On warfarin PTA for LVAD + hx DVT/PE.   PTA warfarin regimen is  5 mg daily  except 7.5 mg Tues/Sat at last clinic appointment.  Heparin  was restarted at 500 units/hr - heparin  level this morning is <0.1 as expected. INR today is low at 1.1 as expected with warfarin being held. Hgb 12, plt 145 - yesterday. LDH 166. Having some oozing during dressing change yesterday - still continuing today.  Per discussion with AHF MD, plan to continue low dose heparin  infusion while INR remains <1.5.   Goal of Therapy:  INR 2-3 Heparin  level <0.3 - fixed rate unless directed by MD Monitor platelets by anticoagulation protocol: Yes   Plan:  Continue heparin  infusion at 500 units/hr fixed rate Hold warfarin tonight Monitor daily INR, HL, CBC, and for s/sx of bleeding  Thank you for allowing pharmacy to participate in this patient's care,  Suzen Sour, PharmD, BCCCP Clinical Pharmacist  Phone: [phone] 02/12/2024 9:59 AM  Please check AMION for all [facility] Pharmacy phone numbers After 10:00 PM, call Main Pharmacy [phone]     [1]  Allergies Allergen Reactions   Sulfa  Antibiotics Itching, Swelling and Rash    Lip swelling

## 2024-02-12 NOTE — TOC Progression Note (Addendum)
 Transition of Care [facility] - Progression Note    Patient Details  Name: [name] [name] MRN: [id number] Date of Birth: [birth year]  [facility]) CM/SW Contact  Roxie KANDICE Stain, RN Phone Number: 02/12/2024, 4:18 PM  Clinical Narrative:    Confirmed with patient that [employer] supplies a personal care service aide paid through Medicaid. Patient requesting more hours.  [place] left forearm put on patient's hard chart.  [place] with heart failure will complete Deandra made aware of the need to fax the form to [place] lift Pam with [facility] is following patient for possible home IV antibiotics.  [facility] will supply home health RN.   Expected Discharge Plan: Home/Self Care Barriers to Discharge: Continued Medical Work up               Expected Discharge Plan and Services       Living arrangements for the past 2 months: Single Family Home                                       Social Drivers of Health (SDOH) Interventions SDOH Screenings   Food Insecurity: No Food Insecurity (02/07/2024)  Housing: Low Risk (02/07/2024)  Transportation Needs: Unmet Transportation Needs (02/07/2024)  Utilities: Not At Risk (02/07/2024)  Alcohol  Screen: Low Risk (12/15/2023)  Depression (PHQ2-9): Low Risk (12/16/2023)  Financial Resource Strain: Low Risk (12/15/2023)  Physical Activity: Insufficiently Active (12/15/2023)  Social Connections: Moderately Isolated (12/15/2023)  Stress: No Stress Concern Present (12/15/2023)  Tobacco Use: High Risk (02/10/2024)    Readmission Risk Interventions    07/03/2022   12:12 PM 02/13/2022   12:41 PM 12/31/2021    1:52 PM  Readmission Risk Prevention Plan  Transportation Screening Complete Complete   HRI or Home Care Consult Complete Complete Complete  Social Work Consult for Recovery Care Planning/Counseling Complete Complete Complete  Palliative Care Screening Not Applicable Not Applicable Not Applicable  Medication Review Oceanographer)  Complete Complete Complete

## 2024-02-12 NOTE — TOC Progression Note (Signed)
 Transition of Care [facility]) - Progression Note    Patient Details  Name: [name] [name] MRN: [id number] Date of Birth: March 29, 1975  Transition [facility] Care [facility]) CM/SW Contact  [name] Phone Number: [phone] 02/12/2024, 10:59 AM  Clinical Narrative:   HF CSW contacted the outpatient CSW to see if the patient could be added to email list for VAD support group info. Opt CSW spoke with patient and added her to list.   HF CSW/CM will continue to follow and monitor for dc readiness.     Expected Discharge Plan: Home/Self Care Barriers to Discharge: Continued Medical Work up               Expected Discharge Plan and Services       Living arrangements for the past 2 months: Single Family Home                                       Social Drivers of Health (SDOH) Interventions SDOH Screenings   Food Insecurity: No Food Insecurity (02/07/2024)  Housing: Low Risk (02/07/2024)  Transportation Needs: Unmet Transportation Needs (02/07/2024)  Utilities: Not At Risk (02/07/2024)  Alcohol  Screen: Low Risk (12/15/2023)  Depression (PHQ2-9): Low Risk (12/16/2023)  Financial Resource Strain: Low Risk (12/15/2023)  Physical Activity: Insufficiently Active (12/15/2023)  Social Connections: Moderately Isolated (12/15/2023)  Stress: No Stress Concern Present (12/15/2023)  Tobacco Use: High Risk (02/10/2024)    Readmission Risk Interventions    07/03/2022   12:12 PM 02/13/2022   12:41 PM 12/31/2021    1:52 PM  Readmission Risk Prevention Plan  Transportation Screening Complete Complete   HRI or Home Care Consult Complete Complete Complete  Social Work Consult for Recovery Care Planning/Counseling Complete Complete Complete  Palliative Care Screening Not Applicable Not Applicable Not Applicable  Medication Review Oceanographer) Complete Complete Complete

## 2024-02-13 ENCOUNTER — Ambulatory Visit (HOSPITAL_COMMUNITY)

## 2024-02-13 ENCOUNTER — Telehealth (HOSPITAL_COMMUNITY): Payer: Self-pay

## 2024-02-13 ENCOUNTER — Other Ambulatory Visit (HOSPITAL_COMMUNITY): Payer: Self-pay

## 2024-02-13 DIAGNOSIS — T827XXD Infection and inflammatory reaction due to other cardiac and vascular devices, implants and grafts, subsequent encounter: Secondary | ICD-10-CM

## 2024-02-13 DIAGNOSIS — B964 Proteus (mirabilis) (morganii) as the cause of diseases classified elsewhere: Secondary | ICD-10-CM | POA: Diagnosis not present

## 2024-02-13 DIAGNOSIS — N611 Abscess of the breast and nipple: Secondary | ICD-10-CM | POA: Diagnosis not present

## 2024-02-13 LAB — PROTIME-INR
INR: 1 (ref 0.8–1.2)
Prothrombin Time: 14 s (ref 11.4–15.2)

## 2024-02-13 LAB — BASIC METABOLIC PANEL WITH GFR
Anion gap: 7 (ref 5–15)
BUN: 42 mg/dL — ABNORMAL HIGH (ref 6–20)
CO2: 31 mmol/L (ref 22–32)
Calcium: 9.1 mg/dL (ref 8.9–10.3)
Chloride: 99 mmol/L (ref 98–111)
Creatinine, Ser: 1.4 mg/dL — ABNORMAL HIGH (ref 0.44–1.00)
GFR, Estimated: 46 mL/min — ABNORMAL LOW
Glucose, Bld: 91 mg/dL (ref 70–99)
Potassium: 4.5 mmol/L (ref 3.5–5.1)
Sodium: 137 mmol/L (ref 135–145)

## 2024-02-13 LAB — HEPARIN LEVEL (UNFRACTIONATED): Heparin Unfractionated: <0.1 [IU]/mL — ABNORMAL LOW (ref 0.30–0.70)

## 2024-02-13 LAB — LACTATE DEHYDROGENASE: LDH: 164 U/L (ref 105–235)

## 2024-02-13 MED ORDER — WARFARIN SODIUM 5 MG PO TABS
5.0000 mg | ORAL_TABLET | Freq: Once | ORAL | Status: AC
  Administered 2024-02-13: 5 mg via ORAL
  Filled 2024-02-13: qty 1

## 2024-02-13 MED ORDER — OXYCODONE HCL 5 MG PO TABS
5.0000 mg | ORAL_TABLET | Freq: Once | ORAL | Status: AC
  Administered 2024-02-13: 5 mg via ORAL
  Filled 2024-02-13: qty 1

## 2024-02-13 MED ORDER — WARFARIN - PHARMACIST DOSING INPATIENT: Freq: Every day | Status: DC

## 2024-02-13 MED ORDER — SODIUM CHLORIDE 0.9 % IV SOLN
2.0000 g | INTRAVENOUS | Status: DC
  Administered 2024-02-13 – 2024-02-23 (×11): 2 g via INTRAVENOUS
  Filled 2024-02-13 (×11): qty 20

## 2024-02-13 MED ORDER — CEFTRIAXONE IV (FOR PTA / DISCHARGE USE ONLY): 2.0000 g | INTRAVENOUS | 0 refills | Status: AC

## 2024-02-13 NOTE — Progress Notes (Signed)
 "  Advanced Heart Failure VAD Team Note  PCP-Cardiologist: None  Chief Complaint: LVAD infection  Patient Profile   [name] [name] is a 49 y.o. female with hx of saddle PE/DVT s/p IVC filter (2015), HFrEF/NICM, pancytopenia, nonobstructive CAD, morbid obesity, noncompliance, pancytopenia (followed by hematology), tobacco use. Admitted with LVAD DL infection  Significant events:    Admitted 1/16 for DL infection 8/79/73: S/p OR for DL debridement  8/77 : ID added linezolid . Continued on ancef .   Subjective:    Continues on Unasyn + Linezolid . Wound cx with rare proteus mirabilis+ few diphtheroids.     LVAD INTERROGATION:  HeartMate 3 VAD Equipment Check Pump Speed (RPM): 6000 RPM Pump Flow (LPM): 4.3 Power (Watts): 5 Watts Pulsatility Index: 3.5 Fixed Speed Limit: 6000 rpm Low Speed Limit: 42999 rpm Alarms: No alarms Auscultated: Normal expected humming Power Module Self-Test (Daily): Done System Controller Self-Test: Passed Patient Battery Source: [facility] / Wall unit Emergency Equipment at Bedside: Yes  1PI event.   Denies SOB. Pain 8-9    Objective:   Vital Signs:   Temp:  [97.7 F (36.5 C)-98.2 F (36.8 C)] 98.2 F (36.8 C) (01/23 0737) Pulse Rate:  [60-68] 61 (01/23 0737) Resp:  [16-19] 18 (01/23 0737) BP: (87-99)/(62-86) 99/86 (01/23 0737) SpO2:  [96 %-99 %] 99 % (01/23 0737) Weight:  [115.6 kg] 115.6 kg (01/23 0412) Last BM Date : 02/10/24 Mean arterial Pressure 80s-90s  Intake/Output:   Intake/Output Summary (Last 24 hours) at 02/13/2024 0942 Last data filed at 02/13/2024 0931 Gross per 24 hour  Intake 600 ml  Output 1550 ml  Net -950 ml    Physical Exam  Physical Exam: GENERAL: No acute distress. In bed.  HEENT: normal  NECK: Supple, JVP flat   CARDIAC:  Mechanical heart sounds with LVAD hum present.  LUNGS:  Clear to auscultation bilaterally.  ABDOMEN:  Soft   LVAD exit site:  Dressing dry and intact.  EXTREMITIES:  Warm and dry NEUROLOGIC:   Alert and oriented x 3.     Telemetry   SR   Labs  Basic Metabolic Panel: Recent Labs  Lab 02/09/24 0111 02/10/24 0217 02/11/24 0220 02/12/24 1119 02/13/24 0239  NA 135 137 135 137 137  K 4.8 4.6 4.7 4.6 4.5  CL 96* 98 96* 100 99  CO2 32 33* 32 27 31  GLUCOSE 80 63* 108* 100* 91  BUN 38* 42* 43* 37* 42*  CREATININE 1.28* 1.43* 1.39* 1.24* 1.40*  CALCIUM  9.2 9.4 9.5 9.2 9.1    Liver Function Tests: No results for input(s): AST, ALT, ALKPHOS, BILITOT, PROT, ALBUMIN  in the last 168 hours. No results for input(s): LIPASE, AMYLASE in the last 168 hours. No results for input(s): AMMONIA in the last 168 hours.  CBC: Recent Labs  Lab 02/07/24 0148 02/08/24 0201 02/09/24 0111 02/10/24 0217 02/11/24 0220  WBC 3.8* 3.0* 3.4* 3.5* 6.5  HGB 11.4* 12.1 11.4* 11.5* 12.0  HCT 34.3* 37.0 35.2* 35.4* 37.2  MCV 91.0 91.1 91.9 91.2 92.3  PLT 111* 123* 119* 120* 145*    INR: Recent Labs  Lab 02/09/24 0111 02/10/24 0217 02/11/24 0220 02/12/24 0201 02/13/24 0239  INR 1.2 1.1 1.1 1.1 1.0    Medications:    Scheduled Medications:  atorvastatin   20 mg Oral q1800   digoxin   0.125 mg Oral Daily   gabapentin   300 mg Oral BID   linezolid   600 mg Oral Q12H   losartan   50 mg Oral Daily   pantoprazole   40 mg Oral Daily   spironolactone   25 mg Oral Daily   torsemide   40 mg Oral Daily    Infusions:  ampicillin -sulbactam (UNASYN ) IV 3 g (02/13/24 0929)   heparin  500 Units/hr (02/13/24 0421)    PRN Medications: acetaminophen , albuterol , HYDROmorphone  (DILAUDID ) injection, lip balm, ondansetron  (ZOFRAN ) IV, oxyCODONE -acetaminophen , traMADol   Assessment/Plan:    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  Related to poor outpatient dressing care.   - CT abdomen pelvis with contrast - unremarkable except for R breast mass (see below) -Blood Cx- NGTD  - Continue IV unasyn .+ Linezolid   - Wound Cx with few proteus  mirabilis  few diphtheroids.  - S/p post I&D with Dr. Daniel 1/20.  - Daily dressing changes.  -ID appreciated  Chronic systolic HF with biventricular dysfunction/stage D cardiomyopathy s/p HM3 07/24/2023 with concurrent MVR - Continue speed at 6000 RPM, stable - NYHA II. Appears euvolemic. Continue torsemide  40 mg daily.  - - VAD MAP 80-90 . Continue losartan  50 mg daily - Continue digoxin  0.125 mg daily, last dig level <0.6 - Continue spironolactone  25mg  daily - INR 1.0. Continue heparin  drip.  Discussed with PharmD.  -LDH stable.    Severe MR s/p MVR - Echo 4/25 with severe MR  - TEER on 07/09/23 complicated by worsening MR - S/p MVR   Hx of PE/DVT w/ IVC filter - Evaluated at [facility] where thrombophilia work-up was negative (Lupus inhibitor, anticardiolipin antibodies, and anti beta-2  glycoprotein antibodies were negative; protein C activity 85%, protein S activity 76%, factor V Leiden, and prothrombin gene mutation ) - IVC filter in place; could not be removed due to significant thrombus burden on filter at [facility].  - Holding warfarin as above - Continue hep gtt.    Obesity  - BMI significantly improved, will need GLP-1 later if considering txp - Body mass index is 36.57 kg/m.   CAD - LHC in 5/23 w/ nonobstructive CAD involving RCA - Inferior WMA on most recent echo - no chest pain.    Tobacco use - understands need to quit   Right breast mass on CT - malignancy vs infection - will need mammogram - Has had R breast biopsy in 2022 which was benign    I reviewed the LVAD parameters from today, and compared the results to the patient's prior recorded data.  No programming changes were made.  The LVAD is functioning within specified parameters.  The patient performs LVAD self-test daily.  LVAD interrogation was negative for any significant power changes, alarms or PI events/speed drops.  LVAD equipment check completed and is in good working order.  Back-up equipment present.   LVAD  education done on emergency procedures and precautions and reviewed exit site care.  Length of Stay: 7  [name], NP 02/13/2024, 9:42 AM  VAD Team --- VAD ISSUES ONLY--- Pager [phone] (7am - 7am)  Advanced Heart Failure Team  Pager [phone] (M-F; 7a - 5p)   Please visit [email or web address]: For overnight coverage please call cardiology fellow first. If fellow not available call Shock/ECMO MD on call.  For ECMO / Mechanical Support (Impella, IABP, LVAD) issues call Shock / ECMO MD on call.    "

## 2024-02-13 NOTE — Progress Notes (Signed)
 PHARMACY - ANTICOAGULATION CONSULT NOTE  Pharmacy Consult for heparin  + resume warfarin Indication: LVAD HM3 with hx PE/DVT  Allergies[1]  Patient Measurements: Height: 5' 10 (177.8 cm) Weight: 115.6 kg (254 lb 13.6 oz) IBW/kg (Calculated) : 68.5 HEPARIN  DW (KG): 94  Vital Signs: Temp: 98.2 F (36.8 C) (01/23 0737) Temp Source: Oral (01/23 0737) BP: 99/86 (01/23 0737) Pulse Rate: 61 (01/23 0737)  Labs: Recent Labs    02/11/24 0220 02/12/24 0201 02/12/24 1119 02/13/24 0239  HGB 12.0  --   --   --   HCT 37.2  --   --   --   PLT 145*  --   --   --   LABPROT 14.7 14.3  --  14.0  INR 1.1 1.1  --  1.0  HEPARINUNFRC  --  <0.10*  --  <0.10*  CREATININE 1.39*  --  1.24* 1.40*    Estimated Creatinine Clearance: 68.5 mL/min (A) (by C-G formula based on SCr of 1.4 mg/dL (H)).   Medical History: Past Medical History:  Diagnosis Date   Anemia    Blood transfusion without reported diagnosis    CAD (coronary artery disease)    Nonobstructive   Closed left ankle fracture    August 30 2012   Clotting disorder    DVT (deep venous thrombosis) (HCC)    L leg   Fibroid tumor    HFrEF (heart failure with reduced ejection fraction) (HCC)    Hidradenitis    Hypertension    LVAD (left ventricular assist device) present [facility])    Mitral regurgitation    Myocardial infarction (HCC)    NICM (nonischemic cardiomyopathy) (HCC)    NSVT (nonsustained ventricular tachycardia) (HCC)    Obesity    OSA on CPAP    Pulmonary embolism (HCC) 04/2013   Sleep apnea     Medications:  Scheduled:   atorvastatin   20 mg Oral q1800   digoxin   0.125 mg Oral Daily   gabapentin   300 mg Oral BID   linezolid   600 mg Oral Q12H   losartan   50 mg Oral Daily   pantoprazole   40 mg Oral Daily   spironolactone   25 mg Oral Daily   torsemide   40 mg Oral Daily    Assessment: 37 yof presenting with concern for driveline infection - plan to admit for IV antibiotics and evaluation for possible I&D. On  warfarin PTA for LVAD + hx DVT/PE.   PTA warfarin regimen is  5 mg daily except 7.5 mg Tues/Sat at last clinic appointment.  Heparin  was restarted at 500 units/hr - heparin  level this morning is <0.1 as expected. INR today is low at 1 as expected with warfarin being held. Hgb 12, plt 145 -  LDH 164 (stable). Having some oozing during dressing change yesterday - still continuing today.  Per discussion with AHF MD, plan to continue low dose heparin  infusion while INR remains <1.5.   Goal of Therapy:  INR 2-3 Heparin  level <0.3 - fixed rate unless directed by MD Monitor platelets by anticoagulation protocol: Yes   Plan:  Continue heparin  infusion at 500 units/hr fixed rate Resume warfarin tonight with 5 mg x 1 Monitor daily INR, HL, CBC, and for s/sx of bleeding  Thank you for allowing pharmacy to participate in this patient's care,  Harlene Denna Berdine JONETTA ARABELLA, [facility] Clinical Pharmacist  02/13/2024 12:31 PM   [facility] pharmacy phone numbers are listed on [email or web address]       [1]  Allergies Allergen Reactions  Sulfa  Antibiotics Itching, Swelling and Rash    Lip swelling

## 2024-02-13 NOTE — Telephone Encounter (Signed)
 Pharmacy Patient Advocate Encounter  Insurance verification completed.    The patient is insured through [facility]. Patient has Medicare and is not eligible for a copay card, but may be able to apply for patient assistance or Medicare RX Payment Plan (Patient Must reach out to their plan, if eligible for payment plan), if available.    Ran test claim for Linezolid  600mg  tab and the current 14 day co-pay is $1.60.   This test claim was processed through  [facility]- copay amounts may vary at other pharmacies due to pharmacy/plan contracts, or as the patient moves through the different stages of their insurance plan.

## 2024-02-13 NOTE — Progress Notes (Signed)
 "        [facility]  Date of Admission:  02/06/2024      Total days of antibiotics 7   Unasyn    Linezolid          ASSESSMENT: [name] [name] is a 49 y.o. 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 Taken to OR for debridement 02/10/24.  3 operative specimens reviewed: Proteus Mirabilis growing x 3 sites with one growing out a corynebacterium species (I believe from the velour culture and may reflect contaminant/colonizer). Will continue linezolid  600 mg BID to cover but would not suspect this will need to continue beyond 10-14d. Follow for further speciation. Continue unasyn  - proteus is susceptible to this while we ensure no eikenella recovered. Not ideal for home use however so once final will adjust once all is final.  - Ceftriaxone  for ease of home use and once daily injection.  - continue linezolid  BID for corynebacterium species on 1 culture - EOT: 02/26/24 - BCx 1/16 without growth final  - PICC line to be placed when HF team determines. Single lumen at any time is fine  Chronic breast abscess, right - CT scan Right breast mass measuring 4.6 x 3.4 cm with overlying skin thickening, concerning for malignancy or infection/abscess. On exam she has a small open ulcerated fistulous track noted around the areola 2 o'clock position.  There is some clear milky fluid; no granules.  The tissue is cool to the touch but has a woodiness to palpation.  R breast biopsy in 2022 which was benign  This could represent chronic actinomyces infection which makes sense with her dental problems, other oral flora in LVAD wound and the fact that the drainage improved on the augmentin  outpatient. This can also mimic malignancy as well.  - will need follow up likely with breast surgeon.   - next step would be a tissue biopsy and culture (would need to hold cultures longer for consideration of actino given difficult to grow).    Vascular Access -  -PICC will be needed - defer timing to HF team    Discharge Planning / Coordination of Care -  -Outpatient antibiotics finalized below -Discussed with primary, ID pharmacy & Dr. Fleeta Rothman    Medication Management -  Creatinine stable - no antibiotic dose changes recommended  - CRP / ESR reviewed  - she is on warfarin at baseline - will need review with abx choice for any possible INR interactions - Follow platelets now on linezolid     Isolation recommendations -  Standard / universal   ID will sign off as of today - please call back with any questions/concerns or if we can be of further assistance.     PLAN: - OPAT as outlined below  - PICC line timing per HF team  - I pended the linezolid  rx in the D/C summary  - breast mass / abscess noted - ? Actinomyces infection that can mimic malignancy. OP follow up in process.    OPAT ORDERS:  Diagnosis: LVAD exit site infection   Culture Result: Proteus Mirabilis x 3 cx (I-cefaz) and corynebacterium sps x 1 cx  Allergies[1]   Discharge antibiotics to be given via PICC line:  Cefriaxone 2 gm IV Q24h   + ORAL Linezolid  600 mg BID x 2  weeks total rx pended to TOC    Duration: 6 weeks from OR   End Date: March 3rd   [facility] Care Per Protocol with [employer] Use: Home health RN for IV administration and teaching, line care and labs.    Labs weekly while on IV antibiotics: __ CBC with differential __ BMP **TWICE WEEKLY ON VANCOMYCIN   _x_ CMP _x_ CRP _x_ ESR __ Vancomycin  trough TWICE WEEKLY __ CK  __ Please pull PIC at completion of IV antibiotics _x_ Please leave PIC in place until doctor has seen patient or been notified  Fax weekly labs to [phone]  Clinic Follow Up Appt: 03/10/24 @ 2:30 pm with Cathlyn July, NP  03/29/24 @ 9:45 am with Dr. Fleeta Rothman     Principal Problem:   Infection associated with driveline of left ventricular assist device (LVAD) Active Problems:   Nonadherence to medical treatment   Valvular heart disease   Acute on chronic congestive heart failure (HCC)   Proteus mirabilis infection   Encounter for medication monitoring    atorvastatin   20 mg Oral q1800   digoxin   0.125 mg Oral Daily   gabapentin   300 mg Oral BID   linezolid   600 mg Oral Q12H   losartan   50 mg Oral Daily   pantoprazole   40 mg Oral Daily   spironolactone   25 mg Oral Daily   torsemide   40 mg Oral Daily    SUBJECTIVE: Doing well w/o concerns.   Review of Systems: Review of Systems  Constitutional:  Negative for chills and fever.  HENT:  Negative for tinnitus.   Eyes:  Negative for blurred vision and photophobia.  Respiratory:  Negative for cough and sputum production.   Cardiovascular:  Negative for chest pain.  Gastrointestinal:  Negative for diarrhea, nausea and vomiting.  Genitourinary:  Negative for dysuria.  Skin:  Negative for rash.  Neurological:  Negative for headaches.    Allergies[2]  OBJECTIVE: Vitals:   02/12/24 2240 02/13/24 0400 02/13/24 0412 02/13/24 0737  BP: 95/66 97/76  99/86  Pulse:    61  Resp: 19  16 18   Temp: 98 F (36.7 C)  98 F (36.7 C) 98.2 F (36.8 C)  TempSrc: Oral  Oral Oral  SpO2: 96%  99% 99%  Weight:   115.6 kg   Height:       Body mass index is 36.57 kg/m.  Physical Exam Constitutional:      Appearance: Normal appearance. She is not ill-appearing.  HENT:     Mouth/Throat:     Mouth: Mucous membranes are moist.     Pharynx: Oropharynx is clear.  Eyes:     General: No scleral icterus. Cardiovascular:     Rate and Rhythm: Normal rate.  Pulmonary:     Effort: Pulmonary effort is normal.  Chest:       Comments: Small open ulcerated fistulous track noted around the areola 2 o'clock position.  There is some clear milky fluid nothing granular in appearance.  The tissue is to the  touch but has a woodiness to palpation.  Skin:    General: Skin is warm and dry.     Findings: No rash.  Neurological:     Mental Status: She is oriented to person, place, and time.  Psychiatric:        Mood and Affect: Mood normal.        Thought Content: Thought content normal.     Lab Results Lab Results  Component Value Date  WBC 6.5 02/11/2024   HGB 12.0 02/11/2024   HCT 37.2 02/11/2024   MCV 92.3 02/11/2024   PLT 145 (L) 02/11/2024    Lab Results  Component Value Date   CREATININE 1.40 (H) 02/13/2024   BUN 42 (H) 02/13/2024   NA 137 02/13/2024   K 4.5 02/13/2024   CL 99 02/13/2024   CO2 31 02/13/2024    Lab Results  Component Value Date   ALT 12 01/05/2024   AST 24 01/05/2024   ALKPHOS 83 01/05/2024   BILITOT 0.8 01/05/2024     Microbiology: Recent Results (from the past 240 hours)  Culture, blood (Routine X 2) w Reflex to ID Panel     Status: None   Collection Time: 02/06/24 11:48 AM   Specimen: BLOOD  Result Value Ref Range Status   Specimen Description BLOOD BLOOD RIGHT HAND  Final   Special Requests   Final    BOTTLES DRAWN AEROBIC AND ANAEROBIC Blood Culture adequate volume   Culture   Final    NO GROWTH 5 DAYS Performed at [facility], [address], [place], [place] [address]    Report Status 02/11/2024 FINAL  Final  Culture, blood (Routine X 2) w Reflex to ID Panel     Status: None   Collection Time: 02/06/24 12:00 PM   Specimen: BLOOD  Result Value Ref Range Status   Specimen Description BLOOD LEFT ANTECUBITAL  Final   Special Requests   Final    BOTTLES DRAWN AEROBIC AND ANAEROBIC Blood Culture adequate volume   Culture   Final    NO GROWTH 5 DAYS Performed at [facility], [address] [place], [place] [address]    Report Status 02/11/2024 FINAL  Final  MRSA Next Gen by PCR, Nasal     Status: None   Collection Time: 02/06/24  4:07 PM   Specimen: Nasal Mucosa; Nasal Swab  Result Value Ref Range Status   MRSA by PCR  Next Gen NOT DETECTED NOT DETECTED Final    Comment: (NOTE) The GeneXpert MRSA Assay (FDA approved for NASAL specimens only), is one component of a comprehensive MRSA colonization surveillance program. It is not intended to diagnose MRSA infection nor to guide or monitor treatment for MRSA infections. Test performance is not FDA approved in patients less than 48 years old. Performed at [facility], [address] [place], [place] [address]   Surgical pcr screen     Status: None   Collection Time: 02/10/24  4:47 AM   Specimen: Nasal Mucosa; Nasal Swab  Result Value Ref Range Status   MRSA, PCR NEGATIVE NEGATIVE Final   Staphylococcus aureus NEGATIVE NEGATIVE Final    Comment: (NOTE) The Xpert SA Assay (FDA approved for NASAL specimens in patients 75 years of age and older), is one component of a comprehensive surveillance program. It is not intended to diagnose infection nor to guide or monitor treatment. Performed at [facility], [address] [place], [place] [address]   Aerobic/Anaerobic Culture w Gram Stain (surgical/deep wound)     Status: None (Preliminary result)   Collection Time: 02/10/24 10:47 AM   Specimen: Abdomen; Wound  Result Value Ref Range Status   Specimen Description ABDOMEN  Final   Special Requests DRIVE LINE INFECTION  Final   Gram Stain   Final    FEW WBC PRESENT, PREDOMINANTLY PMN NO ORGANISMS SEEN    Culture   Final    RARE PROTEUS MIRABILIS  NO ANAEROBES ISOLATED; CULTURE IN PROGRESS FOR 5 DAYS SUSCEPTIBILITIES PERFORMED ON PREVIOUS CULTURE WITHIN THE LAST 5 DAYS. Performed at [facility], [address] [place], [place] [address]    Report Status PENDING  Incomplete  Aerobic/Anaerobic Culture w Gram Stain (surgical/deep wound)     Status: None (Preliminary result)   Collection Time: 02/10/24 10:51 AM   Specimen: Abdomen; Wound  Result Value Ref Range Status   Specimen Description ABDOMEN  Final   Special Requests DRIVE  LINE TISSUE  Final   Gram Stain   Final    FEW WBC PRESENT, PREDOMINANTLY PMN RARE GRAM POSITIVE COCCI    Culture   Final    RARE PROTEUS MIRABILIS NO ANAEROBES ISOLATED; CULTURE IN PROGRESS FOR 5 DAYS SUSCEPTIBILITIES PERFORMED ON PREVIOUS CULTURE WITHIN THE LAST 5 DAYS. Performed at [facility], [address], [place], [place] [address]    Report Status PENDING  Incomplete  Aerobic/Anaerobic Culture w Gram Stain (surgical/deep wound)     Status: None (Preliminary result)   Collection Time: 02/10/24 10:58 AM   Specimen: Abdomen; Wound  Result Value Ref Range Status   Specimen Description ABDOMEN  Final   Special Requests DRIVE LINE VELOUR  Final   Gram Stain   Final    RARE WBC SEEN NO ORGANISMS SEEN Performed at [facility], [address], [place], [place] [address]    Culture   Final    FEW PROTEUS MIRABILIS FEW DIPHTHEROIDS(CORYNEBACTERIUM SPECIES) Standardized susceptibility testing for this organism is not available. NO ANAEROBES ISOLATED; CULTURE IN PROGRESS FOR 5 DAYS    Report Status PENDING  Incomplete   Organism ID, Bacteria PROTEUS MIRABILIS  Final      Susceptibility   Proteus mirabilis - MIC*    AMPICILLIN  <=2 SENSITIVE Sensitive     CEFAZOLIN  (NON-URINE) 4 INTERMEDIATE Intermediate     CEFEPIME  0.5 SENSITIVE Sensitive     ERTAPENEM <=0.12 SENSITIVE Sensitive     CEFTRIAXONE  <=0.25 SENSITIVE Sensitive     CIPROFLOXACIN <=0.06 SENSITIVE Sensitive     GENTAMICIN <=1 SENSITIVE Sensitive     MEROPENEM <=0.25 SENSITIVE Sensitive     TRIMETH /SULFA  <=20 SENSITIVE Sensitive     AMPICILLIN /SULBACTAM <=2 SENSITIVE Sensitive     PIP/TAZO Value in next row Sensitive      <=4 SENSITIVEThis is a modified FDA-approved test that has been validated and its performance characteristics determined by the reporting laboratory.  This laboratory is certified under the Clinical Laboratory Improvement Amendments CLIA as qualified to perform high complexity clinical  laboratory testing.    * FEW PROTEUS MIRABILIS    Corean Fireman, MSN, NP-C [facility]  [email or web address] Pager: [phone] Office: [phone] [facility] Main Line: [phone] *Secure Chat Communication Welcome  I personally spent a total of 55 minutes in the care of the patient today including preparing to see the patient, getting/reviewing separately obtained history, performing a medically appropriate exam/evaluation, counseling and educating, placing orders, referring and communicating with other health care professionals, documenting clinical information in the EHR, independently interpreting results, communicating results, and coordinating care.      [1]  Allergies Allergen Reactions   Sulfa  Antibiotics Itching, Swelling and Rash    Lip swelling  [2]  Allergies Allergen Reactions   Sulfa  Antibiotics Itching, Swelling and Rash    Lip swelling   "

## 2024-02-13 NOTE — Progress Notes (Signed)
 PHARMACY CONSULT NOTE FOR:  OUTPATIENT  PARENTERAL ANTIBIOTIC THERAPY (OPAT)  Indication: LVAD driveline infection with Proteus mirabilis and Corynebacterium on cultures Regimen: Ceftriaxone  2g IV every 24 hours  End date: 03/23/2024  Additionally, linezolid  600 mg PO every 12 hours (end of therapy 02/26/2024)  IV antibiotic discharge orders are pended. To discharging provider:  please sign these orders via discharge navigator,  Select New Orders & click on the button choice - Manage This Unsigned Work.    Thank you for allowing pharmacy to be a part of this patient's care.  Feliciano Close, PharmD PGY2 Infectious Diseases Pharmacy Resident

## 2024-02-13 NOTE — Progress Notes (Signed)
 LVAD Coordinator Rounding Note:  Admitted 02/06/24 from VAD clinic for drive line infection to advanced heart failure service  HM III LVAD implanted on 07/24/23 by Dr. Lucas under destination criteria.  Pt laying in bed on my arrival. Denies complaints. Dressing changed today at bedside. See below for documentation.  PRN pain medication administered prior to wound packing.   Site no longer bleeding with packing removal. Rounding team made aware. Plan to continue low dose heparin  infusion while INR remains <1.5. Will restart Coumadin  tonight per Dr Zenaida.   ID following. Plan for IV Ceftriaxone  once daily for 6 weeks, then suppression. Will do 2 weeks PO Linezolid  to cover the Corynebacterium from the culture obtained off the velour (only in 1 so probably more of a colonizer). Will need PICC placed closer to discharge.  Discussed importance of compliance with wound care with pt at bedside today. Her friend Skippy is willing to be trained on dressing change next week. Will touch base with her Monday to set up teaching time.   Vital signs: Temp: 98.2 HR: 82 Doppler Pressure: 90 Automatic BP: 99/86 (93) O2 Sat: 99% on RA Wt: 251.7>250.6>247.8>252.4>254.8  lbs  LVAD interrogation reveals:  Speed: 6000 Flow: 4.5 Power: 4.7 w PI: 3.4  Alarms: none Events: 1 PI so far today Hematocrit: 38  Fixed speed: 6000 Low speed limit: 5700  Drive Line: Pt premedicated by bedside RN prior to wound care. Existing VAD dressing/packing removed and site care performed using sterile technique. Drive line exit site/incision cleaned with CHG swab x 2, allowed to dry. Packed incision with 3/4 of a large VASHE moistened kerlex. Covered Silverlon patch, with several dry 4 x 4, and an ABD pad. Red rubber with suture intact directly at exit site. Incisional sutures intact- skin well approximated. Exit site partially incorporated. Velour removed in OR, driveline significantly exposed at exit site. Moderate amount of  serosanguinous drainage on previous dressing. No oozing noted with packing removal. Tenderness present with packing. No foul odor or rash noted. Anchor reapplied. Daily dressing changes by bedside nurse or VAD coordinator. Next dressing change due 02/14/23.      Labs:  LDH trend: 179>196>181>164  INR trend: 1.2>1.1>1.1>1.0  Anticoagulation Plan: - INR Goal: 2.0-3.0 - ASA Dose: n/a - Coumadin  dosing per pharmacy  Arrythmias:  HX: NSVT on p.o. amiodarone   Infection: 01/04/25>> wound cx >> few eikenella corrodens 02/06/24>> blood cultures >> no growth 3 days 02/10/24>>OR wound cx>>rare proteus mirabilis 02/10/24>>OR fungal cx>>pending 02/10/24>> OR wound cx #2>> rare proteus mirabilis 02/10/24>> OR fungal cx #2>>pending 02/10/24>> OR drive line velour>> few proteus mirabilis, few diptheroids (corynebacterium) 02/10/24>> OR drive line velour fungal cx>> pending  Drips:  Heparin  500 units/hr  Adverse Events on VAD:  Plan/Recommendations:  1. Page VAD coordinator with any VAD alarms or equipment issues. 2. Daily drive line dressing changes per bedside RN or VAD coordinator  [name] RN VAD Coordinator  Office: [phone]  24/7 Pager: [phone]

## 2024-02-13 NOTE — Plan of Care (Signed)
" °  Problem: Education: Goal: Patient will understand all VAD equipment and how it functions Outcome: Progressing Goal: Patient will be able to verbalize current INR target range and antiplatelet therapy for discharge home Outcome: Progressing   Problem: Cardiac: Goal: LVAD will function as expected and patient will experience no clinical alarms Outcome: Progressing   Problem: Education: Goal: Knowledge of General Education information will improve Description: Including pain rating scale, medication(s)/side effects and non-pharmacologic comfort measures Outcome: Progressing   Problem: Health Behavior/Discharge Planning: Goal: Ability to manage health-related needs will improve Outcome: Progressing   Problem: Clinical Measurements: Goal: Ability to maintain clinical measurements within normal limits will improve Outcome: Progressing Goal: Will remain free from infection Outcome: Progressing Goal: Diagnostic test results will improve Outcome: Progressing Goal: Respiratory complications will improve Outcome: Progressing Goal: Cardiovascular complication will be avoided Outcome: Progressing   Problem: Activity: Goal: Risk for activity intolerance will decrease Outcome: Progressing   Problem: Nutrition: Goal: Adequate nutrition will be maintained Outcome: Progressing   Problem: Coping: Goal: Level of anxiety will decrease Outcome: Progressing   Problem: Elimination: Goal: Will not experience complications related to bowel motility Outcome: Progressing Goal: Will not experience complications related to urinary retention Outcome: Progressing   Problem: Pain Managment: Goal: General experience of comfort will improve and/or be controlled Outcome: Progressing   Problem: Safety: Goal: Ability to remain free from injury will improve Outcome: Progressing   Problem: Skin Integrity: Goal: Risk for impaired skin integrity will decrease Outcome: Progressing   Problem:  Education: Goal: Knowledge of the prescribed therapeutic regimen will improve Outcome: Progressing   Problem: Bowel/Gastric: Goal: Gastrointestinal status for postoperative course will improve Outcome: Progressing   Problem: Cardiac: Goal: Ability to maintain an adequate cardiac output Outcome: Progressing Goal: Will show no evidence of cardiac arrhythmias Outcome: Progressing   Problem: Nutritional: Goal: Will attain and maintain optimal nutritional status Outcome: Progressing   Problem: Neurological: Goal: Will regain or maintain usual level of consciousness Outcome: Progressing   Problem: Clinical Measurements: Goal: Ability to maintain clinical measurements within normal limits Outcome: Progressing Goal: Postoperative complications will be avoided or minimized Outcome: Progressing   Problem: Respiratory: Goal: Will regain and/or maintain adequate ventilation Outcome: Progressing Goal: Respiratory status will improve Outcome: Progressing   Problem: Skin Integrity: Goal: Demonstrates signs of wound healing without infection Outcome: Progressing   Problem: Urinary Elimination: Goal: Will remain free from infection Outcome: Progressing Goal: Ability to achieve and maintain adequate urine output Outcome: Progressing   "

## 2024-02-13 NOTE — Hospital Course (Signed)
 [name] [name]

## 2024-02-14 LAB — CBC
HCT: 35 % — ABNORMAL LOW (ref 36.0–46.0)
Hemoglobin: 11.4 g/dL — ABNORMAL LOW (ref 12.0–15.0)
MCH: 30.2 pg (ref 26.0–34.0)
MCHC: 32.6 g/dL (ref 30.0–36.0)
MCV: 92.8 fL (ref 80.0–100.0)
Platelets: 121 10*3/uL — ABNORMAL LOW (ref 150–400)
RBC: 3.77 MIL/uL — ABNORMAL LOW (ref 3.87–5.11)
RDW: 15.6 % — ABNORMAL HIGH (ref 11.5–15.5)
WBC: 4.2 10*3/uL (ref 4.0–10.5)
nRBC: 0 % (ref 0.0–0.2)

## 2024-02-14 LAB — FUNGUS CULTURE WITH STAIN

## 2024-02-14 LAB — HEPARIN LEVEL (UNFRACTIONATED): Heparin Unfractionated: <0.1 [IU]/mL — ABNORMAL LOW (ref 0.30–0.70)

## 2024-02-14 LAB — BASIC METABOLIC PANEL WITH GFR
Anion gap: 8 (ref 5–15)
BUN: 42 mg/dL — ABNORMAL HIGH (ref 6–20)
CO2: 32 mmol/L (ref 22–32)
Calcium: 9.2 mg/dL (ref 8.9–10.3)
Chloride: 100 mmol/L (ref 98–111)
Creatinine, Ser: 1.39 mg/dL — ABNORMAL HIGH (ref 0.44–1.00)
GFR, Estimated: 47 mL/min — ABNORMAL LOW
Glucose, Bld: 86 mg/dL (ref 70–99)
Potassium: 4.7 mmol/L (ref 3.5–5.1)
Sodium: 139 mmol/L (ref 135–145)

## 2024-02-14 LAB — PROTIME-INR
INR: 1.1 (ref 0.8–1.2)
Prothrombin Time: 14.6 s (ref 11.4–15.2)

## 2024-02-14 LAB — FUNGUS CULTURE RESULT

## 2024-02-14 LAB — LACTATE DEHYDROGENASE: LDH: 150 U/L (ref 105–235)

## 2024-02-14 MED ORDER — WARFARIN SODIUM 7.5 MG PO TABS
7.5000 mg | ORAL_TABLET | Freq: Once | ORAL | Status: AC
  Administered 2024-02-14: 7.5 mg via ORAL
  Filled 2024-02-14: qty 1

## 2024-02-14 MED ORDER — SACUBITRIL-VALSARTAN 24-26 MG PO TABS
1.0000 | ORAL_TABLET | Freq: Two times a day (BID) | ORAL | Status: DC
  Administered 2024-02-15 – 2024-02-22 (×15): 1 via ORAL
  Filled 2024-02-14 (×15): qty 1

## 2024-02-14 MED ORDER — TORSEMIDE 20 MG PO TABS
20.0000 mg | ORAL_TABLET | Freq: Every day | ORAL | Status: DC
  Administered 2024-02-15 – 2024-02-22 (×8): 20 mg via ORAL
  Filled 2024-02-14 (×8): qty 1

## 2024-02-14 MED ORDER — MORPHINE SULFATE (PF) 2 MG/ML IV SOLN
2.0000 mg | Freq: Every day | INTRAVENOUS | Status: DC | PRN
  Administered 2024-02-14 – 2024-02-18 (×4): 2 mg via INTRAVENOUS
  Filled 2024-02-14 (×5): qty 1

## 2024-02-14 NOTE — Progress Notes (Signed)
 PHARMACY - ANTICOAGULATION CONSULT NOTE  Pharmacy Consult for heparin  + resume warfarin Indication: LVAD HM3 with hx PE/DVT  Allergies[1]  Patient Measurements: Height: 5' 10 (177.8 cm) Weight: 114.8 kg (253 lb 1.4 oz) IBW/kg (Calculated) : 68.5 HEPARIN  DW (KG): 94  Vital Signs: Temp: 98 F (36.7 C) (01/24 0400) Temp Source: Oral (01/24 0400) BP: 83/57 (01/24 0400) Pulse Rate: 61 (01/24 0400)  Labs: Recent Labs    02/12/24 0201 02/12/24 1119 02/13/24 0239 02/14/24 0230  HGB  --   --   --  11.4*  HCT  --   --   --  35.0*  PLT  --   --   --  121*  LABPROT 14.3  --  14.0 14.6  INR 1.1  --  1.0 1.1  HEPARINUNFRC <0.10*  --  <0.10* <0.10*  CREATININE  --  1.24* 1.40* 1.39*    Estimated Creatinine Clearance: 68.7 mL/min (A) (by C-G formula based on SCr of 1.39 mg/dL (H)).   Medical History: Past Medical History:  Diagnosis Date   Anemia    Blood transfusion without reported diagnosis    CAD (coronary artery disease)    Nonobstructive   Closed left ankle fracture    August 30 2012   Clotting disorder    DVT (deep venous thrombosis) (HCC)    L leg   Fibroid tumor    HFrEF (heart failure with reduced ejection fraction) (HCC)    Hidradenitis    Hypertension    LVAD (left ventricular assist device) present [facility])    Mitral regurgitation    Myocardial infarction (HCC)    NICM (nonischemic cardiomyopathy) (HCC)    NSVT (nonsustained ventricular tachycardia) (HCC)    Obesity    OSA on CPAP    Pulmonary embolism (HCC) 04/2013   Sleep apnea     Medications:  Scheduled:   atorvastatin   20 mg Oral q1800   digoxin   0.125 mg Oral Daily   gabapentin   300 mg Oral BID   linezolid   600 mg Oral Q12H   losartan   50 mg Oral Daily   pantoprazole   40 mg Oral Daily   spironolactone   25 mg Oral Daily   torsemide   40 mg Oral Daily   Warfarin - Pharmacist Dosing Inpatient   Does not apply q1600    Assessment: 61 yof presenting with concern for driveline infection - plan  to admit for IV antibiotics and evaluation for possible I&D. On warfarin PTA for LVAD + hx DVT/PE.   PTA warfarin regimen is 5 mg daily except 7.5 mg Tues/Sat at last clinic appointment.  Heparin  running at 500 units/hr - heparin  level this morning is <0.1 as expected with low dose heparin . INR today is 1.1 from 1.0 yesterday. Hgb 11.4, plt 121 -  LDH 150 (stable). Previously with oozing during dressing changes, no bleeding today per RN.  Per discussion with AHF MD, plan to continue low dose heparin  infusion while INR remains <1.5.   Goal of Therapy:  INR 2-3 Heparin  level <0.3 - fixed rate unless directed by MD Monitor platelets by anticoagulation protocol: Yes   Plan:  Continue heparin  infusion at 500 units/hr fixed rate Warfarin tonight with 7.5 mg x 1 (home regimen) Monitor daily INR, HL, CBC, and for s/sx of bleeding  Thank you for allowing pharmacy to be a part of this patients care.   Nidia Schaffer, PharmD, BCPS PGY2 Cardiology Pharmacy Resident  Please check AMION for all [facility] Pharmacy phone numbers After 10:00 PM,  call Main Pharmacy [phone]  02/14/2024 7:00 AM     [1]  Allergies Allergen Reactions   Sulfa  Antibiotics Itching, Swelling and Rash    Lip swelling

## 2024-02-14 NOTE — Progress Notes (Signed)
 "  Advanced Heart Failure VAD Team Note  PCP-Cardiologist: None  Chief Complaint: LVAD infection  Patient Profile   [name] [name] is a 49 y.o. female with hx of saddle PE/DVT s/p IVC filter (2015), HFrEF/NICM, pancytopenia, nonobstructive CAD, morbid obesity, noncompliance, pancytopenia (followed by hematology), tobacco use. Admitted with LVAD DL infection  Significant events:    Admitted 1/16 for DL infection 8/79/73: S/p OR for DL debridement  8/77 : ID added linezolid . Continued on ancef .   Subjective:    Continues on Unasyn + Linezolid . Wound cx with rare proteus mirabilis+ few diphtheroids. Placed morphine  prn for dressing changes. No complaints today otherwise. Will try to walk the halls after dressing change.     LVAD INTERROGATION:  HeartMate 3 VAD Equipment Check Pump Speed (RPM): 6050 RPM Pump Flow (LPM): 4.2 Power (Watts): 5 Watts Pulsatility Index: 3.1 Fixed Speed Limit: 6000 rpm Low Speed Limit: 5700 rpm Alarms: No alarms Auscultated: Normal expected humming Power Module Self-Test (Daily): Done System Controller Self-Test: Passed Patient Battery Source: [facility] / Wall unit Emergency Equipment at Bedside: Yes  No PIs    Objective:   Vital Signs:   Temp:  [97.8 F (36.6 C)-98.4 F (36.9 C)] 97.8 F (36.6 C) (01/24 1052) Pulse Rate:  [58-68] 60 (01/24 1052) Resp:  [15-18] 17 (01/24 1052) BP: (77-105)/(52-84) 103/84 (01/24 1052) SpO2:  [98 %-99 %] 99 % (01/24 1052) Weight:  [114.8 kg] 114.8 kg (01/24 0420) Last BM Date : 02/10/24 Mean arterial Pressure 80s-90s  Intake/Output:   Intake/Output Summary (Last 24 hours) at 02/14/2024 1349 Last data filed at 02/14/2024 1236 Gross per 24 hour  Intake 480 ml  Output 2650 ml  Net -2170 ml    Physical Exam  Physical Exam: General:  Well appearing. No resp difficulty Cor: Mechanical heart sounds with LVAD hum present. JVP mildly elevated, trace edema Lungs: Normal WOB Abdomen: soft, nontender, nondistended.   Driveline: C/D/I; securement device intact  Neuro: alert & orientedx3, cranial nerves grossly intact. moves all 4 extremities w/o difficulty.   Telemetry   SR   Labs  Basic Metabolic Panel: Recent Labs  Lab 02/10/24 0217 02/11/24 0220 02/12/24 1119 02/13/24 0239 02/14/24 0230  NA 137 135 137 137 139  K 4.6 4.7 4.6 4.5 4.7  CL 98 96* 100 99 100  CO2 33* 32 27 31 32  GLUCOSE 63* 108* 100* 91 86  BUN 42* 43* 37* 42* 42*  CREATININE 1.43* 1.39* 1.24* 1.40* 1.39*  CALCIUM  9.4 9.5 9.2 9.1 9.2    Liver Function Tests: No results for input(s): AST, ALT, ALKPHOS, BILITOT, PROT, ALBUMIN  in the last 168 hours. No results for input(s): LIPASE, AMYLASE in the last 168 hours. No results for input(s): AMMONIA in the last 168 hours.  CBC: Recent Labs  Lab 02/08/24 0201 02/09/24 0111 02/10/24 0217 02/11/24 0220 02/14/24 0230  WBC 3.0* 3.4* 3.5* 6.5 4.2  HGB 12.1 11.4* 11.5* 12.0 11.4*  HCT 37.0 35.2* 35.4* 37.2 35.0*  MCV 91.1 91.9 91.2 92.3 92.8  PLT 123* 119* 120* 145* 121*    INR: Recent Labs  Lab 02/10/24 0217 02/11/24 0220 02/12/24 0201 02/13/24 0239 02/14/24 0230  INR 1.1 1.1 1.1 1.0 1.1    Medications:    Scheduled Medications:  atorvastatin   20 mg Oral q1800   digoxin   0.125 mg Oral Daily   gabapentin   300 mg Oral BID   linezolid   600 mg Oral Q12H   losartan   50 mg Oral Daily   pantoprazole   40 mg Oral Daily   spironolactone   25 mg Oral Daily   torsemide   40 mg Oral Daily   warfarin  7.5 mg Oral ONCE-1600   Warfarin - Pharmacist Dosing Inpatient   Does not apply q1600    Infusions:  cefTRIAXone  (ROCEPHIN )  IV 2 g (02/14/24 1325)   heparin  500 Units/hr (02/13/24 0421)    PRN Medications: acetaminophen , albuterol , HYDROmorphone  (DILAUDID ) injection, lip balm, morphine  injection, ondansetron  (ZOFRAN ) IV, oxyCODONE -acetaminophen , traMADol   Assessment/Plan:    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  Related to poor outpatient dressing care.   - CT abdomen pelvis with contrast - unremarkable except for R breast mass (see below) -Blood Cx- NGTD  - Continue IV ceftriaxone .+ Linezolid   - Wound Cx with few proteus mirabilis, few diphtheroids.  - S/p post I&D with Dr. Daniel 1/20.  - Daily dressing changes.  -ID appreciated  Chronic systolic HF with biventricular dysfunction/stage D cardiomyopathy s/p HM3 07/24/2023 with concurrent MVR - Continue speed at 6000 RPM, stable - NYHA II. Appears euvolemic. Decrease torsemide  to 20mg  daily, start entresto  24/26mg  BID - - VAD MAP 80-90 - Continue digoxin  0.125 mg daily, last dig level <0.6 - Continue spironolactone  25mg  daily - INR 1.1. Continue heparin  drip.  Discussed with PharmD.  -LDH stable.    Severe MR s/p MVR - Echo 4/25 with severe MR  - TEER on 07/09/23 complicated by worsening MR - S/p MVR   Hx of PE/DVT w/ IVC filter - Evaluated at [facility] where thrombophilia work-up was negative (Lupus inhibitor, anticardiolipin antibodies, and anti beta-2  glycoprotein antibodies were negative; protein C activity 85%, protein S activity 76%, factor V Leiden, and prothrombin gene mutation ) - IVC filter in place; could not be removed due to significant thrombus burden on filter at [facility].  - Holding warfarin as above - Continue hep gtt.    Obesity  - BMI significantly improved, will need GLP-1 later if considering txp - Body mass index is 36.31 kg/m.   CAD - LHC in 5/23 w/ nonobstructive CAD involving RCA - Inferior WMA on most recent echo - no chest pain.    Tobacco use - understands need to quit   Right breast mass on CT - malignancy vs infection - will need mammogram - Has had R breast biopsy in 2022 which was benign    I reviewed the LVAD parameters from today, and compared the results to the patient's prior recorded data.  No programming changes were made.  The LVAD is functioning within specified parameters.   The patient performs LVAD self-test daily.  LVAD interrogation was negative for any significant power changes, alarms or PI events/speed drops.  LVAD equipment check completed and is in good working order.  Back-up equipment present.   LVAD education done on emergency procedures and precautions and reviewed exit site care.  Length of Stay: 8  Morene JINNY Brownie, MD 02/14/2024, 1:49 PM  VAD Team --- VAD ISSUES ONLY--- Pager [phone] (7am - 7am)  Advanced Heart Failure Team  Pager [phone] (M-F; 7a - 5p)   Please visit [email or web address]: For overnight coverage please call cardiology fellow first. If fellow not available call Shock/ECMO MD on call.  For ECMO / Mechanical Support (Impella, IABP, LVAD) issues call Shock / ECMO MD on call.    "

## 2024-02-14 NOTE — Plan of Care (Signed)
   Problem: Education: Goal: Patient will understand all VAD equipment and how it functions Outcome: Progressing   Problem: Cardiac: Goal: LVAD will function as expected and patient will experience no clinical alarms Outcome: Progressing   Problem: Education: Goal: Knowledge of General Education information will improve Description: Including pain rating scale, medication(s)/side effects and non-pharmacologic comfort measures Outcome: Progressing   Problem: Health Behavior/Discharge Planning: Goal: Ability to manage health-related needs will improve Outcome: Progressing   Problem: Clinical Measurements: Goal: Ability to maintain clinical measurements within normal limits will improve Outcome: Progressing

## 2024-02-15 ENCOUNTER — Other Ambulatory Visit: Payer: Self-pay | Admitting: Physician Assistant

## 2024-02-15 LAB — AEROBIC/ANAEROBIC CULTURE W GRAM STAIN (SURGICAL/DEEP WOUND)

## 2024-02-15 LAB — BASIC METABOLIC PANEL WITH GFR
Anion gap: 7 (ref 5–15)
BUN: 43 mg/dL — ABNORMAL HIGH (ref 6–20)
CO2: 33 mmol/L — ABNORMAL HIGH (ref 22–32)
Calcium: 9.4 mg/dL (ref 8.9–10.3)
Chloride: 100 mmol/L (ref 98–111)
Creatinine, Ser: 1.53 mg/dL — ABNORMAL HIGH (ref 0.44–1.00)
GFR, Estimated: 42 mL/min — ABNORMAL LOW
Glucose, Bld: 87 mg/dL (ref 70–99)
Potassium: 4.8 mmol/L (ref 3.5–5.1)
Sodium: 140 mmol/L (ref 135–145)

## 2024-02-15 LAB — CBC
HCT: 36.2 % (ref 36.0–46.0)
Hemoglobin: 11.7 g/dL — ABNORMAL LOW (ref 12.0–15.0)
MCH: 30.2 pg (ref 26.0–34.0)
MCHC: 32.3 g/dL (ref 30.0–36.0)
MCV: 93.5 fL (ref 80.0–100.0)
Platelets: 128 10*3/uL — ABNORMAL LOW (ref 150–400)
RBC: 3.87 MIL/uL (ref 3.87–5.11)
RDW: 15.6 % — ABNORMAL HIGH (ref 11.5–15.5)
WBC: 4.4 10*3/uL (ref 4.0–10.5)
nRBC: 0 % (ref 0.0–0.2)

## 2024-02-15 LAB — PROTIME-INR
INR: 1 (ref 0.8–1.2)
Prothrombin Time: 13.8 s (ref 11.4–15.2)

## 2024-02-15 LAB — LACTATE DEHYDROGENASE: LDH: 164 U/L (ref 105–235)

## 2024-02-15 LAB — HEPARIN LEVEL (UNFRACTIONATED): Heparin Unfractionated: <0.1 [IU]/mL — ABNORMAL LOW (ref 0.30–0.70)

## 2024-02-15 MED ORDER — WARFARIN SODIUM 7.5 MG PO TABS
7.5000 mg | ORAL_TABLET | Freq: Once | ORAL | Status: AC
  Administered 2024-02-15: 7.5 mg via ORAL
  Filled 2024-02-15: qty 1

## 2024-02-15 NOTE — Plan of Care (Signed)
" °  Problem: Education: Goal: Patient will understand all VAD equipment and how it functions Outcome: Progressing Goal: Patient will be able to verbalize current INR target range and antiplatelet therapy for discharge home Outcome: Progressing   Problem: Cardiac: Goal: LVAD will function as expected and patient will experience no clinical alarms Outcome: Progressing   Problem: Education: Goal: Knowledge of General Education information will improve Description: Including pain rating scale, medication(s)/side effects and non-pharmacologic comfort measures Outcome: Progressing   Problem: Clinical Measurements: Goal: Ability to maintain clinical measurements within normal limits will improve Outcome: Progressing   Problem: Activity: Goal: Risk for activity intolerance will decrease Outcome: Progressing   Problem: Nutrition: Goal: Adequate nutrition will be maintained Outcome: Progressing   Problem: Elimination: Goal: Will not experience complications related to bowel motility Outcome: Progressing Goal: Will not experience complications related to urinary retention Outcome: Progressing   Problem: Pain Managment: Goal: General experience of comfort will improve and/or be controlled Outcome: Progressing   "

## 2024-02-15 NOTE — Progress Notes (Signed)
 "  Advanced Heart Failure VAD Team Note  PCP-Cardiologist: None  Chief Complaint: LVAD infection  Patient Profile   [name] [name] is a 49 y.o. female with hx of saddle PE/DVT s/p IVC filter (2015), HFrEF/NICM, pancytopenia, nonobstructive CAD, morbid obesity, noncompliance, pancytopenia (followed by hematology), tobacco use. Admitted with LVAD DL infection  Significant events:    Admitted 1/16 for DL infection 8/79/73: S/p OR for DL debridement  8/77 : ID added linezolid . Continued on ancef .   Subjective:    Now on ceftriaxone  + Linezolid . Wound cx with rare proteus mirabilis+ few diphtheroids.   No fevers or chills  On heparin  at 500u/hr. INR 1.0. No bleeding   LVAD INTERROGATION:  HeartMate 3 VAD Equipment Check Pump Speed (RPM): 6000 RPM Pump Flow (LPM): 4.3 Power (Watts): 5 Watts Pulsatility Index: 3 Fixed Speed Limit: 6000 rpm Low Speed Limit: 5700 rpm Alarms: No alarms Auscultated: Normal expected humming Power Module Self-Test (Daily): Done System Controller Self-Test: Passed Patient Battery Source: [facility] / Wall unit Emergency Equipment at Bedside: Yes      Objective:   Vital Signs:   Temp:  [97.8 F (36.6 C)-98.2 F (36.8 C)] 98.1 F (36.7 C) (01/25 0822) Pulse Rate:  [59-76] 64 (01/25 0822) Resp:  [16-18] 17 (01/25 0822) BP: (85-103)/(54-84) 91/66 (01/25 0822) SpO2:  [93 %-99 %] 95 % (01/25 0822) Weight:  [114.5 kg] 114.5 kg (01/25 0650) Last BM Date : 02/14/24 Mean arterial Pressure 70-80s  Intake/Output:   Intake/Output Summary (Last 24 hours) at 02/15/2024 1000 Last data filed at 02/15/2024 1000 Gross per 24 hour  Intake 1811.67 ml  Output 3750 ml  Net -1938.33 ml    Physical Exam   General:  NAD.  HEENT: normal  Neck: supple. JVP not elevated.  Carotids 2+ bilat; no bruits. No lymphadenopathy or thryomegaly appreciated. Cor: LVAD hum.  Lungs: Clear. Abdomen: obese soft, mildly tender, non-distended. No hepatosplenomegaly. No bruits or  masses. Good bowel sounds. Driveline site dressing looks good.  Anchor in place.  Extremities: no cyanosis, clubbing, rash. Warm no edema  Neuro: alert & oriented x 3. No focal deficits. Moves all 4 without problem    Telemetry   Sinus 60s Personally reviewed   Labs  Basic Metabolic Panel: Recent Labs  Lab 02/11/24 0220 02/12/24 1119 02/13/24 0239 02/14/24 0230 02/15/24 0125  NA 135 137 137 139 140  K 4.7 4.6 4.5 4.7 4.8  CL 96* 100 99 100 100  CO2 32 27 31 32 33*  GLUCOSE 108* 100* 91 86 87  BUN 43* 37* 42* 42* 43*  CREATININE 1.39* 1.24* 1.40* 1.39* 1.53*  CALCIUM  9.5 9.2 9.1 9.2 9.4    Liver Function Tests: No results for input(s): AST, ALT, ALKPHOS, BILITOT, PROT, ALBUMIN  in the last 168 hours. No results for input(s): LIPASE, AMYLASE in the last 168 hours. No results for input(s): AMMONIA in the last 168 hours.  CBC: Recent Labs  Lab 02/09/24 0111 02/10/24 0217 02/11/24 0220 02/14/24 0230 02/15/24 0125  WBC 3.4* 3.5* 6.5 4.2 4.4  HGB 11.4* 11.5* 12.0 11.4* 11.7*  HCT 35.2* 35.4* 37.2 35.0* 36.2  MCV 91.9 91.2 92.3 92.8 93.5  PLT 119* 120* 145* 121* 128*    INR: Recent Labs  Lab 02/11/24 0220 02/12/24 0201 02/13/24 0239 02/14/24 0230 02/15/24 0125  INR 1.1 1.1 1.0 1.1 1.0    Medications:    Scheduled Medications:  atorvastatin   20 mg Oral q1800   digoxin   0.125 mg Oral Daily  gabapentin   300 mg Oral BID   linezolid   600 mg Oral Q12H   pantoprazole   40 mg Oral Daily   sacubitril -valsartan   1 tablet Oral BID   spironolactone   25 mg Oral Daily   torsemide   20 mg Oral Daily   warfarin  7.5 mg Oral ONCE-1600   Warfarin - Pharmacist Dosing Inpatient   Does not apply q1600    Infusions:  cefTRIAXone  (ROCEPHIN )  IV 2 g (02/14/24 1325)   heparin  500 Units/hr (02/15/24 0401)    PRN Medications: acetaminophen , albuterol , HYDROmorphone  (DILAUDID ) injection, lip balm, morphine  injection, ondansetron  (ZOFRAN ) IV,  oxyCODONE -acetaminophen , traMADol   Assessment/Plan:    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  Related to poor outpatient dressing care.   - CT abdomen pelvis with contrast - unremarkable except for R breast mass (see below) -Blood Cx- NGTD  - Continue ceftriaxone  and linezolid  - Wound Cx with few proteus mirabilis, few diphtheroids.  - S/p post I&D with Dr. Katelinn Justice 1/20.  - Continue dressing changes and current abx  Chronic systolic HF with biventricular dysfunction/stage D cardiomyopathy s/p HM3 07/24/2023 with concurrent MVR - Continue speed at 6000 RPM, stable - NYHA II.  - MAPs improved on Entresto . Volume ok - Continue digoxin  0.125 mg daily, last dig level <0.6 - Continue spironolactone  25mg  daily - INR 1.0  Continue heparin . Can likely increase dose a bit. Discussed warfarin dosing with PharmD personally. -LDH stable.    Severe MR s/p MVR - Echo 4/25 with severe MR  - TEER on 07/09/23 complicated by worsening MR - S/p MVR   Hx of PE/DVT w/ IVC filter - Evaluated at [facility] where thrombophilia work-up was negative (Lupus inhibitor, anticardiolipin antibodies, and anti beta-2  glycoprotein antibodies were negative; protein C activity 85%, protein S activity 76%, factor V Leiden, and prothrombin gene mutation ) - IVC filter in place; could not be removed due to significant thrombus burden on filter at [facility].  - On heparin /warfarin   Obesity  - BMI significantly improved, will need GLP-1 later if considering txp - Body mass index is 36.22 kg/m.   CAD - LHC in 5/23 w/ nonobstructive CAD involving RCA - Inferior WMA on most recent echo - no cs/s angina.    Tobacco use - understands need to quit   Right breast mass on CT - malignancy vs infection - will need mammogram - Has had R breast biopsy in 2022 which was benign    I reviewed the LVAD parameters from today, and compared the results to the patient's prior recorded data.  No  programming changes were made.  The LVAD is functioning within specified parameters.  The patient performs LVAD self-test daily.  LVAD interrogation was negative for any significant power changes, alarms or PI events/speed drops.  LVAD equipment check completed and is in good working order.  Back-up equipment present.   LVAD education done on emergency procedures and precautions and reviewed exit site care.  Length of Stay: 9  Toribio Fuel, MD 02/15/2024, 10:00 AM  VAD Team --- VAD ISSUES ONLY--- Pager [phone] (7am - 7am)  Advanced Heart Failure Team  Pager [phone] (M-F; 7a - 5p)   Please visit [email or web address]: For overnight coverage please call cardiology fellow first. If fellow not available call Shock/ECMO MD on call.  For ECMO / Mechanical Support (Impella, IABP, LVAD) issues call Shock / ECMO MD on call.    "

## 2024-02-15 NOTE — Progress Notes (Signed)
 PHARMACY - ANTICOAGULATION CONSULT NOTE  Pharmacy Consult for heparin  + resume warfarin Indication: LVAD HM3 with hx PE/DVT  Allergies[1]  Patient Measurements: Height: 5' 10 (177.8 cm) Weight: 114.5 kg (252 lb 6.8 oz) IBW/kg (Calculated) : 68.5 HEPARIN  DW (KG): 94  Vital Signs: Temp: 98.1 F (36.7 C) (01/25 0822) Temp Source: Oral (01/25 0822) BP: 91/66 (01/25 0822) Pulse Rate: 64 (01/25 0822)  Labs: Recent Labs    02/13/24 0239 02/14/24 0230 02/15/24 0125  HGB  --  11.4* 11.7*  HCT  --  35.0* 36.2  PLT  --  121* 128*  LABPROT 14.0 14.6 13.8  INR 1.0 1.1 1.0  HEPARINUNFRC <0.10* <0.10* <0.10*  CREATININE 1.40* 1.39* 1.53*    Estimated Creatinine Clearance: 62.4 mL/min (A) (by C-G formula based on SCr of 1.53 mg/dL (H)).   Medical History: Past Medical History:  Diagnosis Date   Anemia    Blood transfusion without reported diagnosis    CAD (coronary artery disease)    Nonobstructive   Closed left ankle fracture    August 30 2012   Clotting disorder    DVT (deep venous thrombosis) (HCC)    L leg   Fibroid tumor    HFrEF (heart failure with reduced ejection fraction) (HCC)    Hidradenitis    Hypertension    LVAD (left ventricular assist device) present [facility])    Mitral regurgitation    Myocardial infarction (HCC)    NICM (nonischemic cardiomyopathy) (HCC)    NSVT (nonsustained ventricular tachycardia) (HCC)    Obesity    OSA on CPAP    Pulmonary embolism (HCC) 04/2013   Sleep apnea     Medications:  Scheduled:   atorvastatin   20 mg Oral q1800   digoxin   0.125 mg Oral Daily   gabapentin   300 mg Oral BID   linezolid   600 mg Oral Q12H   pantoprazole   40 mg Oral Daily   sacubitril -valsartan   1 tablet Oral BID   spironolactone   25 mg Oral Daily   torsemide   20 mg Oral Daily   Warfarin - Pharmacist Dosing Inpatient   Does not apply q1600    Assessment: 49 yof presenting with concern for driveline infection - plan to admit for IV antibiotics and  evaluation for possible I&D. On warfarin PTA for LVAD + hx DVT/PE.   PTA warfarin regimen is 5 mg daily except 7.5 mg Tues/Sat at last clinic appointment.  Heparin  running at 500 units/hr - heparin  level this morning is <0.1 as expected with low dose heparin . INR today is 1.0 down from 1.1 yesterday. Hgb 11.7, plt 128 -  LDH 164 (stable). Previously with oozing during dressing changes, no bleeding today per RN. Charted eating 100% of meals.   Per discussion with AHF MD, plan to continue low dose heparin  infusion while INR remains <1.5.   Goal of Therapy:  INR 2-3 Heparin  level <0.3 - fixed rate unless directed by MD Monitor platelets by anticoagulation protocol: Yes   Plan:  Continue heparin  infusion at 500 units/hr fixed rate Warfarin tonight with 7.5 mg x 1 Monitor daily INR, HL, CBC, and for s/sx of bleeding  Thank you for allowing pharmacy to be a part of this patients care.   Nidia Schaffer, PharmD, BCPS PGY2 Cardiology Pharmacy Resident  Please check AMION for all [facility] Pharmacy phone numbers After 10:00 PM, call Main Pharmacy [phone]  02/15/2024 9:11 AM    [1]  Allergies Allergen Reactions   Sulfa  Antibiotics Itching, Swelling and Rash  Lip swelling

## 2024-02-16 ENCOUNTER — Telehealth (HOSPITAL_COMMUNITY): Payer: Self-pay

## 2024-02-16 ENCOUNTER — Other Ambulatory Visit (HOSPITAL_COMMUNITY): Payer: Self-pay

## 2024-02-16 LAB — CBC
HCT: 34.8 % — ABNORMAL LOW (ref 36.0–46.0)
Hemoglobin: 11.4 g/dL — ABNORMAL LOW (ref 12.0–15.0)
MCH: 29.9 pg (ref 26.0–34.0)
MCHC: 32.8 g/dL (ref 30.0–36.0)
MCV: 91.3 fL (ref 80.0–100.0)
Platelets: 154 10*3/uL (ref 150–400)
RBC: 3.81 MIL/uL — ABNORMAL LOW (ref 3.87–5.11)
RDW: 15.9 % — ABNORMAL HIGH (ref 11.5–15.5)
WBC: 4.2 10*3/uL (ref 4.0–10.5)
nRBC: 0 % (ref 0.0–0.2)

## 2024-02-16 LAB — BASIC METABOLIC PANEL WITH GFR
Anion gap: 11 (ref 5–15)
BUN: 38 mg/dL — ABNORMAL HIGH (ref 6–20)
CO2: 25 mmol/L (ref 22–32)
Calcium: 9.1 mg/dL (ref 8.9–10.3)
Chloride: 99 mmol/L (ref 98–111)
Creatinine, Ser: 1.45 mg/dL — ABNORMAL HIGH (ref 0.44–1.00)
GFR, Estimated: 45 mL/min — ABNORMAL LOW
Glucose, Bld: 147 mg/dL — ABNORMAL HIGH (ref 70–99)
Potassium: 5.2 mmol/L — ABNORMAL HIGH (ref 3.5–5.1)
Sodium: 136 mmol/L (ref 135–145)

## 2024-02-16 LAB — PROTIME-INR
INR: 1 (ref 0.8–1.2)
Prothrombin Time: 14.2 s (ref 11.4–15.2)

## 2024-02-16 LAB — MAGNESIUM: Magnesium: 2.2 mg/dL (ref 1.7–2.4)

## 2024-02-16 LAB — HEPARIN LEVEL (UNFRACTIONATED): Heparin Unfractionated: <0.1 [IU]/mL — ABNORMAL LOW (ref 0.30–0.70)

## 2024-02-16 LAB — LACTATE DEHYDROGENASE: LDH: 343 U/L — ABNORMAL HIGH (ref 105–235)

## 2024-02-16 MED ORDER — WARFARIN SODIUM 7.5 MG PO TABS
7.5000 mg | ORAL_TABLET | Freq: Once | ORAL | Status: AC
  Administered 2024-02-16: 7.5 mg via ORAL
  Filled 2024-02-16: qty 1

## 2024-02-16 NOTE — Progress Notes (Addendum)
 "  Advanced Heart Failure VAD Team Note  PCP-Cardiologist: None  Chief Complaint: LVAD infection  Patient Profile   [name] [name] is a 49 y.o. female with hx of saddle PE/DVT s/p IVC filter (2015), HFrEF/NICM, pancytopenia, nonobstructive CAD, morbid obesity, noncompliance, pancytopenia (followed by hematology), tobacco use. Admitted with LVAD DL infection  Significant events:    Admitted 1/16 for DL infection 8/79/73: S/p OR for DL debridement  8/77 : ID added linezolid . Continued on ancef .   Subjective:    Now on ceftriaxone  + Linezolid . Wound cx with rare proteus mirabilis+ few diphtheroids.   On heparin  at 500u/hr. INR 1.0. Getting coumadin .   LDH 164>343   LVAD INTERROGATION:  HeartMate 3 VAD Equipment Check Pump Speed (RPM): 6000 RPM Pump Flow (LPM): 4.5 Power (Watts): 5 Watts Pulsatility Index: 2.8 Fixed Speed Limit: 6000 rpm Low Speed Limit: 5700 rpm Alarms: No alarms Auscultated: Normal expected humming Power Module Self-Test (Daily): Done System Controller Self-Test: Passed Patient Battery Source: [facility] / Wall unit Emergency Equipment at Bedside: Yes  No PI events.      Objective:   Vital Signs:   Temp:  [97.9 F (36.6 C)-98.2 F (36.8 C)] 97.9 F (36.6 C) (01/26 0558) Pulse Rate:  [60-67] 63 (01/26 0825) Resp:  [17-20] 18 (01/26 0558) BP: (80-111)/(61-88) 86/72 (01/26 0558) SpO2:  [96 %-98 %] 97 % (01/25 2245) Weight:  [113.9 kg] 113.9 kg (01/26 0558) Last BM Date : 02/15/24 Mean arterial Pressure  70s   Intake/Output:   Intake/Output Summary (Last 24 hours) at 02/16/2024 0932 Last data filed at 02/16/2024 0602 Gross per 24 hour  Intake 1072.48 ml  Output 4200 ml  Net -3127.52 ml    Physical Exam  Physical Exam: GENERAL: No acute distress. NECK: Supple, JVP flat  CARDIAC:  Mechanical heart sounds with LVAD hum present.  LUNGS:  Clear to auscultation bilaterally.  ABDOMEN:  Soft, round.     LVAD exit site:  Dressing dry and intact.    EXTREMITIES:  Warm and dry, NEUROLOGIC:  Alert and oriented x 3.       EKG: SR    Labs  Basic Metabolic Panel: Recent Labs  Lab 02/12/24 1119 02/13/24 0239 02/14/24 0230 02/15/24 0125 02/16/24 0051  NA 137 137 139 140 136  K 4.6 4.5 4.7 4.8 5.2*  CL 100 99 100 100 99  CO2 27 31 32 33* 25  GLUCOSE 100* 91 86 87 147*  BUN 37* 42* 42* 43* 38*  CREATININE 1.24* 1.40* 1.39* 1.53* 1.45*  CALCIUM  9.2 9.1 9.2 9.4 9.1  MG  --   --   --   --  2.2    Liver Function Tests: No results for input(s): AST, ALT, ALKPHOS, BILITOT, PROT, ALBUMIN  in the last 168 hours. No results for input(s): LIPASE, AMYLASE in the last 168 hours. No results for input(s): AMMONIA in the last 168 hours.  CBC: Recent Labs  Lab 02/10/24 0217 02/11/24 0220 02/14/24 0230 02/15/24 0125 02/16/24 0051  WBC 3.5* 6.5 4.2 4.4 4.2  HGB 11.5* 12.0 11.4* 11.7* 11.4*  HCT 35.4* 37.2 35.0* 36.2 34.8*  MCV 91.2 92.3 92.8 93.5 91.3  PLT 120* 145* 121* 128* 154    INR: Recent Labs  Lab 02/12/24 0201 02/13/24 0239 02/14/24 0230 02/15/24 0125 02/16/24 0051  INR 1.1 1.0 1.1 1.0 1.0    Medications:    Scheduled Medications:  atorvastatin   20 mg Oral q1800   digoxin   0.125 mg Oral Daily  gabapentin   300 mg Oral BID   linezolid   600 mg Oral Q12H   pantoprazole   40 mg Oral Daily   sacubitril -valsartan   1 tablet Oral BID   spironolactone   25 mg Oral Daily   torsemide   20 mg Oral Daily   Warfarin - Pharmacist Dosing Inpatient   Does not apply q1600    Infusions:  cefTRIAXone  (ROCEPHIN )  IV 200 mL/hr at 02/15/24 1436   heparin  500 Units/hr (02/15/24 1436)    PRN Medications: acetaminophen , albuterol , HYDROmorphone  (DILAUDID ) injection, lip balm, morphine  injection, ondansetron  (ZOFRAN ) IV, oxyCODONE -acetaminophen , traMADol   Assessment/Plan:    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  Related to poor outpatient dressing care.   -  CT abdomen pelvis with contrast - unremarkable except for R breast mass (see below) -Blood Cx- NGTD  - Continue ceftriaxone  and linezolid  - Wound Cx with few proteus mirabilis, few diphtheroids.  - S/P post I&D with Dr. Daniel 1/20.  - Continue dressing changes and current abx  Chronic systolic HF with biventricular dysfunction/stage D cardiomyopathy s/p HM3 07/24/2023 with concurrent MVR - Continue speed at 6000 RPM, stable - NYHA II. Appears euvolemic.  - MAPs stable. Continue Entresto . Volume ok - Continue digoxin  0.125 mg daily, last dig level <0.6 - Continue spironolactone  25mg  daily - INR 1.0.  -LDH 164>343. Watch closely. Discussed with pharm D. Increase heparin  700 units.    Severe MR s/p MVR - Echo 4/25 with severe MR  - TEER on 07/09/23 complicated by worsening MR - S/p MVR   Hx of PE/DVT w/ IVC filter - Evaluated at [facility] where thrombophilia work-up was negative (Lupus inhibitor, anticardiolipin antibodies, and anti beta-2  glycoprotein antibodies were negative; protein C activity 85%, protein S activity 76%, factor V Leiden, and prothrombin gene mutation ) - IVC filter in place; could not be removed due to significant thrombus burden on filter at [facility].  - On heparin /warfarin. INR 1   Obesity  - BMI significantly improved, will need GLP-1 later if considering txp - Body mass index is 36.03 kg/m.   CAD - LHC in 5/23 w/ nonobstructive CAD involving RCA - Inferior WMA on most recent echo - no cs/s angina.    Tobacco use - understands need to quit   Right breast mass on CT - malignancy vs infection - will need mammogram - Has had R breast biopsy in 2022 which was benign - CT this admit 3.6 x 3.4 cm possible malignancy or infection/abscess.     I reviewed the LVAD parameters from today, and compared the results to the patient's prior recorded data.  No programming changes were made.  The LVAD is functioning within specified parameters.  The patient performs LVAD self-test  daily.  LVAD interrogation was negative for any significant power changes, alarms or PI events/speed drops.  LVAD equipment check completed and is in good working order.  Back-up equipment present.   LVAD education done on emergency procedures and precautions and reviewed exit site care.  Length of Stay: 10  [name], NP 02/16/2024, 9:32 AM  VAD Team --- VAD ISSUES ONLY--- Pager [phone] (7am - 7am)  Advanced Heart Failure Team  Pager [phone] (M-F; 7a - 5p)   Please visit [email or web address]: For overnight coverage please call cardiology fellow first. If fellow not available call Shock/ECMO MD on call.  For ECMO / Mechanical Support (Impella, IABP, LVAD) issues call Shock / ECMO MD on call.    Patient seen with NP, agree with the above  note.   She remains on ceftriaxone /linezolid  for driveline infection, now s/p debridement.    Of note, LDH up to 343 from 164.   INR 1.0, on heparin  gtt. MAP 70s today.   General: Well appearing this am. NAD.  HEENT: Normal. Neck: Supple, JVP 7-8 cm. Carotids OK.  Cardiac:  Mechanical heart sounds with LVAD hum present.  Lungs:  CTAB, normal effort.  Abdomen:  NT, ND, no HSM. No bruits or masses. +BS  LVAD exit site: Driveline dressed.  Extremities:  Warm and dry. No cyanosis, clubbing, rash, or edema.  Neuro:  Alert & oriented x 3. Cranial nerves grossly intact. Moves all 4 extremities w/o difficulty. Affect pleasant    Continue ceftriaxone  to 3/3/linezolid  to 2/5 for driveline infection.  PICC for home for ceftriaxone .   LVAD parameters reviewed and stable.   On warfarin + heparin  gtt with INR 1.0, LDH high so will increase heparin  gtt today.    Has right breast mass, this seems to be chronic with negative biopsy in 2022.  Will need workup with mammogram as outpatient.   Ezra Shuck 02/16/2024 10:12 AM  "

## 2024-02-16 NOTE — Telephone Encounter (Signed)
 Pharmacy Patient Advocate Encounter  Insurance verification completed.    The patient is insured through [facility]. Patient has Medicare and is not eligible for a copay card, but may be able to apply for patient assistance or Medicare RX Payment Plan (Patient Must reach out to their plan, if eligible for payment plan), if available.    Ran test claim for Entresto  24-26mg  tablet and the current 30 day co-pay is $1.60.   This test claim was processed through [facility]- copay amounts may vary at other pharmacies due to pharmacy/plan contracts, or as the patient moves through the different stages of their insurance plan.

## 2024-02-16 NOTE — Plan of Care (Signed)
  Problem: Education: Goal: Patient will understand all VAD equipment and how it functions Outcome: Progressing Goal: Patient will be able to verbalize current INR target range and antiplatelet therapy for discharge home Outcome: Progressing   Problem: Cardiac: Goal: LVAD will function as expected and patient will experience no clinical alarms Outcome: Progressing   Problem: Education: Goal: Knowledge of General Education information will improve Description: Including pain rating scale, medication(s)/side effects and non-pharmacologic comfort measures Outcome: Progressing   Problem: Health Behavior/Discharge Planning: Goal: Ability to manage health-related needs will improve Outcome: Progressing   Problem: Clinical Measurements: Goal: Ability to maintain clinical measurements within normal limits will improve Outcome: Progressing Goal: Will remain free from infection Outcome: Progressing Goal: Diagnostic test results will improve Outcome: Progressing Goal: Respiratory complications will improve Outcome: Progressing Goal: Cardiovascular complication will be avoided Outcome: Progressing   Problem: Activity: Goal: Risk for activity intolerance will decrease Outcome: Progressing   Problem: Nutrition: Goal: Adequate nutrition will be maintained Outcome: Progressing   Problem: Coping: Goal: Level of anxiety will decrease Outcome: Progressing   Problem: Elimination: Goal: Will not experience complications related to bowel motility Outcome: Progressing Goal: Will not experience complications related to urinary retention Outcome: Progressing   Problem: Pain Managment: Goal: General experience of comfort will improve and/or be controlled Outcome: Progressing   Problem: Safety: Goal: Ability to remain free from injury will improve Outcome: Progressing

## 2024-02-16 NOTE — Progress Notes (Signed)
 PHARMACY - ANTICOAGULATION CONSULT NOTE  Pharmacy Consult for heparin  + resume warfarin Indication: LVAD HM3 with hx PE/DVT  Allergies[1]  Patient Measurements: Height: 5' 10 (177.8 cm) Weight: 113.9 kg (251 lb 1.6 oz) IBW/kg (Calculated) : 68.5 HEPARIN  DW (KG): 94  Vital Signs: Temp: 97.8 F (36.6 C) (01/26 0827) Temp Source: Oral (01/26 0827) BP: 86/72 (01/26 0558) Pulse Rate: 63 (01/26 0827)  Labs: Recent Labs    02/14/24 0230 02/15/24 0125 02/16/24 0051  HGB 11.4* 11.7* 11.4*  HCT 35.0* 36.2 34.8*  PLT 121* 128* 154  LABPROT 14.6 13.8 14.2  INR 1.1 1.0 1.0  HEPARINUNFRC <0.10* <0.10* <0.10*  CREATININE 1.39* 1.53* 1.45*    Estimated Creatinine Clearance: 65.6 mL/min (A) (by C-G formula based on SCr of 1.45 mg/dL (H)).   Medical History: Past Medical History:  Diagnosis Date   Anemia    Blood transfusion without reported diagnosis    CAD (coronary artery disease)    Nonobstructive   Closed left ankle fracture    August 30 2012   Clotting disorder    DVT (deep venous thrombosis) (HCC)    L leg   Fibroid tumor    HFrEF (heart failure with reduced ejection fraction) (HCC)    Hidradenitis    Hypertension    LVAD (left ventricular assist device) present [facility])    Mitral regurgitation    Myocardial infarction (HCC)    NICM (nonischemic cardiomyopathy) (HCC)    NSVT (nonsustained ventricular tachycardia) (HCC)    Obesity    OSA on CPAP    Pulmonary embolism (HCC) 04/2013   Sleep apnea     Medications:  Scheduled:   atorvastatin   20 mg Oral q1800   digoxin   0.125 mg Oral Daily   gabapentin   300 mg Oral BID   linezolid   600 mg Oral Q12H   pantoprazole   40 mg Oral Daily   sacubitril -valsartan   1 tablet Oral BID   spironolactone   25 mg Oral Daily   torsemide   20 mg Oral Daily   warfarin  7.5 mg Oral ONCE-1600   Warfarin - Pharmacist Dosing Inpatient   Does not apply q1600    Assessment: 73 yof presenting with concern for driveline infection -  plan to admit for IV antibiotics and evaluation for possible I&D. On warfarin PTA for LVAD + hx DVT/PE.   PTA warfarin regimen is 5 mg daily except 7.5 mg Tues/Sat at last clinic appointment.  Heparin  running at 500 units/hr - heparin  level this morning is <0.1 as expected with low dose heparin . LDH bump 160>300 in last 24h - will increase heparin  drip rate slightly and recheck in am  INR still 1 despite retarting PTA warfarin - will give boost  Hgb 11 stable, plt 120s stable. Previously with oozing during dressing changes, no bleeding today per RN. Charted eating 100% of meals.   Per discussion with AHF MD, plan to continue low dose heparin  infusion while INR remains <1.5.   Goal of Therapy:  INR 2-3 Heparin  level <0.3 - fixed rate unless directed by MD Monitor platelets by anticoagulation protocol: Yes   Plan:  Increase heparin  infusion 700 units/hr fixed rate Warfarin tonight with 7.5 mg x 1 Monitor daily INR, HL, CBC, and for s/sx of bleeding   Olam Chalk Pharm.D. CPP, BCPS Clinical Pharmacist [phone] 02/16/2024 10:46 AM    Please check AMION for all [facility] Pharmacy phone numbers After 10:00 PM, call Main Pharmacy [phone]  02/16/2024 10:43 AM     [1]  Allergies Allergen Reactions   Sulfa  Antibiotics Itching, Swelling and Rash    Lip swelling

## 2024-02-16 NOTE — Plan of Care (Signed)
" °  Problem: Education: Goal: Patient will understand all VAD equipment and how it functions Outcome: Progressing Goal: Patient will be able to verbalize current INR target range and antiplatelet therapy for discharge home Outcome: Progressing   Problem: Cardiac: Goal: LVAD will function as expected and patient will experience no clinical alarms Outcome: Progressing   Problem: Education: Goal: Knowledge of General Education information will improve Description: Including pain rating scale, medication(s)/side effects and non-pharmacologic comfort measures Outcome: Progressing   Problem: Health Behavior/Discharge Planning: Goal: Ability to manage health-related needs will improve Outcome: Progressing   Problem: Clinical Measurements: Goal: Ability to maintain clinical measurements within normal limits will improve Outcome: Progressing Goal: Respiratory complications will improve Outcome: Progressing Goal: Cardiovascular complication will be avoided Outcome: Progressing   Problem: Nutrition: Goal: Adequate nutrition will be maintained Outcome: Progressing   Problem: Coping: Goal: Level of anxiety will decrease Outcome: Progressing   Problem: Elimination: Goal: Will not experience complications related to bowel motility Outcome: Progressing Goal: Will not experience complications related to urinary retention Outcome: Progressing   Problem: Pain Managment: Goal: General experience of comfort will improve and/or be controlled Outcome: Progressing   Problem: Safety: Goal: Ability to remain free from injury will improve Outcome: Progressing   Problem: Skin Integrity: Goal: Risk for impaired skin integrity will decrease Outcome: Progressing   Problem: Education: Goal: Knowledge of the prescribed therapeutic regimen will improve Outcome: Progressing   "

## 2024-02-16 NOTE — TOC Progression Note (Signed)
 Transition of Care [facility]) - Progression Note    Patient Details  Name: [name] [name] MRN: [id number] Date of Birth: 1975-03-02  [facility] CM/SW Contact  [name] Phone Number: [phone] 02/16/2024, 8:44 AM  Clinical Narrative:   Per chart review, patient is not medically stable for dc. ICM will continue to follow.   HF CSW/CM will continue to follow and monitor for dc readiness.     Expected Discharge Plan: Home/Self Care Barriers to Discharge: Continued Medical Work up               Expected Discharge Plan and Services       Living arrangements for the past 2 months: Single Family Home                                       Social Drivers of Health (SDOH) Interventions SDOH Screenings   Food Insecurity: No Food Insecurity (02/07/2024)  Housing: Low Risk (02/07/2024)  Transportation Needs: Unmet Transportation Needs (02/07/2024)  Utilities: Not At Risk (02/07/2024)  Alcohol  Screen: Low Risk (12/15/2023)  Depression (PHQ2-9): Low Risk (12/16/2023)  Financial Resource Strain: Low Risk (12/15/2023)  Physical Activity: Insufficiently Active (12/15/2023)  Social Connections: Moderately Isolated (12/15/2023)  Stress: No Stress Concern Present (12/15/2023)  Tobacco Use: High Risk (02/10/2024)    Readmission Risk Interventions    07/03/2022   12:12 PM 02/13/2022   12:41 PM 12/31/2021    1:52 PM  Readmission Risk Prevention Plan  Transportation Screening Complete Complete   HRI or Home Care Consult Complete Complete Complete  Social Work Consult for Recovery Care Planning/Counseling Complete Complete Complete  Palliative Care Screening Not Applicable Not Applicable Not Applicable  Medication Review Oceanographer) Complete Complete Complete

## 2024-02-16 NOTE — Progress Notes (Signed)
 LVAD Coordinator Rounding Note:  Admitted 02/06/24 from VAD clinic for drive line infection to advanced heart failure service  HM III LVAD implanted on 07/24/23 by Dr. Lucas under destination criteria.  Pt laying in bed on my arrival. Denies complaints. Dressing changed today at bedside. See below for documentation.  PRN pain medication administered prior to wound packing.   ID following. Plan for IV Ceftriaxone  once daily for 6 weeks (End date: 03/23/24), followed by suppression. Will do 2 weeks PO Linezolid  (End date: 02/26/24) to cover the Corynebacterium from the culture obtained off the velour. Will need PICC placed closer to discharge.  Discussed importance of compliance with wound care with pt at bedside today. Her friend Skippy is willing to be trained on dressing change. Will have to see when she will be able to come for training as she currently is unable to drive due to the icy road conditions.  Vital signs: Temp: 97.8 HR: 63 Doppler Pressure: 86 Automatic BP: 102/89 (95) O2 Sat: 97% on RA Wt: 251.7>250.6>247.8>252.4>254.8>251.1  lbs  LVAD interrogation reveals:  Speed: 6000 Flow: 4.7 Power: 4.7 w PI: 2.0  Alarms: none Events: none Hematocrit: 35  Fixed speed: 6000 Low speed limit: 5700  Drive Line: Pt premedicated by bedside RN prior to wound care. Existing VAD dressing/packing removed and site care performed using sterile technique. Drive line exit site/incision cleaned with CHG swab x 2, allowed to dry. Packed incision with 1/2 of a large VASHE moistened kerlex. Covered Silverlon patch, with several dry 4 x 4. Red rubber with suture intact directly at exit site. Incisional sutures intact- skin well approximated. Exit site partially incorporated. Velour removed in OR, driveline significantly exposed at exit site. Moderate amount of serosanguinous drainage on previous dressing. No oozing noted with packing removal. Tenderness present with packing. No foul odor or rash noted.  Anchor reapplied. Daily dressing changes by bedside nurse or VAD coordinator. Next dressing change due 02/17/23.       Labs:  LDH trend: 179>196>181>164>343  INR trend: 1.2>1.1>1.1>1.0>1.0  Anticoagulation Plan: - INR Goal: 2.0-3.0 - ASA Dose: n/a - Coumadin  dosing per pharmacy  Arrythmias:  HX: NSVT on p.o. amiodarone   Infection: 01/04/25>> wound cx >> few eikenella corrodens 02/06/24>> blood cultures >> no growth 3 days 02/10/24>>OR wound cx>>rare proteus mirabilis 02/10/24>>OR fungal cx>>pending 02/10/24>> OR wound cx #2>> rare proteus mirabilis 02/10/24>> OR fungal cx #2>>pending 02/10/24>> OR drive line velour>> few proteus mirabilis, few diptheroids (corynebacterium) 02/10/24>> OR drive line velour fungal cx>> pending  Drips:  Heparin  700 units/hr  Adverse Events on VAD:  Plan/Recommendations:  1. Page VAD coordinator with any VAD alarms or equipment issues. 2. Daily drive line dressing changes per bedside RN or VAD coordinator  [name] RN VAD Coordinator  Office: [phone]  24/7 Pager: [phone]

## 2024-02-17 LAB — BASIC METABOLIC PANEL WITH GFR
Anion gap: 10 (ref 5–15)
BUN: 30 mg/dL — ABNORMAL HIGH (ref 6–20)
CO2: 28 mmol/L (ref 22–32)
Calcium: 9.3 mg/dL (ref 8.9–10.3)
Chloride: 100 mmol/L (ref 98–111)
Creatinine, Ser: 1.17 mg/dL — ABNORMAL HIGH (ref 0.44–1.00)
GFR, Estimated: 58 mL/min — ABNORMAL LOW
Glucose, Bld: 88 mg/dL (ref 70–99)
Potassium: 4.4 mmol/L (ref 3.5–5.1)
Sodium: 138 mmol/L (ref 135–145)

## 2024-02-17 LAB — CBC
HCT: 37.9 % (ref 36.0–46.0)
Hemoglobin: 12.3 g/dL (ref 12.0–15.0)
MCH: 30.3 pg (ref 26.0–34.0)
MCHC: 32.5 g/dL (ref 30.0–36.0)
MCV: 93.3 fL (ref 80.0–100.0)
Platelets: 145 10*3/uL — ABNORMAL LOW (ref 150–400)
RBC: 4.06 MIL/uL (ref 3.87–5.11)
RDW: 15.5 % (ref 11.5–15.5)
WBC: 3.7 10*3/uL — ABNORMAL LOW (ref 4.0–10.5)
nRBC: 0 % (ref 0.0–0.2)

## 2024-02-17 LAB — PROTIME-INR
INR: 1.1 (ref 0.8–1.2)
Prothrombin Time: 15.1 s (ref 11.4–15.2)

## 2024-02-17 LAB — HEPARIN LEVEL (UNFRACTIONATED): Heparin Unfractionated: <0.1 [IU]/mL — ABNORMAL LOW (ref 0.30–0.70)

## 2024-02-17 LAB — LACTATE DEHYDROGENASE: LDH: 160 U/L (ref 105–235)

## 2024-02-17 MED ORDER — WARFARIN SODIUM 5 MG PO TABS
10.0000 mg | ORAL_TABLET | Freq: Once | ORAL | Status: AC
  Administered 2024-02-17: 10 mg via ORAL
  Filled 2024-02-17: qty 2

## 2024-02-17 NOTE — Progress Notes (Signed)
 LVAD Coordinator Rounding Note:  Admitted 02/06/24 from VAD clinic for drive line infection to advanced heart failure service  HM III LVAD implanted on 07/24/23 by Dr. Lucas under destination criteria.  Pt laying in bed on my arrival. Denies complaints. Dressing changed today at bedside. See below for documentation.  PRN pain medication administered prior to wound packing.   ID following. Plan for IV Ceftriaxone  once daily for 6 weeks (End date: 03/23/24), followed by suppression. Will do 2 weeks PO Linezolid  (End date: 02/26/24) to cover the Corynebacterium from the culture obtained off the velour. Will need PICC placed closer to discharge.  Discussed importance of compliance with wound care with pt at bedside today. Her friend Skippy is willing to be trained on dressing change. Will have to see when she will be able to come for training as she currently is unable to drive due to the icy road conditions.  Vital signs: Temp: 98 HR: 66 Doppler Pressure: 78 Automatic BP: 86/72 (79) O2 Sat: 97% on RA Wt: 251.7>250.6>247.8>252.4>254.8>251.1  lbs  LVAD interrogation reveals:  Speed: 6000 Flow: 4.6 Power: 4.9 w PI: 2.3  Alarms: none Events: 1 today Hematocrit: 35  Fixed speed: 6000 Low speed limit: 5700  Drive Line: Pt premedicated by bedside RN prior to wound care. Existing VAD dressing/packing removed and site care performed using sterile technique. Drive line exit site/incision cleaned with CHG swab x 2, allowed to dry. Packed incision with 1/2 of a large VASHE moistened kerlex. Covered Silverlon patch, with several dry 4 x 4. Red rubber with suture intact directly at exit site. Incisional sutures intact- skin well approximated. Exit site partially incorporated. Velour removed in OR, driveline significantly exposed at exit site. Moderate amount of serosanguinous drainage on previous dressing. No oozing noted with packing removal. Tenderness present with packing. No foul odor or rash noted.  Anchor reapplied. Daily dressing changes by bedside nurse or VAD coordinator. Next dressing change due 02/18/23.      Labs:  LDH trend: 179>196>181>164>343>160  INR trend: 1.2>1.1>1.1>1.0>1.0>1.1  Anticoagulation Plan: - INR Goal: 2.0-3.0 - ASA Dose: n/a - Coumadin  dosing per pharmacy  Arrythmias:  HX: NSVT on p.o. amiodarone   Infection: 01/04/25>> wound cx >> few eikenella corrodens 02/06/24>> blood cultures >> no growth 3 days 02/10/24>>OR wound cx>>rare proteus mirabilis 02/10/24>>OR fungal cx>>pending 02/10/24>> OR wound cx #2>> rare proteus mirabilis 02/10/24>> OR fungal cx #2>>pending 02/10/24>> OR drive line velour>> few proteus mirabilis, few diptheroids (corynebacterium) 02/10/24>> OR drive line velour fungal cx>> pending  Drips:  Heparin  700 units/hr  Adverse Events on VAD:  Plan/Recommendations:  1. Page VAD coordinator with any VAD alarms or equipment issues. 2. Daily drive line dressing changes per bedside RN or VAD coordinator  [name] Lunger RN, BSN VAD Coordinator 24/7 Pager [phone]

## 2024-02-17 NOTE — Plan of Care (Signed)
" °  Problem: Education: Goal: Patient will understand all VAD equipment and how it functions Outcome: Progressing Goal: Patient will be able to verbalize current INR target range and antiplatelet therapy for discharge home Outcome: Progressing   Problem: Cardiac: Goal: LVAD will function as expected and patient will experience no clinical alarms Outcome: Progressing   Problem: Education: Goal: Knowledge of General Education information will improve Description: Including pain rating scale, medication(s)/side effects and non-pharmacologic comfort measures Outcome: Progressing   Problem: Health Behavior/Discharge Planning: Goal: Ability to manage health-related needs will improve Outcome: Progressing   Problem: Clinical Measurements: Goal: Ability to maintain clinical measurements within normal limits will improve Outcome: Progressing Goal: Will remain free from infection Outcome: Progressing Goal: Diagnostic test results will improve Outcome: Progressing Goal: Respiratory complications will improve Outcome: Progressing Goal: Cardiovascular complication will be avoided Outcome: Progressing   Problem: Activity: Goal: Risk for activity intolerance will decrease Outcome: Progressing   Problem: Nutrition: Goal: Adequate nutrition will be maintained Outcome: Progressing   Problem: Coping: Goal: Level of anxiety will decrease Outcome: Progressing   Problem: Elimination: Goal: Will not experience complications related to bowel motility Outcome: Progressing Goal: Will not experience complications related to urinary retention Outcome: Progressing   Problem: Pain Managment: Goal: General experience of comfort will improve and/or be controlled Outcome: Progressing   Problem: Safety: Goal: Ability to remain free from injury will improve Outcome: Progressing   Problem: Education: Goal: Knowledge of the prescribed therapeutic regimen will improve Outcome: Progressing    Problem: Bowel/Gastric: Goal: Gastrointestinal status for postoperative course will improve Outcome: Progressing   Problem: Nutritional: Goal: Will attain and maintain optimal nutritional status Outcome: Progressing   Problem: Neurological: Goal: Will regain or maintain usual level of consciousness Outcome: Progressing   Problem: Clinical Measurements: Goal: Ability to maintain clinical measurements within normal limits Outcome: Progressing Goal: Postoperative complications will be avoided or minimized Outcome: Progressing   Problem: Respiratory: Goal: Will regain and/or maintain adequate ventilation Outcome: Progressing   "

## 2024-02-17 NOTE — Progress Notes (Signed)
 PHARMACY - ANTICOAGULATION CONSULT NOTE  Pharmacy Consult for heparin  + resume warfarin Indication: LVAD HM3 with hx PE/DVT  Allergies[1]  Patient Measurements: Height: 5' 10 (177.8 cm) Weight: 113.9 kg (251 lb 1.6 oz) IBW/kg (Calculated) : 68.5 HEPARIN  DW (KG): 94  Vital Signs: Temp: 98 F (36.7 C) (01/27 0747) Temp Source: Oral (01/27 0747) BP: 86/72 (01/27 0747) Pulse Rate: 62 (01/27 0747)  Labs: Recent Labs    02/15/24 0125 02/16/24 0051 02/17/24 0301  HGB 11.7* 11.4* 12.3  HCT 36.2 34.8* 37.9  PLT 128* 154 145*  LABPROT 13.8 14.2 15.1  INR 1.0 1.0 1.1  HEPARINUNFRC <0.10* <0.10* <0.10*  CREATININE 1.53* 1.45* 1.17*    Estimated Creatinine Clearance: 81.4 mL/min (A) (by C-G formula based on SCr of 1.17 mg/dL (H)).   Medical History: Past Medical History:  Diagnosis Date   Anemia    Blood transfusion without reported diagnosis    CAD (coronary artery disease)    Nonobstructive   Closed left ankle fracture    August 30 2012   Clotting disorder    DVT (deep venous thrombosis) (HCC)    L leg   Fibroid tumor    HFrEF (heart failure with reduced ejection fraction) (HCC)    Hidradenitis    Hypertension    LVAD (left ventricular assist device) present [facility])    Mitral regurgitation    Myocardial infarction (HCC)    NICM (nonischemic cardiomyopathy) (HCC)    NSVT (nonsustained ventricular tachycardia) (HCC)    Obesity    OSA on CPAP    Pulmonary embolism (HCC) 04/2013   Sleep apnea     Medications:  Scheduled:   atorvastatin   20 mg Oral q1800   digoxin   0.125 mg Oral Daily   gabapentin   300 mg Oral BID   linezolid   600 mg Oral Q12H   pantoprazole   40 mg Oral Daily   sacubitril -valsartan   1 tablet Oral BID   spironolactone   25 mg Oral Daily   torsemide   20 mg Oral Daily   warfarin  10 mg Oral ONCE-1600   Warfarin - Pharmacist Dosing Inpatient   Does not apply q1600    Assessment: 57 yof presenting with concern for driveline infection - plan to  admit for IV antibiotics and evaluation for possible I&D. On warfarin PTA for LVAD + hx DVT/PE.   PTA warfarin regimen is 5 mg daily except 7.5 mg Tues/Sat at last clinic appointment.  Heparin  running at 700 units/hr - increased 1/26 with bump in LDH 160> 300 now back to 160 today  - assume 300 was erroneous. Heparin  level this morning is <0.1 as expected with low dose heparin .   INR still 1.1 despite retarting PTA warfarin - will give boost today Hgb 11-12 stable, plt 120-150s stable. Previously with oozing during dressing changes, no bleeding today per RN. Charted eating 100% of meals.   Per discussion with AHF MD, plan to continue low dose heparin  infusion while INR remains <1.5.   Goal of Therapy:  INR 2-3 Heparin  level <0.3 - fixed rate unless directed by MD Monitor platelets by anticoagulation protocol: Yes   Plan:  Continue heparin  infusion 700 units/hr fixed rate Warfarin 10 mg x 1 Monitor daily INR, HL, CBC, and for s/sx of bleeding   Olam Chalk Pharm.D. CPP, BCPS Clinical Pharmacist [phone] 02/17/2024 8:48 AM    Please check AMION for all [facility] Pharmacy phone numbers After 10:00 PM, call Main Pharmacy [phone]  02/17/2024 8:48 AM      [  1]  Allergies Allergen Reactions   Sulfa  Antibiotics Itching, Swelling and Rash    Lip swelling

## 2024-02-17 NOTE — Plan of Care (Signed)
" °  Problem: Cardiac: Goal: LVAD will function as expected and patient will experience no clinical alarms Outcome: Progressing   Problem: Education: Goal: Knowledge of General Education information will improve Description: Including pain rating scale, medication(s)/side effects and non-pharmacologic comfort measures Outcome: Progressing   Problem: Health Behavior/Discharge Planning: Goal: Ability to manage health-related needs will improve Outcome: Progressing   Problem: Clinical Measurements: Goal: Ability to maintain clinical measurements within normal limits will improve Outcome: Progressing Goal: Will remain free from infection Outcome: Progressing   Problem: Activity: Goal: Risk for activity intolerance will decrease Outcome: Progressing   Problem: Nutrition: Goal: Adequate nutrition will be maintained Outcome: Progressing   "

## 2024-02-17 NOTE — Progress Notes (Addendum)
 "  Advanced Heart Failure VAD Team Note  PCP-Cardiologist: None  Chief Complaint: LVAD infection  Patient Profile   [name] [name] is a 49 y.o. female with hx of saddle PE/DVT s/p IVC filter (2015), HFrEF/NICM, pancytopenia, nonobstructive CAD, morbid obesity, noncompliance, pancytopenia (followed by hematology), tobacco use. Admitted with LVAD DL infection  Significant events:    Admitted 1/16 for DL infection 8/79/73: S/p OR for DL debridement  8/77 : ID added linezolid . Continued on ancef .   Subjective:    On ceftriaxone  + Linezolid . Wound cx with rare proteus mirabilis+ few diphtheroids.   On heparin  at 700u/hr. INR 1.1. Getting coumadin .   LDH 164>343>160  Feels good this morning. Still with some pain around DL site.   LVAD INTERROGATION:  HeartMate 3 VAD Equipment Check Pump Speed (RPM): 6000 RPM Pump Flow (LPM): 4.6 Power (Watts): 5 Watts Pulsatility Index: 2.3 Fixed Speed Limit: 6000 rpm Low Speed Limit: 5700 rpm Alarms: No alarms Auscultated: Normal expected humming Power Module Self-Test (Daily): Done System Controller Self-Test: Passed Patient Battery Source: [facility] / Wall unit Emergency Equipment at Bedside: Yes  x5 PI events.   Objective:   Vital Signs:   Temp:  [97.7 F (36.5 C)-98.7 F (37.1 C)] 98 F (36.7 C) (01/27 0747) Pulse Rate:  [62-76] 62 (01/27 0747) Resp:  [16-19] 16 (01/27 0747) BP: (75-91)/(48-72) 86/72 (01/27 0747) SpO2:  [98 %-99 %] 98 % (01/27 0307) Last BM Date : 02/16/24 Mean arterial Pressure  80s-90s   Intake/Output:   Intake/Output Summary (Last 24 hours) at 02/17/2024 0830 Last data filed at 02/16/2024 1950 Gross per 24 hour  Intake --  Output 2300 ml  Net -2300 ml    Physical Exam  General:  Well appearing. No resp difficulty Neck:  JVP flat.  Cor: Mechanical heart sounds with LVAD hum present. Lungs: Clear Driveline: C/D/I; securement device intact and driveline incorporated Extremities: no edema Neuro: alert &  oriented x3. Affect pleasant   Tele: NSR 60s (Personally reviewed)    Labs  Basic Metabolic Panel: Recent Labs  Lab 02/13/24 0239 02/14/24 0230 02/15/24 0125 02/16/24 0051 02/17/24 0301  NA 137 139 140 136 138  K 4.5 4.7 4.8 5.2* 4.4  CL 99 100 100 99 100  CO2 31 32 33* 25 28  GLUCOSE 91 86 87 147* 88  BUN 42* 42* 43* 38* 30*  CREATININE 1.40* 1.39* 1.53* 1.45* 1.17*  CALCIUM  9.1 9.2 9.4 9.1 9.3  MG  --   --   --  2.2  --     Liver Function Tests: No results for input(s): AST, ALT, ALKPHOS, BILITOT, PROT, ALBUMIN  in the last 168 hours. No results for input(s): LIPASE, AMYLASE in the last 168 hours. No results for input(s): AMMONIA in the last 168 hours.  CBC: Recent Labs  Lab 02/11/24 0220 02/14/24 0230 02/15/24 0125 02/16/24 0051 02/17/24 0301  WBC 6.5 4.2 4.4 4.2 3.7*  HGB 12.0 11.4* 11.7* 11.4* 12.3  HCT 37.2 35.0* 36.2 34.8* 37.9  MCV 92.3 92.8 93.5 91.3 93.3  PLT 145* 121* 128* 154 145*    INR: Recent Labs  Lab 02/13/24 0239 02/14/24 0230 02/15/24 0125 02/16/24 0051 02/17/24 0301  INR 1.0 1.1 1.0 1.0 1.1    Medications:    Scheduled Medications:  atorvastatin   20 mg Oral q1800   digoxin   0.125 mg Oral Daily   gabapentin   300 mg Oral BID   linezolid   600 mg Oral Q12H   pantoprazole   40 mg  Oral Daily   sacubitril -valsartan   1 tablet Oral BID   spironolactone   25 mg Oral Daily   torsemide   20 mg Oral Daily   Warfarin - Pharmacist Dosing Inpatient   Does not apply q1600    Infusions:  cefTRIAXone  (ROCEPHIN )  IV 2 g (02/16/24 1629)   heparin  700 Units/hr (02/16/24 2308)    PRN Medications: acetaminophen , albuterol , HYDROmorphone  (DILAUDID ) injection, lip balm, morphine  injection, ondansetron  (ZOFRAN ) IV, oxyCODONE -acetaminophen , traMADol   Assessment/Plan:    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  Related to poor outpatient dressing care.   - CT abdomen pelvis with  contrast - unremarkable except for R breast mass (see below) - Blood Cx- NGTD  - Continue ceftriaxone  and linezolid  - Wound Cx with few proteus mirabilis, few diphtheroids.  - S/P post I&D with Dr. Daniel 1/20.  - Continue dressing changes and current abx  Chronic systolic HF with biventricular dysfunction/stage D cardiomyopathy s/p HM3 07/24/2023 with concurrent MVR - Continue speed at 6000 RPM, stable - NYHA II. Appears euvolemic.  - MAPs stable. Continue Entresto . Volume ok - Continue digoxin  0.125 mg daily, last dig level <0.6 - Continue spironolactone  25mg  daily - INR 1.1 - LDH 835>656>839. Watch closely. Discussed with pharm D. Hep gtt increased yesterday.    Severe MR s/p MVR - Echo 4/25 with severe MR  - TEER on 07/09/23 complicated by worsening MR - S/p MVR   Hx of PE/DVT w/ IVC filter - Evaluated at [facility] where thrombophilia work-up was negative (Lupus inhibitor, anticardiolipin antibodies, and anti beta-2  glycoprotein antibodies were negative; protein C activity 85%, protein S activity 76%, factor V Leiden, and prothrombin gene mutation ) - IVC filter in place; could not be removed due to significant thrombus burden on filter at [facility]  - On heparin /warfarin. INR 1.1   Obesity  - BMI significantly improved, will need GLP-1 later if considering txp - Body mass index is 36.03 kg/m.   CAD - LHC in 5/23 w/ nonobstructive CAD involving RCA - Inferior WMA on most recent echo - no cs/s angina.    Tobacco use - understands need to quit   Right breast mass on CT - malignancy vs infection - will need mammogram - Has had R breast biopsy in 2022 which was benign - CT this admit 3.6 x 3.4 cm possible malignancy or infection/abscess.     I reviewed the LVAD parameters from today, and compared the results to the patient's prior recorded data.  No programming changes were made.  The LVAD is functioning within specified parameters.  The patient performs LVAD self-test daily.  LVAD  interrogation was negative for any significant power changes, alarms or PI events/speed drops.  LVAD equipment check completed and is in good working order.  Back-up equipment present.   LVAD education done on emergency procedures and precautions and reviewed exit site care.  Length of Stay: 11  Beckey LITTIE Coe, NP 02/17/2024, 8:30 AM  VAD Team --- VAD ISSUES ONLY--- Pager [phone] (7am - 7am)  Advanced Heart Failure Team  Pager [phone] (M-F; 7a - 5p)   Please visit [email or web address]: For overnight coverage please call cardiology fellow first. If fellow not available call Shock/ECMO MD on call.  For ECMO / Mechanical Support (Impella, IABP, LVAD) issues call Shock / ECMO MD on call.   Patient seen with NP, I formulated the plan and agree with the above note.   She remains on ceftriaxone /linezolid  for driveline infection, now s/p debridement.  LDH back down to 160.    INR still low at 1.1, on heparin  gtt + warfarin. BP difficult to obtain by cuff alone.    General: Well appearing this am. NAD.  HEENT: Normal. Neck: Supple, JVP 7-8 cm. Carotids OK.  Cardiac:  Mechanical heart sounds with LVAD hum present.  Lungs:  CTAB, normal effort.  Abdomen:  NT, ND, no HSM. No bruits or masses. +BS  LVAD exit site: Site is dressed Extremities:  Warm and dry. No cyanosis, clubbing, rash, or edema.  Neuro:  Alert & oriented x 3. Cranial nerves grossly intact. Moves all 4 extremities w/o difficulty. Affect pleasant     Continue ceftriaxone  to 3/3 and linezolid  to 2/5 for driveline infection.  PICC for home for ceftriaxone .    LVAD parameters reviewed and stable.    On warfarin + heparin  gtt with INR 1.1, LDH back to normal range.  Would like to see INR higher before discharge.     Has right breast mass, this seems to be chronic with negative biopsy in 2022.  Will need workup with mammogram as outpatient.   BP difficult to obtain, will need to be dopplered.  No lightheadedness.   Ezra Shuck 02/17/2024 12:34 PM    "

## 2024-02-18 ENCOUNTER — Other Ambulatory Visit: Payer: Self-pay

## 2024-02-18 LAB — BASIC METABOLIC PANEL WITH GFR
Anion gap: 9 (ref 5–15)
BUN: 28 mg/dL — ABNORMAL HIGH (ref 6–20)
CO2: 26 mmol/L (ref 22–32)
Calcium: 9.6 mg/dL (ref 8.9–10.3)
Chloride: 100 mmol/L (ref 98–111)
Creatinine, Ser: 1.27 mg/dL — ABNORMAL HIGH (ref 0.44–1.00)
GFR, Estimated: 52 mL/min — ABNORMAL LOW
Glucose, Bld: 83 mg/dL (ref 70–99)
Potassium: 4.8 mmol/L (ref 3.5–5.1)
Sodium: 135 mmol/L (ref 135–145)

## 2024-02-18 LAB — CBC
HCT: 38.4 % (ref 36.0–46.0)
Hemoglobin: 12.5 g/dL (ref 12.0–15.0)
MCH: 30 pg (ref 26.0–34.0)
MCHC: 32.6 g/dL (ref 30.0–36.0)
MCV: 92.3 fL (ref 80.0–100.0)
Platelets: 123 10*3/uL — ABNORMAL LOW (ref 150–400)
RBC: 4.16 MIL/uL (ref 3.87–5.11)
RDW: 15.6 % — ABNORMAL HIGH (ref 11.5–15.5)
WBC: 3.1 10*3/uL — ABNORMAL LOW (ref 4.0–10.5)
nRBC: 0 % (ref 0.0–0.2)

## 2024-02-18 LAB — LACTATE DEHYDROGENASE: LDH: 180 U/L (ref 105–235)

## 2024-02-18 LAB — HEPARIN LEVEL (UNFRACTIONATED): Heparin Unfractionated: <0.1 [IU]/mL — ABNORMAL LOW (ref 0.30–0.70)

## 2024-02-18 LAB — PROTIME-INR
INR: 1.2 (ref 0.8–1.2)
Prothrombin Time: 16.2 s — ABNORMAL HIGH (ref 11.4–15.2)

## 2024-02-18 MED ORDER — WARFARIN SODIUM 5 MG PO TABS
10.0000 mg | ORAL_TABLET | Freq: Once | ORAL | Status: AC
  Administered 2024-02-18: 10 mg via ORAL
  Filled 2024-02-18: qty 2

## 2024-02-18 MED ORDER — SODIUM CHLORIDE 0.9% FLUSH: 10.0000 mL | INTRAVENOUS | Status: DC | PRN

## 2024-02-18 MED ORDER — CHLORHEXIDINE GLUCONATE CLOTH 2 % EX PADS
6.0000 | MEDICATED_PAD | Freq: Every day | CUTANEOUS | Status: DC
  Administered 2024-02-18 – 2024-02-23 (×6): 6 via TOPICAL

## 2024-02-18 NOTE — Plan of Care (Signed)
" °  Problem: Education: Goal: Patient will understand all VAD equipment and how it functions Outcome: Progressing Goal: Patient will be able to verbalize current INR target range and antiplatelet therapy for discharge home Outcome: Progressing   Problem: Cardiac: Goal: LVAD will function as expected and patient will experience no clinical alarms Outcome: Progressing   Problem: Education: Goal: Knowledge of General Education information will improve Description: Including pain rating scale, medication(s)/side effects and non-pharmacologic comfort measures Outcome: Progressing   Problem: Clinical Measurements: Goal: Diagnostic test results will improve Outcome: Progressing Goal: Respiratory complications will improve Outcome: Progressing Goal: Cardiovascular complication will be avoided Outcome: Progressing   Problem: Nutrition: Goal: Adequate nutrition will be maintained Outcome: Progressing   Problem: Elimination: Goal: Will not experience complications related to bowel motility Outcome: Progressing Goal: Will not experience complications related to urinary retention Outcome: Progressing   "

## 2024-02-18 NOTE — Progress Notes (Signed)
 Peripherally Inserted Central Catheter Placement  The IV Nurse has discussed with the patient and/or persons authorized to consent for the patient, the purpose of this procedure and the potential benefits and risks involved with this procedure.  The benefits include less needle sticks, lab draws from the catheter, and the patient may be discharged home with the catheter. Risks include, but not limited to, infection, bleeding, blood clot (thrombus formation), and puncture of an artery; nerve damage and irregular heartbeat and possibility to perform a PICC exchange if needed/ordered by physician.  Alternatives to this procedure were also discussed.  Bard Power PICC patient education guide, fact sheet on infection prevention and patient information card has been provided to patient /or left at bedside.    PICC Placement Documentation  PICC Single Lumen 02/18/24 Left Basilic 44 cm 1 cm (Active)  Indication for Insertion or Continuance of Line Prolonged intravenous therapies 02/18/24 1115  Exposed Catheter (cm) 1 cm 02/18/24 1115  Site Assessment Clean, Dry, Intact 02/18/24 1115  Line Status Flushed;Saline locked;Blood return noted 02/18/24 1115  Dressing Type Transparent;Securing device 02/18/24 1115  Dressing Status Antimicrobial disc/dressing in place;Clean, Dry, Intact 02/18/24 1115  Line Care Connections checked and tightened 02/18/24 1115  Line Adjustment (NICU/IV Team Only) No 02/18/24 1115  Dressing Intervention New dressing;Adhesive placed at insertion site (IV team only) 02/18/24 1115  Dressing Change Due 02/25/24 02/18/24 1115       Jolee Na 02/18/2024, 11:17 AM

## 2024-02-18 NOTE — Progress Notes (Signed)
 PHARMACY - ANTICOAGULATION CONSULT NOTE  Pharmacy Consult for heparin  + resume warfarin Indication: LVAD HM3 with hx PE/DVT  Allergies[1]  Patient Measurements: Height: 5' 10 (177.8 cm) Weight: 115.1 kg (253 lb 12.8 oz) IBW/kg (Calculated) : 68.5 HEPARIN  DW (KG): 94  Vital Signs: Temp: 97.7 F (36.5 C) (01/28 0404) Temp Source: Oral (01/28 0404) BP: 87/57 (01/27 2258) Pulse Rate: 88 (01/28 0404)  Labs: Recent Labs    02/16/24 0051 02/17/24 0301 02/18/24 0224  HGB 11.4* 12.3 12.5  HCT 34.8* 37.9 38.4  PLT 154 145* 123*  LABPROT 14.2 15.1 16.2*  INR 1.0 1.1 1.2  HEPARINUNFRC <0.10* <0.10* <0.10*  CREATININE 1.45* 1.17* 1.27*    Estimated Creatinine Clearance: 75.3 mL/min (A) (by C-G formula based on SCr of 1.27 mg/dL (H)).   Medical History: Past Medical History:  Diagnosis Date   Anemia    Blood transfusion without reported diagnosis    CAD (coronary artery disease)    Nonobstructive   Closed left ankle fracture    August 30 2012   Clotting disorder    DVT (deep venous thrombosis) (HCC)    L leg   Fibroid tumor    HFrEF (heart failure with reduced ejection fraction) (HCC)    Hidradenitis    Hypertension    LVAD (left ventricular assist device) present [facility])    Mitral regurgitation    Myocardial infarction (HCC)    NICM (nonischemic cardiomyopathy) (HCC)    NSVT (nonsustained ventricular tachycardia) (HCC)    Obesity    OSA on CPAP    Pulmonary embolism (HCC) 04/2013   Sleep apnea     Medications:  Scheduled:   atorvastatin   20 mg Oral q1800   digoxin   0.125 mg Oral Daily   gabapentin   300 mg Oral BID   linezolid   600 mg Oral Q12H   pantoprazole   40 mg Oral Daily   sacubitril -valsartan   1 tablet Oral BID   spironolactone   25 mg Oral Daily   torsemide   20 mg Oral Daily   warfarin  10 mg Oral ONCE-1600   Warfarin - Pharmacist Dosing Inpatient   Does not apply q1600    Assessment: 19 yof presenting with concern for driveline infection - plan  to admit for IV antibiotics and evaluation for possible I&D. On warfarin PTA for LVAD + hx DVT/PE.   PTA warfarin regimen is 5 mg daily except 7.5 mg Tues/Sat at last clinic appointment.  Heparin  running at 700 units/hr - increased 1/26 with bump in LDH 160> 300 now back to 160 today  - assume 300 was erroneous. Heparin  level this morning is <0.1 as expected with low dose heparin .   INR starting to bump 1.2 with boost dose warfarin10mg   - repeat Hgb 11-12 stable, plt 120-150s stable. Previously with oozing during dressing changes, no bleeding today per RN. Charted eating 100% of meals.   Per discussion with AHF MD, plan to continue low dose heparin  infusion while INR remains <1.5.   Goal of Therapy:  INR 2-3 Heparin  level <0.3 - fixed rate unless directed by MD Monitor platelets by anticoagulation protocol: Yes   Plan:  Continue heparin  infusion 700 units/hr fixed rate Warfarin 10 mg x 1 repeat today Monitor daily INR, HL, CBC, and for s/sx of bleeding   Olam Chalk Pharm.D. CPP, BCPS Clinical Pharmacist [phone] 02/18/2024 7:23 AM    Please check AMION for all [facility] Pharmacy phone numbers After 10:00 PM, call Main Pharmacy [phone]  02/18/2024 7:23 AM       [  1]  Allergies Allergen Reactions   Sulfa  Antibiotics Itching, Swelling and Rash    Lip swelling

## 2024-02-18 NOTE — Plan of Care (Signed)
" °  Problem: Education: Goal: Patient will understand all VAD equipment and how it functions Outcome: Progressing Goal: Patient will be able to verbalize current INR target range and antiplatelet therapy for discharge home Outcome: Progressing   Problem: Cardiac: Goal: LVAD will function as expected and patient will experience no clinical alarms Outcome: Progressing   Problem: Education: Goal: Knowledge of General Education information will improve Description: Including pain rating scale, medication(s)/side effects and non-pharmacologic comfort measures Outcome: Progressing   Problem: Health Behavior/Discharge Planning: Goal: Ability to manage health-related needs will improve Outcome: Progressing   Problem: Clinical Measurements: Goal: Ability to maintain clinical measurements within normal limits will improve Outcome: Progressing Goal: Will remain free from infection Outcome: Progressing Goal: Diagnostic test results will improve Outcome: Progressing Goal: Respiratory complications will improve Outcome: Progressing Goal: Cardiovascular complication will be avoided Outcome: Progressing   Problem: Activity: Goal: Risk for activity intolerance will decrease Outcome: Progressing   Problem: Nutrition: Goal: Adequate nutrition will be maintained Outcome: Progressing   Problem: Coping: Goal: Level of anxiety will decrease Outcome: Progressing   Problem: Elimination: Goal: Will not experience complications related to bowel motility Outcome: Progressing Goal: Will not experience complications related to urinary retention Outcome: Progressing   Problem: Pain Managment: Goal: General experience of comfort will improve and/or be controlled Outcome: Progressing   Problem: Safety: Goal: Ability to remain free from injury will improve Outcome: Progressing   Problem: Skin Integrity: Goal: Risk for impaired skin integrity will decrease Outcome: Progressing   Problem:  Bowel/Gastric: Goal: Gastrointestinal status for postoperative course will improve Outcome: Progressing   Problem: Cardiac: Goal: Ability to maintain an adequate cardiac output Outcome: Progressing Goal: Will show no evidence of cardiac arrhythmias Outcome: Progressing   Problem: Clinical Measurements: Goal: Ability to maintain clinical measurements within normal limits Outcome: Progressing Goal: Postoperative complications will be avoided or minimized Outcome: Progressing   Problem: Respiratory: Goal: Will regain and/or maintain adequate ventilation Outcome: Progressing Goal: Respiratory status will improve Outcome: Progressing   "

## 2024-02-18 NOTE — Progress Notes (Addendum)
 "  Advanced Heart Failure VAD Team Note  PCP-Cardiologist: None  Chief Complaint: LVAD infection  Patient Profile   [name] [name] is a 49 y.o. female with hx of saddle PE/DVT s/p IVC filter (2015), HFrEF/NICM, pancytopenia, nonobstructive CAD, morbid obesity, noncompliance, pancytopenia (followed by hematology), tobacco use. Admitted with LVAD DL infection  Significant events:    Admitted 1/16 for DL infection 8/79/73: S/p OR for DL debridement  8/77 : ID added linezolid . Continued on ancef .   Subjective:    On ceftriaxone  + Linezolid . Wound cx with rare proteus mirabilis+ few corynebacterium.  Some pain, especially with dressing change yesterday, but otherwise feels okay.  Worried about her mother who is dealing with her own medical issues.  Remains on heparin  gtt and warfarin. INR 1.2.   MAPs up and down, but doppler pressure consistently 70s.   HeartMate 3 VAD Equipment Check Pump Speed (RPM): 6000 RPM Pump Flow (LPM): 4.6 Power (Watts): 5 Watts Pulsatility Index: 2.5 Fixed Speed Limit: 6000 rpm Low Speed Limit: 5700 rpm Alarms: No alarms Auscultated: Normal expected humming Power Module Self-Test (Daily): Done System Controller Self-Test: Passed Patient Battery Source: [facility] / Wall unit Emergency Equipment at Bedside: Yes   Objective:   Vital Signs:   Temp:  [97.7 F (36.5 C)-98.3 F (36.8 C)] 98.1 F (36.7 C) (01/28 0757) Pulse Rate:  [66-88] 73 (01/28 0757) Resp:  [15-20] 15 (01/28 0757) BP: (80-122)/(57-103) 103/58 (01/28 0757) SpO2:  [98 %] 98 % (01/27 1908) Weight:  [115.1 kg] 115.1 kg (01/28 0404) Last BM Date : 02/17/24  Intake/Output:   Intake/Output Summary (Last 24 hours) at 02/18/2024 0848 Last data filed at 02/18/2024 0701 Gross per 24 hour  Intake 839.06 ml  Output 2350 ml  Net -1510.94 ml    Physical Exam  Physical Exam: GENERAL: no acute distress. CARDIAC:  Mechanical heart sounds with LVAD hum present.  LVAD exit site:   Dressing dry  and intact.  Stabilization device present and accurately applied.   EXTREMITIES:  No edema NEUROLOGIC:  Alert and oriented x 4.  Affect pleasant.      Tele: SR 60s-70s  Labs  Basic Metabolic Panel: Recent Labs  Lab 02/14/24 0230 02/15/24 0125 02/16/24 0051 02/17/24 0301 02/18/24 0224  NA 139 140 136 138 135  K 4.7 4.8 5.2* 4.4 4.8  CL 100 100 99 100 100  CO2 32 33* 25 28 26   GLUCOSE 86 87 147* 88 83  BUN 42* 43* 38* 30* 28*  CREATININE 1.39* 1.53* 1.45* 1.17* 1.27*  CALCIUM  9.2 9.4 9.1 9.3 9.6  MG  --   --  2.2  --   --     Liver Function Tests: No results for input(s): AST, ALT, ALKPHOS, BILITOT, PROT, ALBUMIN  in the last 168 hours. No results for input(s): LIPASE, AMYLASE in the last 168 hours. No results for input(s): AMMONIA in the last 168 hours.  CBC: Recent Labs  Lab 02/14/24 0230 02/15/24 0125 02/16/24 0051 02/17/24 0301 02/18/24 0224  WBC 4.2 4.4 4.2 3.7* 3.1*  HGB 11.4* 11.7* 11.4* 12.3 12.5  HCT 35.0* 36.2 34.8* 37.9 38.4  MCV 92.8 93.5 91.3 93.3 92.3  PLT 121* 128* 154 145* 123*    INR: Recent Labs  Lab 02/14/24 0230 02/15/24 0125 02/16/24 0051 02/17/24 0301 02/18/24 0224  INR 1.1 1.0 1.0 1.1 1.2    Medications:    Scheduled Medications:  atorvastatin   20 mg Oral q1800   digoxin   0.125 mg Oral Daily  gabapentin   300 mg Oral BID   linezolid   600 mg Oral Q12H   pantoprazole   40 mg Oral Daily   sacubitril -valsartan   1 tablet Oral BID   spironolactone   25 mg Oral Daily   torsemide   20 mg Oral Daily   warfarin  10 mg Oral ONCE-1600   Warfarin - Pharmacist Dosing Inpatient   Does not apply q1600    Infusions:  cefTRIAXone  (ROCEPHIN )  IV 2 g (02/17/24 1249)   heparin  700 Units/hr (02/18/24 0830)    PRN Medications: acetaminophen , albuterol , HYDROmorphone  (DILAUDID ) injection, lip balm, morphine  injection, ondansetron  (ZOFRAN ) IV, oxyCODONE -acetaminophen , traMADol   Assessment/Plan:    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  Related to poor outpatient dressing care.   - CT abdomen pelvis with contrast - unremarkable except for R breast mass (see below) - Blood Cx- NGTD  - S/P post I&D with Dr. Daniel 1/20. Wound Cx with few proteus mirabilis, few corynebacterium  - Continue ceftriaxone  for 6 weeks (end date 03/23/24) followed by suppression and linezolid  (end date 02/26/24). Place order for PICC line.   Chronic systolic HF with biventricular dysfunction/stage D cardiomyopathy s/p HM3 07/24/2023 with concurrent MVR - Continue speed at 6000 RPM, stable - NYHA II. Appears euvolemic.  - MAPs stable. Continue Entresto . Volume ok - Continue digoxin  0.125 mg daily, last dig level <0.6 - Continue spironolactone  25mg  daily - INR 1.2 - LDH 164>343>160>180. Heparin  gtt was increased 2 days ago d/t elevated LDH   Severe MR s/p MVR - Echo 4/25 with severe MR  - TEER on 07/09/23 complicated by worsening MR - S/p MVR   Hx of PE/DVT w/ IVC filter - Evaluated at [facility] where thrombophilia work-up was negative (Lupus inhibitor, anticardiolipin antibodies, and anti beta-2  glycoprotein antibodies were negative; protein C activity 85%, protein S activity 76%, factor V Leiden, and prothrombin gene mutation ) - IVC filter in place; could not be removed due to significant thrombus burden on filter at [facility]  - On heparin /warfarin. INR 1.2   Obesity  - BMI significantly improved, will need GLP-1 later if considering txp - Body mass index is 36.42 kg/m.   CAD - LHC in 5/23 w/ nonobstructive CAD involving RCA - Inferior WMA on most recent echo - no cs/s angina.    Tobacco use - understands need to quit   Right breast mass on CT - malignancy vs infection - will need mammogram - Has had R breast biopsy in 2022 which was benign - CT this admit 3.6 x 3.4 cm possible malignancy or infection/abscess.      Orders placed for PICC line placement.   Plan for discharge once INR  higher.  I reviewed the LVAD parameters from today, and compared the results to the patient's prior recorded data.  No programming changes were made.  The LVAD is functioning within specified parameters.  The patient performs LVAD self-test daily.  LVAD interrogation was negative for any significant power changes, alarms or PI events/speed drops.  LVAD equipment check completed and is in good working order.  Back-up equipment present.   LVAD education done on emergency procedures and precautions and reviewed exit site care.  Length of Stay: 12  FINCH, LINDSAY N, PA-C 02/18/2024, 8:48 AM  VAD Team --- VAD ISSUES ONLY--- Pager [phone] (7am - 7am)  Advanced Heart Failure Team  Pager [phone] (M-F; 7a - 5p)   Please visit [email or web address]: For overnight coverage please call cardiology fellow first. If fellow not available  call Shock/ECMO MD on call.  For ECMO / Mechanical Support (Impella, IABP, LVAD) issues call Shock / ECMO MD on call.   Patient seen with NP, I formulated the plan and agree with the above note.    She remains on ceftriaxone /linezolid  for driveline infection, now s/p debridement.     LDH stable 180.    INR still low at 1.2, on heparin  gtt + warfarin. BP difficult to obtain by cuff alone.    General: Well appearing this am. NAD.  HEENT: Normal. Neck: Supple, JVP 7-8 cm. Carotids OK.  Cardiac:  Mechanical heart sounds with LVAD hum present.  Lungs:  CTAB, normal effort.  Abdomen:  NT, ND, no HSM. No bruits or masses. +BS  LVAD exit site: Well-healed and incorporated. Dressing dry and intact. No erythema or drainage. Stabilization device present and accurately applied. Driveline dressing changed daily per sterile technique. Extremities:  Warm and dry. No cyanosis, clubbing, rash, or edema.  Neuro:  Alert & oriented x 3. Cranial nerves grossly intact. Moves all 4 extremities w/o difficulty. Affect pleasant     Continue ceftriaxone  to 3/3 and linezolid  to 2/5 for driveline  infection.  Placing PICC today.    LVAD parameters reviewed and stable.    On warfarin + heparin  gtt with INR 1.2, LDH back to normal range.  Would like to see INR higher before discharge.     Has right breast mass, this seems to be chronic with negative biopsy in 2022.  Will need workup with mammogram as outpatient.    BP difficult to obtain, will need to be dopplered.  No lightheadedness.   Family member coming in today and tomorrow to learn to do clean dressing changes.  Hopefully we can get her home on Friday.   Ezra Shuck 02/18/2024 10:18 AM    "

## 2024-02-18 NOTE — Progress Notes (Signed)
 LVAD Coordinator Rounding Note:  Admitted 02/06/24 from VAD clinic for drive line infection to advanced heart failure service  HM III LVAD implanted on 07/24/23 by Dr. Lucas under destination criteria.  Pt laying in bed on my arrival. Denies complaints. Dressing changed today at bedside. See below for documentation.  PRN pain medication administered prior to wound packing.   ID following. Plan per ID IV Ceftriaxone  once daily for 6 weeks (End date: 03/23/24), followed by suppression. Will do 2 weeks PO Linezolid  (End date: 02/26/24) to cover the Corynebacterium from the culture obtained off the velour. Orders placed for PICC today by AHF team.  Discussed importance of compliance with wound care with pt at bedside today. Her friend Skippy is willing to be trained on dressing change tomorrow and Friday.   Vital signs: Temp: 98.1 HR: 76 Doppler Pressure: 74 Automatic BP: 103/58 (73) O2 Sat: 98% on RA Wt: 251.7>250.6>247.8>252.4>254.8>251.1>252.5>253.8  lbs  LVAD interrogation reveals:  Speed: 6050 Flow: 4.7 Power: 4.7 w PI: 2.6  Alarms: none Events: 1 today Hematocrit: 35  Fixed speed: 6000 Low speed limit: 5700  Drive Line: Pt premedicated by bedside RN prior to wound care. Existing VAD dressing/packing removed and site care performed using sterile technique. Drive line exit site/incision cleaned with CHG swab x 2, allowed to dry. Packed incision with 1/2 of a large VASHE moistened kerlex. Covered Silverlon patch, with several dry 4 x 4. Red rubber with suture intact directly at exit site. Incisional sutures intact- skin well approximated. Exit site partially incorporated. Velour removed in OR, driveline significantly exposed at exit site. Moderate amount of serosanguinous drainage on previous dressing. No oozing noted with packing removal. Tenderness present with packing. No foul odor or rash noted. Anchor reapplied. Daily dressing changes by bedside nurse or VAD coordinator. Next dressing  change due 02/19/23.      Labs:  LDH trend: 179>196>181>164>343>160>180  INR trend: 1.2>1.1>1.1>1.0>1.0>1.1>1.2  Anticoagulation Plan: - INR Goal: 2.0-3.0 - ASA Dose: n/a - Coumadin  dosing per pharmacy  Arrythmias:  HX: NSVT on p.o. amiodarone   Infection: 01/04/25>> wound cx >> few eikenella corrodens 02/06/24>> blood cultures >> no growth 3 days 02/10/24>>OR wound cx>>rare proteus mirabilis 02/10/24>>OR fungal cx>>pending 02/10/24>> OR wound cx #2>> rare proteus mirabilis 02/10/24>> OR fungal cx #2>>pending 02/10/24>> OR drive line velour>> few proteus mirabilis, few diptheroids (corynebacterium) 02/10/24>> OR drive line velour fungal cx>> pending  Drips:  Heparin  700 units/hr  Adverse Events on VAD:  Plan/Recommendations:  1. Page VAD coordinator with any VAD alarms or equipment issues. 2. Daily drive line dressing changes per bedside RN or VAD coordinator  [name] Lunger RN, BSN VAD Coordinator 24/7 Pager [phone]

## 2024-02-19 LAB — CBC
HCT: 36.7 % (ref 36.0–46.0)
Hemoglobin: 12.1 g/dL (ref 12.0–15.0)
MCH: 30.2 pg (ref 26.0–34.0)
MCHC: 33 g/dL (ref 30.0–36.0)
MCV: 91.5 fL (ref 80.0–100.0)
Platelets: 108 10*3/uL — ABNORMAL LOW (ref 150–400)
RBC: 4.01 MIL/uL (ref 3.87–5.11)
RDW: 15.7 % — ABNORMAL HIGH (ref 11.5–15.5)
WBC: 3.2 10*3/uL — ABNORMAL LOW (ref 4.0–10.5)
nRBC: 0 % (ref 0.0–0.2)

## 2024-02-19 LAB — PROTIME-INR
INR: 1.4 — ABNORMAL HIGH (ref 0.8–1.2)
Prothrombin Time: 17.8 s — ABNORMAL HIGH (ref 11.4–15.2)

## 2024-02-19 LAB — LACTATE DEHYDROGENASE: LDH: 200 U/L (ref 105–235)

## 2024-02-19 LAB — HEPARIN LEVEL (UNFRACTIONATED): Heparin Unfractionated: <0.1 [IU]/mL — ABNORMAL LOW (ref 0.30–0.70)

## 2024-02-19 MED ORDER — WARFARIN SODIUM 5 MG PO TABS
10.0000 mg | ORAL_TABLET | Freq: Once | ORAL | Status: AC
  Administered 2024-02-19: 10 mg via ORAL
  Filled 2024-02-19: qty 2

## 2024-02-19 NOTE — Consult Note (Addendum)
" ° °  CLINICAL SUPPORT TEAM - WOUND OSTOMY AND CONTINENCE TEAM  CONSULTATION SERVICES   WOC Nurse-Inpatient Note   WOC Nurse Consult Note: Reason for Consult: Consult requested to assist with possible topical treatment for a full thickness post-op wound in close proximity to the LVAD insertion site.  Assessed wound and discussed plan of care with LVAD team at the bedside, while [name] performed the dressing change procedure.  [name], NP also at the bedside. Wound is .5X2X9 cm and tunnels underneath the skin towards the right where the drive line is located.  Red and moist, sutures surrounding the location. Recommend Aquacel; this dressing is antimicrobial and absorbs drainage and has reinforced stitching to provide strength when used as a packing strip.   Topical treatment orders provided as follows: Each time LVAD drive line dressing is changed, apply dressing to left wound site as follows: Cut 1 piece of Aquacel (#[id number]) into a spiral shape like a cinnamon roll, then tuck the strip into the wound, using a swab to fill.  Cover with the LVAD outer dressing kit.  WOC will plan to meet LVAD team tomorrow when the dressing change is performed to assess for effectiveness of the dressing.   Thank-you,  Stephane Fought MSN, RN, CWOCN, CWCN-AP, CNS Contact Mon-Fri 0700-1500: [phone]       "

## 2024-02-19 NOTE — Progress Notes (Addendum)
 "  Advanced Heart Failure VAD Team Note  PCP-Cardiologist: None  Chief Complaint: LVAD infection  Patient Profile   [name] [name] is a 49 y.o. female with hx of saddle PE/DVT s/p IVC filter (2015), HFrEF/NICM, pancytopenia, nonobstructive CAD, morbid obesity, noncompliance, pancytopenia (followed by hematology), tobacco use. Admitted with LVAD DL infection  Significant events:    Admitted 1/16 for DL infection 8/79/73: S/p OR for DL debridement  8/77 : ID added linezolid . Continued on ancef .   Subjective:    On ceftriaxone  + Linezolid . Wound cx with rare proteus mirabilis+ few corynebacterium.    Remains on heparin  gtt and warfarin. INR 1.4   HeartMate 3 VAD Equipment Check Pump Speed (RPM): 6000 RPM Pump Flow (LPM): 4.4 Power (Watts): 5 Watts Pulsatility Index: 3.3 Fixed Speed Limit: 6000 rpm Low Speed Limit: 5700 rpm Alarms: No alarms Auscultated: Normal expected humming Power Module Self-Test (Daily): Done System Controller Self-Test: Passed Patient Battery Source: [facility] / Wall unit Emergency Equipment at Bedside: Yes   Wants to go home but understands a family member needs to be trained on dressing change.   Objective:   Vital Signs:   Temp:  [97.8 F (36.6 C)-98.6 F (37 C)] 98.6 F (37 C) (01/29 0735) Pulse Rate:  [63-72] 63 (01/29 0735) Resp:  [17-20] 17 (01/29 0735) BP: (79-121)/(55-74) 103/73 (01/29 0735) Weight:  [116.1 kg] 116.1 kg (01/29 0400) Last BM Date : 02/18/24  Intake/Output:   Intake/Output Summary (Last 24 hours) at 02/19/2024 1002 Last data filed at 02/19/2024 0432 Gross per 24 hour  Intake 488.35 ml  Output 1650 ml  Net -1161.65 ml   Maps 70s  Physical Exam   Physical Exam: GENERAL: No acute distress. Tearful.  HEENT: normal  NECK: Supple, JVP flat  CARDIAC:  Mechanical heart sounds with LVAD hum present.  LUNGS:  Clear to auscultation bilaterally.  ABDOMEN:  Soft, round, nontender LVAD exit site:  Dressing dry and intact.   EXTREMITIES:  Warm and dry. LUE PICC  NEUROLOGIC:  Alert and oriented x 3.      Tele: SR 60-70s   Labs  Basic Metabolic Panel: Recent Labs  Lab 02/14/24 0230 02/15/24 0125 02/16/24 0051 02/17/24 0301 02/18/24 0224  NA 139 140 136 138 135  K 4.7 4.8 5.2* 4.4 4.8  CL 100 100 99 100 100  CO2 32 33* 25 28 26   GLUCOSE 86 87 147* 88 83  BUN 42* 43* 38* 30* 28*  CREATININE 1.39* 1.53* 1.45* 1.17* 1.27*  CALCIUM  9.2 9.4 9.1 9.3 9.6  MG  --   --  2.2  --   --     Liver Function Tests: No results for input(s): AST, ALT, ALKPHOS, BILITOT, PROT, ALBUMIN  in the last 168 hours. No results for input(s): LIPASE, AMYLASE in the last 168 hours. No results for input(s): AMMONIA in the last 168 hours.  CBC: Recent Labs  Lab 02/14/24 0230 02/15/24 0125 02/16/24 0051 02/17/24 0301 02/18/24 0224  WBC 4.2 4.4 4.2 3.7* 3.1*  HGB 11.4* 11.7* 11.4* 12.3 12.5  HCT 35.0* 36.2 34.8* 37.9 38.4  MCV 92.8 93.5 91.3 93.3 92.3  PLT 121* 128* 154 145* 123*    INR: Recent Labs  Lab 02/15/24 0125 02/16/24 0051 02/17/24 0301 02/18/24 0224 02/19/24 0409  INR 1.0 1.0 1.1 1.2 1.4*    Medications:    Scheduled Medications:  atorvastatin   20 mg Oral q1800   Chlorhexidine  Gluconate Cloth  6 each Topical Daily   digoxin   0.125  mg Oral Daily   gabapentin   300 mg Oral BID   linezolid   600 mg Oral Q12H   pantoprazole   40 mg Oral Daily   sacubitril -valsartan   1 tablet Oral BID   spironolactone   25 mg Oral Daily   torsemide   20 mg Oral Daily   Warfarin - Pharmacist Dosing Inpatient   Does not apply q1600    Infusions:  cefTRIAXone  (ROCEPHIN )  IV 2 g (02/18/24 1415)   heparin  700 Units/hr (02/18/24 1710)    PRN Medications: acetaminophen , albuterol , HYDROmorphone  (DILAUDID ) injection, lip balm, morphine  injection, ondansetron  (ZOFRAN ) IV, oxyCODONE -acetaminophen , sodium chloride  flush, traMADol   Assessment/Plan:    Driveline infection: Has failed conservative  management with outpatient oral antibiotics.  Related to poor outpatient dressing care.   - CT abdomen pelvis with contrast - unremarkable except for R breast mass (see below) - Blood Cx- NGTD  - S/P post I&D with Dr. Daniel 1/20. Wound Cx with few proteus mirabilis, few corynebacterium  - Continue ceftriaxone  for 6 weeks (end date 03/23/24) followed by suppression and linezolid  (end date 02/26/24).  -PICC placed.  Home Health ordered.   Chronic systolic HF with biventricular dysfunction/stage D cardiomyopathy s/p HM3 07/24/2023 with concurrent MVR - Continue speed at 6000 RPM, stable - NYHA II. Appears euvolemic.  - MAPs stable.  - Continue digoxin  0.125 mg daily, last dig level <0.6 - Continue entresto   - Continue spironolactone  25mg  daily - INR 1.2> 1.4  - LDH 164>343>160>180> pending.  -Heparin  gtt was increased 2 days ago d/t elevated LDH   Severe MR s/p MVR - Echo 4/25 with severe MR  - TEER on 07/09/23 complicated by worsening MR - S/p MVR   Hx of PE/DVT w/ IVC filter - Evaluated at [facility] where thrombophilia work-up was negative (Lupus inhibitor, anticardiolipin antibodies, and anti beta-2  glycoprotein antibodies were negative; protein C activity 85%, protein S activity 76%, factor V Leiden, and prothrombin gene mutation ) - IVC filter in place; could not be removed due to significant thrombus burden on filter at [facility].  - On heparin /warfarin. INR 1.4    Obesity  - BMI significantly improved, will need GLP-1 later if considering txp - Body mass index is 36.72 kg/m.   CAD - LHC in 5/23 w/ nonobstructive CAD involving RCA - Inferior WMA on most recent echo - No chest pain.    Tobacco use - understands need to quit   Right breast mass on CT - malignancy vs infection - will need mammogram - Has had R breast biopsy in 2022 which was benign - CT this admit 3.6 x 3.4 cm possible malignancy or infection/abscess.   Check LDH, CBC, INR daily   Reached out to SW to assist with family  transportation to clinic. Place order for [facility]    I reviewed the LVAD parameters from today, and compared the results to the patient's prior recorded data.  No programming changes were made.  The LVAD is functioning within specified parameters.  The patient performs LVAD self-test daily.  LVAD interrogation was negative for any significant power changes, alarms or PI events/speed drops.  LVAD equipment check completed and is in good working order.  Back-up equipment present.   LVAD education done on emergency procedures and precautions and reviewed exit site care.  Length of Stay: 13  [name], NP 02/19/2024, 10:02 AM  VAD Team --- VAD ISSUES ONLY--- Pager [phone] (7am - 7am)  Advanced Heart Failure Team  Pager [phone] (M-F; 7a - 5p)   Please  visit [email or web address]: For overnight coverage please call cardiology fellow first. If fellow not available call Shock/ECMO MD on call.  For ECMO / Mechanical Support (Impella, IABP, LVAD) issues call Shock / ECMO MD on call.   Patient seen with NP, I formulated the plan and agree with the above note.    She remains on ceftriaxone /linezolid  for driveline infection (Proteus, corynebacterium on cultures), now s/p debridement.     LDH pending.    INR still low at 1.4, on heparin  gtt + warfarin. BP difficult to obtain by cuff alone.    General: Well appearing this am. NAD.  HEENT: Normal. Neck: Supple, JVP 7-8 cm. Carotids OK.  Cardiac:  Mechanical heart sounds with LVAD hum present.  Lungs:  CTAB, normal effort.  Abdomen:  NT, ND, no HSM. No bruits or masses. +BS  LVAD exit site: Well-healed and incorporated. Dressing dry and intact. No erythema or drainage. Stabilization device present and accurately applied. Driveline dressing changed daily per sterile technique. Extremities:  Warm and dry. No cyanosis, clubbing, rash, or edema.  Neuro:  Alert & oriented x 3. Cranial nerves grossly intact. Moves all 4 extremities w/o difficulty. Affect pleasant      Continue ceftriaxone  to 3/3 and linezolid  to 2/5 for driveline infection. Has PICC.    LVAD parameters reviewed and stable.    On warfarin + heparin  gtt with INR 1.4, LDH back to normal range yesterday.  Would like to see INR higher before discharge.  Pending LDH today.    Has right breast mass, this seems to be chronic with negative biopsy in 2022.  Will need workup with mammogram as outpatient.    BP difficult to obtain, will need to be dopplered.  No lightheadedness.    Family member needs to come in today to learn to do clean dressing changes.  Hopefully we can get her home on Friday.   Ezra Shuck 02/19/2024 10:31 AM    "

## 2024-02-19 NOTE — Progress Notes (Addendum)
 LVAD Coordinator Rounding Note:  Admitted 02/06/24 from VAD clinic for drive line infection to advanced heart failure service  HM III LVAD implanted on 07/24/23 by Dr. Lucas under destination criteria.  Pt laying in bed on my arrival. Denies complaints. Dressing changed today at bedside. See below for documentation.  PRN pain medication administered prior to wound packing.   ID following. Plan per ID IV Ceftriaxone  once daily for 6 weeks (End date: 03/23/24), followed by suppression. Will do 2 weeks PO Linezolid  (End date: 02/26/24) to cover the Corynebacterium from the culture obtained off the velour. Orders placed for PICC today by AHF team. Holley Herring provided home IV antibiotic teaching today with patient at bedside.   Discussed importance of compliance with wound care with pt at bedside today. Wound care nurse assisted with dressing change today.  Vital signs: Temp: 98.6 HR: 63 Doppler Pressure: 76 Automatic BP: 103/73 (83) O2 Sat: 98% on RA Wt: 251.7>250.6>247.8>252.4>254.8>251.1>252.5>253.8>255.9  lbs  LVAD interrogation reveals:  Speed: 6050 Flow: 4.5 Power: 4.8 w PI: 3.3  Alarms: none Events: 1 today Hematocrit: 35  Fixed speed: 6000 Low speed limit: 5700  Drive Line: Pt premedicated by bedside RN prior to wound care. Existing VAD dressing/packing removed and site care performed using sterile technique. Drive line exit site/incision cleaned with CHG swab x 2, allowed to dry. Packed incision with large Aquacel strip. Covered Silverlon patch, with several dry 4 x 4. Red rubber with suture intact directly at exit site. Incisional sutures intact- skin well approximated. Exit site partially incorporated. Velour removed in OR, driveline significantly exposed at exit site. Moderate amount of serosanguinous drainage on previous dressing. No oozing noted with packing removal. Tenderness present with packing. No foul odor or rash noted. Anchor reapplied. Daily dressing changes by bedside  nurse or VAD coordinator. Next dressing change due 02/20/23.      Labs:  LDH trend: 179>196>181>164>343>160>180  INR trend: 1.2>1.1>1.1>1.0>1.0>1.1>1.2>1.4  Anticoagulation Plan: - INR Goal: 2.0-3.0 - ASA Dose: n/a - Coumadin  dosing per pharmacy  Arrythmias:  HX: NSVT on p.o. amiodarone   Infection: 01/04/25>> wound cx >> few eikenella corrodens 02/06/24>> blood cultures >> no growth 3 days 02/10/24>>OR wound cx>>rare proteus mirabilis 02/10/24>>OR fungal cx>>no fungus observed  02/10/24>> OR wound cx #2>> rare proteus mirabilis 02/10/24>> OR fungal cx #2>>no fungus observed  02/10/24>> OR drive line velour>> few proteus mirabilis, few diptheroids (corynebacterium) 02/10/24>> OR drive line velour fungal cx>> pending  Drips:  Heparin  700 units/hr  Adverse Events on VAD:  Plan/Recommendations:  1. Page VAD coordinator with any VAD alarms or equipment issues. 2. Daily drive line dressing changes per bedside RN or VAD coordinator  [name] Lunger RN, BSN VAD Coordinator 24/7 Pager [phone]

## 2024-02-19 NOTE — Plan of Care (Signed)
" °  Problem: Education: Goal: Patient will understand all VAD equipment and how it functions Outcome: Progressing Goal: Patient will be able to verbalize current INR target range and antiplatelet therapy for discharge home Outcome: Progressing   Problem: Cardiac: Goal: LVAD will function as expected and patient will experience no clinical alarms Outcome: Progressing   Problem: Education: Goal: Knowledge of General Education information will improve Description: Including pain rating scale, medication(s)/side effects and non-pharmacologic comfort measures Outcome: Progressing   Problem: Health Behavior/Discharge Planning: Goal: Ability to manage health-related needs will improve Outcome: Progressing   Problem: Clinical Measurements: Goal: Ability to maintain clinical measurements within normal limits will improve Outcome: Progressing Goal: Will remain free from infection Outcome: Progressing Goal: Diagnostic test results will improve Outcome: Progressing Goal: Respiratory complications will improve Outcome: Progressing Goal: Cardiovascular complication will be avoided Outcome: Progressing   Problem: Nutrition: Goal: Adequate nutrition will be maintained Outcome: Progressing   Problem: Coping: Goal: Level of anxiety will decrease Outcome: Progressing   Problem: Elimination: Goal: Will not experience complications related to bowel motility Outcome: Progressing Goal: Will not experience complications related to urinary retention Outcome: Progressing   Problem: Pain Managment: Goal: General experience of comfort will improve and/or be controlled Outcome: Progressing   Problem: Safety: Goal: Ability to remain free from injury will improve Outcome: Progressing   Problem: Skin Integrity: Goal: Risk for impaired skin integrity will decrease Outcome: Progressing   Problem: Bowel/Gastric: Goal: Gastrointestinal status for postoperative course will improve Outcome:  Progressing   Problem: Cardiac: Goal: Ability to maintain an adequate cardiac output Outcome: Progressing Goal: Will show no evidence of cardiac arrhythmias Outcome: Progressing   Problem: Clinical Measurements: Goal: Ability to maintain clinical measurements within normal limits Outcome: Progressing Goal: Postoperative complications will be avoided or minimized Outcome: Progressing   Problem: Respiratory: Goal: Will regain and/or maintain adequate ventilation Outcome: Progressing Goal: Respiratory status will improve Outcome: Progressing   "

## 2024-02-19 NOTE — TOC Progression Note (Addendum)
 Transition of Care (TOC) - Progression Note    Received message, anticipated discharge tomorrow 02/20/24. [facility]coe will provide IV Antibiotics and nurse for PICC line care and labs. NCM asked to  arrange [facility] for dressing changes. . Family will be taught dressing changes.  NCM sent referrals. Awaiting for determinations.   [place] with [facility] declined due to insurance.   Lynette with [facility] declined due to insurance.   Cory with [facility] cannot accept due to staffing   [facility] declined.  [facility] declined   Burnard with [facility] declined referral , address is out of their service area   [place] with [facility] declined referral   Orie with [facility] declined referral   Jon with [facility] declined due to staffing   Artavia with [facility] declined referral due to staffing   [name] Lunger and Amy Clegg aware . NCM asked bedside nurse to give patient some dressing supplies at discharge   Patient aware, she has medicaid transportation Monday through Friday to go to clinic. No transportation on weekends. Mother cannot assist with wound care . Her mother cannot assist with wound care at home, she has no one else to assist.   She will schedule appointment with PCP Notified Darolyn and Greig Lango with [facility] has provided teaching  Patient Details  Name: [name] [name] MRN: [id number] Date of Birth: June 26, 1975  Transition [facility] Care [facility]) CM/SW Contact  Kilian Schwartz  Earnie, RN Phone Number: 02/19/2024, 12:05 PM  Clinical Narrative:       Expected Discharge Plan: Home/Self Care Barriers to Discharge: Continued Medical Work up               Expected Discharge Plan and Services       Living arrangements for the past 2 months: Single Family Home                                       Social Drivers of Health (SDOH) Interventions SDOH Screenings   Food Insecurity: No Food Insecurity (02/07/2024)  Housing: Low Risk (02/07/2024)  Transportation Needs: Unmet  Transportation Needs (02/07/2024)  Utilities: Not At Risk (02/07/2024)  Alcohol  Screen: Low Risk (12/15/2023)  Depression (PHQ2-9): Low Risk (12/16/2023)  Financial Resource Strain: Low Risk (12/15/2023)  Physical Activity: Insufficiently Active (12/15/2023)  Social Connections: Moderately Isolated (12/15/2023)  Stress: No Stress Concern Present (12/15/2023)  Tobacco Use: High Risk (02/10/2024)    Readmission Risk Interventions    07/03/2022   12:12 PM 02/13/2022   12:41 PM 12/31/2021    1:52 PM  Readmission Risk Prevention Plan  Transportation Screening Complete Complete   HRI or Home Care Consult Complete Complete Complete  Social Work Consult for Recovery Care Planning/Counseling Complete Complete Complete  Palliative Care Screening Not Applicable Not Applicable Not Applicable  Medication Review Oceanographer) Complete Complete Complete

## 2024-02-19 NOTE — TOC Progression Note (Addendum)
 Transition of Care [facility]) - Progression Note    Patient Details  Name: [name] [name] MRN: [id number] Date of Birth: 27-Mar-1975  Transition of [facility]) CM/SW Contact  [name] Phone Number: [phone] 02/19/2024, 9:17 AM  Clinical Narrative:    Per chart review, patient is not medically stable for dc. ICM will continue to follow.   HF CSW met with patient at bedside to discuss transportation plan for her cousin to be here to assist and receive wound care education. Patient stated that her child's father will be bringing her cousin up here. CSW left waiver form at bedside and left number for patient to call if the plan changes. Will wait for updates if needed.    HF CSW/CM will continue to follow and monitor for dc readiness.     Expected Discharge Plan: Home/Self Care Barriers to Discharge: Continued Medical Work up               Expected Discharge Plan and Services       Living arrangements for the past 2 months: Single Family Home                                       Social Drivers of Health (SDOH) Interventions SDOH Screenings   Food Insecurity: No Food Insecurity (02/07/2024)  Housing: Low Risk (02/07/2024)  Transportation Needs: Unmet Transportation Needs (02/07/2024)  Utilities: Not At Risk (02/07/2024)  Alcohol  Screen: Low Risk (12/15/2023)  Depression (PHQ2-9): Low Risk (12/16/2023)  Financial Resource Strain: Low Risk (12/15/2023)  Physical Activity: Insufficiently Active (12/15/2023)  Social Connections: Moderately Isolated (12/15/2023)  Stress: No Stress Concern Present (12/15/2023)  Tobacco Use: High Risk (02/10/2024)    Readmission Risk Interventions    07/03/2022   12:12 PM 02/13/2022   12:41 PM 12/31/2021    1:52 PM  Readmission Risk Prevention Plan  Transportation Screening Complete Complete   HRI or Home Care Consult Complete Complete Complete  Social Work Consult for Recovery Care Planning/Counseling Complete  Complete Complete  Palliative Care Screening Not Applicable Not Applicable Not Applicable  Medication Review Oceanographer) Complete Complete Complete

## 2024-02-19 NOTE — Progress Notes (Signed)
 PHARMACY - ANTICOAGULATION CONSULT NOTE  Pharmacy Consult for heparin  + resume warfarin Indication: LVAD HM3 with hx PE/DVT  Allergies[1]  Patient Measurements: Height: 5' 10 (177.8 cm) Weight: 116.1 kg (255 lb 14.4 oz) IBW/kg (Calculated) : 68.5 HEPARIN  DW (KG): 94  Vital Signs: Temp: 99 F (37.2 C) (01/29 1140) Temp Source: Oral (01/29 1140) BP: 96/67 (01/29 1140) Pulse Rate: 74 (01/29 1140)  Labs: Recent Labs    02/17/24 0301 02/18/24 0224 02/19/24 0409 02/19/24 1031  HGB 12.3 12.5  --  12.1  HCT 37.9 38.4  --  36.7  PLT 145* 123*  --  108*  LABPROT 15.1 16.2* 17.8*  --   INR 1.1 1.2 1.4*  --   HEPARINUNFRC <0.10* <0.10* <0.10*  --   CREATININE 1.17* 1.27*  --   --     Estimated Creatinine Clearance: 75.6 mL/min (A) (by C-G formula based on SCr of 1.27 mg/dL (H)).   Medical History: Past Medical History:  Diagnosis Date   Anemia    Blood transfusion without reported diagnosis    CAD (coronary artery disease)    Nonobstructive   Closed left ankle fracture    August 30 2012   Clotting disorder    DVT (deep venous thrombosis) (HCC)    L leg   Fibroid tumor    HFrEF (heart failure with reduced ejection fraction) (HCC)    Hidradenitis    Hypertension    LVAD (left ventricular assist device) present [facility])    Mitral regurgitation    Myocardial infarction (HCC)    NICM (nonischemic cardiomyopathy) (HCC)    NSVT (nonsustained ventricular tachycardia) (HCC)    Obesity    OSA on CPAP    Pulmonary embolism (HCC) 04/2013   Sleep apnea     Medications:  Scheduled:   atorvastatin   20 mg Oral q1800   Chlorhexidine  Gluconate Cloth  6 each Topical Daily   digoxin   0.125 mg Oral Daily   gabapentin   300 mg Oral BID   linezolid   600 mg Oral Q12H   pantoprazole   40 mg Oral Daily   sacubitril -valsartan   1 tablet Oral BID   spironolactone   25 mg Oral Daily   torsemide   20 mg Oral Daily   warfarin  10 mg Oral ONCE-1600   Warfarin - Pharmacist Dosing Inpatient    Does not apply q1600    Assessment: 16 yof presenting with concern for driveline infection - plan to admit for IV antibiotics and evaluation for possible I&D. On warfarin PTA for LVAD + hx DVT/PE.   PTA warfarin regimen is 5 mg daily except 7.5 mg Tues/Sat at last clinic appointment.  Heparin  running at 700 units/hr - increased 1/26 with bump in LDH 160> 300 now back to 160  - assume 300 was erroneous. Heparin  level this morning is <0.1 as expected with low dose heparin .   INR starting to bump 1.2 with boost dose warfarin10mg   - repeat Hgb 11-12 stable, plt 120-150s stable. Previously with oozing during dressing changes, no bleeding today per RN. Charted eating 100% of meals.   Per discussion with AHF MD, plan to continue low dose heparin  infusion while INR remains <1.5.   Goal of Therapy:  INR 2-3 Heparin  level <0.3 - fixed rate unless directed by MD Monitor platelets by anticoagulation protocol: Yes   Plan:  Continue heparin  infusion 700 units/hr fixed rate Warfarin 10 mg x 1 repeat today Monitor daily INR, HL, CBC, and for s/sx of bleeding  Harlene Barlow, Pharm  D, BCPS, BCCP Clinical Pharmacist  02/19/2024 2:35 PM   [facility] pharmacy phone numbers are listed on [email or web address]       [1]  Allergies Allergen Reactions   Sulfa  Antibiotics Itching, Swelling and Rash    Lip swelling

## 2024-02-20 LAB — BASIC METABOLIC PANEL WITH GFR
Anion gap: 9 (ref 5–15)
BUN: 29 mg/dL — ABNORMAL HIGH (ref 6–20)
CO2: 26 mmol/L (ref 22–32)
Calcium: 8.9 mg/dL (ref 8.9–10.3)
Chloride: 102 mmol/L (ref 98–111)
Creatinine, Ser: 1.2 mg/dL — ABNORMAL HIGH (ref 0.44–1.00)
GFR, Estimated: 56 mL/min — ABNORMAL LOW
Glucose, Bld: 83 mg/dL (ref 70–99)
Potassium: 4.9 mmol/L (ref 3.5–5.1)
Sodium: 137 mmol/L (ref 135–145)

## 2024-02-20 LAB — CBC
HCT: 33.6 % — ABNORMAL LOW (ref 36.0–46.0)
Hemoglobin: 10.8 g/dL — ABNORMAL LOW (ref 12.0–15.0)
MCH: 30.2 pg (ref 26.0–34.0)
MCHC: 32.1 g/dL (ref 30.0–36.0)
MCV: 93.9 fL (ref 80.0–100.0)
Platelets: 97 10*3/uL — ABNORMAL LOW (ref 150–400)
RBC: 3.58 MIL/uL — ABNORMAL LOW (ref 3.87–5.11)
RDW: 15.6 % — ABNORMAL HIGH (ref 11.5–15.5)
WBC: 2.7 10*3/uL — ABNORMAL LOW (ref 4.0–10.5)
nRBC: 0 % (ref 0.0–0.2)

## 2024-02-20 LAB — PROTIME-INR
INR: 1.6 — ABNORMAL HIGH (ref 0.8–1.2)
Prothrombin Time: 19.7 s — ABNORMAL HIGH (ref 11.4–15.2)

## 2024-02-20 LAB — HEPARIN LEVEL (UNFRACTIONATED): Heparin Unfractionated: <0.1 [IU]/mL — ABNORMAL LOW (ref 0.30–0.70)

## 2024-02-20 LAB — LACTATE DEHYDROGENASE: LDH: 172 U/L (ref 105–235)

## 2024-02-20 MED ORDER — WARFARIN SODIUM 7.5 MG PO TABS
7.5000 mg | ORAL_TABLET | Freq: Once | ORAL | Status: AC
  Administered 2024-02-20: 7.5 mg via ORAL
  Filled 2024-02-20: qty 1

## 2024-02-20 NOTE — TOC Progression Note (Signed)
 Transition of Care [facility]) - Progression Note    Patient Details  Name: [name] [name] MRN: [id number] Date of Birth: 1975/09/03  [facility]) CM/SW Contact  [name] Phone Number: [phone] 02/20/2024, 11:40 AM  Clinical Narrative:   HF CSW followed up with patient about family support with dressing changes. Patient stated that she does not have any family to assist.    HF CSW/CM will continue to follow up and monitor for dc readiness.     Expected Discharge Plan: Home/Self Care Barriers to Discharge: Continued Medical Work up               Expected Discharge Plan and Services       Living arrangements for the past 2 months: Single Family Home                                       Social Drivers of Health (SDOH) Interventions SDOH Screenings   Food Insecurity: No Food Insecurity (02/07/2024)  Housing: Low Risk (02/07/2024)  Transportation Needs: Unmet Transportation Needs (02/07/2024)  Utilities: Not At Risk (02/07/2024)  Alcohol  Screen: Low Risk (12/15/2023)  Depression (PHQ2-9): Low Risk (12/16/2023)  Financial Resource Strain: Low Risk (12/15/2023)  Physical Activity: Insufficiently Active (12/15/2023)  Social Connections: Moderately Isolated (12/15/2023)  Stress: No Stress Concern Present (12/15/2023)  Tobacco Use: High Risk (02/10/2024)    Readmission Risk Interventions    07/03/2022   12:12 PM 02/13/2022   12:41 PM 12/31/2021    1:52 PM  Readmission Risk Prevention Plan  Transportation Screening Complete Complete   HRI or Home Care Consult Complete Complete Complete  Social Work Consult for Recovery Care Planning/Counseling Complete Complete Complete  Palliative Care Screening Not Applicable Not Applicable Not Applicable  Medication Review Oceanographer) Complete Complete Complete

## 2024-02-20 NOTE — Progress Notes (Addendum)
 "  Advanced Heart Failure VAD Team Note  PCP-Cardiologist: None  Chief Complaint: LVAD infection  Patient Profile   [name] [name] is a 49 y.o. female with hx of saddle PE/DVT s/p IVC filter (2015), HFrEF/NICM, pancytopenia, nonobstructive CAD, morbid obesity, noncompliance, pancytopenia (followed by hematology), tobacco use. Admitted with LVAD DL infection  Significant events:    Admitted 1/16 for DL infection 8/79/73: S/p OR for DL debridement  8/77 : ID added linezolid . Continued on ancef .   Subjective:    On ceftriaxone  + Linezolid . Wound cx with rare proteus mirabilis+ few corynebacterium.    Remains on heparin  gtt and warfarin. INR 1.6  HeartMate 3 VAD Equipment Check Pump Speed (RPM): 6000 RPM Pump Flow (LPM): 4.7 Power (Watts): 5 Watts Pulsatility Index: 3.2 Fixed Speed Limit: 6000 rpm Low Speed Limit: 5700 rpm Alarms: No alarms Auscultated: Normal expected humming Power Module Self-Test (Daily): Done System Controller Self-Test: Passed Patient Battery Source: [facility] / Wall unit Emergency Equipment at Bedside: Yes X1 PI event this morning  Feels fine this morning, no complaints.   Objective:   Vital Signs:   Temp:  [97.6 F (36.4 C)-99.3 F (37.4 C)] 99.3 F (37.4 C) (01/30 0727) Pulse Rate:  [66-79] 79 (01/30 0727) Resp:  [15-20] 16 (01/30 0727) BP: (79-112)/(55-74) 112/74 (01/30 0727) SpO2:  [97 %-99 %] 97 % (01/30 0235) Weight:  [117.1 kg] 117.1 kg (01/30 0601) Last BM Date : 02/18/24  Intake/Output:   Intake/Output Summary (Last 24 hours) at 02/20/2024 0958 Last data filed at 02/20/2024 0733 Gross per 24 hour  Intake 240 ml  Output 1550 ml  Net -1310 ml   Maps 80s  Physical Exam  General:  Well appearing. No resp difficulty Neck:  JVP flat.  Cor: Mechanical heart sounds with LVAD hum present. Lungs: Clear Driveline: C/D/I; securement device intact and driveline incorporated Extremities: no edema Neuro: alert & oriented x3. Affect pleasant        Tele: SR 60-70s (Personally reviewed)    Labs  Basic Metabolic Panel: Recent Labs  Lab 02/15/24 0125 02/16/24 0051 02/17/24 0301 02/18/24 0224 02/20/24 0231  NA 140 136 138 135 137  K 4.8 5.2* 4.4 4.8 4.9  CL 100 99 100 100 102  CO2 33* 25 28 26 26   GLUCOSE 87 147* 88 83 83  BUN 43* 38* 30* 28* 29*  CREATININE 1.53* 1.45* 1.17* 1.27* 1.20*  CALCIUM  9.4 9.1 9.3 9.6 8.9  MG  --  2.2  --   --   --     Liver Function Tests: No results for input(s): AST, ALT, ALKPHOS, BILITOT, PROT, ALBUMIN  in the last 168 hours. No results for input(s): LIPASE, AMYLASE in the last 168 hours. No results for input(s): AMMONIA in the last 168 hours.  CBC: Recent Labs  Lab 02/16/24 0051 02/17/24 0301 02/18/24 0224 02/19/24 1031 02/20/24 0231  WBC 4.2 3.7* 3.1* 3.2* 2.7*  HGB 11.4* 12.3 12.5 12.1 10.8*  HCT 34.8* 37.9 38.4 36.7 33.6*  MCV 91.3 93.3 92.3 91.5 93.9  PLT 154 145* 123* 108* 97*    INR: Recent Labs  Lab 02/16/24 0051 02/17/24 0301 02/18/24 0224 02/19/24 0409 02/20/24 0231  INR 1.0 1.1 1.2 1.4* 1.6*    Medications:    Scheduled Medications:  atorvastatin   20 mg Oral q1800   Chlorhexidine  Gluconate Cloth  6 each Topical Daily   digoxin   0.125 mg Oral Daily   gabapentin   300 mg Oral BID   linezolid   600 mg  Oral Q12H   pantoprazole   40 mg Oral Daily   sacubitril -valsartan   1 tablet Oral BID   spironolactone   25 mg Oral Daily   torsemide   20 mg Oral Daily   Warfarin - Pharmacist Dosing Inpatient   Does not apply q1600    Infusions:  cefTRIAXone  (ROCEPHIN )  IV 2 g (02/19/24 1411)   heparin  700 Units/hr (02/19/24 2213)    PRN Medications: acetaminophen , albuterol , HYDROmorphone  (DILAUDID ) injection, lip balm, morphine  injection, ondansetron  (ZOFRAN ) IV, oxyCODONE -acetaminophen , sodium chloride  flush, traMADol   Assessment/Plan:    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  Related to poor  outpatient dressing care.   - CT abdomen pelvis with contrast - unremarkable except for R breast mass (see below) - Blood Cx- NGTD  - S/P post I&D with Dr. Daniel 1/20. Wound Cx with few proteus mirabilis, few corynebacterium  - Continue ceftriaxone  for 6 weeks (end date 03/23/24) followed by suppression and linezolid  (end date 02/26/24).  - PICC placed.  Home Health ordered.   Chronic systolic HF with biventricular dysfunction/stage D cardiomyopathy s/p HM3 07/24/2023 with concurrent MVR - Continue speed at 6000 RPM, stable - NYHA II. Appears euvolemic.  - MAPs stable.  - Continue digoxin  0.125 mg daily, last dig level <0.6 - Continue entresto   - Continue spironolactone  25mg  daily - INR 1.6. Stop hep gtt.  - LDH 164>343>160>180> pending.    Severe MR s/p MVR - Echo 4/25 with severe MR  - TEER on 07/09/23 complicated by worsening MR - S/p MVR   Hx of PE/DVT w/ IVC filter - Evaluated at [facility] where thrombophilia work-up was negative (Lupus inhibitor, anticardiolipin antibodies, and anti beta-2  glycoprotein antibodies were negative; protein C activity 85%, protein S activity 76%, factor V Leiden, and prothrombin gene mutation ) - IVC filter in place; could not be removed due to significant thrombus burden on filter at [facility].  - On heparin /warfarin. INR 1.6. Stop heparin .    Obesity  - BMI significantly improved, will need GLP-1 later if considering txp - Body mass index is 37.04 kg/m.   CAD - LHC in 5/23 w/ nonobstructive CAD involving RCA - Inferior WMA on most recent echo - No chest pain.    Tobacco use - understands need to quit   Right breast mass on CT - malignancy vs infection - will need mammogram - Has had R breast biopsy in 2022 which was benign - CT this admit 3.6 x 3.4 cm possible malignancy or infection/abscess.   Reached out to SW to assist with family transportation to clinic. Order placed for [facility]. HFSW working on this.    I reviewed the LVAD parameters from today, and  compared the results to the patient's prior recorded data.  No programming changes were made.  The LVAD is functioning within specified parameters.  The patient performs LVAD self-test daily.  LVAD interrogation was negative for any significant power changes, alarms or PI events/speed drops.  LVAD equipment check completed and is in good working order.  Back-up equipment present.   LVAD education done on emergency procedures and precautions and reviewed exit site care.  Length of Stay: 14  Beckey LITTIE Coe, NP 02/20/2024, 9:58 AM  VAD Team --- VAD ISSUES ONLY--- Pager [phone] (7am - 7am)  Advanced Heart Failure Team  Pager [phone] (M-F; 7a - 5p)   Please visit [email or web address]: For overnight coverage please call cardiology fellow first. If fellow not available call Shock/ECMO MD on call.  For ECMO / Mechanical  Support (Impella, IABP, LVAD) issues call Shock / ECMO MD on call.    Patient seen with NP, I formulated the plan and agree with the above note.    She remains on ceftriaxone /linezolid  for driveline infection (Proteus, corynebacterium on cultures), now s/p debridement.   Today, increased drainage noted with dressing change.    LDH 200.    INR 1.6, on heparin  gtt + warfarin.    General: Well appearing this am. NAD.  HEENT: Normal. Neck: Supple, JVP 7-8 cm. Carotids OK.  Cardiac:  Mechanical heart sounds with LVAD hum present.  Lungs:  CTAB, normal effort.  Abdomen:  NT, ND, no HSM. No bruits or masses. +BS  LVAD exit site: Well-healed and incorporated. Dressing dry and intact. No erythema or drainage. Stabilization device present and accurately applied. Driveline dressing changed daily per sterile technique. Extremities:  Warm and dry. No cyanosis, clubbing, rash, or edema.  Neuro:  Alert & oriented x 3. Cranial nerves grossly intact. Moves all 4 extremities w/o difficulty. Affect pleasant     Continue ceftriaxone  to 3/3 and linezolid  to 2/5 for driveline infection. Has PICC.    LVAD  parameters reviewed and stable.    On warfarin + heparin  gtt with INR 1.6, LDH back to normal range. Can stop heparin  gtt.    Has right breast mass, this seems to be chronic with negative biopsy in 2022.  Will need workup with mammogram as outpatient.    With increased drainage, plan to keep here over weekend with daily dressing changes and will hopefully get home Monday.    Ezra Shuck 02/20/2024 2:01 PM  "

## 2024-02-20 NOTE — Consult Note (Addendum)
" ° °  CLINICAL SUPPORT TEAM - WOUND OSTOMY AND CONTINENCE TEAM  CONSULTATION SERVICES   WOC Nurse-Inpatient Note   WOC Nurse wound follow up Refer to previous WOC consult note.  LVAD team at the bedside to perform dressing change.  Previous Aquacel was very saturated with mod amt tan drainage.  Discussed plan of care with team; since next dressing change will be performed by them on Mon, order changed to apply 2 strips of Aquacel to increase absorption.    Topical treatment orders provided as follows:  Each time LVAD drive line dressing is changed, apply dressing to left wound site as follows: Cut 2 pieces of Aquacel (#[id number]) into a spiral shape like a cinnamon roll, then tuck the strips into the wound, using a swab to fill and leave tail sticking out of the skin.  Cover with the LVAD outer dressing kit.  Please re-consult if further assistance is needed.  Thank-you,  Stephane Fought MSN, RN, CWOCN, CWCN-AP, CNS Contact Mon-Fri 0700-1500: [phone]    "

## 2024-02-20 NOTE — Progress Notes (Signed)
 LVAD Coordinator Rounding Note:  Admitted 02/06/24 from VAD clinic for drive line infection to advanced heart failure service  HM III LVAD implanted on 07/24/23 by Dr. Lucas under destination criteria.  Pt laying in bed on my arrival. Denies complaints. Dressing changed today at bedside. See below for documentation.  PRN pain medication administered prior to wound packing.   ID following. Plan per ID IV Ceftriaxone  once daily for 6 weeks (End date: 03/23/24), followed by suppression. Will do 2 weeks PO Linezolid  (End date: 02/26/24) to cover the Corynebacterium from the culture obtained off the velour. Holley Herring provided home IV antibiotic teaching yesterday with patient at bedside.   Discussed importance of compliance with wound care with pt at bedside today. Wound care nurse assisted with dressing change today.  Vital signs: Temp: 98.6 HR: 65 Doppler Pressure: 84 Automatic BP: 97/84 (90) O2 Sat: 98% on RA Wt: 251.7>250.6>247.8>252.4>254.8>251.1>252.5>253.8>255.9>258.1  lbs  LVAD interrogation reveals:  Speed: 6000 Flow: 4.5 Power: 4.8 w PI: 2.3  Alarms: none Events: 1 today; 6 yesterday Hematocrit: 33  Fixed speed: 6000 Low speed limit: 5700  Drive Line: Pt premedicated by bedside RN prior to wound care. Existing VAD dressing/packing removed and site care performed using sterile technique. Drive line exit site/incision cleaned with CHG swab x 2, allowed to dry. Packed incision with 2 large Aquacel strip. Covered Silverlon patch, with several dry 4 x 4. Red rubber with suture intact directly at exit site. Incisional sutures intact- skin well approximated. Exit site partially incorporated. Velour removed in OR, driveline significantly exposed at exit site. Large amount of slimy bserosanguinous drainage on previous dressing. No foul odor or rash noted. Anchor reapplied. Will reassess packing on Monday. Next dressing change due 02/23/23. If there is drainage that leaks out from bandage  please just reinforce.      Labs:  LDH trend: 179>196>181>164>343>160>180  INR trend: 1.2>1.1>1.1>1.0>1.0>1.1>1.2>1.4>1.6  Anticoagulation Plan: - INR Goal: 2.0-3.0 - ASA Dose: n/a - Coumadin  dosing per pharmacy  Arrythmias:  HX: NSVT on p.o. amiodarone   Infection: 01/04/25>> wound cx >> few eikenella corrodens 02/06/24>> blood cultures >> no growth 3 days 02/10/24>>OR wound cx>>rare proteus mirabilis 02/10/24>>OR fungal cx>>no fungus observed  02/10/24>> OR wound cx #2>> rare proteus mirabilis 02/10/24>> OR fungal cx #2>>no fungus observed  02/10/24>> OR drive line velour>> few proteus mirabilis, few diptheroids (corynebacterium) 02/10/24>> OR drive line velour fungal cx>> pending  Drips:  Heparin  700 units/hr  Adverse Events on VAD:  Plan/Recommendations:  1. Page VAD coordinator with any VAD alarms or equipment issues. 2. MWF drive line dressing changes per bedside RN or VAD coordinator  Lauraine Ip RN, BSN VAD Coordinator 24/7 Pager [phone]

## 2024-02-20 NOTE — Progress Notes (Signed)
 PHARMACY - ANTICOAGULATION CONSULT NOTE  Pharmacy Consult for heparin  + resume warfarin Indication: LVAD HM3 with hx PE/DVT  Allergies[1]  Patient Measurements: Height: 5' 10 (177.8 cm) Weight: 117.1 kg (258 lb 2.5 oz) IBW/kg (Calculated) : 68.5 HEPARIN  DW (KG): 94  Vital Signs: Temp: 99.3 F (37.4 C) (01/30 0727) Temp Source: Oral (01/30 0727) BP: 112/74 (01/30 0727) Pulse Rate: 79 (01/30 0727)  Labs: Recent Labs    02/18/24 0224 02/19/24 0409 02/19/24 1031 02/20/24 0231  HGB 12.5  --  12.1 10.8*  HCT 38.4  --  36.7 33.6*  PLT 123*  --  108* 97*  LABPROT 16.2* 17.8*  --  19.7*  INR 1.2 1.4*  --  1.6*  HEPARINUNFRC <0.10* <0.10*  --  <0.10*  CREATININE 1.27*  --   --  1.20*    Estimated Creatinine Clearance: 80.4 mL/min (A) (by C-G formula based on SCr of 1.2 mg/dL (H)).   Medical History: Past Medical History:  Diagnosis Date   Anemia    Blood transfusion without reported diagnosis    CAD (coronary artery disease)    Nonobstructive   Closed left ankle fracture    August 30 2012   Clotting disorder    DVT (deep venous thrombosis) (HCC)    L leg   Fibroid tumor    HFrEF (heart failure with reduced ejection fraction) (HCC)    Hidradenitis    Hypertension    LVAD (left ventricular assist device) present [facility])    Mitral regurgitation    Myocardial infarction (HCC)    NICM (nonischemic cardiomyopathy) (HCC)    NSVT (nonsustained ventricular tachycardia) (HCC)    Obesity    OSA on CPAP    Pulmonary embolism (HCC) 04/2013   Sleep apnea     Medications:  Scheduled:   atorvastatin   20 mg Oral q1800   Chlorhexidine  Gluconate Cloth  6 each Topical Daily   digoxin   0.125 mg Oral Daily   gabapentin   300 mg Oral BID   linezolid   600 mg Oral Q12H   pantoprazole   40 mg Oral Daily   sacubitril -valsartan   1 tablet Oral BID   spironolactone   25 mg Oral Daily   torsemide   20 mg Oral Daily   Warfarin - Pharmacist Dosing Inpatient   Does not apply q1600     Assessment: 79 yof presenting with concern for driveline infection - plan to admit for IV antibiotics and evaluation for possible I&D. On warfarin PTA for LVAD + hx DVT/PE.   PTA warfarin regimen is 5 mg daily except 7.5 mg Tues/Sat at last clinic appointment.  Heparin  level this morning is <0.1 as expected with low dose heparin  at 700 units/hr. INR up to 1.6 after boost doses of warfarin 10 mg. Hgb 10.8, plt down to 97. LDH last 200 yesterday. No s/sx of bleeding. Oral intake charted at 100% of meals.   Per discussion with AHF MD, plan to continue low dose heparin  infusion while INR remains <1.5  - given above today will discontinue order.   Goal of Therapy:  INR 2-3 Heparin  level <0.3 - fixed rate unless directed by MD Monitor platelets by anticoagulation protocol: Yes   Plan:  Discontinue fixed rate heparin   Warfarin 7.5 mg x 1 repeat today Monitor daily INR, HL, CBC, and for s/sx of bleeding  Thank you for allowing pharmacy to participate in this patient's care,  Suzen Sour, PharmD, BCCCP Clinical Pharmacist  Phone: [phone] 02/20/2024 10:00 AM  Please check AMION for all  [facility] Pharmacy phone numbers After 10:00 PM, call Main Pharmacy [phone]      [1]  Allergies Allergen Reactions   Sulfa  Antibiotics Itching, Swelling and Rash    Lip swelling

## 2024-02-21 LAB — CBC
HCT: 31.8 % — ABNORMAL LOW (ref 36.0–46.0)
Hemoglobin: 10.4 g/dL — ABNORMAL LOW (ref 12.0–15.0)
MCH: 29.9 pg (ref 26.0–34.0)
MCHC: 32.7 g/dL (ref 30.0–36.0)
MCV: 91.4 fL (ref 80.0–100.0)
Platelets: 87 10*3/uL — ABNORMAL LOW (ref 150–400)
RBC: 3.48 MIL/uL — ABNORMAL LOW (ref 3.87–5.11)
RDW: 15.7 % — ABNORMAL HIGH (ref 11.5–15.5)
WBC: 3.1 10*3/uL — ABNORMAL LOW (ref 4.0–10.5)
nRBC: 0 % (ref 0.0–0.2)

## 2024-02-21 LAB — PROTIME-INR
INR: 1.8 — ABNORMAL HIGH (ref 0.8–1.2)
Prothrombin Time: 21.9 s — ABNORMAL HIGH (ref 11.4–15.2)

## 2024-02-21 LAB — LACTATE DEHYDROGENASE: LDH: 175 U/L (ref 105–235)

## 2024-02-21 MED ORDER — WARFARIN SODIUM 7.5 MG PO TABS
7.5000 mg | ORAL_TABLET | Freq: Once | ORAL | Status: AC
  Administered 2024-02-21: 7.5 mg via ORAL
  Filled 2024-02-21: qty 1

## 2024-02-21 NOTE — Progress Notes (Signed)
 PHARMACY - ANTICOAGULATION CONSULT NOTE  Pharmacy Consult for heparin  + resume warfarin Indication: LVAD HM3 with hx PE/DVT  Allergies[1]  Patient Measurements: Height: 5' 10 (177.8 cm) Weight: 117.6 kg (259 lb 4.2 oz) IBW/kg (Calculated) : 68.5 HEPARIN  DW (KG): 94  Vital Signs: Temp: 98.5 F (36.9 C) (01/31 1100) Temp Source: Oral (01/31 1100) BP: 83/61 (01/31 1100) Pulse Rate: 70 (01/31 1100)  Labs: Recent Labs    02/19/24 0409 02/19/24 1031 02/19/24 1031 02/20/24 0231 02/21/24 0500  HGB  --  12.1   < > 10.8* 10.4*  HCT  --  36.7  --  33.6* 31.8*  PLT  --  108*  --  97* 87*  LABPROT 17.8*  --   --  19.7* 21.9*  INR 1.4*  --   --  1.6* 1.8*  HEPARINUNFRC <0.10*  --   --  <0.10*  --   CREATININE  --   --   --  1.20*  --    < > = values in this interval not displayed.    Estimated Creatinine Clearance: 80.6 mL/min (A) (by C-G formula based on SCr of 1.2 mg/dL (H)).   Medical History: Past Medical History:  Diagnosis Date   Anemia    Blood transfusion without reported diagnosis    CAD (coronary artery disease)    Nonobstructive   Closed left ankle fracture    August 30 2012   Clotting disorder    DVT (deep venous thrombosis) (HCC)    L leg   Fibroid tumor    HFrEF (heart failure with reduced ejection fraction) (HCC)    Hidradenitis    Hypertension    LVAD (left ventricular assist device) present [facility])    Mitral regurgitation    Myocardial infarction (HCC)    NICM (nonischemic cardiomyopathy) (HCC)    NSVT (nonsustained ventricular tachycardia) (HCC)    Obesity    OSA on CPAP    Pulmonary embolism (HCC) 04/2013   Sleep apnea       Assessment: 34 yof presenting with concern for driveline infection - plan to admit for IV antibiotics and evaluation for possible I&D. On warfarin PTA for LVAD + hx DVT/PE.   PTA warfarin regimen is 5 mg daily except 7.5 mg Tues/Sat at last clinic appointment.  LDH down. INR 1.8 up trending on avg 8.5mg /d.  Eating  100%. Plt down trending.  Goal of Therapy:  INR 2- 3  Monitor platelets by anticoagulation protocol: Yes   Plan:   Warfarin 7.5 mg x 1  Monitor daily INR, watch platelets, and s/sx of bleeding  Thank you for allowing pharmacy to participate in this patient's care,  Jinnie Door, PharmD, BCPS, BCCP Clinical Pharmacist  Please check AMION for all [facility] Pharmacy phone numbers After 10:00 PM, call Main Pharmacy [phone]     [1]  Allergies Allergen Reactions   Sulfa  Antibiotics Itching, Swelling and Rash    Lip swelling

## 2024-02-21 NOTE — Progress Notes (Signed)
 [name] [name] ID: [name] [name], female   DOB: Mar 09, 1975, 49 y.o.   MRN: [id number]   Advanced Heart Failure VAD Team Note  PCP-Cardiologist: None  Chief Complaint: LVAD infection  [name] [name] Profile   [name] [name] is a 49 y.o. female with hx of saddle PE/DVT s/p IVC filter (2015), HFrEF/NICM, pancytopenia, nonobstructive CAD, morbid obesity, noncompliance, pancytopenia (followed by hematology), tobacco use. Admitted with LVAD DL infection  Significant events:    Admitted 1/16 for DL infection 8/79/73: S/p OR for DL debridement  8/77 : ID added linezolid . Continued on ancef .   Subjective:    On ceftriaxone  + Linezolid . Wound cx with rare proteus mirabilis+ few corynebacterium.    Remains on warfarin. INR 1.8  HeartMate 3 VAD Equipment Check Pump Speed (RPM): 6050 RPM Pump Flow (LPM): 4.5 Power (Watts): 5 Watts Pulsatility Index: 3.6 Fixed Speed Limit: 6000 rpm Low Speed Limit: 5700 rpm Alarms: No alarms Auscultated: Normal expected humming Power Module Self-Test (Daily): Done System Controller Self-Test: Passed [name] [name] Battery Source: [facility] / Wall unit Emergency Equipment at Bedside: Yes  Feels fine this morning, no complaints.   Objective:   Vital Signs:   Temp:  [97.7 F (36.5 C)-98.9 F (37.2 C)] 98.5 F (36.9 C) (01/31 1100) Pulse Rate:  [64-74] 70 (01/31 1100) Resp:  [17-18] 17 (01/31 1100) BP: (83-103)/(51-84) 83/61 (01/31 1100) SpO2:  [96 %-100 %] 96 % (01/31 1100) Weight:  [117.6 kg] 117.6 kg (01/31 0552) Last BM Date : 02/19/24  Intake/Output:   Intake/Output Summary (Last 24 hours) at 02/21/2024 1157 Last data filed at 02/21/2024 0817 Gross per 24 hour  Intake 840 ml  Output 1 ml  Net 839 ml  MAP 70s  Physical Exam  General: Well appearing this am. NAD.  HEENT: Normal. Neck: Supple, JVP 7-8 cm. Carotids OK.  Cardiac:  Mechanical heart sounds with LVAD hum present.  Lungs:  CTAB, normal effort.  Abdomen:  NT, ND, no HSM. No bruits or masses. +BS   LVAD exit site: Site is dressed Extremities:  Warm and dry. No cyanosis, clubbing, rash, or edema.  Neuro:  Alert & oriented x 3. Cranial nerves grossly intact. Moves all 4 extremities w/o difficulty. Affect pleasant    Tele: SR 60-70s (Personally reviewed)    Labs  Basic Metabolic Panel: Recent Labs  Lab 02/15/24 0125 02/16/24 0051 02/17/24 0301 02/18/24 0224 02/20/24 0231  NA 140 136 138 135 137  K 4.8 5.2* 4.4 4.8 4.9  CL 100 99 100 100 102  CO2 33* 25 28 26 26   GLUCOSE 87 147* 88 83 83  BUN 43* 38* 30* 28* 29*  CREATININE 1.53* 1.45* 1.17* 1.27* 1.20*  CALCIUM  9.4 9.1 9.3 9.6 8.9  MG  --  2.2  --   --   --     Liver Function Tests: No results for input(s): AST, ALT, ALKPHOS, BILITOT, PROT, ALBUMIN  in the last 168 hours. No results for input(s): LIPASE, AMYLASE in the last 168 hours. No results for input(s): AMMONIA in the last 168 hours.  CBC: Recent Labs  Lab 02/17/24 0301 02/18/24 0224 02/19/24 1031 02/20/24 0231 02/21/24 0500  WBC 3.7* 3.1* 3.2* 2.7* 3.1*  HGB 12.3 12.5 12.1 10.8* 10.4*  HCT 37.9 38.4 36.7 33.6* 31.8*  MCV 93.3 92.3 91.5 93.9 91.4  PLT 145* 123* 108* 97* 87*    INR: Recent Labs  Lab 02/17/24 0301 02/18/24 0224 02/19/24 0409 02/20/24 0231 02/21/24 0500  INR 1.1 1.2 1.4* 1.6* 1.8*  Medications:    Scheduled Medications:  atorvastatin   20 mg Oral q1800   Chlorhexidine  Gluconate Cloth  6 each Topical Daily   digoxin   0.125 mg Oral Daily   gabapentin   300 mg Oral BID   linezolid   600 mg Oral Q12H   pantoprazole   40 mg Oral Daily   sacubitril -valsartan   1 tablet Oral BID   spironolactone   25 mg Oral Daily   torsemide   20 mg Oral Daily   warfarin  7.5 mg Oral ONCE-1600   Warfarin - Pharmacist Dosing Inpatient   Does not apply q1600    Infusions:  cefTRIAXone  (ROCEPHIN )  IV 2 g (02/20/24 1325)    PRN Medications: acetaminophen , albuterol , HYDROmorphone  (DILAUDID ) injection, lip balm, morphine   injection, ondansetron  (ZOFRAN ) IV, oxyCODONE -acetaminophen , sodium chloride  flush, traMADol   Assessment/Plan:    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  Related to poor outpatient dressing care.   - CT abdomen pelvis with contrast - unremarkable except for R breast mass (see below) - Blood Cx- NGTD  - S/P post I&D with Dr. Daniel 1/20. Wound Cx with few proteus mirabilis, few corynebacterium  - Continue ceftriaxone  for 6 weeks (end date 03/23/24) followed by suppression and linezolid  (end date 02/26/24).  - PICC placed.  Home Health ordered.   Chronic systolic HF with biventricular dysfunction/stage D cardiomyopathy s/p HM3 07/24/2023 with concurrent MVR - Continue speed at 6000 RPM, stable - NYHA II. Appears euvolemic.  - MAPs stable.  - Continue digoxin  0.125 mg daily, last dig level <0.6 - Continue entresto   - Continue spironolactone  25 mg daily - INR 1.8, on warfarin - LDH 164>343>160>180> 172    Severe MR s/p MVR - Echo 4/25 with severe MR  - TEER on 07/09/23 complicated by worsening MR - S/p MVR   Hx of PE/DVT w/ IVC filter - Evaluated at [facility] where thrombophilia work-up was negative (Lupus inhibitor, anticardiolipin antibodies, and anti beta-2  glycoprotein antibodies were negative; protein C activity 85%, protein S activity 76%, factor V Leiden, and prothrombin gene mutation ) - IVC filter in place; could not be removed due to significant thrombus burden on filter at [facility].  - On warfarin. INR 1.8.     Obesity  - BMI significantly improved, will need GLP-1 later if considering txp - Body mass index is 37.2 kg/m.   CAD - LHC in 5/23 w/ nonobstructive CAD involving RCA - Inferior WMA on most recent echo - No chest pain.    Tobacco use - understands need to quit   Right breast mass on CT - malignancy vs infection - will need mammogram - Has had R breast biopsy in 2022 which was benign - CT this admit 3.6 x 3.4 cm possible  malignancy or infection/abscess.   Plan to reassess debridement site Monday and then hopefully home.    I reviewed the LVAD parameters from today, and compared the results to the [name] [name]'s prior recorded data.  No programming changes were made.  The LVAD is functioning within specified parameters.  The [name] [name] performs LVAD self-test daily.  LVAD interrogation was negative for any significant power changes, alarms or PI events/speed drops.  LVAD equipment check completed and is in good working order.  Back-up equipment present.   LVAD education done on emergency procedures and precautions and reviewed exit site care.  Length of Stay: 15  Ezra Shuck, MD 02/21/2024, 11:57 AM  VAD Team --- VAD ISSUES ONLY--- Pager [phone] (7am - 7am)  Advanced Heart Failure Team  Pager [phone] (M-F; 7a - 5p)   Please visit [email or web address]: For overnight coverage please call cardiology fellow first. If fellow not available call Shock/ECMO MD on call.  For ECMO / Mechanical Support (Impella, IABP, LVAD) issues call Shock / ECMO MD on call.

## 2024-02-22 LAB — BASIC METABOLIC PANEL WITH GFR
Anion gap: 11 (ref 5–15)
BUN: 23 mg/dL — ABNORMAL HIGH (ref 6–20)
CO2: 27 mmol/L (ref 22–32)
Calcium: 9.5 mg/dL (ref 8.9–10.3)
Chloride: 99 mmol/L (ref 98–111)
Creatinine, Ser: 1.2 mg/dL — ABNORMAL HIGH (ref 0.44–1.00)
GFR, Estimated: 56 mL/min — ABNORMAL LOW
Glucose, Bld: 86 mg/dL (ref 70–99)
Potassium: 4.3 mmol/L (ref 3.5–5.1)
Sodium: 136 mmol/L (ref 135–145)

## 2024-02-22 LAB — CBC
HCT: 35.8 % — ABNORMAL LOW (ref 36.0–46.0)
Hemoglobin: 12 g/dL (ref 12.0–15.0)
MCH: 30.6 pg (ref 26.0–34.0)
MCHC: 33.5 g/dL (ref 30.0–36.0)
MCV: 91.3 fL (ref 80.0–100.0)
Platelets: 93 10*3/uL — ABNORMAL LOW (ref 150–400)
RBC: 3.92 MIL/uL (ref 3.87–5.11)
RDW: 15.7 % — ABNORMAL HIGH (ref 11.5–15.5)
WBC: 2.8 10*3/uL — ABNORMAL LOW (ref 4.0–10.5)
nRBC: 0 % (ref 0.0–0.2)

## 2024-02-22 LAB — PROTIME-INR
INR: 1.8 — ABNORMAL HIGH (ref 0.8–1.2)
Prothrombin Time: 22.2 s — ABNORMAL HIGH (ref 11.4–15.2)

## 2024-02-22 LAB — LACTATE DEHYDROGENASE: LDH: 194 U/L (ref 105–235)

## 2024-02-22 MED ORDER — WARFARIN SODIUM 5 MG PO TABS
10.0000 mg | ORAL_TABLET | Freq: Once | ORAL | Status: AC
  Administered 2024-02-22: 10 mg via ORAL
  Filled 2024-02-22: qty 2

## 2024-02-22 MED ORDER — SODIUM CHLORIDE 0.9 % IV BOLUS
250.0000 mL | Freq: Once | INTRAVENOUS | Status: AC
  Administered 2024-02-22: 250 mL via INTRAVENOUS

## 2024-02-22 NOTE — Plan of Care (Signed)
  Problem: Education: Goal: Patient will understand all VAD equipment and how it functions Outcome: Progressing   Problem: Cardiac: Goal: LVAD will function as expected and patient will experience no clinical alarms Outcome: Progressing   Problem: Education: Goal: Knowledge of General Education information will improve Description: Including pain rating scale, medication(s)/side effects and non-pharmacologic comfort measures Outcome: Progressing   Problem: Health Behavior/Discharge Planning: Goal: Ability to manage health-related needs will improve Outcome: Progressing

## 2024-02-22 NOTE — Progress Notes (Signed)
 PHARMACY - ANTICOAGULATION CONSULT NOTE  Pharmacy Consult for heparin  + resume warfarin Indication: LVAD HM3 with hx PE/DVT  Allergies[1]  Patient Measurements: Height: 5' 10 (177.8 cm) Weight: 116.3 kg (256 lb 6.3 oz) IBW/kg (Calculated) : 68.5 HEPARIN  DW (KG): 94  Vital Signs: Temp: 97.7 F (36.5 C) (02/01 0731) Temp Source: Oral (02/01 0731) BP: 84/64 (02/01 0731) Pulse Rate: 63 (02/01 0731)  Labs: Recent Labs    02/20/24 0231 02/21/24 0500 02/22/24 1049  HGB 10.8* 10.4* 12.0  HCT 33.6* 31.8* 35.8*  PLT 97* 87* 93*  LABPROT 19.7* 21.9* 22.2*  INR 1.6* 1.8* 1.8*  HEPARINUNFRC <0.10*  --   --   CREATININE 1.20*  --  1.20*    Estimated Creatinine Clearance: 80.1 mL/min (A) (by C-G formula based on SCr of 1.2 mg/dL (H)).   Medical History: Past Medical History:  Diagnosis Date   Anemia    Blood transfusion without reported diagnosis    CAD (coronary artery disease)    Nonobstructive   Closed left ankle fracture    August 30 2012   Clotting disorder    DVT (deep venous thrombosis) (HCC)    L leg   Fibroid tumor    HFrEF (heart failure with reduced ejection fraction) (HCC)    Hidradenitis    Hypertension    LVAD (left ventricular assist device) present [facility])    Mitral regurgitation    Myocardial infarction (HCC)    NICM (nonischemic cardiomyopathy) (HCC)    NSVT (nonsustained ventricular tachycardia) (HCC)    Obesity    OSA on CPAP    Pulmonary embolism (HCC) 04/2013   Sleep apnea       Assessment: 39 yof presenting with concern for driveline infection - plan to admit for IV antibiotics and evaluation for possible I&D. On warfarin PTA for LVAD + hx DVT/PE.   PTA warfarin regimen is 5 mg daily except 7.5 mg Tues/Sat at last clinic appointment.  LDH stable. INR 1.8 unchanged despite 7.5mg . Eating 75 -100%. Plt stable.  Goal of Therapy:  INR 2- 3  Monitor platelets by anticoagulation protocol: Yes   Plan:   Warfarin 10 mg x 1  Monitor daily  INR, watch platelets, and s/sx of bleeding  Thank you for allowing pharmacy to participate in this patient's care,  Jinnie Door, PharmD, BCPS, BCCP Clinical Pharmacist  Please check AMION for all [facility] Pharmacy phone numbers After 10:00 PM, call Main Pharmacy [phone]      [1]  Allergies Allergen Reactions   Sulfa  Antibiotics Itching, Swelling and Rash    Lip swelling

## 2024-02-22 NOTE — Progress Notes (Signed)
 Patient ID: [name] [name], female   DOB: [birth year], 49 y.o.   MRN: [id number]   Advanced Heart Failure VAD Team Note  PCP-Cardiologist: None  Chief Complaint: LVAD infection  Patient Profile   [name] [name] is a 49 y.o. female with hx of saddle PE/DVT s/p IVC filter (2015), HFrEF/NICM, pancytopenia, nonobstructive CAD, morbid obesity, noncompliance, pancytopenia (followed by hematology), tobacco use. Admitted with LVAD DL infection  Significant events:    Admitted 1/16 for DL infection 8/79/73: S/p OR for DL debridement  8/77 : ID added linezolid . Continued on ancef .   Subjective:    On ceftriaxone  + Linezolid . Wound cx with rare proteus mirabilis+ few corynebacterium.    Remains on warfarin. INR 1.8.   Multiple PI events over the last day, PI got down to 1.6 after her walk today and she was lightheaded.  MAP is staying low 70s.   HeartMate 3 VAD Equipment Check Pump Speed (RPM): 6050 RPM Pump Flow (LPM): 4.8 Power (Watts): 5 Watts Pulsatility Index: 1.7 Fixed Speed Limit: 6000 rpm Low Speed Limit: 5700 rpm Alarms: No alarms Auscultated: Normal expected humming Power Module Self-Test (Daily): Done System Controller Self-Test: Passed Patient Battery Source: [facility] / Wall unit Emergency Equipment at Bedside: Yes Multiple PI events.    Objective:   Vital Signs:   Temp:  [97.7 F (36.5 C)-98.6 F (37 C)] 97.7 F (36.5 C) (02/01 0731) Pulse Rate:  [63-74] 63 (02/01 0731) Resp:  [16-17] 16 (02/01 0731) BP: (81-94)/(45-66) 84/64 (02/01 0731) SpO2:  [96 %-99 %] 98 % (02/01 0731) Weight:  [116.3 kg] 116.3 kg (02/01 0400) Last BM Date : 02/20/24  Intake/Output:   Intake/Output Summary (Last 24 hours) at 02/22/2024 1233 Last data filed at 02/22/2024 0823 Gross per 24 hour  Intake 840 ml  Output 3150 ml  Net -2310 ml  MAP 70  Physical Exam  General: Well appearing this am. NAD.  HEENT: Normal. Neck: Supple, JVP 7-8 cm. Carotids OK.  Cardiac:  Mechanical heart sounds  with LVAD hum present.  Lungs:  CTAB, normal effort.  Abdomen:  NT, ND, no HSM. No bruits or masses. +BS  LVAD exit site: Driveline dressed.  Extremities:  Warm and dry. No cyanosis, clubbing, rash, or edema.  Neuro:  Alert & oriented x 3. Cranial nerves grossly intact. Moves all 4 extremities w/o difficulty. Affect pleasant    Tele: SR 60-70s (Personally reviewed)    Labs  Basic Metabolic Panel: Recent Labs  Lab 02/16/24 0051 02/17/24 0301 02/18/24 0224 02/20/24 0231 02/22/24 1049  NA 136 138 135 137 136  K 5.2* 4.4 4.8 4.9 4.3  CL 99 100 100 102 99  CO2 25 28 26 26 27   GLUCOSE 147* 88 83 83 86  BUN 38* 30* 28* 29* 23*  CREATININE 1.45* 1.17* 1.27* 1.20* 1.20*  CALCIUM  9.1 9.3 9.6 8.9 9.5  MG 2.2  --   --   --   --     Liver Function Tests: No results for input(s): AST, ALT, ALKPHOS, BILITOT, PROT, ALBUMIN  in the last 168 hours. No results for input(s): LIPASE, AMYLASE in the last 168 hours. No results for input(s): AMMONIA in the last 168 hours.  CBC: Recent Labs  Lab 02/18/24 0224 02/19/24 1031 02/20/24 0231 02/21/24 0500 02/22/24 1049  WBC 3.1* 3.2* 2.7* 3.1* 2.8*  HGB 12.5 12.1 10.8* 10.4* 12.0  HCT 38.4 36.7 33.6* 31.8* 35.8*  MCV 92.3 91.5 93.9 91.4 91.3  PLT 123* 108* 97* 87* 93*  INR: Recent Labs  Lab 02/18/24 0224 02/19/24 0409 02/20/24 0231 02/21/24 0500 02/22/24 1049  INR 1.2 1.4* 1.6* 1.8* 1.8*    Medications:    Scheduled Medications:  atorvastatin   20 mg Oral q1800   Chlorhexidine  Gluconate Cloth  6 each Topical Daily   digoxin   0.125 mg Oral Daily   gabapentin   300 mg Oral BID   linezolid   600 mg Oral Q12H   pantoprazole   40 mg Oral Daily   spironolactone   25 mg Oral Daily   warfarin  10 mg Oral ONCE-1600   Warfarin - Pharmacist Dosing Inpatient   Does not apply q1600    Infusions:  cefTRIAXone  (ROCEPHIN )  IV 2 g (02/21/24 1319)    PRN Medications: acetaminophen , albuterol , HYDROmorphone  (DILAUDID )  injection, lip balm, morphine  injection, ondansetron  (ZOFRAN ) IV, oxyCODONE -acetaminophen , sodium chloride  flush, traMADol   Assessment/Plan:    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  Related to poor outpatient dressing care.   - CT abdomen pelvis with contrast - unremarkable except for R breast mass (see below) - Blood Cx- NGTD  - S/P post I&D with Dr. Daniel 1/20. Wound Cx with few proteus mirabilis, few corynebacterium  - Continue ceftriaxone  for 6 weeks (end date 03/23/24) followed by suppression and linezolid  (end date 02/26/24).  - PICC placed.  Home Health ordered.   Chronic systolic HF with biventricular dysfunction/stage D cardiomyopathy s/p HM3 07/24/2023 with concurrent MVR - Continue speed at 6000 RPM, stable - NYHA II. - MAP around 70 today with low PI after her walk and multiple PI events.  I suspect she is hypovolemic.  I will stop Entresto  and torsemide  for now. Encourage po hydration.   - Continue digoxin  0.125 mg daily, last dig level <0.6 - Continue spironolactone  25 mg daily for now - INR 1.8, on warfarin - LDH 194   Severe MR s/p MVR - Echo 4/25 with severe MR  - TEER on 07/09/23 complicated by worsening MR - S/p MVR   Hx of PE/DVT w/ IVC filter - Evaluated at [facility] where thrombophilia work-up was negative (Lupus inhibitor, anticardiolipin antibodies, and anti beta-2  glycoprotein antibodies were negative; protein C activity 85%, protein S activity 76%, factor V Leiden, and prothrombin gene mutation ) - IVC filter in place; could not be removed due to significant thrombus burden on filter at [facility].  - On warfarin. INR 1.8.     Obesity  - BMI significantly improved, will need GLP-1 later if considering txp - Body mass index is 36.79 kg/m.   CAD - LHC in 5/23 w/ nonobstructive CAD involving RCA - Inferior WMA on most recent echo - No chest pain.    Tobacco use - understands need to quit   Right breast mass on CT -  malignancy vs infection - will need mammogram - Has had R breast biopsy in 2022 which was benign - CT this admit 3.6 x 3.4 cm possible malignancy or infection/abscess.   Plan to reassess debridement site Monday and then hopefully home.    I reviewed the LVAD parameters from today, and compared the results to the patient's prior recorded data.  No programming changes were made.  The LVAD is functioning within specified parameters.  The patient performs LVAD self-test daily.  LVAD interrogation was negative for any significant power changes, alarms or PI events/speed drops.  LVAD equipment check completed and is in good working order.  Back-up equipment present.   LVAD education done on emergency procedures and precautions and  reviewed exit site care.  Length of Stay: 4  Ezra Shuck, MD 02/22/2024, 12:33 PM  VAD Team --- VAD ISSUES ONLY--- Pager [phone] (7am - 7am)  Advanced Heart Failure Team  Pager [phone] (M-F; 7a - 5p)   Please visit [email or web address]: For overnight coverage please call cardiology fellow first. If fellow not available call Shock/ECMO MD on call.  For ECMO / Mechanical Support (Impella, IABP, LVAD) issues call Shock / ECMO MD on call.

## 2024-02-23 ENCOUNTER — Other Ambulatory Visit (HOSPITAL_COMMUNITY): Payer: Self-pay

## 2024-02-23 LAB — PROTIME-INR
INR: 2 — ABNORMAL HIGH (ref 0.8–1.2)
Prothrombin Time: 23.8 s — ABNORMAL HIGH (ref 11.4–15.2)

## 2024-02-23 LAB — BASIC METABOLIC PANEL WITH GFR
Anion gap: 12 (ref 5–15)
BUN: 23 mg/dL — ABNORMAL HIGH (ref 6–20)
CO2: 25 mmol/L (ref 22–32)
Calcium: 9 mg/dL (ref 8.9–10.3)
Chloride: 100 mmol/L (ref 98–111)
Creatinine, Ser: 1.07 mg/dL — ABNORMAL HIGH (ref 0.44–1.00)
GFR, Estimated: >60 mL/min
Glucose, Bld: 81 mg/dL (ref 70–99)
Potassium: 4.4 mmol/L (ref 3.5–5.1)
Sodium: 136 mmol/L (ref 135–145)

## 2024-02-23 LAB — CBC
HCT: 33.8 % — ABNORMAL LOW (ref 36.0–46.0)
Hemoglobin: 11.2 g/dL — ABNORMAL LOW (ref 12.0–15.0)
MCH: 30 pg (ref 26.0–34.0)
MCHC: 33.1 g/dL (ref 30.0–36.0)
MCV: 90.6 fL (ref 80.0–100.0)
Platelets: 76 10*3/uL — ABNORMAL LOW (ref 150–400)
RBC: 3.73 MIL/uL — ABNORMAL LOW (ref 3.87–5.11)
RDW: 15.5 % (ref 11.5–15.5)
WBC: 3 10*3/uL — ABNORMAL LOW (ref 4.0–10.5)
nRBC: 0 % (ref 0.0–0.2)

## 2024-02-23 LAB — LACTATE DEHYDROGENASE: LDH: 159 U/L (ref 105–235)

## 2024-02-23 MED ORDER — WARFARIN SODIUM 7.5 MG PO TABS: 7.5000 mg | ORAL_TABLET | Freq: Once | ORAL | Status: DC

## 2024-02-23 MED ORDER — HEPARIN SOD (PORK) LOCK FLUSH 100 UNIT/ML IV SOLN
250.0000 [IU] | INTRAVENOUS | Status: AC | PRN
  Administered 2024-02-23: 250 [IU]

## 2024-02-23 MED ORDER — LINEZOLID 600 MG PO TABS
600.0000 mg | ORAL_TABLET | Freq: Two times a day (BID) | ORAL | 0 refills | Status: AC
  Filled 2024-02-23: qty 7, 4d supply, fill #0

## 2024-02-23 MED ORDER — TRAMADOL HCL 50 MG PO TABS
50.0000 mg | ORAL_TABLET | Freq: Four times a day (QID) | ORAL | 0 refills | Status: AC | PRN
  Filled 2024-02-23: qty 20, 5d supply, fill #0

## 2024-02-23 NOTE — Plan of Care (Signed)
" °  Problem: Education: Goal: Patient will understand all VAD equipment and how it functions Outcome: Progressing Goal: Patient will be able to verbalize current INR target range and antiplatelet therapy for discharge home Outcome: Progressing   Problem: Cardiac: Goal: LVAD will function as expected and patient will experience no clinical alarms Outcome: Progressing   Problem: Education: Goal: Knowledge of General Education information will improve Description: Including pain rating scale, medication(s)/side effects and non-pharmacologic comfort measures Outcome: Progressing   Problem: Health Behavior/Discharge Planning: Goal: Ability to manage health-related needs will improve Outcome: Progressing   Problem: Clinical Measurements: Goal: Ability to maintain clinical measurements within normal limits will improve Outcome: Progressing Goal: Will remain free from infection Outcome: Progressing Goal: Diagnostic test results will improve Outcome: Progressing Goal: Respiratory complications will improve Outcome: Progressing Goal: Cardiovascular complication will be avoided Outcome: Progressing   Problem: Activity: Goal: Risk for activity intolerance will decrease Outcome: Progressing   Problem: Nutrition: Goal: Adequate nutrition will be maintained Outcome: Progressing   Problem: Coping: Goal: Level of anxiety will decrease Outcome: Progressing   Problem: Elimination: Goal: Will not experience complications related to bowel motility Outcome: Progressing Goal: Will not experience complications related to urinary retention Outcome: Progressing   Problem: Pain Managment: Goal: General experience of comfort will improve and/or be controlled Outcome: Progressing   Problem: Safety: Goal: Ability to remain free from injury will improve Outcome: Progressing   Problem: Skin Integrity: Goal: Risk for impaired skin integrity will decrease Outcome: Progressing   Problem:  Bowel/Gastric: Goal: Gastrointestinal status for postoperative course will improve Outcome: Progressing   Problem: Cardiac: Goal: Ability to maintain an adequate cardiac output Outcome: Progressing Goal: Will show no evidence of cardiac arrhythmias Outcome: Progressing   Problem: Neurological: Goal: Will regain or maintain usual level of consciousness Outcome: Progressing   Problem: Clinical Measurements: Goal: Ability to maintain clinical measurements within normal limits Outcome: Progressing Goal: Postoperative complications will be avoided or minimized Outcome: Progressing   Problem: Respiratory: Goal: Will regain and/or maintain adequate ventilation Outcome: Progressing Goal: Respiratory status will improve Outcome: Progressing   Problem: Skin Integrity: Goal: Demonstrates signs of wound healing without infection Outcome: Progressing   "

## 2024-02-23 NOTE — Plan of Care (Signed)
" °  Problem: Education: Goal: Patient will understand all VAD equipment and how it functions Outcome: Progressing Goal: Patient will be able to verbalize current INR target range and antiplatelet therapy for discharge home Outcome: Progressing   Problem: Cardiac: Goal: LVAD will function as expected and patient will experience no clinical alarms Outcome: Progressing   Problem: Health Behavior/Discharge Planning: Goal: Ability to manage health-related needs will improve Outcome: Progressing   Problem: Clinical Measurements: Goal: Will remain free from infection Outcome: Progressing Goal: Diagnostic test results will improve Outcome: Progressing Goal: Respiratory complications will improve Outcome: Progressing Goal: Cardiovascular complication will be avoided Outcome: Progressing   Problem: Nutrition: Goal: Adequate nutrition will be maintained Outcome: Progressing   Problem: Coping: Goal: Level of anxiety will decrease Outcome: Progressing   Problem: Elimination: Goal: Will not experience complications related to bowel motility Outcome: Progressing Goal: Will not experience complications related to urinary retention Outcome: Progressing   Problem: Pain Managment: Goal: General experience of comfort will improve and/or be controlled Outcome: Progressing   Problem: Safety: Goal: Ability to remain free from injury will improve Outcome: Progressing   Problem: Education: Goal: Knowledge of the prescribed therapeutic regimen will improve Outcome: Progressing   "

## 2024-02-23 NOTE — Progress Notes (Signed)
 Patient provided with verbal discharge instructions. Paper copy of discharge provided to patient. RN answered all questions. VSS at discharge. Home with PICC line for home IV abx. TOC meds picked up enroute to d/c lounge  Patient belongings sent with patient. Patient dc'd via discharge lounge awaiting taxi

## 2024-02-23 NOTE — Progress Notes (Signed)
 PHARMACY - ANTICOAGULATION CONSULT NOTE  Pharmacy Consult for heparin  + resume warfarin Indication: LVAD HM3 with hx PE/DVT  Allergies[1]  Patient Measurements: Height: 5' 10 (177.8 cm) Weight: 115.4 kg (254 lb 8 oz) IBW/kg (Calculated) : 68.5 HEPARIN  DW (KG): 94  Vital Signs: Temp: 98 F (36.7 C) (02/02 0856) Temp Source: Oral (02/02 0856) BP: 89/63 (02/02 0856) Pulse Rate: 72 (02/02 0856)  Labs: Recent Labs    02/21/24 0500 02/22/24 1049 02/23/24 0448  HGB 10.4* 12.0 11.2*  HCT 31.8* 35.8* 33.8*  PLT 87* 93* 76*  LABPROT 21.9* 22.2* 23.8*  INR 1.8* 1.8* 2.0*  CREATININE  --  1.20* 1.07*    Estimated Creatinine Clearance: 89.6 mL/min (A) (by C-G formula based on SCr of 1.07 mg/dL (H)).   Medical History: Past Medical History:  Diagnosis Date   Anemia    Blood transfusion without reported diagnosis    CAD (coronary artery disease)    Nonobstructive   Closed left ankle fracture    August 30 2012   Clotting disorder    DVT (deep venous thrombosis) (HCC)    L leg   Fibroid tumor    HFrEF (heart failure with reduced ejection fraction) (HCC)    Hidradenitis    Hypertension    LVAD (left ventricular assist device) present [facility])    Mitral regurgitation    Myocardial infarction (HCC)    NICM (nonischemic cardiomyopathy) (HCC)    NSVT (nonsustained ventricular tachycardia) (HCC)    Obesity    OSA on CPAP    Pulmonary embolism (HCC) 04/2013   Sleep apnea       Assessment: 26 yof presenting with concern for driveline infection - plan to admit for IV antibiotics and evaluation for possible I&D. On warfarin PTA for LVAD + hx DVT/PE.   PTA warfarin regimen is 5 mg daily except 7.5 mg Tues/Sat at last clinic appointment.  INR today is therapeutic at 2. H/H and LDH normal.  Goal of Therapy:  INR 2- 3  Monitor platelets by anticoagulation protocol: Yes   Plan:   Warfarin 7.5mg  x1 tonight then resume regimen above  Ozell Jamaica, PharmD, BCPS,  [facility] Clinical Pharmacist [phone] Please check AMION for all [facility] Pharmacy numbers 02/23/2024        [1]  Allergies Allergen Reactions   Sulfa  Antibiotics Itching, Swelling and Rash    Lip swelling

## 2024-02-23 NOTE — Progress Notes (Signed)
 LVAD Coordinator Rounding Note:  Admitted 02/06/24 from VAD clinic for drive line infection to advanced heart failure service  HM III LVAD implanted on 07/24/23 by Dr. Lucas under destination criteria.  Pt laying in bed on my arrival. Denies complaints. Dressing changed today at bedside. See below for documentation.    ID following. Plan per ID IV Ceftriaxone  once daily for 6 weeks (End date: 03/23/24), followed by suppression. Will do 2 weeks PO Linezolid  (End date: 02/26/24) to cover the Corynebacterium from the culture obtained off the velour. Holley Herring provided home IV antibiotic teaching with patient at bedside. Pt will be giving her iv antibiotics daily.  Discussed importance of compliance with wound care with pt at bedside today. Gross amount of drainage today. Pt informed she will need to come to clinic MWF for dressing changes.  Vital signs: Temp: 98.6 HR: 65 Doppler: not documented Automatic BP: 121/91 (102) O2 Sat: 97% on RA Wt: 251.7>250.6>247.8>252.4>254.8>251.1>252.5>253.8>255.9>258.1>254.5  lbs  LVAD interrogation reveals:  Speed: 6000 Flow: 4.6 Power: 4.7 w PI: 2.7  Alarms: none Events: 6 today; 4 yesterday Hematocrit: 33  Fixed speed: 6000 Low speed limit: 5700  Drive Line: Existing VAD dressing/packing removed and site care performed using sterile technique. Drive line exit site/incision cleaned with CHG swab x 2, allowed to dry. Packed incision with 2 large Aquacel strip. Covered Silverlon patch, with several dry 4 x 4. Red rubber with suture intact directly at exit site. Incisional sutures intact- skin well approximated. Exit site partially incorporated. Velour removed in OR, driveline significantly exposed at exit site. Gross amount of slimy serosanguinous drainage on previous dressing. No foul odor or rash noted. Anchor reapplied. Next dressing change due 02/25/23.        Labs:  LDH trend: 179>196>181>164>343>160>180>159  INR trend:  1.2>1.1>1.1>1.0>1.0>1.1>1.2>1.4>1.6>2  Anticoagulation Plan: - INR Goal: 2.0-3.0 - ASA Dose: n/a - Coumadin  dosing per pharmacy  Arrythmias:  HX: NSVT on p.o. amiodarone   Infection: 01/04/25>> wound cx >> few eikenella corrodens 02/06/24>> blood cultures >> no growth 3 days 02/10/24>>OR wound cx>>rare proteus mirabilis 02/10/24>>OR fungal cx>>no fungus observed  02/10/24>> OR wound cx #2>> rare proteus mirabilis 02/10/24>> OR fungal cx #2>>no fungus observed  02/10/24>> OR drive line velour>> few proteus mirabilis, few diptheroids (corynebacterium) 02/10/24>> OR drive line velour fungal cx>> pending  Drips:  Heparin  700 units/hr-off  Adverse Events on VAD:  Plan/Recommendations:  1. Page VAD coordinator with any VAD alarms or equipment issues. 2. MWF drive line dressing changes per bedside RN or VAD coordinator 3. Pt ok to d/c home. She requires MWF dressing changes in clinic. We will recheck her INR on Friday this week.   Lauraine Ip RN, BSN VAD Coordinator 24/7 Pager [phone]

## 2024-02-25 ENCOUNTER — Inpatient Hospital Stay (HOSPITAL_COMMUNITY)

## 2024-02-25 ENCOUNTER — Ambulatory Visit (HOSPITAL_COMMUNITY)

## 2024-02-25 ENCOUNTER — Telehealth: Payer: Self-pay

## 2024-02-25 NOTE — Transitions of Care (Post Inpatient/ED Visit) (Signed)
" ° °  02/25/2024  Name: FAYLYNN STAMOS MRN: [id number] DOB: May 24, 1975  Today's TOC FU Call Status: Today's TOC FU Call Status:: Unsuccessful Call (1st Attempt) Unsuccessful Call (1st Attempt) Date: 02/25/24  Attempted to reach the patient regarding the most recent Inpatient/ED visit.  Follow Up Plan: Additional outreach attempts will be made to reach the patient to complete the Transitions of Care (Post Inpatient/ED visit) call.   Shona Prow RN, CCM   VBCI-Population Health RN Care Manager [phone]  "

## 2024-02-25 NOTE — Progress Notes (Incomplete)
 Pt presents to VAD Clinic for wound care today alone. Denies issues with drive line or equipment.  Recent admission for drive line infection. Cultures positive for Proteus Mirabilis and few Corynebacterium. Pt underwent drive line debridement with Dr. Daniel 02/10/24. ID recommendations for IV antibiotics for Ceftriaxone  (end date 3/3)and linezolid  (end date 2/5) per ID.   Pt reports no issues with administration of IV antibiotics. HHRN  Drive Line: Existing VAD dressing/packing removed and site care performed using sterile technique. Drive line exit site/incision cleaned with CHG swab x 2, allowed to dry. Packed incision with 2 large Aquacel strip. Covered Silverlon patch, with several dry 4 x 4. Red rubber with suture intact directly at exit site. Incisional sutures intact- skin well approximated. Exit site partially incorporated. Velour removed in OR, driveline significantly exposed at exit site. Gross amount of slimy serosanguinous drainage on previous dressing. No foul odor or rash noted. Anchor reapplied.    [name] Lunger RN, BSN VAD Coordinator 24/7 Pager [phone]

## 2024-02-26 ENCOUNTER — Telehealth: Payer: Self-pay

## 2024-02-26 ENCOUNTER — Telehealth (HOSPITAL_COMMUNITY): Payer: Self-pay

## 2024-02-26 NOTE — Transitions of Care (Post Inpatient/ED Visit) (Signed)
" ° °  02/26/2024  Name: [name] [name] MRN: [id number] DOB: 02-Nov-1975  Today's TOC FU Call Status: Today's TOC FU Call Status:: Unsuccessful Call (2nd Attempt) Unsuccessful Call (2nd Attempt) Date: 02/26/24  Attempted to reach the patient regarding the most recent Inpatient/ED visit.  Follow Up Plan: Additional outreach attempts will be made to reach the patient to complete the Transitions of Care (Post Inpatient/ED visit) call.   Shona Prow RN, CCM [place]  VBCI-Population Health RN Care Manager [phone]  "

## 2024-02-26 NOTE — Telephone Encounter (Signed)
 Pt called VAD Clinic yesterday at 11:40am stating she was having delays with transportation and they could pick her up in 20 minutes. Appointment rescheduled to accommodate transportation delay. Pt called at 1:43pm to check status with no answer. Pt no showed appt and VAD Coordinator unable to reach pt. VM left. Received message from [employer] reporting HHRN unable to contact pt for initial visit. Letter sent to address on file.   [name] Lunger RN, BSN VAD Coordinator 24/7 Pager [phone]

## 2024-02-26 NOTE — Progress Notes (Deleted)
 Pt called VAD Clinic yesterday at 11:40am stating she was having delays with transportation and they could pick her up in 20 minutes. Appointment rescheduled to accommodate transportation delay. Pt called at 1:43pm to check status with no answer. Pt no showed appt and VAD Coordinator unable to reach pt. VM left. Received message from [employer] reporting HHRN unable to contact pt for initial visit. Letter sent to address on file.   [name] Lunger RN, BSN VAD Coordinator 24/7 Pager [phone]

## 2024-02-26 NOTE — Transitions of Care (Post Inpatient/ED Visit) (Signed)
" ° °  02/26/2024  Name: [name] [name] MRN: [id number] DOB: [birth year]  Today's TOC FU Call Status: Today's TOC FU Call Status:: Unsuccessful Call (3rd Attempt) Unsuccessful Call (3rd Attempt) Date: 02/26/24  Attempted to reach the patient regarding the most recent Inpatient/ED visit.  Follow Up Plan: No further outreach attempts will be made at this time. We have been unable to contact the patient.  Shona Prow RN, CCM [place]  VBCI-Population Health RN Care Manager [phone]  "

## 2024-02-27 ENCOUNTER — Ambulatory Visit (HOSPITAL_COMMUNITY)

## 2024-02-27 ENCOUNTER — Encounter (HOSPITAL_COMMUNITY): Payer: Self-pay

## 2024-03-01 ENCOUNTER — Ambulatory Visit (HOSPITAL_COMMUNITY)

## 2024-03-03 ENCOUNTER — Ambulatory Visit (HOSPITAL_COMMUNITY)

## 2024-03-05 ENCOUNTER — Ambulatory Visit (HOSPITAL_COMMUNITY)

## 2024-03-10 ENCOUNTER — Ambulatory Visit: Admitting: Family

## 2024-03-15 ENCOUNTER — Ambulatory Visit (HOSPITAL_COMMUNITY): Admitting: Cardiology

## 2024-03-29 ENCOUNTER — Ambulatory Visit: Payer: Self-pay | Admitting: Infectious Disease

## 2024-04-15 ENCOUNTER — Ambulatory Visit

## 2024-06-15 ENCOUNTER — Ambulatory Visit: Admitting: Physician Assistant
